# Patient Record
Sex: Male | Born: 1950 | Race: White | Hispanic: No | Marital: Single | Smoking: Current every day smoker
Health system: Southern US, Community
[De-identification: ages and names within clinical notes are randomized; demographics above are authoritative.]

## PROBLEM LIST (undated history)

## (undated) DIAGNOSIS — K766 Portal hypertension: Secondary | ICD-10-CM

## (undated) DIAGNOSIS — I779 Disorder of arteries and arterioles, unspecified: Secondary | ICD-10-CM

## (undated) DIAGNOSIS — Z5900 Homelessness unspecified: Secondary | ICD-10-CM

## (undated) DIAGNOSIS — G459 Transient cerebral ischemic attack, unspecified: Secondary | ICD-10-CM

## (undated) DIAGNOSIS — F4310 Post-traumatic stress disorder, unspecified: Secondary | ICD-10-CM

## (undated) DIAGNOSIS — I272 Pulmonary hypertension, unspecified: Secondary | ICD-10-CM

## (undated) DIAGNOSIS — I700 Atherosclerosis of aorta: Secondary | ICD-10-CM

## (undated) DIAGNOSIS — R001 Bradycardia, unspecified: Secondary | ICD-10-CM

## (undated) DIAGNOSIS — K7460 Unspecified cirrhosis of liver: Secondary | ICD-10-CM

## (undated) DIAGNOSIS — E119 Type 2 diabetes mellitus without complications: Secondary | ICD-10-CM

## (undated) DIAGNOSIS — E785 Hyperlipidemia, unspecified: Secondary | ICD-10-CM

## (undated) DIAGNOSIS — K439 Ventral hernia without obstruction or gangrene: Secondary | ICD-10-CM

## (undated) DIAGNOSIS — R601 Generalized edema: Secondary | ICD-10-CM

## (undated) DIAGNOSIS — G8929 Other chronic pain: Secondary | ICD-10-CM

## (undated) DIAGNOSIS — E669 Obesity, unspecified: Secondary | ICD-10-CM

## (undated) DIAGNOSIS — J45909 Unspecified asthma, uncomplicated: Secondary | ICD-10-CM

## (undated) DIAGNOSIS — R296 Repeated falls: Secondary | ICD-10-CM

## (undated) DIAGNOSIS — I5030 Unspecified diastolic (congestive) heart failure: Secondary | ICD-10-CM

## (undated) DIAGNOSIS — J449 Chronic obstructive pulmonary disease, unspecified: Secondary | ICD-10-CM

## (undated) DIAGNOSIS — M51369 Other intervertebral disc degeneration, lumbar region without mention of lumbar back pain or lower extremity pain: Secondary | ICD-10-CM

## (undated) DIAGNOSIS — R079 Chest pain, unspecified: Secondary | ICD-10-CM

## (undated) DIAGNOSIS — R45851 Suicidal ideations: Secondary | ICD-10-CM

## (undated) DIAGNOSIS — I2510 Atherosclerotic heart disease of native coronary artery without angina pectoris: Secondary | ICD-10-CM

## (undated) DIAGNOSIS — K219 Gastro-esophageal reflux disease without esophagitis: Secondary | ICD-10-CM

## (undated) DIAGNOSIS — B1920 Unspecified viral hepatitis C without hepatic coma: Secondary | ICD-10-CM

## (undated) DIAGNOSIS — G43909 Migraine, unspecified, not intractable, without status migrainosus: Secondary | ICD-10-CM

## (undated) DIAGNOSIS — I10 Essential (primary) hypertension: Secondary | ICD-10-CM

## (undated) DIAGNOSIS — D649 Anemia, unspecified: Secondary | ICD-10-CM

## (undated) DIAGNOSIS — M5412 Radiculopathy, cervical region: Secondary | ICD-10-CM

## (undated) DIAGNOSIS — F32A Depression, unspecified: Secondary | ICD-10-CM

## (undated) DIAGNOSIS — G4730 Sleep apnea, unspecified: Secondary | ICD-10-CM

## (undated) DIAGNOSIS — D696 Thrombocytopenia, unspecified: Secondary | ICD-10-CM

## (undated) DIAGNOSIS — B888 Other specified infestations: Secondary | ICD-10-CM

## (undated) DIAGNOSIS — M1990 Unspecified osteoarthritis, unspecified site: Secondary | ICD-10-CM

## (undated) DIAGNOSIS — Z765 Malingerer [conscious simulation]: Secondary | ICD-10-CM

## (undated) DIAGNOSIS — B1910 Unspecified viral hepatitis B without hepatic coma: Secondary | ICD-10-CM

## (undated) DIAGNOSIS — N200 Calculus of kidney: Secondary | ICD-10-CM

## (undated) DIAGNOSIS — G992 Myelopathy in diseases classified elsewhere: Secondary | ICD-10-CM

## (undated) DIAGNOSIS — I509 Heart failure, unspecified: Secondary | ICD-10-CM

## (undated) DIAGNOSIS — F419 Anxiety disorder, unspecified: Secondary | ICD-10-CM

## (undated) DIAGNOSIS — K838 Other specified diseases of biliary tract: Secondary | ICD-10-CM

## (undated) DIAGNOSIS — M4802 Spinal stenosis, cervical region: Secondary | ICD-10-CM

## (undated) HISTORY — PX: CHOLECYSTECTOMY: SHX55

## (undated) HISTORY — PX: ANTERIOR CERVICAL DISCECTOMY: SHX1160

## (undated) HISTORY — PX: CARDIAC CATHETERIZATION: SHX172

## (undated) HISTORY — DX: Unspecified viral hepatitis C without hepatic coma: B19.20

## (undated) HISTORY — DX: Post-traumatic stress disorder, unspecified: F43.10

## (undated) HISTORY — DX: Repeated falls: R29.6

## (undated) HISTORY — DX: Unspecified viral hepatitis B without hepatic coma: B19.10

## (undated) HISTORY — DX: Type 2 diabetes mellitus without complications: E11.9

## (undated) HISTORY — DX: Depression, unspecified: F32.A

## (undated) HISTORY — DX: Suicidal ideations: R45.851

## (undated) HISTORY — DX: Anxiety disorder, unspecified: F41.9

## (undated) HISTORY — DX: Unspecified cirrhosis of liver: K74.60

## (undated) HISTORY — DX: Other chronic pain: G89.29

## (undated) HISTORY — DX: Bradycardia, unspecified: R00.1

## (undated) HISTORY — DX: Radiculopathy, cervical region: M54.12

## (undated) HISTORY — DX: Unspecified diastolic (congestive) heart failure: I50.30

## (undated) HISTORY — PX: INNER EAR SURGERY: SHX679

## (undated) HISTORY — PX: EYE SURGERY: SHX253

## (undated) HISTORY — PX: NOSE SURGERY: SHX723

## (undated) HISTORY — DX: Chest pain, unspecified: R07.9

## (undated) HISTORY — PX: APPENDECTOMY: SHX54

---

## 2006-10-21 DIAGNOSIS — I2510 Atherosclerotic heart disease of native coronary artery without angina pectoris: Secondary | ICD-10-CM

## 2006-10-21 HISTORY — DX: Atherosclerotic heart disease of native coronary artery without angina pectoris: I25.10

## 2017-12-16 ENCOUNTER — Emergency Department
Admission: EM | Admit: 2017-12-16 | Discharge: 2017-12-16 | Disposition: A | Discharge status: Home / Self Care | Payer: Medicare Other | Attending: Emergency Medicine | Admitting: Emergency Medicine

## 2017-12-16 ENCOUNTER — Encounter: Payer: Self-pay | Admitting: Emergency Medicine

## 2017-12-16 ENCOUNTER — Other Ambulatory Visit: Payer: Self-pay

## 2017-12-16 ENCOUNTER — Emergency Department: Payer: Medicare Other

## 2017-12-16 DIAGNOSIS — R600 Localized edema: Secondary | ICD-10-CM | POA: Diagnosis not present

## 2017-12-16 DIAGNOSIS — I509 Heart failure, unspecified: Secondary | ICD-10-CM | POA: Insufficient documentation

## 2017-12-16 DIAGNOSIS — R609 Edema, unspecified: Secondary | ICD-10-CM

## 2017-12-16 DIAGNOSIS — Z5900 Homelessness unspecified: Secondary | ICD-10-CM

## 2017-12-16 DIAGNOSIS — F4323 Adjustment disorder with mixed anxiety and depressed mood: Secondary | ICD-10-CM

## 2017-12-16 DIAGNOSIS — Z5902 Unsheltered homelessness: Secondary | ICD-10-CM

## 2017-12-16 DIAGNOSIS — R2240 Localized swelling, mass and lump, unspecified lower limb: Secondary | ICD-10-CM | POA: Diagnosis present

## 2017-12-16 HISTORY — DX: Heart failure, unspecified: I50.9

## 2017-12-16 LAB — TROPONIN I: Troponin I: <0.03 ng/mL (ref <0.03)

## 2017-12-16 LAB — BASIC METABOLIC PANEL
Anion gap: 7 (ref 5–15)
BUN: 16 mg/dL (ref 6–20)
CHLORIDE: 105 mmol/L (ref 101–111)
CO2: 25 mmol/L (ref 22–32)
Calcium: 8.8 mg/dL — ABNORMAL LOW (ref 8.9–10.3)
Creatinine, Ser: 1.29 mg/dL — ABNORMAL HIGH (ref 0.61–1.24)
GFR, EST NON AFRICAN AMERICAN: 55 mL/min — AB (ref >60)
Glucose, Bld: 107 mg/dL — ABNORMAL HIGH (ref 65–99)
POTASSIUM: 3.2 mmol/L — AB (ref 3.5–5.1)
SODIUM: 137 mmol/L (ref 135–145)

## 2017-12-16 LAB — CBC
HEMATOCRIT: 33.1 % — AB (ref 40.0–52.0)
HEMOGLOBIN: 11.3 g/dL — AB (ref 13.0–18.0)
MCH: 30.9 pg (ref 26.0–34.0)
MCHC: 34.2 g/dL (ref 32.0–36.0)
MCV: 90.3 fL (ref 80.0–100.0)
Platelets: 125 10*3/uL — ABNORMAL LOW (ref 150–440)
RBC: 3.66 MIL/uL — AB (ref 4.40–5.90)
RDW: 14.5 % (ref 11.5–14.5)
WBC: 4.4 10*3/uL (ref 3.8–10.6)

## 2017-12-16 LAB — BRAIN NATRIURETIC PEPTIDE: B NATRIURETIC PEPTIDE 5: 202 pg/mL — AB (ref 0.0–100.0)

## 2017-12-16 MED ORDER — POTASSIUM CHLORIDE ER 20 MEQ PO TBCR: 10.0000 meq | EXTENDED_RELEASE_TABLET | Freq: Every day | ORAL | 0 refills | Status: DC

## 2017-12-16 MED ORDER — FUROSEMIDE 40 MG PO TABS: 40.0000 mg | ORAL_TABLET | Freq: Every day | ORAL | 0 refills | Status: DC

## 2017-12-16 MED ORDER — FUROSEMIDE 40 MG PO TABS
40.0000 mg | ORAL_TABLET | Freq: Once | ORAL | Status: AC
  Administered 2017-12-16: 40 mg via ORAL
  Filled 2017-12-16: qty 1

## 2017-12-16 NOTE — ED Notes (Signed)
RN walked with pt to lobby to show pt where the phone was located. Pt will call friend who brought him to the ED. Pt in NAD at this time. Pt refused wheelchair and reported, "If they are going to make me leave I will tough it out and walk"

## 2017-12-16 NOTE — Clinical Social Work Note (Signed)
CSW received consult for "ed." CSW staffed with EDP Dr. Burlene Arnt. This CSW and CSW Candace met with patient at bedside. Patient recently released from prison after serving 9-10 years and states he has been living on the streets since. Patient states his parents, his first wife, and most of his friends are deceased. Patient states he has a daughter who has gotten married and he cannot find. Patient states his parole officer is Officer Reed, [place] [phone]. [phone] / [phone]). Patient states he has no income, no insurance, and no PCP. Patient states he cannot stay at the shelter because he is a felon. Patient states officer Verdene Rio was assisting patient with getting into [facility] until he gets his SSI reinstated. Patient states he has a friend that he cannot stay with, but visits with sometimes. CSW discussed plan for patient to reinstate SSI by going to the [employer] on [address]. CSW also discussed patient going to Social Services to apply for food stamps. CSW provided an application for [employer]. CSW provided local resources including addresses and contact information for SSA and Social Services. CSW also provided patient with a elderly resources brochure, [place] transit map, [place] and [place] list, and free meals information in [place]. Patient began providing excuses of why he cannot go to [facility], or [facility] to include he has a curfew and has to be in by 6pm, he cannot leave the county, and he can hardly walk. CSW explained to patient that, per EDP, he would not be admitted to hospital and would be discharging today. Patient then stated he would cut his wrists because he needs to be back in the inpatient [facility]. CSW updated RN Larene Beach and EDP Dr. Burlene Arnt of this information, who put in psych consult. Patient has resources. CSW signing off as no further Social Work needs identified.   Oretha Ellis,  Latanya Presser, [place] Social Worker-ED [phone]

## 2017-12-16 NOTE — ED Provider Notes (Addendum)
[facility] Emergency Department Provider Note  ____________________________________________   I have reviewed the triage vital signs and the nursing notes. Where available I have reviewed prior notes and, if possible and indicated, outside hospital notes.    HISTORY  Chief Complaint Leg Swelling    HPI [name] is a 67 y.o. male history of CHF, states he is compliant with his medication takes 40 of Lasix a day, states he has had shortness of breath when laying flat chronically, also states he has had lower extremity swelling for 6 months.  Patient recently discharged from jail.  He has no vertigo.  He is not allowed apparently into the shelter because he is a felon.  He has been sleeping rough he states.  He wants to be admitted to the hospital.  He states that his leg swelling is not significantly different from baseline although may be somewhat increased.  He denies any chest pain or nausea or vomiting.  He denies exertional symptoms.    Past Medical History:  Diagnosis Date  . CHF (congestive heart failure) ([place])     There are no active problems to display for this patient.   History reviewed. No pertinent surgical history.  Prior to Admission medications   Not on File    Allergies Sulfa antibiotics  No family history on file.  Social History Social History   Tobacco Use  . Smoking status: Unknown If Ever Smoked  . Smokeless tobacco: Never Used  Substance Use Topics  . Alcohol use: Not Currently  . Drug use: Not Currently    Review of Systems Constitutional: No fever/chills Eyes: No visual changes. ENT: No sore throat. No stiff neck no neck pain Cardiovascular: Denies chest pain. Respiratory: Denies shortness of breath. Gastrointestinal:   no vomiting.  No diarrhea.  No constipation. Genitourinary: Negative for dysuria. Musculoskeletal: + lower extremity swelling Skin: Negative for rash. Neurological: Negative for severe  headaches, focal weakness or numbness.   ____________________________________________   PHYSICAL EXAM:  VITAL SIGNS: ED Triage Vitals  Enc Vitals Group     BP 12/16/17 1000 (!) 132/55     Pulse Rate 12/16/17 1000 61     Resp 12/16/17 1000 20     Temp 12/16/17 1000 98.4 F (36.9 C)     Temp Source 12/16/17 1000 Oral     SpO2 12/16/17 1000 99 %     Weight 12/16/17 1002 228 lb (103.4 kg)     Height 12/16/17 1002 5\' 6"  (1.676 m)     Head Circumference --      Peak Flow --      Pain Score --      Pain Loc --      Pain Edu? --      Excl. in [place]? --     Constitutional: Alert and oriented. Well appearing and in no acute distress. Eyes: Conjunctivae are normal Head: Atraumatic HEENT: No congestion/rhinnorhea. Mucous membranes are moist.  Oropharynx non-erythematous Neck:   Nontender with no meningismus, no masses, no stridor Cardiovascular: Normal rate, regular rhythm. Grossly normal heart sounds.  Good peripheral circulation. Respiratory: Normal respiratory effort.  No retractions. Lungs CTAB. Abdominal: Soft and nontender. No distention. No guarding no rebound Back:  There is no focal tenderness or step off.  there is no midline tenderness there are no lesions noted. there is no CVA tenderness Musculoskeletal: No lower extremity tenderness, no upper extremity tenderness. No joint effusions, no DVT signs strong distal pulses chronic appearing bilateral  lower extremity edema Neurologic:  Normal speech and language. No gross focal neurologic deficits are appreciated.  Skin:  Skin is warm, dry and intact. No rash noted. Psychiatric: Mood and affect are normal. Speech and behavior are normal.  ____________________________________________   LABS (all labs ordered are listed, but only abnormal results are displayed)  Labs Reviewed  BASIC METABOLIC PANEL - Abnormal; Notable for the following components:      Result Value   Potassium 3.2 (*)    Glucose, Bld 107 (*)    Creatinine,  Ser 1.29 (*)    Calcium 8.8 (*)    GFR calc non Af Amer 55 (*)    All other components within normal limits  CBC - Abnormal; Notable for the following components:   RBC 3.66 (*)    Hemoglobin 11.3 (*)    HCT 33.1 (*)    Platelets 125 (*)    All other components within normal limits  BRAIN NATRIURETIC PEPTIDE - Abnormal; Notable for the following components:   B Natriuretic Peptide 202.0 (*)    All other components within normal limits  TROPONIN I    Pertinent labs  results that were available during my care of the patient were reviewed by me and considered in my medical decision making (see chart for details). ____________________________________________  EKG  I personally interpreted any EKGs ordered by me or triage And his rhythm at 62 bpm no acute ischemic changes normal axis ____________________________________________  RADIOLOGY  Pertinent labs & imaging results that were available during my care of the patient were reviewed by me and considered in my medical decision making (see chart for details). If possible, patient and/or family made aware of any abnormal findings.  Dg Chest 2 View  Result Date: 12/16/2017 CLINICAL DATA:  Shortness of breath EXAM: CHEST - 2 VIEW COMPARISON:  None. FINDINGS: Lungs are clear. Heart size and pulmonary vascularity are normal. No adenopathy. There is mild degenerative change in the thoracic spine. IMPRESSION: No edema or consolidation. Electronically Signed   By: Lowella Grip III M.D.   On: 12/16/2017 09:47   ____________________________________________    PROCEDURES  Procedure(s) performed: None  Procedures  Critical Care performed: None  ____________________________________________   INITIAL IMPRESSION / ASSESSMENT AND PLAN / ED COURSE  Pertinent labs & imaging results that were available during my care of the patient were reviewed by me and considered in my medical decision making (see chart for details).  Patient here  for chronic leg swelling, does not appear to be acutely ill, seems to mostly be interested in finding a place to stay, we will give him Lasix for his chronic lower extremity edema but chest x-ray, blood work, EKG etc. do not denote any acute pathology today.  We will have social work work with him on finding somewhere to go he does have all of his medications.  ----------------------------------------- 3:22 PM on 12/16/2017 -----------------------------------------  Social work is working on referring patients to places he can stay, no indication at this time for acute admission to the hospital, we have advised him to use extra Lasix and will prescribe him with a prescription for said in case he runs out, outpatient follow-up and return precautions given and understood.  ----------------------------------------- 3:52 PM on 12/16/2017 -----------------------------------------  Being informed by social work that he cannot stay in the hospital without clear medical indication, patient told him apparently that he would harm himself if we did not let him stay here.  This is not likely to be  a real threat, patient seems to be manipulating ascended forcing himself into the hospital so he does not have to be discharged to shelter or with another outpatient facility he wishes to go to, we will see if psychiatry can evaluate him quickly prior to discharge to see if there is any legitimacy to this threat which does appear to be purely manipulative, he had no SI until he was informed that he was not going to be admitted to the hospital  ----------------------------------------- 4:50 PM on 12/16/2017 -----------------------------------------  Pt assessed by dr. Weber Cooks of psychiatry who feels he is safe for d.c.     ____________________________________________   FINAL CLINICAL IMPRESSION(S) / ED DIAGNOSES  Final diagnoses:  None      This chart was dictated using voice recognition software.   Despite best efforts to proofread,  errors can occur which can change meaning.      Schuyler Amor, MD 12/16/17 1402    Schuyler Amor, MD 12/16/17 1950    Schuyler Amor, MD 12/16/17 1553    Schuyler Amor, MD 12/16/17 1650

## 2017-12-16 NOTE — Discharge Instructions (Signed)
Please take an extra Lasix pill every other day until you reduce your lower extremity swelling.  If you have chest pain or shortness of breath or other new or worrisome symptoms return to the emergency department.  Will closely with primary care doctor, and cardiology

## 2017-12-16 NOTE — ED Notes (Signed)
Pt has all cloths and green book bag with all medications. Pt verbalized having all belongings at time of discharge. No belongings left in room upon assessment.

## 2017-12-16 NOTE — ED Triage Notes (Addendum)
Pt to ed with c/o increased swelling in bilat lower ext and up into abd x 6 months. Also reports sob worse with laying flat.

## 2017-12-16 NOTE — Consult Note (Signed)
[facility] Face-to-Face Psychiatry Consult   Reason for Consult: Consult for 67 year old man who came to the hospital for peripheral edema but later made statements implying suicidality. Referring Physician: Burlene Arnt Patient Identification: [name] MRN:  [id number] Principal Diagnosis: Adjustment disorder with mixed anxiety and depressed mood Diagnosis:   Patient Active Problem List   Diagnosis Date Noted  . Adjustment disorder with mixed anxiety and depressed mood [F43.23] 12/16/2017  . Homelessness [Z59.0] 12/16/2017    Total Time spent with patient: 1 hour  Subjective:   [name] is a 67 y.o. male patient admitted with "the system is rigged against me".  HPI: Patient interviewed chart reviewed.  Spoke with emergency room physician.  This 67 year old man who was recently released from prison came to the emergency room today complaining of peripheral edema.  This was assessed and treatment plans were made by the emergency room physician who did not feel that the patient needed inpatient hospitalization.  Reportedly the patient then declared that he was homeless and requested assistance from social work.  When social work provided what assistance they could and also determined that there was no need for the patient to be kept in the hospital the patient reportedly said that he would kill himself.  On interview with me the patient talks at great length about the hardships he has suffered since being released from prison.  He has no family and no place to stay.  He has been staying with a friend for a few days but also staying outdoors at times.  He has lacked any transportation to get to the [employer] office.  Lacks any income.  Patient indicates that his mood stays irritated most of the time.  Difficulty sleeping because of his stress.  Patient talks about at times having thoughts of cutting his wrists but clearly indicates that this would just be done to protest how angry he is at the  injustice of the system.  Does not indicate that he actually wants to die.  Does not indicate any suicidal ideas.  Social history: Patient just out of prison after what sounds like about 9 years.  He indicates that his wife and daughter died while he was in prison and he has no family.  Medical history: History of congestive heart failure.  Substance abuse history: Denies any drug use or alcohol use     Past Psychiatric History: Patient reports that prior to going to prison he had seen an outpatient psychiatrist and had had hospitalizations for what he terms "nervous breakdowns".  Cannot remember what medicines he took.  Denies any history of suicide attempts or violence.  Denies any history of psychosis.  States that he was not treated for any mental health problems while in prison and even though he says he has panic attacks and PTSD  Risk to Self: Is patient at risk for suicide?: No Risk to Others:   Prior Inpatient Therapy:   Prior Outpatient Therapy:    Past Medical History:  Past Medical History:  Diagnosis Date  . CHF (congestive heart failure) ([place])    History reviewed. No pertinent surgical history. Family History: No family history on file. Family Psychiatric  History: No known family history Social History:  Social History   Substance and Sexual Activity  Alcohol Use Not Currently     Social History   Substance and Sexual Activity  Drug Use Not Currently    Social History   Socioeconomic History  . Marital status: Single  Spouse name: Not on file  . Number of children: Not on file  . Years of education: Not on file  . Highest education level: Not on file  Occupational History  . Not on file  Social Needs  . Financial resource strain: Not on file  . Food insecurity:    Worry: Not on file    Inability: Not on file  . Transportation needs:    Medical: Not on file    Non-medical: Not on file  Tobacco Use  . Smoking status: Unknown If Ever Smoked  .  Smokeless tobacco: Never Used  Substance and Sexual Activity  . Alcohol use: Not Currently  . Drug use: Not Currently  . Sexual activity: Not on file  Lifestyle  . Physical activity:    Days per week: Not on file    Minutes per session: Not on file  . Stress: Not on file  Relationships  . Social connections:    Talks on phone: Not on file    Gets together: Not on file    Attends religious service: Not on file    Active member of club or organization: Not on file    Attends meetings of clubs or organizations: Not on file    Relationship status: Not on file  Other Topics Concern  . Not on file  Social History Narrative  . Not on file   Additional Social History:    Allergies:   Allergies  Allergen Reactions  . Sulfa Antibiotics     Labs:  Results for orders placed or performed during the hospital encounter of 12/16/17 (from the past 48 hour(s))  Basic metabolic panel     Status: Abnormal   Collection Time: 12/16/17 10:06 AM  Result Value Ref Range   Sodium 137 135 - 145 mmol/L   Potassium 3.2 (L) 3.5 - 5.1 mmol/L   Chloride 105 101 - 111 mmol/L   CO2 25 22 - 32 mmol/L   Glucose, Bld 107 (H) 65 - 99 mg/dL   BUN 16 6 - 20 mg/dL   Creatinine, Ser 1.29 (H) 0.61 - 1.24 mg/dL   Calcium 8.8 (L) 8.9 - 10.3 mg/dL   GFR calc non Af Amer 55 (L) >60 mL/min   GFR calc Af Amer >60 >60 mL/min    Comment: (NOTE) The eGFR has been calculated using the CKD EPI equation. This calculation has not been validated in all clinical situations. eGFR's persistently <60 mL/min signify possible Chronic Kidney Disease.    Anion gap 7 5 - 15    Comment: Performed at [facility], [address] [place], [place] [address]  CBC     Status: Abnormal   Collection Time: 12/16/17 10:06 AM  Result Value Ref Range   WBC 4.4 3.8 - 10.6 K/uL   RBC 3.66 (L) 4.40 - 5.90 MIL/uL   Hemoglobin 11.3 (L) 13.0 - 18.0 g/dL   HCT 33.1 (L) 40.0 - 52.0 %   MCV 90.3 80.0 - 100.0 fL   MCH 30.9 26.0 -  34.0 pg   MCHC 34.2 32.0 - 36.0 g/dL   RDW 14.5 11.5 - 14.5 %   Platelets 125 (L) 150 - 440 K/uL    Comment: Performed at [facility], [address] [place], [place] [address]  Troponin I     Status: None   Collection Time: 12/16/17 10:06 AM  Result Value Ref Range   Troponin I <0.03 <0.03 ng/mL    Comment: Performed at [facility], [place], [place] [address]  Brain natriuretic peptide     Status: Abnormal   Collection Time: 12/16/17 10:06 AM  Result Value Ref Range   B Natriuretic Peptide 202.0 (H) 0.0 - 100.0 pg/mL    Comment: Performed at [facility], [address] [place], [place] [address]    No current facility-administered medications for this encounter.    Current Outpatient Medications  Medication Sig Dispense Refill  . furosemide (LASIX) 40 MG tablet Take 1 tablet (40 mg total) by mouth daily. 30 tablet 0  . potassium chloride 20 MEQ TBCR Take 10 mEq by mouth daily. 10 tablet 0    Musculoskeletal: Strength & Muscle Tone: within normal limits Gait & Station: normal Patient leans: N/A  Psychiatric Specialty Exam: Physical Exam  Nursing note and vitals reviewed. Constitutional: He appears well-developed and well-nourished.  HENT:  Head: Normocephalic and atraumatic.  Eyes: Pupils are equal, round, and reactive to light. Conjunctivae are normal.  Neck: Normal range of motion.  Cardiovascular: Regular rhythm and normal heart sounds.  Respiratory: Effort normal. No respiratory distress.  GI: Soft.  Musculoskeletal: Normal range of motion.  Neurological: He is alert.  Skin: Skin is warm and dry.  Psychiatric: He has a normal mood and affect. His speech is normal and behavior is normal. Thought content normal. His affect is not blunt. Cognition and memory are normal. He expresses impulsivity.    Review of Systems  Constitutional: Negative.   HENT: Negative.   Eyes: Negative.   Respiratory: Negative.    Cardiovascular: Negative.   Gastrointestinal: Negative.   Musculoskeletal: Negative.   Skin: Negative.   Neurological: Negative.   Psychiatric/Behavioral: Positive for depression. Negative for hallucinations, memory loss, substance abuse and suicidal ideas. The patient is nervous/anxious. The patient does not have insomnia.     Blood pressure (!) 126/52, pulse 65, temperature 98.4 F (36.9 C), temperature source Oral, resp. rate 16, height '5\' 6"'  (1.676 m), weight 103.4 kg (228 lb), SpO2 98 %.Body mass index is 36.8 kg/m.  General Appearance: Casual  Eye Contact:  Good  Speech:  Clear and Coherent  Volume:  Normal  Mood:  Anxious and Dysphoric  Affect:  Full Range  Thought Process:  Goal Directed  Orientation:  Full (Time, Place, and Person)  Thought Content:  Logical  Suicidal Thoughts:  No  Homicidal Thoughts:  No  Memory:  Immediate;   Fair Recent;   Fair Remote;   Fair  Judgement:  Fair  Insight:  Fair  Psychomotor Activity:  Decreased  Concentration:  Concentration: Fair  Recall:  [employer] of Knowledge:  Fair  Language:  Fair  Akathisia:  No  Handed:  Right  AIMS (if indicated):     Assets:  Desire for Improvement  ADL's:  Intact  Cognition:  WNL  Sleep:        Treatment Plan Summary: Plan 67 year old man who reports a history of anxiety symptoms.  Today in the emergency room he only began talking about psychiatric symptoms after it had been made clear to him that he would not be admitted to the hospital.  Patient is clear in his history that he feels he needs to be in the hospital because he has no place to stay.  Although he does claim to have symptoms of anxiety and depression he does not indicate that he feels hopeless to the point of wanting to actually kill himself.  No evidence of psychosis.  Patient does not  appear to need inpatient psychiatric hospitalization.  He is aware of mental health resources in the community and will be referred to follow-up if  needed outside the hospital.  As far as the issue of requiring further stay here I do not think he needs inpatient psychiatric treatment.  Disposition: No evidence of imminent risk to self or others at present.   Patient does not meet criteria for psychiatric inpatient admission. Supportive therapy provided about ongoing stressors. Discussed crisis plan, support from social network, calling 911, coming to the Emergency Department, and calling Suicide Hotline.  Alethia Berthold, MD 12/16/2017 5:49 PM

## 2017-12-25 ENCOUNTER — Emergency Department: Payer: Medicare Other

## 2017-12-25 ENCOUNTER — Other Ambulatory Visit: Payer: Self-pay

## 2017-12-25 ENCOUNTER — Emergency Department
Admission: EM | Admit: 2017-12-25 | Discharge: 2017-12-26 | Disposition: A | Discharge status: Home / Self Care | Payer: Medicare Other | Attending: Emergency Medicine | Admitting: Emergency Medicine

## 2017-12-25 DIAGNOSIS — Z590 Homelessness: Secondary | ICD-10-CM | POA: Diagnosis not present

## 2017-12-25 DIAGNOSIS — Y999 Unspecified external cause status: Secondary | ICD-10-CM | POA: Insufficient documentation

## 2017-12-25 DIAGNOSIS — S8002XA Contusion of left knee, initial encounter: Secondary | ICD-10-CM | POA: Insufficient documentation

## 2017-12-25 DIAGNOSIS — Y92513 Shop (commercial) as the place of occurrence of the external cause: Secondary | ICD-10-CM | POA: Insufficient documentation

## 2017-12-25 DIAGNOSIS — Y9389 Activity, other specified: Secondary | ICD-10-CM | POA: Insufficient documentation

## 2017-12-25 DIAGNOSIS — S8992XA Unspecified injury of left lower leg, initial encounter: Secondary | ICD-10-CM | POA: Diagnosis present

## 2017-12-25 DIAGNOSIS — R55 Syncope and collapse: Secondary | ICD-10-CM | POA: Diagnosis not present

## 2017-12-25 DIAGNOSIS — R079 Chest pain, unspecified: Secondary | ICD-10-CM | POA: Insufficient documentation

## 2017-12-25 DIAGNOSIS — X58XXXA Exposure to other specified factors, initial encounter: Secondary | ICD-10-CM | POA: Diagnosis not present

## 2017-12-25 DIAGNOSIS — I509 Heart failure, unspecified: Secondary | ICD-10-CM | POA: Insufficient documentation

## 2017-12-25 LAB — BASIC METABOLIC PANEL
Anion gap: 5 (ref 5–15)
BUN: 23 mg/dL — ABNORMAL HIGH (ref 6–20)
CALCIUM: 9 mg/dL (ref 8.9–10.3)
CO2: 26 mmol/L (ref 22–32)
CREATININE: 1.13 mg/dL (ref 0.61–1.24)
Chloride: 111 mmol/L (ref 101–111)
GFR calc Af Amer: >60 mL/min (ref >60)
GFR calc non Af Amer: >60 mL/min (ref >60)
GLUCOSE: 130 mg/dL — AB (ref 65–99)
Potassium: 3.4 mmol/L — ABNORMAL LOW (ref 3.5–5.1)
Sodium: 142 mmol/L (ref 135–145)

## 2017-12-25 LAB — TROPONIN I: Troponin I: <0.03 ng/mL

## 2017-12-25 LAB — CBC WITH DIFFERENTIAL/PLATELET
Basophils Absolute: 0.1 10*3/uL (ref 0–0.1)
Basophils Relative: 2 %
Eosinophils Absolute: 0.1 10*3/uL (ref 0–0.7)
Eosinophils Relative: 3 %
HCT: 31.5 % — ABNORMAL LOW (ref 40.0–52.0)
Hemoglobin: 10.4 g/dL — ABNORMAL LOW (ref 13.0–18.0)
Lymphocytes Relative: 27 %
Lymphs Abs: 1 10*3/uL (ref 1.0–3.6)
MCH: 30 pg (ref 26.0–34.0)
MCHC: 32.9 g/dL (ref 32.0–36.0)
MCV: 91.2 fL (ref 80.0–100.0)
Monocytes Absolute: 0.6 10*3/uL (ref 0.2–1.0)
Monocytes Relative: 18 %
Neutro Abs: 1.8 10*3/uL (ref 1.4–6.5)
Neutrophils Relative %: 50 %
Platelets: 119 10*3/uL — ABNORMAL LOW (ref 150–440)
RBC: 3.45 MIL/uL — ABNORMAL LOW (ref 4.40–5.90)
RDW: 14.7 % — ABNORMAL HIGH (ref 11.5–14.5)
WBC: 3.6 10*3/uL — ABNORMAL LOW (ref 3.8–10.6)

## 2017-12-25 NOTE — Discharge Instructions (Addendum)
Take all medicines as directed by your doctor.  Return to the ER for worsening symptoms, persistent vomiting, difficulty breathing or other concerns.

## 2017-12-25 NOTE — ED Triage Notes (Signed)
Pt arrived via ems for c/o chest pain - pt is homeless and has been without food and shelter for the past few days - pt is c/o chest pain that feels like pressure and the pain radiates into left arm and left jaw - denies shortness of breath - pt took 2 ntg prior to ems arrival and ems gave 1 ntg and 324 mg of asa - no relief from any intervention - Dr Clearnce Hasten at bedside

## 2017-12-25 NOTE — ED Provider Notes (Signed)
[facility] Emergency Department Provider Note  ___________________________________________   First MD Initiated Contact with Patient 12/25/17 2155     (approximate)  I have reviewed the triage vital signs and the nursing notes.   HISTORY  Chief Complaint Chest Pain   HPI [name] is a 67 y.o. male with a history of CHF who is homeless was presenting to the emergency department today after a near syncopal episode.  He says that he was in a [employer] store when he began to feel weak and lightheaded.  This was associated with a pressure-like sensation in his chest radiating up to his neck.  He took 2 of his own nitro tabs and was given a third nitro by EMS without relief of the pressure sensation.  Also given 324 mg of aspirin.  Patient says that he was taken off his medication which include a blood pressure medication and Lasix up until yesterday when he lost his medications.  He says that he has had a history of cardiac catheterization but is unsure if he was stented.  He thinks that they may be have done a balloon angioplasty but is unable to give exact details.  Denying any shortness of breath at this time.  Says that he was having diarrhea yesterday but none today.  Says that last time it was lunchtime when he ate at home a shelter.  He currently says that he feels like he has pressure in his chest like there is a "sac of flower on my chest."  Patient said that he felt so weak that he fell onto his left knee and is now having anterior left knee pain.  Normally walks with a cane because of dizziness from vertigo and usually takes meclizine for the symptoms.  Past Medical History:  Diagnosis Date  . CHF (congestive heart failure) [facility])     Patient Active Problem List   Diagnosis Date Noted  . Adjustment disorder with mixed anxiety and depressed mood 12/16/2017  . Homelessness 12/16/2017    History reviewed. No pertinent surgical history.  Prior to  Admission medications   Medication Sig Start Date End Date Taking? Authorizing Provider  furosemide (LASIX) 40 MG tablet Take 1 tablet (40 mg total) by mouth daily. 12/16/17 12/16/18  Schuyler Amor, MD  potassium chloride 20 MEQ TBCR Take 10 mEq by mouth daily. 12/16/17   Schuyler Amor, MD    Allergies Sulfa antibiotics  No family history on file.  Social History Social History   Tobacco Use  . Smoking status: Unknown If Ever Smoked  . Smokeless tobacco: Never Used  Substance Use Topics  . Alcohol use: Not Currently  . Drug use: Not Currently    Review of Systems  Constitutional: No fever/chills Eyes: No visual changes. ENT: No sore throat. Cardiovascular: As above Respiratory: Denies shortness of breath. Gastrointestinal: No abdominal pain.  No nausea, no vomiting.  No diarrhea.  No constipation. Genitourinary: Negative for dysuria. Musculoskeletal: Negative for back pain. Skin: Negative for rash. Neurological: Negative for headaches, focal weakness or numbness.   ____________________________________________   PHYSICAL EXAM:  VITAL SIGNS: ED Triage Vitals  Enc Vitals Group     BP --      Pulse --      Resp --      Temp --      Temp src --      SpO2 12/25/17 2158 98 %     Weight 12/25/17 2201 234 lb (106.1  kg)     Height 12/25/17 2201 5\' 6"  (1.676 m)     Head Circumference --      Peak Flow --      Pain Score 12/25/17 2200 9     Pain Loc --      Pain Edu? --      Excl. in [place]? --     Constitutional: Alert and oriented. Well appearing and in no acute distress. Eyes: Conjunctivae are normal.  Head: Atraumatic. Nose: No congestion/rhinnorhea. Mouth/Throat: Mucous membranes are moist.  Neck: No stridor.   Cardiovascular: Normal rate, regular rhythm. Grossly normal heart sounds.  Good peripheral circulation with equal and bilateral dorsalis pedis pulses. Respiratory: Normal respiratory effort.  No retractions. Lungs CTAB. Gastrointestinal: Soft and  nontender. No distention. Musculoskeletal: Moderate bilateral lower extremity edema which the patient says is baseline. Neurologic:  Normal speech and language. No gross focal neurologic deficits are appreciated. Skin:  Skin is warm, dry and intact. No rash noted. Psychiatric: Mood and affect are normal. Speech and behavior are normal.  ____________________________________________   LABS (all labs ordered are listed, but only abnormal results are displayed)  Labs Reviewed  CBC WITH DIFFERENTIAL/PLATELET - Abnormal; Notable for the following components:      Result Value   WBC 3.6 (*)    RBC 3.45 (*)    Hemoglobin 10.4 (*)    HCT 31.5 (*)    RDW 14.7 (*)    Platelets 119 (*)    All other components within normal limits  BASIC METABOLIC PANEL - Abnormal; Notable for the following components:   Potassium 3.4 (*)    Glucose, Bld 130 (*)    BUN 23 (*)    All other components within normal limits  TROPONIN I  TROPONIN I   ____________________________________________  EKG  ED ECG REPORT I, Doran Stabler, the attending physician, personally viewed and interpreted this ECG.   Date: 12/25/2017  EKG Time: 2206  Rate: 50  Rhythm: Sinus bradycardia  Axis: Normal  Intervals:none  ST&T Change: No ST segment elevation or depression.  No abnormal T wave inversion.  ____________________________________________  RADIOLOGY  No acute process found on the chest x-ray nor the left knee x-ray. ____________________________________________   PROCEDURES  Procedure(s) performed:   Procedures  Critical Care performed:   ____________________________________________   INITIAL IMPRESSION / ASSESSMENT AND PLAN / ED COURSE  Pertinent labs & imaging results that were available during my care of the patient were reviewed by me and considered in my medical decision making (see chart for details).  Differential diagnosis includes, but is not limited to, ACS, aortic dissection,  pulmonary embolism, cardiac tamponade, pneumothorax, pneumonia, pericarditis, myocarditis, GI-related causes including esophagitis/gastritis, and musculoskeletal chest wall pain.   As part of my medical decision making, I reviewed the following data within the electronic MEDICAL RECORD NUMBER Notes from prior ED visits  ----------------------------------------- 11:13 PM on 12/25/2017 -----------------------------------------  Patient pending repeat troponin at this time.  So far the patient has had a reassuring chest x-ray as well as knee x-ray.  Reassuring initial lab work.  Patient appears to be baseline bradycardic or just over bradycardic in the 60s as per his last result.  Benign appearance at this time.  Patient to be reevaluated by Dr. Beather Arbour and receive a repeat troponin.  Possible discharge to home if work-up reassuring and patient without further complaint.  No acute finding on the knee x-ray.  Likely contusion. ____________________________________________   FINAL CLINICAL IMPRESSION(S) / ED DIAGNOSES  Near syncope.  Left knee pain.  Chest pain.    NEW MEDICATIONS STARTED DURING THIS VISIT:  New Prescriptions   No medications on file     Note:  This document was prepared using Dragon voice recognition software and may include unintentional dictation errors.     Orbie Pyo, MD 12/25/17 [phone]

## 2017-12-26 DIAGNOSIS — S8002XA Contusion of left knee, initial encounter: Secondary | ICD-10-CM | POA: Diagnosis not present

## 2017-12-26 LAB — TROPONIN I: Troponin I: <0.03 ng/mL (ref <0.03)

## 2017-12-26 NOTE — ED Notes (Signed)
NAD noted at time of D/C. Pt taken to the lobby via wheelchair by this RN. At time of D/C. Pt provided with information regarding free/reduced clinics, medication management, a bus pas, a bus route map, and D/C papers. Pt taken to lobby by this RN, courtesy shuttle notified to pick patient up and drop off at bus stop. Santiago Glad, First RN made aware. Pt is alert and oriented, NAD noted.

## 2017-12-26 NOTE — ED Provider Notes (Signed)
-----------------------------------------   1:58 AM on 12/26/2017 -----------------------------------------  Patient sleeping in no acute distress.  Updated him of repeat negative troponin.  Patient is homeless and states the homeless shelter will not take them because he was not present.  Charge nurse will keep him until 6 AM and give him a bus pass.  Strict return precautions given.  Patient verbalizes understanding and agrees with plan of care.   Paulette Blanch, MD 12/26/17 [phone]

## 2017-12-29 ENCOUNTER — Emergency Department: Payer: Medicare Other

## 2017-12-29 ENCOUNTER — Other Ambulatory Visit: Payer: Self-pay

## 2017-12-29 ENCOUNTER — Encounter: Payer: Self-pay | Admitting: *Deleted

## 2017-12-29 ENCOUNTER — Emergency Department
Admission: EM | Admit: 2017-12-29 | Discharge: 2017-12-30 | Disposition: A | Discharge status: Home / Self Care | Payer: Medicare Other | Attending: Emergency Medicine | Admitting: Emergency Medicine

## 2017-12-29 DIAGNOSIS — I509 Heart failure, unspecified: Secondary | ICD-10-CM | POA: Insufficient documentation

## 2017-12-29 DIAGNOSIS — M79604 Pain in right leg: Secondary | ICD-10-CM | POA: Diagnosis not present

## 2017-12-29 DIAGNOSIS — R609 Edema, unspecified: Secondary | ICD-10-CM

## 2017-12-29 DIAGNOSIS — M79605 Pain in left leg: Secondary | ICD-10-CM | POA: Insufficient documentation

## 2017-12-29 DIAGNOSIS — R600 Localized edema: Secondary | ICD-10-CM

## 2017-12-29 DIAGNOSIS — R2243 Localized swelling, mass and lump, lower limb, bilateral: Secondary | ICD-10-CM | POA: Diagnosis present

## 2017-12-29 LAB — COMPREHENSIVE METABOLIC PANEL
ALBUMIN: 3 g/dL — AB (ref 3.5–5.0)
ALT: 26 U/L (ref 17–63)
ANION GAP: 6 (ref 5–15)
AST: 41 U/L (ref 15–41)
Alkaline Phosphatase: 63 U/L (ref 38–126)
BILIRUBIN TOTAL: 0.9 mg/dL (ref 0.3–1.2)
BUN: 17 mg/dL (ref 6–20)
CO2: 25 mmol/L (ref 22–32)
Calcium: 8.5 mg/dL — ABNORMAL LOW (ref 8.9–10.3)
Chloride: 108 mmol/L (ref 101–111)
Creatinine, Ser: 0.89 mg/dL (ref 0.61–1.24)
GFR calc Af Amer: >60 mL/min (ref >60)
GFR calc non Af Amer: >60 mL/min (ref >60)
GLUCOSE: 151 mg/dL — AB (ref 65–99)
POTASSIUM: 3.3 mmol/L — AB (ref 3.5–5.1)
SODIUM: 139 mmol/L (ref 135–145)
TOTAL PROTEIN: 6.4 g/dL — AB (ref 6.5–8.1)

## 2017-12-29 LAB — URINALYSIS, COMPLETE (UACMP) WITH MICROSCOPIC
BILIRUBIN URINE: NEGATIVE
Bacteria, UA: NONE SEEN
GLUCOSE, UA: NEGATIVE mg/dL
HGB URINE DIPSTICK: NEGATIVE
Ketones, ur: NEGATIVE mg/dL
LEUKOCYTES UA: NEGATIVE
NITRITE: NEGATIVE
Protein, ur: 30 mg/dL — AB
SPECIFIC GRAVITY, URINE: 1.025 (ref 1.005–1.030)
pH: 6 (ref 5.0–8.0)

## 2017-12-29 LAB — CBC WITH DIFFERENTIAL/PLATELET
BASOS PCT: 1 %
Basophils Absolute: 0 10*3/uL (ref 0–0.1)
EOS ABS: 0.1 10*3/uL (ref 0–0.7)
Eosinophils Relative: 4 %
HCT: 31.6 % — ABNORMAL LOW (ref 40.0–52.0)
Hemoglobin: 10.7 g/dL — ABNORMAL LOW (ref 13.0–18.0)
Lymphocytes Relative: 23 %
Lymphs Abs: 0.8 10*3/uL — ABNORMAL LOW (ref 1.0–3.6)
MCH: 30.7 pg (ref 26.0–34.0)
MCHC: 33.8 g/dL (ref 32.0–36.0)
MCV: 90.7 fL (ref 80.0–100.0)
MONO ABS: 0.6 10*3/uL (ref 0.2–1.0)
Monocytes Relative: 18 %
Neutro Abs: 1.8 10*3/uL (ref 1.4–6.5)
Neutrophils Relative %: 54 %
Platelets: 115 10*3/uL — ABNORMAL LOW (ref 150–440)
RBC: 3.49 MIL/uL — ABNORMAL LOW (ref 4.40–5.90)
RDW: 15.3 % — AB (ref 11.5–14.5)
WBC: 3.4 10*3/uL — ABNORMAL LOW (ref 3.8–10.6)

## 2017-12-29 LAB — TROPONIN I: Troponin I: <0.03 ng/mL (ref <0.03)

## 2017-12-29 NOTE — ED Notes (Signed)
Pt attempting to urinate at this time for urine sample

## 2017-12-29 NOTE — ED Provider Notes (Signed)
[facility] Emergency Department Provider Note       Time seen: ----------------------------------------- 9:25 PM on 12/29/2017 -----------------------------------------   I have reviewed the triage vital signs and the nursing notes.  HISTORY   Chief Complaint No chief complaint on file.    HPI [name] is a 67 y.o. male with a history of CHF, homelessness, depression who presents to the ED for diffuse right-sided pain as well as pain from the knees down bilaterally and leg edema.  Patient states she was seen recently diagnosed with heart failure.  He states he is currently homeless and sleeps on the sidewalk.  He denies any recent illness or other complaints at this time.  Past Medical History:  Diagnosis Date  . CHF (congestive heart failure) [facility])     Patient Active Problem List   Diagnosis Date Noted  . Adjustment disorder with mixed anxiety and depressed mood 12/16/2017  . Homelessness 12/16/2017    No past surgical history on file.  Allergies Sulfa antibiotics  Social History Social History   Tobacco Use  . Smoking status: Unknown If Ever Smoked  . Smokeless tobacco: Never Used  Substance Use Topics  . Alcohol use: Not Currently  . Drug use: Not Currently   Review of Systems Constitutional: Negative for fever. Cardiovascular: Negative for chest pain. Respiratory: Negative for shortness of breath. Gastrointestinal: Negative for abdominal pain, vomiting and diarrhea. Musculoskeletal: Positive for right arm and right leg pain, peripheral edema Skin: Negative for rash. Neurological: Negative for headaches, focal weakness or numbness.  All systems negative/normal/unremarkable except as stated in the HPI  ____________________________________________   PHYSICAL EXAM:  VITAL SIGNS: ED Triage Vitals  Enc Vitals Group     BP      Pulse      Resp      Temp      Temp src      SpO2      Weight      Height      Head  Circumference      Peak Flow      Pain Score      Pain Loc      Pain Edu?      Excl. in [place]?    Constitutional: Alert and oriented. Well appearing and in no distress. Eyes: Conjunctivae are normal. Normal extraocular movements. Cardiovascular: Normal rate, regular rhythm. No murmurs, rubs, or gallops. Respiratory: Normal respiratory effort without tachypnea nor retractions. Breath sounds are clear and equal bilaterally. No wheezes/rales/rhonchi. Gastrointestinal: Soft and nontender. Normal bowel sounds Musculoskeletal: Nontender with normal range of motion in extremities.  Bilateral pitting edema is noted, extremity tenderness out of proportion to examination Neurologic:  Normal speech and language. No gross focal neurologic deficits are appreciated.  Skin:  Skin is warm, dry and intact. No rash noted. Psychiatric: Depressed mood and affect ____________________________________________  EKG: Interpreted by me.  Sinus rhythm with a rate of 61 bpm, normal PR interval, normal QRS, normal QT.  ____________________________________________  ED COURSE:  As part of my medical decision making, I reviewed the following data within the [place] History obtained from family if available, nursing notes, [name] chart and ekg, as well as notes from prior ED visits. Patient presented for extremity pain and homelessness, we will assess with labs and imaging as indicated at this time.   Procedures ____________________________________________   LABS (pertinent positives/negatives)  Labs Reviewed  CBC WITH DIFFERENTIAL/PLATELET - Abnormal; Notable for the following components:  Result Value   WBC 3.4 (*)    RBC 3.49 (*)    Hemoglobin 10.7 (*)    HCT 31.6 (*)    RDW 15.3 (*)    Platelets 115 (*)    Lymphs Abs 0.8 (*)    All other components within normal limits  COMPREHENSIVE METABOLIC PANEL - Abnormal; Notable for the following components:   Potassium 3.3 (*)    Glucose,  Bld 151 (*)    Calcium 8.5 (*)    Total Protein 6.4 (*)    Albumin 3.0 (*)    All other components within normal limits  TROPONIN I  URINALYSIS, COMPLETE (UACMP) WITH MICROSCOPIC  CBG MONITORING, ED   ____________________________________________  DIFFERENTIAL DIAGNOSIS   Musculoskeletal pain, peripheral edema, CHF, renal failure, electrolyte abnormality  FINAL ASSESSMENT AND PLAN  Musculoskeletal pain, peripheral edema   Plan: The patient had presented for right arm and right leg pain and homelessness. Patient's labs did not reveal any acute process, ultrasound was negative for DVTs.  He remains homeless and states he does not have any options right now.  He will be cleared to sleep in the lobby until plan can be established tomorrow.   Laurence Aly, MD   Note: This note was generated in part or whole with voice recognition software. Voice recognition is usually quite accurate but there are transcription errors that can and very often do occur. I apologize for any typographical errors that were not detected and corrected.     Earleen Newport, MD 12/29/17 2245

## 2017-12-29 NOTE — ED Notes (Signed)
Pt remains out of room in u/s at this time

## 2017-12-29 NOTE — ED Triage Notes (Signed)
Pt arrives via EMS with c/o right leg pain.

## 2018-03-19 ENCOUNTER — Other Ambulatory Visit: Payer: Self-pay

## 2018-03-19 ENCOUNTER — Observation Stay
Admission: EM | Admit: 2018-03-19 | Discharge: 2018-03-21 | Disposition: A | Discharge status: Home / Self Care | Payer: Medicare Other | Attending: Internal Medicine | Admitting: Internal Medicine

## 2018-03-19 ENCOUNTER — Encounter: Payer: Self-pay | Admitting: Emergency Medicine

## 2018-03-19 DIAGNOSIS — R748 Abnormal levels of other serum enzymes: Secondary | ICD-10-CM | POA: Diagnosis present

## 2018-03-19 DIAGNOSIS — Z882 Allergy status to sulfonamides status: Secondary | ICD-10-CM | POA: Insufficient documentation

## 2018-03-19 DIAGNOSIS — Z6841 Body Mass Index (BMI) 40.0 and over, adult: Secondary | ICD-10-CM | POA: Insufficient documentation

## 2018-03-19 DIAGNOSIS — R079 Chest pain, unspecified: Secondary | ICD-10-CM | POA: Diagnosis present

## 2018-03-19 DIAGNOSIS — I5033 Acute on chronic diastolic (congestive) heart failure: Secondary | ICD-10-CM | POA: Diagnosis not present

## 2018-03-19 DIAGNOSIS — E669 Obesity, unspecified: Secondary | ICD-10-CM | POA: Insufficient documentation

## 2018-03-19 DIAGNOSIS — R0789 Other chest pain: Principal | ICD-10-CM | POA: Insufficient documentation

## 2018-03-19 DIAGNOSIS — R0600 Dyspnea, unspecified: Secondary | ICD-10-CM | POA: Insufficient documentation

## 2018-03-19 DIAGNOSIS — E119 Type 2 diabetes mellitus without complications: Secondary | ICD-10-CM | POA: Insufficient documentation

## 2018-03-19 DIAGNOSIS — J449 Chronic obstructive pulmonary disease, unspecified: Secondary | ICD-10-CM | POA: Insufficient documentation

## 2018-03-19 DIAGNOSIS — Z79899 Other long term (current) drug therapy: Secondary | ICD-10-CM | POA: Diagnosis not present

## 2018-03-19 DIAGNOSIS — Z7984 Long term (current) use of oral hypoglycemic drugs: Secondary | ICD-10-CM | POA: Insufficient documentation

## 2018-03-19 DIAGNOSIS — I509 Heart failure, unspecified: Secondary | ICD-10-CM

## 2018-03-19 DIAGNOSIS — R0609 Other forms of dyspnea: Secondary | ICD-10-CM

## 2018-03-19 DIAGNOSIS — G4730 Sleep apnea, unspecified: Secondary | ICD-10-CM | POA: Diagnosis not present

## 2018-03-19 DIAGNOSIS — Z7982 Long term (current) use of aspirin: Secondary | ICD-10-CM | POA: Diagnosis not present

## 2018-03-19 DIAGNOSIS — I110 Hypertensive heart disease with heart failure: Secondary | ICD-10-CM | POA: Diagnosis not present

## 2018-03-19 DIAGNOSIS — R7989 Other specified abnormal findings of blood chemistry: Secondary | ICD-10-CM

## 2018-03-19 DIAGNOSIS — Z590 Homelessness: Secondary | ICD-10-CM | POA: Insufficient documentation

## 2018-03-19 DIAGNOSIS — R778 Other specified abnormalities of plasma proteins: Secondary | ICD-10-CM

## 2018-03-19 HISTORY — DX: Homelessness unspecified: Z59.00

## 2018-03-19 HISTORY — DX: Migraine, unspecified, not intractable, without status migrainosus: G43.909

## 2018-03-19 HISTORY — DX: Sleep apnea, unspecified: G47.30

## 2018-03-19 HISTORY — DX: Chronic obstructive pulmonary disease, unspecified: J44.9

## 2018-03-19 HISTORY — DX: Essential (primary) hypertension: I10

## 2018-03-19 HISTORY — DX: Obesity, unspecified: E66.9

## 2018-03-19 HISTORY — DX: Type 2 diabetes mellitus without complications: E11.9

## 2018-03-19 HISTORY — DX: Homelessness: Z59.0

## 2018-03-19 NOTE — ED Triage Notes (Signed)
Pt presents to ED from magistrates office by EMS after he was dropped off with severe sudden onset of chest pain. Pt states pain increases when taking a deep breath. Bilateral leg swelling noted. Pt states they are only slightly larger than his baseline. EMS BP145/66 HR 61 98% on RA. Pt did receive 324mg  aspirin and 1 SL nitro spray by EMS

## 2018-03-20 ENCOUNTER — Emergency Department: Payer: Medicare Other

## 2018-03-20 ENCOUNTER — Other Ambulatory Visit: Payer: Self-pay

## 2018-03-20 ENCOUNTER — Encounter: Payer: Self-pay | Admitting: Physician Assistant

## 2018-03-20 ENCOUNTER — Observation Stay (HOSPITAL_BASED_OUTPATIENT_CLINIC_OR_DEPARTMENT_OTHER)
Admit: 2018-03-20 | Discharge: 2018-03-20 | Disposition: A | Discharge status: Home / Self Care | Payer: Medicare Other | Attending: Physician Assistant | Admitting: Physician Assistant

## 2018-03-20 DIAGNOSIS — E8809 Other disorders of plasma-protein metabolism, not elsewhere classified: Secondary | ICD-10-CM

## 2018-03-20 DIAGNOSIS — R079 Chest pain, unspecified: Secondary | ICD-10-CM | POA: Diagnosis not present

## 2018-03-20 DIAGNOSIS — D649 Anemia, unspecified: Secondary | ICD-10-CM | POA: Diagnosis not present

## 2018-03-20 DIAGNOSIS — I340 Nonrheumatic mitral (valve) insufficiency: Secondary | ICD-10-CM

## 2018-03-20 DIAGNOSIS — R0609 Other forms of dyspnea: Secondary | ICD-10-CM | POA: Diagnosis not present

## 2018-03-20 DIAGNOSIS — R0789 Other chest pain: Secondary | ICD-10-CM | POA: Diagnosis not present

## 2018-03-20 LAB — HEMOGLOBIN A1C
Hgb A1c MFr Bld: 5.5 % (ref 4.8–5.6)
Mean Plasma Glucose: 111.15 mg/dL

## 2018-03-20 LAB — GLUCOSE, CAPILLARY
Glucose-Capillary: 84 mg/dL (ref 70–99)
Glucose-Capillary: 88 mg/dL (ref 70–99)
Glucose-Capillary: 78 mg/dL (ref 70–99)
Glucose-Capillary: 87 mg/dL (ref 70–99)

## 2018-03-20 LAB — BASIC METABOLIC PANEL
ANION GAP: 6 (ref 5–15)
BUN: 12 mg/dL (ref 8–23)
CHLORIDE: 113 mmol/L — AB (ref 98–111)
CO2: 24 mmol/L (ref 22–32)
Calcium: 8.6 mg/dL — ABNORMAL LOW (ref 8.9–10.3)
Creatinine, Ser: 0.97 mg/dL (ref 0.61–1.24)
GFR calc Af Amer: >60 mL/min (ref >60)
Glucose, Bld: 91 mg/dL (ref 70–99)
POTASSIUM: 3.4 mmol/L — AB (ref 3.5–5.1)
SODIUM: 143 mmol/L (ref 135–145)

## 2018-03-20 LAB — ECHOCARDIOGRAM COMPLETE
Height: 66 in
WEIGHTICAEL: 4041.6 [oz_av]

## 2018-03-20 LAB — HEPATIC FUNCTION PANEL
ALBUMIN: 2.8 g/dL — AB (ref 3.5–5.0)
ALT: 18 U/L (ref 0–44)
AST: 35 U/L (ref 15–41)
Alkaline Phosphatase: 59 U/L (ref 38–126)
BILIRUBIN DIRECT: 0.3 mg/dL — AB (ref 0.0–0.2)
Indirect Bilirubin: 1 mg/dL — ABNORMAL HIGH (ref 0.3–0.9)
TOTAL PROTEIN: 6.3 g/dL — AB (ref 6.5–8.1)
Total Bilirubin: 1.3 mg/dL — ABNORMAL HIGH (ref 0.3–1.2)

## 2018-03-20 LAB — CBC
HEMATOCRIT: 30.1 % — AB (ref 40.0–52.0)
HEMOGLOBIN: 10.2 g/dL — AB (ref 13.0–18.0)
MCH: 28.4 pg (ref 26.0–34.0)
MCHC: 33.8 g/dL (ref 32.0–36.0)
MCV: 83.9 fL (ref 80.0–100.0)
Platelets: 107 10*3/uL — ABNORMAL LOW (ref 150–440)
RBC: 3.59 MIL/uL — AB (ref 4.40–5.90)
RDW: 16.9 % — AB (ref 11.5–14.5)
WBC: 3.8 10*3/uL (ref 3.8–10.6)

## 2018-03-20 LAB — LIPASE, BLOOD: LIPASE: 35 U/L (ref 11–51)

## 2018-03-20 LAB — TROPONIN I
Troponin I: 0.03 ng/mL (ref <0.03)
Troponin I: 0.03 ng/mL (ref <0.03)
Troponin I: <0.03 ng/mL (ref <0.03)
Troponin I: <0.03 ng/mL (ref <0.03)

## 2018-03-20 LAB — TSH: TSH: 1.522 u[IU]/mL (ref 0.350–4.500)

## 2018-03-20 LAB — BRAIN NATRIURETIC PEPTIDE: B NATRIURETIC PEPTIDE 5: 302 pg/mL — AB (ref 0.0–100.0)

## 2018-03-20 MED ORDER — ENOXAPARIN SODIUM 40 MG/0.4ML ~~LOC~~ SOLN
40.0000 mg | Freq: Two times a day (BID) | SUBCUTANEOUS | Status: DC
  Administered 2018-03-20 – 2018-03-21 (×3): 40 mg via SUBCUTANEOUS
  Filled 2018-03-20 (×3): qty 0.4

## 2018-03-20 MED ORDER — ALUM & MAG HYDROXIDE-SIMETH 200-200-20 MG/5ML PO SUSP: 30.0000 mL | Freq: Four times a day (QID) | ORAL | Status: DC | PRN

## 2018-03-20 MED ORDER — ENOXAPARIN SODIUM 40 MG/0.4ML ~~LOC~~ SOLN: 40.0000 mg | SUBCUTANEOUS | Status: DC

## 2018-03-20 MED ORDER — METOCLOPRAMIDE HCL 5 MG/ML IJ SOLN
10.0000 mg | INTRAMUSCULAR | Status: AC
  Administered 2018-03-20: 10 mg via INTRAVENOUS
  Filled 2018-03-20: qty 2

## 2018-03-20 MED ORDER — ACETAMINOPHEN 325 MG PO TABS
650.0000 mg | ORAL_TABLET | Freq: Four times a day (QID) | ORAL | Status: DC | PRN
  Administered 2018-03-20 – 2018-03-21 (×2): 650 mg via ORAL
  Filled 2018-03-20 (×3): qty 2

## 2018-03-20 MED ORDER — FUROSEMIDE 10 MG/ML IJ SOLN
20.0000 mg | Freq: Once | INTRAMUSCULAR | Status: AC
  Administered 2018-03-20: 20 mg via INTRAVENOUS
  Filled 2018-03-20: qty 4

## 2018-03-20 MED ORDER — POTASSIUM CHLORIDE CRYS ER 10 MEQ PO TBCR
10.0000 meq | EXTENDED_RELEASE_TABLET | Freq: Every day | ORAL | Status: DC
Start: 2018-03-20 — End: 2018-03-22
  Administered 2018-03-20 – 2018-03-21 (×2): 10 meq via ORAL
  Filled 2018-03-20 (×2): qty 1

## 2018-03-20 MED ORDER — INSULIN ASPART 100 UNIT/ML ~~LOC~~ SOLN: 0.0000 [IU] | Freq: Three times a day (TID) | SUBCUTANEOUS | Status: DC

## 2018-03-20 MED ORDER — ACETAMINOPHEN 650 MG RE SUPP
650.0000 mg | Freq: Four times a day (QID) | RECTAL | Status: DC | PRN
Start: 2018-03-20 — End: 2018-03-22

## 2018-03-20 MED ORDER — ONDANSETRON HCL 4 MG/2ML IJ SOLN: 4.0000 mg | Freq: Four times a day (QID) | INTRAMUSCULAR | Status: DC | PRN

## 2018-03-20 MED ORDER — LOPERAMIDE HCL 2 MG PO CAPS: 2.0000 mg | ORAL_CAPSULE | ORAL | Status: DC | PRN

## 2018-03-20 MED ORDER — PANTOPRAZOLE SODIUM 40 MG PO TBEC
40.0000 mg | DELAYED_RELEASE_TABLET | Freq: Every day | ORAL | Status: DC
  Administered 2018-03-20 – 2018-03-21 (×2): 40 mg via ORAL
  Filled 2018-03-20 (×2): qty 1

## 2018-03-20 MED ORDER — DOCUSATE SODIUM 100 MG PO CAPS
100.0000 mg | ORAL_CAPSULE | Freq: Two times a day (BID) | ORAL | Status: DC
  Filled 2018-03-20 (×3): qty 1

## 2018-03-20 MED ORDER — FUROSEMIDE 40 MG PO TABS
40.0000 mg | ORAL_TABLET | Freq: Every day | ORAL | Status: DC
  Administered 2018-03-20 – 2018-03-21 (×2): 40 mg via ORAL
  Filled 2018-03-20 (×2): qty 1

## 2018-03-20 MED ORDER — POTASSIUM CHLORIDE CRYS ER 20 MEQ PO TBCR
40.0000 meq | EXTENDED_RELEASE_TABLET | ORAL | Status: AC
  Administered 2018-03-20: 40 meq via ORAL
  Filled 2018-03-20: qty 2

## 2018-03-20 MED ORDER — ALUMINUM & MAGNESIUM HYDROXIDE 200-200 MG/5ML PO SUSP: 30.0000 mL | Freq: Four times a day (QID) | ORAL | Status: DC | PRN

## 2018-03-20 MED ORDER — ONDANSETRON HCL 4 MG PO TABS: 4.0000 mg | ORAL_TABLET | Freq: Four times a day (QID) | ORAL | Status: DC | PRN

## 2018-03-20 MED ORDER — KETOROLAC TROMETHAMINE 30 MG/ML IJ SOLN
15.0000 mg | Freq: Once | INTRAMUSCULAR | Status: AC
  Administered 2018-03-20: 15 mg via INTRAVENOUS
  Filled 2018-03-20: qty 1

## 2018-03-20 MED ORDER — INSULIN ASPART 100 UNIT/ML ~~LOC~~ SOLN: 0.0000 [IU] | Freq: Every day | SUBCUTANEOUS | Status: DC

## 2018-03-20 MED ORDER — SODIUM CHLORIDE 0.9% FLUSH
3.0000 mL | Freq: Two times a day (BID) | INTRAVENOUS | Status: DC
  Administered 2018-03-20: 3 mL via INTRAVENOUS

## 2018-03-20 NOTE — Care Management Obs Status (Signed)
[place] NOTIFICATION   Patient Details  Name: [name] MRN: [id number] Date of Birth: [birth year]   Medicare Observation Status Notification Given:  Yes    Shelbie Ammons, RN 03/20/2018, 1:50 PM

## 2018-03-20 NOTE — ED Provider Notes (Signed)
[facility] Emergency Department Provider Note  ____________________________________________   First MD Initiated Contact with Patient 03/20/18 0012     (approximate)  I have reviewed the triage vital signs and the nursing notes.   HISTORY  Chief Complaint Chest Pain    HPI [name] is a 67 y.o. male with extensive past medical history as listed below but he was never had an evaluation for coronary artery disease (although he says he may have had a catheterization more than 20 years ago).  He presents by EMS for evaluation of acute onset chest pain and about 4 days of difficulty breathing.  He states that he ran out of his Lasix about 4 days ago and has not been taking any.  He has been getting increasingly short of breath and any amount of exertion makes him unable to breathe.  Tonight he had acute onset of chest pressure and some deep aching pain.  His legs been swelling up more than usual.  He has not been wheezing.  He denies fever/chills, nausea, vomiting, and abdominal pain.  He received a full dose aspirin by EMS and one nitro spray which did not help his chest pain but he gave him a severe headache similar to prior migraines and reports he Artie had a severe migraine.  Nothing particular makes his symptoms better and exertion makes them worse.  Of note he is homeless at this time.  In spite of his COPD history he has no history of tobacco use.  Past Medical History:  Diagnosis Date  . CHF (congestive heart failure) ([place])   . COPD (chronic obstructive pulmonary disease) ([place])   . Diabetes mellitus without complication ([place])   . Hypertension   . Migraine     Patient Active Problem List   Diagnosis Date Noted  . Chest pain 03/20/2018  . Adjustment disorder with mixed anxiety and depressed mood 12/16/2017  . Homelessness 12/16/2017    Past Surgical History:  Procedure Laterality Date  . CARDIAC CATHETERIZATION    . CHOLECYSTECTOMY    . EYE  SURGERY    . INNER EAR SURGERY    . NOSE SURGERY      Prior to Admission medications   Medication Sig Start Date End Date Taking? Authorizing Provider  acetaminophen (TYLENOL) 650 MG CR tablet Take 650 mg by mouth 2 (two) times daily.   Yes [provider]  acetaminophen-codeine (TYLENOL #3) 300-30 MG tablet Take 1 tablet by mouth every 4 (four) hours as needed for moderate pain.   Yes [provider]  albuterol (PROVENTIL HFA;VENTOLIN HFA) 108 (90 Base) MCG/ACT inhaler Inhale 1-2 puffs into the lungs every 6 (six) hours as needed for wheezing or shortness of breath.   Yes [provider]  aluminum-magnesium hydroxide 200-200 MG/5ML suspension Take 30 mLs by mouth every 6 (six) hours as needed for indigestion.   Yes [provider]  furosemide (LASIX) 40 MG tablet Take 1 tablet (40 mg total) by mouth daily. 12/16/17 12/16/18 Yes McShane, Gerda Diss, MD  ibuprofen (ADVIL,MOTRIN) 400 MG tablet Take 400 mg by mouth 2 (two) times daily.   Yes [provider]  loperamide (IMODIUM) 2 MG capsule Take 2 mg by mouth as needed for diarrhea or loose stools.   Yes [provider]  metFORMIN (GLUCOPHAGE-XR) 500 MG 24 hr tablet Take 1,000 mg by mouth 2 (two) times daily.   Yes [provider]  omeprazole (PRILOSEC) 40 MG capsule Take 40 mg by  mouth daily.   Yes [provider]  potassium chloride 20 MEQ TBCR Take 10 mEq by mouth daily. 12/16/17  Yes Schuyler Amor, MD    Allergies Eggs or egg-derived products and Sulfa antibiotics  History reviewed. No pertinent family history.  Social History Social History   Tobacco Use  . Smoking status: Never Smoker  . Smokeless tobacco: Never Used  Substance Use Topics  . Alcohol use: Not Currently  . Drug use: Not Currently    Review of Systems Constitutional: No fever/chills Eyes: No visual changes. ENT: No sore throat. Cardiovascular: Chest pain as described above Respiratory:  Shortness of breath as described above Gastrointestinal: No abdominal pain.  No nausea, no vomiting.  No diarrhea.  No constipation. Genitourinary: Negative for dysuria. Musculoskeletal: Swelling in his legs, gradually worsening over time as described above.  Negative for neck pain.  Negative for back pain. Integumentary: Negative for rash. Neurological: Negative for headaches, focal weakness or numbness.   ____________________________________________   PHYSICAL EXAM:  VITAL SIGNS: ED Triage Vitals  Enc Vitals Group     BP --      Pulse Rate 03/19/18 2356 (!) 59     Resp 03/19/18 2356 20     Temp --      Temp Source 03/19/18 2356 Oral     SpO2 03/19/18 2356 98 %     Weight 03/19/18 2357 108.9 kg (240 lb)     Height 03/19/18 2357 1.676 m (5\' 6" )     Head Circumference --      Peak Flow --      Pain Score 03/19/18 2356 9     Pain Loc --      Pain Edu? --      Excl. in [place]? --     Constitutional: Alert and oriented.  Generally well-appearing but slight increased work of breathing. Eyes: Conjunctivae are normal.  Head: Atraumatic. Nose: No congestion/rhinnorhea. Mouth/Throat: Mucous membranes are moist. Neck: No stridor.  No meningeal signs.   Cardiovascular: Normal rate, regular rhythm. Good peripheral circulation. Grossly normal heart sounds. Respiratory: Moderately increased work of breathing with some intercostal retractions but clear lung sounds throughout, no wheezing and no rales.   Gastrointestinal: Soft and nontender. No distention.  Musculoskeletal: 1+ pitting edema bilaterally. No gross deformities of extremities. Neurologic:  Normal speech and language. No gross focal neurologic deficits are appreciated.  Skin:  Skin is warm, dry and intact. No rash noted. Psychiatric: Mood and affect are normal. Speech and behavior are normal.  ____________________________________________   LABS (all labs ordered are listed, but only abnormal results are displayed)  Labs  Reviewed  BASIC METABOLIC PANEL - Abnormal; Notable for the following components:      Result Value   Potassium 3.4 (*)    Chloride 113 (*)    Calcium 8.6 (*)    All other components within normal limits  CBC - Abnormal; Notable for the following components:   RBC 3.59 (*)    Hemoglobin 10.2 (*)    HCT 30.1 (*)    RDW 16.9 (*)    Platelets 107 (*)    All other components within normal limits  TROPONIN I - Abnormal; Notable for the following components:   Troponin I 0.03 (*)    All other components within normal limits  BRAIN NATRIURETIC PEPTIDE - Abnormal; Notable for the following components:   B Natriuretic Peptide 302.0 (*)    All other components within normal limits  HEPATIC FUNCTION PANEL -  Abnormal; Notable for the following components:   Total Protein 6.3 (*)    Albumin 2.8 (*)    Total Bilirubin 1.3 (*)    Bilirubin, Direct 0.3 (*)    Indirect Bilirubin 1.0 (*)    All other components within normal limits  LIPASE, BLOOD   ____________________________________________  EKG  ED ECG REPORT I, Hinda Kehr, the attending physician, personally viewed and interpreted this ECG.  Date: 03/19/2018 EKG Time: 23: 58 Rate: 60 Rhythm: normal sinus rhythm QRS Axis: normal Intervals: normal ST/T Wave abnormalities: Non-specific ST segment / T-wave changes, but no evidence of acute ischemia. Narrative Interpretation: no evidence of acute ischemia   ____________________________________________  RADIOLOGY I, Hinda Kehr, personally viewed and evaluated these images (plain radiographs) as part of my medical decision making, as well as reviewing the written report by the radiologist.  ED MD interpretation: No evidence of acute intrathoracic abnormality  Official radiology report(s): Dg Chest Portable 1 View  Result Date: 03/20/2018 CLINICAL DATA:  Sudden onset severe chest pain. Pain increases with deep breath. Bilateral leg swelling. History of COPD, congestive heart  failure, diabetes, hypertension, nonsmoker EXAM: PORTABLE CHEST 1 VIEW COMPARISON:  12/25/2017 FINDINGS: Cardiac enlargement. Normal pulmonary vascularity. Lungs are clear and expanded. No blunting of costophrenic angles. No pneumothorax. Mediastinal contours appear intact. Calcified and tortuous aorta. IMPRESSION: Cardiac enlargement. No evidence of active pulmonary disease. Aortic atherosclerosis. Electronically Signed   By: Lucienne Capers M.D.   On: 03/20/2018 00:25    ____________________________________________   PROCEDURES  Critical Care performed: No   Procedure(s) performed:   Procedures   ____________________________________________   INITIAL IMPRESSION / ASSESSMENT AND PLAN / ED COURSE  As part of my medical decision making, I reviewed the following data within the [place] notes reviewed and incorporated, Labs reviewed , EKG interpreted , Old chart reviewed, Radiograph reviewed , Discussed with admitting physician  and Notes from prior ED visits    Differential diagnosis includes, but is not limited to, CHF exacerbation, COPD exacerbation, ACS, pneumothorax, pneumonia.  The patient's EKG is generally reassuring with no evidence of acute ischemia but he has had no cardiac evaluation and he has multiple risk factors.  He has ongoing chest discomfort.  His troponin is slightly elevated over "negative" at 0.03.  While ACS could be the cause, I think it is more likely due to CHF in the setting of nonadherence to his Lasix over the last 4 days.  His BNP is elevated at 302 and he has obvious cardiac enlargement on chest x-ray.  Metabolic panel and CBC are generally reassuring.  Lungs are clear to auscultation.  I am giving him Lasix 20 mg IV as well as treatment for his headache of metoclopramide 10 mg IV and Toradol 50 mg IV.  No indication for head CT.  Discussed with Dr. Jannifer Franklin with the hospitalist service who will admit.      ____________________________________________  FINAL CLINICAL IMPRESSION(S) / ED DIAGNOSES  Final diagnoses:  Chest pain, unspecified type  Acute on chronic congestive heart failure, unspecified heart failure type ([place])  Dyspnea on exertion  Elevated troponin I level     MEDICATIONS GIVEN DURING THIS VISIT:  Medications  potassium chloride SA (K-DUR,KLOR-CON) CR tablet 40 mEq (has no administration in time range)  metoCLOPramide (REGLAN) injection 10 mg (10 mg Intravenous Given 03/20/18 0143)  ketorolac (TORADOL) 30 MG/ML injection 15 mg (15 mg Intravenous Given 03/20/18 0144)  furosemide (LASIX) injection 20 mg (20 mg Intravenous Given  03/20/18 0144)     ED Discharge Orders    None       Note:  This document was prepared using Dragon voice recognition software and may include unintentional dictation errors.    Hinda Kehr, MD 03/20/18 [phone]

## 2018-03-20 NOTE — Consult Note (Signed)
Cardiology Consultation:   Patient ID: [name]; [id number]; 1951-03-13   Admit date: 03/19/2018 Date of Consult: 03/20/2018  Primary Care Provider: Letta Median, MD Primary Cardiologist: New to [facility] - consult by [place]   Patient Profile:   [name] is a 67 y.o. male with a hx of reported CHF without any known details, COPD, DM2, HTN, migraine disorder, obesity, OSA not compliant with CPAP, homelessness, and convicted felon for child pornography who was incarcerated for 12 years and released 11/2017 who is being seen today for the evaluation of SOB and chest pain at the request of Dr. Marcille Blanco.  History of Present Illness:   [name] reports a prior cardiac cath in the 1980s, though does not know the details of this and there are no records for review. He has been incarcerated for the past 12 years as above and was released from prison in 11/2017. He indicates he did not receive health care while incarcerated. He has been seen in the ED 3 times over the months of 4-12/2017 for lower extremity edema and chest pain. His workup each time has been non-acute and he has been discharged. He indicates he is homeless and cannot regularly find a shelter due to his felon status. He eats whatever food he can find. He denies any alcohol, tobacco, or illegal drugs. He reports he ran out of his medications 4-5 days prior. Of note, his reported weight at his first ED visit in 11/2017 was 228 pounds with a current reported weight of 252 pounds. He has continued to note SOB and chest tightness with ambulation. He also notes lower extremity swelling that is stable. He denies any orthopnea, early satiety, PND, or cough. He states "my whole body hurts."  Vitals stable. Labs showed a BNP of 302, troponin peaked at 0.03, albumin 2.8, TSH normal, lipase normal, WBC 3.8, HGB 10.2, PLT 107, CXR with cardiac enlargement with aortic atherosclerosis and no acute cardiopulmonary process. EKG not acute as  below.   Past Medical History:  Diagnosis Date  . COPD (chronic obstructive pulmonary disease) ([place])   . Diabetes mellitus without complication ([place])   . Homelessness   . Hypertension   . Migraine   . Obesity   . Sleep apnea     Past Surgical History:  Procedure Laterality Date  . CARDIAC CATHETERIZATION    . CHOLECYSTECTOMY    . EYE SURGERY    . INNER EAR SURGERY    . NOSE SURGERY       Home Meds: Prior to Admission medications   Medication Sig Start Date End Date Taking? Authorizing Provider  acetaminophen (TYLENOL) 650 MG CR tablet Take 650 mg by mouth 2 (two) times daily.   Yes [provider]  acetaminophen-codeine (TYLENOL #3) 300-30 MG tablet Take 1 tablet by mouth every 4 (four) hours as needed for moderate pain.   Yes [provider]  albuterol (PROVENTIL HFA;VENTOLIN HFA) 108 (90 Base) MCG/ACT inhaler Inhale 1-2 puffs into the lungs every 6 (six) hours as needed for wheezing or shortness of breath.   Yes [provider]  aluminum-magnesium hydroxide 200-200 MG/5ML suspension Take 30 mLs by mouth every 6 (six) hours as needed for indigestion.   Yes [provider]  furosemide (LASIX) 40 MG tablet Take 1 tablet (40 mg total) by mouth daily. 12/16/17 12/16/18 Yes McShane, Gerda Diss, MD  ibuprofen (ADVIL,MOTRIN) 400 MG tablet Take 400 mg by mouth 2 (two) times daily.  Yes [provider]  loperamide (IMODIUM) 2 MG capsule Take 2 mg by mouth as needed for diarrhea or loose stools.   Yes [provider]  metFORMIN (GLUCOPHAGE-XR) 500 MG 24 hr tablet Take 1,000 mg by mouth 2 (two) times daily.   Yes [provider]  omeprazole (PRILOSEC) 40 MG capsule Take 40 mg by mouth daily.   Yes [provider]  potassium chloride 20 MEQ TBCR Take 10 mEq by mouth daily. 12/16/17  Yes Schuyler Amor, MD    Inpatient Medications: Scheduled Meds: . docusate sodium  100 mg Oral BID  . enoxaparin (LOVENOX) injection  40  mg Subcutaneous Q12H  . furosemide  40 mg Oral Daily  . insulin aspart  0-5 Units Subcutaneous QHS  . insulin aspart  0-9 Units Subcutaneous TID WC  . pantoprazole  40 mg Oral Daily  . potassium chloride  10 mEq Oral Daily   Continuous Infusions:  PRN Meds: acetaminophen **OR** acetaminophen, alum & mag hydroxide-simeth, loperamide, ondansetron **OR** ondansetron (ZOFRAN) IV  Allergies:   Allergies  Allergen Reactions  . Eggs Or Egg-Derived Products Nausea And Vomiting  . Sulfa Antibiotics     Social History:   Social History   Socioeconomic History  . Marital status: Single    Spouse name: Not on file  . Number of children: Not on file  . Years of education: Not on file  . Highest education level: Not on file  Occupational History  . Not on file  Social Needs  . Financial resource strain: Not on file  . Food insecurity:    Worry: Not on file    Inability: Not on file  . Transportation needs:    Medical: Not on file    Non-medical: Not on file  Tobacco Use  . Smoking status: Never Smoker  . Smokeless tobacco: Never Used  Substance and Sexual Activity  . Alcohol use: Not Currently  . Drug use: Not Currently  . Sexual activity: Not on file  Lifestyle  . Physical activity:    Days per week: Not on file    Minutes per session: Not on file  . Stress: Not on file  Relationships  . Social connections:    Talks on phone: Not on file    Gets together: Not on file    Attends religious service: Not on file    Active member of club or organization: Not on file    Attends meetings of clubs or organizations: Not on file    Relationship status: Not on file  . Intimate partner violence:    Fear of current or ex partner: Not on file    Emotionally abused: Not on file    Physically abused: Not on file    Forced sexual activity: Not on file  Other Topics Concern  . Not on file  Social History Narrative  . Not on file     Family History:  Family History  Problem  Relation Age of Onset  . Hypertension Mother   . CAD Mother   . CAD Father     ROS:  Review of Systems  Constitutional: Positive for malaise/fatigue. Negative for chills, diaphoresis, fever and weight loss.  HENT: Negative for congestion.   Eyes: Negative for discharge and redness.  Respiratory: Positive for shortness of breath. Negative for cough, hemoptysis, sputum production and wheezing.   Cardiovascular: Positive for chest pain and leg swelling. Negative for palpitations, orthopnea, claudication and PND.  Gastrointestinal: Negative for abdominal  pain, blood in stool, heartburn, melena, nausea and vomiting.  Genitourinary: Negative for hematuria.  Musculoskeletal: Positive for back pain, joint pain, myalgias and neck pain. Negative for falls.  Skin: Negative for rash.  Neurological: Positive for weakness. Negative for dizziness, tingling, tremors, sensory change, speech change, focal weakness and loss of consciousness.  Endo/Heme/Allergies: Does not bruise/bleed easily.  Psychiatric/Behavioral: Negative for substance abuse. The patient is not nervous/anxious.   All other systems reviewed and are negative.     Physical Exam/Data:   Vitals:   03/20/18 0208 03/20/18 0230 03/20/18 0303 03/20/18 0845  BP: (!) 145/57 (!) 143/61 (!) 144/62 139/68  Pulse: 71 60 (!) 55 (!) 57  Resp: 19 20 18 16   Temp: 98 F (36.7 C)  98.6 F (37 C) 98.1 F (36.7 C)  TempSrc: Oral  Oral Oral  SpO2: 97% 99% 98% 99%  Weight:   252 lb 9.6 oz (114.6 kg)   Height:   5\' 6"  (1.676 m)     Intake/Output Summary (Last 24 hours) at 03/20/2018 1201 Last data filed at 03/20/2018 0730 Gross per 24 hour  Intake -  Output 1025 ml  Net -1025 ml   Filed Weights   03/19/18 2357 03/20/18 0303  Weight: 240 lb (108.9 kg) 252 lb 9.6 oz (114.6 kg)   Body mass index is 40.77 kg/m.   Physical Exam: General: Well developed, well nourished, in no acute distress. Head: Normocephalic, atraumatic, sclera  non-icteric, no xanthomas, nares without discharge.  Neck: Negative for carotid bruits. JVD difficult to assess secondary to body habitus. Lungs: Diminished breath sounds bilaterally. Breathing is unlabored. Heart: RRR with S1 S2. II/VI systolic murmur RUSB, no rubs, or gallops appreciated. Abdomen: Soft, non-tender, non-distended with normoactive bowel sounds. No hepatomegaly. No rebound/guarding. No obvious abdominal masses. Msk:  Strength and tone appear normal for age. Extremities: No clubbing or cyanosis. 2+ bilateral pitting edema. Distal pedal pulses are 2+ and equal bilaterally. Neuro: Alert and oriented X 3. No facial asymmetry. No focal deficit. Moves all extremities spontaneously. Psych:  Responds to questions appropriately with a normal affect.   EKG:  The EKG was personally reviewed and demonstrates: NSR, 60 bpm, baseline artifact, no acute st/t changes Telemetry:  Telemetry was personally reviewed and demonstrates: sinus bradycardia, 50s bpm  Weights: Filed Weights   03/19/18 2357 03/20/18 0303  Weight: 240 lb (108.9 kg) 252 lb 9.6 oz (114.6 kg)    Relevant CV Studies: None available for review in Epic  Laboratory Data:  Chemistry Recent Labs  Lab 03/20/18 0006  NA 143  K 3.4*  CL 113*  CO2 24  GLUCOSE 91  BUN 12  CREATININE 0.97  CALCIUM 8.6*  GFRNONAA >60  GFRAA >60  ANIONGAP 6    Recent Labs  Lab 03/20/18 0006  PROT 6.3*  ALBUMIN 2.8*  AST 35  ALT 18  ALKPHOS 59  BILITOT 1.3*   Hematology Recent Labs  Lab 03/20/18 0006  WBC 3.8  RBC 3.59*  HGB 10.2*  HCT 30.1*  MCV 83.9  MCH 28.4  MCHC 33.8  RDW 16.9*  PLT 107*   Cardiac Enzymes Recent Labs  Lab 03/20/18 0006 03/20/18 0315 03/20/18 0851  TROPONINI 0.03* 0.03* <0.03   No results for input(s): TROPIPOC in the last 168 hours.  BNP Recent Labs  Lab 03/20/18 0006  BNP 302.0*    DDimer No results for input(s): DDIMER in the last 168 hours.  Radiology/Studies:  Dg Chest  Portable 1 View  Result Date:  03/20/2018 IMPRESSION: Cardiac enlargement. No evidence of active pulmonary disease. Aortic atherosclerosis. Electronically Signed   By: Lucienne Capers M.D.   On: 03/20/2018 00:25    Assessment and Plan:   1. Chest pain with moderate risk for cardiac etiology: -Currently chest pain free -Has ruled out -Check echo -Schedule Lexiscan Myoview for 7/26 -Patient reports prior cath in the 1980s, details are not clear -Check urine drug screen  2. Dyspnea/lower extremity swelling: -His lower extremity swelling is likely multifactorial including third spacing from his hypoalbuminemia, anemia, morbid obesity, and venous insufficiency  -Cannot rule out CHF, though his BNP is only minimally elevated and his CXR is relatively clear -However, upon reviewing his ED weights, his weight has trended up from a reported weight of 228 pounds in 11/2017 to 252 pounds this admission -Check echo as above -Strict Is and Os -Daily weights -Suspect this is less CHF and more so his comorbid conditions as above -Remains on PO Lasix pending workup  3. Hypokalemia: -Replete to goal > 4.0, ordered by IM  4. Anemia: -Work up per IM  5. Hypoalbuminemia: -As above  6. Morbid obesity/sleep apnea: -Weight loss advised -Cannot afford CPAP, consider case manager consult  7. Homelessness: -Recommend case manager/social work consult   8. Chronic pain: -Per IM   For questions or updates, please contact [place] Please consult [email or web address] for contact info under Cardiology/STEMI.   Signed, Christell Faith, PA-C [place] Pager: [phone] 03/20/2018, 12:01 PM

## 2018-03-20 NOTE — Progress Notes (Signed)
The patient complains of mild chest tightness and shortness breath. His vital signs are stable. Continue current treatment.  Stress test tomorrow.  I discussed with the patient and RN.  Time spent about 20 minutes.

## 2018-03-20 NOTE — H&P (Signed)
[name] is an 67 y.o. male.   Chief Complaint: Chest pain HPI: The patient with past medical history of hypertension, CHF, COPD and diabetes presents to the emergency department complaining of chest pain.  The patient states that his pain is intermittent and is associated with shortness of breath.  He reports some swelling in his legs and admits to not taking his Lasix for a few days.  Due to ongoing albeit intermittent chest pressure the emergency department staff called the hospitalist service for admission.  Past Medical History:  Diagnosis Date  . CHF (congestive heart failure) ([place])   . COPD (chronic obstructive pulmonary disease) ([place])   . Diabetes mellitus without complication ([place])   . Hypertension   . Migraine     Past Surgical History:  Procedure Laterality Date  . CARDIAC CATHETERIZATION    . CHOLECYSTECTOMY    . EYE SURGERY    . INNER EAR SURGERY    . NOSE SURGERY      History reviewed. No pertinent family history. Social History:  reports that he has never smoked. He has never used smokeless tobacco. He reports that he drank alcohol. He reports that he has current or past drug history.  Allergies:  Allergies  Allergen Reactions  . Eggs Or Egg-Derived Products Nausea And Vomiting  . Sulfa Antibiotics     Medications Prior to Admission  Medication Sig Dispense Refill  . acetaminophen (TYLENOL) 650 MG CR tablet Take 650 mg by mouth 2 (two) times daily.    Marland Kitchen acetaminophen-codeine (TYLENOL #3) 300-30 MG tablet Take 1 tablet by mouth every 4 (four) hours as needed for moderate pain.    Marland Kitchen albuterol (PROVENTIL HFA;VENTOLIN HFA) 108 (90 Base) MCG/ACT inhaler Inhale 1-2 puffs into the lungs every 6 (six) hours as needed for wheezing or shortness of breath.    Marland Kitchen aluminum-magnesium hydroxide 200-200 MG/5ML suspension Take 30 mLs by mouth every 6 (six) hours as needed for indigestion.    . furosemide (LASIX) 40 MG tablet Take 1 tablet (40 mg total) by mouth daily. 30  tablet 0  . ibuprofen (ADVIL,MOTRIN) 400 MG tablet Take 400 mg by mouth 2 (two) times daily.    Marland Kitchen loperamide (IMODIUM) 2 MG capsule Take 2 mg by mouth as needed for diarrhea or loose stools.    . metFORMIN (GLUCOPHAGE-XR) 500 MG 24 hr tablet Take 1,000 mg by mouth 2 (two) times daily.    Marland Kitchen omeprazole (PRILOSEC) 40 MG capsule Take 40 mg by mouth daily.    . potassium chloride 20 MEQ TBCR Take 10 mEq by mouth daily. 10 tablet 0    Results for orders placed or performed during the hospital encounter of 03/19/18 (from the past 48 hour(s))  Basic metabolic panel     Status: Abnormal   Collection Time: 03/20/18 12:06 AM  Result Value Ref Range   Sodium 143 135 - 145 mmol/L   Potassium 3.4 (L) 3.5 - 5.1 mmol/L   Chloride 113 (H) 98 - 111 mmol/L   CO2 24 22 - 32 mmol/L   Glucose, Bld 91 70 - 99 mg/dL   BUN 12 8 - 23 mg/dL   Creatinine, Ser 0.97 0.61 - 1.24 mg/dL   Calcium 8.6 (L) 8.9 - 10.3 mg/dL   GFR calc non Af Amer >60 >60 mL/min   GFR calc Af Amer >60 >60 mL/min    Comment: (NOTE) The eGFR has been calculated using the CKD EPI equation. This calculation has not been validated in  all clinical situations. eGFR's persistently <60 mL/min signify possible Chronic Kidney Disease.    Anion gap 6 5 - 15    Comment: Performed at [facility], [address] [place], [place] [address]  CBC     Status: Abnormal   Collection Time: 03/20/18 12:06 AM  Result Value Ref Range   WBC 3.8 3.8 - 10.6 K/uL   RBC 3.59 (L) 4.40 - 5.90 MIL/uL   Hemoglobin 10.2 (L) 13.0 - 18.0 g/dL   HCT 30.1 (L) 40.0 - 52.0 %   MCV 83.9 80.0 - 100.0 fL   MCH 28.4 26.0 - 34.0 pg   MCHC 33.8 32.0 - 36.0 g/dL   RDW 16.9 (H) 11.5 - 14.5 %   Platelets 107 (L) 150 - 440 K/uL    Comment: Performed at [facility], [address] [place], [place] [address]  Troponin I     Status: Abnormal   Collection Time: 03/20/18 12:06 AM  Result Value Ref Range   Troponin I 0.03 (HH) <0.03 ng/mL    Comment:  CRITICAL RESULT CALLED TO, READ BACK BY AND VERIFIED WITH SHERRY ALLISON '@0045'  03/20/18 [facility] Performed at [facility], [address], [place], [place] [address]   Brain natriuretic peptide     Status: Abnormal   Collection Time: 03/20/18 12:06 AM  Result Value Ref Range   B Natriuretic Peptide 302.0 (H) 0.0 - 100.0 pg/mL    Comment: Performed at [facility], [address] [place], [place] [address]  Lipase, blood     Status: None   Collection Time: 03/20/18 12:06 AM  Result Value Ref Range   Lipase 35 11 - 51 U/L    Comment: Performed at [facility], [address] [place], [place] [address]  Hepatic function panel     Status: Abnormal   Collection Time: 03/20/18 12:06 AM  Result Value Ref Range   Total Protein 6.3 (L) 6.5 - 8.1 g/dL   Albumin 2.8 (L) 3.5 - 5.0 g/dL   AST 35 15 - 41 U/L   ALT 18 0 - 44 U/L   Alkaline Phosphatase 59 38 - 126 U/L   Total Bilirubin 1.3 (H) 0.3 - 1.2 mg/dL   Bilirubin, Direct 0.3 (H) 0.0 - 0.2 mg/dL   Indirect Bilirubin 1.0 (H) 0.3 - 0.9 mg/dL    Comment: Performed at [facility], [place], [place] [address]  Troponin I (q 6hr x 3)     Status: Abnormal   Collection Time: 03/20/18  3:15 AM  Result Value Ref Range   Troponin I 0.03 (HH) <0.03 ng/mL    Comment: CRITICAL VALUE NOTED. VALUE IS CONSISTENT WITH PREVIOUSLY REPORTED/CALLED VALUE...[facility] Performed at [facility], [address] [place], [place] [address]   TSH     Status: None   Collection Time: 03/20/18  3:15 AM  Result Value Ref Range   TSH 1.522 0.350 - 4.500 uIU/mL    Comment: Performed by a 3rd Generation assay with a functional sensitivity of <=0.01 uIU/mL. Performed at [facility], [address] [place], [place] [address]    Dg Chest Portable 1 View  Result Date: 03/20/2018 CLINICAL DATA:  Sudden onset severe chest pain. Pain increases with deep breath. Bilateral leg swelling. History of COPD,  congestive heart failure, diabetes, hypertension, nonsmoker EXAM: PORTABLE CHEST 1 VIEW COMPARISON:  12/25/2017 FINDINGS: Cardiac enlargement. Normal pulmonary vascularity. Lungs are clear and expanded. No blunting of costophrenic angles. No  pneumothorax. Mediastinal contours appear intact. Calcified and tortuous aorta. IMPRESSION: Cardiac enlargement. No evidence of active pulmonary disease. Aortic atherosclerosis. Electronically Signed   By: Lucienne Capers M.D.   On: 03/20/2018 00:25    Review of Systems  Constitutional: Negative for chills and fever.  HENT: Negative for sore throat and tinnitus.   Eyes: Negative for blurred vision and redness.  Respiratory: Positive for shortness of breath. Negative for cough.   Cardiovascular: Positive for chest pain. Negative for palpitations, orthopnea and PND.  Gastrointestinal: Negative for abdominal pain, diarrhea, nausea and vomiting.  Genitourinary: Negative for dysuria, frequency and urgency.  Musculoskeletal: Negative for joint pain and myalgias.  Skin: Negative for rash.       No lesions  Neurological: Negative for speech change, focal weakness and weakness.  Endo/Heme/Allergies: Does not bruise/bleed easily.       No temperature intolerance  Psychiatric/Behavioral: Negative for depression and suicidal ideas.    Blood pressure (!) 144/62, pulse (!) 55, temperature 98.6 F (37 C), temperature source Oral, resp. rate 18, height '5\' 6"'  (1.676 m), weight 114.6 kg (252 lb 9.6 oz), SpO2 98 %. Physical Exam  Vitals reviewed. Constitutional: He is oriented to person, place, and time. He appears well-developed and well-nourished. No distress.  HENT:  Head: Normocephalic and atraumatic.  Mouth/Throat: Oropharynx is clear and moist.  Eyes: Pupils are equal, round, and reactive to light. Conjunctivae and EOM are normal.  Neck: Normal range of motion. Neck supple. No JVD present. No tracheal deviation present. No thyromegaly present.  Cardiovascular:  Normal rate, regular rhythm and normal heart sounds. Exam reveals no gallop and no friction rub.  No murmur heard. Respiratory: Effort normal and breath sounds normal. No respiratory distress.  GI: Soft. Bowel sounds are normal. He exhibits no distension. There is no tenderness.  Genitourinary:  Genitourinary Comments: Deferred  Musculoskeletal: Normal range of motion. He exhibits no edema.  Lymphadenopathy:    He has no cervical adenopathy.  Neurological: He is alert and oriented to person, place, and time. No cranial nerve deficit.  Skin: Skin is warm and dry. No rash noted. No erythema.  Psychiatric: He has a normal mood and affect. His behavior is normal. Judgment and thought content normal.     Assessment/Plan This is a 67 year old male admitted for chest pain. 1.  Chest pain: Follow cardiac enzymes.  Monitor telemetry.  Consult cardiology.  Continue Lasix for CHF (uncharacterized in medical record) 2.  Hypertension: Uncontrolled; labetalol as needed 3.  Diabetes mellitus type 2: Sliding scale insulin while hospitalized.  Hold metformin 4.  COPD: Albuterol as needed 5.  DVT prophylaxis: Lovenox 6.  GI prophylaxis: Pantoprazole per home regimen The patient is a full code.  Time spent on admission orders and patient care approximately 45 minutes  Harrie Foreman, MD 03/20/2018, 6:37 AM

## 2018-03-20 NOTE — ED Notes (Signed)
Patient is resting comfortably. 

## 2018-03-20 NOTE — Progress Notes (Signed)
lovenox adjusted to 40mg  BID due to a BMI >40 and crcl >38ml/hr  Ramond Dial, Pharm.D, BCPS Clinical Pharmacist

## 2018-03-20 NOTE — ED Notes (Signed)
Pt states he has been out of his meds for 4 days and is having difficulty getting his medications.

## 2018-03-20 NOTE — Progress Notes (Signed)
*  PRELIMINARY RESULTS* Echocardiogram 2D Echocardiogram has been performed.  Sherrie Sport 03/20/2018, 2:21 PM

## 2018-03-21 ENCOUNTER — Observation Stay (HOSPITAL_BASED_OUTPATIENT_CLINIC_OR_DEPARTMENT_OTHER): Payer: Medicare Other

## 2018-03-21 ENCOUNTER — Telehealth: Payer: Self-pay | Admitting: Cardiovascular Disease

## 2018-03-21 DIAGNOSIS — R0609 Other forms of dyspnea: Secondary | ICD-10-CM | POA: Diagnosis not present

## 2018-03-21 DIAGNOSIS — R0789 Other chest pain: Secondary | ICD-10-CM | POA: Diagnosis not present

## 2018-03-21 LAB — NM MYOCAR MULTI W/SPECT W/WALL MOTION / EF
CHL CUP NUCLEAR SDS: 3
LVDIAVOL: 164 mL (ref 62–150)
LVSYSVOL: 46 mL
NUC STRESS TID: 0.98
Peak HR: 62 {beats}/min
Percent HR: 40 %
Rest HR: 55 {beats}/min
SRS: 4
SSS: 6

## 2018-03-21 LAB — GLUCOSE, CAPILLARY
Glucose-Capillary: 72 mg/dL (ref 70–99)
Glucose-Capillary: 117 mg/dL — ABNORMAL HIGH (ref 70–99)

## 2018-03-21 MED ORDER — SIMVASTATIN 20 MG PO TABS
40.0000 mg | ORAL_TABLET | Freq: Every day | ORAL | Status: DC
Start: 2018-03-21 — End: 2018-03-22
  Administered 2018-03-21: 40 mg via ORAL
  Filled 2018-03-21: qty 2

## 2018-03-21 MED ORDER — FUROSEMIDE 40 MG PO TABS: 40.0000 mg | ORAL_TABLET | Freq: Every day | ORAL | 1 refills | Status: DC

## 2018-03-21 MED ORDER — METFORMIN HCL ER 500 MG PO TB24: 1000.0000 mg | ORAL_TABLET | Freq: Two times a day (BID) | ORAL | 1 refills | Status: DC

## 2018-03-21 MED ORDER — ASPIRIN 81 MG PO CHEW
81.0000 mg | CHEWABLE_TABLET | Freq: Every day | ORAL | Status: DC
Start: 2018-03-21 — End: 2018-03-22
  Administered 2018-03-21: 81 mg via ORAL
  Filled 2018-03-21: qty 1

## 2018-03-21 MED ORDER — ASPIRIN 81 MG PO CHEW: 81.0000 mg | CHEWABLE_TABLET | Freq: Every day | ORAL | 1 refills | Status: DC

## 2018-03-21 MED ORDER — LISINOPRIL 10 MG PO TABS
10.0000 mg | ORAL_TABLET | Freq: Every day | ORAL | Status: DC
  Administered 2018-03-21: 10 mg via ORAL
  Filled 2018-03-21: qty 1

## 2018-03-21 MED ORDER — ACETAMINOPHEN-CODEINE #3 300-30 MG PO TABS
1.0000 | ORAL_TABLET | ORAL | Status: DC | PRN
  Filled 2018-03-21: qty 1

## 2018-03-21 MED ORDER — REGADENOSON 0.4 MG/5ML IV SOLN
0.4000 mg | Freq: Once | INTRAVENOUS | Status: AC
  Administered 2018-03-21: 0.4 mg via INTRAVENOUS

## 2018-03-21 MED ORDER — SIMVASTATIN 40 MG PO TABS: 40.0000 mg | ORAL_TABLET | Freq: Every day | ORAL | 0 refills | Status: DC

## 2018-03-21 MED ORDER — TECHNETIUM TC 99M TETROFOSMIN IV KIT
12.8500 | PACK | Freq: Once | INTRAVENOUS | Status: AC | PRN
  Administered 2018-03-21: 12.85 via INTRAVENOUS

## 2018-03-21 MED ORDER — TECHNETIUM TC 99M TETROFOSMIN IV KIT
32.3700 | PACK | Freq: Once | INTRAVENOUS | Status: AC | PRN
  Administered 2018-03-21: 32.37 via INTRAVENOUS

## 2018-03-21 MED ORDER — POTASSIUM CHLORIDE CRYS ER 10 MEQ PO TBCR: 10.0000 meq | EXTENDED_RELEASE_TABLET | Freq: Every day | ORAL | 0 refills | Status: DC

## 2018-03-21 MED ORDER — LISINOPRIL 10 MG PO TABS: 10.0000 mg | ORAL_TABLET | Freq: Every day | ORAL | 0 refills | Status: DC

## 2018-03-21 NOTE — Discharge Summary (Signed)
[place] [facility] [place] NAME: [name]    MR#:  [id number]  DATE OF BIRTH:  04/30/1951  DATE OF ADMISSION:  03/19/2018   ADMITTING PHYSICIAN: Harrie Foreman, MD  DATE OF DISCHARGE: 03/21/2018  PRIMARY CARE PHYSICIAN: Letta Median, MD   ADMISSION DIAGNOSIS:  Dyspnea on exertion [R06.09] Elevated troponin I level [R74.8] Chest pain, unspecified type [R07.9] Acute on chronic congestive heart failure, unspecified heart failure type ([place]) [I50.9] DISCHARGE DIAGNOSIS:  Active Problems:   Chest pain  SECONDARY DIAGNOSIS:   Past Medical History:  Diagnosis Date  . COPD (chronic obstructive pulmonary disease) ([place])   . Diabetes mellitus without complication ([place])   . Homelessness   . Hypertension   . Migraine   . Obesity   . Sleep apnea    HOSPITAL COURSE:  This is a 67 year old male admitted for chest pain. 1.  Chest pain: normal cardiac enzymes and stress test.    Start aspirin and Zocor. 2. Acute mild diastolic CHF, continue po Lasix. 3.  Hypertension: Continue Lasix and added lisinopril. 4.  Diabetes mellitus type 2: Sliding scale insulin while hospitalized. resume metformin after discharge. 5.  COPD: Albuterol as needed DISCHARGE CONDITIONS:  Stable, discharge today. CONSULTS OBTAINED:  Treatment Team:  Wellington [place], MD Demetrios Loll, MD DRUG ALLERGIES:   Allergies  Allergen Reactions  . Eggs Or Egg-Derived Products Nausea And Vomiting  . Sulfa Antibiotics    DISCHARGE MEDICATIONS:   Allergies as of 03/21/2018      Reactions   Eggs Or Egg-derived Products Nausea And Vomiting   Sulfa Antibiotics       Medication List    STOP taking these medications   ibuprofen 400 MG tablet Commonly known as:  ADVIL,MOTRIN   Potassium Chloride ER 20 MEQ Tbcr Replaced by:  potassium chloride 10 MEQ tablet     TAKE these medications   acetaminophen 650 MG CR tablet Commonly known as:  TYLENOL Take 650 mg by  mouth 2 (two) times daily.   acetaminophen-codeine 300-30 MG tablet Commonly known as:  TYLENOL #3 Take 1 tablet by mouth every 4 (four) hours as needed for moderate pain.   albuterol 108 (90 Base) MCG/ACT inhaler Commonly known as:  PROVENTIL HFA;VENTOLIN HFA Inhale 1-2 puffs into the lungs every 6 (six) hours as needed for wheezing or shortness of breath.   aluminum-magnesium hydroxide 200-200 MG/5ML suspension Take 30 mLs by mouth every 6 (six) hours as needed for indigestion.   aspirin 81 MG chewable tablet Chew 1 tablet (81 mg total) by mouth daily.   furosemide 40 MG tablet Commonly known as:  LASIX Take 1 tablet (40 mg total) by mouth daily.   lisinopril 10 MG tablet Commonly known as:  PRINIVIL,ZESTRIL Take 1 tablet (10 mg total) by mouth daily.   loperamide 2 MG capsule Commonly known as:  IMODIUM Take 2 mg by mouth as needed for diarrhea or loose stools.   metFORMIN 500 MG 24 hr tablet Commonly known as:  GLUCOPHAGE-XR Take 1,000 mg by mouth 2 (two) times daily.   omeprazole 40 MG capsule Commonly known as:  PRILOSEC Take 40 mg by mouth daily.   potassium chloride 10 MEQ tablet Commonly known as:  K-DUR,KLOR-CON Take 1 tablet (10 mEq total) by mouth daily. Start taking on:  03/22/2018 Replaces:  Potassium Chloride ER 20 MEQ Tbcr   simvastatin 40 MG tablet Commonly known as:  ZOCOR Take 1 tablet (40 mg total)  by mouth daily at 6 PM.        DISCHARGE INSTRUCTIONS:  See AVS. If you experience worsening of your admission symptoms, develop shortness of breath, life threatening emergency, suicidal or homicidal thoughts you must seek medical attention immediately by calling 911 or calling your MD immediately  if symptoms less severe.  You Must read complete instructions/literature along with all the possible adverse reactions/side effects for all the Medicines you take and that have been prescribed to you. Take any new Medicines after you have completely  understood and accpet all the possible adverse reactions/side effects.   Please note  You were cared for by a hospitalist during your hospital stay. If you have any questions about your discharge medications or the care you received while you were in the hospital after you are discharged, you can call the unit and asked to speak with the hospitalist on call if the hospitalist that took care of you is not available. Once you are discharged, your primary care physician will handle any further medical issues. Please note that NO REFILLS for any discharge medications will be authorized once you are discharged, as it is imperative that you return to your primary care physician (or establish a relationship with a primary care physician if you do not have one) for your aftercare needs so that they can reassess your need for medications and monitor your lab values.    On the day of Discharge:  VITAL SIGNS:  Blood pressure (!) 147/64, pulse (!) 52, temperature 98.2 F (36.8 C), temperature source Oral, resp. rate 18, height 5\' 6"  (1.676 m), weight 248 lb 3.2 oz (112.6 kg), SpO2 99 %. PHYSICAL EXAMINATION:  GENERAL:  67 y.o.-year-old patient lying in the bed with no acute distress.  EYES: Pupils equal, round, reactive to light and accommodation. No scleral icterus. Extraocular muscles intact.  HEENT: Head atraumatic, normocephalic. Oropharynx and nasopharynx clear.  NECK:  Supple, no jugular venous distention. No thyroid enlargement, no tenderness.  LUNGS: Normal breath sounds bilaterally, no wheezing, rales,rhonchi or crepitation. No use of accessory muscles of respiration.  CARDIOVASCULAR: S1, S2 normal. No murmurs, rubs, or gallops.  ABDOMEN: Soft, non-tender, non-distended. Bowel sounds present. No organomegaly or mass.  EXTREMITIES: No pedal edema, cyanosis, or clubbing.  NEUROLOGIC: Cranial nerves II through XII are intact. Muscle strength 5/5 in all extremities. Sensation intact. Gait not checked.    PSYCHIATRIC: The patient is alert and oriented x 3.  SKIN: No obvious rash, lesion, or ulcer.  DATA REVIEW:   CBC Recent Labs  Lab 03/20/18 0006  WBC 3.8  HGB 10.2*  HCT 30.1*  PLT 107*    Chemistries  Recent Labs  Lab 03/20/18 0006  NA 143  K 3.4*  CL 113*  CO2 24  GLUCOSE 91  BUN 12  CREATININE 0.97  CALCIUM 8.6*  AST 35  ALT 18  ALKPHOS 59  BILITOT 1.3*     Microbiology Results  No results found for this or any previous visit.  RADIOLOGY:  Nm Myocar Multi W/spect W/wall Motion / Ef  Result Date: 03/21/2018  There was no ST segment deviation noted during stress.  No T wave inversion was noted during stress.  The study is normal.  This is a low risk study.  The left ventricular ejection fraction is normal (55-65%).      Management plans discussed with the patient, family and they are in agreement.  CODE STATUS: Full Code   TOTAL TIME TAKING CARE OF THIS  PATIENT: 32 minutes.    Demetrios Loll M.D on 03/21/2018 at 4:00 PM  Between 7am to 6pm - Pager - [phone]  After 6pm go to [email or web address] - Proofreader  [facility]  Office  [phone]  CC: Primary care physician; Letta Median, MD   Note: This dictation was prepared with Dragon dictation along with smaller phrase technology. Any transcriptional errors that result from this process are unintentional.

## 2018-03-21 NOTE — Telephone Encounter (Signed)
TCM....  Patient is being discharged Later today   They saw Dr Louisa Second in hospital   They are scheduled to see Dr [name] on 04/07/18   They were seen for Dyspnea on Exertion   They need to be seen within 1 week but needs sooner appointment   Pt is on the wait list   Please call

## 2018-03-21 NOTE — Clinical Social Work Note (Signed)
CSW received consult that patient is homeless.  CSW met with patient to discuss his situation, patient states he was released from prison in April, and has been living with a friend, but then something happened between them, and he is living on the streets now until he gets his apartment in a week.  Patient states he is working with someone he knows to stay in an apartment, and he is looking forward to going to his apartment once everything is completed.  Patient was given a voucher to get to downtown [place] where he knows a couple of people that may be able to help him with some temporary housing.  Patient was also given a couple of bus passes and a list of resources in [place] along with some housing and homeless resources in [place], and [place].  Patient was appreciative of resources given.  CSW to sign off, please reconsult if social work needs arise.  Jones Broom. [place], MSW, [place]  03/21/2018 5:45 PM

## 2018-03-21 NOTE — Progress Notes (Signed)
IV and tele removed from patient. Discharge instructions given to patient along with hard copy prescriptions and prescription for lisinopril. Verbalized understanding. No acute distress. Patient provided taxi voucher for ride. [employer] [employer] taxi called for transportation and patient is currently awaiting.

## 2018-03-21 NOTE — Care Management (Signed)
Patient for discharge today.  Verbally confirms he has medication coverage but no money until first of the month.  Says he has [employer] insurance for his meds and not just any drug store will take the insurance.  CM provided patient with pharmacies that accept his plan.  He does not have transportation to get to the drug store and has no place to go at discharge.  CSW is aware and assessing.  Patient can not go to the shelter because he is on parole for 10 years for a crime he did not commit.  Patient does not have any of his insurance cards with shim.  CM called patient's lisinopril to [facility] and they will deliver it to the nursing unit.  CM found financial resources to pay for the medication. He has follow up appointment with pcp and has been given [employer] for transportation

## 2018-03-21 NOTE — Progress Notes (Signed)
    Patient presented for [facility] this morning. Stress test completed without acute event. Patient tolerated well. Await final report later today.

## 2018-03-24 NOTE — Telephone Encounter (Signed)
Patient contacted regarding discharge from [facility] on Friday 03/21/18.  Patient understands to follow up with provider Dr. [name] on 04/07/18 at 2:20 pm at the [facility] office. Patient understands discharge instructions? Yes Patient understands medications and regiment? Yes, but he does not currently have any of his meds except for lisinopril Patient understands to bring all medications to this visit? Yes  Per the patient, he has RX's for his medications but has not gotten them filled due to transportation & finances.  He was given lisinopril at discharge.  He states that he has [employer], but his insurance is making him pay a co-pay that he cannot afford at the local pharmacy or he has to wait for mail-order. I reviewed with the patient that [facility] offers furosemide, lisinopril, & simvastatin on the $4 plan. He states he does not have the money to pay for his RX's. I have offered Medication Management to him. I offered to have him come pick up the paper work and he states he does not have a ride. I offered to give him the # to call to initiate speaking with Medication Management and he asked for me to text it to him. I advised I could not do that. He would not write it down and "avoids using the internet," therefore no MyChart.  I advised him I would mail all the Medication Management paperwork to him with Dr. Donivan Scull card and a reminder of his appointment.  He has been advised multiple times that he needs to get his medications and I stated to him that some of this will require effort on his part.  He stated "no matter how much effort I put in I still don't have the money."  He is aware to please contact medication Management to see if they can help and avoid readmission to the hospital. He advised that any money he got would be going to an apartment as he has recently been incarcerated.   Application for Medication Management mailed to the patient.  Addressed confirmed.

## 2018-03-28 ENCOUNTER — Encounter: Payer: Self-pay | Admitting: Emergency Medicine

## 2018-03-28 DIAGNOSIS — B182 Chronic viral hepatitis C: Secondary | ICD-10-CM | POA: Diagnosis present

## 2018-03-28 DIAGNOSIS — E785 Hyperlipidemia, unspecified: Secondary | ICD-10-CM | POA: Diagnosis present

## 2018-03-28 DIAGNOSIS — Z9111 Patient's noncompliance with dietary regimen: Secondary | ICD-10-CM

## 2018-03-28 DIAGNOSIS — Z8249 Family history of ischemic heart disease and other diseases of the circulatory system: Secondary | ICD-10-CM | POA: Diagnosis not present

## 2018-03-28 DIAGNOSIS — E1165 Type 2 diabetes mellitus with hyperglycemia: Secondary | ICD-10-CM | POA: Diagnosis not present

## 2018-03-28 DIAGNOSIS — N179 Acute kidney failure, unspecified: Secondary | ICD-10-CM | POA: Diagnosis present

## 2018-03-28 DIAGNOSIS — Z590 Homelessness: Secondary | ICD-10-CM | POA: Diagnosis not present

## 2018-03-28 DIAGNOSIS — J449 Chronic obstructive pulmonary disease, unspecified: Secondary | ICD-10-CM | POA: Diagnosis not present

## 2018-03-28 DIAGNOSIS — I110 Hypertensive heart disease with heart failure: Principal | ICD-10-CM | POA: Diagnosis present

## 2018-03-28 DIAGNOSIS — E8809 Other disorders of plasma-protein metabolism, not elsewhere classified: Secondary | ICD-10-CM | POA: Diagnosis present

## 2018-03-28 DIAGNOSIS — G4733 Obstructive sleep apnea (adult) (pediatric): Secondary | ICD-10-CM | POA: Diagnosis not present

## 2018-03-28 DIAGNOSIS — D61818 Other pancytopenia: Secondary | ICD-10-CM | POA: Diagnosis not present

## 2018-03-28 DIAGNOSIS — I272 Pulmonary hypertension, unspecified: Secondary | ICD-10-CM | POA: Diagnosis not present

## 2018-03-28 DIAGNOSIS — Z6841 Body Mass Index (BMI) 40.0 and over, adult: Secondary | ICD-10-CM

## 2018-03-28 DIAGNOSIS — I5033 Acute on chronic diastolic (congestive) heart failure: Secondary | ICD-10-CM | POA: Diagnosis present

## 2018-03-28 DIAGNOSIS — Z9119 Patient's noncompliance with other medical treatment and regimen: Secondary | ICD-10-CM | POA: Diagnosis not present

## 2018-03-28 DIAGNOSIS — K219 Gastro-esophageal reflux disease without esophagitis: Secondary | ICD-10-CM | POA: Diagnosis not present

## 2018-03-28 DIAGNOSIS — R809 Proteinuria, unspecified: Secondary | ICD-10-CM | POA: Diagnosis present

## 2018-03-28 LAB — CBC WITH DIFFERENTIAL/PLATELET
Basophils Absolute: 0.1 10*3/uL (ref 0–0.1)
Basophils Relative: 2 %
EOS ABS: 0.1 10*3/uL (ref 0–0.7)
Eosinophils Relative: 2 %
HCT: 32.7 % — ABNORMAL LOW (ref 40.0–52.0)
HEMOGLOBIN: 11.1 g/dL — AB (ref 13.0–18.0)
Lymphocytes Relative: 24 %
Lymphs Abs: 0.9 10*3/uL — ABNORMAL LOW (ref 1.0–3.6)
MCH: 28.9 pg (ref 26.0–34.0)
MCHC: 33.9 g/dL (ref 32.0–36.0)
MCV: 85.1 fL (ref 80.0–100.0)
MONOS PCT: 13 %
Monocytes Absolute: 0.5 10*3/uL (ref 0.2–1.0)
NEUTROS PCT: 59 %
Neutro Abs: 2.1 10*3/uL (ref 1.4–6.5)
PLATELETS: 132 10*3/uL — AB (ref 150–440)
RBC: 3.84 MIL/uL — ABNORMAL LOW (ref 4.40–5.90)
RDW: 17.6 % — AB (ref 11.5–14.5)
WBC: 3.6 10*3/uL — AB (ref 3.8–10.6)

## 2018-03-28 LAB — COMPREHENSIVE METABOLIC PANEL
ALK PHOS: 74 U/L (ref 38–126)
ALT: 21 U/L (ref 0–44)
ANION GAP: 6 (ref 5–15)
AST: 43 U/L — ABNORMAL HIGH (ref 15–41)
Albumin: 3.2 g/dL — ABNORMAL LOW (ref 3.5–5.0)
BUN: 15 mg/dL (ref 8–23)
CALCIUM: 8.9 mg/dL (ref 8.9–10.3)
CO2: 25 mmol/L (ref 22–32)
Chloride: 111 mmol/L (ref 98–111)
Creatinine, Ser: 1.13 mg/dL (ref 0.61–1.24)
GFR calc non Af Amer: >60 mL/min (ref >60)
Glucose, Bld: 185 mg/dL — ABNORMAL HIGH (ref 70–99)
POTASSIUM: 3.8 mmol/L (ref 3.5–5.1)
SODIUM: 142 mmol/L (ref 135–145)
Total Bilirubin: 1.2 mg/dL (ref 0.3–1.2)
Total Protein: 6.9 g/dL (ref 6.5–8.1)

## 2018-03-28 NOTE — ED Triage Notes (Signed)
Patient was brought in by ems. Patient with complaint of bilateral leg cramping times 3 days to a week. Patient states that his legs started cramping a few days after starting lasix. Patient states that he has not been taking lasix since he was discharged because he is homeless and unable to fill his prescriptions.

## 2018-03-29 ENCOUNTER — Inpatient Hospital Stay: Payer: Medicare Other

## 2018-03-29 ENCOUNTER — Encounter: Payer: Self-pay | Admitting: Internal Medicine

## 2018-03-29 ENCOUNTER — Other Ambulatory Visit: Payer: Self-pay

## 2018-03-29 ENCOUNTER — Emergency Department: Payer: Medicare Other

## 2018-03-29 ENCOUNTER — Inpatient Hospital Stay
Admission: EM | Admit: 2018-03-29 | Discharge: 2018-03-31 | DRG: 292 | Disposition: A | Discharge status: Home / Self Care | Payer: Medicare Other | Attending: Internal Medicine | Admitting: Internal Medicine

## 2018-03-29 DIAGNOSIS — G4733 Obstructive sleep apnea (adult) (pediatric): Secondary | ICD-10-CM | POA: Diagnosis not present

## 2018-03-29 DIAGNOSIS — I272 Pulmonary hypertension, unspecified: Secondary | ICD-10-CM | POA: Diagnosis not present

## 2018-03-29 DIAGNOSIS — R0602 Shortness of breath: Secondary | ICD-10-CM | POA: Diagnosis not present

## 2018-03-29 DIAGNOSIS — R809 Proteinuria, unspecified: Secondary | ICD-10-CM | POA: Diagnosis present

## 2018-03-29 DIAGNOSIS — M79605 Pain in left leg: Secondary | ICD-10-CM | POA: Diagnosis not present

## 2018-03-29 DIAGNOSIS — N179 Acute kidney failure, unspecified: Secondary | ICD-10-CM | POA: Diagnosis not present

## 2018-03-29 DIAGNOSIS — E1165 Type 2 diabetes mellitus with hyperglycemia: Secondary | ICD-10-CM | POA: Diagnosis not present

## 2018-03-29 DIAGNOSIS — Z9119 Patient's noncompliance with other medical treatment and regimen: Secondary | ICD-10-CM | POA: Diagnosis not present

## 2018-03-29 DIAGNOSIS — E785 Hyperlipidemia, unspecified: Secondary | ICD-10-CM | POA: Diagnosis not present

## 2018-03-29 DIAGNOSIS — I110 Hypertensive heart disease with heart failure: Secondary | ICD-10-CM | POA: Diagnosis not present

## 2018-03-29 DIAGNOSIS — I5032 Chronic diastolic (congestive) heart failure: Secondary | ICD-10-CM

## 2018-03-29 DIAGNOSIS — B182 Chronic viral hepatitis C: Secondary | ICD-10-CM | POA: Diagnosis not present

## 2018-03-29 DIAGNOSIS — I5033 Acute on chronic diastolic (congestive) heart failure: Secondary | ICD-10-CM | POA: Diagnosis not present

## 2018-03-29 DIAGNOSIS — Z590 Homelessness: Secondary | ICD-10-CM | POA: Diagnosis not present

## 2018-03-29 DIAGNOSIS — M79604 Pain in right leg: Secondary | ICD-10-CM | POA: Diagnosis not present

## 2018-03-29 DIAGNOSIS — K219 Gastro-esophageal reflux disease without esophagitis: Secondary | ICD-10-CM | POA: Diagnosis not present

## 2018-03-29 DIAGNOSIS — Z6841 Body Mass Index (BMI) 40.0 and over, adult: Secondary | ICD-10-CM | POA: Diagnosis not present

## 2018-03-29 DIAGNOSIS — Z9111 Patient's noncompliance with dietary regimen: Secondary | ICD-10-CM | POA: Diagnosis not present

## 2018-03-29 DIAGNOSIS — D61818 Other pancytopenia: Secondary | ICD-10-CM | POA: Diagnosis not present

## 2018-03-29 DIAGNOSIS — J810 Acute pulmonary edema: Secondary | ICD-10-CM

## 2018-03-29 DIAGNOSIS — E8809 Other disorders of plasma-protein metabolism, not elsewhere classified: Secondary | ICD-10-CM | POA: Diagnosis not present

## 2018-03-29 DIAGNOSIS — Z8249 Family history of ischemic heart disease and other diseases of the circulatory system: Secondary | ICD-10-CM | POA: Diagnosis not present

## 2018-03-29 DIAGNOSIS — J449 Chronic obstructive pulmonary disease, unspecified: Secondary | ICD-10-CM | POA: Diagnosis not present

## 2018-03-29 DIAGNOSIS — R600 Localized edema: Secondary | ICD-10-CM

## 2018-03-29 HISTORY — DX: Unspecified asthma, uncomplicated: J45.909

## 2018-03-29 LAB — URINALYSIS, COMPLETE (UACMP) WITH MICROSCOPIC
BACTERIA UA: NONE SEEN
Bilirubin Urine: NEGATIVE
Glucose, UA: NEGATIVE mg/dL
Hgb urine dipstick: NEGATIVE
KETONES UR: NEGATIVE mg/dL
LEUKOCYTES UA: NEGATIVE
NITRITE: NEGATIVE
PROTEIN: NEGATIVE mg/dL
Specific Gravity, Urine: 1.008 (ref 1.005–1.030)
pH: 6 (ref 5.0–8.0)

## 2018-03-29 LAB — BRAIN NATRIURETIC PEPTIDE: B Natriuretic Peptide: 132 pg/mL — ABNORMAL HIGH (ref 0.0–100.0)

## 2018-03-29 LAB — MAGNESIUM: MAGNESIUM: 2 mg/dL (ref 1.7–2.4)

## 2018-03-29 LAB — TROPONIN I
Troponin I: <0.03 ng/mL (ref <0.03)
Troponin I: <0.03 ng/mL (ref <0.03)
Troponin I: <0.03 ng/mL (ref <0.03)

## 2018-03-29 LAB — GLUCOSE, CAPILLARY
GLUCOSE-CAPILLARY: 73 mg/dL (ref 70–99)
GLUCOSE-CAPILLARY: 84 mg/dL (ref 70–99)
Glucose-Capillary: 117 mg/dL — ABNORMAL HIGH (ref 70–99)
Glucose-Capillary: 149 mg/dL — ABNORMAL HIGH (ref 70–99)

## 2018-03-29 LAB — PROTEIN, URINE, RANDOM: Total Protein, Urine: <6 mg/dL

## 2018-03-29 LAB — CK: CK TOTAL: 112 U/L (ref 49–397)

## 2018-03-29 LAB — CREATININE, URINE, RANDOM: Creatinine, Urine: 50 mg/dL

## 2018-03-29 LAB — PHOSPHORUS: PHOSPHORUS: 2.5 mg/dL (ref 2.5–4.6)

## 2018-03-29 MED ORDER — ONDANSETRON HCL 4 MG/2ML IJ SOLN
4.0000 mg | Freq: Once | INTRAMUSCULAR | Status: AC
  Administered 2018-03-29: 4 mg via INTRAVENOUS
  Filled 2018-03-29: qty 2

## 2018-03-29 MED ORDER — FUROSEMIDE 10 MG/ML IJ SOLN
20.0000 mg | Freq: Once | INTRAMUSCULAR | Status: AC
  Administered 2018-03-29: 20 mg via INTRAVENOUS
  Filled 2018-03-29: qty 4

## 2018-03-29 MED ORDER — ACETAMINOPHEN 325 MG PO TABS: 650.0000 mg | ORAL_TABLET | ORAL | Status: DC | PRN

## 2018-03-29 MED ORDER — SODIUM CHLORIDE 0.9 % IV SOLN: 250.0000 mL | INTRAVENOUS | Status: DC | PRN

## 2018-03-29 MED ORDER — FUROSEMIDE 40 MG PO TABS
40.0000 mg | ORAL_TABLET | Freq: Every day | ORAL | Status: DC
  Administered 2018-03-29: 40 mg via ORAL
  Filled 2018-03-29: qty 1

## 2018-03-29 MED ORDER — ONDANSETRON HCL 4 MG/2ML IJ SOLN
4.0000 mg | Freq: Four times a day (QID) | INTRAMUSCULAR | Status: DC | PRN
  Filled 2018-03-29: qty 2

## 2018-03-29 MED ORDER — ASPIRIN 81 MG PO CHEW
81.0000 mg | CHEWABLE_TABLET | Freq: Every day | ORAL | Status: DC
  Administered 2018-03-29 – 2018-03-31 (×3): 81 mg via ORAL
  Filled 2018-03-29 (×3): qty 1

## 2018-03-29 MED ORDER — INSULIN ASPART 100 UNIT/ML ~~LOC~~ SOLN: 0.0000 [IU] | Freq: Every day | SUBCUTANEOUS | Status: DC

## 2018-03-29 MED ORDER — ASPIRIN 81 MG PO CHEW
162.0000 mg | CHEWABLE_TABLET | Freq: Once | ORAL | Status: DC
  Filled 2018-03-29: qty 2

## 2018-03-29 MED ORDER — ACETAMINOPHEN-CODEINE #3 300-30 MG PO TABS: 1.0000 | ORAL_TABLET | ORAL | Status: DC | PRN

## 2018-03-29 MED ORDER — SODIUM CHLORIDE 0.9% FLUSH: 3.0000 mL | INTRAVENOUS | Status: DC | PRN

## 2018-03-29 MED ORDER — POTASSIUM CHLORIDE CRYS ER 20 MEQ PO TBCR
40.0000 meq | EXTENDED_RELEASE_TABLET | Freq: Once | ORAL | Status: AC
  Administered 2018-03-29: 40 meq via ORAL
  Filled 2018-03-29: qty 2

## 2018-03-29 MED ORDER — ASPIRIN 81 MG PO CHEW: 324.0000 mg | CHEWABLE_TABLET | Freq: Once | ORAL | Status: DC

## 2018-03-29 MED ORDER — POTASSIUM CHLORIDE CRYS ER 20 MEQ PO TBCR
20.0000 meq | EXTENDED_RELEASE_TABLET | Freq: Every day | ORAL | Status: DC
  Administered 2018-03-29 – 2018-03-31 (×3): 20 meq via ORAL
  Filled 2018-03-29 (×3): qty 1

## 2018-03-29 MED ORDER — PANTOPRAZOLE SODIUM 40 MG PO TBEC
40.0000 mg | DELAYED_RELEASE_TABLET | Freq: Every day | ORAL | Status: DC
Start: 2018-03-29 — End: 2018-03-31
  Administered 2018-03-29 – 2018-03-31 (×3): 40 mg via ORAL
  Filled 2018-03-29 (×3): qty 1

## 2018-03-29 MED ORDER — FUROSEMIDE 10 MG/ML IJ SOLN
20.0000 mg | Freq: Two times a day (BID) | INTRAMUSCULAR | Status: DC
  Administered 2018-03-29 – 2018-03-31 (×4): 20 mg via INTRAVENOUS
  Filled 2018-03-29 (×4): qty 2

## 2018-03-29 MED ORDER — ALBUTEROL SULFATE (2.5 MG/3ML) 0.083% IN NEBU: 2.5000 mg | INHALATION_SOLUTION | Freq: Four times a day (QID) | RESPIRATORY_TRACT | Status: DC | PRN

## 2018-03-29 MED ORDER — SIMVASTATIN 20 MG PO TABS
40.0000 mg | ORAL_TABLET | Freq: Every day | ORAL | Status: DC
  Administered 2018-03-29 – 2018-03-30 (×2): 40 mg via ORAL
  Filled 2018-03-29 (×2): qty 2

## 2018-03-29 MED ORDER — POTASSIUM CHLORIDE CRYS ER 20 MEQ PO TBCR: 40.0000 meq | EXTENDED_RELEASE_TABLET | Freq: Every day | ORAL | Status: DC

## 2018-03-29 MED ORDER — SODIUM CHLORIDE 0.9% FLUSH
3.0000 mL | Freq: Two times a day (BID) | INTRAVENOUS | Status: DC
  Administered 2018-03-29 – 2018-03-30 (×4): 3 mL via INTRAVENOUS

## 2018-03-29 MED ORDER — ALUM & MAG HYDROXIDE-SIMETH 200-200-20 MG/5ML PO SUSP: 30.0000 mL | Freq: Four times a day (QID) | ORAL | Status: DC | PRN

## 2018-03-29 MED ORDER — INSULIN ASPART 100 UNIT/ML ~~LOC~~ SOLN
0.0000 [IU] | Freq: Three times a day (TID) | SUBCUTANEOUS | Status: DC
  Administered 2018-03-29: 2 [IU] via SUBCUTANEOUS

## 2018-03-29 MED ORDER — HYDROCODONE-ACETAMINOPHEN 5-325 MG PO TABS
1.0000 | ORAL_TABLET | Freq: Four times a day (QID) | ORAL | Status: DC | PRN
  Administered 2018-03-29 (×2): 2 via ORAL
  Administered 2018-03-29: 1 via ORAL
  Administered 2018-03-30 – 2018-03-31 (×3): 2 via ORAL
  Filled 2018-03-29 (×5): qty 2
  Filled 2018-03-29: qty 1

## 2018-03-29 MED ORDER — ALUMINUM & MAGNESIUM HYDROXIDE 200-200 MG/5ML PO SUSP: 30.0000 mL | Freq: Four times a day (QID) | ORAL | Status: DC | PRN

## 2018-03-29 MED ORDER — ENOXAPARIN SODIUM 40 MG/0.4ML ~~LOC~~ SOLN
40.0000 mg | SUBCUTANEOUS | Status: DC
  Administered 2018-03-29 – 2018-03-30 (×2): 40 mg via SUBCUTANEOUS
  Filled 2018-03-29 (×2): qty 0.4

## 2018-03-29 MED ORDER — MAGNESIUM GLUCONATE 500 MG PO TABS
500.0000 mg | ORAL_TABLET | Freq: Once | ORAL | Status: AC
  Administered 2018-03-29: 500 mg via ORAL
  Filled 2018-03-29 (×2): qty 1

## 2018-03-29 MED ORDER — LISINOPRIL 10 MG PO TABS
10.0000 mg | ORAL_TABLET | Freq: Every day | ORAL | Status: DC
  Administered 2018-03-29 – 2018-03-31 (×3): 10 mg via ORAL
  Filled 2018-03-29 (×3): qty 1

## 2018-03-29 NOTE — ED Provider Notes (Signed)
[facility] Emergency Department Provider Note  ____________________________________________   First MD Initiated Contact with Patient 03/29/18 0041     (approximate)  I have reviewed the triage vital signs and the nursing notes.   HISTORY  Chief Complaint Leg Pain   HPI [name] is a 67 y.o. male comes to the emergency department via EMS with bilateral leg cramping for the past 3 or 4 days.  Associated with some leg swelling.  He was recently admitted to the hospital for acute pulmonary edema and CHF exacerbation however today he became homeless and he has been unable to afford his medications.  He is not taking his Lasix and he feels like his symptoms have been worsening ever since he has been out of his medications.  His symptoms have been insidious onset slowly progressive are now moderate severity.  They are worse when lying flat improved when sitting up.  He denies chest pain.    Past Medical History:  Diagnosis Date  . Asthma   . COPD (chronic obstructive pulmonary disease) ([place])   . Diabetes mellitus without complication ([place])   . Homelessness   . Hypertension   . Migraine   . Obesity   . Sleep apnea     Patient Active Problem List   Diagnosis Date Noted  . Bilateral leg pain 03/29/2018  . Chest pain 03/20/2018  . Adjustment disorder with mixed anxiety and depressed mood 12/16/2017  . Homelessness 12/16/2017    Past Surgical History:  Procedure Laterality Date  . CARDIAC CATHETERIZATION    . CHOLECYSTECTOMY    . EYE SURGERY    . INNER EAR SURGERY    . NOSE SURGERY      Prior to Admission medications   Medication Sig Start Date End Date Taking? Authorizing Provider  acetaminophen (TYLENOL) 650 MG CR tablet Take 1 tablet (650 mg total) by mouth 2 (two) times daily as needed for pain. 03/31/18   Gouru, Illene Silver, MD  albuterol (PROVENTIL HFA;VENTOLIN HFA) 108 (90 Base) MCG/ACT inhaler Inhale 1-2 puffs into the lungs every 6 (six)  hours as needed for wheezing or shortness of breath. 03/31/18   Nicholes Mango, MD  aluminum-magnesium hydroxide 200-200 MG/5ML suspension Take 30 mLs by mouth every 6 (six) hours as needed for indigestion.    [provider]  aspirin 81 MG chewable tablet Chew 1 tablet (81 mg total) by mouth daily. Patient not taking: Reported on 03/29/2018 03/21/18   Demetrios Loll, MD  furosemide (LASIX) 20 MG tablet Take 1 tablet (20 mg total) by mouth 2 (two) times daily. 03/31/18   Nicholes Mango, MD  HYDROcodone-acetaminophen (NORCO/VICODIN) 5-325 MG tablet Take 1-2 tablets by mouth 3 (three) times daily as needed for up to 10 doses for moderate pain or severe pain. 03/31/18   Gouru, Illene Silver, MD  lisinopril (PRINIVIL,ZESTRIL) 10 MG tablet Take 1 tablet (10 mg total) by mouth daily. 03/31/18   Gouru, Illene Silver, MD  metFORMIN (GLUCOPHAGE-XR) 500 MG 24 hr tablet Take 2 tablets (1,000 mg total) by mouth 2 (two) times daily. 03/31/18   Nicholes Mango, MD  omeprazole (PRILOSEC) 40 MG capsule Take 1 capsule (40 mg total) by mouth AC breakfast. 03/31/18   Gouru, Illene Silver, MD  potassium chloride (K-DUR,KLOR-CON) 10 MEQ tablet Take 1 tablet (10 mEq total) by mouth daily. 03/31/18   Nicholes Mango, MD  simvastatin (ZOCOR) 40 MG tablet Take 1 tablet (40 mg total) by mouth daily at 6 PM. 03/31/18   Nicholes Mango, MD  Allergies Eggs or egg-derived products; Novocain [procaine]; and Sulfa antibiotics  Family History  Problem Relation Age of Onset  . Hypertension Mother   . CAD Mother   . CAD Father     Social History Social History   Tobacco Use  . Smoking status: Never Smoker  . Smokeless tobacco: Never Used  Substance Use Topics  . Alcohol use: Not Currently  . Drug use: Not Currently    Review of Systems Constitutional: No fever/chills Eyes: No visual changes. ENT: No sore throat. Cardiovascular: Denies chest pain. Respiratory: Positive for shortness of breath. Gastrointestinal: No abdominal pain.  No nausea, no vomiting.  No  diarrhea.  No constipation. Genitourinary: Negative for dysuria. Musculoskeletal: As of her leg swelling Skin: Negative for rash. Neurological: Negative for headaches, focal weakness or numbness.   ____________________________________________   PHYSICAL EXAM:  VITAL SIGNS: ED Triage Vitals  Enc Vitals Group     BP 03/28/18 2056 (!) 144/61     Pulse Rate 03/28/18 2056 60     Resp 03/28/18 2056 18     Temp 03/28/18 2056 98.3 F (36.8 C)     Temp Source 03/28/18 2056 Oral     SpO2 03/28/18 2056 98 %     Weight 03/28/18 2058 248 lb (112.5 kg)     Height 03/28/18 2058 5\' 6"  (1.676 m)     Head Circumference --      Peak Flow --      Pain Score 03/28/18 2058 10     Pain Loc --      Pain Edu? --      Excl. in [place]? --     Constitutional: Alert and oriented x4 appears somewhat uncomfortable Eyes: PERRL EOMI. Head: Atraumatic. Nose: No congestion/rhinnorhea. Mouth/Throat: No trismus Neck: No stridor.  Unable to lie quite flat with large JVD Cardiovascular: Normal rate, regular rhythm. Grossly normal heart sounds.  Good peripheral circulation. Respiratory: Normal respiratory effort.  No retractions. Lungs CTAB and moving good air Gastrointestinal: Obese soft nontender Musculoskeletal: 3+ pitting edema to mid thigh bilaterally legs are weeping Neurologic:  Normal speech and language. No gross focal neurologic deficits are appreciated. Skin:  Skin is warm, dry and intact. No rash noted. Psychiatric: Mood and affect are normal. Speech and behavior are normal.    ____________________________________________   DIFFERENTIAL includes but not limited to  Acute pulmonary edema, CHF exacerbation, DVT, pulmonary embolism ____________________________________________   LABS (all labs ordered are listed, but only abnormal results are displayed)  Labs Reviewed  CBC WITH DIFFERENTIAL/PLATELET - Abnormal; Notable for the following components:      Result Value   WBC 3.6 (*)    RBC  3.84 (*)    Hemoglobin 11.1 (*)    HCT 32.7 (*)    RDW 17.6 (*)    Platelets 132 (*)    Lymphs Abs 0.9 (*)    All other components within normal limits  COMPREHENSIVE METABOLIC PANEL - Abnormal; Notable for the following components:   Glucose, Bld 185 (*)    Albumin 3.2 (*)    AST 43 (*)    All other components within normal limits  BRAIN NATRIURETIC PEPTIDE - Abnormal; Notable for the following components:   B Natriuretic Peptide 132.0 (*)    All other components within normal limits  URINALYSIS, COMPLETE (UACMP) WITH MICROSCOPIC - Abnormal; Notable for the following components:   Color, Urine YELLOW (*)    APPearance CLEAR (*)    All other components within normal limits  GLUCOSE, CAPILLARY - Abnormal; Notable for the following components:   Glucose-Capillary 117 (*)    All other components within normal limits  GLUCOSE, CAPILLARY - Abnormal; Notable for the following components:   Glucose-Capillary 149 (*)    All other components within normal limits  BASIC METABOLIC PANEL - Abnormal; Notable for the following components:   Glucose, Bld 135 (*)    Creatinine, Ser 1.30 (*)    Calcium 8.7 (*)    GFR calc non Af Amer 55 (*)    All other components within normal limits  GLUCOSE, CAPILLARY - Abnormal; Notable for the following components:   Glucose-Capillary 110 (*)    All other components within normal limits  TROPONIN I  CK  MAGNESIUM  PHOSPHORUS  PROTEIN, URINE, RANDOM  CREATININE, URINE, RANDOM  HIV ANTIBODY (ROUTINE TESTING)  HCV RNA QUANT RFLX ULTRA OR GENOTYP  TROPONIN I  TROPONIN I  GLUCOSE, CAPILLARY  GLUCOSE, CAPILLARY  GLUCOSE, CAPILLARY  GLUCOSE, CAPILLARY  BASIC METABOLIC PANEL  GLUCOSE, CAPILLARY  GLUCOSE, CAPILLARY  HEPATITIS C GENOTYPE    Lab work reviewed by me consistent with fluid overload __________________________________________  EKG    ____________________________________________  RADIOLOGY  Chest x-ray reviewed by me consistent  with acute pulmonary edema ____________________________________________   PROCEDURES  Procedure(s) performed: no  Procedures  Critical Care performed: no  ____________________________________________   INITIAL IMPRESSION / ASSESSMENT AND PLAN / ED COURSE  Pertinent labs & imaging results that were available during my care of the patient were reviewed by me and considered in my medical decision making (see chart for details).   As part of my medical decision making, I reviewed the following data within the [place] History obtained from family if available, nursing notes, old chart and ekg, as well as notes from prior ED visits.  The patient arrives symptomatic with worsening pulmonary edema and peripheral edema in the setting of heart failure.  He has been noncompliant with his medications secondary to cost.  At this point given the severity of his symptoms he requires inpatient admission for IV diuresis.  I have initiated IV Lasix here and have begun adequately diuresing him.  I discussed with the hospitalist who is graciously agreed to admit the patient to his service.      ____________________________________________   FINAL CLINICAL IMPRESSION(S) / ED DIAGNOSES  Final diagnoses:  Acute pulmonary edema ([place])      NEW MEDICATIONS STARTED DURING THIS VISIT:  Discharge Medication List as of 03/31/2018 12:35 PM    START taking these medications   Details  HYDROcodone-acetaminophen (NORCO/VICODIN) 5-325 MG tablet Take 1-2 tablets by mouth 3 (three) times daily as needed for up to 10 doses for moderate pain or severe pain., Starting Mon 03/31/2018, Print         Note:  This document was prepared using Dragon voice recognition software and may include unintentional dictation errors.     Darel Hong, MD 04/04/18 2221

## 2018-03-29 NOTE — Progress Notes (Signed)
Chaplain responded to an AD for an OR. Pt is alert and talkative. Pt talked about his history of family and life. Chaplain educated Pt on [place] and [place].  Pt discussed not wanting to be a financial   drain to his 2 daughters. He discussed wanting to to donate his organs to [facility] system. Chaplain maintained a pastoral presence and  active listening. Pt "is not concerned about death but the life in between".  Chaplain offered prayer and Pt accepted. Cplain prayed for his healing and care. Pt will have nurse page if AD is needed    03/29/18 0900  Clinical Encounter Type  Visited With Patient  Visit Type Initial  Referral From Nurse  Spiritual Encounters  Spiritual Needs Brochure;Prayer

## 2018-03-29 NOTE — Progress Notes (Signed)
[place] [facility] [place] NAME: [name]    MR#:  [id number]  DATE OF BIRTH:  16-Aug-1951  SUBJECTIVE:   Patient complains of generalized aches and pains everywhere, denies any shortness of breath, chest pain.  Patient was admitted to the hospital due to lower extremity edema and possible right-sided congestive heart failure.  Patient was discharged recently but is homeless and could not afford his medications.  REVIEW OF SYSTEMS:    Review of Systems  Constitutional: Negative for chills and fever.  HENT: Negative for congestion and tinnitus.   Eyes: Negative for blurred vision and double vision.  Respiratory: Positive for shortness of breath. Negative for cough and wheezing.   Cardiovascular: Positive for leg swelling. Negative for chest pain, orthopnea and PND.  Gastrointestinal: Negative for abdominal pain, diarrhea, nausea and vomiting.  Genitourinary: Negative for dysuria and hematuria.  Neurological: Positive for weakness (Generalized). Negative for dizziness, sensory change and focal weakness.  All other systems reviewed and are negative.   Nutrition: Heart healthy/Carb control Tolerating Diet: Yes Tolerating PT: Await Eval.   DRUG ALLERGIES:   Allergies  Allergen Reactions  . Eggs Or Egg-Derived Products Nausea And Vomiting  . Novocain [Procaine]   . Sulfa Antibiotics     VITALS:  Blood pressure (!) 148/65, pulse (!) 55, temperature 98.4 F (36.9 C), temperature source Oral, resp. rate 18, height 5\' 6"  (1.676 m), weight 113.8 kg (250 lb 12.8 oz), SpO2 98 %.  PHYSICAL EXAMINATION:   Physical Exam  GENERAL:  67 y.o.-year-old patient lying in bed in no acute distress.  EYES: Pupils equal, round, reactive to light and accommodation. No scleral icterus. Extraocular muscles intact.  HEENT: Head atraumatic, normocephalic. Oropharynx and nasopharynx clear.  NECK:  Supple, no jugular venous distention. No thyroid enlargement, no  tenderness.  LUNGS: Normal breath sounds bilaterally, no wheezing, rales, rhonchi. No use of accessory muscles of respiration.  CARDIOVASCULAR: S1, S2 normal. No murmurs, rubs, or gallops.  ABDOMEN: Soft, nontender, nondistended. Bowel sounds present. No organomegaly or mass.  EXTREMITIES: No cyanosis, clubbing, + 2 edema b/l NEUROLOGIC: Cranial nerves II through XII are intact. No focal Motor or sensory deficits b/l. Globally weak.   PSYCHIATRIC: The patient is alert and oriented x 3.  SKIN: No obvious rash, lesion, or ulcer.    LABORATORY PANEL:   CBC Recent Labs  Lab 03/28/18 2108  WBC 3.6*  HGB 11.1*  HCT 32.7*  PLT 132*   ------------------------------------------------------------------------------------------------------------------  Chemistries  Recent Labs  Lab 03/28/18 2108  NA 142  K 3.8  CL 111  CO2 25  GLUCOSE 185*  BUN 15  CREATININE 1.13  CALCIUM 8.9  MG 2.0  AST 43*  ALT 21  ALKPHOS 74  BILITOT 1.2   ------------------------------------------------------------------------------------------------------------------  Cardiac Enzymes Recent Labs  Lab 03/29/18 0550  TROPONINI <0.03   ------------------------------------------------------------------------------------------------------------------  RADIOLOGY:  US Renal  Result Date: 03/29/2018 CLINICAL DATA:  Proteinuria EXAM: RENAL / URINARY TRACT ULTRASOUND COMPLETE COMPARISON:  None. FINDINGS: Right Kidney: Length: 12 cm. Echogenicity within normal limits. No mass or hydronephrosis visualized. 7 mm calcification/stone within the lower pole of the RIGHT kidney. Left Kidney: Length: 10 cm. Echogenicity within normal limits. Lobular configuration of the cortex but no discrete mass identified. No hydronephrosis visualized. Bladder: Appears normal for degree of bladder distention. IMPRESSION: 1. No acute findings.  No hydronephrosis. 2. 7 mm calcification/stone within the lower pole of the RIGHT kidney. 3.  Lobular configuration of the LEFT  renal cortex, but without discrete mass. Consider confirmation with nonemergent renal protocol CT or MRI if/when renal function allows. Electronically Signed   By: Franki Cabot M.D.   On: 03/29/2018 08:30   Dg Chest Port 1 View  Result Date: 03/29/2018 CLINICAL DATA:  Shortness of breath.  Untreated heart failure. EXAM: PORTABLE CHEST 1 VIEW COMPARISON:  Radiograph 03/19/2018 FINDINGS: Unchanged cardiomegaly with aortic tortuosity. Mild vascular congestion without overt pulmonary edema. Minimal left lung base scarring. No focal airspace disease, pleural fluid or pneumothorax. Stable osseous structures. IMPRESSION: Mild vascular congestion.  Unchanged cardiomegaly. Electronically Signed   By: Jeb Levering M.D.   On: 03/29/2018 02:18     ASSESSMENT AND PLAN:   67 year old male with past medical history of COPD, diabetes, hypertension, chronic diastolic CHF obstructive sleep apnea who presents to the hospital due to generalized aches and pains and also shortness of breath and noted to be in mild CHF.  1.  CHF-acute on chronic diastolic dysfunction. -Patient says he could not afford his medications when she got discharged from the hospital even though social work was able to get him help with that. -We will start him on some IV Lasix, follow I's and O's and daily weights. -Continue lisinopril, await cardiology input.  2.  Diabetes type 2 without complication-continue sliding scale insulin.  Follow blood sugars.  3.  Hyperlipidemia-continue simvastatin.  4. GERD - cont. Protonix.   Dispo -patient is homeless and could not afford his medications.  This notified care manager who will look into this further.  Patient was arranged for med management and given his prescriptions but apparently says could not get them.  Will need to see if disposition upon discharge this time.    All the records are reviewed and case discussed with Care Management/Social  Worker. Management plans discussed with the patient, family and they are in agreement.  CODE STATUS: Full code  DVT Prophylaxis: Lovenox  TOTAL TIME TAKING CARE OF THIS PATIENT: 30 minutes.   POSSIBLE D/C IN 1-2 DAYS, DEPENDING ON CLINICAL CONDITION.   Henreitta Leber M.D on 03/29/2018 at 12:43 PM  Between 7am to 6pm - Pager - [phone]  After 6pm go to [email or web address] - Proofreader  [facility]  Office  [phone]  CC: Primary care physician; Letta Median, MD

## 2018-03-29 NOTE — Care Management (Signed)
RNCM received consult for complex discharge planning. Per patient he is currently homeless and has been staying on the street. Due to an extensive criminal history he is not allowed in any local homeless shelters and has had many barriers attempting to find any place to live. He states that he has looked into boarding homes as well as subsidized housing but that they "cost too much and want to take my money". Patient adamantly refuses to talk about the potential of skilled nursing care homes because "they killed his mother". Patient confirms he does have around $900 a month come in via social security. He has been set up with medication management but never followed through and previous case management/social work documentation supports that he has been given numerous resources for both medications, transportation and housing. Patient has insurance coverage and a PCP. Patient admits to non compliance with medications and does not often take them or get them filled. He has no scales to monitor his weight and goes by "how much fluid he has on him" top detrmine if he should come to the hospital. CSW is also assessing patient and we will collaborate for discharge planning. Ines Bloomer RN BSN RNCM [phone]

## 2018-03-29 NOTE — Consult Note (Addendum)
Cardiology Consultation:   Patient ID: [name]; [id number]; 07/14/1951   Admit date: 03/29/2018 Date of Consult: 03/29/2018  Primary Care Provider: Letta Median, MD Primary Cardiologist  Dr [name]   Patient Profile:   [name] is a 67 y.o. male with a hx of reported CHF, COPD, DM2, HTN, migraine disorder, obesity, OSA not compliant with CPAP, homelessness, and recent incarceration who is admitted with ongoing edema, leg pain, and SOB.  Cardiology consultation is requested by Dr Verdell Carmine for evaluation of CHF.  History of Present Illness:   [name] we just discharged from [facility] (notes reviewed) after hospitalization for SOB and edema.  He was evaluated by Dr [name] and diuresed.  He had myoview which was uneventful.  He was discharged and now returns to the ED primarily complaining with lower abdominal and leg pains.  Of note, he has not taken any medicines since his recent discharge.  He denies CP, palpitations, dizziness, presyncope or syncope.  He primarily complains over abdominal and leg pain/ cramping and states "where is my pain pill."  Past Medical History:  Diagnosis Date  . Asthma   . COPD (chronic obstructive pulmonary disease) ([place])   . Diabetes mellitus without complication ([place])   . Homelessness   . Hypertension   . Migraine   . Obesity   . Sleep apnea     Past Surgical History:  Procedure Laterality Date  . CARDIAC CATHETERIZATION    . CHOLECYSTECTOMY    . EYE SURGERY    . INNER EAR SURGERY    . NOSE SURGERY       Home Medications:  Prior to Admission medications   Medication Sig Start Date End Date Taking? Authorizing Provider  acetaminophen (TYLENOL) 650 MG CR tablet Take 650 mg by mouth 2 (two) times daily.    [provider]  acetaminophen-codeine (TYLENOL #3) 300-30 MG tablet Take 1 tablet by mouth every 4 (four) hours as needed for moderate pain.    [provider]  albuterol (PROVENTIL HFA;VENTOLIN HFA) 108 (90  Base) MCG/ACT inhaler Inhale 1-2 puffs into the lungs every 6 (six) hours as needed for wheezing or shortness of breath.    [provider]  aluminum-magnesium hydroxide 200-200 MG/5ML suspension Take 30 mLs by mouth every 6 (six) hours as needed for indigestion.    [provider]  aspirin 81 MG chewable tablet Chew 1 tablet (81 mg total) by mouth daily. Patient not taking: Reported on 03/29/2018 03/21/18   Demetrios Loll, MD  furosemide (LASIX) 40 MG tablet Take 1 tablet (40 mg total) by mouth daily. Patient not taking: Reported on 03/29/2018 03/21/18 03/21/19  Demetrios Loll, MD  lisinopril (PRINIVIL,ZESTRIL) 10 MG tablet Take 1 tablet (10 mg total) by mouth daily. Patient not taking: Reported on 03/29/2018 03/21/18   Demetrios Loll, MD  loperamide (IMODIUM) 2 MG capsule Take 2 mg by mouth as needed for diarrhea or loose stools.    [provider]  metFORMIN (GLUCOPHAGE-XR) 500 MG 24 hr tablet Take 2 tablets (1,000 mg total) by mouth 2 (two) times daily. Patient not taking: Reported on 03/29/2018 03/21/18   Demetrios Loll, MD  omeprazole (PRILOSEC) 40 MG capsule Take 40 mg by mouth daily.    [provider]  potassium chloride (K-DUR,KLOR-CON) 10 MEQ tablet Take 1 tablet (10 mEq total) by mouth daily. Patient not taking: Reported on 03/29/2018 03/22/18   Demetrios Loll, MD  simvastatin (ZOCOR) 40 MG tablet Take 1 tablet (40 mg total)  by mouth daily at 6 PM. Patient not taking: Reported on 03/29/2018 03/21/18   Demetrios Loll, MD    Inpatient Medications: Scheduled Meds: . aspirin  162 mg Oral Once  . aspirin  81 mg Oral Daily  . enoxaparin (LOVENOX) injection  40 mg Subcutaneous Q24H  . furosemide  20 mg Intravenous Q12H  . insulin aspart  0-15 Units Subcutaneous TID WC  . insulin aspart  0-5 Units Subcutaneous QHS  . lisinopril  10 mg Oral Daily  . pantoprazole  40 mg Oral Daily  . potassium chloride  20 mEq Oral Daily  . simvastatin  40 mg Oral q1800  . sodium chloride flush  3 mL  Intravenous Q12H   Continuous Infusions: . sodium chloride     PRN Meds: sodium chloride, acetaminophen, albuterol, alum & mag hydroxide-simeth, HYDROcodone-acetaminophen, ondansetron (ZOFRAN) IV, sodium chloride flush  Allergies:    Allergies  Allergen Reactions  . Eggs Or Egg-Derived Products Nausea And Vomiting  . Novocain [Procaine]   . Sulfa Antibiotics     Social History:   Social History   Socioeconomic History  . Marital status: Single    Spouse name: Not on file  . Number of children: Not on file  . Years of education: Not on file  . Highest education level: Not on file  Occupational History  . Not on file  Social Needs  . Financial resource strain: Not on file  . Food insecurity:    Worry: Not on file    Inability: Not on file  . Transportation needs:    Medical: Not on file    Non-medical: Not on file  Tobacco Use  . Smoking status: Never Smoker  . Smokeless tobacco: Never Used  Substance and Sexual Activity  . Alcohol use: Not Currently  . Drug use: Not Currently  . Sexual activity: Not on file  Lifestyle  . Physical activity:    Days per week: Not on file    Minutes per session: Not on file  . Stress: Not on file  Relationships  . Social connections:    Talks on phone: Not on file    Gets together: Not on file    Attends religious service: Not on file    Active member of club or organization: Not on file    Attends meetings of clubs or organizations: Not on file    Relationship status: Not on file  . Intimate partner violence:    Fear of current or ex partner: Not on file    Emotionally abused: Not on file    Physically abused: Not on file    Forced sexual activity: Not on file  Other Topics Concern  . Not on file  Social History Narrative  . Not on file    Family History:    Family History  Problem Relation Age of Onset  . Hypertension Mother   . CAD Mother   . CAD Father      ROS:  Please see the history of present illness.    All other ROS reviewed and negative.     Physical Exam/Data:   Vitals:   03/28/18 2056 03/28/18 2058 03/29/18 0445 03/29/18 0822  BP: (!) 144/61  (!) 156/68 (!) 148/65  Pulse: 60  (!) 52 (!) 55  Resp: 18   18  Temp: 98.3 F (36.8 C)  98.2 F (36.8 C) 98.4 F (36.9 C)  TempSrc: Oral  Oral Oral  SpO2: 98%  99% 98%  Weight:  248 lb (112.5 kg) 250 lb 12.8 oz (113.8 kg)   Height:  5\' 6"  (1.676 m) 5\' 6"  (1.676 m)     Intake/Output Summary (Last 24 hours) at 2018-04-12 1308 Last data filed at April 12, 2018 1025 Gross per 24 hour  Intake 480 ml  Output 400 ml  Net 80 ml   Filed Weights   03/28/18 2058 April 12, 2018 0445  Weight: 248 lb (112.5 kg) 250 lb 12.8 oz (113.8 kg)   Body mass index is 40.48 kg/m.  General:    Disheveled and chronically ill, in no acute distress HEENT: normal Lymph: no adenopathy Neck: +JVD Cardiac:  normal S1, S2; RRR; no murmur  Lungs:  Decreased BS at bases, no wheezing, rhonchi or rales  Abd: distended, mildly tender,  Ext: + dependant edema Musculoskeletal:  No deformities, BUE and BLE strength normal and equal Skin: warm and dry  Neuro:  no focal abnormalities noted Psych:  Normal affect   EKG:  The EKG was personally reviewed and demonstrates:  Sinus rhythm 60 bpm, non ischemic changes Telemetry:  Telemetry was personally reviewed and demonstrates:  Sinus rhythm 50s  Relevant CV Studies: Recent echo, myoview, and cardiology consult notes are reviewed  Laboratory Data:  Chemistry Recent Labs  Lab 03/28/18 2108  NA 142  K 3.8  CL 111  CO2 25  GLUCOSE 185*  BUN 15  CREATININE 1.13  CALCIUM 8.9  GFRNONAA >60  GFRAA >60  ANIONGAP 6    Recent Labs  Lab 03/28/18 2108  PROT 6.9  ALBUMIN 3.2*  AST 43*  ALT 21  ALKPHOS 74  BILITOT 1.2   Hematology Recent Labs  Lab 03/28/18 2108  WBC 3.6*  RBC 3.84*  HGB 11.1*  HCT 32.7*  MCV 85.1  MCH 28.9  MCHC 33.9  RDW 17.6*  PLT 132*   Cardiac Enzymes Recent Labs  Lab  03/28/18 2108 April 12, 2018 0550  TROPONINI <0.03 <0.03   No results for input(s): TROPIPOC in the last 168 hours.  BNP Recent Labs  Lab 03/28/18 2108  BNP 132.0*    DDimer No results for input(s): DDIMER in the last 168 hours.  Radiology/Studies:  US Renal  Result Date: April 12, 2018 CLINICAL DATA:  Proteinuria EXAM: RENAL / URINARY TRACT ULTRASOUND COMPLETE COMPARISON:  None. FINDINGS: Right Kidney: Length: 12 cm. Echogenicity within normal limits. No mass or hydronephrosis visualized. 7 mm calcification/stone within the lower pole of the RIGHT kidney. Left Kidney: Length: 10 cm. Echogenicity within normal limits. Lobular configuration of the cortex but no discrete mass identified. No hydronephrosis visualized. Bladder: Appears normal for degree of bladder distention. IMPRESSION: 1. No acute findings.  No hydronephrosis. 2. 7 mm calcification/stone within the lower pole of the RIGHT kidney. 3. Lobular configuration of the LEFT renal cortex, but without discrete mass. Consider confirmation with nonemergent renal protocol CT or MRI if/when renal function allows. Electronically Signed   By: Franki Cabot M.D.   On: 04-12-2018 08:30   Dg Chest Port 1 View  Result Date: 04-12-18 CLINICAL DATA:  Shortness of breath.  Untreated heart failure. EXAM: PORTABLE CHEST 1 VIEW COMPARISON:  Radiograph 03/19/2018 FINDINGS: Unchanged cardiomegaly with aortic tortuosity. Mild vascular congestion without overt pulmonary edema. Minimal left lung base scarring. No focal airspace disease, pleural fluid or pneumothorax. Stable osseous structures. IMPRESSION: Mild vascular congestion.  Unchanged cardiomegaly. Electronically Signed   By: Jeb Levering M.D.   On: 04/12/2018 02:18    Assessment and Plan:   1. SOB, edema Etiology is unclear He  has normal EF and perhaps mild diastolic dysfunction by recent echo.  Medical and dietary noncompliance are likely contributing.  He likely has very high sodium dietary intake.   Sodium restriction is advised as well as compliance with outpatient medical therapy.  For now I would advise IV diuresis.  I am not convinced that [place] would be beneficial and patient would like to avoid at this time. He does have HCV and has not had liver imaging noted in Epic.  His albumen is reduced.  It may be that live dysfunction is contributing to her current presentation.  Could consider liver US to further evaluate, including doppler to evaluate portal flow given his pain.  Ultimately, he will require significant outpatient support systems in order to keep him out of the hospital long term.  Cardiology to see again on Monday.  For questions or updates, please contact [place] Please consult [email or web address] for contact info under Cardiology/STEMI.   Signed, Thompson Grayer, MD  03/29/2018 1:08 PM

## 2018-03-29 NOTE — H&P (Signed)
[facility] [place] NAME: [name]    MR#:  [id number]  DATE OF BIRTH:  1951/06/28  DATE OF ADMISSION:  03/29/2018  PRIMARY CARE PHYSICIAN: Letta Median, MD   REQUESTING/REFERRING PHYSICIAN: Darel Hong, MD  CHIEF COMPLAINT:   Chief Complaint  Patient presents with  . Leg Pain    HISTORY OF PRESENT ILLNESS:  [name]  is a 67 y.o. male with a known history of T2NIDDM, HTN, HLD, COPD p/w bilateral lower extremity edema/pain and generalized weakness. Pt tells me he is homeless. He was admitted to the hospital on 07/25 with similar symptoms, and was evaluated for heart failure; he was diuresed, and an Echocardiogram performed was normal. Pt was subsequently discharged on 07/26. He states he left and did not have a means by which to obtain his medications, so he did not. He states that he has had worsening bilateral lower extremity edema, bilateral lower extremity pain/tenderness, as well as fatigue/malaise/generalized weakness and decreased exercise tolerance since discharge. He also endorses diffuse cramps, myalgias and joint pains. He denies CP, cough/SOB, orthopnea and PND. He is morbidly obese, and has a general constitutional chronically-ill appearance, but does not appear septic/toxic, and is not in acute distress.  Mr. Spelman says he was in prison until earlier this year, and I believe he was released in April 2019. He states he did not receive adequate medical treatment while he was there. He states he has chronic HCV, and was evaluated for treatment, but was told he was not a candidate. He states he was also told he had kidney, heart and lung problems. 03/21/2018 nuclear stress test (-). 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 CXR on present admission demonstrates pulmonary vascular congestion  w/o edema/effusion or active airspace disease. BNP 132.  PAST MEDICAL HISTORY:   Past Medical History:  Diagnosis Date  . Asthma   . COPD (chronic obstructive pulmonary disease) ([place])   . Diabetes mellitus without complication ([place])   . Homelessness   . Hypertension   . Migraine   . Obesity   . Sleep apnea     PAST SURGICAL HISTORY:   Past Surgical History:  Procedure Laterality Date  . CARDIAC CATHETERIZATION    . CHOLECYSTECTOMY    . EYE SURGERY    . INNER EAR SURGERY    . NOSE SURGERY      SOCIAL HISTORY:   Social History   Tobacco Use  . Smoking status: Never Smoker  . Smokeless tobacco: Never Used  Substance Use Topics  . Alcohol use: Not Currently    FAMILY HISTORY:   Family History  Problem Relation Age of Onset  . Hypertension Mother   . CAD Mother   . CAD Father     DRUG ALLERGIES:   Allergies  Allergen Reactions  . Eggs Or Egg-Derived Products Nausea And Vomiting  . Novocain [Procaine]   . Sulfa Antibiotics     REVIEW OF SYSTEMS:   Review of Systems  Constitutional: Positive for malaise/fatigue. Negative for chills, diaphoresis, fever and weight loss.  Eyes: Negative for blurred vision, double vision and photophobia.  Respiratory: Negative for cough, hemoptysis, sputum production, shortness of breath and wheezing.   Cardiovascular: Positive for leg swelling. Negative for chest pain, palpitations, orthopnea, claudication and PND.  Gastrointestinal: Negative for abdominal pain, blood in  stool, constipation, diarrhea, heartburn, melena, nausea and vomiting.  Genitourinary: Negative for dysuria, frequency, hematuria and urgency.  Musculoskeletal: Positive for joint pain and myalgias. Negative for back pain, falls and neck pain.  Skin: Negative for itching and rash.  Neurological: Positive for weakness. Negative for dizziness, tingling, tremors, sensory change, speech change, focal weakness, seizures, loss of consciousness and headaches.    Psychiatric/Behavioral: Negative for memory loss. The patient does not have insomnia.    MEDICATIONS AT HOME:   Prior to Admission medications   Medication Sig Start Date End Date Taking? Authorizing Provider  acetaminophen (TYLENOL) 650 MG CR tablet Take 650 mg by mouth 2 (two) times daily.    [provider]  acetaminophen-codeine (TYLENOL #3) 300-30 MG tablet Take 1 tablet by mouth every 4 (four) hours as needed for moderate pain.    [provider]  albuterol (PROVENTIL HFA;VENTOLIN HFA) 108 (90 Base) MCG/ACT inhaler Inhale 1-2 puffs into the lungs every 6 (six) hours as needed for wheezing or shortness of breath.    [provider]  aluminum-magnesium hydroxide 200-200 MG/5ML suspension Take 30 mLs by mouth every 6 (six) hours as needed for indigestion.    [provider]  aspirin 81 MG chewable tablet Chew 1 tablet (81 mg total) by mouth daily. Patient not taking: Reported on 03/29/2018 03/21/18   Demetrios Loll, MD  furosemide (LASIX) 40 MG tablet Take 1 tablet (40 mg total) by mouth daily. Patient not taking: Reported on 03/29/2018 03/21/18 03/21/19  Demetrios Loll, MD  lisinopril (PRINIVIL,ZESTRIL) 10 MG tablet Take 1 tablet (10 mg total) by mouth daily. Patient not taking: Reported on 03/29/2018 03/21/18   Demetrios Loll, MD  loperamide (IMODIUM) 2 MG capsule Take 2 mg by mouth as needed for diarrhea or loose stools.    [provider]  metFORMIN (GLUCOPHAGE-XR) 500 MG 24 hr tablet Take 2 tablets (1,000 mg total) by mouth 2 (two) times daily. Patient not taking: Reported on 03/29/2018 03/21/18   Demetrios Loll, MD  omeprazole (PRILOSEC) 40 MG capsule Take 40 mg by mouth daily.    [provider]  potassium chloride (K-DUR,KLOR-CON) 10 MEQ tablet Take 1 tablet (10 mEq total) by mouth daily. Patient not taking: Reported on 03/29/2018 03/22/18   Demetrios Loll, MD  simvastatin (ZOCOR) 40 MG tablet Take 1 tablet (40 mg total) by mouth daily at 6 PM. Patient not  taking: Reported on 03/29/2018 03/21/18   Demetrios Loll, MD      VITAL SIGNS:  Blood pressure (!) 156/68, pulse (!) 52, temperature 98.2 F (36.8 C), temperature source Oral, resp. rate 18, height 5\' 6"  (1.676 m), weight 113.8 kg (250 lb 12.8 oz), SpO2 99 %.  PHYSICAL EXAMINATION:  Physical Exam  Constitutional: He is oriented to person, place, and time. He appears well-developed and well-nourished. He is active and cooperative.  Non-toxic appearance. He does not have a sickly appearance. He appears ill. No distress. He is not intubated.  HENT:  Head: Normocephalic and atraumatic.  Mouth/Throat: Oropharynx is clear and moist. No oropharyngeal exudate.  Eyes: Conjunctivae, EOM and lids are normal. No scleral icterus.  Neck: Neck supple. JVD present. No thyromegaly present.  Cardiovascular: Regular rhythm, S1 normal, S2 normal and normal heart sounds.  No extrasystoles are present. Bradycardia present. Exam reveals no gallop, no S3, no S4, no distant heart sounds and no friction rub.  No murmur heard. Pulmonary/Chest: Effort normal. No accessory muscle usage or stridor. No apnea, no tachypnea and no bradypnea. He  is not intubated. No respiratory distress. He has no decreased breath sounds. He has no wheezes. He has no rhonchi. He has rales in the right lower field and the left lower field.  Abdominal: Soft. Bowel sounds are normal. He exhibits no distension. There is no tenderness. There is no rebound and no guarding.  Musculoskeletal: Normal range of motion. He exhibits edema and tenderness.  Lymphadenopathy:    He has no cervical adenopathy.  Neurological: He is alert and oriented to person, place, and time. He is not disoriented.  Skin: Skin is warm and dry. He is not diaphoretic. No erythema.  Psychiatric: He has a normal mood and affect. His speech is normal and behavior is normal. Judgment and thought content normal. Cognition and memory are normal.   2+ B/L LE pitting edema, (+) mild TTP.  (+) JVD. (+) bibasilar fine crackles. LABORATORY PANEL:   CBC Recent Labs  Lab 03/28/18 2108  WBC 3.6*  HGB 11.1*  HCT 32.7*  PLT 132*   ------------------------------------------------------------------------------------------------------------------  Chemistries  Recent Labs  Lab 03/28/18 2108  NA 142  K 3.8  CL 111  CO2 25  GLUCOSE 185*  BUN 15  CREATININE 1.13  CALCIUM 8.9  MG 2.0  AST 43*  ALT 21  ALKPHOS 74  BILITOT 1.2   ------------------------------------------------------------------------------------------------------------------  Cardiac Enzymes Recent Labs  Lab 03/28/18 2108  TROPONINI <0.03   ------------------------------------------------------------------------------------------------------------------  RADIOLOGY:  Dg Chest Port 1 View  Result Date: 03/29/2018 CLINICAL DATA:  Shortness of breath.  Untreated heart failure. EXAM: PORTABLE CHEST 1 VIEW COMPARISON:  Radiograph 03/19/2018 FINDINGS: Unchanged cardiomegaly with aortic tortuosity. Mild vascular congestion without overt pulmonary edema. Minimal left lung base scarring. No focal airspace disease, pleural fluid or pneumothorax. Stable osseous structures. IMPRESSION: Mild vascular congestion.  Unchanged cardiomegaly. Electronically Signed   By: Jeb Levering M.D.   On: 03/29/2018 02:18   IMPRESSION AND PLAN:   A/P: 64M p/w B/L LE edema/pain/TTP, fatigue/malaise/generalized weakness, decreased exercise tolerance, cramps, myalgias, joint aches. Exam (+) bibasilar fine crackles, JVD, 2+ B/L LE pitting edema. BNP 132. CXR (+) cardiomegaly + pulmonary vascular congestion w/o edema/effusion. Recent Echo (07/25) w/ EF 54-65%, (-) diastolic dysfxn, (+) PASP 42. Hyperglycemia (w/ T2NIDDM), hypoalbuminemia, pancytopenia (leukopenia w/o neutropenia, normocytic anemia, thrombocytopenia). -Presentation suggestive of acute R-sided heart failure, despite absence of failure on recent Echo -I suspect pt may  benefit from R heart cath, though pt himself does not like the idea -Presentation may also be caused/perpetuated by liver and kidney pathology -Albumin 3.2 -HCV viral load and genotype pending; will benefit from outpt Hepatology -Urine protein/creatinine and Renal U/S pending -Admit Tele, continuous cardiac monitoring -Continuous pulse ox -s/p Lasix 20mg  IV x1 in ED, c/w prescribed home dose 40mg  PO qD -Trop-I (-) x1, 2x rpt pending -Cardiology consult -KCL27 -c/w prescribed Statin, ACE-I -Not on beta blocker (2/2 bradycardia) -Supplemental O2 -I&O -Daily weight -Sodium and free-water restricted diet -SSI, hold PO antihyperglycemics -Pancytopenia likely 2/2 chronic HCV, liver disease; normocytic anemia likely anemia of chronic disease; no evidence of acute blood loss at present time -K+, Mag, Phos WNL -c/w prescribed home medications/formulary subs -Cardiac diabetic free water restricted diet -Lovenox -Full code -Admission, > 2 midnights; social and logistical considerations are guaranteed to present multiple significant hurdles/barriers to discharge   All the records are reviewed and case discussed with ED provider. Management plans discussed with the patient, family and they are in agreement.  CODE STATUS: Full code  TOTAL TIME TAKING CARE OF THIS  PATIENT: 90 minutes.    Arta Silence M.D on 03/29/2018 at 5:45 AM  Between 7am to 6pm - Pager - [phone]  After 6pm go to [email or web address] - Proofreader  [facility]  Office  [phone]  CC: Primary care physician; Letta Median, MD   Note: This dictation was prepared with Dragon dictation along with smaller phrase technology. Any transcriptional errors that result from this process are unintentional.

## 2018-03-29 NOTE — Clinical Social Work Note (Signed)
Clinical Social Work Assessment  Patient Details  Name: [name] MRN: [id number] Date of Birth: 1951/07/04  Date of referral:  03/29/18               Reason for consult:  Housing Concerns/Homelessness, Legal Concerns                Permission sought to share information with:    Permission granted to share information::  Yes, Verbal Permission Granted  Name::     [place]::     Relationship::     Contact Information:     Housing/Transportation Living arrangements for the past 2 months:  No permanent address Source of Information:  Patient Patient Interpreter Needed:  None Criminal Activity/Legal Involvement Pertinent to Current Situation/Hospitalization:  Yes(Parole officer is [employer] Next apt on Monday Aug 5th at 2:30) Significant Relationships:  None Lives with:  Self Do you feel safe going back to the place where you live?  Yes Need for family participation in patient care:  Yes (Comment)  Care giving concerns:  TBD verbal consent to speak to son in law   Social Worker assessment / plan: LCSW introduced myself to patient. He gave verbal consent to complete assessment. Patient explained he was brought to hospital because of health issues. Patient disclosed he is convicted felony offender for Sexual exploitation of a minor- he has served 9 out of 12 years . Pt is on the community SO Watch  List  10 years on probation in total, he is required to see his probation officer  [name] on Monday Aug 5th at 2:30. Patient has refused or denied access to several resources and is willing to go  [facility] on Sunday. He is agreeable for this LCSW to refer him to [place], [facility], bus tickets, DSS. Patient is not willing to go to SNF or ALF, [place] . LCSW did call  Patient son and Law and they did not pick up. Patient receives SSDI  991.00  Patient does have an appoitmentt with Dr Rebeca Alert. Patient is agreeable to  follow up and gave verbal consent to use "[employer] [place]" application to make referrals. Patient has no phone at this time.  Meeting concluded and answered all patient questions and LCSW listened patient will go to court on Aug 22nd, at 10 am- to have some probation conditions . LCSW will  Provide patient with several resources, and 2 bus tickets.  No further needs patient to DC on Sunday Aug 4th/2019.  Employment status:  Disabled (Comment on whether or not currently receiving Disability)(SSDI 991-.00 per month) Insurance information:  Medicaid In [employer] Care/Medicaid) PT Recommendations:  Not assessed at this time Information / Referral to community resources:   [facility], [employer], Medical laboratory scientific officer and link transit bus tickets,   Patient/Family's Response to care: No family- daughter and son in Sports coach  Patient/Family's Understanding of and Emotional Response to Diagnosis, Current Treatment, and Prognosis:  Good understanding  Emotional Assessment Appearance:  Appears stated age Attitude/Demeanor/Rapport:  Avoidant, Complaining, Guarded Affect (typically observed):  Calm, Frustrated, Guarded Orientation:  Oriented to Self, Oriented to Place, Oriented to  Time, Oriented to Situation Alcohol / Substance use:  Not Applicable Psych involvement (Current and /or in the community):  Yes (Comment)([facility])  Discharge Needs  Concerns to be addressed:  Basic Needs, Homelessness Readmission within the last 30 days:  No Current discharge risk:  Homeless Barriers to Discharge:  (  S) Other(Convicted Felon-SE on a minor)   Jake Fuhrmann M, LCSW 03/29/2018, 3:04 PM

## 2018-03-29 NOTE — Clinical Social Work Note (Signed)
CSW received consult that the patient wants to have independent living placement without "giving up my whole check". CSW will visit the patient when able to advise that no independent living options are available with that stipulation.   Santiago Bumpers, MSW, Latanya Presser [phone]

## 2018-03-30 DIAGNOSIS — I110 Hypertensive heart disease with heart failure: Secondary | ICD-10-CM | POA: Diagnosis not present

## 2018-03-30 DIAGNOSIS — R809 Proteinuria, unspecified: Secondary | ICD-10-CM | POA: Diagnosis not present

## 2018-03-30 LAB — GLUCOSE, CAPILLARY
GLUCOSE-CAPILLARY: 86 mg/dL (ref 70–99)
GLUCOSE-CAPILLARY: 110 mg/dL — AB (ref 70–99)
Glucose-Capillary: 80 mg/dL (ref 70–99)
Glucose-Capillary: 98 mg/dL (ref 70–99)

## 2018-03-30 LAB — BASIC METABOLIC PANEL
ANION GAP: 5 (ref 5–15)
BUN: 17 mg/dL (ref 8–23)
CO2: 29 mmol/L (ref 22–32)
Calcium: 8.7 mg/dL — ABNORMAL LOW (ref 8.9–10.3)
Chloride: 107 mmol/L (ref 98–111)
Creatinine, Ser: 1.3 mg/dL — ABNORMAL HIGH (ref 0.61–1.24)
GFR calc Af Amer: >60 mL/min (ref >60)
GFR calc non Af Amer: 55 mL/min — ABNORMAL LOW (ref >60)
GLUCOSE: 135 mg/dL — AB (ref 70–99)
POTASSIUM: 3.6 mmol/L (ref 3.5–5.1)
Sodium: 141 mmol/L (ref 135–145)

## 2018-03-30 LAB — HIV ANTIBODY (ROUTINE TESTING W REFLEX): HIV Screen 4th Generation wRfx: NONREACTIVE

## 2018-03-30 NOTE — Plan of Care (Signed)

## 2018-03-30 NOTE — Progress Notes (Signed)
[place] [facility] [place] NAME: [name]    MR#:  [id number]  DATE OF BIRTH:  01/22/1951  SUBJECTIVE:   Patient still complains of generalized aches and pains, does not appear to be in respiratory distress.  No other acute events overnight.  REVIEW OF SYSTEMS:    Review of Systems  Constitutional: Negative for chills and fever.  HENT: Negative for congestion and tinnitus.   Eyes: Negative for blurred vision and double vision.  Respiratory: Negative for cough, shortness of breath and wheezing.   Cardiovascular: Positive for leg swelling. Negative for chest pain, orthopnea and PND.  Gastrointestinal: Negative for abdominal pain, diarrhea, nausea and vomiting.  Genitourinary: Negative for dysuria and hematuria.  Neurological: Positive for weakness (Generalized). Negative for dizziness, sensory change and focal weakness.  All other systems reviewed and are negative.   Nutrition: Heart healthy/Carb control Tolerating Diet: Yes Tolerating PT: Ambulatory  DRUG ALLERGIES:   Allergies  Allergen Reactions  . Eggs Or Egg-Derived Products Nausea And Vomiting  . Novocain [Procaine]   . Sulfa Antibiotics     VITALS:  Blood pressure 140/73, pulse (!) 55, temperature 97.8 F (36.6 C), temperature source Oral, resp. rate 18, height 5\' 6"  (1.676 m), weight 114.3 kg (251 lb 14.4 oz), SpO2 98 %.  PHYSICAL EXAMINATION:   Physical Exam  GENERAL:  67 y.o.-year-old umkempt patient lying in bed in no acute distress.  EYES: Pupils equal, round, reactive to light and accommodation. No scleral icterus. Extraocular muscles intact.  HEENT: Head atraumatic, normocephalic. Oropharynx and nasopharynx clear.  NECK:  Supple, no jugular venous distention. No thyroid enlargement, no tenderness.  LUNGS: Normal breath sounds bilaterally, no wheezing, rales, rhonchi. No use of accessory muscles of respiration.  CARDIOVASCULAR: S1, S2 normal. No murmurs, rubs, or  gallops.  ABDOMEN: Soft, nontender, nondistended. Bowel sounds present. No organomegaly or mass.  EXTREMITIES: No cyanosis, clubbing, + 2 edema b/l NEUROLOGIC: Cranial nerves II through XII are intact. No focal Motor or sensory deficits b/l. Globally weak.   PSYCHIATRIC: The patient is alert and oriented x 3.  SKIN: No obvious rash, lesion, or ulcer.    LABORATORY PANEL:   CBC Recent Labs  Lab 03/28/18 2108  WBC 3.6*  HGB 11.1*  HCT 32.7*  PLT 132*   ------------------------------------------------------------------------------------------------------------------  Chemistries  Recent Labs  Lab 03/28/18 2108 03/30/18 0430  NA 142 141  K 3.8 3.6  CL 111 107  CO2 25 29  GLUCOSE 185* 135*  BUN 15 17  CREATININE 1.13 1.30*  CALCIUM 8.9 8.7*  MG 2.0  --   AST 43*  --   ALT 21  --   ALKPHOS 74  --   BILITOT 1.2  --    ------------------------------------------------------------------------------------------------------------------  Cardiac Enzymes Recent Labs  Lab 03/29/18 1358  TROPONINI <0.03   ------------------------------------------------------------------------------------------------------------------  RADIOLOGY:  US Renal  Result Date: 03/29/2018 CLINICAL DATA:  Proteinuria EXAM: RENAL / URINARY TRACT ULTRASOUND COMPLETE COMPARISON:  None. FINDINGS: Right Kidney: Length: 12 cm. Echogenicity within normal limits. No mass or hydronephrosis visualized. 7 mm calcification/stone within the lower pole of the RIGHT kidney. Left Kidney: Length: 10 cm. Echogenicity within normal limits. Lobular configuration of the cortex but no discrete mass identified. No hydronephrosis visualized. Bladder: Appears normal for degree of bladder distention. IMPRESSION: 1. No acute findings.  No hydronephrosis. 2. 7 mm calcification/stone within the lower pole of the RIGHT kidney. 3. Lobular configuration of the LEFT renal cortex, but without discrete  mass. Consider confirmation with  nonemergent renal protocol CT or MRI if/when renal function allows. Electronically Signed   By: Franki Cabot M.D.   On: 03/29/2018 08:30   Dg Chest Port 1 View  Result Date: 03/29/2018 CLINICAL DATA:  Shortness of breath.  Untreated heart failure. EXAM: PORTABLE CHEST 1 VIEW COMPARISON:  Radiograph 03/19/2018 FINDINGS: Unchanged cardiomegaly with aortic tortuosity. Mild vascular congestion without overt pulmonary edema. Minimal left lung base scarring. No focal airspace disease, pleural fluid or pneumothorax. Stable osseous structures. IMPRESSION: Mild vascular congestion.  Unchanged cardiomegaly. Electronically Signed   By: Jeb Levering M.D.   On: 03/29/2018 02:18     ASSESSMENT AND PLAN:   67 year old male with past medical history of COPD, diabetes, hypertension, chronic diastolic CHF obstructive sleep apnea who presents to the hospital due to generalized aches and pains and also shortness of breath and noted to be in mild CHF.  1.  CHF-acute on chronic diastolic dysfunction. -Patient says he could not afford his medications when he got discharged from the hospital even though social work was able to get him help with that. Continue IV Lasix, about 2 L negative since admission.  Appreciate cardiology input and continue current care.  No plans for heart catheterization.  Continue lisinopril.  2.  Diabetes type 2 without complication-continue sliding scale insulin.  Follow blood sugars. -We will resume metformin upon discharge.  3.  Hyperlipidemia-continue simvastatin.  4. GERD - cont. Protonix.   Case discussed with case manager and patient cannot afford his medications.  Patient does not have a safe disposition presently.  We will try and see if we can get the patient his prescriptions in hand prior to him leaving the hospital.  It is unclear where he will be discharged to a he is currently homeless.  Further plan of disposition as per care manager/social work.   All the records are  reviewed and case discussed with Care Management/Social Worker. Management plans discussed with the patient, family and they are in agreement.  CODE STATUS: Full code  DVT Prophylaxis: Lovenox  TOTAL TIME TAKING CARE OF THIS PATIENT: 30 minutes.   POSSIBLE D/C IN 1-2 DAYS, DEPENDING ON CLINICAL CONDITION.  > 50% of time in coordinating care about pt's disposition.   Henreitta Leber M.D on 03/30/2018 at 1:47 PM  Between 7am to 6pm - Pager - [phone]  After 6pm go to [email or web address] - Proofreader  [facility]  Office  [phone]  CC: Primary care physician; Letta Median, MD

## 2018-03-31 DIAGNOSIS — I5033 Acute on chronic diastolic (congestive) heart failure: Secondary | ICD-10-CM | POA: Diagnosis not present

## 2018-03-31 DIAGNOSIS — R809 Proteinuria, unspecified: Secondary | ICD-10-CM | POA: Diagnosis not present

## 2018-03-31 DIAGNOSIS — I110 Hypertensive heart disease with heart failure: Secondary | ICD-10-CM | POA: Diagnosis not present

## 2018-03-31 LAB — BASIC METABOLIC PANEL
Anion gap: 9 (ref 5–15)
BUN: 18 mg/dL (ref 8–23)
CALCIUM: 9 mg/dL (ref 8.9–10.3)
CHLORIDE: 108 mmol/L (ref 98–111)
CO2: 24 mmol/L (ref 22–32)
CREATININE: 1.08 mg/dL (ref 0.61–1.24)
GFR calc Af Amer: >60 mL/min (ref >60)
GFR calc non Af Amer: >60 mL/min (ref >60)
GLUCOSE: 84 mg/dL (ref 70–99)
Potassium: 3.9 mmol/L (ref 3.5–5.1)
Sodium: 141 mmol/L (ref 135–145)

## 2018-03-31 LAB — GLUCOSE, CAPILLARY: Glucose-Capillary: 76 mg/dL (ref 70–99)

## 2018-03-31 MED ORDER — FUROSEMIDE 20 MG PO TABS: 20.0000 mg | ORAL_TABLET | Freq: Two times a day (BID) | ORAL | Status: DC

## 2018-03-31 MED ORDER — OMEPRAZOLE 40 MG PO CPDR: 40.0000 mg | DELAYED_RELEASE_CAPSULE | Freq: Every morning | ORAL | 0 refills | Status: DC

## 2018-03-31 MED ORDER — HYDROCODONE-ACETAMINOPHEN 5-325 MG PO TABS: 1.0000 | ORAL_TABLET | Freq: Three times a day (TID) | ORAL | 0 refills | Status: DC | PRN

## 2018-03-31 MED ORDER — LISINOPRIL 10 MG PO TABS: 10.0000 mg | ORAL_TABLET | Freq: Every day | ORAL | 0 refills | Status: DC

## 2018-03-31 MED ORDER — ALBUTEROL SULFATE HFA 108 (90 BASE) MCG/ACT IN AERS: 1.0000 | INHALATION_SPRAY | Freq: Four times a day (QID) | RESPIRATORY_TRACT | 0 refills | Status: DC | PRN

## 2018-03-31 MED ORDER — SIMVASTATIN 40 MG PO TABS: 40.0000 mg | ORAL_TABLET | Freq: Every day | ORAL | 0 refills | Status: DC

## 2018-03-31 MED ORDER — FUROSEMIDE 20 MG PO TABS: 20.0000 mg | ORAL_TABLET | Freq: Two times a day (BID) | ORAL | 0 refills | Status: DC

## 2018-03-31 MED ORDER — ACETAMINOPHEN ER 650 MG PO TBCR: 650.0000 mg | EXTENDED_RELEASE_TABLET | Freq: Two times a day (BID) | ORAL | Status: DC | PRN

## 2018-03-31 MED ORDER — METFORMIN HCL ER 500 MG PO TB24: 1000.0000 mg | ORAL_TABLET | Freq: Two times a day (BID) | ORAL | 1 refills | Status: DC

## 2018-03-31 MED ORDER — POTASSIUM CHLORIDE CRYS ER 10 MEQ PO TBCR: 10.0000 meq | EXTENDED_RELEASE_TABLET | Freq: Every day | ORAL | 0 refills | Status: DC

## 2018-03-31 NOTE — Discharge Summary (Signed)
[facility] NAME: [name]    MR#:  [id number]  DATE OF BIRTH:  1950/09/06  DATE OF ADMISSION:  03/29/2018 ADMITTING PHYSICIAN: Arta Silence, MD  DATE OF DISCHARGE: 03/31/18  PRIMARY CARE PHYSICIAN: Letta Median, MD    ADMISSION DIAGNOSIS:  Proteinuria [R80.9] Acute pulmonary edema (HCC) [J81.0] Bilateral lower extremity edema [R60.0]  DISCHARGE DIAGNOSIS:  Active Problems:   Bilateral leg pain   SECONDARY DIAGNOSIS:   Past Medical History:  Diagnosis Date  . Asthma   . COPD (chronic obstructive pulmonary disease) ([facility])   . Diabetes mellitus without complication ([facility])   . Homelessness   . Hypertension   . Migraine   . Obesity   . Sleep apnea     HOSPITAL COURSE:   HPI [name]  is a 67 y.o. male with a known history of T2NIDDM, HTN, HLD, COPD p/w bilateral lower extremity edema/pain and generalized weakness. Pt tells me he is homeless. He was admitted to the hospital on 07/25 with similar symptoms, and was evaluated for heart failure; he was diuresed, and an Echocardiogram performed was normal. Pt was subsequently discharged on 07/26. He states he left and did not have a means by which to obtain his medications, so he did not. He states that he has had worsening bilateral lower extremity edema, bilateral lower extremity pain/tenderness, as well as fatigue/malaise/generalized weakness and decreased exercise tolerance since discharge. He also endorses diffuse cramps, myalgias and joint pains. He denies CP, cough/SOB, orthopnea and PND. He is morbidly obese, and has a general constitutional chronically-ill appearance, but does not appear septic/toxic, and is not in acute distress.  [name] says he was in prison until earlier this year, and I believe he was released in April 2019. He states he did not receive adequate medical treatment while he was there. 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 He states he was also told he had kidney, heart and lung problems. 03/21/2018 nuclear stress test (-). 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 CXR on present admission demonstrates pulmonary vascular congestion w/o edema/effusion or active airspace disease. BNP 132.  1.  CHF-acute on chronic diastolic dysfunction. -Clinically doing fine.  Okay to discharge patient from cardiology standpoint -Daily weight monitoring intake and output -Outpatient CHF clinic follow-up and cardiology follow-up -Discharge him with p.o. Lasix 20 mg twice daily -Patient says he could not afford his medications when he got discharged from the hospital even though social work was able to get him help with that. High risk for readmission, Net negative output 3.3 L since admission.   Appreciate cardiology input and continue current care.  No plans for heart catheterization.  Continue lisinopril. Lower extremity edema keep legs elevated -If symptoms persist/worsen consider RHC to better assess volume status as an outpatient     2.  Diabetes type 2 without complication-continue sliding scale insulin.  Follow blood sugars. -We will resume metformin upon discharge.  3.  Pulmonary hypertension Outpatient follow-up with pulmonology continue Lasix Pulmonary rehab  4.  History of a hepatitis C virus Outpatient follow-up with gastroenterology   5.Hyperlipidemia-continue simvastatin.  6. GERD - cont. Protonix.   7.Homelessness/recent incarceration: -Case Freight forwarder and social worker on board -Patient indicates, "I got to leave today.  I have a parole hearing at 2 today"   Given the noncompliance and social issues he is at high risk for readmission   Case discussed with case manager and social worker patient says he cannot  afford his medications though he has insurance. Further plan of disposition as per care manager/social work.  And [place].  He has a parole appointment at 3 PM today Taxi voucher will be provided for the transportation.  Transportation is an ongoing issue I am not sure whether patient can keep up with the appointments and outpatient follow-up but I have reinforced the importance of being compliant with his medications and follow-up appointments.  He verbalized understanding Okay to discharge patient from cardiology, case management and social worker standpoint  DISCHARGE CONDITIONS:   fair  CONSULTS OBTAINED:  Treatment Team:  Arta Silence, MD Thompson Grayer, MD   PROCEDURES  None   DRUG ALLERGIES:   Allergies  Allergen Reactions  . Eggs Or Egg-Derived Products Nausea And Vomiting  . Novocain [Procaine]   . Sulfa Antibiotics     DISCHARGE MEDICATIONS:   Allergies as of 03/31/2018      Reactions   Eggs Or Egg-derived Products Nausea And Vomiting   Novocain [procaine]    Sulfa Antibiotics       Medication List    STOP taking these medications   acetaminophen-codeine 300-30 MG tablet Commonly known as:  TYLENOL #3   loperamide 2 MG capsule Commonly known as:  IMODIUM     TAKE these medications   acetaminophen 650 MG CR tablet Commonly known as:  TYLENOL Take 1 tablet (650 mg total) by mouth 2 (two) times daily as needed for pain. What changed:    when to take this  reasons to take this   albuterol 108 (90 Base) MCG/ACT inhaler Commonly known as:  PROVENTIL HFA;VENTOLIN HFA Inhale 1-2 puffs into the lungs every 6 (six) hours as needed for wheezing or shortness of breath.   aluminum-magnesium hydroxide 200-200 MG/5ML suspension Take 30 mLs by mouth every 6 (six) hours as needed for indigestion.   aspirin 81 MG chewable tablet Chew 1 tablet (81 mg total) by mouth daily.    furosemide 20 MG tablet Commonly known as:  LASIX Take 1 tablet (20 mg total) by mouth 2 (two) times daily. What changed:    medication strength  how much to take  when to take this   HYDROcodone-acetaminophen 5-325 MG tablet Commonly known as:  NORCO/VICODIN Take 1-2 tablets by mouth 3 (three) times daily as needed for up to 10 doses for moderate pain or severe pain.   lisinopril 10 MG tablet Commonly known as:  PRINIVIL,ZESTRIL Take 1 tablet (10 mg total) by mouth daily.   metFORMIN 500 MG 24 hr tablet Commonly known as:  GLUCOPHAGE-XR Take 2 tablets (1,000 mg total) by mouth 2 (two) times daily.   omeprazole 40 MG capsule Commonly known as:  PRILOSEC Take 1 capsule (40 mg total) by mouth AC breakfast. What changed:  when to take this   potassium chloride 10 MEQ tablet Commonly known as:  K-DUR,KLOR-CON Take 1 tablet (10 mEq total) by mouth daily.   simvastatin 40 MG tablet Commonly known as:  ZOCOR Take 1 tablet (40 mg total) by mouth daily at 6 PM.        DISCHARGE INSTRUCTIONS:   Follow-up with primary care physician in 2 to 3 days Follow-up with cardiology  Dr. [name] as scheduled Daily weight monitoring, intake and output monitoring Follow-up with CHF clinic as recommended Follow-up with cardiopulmonary rehab in 2 to 3 days Follow-up with pulmonology Dr. Ashby Dawes in 2 weeks  follow-up with gastroenterology Dr. Vicente Males in 1 to 2 weeks   DIET:  Cardiac diet and Diabetic diet  DISCHARGE CONDITION:  Fair  ACTIVITY:  Activity as tolerated  OXYGEN:  Home Oxygen: No.   Oxygen Delivery: room air  DISCHARGE LOCATION:  [place] and [place]  If you experience worsening of your admission symptoms, develop shortness of breath, life threatening emergency, suicidal or homicidal thoughts you must seek medical attention immediately by calling 911 or calling your MD immediately  if symptoms less severe.  You Must read complete  instructions/literature along with all the possible adverse reactions/side effects for all the Medicines you take and that have been prescribed to you. Take any new Medicines after you have completely understood and accpet all the possible adverse reactions/side effects.   Please note  You were cared for by a hospitalist during your hospital stay. If you have any questions about your discharge medications or the care you received while you were in the hospital after you are discharged, you can call the unit and asked to speak with the hospitalist on call if the hospitalist that took care of you is not available. Once you are discharged, your primary care physician will handle any further medical issues. Please note that NO REFILLS for any discharge medications will be authorized once you are discharged, as it is imperative that you return to your primary care physician (or establish a relationship with a primary care physician if you do not have one) for your aftercare needs so that they can reassess your need for medications and monitor your lab values.     Today  Chief Complaint  Patient presents with  . Leg Pain   Patient denies any chest pain or shortness of breath.  Wants to leave as soon as possible as he has parole appointment at 2:30 PM today Okay to discharge patient from cardiology standpoint.  Case management social worker involved  ROS:  CONSTITUTIONAL: Denies fevers, chills. Denies any fatigue, weakness.  EYES: Denies blurry vision, double vision, eye pain. EARS, NOSE, THROAT: Denies tinnitus, ear pain, hearing loss. RESPIRATORY: Denies cough, wheeze, shortness of breath.  CARDIOVASCULAR: Denies chest pain, palpitations,  Has ch edema.  GASTROINTESTINAL: Denies nausea, vomiting, diarrhea, abdominal pain. Denies bright red blood per rectum. GENITOURINARY: Denies dysuria, hematuria. ENDOCRINE: Denies nocturia or thyroid problems. HEMATOLOGIC AND LYMPHATIC: Denies easy bruising or  bleeding. SKIN: Denies rash or lesion. MUSCULOSKELETAL: Denies pain in neck, back, shoulder, knees, hips or arthritic symptoms.  NEUROLOGIC: Denies paralysis, paresthesias.  PSYCHIATRIC: Denies anxiety or depressive symptoms.   VITAL SIGNS:  Blood pressure 131/67, pulse (!) 50, temperature 98 F (36.7 C), temperature source Oral, resp. rate 18, height 5\' 6"  (1.676 m), weight 113.7 kg (250 lb 11.2 oz), SpO2 96 %.  I/O:    Intake/Output Summary (Last 24 hours) at 03/31/2018 1231 Last data filed at 03/31/2018 1006 Gross per 24 hour  Intake 960 ml  Output 1700 ml  Net -740 ml    PHYSICAL EXAMINATION:  GENERAL:  67 y.o.-year-old patient lying in the bed with no acute distress.  EYES: Pupils equal, round, reactive to light and accommodation. No scleral icterus. Extraocular muscles intact.  HEENT: Head atraumatic, normocephalic. Oropharynx and nasopharynx clear.  NECK:  Supple, no  jugular venous distention. No thyroid enlargement, no tenderness.  LUNGS: Normal breath sounds bilaterally, no wheezing, rales,rhonchi or crepitation. No use of accessory muscles of respiration.  CARDIOVASCULAR: S1, S2 normal. No murmurs, rubs, or gallops.  ABDOMEN: Soft, non-tender, non-distended. Bowel sounds present. No organomegaly or mass.  EXTREMITIES: trace  pedal edema, no cyanosis, or clubbing.  NEUROLOGIC: Cranial nerves II through XII are intact.  Sensation intact. Gait not checked.  PSYCHIATRIC: The patient is alert and oriented x 3.  SKIN: No obvious rash, lesion, or ulcer.   DATA REVIEW:   CBC Recent Labs  Lab 03/28/18 2108  WBC 3.6*  HGB 11.1*  HCT 32.7*  PLT 132*    Chemistries  Recent Labs  Lab 03/28/18 2108  03/31/18 0509  NA 142   < > 141  K 3.8   < > 3.9  CL 111   < > 108  CO2 25   < > 24  GLUCOSE 185*   < > 84  BUN 15   < > 18  CREATININE 1.13   < > 1.08  CALCIUM 8.9   < > 9.0  MG 2.0  --   --   AST 43*  --   --   ALT 21  --   --   ALKPHOS 74  --   --   BILITOT 1.2   --   --    < > = values in this interval not displayed.    Cardiac Enzymes Recent Labs  Lab 03/29/18 1358  TROPONINI <0.03    Microbiology Results  No results found for this or any previous visit.  RADIOLOGY:  US Renal  Result Date: 03/29/2018 CLINICAL DATA:  Proteinuria EXAM: RENAL / URINARY TRACT ULTRASOUND COMPLETE COMPARISON:  None. FINDINGS: Right Kidney: Length: 12 cm. Echogenicity within normal limits. No mass or hydronephrosis visualized. 7 mm calcification/stone within the lower pole of the RIGHT kidney. Left Kidney: Length: 10 cm. Echogenicity within normal limits. Lobular configuration of the cortex but no discrete mass identified. No hydronephrosis visualized. Bladder: Appears normal for degree of bladder distention. IMPRESSION: 1. No acute findings.  No hydronephrosis. 2. 7 mm calcification/stone within the lower pole of the RIGHT kidney. 3. Lobular configuration of the LEFT renal cortex, but without discrete mass. Consider confirmation with nonemergent renal protocol CT or MRI if/when renal function allows. Electronically Signed   By: Franki Cabot M.D.   On: 03/29/2018 08:30   Dg Chest Port 1 View  Result Date: 03/29/2018 CLINICAL DATA:  Shortness of breath.  Untreated heart failure. EXAM: PORTABLE CHEST 1 VIEW COMPARISON:  Radiograph 03/19/2018 FINDINGS: Unchanged cardiomegaly with aortic tortuosity. Mild vascular congestion without overt pulmonary edema. Minimal left lung base scarring. No focal airspace disease, pleural fluid or pneumothorax. Stable osseous structures. IMPRESSION: Mild vascular congestion.  Unchanged cardiomegaly. Electronically Signed   By: Jeb Levering M.D.   On: 03/29/2018 02:18    EKG:   Orders placed or performed during the hospital encounter of 03/29/18  . ED EKG  . ED EKG      Management plans discussed with the patient, family and they are in agreement.  CODE STATUS:     Code Status Orders  (From admission, onward)        Start      Ordered   03/29/18 0426  Full code  Continuous     03/29/18 0425    Code Status History    Date Active Date Inactive Code Status Order ID Comments  User Context   03/20/2018 0300 03/22/2018 0018 Full Code [id number]  Harrie Foreman, MD Inpatient      TOTAL TIME TAKING CARE OF THIS PATIENT: 45 minutes.   Note: This dictation was prepared with Dragon dictation along with smaller phrase technology. Any transcriptional errors that result from this process are unintentional.   @[facility] @  on 03/31/2018 at 12:31 PM  Between 7am to 6pm - Pager - [phone]  After 6pm go to [email or web address] - password [email or web address] [facility]  Office  [phone]  CC: Primary care physician; Letta Median, MD

## 2018-03-31 NOTE — Progress Notes (Signed)
Patient alert and oriented, vss no complaints of pain.  Taxi voucher given.  D/c telemetry and piv.  Escorted out of hospital via wheelchair by nursing staff.

## 2018-03-31 NOTE — Clinical Social Work Note (Addendum)
CSW met with patient to discuss his discharge plan.  Patient has a parole office appointment at 3pm in [place], [place] informed him that [name] can provided a cab vouch to the office, as long as he does not leave AMA.  Patient expressed concerns about not being able to get his medications, CSW notified case manager.  Weekend CSW provided resources for patient for boarding housing and legal advice.  This CSW to sign off, please reconsult if social work needs arise.  4:30pm Patient requested a cab voucher to [facility] to get his medications instead of the parole office due to a change in the date he is supposed to go.  CSW gave bedside nurse an updated taxi voucher along with a bus pass to give to patient.  CSW signing off  [name], MSW, [place]  03/31/2018 12:14 PM

## 2018-03-31 NOTE — Discharge Instructions (Signed)
Follow-up with primary care physician in 2 to 3 days Follow-up with cardiology Dr. [name] as scheduled Daily weight monitoring, intake and output monitoring Follow-up with CHF clinic as recommended Follow-up with cardiopulmonary rehab in 2 to 3 days Follow-up with pulmonology Dr. Ashby Dawes in 2 weeks  follow-up with gastroenterology Dr. Vicente Males in 1 to 2 weeks

## 2018-03-31 NOTE — Progress Notes (Signed)
Progress Note  Patient Name: [name] Date of Encounter: 03/31/2018  Primary Cardiologist: [name]  Subjective   SOB improved. Patient reports, "I got to go today. I have a parole hearing at 2." Documented UOP of 1.1 L for the past 24 hours with a net - 3.3 L for the admission. Renal function improved. Weight 251-->250 pounds.   Inpatient Medications    Scheduled Meds: . aspirin  162 mg Oral Once  . aspirin  81 mg Oral Daily  . enoxaparin (LOVENOX) injection  40 mg Subcutaneous Q24H  . furosemide  20 mg Intravenous Q12H  . insulin aspart  0-15 Units Subcutaneous TID WC  . insulin aspart  0-5 Units Subcutaneous QHS  . lisinopril  10 mg Oral Daily  . pantoprazole  40 mg Oral Daily  . potassium chloride  20 mEq Oral Daily  . simvastatin  40 mg Oral q1800  . sodium chloride flush  3 mL Intravenous Q12H   Continuous Infusions: . sodium chloride     PRN Meds: sodium chloride, acetaminophen, albuterol, alum & mag hydroxide-simeth, HYDROcodone-acetaminophen, ondansetron (ZOFRAN) IV, sodium chloride flush   Vital Signs    Vitals:   03/30/18 1412 03/30/18 1648 03/30/18 1942 03/31/18 0446  BP: (!) 151/77 (!) 116/56 (!) 164/69 131/67  Pulse: (!) 48 (!) 49 (!) 52 (!) 50  Resp:  18    Temp:  98.1 F (36.7 C) 97.7 F (36.5 C) 98 F (36.7 C)  TempSrc:  Oral Oral Oral  SpO2:  96% 99% 96%  Weight:    250 lb 11.2 oz (113.7 kg)  Height:        Intake/Output Summary (Last 24 hours) at 03/31/2018 0721 Last data filed at 03/31/2018 0451 Gross per 24 hour  Intake 480 ml  Output 1650 ml  Net -1170 ml   Filed Weights   03/29/18 0445 03/30/18 0414 03/31/18 0446  Weight: 250 lb 12.8 oz (113.8 kg) 251 lb 14.4 oz (114.3 kg) 250 lb 11.2 oz (113.7 kg)    Telemetry    Sinus bradycardia, upper 40s to 50s bpm - Personally Reviewed  ECG    n/a - Personally Reviewed  Physical Exam   GEN: No acute distress.   Neck: No JVD. Cardiac: Bradycardic, II/VI systolic murmur at the  apex, no rubs, or gallops.  Respiratory: Clear to auscultation bilaterally.  GI: Soft, nontender, non-distended.   MS: No edema; No deformity. Neuro:  Alert and oriented x 3; Nonfocal.  Psych: Normal affect.  Labs    Chemistry Recent Labs  Lab 03/28/18 2108 03/30/18 0430 03/31/18 0509  NA 142 141 141  K 3.8 3.6 3.9  CL 111 107 108  CO2 25 29 24   GLUCOSE 185* 135* 84  BUN 15 17 18   CREATININE 1.13 1.30* 1.08  CALCIUM 8.9 8.7* 9.0  PROT 6.9  --   --   ALBUMIN 3.2*  --   --   AST 43*  --   --   ALT 21  --   --   ALKPHOS 74  --   --   BILITOT 1.2  --   --   GFRNONAA >60 55* >60  GFRAA >60 >60 >60  ANIONGAP 6 5 9      Hematology Recent Labs  Lab 03/28/18 2108  WBC 3.6*  RBC 3.84*  HGB 11.1*  HCT 32.7*  MCV 85.1  MCH 28.9  MCHC 33.9  RDW 17.6*  PLT 132*    Cardiac Enzymes Recent Labs  Lab 03/28/18 2108 03/29/18 0550 03/29/18 1358  TROPONINI <0.03 <0.03 <0.03   No results for input(s): TROPIPOC in the last 168 hours.   BNP Recent Labs  Lab 03/28/18 2108  BNP 132.0*     DDimer No results for input(s): DDIMER in the last 168 hours.   Radiology    US Renal  Result Date: 03/29/2018 IMPRESSION: 1. No acute findings.  No hydronephrosis. 2. 7 mm calcification/stone within the lower pole of the RIGHT kidney. 3. Lobular configuration of the LEFT renal cortex, but without discrete mass. Consider confirmation with nonemergent renal protocol CT or MRI if/when renal function allows. Electronically Signed   By: Franki Cabot M.D.   On: 03/29/2018 08:30    Cardiac Studies   Echo 03/20/2018: Study Conclusions  - Left ventricle: The cavity size was mildly dilated. Systolic   function was normal. The estimated ejection fraction was in the   range of 60% to 65%. Wall motion was normal; there were no   regional wall motion abnormalities. Left ventricular diastolic   function parameters were normal. - Mitral valve: There was mild to moderate regurgitation. -  Left atrium: The atrium was mildly dilated. - Right ventricle: Systolic function was normal. - Pulmonary arteries: Systolic pressure was mildly elevated PA peak   pressure: 42 mm Hg (S).  Patient Profile     67 y.o. male with history of chronic diastolic CHF, pulmonary hypertension, COPD, DM2, HTN, migraine disorder, obesity, OSA not compliant with CPAP, homelessness, and recent incarceration who was admitted just the past week returned to [facility] for continued leg swelling, leg pain, and SOB.  Assessment & Plan    1. Pulmonary hypertension with possible acute on chronic diastolic CHF: -Likely in the setting of his underlying COPD and untreated OSA -SOB and leg edema improved -Remains in IV Lasix 20 mg bid, will transition to PO Lasix 20 mg bid -If symptoms persist/worsen consider RHC to better assess volume status as an outpatient  -Outpatient follow up with pulmonology    2. Leg edema: -Cannot exclude small component of CHF, though this is less likely  -More likely in the setting of his hypoalbuminemia and possible liver dysfunction -Workup per IM  3. HCV: -Imaging per IM  4. Homelessness/recent incarceration: -Case manager on board -Without a definitive outpatient plan, he will continue to bounce back frequently  -Patient indicates, "I got to leave today. I have a parole hearing at 2 today"  5. AKI: -Improved   6. HTN: -Improved  -Continue current medications   7. HLD: -Simvastatin   For questions or updates, please contact [facility] Please consult [email or web address] for contact info under Cardiology/STEMI.    Signed, Christell Faith, PA-C [place] Pager: [phone] 03/31/2018, 7:21 AM

## 2018-03-31 NOTE — Care Management (Signed)
CM confirmed that patient has an active medicare united health care and confirmed with [place] [facility] [employer] that patient has medicaid. Has insurance and coverage for medications through [facility].  He is not eligible for [facility].  Discussed whether patient has had to register as sexual offender and if there are restrictions as to where he can go with csw.

## 2018-03-31 NOTE — Progress Notes (Signed)
Patient threatening to leave AMA if he doesn't get discharge orders by 10am.  Paged Dr. Margaretmary Eddy

## 2018-03-31 NOTE — Care Management (Signed)
CM discussed with patient that he has insurance to cover meds and most are on the [facility] four dollar list.  He says the lisinopril that CM provided last week "got thrown out when I left the house."  CM discussed with patient that he informed CM last discharge he did not have a room anywhere. He says he is going to his sister in laws home at discharge.  Says he will go to [facility] to get his meds filled.

## 2018-04-02 LAB — HEPATITIS C GENOTYPE

## 2018-04-02 LAB — HCV RNA QUANT RFLX ULTRA OR GENOTYP
HCV RNA QNT(LOG COPY/ML): 6.528 {Log_IU}/mL
HepC Qn: 3370000 IU/mL

## 2018-04-06 NOTE — Progress Notes (Signed)
Cardiology Office Note  Date:  04/07/2018   ID:  [name], [name] 1951/08/17, MRN [id number]  PCP:  Letta Median, MD   Chief Complaint  Patient presents with  . Other    Hospital follow up. seen [name] in hospital for Dyspnea on Exertion and Chest pain. Patient c/o still having chest pain when walking, SOB and swelling when walking. Meds reviewed verbally with patient.     HPI:  [name] a 67 y.o.malewith  CHF,  COPD,  DM2,  HTN,  migraine disorder,  obesity,  OSA not compliant with CPAP,  homelessness, and recent incarceration who is admitted with ongoing edema, leg pain, and SOB.  He presents for follow-up of his chronic diastolic CHF  Poor diet as an outpatient Homeless Reports he is unable to get into various places given his previous criminal record Gets most of his food from shelters, high salt intake He does report high fluid intake, mouth is always dry Taking extra Lasix for leg swelling Weight continues to trend higher over the past several months  Lab work reviewed showing mildly elevated BNP 302 Albumin 2.8 Chest x-ray all atherosclerosis, otherwise no acute process  Echocardiogram reviewed previously by myself showing normal LV function  Mildly elevated right heart pressures 40 mmHg Mild to moderate mitral valve regurgitation Results discussed with him Medical management recommended  EKG personally reviewed by myself on todays visit Shows sinus bradycardia rate 52 bpm no significant ST or T-wave changes   PMH:   has a past medical history of Asthma, COPD (chronic obstructive pulmonary disease) ([place]), Diabetes mellitus without complication ([place]), Homelessness, Hypertension, Migraine, Obesity, and Sleep apnea.  PSH:    Past Surgical History:  Procedure Laterality Date  . CARDIAC CATHETERIZATION    . CHOLECYSTECTOMY    . EYE SURGERY    . INNER EAR SURGERY    . NOSE SURGERY      Current Outpatient Medications  Medication Sig  Dispense Refill  . acetaminophen (TYLENOL) 650 MG CR tablet Take 1 tablet (650 mg total) by mouth 2 (two) times daily as needed for pain.    Marland Kitchen albuterol (PROVENTIL HFA;VENTOLIN HFA) 108 (90 Base) MCG/ACT inhaler Inhale 1-2 puffs into the lungs every 6 (six) hours as needed for wheezing or shortness of breath. 1 Inhaler 0  . aluminum-magnesium hydroxide 200-200 MG/5ML suspension Take 30 mLs by mouth every 6 (six) hours as needed for indigestion.    Marland Kitchen aspirin 81 MG chewable tablet Chew 1 tablet (81 mg total) by mouth daily. 30 tablet 1  . Dextrose, Diabetic Use, (DEXTROSE PO) Take by mouth.    . furosemide (LASIX) 40 MG tablet Take 1 tablet (40 mg total) by mouth 2 (two) times daily. 60 tablet 11  . HYDROcodone-acetaminophen (NORCO/VICODIN) 5-325 MG tablet Take 1-2 tablets by mouth 3 (three) times daily as needed for up to 10 doses for moderate pain or severe pain. 10 tablet 0  . lisinopril (PRINIVIL,ZESTRIL) 10 MG tablet Take 1 tablet (10 mg total) by mouth daily. 30 tablet 0  . loperamide (LOPERAMIDE A-D) 2 MG tablet Take 4 mg by mouth daily.    . meclizine (ANTIVERT) 25 MG tablet Take 25 mg by mouth daily.    . metFORMIN (GLUCOPHAGE-XR) 500 MG 24 hr tablet Take 2 tablets (1,000 mg total) by mouth 2 (two) times daily. 60 tablet 1  . omeprazole (PRILOSEC) 40 MG capsule Take 1 capsule (40 mg total) by mouth AC breakfast. 30 capsule 0  .  potassium chloride (K-DUR,KLOR-CON) 20 MEQ tablet Take 1 tablet (20 mEq total) by mouth daily. 30 tablet 11  . promethazine (PHENERGAN) 25 MG tablet Take 25 mg by mouth every 6 (six) hours as needed for nausea or vomiting.    . simvastatin (ZOCOR) 40 MG tablet Take 1 tablet (40 mg total) by mouth daily at 6 PM. 30 tablet 11  . tamsulosin (FLOMAX) 0.4 MG CAPS capsule Take 0.4 mg by mouth daily.     No current facility-administered medications for this visit.      Allergies:   Eggs or egg-derived products; Novocain [procaine]; and Sulfa antibiotics   Social  History:  The patient  reports that he has never smoked. He has never used smokeless tobacco. He reports that he drank alcohol. He reports that he has current or past drug history.   Family History:   family history includes CAD in his father and mother; Hypertension in his mother.    Review of Systems: Review of Systems  Constitutional: Negative.   Respiratory: Negative.   Cardiovascular: Negative.   Gastrointestinal: Negative.   Musculoskeletal: Negative.   Neurological: Negative.   Psychiatric/Behavioral: Negative.   All other systems reviewed and are negative.    PHYSICAL EXAM: VS:  BP (!) 146/76   Pulse (!) 52   Ht 5\' 6"  (1.676 m)   Wt 254 lb (115.2 kg)   BMI 41.00 kg/m  , BMI Body mass index is 41 kg/m. GEN: Well nourished, well developed, in no acute distress  HEENT: normal  Neck: no JVD, carotid bruits, or masses Cardiac: RRR; no murmurs, rubs, or gallops,no edema  Respiratory:  clear to auscultation bilaterally, normal work of breathing GI: soft, nontender, nondistended, + BS MS: no deformity or atrophy  Skin: warm and dry, no rash Neuro:  Strength and sensation are intact Psych: euthymic mood, full affect    Recent Labs: 03/20/2018: TSH 1.522 03/28/2018: ALT 21; B Natriuretic Peptide 132.0; Hemoglobin 11.1; Magnesium 2.0; Platelets 132 03/31/2018: BUN 18; Creatinine, Ser 1.08; Potassium 3.9; Sodium 141    Lipid Panel No results found for: CHOL, HDL, LDLCALC, TRIG    Wt Readings from Last 3 Encounters:  04/07/18 254 lb (115.2 kg)  03/31/18 250 lb 11.2 oz (113.7 kg)  03/21/18 248 lb 3.2 oz (112.6 kg)       ASSESSMENT AND PLAN:  Chronic diastolic CHF (congestive heart failure) (HCC)  Shortness of breath  Anemia, unspecified type  Homelessness   1) chest pain Atypical in nature ardiac enzymes negative, EKG benign Aortic atherosclerosis seen on chest x-ray Stress test with no ischemia 03/21/2018  2) shortness of breath Suspect secondary to  underlying obesity deconditioning Normal ejection fraction  mild diastolic CHF,  Continued CHF education, needs to modify his salt and fluid intake Lasix 40 daily Extra Lasix as needed for swelling and weight gain Exacerbated by high salt and fluid intake  3) anemia Exacerbated by poor diet  4) Homelessness Seen by case manager in the hospital Limited in options given previous felony convictions  Disposition:   F/U  6 months   Total encounter time more than 45 minutes  Greater than 50% was spent in counseling and coordination of care with the patient   No orders of the defined types were placed in this encounter.    Signed, Esmond Plants, M.D., Ph.D. 04/07/2018  [place], [place]

## 2018-04-07 ENCOUNTER — Ambulatory Visit (INDEPENDENT_AMBULATORY_CARE_PROVIDER_SITE_OTHER): Payer: Medicare Other | Admitting: Cardiovascular Disease

## 2018-04-07 ENCOUNTER — Encounter

## 2018-04-07 ENCOUNTER — Encounter: Payer: Self-pay | Admitting: Cardiovascular Disease

## 2018-04-07 VITALS — BP 146/76 | HR 52 | Ht 66.0 in | Wt 254.0 lb

## 2018-04-07 DIAGNOSIS — Z5900 Homelessness unspecified: Secondary | ICD-10-CM

## 2018-04-07 DIAGNOSIS — D649 Anemia, unspecified: Secondary | ICD-10-CM | POA: Insufficient documentation

## 2018-04-07 DIAGNOSIS — I5032 Chronic diastolic (congestive) heart failure: Secondary | ICD-10-CM | POA: Diagnosis not present

## 2018-04-07 DIAGNOSIS — R0602 Shortness of breath: Secondary | ICD-10-CM | POA: Insufficient documentation

## 2018-04-07 MED ORDER — SIMVASTATIN 40 MG PO TABS: 40.0000 mg | ORAL_TABLET | Freq: Every day | ORAL | 11 refills | Status: DC

## 2018-04-07 MED ORDER — POTASSIUM CHLORIDE CRYS ER 20 MEQ PO TBCR: 20.0000 meq | EXTENDED_RELEASE_TABLET | Freq: Every day | ORAL | 11 refills | Status: DC

## 2018-04-07 MED ORDER — FUROSEMIDE 40 MG PO TABS: 40.0000 mg | ORAL_TABLET | Freq: Two times a day (BID) | ORAL | 11 refills | Status: DC

## 2018-04-07 NOTE — Patient Instructions (Addendum)
Medication Instructions:   Take lasix 40 mg twice a day  Labwork:  No new labs needed  Testing/Procedures:  No further testing at this time   Follow-Up: It was a pleasure seeing you in the office today. Please call us if you have new issues that need to be addressed before your next appt.  [phone]  Your physician wants you to follow-up in: 6 months.  You will receive a reminder letter in the mail two months in advance. If you don't receive a letter, please call our office to schedule the follow-up appointment.  If you need a refill on your cardiac medications before your next appointment, please call your pharmacy.  For educational health videos Log in to : [email or web address] Or : [email or web address], password : [email or web address]

## 2018-04-16 ENCOUNTER — Other Ambulatory Visit (HOSPITAL_COMMUNITY): Payer: Self-pay

## 2018-04-16 ENCOUNTER — Ambulatory Visit (HOSPITAL_COMMUNITY): Payer: Self-pay

## 2018-04-16 ENCOUNTER — Encounter: Payer: Self-pay | Admitting: Family

## 2018-04-16 ENCOUNTER — Ambulatory Visit: Payer: Medicare Other | Attending: Family | Admitting: Family

## 2018-04-16 VITALS — BP 136/58 | HR 51 | Resp 18 | Ht 66.0 in | Wt 245.4 lb

## 2018-04-16 DIAGNOSIS — Z7984 Long term (current) use of oral hypoglycemic drugs: Secondary | ICD-10-CM | POA: Diagnosis not present

## 2018-04-16 DIAGNOSIS — G4733 Obstructive sleep apnea (adult) (pediatric): Secondary | ICD-10-CM | POA: Insufficient documentation

## 2018-04-16 DIAGNOSIS — G43909 Migraine, unspecified, not intractable, without status migrainosus: Secondary | ICD-10-CM | POA: Diagnosis not present

## 2018-04-16 DIAGNOSIS — E119 Type 2 diabetes mellitus without complications: Secondary | ICD-10-CM | POA: Insufficient documentation

## 2018-04-16 DIAGNOSIS — I890 Lymphedema, not elsewhere classified: Secondary | ICD-10-CM | POA: Diagnosis not present

## 2018-04-16 DIAGNOSIS — Z882 Allergy status to sulfonamides status: Secondary | ICD-10-CM | POA: Insufficient documentation

## 2018-04-16 DIAGNOSIS — Z8249 Family history of ischemic heart disease and other diseases of the circulatory system: Secondary | ICD-10-CM | POA: Insufficient documentation

## 2018-04-16 DIAGNOSIS — I10 Essential (primary) hypertension: Secondary | ICD-10-CM

## 2018-04-16 DIAGNOSIS — J449 Chronic obstructive pulmonary disease, unspecified: Secondary | ICD-10-CM | POA: Diagnosis not present

## 2018-04-16 DIAGNOSIS — I110 Hypertensive heart disease with heart failure: Secondary | ICD-10-CM | POA: Insufficient documentation

## 2018-04-16 DIAGNOSIS — Z7982 Long term (current) use of aspirin: Secondary | ICD-10-CM | POA: Insufficient documentation

## 2018-04-16 DIAGNOSIS — I509 Heart failure, unspecified: Secondary | ICD-10-CM | POA: Insufficient documentation

## 2018-04-16 DIAGNOSIS — Z79899 Other long term (current) drug therapy: Secondary | ICD-10-CM | POA: Diagnosis not present

## 2018-04-16 DIAGNOSIS — Z9049 Acquired absence of other specified parts of digestive tract: Secondary | ICD-10-CM | POA: Diagnosis not present

## 2018-04-16 DIAGNOSIS — I5032 Chronic diastolic (congestive) heart failure: Secondary | ICD-10-CM

## 2018-04-16 DIAGNOSIS — R0602 Shortness of breath: Secondary | ICD-10-CM | POA: Diagnosis present

## 2018-04-16 LAB — GLUCOSE, CAPILLARY: GLUCOSE-CAPILLARY: 85 mg/dL (ref 70–99)

## 2018-04-16 MED ORDER — FUROSEMIDE 40 MG PO TABS: 80.0000 mg | ORAL_TABLET | Freq: Every day | ORAL | 5 refills | Status: DC

## 2018-04-16 MED ORDER — POTASSIUM CHLORIDE CRYS ER 20 MEQ PO TBCR: 20.0000 meq | EXTENDED_RELEASE_TABLET | Freq: Every day | ORAL | 5 refills | Status: DC

## 2018-04-16 MED ORDER — SIMVASTATIN 40 MG PO TABS: 40.0000 mg | ORAL_TABLET | Freq: Every day | ORAL | 5 refills | Status: DC

## 2018-04-16 MED ORDER — LISINOPRIL 10 MG PO TABS: 10.0000 mg | ORAL_TABLET | Freq: Every day | ORAL | 5 refills | Status: DC

## 2018-04-16 NOTE — Progress Notes (Signed)
Patient ID: [name], male    DOB: 07-10-1951, 67 y.o.   MRN: [id number]  HPI  [name] is a 67 y/o male with a history of DM, HTN, COPD, migraines, obstructive sleep apnea and chronic heart failure.  Echo report from 03/20/18 reviewed and showed an EF of 60-65% along with mild/moderate Drew and a mildly elevated PA pressure of 42 mm Hg.   Admitted 03/29/18 due to acute pulmonary edema. Cardiology consult obtained. Initially needed IV lasix and then transitioned to oral diuretics. Net negative output of 3.3L. Discharged after 2 days. Admitted 03/19/18 due to chest pain. Cardiology consult obtained. Cardiac enzymes and stress test were negative. Medications adjusted and he was discharged after 2 days.   He presents today for his initial visit with a chief complaint of moderate shortness of breath upon minimal exertion. He says this has been present for several months. He has associated fatigue, head congestion, cough, intermittent chest pain, pedal edema, abdominal distention, light-headedness, headaches and difficulty sleeping along with this. He denies any palpitations or noticeable weight gain. He currently is living with a friend but does not have scales. Due to his criminal record, there aren't many places that he is allowed to stay in (ex: homeless shelter)  Past Medical History:  Diagnosis Date  . Asthma   . CHF (congestive heart failure) ([place])   . COPD (chronic obstructive pulmonary disease) ([place])   . Diabetes mellitus without complication ([place])   . Homelessness   . Hypertension   . Migraine   . Obesity   . Sleep apnea    Past Surgical History:  Procedure Laterality Date  . CARDIAC CATHETERIZATION    . CHOLECYSTECTOMY    . EYE SURGERY    . INNER EAR SURGERY    . NOSE SURGERY     Family History  Problem Relation Age of Onset  . Hypertension Mother   . Heart disease Mother   . Heart failure Maternal Grandmother    Social History   Tobacco Use  . Smoking status: Never  Smoker  . Smokeless tobacco: Never Used  Substance Use Topics  . Alcohol use: Never    Frequency: Never   Allergies  Allergen Reactions  . Eggs Or Egg-Derived Products Nausea And Vomiting  . Novocain [Procaine]   . Sulfa Antibiotics    Prior to Admission medications   Medication Sig Start Date End Date Taking? Authorizing Provider  acetaminophen (TYLENOL) 650 MG CR tablet Take 1 tablet (650 mg total) by mouth 2 (two) times daily as needed for pain. Patient taking differently: Take 1,300 mg by mouth daily.  03/31/18  Yes Gouru, Illene Silver, MD  albuterol (PROVENTIL HFA;VENTOLIN HFA) 108 (90 Base) MCG/ACT inhaler Inhale 1-2 puffs into the lungs every 6 (six) hours as needed for wheezing or shortness of breath. 03/31/18  Yes Gouru, Illene Silver, MD  aspirin 81 MG chewable tablet Chew 1 tablet (81 mg total) by mouth daily. 03/21/18  Yes Demetrios Loll, MD  Dextrose, Diabetic Use, (DEXTROSE PO) Take by mouth.   Yes [provider]  fluticasone (FLONASE) 50 MCG/ACT nasal spray Place 2 sprays into both nostrils 2 (two) times daily.   Yes [provider]  furosemide (LASIX) 40 MG tablet Take 1 tablet (40 mg total) by mouth 2 (two) times daily. Patient taking differently: Take 40 mg by mouth 2 (two) times daily. Taking 80mg  alternating with 40mg  every other day 04/07/18  Yes Gollan, Kathlene November, MD  HYDROcodone-acetaminophen (NORCO/VICODIN) 5-325 MG tablet  Take 1-2 tablets by mouth 3 (three) times daily as needed for up to 10 doses for moderate pain or severe pain. 03/31/18  Yes Gouru, Illene Silver, MD  lisinopril (PRINIVIL,ZESTRIL) 10 MG tablet Take 1 tablet (10 mg total) by mouth daily. 03/31/18  Yes Gouru, Illene Silver, MD  loperamide (LOPERAMIDE A-D) 2 MG tablet Take 4 mg by mouth daily.   Yes [provider]  meclizine (ANTIVERT) 25 MG tablet Take 25 mg by mouth daily.   Yes [provider]  metFORMIN (GLUCOPHAGE-XR) 500 MG 24 hr tablet Take 2 tablets (1,000 mg total) by mouth 2 (two) times daily.  03/31/18  Yes Gouru, Illene Silver, MD  omeprazole (PRILOSEC) 40 MG capsule Take 1 capsule (40 mg total) by mouth AC breakfast. 03/31/18  Yes Gouru, Aruna, MD  potassium chloride (K-DUR,KLOR-CON) 20 MEQ tablet Take 1 tablet (20 mEq total) by mouth daily. 04/07/18  Yes Gollan, Kathlene November, MD  promethazine (PHENERGAN) 25 MG tablet Take 25 mg by mouth every 6 (six) hours as needed for nausea or vomiting.   Yes [provider]  simvastatin (ZOCOR) 40 MG tablet Take 1 tablet (40 mg total) by mouth daily at 6 PM. 04/07/18  Yes Gollan, Kathlene November, MD  tamsulosin (FLOMAX) 0.4 MG CAPS capsule Take 0.4 mg by mouth daily.   Yes [provider]     Review of Systems  Constitutional: Positive for fatigue. Negative for appetite change.  HENT: Positive for congestion. Negative for postnasal drip and sore throat.   Eyes: Negative.   Respiratory: Positive for cough and shortness of breath (with minimal exertion).   Cardiovascular: Positive for chest pain (with exertion; resolves with rest) and leg swelling. Negative for palpitations.  Gastrointestinal: Positive for abdominal distention. Negative for abdominal pain.  Endocrine: Negative.   Genitourinary: Negative.   Musculoskeletal: Positive for back pain and neck pain.  Skin: Negative.   Allergic/Immunologic: Negative.   Neurological: Positive for light-headedness and headaches (migraines couple days of week). Negative for dizziness.  Hematological: Negative for adenopathy. Does not bruise/bleed easily.  Psychiatric/Behavioral: Positive for sleep disturbance (not sleeping well). Negative for dysphoric mood. The patient is not nervous/anxious.     Vitals:   04/16/18 1028  BP: (!) 136/58  Pulse: (!) 51  Resp: 18  SpO2: 99%  Weight: 245 lb 6 oz (111.3 kg)  Height: 5\' 6"  (1.676 m)   Wt Readings from Last 3 Encounters:  04/16/18 245 lb 6 oz (111.3 kg)  04/07/18 254 lb (115.2 kg)  03/31/18 250 lb 11.2 oz (113.7 kg)   Lab Results  Component Value  Date   CREATININE 1.08 03/31/2018   CREATININE 1.30 (H) 03/30/2018   CREATININE 1.13 03/28/2018   Physical Exam  Constitutional: He is oriented to person, place, and time. He appears well-developed and well-nourished.  HENT:  Head: Normocephalic and atraumatic.  Neck: Normal range of motion. Neck supple. No JVD present.  Cardiovascular: Regular rhythm. Bradycardia present.  Pulmonary/Chest: Effort normal. No respiratory distress. He has no wheezes. He has no rales.  Abdominal: Soft. He exhibits no distension.  Musculoskeletal: He exhibits edema (2+ pitting edema bilateral lower legs). He exhibits no tenderness.  Neurological: He is alert and oriented to person, place, and time.  Skin: Skin is warm and dry.  Psychiatric: He has a normal mood and affect. His behavior is normal.  Nursing note and vitals reviewed.   Assessment & Plan:  1: Chronic heart failure with preserved ejection fraction- - NYHA class III - mildly fluid  overloaded today - not weighing daily as he doesn't have scales. Set of scales given to patient and he was instructed to weigh every morning after using the bathroom, write the weight down and call for an overnight weight gain of >2 pounds or a weekly weight gain of >5 pounds - not adding salt to his food and is currently living with a friend so is cooking his food without salt. When he doesn't have a place to live, he tends to eat saltier foods - discussed the importance of closely following a 2000mg  sodium diet and written information was given to him about this - does walk ~ 3 miles daily - advised patient to increase his furosemide to 80mg  daily and potassium to 55meq daily - will check a BMP at his next visit - saw cardiology [name]) 04/07/18 - BNP 03/28/18 was 132.0 - PharmD reconciled medications with the patient - paramedic was present and talked with patient who agree to have paramedic come visit him next week  2: HTN- - BP looks good today - has been to  [facility] in the past but her insurance card says Rutherford Guys, MD ([place]). [facility] was called and appointment was made for patient - BMP 03/31/18 reviewed and showed sodium 141, potassium 3.9 and GFR >60  3: DM- - nonfasting glucose in clinic was 85 - A1c 03/20/18 was 5.5%  4: Lymphedema- - stage 2 - does walk for transportation/ uses [employer] for longer trips - encouraged him to wear compression socks daily with removal at bedtime - also encouraged him to elevate his legs  - could consider lymphapress compression pumps if his housing situation stabilizes  Medication bottles were reviewed.  Return in 2 weeks or sooner for any questions/problems before then.

## 2018-04-16 NOTE — Patient Instructions (Addendum)
Begin weighing daily and call for an overnight weight gain of > 2 pounds or a weekly weight gain of >5 pounds.  Increase furosemide (lasix) to 2 tablets daily  Go to [facility] this Saturday, August 24th at 8:40am.

## 2018-04-16 NOTE — Progress Notes (Deleted)
This encounter was entered by mistake.

## 2018-04-17 ENCOUNTER — Encounter: Payer: Self-pay | Admitting: Family

## 2018-04-17 DIAGNOSIS — I890 Lymphedema, not elsewhere classified: Secondary | ICD-10-CM | POA: Insufficient documentation

## 2018-04-17 DIAGNOSIS — I10 Essential (primary) hypertension: Secondary | ICD-10-CM | POA: Insufficient documentation

## 2018-04-17 DIAGNOSIS — E1165 Type 2 diabetes mellitus with hyperglycemia: Secondary | ICD-10-CM | POA: Insufficient documentation

## 2018-04-17 DIAGNOSIS — E119 Type 2 diabetes mellitus without complications: Secondary | ICD-10-CM | POA: Insufficient documentation

## 2018-04-17 NOTE — Progress Notes (Signed)
Met with [name] at [facility] appt and discussed the program.  He agreed to the program and we discussed his concerns.  He wants a med box to organize his meds.  [name] gave him a scale and advised to start weighing and document every morning.  We discussed where to meet and made appt for next week to see him at his home, he states his girlfriend will be home during the day.  Discussed diet, he states his diet is bad due to affordability of food, they have been depending on food pantries.  Will get him more resources that the county offers. He states he rides public transportation to and from appts. He does not want info on medicaid transportation.  He states he has meds for remaining of month.  He will need refills, [name] has made appt for him to be followed at [facility].  Will visit him weekly at this time.   [name] [phone]

## 2018-04-18 ENCOUNTER — Encounter: Payer: Self-pay | Admitting: Emergency Medicine

## 2018-04-18 ENCOUNTER — Other Ambulatory Visit: Payer: Self-pay

## 2018-04-18 ENCOUNTER — Emergency Department
Admission: EM | Admit: 2018-04-18 | Discharge: 2018-04-18 | Disposition: A | Discharge status: Home / Self Care | Payer: Medicare Other | Attending: Emergency Medicine | Admitting: Emergency Medicine

## 2018-04-18 ENCOUNTER — Emergency Department: Payer: Medicare Other

## 2018-04-18 DIAGNOSIS — Z7984 Long term (current) use of oral hypoglycemic drugs: Secondary | ICD-10-CM | POA: Insufficient documentation

## 2018-04-18 DIAGNOSIS — M545 Low back pain, unspecified: Secondary | ICD-10-CM

## 2018-04-18 DIAGNOSIS — I110 Hypertensive heart disease with heart failure: Secondary | ICD-10-CM | POA: Insufficient documentation

## 2018-04-18 DIAGNOSIS — Z7982 Long term (current) use of aspirin: Secondary | ICD-10-CM | POA: Insufficient documentation

## 2018-04-18 DIAGNOSIS — W010XXA Fall on same level from slipping, tripping and stumbling without subsequent striking against object, initial encounter: Secondary | ICD-10-CM | POA: Diagnosis not present

## 2018-04-18 DIAGNOSIS — Y92480 Sidewalk as the place of occurrence of the external cause: Secondary | ICD-10-CM | POA: Diagnosis not present

## 2018-04-18 DIAGNOSIS — Z79899 Other long term (current) drug therapy: Secondary | ICD-10-CM | POA: Diagnosis not present

## 2018-04-18 DIAGNOSIS — I5032 Chronic diastolic (congestive) heart failure: Secondary | ICD-10-CM | POA: Insufficient documentation

## 2018-04-18 DIAGNOSIS — J449 Chronic obstructive pulmonary disease, unspecified: Secondary | ICD-10-CM | POA: Diagnosis not present

## 2018-04-18 DIAGNOSIS — Y9301 Activity, walking, marching and hiking: Secondary | ICD-10-CM | POA: Diagnosis not present

## 2018-04-18 DIAGNOSIS — Y998 Other external cause status: Secondary | ICD-10-CM | POA: Diagnosis not present

## 2018-04-18 DIAGNOSIS — S90822A Blister (nonthermal), left foot, initial encounter: Secondary | ICD-10-CM | POA: Diagnosis not present

## 2018-04-18 DIAGNOSIS — W19XXXA Unspecified fall, initial encounter: Secondary | ICD-10-CM

## 2018-04-18 DIAGNOSIS — E119 Type 2 diabetes mellitus without complications: Secondary | ICD-10-CM | POA: Diagnosis not present

## 2018-04-18 MED ORDER — CYCLOBENZAPRINE HCL 10 MG PO TABS
10.0000 mg | ORAL_TABLET | Freq: Once | ORAL | Status: AC
  Administered 2018-04-18: 10 mg via ORAL
  Filled 2018-04-18: qty 1

## 2018-04-18 MED ORDER — CEPHALEXIN 500 MG PO CAPS: 500.0000 mg | ORAL_CAPSULE | Freq: Three times a day (TID) | ORAL | 0 refills | Status: DC

## 2018-04-18 MED ORDER — CYCLOBENZAPRINE HCL 5 MG PO TABS: 5.0000 mg | ORAL_TABLET | Freq: Three times a day (TID) | ORAL | 0 refills | Status: DC | PRN

## 2018-04-18 MED ORDER — CEPHALEXIN 500 MG PO CAPS
500.0000 mg | ORAL_CAPSULE | Freq: Once | ORAL | Status: AC
  Administered 2018-04-18: 500 mg via ORAL
  Filled 2018-04-18: qty 1

## 2018-04-18 MED ORDER — ACETAMINOPHEN 500 MG PO TABS
ORAL_TABLET | ORAL | Status: AC
  Filled 2018-04-18: qty 2

## 2018-04-18 MED ORDER — ACETAMINOPHEN 500 MG PO TABS
1000.0000 mg | ORAL_TABLET | Freq: Once | ORAL | Status: AC
  Administered 2018-04-18: 1000 mg via ORAL

## 2018-04-18 NOTE — ED Notes (Signed)
Pt verbalizes understanding of discharge instructions.

## 2018-04-18 NOTE — Discharge Instructions (Signed)
Your exam and x-rays do not reveal any acute fractures or dislocations related to your fall. Take your home meds, along with the muscle relaxant, as needed for pain. Follow-up with your provider for ongoing symptoms.

## 2018-04-18 NOTE — ED Triage Notes (Signed)
Brought in via ems s/p fall  States he tripped and slid off curb    Having pain to right mid back and lower back   States he was able to get up and walk several steps   Then states right leg went numb

## 2018-04-19 NOTE — ED Provider Notes (Signed)
[facility] Emergency Department Provider Note ____________________________________________  Time seen: 2045  I have reviewed the triage vital signs and the nursing notes.  HISTORY  Chief Complaint  Fall  HPI [name] is a 67 y.o. male presents to the ED vial EMSfor evaluation of injury following a mechanical fall. The patient describes falling as he stepped up on a curb, his foot slipped and he fell landing on his right side.  He complains of pain to the right mid and lower back.  He was able to get up and walk for several steps after the fall, but then notes that his right leg began to feel numb.  He also notes a blister to the bottom of his left foot.  He wonders if that is what caused him to fall, as the blisters been increasingly tender and painful.  He denies any head injury, loss of consciousness, shortness of breath, or weakness.  He also denies any bladder or bowel incontinence.  Past Medical History:  Diagnosis Date  . Asthma   . CHF (congestive heart failure) ([place])   . COPD (chronic obstructive pulmonary disease) ([place])   . Diabetes mellitus without complication ([place])   . Homelessness   . Hypertension   . Migraine   . Obesity   . Sleep apnea     Patient Active Problem List   Diagnosis Date Noted  . HTN (hypertension) 04/17/2018  . DM (diabetes mellitus) ([place]) 04/17/2018  . Lymphedema 04/17/2018  . Chronic diastolic CHF (congestive heart failure) ([place]) 04/07/2018  . Anemia 04/07/2018  . Bilateral leg pain 03/29/2018  . Chest pain 03/20/2018  . Adjustment disorder with mixed anxiety and depressed mood 12/16/2017  . Homelessness 12/16/2017    Past Surgical History:  Procedure Laterality Date  . CARDIAC CATHETERIZATION    . CHOLECYSTECTOMY    . EYE SURGERY    . INNER EAR SURGERY    . NOSE SURGERY      Prior to Admission medications   Medication Sig Start Date End Date Taking? Authorizing Provider  acetaminophen (TYLENOL) 650 MG CR  tablet Take 1 tablet (650 mg total) by mouth 2 (two) times daily as needed for pain. Patient taking differently: Take 1,300 mg by mouth daily.  03/31/18   Gouru, Illene Silver, MD  albuterol (PROVENTIL HFA;VENTOLIN HFA) 108 (90 Base) MCG/ACT inhaler Inhale 1-2 puffs into the lungs every 6 (six) hours as needed for wheezing or shortness of breath. 03/31/18   Nicholes Mango, MD  aspirin 81 MG chewable tablet Chew 1 tablet (81 mg total) by mouth daily. 03/21/18   Demetrios Loll, MD  cephALEXin (KEFLEX) 500 MG capsule Take 1 capsule (500 mg total) by mouth 3 (three) times daily. 04/18/18   Rexton Greulich, Dannielle Karvonen, PA-C  cyclobenzaprine (FLEXERIL) 5 MG tablet Take 1 tablet (5 mg total) by mouth 3 (three) times daily as needed for muscle spasms. 04/18/18   Sylver Vantassell, Dannielle Karvonen, PA-C  Dextrose, Diabetic Use, (DEXTROSE PO) Take by mouth.    [provider]  fluticasone (FLONASE) 50 MCG/ACT nasal spray Place 2 sprays into both nostrils 2 (two) times daily.    [provider]  furosemide (LASIX) 40 MG tablet Take 2 tablets (80 mg total) by mouth daily. 04/16/18   Alisa Graff, FNP  HYDROcodone-acetaminophen (NORCO/VICODIN) 5-325 MG tablet Take 1-2 tablets by mouth 3 (three) times daily as needed for up to 10 doses for moderate pain or severe pain. 03/31/18   Nicholes Mango, MD  lisinopril (PRINIVIL,ZESTRIL) 10 MG tablet Take 1 tablet (10 mg total) by mouth daily. 04/16/18   Alisa Graff, FNP  loperamide (LOPERAMIDE A-D) 2 MG tablet Take 4 mg by mouth daily.    [provider]  meclizine (ANTIVERT) 25 MG tablet Take 25 mg by mouth daily.    [provider]  metFORMIN (GLUCOPHAGE-XR) 500 MG 24 hr tablet Take 2 tablets (1,000 mg total) by mouth 2 (two) times daily. 03/31/18   Nicholes Mango, MD  omeprazole (PRILOSEC) 40 MG capsule Take 1 capsule (40 mg total) by mouth AC breakfast. 03/31/18   Gouru, Illene Silver, MD  potassium chloride SA (K-DUR,KLOR-CON) 20 MEQ tablet Take 1 tablet (20 mEq total) by mouth  daily. 04/16/18   Alisa Graff, FNP  promethazine (PHENERGAN) 25 MG tablet Take 25 mg by mouth every 6 (six) hours as needed for nausea or vomiting.    [provider]  simvastatin (ZOCOR) 40 MG tablet Take 1 tablet (40 mg total) by mouth daily at 6 PM. 04/16/18   Alisa Graff, FNP  tamsulosin (FLOMAX) 0.4 MG CAPS capsule Take 0.4 mg by mouth daily.    [provider]    Allergies Eggs or egg-derived products; Novocain [procaine]; and Sulfa antibiotics  Family History  Problem Relation Age of Onset  . Hypertension Mother   . Heart disease Mother   . Heart failure Maternal Grandmother     Social History Social History   Tobacco Use  . Smoking status: Never Smoker  . Smokeless tobacco: Never Used  Substance Use Topics  . Alcohol use: Never    Frequency: Never  . Drug use: Never    Review of Systems  Constitutional: Negative for fever. Eyes: Negative for visual changes. ENT: Negative for sore throat. Cardiovascular: Negative for chest pain. Respiratory: Negative for shortness of breath. Gastrointestinal: Negative for abdominal pain, vomiting and diarrhea. Genitourinary: Negative for dysuria. Musculoskeletal: Positive for neck, upper & lower back pain. Skin: Negative for rash. Left foot blister Neurological: Negative for headaches, focal weakness or numbness. ____________________________________________  PHYSICAL EXAM:  VITAL SIGNS: ED Triage Vitals  Enc Vitals Group     BP 04/18/18 1825 (!) 127/51     Pulse Rate 04/18/18 1825 (!) 53     Resp 04/18/18 1825 18     Temp 04/18/18 1825 98.6 F (37 C)     Temp Source 04/18/18 1825 Oral     SpO2 04/18/18 1825 97 %     Weight 04/18/18 1825 250 lb (113.4 kg)     Height 04/18/18 1825 5\' 6"  (1.676 m)     Head Circumference --      Peak Flow --      Pain Score 04/18/18 1829 10     Pain Loc --      Pain Edu? --      Excl. in [place]? --     Constitutional: Alert and oriented. Well appearing and in no  distress. Head: Normocephalic and atraumatic. Eyes: Conjunctivae are normal. Normal extraocular movements Mouth/Throat: Mucous membranes are moist. Neck: Supple. Normal ROM without crepitus or midline tenderness Cardiovascular: Normal rate, regular rhythm. Normal distal pulses. Respiratory: Normal respiratory effort. No wheezes/rales/rhonchi. Gastrointestinal: Soft and nontender. No distention. Musculoskeletal: Normal spinal alignment without midline tenderness, spasm,or deformity. Mild tenderness to palp over the right dorsolateral thorax. Nontender with normal range of motion in all extremities.  Neurologic:  CN II-XII grossly intact. Normal LE DTRs bilaterally. Negative supine SLR bilaterally. Normal gross sensation. Normal speech  and language. No gross focal neurologic deficits are appreciated. Skin:  Skin is warm, dry and intact. No rash noted. Left plantar foot with a tender, small, intact blister with serous fluid noted over the 4th MTP.  Psychiatric: Mood and affect are normal. Patient exhibits appropriate insight and judgment. ____________________________________________   RADIOLOGY  Cervical Spine  IMPRESSION: 1. No cervical spine fracture. 2. Moderate multilevel degenerative changes in the cervical spine. 3. Minimal 2 mm retrolisthesis at C3-4.  Thoracic Spine  IMPRESSION: No thoracic spine fracture or subluxation. Mild degenerative disc disease in the mid to lower thoracic spine.  Lumbar Spine  IMPRESSION: 1. No acute abnormality. 2. Degenerative changes are most severe at L5-S1 and L4-5.  ____________________________________________  PROCEDURES  Tylenol 1000 mg PO Flexeril 10 mg PO Keflex 500 mg PO  .Marland KitchenIncision and Drainage Date/Time: 04/21/2018 7:17 PM Performed by: Melvenia Needles, PA-C Authorized by: Melvenia Needles, PA-C   Consent:    Consent obtained:  Verbal   Consent given by:  Patient   Risks discussed:  Infection   Alternatives  discussed:  No treatment Location:    Indications for incision and drainage: blister.   Size:  2   Location:  Lower extremity   Lower extremity location:  Foot   Foot location:  L foot Pre-procedure details:    Skin preparation:  Chloraprep Anesthesia (see MAR for exact dosages):    Anesthesia method:  None Procedure type:    Complexity:  Simple Procedure details:    Needle aspiration: no     Incision types:  Single straight   Incision depth:  Dermal   Scalpel blade:  11   Drainage:  Serous   Drainage amount:  Scant   Wound treatment:  Wound left open   Packing materials:  None Post-procedure details:    Patient tolerance of procedure:  Tolerated well, no immediate complications  ____________________________________________  INITIAL IMPRESSION / ASSESSMENT AND PLAN / ED COURSE  Patient with ED evaluation of injury sustained following mechanical fall.  Patient's exam is consistent with right-sided low back pain and sciatica.  His exam is without any acute neuromuscular deficit.  Patient transitions and ambulates without difficulty.  His symptoms are improved at the time of discharge.  He is discharged with a prescription for cyclobenzaprine for muscle pain and spasm relief.  Is also given Keflex to prophylax against any superficial injury to the foot blister.  He will follow-up with his primary provider for ongoing symptom management 2.  Return precautions have been reviewed. ____________________________________________  FINAL CLINICAL IMPRESSION(S) / ED DIAGNOSES  Final diagnoses:  Fall, initial encounter  Acute right-sided low back pain without sciatica  Blister (nonthermal), left foot, initial encounter      Melvenia Needles, PA-C 04/21/18 1920    Hinda Kehr, MD 04/23/18 1344

## 2018-04-22 ENCOUNTER — Other Ambulatory Visit (HOSPITAL_COMMUNITY): Payer: Self-pay

## 2018-04-23 NOTE — Progress Notes (Signed)
Bp: 108/70, pulse: 87, resp: 18, spo2: 97, 240 lbs  First visit with him today at residence.  He states has no furniture, sitting on floor watching tv.  We met on porch and did exam.  Lungs clear. Denies difficulty breathing. Swelling in lower extremities plus 2 edema, he states is better than normal.  He does not watch his diet due to cost.  He states does not have much food, will contact a church for some food delivery.  He states he has no transportation, he rides a bus and has to walk rest of way.  He is out of his meds, he has 7 meds to pick up.  Went and picked up his meds for him at [facility].  Explained to him that he will have to next month. He has all meds now and he states he will be able to afford them next month.  He is looking for a more permanent resident, he is just staying here temporarily.  He did give me new cell phone number: [phone].  He is wanting me to come visit and explained the program to him.  Advised him if he moves to let me know.  Will visit with him weekly at this time.   Opp [phone]

## 2018-04-29 NOTE — Progress Notes (Signed)
Patient ID: [name], male    DOB: 1951-03-12, 67 y.o.   MRN: [id number]  HPI  Mr Bord is a 67 y/o male with a history of DM, HTN, COPD, migraines, obstructive sleep apnea and chronic heart failure.  Echo report from 03/20/18 reviewed and showed an EF of 60-65% along with mild/moderate MR and a mildly elevated PA pressure of 42 mm Hg.   Was in the ED 04/18/18 due to a mechanical fall. Neck and back xrays were negative. Antibiotic was given for foot blister and he was released. Admitted 03/29/18 due to acute pulmonary edema. Cardiology consult obtained. Initially needed IV lasix and then transitioned to oral diuretics. Net negative output of 3.3L. Discharged after 2 days. Admitted 03/19/18 due to chest pain. Cardiology consult obtained. Cardiac enzymes and stress test were negative. Medications adjusted and he was discharged after 2 days.   He presents today for a follow-up visit with a chief complaint of moderate fatigue upon minimal exertion. He says that this has been chronic in nature having been present for many years. He has associated cough, shortness of breath (although improving), pedal edema, light-headedness, headaches and difficulty sleeping along with this. He denies any abdominal distention, palpitations, chest pain or weight gain. He currently is living with a friend but they may be getting evicted soon. Due to his criminal record, there aren't many places that he is allowed to stay in (ex: homeless shelter)  Past Medical History:  Diagnosis Date  . Asthma   . CHF (congestive heart failure) ([place])   . COPD (chronic obstructive pulmonary disease) ([place])   . Diabetes mellitus without complication ([place])   . Homelessness   . Hypertension   . Migraine   . Obesity   . Sleep apnea    Past Surgical History:  Procedure Laterality Date  . CARDIAC CATHETERIZATION    . CHOLECYSTECTOMY    . EYE SURGERY    . INNER EAR SURGERY    . NOSE SURGERY     Family History  Problem Relation  Age of Onset  . Hypertension Mother   . Heart disease Mother   . Heart failure Maternal Grandmother    Social History   Tobacco Use  . Smoking status: Never Smoker  . Smokeless tobacco: Never Used  Substance Use Topics  . Alcohol use: Never    Frequency: Never   Allergies  Allergen Reactions  . Eggs Or Egg-Derived Products Nausea And Vomiting  . Novocain [Procaine]   . Sulfa Antibiotics    Prior to Admission medications   Medication Sig Start Date End Date Taking? Authorizing Provider  acetaminophen (TYLENOL) 650 MG CR tablet Take 1 tablet (650 mg total) by mouth 2 (two) times daily as needed for pain. Patient taking differently: Take 1,300 mg by mouth daily.  03/31/18  Yes Gouru, Illene Silver, MD  albuterol (PROVENTIL HFA;VENTOLIN HFA) 108 (90 Base) MCG/ACT inhaler Inhale 1-2 puffs into the lungs every 6 (six) hours as needed for wheezing or shortness of breath. 03/31/18  Yes Gouru, Illene Silver, MD  aspirin EC 81 MG tablet Take 81 mg by mouth daily.   Yes [provider]  cephALEXin (KEFLEX) 500 MG capsule Take 1 capsule (500 mg total) by mouth 3 (three) times daily. 04/18/18  Yes Menshew, Dannielle Karvonen, PA-C  cyclobenzaprine (FLEXERIL) 5 MG tablet Take 1 tablet (5 mg total) by mouth 3 (three) times daily as needed for muscle spasms. 04/18/18  Yes Menshew, Dannielle Karvonen, PA-C  Dextromethorphan-guaiFENesin [facility]  COUGH/CHEST DM MAX) 10-200 MG/5ML LIQD Take 10 mLs by mouth every 4 (four) hours as needed.   Yes [provider]  Dextrose, Diabetic Use, (DEXTROSE PO) Take by mouth.   Yes [provider]  diphenhydrAMINE (BENADRYL) 25 MG tablet Take 50 mg by mouth daily.   Yes [provider]  fluticasone (FLONASE) 50 MCG/ACT nasal spray Place 2 sprays into both nostrils 2 (two) times daily.   Yes [provider]  furosemide (LASIX) 40 MG tablet Take 2 tablets (80 mg total) by mouth daily. Patient taking differently: Take 40 mg by mouth 2 (two) times  daily.  04/16/18  Yes [place] Rosamond, [name] A, FNP  HYDROcodone-acetaminophen (NORCO/VICODIN) 5-325 MG tablet Take 1-2 tablets by mouth 3 (three) times daily as needed for up to 10 doses for moderate pain or severe pain. 03/31/18  Yes Gouru, Illene Silver, MD  lisinopril (PRINIVIL,ZESTRIL) 10 MG tablet Take 1 tablet (10 mg total) by mouth daily. 04/16/18  Yes Sian Joles, [name] A, FNP  loperamide (LOPERAMIDE A-D) 2 MG tablet Take 4 mg by mouth as needed.    Yes [provider]  meclizine (ANTIVERT) 25 MG tablet Take 25 mg by mouth every 6 (six) hours as needed for dizziness or nausea.    Yes [provider]  metFORMIN (GLUCOPHAGE-XR) 500 MG 24 hr tablet Take 2 tablets (1,000 mg total) by mouth 2 (two) times daily. 03/31/18  Yes Gouru, Illene Silver, MD  Multiple Vitamin (MULTIVITAMIN WITH MINERALS) TABS tablet Take 1 tablet by mouth daily.   Yes [provider]  omeprazole (PRILOSEC) 40 MG capsule Take 1 capsule (40 mg total) by mouth AC breakfast. 03/31/18  Yes Gouru, Aruna, MD  potassium chloride SA (K-DUR,KLOR-CON) 20 MEQ tablet Take 1 tablet (20 mEq total) by mouth daily. 04/16/18  Yes Sammy Cassar, [name] A, FNP  promethazine (PHENERGAN) 25 MG tablet Take 25 mg by mouth every 6 (six) hours as needed for nausea or vomiting.   Yes [provider]  simvastatin (ZOCOR) 40 MG tablet Take 1 tablet (40 mg total) by mouth daily at 6 PM. 04/16/18  Yes Darylene Price A, FNP  tamsulosin (FLOMAX) 0.4 MG CAPS capsule Take 0.4 mg by mouth daily.   Yes [provider]   Review of Systems  Constitutional: Positive for fatigue. Negative for appetite change.  HENT: Positive for congestion. Negative for postnasal drip and sore throat.   Eyes: Negative.   Respiratory: Positive for cough and shortness of breath (improving).   Cardiovascular: Positive for leg swelling. Negative for chest pain and palpitations.  Gastrointestinal: Positive for abdominal distention. Negative for abdominal pain.  Endocrine: Negative.    Genitourinary: Negative.   Musculoskeletal: Positive for back pain and neck pain.  Skin: Negative.   Allergic/Immunologic: Negative.   Neurological: Positive for light-headedness (with quick position changes) and headaches (migraines couple days of week). Negative for dizziness.  Hematological: Negative for adenopathy. Does not bruise/bleed easily.  Psychiatric/Behavioral: Positive for sleep disturbance (not sleeping well). Negative for dysphoric mood. The patient is not nervous/anxious.    Vitals:   04/30/18 1026  BP: (!) 141/56  Pulse: (!) 50  Resp: 18  SpO2: 99%  Weight: 237 lb 4 oz (107.6 kg)  Height: 5\' 6"  (1.676 m)   Wt Readings from Last 3 Encounters:  04/30/18 237 lb 4 oz (107.6 kg)  04/22/18 240 lb (108.9 kg)  04/18/18 250 lb (113.4 kg)   Lab Results  Component Value Date   CREATININE 1.08 03/31/2018   CREATININE 1.30 (  H) 03/30/2018   CREATININE 1.13 03/28/2018    Physical Exam  Constitutional: He is oriented to person, place, and time. He appears well-developed and well-nourished.  HENT:  Head: Normocephalic and atraumatic.  Neck: Normal range of motion. Neck supple. No JVD present.  Cardiovascular: An irregular rhythm present. Bradycardia present.  Pulmonary/Chest: Effort normal. No respiratory distress. He has no wheezes. He has no rales.  Abdominal: Soft. He exhibits no distension.  Musculoskeletal: He exhibits edema (2+ pitting edema bilateral lower legs). He exhibits no tenderness.  Neurological: He is alert and oriented to person, place, and time.  Skin: Skin is warm and dry.  Psychiatric: He has a normal mood and affect. His behavior is normal.  Nursing note and vitals reviewed.   Assessment & Plan:  1: Chronic heart failure with preserved ejection fraction- - NYHA class III - mildly fluid overloaded today but better from last visit - weighing daily; reminded to weigh every morning after using the bathroom, write the weight down and call for an  overnight weight gain of >2 pounds or a weekly weight gain of >5 pounds - weight down 8 pounds since he was last here 2 weeks ago - not adding salt to his food and is currently living with a friend so is cooking his food without salt. When he doesn't have a place to live, he tends to eat saltier foods - discussed the importance of closely following a 2000mg  sodium diet as much as he can - does walk ~ 3 miles daily - BMP checked 04/19/18 so will not repeat it today - saw cardiology [name]) 04/07/18 - BNP 03/28/18 was 132.0 - PharmD reconciled medications with the patient - participating in paramedicine program  2: HTN- - BP mildly elevated today - saw PCP at [facility] 04/19/18 & returns 05/06/18 - BMP 04/19/18 reviewed and showed sodium 143, potassium 3.5, creatinine 1.2 and GFR 62  3: DM- - glucose on 04/19/18 was 87 - A1c 03/20/18 was 5.5%  4: Lymphedema- - stage 2 - does walk for transportation/ uses [employer] for longer trips - encouraged him to wear compression socks daily with removal at bedtime - also encouraged him to elevate his legs  - could consider lymphapress compression pumps if his housing situation stabilizes  Medication bottles were reviewed.  Return in 2 months or sooner for any questions/problems before then.

## 2018-04-30 ENCOUNTER — Encounter: Payer: Self-pay | Admitting: Family

## 2018-04-30 ENCOUNTER — Ambulatory Visit: Payer: Medicare Other | Attending: Family | Admitting: Family

## 2018-04-30 VITALS — BP 141/56 | HR 50 | Resp 18 | Ht 66.0 in | Wt 237.2 lb

## 2018-04-30 DIAGNOSIS — I890 Lymphedema, not elsewhere classified: Secondary | ICD-10-CM | POA: Diagnosis not present

## 2018-04-30 DIAGNOSIS — Z7982 Long term (current) use of aspirin: Secondary | ICD-10-CM | POA: Insufficient documentation

## 2018-04-30 DIAGNOSIS — E119 Type 2 diabetes mellitus without complications: Secondary | ICD-10-CM | POA: Insufficient documentation

## 2018-04-30 DIAGNOSIS — J449 Chronic obstructive pulmonary disease, unspecified: Secondary | ICD-10-CM | POA: Insufficient documentation

## 2018-04-30 DIAGNOSIS — Z9889 Other specified postprocedural states: Secondary | ICD-10-CM | POA: Diagnosis not present

## 2018-04-30 DIAGNOSIS — I5032 Chronic diastolic (congestive) heart failure: Secondary | ICD-10-CM | POA: Insufficient documentation

## 2018-04-30 DIAGNOSIS — R5383 Other fatigue: Secondary | ICD-10-CM | POA: Diagnosis present

## 2018-04-30 DIAGNOSIS — Z7984 Long term (current) use of oral hypoglycemic drugs: Secondary | ICD-10-CM | POA: Insufficient documentation

## 2018-04-30 DIAGNOSIS — I10 Essential (primary) hypertension: Secondary | ICD-10-CM

## 2018-04-30 DIAGNOSIS — Z79899 Other long term (current) drug therapy: Secondary | ICD-10-CM | POA: Diagnosis not present

## 2018-04-30 DIAGNOSIS — Z882 Allergy status to sulfonamides status: Secondary | ICD-10-CM | POA: Insufficient documentation

## 2018-04-30 DIAGNOSIS — Z9049 Acquired absence of other specified parts of digestive tract: Secondary | ICD-10-CM | POA: Insufficient documentation

## 2018-04-30 DIAGNOSIS — I110 Hypertensive heart disease with heart failure: Secondary | ICD-10-CM | POA: Insufficient documentation

## 2018-04-30 NOTE — Patient Instructions (Addendum)
Continue weighing daily and call for an overnight weight gain of > 2 pounds or a weekly weight gain of >5 pounds.  Follow up with Princella Ion on Tuesday September 10th at 1 pm

## 2018-05-08 ENCOUNTER — Ambulatory Visit (INDEPENDENT_AMBULATORY_CARE_PROVIDER_SITE_OTHER): Payer: Medicare Other | Admitting: Gastroenterology

## 2018-05-08 ENCOUNTER — Encounter: Payer: Self-pay | Admitting: Gastroenterology

## 2018-05-08 ENCOUNTER — Telehealth (HOSPITAL_COMMUNITY): Payer: Self-pay

## 2018-05-08 ENCOUNTER — Encounter

## 2018-05-08 VITALS — BP 153/74 | HR 56 | Ht 66.0 in | Wt 234.8 lb

## 2018-05-08 DIAGNOSIS — B1920 Unspecified viral hepatitis C without hepatic coma: Secondary | ICD-10-CM

## 2018-05-08 NOTE — Telephone Encounter (Signed)
[name] contacted me and advised he has missed the bus to meet me and to go to his appt at hospital, but he has caught a later bus and asked if I would contact his doctors appt at GI at [facility] and advise them he may be late.  Advised GI doctors office at [facility] and they advised his appt is at 2:15, and she states they will see him.   [place] [phone]

## 2018-05-08 NOTE — Patient Instructions (Signed)
Hepatitis C Hepatitis C is a viral infection of the liver. It can lead to scarring of the liver (cirrhosis), liver failure, or liver cancer. Hepatitis C may go undetected for months or years because people with the infection may not have symptoms, or they may have only mild symptoms. What are the causes? This condition is caused by the hepatitis C virus (HCV). The virus can spread from person to person (is contagious) through:  Blood.  Childbirth. A woman who has hepatitis C can pass it to her baby during birth.  Bodily fluids, such as breast milk, tears, semen, vaginal fluids, and saliva.  Blood transfusions or organ transplants done in the [place] before 1992.  What increases the risk? The following factors may make you more likely to develop this condition:  Having contact with unclean (contaminated) needles or syringes. This may result from: ? Acupuncture. ? Tattoing. ? Body piercing. ? Injecting drugs.  Having unprotected sex with someone who is infected.  Needing treatment to filter your blood (kidney dialysis).  Having HIV (human immunodeficiency virus) or AIDS (acquired immunodeficiency syndrome).  Working in a job that involves contact with blood or bodily fluids, such as health care.  What are the signs or symptoms? Symptoms of this condition include:  Fatigue.  Loss of appetite.  Nausea.  Vomiting.  Abdominal pain.  Dark yellow urine.  Yellowish skin and eyes (jaundice).  Itchy skin.  Clay-colored bowel movements.  Joint pain.  Bleeding and bruising easily.  Fluid building up in your stomach (ascites).  In some cases, you may not have any symptoms. How is this diagnosed? This condition is diagnosed with:  Blood tests.  Other tests to check how well your liver is functioning. They may include: ? Magnetic resonance elastography (MRE). This imaging test uses MRIs and sound waves to measure liver stiffness. ? Transient elastography. This  imaging test uses ultrasounds to measure liver stiffness. ? Liver biopsy. This test requires taking a small tissue sample from your liver to examine it under a microscope.  How is this treated? Your health care provider may perform noninvasive tests or a liver biopsy to help decide the best course of treatment. Treatment may include:  Antiviral medicines and other medicines.  Follow-up treatments every 6-12 months for infections or other liver conditions.  Receiving a donated liver (liver transplant).  Follow these instructions at home: Medicines  Take over-the-counter and prescription medicines only as told by your health care provider.  Take your antiviral medicine as told by your health care provider. Do not stop taking the antiviral even if you start to feel better.  Do not take any medicines unless approved by your health care provider, including over-the-counter medicines and birth control pills. Activity  Rest as needed.  Do not have sex unless approved by your health care provider.  Ask your health care provider when you may return to school or work. Eating and drinking  Eat a balanced diet with plenty of fruits and vegetables, whole grains, and lowfat (lean) meats or non-meat proteins (such as beans or tofu).  Drink enough fluids to keep your urine clear or pale yellow.  Do not drink alcohol. General instructions  Do not share toothbrushes, nail clippers, or razors.  Wash your hands frequently with soap and water. If soap and water are not available, use hand sanitizer.  Cover any cuts or open sores on your skin to prevent spreading the virus.  Keep all follow-up visits as told by your health care   provider. This is important. You may need follow-up visits every 6-12 months. How is this prevented? There is no vaccine for hepatitis C. The only way to prevent the disease is to reduce the risk of exposure to the virus. Make sure you:  Wash your hands frequently with  soap and water. If soap and water are not available, use hand sanitizer.  Do not share needles or syringes.  Practice safe sex and use condoms.  Avoid handling blood or bodily fluids without gloves or other protection.  Avoid getting tattoos or piercings in shops or other locations that are not clean.  Contact a health care provider if:  You have a fever.  You develop abdominal pain.  You pass dark urine.  You pass clay-colored stools.  You develop joint pain. Get help right away if:  You have increasing fatigue or weakness.  You lose your appetite.  You cannot eat or drink without vomiting.  You develop jaundice or your jaundice gets worse.  You bruise or bleed easily. Summary  Hepatitis C is a viral infection of the liver. It can lead to scarring of the liver (cirrhosis), liver failure, or liver cancer.  The hepatitis C virus (HCV) causes this condition. The virus can pass from person to person (is contagious).  You should not take any medicines unless approved by your health care provider. This includes over-the-counter medicines and birth control pills. This information is not intended to replace advice given to you by your health care provider. Make sure you discuss any questions you have with your health care provider. Document Released: 08/10/2000 Document Revised: 09/18/2016 Document Reviewed: 09/18/2016 Elsevier Interactive Patient Education  2018 Elsevier Inc.  

## 2018-05-08 NOTE — Progress Notes (Signed)
Jonathon Bellows MD, MRCP(U.K) [address]  [place]  [place], [place] [address]  Main: [phone]  Fax: [phone]   Gastroenterology Consultation  Referring Provider:     Letta Median, MD Primary Care Physician:  Letta Median, MD Primary Gastroenterologist:  Dr. Jonathon Bellows  Reason for Consultation:     Hepatitis C         HPI:   BAYDEN GIL is a 67 y.o. y/o male referred for consultation & management  by Dr. Rebeca Alert, Durene Cal, MD.     He has been referred to see me today for hepatitis C.  He was admitted on 03/29/2018 with lower extremity pain edema and weakness.  A similar admission in July when he was treated for heart failure.  He is homeless.  He has not obtained his medications and it appears again he was admitted for possible heart failure.  He had been in presented earlier this year and was released in April 2019.  During his admission in 04/15/2018 he was treated for heart failure.  There was concern for noncompliance.  Presented to the emergency room again on 04/18/2018 with a mechanical fall.   Lab results 03/29/2018 hepatitis C genotype 1a, viral load 3.3 million.  03/31/2018 creatinine 1.08.  HIV negative.  Hemoglobin 11.1 with a platelet count of 132.  No recent liver imaging.  He has had it since 1985 , never been treated. Acquired it by a blood transfusion after a gall bladder surgery , no illegal drugs, does not drink any alcohol. One tattoos professional. Was in the [employer] .    Past Medical History:  Diagnosis Date  . Asthma   . CHF (congestive heart failure) ([place])   . COPD (chronic obstructive pulmonary disease) ([place])   . Diabetes mellitus without complication ([place])   . Homelessness   . Hypertension   . Migraine   . Obesity   . Sleep apnea     Past Surgical History:  Procedure Laterality Date  . CARDIAC CATHETERIZATION    . CHOLECYSTECTOMY    . EYE SURGERY    . INNER EAR SURGERY    . NOSE SURGERY      Prior to Admission  medications   Medication Sig Start Date End Date Taking? Authorizing Provider  acetaminophen (TYLENOL) 650 MG CR tablet Take 1 tablet (650 mg total) by mouth 2 (two) times daily as needed for pain. Patient taking differently: Take 1,300 mg by mouth daily.  03/31/18   Gouru, Illene Silver, MD  albuterol (PROVENTIL HFA;VENTOLIN HFA) 108 (90 Base) MCG/ACT inhaler Inhale 1-2 puffs into the lungs every 6 (six) hours as needed for wheezing or shortness of breath. 03/31/18   Nicholes Mango, MD  aspirin EC 81 MG tablet Take 81 mg by mouth daily.    [provider]  cephALEXin (KEFLEX) 500 MG capsule Take 1 capsule (500 mg total) by mouth 3 (three) times daily. 04/18/18   Menshew, Dannielle Karvonen, PA-C  cyclobenzaprine (FLEXERIL) 5 MG tablet Take 1 tablet (5 mg total) by mouth 3 (three) times daily as needed for muscle spasms. 04/18/18   Menshew, Dannielle Karvonen, PA-C  Dextromethorphan-guaiFENesin (WAL-TUSSIN COUGH/CHEST DM MAX) 10-200 MG/5ML LIQD Take 10 mLs by mouth every 4 (four) hours as needed.    [provider]  Dextrose, Diabetic Use, (DEXTROSE PO) Take by mouth.    [provider]  diphenhydrAMINE (BENADRYL) 25 MG tablet Take 50 mg by mouth daily.    [provider]  fluticasone (FLONASE) 50 MCG/ACT nasal spray Place 2 sprays into both nostrils 2 (two) times daily.    [provider]  furosemide (LASIX) 40 MG tablet Take 2 tablets (80 mg total) by mouth daily. Patient taking differently: Take 40 mg by mouth 2 (two) times daily.  04/16/18   Alisa Graff, FNP  HYDROcodone-acetaminophen (NORCO/VICODIN) 5-325 MG tablet Take 1-2 tablets by mouth 3 (three) times daily as needed for up to 10 doses for moderate pain or severe pain. 03/31/18   Gouru, Illene Silver, MD  lisinopril (PRINIVIL,ZESTRIL) 10 MG tablet Take 1 tablet (10 mg total) by mouth daily. 04/16/18   Alisa Graff, FNP  loperamide (LOPERAMIDE A-D) 2 MG tablet Take 4 mg by mouth as needed.     [provider]    meclizine (ANTIVERT) 25 MG tablet Take 25 mg by mouth every 6 (six) hours as needed for dizziness or nausea.     [provider]  metFORMIN (GLUCOPHAGE-XR) 500 MG 24 hr tablet Take 2 tablets (1,000 mg total) by mouth 2 (two) times daily. 03/31/18   Nicholes Mango, MD  Multiple Vitamin (MULTIVITAMIN WITH MINERALS) TABS tablet Take 1 tablet by mouth daily.    [provider]  omeprazole (PRILOSEC) 40 MG capsule Take 1 capsule (40 mg total) by mouth AC breakfast. 03/31/18   Gouru, Aruna, MD  potassium chloride SA (K-DUR,KLOR-CON) 20 MEQ tablet Take 1 tablet (20 mEq total) by mouth daily. 04/16/18   Alisa Graff, FNP  promethazine (PHENERGAN) 25 MG tablet Take 25 mg by mouth every 6 (six) hours as needed for nausea or vomiting.    [provider]  simvastatin (ZOCOR) 40 MG tablet Take 1 tablet (40 mg total) by mouth daily at 6 PM. 04/16/18   Alisa Graff, FNP  tamsulosin (FLOMAX) 0.4 MG CAPS capsule Take 0.4 mg by mouth daily.    [provider]    Family History  Problem Relation Age of Onset  . Hypertension Mother   . Heart disease Mother   . Heart failure Maternal Grandmother      Social History   Tobacco Use  . Smoking status: Never Smoker  . Smokeless tobacco: Never Used  Substance Use Topics  . Alcohol use: Never    Frequency: Never  . Drug use: Never    Allergies as of 05/08/2018 - Review Complete 05/08/2018  Allergen Reaction Noted  . Eggs or egg-derived products Nausea And Vomiting 03/20/2018  . Novocain [procaine]  03/28/2018  . Sulfa antibiotics  12/16/2017    Review of Systems:    All systems reviewed and negative except where noted in HPI.   Physical Exam:  BP (!) 153/74   Pulse (!) 56   Ht 5\' 6"  (1.676 m)   Wt 234 lb 12.8 oz (106.5 kg)   BMI 37.90 kg/m  No LMP for male patient. Psych:  Alert and cooperative. Normal mood and affect. General:   Alert,  Well-developed, well-nourished, pleasant and cooperative in NAD Head:   Normocephalic and atraumatic. Eyes:  Sclera clear, no icterus.   Conjunctiva pink. Ears:  Normal auditory acuity. Nose:  No deformity, discharge, or lesions. Mouth:  No deformity or lesions,oropharynx pink & moist. Neck:  Supple; no masses or thyromegaly. Lungs:  Respirations even and unlabored.  Clear throughout to auscultation.   No wheezes, crackles, or rhonchi. No acute distress. Heart:  Regular rate and rhythm; no murmurs, clicks, rubs, or gallops. Abdomen:  Normal bowel sounds.  No bruits.  Soft, non-tender and non-distended without masses, hepatosplenomegaly or hernias noted.  No guarding or rebound tenderness.    Neurologic:  Alert and oriented x3;  grossly normal neurologically. Skin:  Intact without significant lesions or rashes. No jaundice. Lymph Nodes:  No significant cervical adenopathy. Psych:  Alert and cooperative. Normal mood and affect.  Imaging Studies: Dg Cervical Spine Complete  Result Date: 04/18/2018 CLINICAL DATA:  Fall.  Right neck pain. EXAM: CERVICAL SPINE - COMPLETE 4+ VIEW COMPARISON:  None. FINDINGS: On the lateral view the cervical spine is visualized to the level of C6-7, with limited visualization of C7-T1. There is a normal cervical lordosis. Pre-vertebral soft tissues are within normal limits. No fracture is detected in the cervical spine. Dens is well positioned between the lateral masses of C1. Moderate multilevel cervical degenerative disc disease, most prominent at C6-7. Mild 2 mm retrolisthesis at C3-4. No evidence of facet subluxation. Mild bilateral facet arthropathy. Probable mild degenerative foraminal stenosis on the left at C4-5. No aggressive-appearing focal osseous lesions. IMPRESSION: 1. No cervical spine fracture. 2. Moderate multilevel degenerative changes in the cervical spine. 3. Minimal 2 mm retrolisthesis at C3-4. Electronically Signed   By: Ilona Sorrel M.D.   On: 04/18/2018 20:24   Dg Thoracic Spine 2 View  Result Date:  04/18/2018 CLINICAL DATA:  Fall.  Right mid back pain. EXAM: THORACIC SPINE 2 VIEWS COMPARISON:  None. FINDINGS: Thoracic vertebral body heights appear preserved, with no fracture or subluxation. Mild multilevel degenerative disc disease in the mid to lower thoracic spine. No suspicious focal osseous lesions. Surgical clips are seen in the right upper quadrant of the abdomen. Possible 4 mm right renal stone. IMPRESSION: No thoracic spine fracture or subluxation. Mild degenerative disc disease in the mid to lower thoracic spine. Electronically Signed   By: Ilona Sorrel M.D.   On: 04/18/2018 20:26   Dg Lumbar Spine Complete  Result Date: 04/18/2018 CLINICAL DATA:  Fall off curb.  Pain.  Initial encounter. EXAM: LUMBAR SPINE - COMPLETE 4+ VIEW COMPARISON:  None. FINDINGS: Five non rib-bearing lumbar type vertebral bodies are present. Vertebral body heights and alignment are normal. There is chronic loss of disc height at L5-S1 with advanced facet hypertrophy contributing to the foraminal narrowing bilaterally. Foraminal narrowing is suspected at L4-5. No acute fracture or traumatic subluxation is present. IMPRESSION: 1. No acute abnormality. 2. Degenerative changes are most severe at L5-S1 and L4-5. Electronically Signed   By: San Morelle M.D.   On: 04/18/2018 20:31    Assessment and Plan:   [name] is a 67 y.o. y/o male has been referred for  hepatitis C, GT1a, treatment naive, concern for compliance, was incarcerated till 11/2017. Here to see me for hepatitis C   Plan 1.  Check hepatitis A and B serologies.  Rule out hepatitis B coinfection. 2.  Ultrasound elastography to determine if he has liver cirrhosis. 3.  Urine drug screen. 4.  Based on the above results and depending on compliance with discuss other options of treatment for hepatitis C at his next visit.  Follow up in 4 weeks.    Dr Jonathon Bellows MD,MRCP(U.K)

## 2018-05-09 LAB — HEPATITIS A ANTIBODY, TOTAL: Hep A Total Ab: POSITIVE — AB

## 2018-05-09 LAB — HEPATITIS B SURFACE ANTIBODY,QUALITATIVE: Hep B Surface Ab, Qual: REACTIVE

## 2018-05-09 LAB — PROTIME-INR
INR: 1.2 (ref 0.8–1.2)
Prothrombin Time: 12.6 s — ABNORMAL HIGH (ref 9.1–12.0)

## 2018-05-09 LAB — HEPATITIS B CORE ANTIBODY, TOTAL: Hep B Core Total Ab: POSITIVE — AB

## 2018-05-09 LAB — HEPATITIS B SURFACE ANTIGEN: HEP B S AG: NEGATIVE

## 2018-05-12 ENCOUNTER — Telehealth: Payer: Self-pay

## 2018-05-12 NOTE — Telephone Encounter (Signed)
Spoke with pt and informed him of lab results. 

## 2018-05-12 NOTE — Telephone Encounter (Signed)
-----   Message from Jonathon Bellows, MD sent at 05/11/2018  2:22 PM EDT ----- Inform he is immune to hepatitis A.  Appears he has had prior hepatitis B , his body seems to have cleared the virus by itself and he is presently immune

## 2018-05-13 ENCOUNTER — Encounter (HOSPITAL_COMMUNITY): Payer: Self-pay

## 2018-05-13 ENCOUNTER — Other Ambulatory Visit (HOSPITAL_COMMUNITY): Payer: Self-pay

## 2018-05-13 NOTE — Progress Notes (Signed)
Bp: 98/60, pulse: 61, resp: 18, spo2: 97,  Last weight: 240 lbs todays weight: 230 lbs   Today visited with Miller, he states been doing good, staying active walking a lot. Swelling in down, still hs some.  He has lost 10 lbs in last couple of weeks. He states watching what he eats and watches his fluid intake. He is not able to watch everything with sodium due to bidget but has cut back.  Lungs are clear, denies chest pain, headaches, dizziness or shortness of breath.  Abdomen not tight feeling. He asked about activity, advised him as long as he is not pushing his self where he starts having chest pressure or pain, he should be good.  I advised start off slow then increase.  Meds were verified.  He takes them out of the bottles and states he has been taking them.  He states he not sure if he will be able to afford refills on some of his meds that will run out next week, He is taking Furosemide 80 mg twice daily.  Will continue to visit and make sure is compliance on his meds.  Continue to educate on heart failure and diet.   [place] [phone]

## 2018-05-14 ENCOUNTER — Ambulatory Visit: Admission: RE | Admit: 2018-05-14 | Payer: Medicare Other | Source: Ambulatory Visit

## 2018-05-20 ENCOUNTER — Other Ambulatory Visit (HOSPITAL_COMMUNITY): Payer: Self-pay

## 2018-05-20 NOTE — Progress Notes (Signed)
Bp: 100/62, pulse: 72, resp: 18, spo2: 97 last weight: 230 lbsTodays: 228 lbs  Todays visit he states he has been good. He stays active walking several blocks a day.  He states he watches what he eats as much as he can on fixed income.  He states he has been drinking 158 ounces water a day, I advised he needs to cut back on water to around 64 ounces.  He has plus 1 edema in lower extremities, better than his normal.  His weight is dropping but he states more active now than was before. Lungs are clear, denies shortness of breath. Verified meds, he is out of Lisinopril and flomax.  Contacted PCP to refill, may be ready tomorrow, took last ones today.  He denies headaches or dizziness.  He missed appt for a scan due to transportation, he advised he was going to reschedule it.  Asked if I could put meds in med box, he refused, he state easier in the bottles.  Will contact his pharmacy to try to get meds ordered after 3rd of month when he gets paid and will keep up with his meds, he has several with no refills that he will run out of in next couple of weeks. Will continue to visit and educate on heart failure.   [place] [phone]

## 2018-05-21 ENCOUNTER — Telehealth: Payer: Self-pay | Admitting: Gastroenterology

## 2018-05-21 NOTE — Telephone Encounter (Signed)
Pt left vm he missed an apt for  UPP[facility] GI and needs to reschedule please call pt

## 2018-05-22 NOTE — Telephone Encounter (Signed)
Returned pt call regarding rescheduling ultra sound. Pt states he does not have definite transportation. He uses the local public bus for transport. I have given pt the contact number to central scheduling to determine the best date and time to schedule appointment with radiology.

## 2018-05-26 ENCOUNTER — Ambulatory Visit: Payer: Medicare Other | Admitting: Gastroenterology

## 2018-05-27 ENCOUNTER — Ambulatory Visit
Admission: RE | Admit: 2018-05-27 | Discharge: 2018-05-27 | Disposition: A | Discharge status: Home / Self Care | Payer: Medicare Other | Source: Ambulatory Visit | Attending: Gastroenterology | Admitting: Gastroenterology

## 2018-05-27 DIAGNOSIS — B1920 Unspecified viral hepatitis C without hepatic coma: Secondary | ICD-10-CM | POA: Insufficient documentation

## 2018-05-27 DIAGNOSIS — K838 Other specified diseases of biliary tract: Secondary | ICD-10-CM | POA: Diagnosis not present

## 2018-05-29 ENCOUNTER — Emergency Department: Payer: Medicare Other

## 2018-05-29 ENCOUNTER — Emergency Department
Admission: EM | Admit: 2018-05-29 | Discharge: 2018-05-29 | Disposition: A | Discharge status: Home / Self Care | Payer: Medicare Other | Attending: Emergency Medicine | Admitting: Emergency Medicine

## 2018-05-29 ENCOUNTER — Encounter: Payer: Self-pay | Admitting: Emergency Medicine

## 2018-05-29 DIAGNOSIS — I110 Hypertensive heart disease with heart failure: Secondary | ICD-10-CM | POA: Insufficient documentation

## 2018-05-29 DIAGNOSIS — I5032 Chronic diastolic (congestive) heart failure: Secondary | ICD-10-CM | POA: Diagnosis not present

## 2018-05-29 DIAGNOSIS — R05 Cough: Secondary | ICD-10-CM | POA: Diagnosis not present

## 2018-05-29 DIAGNOSIS — J449 Chronic obstructive pulmonary disease, unspecified: Secondary | ICD-10-CM | POA: Diagnosis not present

## 2018-05-29 DIAGNOSIS — R059 Cough, unspecified: Secondary | ICD-10-CM

## 2018-05-29 LAB — BASIC METABOLIC PANEL
ANION GAP: 8 (ref 5–15)
BUN: 16 mg/dL (ref 8–23)
CHLORIDE: 105 mmol/L (ref 98–111)
CO2: 27 mmol/L (ref 22–32)
Calcium: 9 mg/dL (ref 8.9–10.3)
Creatinine, Ser: 1.11 mg/dL (ref 0.61–1.24)
GFR calc non Af Amer: >60 mL/min (ref >60)
Glucose, Bld: 132 mg/dL — ABNORMAL HIGH (ref 70–99)
Potassium: 3.4 mmol/L — ABNORMAL LOW (ref 3.5–5.1)
Sodium: 140 mmol/L (ref 135–145)

## 2018-05-29 LAB — CBC
HEMATOCRIT: 37.9 % — AB (ref 40.0–52.0)
HEMOGLOBIN: 12.8 g/dL — AB (ref 13.0–18.0)
MCH: 28.7 pg (ref 26.0–34.0)
MCHC: 33.7 g/dL (ref 32.0–36.0)
MCV: 85 fL (ref 80.0–100.0)
Platelets: 96 10*3/uL — ABNORMAL LOW (ref 150–440)
RBC: 4.46 MIL/uL (ref 4.40–5.90)
RDW: 18.2 % — ABNORMAL HIGH (ref 11.5–14.5)
WBC: 2.9 10*3/uL — ABNORMAL LOW (ref 3.8–10.6)

## 2018-05-29 LAB — TROPONIN I: Troponin I: <0.03 ng/mL (ref <0.03)

## 2018-05-29 MED ORDER — PREDNISONE 20 MG PO TABS
60.0000 mg | ORAL_TABLET | Freq: Once | ORAL | Status: AC
  Administered 2018-05-29: 60 mg via ORAL
  Filled 2018-05-29: qty 3

## 2018-05-29 MED ORDER — AZITHROMYCIN 250 MG PO TABS: ORAL_TABLET | ORAL | 0 refills | Status: DC

## 2018-05-29 MED ORDER — ONDANSETRON 4 MG PO TBDP
4.0000 mg | ORAL_TABLET | Freq: Once | ORAL | Status: AC
  Administered 2018-05-29: 4 mg via ORAL
  Filled 2018-05-29: qty 1

## 2018-05-29 MED ORDER — PREDNISONE 10 MG (21) PO TBPK: ORAL_TABLET | ORAL | 0 refills | Status: DC

## 2018-05-29 MED ORDER — ONDANSETRON 4 MG PO TBDP: 4.0000 mg | ORAL_TABLET | Freq: Three times a day (TID) | ORAL | 0 refills | Status: DC | PRN

## 2018-05-29 MED ORDER — AZITHROMYCIN 500 MG PO TABS
500.0000 mg | ORAL_TABLET | Freq: Once | ORAL | Status: AC
  Administered 2018-05-29: 500 mg via ORAL
  Filled 2018-05-29: qty 1

## 2018-05-29 NOTE — ED Provider Notes (Signed)
[facility] Emergency Department Provider Note       Time seen: ----------------------------------------- 12:26 PM on 05/29/2018 -----------------------------------------   I have reviewed the triage vital signs and the nursing notes.  HISTORY   Chief Complaint Cough; Chest Pain; and Shortness of Breath    HPI [name] is a 67 y.o. male with a history of asthma, CHF, COPD, diabetes, hypertension who presents to the ED for cough and difficulty breathing.  Patient felt like something was sitting on his chest.  Patient states that started when he started coughing and felt like he needed to vomit at the same time.  Currently he denies any complaints.  Past Medical History:  Diagnosis Date  . Asthma   . CHF (congestive heart failure) ([place])   . COPD (chronic obstructive pulmonary disease) ([place])   . Diabetes mellitus without complication ([place])   . Homelessness   . Hypertension   . Migraine   . Obesity   . Sleep apnea     Patient Active Problem List   Diagnosis Date Noted  . HTN (hypertension) 04/17/2018  . DM (diabetes mellitus) ([place]) 04/17/2018  . Lymphedema 04/17/2018  . Chronic diastolic CHF (congestive heart failure) ([place]) 04/07/2018  . Anemia 04/07/2018  . Bilateral leg pain 03/29/2018  . Chest pain 03/20/2018  . Adjustment disorder with mixed anxiety and depressed mood 12/16/2017  . Homelessness 12/16/2017    Past Surgical History:  Procedure Laterality Date  . CARDIAC CATHETERIZATION    . CHOLECYSTECTOMY    . EYE SURGERY    . INNER EAR SURGERY    . NOSE SURGERY      Allergies Eggs or egg-derived products; Novocain [procaine]; and Sulfa antibiotics  Social History Social History   Tobacco Use  . Smoking status: Never Smoker  . Smokeless tobacco: Never Used  Substance Use Topics  . Alcohol use: Never    Frequency: Never  . Drug use: Never   Review of Systems Constitutional: Negative for fever. Cardiovascular: Negative  for chest pain.  Positive for chest tightness Respiratory: Positive for shortness of breath and cough Gastrointestinal: Negative for abdominal pain, positive for vomiting Musculoskeletal: Negative for back pain. Skin: Negative for rash. Neurological: Negative for headaches, focal weakness or numbness.  All systems negative/normal/unremarkable except as stated in the HPI  ____________________________________________   PHYSICAL EXAM:  VITAL SIGNS: ED Triage Vitals  Enc Vitals Group     BP 05/29/18 1132 (!) 148/75     Pulse Rate 05/29/18 1132 (!) 48     Resp --      Temp --      Temp src --      SpO2 05/29/18 1132 100 %     Weight 05/29/18 0933 217 lb (98.4 kg)     Height 05/29/18 0933 5\' 6"  (1.676 m)     Head Circumference --      Peak Flow --      Pain Score 05/29/18 0933 5     Pain Loc --      Pain Edu? --      Excl. in [place]? --    Constitutional: Alert and oriented. Well appearing and in no distress. Eyes: Conjunctivae are normal. Normal extraocular movements. Cardiovascular: Normal rate, regular rhythm. No murmurs, rubs, or gallops. Respiratory: Normal respiratory effort without tachypnea nor retractions. Breath sounds are clear and equal bilaterally. No wheezes/rales/rhonchi. Gastrointestinal: Soft and nontender. Normal bowel sounds Musculoskeletal: Nontender with normal range of motion in extremities. No lower extremity tenderness  nor edema. Neurologic:  Normal speech and language. No gross focal neurologic deficits are appreciated.  Skin:  Skin is warm, dry and intact. No rash noted. Psychiatric: Mood and affect are normal. Speech and behavior are normal.  ____________________________________________  EKG: Interpreted by me.  Sinus bradycardia with a rate of 53 bpm, borderline long PR interval, normal QRS, normal QT  ____________________________________________  ED COURSE:  As part of my medical decision making, I reviewed the following data within the Rule History obtained from family if available, nursing notes, old chart and ekg, as well as notes from prior ED visits. Patient presented for cough and shortness of breath, we will assess with labs and imaging as indicated at this time.   Procedures ____________________________________________   LABS (pertinent positives/negatives)  Labs Reviewed  BASIC METABOLIC PANEL - Abnormal; Notable for the following components:      Result Value   Potassium 3.4 (*)    Glucose, Bld 132 (*)    All other components within normal limits  CBC - Abnormal; Notable for the following components:   WBC 2.9 (*)    Hemoglobin 12.8 (*)    HCT 37.9 (*)    RDW 18.2 (*)    Platelets 96 (*)    All other components within normal limits  TROPONIN I    RADIOLOGY Chest x-ray  IMPRESSION: Minimal chronic bronchitic changes without infiltrate. ____________________________________________  DIFFERENTIAL DIAGNOSIS   Pneumothorax, bronchitis, pneumonia, viral syndrome  FINAL ASSESSMENT AND PLAN  Cough   Plan: The patient had presented for cough and shortness of breath. Patient's labs did reveal mild pancytopenia not significantly changed from prior although his platelet counts have been dropping.  I did discuss this with the patient advised needs to be rechecked. Patient's imaging does not reveal pneumonia.  Will prescribe steroids, antibiotics and anti-medics for him.  His labs and work-up here are negative.  He is cleared for outpatient follow-up.   Laurence Aly, MD   Note: This note was generated in part or whole with voice recognition software. Voice recognition is usually quite accurate but there are transcription errors that can and very often do occur. I apologize for any typographical errors that were not detected and corrected.     Earleen Newport, MD 05/29/18 1228

## 2018-05-29 NOTE — ED Triage Notes (Signed)
Pt reports was walking about an hour ago and all the sudden coughed and felt like he could not breathe and feels like something is sitting on his chest.

## 2018-05-29 NOTE — ED Notes (Signed)
First Nurse Note: Report received from [facility]: patient found walking from [place] to [place] and stopped at [place] office complaining of chest pain, and vomiting.  BP 136/69, Ox sat 98%, P 56, R-16.  EKG - Normal sinus.  CBG - 188 prior to arrival, lung sounds clear per EMS.  Given 324 mg of ASA by EMS.

## 2018-05-30 ENCOUNTER — Other Ambulatory Visit: Payer: Self-pay

## 2018-05-30 ENCOUNTER — Telehealth: Payer: Self-pay

## 2018-05-30 DIAGNOSIS — K831 Obstruction of bile duct: Secondary | ICD-10-CM

## 2018-05-30 DIAGNOSIS — B1920 Unspecified viral hepatitis C without hepatic coma: Secondary | ICD-10-CM

## 2018-05-30 NOTE — Telephone Encounter (Signed)
-----   Message from Jonathon Bellows, MD sent at 05/30/2018  9:32 AM EDT ----- Sherald Hess the ultrasound is abnormal. The common bile duct is 18 mm which is very dilated.   Suggest   1. Check LFt's  2. MRCP 3. Follow up in office after results

## 2018-05-30 NOTE — Telephone Encounter (Signed)
Spoke with pt and informed him of ultrasound result and Dr. Georgeann Oppenheim instructions for pt to have a MRCP performed and lab collection to check LFTs. Pt states he does not have reliable transportation so he will call central scheduling to set his radiology appointment. Pt is coming to the office for labs next week.

## 2018-06-04 ENCOUNTER — Other Ambulatory Visit (HOSPITAL_COMMUNITY): Payer: Self-pay

## 2018-06-04 ENCOUNTER — Encounter (HOSPITAL_COMMUNITY): Payer: Self-pay

## 2018-06-04 NOTE — Progress Notes (Signed)
Visit today with [name] he was at a friends house.  He states been feeling good.  Lungs clear, Denies headaches, diziness and chest pain.  He states stays active.  He has gained weight, but he states not fluid, legs has no more edema than normal. He states been eating good. He tries to eat healthier and watches his fluid intake.  He was drinking tea and drank coke today, advised him to be careful with soft drinks.  Taked more about diet and fluid intake. He states has most of his meds, he is out of some of his pain meds.  He states he needs to see his PCP.  He prefers to take his meds out of the bottles.  He did not have them with him.  Meds verfied.  Will continue to visit weekly.   [name] [phone]

## 2018-06-05 ENCOUNTER — Other Ambulatory Visit: Payer: Self-pay

## 2018-06-05 ENCOUNTER — Encounter: Payer: Self-pay | Admitting: Gastroenterology

## 2018-06-05 ENCOUNTER — Ambulatory Visit (INDEPENDENT_AMBULATORY_CARE_PROVIDER_SITE_OTHER): Payer: Medicare Other | Admitting: Gastroenterology

## 2018-06-05 VITALS — BP 128/71 | HR 71 | Ht 66.0 in | Wt 234.6 lb

## 2018-06-05 DIAGNOSIS — K7460 Unspecified cirrhosis of liver: Secondary | ICD-10-CM

## 2018-06-05 DIAGNOSIS — Z1211 Encounter for screening for malignant neoplasm of colon: Secondary | ICD-10-CM

## 2018-06-05 DIAGNOSIS — K831 Obstruction of bile duct: Secondary | ICD-10-CM | POA: Diagnosis not present

## 2018-06-05 DIAGNOSIS — B1920 Unspecified viral hepatitis C without hepatic coma: Secondary | ICD-10-CM | POA: Diagnosis not present

## 2018-06-05 NOTE — Progress Notes (Signed)
Jonathon Bellows MD, MRCP(U.K) [address]  [place]  [place], [place] [address]  Main: [phone]  Fax: [phone]   Primary Care Physician: Letta Median, MD  Primary Gastroenterologist:  Dr. Jonathon Bellows   Chief Complaint  Patient presents with  . Follow-up    Hep C    HPI: [name] is a 67 y.o. male  Summary of history : He is here today to see me to follow-up for  hepatitis C genotype 1a, viral load 3.3 million.Marland Kitchen  He was admitted on 03/29/2018 with lower extremity pain edema and weakness.  A similar admission in July when he was treated for heart failure.  He is homeless. There has been a concern for noncompliance.  Presented to the emergency room again on 04/18/2018 with a mechanical fall.  Lab results 03/29/2018 hepatitis C genotype 1a, viral load 3.3 million.  03/31/2018 creatinine 1.08.  HIV negative.  Hemoglobin 11.1 with a platelet count of 132.  No recent liver imaging.  He has otitis he since 1985 , never been treated. Acquired it by a blood transfusion after a gall bladder surgery , no illegal drugs, does not drink any alcohol. One tattoos professional. Was in the [employer] .    Interval history 05/08/2018 to 06/05/2018 05/08/2018: Immune to hepatitis A, hepatitis B core antibody positive suggesting of prior infection.  Hepatitis B surface antigen negative.  Appetite is B surface antibody positive, INR 1.2, did not obtain urine drug screen.  Myoglobin 12.8 g with an MCV of 85 and a platelet count of 96.  Creatinine 1.1.  05/27/2018: F3 F4 fibrosis.  Markedly dilated common bile duct up to 18 mm MRCP required.   Never had a colonoscopy - no family history of colon cancer  Current Outpatient Medications  Medication Sig Dispense Refill  . albuterol (PROVENTIL HFA;VENTOLIN HFA) 108 (90 Base) MCG/ACT inhaler Inhale 1-2 puffs into the lungs every 6 (six) hours as needed for wheezing or shortness of breath. 1 Inhaler 0  . aspirin EC 81 MG tablet Take 81 mg by mouth  daily.    . cephALEXin (KEFLEX) 500 MG capsule Take 1 capsule (500 mg total) by mouth 3 (three) times daily. 21 capsule 0  . cyclobenzaprine (FLEXERIL) 5 MG tablet Take 1 tablet (5 mg total) by mouth 3 (three) times daily as needed for muscle spasms. 15 tablet 0  . Dextrose, Diabetic Use, (DEXTROSE PO) Take by mouth.    . diphenhydrAMINE (BENADRYL) 25 MG tablet Take 50 mg by mouth daily.    . fluticasone (FLONASE) 50 MCG/ACT nasal spray Place 2 sprays into both nostrils 2 (two) times daily.    . furosemide (LASIX) 40 MG tablet Take 2 tablets (80 mg total) by mouth daily. (Patient taking differently: Take 40 mg by mouth 2 (two) times daily. ) 60 tablet 5  . lisinopril (PRINIVIL,ZESTRIL) 10 MG tablet Take 1 tablet (10 mg total) by mouth daily. 30 tablet 5  . loperamide (LOPERAMIDE A-D) 2 MG tablet Take 4 mg by mouth as needed.     . meclizine (ANTIVERT) 25 MG tablet Take 25 mg by mouth every 6 (six) hours as needed for dizziness or nausea.     . metFORMIN (GLUCOPHAGE-XR) 500 MG 24 hr tablet Take 2 tablets (1,000 mg total) by mouth 2 (two) times daily. 60 tablet 1  . Multiple Vitamin (MULTIVITAMIN WITH MINERALS) TABS tablet Take 1 tablet by mouth daily.    Marland Kitchen omeprazole (PRILOSEC) 40 MG capsule Take 1  capsule (40 mg total) by mouth AC breakfast. 30 capsule 0  . potassium chloride SA (K-DUR,KLOR-CON) 20 MEQ tablet Take 1 tablet (20 mEq total) by mouth daily. 30 tablet 5  . promethazine (PHENERGAN) 25 MG tablet Take 25 mg by mouth every 6 (six) hours as needed for nausea or vomiting.    . simvastatin (ZOCOR) 40 MG tablet Take 1 tablet (40 mg total) by mouth daily at 6 PM. 30 tablet 5  . tamsulosin (FLOMAX) 0.4 MG CAPS capsule Take 0.4 mg by mouth daily.    Marland Kitchen acetaminophen (TYLENOL) 650 MG CR tablet Take 1 tablet (650 mg total) by mouth 2 (two) times daily as needed for pain. (Patient not taking: Reported on 06/04/2018)    . azithromycin (ZITHROMAX Z-PAK) 250 MG tablet Take 1 tablet by mouth daily starting  tomorrow (Patient not taking: Reported on 06/04/2018) 4 each 0  . Dextromethorphan-guaiFENesin (WAL-TUSSIN COUGH/CHEST DM MAX) 10-200 MG/5ML LIQD Take 10 mLs by mouth every 4 (four) hours as needed.    Marland Kitchen HYDROcodone-acetaminophen (NORCO/VICODIN) 5-325 MG tablet Take 1-2 tablets by mouth 3 (three) times daily as needed for up to 10 doses for moderate pain or severe pain. (Patient not taking: Reported on 05/20/2018) 10 tablet 0  . ondansetron (ZOFRAN ODT) 4 MG disintegrating tablet Take 1 tablet (4 mg total) by mouth every 8 (eight) hours as needed for nausea or vomiting. (Patient not taking: Reported on 06/04/2018) 20 tablet 0  . predniSONE (STERAPRED UNI-PAK 21 TAB) 10 MG (21) TBPK tablet Dispense taper pack as directed (Patient not taking: Reported on 06/05/2018) 21 tablet 0   No current facility-administered medications for this visit.     Allergies as of 06/05/2018 - Review Complete 06/05/2018  Allergen Reaction Noted  . Eggs or egg-derived products Nausea And Vomiting 03/20/2018  . Novocain [procaine]  03/28/2018  . Sulfa antibiotics  12/16/2017    ROS:  General: Negative for anorexia, weight loss, fever, chills, fatigue, weakness. ENT: Negative for hoarseness, difficulty swallowing , nasal congestion. CV: Negative for chest pain, angina, palpitations, dyspnea on exertion, peripheral edema.  Respiratory: Negative for dyspnea at rest, dyspnea on exertion, cough, sputum, wheezing.  GI: See history of present illness. GU:  Negative for dysuria, hematuria, urinary incontinence, urinary frequency, nocturnal urination.  Endo: Negative for unusual weight change.    Physical Examination:   BP 128/71   Pulse 71   Ht 5\' 6"  (1.676 m)   Wt 234 lb 9.6 oz (106.4 kg)   BMI 37.87 kg/m   General: Well-nourished, well-developed in no acute distress.  Eyes: No icterus. Conjunctivae pink. Mouth: Oropharyngeal mucosa moist and pink , no lesions erythema or exudate. Lungs: Clear to auscultation  bilaterally. Non-labored. Heart: Regular rate and rhythm, no murmurs rubs or gallops.  Abdomen: Bowel sounds are normal, nontender, nondistended, no hepatosplenomegaly or masses, no abdominal bruits or hernia , no rebound or guarding.   Extremities: No lower extremity edema. No clubbing or deformities. Neuro: Alert and oriented x 3.  Grossly intact. Skin: Warm and dry, no jaundice.   Psych: Alert and cooperative, normal mood and affect.   Imaging Studies: Dg Chest 2 View  Result Date: 05/29/2018 CLINICAL DATA:  Cough, shortness of breath, chest pain and asthma attack this morning, history hypertension, diabetes mellitus, COPD EXAM: CHEST - 2 VIEW COMPARISON:  03/29/2018 FINDINGS: Normal heart size, mediastinal contours, and pulmonary vascularity. Minimal chronic central peribronchial thickening. Lungs otherwise clear. No infiltrate, pleural effusion or pneumothorax. Bones demineralized. IMPRESSION: Minimal chronic  bronchitic changes without infiltrate. Electronically Signed   By: Lavonia Dana M.D.   On: 05/29/2018 10:58   US Abdomen Complete W/elastography  Result Date: 05/27/2018 CLINICAL DATA:  Hepatitis-C EXAM: ULTRASOUND ABDOMEN ULTRASOUND HEPATIC ELASTOGRAPHY TECHNIQUE: Sonography of the upper abdomen was performed. In addition, ultrasound elastography evaluation of the liver was performed. A region of interest was placed within the right lobe of the liver. Following application of a compressive sonographic pulse, shear waves were detected in the adjacent hepatic tissue and the shear wave velocity was calculated. Multiple assessments were performed at the selected site. Median shear wave velocity is correlated to a Metavir fibrosis score. COMPARISON:  Renal ultrasound 03/29/2018 FINDINGS: ULTRASOUND ABDOMEN Gallbladder: Prior cholecystectomy Common bile duct: Diameter: Markedly dilated, 18 mm. No visible ductal stone. Liver: Coarsened echotexture and nodular contours compatible with cirrhosis. No  focal hepatic abnormality. Portal vein is patent on color Doppler imaging with normal direction of blood flow towards the liver. IVC: No abnormality visualized. Pancreas: No visible obstructing mass in the pancreatic head. Spleen: Size and appearance within normal limits. Right Kidney: Length: 10.1 cm. 6 mm shadowing stone in the lower pole, nonobstructing. Echogenicity within normal limits. No mass or hydronephrosis visualized. Left Kidney: Length: 9.1 cm. Echogenicity within normal limits. No mass or hydronephrosis visualized. Abdominal aorta: No aneurysm visualized. Other findings: Probable recanalization of the umbilical vein. ULTRASOUND HEPATIC ELASTOGRAPHY Device: Siemens Helix VTQ Patient position: Supine Transducer 4V1 Number of measurements: 10 Hepatic segment:  8 Median velocity:   2.24 m/sec IQR: 0.16 IQR/Median velocity ratio: 0.07 Corresponding Metavir fibrosis score:  Some F3 + F4 Risk of fibrosis: High Limitations of exam: None Please note that abnormal shear wave velocities may also be identified in clinical settings other than with hepatic fibrosis, such as: acute hepatitis, elevated right heart and central venous pressures including use of beta blockers, veno-occlusive disease (Budd-Chiari), infiltrative processes such as mastocytosis/amyloidosis/infiltrative tumor, extrahepatic cholestasis, in the post-prandial state, and liver transplantation. Correlation with patient history, laboratory data, and clinical condition recommended. IMPRESSION: ULTRASOUND ABDOMEN: Coarsened echotexture throughout the liver with nodular contours compatible with cirrhosis. Probable recanalization of the umbilical vein. Markedly dilated common bile duct, measuring up to 18 mm. This could be further evaluated with CT with contrast or MRCP. ULTRASOUND HEPATIC ELASTOGRAPHY: Median hepatic shear wave velocity is calculated at 2.24 m/sec. Corresponding Metavir fibrosis score is Some F3 + F4. Risk of fibrosis is High.  Follow-up: Follow up advised Electronically Signed   By: Rolm Baptise M.D.   On: 05/27/2018 10:21    Assessment and Plan:   [name] is a 67 y.o. y/o male here to follow up   hepatitis C, GT1a, treatment naive, concern for compliance, was incarcerated till 11/2017. Happears he is spontaneously cleared hepatitis B.  Hepatitis B surface antibody positive.  HIV negative.  F3 F4 fibrosis seen on elastography.  Biochemically he has a low platelet count suggestive of portal hypertension.  Incidentally his common bile duct was dilated to 18 mm and requires further evaluation.  Plan 1.  EGD to screen for esophageal varices, screening colonoscopy  2.  Obtain MRCP to evaluate common bile duct which has been dilated. 3.  Urine drug screen.LFT 4.  Treatment of t hepatitis C will be discussed after we obtain the results of his MRCP and evaluate why he does have a dilated common bile duct.  I have discussed alternative options, risks & benefits,  which include, but are not limited to, bleeding, infection, perforation,respiratory complication &  drug reaction.  The patient agrees with this plan & written consent will be obtained.    Dr Jonathon Bellows  MD,MRCP [facility]) Follow up in 4 weeks

## 2018-06-06 ENCOUNTER — Telehealth: Payer: Self-pay | Admitting: Cardiovascular Disease

## 2018-06-06 LAB — HEPATIC FUNCTION PANEL
ALK PHOS: 57 IU/L (ref 39–117)
ALT: 27 IU/L (ref 0–44)
AST: 31 IU/L (ref 0–40)
Albumin: 3.3 g/dL — ABNORMAL LOW (ref 3.6–4.8)
BILIRUBIN TOTAL: 1.6 mg/dL — AB (ref 0.0–1.2)
BILIRUBIN, DIRECT: 0.59 mg/dL — AB (ref 0.00–0.40)
TOTAL PROTEIN: 6.5 g/dL (ref 6.0–8.5)

## 2018-06-06 LAB — URINE DRUG PANEL 7
AMPHETAMINES, URINE: NEGATIVE ng/mL
Barbiturate Quant, Ur: NEGATIVE ng/mL
Benzodiazepine Quant, Ur: NEGATIVE ng/mL
CANNABINOID QUANT UR: NEGATIVE ng/mL
COCAINE (METAB.): NEGATIVE ng/mL
Opiate Quant, Ur: NEGATIVE ng/mL
PCP QUANT UR: NEGATIVE ng/mL

## 2018-06-06 NOTE — Telephone Encounter (Signed)
° °  [facility] Pre-operative Risk Assessment    Request for surgical clearance:  1. What type of surgery is being performed?  EGD Colonoscopy  2. When is this surgery scheduled? Pending   3. What type of clearance is required (medical clearance vs. Pharmacy clearance to hold med vs. Both)? Medical   4. Are there any medications that need to be held prior to surgery and how long? Not noted   5. Practice name and name of physician performing surgery?  Dr. [facility]   6. What is your office phone number [phone]   7.   What is your office fax number [phone]   8.   Anesthesia type (None, local, MAC, general) ? Not noted    Clarisse Gouge 06/06/2018, 2:02 PM  _________________________________________________________________   (provider comments below)

## 2018-06-06 NOTE — Telephone Encounter (Signed)
Last myoview 02/2018. Last echo 02/2018.

## 2018-06-07 ENCOUNTER — Other Ambulatory Visit: Payer: Self-pay

## 2018-06-07 ENCOUNTER — Emergency Department
Admission: EM | Admit: 2018-06-07 | Discharge: 2018-06-07 | Disposition: A | Discharge status: Home / Self Care | Payer: Medicare Other | Attending: Emergency Medicine | Admitting: Emergency Medicine

## 2018-06-07 ENCOUNTER — Encounter: Payer: Self-pay | Admitting: Emergency Medicine

## 2018-06-07 ENCOUNTER — Emergency Department: Payer: Medicare Other

## 2018-06-07 DIAGNOSIS — J320 Chronic maxillary sinusitis: Secondary | ICD-10-CM | POA: Diagnosis not present

## 2018-06-07 DIAGNOSIS — Z7982 Long term (current) use of aspirin: Secondary | ICD-10-CM | POA: Insufficient documentation

## 2018-06-07 DIAGNOSIS — I110 Hypertensive heart disease with heart failure: Secondary | ICD-10-CM | POA: Diagnosis not present

## 2018-06-07 DIAGNOSIS — J449 Chronic obstructive pulmonary disease, unspecified: Secondary | ICD-10-CM | POA: Insufficient documentation

## 2018-06-07 DIAGNOSIS — M791 Myalgia, unspecified site: Secondary | ICD-10-CM | POA: Diagnosis not present

## 2018-06-07 DIAGNOSIS — I5032 Chronic diastolic (congestive) heart failure: Secondary | ICD-10-CM | POA: Insufficient documentation

## 2018-06-07 DIAGNOSIS — Z79899 Other long term (current) drug therapy: Secondary | ICD-10-CM | POA: Diagnosis not present

## 2018-06-07 DIAGNOSIS — E119 Type 2 diabetes mellitus without complications: Secondary | ICD-10-CM | POA: Insufficient documentation

## 2018-06-07 DIAGNOSIS — R0602 Shortness of breath: Secondary | ICD-10-CM | POA: Insufficient documentation

## 2018-06-07 DIAGNOSIS — Z7984 Long term (current) use of oral hypoglycemic drugs: Secondary | ICD-10-CM | POA: Diagnosis not present

## 2018-06-07 LAB — CBC WITH DIFFERENTIAL/PLATELET
Abs Immature Granulocytes: 0.02 10*3/uL (ref 0.00–0.07)
Basophils Absolute: 0 10*3/uL (ref 0.0–0.1)
Basophils Relative: 1 %
Eosinophils Absolute: 0.1 10*3/uL (ref 0.0–0.5)
Eosinophils Relative: 3 %
HCT: 36 % — ABNORMAL LOW (ref 39.0–52.0)
HEMOGLOBIN: 11.3 g/dL — AB (ref 13.0–17.0)
IMMATURE GRANULOCYTES: 0 %
Lymphocytes Relative: 9 %
Lymphs Abs: 0.5 10*3/uL — ABNORMAL LOW (ref 0.7–4.0)
MCH: 27.5 pg (ref 26.0–34.0)
MCHC: 31.4 g/dL (ref 30.0–36.0)
MCV: 87.6 fL (ref 80.0–100.0)
MONO ABS: 0.6 10*3/uL (ref 0.1–1.0)
MONOS PCT: 10 %
NEUTROS ABS: 4.3 10*3/uL (ref 1.7–7.7)
NEUTROS PCT: 77 %
Platelets: 121 10*3/uL — ABNORMAL LOW (ref 150–400)
RBC: 4.11 MIL/uL — AB (ref 4.22–5.81)
RDW: 17 % — ABNORMAL HIGH (ref 11.5–15.5)
WBC: 5.6 10*3/uL (ref 4.0–10.5)
nRBC: 0 % (ref 0.0–0.2)

## 2018-06-07 LAB — BASIC METABOLIC PANEL
Anion gap: 6 (ref 5–15)
BUN: 11 mg/dL (ref 8–23)
CALCIUM: 8.7 mg/dL — AB (ref 8.9–10.3)
CO2: 25 mmol/L (ref 22–32)
CREATININE: 1.02 mg/dL (ref 0.61–1.24)
Chloride: 110 mmol/L (ref 98–111)
GFR calc Af Amer: >60 mL/min (ref >60)
GFR calc non Af Amer: >60 mL/min (ref >60)
GLUCOSE: 185 mg/dL — AB (ref 70–99)
Potassium: 3.2 mmol/L — ABNORMAL LOW (ref 3.5–5.1)
Sodium: 141 mmol/L (ref 135–145)

## 2018-06-07 LAB — TROPONIN I: Troponin I: <0.03 ng/mL (ref <0.03)

## 2018-06-07 LAB — BRAIN NATRIURETIC PEPTIDE: B Natriuretic Peptide: 305 pg/mL — ABNORMAL HIGH (ref 0.0–100.0)

## 2018-06-07 MED ORDER — IPRATROPIUM-ALBUTEROL 0.5-2.5 (3) MG/3ML IN SOLN
9.0000 mL | Freq: Once | RESPIRATORY_TRACT | Status: AC
  Administered 2018-06-07: 9 mL via RESPIRATORY_TRACT
  Filled 2018-06-07: qty 9

## 2018-06-07 MED ORDER — DOXYCYCLINE HYCLATE 100 MG PO TABS
100.0000 mg | ORAL_TABLET | Freq: Once | ORAL | Status: AC
  Administered 2018-06-07: 100 mg via ORAL
  Filled 2018-06-07: qty 1

## 2018-06-07 MED ORDER — DOXYCYCLINE HYCLATE 100 MG PO CAPS: 100.0000 mg | ORAL_CAPSULE | Freq: Two times a day (BID) | ORAL | 0 refills | Status: DC

## 2018-06-07 MED ORDER — METHYLPREDNISOLONE SODIUM SUCC 125 MG IJ SOLR
125.0000 mg | Freq: Once | INTRAMUSCULAR | Status: AC
  Administered 2018-06-07: 125 mg via INTRAVENOUS
  Filled 2018-06-07: qty 2

## 2018-06-07 MED ORDER — PREDNISONE 20 MG PO TABS: 40.0000 mg | ORAL_TABLET | Freq: Every day | ORAL | 0 refills | Status: DC

## 2018-06-07 NOTE — ED Triage Notes (Signed)
Pt presents to ED via [facility] with c/o cough and SOB since last night. Pt presents with productive cough, reports last night he had productive cough. VSS en route. Pt able to speak in complete sentences without difficulty.

## 2018-06-07 NOTE — ED Provider Notes (Signed)
[facility] Emergency Department Provider Note  ____________________________________________   First MD Initiated Contact with Patient 06/07/18 1322     (approximate)  I have reviewed the triage vital signs and the nursing notes.   HISTORY  Chief Complaint Shortness of Breath   HPI [name] is a 67 y.o. male with a history of CHF as well as COPD who is presenting to the emergency department today with 2 days of worsening shortness of breath as well as chest pain.  He says that the symptoms started yesterday and he now has aching across the front of his chest and denies any radiation to his bilateral arms, back or neck.  Says that he also has a nonproductive cough.  Is taking albuterol puffs at home off of his inhaler without relief.  Brought in by EMS without any hypoxia.  Has not been given any nebs or steroids in route.   Past Medical History:  Diagnosis Date  . Asthma   . CHF (congestive heart failure) ([place])   . COPD (chronic obstructive pulmonary disease) ([place])   . Diabetes mellitus without complication ([place])   . Homelessness   . Hypertension   . Migraine   . Obesity   . Sleep apnea     Patient Active Problem List   Diagnosis Date Noted  . HTN (hypertension) 04/17/2018  . DM (diabetes mellitus) ([place]) 04/17/2018  . Lymphedema 04/17/2018  . Chronic diastolic CHF (congestive heart failure) ([place]) 04/07/2018  . Anemia 04/07/2018  . Bilateral leg pain 03/29/2018  . Chest pain 03/20/2018  . Adjustment disorder with mixed anxiety and depressed mood 12/16/2017  . Homelessness 12/16/2017    Past Surgical History:  Procedure Laterality Date  . CARDIAC CATHETERIZATION    . CHOLECYSTECTOMY    . EYE SURGERY    . INNER EAR SURGERY    . NOSE SURGERY      Prior to Admission medications   Medication Sig Start Date End Date Taking? Authorizing Provider  acetaminophen (TYLENOL) 650 MG CR tablet Take 1 tablet (650 mg total) by mouth 2 (two)  times daily as needed for pain. Patient not taking: Reported on 06/04/2018 03/31/18   Nicholes Mango, MD  albuterol (PROVENTIL HFA;VENTOLIN HFA) 108 (90 Base) MCG/ACT inhaler Inhale 1-2 puffs into the lungs every 6 (six) hours as needed for wheezing or shortness of breath. 03/31/18   Nicholes Mango, MD  aspirin EC 81 MG tablet Take 81 mg by mouth daily.    [provider]  azithromycin (ZITHROMAX Z-PAK) 250 MG tablet Take 1 tablet by mouth daily starting tomorrow Patient not taking: Reported on 06/04/2018 05/29/18   Earleen Newport, MD  cephALEXin (KEFLEX) 500 MG capsule Take 1 capsule (500 mg total) by mouth 3 (three) times daily. 04/18/18   Menshew, Dannielle Karvonen, PA-C  cyclobenzaprine (FLEXERIL) 5 MG tablet Take 1 tablet (5 mg total) by mouth 3 (three) times daily as needed for muscle spasms. 04/18/18   Menshew, Dannielle Karvonen, PA-C  Dextromethorphan-guaiFENesin (WAL-TUSSIN COUGH/CHEST DM MAX) 10-200 MG/5ML LIQD Take 10 mLs by mouth every 4 (four) hours as needed.    [provider]  Dextrose, Diabetic Use, (DEXTROSE PO) Take by mouth.    [provider]  diphenhydrAMINE (BENADRYL) 25 MG tablet Take 50 mg by mouth daily.    [provider]  fluticasone (FLONASE) 50 MCG/ACT nasal spray Place 2 sprays into both nostrils 2 (two) times daily.    [provider]  furosemide (  LASIX) 40 MG tablet Take 2 tablets (80 mg total) by mouth daily. Patient taking differently: Take 40 mg by mouth 2 (two) times daily.  04/16/18   Alisa Graff, FNP  HYDROcodone-acetaminophen (NORCO/VICODIN) 5-325 MG tablet Take 1-2 tablets by mouth 3 (three) times daily as needed for up to 10 doses for moderate pain or severe pain. Patient not taking: Reported on 05/20/2018 03/31/18   Nicholes Mango, MD  lisinopril (PRINIVIL,ZESTRIL) 10 MG tablet Take 1 tablet (10 mg total) by mouth daily. 04/16/18   Alisa Graff, FNP  loperamide (LOPERAMIDE A-D) 2 MG tablet Take 4 mg by mouth as needed.      [provider]  meclizine (ANTIVERT) 25 MG tablet Take 25 mg by mouth every 6 (six) hours as needed for dizziness or nausea.     [provider]  metFORMIN (GLUCOPHAGE-XR) 500 MG 24 hr tablet Take 2 tablets (1,000 mg total) by mouth 2 (two) times daily. 03/31/18   Nicholes Mango, MD  Multiple Vitamin (MULTIVITAMIN WITH MINERALS) TABS tablet Take 1 tablet by mouth daily.    [provider]  omeprazole (PRILOSEC) 40 MG capsule Take 1 capsule (40 mg total) by mouth AC breakfast. 03/31/18   Gouru, Illene Silver, MD  ondansetron (ZOFRAN ODT) 4 MG disintegrating tablet Take 1 tablet (4 mg total) by mouth every 8 (eight) hours as needed for nausea or vomiting. Patient not taking: Reported on 06/04/2018 05/29/18   Earleen Newport, MD  potassium chloride SA (K-DUR,KLOR-CON) 20 MEQ tablet Take 1 tablet (20 mEq total) by mouth daily. 04/16/18   Alisa Graff, FNP  predniSONE (STERAPRED UNI-PAK 21 TAB) 10 MG (21) TBPK tablet Dispense taper pack as directed Patient not taking: Reported on 06/05/2018 05/29/18   Earleen Newport, MD  promethazine (PHENERGAN) 25 MG tablet Take 25 mg by mouth every 6 (six) hours as needed for nausea or vomiting.    [provider]  simvastatin (ZOCOR) 40 MG tablet Take 1 tablet (40 mg total) by mouth daily at 6 PM. 04/16/18   Alisa Graff, FNP  tamsulosin (FLOMAX) 0.4 MG CAPS capsule Take 0.4 mg by mouth daily.    [provider]    Allergies Eggs or egg-derived products; Novocain [procaine]; and Sulfa antibiotics  Family History  Problem Relation Age of Onset  . Hypertension Mother   . Heart disease Mother   . Heart failure Maternal Grandmother     Social History Social History   Tobacco Use  . Smoking status: Never Smoker  . Smokeless tobacco: Never Used  Substance Use Topics  . Alcohol use: Never    Frequency: Never  . Drug use: Never    Review of Systems  Constitutional: No fever/chills Eyes: No visual  changes. ENT: No sore throat. Cardiovascular: As above Respiratory: As above Gastrointestinal: No abdominal pain.  No nausea, no vomiting.  No diarrhea.  No constipation. Genitourinary: Negative for dysuria. Musculoskeletal: Negative for back pain. Skin: Negative for rash. Neurological: Negative for headaches, focal weakness or numbness.   ____________________________________________   PHYSICAL EXAM:  VITAL SIGNS: ED Triage Vitals  Enc Vitals Group     BP 06/07/18 1325 (!) 156/69     Pulse Rate 06/07/18 1325 62     Resp 06/07/18 1325 17     Temp 06/07/18 1325 99.5 F (37.5 C)     Temp Source 06/07/18 1325 Oral     SpO2 06/07/18 1321 100 %     Weight 06/07/18 1322 234 lb  9.6 oz (106.4 kg)     Height 06/07/18 1322 5\' 6"  (1.676 m)     Head Circumference --      Peak Flow --      Pain Score 06/07/18 1322 9     Pain Loc --      Pain Edu? --      Excl. in [place]? --     Constitutional: Alert and oriented. Well appearing and in no acute distress. Eyes: Conjunctivae are normal.  Head: Atraumatic. Nose: No congestion/rhinnorhea. Mouth/Throat: Mucous membranes are moist.  Neck: No stridor.    Cardiovascular: Normal rate, regular rhythm. Grossly normal heart sounds.   Respiratory: Normal respiratory effort.  No retractions.  Expiratory cough.  Lungs are clear throughout. Gastrointestinal: Soft and nontender. No distention. No CVA tenderness. Musculoskeletal: No lower extremity tenderness nor edema.  No joint effusions. Neurologic:  Normal speech and language. No gross focal neurologic deficits are appreciated. Skin:  Skin is warm, dry and intact. No rash noted. Psychiatric: Mood and affect are normal. Speech and behavior are normal.  ____________________________________________   LABS (all labs ordered are listed, but only abnormal results are displayed)  Labs Reviewed  CBC WITH DIFFERENTIAL/PLATELET  BASIC METABOLIC PANEL  TROPONIN I  BRAIN NATRIURETIC PEPTIDE    ____________________________________________  EKG  ED ECG REPORT I, Doran Stabler, the attending physician, personally viewed and interpreted this ECG.   Date: 06/07/2018  EKG Time: 1323  Rate: 62  Rhythm: normal sinus rhythm  Axis: Normal  Intervals:none  ST&T Change: No ST segment elevation or depression.  No abnormal T wave inversion.  ____________________________________________  RADIOLOGY  No acute cardiopulmonary disease. ____________________________________________   PROCEDURES  Procedure(s) performed:   Procedures  Critical Care performed:   ____________________________________________   INITIAL IMPRESSION / ASSESSMENT AND PLAN / ED COURSE  Pertinent labs & imaging results that were available during my care of the patient were reviewed by me and considered in my medical decision making (see chart for details).  Differential includes, but is not limited to, viral syndrome, bronchitis including COPD exacerbation, pneumonia, reactive airway disease including asthma, CHF including exacerbation with or without pulmonary/interstitial edema, pneumothorax, ACS, thoracic trauma, and pulmonary embolism. As part of my medical decision making, I reviewed the following data within the electronic MEDICAL RECORD NUMBER Notes from prior ED visits  ----------------------------------------- 3:32 PM on 06/07/2018 -----------------------------------------  Patient at this time with lungs that are clear no longer with an expiratory cough.  Says that he thinks that this may be as a result of his chronic sinusitis.  Now complaining of right pain to his face over his maxillary sinus.  Patient is tender over the right maxillary sinus.  Patient says that the medication the last time he was here worked and that the symptoms returned soon after he stopped his medications.  I will start the patient on a home prednisone dose as well as doxycycline.  We will give the first dose of  dicyclomine in the emergency department he will also be referred to ear nose and throat for follow-up.  He is understanding the diagnosis as well as treatment and willing to comply.  Patient with what appears to be chronic shortness of breath associated with body aches and chest pain.  Reassuring EKG as well as troponin.  Similar symptoms earlier this month.  Visit reviewed.  No fever.  Unlikely to be flu. ____________________________________________   FINAL CLINICAL IMPRESSION(S) / ED DIAGNOSES  Sinusitis.  Shortness of breath.  Myalgia.  NEW MEDICATIONS STARTED DURING THIS VISIT:  New Prescriptions   No medications on file     Note:  This document was prepared using Dragon voice recognition software and may include unintentional dictation errors.     Orbie Pyo, MD 06/07/18 1536

## 2018-06-07 NOTE — ED Notes (Signed)
Pt states he has no way to get home. He lives in a brick house with intermittent power and his room mates are out working a job until Monday. He also has no way to fill his scripts. Pt provided with cab voucher and given information for [facility] with instructions to call them upon discharge.

## 2018-06-07 NOTE — ED Notes (Signed)
Pt states to this RN "I ain't going no where, I don't have anyone to come get me, you might want to go ahead and call my doctor and see if he wants to admit me". Dr. Clearnce Hasten made aware.

## 2018-06-10 ENCOUNTER — Telehealth: Payer: Self-pay | Admitting: Gastroenterology

## 2018-06-10 NOTE — Telephone Encounter (Signed)
Acceptable risk for procedure No meds to hold

## 2018-06-10 NOTE — Telephone Encounter (Signed)
Patient can't get through to central scheduling and needs help.

## 2018-06-11 NOTE — Telephone Encounter (Signed)
Routed to number provided via EPIC fax.  

## 2018-06-12 ENCOUNTER — Other Ambulatory Visit (HOSPITAL_COMMUNITY): Payer: Self-pay

## 2018-06-12 ENCOUNTER — Encounter (HOSPITAL_COMMUNITY): Payer: Self-pay

## 2018-06-12 NOTE — Progress Notes (Signed)
Bp: 160/78, pulse: 58, resp;18, spo2: 97  He states he has not been weighing, in process of moving and has his scales in storage.  He will have them back out and start weighing in another week.  He states been to ER past weekend for cold and cough.  He is on medications for same.  He states he is doing ok, just cant get over it.  He states been active and had a good appetite.  He states swelling has been down, he denies abdominal tightness.  He denies chest pain, headaches or dizziness.  He watches his diet as much as he can on his income.  He watches his fluid intake.  Meds verified and he has all his meds at the time. He takes meds out of bottles, he does not want them place din med box. Lungs clear. Pt has a cough, with phlegm green in color.  Will continue to visit weekly to educate on heart failure.   [place] [phone]

## 2018-06-13 ENCOUNTER — Other Ambulatory Visit: Payer: Self-pay

## 2018-06-13 DIAGNOSIS — Z1211 Encounter for screening for malignant neoplasm of colon: Secondary | ICD-10-CM

## 2018-06-13 DIAGNOSIS — K7460 Unspecified cirrhosis of liver: Secondary | ICD-10-CM

## 2018-06-13 DIAGNOSIS — Z13810 Encounter for screening for upper gastrointestinal disorder: Secondary | ICD-10-CM

## 2018-06-13 NOTE — Telephone Encounter (Signed)
Spoke with pt regarding request for help. Pt states he tried contacting the scheduling dept to schedule his MRCP but was unsuccessful. Pt also states he is ready to schedule his endoscopy procedure since  he now has a friend available to accompany him during and after the procedure. I have scheduled pt MRCP appointment, as well as the endoscopy procedure. Pt is aware of all appointment information, locations, and prior preps.

## 2018-06-14 ENCOUNTER — Ambulatory Visit: Payer: Medicare Other

## 2018-06-18 ENCOUNTER — Telehealth (HOSPITAL_COMMUNITY): Payer: Self-pay

## 2018-06-18 NOTE — Telephone Encounter (Signed)
[name] today is out at the church doing activities, missed appt today.  He advised doing well trying to keep his busy not to think about procedure on Friday.  He states has all his meds.  Will visit with him next week.   [place] [phone]

## 2018-06-20 ENCOUNTER — Ambulatory Visit: Admission: RE | Admit: 2018-06-20 | Payer: Medicare Other | Source: Ambulatory Visit | Admitting: Gastroenterology

## 2018-06-20 ENCOUNTER — Encounter: Admission: RE | Payer: Self-pay | Source: Ambulatory Visit

## 2018-06-20 ENCOUNTER — Telehealth: Payer: Self-pay

## 2018-06-20 SURGERY — COLONOSCOPY WITH PROPOFOL
Anesthesia: General

## 2018-06-20 NOTE — Telephone Encounter (Signed)
Patient contacted Trish to let her know he is not coming in for his procedure. He said he could not get anyone to bring him in to his procedure on such a short notice and he did not receive a call regarding the time of his procdure. He also said he tried to drink his bowel prep and got sick to his stomach.   I explained to [name] that his instructions clearly states that he should have called the Endoscopy Dept yesterday between the hours of 1pm-3pm to get his arrival time. And his instructions does state there is a cancellation fee if procedures are not canceled 48 business hours prior to procedure date.  I informed him also that there were no voice messages left indicating that he needed to cancel/ reschedule and there is a cancellation fee for procedures not cancelled within 48 hours of procedure date.  He does not agree with the $100 cancellation fee.  He does want to reschedule.  Please advise on rescheduling patients procedures and cancellation fee.  Thanks [employer]

## 2018-06-20 NOTE — Telephone Encounter (Signed)
Please inform - will let it pass this time but if canceled again or no show once more then will not be seen at the office as we are blocking a spot for other patients which could be used.

## 2018-06-23 ENCOUNTER — Emergency Department: Payer: Medicare Other

## 2018-06-23 ENCOUNTER — Other Ambulatory Visit: Payer: Self-pay

## 2018-06-23 ENCOUNTER — Telehealth: Payer: Self-pay

## 2018-06-23 ENCOUNTER — Emergency Department
Admission: EM | Admit: 2018-06-23 | Discharge: 2018-06-24 | Disposition: A | Discharge status: Home / Self Care | Payer: Medicare Other | Attending: Emergency Medicine | Admitting: Emergency Medicine

## 2018-06-23 ENCOUNTER — Encounter: Payer: Self-pay | Admitting: Emergency Medicine

## 2018-06-23 DIAGNOSIS — Y829 Unspecified medical devices associated with adverse incidents: Secondary | ICD-10-CM | POA: Insufficient documentation

## 2018-06-23 DIAGNOSIS — T502X5A Adverse effect of carbonic-anhydrase inhibitors, benzothiadiazides and other diuretics, initial encounter: Secondary | ICD-10-CM | POA: Insufficient documentation

## 2018-06-23 DIAGNOSIS — J157 Pneumonia due to Mycoplasma pneumoniae: Secondary | ICD-10-CM | POA: Diagnosis not present

## 2018-06-23 DIAGNOSIS — I5032 Chronic diastolic (congestive) heart failure: Secondary | ICD-10-CM | POA: Diagnosis not present

## 2018-06-23 DIAGNOSIS — J449 Chronic obstructive pulmonary disease, unspecified: Secondary | ICD-10-CM | POA: Insufficient documentation

## 2018-06-23 DIAGNOSIS — R197 Diarrhea, unspecified: Secondary | ICD-10-CM

## 2018-06-23 DIAGNOSIS — Z7984 Long term (current) use of oral hypoglycemic drugs: Secondary | ICD-10-CM | POA: Diagnosis not present

## 2018-06-23 DIAGNOSIS — T887XXA Unspecified adverse effect of drug or medicament, initial encounter: Secondary | ICD-10-CM | POA: Insufficient documentation

## 2018-06-23 DIAGNOSIS — E119 Type 2 diabetes mellitus without complications: Secondary | ICD-10-CM | POA: Insufficient documentation

## 2018-06-23 DIAGNOSIS — R0602 Shortness of breath: Secondary | ICD-10-CM | POA: Diagnosis present

## 2018-06-23 DIAGNOSIS — E876 Hypokalemia: Secondary | ICD-10-CM | POA: Diagnosis not present

## 2018-06-23 DIAGNOSIS — J324 Chronic pansinusitis: Secondary | ICD-10-CM

## 2018-06-23 DIAGNOSIS — Z79899 Other long term (current) drug therapy: Secondary | ICD-10-CM | POA: Insufficient documentation

## 2018-06-23 DIAGNOSIS — R1110 Vomiting, unspecified: Secondary | ICD-10-CM

## 2018-06-23 DIAGNOSIS — I110 Hypertensive heart disease with heart failure: Secondary | ICD-10-CM | POA: Insufficient documentation

## 2018-06-23 DIAGNOSIS — Z7982 Long term (current) use of aspirin: Secondary | ICD-10-CM | POA: Diagnosis not present

## 2018-06-23 LAB — COMPREHENSIVE METABOLIC PANEL
ALBUMIN: 3.1 g/dL — AB (ref 3.5–5.0)
ALT: 25 U/L (ref 0–44)
ANION GAP: 8 (ref 5–15)
AST: 40 U/L (ref 15–41)
Alkaline Phosphatase: 60 U/L (ref 38–126)
BUN: 16 mg/dL (ref 8–23)
CO2: 28 mmol/L (ref 22–32)
Calcium: 9 mg/dL (ref 8.9–10.3)
Chloride: 105 mmol/L (ref 98–111)
Creatinine, Ser: 1.16 mg/dL (ref 0.61–1.24)
GFR calc non Af Amer: >60 mL/min (ref >60)
GLUCOSE: 71 mg/dL (ref 70–99)
Potassium: 3.2 mmol/L — ABNORMAL LOW (ref 3.5–5.1)
SODIUM: 141 mmol/L (ref 135–145)
Total Bilirubin: 1.9 mg/dL — ABNORMAL HIGH (ref 0.3–1.2)
Total Protein: 6.7 g/dL (ref 6.5–8.1)

## 2018-06-23 LAB — BRAIN NATRIURETIC PEPTIDE: B Natriuretic Peptide: 62 pg/mL (ref 0.0–100.0)

## 2018-06-23 LAB — CBC WITH DIFFERENTIAL/PLATELET
Abs Immature Granulocytes: 0.01 10*3/uL (ref 0.00–0.07)
BASOS ABS: 0.1 10*3/uL (ref 0.0–0.1)
Basophils Relative: 1 %
Eosinophils Absolute: 0.1 10*3/uL (ref 0.0–0.5)
Eosinophils Relative: 3 %
HEMATOCRIT: 38.2 % — AB (ref 39.0–52.0)
HEMOGLOBIN: 12.3 g/dL — AB (ref 13.0–17.0)
IMMATURE GRANULOCYTES: 0 %
LYMPHS ABS: 1.4 10*3/uL (ref 0.7–4.0)
Lymphocytes Relative: 32 %
MCH: 27.9 pg (ref 26.0–34.0)
MCHC: 32.2 g/dL (ref 30.0–36.0)
MCV: 86.6 fL (ref 80.0–100.0)
MONOS PCT: 16 %
Monocytes Absolute: 0.7 10*3/uL (ref 0.1–1.0)
NEUTROS ABS: 2.1 10*3/uL (ref 1.7–7.7)
NEUTROS PCT: 48 %
NRBC: 0 % (ref 0.0–0.2)
Platelets: 110 10*3/uL — ABNORMAL LOW (ref 150–400)
RBC: 4.41 MIL/uL (ref 4.22–5.81)
RDW: 18.4 % — AB (ref 11.5–15.5)
WBC: 4.3 10*3/uL (ref 4.0–10.5)

## 2018-06-23 LAB — TROPONIN I: Troponin I: <0.03 ng/mL (ref <0.03)

## 2018-06-23 MED ORDER — SODIUM CHLORIDE 0.9 % IV BOLUS
1000.0000 mL | Freq: Once | INTRAVENOUS | Status: AC
  Administered 2018-06-23: 1000 mL via INTRAVENOUS

## 2018-06-23 NOTE — ED Triage Notes (Signed)
Pt in via EMS from the street with c/o sinus issues for a long time and has never had it fixed.

## 2018-06-23 NOTE — ED Provider Notes (Signed)
[facility] Emergency Department Provider Note  ____________________________________________  Time seen: Approximately 10:13 PM  I have reviewed the triage vital signs and the nursing notes.   HISTORY  Chief Complaint No chief complaint on file.    HPI [name] is a 67 y.o. male who presents the emergency department with multiple complaints.  Patient reports that he is having cough, shortness of breath, chest pain, weakness, nasal congestion, emesis and diarrhea.  Patient reports that he has chronic sinusitis which gives him difficulty on a regular basis.  Patient reports that symptoms have not changed in regards to his nasal congestion.  He reports a cough that is been going on for approximately a month.  He states that he has been seen twice for same, been placed on antibiotic with no relief.  Patient reports that cough is now productive, states that he will cough until "I almost black out."  Patient has not had a syncopal episode with coughing.  Patient reports diffuse chest pain that has been going on for "at least a week."  This chest pain does not radiate.  Patient reports it is a tight sensation.  No medications for this complaint in the last week.  Patient is also endorsing nausea, vomiting, diarrhea.  Patient reports that he was scheduled for an endoscopy and colonoscopy and after drinking the prep he has had considerable vomiting, diarrhea.  Colonoscopy was canceled due to emesis.  Patient reports that he has had no medication to correct emesis or diarrhea.  Patient reports that he is feeling weak given the amount of diarrhea and vomiting.  He states that he feels like he is dehydrated given his symptoms.  Patient denies any headache, visual changes, abdominal pain.    Patient does have a history of ongoing chest pain, as well as CHF, COPD, diabetes, sleep apnea.   Patient has multiple complaints incorporating chronic medical problems.  Patient reports that  all of his current complaints are worse than his baseline.   Past Medical History:  Diagnosis Date  . Asthma   . CHF (congestive heart failure) ([place])   . COPD (chronic obstructive pulmonary disease) ([place])   . Diabetes mellitus without complication ([place])   . Homelessness   . Hypertension   . Migraine   . Obesity   . Sleep apnea     Patient Active Problem List   Diagnosis Date Noted  . HTN (hypertension) 04/17/2018  . DM (diabetes mellitus) ([place]) 04/17/2018  . Lymphedema 04/17/2018  . Chronic diastolic CHF (congestive heart failure) ([place]) 04/07/2018  . Anemia 04/07/2018  . Bilateral leg pain 03/29/2018  . Chest pain 03/20/2018  . Adjustment disorder with mixed anxiety and depressed mood 12/16/2017  . Homelessness 12/16/2017    Past Surgical History:  Procedure Laterality Date  . CARDIAC CATHETERIZATION    . CHOLECYSTECTOMY    . EYE SURGERY    . INNER EAR SURGERY    . NOSE SURGERY      Prior to Admission medications   Medication Sig Start Date End Date Taking? Authorizing Provider  acetaminophen (TYLENOL) 650 MG CR tablet Take 1 tablet (650 mg total) by mouth 2 (two) times daily as needed for pain. 03/31/18   Gouru, Illene Silver, MD  albuterol (PROVENTIL HFA;VENTOLIN HFA) 108 (90 Base) MCG/ACT inhaler Inhale 1-2 puffs into the lungs every 6 (six) hours as needed for wheezing or shortness of breath. 03/31/18   Nicholes Mango, MD  aspirin EC 81 MG tablet Take 81 mg by  mouth daily.    [provider]  azithromycin (ZITHROMAX Z-PAK) 250 MG tablet Take 2 tablets (500 mg) on  Day 1,  followed by 1 tablet (250 mg) once daily on Days 2 through 5. 06/24/18   Cuthriell, Charline Bills, PA-C  beclomethasone (BECONASE-AQ) 42 MCG/SPRAY nasal spray Place 1 spray into both nostrils 2 (two) times daily. Dose is for each nostril.    [provider]  cephALEXin (KEFLEX) 500 MG capsule Take 1 capsule (500 mg total) by mouth 3 (three) times daily. 04/18/18   Menshew, Dannielle Karvonen, PA-C   cyclobenzaprine (FLEXERIL) 5 MG tablet Take 1 tablet (5 mg total) by mouth 3 (three) times daily as needed for muscle spasms. 04/18/18   Menshew, Dannielle Karvonen, PA-C  Dextromethorphan-guaiFENesin (WAL-TUSSIN COUGH/CHEST DM MAX) 10-200 MG/5ML LIQD Take 10 mLs by mouth every 4 (four) hours as needed.    [provider]  Dextrose, Diabetic Use, (DEXTROSE PO) Take by mouth.    [provider]  diphenhydrAMINE (BENADRYL) 25 MG tablet Take 50 mg by mouth every evening.     [provider]  doxycycline (VIBRAMYCIN) 100 MG capsule Take 1 capsule (100 mg total) by mouth 2 (two) times daily. 06/07/18   Orbie Pyo, MD  fluticasone (FLONASE) 50 MCG/ACT nasal spray Place 2 sprays into both nostrils 2 (two) times daily.    [provider]  furosemide (LASIX) 40 MG tablet Take 2 tablets (80 mg total) by mouth daily. 04/16/18   Alisa Graff, FNP  HYDROcodone-acetaminophen (NORCO/VICODIN) 5-325 MG tablet Take 1-2 tablets by mouth 3 (three) times daily as needed for up to 10 doses for moderate pain or severe pain. Patient not taking: Reported on 06/12/2018 03/31/18   Nicholes Mango, MD  lisinopril (PRINIVIL,ZESTRIL) 10 MG tablet Take 1 tablet (10 mg total) by mouth daily. 04/16/18   Alisa Graff, FNP  loperamide (LOPERAMIDE A-D) 2 MG tablet Take 4 mg by mouth as needed.     [provider]  meclizine (ANTIVERT) 25 MG tablet Take 25 mg by mouth 2 (two) times daily.     [provider]  metFORMIN (GLUCOPHAGE-XR) 500 MG 24 hr tablet Take 2 tablets (1,000 mg total) by mouth 2 (two) times daily. 03/31/18   Nicholes Mango, MD  Multiple Vitamin (MULTIVITAMIN WITH MINERALS) TABS tablet Take 1 tablet by mouth daily.    [provider]  omeprazole (PRILOSEC) 40 MG capsule Take 1 capsule (40 mg total) by mouth AC breakfast. 03/31/18   Gouru, Illene Silver, MD  ondansetron (ZOFRAN ODT) 4 MG disintegrating tablet Take 1 tablet (4 mg total) by mouth every 8 (eight)  hours as needed for nausea or vomiting. 05/29/18   Earleen Newport, MD  Potassium Chloride ER 20 MEQ TBCR Take 10 mEq by mouth daily. 06/24/18   Cuthriell, Charline Bills, PA-C  potassium chloride SA (K-DUR,KLOR-CON) 20 MEQ tablet Take 1 tablet (20 mEq total) by mouth daily. 04/16/18   Alisa Graff, FNP  predniSONE (DELTASONE) 20 MG tablet Take 2 tablets (40 mg total) by mouth daily. 06/07/18 06/07/19  Orbie Pyo, MD  promethazine (PHENERGAN) 25 MG tablet Take 25 mg by mouth every 6 (six) hours as needed for nausea or vomiting.    [provider]  simvastatin (ZOCOR) 40 MG tablet Take 1 tablet (40 mg total) by mouth daily at 6 PM. 04/16/18   Alisa Graff, FNP  tamsulosin (FLOMAX) 0.4 MG CAPS capsule Take 0.4 mg by mouth daily.  [provider]    Allergies Eggs or egg-derived products; Novocain [procaine]; and Sulfa antibiotics  Family History  Problem Relation Age of Onset  . Hypertension Mother   . Heart disease Mother   . Heart failure Maternal Grandmother     Social History Social History   Tobacco Use  . Smoking status: Never Smoker  . Smokeless tobacco: Never Used  Substance Use Topics  . Alcohol use: Never    Frequency: Never  . Drug use: Never     Review of Systems  Constitutional: No fever/chills Eyes: No visual changes. No discharge ENT: Positive for ongoing sinusitis with congestion, postnasal drip. Cardiovascular: Positive for diffuse chest pain.  No radiation of chest pain.  No palpitations. Respiratory: Positive for a month history of cough.  Positive SOB. Gastrointestinal: No abdominal pain.  Positive for nausea, vomiting, diarrhea..  No constipation. Genitourinary: Negative for dysuria. No hematuria Musculoskeletal: Negative for musculoskeletal pain. Skin: Negative for rash, abrasions, lacerations, ecchymosis. Neurological: Negative for headaches, focal weakness or numbness. 10-point ROS otherwise  negative.  ____________________________________________   PHYSICAL EXAM:  VITAL SIGNS: ED Triage Vitals  Enc Vitals Group     BP 06/23/18 2025 (!) 140/100     Pulse Rate 06/23/18 2025 61     Resp 06/23/18 2025 18     Temp 06/23/18 2025 98.6 F (37 C)     Temp Source 06/23/18 2025 Oral     SpO2 06/23/18 2025 95 %     Weight 06/23/18 2026 230 lb (104.3 kg)     Height 06/23/18 2026 5\' 6"  (1.676 m)     Head Circumference --      Peak Flow --      Pain Score 06/23/18 2026 0     Pain Loc --      Pain Edu? --      Excl. in [place]? --      Constitutional: Alert and oriented. Well appearing and in no acute distress. Eyes: Conjunctivae are normal. PERRL. EOMI. Head: Atraumatic. ENT:      Ears: EACs and TMs unremarkable bilaterally.      Nose: Mild clear congestion/rhinnorhea.  Patient reports diffuse, pansinus tenderness to percussion.      Mouth/Throat: Mucous membranes are moist.  Oropharynx is nonerythematous and nonedematous.  Uvula is midline. Neck: No stridor.  Neck is supple full range of motion Hematological/Lymphatic/Immunilogical: No cervical lymphadenopathy. Cardiovascular: Normal rate, regular rhythm. Normal S1 and S2.  No murmurs, rubs, gallops.  No apical heave.  Good peripheral circulation. Respiratory: Normal respiratory effort without tachypnea or retractions. Lungs bibasilar crackles.  No rales or rhonchi.  No wheezing.Kermit Balo air entry to the bases with no decreased or absent breath sounds. Gastrointestinal: Bowel sounds borderline hyperactive 4 quadrants. Soft and nontender to palpation all 4 quadrants. No guarding or rigidity. No palpable masses. No distention. No CVA tenderness. Musculoskeletal: Full range of motion to all extremities. No gross deformities appreciated. Neurologic:  Normal speech and language. No gross focal neurologic deficits are appreciated.  Skin:  Skin is warm, dry and intact. No rash noted. Psychiatric: Mood and affect are normal. Speech and  behavior are normal. Patient exhibits appropriate insight and judgement.   ____________________________________________   LABS (all labs ordered are listed, but only abnormal results are displayed)  Labs Reviewed  COMPREHENSIVE METABOLIC PANEL - Abnormal; Notable for the following components:      Result Value   Potassium 3.2 (*)    Albumin 3.1 (*)    Total  Bilirubin 1.9 (*)    All other components within normal limits  CBC WITH DIFFERENTIAL/PLATELET - Abnormal; Notable for the following components:   Hemoglobin 12.3 (*)    HCT 38.2 (*)    RDW 18.4 (*)    Platelets 110 (*)    All other components within normal limits  BRAIN NATRIURETIC PEPTIDE  TROPONIN I   ____________________________________________  EKG  ED ECG REPORT I, Charline Bills Cuthriell,  personally viewed and interpreted this ECG.   Date: 06/23/2018  EKG Time: 2249 hrs.  Rate: 51 bpm.  Rhythm: sinus bradycardia, patient with bradycardia at this rate present on previous EKGs  Axis: Normal axis  Intervals:none  ST&T Change: No ST elevations or depressions noted  Sinus bradycardia.  No STEMI.  PR, QRS, QT intervals within normal limits.  ____________________________________________  FBPZWCHEN I personally viewed and evaluated these images as part of my medical decision making, as well as reviewing the written report by the radiologist.  Dg Chest 2 View  Result Date: 06/23/2018 CLINICAL DATA:  Sinus drainage and cough EXAM: CHEST - 2 VIEW COMPARISON:  06/07/2018 FINDINGS: The heart size and mediastinal contours are within normal limits. Slight uncoiling of the thoracic aorta without aneurysm. No pulmonary consolidation or CHF. Both lungs are clear. Stable mild degenerative change about the [facility] and glenohumeral joints. Mild degenerative change of the thoracic spine. Surgical clips project over the right upper quadrant. No acute nor suspicious osseous abnormality. IMPRESSION: No active cardiopulmonary disease.  Electronically Signed   By: Ashley Royalty M.D.   On: 06/23/2018 23:34    ____________________________________________    PROCEDURES  Procedure(s) performed:    Procedures    Medications  potassium chloride SA (K-DUR,KLOR-CON) CR tablet 20 mEq (has no administration in time range)  azithromycin (ZITHROMAX) tablet 500 mg (has no administration in time range)  sodium chloride 0.9 % bolus 1,000 mL (1,000 mLs Intravenous New Bag/Given 06/23/18 2302)     ____________________________________________   INITIAL IMPRESSION / ASSESSMENT AND PLAN / ED COURSE  Pertinent labs & imaging results that were available during my care of the patient were reviewed by me and considered in my medical decision making (see chart for details).  Review of the [place] CSRS was performed in accordance of the [place] prior to dispensing any controlled drugs.  Clinical Course as of Jun 24 8  Mon Jun 23, 2018  2233 Patient presents the emergency department with multiple complaints.  Patient endorses nasal congestion, coughing, shortness of breath, chest pain, nausea, vomiting, diarrhea.  The symptoms have all been persistent, patient does have chronic medical conditions with similar symptoms.  Patient reports that all symptoms are worse than baseline.  Patient has been coughing intermittent evaluated x2 over the past month for similar symptoms.  Patient reports he is not improved.  Patient has chronic pansinusitis, does not have an ENT provider.  He has not taken any medications for this.  Patient is endorsing diffuse chest pain with no radiation.  Patient does have a history of CHF but no history of MI.  Patient is also endorsing cough, shortness of breath which is been progressive over the last month.  Nausea, vomiting, diarrhea after taking bowel prep for colonoscopy and endoscopy that was canceled.  Given patient's medical history, current complaints, he will be evaluated with EKG, chest x-ray, basic labs.   Differential includes worsening of chronic medical problems, sinusitis, bronchitis, pneumonia, MI, pericarditis, gastroenteritis, side effect of medication.   [JC]    Clinical Course User  Index [JC] Cuthriell, Charline Bills, PA-C     Patient's diagnosis is consistent with mycoplasma pneumonia, diuretic induced hypokalemia, nausea vomiting and diarrhea.  Patient presented to the emergency department with multiple complaints.  Patient has chronic sinusitis but was endorsing nasal congestion, he had a cough that has been persisting for 1 month, weakness, diffuse chest pain, nausea vomiting and diarrhea.  On exam, no acute findings.  Given patient's history, symptoms, patient was given IV fluids while awaiting labs and imaging.  Chest x-ray reveals no acute consolidation concerning for lobar pneumonia.  Given 1 month history of cough, patient will be treated empirically for mycoplasma pneumonia.  Patient has been complaining of nausea, vomiting, diarrhea since taking a bowel prep for colonoscopy.  This is ongoing.  No indication of diverticulitis, irritable bowel syndrome, Crohn's, viral gastroenteritis.  This is likely secondary to medication.  Patient is encouraged to drink plenty of fluids, discussed bland food items to help calm stomach.  Patient will also take a probiotic.  Patient does have mild hypokalemia, likely secondary to nausea vomiting diarrhea as well as chronic diuretic use.  As such, patient is given oral potassium in the emergency department and will be discharged with potassium supplements.  Patient is also aware of symptoms from hypokalemia.  He verbalizes that he can also drink Gatorade or eat a banana at home.  EKG, meeting labs are reassuring.  No indication for further work-up at this time.  Patient will be given first dose of antibiotic and first dose of potassium in the emergency department.  Discharged with Z-Pak and potassium for 5 days.  Follow-up with primary care. Patient is given ED  precautions to return to the ED for any worsening or new symptoms.     ____________________________________________  FINAL CLINICAL IMPRESSION(S) / ED DIAGNOSES  Final diagnoses:  Pneumonia due to Mycoplasma pneumoniae, unspecified laterality, unspecified part of lung  Diuretic-induced hypokalemia  Vomiting and diarrhea  Chronic pansinusitis      NEW MEDICATIONS STARTED DURING THIS VISIT:  ED Discharge Orders         Ordered    azithromycin (ZITHROMAX Z-PAK) 250 MG tablet     06/24/18 0005    potassium chloride 20 MEQ TBCR  Daily,   Status:  Discontinued     06/24/18 0005    Potassium Chloride ER 20 MEQ TBCR  Daily     06/24/18 0006              This chart was dictated using voice recognition software/Dragon. Despite best efforts to proofread, errors can occur which can change the meaning. Any change was purely unintentional.    Darletta Moll, PA-C 06/24/18 [place], [place], MD 06/27/18 [phone]

## 2018-06-23 NOTE — Telephone Encounter (Signed)
Contacted patient to inform him that Dr. Vicente Males said that we can waive the cancellation fee and reschedule him for his colonoscopy.  He said he has so many appts coming up he will call us back next Monday to schedule.  Thanks [employer]

## 2018-06-23 NOTE — ED Triage Notes (Signed)
Patient ambulatory to triage with steady gait, without difficulty or distress noted; pt reports sinus congestion "for a long time"

## 2018-06-24 ENCOUNTER — Telehealth (HOSPITAL_COMMUNITY): Payer: Self-pay

## 2018-06-24 DIAGNOSIS — J157 Pneumonia due to Mycoplasma pneumoniae: Secondary | ICD-10-CM | POA: Diagnosis not present

## 2018-06-24 MED ORDER — POTASSIUM CHLORIDE CRYS ER 20 MEQ PO TBCR
20.0000 meq | EXTENDED_RELEASE_TABLET | Freq: Once | ORAL | Status: AC
  Administered 2018-06-24: 20 meq via ORAL
  Filled 2018-06-24: qty 1

## 2018-06-24 MED ORDER — AZITHROMYCIN 250 MG PO TABS: ORAL_TABLET | ORAL | 0 refills | Status: DC

## 2018-06-24 MED ORDER — POTASSIUM CHLORIDE ER 20 MEQ PO TBCR: 10.0000 meq | EXTENDED_RELEASE_TABLET | Freq: Every day | ORAL | 0 refills | Status: DC

## 2018-06-24 MED ORDER — AZITHROMYCIN 500 MG PO TABS
500.0000 mg | ORAL_TABLET | Freq: Once | ORAL | Status: AC
  Administered 2018-06-24: 500 mg via ORAL
  Filled 2018-06-24: qty 1

## 2018-06-24 NOTE — Telephone Encounter (Signed)
Attempted to call and visit but cell phone not turned on.  Will continue to try to contact.   [place] [phone]

## 2018-06-25 ENCOUNTER — Telehealth (HOSPITAL_COMMUNITY): Payer: Self-pay

## 2018-06-25 NOTE — Telephone Encounter (Signed)
Contacted [place] to remind him of appt on Monday with Darylene Price, he was ranting about he may go to jail and he is deathly sick and judge does not care.  Something about a class and he is being evicted tomorrow and moving.  He asked if I will contact him early Monday morning to remind him.   [place] [phone]

## 2018-06-27 NOTE — Progress Notes (Signed)
Patient ID: [name], male    DOB: 12/07/1950, 67 y.o.   MRN: [id number]  HPI  [name] is a 67 y/o male with a history of DM, HTN, COPD, migraines, obstructive sleep apnea and chronic heart failure.  Echo report from 03/20/18 reviewed and showed an EF of 60-65% along with mild/moderate MR and a mildly elevated PA pressure of 42 mm Hg.   Was in the ED 06/23/18 due to pneumonia and hypokalemia. Antibiotics and potassium supplement was given and he was released. Was in the ED 06/07/18 due to shortness of breath and chest pain. Antibiotics, prednisone and ENT referral was place and he was released. Was in the ED 05/29/18 due to cough and shortness of breath. Steroids and antibiotics were given and he was released. Was in the ED 04/18/18 due to a mechanical fall. Neck and back xrays were negative. Antibiotic was given for foot blister and he was released. Admitted 03/29/18 due to acute pulmonary edema. Cardiology consult obtained. Initially needed IV lasix and then transitioned to oral diuretics. Net negative output of 3.3L. Discharged after 2 days. Admitted 03/19/18 due to chest pain. Cardiology consult obtained. Cardiac enzymes and stress test were negative. Medications adjusted and he was discharged after 2 days.   He presents today for a follow-up visit with a chief complaint of minimal shortness of breath upon moderate exertion. He describes this as chronic in nature having been present for several years. He does feel like his breathing has improved since his recent treatment of pneumonia. He has associated fatigue, cough, pedal edema, difficulty sleeping and chronic pain along with this. He denies any abdominal distention, palpitations, chest pain or dizziness. He's unable to weigh himself right now as he's currently homeless. Can not go to the homeless shelter due to criminal background.   Past Medical History:  Diagnosis Date  . Asthma   . CHF (congestive heart failure) ([place])   . COPD (chronic  obstructive pulmonary disease) ([place])   . Diabetes mellitus without complication ([place])   . Homelessness   . Hypertension   . Migraine   . Obesity   . Sleep apnea    Past Surgical History:  Procedure Laterality Date  . CARDIAC CATHETERIZATION    . CHOLECYSTECTOMY    . EYE SURGERY    . INNER EAR SURGERY    . NOSE SURGERY     Family History  Problem Relation Age of Onset  . Hypertension Mother   . Heart disease Mother   . Heart failure Maternal Grandmother    Social History   Tobacco Use  . Smoking status: Never Smoker  . Smokeless tobacco: Never Used  Substance Use Topics  . Alcohol use: Never    Frequency: Never   Allergies  Allergen Reactions  . Eggs Or Egg-Derived Products Nausea And Vomiting  . Novocain [Procaine]   . Sulfa Antibiotics    Prior to Admission medications   Medication Sig Start Date End Date Taking? Authorizing Provider  acetaminophen (TYLENOL) 650 MG CR tablet Take 1 tablet (650 mg total) by mouth 2 (two) times daily as needed for pain. 03/31/18  Yes Gouru, Illene Silver, MD  albuterol (PROVENTIL HFA;VENTOLIN HFA) 108 (90 Base) MCG/ACT inhaler Inhale 1-2 puffs into the lungs every 6 (six) hours as needed for wheezing or shortness of breath. 03/31/18  Yes Gouru, Illene Silver, MD  aspirin EC 81 MG tablet Take 81 mg by mouth daily.   Yes [provider]  beclomethasone (BECONASE-AQ) 42 MCG/SPRAY nasal  spray Place 1 spray into both nostrils 2 (two) times daily. Dose is for each nostril.   Yes [provider]  Dextromethorphan-guaiFENesin (WAL-TUSSIN COUGH/CHEST DM MAX) 10-200 MG/5ML LIQD Take 10 mLs by mouth every 4 (four) hours as needed.   Yes [provider]  Dextrose, Diabetic Use, (DEXTROSE PO) Take by mouth.   Yes [provider]  diphenhydrAMINE (BENADRYL) 25 MG tablet Take 50 mg by mouth every evening.    Yes [provider]  EPINEPHrine 0.3 MG/0.3ML SOSY Inject 0.3 mLs (0.3 mg total) into the muscle as needed. 06/30/18  Yes  Hackney, [name] A, FNP  furosemide (LASIX) 40 MG tablet Take 2 tablets (80 mg total) by mouth daily. 04/16/18  Yes Hackney, [name] A, FNP  lisinopril (PRINIVIL,ZESTRIL) 10 MG tablet Take 1 tablet (10 mg total) by mouth daily. 04/16/18  Yes Hackney, [name] A, FNP  loperamide (LOPERAMIDE A-D) 2 MG tablet Take 4 mg by mouth as needed.    Yes [provider]  meclizine (ANTIVERT) 25 MG tablet Take 25 mg by mouth 2 (two) times daily.    Yes [provider]  metFORMIN (GLUCOPHAGE-XR) 500 MG 24 hr tablet Take 2 tablets (1,000 mg total) by mouth 2 (two) times daily. 03/31/18  Yes Gouru, Illene Silver, MD  Multiple Vitamin (MULTIVITAMIN WITH MINERALS) TABS tablet Take 1 tablet by mouth daily.   Yes [provider]  omeprazole (PRILOSEC) 40 MG capsule Take 1 capsule (40 mg total) by mouth AC breakfast. 03/31/18  Yes Gouru, Aruna, MD  ondansetron (ZOFRAN ODT) 4 MG disintegrating tablet Take 1 tablet (4 mg total) by mouth every 8 (eight) hours as needed for nausea or vomiting. 05/29/18  Yes Earleen Newport, MD  potassium chloride SA (K-DUR,KLOR-CON) 20 MEQ tablet Take 1 tablet (20 mEq total) by mouth daily. 04/16/18  Yes Hackney, Aura Fey, FNP  promethazine (PHENERGAN) 25 MG tablet Take 25 mg by mouth every 6 (six) hours as needed for nausea or vomiting (before transportation).    Yes [provider]  simvastatin (ZOCOR) 40 MG tablet Take 1 tablet (40 mg total) by mouth daily at 6 PM. 04/16/18  Yes Darylene Price A, FNP  tamsulosin (FLOMAX) 0.4 MG CAPS capsule Take 0.4 mg by mouth daily.   Yes [provider]  Potassium Chloride ER 20 MEQ TBCR Take 10 mEq by mouth daily. Patient not taking: Reported on 06/30/2018 06/24/18   Cuthriell, Charline Bills, PA-C    Review of Systems  Constitutional: Positive for fatigue. Negative for appetite change.  HENT: Negative for congestion, postnasal drip and sore throat.   Eyes: Negative.   Respiratory: Positive for cough and shortness of breath  (improving).   Cardiovascular: Positive for leg swelling (improving). Negative for chest pain and palpitations.  Gastrointestinal: Negative for abdominal distention and abdominal pain.  Endocrine: Negative.   Genitourinary: Negative.   Musculoskeletal: Positive for back pain and neck pain.  Skin: Negative.   Allergic/Immunologic: Negative.   Neurological: Positive for headaches (migraines couple days of week). Negative for dizziness and light-headedness.  Hematological: Negative for adenopathy. Does not bruise/bleed easily.  Psychiatric/Behavioral: Positive for sleep disturbance (not sleeping well). Negative for dysphoric mood. The patient is not nervous/anxious.    Vitals:   06/30/18 0901  BP: (!) 146/69  Pulse: (!) 53  Resp: 16  Temp: 98 F (36.7 C)  TempSrc: Oral  SpO2: 99%  Weight: 238 lb 3.2 oz (108 kg)  Height: 5\' 6"  (1.676 m)   Wt Readings from  Last 3 Encounters:  06/30/18 238 lb 3.2 oz (108 kg)  06/23/18 230 lb (104.3 kg)  06/07/18 234 lb 9.6 oz (106.4 kg)   Lab Results  Component Value Date   CREATININE 1.16 06/23/2018   CREATININE 1.02 06/07/2018   CREATININE 1.11 05/29/2018    Physical Exam  Constitutional: He is oriented to person, place, and time. He appears well-developed and well-nourished.  HENT:  Head: Normocephalic and atraumatic.  Neck: Normal range of motion. Neck supple. No JVD present.  Cardiovascular: An irregular rhythm present. Bradycardia present.  Pulmonary/Chest: Effort normal. No respiratory distress. He has no wheezes. He has no rales.  Abdominal: Soft. He exhibits no distension.  Musculoskeletal: He exhibits edema (1+ pitting edema bilateral lower legs). He exhibits no tenderness.  Neurological: He is alert and oriented to person, place, and time.  Skin: Skin is warm and dry.  Psychiatric: He has a normal mood and affect. His behavior is normal.  Nursing note and vitals reviewed.   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 and he says that he will weigh himself on that but not consistently - weight unchanged from last visit 2 months ago - not adding salt to his food ; When he doesn't have a place to live, he tends to eat saltier foods - discussed the importance of closely following a 2000mg  sodium diet as much as he can - does walk ~ 3 miles daily - saw cardiology [name]) 04/07/18 - BNP 06/23/18 was 62.0 - participating in paramedicine program - PharmD reconciled medications with him - has not received his flu vaccine yet  2: HTN- - BP looks good today - saw PCP at [facility] 04/19/18; returns 07/21/18 - BMP 06/23/18 reviewed and showed sodium 141, potassium 3.2, creatinine 1.16 and GFR >60  3: DM- - nonfasting glucose in clinic today was 161 - A1c 03/20/18 was 5.5%  4: Lymphedema- - stage 2 - does walk for transportation/ uses [employer] for longer trips - encouraged him to wear compression socks daily with removal at bedtime - also encouraged him to elevate his legs  - could consider lymphapress compression pumps if his housing situation stabilizes  Medication bottles were reviewed.  Return in 6 weeks or sooner for any questions/problems before then.

## 2018-06-30 ENCOUNTER — Other Ambulatory Visit: Payer: Self-pay

## 2018-06-30 ENCOUNTER — Telehealth (HOSPITAL_COMMUNITY): Payer: Self-pay

## 2018-06-30 ENCOUNTER — Encounter: Payer: Self-pay | Admitting: Family

## 2018-06-30 ENCOUNTER — Ambulatory Visit: Payer: Medicare Other | Attending: Family | Admitting: Family

## 2018-06-30 VITALS — BP 146/69 | HR 53 | Temp 98.0°F | Resp 16 | Ht 66.0 in | Wt 238.2 lb

## 2018-06-30 DIAGNOSIS — G8929 Other chronic pain: Secondary | ICD-10-CM | POA: Insufficient documentation

## 2018-06-30 DIAGNOSIS — I110 Hypertensive heart disease with heart failure: Secondary | ICD-10-CM | POA: Diagnosis not present

## 2018-06-30 DIAGNOSIS — I5032 Chronic diastolic (congestive) heart failure: Secondary | ICD-10-CM | POA: Diagnosis not present

## 2018-06-30 DIAGNOSIS — G4733 Obstructive sleep apnea (adult) (pediatric): Secondary | ICD-10-CM | POA: Diagnosis not present

## 2018-06-30 DIAGNOSIS — Z6838 Body mass index (BMI) 38.0-38.9, adult: Secondary | ICD-10-CM | POA: Insufficient documentation

## 2018-06-30 DIAGNOSIS — Z7984 Long term (current) use of oral hypoglycemic drugs: Secondary | ICD-10-CM | POA: Insufficient documentation

## 2018-06-30 DIAGNOSIS — G43909 Migraine, unspecified, not intractable, without status migrainosus: Secondary | ICD-10-CM | POA: Diagnosis not present

## 2018-06-30 DIAGNOSIS — E876 Hypokalemia: Secondary | ICD-10-CM | POA: Insufficient documentation

## 2018-06-30 DIAGNOSIS — Z79899 Other long term (current) drug therapy: Secondary | ICD-10-CM | POA: Insufficient documentation

## 2018-06-30 DIAGNOSIS — J449 Chronic obstructive pulmonary disease, unspecified: Secondary | ICD-10-CM | POA: Diagnosis not present

## 2018-06-30 DIAGNOSIS — E669 Obesity, unspecified: Secondary | ICD-10-CM | POA: Insufficient documentation

## 2018-06-30 DIAGNOSIS — Z590 Homelessness: Secondary | ICD-10-CM | POA: Insufficient documentation

## 2018-06-30 DIAGNOSIS — E119 Type 2 diabetes mellitus without complications: Secondary | ICD-10-CM | POA: Insufficient documentation

## 2018-06-30 DIAGNOSIS — Z7982 Long term (current) use of aspirin: Secondary | ICD-10-CM | POA: Diagnosis not present

## 2018-06-30 DIAGNOSIS — Z882 Allergy status to sulfonamides status: Secondary | ICD-10-CM | POA: Diagnosis not present

## 2018-06-30 DIAGNOSIS — I890 Lymphedema, not elsewhere classified: Secondary | ICD-10-CM | POA: Diagnosis not present

## 2018-06-30 DIAGNOSIS — I10 Essential (primary) hypertension: Secondary | ICD-10-CM

## 2018-06-30 LAB — GLUCOSE, CAPILLARY: Glucose-Capillary: 161 mg/dL — ABNORMAL HIGH (ref 70–99)

## 2018-06-30 MED ORDER — EPINEPHRINE 0.3 MG/0.3ML IJ SOSY: 0.3000 mg | PREFILLED_SYRINGE | INTRAMUSCULAR | 5 refills | Status: DC | PRN

## 2018-06-30 NOTE — Patient Instructions (Signed)
Continue weighing daily and call for an overnight weight gain of > 2 pounds or a weekly weight gain of >5 pounds. 

## 2018-06-30 NOTE — Telephone Encounter (Signed)
Trashaun advised showed up 3 hours early for appt with [facility] and has already been seen.  He also wanted me to reschedule his scan for this Wednesday, advised him it was rescheduled and gave new date of Nov 21 to arrive at [place].  He repeated it back to me and appeared to understand.  Will continue to visit to educate on heart failure.   [place] [phone]

## 2018-07-02 ENCOUNTER — Ambulatory Visit: Payer: Medicare Other

## 2018-07-03 ENCOUNTER — Other Ambulatory Visit (HOSPITAL_COMMUNITY): Payer: Self-pay

## 2018-07-03 ENCOUNTER — Emergency Department: Payer: Medicare Other

## 2018-07-03 ENCOUNTER — Encounter (HOSPITAL_COMMUNITY): Payer: Self-pay

## 2018-07-03 ENCOUNTER — Emergency Department
Admission: EM | Admit: 2018-07-03 | Discharge: 2018-07-04 | Disposition: A | Discharge status: Home / Self Care | Payer: Medicare Other | Attending: Emergency Medicine | Admitting: Emergency Medicine

## 2018-07-03 ENCOUNTER — Other Ambulatory Visit: Payer: Self-pay

## 2018-07-03 DIAGNOSIS — J45909 Unspecified asthma, uncomplicated: Secondary | ICD-10-CM | POA: Insufficient documentation

## 2018-07-03 DIAGNOSIS — Z79899 Other long term (current) drug therapy: Secondary | ICD-10-CM | POA: Diagnosis not present

## 2018-07-03 DIAGNOSIS — J449 Chronic obstructive pulmonary disease, unspecified: Secondary | ICD-10-CM | POA: Diagnosis not present

## 2018-07-03 DIAGNOSIS — Z7984 Long term (current) use of oral hypoglycemic drugs: Secondary | ICD-10-CM | POA: Insufficient documentation

## 2018-07-03 DIAGNOSIS — I509 Heart failure, unspecified: Secondary | ICD-10-CM | POA: Insufficient documentation

## 2018-07-03 DIAGNOSIS — F4323 Adjustment disorder with mixed anxiety and depressed mood: Secondary | ICD-10-CM | POA: Insufficient documentation

## 2018-07-03 DIAGNOSIS — R079 Chest pain, unspecified: Secondary | ICD-10-CM | POA: Diagnosis present

## 2018-07-03 DIAGNOSIS — Z590 Homelessness: Secondary | ICD-10-CM | POA: Diagnosis not present

## 2018-07-03 DIAGNOSIS — E119 Type 2 diabetes mellitus without complications: Secondary | ICD-10-CM | POA: Insufficient documentation

## 2018-07-03 DIAGNOSIS — Z7982 Long term (current) use of aspirin: Secondary | ICD-10-CM | POA: Diagnosis not present

## 2018-07-03 DIAGNOSIS — Z9049 Acquired absence of other specified parts of digestive tract: Secondary | ICD-10-CM | POA: Diagnosis not present

## 2018-07-03 DIAGNOSIS — I110 Hypertensive heart disease with heart failure: Secondary | ICD-10-CM | POA: Diagnosis not present

## 2018-07-03 LAB — CBC WITH DIFFERENTIAL/PLATELET
ABS IMMATURE GRANULOCYTES: 0.01 10*3/uL (ref 0.00–0.07)
BASOS ABS: 0 10*3/uL (ref 0.0–0.1)
BASOS PCT: 1 %
Eosinophils Absolute: 0.2 10*3/uL (ref 0.0–0.5)
Eosinophils Relative: 5 %
HCT: 33.5 % — ABNORMAL LOW (ref 39.0–52.0)
Hemoglobin: 10.5 g/dL — ABNORMAL LOW (ref 13.0–17.0)
Immature Granulocytes: 0 %
Lymphocytes Relative: 32 %
Lymphs Abs: 1 10*3/uL (ref 0.7–4.0)
MCH: 27.9 pg (ref 26.0–34.0)
MCHC: 31.3 g/dL (ref 30.0–36.0)
MCV: 89.1 fL (ref 80.0–100.0)
Monocytes Absolute: 0.3 10*3/uL (ref 0.1–1.0)
Monocytes Relative: 10 %
NEUTROS ABS: 1.5 10*3/uL — AB (ref 1.7–7.7)
Neutrophils Relative %: 52 %
PLATELETS: 130 10*3/uL — AB (ref 150–400)
RBC: 3.76 MIL/uL — AB (ref 4.22–5.81)
RDW: 18.7 % — ABNORMAL HIGH (ref 11.5–15.5)
WBC: 3 10*3/uL — ABNORMAL LOW (ref 4.0–10.5)
nRBC: 0 % (ref 0.0–0.2)

## 2018-07-03 LAB — TROPONIN I: Troponin I: <0.03 ng/mL (ref <0.03)

## 2018-07-03 LAB — COMPREHENSIVE METABOLIC PANEL
ALT: 30 U/L (ref 0–44)
AST: 43 U/L — AB (ref 15–41)
Albumin: 2.8 g/dL — ABNORMAL LOW (ref 3.5–5.0)
Alkaline Phosphatase: 73 U/L (ref 38–126)
Anion gap: 4 — ABNORMAL LOW (ref 5–15)
BUN: 12 mg/dL (ref 8–23)
CHLORIDE: 111 mmol/L (ref 98–111)
CO2: 26 mmol/L (ref 22–32)
Calcium: 8.8 mg/dL — ABNORMAL LOW (ref 8.9–10.3)
Creatinine, Ser: 0.94 mg/dL (ref 0.61–1.24)
Glucose, Bld: 177 mg/dL — ABNORMAL HIGH (ref 70–99)
POTASSIUM: 3.6 mmol/L (ref 3.5–5.1)
Sodium: 141 mmol/L (ref 135–145)
Total Bilirubin: 1.1 mg/dL (ref 0.3–1.2)
Total Protein: 6.1 g/dL — ABNORMAL LOW (ref 6.5–8.1)

## 2018-07-03 LAB — ETHANOL

## 2018-07-03 MED ORDER — FUROSEMIDE 10 MG/ML IJ SOLN
40.0000 mg | Freq: Once | INTRAMUSCULAR | Status: AC
  Administered 2018-07-03: 40 mg via INTRAVENOUS
  Filled 2018-07-03: qty 4

## 2018-07-03 MED ORDER — MORPHINE SULFATE (PF) 4 MG/ML IV SOLN
4.0000 mg | Freq: Once | INTRAVENOUS | Status: AC
  Administered 2018-07-03: 4 mg via INTRAVENOUS
  Filled 2018-07-03: qty 1

## 2018-07-03 MED ORDER — ASPIRIN 81 MG PO CHEW
324.0000 mg | CHEWABLE_TABLET | Freq: Once | ORAL | Status: AC
  Administered 2018-07-03: 324 mg via ORAL
  Filled 2018-07-03: qty 4

## 2018-07-03 MED ORDER — ONDANSETRON HCL 4 MG/2ML IJ SOLN
4.0000 mg | Freq: Once | INTRAMUSCULAR | Status: AC
  Administered 2018-07-03: 4 mg via INTRAVENOUS
  Filled 2018-07-03: qty 2

## 2018-07-03 NOTE — ED Notes (Signed)
Lab called to confirm all necessary tubes were sent.

## 2018-07-03 NOTE — ED Triage Notes (Signed)
Pt brought in via EMS for SOB. Pt is homeless. EMS states O2 sat 98% RA and RR 22. BP 136/72, NSR, HR 86 per EMS. Pt now states CP and back pain.

## 2018-07-03 NOTE — ED Notes (Signed)
EKG completed and given to EDP Siadecki in person.

## 2018-07-03 NOTE — ED Provider Notes (Addendum)
[facility] Emergency Department Provider Note   ____________________________________________   First MD Initiated Contact with Patient 07/03/18 2302     (approximate)  I have reviewed the triage vital signs and the nursing notes.   HISTORY  Chief Complaint Chest Pain   HPI [name] is a 67 y.o. male brought to the ED via EMS with a chief complaint of chest pain and shortness of breath.  Patient has a history of hypertension, diabetes, COPD, CHF.  Reports chest pain since noon, exacerbated with exertion.  States he is homeless and the police evicted him from where he was staying.  Denies associated nausea/vomiting, palpitations or dizziness.  Reports dry cough persistent from pneumonia several weeks ago.   Past Medical History:  Diagnosis Date  . Asthma   . CHF (congestive heart failure) ([place])   . COPD (chronic obstructive pulmonary disease) ([place])   . Diabetes mellitus without complication ([place])   . Homelessness   . Hypertension   . Migraine   . Obesity   . Sleep apnea     Patient Active Problem List   Diagnosis Date Noted  . HTN (hypertension) 04/17/2018  . DM (diabetes mellitus) ([place]) 04/17/2018  . Lymphedema 04/17/2018  . Chronic diastolic CHF (congestive heart failure) ([place]) 04/07/2018  . Anemia 04/07/2018  . Bilateral leg pain 03/29/2018  . Chest pain 03/20/2018  . Adjustment disorder with mixed anxiety and depressed mood 12/16/2017  . Homelessness 12/16/2017    Past Surgical History:  Procedure Laterality Date  . CARDIAC CATHETERIZATION    . CHOLECYSTECTOMY    . EYE SURGERY    . INNER EAR SURGERY    . NOSE SURGERY      Prior to Admission medications   Medication Sig Start Date End Date Taking? Authorizing Provider  acetaminophen (TYLENOL) 650 MG CR tablet Take 1 tablet (650 mg total) by mouth 2 (two) times daily as needed for pain. 03/31/18   Gouru, Illene Silver, MD  albuterol (PROVENTIL HFA;VENTOLIN HFA) 108 (90 Base) MCG/ACT  inhaler Inhale 1-2 puffs into the lungs every 6 (six) hours as needed for wheezing or shortness of breath. 03/31/18   Nicholes Mango, MD  aspirin EC 81 MG tablet Take 81 mg by mouth daily.    [provider]  beclomethasone (BECONASE-AQ) 42 MCG/SPRAY nasal spray Place 1 spray into both nostrils 2 (two) times daily. Dose is for each nostril.    [provider]  Dextromethorphan-guaiFENesin (WAL-TUSSIN COUGH/CHEST DM MAX) 10-200 MG/5ML LIQD Take 10 mLs by mouth every 4 (four) hours as needed.    [provider]  Dextrose, Diabetic Use, (DEXTROSE PO) Take by mouth.    [provider]  diphenhydrAMINE (BENADRYL) 25 MG tablet Take 50 mg by mouth every evening.     [provider]  EPINEPHrine 0.3 MG/0.3ML SOSY Inject 0.3 mLs (0.3 mg total) into the muscle as needed. 06/30/18   Alisa Graff, FNP  furosemide (LASIX) 40 MG tablet Take 2 tablets (80 mg total) by mouth daily. 04/16/18   Alisa Graff, FNP  lisinopril (PRINIVIL,ZESTRIL) 10 MG tablet Take 1 tablet (10 mg total) by mouth daily. 04/16/18   Alisa Graff, FNP  loperamide (LOPERAMIDE A-D) 2 MG tablet Take 4 mg by mouth as needed.     [provider]  meclizine (ANTIVERT) 25 MG tablet Take 25 mg by mouth 2 (two) times daily.     [provider]  metFORMIN (GLUCOPHAGE-XR) 500 MG 24 hr tablet Take  2 tablets (1,000 mg total) by mouth 2 (two) times daily. 03/31/18   Nicholes Mango, MD  Multiple Vitamin (MULTIVITAMIN WITH MINERALS) TABS tablet Take 1 tablet by mouth daily.    [provider]  omeprazole (PRILOSEC) 40 MG capsule Take 1 capsule (40 mg total) by mouth AC breakfast. 03/31/18   Gouru, Illene Silver, MD  ondansetron (ZOFRAN ODT) 4 MG disintegrating tablet Take 1 tablet (4 mg total) by mouth every 8 (eight) hours as needed for nausea or vomiting. 05/29/18   Earleen Newport, MD  Potassium Chloride ER 20 MEQ TBCR Take 10 mEq by mouth daily. Patient not taking: Reported on 07/03/2018  06/24/18   Cuthriell, Charline Bills, PA-C  potassium chloride SA (K-DUR,KLOR-CON) 20 MEQ tablet Take 1 tablet (20 mEq total) by mouth daily. 04/16/18   Alisa Graff, FNP  promethazine (PHENERGAN) 25 MG tablet Take 25 mg by mouth every 6 (six) hours as needed for nausea or vomiting (before transportation).     [provider]  simvastatin (ZOCOR) 40 MG tablet Take 1 tablet (40 mg total) by mouth daily at 6 PM. 04/16/18   Alisa Graff, FNP  tamsulosin (FLOMAX) 0.4 MG CAPS capsule Take 0.4 mg by mouth daily.    [provider]    Allergies Eggs or egg-derived products; Novocain [procaine]; and Sulfa antibiotics  Family History  Problem Relation Age of Onset  . Hypertension Mother   . Heart disease Mother   . Heart failure Maternal Grandmother     Social History Social History   Tobacco Use  . Smoking status: Never Smoker  . Smokeless tobacco: Never Used  Substance Use Topics  . Alcohol use: Never    Frequency: Never  . Drug use: Never    Review of Systems  Constitutional: No fever/chills Eyes: No visual changes. ENT: No sore throat. Cardiovascular: Positive for chest pain. Respiratory: Positive for shortness of breath. Gastrointestinal: No abdominal pain.  No nausea, no vomiting.  No diarrhea.  No constipation. Genitourinary: Negative for dysuria. Musculoskeletal: Negative for back pain. Skin: Negative for rash. Neurological: Negative for headaches, focal weakness or numbness.   ____________________________________________   PHYSICAL EXAM:  VITAL SIGNS: ED Triage Vitals  Enc Vitals Group     BP      Pulse      Resp      Temp      Temp src      SpO2      Weight      Height      Head Circumference      Peak Flow      Pain Score      Pain Loc      Pain Edu?      Excl. in [place]?     Constitutional: Alert and oriented.  Disheveled appearing and in no acute distress. Eyes: Conjunctivae are normal. PERRL. EOMI. Head: Atraumatic. Nose: No  congestion/rhinnorhea. Mouth/Throat: Mucous membranes are moist.  Oropharynx non-erythematous. Neck: No stridor.   Cardiovascular: Normal rate, regular rhythm. Grossly normal heart sounds.  Good peripheral circulation. Respiratory: Normal respiratory effort.  No retractions. Lungs with faint bibasilar rales. Gastrointestinal: Soft and nontender. No distention. No abdominal bruits. No CVA tenderness. Musculoskeletal: No lower extremity tenderness.  1+ BLE pitting edema.  No joint effusions. Neurologic:  Normal speech and language. No gross focal neurologic deficits are appreciated. No gait instability. Skin:  Skin is warm, dry and intact. No rash noted. Psychiatric: Mood and affect are normal. Speech and behavior  are normal.  ____________________________________________   LABS (all labs ordered are listed, but only abnormal results are displayed)  Labs Reviewed  CBC WITH DIFFERENTIAL/PLATELET - Abnormal; Notable for the following components:      Result Value   WBC 3.0 (*)    RBC 3.76 (*)    Hemoglobin 10.5 (*)    HCT 33.5 (*)    RDW 18.7 (*)    Platelets 130 (*)    Neutro Abs 1.5 (*)    All other components within normal limits  COMPREHENSIVE METABOLIC PANEL - Abnormal; Notable for the following components:   Glucose, Bld 177 (*)    Calcium 8.8 (*)    Total Protein 6.1 (*)    Albumin 2.8 (*)    AST 43 (*)    Anion gap 4 (*)    All other components within normal limits  BRAIN NATRIURETIC PEPTIDE - Abnormal; Notable for the following components:   B Natriuretic Peptide 242.0 (*)    All other components within normal limits  ETHANOL  TROPONIN I  TROPONIN I   ____________________________________________  EKG  ED ECG REPORT I, Keven Soucy J, the attending physician, personally viewed and interpreted this ECG.   Date: 07/03/2018  EKG Time: 2300  Rate: 63  Rhythm: normal EKG, normal sinus rhythm  Axis: Normal  Intervals:none  ST&T Change:  Nonspecific  ____________________________________________  RADIOLOGY  ED MD interpretation: No acute cardiopulmonary process  Official radiology report(s): Dg Chest 2 View  Result Date: 07/04/2018 CLINICAL DATA:  Shortness of breath EXAM: CHEST - 2 VIEW COMPARISON:  06/23/2018 FINDINGS: No focal opacity or pleural effusion. Borderline cardiomegaly. No pneumothorax. Mild degenerative change of the spine. IMPRESSION: No active cardiopulmonary disease.  Borderline cardiomegaly Electronically Signed   By: Donavan Foil M.D.   On: 07/04/2018 00:00    ____________________________________________   PROCEDURES  Procedure(s) performed: None  Procedures  Critical Care performed: No  ____________________________________________   INITIAL IMPRESSION / ASSESSMENT AND PLAN / ED COURSE  As part of my medical decision making, I reviewed the following data within the [facility] notes reviewed and incorporated, Labs reviewed, EKG interpreted, Old chart reviewed, Radiograph reviewed and Notes from prior ED visits   67 year old male with CHF, COPD, hypertension, diabetes who presents with chest pain. Differential diagnosis includes, but is not limited to, ACS, aortic dissection, pulmonary embolism, cardiac tamponade, pneumothorax, pneumonia, pericarditis, myocarditis, GI-related causes including esophagitis/gastritis, and musculoskeletal chest wall pain.    Will obtain screening cardiac panel, initiate aspirin therapy, 4 mg IV morphine, 40 mg IV Lasix for diuresis.  Will plan to repeat timed troponin and reassess.  Clinical Course as of Jul 06 555  Fri Jul 04, 2018  0035 Pain-free.  Resting in no acute distress.   [JS]  0237 Repeat troponin remains unremarkable.  Patient is resting in no acute distress.  Since he is homeless, will board patient in the ED until morning when he can take the bus.  Strict return precautions given.  Patient verbalizes understanding and  agrees with plan of care.   [JS]  [id number] Patient to go on the bus in 30 minutes.  Complains of typical migraine headache.  Will administer Fioricet.   [JS]    Clinical Course User Index [JS] Paulette Blanch, MD     ____________________________________________   FINAL CLINICAL IMPRESSION(S) / ED DIAGNOSES  Final diagnoses:  Chest pain, unspecified type  Acute on chronic congestive heart failure, unspecified heart failure type ([place])  ED Discharge Orders    None       Note:  This document was prepared using Dragon voice recognition software and may include unintentional dictation errors.    Paulette Blanch, MD 07/04/18 1219    Paulette Blanch, MD 07/05/18 [phone]

## 2018-07-03 NOTE — Progress Notes (Signed)
232 lbs today bp: 160/80, pulse: 60, resp: 18, spo2: 98%  Using a different scale but will start weighing everyday.  Appetite good, eating well.  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  He states feeling better, getting over his pneumonia.  Legs have plus 1 edema in them.  He is up on his feet a lot, does a lot of walking getting around.  Tries to watch  Sodium intake, hard on low income.  Stays very active.  Has no complaints today as far as feeling bad.  States cough is much better.  Denies headaches, shortness of breath, dizziness or chest pain.  He did state had chest pain yesterday for about 30 seconds, went away, took no nitro relieved on its own.  Lungs clear.  Will continue to visit to make sure has meds and educate on heart failure.   [place] [phone]

## 2018-07-03 NOTE — ED Notes (Signed)
Pt leaving for x-ray.  ?

## 2018-07-04 DIAGNOSIS — I509 Heart failure, unspecified: Secondary | ICD-10-CM | POA: Diagnosis not present

## 2018-07-04 LAB — BRAIN NATRIURETIC PEPTIDE: B NATRIURETIC PEPTIDE 5: 242 pg/mL — AB (ref 0.0–100.0)

## 2018-07-04 LAB — TROPONIN I: Troponin I: <0.03 ng/mL (ref <0.03)

## 2018-07-04 MED ORDER — BUTALBITAL-APAP-CAFFEINE 50-325-40 MG PO TABS
1.0000 | ORAL_TABLET | Freq: Once | ORAL | Status: AC
  Administered 2018-07-04: 1 via ORAL
  Filled 2018-07-04: qty 1

## 2018-07-04 NOTE — Discharge Instructions (Addendum)
Continue your medicines daily as directed by your doctor.  Return to the ER for worsening symptoms, persistent vomiting, difficult to breathing or other concerns.

## 2018-07-04 NOTE — ED Notes (Signed)
Pt resting quietly in bed. No complaints or concerns at this time. Will continue to assess.

## 2018-07-04 NOTE — ED Notes (Signed)
Pt resting. Denies discomfort.

## 2018-07-04 NOTE — ED Notes (Signed)
Pt given diet drink and snack.

## 2018-07-04 NOTE — ED Notes (Signed)
Pt verbalizes understanding of d/c instructions and follow up. 

## 2018-07-09 ENCOUNTER — Emergency Department: Payer: Medicare Other

## 2018-07-09 ENCOUNTER — Emergency Department
Admission: EM | Admit: 2018-07-09 | Discharge: 2018-07-09 | Disposition: A | Discharge status: Home / Self Care | Payer: Medicare Other | Attending: Emergency Medicine | Admitting: Emergency Medicine

## 2018-07-09 ENCOUNTER — Encounter: Payer: Self-pay | Admitting: Emergency Medicine

## 2018-07-09 DIAGNOSIS — Z79899 Other long term (current) drug therapy: Secondary | ICD-10-CM | POA: Insufficient documentation

## 2018-07-09 DIAGNOSIS — Z9049 Acquired absence of other specified parts of digestive tract: Secondary | ICD-10-CM | POA: Diagnosis not present

## 2018-07-09 DIAGNOSIS — J40 Bronchitis, not specified as acute or chronic: Secondary | ICD-10-CM | POA: Diagnosis not present

## 2018-07-09 DIAGNOSIS — Z590 Homelessness: Secondary | ICD-10-CM | POA: Diagnosis not present

## 2018-07-09 DIAGNOSIS — M47816 Spondylosis without myelopathy or radiculopathy, lumbar region: Secondary | ICD-10-CM

## 2018-07-09 DIAGNOSIS — M479 Spondylosis, unspecified: Secondary | ICD-10-CM | POA: Insufficient documentation

## 2018-07-09 DIAGNOSIS — J449 Chronic obstructive pulmonary disease, unspecified: Secondary | ICD-10-CM | POA: Diagnosis not present

## 2018-07-09 DIAGNOSIS — I110 Hypertensive heart disease with heart failure: Secondary | ICD-10-CM | POA: Diagnosis not present

## 2018-07-09 DIAGNOSIS — Z7984 Long term (current) use of oral hypoglycemic drugs: Secondary | ICD-10-CM | POA: Insufficient documentation

## 2018-07-09 DIAGNOSIS — Z7982 Long term (current) use of aspirin: Secondary | ICD-10-CM | POA: Insufficient documentation

## 2018-07-09 DIAGNOSIS — E119 Type 2 diabetes mellitus without complications: Secondary | ICD-10-CM | POA: Insufficient documentation

## 2018-07-09 DIAGNOSIS — R0602 Shortness of breath: Secondary | ICD-10-CM | POA: Diagnosis present

## 2018-07-09 DIAGNOSIS — I5032 Chronic diastolic (congestive) heart failure: Secondary | ICD-10-CM | POA: Insufficient documentation

## 2018-07-09 LAB — URINALYSIS, COMPLETE (UACMP) WITH MICROSCOPIC
BILIRUBIN URINE: NEGATIVE
Bacteria, UA: NONE SEEN
GLUCOSE, UA: NEGATIVE mg/dL
Hgb urine dipstick: NEGATIVE
KETONES UR: 5 mg/dL — AB
Leukocytes, UA: NEGATIVE
NITRITE: NEGATIVE
PH: 6 (ref 5.0–8.0)
Protein, ur: 30 mg/dL — AB
Specific Gravity, Urine: 1.02 (ref 1.005–1.030)
Squamous Epithelial / LPF: NONE SEEN (ref 0–5)

## 2018-07-09 LAB — CBC
HCT: 33.7 % — ABNORMAL LOW (ref 39.0–52.0)
HEMOGLOBIN: 10.7 g/dL — AB (ref 13.0–17.0)
MCH: 28.4 pg (ref 26.0–34.0)
MCHC: 31.8 g/dL (ref 30.0–36.0)
MCV: 89.4 fL (ref 80.0–100.0)
Platelets: 130 10*3/uL — ABNORMAL LOW (ref 150–400)
RBC: 3.77 MIL/uL — ABNORMAL LOW (ref 4.22–5.81)
RDW: 18.7 % — ABNORMAL HIGH (ref 11.5–15.5)
WBC: 3.1 10*3/uL — AB (ref 4.0–10.5)
nRBC: 0 % (ref 0.0–0.2)

## 2018-07-09 LAB — BRAIN NATRIURETIC PEPTIDE: B Natriuretic Peptide: 323 pg/mL — ABNORMAL HIGH (ref 0.0–100.0)

## 2018-07-09 LAB — BASIC METABOLIC PANEL
Anion gap: 6 (ref 5–15)
BUN: 13 mg/dL (ref 8–23)
CHLORIDE: 108 mmol/L (ref 98–111)
CO2: 27 mmol/L (ref 22–32)
CREATININE: 1.02 mg/dL (ref 0.61–1.24)
Calcium: 8.9 mg/dL (ref 8.9–10.3)
GFR calc Af Amer: >60 mL/min (ref >60)
GFR calc non Af Amer: >60 mL/min (ref >60)
Glucose, Bld: 116 mg/dL — ABNORMAL HIGH (ref 70–99)
Potassium: 3.8 mmol/L (ref 3.5–5.1)
SODIUM: 141 mmol/L (ref 135–145)

## 2018-07-09 LAB — INFLUENZA PANEL BY PCR (TYPE A & B)
Influenza A By PCR: NEGATIVE
Influenza B By PCR: NEGATIVE

## 2018-07-09 LAB — TROPONIN I: Troponin I: <0.03 ng/mL (ref <0.03)

## 2018-07-09 MED ORDER — ACETAMINOPHEN 500 MG PO TABS
1000.0000 mg | ORAL_TABLET | Freq: Once | ORAL | Status: AC
  Administered 2018-07-09: 1000 mg via ORAL
  Filled 2018-07-09: qty 2

## 2018-07-09 MED ORDER — PREDNISONE 20 MG PO TABS
60.0000 mg | ORAL_TABLET | Freq: Once | ORAL | Status: AC
  Administered 2018-07-09: 60 mg via ORAL
  Filled 2018-07-09: qty 3

## 2018-07-09 MED ORDER — PREDNISONE 20 MG PO TABS: 60.0000 mg | ORAL_TABLET | Freq: Every day | ORAL | 0 refills | Status: AC

## 2018-07-09 MED ORDER — SODIUM CHLORIDE 0.9 % IV BOLUS
500.0000 mL | Freq: Once | INTRAVENOUS | Status: AC
  Administered 2018-07-09: 500 mL via INTRAVENOUS

## 2018-07-09 MED ORDER — ONDANSETRON HCL 4 MG/2ML IJ SOLN
4.0000 mg | Freq: Once | INTRAMUSCULAR | Status: AC
  Administered 2018-07-09: 4 mg via INTRAVENOUS
  Filled 2018-07-09: qty 2

## 2018-07-09 MED ORDER — ALBUTEROL SULFATE HFA 108 (90 BASE) MCG/ACT IN AERS: 2.0000 | INHALATION_SPRAY | Freq: Four times a day (QID) | RESPIRATORY_TRACT | 2 refills | Status: DC | PRN

## 2018-07-09 MED ORDER — IPRATROPIUM-ALBUTEROL 0.5-2.5 (3) MG/3ML IN SOLN
3.0000 mL | Freq: Once | RESPIRATORY_TRACT | Status: AC
  Administered 2018-07-09: 3 mL via RESPIRATORY_TRACT
  Filled 2018-07-09: qty 3

## 2018-07-09 MED ORDER — DOXYCYCLINE HYCLATE 100 MG PO CAPS: 100.0000 mg | ORAL_CAPSULE | Freq: Two times a day (BID) | ORAL | 0 refills | Status: AC

## 2018-07-09 NOTE — ED Triage Notes (Signed)
Pt to ED with c/o of chest pain that started yesterday. Pt states N/V. Pt describes it as pressure across both sides of chest.

## 2018-07-09 NOTE — ED Notes (Signed)
Patient transported to X-ray at this time 

## 2018-07-09 NOTE — ED Provider Notes (Signed)
[facility] Emergency Department Provider Note  ____________________________________________  Time seen: Approximately 6:03 PM  I have reviewed the triage vital signs and the nursing notes.   HISTORY  Chief Complaint Chest Pain   HPI [name] is a 67 y.o. male with a history of CHF with preserved EF, COPD, diabetes, hypertension, sleep apnea, homelessness, CAD who presents for evaluation of shortness of breath.patient was recently treated for bronchitis 3 weeks ago with prednisone and Z-Pak. He reports that he improved however yesterday started feeling worse again. Now has had several daily episodes of nonbloody nonbilious emesis, 5 episodes of watery diarrhea. His cough is persistent and he is bringing up green and yellow sputum. He is not sure if he has had a fever but he has had chills. No abdominal pain. He describes chest tightness constant since yesterday but no pain. Has used inhaler twice today with some relief. He does endorse shortness of breath that is present with exertion but resolves at rest. No prior history of PE or DVT, no leg swelling, no hemoptysis.  patient is also complaining of 2 days of low back pain that he describes as pulling, diffuse in his lumbar region, constant and nonradiating. Worse with movement. No saddle anesthesia, no bowel or bladder loss or retention, no weakness or numbness of his lower extremities. No trauma. No unintentional weight loss, no night sweats.  Past Medical History:  Diagnosis Date  . Asthma   . CHF (congestive heart failure) ([place])   . COPD (chronic obstructive pulmonary disease) ([place])   . Diabetes mellitus without complication ([place])   . Homelessness   . Hypertension   . Migraine   . Obesity   . Sleep apnea     Patient Active Problem List   Diagnosis Date Noted  . HTN (hypertension) 04/17/2018  . DM (diabetes mellitus) ([place]) 04/17/2018  . Lymphedema 04/17/2018  . Chronic diastolic CHF  (congestive heart failure) ([place]) 04/07/2018  . Anemia 04/07/2018  . Bilateral leg pain 03/29/2018  . Chest pain 03/20/2018  . Adjustment disorder with mixed anxiety and depressed mood 12/16/2017  . Homelessness 12/16/2017    Past Surgical History:  Procedure Laterality Date  . CARDIAC CATHETERIZATION    . CHOLECYSTECTOMY    . EYE SURGERY    . INNER EAR SURGERY    . NOSE SURGERY      Prior to Admission medications   Medication Sig Start Date End Date Taking? Authorizing Provider  acetaminophen (TYLENOL) 650 MG CR tablet Take 1 tablet (650 mg total) by mouth 2 (two) times daily as needed for pain. 03/31/18   Gouru, Illene Silver, MD  albuterol (PROVENTIL HFA;VENTOLIN HFA) 108 (90 Base) MCG/ACT inhaler Inhale 2 puffs into the lungs every 6 (six) hours as needed for wheezing or shortness of breath. 07/09/18   Rudene Re, MD  aspirin EC 81 MG tablet Take 81 mg by mouth daily.    [provider]  beclomethasone (BECONASE-AQ) 42 MCG/SPRAY nasal spray Place 1 spray into both nostrils 2 (two) times daily. Dose is for each nostril.    [provider]  Dextromethorphan-guaiFENesin (WAL-TUSSIN COUGH/CHEST DM MAX) 10-200 MG/5ML LIQD Take 10 mLs by mouth every 4 (four) hours as needed.    [provider]  Dextrose, Diabetic Use, (DEXTROSE PO) Take by mouth.    [provider]  diphenhydrAMINE (BENADRYL) 25 MG tablet Take 50 mg by mouth every evening.     [provider]  doxycycline (VIBRAMYCIN) 100 MG capsule  Take 1 capsule (100 mg total) by mouth 2 (two) times daily for 7 days. 07/09/18 07/16/18  Rudene Re, MD  EPINEPHrine 0.3 MG/0.3ML SOSY Inject 0.3 mLs (0.3 mg total) into the muscle as needed. 06/30/18   Alisa Graff, FNP  furosemide (LASIX) 40 MG tablet Take 2 tablets (80 mg total) by mouth daily. 04/16/18   Alisa Graff, FNP  lisinopril (PRINIVIL,ZESTRIL) 10 MG tablet Take 1 tablet (10 mg total) by mouth daily. 04/16/18   Alisa Graff,  FNP  loperamide (LOPERAMIDE A-D) 2 MG tablet Take 4 mg by mouth as needed.     [provider]  meclizine (ANTIVERT) 25 MG tablet Take 25 mg by mouth 2 (two) times daily.     [provider]  metFORMIN (GLUCOPHAGE-XR) 500 MG 24 hr tablet Take 2 tablets (1,000 mg total) by mouth 2 (two) times daily. 03/31/18   Nicholes Mango, MD  Multiple Vitamin (MULTIVITAMIN WITH MINERALS) TABS tablet Take 1 tablet by mouth daily.    [provider]  omeprazole (PRILOSEC) 40 MG capsule Take 1 capsule (40 mg total) by mouth AC breakfast. 03/31/18   Gouru, Illene Silver, MD  ondansetron (ZOFRAN ODT) 4 MG disintegrating tablet Take 1 tablet (4 mg total) by mouth every 8 (eight) hours as needed for nausea or vomiting. 05/29/18   Earleen Newport, MD  Potassium Chloride ER 20 MEQ TBCR Take 10 mEq by mouth daily. Patient not taking: Reported on 07/03/2018 06/24/18   Cuthriell, Charline Bills, PA-C  potassium chloride SA (K-DUR,KLOR-CON) 20 MEQ tablet Take 1 tablet (20 mEq total) by mouth daily. 04/16/18   Alisa Graff, FNP  predniSONE (DELTASONE) 20 MG tablet Take 3 tablets (60 mg total) by mouth daily for 4 days. 07/09/18 07/13/18  Rudene Re, MD  promethazine (PHENERGAN) 25 MG tablet Take 25 mg by mouth every 6 (six) hours as needed for nausea or vomiting (before transportation).     [provider]  simvastatin (ZOCOR) 40 MG tablet Take 1 tablet (40 mg total) by mouth daily at 6 PM. 04/16/18   Alisa Graff, FNP  tamsulosin (FLOMAX) 0.4 MG CAPS capsule Take 0.4 mg by mouth daily.    [provider]    Allergies Eggs or egg-derived products; Novocain [procaine]; and Sulfa antibiotics  Family History  Problem Relation Age of Onset  . Hypertension Mother   . Heart disease Mother   . Heart failure Maternal Grandmother     Social History Social History   Tobacco Use  . Smoking status: Never Smoker  . Smokeless tobacco: Never Used  Substance Use Topics  . Alcohol use:  Never    Frequency: Never  . Drug use: Never    Review of Systems  Constitutional: Negative for fever. + chills Eyes: Negative for visual changes. ENT: Negative for sore throat. Neck: No neck pain  Cardiovascular: + chest pain. Respiratory: + shortness of breath, cough Gastrointestinal: Negative for abdominal pain. + vomiting and diarrhea. Genitourinary: Negative for dysuria. Musculoskeletal: + back pain. Skin: Negative for rash. Neurological: Negative for headaches, weakness or numbness. Psych: No SI or HI  ____________________________________________   PHYSICAL EXAM:  VITAL SIGNS: ED Triage Vitals  Enc Vitals Group     BP 07/09/18 1509 (!) 173/46     Pulse Rate 07/09/18 1509 (!) 56     Resp 07/09/18 1509 16     Temp 07/09/18 1509 98.5 F (36.9 C)     Temp Source 07/09/18 1509 Oral  SpO2 07/09/18 1509 96 %     Weight 07/09/18 1506 116 lb 8 oz (52.8 kg)     Height 07/09/18 1506 5\' 6"  (1.676 m)     Head Circumference --      Peak Flow --      Pain Score 07/09/18 1505 7     Pain Loc --      Pain Edu? --      Excl. in [place]? --     Constitutional: Alert and oriented. Well appearing and in no apparent distress. HEENT:      Head: Normocephalic and atraumatic.         Eyes: Conjunctivae are normal. Sclera is non-icteric.       Mouth/Throat: Mucous membranes are moist.       Neck: Supple with no signs of meningismus. Cardiovascular: Regular rate and rhythm. No murmurs, gallops, or rubs. 2+ symmetrical distal pulses are present in all extremities. No JVD. Respiratory: Normal respiratory effort. Decreased air movement bilaterally with faint expiratory wheezes. Gastrointestinal: Soft, non tender, and non distended with positive bowel sounds. No rebound or guarding. Musculoskeletal: 1+ pitting edema bilaterally. NO midline c/t/l spine tenderness Neurologic: Normal speech and language. Face is symmetric. Moving all extremities. No gross focal neurologic deficits are  appreciated. Skin: Skin is warm, dry and intact. No rash noted. Psychiatric: Mood and affect are normal. Speech and behavior are normal.  ____________________________________________   LABS (all labs ordered are listed, but only abnormal results are displayed)  Labs Reviewed  BASIC METABOLIC PANEL - Abnormal; Notable for the following components:      Result Value   Glucose, Bld 116 (*)    All other components within normal limits  CBC - Abnormal; Notable for the following components:   WBC 3.1 (*)    RBC 3.77 (*)    Hemoglobin 10.7 (*)    HCT 33.7 (*)    RDW 18.7 (*)    Platelets 130 (*)    All other components within normal limits  BRAIN NATRIURETIC PEPTIDE - Abnormal; Notable for the following components:   B Natriuretic Peptide 323.0 (*)    All other components within normal limits  TROPONIN I  INFLUENZA PANEL BY PCR (TYPE A & B)  TROPONIN I  URINALYSIS, COMPLETE (UACMP) WITH MICROSCOPIC   ____________________________________________  EKG  ED ECG REPORT I, Rudene Re, the attending physician, personally viewed and interpreted this ECG.  Sinus bradycardia, rate of 57, normal intervals, normal axis, no ST elevations or depressions. Unchanged from prior.  ____________________________________________  RADIOLOGY  I have personally reviewed the images performed during this visit and I agree with the Radiologist's read.   Interpretation by Radiologist:  Dg Chest 2 View  Result Date: 07/09/2018 CLINICAL DATA:  67 year old male with chest pain EXAM: CHEST - 2 VIEW COMPARISON:  Prior chest x-ray 07/03/2018 FINDINGS: The lungs are clear and negative for focal airspace consolidation, pulmonary edema or suspicious pulmonary nodule. No pleural effusion or pneumothorax. Cardiac and mediastinal contours are within normal limits. No acute fracture or lytic or blastic osseous lesions. The visualized upper abdominal bowel gas pattern is unremarkable. Surgical clips in the  right upper quadrant consistent with prior cholecystectomy. IMPRESSION: No active cardiopulmonary disease. Electronically Signed   By: Jacqulynn Cadet M.D.   On: 07/09/2018 15:33   Dg Lumbar Spine Complete  Result Date: 07/09/2018 CLINICAL DATA:  Lower back pain without known injury. EXAM: LUMBAR SPINE - COMPLETE 4+ VIEW COMPARISON:  04/18/2018 FINDINGS: Maintained lumbar lordosis. No  acute lumbar spine fracture or suspicious osseous lesions. Lumbar facet arthrosis L3 through S1, most marked at L5-S1. No pars defects or listhesis. The included SI joints are maintained without abnormal fusion or erosion. IMPRESSION: Lumbar facet arthrosis L3 through S1. No acute osseous abnormality. Electronically Signed   By: Ashley Royalty M.D.   On: 07/09/2018 19:56     ____________________________________________   PROCEDURES  Procedure(s) performed: None Procedures Critical Care performed:  None ____________________________________________   INITIAL IMPRESSION / ASSESSMENT AND PLAN / ED COURSE  67 y.o. male with a history of CHF with preserved EF, COPD, diabetes, hypertension, sleep apnea, homelessness, CAD who presents for evaluation of shortness of breath, active cough, chest tightness, chills, vomiting and diarrhea. Patient is well-appearing with normal work of breathing, no hypoxia, has severely diminished air movement bilaterally with faint expiratory wheezes, b/l 1+ pitting edema. EKG showed no evidence of ischemia or dysrhythmias.Troponin negative. chest x-ray showed no evidence of pulmonary edema, pneumothorax, pneumonia.labs show a stable hemoglobin, stable leukopenia and thrombocytopenia, normal electrolytes. Ddx flu vs viral illness causing COPD exacerbation. patient received Solu-Medrol per EMS. Will give duonebs, doxycycline for bronchitis/ COPD. Will give zofran and genlte IV hydration   # back pain: x 2 days, diffuse in the lower back. XR concerning for lumbar arthritis. No signs of cauda  equina. No abdominal pain or pulsatile mass. No urinary symptoms.    _________________________ 8:06 PM on 07/09/2018 -----------------------------------------  flu negative. Patient remains well appearing with normal work of breathing and no oxygen requirement. We'll discharge home on prednisone, albuterol and doxycycline for bronchitis.   As part of my medical decision making, I reviewed the following data within the [place] notes reviewed and incorporated, Labs reviewed , EKG interpreted , Old EKG reviewed, Old chart reviewed, Radiograph reviewed , Notes from prior ED visits and [place] Controlled Substance Database    Pertinent labs & imaging results that were available during my care of the patient were reviewed by me and considered in my medical decision making (see chart for details).    ____________________________________________   FINAL CLINICAL IMPRESSION(S) / ED DIAGNOSES  Final diagnoses:  Bronchitis  Arthritis of lumbar spine      NEW MEDICATIONS STARTED DURING THIS VISIT:  ED Discharge Orders         Ordered    predniSONE (DELTASONE) 20 MG tablet  Daily     07/09/18 2004    doxycycline (VIBRAMYCIN) 100 MG capsule  2 times daily     07/09/18 2004    albuterol (PROVENTIL HFA;VENTOLIN HFA) 108 (90 Base) MCG/ACT inhaler  Every 6 hours PRN     07/09/18 2004           Note:  This document was prepared using Dragon voice recognition software and may include unintentional dictation errors.    Rudene Re, MD 07/09/18 2007

## 2018-07-09 NOTE — ED Notes (Signed)
Called no answer

## 2018-07-09 NOTE — ED Notes (Addendum)
Pt ambulatory to toilet and provided a drink

## 2018-07-15 ENCOUNTER — Encounter (HOSPITAL_COMMUNITY): Payer: Self-pay

## 2018-07-15 ENCOUNTER — Other Ambulatory Visit (HOSPITAL_COMMUNITY): Payer: Self-pay
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
## 2018-07-17 ENCOUNTER — Ambulatory Visit: Payer: Medicare Other

## 2018-07-28 ENCOUNTER — Other Ambulatory Visit (HOSPITAL_COMMUNITY): Payer: Self-pay

## 2018-07-28 ENCOUNTER — Encounter (HOSPITAL_COMMUNITY): Payer: Self-pay

## 2018-07-28 ENCOUNTER — Other Ambulatory Visit: Payer: Self-pay

## 2018-07-28 ENCOUNTER — Encounter: Payer: Self-pay | Admitting: Emergency Medicine

## 2018-07-28 DIAGNOSIS — I110 Hypertensive heart disease with heart failure: Secondary | ICD-10-CM | POA: Insufficient documentation

## 2018-07-28 DIAGNOSIS — Z7984 Long term (current) use of oral hypoglycemic drugs: Secondary | ICD-10-CM | POA: Diagnosis not present

## 2018-07-28 DIAGNOSIS — E119 Type 2 diabetes mellitus without complications: Secondary | ICD-10-CM | POA: Diagnosis not present

## 2018-07-28 DIAGNOSIS — J449 Chronic obstructive pulmonary disease, unspecified: Secondary | ICD-10-CM | POA: Diagnosis not present

## 2018-07-28 DIAGNOSIS — I5032 Chronic diastolic (congestive) heart failure: Secondary | ICD-10-CM | POA: Diagnosis not present

## 2018-07-28 DIAGNOSIS — Z79899 Other long term (current) drug therapy: Secondary | ICD-10-CM | POA: Diagnosis not present

## 2018-07-28 DIAGNOSIS — R51 Headache: Secondary | ICD-10-CM | POA: Diagnosis present

## 2018-07-28 DIAGNOSIS — G43809 Other migraine, not intractable, without status migrainosus: Secondary | ICD-10-CM | POA: Diagnosis not present

## 2018-07-28 DIAGNOSIS — Z7982 Long term (current) use of aspirin: Secondary | ICD-10-CM | POA: Insufficient documentation

## 2018-07-28 MED ORDER — ONDANSETRON 4 MG PO TBDP
4.0000 mg | ORAL_TABLET | Freq: Once | ORAL | Status: AC | PRN
  Administered 2018-07-28: 4 mg via ORAL
  Filled 2018-07-28: qty 1

## 2018-07-28 NOTE — Progress Notes (Signed)
Bp: 170/80, pulse: 68, resp: 18, spo2: 31  [name] states still homeless staying at a church.  He has someone with a service trying to help him find a place to live.  He eats at homeless shelter. He is staying out of the weather.  He states he feels much better than he has in past couple of months.  He just picked up all his meds. Meds verified.  He is aware of appts he has coming up.  He is not weighing, scale in storage. He denies chest pain, shortness of breath, dizziness or headaches.  Lungs are clear. Swelling pus 1 edema, he states his normal.  Will continue to visit weekly to check on him and med compliance.   [place] [phone]

## 2018-07-28 NOTE — ED Triage Notes (Addendum)
Here for migraine starting about 1 hr ago. + vomiting X 1, photophobia. Reports sound makes worse and even air flow makes headache worse.  Patient states "only narcotics work, they have tried all other medicines and nothing helps".  VSS. Ambulatory. Feels like normal migraine.

## 2018-07-29 ENCOUNTER — Emergency Department
Admission: EM | Admit: 2018-07-29 | Discharge: 2018-07-29 | Disposition: A | Discharge status: Home / Self Care | Payer: Medicare Other | Attending: Emergency Medicine | Admitting: Emergency Medicine

## 2018-07-29 DIAGNOSIS — G43809 Other migraine, not intractable, without status migrainosus: Secondary | ICD-10-CM

## 2018-07-29 MED ORDER — ONDANSETRON 4 MG PO TBDP: 4.0000 mg | ORAL_TABLET | Freq: Three times a day (TID) | ORAL | 0 refills | Status: DC | PRN

## 2018-07-29 MED ORDER — ONDANSETRON 4 MG PO TBDP
4.0000 mg | ORAL_TABLET | Freq: Once | ORAL | Status: AC
  Administered 2018-07-29: 4 mg via ORAL
  Filled 2018-07-29: qty 1

## 2018-07-29 MED ORDER — HYDROCODONE-ACETAMINOPHEN 5-325 MG PO TABS
1.0000 | ORAL_TABLET | Freq: Once | ORAL | Status: AC
  Administered 2018-07-29: 1 via ORAL
  Filled 2018-07-29: qty 1

## 2018-07-29 MED ORDER — HYDROCODONE-ACETAMINOPHEN 5-325 MG PO TABS: 1.0000 | ORAL_TABLET | Freq: Four times a day (QID) | ORAL | 0 refills | Status: DC | PRN

## 2018-07-29 NOTE — Discharge Instructions (Addendum)
1.  You may take pain and nausea medicines as needed (Norco/Zofran). 2.  Return to the ER for worsening symptoms, persistent vomiting, lethargy or other concerns.
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
## 2018-08-01 ENCOUNTER — Emergency Department
Admission: EM | Admit: 2018-08-01 | Discharge: 2018-08-02 | Disposition: A | Discharge status: Home / Self Care | Payer: Medicare Other | Attending: Emergency Medicine | Admitting: Emergency Medicine

## 2018-08-01 ENCOUNTER — Emergency Department: Payer: Medicare Other

## 2018-08-01 ENCOUNTER — Other Ambulatory Visit: Payer: Self-pay

## 2018-08-01 DIAGNOSIS — Z5900 Homelessness unspecified: Secondary | ICD-10-CM

## 2018-08-01 DIAGNOSIS — I5032 Chronic diastolic (congestive) heart failure: Secondary | ICD-10-CM | POA: Insufficient documentation

## 2018-08-01 DIAGNOSIS — I110 Hypertensive heart disease with heart failure: Secondary | ICD-10-CM | POA: Diagnosis not present

## 2018-08-01 DIAGNOSIS — E119 Type 2 diabetes mellitus without complications: Secondary | ICD-10-CM | POA: Insufficient documentation

## 2018-08-01 DIAGNOSIS — M545 Low back pain, unspecified: Secondary | ICD-10-CM

## 2018-08-01 DIAGNOSIS — J449 Chronic obstructive pulmonary disease, unspecified: Secondary | ICD-10-CM | POA: Insufficient documentation

## 2018-08-01 DIAGNOSIS — Z79899 Other long term (current) drug therapy: Secondary | ICD-10-CM | POA: Insufficient documentation

## 2018-08-01 DIAGNOSIS — G8929 Other chronic pain: Secondary | ICD-10-CM | POA: Diagnosis not present

## 2018-08-01 DIAGNOSIS — Z7984 Long term (current) use of oral hypoglycemic drugs: Secondary | ICD-10-CM | POA: Insufficient documentation

## 2018-08-01 DIAGNOSIS — Z7982 Long term (current) use of aspirin: Secondary | ICD-10-CM | POA: Insufficient documentation

## 2018-08-01 LAB — CBC WITH DIFFERENTIAL/PLATELET
Abs Immature Granulocytes: 0 10*3/uL (ref 0.00–0.07)
BASOS ABS: 0 10*3/uL (ref 0.0–0.1)
BASOS PCT: 1 %
EOS ABS: 0.1 10*3/uL (ref 0.0–0.5)
Eosinophils Relative: 4 %
HCT: 32.8 % — ABNORMAL LOW (ref 39.0–52.0)
Hemoglobin: 10.4 g/dL — ABNORMAL LOW (ref 13.0–17.0)
IMMATURE GRANULOCYTES: 0 %
Lymphocytes Relative: 37 %
Lymphs Abs: 1 10*3/uL (ref 0.7–4.0)
MCH: 28.1 pg (ref 26.0–34.0)
MCHC: 31.7 g/dL (ref 30.0–36.0)
MCV: 88.6 fL (ref 80.0–100.0)
MONOS PCT: 20 %
Monocytes Absolute: 0.6 10*3/uL (ref 0.1–1.0)
NEUTROS PCT: 38 %
NRBC: 0 % (ref 0.0–0.2)
Neutro Abs: 1.1 10*3/uL — ABNORMAL LOW (ref 1.7–7.7)
PLATELETS: 109 10*3/uL — AB (ref 150–400)
RBC: 3.7 MIL/uL — ABNORMAL LOW (ref 4.22–5.81)
RDW: 17.5 % — AB (ref 11.5–15.5)
WBC: 2.8 10*3/uL — AB (ref 4.0–10.5)

## 2018-08-01 LAB — BASIC METABOLIC PANEL
ANION GAP: 6 (ref 5–15)
BUN: 13 mg/dL (ref 8–23)
CO2: 26 mmol/L (ref 22–32)
Calcium: 9.1 mg/dL (ref 8.9–10.3)
Chloride: 109 mmol/L (ref 98–111)
Creatinine, Ser: 0.95 mg/dL (ref 0.61–1.24)
GFR calc Af Amer: >60 mL/min (ref >60)
GFR calc non Af Amer: >60 mL/min (ref >60)
GLUCOSE: 91 mg/dL (ref 70–99)
POTASSIUM: 3.8 mmol/L (ref 3.5–5.1)
Sodium: 141 mmol/L (ref 135–145)

## 2018-08-01 LAB — URINALYSIS, COMPLETE (UACMP) WITH MICROSCOPIC
BILIRUBIN URINE: NEGATIVE
Bacteria, UA: NONE SEEN
GLUCOSE, UA: NEGATIVE mg/dL
HGB URINE DIPSTICK: NEGATIVE
Ketones, ur: NEGATIVE mg/dL
LEUKOCYTES UA: NEGATIVE
NITRITE: NEGATIVE
Protein, ur: 30 mg/dL — AB
SPECIFIC GRAVITY, URINE: 1.023 (ref 1.005–1.030)
SQUAMOUS EPITHELIAL / LPF: NONE SEEN (ref 0–5)
pH: 5 (ref 5.0–8.0)

## 2018-08-01 NOTE — ED Notes (Signed)
Spoke with md who will see pt and place orders.

## 2018-08-01 NOTE — ED Notes (Signed)
Pt previously provided with urinal for urine sample.

## 2018-08-01 NOTE — ED Notes (Signed)
Patient transported to CT 

## 2018-08-01 NOTE — ED Provider Notes (Addendum)
[facility] Emergency Department Provider Note  ____________________________________________   I have reviewed the triage vital signs and the nursing notes. Where available I have reviewed prior notes and, if possible and indicated, outside hospital notes.    HISTORY  Chief Complaint Leg Pain; Back Pain; and Shortness of Breath    HPI [name] is a 67 y.o. male  Who presents today complaining of back pain which is chronic and been there for years.  He states that it is the same pain he is always had.  There is no numbness or weakness no radiation of symptoms, he has been seen here several times for this.  Patient is homeless.  He states he has been staying in a hotel but he ran out of money today and when he started to go out of the hotel he suddenly had worsening pain.  While he was in the hotel and had somewhere to stay however the pain was not bad.  There is been no fall no abdominal pain no trauma, no incontinence of bowel or bladder.  No chest pain and no shortness of breath.  He is also concerned about possibly trying to find somewhere else to stay he states.  [facility] [place] [facility].   Past Medical History:  Diagnosis Date  . Asthma   . CHF (congestive heart failure) ([place])   . COPD (chronic obstructive pulmonary disease) ([place])   . Diabetes mellitus without complication ([place])   . Homelessness   . Hypertension   . Migraine   . Obesity   . Sleep apnea     Patient Active Problem List   Diagnosis Date Noted  . HTN (hypertension) 04/17/2018  . DM (diabetes mellitus) ([place]) 04/17/2018  . Lymphedema 04/17/2018  . Chronic diastolic CHF (congestive heart failure) ([place]) 04/07/2018  . Anemia 04/07/2018  . Bilateral leg pain 03/29/2018  . Chest pain 03/20/2018  . Adjustment disorder with mixed anxiety and depressed mood 12/16/2017  . Homelessness 12/16/2017    Past Surgical History:  Procedure Laterality Date  . CARDIAC CATHETERIZATION     . CHOLECYSTECTOMY    . EYE SURGERY    . INNER EAR SURGERY    . NOSE SURGERY      Prior to Admission medications   Medication Sig Start Date End Date Taking? Authorizing Provider  acetaminophen (TYLENOL) 650 MG CR tablet Take 1 tablet (650 mg total) by mouth 2 (two) times daily as needed for pain. 03/31/18   Gouru, Illene Silver, MD  albuterol (PROVENTIL HFA;VENTOLIN HFA) 108 (90 Base) MCG/ACT inhaler Inhale 2 puffs into the lungs every 6 (six) hours as needed for wheezing or shortness of breath. 07/09/18   Rudene Re, MD  aspirin EC 81 MG tablet Take 81 mg by mouth daily.    [provider]  beclomethasone (BECONASE-AQ) 42 MCG/SPRAY nasal spray Place 1 spray into both nostrils 2 (two) times daily. Dose is for each nostril.    [provider]  Dextromethorphan-guaiFENesin (WAL-TUSSIN COUGH/CHEST DM MAX) 10-200 MG/5ML LIQD Take 10 mLs by mouth every 4 (four) hours as needed.    [provider]  Dextrose, Diabetic Use, (DEXTROSE PO) Take by mouth.    [provider]  diphenhydrAMINE (BENADRYL) 25 MG tablet Take 50 mg by mouth every evening.     [provider]  EPINEPHrine 0.3 MG/0.3ML SOSY Inject 0.3 mLs (0.3 mg total) into the muscle as needed. 06/30/18   Alisa Graff, FNP  furosemide (LASIX) 40  MG tablet Take 2 tablets (80 mg total) by mouth daily. 04/16/18   Alisa Graff, FNP  HYDROcodone-acetaminophen (NORCO) 5-325 MG tablet Take 1 tablet by mouth every 6 (six) hours as needed for moderate pain. 07/29/18   Paulette Blanch, MD  lisinopril (PRINIVIL,ZESTRIL) 10 MG tablet Take 1 tablet (10 mg total) by mouth daily. 04/16/18   Alisa Graff, FNP  loperamide (LOPERAMIDE A-D) 2 MG tablet Take 4 mg by mouth as needed.     [provider]  meclizine (ANTIVERT) 25 MG tablet Take 25 mg by mouth 2 (two) times daily.     [provider]  metFORMIN (GLUCOPHAGE-XR) 500 MG 24 hr tablet Take 2 tablets (1,000 mg total) by mouth 2 (two) times  daily. 03/31/18   Nicholes Mango, MD  Multiple Vitamin (MULTIVITAMIN WITH MINERALS) TABS tablet Take 1 tablet by mouth daily.    [provider]  omeprazole (PRILOSEC) 40 MG capsule Take 1 capsule (40 mg total) by mouth AC breakfast. 03/31/18   Gouru, Illene Silver, MD  ondansetron (ZOFRAN ODT) 4 MG disintegrating tablet Take 1 tablet (4 mg total) by mouth every 8 (eight) hours as needed for nausea or vomiting. 07/29/18   Paulette Blanch, MD  Potassium Chloride ER 20 MEQ TBCR Take 10 mEq by mouth daily. Patient not taking: Reported on 07/28/2018 06/24/18   Cuthriell, Charline Bills, PA-C  potassium chloride SA (K-DUR,KLOR-CON) 20 MEQ tablet Take 1 tablet (20 mEq total) by mouth daily. 04/16/18   Alisa Graff, FNP  promethazine (PHENERGAN) 25 MG tablet Take 25 mg by mouth every 6 (six) hours as needed for nausea or vomiting (before transportation).     [provider]  simvastatin (ZOCOR) 40 MG tablet Take 1 tablet (40 mg total) by mouth daily at 6 PM. 04/16/18   Alisa Graff, FNP  tamsulosin (FLOMAX) 0.4 MG CAPS capsule Take 0.4 mg by mouth daily.    [provider]    Allergies Eggs or egg-derived products; Novocain [procaine]; and Sulfa antibiotics  Family History  Problem Relation Age of Onset  . Hypertension Mother   . Heart disease Mother   . Heart failure Maternal Grandmother     Social History Social History   Tobacco Use  . Smoking status: Never Smoker  . Smokeless tobacco: Never Used  Substance Use Topics  . Alcohol use: Never    Frequency: Never  . Drug use: Never    Review of Systems Constitutional: No fever/chills Eyes: No visual changes. ENT: No sore throat. No stiff neck no neck pain Cardiovascular: Denies chest pain. Respiratory: Denies shortness of breath. Gastrointestinal:   no vomiting.  No diarrhea.  No constipation. Genitourinary: Negative for dysuria. Musculoskeletal: Negative lower extremity swelling Skin: Negative for rash. Neurological:  Negative for severe headaches, focal weakness or numbness.   ____________________________________________   PHYSICAL EXAM:  VITAL SIGNS: ED Triage Vitals [08/01/18 2003]  Enc Vitals Group     BP (!) 143/66     Pulse Rate (!) 59     Resp 16     Temp 98.5 F (36.9 C)     Temp Source Oral     SpO2 98 %     Weight 220 lb 7.4 oz (100 kg)     Height 5\' 6"  (1.676 m)     Head Circumference      Peak Flow      Pain Score 8     Pain Loc      Pain  Edu?      Excl. in [place]?     Constitutional: Alert and oriented. Well appearing and in no acute distress. Eyes: Conjunctivae are normal Head: Atraumatic HEENT: No congestion/rhinnorhea. Mucous membranes are moist.  Oropharynx non-erythematous Neck:   Nontender with no meningismus, no masses, no stridor Cardiovascular: Normal rate, regular rhythm. Grossly normal heart sounds.  Good peripheral circulation. Respiratory: Normal respiratory effort.  No retractions. Lungs CTAB. Abdominal: Soft and nontender. No distention. No guarding no rebound Back:   no midline tenderness there are no lesions noted. there is no CVA tenderness there is distractible lumbar discomfort. Musculoskeletal: No lower extremity tenderness, no upper extremity tenderness. No joint effusions, no DVT signs strong distal pulses no edema Neurologic:  Normal speech and language. No gross focal neurologic deficits are appreciated.  No saddle anesthesia, declines rectal exam Skin:  Skin is warm, dry and intact. No rash noted. Psychiatric: Mood and affect are normal. Speech and behavior are normal.  ____________________________________________   LABS (all labs ordered are listed, but only abnormal results are displayed)  Labs Reviewed  CBC WITH DIFFERENTIAL/PLATELET - Abnormal; Notable for the following components:      Result Value   WBC 2.8 (*)    RBC 3.70 (*)    Hemoglobin 10.4 (*)    HCT 32.8 (*)    RDW 17.5 (*)    Platelets 109 (*)    Neutro Abs 1.1 (*)    All  other components within normal limits  URINALYSIS, COMPLETE (UACMP) WITH MICROSCOPIC - Abnormal; Notable for the following components:   Color, Urine AMBER (*)    APPearance CLEAR (*)    Protein, ur 30 (*)    All other components within normal limits  BASIC METABOLIC PANEL    Pertinent labs  results that were available during my care of the patient were reviewed by me and considered in my medical decision making (see chart for details). ____________________________________________  EKG  I personally interpreted any EKGs ordered by me or triage  ____________________________________________  RADIOLOGY  Pertinent labs & imaging results that were available during my care of the patient were reviewed by me and considered in my medical decision making (see chart for details). If possible, patient and/or family made aware of any abnormal findings.  Dg Lumbar Spine Complete  Result Date: 08/01/2018 CLINICAL DATA:  67 y/o M; lumbar pain radiating to both lower extremities. EXAM: LUMBAR SPINE - COMPLETE 4+ VIEW COMPARISON:  03/29/2018 renal ultrasound. FINDINGS: 6 mm calcification projecting over lower pole of right kidney compatible with nephrolithiasis. Five lumbar type non-rib-bearing vertebral bodies. Normal lumbar lordosis without listhesis. Vertebral body and disc space heights are maintained. Multilevel small endplate marginal osteophytes. No acute fracture or dislocation. IMPRESSION: 1. No acute fracture or dislocation. 2. Mild lumbar spondylosis. 3. Right nephrolithiasis. Electronically Signed   By: Kristine Garbe M.D.   On: 08/01/2018 21:16   Ct Renal Stone Study  Result Date: 08/01/2018 CLINICAL DATA:  67 y/o M; shortness of breath, lower back pain, bilateral leg pain. Pain radiates down the posterior right leg. EXAM: CT ABDOMEN AND PELVIS WITHOUT CONTRAST TECHNIQUE: Multidetector CT imaging of the abdomen and pelvis was performed following the standard protocol without IV  contrast. COMPARISON:  None. FINDINGS: Lower chest: No acute abnormality. Hepatobiliary: Enlarged left lobe of liver with lobulated contour, and recanalized umbilical vein compatible with cirrhosis. Cholecystectomy. Enlarged common bile duct measuring up to 15 mm. Pancreas: Unremarkable. No pancreatic ductal dilatation or surrounding inflammatory changes. Spleen: Spleen  measures 11.8 x 5.1 x 11.9 cm (volume = 370 cm^3). Adrenals/Urinary Tract: Normal adrenal glands. Right kidney lower pole 7 mm and punctate nonobstructing stones. No ureter stone or hydronephrosis. Normal bladder. Stomach/Bowel: Stomach is within normal limits. Appendix appears normal. No evidence of bowel wall thickening, distention, or inflammatory changes. Vascular/Lymphatic: No significant vascular findings are present. No enlarged abdominal or pelvic lymph nodes. Reproductive: Prostatic calcifications. Other: No abdominal wall hernia or abnormality. No abdominopelvic ascites. Musculoskeletal: No fracture is seen. Mild lumbar spondylosis with multilevel small disc bulges and lower lumbar facet arthrosis. IMPRESSION: 1. No acute process identified. 2. Cirrhotic configuration of the liver. 3. Right kidney lower pole nonobstructing stones. 4. Mild spondylosis of the lumbar spine. Electronically Signed   By: Kristine Garbe M.D.   On: 08/01/2018 22:21   ____________________________________________    PROCEDURES  Procedure(s) performed: None  Procedures  Critical Care performed: None  ____________________________________________   INITIAL IMPRESSION / ASSESSMENT AND PLAN / ED COURSE  Pertinent labs & imaging results that were available during my care of the patient were reviewed by me and considered in my medical decision making (see chart for details).  Patient here with chronic back pain that seemed to hit as soon as he did not have a place to stay.  Is cold outside.  I do not see any acute pathology nothing to suggest  cauda equina syndrome we did a CT scan given his age no red flags to suggest AAA etc.  He is very comfortable here in the bed.  I think he is probably malingering, he may have chronic back pain but it seems as if his he is here every time he runs out of a place to stay, he is expressed interest in being seen by social work for possible placement he does have insurance,, I do not think the patient is acute admission the hospital.  His white count somewhat low but that is not unusual for him.  CT scan is unremarkable blood work is otherwise unremarkable, no evidence of pyelonephritis or stone disease.  Patient is here with some frequency.  I talked to the charge nurse, he has no SI or HI but I am little bit concerned about sending a 67 year old male out to a December night with nowhere to stay, who is expressing interest in placement.  We will consult social work.  Signed out at the end of my shift to dr. Mable Paris   ____________________________________________   FINAL CLINICAL IMPRESSION(S) / ED DIAGNOSES  Final diagnoses:  None      This chart was dictated using voice recognition software.  Despite best efforts to proofread,  errors can occur which can change meaning.      Schuyler Amor, MD 08/01/18 [id number]    Schuyler Amor, MD 08/01/18 2325

## 2018-08-01 NOTE — ED Triage Notes (Signed)
Pt states over 3 months of shob, low back pain and bilateral leg pain. Pt with unlabored resps. Pt appears in no acute distress. Pt states pain radiates down posterior right leg from back.

## 2018-08-02 NOTE — ED Notes (Signed)
Pt up to restroom.

## 2018-08-02 NOTE — ED Notes (Signed)
Pt states he would like to talk to someone about getting into a long term care facility. md aware.

## 2018-08-02 NOTE — ED Notes (Signed)
Social work at bedside.  

## 2018-08-02 NOTE — ED Provider Notes (Signed)
Patient pending evaluation by social work.  Medically cleared at this time.  Physical Exam  BP (!) 157/75 (BP Location: Left Arm)   Pulse (!) 51   Temp 98.5 F (36.9 C) (Oral)   Resp 16   Ht 5\' 6"  (1.676 m)   Wt 100 kg   SpO2 99%   BMI 35.58 kg/m  ----------------------------------------- 9:32 AM on 08/02/2018 -----------------------------------------   Physical Exam Patient resting in his hallway bed without acute distress.  Sits up without assistance.  Does not show any objective signs of pain. ED Course/Procedures     Procedures  MDM  Evaluated by social worker, Claudine, who says that she will be giving the patient several referrals including [facility].  Patient also be given food as well as transportation vouchers.  Because of past criminal history social work states that it will be very hard to place the patient immediately in a shelter.  Patient understands the plan and is willing to comply.      Drew Pyo, MD 08/02/18 [phone]

## 2018-08-02 NOTE — ED Notes (Signed)
Pt provided with ice and popsicle.

## 2018-08-02 NOTE — Clinical Social Work Note (Signed)
Clinical Social Work Assessment  Patient Details  Name: [name] MRN: [id number] Date of Birth: 1950/12/15  Date of referral:  08/02/18               Reason for consult:  Housing Concerns/Homelessness, Other (Comment Required)(Serious criminal record)                Permission sought to share information with:    Permission granted to share information::  No  Name::        Agency::     Relationship::     Contact Information:     Housing/Transportation Living arrangements for the past 2 months:  Hotel/Motel Source of Information:  Patient Patient Interpreter Needed:  None Criminal Activity/Legal Involvement Pertinent to Current Situation/Hospitalization:  No - Comment as needed(Has probation 5 years 9 months-Mike Hallworth [phone]) Significant Relationships:  [employer] Lives with:  Self Do you feel safe going back to the place where you live?  Yes Need for family participation in patient care:  No (Coment)  Care giving concerns:  None   Facilities manager / plan:  LCSW introduced myself to patient. He has been discharged and needed resources for additional housing and meal plans that offer free food. LCSW conducted full assessment and due to patients criminal past ( 9 years incarcerated) out as of April 2019. Patient is not allowed to stay in any area shelter however is connected  [place] and [employer]. Patient refuses group home and [facility]- and based on his probation he is not allowed within 1000 feet of children of vulnerable sector in the community. He agrees he is hard to place and received SSDI 991.00 month and has medications from 9 doctors. He has medicare, medicaid  And united [employer]. His monthly expenses are cell phone and private insurance policies. He was provided [facility], Museum/gallery exhibitions officer, [facility] in [place] substance use issues and denied being homicidal or suicidal he reports he has panic disorder,  Chronic depression.    Employment status:  Disabled (Comment on whether or not currently receiving Disability) Insurance information:  Self Pay (Medicaid Pending), Medicare, Other (Comment Required)([employer]) PT Recommendations:  No Follow Up Information / Referral to community resources:  Outpatient Psychiatric Care (Comment Required)([facility])  Patient/Family's Response to care:  Agreeable to this plan  Patient/Family's Understanding of and Emotional Response to Diagnosis, Current Treatment, and Prognosis:  No contact with   Emotional Assessment Appearance:  Appears stated age Attitude/Demeanor/Rapport:  Engaged Affect (typically observed):  Calm Orientation:  Oriented to Self, Oriented to Situation, Oriented to Place, Oriented to  Time Alcohol / Substance use:  Not Applicable Psych involvement (Current and /or in the community):     Discharge Needs  Concerns to be addressed:  Homelessness Readmission within the last 30 days:  No Current discharge risk:  Homeless Barriers to Discharge:  Barriers Resolved   Joana Reamer, LCSW 08/02/2018, 9:58 AM

## 2018-08-02 NOTE — ED Notes (Signed)
Pt sleeping. 

## 2018-08-02 NOTE — ED Notes (Signed)
Report to angela, rn. 

## 2018-08-02 NOTE — ED Notes (Signed)
Pt in hallway bed, voices no complaints.

## 2018-08-02 NOTE — ED Notes (Signed)
Pt resting in hallway. Informed pt will attempt to move to room when available for sleep. Pt verbalizes understanding.

## 2018-08-02 NOTE — ED Notes (Signed)
Pt ambulated to restroom without difficulty

## 2018-08-02 NOTE — ED Notes (Signed)
Meal tray and po fluids provided 

## 2018-08-07 ENCOUNTER — Other Ambulatory Visit (HOSPITAL_COMMUNITY): Payer: Self-pay

## 2018-08-07 ENCOUNTER — Encounter (HOSPITAL_COMMUNITY): Payer: Self-pay

## 2018-08-07 NOTE — Progress Notes (Signed)
Met with [name] today outside.  He has found a place to stay with someone he knows temporarily.  Met outside due to people he staying with does not want no one inside that has lights on their unit.  He states been feeling good, seen psychiatry this past week and again next week.  He has a service looking for him a place to stay.  He states has food.  Appetite been good.  He has all his meds and has been taking them. Meds verified.  Not weighing, swelling down in legs, plus 1 edema which is good for him.  He states been resting better and not on his feet as much.  Made him aware of appts coming up next week, he states he will take transportation to them.  Due to the cold outside, hard to evaluate him, due to clothing unable to get BP, pulse strong and regular.  Breathing is been good he states, no congestions noted.  He was not coughing in presence of me, he states still has a cough from the pneumonia at times.  He states still taking cough syrup.  Denies chest pain, shortness of breath, headaches or dizziness.  Will continue to visit weekly.  [place] [phone]

## 2018-08-12 ENCOUNTER — Ambulatory Visit
Admission: RE | Admit: 2018-08-12 | Discharge: 2018-08-12 | Disposition: A | Discharge status: Home / Self Care | Payer: Medicare Other | Source: Ambulatory Visit | Attending: Gastroenterology | Admitting: Gastroenterology

## 2018-08-12 ENCOUNTER — Other Ambulatory Visit: Payer: Self-pay | Admitting: Gastroenterology

## 2018-08-12 DIAGNOSIS — K766 Portal hypertension: Secondary | ICD-10-CM | POA: Insufficient documentation

## 2018-08-12 DIAGNOSIS — K7460 Unspecified cirrhosis of liver: Secondary | ICD-10-CM | POA: Insufficient documentation

## 2018-08-12 DIAGNOSIS — K831 Obstruction of bile duct: Secondary | ICD-10-CM

## 2018-08-12 DIAGNOSIS — Z9049 Acquired absence of other specified parts of digestive tract: Secondary | ICD-10-CM | POA: Insufficient documentation

## 2018-08-12 MED ORDER — GADOBUTROL 1 MMOL/ML IV SOLN
10.0000 mL | Freq: Once | INTRAVENOUS | Status: AC | PRN
  Administered 2018-08-12: 10 mL via INTRAVENOUS

## 2018-08-14 ENCOUNTER — Encounter (HOSPITAL_COMMUNITY): Payer: Self-pay

## 2018-08-14 ENCOUNTER — Other Ambulatory Visit (HOSPITAL_COMMUNITY): Payer: Self-pay

## 2018-08-14 NOTE — Progress Notes (Signed)
Visit with Samier today, he states been doing well.  He is staying with a friend at the moment.  He is out of the cold.  He states has plenty of food.  Stays active.  Tries to watch high sodium foods.  Swelling down in extremities, plus 1 edema.  Denies shortness of breath, chest pain, dizziness or headaches today.  Too many clothes on to take bp, we meet outside due to circumstances.  His meds were verified, he has all meds except cough syrup.  His cough appears to be better, no coughing witnessed.  Will continue to visit weekly to check on him and med compliant.   [place] [phone]

## 2018-08-15 ENCOUNTER — Encounter: Payer: Self-pay | Admitting: Family

## 2018-08-15 ENCOUNTER — Ambulatory Visit: Payer: Medicare Other | Attending: Family | Admitting: Family

## 2018-08-15 VITALS — BP 153/69 | HR 52 | Resp 18 | Ht 66.0 in | Wt 232.2 lb

## 2018-08-15 DIAGNOSIS — E119 Type 2 diabetes mellitus without complications: Secondary | ICD-10-CM | POA: Diagnosis not present

## 2018-08-15 DIAGNOSIS — G4733 Obstructive sleep apnea (adult) (pediatric): Secondary | ICD-10-CM | POA: Insufficient documentation

## 2018-08-15 DIAGNOSIS — Z79899 Other long term (current) drug therapy: Secondary | ICD-10-CM | POA: Diagnosis not present

## 2018-08-15 DIAGNOSIS — Z7982 Long term (current) use of aspirin: Secondary | ICD-10-CM | POA: Insufficient documentation

## 2018-08-15 DIAGNOSIS — E669 Obesity, unspecified: Secondary | ICD-10-CM | POA: Diagnosis not present

## 2018-08-15 DIAGNOSIS — Z7984 Long term (current) use of oral hypoglycemic drugs: Secondary | ICD-10-CM | POA: Insufficient documentation

## 2018-08-15 DIAGNOSIS — I5032 Chronic diastolic (congestive) heart failure: Secondary | ICD-10-CM

## 2018-08-15 DIAGNOSIS — J449 Chronic obstructive pulmonary disease, unspecified: Secondary | ICD-10-CM | POA: Diagnosis not present

## 2018-08-15 DIAGNOSIS — Z7951 Long term (current) use of inhaled steroids: Secondary | ICD-10-CM | POA: Diagnosis not present

## 2018-08-15 DIAGNOSIS — E876 Hypokalemia: Secondary | ICD-10-CM | POA: Diagnosis not present

## 2018-08-15 DIAGNOSIS — I509 Heart failure, unspecified: Secondary | ICD-10-CM | POA: Diagnosis not present

## 2018-08-15 DIAGNOSIS — I110 Hypertensive heart disease with heart failure: Secondary | ICD-10-CM | POA: Diagnosis not present

## 2018-08-15 DIAGNOSIS — Z8249 Family history of ischemic heart disease and other diseases of the circulatory system: Secondary | ICD-10-CM | POA: Diagnosis not present

## 2018-08-15 DIAGNOSIS — Z590 Homelessness: Secondary | ICD-10-CM | POA: Diagnosis not present

## 2018-08-15 DIAGNOSIS — G43909 Migraine, unspecified, not intractable, without status migrainosus: Secondary | ICD-10-CM | POA: Insufficient documentation

## 2018-08-15 DIAGNOSIS — I10 Essential (primary) hypertension: Secondary | ICD-10-CM

## 2018-08-15 DIAGNOSIS — I890 Lymphedema, not elsewhere classified: Secondary | ICD-10-CM | POA: Insufficient documentation

## 2018-08-15 NOTE — Progress Notes (Signed)
Patient ID: [name], male    DOB: 1950/09/15, 67 y.o.   MRN: [id number]  HPI  [name] is a 67 y/o male with a history of DM, HTN, COPD, migraines, obstructive sleep apnea and chronic heart failure.  Echo report from 03/20/18 reviewed and showed an EF of 60-65% along with mild/moderate Drew and a mildly elevated PA pressure of 42 mm Hg.   Since last visit here on 06/30/18, he has been to the ED 4 times. Was in the ED 06/23/18 due to pneumonia and hypokalemia. Antibiotics and potassium supplement was given and he was released. Was in the ED 06/07/18 due to shortness of breath and chest pain. Antibiotics, prednisone and ENT referral was place and he was released. Was in the ED 05/29/18 due to cough and shortness of breath. Steroids and antibiotics were given and he was released. Was in the ED 04/18/18 due to a mechanical fall. Neck and back xrays were negative. Antibiotic was given for foot blister and he was released. Admitted 03/29/18 due to acute pulmonary edema. Cardiology consult obtained. Initially needed IV lasix and then transitioned to oral diuretics. Net negative output of 3.3L. Discharged after 2 days. Admitted 03/19/18 due to chest pain. Cardiology consult obtained. Cardiac enzymes and stress test were negative. Medications adjusted and he was discharged after 2 days.   He presents today for a follow-up visit with a chief complaint of minimal fatigue upon moderate exertion. He describes this as chronic in nature having been present for several years. He has associated cough, shortness of breath, pedal edema, headaches and difficulty sleeping along with this. He denies any dizziness, abdominal distention, palpitations, chest pain or weight gain. Currently living with a friend and says that is working out well for him right now. Can not go to the homeless shelter due to criminal background.   Past Medical History:  Diagnosis Date  . Asthma   . CHF (congestive heart failure) ([place])   . COPD  (chronic obstructive pulmonary disease) ([place])   . Diabetes mellitus without complication ([place])   . Homelessness   . Hypertension   . Migraine   . Obesity   . Sleep apnea    Past Surgical History:  Procedure Laterality Date  . CARDIAC CATHETERIZATION    . CHOLECYSTECTOMY    . EYE SURGERY    . INNER EAR SURGERY    . NOSE SURGERY     Family History  Problem Relation Age of Onset  . Hypertension Mother   . Heart disease Mother   . Heart failure Maternal Grandmother    Social History   Tobacco Use  . Smoking status: Never Smoker  . Smokeless tobacco: Never Used  Substance Use Topics  . Alcohol use: Never    Frequency: Never   Allergies  Allergen Reactions  . Eggs Or Egg-Derived Products Nausea And Vomiting  . Novocain [Procaine]   . Sulfa Antibiotics    Prior to Admission medications   Medication Sig Start Date End Date Taking? Authorizing Provider  acetaminophen (TYLENOL) 650 MG CR tablet Take 1 tablet (650 mg total) by mouth 2 (two) times daily as needed for pain. 03/31/18  Yes Gouru, Illene Silver, MD  albuterol (PROVENTIL HFA;VENTOLIN HFA) 108 (90 Base) MCG/ACT inhaler Inhale 2 puffs into the lungs every 6 (six) hours as needed for wheezing or shortness of breath. 07/09/18  Yes Alfred Levins, [place], MD  aspirin EC 81 MG tablet Take 81 mg by mouth daily.   Yes [provider]  beclomethasone (BECONASE-AQ) 42 MCG/SPRAY nasal spray Place 1 spray into both nostrils 2 (two) times daily. Dose is for each nostril.   Yes [provider]  cetirizine (ZYRTEC) 10 MG tablet TAKE 1 2 (ONE HALF) TABLET BY MOUTH ONCE DAILY 07/21/18  Yes [provider]  Dextromethorphan-guaiFENesin (WAL-TUSSIN COUGH/CHEST DM MAX) 10-200 MG/5ML LIQD Take 10 mLs by mouth every 4 (four) hours as needed.   Yes [provider]  Dextrose, Diabetic Use, (DEXTROSE PO) Take by mouth.   Yes [provider]  diphenhydrAMINE (BENADRYL) 25 MG tablet Take 50 mg by mouth every evening.     Yes [provider]  EPINEPHrine 0.3 MG/0.3ML SOSY Inject 0.3 mLs (0.3 mg total) into the muscle as needed. 06/30/18  Yes Hackney, [name] A, FNP  furosemide (LASIX) 40 MG tablet Take 2 tablets (80 mg total) by mouth daily. 04/16/18  Yes Hackney, [name] A, FNP  HYDROcodone-acetaminophen (NORCO) 5-325 MG tablet Take 1 tablet by mouth every 6 (six) hours as needed for moderate pain. 07/29/18  Yes Paulette Blanch, MD  lisinopril (PRINIVIL,ZESTRIL) 10 MG tablet Take 1 tablet (10 mg total) by mouth daily. 04/16/18  Yes Hackney, [name] A, FNP  loperamide (LOPERAMIDE A-D) 2 MG tablet Take 4 mg by mouth as needed.    Yes [provider]  meclizine (ANTIVERT) 25 MG tablet Take 25 mg by mouth 2 (two) times daily.    Yes [provider]  metFORMIN (GLUCOPHAGE-XR) 500 MG 24 hr tablet Take 2 tablets (1,000 mg total) by mouth 2 (two) times daily. 03/31/18  Yes Gouru, Illene Silver, MD  Multiple Vitamin (MULTIVITAMIN WITH MINERALS) TABS tablet Take 1 tablet by mouth daily.   Yes [provider]  omeprazole (PRILOSEC) 40 MG capsule Take 1 capsule (40 mg total) by mouth AC breakfast. 03/31/18  Yes Gouru, Aruna, MD  ondansetron (ZOFRAN ODT) 4 MG disintegrating tablet Take 1 tablet (4 mg total) by mouth every 8 (eight) hours as needed for nausea or vomiting. 07/29/18  Yes Paulette Blanch, MD  PARoxetine (PAXIL) 10 MG tablet TAKE 1 TABLET BY MOUTH ONCE DAILY FOR MOOD 07/21/18  Yes [provider]  potassium chloride (K-DUR) 10 MEQ tablet Take 1 tablet by mouth daily. 07/29/18  Yes [provider]  potassium chloride SA (K-DUR,KLOR-CON) 20 MEQ tablet Take 1 tablet (20 mEq total) by mouth daily. 04/16/18  Yes Hackney, Aura Fey, FNP  promethazine (PHENERGAN) 25 MG tablet Take 25 mg by mouth every 6 (six) hours as needed for nausea or vomiting (before transportation).    Yes [provider]  simvastatin (ZOCOR) 40 MG tablet Take 1 tablet (40 mg total) by mouth daily at 6 PM. 04/16/18  Yes  Darylene Price A, FNP  tamsulosin (FLOMAX) 0.4 MG CAPS capsule Take 0.4 mg by mouth daily.   Yes [provider]    Review of Systems  Constitutional: Positive for fatigue. Negative for appetite change.  HENT: Negative for congestion, postnasal drip and sore throat.   Eyes: Negative.   Respiratory: Positive for cough (dry cough at time) and shortness of breath.   Cardiovascular: Positive for leg swelling (improving). Negative for chest pain and palpitations.  Gastrointestinal: Negative for abdominal distention and abdominal pain.  Endocrine: Negative.   Genitourinary: Negative.   Musculoskeletal: Positive for back pain and neck pain.  Skin: Negative.   Allergic/Immunologic: Negative.   Neurological: Positive for headaches (migraines couple days of week). Negative for dizziness and light-headedness.  Hematological: Negative for adenopathy. Does  not bruise/bleed easily.  Psychiatric/Behavioral: Positive for sleep disturbance (not sleeping well). Negative for dysphoric mood. The patient is not nervous/anxious.    Vitals:   08/15/18 1032  BP: (!) 153/69  Pulse: (!) 52  Resp: 18  SpO2: 100%  Weight: 232 lb 4 oz (105.3 kg)  Height: 5\' 6"  (1.676 m)   Wt Readings from Last 3 Encounters:  08/15/18 232 lb 4 oz (105.3 kg)  08/14/18 248 lb (112.5 kg)  08/01/18 220 lb 7.4 oz (100 kg)   Lab Results  Component Value Date   CREATININE 0.95 08/01/2018   CREATININE 1.02 07/09/2018   CREATININE 0.94 07/03/2018    Physical Exam Vitals signs and nursing note reviewed.  Constitutional:      Appearance: He is well-developed.  HENT:     Head: Normocephalic and atraumatic.  Neck:     Musculoskeletal: Normal range of motion and neck supple.     Vascular: No JVD.  Cardiovascular:     Rate and Rhythm: Bradycardia present. Rhythm irregular.  Pulmonary:     Effort: Pulmonary effort is normal. No respiratory distress.     Breath sounds: No wheezing or rales.  Abdominal:     General:  There is no distension.     Palpations: Abdomen is soft.  Musculoskeletal:        General: No tenderness.     Right lower leg: He exhibits no tenderness. Edema (1+ pitting) present.     Left lower leg: He exhibits no tenderness. Edema (1+ pitting) present.  Skin:    General: Skin is warm and dry.  Neurological:     Mental Status: He is alert and oriented to person, place, and time.  Psychiatric:        Behavior: Behavior normal.    Assessment & Plan:  1: Chronic heart failure with preserved ejection fraction- - NYHA class II - euvolemic today - weighing daily and currently has scales on carpet; advised to move it to a flat floor - weight down 6 pounds since he was last here 6 weeks ago - not adding salt to his food ; When he doesn't have a place to live, he tends to eat saltier foods; currently living with a friend and says that he has plenty to eat - discussed the importance of closely following a 2000mg  sodium diet as much as he can - does walk ~ 3 miles daily - saw cardiology [name]) 04/07/18 - BNP 06/23/18 was 62.0 - participating in paramedicine program - has not received his flu vaccine yet  2: HTN- - BP mildly elevated today but patient is coughing quite a bit  - saw PCP at [facility] 04/19/18 & needs to schedule a follow-up appointment - BMP 08/01/18 reviewed and showed sodium 141, potassium 3.8, creatinine 0.95 and GFR >60  3: DM- - A1c 03/20/18 was 5.5% - most recent glucose was 120  4: Lymphedema- - stage 2 - does walk for transportation/ uses [employer] for longer trips - wearing compression socks with improvement in his edema - also encouraged him to elevate his legs  - could consider lymphapress compression pumps if his housing situation stabilizes  Medication bottles were reviewed.  Return in 3 months or sooner for any questions/problems before then.

## 2018-08-15 NOTE — Patient Instructions (Signed)
Continue weighing daily and call for an overnight weight gain of > 2 pounds or a weekly weight gain of >5 pounds. 

## 2018-08-17 ENCOUNTER — Encounter: Payer: Self-pay | Admitting: Gastroenterology

## 2018-08-18 ENCOUNTER — Encounter: Payer: Self-pay | Admitting: Family

## 2018-08-22 ENCOUNTER — Telehealth: Payer: Self-pay

## 2018-08-22 NOTE — Telephone Encounter (Signed)
Spoke with pt and informed him of MRCP results and Dr. Georgeann Oppenheim instructions to follow up with him to discuss treatment.

## 2018-08-22 NOTE — Telephone Encounter (Signed)
-----   Message from Jonathon Bellows, MD sent at 08/17/2018  4:32 PM EST ----- Sherald Hess inform MRCP shows onlyu cirrhosis of the liver. Suggest office visit to discuss treatment as he has cirrhosis.   C/c Ginger

## 2018-08-28 ENCOUNTER — Encounter (HOSPITAL_COMMUNITY): Payer: Self-pay

## 2018-08-28 ENCOUNTER — Other Ambulatory Visit (HOSPITAL_COMMUNITY): Payer: Self-pay

## 2018-08-28 DIAGNOSIS — I5032 Chronic diastolic (congestive) heart failure: Secondary | ICD-10-CM

## 2018-08-28 NOTE — Progress Notes (Signed)
Bp: 160/100, pulse: 58, resp: 18, spo2: 96, todays weight: 235 lbs  [name] today states been feeling pretty good.  Been walking today, no complaints of shortness of breath.  He denies chest pain, headaches or dizziness.  He states been weighing and staying within 3 lbs.  Lungs are clear.  Abdomen does not feel tight.  Swelling down in extremities, has some plus 1 edema.  Appetite been good, tries to watch sodium enriched foods and watches fluids. He has a place to stay right now.  Appears to be in good spirits today.  He states has transportation now from a guy to his appts instead of the bus.   He is making his appts at his doctors.  Will continue to visit weekly for heart failure.   [place] [phone]

## 2018-09-10 ENCOUNTER — Other Ambulatory Visit (HOSPITAL_COMMUNITY): Payer: Self-pay

## 2018-09-10 ENCOUNTER — Encounter (HOSPITAL_COMMUNITY): Payer: Self-pay

## 2018-09-10 NOTE — Progress Notes (Signed)
Bp: 162/78, pulse: 64, resp: 18, spo2: 97, last weight: 235 todays: 238 lbs  He states doing well.  Denies chest pain, shortness of breath, headaches occasionally, or dizziness.  Denies feeling tight in abdomen, swelling down in extremities.  He states appetite has been good, does not add salt to diet.  He states been weighing everyday.  He has all his all meds and meds were verified.  He has a house he is renting with 3 other people.  Lungs clear.  Will continue to visit weekly to educate on heart failure.   [place] [phone]

## 2018-09-25 ENCOUNTER — Ambulatory Visit: Payer: Medicare Other | Admitting: Gastroenterology

## 2018-09-25 NOTE — Progress Notes (Deleted)
Summary of history : He is here today to see me to follow-up for  hepatitis C genotype 1a, viral load 3.3 million.[name] He was admitted on 03/29/2018 with lower extremity pain edema and weakness. A similar admission in July when he was treated for heart failure. He is homeless.There has been a concern for noncompliance. Presented to the emergency room again on 04/18/2018 with a mechanical fall.  03/29/2018 hepatitis C genotype 1a, viral load 3.3 million. 03/31/2018 creatinine 1.08. HIV negative. Hemoglobin 11.1 with a platelet count of 132. No recent liver imaging.  He has had hepatitis C since 1985 , never been treated. Acquired it by a blood transfusion after a gall bladder surgery , no illegal drugs, does not drink any alcohol. One tattoos professional. Was in the [employer] .   05/08/2018: Immune to hepatitis A, hepatitis B core antibody positive suggesting of prior infection.  Hepatitis B surface antigen negative.  A hepatitis  B surface antibody positive, INR 1.2, did not obtain urine drug screen.  Hb 12.8 g with an MCV of 85 and a platelet count of 96.  Creatinine 1.1.  05/27/2018: F3 F4 fibrosis.  Markedly dilated common bile duct up to 18 mm MRCP required.  Interval history 06/05/2018 to 09/25/2018  08/12/2018: MRCP: Hepatic cirrhosis and findings of portal venous hypertension, prior cholecystectomy and diffuse biliary ductal dilation no evidence of choledocholithiasis or obstructing etiology.  06/05/2018: Urine drug screen negative  Never had a colonoscopy -canceled EGD and colonoscopy which were scheduled at last visit.    [name] is a 68 y.o. y/o male here to follow up  hepatitis C, GT1a, treatment naive, cirrhosis, concern for compliance, was incarcerated till 11/2017. He appears he is spontaneously cleared hepatitis B.  Hepatitis B surface antibody positive.  HIV negative.  F3 F4 fibrosis seen on elastography.   Plan 1. Treatment of t hepatitis C will be discussed after we  obtain EGD and colonoscopy. 2.   Right upper quadrant ultrasound to screen for [place] in 02/14/2019 3. Urine drug screen.LFT 4.

## 2018-09-26 ENCOUNTER — Other Ambulatory Visit (HOSPITAL_COMMUNITY): Payer: Self-pay

## 2018-09-26 ENCOUNTER — Encounter (HOSPITAL_COMMUNITY): Payer: Self-pay

## 2018-09-26 NOTE — Progress Notes (Signed)
Bp: 180/90, pulse: 86, resp: 18, spo2: 96  [name] states been doing well.  He states he stays active.  He has not been weighing.  He states has all his medications except for oxycodone.  He is trying to get it refilled.  He has a place to stay for another month.  His appetite is good.  He has food.  His medication are due when he gets paid now, which works well for him.  He denies chest pain, shortness of breath.  Swelling about his normal.  His bp is running a little high for him, he states has not took his meds today yet, he will take them.  Denies abdominal tightness.  Meds verified, he is aware of what to take, why and how.  Will continue to visit for heart failure.   [place] [phone]

## 2018-10-06 ENCOUNTER — Encounter (HOSPITAL_COMMUNITY): Payer: Self-pay

## 2018-10-06 ENCOUNTER — Other Ambulatory Visit (HOSPITAL_COMMUNITY): Payer: Self-pay

## 2018-10-06 NOTE — Progress Notes (Signed)
Bp: 142/70, pulse: 80, resp: 18, spo2: 98% scales need batteries-he is going to get some  Today Trip states doing well.  He missed his GI appt, gave him number to reschedule.  He states appetite been good, tries to watch his sodium.  He still renting a room where he is happy at the time.  He has new phone number [phone].  He has all his meds at the time except his hydrocodone and he states been doing ok without them.  He will run out of multi vit in a few days til the first. He still working with a service that provides transportation to appts and looking for him a better place to live.  He stays active, asked me about riding a bike, advised him to start off slow then build up to longer rides.  If pressure in chest starts slow down.  He walks a lot to get around town. He sleeps fine he states.  Meds verified.  Lungs sounds clear.  He knows his meds and takes meds out of bottle, he is familiar how to take them.  Will keep visiting for heart failure.   [place] [phone]

## 2018-10-17 ENCOUNTER — Other Ambulatory Visit: Payer: Self-pay

## 2018-10-17 ENCOUNTER — Emergency Department
Admission: EM | Admit: 2018-10-17 | Discharge: 2018-10-18 | Disposition: A | Discharge status: Home / Self Care | Payer: Medicare Other | Attending: Emergency Medicine | Admitting: Emergency Medicine

## 2018-10-17 DIAGNOSIS — J449 Chronic obstructive pulmonary disease, unspecified: Secondary | ICD-10-CM | POA: Diagnosis not present

## 2018-10-17 DIAGNOSIS — I5032 Chronic diastolic (congestive) heart failure: Secondary | ICD-10-CM | POA: Insufficient documentation

## 2018-10-17 DIAGNOSIS — Z79899 Other long term (current) drug therapy: Secondary | ICD-10-CM | POA: Diagnosis not present

## 2018-10-17 DIAGNOSIS — E119 Type 2 diabetes mellitus without complications: Secondary | ICD-10-CM | POA: Insufficient documentation

## 2018-10-17 DIAGNOSIS — G43909 Migraine, unspecified, not intractable, without status migrainosus: Secondary | ICD-10-CM | POA: Diagnosis not present

## 2018-10-17 DIAGNOSIS — Z7984 Long term (current) use of oral hypoglycemic drugs: Secondary | ICD-10-CM | POA: Insufficient documentation

## 2018-10-17 DIAGNOSIS — I110 Hypertensive heart disease with heart failure: Secondary | ICD-10-CM | POA: Insufficient documentation

## 2018-10-17 DIAGNOSIS — Z7982 Long term (current) use of aspirin: Secondary | ICD-10-CM | POA: Insufficient documentation

## 2018-10-17 DIAGNOSIS — R51 Headache: Secondary | ICD-10-CM | POA: Diagnosis present

## 2018-10-17 MED ORDER — DIPHENHYDRAMINE HCL 50 MG/ML IJ SOLN: 12.5000 mg | Freq: Once | INTRAMUSCULAR | Status: DC

## 2018-10-17 MED ORDER — DEXAMETHASONE SODIUM PHOSPHATE 4 MG/ML IJ SOLN
8.0000 mg | Freq: Once | INTRAMUSCULAR | Status: AC
  Administered 2018-10-17: 8 mg via INTRAVENOUS
  Filled 2018-10-17: qty 2

## 2018-10-17 MED ORDER — SODIUM CHLORIDE 0.9 % IV BOLUS
1000.0000 mL | Freq: Once | INTRAVENOUS | Status: AC
  Administered 2018-10-17: 1000 mL via INTRAVENOUS

## 2018-10-17 MED ORDER — PROCHLORPERAZINE EDISYLATE 10 MG/2ML IJ SOLN
10.0000 mg | Freq: Once | INTRAMUSCULAR | Status: AC
  Administered 2018-10-17: 10 mg via INTRAVENOUS
  Filled 2018-10-17: qty 2

## 2018-10-17 MED ORDER — DIPHENHYDRAMINE HCL 50 MG/ML IJ SOLN
6.2500 mg | Freq: Once | INTRAMUSCULAR | Status: AC
  Administered 2018-10-17: 6.5 mg via INTRAVENOUS
  Filled 2018-10-17: qty 1

## 2018-10-17 MED ORDER — HYDROCODONE-ACETAMINOPHEN 5-325 MG PO TABS: 1.0000 | ORAL_TABLET | ORAL | 0 refills | Status: DC | PRN

## 2018-10-17 NOTE — ED Provider Notes (Signed)
[facility] Emergency Department Provider Note   ____________________________________________   First MD Initiated Contact with Patient 10/17/18 2227     (approximate)  I have reviewed the triage vital signs and the nursing notes.   HISTORY  Chief Complaint Migraine   HPI [name] is a 68 y.o. male patient reports he has his usual bed migrainosus very severe 1 but he has had many like this.  Started with a pounding headache on the left side of the head and spread all over the place.  Bright light bothers his eyes.  He does not have any weakness or numbness anywhere.  Past Medical History:  Diagnosis Date  . Asthma   . CHF (congestive heart failure) ([place])   . COPD (chronic obstructive pulmonary disease) ([place])   . Diabetes mellitus without complication ([place])   . Homelessness   . Hypertension   . Migraine   . Obesity   . Sleep apnea     Patient Active Problem List   Diagnosis Date Noted  . HTN (hypertension) 04/17/2018  . DM (diabetes mellitus) ([place]) 04/17/2018  . Lymphedema 04/17/2018  . Chronic diastolic CHF (congestive heart failure) ([place]) 04/07/2018  . Anemia 04/07/2018  . Bilateral leg pain 03/29/2018  . Chest pain 03/20/2018  . Adjustment disorder with mixed anxiety and depressed mood 12/16/2017  . Homelessness 12/16/2017    Past Surgical History:  Procedure Laterality Date  . CARDIAC CATHETERIZATION    . CHOLECYSTECTOMY    . EYE SURGERY    . INNER EAR SURGERY    . NOSE SURGERY      Prior to Admission medications   Medication Sig Start Date End Date Taking? Authorizing Provider  acetaminophen (TYLENOL) 650 MG CR tablet Take 1 tablet (650 mg total) by mouth 2 (two) times daily as needed for pain. 03/31/18   Gouru, Illene Silver, MD  albuterol (PROVENTIL HFA;VENTOLIN HFA) 108 (90 Base) MCG/ACT inhaler Inhale 2 puffs into the lungs every 6 (six) hours as needed for wheezing or shortness of breath. 07/09/18   Rudene Re, MD    aspirin EC 81 MG tablet Take 81 mg by mouth daily.    [provider]  beclomethasone (BECONASE-AQ) 42 MCG/SPRAY nasal spray Place 1 spray into both nostrils 2 (two) times daily. Dose is for each nostril.    [provider]  cetirizine (ZYRTEC) 10 MG tablet TAKE 1 2 (ONE HALF) TABLET BY MOUTH ONCE DAILY 07/21/18   [provider]  Dextromethorphan-guaiFENesin (WAL-TUSSIN COUGH/CHEST DM MAX) 10-200 MG/5ML LIQD Take 10 mLs by mouth every 4 (four) hours as needed.    [provider]  Dextrose, Diabetic Use, (DEXTROSE PO) Take by mouth.    [provider]  diphenhydrAMINE (BENADRYL) 25 MG tablet Take 50 mg by mouth every evening.     [provider]  EPINEPHrine 0.3 MG/0.3ML SOSY Inject 0.3 mLs (0.3 mg total) into the muscle as needed. 06/30/18   Alisa Graff, FNP  furosemide (LASIX) 40 MG tablet Take 2 tablets (80 mg total) by mouth daily. 04/16/18   Alisa Graff, FNP  HYDROcodone-acetaminophen (NORCO) 5-325 MG tablet Take 1 tablet by mouth every 6 (six) hours as needed for moderate pain. Patient not taking: Reported on 09/26/2018 07/29/18   Paulette Blanch, MD  HYDROcodone-acetaminophen [facility]) 5-325 MG tablet Take 1 tablet by mouth every 4 (four) hours as needed for moderate pain. 10/17/18   Nena Polio, MD  lisinopril (PRINIVIL,ZESTRIL) 10 MG tablet Take 1  tablet (10 mg total) by mouth daily. 04/16/18   Alisa Graff, FNP  loperamide (LOPERAMIDE A-D) 2 MG tablet Take 4 mg by mouth as needed.     [provider]  meclizine (ANTIVERT) 25 MG tablet Take 25 mg by mouth 2 (two) times daily.     [provider]  metFORMIN (GLUCOPHAGE-XR) 500 MG 24 hr tablet Take 2 tablets (1,000 mg total) by mouth 2 (two) times daily. 03/31/18   Nicholes Mango, MD  Multiple Vitamin (MULTIVITAMIN WITH MINERALS) TABS tablet Take 1 tablet by mouth daily.    [provider]  omeprazole (PRILOSEC) 40 MG capsule Take 1 capsule (40 mg total) by  mouth AC breakfast. 03/31/18   Gouru, Illene Silver, MD  ondansetron (ZOFRAN ODT) 4 MG disintegrating tablet Take 1 tablet (4 mg total) by mouth every 8 (eight) hours as needed for nausea or vomiting. 07/29/18   Paulette Blanch, MD  PARoxetine (PAXIL) 10 MG tablet TAKE 1 TABLET BY MOUTH ONCE DAILY FOR MOOD 07/21/18   [provider]  potassium chloride (K-DUR) 10 MEQ tablet Take 1 tablet by mouth daily. 07/29/18   [provider]  potassium chloride SA (K-DUR,KLOR-CON) 20 MEQ tablet Take 1 tablet (20 mEq total) by mouth daily. Patient not taking: Reported on 09/10/2018 04/16/18   Alisa Graff, FNP  promethazine (PHENERGAN) 25 MG tablet Take 25 mg by mouth every 6 (six) hours as needed for nausea or vomiting (before transportation).     [provider]  simvastatin (ZOCOR) 40 MG tablet Take 1 tablet (40 mg total) by mouth daily at 6 PM. 04/16/18   Alisa Graff, FNP  tamsulosin (FLOMAX) 0.4 MG CAPS capsule Take 0.4 mg by mouth daily.    [provider]    Allergies Eggs or egg-derived products; Novocain [procaine]; and Sulfa antibiotics  Family History  Problem Relation Age of Onset  . Hypertension Mother   . Heart disease Mother   . Heart failure Maternal Grandmother     Social History Social History   Tobacco Use  . Smoking status: Never Smoker  . Smokeless tobacco: Never Used  Substance Use Topics  . Alcohol use: Never    Frequency: Never  . Drug use: Never    Review of Systems  Constitutional: No fever/chills Eyes: No visual changes. ENT: No sore throat. Cardiovascular: Denies chest pain. Respiratory: Denies shortness of breath. Gastrointestinal: No abdominal pain.   nausea, no vomiting.  No diarrhea.  No constipation. Genitourinary: Negative for dysuria. Musculoskeletal: Negative for back pain. Skin: Negative for rash. Neurological: Negative for focal weakness   ____________________________________________   PHYSICAL EXAM:  VITAL  SIGNS: ED Triage Vitals [10/17/18 1919]  Enc Vitals Group     BP (!) 161/55     Pulse Rate 61     Resp 16     Temp 98.4 F (36.9 C)     Temp Source Oral     SpO2 98 %     Weight 220 lb (99.8 kg)     Height 5\' 6"  (1.676 m)     Head Circumference      Peak Flow      Pain Score 10     Pain Loc      Pain Edu?      Excl. in [place]?     Constitutional: Alert and oriented. Well appearing and in no acute distress dark room with sunglasses. Head: Atraumatic. Nose: No congestion/rhinnorhea. Mouth/Throat: Mucous membranes are moist.  Oropharynx non-erythematous.  Neck: No stridor.  Cardiovascular: Normal rate, regular rhythm. Grossly normal heart sounds.  Good peripheral circulation. Respiratory: Normal respiratory effort.  No retractions. Lungs CTAB. Gastrointestinal: Soft and nontender. No distention. No abdominal bruits. No CVA tenderness. Musculoskeletal: No lower extremity tenderness nor edema.  Neurologic:  Normal speech and language. No gross focal neurologic deficits are appreciated.  Skin:  Skin is warm, dry and intact. No rash noted. Psychiatric: Mood and affect are normal. Speech and behavior are normal.  ____________________________________________   LABS (all labs ordered are listed, but only abnormal results are displayed)  Labs Reviewed - No data to display ____________________________________________  EKG   ____________________________________________  RADIOLOGY  ED MD interpretation:    Official radiology report(s): No results found.  ____________________________________________   PROCEDURES  Procedure(s) performed:  Procedures  Critical Care performed:   ____________________________________________   INITIAL IMPRESSION / ASSESSMENT AND PLAN / ED COURSE  Patient reports his migraine is gone and wants to go home.          ____________________________________________   FINAL CLINICAL IMPRESSION(S) / ED DIAGNOSES  Final diagnoses:   Migraine without status migrainosus, not intractable, unspecified migraine type     ED Discharge Orders         Ordered    HYDROcodone-acetaminophen (NORCO) 5-325 MG tablet  Every 4 hours PRN     10/17/18 2346           Note:  This document was prepared using Dragon voice recognition software and may include unintentional dictation errors.    Nena Polio, MD 10/18/18 [phone]

## 2018-10-17 NOTE — ED Triage Notes (Signed)
Pt states "I have a migraine". Pt states history of migraines "my whole life" and this migraine is "typical" for him. Pt states tylenol is not helping pain. Pt complains of photophobia, sensitivity to sound and nausea. perrl 31mm and brisk, no nuchal rigidity noted.

## 2018-10-17 NOTE — Discharge Instructions (Signed)
Please return for any further problems.  The hydrocodone can make you sleepy and constipated.  Do not drive on it.

## 2018-10-17 NOTE — ED Notes (Signed)
ED Provider at bedside.  Pt reports migraine HA, left to right pain, "worst one in a while", "I ran out of my headache medicine",   Pt without neurological deficits, NAD, A&O x4,

## 2018-10-28 ENCOUNTER — Other Ambulatory Visit (HOSPITAL_COMMUNITY): Payer: Self-pay

## 2018-10-28 ENCOUNTER — Encounter (HOSPITAL_COMMUNITY): Payer: Self-pay

## 2018-10-28 NOTE — Progress Notes (Signed)
Bp: 162/80, pulse: 68, resp: 18, spo2: 97 weight today: 229.  [name] states been doing well.  Deneis cough, shortness of breath, headaches, dizziness or chest pain.  He states remains active.  He walks to bus stops and to stores.  Appetite good, he watches high sodium foods most of time. He has been losing weight. Lungs are clear, normal swelling in lower extremitites, abdomen does not feel tight.  Meds verified and he states has all meds.  Went over up coming appts, and he is aware of HF clinic appt coming up.  Will continue to visit for heart failure.   [place] [phone]

## 2018-11-12 ENCOUNTER — Other Ambulatory Visit (HOSPITAL_COMMUNITY): Payer: Self-pay

## 2018-11-12 ENCOUNTER — Encounter (HOSPITAL_COMMUNITY): Payer: Self-pay

## 2018-11-12 NOTE — Progress Notes (Signed)
Bp: 160/80, pulse: 60, resp: 18, spo2: 97  Has not been weighing  Today brought water to [place], he advised me that he has no power, water or sewage.  He states people he is living with had it cut off due to amount.  He states they have a drop cord from neighbor and able to heat food.  He states he has food from [employer] but had no water.  He states the other place is just about ready to move in to.  He states been doing well.  He has no extremity swelling, shortness of breath, chest pain, headaches or dizziness.  His lungs are clear.  He states stays active by walking.  Verified meds, he states he is running out of over counter meds but has all his prescription meds. He is aware of appt with HF clinic this week on Friday and time.  Will continue to visit for heart failure.  Reminded him to start weighing again.   [place] [phone]

## 2018-11-13 NOTE — Progress Notes (Signed)
Patient ID: [name], male    DOB: March 19, 1951, 68 y.o.   MRN: [id number]  HPI  [name] is a 68 y/o male with a history of DM, HTN, COPD, migraines, obstructive sleep apnea and chronic heart failure.  Echo report from 03/20/18 reviewed and showed an EF of 60-65% along with mild/moderate Drew and a mildly elevated PA pressure of 42 mm Hg.   Was in the ED 10/17/2018 due to migraine headache where he was treated and released.   He presents today for a follow-up visit with a chief complaint of minimal fatigue upon moderate exertion. He describes this as chronic in nature having been present for several years. He has associated cough, migraines and difficulty sleeping along with this. He denies any dizziness, abdominal distention, palpitations, pedal edema, chest pain, shortness of breath or weight gain. Says that he walked 2 miles already this morning and feels "great".   Past Medical History:  Diagnosis Date  . Asthma   . CHF (congestive heart failure) ([place])   . COPD (chronic obstructive pulmonary disease) ([place])   . Diabetes mellitus without complication ([place])   . Homelessness   . Hypertension   . Migraine   . Obesity   . Sleep apnea    Past Surgical History:  Procedure Laterality Date  . CARDIAC CATHETERIZATION    . CHOLECYSTECTOMY    . EYE SURGERY    . INNER EAR SURGERY    . NOSE SURGERY     Family History  Problem Relation Age of Onset  . Hypertension Mother   . Heart disease Mother   . Heart failure Maternal Grandmother    Social History   Tobacco Use  . Smoking status: Never Smoker  . Smokeless tobacco: Never Used  Substance Use Topics  . Alcohol use: Never    Frequency: Never   Allergies  Allergen Reactions  . Eggs Or Egg-Derived Products Nausea And Vomiting  . Novocain [Procaine]   . Sulfa Antibiotics    Prior to Admission medications   Medication Sig Start Date End Date Taking? Authorizing Provider  acetaminophen (TYLENOL) 650 MG CR tablet Take 1  tablet (650 mg total) by mouth 2 (two) times daily as needed for pain. 03/31/18  Yes Gouru, Illene Silver, MD  albuterol (PROVENTIL HFA;VENTOLIN HFA) 108 (90 Base) MCG/ACT inhaler Inhale 2 puffs into the lungs every 6 (six) hours as needed for wheezing or shortness of breath. 07/09/18  Yes Alfred Levins, [place], MD  aspirin EC 81 MG tablet Take 81 mg by mouth daily.   Yes [provider]  beclomethasone (BECONASE-AQ) 42 MCG/SPRAY nasal spray Place 1 spray into both nostrils 2 (two) times daily. Dose is for each nostril.   Yes [provider]  cetirizine (ZYRTEC) 10 MG tablet TAKE 1 2 (ONE HALF) TABLET BY MOUTH ONCE DAILY 07/21/18  Yes [provider]  Dextromethorphan-guaiFENesin (WAL-TUSSIN COUGH/CHEST DM MAX) 10-200 MG/5ML LIQD Take 10 mLs by mouth every 4 (four) hours as needed.   Yes [provider]  Dextrose, Diabetic Use, (DEXTROSE PO) Take by mouth.   Yes [provider]  diphenhydrAMINE (BENADRYL) 25 MG tablet Take 50 mg by mouth every evening.    Yes [provider]  EPINEPHrine 0.3 MG/0.3ML SOSY Inject 0.3 mLs (0.3 mg total) into the muscle as needed. 06/30/18  Yes Kynzlie Hilleary, [name] A, FNP  lisinopril (PRINIVIL,ZESTRIL) 10 MG tablet Take 1 tablet (10 mg total) by mouth daily. 04/16/18  Yes Alisa Graff, FNP  loperamide (LOPERAMIDE  A-D) 2 MG tablet Take 4 mg by mouth as needed.    Yes [provider]  meclizine (ANTIVERT) 25 MG tablet Take 25 mg by mouth 2 (two) times daily.    Yes [provider]  metFORMIN (GLUCOPHAGE-XR) 500 MG 24 hr tablet Take 2 tablets (1,000 mg total) by mouth 2 (two) times daily. 03/31/18  Yes Gouru, Illene Silver, MD  Multiple Vitamin (MULTIVITAMIN WITH MINERALS) TABS tablet Take 1 tablet by mouth daily.   Yes [provider]  omeprazole (PRILOSEC) 40 MG capsule Take 1 capsule (40 mg total) by mouth AC breakfast. 03/31/18  Yes Gouru, [place], MD  ondansetron (ZOFRAN ODT) 4 MG disintegrating tablet Take 1 tablet (4 mg  total) by mouth every 8 (eight) hours as needed for nausea or vomiting. 07/29/18  Yes Paulette Blanch, MD  PARoxetine (PAXIL) 10 MG tablet TAKE 1 TABLET BY MOUTH ONCE DAILY FOR MOOD 07/21/18  Yes [provider]  potassium chloride (K-DUR) 10 MEQ tablet Take 1 tablet by mouth daily. 07/29/18  Yes [provider]  promethazine (PHENERGAN) 25 MG tablet Take 25 mg by mouth every 6 (six) hours as needed for nausea or vomiting (before transportation).    Yes [provider]  simvastatin (ZOCOR) 40 MG tablet Take 1 tablet (40 mg total) by mouth daily at 6 PM. 04/16/18  Yes Darylene Price A, FNP  tamsulosin (FLOMAX) 0.4 MG CAPS capsule Take 0.4 mg by mouth daily.   Yes [provider]  furosemide (LASIX) 40 MG tablet Take 2 tablets (80 mg total) by mouth daily. 04/16/18   Alisa Graff, FNP    Review of Systems  Constitutional: Positive for fatigue. Negative for appetite change.  HENT: Negative for congestion, postnasal drip and sore throat.   Eyes: Negative.   Respiratory: Positive for cough (dry cough at time). Negative for shortness of breath.   Cardiovascular: Negative for chest pain, palpitations and leg swelling.  Gastrointestinal: Negative for abdominal distention and abdominal pain.  Endocrine: Negative.   Genitourinary: Negative.   Musculoskeletal: Positive for back pain and neck pain.  Skin: Negative.   Allergic/Immunologic: Negative.   Neurological: Positive for headaches (migraines couple days of week). Negative for dizziness and light-headedness.  Hematological: Negative for adenopathy. Does not bruise/bleed easily.  Psychiatric/Behavioral: Positive for sleep disturbance (not sleeping well). Negative for dysphoric mood. The patient is not nervous/anxious.    Vitals:   11/14/18 0830  BP: 135/67  Pulse: (!) 52  Resp: 18  Temp: 98.7 F (37.1 C)  SpO2: 99%  Weight: 227 lb 6 oz (103.1 kg)  Height: 5\' 6"  (1.676 m)   Wt Readings from Last 3 Encounters:   11/14/18 227 lb 6 oz (103.1 kg)  10/28/18 220 lb (99.8 kg)  10/17/18 220 lb (99.8 kg)   Lab Results  Component Value Date   CREATININE 0.95 08/01/2018   CREATININE 1.02 07/09/2018   CREATININE 0.94 07/03/2018    Physical Exam Vitals signs and nursing note reviewed.  Constitutional:      Appearance: He is well-developed.  HENT:     Head: Normocephalic and atraumatic.  Neck:     Musculoskeletal: Normal range of motion and neck supple.     Vascular: No JVD.  Cardiovascular:     Rate and Rhythm: Regular rhythm. Bradycardia present.  Pulmonary:     Effort: Pulmonary effort is normal. No respiratory distress.     Breath sounds: No wheezing or rales.  Abdominal:     General: There  is no distension.     Palpations: Abdomen is soft.  Musculoskeletal:        General: No tenderness.     Right lower leg: He exhibits no tenderness. No edema.     Left lower leg: He exhibits no tenderness. No edema.  Skin:    General: Skin is warm and dry.  Neurological:     Mental Status: He is alert and oriented to person, place, and time.  Psychiatric:        Behavior: Behavior normal.    Assessment & Plan:  1: Chronic heart failure with preserved ejection fraction- - NYHA class II - euvolemic today - weighing daily; reminded to call for an overnight weight gain of >2 pounds or a weekly weight gain of >5 pounds - weight down 5 pounds from last visit here 3 months ago - not adding salt to his food ; When he doesn't have a place to live, he tends to eat saltier foods; currently living with a friend  - discussed the importance of closely following a 2000mg  sodium diet as much as he can - does walk ~ 3 miles daily - saw cardiology [name]) 04/07/18 - BNP 06/23/18 was 62.0 - participating in paramedicine program - has not received his flu vaccine yet - PharmD reconciled medications with patient  2: HTN- - BP looks good today  - saw PCP at [facility] 04/19/18  - BMP 08/01/18  reviewed and showed sodium 141, potassium 3.8, creatinine 0.95 and GFR >60  3: DM- - A1c 03/20/18 was 5.5%  4: Lymphedema- - stage 2 - resolved - does walk for transportation/ uses [employer] for longer trips   Patient did not bring his medications nor a list. Each medication was verbally reviewed with the patient and he was encouraged to bring the bottles to every visit to confirm accuracy of list.  Return in 4 months or sooner for any questions/problems before then.

## 2018-11-14 ENCOUNTER — Ambulatory Visit: Payer: Medicare Other | Attending: Family | Admitting: Family

## 2018-11-14 ENCOUNTER — Encounter: Payer: Self-pay | Admitting: Family

## 2018-11-14 ENCOUNTER — Other Ambulatory Visit: Payer: Self-pay

## 2018-11-14 ENCOUNTER — Encounter: Payer: Self-pay | Admitting: Pharmacist

## 2018-11-14 VITALS — BP 135/67 | HR 52 | Temp 98.7°F | Resp 18 | Ht 66.0 in | Wt 227.4 lb

## 2018-11-14 DIAGNOSIS — J449 Chronic obstructive pulmonary disease, unspecified: Secondary | ICD-10-CM | POA: Insufficient documentation

## 2018-11-14 DIAGNOSIS — I10 Essential (primary) hypertension: Secondary | ICD-10-CM

## 2018-11-14 DIAGNOSIS — Z79899 Other long term (current) drug therapy: Secondary | ICD-10-CM | POA: Insufficient documentation

## 2018-11-14 DIAGNOSIS — E119 Type 2 diabetes mellitus without complications: Secondary | ICD-10-CM | POA: Diagnosis not present

## 2018-11-14 DIAGNOSIS — Z6836 Body mass index (BMI) 36.0-36.9, adult: Secondary | ICD-10-CM | POA: Diagnosis not present

## 2018-11-14 DIAGNOSIS — I890 Lymphedema, not elsewhere classified: Secondary | ICD-10-CM

## 2018-11-14 DIAGNOSIS — Z8249 Family history of ischemic heart disease and other diseases of the circulatory system: Secondary | ICD-10-CM | POA: Insufficient documentation

## 2018-11-14 DIAGNOSIS — I110 Hypertensive heart disease with heart failure: Secondary | ICD-10-CM | POA: Diagnosis not present

## 2018-11-14 DIAGNOSIS — Z7984 Long term (current) use of oral hypoglycemic drugs: Secondary | ICD-10-CM | POA: Diagnosis not present

## 2018-11-14 DIAGNOSIS — Z882 Allergy status to sulfonamides status: Secondary | ICD-10-CM | POA: Insufficient documentation

## 2018-11-14 DIAGNOSIS — E669 Obesity, unspecified: Secondary | ICD-10-CM | POA: Insufficient documentation

## 2018-11-14 DIAGNOSIS — Z7982 Long term (current) use of aspirin: Secondary | ICD-10-CM | POA: Diagnosis not present

## 2018-11-14 DIAGNOSIS — Z91012 Allergy to eggs: Secondary | ICD-10-CM | POA: Insufficient documentation

## 2018-11-14 DIAGNOSIS — G4733 Obstructive sleep apnea (adult) (pediatric): Secondary | ICD-10-CM | POA: Diagnosis not present

## 2018-11-14 DIAGNOSIS — Z9049 Acquired absence of other specified parts of digestive tract: Secondary | ICD-10-CM | POA: Insufficient documentation

## 2018-11-14 DIAGNOSIS — I5032 Chronic diastolic (congestive) heart failure: Secondary | ICD-10-CM | POA: Diagnosis present

## 2018-11-14 DIAGNOSIS — Z590 Homelessness: Secondary | ICD-10-CM | POA: Insufficient documentation

## 2018-11-14 DIAGNOSIS — Z884 Allergy status to anesthetic agent status: Secondary | ICD-10-CM | POA: Insufficient documentation

## 2018-11-14 NOTE — Patient Instructions (Signed)
Continue weighing daily and call for an overnight weight gain of > 2 pounds or a weekly weight gain of >5 pounds. 

## 2018-11-14 NOTE — Progress Notes (Signed)
[facility] - PHARMACIST COUNSELING NOTE  ADHERENCE ASSESSMENT  Adherence strategy: has a routine. Can not find a pill box big enough to fit all of his medications.   Do you ever forget to take your medication? [] Yes (1) [x] No (0)  Do you ever skip doses due to side effects? [] Yes (1) [x] No (0)  Do you have trouble affording your medicines? [x] Yes (1) [] No (0)  Are you ever unable to pick up your medication due to transportation difficulties? [] Yes (1) [x] No (0)  Do you ever stop taking your medications because you don't believe they are helping? [] Yes (1) [x] No (0)  Total score _0______    Recommendations given to patient about increasing adherence: None. Patient reports good adherence with current strategy. Does report cost issues some months but that he has 3 insurances that make all of his medications less than $20.  Guideline-Directed Medical Therapy/Evidence Based Medicine    ACE/ARB/ARNI: lisinopril 10 mg daily   Beta Blocker: none   Aldosterone Antagonist: none Diuretic: furosemide 80 mg daily    SUBJECTIVE   HPI: Here for follow up visit. Denies any concerns about his medications.  Past Medical History:  Diagnosis Date  . Asthma   . CHF (congestive heart failure) ([place])   . COPD (chronic obstructive pulmonary disease) ([place])   . Diabetes mellitus without complication ([place])   . Homelessness   . Hypertension   . Migraine   . Obesity   . Sleep apnea         OBJECTIVE    Vital signs: HR 52, BP 135/67, weight (pounds) 227.6  ECHO: Date 03/20/18, EF 60-65%    BMP Latest Ref Rng & Units 08/01/2018 07/09/2018 07/03/2018  Glucose 70 - 99 mg/dL 91 116(H) 177(H)  BUN 8 - 23 mg/dL 13 13 12   Creatinine 0.61 - 1.24 mg/dL 0.95 1.02 0.94  Sodium 135 - 145 mmol/L 141 141 141  Potassium 3.5 - 5.1 mmol/L 3.8 3.8 3.6  Chloride 98 - 111 mmol/L 109 108 111  CO2 22 - 32 mmol/L 26 27 26   Calcium 8.9 - 10.3 mg/dL 9.1 8.9 8.8(L)     ASSESSMENT 68 year old male with HFpEF. Says that his medications are working well for him. He does not have any medication concerns today. BP today WNL.   PLAN Continue current medications. Patient reports good compliance with regimen.    Time spent: 10 minutes  [place], Pharm.D. 11/14/2018 8:54 AM    Current Outpatient Medications:  .  acetaminophen (TYLENOL) 650 MG CR tablet, Take 1 tablet (650 mg total) by mouth 2 (two) times daily as needed for pain., Disp: , Rfl:  .  albuterol (PROVENTIL HFA;VENTOLIN HFA) 108 (90 Base) MCG/ACT inhaler, Inhale 2 puffs into the lungs every 6 (six) hours as needed for wheezing or shortness of breath., Disp: 1 Inhaler, Rfl: 2 .  aspirin EC 81 MG tablet, Take 81 mg by mouth daily., Disp: , Rfl:  .  beclomethasone (BECONASE-AQ) 42 MCG/SPRAY nasal spray, Place 1 spray into both nostrils 2 (two) times daily. Dose is for each nostril., Disp: , Rfl:  .  cetirizine (ZYRTEC) 10 MG tablet, TAKE 1 2 (ONE HALF) TABLET BY MOUTH ONCE DAILY, Disp: , Rfl: 5 .  Dextromethorphan-guaiFENesin (WAL-TUSSIN COUGH/CHEST DM MAX) 10-200 MG/5ML LIQD, Take 10 mLs by mouth every 4 (four) hours as needed., Disp: , Rfl:  .  Dextrose, Diabetic Use, (DEXTROSE PO), Take by mouth., Disp: , Rfl:  .  diphenhydrAMINE (BENADRYL) 25 MG tablet, Take 50 mg by mouth every evening. , Disp: , Rfl:  .  EPINEPHrine 0.3 MG/0.3ML SOSY, Inject 0.3 mLs (0.3 mg total) into the muscle as needed., Disp: 1 each, Rfl: 5 .  furosemide (LASIX) 40 MG tablet, Take 2 tablets (80 mg total) by mouth daily., Disp: 60 tablet, Rfl: 5 .  lisinopril (PRINIVIL,ZESTRIL) 10 MG tablet, Take 1 tablet (10 mg total) by mouth daily., Disp: 30 tablet, Rfl: 5 .  loperamide (LOPERAMIDE A-D) 2 MG tablet, Take 4 mg by mouth as needed. , Disp: , Rfl:  .  meclizine (ANTIVERT) 25 MG tablet, Take 25 mg by mouth 2 (two) times daily. , Disp: , Rfl:  .  metFORMIN (GLUCOPHAGE-XR) 500 MG 24 hr tablet, Take 2 tablets (1,000  mg total) by mouth 2 (two) times daily., Disp: 60 tablet, Rfl: 1 .  Multiple Vitamin (MULTIVITAMIN WITH MINERALS) TABS tablet, Take 1 tablet by mouth daily., Disp: , Rfl:  .  omeprazole (PRILOSEC) 40 MG capsule, Take 1 capsule (40 mg total) by mouth AC breakfast., Disp: 30 capsule, Rfl: 0 .  ondansetron (ZOFRAN ODT) 4 MG disintegrating tablet, Take 1 tablet (4 mg total) by mouth every 8 (eight) hours as needed for nausea or vomiting., Disp: 20 tablet, Rfl: 0 .  PARoxetine (PAXIL) 10 MG tablet, TAKE 1 TABLET BY MOUTH ONCE DAILY FOR MOOD, Disp: , Rfl: 1 .  potassium chloride (K-DUR) 10 MEQ tablet, Take 1 tablet by mouth daily., Disp: , Rfl:  .  promethazine (PHENERGAN) 25 MG tablet, Take 25 mg by mouth every 6 (six) hours as needed for nausea or vomiting (before transportation). , Disp: , Rfl:  .  simvastatin (ZOCOR) 40 MG tablet, Take 1 tablet (40 mg total) by mouth daily at 6 PM., Disp: 30 tablet, Rfl: 5 .  tamsulosin (FLOMAX) 0.4 MG CAPS capsule, Take 0.4 mg by mouth daily., Disp: , Rfl:    COUNSELING POINTS/CLINICAL PEARLS  Lisinopril (Goal: 20 to 40 mg once daily)  This drug may cause nausea, vomiting, dizziness, headache, or angioedema of face, lips, throat, or intestines.  Instruct patient to report signs/symptoms of hypotension, or a persistent cough.  Advise patient against sudden discontinuation of drug. Furosemide  Drug causes sun-sensitivity. Advise patient to use sunscreen and avoid tanning beds. Patient should avoid activities requiring coordination until drug effects are realized, as drug may cause dizziness, vertigo, or blurred vision. This drug may cause hyperglycemia, hyperuricemia, constipation, diarrhea, loss of appetite, nausea, vomiting, purpuric disorder, cramps, spasticity, asthenia, headache, paresthesia, or scaling eczema. Instruct patient to report unusual bleeding/bruising or signs/symptoms of hypotension, infection, pancreatitis, or ototoxicity (tinnitus, hearing  impairment). Advise patient to report signs/symptoms of a severe skin reactions (flu-like symptoms, spreading red rash, or skin/mucous membrane blistering) or erythema multiforme. Instruct patient to eat high-potassium foods during drug therapy, as directed by healthcare professional.  Patient should not drink alcohol while taking this drug.   DRUGS TO AVOID IN HEART FAILURE  Drug or Class Mechanism  Analgesics . NSAIDs . COX-2 inhibitors . Glucocorticoids  Sodium and water retention, increased systemic vascular resistance, decreased response to diuretics   Diabetes Medications . Metformin . Thiazolidinediones o Rosiglitazone (Avandia) o Pioglitazone (Actos) . DPP4 Inhibitors o Saxagliptin (Onglyza) o Sitagliptin (Januvia)   Lactic acidosis Possible calcium channel blockade   Unknown  Antiarrhythmics . Class I  o Flecainide o Disopyramide . Class III o Sotalol . Other o Dronedarone  Negative inotrope, proarrhythmic   Proarrhythmic, beta blockade  Negative inotrope  Antihypertensives . Alpha Blockers o Doxazosin . Calcium Channel Blockers o Diltiazem o Verapamil o Nifedipine . Central Alpha Adrenergics o Moxonidine . Peripheral Vasodilators o Minoxidil  Increases renin and aldosterone  Negative inotrope    Possible sympathetic withdrawal  Unknown  Anti-infective . Itraconazole . Amphotericin B  Negative inotrope Unknown  Hematologic . Anagrelide . Cilostazol   Possible inhibition of PD IV Inhibition of PD III causing arrhythmias  Neurologic/Psychiatric . Stimulants . Anti-Seizure Drugs o Carbamazepine o Pregabalin . Antidepressants o Tricyclics o Citalopram . Parkinsons o Bromocriptine o Pergolide o Pramipexole . Antipsychotics o Clozapine . Antimigraine o Ergotamine o Methysergide . Appetite suppressants . Bipolar o Lithium  Peripheral alpha and beta agonist activity  Negative inotrope and chronotrope Calcium channel  blockade  Negative inotrope, proarrhythmic Dose-dependent QT prolongation  Excessive serotonin activity/valvular damage Excessive serotonin activity/valvular damage Unknown  IgE mediated hypersensitivy, calcium channel blockade  Excessive serotonin activity/valvular damage Excessive serotonin activity/valvular damage Valvular damage  Direct myofibrillar degeneration, adrenergic stimulation  Antimalarials . Chloroquine . Hydroxychloroquine Intracellular inhibition of lysosomal enzymes  Urologic Agents . Alpha Blockers o Doxazosin o Prazosin o Tamsulosin o Terazosin  Increased renin and aldosterone  Adapted from Page RL, et al. "Drugs That May Cause or Exacerbate Heart Failure: A Scientific Statement from the [place]." Circulation 2016; 226:J33-L45. DOI: 10.1161/CIR.0000000000000426   MEDICATION ADHERENCES TIPS AND STRATEGIES 1. Taking medication as prescribed improves patient outcomes in heart failure (reduces hospitalizations, improves symptoms, increases survival) 2. Side effects of medications can be managed by decreasing doses, switching agents, stopping drugs, or adding additional therapy. Please let someone in the [facility] know if you have having bothersome side effects so we can modify your regimen. Do not alter your medication regimen without talking to [place].  3. Medication reminders can help patients remember to take drugs on time. If you are missing or forgetting doses you can try linking behaviors, using pill boxes, or an electronic reminder like an alarm on your phone or an app. Some people can also get automated phone calls as medication reminders.

## 2018-11-19 ENCOUNTER — Other Ambulatory Visit (HOSPITAL_COMMUNITY): Payer: Self-pay

## 2018-11-19 ENCOUNTER — Encounter (HOSPITAL_COMMUNITY): Payer: Self-pay

## 2018-11-19 ENCOUNTER — Telehealth (HOSPITAL_COMMUNITY): Payer: Self-pay

## 2018-11-19 NOTE — Telephone Encounter (Signed)
[name] contacted me this am and advised he is sick and dehydrated. He states needs water, he has none since his power has been cut.  Took him water last week and advised him to refill the bottles at neighbor or some where, he states he knows of no where to fill them.  Contacted Social worker Kennyth Lose and asked for advise, she states get him some water and keep advising him to refill the bottles.  He states he will have to go to er for fluids if can not get water.  Will take him some.  [place] [phone]

## 2018-11-19 NOTE — Progress Notes (Signed)
Today was a visit to bring him some water.  The power where he rents a room has been shut off for a few weeks.  He states has an extension cord running to neighbors to use microwave.  He states has all his prescription meds and has food.  He is out of water.  Took water to him last week.  He states had diarrhea and vomited last night.  He is worried about dehydration.  Took him 3 3 liter bottles.  Took his temp it is 101.2.  Advised him to hydrate and take tylenol for fever.  If feels worst to go to ER.  He denies chest pain, shortness of breath, headaches or dizziness.  He states urinating ok. He states just feels bad.  Denies sore throat.  Kept my distance due to coronavirus.  He appears well nourished, skin color pink.  He has a jacket on and we meet outside, cool today, unable to obtain a bp.  Will contact him by phone later to check on him.   [place] [phone]

## 2018-11-28 ENCOUNTER — Telehealth: Payer: Self-pay | Admitting: Cardiovascular Disease

## 2018-11-28 NOTE — Telephone Encounter (Signed)
Virtual Visit Pre-Appointment Phone Call  Steps For Call:  1. Confirm consent - "In the setting of the current Covid19 crisis, you are scheduled for a (phone or video) visit with your provider on (date) at (time).  Just as we do with many in-office visits, in order for you to participate in this visit, we must obtain consent.  If you'd like, I can send this to your mychart (if signed up) or email for you to review.  Otherwise, I can obtain your verbal consent now.  All virtual visits are billed to your insurance company just like a normal visit would be.  By agreeing to a virtual visit, we'd like you to understand that the technology does not allow for your provider to perform an examination, and thus may limit your provider's ability to fully assess your condition.  Finally, though the technology is pretty good, we cannot assure that it will always work on either your or our end, and in the setting of a video visit, we may have to convert it to a phone-only visit.  In either situation, we cannot ensure that we have a secure connection.  Are you willing to proceed?"  2. Give patient instructions for WebEx download to smartphone as below if video visit  3. Advise patient to be prepared with any vital sign or heart rhythm information, their current medicines, and a piece of paper and pen handy for any instructions they may receive the day of their visit  4. Inform patient they will receive a phone call 15 minutes prior to their appointment time (may be from unknown caller ID) so they should be prepared to answer  5. Confirm that appointment type is correct in Epic appointment notes (video vs telephone)    TELEPHONE CALL NOTE  [name] has been deemed a candidate for a follow-up tele-health visit to limit community exposure during the Covid-19 pandemic. I spoke with the patient via phone to ensure availability of phone/video source, confirm preferred email & phone number, and discuss  instructions and expectations.  I reminded [name] to be prepared with any vital sign and/or heart rhythm information that could potentially be obtained via home monitoring, at the time of his visit. I reminded [name] to expect a phone call at the time of his visit if his visit.  Did the patient verbally acknowledge consent to treatment? YES  Clarisse Gouge 11/28/2018 4:20 PM   DOWNLOADING THE WEBEX SOFTWARE TO SMARTPHONE  - If Apple, go to [employer] and type in WebEx in the search bar. [place] [employer], the blue/green circle. The app is free but as with any other app downloads, their phone may require them to verify saved payment information or Apple password. The patient does NOT have to create an account.  - If Android, ask patient to go to [employer] and type in WebEx in the search bar. [place] [employer], the blue/green circle. The app is free but as with any other app downloads, their phone may require them to verify saved payment information or Android password. The patient does NOT have to create an account.   CONSENT FOR TELE-HEALTH VISIT - PLEASE REVIEW  I hereby voluntarily request, consent and authorize [facility] and its employed or contracted physicians, physician assistants, nurse practitioners or other licensed health care professionals (the Practitioner), to provide me with telemedicine health care services (the Services") as deemed necessary by the treating Practitioner. I  acknowledge and consent to receive the Services by the Practitioner via telemedicine. I understand that the telemedicine visit will involve communicating with the Practitioner through live audiovisual communication technology and the disclosure of certain medical information by electronic transmission. I acknowledge that I have been given the opportunity to request an in-person assessment or other available alternative prior to the telemedicine visit and am  voluntarily participating in the telemedicine visit.  I understand that I have the right to withhold or withdraw my consent to the use of telemedicine in the course of my care at any time, without affecting my right to future care or treatment, and that the Practitioner or I may terminate the telemedicine visit at any time. I understand that I have the right to inspect all information obtained and/or recorded in the course of the telemedicine visit and may receive copies of available information for a reasonable fee.  I understand that some of the potential risks of receiving the Services via telemedicine include:   Delay or interruption in medical evaluation due to technological equipment failure or disruption;  Information transmitted may not be sufficient (e.g. poor resolution of images) to allow for appropriate medical decision making by the Practitioner; and/or   In rare instances, security protocols could fail, causing a breach of personal health information.  Furthermore, I acknowledge that it is my responsibility to provide information about my medical history, conditions and care that is complete and accurate to the best of my ability. I acknowledge that Practitioner's advice, recommendations, and/or decision may be based on factors not within their control, such as incomplete or inaccurate data provided by me or distortions of diagnostic images or specimens that may result from electronic transmissions. I understand that the practice of medicine is not an exact science and that Practitioner makes no warranties or guarantees regarding treatment outcomes. I acknowledge that I will receive a copy of this consent concurrently upon execution via email to the email address I last provided but may also request a printed copy by calling the office of [facility].    I understand that my insurance will be billed for this visit.   I have read or had this consent read to me.  I understand the  contents of this consent, which adequately explains the benefits and risks of the Services being provided via telemedicine.   I have been provided ample opportunity to ask questions regarding this consent and the Services and have had my questions answered to my satisfaction.  I give my informed consent for the services to be provided through the use of telemedicine in my medical care  By participating in this telemedicine visit I agree to the above.

## 2018-12-01 NOTE — Progress Notes (Signed)
Telephone Visit     Evaluation Performed:  Follow-up visit  This visit type was conducted due to national recommendations for restrictions regarding the COVID-19 Pandemic (e.g. social distancing).  This format is felt to be most appropriate for this patient at this time.  All issues noted in this document were discussed and addressed.  No physical exam was performed (except for noted visual exam findings with Telehealth visits).  See MyChart message from today for the patient's consent to telehealth for [facility].  Date:  12/01/2018   ID:  [name], DOB 04-18-1951, MRN [id number]  Patient Location:  [place] [address]   Provider location:   [facility] [place] office  PCP:  Letta Median, MD  Cardiologist:  Patsy Baltimore  Chief Complaint:   Edema/lymphedema   History of Present Illness:    [name] is a 68 y.o. male who presents via audio/video conferencing for a telehealth visit today.   The patient does not symptoms concerning for COVID-19 infection (fever, chills, cough, or new SHORTNESS OF BREATH).   Patient has a past medical history of CHF, COPD,  DM2,  HTN,  migraine disorder,  obesity,  OSA not compliant with CPAP,  homelessness, and recent incarcerationwho is admitted with ongoing edema, leg pain, andSOB.  He presents for follow-up of his chronic diastolic CHF  Has a visting EMT, paramedicine probgram  Weight down Walks a lot, 4-5 miles daily Watching diet  Still with "1 in of edema"  Weight was 254 pounds 03/2018 Weight 223.1 today  No SOB, no chest pain Lasix 80 mg at night  Dr. Rebeca Alert, [facility] Did labs one months ago  BP 140/60  Previously Homeless, now has a place Previously unable to get into various places given his previous criminal record Previously Got most of his food from shelters, high salt intake He does report high fluid intake, mouth is always dry  Previous  labs Albumin 2.8 Chest x-ray all atherosclerosis, otherwise no acute process   Prior CV studies:   The following studies were reviewed today:  Echocardiogram reviewed previously by myself showing normal LV function  Mildly elevated right heart pressures 40 mmHg Mild to moderate mitral valve regurgitation Medical management recommended   Past Medical History:  Diagnosis Date  . Asthma   . CHF (congestive heart failure) ([place])   . COPD (chronic obstructive pulmonary disease) ([place])   . Diabetes mellitus without complication ([place])   . Homelessness   . Hypertension   . Migraine   . Obesity   . Sleep apnea    Past Surgical History:  Procedure Laterality Date  . CARDIAC CATHETERIZATION    . CHOLECYSTECTOMY    . EYE SURGERY    . INNER EAR SURGERY    . NOSE SURGERY       No outpatient medications have been marked as taking for the 12/02/18 encounter (Appointment) with Minna Merritts, MD.     Allergies:   Eggs or egg-derived products; Novocain [procaine]; and Sulfa antibiotics   Social History   Tobacco Use  . Smoking status: Never Smoker  . Smokeless tobacco: Never Used  Substance Use Topics  . Alcohol use: Never    Frequency: Never  . Drug use: Never     Current Outpatient Medications on File Prior to Visit  Medication Sig Dispense Refill  . acetaminophen (TYLENOL) 650 MG CR tablet Take 1 tablet (650 mg total) by mouth 2 (two)  times daily as needed for pain.    Marland Kitchen albuterol (PROVENTIL HFA;VENTOLIN HFA) 108 (90 Base) MCG/ACT inhaler Inhale 2 puffs into the lungs every 6 (six) hours as needed for wheezing or shortness of breath. 1 Inhaler 2  . aspirin EC 81 MG tablet Take 81 mg by mouth daily.    . beclomethasone (BECONASE-AQ) 42 MCG/SPRAY nasal spray Place 1 spray into both nostrils 2 (two) times daily. Dose is for each nostril.    . cetirizine (ZYRTEC) 10 MG tablet TAKE 1 2 (ONE HALF) TABLET BY MOUTH ONCE DAILY  5  . Dextromethorphan-guaiFENesin (WAL-TUSSIN  COUGH/CHEST DM MAX) 10-200 MG/5ML LIQD Take 10 mLs by mouth every 4 (four) hours as needed.    Marland Kitchen Dextrose, Diabetic Use, (DEXTROSE PO) Take by mouth.    . diphenhydrAMINE (BENADRYL) 25 MG tablet Take 50 mg by mouth every evening.     Marland Kitchen EPINEPHrine 0.3 MG/0.3ML SOSY Inject 0.3 mLs (0.3 mg total) into the muscle as needed. 1 each 5  . furosemide (LASIX) 40 MG tablet Take 2 tablets (80 mg total) by mouth daily. 60 tablet 5  . lisinopril (PRINIVIL,ZESTRIL) 10 MG tablet Take 1 tablet (10 mg total) by mouth daily. 30 tablet 5  . loperamide (LOPERAMIDE A-D) 2 MG tablet Take 4 mg by mouth as needed.     . meclizine (ANTIVERT) 25 MG tablet Take 25 mg by mouth 2 (two) times daily.     . metFORMIN (GLUCOPHAGE-XR) 500 MG 24 hr tablet Take 2 tablets (1,000 mg total) by mouth 2 (two) times daily. 60 tablet 1  . Multiple Vitamin (MULTIVITAMIN WITH MINERALS) TABS tablet Take 1 tablet by mouth daily.    Marland Kitchen omeprazole (PRILOSEC) 40 MG capsule Take 1 capsule (40 mg total) by mouth AC breakfast. 30 capsule 0  . ondansetron (ZOFRAN ODT) 4 MG disintegrating tablet Take 1 tablet (4 mg total) by mouth every 8 (eight) hours as needed for nausea or vomiting. 20 tablet 0  . PARoxetine (PAXIL) 10 MG tablet TAKE 1 TABLET BY MOUTH ONCE DAILY FOR MOOD  1  . potassium chloride (K-DUR) 10 MEQ tablet Take 1 tablet by mouth daily.    . promethazine (PHENERGAN) 25 MG tablet Take 25 mg by mouth every 6 (six) hours as needed for nausea or vomiting (before transportation).     . simvastatin (ZOCOR) 40 MG tablet Take 1 tablet (40 mg total) by mouth daily at 6 PM. 30 tablet 5  . tamsulosin (FLOMAX) 0.4 MG CAPS capsule Take 0.4 mg by mouth daily.     No current facility-administered medications on file prior to visit.      Family Hx: The patient's family history includes Heart disease in his mother; Heart failure in his maternal grandmother; Hypertension in his mother.  ROS:   Please see the history of present illness.    Review of  Systems  Constitutional: Negative.   Respiratory: Negative.   Cardiovascular: Negative.   Gastrointestinal: Negative.   Musculoskeletal: Negative.   Neurological: Negative.   Psychiatric/Behavioral: Negative.   All other systems reviewed and are negative.    Labs/Other Tests and Data Reviewed:    Recent Labs: 03/20/2018: TSH 1.522 03/28/2018: Magnesium 2.0 07/03/2018: ALT 30 07/09/2018: B Natriuretic Peptide 323.0 08/01/2018: BUN 13; Creatinine, Ser 0.95; Hemoglobin 10.4; Platelets 109; Potassium 3.8; Sodium 141   Recent Lipid Panel No results found for: CHOL, TRIG, HDL, CHOLHDL, LDLCALC, LDLDIRECT  Wt Readings from Last 3 Encounters:  11/14/18 227 lb 6 oz (103.1 kg)  10/28/18 220 lb (99.8 kg)  10/17/18 220 lb (99.8 kg)     Exam:    Vital Signs: Vital signs as detailed above in HPI  Well nourished, well developed male in no acute distress. Constitutional:  oriented to person, place, and time. No distress.    ASSESSMENT & PLAN:    1) chest pain No symptoms Aortic atherosclerosis seen on chest x-ray Stress test with no ischemia 03/21/2018 No further testing  2) chronic diastolic CHF No significant shortness of breath Sx better Still with edema but stable  3) anemia Exacerbated by poor diet Likely contributing to edema  4)lymphedema: Does not have compression hose or lymphedema compression hose Previous script did work   COVID-19 Education: The signs and symptoms of COVID-19 were discussed with the patient and how to seek care for testing (follow up with PCP or arrange E-visit).  The importance of social distancing was discussed today.  Patient Risk:   After full review of this patients clinical status, I feel that they are at least moderate risk at this time.  Time:   Today, I have spent 25 minutes with the patient with telehealth technology discussing CHF education, edema .     Medication Adjustments/Labs and Tests Ordered: Current medicines are  reviewed at length with the patient today.  Concerns regarding medicines are outlined above.   Tests Ordered: No tests ordered   Medication Changes: No changes made   Disposition: Follow-up in 6 months   Signed, Ida Rogue, MD  12/01/2018 5:35 PM    [facility] [address] [place] [address], [place], [place] [address]

## 2018-12-02 ENCOUNTER — Other Ambulatory Visit: Payer: Self-pay | Admitting: Cardiovascular Disease

## 2018-12-02 ENCOUNTER — Other Ambulatory Visit: Payer: Self-pay

## 2018-12-02 ENCOUNTER — Telehealth (INDEPENDENT_AMBULATORY_CARE_PROVIDER_SITE_OTHER): Payer: Medicare Other | Admitting: Cardiovascular Disease

## 2018-12-02 DIAGNOSIS — I5032 Chronic diastolic (congestive) heart failure: Secondary | ICD-10-CM

## 2018-12-02 DIAGNOSIS — R0602 Shortness of breath: Secondary | ICD-10-CM | POA: Diagnosis not present

## 2018-12-02 DIAGNOSIS — E119 Type 2 diabetes mellitus without complications: Secondary | ICD-10-CM

## 2018-12-02 DIAGNOSIS — I10 Essential (primary) hypertension: Secondary | ICD-10-CM

## 2018-12-02 DIAGNOSIS — I890 Lymphedema, not elsewhere classified: Secondary | ICD-10-CM | POA: Diagnosis not present

## 2018-12-02 MED ORDER — EPINEPHRINE 0.3 MG/0.3ML IJ SOAJ: 0.3000 mg | INTRAMUSCULAR | 1 refills | Status: DC | PRN

## 2018-12-02 NOTE — Patient Instructions (Signed)

## 2018-12-09 ENCOUNTER — Telehealth (HOSPITAL_COMMUNITY): Payer: Self-pay

## 2018-12-09 NOTE — Telephone Encounter (Signed)
Had a telephone visit with [name] today.  He has moved into a better place, he has electricity and water.  He is renting a room but at a better place.  He is closer to places that he will need to get to.  He states been feeling good.  He walks a lot.  He has food.  He watches his sodium when he can, due to getting food from food pantries hard to watch sodium.  Did ask if he had a full size refrigerator but he does not, considered him for Moms meals, but he has no way of storing 14 meals.  He denies shortness of breath, headaches, dizziness or chest pain.  He has all his medications and meds were verified.  He is being cautious about staying out of the public right now.  He states swelling is down, his weight is staying the same he says.  Advised him to contact me if any problems.  Will continue to do phone visits at this time.   [place] [phone]

## 2018-12-24 ENCOUNTER — Telehealth (HOSPITAL_COMMUNITY): Payer: Self-pay

## 2018-12-24 NOTE — Telephone Encounter (Signed)
Weight: 235 lbs, He states been doing well.  Likes new place he is living.  He has all the necessities he needs.  He has plenty of food and water now.  He has power, heat, air conditioner and a refrigerator.  He states has all his medications, meds were verified.  He walks everyday.  He states has some swelling in lower extremities but goes down at night.  Will continue to visit by phone at this time. He is aware to contact me if he has a problem and I can visit at home.   [place] [phone]

## 2018-12-27 IMAGING — CR DG LUMBAR SPINE COMPLETE 4+V
5 series · 5 of 5 positions shown · non-contrast
Comparison: None.

CLINICAL DATA: Fall off curb.  Pain.  Initial encounter.

EXAM:
LUMBAR SPINE - COMPLETE 4+ VIEW

[l-spine ap]
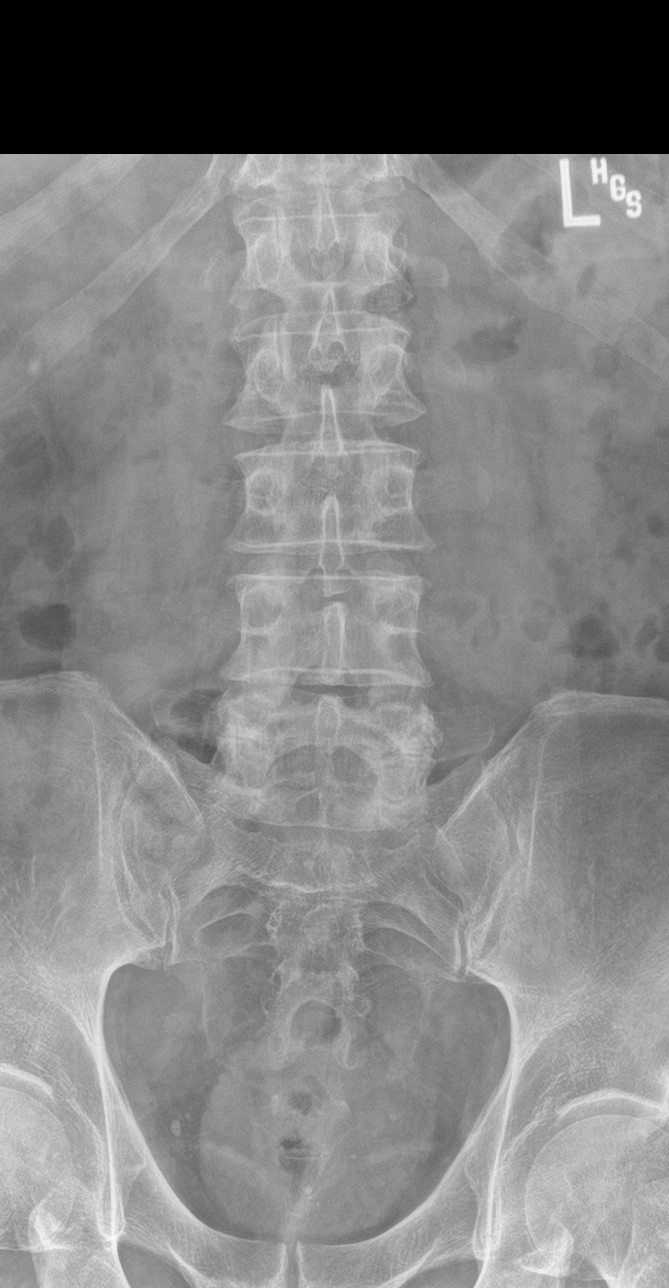

[l-spine obl (1 of 2)]
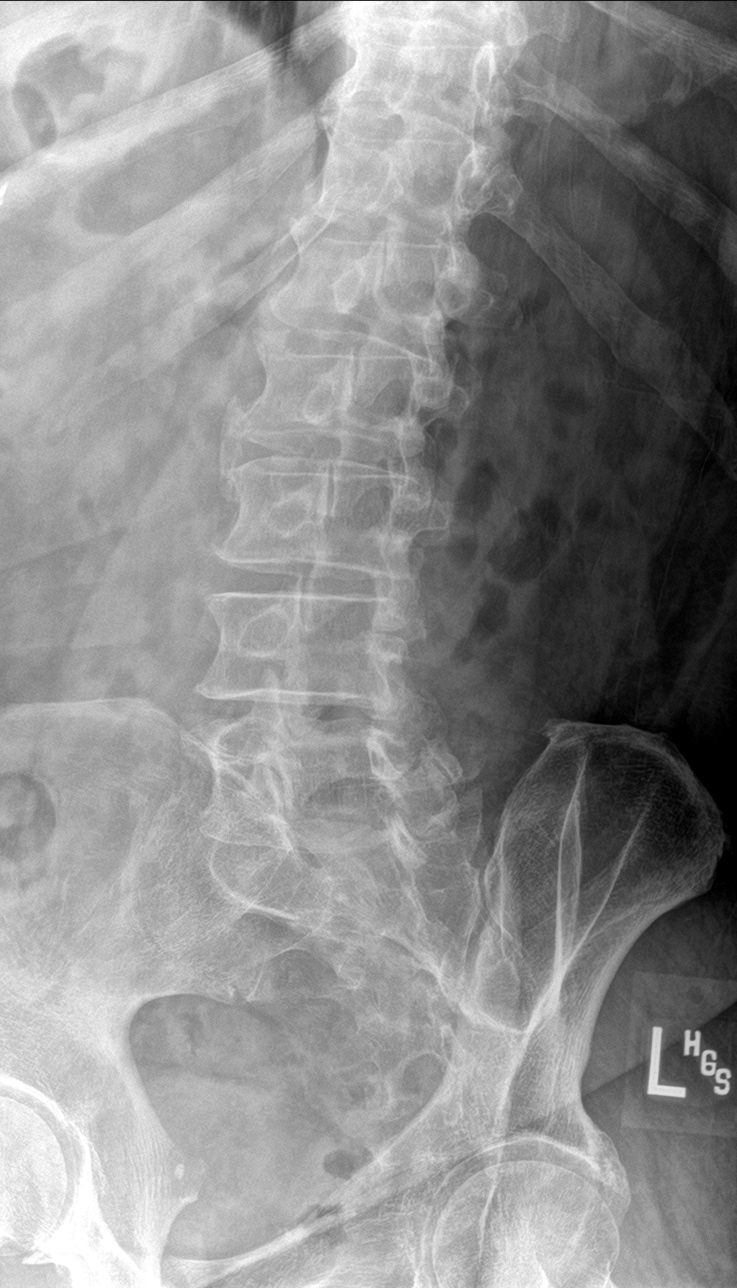

[l-spine obl (2 of 2)]
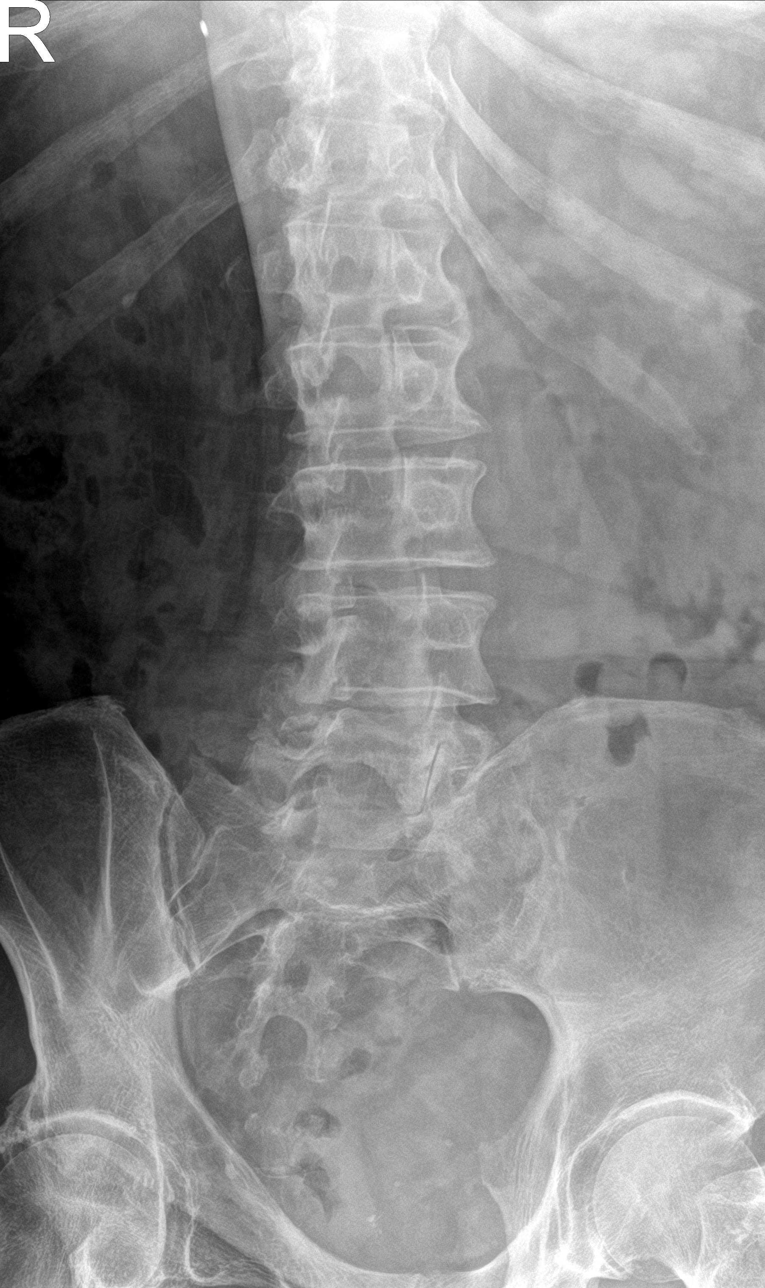

[l-spine lat]
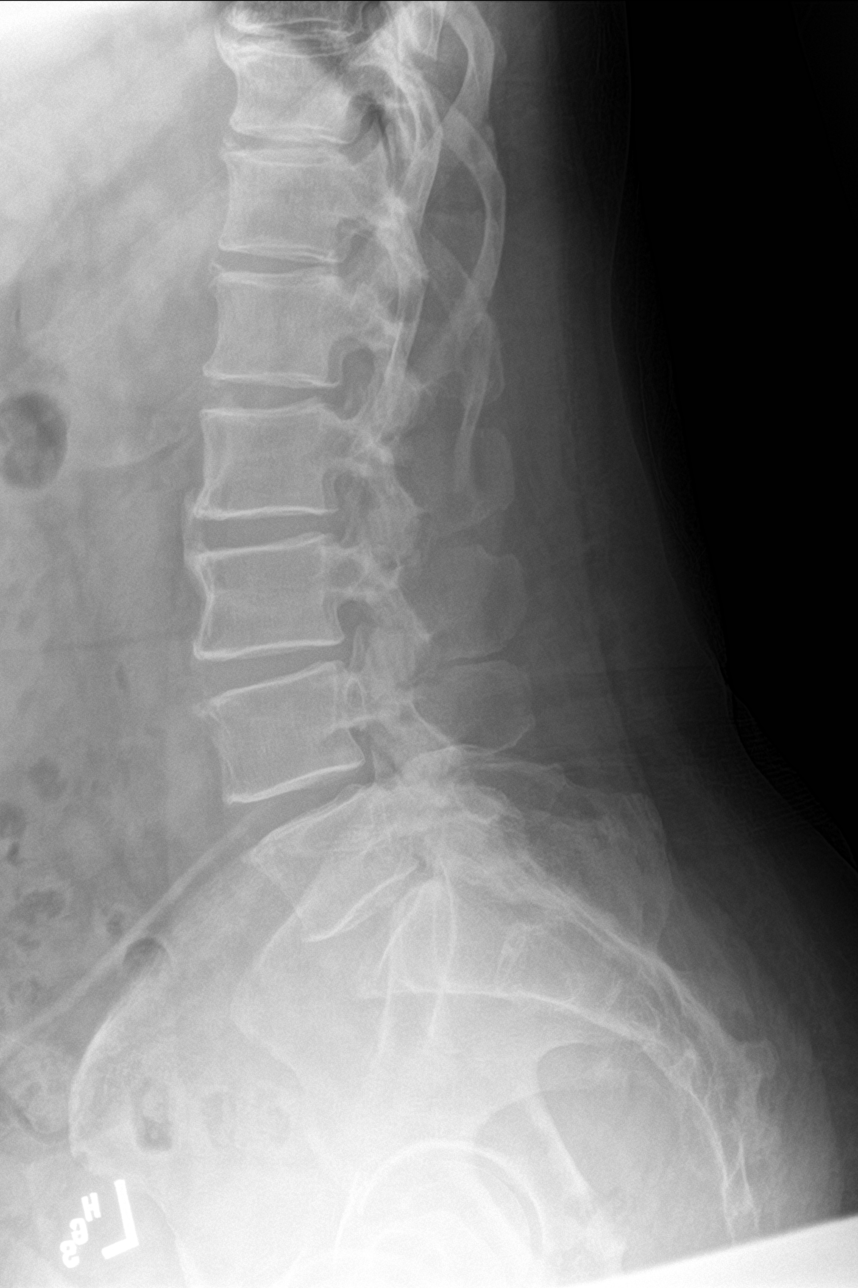

[l-spine spot]
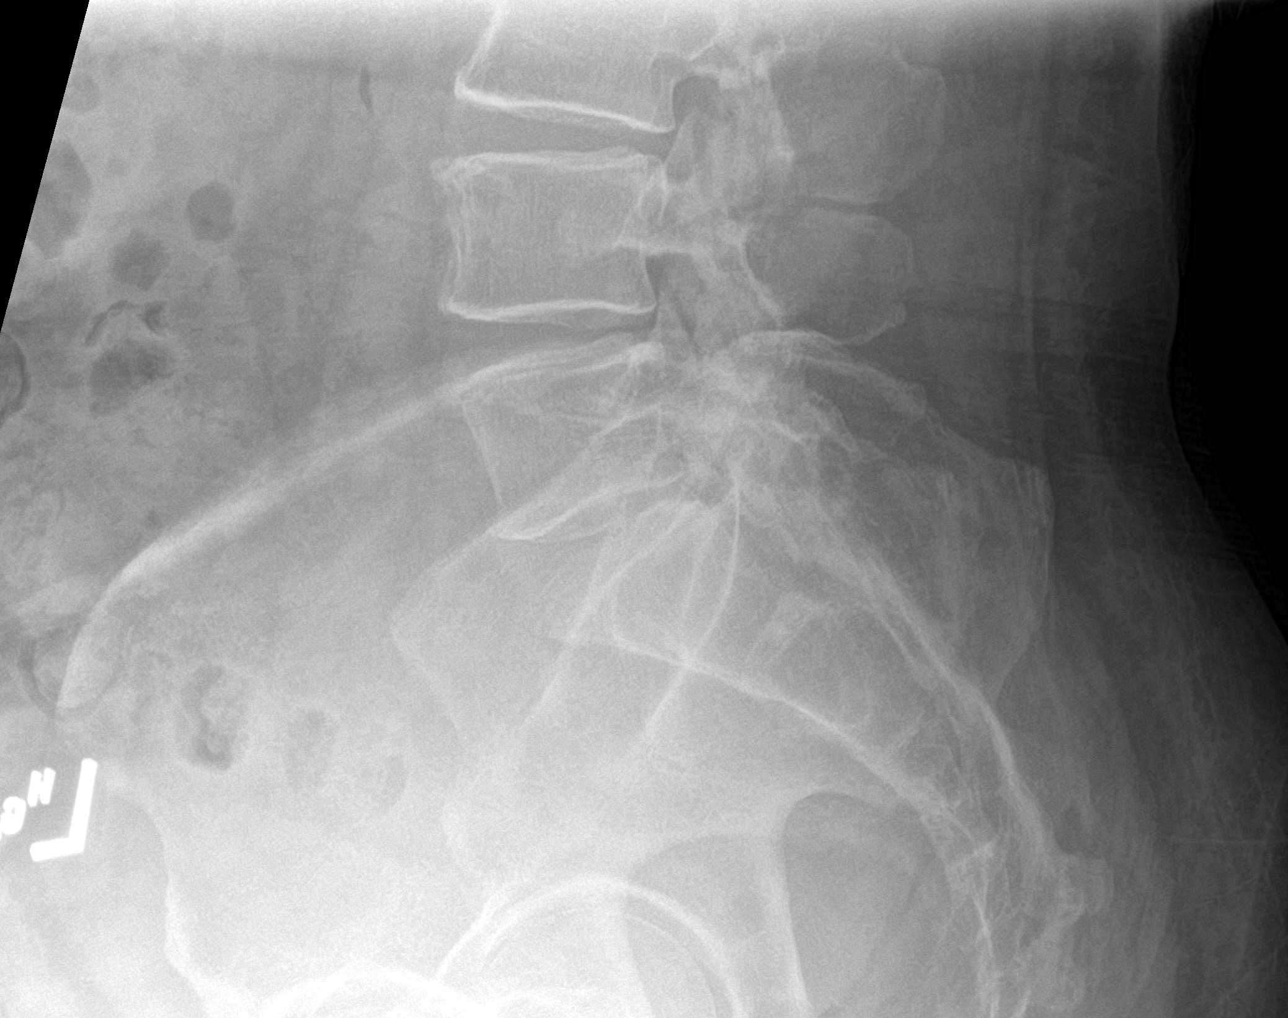

[5 of 5 positions shown; findings below may reference images not displayed]

FINDINGS: Five non rib-bearing lumbar type vertebral bodies are present.
Vertebral body heights and alignment are normal. There is chronic
loss of disc height at L5-S1 with advanced facet hypertrophy
contributing to the foraminal narrowing bilaterally. Foraminal
narrowing is suspected at L4-5. No acute fracture or traumatic
subluxation is present.
IMPRESSION: 1. No acute abnormality.
2. Degenerative changes are most severe at L5-S1 and L4-5.

## 2019-01-05 ENCOUNTER — Telehealth (HOSPITAL_COMMUNITY): Payer: Self-pay

## 2019-01-05 NOTE — Telephone Encounter (Signed)
Today was a telephone visit with [name].  He states walks daily.  Doing well.  He has food and necessities he needs.  He states has all his medications except for Aspirin, he ran out.  He states will get some next time at store.  He still living at same place, he likes it there.  Denies chest pain, shortness of breath, headaches or dizziness.  States swelling is down in extremities.  He is not weighing, he states will start. Denies abdominal tightness.  Will continue by telephone at this time unless there is a problem.  He is aware I will make a home visit if there is a need.   [place] [phone]

## 2019-02-04 ENCOUNTER — Telehealth (HOSPITAL_COMMUNITY): Payer: Self-pay

## 2019-02-04 NOTE — Telephone Encounter (Signed)
Today had a telephone visit with [name].  He states doing well, has 90 days refills on prescription and has them all right now.  He states a little swelling but not bad.  He states walks when he can, hot this week.  Tries to watch what he eats and drinks when affordable.  He denies chest pain, shortness of breath, headaches or dizziness.  He is aware of appts.  Advised him if has any problems to call me and I can do a home visit.  He using precautions when out in public with this corona virus.  Will continue to visit for heart failure and medication compliance.   [place] [phone]

## 2019-02-17 ENCOUNTER — Telehealth (HOSPITAL_COMMUNITY): Payer: Self-pay

## 2019-02-17 NOTE — Telephone Encounter (Signed)
[name] contacted me to asked if I could change his address, he is going to cancel his PO box since he has a permanent address now.  He states doing well, he has all his medications.  He states had a migraine last week but was able to rest and it went away without going to ED.  He is aware of appts coming up.  Medications verified.  He denies swelling, he states gaining weight from inactivity and eating.  He denies chest pain, shortness of breath and dizziness.  He states has everything he needs.  Will continue to visit for heart failure.   [place] [phone]

## 2019-02-17 NOTE — Telephone Encounter (Signed)
251 lbs

## 2019-03-09 ENCOUNTER — Other Ambulatory Visit (HOSPITAL_COMMUNITY): Payer: Self-pay

## 2019-03-09 ENCOUNTER — Encounter (HOSPITAL_COMMUNITY): Payer: Self-pay

## 2019-03-09 NOTE — Progress Notes (Signed)
Today was a telephone visit with [place].  He states been doing ok, he tries to stay active, he walks where ever needs to go.  He has all his needs, his medications.  Meds verified.  Eating well, tries to watch high sodium foods.  Watches amount of fluids.  States weight is maintaining.  He is aware of appts coming up.  Will continue to visit for heart failure.   [place] [phone]

## 2019-03-18 NOTE — Progress Notes (Signed)
Patient ID: [name], male    DOB: May 10, 1951, 68 y.o.   MRN: [id number]  HPI  [name] is a 68 y/o male with a history of DM, HTN, COPD, migraines, obstructive sleep apnea and chronic heart failure.  Echo report from 03/20/18 reviewed and showed an EF of 60-65% along with mild/moderate Drew and a mildly elevated PA pressure of 42 mm Hg.   Was in the ED 10/17/2018 due to migraine headache where he was treated and released.   He presents today for a follow-up visit with a chief complaint of minimal shortness of breath upon moderate exertion. He says that this has worsened since his weight has risen due to inactivity and increased eating. He has associated fatigue, cough, palpitations, intermittent chest pain, pedal edema, light-headedness, difficulty sleeping and gradual weight gain along with this. He denies any abdominal swelling.   Past Medical History:  Diagnosis Date  . Asthma   . CHF (congestive heart failure) ([place])   . COPD (chronic obstructive pulmonary disease) ([place])   . Diabetes mellitus without complication ([place])   . Homelessness   . Hypertension   . Migraine   . Obesity   . Sleep apnea    Past Surgical History:  Procedure Laterality Date  . CARDIAC CATHETERIZATION    . CHOLECYSTECTOMY    . EYE SURGERY    . INNER EAR SURGERY    . NOSE SURGERY     Family History  Problem Relation Age of Onset  . Hypertension Mother   . Heart disease Mother   . Heart failure Maternal Grandmother    Social History   Tobacco Use  . Smoking status: Never Smoker  . Smokeless tobacco: Never Used  Substance Use Topics  . Alcohol use: Never    Frequency: Never   Allergies  Allergen Reactions  . Eggs Or Egg-Derived Products Nausea And Vomiting  . Novocain [Procaine]   . Sulfa Antibiotics    Prior to Admission medications   Medication Sig Start Date End Date Taking? Authorizing Provider  acetaminophen (TYLENOL) 650 MG CR tablet Take 1 tablet (650 mg total) by mouth 2 (two)  times daily as needed for pain. 03/31/18  Yes Gouru, Illene Silver, MD  albuterol (PROVENTIL HFA;VENTOLIN HFA) 108 (90 Base) MCG/ACT inhaler Inhale 2 puffs into the lungs every 6 (six) hours as needed for wheezing or shortness of breath. 07/09/18  Yes Alfred Levins, [place], MD  aspirin EC 81 MG tablet Take 81 mg by mouth daily.   Yes [provider]  beclomethasone (BECONASE-AQ) 42 MCG/SPRAY nasal spray Place 1 spray into both nostrils 2 (two) times daily. Dose is for each nostril.   Yes [provider]  cetirizine (ZYRTEC) 10 MG tablet 10 mg daily.  07/21/18  Yes [provider]  Dextromethorphan-guaiFENesin (WAL-TUSSIN COUGH/CHEST DM MAX) 10-200 MG/5ML LIQD Take 10 mLs by mouth every 4 (four) hours as needed.   Yes [provider]  Dextrose, Diabetic Use, (DEXTROSE PO) Take by mouth.   Yes [provider]  EPINEPHrine 0.3 mg/0.3 mL IJ SOAJ injection Inject 0.3 mLs (0.3 mg total) into the muscle as needed. 12/02/18  Yes Gollan, Kathlene November, MD  furosemide (LASIX) 40 MG tablet Take 2 tablets (80 mg total) by mouth daily. 04/16/18  Yes , [name] A, FNP  lisinopril (PRINIVIL,ZESTRIL) 10 MG tablet Take 1 tablet (10 mg total) by mouth daily. 04/16/18  Yes , [name] A, FNP  loperamide (LOPERAMIDE A-D) 2 MG tablet Take 4 mg by mouth  as needed.    Yes [provider]  meclizine (ANTIVERT) 25 MG tablet Take 25 mg by mouth 2 (two) times daily.    Yes [provider]  metFORMIN (GLUCOPHAGE-XR) 500 MG 24 hr tablet Take 2 tablets (1,000 mg total) by mouth 2 (two) times daily. 03/31/18  Yes Gouru, Illene Silver, MD  Multiple Vitamin (MULTIVITAMIN WITH MINERALS) TABS tablet Take 1 tablet by mouth daily.   Yes [provider]  omeprazole (PRILOSEC) 40 MG capsule Take 1 capsule (40 mg total) by mouth AC breakfast. 03/31/18  Yes Gouru, [place]una, MD  ondansetron (ZOFRAN ODT) 4 MG disintegrating tablet Take 1 tablet (4 mg total) by mouth every 8 (eight) hours as needed for  nausea or vomiting. 07/29/18  Yes Paulette Blanch, MD  PARoxetine (PAXIL) 10 MG tablet TAKE 1 TABLET BY MOUTH ONCE DAILY FOR MOOD 07/21/18  Yes [provider]  potassium chloride (K-DUR) 10 MEQ tablet Take 1 tablet by mouth daily. 07/29/18  Yes [provider]  promethazine (PHENERGAN) 25 MG tablet Take 25 mg by mouth every 6 (six) hours as needed for nausea or vomiting (before transportation).    Yes [provider]  simvastatin (ZOCOR) 40 MG tablet Take 1 tablet (40 mg total) by mouth daily at 6 PM. 04/16/18  Yes Darylene Price A, FNP  tamsulosin (FLOMAX) 0.4 MG CAPS capsule Take 0.4 mg by mouth daily.   Yes [provider]     Review of Systems  Constitutional: Positive for fatigue. Negative for appetite change.  HENT: Negative for congestion, postnasal drip and sore throat.   Eyes: Negative.   Respiratory: Positive for cough (dry cough at time) and shortness of breath.   Cardiovascular: Positive for chest pain (infrequent), palpitations (on occasion) and leg swelling (left foot).  Gastrointestinal: Negative for abdominal distention and abdominal pain.  Endocrine: Negative.   Genitourinary: Negative.   Musculoskeletal: Positive for arthralgias (right leg pain), back pain and neck pain.  Skin: Negative.   Allergic/Immunologic: Negative.   Neurological: Positive for light-headedness (sometimes) and headaches (migraines couple days of week). Negative for dizziness.  Hematological: Negative for adenopathy. Does not bruise/bleed easily.  Psychiatric/Behavioral: Positive for sleep disturbance (not sleeping well). Negative for dysphoric mood. The patient is not nervous/anxious.    Vitals:   03/20/19 1007  BP: 136/78  Pulse: (!) 53  Resp: 18  SpO2: 98%  Weight: 252 lb 2 oz (114.4 kg)  Height: 5\' 6"  (1.676 m)   Wt Readings from Last 3 Encounters:  03/20/19 252 lb 2 oz (114.4 kg)  03/09/19 243 lb (110.2 kg)  11/14/18 227 lb 6 oz (103.1 kg)   Lab Results   Component Value Date   CREATININE 0.95 08/01/2018   CREATININE 1.02 07/09/2018   CREATININE 0.94 07/03/2018    Physical Exam Vitals signs and nursing note reviewed.  Constitutional:      Appearance: He is well-developed.  HENT:     Head: Normocephalic and atraumatic.  Neck:     Musculoskeletal: Normal range of motion and neck supple.     Vascular: No JVD.  Cardiovascular:     Rate and Rhythm: Regular rhythm. Bradycardia present.  Pulmonary:     Effort: Pulmonary effort is normal. No respiratory distress.     Breath sounds: No wheezing or rales.  Abdominal:     General: There is no distension.     Palpations: Abdomen is soft.  Musculoskeletal:        General: No tenderness.  Right lower leg: He exhibits no tenderness. Edema (trace pitting) present.     Left lower leg: He exhibits no tenderness. Edema (trace pitting) present.  Skin:    General: Skin is warm and dry.  Neurological:     Mental Status: He is alert and oriented to person, place, and time.  Psychiatric:        Behavior: Behavior normal.    Assessment & Plan:  1: Chronic heart failure with preserved ejection fraction- - NYHA class II - euvolemic today - weighing daily; reminded to call for an overnight weight gain of >2 pounds or a weekly weight gain of >5 pounds - weight up 25 pounds from last visit here 4 months ago - admits to not being very active and eating more over these last few months (COVID); encouraged him to increase his activity at home by walking in place on commercials - he has noticed that as his weight has risen, his shortness of breath has worsened - not adding salt to his food  - discussed the importance of closely following a 2000mg  sodium diet as much as he can - hasn't been walking as much lately as he now has transportation that picks him up at his door so he doesn't have to walk to the bus stop - had telemedicine visit with cardiology [name]) 12/02/2018 - BNP 06/23/18 was 62.0 -  participating in paramedicine program  2: HTN- - BP looks good today - saw PCP at [facility] 04/19/18  - BMP 08/01/18 reviewed and showed sodium 141, potassium 3.8, creatinine 0.95 and GFR >60  3: DM- - A1c 03/20/18 was 5.5% - glucose at home today was 91   Patient did not bring his medications nor a list. Each medication was verbally reviewed with the patient and he was encouraged to bring the bottles to every visit to confirm accuracy of list.  Return in 4 months or sooner for any questions/problems before then.

## 2019-03-20 ENCOUNTER — Other Ambulatory Visit: Payer: Self-pay

## 2019-03-20 ENCOUNTER — Encounter: Payer: Self-pay | Admitting: Family

## 2019-03-20 ENCOUNTER — Ambulatory Visit: Payer: Medicare Other | Attending: Family | Admitting: Family

## 2019-03-20 VITALS — BP 136/78 | HR 53 | Resp 18 | Ht 66.0 in | Wt 252.1 lb

## 2019-03-20 DIAGNOSIS — G4733 Obstructive sleep apnea (adult) (pediatric): Secondary | ICD-10-CM | POA: Insufficient documentation

## 2019-03-20 DIAGNOSIS — Z79899 Other long term (current) drug therapy: Secondary | ICD-10-CM | POA: Insufficient documentation

## 2019-03-20 DIAGNOSIS — E669 Obesity, unspecified: Secondary | ICD-10-CM | POA: Insufficient documentation

## 2019-03-20 DIAGNOSIS — Z7982 Long term (current) use of aspirin: Secondary | ICD-10-CM | POA: Diagnosis not present

## 2019-03-20 DIAGNOSIS — Z7984 Long term (current) use of oral hypoglycemic drugs: Secondary | ICD-10-CM | POA: Insufficient documentation

## 2019-03-20 DIAGNOSIS — J449 Chronic obstructive pulmonary disease, unspecified: Secondary | ICD-10-CM | POA: Diagnosis not present

## 2019-03-20 DIAGNOSIS — Z882 Allergy status to sulfonamides status: Secondary | ICD-10-CM | POA: Insufficient documentation

## 2019-03-20 DIAGNOSIS — I5032 Chronic diastolic (congestive) heart failure: Secondary | ICD-10-CM

## 2019-03-20 DIAGNOSIS — Z8249 Family history of ischemic heart disease and other diseases of the circulatory system: Secondary | ICD-10-CM | POA: Diagnosis not present

## 2019-03-20 DIAGNOSIS — E119 Type 2 diabetes mellitus without complications: Secondary | ICD-10-CM | POA: Insufficient documentation

## 2019-03-20 DIAGNOSIS — I110 Hypertensive heart disease with heart failure: Secondary | ICD-10-CM | POA: Diagnosis not present

## 2019-03-20 DIAGNOSIS — Z7951 Long term (current) use of inhaled steroids: Secondary | ICD-10-CM | POA: Diagnosis not present

## 2019-03-20 DIAGNOSIS — I10 Essential (primary) hypertension: Secondary | ICD-10-CM

## 2019-03-20 NOTE — Patient Instructions (Signed)
Continue weighing daily and call for an overnight weight gain of > 2 pounds or a weekly weight gain of >5 pounds. 

## 2019-04-07 ENCOUNTER — Telehealth (HOSPITAL_COMMUNITY): Payer: Self-pay

## 2019-04-08 NOTE — Telephone Encounter (Signed)
Today was a telephone visit with [name].  He states doing well, he has several appts coming up for eye doctors and dentist.  He states has all his medications, verified them.  He states weight is same at 243 lbs.  He denies swelling in extremities.  He denies chest pain, shortness of breath, headaches or dizziness.  He stays active walking to where he needs to go.  He states has food and tries to watch high sodium foods but hard to on a budget.  Watched his fluid intake.  Will continue to visit for heart failure.   [place] [phone]

## 2019-05-19 ENCOUNTER — Telehealth (HOSPITAL_COMMUNITY): Payer: Self-pay

## 2019-05-19 NOTE — Telephone Encounter (Signed)
Today had a telephone visit with Lux.  He states doing well.  He has all his medications and aware of how to take them.  He has been doing good lately, no flair up and has a stable place to live now.  He has the resources that he needs for daily living.  Explained to him I will be discharging him from the program since he has been doing well.  Explained to him to keep my number and he has any problems feel free to call me, I can always add him back.  He appears to understand.  Darylene Price at [facility] clinic is aware and agree with discharge.    [place] [phone]

## 2019-07-15 NOTE — Progress Notes (Deleted)
Patient ID: [name], male    DOB: [birth year], 68 y.o.   MRN: [id number]  HPI  [name] is a 68 y/o male with a history of DM, HTN, COPD, migraines, obstructive sleep apnea and chronic heart failure.  Echo report from 03/20/18 reviewed and showed an EF of 60-65% along with mild/moderate Drew and a mildly elevated PA pressure of 42 mm Hg.   He has not been admitted or been to the ED in the last 6 months.    He presents today for a follow-up visit with a chief complaint of   Past Medical History:  Diagnosis Date  . Asthma   . CHF (congestive heart failure) ([place])   . COPD (chronic obstructive pulmonary disease) ([place])   . Diabetes mellitus without complication ([place])   . Homelessness   . Hypertension   . Migraine   . Obesity   . Sleep apnea    Past Surgical History:  Procedure Laterality Date  . CARDIAC CATHETERIZATION    . CHOLECYSTECTOMY    . EYE SURGERY    . INNER EAR SURGERY    . NOSE SURGERY     Family History  Problem Relation Age of Onset  . Hypertension Mother   . Heart disease Mother   . Heart failure Maternal Grandmother    Social History   Tobacco Use  . Smoking status: Never Smoker  . Smokeless tobacco: Never Used  Substance Use Topics  . Alcohol use: Never    Frequency: Never   Allergies  Allergen Reactions  . Eggs Or Egg-Derived Products Nausea And Vomiting  . Novocain [Procaine]   . Sulfa Antibiotics       Review of Systems  Constitutional: Positive for fatigue. Negative for appetite change.  HENT: Negative for congestion, postnasal drip and sore throat.   Eyes: Negative.   Respiratory: Positive for cough (dry cough at time) and shortness of breath.   Cardiovascular: Positive for chest pain (infrequent), palpitations (on occasion) and leg swelling (left foot).  Gastrointestinal: Negative for abdominal distention and abdominal pain.  Endocrine: Negative.   Genitourinary: Negative.   Musculoskeletal: Positive for arthralgias (right leg  pain), back pain and neck pain.  Skin: Negative.   Allergic/Immunologic: Negative.   Neurological: Positive for light-headedness (sometimes) and headaches (migraines couple days of week). Negative for dizziness.  Hematological: Negative for adenopathy. Does not bruise/bleed easily.  Psychiatric/Behavioral: Positive for sleep disturbance (not sleeping well). Negative for dysphoric mood. The patient is not nervous/anxious.      Physical Exam Vitals signs and nursing note reviewed.  Constitutional:      Appearance: He is well-developed.  HENT:     Head: Normocephalic and atraumatic.  Neck:     Musculoskeletal: Normal range of motion and neck supple.     Vascular: No JVD.  Cardiovascular:     Rate and Rhythm: Regular rhythm. Bradycardia present.  Pulmonary:     Effort: Pulmonary effort is normal. No respiratory distress.     Breath sounds: No wheezing or rales.  Abdominal:     General: There is no distension.     Palpations: Abdomen is soft.  Musculoskeletal:        General: No tenderness.     Right lower leg: He exhibits no tenderness. Edema (trace pitting) present.     Left lower leg: He exhibits no tenderness. Edema (trace pitting) present.  Skin:    General: Skin is warm and dry.  Neurological:     Mental Status:  He is alert and oriented to person, place, and time.  Psychiatric:        Behavior: Behavior normal.    Assessment & Plan:  1: Chronic heart failure with preserved ejection fraction- - NYHA class II - euvolemic today - weighing daily; reminded to call for an overnight weight gain of >2 pounds or a weekly weight gain of >5 pounds - weight 252.2 pounds from last visit here 4 months ago - admits to not being very active and eating more over these last few months (COVID); encouraged him to increase his activity at home by walking in place on commercials - he has noticed that as his weight has risen, his shortness of breath has worsened - not adding salt to his food   - discussed the importance of closely following a 2000mg  sodium diet as much as he can - hasn't been walking as much lately as he now has transportation that picks him up at his door so he doesn't have to walk to the bus stop - had telemedicine visit with cardiology [name]) 12/02/2018 - BNP 06/23/18 was 62.0   2: HTN- - BP  - saw PCP at [facility] 04/19/18  - BMP 08/01/18 reviewed and showed sodium 141, potassium 3.8, creatinine 0.95 and GFR >60  3: DM- - A1c 03/20/18 was 5.5% - glucose at home today was    Patient did not bring his medications nor a list. Each medication was verbally reviewed with the patient and he was encouraged to bring the bottles to every visit to confirm accuracy of list.

## 2019-07-16 ENCOUNTER — Ambulatory Visit: Payer: Medicare Other | Admitting: Family

## 2019-07-16 ENCOUNTER — Telehealth: Payer: Self-pay | Admitting: Family

## 2019-07-16 NOTE — Telephone Encounter (Signed)
Patient did not show for his [facility] Failure [facility] appointment on 07/16/2019. Will attempt to reschedule.

## 2019-12-30 ENCOUNTER — Ambulatory Visit: Payer: Medicare HMO | Attending: Internal Medicine

## 2019-12-30 DIAGNOSIS — Z23 Encounter for immunization: Secondary | ICD-10-CM

## 2019-12-30 NOTE — Progress Notes (Signed)
   Covid-19 Vaccination Clinic  Name:  [name]    MRN: [id number] DOB: 1951/01/24  12/30/2019  [name] was observed post Covid-19 immunization for 15 minutes without incident. He was provided with Vaccine Information Sheet and instruction to access the V-Safe system.   [name] was instructed to call 911 with any severe reactions post vaccine: Marland Kitchen Difficulty breathing  . Swelling of face and throat  . A fast heartbeat  . A bad rash all over body  . Dizziness and weakness   Immunizations Administered    Name Date Dose VIS Date Route   Pfizer COVID-19 Vaccine 12/30/2019  8:56 AM 0.3 mL 10/21/2018 Intramuscular   Manufacturer: [place]   Lot: [id number]   [place]: [id number]

## 2020-02-17 ENCOUNTER — Other Ambulatory Visit: Payer: Self-pay

## 2020-02-17 ENCOUNTER — Encounter: Payer: Self-pay | Admitting: Emergency Medicine

## 2020-02-17 ENCOUNTER — Emergency Department: Payer: Medicare HMO

## 2020-02-17 ENCOUNTER — Emergency Department
Admission: EM | Admit: 2020-02-17 | Discharge: 2020-02-17 | Disposition: A | Discharge status: Home / Self Care | Payer: Medicare HMO | Attending: Emergency Medicine | Admitting: Emergency Medicine

## 2020-02-17 DIAGNOSIS — Z7984 Long term (current) use of oral hypoglycemic drugs: Secondary | ICD-10-CM | POA: Diagnosis not present

## 2020-02-17 DIAGNOSIS — N179 Acute kidney failure, unspecified: Secondary | ICD-10-CM | POA: Diagnosis not present

## 2020-02-17 DIAGNOSIS — J449 Chronic obstructive pulmonary disease, unspecified: Secondary | ICD-10-CM | POA: Insufficient documentation

## 2020-02-17 DIAGNOSIS — Z7982 Long term (current) use of aspirin: Secondary | ICD-10-CM | POA: Insufficient documentation

## 2020-02-17 DIAGNOSIS — Z79899 Other long term (current) drug therapy: Secondary | ICD-10-CM | POA: Diagnosis not present

## 2020-02-17 DIAGNOSIS — Z882 Allergy status to sulfonamides status: Secondary | ICD-10-CM | POA: Diagnosis not present

## 2020-02-17 DIAGNOSIS — Z884 Allergy status to anesthetic agent status: Secondary | ICD-10-CM | POA: Diagnosis not present

## 2020-02-17 DIAGNOSIS — I110 Hypertensive heart disease with heart failure: Secondary | ICD-10-CM | POA: Diagnosis not present

## 2020-02-17 DIAGNOSIS — E119 Type 2 diabetes mellitus without complications: Secondary | ICD-10-CM | POA: Diagnosis not present

## 2020-02-17 DIAGNOSIS — R531 Weakness: Secondary | ICD-10-CM | POA: Diagnosis present

## 2020-02-17 DIAGNOSIS — I509 Heart failure, unspecified: Secondary | ICD-10-CM | POA: Insufficient documentation

## 2020-02-17 DIAGNOSIS — E860 Dehydration: Secondary | ICD-10-CM | POA: Diagnosis not present

## 2020-02-17 LAB — COMPREHENSIVE METABOLIC PANEL
ALT: 27 U/L (ref 0–44)
AST: 49 U/L — ABNORMAL HIGH (ref 15–41)
Albumin: 2.7 g/dL — ABNORMAL LOW (ref 3.5–5.0)
Alkaline Phosphatase: 83 U/L (ref 38–126)
Anion gap: 11 (ref 5–15)
BUN: 18 mg/dL (ref 8–23)
CO2: 23 mmol/L (ref 22–32)
Calcium: 8.5 mg/dL — ABNORMAL LOW (ref 8.9–10.3)
Chloride: 107 mmol/L (ref 98–111)
Creatinine, Ser: 1.6 mg/dL — ABNORMAL HIGH (ref 0.61–1.24)
GFR calc Af Amer: 50 mL/min — ABNORMAL LOW (ref >60)
GFR calc non Af Amer: 43 mL/min — ABNORMAL LOW (ref >60)
Glucose, Bld: 136 mg/dL — ABNORMAL HIGH (ref 70–99)
Potassium: 3.4 mmol/L — ABNORMAL LOW (ref 3.5–5.1)
Sodium: 141 mmol/L (ref 135–145)
Total Bilirubin: 1.2 mg/dL (ref 0.3–1.2)
Total Protein: 6.4 g/dL — ABNORMAL LOW (ref 6.5–8.1)

## 2020-02-17 LAB — CBC WITH DIFFERENTIAL/PLATELET
Abs Immature Granulocytes: 0.01 10*3/uL (ref 0.00–0.07)
Basophils Absolute: 0 10*3/uL (ref 0.0–0.1)
Basophils Relative: 1 %
Eosinophils Absolute: 0.1 10*3/uL (ref 0.0–0.5)
Eosinophils Relative: 2 %
HCT: 33.9 % — ABNORMAL LOW (ref 39.0–52.0)
Hemoglobin: 11.2 g/dL — ABNORMAL LOW (ref 13.0–17.0)
Immature Granulocytes: 0 %
Lymphocytes Relative: 25 %
Lymphs Abs: 0.8 10*3/uL (ref 0.7–4.0)
MCH: 29.1 pg (ref 26.0–34.0)
MCHC: 33 g/dL (ref 30.0–36.0)
MCV: 88.1 fL (ref 80.0–100.0)
Monocytes Absolute: 0.4 10*3/uL (ref 0.1–1.0)
Monocytes Relative: 13 %
Neutro Abs: 1.9 10*3/uL (ref 1.7–7.7)
Neutrophils Relative %: 59 %
Platelets: 104 10*3/uL — ABNORMAL LOW (ref 150–400)
RBC: 3.85 MIL/uL — ABNORMAL LOW (ref 4.22–5.81)
RDW: 14.7 % (ref 11.5–15.5)
WBC: 3.2 10*3/uL — ABNORMAL LOW (ref 4.0–10.5)
nRBC: 0 % (ref 0.0–0.2)

## 2020-02-17 LAB — TROPONIN I (HIGH SENSITIVITY)
Troponin I (High Sensitivity): 22 ng/L — ABNORMAL HIGH (ref <18)
Troponin I (High Sensitivity): 20 ng/L — ABNORMAL HIGH (ref <18)

## 2020-02-17 LAB — MAGNESIUM: Magnesium: 1.4 mg/dL — ABNORMAL LOW (ref 1.7–2.4)

## 2020-02-17 LAB — BRAIN NATRIURETIC PEPTIDE: B Natriuretic Peptide: 118.6 pg/mL — ABNORMAL HIGH (ref 0.0–100.0)

## 2020-02-17 MED ORDER — ONDANSETRON HCL 4 MG/2ML IJ SOLN
4.0000 mg | Freq: Once | INTRAMUSCULAR | Status: AC
  Administered 2020-02-17: 4 mg via INTRAVENOUS
  Filled 2020-02-17: qty 2

## 2020-02-17 MED ORDER — POTASSIUM CHLORIDE CRYS ER 20 MEQ PO TBCR
40.0000 meq | EXTENDED_RELEASE_TABLET | Freq: Once | ORAL | Status: AC
  Administered 2020-02-17: 40 meq via ORAL
  Filled 2020-02-17: qty 2

## 2020-02-17 MED ORDER — IOHEXOL 350 MG/ML SOLN
75.0000 mL | Freq: Once | INTRAVENOUS | Status: AC | PRN
  Administered 2020-02-17: 75 mL via INTRAVENOUS

## 2020-02-17 MED ORDER — HYDROMORPHONE HCL 1 MG/ML IJ SOLN
0.5000 mg | Freq: Once | INTRAMUSCULAR | Status: AC
  Administered 2020-02-17: 0.5 mg via INTRAVENOUS
  Filled 2020-02-17: qty 1

## 2020-02-17 MED ORDER — MAGNESIUM SULFATE 2 GM/50ML IV SOLN
2.0000 g | Freq: Once | INTRAVENOUS | Status: AC
  Administered 2020-02-17: 2 g via INTRAVENOUS
  Filled 2020-02-17: qty 50

## 2020-02-17 NOTE — ED Notes (Signed)
Pt transported to CT at this time.

## 2020-02-17 NOTE — ED Notes (Signed)
NAD noted at time of D/C. Pt take to lobby via wheelchair by this RN. EDP aware of BP at time of D/C, states okay for discharge. Pt given cab voucher for discharge. Pt denies comments/concerns regarding D/C instructions. Verbal consent for D/C obtained by this RN.

## 2020-02-17 NOTE — ED Triage Notes (Signed)
Pt presents to ED via [facility], per EMS pt was walking around [place] when he felt like his legs were starting to give out and he was going to pass out so he sat down on a bench. Pt reports sudden onset migraine with weakness symptoms. Pt states sat on bench approx 30 minutes prior to calling EMS.   Per EMS pt with initial BP 76/30, received approx 600cc's NS en route with BP 82/36 just PTA. Pt with 20G to R AC. Per EMS pt takes 80mg  Lasix daily. Pt presents A&O x4. Pt states "feels like my head is going to split", states pain radiates down to his back.

## 2020-02-17 NOTE — ED Notes (Signed)
EDP at bedside at this time to review results with patient.

## 2020-02-17 NOTE — Discharge Instructions (Addendum)
I suspect that you might be getting a little over diuresed due to your low blood pressures and your slightly elevated kidney function.  We have given you some fluids and your symptoms seem to gotten better.  I recommend that you take you hold your Lasix to dose tonight and reevaluate blood pressures tomorrow.  If they are elevated you can continue taking your dose tomorrow.  However you need to follow-up with your cardiology team this week for repeat kidney function.  If you develop worsening symptoms you can return to the ER.

## 2020-02-17 NOTE — ED Notes (Signed)
This RN to bedside, repeat troponin collected by this RN. Pt tolerated well. Pt states pain 5/10. Pt denies further needs at this time. VSS.

## 2020-02-17 NOTE — ED Notes (Signed)
Pt visualized in NAD at this time, resting in bed at this time eating ice chips per his request.

## 2020-02-17 NOTE — ED Notes (Signed)
Pt ambulatory up and down hall with this RN with noted stable gait at this time. Pt c/o bilateral posterior thigh pain at this time. Pt given ice chips per his request. VSS after ambulating.

## 2020-02-17 NOTE — ED Provider Notes (Signed)
[facility] Emergency Department Provider Note  ____________________________________________   First MD Initiated Contact with Patient 02/17/20 1202     (approximate)  I have reviewed the triage vital signs and the nursing notes.   HISTORY  Chief Complaint Weakness and Hypotension    HPI [name] is a 69 y.o. male with CHF, COPD, diabetes who comes in for weakness.  Patient states that he was walking around outside when he felt like his legs were going to give out and his vision went really bright and he developed a migraine that radiated into his back..  He sat down on the bench.  Did not have LOC.  He does report having a history of migraines in the past and they have been this severe in the past but it seemed new for the pain to be in his upper back as well.  He said the bed for 30 minutes prior to calling EMS.  When EMS got there his blood pressure was 76/30 patient received 600 cc of fluid and blood pressure was 82/36.  Patient does take 80 mg of Lasix daily.  He reports some shortness of breath but that sounds at baseline.  No changes to that.  He does report a lot of chronic pain with pain in all his joints as well.   He is not on any oxygen.          Past Medical History:  Diagnosis Date  . Asthma   . CHF (congestive heart failure) ([place])   . COPD (chronic obstructive pulmonary disease) ([place])   . Diabetes mellitus without complication ([place])   . Homelessness   . Hypertension   . Migraine   . Obesity   . Sleep apnea     Patient Active Problem List   Diagnosis Date Noted  . HTN (hypertension) 04/17/2018  . DM (diabetes mellitus) ([place]) 04/17/2018  . Lymphedema 04/17/2018  . Chronic diastolic CHF (congestive heart failure) ([place]) 04/07/2018  . Anemia 04/07/2018  . Bilateral leg pain 03/29/2018  . Chest pain 03/20/2018  . Adjustment disorder with mixed anxiety and depressed mood 12/16/2017  . Homelessness 12/16/2017    Past Surgical  History:  Procedure Laterality Date  . CARDIAC CATHETERIZATION    . CHOLECYSTECTOMY    . EYE SURGERY    . INNER EAR SURGERY    . NOSE SURGERY      Prior to Admission medications   Medication Sig Start Date End Date Taking? Authorizing Provider  acetaminophen (TYLENOL) 650 MG CR tablet Take 1 tablet (650 mg total) by mouth 2 (two) times daily as needed for pain. 03/31/18   Gouru, Illene Silver, MD  albuterol (PROVENTIL HFA;VENTOLIN HFA) 108 (90 Base) MCG/ACT inhaler Inhale 2 puffs into the lungs every 6 (six) hours as needed for wheezing or shortness of breath. 07/09/18   Rudene Re, MD  aspirin EC 81 MG tablet Take 81 mg by mouth daily.    [provider]  beclomethasone (BECONASE-AQ) 42 MCG/SPRAY nasal spray Place 1 spray into both nostrils 2 (two) times daily. Dose is for each nostril.    [provider]  cetirizine (ZYRTEC) 10 MG tablet 10 mg daily.  07/21/18   [provider]  Dextromethorphan-guaiFENesin (WAL-TUSSIN COUGH/CHEST DM MAX) 10-200 MG/5ML LIQD Take 10 mLs by mouth every 4 (four) hours as needed.    [provider]  Dextrose, Diabetic Use, (DEXTROSE PO) Take by mouth.    [provider]  EPINEPHrine 0.3 mg/0.3 mL IJ  SOAJ injection Inject 0.3 mLs (0.3 mg total) into the muscle as needed. 12/02/18   Minna Merritts, MD  furosemide (LASIX) 40 MG tablet Take 2 tablets (80 mg total) by mouth daily. 04/16/18   Alisa Graff, FNP  lisinopril (PRINIVIL,ZESTRIL) 10 MG tablet Take 1 tablet (10 mg total) by mouth daily. 04/16/18   Alisa Graff, FNP  loperamide (LOPERAMIDE A-D) 2 MG tablet Take 4 mg by mouth as needed.     [provider]  meclizine (ANTIVERT) 25 MG tablet Take 25 mg by mouth 2 (two) times daily.     [provider]  metFORMIN (GLUCOPHAGE-XR) 500 MG 24 hr tablet Take 2 tablets (1,000 mg total) by mouth 2 (two) times daily. 03/31/18   Nicholes Mango, MD  Multiple Vitamin (MULTIVITAMIN WITH MINERALS) TABS tablet  Take 1 tablet by mouth daily.    [provider]  omeprazole (PRILOSEC) 40 MG capsule Take 1 capsule (40 mg total) by mouth AC breakfast. 03/31/18   Gouru, Illene Silver, MD  ondansetron (ZOFRAN ODT) 4 MG disintegrating tablet Take 1 tablet (4 mg total) by mouth every 8 (eight) hours as needed for nausea or vomiting. 07/29/18   Paulette Blanch, MD  PARoxetine (PAXIL) 10 MG tablet TAKE 1 TABLET BY MOUTH ONCE DAILY FOR MOOD 07/21/18   [provider]  potassium chloride (K-DUR) 10 MEQ tablet Take 1 tablet by mouth daily. 07/29/18   [provider]  promethazine (PHENERGAN) 25 MG tablet Take 25 mg by mouth every 6 (six) hours as needed for nausea or vomiting (before transportation).     [provider]  simvastatin (ZOCOR) 40 MG tablet Take 1 tablet (40 mg total) by mouth daily at 6 PM. 04/16/18   Alisa Graff, FNP  tamsulosin (FLOMAX) 0.4 MG CAPS capsule Take 0.4 mg by mouth daily.    [provider]    Allergies Eggs or egg-derived products, Novocain [procaine], and Sulfa antibiotics  Family History  Problem Relation Age of Onset  . Hypertension Mother   . Heart disease Mother   . Heart failure Maternal Grandmother     Social History Social History   Tobacco Use  . Smoking status: Former Research scientist (life sciences)  . Smokeless tobacco: Never Used  Vaping Use  . Vaping Use: Never assessed  Substance Use Topics  . Alcohol use: Never  . Drug use: Never      Review of Systems Constitutional: No fever/chills, near syncope Eyes: No visual changes. ENT: No sore throat. Cardiovascular: Denies chest pain. Respiratory: Denies shortness of breath. Gastrointestinal: No abdominal pain.  No nausea, no vomiting.  No diarrhea.  No constipation. Genitourinary: Negative for dysuria. Musculoskeletal: Positive back pain Skin: Negative for rash. Neurological: Positive headache, focal weakness or numbness. All other ROS  negative ____________________________________________   PHYSICAL EXAM:  VITAL SIGNS: ED Triage Vitals [02/17/20 1157]  Enc Vitals Group     BP (!) 108/56     Pulse Rate (!) 57     Resp 14     Temp 98.2 F (36.8 C)     Temp Source Oral     SpO2 98 %     Weight 155 lb (70.3 kg)     Height 5\' 6"  (1.676 m)     Head Circumference      Peak Flow      Pain Score 10     Pain Loc      Pain Edu?      Excl. in [place]?  Constitutional: Alert and oriented. Well appearing and in no acute distress. Eyes: Conjunctivae are normal. EOMI. Head: Atraumatic. Nose: No congestion/rhinnorhea. Mouth/Throat: Mucous membranes are moist.   Neck: No stridor. Trachea Midline. FROM Cardiovascular: Normal rate, regular rhythm. Grossly normal heart sounds.  Good peripheral circulation. Respiratory: Normal respiratory effort.  No retractions. Lungs CTAB. Gastrointestinal: Soft and nontender. No distention. No abdominal bruits.  Musculoskeletal: 1+ edema bilaterally no joint effusions. Neurologic:  Normal speech and language. No gross focal neurologic deficits are appreciated.  Skin:  Skin is warm, dry and intact. No rash noted. Psychiatric: Mood and affect are normal. Speech and behavior are normal. GU: Deferred  Back: Patient reporting tenderness in the middle of his back ____________________________________________   LABS (all labs ordered are listed, but only abnormal results are displayed)  Labs Reviewed  CBC WITH DIFFERENTIAL/PLATELET - Abnormal; Notable for the following components:      Result Value   WBC 3.2 (*)    RBC 3.85 (*)    Hemoglobin 11.2 (*)    HCT 33.9 (*)    Platelets 104 (*)    All other components within normal limits  COMPREHENSIVE METABOLIC PANEL - Abnormal; Notable for the following components:   Potassium 3.4 (*)    Glucose, Bld 136 (*)    Creatinine, Ser 1.60 (*)    Calcium 8.5 (*)    Total Protein 6.4 (*)    Albumin 2.7 (*)    AST 49 (*)    GFR calc non Af Amer  43 (*)    GFR calc Af Amer 50 (*)    All other components within normal limits  BRAIN NATRIURETIC PEPTIDE - Abnormal; Notable for the following components:   B Natriuretic Peptide 118.6 (*)    All other components within normal limits  MAGNESIUM - Abnormal; Notable for the following components:   Magnesium 1.4 (*)    All other components within normal limits  TROPONIN I (HIGH SENSITIVITY) - Abnormal; Notable for the following components:   Troponin I (High Sensitivity) 22 (*)    All other components within normal limits  TROPONIN I (HIGH SENSITIVITY) - Abnormal; Notable for the following components:   Troponin I (High Sensitivity) 20 (*)    All other components within normal limits   ____________________________________________   ED ECG REPORT I, Vanessa Deshler, the attending physician, personally viewed and interpreted this ECG.  Sinus bradycardia rate of 56, no ST elevation, no T wave inversions, normal intervals ____________________________________________  RADIOLOGY   Official radiology report(s): No results found. IMPRESSION: 1. No acute findings. 2. Right nephrolithiasis without hydronephrosis. 3. Nodular hepatic contour suggesting cirrhosis. 4. Dilated central pulmonary arteries suggesting pulmonary arterial hypertension. 5. Coronary and Aortic Atherosclerosis (ICD10-I70.0).  IMPRESSION: No acute intracranial findings. ____________________________________________   PROCEDURES  Procedure(s) performed (including Critical Care):  Procedures   ____________________________________________   INITIAL IMPRESSION / ASSESSMENT AND PLAN / ED COURSE  [name] was evaluated in Emergency Department on 02/17/2020 for the symptoms described in the history of present illness. He was evaluated in the context of the global COVID-19 pandemic, which necessitated consideration that the patient might be at risk for infection with the SARS-CoV-2 virus that causes COVID-19.  Institutional protocols and algorithms that pertain to the evaluation of patients at risk for COVID-19 are in a state of rapid change based on information released by regulatory bodies including the CDC and federal and state organizations. These policies and algorithms were followed during the patient's care in the ED.  Patient is a 69 year old who comes in with weakness, headache, back pain and low blood pressures.  Patient states that he has severe migraines in the past and this migraine seem to be similar to his prior but given the near syncopal component with it they wish to get a CT head just make sure no evidence of intracranial hemorrhage.  Given within 6 hours of symptom onset this would be very good at detecting intracranial hemorrhage.  I have low enough suspicion that if it was negative I think it could rule out intracranial hemorrhage.    Given the back pain is difficult to tell what is new versus chronic pain but I think would be best to do a CT dissection to make sure no evidence of AAA rupture or dissection.  Patient is also on a significant of Lasix and this episode could be secondary to some dehydration.  Patient already received 600 cc of fluid do not want to fluid overload him and so we will get orthostatics to see if he needs additional fluid.  We will give patient some IV pain medication.  Seems less likely to be related to cardiac arrhythmia but will get EKG and cardiac markers as well.  CT imaging was negative  Cardiac markers went from 22-20 which are stable.  They are slightly elevated but I suspect it is from his AKI  His creatinine was 1.6 and his CT scan did not show evidence of pulmonary edema he does have some trace edema in his legs but he states that that is much improved from where he normally is.  His BNP is also 3 times less than his baseline.  His magnesium was slightly low at given 2 g of mag.  3:26 PM evaluated patient.  Blood pressure was 150s over 90s.   Patient is ambulating well denies any recurrent symptoms.  Patient had an echocardiogram 1 year ago that showed no reduced EF and at that time he had no diastolic dysfunction as well.  Patient's states that his Lasix is supposed to be 80 mg daily.  His repeat orthostatics are negative.  At this time I suspect that he is most likely being over diuresed and that is what led him to have some lower blood pressures.  We discussed the different options but patient states that he is feeling better is able to ambulate without any issues has not had any recurrent symptoms.  His headache pain is better controlled.  At this time he feels comfortable going home and will hold his Lasix dose tonight and follow-up with his doctor this week for recheck of his kidney function and further discussion of his Lasix dosing.  He understands that if at home he starts to have low blood pressures he could hold an additional dose of Lasix if needed.  He states he does have a blood pressure cuff at home to be able to keep track of this  I discussed the provisional nature of ED diagnosis, the treatment so far, the ongoing plan of care, follow up appointments and return precautions with the patient and any family or support people present. They expressed understanding and agreed with the plan, discharged home.         ____________________________________________   FINAL CLINICAL IMPRESSION(S) / ED DIAGNOSES   Final diagnoses:  Dehydration  AKI (acute kidney injury) ([place])      MEDICATIONS GIVEN DURING THIS VISIT:  Medications  HYDROmorphone (DILAUDID) injection 0.5 mg (0.5 mg Intravenous Given 02/17/20 1235)  ondansetron (  ZOFRAN) injection 4 mg (4 mg Intravenous Given 02/17/20 1235)  iohexol (OMNIPAQUE) 350 MG/ML injection 75 mL (75 mLs Intravenous Contrast Given 02/17/20 1304)  magnesium sulfate IVPB 2 g 50 mL (0 g Intravenous Stopped 02/17/20 1615)  potassium chloride SA (KLOR-CON) CR tablet 40 mEq (40 mEq Oral Given  02/17/20 1515)     ED Discharge Orders         Ordered    AMB referral to CHF clinic     Discontinue  Reprint     02/17/20 1528           Note:  This document was prepared using Dragon voice recognition software and may include unintentional dictation errors.   Vanessa [place], MD 02/18/20 [phone]

## 2020-03-27 DIAGNOSIS — U071 COVID-19: Secondary | ICD-10-CM

## 2020-03-27 HISTORY — DX: COVID-19: U07.1

## 2020-04-16 ENCOUNTER — Emergency Department: Payer: Medicare HMO

## 2020-04-16 ENCOUNTER — Other Ambulatory Visit: Payer: Self-pay

## 2020-04-16 ENCOUNTER — Inpatient Hospital Stay
Admission: EM | Admit: 2020-04-16 | Discharge: 2020-04-19 | DRG: 177 | Disposition: A | Discharge status: Home / Self Care | Payer: Medicare HMO | Attending: Family Medicine | Admitting: Family Medicine

## 2020-04-16 DIAGNOSIS — E872 Acidosis, unspecified: Secondary | ICD-10-CM

## 2020-04-16 DIAGNOSIS — Z79899 Other long term (current) drug therapy: Secondary | ICD-10-CM

## 2020-04-16 DIAGNOSIS — Z6841 Body Mass Index (BMI) 40.0 and over, adult: Secondary | ICD-10-CM

## 2020-04-16 DIAGNOSIS — J1282 Pneumonia due to coronavirus disease 2019: Secondary | ICD-10-CM | POA: Diagnosis present

## 2020-04-16 DIAGNOSIS — Z590 Homelessness: Secondary | ICD-10-CM

## 2020-04-16 DIAGNOSIS — I5032 Chronic diastolic (congestive) heart failure: Secondary | ICD-10-CM | POA: Diagnosis present

## 2020-04-16 DIAGNOSIS — K7460 Unspecified cirrhosis of liver: Secondary | ICD-10-CM | POA: Diagnosis present

## 2020-04-16 DIAGNOSIS — Z8249 Family history of ischemic heart disease and other diseases of the circulatory system: Secondary | ICD-10-CM

## 2020-04-16 DIAGNOSIS — Z888 Allergy status to other drugs, medicaments and biological substances status: Secondary | ICD-10-CM

## 2020-04-16 DIAGNOSIS — Z8616 Personal history of COVID-19: Secondary | ICD-10-CM

## 2020-04-16 DIAGNOSIS — E1165 Type 2 diabetes mellitus with hyperglycemia: Secondary | ICD-10-CM

## 2020-04-16 DIAGNOSIS — Z7982 Long term (current) use of aspirin: Secondary | ICD-10-CM

## 2020-04-16 DIAGNOSIS — A419 Sepsis, unspecified organism: Secondary | ICD-10-CM

## 2020-04-16 DIAGNOSIS — E66813 Obesity, class 3: Secondary | ICD-10-CM

## 2020-04-16 DIAGNOSIS — U071 COVID-19: Principal | ICD-10-CM | POA: Diagnosis present

## 2020-04-16 DIAGNOSIS — D72819 Decreased white blood cell count, unspecified: Secondary | ICD-10-CM

## 2020-04-16 DIAGNOSIS — J440 Chronic obstructive pulmonary disease with acute lower respiratory infection: Secondary | ICD-10-CM | POA: Diagnosis present

## 2020-04-16 DIAGNOSIS — Z87891 Personal history of nicotine dependence: Secondary | ICD-10-CM

## 2020-04-16 DIAGNOSIS — B1920 Unspecified viral hepatitis C without hepatic coma: Secondary | ICD-10-CM | POA: Diagnosis present

## 2020-04-16 DIAGNOSIS — D696 Thrombocytopenia, unspecified: Secondary | ICD-10-CM | POA: Diagnosis present

## 2020-04-16 DIAGNOSIS — Z91012 Allergy to eggs: Secondary | ICD-10-CM

## 2020-04-16 DIAGNOSIS — R7401 Elevation of levels of liver transaminase levels: Secondary | ICD-10-CM

## 2020-04-16 DIAGNOSIS — J189 Pneumonia, unspecified organism: Secondary | ICD-10-CM | POA: Diagnosis not present

## 2020-04-16 DIAGNOSIS — Z9049 Acquired absence of other specified parts of digestive tract: Secondary | ICD-10-CM

## 2020-04-16 DIAGNOSIS — E876 Hypokalemia: Secondary | ICD-10-CM | POA: Diagnosis present

## 2020-04-16 DIAGNOSIS — Z882 Allergy status to sulfonamides status: Secondary | ICD-10-CM

## 2020-04-16 DIAGNOSIS — E119 Type 2 diabetes mellitus without complications: Secondary | ICD-10-CM | POA: Diagnosis present

## 2020-04-16 DIAGNOSIS — Z7984 Long term (current) use of oral hypoglycemic drugs: Secondary | ICD-10-CM

## 2020-04-16 DIAGNOSIS — I110 Hypertensive heart disease with heart failure: Secondary | ICD-10-CM | POA: Diagnosis present

## 2020-04-16 DIAGNOSIS — G4730 Sleep apnea, unspecified: Secondary | ICD-10-CM | POA: Diagnosis present

## 2020-04-16 DIAGNOSIS — A4189 Other specified sepsis: Secondary | ICD-10-CM | POA: Diagnosis present

## 2020-04-16 HISTORY — DX: Personal history of COVID-19: Z86.16

## 2020-04-16 LAB — COMPREHENSIVE METABOLIC PANEL
ALT: 34 U/L (ref 0–44)
AST: 84 U/L — ABNORMAL HIGH (ref 15–41)
Albumin: 3 g/dL — ABNORMAL LOW (ref 3.5–5.0)
Alkaline Phosphatase: 66 U/L (ref 38–126)
Anion gap: 11 (ref 5–15)
BUN: 13 mg/dL (ref 8–23)
CO2: 25 mmol/L (ref 22–32)
Calcium: 9.1 mg/dL (ref 8.9–10.3)
Chloride: 100 mmol/L (ref 98–111)
Creatinine, Ser: 1.08 mg/dL (ref 0.61–1.24)
GFR calc Af Amer: >60 mL/min (ref >60)
GFR calc non Af Amer: >60 mL/min (ref >60)
Glucose, Bld: 102 mg/dL — ABNORMAL HIGH (ref 70–99)
Potassium: 3.5 mmol/L (ref 3.5–5.1)
Sodium: 136 mmol/L (ref 135–145)
Total Bilirubin: 1.4 mg/dL — ABNORMAL HIGH (ref 0.3–1.2)
Total Protein: 7.4 g/dL (ref 6.5–8.1)

## 2020-04-16 LAB — CBC WITH DIFFERENTIAL/PLATELET
Abs Immature Granulocytes: 0.01 10*3/uL (ref 0.00–0.07)
Basophils Absolute: 0 10*3/uL (ref 0.0–0.1)
Basophils Relative: 1 %
Eosinophils Absolute: 0 10*3/uL (ref 0.0–0.5)
Eosinophils Relative: 1 %
HCT: 37.4 % — ABNORMAL LOW (ref 39.0–52.0)
Hemoglobin: 12.8 g/dL — ABNORMAL LOW (ref 13.0–17.0)
Immature Granulocytes: 0 %
Lymphocytes Relative: 19 %
Lymphs Abs: 0.6 10*3/uL — ABNORMAL LOW (ref 0.7–4.0)
MCH: 29.2 pg (ref 26.0–34.0)
MCHC: 34.2 g/dL (ref 30.0–36.0)
MCV: 85.2 fL (ref 80.0–100.0)
Monocytes Absolute: 0.7 10*3/uL (ref 0.1–1.0)
Monocytes Relative: 21 %
Neutro Abs: 1.9 10*3/uL (ref 1.7–7.7)
Neutrophils Relative %: 58 %
Platelets: 86 10*3/uL — ABNORMAL LOW (ref 150–400)
RBC: 4.39 MIL/uL (ref 4.22–5.81)
RDW: 15 % (ref 11.5–15.5)
WBC: 3.3 10*3/uL — ABNORMAL LOW (ref 4.0–10.5)
nRBC: 0 % (ref 0.0–0.2)

## 2020-04-16 LAB — TROPONIN I (HIGH SENSITIVITY)
Troponin I (High Sensitivity): 27 ng/L — ABNORMAL HIGH (ref <18)
Troponin I (High Sensitivity): 28 ng/L — ABNORMAL HIGH (ref <18)

## 2020-04-16 LAB — PROTIME-INR
INR: 1 (ref 0.8–1.2)
Prothrombin Time: 12.4 seconds (ref 11.4–15.2)

## 2020-04-16 LAB — LACTIC ACID, PLASMA
Lactic Acid, Venous: 2.1 mmol/L (ref 0.5–1.9)
Lactic Acid, Venous: 2.5 mmol/L (ref 0.5–1.9)

## 2020-04-16 LAB — SARS CORONAVIRUS 2 BY RT PCR (HOSPITAL ORDER, PERFORMED IN ~~LOC~~ HOSPITAL LAB): SARS Coronavirus 2: POSITIVE — AB

## 2020-04-16 LAB — APTT: aPTT: 34 seconds (ref 24–36)

## 2020-04-16 LAB — PROCALCITONIN: Procalcitonin: <0.1 ng/mL

## 2020-04-16 LAB — BRAIN NATRIURETIC PEPTIDE: B Natriuretic Peptide: 136.1 pg/mL — ABNORMAL HIGH (ref 0.0–100.0)

## 2020-04-16 LAB — ABO/RH: ABO/RH(D): AB NEG

## 2020-04-16 MED ORDER — LISINOPRIL 10 MG PO TABS
10.0000 mg | ORAL_TABLET | Freq: Every day | ORAL | Status: DC
  Administered 2020-04-17 – 2020-04-19 (×3): 10 mg via ORAL
  Filled 2020-04-16 (×3): qty 1

## 2020-04-16 MED ORDER — TAMSULOSIN HCL 0.4 MG PO CAPS
0.4000 mg | ORAL_CAPSULE | Freq: Every day | ORAL | Status: DC
  Administered 2020-04-17 – 2020-04-19 (×3): 0.4 mg via ORAL
  Filled 2020-04-16 (×3): qty 1

## 2020-04-16 MED ORDER — ENOXAPARIN SODIUM 40 MG/0.4ML ~~LOC~~ SOLN
40.0000 mg | Freq: Two times a day (BID) | SUBCUTANEOUS | Status: DC
  Administered 2020-04-16 – 2020-04-19 (×6): 40 mg via SUBCUTANEOUS
  Filled 2020-04-16 (×5): qty 0.4

## 2020-04-16 MED ORDER — POLYETHYLENE GLYCOL 3350 17 G PO PACK: 17.0000 g | PACK | Freq: Two times a day (BID) | ORAL | Status: DC | PRN

## 2020-04-16 MED ORDER — ASPIRIN EC 81 MG PO TBEC
81.0000 mg | DELAYED_RELEASE_TABLET | Freq: Every day | ORAL | Status: DC
  Administered 2020-04-17 – 2020-04-19 (×3): 81 mg via ORAL
  Filled 2020-04-16 (×3): qty 1

## 2020-04-16 MED ORDER — ALUM & MAG HYDROXIDE-SIMETH 200-200-20 MG/5ML PO SUSP: 15.0000 mL | Freq: Four times a day (QID) | ORAL | Status: DC | PRN

## 2020-04-16 MED ORDER — ACETAMINOPHEN 325 MG PO TABS
650.0000 mg | ORAL_TABLET | Freq: Once | ORAL | Status: AC
  Administered 2020-04-16: 650 mg via ORAL
  Filled 2020-04-16: qty 2

## 2020-04-16 MED ORDER — CALCIUM CARBONATE ANTACID 500 MG PO CHEW: 1.0000 | CHEWABLE_TABLET | Freq: Three times a day (TID) | ORAL | Status: DC | PRN

## 2020-04-16 MED ORDER — FUROSEMIDE 40 MG PO TABS
80.0000 mg | ORAL_TABLET | Freq: Every day | ORAL | Status: DC
  Administered 2020-04-17 – 2020-04-19 (×3): 80 mg via ORAL
  Filled 2020-04-16 (×3): qty 2

## 2020-04-16 MED ORDER — ACETAMINOPHEN 500 MG PO TABS
1000.0000 mg | ORAL_TABLET | Freq: Three times a day (TID) | ORAL | Status: DC | PRN
  Administered 2020-04-17 – 2020-04-19 (×4): 1000 mg via ORAL
  Filled 2020-04-16 (×4): qty 2

## 2020-04-16 MED ORDER — SODIUM CHLORIDE 0.9 % IV SOLN: 500.0000 mg | Freq: Once | INTRAVENOUS | Status: DC

## 2020-04-16 MED ORDER — PAROXETINE HCL 10 MG PO TABS
10.0000 mg | ORAL_TABLET | Freq: Every day | ORAL | Status: DC
  Administered 2020-04-17 – 2020-04-19 (×3): 10 mg via ORAL
  Filled 2020-04-16 (×4): qty 1

## 2020-04-16 MED ORDER — SIMVASTATIN 20 MG PO TABS
40.0000 mg | ORAL_TABLET | Freq: Every day | ORAL | Status: DC
  Administered 2020-04-17 – 2020-04-18 (×2): 40 mg via ORAL
  Filled 2020-04-16 (×2): qty 2

## 2020-04-16 MED ORDER — ALBUTEROL SULFATE HFA 108 (90 BASE) MCG/ACT IN AERS
2.0000 | INHALATION_SPRAY | RESPIRATORY_TRACT | Status: DC
  Administered 2020-04-16 – 2020-04-19 (×13): 2 via RESPIRATORY_TRACT
  Filled 2020-04-16 (×2): qty 6.7

## 2020-04-16 MED ORDER — ONDANSETRON HCL 4 MG/2ML IJ SOLN: 4.0000 mg | Freq: Four times a day (QID) | INTRAMUSCULAR | Status: DC | PRN

## 2020-04-16 MED ORDER — GUAIFENESIN-DM 100-10 MG/5ML PO SYRP
10.0000 mL | ORAL_SOLUTION | Freq: Four times a day (QID) | ORAL | Status: DC | PRN
  Administered 2020-04-16 – 2020-04-19 (×5): 10 mL via ORAL
  Filled 2020-04-16 (×6): qty 10

## 2020-04-16 MED ORDER — ONDANSETRON 4 MG PO TBDP
4.0000 mg | ORAL_TABLET | Freq: Three times a day (TID) | ORAL | Status: DC | PRN
  Filled 2020-04-16: qty 1

## 2020-04-16 MED ORDER — DOCUSATE SODIUM 100 MG PO CAPS: 100.0000 mg | ORAL_CAPSULE | Freq: Two times a day (BID) | ORAL | Status: DC | PRN

## 2020-04-16 MED ORDER — MECLIZINE HCL 25 MG PO TABS
25.0000 mg | ORAL_TABLET | Freq: Two times a day (BID) | ORAL | Status: DC | PRN
  Filled 2020-04-16: qty 1

## 2020-04-16 MED ORDER — SODIUM CHLORIDE 0.9 % IV SOLN: 1.0000 g | Freq: Once | INTRAVENOUS | Status: DC

## 2020-04-16 MED ORDER — PANTOPRAZOLE SODIUM 40 MG PO TBEC
40.0000 mg | DELAYED_RELEASE_TABLET | Freq: Every day | ORAL | Status: DC
  Administered 2020-04-17 – 2020-04-19 (×3): 40 mg via ORAL
  Filled 2020-04-16 (×3): qty 1

## 2020-04-16 NOTE — H&P (Signed)
History and Physical    [name] MVE:[id number] DOB: 1950/09/01 DOA: 04/16/2020  PCP: Letta Median, MD  Patient coming from: boarding house  I have personally briefly reviewed patient's old medical records in [place]  Chief Complaint: cough  HPI: [name] is a 69 y.o. Caucasian male with medical history significant of HTN, asthma, DM2 not on insulin who presented with cough and weakness.   Pt started having dry cough, fever, sore throat and weakness 2 days ago.  Has dyspnea mostly when he coughs.  No N/V/D.  Decreased appetite.  No hx of smoking or COPD, but does have asthma for which he uses only albuterol rescue inhaler.  Pt said he had received his 2-shots COVID-19 vaccine back in June 2021.  Pt lives in a boarding house where many people are sick.   ED Course: initial vitals: temp 102.2, pulse 65, BP 121/88, sating 97% on room air.  Labs notable for lactic acid 2.1, trop 27 and 28, CXR showed "Mild RIGHT basilar atelectasis versus airspace disease/pneumonia."  COVID-19 returned positive.  ED provider requested pt be admitted, which will be observation status for now, and pt is agreeable and notified of his observation status.  Assessment/Plan Active Problems:   COVID-19 virus infection  # COVID-19 infection --Presented with fever, cough, however, no hypoxia.  CXR per my review did not show convincing evidence of PNA.  Pt currently does not warrant treatment with Remdesivir or steroid.  ED provider requested hospitalist admission partly due to pt's living situation.   PLAN: --Place in observation status. --Cough meds, scheduled albuterol inhaler q4h --Hold abx for now since no convincing evidence of bacterial PNA --Procal and CRP  # Lactic acidosis --lactic acid 2.1 on presentation and trended up to 2.5 PLAN: --gentle hydration with NS@100  for 10 hours --recheck lactic acid in the morning  # DM2 not on insulin --Hold home metformin --No need for  BG checks  # HTN --resme home Lasix and Lisinopril tomorrow    DVT prophylaxis: Lovenox SQ Code Status: Full code  Family Communication:   Disposition Plan: boarding house  Consults called: none Admission status: Observation   Review of Systems: As per HPI otherwise 10 point review of systems negative.   Past Medical History:  Diagnosis Date  . Asthma   . CHF (congestive heart failure) ([place])   . COPD (chronic obstructive pulmonary disease) ([place])   . Diabetes mellitus without complication ([place])   . Homelessness   . Hypertension   . Migraine   . Obesity   . Sleep apnea     Past Surgical History:  Procedure Laterality Date  . CARDIAC CATHETERIZATION    . CHOLECYSTECTOMY    . EYE SURGERY    . INNER EAR SURGERY    . NOSE SURGERY       reports that he has quit smoking. He has never used smokeless tobacco. He reports that he does not drink alcohol and does not use drugs.  Allergies  Allergen Reactions  . Eggs Or Egg-Derived Products Nausea And Vomiting  . Novocain [Procaine]   . Sulfa Antibiotics     Family History  Problem Relation Age of Onset  . Hypertension Mother   . Heart disease Mother   . Heart failure Maternal Grandmother      Prior to Admission medications   Medication Sig Start Date End Date Taking? Authorizing Provider  acetaminophen (TYLENOL) 650 MG CR tablet Take 1 tablet (650 mg total) by mouth  2 (two) times daily as needed for pain. 03/31/18   Gouru, Illene Silver, MD  albuterol (PROVENTIL HFA;VENTOLIN HFA) 108 (90 Base) MCG/ACT inhaler Inhale 2 puffs into the lungs every 6 (six) hours as needed for wheezing or shortness of breath. 07/09/18   Rudene Re, MD  aspirin EC 81 MG tablet Take 81 mg by mouth daily.    [provider]  beclomethasone (BECONASE-AQ) 42 MCG/SPRAY nasal spray Place 1 spray into both nostrils 2 (two) times daily. Dose is for each nostril.    [provider]  cetirizine (ZYRTEC) 10 MG tablet 10 mg daily.   07/21/18   [provider]  Dextromethorphan-guaiFENesin (WAL-TUSSIN COUGH/CHEST DM MAX) 10-200 MG/5ML LIQD Take 10 mLs by mouth every 4 (four) hours as needed.    [provider]  Dextrose, Diabetic Use, (DEXTROSE PO) Take by mouth.    [provider]  EPINEPHrine 0.3 mg/0.3 mL IJ SOAJ injection Inject 0.3 mLs (0.3 mg total) into the muscle as needed. 12/02/18   Minna Merritts, MD  furosemide (LASIX) 40 MG tablet Take 2 tablets (80 mg total) by mouth daily. 04/16/18   Alisa Graff, FNP  lisinopril (PRINIVIL,ZESTRIL) 10 MG tablet Take 1 tablet (10 mg total) by mouth daily. 04/16/18   Alisa Graff, FNP  loperamide (LOPERAMIDE A-D) 2 MG tablet Take 4 mg by mouth as needed.     [provider]  meclizine (ANTIVERT) 25 MG tablet Take 25 mg by mouth 2 (two) times daily.     [provider]  metFORMIN (GLUCOPHAGE-XR) 500 MG 24 hr tablet Take 2 tablets (1,000 mg total) by mouth 2 (two) times daily. 03/31/18   Nicholes Mango, MD  Multiple Vitamin (MULTIVITAMIN WITH MINERALS) TABS tablet Take 1 tablet by mouth daily.    [provider]  omeprazole (PRILOSEC) 40 MG capsule Take 1 capsule (40 mg total) by mouth AC breakfast. 03/31/18   Gouru, Illene Silver, MD  ondansetron (ZOFRAN ODT) 4 MG disintegrating tablet Take 1 tablet (4 mg total) by mouth every 8 (eight) hours as needed for nausea or vomiting. 07/29/18   Paulette Blanch, MD  PARoxetine (PAXIL) 10 MG tablet TAKE 1 TABLET BY MOUTH ONCE DAILY FOR MOOD 07/21/18   [provider]  potassium chloride (K-DUR) 10 MEQ tablet Take 1 tablet by mouth daily. 07/29/18   [provider]  promethazine (PHENERGAN) 25 MG tablet Take 25 mg by mouth every 6 (six) hours as needed for nausea or vomiting (before transportation).     [provider]  simvastatin (ZOCOR) 40 MG tablet Take 1 tablet (40 mg total) by mouth daily at 6 PM. 04/16/18   Alisa Graff, FNP  tamsulosin (FLOMAX) 0.4 MG CAPS capsule Take  0.4 mg by mouth daily.    [provider]    Physical Exam: Vitals:   04/16/20 1715 04/16/20 1730 04/16/20 1743 04/16/20 1811  BP:  (!) 142/60 (!) 142/60 (!) 148/72  Pulse: 69 69 70 73  Resp:   20 17  Temp:   98.5 F (36.9 C)   TempSrc:   Oral   SpO2: 97% 96% 97% 96%  Weight:      Height:        Constitutional: NAD, AAOx3 HEENT: conjunctivae and lids normal, EOMI CV: RRR no M,R,G. Distal pulses +2.  No cyanosis.   RESP: CTA B/L, frequent dry cough, normal respiratory effort, on RA GI: +BS, NTND Extremities: No effusions, edema, or tenderness in BLE SKIN: warm, dry  and intact Neuro: II - XII grossly intact.  Sensation intact   Labs on Admission: I have personally reviewed following labs and imaging studies  CBC: Recent Labs  Lab 04/16/20 1546  WBC 3.3*  NEUTROABS 1.9  HGB 12.8*  HCT 37.4*  MCV 85.2  PLT 86*   Basic Metabolic Panel: Recent Labs  Lab 04/16/20 1546  NA 136  K 3.5  CL 100  CO2 25  GLUCOSE 102*  BUN 13  CREATININE 1.08  CALCIUM 9.1   GFR: Estimated Creatinine Clearance: 82.6 mL/min (by C-G formula based on SCr of 1.08 mg/dL). Liver Function Tests: Recent Labs  Lab 04/16/20 1546  AST 84*  ALT 34  ALKPHOS 66  BILITOT 1.4*  PROT 7.4  ALBUMIN 3.0*   No results for input(s): LIPASE, AMYLASE in the last 168 hours. No results for input(s): AMMONIA in the last 168 hours. Coagulation Profile: Recent Labs  Lab 04/16/20 1636  INR 1.0   Cardiac Enzymes: No results for input(s): CKTOTAL, CKMB, CKMBINDEX, TROPONINI in the last 168 hours. BNP (last 3 results) No results for input(s): PROBNP in the last 8760 hours. HbA1C: No results for input(s): HGBA1C in the last 72 hours. CBG: No results for input(s): GLUCAP in the last 168 hours. Lipid Profile: No results for input(s): CHOL, HDL, LDLCALC, TRIG, CHOLHDL, LDLDIRECT in the last 72 hours. Thyroid Function Tests: No results for input(s): TSH, T4TOTAL, FREET4, T3FREE, THYROIDAB in  the last 72 hours. Anemia Panel: No results for input(s): VITAMINB12, FOLATE, FERRITIN, TIBC, IRON, RETICCTPCT in the last 72 hours. Urine analysis:    Component Value Date/Time   COLORURINE AMBER (A) 08/01/2018 2046   APPEARANCEUR CLEAR (A) 08/01/2018 2046   LABSPEC 1.023 08/01/2018 2046   PHURINE 5.0 08/01/2018 2046   GLUCOSEU NEGATIVE 08/01/2018 2046   HGBUR NEGATIVE 08/01/2018 2046   [place] NEGATIVE 08/01/2018 2046   [place] NEGATIVE 08/01/2018 2046   PROTEINUR 30 (A) 08/01/2018 2046   NITRITE NEGATIVE 08/01/2018 2046   LEUKOCYTESUR NEGATIVE 08/01/2018 2046    Radiological Exams on Admission: DG Chest Portable 1 View  Result Date: 04/16/2020 CLINICAL DATA:  Cough and fever. EXAM: PORTABLE CHEST 1 VIEW COMPARISON:  07/09/2018 radiograph and 02/17/2020 CT FINDINGS: The cardiomediastinal silhouette is unremarkable. Mild RIGHT basilar opacity, question atelectasis versus airspace disease. There is no evidence of focal airspace disease, pulmonary edema, suspicious pulmonary nodule/mass, pleural effusion, or pneumothorax. No acute bony abnormalities are identified. IMPRESSION: Mild RIGHT basilar atelectasis versus airspace disease/pneumonia. Electronically Signed   By: Margarette Canada M.D.   On: 04/16/2020 16:09      Enzo Bi MD [facility]  If 7PM-7AM, please contact night-coverage 04/16/2020, 6:19 PM

## 2020-04-16 NOTE — ED Notes (Signed)
Ambulated pt within the room to assess oxygen sats.    Pt  On room air at 97 to 98% while in the stretcher.  Ambulating pt in the  Room on room air sats stayed at 97 to 98% pt speaking in complete sentences with no increase of work of breathing

## 2020-04-16 NOTE — ED Triage Notes (Signed)
Pt states that he has a sinus infection but recently started coughing- pt states he has had his lungs looked at and they are clear- pt states he feels like his throat is swollen

## 2020-04-16 NOTE — ED Notes (Addendum)
Report received, care assumed. Pt is aware of need for urine specimen.

## 2020-04-16 NOTE — Progress Notes (Signed)
PHARMACIST - PHYSICIAN COMMUNICATION  CONCERNING:  Enoxaparin (Lovenox) for DVT Prophylaxis    RECOMMENDATION: Patient was prescribed enoxaprin 40mg  q24 hours for VTE prophylaxis.   Filed Weights   04/16/20 1417  Weight: 133.8 kg (295 lb)    Body mass index is 47.61 kg/m.  Estimated Creatinine Clearance: 82.6 mL/min (by C-G formula based on SCr of 1.08 mg/dL).   Based on [place] patient is candidate for enoxaparin 40mg  every 12 hour dosing due to BMI being >40.   DESCRIPTION: Pharmacy has adjusted enoxaparin dose per [facility] policy.  Patient is now receiving enoxaparin 40mg  every 12 hours.     Rowland Lathe, PharmD Clinical Pharmacist  04/16/2020 7:19 PM

## 2020-04-16 NOTE — ED Notes (Signed)
Pt remains resting in NAD.  

## 2020-04-16 NOTE — ED Notes (Signed)
Introduced self to pt. Pt requesting meal tray. Given meal tray. Denies further needs.

## 2020-04-16 NOTE — ED Notes (Signed)
ED Provider at bedside. 

## 2020-04-16 NOTE — ED Provider Notes (Signed)
[facility] Emergency Department Provider Note  ____________________________________________   First MD Initiated Contact with Patient 04/16/20 1526     (approximate)  I have reviewed the triage vital signs and the nursing notes.   HISTORY  Chief Complaint Cough    HPI [name] is a 69 y.o. male presents emergency department complaining of a severe cough, sore throat, and fever.  States he feels like his throat might be swelling up.  He states he did get [employer] vaccine and a Pneumovax vaccine this year.  He states everyone he lives with does not have the Covid vaccine and everyone is coughing.  He denies chest pain but does feel little short of breath.  States swelling in his lower extremities is worse than normal.  Does have a history of CHF.  Denies any vomiting or diarrhea.  No urinary symptoms.    Past Medical History:  Diagnosis Date  . Asthma   . CHF (congestive heart failure) ([place])   . COPD (chronic obstructive pulmonary disease) ([place])   . Diabetes mellitus without complication ([place])   . Homelessness   . Hypertension   . Migraine   . Obesity   . Sleep apnea     Patient Active Problem List   Diagnosis Date Noted  . COVID-19 virus infection 04/16/2020  . HTN (hypertension) 04/17/2018  . DM (diabetes mellitus) ([place]) 04/17/2018  . Lymphedema 04/17/2018  . Chronic diastolic CHF (congestive heart failure) ([place]) 04/07/2018  . Anemia 04/07/2018  . Bilateral leg pain 03/29/2018  . Chest pain 03/20/2018  . Adjustment disorder with mixed anxiety and depressed mood 12/16/2017  . Homelessness 12/16/2017    Past Surgical History:  Procedure Laterality Date  . CARDIAC CATHETERIZATION    . CHOLECYSTECTOMY    . EYE SURGERY    . INNER EAR SURGERY    . NOSE SURGERY      Prior to Admission medications   Medication Sig Start Date End Date Taking? Authorizing Provider  acetaminophen (TYLENOL) 650 MG CR tablet Take 1 tablet (650 mg total)  by mouth 2 (two) times daily as needed for pain. 03/31/18   Gouru, Illene Silver, MD  albuterol (PROVENTIL HFA;VENTOLIN HFA) 108 (90 Base) MCG/ACT inhaler Inhale 2 puffs into the lungs every 6 (six) hours as needed for wheezing or shortness of breath. 07/09/18   Rudene Re, MD  aspirin EC 81 MG tablet Take 81 mg by mouth daily.    [provider]  beclomethasone (BECONASE-AQ) 42 MCG/SPRAY nasal spray Place 1 spray into both nostrils 2 (two) times daily. Dose is for each nostril.    [provider]  cetirizine (ZYRTEC) 10 MG tablet 10 mg daily.  07/21/18   [provider]  Dextromethorphan-guaiFENesin (WAL-TUSSIN COUGH/CHEST DM MAX) 10-200 MG/5ML LIQD Take 10 mLs by mouth every 4 (four) hours as needed.    [provider]  Dextrose, Diabetic Use, (DEXTROSE PO) Take by mouth.    [provider]  EPINEPHrine 0.3 mg/0.3 mL IJ SOAJ injection Inject 0.3 mLs (0.3 mg total) into the muscle as needed. 12/02/18   Minna Merritts, MD  furosemide (LASIX) 40 MG tablet Take 2 tablets (80 mg total) by mouth daily. 04/16/18   Alisa Graff, FNP  lisinopril (PRINIVIL,ZESTRIL) 10 MG tablet Take 1 tablet (10 mg total) by mouth daily. 04/16/18   Alisa Graff, FNP  loperamide (LOPERAMIDE A-D) 2 MG tablet Take 4 mg by mouth as needed.     [provider]  meclizine (ANTIVERT) 25 MG tablet Take 25 mg by mouth 2 (two) times daily.     [provider]  metFORMIN (GLUCOPHAGE-XR) 500 MG 24 hr tablet Take 2 tablets (1,000 mg total) by mouth 2 (two) times daily. 03/31/18   Nicholes Mango, MD  Multiple Vitamin (MULTIVITAMIN WITH MINERALS) TABS tablet Take 1 tablet by mouth daily.    [provider]  omeprazole (PRILOSEC) 40 MG capsule Take 1 capsule (40 mg total) by mouth AC breakfast. 03/31/18   Gouru, Illene Silver, MD  ondansetron (ZOFRAN ODT) 4 MG disintegrating tablet Take 1 tablet (4 mg total) by mouth every 8 (eight) hours as needed for nausea or vomiting. 07/29/18    Paulette Blanch, MD  PARoxetine (PAXIL) 10 MG tablet TAKE 1 TABLET BY MOUTH ONCE DAILY FOR MOOD 07/21/18   [provider]  potassium chloride (K-DUR) 10 MEQ tablet Take 1 tablet by mouth daily. 07/29/18   [provider]  promethazine (PHENERGAN) 25 MG tablet Take 25 mg by mouth every 6 (six) hours as needed for nausea or vomiting (before transportation).     [provider]  simvastatin (ZOCOR) 40 MG tablet Take 1 tablet (40 mg total) by mouth daily at 6 PM. 04/16/18   Alisa Graff, FNP  tamsulosin (FLOMAX) 0.4 MG CAPS capsule Take 0.4 mg by mouth daily.    [provider]    Allergies Eggs or egg-derived products, Novocain [procaine], and Sulfa antibiotics  Family History  Problem Relation Age of Onset  . Hypertension Mother   . Heart disease Mother   . Heart failure Maternal Grandmother     Social History Social History   Tobacco Use  . Smoking status: Former Research scientist (life sciences)  . Smokeless tobacco: Never Used  Vaping Use  . Vaping Use: Never assessed  Substance Use Topics  . Alcohol use: Never  . Drug use: Never    Review of Systems  Constitutional: Positive fever/chills Eyes: No visual changes. ENT: Positive sore throat. Respiratory: Positive cough Cardiovascular: Denies chest pain Gastrointestinal: Denies abdominal pain Genitourinary: Negative for dysuria. Musculoskeletal: Negative for back pain. Skin: Negative for rash. Psychiatric: no mood changes,     ____________________________________________   PHYSICAL EXAM:  VITAL SIGNS: ED Triage Vitals  Enc Vitals Group     BP 04/16/20 1419 121/88     Pulse Rate 04/16/20 1419 65     Resp 04/16/20 1419 (!) 22     Temp 04/16/20 1419 (!) 102.2 F (39 C)     Temp Source 04/16/20 1419 Oral     SpO2 04/16/20 1419 97 %     Weight 04/16/20 1417 295 lb (133.8 kg)     Height 04/16/20 1417 5\' 6"  (1.676 m)     Head Circumference --      Peak Flow --      Pain Score 04/16/20 1416 8     Pain  Loc --      Pain Edu? --      Excl. in [place]? --     Constitutional: Alert and oriented. Well appearing and in no acute distress. Eyes: Conjunctivae are normal.  Head: Atraumatic. Nose: No congestion/rhinnorhea. Mouth/Throat: Mucous membranes are moist.  Throat has been elongated uvula. Neck:  supple no lymphadenopathy noted Cardiovascular: Normal rate, regular rhythm. Heart sounds are normal, lower extremities have 1+ pitting edema Respiratory: Normal respiratory effort.  No retractions, lungs c t a, increased respirations, patient is coughing Abd: soft nontender bs normal all 4 quad GU:  deferred Musculoskeletal: FROM all extremities, warm and well perfused Neurologic:  Normal speech and language.  Skin:  Skin is warm, dry and intact. No rash noted. Psychiatric: Mood and affect are normal. Speech and behavior are normal.  ____________________________________________   LABS (all labs ordered are listed, but only abnormal results are displayed)  Labs Reviewed  SARS CORONAVIRUS 2 BY RT PCR (HOSPITAL ORDER, [facility]) - Abnormal; Notable for the following components:      Result Value   SARS Coronavirus 2 POSITIVE (*)    All other components within normal limits  COMPREHENSIVE METABOLIC PANEL - Abnormal; Notable for the following components:   Glucose, Bld 102 (*)    Albumin 3.0 (*)    AST 84 (*)    Total Bilirubin 1.4 (*)    All other components within normal limits  CBC WITH DIFFERENTIAL/PLATELET - Abnormal; Notable for the following components:   WBC 3.3 (*)    Hemoglobin 12.8 (*)    HCT 37.4 (*)    Platelets 86 (*)    Lymphs Abs 0.6 (*)    All other components within normal limits  BRAIN NATRIURETIC PEPTIDE - Abnormal; Notable for the following components:   B Natriuretic Peptide 136.1 (*)    All other components within normal limits  LACTIC ACID, PLASMA - Abnormal; Notable for the following components:   Lactic Acid, Venous 2.1 (*)    All  other components within normal limits  TROPONIN I (HIGH SENSITIVITY) - Abnormal; Notable for the following components:   Troponin I (High Sensitivity) 27 (*)    All other components within normal limits  CULTURE, BLOOD (ROUTINE X 2)  CULTURE, BLOOD (ROUTINE X 2)  URINE CULTURE  PROTIME-INR  APTT  LACTIC ACID, PLASMA  URINALYSIS, COMPLETE (UACMP) WITH MICROSCOPIC  BASIC METABOLIC PANEL  CBC  MAGNESIUM  PROCALCITONIN  C-REACTIVE PROTEIN  TROPONIN I (HIGH SENSITIVITY)   ____________________________________________   ____________________________________________  RADIOLOGY  Chest x-ray shows a mild pneumonia  ____________________________________________   PROCEDURES  Procedure(s) performed: No  Procedures    ____________________________________________   INITIAL IMPRESSION / ASSESSMENT AND PLAN / ED COURSE  Pertinent labs & imaging results that were available during my care of the patient were reviewed by me and considered in my medical decision making (see chart for details).   The patient is a 69 year old male presents emergency department Covid-like symptoms.  See HPI.  Also concerns for sepsis due to fever and elevated respirations  Physical exam shows patient to appear stable.  Temperature is at 102.4, pulse is 66, blood pressure is elevated 175/76, respirations are elevated at 22, oxygen saturations at 98% on room air.  Patient is constantly coughing, throat has elongated uvula, lungs do sound clear to all station.  Code sepsis undifferentiated ordered. CBC, lactate, metabolic panel, troponin, BNP, blood cultures, urinalysis, PT/PTT, and Covid test ordered    ----------------------------------------- 4:47 PM on 04/16/2020 -----------------------------------------  Due to the chest x-ray indicating pneumonia we will start Rocephin and azithromycin.  Remainder of his labs including lactic acid are still pending.  However I do feel that the patient will be  admitted.  ----------------------------------------- 5:06 PM on 04/16/2020 -----------------------------------------  Sepsis is confirmed by elevated lactic acid.  Rocephin and Zithromax ordered.  Will wait on fluids as the patient's heart rate in blood pressure are stable.  Due to his CHF do not want to overload him on fluids.  We will discuss this with the hospitalist.  Also discussed whether  the patient could have some Tussionex for his cough.   ----------------------------------------- 5:41 PM on 04/16/2020 -----------------------------------------  Did call the hospitalist.  She is very argumentative about treatment.  She wants to hold all medications.  She does not want to give her Rocephin, Zithromax, or redosed earlier.  States that he has Covid and is not hypoxic so she does not feel he needs to be admitted.  However I told her with his past medical history I do not think he will do well.  I encouraged her to come at least see the patient and admitted for observation.  She is still refusing to give him any medications at this time.  [name] was evaluated in Emergency Department on 04/16/2020 for the symptoms described in the history of present illness. He was evaluated in the context of the global COVID-19 pandemic, which necessitated consideration that the patient might be at risk for infection with the SARS-CoV-2 virus that causes COVID-19. Institutional protocols and algorithms that pertain to the evaluation of patients at risk for COVID-19 are in a state of rapid change based on information released by regulatory bodies including the CDC and federal and state organizations. These policies and algorithms were followed during the patient's care in the ED.    As part of my medical decision making, I reviewed the following data within the [facility] notes reviewed and incorporated, Labs reviewed , EKG interpreted see physician read, Old chart reviewed,  Radiograph reviewed , Discussed with admitting physician , Evaluated by EM attending , Notes from prior ED visits and [place] Controlled Substance Database  ____________________________________________   FINAL CLINICAL IMPRESSION(S) / ED DIAGNOSES  Final diagnoses:  Sepsis due to pneumonia ([place])  COVID-19      NEW MEDICATIONS STARTED DURING THIS VISIT:  New Prescriptions   No medications on file     Note:  This document was prepared using Dragon voice recognition software and may include unintentional dictation errors.    Versie Starks, PA-C 04/16/20 1750    Carrie Mew, MD 04/16/20 [phone]

## 2020-04-17 DIAGNOSIS — K7460 Unspecified cirrhosis of liver: Secondary | ICD-10-CM | POA: Diagnosis present

## 2020-04-17 DIAGNOSIS — Z888 Allergy status to other drugs, medicaments and biological substances status: Secondary | ICD-10-CM | POA: Diagnosis not present

## 2020-04-17 DIAGNOSIS — Z590 Homelessness: Secondary | ICD-10-CM | POA: Diagnosis not present

## 2020-04-17 DIAGNOSIS — Z9049 Acquired absence of other specified parts of digestive tract: Secondary | ICD-10-CM | POA: Diagnosis not present

## 2020-04-17 DIAGNOSIS — D72819 Decreased white blood cell count, unspecified: Secondary | ICD-10-CM

## 2020-04-17 DIAGNOSIS — E876 Hypokalemia: Secondary | ICD-10-CM | POA: Diagnosis present

## 2020-04-17 DIAGNOSIS — Z7984 Long term (current) use of oral hypoglycemic drugs: Secondary | ICD-10-CM | POA: Diagnosis not present

## 2020-04-17 DIAGNOSIS — E872 Acidosis, unspecified: Secondary | ICD-10-CM

## 2020-04-17 DIAGNOSIS — R7401 Elevation of levels of liver transaminase levels: Secondary | ICD-10-CM | POA: Diagnosis not present

## 2020-04-17 DIAGNOSIS — Z6841 Body Mass Index (BMI) 40.0 and over, adult: Secondary | ICD-10-CM | POA: Diagnosis not present

## 2020-04-17 DIAGNOSIS — Z79899 Other long term (current) drug therapy: Secondary | ICD-10-CM | POA: Diagnosis not present

## 2020-04-17 DIAGNOSIS — J1282 Pneumonia due to coronavirus disease 2019: Secondary | ICD-10-CM | POA: Diagnosis present

## 2020-04-17 DIAGNOSIS — U071 COVID-19: Secondary | ICD-10-CM | POA: Diagnosis present

## 2020-04-17 DIAGNOSIS — I110 Hypertensive heart disease with heart failure: Secondary | ICD-10-CM | POA: Diagnosis present

## 2020-04-17 DIAGNOSIS — D696 Thrombocytopenia, unspecified: Secondary | ICD-10-CM | POA: Diagnosis present

## 2020-04-17 DIAGNOSIS — E119 Type 2 diabetes mellitus without complications: Secondary | ICD-10-CM

## 2020-04-17 DIAGNOSIS — A4189 Other specified sepsis: Secondary | ICD-10-CM | POA: Diagnosis present

## 2020-04-17 DIAGNOSIS — I5032 Chronic diastolic (congestive) heart failure: Secondary | ICD-10-CM | POA: Diagnosis present

## 2020-04-17 DIAGNOSIS — J440 Chronic obstructive pulmonary disease with acute lower respiratory infection: Secondary | ICD-10-CM | POA: Diagnosis present

## 2020-04-17 DIAGNOSIS — G4730 Sleep apnea, unspecified: Secondary | ICD-10-CM | POA: Diagnosis present

## 2020-04-17 DIAGNOSIS — Z91012 Allergy to eggs: Secondary | ICD-10-CM | POA: Diagnosis not present

## 2020-04-17 DIAGNOSIS — B1920 Unspecified viral hepatitis C without hepatic coma: Secondary | ICD-10-CM | POA: Diagnosis present

## 2020-04-17 DIAGNOSIS — Z882 Allergy status to sulfonamides status: Secondary | ICD-10-CM | POA: Diagnosis not present

## 2020-04-17 DIAGNOSIS — J189 Pneumonia, unspecified organism: Secondary | ICD-10-CM | POA: Diagnosis present

## 2020-04-17 DIAGNOSIS — Z7982 Long term (current) use of aspirin: Secondary | ICD-10-CM | POA: Diagnosis not present

## 2020-04-17 LAB — CBC
HCT: 35.9 % — ABNORMAL LOW (ref 39.0–52.0)
Hemoglobin: 12 g/dL — ABNORMAL LOW (ref 13.0–17.0)
MCH: 29.1 pg (ref 26.0–34.0)
MCHC: 33.4 g/dL (ref 30.0–36.0)
MCV: 86.9 fL (ref 80.0–100.0)
Platelets: 76 10*3/uL — ABNORMAL LOW (ref 150–400)
RBC: 4.13 MIL/uL — ABNORMAL LOW (ref 4.22–5.81)
RDW: 15.1 % (ref 11.5–15.5)
WBC: 3.2 10*3/uL — ABNORMAL LOW (ref 4.0–10.5)
nRBC: 0 % (ref 0.0–0.2)

## 2020-04-17 LAB — LACTIC ACID, PLASMA: Lactic Acid, Venous: 1.9 mmol/L (ref 0.5–1.9)

## 2020-04-17 LAB — BASIC METABOLIC PANEL
Anion gap: 9 (ref 5–15)
BUN: 14 mg/dL (ref 8–23)
CO2: 25 mmol/L (ref 22–32)
Calcium: 8.7 mg/dL — ABNORMAL LOW (ref 8.9–10.3)
Chloride: 101 mmol/L (ref 98–111)
Creatinine, Ser: 1.15 mg/dL (ref 0.61–1.24)
GFR calc Af Amer: >60 mL/min (ref >60)
GFR calc non Af Amer: >60 mL/min (ref >60)
Glucose, Bld: 89 mg/dL (ref 70–99)
Potassium: 3.2 mmol/L — ABNORMAL LOW (ref 3.5–5.1)
Sodium: 135 mmol/L (ref 135–145)

## 2020-04-17 LAB — MRSA PCR SCREENING: MRSA by PCR: NEGATIVE

## 2020-04-17 LAB — MAGNESIUM: Magnesium: 1.6 mg/dL — ABNORMAL LOW (ref 1.7–2.4)

## 2020-04-17 LAB — HIV ANTIBODY (ROUTINE TESTING W REFLEX): HIV Screen 4th Generation wRfx: NONREACTIVE

## 2020-04-17 LAB — C-REACTIVE PROTEIN: CRP: 1.1 mg/dL — ABNORMAL HIGH (ref <1.0)

## 2020-04-17 MED ORDER — LOPERAMIDE HCL 2 MG PO CAPS
4.0000 mg | ORAL_CAPSULE | ORAL | Status: DC | PRN
  Administered 2020-04-17 – 2020-04-18 (×2): 4 mg via ORAL
  Filled 2020-04-17 (×2): qty 2

## 2020-04-17 MED ORDER — POTASSIUM CHLORIDE CRYS ER 20 MEQ PO TBCR
40.0000 meq | EXTENDED_RELEASE_TABLET | Freq: Once | ORAL | Status: AC
  Administered 2020-04-17: 40 meq via ORAL
  Filled 2020-04-17: qty 2

## 2020-04-17 MED ORDER — MAGNESIUM SULFATE 2 GM/50ML IV SOLN
2.0000 g | Freq: Once | INTRAVENOUS | Status: AC
  Administered 2020-04-17: 2 g via INTRAVENOUS
  Filled 2020-04-17: qty 50

## 2020-04-17 MED ORDER — SODIUM CHLORIDE 0.9 % IV SOLN
100.0000 mg | Freq: Every day | INTRAVENOUS | Status: DC
  Administered 2020-04-18 – 2020-04-19 (×2): 100 mg via INTRAVENOUS
  Filled 2020-04-17 (×3): qty 20

## 2020-04-17 MED ORDER — SODIUM CHLORIDE 0.9 % IV SOLN
200.0000 mg | Freq: Once | INTRAVENOUS | Status: AC
  Administered 2020-04-17: 200 mg via INTRAVENOUS
  Filled 2020-04-17: qty 200

## 2020-04-17 NOTE — Assessment & Plan Note (Addendum)
--  remained stable; resume metformin on discharge

## 2020-04-17 NOTE — Hospital Course (Addendum)
69 year old man PMH asthma, diabetes mellitus type 2 not on insulin, presented with cough and weakness.  Fully vaccinated June 2021.  Admitted for COVID-19 pneumonia without hypoxia.

## 2020-04-17 NOTE — Assessment & Plan Note (Addendum)
--  chronic, stable, f/u as outpt

## 2020-04-17 NOTE — ED Notes (Signed)
Pt ambulatory to bathroom without assistance, does report some dyspnea with exertion. Pt currently on RA, inhaler given to use, tylenol given for temp and cough syrup also given. Pt able to speak in complete sentences without running out of breath once in seat position. Denies any further needs at this time, pt sheets changed due to coming off stretcher and pt given pillow. Declined blanket at this time.  Pt has personal keys on lanyard around his neck.

## 2020-04-17 NOTE — ED Notes (Signed)
Breakfast tray given. °

## 2020-04-17 NOTE — Assessment & Plan Note (Signed)
--  resolved, sepsis ruled out

## 2020-04-17 NOTE — Progress Notes (Signed)
PROGRESS NOTE  [name] GNF:[id number] DOB: 1951-02-01 DOA: 04/16/2020 PCP: Letta Median, MD  Brief History   69 year old man PMH asthma, diabetes mellitus type 2 not on insulin, presented with cough and weakness.  Fully vaccinated June 2021.  Admitted for COVID-19 pneumonia without hypoxia.   A & P  COVID-19 virus infection Pneumonia secondary to COVID-19 infection --Presented with fever, cough but no hypoxia.    --CXR Mild RIGHT basilar atelectasis versus airspace disease/pneumonia. --oxygen RA --inflammatory markers not checked --Tx . Remdesiver 8/22 >  . Steroids not indicated . Actemra/baricitinim not indicated  DM (diabetes mellitus) ([place]) --stable; Hold home Metformin  Lactic acidosis --resolved, sepsis ruled out  Thrombocytopenia (HCC) --chronic, f/u as outpt  Leukopenia --chronic, followup as outpt  Hypokalemia --replete  Hypomagnesemia -replete  Disposition Plan:  Discussion: coughing, SOB, start remdesivir  Status is: Inpatient  Remains inpatient appropriate because:IV treatments appropriate due to intensity of illness or inability to take PO   Dispo: The patient is from: Home              Anticipated d/c is to: Home              Anticipated d/c date is: 1 day              Patient currently is not medically stable to d/c.  DVT prophylaxis: enoxaparin (LOVENOX) injection 40 mg Start: 04/16/20 2200 Code Status: Full Family Communication: none  Murray Hodgkins, MD  [facility] Direct contact: see [email or web address] (further directions at bottom of note if needed) 7PM-7AM contact night coverage as at bottom of note 04/17/2020, 2:50 PM  LOS: 0 days   Significant Hospital Events   .    Consults:  .    Procedures:  .   Significant Diagnostic Tests:  Marland Kitchen    Micro Data:  .    Antimicrobials:  .   Interval History/Subjective  Coughing and SOB at times. No pain  Objective   Vitals:  Vitals:   04/17/20 1011 04/17/20 1400    BP:  (!) 137/57  Pulse:  66  Resp:  (!) 22  Temp: 100 F (37.8 C)   SpO2:  91%    Exam:  Constitutional:   . Appears calm and comfortable ENMT:  . grossly normal hearing  Respiratory:  . CTA bilaterally, no w/r/r.  . Respiratory effort appears stable Cardiovascular:  . RRR, no m/r/g . No significant LE extremity edema   Psychiatric:  . Mental status o Mood, affect appropriate  I have personally reviewed the following:   Today's Data  . K+ 3.2 . Lactic acid norml . WBC 3.2 stable, chronic . Plts 76, stable, chronic  Scheduled Meds: . albuterol  2 puff Inhalation Q4H  . aspirin EC  81 mg Oral Daily  . enoxaparin (LOVENOX) injection  40 mg Subcutaneous Q12H  . furosemide  80 mg Oral Daily  . lisinopril  10 mg Oral Daily  . pantoprazole  40 mg Oral Daily  . PARoxetine  10 mg Oral Daily  . simvastatin  40 mg Oral q1800  . tamsulosin  0.4 mg Oral Daily   Continuous Infusions: . remdesivir 200 mg in sodium chloride 0.9% 250 mL IVPB 200 mg (04/17/20 1425)   Followed by  . [START ON 04/18/2020] remdesivir 100 mg in NS 100 mL      Active Problems:   Thrombocytopenia ([place])   DM (diabetes mellitus) ([place])   COVID-19 virus infection   Lactic acidosis  Leukopenia   LOS: 0 days   How to contact the [facility] Attending or Consulting provider [place] or covering provider during after hours [place], for this patient?  1. Check the care team in [facility] and look for a) attending/consulting TRH provider listed and b) the [facility] team listed 2. Log into [email or web address] and use [place]'s universal password to access. If you do not have the password, please contact the hospital operator. 3. Locate the [facility] provider you are looking for under [facility] and page to a number that you can be directly reached. 4. If you still have difficulty reaching the provider, please page the [facility] (Director on Call) for the Hospitalists listed on amion for assistance.

## 2020-04-17 NOTE — Assessment & Plan Note (Signed)
--  chronic, followup as outpt

## 2020-04-17 NOTE — ED Notes (Signed)
Lunch tray given. 

## 2020-04-17 NOTE — ED Notes (Signed)
Pt resting quietly at this time, respirations equal and unlabored, pt will occasionally have coughing spells, pt has been taking cough syrup PRN and using inhaler.

## 2020-04-17 NOTE — Assessment & Plan Note (Addendum)
Pneumonia secondary to COVID-19 infection --Overall improved.  No hypoxia.  No further low-grade temperatures. --Presented with fever, cough but no hypoxia.    --Admission CXR Mild RIGHT basilar atelectasis versus airspace disease/pneumonia. --oxygen RA on discharge --inflammatory markers   Ferritin 58  CRP 1.1 > 1.4 > 1.2  Fibrin derivatives not checked --Tx . Remdesiver 8/22 > 8/26.  Will complete last 2 doses of remdesivir at the outpatient infusion clinic at [facility].  This has been arranged . Steroids not indicated . Actemra/baricitinim not indicated

## 2020-04-17 NOTE — Plan of Care (Signed)
Pt admitted from the ED.  VSS.  O2 sats in the high 90's on RA.  Encouraged to use the incentive spirometer.  Ambulated to the bathroom with assist.  Pt c/o diarrhea. Dr. Sarajane Jews notified and placed and order for Imodium.

## 2020-04-17 NOTE — Consult Note (Signed)
Remdesivir - Pharmacy Brief Note   O:  ALT: 34 CXR: "mild RIGHT basilar atelectasis versus airspace disease/pneumonia" SpO2: 90% on RA   A/P:  04/16/20 SARS-CoV-2 PCR (+)  Remdesivir 200 mg IVPB once followed by 100 mg IVPB daily x 4 days.   [name] 04/17/2020 12:07 PM

## 2020-04-18 DIAGNOSIS — E66813 Obesity, class 3: Secondary | ICD-10-CM

## 2020-04-18 DIAGNOSIS — R7401 Elevation of levels of liver transaminase levels: Secondary | ICD-10-CM

## 2020-04-18 LAB — FERRITIN: Ferritin: 58 ng/mL (ref 24–336)

## 2020-04-18 LAB — BASIC METABOLIC PANEL
Anion gap: 9 (ref 5–15)
BUN: 18 mg/dL (ref 8–23)
CO2: 25 mmol/L (ref 22–32)
Calcium: 8.6 mg/dL — ABNORMAL LOW (ref 8.9–10.3)
Chloride: 102 mmol/L (ref 98–111)
Creatinine, Ser: 1.25 mg/dL — ABNORMAL HIGH (ref 0.61–1.24)
GFR calc Af Amer: >60 mL/min (ref >60)
GFR calc non Af Amer: 58 mL/min — ABNORMAL LOW (ref >60)
Glucose, Bld: 87 mg/dL (ref 70–99)
Potassium: 3.7 mmol/L (ref 3.5–5.1)
Sodium: 136 mmol/L (ref 135–145)

## 2020-04-18 LAB — HEPATIC FUNCTION PANEL
ALT: 36 U/L (ref 0–44)
AST: 94 U/L — ABNORMAL HIGH (ref 15–41)
Albumin: 2.6 g/dL — ABNORMAL LOW (ref 3.5–5.0)
Alkaline Phosphatase: 51 U/L (ref 38–126)
Bilirubin, Direct: 0.3 mg/dL — ABNORMAL HIGH (ref 0.0–0.2)
Indirect Bilirubin: 0.8 mg/dL (ref 0.3–0.9)
Total Bilirubin: 1.1 mg/dL (ref 0.3–1.2)
Total Protein: 6.2 g/dL — ABNORMAL LOW (ref 6.5–8.1)

## 2020-04-18 LAB — CBC
HCT: 34.1 % — ABNORMAL LOW (ref 39.0–52.0)
Hemoglobin: 11.2 g/dL — ABNORMAL LOW (ref 13.0–17.0)
MCH: 28.7 pg (ref 26.0–34.0)
MCHC: 32.8 g/dL (ref 30.0–36.0)
MCV: 87.4 fL (ref 80.0–100.0)
Platelets: 76 10*3/uL — ABNORMAL LOW (ref 150–400)
RBC: 3.9 MIL/uL — ABNORMAL LOW (ref 4.22–5.81)
RDW: 15.5 % (ref 11.5–15.5)
WBC: 2.1 10*3/uL — ABNORMAL LOW (ref 4.0–10.5)
nRBC: 0 % (ref 0.0–0.2)

## 2020-04-18 LAB — URINE CULTURE: Culture: 10000 — AB

## 2020-04-18 LAB — MAGNESIUM: Magnesium: 1.9 mg/dL (ref 1.7–2.4)

## 2020-04-18 LAB — C-REACTIVE PROTEIN: CRP: 1.4 mg/dL — ABNORMAL HIGH (ref <1.0)

## 2020-04-18 MED ORDER — ASCORBIC ACID 500 MG PO TABS
1000.0000 mg | ORAL_TABLET | Freq: Every day | ORAL | Status: DC
  Administered 2020-04-18 – 2020-04-19 (×2): 1000 mg via ORAL
  Filled 2020-04-18 (×2): qty 2

## 2020-04-18 MED ORDER — ZINC SULFATE 220 (50 ZN) MG PO CAPS
220.0000 mg | ORAL_CAPSULE | Freq: Every day | ORAL | Status: DC
  Administered 2020-04-18 – 2020-04-19 (×2): 220 mg via ORAL
  Filled 2020-04-18 (×2): qty 1

## 2020-04-18 NOTE — Progress Notes (Addendum)
PROGRESS NOTE  [name] GNF:[id number] DOB: 1950/08/29 DOA: 04/16/2020 PCP: Letta Median, MD  Brief History   69 year old man PMH asthma, diabetes mellitus type 2 not on insulin, presented with cough and weakness.  Fully vaccinated June 2021.  Admitted for COVID-19 pneumonia without hypoxia.   A & P  COVID-19 virus infection Pneumonia secondary to COVID-19 infection --better today, but still coughing and SOB when lying down. Low grade temp today. --Presented with fever, cough but no hypoxia.    --CXR Mild RIGHT basilar atelectasis versus airspace disease/pneumonia. --oxygen RA --inflammatory markers   Ferritin 58  CRP 1.1 >  pending   Fibrin derivatives not checked --Tx . Remdesiver 8/22 >  . Steroids not indicated . Actemra/baricitinim not indicated  DM (diabetes mellitus) ([place]) --remains stable on SSI; Hold home Metformin while inpt  Lactic acidosis --resolved, sepsis ruled out  Thrombocytopenia (HCC) --chronic, stable, f/u as outpt  Leukopenia --chronic, followup as outpt  Elevated transaminase level --ALT, chronic intermittent, higher now --CMP in AM --has h/o hepatitis C, cirrhosis > follow-up as outpt  Obesity, Class III, BMI 40-49.9 (morbid obesity) ([place]) --dietician consult  Hypokalemia --replete  Hypomagnesemia -replete  Disposition Plan:  Discussion: Low grade temp today; SOB, will continue current Rx, if stable w/o hypoxia and afebrile, likely discharge 8/24 w/ setup for outpatient remdesivir.  Status is: Inpatient  Remains inpatient appropriate because:IV treatments appropriate due to intensity of illness or inability to take PO   Dispo: The patient is from: Home              Anticipated d/c is to: Home              Anticipated d/c date is: 1 day              Patient currently is not medically stable to d/c.  DVT prophylaxis: enoxaparin (LOVENOX) injection 40 mg Start: 04/16/20 2200 Code Status: Full Family Communication:  none  Murray Hodgkins, MD  [facility] Direct contact: see [email or web address] (further directions at bottom of note if needed) 7PM-7AM contact night coverage as at bottom of note 04/18/2020, 4:06 PM  LOS: 1 day   Significant Hospital Events   .    Consults:  .    Procedures:  .   Significant Diagnostic Tests:  Marland Kitchen    Micro Data:  .    Antimicrobials:  .   Interval History/Subjective  SOB when lying down; coughing  Objective   Vitals:  Vitals:   04/18/20 0842 04/18/20 1231  BP: (!) 149/70 117/63  Pulse: 64 61  Resp: 20 17  Temp: (!) 100.7 F (38.2 C) 99.8 F (37.7 C)  SpO2: 99% 97%    Exam: Constitutional:   . Appears calm and comfortable ENMT:  . grossly normal hearing  Respiratory:  . Fair air movement, no w/r/r . Respiratory effort mildly increased; coughs Cardiovascular:  . RRR, no m/r/g Psychiatric:  . Mental status o Mood, affect appropriate  I have personally reviewed the following:   Today's Data  . Cr 1.25, stable . Plts stable, 76  Scheduled Meds: . albuterol  2 puff Inhalation Q4H  . vitamin C  1,000 mg Oral Daily  . aspirin EC  81 mg Oral Daily  . enoxaparin (LOVENOX) injection  40 mg Subcutaneous Q12H  . furosemide  80 mg Oral Daily  . lisinopril  10 mg Oral Daily  . pantoprazole  40 mg Oral Daily  . PARoxetine  10 mg Oral Daily  .  simvastatin  40 mg Oral q1800  . tamsulosin  0.4 mg Oral Daily  . zinc sulfate  220 mg Oral Daily   Continuous Infusions: . remdesivir 100 mg in NS 100 mL 100 mg (04/18/20 0922)    Principal Problem:   COVID-19 virus infection Active Problems:   DM (diabetes mellitus) (HCC)   Thrombocytopenia (HCC)   Elevated transaminase level   Lactic acidosis   Leukopenia   Obesity, Class III, BMI 40-49.9 (morbid obesity) ([place])   LOS: 1 day   How to contact the [facility] Attending or Consulting provider [place] or covering provider during after hours [place], for this patient?  1. Check the care team in [facility] and  look for a) attending/consulting TRH provider listed and b) the [facility] team listed 2. Log into [email or web address] and use [place]'s universal password to access. If you do not have the password, please contact the hospital operator. 3. Locate the [facility] provider you are looking for under [facility] and page to a number that you can be directly reached. 4. If you still have difficulty reaching the provider, please page the [facility] (Director on Call) for the Hospitalists listed on amion for assistance.

## 2020-04-18 NOTE — TOC Progression Note (Signed)
Transition of Care [facility]) - Progression Note    Patient Details  Name: [name] MRN: [id number] Date of Birth: 1951-02-16  Transition of [facility]) CM/SW Contact  Shelbie Hutching, RN Phone Number: 04/18/2020, 2:16 PM  Clinical Narrative:    Patient is scheduled for [facility] for tomorrow at 1230.     Expected Discharge Plan: Home/Self Care Barriers to Discharge: Continued Medical Work up  Expected Discharge Plan and Services Expected Discharge Plan: Home/Self Care   Discharge Planning Services: CM Consult   Living arrangements for the past 2 months: Single Family Home                                       Social Determinants of Health (SDOH) Interventions    Readmission Risk Interventions No flowsheet data found.

## 2020-04-18 NOTE — Assessment & Plan Note (Addendum)
--  AST, chronic intermittent, stable --has h/o hepatitis C, cirrhosis > follow-up as outpt

## 2020-04-18 NOTE — Assessment & Plan Note (Signed)
--  dietician consult 

## 2020-04-18 NOTE — TOC Initial Note (Signed)
Transition of Care [facility]) - Initial/Assessment Note    Patient Details  Name: [name] MRN: [id number] Date of Birth: 1951/03/17  Transition of Care [facility]) CM/SW Contact:    Shelbie Hutching, RN Phone Number: 04/18/2020, 12:15 PM  Clinical Narrative:                 Patient admitted to the hospital with COVID not currently requiring supplemental oxygen.  Patient is from home where he lives alone.  Patient reports that he is very independent and sometimes uses a cane.  Patient is able to walk to [facility] and to [facility] where he gets primary care services and prescriptions.  Patient does not drive, he does not have a car and his license expired.  Patient has 2 daughters but they live in other states, he has no family that lives close by.  Expected patient discharge tomorrow.  Patient reports that he will need a ride.  RNCM will set up transportation with [facility].  Consult received for medication assistance and patient reports that he does have a hard time with his copays but he has Medicare and Medicaid and the copays are less than $1 most of the time, TOC team is unable to assist with medications.   No other needs identified at this time.    Expected Discharge Plan: Home/Self Care Barriers to Discharge: Continued Medical Work up   Patient Goals and CMS Choice Patient states their goals for this hospitalization and ongoing recovery are:: Patient wants meals from [place] set up once he is discharged and he needs a ride home      Expected Discharge Plan and Services Expected Discharge Plan: Home/Self Care   Discharge Planning Services: CM Consult   Living arrangements for the past 2 months: Single Family Home                                      Prior Living Arrangements/Services Living arrangements for the past 2 months: Single Family Home Lives with:: Self Patient language and need for interpreter reviewed:: Yes Do you feel safe going back to the  place where you live?: Yes      Need for Family Participation in Patient Care: No (Comment) Care giver support system in place?: No (comment) Current home services: DME (cane) Criminal Activity/Legal Involvement Pertinent to Current Situation/Hospitalization: No - Comment as needed  Activities of Daily Living Home Assistive Devices/Equipment: Eyeglasses, Dentures (specify type) ADL Screening (condition at time of admission) Patient's cognitive ability adequate to safely complete daily activities?: Yes Is the patient deaf or have difficulty hearing?: No Does the patient have difficulty seeing, even when wearing glasses/contacts?: No Does the patient have difficulty concentrating, remembering, or making decisions?: No Patient able to express need for assistance with ADLs?: Yes Does the patient have difficulty dressing or bathing?: No Independently performs ADLs?: Yes (appropriate for developmental age) Does the patient have difficulty walking or climbing stairs?: No Weakness of Legs: None Weakness of Arms/Hands: None  Permission Sought/Granted                  Emotional Assessment   Attitude/Demeanor/Rapport: Engaged Affect (typically observed): Accepting, Pleasant Orientation: : Oriented to Self, Oriented to Place, Oriented to  Time, Oriented to Situation Alcohol / Substance Use: Not Applicable Psych Involvement: No (comment)  Admission diagnosis:  Sepsis due to pneumonia ([place]) [J18.9, A41.9] COVID-19 virus infection [U07.1]  COVID-19 [U07.1] Patient Active Problem List   Diagnosis Date Noted  . Lactic acidosis 04/17/2020  . Thrombocytopenia ([place]) 04/17/2020  . Leukopenia 04/17/2020  . COVID-19 virus infection 04/16/2020  . HTN (hypertension) 04/17/2018  . DM (diabetes mellitus) ([place]) 04/17/2018  . Lymphedema 04/17/2018  . Chronic diastolic CHF (congestive heart failure) ([place]) 04/07/2018  . Anemia 04/07/2018  . Bilateral leg pain 03/29/2018  . Chest pain 03/20/2018   . Adjustment disorder with mixed anxiety and depressed mood 12/16/2017  . Homelessness 12/16/2017   PCP:  Letta Median, MD Pharmacy:   [place], [place] [address] [place] [address] Phone: [phone] Fax: [phone]     Social Determinants of Health (SDOH) Interventions    Readmission Risk Interventions No flowsheet data found.

## 2020-04-18 NOTE — Progress Notes (Addendum)
[facility] Set up for Patient.   Patient scheduled for outpatient Remdesivir infusions at 3pm on Wednesday 8/25 and Thursday 8/26 at [facility]. Please inform the patient to park at [place], as staff will be escorting the patient through the [place] entrance of the hospital.    There is a wave flag banner located near the entrance on [address]. Turn into this entrance and immediately turn left and park in 1 of the 5 designated Covid Infusion Parking spots. There is a phone number on the sign, please call and let the staff know what spot you are in and we will come out and get you. For questions call [phone].  Thanks.

## 2020-04-19 ENCOUNTER — Ambulatory Visit (HOSPITAL_COMMUNITY): Payer: Medicare HMO

## 2020-04-19 DIAGNOSIS — U071 COVID-19: Secondary | ICD-10-CM

## 2020-04-19 DIAGNOSIS — J1282 Pneumonia due to coronavirus disease 2019: Secondary | ICD-10-CM

## 2020-04-19 LAB — COMPREHENSIVE METABOLIC PANEL
ALT: 36 U/L (ref 0–44)
AST: 92 U/L — ABNORMAL HIGH (ref 15–41)
Albumin: 2.6 g/dL — ABNORMAL LOW (ref 3.5–5.0)
Alkaline Phosphatase: 51 U/L (ref 38–126)
Anion gap: 9 (ref 5–15)
BUN: 21 mg/dL (ref 8–23)
CO2: 27 mmol/L (ref 22–32)
Calcium: 8.7 mg/dL — ABNORMAL LOW (ref 8.9–10.3)
Chloride: 102 mmol/L (ref 98–111)
Creatinine, Ser: 1.41 mg/dL — ABNORMAL HIGH (ref 0.61–1.24)
GFR calc Af Amer: 58 mL/min — ABNORMAL LOW (ref >60)
GFR calc non Af Amer: 50 mL/min — ABNORMAL LOW (ref >60)
Glucose, Bld: 80 mg/dL (ref 70–99)
Potassium: 3.7 mmol/L (ref 3.5–5.1)
Sodium: 138 mmol/L (ref 135–145)
Total Bilirubin: 1 mg/dL (ref 0.3–1.2)
Total Protein: 6.2 g/dL — ABNORMAL LOW (ref 6.5–8.1)

## 2020-04-19 LAB — CBC
HCT: 33.4 % — ABNORMAL LOW (ref 39.0–52.0)
Hemoglobin: 11 g/dL — ABNORMAL LOW (ref 13.0–17.0)
MCH: 29.6 pg (ref 26.0–34.0)
MCHC: 32.9 g/dL (ref 30.0–36.0)
MCV: 89.8 fL (ref 80.0–100.0)
Platelets: 75 10*3/uL — ABNORMAL LOW (ref 150–400)
RBC: 3.72 MIL/uL — ABNORMAL LOW (ref 4.22–5.81)
RDW: 15.4 % (ref 11.5–15.5)
WBC: 2.2 10*3/uL — ABNORMAL LOW (ref 4.0–10.5)
nRBC: 0 % (ref 0.0–0.2)

## 2020-04-19 LAB — C-REACTIVE PROTEIN: CRP: 1.2 mg/dL — ABNORMAL HIGH (ref <1.0)

## 2020-04-19 MED ORDER — ZINC SULFATE 220 (50 ZN) MG PO CAPS
220.0000 mg | ORAL_CAPSULE | Freq: Every day | ORAL | Status: DC
Start: 2020-04-20 — End: 2021-03-09

## 2020-04-19 MED ORDER — FUROSEMIDE 40 MG PO TABS: 40.0000 mg | ORAL_TABLET | Freq: Every day | ORAL | Status: DC

## 2020-04-19 MED ORDER — ASCORBIC ACID 500 MG PO TABS
500.0000 mg | ORAL_TABLET | Freq: Every day | ORAL | Status: DC
Start: 2020-04-20 — End: 2023-06-21

## 2020-04-19 NOTE — Discharge Summary (Signed)
Physician Discharge Summary  [name] UXL:[id number] DOB: 11-Jun-1951 DOA: 04/16/2020  PCP: Letta Median, MD  Admit date: 04/16/2020 Discharge date: 04/19/2020  Recommendations for Outpatient Follow-up:  1. Complete outpatient remdesivir infusion 2. Reported history of hepatitis C, elevated transaminases, thrombocytopenia, cirrhosis   Follow-up Information    Bender, Durene Cal, MD. Schedule an appointment as soon as possible for a visit in 1 week(s).   Specialty: Family Medicine Contact information: [place] [address] [phone]                Discharge Diagnoses: Principal diagnosis is #1 Principal Problem:   COVID-19 virus infection Active Problems:   DM (diabetes mellitus) ([place])   Thrombocytopenia (HCC)   Elevated transaminase level   Lactic acidosis   Leukopenia   Obesity, Class III, BMI 40-49.9 (morbid obesity) ([place])   Pneumonia due to COVID-19 virus   Discharge Condition: improved Disposition: home  Diet recommendation: heart healthy  Filed Weights   04/16/20 1417  Weight: 133.8 kg    History of present illness:  69 year old man PMH asthma, diabetes mellitus type 2 not on insulin, presented with cough and weakness.  Fully vaccinated June 2021.  Admitted for COVID-19 pneumonia without hypoxia.  Hospital Course:  Was treated for COVID-19 pneumonia with gradual clinical improvement.  Will complete remdesivir as an outpatient.  Individual issues as below.  COVID-19 virus infection Pneumonia secondary to COVID-19 infection --Overall improved.  No hypoxia.  No further low-grade temperatures. --Presented with fever, cough but no hypoxia.    --Admission CXR Mild RIGHT basilar atelectasis versus airspace disease/pneumonia. --oxygen RA on discharge --inflammatory markers   Ferritin 58  CRP 1.1 > 1.4 > 1.2  Fibrin derivatives not checked --Tx . Remdesiver 8/22 > 8/26.  Will complete last 2 doses of  remdesivir at the outpatient infusion clinic at [facility].  This has been arranged . Steroids not indicated . Actemra/baricitinim not indicated  DM (diabetes mellitus) ([place]) --remained stable; resume metformin on discharge  Lactic acidosis --resolved, sepsis ruled out  Thrombocytopenia (HCC) --chronic, stable, f/u as outpt  Leukopenia --chronic, followup as outpt  Elevated transaminase level --AST, chronic intermittent, stable --has h/o hepatitis C, cirrhosis > follow-up as outpt  Obesity, Class III, BMI 40-49.9 (morbid obesity) ([place]) --dietician consult  Today's assessment: S: coughing, but doing wok O: Vitals:  Vitals:   04/19/20 0818 04/19/20 1152  BP: (!) 148/69 (!) 134/117  Pulse: (!) 57 (!) 51  Resp: 18 18  Temp: 98 F (36.7 C) 98.6 F (37 C)  SpO2: 99% 95%    Constitutional:  . Appears calm and comfortable Respiratory:  . CTA bilaterally, no w/r/r.  . Respiratory effort normal. Cardiovascular:  . RRR, no m/r/g Psychiatric:  . Mental status o Mood, affect appropriate  Creatinine slightly up at 1.41 AST stable at 92 Plts stable 75  Discharge Instructions  Discharge Instructions    Diet - low sodium heart healthy   Complete by: As directed    Discharge instructions   Complete by: As directed    Call your physician or seek immediate medical attention for fever, shortness of breath or worsening of condition. Self isolate until September 10. Be sure to complete remdesivir at [facility].   Increase activity slowly   Complete by: As directed      Allergies as of 04/19/2020      Reactions   Eggs Or Egg-derived Products Nausea And Vomiting   Novocain [procaine]  Sulfa Antibiotics       Medication List    TAKE these medications   albuterol 108 (90 Base) MCG/ACT inhaler Commonly known as: VENTOLIN HFA Inhale 2 puffs into the lungs every 6 (six) hours as needed for wheezing or shortness of breath.   ascorbic acid 500 MG  tablet Commonly known as: VITAMIN C Take 1 tablet (500 mg total) by mouth daily. Start taking on: April 20, 2020   cetirizine 10 MG tablet Commonly known as: ZYRTEC Take 5 mg by mouth daily.   cyclobenzaprine 5 MG tablet Commonly known as: FLEXERIL Take 5 mg by mouth at bedtime as needed for muscle spasms.   furosemide 40 MG tablet Commonly known as: LASIX Take 1 tablet (40 mg total) by mouth daily. What changed:   medication strength  how much to take   hydrOXYzine 10 MG tablet Commonly known as: ATARAX/VISTARIL Take 10 mg by mouth 3 (three) times daily as needed for itching or anxiety.   lisinopril 10 MG tablet Commonly known as: ZESTRIL Take 1 tablet (10 mg total) by mouth daily.   metFORMIN 500 MG 24 hr tablet Commonly known as: GLUCOPHAGE-XR Take 500 mg by mouth 2 (two) times daily.   omeprazole 40 MG capsule Commonly known as: PRILOSEC Take 40 mg by mouth daily.   PARoxetine 10 MG tablet Commonly known as: PAXIL Take 10 mg by mouth daily.   potassium chloride 10 MEQ tablet Commonly known as: KLOR-CON Take 10 mEq by mouth daily.   simvastatin 40 MG tablet Commonly known as: ZOCOR Take 40 mg by mouth daily.   tamsulosin 0.4 MG Caps capsule Commonly known as: FLOMAX Take 0.4 mg by mouth daily.   zinc sulfate 220 (50 Zn) MG capsule Take 1 capsule (220 mg total) by mouth daily. Start taking on: April 20, 2020      Allergies  Allergen Reactions  . Eggs Or Egg-Derived Products Nausea And Vomiting  . Novocain [Procaine]   . Sulfa Antibiotics     The results of significant diagnostics from this hospitalization (including imaging, microbiology, ancillary and laboratory) are listed below for reference.    Significant Diagnostic Studies: DG Chest Portable 1 View  Result Date: 04/16/2020 CLINICAL DATA:  Cough and fever. EXAM: PORTABLE CHEST 1 VIEW COMPARISON:  07/09/2018 radiograph and 02/17/2020 CT FINDINGS: The cardiomediastinal silhouette is  unremarkable. Mild RIGHT basilar opacity, question atelectasis versus airspace disease. There is no evidence of focal airspace disease, pulmonary edema, suspicious pulmonary nodule/mass, pleural effusion, or pneumothorax. No acute bony abnormalities are identified. IMPRESSION: Mild RIGHT basilar atelectasis versus airspace disease/pneumonia. Electronically Signed   By: Margarette Canada M.D.   On: 04/16/2020 16:09    Microbiology: Recent Results (from the past 240 hour(s))  SARS Coronavirus 2 by RT PCR (hospital order, performed in [facility] hospital lab) Nasopharyngeal Nasopharyngeal Swab     Status: Abnormal   Collection Time: 04/16/20  3:46 PM   Specimen: Nasopharyngeal Swab  Result Value Ref Range Status   SARS Coronavirus 2 POSITIVE (A) NEGATIVE Final    Comment: RESULT CALLED TO, READ BACK BY AND VERIFIED WITH: GREG MOYER 04/16/20 AT 68 BY AR (NOTE) SARS-CoV-2 target nucleic acids are DETECTED  SARS-CoV-2 RNA is generally detectable in upper respiratory specimens  during the acute phase of infection.  Positive results are indicative  of the presence of the identified virus, but do not rule out bacterial infection or co-infection with other pathogens not detected by the test.  Clinical correlation with patient  history and  other diagnostic information is necessary to determine patient infection status.  The expected result is negative.  Fact Sheet for Patients:   StrictlyIdeas.no   Fact Sheet for Healthcare Providers:   BankingDealers.co.za    This test is not yet approved or cleared by the [place] FDA and  has been authorized for detection and/or diagnosis of SARS-CoV-2 by FDA under an Emergency Use Authorization (EUA).  This EUA will remain in effect (meaning this test  can be used) for the duration of  the COVID-19 declaration under Section 564(b)(1) of the Act, 21 U.S.C. section 360-bbb-3(b)(1), unless the authorization  is terminated or revoked sooner.  Performed at [facility], [address] [place], [place] [address]   Blood culture (routine x 2)     Status: None (Preliminary result)   Collection Time: 04/16/20  4:00 PM   Specimen: BLOOD  Result Value Ref Range Status   Specimen Description BLOOD BLOOD LEFT FOREARM  Final   Special Requests   Final    BOTTLES DRAWN AEROBIC AND ANAEROBIC Blood Culture adequate volume   Culture   Final    NO GROWTH 3 DAYS Performed at [facility], [address] [place], [place] [address]    Report Status PENDING  Incomplete  Blood culture (routine x 2)     Status: None (Preliminary result)   Collection Time: 04/16/20  4:36 PM   Specimen: BLOOD  Result Value Ref Range Status   Specimen Description BLOOD LEFT ANTECUBITAL  Final   Special Requests   Final    BOTTLES DRAWN AEROBIC AND ANAEROBIC Blood Culture adequate volume   Culture   Final    NO GROWTH 3 DAYS Performed at [facility], [address], [place], [place] [address]    Report Status PENDING  Incomplete  Urine culture     Status: Abnormal   Collection Time: 04/16/20  7:43 PM   Specimen: Urine, Clean Catch  Result Value Ref Range Status   Specimen Description   Final    URINE, CLEAN CATCH Performed at [facility], [address] [place], [place] [address]    Special Requests   Final    NONE Performed at [facility], [place], [place] [address]    Culture 10,000 COLONIES/mL VIRIDANS STREPTOCOCCUS (A)  Final   Report Status 04/18/2020 FINAL  Final  MRSA PCR Screening     Status: None   Collection Time: 04/17/20  6:21 PM   Specimen: Nasal Mucosa; Nasopharyngeal  Result Value Ref Range Status   MRSA by PCR NEGATIVE NEGATIVE Final    Comment:        The GeneXpert MRSA Assay (FDA approved for NASAL specimens only), is one component of a comprehensive MRSA colonization surveillance program. It is not intended to diagnose  MRSA infection nor to guide or monitor treatment for MRSA infections. Performed at [facility], [address] [place], [place] [address]      Labs: Basic Metabolic Panel: Recent Labs  Lab 04/16/20 1546 04/17/20 0507 04/18/20 0643 04/19/20 0536  NA 136 135 136 138  K 3.5 3.2* 3.7 3.7  CL 100 101 102 102  CO2 25 25 25 27   GLUCOSE 102* 89 87 80  BUN 13 14 18 21   CREATININE 1.08 1.15 1.25* 1.41*  CALCIUM 9.1 8.7* 8.6* 8.7*  MG  --  1.6* 1.9  --    Liver Function Tests: Recent Labs  Lab 04/16/20 1546  04/18/20 0643 04/19/20 0536  AST 84* 94* 92*  ALT 34 36 36  ALKPHOS 66 51 51  BILITOT 1.4* 1.1 1.0  PROT 7.4 6.2* 6.2*  ALBUMIN 3.0* 2.6* 2.6*   CBC: Recent Labs  Lab 04/16/20 1546 04/17/20 0507 04/18/20 0643 04/19/20 0536  WBC 3.3* 3.2* 2.1* 2.2*  NEUTROABS 1.9  --   --   --   HGB 12.8* 12.0* 11.2* 11.0*  HCT 37.4* 35.9* 34.1* 33.4*  MCV 85.2 86.9 87.4 89.8  PLT 86* 76* 76* 75*    Recent Labs    02/17/20 1149 04/16/20 1546  BNP 118.6* 136.1*     Principal Problem:   COVID-19 virus infection Active Problems:   DM (diabetes mellitus) (HCC)   Thrombocytopenia (HCC)   Elevated transaminase level   Lactic acidosis   Leukopenia   Obesity, Class III, BMI 40-49.9 (morbid obesity) ([place])   Pneumonia due to COVID-19 virus   Time coordinating discharge: 35 minutes  Signed:  Murray Hodgkins, MD  [facility]  04/19/2020, 5:22 PM

## 2020-04-19 NOTE — TOC Transition Note (Signed)
Transition of Care [facility]) - CM/SW Discharge Note   Patient Details  Name: [name] MRN: [id number] Date of Birth: June 17, 1951  Transition of Care [facility]) CM/SW Contact:  Shelbie Hutching, RN Phone Number: 04/19/2020, 11:20 AM   Clinical Narrative:    Patient is medically ready for discharge home.  RNCM has arranged for transport with [facility] for after 4pm this afternoon.  Patient is set up for OP remdesivir infusion at [facility] for tomorrow and Thursday, clinic is arranging transportation for these appointments.     Final next level of care: Home/Self Care Barriers to Discharge: Barriers Resolved   Patient Goals and CMS Choice Patient states their goals for this hospitalization and ongoing recovery are:: Patient wants meals from [facility] set up once he is discharged and he needs a ride home      Discharge Placement                       Discharge Plan and Services   Discharge Planning Services: CM Consult                                 Social Determinants of Health (SDOH) Interventions     Readmission Risk Interventions No flowsheet data found.

## 2020-04-19 NOTE — Progress Notes (Signed)
SATURATION QUALIFICATIONS: (This note is used to comply with regulatory documentation for home oxygen)  Patient Saturations on Room Air at Rest = 99 %  Patient Saturations on Room Air while Ambulating = 98 %   

## 2020-04-19 NOTE — Discharge Instructions (Signed)
Patient scheduled for outpatient Remdesivir infusions at 3pm on Wednesday 8/25 and Thursday 8/26 at [facility]. Please inform the patient to park at [place], as staff will be escorting the patient through the [place] entrance of the hospital.    There is a wave flag banner located near the entrance on [address]. Turn into this entrance and immediately turn left and park in 1 of the 5 designated Covid Infusion Parking spots. There is a phone number on the sign, please call and let the staff know what spot you are in and we will come out and get you. For questions call [phone].  Thanks.

## 2020-04-20 ENCOUNTER — Ambulatory Visit (HOSPITAL_COMMUNITY)
Admit: 2020-04-20 | Discharge: 2020-04-20 | Disposition: A | Discharge status: Home / Self Care | Payer: Medicare HMO | Attending: Pulmonary Disease | Admitting: Pulmonary Disease

## 2020-04-20 DIAGNOSIS — U071 COVID-19: Secondary | ICD-10-CM | POA: Diagnosis present

## 2020-04-20 MED ORDER — DIPHENHYDRAMINE HCL 50 MG/ML IJ SOLN: 50.0000 mg | Freq: Once | INTRAMUSCULAR | Status: DC | PRN

## 2020-04-20 MED ORDER — METHYLPREDNISOLONE SODIUM SUCC 125 MG IJ SOLR: 125.0000 mg | Freq: Once | INTRAMUSCULAR | Status: DC | PRN

## 2020-04-20 MED ORDER — FAMOTIDINE IN NACL 20-0.9 MG/50ML-% IV SOLN: 20.0000 mg | Freq: Once | INTRAVENOUS | Status: DC | PRN

## 2020-04-20 MED ORDER — SODIUM CHLORIDE 0.9 % IV SOLN
100.0000 mg | Freq: Once | INTRAVENOUS | Status: AC
  Administered 2020-04-20: 100 mg via INTRAVENOUS
  Filled 2020-04-20: qty 20

## 2020-04-20 MED ORDER — EPINEPHRINE 0.3 MG/0.3ML IJ SOAJ: 0.3000 mg | Freq: Once | INTRAMUSCULAR | Status: DC | PRN

## 2020-04-20 MED ORDER — SODIUM CHLORIDE 0.9 % IV SOLN: INTRAVENOUS | Status: DC | PRN

## 2020-04-20 MED ORDER — ALBUTEROL SULFATE HFA 108 (90 BASE) MCG/ACT IN AERS: 2.0000 | INHALATION_SPRAY | Freq: Once | RESPIRATORY_TRACT | Status: DC | PRN

## 2020-04-20 NOTE — Progress Notes (Signed)
  Diagnosis: COVID-19  Physician:Dr Asencion Noble    Procedure: Covid Infusion Clinic Med: remdesivir infusion - Provided patient with remdesivir fact sheet for patients, parents and caregivers prior to infusion.  Complications: No immediate complications noted.  Discharge: Discharged home   [name] 04/20/2020

## 2020-04-20 NOTE — Discharge Instructions (Signed)

## 2020-04-21 ENCOUNTER — Ambulatory Visit (HOSPITAL_COMMUNITY)
Admit: 2020-04-21 | Discharge: 2020-04-21 | Disposition: A | Discharge status: Home / Self Care | Payer: Medicare HMO | Attending: Pulmonary Disease | Admitting: Pulmonary Disease

## 2020-04-21 DIAGNOSIS — U071 COVID-19: Secondary | ICD-10-CM | POA: Insufficient documentation

## 2020-04-21 DIAGNOSIS — J1282 Pneumonia due to coronavirus disease 2019: Secondary | ICD-10-CM | POA: Diagnosis not present

## 2020-04-21 LAB — CULTURE, BLOOD (ROUTINE X 2)
Culture: NO GROWTH
Culture: NO GROWTH
Special Requests: ADEQUATE
Special Requests: ADEQUATE

## 2020-04-21 MED ORDER — ALBUTEROL SULFATE HFA 108 (90 BASE) MCG/ACT IN AERS: 2.0000 | INHALATION_SPRAY | Freq: Once | RESPIRATORY_TRACT | Status: DC | PRN

## 2020-04-21 MED ORDER — SODIUM CHLORIDE 0.9 % IV SOLN
100.0000 mg | Freq: Once | INTRAVENOUS | Status: AC
  Administered 2020-04-21: 100 mg via INTRAVENOUS
  Filled 2020-04-21: qty 20

## 2020-04-21 MED ORDER — EPINEPHRINE 0.3 MG/0.3ML IJ SOAJ: 0.3000 mg | Freq: Once | INTRAMUSCULAR | Status: DC | PRN

## 2020-04-21 MED ORDER — DIPHENHYDRAMINE HCL 50 MG/ML IJ SOLN: 50.0000 mg | Freq: Once | INTRAMUSCULAR | Status: DC | PRN

## 2020-04-21 MED ORDER — FAMOTIDINE IN NACL 20-0.9 MG/50ML-% IV SOLN: 20.0000 mg | Freq: Once | INTRAVENOUS | Status: DC | PRN

## 2020-04-21 MED ORDER — SODIUM CHLORIDE 0.9 % IV SOLN: INTRAVENOUS | Status: DC | PRN

## 2020-04-21 MED ORDER — METHYLPREDNISOLONE SODIUM SUCC 125 MG IJ SOLR: 125.0000 mg | Freq: Once | INTRAMUSCULAR | Status: DC | PRN

## 2020-04-21 NOTE — Discharge Instructions (Signed)
10 Things You Can Do to Manage Your COVID-19 Symptoms at Home If you have possible or confirmed COVID-19: 1. Stay home from work and school. And stay away from other public places. If you must go out, avoid using any kind of public transportation, ridesharing, or taxis. 2. Monitor your symptoms carefully. If your symptoms get worse, call your healthcare provider immediately. 3. Get rest and stay hydrated. 4. If you have a medical appointment, call the healthcare provider ahead of time and tell them that you have or may have COVID-19. 5. For medical emergencies, call 911 and notify the dispatch personnel that you have or may have COVID-19. 6. Cover your cough and sneezes with a tissue or use the inside of your elbow. 7. Wash your hands often with soap and water for at least 20 seconds or clean your hands with an alcohol-based hand sanitizer that contains at least 60% alcohol. 8. As much as possible, stay in a specific room and away from other people in your home. Also, you should use a separate bathroom, if available. If you need to be around other people in or outside of the home, wear a mask. 9. Avoid sharing personal items with other people in your household, like dishes, towels, and bedding. 10. Clean all surfaces that are touched often, like counters, tabletops, and doorknobs. Use household cleaning sprays or wipes according to the label instructions. cdc.gov/coronavirus 02/25/2019 This information is not intended to replace advice given to you by your health care provider. Make sure you discuss any questions you have with your health care provider. Document Revised: 07/30/2019 Document Reviewed: 07/30/2019 Elsevier Patient Education  2020 Elsevier Inc.  

## 2020-04-21 NOTE — Progress Notes (Signed)
°  Diagnosis: COVID-19 ° °Physician:Dr Wright  ° °Procedure: Covid Infusion Clinic Med: remdesivir infusion - Provided patient with remdesivir fact sheet for patients, parents and caregivers prior to infusion. ° °Complications: No immediate complications noted. ° °Discharge: Discharged home  ° °Daily Crate W °04/21/2020 ° °

## 2020-07-04 ENCOUNTER — Other Ambulatory Visit: Payer: Self-pay

## 2020-07-04 ENCOUNTER — Encounter: Payer: Self-pay | Admitting: Emergency Medicine

## 2020-07-04 ENCOUNTER — Emergency Department: Payer: Medicare HMO

## 2020-07-04 ENCOUNTER — Emergency Department
Admission: EM | Admit: 2020-07-04 | Discharge: 2020-07-04 | Disposition: A | Discharge status: Home / Self Care | Payer: Medicare HMO | Attending: Emergency Medicine | Admitting: Emergency Medicine

## 2020-07-04 DIAGNOSIS — Z87891 Personal history of nicotine dependence: Secondary | ICD-10-CM | POA: Diagnosis not present

## 2020-07-04 DIAGNOSIS — R0789 Other chest pain: Secondary | ICD-10-CM | POA: Diagnosis not present

## 2020-07-04 DIAGNOSIS — Z7984 Long term (current) use of oral hypoglycemic drugs: Secondary | ICD-10-CM | POA: Diagnosis not present

## 2020-07-04 DIAGNOSIS — Z8616 Personal history of COVID-19: Secondary | ICD-10-CM | POA: Insufficient documentation

## 2020-07-04 DIAGNOSIS — B349 Viral infection, unspecified: Secondary | ICD-10-CM

## 2020-07-04 DIAGNOSIS — J45909 Unspecified asthma, uncomplicated: Secondary | ICD-10-CM | POA: Diagnosis not present

## 2020-07-04 DIAGNOSIS — Z79899 Other long term (current) drug therapy: Secondary | ICD-10-CM | POA: Diagnosis not present

## 2020-07-04 DIAGNOSIS — E119 Type 2 diabetes mellitus without complications: Secondary | ICD-10-CM | POA: Diagnosis not present

## 2020-07-04 DIAGNOSIS — R1110 Vomiting, unspecified: Secondary | ICD-10-CM | POA: Diagnosis present

## 2020-07-04 DIAGNOSIS — I110 Hypertensive heart disease with heart failure: Secondary | ICD-10-CM | POA: Diagnosis not present

## 2020-07-04 DIAGNOSIS — I5032 Chronic diastolic (congestive) heart failure: Secondary | ICD-10-CM | POA: Diagnosis not present

## 2020-07-04 LAB — HEPATIC FUNCTION PANEL
ALT: 37 U/L (ref 0–44)
AST: 58 U/L — ABNORMAL HIGH (ref 15–41)
Albumin: 3.4 g/dL — ABNORMAL LOW (ref 3.5–5.0)
Alkaline Phosphatase: 90 U/L (ref 38–126)
Bilirubin, Direct: 0.3 mg/dL — ABNORMAL HIGH (ref 0.0–0.2)
Indirect Bilirubin: 1.1 mg/dL — ABNORMAL HIGH (ref 0.3–0.9)
Total Bilirubin: 1.4 mg/dL — ABNORMAL HIGH (ref 0.3–1.2)
Total Protein: 8 g/dL (ref 6.5–8.1)

## 2020-07-04 LAB — TROPONIN I (HIGH SENSITIVITY)
Troponin I (High Sensitivity): 19 ng/L — ABNORMAL HIGH (ref <18)
Troponin I (High Sensitivity): 20 ng/L — ABNORMAL HIGH (ref <18)

## 2020-07-04 LAB — CBC
HCT: 41.8 % (ref 39.0–52.0)
Hemoglobin: 13.9 g/dL (ref 13.0–17.0)
MCH: 29.3 pg (ref 26.0–34.0)
MCHC: 33.3 g/dL (ref 30.0–36.0)
MCV: 88 fL (ref 80.0–100.0)
Platelets: 147 10*3/uL — ABNORMAL LOW (ref 150–400)
RBC: 4.75 MIL/uL (ref 4.22–5.81)
RDW: 14.9 % (ref 11.5–15.5)
WBC: 3.8 10*3/uL — ABNORMAL LOW (ref 4.0–10.5)
nRBC: 0 % (ref 0.0–0.2)

## 2020-07-04 LAB — LIPASE, BLOOD: Lipase: 27 U/L (ref 11–51)

## 2020-07-04 LAB — BASIC METABOLIC PANEL
Anion gap: 9 (ref 5–15)
BUN: 14 mg/dL (ref 8–23)
CO2: 31 mmol/L (ref 22–32)
Calcium: 9.2 mg/dL (ref 8.9–10.3)
Chloride: 101 mmol/L (ref 98–111)
Creatinine, Ser: 1.36 mg/dL — ABNORMAL HIGH (ref 0.61–1.24)
GFR, Estimated: 56 mL/min — ABNORMAL LOW (ref >60)
Glucose, Bld: 130 mg/dL — ABNORMAL HIGH (ref 70–99)
Potassium: 3.3 mmol/L — ABNORMAL LOW (ref 3.5–5.1)
Sodium: 141 mmol/L (ref 135–145)

## 2020-07-04 MED ORDER — ALUM & MAG HYDROXIDE-SIMETH 200-200-20 MG/5ML PO SUSP
15.0000 mL | Freq: Once | ORAL | Status: AC
  Administered 2020-07-04: 15 mL via ORAL
  Filled 2020-07-04: qty 30

## 2020-07-04 MED ORDER — LACTATED RINGERS IV BOLUS
500.0000 mL | Freq: Once | INTRAVENOUS | Status: AC
  Administered 2020-07-04: 500 mL via INTRAVENOUS

## 2020-07-04 MED ORDER — CETIRIZINE HCL 10 MG PO TABS: 10.0000 mg | ORAL_TABLET | Freq: Every day | ORAL | 0 refills | Status: DC

## 2020-07-04 MED ORDER — LACTATED RINGERS IV BOLUS: 1000.0000 mL | Freq: Once | INTRAVENOUS | Status: DC

## 2020-07-04 MED ORDER — LIDOCAINE VISCOUS HCL 2 % MT SOLN
15.0000 mL | Freq: Once | OROMUCOSAL | Status: AC
  Administered 2020-07-04: 15 mL via ORAL
  Filled 2020-07-04: qty 15

## 2020-07-04 MED ORDER — ONDANSETRON HCL 4 MG/2ML IJ SOLN
4.0000 mg | Freq: Once | INTRAMUSCULAR | Status: AC
  Administered 2020-07-04: 4 mg via INTRAVENOUS
  Filled 2020-07-04: qty 2

## 2020-07-04 MED ORDER — GUAIFENESIN ER 600 MG PO TB12: 600.0000 mg | ORAL_TABLET | Freq: Two times a day (BID) | ORAL | 0 refills | Status: AC

## 2020-07-04 MED ORDER — POTASSIUM CHLORIDE CRYS ER 20 MEQ PO TBCR
20.0000 meq | EXTENDED_RELEASE_TABLET | Freq: Once | ORAL | Status: AC
  Administered 2020-07-04: 20 meq via ORAL
  Filled 2020-07-04: qty 1

## 2020-07-04 NOTE — ED Provider Notes (Signed)
[facility] Emergency Department Provider Note   ____________________________________________   First MD Initiated Contact with Patient 07/04/20 1651     (approximate)  I have reviewed the triage vital signs and the nursing notes.   HISTORY  Chief Complaint Chest Pain, Cough, and Diarrhea    HPI [name] is a 69 y.o. male with past medical history of hypertension, diabetes, COPD, and CHF who presents to the ED complaining of chest pain and vomiting.  Patient states that he first started having vomiting and diarrhea last week, has been vomiting a couple of times per day since then.  He reports feeling generally weak and has started to develop stabbing pain in the center of his chest along with cough and mild shortness of breath.  Pain in his chest can radiate to his left arm, is present intermittently but not exacerbated by anything in particular.  He has not noticed any blood in his vomit or stool.  He was previously diagnosed with COVID-19 in late August, has been doing well following his admission and discharge at that time.  He denies any recent fevers or sick contacts.        Past Medical History:  Diagnosis Date  . Asthma   . CHF (congestive heart failure) ([place])   . COPD (chronic obstructive pulmonary disease) ([place])   . Diabetes mellitus without complication ([place])   . Homelessness   . Hypertension   . Migraine   . Obesity   . Sleep apnea     Patient Active Problem List   Diagnosis Date Noted  . Pneumonia due to COVID-19 virus 04/19/2020  . Elevated transaminase level 04/18/2020  . Obesity, Class III, BMI 40-49.9 (morbid obesity) ([place]) 04/18/2020  . Lactic acidosis 04/17/2020  . Thrombocytopenia ([place]) 04/17/2020  . Leukopenia 04/17/2020  . COVID-19 virus infection 04/16/2020  . HTN (hypertension) 04/17/2018  . DM (diabetes mellitus) ([place]) 04/17/2018  . Lymphedema 04/17/2018  . Chronic diastolic CHF (congestive heart failure) ([place])  04/07/2018  . Anemia 04/07/2018  . Bilateral leg pain 03/29/2018  . Chest pain 03/20/2018  . Adjustment disorder with mixed anxiety and depressed mood 12/16/2017  . Homelessness 12/16/2017    Past Surgical History:  Procedure Laterality Date  . CARDIAC CATHETERIZATION    . CHOLECYSTECTOMY    . EYE SURGERY    . INNER EAR SURGERY    . NOSE SURGERY      Prior to Admission medications   Medication Sig Start Date End Date Taking? Authorizing Provider  albuterol (PROVENTIL HFA;VENTOLIN HFA) 108 (90 Base) MCG/ACT inhaler Inhale 2 puffs into the lungs every 6 (six) hours as needed for wheezing or shortness of breath. 07/09/18   Rudene Re, MD  ascorbic acid (VITAMIN C) 500 MG tablet Take 1 tablet (500 mg total) by mouth daily. 04/20/20   Samuella Cota, MD  cetirizine (ZYRTEC ALLERGY) 10 MG tablet Take 1 tablet (10 mg total) by mouth daily. 07/04/20 08/03/20  Blake Divine, MD  cyclobenzaprine (FLEXERIL) 5 MG tablet Take 5 mg by mouth at bedtime as needed for muscle spasms.     [provider]  furosemide (LASIX) 40 MG tablet Take 1 tablet (40 mg total) by mouth daily. 04/19/20   Samuella Cota, MD  guaiFENesin (MUCINEX) 600 MG 12 hr tablet Take 1 tablet (600 mg total) by mouth 2 (two) times daily. 07/04/20 08/03/20  Blake Divine, MD  hydrOXYzine (ATARAX/VISTARIL) 10 MG tablet Take 10 mg by mouth 3 (three) times  daily as needed for itching or anxiety.    [provider]  lisinopril (PRINIVIL,ZESTRIL) 10 MG tablet Take 1 tablet (10 mg total) by mouth daily. 04/16/18   Alisa Graff, FNP  metFORMIN (GLUCOPHAGE-XR) 500 MG 24 hr tablet Take 500 mg by mouth 2 (two) times daily.     [provider]  omeprazole (PRILOSEC) 40 MG capsule Take 40 mg by mouth daily.    [provider]  PARoxetine (PAXIL) 10 MG tablet Take 10 mg by mouth daily.     [provider]  potassium chloride (K-DUR) 10 MEQ tablet Take 10 mEq by mouth daily.     [provider]  simvastatin (ZOCOR) 40 MG tablet Take 40 mg by mouth daily.    [provider]  tamsulosin (FLOMAX) 0.4 MG CAPS capsule Take 0.4 mg by mouth daily.    [provider]  zinc sulfate 220 (50 Zn) MG capsule Take 1 capsule (220 mg total) by mouth daily. 04/20/20   Samuella Cota, MD    Allergies Eggs or egg-derived products, Novocain [procaine], and Sulfa antibiotics  Family History  Problem Relation Age of Onset  . Hypertension Mother   . Heart disease Mother   . Heart failure Maternal Grandmother     Social History Social History   Tobacco Use  . Smoking status: Former Research scientist (life sciences)  . Smokeless tobacco: Never Used  Vaping Use  . Vaping Use: Never assessed  Substance Use Topics  . Alcohol use: Never  . Drug use: Never    Review of Systems  Constitutional: No fever/chills.  Positive for generalized weakness. Eyes: No visual changes. ENT: No sore throat. Cardiovascular: Positive for chest pain. Respiratory: Positive for cough and shortness of breath. Gastrointestinal: No abdominal pain.  Positive for nausea, vomiting, and diarrhea.  No constipation. Genitourinary: Negative for dysuria. Musculoskeletal: Negative for back pain. Skin: Negative for rash. Neurological: Negative for headaches, focal weakness or numbness.  ____________________________________________   PHYSICAL EXAM:  VITAL SIGNS: ED Triage Vitals  Enc Vitals Group     BP 07/04/20 1217 (!) 156/85     Pulse Rate 07/04/20 1217 (!) 55     Resp 07/04/20 1217 18     Temp 07/04/20 1217 98.6 F (37 C)     Temp Source 07/04/20 1217 Oral     SpO2 07/04/20 1217 97 %     Weight 07/04/20 1218 250 lb (113.4 kg)     Height --      Head Circumference --      Peak Flow --      Pain Score 07/04/20 1217 4     Pain Loc --      Pain Edu? --      Excl. in [place]? --     Constitutional: Alert and oriented. Eyes: Conjunctivae are normal. Head: Atraumatic. Nose: No  congestion/rhinnorhea. Mouth/Throat: Mucous membranes are moist. Neck: Normal ROM Cardiovascular: Normal rate, regular rhythm. Grossly normal heart sounds.  2+ radial pulses bilaterally. Respiratory: Normal respiratory effort.  No retractions. Lungs CTAB. Gastrointestinal: Soft and nontender. No distention. Genitourinary: deferred Musculoskeletal: Trace pitting edema to mid shins bilaterally, no tenderness noted. Neurologic:  Normal speech and language. No gross focal neurologic deficits are appreciated. Skin:  Skin is warm, dry and intact. No rash noted. Psychiatric: Mood and affect are normal. Speech and behavior are normal.  ____________________________________________   LABS (all labs ordered are listed, but only abnormal results are displayed)  Labs Reviewed  BASIC METABOLIC PANEL -  Abnormal; Notable for the following components:      Result Value   Potassium 3.3 (*)    Glucose, Bld 130 (*)    Creatinine, Ser 1.36 (*)    GFR, Estimated 56 (*)    All other components within normal limits  CBC - Abnormal; Notable for the following components:   WBC 3.8 (*)    Platelets 147 (*)    All other components within normal limits  HEPATIC FUNCTION PANEL - Abnormal; Notable for the following components:   Albumin 3.4 (*)    AST 58 (*)    Total Bilirubin 1.4 (*)    Bilirubin, Direct 0.3 (*)    Indirect Bilirubin 1.1 (*)    All other components within normal limits  TROPONIN I (HIGH SENSITIVITY) - Abnormal; Notable for the following components:   Troponin I (High Sensitivity) 19 (*)    All other components within normal limits  TROPONIN I (HIGH SENSITIVITY) - Abnormal; Notable for the following components:   Troponin I (High Sensitivity) 20 (*)    All other components within normal limits  LIPASE, BLOOD   ____________________________________________  EKG  ED ECG REPORT I, Blake Divine, the attending physician, personally viewed and interpreted this ECG.   Date:  07/04/2020  EKG Time: 12:20  Rate: 56  Rhythm: normal sinus rhythm  Axis: Normal  Intervals:none  ST&T Change: None   PROCEDURES  Procedure(s) performed (including Critical Care):  Procedures   ____________________________________________   INITIAL IMPRESSION / ASSESSMENT AND PLAN / ED COURSE       69 year old male with past medical history of hypertension, diabetes, COPD, and CHF who presents to the ED complaining of a few days of nausea and vomiting followed by 2 days of intermittent sharp pain in the center of his chest along with cough and mild difficulty breathing.  He is not in any respiratory distress on my assessment, lungs clear to auscultation bilaterally.  EKG shows no evidence of arrhythmia or ischemia, 2 sets of troponin are mildly elevated but stable.  Symptoms are atypical for ACS and more likely due to some esophagitis with his recent vomiting.  I have a low suspicion for ACS at this time, also doubt PE or dissection.  Chest x-ray reviewed by me and shows no infiltrate, edema, or effusion.  We will hydrate with IV fluids, treat with IV Zofran and GI cocktail.  I also have a low suspicion for COVID-19 given he has a positive test within the past 90 days.  Patient likely with other viral illness causing gastroenteritis.  Patient reports improvement in symptoms following Zofran and GI cocktail.  Patient appropriate for discharge home with PCP follow-up, he is primarily complaining of upper airway congestion at this time and we will prescribe Mucinex and cetirizine.  He was counseled to return to the ED for new or worsening symptoms, patient agrees with plan.      ____________________________________________   FINAL CLINICAL IMPRESSION(S) / ED DIAGNOSES  Final diagnoses:  Atypical chest pain  Viral syndrome     ED Discharge Orders         Ordered    guaiFENesin (MUCINEX) 600 MG 12 hr tablet  2 times daily        07/04/20 2056    cetirizine (ZYRTEC ALLERGY) 10  MG tablet  Daily        07/04/20 2056           Note:  This document was prepared using Dragon voice recognition software and  may include unintentional dictation errors.   Blake Divine, MD 07/04/20 [phone]

## 2020-07-04 NOTE — ED Triage Notes (Signed)
Pt in w/multiple complaints. Reports diarrhea x 1 wk, feels generalized weakness. States he has been having intermittent cp x 2 days, L sided and radiates to L arm. Reports fever and cough x few days, states temp 101 PTA. Had Covid in late August, sats 98% on RA in triage.

## 2020-07-04 NOTE — ED Notes (Signed)
Charna Archer, MD at bedside.

## 2020-09-19 ENCOUNTER — Emergency Department
Admission: EM | Admit: 2020-09-19 | Discharge: 2020-09-19 | Disposition: A | Discharge status: Home / Self Care | Payer: Medicare HMO | Attending: Emergency Medicine | Admitting: Emergency Medicine

## 2020-09-19 ENCOUNTER — Emergency Department: Payer: Medicare HMO

## 2020-09-19 ENCOUNTER — Encounter: Payer: Self-pay | Admitting: Emergency Medicine

## 2020-09-19 DIAGNOSIS — R079 Chest pain, unspecified: Secondary | ICD-10-CM | POA: Diagnosis not present

## 2020-09-19 DIAGNOSIS — Z7984 Long term (current) use of oral hypoglycemic drugs: Secondary | ICD-10-CM | POA: Diagnosis not present

## 2020-09-19 DIAGNOSIS — R0602 Shortness of breath: Secondary | ICD-10-CM | POA: Diagnosis not present

## 2020-09-19 DIAGNOSIS — E119 Type 2 diabetes mellitus without complications: Secondary | ICD-10-CM | POA: Diagnosis not present

## 2020-09-19 DIAGNOSIS — I5032 Chronic diastolic (congestive) heart failure: Secondary | ICD-10-CM | POA: Insufficient documentation

## 2020-09-19 DIAGNOSIS — Z8616 Personal history of COVID-19: Secondary | ICD-10-CM | POA: Diagnosis not present

## 2020-09-19 DIAGNOSIS — I110 Hypertensive heart disease with heart failure: Secondary | ICD-10-CM | POA: Diagnosis not present

## 2020-09-19 DIAGNOSIS — J45909 Unspecified asthma, uncomplicated: Secondary | ICD-10-CM | POA: Insufficient documentation

## 2020-09-19 DIAGNOSIS — R059 Cough, unspecified: Secondary | ICD-10-CM | POA: Diagnosis not present

## 2020-09-19 DIAGNOSIS — Z79899 Other long term (current) drug therapy: Secondary | ICD-10-CM | POA: Insufficient documentation

## 2020-09-19 DIAGNOSIS — J449 Chronic obstructive pulmonary disease, unspecified: Secondary | ICD-10-CM | POA: Diagnosis not present

## 2020-09-19 DIAGNOSIS — Z87891 Personal history of nicotine dependence: Secondary | ICD-10-CM | POA: Insufficient documentation

## 2020-09-19 DIAGNOSIS — R112 Nausea with vomiting, unspecified: Secondary | ICD-10-CM | POA: Insufficient documentation

## 2020-09-19 DIAGNOSIS — R197 Diarrhea, unspecified: Secondary | ICD-10-CM | POA: Diagnosis not present

## 2020-09-19 LAB — CBC
HCT: 38.4 % — ABNORMAL LOW (ref 39.0–52.0)
Hemoglobin: 12.6 g/dL — ABNORMAL LOW (ref 13.0–17.0)
MCH: 29.6 pg (ref 26.0–34.0)
MCHC: 32.8 g/dL (ref 30.0–36.0)
MCV: 90.4 fL (ref 80.0–100.0)
Platelets: 116 10*3/uL — ABNORMAL LOW (ref 150–400)
RBC: 4.25 MIL/uL (ref 4.22–5.81)
RDW: 15.2 % (ref 11.5–15.5)
WBC: 3.9 10*3/uL — ABNORMAL LOW (ref 4.0–10.5)
nRBC: 0 % (ref 0.0–0.2)

## 2020-09-19 LAB — BASIC METABOLIC PANEL
Anion gap: 11 (ref 5–15)
BUN: 15 mg/dL (ref 8–23)
CO2: 27 mmol/L (ref 22–32)
Calcium: 9.7 mg/dL (ref 8.9–10.3)
Chloride: 107 mmol/L (ref 98–111)
Creatinine, Ser: 1.1 mg/dL (ref 0.61–1.24)
GFR, Estimated: >60 mL/min (ref >60)
Glucose, Bld: 144 mg/dL — ABNORMAL HIGH (ref 70–99)
Potassium: 3.5 mmol/L (ref 3.5–5.1)
Sodium: 145 mmol/L (ref 135–145)

## 2020-09-19 LAB — TROPONIN I (HIGH SENSITIVITY)
Troponin I (High Sensitivity): 26 ng/L — ABNORMAL HIGH (ref <18)
Troponin I (High Sensitivity): 26 ng/L — ABNORMAL HIGH (ref <18)

## 2020-09-19 MED ORDER — ONDANSETRON HCL 4 MG PO TABS: 4.0000 mg | ORAL_TABLET | Freq: Every day | ORAL | 1 refills | Status: DC | PRN

## 2020-09-19 MED ORDER — ONDANSETRON 4 MG PO TBDP
4.0000 mg | ORAL_TABLET | Freq: Once | ORAL | Status: AC
  Administered 2020-09-19: 4 mg via ORAL
  Filled 2020-09-19: qty 1

## 2020-09-19 MED ORDER — LOPERAMIDE HCL 2 MG PO CAPS
4.0000 mg | ORAL_CAPSULE | Freq: Once | ORAL | Status: AC
  Administered 2020-09-19: 4 mg via ORAL
  Filled 2020-09-19: qty 2

## 2020-09-19 NOTE — ED Provider Notes (Signed)
[facility] Emergency Department Provider Note  ____________________________________________   Event Date/Time   First MD Initiated Contact with Patient 09/19/20 [phone]     (approximate)  I have reviewed the triage vital signs and the nursing notes.   HISTORY  Chief Complaint Shortness of Breath    HPI [name] is a 70 y.o. male with medical history as listed below who presents for evaluation of 1 month of shortness of breath, cough, intermittent chest pain, nausea, vomiting, and diarrhea.  He is frustrated about all of the symptoms but the diarrhea seems to be the thing bothering him the most.  He says he goes to the bathroom multiple times a day and it is hard for him to sleep because he has to have bowel movements.  He denies abdominal pain.  He has not had any chest pain or now.  He said he feels kind of like he did when he had COVID-19 last year.  He has had no recent fever, sore throat, or changes in smell or taste.  He describes chest pain is intermittent and sharp and mild.  He is not having trouble breathing.  Nothing in particular makes the symptoms better or worse.         Past Medical History:  Diagnosis Date  . Asthma   . CHF (congestive heart failure) ([place])   . COPD (chronic obstructive pulmonary disease) ([place])   . COVID-19 03/2020   diagnosed in August 2021  . Diabetes mellitus without complication ([place])   . Homelessness   . Hypertension   . Migraine   . Obesity   . Sleep apnea     Patient Active Problem List   Diagnosis Date Noted  . Pneumonia due to COVID-19 virus 04/19/2020  . Elevated transaminase level 04/18/2020  . Obesity, Class III, BMI 40-49.9 (morbid obesity) ([place]) 04/18/2020  . Lactic acidosis 04/17/2020  . Thrombocytopenia ([place]) 04/17/2020  . Leukopenia 04/17/2020  . COVID-19 virus infection 04/16/2020  . HTN (hypertension) 04/17/2018  . DM (diabetes mellitus) ([place]) 04/17/2018  . Lymphedema 04/17/2018  .  Chronic diastolic CHF (congestive heart failure) ([place]) 04/07/2018  . Anemia 04/07/2018  . Bilateral leg pain 03/29/2018  . Chest pain 03/20/2018  . Adjustment disorder with mixed anxiety and depressed mood 12/16/2017  . Homelessness 12/16/2017    Past Surgical History:  Procedure Laterality Date  . CARDIAC CATHETERIZATION    . CHOLECYSTECTOMY    . EYE SURGERY    . INNER EAR SURGERY    . NOSE SURGERY      Prior to Admission medications   Medication Sig Start Date End Date Taking? Authorizing Provider  ondansetron (ZOFRAN) 4 MG tablet Take 1 tablet (4 mg total) by mouth daily as needed for nausea or vomiting. 09/19/20 09/19/21 Yes Hinda Kehr, MD  albuterol (PROVENTIL HFA;VENTOLIN HFA) 108 (90 Base) MCG/ACT inhaler Inhale 2 puffs into the lungs every 6 (six) hours as needed for wheezing or shortness of breath. 07/09/18   Rudene Re, MD  ascorbic acid (VITAMIN C) 500 MG tablet Take 1 tablet (500 mg total) by mouth daily. 04/20/20   Samuella Cota, MD  cetirizine (ZYRTEC ALLERGY) 10 MG tablet Take 1 tablet (10 mg total) by mouth daily. 07/04/20 08/03/20  Blake Divine, MD  cyclobenzaprine (FLEXERIL) 5 MG tablet Take 5 mg by mouth at bedtime as needed for muscle spasms.     [provider]  furosemide (LASIX) 40 MG tablet Take 1 tablet (40 mg total)  by mouth daily. 04/19/20   Samuella Cota, MD  hydrOXYzine (ATARAX/VISTARIL) 10 MG tablet Take 10 mg by mouth 3 (three) times daily as needed for itching or anxiety.    [provider]  lisinopril (PRINIVIL,ZESTRIL) 10 MG tablet Take 1 tablet (10 mg total) by mouth daily. 04/16/18   Alisa Graff, FNP  metFORMIN (GLUCOPHAGE-XR) 500 MG 24 hr tablet Take 500 mg by mouth 2 (two) times daily.     [provider]  omeprazole (PRILOSEC) 40 MG capsule Take 40 mg by mouth daily.    [provider]  PARoxetine (PAXIL) 10 MG tablet Take 10 mg by mouth daily.     [provider]  potassium chloride  (K-DUR) 10 MEQ tablet Take 10 mEq by mouth daily.     [provider]  simvastatin (ZOCOR) 40 MG tablet Take 40 mg by mouth daily.    [provider]  tamsulosin (FLOMAX) 0.4 MG CAPS capsule Take 0.4 mg by mouth daily.    [provider]  zinc sulfate 220 (50 Zn) MG capsule Take 1 capsule (220 mg total) by mouth daily. 04/20/20   Samuella Cota, MD    Allergies Eggs or egg-derived products, Novocain [procaine], and Sulfa antibiotics  Family History  Problem Relation Age of Onset  . Hypertension Mother   . Heart disease Mother   . Heart failure Maternal Grandmother     Social History Social History   Tobacco Use  . Smoking status: Former Research scientist (life sciences)  . Smokeless tobacco: Never Used  Substance Use Topics  . Alcohol use: Never  . Drug use: Never    Review of Systems Constitutional: No fever/chills Eyes: No visual changes. ENT: No sore throat. Cardiovascular: Intermittent chest pain for at least a month Respiratory: Shortness of breath for least a month Gastrointestinal: No abdominal pain.  Positive for nausea, vomiting, and diarrhea for least a month. Genitourinary: Negative for dysuria. Musculoskeletal: Negative for neck pain.  Negative for back pain. Integumentary: Negative for rash. Neurological: Negative for headaches, focal weakness or numbness.   ____________________________________________   PHYSICAL EXAM:  VITAL SIGNS: ED Triage Vitals  Enc Vitals Group     BP 09/19/20 0123 (!) 159/69     Pulse Rate 09/19/20 0123 60     Resp 09/19/20 0123 20     Temp 09/19/20 0123 99.5 F (37.5 C)     Temp Source 09/19/20 0123 Oral     SpO2 09/19/20 0116 96 %     Weight --      Height --      Head Circumference --      Peak Flow --      Pain Score --      Pain Loc --      Pain Edu? --      Excl. in [place]? --     Constitutional: Alert and oriented.  Eyes: Conjunctivae are normal.  Head: Atraumatic. Nose: No  congestion/rhinnorhea. Mouth/Throat: Patient is wearing a mask. Neck: No stridor.  No meningeal signs.   Cardiovascular: Normal rate and occasionally mildly bradycardic, regular rhythm. Good peripheral circulation. Respiratory: Normal respiratory effort.  No retractions.  Lungs are clear to auscultation bilaterally.  No cough observed during my assessment. Gastrointestinal: Morbid obesity.  Soft and nontender. No distention.  Musculoskeletal: No lower extremity tenderness nor edema. No gross deformities of extremities. Neurologic:  Normal speech and language. No gross focal neurologic deficits are appreciated.  Skin:  Skin is warm, dry and intact.  Psychiatric: Mood and affect are normal. Speech and behavior are normal.  ____________________________________________   LABS (all labs ordered are listed, but only abnormal results are displayed)  Labs Reviewed  BASIC METABOLIC PANEL - Abnormal; Notable for the following components:      Result Value   Glucose, Bld 144 (*)    All other components within normal limits  CBC - Abnormal; Notable for the following components:   WBC 3.9 (*)    Hemoglobin 12.6 (*)    HCT 38.4 (*)    Platelets 116 (*)    All other components within normal limits  TROPONIN I (HIGH SENSITIVITY) - Abnormal; Notable for the following components:   Troponin I (High Sensitivity) 26 (*)    All other components within normal limits  TROPONIN I (HIGH SENSITIVITY) - Abnormal; Notable for the following components:   Troponin I (High Sensitivity) 26 (*)    All other components within normal limits   ____________________________________________  EKG  ED ECG REPORT I, Hinda Kehr, the attending physician, personally viewed and interpreted this ECG.  Date: 09/19/2020 EKG Time: 1:20 AM Rate: 58 Rhythm: Borderline sinus bradycardia QRS Axis: normal Intervals: normal ST/T Wave abnormalities: normal Narrative Interpretation: no evidence of acute  ischemia  ____________________________________________  RADIOLOGY I, Hinda Kehr, personally viewed and evaluated these images (plain radiographs) as part of my medical decision making, as well as reviewing the written report by the radiologist.  ED MD interpretation: No acute abnormality identified on chest x-ray  Official radiology report(s): DG Chest 2 View  Result Date: 09/19/2020 CLINICAL DATA:  Shortness of breath. EXAM: CHEST - 2 VIEW COMPARISON:  July 04, 2020 FINDINGS: The heart size and mediastinal contours are within normal limits. Both lungs are clear. The visualized skeletal structures are unremarkable. IMPRESSION: No active cardiopulmonary disease. Electronically Signed   By: Constance Holster M.D.   On: 09/19/2020 01:51    ____________________________________________   PROCEDURES   Procedure(s) performed (including Critical Care):  Procedures   ____________________________________________   INITIAL IMPRESSION / MDM / [place] / ED COURSE  As part of my medical decision making, I reviewed the following data within the [place] notes reviewed and incorporated, Labs reviewed , EKG interpreted , Old chart reviewed, Radiograph reviewed  and Notes from prior ED visits   Differential diagnosis includes, but is not limited to, chronic bronchitis, viral syndrome, community-acquired pneumonia, COVID-19 reinfection, nonspecific viral gastroenteritis, acute intra-abdominal infection, anxiety, homelessness.  Despite the patient's chronic medical conditions and complaints, he is generally well-appearing.  Vital signs are normal, afebrile, no tachycardia, no tachypnea, no hypoxemia.  I personally reviewed the patient's imaging and agree with the radiologist's interpretation that there is no evidence of acute abnormality on chest x-ray.  His lungs are clear to auscultation.  He has no tenderness to palpation of the abdomen.  He is  tolerating oral intake.  I explained to them that his lab results are very reassuring as well.  His basic metabolic panel is within normal limits and his CBC is normal other than a very mild leukopenia at 3.9.  His high-sensitivity troponin is 26 but this is consistent with very mild elevation he has had in the past.  Given that he is not having chest pain at this time, has no ischemia on EKG, and has had elevated troponins in the past, I do not suspect that this represents ACS.  A second troponin is pending to make sure that it is not trending  up, but I explained to the patient that I anticipate discharge and outpatient follow-up.  He requested something for his nausea and diarrhea and I ordered Zofran and loperamide as listed below.  I will provide follow-up information for him but at this point there is no evidence of an acute medical condition requiring further treatment or admission.  I considered obtaining another swab for COVID-19 but COVID is of little utility given his well appearance, normal vitals, and clear chest x-ray tonight and the fact that he already had it last year.  Although reinfection is possible, his symptoms appear mild and I do not think he would benefit from additional testing at this time.       Clinical Course as of 09/19/20 0550  Mon Sep 19, 2020  3419 Troponin I (High Sensitivity)(!): 26 Stable and unchanged troponin.  We will plan for discharge as previously described. [CF]    Clinical Course User Index [CF] Hinda Kehr, MD     ____________________________________________  FINAL CLINICAL IMPRESSION(S) / ED DIAGNOSES  Final diagnoses:  Chronic shortness of breath  Diarrhea, unspecified type     MEDICATIONS GIVEN DURING THIS VISIT:  Medications  loperamide (IMODIUM) capsule 4 mg (has no administration in time range)  ondansetron (ZOFRAN-ODT) disintegrating tablet 4 mg (has no administration in time range)     ED Discharge Orders         Ordered     ondansetron (ZOFRAN) 4 MG tablet  Daily PRN        09/19/20 0544          *Please note:  [name] was evaluated in Emergency Department on 09/19/2020 for the symptoms described in the history of present illness. He was evaluated in the context of the global COVID-19 pandemic, which necessitated consideration that the patient might be at risk for infection with the SARS-CoV-2 virus that causes COVID-19. Institutional protocols and algorithms that pertain to the evaluation of patients at risk for COVID-19 are in a state of rapid change based on information released by regulatory bodies including the CDC and federal and state organizations. These policies and algorithms were followed during the patient's care in the ED.  Some ED evaluations and interventions may be delayed as a result of limited staffing during and after the pandemic.*  Note:  This document was prepared using Dragon voice recognition software and may include unintentional dictation errors.   Hinda Kehr, MD 09/19/20 [phone]

## 2020-09-19 NOTE — ED Triage Notes (Signed)
Pt arrived via EMS from home with c/o cough, fever, central chest pain with inhale x1 month, worse tonight with SOB.   CBG 148 O2 96% RA

## 2020-09-19 NOTE — ED Notes (Signed)
Pt given a diet cola with ice.

## 2020-09-19 NOTE — Discharge Instructions (Signed)
Your workup in the Emergency Department today was reassuring.  We did not find any specific abnormalities.  We recommend you drink plenty of fluids, take your regular medications and/or any new ones prescribed today, and follow up with the doctor(s) listed in these documents as recommended.  Return to the Emergency Department if you develop new or worsening symptoms that concern you.  

## 2020-10-06 ENCOUNTER — Telehealth: Payer: Self-pay | Admitting: Cardiovascular Disease

## 2020-10-06 NOTE — Telephone Encounter (Signed)
3 attempts to schedule fu appt from recall list.   Deleting recall.   

## 2020-11-16 ENCOUNTER — Encounter (HOSPITAL_COMMUNITY): Payer: Self-pay

## 2020-12-15 ENCOUNTER — Ambulatory Visit
Admission: RE | Admit: 2020-12-15 | Discharge: 2020-12-15 | Disposition: A | Discharge status: Home / Self Care | Payer: 59 | Source: Ambulatory Visit | Attending: Family Medicine | Admitting: Family Medicine

## 2020-12-15 ENCOUNTER — Other Ambulatory Visit: Payer: Self-pay | Admitting: Family Medicine

## 2020-12-15 ENCOUNTER — Ambulatory Visit
Admission: RE | Admit: 2020-12-15 | Discharge: 2020-12-15 | Disposition: A | Discharge status: Home / Self Care | Payer: 59 | Attending: Family Medicine | Admitting: Family Medicine

## 2020-12-15 DIAGNOSIS — M25572 Pain in left ankle and joints of left foot: Secondary | ICD-10-CM

## 2020-12-15 DIAGNOSIS — M25571 Pain in right ankle and joints of right foot: Secondary | ICD-10-CM

## 2020-12-15 DIAGNOSIS — M25561 Pain in right knee: Secondary | ICD-10-CM

## 2020-12-15 DIAGNOSIS — M25562 Pain in left knee: Secondary | ICD-10-CM | POA: Diagnosis present

## 2020-12-27 ENCOUNTER — Other Ambulatory Visit: Payer: Self-pay

## 2020-12-27 ENCOUNTER — Encounter: Payer: Self-pay | Admitting: Emergency Medicine

## 2020-12-27 DIAGNOSIS — Z7984 Long term (current) use of oral hypoglycemic drugs: Secondary | ICD-10-CM | POA: Insufficient documentation

## 2020-12-27 DIAGNOSIS — E119 Type 2 diabetes mellitus without complications: Secondary | ICD-10-CM | POA: Diagnosis not present

## 2020-12-27 DIAGNOSIS — Z87891 Personal history of nicotine dependence: Secondary | ICD-10-CM | POA: Diagnosis not present

## 2020-12-27 DIAGNOSIS — S0240DA Maxillary fracture, left side, initial encounter for closed fracture: Secondary | ICD-10-CM | POA: Insufficient documentation

## 2020-12-27 DIAGNOSIS — S0232XA Fracture of orbital floor, left side, initial encounter for closed fracture: Secondary | ICD-10-CM | POA: Insufficient documentation

## 2020-12-27 DIAGNOSIS — I110 Hypertensive heart disease with heart failure: Secondary | ICD-10-CM | POA: Diagnosis not present

## 2020-12-27 DIAGNOSIS — Z79899 Other long term (current) drug therapy: Secondary | ICD-10-CM | POA: Diagnosis not present

## 2020-12-27 DIAGNOSIS — J449 Chronic obstructive pulmonary disease, unspecified: Secondary | ICD-10-CM | POA: Diagnosis not present

## 2020-12-27 DIAGNOSIS — S0990XA Unspecified injury of head, initial encounter: Secondary | ICD-10-CM | POA: Diagnosis present

## 2020-12-27 DIAGNOSIS — J45909 Unspecified asthma, uncomplicated: Secondary | ICD-10-CM | POA: Insufficient documentation

## 2020-12-27 DIAGNOSIS — I5032 Chronic diastolic (congestive) heart failure: Secondary | ICD-10-CM | POA: Diagnosis not present

## 2020-12-27 DIAGNOSIS — Z8616 Personal history of COVID-19: Secondary | ICD-10-CM | POA: Diagnosis not present
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
## 2020-12-28 ENCOUNTER — Emergency Department
Admission: EM | Admit: 2020-12-28 | Discharge: 2020-12-28 | Disposition: A | Discharge status: Home / Self Care | Payer: 59 | Attending: Emergency Medicine | Admitting: Emergency Medicine

## 2020-12-28 ENCOUNTER — Emergency Department: Payer: 59

## 2020-12-28 DIAGNOSIS — S0990XA Unspecified injury of head, initial encounter: Secondary | ICD-10-CM

## 2020-12-28 DIAGNOSIS — S0240DA Maxillary fracture, left side, initial encounter for closed fracture: Secondary | ICD-10-CM | POA: Diagnosis not present

## 2020-12-28 DIAGNOSIS — S0232XA Fracture of orbital floor, left side, initial encounter for closed fracture: Secondary | ICD-10-CM

## 2020-12-28 MED ORDER — OXYCODONE-ACETAMINOPHEN 5-325 MG PO TABS: 1.0000 | ORAL_TABLET | Freq: Four times a day (QID) | ORAL | 0 refills | Status: DC | PRN

## 2020-12-28 MED ORDER — ONDANSETRON 4 MG PO TBDP: 4.0000 mg | ORAL_TABLET | Freq: Four times a day (QID) | ORAL | 0 refills | Status: DC | PRN

## 2020-12-28 MED ORDER — ACETAMINOPHEN 500 MG PO TABS
1000.0000 mg | ORAL_TABLET | Freq: Once | ORAL | Status: AC
  Administered 2020-12-28: 1000 mg via ORAL
  Filled 2020-12-28: qty 2

## 2020-12-28 MED ORDER — OXYCODONE HCL 5 MG PO TABS
5.0000 mg | ORAL_TABLET | Freq: Once | ORAL | Status: AC
  Administered 2020-12-28: 5 mg via ORAL
  Filled 2020-12-28: qty 1

## 2020-12-28 MED ORDER — ONDANSETRON 4 MG PO TBDP
4.0000 mg | ORAL_TABLET | Freq: Once | ORAL | Status: AC
  Administered 2020-12-28: 4 mg via ORAL
  Filled 2020-12-28: qty 1
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
## 2020-12-28 NOTE — Discharge Instructions (Signed)
You are being provided a prescription for opiates (also known as narcotics) for pain control.  Opiates can be addictive and should only be used when absolutely necessary for pain control when other alternatives do not work.  We recommend you only use them for the recommended amount of time and only as prescribed.  Please do not take with other sedative medications or alcohol.  Please do not drive, operate machinery, make important decisions while taking opiates.  Please note that these medications can be addictive and have high abuse potential.  Patients can become addicted to narcotics after only taking them for a few days.  Please keep these medications locked away from children, teenagers or any family members with history of substance abuse.  Narcotic pain medicine may also make you constipated.  You may use over-the-counter medications such as MiraLAX, Colace to prevent constipation.  If you become constipated you may use over-the-counter enemas as needed.  Itching and nausea are common side effects of narcotic pain medication.  If you develop uncontrolled vomiting or a rash, please stop these medications.   Please call ENT, Dr. Tami Ribas, to schedule outpatient follow-up for your left-sided facial fractures.  You may apply ice to the side of your face to help with pain, swelling, bruising.

## 2020-12-30 ENCOUNTER — Other Ambulatory Visit: Payer: Self-pay | Admitting: Internal Medicine

## 2020-12-30 ENCOUNTER — Encounter: Payer: Self-pay | Admitting: Family

## 2020-12-30 ENCOUNTER — Ambulatory Visit: Payer: 59 | Attending: Family | Admitting: Family

## 2020-12-30 ENCOUNTER — Other Ambulatory Visit: Payer: Self-pay

## 2020-12-30 VITALS — BP 175/86 | HR 60 | Resp 18 | Ht 66.0 in | Wt 304.0 lb

## 2020-12-30 DIAGNOSIS — R519 Headache, unspecified: Secondary | ICD-10-CM | POA: Diagnosis present

## 2020-12-30 DIAGNOSIS — Z8249 Family history of ischemic heart disease and other diseases of the circulatory system: Secondary | ICD-10-CM | POA: Insufficient documentation

## 2020-12-30 DIAGNOSIS — Z7984 Long term (current) use of oral hypoglycemic drugs: Secondary | ICD-10-CM | POA: Insufficient documentation

## 2020-12-30 DIAGNOSIS — Z87891 Personal history of nicotine dependence: Secondary | ICD-10-CM | POA: Insufficient documentation

## 2020-12-30 DIAGNOSIS — Y929 Unspecified place or not applicable: Secondary | ICD-10-CM | POA: Diagnosis not present

## 2020-12-30 DIAGNOSIS — Z8616 Personal history of COVID-19: Secondary | ICD-10-CM | POA: Insufficient documentation

## 2020-12-30 DIAGNOSIS — R42 Dizziness and giddiness: Secondary | ICD-10-CM | POA: Diagnosis not present

## 2020-12-30 DIAGNOSIS — Z79899 Other long term (current) drug therapy: Secondary | ICD-10-CM | POA: Diagnosis not present

## 2020-12-30 DIAGNOSIS — S0232XD Fracture of orbital floor, left side, subsequent encounter for fracture with routine healing: Secondary | ICD-10-CM | POA: Insufficient documentation

## 2020-12-30 DIAGNOSIS — G8929 Other chronic pain: Secondary | ICD-10-CM | POA: Insufficient documentation

## 2020-12-30 DIAGNOSIS — I110 Hypertensive heart disease with heart failure: Secondary | ICD-10-CM | POA: Insufficient documentation

## 2020-12-30 DIAGNOSIS — R0789 Other chest pain: Secondary | ICD-10-CM | POA: Diagnosis not present

## 2020-12-30 DIAGNOSIS — I5032 Chronic diastolic (congestive) heart failure: Secondary | ICD-10-CM | POA: Diagnosis not present

## 2020-12-30 DIAGNOSIS — Z7902 Long term (current) use of antithrombotics/antiplatelets: Secondary | ICD-10-CM | POA: Insufficient documentation

## 2020-12-30 DIAGNOSIS — J449 Chronic obstructive pulmonary disease, unspecified: Secondary | ICD-10-CM | POA: Diagnosis not present

## 2020-12-30 DIAGNOSIS — Z791 Long term (current) use of non-steroidal anti-inflammatories (NSAID): Secondary | ICD-10-CM | POA: Diagnosis not present

## 2020-12-30 DIAGNOSIS — Y939 Activity, unspecified: Secondary | ICD-10-CM | POA: Insufficient documentation

## 2020-12-30 DIAGNOSIS — R059 Cough, unspecified: Secondary | ICD-10-CM | POA: Diagnosis not present

## 2020-12-30 DIAGNOSIS — I10 Essential (primary) hypertension: Secondary | ICD-10-CM

## 2020-12-30 DIAGNOSIS — H538 Other visual disturbances: Secondary | ICD-10-CM | POA: Diagnosis not present

## 2020-12-30 DIAGNOSIS — E119 Type 2 diabetes mellitus without complications: Secondary | ICD-10-CM | POA: Diagnosis not present

## 2020-12-30 DIAGNOSIS — Z7901 Long term (current) use of anticoagulants: Secondary | ICD-10-CM | POA: Insufficient documentation
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
## 2020-12-30 NOTE — Patient Instructions (Addendum)
Resume weighing daily and call for an overnight weight gain of > 2 pounds or a weekly weight gain of >5 pounds.   [facility] Dr. Tami Ribas  01/05/21 at 130pm; arrive at 1pm  [address]  [place], [place] [address] [phone]
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
## 2021-01-05 ENCOUNTER — Ambulatory Visit: Payer: 59 | Admitting: Gastroenterology

## 2021-01-12 ENCOUNTER — Encounter: Payer: Self-pay | Admitting: *Deleted

## 2021-01-12 ENCOUNTER — Ambulatory Visit: Payer: 59 | Admitting: Gastroenterology
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
## 2021-01-25 ENCOUNTER — Emergency Department: Payer: 59

## 2021-01-25 ENCOUNTER — Other Ambulatory Visit: Payer: Self-pay

## 2021-01-25 DIAGNOSIS — I110 Hypertensive heart disease with heart failure: Secondary | ICD-10-CM | POA: Insufficient documentation

## 2021-01-25 DIAGNOSIS — W1842XA Slipping, tripping and stumbling without falling due to stepping into hole or opening, initial encounter: Secondary | ICD-10-CM | POA: Diagnosis not present

## 2021-01-25 DIAGNOSIS — Y92512 Supermarket, store or market as the place of occurrence of the external cause: Secondary | ICD-10-CM | POA: Insufficient documentation

## 2021-01-25 DIAGNOSIS — J45909 Unspecified asthma, uncomplicated: Secondary | ICD-10-CM | POA: Insufficient documentation

## 2021-01-25 DIAGNOSIS — I5032 Chronic diastolic (congestive) heart failure: Secondary | ICD-10-CM | POA: Diagnosis not present

## 2021-01-25 DIAGNOSIS — E119 Type 2 diabetes mellitus without complications: Secondary | ICD-10-CM | POA: Diagnosis not present

## 2021-01-25 DIAGNOSIS — S50312A Abrasion of left elbow, initial encounter: Secondary | ICD-10-CM | POA: Diagnosis not present

## 2021-01-25 DIAGNOSIS — S8992XA Unspecified injury of left lower leg, initial encounter: Secondary | ICD-10-CM | POA: Diagnosis present

## 2021-01-25 DIAGNOSIS — S60511A Abrasion of right hand, initial encounter: Secondary | ICD-10-CM | POA: Insufficient documentation

## 2021-01-25 DIAGNOSIS — S80212A Abrasion, left knee, initial encounter: Secondary | ICD-10-CM | POA: Diagnosis not present

## 2021-01-25 DIAGNOSIS — S60512A Abrasion of left hand, initial encounter: Secondary | ICD-10-CM | POA: Insufficient documentation

## 2021-01-25 DIAGNOSIS — Z8616 Personal history of COVID-19: Secondary | ICD-10-CM | POA: Insufficient documentation

## 2021-01-25 DIAGNOSIS — M25572 Pain in left ankle and joints of left foot: Secondary | ICD-10-CM | POA: Insufficient documentation

## 2021-01-25 DIAGNOSIS — Z87891 Personal history of nicotine dependence: Secondary | ICD-10-CM | POA: Diagnosis not present

## 2021-01-25 DIAGNOSIS — M79672 Pain in left foot: Secondary | ICD-10-CM | POA: Diagnosis not present

## 2021-01-25 DIAGNOSIS — Z79899 Other long term (current) drug therapy: Secondary | ICD-10-CM | POA: Insufficient documentation

## 2021-01-25 DIAGNOSIS — J449 Chronic obstructive pulmonary disease, unspecified: Secondary | ICD-10-CM | POA: Insufficient documentation

## 2021-01-25 DIAGNOSIS — Z7984 Long term (current) use of oral hypoglycemic drugs: Secondary | ICD-10-CM | POA: Insufficient documentation
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
## 2021-01-26 ENCOUNTER — Emergency Department: Payer: 59

## 2021-01-26 ENCOUNTER — Emergency Department
Admission: EM | Admit: 2021-01-26 | Discharge: 2021-01-26 | Disposition: A | Discharge status: Home / Self Care | Payer: 59 | Attending: Emergency Medicine | Admitting: Emergency Medicine

## 2021-01-26 DIAGNOSIS — W19XXXA Unspecified fall, initial encounter: Secondary | ICD-10-CM

## 2021-01-26 DIAGNOSIS — S80212A Abrasion, left knee, initial encounter: Secondary | ICD-10-CM | POA: Diagnosis not present

## 2021-01-26 DIAGNOSIS — T07XXXA Unspecified multiple injuries, initial encounter: Secondary | ICD-10-CM

## 2021-01-26 LAB — BASIC METABOLIC PANEL
Anion gap: 10 (ref 5–15)
BUN: 16 mg/dL (ref 8–23)
CO2: 24 mmol/L (ref 22–32)
Calcium: 9.6 mg/dL (ref 8.9–10.3)
Chloride: 106 mmol/L (ref 98–111)
Creatinine, Ser: 1.24 mg/dL (ref 0.61–1.24)
GFR, Estimated: >60 mL/min (ref >60)
Glucose, Bld: 130 mg/dL — ABNORMAL HIGH (ref 70–99)
Potassium: 3.5 mmol/L (ref 3.5–5.1)
Sodium: 140 mmol/L (ref 135–145)

## 2021-01-26 LAB — CBC WITH DIFFERENTIAL/PLATELET
Abs Immature Granulocytes: 0.03 10*3/uL (ref 0.00–0.07)
Basophils Absolute: 0.1 10*3/uL (ref 0.0–0.1)
Basophils Relative: 1 %
Eosinophils Absolute: 0.2 10*3/uL (ref 0.0–0.5)
Eosinophils Relative: 3 %
HCT: 45.8 % (ref 39.0–52.0)
Hemoglobin: 14.9 g/dL (ref 13.0–17.0)
Immature Granulocytes: 0 %
Lymphocytes Relative: 32 %
Lymphs Abs: 2.3 10*3/uL (ref 0.7–4.0)
MCH: 30.9 pg (ref 26.0–34.0)
MCHC: 32.5 g/dL (ref 30.0–36.0)
MCV: 95 fL (ref 80.0–100.0)
Monocytes Absolute: 1 10*3/uL (ref 0.1–1.0)
Monocytes Relative: 14 %
Neutro Abs: 3.7 10*3/uL (ref 1.7–7.7)
Neutrophils Relative %: 50 %
Platelets: 122 10*3/uL — ABNORMAL LOW (ref 150–400)
RBC: 4.82 MIL/uL (ref 4.22–5.81)
RDW: 14.7 % (ref 11.5–15.5)
WBC: 7.4 10*3/uL (ref 4.0–10.5)
nRBC: 0 % (ref 0.0–0.2)

## 2021-01-26 LAB — MAGNESIUM: Magnesium: 1.9 mg/dL (ref 1.7–2.4)

## 2021-01-26 MED ORDER — ONDANSETRON 4 MG PO TBDP
4.0000 mg | ORAL_TABLET | Freq: Once | ORAL | Status: AC
  Administered 2021-01-26: 4 mg via ORAL
  Filled 2021-01-26: qty 1

## 2021-01-26 MED ORDER — HYDROCODONE-ACETAMINOPHEN 5-325 MG PO TABS: 1.0000 | ORAL_TABLET | Freq: Four times a day (QID) | ORAL | 0 refills | Status: DC | PRN

## 2021-01-26 MED ORDER — OXYCODONE-ACETAMINOPHEN 5-325 MG PO TABS: 1.0000 | ORAL_TABLET | Freq: Once | ORAL | Status: DC

## 2021-01-26 MED ORDER — OXYCODONE-ACETAMINOPHEN 5-325 MG PO TABS
1.0000 | ORAL_TABLET | Freq: Once | ORAL | Status: AC
  Administered 2021-01-26: 1 via ORAL
  Filled 2021-01-26: qty 1

## 2021-01-26 NOTE — ED Provider Notes (Signed)
[facility] Emergency Department Provider Note ____________________________________________   Event Date/Time   First MD Initiated Contact with Patient 01/26/21 0117     (approximate)  I have reviewed the triage vital signs and the nursing notes.   HISTORY  Chief Complaint Fall    HPI [name] is a 70 y.o. male with history of COPD, CHF, hypertension, diabetes who presents to the emergency department after he fell just prior to arrival.  States he was leaving [employer] and stepped off a curb into a hole.  Reports that he fell onto his left leg and caught himself with both arms.  Has multiple abrasions.  States his tetanus vaccination was actually updated yesterday.  He denies hitting his head or losing consciousness.  Complaining of left elbow pain, left knee pain all the way down, lower back pain.         Past Medical History:  Diagnosis Date  . Asthma   . CHF (congestive heart failure) ([place])   . COPD (chronic obstructive pulmonary disease) ([place])   . COVID-19 03/2020   diagnosed in August 2021  . Diabetes mellitus without complication ([place])   . Homelessness   . Hypertension   . Migraine   . Obesity   . Sleep apnea     Patient Active Problem List   Diagnosis Date Noted  . Pneumonia due to COVID-19 virus 04/19/2020  . Elevated transaminase level 04/18/2020  . Obesity, Class III, BMI 40-49.9 (morbid obesity) ([place]) 04/18/2020  . Lactic acidosis 04/17/2020  . Thrombocytopenia ([place]) 04/17/2020  . Leukopenia 04/17/2020  . COVID-19 virus infection 04/16/2020  . HTN (hypertension) 04/17/2018  . DM (diabetes mellitus) ([place]) 04/17/2018  . Lymphedema 04/17/2018  . Chronic diastolic CHF (congestive heart failure) ([place]) 04/07/2018  . Anemia 04/07/2018  . Bilateral leg pain 03/29/2018  . Chest pain 03/20/2018  . Adjustment disorder with mixed anxiety and depressed mood 12/16/2017  . Homelessness 12/16/2017    Past Surgical History:   Procedure Laterality Date  . CARDIAC CATHETERIZATION    . CHOLECYSTECTOMY    . EYE SURGERY    . INNER EAR SURGERY    . NOSE SURGERY      Prior to Admission medications   Medication Sig Start Date End Date Taking? Authorizing Provider  HYDROcodone-acetaminophen (NORCO/VICODIN) 5-325 MG tablet Take 1 tablet by mouth every 6 (six) hours as needed. 01/26/21  Yes Kesha Hurrell, Delice Bison, DO  acetaminophen (TYLENOL) 325 MG tablet Take 650 mg by mouth every 6 (six) hours as needed.    [provider]  albuterol (ACCUNEB) 0.63 MG/3ML nebulizer solution Take 1 ampule by nebulization every 6 (six) hours as needed for wheezing.    [provider]  albuterol (PROVENTIL HFA;VENTOLIN HFA) 108 (90 Base) MCG/ACT inhaler Inhale 2 puffs into the lungs every 6 (six) hours as needed for wheezing or shortness of breath. 07/09/18   Rudene Re, MD  ascorbic acid (VITAMIN C) 500 MG tablet Take 1 tablet (500 mg total) by mouth daily. 04/20/20   Samuella Cota, MD  cetirizine (ZYRTEC ALLERGY) 10 MG tablet Take 1 tablet (10 mg total) by mouth daily. 07/04/20 08/03/20  Blake Divine, MD  cyclobenzaprine (FLEXERIL) 5 MG tablet Take 5 mg by mouth at bedtime as needed for muscle spasms.     [provider]  ferrous sulfate 325 (65 FE) MG tablet Take 325 mg by mouth daily with breakfast.    [provider]  furosemide (LASIX) 40 MG tablet Take  1 tablet (40 mg total) by mouth daily. 04/19/20   Samuella Cota, MD  glucosamine-chondroitin 500-400 MG tablet Take 1 tablet by mouth 3 (three) times daily.    [provider]  hydrOXYzine (ATARAX/VISTARIL) 10 MG tablet Take 10 mg by mouth 3 (three) times daily as needed for itching or anxiety.    [provider]  lisinopril (PRINIVIL,ZESTRIL) 10 MG tablet Take 1 tablet (10 mg total) by mouth daily. 04/16/18   Alisa Graff, FNP  meloxicam (MOBIC) 15 MG tablet Take 15 mg by mouth daily.    [provider]  metFORMIN  (GLUCOPHAGE-XR) 500 MG 24 hr tablet Take 500 mg by mouth daily with breakfast.    [provider]  Multiple Vitamin (MULTIVITAMIN WITH MINERALS) TABS tablet Take 1 tablet by mouth daily.    [provider]  omeprazole (PRILOSEC) 40 MG capsule Take 40 mg by mouth daily.    [provider]  ondansetron (ZOFRAN ODT) 4 MG disintegrating tablet Take 1 tablet (4 mg total) by mouth every 6 (six) hours as needed for nausea or vomiting. 12/28/20   Karey Stucki, Delice Bison, DO  ondansetron (ZOFRAN) 4 MG tablet Take 1 tablet (4 mg total) by mouth daily as needed for nausea or vomiting. Patient not taking: Reported on 12/30/2020 09/19/20 09/19/21  Hinda Kehr, MD  oxyCODONE-acetaminophen (PERCOCET) 5-325 MG tablet Take 1 tablet by mouth every 6 (six) hours as needed for severe pain. 12/28/20   Blair Lundeen, Delice Bison, DO  PARoxetine (PAXIL) 10 MG tablet Take 10 mg by mouth daily.     [provider]  potassium chloride (K-DUR) 10 MEQ tablet Take 10 mEq by mouth daily.     [provider]  simvastatin (ZOCOR) 40 MG tablet Take 40 mg by mouth daily.    [provider]  tamsulosin (FLOMAX) 0.4 MG CAPS capsule Take 0.4 mg by mouth daily.    [provider]  zinc sulfate 220 (50 Zn) MG capsule Take 1 capsule (220 mg total) by mouth daily. Patient not taking: Reported on 12/30/2020 04/20/20   Samuella Cota, MD    Allergies Novocain [procaine]  Family History  Problem Relation Age of Onset  . Hypertension Mother   . Heart disease Mother   . Heart failure Maternal Grandmother     Social History Social History   Tobacco Use  . Smoking status: Former Research scientist (life sciences)  . Smokeless tobacco: Never Used  Substance Use Topics  . Alcohol use: Never  . Drug use: Never    Review of Systems Constitutional: No fever. Eyes: No visual changes. ENT: No sore throat. Cardiovascular: Denies chest pain. Respiratory: Denies shortness of breath. Gastrointestinal: No nausea, vomiting,  diarrhea. Genitourinary: Negative for dysuria. Musculoskeletal: Negative for back pain. Skin: Negative for rash. Neurological: Negative for focal weakness or numbness.   ____________________________________________   PHYSICAL EXAM:  VITAL SIGNS: ED Triage Vitals  Enc Vitals Group     BP 01/25/21 2253 132/77     Pulse Rate 01/25/21 2253 72     Resp 01/25/21 2253 20     Temp 01/25/21 2251 98.7 F (37.1 C)     Temp Source 01/25/21 2251 Oral     SpO2 01/25/21 2253 99 %     Weight 01/25/21 2252 (!) 304 lb (137.9 kg)     Height 01/25/21 2252 5\' 6"  (1.676 m)     Head Circumference --      Peak Flow --      Pain Score  01/25/21 2251 9     Pain Loc --      Pain Edu? --      Excl. in [place]? --    CONSTITUTIONAL: Alert and oriented and responds appropriately to questions.  Disheveled, elderly, obese HEAD: Normocephalic; atraumatic EYES: Conjunctivae clear, PERRL, EOMI ENT: normal nose; no rhinorrhea; moist mucous membranes; pharynx without lesions noted; no dental injury; no septal hematoma NECK: Supple, no meningismus, no LAD; no midline spinal tenderness, step-off or deformity; trachea midline CARD: RRR; S1 and S2 appreciated; no murmurs, no clicks, no rubs, no gallops RESP: Normal chest excursion without splinting or tachypnea; breath sounds clear and equal bilaterally; no wheezes, no rhonchi, no rales; no hypoxia or respiratory distress CHEST:  chest wall stable, no crepitus or ecchymosis or deformity, nontender to palpation; no flail chest ABD/GI: Normal bowel sounds; non-distended; soft, non-tender, no rebound, no guarding; no ecchymosis or other lesions noted PELVIS:  stable, nontender to palpation BACK:  The back appears normal and is non-tender to palpation, there is no CVA tenderness; tender to palpation over the lower lumbar spine without step-off or deformity EXT: Tender to palpation of the left elbow, left knee, left ankle and left dorsal foot.  2+ radial and DP pulses  bilaterally.  Associated scattered abrasions noted to the left elbow and knee.  Compartments soft.  No calf tenderness or calf swelling.  Normal sensation diffusely.  Normal capillary refill.  Small abrasions noted to the palms of both hands but no tenderness over the hands, wrists. SKIN: Normal color for age and race; warm NEURO: Moves all extremities equally PSYCH: The patient's mood and manner are appropriate. Grooming and personal hygiene are appropriate.  ____________________________________________   LABS (all labs ordered are listed, but only abnormal results are displayed)  Labs Reviewed  CBC WITH DIFFERENTIAL/PLATELET - Abnormal; Notable for the following components:      Result Value   Platelets 122 (*)    All other components within normal limits  BASIC METABOLIC PANEL - Abnormal; Notable for the following components:   Glucose, Bld 130 (*)    All other components within normal limits  MAGNESIUM   ____________________________________________  EKG   ____________________________________________  RADIOLOGY I, Takia Runyon, personally viewed and evaluated these images (plain radiographs) as part of my medical decision making, as well as reviewing the written report by the radiologist.  ED MD interpretation: X-ray showed no acute abnormality.  Official radiology report(s): DG Lumbar Spine Complete  Result Date: 01/26/2021 CLINICAL DATA:  Fall with low back pain EXAM: LUMBAR SPINE - COMPLETE 4+ VIEW COMPARISON:  08/01/2018 FINDINGS: Five non rib-bearing lumbar type vertebra. Sagittal alignment within normal limits. Vertebral body heights are maintained. Mild disc space narrowing at L3-L4. Minimal degenerative osteophytes. Facet degenerative changes of the lower lumbar spine. Small right kidney stone IMPRESSION: 1. No acute osseous abnormality. 2. Small right kidney stone Electronically Signed   By: Donavan Foil M.D.   On: 01/26/2021 02:20   DG Elbow Complete Left  Result  Date: 01/26/2021 CLINICAL DATA:  Fall with elbow pain EXAM: LEFT ELBOW - COMPLETE 3+ VIEW COMPARISON:  None. FINDINGS: There is no evidence of fracture, dislocation, or joint effusion. There is no evidence of arthropathy or other focal bone abnormality. Soft tissues are unremarkable. IMPRESSION: Negative. Electronically Signed   By: Donavan Foil M.D.   On: 01/26/2021 02:21   DG Ankle Complete Left  Result Date: 01/25/2021 CLINICAL DATA:  Stepped in hole.  Pain from left knee to left  foot. EXAM: LEFT ANKLE COMPLETE - 3+ VIEW COMPARISON:  None. FINDINGS: No acute bony abnormality. Specifically, no fracture, subluxation, or dislocation. Soft tissues intact. Joint spaces maintained. IMPRESSION: No acute bony abnormality. Electronically Signed   By: Rolm Baptise M.D.   On: 01/25/2021 23:19   DG Knee Complete 4 Views Left  Result Date: 01/25/2021 CLINICAL DATA:  Stepped in hole, knee pain EXAM: LEFT KNEE - COMPLETE 4+ VIEW COMPARISON:  None. FINDINGS: Joint space narrowing and spurring in the medial compartment. No acute bony abnormality. Specifically, no fracture, subluxation, or dislocation. No joint effusion. IMPRESSION: No acute bony abnormality. Electronically Signed   By: Rolm Baptise M.D.   On: 01/25/2021 23:19   DG Foot Complete Left  Result Date: 01/25/2021 CLINICAL DATA:  Stepped in hole.  Pain from left knee to left foot. EXAM: LEFT FOOT - COMPLETE 3+ VIEW COMPARISON:  None. FINDINGS: Posterior and plantar calcaneal spurs. No acute bony abnormality. Specifically, no fracture, subluxation, or dislocation. Soft tissues are intact. IMPRESSION: No acute bony abnormality. Electronically Signed   By: Rolm Baptise M.D.   On: 01/25/2021 23:19    ____________________________________________   PROCEDURES  Procedure(s) performed (including Critical Care):  Procedures   ____________________________________________   INITIAL IMPRESSION / ASSESSMENT AND PLAN / ED COURSE  As part of my medical  decision making, I reviewed the following data within the [place] notes reviewed and incorporated, Labs reviewed , Old chart reviewed, Radiograph reviewed  Notes from prior ED visits and [place] Controlled Substance Database         Patient here with mechanical fall.  X-rays of the left lower extremity initially obtained which were unremarkable.  We will add on x-rays of the left elbow and lumbar spine.  Will give pain medication here.  He reports his tetanus vaccination is up-to-date.  Denies any head injury.  ED PROGRESS  Patient's x-ray showed no acute abnormality.  He reports no improvement with oxycodone and states he is not able to bear weight.  We will repeat pain medication.  He declines any assistance with a cane or walker stating that these cause him to become "tripped up" and that he will fall using assistive devices.  He also reports he is now having cramping in all of his extremities and is on Lasix 80 mg daily and is worried about his electrolytes.  We will check basic labs today.  He denies any chest pain, shortness of breath.   3:48 AM  Pt reports significant improvement in pain after Ace bandage placed and he is able to ambulate without difficulty.  This was prior to even receiving a second dose of pain medication.  Will hold on second dose of pain medicine at this time given he appears to be feeling better.   4:09 AM  Pt's labs appear to be at his baseline.  No significant electrolyte derangement.  Will discharge home with brief course of pain medication.  He has outpatient PCP follow-up as needed.  At this time, I do not feel there is any life-threatening condition present. I have reviewed, interpreted and discussed all results (EKG, imaging, lab, urine as appropriate) and exam findings with patient/family. I have reviewed nursing notes and appropriate previous records.  I feel the patient is safe to be discharged home without further emergent workup and  can continue workup as an outpatient as needed. Discussed usual and customary return precautions. Patient/family verbalize understanding and are comfortable with this plan.  Outpatient follow-up  has been provided as needed. All questions have been answered.  ____________________________________________   FINAL CLINICAL IMPRESSION(S) / ED DIAGNOSES  Final diagnoses:  Fall, initial encounter  Multiple abrasions     ED Discharge Orders         Ordered    HYDROcodone-acetaminophen (NORCO/VICODIN) 5-325 MG tablet  Every 6 hours PRN        01/26/21 0411          *Please note:  [name] was evaluated in Emergency Department on 01/26/2021 for the symptoms described in the history of present illness. He was evaluated in the context of the global COVID-19 pandemic, which necessitated consideration that the patient might be at risk for infection with the SARS-CoV-2 virus that causes COVID-19. Institutional protocols and algorithms that pertain to the evaluation of patients at risk for COVID-19 are in a state of rapid change based on information released by regulatory bodies including the CDC and federal and state organizations. These policies and algorithms were followed during the patient's care in the ED.  Some ED evaluations and interventions may be delayed as a result of limited staffing during and the pandemic.*   Note:  This document was prepared using Dragon voice recognition software and may include unintentional dictation errors.   Siddarth Hsiung, Delice Bison, DO 01/26/21 [phone]

## 2021-01-26 NOTE — ED Notes (Signed)
Pt up to ambulate and is reporting to writer and MD at bedside that he cant and he will just crawl. Pt refusing walker because he sts that he will get his feet tied up into the walker and fall. Pt requesting to have "something" stiff to support his leg in a straight formation. Pt educated by MD on the risk that would cause for DVT and that that would not be a viable option. Pt voicing concern for DVT post fall due to hard place felt behind leg as well as issues with muscle cramping.

## 2021-01-26 NOTE — Discharge Instructions (Addendum)
You are being provided a prescription for opiates (also known as narcotics) for pain control.  Opiates can be addictive and should only be used when absolutely necessary for pain control when other alternatives do not work.  We recommend you only use them for the recommended amount of time and only as prescribed.  Please do not take with other sedative medications or alcohol.  Please do not drive, operate machinery, make important decisions while taking opiates.  Please note that these medications can be addictive and have high abuse potential.  Patients can become addicted to narcotics after only taking them for a few days.  Please keep these medications locked away from children, teenagers or any family members with history of substance abuse.  Narcotic pain medicine may also make you constipated.  You may use over-the-counter medications such as MiraLAX, Colace to prevent constipation.  If you become constipated you may use over-the-counter enemas as needed.  Itching and nausea are common side effects of narcotic pain medication.  If you develop uncontrolled vomiting or a rash, please stop these medications.  

## 2021-01-26 NOTE — ED Notes (Signed)
Pt discharged home with instructions reviewed.

## 2021-02-01 ENCOUNTER — Ambulatory Visit: Payer: 59 | Admitting: Family

## 2021-02-01 ENCOUNTER — Telehealth: Payer: Self-pay | Admitting: Family

## 2021-02-01 ENCOUNTER — Ambulatory Visit: Admission: RE | Admit: 2021-02-01 | Payer: 59 | Source: Ambulatory Visit
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
## 2021-02-01 NOTE — Telephone Encounter (Signed)
Patient did not show for his [facility] appointment on 02/01/21. Will attempt to reschedule.

## 2021-03-02 ENCOUNTER — Emergency Department
Admission: EM | Admit: 2021-03-02 | Discharge: 2021-03-02 | Disposition: A | Discharge status: Home / Self Care | Payer: 59 | Attending: Emergency Medicine | Admitting: Emergency Medicine

## 2021-03-02 ENCOUNTER — Emergency Department: Payer: 59

## 2021-03-02 DIAGNOSIS — Z8616 Personal history of COVID-19: Secondary | ICD-10-CM | POA: Diagnosis not present

## 2021-03-02 DIAGNOSIS — E119 Type 2 diabetes mellitus without complications: Secondary | ICD-10-CM | POA: Insufficient documentation

## 2021-03-02 DIAGNOSIS — R059 Cough, unspecified: Secondary | ICD-10-CM | POA: Diagnosis not present

## 2021-03-02 DIAGNOSIS — R1110 Vomiting, unspecified: Secondary | ICD-10-CM | POA: Insufficient documentation

## 2021-03-02 DIAGNOSIS — Z20822 Contact with and (suspected) exposure to covid-19: Secondary | ICD-10-CM | POA: Diagnosis not present

## 2021-03-02 DIAGNOSIS — R0789 Other chest pain: Secondary | ICD-10-CM

## 2021-03-02 DIAGNOSIS — Z87891 Personal history of nicotine dependence: Secondary | ICD-10-CM | POA: Insufficient documentation

## 2021-03-02 DIAGNOSIS — Z79899 Other long term (current) drug therapy: Secondary | ICD-10-CM | POA: Insufficient documentation

## 2021-03-02 DIAGNOSIS — I5032 Chronic diastolic (congestive) heart failure: Secondary | ICD-10-CM | POA: Diagnosis not present

## 2021-03-02 DIAGNOSIS — R0602 Shortness of breath: Secondary | ICD-10-CM | POA: Insufficient documentation

## 2021-03-02 DIAGNOSIS — J45909 Unspecified asthma, uncomplicated: Secondary | ICD-10-CM | POA: Insufficient documentation

## 2021-03-02 DIAGNOSIS — J449 Chronic obstructive pulmonary disease, unspecified: Secondary | ICD-10-CM | POA: Diagnosis not present

## 2021-03-02 DIAGNOSIS — I110 Hypertensive heart disease with heart failure: Secondary | ICD-10-CM | POA: Insufficient documentation

## 2021-03-02 DIAGNOSIS — Z7984 Long term (current) use of oral hypoglycemic drugs: Secondary | ICD-10-CM | POA: Diagnosis not present

## 2021-03-02 DIAGNOSIS — R072 Precordial pain: Secondary | ICD-10-CM | POA: Insufficient documentation

## 2021-03-02 LAB — CBC
HCT: 42.7 % (ref 39.0–52.0)
Hemoglobin: 14.6 g/dL (ref 13.0–17.0)
MCH: 30.9 pg (ref 26.0–34.0)
MCHC: 34.2 g/dL (ref 30.0–36.0)
MCV: 90.3 fL (ref 80.0–100.0)
Platelets: 112 10*3/uL — ABNORMAL LOW (ref 150–400)
RBC: 4.73 MIL/uL (ref 4.22–5.81)
RDW: 14 % (ref 11.5–15.5)
WBC: 4.2 10*3/uL (ref 4.0–10.5)
nRBC: 0 % (ref 0.0–0.2)

## 2021-03-02 LAB — RESP PANEL BY RT-PCR (FLU A&B, COVID) ARPGX2
Influenza A by PCR: NEGATIVE
Influenza B by PCR: NEGATIVE
SARS Coronavirus 2 by RT PCR: NEGATIVE

## 2021-03-02 LAB — BASIC METABOLIC PANEL
Anion gap: 7 (ref 5–15)
BUN: 13 mg/dL (ref 8–23)
CO2: 27 mmol/L (ref 22–32)
Calcium: 9.3 mg/dL (ref 8.9–10.3)
Chloride: 105 mmol/L (ref 98–111)
Creatinine, Ser: 1.14 mg/dL (ref 0.61–1.24)
GFR, Estimated: >60 mL/min (ref >60)
Glucose, Bld: 143 mg/dL — ABNORMAL HIGH (ref 70–99)
Potassium: 2.9 mmol/L — ABNORMAL LOW (ref 3.5–5.1)
Sodium: 139 mmol/L (ref 135–145)

## 2021-03-02 LAB — BRAIN NATRIURETIC PEPTIDE: B Natriuretic Peptide: 112.1 pg/mL — ABNORMAL HIGH (ref 0.0–100.0)

## 2021-03-02 LAB — TROPONIN I (HIGH SENSITIVITY)
Troponin I (High Sensitivity): 31 ng/L — ABNORMAL HIGH (ref <18)
Troponin I (High Sensitivity): 32 ng/L — ABNORMAL HIGH (ref <18)

## 2021-03-02 LAB — LIPASE, BLOOD: Lipase: 29 U/L (ref 11–51)

## 2021-03-02 MED ORDER — POTASSIUM CHLORIDE CRYS ER 20 MEQ PO TBCR
40.0000 meq | EXTENDED_RELEASE_TABLET | Freq: Once | ORAL | Status: AC
  Administered 2021-03-02: 40 meq via ORAL
  Filled 2021-03-02: qty 2

## 2021-03-02 MED ORDER — ACETAMINOPHEN 500 MG PO TABS
1000.0000 mg | ORAL_TABLET | Freq: Once | ORAL | Status: AC
  Administered 2021-03-02: 1000 mg via ORAL
  Filled 2021-03-02: qty 2

## 2021-03-02 MED ORDER — ASPIRIN 81 MG PO CHEW: 324.0000 mg | CHEWABLE_TABLET | Freq: Once | ORAL | Status: DC

## 2021-03-02 MED ORDER — ALUM & MAG HYDROXIDE-SIMETH 200-200-20 MG/5ML PO SUSP
30.0000 mL | Freq: Once | ORAL | Status: AC
  Administered 2021-03-02: 30 mL via ORAL
  Filled 2021-03-02: qty 30

## 2021-03-02 MED ORDER — LIDOCAINE VISCOUS HCL 2 % MT SOLN
15.0000 mL | Freq: Once | OROMUCOSAL | Status: AC
  Administered 2021-03-02: 15 mL via ORAL
  Filled 2021-03-02: qty 15

## 2021-03-02 MED ORDER — FUROSEMIDE 10 MG/ML IJ SOLN
40.0000 mg | Freq: Once | INTRAMUSCULAR | Status: AC
  Administered 2021-03-02: 40 mg via INTRAVENOUS
  Filled 2021-03-02: qty 4

## 2021-03-02 NOTE — ED Provider Notes (Signed)
[facility] Emergency Department Provider Note ____________________________________________   Event Date/Time   First MD Initiated Contact with Patient 03/02/21 0301     (approximate)  I have reviewed the triage vital signs and the nursing notes.  HISTORY  Chief Complaint Chest Pain   HPI [name] is a 70 y.o. malewho presents to the ED for evaluation of chest pain.  Chart review indicates history of obesity, OSA, HTN, DM and CHF.  Patient presents to the ED via EMS from home for evaluation of of shortness of breath, cough, orthopnea and emesis.  He reports feeling fine and "normal" throughout the day yesterday (7/6) but awoke at about 1 AM this morning with the symptoms.  He reports waking up choking, coughing and subsequently having emesis of nonbloody nonbilious emesis.  Reports tightness and pain to his lower substernal chest since that time.  Denies any syncopal episodes, abdominal pain, fevers, recent illnesses.  Reports compliance with his Lasix.   Past Medical History:  Diagnosis Date   Asthma    CHF (congestive heart failure) ([place])    COPD (chronic obstructive pulmonary disease) ([place])    COVID-19 03/2020   diagnosed in August 2021   Diabetes mellitus without complication ([place])    Homelessness    Hypertension    Migraine    Obesity    Sleep apnea     Patient Active Problem List   Diagnosis Date Noted   Pneumonia due to COVID-19 virus 04/19/2020   Elevated transaminase level 04/18/2020   Obesity, Class III, BMI 40-49.9 (morbid obesity) ([place]) 04/18/2020   Lactic acidosis 04/17/2020   Thrombocytopenia ([place]) 04/17/2020   Leukopenia 04/17/2020   COVID-19 virus infection 04/16/2020   HTN (hypertension) 04/17/2018   DM (diabetes mellitus) ([place]) 04/17/2018   Lymphedema 04/17/2018   Chronic diastolic CHF (congestive heart failure) ([place]) 04/07/2018   Anemia 04/07/2018   Bilateral leg pain 03/29/2018   Chest pain 03/20/2018    Adjustment disorder with mixed anxiety and depressed mood 12/16/2017   Homelessness 12/16/2017    Past Surgical History:  Procedure Laterality Date   CARDIAC CATHETERIZATION     CHOLECYSTECTOMY     EYE SURGERY     INNER EAR SURGERY     NOSE SURGERY      Prior to Admission medications   Medication Sig Start Date End Date Taking? Authorizing Provider  acetaminophen (TYLENOL) 325 MG tablet Take 650 mg by mouth every 6 (six) hours as needed.    [provider]  albuterol (ACCUNEB) 0.63 MG/3ML nebulizer solution Take 1 ampule by nebulization every 6 (six) hours as needed for wheezing.    [provider]  albuterol (PROVENTIL HFA;VENTOLIN HFA) 108 (90 Base) MCG/ACT inhaler Inhale 2 puffs into the lungs every 6 (six) hours as needed for wheezing or shortness of breath. 07/09/18   Rudene Re, MD  ascorbic acid (VITAMIN C) 500 MG tablet Take 1 tablet (500 mg total) by mouth daily. 04/20/20   Samuella Cota, MD  cetirizine (ZYRTEC ALLERGY) 10 MG tablet Take 1 tablet (10 mg total) by mouth daily. 07/04/20 08/03/20  Blake Divine, MD  cyclobenzaprine (FLEXERIL) 5 MG tablet Take 5 mg by mouth at bedtime as needed for muscle spasms.     [provider]  ferrous sulfate 325 (65 FE) MG tablet Take 325 mg by mouth daily with breakfast.    [provider]  furosemide (LASIX) 40 MG tablet Take 1 tablet (40 mg total) by mouth daily. 04/19/20  Samuella Cota, MD  glucosamine-chondroitin 500-400 MG tablet Take 1 tablet by mouth 3 (three) times daily.    [provider]  HYDROcodone-acetaminophen (NORCO/VICODIN) 5-325 MG tablet Take 1 tablet by mouth every 6 (six) hours as needed. 01/26/21   Ward, Delice Bison, DO  hydrOXYzine (ATARAX/VISTARIL) 10 MG tablet Take 10 mg by mouth 3 (three) times daily as needed for itching or anxiety.    [provider]  lisinopril (PRINIVIL,ZESTRIL) 10 MG tablet Take 1 tablet (10 mg total) by mouth daily. 04/16/18    Alisa Graff, FNP  meloxicam (MOBIC) 15 MG tablet Take 15 mg by mouth daily.    [provider]  metFORMIN (GLUCOPHAGE-XR) 500 MG 24 hr tablet Take 500 mg by mouth daily with breakfast.    [provider]  Multiple Vitamin (MULTIVITAMIN WITH MINERALS) TABS tablet Take 1 tablet by mouth daily.    [provider]  omeprazole (PRILOSEC) 40 MG capsule Take 40 mg by mouth daily.    [provider]  ondansetron (ZOFRAN ODT) 4 MG disintegrating tablet Take 1 tablet (4 mg total) by mouth every 6 (six) hours as needed for nausea or vomiting. 12/28/20   Ward, Delice Bison, DO  ondansetron (ZOFRAN) 4 MG tablet Take 1 tablet (4 mg total) by mouth daily as needed for nausea or vomiting. Patient not taking: Reported on 12/30/2020 09/19/20 09/19/21  Hinda Kehr, MD  oxyCODONE-acetaminophen (PERCOCET) 5-325 MG tablet Take 1 tablet by mouth every 6 (six) hours as needed for severe pain. 12/28/20   Ward, Delice Bison, DO  PARoxetine (PAXIL) 10 MG tablet Take 10 mg by mouth daily.     [provider]  potassium chloride (K-DUR) 10 MEQ tablet Take 10 mEq by mouth daily.     [provider]  simvastatin (ZOCOR) 40 MG tablet Take 40 mg by mouth daily.    [provider]  tamsulosin (FLOMAX) 0.4 MG CAPS capsule Take 0.4 mg by mouth daily.    [provider]  zinc sulfate 220 (50 Zn) MG capsule Take 1 capsule (220 mg total) by mouth daily. Patient not taking: Reported on 12/30/2020 04/20/20   Samuella Cota, MD    Allergies Novocain [procaine]  Family History  Problem Relation Age of Onset   Hypertension Mother    Heart disease Mother    Heart failure Maternal Grandmother     Social History Social History   Tobacco Use   Smoking status: Former    Pack years: 0.00   Smokeless tobacco: Never  Substance Use Topics   Alcohol use: Never   Drug use: Never    Review of Systems  Constitutional: No fever/chills Eyes: No visual changes. ENT: No  sore throat. Cardiovascular: Denies chest pain. Respiratory: Positive shortness of breath and cough. Gastrointestinal: No abdominal pain.  No diarrhea.  No constipation. Positive for emesis Genitourinary: Negative for dysuria. Musculoskeletal: Negative for back pain. Skin: Negative for rash. Neurological: Negative for headaches, focal weakness or numbness. ____________________________________________  PHYSICAL EXAM:  VITAL SIGNS: Vitals:   03/02/21 0500 03/02/21 0602  BP: (!) 154/81 (!) 146/89  Pulse: (!) 56 61  Resp: 18 20  Temp:    SpO2: 94% 94%     Constitutional: Alert and oriented.  in no acute distress.  Obese. Eyes: Conjunctivae are normal. PERRL. EOMI. Head: Atraumatic. Nose: No congestion/rhinnorhea. Mouth/Throat: Mucous membranes are moist.  Oropharynx non-erythematous. Neck: No stridor. No cervical spine tenderness to palpation. Cardiovascular: Normal rate, regular rhythm. Grossly normal  heart sounds.  Good peripheral circulation. Respiratory: Minimal tachypnea to the low 20s, no further evidence of distress.  No wheezing. Gastrointestinal: Soft , nondistended, nontender to palpation. No CVA tenderness. Musculoskeletal: No lower extremity tenderness .  No joint effusions. No signs of acute trauma. Trace pitting edema to bilateral lower extremities symmetrically. Neurologic:  Normal speech and language. No gross focal neurologic deficits are appreciated. No gait instability noted. Skin:  Skin is warm, dry and intact. No rash noted. Psychiatric: Mood and affect are normal. Speech and behavior are normal. ____________________________________________   LABS (all labs ordered are listed, but only abnormal results are displayed)  Labs Reviewed  BASIC METABOLIC PANEL - Abnormal; Notable for the following components:      Result Value   Potassium 2.9 (*)    Glucose, Bld 143 (*)    All other components within normal limits  CBC - Abnormal; Notable for the following  components:   Platelets 112 (*)    All other components within normal limits  BRAIN NATRIURETIC PEPTIDE - Abnormal; Notable for the following components:   B Natriuretic Peptide 112.1 (*)    All other components within normal limits  TROPONIN I (HIGH SENSITIVITY) - Abnormal; Notable for the following components:   Troponin I (High Sensitivity) 31 (*)    All other components within normal limits  TROPONIN I (HIGH SENSITIVITY) - Abnormal; Notable for the following components:   Troponin I (High Sensitivity) 32 (*)    All other components within normal limits  RESP PANEL BY RT-PCR (FLU A&B, COVID) ARPGX2  LIPASE, BLOOD  CBG MONITORING, ED   ____________________________________________  12 Lead EKG  Sinus rhythm, rate of 63 bpm.  Normal axis and intervals.  No evidence of acute ischemia. ____________________________________________  RADIOLOGY  ED MD interpretation: 1 view CXR reviewed by me without evidence of acute cardiopulmonary pathology.  Official radiology report(s): DG Chest Portable 1 View  Result Date: 03/02/2021 CLINICAL DATA:  Chest pain and shortness of breath EXAM: PORTABLE CHEST 1 VIEW COMPARISON:  09/19/2020 FINDINGS: Heart size and pulmonary vascularity are normal. Lungs are clear. No pleural effusions. No pneumothorax. Mediastinal contours appear intact. Calcification of the aorta. IMPRESSION: No active disease. Electronically Signed   By: Lucienne Capers M.D.   On: 03/02/2021 03:45    ____________________________________________   PROCEDURES and INTERVENTIONS  Procedure(s) performed (including Critical Care):  .1-3 Lead EKG Interpretation  Date/Time: 03/02/2021 3:54 AM Performed by: Vladimir Crofts, MD Authorized by: Vladimir Crofts, MD     Interpretation: normal     ECG rate:  62   ECG rate assessment: normal     Rhythm: sinus rhythm     Ectopy: none     Conduction: normal    Medications  aspirin chewable tablet 324 mg (324 mg Oral Not Given 03/02/21 0316)   acetaminophen (TYLENOL) tablet 1,000 mg (1,000 mg Oral Given 03/02/21 0407)  potassium chloride SA (KLOR-CON) CR tablet 40 mEq (40 mEq Oral Given 03/02/21 0440)  furosemide (LASIX) injection 40 mg (40 mg Intravenous Given 03/02/21 0535)  potassium chloride SA (KLOR-CON) CR tablet 40 mEq (40 mEq Oral Given 03/02/21 0532)  alum & mag hydroxide-simeth (MAALOX/MYLANTA) 200-200-20 MG/5ML suspension 30 mL (30 mLs Oral Given 03/02/21 0533)    And  lidocaine (XYLOCAINE) 2 % viscous mouth solution 15 mL (15 mLs Oral Given 03/02/21 0534)    ____________________________________________   MDM / ED COURSE   70 year old male presents to the ED with chest discomfort, possibly due to very mild  CHF exacerbation, and ultimately amenable to outpatient management.  Minimal hypertension, otherwise normal vitals on room air.  Exam with trace pitting edema, but no evidence of gross volume overload.  No distress, and no wheezing.  His work-up is quite benign without evidence of ACS, CAP.  Has resolution of chest pain after GI cocktail.  Blood work with hypokalemia that was repleted orally as well as elevated BNP typical for him relative to previous readings without remarkable worsening.  Provided single dose of IV Lasix with good urinary output.  With his respiratory symptoms as well as his chest pain, I suspect a degree of CHF exacerbation, but see no indications for admission for this as it is quite mild.  Second troponin is flat and his chest pain has resolved.  I see no barriers to outpatient management this time.  Will discharge with return precautions and referral to [facility], at his request, due to anxiety.  Clinical Course as of 03/02/21 0648  Thu Mar 02, 2021  0646 Reassessed.  Patient reports resolution of chest pain and feeling better now.  Reports he is just "peeing like a race horse."  We discussed adherence to his medication regimen at home and we discussed following up with PCP for potassium recheck.  We  discussed return precautions for the ED. [DS]    Clinical Course User Index [DS] Vladimir Crofts, MD    ____________________________________________   FINAL CLINICAL IMPRESSION(S) / ED DIAGNOSES  Final diagnoses:  Other chest pain     ED Discharge Orders     None        Aislin Onofre Tamala Julian   Note:  This document was prepared using Dragon voice recognition software and may include unintentional dictation errors.    Vladimir Crofts, MD 03/02/21 [phone]
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
## 2021-03-02 NOTE — ED Notes (Signed)
ED Provider at bedside. 

## 2021-03-02 NOTE — ED Notes (Signed)
XR at bedside

## 2021-03-02 NOTE — ED Notes (Signed)
Charge nurse at bedside at this time.
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
## 2021-03-09 ENCOUNTER — Encounter: Payer: Self-pay | Admitting: Family

## 2021-03-09 ENCOUNTER — Other Ambulatory Visit: Payer: Self-pay

## 2021-03-09 ENCOUNTER — Ambulatory Visit (HOSPITAL_BASED_OUTPATIENT_CLINIC_OR_DEPARTMENT_OTHER): Payer: 59 | Admitting: Family

## 2021-03-09 ENCOUNTER — Other Ambulatory Visit
Admission: RE | Admit: 2021-03-09 | Discharge: 2021-03-09 | Disposition: A | Discharge status: Home / Self Care | Payer: 59 | Source: Ambulatory Visit | Attending: Family | Admitting: Family

## 2021-03-09 ENCOUNTER — Inpatient Hospital Stay: Admit: 2021-03-09 | Payer: 59

## 2021-03-09 VITALS — BP 157/69 | HR 50 | Resp 18 | Ht 66.0 in | Wt 298.2 lb

## 2021-03-09 DIAGNOSIS — E119 Type 2 diabetes mellitus without complications: Secondary | ICD-10-CM | POA: Insufficient documentation

## 2021-03-09 DIAGNOSIS — E876 Hypokalemia: Secondary | ICD-10-CM | POA: Insufficient documentation

## 2021-03-09 DIAGNOSIS — I5032 Chronic diastolic (congestive) heart failure: Secondary | ICD-10-CM

## 2021-03-09 DIAGNOSIS — G4733 Obstructive sleep apnea (adult) (pediatric): Secondary | ICD-10-CM | POA: Insufficient documentation

## 2021-03-09 DIAGNOSIS — I110 Hypertensive heart disease with heart failure: Secondary | ICD-10-CM | POA: Insufficient documentation

## 2021-03-09 DIAGNOSIS — Z7901 Long term (current) use of anticoagulants: Secondary | ICD-10-CM | POA: Insufficient documentation

## 2021-03-09 DIAGNOSIS — Z09 Encounter for follow-up examination after completed treatment for conditions other than malignant neoplasm: Secondary | ICD-10-CM | POA: Insufficient documentation

## 2021-03-09 DIAGNOSIS — I10 Essential (primary) hypertension: Secondary | ICD-10-CM

## 2021-03-09 DIAGNOSIS — R0602 Shortness of breath: Secondary | ICD-10-CM | POA: Insufficient documentation

## 2021-03-09 DIAGNOSIS — J449 Chronic obstructive pulmonary disease, unspecified: Secondary | ICD-10-CM | POA: Insufficient documentation

## 2021-03-09 DIAGNOSIS — Z79899 Other long term (current) drug therapy: Secondary | ICD-10-CM | POA: Insufficient documentation

## 2021-03-09 DIAGNOSIS — Z7984 Long term (current) use of oral hypoglycemic drugs: Secondary | ICD-10-CM | POA: Insufficient documentation

## 2021-03-09 DIAGNOSIS — R519 Headache, unspecified: Secondary | ICD-10-CM | POA: Insufficient documentation

## 2021-03-09 DIAGNOSIS — Z791 Long term (current) use of non-steroidal anti-inflammatories (NSAID): Secondary | ICD-10-CM | POA: Insufficient documentation

## 2021-03-09 DIAGNOSIS — Z87891 Personal history of nicotine dependence: Secondary | ICD-10-CM | POA: Insufficient documentation

## 2021-03-09 DIAGNOSIS — Z8249 Family history of ischemic heart disease and other diseases of the circulatory system: Secondary | ICD-10-CM | POA: Insufficient documentation

## 2021-03-09 DIAGNOSIS — Z8616 Personal history of COVID-19: Secondary | ICD-10-CM | POA: Insufficient documentation

## 2021-03-09 LAB — BASIC METABOLIC PANEL
Anion gap: 8 (ref 5–15)
BUN: 14 mg/dL (ref 8–23)
CO2: 26 mmol/L (ref 22–32)
Calcium: 10.2 mg/dL (ref 8.9–10.3)
Chloride: 101 mmol/L (ref 98–111)
Creatinine, Ser: 1.13 mg/dL (ref 0.61–1.24)
GFR, Estimated: >60 mL/min (ref >60)
Glucose, Bld: 241 mg/dL — ABNORMAL HIGH (ref 70–99)
Potassium: 3.5 mmol/L (ref 3.5–5.1)
Sodium: 135 mmol/L (ref 135–145)

## 2021-03-09 NOTE — Patient Instructions (Signed)
Continue weighing daily and call for an overnight weight gain of > 2 pounds or a weekly weight gain of >5 pounds. 

## 2021-03-31 ENCOUNTER — Ambulatory Visit
Admission: RE | Admit: 2021-03-31 | Discharge: 2021-03-31 | Disposition: A | Discharge status: Home / Self Care | Payer: 59 | Source: Ambulatory Visit | Attending: Family | Admitting: Family

## 2021-03-31 ENCOUNTER — Encounter: Payer: Self-pay | Admitting: Family

## 2021-03-31 ENCOUNTER — Other Ambulatory Visit: Payer: Self-pay

## 2021-03-31 ENCOUNTER — Ambulatory Visit (HOSPITAL_BASED_OUTPATIENT_CLINIC_OR_DEPARTMENT_OTHER): Payer: 59 | Admitting: Family

## 2021-03-31 VITALS — BP 186/87 | HR 67 | Resp 18 | Ht 66.0 in | Wt 298.1 lb

## 2021-03-31 DIAGNOSIS — G4733 Obstructive sleep apnea (adult) (pediatric): Secondary | ICD-10-CM | POA: Diagnosis not present

## 2021-03-31 DIAGNOSIS — I340 Nonrheumatic mitral (valve) insufficiency: Secondary | ICD-10-CM | POA: Diagnosis not present

## 2021-03-31 DIAGNOSIS — E119 Type 2 diabetes mellitus without complications: Secondary | ICD-10-CM | POA: Insufficient documentation

## 2021-03-31 DIAGNOSIS — Z87891 Personal history of nicotine dependence: Secondary | ICD-10-CM | POA: Diagnosis not present

## 2021-03-31 DIAGNOSIS — Z79899 Other long term (current) drug therapy: Secondary | ICD-10-CM | POA: Insufficient documentation

## 2021-03-31 DIAGNOSIS — Z7984 Long term (current) use of oral hypoglycemic drugs: Secondary | ICD-10-CM | POA: Diagnosis not present

## 2021-03-31 DIAGNOSIS — Z791 Long term (current) use of non-steroidal anti-inflammatories (NSAID): Secondary | ICD-10-CM | POA: Diagnosis not present

## 2021-03-31 DIAGNOSIS — Z8616 Personal history of COVID-19: Secondary | ICD-10-CM | POA: Insufficient documentation

## 2021-03-31 DIAGNOSIS — I110 Hypertensive heart disease with heart failure: Secondary | ICD-10-CM | POA: Insufficient documentation

## 2021-03-31 DIAGNOSIS — I5032 Chronic diastolic (congestive) heart failure: Secondary | ICD-10-CM

## 2021-03-31 DIAGNOSIS — J449 Chronic obstructive pulmonary disease, unspecified: Secondary | ICD-10-CM | POA: Diagnosis not present

## 2021-03-31 DIAGNOSIS — I10 Essential (primary) hypertension: Secondary | ICD-10-CM

## 2021-03-31 LAB — ECHOCARDIOGRAM COMPLETE
AR max vel: 4.4 cm2
AV Area VTI: 4.04 cm2
AV Area mean vel: 4.01 cm2
AV Mean grad: 9 mmHg
AV Peak grad: 16.6 mmHg
Ao pk vel: 2.04 m/s
Area-P 1/2: 2.67 cm2
Calc EF: 67.4 %
MV VTI: 4.45 cm2
S' Lateral: 3.9 cm
Single Plane A2C EF: 65.1 %
Single Plane A4C EF: 72.4 %

## 2021-03-31 MED ORDER — PERFLUTREN LIPID MICROSPHERE
1.0000 mL | INTRAVENOUS | Status: AC | PRN
  Administered 2021-03-31: 3 mL via INTRAVENOUS
  Filled 2021-03-31: qty 10
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
## 2021-03-31 NOTE — Patient Instructions (Signed)
Continue weighing daily and call for an overnight weight gain of > 2 pounds or a weekly weight gain of >5 pounds. 

## 2021-03-31 NOTE — Progress Notes (Signed)
*  PRELIMINARY RESULTS* Echocardiogram 2D Echocardiogram has been performed.  [name] 03/31/2021, 11:54 AM

## 2021-05-31 ENCOUNTER — Telehealth: Payer: Self-pay | Admitting: Family

## 2021-05-31 ENCOUNTER — Ambulatory Visit: Payer: 59 | Admitting: Family

## 2021-05-31 NOTE — Telephone Encounter (Signed)
Patient did not show for his [facility] appointment on 05/31/21. Will attempt to reschedule.

## 2021-06-05 ENCOUNTER — Encounter: Payer: Self-pay | Admitting: Radiology

## 2021-06-05 ENCOUNTER — Emergency Department: Payer: 59

## 2021-06-05 ENCOUNTER — Other Ambulatory Visit: Payer: Self-pay

## 2021-06-05 DIAGNOSIS — E1165 Type 2 diabetes mellitus with hyperglycemia: Secondary | ICD-10-CM | POA: Diagnosis not present

## 2021-06-05 DIAGNOSIS — Z20822 Contact with and (suspected) exposure to covid-19: Secondary | ICD-10-CM | POA: Diagnosis not present

## 2021-06-05 DIAGNOSIS — I110 Hypertensive heart disease with heart failure: Secondary | ICD-10-CM | POA: Diagnosis not present

## 2021-06-05 DIAGNOSIS — Z8616 Personal history of COVID-19: Secondary | ICD-10-CM | POA: Insufficient documentation

## 2021-06-05 DIAGNOSIS — J45909 Unspecified asthma, uncomplicated: Secondary | ICD-10-CM | POA: Insufficient documentation

## 2021-06-05 DIAGNOSIS — Z79899 Other long term (current) drug therapy: Secondary | ICD-10-CM | POA: Diagnosis not present

## 2021-06-05 DIAGNOSIS — R0602 Shortness of breath: Secondary | ICD-10-CM | POA: Diagnosis present

## 2021-06-05 DIAGNOSIS — Z87891 Personal history of nicotine dependence: Secondary | ICD-10-CM | POA: Diagnosis not present

## 2021-06-05 DIAGNOSIS — Z7984 Long term (current) use of oral hypoglycemic drugs: Secondary | ICD-10-CM | POA: Insufficient documentation

## 2021-06-05 DIAGNOSIS — I5032 Chronic diastolic (congestive) heart failure: Secondary | ICD-10-CM | POA: Diagnosis not present

## 2021-06-05 DIAGNOSIS — J449 Chronic obstructive pulmonary disease, unspecified: Secondary | ICD-10-CM | POA: Insufficient documentation

## 2021-06-05 LAB — CBC
HCT: 45.4 % (ref 39.0–52.0)
Hemoglobin: 15.5 g/dL (ref 13.0–17.0)
MCH: 31.1 pg (ref 26.0–34.0)
MCHC: 34.1 g/dL (ref 30.0–36.0)
MCV: 91.2 fL (ref 80.0–100.0)
Platelets: 119 10*3/uL — ABNORMAL LOW (ref 150–400)
RBC: 4.98 MIL/uL (ref 4.22–5.81)
RDW: 13.2 % (ref 11.5–15.5)
WBC: 4.4 10*3/uL (ref 4.0–10.5)
nRBC: 0 % (ref 0.0–0.2)

## 2021-06-05 LAB — TROPONIN I (HIGH SENSITIVITY): Troponin I (High Sensitivity): 17 ng/L (ref <18)

## 2021-06-05 LAB — COMPREHENSIVE METABOLIC PANEL
ALT: 28 U/L (ref 0–44)
AST: 51 U/L — ABNORMAL HIGH (ref 15–41)
Albumin: 3 g/dL — ABNORMAL LOW (ref 3.5–5.0)
Alkaline Phosphatase: 91 U/L (ref 38–126)
Anion gap: 11 (ref 5–15)
BUN: 14 mg/dL (ref 8–23)
CO2: 28 mmol/L (ref 22–32)
Calcium: 9.6 mg/dL (ref 8.9–10.3)
Chloride: 91 mmol/L — ABNORMAL LOW (ref 98–111)
Creatinine, Ser: 1.38 mg/dL — ABNORMAL HIGH (ref 0.61–1.24)
GFR, Estimated: 55 mL/min — ABNORMAL LOW (ref >60)
Glucose, Bld: 528 mg/dL (ref 70–99)
Potassium: 3.5 mmol/L (ref 3.5–5.1)
Sodium: 130 mmol/L — ABNORMAL LOW (ref 135–145)
Total Bilirubin: 2.1 mg/dL — ABNORMAL HIGH (ref 0.3–1.2)
Total Protein: 7.8 g/dL (ref 6.5–8.1)

## 2021-06-05 LAB — LIPASE, BLOOD: Lipase: 38 U/L (ref 11–51)

## 2021-06-05 NOTE — ED Triage Notes (Signed)
Pt in with co nausea, chest pain, shob that started today. States started after eating meals on wheels and relates symptoms to food.

## 2021-06-06 ENCOUNTER — Emergency Department
Admission: EM | Admit: 2021-06-06 | Discharge: 2021-06-06 | Disposition: A | Discharge status: Home / Self Care | Payer: 59 | Attending: Emergency Medicine | Admitting: Emergency Medicine

## 2021-06-06 ENCOUNTER — Emergency Department: Payer: 59

## 2021-06-06 DIAGNOSIS — R739 Hyperglycemia, unspecified: Secondary | ICD-10-CM

## 2021-06-06 DIAGNOSIS — E1165 Type 2 diabetes mellitus with hyperglycemia: Secondary | ICD-10-CM | POA: Diagnosis not present

## 2021-06-06 DIAGNOSIS — R0602 Shortness of breath: Secondary | ICD-10-CM

## 2021-06-06 LAB — URINALYSIS, COMPLETE (UACMP) WITH MICROSCOPIC
Bacteria, UA: NONE SEEN
Bilirubin Urine: NEGATIVE
Glucose, UA: >=500 mg/dL — AB
Hgb urine dipstick: NEGATIVE
Ketones, ur: NEGATIVE mg/dL
Leukocytes,Ua: NEGATIVE
Nitrite: NEGATIVE
Protein, ur: 30 mg/dL — AB
Specific Gravity, Urine: 1.029 (ref 1.005–1.030)
pH: 5 (ref 5.0–8.0)

## 2021-06-06 LAB — RESP PANEL BY RT-PCR (FLU A&B, COVID) ARPGX2
Influenza A by PCR: NEGATIVE
Influenza B by PCR: NEGATIVE
SARS Coronavirus 2 by RT PCR: NEGATIVE

## 2021-06-06 LAB — TROPONIN I (HIGH SENSITIVITY): Troponin I (High Sensitivity): 16 ng/L (ref <18)

## 2021-06-06 LAB — CBG MONITORING, ED: Glucose-Capillary: 303 mg/dL — ABNORMAL HIGH (ref 70–99)

## 2021-06-06 MED ORDER — INSULIN ASPART 100 UNIT/ML IJ SOLN
10.0000 [IU] | Freq: Once | INTRAMUSCULAR | Status: AC
  Administered 2021-06-06: 10 [IU] via SUBCUTANEOUS
  Filled 2021-06-06: qty 1

## 2021-06-06 MED ORDER — DIPHENHYDRAMINE HCL 25 MG PO CAPS
50.0000 mg | ORAL_CAPSULE | Freq: Once | ORAL | Status: AC
  Administered 2021-06-06: 50 mg via ORAL
  Filled 2021-06-06: qty 2

## 2021-06-06 MED ORDER — SODIUM CHLORIDE 0.9 % IV BOLUS
1000.0000 mL | Freq: Once | INTRAVENOUS | Status: AC
  Administered 2021-06-06: 1000 mL via INTRAVENOUS

## 2021-06-06 NOTE — ED Provider Notes (Addendum)
[facility] Emergency Department Provider Note   ____________________________________________   Event Date/Time   First MD Initiated Contact with Patient 06/06/21 0225     (approximate)  I have reviewed the triage vital signs and the nursing notes.   HISTORY  Chief Complaint Shortness of breath    HPI [name] is a 70 y.o. male who presents to the ED from home with a chief complaint of shortness of breath which started after eating a hamburger from [employer].  Patient with a history of non-insulin-dependent diabetes, COPD, CHF, hypertension who also reports associated nausea and lower chest/upper abdominal pain, generalized weakness and body aches.  History of cholecystectomy.  Denies fever, cough, vomiting, diarrhea.     Past Medical History:  Diagnosis Date   Asthma    CHF (congestive heart failure) ([place])    COPD (chronic obstructive pulmonary disease) ([place])    COVID-19 03/2020   diagnosed in August 2021   Diabetes mellitus without complication ([place])    Homelessness    Hypertension    Migraine    Obesity    Sleep apnea     Patient Active Problem List   Diagnosis Date Noted   Pneumonia due to COVID-19 virus 04/19/2020   Elevated transaminase level 04/18/2020   Obesity, Class III, BMI 40-49.9 (morbid obesity) ([place]) 04/18/2020   Lactic acidosis 04/17/2020   Thrombocytopenia ([place]) 04/17/2020   Leukopenia 04/17/2020   COVID-19 virus infection 04/16/2020   HTN (hypertension) 04/17/2018   DM (diabetes mellitus) ([place]) 04/17/2018   Lymphedema 04/17/2018   Chronic diastolic CHF (congestive heart failure) ([place]) 04/07/2018   Anemia 04/07/2018   Bilateral leg pain 03/29/2018   Chest pain 03/20/2018   Adjustment disorder with mixed anxiety and depressed mood 12/16/2017   Homelessness 12/16/2017    Past Surgical History:  Procedure Laterality Date   CARDIAC CATHETERIZATION     CHOLECYSTECTOMY     EYE SURGERY     INNER EAR SURGERY      NOSE SURGERY      Prior to Admission medications   Medication Sig Start Date End Date Taking? Authorizing Provider  acetaminophen (TYLENOL) 325 MG tablet Take 650 mg by mouth every 6 (six) hours as needed.   Yes [provider]  albuterol (PROVENTIL HFA;VENTOLIN HFA) 108 (90 Base) MCG/ACT inhaler Inhale 2 puffs into the lungs every 6 (six) hours as needed for wheezing or shortness of breath. 07/09/18  Yes Alfred Levins, [place], MD  albuterol (PROVENTIL) (2.5 MG/3ML) 0.083% nebulizer solution Take 3 mLs by nebulization every 4 (four) hours as needed. 05/06/21  Yes [provider]  ascorbic acid (VITAMIN C) 500 MG tablet Take 1 tablet (500 mg total) by mouth daily. 04/20/20  Yes Samuella Cota, MD  cetirizine (ZYRTEC ALLERGY) 10 MG tablet Take 1 tablet (10 mg total) by mouth daily. 07/04/20  Yes Blake Divine, MD  ferrous sulfate 325 (65 FE) MG tablet Take 325 mg by mouth daily with breakfast.   Yes [provider]  fluticasone (FLONASE) 50 MCG/ACT nasal spray Place 1 spray into both nostrils daily. 05/06/21  Yes [provider]  furosemide (LASIX) 80 MG tablet Take 80 mg by mouth daily. 05/28/21  Yes [provider]  glucosamine-chondroitin 500-400 MG tablet Take 1 tablet by mouth 3 (three) times daily.   Yes [provider]  hydrOXYzine (ATARAX/VISTARIL) 10 MG tablet Take 10 mg by mouth 3 (three) times daily as needed for itching or anxiety.   Yes [provider]  JARDIANCE 10 MG TABS tablet Take 10 mg by mouth daily. 05/06/21  Yes [provider]  lisinopril (ZESTRIL) 20 MG tablet Take 20 mg by mouth daily. 05/27/21  Yes [provider]  meloxicam (MOBIC) 15 MG tablet Take 15 mg by mouth daily.   Yes [provider]  metFORMIN (GLUCOPHAGE-XR) 500 MG 24 hr tablet Take 500 mg by mouth daily with breakfast.   Yes [provider]  Multiple Vitamin (MULTIVITAMIN WITH MINERALS) TABS tablet Take 1 tablet  by mouth daily.   Yes [provider]  omeprazole (PRILOSEC) 40 MG capsule Take 40 mg by mouth daily.   Yes [provider]  potassium chloride (K-DUR) 10 MEQ tablet Take 10 mEq by mouth daily.    Yes [provider]  simvastatin (ZOCOR) 40 MG tablet Take 40 mg by mouth daily.   Yes [provider]  tamsulosin (FLOMAX) 0.4 MG CAPS capsule Take 0.4 mg by mouth daily.   Yes [provider]  EPINEPHrine 0.3 mg/0.3 mL IJ SOAJ injection Inject into the muscle. 02/25/21   [provider]    Allergies Novocain [procaine] and Sulfa antibiotics  Family History  Problem Relation Age of Onset   Hypertension Mother    Heart disease Mother    Heart failure Maternal Grandmother     Social History Social History   Tobacco Use   Smoking status: Former   Smokeless tobacco: Never  Substance Use Topics   Alcohol use: Never   Drug use: Never    Review of Systems  Constitutional: No fever/chills Eyes: No visual changes. ENT: No sore throat. Cardiovascular: Positive for chest pain. Respiratory: Positive for shortness of breath. Gastrointestinal: Positive for upper abdominal pain and nausea, no vomiting.  No diarrhea.  No constipation. Genitourinary: Negative for dysuria. Musculoskeletal: Negative for back pain. Skin: Negative for rash. Neurological: Negative for headaches, focal weakness or numbness.   ____________________________________________   PHYSICAL EXAM:  VITAL SIGNS: ED Triage Vitals  Enc Vitals Group     BP 06/05/21 2145 119/80     Pulse Rate 06/05/21 2145 85     Resp 06/05/21 2145 20     Temp 06/05/21 2145 98.8 F (37.1 C)     Temp Source 06/05/21 2145 Oral     SpO2 06/05/21 2145 100 %     Weight 06/05/21 2147 (!) 308 lb (139.7 kg)     Height 06/05/21 2147 5\' 6"  (1.676 m)     Head Circumference --      Peak Flow --      Pain Score 06/05/21 2147 10     Pain Loc --      Pain Edu? --      Excl. in [place]? --      Constitutional: Alert and oriented.  Disheveled appearing and in no acute distress. Eyes: Conjunctivae are normal. PERRL. EOMI. Head: Atraumatic. Nose: No congestion/rhinnorhea. Mouth/Throat: Mucous membranes are moist.   Neck: No stridor.   Cardiovascular: Normal rate, regular rhythm. Grossly normal heart sounds.  Good peripheral circulation. Respiratory: Normal respiratory effort.  No retractions. Lungs CTAB. Gastrointestinal: Soft and mildly tender to palpation right upper quadrant without rebound or guard. No distention. No abdominal bruits. No CVA tenderness. Musculoskeletal: No lower extremity tenderness nor edema.  No joint effusions. Neurologic:  Normal speech and language. No gross focal neurologic deficits are appreciated. No gait instability. Skin:  Skin is warm, dry and intact. No rash noted. Psychiatric: Mood and affect are normal.  Speech and behavior are normal.  ____________________________________________   LABS (all labs ordered are listed, but only abnormal results are displayed)  Labs Reviewed  CBC - Abnormal; Notable for the following components:      Result Value   Platelets 119 (*)    All other components within normal limits  COMPREHENSIVE METABOLIC PANEL - Abnormal; Notable for the following components:   Sodium 130 (*)    Chloride 91 (*)    Glucose, Bld 528 (*)    Creatinine, Ser 1.38 (*)    Albumin 3.0 (*)    AST 51 (*)    Total Bilirubin 2.1 (*)    GFR, Estimated 55 (*)    All other components within normal limits  URINALYSIS, COMPLETE (UACMP) WITH MICROSCOPIC - Abnormal; Notable for the following components:   Color, Urine YELLOW (*)    APPearance CLEAR (*)    Glucose, UA >=500 (*)    Protein, ur 30 (*)    All other components within normal limits  CBG MONITORING, ED - Abnormal; Notable for the following components:   Glucose-Capillary 303 (*)    All other components within normal limits  RESP PANEL BY RT-PCR (FLU A&B, COVID) [id number]   LIPASE, BLOOD  TROPONIN I (HIGH SENSITIVITY)  TROPONIN I (HIGH SENSITIVITY)   ____________________________________________  EKG  ED ECG REPORT I, Dionysios Massman J, the attending physician, personally viewed and interpreted this ECG.   Date: 06/06/2021  EKG Time: 2152  Rate: 89  Rhythm: normal sinus rhythm  Axis: Normal  Intervals:none  ST&T Change: Nonspecific  ____________________________________________  RADIOLOGY I, Tobey Lippard J, personally viewed and evaluated these images (plain radiographs) as part of my medical decision making, as well as reviewing the written report by the radiologist.  ED MD interpretation: No acute cardiopulmonary process; postoperative dilated CBD likely normal  Official radiology report(s): DG Chest 2 View  Result Date: 06/05/2021 CLINICAL DATA:  Chest pain, short of breath, nausea EXAM: CHEST - 2 VIEW COMPARISON:  03/02/2021 FINDINGS: The heart size and mediastinal contours are within normal limits. Both lungs are clear. The visualized skeletal structures are unremarkable. IMPRESSION: No active cardiopulmonary disease. Electronically Signed   By: Randa Ngo M.D.   On: 06/05/2021 22:26   US ABDOMEN LIMITED RUQ (LIVER/GB)  Result Date: 06/06/2021 CLINICAL DATA:  Right upper quadrant pain. Previous cholecystectomy. EXAM: ULTRASOUND ABDOMEN LIMITED RIGHT UPPER QUADRANT COMPARISON:  None. FINDINGS: Gallbladder: The gallbladder is surgically absent. Common bile duct: Diameter: 16 mm. Bile duct is dilated. This is likely normal for postoperative physiology. Liver: Diffusely increased heterogeneous and nodular parenchymal echotexture with nodular contours. Changes are consistent with hepatic cirrhosis. No focal lesions identified. Portal vein is patent on color Doppler imaging with normal direction of blood flow towards the liver. 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 Other: None. IMPRESSION: 1. Surgical absence of the gallbladder. Dilated bile duct is  likely normal for postoperative physiology. 2. 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 Electronically Signed   By: Lucienne Capers M.D.   On: 06/06/2021 03:45    ____________________________________________   PROCEDURES  Procedure(s) performed (including Critical Care):  .1-3 Lead EKG Interpretation Performed by: Paulette Blanch, MD Authorized by: Paulette Blanch, MD     Interpretation: normal     ECG rate:  80   ECG rate assessment: normal     Rhythm: sinus rhythm     Ectopy: none     Conduction: normal   Comments:     Patient placed on cardiac  monitor to evaluate for arrhythmias   ____________________________________________   INITIAL IMPRESSION / ASSESSMENT AND PLAN / ED COURSE  As part of my medical decision making, I reviewed the following data within the [place] notes reviewed and incorporated, Labs reviewed, EKG interpreted, Old chart reviewed, Radiograph reviewed, and Notes from prior ED visits     70 year old male presenting with shortness of breath, nausea, chest/upper abdominal pain after eating [employer] hamburger. Differential includes, but is not limited to, viral syndrome, bronchitis including COPD exacerbation, pneumonia, reactive airway disease including asthma, CHF including exacerbation with or without pulmonary/interstitial edema, pneumothorax, ACS, thoracic trauma, and pulmonary embolism.   Laboratory results remarkable for mild hyponatremia, hyperglycemia without elevation of anion gap, mild AKI.  Mildly elevated T bili without prior for comparison.  Will obtain RUQ ultrasound, COVID swab, administer IV fluids, insulin and reassess.  Clinical Course as of 06/06/21 0648  Tue Jun 06, 2021  0357 Patient complaining of itching all over.  He has only received IV fluids and insulin.  No discernible rash.  No angioedema or airway distress.  Will administer oral Benadryl [JS]  9485 Blood sugar improved.  Awaiting UA  results. [JS]  [id number] Updated patient on all test results.  Strict return precautions given.  Patient verbalizes understanding agrees with plan of care. [JS]    Clinical Course User Index [JS] Paulette Blanch, MD     ____________________________________________   FINAL CLINICAL IMPRESSION(S) / ED DIAGNOSES  Final diagnoses:  Hyperglycemia  SOB (shortness of breath)     ED Discharge Orders     None        Note:  This document was prepared using Dragon voice recognition software and may include unintentional dictation errors.    Paulette Blanch, MD 06/06/21 4627    Paulette Blanch, MD 06/06/21 [phone]

## 2021-06-06 NOTE — Discharge Instructions (Addendum)
Take your medicines as directed by your doctor.  Return to the ER for worsening symptoms, persistent vomiting, difficulty breathing or other concerns. 

## 2021-07-30 ENCOUNTER — Encounter: Payer: Self-pay | Admitting: Radiology

## 2021-07-30 ENCOUNTER — Emergency Department: Payer: Medicare Other

## 2021-07-30 ENCOUNTER — Other Ambulatory Visit: Payer: Self-pay

## 2021-07-30 DIAGNOSIS — Z79899 Other long term (current) drug therapy: Secondary | ICD-10-CM | POA: Insufficient documentation

## 2021-07-30 DIAGNOSIS — Z7952 Long term (current) use of systemic steroids: Secondary | ICD-10-CM | POA: Diagnosis not present

## 2021-07-30 DIAGNOSIS — R0602 Shortness of breath: Secondary | ICD-10-CM | POA: Diagnosis not present

## 2021-07-30 DIAGNOSIS — E119 Type 2 diabetes mellitus without complications: Secondary | ICD-10-CM | POA: Insufficient documentation

## 2021-07-30 DIAGNOSIS — Z7984 Long term (current) use of oral hypoglycemic drugs: Secondary | ICD-10-CM | POA: Insufficient documentation

## 2021-07-30 DIAGNOSIS — J449 Chronic obstructive pulmonary disease, unspecified: Secondary | ICD-10-CM | POA: Diagnosis not present

## 2021-07-30 DIAGNOSIS — R0789 Other chest pain: Secondary | ICD-10-CM | POA: Insufficient documentation

## 2021-07-30 DIAGNOSIS — I110 Hypertensive heart disease with heart failure: Secondary | ICD-10-CM | POA: Diagnosis not present

## 2021-07-30 DIAGNOSIS — R209 Unspecified disturbances of skin sensation: Secondary | ICD-10-CM | POA: Insufficient documentation

## 2021-07-30 DIAGNOSIS — I5032 Chronic diastolic (congestive) heart failure: Secondary | ICD-10-CM | POA: Diagnosis not present

## 2021-07-30 DIAGNOSIS — Z87891 Personal history of nicotine dependence: Secondary | ICD-10-CM | POA: Insufficient documentation

## 2021-07-30 DIAGNOSIS — Z20822 Contact with and (suspected) exposure to covid-19: Secondary | ICD-10-CM | POA: Diagnosis not present

## 2021-07-30 DIAGNOSIS — Z8616 Personal history of COVID-19: Secondary | ICD-10-CM | POA: Insufficient documentation

## 2021-07-30 LAB — COMPREHENSIVE METABOLIC PANEL
ALT: 28 U/L (ref 0–44)
AST: 51 U/L — ABNORMAL HIGH (ref 15–41)
Albumin: 2.7 g/dL — ABNORMAL LOW (ref 3.5–5.0)
Alkaline Phosphatase: 104 U/L (ref 38–126)
Anion gap: 6 (ref 5–15)
BUN: 7 mg/dL — ABNORMAL LOW (ref 8–23)
CO2: 27 mmol/L (ref 22–32)
Calcium: 9.1 mg/dL (ref 8.9–10.3)
Chloride: 104 mmol/L (ref 98–111)
Creatinine, Ser: 0.88 mg/dL (ref 0.61–1.24)
GFR, Estimated: >60 mL/min (ref >60)
Glucose, Bld: 320 mg/dL — ABNORMAL HIGH (ref 70–99)
Potassium: 3.5 mmol/L (ref 3.5–5.1)
Sodium: 137 mmol/L (ref 135–145)
Total Bilirubin: 1.4 mg/dL — ABNORMAL HIGH (ref 0.3–1.2)
Total Protein: 6.6 g/dL (ref 6.5–8.1)

## 2021-07-30 LAB — CBC
HCT: 39.1 % (ref 39.0–52.0)
Hemoglobin: 12.9 g/dL — ABNORMAL LOW (ref 13.0–17.0)
MCH: 31.1 pg (ref 26.0–34.0)
MCHC: 33 g/dL (ref 30.0–36.0)
MCV: 94.2 fL (ref 80.0–100.0)
Platelets: 99 10*3/uL — ABNORMAL LOW (ref 150–400)
RBC: 4.15 MIL/uL — ABNORMAL LOW (ref 4.22–5.81)
RDW: 13.9 % (ref 11.5–15.5)
WBC: 3.3 10*3/uL — ABNORMAL LOW (ref 4.0–10.5)
nRBC: 0 % (ref 0.0–0.2)

## 2021-07-30 LAB — TROPONIN I (HIGH SENSITIVITY): Troponin I (High Sensitivity): 19 ng/L — ABNORMAL HIGH (ref <18)

## 2021-07-30 NOTE — ED Triage Notes (Signed)
Pt states was at [facility] and hour ago and became shob, had chest pain and diarrhea. Per ems pt with unremarkable 12 lead ekg. Pt able to speak in full sentences. Pt states has had a cough.

## 2021-07-31 ENCOUNTER — Emergency Department
Admission: EM | Admit: 2021-07-31 | Discharge: 2021-07-31 | Disposition: A | Discharge status: Home / Self Care | Payer: Medicare Other | Attending: Emergency Medicine | Admitting: Emergency Medicine

## 2021-07-31 ENCOUNTER — Emergency Department: Payer: Medicare Other

## 2021-07-31 ENCOUNTER — Telehealth: Payer: Self-pay | Admitting: Family

## 2021-07-31 DIAGNOSIS — R0609 Other forms of dyspnea: Secondary | ICD-10-CM

## 2021-07-31 DIAGNOSIS — R079 Chest pain, unspecified: Secondary | ICD-10-CM

## 2021-07-31 DIAGNOSIS — R0789 Other chest pain: Secondary | ICD-10-CM | POA: Diagnosis not present

## 2021-07-31 LAB — RESP PANEL BY RT-PCR (FLU A&B, COVID) ARPGX2
Influenza A by PCR: NEGATIVE
Influenza B by PCR: NEGATIVE
SARS Coronavirus 2 by RT PCR: NEGATIVE

## 2021-07-31 LAB — TROPONIN I (HIGH SENSITIVITY)
Troponin I (High Sensitivity): 21 ng/L — ABNORMAL HIGH (ref <18)
Troponin I (High Sensitivity): 22 ng/L — ABNORMAL HIGH (ref <18)

## 2021-07-31 MED ORDER — MIDAZOLAM HCL 2 MG/2ML IJ SOLN
1.0000 mg | Freq: Once | INTRAMUSCULAR | Status: AC
  Administered 2021-07-31: 1 mg via INTRAVENOUS
  Filled 2021-07-31: qty 2

## 2021-07-31 MED ORDER — NITROGLYCERIN 2 % TD OINT
1.0000 [in_us] | TOPICAL_OINTMENT | Freq: Once | TRANSDERMAL | Status: AC
  Administered 2021-07-31: 1 [in_us] via TOPICAL
  Filled 2021-07-31: qty 1

## 2021-07-31 MED ORDER — ACETAMINOPHEN 325 MG PO TABS
650.0000 mg | ORAL_TABLET | Freq: Once | ORAL | Status: AC | PRN
  Administered 2021-07-31: 650 mg via ORAL
  Filled 2021-07-31: qty 2

## 2021-07-31 NOTE — ED Provider Notes (Signed)
[facility] Emergency Department Provider Note  ____________________________________________   Event Date/Time   First MD Initiated Contact with Patient 07/31/21 0258     (approximate)  I have reviewed the triage vital signs and the nursing notes.   HISTORY  Chief Complaint Chest Pain    HPI [name] is a 70 y.o. male  with medical history as listed below who presents for evaluation of acute onset severe chest pressure with associated severe shortness of breath.  This occurred while he was headed to [employer].  He claims that he does not usually have chest pain and shortness of breath.  The onset was acute and severe, and he said that it has continued at least moderate in severity.  Shortness of breath and chest pressure are worse with exertion.  He denies sharp pain.  He denies pain in his abdomen.  No recent illnesses such as fever or chills.  He said he has been told in the past that he has COPD but he has never smoked.  He is also been told that he has CHF and asthma.  Nothing in particular makes his symptoms better.  Of note, he also reports that at the same time his chest/respiratory symptoms started, he felt numb all throughout the right side of his body from his right face down through and including his right arm and right leg.  He said he is not weak, but it feels like he is wearing a glove on his right hand, for example, and he has not used to this weakness either.  The symptoms started approximately 9 hours ago and have not gotten any better either.     Past Medical History:  Diagnosis Date   Asthma    CHF (congestive heart failure) ([place])    COPD (chronic obstructive pulmonary disease) ([place])    COVID-19 03/2020   diagnosed in August 2021   Diabetes mellitus without complication ([place])    Homelessness    Hypertension    Migraine    Obesity    Sleep apnea     Patient Active Problem List   Diagnosis Date Noted   Pneumonia due to COVID-19  virus 04/19/2020   Elevated transaminase level 04/18/2020   Obesity, Class III, BMI 40-49.9 (morbid obesity) ([place]) 04/18/2020   Lactic acidosis 04/17/2020   Thrombocytopenia ([place]) 04/17/2020   Leukopenia 04/17/2020   COVID-19 virus infection 04/16/2020   HTN (hypertension) 04/17/2018   DM (diabetes mellitus) ([place]) 04/17/2018   Lymphedema 04/17/2018   Chronic diastolic CHF (congestive heart failure) ([place]) 04/07/2018   Anemia 04/07/2018   Bilateral leg pain 03/29/2018   Chest pain 03/20/2018   Adjustment disorder with mixed anxiety and depressed mood 12/16/2017   Homelessness 12/16/2017    Past Surgical History:  Procedure Laterality Date   CARDIAC CATHETERIZATION     CHOLECYSTECTOMY     EYE SURGERY     INNER EAR SURGERY     NOSE SURGERY      Prior to Admission medications   Medication Sig Start Date End Date Taking? Authorizing Provider  acetaminophen (TYLENOL) 325 MG tablet Take 650 mg by mouth every 6 (six) hours as needed.    [provider]  albuterol (PROVENTIL HFA;VENTOLIN HFA) 108 (90 Base) MCG/ACT inhaler Inhale 2 puffs into the lungs every 6 (six) hours as needed for wheezing or shortness of breath. 07/09/18   Rudene Re, MD  albuterol (PROVENTIL) (2.5 MG/3ML) 0.083% nebulizer solution Take 3 mLs by nebulization every 4 (four)  hours as needed. 05/06/21   [provider]  ascorbic acid (VITAMIN C) 500 MG tablet Take 1 tablet (500 mg total) by mouth daily. 04/20/20   Samuella Cota, MD  cetirizine (ZYRTEC ALLERGY) 10 MG tablet Take 1 tablet (10 mg total) by mouth daily. 07/04/20   Blake Divine, MD  EPINEPHrine 0.3 mg/0.3 mL IJ SOAJ injection Inject into the muscle. 02/25/21   [provider]  ferrous sulfate 325 (65 FE) MG tablet Take 325 mg by mouth daily with breakfast.    [provider]  fluticasone (FLONASE) 50 MCG/ACT nasal spray Place 1 spray into both nostrils daily. 05/06/21   [provider]  furosemide (LASIX)  80 MG tablet Take 80 mg by mouth daily. 05/28/21   [provider]  glucosamine-chondroitin 500-400 MG tablet Take 1 tablet by mouth 3 (three) times daily.    [provider]  hydrOXYzine (ATARAX/VISTARIL) 10 MG tablet Take 10 mg by mouth 3 (three) times daily as needed for itching or anxiety.    [provider]  JARDIANCE 10 MG TABS tablet Take 10 mg by mouth daily. 05/06/21   [provider]  lisinopril (ZESTRIL) 20 MG tablet Take 20 mg by mouth daily. 05/27/21   [provider]  meloxicam (MOBIC) 15 MG tablet Take 15 mg by mouth daily.    [provider]  metFORMIN (GLUCOPHAGE-XR) 500 MG 24 hr tablet Take 500 mg by mouth daily with breakfast.    [provider]  Multiple Vitamin (MULTIVITAMIN WITH MINERALS) TABS tablet Take 1 tablet by mouth daily.    [provider]  omeprazole (PRILOSEC) 40 MG capsule Take 40 mg by mouth daily.    [provider]  potassium chloride (K-DUR) 10 MEQ tablet Take 10 mEq by mouth daily.     [provider]  simvastatin (ZOCOR) 40 MG tablet Take 40 mg by mouth daily.    [provider]  tamsulosin (FLOMAX) 0.4 MG CAPS capsule Take 0.4 mg by mouth daily.    [provider]    Allergies Novocain [procaine] and Sulfa antibiotics  Family History  Problem Relation Age of Onset   Hypertension Mother    Heart disease Mother    Heart failure Maternal Grandmother     Social History Social History   Tobacco Use   Smoking status: Former   Smokeless tobacco: Never  Substance Use Topics   Alcohol use: Never   Drug use: Never    Review of Systems Constitutional: No fever/chills Eyes: No visual changes. ENT: No sore throat. Cardiovascular: Positive for chest pressure. Respiratory: Positive for shortness of breath, worse with exertion. Gastrointestinal: No abdominal pain.  No nausea, no vomiting.  No diarrhea.  No constipation. Genitourinary: Negative for  dysuria. Musculoskeletal: Negative for neck pain.  Negative for back pain. Integumentary: Negative for rash. Neurological: Positive for numbness throughout the right side of his body including his head/face, arm, and leg.  Denies focal weakness.   ____________________________________________   PHYSICAL EXAM:  VITAL SIGNS: ED Triage Vitals  Enc Vitals Group     BP 07/30/21 2042 (!) 152/80     Pulse Rate 07/30/21 2042 (!) 59     Resp 07/30/21 2042 20     Temp 07/30/21 2042 99 F (37.2 C)     Temp Source 07/30/21 2042 Oral     SpO2 07/30/21 2042 97 %     Weight 07/30/21 2035 132 kg (291 lb)     Height 07/30/21 2035  1.524 m (5')     Head Circumference --      Peak Flow --      Pain Score 07/30/21 2035 8     Pain Loc --      Pain Edu? --      Excl. in [place]? --     Constitutional: Alert and oriented.  Eyes: Conjunctivae are normal.  Head: Atraumatic. Nose: No congestion/rhinnorhea. Mouth/Throat: Patient is wearing a mask. Neck: No stridor.  No meningeal signs.   Cardiovascular: Normal rate, regular rhythm. Good peripheral circulation. Respiratory: Normal respiratory effort.  No retractions.  Lungs are clear to auscultation bilaterally but the patient does have a least a little bit of tachypnea but no accessory muscle usage. Gastrointestinal: Obese.  Soft and nontender. No distention.  Musculoskeletal: No lower extremity tenderness nor edema. No gross deformities of extremities. Neurologic:  Normal speech and language. No gross focal neurologic deficits are appreciated but he does report subjective decrease sensation on his right extremities in particular.  No obvious cranial nerve deficits. Skin:  Skin is warm, dry and intact. Psychiatric: Mood and affect are normal. Speech and behavior are normal.  ____________________________________________   LABS (all labs ordered are listed, but only abnormal results are displayed)  Labs Reviewed  CBC - Abnormal; Notable for the  following components:      Result Value   WBC 3.3 (*)    RBC 4.15 (*)    Hemoglobin 12.9 (*)    Platelets 99 (*)    All other components within normal limits  COMPREHENSIVE METABOLIC PANEL - Abnormal; Notable for the following components:   Glucose, Bld 320 (*)    BUN 7 (*)    Albumin 2.7 (*)    AST 51 (*)    Total Bilirubin 1.4 (*)    All other components within normal limits  TROPONIN I (HIGH SENSITIVITY) - Abnormal; Notable for the following components:   Troponin I (High Sensitivity) 19 (*)    All other components within normal limits  TROPONIN I (HIGH SENSITIVITY) - Abnormal; Notable for the following components:   Troponin I (High Sensitivity) 22 (*)    All other components within normal limits  TROPONIN I (HIGH SENSITIVITY) - Abnormal; Notable for the following components:   Troponin I (High Sensitivity) 21 (*)    All other components within normal limits  RESP PANEL BY RT-PCR (FLU A&B, COVID) [id number]   ____________________________________________  EKG  ED ECG REPORT I, Hinda Kehr, the attending physician, personally viewed and interpreted this ECG.  Date: 07/30/2021 EKG Time: 20: 36 Rate: 60 Rhythm: normal sinus rhythm (computer says junctional rhythm but I see small P waves QRS Axis: normal Intervals: normal ST/T Wave abnormalities: Non-specific ST segment / T-wave changes, but no clear evidence of acute ischemia. Narrative Interpretation: no definitive evidence of acute ischemia; does not meet STEMI criteria.  ____________________________________________  RADIOLOGY I, Hinda Kehr, personally viewed and evaluated these images (plain radiographs) as part of my medical decision making, as well as reviewing the written report by the radiologist.  ED MD interpretation: No acute abnormality on chest x-ray.  No acute abnormality on brain MRI.  Official radiology report(s): DG Chest 2 View  Result Date: 07/30/2021 CLINICAL DATA:  cp EXAM: CHEST - 2 VIEW  COMPARISON:  June 05, 2021 FINDINGS: The cardiomediastinal silhouette is unchanged in contour. No pleural effusion. No pneumothorax. No acute pleuroparenchymal abnormality. Surgical clips project over the upper abdomen. Multilevel degenerative changes of the thoracic spine. IMPRESSION: No acute  cardiopulmonary abnormality. Electronically Signed   By: Valentino Saxon M.D.   On: 07/30/2021 20:58   MR BRAIN WO CONTRAST  Result Date: 07/31/2021 CLINICAL DATA:  Neuro deficit, acute, stroke suspected right sided numbness x 9 hours EXAM: MRI HEAD WITHOUT CONTRAST TECHNIQUE: Multiplanar, multiecho pulse sequences of the brain and surrounding structures were obtained without intravenous contrast. COMPARISON:  CT head Dec 28, 2020. FINDINGS: Brain: No acute infarction, hemorrhage, hydrocephalus, extra-axial collection or mass lesion. T1 hyperintensity in bilateral globus palladi. Vascular: Major arterial flow voids are maintained at the skull base. Skull and upper cervical spine: Normal marrow signal. Sinuses/Orbits: No acute findings. Other: No mastoid effusions. IMPRESSION: 1. No evidence of acute intracranial abnormality.  No acute infarct. 2. T1 hyperintensity in bilateral globus palladi, probably related to chronic hepatic encephalopathy given changes of cirrhosis on prior abdominal imaging. Electronically Signed   By: Margaretha Sheffield M.D.   On: 07/31/2021 05:54    ____________________________________________   PROCEDURES   Procedure(s) performed (including Critical Care):  .1-3 Lead EKG Interpretation Performed by: Hinda Kehr, MD Authorized by: Hinda Kehr, MD     Interpretation: normal     ECG rate:  60   ECG rate assessment: normal     Rhythm: sinus rhythm     Ectopy: none     Conduction: normal     ____________________________________________   INITIAL IMPRESSION / MDM / ASSESSMENT AND PLAN / ED COURSE  As part of my medical decision making, I reviewed the following data  within the [place] notes reviewed and incorporated, Labs reviewed , EKG interpreted , Old chart reviewed, Radiograph reviewed , and Notes from prior ED visits   Differential diagnosis includes, but is not limited to, angina, unstable angina or other form of ACS, PE, AAS, CVA.  The patient is on the cardiac monitor to evaluate for evidence of arrhythmia and/or significant heart rate changes.  Patient's vital signs are generally reassuring other than hypertension.  High-sensitivity troponin is slightly elevated at 19, 22, and 21, so no significant change over time is slightly elevated over baseline, and it has been elevated in the past..  Comprehensive metabolic panel is essentially normal other than very minor discrepancies including hyperglycemia which he anticipated given his history of diabetes.  Respiratory viral panel is negative.  CBC is essentially normal other than some mild thrombocytopenia and mild leukopenia.  The symptoms he is describing could represent unstable angina, but he also appears very comfortable, watching TV in no distress,.  His right-sided numbness is a complicating factor; his NIH stroke scale is 1 and his symptoms have been present for 9 hours.  I doubt that his symptoms are representative of CVA or AAS.  I am placing nitroglycerin 1 inch on his chest to see if this helps with the chest pain and I am obtaining an MR brain without contrast to evaluate for possible CVA which could also explain a very slight troponin leak.  After the MRI I will reassess his comfort and plan.  Options include admission to the hospital for further chest pain evaluation, treatment with heparin for unstable angina, or discharge with close follow-up.  I reviewed the medical record and see that he has been referred to [facility] in the CHF clinic in the past, and he identified Ms. Hackney by name as someone that he is seen, although he did not attend the last clinic  appointment.  I will reassess after imaging.     Clinical Course  as of 07/31/21 0724  Mon Jul 31, 2021  0601 MR BRAIN WO CONTRAST No evidence of acute infarction on MRI [CF]  0657 The patient says he feels better and is no longer having chest pain.  I provided the reassuring results of no abnormality on the MRI.  Of note, the patient was checking the bus schedule when I came into the room.  He said he is fine with discharge and would like to follow-up with Darylene Price in the CHF clinic.  I updated [facility] with his new phone number.  He certainly has some risk factors for ACS, but he is currently pain-free and has 3 stable troponins. [CF]    Clinical Course User Index [CF] Hinda Kehr, MD     ____________________________________________  FINAL CLINICAL IMPRESSION(S) / ED DIAGNOSES  Final diagnoses:  Chest pain, unspecified type  Exertional dyspnea     MEDICATIONS GIVEN DURING THIS VISIT:  Medications  acetaminophen (TYLENOL) tablet 650 mg (650 mg Oral Given 07/31/21 0308)  nitroGLYCERIN (NITROGLYN) 2 % ointment 1 inch (1 inch Topical Given 07/31/21 0423)  midazolam (VERSED) injection 1 mg (1 mg Intravenous Given 07/31/21 0455)     ED Discharge Orders          Ordered    AMB referral to CHF clinic       Comments: Patient wants to come back to clinic, has been having chest pain and SOB.  Phone number has been updated in [place].   07/31/21 0645             Note:  This document was prepared using Dragon voice recognition software and may include unintentional dictation errors.   Hinda Kehr, MD 07/31/21 [phone]

## 2021-07-31 NOTE — Discharge Instructions (Signed)

## 2021-07-31 NOTE — Telephone Encounter (Signed)
LVM in attempt to schedule a follow up appointment after patient was recently seen in the ED.   [name], NT

## 2021-08-01 NOTE — Progress Notes (Deleted)
Patient ID: [name], male    DOB: 1951-07-26, 70 y.o.   MRN: [id number]  HPI  [name] is a 70 y/o male with a history of DM, HTN, COPD, migraines, obstructive sleep apnea and chronic heart failure.  Echo report from 03/31/21 reviewed and showed an EF of 55-60% along with mild LVH. Echo report from 03/20/18 reviewed and showed an EF of 60-65% along with mild/moderate Drew and a mildly elevated PA pressure of 42 mm Hg.   Was in the ED 07/31/21 due to chest pressure and shortness of breath. Also had right sided numbness. Brain MRI without acute findings. Stable labs and [name] was released. Was in the ED 06/06/21 due to shortness of breath. Evaluated and released. Was in the ED 03/02/21 due to chest pain. Pain improved after GI cocktail. Given a dose of IV lasix. Symptoms improved and [name] was released.   [name] presents today for a follow-up visit with a chief complaint of     Past Medical History:  Diagnosis Date   Asthma    CHF (congestive heart failure) ([place])    COPD (chronic obstructive pulmonary disease) ([place])    COVID-19 03/2020   diagnosed in August 2021   Diabetes mellitus without complication ([place])    Homelessness    Hypertension    Migraine    Obesity    Sleep apnea    Past Surgical History:  Procedure Laterality Date   CARDIAC CATHETERIZATION     CHOLECYSTECTOMY     EYE SURGERY     INNER EAR SURGERY     NOSE SURGERY     Family History  Problem Relation Age of Onset   Hypertension Mother    Heart disease Mother    Heart failure Maternal Grandmother    Social History   Tobacco Use   Smoking status: Former   Smokeless tobacco: Never  Substance Use Topics   Alcohol use: Never   Allergies  Allergen Reactions   Novocain [Procaine]    Sulfa Antibiotics Other (See Comments)     Review of Systems  Constitutional:  Positive for fatigue. Negative for appetite change.  HENT:  Negative for congestion, postnasal drip and sinus pressure.   Eyes:  Positive for visual  disturbance (left eye).  Respiratory:  Positive for cough (dry) and shortness of breath (minimal although worse with humidity). Negative for chest tightness.   Cardiovascular:  Positive for chest pain (at times). Negative for palpitations and leg swelling.  Gastrointestinal:  Positive for abdominal distention (at times). Negative for abdominal pain.  Endocrine: Negative.   Genitourinary: Negative.   Musculoskeletal:  Negative for back pain and neck pain.  Skin: Negative.   Allergic/Immunologic: Negative.   Neurological:  Positive for headaches. Negative for dizziness and light-headedness.  Hematological:  Negative for adenopathy. Does not bruise/bleed easily.  Psychiatric/Behavioral:  Positive for sleep disturbance (trouble sleeping; sleeping on 2 pillows). Negative for dysphoric mood. The patient is not nervous/anxious.      Physical Exam Vitals and nursing note reviewed.  Constitutional:      Appearance: Normal appearance.  HENT:     Head: Normocephalic and atraumatic.  Cardiovascular:     Rate and Rhythm: Normal rate and regular rhythm.  Pulmonary:     Effort: Pulmonary effort is normal. No respiratory distress.     Breath sounds: No wheezing or rales.  Abdominal:     General: There is no distension.     Palpations: Abdomen is soft.  Tenderness: There is no abdominal tenderness.  Musculoskeletal:        General: No tenderness.     Cervical back: Normal range of motion and neck supple.     Right lower leg: No edema.     Left lower leg: No edema.  Skin:    General: Skin is warm and dry.  Neurological:     General: No focal deficit present.     Mental Status: [name] is alert and oriented to person, place, and time.  Psychiatric:        Mood and Affect: Mood normal.        Behavior: Behavior normal.        Thought Content: Thought content normal.   Assessment & Plan:  1: Chronic heart failure with preserved ejection fraction with structural changes (LVH)- - NYHA class  II - euvolemic today - not weighing daily as [name] says his weight has been stable; reminded to call for an overnight weight gain of >2 pounds or a weekly weight gain of >5 pounds - weight 298.2 from last visit here 4 months ago - not adding salt to his food - had telemedicine visit with cardiology [name]) 12/02/2018 - BNP 03/02/21 was 112.1  2: HTN- - BP  - saw PCP at [place] 07/30/21 reviewed and showed sodium 137, potassium 3.5, creatinine 0.88 and GFR >60  3: DM- - A1c 03/20/18 was 5.5% - says that his glucose was    Patient did not bring his medications nor a list. Each medication was verbally reviewed with the patient and [name] was encouraged to bring the bottles to every visit to confirm accuracy of list.

## 2021-08-02 ENCOUNTER — Ambulatory Visit: Payer: Medicaid Other | Admitting: Family

## 2021-08-02 ENCOUNTER — Telehealth: Payer: Self-pay | Admitting: Family

## 2021-08-02 NOTE — Telephone Encounter (Signed)
Patient did not show for his Heart Failure Clinic appointment on 08/02/21 Will attempt to reschedule.

## 2021-08-27 DIAGNOSIS — B961 Klebsiella pneumoniae [K. pneumoniae] as the cause of diseases classified elsewhere: Secondary | ICD-10-CM

## 2021-08-27 DIAGNOSIS — R7881 Bacteremia: Secondary | ICD-10-CM

## 2021-08-27 HISTORY — DX: Klebsiella pneumoniae (k. pneumoniae) as the cause of diseases classified elsewhere: B96.1

## 2021-08-27 HISTORY — DX: Bacteremia: R78.81

## 2021-09-04 NOTE — Progress Notes (Deleted)
Patient ID: [name], male    DOB: 01-Aug-1951, 71 y.o.   MRN: [id number]  HPI  [name] is a 71 y/o male with a history of DM, HTN, COPD, migraines, obstructive sleep apnea and chronic heart failure.  Echo report from 03/31/21 reviewed and showed an EF of 55-60% along with mild LVH/LAE. Echo report from 03/20/18 reviewed and showed an EF of 60-65% along with mild/moderate Drew and a mildly elevated PA pressure of 42 mm Hg.   Was in the ED 07/31/21 due to chest pain and shortness of breath and numbness down the right side of his body. Brain MRI without acute infarct. Symptoms improved and he was released. Was in the ED 06/06/21 due to shortness of breath after eating a hamburger. Evaluated and released.   He presents today for a follow-up visit with a chief complaint of   Past Medical History:  Diagnosis Date   Asthma    CHF (congestive heart failure) ([place])    COPD (chronic obstructive pulmonary disease) ([place])    COVID-19 03/2020   diagnosed in August 2021   Diabetes mellitus without complication ([place])    Homelessness    Hypertension    Migraine    Obesity    Sleep apnea    Past Surgical History:  Procedure Laterality Date   CARDIAC CATHETERIZATION     CHOLECYSTECTOMY     EYE SURGERY     INNER EAR SURGERY     NOSE SURGERY     Family History  Problem Relation Age of Onset   Hypertension Mother    Heart disease Mother    Heart failure Maternal Grandmother    Social History   Tobacco Use   Smoking status: Former   Smokeless tobacco: Never  Substance Use Topics   Alcohol use: Never   Allergies  Allergen Reactions   Novocain [Procaine]    Sulfa Antibiotics Other (See Comments)     Review of Systems  Constitutional:  Positive for fatigue. Negative for appetite change.  HENT:  Negative for congestion, postnasal drip and sinus pressure.   Eyes:  Positive for visual disturbance (left eye).  Respiratory:  Positive for cough (dry) and shortness of breath (minimal  although worse with humidity). Negative for chest tightness.   Cardiovascular:  Positive for chest pain (at times). Negative for palpitations and leg swelling.  Gastrointestinal:  Positive for abdominal distention (at times). Negative for abdominal pain.  Endocrine: Negative.   Genitourinary: Negative.   Musculoskeletal:  Negative for back pain and neck pain.  Skin: Negative.   Allergic/Immunologic: Negative.   Neurological:  Positive for headaches. Negative for dizziness and light-headedness.  Hematological:  Negative for adenopathy. Does not bruise/bleed easily.  Psychiatric/Behavioral:  Positive for sleep disturbance (trouble sleeping; sleeping on 2 pillows). Negative for dysphoric mood. The patient is not nervous/anxious.      Physical Exam Vitals and nursing note reviewed.  Constitutional:      Appearance: Normal appearance.  HENT:     Head: Normocephalic and atraumatic.  Cardiovascular:     Rate and Rhythm: Normal rate and regular rhythm.  Pulmonary:     Effort: Pulmonary effort is normal. No respiratory distress.     Breath sounds: No wheezing or rales.  Abdominal:     General: There is no distension.     Palpations: Abdomen is soft.     Tenderness: There is no abdominal tenderness.  Musculoskeletal:        General: No tenderness.  Cervical back: Normal range of motion and neck supple.     Right lower leg: No edema.     Left lower leg: No edema.  Skin:    General: Skin is warm and dry.  Neurological:     General: No focal deficit present.     Mental Status: He is alert and oriented to person, place, and time.  Psychiatric:        Mood and Affect: Mood normal.        Behavior: Behavior normal.        Thought Content: Thought content normal.   Assessment & Plan:  1: Chronic heart failure with preserved ejection fraction with structural changes- - NYHA class II - euvolemic today - not weighing daily as he says his weight has been stable; reminded to call for an  overnight weight gain of >2 pounds or a weekly weight gain of >5 pounds - weight 298.2 from last visit here 5 months ago - not adding salt to his food - ? entresto - had telemedicine visit with cardiology [name]) 12/02/2018 - BNP 03/02/21 was 112.1  2: HTN- - BP  - saw PCP at [facility] 07/30/21 reviewed and showed sodium 137, potassium 3.5, creatinine 0.88 and GFR >60  3: DM- - A1c 03/20/18 was 5.5% - says that his glucose was    Patient did not bring his medications nor a list. Each medication was verbally reviewed with the patient and he was encouraged to bring the bottles to every visit to confirm accuracy of list.

## 2021-09-05 ENCOUNTER — Ambulatory Visit: Payer: Medicaid Other | Admitting: Family

## 2021-10-03 ENCOUNTER — Encounter: Payer: Self-pay | Admitting: *Deleted

## 2021-10-03 ENCOUNTER — Other Ambulatory Visit: Payer: Self-pay

## 2021-10-03 ENCOUNTER — Observation Stay
Admission: EM | Admit: 2021-10-03 | Discharge: 2021-10-05 | Disposition: A | Discharge status: Home / Self Care | Payer: Medicare Other | Attending: Internal Medicine | Admitting: Internal Medicine

## 2021-10-03 ENCOUNTER — Emergency Department: Payer: Medicare Other

## 2021-10-03 DIAGNOSIS — Z20822 Contact with and (suspected) exposure to covid-19: Secondary | ICD-10-CM | POA: Diagnosis not present

## 2021-10-03 DIAGNOSIS — Z79899 Other long term (current) drug therapy: Secondary | ICD-10-CM | POA: Insufficient documentation

## 2021-10-03 DIAGNOSIS — R778 Other specified abnormalities of plasma proteins: Secondary | ICD-10-CM | POA: Diagnosis not present

## 2021-10-03 DIAGNOSIS — I110 Hypertensive heart disease with heart failure: Secondary | ICD-10-CM | POA: Diagnosis not present

## 2021-10-03 DIAGNOSIS — R079 Chest pain, unspecified: Secondary | ICD-10-CM

## 2021-10-03 DIAGNOSIS — R072 Precordial pain: Principal | ICD-10-CM | POA: Insufficient documentation

## 2021-10-03 DIAGNOSIS — R7989 Other specified abnormal findings of blood chemistry: Secondary | ICD-10-CM

## 2021-10-03 DIAGNOSIS — Z87891 Personal history of nicotine dependence: Secondary | ICD-10-CM | POA: Diagnosis not present

## 2021-10-03 DIAGNOSIS — Z7984 Long term (current) use of oral hypoglycemic drugs: Secondary | ICD-10-CM | POA: Insufficient documentation

## 2021-10-03 DIAGNOSIS — J45909 Unspecified asthma, uncomplicated: Secondary | ICD-10-CM | POA: Insufficient documentation

## 2021-10-03 DIAGNOSIS — Z8619 Personal history of other infectious and parasitic diseases: Secondary | ICD-10-CM

## 2021-10-03 DIAGNOSIS — J449 Chronic obstructive pulmonary disease, unspecified: Secondary | ICD-10-CM | POA: Diagnosis not present

## 2021-10-03 DIAGNOSIS — N179 Acute kidney failure, unspecified: Secondary | ICD-10-CM | POA: Diagnosis not present

## 2021-10-03 DIAGNOSIS — Z8616 Personal history of COVID-19: Secondary | ICD-10-CM | POA: Diagnosis not present

## 2021-10-03 DIAGNOSIS — E1165 Type 2 diabetes mellitus with hyperglycemia: Secondary | ICD-10-CM

## 2021-10-03 DIAGNOSIS — R739 Hyperglycemia, unspecified: Secondary | ICD-10-CM

## 2021-10-03 DIAGNOSIS — E66813 Obesity, class 3: Secondary | ICD-10-CM | POA: Diagnosis present

## 2021-10-03 DIAGNOSIS — Z23 Encounter for immunization: Secondary | ICD-10-CM | POA: Diagnosis not present

## 2021-10-03 DIAGNOSIS — I509 Heart failure, unspecified: Secondary | ICD-10-CM | POA: Insufficient documentation

## 2021-10-03 DIAGNOSIS — S60511A Abrasion of right hand, initial encounter: Secondary | ICD-10-CM

## 2021-10-03 DIAGNOSIS — R0789 Other chest pain: Secondary | ICD-10-CM

## 2021-10-03 LAB — CBC
HCT: 41.6 % (ref 39.0–52.0)
Hemoglobin: 14.1 g/dL (ref 13.0–17.0)
MCH: 31.6 pg (ref 26.0–34.0)
MCHC: 33.9 g/dL (ref 30.0–36.0)
MCV: 93.3 fL (ref 80.0–100.0)
Platelets: 115 10*3/uL — ABNORMAL LOW (ref 150–400)
RBC: 4.46 MIL/uL (ref 4.22–5.81)
RDW: 13.9 % (ref 11.5–15.5)
WBC: 4.4 10*3/uL (ref 4.0–10.5)
nRBC: 0 % (ref 0.0–0.2)

## 2021-10-03 LAB — BASIC METABOLIC PANEL
Anion gap: 9 (ref 5–15)
BUN: 10 mg/dL (ref 8–23)
CO2: 25 mmol/L (ref 22–32)
Calcium: 8.7 mg/dL — ABNORMAL LOW (ref 8.9–10.3)
Chloride: 98 mmol/L (ref 98–111)
Creatinine, Ser: 1.46 mg/dL — ABNORMAL HIGH (ref 0.61–1.24)
GFR, Estimated: 51 mL/min — ABNORMAL LOW (ref >60)
Glucose, Bld: 635 mg/dL (ref 70–99)
Potassium: 3.5 mmol/L (ref 3.5–5.1)
Sodium: 132 mmol/L — ABNORMAL LOW (ref 135–145)

## 2021-10-03 LAB — TROPONIN I (HIGH SENSITIVITY): Troponin I (High Sensitivity): 20 ng/L — ABNORMAL HIGH (ref <18)

## 2021-10-03 MED ORDER — LIDOCAINE VISCOUS HCL 2 % MT SOLN
15.0000 mL | Freq: Once | OROMUCOSAL | Status: AC
  Administered 2021-10-04: 15 mL via ORAL
  Filled 2021-10-03: qty 15

## 2021-10-03 MED ORDER — PANTOPRAZOLE SODIUM 40 MG PO TBEC
40.0000 mg | DELAYED_RELEASE_TABLET | Freq: Once | ORAL | Status: AC
  Administered 2021-10-04: 40 mg via ORAL
  Filled 2021-10-03: qty 1

## 2021-10-03 MED ORDER — TETANUS-DIPHTH-ACELL PERTUSSIS 5-2.5-18.5 LF-MCG/0.5 IM SUSY
0.5000 mL | PREFILLED_SYRINGE | Freq: Once | INTRAMUSCULAR | Status: AC
  Administered 2021-10-04: 0.5 mL via INTRAMUSCULAR
  Filled 2021-10-03: qty 0.5

## 2021-10-03 MED ORDER — ALUM & MAG HYDROXIDE-SIMETH 200-200-20 MG/5ML PO SUSP
30.0000 mL | Freq: Once | ORAL | Status: AC
  Administered 2021-10-04: 30 mL via ORAL
  Filled 2021-10-03: qty 30

## 2021-10-03 MED ORDER — FUROSEMIDE 40 MG PO TABS: 80.0000 mg | ORAL_TABLET | Freq: Once | ORAL | Status: DC

## 2021-10-03 MED ORDER — METFORMIN HCL ER 500 MG PO TB24
500.0000 mg | ORAL_TABLET | Freq: Once | ORAL | Status: AC
  Administered 2021-10-04: 500 mg via ORAL
  Filled 2021-10-03 (×2): qty 1

## 2021-10-03 MED ORDER — LACTATED RINGERS IV BOLUS
1000.0000 mL | Freq: Once | INTRAVENOUS | Status: AC
  Administered 2021-10-04: 1000 mL via INTRAVENOUS

## 2021-10-03 MED ORDER — BACITRACIN ZINC 500 UNIT/GM EX OINT
TOPICAL_OINTMENT | Freq: Once | CUTANEOUS | Status: AC
  Filled 2021-10-03: qty 0.9
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
## 2021-10-03 NOTE — ED Notes (Signed)
Critical Result by lab: Glucose 635 First nurse and MD Ward made aware.
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
## 2021-10-04 ENCOUNTER — Observation Stay (HOSPITAL_BASED_OUTPATIENT_CLINIC_OR_DEPARTMENT_OTHER): Payer: Medicare Other

## 2021-10-04 ENCOUNTER — Other Ambulatory Visit: Payer: Self-pay

## 2021-10-04 DIAGNOSIS — R7989 Other specified abnormal findings of blood chemistry: Secondary | ICD-10-CM

## 2021-10-04 DIAGNOSIS — R072 Precordial pain: Secondary | ICD-10-CM

## 2021-10-04 DIAGNOSIS — R079 Chest pain, unspecified: Secondary | ICD-10-CM | POA: Diagnosis not present

## 2021-10-04 DIAGNOSIS — E7800 Pure hypercholesterolemia, unspecified: Secondary | ICD-10-CM | POA: Insufficient documentation

## 2021-10-04 DIAGNOSIS — J449 Chronic obstructive pulmonary disease, unspecified: Secondary | ICD-10-CM

## 2021-10-04 DIAGNOSIS — R778 Other specified abnormalities of plasma proteins: Secondary | ICD-10-CM

## 2021-10-04 DIAGNOSIS — I10 Essential (primary) hypertension: Secondary | ICD-10-CM

## 2021-10-04 DIAGNOSIS — Z8619 Personal history of other infectious and parasitic diseases: Secondary | ICD-10-CM

## 2021-10-04 DIAGNOSIS — N179 Acute kidney failure, unspecified: Secondary | ICD-10-CM

## 2021-10-04 LAB — CBG MONITORING, ED
Glucose-Capillary: 124 mg/dL — ABNORMAL HIGH (ref 70–99)
Glucose-Capillary: 137 mg/dL — ABNORMAL HIGH (ref 70–99)
Glucose-Capillary: 210 mg/dL — ABNORMAL HIGH (ref 70–99)
Glucose-Capillary: 312 mg/dL — ABNORMAL HIGH (ref 70–99)
Glucose-Capillary: 351 mg/dL — ABNORMAL HIGH (ref 70–99)
Glucose-Capillary: 481 mg/dL — ABNORMAL HIGH (ref 70–99)

## 2021-10-04 LAB — RESP PANEL BY RT-PCR (FLU A&B, COVID) ARPGX2
Influenza A by PCR: NEGATIVE
Influenza B by PCR: NEGATIVE
SARS Coronavirus 2 by RT PCR: NEGATIVE

## 2021-10-04 LAB — CBC
HCT: 39 % (ref 39.0–52.0)
Hemoglobin: 13 g/dL (ref 13.0–17.0)
MCH: 31.6 pg (ref 26.0–34.0)
MCHC: 33.3 g/dL (ref 30.0–36.0)
MCV: 94.7 fL (ref 80.0–100.0)
Platelets: 109 10*3/uL — ABNORMAL LOW (ref 150–400)
RBC: 4.12 MIL/uL — ABNORMAL LOW (ref 4.22–5.81)
RDW: 13.9 % (ref 11.5–15.5)
WBC: 5.3 10*3/uL (ref 4.0–10.5)
nRBC: 0 % (ref 0.0–0.2)

## 2021-10-04 LAB — CREATININE, SERUM
Creatinine, Ser: 1.31 mg/dL — ABNORMAL HIGH (ref 0.61–1.24)
GFR, Estimated: 58 mL/min — ABNORMAL LOW (ref >60)

## 2021-10-04 LAB — HEPATIC FUNCTION PANEL
ALT: 36 U/L (ref 0–44)
AST: 60 U/L — ABNORMAL HIGH (ref 15–41)
Albumin: 2.6 g/dL — ABNORMAL LOW (ref 3.5–5.0)
Alkaline Phosphatase: 131 U/L — ABNORMAL HIGH (ref 38–126)
Bilirubin, Direct: 0.6 mg/dL — ABNORMAL HIGH (ref 0.0–0.2)
Indirect Bilirubin: 1.1 mg/dL — ABNORMAL HIGH (ref 0.3–0.9)
Total Bilirubin: 1.7 mg/dL — ABNORMAL HIGH (ref 0.3–1.2)
Total Protein: 6.9 g/dL (ref 6.5–8.1)

## 2021-10-04 LAB — BRAIN NATRIURETIC PEPTIDE: B Natriuretic Peptide: 115.4 pg/mL — ABNORMAL HIGH (ref 0.0–100.0)

## 2021-10-04 LAB — GLUCOSE, CAPILLARY
Glucose-Capillary: 192 mg/dL — ABNORMAL HIGH (ref 70–99)
Glucose-Capillary: 194 mg/dL — ABNORMAL HIGH (ref 70–99)

## 2021-10-04 LAB — LIPID PANEL
Cholesterol: 164 mg/dL (ref 0–200)
HDL: 50 mg/dL (ref >40)
LDL Cholesterol: 100 mg/dL — ABNORMAL HIGH (ref 0–99)
Total CHOL/HDL Ratio: 3.3 RATIO
Triglycerides: 71 mg/dL (ref <150)
VLDL: 14 mg/dL (ref 0–40)

## 2021-10-04 LAB — TROPONIN I (HIGH SENSITIVITY)
Troponin I (High Sensitivity): 26 ng/L — ABNORMAL HIGH (ref <18)
Troponin I (High Sensitivity): 31 ng/L — ABNORMAL HIGH (ref <18)

## 2021-10-04 LAB — HEMOGLOBIN A1C
Hgb A1c MFr Bld: 7.7 % — ABNORMAL HIGH (ref 4.8–5.6)
Mean Plasma Glucose: 174.29 mg/dL

## 2021-10-04 LAB — LIPASE, BLOOD: Lipase: 64 U/L — ABNORMAL HIGH (ref 11–51)

## 2021-10-04 MED ORDER — REGADENOSON 0.4 MG/5ML IV SOLN
0.4000 mg | Freq: Once | INTRAVENOUS | Status: AC
  Administered 2021-10-04: 0.4 mg via INTRAVENOUS
  Filled 2021-10-04: qty 5

## 2021-10-04 MED ORDER — NITROGLYCERIN 0.4 MG SL SUBL: 0.4000 mg | SUBLINGUAL_TABLET | SUBLINGUAL | Status: DC | PRN

## 2021-10-04 MED ORDER — MORPHINE SULFATE (PF) 4 MG/ML IV SOLN
4.0000 mg | Freq: Once | INTRAVENOUS | Status: AC
Start: 2021-10-04 — End: 2021-10-04
  Administered 2021-10-04: 4 mg via INTRAVENOUS
  Filled 2021-10-04: qty 1

## 2021-10-04 MED ORDER — ASPIRIN EC 81 MG PO TBEC
81.0000 mg | DELAYED_RELEASE_TABLET | Freq: Every day | ORAL | Status: DC
  Administered 2021-10-05: 81 mg via ORAL
  Filled 2021-10-04: qty 1

## 2021-10-04 MED ORDER — NITROGLYCERIN 0.4 MG SL SUBL
0.4000 mg | SUBLINGUAL_TABLET | SUBLINGUAL | Status: AC | PRN
  Administered 2021-10-04 (×3): 0.4 mg via SUBLINGUAL
  Filled 2021-10-04 (×2): qty 1

## 2021-10-04 MED ORDER — ONDANSETRON HCL 4 MG/2ML IJ SOLN
4.0000 mg | Freq: Once | INTRAMUSCULAR | Status: AC
  Administered 2021-10-04: 4 mg via INTRAVENOUS
  Filled 2021-10-04: qty 2

## 2021-10-04 MED ORDER — INSULIN ASPART 100 UNIT/ML IJ SOLN
8.0000 [IU] | Freq: Once | INTRAMUSCULAR | Status: AC
  Administered 2021-10-04: 8 [IU] via SUBCUTANEOUS
  Filled 2021-10-04: qty 1

## 2021-10-04 MED ORDER — ASPIRIN 81 MG PO CHEW
324.0000 mg | CHEWABLE_TABLET | Freq: Once | ORAL | Status: AC
  Administered 2021-10-04: 324 mg via ORAL
  Filled 2021-10-04: qty 4

## 2021-10-04 MED ORDER — ACETAMINOPHEN 325 MG PO TABS
650.0000 mg | ORAL_TABLET | ORAL | Status: DC | PRN
  Administered 2021-10-04: 650 mg via ORAL
  Filled 2021-10-04: qty 2

## 2021-10-04 MED ORDER — ENOXAPARIN SODIUM 80 MG/0.8ML IJ SOSY
0.5000 mg/kg | PREFILLED_SYRINGE | INTRAMUSCULAR | Status: DC
  Administered 2021-10-04 – 2021-10-05 (×2): 67.5 mg via SUBCUTANEOUS
  Filled 2021-10-04: qty 0.68
  Filled 2021-10-04: qty 0.8

## 2021-10-04 MED ORDER — ONDANSETRON HCL 4 MG/2ML IJ SOLN
4.0000 mg | Freq: Four times a day (QID) | INTRAMUSCULAR | Status: DC | PRN
  Administered 2021-10-04: 4 mg via INTRAVENOUS
  Filled 2021-10-04: qty 2

## 2021-10-04 MED ORDER — INSULIN ASPART 100 UNIT/ML IJ SOLN
0.0000 [IU] | Freq: Three times a day (TID) | INTRAMUSCULAR | Status: DC
  Administered 2021-10-04: 2 [IU] via SUBCUTANEOUS
  Administered 2021-10-04: 3 [IU] via SUBCUTANEOUS
  Administered 2021-10-04 – 2021-10-05 (×2): 5 [IU] via SUBCUTANEOUS
  Filled 2021-10-04 (×4): qty 1

## 2021-10-04 MED ORDER — TECHNETIUM TC 99M TETROFOSMIN IV KIT
30.5000 | PACK | Freq: Once | INTRAVENOUS | Status: AC | PRN
  Administered 2021-10-04: 30.5 via INTRAVENOUS

## 2021-10-04 MED ORDER — ISOSORBIDE MONONITRATE ER 30 MG PO TB24
30.0000 mg | ORAL_TABLET | Freq: Every day | ORAL | Status: DC
Start: 2021-10-04 — End: 2021-10-05
  Administered 2021-10-04 – 2021-10-05 (×2): 30 mg via ORAL
  Filled 2021-10-04 (×2): qty 1

## 2021-10-04 MED ORDER — INSULIN ASPART 100 UNIT/ML IJ SOLN
5.0000 [IU] | Freq: Once | INTRAMUSCULAR | Status: AC
  Administered 2021-10-04: 5 [IU] via INTRAVENOUS
  Filled 2021-10-04: qty 1

## 2021-10-04 MED ORDER — INSULIN ASPART 100 UNIT/ML IJ SOLN: 0.0000 [IU] | Freq: Every day | INTRAMUSCULAR | Status: DC

## 2021-10-04 MED ORDER — INSULIN ASPART 100 UNIT/ML IJ SOLN: 5.0000 [IU] | Freq: Once | INTRAMUSCULAR | Status: DC

## 2021-10-04 MED ORDER — PANTOPRAZOLE SODIUM 40 MG IV SOLR
40.0000 mg | INTRAVENOUS | Status: DC
  Administered 2021-10-04 – 2021-10-05 (×2): 40 mg via INTRAVENOUS
  Filled 2021-10-04 (×2): qty 10

## 2021-10-04 MED ORDER — TECHNETIUM TC 99M TETROFOSMIN IV KIT
10.0000 | PACK | Freq: Once | INTRAVENOUS | Status: AC
  Administered 2021-10-04: 10.2 via INTRAVENOUS

## 2021-10-04 MED ORDER — MAGNESIUM SULFATE 2 GM/50ML IV SOLN
2.0000 g | Freq: Once | INTRAVENOUS | Status: AC
  Administered 2021-10-04: 2 g via INTRAVENOUS
  Filled 2021-10-04: qty 50

## 2021-10-04 NOTE — Assessment & Plan Note (Signed)
Patient received IV insulin in ED with improvement in blood sugar Continue sliding scale insulin

## 2021-10-04 NOTE — Assessment & Plan Note (Signed)
Consider further work-up inpatient versus outpatient

## 2021-10-04 NOTE — Assessment & Plan Note (Addendum)
Possibly cardiac, possibly GI Troponin mostly flat with just gentle upward trend Aspirin, nitroglycerin to assess response, morphine as needed Cardiology consult to decide on restratification Cath was being planned due to recent outpatient echo on 2/3 with EF 30%

## 2021-10-04 NOTE — ED Notes (Signed)
To nuclear medicine, alert, NAD, calm, interactive, no changes.

## 2021-10-04 NOTE — Progress Notes (Signed)
PHARMACIST - PHYSICIAN COMMUNICATION  CONCERNING:  Enoxaparin (Lovenox) for DVT Prophylaxis    RECOMMENDATION: Patient was prescribed enoxaprin 40mg  q24 hours for VTE prophylaxis.   Filed Weights   10/03/21 2217  Weight: 134.7 kg (297 lb)    Body mass index is 47.94 kg/m.  Estimated Creatinine Clearance: 59.6 mL/min (A) (by C-G formula based on SCr of 1.46 mg/dL (H)).   Based on [place] patient is candidate for enoxaparin 0.5mg /kg TBW SQ every 24 hours based on BMI being >30.  DESCRIPTION: Pharmacy has adjusted enoxaparin dose per [facility] policy.  Patient is now receiving enoxaparin 0.5 mg/kg every 24 hours   Renda Rolls, PharmD, [facility] 10/04/2021 4:10 AM

## 2021-10-04 NOTE — ED Notes (Signed)
Cardiology at [facility]
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
## 2021-10-04 NOTE — Progress Notes (Signed)
Patient admitted earlier this morning for chest pain.  Patient seen and examined at bedside and plan of care discussed with him.  I have reviewed patient's medical records including this morning's H&P, current vitals, labs, medications myself.  Awaiting cardiology evaluation.

## 2021-10-04 NOTE — ED Notes (Signed)
Standing at [facility] to use urinal, NPO except for clear liquids and meds, insulin given for BS, scheduled for nuc stress test at 1400.
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
## 2021-10-04 NOTE — Assessment & Plan Note (Signed)
DuoNebs as needed 

## 2021-10-04 NOTE — ED Notes (Addendum)
Pt remains in nuclear studies. Will be there for another hour. Pt to go from nuclear medicine to the floor 237A per receiving RN Danton Sewer, RN. Nuclear medicine notified.

## 2021-10-04 NOTE — Assessment & Plan Note (Signed)
IV hydration and monitor 

## 2021-10-04 NOTE — ED Notes (Signed)
No changes. Alert, NAD, calm, interactive. Watching TV.

## 2021-10-04 NOTE — Assessment & Plan Note (Signed)
Complicating factor to overall prognosis and care 

## 2021-10-04 NOTE — Assessment & Plan Note (Addendum)
History of hepatitis C Can consider GI work-up and further evaluation with RUQ ultrasound
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
## 2021-10-04 NOTE — Plan of Care (Signed)
  Problem: Education: Goal: Knowledge of General Education information will improve Description Including pain rating scale, medication(s)/side effects and non-pharmacologic comfort measures Outcome: Progressing   

## 2021-10-04 NOTE — Assessment & Plan Note (Signed)
Management as above Chest pain is atypical, troponin with slight upward trend, EKG nonacute Cardiology consult as above

## 2021-10-05 DIAGNOSIS — R7989 Other specified abnormal findings of blood chemistry: Secondary | ICD-10-CM

## 2021-10-05 DIAGNOSIS — R079 Chest pain, unspecified: Secondary | ICD-10-CM | POA: Diagnosis not present

## 2021-10-05 DIAGNOSIS — J432 Centrilobular emphysema: Secondary | ICD-10-CM | POA: Diagnosis not present

## 2021-10-05 DIAGNOSIS — N179 Acute kidney failure, unspecified: Secondary | ICD-10-CM

## 2021-10-05 DIAGNOSIS — E1165 Type 2 diabetes mellitus with hyperglycemia: Secondary | ICD-10-CM

## 2021-10-05 DIAGNOSIS — R778 Other specified abnormalities of plasma proteins: Secondary | ICD-10-CM | POA: Diagnosis not present

## 2021-10-05 DIAGNOSIS — R072 Precordial pain: Secondary | ICD-10-CM | POA: Diagnosis not present

## 2021-10-05 DIAGNOSIS — R739 Hyperglycemia, unspecified: Secondary | ICD-10-CM

## 2021-10-05 LAB — NM MYOCAR MULTI W/SPECT W/WALL MOTION / EF
LV dias vol: 65 mL (ref 62–150)
LV sys vol: 11 mL
MPHR: 148 {beats}/min
Nuc Stress EF: 83 %
Peak HR: 75 {beats}/min
Percent HR: 50 %
Rest HR: 65 {beats}/min
Rest Nuclear Isotope Dose: 10.2 mCi
SDS: 2
SRS: 1
SSS: 0
ST Depression (mm): 0 mm
Stress Nuclear Isotope Dose: 30.5 mCi
TID: 1.21

## 2021-10-05 LAB — COMPREHENSIVE METABOLIC PANEL
ALT: 32 U/L (ref 0–44)
AST: 54 U/L — ABNORMAL HIGH (ref 15–41)
Albumin: 2.4 g/dL — ABNORMAL LOW (ref 3.5–5.0)
Alkaline Phosphatase: 89 U/L (ref 38–126)
Anion gap: 5 (ref 5–15)
BUN: 15 mg/dL (ref 8–23)
CO2: 29 mmol/L (ref 22–32)
Calcium: 8.5 mg/dL — ABNORMAL LOW (ref 8.9–10.3)
Chloride: 101 mmol/L (ref 98–111)
Creatinine, Ser: 1.36 mg/dL — ABNORMAL HIGH (ref 0.61–1.24)
GFR, Estimated: 55 mL/min — ABNORMAL LOW (ref >60)
Glucose, Bld: 197 mg/dL — ABNORMAL HIGH (ref 70–99)
Potassium: 3.8 mmol/L (ref 3.5–5.1)
Sodium: 135 mmol/L (ref 135–145)
Total Bilirubin: 2 mg/dL — ABNORMAL HIGH (ref 0.3–1.2)
Total Protein: 6.3 g/dL — ABNORMAL LOW (ref 6.5–8.1)

## 2021-10-05 LAB — CBC WITH DIFFERENTIAL/PLATELET
Abs Immature Granulocytes: 0.02 10*3/uL (ref 0.00–0.07)
Basophils Absolute: 0.1 10*3/uL (ref 0.0–0.1)
Basophils Relative: 1 %
Eosinophils Absolute: 0.2 10*3/uL (ref 0.0–0.5)
Eosinophils Relative: 3 %
HCT: 38.8 % — ABNORMAL LOW (ref 39.0–52.0)
Hemoglobin: 13 g/dL (ref 13.0–17.0)
Immature Granulocytes: 0 %
Lymphocytes Relative: 18 %
Lymphs Abs: 0.9 10*3/uL (ref 0.7–4.0)
MCH: 31.4 pg (ref 26.0–34.0)
MCHC: 33.5 g/dL (ref 30.0–36.0)
MCV: 93.7 fL (ref 80.0–100.0)
Monocytes Absolute: 0.7 10*3/uL (ref 0.1–1.0)
Monocytes Relative: 13 %
Neutro Abs: 3.4 10*3/uL (ref 1.7–7.7)
Neutrophils Relative %: 65 %
Platelets: 113 10*3/uL — ABNORMAL LOW (ref 150–400)
RBC: 4.14 MIL/uL — ABNORMAL LOW (ref 4.22–5.81)
RDW: 14.1 % (ref 11.5–15.5)
WBC: 5.1 10*3/uL (ref 4.0–10.5)
nRBC: 0 % (ref 0.0–0.2)

## 2021-10-05 LAB — GLUCOSE, CAPILLARY
Glucose-Capillary: 213 mg/dL — ABNORMAL HIGH (ref 70–99)
Glucose-Capillary: 304 mg/dL — ABNORMAL HIGH (ref 70–99)

## 2021-10-05 MED ORDER — ATORVASTATIN CALCIUM 20 MG PO TABS
40.0000 mg | ORAL_TABLET | Freq: Every day | ORAL | Status: DC
Start: 2021-10-05 — End: 2021-10-05

## 2021-10-05 MED ORDER — HYDROXYZINE HCL 10 MG PO TABS
10.0000 mg | ORAL_TABLET | Freq: Three times a day (TID) | ORAL | Status: DC | PRN
  Filled 2021-10-05 (×2): qty 1

## 2021-10-05 MED ORDER — KETOROLAC TROMETHAMINE 15 MG/ML IJ SOLN
15.0000 mg | Freq: Four times a day (QID) | INTRAMUSCULAR | Status: DC | PRN
  Administered 2021-10-05: 15 mg via INTRAVENOUS
  Filled 2021-10-05: qty 1

## 2021-10-05 MED ORDER — ASPIRIN 81 MG PO TBEC: 81.0000 mg | DELAYED_RELEASE_TABLET | Freq: Every day | ORAL | 0 refills | Status: DC

## 2021-10-05 MED ORDER — ASPIRIN-ACETAMINOPHEN-CAFFEINE 250-250-65 MG PO TABS: 1.0000 | ORAL_TABLET | Freq: Four times a day (QID) | ORAL | 0 refills | Status: DC | PRN

## 2021-10-05 MED ORDER — INSULIN ASPART 100 UNIT/ML IJ SOLN: 4.0000 [IU] | Freq: Three times a day (TID) | INTRAMUSCULAR | Status: DC

## 2021-10-05 MED ORDER — INSULIN GLARGINE-YFGN 100 UNIT/ML ~~LOC~~ SOLN
7.0000 [IU] | Freq: Every day | SUBCUTANEOUS | Status: DC
  Filled 2021-10-05 (×2): qty 0.07

## 2021-10-05 MED ORDER — ASPIRIN-ACETAMINOPHEN-CAFFEINE 250-250-65 MG PO TABS
1.0000 | ORAL_TABLET | Freq: Four times a day (QID) | ORAL | Status: DC | PRN
  Filled 2021-10-05: qty 2

## 2021-10-05 MED ORDER — NITROGLYCERIN 0.4 MG SL SUBL: 0.4000 mg | SUBLINGUAL_TABLET | SUBLINGUAL | 0 refills | Status: DC | PRN

## 2021-10-05 MED ORDER — SUMATRIPTAN SUCCINATE 50 MG PO TABS
100.0000 mg | ORAL_TABLET | Freq: Once | ORAL | Status: DC
  Filled 2021-10-05: qty 2

## 2021-10-05 MED ORDER — LISINOPRIL 20 MG PO TABS: 20.0000 mg | ORAL_TABLET | Freq: Every day | ORAL | Status: DC

## 2021-10-05 MED ORDER — ATORVASTATIN CALCIUM 40 MG PO TABS: 40.0000 mg | ORAL_TABLET | Freq: Every day | ORAL | 0 refills | Status: DC
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
## 2021-10-05 NOTE — Progress Notes (Signed)
Inpatient Diabetes Program Recommendations  AACE/ADA: New Consensus Statement on Inpatient Glycemic Control (2015)  Target Ranges:  Prepandial:   less than 140 mg/dL      Peak postprandial:   less than 180 mg/dL (1-2 hours)      Critically ill patients:  140 - 180 mg/dL    Latest Reference Range & Units 10/05/21 07:54 10/05/21 11:27  Glucose-Capillary 70 - 99 mg/dL 213 (H)  5 units Novolog  304 (H)    Home DM Meds: Jardiance 10 mg daily        Metformin 500 mg BID  Current Orders: Novolog Moderate Correction Scale/ SSI (0-15 units) TID AC + HS     MD- Note CBGs >200 today.  Home oral DM meds are currently on hold.  Please consider:  1. Start low dose basal insulin: Semglee 7 units QHS (0.05 units/kg)  2. Start Novolog Meal Coverage: Novolog 4 units TID with meals HOLD if pt eats <50% meals    --Will follow patient during hospitalization--  Wyn Quaker RN, MSN, CDE Diabetes Coordinator Inpatient Glycemic Control Team Team Pager: [phone] (8a-5p)
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
## 2021-10-05 NOTE — Progress Notes (Signed)
Progress Note  Patient Name: [name] Date of Encounter: 10/05/2021  Primary Cardiologist: [name]  Subjective   Lexiscan MPI yesterday showed no significant ischemia with a preserved LV systolic function.  Glucose is improved from the 600s at admission. Vitals stable. No chest pain. Has a migraine, typical for him.   Inpatient Medications    Scheduled Meds:  aspirin EC  81 mg Oral Daily   enoxaparin (LOVENOX) injection  0.5 mg/kg Subcutaneous Q24H   insulin aspart  0-15 Units Subcutaneous TID WC   insulin aspart  0-5 Units Subcutaneous QHS   isosorbide mononitrate  30 mg Oral Daily   pantoprazole (PROTONIX) IV  40 mg Intravenous Q24H   Continuous Infusions:  PRN Meds: acetaminophen, nitroGLYCERIN, ondansetron (ZOFRAN) IV   Vital Signs    Vitals:   10/04/21 2316 10/05/21 0357 10/05/21 0500 10/05/21 0753  BP: (!) 137/59 (!) 132/58  (!) 145/76  Pulse: (!) 59 (!) 59  61  Resp: 20 20    Temp: 98.5 F (36.9 C) 98.4 F (36.9 C)    TempSrc: Oral Oral    SpO2: 98% 96%  98%  Weight:   134.6 kg   Height:        Intake/Output Summary (Last 24 hours) at 10/05/2021 0849 Last data filed at 10/04/2021 1952 Gross per 24 hour  Intake 648 ml  Output --  Net 648 ml   Filed Weights   10/03/21 2217 10/05/21 0500  Weight: 134.7 kg 134.6 kg    Telemetry    SR, 60s bpm - Personally Reviewed  ECG    No new tracings - Personally Reviewed  Physical Exam   GEN: No acute distress.   Neck: No JVD. Cardiac: RRR, no murmurs, rubs, or gallops.  Respiratory: Clear to auscultation bilaterally.  GI: Soft, nontender, non-distended.   MS: No edema; No deformity. Neuro:  Alert and oriented x 3; Nonfocal.  Psych: Normal affect.  Labs    Chemistry Recent Labs  Lab 10/03/21 2219 10/04/21 0021 10/04/21 0425 10/05/21 0621  NA 132*  --   --  135  K 3.5  --   --  3.8  CL 98  --   --  101  CO2 25  --   --  29  GLUCOSE 635*  --   --  197*  BUN 10  --   --  15  CREATININE  1.46*  --  1.31* 1.36*  CALCIUM 8.7*  --   --  8.5*  PROT  --  6.9  --  6.3*  ALBUMIN  --  2.6*  --  2.4*  AST  --  60*  --  54*  ALT  --  36  --  32  ALKPHOS  --  131*  --  89  BILITOT  --  1.7*  --  2.0*  GFRNONAA 51*  --  58* 55*  ANIONGAP 9  --   --  5     Hematology Recent Labs  Lab 10/03/21 2219 10/04/21 0425 10/05/21 0621  WBC 4.4 5.3 5.1  RBC 4.46 4.12* 4.14*  HGB 14.1 13.0 13.0  HCT 41.6 39.0 38.8*  MCV 93.3 94.7 93.7  MCH 31.6 31.6 31.4  MCHC 33.9 33.3 33.5  RDW 13.9 13.9 14.1  PLT 115* 109* 113*    Cardiac EnzymesNo results for input(s): TROPONINI in the last 168 hours. No results for input(s): TROPIPOC in the last 168 hours.   BNP Recent Labs  Lab 10/04/21  7106  BNP 115.4*     DDimer No results for input(s): DDIMER in the last 168 hours.   Radiology    DG Chest 2 View  Result Date: 10/03/2021 IMPRESSION: No active cardiopulmonary disease. Electronically Signed   By: Donavan Foil M.D.   On: 10/03/2021 22:36   Cardiac Studies   Lexiscan MPI 10/04/2021:   There is no evidence for ischemia. The study is low risk.   No ST deviation was noted.   LV perfusion is normal.   Left ventricular function is normal. calculated LVEF is 58%.   LAD calcifications noted __________  Patient Profile     71 y.o. male with history of coronary artery calcification, COPD, migraine disorder, poorly controlled DM, lymphedema, obesity, and OSA not compliant with CPAP who we are seeing for chest pain and mildly elevated troponin.   Assessment & Plan    1. Coronary artery calcification with chest pain: -Currently, chest pain free -High sensitivity troponin elevation not consistent with ACS -No indication for heparin gtt -Lexiscan MPI low risk -Recent echo with normal LVSF -Transition from simvastatin to Lipitor -Continue ASA  -Stop Imdur with migraine -No plans for further cardiac testing, follow up as outpatient  2. HLD: -Escalate statin therapy as  above -Outpatient follow up  3. Migraine: -Typical presentation for him -Stop newly started Imdur -Pain management per IM  4. DM2: -Poorly controlled -Glucose > 600 at admission  -Per IM      For questions or updates, please contact [place] Please consult [email or web address] for contact info under Cardiology/STEMI.    Signed, Christell Faith, PA-C [place] Pager: [phone] 10/05/2021, 8:49 AM

## 2021-10-05 NOTE — Progress Notes (Signed)
Patient up for discharge, pt stated that he is not ready for d/c dt his BG, temp, and 10/10 migraine. MD informed will keep another night.
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
## 2021-11-06 ENCOUNTER — Ambulatory Visit: Payer: Medicaid Other | Admitting: Family

## 2021-11-14 NOTE — Progress Notes (Deleted)
Entry by mistake, please delete

## 2021-11-17 ENCOUNTER — Ambulatory Visit: Payer: Medicaid Other | Admitting: Cardiovascular Disease
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
## 2021-11-20 ENCOUNTER — Encounter: Payer: Self-pay | Admitting: Cardiovascular Disease

## 2021-12-01 ENCOUNTER — Emergency Department
Admission: EM | Admit: 2021-12-01 | Discharge: 2021-12-02 | Disposition: A | Discharge status: Home / Self Care | Payer: Medicare Other | Attending: Student in an Organized Health Care Education/Training Program | Admitting: Student in an Organized Health Care Education/Training Program

## 2021-12-01 ENCOUNTER — Emergency Department: Payer: Medicare Other

## 2021-12-01 DIAGNOSIS — R0600 Dyspnea, unspecified: Secondary | ICD-10-CM | POA: Insufficient documentation

## 2021-12-01 DIAGNOSIS — R0602 Shortness of breath: Secondary | ICD-10-CM | POA: Diagnosis present

## 2021-12-01 LAB — CBG MONITORING, ED
Glucose-Capillary: 479 mg/dL — ABNORMAL HIGH (ref 70–99)
Glucose-Capillary: 410 mg/dL — ABNORMAL HIGH (ref 70–99)

## 2021-12-01 LAB — CBC
HCT: 40.1 % (ref 39.0–52.0)
Hemoglobin: 13.5 g/dL (ref 13.0–17.0)
MCH: 31 pg (ref 26.0–34.0)
MCHC: 33.7 g/dL (ref 30.0–36.0)
MCV: 92 fL (ref 80.0–100.0)
Platelets: 99 10*3/uL — ABNORMAL LOW (ref 150–400)
RBC: 4.36 MIL/uL (ref 4.22–5.81)
RDW: 13.2 % (ref 11.5–15.5)
WBC: 4.1 10*3/uL (ref 4.0–10.5)
nRBC: 0 % (ref 0.0–0.2)

## 2021-12-01 LAB — COMPREHENSIVE METABOLIC PANEL
ALT: 25 U/L (ref 0–44)
AST: 40 U/L (ref 15–41)
Albumin: 2.8 g/dL — ABNORMAL LOW (ref 3.5–5.0)
Alkaline Phosphatase: 101 U/L (ref 38–126)
Anion gap: 10 (ref 5–15)
BUN: 21 mg/dL (ref 8–23)
CO2: 30 mmol/L (ref 22–32)
Calcium: 10.5 mg/dL — ABNORMAL HIGH (ref 8.9–10.3)
Chloride: 93 mmol/L — ABNORMAL LOW (ref 98–111)
Creatinine, Ser: 1.33 mg/dL — ABNORMAL HIGH (ref 0.61–1.24)
GFR, Estimated: 57 mL/min — ABNORMAL LOW (ref >60)
Glucose, Bld: 571 mg/dL (ref 70–99)
Potassium: 3.7 mmol/L (ref 3.5–5.1)
Sodium: 133 mmol/L — ABNORMAL LOW (ref 135–145)
Total Bilirubin: 1.2 mg/dL (ref 0.3–1.2)
Total Protein: 7.2 g/dL (ref 6.5–8.1)

## 2021-12-01 LAB — TROPONIN I (HIGH SENSITIVITY): Troponin I (High Sensitivity): 23 ng/L — ABNORMAL HIGH (ref <18)

## 2021-12-01 MED ORDER — INSULIN ASPART 100 UNIT/ML IJ SOLN
10.0000 [IU] | Freq: Once | INTRAMUSCULAR | Status: AC
  Administered 2021-12-01: 10 [IU] via SUBCUTANEOUS
  Filled 2021-12-01: qty 1

## 2021-12-01 MED ORDER — ALBUTEROL SULFATE HFA 108 (90 BASE) MCG/ACT IN AERS: 2.0000 | INHALATION_SPRAY | Freq: Four times a day (QID) | RESPIRATORY_TRACT | 2 refills | Status: DC | PRN

## 2021-12-01 MED ORDER — JARDIANCE 10 MG PO TABS: 10.0000 mg | ORAL_TABLET | Freq: Every day | ORAL | 1 refills | Status: DC

## 2021-12-01 MED ORDER — METFORMIN HCL ER 500 MG PO TB24
500.0000 mg | ORAL_TABLET | Freq: Two times a day (BID) | ORAL | 1 refills | Status: DC
Start: 2021-12-01 — End: 2022-03-10

## 2021-12-01 MED ORDER — PANTOPRAZOLE SODIUM 40 MG PO TBEC
40.0000 mg | DELAYED_RELEASE_TABLET | Freq: Once | ORAL | Status: AC
  Administered 2021-12-01: 40 mg via ORAL
  Filled 2021-12-01: qty 1

## 2021-12-01 MED ORDER — PREDNISONE 20 MG PO TABS: 40.0000 mg | ORAL_TABLET | Freq: Every day | ORAL | 0 refills | Status: AC

## 2021-12-01 MED ORDER — OMEPRAZOLE 40 MG PO CPDR: 40.0000 mg | DELAYED_RELEASE_CAPSULE | Freq: Every day | ORAL | 1 refills | Status: DC

## 2021-12-01 MED ORDER — INSULIN ASPART 100 UNIT/ML IJ SOLN
8.0000 [IU] | Freq: Once | INTRAMUSCULAR | Status: AC
  Administered 2021-12-01: 8 [IU] via SUBCUTANEOUS
  Filled 2021-12-01: qty 1

## 2021-12-01 MED ORDER — SUCRALFATE 1 GM/10ML PO SUSP: 1.0000 g | Freq: Four times a day (QID) | ORAL | 1 refills | Status: DC

## 2021-12-01 MED ORDER — LISINOPRIL 10 MG PO TABS: 30.0000 mg | ORAL_TABLET | Freq: Every day | ORAL | 1 refills | Status: DC

## 2021-12-01 MED ORDER — IPRATROPIUM-ALBUTEROL 0.5-2.5 (3) MG/3ML IN SOLN
3.0000 mL | Freq: Once | RESPIRATORY_TRACT | Status: AC
  Administered 2021-12-01: 3 mL via RESPIRATORY_TRACT
  Filled 2021-12-01: qty 3

## 2021-12-01 MED ORDER — SODIUM CHLORIDE 0.9 % IV BOLUS
500.0000 mL | Freq: Once | INTRAVENOUS | Status: AC
Start: 2021-12-01 — End: 2021-12-01
  Administered 2021-12-01: 500 mL via INTRAVENOUS
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
## 2021-12-02 LAB — TROPONIN I (HIGH SENSITIVITY): Troponin I (High Sensitivity): 21 ng/L — ABNORMAL HIGH (ref <18)
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
## 2022-01-14 NOTE — Progress Notes (Deleted)
Patient ID: HINES KLOSS, male    DOB: 1951-01-18, 71 y.o.   MRN: [id number]  HPI  Mr Altice is a 71 y/o male with a history of DM, HTN, COPD, migraines, obstructive sleep apnea and chronic heart failure.  Echo report from 04/01/21 reviewed and showed an EF of 55-60% along with mild LVH/ LAE. Echo report from 03/20/18 reviewed and showed an EF of 60-65% along with mild/moderate MR and a mildly elevated PA pressure of 42 mm Hg.   Was in the ED 12/01/21 due to shortness of breath and loose stools that started after the patient was exposed to a "bug bomb". Glucose elevated and he was given IVF and insulin. He was released.   He presents today for a follow-up visit with a chief complaint of   Past Medical History:  Diagnosis Date   Asthma    CHF (congestive heart failure) ([place])    COPD (chronic obstructive pulmonary disease) ([place])    COVID-19 03/2020   diagnosed in August 2021   Diabetes mellitus without complication ([facility])    Homelessness    Hypertension    Migraine    Obesity    Sleep apnea    Past Surgical History:  Procedure Laterality Date   CARDIAC CATHETERIZATION     CHOLECYSTECTOMY     EYE SURGERY     INNER EAR SURGERY     NOSE SURGERY     Family History  Problem Relation Age of Onset   Hypertension Mother    Heart disease Mother    Heart failure Maternal Grandmother    Social History   Tobacco Use   Smoking status: Former   Smokeless tobacco: Never  Substance Use Topics   Alcohol use: Never   Allergies  Allergen Reactions   Novocain [Procaine]    Sulfa Antibiotics Other (See Comments)     Review of Systems  Constitutional:  Positive for fatigue. Negative for appetite change.  HENT:  Negative for congestion, postnasal drip and sinus pressure.   Eyes:  Positive for visual disturbance (left eye).  Respiratory:  Positive for cough (dry) and shortness of breath (minimal although worse with humidity). Negative for chest tightness.   Cardiovascular:  Positive  for chest pain (at times). Negative for palpitations and leg swelling.  Gastrointestinal:  Positive for abdominal distention (at times). Negative for abdominal pain.  Endocrine: Negative.   Genitourinary: Negative.   Musculoskeletal:  Negative for back pain and neck pain.  Skin: Negative.   Allergic/Immunologic: Negative.   Neurological:  Positive for headaches. Negative for dizziness and light-headedness.  Hematological:  Negative for adenopathy. Does not bruise/bleed easily.  Psychiatric/Behavioral:  Positive for sleep disturbance (trouble sleeping; sleeping on 2 pillows). Negative for dysphoric mood. The patient is not nervous/anxious.      Physical Exam Vitals and nursing note reviewed.  Constitutional:      Appearance: Normal appearance.  HENT:     Head: Normocephalic and atraumatic.  Cardiovascular:     Rate and Rhythm: Normal rate and regular rhythm.  Pulmonary:     Effort: Pulmonary effort is normal. No respiratory distress.     Breath sounds: No wheezing or rales.  Abdominal:     General: There is no distension.     Palpations: Abdomen is soft.     Tenderness: There is no abdominal tenderness.  Musculoskeletal:        General: No tenderness.     Cervical back: Normal range of motion and neck supple.  Right lower leg: No edema.     Left lower leg: No edema.  Skin:    General: Skin is warm and dry.  Neurological:     General: No focal deficit present.     Mental Status: He is alert and oriented to person, place, and time.  Psychiatric:        Mood and Affect: Mood normal.        Behavior: Behavior normal.        Thought Content: Thought content normal.   Assessment & Plan:  1: Chronic heart failure with preserved ejection fraction with structural changes (LVH/LAE)- - NYHA class II - euvolemic today - not weighing daily as he says his weight has been stable; reminded to call for an overnight weight gain of >2 pounds or a weekly weight gain of >5 pounds -  weight 298.2 from last visit here 9 months ago - not adding salt to his food - had telemedicine visit with cardiology [name]) 12/02/2018 - BNP 10/04/21 was 115.4  2: HTN- - BP  - saw PCP at [facility] 12/01/21 reviewed and showed sodium 133, potassium 3.7, creatinine 1.33 and GFR 57  3: DM- - A1c 10/04/21 was 7.7% - glucose on 12/01/21 was 571 during ED visit - says that his glucose was    Patient did not bring his medications nor a list. Each medication was verbally reviewed with the patient and he was encouraged to bring the bottles to every visit to confirm accuracy of list.

## 2022-01-15 ENCOUNTER — Ambulatory Visit: Payer: Medicare Other | Admitting: Family

## 2022-01-15 ENCOUNTER — Telehealth: Payer: Self-pay | Admitting: Family

## 2022-01-15 NOTE — Telephone Encounter (Signed)
Patient did not show for his [facility] appointment on 01/15/22. Will attempt to reschedule.

## 2022-01-30 ENCOUNTER — Ambulatory Visit: Payer: Medicare Other | Attending: Family | Admitting: Family

## 2022-01-30 ENCOUNTER — Encounter: Payer: Self-pay | Admitting: Family

## 2022-01-30 VITALS — BP 130/73 | HR 62 | Resp 16 | Ht 66.0 in | Wt 277.5 lb

## 2022-01-30 DIAGNOSIS — J449 Chronic obstructive pulmonary disease, unspecified: Secondary | ICD-10-CM | POA: Insufficient documentation

## 2022-01-30 DIAGNOSIS — I110 Hypertensive heart disease with heart failure: Secondary | ICD-10-CM | POA: Insufficient documentation

## 2022-01-30 DIAGNOSIS — I10 Essential (primary) hypertension: Secondary | ICD-10-CM | POA: Diagnosis not present

## 2022-01-30 DIAGNOSIS — E119 Type 2 diabetes mellitus without complications: Secondary | ICD-10-CM | POA: Diagnosis not present

## 2022-01-30 DIAGNOSIS — I5032 Chronic diastolic (congestive) heart failure: Secondary | ICD-10-CM

## 2022-01-30 DIAGNOSIS — G4733 Obstructive sleep apnea (adult) (pediatric): Secondary | ICD-10-CM | POA: Insufficient documentation

## 2022-01-30 NOTE — Patient Instructions (Signed)
Continue weighing daily and call for an overnight weight gain of 3 pounds or more or a weekly weight gain of more than 5 pounds  If you have voicemail, please make sure your mailbox is cleaned out so that we may leave a message and please make sure to listen to any voicemails.    If you receive a satisfaction survey regarding the Heart Failure Clinic, please take the time to fill it out. This way we can continue to provide excellent care and make any changes that need to be made.     

## 2022-01-30 NOTE — Progress Notes (Signed)
[facility] - PHARMACIST COUNSELING NOTE  Guideline-Directed Medical Therapy/Evidence Based Medicine  ACE/ARB/ARNI: Lisinopril 30 mg daily Beta Blocker: None Aldosterone Antagonist: None Diuretic: Furosemide 80 mg daily SGLT2i: Empagliflozin 10 mg daily  Adherence Assessment  Do you ever forget to take your medication? '[]'$ Yes '[]'$ No  Do you ever skip doses due to side effects? '[]'$ Yes '[]'$ No  Do you have trouble affording your medicines? '[]'$ Yes '[]'$ No  Are you ever unable to pick up your medication due to transportation difficulties? '[]'$ Yes '[]'$ No  Do you ever stop taking your medications because you don't believe they are helping? '[]'$ Yes '[]'$ No  Do you check your weight daily? '[]'$ Yes '[]'$ No   Adherence strategy: memory  Barriers to obtaining medications: mobility. Patient is in the process of applying for wheelchair and home health services through Medicare.  Vital signs: HR 62, BP 130/73, weight (pounds) 277 lb ECHO: Date 03/31/2021, EF 55-60%, notes mild left ventricular hypertrophy     Latest Ref Rng & Units 12/01/2021    7:33 PM 10/05/2021    6:21 AM 10/04/2021    4:25 AM  BMP  Glucose 70 - 99 mg/dL 571   197     BUN 8 - 23 mg/dL 21   15     Creatinine 0.61 - 1.24 mg/dL 1.33   1.36   1.31    Sodium 135 - 145 mmol/L 133   135     Potassium 3.5 - 5.1 mmol/L 3.7   3.8     Chloride 98 - 111 mmol/L 93   101     CO2 22 - 32 mmol/L 30   29     Calcium 8.9 - 10.3 mg/dL 10.5   8.5       Past Medical History:  Diagnosis Date   Asthma    CHF (congestive heart failure) ([facility])    COPD (chronic obstructive pulmonary disease) ([place])    COVID-19 03/2020   diagnosed in August 2021   Diabetes mellitus without complication ([place])    Homelessness    Hypertension    Migraine    Obesity    Sleep apnea     ASSESSMENT 71 year old male who presents to the HF clinic for follow up. PMH relevant to T2DM, HTN, COPD, migraines, OSA, and HFpEF. On GDMT of  lisinopril and empagliflozin. Taking iron infrequently due to gastrointestinal intolerance. Not taking atorvastatin and omeprazole due to concurrent treatment of hepatitis with Epclusa. Has trouble remembering afternoon doses of medications. Blood pressure at goal during clinic visit today 6/6.  Recent ED Visit (past 6 months): Date - 12/01/2021, [facility] - dyspnea  PLAN Encouraged patient to complete application for home health assistance.  Advised patient to use a pill box to assist in afternoon doses as well as organization of medications. Suggest obtaining updated BMP  Time spent: 15 minutes  Wynelle Cleveland, PharmD Pharmacy Resident  01/30/2022 2:29 PM  Current Outpatient Medications:    albuterol (PROVENTIL) (2.5 MG/3ML) 0.083% nebulizer solution, Take 3 mLs by nebulization every 4 (four) hours as needed., Disp: , Rfl:    albuterol (VENTOLIN HFA) 108 (90 Base) MCG/ACT inhaler, Inhale 2 puffs into the lungs every 6 (six) hours as needed for wheezing or shortness of breath., Disp: 1 each, Rfl: 2   ascorbic acid (VITAMIN C) 500 MG tablet, Take 1 tablet (500 mg total) by mouth daily., Disp: , Rfl:    aspirin EC 81 MG EC tablet, Take 1 tablet (81 mg total)  by mouth daily. Swallow whole. (Patient not taking: Reported on 01/30/2022), Disp: 30 tablet, Rfl: 0   atorvastatin (LIPITOR) 40 MG tablet, Take 1 tablet (40 mg total) by mouth daily. (Patient not taking: Reported on 01/30/2022), Disp: 30 tablet, Rfl: 0   buprenorphine-naloxone (SUBOXONE) 2-0.5 mg SUBL SL tablet, Place 1 tablet under the tongue in the morning and at bedtime. [name], Disp: , Rfl:    cetirizine (ZYRTEC ALLERGY) 10 MG tablet, Take 1 tablet (10 mg total) by mouth daily., Disp: 30 tablet, Rfl: 0   cyclobenzaprine (FLEXERIL) 5 MG tablet, Take 5 mg by mouth at bedtime as needed., Disp: , Rfl:    EPINEPHrine 0.3 mg/0.3 mL IJ SOAJ injection, Inject into the muscle., Disp: , Rfl:    fluticasone (FLONASE) 50 MCG/ACT nasal spray, Place 1  spray into both nostrils daily., Disp: , Rfl:    furosemide (LASIX) 80 MG tablet, Take 80 mg by mouth daily., Disp: , Rfl:    glucosamine-chondroitin 500-400 MG tablet, Take 1 tablet by mouth 3 (three) times daily., Disp: , Rfl:    hydrOXYzine (ATARAX/VISTARIL) 10 MG tablet, Take 10 mg by mouth 3 (three) times daily as needed for itching or anxiety., Disp: , Rfl:    JARDIANCE 10 MG TABS tablet, Take 1 tablet (10 mg total) by mouth daily., Disp: 30 tablet, Rfl: 1   lisinopril (ZESTRIL) 10 MG tablet, Take 3 tablets (30 mg total) by mouth daily., Disp: 30 tablet, Rfl: 1   metFORMIN (GLUCOPHAGE-XR) 500 MG 24 hr tablet, Take 1 tablet (500 mg total) by mouth 2 (two) times daily., Disp: 60 tablet, Rfl: 1   Multiple Vitamin (MULTIVITAMIN WITH MINERALS) TABS tablet, Take 1 tablet by mouth daily., Disp: , Rfl:    nitroGLYCERIN (NITROSTAT) 0.4 MG SL tablet, Place 1 tablet (0.4 mg total) under the tongue every 5 (five) minutes x 3 doses as needed for chest pain., Disp: 30 tablet, Rfl: 0   omeprazole (PRILOSEC) 40 MG capsule, Take 1 capsule (40 mg total) by mouth daily. (Patient not taking: Reported on 01/30/2022), Disp: 60 capsule, Rfl: 1   ondansetron (ZOFRAN-ODT) 4 MG disintegrating tablet, Take 4 mg by mouth every 8 (eight) hours as needed for nausea or vomiting., Disp: , Rfl:    PARoxetine HCl (PAXIL PO), Take 10 mg by mouth daily., Disp: , Rfl:    Sofosbuvir-Velpatasvir 400-100 MG TABS, Take 1 tablet by mouth daily., Disp: , Rfl:    sucralfate (CARAFATE) 1 GM/10ML suspension, Take 10 mLs (1 g total) by mouth 4 (four) times daily., Disp: 420 mL, Rfl: 1   tamsulosin (FLOMAX) 0.4 MG CAPS capsule, Take 0.4 mg by mouth daily., Disp: , Rfl:    COUNSELING POINTS/CLINICAL PEARLS    DRUGS TO CAUTION IN HEART FAILURE  Drug or Class Mechanism  Analgesics NSAIDs COX-2 inhibitors Glucocorticoids  Sodium and water retention, increased systemic vascular resistance, decreased response to diuretics   Diabetes  Medications Metformin Thiazolidinediones Rosiglitazone (Avandia) Pioglitazone (Actos) DPP4 Inhibitors Saxagliptin (Onglyza) Sitagliptin (Januvia)   Lactic acidosis Possible calcium channel blockade   Unknown  Antiarrhythmics Class I  Flecainide Disopyramide Class III Sotalol Other Dronedarone  Negative inotrope, proarrhythmic   Proarrhythmic, beta blockade  Negative inotrope  Antihypertensives Alpha Blockers Doxazosin Calcium Channel Blockers Diltiazem Verapamil Nifedipine Central Alpha Adrenergics Moxonidine Peripheral Vasodilators Minoxidil  Increases renin and aldosterone  Negative inotrope    Possible sympathetic withdrawal  Unknown  Anti-infective Itraconazole Amphotericin B  Negative inotrope Unknown  Hematologic Anagrelide Cilostazol   Possible  inhibition of PD IV Inhibition of PD III causing arrhythmias  Neurologic/Psychiatric Stimulants Anti-Seizure Drugs Carbamazepine Pregabalin Antidepressants Tricyclics Citalopram Parkinsons Bromocriptine Pergolide Pramipexole Antipsychotics Clozapine Antimigraine Ergotamine Methysergide Appetite suppressants Bipolar Lithium  Peripheral alpha and beta agonist activity  Negative inotrope and chronotrope Calcium channel blockade  Negative inotrope, proarrhythmic Dose-dependent QT prolongation  Excessive serotonin activity/valvular damage Excessive serotonin activity/valvular damage Unknown  IgE mediated hypersensitivy, calcium channel blockade  Excessive serotonin activity/valvular damage Excessive serotonin activity/valvular damage Valvular damage  Direct myofibrillar degeneration, adrenergic stimulation  Antimalarials Chloroquine Hydroxychloroquine Intracellular inhibition of lysosomal enzymes  Urologic Agents Alpha Blockers Doxazosin Prazosin Tamsulosin Terazosin  Increased renin and aldosterone  Adapted from Page Carleene Overlie, et al. "Drugs That May Cause or Exacerbate  Heart Failure: A Scientific Statement from the American Heart  Association." Circulation 2016; 134:e32-e69. DOI: 10.1161/CIR.0000000000000426   MEDICATION ADHERENCES TIPS AND STRATEGIES Taking medication as prescribed improves patient outcomes in heart failure (reduces hospitalizations, improves symptoms, increases survival) Side effects of medications can be managed by decreasing doses, switching agents, stopping drugs, or adding additional therapy. Please let someone in the [facility] know if you have having bothersome side effects so we can modify your regimen. Do not alter your medication regimen without talking to [place].  Medication reminders can help patients remember to take drugs on time. If you are missing or forgetting doses you can try linking behaviors, using pill boxes, or an electronic reminder like an alarm on your phone or an app. Some people can also get automated phone calls as medication reminders.

## 2022-01-30 NOTE — Progress Notes (Signed)
Patient ID: Drew CORPUS, male    DOB: 1951-04-11, 71 y.o.   MRN: [id number]  HPI  Mr Forcier is a 71 y/o male with a history of DM, HTN, COPD, migraines, obstructive sleep apnea and chronic heart failure.  Echo report from 04/01/21 reviewed and showed an EF of 55-60% along with mild LVH/ LAE. Echo report from 03/20/18 reviewed and showed an EF of 60-65% along with mild/moderate MR and a mildly elevated PA pressure of 42 mm Hg.   Was in the ED 12/01/21 due to shortness of breath and loose stools that started after the patient was exposed to a "bug bomb". Glucose elevated and he was given IVF and insulin. He was released.   He presents today for a follow-up visit with a chief complaint of minimal fatigue upon moderate exertion. Describes this as chronic in nature. Has associated cough, shortness of breath, difficulty sleeping, headaches and chronic pain along with this. Denies any dizziness, abdominal distention, palpitations, pedal edema, chest pain or weight gain.   Says that he's trying to get home health nursing through [facility] insurance.   Past Medical History:  Diagnosis Date   Asthma    CHF (congestive heart failure) ([facility])    COPD (chronic obstructive pulmonary disease) ([place])    COVID-19 03/2020   diagnosed in August 2021   Diabetes mellitus without complication ([place])    Homelessness    Hypertension    Migraine    Obesity    Sleep apnea    Past Surgical History:  Procedure Laterality Date   CARDIAC CATHETERIZATION     CHOLECYSTECTOMY     EYE SURGERY     INNER EAR SURGERY     NOSE SURGERY     Family History  Problem Relation Age of Onset   Hypertension Mother    Heart disease Mother    Heart failure Maternal Grandmother    Social History   Tobacco Use   Smoking status: Former   Smokeless tobacco: Never  Substance Use Topics   Alcohol use: Never   Allergies  Allergen Reactions   Novocain [Procaine]    Sulfa Antibiotics Other (See Comments)   Prior to Admission  medications   Medication Sig Start Date End Date Taking? Authorizing Provider  albuterol (PROVENTIL) (2.5 MG/3ML) 0.083% nebulizer solution Take 3 mLs by nebulization every 4 (four) hours as needed. 05/06/21  Yes [provider]  albuterol (VENTOLIN HFA) 108 (90 Base) MCG/ACT inhaler Inhale 2 puffs into the lungs every 6 (six) hours as needed for wheezing or shortness of breath. 12/01/21  Yes Merlyn Lot, MD  ascorbic acid (VITAMIN C) 500 MG tablet Take 1 tablet (500 mg total) by mouth daily. 04/20/20  Yes Samuella Cota, MD  buprenorphine-naloxone (SUBOXONE) 2-0.5 mg SUBL SL tablet Place 1 tablet under the tongue in the morning and at bedtime. [name]   Yes [provider]  cyclobenzaprine (FLEXERIL) 5 MG tablet Take 5 mg by mouth at bedtime as needed. 07/31/21  Yes [provider]  EPINEPHrine 0.3 mg/0.3 mL IJ SOAJ injection Inject into the muscle. 02/25/21  Yes [provider]  fluticasone (FLONASE) 50 MCG/ACT nasal spray Place 1 spray into both nostrils daily. 05/06/21  Yes [provider]  furosemide (LASIX) 80 MG tablet Take 80 mg by mouth daily.   Yes [provider]  glucosamine-chondroitin 500-400 MG tablet Take 1 tablet by mouth 3 (three) times daily.   Yes [provider]  hydrOXYzine (ATARAX/VISTARIL) 10 MG  tablet Take 10 mg by mouth 3 (three) times daily as needed for itching or anxiety.   Yes [provider]  JARDIANCE 10 MG TABS tablet Take 1 tablet (10 mg total) by mouth daily. 12/01/21  Yes Merlyn Lot, MD  lisinopril (ZESTRIL) 10 MG tablet Take 3 tablets (30 mg total) by mouth daily. 12/01/21  Yes Merlyn Lot, MD  metFORMIN (GLUCOPHAGE-XR) 500 MG 24 hr tablet Take 1 tablet (500 mg total) by mouth 2 (two) times daily. 12/01/21  Yes Merlyn Lot, MD  Multiple Vitamin (MULTIVITAMIN WITH MINERALS) TABS tablet Take 1 tablet by mouth daily.   Yes [provider]  nitroGLYCERIN (NITROSTAT)  0.4 MG SL tablet Place 1 tablet (0.4 mg total) under the tongue every 5 (five) minutes x 3 doses as needed for chest pain. 10/05/21  Yes Aline August, MD  ondansetron (ZOFRAN-ODT) 4 MG disintegrating tablet Take 4 mg by mouth every 8 (eight) hours as needed for nausea or vomiting.   Yes [provider]  PARoxetine HCl (PAXIL PO) Take 10 mg by mouth daily.   Yes [provider]  Sofosbuvir-Velpatasvir 400-100 MG TABS Take 1 tablet by mouth daily. 11/13/21  Yes [provider]  sucralfate (CARAFATE) 1 GM/10ML suspension Take 10 mLs (1 g total) by mouth 4 (four) times daily. 12/01/21  Yes Merlyn Lot, MD  tamsulosin (FLOMAX) 0.4 MG CAPS capsule Take 0.4 mg by mouth daily.   Yes [provider]  aspirin EC 81 MG EC tablet Take 1 tablet (81 mg total) by mouth daily. Swallow whole. Patient not taking: Reported on 01/30/2022 10/06/21   Aline August, MD  atorvastatin (LIPITOR) 40 MG tablet Take 1 tablet (40 mg total) by mouth daily. Patient not taking: Reported on 01/30/2022 10/05/21   Aline August, MD  cetirizine (ZYRTEC ALLERGY) 10 MG tablet Take 1 tablet (10 mg total) by mouth daily. 07/04/20   Blake Divine, MD  omeprazole (PRILOSEC) 40 MG capsule Take 1 capsule (40 mg total) by mouth daily. Patient not taking: Reported on 01/30/2022 12/01/21   Merlyn Lot, MD   Review of Systems  Constitutional:  Positive for fatigue. Negative for appetite change.  HENT:  Negative for congestion, postnasal drip and sinus pressure.   Eyes:  Positive for visual disturbance (left eye).  Respiratory:  Positive for cough (dry) and shortness of breath (minimal). Negative for chest tightness.   Cardiovascular:  Negative for chest pain, palpitations and leg swelling.  Gastrointestinal:  Negative for abdominal distention and abdominal pain.  Endocrine: Negative.   Genitourinary: Negative.   Musculoskeletal:  Positive for arthralgias ("joints") and back pain. Negative for neck pain.   Skin: Negative.   Allergic/Immunologic: Negative.   Neurological:  Positive for headaches. Negative for dizziness and light-headedness.  Hematological:  Negative for adenopathy. Does not bruise/bleed easily.  Psychiatric/Behavioral:  Positive for sleep disturbance (trouble sleeping; sleeping on 2 pillows). Negative for dysphoric mood. The patient is not nervous/anxious.    Vitals:   01/30/22 1256  BP: 130/73  Pulse: 62  Resp: 16  SpO2: 99%  Weight: 277 lb 8 oz (125.9 kg)  Height: '5\' 6"'$  (1.676 m)   Wt Readings from Last 3 Encounters:  01/30/22 277 lb 8 oz (125.9 kg)  12/01/21 295 lb (133.8 kg)  10/05/21 296 lb 11.8 oz (134.6 kg)   Lab Results  Component Value Date   CREATININE 1.33 (H) 12/01/2021   CREATININE 1.36 (H) 10/05/2021   CREATININE 1.31 (H) 10/04/2021   Physical Exam Vitals and  nursing note reviewed.  Constitutional:      Appearance: Normal appearance.  HENT:     Head: Normocephalic and atraumatic.  Cardiovascular:     Rate and Rhythm: Normal rate and regular rhythm.  Pulmonary:     Effort: Pulmonary effort is normal. No respiratory distress.     Breath sounds: No wheezing or rales.  Abdominal:     General: There is no distension.     Palpations: Abdomen is soft.     Tenderness: There is no abdominal tenderness.  Musculoskeletal:        General: No tenderness.     Cervical back: Normal range of motion and neck supple.     Right lower leg: No edema.     Left lower leg: No edema.  Skin:    General: Skin is warm and dry.  Neurological:     General: No focal deficit present.     Mental Status: He is alert and oriented to person, place, and time.  Psychiatric:        Mood and Affect: Mood normal.        Behavior: Behavior normal.        Thought Content: Thought content normal.   Assessment & Plan:  1: Chronic heart failure with preserved ejection fraction with structural changes (LVH/LAE)- - NYHA class II - euvolemic today - weighing daily; reminded  to call for an overnight weight gain of >2 pounds or a weekly weight gain of >5 pounds - weight down 21 pounds from last visit here 9 months ago; says that he's eating well but has been trying to eat healthier - not adding salt to his food - had telemedicine visit with cardiology [name]) 12/02/2018 - had previously participated in paramedicine program; has had numerous missed and rescheduled HF Clinic appointments; he is agreeable to resumption of this service so paramedic was informed - BNP 10/04/21 was 115.4 - PharmD reconciled medications with the patient   2: HTN- - BP looks good today (130/73) - sees PCP at [facility] 12/01/21 reviewed and showed sodium 133, potassium 3.7, creatinine 1.33 and GFR 57  3: DM- - A1c 10/04/21 was 7.7% - glucose on 12/01/21 was 571 during ED visit - says that his glucose today at home was 123   Patient did not bring his medications nor a list. Each medication was verbally reviewed with the patient and he was encouraged to bring the bottles to every visit to confirm accuracy of list.  Return in 3 months, sooner if needed.

## 2022-02-20 ENCOUNTER — Other Ambulatory Visit (HOSPITAL_COMMUNITY): Payer: Self-pay

## 2022-02-20 ENCOUNTER — Encounter (HOSPITAL_COMMUNITY): Payer: Self-pay

## 2022-02-21 ENCOUNTER — Encounter (HOSPITAL_COMMUNITY): Payer: Self-pay

## 2022-02-21 NOTE — Progress Notes (Signed)
Today had a coworker do a home visit with [name].  [name] was on the program a few years ago and recently [place] with HF clinic asked if I would check on him.  He stats being evicted on Friday and has no place to go.  He has appt with social worker on Thursday.  He has all his medications at this time.  He denies any problems such as chest pain, headaches or dizziness.  He is weighing daily.  Lungs are clear.  He states his edema is not bad today.  He is aware of up coming appts.  He has everything for daily living right now.  He walks where he needs to go, working on getting a scooter to grocery shop.  He uses the county transportation to get to his appts.  Will continue to visit for heart failure, diet and medication management.   [place] [phone]

## 2022-02-24 DIAGNOSIS — B888 Other specified infestations: Secondary | ICD-10-CM

## 2022-02-24 HISTORY — DX: Other specified infestations: B88.8

## 2022-03-05 ENCOUNTER — Other Ambulatory Visit: Payer: Self-pay

## 2022-03-05 ENCOUNTER — Emergency Department
Admission: EM | Admit: 2022-03-05 | Discharge: 2022-03-06 | Disposition: A | Discharge status: Home / Self Care | Payer: Medicare Other | Attending: Student in an Organized Health Care Education/Training Program | Admitting: Student in an Organized Health Care Education/Training Program

## 2022-03-05 ENCOUNTER — Emergency Department: Payer: Medicare Other

## 2022-03-05 DIAGNOSIS — Z5902 Unsheltered homelessness: Secondary | ICD-10-CM

## 2022-03-05 DIAGNOSIS — Z043 Encounter for examination and observation following other accident: Secondary | ICD-10-CM | POA: Diagnosis present

## 2022-03-05 DIAGNOSIS — F4323 Adjustment disorder with mixed anxiety and depressed mood: Secondary | ICD-10-CM | POA: Diagnosis not present

## 2022-03-05 DIAGNOSIS — J449 Chronic obstructive pulmonary disease, unspecified: Secondary | ICD-10-CM | POA: Diagnosis present

## 2022-03-05 DIAGNOSIS — Z5900 Homelessness unspecified: Secondary | ICD-10-CM | POA: Diagnosis not present

## 2022-03-05 DIAGNOSIS — Z20822 Contact with and (suspected) exposure to covid-19: Secondary | ICD-10-CM | POA: Diagnosis not present

## 2022-03-05 DIAGNOSIS — Z8619 Personal history of other infectious and parasitic diseases: Secondary | ICD-10-CM | POA: Diagnosis present

## 2022-03-05 DIAGNOSIS — M79604 Pain in right leg: Secondary | ICD-10-CM | POA: Diagnosis present

## 2022-03-05 DIAGNOSIS — E1165 Type 2 diabetes mellitus with hyperglycemia: Secondary | ICD-10-CM | POA: Diagnosis present

## 2022-03-05 DIAGNOSIS — G8929 Other chronic pain: Secondary | ICD-10-CM | POA: Diagnosis not present

## 2022-03-05 DIAGNOSIS — M25562 Pain in left knee: Secondary | ICD-10-CM | POA: Insufficient documentation

## 2022-03-05 LAB — CBC WITH DIFFERENTIAL/PLATELET
Abs Immature Granulocytes: 0.02 10*3/uL (ref 0.00–0.07)
Basophils Absolute: 0.1 10*3/uL (ref 0.0–0.1)
Basophils Relative: 1 %
Eosinophils Absolute: 0.2 10*3/uL (ref 0.0–0.5)
Eosinophils Relative: 5 %
HCT: 35.6 % — ABNORMAL LOW (ref 39.0–52.0)
Hemoglobin: 11.8 g/dL — ABNORMAL LOW (ref 13.0–17.0)
Immature Granulocytes: 1 %
Lymphocytes Relative: 25 %
Lymphs Abs: 1.1 10*3/uL (ref 0.7–4.0)
MCH: 31.1 pg (ref 26.0–34.0)
MCHC: 33.1 g/dL (ref 30.0–36.0)
MCV: 93.9 fL (ref 80.0–100.0)
Monocytes Absolute: 0.6 10*3/uL (ref 0.1–1.0)
Monocytes Relative: 14 %
Neutro Abs: 2.3 10*3/uL (ref 1.7–7.7)
Neutrophils Relative %: 54 %
Platelets: 106 10*3/uL — ABNORMAL LOW (ref 150–400)
RBC: 3.79 MIL/uL — ABNORMAL LOW (ref 4.22–5.81)
RDW: 14.5 % (ref 11.5–15.5)
WBC: 4.3 10*3/uL (ref 4.0–10.5)
nRBC: 0 % (ref 0.0–0.2)

## 2022-03-05 LAB — URINALYSIS, ROUTINE W REFLEX MICROSCOPIC
Bilirubin Urine: NEGATIVE
Glucose, UA: NEGATIVE mg/dL
Hgb urine dipstick: NEGATIVE
Ketones, ur: 5 mg/dL — AB
Leukocytes,Ua: NEGATIVE
Nitrite: NEGATIVE
Protein, ur: 100 mg/dL — AB
Specific Gravity, Urine: 1.024 (ref 1.005–1.030)
pH: 5 (ref 5.0–8.0)

## 2022-03-05 LAB — URINE DRUG SCREEN, QUALITATIVE (ARMC ONLY)
Amphetamines, Ur Screen: NOT DETECTED
Barbiturates, Ur Screen: NOT DETECTED
Benzodiazepine, Ur Scrn: NOT DETECTED
Cannabinoid 50 Ng, Ur ~~LOC~~: NOT DETECTED
Cocaine Metabolite,Ur ~~LOC~~: NOT DETECTED
MDMA (Ecstasy)Ur Screen: NOT DETECTED
Methadone Scn, Ur: NOT DETECTED
Opiate, Ur Screen: NOT DETECTED
Phencyclidine (PCP) Ur S: NOT DETECTED
Tricyclic, Ur Screen: POSITIVE — AB

## 2022-03-05 LAB — COMPREHENSIVE METABOLIC PANEL
ALT: 27 U/L (ref 0–44)
AST: 45 U/L — ABNORMAL HIGH (ref 15–41)
Albumin: 2.8 g/dL — ABNORMAL LOW (ref 3.5–5.0)
Alkaline Phosphatase: 65 U/L (ref 38–126)
Anion gap: 6 (ref 5–15)
BUN: 11 mg/dL (ref 8–23)
CO2: 24 mmol/L (ref 22–32)
Calcium: 8.9 mg/dL (ref 8.9–10.3)
Chloride: 110 mmol/L (ref 98–111)
Creatinine, Ser: 1.04 mg/dL (ref 0.61–1.24)
GFR, Estimated: >60 mL/min (ref >60)
Glucose, Bld: 88 mg/dL (ref 70–99)
Potassium: 3.5 mmol/L (ref 3.5–5.1)
Sodium: 140 mmol/L (ref 135–145)
Total Bilirubin: 2.3 mg/dL — ABNORMAL HIGH (ref 0.3–1.2)
Total Protein: 6.4 g/dL — ABNORMAL LOW (ref 6.5–8.1)

## 2022-03-05 LAB — RESP PANEL BY RT-PCR (FLU A&B, COVID) ARPGX2
Influenza A by PCR: NEGATIVE
Influenza B by PCR: NEGATIVE
SARS Coronavirus 2 by RT PCR: NEGATIVE

## 2022-03-05 LAB — TROPONIN I (HIGH SENSITIVITY): Troponin I (High Sensitivity): 14 ng/L (ref <18)
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
## 2022-03-05 NOTE — ED Triage Notes (Addendum)
Pt here for left knee pain after tripping and falling. Pt states 10/10 pain. Pt states he also wishes to speak with social workers regarding PTSD.  Per EMS pt has bedbugs and several were seen on pt.   This RN has not seen any at this time.

## 2022-03-05 NOTE — ED Provider Triage Note (Signed)
Emergency Medicine Provider Triage Evaluation Note  [name] , a 71 y.o. male  was evaluated in triage.  Pt complains of left knee pain after a trip and fall. [place] to bear weight. No meds in a couple of days. Homeless. +bed bugs per EMS. Can't bear weight    Review of Systems  Positive: Knee pain Negative: paresthesias  Physical Exam  There were no vitals taken for this visit. Gen:   Awake, no distress   Resp:  Normal effort  MSK:   Left knee pain, extensor mechanism intact Other:    Medical Decision Making  Medically screening exam initiated at 3:59 PM.  Appropriate orders placed.  [name] was informed that the remainder of the evaluation will be completed by another provider, this initial triage assessment does not replace that evaluation, and the importance of remaining in the ED until their evaluation is complete.     Marquette Old, PA-C 03/05/22 1617
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
## 2022-03-05 NOTE — ED Notes (Signed)
Multiple bed bugs noted on patient. Patient changed in to paper scrubs at this time. Patient's clothes double bagged and belongings secured.

## 2022-03-06 DIAGNOSIS — F4323 Adjustment disorder with mixed anxiety and depressed mood: Secondary | ICD-10-CM | POA: Diagnosis not present

## 2022-03-06 LAB — TROPONIN I (HIGH SENSITIVITY): Troponin I (High Sensitivity): 15 ng/L (ref <18)

## 2022-03-06 MED ORDER — OXYCODONE HCL 5 MG PO TABS
5.0000 mg | ORAL_TABLET | Freq: Once | ORAL | Status: AC
  Administered 2022-03-06: 5 mg via ORAL
  Filled 2022-03-06: qty 1

## 2022-03-06 MED ORDER — ACETAMINOPHEN 500 MG PO TABS
1000.0000 mg | ORAL_TABLET | Freq: Once | ORAL | Status: AC
  Administered 2022-03-06: 1000 mg via ORAL
  Filled 2022-03-06: qty 2

## 2022-03-06 NOTE — Discharge Instructions (Signed)
Use Tylenol for pain and fevers.  Up to 1000 mg per dose, up to 4 times per day.  Do not take more than 4000 mg of Tylenol/acetaminophen within 24 hours..  

## 2022-03-10 ENCOUNTER — Other Ambulatory Visit: Payer: Self-pay

## 2022-03-10 ENCOUNTER — Emergency Department
Admission: EM | Admit: 2022-03-10 | Discharge: 2022-03-10 | Disposition: A | Discharge status: Home / Self Care | Payer: Medicare Other | Attending: Emergency Medicine | Admitting: Emergency Medicine

## 2022-03-10 ENCOUNTER — Emergency Department: Payer: Medicare Other

## 2022-03-10 DIAGNOSIS — J449 Chronic obstructive pulmonary disease, unspecified: Secondary | ICD-10-CM | POA: Diagnosis not present

## 2022-03-10 DIAGNOSIS — D72829 Elevated white blood cell count, unspecified: Secondary | ICD-10-CM | POA: Diagnosis not present

## 2022-03-10 DIAGNOSIS — I110 Hypertensive heart disease with heart failure: Secondary | ICD-10-CM | POA: Insufficient documentation

## 2022-03-10 DIAGNOSIS — I509 Heart failure, unspecified: Secondary | ICD-10-CM | POA: Diagnosis not present

## 2022-03-10 DIAGNOSIS — E119 Type 2 diabetes mellitus without complications: Secondary | ICD-10-CM | POA: Insufficient documentation

## 2022-03-10 DIAGNOSIS — R0789 Other chest pain: Secondary | ICD-10-CM | POA: Diagnosis present

## 2022-03-10 DIAGNOSIS — D649 Anemia, unspecified: Secondary | ICD-10-CM | POA: Diagnosis not present

## 2022-03-10 DIAGNOSIS — R519 Headache, unspecified: Secondary | ICD-10-CM | POA: Diagnosis not present

## 2022-03-10 LAB — URINALYSIS, ROUTINE W REFLEX MICROSCOPIC
Bilirubin Urine: NEGATIVE
Glucose, UA: NEGATIVE mg/dL
Hgb urine dipstick: NEGATIVE
Ketones, ur: 5 mg/dL — AB
Leukocytes,Ua: NEGATIVE
Nitrite: NEGATIVE
Protein, ur: 100 mg/dL — AB
Specific Gravity, Urine: 1.026 (ref 1.005–1.030)
pH: 5 (ref 5.0–8.0)

## 2022-03-10 LAB — CBC WITH DIFFERENTIAL/PLATELET
Abs Immature Granulocytes: 0.01 10*3/uL (ref 0.00–0.07)
Basophils Absolute: 0.1 10*3/uL (ref 0.0–0.1)
Basophils Relative: 2 %
Eosinophils Absolute: 0.2 10*3/uL (ref 0.0–0.5)
Eosinophils Relative: 4 %
HCT: 32.9 % — ABNORMAL LOW (ref 39.0–52.0)
Hemoglobin: 10.8 g/dL — ABNORMAL LOW (ref 13.0–17.0)
Immature Granulocytes: 0 %
Lymphocytes Relative: 25 %
Lymphs Abs: 0.9 10*3/uL (ref 0.7–4.0)
MCH: 30.5 pg (ref 26.0–34.0)
MCHC: 32.8 g/dL (ref 30.0–36.0)
MCV: 92.9 fL (ref 80.0–100.0)
Monocytes Absolute: 0.6 10*3/uL (ref 0.1–1.0)
Monocytes Relative: 17 %
Neutro Abs: 1.9 10*3/uL (ref 1.7–7.7)
Neutrophils Relative %: 52 %
Platelets: 108 10*3/uL — ABNORMAL LOW (ref 150–400)
RBC: 3.54 MIL/uL — ABNORMAL LOW (ref 4.22–5.81)
RDW: 14.7 % (ref 11.5–15.5)
WBC: 3.6 10*3/uL — ABNORMAL LOW (ref 4.0–10.5)
nRBC: 0 % (ref 0.0–0.2)

## 2022-03-10 LAB — LIPASE, BLOOD: Lipase: 35 U/L (ref 11–51)

## 2022-03-10 LAB — COMPREHENSIVE METABOLIC PANEL
ALT: 36 U/L (ref 0–44)
AST: 69 U/L — ABNORMAL HIGH (ref 15–41)
Albumin: 2.8 g/dL — ABNORMAL LOW (ref 3.5–5.0)
Alkaline Phosphatase: 75 U/L (ref 38–126)
Anion gap: 4 — ABNORMAL LOW (ref 5–15)
BUN: 11 mg/dL (ref 8–23)
CO2: 23 mmol/L (ref 22–32)
Calcium: 9.3 mg/dL (ref 8.9–10.3)
Chloride: 110 mmol/L (ref 98–111)
Creatinine, Ser: 1.22 mg/dL (ref 0.61–1.24)
GFR, Estimated: >60 mL/min (ref >60)
Glucose, Bld: 103 mg/dL — ABNORMAL HIGH (ref 70–99)
Potassium: 3.5 mmol/L (ref 3.5–5.1)
Sodium: 137 mmol/L (ref 135–145)
Total Bilirubin: 1.8 mg/dL — ABNORMAL HIGH (ref 0.3–1.2)
Total Protein: 6.5 g/dL (ref 6.5–8.1)

## 2022-03-10 LAB — TROPONIN I (HIGH SENSITIVITY)
Troponin I (High Sensitivity): 14 ng/L (ref <18)
Troponin I (High Sensitivity): 14 ng/L (ref <18)

## 2022-03-10 LAB — BRAIN NATRIURETIC PEPTIDE: B Natriuretic Peptide: 126.1 pg/mL — ABNORMAL HIGH (ref 0.0–100.0)

## 2022-03-10 MED ORDER — MORPHINE SULFATE (PF) 4 MG/ML IV SOLN
4.0000 mg | Freq: Once | INTRAVENOUS | Status: AC
  Administered 2022-03-10: 4 mg via INTRAVENOUS
  Filled 2022-03-10: qty 1

## 2022-03-10 MED ORDER — ACETAMINOPHEN 500 MG PO TABS
1000.0000 mg | ORAL_TABLET | Freq: Once | ORAL | Status: AC
  Administered 2022-03-10: 1000 mg via ORAL
  Filled 2022-03-10: qty 2

## 2022-03-10 MED ORDER — FUROSEMIDE 80 MG PO TABS: 80.0000 mg | ORAL_TABLET | Freq: Every day | ORAL | 0 refills | Status: DC

## 2022-03-10 MED ORDER — ALBUTEROL SULFATE (2.5 MG/3ML) 0.083% IN NEBU: 3.0000 mL | INHALATION_SOLUTION | RESPIRATORY_TRACT | 0 refills | Status: DC | PRN

## 2022-03-10 MED ORDER — ALBUTEROL SULFATE HFA 108 (90 BASE) MCG/ACT IN AERS: 2.0000 | INHALATION_SPRAY | Freq: Four times a day (QID) | RESPIRATORY_TRACT | 2 refills | Status: DC | PRN

## 2022-03-10 MED ORDER — JARDIANCE 10 MG PO TABS: 10.0000 mg | ORAL_TABLET | Freq: Every day | ORAL | 0 refills | Status: DC

## 2022-03-10 MED ORDER — METFORMIN HCL ER 500 MG PO TB24: 500.0000 mg | ORAL_TABLET | Freq: Every day | ORAL | 0 refills | Status: DC

## 2022-03-10 MED ORDER — LISINOPRIL 10 MG PO TABS: 30.0000 mg | ORAL_TABLET | Freq: Every day | ORAL | 0 refills | Status: DC

## 2022-03-10 MED ORDER — OMEPRAZOLE 40 MG PO CPDR: 40.0000 mg | DELAYED_RELEASE_CAPSULE | Freq: Every day | ORAL | 0 refills | Status: DC

## 2022-03-10 MED ORDER — ONDANSETRON HCL 4 MG/2ML IJ SOLN
4.0000 mg | Freq: Once | INTRAMUSCULAR | Status: AC
  Administered 2022-03-10: 4 mg via INTRAVENOUS
  Filled 2022-03-10: qty 2

## 2022-03-10 MED ORDER — LIDOCAINE 5 % EX PTCH
1.0000 | MEDICATED_PATCH | Freq: Once | CUTANEOUS | Status: DC
  Administered 2022-03-10: 1 via TRANSDERMAL
  Filled 2022-03-10: qty 1

## 2022-03-10 MED ORDER — PAROXETINE HCL 10 MG PO TABS: 10.0000 mg | ORAL_TABLET | Freq: Every day | ORAL | 0 refills | Status: DC

## 2022-03-10 MED ORDER — NITROGLYCERIN 0.4 MG SL SUBL
0.4000 mg | SUBLINGUAL_TABLET | SUBLINGUAL | 0 refills | Status: DC | PRN
Start: 2022-03-10 — End: 2022-04-26

## 2022-03-10 NOTE — Discharge Instructions (Addendum)
We have restarted you on your medications and you can return to the ER if you develop worsening chest pain, shortness of breath or any other concerns.  There is no evidence of heart attack today and you can follow-up with the heart failure clinic as needed.
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
## 2022-03-12 ENCOUNTER — Other Ambulatory Visit: Payer: Self-pay

## 2022-03-12 ENCOUNTER — Other Ambulatory Visit (HOSPITAL_COMMUNITY): Payer: Self-pay

## 2022-03-12 ENCOUNTER — Encounter: Payer: Self-pay | Admitting: *Deleted

## 2022-03-12 DIAGNOSIS — M6282 Rhabdomyolysis: Secondary | ICD-10-CM | POA: Insufficient documentation

## 2022-03-12 DIAGNOSIS — D72819 Decreased white blood cell count, unspecified: Secondary | ICD-10-CM | POA: Insufficient documentation

## 2022-03-12 DIAGNOSIS — M791 Myalgia, unspecified site: Secondary | ICD-10-CM | POA: Diagnosis present

## 2022-03-12 LAB — CBC WITH DIFFERENTIAL/PLATELET
Abs Immature Granulocytes: 0.01 10*3/uL (ref 0.00–0.07)
Basophils Absolute: 0.1 10*3/uL (ref 0.0–0.1)
Basophils Relative: 2 %
Eosinophils Absolute: 0.2 10*3/uL (ref 0.0–0.5)
Eosinophils Relative: 5 %
HCT: 35.2 % — ABNORMAL LOW (ref 39.0–52.0)
Hemoglobin: 11.3 g/dL — ABNORMAL LOW (ref 13.0–17.0)
Immature Granulocytes: 0 %
Lymphocytes Relative: 25 %
Lymphs Abs: 0.9 10*3/uL (ref 0.7–4.0)
MCH: 30.3 pg (ref 26.0–34.0)
MCHC: 32.1 g/dL (ref 30.0–36.0)
MCV: 94.4 fL (ref 80.0–100.0)
Monocytes Absolute: 0.6 10*3/uL (ref 0.1–1.0)
Monocytes Relative: 17 %
Neutro Abs: 1.8 10*3/uL (ref 1.7–7.7)
Neutrophils Relative %: 51 %
Platelets: 117 10*3/uL — ABNORMAL LOW (ref 150–400)
RBC: 3.73 MIL/uL — ABNORMAL LOW (ref 4.22–5.81)
RDW: 15 % (ref 11.5–15.5)
WBC: 3.6 10*3/uL — ABNORMAL LOW (ref 4.0–10.5)
nRBC: 0 % (ref 0.0–0.2)

## 2022-03-12 LAB — CK: Total CK: 564 U/L — ABNORMAL HIGH (ref 49–397)

## 2022-03-12 LAB — COMPREHENSIVE METABOLIC PANEL
ALT: 36 U/L (ref 0–44)
AST: 65 U/L — ABNORMAL HIGH (ref 15–41)
Albumin: 3 g/dL — ABNORMAL LOW (ref 3.5–5.0)
Alkaline Phosphatase: 75 U/L (ref 38–126)
Anion gap: 7 (ref 5–15)
BUN: 12 mg/dL (ref 8–23)
CO2: 24 mmol/L (ref 22–32)
Calcium: 9.5 mg/dL (ref 8.9–10.3)
Chloride: 110 mmol/L (ref 98–111)
Creatinine, Ser: 1.26 mg/dL — ABNORMAL HIGH (ref 0.61–1.24)
GFR, Estimated: >60 mL/min (ref >60)
Glucose, Bld: 131 mg/dL — ABNORMAL HIGH (ref 70–99)
Potassium: 3.8 mmol/L (ref 3.5–5.1)
Sodium: 141 mmol/L (ref 135–145)
Total Bilirubin: 1.6 mg/dL — ABNORMAL HIGH (ref 0.3–1.2)
Total Protein: 6.9 g/dL (ref 6.5–8.1)

## 2022-03-12 LAB — TROPONIN I (HIGH SENSITIVITY): Troponin I (High Sensitivity): 13 ng/L (ref <18)
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
## 2022-03-12 NOTE — ED Triage Notes (Addendum)
Pt has leg cramps and hand cramps.  Sx for 3 days.  No chest pain or sob.  Pt alert  speech clear.

## 2022-03-12 NOTE — ED Provider Triage Note (Signed)
  Emergency Medicine Provider Triage Evaluation Note  [name] , a 71 y.o.male,  was evaluated in triage.  Pt complains of leg cramps and hand cramps x3 days.  Patient states that he often gets this way after taking Lasix due to reported electrolyte deficiencies.  Denies any recent illnesses.   Review of Systems  Positive: Leg cramps, and cramps Negative: Denies fever, chest pain, vomiting  Physical Exam   Vitals:   03/12/22 2258  BP: 98/63  Pulse: (!) 58  Resp: 18  Temp: 98 F (36.7 C)  SpO2: 97%   Gen:   Awake, no distress   Resp:  Normal effort  MSK:   Moves extremities without difficulty  Other:    Medical Decision Making  Given the patient's initial medical screening exam, the following diagnostic evaluation has been ordered. The patient will be placed in the appropriate treatment space, once one is available, to complete the evaluation and treatment. I have discussed the plan of care with the patient and I have advised the patient that an ED physician or mid-level practitioner will reevaluate their condition after the test results have been received, as the results may give them additional insight into the type of treatment they may need.    Diagnostics: Labs  Treatments: none immediately   Teodoro Spray, [place] 03/12/22 2304

## 2022-03-13 ENCOUNTER — Emergency Department
Admission: EM | Admit: 2022-03-13 | Discharge: 2022-03-13 | Disposition: A | Discharge status: Home / Self Care | Payer: Medicare Other | Attending: Emergency Medicine | Admitting: Emergency Medicine

## 2022-03-13 DIAGNOSIS — M6282 Rhabdomyolysis: Secondary | ICD-10-CM

## 2022-03-13 DIAGNOSIS — M791 Myalgia, unspecified site: Secondary | ICD-10-CM

## 2022-03-13 LAB — MAGNESIUM: Magnesium: 1.9 mg/dL (ref 1.7–2.4)

## 2022-03-13 LAB — TROPONIN I (HIGH SENSITIVITY): Troponin I (High Sensitivity): 12 ng/L (ref <18)

## 2022-03-13 MED ORDER — OXYCODONE HCL 5 MG PO TABS
5.0000 mg | ORAL_TABLET | Freq: Once | ORAL | Status: AC
  Administered 2022-03-13: 5 mg via ORAL
  Filled 2022-03-13: qty 1

## 2022-03-13 MED ORDER — LACTATED RINGERS IV BOLUS
1000.0000 mL | Freq: Once | INTRAVENOUS | Status: AC
  Administered 2022-03-13: 1000 mL via INTRAVENOUS

## 2022-03-13 MED ORDER — ACETAMINOPHEN 500 MG PO TABS
1000.0000 mg | ORAL_TABLET | Freq: Once | ORAL | Status: AC
  Administered 2022-03-13: 1000 mg via ORAL
  Filled 2022-03-13: qty 2

## 2022-03-13 NOTE — ED Provider Notes (Signed)
[facility] Provider Note    Event Date/Time   First MD Initiated Contact with Patient 03/13/22 [phone]     (approximate)   History   leg cramps   HPI  [name] is a 71 y.o. male who presents to the ED for evaluation of leg cramps   Patient presents to the ED for evaluation of myalgias for 3 to 4 days.  Denies any falls or injuries, but reports has been walking around outside in the heat all day and that he is homeless.  Denies any falls, injuries or trauma.  Denies any fevers, emesis, diarrhea, chest pain or shortness of breath.  Reports atraumatic cramping throughout much of his body.   Physical Exam   Triage Vital Signs: ED Triage Vitals  Enc Vitals Group     BP 03/12/22 2258 98/63     Pulse Rate 03/12/22 2258 (!) 58     Resp 03/12/22 2258 18     Temp 03/12/22 2258 98 F (36.7 C)     Temp Source 03/12/22 2258 Oral     SpO2 03/12/22 2258 97 %     Weight 03/12/22 2300 276 lb (125.2 kg)     Height 03/12/22 2300 '5\' 6"'$  (1.676 m)     Head Circumference --      Peak Flow --      Pain Score 03/12/22 2259 10     Pain Loc --      Pain Edu? --      Excl. in [place]? --     Most recent vital signs: Vitals:   03/13/22 0144 03/13/22 0414  BP: 120/63 (!) 170/80  Pulse: (!) 51 61  Resp:  19  Temp: 98.7 F (37.1 C)   SpO2: 99% 99%    General: Awake, no distress.  Ambulatory. CV:  Good peripheral perfusion.  Resp:  Normal effort.  Abd:  No distention.  Soft and benign MSK:  No deformity noted.  No signs of trauma, deformity or skin changes. Neuro:  No focal deficits appreciated. Other:     ED Results / Procedures / Treatments   Labs (all labs ordered are listed, but only abnormal results are displayed) Labs Reviewed  COMPREHENSIVE METABOLIC PANEL - Abnormal; Notable for the following components:      Result Value   Glucose, Bld 131 (*)    Creatinine, Ser 1.26 (*)    Albumin 3.0 (*)    AST 65 (*)    Total Bilirubin 1.6 (*)    All  other components within normal limits  CBC WITH DIFFERENTIAL/PLATELET - Abnormal; Notable for the following components:   WBC 3.6 (*)    RBC 3.73 (*)    Hemoglobin 11.3 (*)    HCT 35.2 (*)    Platelets 117 (*)    All other components within normal limits  CK - Abnormal; Notable for the following components:   Total CK 564 (*)    All other components within normal limits  MAGNESIUM  TROPONIN I (HIGH SENSITIVITY)  TROPONIN I (HIGH SENSITIVITY)    EKG   RADIOLOGY   Official radiology report(s): No results found.  PROCEDURES and INTERVENTIONS:  Procedures  Medications  lactated ringers bolus 1,000 mL (1,000 mLs Intravenous Bolus 03/13/22 0414)  acetaminophen (TYLENOL) tablet 1,000 mg (1,000 mg Oral Given 03/13/22 0413)  oxyCODONE (Oxy IR/ROXICODONE) immediate release tablet 5 mg (5 mg Oral Given 03/13/22 0413)     IMPRESSION / MDM / ASSESSMENT AND PLAN / ED  COURSE  I reviewed the triage vital signs and the nursing notes.  Differential diagnosis includes, but is not limited to, rhabdomyolysis, AKI, hypokalemia, trauma  71 year old male presents to the ED with myalgias and evidence of mild rhabdo myelitis is suitable for outpatient management after IV fluid resuscitation.  Look systemically well.  CK is slightly elevated above reference range for which she receives IV fluids.  Blood work otherwise reassuring.  Minimal leukopenia is noted, no electrolyte derangements.  Troponin is negative.  Resolved symptoms after IV fluids.  We discussed return precautions and safe care as an outpatient and summer heat.       FINAL CLINICAL IMPRESSION(S) / ED DIAGNOSES   Final diagnoses:  Myalgia  Non-traumatic rhabdomyolysis     Rx / DC Orders   ED Discharge Orders     None        Note:  This document was prepared using Dragon voice recognition software and may include unintentional dictation errors.   Vladimir Crofts, MD 03/13/22 [phone]

## 2022-03-15 ENCOUNTER — Emergency Department: Payer: Medicare Other

## 2022-03-15 ENCOUNTER — Encounter: Payer: Self-pay | Admitting: Emergency Medicine

## 2022-03-15 ENCOUNTER — Emergency Department
Admission: EM | Admit: 2022-03-15 | Discharge: 2022-03-15 | Disposition: A | Discharge status: Home / Self Care | Payer: Medicare Other | Attending: Emergency Medicine | Admitting: Emergency Medicine

## 2022-03-15 ENCOUNTER — Other Ambulatory Visit: Payer: Self-pay

## 2022-03-15 DIAGNOSIS — D696 Thrombocytopenia, unspecified: Secondary | ICD-10-CM | POA: Diagnosis not present

## 2022-03-15 DIAGNOSIS — J45909 Unspecified asthma, uncomplicated: Secondary | ICD-10-CM | POA: Diagnosis not present

## 2022-03-15 DIAGNOSIS — I509 Heart failure, unspecified: Secondary | ICD-10-CM | POA: Insufficient documentation

## 2022-03-15 DIAGNOSIS — Z7982 Long term (current) use of aspirin: Secondary | ICD-10-CM | POA: Diagnosis not present

## 2022-03-15 DIAGNOSIS — F22 Delusional disorders: Secondary | ICD-10-CM | POA: Insufficient documentation

## 2022-03-15 DIAGNOSIS — Z7984 Long term (current) use of oral hypoglycemic drugs: Secondary | ICD-10-CM | POA: Diagnosis not present

## 2022-03-15 DIAGNOSIS — Z20822 Contact with and (suspected) exposure to covid-19: Secondary | ICD-10-CM | POA: Diagnosis not present

## 2022-03-15 DIAGNOSIS — R4182 Altered mental status, unspecified: Secondary | ICD-10-CM | POA: Insufficient documentation

## 2022-03-15 DIAGNOSIS — I110 Hypertensive heart disease with heart failure: Secondary | ICD-10-CM | POA: Diagnosis not present

## 2022-03-15 DIAGNOSIS — J449 Chronic obstructive pulmonary disease, unspecified: Secondary | ICD-10-CM | POA: Diagnosis not present

## 2022-03-15 DIAGNOSIS — Z79899 Other long term (current) drug therapy: Secondary | ICD-10-CM | POA: Insufficient documentation

## 2022-03-15 DIAGNOSIS — E119 Type 2 diabetes mellitus without complications: Secondary | ICD-10-CM | POA: Insufficient documentation

## 2022-03-15 DIAGNOSIS — Z8616 Personal history of COVID-19: Secondary | ICD-10-CM | POA: Diagnosis not present

## 2022-03-15 DIAGNOSIS — Z5900 Homelessness unspecified: Secondary | ICD-10-CM | POA: Diagnosis not present

## 2022-03-15 DIAGNOSIS — R7989 Other specified abnormal findings of blood chemistry: Secondary | ICD-10-CM | POA: Insufficient documentation

## 2022-03-15 DIAGNOSIS — N179 Acute kidney failure, unspecified: Secondary | ICD-10-CM | POA: Diagnosis not present

## 2022-03-15 DIAGNOSIS — R748 Abnormal levels of other serum enzymes: Secondary | ICD-10-CM

## 2022-03-15 DIAGNOSIS — Z5902 Unsheltered homelessness: Secondary | ICD-10-CM

## 2022-03-15 HISTORY — DX: Other specified infestations: B88.8

## 2022-03-15 LAB — COMPREHENSIVE METABOLIC PANEL
ALT: 41 U/L (ref 0–44)
AST: 81 U/L — ABNORMAL HIGH (ref 15–41)
Albumin: 3.1 g/dL — ABNORMAL LOW (ref 3.5–5.0)
Alkaline Phosphatase: 71 U/L (ref 38–126)
Anion gap: 5 (ref 5–15)
BUN: 12 mg/dL (ref 8–23)
CO2: 22 mmol/L (ref 22–32)
Calcium: 9.9 mg/dL (ref 8.9–10.3)
Chloride: 115 mmol/L — ABNORMAL HIGH (ref 98–111)
Creatinine, Ser: 1.38 mg/dL — ABNORMAL HIGH (ref 0.61–1.24)
GFR, Estimated: 54 mL/min — ABNORMAL LOW (ref >60)
Glucose, Bld: 110 mg/dL — ABNORMAL HIGH (ref 70–99)
Potassium: 3.7 mmol/L (ref 3.5–5.1)
Sodium: 142 mmol/L (ref 135–145)
Total Bilirubin: 2 mg/dL — ABNORMAL HIGH (ref 0.3–1.2)
Total Protein: 7.1 g/dL (ref 6.5–8.1)

## 2022-03-15 LAB — SALICYLATE LEVEL: Salicylate Lvl: <7 mg/dL — ABNORMAL LOW (ref 7.0–30.0)

## 2022-03-15 LAB — CBC WITH DIFFERENTIAL/PLATELET
Abs Immature Granulocytes: 0.02 10*3/uL (ref 0.00–0.07)
Basophils Absolute: 0.1 10*3/uL (ref 0.0–0.1)
Basophils Relative: 1 %
Eosinophils Absolute: 0 10*3/uL (ref 0.0–0.5)
Eosinophils Relative: 1 %
HCT: 36.7 % — ABNORMAL LOW (ref 39.0–52.0)
Hemoglobin: 11.6 g/dL — ABNORMAL LOW (ref 13.0–17.0)
Immature Granulocytes: 0 %
Lymphocytes Relative: 11 %
Lymphs Abs: 0.5 10*3/uL — ABNORMAL LOW (ref 0.7–4.0)
MCH: 30.5 pg (ref 26.0–34.0)
MCHC: 31.6 g/dL (ref 30.0–36.0)
MCV: 96.6 fL (ref 80.0–100.0)
Monocytes Absolute: 0.6 10*3/uL (ref 0.1–1.0)
Monocytes Relative: 12 %
Neutro Abs: 3.7 10*3/uL (ref 1.7–7.7)
Neutrophils Relative %: 75 %
Platelets: 101 10*3/uL — ABNORMAL LOW (ref 150–400)
RBC: 3.8 MIL/uL — ABNORMAL LOW (ref 4.22–5.81)
RDW: 14.8 % (ref 11.5–15.5)
WBC: 4.9 10*3/uL (ref 4.0–10.5)
nRBC: 0 % (ref 0.0–0.2)

## 2022-03-15 LAB — URINALYSIS, ROUTINE W REFLEX MICROSCOPIC
Bilirubin Urine: NEGATIVE
Glucose, UA: NEGATIVE mg/dL
Hgb urine dipstick: NEGATIVE
Ketones, ur: 5 mg/dL — AB
Leukocytes,Ua: NEGATIVE
Nitrite: NEGATIVE
Protein, ur: 100 mg/dL — AB
Specific Gravity, Urine: 1.025 (ref 1.005–1.030)
pH: 5 (ref 5.0–8.0)

## 2022-03-15 LAB — URINE DRUG SCREEN, QUALITATIVE (ARMC ONLY)
Amphetamines, Ur Screen: NOT DETECTED
Barbiturates, Ur Screen: NOT DETECTED
Benzodiazepine, Ur Scrn: NOT DETECTED
Cannabinoid 50 Ng, Ur ~~LOC~~: NOT DETECTED
Cocaine Metabolite,Ur ~~LOC~~: NOT DETECTED
MDMA (Ecstasy)Ur Screen: NOT DETECTED
Methadone Scn, Ur: NOT DETECTED
Opiate, Ur Screen: NOT DETECTED
Phencyclidine (PCP) Ur S: NOT DETECTED
Tricyclic, Ur Screen: POSITIVE — AB

## 2022-03-15 LAB — RESP PANEL BY RT-PCR (FLU A&B, COVID) ARPGX2
Influenza A by PCR: NEGATIVE
Influenza B by PCR: NEGATIVE
SARS Coronavirus 2 by RT PCR: NEGATIVE

## 2022-03-15 LAB — MAGNESIUM: Magnesium: 1.7 mg/dL (ref 1.7–2.4)

## 2022-03-15 LAB — ETHANOL: Alcohol, Ethyl (B): <10 mg/dL (ref <10)

## 2022-03-15 LAB — ACETAMINOPHEN LEVEL: Acetaminophen (Tylenol), Serum: <10 ug/mL — ABNORMAL LOW (ref 10–30)

## 2022-03-15 LAB — CK: Total CK: 899 U/L — ABNORMAL HIGH (ref 49–397)

## 2022-03-15 MED ORDER — SODIUM CHLORIDE 0.9 % IV SOLN: INTRAVENOUS | Status: DC

## 2022-03-15 MED ORDER — SODIUM CHLORIDE 0.9 % IV BOLUS (SEPSIS)
1000.0000 mL | Freq: Once | INTRAVENOUS | Status: AC
  Administered 2022-03-15: 1000 mL via INTRAVENOUS

## 2022-03-15 NOTE — ED Provider Notes (Addendum)
[facility] Provider Note    Event Date/Time   First MD Initiated Contact with Patient 03/15/22 [phone]     (approximate)   History   Hallucinations   HPI  [name] is a 71 y.o. male with history of CHF, COPD, hypertension, diabetes, homelessness who presents to the emergency department with police for concerns for paranoia.  Patient states that he was walking tonight with a girl that he describes as a friend when he states 3 people were following them and "snatched her up".  Police brought him here for psychiatric evaluation.  He states he does not know why the people were following him.  He denies any SI, HI or auditory hallucinations.  Denies drug or alcohol use.  Told nursing staff on arrival that he was seeing "orbs".  Complains of chronic cramps in his feet and legs.  No injury.  No chest pain or shortness of breath.    History provided by patient.    Past Medical History:  Diagnosis Date   Asthma    CHF (congestive heart failure) (HCC)    COPD (chronic obstructive pulmonary disease) ([place])    COVID-19 03/2020   diagnosed in August 2021   Diabetes mellitus without complication ([place])    Homelessness    Hypertension    Infestation by bed bug    Migraine    Obesity    Sleep apnea     Past Surgical History:  Procedure Laterality Date   CARDIAC CATHETERIZATION     CHOLECYSTECTOMY     EYE SURGERY     INNER EAR SURGERY     NOSE SURGERY      MEDICATIONS:  Prior to Admission medications   Medication Sig Start Date End Date Taking? Authorizing Provider  albuterol (PROVENTIL) (2.5 MG/3ML) 0.083% nebulizer solution Take 3 mLs by nebulization every 4 (four) hours as needed for wheezing or shortness of breath. 03/10/22 04/09/22  Vanessa [place], MD  albuterol (VENTOLIN HFA) 108 (90 Base) MCG/ACT inhaler Inhale 2 puffs into the lungs every 6 (six) hours as needed for wheezing or shortness of breath. 03/10/22 04/09/22  Vanessa [place], MD  ascorbic  acid (VITAMIN C) 500 MG tablet Take 1 tablet (500 mg total) by mouth daily. Patient not taking: Reported on 03/10/2022 04/20/20   Samuella Cota, MD  aspirin EC 81 MG EC tablet Take 1 tablet (81 mg total) by mouth daily. Swallow whole. Patient not taking: Reported on 01/30/2022 10/06/21   Aline August, MD  atorvastatin (LIPITOR) 40 MG tablet Take 1 tablet (40 mg total) by mouth daily. Patient not taking: Reported on 01/30/2022 10/05/21   Aline August, MD  buprenorphine-naloxone (SUBOXONE) 2-0.5 mg SUBL SL tablet Place 1 tablet under the tongue in the morning and at bedtime. [name]    [provider]  cetirizine (ZYRTEC ALLERGY) 10 MG tablet Take 1 tablet (10 mg total) by mouth daily. 07/04/20   Blake Divine, MD  cyclobenzaprine (FLEXERIL) 5 MG tablet Take 5 mg by mouth at bedtime as needed for muscle spasms.    [provider]  EPINEPHrine 0.3 mg/0.3 mL IJ SOAJ injection Inject 0.3 mg into the muscle as needed for anaphylaxis.    [provider]  fluticasone (FLONASE) 50 MCG/ACT nasal spray Place 1 spray into both nostrils daily.    [provider]  furosemide (LASIX) 80 MG tablet Take 1 tablet (80 mg total) by mouth daily for 14 days. 03/10/22 03/24/22  Marjean Donna  E, MD  glucosamine-chondroitin 500-400 MG tablet Take 1 tablet by mouth 3 (three) times daily.    [provider]  hydrOXYzine (ATARAX/VISTARIL) 10 MG tablet Take 10 mg by mouth 3 (three) times daily as needed for itching or anxiety.    [provider]  JARDIANCE 10 MG TABS tablet Take 1 tablet (10 mg total) by mouth daily for 14 days. 03/10/22 03/24/22  Vanessa [place], MD  lisinopril (ZESTRIL) 10 MG tablet Take 3 tablets (30 mg total) by mouth daily for 14 days. 03/10/22 03/24/22  Vanessa [place], MD  metFORMIN (GLUCOPHAGE-XR) 500 MG 24 hr tablet Take 1 tablet (500 mg total) by mouth daily for 14 days. 03/10/22 03/24/22  Vanessa [place], MD  Multiple Vitamin (MULTIVITAMIN WITH MINERALS)  TABS tablet Take 1 tablet by mouth daily.    [provider]  nitroGLYCERIN (NITROSTAT) 0.4 MG SL tablet Place 1 tablet (0.4 mg total) under the tongue every 5 (five) minutes x 3 doses as needed for up to 14 days for chest pain. 03/10/22 03/24/22  Vanessa [place], MD  omeprazole (PRILOSEC) 40 MG capsule Take 1 capsule (40 mg total) by mouth daily for 14 days. 03/10/22 03/24/22  Vanessa [place], MD  PARoxetine (PAXIL) 10 MG tablet Take 1 tablet (10 mg total) by mouth daily for 14 days. 03/10/22 03/24/22  Vanessa , MD  Sofosbuvir-Velpatasvir 400-100 MG TABS Take 1 tablet by mouth daily.    [provider]  sucralfate (CARAFATE) 1 GM/10ML suspension Take 10 mLs (1 g total) by mouth 4 (four) times daily. Patient not taking: Reported on 03/10/2022 12/01/21   Merlyn Lot, MD  tamsulosin (FLOMAX) 0.4 MG CAPS capsule Take 0.4 mg by mouth daily.    [provider]    Physical Exam   Triage Vital Signs: ED Triage Vitals [03/15/22 0230]  Enc Vitals Group     BP (!) 163/78     Pulse Rate 91     Resp (!) 22     Temp 97.9 F (36.6 C)     Temp Source Oral     SpO2 97 %     Weight      Height      Head Circumference      Peak Flow      Pain Score      Pain Loc      Pain Edu?      Excl. in [place]?     Most recent vital signs: Vitals:   03/15/22 0300 03/15/22 0454  BP: (!) 146/84 118/72  Pulse: 88 69  Resp: 20 18  Temp:    SpO2: 97% 97%    CONSTITUTIONAL: Alert and oriented and responds appropriately to questions.  Elderly, obese, disheveled HEAD: Normocephalic, atraumatic EYES: Conjunctivae clear, pupils appear equal, sclera nonicteric ENT: normal nose; moist mucous membranes NECK: Supple, normal ROM CARD: RRR; S1 and S2 appreciated; no murmurs, no clicks, no rubs, no gallops RESP: Normal chest excursion without splinting or tachypnea; breath sounds clear and equal bilaterally; no wheezes, no rhonchi, no rales, no hypoxia or respiratory distress, speaking full  sentences ABD/GI: Normal bowel sounds; non-distended; soft, non-tender, no rebound, no guarding, no peritoneal signs BACK: The back appears normal EXT: Normal ROM in all joints; no deformity noted, no edema; no cyanosis SKIN: Normal color for age and race; warm; no rash on exposed skin NEURO: Moves all extremities equally, normal speech, no facial asymmetry, ambulates with normal gait PSYCH: Reports being followed by  3 unknown people tonight but does not appear significantly paranoid currently.  Not responding to internal stimuli.  Denies SI or HI.   ED Results / Procedures / Treatments   LABS: (all labs ordered are listed, but only abnormal results are displayed) Labs Reviewed  COMPREHENSIVE METABOLIC PANEL - Abnormal; Notable for the following components:      Result Value   Chloride 115 (*)    Glucose, Bld 110 (*)    Creatinine, Ser 1.38 (*)    Albumin 3.1 (*)    AST 81 (*)    Total Bilirubin 2.0 (*)    GFR, Estimated 54 (*)    All other components within normal limits  CBC WITH DIFFERENTIAL/PLATELET - Abnormal; Notable for the following components:   RBC 3.80 (*)    Hemoglobin 11.6 (*)    HCT 36.7 (*)    Platelets 101 (*)    Lymphs Abs 0.5 (*)    All other components within normal limits  ACETAMINOPHEN LEVEL - Abnormal; Notable for the following components:   Acetaminophen (Tylenol), Serum <10 (*)    All other components within normal limits  SALICYLATE LEVEL - Abnormal; Notable for the following components:   Salicylate Lvl <1.6 (*)    All other components within normal limits  CK - Abnormal; Notable for the following components:   Total CK 899 (*)    All other components within normal limits  RESP PANEL BY RT-PCR (FLU A&B, COVID) ARPGX2  ETHANOL  MAGNESIUM  URINE DRUG SCREEN, QUALITATIVE ([facility] ONLY)  URINALYSIS, ROUTINE W REFLEX MICROSCOPIC     EKG:  Date: 03/15/2022  Rate: 88  Rhythm: normal sinus rhythm  QRS Axis: normal  Intervals: normal  ST/T Wave  abnormalities: normal  Conduction Disutrbances: none  Narrative Interpretation: unremarkable     RADIOLOGY: My personal review and interpretation of imaging: CT head shows no acute abnormality.  I have personally reviewed all radiology reports.   CT HEAD WO CONTRAST (5MM)  Result Date: 03/15/2022 CLINICAL DATA:  Altered mental status EXAM: CT HEAD WITHOUT CONTRAST TECHNIQUE: Contiguous axial images were obtained from the base of the skull through the vertex without intravenous contrast. RADIATION DOSE REDUCTION: This exam was performed according to the departmental dose-optimization program which includes automated exposure control, adjustment of the mA and/or kV according to patient size and/or use of iterative reconstruction technique. COMPARISON:  03/10/2022 FINDINGS: Brain: There is no mass, hemorrhage or extra-axial collection. The size and configuration of the ventricles and extra-axial CSF spaces are normal. The brain parenchyma is normal, without acute or chronic infarction. Vascular: No abnormal hyperdensity of the major intracranial arteries or dural venous sinuses. No intracranial atherosclerosis. Skull: The visualized skull base, calvarium and extracranial soft tissues are normal. Sinuses/Orbits: No fluid levels or advanced mucosal thickening of the visualized paranasal sinuses. No mastoid or middle ear effusion. The orbits are normal. IMPRESSION: Normal head CT. Electronically Signed   By: Ulyses Jarred M.D.   On: 03/15/2022 03:55     PROCEDURES:  Critical Care performed: No     Procedures    IMPRESSION / MDM / ASSESSMENT AND PLAN / ED COURSE  I reviewed the triage vital signs and the nursing notes.    Patient here with paranoia and possible hallucinations.  Unable to confirm with police that he was actually being followed.  Picked up by police from the bus stop.     DIFFERENTIAL DIAGNOSIS (includes but not limited to):   Paranoia from psychosis, substance abuse,  UTI, brain mass or intracranial hemorrhage, malingering  Muscle cramps seem chronic.  He is on a statin.  This could be contributing.  Also homeless and has been quite hot outside.  Concern for heat cramps, heat exhaustion, electrolyte derangement.   Patient's presentation is most consistent with acute presentation with potential threat to life or bodily function.   PLAN: We will obtain CBC, CMP, ethanol level, Tylenol and salicylate level, urine drug screen, urinalysis, magnesium level, CK level.  Will obtain CT head, EKG.  We will consult psychiatry and TTS.   MEDICATIONS GIVEN IN ED: Medications  sodium chloride 0.9 % bolus 1,000 mL (has no administration in time range)  0.9 %  sodium chloride infusion (has no administration in time range)     ED COURSE: Patient's labs show stable hemoglobin, no leukocytosis.  Stable thrombocytopenia.  Patient has mild AKI with creatinine at 1.38 up from 1.26 three days ago.  CK level is also rising with CK of 899 up from previous of 564.  Normal electrolytes.  Will give IV fluids.  I do not feel he needs to be admitted to the hospital for this given only minimal elevation of his creatinine and CK.  CT head reviewed/interpreted by myself radiologist and shows no acute abnormality.   CONSULTS: Psychiatry, TTS, case management and social work consulted for further management.  TTS has evaluated patient and feels that this is likely malingering and I agree.  Psychiatry to see patient in the morning.  Given patient is homeless, case management and social work have also been consulted.   OUTSIDE RECORDS REVIEWED: Reviewed patient's last admission in February 2023 for chest pain, AKI.       FINAL CLINICAL IMPRESSION(S) / ED DIAGNOSES   Final diagnoses:  Paranoia ([place])  AKI (acute kidney injury) ([place])  Elevated CK  Homelessness     Rx / DC Orders   ED Discharge Orders     None        Note:  This document was prepared using Dragon voice  recognition software and may include unintentional dictation errors.   Patrick Sohm, Delice Bison, DO 03/15/22 [place], Delice Bison, DO 03/15/22 [phone]

## 2022-03-15 NOTE — ED Notes (Signed)
Pt to CT

## 2022-03-15 NOTE — ED Provider Notes (Addendum)
UA negative.  Patient medically cleared pending dispo from psych  Cleared by psych for discharge  Vanessa Berwick, MD 03/15/22 0122    Vanessa [place], MD 03/15/22 2411    Vanessa [place], MD 03/15/22 [phone]

## 2022-03-15 NOTE — Consult Note (Addendum)
[facility] Consult   Reason for Consult: Possible hallucinations Referring Physician: Jari Pigg Patient Identification: [name] MRN:  [id number] Principal Diagnosis: Homelessness Diagnosis:  Principal Problem:   Homelessness   Total Time spent with patient: 30 minutes  Subjective:  "I would like a place to rest for a few days."   [name] is a 71 y.o. male patient admitted with complaints of somebody following him   HPI:  On evaluation, patient is alert and cooperative, pleasant.  He is clear, coherent, able to express himself in linear sentences.  He states that he was staying in a house with some other people and he is not sure where he is going to go now.  He is unclear with his expression of exactly what happened, but states that he thinks somebody was following him and was worried about that.   Patient denies any suicidal or homicidal ideations.  Denies auditory or visual hallucinations at this time.  Patient may be expressing complaints for secondary gain.  He also states "I would like a place to rest for a few days."   Patient does not meet criteria for inpatient psychiatric treatment.  Referred to [facility] for outpatient resources for mental health if he believes that he is truly paranoid, or has other mental health or substance use issues.  Patient was referred to social work to provide outpatient resources for housing.  Past Psychiatric History: Denies  Risk to Self:   Risk to Others:   Prior Inpatient Therapy:   Prior Outpatient Therapy:    Past Medical History:  Past Medical History:  Diagnosis Date   Asthma    CHF (congestive heart failure) (HCC)    COPD (chronic obstructive pulmonary disease) ([place])    COVID-19 03/2020   diagnosed in August 2021   Diabetes mellitus without complication ([place])    Homelessness    Hypertension    Infestation by bed bug    Migraine    Obesity    Sleep apnea     Past Surgical History:  Procedure Laterality  Date   CARDIAC CATHETERIZATION     CHOLECYSTECTOMY     EYE SURGERY     INNER EAR SURGERY     NOSE SURGERY     Family History:  Family History  Problem Relation Age of Onset   Hypertension Mother    Heart disease Mother    Heart failure Maternal Grandmother    Family Psychiatric  History: None reported Social History:  Social History   Substance and Sexual Activity  Alcohol Use Never     Social History   Substance and Sexual Activity  Drug Use Never    Social History   Socioeconomic History   Marital status: Single    Spouse name: Not on file   Number of children: 2   Years of education: 12   Highest education level: 12th grade  Occupational History   Occupation: retired  Tobacco Use   Smoking status: Former   Smokeless tobacco: Never  Scientific laboratory technician Use: Not on file  Substance and Sexual Activity   Alcohol use: Never   Drug use: Never   Sexual activity: Never    Birth control/protection: None  Other Topics Concern   Not on file  Social History Narrative   Not on file   Social Determinants of Health   Financial Resource Strain: High Risk (04/16/2018)   Overall Financial Resource Strain (CARDIA)    Difficulty of Paying Living Expenses:  Very hard  Food Insecurity: Food Insecurity Present (04/16/2018)   Hunger Vital Sign    Worried About Running Out of Food in the Last Year: Often true    Ran Out of Food in the Last Year: Often true  Transportation Needs: Unmet Transportation Needs (04/16/2018)   PRAPARE - Hydrologist (Medical): Yes    Lack of Transportation (Non-Medical): Yes  Physical Activity: Sufficiently Active (04/16/2018)   Exercise Vital Sign    Days of Exercise per Week: 7 days    Minutes of Exercise per Session: 30 min  Stress: Stress Concern Present (04/16/2018)   [place]    Feeling of Stress : Very much  Social Connections: Moderately  Isolated (04/30/2018)   Social Connection and Isolation Panel [NHANES]    Frequency of Communication with Friends and Family: Twice a week    Frequency of Social Gatherings with Friends and Family: Never    Attends Religious Services: More than 4 times per year    Active Member of [employer] or Organizations: No    Attends Archivist Meetings: Never    Marital Status: Widowed   Additional Social History:    Allergies:   Allergies  Allergen Reactions   Novocain [Procaine]    Sulfa Antibiotics Other (See Comments)    Labs:  Results for orders placed or performed during the hospital encounter of 03/15/22 (from the past 48 hour(s))  Resp Panel by RT-PCR (Flu A&B, Covid) Anterior Nasal Swab     Status: None   Collection Time: 03/15/22  3:32 AM   Specimen: Anterior Nasal Swab  Result Value Ref Range   SARS Coronavirus 2 by RT PCR NEGATIVE NEGATIVE    Comment: (NOTE) SARS-CoV-2 target nucleic acids are NOT DETECTED.  The SARS-CoV-2 RNA is generally detectable in upper respiratory specimens during the acute phase of infection. The lowest concentration of SARS-CoV-2 viral copies this assay can detect is 138 copies/mL. A negative result does not preclude SARS-Cov-2 infection and should not be used as the sole basis for treatment or other patient management decisions. A negative result may occur with  improper specimen collection/handling, submission of specimen other than nasopharyngeal swab, presence of viral mutation(s) within the areas targeted by this assay, and inadequate number of viral copies(<138 copies/mL). A negative result must be combined with clinical observations, patient history, and epidemiological information. The expected result is Negative.  Fact Sheet for Patients:  EntrepreneurPulse.com.au  Fact Sheet for Healthcare Providers:  IncredibleEmployment.be  This test is no t yet approved or cleared by the [place] FDA  and  has been authorized for detection and/or diagnosis of SARS-CoV-2 by FDA under an Emergency Use Authorization (EUA). This EUA will remain  in effect (meaning this test can be used) for the duration of the COVID-19 declaration under Section 564(b)(1) of the Act, 21 U.S.C.section 360bbb-3(b)(1), unless the authorization is terminated  or revoked sooner.       Influenza A by PCR NEGATIVE NEGATIVE   Influenza B by PCR NEGATIVE NEGATIVE    Comment: (NOTE) The Xpert Xpress SARS-CoV-2/FLU/RSV plus assay is intended as an aid in the diagnosis of influenza from Nasopharyngeal swab specimens and should not be used as a sole basis for treatment. Nasal washings and aspirates are unacceptable for Xpert Xpress SARS-CoV-2/FLU/RSV testing.  Fact Sheet for Patients: EntrepreneurPulse.com.au  Fact Sheet for Healthcare Providers: IncredibleEmployment.be  This test is not yet approved or cleared by the [place]  States FDA and has been authorized for detection and/or diagnosis of SARS-CoV-2 by FDA under an Emergency Use Authorization (EUA). This EUA will remain in effect (meaning this test can be used) for the duration of the COVID-19 declaration under Section 564(b)(1) of the Act, 21 U.S.C. section 360bbb-3(b)(1), unless the authorization is terminated or revoked.  Performed at [facility], [address] [place], [place] [address]   Comprehensive metabolic panel     Status: Abnormal   Collection Time: 03/15/22  3:32 AM  Result Value Ref Range   Sodium 142 135 - 145 mmol/L   Potassium 3.7 3.5 - 5.1 mmol/L   Chloride 115 (H) 98 - 111 mmol/L   CO2 22 22 - 32 mmol/L   Glucose, Bld 110 (H) 70 - 99 mg/dL    Comment: Glucose reference range applies only to samples taken after fasting for at least 8 hours.   BUN 12 8 - 23 mg/dL   Creatinine, Ser 1.38 (H) 0.61 - 1.24 mg/dL   Calcium 9.9 8.9 - 10.3 mg/dL   Total Protein 7.1 6.5 - 8.1 g/dL   Albumin  3.1 (L) 3.5 - 5.0 g/dL   AST 81 (H) 15 - 41 U/L   ALT 41 0 - 44 U/L   Alkaline Phosphatase 71 38 - 126 U/L   Total Bilirubin 2.0 (H) 0.3 - 1.2 mg/dL   GFR, Estimated 54 (L) >60 mL/min    Comment: (NOTE) Calculated using the CKD-EPI Creatinine Equation (2021)    Anion gap 5 5 - 15    Comment: Performed at [facility], [address] [place], [place] [address]  Ethanol     Status: None   Collection Time: 03/15/22  3:32 AM  Result Value Ref Range   Alcohol, Ethyl (B) <10 <10 mg/dL    Comment: (NOTE) Lowest detectable limit for serum alcohol is 10 mg/dL.  For medical purposes only. Performed at [facility], [address] [place], [place] [address]   Urine Drug Screen, Qualitative     Status: Abnormal   Collection Time: 03/15/22  3:32 AM  Result Value Ref Range   Tricyclic, Ur Screen POSITIVE (A) NONE DETECTED   Amphetamines, Ur Screen NONE DETECTED NONE DETECTED   MDMA (Ecstasy)Ur Screen NONE DETECTED NONE DETECTED   Cocaine Metabolite,Ur [place] NONE DETECTED NONE DETECTED   Opiate, Ur Screen NONE DETECTED NONE DETECTED   Phencyclidine (PCP) Ur S NONE DETECTED NONE DETECTED   Cannabinoid 50 Ng, Ur [place] NONE DETECTED NONE DETECTED   Barbiturates, Ur Screen NONE DETECTED NONE DETECTED   Benzodiazepine, Ur Scrn NONE DETECTED NONE DETECTED   Methadone Scn, Ur NONE DETECTED NONE DETECTED    Comment: (NOTE) Tricyclics + metabolites, urine    Cutoff 1000 ng/mL Amphetamines + metabolites, urine  Cutoff 1000 ng/mL MDMA (Ecstasy), urine              Cutoff 500 ng/mL Cocaine Metabolite, urine          Cutoff 300 ng/mL Opiate + metabolites, urine        Cutoff 300 ng/mL Phencyclidine (PCP), urine         Cutoff 25 ng/mL Cannabinoid, urine                 Cutoff 50 ng/mL Barbiturates + metabolites, urine  Cutoff 200 ng/mL Benzodiazepine, urine              Cutoff 200 ng/mL Methadone, urine  Cutoff 300 ng/mL  The urine drug screen provides only a  preliminary, unconfirmed analytical test result and should not be used for non-medical purposes. Clinical consideration and professional judgment should be applied to any positive drug screen result due to possible interfering substances. A more specific alternate chemical method must be used in order to obtain a confirmed analytical result. Gas chromatography / mass spectrometry (GC/MS) is the preferred confirm atory method. Performed at [facility], [address] [place], [place] [address]   CBC with Diff     Status: Abnormal   Collection Time: 03/15/22  3:32 AM  Result Value Ref Range   WBC 4.9 4.0 - 10.5 K/uL   RBC 3.80 (L) 4.22 - 5.81 MIL/uL   Hemoglobin 11.6 (L) 13.0 - 17.0 g/dL   HCT 36.7 (L) 39.0 - 52.0 %   MCV 96.6 80.0 - 100.0 fL   MCH 30.5 26.0 - 34.0 pg   MCHC 31.6 30.0 - 36.0 g/dL   RDW 14.8 11.5 - 15.5 %   Platelets 101 (L) 150 - 400 K/uL   nRBC 0.0 0.0 - 0.2 %   Neutrophils Relative % 75 %   Neutro Abs 3.7 1.7 - 7.7 K/uL   Lymphocytes Relative 11 %   Lymphs Abs 0.5 (L) 0.7 - 4.0 K/uL   Monocytes Relative 12 %   Monocytes Absolute 0.6 0.1 - 1.0 K/uL   Eosinophils Relative 1 %   Eosinophils Absolute 0.0 0.0 - 0.5 K/uL   Basophils Relative 1 %   Basophils Absolute 0.1 0.0 - 0.1 K/uL   Immature Granulocytes 0 %   Abs Immature Granulocytes 0.02 0.00 - 0.07 K/uL    Comment: Performed at [facility], [address] [place], [place] [address]  Acetaminophen level     Status: Abnormal   Collection Time: 03/15/22  3:32 AM  Result Value Ref Range   Acetaminophen (Tylenol), Serum <10 (L) 10 - 30 ug/mL    Comment: (NOTE) Therapeutic concentrations vary significantly. A range of 10-30 ug/mL  may be an effective concentration for many patients. However, some  are best treated at concentrations outside of this range. Acetaminophen concentrations >150 ug/mL at 4 hours after ingestion  and >50 ug/mL at 12 hours after ingestion are often associated  with  toxic reactions.  Performed at [facility], [address] [place], [place] [address]   Salicylate level     Status: Abnormal   Collection Time: 03/15/22  3:32 AM  Result Value Ref Range   Salicylate Lvl <6.4 (L) 7.0 - 30.0 mg/dL    Comment: Performed at [facility], [address] [place], [place] [address]  Urinalysis, Routine w reflex microscopic     Status: Abnormal   Collection Time: 03/15/22  3:32 AM  Result Value Ref Range   Color, Urine AMBER (A) YELLOW    Comment: BIOCHEMICALS MAY BE AFFECTED BY COLOR   APPearance HAZY (A) CLEAR   Specific Gravity, Urine 1.025 1.005 - 1.030   pH 5.0 5.0 - 8.0   Glucose, UA NEGATIVE NEGATIVE mg/dL   Hgb urine dipstick NEGATIVE NEGATIVE   Bilirubin Urine NEGATIVE NEGATIVE   Ketones, ur 5 (A) NEGATIVE mg/dL   Protein, ur 100 (A) NEGATIVE mg/dL   Nitrite NEGATIVE NEGATIVE   Leukocytes,Ua NEGATIVE NEGATIVE   RBC / HPF 0-5 0 - 5 RBC/hpf   WBC, UA 0-5 0 - 5 WBC/hpf   Bacteria, UA RARE (A) NONE SEEN   Squamous Epithelial /  LPF 0-5 0 - 5   Mucus PRESENT    Hyaline Casts, UA PRESENT     Comment: Performed at [facility], [address] [place], [place] [address]  Magnesium     Status: None   Collection Time: 03/15/22  3:32 AM  Result Value Ref Range   Magnesium 1.7 1.7 - 2.4 mg/dL    Comment: Performed at [facility], [address] [place], [place] [address]  CK     Status: Abnormal   Collection Time: 03/15/22  3:32 AM  Result Value Ref Range   Total CK 899 (H) 49 - 397 U/L    Comment: Performed at [facility], [address] [place], [place] [address]    Current Facility-Administered Medications  Medication Dose Route Frequency Provider Last Rate Last Admin   0.9 %  sodium chloride infusion   Intravenous Continuous Ward, Kristen N, DO   Stopped at 03/15/22 1102   Current Outpatient Medications  Medication Sig Dispense Refill   buprenorphine-naloxone (SUBOXONE) 2-0.5  mg SUBL SL tablet Place 1 tablet under the tongue in the morning and at bedtime. [name]     furosemide (LASIX) 80 MG tablet Take 1 tablet (80 mg total) by mouth daily for 14 days. 14 tablet 0   JARDIANCE 10 MG TABS tablet Take 1 tablet (10 mg total) by mouth daily for 14 days. 14 tablet 0   lisinopril (ZESTRIL) 10 MG tablet Take 3 tablets (30 mg total) by mouth daily for 14 days. 42 tablet 0   metFORMIN (GLUCOPHAGE-XR) 500 MG 24 hr tablet Take 1 tablet (500 mg total) by mouth daily for 14 days. 14 tablet 0   omeprazole (PRILOSEC) 40 MG capsule Take 1 capsule (40 mg total) by mouth daily for 14 days. 14 capsule 0   PARoxetine (PAXIL) 10 MG tablet Take 1 tablet (10 mg total) by mouth daily for 14 days. 14 tablet 0   albuterol (PROVENTIL) (2.5 MG/3ML) 0.083% nebulizer solution Take 3 mLs by nebulization every 4 (four) hours as needed for wheezing or shortness of breath. 75 mL 0   albuterol (VENTOLIN HFA) 108 (90 Base) MCG/ACT inhaler Inhale 2 puffs into the lungs every 6 (six) hours as needed for wheezing or shortness of breath. 1 each 2   ascorbic acid (VITAMIN C) 500 MG tablet Take 1 tablet (500 mg total) by mouth daily. (Patient not taking: Reported on 03/10/2022)     aspirin EC 81 MG EC tablet Take 1 tablet (81 mg total) by mouth daily. Swallow whole. (Patient not taking: Reported on 01/30/2022) 30 tablet 0   atorvastatin (LIPITOR) 40 MG tablet Take 1 tablet (40 mg total) by mouth daily. (Patient not taking: Reported on 01/30/2022) 30 tablet 0   cetirizine (ZYRTEC ALLERGY) 10 MG tablet Take 1 tablet (10 mg total) by mouth daily. (Patient not taking: Reported on 03/15/2022) 30 tablet 0   cyclobenzaprine (FLEXERIL) 5 MG tablet Take 5 mg by mouth at bedtime as needed for muscle spasms. (Patient not taking: Reported on 03/15/2022)     EPINEPHrine 0.3 mg/0.3 mL IJ SOAJ injection Inject 0.3 mg into the muscle as needed for anaphylaxis.     fluticasone (FLONASE) 50 MCG/ACT nasal spray Place 1 spray into both  nostrils daily. (Patient not taking: Reported on 03/15/2022)     glucosamine-chondroitin 500-400 MG tablet Take 1 tablet by mouth 3 (three) times daily. (Patient not taking: Reported on 03/15/2022)     hydrOXYzine (ATARAX/VISTARIL) 10 MG tablet  Take 10 mg by mouth 3 (three) times daily as needed for itching or anxiety. (Patient not taking: Reported on 03/15/2022)     Multiple Vitamin (MULTIVITAMIN WITH MINERALS) TABS tablet Take 1 tablet by mouth daily. (Patient not taking: Reported on 03/15/2022)     nitroGLYCERIN (NITROSTAT) 0.4 MG SL tablet Place 1 tablet (0.4 mg total) under the tongue every 5 (five) minutes x 3 doses as needed for up to 14 days for chest pain. 30 tablet 0   Sofosbuvir-Velpatasvir 400-100 MG TABS Take 1 tablet by mouth daily. (Patient not taking: Reported on 03/15/2022)     sucralfate (CARAFATE) 1 GM/10ML suspension Take 10 mLs (1 g total) by mouth 4 (four) times daily. (Patient not taking: Reported on 03/10/2022) 420 mL 1   tamsulosin (FLOMAX) 0.4 MG CAPS capsule Take 0.4 mg by mouth daily. (Patient not taking: Reported on 03/15/2022)      Musculoskeletal: Strength & Muscle Tone:  ddi not formally assess, appears wnl Gait & Station:  did not observe Patient leans: N/A  Psychiatric Specialty Exam:  Presentation  General Appearance: Casual  Eye Contact:Fair  Speech:Clear and Coherent  Speech Volume:Normal  Handedness:Right   Mood and Affect  Mood:Euthymic  Affect:Congruent   Thought Process  Thought Processes:Coherent  Descriptions of Associations:Intact  Orientation:Full (Time, Place and Person)  Thought Content:WDL  History of Schizophrenia/Schizoaffective disorder:No  Duration of Psychotic Symptoms:Less than six months  Hallucinations:No data recorded Ideas of Reference:None  Suicidal Thoughts:Suicidal Thoughts: No  Homicidal Thoughts:Homicidal Thoughts: No   Sensorium  Memory:Immediate Good  Judgment:Fair  Insight:Fair   Executive  Functions  Concentration:Fair  Attention Span:Fair  [place]   Psychomotor Activity  Psychomotor Activity:Psychomotor Activity: Normal  Assets  Assets:Desire for Improvement; Resilience   Sleep  Sleep:No data recorded  Physical Exam: Physical Exam Vitals and nursing note reviewed.  HENT:     Head: Normocephalic.     Nose: No congestion or rhinorrhea.  Eyes:     General:        Right eye: No discharge.        Left eye: No discharge.  Cardiovascular:     Rate and Rhythm: Normal rate.  Pulmonary:     Effort: Pulmonary effort is normal.  Musculoskeletal:        General: Normal range of motion.     Cervical back: Normal range of motion.  Skin:    General: Skin is dry.  Neurological:     Mental Status: He is oriented to person, place, and time.  Psychiatric:        Attention and Perception: Attention normal.        Mood and Affect: Mood normal.        Speech: Speech normal.        Behavior: Behavior normal.        Thought Content: Thought content normal. Thought content is not paranoid or delusional. Thought content does not include homicidal or suicidal ideation.        Cognition and Memory: Cognition normal.    Review of Systems  Psychiatric/Behavioral:  Negative for depression, hallucinations, substance abuse and suicidal ideas. The patient is not nervous/anxious.    Blood pressure 132/72, pulse (!) 55, temperature 98.2 F (36.8 C), temperature source Oral, resp. rate 17, SpO2 98 %. There is no height or weight on file to calculate BMI.  Treatment Plan Summary: Plan Patient does not endorse suicidal or homicidal ideations. He does not appear to be an  imminent risk of danger to himself or others.   Disposition: No evidence of imminent risk to self or others at present.   Patient does not meet criteria for psychiatric inpatient admission. Discussed crisis plan, support from social network, calling 911, coming to the  Emergency Department, and calling Suicide Hotline.  Sherlon Handing, NP 03/15/2022 5:43 PM

## 2022-03-15 NOTE — TOC Initial Note (Signed)
Transition of Care [facility]) - Initial/Assessment Note    Patient Details  Name: [name] MRN: [id number] Date of Birth: 1951-06-03  Transition of Care [facility]) CM/SW Contact:    Shelbie Hutching, RN Phone Number: 03/15/2022, 4:38 PM  Clinical Narrative:                 Patient was brought to the emergency room via [facility] PD for paranoia.  Patient has been evaluated by psychiatry and cleared.  Patient has also been medically cleared for discharge.  TOC consult received for homeless resources.  RNCM presented patient with [employer] for [place] [facility] in [place].    Patient reports that he was kicked out of his boarding house a little over a week ago, that is was an illegal boarding house and they took his rent and then kicked him and 2 other guys out.   Patient reports that he went to the [employer] but they told him he could not be on the property.  Patient is on the sex offender registry - he reports that it was a mistake and that someone dropped a computer off with him to fix and get rid of a computer virus, that person never came back, he finally got to the computer, it did have a virus but it also had some very disturbing, illegal pictures and material.  The police looked for the computer owner but he was dead and patient was left with the charges.  Patient understands that being on the registry limits him on housing options.  He reports that he is currently staying in the woods in a tent at an undisclosed location in [place].  He uses public transportation.  He reports he has a PCP visit at [facility] on 7/24 and that they are working on helping him find housing or a care home.  He also reports that they are working on getting him a scooter because he cant walk long distances.     Patient will discharge.  TOC signed off.      Barriers to Discharge: No Barriers Identified   Patient  Goals and CMS Choice        Expected Discharge Plan and Services                                                Prior Living Arrangements/Services                       Activities of Daily Living      Permission Sought/Granted                  Emotional Assessment              Admission diagnosis:  VC Patient Active Problem List   Diagnosis Date Noted   COPD (chronic obstructive pulmonary disease) ([place])    Elevated troponin    Abnormal LFTs    History of hepatitis C    AKI (acute kidney injury) ([place])    Pure hypercholesterolemia    Pneumonia due to COVID-19 virus 04/19/2020   Elevated transaminase level 04/18/2020   Obesity, Class III, BMI 40-49.9 (morbid obesity) ([place]) 04/18/2020   Lactic acidosis 04/17/2020   Thrombocytopenia ([place]) 04/17/2020   Leukopenia 04/17/2020  COVID-19 virus infection 04/16/2020   HTN (hypertension) 04/17/2018   Uncontrolled type 2 diabetes mellitus with hyperglycemia, without long-term current use of insulin ([place]) 04/17/2018   Lymphedema 04/17/2018   Anemia 04/07/2018   Bilateral leg pain 03/29/2018   Chest pain 03/20/2018   Adjustment disorder with mixed anxiety and depressed mood 12/16/2017   Homelessness 12/16/2017   PCP:  Denton Lank, MD Pharmacy:   [facility] [address]), [place]) [place] [address] Phone: [phone] Fax: [phone]     Social Determinants of Health (SDOH) Interventions    Readmission Risk Interventions     No data to display

## 2022-03-15 NOTE — ED Notes (Signed)
Patient provided with meal tray.

## 2022-03-15 NOTE — ED Notes (Signed)
Pt provided a urinal.  

## 2022-03-15 NOTE — ED Triage Notes (Addendum)
Pt arrived via Production manager after pt called 911 stating that people were following him dressed in police outfits. Pt sts he was walking with a "small girl" but sts "they" must have snatched her first, either the government or federal official. Pt also sts he is awaiting his social worker to place him in a facility. Pt is homeless and has been living in conditions that have infested himself and belonging with bed bugs. Pt c/o bilateral cramping in lower and upper extremities. During triage pt sts, "I guess I'm just going to be a ward of this hospital the rest of my life." Pt reports hallucinations and is showing paranoia by reporting that people heard outside of room are laughing at him and talking about him. Pt concerned that he is in a place with "a bunch of other people." Pt is calm and cooperative with disorganized thoughts and answers.   When pt first arrived with officer, officer reported pt wanted to be brought in from bus station due to hallucinations. Pt stated he was seeing "Orbs."

## 2022-03-15 NOTE — ED Notes (Signed)
Pt brought in to [facility] room to de-cloth, shower and change into clean hospital scrubs.

## 2022-03-15 NOTE — Discharge Instructions (Signed)
Your work-up was reassuring.  He had some slight dehydration so make sure you drink plenty of fluid including Gatorade without sugar, Pedialyte you should call your primary care doctor for follow-up and return to the ER if you develop worsening symptoms or any other concern

## 2022-03-15 NOTE — BH Assessment (Signed)
Comprehensive Clinical Assessment (CCA) Screening, Triage and Referral Note  03/15/2022 [name] [id number]  Chief Complaint:  Chief Complaint  Patient presents with   Hallucinations   Visit Diagnosis: Hallucinations  [name] is a 71 year old male who presents to the ER due to fear someone was falling him.  Upon arrival to the ER, it was said he was incoherent and unorganized. However, during the interview the patient was coherent,     During the interview the patient was calm, cooperative, pleasant, coherent, intact and steady flow of thought and clear. He denies SI/HI and AV/H.   Patient Reported Information How did you hear about [place]? Self  What Is the Reason for Your Visit/Call Today? Brought to the ER because he thought people were falling him.  How Long Has This Been Causing You Problems? <Week  What Do You Feel Would Help You the Most Today? Treatment for Depression or other mood problem   Have You Recently Had Any Thoughts About Hurting Yourself? No  Are You Planning to Commit Suicide/Harm Yourself At This time? No   Have you Recently Had Thoughts About [place]? No  Are You Planning to Harm Someone at This Time? No  Explanation: No data recorded  Have You Used Any Alcohol or Drugs in the Past 24 Hours? No  How Long Ago Did You Use Drugs or Alcohol? No data recorded What Did You Use and How Much? No data recorded  Do You Currently Have a Therapist/Psychiatrist? No  Name of Therapist/Psychiatrist: No data recorded  Have You Been Recently Discharged From Any Office Practice or Programs? No  Explanation of Discharge From Practice/Program: No data recorded   CCA Screening Triage Referral Assessment Type of Contact: Face-to-Face  Telemedicine Service Delivery:   Is this Initial or Reassessment? No data recorded Date Telepsych consult ordered in [facility]:  No data recorded Time Telepsych consult ordered in [facility]:  No data recorded Location  of Assessment: [facility] ED  Provider Location: [facility] ED   Collateral Involvement: None provided   Does Patient Have a [place]? No data recorded Name and Contact of Legal Guardian: No data recorded If Minor and Not Living with Parent(s), Who has Custody? n/a  Is CPS involved or ever been involved? Never  Is APS involved or ever been involved? Never   Patient Determined To Be At Risk for Harm To Self or Others Based on Review of Patient Reported Information or Presenting Complaint? No  Method: No data recorded Availability of Means: No data recorded Intent: No data recorded Notification Required: No data recorded Additional Information for Danger to Others Potential: No data recorded Additional Comments for Danger to Others Potential: No data recorded Are There Guns or Other Weapons in Your Home? No data recorded Types of Guns/Weapons: No data recorded Are These Weapons Safely Secured?                            No data recorded Who Could Verify You Are Able To Have These Secured: No data recorded Do You Have any Outstanding Charges, Pending Court Dates, Parole/Probation? No data recorded Contacted To Inform of Risk of Harm To Self or Others: Family/Significant Other:   Does Patient Present under Involuntary Commitment? No  IVC Papers Initial File Date: No data recorded  [place] of Residence: [place]   Patient Currently Receiving the Following Services: Not Receiving Services   Determination of Need: Emergent (  2 hours)   Options For Referral: ED Visit   Discharge Disposition:    Gunnar Fusi MS, LCAS, [facility], [place] Therapeutic Triage Specialist 03/15/2022 10:19 AM
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
## 2022-03-21 ENCOUNTER — Emergency Department
Admission: EM | Admit: 2022-03-21 | Discharge: 2022-03-21 | Disposition: A | Discharge status: Home / Self Care | Payer: Medicare Other | Attending: Emergency Medicine | Admitting: Emergency Medicine

## 2022-03-21 ENCOUNTER — Emergency Department: Payer: Medicare Other

## 2022-03-21 ENCOUNTER — Other Ambulatory Visit: Payer: Self-pay

## 2022-03-21 DIAGNOSIS — I509 Heart failure, unspecified: Secondary | ICD-10-CM | POA: Diagnosis not present

## 2022-03-21 DIAGNOSIS — E119 Type 2 diabetes mellitus without complications: Secondary | ICD-10-CM | POA: Insufficient documentation

## 2022-03-21 DIAGNOSIS — R600 Localized edema: Secondary | ICD-10-CM | POA: Insufficient documentation

## 2022-03-21 DIAGNOSIS — Z8616 Personal history of COVID-19: Secondary | ICD-10-CM | POA: Insufficient documentation

## 2022-03-21 DIAGNOSIS — R55 Syncope and collapse: Secondary | ICD-10-CM | POA: Diagnosis present

## 2022-03-21 DIAGNOSIS — Z5900 Homelessness unspecified: Secondary | ICD-10-CM | POA: Diagnosis not present

## 2022-03-21 DIAGNOSIS — I110 Hypertensive heart disease with heart failure: Secondary | ICD-10-CM | POA: Diagnosis not present

## 2022-03-21 DIAGNOSIS — J449 Chronic obstructive pulmonary disease, unspecified: Secondary | ICD-10-CM | POA: Insufficient documentation

## 2022-03-21 LAB — COMPREHENSIVE METABOLIC PANEL
ALT: 47 U/L — ABNORMAL HIGH (ref 0–44)
AST: 71 U/L — ABNORMAL HIGH (ref 15–41)
Albumin: 2.7 g/dL — ABNORMAL LOW (ref 3.5–5.0)
Alkaline Phosphatase: 62 U/L (ref 38–126)
Anion gap: 7 (ref 5–15)
BUN: 8 mg/dL (ref 8–23)
CO2: 25 mmol/L (ref 22–32)
Calcium: 9.3 mg/dL (ref 8.9–10.3)
Chloride: 104 mmol/L (ref 98–111)
Creatinine, Ser: 1.15 mg/dL (ref 0.61–1.24)
GFR, Estimated: >60 mL/min (ref >60)
Glucose, Bld: 130 mg/dL — ABNORMAL HIGH (ref 70–99)
Potassium: 3.7 mmol/L (ref 3.5–5.1)
Sodium: 136 mmol/L (ref 135–145)
Total Bilirubin: 2.4 mg/dL — ABNORMAL HIGH (ref 0.3–1.2)
Total Protein: 6.3 g/dL — ABNORMAL LOW (ref 6.5–8.1)

## 2022-03-21 LAB — CBC
HCT: 33.2 % — ABNORMAL LOW (ref 39.0–52.0)
Hemoglobin: 10.7 g/dL — ABNORMAL LOW (ref 13.0–17.0)
MCH: 30.3 pg (ref 26.0–34.0)
MCHC: 32.2 g/dL (ref 30.0–36.0)
MCV: 94.1 fL (ref 80.0–100.0)
Platelets: 87 10*3/uL — ABNORMAL LOW (ref 150–400)
RBC: 3.53 MIL/uL — ABNORMAL LOW (ref 4.22–5.81)
RDW: 15.2 % (ref 11.5–15.5)
WBC: 3 10*3/uL — ABNORMAL LOW (ref 4.0–10.5)
nRBC: 0 % (ref 0.0–0.2)

## 2022-03-21 LAB — TROPONIN I (HIGH SENSITIVITY): Troponin I (High Sensitivity): 16 ng/L (ref <18)

## 2022-03-21 LAB — BRAIN NATRIURETIC PEPTIDE: B Natriuretic Peptide: 361.4 pg/mL — ABNORMAL HIGH (ref 0.0–100.0)

## 2022-03-21 NOTE — ED Triage Notes (Signed)
Pt here with SOB since yesterday evening. Pt state he also had a period of nausea. Pt also having CP that radiates to his back.

## 2022-03-21 NOTE — Discharge Instructions (Addendum)
Please follow-up with cardiology for evaluation of your syncope.  If you develop any new symptoms such as worsening chest pain or shortness of breath please return to the emergency department.
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
## 2022-03-21 NOTE — ED Provider Triage Note (Signed)
Emergency Medicine Provider Triage Evaluation Note  [name], a 71 y.o. male  was evaluated in triage.  Pt complains of shortness of breath, intermittent nausea as well as some mild chest pain radiating to the back.  Patient is on home, and spends his days at the [employer] to avoid the heat and humidity.  Review of Systems  Positive: SOB, CP  Negative: FCS  Physical Exam  BP 126/64   Pulse 65   Temp 98.5 F (36.9 C) (Oral)   Resp 20   Ht '5\' 6"'$  (1.676 m)   Wt 125.2 kg   SpO2 100%   BMI 44.55 kg/m  Gen:   Awake, no distress   Resp:  Normal effort  MSK:   Moves extremities without difficulty  Other:    Medical Decision Making  Medically screening exam initiated at 3:52 PM.  Appropriate orders placed.  [name] was informed that the remainder of the evaluation will be completed by another provider, this initial triage assessment does not replace that evaluation, and the importance of remaining in the ED until their evaluation is complete.  Male to the ED for evaluation of shortness of breath with some intermittent nausea and chest pain since yesterday.   Melvenia Needles, PA-C 03/21/22 1554

## 2022-03-21 NOTE — ED Notes (Signed)
Pt A&O, pt given discharge instructions, pt ambulating steady with walking device.

## 2022-03-24 ENCOUNTER — Inpatient Hospital Stay
Admission: EM | Admit: 2022-03-24 | Discharge: 2022-03-29 | DRG: 871 | Disposition: A | Discharge status: Home / Self Care | Payer: Medicare Other | Attending: Internal Medicine | Admitting: Internal Medicine

## 2022-03-24 ENCOUNTER — Emergency Department: Payer: Medicare Other

## 2022-03-24 ENCOUNTER — Other Ambulatory Visit: Payer: Self-pay

## 2022-03-24 DIAGNOSIS — I110 Hypertensive heart disease with heart failure: Secondary | ICD-10-CM | POA: Diagnosis present

## 2022-03-24 DIAGNOSIS — D649 Anemia, unspecified: Secondary | ICD-10-CM | POA: Diagnosis not present

## 2022-03-24 DIAGNOSIS — Z20822 Contact with and (suspected) exposure to covid-19: Secondary | ICD-10-CM | POA: Diagnosis present

## 2022-03-24 DIAGNOSIS — Z5900 Homelessness unspecified: Secondary | ICD-10-CM

## 2022-03-24 DIAGNOSIS — Z87891 Personal history of nicotine dependence: Secondary | ICD-10-CM

## 2022-03-24 DIAGNOSIS — R7881 Bacteremia: Secondary | ICD-10-CM | POA: Diagnosis present

## 2022-03-24 DIAGNOSIS — I890 Lymphedema, not elsewhere classified: Secondary | ICD-10-CM | POA: Diagnosis present

## 2022-03-24 DIAGNOSIS — G4730 Sleep apnea, unspecified: Secondary | ICD-10-CM | POA: Diagnosis present

## 2022-03-24 DIAGNOSIS — E11649 Type 2 diabetes mellitus with hypoglycemia without coma: Secondary | ICD-10-CM | POA: Diagnosis not present

## 2022-03-24 DIAGNOSIS — E162 Hypoglycemia, unspecified: Secondary | ICD-10-CM | POA: Diagnosis not present

## 2022-03-24 DIAGNOSIS — J440 Chronic obstructive pulmonary disease with acute lower respiratory infection: Secondary | ICD-10-CM | POA: Diagnosis present

## 2022-03-24 DIAGNOSIS — F1110 Opioid abuse, uncomplicated: Secondary | ICD-10-CM | POA: Diagnosis present

## 2022-03-24 DIAGNOSIS — R55 Syncope and collapse: Secondary | ICD-10-CM | POA: Diagnosis not present

## 2022-03-24 DIAGNOSIS — Z8249 Family history of ischemic heart disease and other diseases of the circulatory system: Secondary | ICD-10-CM

## 2022-03-24 DIAGNOSIS — Z882 Allergy status to sulfonamides status: Secondary | ICD-10-CM

## 2022-03-24 DIAGNOSIS — F4323 Adjustment disorder with mixed anxiety and depressed mood: Secondary | ICD-10-CM | POA: Diagnosis not present

## 2022-03-24 DIAGNOSIS — Z7984 Long term (current) use of oral hypoglycemic drugs: Secondary | ICD-10-CM

## 2022-03-24 DIAGNOSIS — M7989 Other specified soft tissue disorders: Secondary | ICD-10-CM | POA: Diagnosis present

## 2022-03-24 DIAGNOSIS — T502X6A Underdosing of carbonic-anhydrase inhibitors, benzothiadiazides and other diuretics, initial encounter: Secondary | ICD-10-CM | POA: Diagnosis present

## 2022-03-24 DIAGNOSIS — I5032 Chronic diastolic (congestive) heart failure: Secondary | ICD-10-CM | POA: Diagnosis present

## 2022-03-24 DIAGNOSIS — E7800 Pure hypercholesterolemia, unspecified: Secondary | ICD-10-CM | POA: Diagnosis present

## 2022-03-24 DIAGNOSIS — Z6841 Body Mass Index (BMI) 40.0 and over, adult: Secondary | ICD-10-CM

## 2022-03-24 DIAGNOSIS — B1920 Unspecified viral hepatitis C without hepatic coma: Secondary | ICD-10-CM | POA: Diagnosis present

## 2022-03-24 DIAGNOSIS — J449 Chronic obstructive pulmonary disease, unspecified: Secondary | ICD-10-CM | POA: Diagnosis present

## 2022-03-24 DIAGNOSIS — Z8616 Personal history of COVID-19: Secondary | ICD-10-CM

## 2022-03-24 DIAGNOSIS — R509 Fever, unspecified: Secondary | ICD-10-CM

## 2022-03-24 DIAGNOSIS — Z7982 Long term (current) use of aspirin: Secondary | ICD-10-CM

## 2022-03-24 DIAGNOSIS — E1165 Type 2 diabetes mellitus with hyperglycemia: Secondary | ICD-10-CM

## 2022-03-24 DIAGNOSIS — J432 Centrilobular emphysema: Secondary | ICD-10-CM | POA: Diagnosis not present

## 2022-03-24 DIAGNOSIS — R6520 Severe sepsis without septic shock: Secondary | ICD-10-CM | POA: Diagnosis present

## 2022-03-24 DIAGNOSIS — J189 Pneumonia, unspecified organism: Secondary | ICD-10-CM

## 2022-03-24 DIAGNOSIS — B961 Klebsiella pneumoniae [K. pneumoniae] as the cause of diseases classified elsewhere: Secondary | ICD-10-CM | POA: Diagnosis present

## 2022-03-24 DIAGNOSIS — A419 Sepsis, unspecified organism: Secondary | ICD-10-CM | POA: Diagnosis present

## 2022-03-24 DIAGNOSIS — A4159 Other Gram-negative sepsis: Principal | ICD-10-CM | POA: Diagnosis present

## 2022-03-24 DIAGNOSIS — D696 Thrombocytopenia, unspecified: Secondary | ICD-10-CM | POA: Diagnosis present

## 2022-03-24 DIAGNOSIS — G43909 Migraine, unspecified, not intractable, without status migrainosus: Secondary | ICD-10-CM | POA: Diagnosis present

## 2022-03-24 DIAGNOSIS — E876 Hypokalemia: Secondary | ICD-10-CM

## 2022-03-24 DIAGNOSIS — Z888 Allergy status to other drugs, medicaments and biological substances status: Secondary | ICD-10-CM

## 2022-03-24 DIAGNOSIS — Z79899 Other long term (current) drug therapy: Secondary | ICD-10-CM

## 2022-03-24 DIAGNOSIS — J150 Pneumonia due to Klebsiella pneumoniae: Secondary | ICD-10-CM | POA: Diagnosis present

## 2022-03-24 DIAGNOSIS — I10 Essential (primary) hypertension: Secondary | ICD-10-CM | POA: Diagnosis present

## 2022-03-24 DIAGNOSIS — E66813 Obesity, class 3: Secondary | ICD-10-CM | POA: Diagnosis present

## 2022-03-24 LAB — BASIC METABOLIC PANEL
Anion gap: 7 (ref 5–15)
BUN: 11 mg/dL (ref 8–23)
CO2: 24 mmol/L (ref 22–32)
Calcium: 9.2 mg/dL (ref 8.9–10.3)
Chloride: 105 mmol/L (ref 98–111)
Creatinine, Ser: 1.22 mg/dL (ref 0.61–1.24)
GFR, Estimated: >60 mL/min (ref >60)
Glucose, Bld: 125 mg/dL — ABNORMAL HIGH (ref 70–99)
Potassium: 3.4 mmol/L — ABNORMAL LOW (ref 3.5–5.1)
Sodium: 136 mmol/L (ref 135–145)

## 2022-03-24 LAB — CBC
HCT: 33.4 % — ABNORMAL LOW (ref 39.0–52.0)
Hemoglobin: 10.9 g/dL — ABNORMAL LOW (ref 13.0–17.0)
MCH: 30.1 pg (ref 26.0–34.0)
MCHC: 32.6 g/dL (ref 30.0–36.0)
MCV: 92.3 fL (ref 80.0–100.0)
Platelets: 117 10*3/uL — ABNORMAL LOW (ref 150–400)
RBC: 3.62 MIL/uL — ABNORMAL LOW (ref 4.22–5.81)
RDW: 14.9 % (ref 11.5–15.5)
WBC: 4.9 10*3/uL (ref 4.0–10.5)
nRBC: 0 % (ref 0.0–0.2)

## 2022-03-24 LAB — MAGNESIUM: Magnesium: 1.6 mg/dL — ABNORMAL LOW (ref 1.7–2.4)

## 2022-03-24 LAB — CK: Total CK: 614 U/L — ABNORMAL HIGH (ref 49–397)

## 2022-03-24 LAB — LACTIC ACID, PLASMA
Lactic Acid, Venous: 2.5 mmol/L (ref 0.5–1.9)
Lactic Acid, Venous: 1.8 mmol/L (ref 0.5–1.9)

## 2022-03-24 LAB — PROCALCITONIN: Procalcitonin: 0.11 ng/mL

## 2022-03-24 MED ORDER — ENOXAPARIN SODIUM 40 MG/0.4ML IJ SOSY
40.0000 mg | PREFILLED_SYRINGE | INTRAMUSCULAR | Status: DC
Start: 2022-03-24 — End: 2022-03-26
  Administered 2022-03-25 (×2): 40 mg via SUBCUTANEOUS
  Filled 2022-03-24 (×2): qty 0.4

## 2022-03-24 MED ORDER — SODIUM CHLORIDE 0.9% FLUSH
3.0000 mL | Freq: Two times a day (BID) | INTRAVENOUS | Status: DC
  Administered 2022-03-25 – 2022-03-29 (×10): 3 mL via INTRAVENOUS

## 2022-03-24 MED ORDER — PAROXETINE HCL 10 MG PO TABS
10.0000 mg | ORAL_TABLET | Freq: Every day | ORAL | Status: DC
  Administered 2022-03-25 – 2022-03-29 (×5): 10 mg via ORAL
  Filled 2022-03-24 (×5): qty 1

## 2022-03-24 MED ORDER — ALBUTEROL SULFATE (2.5 MG/3ML) 0.083% IN NEBU
3.0000 mL | INHALATION_SOLUTION | Freq: Four times a day (QID) | RESPIRATORY_TRACT | Status: DC | PRN
Start: 2022-03-24 — End: 2022-03-29

## 2022-03-24 MED ORDER — METRONIDAZOLE 500 MG/100ML IV SOLN
500.0000 mg | Freq: Once | INTRAVENOUS | Status: AC
  Administered 2022-03-24: 500 mg via INTRAVENOUS
  Filled 2022-03-24: qty 100

## 2022-03-24 MED ORDER — VANCOMYCIN HCL IN DEXTROSE 1-5 GM/200ML-% IV SOLN
1000.0000 mg | Freq: Once | INTRAVENOUS | Status: AC
  Administered 2022-03-24: 1000 mg via INTRAVENOUS
  Filled 2022-03-24: qty 200

## 2022-03-24 MED ORDER — ACETAMINOPHEN 325 MG PO TABS
650.0000 mg | ORAL_TABLET | Freq: Four times a day (QID) | ORAL | Status: DC | PRN
  Administered 2022-03-28: 650 mg via ORAL
  Filled 2022-03-24: qty 2

## 2022-03-24 MED ORDER — POTASSIUM CHLORIDE CRYS ER 20 MEQ PO TBCR
40.0000 meq | EXTENDED_RELEASE_TABLET | Freq: Once | ORAL | Status: AC
Start: 2022-03-24 — End: 2022-03-25
  Administered 2022-03-25: 40 meq via ORAL
  Filled 2022-03-24: qty 2

## 2022-03-24 MED ORDER — POLYETHYLENE GLYCOL 3350 17 G PO PACK
17.0000 g | PACK | Freq: Every day | ORAL | Status: DC | PRN
Start: 2022-03-24 — End: 2022-03-29

## 2022-03-24 MED ORDER — ACETAMINOPHEN 500 MG PO TABS
1000.0000 mg | ORAL_TABLET | Freq: Once | ORAL | Status: AC
  Administered 2022-03-24: 1000 mg via ORAL
  Filled 2022-03-24: qty 2

## 2022-03-24 MED ORDER — LISINOPRIL 20 MG PO TABS
30.0000 mg | ORAL_TABLET | Freq: Every day | ORAL | Status: DC
  Administered 2022-03-26 – 2022-03-29 (×4): 30 mg via ORAL
  Filled 2022-03-24 (×4): qty 1

## 2022-03-24 MED ORDER — SODIUM CHLORIDE 0.9 % IV SOLN
2.0000 g | Freq: Once | INTRAVENOUS | Status: AC
  Administered 2022-03-24: 2 g via INTRAVENOUS
  Filled 2022-03-24: qty 12.5

## 2022-03-24 MED ORDER — PANTOPRAZOLE SODIUM 40 MG PO TBEC
40.0000 mg | DELAYED_RELEASE_TABLET | Freq: Every day | ORAL | Status: DC
  Administered 2022-03-25 – 2022-03-29 (×5): 40 mg via ORAL
  Filled 2022-03-24 (×5): qty 1

## 2022-03-24 MED ORDER — ACETAMINOPHEN 650 MG RE SUPP: 650.0000 mg | Freq: Four times a day (QID) | RECTAL | Status: DC | PRN

## 2022-03-24 MED ORDER — LACTATED RINGERS IV BOLUS
1000.0000 mL | Freq: Once | INTRAVENOUS | Status: AC
  Administered 2022-03-24: 1000 mL via INTRAVENOUS

## 2022-03-24 MED ORDER — EPINEPHRINE 0.3 MG/0.3ML IJ SOAJ
0.3000 mg | INTRAMUSCULAR | Status: DC | PRN
Start: 2022-03-24 — End: 2022-03-24

## 2022-03-24 NOTE — H&P (Addendum)
History and Physical   LOREN SAWAYA MWU:[id number] DOB: April 01, 1951 DOA: 03/24/2022  PCP: Denton Lank, MD   Patient coming from: Home  Chief Complaint: Syncope, fever, loss of consciousness  HPI: [name] is a 71 y.o. male with medical history significant of hypertension, hyperlipidemia, thrombocytopenia, transaminitis, hepatitis C, anxiety, depression, diabetes, anemia, lymphedema, obesity, COPD presenting with episode of loss of consciousness/syncope and fever.  Patient reportedly found down by a bystander.  He has been found standing in the sun he reports he passed out earlier today.  He stated felt well this morning and went for a walk later in the day and tripped over a curb as best he could remember.  Of note, he was in the ED for syncope 3 days ago, reporting several episodes without prodrome.  He also reports some mild increase shortness of breath last few days and possibly increase in his chronic edema.  He denies fevers, chills, chest pain, shortness of breath, abdominal pain, constipation, diarrhea, nausea, vomiting.  ED Course: Vital signs in the ED significant for fever to 103.1, blood pressure in the 725D to 664 systolic.  Respiratory rate in the teens to 20s.  On 2 L.  Lab work-up included BMP with potassium 3.4, glucose 125.  CBC with hemoglobin stable at 10.9, platelets 117.  Lactic acid initially mildly elevated 2.5 with repeat pending.  CK mildly elevated greater than 600.  Urinalysis and blood cultures pending.  Right hand x-ray showed no fracture and no foreign body.  Chest x-ray show new diffuse opacities representing edema versus atypical infection.  Patient received vancomycin, cefepime, Flagyl in the ED as well as Tylenol and a liter of fluid.  Review of Systems: As per HPI otherwise all other systems reviewed and are negative.  Past Medical History:  Diagnosis Date   Asthma    CHF (congestive heart failure) (HCC)    COPD (chronic obstructive pulmonary  disease) ([place])    COVID-19 03/2020   diagnosed in August 2021   Diabetes mellitus without complication ([place])    Homelessness    Hypertension    Infestation by bed bug    Migraine    Obesity    Sleep apnea     Past Surgical History:  Procedure Laterality Date   CARDIAC CATHETERIZATION     CHOLECYSTECTOMY     EYE SURGERY     INNER EAR SURGERY     NOSE SURGERY      Social History  reports that he has quit smoking. He has never used smokeless tobacco. He reports that he does not drink alcohol and does not use drugs.  Allergies  Allergen Reactions   Novocain [Procaine]    Sulfa Antibiotics Other (See Comments)    Family History  Problem Relation Age of Onset   Hypertension Mother    Heart disease Mother    Heart failure Maternal Grandmother   Reviewed on admission  Prior to Admission medications   Medication Sig Start Date End Date Taking? Authorizing Provider  albuterol (PROVENTIL) (2.5 MG/3ML) 0.083% nebulizer solution Take 3 mLs by nebulization every 4 (four) hours as needed for wheezing or shortness of breath. 03/10/22 04/09/22  Vanessa Powhatan, MD  albuterol (VENTOLIN HFA) 108 (90 Base) MCG/ACT inhaler Inhale 2 puffs into the lungs every 6 (six) hours as needed for wheezing or shortness of breath. 03/10/22 04/09/22  Vanessa Starbuck, MD  ascorbic acid (VITAMIN C) 500 MG tablet Take 1 tablet (500 mg total) by mouth daily. Patient  not taking: Reported on 03/10/2022 04/20/20   Samuella Cota, MD  aspirin EC 81 MG EC tablet Take 1 tablet (81 mg total) by mouth daily. Swallow whole. Patient not taking: Reported on 01/30/2022 10/06/21   Aline August, MD  atorvastatin (LIPITOR) 40 MG tablet Take 1 tablet (40 mg total) by mouth daily. Patient not taking: Reported on 01/30/2022 10/05/21   Aline August, MD  buprenorphine-naloxone (SUBOXONE) 2-0.5 mg SUBL SL tablet Place 1 tablet under the tongue in the morning and at bedtime. [name]    [provider]  cetirizine (ZYRTEC  ALLERGY) 10 MG tablet Take 1 tablet (10 mg total) by mouth daily. Patient not taking: Reported on 03/15/2022 07/04/20   Blake Divine, MD  cyclobenzaprine (FLEXERIL) 5 MG tablet Take 5 mg by mouth at bedtime as needed for muscle spasms. Patient not taking: Reported on 03/15/2022    [provider]  EPINEPHrine 0.3 mg/0.3 mL IJ SOAJ injection Inject 0.3 mg into the muscle as needed for anaphylaxis.    [provider]  fluticasone (FLONASE) 50 MCG/ACT nasal spray Place 1 spray into both nostrils daily. Patient not taking: Reported on 03/15/2022    [provider]  furosemide (LASIX) 80 MG tablet Take 1 tablet (80 mg total) by mouth daily for 14 days. 03/10/22 03/24/22  Vanessa [place], MD  glucosamine-chondroitin 500-400 MG tablet Take 1 tablet by mouth 3 (three) times daily. Patient not taking: Reported on 03/15/2022    [provider]  hydrOXYzine (ATARAX/VISTARIL) 10 MG tablet Take 10 mg by mouth 3 (three) times daily as needed for itching or anxiety. Patient not taking: Reported on 03/15/2022    [provider]  JARDIANCE 10 MG TABS tablet Take 1 tablet (10 mg total) by mouth daily for 14 days. 03/10/22 03/24/22  Vanessa [place], MD  lisinopril (ZESTRIL) 10 MG tablet Take 3 tablets (30 mg total) by mouth daily for 14 days. 03/10/22 03/24/22  Vanessa [place], MD  metFORMIN (GLUCOPHAGE-XR) 500 MG 24 hr tablet Take 1 tablet (500 mg total) by mouth daily for 14 days. 03/10/22 03/24/22  Vanessa Bryant, MD  Multiple Vitamin (MULTIVITAMIN WITH MINERALS) TABS tablet Take 1 tablet by mouth daily. Patient not taking: Reported on 03/15/2022    [provider]  nitroGLYCERIN (NITROSTAT) 0.4 MG SL tablet Place 1 tablet (0.4 mg total) under the tongue every 5 (five) minutes x 3 doses as needed for up to 14 days for chest pain. 03/10/22 03/24/22  Vanessa [place], MD  omeprazole (PRILOSEC) 40 MG capsule Take 1 capsule (40 mg total) by mouth daily for 14 days. 03/10/22 03/24/22  Vanessa Hardin, MD  PARoxetine (PAXIL) 10 MG tablet Take 1 tablet (10 mg total) by mouth daily for 14 days. 03/10/22 03/24/22  Vanessa , MD  Sofosbuvir-Velpatasvir 400-100 MG TABS Take 1 tablet by mouth daily. Patient not taking: Reported on 03/15/2022    [provider]  sucralfate (CARAFATE) 1 GM/10ML suspension Take 10 mLs (1 g total) by mouth 4 (four) times daily. Patient not taking: Reported on 03/10/2022 12/01/21   Merlyn Lot, MD  tamsulosin (FLOMAX) 0.4 MG CAPS capsule Take 0.4 mg by mouth daily. Patient not taking: Reported on 03/15/2022    [provider]    Physical Exam: Vitals:   03/24/22 2100 03/24/22 2140 03/24/22 2200 03/24/22 2229  BP:  133/75 110/75   Pulse: 88 82 79   Resp: 18 (!) 22 (!) 23   Temp:    Marland Kitchen)  100.5 F (38.1 C)  TempSrc:    Oral  SpO2: 94% 97% 99%   Weight:      Height:        Physical Exam Constitutional:      General: He is not in acute distress.    Appearance: He is obese.     Comments: Disheveled and tired appearing, drowsy but easily arousable and appropriate.  HENT:     Head: Normocephalic and atraumatic.     Mouth/Throat:     Mouth: Mucous membranes are moist.     Pharynx: Oropharynx is clear.  Eyes:     Extraocular Movements: Extraocular movements intact.     Pupils: Pupils are equal, round, and reactive to light.  Cardiovascular:     Rate and Rhythm: Normal rate and regular rhythm.     Pulses: Normal pulses.     Heart sounds: Normal heart sounds.  Pulmonary:     Effort: Pulmonary effort is normal. No respiratory distress.     Breath sounds: Normal breath sounds.  Abdominal:     General: Bowel sounds are normal. There is no distension.     Palpations: Abdomen is soft.     Tenderness: There is no abdominal tenderness.  Musculoskeletal:        General: No swelling or deformity.     Right lower leg: Edema present.     Left lower leg: Edema present.  Skin:    General: Skin is warm and dry.     Findings: Erythema  present.     Comments: Chronic skin changes of bilateral lower extremities. Abrasion on right hand and elbow.  Neurological:     General: No focal deficit present.     Mental Status: Mental status is at baseline.    Labs on Admission: I have personally reviewed following labs and imaging studies  CBC: Recent Labs  Lab 03/21/22 1218 03/24/22 1927  WBC 3.0* 4.9  HGB 10.7* 10.9*  HCT 33.2* 33.4*  MCV 94.1 92.3  PLT 87* 117*    Basic Metabolic Panel: Recent Labs  Lab 03/21/22 1218 03/24/22 1927 03/24/22 2030  NA 136 136  --   K 3.7 3.4*  --   CL 104 105  --   CO2 25 24  --   GLUCOSE 130* 125*  --   BUN 8 11  --   CREATININE 1.15 1.22  --   CALCIUM 9.3 9.2  --   MG  --   --  1.6*    GFR: Estimated Creatinine Clearance: 68.3 mL/min (by C-G formula based on SCr of 1.22 mg/dL).  Liver Function Tests: Recent Labs  Lab 03/21/22 1218  AST 71*  ALT 47*  ALKPHOS 62  BILITOT 2.4*  PROT 6.3*  ALBUMIN 2.7*    Urine analysis:    Component Value Date/Time   COLORURINE AMBER (A) 03/15/2022 0332   APPEARANCEUR HAZY (A) 03/15/2022 0332   LABSPEC 1.025 03/15/2022 0332   PHURINE 5.0 03/15/2022 0332   GLUCOSEU NEGATIVE 03/15/2022 0332   HGBUR NEGATIVE 03/15/2022 0332   BILIRUBINUR NEGATIVE 03/15/2022 0332   KETONESUR 5 (A) 03/15/2022 0332   PROTEINUR 100 (A) 03/15/2022 0332   NITRITE NEGATIVE 03/15/2022 0332   LEUKOCYTESUR NEGATIVE 03/15/2022 0332    Radiological Exams on Admission: DG Chest Portable 1 View  Result Date: 03/24/2022 CLINICAL DATA:  Fever. EXAM: PORTABLE CHEST 1 VIEW COMPARISON:  Chest radiograph 03/21/2022; CT angio chest 03/18/2020. FINDINGS: Stable cardiomediastinal silhouette. Mild tortuosity of the thoracic aorta with aortic  calcifications. New diffuse interstitial opacities. No focal consolidation, large volume pleural effusion, or pneumothorax. Visualized osseous structures are unremarkable. IMPRESSION: New diffuse interstitial opacities may  reflect mild pulmonary edema or possibly atypical infection in the appropriate clinical setting. Electronically Signed   By: Ileana Roup M.D.   On: 03/24/2022 21:16   DG Hand Complete Right  Result Date: 03/24/2022 CLINICAL DATA:  Syncope.  Abrasion to left pinky finger. EXAM: RIGHT HAND - COMPLETE 3+ VIEW COMPARISON:  None Available. FINDINGS: No fractures or dislocations. The fifth finger is flexed at the distal interphalangeal joint. No fractures. IMPRESSION: No fractures or foreign bodies. The fifth finger is flexed at the distal interphalangeal joint. This is an age-indeterminate finding. Recommend clinical correlation. Electronically Signed   By: Dorise Bullion III M.D.   On: 03/24/2022 20:02    EKG: Independently reviewed.  Sinus rhythm at 84 bpm.  Early transition of R wave progression.  QRS 106 ms, appears to be early left bundle branch block.  Similar to previous.  Assessment/Plan Principal Problem:   Syncope and collapse Active Problems:   Adjustment disorder with mixed anxiety and depressed mood   Anemia   HTN (hypertension)   Uncontrolled type 2 diabetes mellitus with hyperglycemia, without long-term current use of insulin (HCC)   Obesity, Class III, BMI 40-49.9 (morbid obesity) (HCC)   COPD (chronic obstructive pulmonary disease) (HCC)   Pure hypercholesterolemia   Hypokalemia   Fever   Syncope > Patient presenting after being found standing in the sun reportedly had passed out today.  Also had presented earlier in the week for syncope. > Remembers feeling okay earlier today and going for a walk and tripping over a curve.  He reports he passed out.  No prodrome reported. > During previous encounter he had reported sudden loss of consciousness without prodrome leading to "falling out ".  Reportedly episode started after he hit his head. > Concerning for possible cardiogenic syncope. - Monitor on telemetry - Echocardiogram - Check orthostatic vital signs - Trend  electrolytes  Fever Pneumonia ?Early cellulitis > Patient presenting with fever however he had been out in the sun but fever has persisted as high as 103.1 in the ED.  No leukocytosis.  Does have evidence of possible new pneumonia versus edema on chest x-ray. (Given he was out in the sun today and does not appear to be volume overloaded do not suspect this is edema) > Low suspicion for bacterial etiology as there is no leukocytosis.  Prior to labs come back did receive vancomycin, cefepime, Flagyl in the ED. > Could be viral etiology we will check for COVID as well as full respiratory viral panel. > Patient does also have chronic lymphedema with some skin changes hard to tell to what extent some of these may be chronic versus new however this time presumably chronic.  If no other etiology found may need to consider cellulitis. - Monitor on telemetry - Check COVID screen and full respiratory viral panel for etiology for atypical pneumonia - Trend fever curve and WBC - Contact and airborne precautions  ?CHF > Patient is reporting history of CHF however this is not in his chart and echo in 2022 showed normal EF and no diastolic dysfunction.  Is scribed diuretic however. > If BNP elevated this could be a condition for chest x-ray findings as opposed to atypical pneumonia. - Check BNP - Ins and outs, daily weights - Rechecking echocardiogram for syncope as above  Hypokalemia > Mild hypokalemia at  3.4 in the ED. - 40 mill colons p.o. potassium - Check magnesium - Repeat potassium tomorrow  Hypertension - Continue home lisinopril - Holding home Lasix for now  Depression Anxiety - Continue home Paxil  Diabetes - SSI  COPD - Continue home as needed albuterol   Anemia > Hemoglobin stable at 10.9 - Trend CBC  Hyperlipidemia History of hepatitis C Obesity - Noted, not currently on any cholesterol medication   Opioid use disorder > Has Suboxone on his chart but no diagnosis of  opioid use. > Somewhat somnolent when seen, will hold off on Suboxone for now  DVT prophylaxis: Lovenox Code Status:   Full Family Communication:  None on admission Disposition Plan:   Patient is from:  Home  Anticipated DC to:  Home  Anticipated DC date:  1 to 3 days  Anticipated DC barriers: None/homeless  Consults called:  None Admission status:  Observation, telemetry  Severity of Illness: The appropriate patient status for this patient is OBSERVATION. Observation status is judged to be reasonable and necessary in order to provide the required intensity of service to ensure the patient's safety. The patient's presenting symptoms, physical exam findings, and initial radiographic and laboratory data in the context of their medical condition is felt to place them at decreased risk for further clinical deterioration. Furthermore, it is anticipated that the patient will be medically stable for discharge from the hospital within 2 midnights of admission.    Marcelyn Bruins MD [facility]  How to contact the [facility] Attending or Consulting provider [place] or covering provider during after hours [place], for this patient?   Check the care team in [facility] and look for a) attending/consulting [facility] provider listed and b) the [facility] team listed Log into [email or web address] and use [place] universal password to access. If you do not have the password, please contact the hospital operator. Locate the [facility] provider you are looking for under [facility] and page to a number that you can be directly reached. If you still have difficulty reaching the provider, please page the [facility] (Director on Call) for the Hospitalists listed on amion for assistance.  03/24/2022, 11:00 PM

## 2022-03-24 NOTE — ED Triage Notes (Signed)
Bystander found pt standing out in sun, syncopal episode. Abrasion to left pinky. Oral temp 104.8

## 2022-03-24 NOTE — ED Notes (Signed)
ED Provider at bedside. 

## 2022-03-24 NOTE — ED Provider Notes (Signed)
[facility] Provider Note    Event Date/Time   First MD Initiated Contact with Patient 03/24/22 2010     (approximate)   History   Loss of Consciousness   HPI  [name] is a 71 y.o. male who presents to the emergency department today after being found standing out in the sun.  Apparently the patient passed out.  Patient states that he remembers going out for a walk and then he thinks he tripped on a curb.  He did state that he felt fine earlier today.  The patient does not any endorse any recent illness although was seen in the emergency department 3 days ago for syncope.   Physical Exam   Triage Vital Signs: ED Triage Vitals  Enc Vitals Group     BP 03/24/22 1928 (!) 136/42     Pulse Rate 03/24/22 1924 84     Resp 03/24/22 1924 18     Temp 03/24/22 1925 (!) 103.1 F (39.5 C)     Temp Source 03/24/22 1925 Oral     SpO2 03/24/22 1924 92 %     Weight 03/24/22 1925 275 lb (124.7 kg)     Height 03/24/22 1925 '5\' 6"'$  (1.676 m)     Head Circumference --      Peak Flow --      Pain Score 03/24/22 1924 7     Pain Loc --      Pain Edu? --      Excl. in [place]? --     Most recent vital signs: Vitals:   03/24/22 1938 03/24/22 2000  BP:  130/64  Pulse: 82 79  Resp: (!) 23 (!) 22  Temp:    SpO2: 94% 93%    General: Awake, alert, oriented. CV:  Good peripheral perfusion. Regular rate and rhythm. Resp:  Normal effort.  Abd:  No distention.  Other:  Erythema and swelling to bilateral lower legs.    ED Results / Procedures / Treatments   Labs (all labs ordered are listed, but only abnormal results are displayed) Labs Reviewed  BASIC METABOLIC PANEL - Abnormal; Notable for the following components:      Result Value   Potassium 3.4 (*)    Glucose, Bld 125 (*)    All other components within normal limits  CULTURE, BLOOD (ROUTINE X 2)  CULTURE, BLOOD (ROUTINE X 2)  CBC  URINALYSIS, ROUTINE W REFLEX MICROSCOPIC  LACTIC ACID, PLASMA  LACTIC  ACID, PLASMA     EKG  I, Nance Pear, attending physician, personally viewed and interpreted this EKG  EKG Time: 1929 Rate: 84 Rhythm: sinus rhythm Axis: normal Intervals: qtc 390 QRS: narrow ST changes: no st elevation Impression: normal ekg  RADIOLOGY I independently interpreted and visualized the Right hand. My interpretation: No acute osseous abnormality Radiology interpretation:  IMPRESSION:  No fractures or foreign bodies. The fifth finger is flexed at the  distal interphalangeal joint. This is an age-indeterminate finding.  Recommend clinical correlation.     PROCEDURES:  Critical Care performed: Yes, see critical care procedure note(s)  Procedures  CRITICAL CARE Performed by: Nance Pear   Total critical care time: 35 minutes  Critical care time was exclusive of separately billable procedures and treating other patients.  Critical care was necessary to treat or prevent imminent or life-threatening deterioration.  Critical care was time spent personally by me on the following activities: development of treatment plan with patient and/or surrogate as well as nursing, discussions  with consultants, evaluation of patient's response to treatment, examination of patient, obtaining history from patient or surrogate, ordering and performing treatments and interventions, ordering and review of laboratory studies, ordering and review of radiographic studies, pulse oximetry and re-evaluation of patient's condition.   MEDICATIONS ORDERED IN ED: Medications - No data to display   IMPRESSION / MDM / [place] / ED COURSE  I reviewed the triage vital signs and the nursing notes.                              Differential diagnosis includes, but is not limited to, anemia, electrolyte abnormality, arrhythmia, infection.  Patient's presentation is most consistent with acute presentation with potential threat to life or bodily function.  Patient  presented to the emergency department today after being found by a passerby apparently after passing out.  Initial vital signs here the patient was very febrile.  Did have some erythema to his lower legs.  Patient did have episode of desaturation on room air so was placed on nasal cannula.  Given fever did have concern for infection and work-up was concerning for possible pneumonia.  He was started on broad-spectrum antibiotics.  Discussed with Dr. Trilby Drummer with the hospitalist service who will plan on admission.   FINAL CLINICAL IMPRESSION(S) / ED DIAGNOSES   Final diagnoses:  Pneumonia due to infectious organism, unspecified laterality, unspecified part of lung      Note:  This document was prepared using Dragon voice recognition software and may include unintentional dictation errors.    Nance Pear, MD 03/24/22 [phone]

## 2022-03-25 ENCOUNTER — Observation Stay
Admit: 2022-03-25 | Discharge: 2022-03-25 | Disposition: A | Discharge status: Home / Self Care | Payer: Medicare Other | Attending: Internal Medicine | Admitting: Internal Medicine

## 2022-03-25 ENCOUNTER — Encounter: Payer: Self-pay | Admitting: Internal Medicine

## 2022-03-25 ENCOUNTER — Observation Stay: Payer: Medicare Other

## 2022-03-25 DIAGNOSIS — B961 Klebsiella pneumoniae [K. pneumoniae] as the cause of diseases classified elsewhere: Secondary | ICD-10-CM | POA: Diagnosis present

## 2022-03-25 DIAGNOSIS — J189 Pneumonia, unspecified organism: Secondary | ICD-10-CM | POA: Diagnosis present

## 2022-03-25 DIAGNOSIS — R6520 Severe sepsis without septic shock: Secondary | ICD-10-CM | POA: Diagnosis present

## 2022-03-25 DIAGNOSIS — E11649 Type 2 diabetes mellitus with hypoglycemia without coma: Secondary | ICD-10-CM | POA: Diagnosis not present

## 2022-03-25 DIAGNOSIS — E162 Hypoglycemia, unspecified: Secondary | ICD-10-CM | POA: Diagnosis not present

## 2022-03-25 DIAGNOSIS — D696 Thrombocytopenia, unspecified: Secondary | ICD-10-CM | POA: Diagnosis present

## 2022-03-25 DIAGNOSIS — B1920 Unspecified viral hepatitis C without hepatic coma: Secondary | ICD-10-CM | POA: Diagnosis present

## 2022-03-25 DIAGNOSIS — Z8616 Personal history of COVID-19: Secondary | ICD-10-CM | POA: Diagnosis not present

## 2022-03-25 DIAGNOSIS — Z5900 Homelessness unspecified: Secondary | ICD-10-CM | POA: Diagnosis not present

## 2022-03-25 DIAGNOSIS — Z6841 Body Mass Index (BMI) 40.0 and over, adult: Secondary | ICD-10-CM | POA: Diagnosis not present

## 2022-03-25 DIAGNOSIS — F4323 Adjustment disorder with mixed anxiety and depressed mood: Secondary | ICD-10-CM | POA: Diagnosis present

## 2022-03-25 DIAGNOSIS — E1165 Type 2 diabetes mellitus with hyperglycemia: Secondary | ICD-10-CM | POA: Diagnosis present

## 2022-03-25 DIAGNOSIS — I110 Hypertensive heart disease with heart failure: Secondary | ICD-10-CM | POA: Diagnosis present

## 2022-03-25 DIAGNOSIS — E7800 Pure hypercholesterolemia, unspecified: Secondary | ICD-10-CM | POA: Diagnosis present

## 2022-03-25 DIAGNOSIS — Z20822 Contact with and (suspected) exposure to covid-19: Secondary | ICD-10-CM | POA: Diagnosis present

## 2022-03-25 DIAGNOSIS — E876 Hypokalemia: Secondary | ICD-10-CM | POA: Diagnosis present

## 2022-03-25 DIAGNOSIS — R55 Syncope and collapse: Secondary | ICD-10-CM | POA: Diagnosis present

## 2022-03-25 DIAGNOSIS — I890 Lymphedema, not elsewhere classified: Secondary | ICD-10-CM | POA: Diagnosis present

## 2022-03-25 DIAGNOSIS — A419 Sepsis, unspecified organism: Secondary | ICD-10-CM | POA: Diagnosis not present

## 2022-03-25 DIAGNOSIS — D649 Anemia, unspecified: Secondary | ICD-10-CM | POA: Diagnosis present

## 2022-03-25 DIAGNOSIS — A4159 Other Gram-negative sepsis: Secondary | ICD-10-CM | POA: Diagnosis present

## 2022-03-25 DIAGNOSIS — R7881 Bacteremia: Secondary | ICD-10-CM | POA: Diagnosis not present

## 2022-03-25 DIAGNOSIS — J440 Chronic obstructive pulmonary disease with acute lower respiratory infection: Secondary | ICD-10-CM | POA: Diagnosis present

## 2022-03-25 DIAGNOSIS — I5032 Chronic diastolic (congestive) heart failure: Secondary | ICD-10-CM | POA: Diagnosis present

## 2022-03-25 DIAGNOSIS — Z87891 Personal history of nicotine dependence: Secondary | ICD-10-CM | POA: Diagnosis not present

## 2022-03-25 DIAGNOSIS — F1110 Opioid abuse, uncomplicated: Secondary | ICD-10-CM | POA: Diagnosis present

## 2022-03-25 LAB — BLOOD CULTURE ID PANEL (REFLEXED) - BCID2

## 2022-03-25 LAB — COMPREHENSIVE METABOLIC PANEL
ALT: 42 U/L (ref 0–44)
AST: 62 U/L — ABNORMAL HIGH (ref 15–41)
Albumin: 2.4 g/dL — ABNORMAL LOW (ref 3.5–5.0)
Alkaline Phosphatase: 54 U/L (ref 38–126)
Anion gap: 7 (ref 5–15)
BUN: 12 mg/dL (ref 8–23)
CO2: 22 mmol/L (ref 22–32)
Calcium: 9 mg/dL (ref 8.9–10.3)
Chloride: 108 mmol/L (ref 98–111)
Creatinine, Ser: 1.15 mg/dL (ref 0.61–1.24)
GFR, Estimated: >60 mL/min (ref >60)
Glucose, Bld: 109 mg/dL — ABNORMAL HIGH (ref 70–99)
Potassium: 4 mmol/L (ref 3.5–5.1)
Sodium: 137 mmol/L (ref 135–145)
Total Bilirubin: 2.5 mg/dL — ABNORMAL HIGH (ref 0.3–1.2)
Total Protein: 5.7 g/dL — ABNORMAL LOW (ref 6.5–8.1)

## 2022-03-25 LAB — ECHOCARDIOGRAM COMPLETE
AR max vel: 2.13 cm2
AV Peak grad: 13.5 mmHg
Ao pk vel: 1.84 m/s
Area-P 1/2: 3.03 cm2
Calc EF: 64.9 %
Height: 66 in
Single Plane A2C EF: 68.5 %
Single Plane A4C EF: 60.2 %
Weight: 4500.91 oz

## 2022-03-25 LAB — BRAIN NATRIURETIC PEPTIDE: B Natriuretic Peptide: 460.1 pg/mL — ABNORMAL HIGH (ref 0.0–100.0)

## 2022-03-25 LAB — CBC
HCT: 30.5 % — ABNORMAL LOW (ref 39.0–52.0)
Hemoglobin: 10.1 g/dL — ABNORMAL LOW (ref 13.0–17.0)
MCH: 30.3 pg (ref 26.0–34.0)
MCHC: 33.1 g/dL (ref 30.0–36.0)
MCV: 91.6 fL (ref 80.0–100.0)
Platelets: 89 10*3/uL — ABNORMAL LOW (ref 150–400)
RBC: 3.33 MIL/uL — ABNORMAL LOW (ref 4.22–5.81)
RDW: 14.9 % (ref 11.5–15.5)
WBC: 7.1 10*3/uL (ref 4.0–10.5)
nRBC: 0 % (ref 0.0–0.2)

## 2022-03-25 LAB — RESPIRATORY PANEL BY PCR

## 2022-03-25 LAB — PROCALCITONIN: Procalcitonin: 1.52 ng/mL

## 2022-03-25 LAB — SARS CORONAVIRUS 2 BY RT PCR: SARS Coronavirus 2 by RT PCR: NEGATIVE

## 2022-03-25 MED ORDER — SODIUM CHLORIDE 0.9 % IV SOLN
2.0000 g | Freq: Three times a day (TID) | INTRAVENOUS | Status: DC
  Administered 2022-03-25 – 2022-03-26 (×3): 2 g via INTRAVENOUS
  Filled 2022-03-25: qty 2
  Filled 2022-03-25 (×3): qty 12.5

## 2022-03-25 MED ORDER — LACTATED RINGERS IV SOLN: INTRAVENOUS | Status: DC

## 2022-03-25 MED ORDER — METHOCARBAMOL 500 MG PO TABS
500.0000 mg | ORAL_TABLET | Freq: Three times a day (TID) | ORAL | Status: DC
  Administered 2022-03-25 (×3): 500 mg via ORAL
  Filled 2022-03-25 (×3): qty 1

## 2022-03-25 MED ORDER — IOHEXOL 300 MG/ML  SOLN
100.0000 mL | Freq: Once | INTRAMUSCULAR | Status: AC | PRN
  Administered 2022-03-25: 100 mL via INTRAVENOUS

## 2022-03-25 MED ORDER — ALUM & MAG HYDROXIDE-SIMETH 200-200-20 MG/5ML PO SUSP
30.0000 mL | Freq: Four times a day (QID) | ORAL | Status: DC | PRN
Start: 2022-03-25 — End: 2022-03-29
  Administered 2022-03-25 (×2): 30 mL via ORAL
  Filled 2022-03-25 (×2): qty 30

## 2022-03-25 MED ORDER — BUPRENORPHINE HCL-NALOXONE HCL 2-0.5 MG SL SUBL
1.0000 | SUBLINGUAL_TABLET | Freq: Every day | SUBLINGUAL | Status: DC
  Administered 2022-03-25 – 2022-03-29 (×5): 1 via SUBLINGUAL
  Filled 2022-03-25 (×5): qty 1

## 2022-03-25 MED ORDER — PERFLUTREN LIPID MICROSPHERE
1.0000 mL | INTRAVENOUS | Status: AC | PRN
  Administered 2022-03-25: 3 mL via INTRAVENOUS

## 2022-03-25 NOTE — Plan of Care (Signed)
?  Problem: Elimination: ?Goal: Will not experience complications related to bowel motility ?Outcome: Progressing ?  ?Problem: Pain Managment: ?Goal: General experience of comfort will improve ?Outcome: Progressing ?  ?Problem: Safety: ?Goal: Ability to remain free from injury will improve ?Outcome: Progressing ?  ?

## 2022-03-25 NOTE — Consult Note (Signed)
Pharmacy Antibiotic Note  [name] is a 71 y.o. male admitted on 03/24/2022 with bacteremia.  Blood cultures positive for Klebsiella pneumoniae and enterobacterales. Pharmacy has been consulted for Cefepime dosing.  Plan: Cefepime 2g IV Q 8 hours  Height: '5\' 6"'$  (167.6 cm) Weight: 127.6 kg (281 lb 4.9 oz) IBW/kg (Calculated) : 63.8  Temp (24hrs), Avg:100.4 F (38 C), Min:97.8 F (36.6 C), Max:103.1 F (39.5 C)  Recent Labs  Lab 03/21/22 1218 03/24/22 1927 03/24/22 1929 03/24/22 2225 03/25/22 0521  WBC 3.0* 4.9  --   --  7.1  CREATININE 1.15 1.22  --   --  1.15  LATICACIDVEN  --   --  2.5* 1.8  --     Estimated Creatinine Clearance: 73.3 mL/min (by C-G formula based on SCr of 1.15 mg/dL).    Allergies  Allergen Reactions   Novocain [Procaine]    Sulfa Antibiotics Other (See Comments)    Antimicrobials this admission: Cefepime 7/29 >>  Vanc/Flagyl 7/29 x1  Dose adjustments this admission: na  Microbiology results: 7/29 BCx: GNR(enterobacterales, klebsiella pneumoniae) 7/30 Resp cx: negative    Thank you for allowing pharmacy to be a part of this patient's care.  [name] A [name] 03/25/2022 1:34 PM

## 2022-03-25 NOTE — Progress Notes (Signed)
PROGRESS NOTE    [name]  IFO:[id number] DOB: 1951/05/28 DOA: 03/24/2022 PCP: Denton Lank, MD    Brief Narrative:   71 y.o. male with medical history significant of hypertension, hyperlipidemia, thrombocytopenia, transaminitis, hepatitis C, anxiety, depression, diabetes, anemia, lymphedema, obesity, COPD presenting with episode of loss of consciousness/syncope and fever.   Patient reportedly found down by a bystander.  He has been found standing in the sun he reports he passed out earlier today.  He stated felt well this morning and went for a walk later in the day and tripped over a curb as best he could remember.  Of note, he was in the ED for syncope 3 days ago, reporting several episodes without prodrome.   He also reports some mild increase shortness of breath last few days and possibly increase in his chronic edema.  Blood cultures positive for gram-negative rods.  On appropriate antibiotics empirically   Assessment & Plan:   Principal Problem:   Syncope and collapse Active Problems:   Adjustment disorder with mixed anxiety and depressed mood   Anemia   HTN (hypertension)   Uncontrolled type 2 diabetes mellitus with hyperglycemia, without long-term current use of insulin (HCC)   Obesity, Class III, BMI 40-49.9 (morbid obesity) (HCC)   COPD (chronic obstructive pulmonary disease) (HCC)   Pure hypercholesterolemia   Hypokalemia   Fever  Syncope Patient presented after being found standing in the sun.  Reportedly passed out today.  Was in the ER 4 days ago for similar event.  Likely multifactorial in origin. Plan: Telemetry Echocardiogram Orthostatic vitals Monitor and optimize electrolytes  Fever Gram-negative bacteremia Sepsis secondary to above Presents with fever up to 103.1.  Blood culture positive for Klebsiella.  No leukocytosis.  Possible pneumonia versus edema on chest x-ray.  Patient reports "kidney infection".  CT abdomen pelvis negative for acute  pyelonephritis.  Urinalysis equivocal for infection.  Respiratory viral panel negative.  COVID pending.  Chronic lymphedema lower extremities difficult to ascertain whether patient truly has a cellulitis.  Procalcitonin elevated however. Plan: Continue cefepime, pharmacy dosing assistance appreciated Check COVID Monitor vitals and fever curve Follow cultures for pathogen identification  Possible CHF Patient does report a history of "congestive heart failure" however echo in 2022 with normal EF and no diastolic dysfunction He is chronically on diuretic however BNP is elevated Plan: Follow-up TTE Defer diuretics for now in setting of sepsis Patient will likely need to diuretics as sepsis physiology improved  Hypokalemia Mild, 3.4 Monitor and replace as necessary  Essential hypertension PTA lisinopril Home Lasix on hold  Depression Anxiety PTA Paxil  Diabetes SSI Carb modified diet  COPD No evidence of exacerbation PTA albuterol as needed  Anemia Unclear etiology, suspect anemia of chronic disease No indication for transfusion Trend CBC  Hyperlipidemia Not currently on any statin  Morbid obesity BMI 45  History hepatitis C No active issues  Opioid use disorder Confirms that he does take Suboxone We will restart   DVT prophylaxis: SQ Lovenox Code Status: Full Family Communication: None Disposition Plan: Status is: Observation The patient will require care spanning > 2 midnights and should be moved to inpatient because: Gram-negative bacteremia, sepsis   Level of care: Telemetry Medical  Consultants:  None  Procedures:  None  Antimicrobials: Cefepime   Subjective: Seen and examined.  Resting comfortably in bed.  No visible distress.  Does endorse diffuse arthritic pain  Objective: Vitals:   03/24/22 2259 03/25/22 0452 03/25/22 0500 03/25/22 0750  BP: 130/63  134/66  110/62  Pulse: 77 81  (!) 58  Resp: '18 18  20  '$ Temp: 100.3 F (37.9 C)  100.2 F (37.9 C)  97.8 F (36.6 C)  TempSrc:      SpO2: 99%   100%  Weight:   127.6 kg   Height:        Intake/Output Summary (Last 24 hours) at 03/25/2022 1132 Last data filed at 03/25/2022 0845 Gross per 24 hour  Intake 1395.39 ml  Output 100 ml  Net 1295.39 ml   Filed Weights   03/24/22 1925 03/25/22 0500  Weight: 124.7 kg 127.6 kg    Examination:  General exam: NAD Respiratory system: Scattered crackles bilaterally.  Normal work of breathing.  Room air Cardiovascular system: S1-S2, RRR, no murmurs, 2+ pitting edema BLE Gastrointestinal system: Obese, soft, NT/ND, normal bowel sounds Central nervous system: Alert and oriented. No focal neurological deficits. Extremities: Symmetric 5 x 5 power. Skin: No rashes, lesions or ulcers Psychiatry: Judgement and insight appear normal. Mood & affect appropriate.     Data Reviewed: I have personally reviewed following labs and imaging studies  CBC: Recent Labs  Lab 03/21/22 1218 03/24/22 1927 03/25/22 0521  WBC 3.0* 4.9 7.1  HGB 10.7* 10.9* 10.1*  HCT 33.2* 33.4* 30.5*  MCV 94.1 92.3 91.6  PLT 87* 117* 89*   Basic Metabolic Panel: Recent Labs  Lab 03/21/22 1218 03/24/22 1927 03/24/22 2030 03/25/22 0521  NA 136 136  --  137  K 3.7 3.4*  --  4.0  CL 104 105  --  108  CO2 25 24  --  22  GLUCOSE 130* 125*  --  109*  BUN 8 11  --  12  CREATININE 1.15 1.22  --  1.15  CALCIUM 9.3 9.2  --  9.0  MG  --   --  1.6*  --    GFR: Estimated Creatinine Clearance: 73.3 mL/min (by C-G formula based on SCr of 1.15 mg/dL). Liver Function Tests: Recent Labs  Lab 03/21/22 1218 03/25/22 0521  AST 71* 62*  ALT 47* 42  ALKPHOS 62 54  BILITOT 2.4* 2.5*  PROT 6.3* 5.7*  ALBUMIN 2.7* 2.4*   No results for input(s): "LIPASE", "AMYLASE" in the last 168 hours. No results for input(s): "AMMONIA" in the last 168 hours. Coagulation Profile: No results for input(s): "INR", "PROTIME" in the last 168 hours. Cardiac  Enzymes: Recent Labs  Lab 03/24/22 1957  CKTOTAL 614*   BNP (last 3 results) No results for input(s): "PROBNP" in the last 8760 hours. HbA1C: No results for input(s): "HGBA1C" in the last 72 hours. CBG: No results for input(s): "GLUCAP" in the last 168 hours. Lipid Profile: No results for input(s): "CHOL", "HDL", "LDLCALC", "TRIG", "CHOLHDL", "LDLDIRECT" in the last 72 hours. Thyroid Function Tests: No results for input(s): "TSH", "T4TOTAL", "FREET4", "T3FREE", "THYROIDAB" in the last 72 hours. Anemia Panel: No results for input(s): "VITAMINB12", "FOLATE", "FERRITIN", "TIBC", "IRON", "RETICCTPCT" in the last 72 hours. Sepsis Labs: Recent Labs  Lab 03/24/22 1929 03/24/22 2030 03/24/22 2225 03/25/22 0521  PROCALCITON  --  0.11  --  1.52  LATICACIDVEN 2.5*  --  1.8  --     Recent Results (from the past 240 hour(s))  Blood culture (routine x 2)     Status: None (Preliminary result)   Collection Time: 03/24/22  7:34 PM   Specimen: BLOOD  Result Value Ref Range Status   Specimen Description BLOOD RIGHT ANTECUBITAL  Final   Special Requests  Final    BOTTLES DRAWN AEROBIC AND ANAEROBIC Blood Culture results may not be optimal due to an excessive volume of blood received in culture bottles   Culture  Setup Time   Final    Organism ID to follow GRAM NEGATIVE RODS IN BOTH AEROBIC AND ANAEROBIC BOTTLES CRITICAL RESULT CALLED TO, READ BACK BY AND VERIFIED WITH: WILL ANDERSON '@0835'$  03/25/22 MJU Performed at [facility], [address] [place], [place] [address]    Culture GRAM NEGATIVE RODS  Final   Report Status PENDING  Incomplete  Blood Culture ID Panel (Reflexed)     Status: Abnormal   Collection Time: 03/24/22  7:34 PM  Result Value Ref Range Status   Enterococcus faecalis NOT DETECTED NOT DETECTED Final   Enterococcus Faecium NOT DETECTED NOT DETECTED Final   Listeria monocytogenes NOT DETECTED NOT DETECTED Final   Staphylococcus species NOT DETECTED NOT  DETECTED Final   Staphylococcus aureus (BCID) NOT DETECTED NOT DETECTED Final   Staphylococcus epidermidis NOT DETECTED NOT DETECTED Final   Staphylococcus lugdunensis NOT DETECTED NOT DETECTED Final   Streptococcus species NOT DETECTED NOT DETECTED Final   Streptococcus agalactiae NOT DETECTED NOT DETECTED Final   Streptococcus pneumoniae NOT DETECTED NOT DETECTED Final   Streptococcus pyogenes NOT DETECTED NOT DETECTED Final   A.calcoaceticus-baumannii NOT DETECTED NOT DETECTED Final   Bacteroides fragilis NOT DETECTED NOT DETECTED Final   Enterobacterales DETECTED (A) NOT DETECTED Final    Comment: Enterobacterales represent a large order of gram negative bacteria, not a single organism. CRITICAL RESULT CALLED TO, READ BACK BY AND VERIFIED WITH: WILL ANDERSON '@0835'$  03/25/22 MJU    Enterobacter cloacae complex NOT DETECTED NOT DETECTED Final   Escherichia coli NOT DETECTED NOT DETECTED Final   Klebsiella aerogenes NOT DETECTED NOT DETECTED Final   Klebsiella oxytoca NOT DETECTED NOT DETECTED Final   Klebsiella pneumoniae DETECTED (A) NOT DETECTED Final    Comment: CRITICAL RESULT CALLED TO, READ BACK BY AND VERIFIED WITH: WILL ANDERSON '@0835'$  03/25/22 MJU    Proteus species NOT DETECTED NOT DETECTED Final   Salmonella species NOT DETECTED NOT DETECTED Final   Serratia marcescens NOT DETECTED NOT DETECTED Final   Haemophilus influenzae NOT DETECTED NOT DETECTED Final   Neisseria meningitidis NOT DETECTED NOT DETECTED Final   Pseudomonas aeruginosa NOT DETECTED NOT DETECTED Final   Stenotrophomonas maltophilia NOT DETECTED NOT DETECTED Final   Candida albicans NOT DETECTED NOT DETECTED Final   Candida auris NOT DETECTED NOT DETECTED Final   Candida glabrata NOT DETECTED NOT DETECTED Final   Candida krusei NOT DETECTED NOT DETECTED Final   Candida parapsilosis NOT DETECTED NOT DETECTED Final   Candida tropicalis NOT DETECTED NOT DETECTED Final   Cryptococcus neoformans/gattii NOT  DETECTED NOT DETECTED Final   CTX-M ESBL NOT DETECTED NOT DETECTED Final   Carbapenem resistance IMP NOT DETECTED NOT DETECTED Final   Carbapenem resistance KPC NOT DETECTED NOT DETECTED Final   Carbapenem resistance NDM NOT DETECTED NOT DETECTED Final   Carbapenem resist OXA 48 LIKE NOT DETECTED NOT DETECTED Final   Carbapenem resistance VIM NOT DETECTED NOT DETECTED Final    Comment: Performed at [facility], [address] [place], [place] [address]  Blood culture (routine x 2)     Status: None (Preliminary result)   Collection Time: 03/24/22  7:39 PM   Specimen: BLOOD  Result Value Ref Range Status   Specimen Description BLOOD LEFT ANTECUBITAL  Final   Special Requests   Final  BOTTLES DRAWN AEROBIC AND ANAEROBIC Blood Culture adequate volume   Culture  Setup Time   Final    AEROBIC BOTTLE ONLY GRAM NEGATIVE RODS CRITICAL RESULT CALLED TO, READ BACK BY AND VERIFIED WITH: WILL ANDERSON '@0835'$  03/25/22 MJU Performed at [facility], [address] [place], [place] [address]    Culture GRAM NEGATIVE RODS  Final   Report Status PENDING  Incomplete  Respiratory (~20 pathogens) panel by PCR     Status: None   Collection Time: 03/24/22 10:18 PM   Specimen: Nasopharyngeal Swab; Respiratory  Result Value Ref Range Status   Adenovirus NOT DETECTED NOT DETECTED Final   Coronavirus 229E NOT DETECTED NOT DETECTED Final    Comment: (NOTE) The Coronavirus on the Respiratory Panel, DOES NOT test for the novel  Coronavirus (2019 nCoV)    Coronavirus HKU1 NOT DETECTED NOT DETECTED Final   Coronavirus NL63 NOT DETECTED NOT DETECTED Final   Coronavirus OC43 NOT DETECTED NOT DETECTED Final   Metapneumovirus NOT DETECTED NOT DETECTED Final   Rhinovirus / Enterovirus NOT DETECTED NOT DETECTED Final   Influenza A NOT DETECTED NOT DETECTED Final   Influenza B NOT DETECTED NOT DETECTED Final   Parainfluenza Virus 1 NOT DETECTED NOT DETECTED Final   Parainfluenza Virus 2 NOT  DETECTED NOT DETECTED Final   Parainfluenza Virus 3 NOT DETECTED NOT DETECTED Final   Parainfluenza Virus 4 NOT DETECTED NOT DETECTED Final   Respiratory Syncytial Virus NOT DETECTED NOT DETECTED Final   Bordetella pertussis NOT DETECTED NOT DETECTED Final   Bordetella Parapertussis NOT DETECTED NOT DETECTED Final   Chlamydophila pneumoniae NOT DETECTED NOT DETECTED Final   Mycoplasma pneumoniae NOT DETECTED NOT DETECTED Final    Comment: Performed at [facility], [place]. [address], [place],  [address]         Radiology Studies: CT ABDOMEN PELVIS W CONTRAST  Result Date: 03/25/2022 CLINICAL DATA:  Fever. Question sepsis. EXAM: CT ABDOMEN AND PELVIS WITH CONTRAST TECHNIQUE: Multidetector CT imaging of the abdomen and pelvis was performed using the standard protocol following bolus administration of intravenous contrast. RADIATION DOSE REDUCTION: This exam was performed according to the departmental dose-optimization program which includes automated exposure control, adjustment of the mA and/or kV according to patient size and/or use of iterative reconstruction technique. CONTRAST:  176m OMNIPAQUE IOHEXOL 300 MG/ML  SOLN COMPARISON:  CT of the abdomen and pelvis 02/17/2020 FINDINGS: Lower chest: Mild dependent atelectasis is present. A 4 mm pleural based nodule is noted in the left lower lobe on image 26 of series 4. Lungs are otherwise clear. Heart size is normal. Hepatobiliary: A somewhat nodular contour of the liver is again noted. No discrete lesions are present. The common bile duct is progressively enlarged, now measuring 27 mm. No obstructing lesion is present. Cholecystectomy noted. Pancreas: Unremarkable. No pancreatic ductal dilatation or surrounding inflammatory changes. Spleen: Normal in size without focal abnormality. Splenules are stable. Adrenals/Urinary Tract: The adrenal glands are within normal limits bilaterally. Three nonobstructing stones are present at the lower pole  of the right kidney. The largest measures 7 mm. Additional punctate nonobstructing stone is present in the midportion of the right kidney. No significant left-sided nephrolithiasis. No obstruction is present. Ureters are within normal limits. Urinary bladder is normal. Stomach/Bowel: The stomach and duodenum are within normal limits. Small bowel is unremarkable. Terminal ileum is normal. The appendix is visualized and within normal limits. The ascending and transverse colon are normal. The descending and sigmoid colon are normal.  Vascular/Lymphatic: R subcentimeter para-aortic nodes are present. A single enlarged node at the level of the IMA takeoff has slightly increased in size, now measuring 12.5 mm. It was 11 mm previously. No other significant adenopathy is present. Reproductive: Prostate is unremarkable. Other: No abdominal wall hernia or abnormality. No abdominopelvic ascites. Musculoskeletal: No acute or significant osseous findings. Subcutaneous edema is present bilaterally. No discrete fluid collections are present. IMPRESSION: 1. No acute or focal lesion to explain the patient's fever. 2. Progressive dilation of the common bile duct, now measuring 27 mm. No obstructing lesion is present. This could be related to prior cholecystectomy. Recommend correlation with liver function tests. 3. Nonobstructing stones at the lower pole of the right kidney. 4. Slight increase in size of a single enlarged node at the level of the IMA takeoff, now measuring 12.5 mm. It was 11 mm previously. No other significant adenopathy is present. 5. 4 mm pleural based nodule in the left lower lobe. Electronically Signed   By: San Morelle M.D.   On: 03/25/2022 11:12   DG Chest Portable 1 View  Result Date: 03/24/2022 CLINICAL DATA:  Fever. EXAM: PORTABLE CHEST 1 VIEW COMPARISON:  Chest radiograph 03/21/2022; CT angio chest 03/18/2020. FINDINGS: Stable cardiomediastinal silhouette. Mild tortuosity of the thoracic aorta  with aortic calcifications. New diffuse interstitial opacities. No focal consolidation, large volume pleural effusion, or pneumothorax. Visualized osseous structures are unremarkable. IMPRESSION: New diffuse interstitial opacities may reflect mild pulmonary edema or possibly atypical infection in the appropriate clinical setting. Electronically Signed   By: Ileana Roup M.D.   On: 03/24/2022 21:16   DG Hand Complete Right  Result Date: 03/24/2022 CLINICAL DATA:  Syncope.  Abrasion to left pinky finger. EXAM: RIGHT HAND - COMPLETE 3+ VIEW COMPARISON:  None Available. FINDINGS: No fractures or dislocations. The fifth finger is flexed at the distal interphalangeal joint. No fractures. IMPRESSION: No fractures or foreign bodies. The fifth finger is flexed at the distal interphalangeal joint. This is an age-indeterminate finding. Recommend clinical correlation. Electronically Signed   By: Dorise Bullion III M.D.   On: 03/24/2022 20:02        Scheduled Meds:  buprenorphine-naloxone  1 tablet Sublingual Daily   enoxaparin (LOVENOX) injection  40 mg Subcutaneous Q24H   lisinopril  30 mg Oral Daily   methocarbamol  500 mg Oral TID   pantoprazole  40 mg Oral Daily   PARoxetine  10 mg Oral Daily   sodium chloride flush  3 mL Intravenous Q12H   Continuous Infusions:  ceFEPime (MAXIPIME) IV       LOS: 0 days    Sidney Ace, MD [facility]   If 7PM-7AM, please contact night-coverage  03/25/2022, 11:32 AM

## 2022-03-25 NOTE — Progress Notes (Signed)
PHARMACY - PHYSICIAN COMMUNICATION CRITICAL VALUE ALERT - BLOOD CULTURE IDENTIFICATION (BCID)  [name] is an 71 y.o. male who presented to [facility] on 03/24/2022 with a chief complaint of syncope, fever, loss of consciousness  Assessment:  3 of 4 (2 aerobic, 1 anaerobic) showing GNRs - Kleb pneumo and Enterobacterales  Name of physician (or Provider) Contacted: Ralene Muskrat  Current antibiotics: Cefepime  Changes to prescribed antibiotics recommended:  Patient is on recommended antibiotics - No changes needed  Results for orders placed or performed during the hospital encounter of 03/24/22  Blood Culture ID Panel (Reflexed) (Collected: 03/24/2022  7:34 PM)  Result Value Ref Range   Enterococcus faecalis NOT DETECTED NOT DETECTED   Enterococcus Faecium NOT DETECTED NOT DETECTED   Listeria monocytogenes NOT DETECTED NOT DETECTED   Staphylococcus species NOT DETECTED NOT DETECTED   Staphylococcus aureus (BCID) NOT DETECTED NOT DETECTED   Staphylococcus epidermidis NOT DETECTED NOT DETECTED   Staphylococcus lugdunensis NOT DETECTED NOT DETECTED   Streptococcus species NOT DETECTED NOT DETECTED   Streptococcus agalactiae NOT DETECTED NOT DETECTED   Streptococcus pneumoniae NOT DETECTED NOT DETECTED   Streptococcus pyogenes NOT DETECTED NOT DETECTED   A.calcoaceticus-baumannii NOT DETECTED NOT DETECTED   Bacteroides fragilis NOT DETECTED NOT DETECTED   Enterobacterales DETECTED (A) NOT DETECTED   Enterobacter cloacae complex NOT DETECTED NOT DETECTED   Escherichia coli NOT DETECTED NOT DETECTED   Klebsiella aerogenes NOT DETECTED NOT DETECTED   Klebsiella oxytoca NOT DETECTED NOT DETECTED   Klebsiella pneumoniae DETECTED (A) NOT DETECTED   Proteus species NOT DETECTED NOT DETECTED   Salmonella species NOT DETECTED NOT DETECTED   Serratia marcescens NOT DETECTED NOT DETECTED   Haemophilus influenzae NOT DETECTED NOT DETECTED   Neisseria meningitidis NOT DETECTED NOT  DETECTED   Pseudomonas aeruginosa NOT DETECTED NOT DETECTED   Stenotrophomonas maltophilia NOT DETECTED NOT DETECTED   Candida albicans NOT DETECTED NOT DETECTED   Candida auris NOT DETECTED NOT DETECTED   Candida glabrata NOT DETECTED NOT DETECTED   Candida krusei NOT DETECTED NOT DETECTED   Candida parapsilosis NOT DETECTED NOT DETECTED   Candida tropicalis NOT DETECTED NOT DETECTED   Cryptococcus neoformans/gattii NOT DETECTED NOT DETECTED   CTX-M ESBL NOT DETECTED NOT DETECTED   Carbapenem resistance IMP NOT DETECTED NOT DETECTED   Carbapenem resistance KPC NOT DETECTED NOT DETECTED   Carbapenem resistance NDM NOT DETECTED NOT DETECTED   Carbapenem resist OXA 48 LIKE NOT DETECTED NOT DETECTED   Carbapenem resistance VIM NOT DETECTED NOT DETECTED    [name] 03/25/2022  9:51 AM

## 2022-03-26 ENCOUNTER — Inpatient Hospital Stay: Payer: Medicare Other

## 2022-03-26 DIAGNOSIS — R55 Syncope and collapse: Secondary | ICD-10-CM | POA: Diagnosis not present

## 2022-03-26 LAB — GLUCOSE, CAPILLARY
Glucose-Capillary: 65 mg/dL — ABNORMAL LOW (ref 70–99)
Glucose-Capillary: 81 mg/dL (ref 70–99)
Glucose-Capillary: 105 mg/dL — ABNORMAL HIGH (ref 70–99)
Glucose-Capillary: 92 mg/dL (ref 70–99)
Glucose-Capillary: 85 mg/dL (ref 70–99)

## 2022-03-26 LAB — HEMOGLOBIN A1C
Hgb A1c MFr Bld: 5.4 % (ref 4.8–5.6)
Mean Plasma Glucose: 108.28 mg/dL

## 2022-03-26 LAB — PROCALCITONIN: Procalcitonin: 1.06 ng/mL

## 2022-03-26 MED ORDER — DEXTROSE 50 % IV SOLN
12.5000 g | Freq: Once | INTRAVENOUS | Status: DC | PRN
Start: 2022-03-26 — End: 2022-03-29

## 2022-03-26 MED ORDER — METHOCARBAMOL 500 MG PO TABS
750.0000 mg | ORAL_TABLET | Freq: Three times a day (TID) | ORAL | Status: DC
  Administered 2022-03-26 – 2022-03-27 (×4): 750 mg via ORAL
  Filled 2022-03-26 (×4): qty 2

## 2022-03-26 MED ORDER — SODIUM CHLORIDE 0.9 % IV SOLN
2.0000 g | INTRAVENOUS | Status: DC
  Administered 2022-03-26 – 2022-03-29 (×4): 2 g via INTRAVENOUS
  Filled 2022-03-26 (×2): qty 2
  Filled 2022-03-26 (×2): qty 20

## 2022-03-26 MED ORDER — INSULIN ASPART 100 UNIT/ML IJ SOLN: 0.0000 [IU] | Freq: Every day | INTRAMUSCULAR | Status: DC

## 2022-03-26 MED ORDER — ENOXAPARIN SODIUM 80 MG/0.8ML IJ SOSY
0.5000 mg/kg | PREFILLED_SYRINGE | INTRAMUSCULAR | Status: DC
  Administered 2022-03-26 – 2022-03-28 (×3): 65 mg via SUBCUTANEOUS
  Filled 2022-03-26 (×4): qty 0.65

## 2022-03-26 MED ORDER — FUROSEMIDE 10 MG/ML IJ SOLN
40.0000 mg | Freq: Once | INTRAMUSCULAR | Status: AC
  Administered 2022-03-26: 40 mg via INTRAVENOUS
  Filled 2022-03-26: qty 4

## 2022-03-26 MED ORDER — INSULIN ASPART 100 UNIT/ML IJ SOLN
0.0000 [IU] | Freq: Three times a day (TID) | INTRAMUSCULAR | Status: DC
  Administered 2022-03-27 – 2022-03-28 (×2): 2 [IU] via SUBCUTANEOUS
  Filled 2022-03-26 (×2): qty 1

## 2022-03-26 NOTE — Plan of Care (Signed)

## 2022-03-26 NOTE — Progress Notes (Signed)
PHARMACIST - PHYSICIAN COMMUNICATION  CONCERNING:  Enoxaparin (Lovenox) for DVT Prophylaxis    RECOMMENDATION: Patient was prescribed enoxaprin '40mg'$  q24 hours for VTE prophylaxis.   Filed Weights   03/24/22 1925 03/25/22 0500 03/26/22 0500  Weight: 124.7 kg (275 lb) 127.6 kg (281 lb 4.9 oz) 128.9 kg (284 lb 2.8 oz)    Body mass index is 45.87 kg/m.  Estimated Creatinine Clearance: 73.7 mL/min (by C-G formula based on SCr of 1.15 mg/dL).   Based on [place] patient is candidate for enoxaparin 0.'5mg'$ /kg TBW SQ every 24 hours based on BMI being >30.  DESCRIPTION: Pharmacy has adjusted enoxaparin dose per [facility] policy.  Patient is now receiving enoxaparin 65 mg (0.5 mg/kg) every 24 hours   Glean Salvo, PharmD Clinical Pharmacist  03/26/2022 9:44 AM

## 2022-03-26 NOTE — TOC Progression Note (Signed)
Transition of Care [facility]) - Progression Note    Patient Details  Name: [name] MRN: [id number] Date of Birth: Feb 14, 1951  [facility] CM/SW [place], RN Phone Number: 03/26/2022, 3:57 PM  Clinical Narrative:     Met with the patient in the room, he tells me that he is not able to go back to where he was living due to there are three people living there and the landlord not allowing to return He wants to go to ALF and stay in [facility]       Expected Discharge Plan and Services                                                 Social Determinants of Health (SDOH) Interventions    Readmission Risk Interventions     No data to display

## 2022-03-26 NOTE — Evaluation (Signed)
Occupational Therapy Evaluation Patient Details Name: [name] MRN: [id number] DOB: 12/09/1950 Today's Date: 03/26/2022   History of Present Illness Pt is a 71 y/o M admitted on 03/24/22 after presenting with c/o episode of LOC/syncope & fever. Of note, pt was in the ED 3 days prior for syncope. Pt also found to have sepsis 2/2 klebsiella bacteremia. PMH: HTN, HLD, thrombocytopenia, transaminitis, Hep C, anxiety, depression, DM, anemia, lymphadema, obesity, COPD   Clinical Impression   Pt seen for brief OT evaluation, limited by neck and low back pain. Pt able to reposition himself in the bed with some increased pain but did not need direct assist. Pt endorses being up walking with PT earlier and generally at baseline able to walk and complete ADL tasks independently. However, he notes some fluid in his legs that makes it difficult to bend over/reach his feet for LB ADL at this time. Pt educated in AE to improve independence while minimizing back pain and discomfort. Pt verbalized understanding. Pt also educated in positioning in the bed for low back pain relief. Pt will benefit from skilled OT Services to maximize return to PLOF and minimize risk of falls and functional decline. Recommend HHOT services. Of note, pt endorses having nowhere currently to live and that [facility] is working on this.        Recommendations for follow up therapy are one component of a multi-disciplinary discharge planning process, led by the attending physician.  Recommendations may be updated based on patient status, additional functional criteria and insurance authorization.   Follow Up Recommendations  Home health OT    Assistance Recommended at Discharge Intermittent Supervision/Assistance  Patient can return home with the following A little help with walking and/or transfers;A little help with bathing/dressing/bathroom;Assist for transportation;Help with stairs or ramp for entrance    Functional Status  Assessment  Patient has had a recent decline in their functional status and demonstrates the ability to make significant improvements in function in a reasonable and predictable amount of time.  Equipment Recommendations  Other (comment) Management consultant)    Recommendations for Other Services       Precautions / Restrictions Precautions Precautions: Fall Restrictions Weight Bearing Restrictions: No      Mobility Bed Mobility               General bed mobility comments: pt able to reposition himself to improve comfort    Transfers                   General transfer comment: declined      Balance                                           ADL either performed or assessed with clinical judgement   ADL                                         General ADL Comments: Pt endorses he is typically able to dress himself but does have some swelling right now that makes bending over more difficult. Pt instructed in AE for LB ADL tasks.     Vision         Perception     Praxis      Pertinent Vitals/Pain Pain Assessment Pain Assessment: 0-10 Pain  Score: 6  Pain Location: neck and low pain in back since ~1 week ago Pain Descriptors / Indicators: Aching, Grimacing, Guarding Pain Intervention(s): Limited activity within patient's tolerance, Monitored during session, Repositioned     Hand Dominance     Extremity/Trunk Assessment Upper Extremity Assessment Upper Extremity Assessment: Overall WFL for tasks assessed   Lower Extremity Assessment Lower Extremity Assessment: Generalized weakness       Communication Communication Communication: No difficulties   Cognition Arousal/Alertness: Awake/alert Behavior During Therapy: WFL for tasks assessed/performed Overall Cognitive Status: Within Functional Limits for tasks assessed                                       General Comments       Exercises      Shoulder Instructions      Home Living Family/patient expects to be discharged to:: Shelter/Homeless                                 Additional Comments: Pt reports he recently lost his home ~1 week ago 2/2 landlord not renewing lease, so pt has been homeless. Pt reports he has no family/friends/neighbors that can assist at d/c but states his doctor (pt cannot recall his name)      Prior Functioning/Environment               Mobility Comments: Pt reports he was independent without AD until 2-3 months ago when he started using a cane 2/2 BLE edema & weakness. Pt also reports limiting back pain since ~1 week ago 2/2 injury in ambulance during last admission (but reports negative injury/imaging). Pt reports 6-8 falls in the past 2 weeks, noting he'll frequently trip over things/uneven ground & reports he "can't do stairs anymore".          OT Problem List: Decreased strength;Impaired balance (sitting and/or standing);Pain;Decreased knowledge of use of DME or AE      OT Treatment/Interventions: Self-care/ADL training;Therapeutic exercise;Therapeutic activities;DME and/or AE instruction;Patient/family education;Balance training    OT Goals(Current goals can be found in the care plan section) Acute Rehab OT Goals Patient Stated Goal: have less pain and be independent OT Goal Formulation: With patient Time For Goal Achievement: 04/09/22 Potential to Achieve Goals: Good ADL Goals Pt Will Perform Lower Body Dressing: with modified independence;with adaptive equipment;sit to/from stand Pt Will Transfer to Toilet: with modified independence;ambulating (LRAD) Pt Will Perform Toileting - Clothing Manipulation and hygiene: with modified independence Additional ADL Goal #1: Pt will verbalize plan to implement at least 1 learned compensatory strategy for ADL/IADL to minimize back pain and falls risk.  OT Frequency: Min 2X/week    Co-evaluation              AM-PAC OT "6  Clicks" Daily Activity     Outcome Measure Help from another person eating meals?: None Help from another person taking care of personal grooming?: None Help from another person toileting, which includes using toliet, bedpan, or urinal?: A Little Help from another person bathing (including washing, rinsing, drying)?: A Little Help from another person to put on and taking off regular upper body clothing?: None Help from another person to put on and taking off regular lower body clothing?: A Little 6 Click Score: 21   End of Session    Activity Tolerance: Patient limited by pain Patient  left: in bed;with call bell/phone within reach;with bed alarm set  OT Visit Diagnosis: Other abnormalities of gait and mobility (R26.89);Muscle weakness (generalized) (M62.81)                Time: 1650-1701 OT Time Calculation (min): 11 min Charges:  OT General Charges $OT Visit: 1 Visit OT Evaluation $OT Eval Low Complexity: 1 Low  Ardeth Perfect., MPH, MS, OTR/L ascom [phone] 03/26/22, 5:17 PM

## 2022-03-26 NOTE — Progress Notes (Signed)
\ PROGRESS NOTE    ORTON CAPELL  MEQ:[id number] DOB: 1950-12-19 DOA: 03/24/2022 PCP: Denton Lank, MD    Brief Narrative:   71 y.o. male with medical history significant of hypertension, hyperlipidemia, thrombocytopenia, transaminitis, hepatitis C, anxiety, depression, diabetes, anemia, lymphedema, obesity, COPD presenting with episode of loss of consciousness/syncope and fever.   Patient reportedly found down by a bystander.  He has been found standing in the sun he reports he passed out earlier today.  He stated felt well this morning and went for a walk later in the day and tripped over a curb as best he could remember.  Of note, he was in the ED for syncope 3 days ago, reporting several episodes without prodrome.   He also reports some mild increase shortness of breath last few days and possibly increase in his chronic edema.  Blood cultures positive for gram-negative rods.  Speciation with Klebsiella pneumonia.  Sensitivities pending as of 7/31.  Echocardiogram without evidence of systolic or diastolic dysfunction.  Assessment & Plan:   Principal Problem:   Syncope and collapse Active Problems:   Adjustment disorder with mixed anxiety and depressed mood   Anemia   HTN (hypertension)   Uncontrolled type 2 diabetes mellitus with hyperglycemia, without long-term current use of insulin (HCC)   Obesity, Class III, BMI 40-49.9 (morbid obesity) (HCC)   COPD (chronic obstructive pulmonary disease) (HCC)   Pure hypercholesterolemia   Hypokalemia   Fever  Syncope Patient presented after being found standing in the sun.  Reportedly passed out today.  Was in the ER 4 days ago for similar event.  Likely multifactorial in origin. Echocardiogram reassuring No arrhythmias noted on telemetry Likely secondary to sepsis/bacteremia Plan: DC telemetry Vitals.  Protocol Therapy evaluations   Fever Klebsiella bacteremia Sepsis secondary to above Presents with fever up to 103.1.  Blood  culture positive for Klebsiella.  No leukocytosis.  Possible pneumonia versus edema on chest x-ray.  Patient reports "kidney infection".  CT abdomen pelvis negative for acute pyelonephritis.  Urinalysis equivocal for infection.  Respiratory viral panel negative.  COVID pending.  Chronic lymphedema lower extremities difficult to ascertain whether patient truly has a cellulitis.  Procalcitonin elevated however.  COVID-negative this Plan: Discontinue cefepime Start Rocephin 2 g daily Monitor vitals and fever curve Follow sensitivities  Possible CHF, ruled out Patient does report a history of "congestive heart failure" however echo in 2022 with normal EF and no diastolic dysfunction He is chronically on diuretic however BNP is elevated TTE with normal EF, no diastolic dysfunction Plan: 40 mg IV Lasix x1  Hypokalemia Monitor and replace as necessary  Essential hypertension PTA lisinopril IV Lasix today.  Home Lasix on hold  Depression Anxiety PTA Paxil  Diabetes SSI Heart healthy diet Check hemoglobin A1c  COPD No evidence of exacerbation PTA albuterol as needed  Anemia Unclear etiology, suspect anemia of chronic disease No indication for transfusion Trend CBC  Hyperlipidemia Not currently on any statin  Morbid obesity BMI 45  History hepatitis C No active issues  Opioid use disorder Confirms that he does take Suboxone We will restart   DVT prophylaxis: SQ Lovenox Code Status: Full Family Communication: None Disposition Plan: Status is: Inpatient Remains inpatient appropriate because: Klebsiella bacteremia and associated sepsis on IV antibiotics     Level of care: Telemetry Medical  Consultants:  None  Procedures:  None  Antimicrobials: Rocephin   Subjective: Seen and examined.  Resting comfortably in bed.  No visible distress.  Does endorse  diffuse arthritic pain  Objective: Vitals:   03/25/22 1929 03/26/22 0340 03/26/22 0500 03/26/22 0805   BP: (!) 118/57 132/73  122/73  Pulse: (!) 58 (!) 53  (!) 57  Resp: '17 17  16  '$ Temp: 98.7 F (37.1 C) 98 F (36.7 C)  98.3 F (36.8 C)  TempSrc:      SpO2: 100% 100%  99%  Weight:   128.9 kg   Height:        Intake/Output Summary (Last 24 hours) at 03/26/2022 1141 Last data filed at 03/26/2022 1111 Gross per 24 hour  Intake 420 ml  Output 1100 ml  Net -680 ml   Filed Weights   03/24/22 1925 03/25/22 0500 03/26/22 0500  Weight: 124.7 kg 127.6 kg 128.9 kg    Examination:  General exam: No acute distress.  Sitting up in chair Respiratory system: Bibasilar crackles.  No work of breathing.  Room air Cardiovascular system: S1-S2, RRR, no murmurs, 2+ pitting edema BLE Gastrointestinal system: Obese, soft, NT/ND, normal bowel sounds Central nervous system: Alert and oriented. No focal neurological deficits. Extremities: Symmetric 5 x 5 power. Skin: No rashes, lesions or ulcers Psychiatry: Judgement and insight appear normal. Mood & affect appropriate.     Data Reviewed: I have personally reviewed following labs and imaging studies  CBC: Recent Labs  Lab 03/21/22 1218 03/24/22 1927 03/25/22 0521  WBC 3.0* 4.9 7.1  HGB 10.7* 10.9* 10.1*  HCT 33.2* 33.4* 30.5*  MCV 94.1 92.3 91.6  PLT 87* 117* 89*   Basic Metabolic Panel: Recent Labs  Lab 03/21/22 1218 03/24/22 1927 03/24/22 2030 03/25/22 0521  NA 136 136  --  137  K 3.7 3.4*  --  4.0  CL 104 105  --  108  CO2 25 24  --  22  GLUCOSE 130* 125*  --  109*  BUN 8 11  --  12  CREATININE 1.15 1.22  --  1.15  CALCIUM 9.3 9.2  --  9.0  MG  --   --  1.6*  --    GFR: Estimated Creatinine Clearance: 73.7 mL/min (by C-G formula based on SCr of 1.15 mg/dL). Liver Function Tests: Recent Labs  Lab 03/21/22 1218 03/25/22 0521  AST 71* 62*  ALT 47* 42  ALKPHOS 62 54  BILITOT 2.4* 2.5*  PROT 6.3* 5.7*  ALBUMIN 2.7* 2.4*   No results for input(s): "LIPASE", "AMYLASE" in the last 168 hours. No results for input(s):  "AMMONIA" in the last 168 hours. Coagulation Profile: No results for input(s): "INR", "PROTIME" in the last 168 hours. Cardiac Enzymes: Recent Labs  Lab 03/24/22 1957  CKTOTAL 614*   BNP (last 3 results) No results for input(s): "PROBNP" in the last 8760 hours. HbA1C: No results for input(s): "HGBA1C" in the last 72 hours. CBG: Recent Labs  Lab 03/26/22 0634 03/26/22 0701  GLUCAP 65* 81   Lipid Profile: No results for input(s): "CHOL", "HDL", "LDLCALC", "TRIG", "CHOLHDL", "LDLDIRECT" in the last 72 hours. Thyroid Function Tests: No results for input(s): "TSH", "T4TOTAL", "FREET4", "T3FREE", "THYROIDAB" in the last 72 hours. Anemia Panel: No results for input(s): "VITAMINB12", "FOLATE", "FERRITIN", "TIBC", "IRON", "RETICCTPCT" in the last 72 hours. Sepsis Labs: Recent Labs  Lab 03/24/22 1929 03/24/22 2030 03/24/22 2225 03/25/22 0521 03/26/22 0445  PROCALCITON  --  0.11  --  1.52 1.06  LATICACIDVEN 2.5*  --  1.8  --   --     Recent Results (from the past 240 hour(s))  Blood culture (  routine x 2)     Status: Abnormal (Preliminary result)   Collection Time: 03/24/22  7:34 PM   Specimen: BLOOD  Result Value Ref Range Status   Specimen Description   Final    BLOOD RIGHT ANTECUBITAL Performed at [facility], [address], [place], [place] [address]    Special Requests   Final    BOTTLES DRAWN AEROBIC AND ANAEROBIC Blood Culture results may not be optimal due to an excessive volume of blood received in culture bottles Performed at [facility], [address] [place], [place] [address]    Culture  Setup Time   Final    Organism ID to follow [place] AEROBIC AND ANAEROBIC BOTTLES CRITICAL RESULT CALLED TO, READ BACK BY AND VERIFIED WITH: WILL ANDERSON '@0835'$  03/25/22 MJU Performed at [facility], [address], [place], [place] [address]    Culture (A)  Final    KLEBSIELLA PNEUMONIAE SUSCEPTIBILITIES TO  FOLLOW Performed at [facility], [place] [address] [place], [place] [address]    Report Status PENDING  Incomplete  Blood Culture ID Panel (Reflexed)     Status: Abnormal   Collection Time: 03/24/22  7:34 PM  Result Value Ref Range Status   Enterococcus faecalis NOT DETECTED NOT DETECTED Final   Enterococcus Faecium NOT DETECTED NOT DETECTED Final   Listeria monocytogenes NOT DETECTED NOT DETECTED Final   Staphylococcus species NOT DETECTED NOT DETECTED Final   Staphylococcus aureus (BCID) NOT DETECTED NOT DETECTED Final   Staphylococcus epidermidis NOT DETECTED NOT DETECTED Final   Staphylococcus lugdunensis NOT DETECTED NOT DETECTED Final   Streptococcus species NOT DETECTED NOT DETECTED Final   Streptococcus agalactiae NOT DETECTED NOT DETECTED Final   Streptococcus pneumoniae NOT DETECTED NOT DETECTED Final   Streptococcus pyogenes NOT DETECTED NOT DETECTED Final   A.calcoaceticus-baumannii NOT DETECTED NOT DETECTED Final   Bacteroides fragilis NOT DETECTED NOT DETECTED Final   Enterobacterales DETECTED (A) NOT DETECTED Final    Comment: Enterobacterales represent a large order of gram negative bacteria, not a single organism. CRITICAL RESULT CALLED TO, READ BACK BY AND VERIFIED WITH: WILL ANDERSON '@0835'$  03/25/22 MJU    Enterobacter cloacae complex NOT DETECTED NOT DETECTED Final   Escherichia coli NOT DETECTED NOT DETECTED Final   Klebsiella aerogenes NOT DETECTED NOT DETECTED Final   Klebsiella oxytoca NOT DETECTED NOT DETECTED Final   Klebsiella pneumoniae DETECTED (A) NOT DETECTED Final    Comment: CRITICAL RESULT CALLED TO, READ BACK BY AND VERIFIED WITH: WILL ANDERSON '@0835'$  03/25/22 MJU    Proteus species NOT DETECTED NOT DETECTED Final   Salmonella species NOT DETECTED NOT DETECTED Final   Serratia marcescens NOT DETECTED NOT DETECTED Final   Haemophilus influenzae NOT DETECTED NOT DETECTED Final   Neisseria meningitidis NOT DETECTED NOT DETECTED Final    Pseudomonas aeruginosa NOT DETECTED NOT DETECTED Final   Stenotrophomonas maltophilia NOT DETECTED NOT DETECTED Final   Candida albicans NOT DETECTED NOT DETECTED Final   Candida auris NOT DETECTED NOT DETECTED Final   Candida glabrata NOT DETECTED NOT DETECTED Final   Candida krusei NOT DETECTED NOT DETECTED Final   Candida parapsilosis NOT DETECTED NOT DETECTED Final   Candida tropicalis NOT DETECTED NOT DETECTED Final   Cryptococcus neoformans/gattii NOT DETECTED NOT DETECTED Final   CTX-M ESBL NOT DETECTED NOT DETECTED Final   Carbapenem resistance IMP NOT DETECTED NOT DETECTED Final   Carbapenem resistance KPC NOT DETECTED NOT DETECTED Final   Carbapenem resistance  NDM NOT DETECTED NOT DETECTED Final   Carbapenem resist OXA 48 LIKE NOT DETECTED NOT DETECTED Final   Carbapenem resistance VIM NOT DETECTED NOT DETECTED Final    Comment: Performed at [facility], [address] [employer], [place] [address]  Blood culture (routine x 2)     Status: None (Preliminary result)   Collection Time: 03/24/22  7:39 PM   Specimen: BLOOD  Result Value Ref Range Status   Specimen Description BLOOD LEFT ANTECUBITAL  Final   Special Requests   Final    BOTTLES DRAWN AEROBIC AND ANAEROBIC Blood Culture adequate volume   Culture  Setup Time   Final    AEROBIC BOTTLE ONLY GRAM NEGATIVE RODS CRITICAL RESULT CALLED TO, READ BACK BY AND VERIFIED WITH: WILL ANDERSON '@0835'$  03/25/22 MJU Performed at [facility], [address] [place], [place] [address]    Culture GRAM NEGATIVE RODS  Final   Report Status PENDING  Incomplete  Respiratory (~20 pathogens) panel by PCR     Status: None   Collection Time: 03/24/22 10:18 PM   Specimen: Nasopharyngeal Swab; Respiratory  Result Value Ref Range Status   Adenovirus NOT DETECTED NOT DETECTED Final   Coronavirus 229E NOT DETECTED NOT DETECTED Final    Comment: (NOTE) The Coronavirus on the Respiratory Panel, DOES NOT test for the novel   Coronavirus (2019 nCoV)    Coronavirus HKU1 NOT DETECTED NOT DETECTED Final   Coronavirus NL63 NOT DETECTED NOT DETECTED Final   Coronavirus OC43 NOT DETECTED NOT DETECTED Final   Metapneumovirus NOT DETECTED NOT DETECTED Final   Rhinovirus / Enterovirus NOT DETECTED NOT DETECTED Final   Influenza A NOT DETECTED NOT DETECTED Final   Influenza B NOT DETECTED NOT DETECTED Final   Parainfluenza Virus 1 NOT DETECTED NOT DETECTED Final   Parainfluenza Virus 2 NOT DETECTED NOT DETECTED Final   Parainfluenza Virus 3 NOT DETECTED NOT DETECTED Final   Parainfluenza Virus 4 NOT DETECTED NOT DETECTED Final   Respiratory Syncytial Virus NOT DETECTED NOT DETECTED Final   Bordetella pertussis NOT DETECTED NOT DETECTED Final   Bordetella Parapertussis NOT DETECTED NOT DETECTED Final   Chlamydophila pneumoniae NOT DETECTED NOT DETECTED Final   Mycoplasma pneumoniae NOT DETECTED NOT DETECTED Final    Comment: Performed at [facility], [place] [address] [place], [place] [address]  SARS Coronavirus 2 by RT PCR (hospital order, performed in [facility] hospital lab) *cepheid single result test* Anterior Nasal Swab     Status: None   Collection Time: 03/25/22 10:15 AM   Specimen: Anterior Nasal Swab  Result Value Ref Range Status   SARS Coronavirus 2 by RT PCR NEGATIVE NEGATIVE Final    Comment: (NOTE) SARS-CoV-2 target nucleic acids are NOT DETECTED.  The SARS-CoV-2 RNA is generally detectable in upper and lower respiratory specimens during the acute phase of infection. The lowest concentration of SARS-CoV-2 viral copies this assay can detect is 250 copies / mL. A negative result does not preclude SARS-CoV-2 infection and should not be used as the sole basis for treatment or other patient management decisions.  A negative result may occur with improper specimen collection / handling, submission of specimen other than nasopharyngeal swab, presence of viral mutation(s) within the areas targeted  by this assay, and inadequate number of viral copies (<250 copies / mL). A negative result must be combined with clinical observations, patient history, and epidemiological information.  Fact Sheet for Patients:   https://www.patel.info/  Fact Sheet for Healthcare  Providers: https://hall.com/  This test is not yet approved or  cleared by the [place] and has been authorized for detection and/or diagnosis of SARS-CoV-2 by FDA under an Emergency Use Authorization (EUA).  This EUA will remain in effect (meaning this test can be used) for the duration of the COVID-19 declaration under Section 564(b)(1) of the Act, 21 U.S.C. section 360bbb-3(b)(1), unless the authorization is terminated or revoked sooner.  Performed at [facility], [address] [place], [place] [address]          Radiology Studies: US Venous Img Lower Bilateral (DVT)  Result Date: 03/26/2022 CLINICAL DATA:  71 year old male with bilateral leg swelling and pain. EXAM: BILATERAL LOWER EXTREMITY VENOUS DOPPLER ULTRASOUND TECHNIQUE: Gray-scale sonography with graded compression, as well as color Doppler and duplex ultrasound were performed to evaluate the lower extremity deep venous systems from the level of the common femoral vein and including the common femoral, femoral, profunda femoral, popliteal and calf veins including the posterior tibial, peroneal and gastrocnemius veins when visible. The superficial great saphenous vein was also interrogated. Spectral Doppler was utilized to evaluate flow at rest and with distal augmentation maneuvers in the common femoral, femoral and popliteal veins. COMPARISON:  None Available. FINDINGS: RIGHT LOWER EXTREMITY Common Femoral Vein: No evidence of thrombus. Normal compressibility, respiratory phasicity and response to augmentation. Saphenofemoral Junction: No evidence of thrombus. Normal compressibility and flow on color  Doppler imaging. Profunda Femoral Vein: No evidence of thrombus. Normal compressibility and flow on color Doppler imaging. Femoral Vein: No evidence of thrombus. Normal compressibility, respiratory phasicity and response to augmentation. Popliteal Vein: No evidence of thrombus. Normal compressibility, respiratory phasicity and response to augmentation. Calf Veins: The peroneal vein is not visualized. No evidence of thrombus. Normal compressibility and flow on color Doppler imaging. Other Findings:  None. LEFT LOWER EXTREMITY Common Femoral Vein: No evidence of thrombus. Normal compressibility, respiratory phasicity and response to augmentation. Saphenofemoral Junction: No evidence of thrombus. Normal compressibility and flow on color Doppler imaging. Profunda Femoral Vein: No evidence of thrombus. Normal compressibility and flow on color Doppler imaging. Femoral Vein: No evidence of thrombus. Normal compressibility, respiratory phasicity and response to augmentation. Popliteal Vein: The peroneal vein is not visualized. No evidence of thrombus. Normal compressibility, respiratory phasicity and response to augmentation. Calf Veins: No evidence of thrombus. Normal compressibility and flow on color Doppler imaging. Other Findings:  None. IMPRESSION: No evidence of bilateral lower extremity deep venous thrombosis. Ruthann Cancer, MD [facility] Radiology Electronically Signed   By: Ruthann Cancer M.D.   On: 03/26/2022 07:57   ECHOCARDIOGRAM COMPLETE  Result Date: 03/25/2022    ECHOCARDIOGRAM REPORT   Patient Name:   NASHAWN HILLOCK Date of Exam: 03/25/2022 Medical Rec #:  [id number]      Height:       66.0 in Accession #:    [id number]     Weight:       281.3 lb Date of Birth:  Nov 12, 1950     BSA:          2.312 m Patient Age:    75 years       BP:           134/66 mmHg Patient Gender: M              HR:           56 bpm. Exam Location:  [facility] Procedure: 2D Echo and  Intracardiac Opacification Agent Indications:  Syncope  History:         Patient has prior history of Echocardiogram examinations. COPD;                  Risk Factors:Hypertension, Diabetes, Dyslipidemia and 66/281lbs                  Morbid Obesity.  Sonographer:     L. Thornton-Maynard Referring Phys:  [id number] [place] Diagnosing Phys: Neoma Laming  Sonographer Comments: Image acquisition challenging due to patient body habitus. IMPRESSIONS  1. Left ventricular ejection fraction, by estimation, is 60 to 65%. The left ventricle has normal function. The left ventricle has no regional wall motion abnormalities. Left ventricular diastolic parameters were normal.  2. Right ventricular systolic function is normal. The right ventricular size is normal.  3. The mitral valve is normal in structure. No evidence of mitral valve regurgitation. No evidence of mitral stenosis.  4. The aortic valve is normal in structure. Aortic valve regurgitation is not visualized. Aortic valve sclerosis is present, with no evidence of aortic valve stenosis.  5. The inferior vena cava is normal in size with greater than 50% respiratory variability, suggesting right atrial pressure of 3 mmHg. FINDINGS  Left Ventricle: Left ventricular ejection fraction, by estimation, is 60 to 65%. The left ventricle has normal function. The left ventricle has no regional wall motion abnormalities. Definity contrast agent was given IV to delineate the left ventricular  endocardial borders. The left ventricular internal cavity size was normal in size. There is no left ventricular hypertrophy. Left ventricular diastolic parameters were normal. Right Ventricle: The right ventricular size is normal. No increase in right ventricular wall thickness. Right ventricular systolic function is normal. Left Atrium: Left atrial size was normal in size. Right Atrium: Right atrial size was normal in size. Pericardium: There is no evidence of pericardial effusion.  Mitral Valve: The mitral valve is normal in structure. No evidence of mitral valve regurgitation. No evidence of mitral valve stenosis. Tricuspid Valve: The tricuspid valve is normal in structure. Tricuspid valve regurgitation is not demonstrated. No evidence of tricuspid stenosis. Aortic Valve: The aortic valve is normal in structure. Aortic valve regurgitation is not visualized. Aortic valve sclerosis is present, with no evidence of aortic valve stenosis. Aortic valve peak gradient measures 13.5 mmHg. Pulmonic Valve: The pulmonic valve was normal in structure. Pulmonic valve regurgitation is not visualized. No evidence of pulmonic stenosis. Aorta: The aortic root is normal in size and structure. Venous: The inferior vena cava is normal in size with greater than 50% respiratory variability, suggesting right atrial pressure of 3 mmHg. IAS/Shunts: No atrial level shunt detected by color flow Doppler.  LEFT VENTRICLE PLAX 2D LVOT diam:     2.40 cm      Diastology LV SV:         99           LV e' medial:    7.51 cm/s LV SV Index:   43           LV E/e' medial:  14.6 LVOT Area:     4.52 cm     LV e' lateral:   9.29 cm/s                             LV E/e' lateral: 11.8  LV Volumes (MOD) LV vol d, MOD A2C: 104.0 ml LV vol d, MOD A4C: 110.0 ml LV vol s,  MOD A2C: 32.8 ml LV vol s, MOD A4C: 43.8 ml LV SV MOD A2C:     71.2 ml LV SV MOD A4C:     110.0 ml LV SV MOD BP:      70.2 ml RIGHT VENTRICLE RV S prime:     12.40 cm/s TAPSE (M-mode): 2.9 cm LEFT ATRIUM              Index        RIGHT ATRIUM           Index LA Vol (A2C):   78.2 ml  33.83 ml/m  RA Area:     19.30 cm LA Vol (A4C):   104.0 ml 44.99 ml/m  RA Volume:   50.90 ml  22.02 ml/m LA Biplane Vol: 92.1 ml  39.84 ml/m  AORTIC VALVE                 PULMONIC VALVE AV Area (Vmax): 2.13 cm     PV Vmax:       1.19 m/s AV Vmax:        184.00 cm/s  PV Peak grad:  5.7 mmHg AV Peak Grad:   13.5 mmHg LVOT Vmax:      86.80 cm/s LVOT Vmean:     63.400 cm/s LVOT VTI:        0.219 m  AORTA Ao Root diam: 3.00 cm Ao Asc diam:  3.70 cm MITRAL VALVE MV Area (PHT): 3.03 cm     SHUNTS MV Decel Time: 250 msec     Systemic VTI:  0.22 m MV E velocity: 110.00 cm/s  Systemic Diam: 2.40 cm MV A velocity: 64.30 cm/s MV E/A ratio:  1.71 Shaukat Khan Electronically signed by Neoma Laming Signature Date/Time: 03/25/2022/11:45:39 AM    Final    CT ABDOMEN PELVIS W CONTRAST  Result Date: 03/25/2022 CLINICAL DATA:  Fever. Question sepsis. EXAM: CT ABDOMEN AND PELVIS WITH CONTRAST TECHNIQUE: Multidetector CT imaging of the abdomen and pelvis was performed using the standard protocol following bolus administration of intravenous contrast. RADIATION DOSE REDUCTION: This exam was performed according to the departmental dose-optimization program which includes automated exposure control, adjustment of the mA and/or kV according to patient size and/or use of iterative reconstruction technique. CONTRAST:  183m OMNIPAQUE IOHEXOL 300 MG/ML  SOLN COMPARISON:  CT of the abdomen and pelvis 02/17/2020 FINDINGS: Lower chest: Mild dependent atelectasis is present. A 4 mm pleural based nodule is noted in the left lower lobe on image 26 of series 4. Lungs are otherwise clear. Heart size is normal. Hepatobiliary: A somewhat nodular contour of the liver is again noted. No discrete lesions are present. The common bile duct is progressively enlarged, now measuring 27 mm. No obstructing lesion is present. Cholecystectomy noted. Pancreas: Unremarkable. No pancreatic ductal dilatation or surrounding inflammatory changes. Spleen: Normal in size without focal abnormality. Splenules are stable. Adrenals/Urinary Tract: The adrenal glands are within normal limits bilaterally. Three nonobstructing stones are present at the lower pole of the right kidney. The largest measures 7 mm. Additional punctate nonobstructing stone is present in the midportion of the right kidney. No significant left-sided nephrolithiasis. No obstruction is  present. Ureters are within normal limits. Urinary bladder is normal. Stomach/Bowel: The stomach and duodenum are within normal limits. Small bowel is unremarkable. Terminal ileum is normal. The appendix is visualized and within normal limits. The ascending and transverse colon are normal. The descending and sigmoid colon are normal. Vascular/Lymphatic: R subcentimeter para-aortic nodes are present. A single  enlarged node at the level of the IMA takeoff has slightly increased in size, now measuring 12.5 mm. It was 11 mm previously. No other significant adenopathy is present. Reproductive: Prostate is unremarkable. Other: No abdominal wall hernia or abnormality. No abdominopelvic ascites. Musculoskeletal: No acute or significant osseous findings. Subcutaneous edema is present bilaterally. No discrete fluid collections are present. IMPRESSION: 1. No acute or focal lesion to explain the patient's fever. 2. Progressive dilation of the common bile duct, now measuring 27 mm. No obstructing lesion is present. This could be related to prior cholecystectomy. Recommend correlation with liver function tests. 3. Nonobstructing stones at the lower pole of the right kidney. 4. Slight increase in size of a single enlarged node at the level of the IMA takeoff, now measuring 12.5 mm. It was 11 mm previously. No other significant adenopathy is present. 5. 4 mm pleural based nodule in the left lower lobe. Electronically Signed   By: San Morelle M.D.   On: 03/25/2022 11:12   DG Chest Portable 1 View  Result Date: 03/24/2022 CLINICAL DATA:  Fever. EXAM: PORTABLE CHEST 1 VIEW COMPARISON:  Chest radiograph 03/21/2022; CT angio chest 03/18/2020. FINDINGS: Stable cardiomediastinal silhouette. Mild tortuosity of the thoracic aorta with aortic calcifications. New diffuse interstitial opacities. No focal consolidation, large volume pleural effusion, or pneumothorax. Visualized osseous structures are unremarkable. IMPRESSION: New  diffuse interstitial opacities may reflect mild pulmonary edema or possibly atypical infection in the appropriate clinical setting. Electronically Signed   By: Ileana Roup M.D.   On: 03/24/2022 21:16   DG Hand Complete Right  Result Date: 03/24/2022 CLINICAL DATA:  Syncope.  Abrasion to left pinky finger. EXAM: RIGHT HAND - COMPLETE 3+ VIEW COMPARISON:  None Available. FINDINGS: No fractures or dislocations. The fifth finger is flexed at the distal interphalangeal joint. No fractures. IMPRESSION: No fractures or foreign bodies. The fifth finger is flexed at the distal interphalangeal joint. This is an age-indeterminate finding. Recommend clinical correlation. Electronically Signed   By: Dorise Bullion III M.D.   On: 03/24/2022 20:02        Scheduled Meds:  buprenorphine-naloxone  1 tablet Sublingual Daily   enoxaparin (LOVENOX) injection  0.5 mg/kg Subcutaneous Q24H   lisinopril  30 mg Oral Daily   methocarbamol  750 mg Oral TID   pantoprazole  40 mg Oral Daily   PARoxetine  10 mg Oral Daily   sodium chloride flush  3 mL Intravenous Q12H   Continuous Infusions:  cefTRIAXone (ROCEPHIN)  IV       LOS: 1 day    Sidney Ace, MD [facility]   If 7PM-7AM, please contact night-coverage  03/26/2022, 11:41 AM

## 2022-03-26 NOTE — Progress Notes (Signed)
       CROSS COVER NOTE  NAME: [name] MRN: [id number] DOB : 1950/12/06    Date of Service   03/26/2022  HPI/Events of Note   Notified by nursing of a hypoglycemic event. CBG 65 at 0634. Recheck CBG 81 after juice.  Interventions   Plan: Treated per Standing Hypoglycemia protocol q4H CBG monitoring for 12hrs 12.5g D50 Once PRN ordered placed.      This document was prepared using Dragon voice recognition software and may include unintentional dictation errors.  Neomia Glass DNP, MHA, FNP-BC Nurse Practitioner [facility] Pager [phone]

## 2022-03-26 NOTE — NC FL2 (Signed)
[place] LEVEL OF CARE SCREENING TOOL     IDENTIFICATION  Patient Name: [name] Birthdate: [birth year] Sex: male Admission Date (Current Location): 03/24/2022  [facility] and [place] Number:  Engineering geologist and Address:  [facility], [address], [place], [place] [address]      Provider Number: [id number]  Attending Physician Name and Address:  Sidney Ace, MD  Relative Name and Phone Number:       Current Level of Care: Hospital Recommended Level of Care: [place] Prior Approval Number:    Date Approved/Denied:   PASRR Number: [id number]  Discharge Plan: Other (Comment)    Current Diagnoses: Patient Active Problem List   Diagnosis Date Noted   Syncope and collapse 03/24/2022   Hypokalemia 03/24/2022   Fever 03/24/2022   COPD (chronic obstructive pulmonary disease) (HCC)    Elevated troponin    Abnormal LFTs    History of hepatitis C    AKI (acute kidney injury) ([place])    Pure hypercholesterolemia    Pneumonia due to COVID-19 virus 04/19/2020   Elevated transaminase level 04/18/2020   Obesity, Class III, BMI 40-49.9 (morbid obesity) ([place]) 04/18/2020   Lactic acidosis 04/17/2020   Thrombocytopenia ([place]) 04/17/2020   Leukopenia 04/17/2020   COVID-19 virus infection 04/16/2020   HTN (hypertension) 04/17/2018   Uncontrolled type 2 diabetes mellitus with hyperglycemia, without long-term current use of insulin ([place]) 04/17/2018   Lymphedema 04/17/2018   Anemia 04/07/2018   Bilateral leg pain 03/29/2018   Chest pain 03/20/2018   Adjustment disorder with mixed anxiety and depressed mood 12/16/2017   Homelessness 12/16/2017    Orientation RESPIRATION BLADDER Height & Weight     Self, Time, Situation, Place  Normal Continent Weight: 128.9 kg Height:  '5\' 6"'$  (167.6 cm)  BEHAVIORAL SYMPTOMS/MOOD NEUROLOGICAL BOWEL NUTRITION STATUS      Continent Diet (see dc summary)  AMBULATORY STATUS  COMMUNICATION OF NEEDS Skin   Limited Assist   Normal                       Personal Care Assistance Level of Assistance  Bathing, Feeding, Dressing Bathing Assistance: Limited assistance Feeding assistance: Independent Dressing Assistance: Limited assistance     Functional Limitations Info             SPECIAL CARE FACTORS FREQUENCY                       Contractures Contractures Info: Not present    Additional Factors Info                  Current Medications (03/26/2022):  This is the current hospital active medication list Current Facility-Administered Medications  Medication Dose Route Frequency Provider Last Rate Last Admin   acetaminophen (TYLENOL) tablet 650 mg  650 mg Oral Q6H PRN Marcelyn Bruins, MD       Or   acetaminophen (TYLENOL) suppository 650 mg  650 mg Rectal Q6H PRN Marcelyn Bruins, MD       albuterol (PROVENTIL) (2.5 MG/3ML) 0.083% nebulizer solution 3 mL  3 mL Inhalation Q6H PRN Marcelyn Bruins, MD       alum & mag hydroxide-simeth (MAALOX/MYLANTA) 200-200-20 MG/5ML suspension 30 mL  30 mL Oral Q6H PRN Sharion Settler, NP   30 mL at 03/25/22 2018   buprenorphine-naloxone (SUBOXONE) 2-0.5 mg per SL tablet 1 tablet  1 tablet Sublingual Daily Priscella Mann, Sudheer  B, MD   1 tablet at 03/26/22 1005   cefTRIAXone (ROCEPHIN) 2 g in sodium chloride 0.9 % 100 mL IVPB  2 g Intravenous Q24H Wynelle Cleveland, RPH   Stopped at 03/26/22 1322   dextrose 50 % solution 12.5 g  12.5 g Intravenous Once PRN Foust, Katy L, NP       enoxaparin (LOVENOX) injection 65 mg  0.5 mg/kg Subcutaneous Q24H Mickeal Skinner A, RPH       insulin aspart (novoLOG) injection 0-15 Units  0-15 Units Subcutaneous TID WC Sreenath, Sudheer B, MD       insulin aspart (novoLOG) injection 0-5 Units  0-5 Units Subcutaneous QHS Sreenath, Sudheer B, MD       lisinopril (ZESTRIL) tablet 30 mg  30 mg Oral Daily Marcelyn Bruins, MD   30 mg at 03/26/22 1006   methocarbamol  (ROBAXIN) tablet 750 mg  750 mg Oral TID Ralene Muskrat B, MD   750 mg at 03/26/22 1006   pantoprazole (PROTONIX) EC tablet 40 mg  40 mg Oral Daily Marcelyn Bruins, MD   40 mg at 03/26/22 1006   PARoxetine (PAXIL) tablet 10 mg  10 mg Oral Daily Marcelyn Bruins, MD   10 mg at 03/26/22 1006   polyethylene glycol (MIRALAX / GLYCOLAX) packet 17 g  17 g Oral Daily PRN Marcelyn Bruins, MD       sodium chloride flush (NS) 0.9 % injection 3 mL  3 mL Intravenous Q12H Marcelyn Bruins, MD   3 mL at 03/26/22 1007     Discharge Medications: Please see discharge summary for a list of discharge medications.  Relevant Imaging Results:  Relevant Lab Results:   Additional Information [id number]  Conception Oms, RN

## 2022-03-26 NOTE — Evaluation (Signed)
Physical Therapy Evaluation Patient Details Name: [name] MRN: [id number] DOB: Jan 06, 1951 Today's Date: 03/26/2022  History of Present Illness  Pt is a 71 y/o M admitted on 03/24/22 after presenting with c/o episode of LOC/syncope & fever. Of note, pt was in the ED 3 days prior for syncope. Pt also found to have sepsis 2/2 klebsiella bacteremia. PMH: HTN, HLD, thrombocytopenia, transaminitis, Hep C, anxiety, depression, DM, anemia, lymphadema, obesity, COPD  Clinical Impression  Pt seen for PT evaluation with pt reporting he has been homeless ~1 week. Pt reports he was independent without AD 2-3 months ago but has started using a cane 2/2 BLE pain & weakness. Pt notes 6-8 falls in the past 2 weeks & decreased ability to negotiate stairs. On this date, pt is able to transfer STS with supervision & tolerate standing with supervision. Pt is able to ambulate to door & back without AD with steady gait but reports increasing back pain & listing R but this wasn't observed. Pt was negative for orthostatic hypotension (see below) but does endorse feeling "dizzy" at all times, even prior to admission. Will continue to follow pt acutely to address balance, endurance, and gait & stairs with LRAD. Notified pt's c/o increasing back pain since a week ago with MD reporting he will order imaging.  BP checked in LUE: Sitting: 124/67 mmHg MAP 85, HR 59 bpm Standing at 2 minutes: 119/60 mmHg MAP 79, HR 59 bpm Sitting in recliner at end of session: 127/60 mmHg MAP 80, HR 60 bpm  Of note, pt reports he has decreased vision in R eye.     Recommendations for follow up therapy are one component of a multi-disciplinary discharge planning process, led by the attending physician.  Recommendations may be updated based on patient status, additional functional criteria and insurance authorization.  Follow Up Recommendations Home health PT      Assistance Recommended at Discharge Intermittent Supervision/Assistance   Patient can return home with the following  A little help with walking and/or transfers;A little help with bathing/dressing/bathroom;Assistance with cooking/housework;Assist for transportation;Help with stairs or ramp for entrance    Equipment Recommendations Rolling walker (2 wheels)  Recommendations for Other Services       Functional Status Assessment Patient has had a recent decline in their functional status and demonstrates the ability to make significant improvements in function in a reasonable and predictable amount of time.     Precautions / Restrictions Precautions Precautions: Fall Restrictions Weight Bearing Restrictions: No      Mobility  Bed Mobility               General bed mobility comments: not observed, pt received & left in recliner    Transfers Overall transfer level: Needs assistance Equipment used: None Transfers: Sit to/from Stand Sit to Stand: Supervision           General transfer comment: STS from recliner with supervision    Ambulation/Gait Ambulation/Gait assistance: Supervision, Min guard Gait Distance (Feet): 50 Feet Assistive device: None Gait Pattern/deviations: Decreased step length - right, Decreased step length - left, Decreased stride length Gait velocity: decreased     General Gait Details: Pt ambulates recliner<>door & back without AD with supervision but increasing to CGA as pt reports increasing back pain & listing to R but this was not observed by PT.  Stairs            Wheelchair Mobility    Modified Rankin (Stroke Patients Only)  Balance Overall balance assessment: Needs assistance, History of Falls Sitting-balance support: Feet supported Sitting balance-Leahy Scale: Good     Standing balance support: During functional activity, No upper extremity supported Standing balance-Leahy Scale: Fair                               Pertinent Vitals/Pain Pain Assessment Pain Assessment:  Faces Faces Pain Scale: Hurts little more Pain Location: chronic back & BLE pain & pain in back since ~1 week ago Pain Descriptors / Indicators: Discomfort Pain Intervention(s): Limited activity within patient's tolerance, Monitored during session, Repositioned    Home Living Family/patient expects to be discharged to:: Shelter/Homeless                   Additional Comments: Pt reports he recently lost his home ~1 week ago 2/2 landlord not renewing lease, so pt has been homeless. Pt reports he has no family/friends/neighbors that can assist at d/c but states his doctor (pt cannot recall his name)    Prior Function               Mobility Comments: Pt reports he was independent without AD until 2-3 months ago when he started using a cane 2/2 BLE edema & weakness. Pt also reports limiting back pain since ~1 week ago 2/2 injury in ambulance during last admission (but reports negative injury/imaging). Pt reports 6-8 falls in the past 2 weeks, noting he'll frequently trip over things/uneven ground & reports he "can't do stairs anymore".       Hand Dominance        Extremity/Trunk Assessment   Upper Extremity Assessment Upper Extremity Assessment: Overall WFL for tasks assessed    Lower Extremity Assessment Lower Extremity Assessment: Generalized weakness (BLE edema & redness noted)       Communication   Communication: No difficulties  Cognition Arousal/Alertness: Awake/alert Behavior During Therapy: WFL for tasks assessed/performed Overall Cognitive Status: Within Functional Limits for tasks assessed                                 General Comments: pt endorses decreased short term memory at baseline        General Comments      Exercises     Assessment/Plan    PT Assessment Patient needs continued PT services  PT Problem List Decreased strength;Pain;Decreased activity tolerance;Decreased balance;Decreased mobility;Decreased knowledge of use of  DME       PT Treatment Interventions DME instruction;Therapeutic exercise;Gait training;Balance training;Stair training;Neuromuscular re-education;Functional mobility training;Therapeutic activities;Patient/family education;Modalities;Manual techniques    PT Goals (Current goals can be found in the Care Plan section)  Acute Rehab PT Goals Patient Stated Goal: get better PT Goal Formulation: With patient Time For Goal Achievement: 04/09/22 Potential to Achieve Goals: Good    Frequency Min 2X/week     Co-evaluation               AM-PAC PT "6 Clicks" Mobility  Outcome Measure Help needed turning from your back to your side while in a flat bed without using bedrails?: None Help needed moving from lying on your back to sitting on the side of a flat bed without using bedrails?: None Help needed moving to and from a bed to a chair (including a wheelchair)?: A Little Help needed standing up from a chair using your arms (e.g., wheelchair or bedside chair)?: None  Help needed to walk in hospital room?: A Little Help needed climbing 3-5 steps with a railing? : A Little 6 Click Score: 21    End of Session   Activity Tolerance: Patient limited by pain Patient left: in chair;with call bell/phone within reach Nurse Communication: Mobility status PT Visit Diagnosis: Unsteadiness on feet (R26.81);History of falling (Z91.81);Muscle weakness (generalized) (M62.81);Pain Pain - Right/Left:  (mid) Pain - part of body:  (back)    Time: 0076-2263 PT Time Calculation (min) (ACUTE ONLY): 16 min   Charges:   PT Evaluation $PT Eval Low Complexity: Rouse, PT, DPT 03/26/22, 2:34 PM   [name] 03/26/2022, 2:31 PM

## 2022-03-26 NOTE — Plan of Care (Signed)
  Problem: Health Behavior/Discharge Planning: Goal: Ability to manage health-related needs will improve Outcome: Progressing   Problem: Clinical Measurements: Goal: Diagnostic test results will improve Outcome: Progressing Goal: Cardiovascular complication will be avoided Outcome: Progressing   Problem: Nutrition: Goal: Adequate nutrition will be maintained Outcome: Progressing   Problem: Pain Managment: Goal: General experience of comfort will improve Outcome: Progressing   Problem: Safety: Goal: Ability to remain free from injury will improve Outcome: Progressing

## 2022-03-27 DIAGNOSIS — R55 Syncope and collapse: Secondary | ICD-10-CM | POA: Diagnosis not present

## 2022-03-27 LAB — CULTURE, BLOOD (ROUTINE X 2): Special Requests: ADEQUATE

## 2022-03-27 LAB — GLUCOSE, CAPILLARY
Glucose-Capillary: 135 mg/dL — ABNORMAL HIGH (ref 70–99)
Glucose-Capillary: 125 mg/dL — ABNORMAL HIGH (ref 70–99)
Glucose-Capillary: 107 mg/dL — ABNORMAL HIGH (ref 70–99)

## 2022-03-27 MED ORDER — DIPHENHYDRAMINE HCL 25 MG PO CAPS
25.0000 mg | ORAL_CAPSULE | Freq: Once | ORAL | Status: AC
  Administered 2022-03-27: 25 mg via ORAL
  Filled 2022-03-27: qty 1

## 2022-03-27 MED ORDER — METHOCARBAMOL 500 MG PO TABS
750.0000 mg | ORAL_TABLET | Freq: Four times a day (QID) | ORAL | Status: DC
  Administered 2022-03-27 – 2022-03-29 (×7): 750 mg via ORAL
  Filled 2022-03-27 (×7): qty 2

## 2022-03-27 NOTE — Progress Notes (Signed)
       CROSS COVER NOTE  NAME: [name] MRN: [id number] DOB : 05-22-1951    Date of Service   03/27/2022  HPI/Events of Note   Medication request received from nursing for bilateral lower extremity pruritus.  Interventions   Plan: PO Benadryl x1      This document was prepared using Dragon voice recognition software and may include unintentional dictation errors.  Neomia Glass DNP, MHA, FNP-BC Nurse Practitioner [facility] Pager [phone]

## 2022-03-27 NOTE — Progress Notes (Signed)
\ PROGRESS NOTE    ABASS MISENER  BJS:[id number] DOB: 05-31-1951 DOA: 03/24/2022 PCP: Denton Lank, MD    Brief Narrative:   71 y.o. male with medical history significant of hypertension, hyperlipidemia, thrombocytopenia, transaminitis, hepatitis C, anxiety, depression, diabetes, anemia, lymphedema, obesity, COPD presenting with episode of loss of consciousness/syncope and fever.   Patient reportedly found down by a bystander.  He has been found standing in the sun he reports he passed out earlier today.  He stated felt well this morning and went for a walk later in the day and tripped over a curb as best he could remember.  Of note, he was in the ED for syncope 3 days ago, reporting several episodes without prodrome.   He also reports some mild increase shortness of breath last few days and possibly increase in his chronic edema.  Blood cultures positive for gram-negative rods.  Speciation with Klebsiella pneumonia. Sensitive to third-generation cephalosporins.  Switched to ceftriaxone.  Echocardiogram without evidence of heart failure.  Assessment & Plan:   Principal Problem:   Syncope and collapse Active Problems:   Adjustment disorder with mixed anxiety and depressed mood   Anemia   HTN (hypertension)   Uncontrolled type 2 diabetes mellitus with hyperglycemia, without long-term current use of insulin (HCC)   Obesity, Class III, BMI 40-49.9 (morbid obesity) (HCC)   COPD (chronic obstructive pulmonary disease) (HCC)   Pure hypercholesterolemia   Hypokalemia   Fever  Syncope Patient presented after being found standing in the sun.  Reportedly passed out today.  Was in the ER 4 days ago for similar event.  Likely multifactorial in origin. Echocardiogram reassuring No arrhythmias noted on telemetry Likely secondary to sepsis/bacteremia Plan: Vitals per unit protocol Therapy evaluations Fall precautions   Fever Klebsiella bacteremia Sepsis secondary to above Presents with  fever up to 103.1.  Blood culture positive for Klebsiella.  No leukocytosis.  Possible pneumonia versus edema on chest x-ray.  Patient reports "kidney infection".  CT abdomen pelvis negative for acute pyelonephritis.  Urinalysis equivocal for infection.  Respiratory viral panel negative.  COVID pending.  Chronic lymphedema lower extremities difficult to ascertain whether patient truly has a cellulitis.  Procalcitonin elevated however.  COVID-negative Plan: Continue Rocephin 2 g daily Monitor vitals and fever curve  Possible CHF, ruled out Patient does report a history of "congestive heart failure" however echo in 2022 with normal EF and no diastolic dysfunction He is chronically on diuretic however BNP is elevated TTE with normal EF, no diastolic dysfunction Plan: Monitor volume status  Hypokalemia Monitor and replace as necessary  Essential hypertension PTA lisinopril No chronic diuretic therapy  Depression Anxiety PTA Paxil  History of diabetes Hemoglobin A1c 5.4 Heart healthy diet   COPD No evidence of exacerbation PTA albuterol as needed  Anemia Unclear etiology, suspect anemia of chronic disease No indication for transfusion Trend CBC  Hyperlipidemia Not currently on any statin  Morbid obesity BMI 45  History hepatitis C No active issues  Opioid use disorder Confirms that he does take Suboxone We will restart   DVT prophylaxis: SQ Lovenox Code Status: Full Family Communication: None Disposition Plan: Status is: Inpatient Remains inpatient appropriate because: Klebsiella bacteremia and associated sepsis on IV antibiotics.  Attempting to secure placement in assisted living facility.  Medically ready for discharge within 1 to 2 days.     Level of care: Med-Surg  Consultants:  None  Procedures:  None  Antimicrobials: Rocephin   Subjective: Seen and examined.  Resting  in bed.  No visible distress.  Arthritic pain though improved from  prior.  Objective: Vitals:   03/26/22 2023 03/27/22 0422 03/27/22 0737 03/27/22 1138  BP: 118/69 124/64 115/63 (!) 102/56  Pulse: (!) 59 (!) 59 (!) 57 (!) 52  Resp: '16 17 16 15  '$ Temp: 98.7 F (37.1 C) 98.1 F (36.7 C) 98.1 F (36.7 C) 98.1 F (36.7 C)  TempSrc: Oral Oral    SpO2: 100% 100% 100% 100%  Weight:      Height:        Intake/Output Summary (Last 24 hours) at 03/27/2022 1402 Last data filed at 03/27/2022 0424 Gross per 24 hour  Intake 100 ml  Output 1100 ml  Net -1000 ml   Filed Weights   03/24/22 1925 03/25/22 0500 03/26/22 0500  Weight: 124.7 kg 127.6 kg 128.9 kg    Examination:  General exam: NAD.  Lying comfortably in bed Respiratory system: Bibasilar crackles.  No work of breathing.  Room air Cardiovascular system: S1-S2, RRR, no murmurs, 1+ pitting edema BLE Gastrointestinal system: Obese, soft, NT/ND, normal bowel sounds Central nervous system: Alert and oriented. No focal neurological deficits. Extremities: Symmetric 5 x 5 power. Skin: No rashes, lesions or ulcers Psychiatry: Judgement and insight appear normal. Mood & affect appropriate.     Data Reviewed: I have personally reviewed following labs and imaging studies  CBC: Recent Labs  Lab 03/21/22 1218 03/24/22 1927 03/25/22 0521  WBC 3.0* 4.9 7.1  HGB 10.7* 10.9* 10.1*  HCT 33.2* 33.4* 30.5*  MCV 94.1 92.3 91.6  PLT 87* 117* 89*   Basic Metabolic Panel: Recent Labs  Lab 03/21/22 1218 03/24/22 1927 03/24/22 2030 03/25/22 0521  NA 136 136  --  137  K 3.7 3.4*  --  4.0  CL 104 105  --  108  CO2 25 24  --  22  GLUCOSE 130* 125*  --  109*  BUN 8 11  --  12  CREATININE 1.15 1.22  --  1.15  CALCIUM 9.3 9.2  --  9.0  MG  --   --  1.6*  --    GFR: Estimated Creatinine Clearance: 73.7 mL/min (by C-G formula based on SCr of 1.15 mg/dL). Liver Function Tests: Recent Labs  Lab 03/21/22 1218 03/25/22 0521  AST 71* 62*  ALT 47* 42  ALKPHOS 62 54  BILITOT 2.4* 2.5*  PROT 6.3* 5.7*   ALBUMIN 2.7* 2.4*   No results for input(s): "LIPASE", "AMYLASE" in the last 168 hours. No results for input(s): "AMMONIA" in the last 168 hours. Coagulation Profile: No results for input(s): "INR", "PROTIME" in the last 168 hours. Cardiac Enzymes: Recent Labs  Lab 03/24/22 1957  CKTOTAL 614*   BNP (last 3 results) No results for input(s): "PROBNP" in the last 8760 hours. HbA1C: Recent Labs    03/26/22 0445  HGBA1C 5.4   CBG: Recent Labs  Lab 03/26/22 0701 03/26/22 1203 03/26/22 1750 03/26/22 2120 03/27/22 1136  GLUCAP 81 105* 92 85 135*   Lipid Profile: No results for input(s): "CHOL", "HDL", "LDLCALC", "TRIG", "CHOLHDL", "LDLDIRECT" in the last 72 hours. Thyroid Function Tests: No results for input(s): "TSH", "T4TOTAL", "FREET4", "T3FREE", "THYROIDAB" in the last 72 hours. Anemia Panel: No results for input(s): "VITAMINB12", "FOLATE", "FERRITIN", "TIBC", "IRON", "RETICCTPCT" in the last 72 hours. Sepsis Labs: Recent Labs  Lab 03/24/22 1929 03/24/22 2030 03/24/22 2225 03/25/22 0521 03/26/22 0445  PROCALCITON  --  0.11  --  1.52 1.06  LATICACIDVEN 2.5*  --  1.8  --   --     Recent Results (from the past 240 hour(s))  Blood culture (routine x 2)     Status: Abnormal   Collection Time: 03/24/22  7:34 PM   Specimen: BLOOD  Result Value Ref Range Status   Specimen Description   Final    BLOOD RIGHT ANTECUBITAL Performed at [facility], [address], [place], [place] [address]    Special Requests   Final    BOTTLES DRAWN AEROBIC AND ANAEROBIC Blood Culture results may not be optimal due to an excessive volume of blood received in culture bottles Performed at [facility], [address] [place], [place] [address]    Culture  Setup Time   Final    Organism ID to follow Hoffman AND ANAEROBIC BOTTLES CRITICAL RESULT CALLED TO, READ BACK BY AND VERIFIED WITH: WILL ANDERSON '@0835'$  03/25/22 MJU Performed at  [facility], [address] [place], [place] [address]    Culture KLEBSIELLA PNEUMONIAE (A)  Final   Report Status 03/27/2022 FINAL  Final   Organism ID, Bacteria KLEBSIELLA PNEUMONIAE  Final      Susceptibility   Klebsiella pneumoniae - MIC*    AMPICILLIN RESISTANT Resistant     CEFAZOLIN <=4 SENSITIVE Sensitive     CEFEPIME <=0.12 SENSITIVE Sensitive     CEFTAZIDIME <=1 SENSITIVE Sensitive     CEFTRIAXONE <=0.25 SENSITIVE Sensitive     CIPROFLOXACIN <=0.25 SENSITIVE Sensitive     GENTAMICIN <=1 SENSITIVE Sensitive     IMIPENEM <=0.25 SENSITIVE Sensitive     TRIMETH/SULFA <=20 SENSITIVE Sensitive     AMPICILLIN/SULBACTAM <=2 SENSITIVE Sensitive     PIP/TAZO <=4 SENSITIVE Sensitive     * KLEBSIELLA PNEUMONIAE  Blood Culture ID Panel (Reflexed)     Status: Abnormal   Collection Time: 03/24/22  7:34 PM  Result Value Ref Range Status   Enterococcus faecalis NOT DETECTED NOT DETECTED Final   Enterococcus Faecium NOT DETECTED NOT DETECTED Final   Listeria monocytogenes NOT DETECTED NOT DETECTED Final   Staphylococcus species NOT DETECTED NOT DETECTED Final   Staphylococcus aureus (BCID) NOT DETECTED NOT DETECTED Final   Staphylococcus epidermidis NOT DETECTED NOT DETECTED Final   Staphylococcus lugdunensis NOT DETECTED NOT DETECTED Final   Streptococcus species NOT DETECTED NOT DETECTED Final   Streptococcus agalactiae NOT DETECTED NOT DETECTED Final   Streptococcus pneumoniae NOT DETECTED NOT DETECTED Final   Streptococcus pyogenes NOT DETECTED NOT DETECTED Final   A.calcoaceticus-baumannii NOT DETECTED NOT DETECTED Final   Bacteroides fragilis NOT DETECTED NOT DETECTED Final   Enterobacterales DETECTED (A) NOT DETECTED Final    Comment: Enterobacterales represent a large order of gram negative bacteria, not a single organism. CRITICAL RESULT CALLED TO, READ BACK BY AND VERIFIED WITH: WILL ANDERSON '@0835'$  03/25/22 MJU    Enterobacter cloacae complex NOT DETECTED NOT  DETECTED Final   Escherichia coli NOT DETECTED NOT DETECTED Final   Klebsiella aerogenes NOT DETECTED NOT DETECTED Final   Klebsiella oxytoca NOT DETECTED NOT DETECTED Final   Klebsiella pneumoniae DETECTED (A) NOT DETECTED Final    Comment: CRITICAL RESULT CALLED TO, READ BACK BY AND VERIFIED WITH: WILL ANDERSON '@0835'$  03/25/22 MJU    Proteus species NOT DETECTED NOT DETECTED Final   Salmonella species NOT DETECTED NOT DETECTED Final   Serratia marcescens NOT DETECTED NOT DETECTED Final   Haemophilus influenzae NOT DETECTED NOT DETECTED Final   Neisseria meningitidis NOT DETECTED NOT DETECTED Final  Pseudomonas aeruginosa NOT DETECTED NOT DETECTED Final   Stenotrophomonas maltophilia NOT DETECTED NOT DETECTED Final   Candida albicans NOT DETECTED NOT DETECTED Final   Candida auris NOT DETECTED NOT DETECTED Final   Candida glabrata NOT DETECTED NOT DETECTED Final   Candida krusei NOT DETECTED NOT DETECTED Final   Candida parapsilosis NOT DETECTED NOT DETECTED Final   Candida tropicalis NOT DETECTED NOT DETECTED Final   Cryptococcus neoformans/gattii NOT DETECTED NOT DETECTED Final   CTX-M ESBL NOT DETECTED NOT DETECTED Final   Carbapenem resistance IMP NOT DETECTED NOT DETECTED Final   Carbapenem resistance KPC NOT DETECTED NOT DETECTED Final   Carbapenem resistance NDM NOT DETECTED NOT DETECTED Final   Carbapenem resist OXA 48 LIKE NOT DETECTED NOT DETECTED Final   Carbapenem resistance VIM NOT DETECTED NOT DETECTED Final    Comment: Performed at [facility], [address] [place], [place] [address]  Blood culture (routine x 2)     Status: Abnormal   Collection Time: 03/24/22  7:39 PM   Specimen: BLOOD  Result Value Ref Range Status   Specimen Description   Final    BLOOD LEFT ANTECUBITAL Performed at [facility], [address] [place], [place] [address]    Special Requests   Final    BOTTLES DRAWN AEROBIC AND ANAEROBIC Blood Culture adequate  volume Performed at [facility], [address] [place], [place] [address]    Culture  Setup Time   Final    AEROBIC BOTTLE ONLY GRAM NEGATIVE RODS CRITICAL RESULT CALLED TO, READ BACK BY AND VERIFIED WITH: WILL ANDERSON '@0835'$  03/25/22 MJU Performed at [facility], [address] [place], [place] [address]    Culture (A)  Final    KLEBSIELLA PNEUMONIAE SUSCEPTIBILITIES PERFORMED ON PREVIOUS CULTURE WITHIN THE LAST 5 DAYS. Performed at [facility], [place] [address], [place], [place] [address]    Report Status 03/27/2022 FINAL  Final  Respiratory (~20 pathogens) panel by PCR     Status: None   Collection Time: 03/24/22 10:18 PM   Specimen: Nasopharyngeal Swab; Respiratory  Result Value Ref Range Status   Adenovirus NOT DETECTED NOT DETECTED Final   Coronavirus 229E NOT DETECTED NOT DETECTED Final    Comment: (NOTE) The Coronavirus on the Respiratory Panel, DOES NOT test for the novel  Coronavirus (2019 nCoV)    Coronavirus HKU1 NOT DETECTED NOT DETECTED Final   Coronavirus NL63 NOT DETECTED NOT DETECTED Final   Coronavirus OC43 NOT DETECTED NOT DETECTED Final   Metapneumovirus NOT DETECTED NOT DETECTED Final   Rhinovirus / Enterovirus NOT DETECTED NOT DETECTED Final   Influenza A NOT DETECTED NOT DETECTED Final   Influenza B NOT DETECTED NOT DETECTED Final   Parainfluenza Virus 1 NOT DETECTED NOT DETECTED Final   Parainfluenza Virus 2 NOT DETECTED NOT DETECTED Final   Parainfluenza Virus 3 NOT DETECTED NOT DETECTED Final   Parainfluenza Virus 4 NOT DETECTED NOT DETECTED Final   Respiratory Syncytial Virus NOT DETECTED NOT DETECTED Final   Bordetella pertussis NOT DETECTED NOT DETECTED Final   Bordetella Parapertussis NOT DETECTED NOT DETECTED Final   Chlamydophila pneumoniae NOT DETECTED NOT DETECTED Final   Mycoplasma pneumoniae NOT DETECTED NOT DETECTED Final    Comment: Performed at [facility], [place] [place], [place] [address]   SARS Coronavirus 2 by RT PCR (hospital order, performed in [facility] hospital lab) *cepheid single result test* Anterior Nasal Swab     Status: None  Collection Time: 03/25/22 10:15 AM   Specimen: Anterior Nasal Swab  Result Value Ref Range Status   SARS Coronavirus 2 by RT PCR NEGATIVE NEGATIVE Final    Comment: (NOTE) SARS-CoV-2 target nucleic acids are NOT DETECTED.  The SARS-CoV-2 RNA is generally detectable in upper and lower respiratory specimens during the acute phase of infection. The lowest concentration of SARS-CoV-2 viral copies this assay can detect is 250 copies / mL. A negative result does not preclude SARS-CoV-2 infection and should not be used as the sole basis for treatment or other patient management decisions.  A negative result may occur with improper specimen collection / handling, submission of specimen other than nasopharyngeal swab, presence of viral mutation(s) within the areas targeted by this assay, and inadequate number of viral copies (<250 copies / mL). A negative result must be combined with clinical observations, patient history, and epidemiological information.  Fact Sheet for Patients:   https://www.patel.info/  Fact Sheet for Healthcare Providers: https://hall.com/  This test is not yet approved or  cleared by the [place] FDA and has been authorized for detection and/or diagnosis of SARS-CoV-2 by FDA under an Emergency Use Authorization (EUA).  This EUA will remain in effect (meaning this test can be used) for the duration of the COVID-19 declaration under Section 564(b)(1) of the Act, 21 U.S.C. section 360bbb-3(b)(1), unless the authorization is terminated or revoked sooner.  Performed at [facility], [address], [place], [place] [address]          Radiology Studies: DG Lumbar Spine Complete  Result Date: 03/26/2022 CLINICAL DATA:  Mid to low back pain EXAM: LUMBAR  SPINE - COMPLETE 4+ VIEW COMPARISON:  01/26/2021 FINDINGS: No evidence of regional fracture or focal bone lesion. Mild lower lumbar degenerative disc disease and degenerative facet disease which could be symptomatic. No apparent change. Sacroiliac joints appear normal. IMPRESSION: Lower lumbar degenerative disc disease and degenerative facet disease, similar to radiography of June 2022, which could possibly be symptomatic. Electronically Signed   By: Nelson Chimes M.D.   On: 03/26/2022 15:39   DG Thoracic Spine 2 View  Result Date: 03/26/2022 CLINICAL DATA:  Thoracic region back pain EXAM: THORACIC SPINE 2 VIEWS COMPARISON:  04/18/2018 FINDINGS: No significant malalignment. No evidence of regional fracture. Ordinary mild thoracic degenerative changes. Posteromedial ribs appear negative. IMPRESSION: Negative radiographs. Electronically Signed   By: Nelson Chimes M.D.   On: 03/26/2022 15:38        Scheduled Meds:  buprenorphine-naloxone  1 tablet Sublingual Daily   enoxaparin (LOVENOX) injection  0.5 mg/kg Subcutaneous Q24H   insulin aspart  0-15 Units Subcutaneous TID WC   insulin aspart  0-5 Units Subcutaneous QHS   lisinopril  30 mg Oral Daily   methocarbamol  750 mg Oral QID   pantoprazole  40 mg Oral Daily   PARoxetine  10 mg Oral Daily   sodium chloride flush  3 mL Intravenous Q12H   Continuous Infusions:  cefTRIAXone (ROCEPHIN)  IV 2 g (03/27/22 1144)     LOS: 2 days    Sidney Ace, MD [facility]   If 7PM-7AM, please contact night-coverage  03/27/2022, 2:02 PM

## 2022-03-27 NOTE — Plan of Care (Signed)

## 2022-03-28 ENCOUNTER — Encounter: Payer: Self-pay | Admitting: Internal Medicine

## 2022-03-28 DIAGNOSIS — E162 Hypoglycemia, unspecified: Secondary | ICD-10-CM | POA: Diagnosis not present

## 2022-03-28 DIAGNOSIS — A419 Sepsis, unspecified organism: Secondary | ICD-10-CM | POA: Diagnosis not present

## 2022-03-28 DIAGNOSIS — B961 Klebsiella pneumoniae [K. pneumoniae] as the cause of diseases classified elsewhere: Secondary | ICD-10-CM | POA: Diagnosis present

## 2022-03-28 DIAGNOSIS — R55 Syncope and collapse: Secondary | ICD-10-CM | POA: Diagnosis not present

## 2022-03-28 DIAGNOSIS — R7881 Bacteremia: Secondary | ICD-10-CM | POA: Diagnosis not present

## 2022-03-28 LAB — GLUCOSE, CAPILLARY
Glucose-Capillary: 68 mg/dL — ABNORMAL LOW (ref 70–99)
Glucose-Capillary: 89 mg/dL (ref 70–99)
Glucose-Capillary: 81 mg/dL (ref 70–99)
Glucose-Capillary: 134 mg/dL — ABNORMAL HIGH (ref 70–99)
Glucose-Capillary: 108 mg/dL — ABNORMAL HIGH (ref 70–99)
Glucose-Capillary: 115 mg/dL — ABNORMAL HIGH (ref 70–99)

## 2022-03-28 MED ORDER — FUROSEMIDE 40 MG PO TABS
80.0000 mg | ORAL_TABLET | Freq: Every day | ORAL | Status: DC
  Administered 2022-03-28 – 2022-03-29 (×2): 80 mg via ORAL
  Filled 2022-03-28 (×2): qty 2

## 2022-03-28 NOTE — Progress Notes (Unsigned)
Entered wrong. °

## 2022-03-28 NOTE — Progress Notes (Signed)
Occupational Therapy Treatment Patient Details Name: [name] MRN: [id number] DOB: 1951-01-06 Today's Date: 03/28/2022   History of present illness Pt is a 71 y/o M admitted on 03/24/22 after presenting with c/o episode of LOC/syncope & fever. Of note, pt was in the ED 3 days prior for syncope. Pt also found to have sepsis 2/2 klebsiella bacteremia. PMH: HTN, HLD, thrombocytopenia, transaminitis, Hep C, anxiety, depression, DM, anemia, lymphadema, obesity, COPD   OT comments  Chart reviewed, pt greeted in chair agreeable to OT tx session. Tx session targeted improving bed mobility for decreased pain, LB dressing. Improvements noted in LB dressing. Pt amb to bathroom with RW (endorses fear of falling and LBP) with supervision, vcs for use. Standing grooming tasks completed with supervision. LB dressing with SET UP. Pt is left as received, NAD, all needs met. Discharge recommendation has been updated. OT will follow acutely.    Recommendations for follow up therapy are one component of a multi-disciplinary discharge planning process, led by the attending physician.  Recommendations may be updated based on patient status, additional functional criteria and insurance authorization.    Follow Up Recommendations  No OT follow up    Assistance Recommended at Discharge Intermittent Supervision/Assistance  Patient can return home with the following  A little help with walking and/or transfers;A little help with bathing/dressing/bathroom;Assist for transportation;Help with stairs or ramp for entrance   Equipment Recommendations  Other (comment) Management consultant)    Recommendations for Other Services      Precautions / Restrictions Precautions Precautions: Fall Restrictions Weight Bearing Restrictions: No       Mobility Bed Mobility Overal bed mobility: Needs Assistance Bed Mobility: Sidelying to Sit, Sit to Sidelying   Sidelying to sit: Supervision, HOB elevated     Sit to sidelying:  Supervision, HOB elevated General bed mobility comments: verbal cues for log roll technique to for decreased pain, pt with good carry over after 2 attempts    Transfers Overall transfer level: Needs assistance Equipment used: Rolling walker (2 wheels) Transfers: Sit to/from Stand Sit to Stand: Supervision                 Balance                                           ADL either performed or assessed with clinical judgement   ADL Overall ADL's : Needs assistance/impaired                                       General ADL Comments: LB dressing with SET UP, grooming at sink level with supervision; amb to bathroom with RW with supervision    Extremity/Trunk Assessment              Vision       Perception     Praxis      Cognition Arousal/Alertness: Awake/alert Behavior During Therapy: WFL for tasks assessed/performed Overall Cognitive Status: Within Functional Limits for tasks assessed                                          Exercises      Shoulder Instructions       General  Comments      Pertinent Vitals/ Pain       Pain Assessment Pain Assessment: Faces Faces Pain Scale: Hurts little more Pain Location: neck and lower back Pain Descriptors / Indicators: Aching, Grimacing, Guarding Pain Intervention(s): Limited activity within patient's tolerance, Monitored during session, Repositioned  Home Living                                          Prior Functioning/Environment              Frequency  Min 2X/week        Progress Toward Goals  OT Goals(current goals can now be found in the care plan section)  Progress towards OT goals: Progressing toward goals     Plan Discharge plan needs to be updated    Co-evaluation                 AM-PAC OT "6 Clicks" Daily Activity     Outcome Measure   Help from another person eating meals?: None Help from another  person taking care of personal grooming?: None Help from another person toileting, which includes using toliet, bedpan, or urinal?: A Little Help from another person bathing (including washing, rinsing, drying)?: A Little Help from another person to put on and taking off regular upper body clothing?: None Help from another person to put on and taking off regular lower body clothing?: A Little 6 Click Score: 21    End of Session Equipment Utilized During Treatment: Rolling walker (2 wheels)  OT Visit Diagnosis: Other abnormalities of gait and mobility (R26.89);Muscle weakness (generalized) (M62.81)   Activity Tolerance Patient limited by pain   Patient Left in bed;with call bell/phone within reach;with bed alarm set   Nurse Communication Mobility status        Time: 1053-1106 OT Time Calculation (min): 13 min  Charges: OT General Charges $OT Visit: 1 Visit OT Treatments $Self Care/Home Management : 8-22 mins  Shanon Payor, OTD OTR/L  03/28/22, 12:41 PM

## 2022-03-28 NOTE — Plan of Care (Signed)
  Problem: Health Behavior/Discharge Planning: Goal: Ability to manage health-related needs will improve Outcome: Progressing   Problem: Clinical Measurements: Goal: Ability to maintain clinical measurements within normal limits will improve Outcome: Progressing Goal: Will remain free from infection Outcome: Progressing Goal: Diagnostic test results will improve Outcome: Progressing Goal: Respiratory complications will improve Outcome: Progressing Goal: Cardiovascular complication will be avoided Outcome: Progressing   Problem: Activity: Goal: Risk for activity intolerance will decrease Outcome: Progressing   

## 2022-03-28 NOTE — Progress Notes (Signed)
Physical Therapy Treatment Patient Details Name: [name] MRN: [id number] DOB: 04/22/1951 Today's Date: 03/28/2022   History of Present Illness Pt is a 71 y/o M admitted on 03/24/22 after presenting with c/o episode of LOC/syncope & fever. Of note, pt was in the ED 3 days prior for syncope. Pt also found to have sepsis 2/2 klebsiella bacteremia. PMH: HTN, HLD, thrombocytopenia, transaminitis, Hep C, anxiety, depression, DM, anemia, lymphadema, obesity, COPD    PT Comments    Pt pleasant but not initially interested in doing a lot, however with minimal cuing he was willing to try prolonged bout of ambulation (with cane, refused RW despite reports of back pain, unsteadiness and foot wound).  Pt on 2L O2 on arrival with SpO2 at 98%, sats remained in the 90s t/o circuambulation of the nurses' station on room air.  No overt LOBs though he did have occasional stagger steps.   Recommendations for follow up therapy are one component of a multi-disciplinary discharge planning process, led by the attending physician.  Recommendations may be updated based on patient status, additional functional criteria and insurance authorization.  Follow Up Recommendations  Home health PT     Assistance Recommended at Discharge Intermittent Supervision/Assistance  Patient can return home with the following A little help with walking and/or transfers;A little help with bathing/dressing/bathroom;Assistance with cooking/housework;Assist for transportation;Help with stairs or ramp for entrance   Equipment Recommendations  Rolling walker (2 wheels)    Recommendations for Other Services       Precautions / Restrictions Precautions Precautions: Fall Restrictions Weight Bearing Restrictions: No     Mobility  Bed Mobility Overal bed mobility: Modified Independent Bed Mobility: Supine to Sit   Sidelying to sit: Supervision       General bed mobility comments: With rails and extra time pt was able to get  to sitting EOB w/o assist    Transfers Overall transfer level: Needs assistance Equipment used: Rolling walker (2 wheels) Transfers: Sit to/from Stand Sit to Stand: Supervision                Ambulation/Gait Ambulation/Gait assistance: Supervision, Min guard Gait Distance (Feet): 200 Feet Assistive device: Straight cane         General Gait Details: Pt with minimal unsteadiness t/o the effort, but no overt LOBs or overt safety issues.  Pt on room air t/o the effort with SpO2 remaining in the 90s t/o the effort.   Stairs             Wheelchair Mobility    Modified Rankin (Stroke Patients Only)       Balance Overall balance assessment: Needs assistance, History of Falls Sitting-balance support: Feet supported Sitting balance-Leahy Scale: Good     Standing balance support: During functional activity, Single extremity supported Standing balance-Leahy Scale: Fair                              Cognition Arousal/Alertness: Awake/alert Behavior During Therapy: WFL for tasks assessed/performed Overall Cognitive Status: Within Functional Limits for tasks assessed                                          Exercises      General Comments        Pertinent Vitals/Pain Pain Assessment Pain Assessment: Faces Faces Pain Scale: Hurts little more Pain  Location: low back    Home Living                          Prior Function            PT Goals (current goals can now be found in the care plan section) Progress towards PT goals: Progressing toward goals    Frequency    Min 2X/week      PT Plan Current plan remains appropriate    Co-evaluation              AM-PAC PT "6 Clicks" Mobility   Outcome Measure  Help needed turning from your back to your side while in a flat bed without using bedrails?: None Help needed moving from lying on your back to sitting on the side of a flat bed without using  bedrails?: None Help needed moving to and from a bed to a chair (including a wheelchair)?: A Little Help needed standing up from a chair using your arms (e.g., wheelchair or bedside chair)?: None Help needed to walk in hospital room?: A Little Help needed climbing 3-5 steps with a railing? : A Little 6 Click Score: 21    End of Session Equipment Utilized During Treatment: Gait belt Activity Tolerance: Patient limited by pain Patient left: in chair;with call bell/phone within reach Nurse Communication: Mobility status PT Visit Diagnosis: Unsteadiness on feet (R26.81);History of falling (Z91.81);Muscle weakness (generalized) (M62.81);Pain Pain - part of body:  (lumbago)     Time: 3007-6226 PT Time Calculation (min) (ACUTE ONLY): 25 min  Charges:  $Gait Training: 8-22 mins $Therapeutic Activity: 8-22 mins                     Kreg Shropshire, DPT 03/28/2022, 1:28 PM

## 2022-03-28 NOTE — Care Management Important Message (Signed)
Important Message  Patient Details  Name: [name] MRN: [id number] Date of Birth: 02-Jan-1951   Medicare Important Message Given:  N/A - LOS <3 / Initial given by admissions     Juliann Pulse A Arletta Lumadue 03/28/2022, 10:00 AM

## 2022-03-28 NOTE — Inpatient Diabetes Management (Signed)
Inpatient Diabetes Program Recommendations  AACE/ADA: New Consensus Statement on Inpatient Glycemic Control  Target Ranges:  Prepandial:   less than 140 mg/dL      Peak postprandial:   less than 180 mg/dL (1-2 hours)      Critically ill patients:  140 - 180 mg/dL     Latest Reference Range & Units 03/28/22 05:04 03/28/22 06:02 03/28/22 07:44  Glucose-Capillary 70 - 99 mg/dL 68 (L) 89 81    Latest Reference Range & Units 03/27/22 11:36 03/27/22 16:13 03/27/22 20:54  Glucose-Capillary 70 - 99 mg/dL 135 (H) 125 (H) 107 (H)   Review of Glycemic Control  Diabetes history: DM2 Outpatient Diabetes medications: Metformin 500 mg daily, Jardiance 10 mg daily Current orders for Inpatient glycemic control: Novolog 0-15 units TID with meals, Novolog 0-5 units QHS  Inpatient Diabetes Program Recommendations:    Insulin: Please consider decreasing Novolog correction to 0-9 units TID with meals.  Thanks, Barnie Alderman, RN, MSN, [place] [facility] Coordinator Inpatient Diabetes Program [phone] (Team Pager from 8am to [place])

## 2022-03-28 NOTE — Plan of Care (Signed)
  Problem: Coping: Goal: Level of anxiety will decrease Outcome: Progressing   Problem: Elimination: Goal: Will not experience complications related to bowel motility Outcome: Progressing   Problem: Pain Managment: Goal: General experience of comfort will improve Outcome: Progressing   

## 2022-03-28 NOTE — TOC Progression Note (Signed)
Transition of Care [facility]) - Progression Note    Patient Details  Name: [name] MRN: [id number] Date of Birth: 09-21-1950  Transition of Care [facility]) CM/SW [place], RN Phone Number: 03/28/2022, 11:45 AM  Clinical Narrative:    I met with the patient and explained to him I do not have any ALF facility making an offer, I explained he will need to return home,  He stated that he has no home, I explained that the land lord is not legally able to just say he can not return home without eviction, he stated that they did.  I explained that I would give him homeless shelter resources, He stated he is unable to go to a homeless shelter due to previous charges he had that he was found guilty of, When asked what those charges were he said they were not his.  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

## 2022-03-28 NOTE — Progress Notes (Addendum)
The    Progress Note    [name]  YQI:[id number] DOB: 05/04/1951  DOA: 03/24/2022 PCP: Denton Lank, MD      Brief Narrative:    [name] and are as summarized below:  [name] is a 71 y.o. male with Drew history significant of hypertension, hyperlipidemia, thrombocytopenia, transaminitis, hepatitis C, anxiety, depression, diabetes, anemia, lymphedema, obesity, COPD, who presented to the hospital for syncope/loss of consciousness and fever.  Reportedly, he was found by a bystander.  He was found in the morning prior to the fall.  He had gone for a walk later in the day and remembers tripping over a curb.  He has a complaint of shortness of breath and increasing lower extremity swelling since he ran out of his diuretic.  He had been to the emergency room about 3 days prior to admission for syncope.  He was admitted to the hospital for syncope and fever.  Work-up revealed sepsis secondary to Klebsiella bacteremia.     Assessment/Plan:   Principal Problem:   Syncope and collapse Active Problems:   Adjustment disorder with mixed anxiety and depressed mood   Anemia   HTN (hypertension)   Uncontrolled type 2 diabetes mellitus with hyperglycemia, without long-term current use of insulin (HCC)   Obesity, Class III, BMI 40-49.9 (morbid obesity) (HCC)   COPD (chronic obstructive pulmonary disease) (HCC)   Pure hypercholesterolemia   Hypokalemia   Fever   Sepsis ([place])   Bacteremia due to Klebsiella pneumoniae   Hypoglycemia    Body mass index is 46.15 kg/m.  (Morbid obesity)   Sepsis secondary to Klebsiella bacteremia, fever on admission: Continue IV ceftriaxone.  Plan to discharge him tomorrow on oral antibiotics.  S/p syncope: No arrhythmias on telemetry.  2D echo is unremarkable.  Hypoglycemia: Glucose dropped to 68 early hours of the morning.  Glucose dropped to 65 yesterday morning as well.  Discontinue NovoLog sliding scale.  Chronic  diastolic CHF ruled in, peripheral edema: BNP was 460.  Restart home Lasix.  2D echo on 03/25/2022 showed EF estimated at 60 to 65%, normal LV diastolic parameters.  Hypokalemia: Improved  Other comorbidities include hypertension, type II DM, depression, anxiety, COPD, opioid use disorder, anemia of chronic disease, history of hepatitis C, homelessness  Disposition: Unfortunately, case manager was unable to find ALF for the patient.  Patient has been notified that he cannot go to any facility at this time and so he will be discharged tomorrow.  He was appreciative of the fact that he has been given a day's notice so that he can plan ahead.    Diet Order             Diet Heart Room service appropriate? Yes; Fluid consistency: Thin  Diet effective now                            Consultants: None  Procedures: None    Medications:    buprenorphine-naloxone  1 tablet Sublingual Daily   enoxaparin (LOVENOX) injection  0.5 mg/kg Subcutaneous Q24H   furosemide  80 mg Oral Daily   lisinopril  30 mg Oral Daily   methocarbamol  750 mg Oral QID   pantoprazole  40 mg Oral Daily   PARoxetine  10 mg Oral Daily   sodium chloride flush  3 mL Intravenous Q12H   Continuous Infusions:  cefTRIAXone (ROCEPHIN)  IV Stopped (03/28/22 1030)     Anti-infectives (From  admission, onward)    Start     Dose/Rate Route Frequency Ordered Stop   03/26/22 1200  cefTRIAXone (ROCEPHIN) 2 g in sodium chloride 0.9 % 100 mL IVPB        2 g 200 mL/hr over 30 Minutes Intravenous Every 24 hours 03/26/22 0931     03/25/22 0900  ceFEPIme (MAXIPIME) 2 g in sodium chloride 0.9 % 100 mL IVPB  Status:  Discontinued        2 g 200 mL/hr over 30 Minutes Intravenous Every 8 hours 03/25/22 0809 03/26/22 0931   03/24/22 2100  vancomycin (VANCOCIN) IVPB 1000 mg/200 mL premix        1,000 mg 200 mL/hr over 60 Minutes Intravenous  Once 03/24/22 2050 03/25/22 0045   03/24/22 2100  ceFEPIme (MAXIPIME) 2 g  in sodium chloride 0.9 % 100 mL IVPB        2 g 200 mL/hr over 30 Minutes Intravenous  Once 03/24/22 2050 03/24/22 2127   03/24/22 2100  metroNIDAZOLE (FLAGYL) IVPB 500 mg        500 mg 100 mL/hr over 60 Minutes Intravenous  Once 03/24/22 2050 03/24/22 2201              Family Communication/Anticipated D/C date and plan/Code Status   DVT prophylaxis:      Code Status: Full Code  Family Communication: None Disposition Plan: Plan to discharge home tomorrow   Status is: Inpatient Remains inpatient appropriate because: Klebsiella bacteremia on IV antibiotics       Subjective:   Interval events noted.  He complains of increasing leg swelling since stopping his Lasix prior to admission.  No shortness of breath or chest pain.  Objective:    Vitals:   03/28/22 0743 03/28/22 0939 03/28/22 1129 03/28/22 1504  BP: 137/71  (!) 120/54 123/63  Pulse: (!) 49 (!) 57 (!) 51 (!) 52  Resp: '17  16 16  '$ Temp: 98 F (36.7 C)  98.1 F (36.7 C) 98.1 F (36.7 C)  TempSrc:      SpO2: 98%  98% 98%  Weight:      Height:       No data found.   Intake/Output Summary (Last 24 hours) at 03/28/2022 1720 Last data filed at 03/28/2022 1419 Gross per 24 hour  Intake 440 ml  Output 1100 ml  Net -660 ml   Filed Weights   03/25/22 0500 03/26/22 0500 03/28/22 0500  Weight: 127.6 kg 128.9 kg 129.7 kg    Exam:  GEN: NAD SKIN: Warm and dry.  Multiple chronic wounds and scabs on bilateral legs EYES: No pallor or icterus ENT: MMM CV: RRR PULM: CTA B ABD: soft, obese, NT, +BS CNS: AAO x 3, non focal EXT: Significant bilateral leg edema, no erythema or tenderness        Data [name]:   I have personally [name] following labs and imaging studies:  Labs: Labs show the following:   Basic Metabolic Panel: Recent Labs  Lab 03/24/22 1927 03/24/22 2030 03/25/22 0521  NA 136  --  137  K 3.4*  --  4.0  CL 105  --  108  CO2 24  --  22  GLUCOSE 125*  --  109*  BUN 11   --  12  CREATININE 1.22  --  1.15  CALCIUM 9.2  --  9.0  MG  --  1.6*  --    GFR Estimated Creatinine Clearance: 74.1 mL/min (by C-G formula based on SCr  of 1.15 mg/dL). Liver Function Tests: Recent Labs  Lab 03/25/22 0521  AST 62*  ALT 42  ALKPHOS 54  BILITOT 2.5*  PROT 5.7*  ALBUMIN 2.4*   No results for input(s): "LIPASE", "AMYLASE" in the last 168 hours. No results for input(s): "AMMONIA" in the last 168 hours. Coagulation profile No results for input(s): "INR", "PROTIME" in the last 168 hours.  CBC: Recent Labs  Lab 03/24/22 1927 03/25/22 0521  WBC 4.9 7.1  HGB 10.9* 10.1*  HCT 33.4* 30.5*  MCV 92.3 91.6  PLT 117* 89*   Cardiac Enzymes: Recent Labs  Lab 03/24/22 1957  CKTOTAL 614*   BNP (last 3 results) No results for input(s): "PROBNP" in the last 8760 hours. CBG: Recent Labs  Lab 03/28/22 0504 03/28/22 0602 03/28/22 0744 03/28/22 1130 03/28/22 1632  GLUCAP 68* 89 81 134* 108*   D-Dimer: No results for input(s): "DDIMER" in the last 72 hours. Hgb A1c: Recent Labs    03/26/22 0445  HGBA1C 5.4   Lipid Profile: No results for input(s): "CHOL", "HDL", "LDLCALC", "TRIG", "CHOLHDL", "LDLDIRECT" in the last 72 hours. Thyroid function studies: No results for input(s): "TSH", "T4TOTAL", "T3FREE", "THYROIDAB" in the last 72 hours.  Invalid input(s): "FREET3" Anemia work up: No results for input(s): "VITAMINB12", "FOLATE", "FERRITIN", "TIBC", "IRON", "RETICCTPCT" in the last 72 hours. Sepsis Labs: Recent Labs  Lab 03/24/22 1927 03/24/22 1929 03/24/22 2030 03/24/22 2225 03/25/22 0521 03/26/22 0445  PROCALCITON  --   --  0.11  --  1.52 1.06  WBC 4.9  --   --   --  7.1  --   LATICACIDVEN  --  2.5*  --  1.8  --   --     Microbiology Recent Results (from the past 240 hour(s))  Blood culture (routine x 2)     Status: Abnormal   Collection Time: 03/24/22  7:34 PM   Specimen: BLOOD  Result Value Ref Range Status   Specimen Description   Final     BLOOD RIGHT ANTECUBITAL Performed at [facility], [address], [place], [place] [address]    Special Requests   Final    BOTTLES DRAWN AEROBIC AND ANAEROBIC Blood Culture results may not be optimal due to an excessive volume of blood received in culture bottles Performed at [facility], [address] [place], [place] [address]    Culture  Setup Time   Final    Organism ID to follow Victor AND ANAEROBIC BOTTLES CRITICAL RESULT CALLED TO, READ BACK BY AND VERIFIED WITH: WILL ANDERSON '@0835'$  03/25/22 MJU Performed at [facility], [address] [place], [place] [address]    Culture KLEBSIELLA PNEUMONIAE (A)  Final   Report Status 03/27/2022 FINAL  Final   Organism ID, Bacteria KLEBSIELLA PNEUMONIAE  Final      Susceptibility   Klebsiella pneumoniae - MIC*    AMPICILLIN RESISTANT Resistant     CEFAZOLIN <=4 SENSITIVE Sensitive     CEFEPIME <=0.12 SENSITIVE Sensitive     CEFTAZIDIME <=1 SENSITIVE Sensitive     CEFTRIAXONE <=0.25 SENSITIVE Sensitive     CIPROFLOXACIN <=0.25 SENSITIVE Sensitive     GENTAMICIN <=1 SENSITIVE Sensitive     IMIPENEM <=0.25 SENSITIVE Sensitive     TRIMETH/SULFA <=20 SENSITIVE Sensitive     AMPICILLIN/SULBACTAM <=2 SENSITIVE Sensitive     PIP/TAZO <=4 SENSITIVE Sensitive     * KLEBSIELLA PNEUMONIAE  Blood Culture ID Panel (Reflexed)  Status: Abnormal   Collection Time: 03/24/22  7:34 PM  Result Value Ref Range Status   Enterococcus faecalis NOT DETECTED NOT DETECTED Final   Enterococcus Faecium NOT DETECTED NOT DETECTED Final   Listeria monocytogenes NOT DETECTED NOT DETECTED Final   Staphylococcus species NOT DETECTED NOT DETECTED Final   Staphylococcus aureus (BCID) NOT DETECTED NOT DETECTED Final   Staphylococcus epidermidis NOT DETECTED NOT DETECTED Final   Staphylococcus lugdunensis NOT DETECTED NOT DETECTED Final   Streptococcus species NOT DETECTED NOT DETECTED Final    Streptococcus agalactiae NOT DETECTED NOT DETECTED Final   Streptococcus pneumoniae NOT DETECTED NOT DETECTED Final   Streptococcus pyogenes NOT DETECTED NOT DETECTED Final   A.calcoaceticus-baumannii NOT DETECTED NOT DETECTED Final   Bacteroides fragilis NOT DETECTED NOT DETECTED Final   Enterobacterales DETECTED (A) NOT DETECTED Final    Comment: Enterobacterales represent a large order of gram negative bacteria, not a single organism. CRITICAL RESULT CALLED TO, READ BACK BY AND VERIFIED WITH: WILL ANDERSON '@0835'$  03/25/22 MJU    Enterobacter cloacae complex NOT DETECTED NOT DETECTED Final   Escherichia coli NOT DETECTED NOT DETECTED Final   Klebsiella aerogenes NOT DETECTED NOT DETECTED Final   Klebsiella oxytoca NOT DETECTED NOT DETECTED Final   Klebsiella pneumoniae DETECTED (A) NOT DETECTED Final    Comment: CRITICAL RESULT CALLED TO, READ BACK BY AND VERIFIED WITH: WILL ANDERSON '@0835'$  03/25/22 MJU    Proteus species NOT DETECTED NOT DETECTED Final   Salmonella species NOT DETECTED NOT DETECTED Final   Serratia marcescens NOT DETECTED NOT DETECTED Final   Haemophilus influenzae NOT DETECTED NOT DETECTED Final   Neisseria meningitidis NOT DETECTED NOT DETECTED Final   Pseudomonas aeruginosa NOT DETECTED NOT DETECTED Final   Stenotrophomonas maltophilia NOT DETECTED NOT DETECTED Final   Candida albicans NOT DETECTED NOT DETECTED Final   Candida auris NOT DETECTED NOT DETECTED Final   Candida glabrata NOT DETECTED NOT DETECTED Final   Candida krusei NOT DETECTED NOT DETECTED Final   Candida parapsilosis NOT DETECTED NOT DETECTED Final   Candida tropicalis NOT DETECTED NOT DETECTED Final   Cryptococcus neoformans/gattii NOT DETECTED NOT DETECTED Final   CTX-M ESBL NOT DETECTED NOT DETECTED Final   Carbapenem resistance IMP NOT DETECTED NOT DETECTED Final   Carbapenem resistance KPC NOT DETECTED NOT DETECTED Final   Carbapenem resistance NDM NOT DETECTED NOT DETECTED Final    Carbapenem resist OXA 48 LIKE NOT DETECTED NOT DETECTED Final   Carbapenem resistance VIM NOT DETECTED NOT DETECTED Final    Comment: Performed at [facility], [address] [place], [place] [address]  Blood culture (routine x 2)     Status: Abnormal   Collection Time: 03/24/22  7:39 PM   Specimen: BLOOD  Result Value Ref Range Status   Specimen Description   Final    BLOOD LEFT ANTECUBITAL Performed at [facility], [address], [place], [place] [address]    Special Requests   Final    BOTTLES DRAWN AEROBIC AND ANAEROBIC Blood Culture adequate volume Performed at [facility], [address] [place], [place] [address]    Culture  Setup Time   Final    AEROBIC BOTTLE ONLY GRAM NEGATIVE RODS CRITICAL RESULT CALLED TO, READ BACK BY AND VERIFIED WITH: WILL ANDERSON '@0835'$  03/25/22 MJU Performed at [facility], [address] [place] [place], [place] [address]    Culture (A)  Final    KLEBSIELLA PNEUMONIAE SUSCEPTIBILITIES PERFORMED ON PREVIOUS CULTURE WITHIN THE LAST 5 DAYS.  Performed at [facility], [place] [address], [place], [place] [address]    Report Status 03/27/2022 FINAL  Final  Respiratory (~20 pathogens) panel by PCR     Status: None   Collection Time: 03/24/22 10:18 PM   Specimen: Nasopharyngeal Swab; Respiratory  Result Value Ref Range Status   Adenovirus NOT DETECTED NOT DETECTED Final   Coronavirus 229E NOT DETECTED NOT DETECTED Final    Comment: (NOTE) The Coronavirus on the Respiratory Panel, DOES NOT test for the novel  Coronavirus (2019 nCoV)    Coronavirus HKU1 NOT DETECTED NOT DETECTED Final   Coronavirus NL63 NOT DETECTED NOT DETECTED Final   Coronavirus OC43 NOT DETECTED NOT DETECTED Final   Metapneumovirus NOT DETECTED NOT DETECTED Final   Rhinovirus / Enterovirus NOT DETECTED NOT DETECTED Final   Influenza A NOT DETECTED NOT DETECTED Final   Influenza B NOT DETECTED NOT DETECTED Final   Parainfluenza Virus 1  NOT DETECTED NOT DETECTED Final   Parainfluenza Virus 2 NOT DETECTED NOT DETECTED Final   Parainfluenza Virus 3 NOT DETECTED NOT DETECTED Final   Parainfluenza Virus 4 NOT DETECTED NOT DETECTED Final   Respiratory Syncytial Virus NOT DETECTED NOT DETECTED Final   Bordetella pertussis NOT DETECTED NOT DETECTED Final   Bordetella Parapertussis NOT DETECTED NOT DETECTED Final   Chlamydophila pneumoniae NOT DETECTED NOT DETECTED Final   Mycoplasma pneumoniae NOT DETECTED NOT DETECTED Final    Comment: Performed at [facility], [place]. [address] [place], [place] [address]  SARS Coronavirus 2 by RT PCR (hospital order, performed in [facility] hospital lab) *cepheid single result test* Anterior Nasal Swab     Status: None   Collection Time: 03/25/22 10:15 AM   Specimen: Anterior Nasal Swab  Result Value Ref Range Status   SARS Coronavirus 2 by RT PCR NEGATIVE NEGATIVE Final    Comment: (NOTE) SARS-CoV-2 target nucleic acids are NOT DETECTED.  The SARS-CoV-2 RNA is generally detectable in upper and lower respiratory specimens during the acute phase of infection. The lowest concentration of SARS-CoV-2 viral copies this assay can detect is 250 copies / mL. A negative result does not preclude SARS-CoV-2 infection and should not be used as the sole basis for treatment or other patient management decisions.  A negative result may occur with improper specimen collection / handling, submission of specimen other than nasopharyngeal swab, presence of viral mutation(s) within the areas targeted by this assay, and inadequate number of viral copies (<250 copies / mL). A negative result must be combined with clinical observations, patient history, and epidemiological information.  Fact Sheet for Patients:   https://www.patel.info/  Fact Sheet for Healthcare Providers: https://hall.com/  This test is not yet approved or  cleared by the [place] FDA  and has been authorized for detection and/or diagnosis of SARS-CoV-2 by FDA under an Emergency Use Authorization (EUA).  This EUA will remain in effect (meaning this test can be used) for the duration of the COVID-19 declaration under Section 564(b)(1) of the Act, 21 U.S.C. section 360bbb-3(b)(1), unless the authorization is terminated or revoked sooner.  Performed at [facility], [address] [place],  [address]     Procedures and diagnostic studies:  No results found.             LOS: 3 days   Kirsten Mckone  [facility]   Pager on [email or web address]. If 7PM-7AM, please contact night-coverage at [email or web address]     03/28/2022, 5:20 PM

## 2022-03-29 DIAGNOSIS — B961 Klebsiella pneumoniae [K. pneumoniae] as the cause of diseases classified elsewhere: Secondary | ICD-10-CM | POA: Diagnosis not present

## 2022-03-29 DIAGNOSIS — A419 Sepsis, unspecified organism: Secondary | ICD-10-CM | POA: Diagnosis not present

## 2022-03-29 DIAGNOSIS — R55 Syncope and collapse: Secondary | ICD-10-CM | POA: Diagnosis not present

## 2022-03-29 DIAGNOSIS — R7881 Bacteremia: Secondary | ICD-10-CM | POA: Diagnosis not present

## 2022-03-29 LAB — URINALYSIS, ROUTINE W REFLEX MICROSCOPIC
Bilirubin Urine: NEGATIVE
Glucose, UA: NEGATIVE mg/dL
Hgb urine dipstick: NEGATIVE
Ketones, ur: NEGATIVE mg/dL
Leukocytes,Ua: NEGATIVE
Nitrite: NEGATIVE
Protein, ur: NEGATIVE mg/dL
Specific Gravity, Urine: 1.004 — ABNORMAL LOW (ref 1.005–1.030)
pH: 5 (ref 5.0–8.0)

## 2022-03-29 LAB — GLUCOSE, CAPILLARY
Glucose-Capillary: 90 mg/dL (ref 70–99)
Glucose-Capillary: 79 mg/dL (ref 70–99)

## 2022-03-29 MED ORDER — LEVOFLOXACIN 750 MG PO TABS: 750.0000 mg | ORAL_TABLET | Freq: Every day | ORAL | 0 refills | Status: AC

## 2022-03-29 MED ORDER — JARDIANCE 10 MG PO TABS: 10.0000 mg | ORAL_TABLET | Freq: Every day | ORAL | 0 refills | Status: DC

## 2022-03-29 MED ORDER — LEVOFLOXACIN 500 MG PO TABS
750.0000 mg | ORAL_TABLET | Freq: Once | ORAL | Status: DC
Start: 2022-03-29 — End: 2022-03-29

## 2022-03-29 NOTE — Discharge Summary (Signed)
Physician Discharge Summary   Patient: [name] MRN: [id number] DOB: [birth year]  Admit date:     03/24/2022  Discharge date: 03/29/2022  Discharge Physician: Jennye Boroughs   PCP: Denton Lank, MD   Recommendations at discharge:   Follow-up with PCP in 1 week  Discharge Diagnoses: Principal Problem:   Syncope and collapse Active Problems:   Adjustment disorder with mixed anxiety and depressed mood   Anemia   HTN (hypertension)   Uncontrolled type 2 diabetes mellitus with hyperglycemia, without long-term current use of insulin (HCC)   Obesity, Class III, BMI 40-49.9 (morbid obesity) (HCC)   COPD (chronic obstructive pulmonary disease) (HCC)   Pure hypercholesterolemia   Hypokalemia   Fever   Sepsis ([place])   Bacteremia due to Klebsiella pneumoniae   Hypoglycemia  Resolved Problems:   * No resolved hospital problems. [place] [facility] Course:  [name] is a 71 y.o. male with medical history significant of hypertension, hyperlipidemia, thrombocytopenia, transaminitis, hepatitis C, anxiety, depression, diabetes, anemia, lymphedema, obesity, COPD, who presented to the hospital for syncope/loss of consciousness and fever.  Reportedly, he was found by a bystander.  He felt normal in the morning prior to the fall.  He had gone for a walk later in the day and remembers tripping over a curb.  He has a complaint of shortness of breath and increasing lower extremity swelling since he ran out of his diuretic.  He had been to the emergency room about 3 days prior to admission for syncope.   He was admitted to the hospital for syncope and fever.  Work-up revealed sepsis secondary to Klebsiella bacteremia.  He was treated with IV fluids and empiric IV antibiotics (initially treated with vancomycin, cefepime and Flagyl and de-escalated to ceftriaxone).  Blood culture showed Klebsiella pneumonia.  Patient is homeless and requested discharge to an assisted living facility.  Unfortunately,  patient could not get any offer from an assisted living facility.  He could not go to a homeless shelter because he said he had previous charges that had not been settled.  He said he was going to figure things out at discharge.  He is deemed medically stable for discharge.  He will be discharged on oral Levaquin to complete 8-day course of treatment.       Consultants: None Procedures performed: None Disposition: Home Diet recommendation:  Discharge Diet Orders (From admission, onward)     Start     Ordered   03/29/22 0000  Diet - low sodium heart healthy        03/29/22 0958   03/29/22 0000  Diet Carb Modified        03/29/22 0958           Cardiac diet DISCHARGE MEDICATION: Allergies as of 03/29/2022       Reactions   Novocain [procaine]    Sulfa Antibiotics Other (See Comments)        Medication List     STOP taking these medications    hydrOXYzine 10 MG tablet Commonly known as: ATARAX   Sofosbuvir-Velpatasvir 400-100 MG Tabs   tamsulosin 0.4 MG Caps capsule Commonly known as: FLOMAX       TAKE these medications    albuterol (2.5 MG/3ML) 0.083% nebulizer solution Commonly known as: PROVENTIL Take 3 mLs by nebulization every 4 (four) hours as needed for wheezing or shortness of breath.   albuterol 108 (90 Base) MCG/ACT inhaler Commonly known as: VENTOLIN HFA Inhale 2 puffs into the lungs  every 6 (six) hours as needed for wheezing or shortness of breath.   ascorbic acid 500 MG tablet Commonly known as: VITAMIN C Take 1 tablet (500 mg total) by mouth daily.   aspirin EC 81 MG tablet Take 1 tablet (81 mg total) by mouth daily. Swallow whole.   atorvastatin 40 MG tablet Commonly known as: LIPITOR Take 1 tablet (40 mg total) by mouth daily.   buprenorphine-naloxone 2-0.5 mg Subl SL tablet Commonly known as: SUBOXONE Place 1 tablet under the tongue in the morning and at bedtime. [name] drew   cetirizine 10 MG tablet Commonly known as: ZyrTEC  Allergy Take 1 tablet (10 mg total) by mouth daily.   cyclobenzaprine 5 MG tablet Commonly known as: FLEXERIL Take 5 mg by mouth at bedtime as needed for muscle spasms.   EPINEPHrine 0.3 mg/0.3 mL Soaj injection Commonly known as: EPI-PEN Inject 0.3 mg into the muscle as needed for anaphylaxis.   fluticasone 50 MCG/ACT nasal spray Commonly known as: FLONASE Place 1 spray into both nostrils daily.   furosemide 80 MG tablet Commonly known as: LASIX Take 1 tablet (80 mg total) by mouth daily for 14 days.   glucosamine-chondroitin 500-400 MG tablet Take 1 tablet by mouth 3 (three) times daily.   Jardiance 10 MG Tabs tablet Generic drug: empagliflozin Take 1 tablet (10 mg total) by mouth daily.   levofloxacin 750 MG tablet Commonly known as: Levaquin Take 1 tablet (750 mg total) by mouth daily for 2 days. Start taking on: March 30, 2022   lisinopril 10 MG tablet Commonly known as: ZESTRIL Take 3 tablets (30 mg total) by mouth daily for 14 days.   metFORMIN 500 MG 24 hr tablet Commonly known as: GLUCOPHAGE-XR Take 1 tablet (500 mg total) by mouth daily for 14 days.   multivitamin with minerals Tabs tablet Take 1 tablet by mouth daily.   nitroGLYCERIN 0.4 MG SL tablet Commonly known as: NITROSTAT Place 1 tablet (0.4 mg total) under the tongue every 5 (five) minutes x 3 doses as needed for up to 14 days for chest pain.   omeprazole 40 MG capsule Commonly known as: PRILOSEC Take 1 capsule (40 mg total) by mouth daily for 14 days.   PARoxetine 10 MG tablet Commonly known as: PAXIL Take 1 tablet (10 mg total) by mouth daily for 14 days.   sucralfate 1 GM/10ML suspension Commonly known as: CARAFATE Take 10 mLs (1 g total) by mouth 4 (four) times daily.        Discharge Exam: Filed Weights   03/26/22 0500 03/28/22 0500 03/29/22 0500  Weight: 128.9 kg 129.7 kg 125.3 kg   GEN: NAD SKIN: Multiple scabs on bilateral legs EYES: No pallor or icterus ENT: MMM CV:  RRR PULM: CTA B ABD: soft, obese, NT, +BS CNS: AAO x 3, non focal EXT: Bilateral leg edema, no tenderness   Condition at discharge: good  The results of significant diagnostics from this hospitalization (including imaging, microbiology, ancillary and laboratory) are listed below for reference.   Imaging Studies: DG Lumbar Spine Complete  Result Date: 03/26/2022 CLINICAL DATA:  Mid to low back pain EXAM: LUMBAR SPINE - COMPLETE 4+ VIEW COMPARISON:  01/26/2021 FINDINGS: No evidence of regional fracture or focal bone lesion. Mild lower lumbar degenerative disc disease and degenerative facet disease which could be symptomatic. No apparent change. Sacroiliac joints appear normal. IMPRESSION: Lower lumbar degenerative disc disease and degenerative facet disease, similar to radiography of June 2022, which could possibly be symptomatic.  Electronically Signed   By: Nelson Chimes M.D.   On: 03/26/2022 15:39   DG Thoracic Spine 2 View  Result Date: 03/26/2022 CLINICAL DATA:  Thoracic region back pain EXAM: THORACIC SPINE 2 VIEWS COMPARISON:  04/18/2018 FINDINGS: No significant malalignment. No evidence of regional fracture. Ordinary mild thoracic degenerative changes. Posteromedial ribs appear negative. IMPRESSION: Negative radiographs. Electronically Signed   By: Nelson Chimes M.D.   On: 03/26/2022 15:38   US Venous Img Lower Bilateral (DVT)  Result Date: 03/26/2022 CLINICAL DATA:  71 year old male with bilateral leg swelling and pain. EXAM: BILATERAL LOWER EXTREMITY VENOUS DOPPLER ULTRASOUND TECHNIQUE: Gray-scale sonography with graded compression, as well as color Doppler and duplex ultrasound were performed to evaluate the lower extremity deep venous systems from the level of the common femoral vein and including the common femoral, femoral, profunda femoral, popliteal and calf veins including the posterior tibial, peroneal and gastrocnemius veins when visible. The superficial great saphenous vein was  also interrogated. Spectral Doppler was utilized to evaluate flow at rest and with distal augmentation maneuvers in the common femoral, femoral and popliteal veins. COMPARISON:  None Available. FINDINGS: RIGHT LOWER EXTREMITY Common Femoral Vein: No evidence of thrombus. Normal compressibility, respiratory phasicity and response to augmentation. Saphenofemoral Junction: No evidence of thrombus. Normal compressibility and flow on color Doppler imaging. Profunda Femoral Vein: No evidence of thrombus. Normal compressibility and flow on color Doppler imaging. Femoral Vein: No evidence of thrombus. Normal compressibility, respiratory phasicity and response to augmentation. Popliteal Vein: No evidence of thrombus. Normal compressibility, respiratory phasicity and response to augmentation. Calf Veins: The peroneal vein is not visualized. No evidence of thrombus. Normal compressibility and flow on color Doppler imaging. Other Findings:  None. LEFT LOWER EXTREMITY Common Femoral Vein: No evidence of thrombus. Normal compressibility, respiratory phasicity and response to augmentation. Saphenofemoral Junction: No evidence of thrombus. Normal compressibility and flow on color Doppler imaging. Profunda Femoral Vein: No evidence of thrombus. Normal compressibility and flow on color Doppler imaging. Femoral Vein: No evidence of thrombus. Normal compressibility, respiratory phasicity and response to augmentation. Popliteal Vein: The peroneal vein is not visualized. No evidence of thrombus. Normal compressibility, respiratory phasicity and response to augmentation. Calf Veins: No evidence of thrombus. Normal compressibility and flow on color Doppler imaging. Other Findings:  None. IMPRESSION: No evidence of bilateral lower extremity deep venous thrombosis. Ruthann Cancer, MD [facility] Electronically Signed   By: Ruthann Cancer M.D.   On: 03/26/2022 07:57   ECHOCARDIOGRAM  COMPLETE  Result Date: 03/25/2022    ECHOCARDIOGRAM REPORT   Patient Name:   THEOTIS GERDEMAN Date of Exam: 03/25/2022 Medical Rec #:  [id number]      Height:       66.0 in Accession #:    [id number]     Weight:       281.3 lb Date of Birth:  07/08/1951     BSA:          2.312 m Patient Age:    72 years       BP:           134/66 mmHg Patient Gender: M              HR:           56 bpm. Exam Location:  [facility] Procedure: 2D Echo and Intracardiac Opacification Agent Indications:     Syncope  History:         Patient has prior history of  Echocardiogram examinations. COPD;                  Risk Factors:Hypertension, Diabetes, Dyslipidemia and 66/281lbs                  Morbid Obesity.  Sonographer:     L. Thornton-Maynard Referring Phys:  [id number] [place] Diagnosing Phys: Neoma Laming  Sonographer Comments: Image acquisition challenging due to patient body habitus. IMPRESSIONS  1. Left ventricular ejection fraction, by estimation, is 60 to 65%. The left ventricle has normal function. The left ventricle has no regional wall motion abnormalities. Left ventricular diastolic parameters were normal.  2. Right ventricular systolic function is normal. The right ventricular size is normal.  3. The mitral valve is normal in structure. No evidence of mitral valve regurgitation. No evidence of mitral stenosis.  4. The aortic valve is normal in structure. Aortic valve regurgitation is not visualized. Aortic valve sclerosis is present, with no evidence of aortic valve stenosis.  5. The inferior vena cava is normal in size with greater than 50% respiratory variability, suggesting right atrial pressure of 3 mmHg. FINDINGS  Left Ventricle: Left ventricular ejection fraction, by estimation, is 60 to 65%. The left ventricle has normal function. The left ventricle has no regional wall motion abnormalities. Definity contrast agent was given IV to delineate the left ventricular  endocardial borders. The left ventricular internal  cavity size was normal in size. There is no left ventricular hypertrophy. Left ventricular diastolic parameters were normal. Right Ventricle: The right ventricular size is normal. No increase in right ventricular wall thickness. Right ventricular systolic function is normal. Left Atrium: Left atrial size was normal in size. Right Atrium: Right atrial size was normal in size. Pericardium: There is no evidence of pericardial effusion. Mitral Valve: The mitral valve is normal in structure. No evidence of mitral valve regurgitation. No evidence of mitral valve stenosis. Tricuspid Valve: The tricuspid valve is normal in structure. Tricuspid valve regurgitation is not demonstrated. No evidence of tricuspid stenosis. Aortic Valve: The aortic valve is normal in structure. Aortic valve regurgitation is not visualized. Aortic valve sclerosis is present, with no evidence of aortic valve stenosis. Aortic valve peak gradient measures 13.5 mmHg. Pulmonic Valve: The pulmonic valve was normal in structure. Pulmonic valve regurgitation is not visualized. No evidence of pulmonic stenosis. Aorta: The aortic root is normal in size and structure. Venous: The inferior vena cava is normal in size with greater than 50% respiratory variability, suggesting right atrial pressure of 3 mmHg. IAS/Shunts: No atrial level shunt detected by color flow Doppler.  LEFT VENTRICLE PLAX 2D LVOT diam:     2.40 cm      Diastology LV SV:         99           LV e' medial:    7.51 cm/s LV SV Index:   43           LV E/e' medial:  14.6 LVOT Area:     4.52 cm     LV e' lateral:   9.29 cm/s                             LV E/e' lateral: 11.8  LV Volumes (MOD) LV vol d, MOD A2C: 104.0 ml LV vol d, MOD A4C: 110.0 ml LV vol s, MOD A2C: 32.8 ml LV vol s, MOD A4C: 43.8 ml LV SV MOD A2C:  71.2 ml LV SV MOD A4C:     110.0 ml LV SV MOD BP:      70.2 ml RIGHT VENTRICLE RV S prime:     12.40 cm/s TAPSE (M-mode): 2.9 cm LEFT ATRIUM              Index        RIGHT ATRIUM            Index LA Vol (A2C):   78.2 ml  33.83 ml/m  RA Area:     19.30 cm LA Vol (A4C):   104.0 ml 44.99 ml/m  RA Volume:   50.90 ml  22.02 ml/m LA Biplane Vol: 92.1 ml  39.84 ml/m  AORTIC VALVE                 PULMONIC VALVE AV Area (Vmax): 2.13 cm     PV Vmax:       1.19 m/s AV Vmax:        184.00 cm/s  PV Peak grad:  5.7 mmHg AV Peak Grad:   13.5 mmHg LVOT Vmax:      86.80 cm/s LVOT Vmean:     63.400 cm/s LVOT VTI:       0.219 m  AORTA Ao Root diam: 3.00 cm Ao Asc diam:  3.70 cm MITRAL VALVE MV Area (PHT): 3.03 cm     SHUNTS MV Decel Time: 250 msec     Systemic VTI:  0.22 m MV E velocity: 110.00 cm/s  Systemic Diam: 2.40 cm MV A velocity: 64.30 cm/s MV E/A ratio:  1.71 Shaukat Khan Electronically signed by Neoma Laming Signature Date/Time: 03/25/2022/11:45:39 AM    Final    CT ABDOMEN PELVIS W CONTRAST  Result Date: 03/25/2022 CLINICAL DATA:  Fever. Question sepsis. EXAM: CT ABDOMEN AND PELVIS WITH CONTRAST TECHNIQUE: Multidetector CT imaging of the abdomen and pelvis was performed using the standard protocol following bolus administration of intravenous contrast. RADIATION DOSE REDUCTION: This exam was performed according to the departmental dose-optimization program which includes automated exposure control, adjustment of the mA and/or kV according to patient size and/or use of iterative reconstruction technique. CONTRAST:  178m OMNIPAQUE IOHEXOL 300 MG/ML  SOLN COMPARISON:  CT of the abdomen and pelvis 02/17/2020 FINDINGS: Lower chest: Mild dependent atelectasis is present. A 4 mm pleural based nodule is noted in the left lower lobe on image 26 of series 4. Lungs are otherwise clear. Heart size is normal. Hepatobiliary: A somewhat nodular contour of the liver is again noted. No discrete lesions are present. The common bile duct is progressively enlarged, now measuring 27 mm. No obstructing lesion is present. Cholecystectomy noted. Pancreas: Unremarkable. No pancreatic ductal dilatation or  surrounding inflammatory changes. Spleen: Normal in size without focal abnormality. Splenules are stable. Adrenals/Urinary Tract: The adrenal glands are within normal limits bilaterally. Three nonobstructing stones are present at the lower pole of the right kidney. The largest measures 7 mm. Additional punctate nonobstructing stone is present in the midportion of the right kidney. No significant left-sided nephrolithiasis. No obstruction is present. Ureters are within normal limits. Urinary bladder is normal. Stomach/Bowel: The stomach and duodenum are within normal limits. Small bowel is unremarkable. Terminal ileum is normal. The appendix is visualized and within normal limits. The ascending and transverse colon are normal. The descending and sigmoid colon are normal. Vascular/Lymphatic: R subcentimeter para-aortic nodes are present. A single enlarged node at the level of the IMA takeoff has slightly increased in size, now measuring 12.5 mm. It  was 11 mm previously. No other significant adenopathy is present. Reproductive: Prostate is unremarkable. Other: No abdominal wall hernia or abnormality. No abdominopelvic ascites. Musculoskeletal: No acute or significant osseous findings. Subcutaneous edema is present bilaterally. No discrete fluid collections are present. IMPRESSION: 1. No acute or focal lesion to explain the patient's fever. 2. Progressive dilation of the common bile duct, now measuring 27 mm. No obstructing lesion is present. This could be related to prior cholecystectomy. Recommend correlation with liver function tests. 3. Nonobstructing stones at the lower pole of the right kidney. 4. Slight increase in size of a single enlarged node at the level of the IMA takeoff, now measuring 12.5 mm. It was 11 mm previously. No other significant adenopathy is present. 5. 4 mm pleural based nodule in the left lower lobe. Electronically Signed   By: San Morelle M.D.   On: 03/25/2022 11:12   DG Chest  Portable 1 View  Result Date: 03/24/2022 CLINICAL DATA:  Fever. EXAM: PORTABLE CHEST 1 VIEW COMPARISON:  Chest radiograph 03/21/2022; CT angio chest 03/18/2020. FINDINGS: Stable cardiomediastinal silhouette. Mild tortuosity of the thoracic aorta with aortic calcifications. New diffuse interstitial opacities. No focal consolidation, large volume pleural effusion, or pneumothorax. Visualized osseous structures are unremarkable. IMPRESSION: New diffuse interstitial opacities may reflect mild pulmonary edema or possibly atypical infection in the appropriate clinical setting. Electronically Signed   By: Ileana Roup M.D.   On: 03/24/2022 21:16   DG Hand Complete Right  Result Date: 03/24/2022 CLINICAL DATA:  Syncope.  Abrasion to left pinky finger. EXAM: RIGHT HAND - COMPLETE 3+ VIEW COMPARISON:  None Available. FINDINGS: No fractures or dislocations. The fifth finger is flexed at the distal interphalangeal joint. No fractures. IMPRESSION: No fractures or foreign bodies. The fifth finger is flexed at the distal interphalangeal joint. This is an age-indeterminate finding. Recommend clinical correlation. Electronically Signed   By: Dorise Bullion III M.D.   On: 03/24/2022 20:02   CT HEAD WO CONTRAST (5MM)  Result Date: 03/21/2022 CLINICAL DATA:  Mental status change EXAM: CT HEAD WITHOUT CONTRAST TECHNIQUE: Contiguous axial images were obtained from the base of the skull through the vertex without intravenous contrast. RADIATION DOSE REDUCTION: This exam was performed according to the departmental dose-optimization program which includes automated exposure control, adjustment of the mA and/or kV according to patient size and/or use of iterative reconstruction technique. COMPARISON:  CT 03/15/2022, MRI 07/31/2021 FINDINGS: Brain: No acute territorial infarction, hemorrhage or intracranial mass. Mild atrophy. Nonenlarged ventricles. Vascular: No hyperdense vessels.  No unexpected calcification Skull: Normal.  Negative for fracture or focal lesion. Sinuses/Orbits: No acute finding. Other: None IMPRESSION: No CT evidence for acute intracranial abnormality.  Mild atrophy Electronically Signed   By: Donavan Foil M.D.   On: 03/21/2022 17:24   DG Chest 2 View  Result Date: 03/21/2022 CLINICAL DATA:  Shortness of breath EXAM: CHEST - 2 VIEW COMPARISON:  03/10/2022 FINDINGS: Cardiac size is within normal limits. There are no signs of pulmonary edema or focal pulmonary consolidation. Small linear densities in the left lower lung fields suggest scarring or subsegmental atelectasis. There is no pleural effusion or pneumothorax. IMPRESSION: Small linear densities in left lower lung field suggesting scarring or subsegmental atelectasis. There are no signs of pulmonary edema or focal pulmonary consolidation. Electronically Signed   By: Elmer Picker M.D.   On: 03/21/2022 12:52   CT HEAD WO CONTRAST (5MM)  Result Date: 03/15/2022 CLINICAL DATA:  Altered mental status EXAM: CT HEAD WITHOUT CONTRAST TECHNIQUE:  Contiguous axial images were obtained from the base of the skull through the vertex without intravenous contrast. RADIATION DOSE REDUCTION: This exam was performed according to the departmental dose-optimization program which includes automated exposure control, adjustment of the mA and/or kV according to patient size and/or use of iterative reconstruction technique. COMPARISON:  03/10/2022 FINDINGS: Brain: There is no mass, hemorrhage or extra-axial collection. The size and configuration of the ventricles and extra-axial CSF spaces are normal. The brain parenchyma is normal, without acute or chronic infarction. Vascular: No abnormal hyperdensity of the major intracranial arteries or dural venous sinuses. No intracranial atherosclerosis. Skull: The visualized skull base, calvarium and extracranial soft tissues are normal. Sinuses/Orbits: No fluid levels or advanced mucosal thickening of the visualized paranasal  sinuses. No mastoid or middle ear effusion. The orbits are normal. IMPRESSION: Normal head CT. Electronically Signed   By: Ulyses Jarred M.D.   On: 03/15/2022 03:55   CT HEAD WO CONTRAST (5MM)  Result Date: 03/10/2022 CLINICAL DATA:  Fall off stretcher.  Blunt head and neck trauma. EXAM: CT HEAD WITHOUT CONTRAST CT CERVICAL SPINE WITHOUT CONTRAST TECHNIQUE: Multidetector CT imaging of the head and cervical spine was performed following the standard protocol without intravenous contrast. Multiplanar CT image reconstructions of the cervical spine were also generated. RADIATION DOSE REDUCTION: This exam was performed according to the departmental dose-optimization program which includes automated exposure control, adjustment of the mA and/or kV according to patient size and/or use of iterative reconstruction technique. COMPARISON:  None Available. FINDINGS: CT HEAD FINDINGS Brain: No evidence of intracranial hemorrhage, acute infarction, hydrocephalus, extra-axial collection, or mass lesion/mass effect. Vascular:  No hyperdense vessel or other acute findings. Skull: No evidence of fracture or other significant bone abnormality. Sinuses/Orbits:  No acute findings. Other: None. CT CERVICAL SPINE FINDINGS Alignment: Normal. Skull base and vertebrae: No acute fracture. No primary bone lesion or focal pathologic process. Soft tissues and spinal canal: No prevertebral fluid or swelling. No visible canal hematoma. Disc levels: Moderate degenerative disc disease is seen from levels of C3 to C7. Atlantoaxial degenerative changes are also seen. Upper chest: No acute findings. Other: None. IMPRESSION: Negative noncontrast head CT. No evidence of fracture or subluxation of the cervical spine. Degenerative spondylosis, as described above. Electronically Signed   By: Marlaine Hind M.D.   On: 03/10/2022 14:39   CT Cervical Spine Wo Contrast  Result Date: 03/10/2022 CLINICAL DATA:  Fall off stretcher.  Blunt head and neck  trauma. EXAM: CT HEAD WITHOUT CONTRAST CT CERVICAL SPINE WITHOUT CONTRAST TECHNIQUE: Multidetector CT imaging of the head and cervical spine was performed following the standard protocol without intravenous contrast. Multiplanar CT image reconstructions of the cervical spine were also generated. RADIATION DOSE REDUCTION: This exam was performed according to the departmental dose-optimization program which includes automated exposure control, adjustment of the mA and/or kV according to patient size and/or use of iterative reconstruction technique. COMPARISON:  None Available. FINDINGS: CT HEAD FINDINGS Brain: No evidence of intracranial hemorrhage, acute infarction, hydrocephalus, extra-axial collection, or mass lesion/mass effect. Vascular:  No hyperdense vessel or other acute findings. Skull: No evidence of fracture or other significant bone abnormality. Sinuses/Orbits:  No acute findings. Other: None. CT CERVICAL SPINE FINDINGS Alignment: Normal. Skull base and vertebrae: No acute fracture. No primary bone lesion or focal pathologic process. Soft tissues and spinal canal: No prevertebral fluid or swelling. No visible canal hematoma. Disc levels: Moderate degenerative disc disease is seen from levels of C3 to C7. Atlantoaxial degenerative changes are also  seen. Upper chest: No acute findings. Other: None. IMPRESSION: Negative noncontrast head CT. No evidence of fracture or subluxation of the cervical spine. Degenerative spondylosis, as described above. Electronically Signed   By: Marlaine Hind M.D.   On: 03/10/2022 14:39   DG Chest 2 View  Result Date: 03/10/2022 CLINICAL DATA:  Chest pain radiating to left arm. Lower extremity swelling. EXAM: CHEST - 2 VIEW COMPARISON:  12/01/2021 FINDINGS: The heart size and mediastinal contours are within normal limits. Aortic atherosclerotic calcification incidentally noted. Both lungs are clear. The visualized skeletal structures are unremarkable. IMPRESSION: No active  cardiopulmonary disease. Electronically Signed   By: Marlaine Hind M.D.   On: 03/10/2022 10:02   DG Knee Complete 4 Views Left  Result Date: 03/05/2022 CLINICAL DATA:  Left knee pain after trip and fall. Unable to bear weight. EXAM: LEFT KNEE - COMPLETE 4+ VIEW COMPARISON:  Left knee radiographs 01/25/2021 FINDINGS: Severe medial compartment joint space narrowing. Mild peripheral degenerative osteophytosis. Tiny joint effusion. Mild superior and inferior patellar degenerative osteophytes. Minimal chronic enthesopathic changes at the quadriceps and patellar tendon insertions on the patella, similar to prior. No acute fracture is seen. No dislocation. 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 Tiny joint effusion. Electronically Signed   By: Yvonne Kendall M.D.   On: 03/05/2022 17:06    Microbiology: Results for orders placed or performed during the hospital encounter of 03/24/22  Blood culture (routine x 2)     Status: Abnormal   Collection Time: 03/24/22  7:34 PM   Specimen: BLOOD  Result Value Ref Range Status   Specimen Description   Final    BLOOD RIGHT ANTECUBITAL Performed at [facility], [address], [place], [place] [address]    Special Requests   Final    BOTTLES DRAWN AEROBIC AND ANAEROBIC Blood Culture results may not be optimal due to an excessive volume of blood received in culture bottles Performed at [facility], [address] [place], [place] [address]    Culture  Setup Time   Final    Organism ID to follow [place] AND ANAEROBIC BOTTLES CRITICAL RESULT CALLED TO, READ BACK BY AND VERIFIED WITH: WILL ANDERSON '@0835'$  03/25/22 MJU Performed at [facility], [address] [place], [place] [address]    Culture KLEBSIELLA PNEUMONIAE (A)  Final   Report Status 03/27/2022 FINAL  Final   Organism ID, Bacteria KLEBSIELLA PNEUMONIAE  Final      Susceptibility   Klebsiella pneumoniae -  MIC*    AMPICILLIN RESISTANT Resistant     CEFAZOLIN <=4 SENSITIVE Sensitive     CEFEPIME <=0.12 SENSITIVE Sensitive     CEFTAZIDIME <=1 SENSITIVE Sensitive     CEFTRIAXONE <=0.25 SENSITIVE Sensitive     CIPROFLOXACIN <=0.25 SENSITIVE Sensitive     GENTAMICIN <=1 SENSITIVE Sensitive     IMIPENEM <=0.25 SENSITIVE Sensitive     TRIMETH/SULFA <=20 SENSITIVE Sensitive     AMPICILLIN/SULBACTAM <=2 SENSITIVE Sensitive     PIP/TAZO <=4 SENSITIVE Sensitive     * KLEBSIELLA PNEUMONIAE  Blood Culture ID Panel (Reflexed)     Status: Abnormal   Collection Time: 03/24/22  7:34 PM  Result Value Ref Range Status   Enterococcus faecalis NOT DETECTED NOT DETECTED Final   Enterococcus Faecium NOT DETECTED NOT DETECTED Final   Listeria monocytogenes NOT DETECTED NOT DETECTED Final   Staphylococcus species NOT DETECTED NOT DETECTED Final   Staphylococcus aureus (BCID) NOT DETECTED NOT DETECTED  Final   Staphylococcus epidermidis NOT DETECTED NOT DETECTED Final   Staphylococcus lugdunensis NOT DETECTED NOT DETECTED Final   Streptococcus species NOT DETECTED NOT DETECTED Final   Streptococcus agalactiae NOT DETECTED NOT DETECTED Final   Streptococcus pneumoniae NOT DETECTED NOT DETECTED Final   Streptococcus pyogenes NOT DETECTED NOT DETECTED Final   A.calcoaceticus-baumannii NOT DETECTED NOT DETECTED Final   Bacteroides fragilis NOT DETECTED NOT DETECTED Final   Enterobacterales DETECTED (A) NOT DETECTED Final    Comment: Enterobacterales represent a large order of gram negative bacteria, not a single organism. CRITICAL RESULT CALLED TO, READ BACK BY AND VERIFIED WITH: WILL ANDERSON '@0835'$  03/25/22 MJU    Enterobacter cloacae complex NOT DETECTED NOT DETECTED Final   Escherichia coli NOT DETECTED NOT DETECTED Final   Klebsiella aerogenes NOT DETECTED NOT DETECTED Final   Klebsiella oxytoca NOT DETECTED NOT DETECTED Final   Klebsiella pneumoniae DETECTED (A) NOT DETECTED Final    Comment: CRITICAL  RESULT CALLED TO, READ BACK BY AND VERIFIED WITH: WILL ANDERSON '@0835'$  03/25/22 MJU    Proteus species NOT DETECTED NOT DETECTED Final   Salmonella species NOT DETECTED NOT DETECTED Final   Serratia marcescens NOT DETECTED NOT DETECTED Final   Haemophilus influenzae NOT DETECTED NOT DETECTED Final   Neisseria meningitidis NOT DETECTED NOT DETECTED Final   Pseudomonas aeruginosa NOT DETECTED NOT DETECTED Final   Stenotrophomonas maltophilia NOT DETECTED NOT DETECTED Final   Candida albicans NOT DETECTED NOT DETECTED Final   Candida auris NOT DETECTED NOT DETECTED Final   Candida glabrata NOT DETECTED NOT DETECTED Final   Candida krusei NOT DETECTED NOT DETECTED Final   Candida parapsilosis NOT DETECTED NOT DETECTED Final   Candida tropicalis NOT DETECTED NOT DETECTED Final   Cryptococcus neoformans/gattii NOT DETECTED NOT DETECTED Final   CTX-M ESBL NOT DETECTED NOT DETECTED Final   Carbapenem resistance IMP NOT DETECTED NOT DETECTED Final   Carbapenem resistance KPC NOT DETECTED NOT DETECTED Final   Carbapenem resistance NDM NOT DETECTED NOT DETECTED Final   Carbapenem resist OXA 48 LIKE NOT DETECTED NOT DETECTED Final   Carbapenem resistance VIM NOT DETECTED NOT DETECTED Final    Comment: Performed at [facility], [address] [place], [place] [address]  Blood culture (routine x 2)     Status: Abnormal   Collection Time: 03/24/22  7:39 PM   Specimen: BLOOD  Result Value Ref Range Status   Specimen Description   Final    BLOOD LEFT ANTECUBITAL Performed at [facility], [address], [place], [place] [address]    Special Requests   Final    BOTTLES DRAWN AEROBIC AND ANAEROBIC Blood Culture adequate volume Performed at [facility], [address] [place], [place] [address]    Culture  Setup Time   Final    AEROBIC BOTTLE ONLY GRAM NEGATIVE RODS CRITICAL RESULT CALLED TO, READ BACK BY AND VERIFIED WITH: WILL ANDERSON '@0835'$  03/25/22  MJU Performed at [facility], [address] [place], [place] [address]    Culture (A)  Final    KLEBSIELLA PNEUMONIAE SUSCEPTIBILITIES PERFORMED ON PREVIOUS CULTURE WITHIN THE LAST 5 DAYS. Performed at [facility], [place] [address], [place],  [address]    Report Status 03/27/2022 FINAL  Final  Respiratory (~20 pathogens) panel by PCR     Status: None   Collection Time: 03/24/22 10:18 PM   Specimen: Nasopharyngeal Swab; Respiratory  Result Value Ref Range Status   Adenovirus NOT DETECTED NOT DETECTED  Final   Coronavirus 229E NOT DETECTED NOT DETECTED Final    Comment: (NOTE) The Coronavirus on the Respiratory Panel, DOES NOT test for the novel  Coronavirus (2019 nCoV)    Coronavirus HKU1 NOT DETECTED NOT DETECTED Final   Coronavirus NL63 NOT DETECTED NOT DETECTED Final   Coronavirus OC43 NOT DETECTED NOT DETECTED Final   Metapneumovirus NOT DETECTED NOT DETECTED Final   Rhinovirus / Enterovirus NOT DETECTED NOT DETECTED Final   Influenza A NOT DETECTED NOT DETECTED Final   Influenza B NOT DETECTED NOT DETECTED Final   Parainfluenza Virus 1 NOT DETECTED NOT DETECTED Final   Parainfluenza Virus 2 NOT DETECTED NOT DETECTED Final   Parainfluenza Virus 3 NOT DETECTED NOT DETECTED Final   Parainfluenza Virus 4 NOT DETECTED NOT DETECTED Final   Respiratory Syncytial Virus NOT DETECTED NOT DETECTED Final   Bordetella pertussis NOT DETECTED NOT DETECTED Final   Bordetella Parapertussis NOT DETECTED NOT DETECTED Final   Chlamydophila pneumoniae NOT DETECTED NOT DETECTED Final   Mycoplasma pneumoniae NOT DETECTED NOT DETECTED Final    Comment: Performed at [facility], [place] [address] [place], [place] [address]  SARS Coronavirus 2 by RT PCR (hospital order, performed in [facility] hospital lab) *cepheid single result test* Anterior Nasal Swab     Status: None   Collection Time: 03/25/22 10:15 AM   Specimen: Anterior Nasal Swab  Result Value Ref Range Status    SARS Coronavirus 2 by RT PCR NEGATIVE NEGATIVE Final    Comment: (NOTE) SARS-CoV-2 target nucleic acids are NOT DETECTED.  The SARS-CoV-2 RNA is generally detectable in upper and lower respiratory specimens during the acute phase of infection. The lowest concentration of SARS-CoV-2 viral copies this assay can detect is 250 copies / mL. A negative result does not preclude SARS-CoV-2 infection and should not be used as the sole basis for treatment or other patient management decisions.  A negative result may occur with improper specimen collection / handling, submission of specimen other than nasopharyngeal swab, presence of viral mutation(s) within the areas targeted by this assay, and inadequate number of viral copies (<250 copies / mL). A negative result must be combined with clinical observations, patient history, and epidemiological information.  Fact Sheet for Patients:   https://www.patel.info/  Fact Sheet for Healthcare Providers: https://hall.com/  This test is not yet approved or  cleared by the [place] FDA and has been authorized for detection and/or diagnosis of SARS-CoV-2 by FDA under an Emergency Use Authorization (EUA).  This EUA will remain in effect (meaning this test can be used) for the duration of the COVID-19 declaration under Section 564(b)(1) of the Act, 21 U.S.C. section 360bbb-3(b)(1), unless the authorization is terminated or revoked sooner.  Performed at [facility], [address] [place], [place] [address]     Labs: CBC: Recent Labs  Lab 03/24/22 1927 03/25/22 0521  WBC 4.9 7.1  HGB 10.9* 10.1*  HCT 33.4* 30.5*  MCV 92.3 91.6  PLT 117* 89*   Basic Metabolic Panel: Recent Labs  Lab 03/24/22 1927 03/24/22 2030 03/25/22 0521  NA 136  --  137  K 3.4*  --  4.0  CL 105  --  108  CO2 24  --  22  GLUCOSE 125*  --  109*  BUN 11  --  12  CREATININE 1.22  --  1.15  CALCIUM 9.2   --  9.0  MG  --  1.6*  --    Liver Function  Tests: Recent Labs  Lab 03/25/22 0521  AST 62*  ALT 42  ALKPHOS 54  BILITOT 2.5*  PROT 5.7*  ALBUMIN 2.4*   CBG: Recent Labs  Lab 03/28/22 1130 03/28/22 1632 03/28/22 2102 03/29/22 0506 03/29/22 0733  GLUCAP 134* 108* 115* 90 79    Discharge time spent: greater than 30 minutes.  Signed: Jennye Boroughs, MD [facility] 03/29/2022

## 2022-03-29 NOTE — Plan of Care (Signed)
  Problem: Health Behavior/Discharge Planning: Goal: Ability to manage health-related needs will improve Outcome: Progressing   Problem: Clinical Measurements: Goal: Ability to maintain clinical measurements within normal limits will improve Outcome: Progressing Goal: Diagnostic test results will improve Outcome: Progressing Goal: Cardiovascular complication will be avoided Outcome: Progressing   Problem: Activity: Goal: Risk for activity intolerance will decrease Outcome: Progressing   Problem: Nutrition: Goal: Adequate nutrition will be maintained Outcome: Progressing

## 2022-03-29 NOTE — Progress Notes (Signed)
Occupational Therapy Treatment Patient Details Name: [name] MRN: [id number] DOB: Jan 19, 1951 Today's Date: 03/29/2022   History of present illness Pt is a 71 y/o M admitted on 03/24/22 after presenting with c/o episode of LOC/syncope & fever. Of note, pt was in the ED 3 days prior for syncope. Pt also found to have sepsis 2/2 klebsiella bacteremia. PMH: HTN, HLD, thrombocytopenia, transaminitis, Hep C, anxiety, depression, DM, anemia, lymphadema, obesity, COPD   OT comments  Upon entering the room, pt supine in bed and agreeable to OT intervention. Pt is very pleasant and cooperative. He currently has discharge order placed. OT provided pt with bags of his personal items while he is seated on EOB. Pt getting all needed items out and use of figure four position to don underwear and pants with increased time and effort but no physical assistance. Pt standing to pull clothing over B hips independently. RN arrives to disconnect IV for pt to don pull over shirt. Pt is mod  I with all self care tasks and functional mobility. He does have chronic back pain but no c/o pain during this session.    Recommendations for follow up therapy are one component of a multi-disciplinary discharge planning process, led by the attending physician.  Recommendations may be updated based on patient status, additional functional criteria and insurance authorization.    Follow Up Recommendations  No OT follow up    Assistance Recommended at Discharge Intermittent Supervision/Assistance  Patient can return home with the following  A little help with walking and/or transfers;A little help with bathing/dressing/bathroom;Assist for transportation;Help with stairs or ramp for entrance   Equipment Recommendations  None recommended by OT       Precautions / Restrictions Precautions Precautions: Fall       Mobility Bed Mobility Overal bed mobility: Modified Independent Bed Mobility: Supine to Sit, Sit to Supine                 Transfers Overall transfer level: Modified independent Equipment used: None Transfers: Sit to/from Stand Sit to Stand: Modified independent (Device/Increase time)                 Balance Overall balance assessment: Needs assistance, History of Falls Sitting-balance support: Feet supported Sitting balance-Leahy Scale: Good     Standing balance support: During functional activity, No upper extremity supported Standing balance-Leahy Scale: Fair                             ADL either performed or assessed with clinical judgement   ADL Overall ADL's : Modified independent                     Lower Body Dressing: Modified independent;Sitting/lateral leans;Sit to/from stand Lower Body Dressing Details (indicate cue type and reason): use of figure four position while seated on EOB                    Extremity/Trunk Assessment Upper Extremity Assessment Upper Extremity Assessment: Overall WFL for tasks assessed   Lower Extremity Assessment Lower Extremity Assessment: Generalized weakness        Vision Patient Visual Report: No change from baseline            Cognition Arousal/Alertness: Awake/alert Behavior During Therapy: WFL for tasks assessed/performed Overall Cognitive Status: Within Functional Limits for tasks assessed  Pertinent Vitals/ Pain       Pain Assessment Pain Assessment: No/denies pain         Frequency  Min 2X/week        Progress Toward Goals  OT Goals(current goals can now be found in the care plan section)  Progress towards OT goals: Progressing toward goals  Acute Rehab OT Goals Patient Stated Goal: to return to PLOF OT Goal Formulation: With patient Time For Goal Achievement: 04/09/22 Potential to Achieve Goals: Good  Plan Discharge plan remains appropriate;Frequency remains appropriate       AM-PAC OT "6  Clicks" Daily Activity     Outcome Measure   Help from another person eating meals?: None Help from another person taking care of personal grooming?: None Help from another person toileting, which includes using toliet, bedpan, or urinal?: None Help from another person bathing (including washing, rinsing, drying)?: None Help from another person to put on and taking off regular upper body clothing?: None Help from another person to put on and taking off regular lower body clothing?: None 6 Click Score: 24    End of Session    OT Visit Diagnosis: Other abnormalities of gait and mobility (R26.89);Muscle weakness (generalized) (M62.81)   Activity Tolerance Patient tolerated treatment well   Patient Left in bed;with nursing/sitter in room;with call bell/phone within reach   Nurse Communication Mobility status        Time: 2993-7169 OT Time Calculation (min): 14 min  Charges: OT General Charges $OT Visit: 1 Visit OT Treatments $Self Care/Home Management : 8-22 mins  Darleen Crocker, MS, OTR/L , CBIS ascom [phone]  03/29/22, 11:23 AM

## 2022-04-06 ENCOUNTER — Emergency Department: Payer: Medicare Other

## 2022-04-06 ENCOUNTER — Emergency Department
Admission: EM | Admit: 2022-04-06 | Discharge: 2022-04-07 | Disposition: A | Discharge status: Home / Self Care | Payer: Medicare Other | Attending: Emergency Medicine | Admitting: Emergency Medicine

## 2022-04-06 ENCOUNTER — Encounter: Payer: Self-pay | Admitting: Medical Oncology

## 2022-04-06 DIAGNOSIS — M7989 Other specified soft tissue disorders: Secondary | ICD-10-CM | POA: Insufficient documentation

## 2022-04-06 DIAGNOSIS — R059 Cough, unspecified: Secondary | ICD-10-CM | POA: Insufficient documentation

## 2022-04-06 DIAGNOSIS — Z760 Encounter for issue of repeat prescription: Secondary | ICD-10-CM | POA: Insufficient documentation

## 2022-04-06 LAB — CBC
HCT: 33.5 % — ABNORMAL LOW (ref 39.0–52.0)
Hemoglobin: 10.8 g/dL — ABNORMAL LOW (ref 13.0–17.0)
MCH: 30.1 pg (ref 26.0–34.0)
MCHC: 32.2 g/dL (ref 30.0–36.0)
MCV: 93.3 fL (ref 80.0–100.0)
Platelets: 109 10*3/uL — ABNORMAL LOW (ref 150–400)
RBC: 3.59 MIL/uL — ABNORMAL LOW (ref 4.22–5.81)
RDW: 15.1 % (ref 11.5–15.5)
WBC: 4.3 10*3/uL (ref 4.0–10.5)
nRBC: 0 % (ref 0.0–0.2)

## 2022-04-06 LAB — BASIC METABOLIC PANEL
Anion gap: 8 (ref 5–15)
BUN: 13 mg/dL (ref 8–23)
CO2: 28 mmol/L (ref 22–32)
Calcium: 10.1 mg/dL (ref 8.9–10.3)
Chloride: 103 mmol/L (ref 98–111)
Creatinine, Ser: 1.19 mg/dL (ref 0.61–1.24)
GFR, Estimated: >60 mL/min (ref >60)
Glucose, Bld: 101 mg/dL — ABNORMAL HIGH (ref 70–99)
Potassium: 3.7 mmol/L (ref 3.5–5.1)
Sodium: 139 mmol/L (ref 135–145)

## 2022-04-06 NOTE — ED Notes (Signed)
Pt states he has sleep apnea and was placed on 2L [place] while sleeping.

## 2022-04-06 NOTE — ED Provider Triage Note (Signed)
Emergency Medicine Provider Triage Evaluation Note  [name] , a 71 y.o. male  was evaluated in triage.  Pt complains of edema to lower extremities. He was here last week for the same. Once discharged, he was unable to afford to fill Rx.  Physical Exam  BP (!) 147/71 (BP Location: Left Arm)   Pulse 70   Temp 99.4 F (37.4 C) (Oral)   Resp 18   Ht '5\' 6"'$  (1.676 m)   Wt 125 kg   SpO2 96%   BMI 44.48 kg/m  Gen:   Awake, no distress   Resp:  Normal effort  MSK:   Moves extremities without difficulty  Other:    Medical Decision Making  Medically screening exam initiated at 5:20 PM.  Appropriate orders placed.  [name] was informed that the remainder of the evaluation will be completed by another provider, this initial triage assessment does not replace that evaluation, and the importance of remaining in the ED until their evaluation is complete.    [name], FNP 04/06/22 1857

## 2022-04-06 NOTE — ED Triage Notes (Signed)
Pt reports that he was here last week and seen for swelling to BLE, was given a few lasix pills but has ran out. Pt has CHF and states that he can not afford meds. Pt is homeless.

## 2022-04-07 MED ORDER — FUROSEMIDE 80 MG PO TABS: 80.0000 mg | ORAL_TABLET | Freq: Every day | ORAL | 0 refills | Status: DC

## 2022-04-07 NOTE — ED Notes (Signed)
E-signature pad unavailable - Pt verbalized understanding of D/C information - no additional concerns at this time.  

## 2022-04-07 NOTE — Discharge Instructions (Signed)
Please fill your prescription either at a pharmacy of your choice, or go to the medication assistance pharmacy (also the employee pharmacy) affiliated with [facility] in the [facility] [place] building (on the [facility]).  Please follow-up with Darylene Price at the heart failure clinic at the next available opportunity.  You should also follow-up with your primary care doctor at the next available opportunity.

## 2022-04-07 NOTE — ED Provider Notes (Signed)
[facility] Provider Note    Event Date/Time   First MD Initiated Contact with Patient 04/06/22 2312     (approximate)   History   Leg Swelling   HPI  [name] is a 71 y.o. male with a history that includes CHF, COPD, chronic lower extremity edema, and homelessness as a complicating social factor.  He presents tonight because he is out of his furosemide.  He just ran out.  He originally stated that he was given a few Lasix pills last week and he has run out, but he further clarified that he had some left from a prior prescription and has just run out.  He originally told the triage nurse that he cannot afford his meds, but he told me that he goes to [facility] nearby and he just does not have a prescription.  His breathing is at baseline.  His legs are always swollen.  He has no chest pain or abdominal pain.  He is sleeping comfortably when I assessed him.  He has not had a recent fever.  He always has a bit of a cough.  He said he took all of his medications for hepatitis C and that issue "is doing better".     Physical Exam   Triage Vital Signs: ED Triage Vitals  Enc Vitals Group     BP 04/06/22 1707 (!) 147/71     Pulse Rate 04/06/22 1707 70     Resp 04/06/22 1707 18     Temp 04/06/22 1707 99.4 F (37.4 C)     Temp Source 04/06/22 1707 Oral     SpO2 04/06/22 1707 96 %     Weight 04/06/22 1708 125 kg (275 lb 9.2 oz)     Height 04/06/22 1708 1.676 m ('5\' 6"'$ )     Head Circumference --      Peak Flow --      Pain Score 04/06/22 1708 8     Pain Loc --      Pain Edu? --      Excl. in [place]? --     Most recent vital signs: Vitals:   04/07/22 0000 04/07/22 0130  BP: 131/60 112/67  Pulse: 60 60  Resp: 18 18  Temp:    SpO2: 100% 100%     General: Sleeping initially, now awake and alert.  Appears chronically ill but in no distress. CV:  Good peripheral perfusion.  Normal heart sounds. Resp:  Normal effort.  Lungs are clear to auscultation.   Speaking in full sentences, no difficulty with respirations.  No accessory muscle usage. Abd:  No distention.  No tenderness to palpation. Other:  Bilateral 1+ pitting edema with some erythema that appears to be chronic skin changes.  No evidence of active infection.   ED Results / Procedures / Treatments   Labs (all labs ordered are listed, but only abnormal results are displayed) Labs Reviewed  BASIC METABOLIC PANEL - Abnormal; Notable for the following components:      Result Value   Glucose, Bld 101 (*)    All other components within normal limits  CBC - Abnormal; Notable for the following components:   RBC 3.59 (*)    Hemoglobin 10.8 (*)    HCT 33.5 (*)    Platelets 109 (*)    All other components within normal limits     RADIOLOGY I viewed and interpreted the patient's two-view chest x-ray.  It actually looks improved from prior.  Radiology  report concurs that there is improved aeration from the previous chest x-ray.    PROCEDURES:  Critical Care performed: No  Procedures   MEDICATIONS ORDERED IN ED: Medications - No data to display   IMPRESSION / MDM / [place] / ED COURSE  I reviewed the triage vital signs and the nursing notes.                              Differential diagnosis includes, but is not limited to, medication refill, CHF exacerbation, DVT.  Patient's presentation is most consistent with exacerbation of chronic illness.  Patient has no specific complaints or concerns at this time except being out of his medication.  [place] it is certainly possible that he could have an acute, life-threatening illness or exacerbation of chronic illness, and he is comfortable at this time.  Labs/studies ordered include two-view chest x-ray, CBC, and basic metabolic panel.  All of the results are generally reassuring and shows some improvement from prior.  He is in no respiratory distress with improved aeration on the chest x-ray as previously  described.  Vital signs are all stable and within normal limits.  He is not having chest pain or abdominal pain.  I looked in the medication reconciliation section and looked under the dispensing information.  He last had a 90-day supply of furosemide dispensed on May 10, which was exactly 3 months ago.  I clarified with him and he said that the only medication he needs is the furosemide.  I will refill it and give him a new paper prescription.  I told him about the medication assistance pharmacy in the grand [place] building, and told him he can go there or to his usual pharmacy.  He states that he understands and agrees with the plan.  I also gave him a referral back to [facility] at the heart failure clinic.  I gave my usual return precautions.       FINAL CLINICAL IMPRESSION(S) / ED DIAGNOSES   Final diagnoses:  Medication refill     Rx / DC Orders   ED Discharge Orders          Ordered    furosemide (LASIX) 80 MG tablet  Daily        04/07/22 0148    AMB referral to CHF clinic        04/07/22 0148             Note:  This document was prepared using Dragon voice recognition software and may include unintentional dictation errors.   Hinda Kehr, MD 04/07/22 [phone]

## 2022-04-08 DIAGNOSIS — Z5900 Homelessness unspecified: Secondary | ICD-10-CM | POA: Insufficient documentation

## 2022-04-08 DIAGNOSIS — I110 Hypertensive heart disease with heart failure: Secondary | ICD-10-CM | POA: Diagnosis not present

## 2022-04-08 DIAGNOSIS — R600 Localized edema: Secondary | ICD-10-CM | POA: Diagnosis not present

## 2022-04-08 DIAGNOSIS — J449 Chronic obstructive pulmonary disease, unspecified: Secondary | ICD-10-CM | POA: Insufficient documentation

## 2022-04-08 DIAGNOSIS — I509 Heart failure, unspecified: Secondary | ICD-10-CM | POA: Insufficient documentation

## 2022-04-08 DIAGNOSIS — M7989 Other specified soft tissue disorders: Secondary | ICD-10-CM | POA: Diagnosis present
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
## 2022-04-09 ENCOUNTER — Emergency Department
Admission: EM | Admit: 2022-04-09 | Discharge: 2022-04-09 | Disposition: A | Discharge status: Home / Self Care | Payer: Medicare Other | Attending: Emergency Medicine | Admitting: Emergency Medicine

## 2022-04-09 ENCOUNTER — Other Ambulatory Visit: Payer: Self-pay

## 2022-04-09 DIAGNOSIS — R600 Localized edema: Secondary | ICD-10-CM | POA: Diagnosis not present

## 2022-04-09 DIAGNOSIS — R609 Edema, unspecified: Secondary | ICD-10-CM

## 2022-04-09 LAB — CBC WITH DIFFERENTIAL/PLATELET
Abs Immature Granulocytes: 0 10*3/uL (ref 0.00–0.07)
Basophils Absolute: 0.1 10*3/uL (ref 0.0–0.1)
Basophils Relative: 2 %
Eosinophils Absolute: 0.1 10*3/uL (ref 0.0–0.5)
Eosinophils Relative: 3 %
HCT: 33 % — ABNORMAL LOW (ref 39.0–52.0)
Hemoglobin: 10.5 g/dL — ABNORMAL LOW (ref 13.0–17.0)
Immature Granulocytes: 0 %
Lymphocytes Relative: 15 %
Lymphs Abs: 0.5 10*3/uL — ABNORMAL LOW (ref 0.7–4.0)
MCH: 29.7 pg (ref 26.0–34.0)
MCHC: 31.8 g/dL (ref 30.0–36.0)
MCV: 93.2 fL (ref 80.0–100.0)
Monocytes Absolute: 0.5 10*3/uL (ref 0.1–1.0)
Monocytes Relative: 14 %
Neutro Abs: 2.2 10*3/uL (ref 1.7–7.7)
Neutrophils Relative %: 66 %
Platelets: 119 10*3/uL — ABNORMAL LOW (ref 150–400)
RBC: 3.54 MIL/uL — ABNORMAL LOW (ref 4.22–5.81)
RDW: 15.4 % (ref 11.5–15.5)
WBC: 3.3 10*3/uL — ABNORMAL LOW (ref 4.0–10.5)
nRBC: 0 % (ref 0.0–0.2)

## 2022-04-09 LAB — COMPREHENSIVE METABOLIC PANEL
ALT: 36 U/L (ref 0–44)
AST: 80 U/L — ABNORMAL HIGH (ref 15–41)
Albumin: 2.7 g/dL — ABNORMAL LOW (ref 3.5–5.0)
Alkaline Phosphatase: 80 U/L (ref 38–126)
Anion gap: 6 (ref 5–15)
BUN: 10 mg/dL (ref 8–23)
CO2: 29 mmol/L (ref 22–32)
Calcium: 10.6 mg/dL — ABNORMAL HIGH (ref 8.9–10.3)
Chloride: 105 mmol/L (ref 98–111)
Creatinine, Ser: 1.17 mg/dL (ref 0.61–1.24)
GFR, Estimated: >60 mL/min (ref >60)
Glucose, Bld: 128 mg/dL — ABNORMAL HIGH (ref 70–99)
Potassium: 3.5 mmol/L (ref 3.5–5.1)
Sodium: 140 mmol/L (ref 135–145)
Total Bilirubin: 1.9 mg/dL — ABNORMAL HIGH (ref 0.3–1.2)
Total Protein: 6.8 g/dL (ref 6.5–8.1)

## 2022-04-09 MED ORDER — FUROSEMIDE 80 MG PO TABS: 80.0000 mg | ORAL_TABLET | Freq: Every day | ORAL | 0 refills | Status: DC
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
## 2022-04-09 NOTE — Discharge Instructions (Addendum)
Please fill and begin taking your prescribed Lasix/furosemide today 04/09/2022.  Return to the emergency department for any trouble breathing chest pain or any other symptom personally concerning to yourself.

## 2022-04-10 ENCOUNTER — Telehealth: Payer: Self-pay | Admitting: Family

## 2022-04-10 NOTE — Telephone Encounter (Signed)
LVM with patient in attempt to schedule him for a follow up after his recent ED visit.   Merian Wroe, NT

## 2022-04-19 ENCOUNTER — Emergency Department
Admission: EM | Admit: 2022-04-19 | Discharge: 2022-04-20 | Disposition: A | Discharge status: Home / Self Care | Payer: Medicare Other | Attending: Emergency Medicine | Admitting: Emergency Medicine

## 2022-04-19 ENCOUNTER — Emergency Department: Payer: Medicare Other

## 2022-04-19 ENCOUNTER — Other Ambulatory Visit: Payer: Self-pay

## 2022-04-19 DIAGNOSIS — W19XXXA Unspecified fall, initial encounter: Secondary | ICD-10-CM

## 2022-04-19 DIAGNOSIS — Z5900 Homelessness unspecified: Secondary | ICD-10-CM | POA: Diagnosis not present

## 2022-04-19 DIAGNOSIS — R609 Edema, unspecified: Secondary | ICD-10-CM

## 2022-04-19 DIAGNOSIS — W1839XA Other fall on same level, initial encounter: Secondary | ICD-10-CM | POA: Insufficient documentation

## 2022-04-19 DIAGNOSIS — S8992XA Unspecified injury of left lower leg, initial encounter: Secondary | ICD-10-CM | POA: Diagnosis present

## 2022-04-19 DIAGNOSIS — T07XXXA Unspecified multiple injuries, initial encounter: Secondary | ICD-10-CM

## 2022-04-19 DIAGNOSIS — S8002XA Contusion of left knee, initial encounter: Secondary | ICD-10-CM | POA: Insufficient documentation

## 2022-04-19 DIAGNOSIS — Y9301 Activity, walking, marching and hiking: Secondary | ICD-10-CM | POA: Diagnosis not present

## 2022-04-19 DIAGNOSIS — M25511 Pain in right shoulder: Secondary | ICD-10-CM | POA: Insufficient documentation

## 2022-04-19 DIAGNOSIS — R600 Localized edema: Secondary | ICD-10-CM | POA: Diagnosis not present

## 2022-04-19 LAB — CBC WITH DIFFERENTIAL/PLATELET
Abs Immature Granulocytes: 0.02 10*3/uL (ref 0.00–0.07)
Basophils Absolute: 0 10*3/uL (ref 0.0–0.1)
Basophils Relative: 1 %
Eosinophils Absolute: 0.1 10*3/uL (ref 0.0–0.5)
Eosinophils Relative: 4 %
HCT: 33.6 % — ABNORMAL LOW (ref 39.0–52.0)
Hemoglobin: 10.7 g/dL — ABNORMAL LOW (ref 13.0–17.0)
Immature Granulocytes: 1 %
Lymphocytes Relative: 26 %
Lymphs Abs: 1 10*3/uL (ref 0.7–4.0)
MCH: 29.2 pg (ref 26.0–34.0)
MCHC: 31.8 g/dL (ref 30.0–36.0)
MCV: 91.8 fL (ref 80.0–100.0)
Monocytes Absolute: 0.8 10*3/uL (ref 0.1–1.0)
Monocytes Relative: 22 %
Neutro Abs: 1.8 10*3/uL (ref 1.7–7.7)
Neutrophils Relative %: 46 %
Platelets: 127 10*3/uL — ABNORMAL LOW (ref 150–400)
RBC: 3.66 MIL/uL — ABNORMAL LOW (ref 4.22–5.81)
RDW: 16.3 % — ABNORMAL HIGH (ref 11.5–15.5)
WBC: 3.8 10*3/uL — ABNORMAL LOW (ref 4.0–10.5)
nRBC: 0 % (ref 0.0–0.2)

## 2022-04-19 LAB — URINALYSIS, ROUTINE W REFLEX MICROSCOPIC
Bacteria, UA: NONE SEEN
Glucose, UA: NEGATIVE mg/dL
Hgb urine dipstick: NEGATIVE
Ketones, ur: NEGATIVE mg/dL
Leukocytes,Ua: NEGATIVE
Nitrite: NEGATIVE
Protein, ur: 100 mg/dL — AB
Specific Gravity, Urine: 1.026 (ref 1.005–1.030)
pH: 6 (ref 5.0–8.0)

## 2022-04-19 LAB — COMPREHENSIVE METABOLIC PANEL
ALT: 34 U/L (ref 0–44)
AST: 57 U/L — ABNORMAL HIGH (ref 15–41)
Albumin: 2.5 g/dL — ABNORMAL LOW (ref 3.5–5.0)
Alkaline Phosphatase: 68 U/L (ref 38–126)
Anion gap: 7 (ref 5–15)
BUN: 12 mg/dL (ref 8–23)
CO2: 30 mmol/L (ref 22–32)
Calcium: 9.8 mg/dL (ref 8.9–10.3)
Chloride: 104 mmol/L (ref 98–111)
Creatinine, Ser: 1.07 mg/dL (ref 0.61–1.24)
GFR, Estimated: >60 mL/min (ref >60)
Glucose, Bld: 100 mg/dL — ABNORMAL HIGH (ref 70–99)
Potassium: 3.5 mmol/L (ref 3.5–5.1)
Sodium: 141 mmol/L (ref 135–145)
Total Bilirubin: 2.6 mg/dL — ABNORMAL HIGH (ref 0.3–1.2)
Total Protein: 7 g/dL (ref 6.5–8.1)

## 2022-04-19 NOTE — ED Triage Notes (Addendum)
Pt presents to ER via ems with c/o mechanical fall while walking and stepping down and uneven surface.  Pt states his left leg gave out and he face planted.  Pt states he has pain to left knee, right shoulder and left side of face.  Pt denies using blood thinners, denies LOC.  Pt does endorse some increased urinary frequency, lower back pain, and burning with urination.  Pt is A&O x4 at this time in NAD in triage.

## 2022-04-20 NOTE — ED Notes (Signed)
E-signature pad unavailable - Pt verbalized understanding of D/C information - no additional concerns at this time.  

## 2022-04-20 NOTE — ED Provider Notes (Signed)
[facility] Provider Note    Event Date/Time   First MD Initiated Contact with Patient 04/19/22 2358     (approximate)   History   Fall   HPI  [name] is a 71 y.o. male  who is currently homeless. He arrives by EMS for evaluation after a fall.  Reports his left leg just gave out and he collapsed, scraping his face, and resulting in pain in his left knee and right shoulder.  No headache nor neck pain.  No chest pain or shortness of breath.  Chronic leg swelling and redness.  Denies drug and alcohol use.  Denies loss of consciousness.      Physical Exam   Triage Vital Signs: ED Triage Vitals [04/19/22 2124]  Enc Vitals Group     BP (!) 143/66     Pulse Rate 68     Resp 18     Temp 98.6 F (37 C)     Temp Source Oral     SpO2 95 %     Weight      Height      Head Circumference      Peak Flow      Pain Score 6     Pain Loc      Pain Edu?      Excl. in [place]?     Most recent vital signs: Vitals:   04/19/22 2124 04/20/22 0043  BP: (!) 143/66 138/70  Pulse: 68 70  Resp: 18 18  Temp: 98.6 F (37 C) 98.3 F (36.8 C)  SpO2: 95% 96%     General: Awake, no distress.  Disheveled and unkempt but polite and cooperative, mood and affect normal under the circumstances. CV:  Good peripheral perfusion.  Resp:  Normal effort.  Abd:  No distention.  Other:  Patient has chronic 1+ pitting edema and chronic skin thickening and erythema in bilateral lower extremities but no evidence of acute cellulitis or findings suggestive of DVT.  He has some tenderness to manipulation of the left knee but essentially normal range of motion and is able to bear weight without difficulty.  He has no pain or tenderness associated with his shoulders and normal range of motion both active and passive of both shoulders.  No tenderness to palpation of the cervical spine.   ED Results / Procedures / Treatments   Labs (all labs ordered are listed, but only abnormal  results are displayed) Labs Reviewed  COMPREHENSIVE METABOLIC PANEL - Abnormal; Notable for the following components:      Result Value   Glucose, Bld 100 (*)    Albumin 2.5 (*)    AST 57 (*)    Total Bilirubin 2.6 (*)    All other components within normal limits  URINALYSIS, ROUTINE W REFLEX MICROSCOPIC - Abnormal; Notable for the following components:   Color, Urine AMBER (*)    APPearance CLEAR (*)    Bilirubin Urine SMALL (*)    Protein, ur 100 (*)    All other components within normal limits  CBC WITH DIFFERENTIAL/PLATELET - Abnormal; Notable for the following components:   WBC 3.8 (*)    RBC 3.66 (*)    Hemoglobin 10.7 (*)    HCT 33.6 (*)    RDW 16.3 (*)    Platelets 127 (*)    All other components within normal limits     RADIOLOGY No acute abnormalities identified on x-rays of shoulder nor knees.    PROCEDURES:  Critical Care performed: No  Procedures   MEDICATIONS ORDERED IN ED: Medications - No data to display   IMPRESSION / MDM / [place] / ED COURSE  I reviewed the triage vital signs and the nursing notes.                              Differential diagnosis includes, but is not limited to, mechanical fall, syncope and collapse, cardiac arrhythmia, fracture/dislocation, head or neck injury.  Patient's presentation is most consistent with acute complicated illness / injury requiring diagnostic workup.  Although the patient is homeless which is a complicating social determinant of health, his vital signs are stable and he is in no distress.  He has been observed in the emergency department for 4 hours and has had no decompensation.  His mental status is normal.  Labs/studies ordered include CMP, CBC with differential, urinalysis, right shoulder x-ray, left knee x-rays.  Labs are essentially normal; he has a few abnormalities on the CMP such as a slightly elevated T. bili but these are chronic.  No evidence of acute infectious process.  No  fractures or dislocations on x-rays and a reassuring physical exam.  Patient has no sign of an acute or emergent medical condition.  No indication for hospitalization or additional work-up.  He is comfortable with the plan for discharge and outpatient follow-up.       FINAL CLINICAL IMPRESSION(S) / ED DIAGNOSES   Final diagnoses:  Fall, initial encounter  Multiple contusions  Peripheral edema     Rx / DC Orders   ED Discharge Orders     None        Note:  This document was prepared using Dragon voice recognition software and may include unintentional dictation errors.   Hinda Kehr, MD 04/20/22 [phone]

## 2022-04-20 NOTE — Discharge Instructions (Signed)

## 2022-04-25 ENCOUNTER — Encounter (HOSPITAL_COMMUNITY): Payer: Self-pay

## 2022-04-25 NOTE — Progress Notes (Signed)
Have been working with him to help him find a place to live.  Gave numbers to a few places that may take him in.  One place will let him stay during the day only but will provide what he needs such as food, etc.  He is aware of the place in downtown [place].  His resources are limited due to charges against him and he is aware.  He is checking with boarding homes around town.  He states has money to pay rent just needs to find a place that will accept him.  He states has everything he needs right now, he has money for food.  He states just getting where he cant get around as well, he is using a walker.  Did advised him that [facility] may have more resources to help him but he advised not able to leave the county, he has meetings that he has to be at due to charges. Will keep trying to help him.    [place] [phone]

## 2022-04-26 ENCOUNTER — Encounter: Payer: Self-pay | Admitting: Family

## 2022-04-26 ENCOUNTER — Encounter: Payer: Self-pay | Admitting: Emergency Medicine

## 2022-04-26 ENCOUNTER — Other Ambulatory Visit: Payer: Self-pay

## 2022-04-26 ENCOUNTER — Ambulatory Visit (HOSPITAL_BASED_OUTPATIENT_CLINIC_OR_DEPARTMENT_OTHER): Payer: Medicare Other | Admitting: Family

## 2022-04-26 ENCOUNTER — Emergency Department
Admission: EM | Admit: 2022-04-26 | Discharge: 2022-04-26 | Disposition: A | Discharge status: Home / Self Care | Payer: Medicare Other | Attending: Emergency Medicine | Admitting: Emergency Medicine

## 2022-04-26 ENCOUNTER — Emergency Department: Payer: Medicare Other

## 2022-04-26 VITALS — BP 118/100 | HR 58 | Resp 16 | Ht 66.0 in | Wt 270.0 lb

## 2022-04-26 DIAGNOSIS — I5032 Chronic diastolic (congestive) heart failure: Secondary | ICD-10-CM

## 2022-04-26 DIAGNOSIS — Z20822 Contact with and (suspected) exposure to covid-19: Secondary | ICD-10-CM | POA: Diagnosis not present

## 2022-04-26 DIAGNOSIS — R0602 Shortness of breath: Secondary | ICD-10-CM | POA: Insufficient documentation

## 2022-04-26 DIAGNOSIS — R0789 Other chest pain: Secondary | ICD-10-CM | POA: Insufficient documentation

## 2022-04-26 DIAGNOSIS — Z5900 Homelessness unspecified: Secondary | ICD-10-CM | POA: Diagnosis not present

## 2022-04-26 DIAGNOSIS — R079 Chest pain, unspecified: Secondary | ICD-10-CM

## 2022-04-26 DIAGNOSIS — I10 Essential (primary) hypertension: Secondary | ICD-10-CM | POA: Diagnosis not present

## 2022-04-26 DIAGNOSIS — D72829 Elevated white blood cell count, unspecified: Secondary | ICD-10-CM | POA: Insufficient documentation

## 2022-04-26 DIAGNOSIS — E119 Type 2 diabetes mellitus without complications: Secondary | ICD-10-CM | POA: Diagnosis not present

## 2022-04-26 LAB — BASIC METABOLIC PANEL
Anion gap: 5 (ref 5–15)
BUN: 13 mg/dL (ref 8–23)
CO2: 25 mmol/L (ref 22–32)
Calcium: 9.1 mg/dL (ref 8.9–10.3)
Chloride: 109 mmol/L (ref 98–111)
Creatinine, Ser: 0.92 mg/dL (ref 0.61–1.24)
GFR, Estimated: >60 mL/min (ref >60)
Glucose, Bld: 105 mg/dL — ABNORMAL HIGH (ref 70–99)
Potassium: 4.4 mmol/L (ref 3.5–5.1)
Sodium: 139 mmol/L (ref 135–145)

## 2022-04-26 LAB — CBC
HCT: 33.4 % — ABNORMAL LOW (ref 39.0–52.0)
Hemoglobin: 10.8 g/dL — ABNORMAL LOW (ref 13.0–17.0)
MCH: 29.7 pg (ref 26.0–34.0)
MCHC: 32.3 g/dL (ref 30.0–36.0)
MCV: 91.8 fL (ref 80.0–100.0)
Platelets: 112 10*3/uL — ABNORMAL LOW (ref 150–400)
RBC: 3.64 MIL/uL — ABNORMAL LOW (ref 4.22–5.81)
RDW: 16.7 % — ABNORMAL HIGH (ref 11.5–15.5)
WBC: 3.9 10*3/uL — ABNORMAL LOW (ref 4.0–10.5)
nRBC: 0 % (ref 0.0–0.2)

## 2022-04-26 LAB — TROPONIN I (HIGH SENSITIVITY)
Troponin I (High Sensitivity): 16 ng/L (ref <18)
Troponin I (High Sensitivity): 15 ng/L (ref <18)

## 2022-04-26 LAB — RESP PANEL BY RT-PCR (FLU A&B, COVID) ARPGX2
Influenza A by PCR: NEGATIVE
Influenza B by PCR: NEGATIVE
SARS Coronavirus 2 by RT PCR: NEGATIVE

## 2022-04-26 LAB — BRAIN NATRIURETIC PEPTIDE: B Natriuretic Peptide: 446.2 pg/mL — ABNORMAL HIGH (ref 0.0–100.0)

## 2022-04-26 MED ORDER — JARDIANCE 10 MG PO TABS: 10.0000 mg | ORAL_TABLET | Freq: Every day | ORAL | 5 refills | Status: DC

## 2022-04-26 MED ORDER — ASPIRIN 81 MG PO TBEC
81.0000 mg | DELAYED_RELEASE_TABLET | Freq: Every day | ORAL | 5 refills | Status: DC
Start: 2022-04-26 — End: 2022-10-04

## 2022-04-26 MED ORDER — PAROXETINE HCL 10 MG PO TABS: 10.0000 mg | ORAL_TABLET | Freq: Every day | ORAL | 5 refills | Status: DC

## 2022-04-26 MED ORDER — LISINOPRIL 40 MG PO TABS
40.0000 mg | ORAL_TABLET | Freq: Every day | ORAL | 5 refills | Status: DC
Start: 2022-04-26 — End: 2022-10-04

## 2022-04-26 MED ORDER — METFORMIN HCL ER 500 MG PO TB24: 500.0000 mg | ORAL_TABLET | Freq: Every day | ORAL | 5 refills | Status: DC

## 2022-04-26 MED ORDER — ATORVASTATIN CALCIUM 40 MG PO TABS: 40.0000 mg | ORAL_TABLET | Freq: Every day | ORAL | 5 refills | Status: DC

## 2022-04-26 MED ORDER — FUROSEMIDE 80 MG PO TABS: 80.0000 mg | ORAL_TABLET | Freq: Every day | ORAL | 5 refills | Status: DC

## 2022-04-26 MED ORDER — ALBUTEROL SULFATE HFA 108 (90 BASE) MCG/ACT IN AERS
2.0000 | INHALATION_SPRAY | Freq: Four times a day (QID) | RESPIRATORY_TRACT | 5 refills | Status: DC | PRN
Start: 2022-04-26 — End: 2022-10-04

## 2022-04-26 MED ORDER — NITROGLYCERIN 0.4 MG SL SUBL: 0.4000 mg | SUBLINGUAL_TABLET | SUBLINGUAL | 5 refills | Status: DC | PRN
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
## 2022-04-26 NOTE — ED Provider Notes (Signed)
[facility] Provider Note    Event Date/Time   First MD Initiated Contact with Patient 04/26/22 [phone]     (approximate)   History   Chest Pain   HPI  [name] is a 71 y.o. male  who presents to the emergency department today because of concerns for some wheezing, cough, body aches.  Symptoms started last night.  The patient denies any known sick contacts but does ride the bus.  The patient has not had any measured fevers fevers but has felt hot and cold.      Physical Exam   Triage Vital Signs: ED Triage Vitals  Enc Vitals Group     BP 04/26/22 0259 (!) 144/74     Pulse Rate 04/26/22 0259 64     Resp 04/26/22 0259 18     Temp 04/26/22 0259 98 F (36.7 C)     Temp Source 04/26/22 0259 Oral     SpO2 04/26/22 0259 100 %     Weight 04/26/22 0300 260 lb (117.9 kg)     Height 04/26/22 0300 '5\' 6"'$  (1.676 m)     Head Circumference --      Peak Flow --      Pain Score 04/26/22 0259 6     Pain Loc --      Pain Edu? --      Excl. in [place]? --     Most recent vital signs: Vitals:   04/26/22 0629 04/26/22 0800  BP: 133/81 (!) 155/73  Pulse: 61 66  Resp: 20 (!) 23  Temp: 97.9 F (36.6 C) 97.9 F (36.6 C)  SpO2: 100% 100%   General: Awake, alert, oriented. CV:  Good peripheral perfusion. Regular rate and rhythm. Resp:  Normal effort. Lungs clear. Abd:  No distention.   ED Results / Procedures / Treatments   Labs (all labs ordered are listed, but only abnormal results are displayed) Labs Reviewed  CBC - Abnormal; Notable for the following components:      Result Value   WBC 3.9 (*)    RBC 3.64 (*)    Hemoglobin 10.8 (*)    HCT 33.4 (*)    RDW 16.7 (*)    Platelets 112 (*)    All other components within normal limits  BASIC METABOLIC PANEL - Abnormal; Notable for the following components:   Glucose, Bld 105 (*)    All other components within normal limits  BRAIN NATRIURETIC PEPTIDE - Abnormal; Notable for the following components:    B Natriuretic Peptide 446.2 (*)    All other components within normal limits  RESP PANEL BY RT-PCR (FLU A&B, COVID) ARPGX2  TROPONIN I (HIGH SENSITIVITY)  TROPONIN I (HIGH SENSITIVITY)     EKG  I, Nance Pear, attending physician, personally viewed and interpreted this EKG  EKG Time: 0804 Rate: 59 Rhythm: sinus rhythm with PVC Axis: normal Intervals: qtc 412 QRS: narrow ST changes: no st elevation Impression: abnormal ekg    RADIOLOGY I independently interpreted and visualized the CXR. My interpretation: No pneumonia. No pneumothorax.  Radiology interpretation: IMPRESSION: No active cardiopulmonary disease.   PROCEDURES:  Critical Care performed: No  Procedures   MEDICATIONS ORDERED IN ED: Medications - No data to display   IMPRESSION / MDM / [place] / ED COURSE  I reviewed the triage vital signs and the nursing notes.  Differential diagnosis includes, but is not limited to, covid, influenza, pneumonia, acs.   Patient's presentation is most consistent with acute presentation with potential threat to life or bodily function.  Patient presented to the emergency department today because of concerns for wheezing, cough, body aches.  Blood work here without concerning elevation of troponin or leukocytosis.  COVID and flu were negative.  Chest x-ray without any pneumonia or pneumothorax.  BNP was slightly elevated.  I discussed this with the patient.  Did recommend increasing his Lasix for the next couple of days.  Patient has already established care with the heart failure clinic.  I did recommend follow-up with that clinic.  FINAL CLINICAL IMPRESSION(S) / ED DIAGNOSES   Final diagnoses:  Nonspecific chest pain     Note:  This document was prepared using Dragon voice recognition software and may include unintentional dictation errors.    Nance Pear, MD 04/26/22 1021

## 2022-04-26 NOTE — Patient Instructions (Addendum)
Continue weighing daily and call for an overnight weight gain of 3 pounds or more or a weekly weight gain of more than 5 pounds.   I sent in 9 prescriptions for you to [facility] on [address].

## 2022-04-26 NOTE — Discharge Instructions (Signed)
Please seek medical attention for any high fevers, chest pain, shortness of breath, change in behavior, persistent vomiting, bloody stool or any other new or concerning symptoms.  

## 2022-04-27 ENCOUNTER — Other Ambulatory Visit: Payer: Self-pay

## 2022-04-27 ENCOUNTER — Telehealth (HOSPITAL_COMMUNITY): Payer: Self-pay | Admitting: Licensed Clinical Social Worker

## 2022-04-27 ENCOUNTER — Emergency Department: Payer: Medicare Other

## 2022-04-27 DIAGNOSIS — Z7982 Long term (current) use of aspirin: Secondary | ICD-10-CM | POA: Insufficient documentation

## 2022-04-27 DIAGNOSIS — D696 Thrombocytopenia, unspecified: Secondary | ICD-10-CM | POA: Insufficient documentation

## 2022-04-27 DIAGNOSIS — Z79899 Other long term (current) drug therapy: Secondary | ICD-10-CM | POA: Diagnosis not present

## 2022-04-27 DIAGNOSIS — G459 Transient cerebral ischemic attack, unspecified: Secondary | ICD-10-CM | POA: Diagnosis not present

## 2022-04-27 DIAGNOSIS — I5032 Chronic diastolic (congestive) heart failure: Secondary | ICD-10-CM | POA: Diagnosis not present

## 2022-04-27 DIAGNOSIS — R531 Weakness: Secondary | ICD-10-CM | POA: Diagnosis present

## 2022-04-27 DIAGNOSIS — Z7984 Long term (current) use of oral hypoglycemic drugs: Secondary | ICD-10-CM | POA: Insufficient documentation

## 2022-04-27 DIAGNOSIS — Z8616 Personal history of COVID-19: Secondary | ICD-10-CM | POA: Insufficient documentation

## 2022-04-27 DIAGNOSIS — J449 Chronic obstructive pulmonary disease, unspecified: Secondary | ICD-10-CM | POA: Insufficient documentation

## 2022-04-27 DIAGNOSIS — E119 Type 2 diabetes mellitus without complications: Secondary | ICD-10-CM | POA: Insufficient documentation

## 2022-04-27 DIAGNOSIS — Z87891 Personal history of nicotine dependence: Secondary | ICD-10-CM | POA: Diagnosis not present

## 2022-04-27 DIAGNOSIS — I110 Hypertensive heart disease with heart failure: Secondary | ICD-10-CM | POA: Diagnosis not present

## 2022-04-27 DIAGNOSIS — J45909 Unspecified asthma, uncomplicated: Secondary | ICD-10-CM | POA: Insufficient documentation

## 2022-04-27 LAB — CBC
HCT: 32.4 % — ABNORMAL LOW (ref 39.0–52.0)
Hemoglobin: 10.5 g/dL — ABNORMAL LOW (ref 13.0–17.0)
MCH: 29.5 pg (ref 26.0–34.0)
MCHC: 32.4 g/dL (ref 30.0–36.0)
MCV: 91 fL (ref 80.0–100.0)
Platelets: 95 10*3/uL — ABNORMAL LOW (ref 150–400)
RBC: 3.56 MIL/uL — ABNORMAL LOW (ref 4.22–5.81)
RDW: 16.9 % — ABNORMAL HIGH (ref 11.5–15.5)
WBC: 3.5 10*3/uL — ABNORMAL LOW (ref 4.0–10.5)
nRBC: 0 % (ref 0.0–0.2)

## 2022-04-27 LAB — TROPONIN I (HIGH SENSITIVITY): Troponin I (High Sensitivity): 15 ng/L (ref <18)

## 2022-04-27 LAB — BASIC METABOLIC PANEL
Anion gap: 5 (ref 5–15)
BUN: 12 mg/dL (ref 8–23)
CO2: 26 mmol/L (ref 22–32)
Calcium: 9.1 mg/dL (ref 8.9–10.3)
Chloride: 108 mmol/L (ref 98–111)
Creatinine, Ser: 0.92 mg/dL (ref 0.61–1.24)
GFR, Estimated: >60 mL/min (ref >60)
Glucose, Bld: 115 mg/dL — ABNORMAL HIGH (ref 70–99)
Potassium: 3.7 mmol/L (ref 3.5–5.1)
Sodium: 139 mmol/L (ref 135–145)

## 2022-04-27 LAB — BRAIN NATRIURETIC PEPTIDE: B Natriuretic Peptide: 376.8 pg/mL — ABNORMAL HIGH (ref 0.0–100.0)

## 2022-04-27 NOTE — ED Triage Notes (Signed)
To triage via [facility] with c/o extremity pain, all extremities. BLE edema noted with weeping per EMS.  Hx of CHF

## 2022-04-27 NOTE — Progress Notes (Signed)
Heart and Vascular Care Navigation  04/27/2022  [name] 1951-08-15 [id number]  Reason for Referral: housing and basic need concerns.   Engaged with patient by telephone for initial visit for Heart and Vascular Care Coordination.                                                                                                   Assessment:  Pt reports losing housing about 1-2 months ago.  Was living in a house with other people and renting from someone who was apparently not the owner of the house- they were all kicked out and the person they were renting from disappeared.  Pt has been living at a bus stop since that time.  Reports he manages to get his basic needs met but is struggling to find housing due to sex offender record which he states he is not guilty of.  Looking for housing currently where he can rent a room or get into subsidized housing again- he is working on this on his own doesn't feel like he needs assistance as he is aware of options.  Pt gets enough food from donations.  Struggles with getting to appts but uses his insurance benefit to do so just not always reliable.  Reports the main thing he wants help with is getting winter clothing- will discuss how to get him what he needs with supervisor as he doesn't have mailing address available.                                      HRT/VAS Care Coordination     Patients Home Cardiology Office [place] [facility]   Living arrangements for the past 2 months Homeless   Lives with: Self   Patient Current Insurance Coverage Managed Medicare; Medicaid   Patient Has Concern With Paying Medical Bills No   Does Patient Have Prescription Coverage? Yes   Home Assistive Devices/Equipment Cane (specify quad or straight)   Current home services DME  cane       Social History:                                                                             [place]: Food Insecurity Present (04/27/2022)  Housing: High  Risk (04/26/2022)  Transportation Needs: Unmet Transportation Needs (04/27/2022)  Alcohol Screen: Low Risk  (04/30/2018)  Depression (PHQ2-9): High Risk (04/26/2022)  Financial Resource Strain: High Risk (04/27/2022)  Physical Activity: Sufficiently Active (04/16/2018)  Social Connections: Moderately Isolated (04/30/2018)  Stress: Stress Concern Present (04/16/2018)  Tobacco Use: Medium Risk (04/26/2022)    SDOH Interventions: Financial Resources:    No interventions available- gets retirement income reports about $500/month but not willing to share  exact amount  Food Insecurity:  Reports local churches gives him food regularly- gets food stamps but only about $25/month  Housing Insecurity:  Due to being on sex offender registry unable to go to shelter or most housing options that complete criminal record checks  Transportation:   Transportation Interventions: Intervention Not Indicated   Follow-up plan:    CSW will follow up to help get basic needs met- unable to assist further with housing at this time  Jorge Ny, [facility] Desk#: [phone] Cell#: [phone]

## 2022-04-28 ENCOUNTER — Encounter: Payer: Self-pay | Admitting: Internal Medicine

## 2022-04-28 ENCOUNTER — Observation Stay: Payer: Medicare Other

## 2022-04-28 ENCOUNTER — Observation Stay
Admission: EM | Admit: 2022-04-28 | Discharge: 2022-04-30 | Disposition: A | Discharge status: Home / Self Care | Payer: Medicare Other | Attending: Internal Medicine | Admitting: Internal Medicine

## 2022-04-28 ENCOUNTER — Emergency Department: Payer: Medicare Other

## 2022-04-28 DIAGNOSIS — Z5900 Homelessness unspecified: Secondary | ICD-10-CM

## 2022-04-28 DIAGNOSIS — I10 Essential (primary) hypertension: Secondary | ICD-10-CM | POA: Diagnosis present

## 2022-04-28 DIAGNOSIS — R55 Syncope and collapse: Secondary | ICD-10-CM | POA: Diagnosis not present

## 2022-04-28 DIAGNOSIS — Z8673 Personal history of transient ischemic attack (TIA), and cerebral infarction without residual deficits: Secondary | ICD-10-CM | POA: Diagnosis present

## 2022-04-28 DIAGNOSIS — F32A Depression, unspecified: Secondary | ICD-10-CM | POA: Diagnosis present

## 2022-04-28 DIAGNOSIS — G459 Transient cerebral ischemic attack, unspecified: Secondary | ICD-10-CM | POA: Diagnosis not present

## 2022-04-28 DIAGNOSIS — Z5902 Unsheltered homelessness: Secondary | ICD-10-CM

## 2022-04-28 DIAGNOSIS — E669 Obesity, unspecified: Secondary | ICD-10-CM | POA: Diagnosis present

## 2022-04-28 DIAGNOSIS — D696 Thrombocytopenia, unspecified: Secondary | ICD-10-CM | POA: Diagnosis present

## 2022-04-28 DIAGNOSIS — R531 Weakness: Secondary | ICD-10-CM | POA: Diagnosis not present

## 2022-04-28 DIAGNOSIS — I5032 Chronic diastolic (congestive) heart failure: Secondary | ICD-10-CM | POA: Diagnosis present

## 2022-04-28 LAB — GLUCOSE, CAPILLARY
Glucose-Capillary: 90 mg/dL (ref 70–99)
Glucose-Capillary: 128 mg/dL — ABNORMAL HIGH (ref 70–99)

## 2022-04-28 LAB — TROPONIN I (HIGH SENSITIVITY): Troponin I (High Sensitivity): 11 ng/L (ref <18)

## 2022-04-28 MED ORDER — ALBUTEROL SULFATE (2.5 MG/3ML) 0.083% IN NEBU
3.0000 mL | INHALATION_SOLUTION | RESPIRATORY_TRACT | Status: DC | PRN
Start: 2022-04-28 — End: 2022-04-30

## 2022-04-28 MED ORDER — PAROXETINE HCL 10 MG PO TABS
10.0000 mg | ORAL_TABLET | Freq: Every day | ORAL | Status: DC
  Administered 2022-04-29 – 2022-04-30 (×2): 10 mg via ORAL
  Filled 2022-04-28 (×2): qty 1

## 2022-04-28 MED ORDER — FUROSEMIDE 40 MG PO TABS
80.0000 mg | ORAL_TABLET | Freq: Every day | ORAL | Status: DC
  Administered 2022-04-29 – 2022-04-30 (×2): 80 mg via ORAL
  Filled 2022-04-28 (×2): qty 2

## 2022-04-28 MED ORDER — ACETAMINOPHEN 650 MG RE SUPP: 650.0000 mg | RECTAL | Status: DC | PRN

## 2022-04-28 MED ORDER — ASPIRIN 81 MG PO TBEC
81.0000 mg | DELAYED_RELEASE_TABLET | Freq: Every day | ORAL | Status: DC
  Administered 2022-04-29 – 2022-04-30 (×2): 81 mg via ORAL
  Filled 2022-04-28 (×2): qty 1

## 2022-04-28 MED ORDER — ALUM & MAG HYDROXIDE-SIMETH 200-200-20 MG/5ML PO SUSP
30.0000 mL | ORAL | Status: DC | PRN
Start: 2022-04-28 — End: 2022-04-30
  Administered 2022-04-28: 30 mL via ORAL

## 2022-04-28 MED ORDER — ATORVASTATIN CALCIUM 20 MG PO TABS
40.0000 mg | ORAL_TABLET | Freq: Every day | ORAL | Status: DC
  Administered 2022-04-29 – 2022-04-30 (×2): 40 mg via ORAL
  Filled 2022-04-28 (×2): qty 2

## 2022-04-28 MED ORDER — ACETAMINOPHEN 325 MG PO TABS
650.0000 mg | ORAL_TABLET | Freq: Once | ORAL | Status: AC
  Administered 2022-04-28: 650 mg via ORAL
  Filled 2022-04-28: qty 2

## 2022-04-28 MED ORDER — ACETAMINOPHEN 160 MG/5ML PO SOLN: 650.0000 mg | ORAL | Status: DC | PRN

## 2022-04-28 MED ORDER — ACETAMINOPHEN 325 MG PO TABS
650.0000 mg | ORAL_TABLET | ORAL | Status: DC | PRN
  Administered 2022-04-28 – 2022-04-30 (×4): 650 mg via ORAL
  Filled 2022-04-28 (×4): qty 2

## 2022-04-28 MED ORDER — LISINOPRIL 20 MG PO TABS
40.0000 mg | ORAL_TABLET | Freq: Every day | ORAL | Status: DC
  Administered 2022-04-29 – 2022-04-30 (×2): 40 mg via ORAL
  Filled 2022-04-28 (×2): qty 2

## 2022-04-28 MED ORDER — STROKE: EARLY STAGES OF RECOVERY BOOK: Freq: Once | Status: AC

## 2022-04-28 MED ORDER — PNEUMOCOCCAL 20-VAL CONJ VACC 0.5 ML IM SUSY
0.5000 mL | PREFILLED_SYRINGE | INTRAMUSCULAR | Status: DC
  Filled 2022-04-28 (×2): qty 0.5

## 2022-04-28 MED ORDER — NITROGLYCERIN 0.4 MG SL SUBL: 0.4000 mg | SUBLINGUAL_TABLET | SUBLINGUAL | Status: DC | PRN

## 2022-04-28 NOTE — Progress Notes (Addendum)
Occupational Therapy Evaluation Patient Details Name: [name] MRN: [id number] DOB: 11-Sep-1950 Today's Date: 04/28/2022   History of Present Illness Pt is a 71 y/o M who presented with c/o extremity pain & R sided weakness. MRI is negative for acute intracranial abnormality. PMH: CHF, COPD, HTN   Clinical Impression   Pt. presents with RUE weakness, impaired sensation, impaired [place], and 2/10 all over pain, and limited functional mobility which hinder his ability to complete ADL, and IADL tasks. Pt. Is currently experiencing homelessness. Pt. Reports that he sleeps on rocks. Pt. Reports being hopeful that he and his girlfriend can be reunited, and live together again in October, or November. Pt. Was independent with ADLs, and IADL functioning. Pt. Is independent within his room with ADL functioning.  Pt. Performed right hand strengthening with green theraputty following a HEP. Pt. could benefit from OT services for ADL training, there. Ex., neuromuscular re-ed, and pt. education about home modification, and DME. Pt. Could benefit from Outpatient OT services.      Recommendations for follow up therapy are one component of a multi-disciplinary discharge planning process, led by the attending physician.  Recommendations may be updated based on patient status, additional functional criteria and insurance authorization.   Follow Up Recommendations  Outpatient OT    Assistance Recommended at Discharge    Patient can return home with the following      Functional Status Assessment  Patient has had a recent decline in their functional status and demonstrates the ability to make significant improvements in function in a reasonable and predictable amount of time.  Equipment Recommendations       Recommendations for Other Services       Precautions / Restrictions Precautions Precautions: None Restrictions Weight Bearing Restrictions: No      Mobility Bed Mobility Overal bed mobility:  Modified Independent             General bed mobility comments: Pt. up in recliner chair upon arrival.    Transfers Overall transfer level: Independent                        Balance                                           ADL either performed or assessed with clinical judgement   ADL Overall ADL's : Needs assistance/impaired Eating/Feeding: Modified independent   Grooming: Set up;Independent           Upper Body Dressing : Set up;Independent;Standing   Lower Body Dressing: Set up;Independent   Toilet Transfer: Set up;Modified Independent                   Vision Baseline Vision/History: 0 No visual deficits Patient Visual Report: No change from baseline       Perception     Praxis      Pertinent Vitals/Pain Pain Assessment Pain Assessment: 0-10 Pain Score: 2  Faces Pain Scale: Hurts a little bit Pain Location: All over pain Pain Descriptors / Indicators: Discomfort     Hand Dominance Right (Ambidextrous)   Extremity/Trunk Assessment Upper Extremity Assessment Upper Extremity Assessment: Generalized weakness (Weakness, and Tingling in the right hand.)           Communication Communication Communication: No difficulties   Cognition Arousal/Alertness: Awake/alert   Overall Cognitive Status: Within Functional Limits  for tasks assessed                                       General Comments       Exercises Other Exercises Other Exercises: Right hand strengthening exercises with green theraputty, following a visual handout.   Shoulder Instructions      Home Living Family/patient expects to be discharged to:: Shelter/Homeless (Pt. reports that he literally sleeps on rocks.)                                 Additional Comments: Pt reports he's still homeless, sleeping on the street.      Prior Functioning/Environment Prior Level of Function : Independent/Modified  Independent             Mobility Comments: Pt reports hx of falls but mod I with SPC.          OT Problem List: Decreased strength;Impaired sensation;Decreased coordination      OT Treatment/Interventions: Self-care/ADL training;DME and/or AE instruction;Therapeutic exercise;Therapeutic activities    OT Goals(Current goals can be found in the care plan section) Acute Rehab OT Goals Patient Stated Goal: Pt. receive therapy when leaving the hospital OT Goal Formulation: With patient Time For Goal Achievement: 05/05/22 Potential to Achieve Goals: Good  OT Frequency: Min 2X/week    Co-evaluation              AM-PAC OT "6 Clicks" Daily Activity     Outcome Measure Help from another person eating meals?: None Help from another person taking care of personal grooming?: None Help from another person toileting, which includes using toliet, bedpan, or urinal?: None Help from another person bathing (including washing, rinsing, drying)?: None Help from another person to put on and taking off regular upper body clothing?: None Help from another person to put on and taking off regular lower body clothing?: None 6 Click Score: 24   End of Session    Activity Tolerance: Patient tolerated treatment well Patient left: in bed  OT Visit Diagnosis: Unsteadiness on feet (R26.81);Muscle weakness (generalized) (M62.81)                Time: 1440-1520 OT Time Calculation (min): 40 min Charges:  OT General Charges $OT Visit: 1 Visit OT Evaluation $OT Eval Low Complexity: 1 Low OT Treatments $Self Care/Home Management : 8-22 mins  Harrel Carina, MS, OTR/L   Harrel Carina 04/28/2022, 4:55 PM

## 2022-04-28 NOTE — Assessment & Plan Note (Signed)
Patient presents for evaluation of right-sided numbness concerning for possible TIA versus acute CVA. Obtain MRI of the brain without contrast to rule out an acute infarct We will request PT/OT/ST consult Will not repeat a 2D echocardiogram since he had one done in 07/23 Allow for permissive hypertension until an acute stroke is ruled out Continue aspirin and high intensity statins

## 2022-04-28 NOTE — ED Provider Notes (Signed)
[facility] Provider Note    Event Date/Time   First MD Initiated Contact with Patient 04/28/22 [phone]     (approximate)   History   Extremity Pain   HPI  [name] is a 71 y.o. male with a history of CHF, COPD, hypertension, and homelessness who presents with extremity pain and right-sided weakness.  The patient apparently initially presented reporting pain to all 4 extremities and persistent swelling.  However, he now states that he fell yesterday evening around 6 PM and has had weakness in the right side of his body since that time.  He states that his leg gave out and he suddenly fell over to the right.  He reports numbness in his right arm and leg and states that the skin feels like it is not his skin.  He reports decreased grip strength and weakness in the right leg as well.  He denies headache or speech or vision changes.  He denies any prior history of stroke or TIA.  He states that the swelling in his legs is actually improved over the last day after receiving Lasix.     Physical Exam   Triage Vital Signs: ED Triage Vitals  Enc Vitals Group     BP 04/28/22 0200 131/61     Pulse Rate 04/28/22 0200 67     Resp 04/28/22 0200 18     Temp 04/28/22 0200 98.2 F (36.8 C)     Temp Source 04/28/22 0200 Oral     SpO2 04/28/22 0200 99 %     Weight 04/27/22 2026 270 lb (122.5 kg)     Height 04/27/22 2026 '5\' 6"'$  (1.676 m)     Head Circumference --      Peak Flow --      Pain Score 04/27/22 2026 5     Pain Loc --      Pain Edu? --      Excl. in [place]? --     Most recent vital signs: Vitals:   04/28/22 0845 04/28/22 0936  BP:  (!) 150/86  Pulse: (!) 58 66  Resp: 16 17  Temp:  98.7 F (37.1 C)  SpO2: 100% 100%     General: Alert and oriented, no distress. CV:  Good peripheral perfusion.  Resp:  Normal effort.  Abd:  No distention.  Other:  EOMI.  PERRLA.  No facial droop.  Slight decreased strength and subjective numbness to right upper and  right lower extremity.  No ataxia.  2+ bilateral lower extremity edema.   ED Results / Procedures / Treatments   Labs (all labs ordered are listed, but only abnormal results are displayed) Labs Reviewed  BASIC METABOLIC PANEL - Abnormal; Notable for the following components:      Result Value   Glucose, Bld 115 (*)    All other components within normal limits  CBC - Abnormal; Notable for the following components:   WBC 3.5 (*)    RBC 3.56 (*)    Hemoglobin 10.5 (*)    HCT 32.4 (*)    RDW 16.9 (*)    Platelets 95 (*)    All other components within normal limits  BRAIN NATRIURETIC PEPTIDE - Abnormal; Notable for the following components:   B Natriuretic Peptide 376.8 (*)    All other components within normal limits  TROPONIN I (HIGH SENSITIVITY)  TROPONIN I (HIGH SENSITIVITY)     EKG  ED ECG REPORT I, Arta Silence, the attending physician, personally  viewed and interpreted this ECG.  Date: 04/28/2022 EKG Time: 2041 Rate: 59 Rhythm: normal sinus rhythm (incorrectly read by machine as junctional rhythm) QRS Axis: normal Intervals: normal ST/T Wave abnormalities: nonspecific T wave abnormalities  Narrative Interpretation: no evidence of acute ischemia    RADIOLOGY  Chest x-ray: I independently viewed and interpreted the images; there is no focal consolidation or significant edema  CT head: No ICH or other acute abnormality  PROCEDURES:  Critical Care performed: No  Procedures   MEDICATIONS ORDERED IN ED: Medications  acetaminophen (TYLENOL) tablet 650 mg (650 mg Oral Given 04/28/22 0944)     IMPRESSION / MDM / ASSESSMENT AND PLAN / ED COURSE  I reviewed the triage vital signs and the nursing notes.  71 year old male with PMH as noted above presents initially reporting extremity pain and edema, but now states to me that he in fact fell yesterday evening and has had right-sided weakness and numbness since that time.  I reviewed the past medical  records.  The patient was seen in the ED here for a fall on 825 and for cough and body aches on 8/31.  Subsequently followed up on 8/31 at the CHF clinic.  He had lost weight at that time.  Per the clinic note, he has been off of his medications recently because they have been stolen.  He was given gift cards to be able to get a new supply of all of his medications.  On exam currently, the patient has normal vital signs and is alert and oriented.  He does demonstrate mild right upper and right lower extremity weakness but no facial droop.  Differential diagnosis includes, but is not limited to, CVA, TIA, CHF exacerbation, fluid overload, metabolic disturbance.  Patient's presentation is most consistent with acute presentation with potential threat to life or bodily function.  Of note, the patient waited approximately 12 hours from presentation to be put in exam room and seen.  Lab work-up during this time is overall reassuring.  BNP is mildly elevated.  Troponin is negative.  Electrolytes are unremarkable.  Chest x-ray shows no significant edema.  We will obtain CT for stroke evaluation.  I anticipate likely admission.  The patient is on the cardiac monitor to evaluate for evidence of arrhythmia and/or significant heart rate changes.  ----------------------------------------- 10:17 AM on 04/28/2022 -----------------------------------------  CT head is negative.  I consulted Dr. Quinn Axe from neurology.  Given that the patient continues to have weakness he will need admission for further work-up.  I consulted Dr. Francine Graven from the hospitalist service; based on her discussion she agrees to admit the patient.   FINAL CLINICAL IMPRESSION(S) / ED DIAGNOSES   Final diagnoses:  Right sided weakness     Rx / DC Orders   ED Discharge Orders     None        Note:  This document was prepared using Dragon voice recognition software and may include unintentional dictation errors.    Arta Silence, MD 04/28/22 1018

## 2022-04-28 NOTE — Progress Notes (Signed)
Called [facility], unable to retreive ordered medication from pyxis. Per pharmacist will address following there completion of med reconciliation.

## 2022-04-28 NOTE — Assessment & Plan Note (Signed)
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

## 2022-04-28 NOTE — Assessment & Plan Note (Signed)
Continue Paxil.

## 2022-04-28 NOTE — Evaluation (Signed)
Physical Therapy Evaluation Patient Details Name: [name] MRN: [id number] DOB: 1950-09-13 Today's Date: 04/28/2022  History of Present Illness  Pt is a 71 y/o M who presented with c/o extremity pain & R sided weakness. MRI is negative for acute intracranial abnormality. PMH: CHF, COPD, HTN  Clinical Impression  Pt seen for PT evaluation with pt reporting he is homeless but mod I with SPC & endorses falls in the past. On this date, pt demonstrates decreased sensation to light touch & requires UE assistance to position RLE fully in figure 4 position to don socks sitting EOB but no focal deficits appreciated. Pt attempts gait without UE support but with impaired gait pattern & balance. Provided pt with [facility] & pt able to ambulate around nurses station with mod I. At this time, pt is at his baseline level of function & acute PT services are not needed at this time. Please re-consult if new needs arise.      Recommendations for follow up therapy are one component of a multi-disciplinary discharge planning process, led by the attending physician.  Recommendations may be updated based on patient status, additional functional criteria and insurance authorization.  Follow Up Recommendations No PT follow up      Assistance Recommended at Discharge PRN  Patient can return home with the following       Equipment Recommendations  (pt reports he had a SPC but unsure if it's stolen since it was left where he was picked up, reporte he can purchase one from [employer] himself)  Recommendations for Other Services       Functional Status Assessment Patient has not had a recent decline in their functional status     Precautions / Restrictions Precautions Precautions: None Restrictions Weight Bearing Restrictions: No      Mobility  Bed Mobility Overal bed mobility: Modified Independent             General bed mobility comments: supine>sit mod I, HOB elevated, bed rails PRN     Transfers Overall transfer level: Independent Equipment used: None               General transfer comment: STS without AD, with SPC    Ambulation/Gait Ambulation/Gait assistance: Modified independent (Device/Increase time) Gait Distance (Feet):  (7 ft + 170 ft) Assistive device: None, Straight cane   Gait velocity: decreased     General Gait Details: Pt attempts gait with 1UE support on furniture but when cued not to hold to anything pt with decreased weight shifting, decreased step length BLE, decreased stride length, decreased balance requiring supervision. Provided pt with [facility] & pt ambulates around nurses station with decreased gait speed but proper gait pattern & mod I.  Stairs            Wheelchair Mobility    Modified Rankin (Stroke Patients Only)       Balance Overall balance assessment: Modified Independent                                           Pertinent Vitals/Pain Pain Assessment Pain Assessment: Faces Faces Pain Scale: Hurts a little bit Pain Location: chronic neck pain Pain Descriptors / Indicators: Discomfort Pain Intervention(s): Monitored during session    Home Living Family/patient expects to be discharged to:: Shelter/Homeless  Additional Comments: Pt reports he's still homeless, sleeping on the street.    Prior Function               Mobility Comments: Pt reports hx of falls but mod I with SPC.       Hand Dominance        Extremity/Trunk Assessment   Upper Extremity Assessment Upper Extremity Assessment: Overall WFL for tasks assessed    Lower Extremity Assessment Lower Extremity Assessment: Overall WFL for tasks assessed;RLE deficits/detail RLE Deficits / Details: Pt endorses decreased sensation to light touch RLE       Communication   Communication: No difficulties  Cognition Arousal/Alertness: Awake/alert Behavior During Therapy: WFL for tasks  assessed/performed Overall Cognitive Status: Within Functional Limits for tasks assessed                                          General Comments      Exercises     Assessment/Plan    PT Assessment Patient does not need any further PT services  PT Problem List         PT Treatment Interventions      PT Goals (Current goals can be found in the Care Plan section)  Acute Rehab PT Goals Patient Stated Goal: none stated PT Goal Formulation: With patient Time For Goal Achievement: 05/12/22 Potential to Achieve Goals: Good    Frequency       Co-evaluation               AM-PAC PT "6 Clicks" Mobility  Outcome Measure Help needed turning from your back to your side while in a flat bed without using bedrails?: None Help needed moving from lying on your back to sitting on the side of a flat bed without using bedrails?: None Help needed moving to and from a bed to a chair (including a wheelchair)?: None Help needed standing up from a chair using your arms (e.g., wheelchair or bedside chair)?: None Help needed to walk in hospital room?: None Help needed climbing 3-5 steps with a railing? : None 6 Click Score: 24    End of Session   Activity Tolerance: Patient tolerated treatment well Patient left: in chair;with call bell/phone within reach Nurse Communication: Mobility status      Time: 4163-8453 PT Time Calculation (min) (ACUTE ONLY): 16 min   Charges:   PT Evaluation $PT Eval Low Complexity: 1 Low          [name], PT, DPT 04/28/22, 12:19 PM   [name] 04/28/2022, 12:17 PM

## 2022-04-28 NOTE — ED Notes (Signed)
Patient given phone for MRI tech screening.

## 2022-04-28 NOTE — Assessment & Plan Note (Signed)
TOC consult

## 2022-04-28 NOTE — H&P (Addendum)
History and Physical    Patient: [name] ONG:[id number] DOB: 14-May-1951 DOA: 04/28/2022 DOS: the patient was seen and examined on 04/28/2022 PCP: Denton Lank, MD  Patient coming from: Home  Chief Complaint:  Chief Complaint  Patient presents with   Extremity Pain   HPI: EVELIO RUEDA is a 71 y.o. male with medical history significant for COPD, hypertension, diabetes mellitus, morbid obesity (BMI 44), chronic diastolic dysfunction CHF who presented to the ER for evaluation of right-sided numbness and a fall at about 5 PM the day prior to his admission. Patient noted numbness and some weakness involving his right side which she stated happened at about 5 PM the day prior to his admission.  He states that he fell because his right leg gave out on him and required assistance to get up.  At baseline he denies the use of an assist device for ambulation.  He states that he has decreased sensation on his right side and feels his right leg is weaker than his left leg.  He denies having any headache, no slurred speech, no double vision, no blurred vision, no difficulty swallowing.  He is able to move all his extremities. He denies having any chest pain, no shortness of breath, no nausea, no abdominal pain, no changes in his bowel habits, no urinary symptoms, no dizziness, no lightheadedness, no blurred vision no focal deficit. Initial CT scan of the head is negative for an acute stroke Not a candidate for tPA due to him being outside the window Patient will be referred to observation status for further evaluation   Review of Systems: As mentioned in the history of present illness. All other systems reviewed and are negative. Past Medical History:  Diagnosis Date   Asthma    CHF (congestive heart failure) (HCC)    COPD (chronic obstructive pulmonary disease) ([place])    COVID-19 03/2020   diagnosed in August 2021   Diabetes mellitus without complication ([place])    Homelessness    Hypertension     Infestation by bed bug    Migraine    Obesity    Sleep apnea    Past Surgical History:  Procedure Laterality Date   CARDIAC CATHETERIZATION     CHOLECYSTECTOMY     EYE SURGERY     INNER EAR SURGERY     NOSE SURGERY     Social History:  reports that he has quit smoking. He has never used smokeless tobacco. He reports that he does not drink alcohol and does not use drugs.  Allergies  Allergen Reactions   Novocain [Procaine]    Sulfa Antibiotics Other (See Comments)    Family History  Problem Relation Age of Onset   Hypertension Mother    Heart disease Mother    Heart failure Maternal Grandmother     Prior to Admission medications   Medication Sig Start Date End Date Taking? Authorizing Provider  albuterol (PROVENTIL) (2.5 MG/3ML) 0.083% nebulizer solution Take 3 mLs by nebulization every 4 (four) hours as needed for wheezing or shortness of breath. Patient not taking: Reported on 04/26/2022 03/10/22 04/09/22  Vanessa Garnett, MD  albuterol (VENTOLIN HFA) 108 (90 Base) MCG/ACT inhaler Inhale 2 puffs into the lungs every 6 (six) hours as needed for wheezing or shortness of breath. 04/26/22 05/26/22  Alisa Graff, FNP  ascorbic acid (VITAMIN C) 500 MG tablet Take 1 tablet (500 mg total) by mouth daily. Patient not taking: Reported on 04/26/2022 04/20/20   Murray Hodgkins  P, MD  aspirin EC 81 MG tablet Take 1 tablet (81 mg total) by mouth daily. Swallow whole. 04/26/22   Alisa Graff, FNP  atorvastatin (LIPITOR) 40 MG tablet Take 1 tablet (40 mg total) by mouth daily. 04/26/22   Alisa Graff, FNP  buprenorphine-naloxone (SUBOXONE) 2-0.5 mg SUBL SL tablet Place 1 tablet under the tongue in the morning and at bedtime. Juanda Crumble drew Patient not taking: Reported on 04/26/2022    [provider]  cetirizine (ZYRTEC ALLERGY) 10 MG tablet Take 1 tablet (10 mg total) by mouth daily. Patient not taking: Reported on 04/26/2022 07/04/20   Blake Divine, MD  cyclobenzaprine (FLEXERIL)  5 MG tablet Take 5 mg by mouth at bedtime as needed for muscle spasms. Patient not taking: Reported on 04/26/2022    [provider]  EPINEPHrine 0.3 mg/0.3 mL IJ SOAJ injection Inject 0.3 mg into the muscle as needed for anaphylaxis. Patient not taking: Reported on 04/26/2022    [provider]  fluticasone (FLONASE) 50 MCG/ACT nasal spray Place 1 spray into both nostrils daily. Patient not taking: Reported on 04/26/2022    [provider]  furosemide (LASIX) 80 MG tablet Take 1 tablet (80 mg total) by mouth daily. 04/26/22   Alisa Graff, FNP  glucosamine-chondroitin 500-400 MG tablet Take 1 tablet by mouth 3 (three) times daily. Patient not taking: Reported on 04/26/2022    [provider]  JARDIANCE 10 MG TABS tablet Take 1 tablet (10 mg total) by mouth daily. 04/26/22   Alisa Graff, FNP  lisinopril (ZESTRIL) 40 MG tablet Take 1 tablet (40 mg total) by mouth daily. 04/26/22   Alisa Graff, FNP  metFORMIN (GLUCOPHAGE-XR) 500 MG 24 hr tablet Take 1 tablet (500 mg total) by mouth daily. 04/26/22   Alisa Graff, FNP  Multiple Vitamin (MULTIVITAMIN WITH MINERALS) TABS tablet Take 1 tablet by mouth daily. Patient not taking: Reported on 04/26/2022    [provider]  nitroGLYCERIN (NITROSTAT) 0.4 MG SL tablet Place 1 tablet (0.4 mg total) under the tongue every 5 (five) minutes x 3 doses as needed for chest pain. 04/26/22   Alisa Graff, FNP  omeprazole (PRILOSEC) 40 MG capsule Take 1 capsule (40 mg total) by mouth daily for 14 days. Patient not taking: Reported on 04/26/2022 03/10/22 03/26/22  Vanessa Remer, MD  PARoxetine (PAXIL) 10 MG tablet Take 1 tablet (10 mg total) by mouth daily. 04/26/22   Alisa Graff, FNP  sucralfate (CARAFATE) 1 GM/10ML suspension Take 10 mLs (1 g total) by mouth 4 (four) times daily. Patient not taking: Reported on 04/26/2022 12/01/21   Merlyn Lot, MD    Physical Exam: Vitals:   04/28/22 0830 04/28/22 0845  04/28/22 0936 04/28/22 1141  BP:   (!) 150/86 130/63  Pulse: 60 (!) 58 66 (!) 58  Resp: '16 16 17 16  '$ Temp:   98.7 F (37.1 C) 98 F (36.7 C)  TempSrc:   Oral Oral  SpO2: 100% 100% 100% 100%  Weight:      Height:       Physical Exam Vitals and nursing note reviewed.  Constitutional:      Comments: Appears disheveled  HENT:     Head: Normocephalic and atraumatic.     Nose: Nose normal.     Mouth/Throat:     Mouth: Mucous membranes are moist.  Eyes:     Conjunctiva/sclera: Conjunctivae normal.  Cardiovascular:     Rate and Rhythm: Bradycardia  present.  Pulmonary:     Effort: Pulmonary effort is normal.     Breath sounds: Normal breath sounds.  Abdominal:     General: Abdomen is flat. Bowel sounds are normal.  Musculoskeletal:        General: Normal range of motion.     Cervical back: Normal range of motion and neck supple.     Right lower leg: Edema present.     Left lower leg: Edema present.  Skin:    General: Skin is warm and dry.  Neurological:     Mental Status: He is alert.     Comments: Able to move all extremities     Data Reviewed: Relevant notes from primary care and specialist visits, past discharge summaries as available in EHR, including Care Everywhere. Prior diagnostic testing as pertinent to current admission diagnoses Updated medications and problem lists for reconciliation ED course, including vitals, labs, imaging, treatment and response to treatment Triage notes, nursing and pharmacy notes and ED provider's notes Notable results as noted in HPI Labs reviewed.  Sodium 139, potassium 3.7, chloride 108, bicarb 26, glucose 115, BUN 12, creatinine 0.92, calcium 9.1, troponin 15, BNP 376, white count 3.5, hemoglobin 10.5, hematocrit 32.4, platelet count 95 Chest x-ray reviewed by me shows no evidence of acute cardiopulmonary disease CT scan of the head without contrast shows Stable.  No acute intracranial abnormality. MRI of the brain without contrast No  acute intracranial abnormality. Stable noncontrast MRI appearance of the brain since last year, remarkable for cirrhosis related basal ganglia signal changes. MRI of the cervical spine shows bulky chronic disc and endplate degeneration, also demonstrated by CT in July. Subsequent cervical spinal stenosis from C3-C4 through C5-C6 could be MODERATE, along with spinal cord mass effect. But spinal cord signal is poorly evaluated on this exam. 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 Twelve-lead EKG reviewed by me shows junctional rhythm with nonspecific T wave changes There are no new results to review at this time.  Assessment and Plan: * TIA (transient ischemic attack) Patient presents for evaluation of right-sided numbness concerning for possible TIA versus acute CVA. 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 Monitor closely for bleeding  Depression Continue Paxil  Chronic diastolic CHF (congestive heart failure) ([place]) Stable and not acutely exacerbated Patient has an LVEF of 60 to 65% from a 2D echocardiogram which was done 07/23 Continue furosemide and lisinopril  Obesity BMI 29.5 Complicates overall prognosis and care Lifestyle modification and exercise has been discussed with patient in detail  Obesity, Class III, BMI 40-49.9 (morbid obesity) (HCC) BMI 62.1 Complicates overall prognosis and care Lifestyle modification and exercise discussed with patient in detail  HTN (hypertension) Blood pressure is stable Continue lisinopril  Homelessness TOC consult      Advance Care Planning:   Code Status: Full Code   Consults:  PT/OT/ST, TOC  Family Communication: Greater than 50% of time was spent discussing patient's condition and plan of care with him at the bedside.  All questions and concerns have been addressed.  He verbalizes understanding and agrees with the plan.  Severity of Illness: The appropriate patient status for this patient is OBSERVATION. Observation status is  judged to be reasonable and necessary in order to provide the required intensity of service to ensure the patient's safety. The patient's presenting symptoms, physical exam findings, and initial radiographic and laboratory data in the context of their medical condition is felt to place them at decreased risk for further clinical deterioration. Furthermore, it is anticipated that the patient will be medically stable for discharge from the hospital within 2 midnights of admission.   Author: Collier Bullock, MD 04/28/2022 12:33 PM  For on call review [email or web address].

## 2022-04-28 NOTE — Assessment & Plan Note (Signed)
BMI 00.7 Complicates overall prognosis and care Lifestyle modification and exercise discussed with patient in detail

## 2022-04-28 NOTE — ED Notes (Signed)
Patient transported to CT 

## 2022-04-28 NOTE — Assessment & Plan Note (Signed)
BMI 32.0 Complicates overall prognosis and care Lifestyle modification and exercise has been discussed with patient in detail

## 2022-04-28 NOTE — Assessment & Plan Note (Signed)
Stable and not acutely exacerbated Patient has an LVEF of 60 to 65% from a 2D echocardiogram which was done 07/23 Continue furosemide and lisinopril

## 2022-04-28 NOTE — Progress Notes (Signed)
SLP Cancellation Note  Patient Details Name: [name] MRN: [id number] DOB: 08/17/1951   Cancelled treatment:       Reason Eval/Treat Not Completed: Patient at procedure or test/unavailable   Avleen Bordwell 04/28/2022, 11:19 AM

## 2022-04-28 NOTE — Assessment & Plan Note (Signed)
-   Blood pressure is stable °-Continue lisinopril °

## 2022-04-29 DIAGNOSIS — F32A Depression, unspecified: Secondary | ICD-10-CM

## 2022-04-29 DIAGNOSIS — G459 Transient cerebral ischemic attack, unspecified: Secondary | ICD-10-CM | POA: Diagnosis not present

## 2022-04-29 DIAGNOSIS — R531 Weakness: Secondary | ICD-10-CM

## 2022-04-29 DIAGNOSIS — Z5900 Homelessness unspecified: Secondary | ICD-10-CM

## 2022-04-29 DIAGNOSIS — R55 Syncope and collapse: Secondary | ICD-10-CM

## 2022-04-29 LAB — LIPID PANEL
Cholesterol: 92 mg/dL (ref 0–200)
HDL: 25 mg/dL — ABNORMAL LOW (ref >40)
LDL Cholesterol: 57 mg/dL (ref 0–99)
Total CHOL/HDL Ratio: 3.7 RATIO
Triglycerides: 51 mg/dL (ref <150)
VLDL: 10 mg/dL (ref 0–40)

## 2022-04-29 LAB — GLUCOSE, CAPILLARY
Glucose-Capillary: 87 mg/dL (ref 70–99)
Glucose-Capillary: 88 mg/dL (ref 70–99)
Glucose-Capillary: 123 mg/dL — ABNORMAL HIGH (ref 70–99)
Glucose-Capillary: 131 mg/dL — ABNORMAL HIGH (ref 70–99)

## 2022-04-29 LAB — AMMONIA: Ammonia: 70 umol/L — ABNORMAL HIGH (ref 9–35)

## 2022-04-29 MED ORDER — LACTULOSE 10 GM/15ML PO SOLN
20.0000 g | Freq: Two times a day (BID) | ORAL | Status: DC
Start: 2022-04-29 — End: 2022-04-29
  Administered 2022-04-29: 20 g via ORAL
  Filled 2022-04-29: qty 30

## 2022-04-29 NOTE — Consult Note (Signed)
NEUROLOGY CONSULTATION NOTE   Date of service: April 29, 2022 Patient Name: [name] MRN:  [id number] DOB:  [birth year] Reason for consult: ?R sided weakness of unknown chronicity Requesting physician: Dr. Francine Graven _ _ _   _ __   _ __ _ _  __ __   _ __   __ _  History of Present Illness   This is a 71 yo man currently homeless with hx CHF, DM2, HTN, OSA who presented to ED after an episode of syncope (3rd this month). Admitting physician noted generalized weakness, slightly worse on R and consulted neurology 2/2 c/f possible ischemic event.  MRI brain and c spine show nothing acute (personal review). PT says he is at baseline with no new deficits. On examination patient has significant functional contribution / poor effort / giveway weakness but does seem to be slightly weaker on the R. He states this has been present for a long time, at least months, and confirms he has no new neurologic deficits.  His chief complaint to me was recurrent syncope, no prodrome, falls w/ LOC lasting seconds then wakes up on the floor, no post-event confusion/lethargy/incontinence. He has presented 3x for same in past mo to ED. TTE 03/25/22 was WNL and showed no e/o AS. Inpatient telemetry thus far has been unremarkable. He has not had ambulatory cardiac monitoring.   ROS   Per HPI: all other systems reviewed and are negative  Past History   I have reviewed the following:  Past Medical History:  Diagnosis Date   Asthma    CHF (congestive heart failure) (HCC)    COPD (chronic obstructive pulmonary disease) ([place])    COVID-19 03/2020   diagnosed in August 2021   Diabetes mellitus without complication ([place])    Homelessness    Hypertension    Infestation by bed bug    Migraine    Obesity    Sleep apnea    Past Surgical History:  Procedure Laterality Date   CARDIAC CATHETERIZATION     CHOLECYSTECTOMY     EYE SURGERY     INNER EAR SURGERY     NOSE SURGERY     Family History  Problem  Relation Age of Onset   Hypertension Mother    Heart disease Mother    Heart failure Maternal Grandmother    Social History   Socioeconomic History   Marital status: Single    Spouse name: Not on file   Number of children: 2   Years of education: 12   Highest education level: 12th grade  Occupational History   Occupation: retired  Tobacco Use   Smoking status: Former   Smokeless tobacco: Never  Scientific laboratory technician Use: Not on file  Substance and Sexual Activity   Alcohol use: Never   Drug use: Never   Sexual activity: Never    Birth control/protection: None  Other Topics Concern   Not on file  Social History Narrative   Not on file   Social Determinants of Health   Financial Resource Strain: High Risk (04/27/2022)   Overall Financial Resource Strain (CARDIA)    Difficulty of Paying Living Expenses: Very hard  Food Insecurity: Food Insecurity Present (04/27/2022)   Hunger Vital Sign    Worried About Running Out of Food in the Last Year: Often true    Ran Out of Food in the Last Year: Often true  Transportation Needs: Unmet Transportation Needs (04/27/2022)   PRAPARE - Transportation    Lack  of Transportation (Medical): Yes    Lack of Transportation (Non-Medical): Yes  Physical Activity: Sufficiently Active (04/16/2018)   Exercise Vital Sign    Days of Exercise per Week: 7 days    Minutes of Exercise per Session: 30 min  Stress: Stress Concern Present (04/16/2018)   [place]    Feeling of Stress : Very much  Social Connections: Moderately Isolated (04/30/2018)   Social Connection and Isolation Panel [NHANES]    Frequency of Communication with Friends and Family: Twice a week    Frequency of Social Gatherings with Friends and Family: Never    Attends Religious Services: More than 4 times per year    Active Member of [employer] or Organizations: No    Attends Archivist Meetings: Never    Marital  Status: Widowed   Allergies  Allergen Reactions   Codeine Itching and Swelling    Rash, Swelling of face/tongue/legs, itching   Novocain [Procaine]    Sulfa Antibiotics Other (See Comments)    Medications   Medications Prior to Admission  Medication Sig Dispense Refill Last Dose   albuterol (PROVENTIL) (2.5 MG/3ML) 0.083% nebulizer solution Take 3 mLs by nebulization every 4 (four) hours as needed for wheezing or shortness of breath. (Patient not taking: Reported on 04/26/2022) 75 mL 0    albuterol (VENTOLIN HFA) 108 (90 Base) MCG/ACT inhaler Inhale 2 puffs into the lungs every 6 (six) hours as needed for wheezing or shortness of breath. 1 each 5    ascorbic acid (VITAMIN C) 500 MG tablet Take 1 tablet (500 mg total) by mouth daily. (Patient not taking: Reported on 04/26/2022)      aspirin EC 81 MG tablet Take 1 tablet (81 mg total) by mouth daily. Swallow whole. 30 tablet 5    atorvastatin (LIPITOR) 40 MG tablet Take 1 tablet (40 mg total) by mouth daily. 30 tablet 5    buprenorphine-naloxone (SUBOXONE) 2-0.5 mg SUBL SL tablet Place 1 tablet under the tongue in the morning and at bedtime. Juanda Crumble drew (Patient not taking: Reported on 04/26/2022)      cetirizine (ZYRTEC ALLERGY) 10 MG tablet Take 1 tablet (10 mg total) by mouth daily. (Patient not taking: Reported on 04/26/2022) 30 tablet 0    cyclobenzaprine (FLEXERIL) 5 MG tablet Take 5 mg by mouth at bedtime as needed for muscle spasms. (Patient not taking: Reported on 04/26/2022)      EPINEPHrine 0.3 mg/0.3 mL IJ SOAJ injection Inject 0.3 mg into the muscle as needed for anaphylaxis. (Patient not taking: Reported on 04/26/2022)      fluticasone (FLONASE) 50 MCG/ACT nasal spray Place 1 spray into both nostrils daily. (Patient not taking: Reported on 04/26/2022)      furosemide (LASIX) 80 MG tablet Take 1 tablet (80 mg total) by mouth daily. 30 tablet 5    glucosamine-chondroitin 500-400 MG tablet Take 1 tablet by mouth 3 (three) times daily.  (Patient not taking: Reported on 04/26/2022)      JARDIANCE 10 MG TABS tablet Take 1 tablet (10 mg total) by mouth daily. 30 tablet 5    lisinopril (ZESTRIL) 40 MG tablet Take 1 tablet (40 mg total) by mouth daily. 30 tablet 5    metFORMIN (GLUCOPHAGE-XR) 500 MG 24 hr tablet Take 1 tablet (500 mg total) by mouth daily. 30 tablet 5    Multiple Vitamin (MULTIVITAMIN WITH MINERALS) TABS tablet Take 1 tablet by mouth daily. (Patient not taking: Reported on 04/26/2022)  nitroGLYCERIN (NITROSTAT) 0.4 MG SL tablet Place 1 tablet (0.4 mg total) under the tongue every 5 (five) minutes x 3 doses as needed for chest pain. 30 tablet 5    omeprazole (PRILOSEC) 40 MG capsule Take 1 capsule (40 mg total) by mouth daily for 14 days. (Patient not taking: Reported on 04/26/2022) 14 capsule 0    PARoxetine (PAXIL) 10 MG tablet Take 1 tablet (10 mg total) by mouth daily. 30 tablet 5    sucralfate (CARAFATE) 1 GM/10ML suspension Take 10 mLs (1 g total) by mouth 4 (four) times daily. (Patient not taking: Reported on 04/26/2022) 420 mL 1    tamsulosin (FLOMAX) 0.4 MG CAPS capsule Take 0.4 mg by mouth daily.         Current Facility-Administered Medications:    acetaminophen (TYLENOL) tablet 650 mg, 650 mg, Oral, Q4H PRN, 650 mg at 04/29/22 1158 **OR** acetaminophen (TYLENOL) 160 MG/5ML solution 650 mg, 650 mg, Per Tube, Q4H PRN **OR** acetaminophen (TYLENOL) suppository 650 mg, 650 mg, Rectal, Q4H PRN, Agbata, Tochukwu, MD   albuterol (PROVENTIL) (2.5 MG/3ML) 0.083% nebulizer solution 3 mL, 3 mL, Nebulization, Q4H PRN, Agbata, Tochukwu, MD   alum & mag hydroxide-simeth (MAALOX/MYLANTA) 200-200-20 MG/5ML suspension 30 mL, 30 mL, Oral, Q4H PRN, Agbata, Tochukwu, MD, 30 mL at 04/28/22 2240   aspirin EC tablet 81 mg, 81 mg, Oral, Daily, Agbata, Tochukwu, MD, 81 mg at 04/29/22 0915   atorvastatin (LIPITOR) tablet 40 mg, 40 mg, Oral, Daily, Agbata, Tochukwu, MD, 40 mg at 04/29/22 0915   furosemide (LASIX) tablet 80 mg, 80 mg,  Oral, Daily, Agbata, Tochukwu, MD, 80 mg at 04/29/22 0915   lisinopril (ZESTRIL) tablet 40 mg, 40 mg, Oral, Daily, Agbata, Tochukwu, MD, 40 mg at 04/29/22 0915   nitroGLYCERIN (NITROSTAT) SL tablet 0.4 mg, 0.4 mg, Sublingual, Q5 Min x 3 PRN, Agbata, Tochukwu, MD   PARoxetine (PAXIL) tablet 10 mg, 10 mg, Oral, Daily, Agbata, Tochukwu, MD, 10 mg at 04/29/22 0915   pneumococcal 20-valent conjugate vaccine (PREVNAR 20) injection 0.5 mL, 0.5 mL, Intramuscular, Tomorrow-1000, Agbata, Tochukwu, MD  Vitals   Vitals:   04/29/22 0827 04/29/22 0911 04/29/22 1200 04/29/22 1654  BP: 112/84 121/65 124/65 132/67  Pulse: (!) 57  (!) 57 60  Resp: '18  18 18  '$ Temp: 98.5 F (36.9 C)  98.6 F (37 C) 98.6 F (37 C)  TempSrc:   Oral   SpO2: 100%  99% 98%  Weight:      Height:         Body mass index is 43.58 kg/m.  Physical Exam   Physical Exam Gen: A&O x4, NAD HEENT: Atraumatic, normocephalic;mucous membranes moist; oropharynx clear, tongue without atrophy or fasciculations. Neck: Supple, trachea midline. Resp: CTAB, no w/r/r CV: RRR, no m/g/r; nml S1 and S2. 2+ symmetric peripheral pulses. Abd: soft/NT/ND; nabs x 4 quad Extrem: Nml bulk; no cyanosis, clubbing, or edema.  Neuro: *MS: A&O x4. Follows multi-step commands.  *Speech: fluid, mild dysarthria, able to name and repeat *CN:    I: Deferred   II,III: PERRLA, VFF by confrontation, optic discs unable to be visualized 2/2 pupillary constriction   III,IV,VI: EOMI w/o nystagmus, no ptosis   V: Sensation intact from V1 to V3 to LT   VII: Eyelid closure was full.  Smile symmetric.   VIII: Hearing intact to voice   IX,X: Voice normal, palate elevates symmetrically    XI: SCM/trap 5/5 bilat   XII: Tongue protrudes midline, no atrophy or fasciculations   *  Motor:   Normal bulk.  No tremor, rigidity or bradykinesia. Significant giveway weakness in all extremities, poor effort, minimal improvement with coaching. 3/5 RUE and RLE, 4-/5 LUE and  LLE.  *Sensory: Mild subjective paresthesias R side with no objective sensory deficit on exam. No double-simultaneous extinction.  *Coordination:  FNF does not give participatory effort *Reflexes:  1+ and symmetric throughout without clonus; toes down-going bilat *Gait: deferred  Labs   CBC:  Recent Labs  Lab 04/26/22 0307 04/27/22 2035  WBC 3.9* 3.5*  HGB 10.8* 10.5*  HCT 33.4* 32.4*  MCV 91.8 91.0  PLT 112* 95*    Basic Metabolic Panel:  Lab Results  Component Value Date   NA 139 04/27/2022   K 3.7 04/27/2022   CO2 26 04/27/2022   GLUCOSE 115 (H) 04/27/2022   BUN 12 04/27/2022   CREATININE 0.92 04/27/2022   CALCIUM 9.1 04/27/2022   GFRNONAA >60 04/27/2022   GFRAA 58 (L) 04/19/2020   Lipid Panel:  Lab Results  Component Value Date   LDLCALC 57 04/29/2022   HgbA1c:  Lab Results  Component Value Date   HGBA1C 5.4 03/26/2022   Urine Drug Screen:     Component Value Date/Time   LABOPIA NONE DETECTED 03/15/2022 0332   COCAINSCRNUR NONE DETECTED 03/15/2022 0332   LABBENZ NONE DETECTED 03/15/2022 0332   AMPHETMU NONE DETECTED 03/15/2022 0332   THCU NONE DETECTED 03/15/2022 0332   LABBARB NONE DETECTED 03/15/2022 0332    Alcohol Level     Component Value Date/Time   [facility] <10 03/15/2022 0332     Impression   This is a 71 yo man currently homeless with hx CHF, DM2, HTN, OSA who presented to ED after an episode of syncope (3rd this month). Admitting physician noted generalized weakness, slightly worse on R and consulted neurology 2/2 c/f possible ischemic event.  I was consulted for neurologic sx (R weakness and numbness) which upon further hx and exam are not new so require no further specific neuro workup. MRI brain and c spine showed no acute process. His chief complaint to me was recurrent syncope (now 1x/wk, has presented to ED after syncope 3x in the past mo). He had echo a month ago during admission for syncope that didn't show AS and my strong suspicion is  for cardiac etiology given semiology of spells. They are not concerning for seizures based on description (drop attacks without prodrome, LOC lasting seconds, no post-event confusion or lethargy). I would recommend touching base with cardiology and making sure he is discharged with ambulatory cardiac monitoring and cards f/u. That will likely be difficult to arrange given that he is homeless and presumably uninsured, but would make best efforts to arrange. Social work consult may be helpful with this.  Recommendations   - No indication for further inpatient neurologic workup - OK to continue home ASA '81mg'$  daily at discharge, no neuro indication to start plavix - I would recommend touching base with cardiology and making sure he is discharged with ambulatory cardiac monitoring and cards f/u. That will likely be difficult to arrange given that he is homeless and presumably uninsured, but would make best efforts to arrange. Social work consult may be helpful with this. - No f/u with neurology needed unless patient develops new neurologic problems in the future  Neurology to sign off, please re-engage if additional neurologic concerns arise. ______________________________________________________________________   Thank you for the opportunity to take part in the care of this patient. If you have any  further questions, please contact the neurology consultation attending.  Signed,  Su Monks, MD [facility] [phone]  If 7pm- 7am, please page neurology on call as listed in [place].

## 2022-04-29 NOTE — Progress Notes (Signed)
SLP Cancellation Note  Patient Details Name: KATHLEEN LIKINS MRN: [id number] DOB: 1951/07/21   Cancelled treatment:       Reason Eval/Treat Not Completed: SLP screened, no needs identified, will sign off  Earl Zellmer B. Rutherford Nail, M.S., CCC-SLP, Mining engineer Certified Brain Injury [place]  [place] Office [phone] Ascom [phone] Fax [phone]

## 2022-04-29 NOTE — Progress Notes (Signed)
Progress Note   Patient: [name]:[id number] DOB: 1950/12/04 DOA: 04/28/2022     0 DOS: the patient was seen and examined on 04/29/2022   Brief hospital course: 71 year old homeless male with PMH of HTN, DM2, COPD, Diastolic HF, Hepatic Cirrhosis, and Morbid Obesity (BMI 43 kg/m2) who presented to the ED on 9/2 with right sided numbness and weakness which are chronic neurological complaints.  MRI of the brain showed no acute abnormality.  Patient does endorse passing out three times this past month. The description is not consistent with seizures.  03/25/22 Echo showed normal EF 60-65% and normal aortic valve.  Cardiology will be consulted for a Holter monitor if possible.  Assessment and Plan: Acute Right Sided Numbness: This is likely a cervical radiculopathy. - MRI Brain showed no acute abnormality. - MRI of the cervical spine showed mod-severe C4-C6 foraminal stenosis.  Recurrent Syncope: - 03/25/22 Echo showed no acute abnormality. - Consult Cardiology for Holter monitor if possible, appreciate.  Liver Cirrhosis: MRI of brian reported basal ganglia changes. - Hold Lactulose as patient has chronic diarrhea and his mental status is at baseline.  Depression Paxil  Chronic diastolic CHF (congestive heart failure) (HCC) - Continue ACE and Lasix as needed.  Obesity BMI 43.5 - Counseled on weight loss through diet and lifestyle modifications.  Essential Hypertension: BP is stable. - Continue ACE.  Homelessness TOC consult  Family Communication: None present.  I spoke with patient at bedside.  Disposition: Status is: Observation   Planned Discharge Destination: Patient is homeless.  His girlfriend lives in a male only group home.  We will discuss with Social Worker in the morning.        Subjective: Mr. Interrante is lying in bed comfortably.  There are no focal deficits on exam.  Mental status is at baseline.  Telemetry has been stable x 24 hours.  Patient reports  episode of diarrhea this morning - I discontinued his Lactulose.   Physical Exam: Vitals:   04/29/22 0355 04/29/22 0827 04/29/22 0911 04/29/22 1200  BP: (!) 146/76 112/84 121/65 124/65  Pulse: 69 (!) 57  (!) 57  Resp: '18 18  18  '$ Temp: 99.1 F (37.3 C) 98.5 F (36.9 C)  98.6 F (37 C)  TempSrc:    Oral  SpO2: 99% 100%  99%  Weight:      Height:       Physical Exam Constitutional:      Appearance: Normal appearance.  HENT:     Head: Normocephalic and atraumatic.     Mouth/Throat:     Pharynx: Oropharynx is clear.  Eyes:     Extraocular Movements: Extraocular movements intact.     Pupils: Pupils are equal, round, and reactive to light.  Cardiovascular:     Rate and Rhythm: Normal rate and regular rhythm.     Pulses: Normal pulses.     Heart sounds: Normal heart sounds.  Pulmonary:     Effort: Pulmonary effort is normal.     Breath sounds: Normal breath sounds.  Abdominal:     General: Abdomen is flat.     Palpations: Abdomen is soft.  Musculoskeletal:     Cervical back: Neck supple.  Skin:    General: Skin is warm and dry.  Neurological:     General: No focal deficit present.     Mental Status: He is alert.  Psychiatric:        Mood and Affect: Mood normal.     Data  Reviewed:  MR CERVICAL SPINE WO CONTRAST CLINICAL DATA:  71 year old male with neurologic deficit. Right side weakness. Extremity pain. Known Cirrhosis.  EXAM: MRI CERVICAL SPINE WITHOUT CONTRAST  TECHNIQUE: Multiplanar, multisequence MR imaging of the cervical spine was performed. No intravenous contrast was administered.  COMPARISON:  Brain MRI today reported separately. Cervical spine CT 03/10/2022.  FINDINGS: Alignment: Stable mild straightening of cervical lordosis.  Vertebrae: Chronic C6-C7 ankylosis. No marrow edema or evidence of acute osseous abnormality. T3 benign appearing vertebral body hemangioma.  Cord: Limited cervical spinal cord detail despite repeated sagittal and  axial imaging attempts. No convincing cord edema, but difficult to exclude myelomalacia. See spinal stenosis details below.  Posterior Fossa, vertebral arteries, paraspinal tissues: Cervicomedullary junction is within normal limits. Brain is reported separately today. Neck soft tissues degraded by motion.  Disc levels:  C2-C3: Mild disc and endplate degeneration. No significant stenosis.  C3-C4: Chronic disc space loss. Circumferential disc osteophyte complex with posterior element hypertrophy. At least mild spinal stenosis. Moderate to severe C4 foraminal stenosis, greater on the right on the CT in July.  C4-C5: Disc space loss. Broad-based posterior disc osteophyte complex with some posterior element hypertrophy. Possible moderate spinal stenosis here and associated cord mass effect. Moderate to severe bilateral C5 foraminal stenosis better demonstrated in July.  C5-C6: Similar bulky disc osteophyte complex and spinal stenosis which could be up to moderate. Suspected cord mass effect. Moderate to severe bilateral C6 foraminal stenosis better demonstrated in July.  C6-C7:  Chronic interbody ankylosis.  C7-T1:  Mild to moderate facet hypertrophy.  No definite stenosis.  IMPRESSION: 1. Motion degraded exam with no evidence of acute osseous abnormality. Chronic C6-C7 ankylosis.  2. Bulky chronic disc and endplate degeneration, also demonstrated by CT in July. Subsequent cervical spinal stenosis from C3-C4 through C5-C6 could be MODERATE, along with spinal cord mass effect. But spinal cord signal is poorly evaluated on this exam.  3. 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  Electronically Signed   By: Genevie Ann M.D.   On: 04/28/2022 11:03 MR BRAIN WO CONTRAST CLINICAL DATA:  71 year old male with neurologic deficit. Right side weakness. Extremity pain. Known Cirrhosis.  EXAM: MRI HEAD WITHOUT CONTRAST  TECHNIQUE: Multiplanar, multiecho  pulse sequences of the brain and surrounding structures were obtained without intravenous contrast.  COMPARISON:  Head CT 0905 hours today.  Brain MRI 07/31/2021.  FINDINGS: Brain: No restricted diffusion to suggest acute infarction. No midline shift, mass effect, evidence of mass lesion, ventriculomegaly, extra-axial collection or acute intracranial hemorrhage. Cervicomedullary junction and pituitary are within normal limits.  Stable gray and white matter signal throughout the brain since last year. No cortical encephalomalacia, chronic cerebral blood products, or white matter disease. Intrinsic globus pallidus T1 hyperintensity re-demonstrated.  Vascular: Major intracranial vascular flow voids are stable since last year.  Skull and upper cervical spine: Cervical spine is detailed separately today. Visualized bone marrow signal is within normal limits.  Sinuses/Orbits: Stable and negative.  Other: Mastoids remain clear.  IMPRESSION: No acute intracranial abnormality.  Stable noncontrast MRI appearance of the brain since last year, remarkable for cirrhosis related basal ganglia signal changes. Correlation with serum ammonia level may be valuable.  Electronically Signed   By: Genevie Ann M.D.   On: 04/28/2022 10:57 CT Head Wo Contrast CLINICAL DATA:  Neuro deficit.  Stroke suspected.  EXAM: CT HEAD WITHOUT CONTRAST  TECHNIQUE: Contiguous axial images were obtained from the base of the skull through the vertex without  intravenous contrast.  RADIATION DOSE REDUCTION: This exam was performed according to the departmental dose-optimization program which includes automated exposure control, adjustment of the mA and/or kV according to patient size and/or use of iterative reconstruction technique.  COMPARISON:  03/21/2022  FINDINGS: Brain: There is no evidence for acute hemorrhage, hydrocephalus, mass lesion, or abnormal extra-axial fluid collection. No definite CT  evidence for acute infarction.  Vascular: No hyperdense vessel or unexpected calcification.  Skull: No evidence for fracture. No worrisome lytic or sclerotic lesion.  Sinuses/Orbits: The visualized paranasal sinuses and mastoid air cells are clear. Visualized portions of the globes and intraorbital fat are unremarkable.  Other: None.  IMPRESSION: Stable.  No acute intracranial abnormality.  Electronically Signed   By: Misty Stanley M.D.   On: 04/28/2022 09:12  Lab Results  Component Value Date   WBC 3.5 (L) 04/27/2022   HGB 10.5 (L) 04/27/2022   HCT 32.4 (L) 04/27/2022   MCV 91.0 04/27/2022   PLT 95 (L) 37/11/8887   Last metabolic panel Lab Results  Component Value Date   GLUCOSE 115 (H) 04/27/2022   NA 139 04/27/2022   K 3.7 04/27/2022   CL 108 04/27/2022   CO2 26 04/27/2022   BUN 12 04/27/2022   CREATININE 0.92 04/27/2022   GFRNONAA >60 04/27/2022   CALCIUM 9.1 04/27/2022   PHOS 2.5 03/28/2018   PROT 7.0 04/19/2022   ALBUMIN 2.5 (L) 04/19/2022   BILITOT 2.6 (H) 04/19/2022   ALKPHOS 68 04/19/2022   AST 57 (H) 04/19/2022   ALT 34 04/19/2022   ANIONGAP 5 04/27/2022         Time spent: >30 minutes  Author: George Hugh, MD 04/29/2022 2:04 PM  For on call review [email or web address].

## 2022-04-30 DIAGNOSIS — R55 Syncope and collapse: Secondary | ICD-10-CM | POA: Diagnosis not present

## 2022-04-30 DIAGNOSIS — K7460 Unspecified cirrhosis of liver: Secondary | ICD-10-CM

## 2022-04-30 DIAGNOSIS — R296 Repeated falls: Secondary | ICD-10-CM

## 2022-04-30 DIAGNOSIS — R531 Weakness: Secondary | ICD-10-CM | POA: Diagnosis not present

## 2022-04-30 DIAGNOSIS — R7881 Bacteremia: Secondary | ICD-10-CM

## 2022-04-30 DIAGNOSIS — G459 Transient cerebral ischemic attack, unspecified: Secondary | ICD-10-CM | POA: Diagnosis not present

## 2022-04-30 LAB — BASIC METABOLIC PANEL
Anion gap: 8 (ref 5–15)
BUN: 11 mg/dL (ref 8–23)
CO2: 23 mmol/L (ref 22–32)
Calcium: 8.5 mg/dL — ABNORMAL LOW (ref 8.9–10.3)
Chloride: 107 mmol/L (ref 98–111)
Creatinine, Ser: 0.95 mg/dL (ref 0.61–1.24)
GFR, Estimated: >60 mL/min (ref >60)
Glucose, Bld: 87 mg/dL (ref 70–99)
Potassium: 3.6 mmol/L (ref 3.5–5.1)
Sodium: 138 mmol/L (ref 135–145)

## 2022-04-30 LAB — CBC
HCT: 31.5 % — ABNORMAL LOW (ref 39.0–52.0)
Hemoglobin: 10.2 g/dL — ABNORMAL LOW (ref 13.0–17.0)
MCH: 29.4 pg (ref 26.0–34.0)
MCHC: 32.4 g/dL (ref 30.0–36.0)
MCV: 90.8 fL (ref 80.0–100.0)
Platelets: 82 10*3/uL — ABNORMAL LOW (ref 150–400)
RBC: 3.47 MIL/uL — ABNORMAL LOW (ref 4.22–5.81)
RDW: 17.1 % — ABNORMAL HIGH (ref 11.5–15.5)
WBC: 2.2 10*3/uL — ABNORMAL LOW (ref 4.0–10.5)
nRBC: 0 % (ref 0.0–0.2)

## 2022-04-30 LAB — GLUCOSE, CAPILLARY
Glucose-Capillary: 140 mg/dL — ABNORMAL HIGH (ref 70–99)
Glucose-Capillary: 112 mg/dL — ABNORMAL HIGH (ref 70–99)

## 2022-04-30 NOTE — Consult Note (Signed)
Cardiology Consultation   Patient ID: ESPN ZEMAN MRN: [id number]; DOB: 05/14/1951  Admit date: 04/28/2022 Date of Consult: 04/30/2022  PCP:  Denton Lank, MD   [place] [facility] Cardiologist:new to [facility] Physician requesting consult: Dr. Coralee North Reason for consult: Syncope 80, request for monitor  Patient Profile:   [name] is a 71 y.o. male with a hx of homelessness, chronic diastolic CHF, hypertension, COPD, obesity, weakness presents to the hospital after a fall September 1  History of Present Illness:   [name] presented to the hospital September 1 in the p.m. with extremity pain.  On further discussion with the emergency room physician, he reported having a fall August 31 6 PM with weakness right side of his body at that time.  Reports weakness in his arms, legs.  Typically walks with a cane when he has one.  History of homelessness, notes indicating often goes without his medications.  Currently does not have place to live.  Has a girlfriend who is trying to get settled in a new place, then he will move in with her.  She is 20 years younger than he.   He reports having episodes of loss of consciousness/syncope, 5 episodes in the past month or so of unclear etiology.  Reports these episodes of syncope will lead to a fall.   Review of hospital records indicates frequent evaluation in the emergency room Evaluated for syncope with hospitalization end of July into beginning of August Work-up revealed Klebsiella bacteremia, treated with IV antibiotics and fluids.  Discharged on 8-day course of Levaquin  In the ER August 12 for leg swelling, out of his Lasix In the ER August 14 for leg swelling, reports there was no prescription for Lasix In the ER August 25 after a fall In the ER April 26, 2022 chest pain, wheezing, cough, body aches In the ER September 2 weakness, fall In the ER March 21, 2022 shortness of breath In the ER July 20  hallucinations Past Medical History:  Diagnosis Date   Asthma    CHF (congestive heart failure) ([place])    COPD (chronic obstructive pulmonary disease) ([place])    COVID-19 03/2020   diagnosed in August 2021   Diabetes mellitus without complication ([place])    Homelessness    Hypertension    Infestation by bed bug    Migraine    Obesity    Sleep apnea     Past Surgical History:  Procedure Laterality Date   CARDIAC CATHETERIZATION     CHOLECYSTECTOMY     EYE SURGERY     INNER EAR SURGERY     NOSE SURGERY       Home Medications:  Prior to Admission medications   Medication Sig Start Date End Date Taking? Authorizing Provider  albuterol (PROVENTIL) (2.5 MG/3ML) 0.083% nebulizer solution Take 3 mLs by nebulization every 4 (four) hours as needed for wheezing or shortness of breath. Patient not taking: Reported on 04/26/2022 03/10/22 04/09/22  Vanessa Millheim, MD  albuterol (VENTOLIN HFA) 108 (90 Base) MCG/ACT inhaler Inhale 2 puffs into the lungs every 6 (six) hours as needed for wheezing or shortness of breath. 04/26/22 05/26/22  Alisa Graff, FNP  ascorbic acid (VITAMIN C) 500 MG tablet Take 1 tablet (500 mg total) by mouth daily. Patient not taking: Reported on 04/26/2022 04/20/20   Samuella Cota, MD  aspirin EC 81 MG tablet Take 1 tablet (81 mg total) by mouth daily. Swallow whole. 04/26/22  Darylene Price A, FNP  atorvastatin (LIPITOR) 40 MG tablet Take 1 tablet (40 mg total) by mouth daily. 04/26/22   Alisa Graff, FNP  buprenorphine-naloxone (SUBOXONE) 2-0.5 mg SUBL SL tablet Place 1 tablet under the tongue in the morning and at bedtime. Juanda Crumble drew Patient not taking: Reported on 04/26/2022    [provider]  cetirizine (ZYRTEC ALLERGY) 10 MG tablet Take 1 tablet (10 mg total) by mouth daily. Patient not taking: Reported on 04/26/2022 07/04/20   Blake Divine, MD  cyclobenzaprine (FLEXERIL) 5 MG tablet Take 5 mg by mouth at bedtime as needed for muscle spasms. Patient  not taking: Reported on 04/26/2022    [provider]  EPINEPHrine 0.3 mg/0.3 mL IJ SOAJ injection Inject 0.3 mg into the muscle as needed for anaphylaxis. Patient not taking: Reported on 04/26/2022    [provider]  fluticasone (FLONASE) 50 MCG/ACT nasal spray Place 1 spray into both nostrils daily. Patient not taking: Reported on 04/26/2022    [provider]  furosemide (LASIX) 80 MG tablet Take 1 tablet (80 mg total) by mouth daily. 04/26/22   Alisa Graff, FNP  glucosamine-chondroitin 500-400 MG tablet Take 1 tablet by mouth 3 (three) times daily. Patient not taking: Reported on 04/26/2022    [provider]  JARDIANCE 10 MG TABS tablet Take 1 tablet (10 mg total) by mouth daily. 04/26/22   Alisa Graff, FNP  lisinopril (ZESTRIL) 40 MG tablet Take 1 tablet (40 mg total) by mouth daily. 04/26/22   Alisa Graff, FNP  metFORMIN (GLUCOPHAGE-XR) 500 MG 24 hr tablet Take 1 tablet (500 mg total) by mouth daily. 04/26/22   Alisa Graff, FNP  Multiple Vitamin (MULTIVITAMIN WITH MINERALS) TABS tablet Take 1 tablet by mouth daily. Patient not taking: Reported on 04/26/2022    [provider]  nitroGLYCERIN (NITROSTAT) 0.4 MG SL tablet Place 1 tablet (0.4 mg total) under the tongue every 5 (five) minutes x 3 doses as needed for chest pain. 04/26/22   Alisa Graff, FNP  omeprazole (PRILOSEC) 40 MG capsule Take 1 capsule (40 mg total) by mouth daily for 14 days. Patient not taking: Reported on 04/26/2022 03/10/22 03/26/22  Vanessa [place], MD  PARoxetine (PAXIL) 10 MG tablet Take 1 tablet (10 mg total) by mouth daily. 04/26/22   Alisa Graff, FNP  sucralfate (CARAFATE) 1 GM/10ML suspension Take 10 mLs (1 g total) by mouth 4 (four) times daily. Patient not taking: Reported on 04/26/2022 12/01/21   Merlyn Lot, MD  tamsulosin (FLOMAX) 0.4 MG CAPS capsule Take 0.4 mg by mouth daily.    [provider]    Inpatient Medications: Scheduled Meds:   aspirin EC  81 mg Oral Daily   atorvastatin  40 mg Oral Daily   furosemide  80 mg Oral Daily   lisinopril  40 mg Oral Daily   PARoxetine  10 mg Oral Daily   pneumococcal 20-valent conjugate vaccine  0.5 mL Intramuscular Tomorrow-1000   Continuous Infusions:  PRN Meds: acetaminophen **OR** acetaminophen (TYLENOL) oral liquid 160 mg/5 mL **OR** acetaminophen, albuterol, alum & mag hydroxide-simeth, nitroGLYCERIN  Allergies:    Allergies  Allergen Reactions   Codeine Itching and Swelling    Rash, Swelling of face/tongue/legs, itching   Novocain [Procaine]    Sulfa Antibiotics Other (See Comments)    Social History:   Social History   Socioeconomic History   Marital status: Single    Spouse name: Not on file  Number of children: 2   Years of education: 12   Highest education level: 12th grade  Occupational History   Occupation: retired  Tobacco Use   Smoking status: Former   Smokeless tobacco: Never  Scientific laboratory technician Use: Not on file  Substance and Sexual Activity   Alcohol use: Never   Drug use: Never   Sexual activity: Never    Birth control/protection: None  Other Topics Concern   Not on file  Social History Narrative   Not on file   Social Determinants of Health   Financial Resource Strain: High Risk (04/27/2022)   Overall Financial Resource Strain (CARDIA)    Difficulty of Paying Living Expenses: Very hard  Food Insecurity: Food Insecurity Present (04/27/2022)   Hunger Vital Sign    Worried About [place] in the Last Year: Often true    [place] in the Last Year: Often true  Transportation Needs: Unmet Transportation Needs (04/27/2022)   PRAPARE - Transportation    Lack of Transportation (Medical): Yes    Lack of Transportation (Non-Medical): Yes  Physical Activity: Sufficiently Active (04/16/2018)   Exercise Vital Sign    Days of Exercise per Week: 7 days    Minutes of Exercise per Session: 30 min  Stress: Stress Concern Present  (04/16/2018)   [place]    Feeling of Stress : Very much  Social Connections: Moderately Isolated (04/30/2018)   Social Connection and Isolation Panel [NHANES]    Frequency of Communication with Friends and Family: Twice a week    Frequency of Social Gatherings with Friends and Family: Never    Attends Religious Services: More than 4 times per year    Active Member of [employer] or Organizations: No    Attends Archivist Meetings: Never    Marital Status: Widowed  Intimate Partner Violence: Unknown (04/16/2018)   [name], Afraid, [name], and Kick questionnaire    Fear of Current or Ex-Partner: Patient refused    Emotionally Abused: Patient refused    Physically Abused: Patient refused    Sexually Abused: Patient refused    Family History:    Family History  Problem Relation Age of Onset   Hypertension Mother    Heart disease Mother    Heart failure Maternal Grandmother      ROS:  Please see the history of present illness.  Review of Systems  Constitutional: Negative.   HENT: Negative.    Respiratory: Negative.    Cardiovascular: Negative.   Gastrointestinal: Negative.   Musculoskeletal:  Positive for falls.  Neurological:  Positive for loss of consciousness.  Psychiatric/Behavioral: Negative.    All other systems reviewed and are negative.   Physical Exam/Data:   Vitals:   04/29/22 2052 04/30/22 0049 04/30/22 0526 04/30/22 0842  BP: 122/67 (!) 171/70 (!) 147/81 133/68  Pulse: (!) 59 (!) 58 (!) 58 (!) 53  Resp: '17 18 16 16  '$ Temp: 99.7 F (37.6 C) 98.3 F (36.8 C) 98.8 F (37.1 C) 98.5 F (36.9 C)  TempSrc: Oral     SpO2: 99% 100% 98% 99%  Weight:      Height:       No intake or output data in the 24 hours ending 04/30/22 1031    04/27/2022    8:26 PM 04/26/2022   10:52 AM 04/26/2022    3:00 AM  Last 3 Weights  Weight (lbs) 270 lb 270 lb 260 lb  Weight (kg) 122.471 kg 122.471 kg 117.935  kg     Body mass index is 43.58 kg/m.  General:  Well nourished, well developed, in no acute distress HEENT: normal Neck: no JVD Vascular: No carotid bruits; Distal pulses 2+ bilaterally Cardiac:  normal S1, S2; RRR; no murmur  Lungs:  clear to auscultation bilaterally, no wheezing, rhonchi or rales  Abd: soft, nontender, no hepatomegaly  Ext: no edema Musculoskeletal:  No deformities, BUE and BLE strength normal and equal Skin: warm and dry  Neuro:  CNs 2-12 intact, no focal abnormalities noted Psych:  Normal affect   EKG:  The EKG was personally reviewed and demonstrates:   Normal sinus rhythm rate 59 bpm no significant ST-T wave changes Telemetry:  Telemetry was personally reviewed and demonstrates:   Normal sinus rhythm, 1 alert, 11 beat run narrow complex tachycardia consistent with SVT  Relevant CV Studies: Echo March 25, 2022  1. Left ventricular ejection fraction, by estimation, is 60 to 65%. The  left ventricle has normal function. The left ventricle has no regional  wall motion abnormalities. Left ventricular diastolic parameters were  normal.   2. Right ventricular systolic function is normal. The right ventricular  size is normal.   3. The mitral valve is normal in structure. No evidence of mitral valve  regurgitation. No evidence of mitral stenosis.   4. The aortic valve is normal in structure. Aortic valve regurgitation is  not visualized. Aortic valve sclerosis is present, with no evidence of  aortic valve stenosis.   5. The inferior vena cava is normal in size with greater than 50%  respiratory variability, suggesting right atrial pressure of 3 mmHg.   Laboratory Data:  High Sensitivity Troponin:   Recent Labs  Lab 04/26/22 0307 04/26/22 0518 04/27/22 2035 04/28/22 1127  TROPONINIHS '16 15 15 11     '$ Chemistry Recent Labs  Lab 04/26/22 0307 04/27/22 2035 04/30/22 0511  NA 139 139 138  K 4.4 3.7 3.6  CL 109 108 107  CO2 '25 26 23  '$ GLUCOSE 105*  115* 87  BUN '13 12 11  '$ CREATININE 0.92 0.92 0.95  CALCIUM 9.1 9.1 8.5*  GFRNONAA >60 >60 >60  ANIONGAP '5 5 8    '$ No results for input(s): "PROT", "ALBUMIN", "AST", "ALT", "ALKPHOS", "BILITOT" in the last 168 hours. Lipids  Recent Labs  Lab 04/29/22 0401  CHOL 92  TRIG 51  HDL 25*  LDLCALC 57  CHOLHDL 3.7    Hematology Recent Labs  Lab 04/26/22 0307 04/27/22 2035 04/30/22 0511  WBC 3.9* 3.5* 2.2*  RBC 3.64* 3.56* 3.47*  HGB 10.8* 10.5* 10.2*  HCT 33.4* 32.4* 31.5*  MCV 91.8 91.0 90.8  MCH 29.7 29.5 29.4  MCHC 32.3 32.4 32.4  RDW 16.7* 16.9* 17.1*  PLT 112* 95* 82*   Thyroid No results for input(s): "TSH", "FREET4" in the last 168 hours.  BNP Recent Labs  Lab 04/26/22 0307 04/27/22 2035  BNP 446.2* 376.8*    DDimer No results for input(s): "DDIMER" in the last 168 hours.   Radiology/Studies:  MR CERVICAL SPINE WO CONTRAST  Result Date: 04/28/2022 CLINICAL DATA:  71 year old male with neurologic deficit. Right side weakness. Extremity pain. Known Cirrhosis. EXAM: MRI CERVICAL SPINE WITHOUT CONTRAST TECHNIQUE: Multiplanar, multisequence MR imaging of the cervical spine was performed. No intravenous contrast was administered. COMPARISON:  Brain MRI today reported separately. Cervical spine CT 03/10/2022. FINDINGS: Alignment: Stable mild straightening of cervical lordosis. Vertebrae: Chronic C6-C7 ankylosis. No marrow edema or  evidence of acute osseous abnormality. T3 benign appearing vertebral body hemangioma. Cord: Limited cervical spinal cord detail despite repeated sagittal and axial imaging attempts. No convincing cord edema, but difficult to exclude myelomalacia. See spinal stenosis details below. Posterior Fossa, vertebral arteries, paraspinal tissues: Cervicomedullary junction is within normal limits. Brain is reported separately today. Neck soft tissues degraded by motion. Disc levels: C2-C3: Mild disc and endplate degeneration. No significant stenosis. C3-C4: Chronic  disc space loss. Circumferential disc osteophyte complex with posterior element hypertrophy. At least mild spinal stenosis. Moderate to severe C4 foraminal stenosis, greater on the right on the CT in July. C4-C5: Disc space loss. Broad-based posterior disc osteophyte complex with some posterior element hypertrophy. Possible moderate spinal stenosis here and associated cord mass effect. Moderate to severe bilateral C5 foraminal stenosis better demonstrated in July. C5-C6: Similar bulky disc osteophyte complex and spinal stenosis which could be up to moderate. Suspected cord mass effect. Moderate to severe bilateral C6 foraminal stenosis better demonstrated in July. C6-C7:  Chronic interbody ankylosis. C7-T1:  Mild to moderate facet hypertrophy.  No definite stenosis. IMPRESSION: 1. Motion degraded exam with no evidence of acute osseous abnormality. Chronic C6-C7 ankylosis. 2. Bulky chronic disc and endplate degeneration, also demonstrated by CT in July. Subsequent cervical spinal stenosis from C3-C4 through C5-C6 could be MODERATE, along with spinal cord mass effect. But spinal cord signal is poorly evaluated on this exam. 3. 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 Electronically Signed   By: Genevie Ann M.D.   On: 04/28/2022 11:03   MR BRAIN WO CONTRAST  Result Date: 04/28/2022 CLINICAL DATA:  71 year old male with neurologic deficit. Right side weakness. Extremity pain. Known Cirrhosis. EXAM: MRI HEAD WITHOUT CONTRAST TECHNIQUE: Multiplanar, multiecho pulse sequences of the brain and surrounding structures were obtained without intravenous contrast. COMPARISON:  Head CT 0905 hours today.  Brain MRI 07/31/2021. FINDINGS: Brain: No restricted diffusion to suggest acute infarction. No midline shift, mass effect, evidence of mass lesion, ventriculomegaly, extra-axial collection or acute intracranial hemorrhage. Cervicomedullary junction and pituitary are within normal limits. Stable  gray and white matter signal throughout the brain since last year. No cortical encephalomalacia, chronic cerebral blood products, or white matter disease. Intrinsic globus pallidus T1 hyperintensity re-demonstrated. Vascular: Major intracranial vascular flow voids are stable since last year. Skull and upper cervical spine: Cervical spine is detailed separately today. Visualized bone marrow signal is within normal limits. Sinuses/Orbits: Stable and negative. Other: Mastoids remain clear. IMPRESSION: No acute intracranial abnormality. Stable noncontrast MRI appearance of the brain since last year, remarkable for cirrhosis related basal ganglia signal changes. Correlation with serum ammonia level may be valuable. Electronically Signed   By: Genevie Ann M.D.   On: 04/28/2022 10:57   CT Head Wo Contrast  Result Date: 04/28/2022 CLINICAL DATA:  Neuro deficit.  Stroke suspected. EXAM: CT HEAD WITHOUT CONTRAST TECHNIQUE: Contiguous axial images were obtained from the base of the skull through the vertex without intravenous contrast. RADIATION DOSE REDUCTION: This exam was performed according to the departmental dose-optimization program which includes automated exposure control, adjustment of the mA and/or kV according to patient size and/or use of iterative reconstruction technique. COMPARISON:  03/21/2022 FINDINGS: Brain: There is no evidence for acute hemorrhage, hydrocephalus, mass lesion, or abnormal extra-axial fluid collection. No definite CT evidence for acute infarction. Vascular: No hyperdense vessel or unexpected calcification. Skull: No evidence for fracture. No worrisome lytic or sclerotic lesion. Sinuses/Orbits: The visualized paranasal sinuses and mastoid air cells are  clear. Visualized portions of the globes and intraorbital fat are unremarkable. Other: None. IMPRESSION: Stable.  No acute intracranial abnormality. Electronically Signed   By: Misty Stanley M.D.   On: 04/28/2022 09:12   DG Chest 2  View  Result Date: 04/27/2022 CLINICAL DATA:  Chest pain EXAM: CHEST - 2 VIEW COMPARISON:  Radiograph 04/26/2022 FINDINGS: The cardiomediastinal silhouette is within normal limits. There is no focal airspace consolidation. There is no pleural effusion. No pneumothorax. No acute osseous abnormality. Thoracic spondylosis. IMPRESSION: No evidence of acute cardiopulmonary disease. Electronically Signed   By: Maurine Simmering M.D.   On: 04/27/2022 21:15     Assessment and Plan:   Falls, weakness Arrival to the emergency room reported weakness to right side of his body, leg gave out, numbness right arm and leg Neurology following, unable to exclude TIA No acute findings on MRI Elevated ammonia of 70, liver cirrhosis, treated with lactulose- now on hold in the setting of diarrhea  2.  Syncope Details somewhat vague Normal ejection fraction on echocardiogram end of July 2023 Recent bacteremia with Klebsiella last admission treated with Levaquin at discharge.  -Consider repeat blood cultures.  Unclear if he finished outpatient Levaquin prescription.  Long history of medication noncompliance No significant arrhythmia on telemetry overnight -[employer] monitor will bill the patient if this is placed.  May need to contact [employer] rep to see if charity arrangements can be made  Liver cirrhosis Mental status at his baseline Has been treated with lactulose, now on hold given diarrhea  Bacteremia, Klebsiella Treated on recent hospitalization, unclear if he filled his outpatient Levaquin prescription Recent emergency room notes dated August 31 reporting feeling hot and cold -Consider repeat blood cultures  Pancytopenia Slow trend down in all numbers Etiology unclear    Total encounter time more than 80 minutes  Greater than 50% was spent in counseling and coordination of care with the patient   For questions or updates, please contact [place] Please consult [email or web address] for contact info under     Signed, Ida Rogue, MD  04/30/2022 10:31 AM

## 2022-04-30 NOTE — TOC Progression Note (Signed)
Transition of Care [facility]) - Progression Note    Patient Details  Name: [name] MRN: [id number] Date of Birth: Jun 10, 1951  Transition of [facility]) CM/SW Contact  Laurena Slimmer, RN Phone Number: 04/30/2022, 12:59 PM  Clinical Narrative:    12:30pm Entered patient room to discuss discharge. Patient in bathroom had c/o dry heaves. Nurse notified.   12:38 Spoke with patient at bedside. Patient provided with shelter information. He stated he had been put on a unacceptable list and is ineligible to admit to shelters due to "Using someone else's computer." Patient requested a cab to the bus station [place]. Nurse notified. TOC signing off.           Expected Discharge Plan and Services           Expected Discharge Date: 04/30/22                                     Social Determinants of Health (SDOH) Interventions    Readmission Risk Interventions     No data to display

## 2022-04-30 NOTE — Progress Notes (Signed)
Pt informed of discharge. Pt stated good luck with that, they said they would get me a taxi and it's a holiday. Reinforced Pt plan was to d/c. Shortly after pt started dry-heaving no vomitus. MD made aware. Instructed to proceed with d/c. Pt sated he would like to appeal. MD and caseworker made aware. Per Case worker, d/t observation process appeal process does not apply. Pt prepared for d/c and taken to medical mall to await taxi with NT.

## 2022-04-30 NOTE — Discharge Summary (Signed)
Physician Discharge Summary  [name] MGQ:[id number] DOB: 1950/11/03 DOA: 04/28/2022  PCP: Denton Lank, MD  Admit date: 04/28/2022 Discharge date: 04/30/2022  Admitted From: Home Disposition:  Home  Discharge Condition:Stable CODE STATUS:FULL Diet recommendation: Heart Healthy   Brief/Interim Summary: 71 year old homeless male with PMH of HTN, DM2, COPD, Diastolic HF, Hepatic Cirrhosis, and Morbid Obesity (BMI 43 kg/m2) who presented to the ED on 9/2 with right sided numbness and weakness which are chronic neurological complaints.  MRI of the brain showed no acute abnormality.  Patient does endorse passing out three times this past month. The description is not consistent with seizures.  03/25/22 Echo showed normal EF 60-65% and normal aortic valve.  Cardiology also consulted here, recommended outpatient Holter monitor placement.  Following problems were addressed during his hospitalization:  Acute Right Sided Numbness:  -Cud be from cervical radiculopathy. - MRI Brain showed no acute abnormality. - MRI of the cervical spine showed mod-severe C4-C6 foraminal stenosis.   Recurrent Syncope: - 03/25/22 Echo showed no acute abnormality. -  Cardiology recommended outpatient    Liver Cirrhosis: -Stable.  Follow-up with hepatology as an outpatient - Hold Lactulose as patient has chronic diarrhea and his mental status is at baseline.   Depression Paxil   Chronic diastolic CHF (congestive heart failure) (HCC) - Continue ACE and Lasix    Obesity BMI 43.5 - Counseled on weight loss through diet and lifestyle modifications.   Essential Hypertension: BP is stable. - Continue ACE.   Homelessness TOC consult.Referral to shelter    Discharge Diagnoses:  Principal Problem:   TIA (transient ischemic attack) Active Problems:   Thrombocytopenia (HCC)   Homelessness   HTN (hypertension)   Syncope   Obesity   Chronic diastolic CHF (congestive heart failure) ([facility])   Depression    Right sided weakness    Discharge Instructions  Discharge Instructions     Diet - low sodium heart healthy   Complete by: As directed    Discharge instructions   Complete by: As directed    1)Please follow-up with cardiology tomorrow for Holter monitor placement 2)Follow up with your PCP as an outpatient.   Increase activity slowly   Complete by: As directed       Allergies as of 04/30/2022       Reactions   Codeine Itching, Swelling   Rash, Swelling of face/tongue/legs, itching   Novocain [procaine]    Sulfa Antibiotics Other (See Comments)        Medication List     STOP taking these medications    cetirizine 10 MG tablet Commonly known as: ZyrTEC Allergy   cyclobenzaprine 5 MG tablet Commonly known as: FLEXERIL   omeprazole 40 MG capsule Commonly known as: PRILOSEC   sucralfate 1 GM/10ML suspension Commonly known as: CARAFATE       TAKE these medications    albuterol (2.5 MG/3ML) 0.083% nebulizer solution Commonly known as: PROVENTIL Take 3 mLs by nebulization every 4 (four) hours as needed for wheezing or shortness of breath.   albuterol 108 (90 Base) MCG/ACT inhaler Commonly known as: VENTOLIN HFA Inhale 2 puffs into the lungs every 6 (six) hours as needed for wheezing or shortness of breath.   ascorbic acid 500 MG tablet Commonly known as: VITAMIN C Take 1 tablet (500 mg total) by mouth daily.   aspirin EC 81 MG tablet Take 1 tablet (81 mg total) by mouth daily. Swallow whole.   atorvastatin 40 MG tablet Commonly known as: LIPITOR Take  1 tablet (40 mg total) by mouth daily.   buprenorphine-naloxone 2-0.5 mg Subl SL tablet Commonly known as: SUBOXONE Place 1 tablet under the tongue in the morning and at bedtime. [name]   EPINEPHrine 0.3 mg/0.3 mL Soaj injection Commonly known as: EPI-PEN Inject 0.3 mg into the muscle as needed for anaphylaxis.   fluticasone 50 MCG/ACT nasal spray Commonly known as: FLONASE Place 1 spray into  both nostrils daily.   furosemide 80 MG tablet Commonly known as: LASIX Take 1 tablet (80 mg total) by mouth daily.   glucosamine-chondroitin 500-400 MG tablet Take 1 tablet by mouth 3 (three) times daily.   Jardiance 10 MG Tabs tablet Generic drug: empagliflozin Take 1 tablet (10 mg total) by mouth daily.   lisinopril 40 MG tablet Commonly known as: ZESTRIL Take 1 tablet (40 mg total) by mouth daily.   metFORMIN 500 MG 24 hr tablet Commonly known as: GLUCOPHAGE-XR Take 1 tablet (500 mg total) by mouth daily.   multivitamin with minerals Tabs tablet Take 1 tablet by mouth daily.   nitroGLYCERIN 0.4 MG SL tablet Commonly known as: NITROSTAT Place 1 tablet (0.4 mg total) under the tongue every 5 (five) minutes x 3 doses as needed for chest pain.   PARoxetine 10 MG tablet Commonly known as: PAXIL Take 1 tablet (10 mg total) by mouth daily.   tamsulosin 0.4 MG Caps capsule Commonly known as: FLOMAX Take 0.4 mg by mouth daily.        Follow-up Information     Denton Lank, MD. Schedule an appointment as soon as possible for a visit in 1 week(s).   Specialty: Family Medicine Contact information: [address] [address] [phone]         Lujean Amel D, MD. Schedule an appointment as soon as possible for a visit in 1 day(s).   Specialties: Cardiology, Internal Medicine Why: Call tomorrow for arrangement of Holter monitoring Contact information: [place] [address] [phone]                Allergies  Allergen Reactions   Codeine Itching and Swelling    Rash, Swelling of face/tongue/legs, itching   Novocain [Procaine]    Sulfa Antibiotics Other (See Comments)    Consultations: Cardiology   Procedures/Studies: MR CERVICAL SPINE WO CONTRAST  Result Date: 04/28/2022 CLINICAL DATA:  71 year old male with neurologic deficit. Right side weakness. Extremity pain. Known Cirrhosis. EXAM: MRI  CERVICAL SPINE WITHOUT CONTRAST TECHNIQUE: Multiplanar, multisequence MR imaging of the cervical spine was performed. No intravenous contrast was administered. COMPARISON:  Brain MRI today reported separately. Cervical spine CT 03/10/2022. FINDINGS: Alignment: Stable mild straightening of cervical lordosis. Vertebrae: Chronic C6-C7 ankylosis. No marrow edema or evidence of acute osseous abnormality. T3 benign appearing vertebral body hemangioma. Cord: Limited cervical spinal cord detail despite repeated sagittal and axial imaging attempts. No convincing cord edema, but difficult to exclude myelomalacia. See spinal stenosis details below. Posterior Fossa, vertebral arteries, paraspinal tissues: Cervicomedullary junction is within normal limits. Brain is reported separately today. Neck soft tissues degraded by motion. Disc levels: C2-C3: Mild disc and endplate degeneration. No significant stenosis. C3-C4: Chronic disc space loss. Circumferential disc osteophyte complex with posterior element hypertrophy. At least mild spinal stenosis. Moderate to severe C4 foraminal stenosis, greater on the right on the CT in July. C4-C5: Disc space loss. Broad-based posterior disc osteophyte complex with some posterior element hypertrophy. Possible moderate spinal stenosis here and associated cord mass effect. Moderate  to severe bilateral C5 foraminal stenosis better demonstrated in July. C5-C6: Similar bulky disc osteophyte complex and spinal stenosis which could be up to moderate. Suspected cord mass effect. Moderate to severe bilateral C6 foraminal stenosis better demonstrated in July. C6-C7:  Chronic interbody ankylosis. C7-T1:  Mild to moderate facet hypertrophy.  No definite stenosis. IMPRESSION: 1. Motion degraded exam with no evidence of acute osseous abnormality. Chronic C6-C7 ankylosis. 2. Bulky chronic disc and endplate degeneration, also demonstrated by CT in July. Subsequent cervical spinal stenosis from C3-C4 through  C5-C6 could be MODERATE, along with spinal cord mass effect. But spinal cord signal is poorly evaluated on this exam. 3. 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 Electronically Signed   By: Genevie Ann M.D.   On: 04/28/2022 11:03   MR BRAIN WO CONTRAST  Result Date: 04/28/2022 CLINICAL DATA:  71 year old male with neurologic deficit. Right side weakness. Extremity pain. Known Cirrhosis. EXAM: MRI HEAD WITHOUT CONTRAST TECHNIQUE: Multiplanar, multiecho pulse sequences of the brain and surrounding structures were obtained without intravenous contrast. COMPARISON:  Head CT 0905 hours today.  Brain MRI 07/31/2021. FINDINGS: Brain: No restricted diffusion to suggest acute infarction. No midline shift, mass effect, evidence of mass lesion, ventriculomegaly, extra-axial collection or acute intracranial hemorrhage. Cervicomedullary junction and pituitary are within normal limits. Stable gray and white matter signal throughout the brain since last year. No cortical encephalomalacia, chronic cerebral blood products, or white matter disease. Intrinsic globus pallidus T1 hyperintensity re-demonstrated. Vascular: Major intracranial vascular flow voids are stable since last year. Skull and upper cervical spine: Cervical spine is detailed separately today. Visualized bone marrow signal is within normal limits. Sinuses/Orbits: Stable and negative. Other: Mastoids remain clear. IMPRESSION: No acute intracranial abnormality. Stable noncontrast MRI appearance of the brain since last year, remarkable for cirrhosis related basal ganglia signal changes. Correlation with serum ammonia level may be valuable. Electronically Signed   By: Genevie Ann M.D.   On: 04/28/2022 10:57   CT Head Wo Contrast  Result Date: 04/28/2022 CLINICAL DATA:  Neuro deficit.  Stroke suspected. EXAM: CT HEAD WITHOUT CONTRAST TECHNIQUE: Contiguous axial images were obtained from the base of the skull through the vertex without  intravenous contrast. RADIATION DOSE REDUCTION: This exam was performed according to the departmental dose-optimization program which includes automated exposure control, adjustment of the mA and/or kV according to patient size and/or use of iterative reconstruction technique. COMPARISON:  03/21/2022 FINDINGS: Brain: There is no evidence for acute hemorrhage, hydrocephalus, mass lesion, or abnormal extra-axial fluid collection. No definite CT evidence for acute infarction. Vascular: No hyperdense vessel or unexpected calcification. Skull: No evidence for fracture. No worrisome lytic or sclerotic lesion. Sinuses/Orbits: The visualized paranasal sinuses and mastoid air cells are clear. Visualized portions of the globes and intraorbital fat are unremarkable. Other: None. IMPRESSION: Stable.  No acute intracranial abnormality. Electronically Signed   By: Misty Stanley M.D.   On: 04/28/2022 09:12   DG Chest 2 View  Result Date: 04/27/2022 CLINICAL DATA:  Chest pain EXAM: CHEST - 2 VIEW COMPARISON:  Radiograph 04/26/2022 FINDINGS: The cardiomediastinal silhouette is within normal limits. There is no focal airspace consolidation. There is no pleural effusion. No pneumothorax. No acute osseous abnormality. Thoracic spondylosis. IMPRESSION: No evidence of acute cardiopulmonary disease. Electronically Signed   By: Maurine Simmering M.D.   On: 04/27/2022 21:15   DG Chest 2 View  Result Date: 04/26/2022 CLINICAL DATA:  Chest pain, shortness of breath EXAM: CHEST - 2 VIEW COMPARISON:  04/06/2022 FINDINGS: The heart size and mediastinal contours are within normal limits. Both lungs are clear. The visualized skeletal structures are unremarkable. IMPRESSION: No active cardiopulmonary disease. Electronically Signed   By: Inez Catalina M.D.   On: 04/26/2022 03:41   DG Shoulder Right  Result Date: 04/20/2022 CLINICAL DATA:  Fall, right shoulder pain EXAM: RIGHT SHOULDER - 2+ VIEW COMPARISON:  None Available. FINDINGS: There is no  evidence of fracture or dislocation. There is no evidence of arthropathy or other focal bone abnormality. Soft tissues are unremarkable. IMPRESSION: Negative. Electronically Signed   By: Rolm Baptise M.D.   On: 04/20/2022 01:27   DG Knee Complete 4 Views Left  Result Date: 04/20/2022 CLINICAL DATA:  Fall, left knee pain EXAM: LEFT KNEE - COMPLETE 4+ VIEW COMPARISON:  None Available. FINDINGS: Mild degenerative changes in the medial and patellofemoral compartments. No acute bony abnormality. Specifically, no fracture, subluxation, or dislocation. No joint effusion. IMPRESSION: No acute bony abnormality. Electronically Signed   By: Rolm Baptise M.D.   On: 04/20/2022 01:27   DG Chest 2 View  Result Date: 04/06/2022 CLINICAL DATA:  Congestive heart failure and swelling EXAM: CHEST - 2 VIEW COMPARISON:  Radiographs 03/24/2022 FINDINGS: No focal consolidation, pleural effusion, or pneumothorax. Decreased interstitial opacities from 03/24/2022. Normal cardiomediastinal silhouette. No acute osseous abnormality. Aortic calcification. IMPRESSION: Decreased interstitial opacities from 03/24/2022 favored to represent improving edema. Electronically Signed   By: Placido Sou M.D.   On: 04/06/2022 17:32      Subjective: Patient seen and examined at the bedside this morning.  Hemodynamically stable for discharge today.  No new complaints  Discharge Exam: Vitals:   04/30/22 0526 04/30/22 0842  BP: (!) 147/81 133/68  Pulse: (!) 58 (!) 53  Resp: 16 16  Temp: 98.8 F (37.1 C) 98.5 F (36.9 C)  SpO2: 98% 99%   Vitals:   04/29/22 2052 04/30/22 0049 04/30/22 0526 04/30/22 0842  BP: 122/67 (!) 171/70 (!) 147/81 133/68  Pulse: (!) 59 (!) 58 (!) 58 (!) 53  Resp: '17 18 16 16  '$ Temp: 99.7 F (37.6 C) 98.3 F (36.8 C) 98.8 F (37.1 C) 98.5 F (36.9 C)  TempSrc: Oral     SpO2: 99% 100% 98% 99%  Weight:      Height:        General: Pt is alert, awake, not in acute distress, morbidly  obese Cardiovascular: RRR, S1/S2 +, no rubs, no gallops Respiratory: CTA bilaterally, no wheezing, no rhonchi Abdominal: Soft, NT, ND, bowel sounds + Extremities: Trace bilateral lower extremity edema, no cyanosis    The results of significant diagnostics from this hospitalization (including imaging, microbiology, ancillary and laboratory) are listed below for reference.     Microbiology: Recent Results (from the past 240 hour(s))  Resp Panel by RT-PCR (Flu A&B, Covid) Anterior Nasal Swab     Status: None   Collection Time: 04/26/22  8:02 AM   Specimen: Anterior Nasal Swab  Result Value Ref Range Status   SARS Coronavirus 2 by RT PCR NEGATIVE NEGATIVE Final    Comment: (NOTE) SARS-CoV-2 target nucleic acids are NOT DETECTED.  The SARS-CoV-2 RNA is generally detectable in upper respiratory specimens during the acute phase of infection. The lowest concentration of SARS-CoV-2 viral copies this assay can detect is 138 copies/mL. A negative result does not preclude SARS-Cov-2 infection and should not be used as the sole basis for treatment or other patient management decisions. A negative result may occur with  improper specimen  collection/handling, submission of specimen other than nasopharyngeal swab, presence of viral mutation(s) within the areas targeted by this assay, and inadequate number of viral copies(<138 copies/mL). A negative result must be combined with clinical observations, patient history, and epidemiological information. The expected result is Negative.  Fact Sheet for Patients:  EntrepreneurPulse.com.au  Fact Sheet for Healthcare Providers:  IncredibleEmployment.be  This test is no t yet approved or cleared by the [place] FDA and  has been authorized for detection and/or diagnosis of SARS-CoV-2 by FDA under an Emergency Use Authorization (EUA). This EUA will remain  in effect (meaning this test can be used) for the  duration of the COVID-19 declaration under Section 564(b)(1) of the Act, 21 U.S.C.section 360bbb-3(b)(1), unless the authorization is terminated  or revoked sooner.       Influenza A by PCR NEGATIVE NEGATIVE Final   Influenza B by PCR NEGATIVE NEGATIVE Final    Comment: (NOTE) The Xpert Xpress SARS-CoV-2/FLU/RSV plus assay is intended as an aid in the diagnosis of influenza from Nasopharyngeal swab specimens and should not be used as a sole basis for treatment. Nasal washings and aspirates are unacceptable for Xpert Xpress SARS-CoV-2/FLU/RSV testing.  Fact Sheet for Patients: EntrepreneurPulse.com.au  Fact Sheet for Healthcare Providers: IncredibleEmployment.be  This test is not yet approved or cleared by the [place] FDA and has been authorized for detection and/or diagnosis of SARS-CoV-2 by FDA under an Emergency Use Authorization (EUA). This EUA will remain in effect (meaning this test can be used) for the duration of the COVID-19 declaration under Section 564(b)(1) of the Act, 21 U.S.C. section 360bbb-3(b)(1), unless the authorization is terminated or revoked.  Performed at [facility], [address] [place], [place] [address]      Labs: BNP (last 3 results) Recent Labs    03/24/22 2338 04/26/22 0307 04/27/22 2035  BNP 460.1* 446.2* 510.2*   Basic Metabolic Panel: Recent Labs  Lab 04/26/22 0307 04/27/22 2035 04/30/22 0511  NA 139 139 138  K 4.4 3.7 3.6  CL 109 108 107  CO2 '25 26 23  '$ GLUCOSE 105* 115* 87  BUN '13 12 11  '$ CREATININE 0.92 0.92 0.95  CALCIUM 9.1 9.1 8.5*   Liver Function Tests: No results for input(s): "AST", "ALT", "ALKPHOS", "BILITOT", "PROT", "ALBUMIN" in the last 168 hours. No results for input(s): "LIPASE", "AMYLASE" in the last 168 hours. Recent Labs  Lab 04/29/22 0930  AMMONIA 70*   CBC: Recent Labs  Lab 04/26/22 0307 04/27/22 2035 04/30/22 0511  WBC 3.9* 3.5* 2.2*  HGB  10.8* 10.5* 10.2*  HCT 33.4* 32.4* 31.5*  MCV 91.8 91.0 90.8  PLT 112* 95* 82*   Cardiac Enzymes: No results for input(s): "CKTOTAL", "CKMB", "CKMBINDEX", "TROPONINI" in the last 168 hours. BNP: Invalid input(s): "POCBNP" CBG: Recent Labs  Lab 04/29/22 0629 04/29/22 0824 04/29/22 1156 04/29/22 1625 04/30/22 0840  GLUCAP 87 88 123* 131* 140*   D-Dimer No results for input(s): "DDIMER" in the last 72 hours. Hgb A1c No results for input(s): "HGBA1C" in the last 72 hours. Lipid Profile Recent Labs    04/29/22 0401  CHOL 92  HDL 25*  LDLCALC 57  TRIG 51  CHOLHDL 3.7   Thyroid function studies No results for input(s): "TSH", "T4TOTAL", "T3FREE", "THYROIDAB" in the last 72 hours.  Invalid input(s): "FREET3" Anemia work up No results for input(s): "VITAMINB12", "FOLATE", "FERRITIN", "TIBC", "IRON", "RETICCTPCT" in the last 72 hours. Urinalysis    Component Value Date/Time   COLORURINE AMBER (A)  04/19/2022 2129   APPEARANCEUR CLEAR (A) 04/19/2022 2129   LABSPEC 1.026 04/19/2022 2129   PHURINE 6.0 04/19/2022 2129   GLUCOSEU NEGATIVE 04/19/2022 2129   HGBUR NEGATIVE 04/19/2022 2129   BILIRUBINUR SMALL (A) 04/19/2022 2129   KETONESUR NEGATIVE 04/19/2022 2129   PROTEINUR 100 (A) 04/19/2022 2129   NITRITE NEGATIVE 04/19/2022 2129   LEUKOCYTESUR NEGATIVE 04/19/2022 2129   Sepsis Labs Recent Labs  Lab 04/26/22 0307 04/27/22 2035 04/30/22 0511  WBC 3.9* 3.5* 2.2*   Microbiology Recent Results (from the past 240 hour(s))  Resp Panel by RT-PCR (Flu A&B, Covid) Anterior Nasal Swab     Status: None   Collection Time: 04/26/22  8:02 AM   Specimen: Anterior Nasal Swab  Result Value Ref Range Status   SARS Coronavirus 2 by RT PCR NEGATIVE NEGATIVE Final    Comment: (NOTE) SARS-CoV-2 target nucleic acids are NOT DETECTED.  The SARS-CoV-2 RNA is generally detectable in upper respiratory specimens during the acute phase of infection. The lowest concentration of  SARS-CoV-2 viral copies this assay can detect is 138 copies/mL. A negative result does not preclude SARS-Cov-2 infection and should not be used as the sole basis for treatment or other patient management decisions. A negative result may occur with  improper specimen collection/handling, submission of specimen other than nasopharyngeal swab, presence of viral mutation(s) within the areas targeted by this assay, and inadequate number of viral copies(<138 copies/mL). A negative result must be combined with clinical observations, patient history, and epidemiological information. The expected result is Negative.  Fact Sheet for Patients:  EntrepreneurPulse.com.au  Fact Sheet for Healthcare Providers:  IncredibleEmployment.be  This test is no t yet approved or cleared by the [place] FDA and  has been authorized for detection and/or diagnosis of SARS-CoV-2 by FDA under an Emergency Use Authorization (EUA). This EUA will remain  in effect (meaning this test can be used) for the duration of the COVID-19 declaration under Section 564(b)(1) of the Act, 21 U.S.C.section 360bbb-3(b)(1), unless the authorization is terminated  or revoked sooner.       Influenza A by PCR NEGATIVE NEGATIVE Final   Influenza B by PCR NEGATIVE NEGATIVE Final    Comment: (NOTE) The Xpert Xpress SARS-CoV-2/FLU/RSV plus assay is intended as an aid in the diagnosis of influenza from Nasopharyngeal swab specimens and should not be used as a sole basis for treatment. Nasal washings and aspirates are unacceptable for Xpert Xpress SARS-CoV-2/FLU/RSV testing.  Fact Sheet for Patients: EntrepreneurPulse.com.au  Fact Sheet for Healthcare Providers: IncredibleEmployment.be  This test is not yet approved or cleared by the [place] FDA and has been authorized for detection and/or diagnosis of SARS-CoV-2 by FDA under an Emergency Use  Authorization (EUA). This EUA will remain in effect (meaning this test can be used) for the duration of the COVID-19 declaration under Section 564(b)(1) of the Act, 21 U.S.C. section 360bbb-3(b)(1), unless the authorization is terminated or revoked.  Performed at [facility], [address], [place], [place] [address]     Please note: You were cared for by a hospitalist during your hospital stay. Once you are discharged, your primary care physician will handle any further medical issues. Please note that NO REFILLS for any discharge medications will be authorized once you are discharged, as it is imperative that you return to your primary care physician (or establish a relationship with a primary care physician if you do not have one) for your post hospital discharge needs so that they can reassess  your need for medications and monitor your lab values.    Time coordinating discharge: 40 minutes  SIGNED:   Shelly Coss, MD  [facility] 04/30/2022, 11:53 AM Pager [phone]  If 7PM-7AM, please contact night-coverage [email or web address] Password [email or web address]

## 2022-04-30 NOTE — CV Procedure (Signed)
Request for Holter monitor was made Fortunately the hospital is unable to provide someone to place the Holter Because it is holiday there is no one in the office to provide 1 as well Advised team to arrange to have him come to the office tomorrow morning and we can place it at that time At [facility] cardi[facility]  Thanks,  Lujean Amel MD Cardiology

## 2022-05-01 ENCOUNTER — Encounter: Payer: Self-pay | Admitting: Emergency Medicine

## 2022-05-01 ENCOUNTER — Other Ambulatory Visit: Payer: Self-pay

## 2022-05-01 DIAGNOSIS — R519 Headache, unspecified: Secondary | ICD-10-CM | POA: Insufficient documentation

## 2022-05-01 DIAGNOSIS — R209 Unspecified disturbances of skin sensation: Secondary | ICD-10-CM | POA: Diagnosis not present

## 2022-05-01 MED ORDER — ONDANSETRON 4 MG PO TBDP
4.0000 mg | ORAL_TABLET | Freq: Once | ORAL | Status: AC
  Administered 2022-05-01: 4 mg via ORAL
  Filled 2022-05-01: qty 1

## 2022-05-01 MED ORDER — ACETAMINOPHEN 500 MG PO TABS
1000.0000 mg | ORAL_TABLET | Freq: Once | ORAL | Status: AC
  Administered 2022-05-01: 1000 mg via ORAL
  Filled 2022-05-01: qty 2

## 2022-05-01 MED ORDER — KETOROLAC TROMETHAMINE 30 MG/ML IJ SOLN
30.0000 mg | Freq: Once | INTRAMUSCULAR | Status: AC
  Administered 2022-05-02: 30 mg via INTRAMUSCULAR
  Filled 2022-05-01: qty 1

## 2022-05-01 NOTE — ED Triage Notes (Signed)
Pt arrived via [facility], pt states [place] was asleep [place] and woke up with HA, pt ambulatory in triage, no distress noted.

## 2022-05-01 NOTE — ED Provider Notes (Signed)
[facility] Provider Note    Event Date/Time   First MD Initiated Contact with Patient 05/01/22 2350     (approximate)   History   Headache   HPI  [name] is a 71 y.o. male who presents to the ED for evaluation of Headache   I reviewed medical DC summary from yesterday where patient was evaluated for right-sided numbness, had MRIs without acute intracranial pathology.  Benign work-up and discharged.  Patient awoke this evening from sleep with a bad headache.  He reports a history of migraines since he was a child and this feels similar.  He has not taken any medications.  He called 911 and he ambulates into triage with no acute distress.  Denies any novel features.  Does report photophobia, which is typical.  No syncope, illnesses, trauma or injuries.  Physical Exam   Triage Vital Signs: ED Triage Vitals  Enc Vitals Group     BP      Pulse      Resp      Temp      Temp src      SpO2      Weight      Height      Head Circumference      Peak Flow      Pain Score      Pain Loc      Pain Edu?      Excl. in [place]?     Most recent vital signs: Vitals:   05/01/22 2353  BP: (!) 140/61  Pulse: (!) 57  Resp: 18  Temp: 98.2 F (36.8 C)  SpO2: 94%    General: Awake, no distress.  Ambulatory independently. CV:  Good peripheral perfusion.  Resp:  Normal effort.  Abd:  No distention.  MSK:  No deformity noted.  Neuro:  No focal deficits appreciated. Cranial nerves II through XII intact 5/5 strength and sensation in all 4 extremities Other:     ED Results / Procedures / Treatments   Labs (all labs ordered are listed, but only abnormal results are displayed) Labs Reviewed - No data to display  EKG   RADIOLOGY   Official radiology report(s): No results found.  PROCEDURES and INTERVENTIONS:  Procedures  Medications  acetaminophen (TYLENOL) tablet 1,000 mg (1,000 mg Oral Given 05/01/22 2359)  ketorolac (TORADOL) 30 MG/ML  injection 30 mg (30 mg Intramuscular Given 05/02/22 0000)  ondansetron (ZOFRAN-ODT) disintegrating tablet 4 mg (4 mg Oral Given 05/01/22 2359)     IMPRESSION / MDM / ASSESSMENT AND PLAN / ED COURSE  I reviewed the triage vital signs and the nursing notes.  Differential diagnosis includes, but is not limited to, stroke, seizure, ICH, migraine, tension headache, meningitis  {Patient presents with symptoms of an acute illness or injury that is potentially life-threatening.  71 year old male with recent extensive intracranial work-up presents to the ED with a headache without signs of acute neurologic or vascular deficits.  Systemically well.  Considering his recent and extensive work-up I see no indications for further diagnostics or CNS imaging considering the typical nature of his symptoms.  We will provide nonnarcotic multimodal analgesia and discharged with return precautions.      FINAL CLINICAL IMPRESSION(S) / ED DIAGNOSES   Final diagnoses:  Bad headache     Rx / DC Orders   ED Discharge Orders     None        Note:  This document was prepared using Dragon  voice recognition software and may include unintentional dictation errors.   Vladimir Crofts, MD 05/02/22 [phone]

## 2022-05-02 ENCOUNTER — Emergency Department
Admission: EM | Admit: 2022-05-02 | Discharge: 2022-05-02 | Disposition: A | Discharge status: Home / Self Care | Payer: Medicare Other | Attending: Emergency Medicine | Admitting: Emergency Medicine

## 2022-05-02 DIAGNOSIS — R519 Headache, unspecified: Secondary | ICD-10-CM | POA: Diagnosis not present

## 2022-05-02 NOTE — Discharge Instructions (Signed)
Use Tylenol for pain and fevers.  Up to 1000 mg per dose, up to 4 times per day.  Do not take more than 4000 mg of Tylenol/acetaminophen within 24 hours..  Use naproxen/Aleve for anti-inflammatory pain relief. Use up to 500mg every 12 hours. Do not take more frequently than this. Do not use other NSAIDs (ibuprofen, Advil) while taking this medication. It is safe to take Tylenol with this.   

## 2022-05-03 ENCOUNTER — Ambulatory Visit: Payer: Medicare Other | Admitting: Family

## 2022-05-10 ENCOUNTER — Encounter: Payer: Self-pay | Admitting: *Deleted

## 2022-05-10 ENCOUNTER — Emergency Department: Payer: Medicare Other

## 2022-05-10 ENCOUNTER — Emergency Department
Admission: EM | Admit: 2022-05-10 | Discharge: 2022-05-11 | Disposition: A | Discharge status: Home / Self Care | Payer: Medicare Other | Attending: Emergency Medicine | Admitting: Emergency Medicine

## 2022-05-10 ENCOUNTER — Other Ambulatory Visit: Payer: Self-pay

## 2022-05-10 DIAGNOSIS — J449 Chronic obstructive pulmonary disease, unspecified: Secondary | ICD-10-CM | POA: Insufficient documentation

## 2022-05-10 DIAGNOSIS — Z7982 Long term (current) use of aspirin: Secondary | ICD-10-CM | POA: Diagnosis not present

## 2022-05-10 DIAGNOSIS — R197 Diarrhea, unspecified: Secondary | ICD-10-CM | POA: Diagnosis not present

## 2022-05-10 DIAGNOSIS — E119 Type 2 diabetes mellitus without complications: Secondary | ICD-10-CM | POA: Diagnosis not present

## 2022-05-10 DIAGNOSIS — R1084 Generalized abdominal pain: Secondary | ICD-10-CM | POA: Insufficient documentation

## 2022-05-10 DIAGNOSIS — I509 Heart failure, unspecified: Secondary | ICD-10-CM | POA: Insufficient documentation

## 2022-05-10 DIAGNOSIS — I110 Hypertensive heart disease with heart failure: Secondary | ICD-10-CM | POA: Diagnosis not present

## 2022-05-10 DIAGNOSIS — Z79899 Other long term (current) drug therapy: Secondary | ICD-10-CM | POA: Diagnosis not present

## 2022-05-10 DIAGNOSIS — R0602 Shortness of breath: Secondary | ICD-10-CM | POA: Insufficient documentation

## 2022-05-10 DIAGNOSIS — R112 Nausea with vomiting, unspecified: Secondary | ICD-10-CM | POA: Insufficient documentation

## 2022-05-10 DIAGNOSIS — Z7984 Long term (current) use of oral hypoglycemic drugs: Secondary | ICD-10-CM | POA: Diagnosis not present

## 2022-05-10 DIAGNOSIS — Z8616 Personal history of COVID-19: Secondary | ICD-10-CM | POA: Insufficient documentation

## 2022-05-10 DIAGNOSIS — J45909 Unspecified asthma, uncomplicated: Secondary | ICD-10-CM | POA: Diagnosis not present

## 2022-05-10 DIAGNOSIS — R0789 Other chest pain: Secondary | ICD-10-CM

## 2022-05-10 LAB — COMPREHENSIVE METABOLIC PANEL
ALT: 27 U/L (ref 0–44)
AST: 50 U/L — ABNORMAL HIGH (ref 15–41)
Albumin: 2.8 g/dL — ABNORMAL LOW (ref 3.5–5.0)
Alkaline Phosphatase: 93 U/L (ref 38–126)
Anion gap: 8 (ref 5–15)
BUN: 16 mg/dL (ref 8–23)
CO2: 24 mmol/L (ref 22–32)
Calcium: 9.3 mg/dL (ref 8.9–10.3)
Chloride: 110 mmol/L (ref 98–111)
Creatinine, Ser: 1.08 mg/dL (ref 0.61–1.24)
GFR, Estimated: >60 mL/min (ref >60)
Glucose, Bld: 123 mg/dL — ABNORMAL HIGH (ref 70–99)
Potassium: 3.6 mmol/L (ref 3.5–5.1)
Sodium: 142 mmol/L (ref 135–145)
Total Bilirubin: 1.6 mg/dL — ABNORMAL HIGH (ref 0.3–1.2)
Total Protein: 7.5 g/dL (ref 6.5–8.1)

## 2022-05-10 LAB — URINALYSIS, ROUTINE W REFLEX MICROSCOPIC
Bacteria, UA: NONE SEEN
Bilirubin Urine: NEGATIVE
Glucose, UA: >=500 mg/dL — AB
Hgb urine dipstick: NEGATIVE
Ketones, ur: NEGATIVE mg/dL
Leukocytes,Ua: NEGATIVE
Nitrite: NEGATIVE
Protein, ur: 100 mg/dL — AB
Specific Gravity, Urine: 1.025 (ref 1.005–1.030)
Squamous Epithelial / HPF: NONE SEEN (ref 0–5)
pH: 6 (ref 5.0–8.0)

## 2022-05-10 LAB — CBC
HCT: 35.5 % — ABNORMAL LOW (ref 39.0–52.0)
Hemoglobin: 11.4 g/dL — ABNORMAL LOW (ref 13.0–17.0)
MCH: 29.2 pg (ref 26.0–34.0)
MCHC: 32.1 g/dL (ref 30.0–36.0)
MCV: 91 fL (ref 80.0–100.0)
Platelets: 145 10*3/uL — ABNORMAL LOW (ref 150–400)
RBC: 3.9 MIL/uL — ABNORMAL LOW (ref 4.22–5.81)
RDW: 17.3 % — ABNORMAL HIGH (ref 11.5–15.5)
WBC: 4.2 10*3/uL (ref 4.0–10.5)
nRBC: 0 % (ref 0.0–0.2)

## 2022-05-10 LAB — LIPASE, BLOOD: Lipase: 56 U/L — ABNORMAL HIGH (ref 11–51)

## 2022-05-10 MED ORDER — ONDANSETRON HCL 4 MG/2ML IJ SOLN
4.0000 mg | Freq: Once | INTRAMUSCULAR | Status: AC
  Administered 2022-05-11: 4 mg via INTRAVENOUS
  Filled 2022-05-10: qty 2

## 2022-05-10 MED ORDER — SODIUM CHLORIDE 0.9 % IV BOLUS (SEPSIS)
1000.0000 mL | Freq: Once | INTRAVENOUS | Status: AC
  Administered 2022-05-11: 1000 mL via INTRAVENOUS

## 2022-05-10 MED ORDER — KETOROLAC TROMETHAMINE 30 MG/ML IJ SOLN
15.0000 mg | Freq: Once | INTRAMUSCULAR | Status: AC
  Administered 2022-05-11: 15 mg via INTRAVENOUS
  Filled 2022-05-10: qty 1

## 2022-05-10 NOTE — ED Triage Notes (Signed)
Pt ate a sandwich 1 hour ago and then began having vomiting and diarrhea.  .  Pt has abd pain   pt alert  speech clear.

## 2022-05-10 NOTE — ED Provider Notes (Signed)
[facility] Provider Note    Event Date/Time   First MD Initiated Contact with Patient 05/10/22 2303     (approximate)   History   Emesis   HPI  [name] is a 71 y.o. male with history of COPD, CHF, hypertension, diabetes, homelessness who presents to the emergency department with complaints of generalized abdominal pain, nausea, vomiting and diarrhea that started today.  Also complains of chest discomfort which seems to be chronic for him and shortness of breath.  No fevers.  States he also reports he is having hard time with urinating but he is able to urinate is just hard to start a stream.  No pain with urination or hematuria.  He has had previous cholecystectomy.   History provided by patient.    Past Medical History:  Diagnosis Date   Asthma    CHF (congestive heart failure) (HCC)    COPD (chronic obstructive pulmonary disease) ([place])    COVID-19 03/2020   diagnosed in August 2021   Diabetes mellitus without complication ([place])    Homelessness    Hypertension    Infestation by bed bug    Migraine    Obesity    Sleep apnea     Past Surgical History:  Procedure Laterality Date   CARDIAC CATHETERIZATION     CHOLECYSTECTOMY     EYE SURGERY     INNER EAR SURGERY     NOSE SURGERY      MEDICATIONS:  Prior to Admission medications   Medication Sig Start Date End Date Taking? Authorizing Provider  albuterol (PROVENTIL) (2.5 MG/3ML) 0.083% nebulizer solution Take 3 mLs by nebulization every 4 (four) hours as needed for wheezing or shortness of breath. Patient not taking: Reported on 04/26/2022 03/10/22 04/09/22  Vanessa [place], MD  albuterol (VENTOLIN HFA) 108 (90 Base) MCG/ACT inhaler Inhale 2 puffs into the lungs every 6 (six) hours as needed for wheezing or shortness of breath. 04/26/22 05/26/22  Alisa Graff, FNP  ascorbic acid (VITAMIN C) 500 MG tablet Take 1 tablet (500 mg total) by mouth daily. Patient not taking: Reported on  04/26/2022 04/20/20   Samuella Cota, MD  aspirin EC 81 MG tablet Take 1 tablet (81 mg total) by mouth daily. Swallow whole. 04/26/22   Alisa Graff, FNP  atorvastatin (LIPITOR) 40 MG tablet Take 1 tablet (40 mg total) by mouth daily. 04/26/22   Alisa Graff, FNP  buprenorphine-naloxone (SUBOXONE) 2-0.5 mg SUBL SL tablet Place 1 tablet under the tongue in the morning and at bedtime. Juanda Crumble drew Patient not taking: Reported on 04/26/2022    [provider]  EPINEPHrine 0.3 mg/0.3 mL IJ SOAJ injection Inject 0.3 mg into the muscle as needed for anaphylaxis. Patient not taking: Reported on 04/26/2022    [provider]  fluticasone (FLONASE) 50 MCG/ACT nasal spray Place 1 spray into both nostrils daily. Patient not taking: Reported on 04/26/2022    [provider]  furosemide (LASIX) 80 MG tablet Take 1 tablet (80 mg total) by mouth daily. 04/26/22   Alisa Graff, FNP  glucosamine-chondroitin 500-400 MG tablet Take 1 tablet by mouth 3 (three) times daily. Patient not taking: Reported on 04/26/2022    [provider]  JARDIANCE 10 MG TABS tablet Take 1 tablet (10 mg total) by mouth daily. 04/26/22   Alisa Graff, FNP  lisinopril (ZESTRIL) 40 MG tablet Take 1 tablet (40 mg total) by mouth daily. 04/26/22   Hackney,  Aura Fey, FNP  metFORMIN (GLUCOPHAGE-XR) 500 MG 24 hr tablet Take 1 tablet (500 mg total) by mouth daily. 04/26/22   Alisa Graff, FNP  Multiple Vitamin (MULTIVITAMIN WITH MINERALS) TABS tablet Take 1 tablet by mouth daily. Patient not taking: Reported on 04/26/2022    [provider]  nitroGLYCERIN (NITROSTAT) 0.4 MG SL tablet Place 1 tablet (0.4 mg total) under the tongue every 5 (five) minutes x 3 doses as needed for chest pain. 04/26/22   Alisa Graff, FNP  PARoxetine (PAXIL) 10 MG tablet Take 1 tablet (10 mg total) by mouth daily. 04/26/22   Alisa Graff, FNP  tamsulosin (FLOMAX) 0.4 MG CAPS capsule Take 0.4 mg by mouth daily.     [provider]    Physical Exam   Triage Vital Signs: ED Triage Vitals  Enc Vitals Group     BP 05/10/22 2102 (!) 168/81     Pulse Rate 05/10/22 2102 80     Resp 05/10/22 2102 20     Temp 05/10/22 2102 98.5 F (36.9 C)     Temp Source 05/10/22 2102 Oral     SpO2 05/10/22 2102 96 %     Weight 05/10/22 2102 266 lb (120.7 kg)     Height 05/10/22 2102 '5\' 6"'$  (1.676 m)     Head Circumference --      Peak Flow --      Pain Score 05/10/22 2112 9     Pain Loc --      Pain Edu? --      Excl. in [place]? --     Most recent vital signs: Vitals:   05/10/22 2102 05/11/22 0146  BP: (!) 168/81 (!) 150/61  Pulse: 80 68  Resp: 20 20  Temp: 98.5 F (36.9 C) 98.2 F (36.8 C)  SpO2: 96% 94%    CONSTITUTIONAL: Alert and oriented and responds appropriately to questions.  Chronically ill-appearing HEAD: Normocephalic, atraumatic EYES: Conjunctivae clear, pupils appear equal, sclera nonicteric ENT: normal nose; dry mucous membranes NECK: Supple, normal ROM CARD: RRR; S1 and S2 appreciated; no murmurs, no clicks, no rubs, no gallops RESP: Normal chest excursion without splinting or tachypnea; breath sounds clear and equal bilaterally; no wheezes, no rhonchi, no rales, no hypoxia or respiratory distress, speaking full sentences ABD/GI: Normal bowel sounds; non-distended; soft, diffusely tender to palpation without guarding or rebound, no peritoneal signs BACK: The back appears normal EXT: Normal ROM in all joints; no deformity noted, no edema; no cyanosis SKIN: Normal color for age and race; warm; no rash on exposed skin NEURO: Moves all extremities equally, normal speech PSYCH: The patient's mood and manner are appropriate.   ED Results / Procedures / Treatments   LABS: (all labs ordered are listed, but only abnormal results are displayed) Labs Reviewed  LIPASE, BLOOD - Abnormal; Notable for the following components:      Result Value   Lipase 56 (*)    All other components  within normal limits  COMPREHENSIVE METABOLIC PANEL - Abnormal; Notable for the following components:   Glucose, Bld 123 (*)    Albumin 2.8 (*)    AST 50 (*)    Total Bilirubin 1.6 (*)    All other components within normal limits  CBC - Abnormal; Notable for the following components:   RBC 3.90 (*)    Hemoglobin 11.4 (*)    HCT 35.5 (*)    RDW 17.3 (*)    Platelets 145 (*)    All  other components within normal limits  URINALYSIS, ROUTINE W REFLEX MICROSCOPIC - Abnormal; Notable for the following components:   Color, Urine YELLOW (*)    APPearance CLEAR (*)    Glucose, UA >=500 (*)    Protein, ur 100 (*)    All other components within normal limits  BRAIN NATRIURETIC PEPTIDE - Abnormal; Notable for the following components:   B Natriuretic Peptide 376.7 (*)    All other components within normal limits  TROPONIN I (HIGH SENSITIVITY)  TROPONIN I (HIGH SENSITIVITY)     EKG:  EKG Interpretation  Date/Time:  Thursday May 10 2022 23:46:26 EDT Ventricular Rate:  59 PR Interval:    QRS Duration: 106 QT Interval:  430 QTC Calculation: 425 R Axis:   8 Text Interpretation: Junctional rhythm Abnormal ECG No significant change since last tracing Confirmed by Pryor Curia [phone]) on 05/10/2022 11:55:05 PM         RADIOLOGY: My personal review and interpretation of imaging: Chest x-ray shows no edema, pneumonia.  CT of the abdomen pelvis shows hepatic cirrhosis with small amount of ascites and anasarca.  I have personally reviewed all radiology reports.   CT ABDOMEN PELVIS W CONTRAST  Result Date: 05/11/2022 CLINICAL DATA:  Acute nonlocalized abdominal pain with vomiting and diarrhea EXAM: CT ABDOMEN AND PELVIS WITH CONTRAST TECHNIQUE: Multidetector CT imaging of the abdomen and pelvis was performed using the standard protocol following bolus administration of intravenous contrast. RADIATION DOSE REDUCTION: This exam was performed according to the departmental  dose-optimization program which includes automated exposure control, adjustment of the mA and/or kV according to patient size and/or use of iterative reconstruction technique. CONTRAST:  178m OMNIPAQUE IOHEXOL 300 MG/ML  SOLN COMPARISON:  CT 03/25/2022 FINDINGS: Lower chest: No acute abnormality. Hepatobiliary: Cirrhotic morphology of the liver. No suspicious hepatic lesion. Cholecystectomy. Diffuse biliary ductal dilation is redemonstrated with the common bile duct measuring 21 mm in diameter. Smooth distal tapering is seen. Pancreas: No mass or inflammatory changes. No evidence of ductal dilation. Spleen: Normal in size without focal abnormality. Adrenals/Urinary Tract: Nonobstructing right renal calculi. No hydronephrosis. Unremarkable adrenal glands and bladder. Stomach/Bowel: Unremarkable stomach. Small bowel wall thickening compatible with congestive enteropathy. Normal caliber large and small bowel. Vascular/Lymphatic: Recanalized periumbilical veins. The portal vein is patent. Multiple borderline enlarged lymph nodes throughout the retroperitoneum and mesentery are not significantly changed. Reproductive: Unremarkable. Other: No free intraperitoneal air.  Trace perihepatic ascites. Musculoskeletal: No acute abnormality.  Body wall anasarca. 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 Trace perihepatic ascites. Body wall anasarca. Electronically Signed   By: TPlacido SouM.D.   On: 05/11/2022 01:03   DG Chest Portable 1 View  Result Date: 05/11/2022 CLINICAL DATA:  Chest pain. EXAM: PORTABLE CHEST 1 VIEW COMPARISON:  April 27, 2022 FINDINGS: The heart size and mediastinal contours are within normal limits. There is mild calcification of the aortic arch. Both lungs are clear. The visualized skeletal structures are unremarkable. IMPRESSION: No active disease. Electronically Signed   By: TVirgina NorfolkM.D.   On: 05/11/2022 00:11     PROCEDURES:  Critical Care  performed: No     Procedures    IMPRESSION / MDM / ASSESSMENT AND PLAN / ED COURSE  I reviewed the triage vital signs and the nursing notes.    Patient here with complaints of abdominal pain, nausea, vomiting and diarrhea.  Also complains of chest pain which is chronic for him and shortness of breath.     DIFFERENTIAL  DIAGNOSIS (includes but not limited to):   Viral gastroenteritis, colitis, diverticulitis, appendicitis, bowel obstruction, UTI, ACS, pneumonia, COPD exacerbation, CHF exacerbation   Patient's presentation is most consistent with acute presentation with potential threat to life or bodily function.   PLAN: CBC, CMP, lipase and urine obtained from triage without significant abnormality.  No leukocytosis.  AST mildly elevated which is chronic as well as total bilirubin.  Lipase minimally elevated at 56.  We will add on troponin x2, BNP.  Will obtain chest x-ray and CT of the abdomen pelvis.  EKG shows no ischemic change compared to previous.  Will give medications for symptomatic relief.   MEDICATIONS GIVEN IN ED: Medications  ondansetron (ZOFRAN) injection 4 mg (has no administration in time range)  sodium chloride 0.9 % bolus 1,000 mL (0 mLs Intravenous Stopped 05/11/22 0108)  ondansetron (ZOFRAN) injection 4 mg (4 mg Intravenous Given 05/11/22 0016)  ketorolac (TORADOL) 30 MG/ML injection 15 mg (15 mg Intravenous Given 05/11/22 0017)  iohexol (OMNIPAQUE) 300 MG/ML solution 100 mL (100 mLs Intravenous Contrast Given 05/11/22 0031)     ED COURSE: Pending x2 negative.  Chest x-ray reviewed and interpreted by myself and the radiologist and shows no acute abnormality.  EKG shows no new ischemic change compared to previous.  No vomiting or diarrhea witnessed here in the emergency department.  Will p.o. challenge.  CT of the abdomen pelvis reviewed and interpreted by myself and the radiologist and shows hepatic cirrhosis with findings of portal venous hypertension and trace  perihepatic ascites.  He does have body wall anasarca.  We have discontinued his IV fluids and he received less than 500 mL.   At this time, I do not feel there is any life-threatening condition present. I reviewed all nursing notes, vitals, pertinent previous records.  All lab and urine results, EKGs, imaging ordered have been independently reviewed and interpreted by myself.  I reviewed all available radiology reports from any imaging ordered this visit.  Based on my assessment, I feel the patient is safe to be discharged home without further emergent workup and can continue workup as an outpatient as needed. Discussed all findings, treatment plan as well as usual and customary return precautions.  They verbalize understanding and are comfortable with this plan.  Outpatient follow-up has been provided as needed.  All questions have been answered.    CONSULTS:  none   OUTSIDE RECORDS REVIEWED: Reviewed patient's last admission on 04/28/2022 to 04/30/2022.       FINAL CLINICAL IMPRESSION(S) / ED DIAGNOSES   Final diagnoses:  Nausea vomiting and diarrhea  Atypical chest pain     Rx / DC Orders   ED Discharge Orders          Ordered    ondansetron (ZOFRAN-ODT) 4 MG disintegrating tablet  Every 6 hours PRN        05/11/22 0138             Note:  This document was prepared using Dragon voice recognition software and may include unintentional dictation errors.   Meghan Warshawsky, Delice Bison, DO 05/11/22 0151

## 2022-05-10 NOTE — ED Notes (Signed)
Patient transported to X-ray 

## 2022-05-11 ENCOUNTER — Emergency Department: Payer: Medicare Other

## 2022-05-11 DIAGNOSIS — R112 Nausea with vomiting, unspecified: Secondary | ICD-10-CM | POA: Diagnosis not present

## 2022-05-11 LAB — TROPONIN I (HIGH SENSITIVITY)
Troponin I (High Sensitivity): 15 ng/L (ref <18)
Troponin I (High Sensitivity): 16 ng/L (ref <18)

## 2022-05-11 LAB — BRAIN NATRIURETIC PEPTIDE: B Natriuretic Peptide: 376.7 pg/mL — ABNORMAL HIGH (ref 0.0–100.0)

## 2022-05-11 MED ORDER — IOHEXOL 300 MG/ML  SOLN
100.0000 mL | Freq: Once | INTRAMUSCULAR | Status: AC | PRN
  Administered 2022-05-11: 100 mL via INTRAVENOUS

## 2022-05-11 MED ORDER — ONDANSETRON HCL 4 MG/2ML IJ SOLN
4.0000 mg | Freq: Once | INTRAMUSCULAR | Status: AC
  Administered 2022-05-11: 4 mg via INTRAVENOUS
  Filled 2022-05-11: qty 2

## 2022-05-11 MED ORDER — ONDANSETRON 4 MG PO TBDP: 4.0000 mg | ORAL_TABLET | Freq: Four times a day (QID) | ORAL | 0 refills | Status: DC | PRN

## 2022-05-11 NOTE — ED Notes (Signed)
Pt c/o dry heaving after drinking ginger ale.

## 2022-05-11 NOTE — ED Notes (Signed)
Pt verbalized understanding of discharge instructions, prescription, and follow-up care instructions. Pt advised if symptoms worsen to return to ED. E-signature not available due to e-signature not working.

## 2022-05-14 ENCOUNTER — Telehealth (HOSPITAL_COMMUNITY): Payer: Self-pay

## 2022-05-14 NOTE — Telephone Encounter (Signed)
Contacted [name] to advise him of a home for rent that appeared to be a boarding home.  He wanted me to text the number to him and he will check out tomorrow when in [place].  He states doing ok today, has no needs.  He is in [place] [employer].  [place] [phone]

## 2022-05-16 ENCOUNTER — Emergency Department: Payer: Medicare Other

## 2022-05-16 ENCOUNTER — Emergency Department
Admission: EM | Admit: 2022-05-16 | Discharge: 2022-05-16 | Disposition: A | Discharge status: Home / Self Care | Payer: Medicare Other | Attending: Emergency Medicine | Admitting: Emergency Medicine

## 2022-05-16 ENCOUNTER — Encounter: Payer: Self-pay | Admitting: Emergency Medicine

## 2022-05-16 ENCOUNTER — Other Ambulatory Visit: Payer: Self-pay

## 2022-05-16 DIAGNOSIS — I10 Essential (primary) hypertension: Secondary | ICD-10-CM | POA: Insufficient documentation

## 2022-05-16 DIAGNOSIS — M25512 Pain in left shoulder: Secondary | ICD-10-CM | POA: Diagnosis not present

## 2022-05-16 DIAGNOSIS — W19XXXA Unspecified fall, initial encounter: Secondary | ICD-10-CM

## 2022-05-16 DIAGNOSIS — M545 Low back pain, unspecified: Secondary | ICD-10-CM | POA: Insufficient documentation

## 2022-05-16 DIAGNOSIS — W01198A Fall on same level from slipping, tripping and stumbling with subsequent striking against other object, initial encounter: Secondary | ICD-10-CM | POA: Diagnosis not present

## 2022-05-16 DIAGNOSIS — M25511 Pain in right shoulder: Secondary | ICD-10-CM | POA: Insufficient documentation

## 2022-05-16 DIAGNOSIS — E119 Type 2 diabetes mellitus without complications: Secondary | ICD-10-CM | POA: Insufficient documentation

## 2022-05-16 MED ORDER — ACETAMINOPHEN 500 MG PO TABS
1000.0000 mg | ORAL_TABLET | Freq: Once | ORAL | Status: AC
  Administered 2022-05-16: 1000 mg via ORAL
  Filled 2022-05-16: qty 2

## 2022-05-16 NOTE — Congregational Nurse Program (Signed)
  Dept: [phone]   Congregational Nurse Program Note  Date of Encounter: 05/16/2022 New client at [facility] clinic. [place] He also reported he had a "lead" on a boarding room for he and his girlfriend. He was just released from  the [facility] emergency room this morning, reporting a fall and some back pain. He had several bottles of medication with him, all current prescriptions. Unable to report his last Md visit at [facility]. RN offered to assist with an appointment, he declined. RN encouraged client to return to clinic tomorrow for continued monitoring of vital signs, setting up an Md appointment and assistance with medication refills. Client voiced understanding. Past Medical History: Past Medical History:  Diagnosis Date   Asthma    CHF (congestive heart failure) ([place])    COPD (chronic obstructive pulmonary disease) ([place])    COVID-19 03/2020   diagnosed in August 2021   Diabetes mellitus without complication ([place])    Homelessness    Hypertension    Infestation by bed bug    Migraine    Obesity    Sleep apnea     Encounter Details:  CNP Questionnaire - 05/16/22 1503       Questionnaire   Do you give verbal consent to treat you today? Yes    Location Patient [place] or Organization    Patient Status Homeless    Insurance [place];Medicare    Insurance Referral N/A    Medication N/A   client had several bottles of medications with him. reviewed.   Medical Provider Yes   reports he is a client at [facility] Referral N/A    Medical Appointment Made N/A   discussed contacting [facility] for this client, he declined at time of visit   [place] bus system   Housing/Utilities No permanent housing    Interpersonal Safety N/A    Intervention Blood pressure;Support;Educate     ED Visit Averted N/A    Life-Saving Intervention Made N/A

## 2022-05-16 NOTE — Discharge Instructions (Signed)
Use Tylenol for pain and fevers.  Up to 1000 mg per dose, up to 4 times per day.  Do not take more than 4000 mg of Tylenol/acetaminophen within 24 hours..  

## 2022-05-16 NOTE — ED Triage Notes (Signed)
Pt st "I was sitting there leaning against the wall talking to the deputy and my leg gave out and I fell on my butt"; c/o lower back and shoulder pain

## 2022-05-16 NOTE — ED Provider Notes (Signed)
[facility] Provider Note    Event Date/Time   First MD Initiated Contact with Patient 05/16/22 0330     (approximate)   History   Fall   HPI  [name] is a 71 y.o. male who presents to the ED for evaluation of Fall   I reviewed DC summary from medical admission from 9/4.  Obese cirrhotic patient with a history of DM and HTN.  No anticoagulation.  Patient presents to the ED for evaluation of lumbar pain after a fall this evening.  Reports he was standing when his leg gave out and fell backwards and struck his lower back.  Pain to his lower back and bilateral posterior shoulders.  No head trauma or syncope.   Physical Exam   Triage Vital Signs: ED Triage Vitals  Enc Vitals Group     BP 05/16/22 0041 (!) 162/75     Pulse Rate 05/16/22 0041 (!) 58     Resp 05/16/22 0041 16     Temp 05/16/22 0041 98.8 F (37.1 C)     Temp Source 05/16/22 0041 Oral     SpO2 05/16/22 0041 99 %     Weight 05/16/22 0058 265 lb (120.2 kg)     Height 05/16/22 0058 '5\' 6"'$  (1.676 m)     Head Circumference --      Peak Flow --      Pain Score 05/16/22 0058 7     Pain Loc --      Pain Edu? --      Excl. in [place]? --     Most recent vital signs: Vitals:   05/16/22 0041 05/16/22 0337  BP: (!) 162/75 (!) 149/70  Pulse: (!) 58 61  Resp: 16 16  Temp: 98.8 F (37.1 C) 98.4 F (36.9 C)  SpO2: 99% 98%    General: Awake, no distress.  CV:  Good peripheral perfusion.  Resp:  Normal effort.  Abd:  No distention.  MSK:  No deformity noted.  No signs of trauma to the back and no midline tenderness or spinal step-offs.  Poorly localizing paraspinal bilateral lumbar tenderness is present. Neuro:  No focal deficits appreciated. Cranial nerves II through XII intact 5/5 strength and sensation in all 4 extremities Other:     ED Results / Procedures / Treatments   Labs (all labs ordered are listed, but only abnormal results are displayed) Labs Reviewed - No data to  display  EKG   RADIOLOGY CXR interpreted by me without evidence of acute cardiopulmonary pathology. Plain film of the left shoulder interpreted by me without evidence of fracture or dislocation. Plain film of the right shoulder interpreted by me without evidence of fracture or dislocation. Plain film lumbar spine interpreted by me without evidence of fracture or dislocation.  Official radiology report(s): DG Chest Portable 1 View  Result Date: 05/16/2022 CLINICAL DATA:  Status post fall. EXAM: PORTABLE CHEST 1 VIEW COMPARISON:  May 10, 2022 FINDINGS: The heart size and mediastinal contours are within normal limits. There is mild calcification of the aortic arch. Both lungs are clear. The visualized skeletal structures are unremarkable. IMPRESSION: No active disease. Electronically Signed   By: Virgina Norfolk M.D.   On: 05/16/2022 02:25   DG Shoulder Right  Result Date: 05/16/2022 CLINICAL DATA:  Status post fall. EXAM: RIGHT SHOULDER - 2+ VIEW COMPARISON:  None Available. FINDINGS: There is no evidence of an acute fracture or dislocation. Mild degenerative changes are seen involving the right  acromioclavicular joint and right glenohumeral articulation. Soft tissues are unremarkable. IMPRESSION: 1. No acute osseous abnormality. 2. Mild degenerative changes. Electronically Signed   By: Virgina Norfolk M.D.   On: 05/16/2022 02:24   DG Shoulder Left  Result Date: 05/16/2022 CLINICAL DATA:  Status post fall. EXAM: LEFT SHOULDER - 2+ VIEW COMPARISON:  None Available. FINDINGS: There is no evidence of an acute fracture or dislocation. A chronic appearing deformity is seen along the medial aspect of the left humeral head. There is no evidence of arthropathy or other focal bone abnormality. Soft tissues are unremarkable. IMPRESSION: No acute osseous abnormality. Electronically Signed   By: Virgina Norfolk M.D.   On: 05/16/2022 02:23   DG Lumbar Spine Complete  Result Date:  05/16/2022 CLINICAL DATA:  Status post fall. EXAM: LUMBAR SPINE - COMPLETE 4+ VIEW COMPARISON:  None Available. FINDINGS: There is no evidence of an acute lumbar spine fracture. Alignment is normal. Mild multilevel endplate sclerosis and multilevel moderate severity lateral osteophyte formation are noted. Bilateral facet joint hypertrophy is seen at the levels of L4-L5 and L5-S1. Mild intervertebral disc space narrowing is noted at the level of L3-L4. IMPRESSION: 1. No acute osseous abnormality. 2. Multilevel degenerative changes, most prominent the levels of L4-L5 and L5-S1. Electronically Signed   By: Virgina Norfolk M.D.   On: 05/16/2022 02:22    PROCEDURES and INTERVENTIONS:  Procedures  Medications  acetaminophen (TYLENOL) tablet 1,000 mg (1,000 mg Oral Given 05/16/22 0344)     IMPRESSION / MDM / ASSESSMENT AND PLAN / ED COURSE  I reviewed the triage vital signs and the nursing notes.  Differential diagnosis includes, but is not limited to, lumbar fracture, cauda equina, stroke, shoulder dislocation  {Patient presents with symptoms of an acute illness or injury that is potentially life-threatening.  71 year old male presents to the ED after fall without evidence of significant acute pathology and suitable for outpatient management.  Looks systemically well.  No overt signs of trauma or neurologic or vascular deficits.  Imaging is benign, as above.  I see no indications for serum diagnostics at this point.  We will discharge with return precautions.      FINAL CLINICAL IMPRESSION(S) / ED DIAGNOSES   Final diagnoses:  Fall, initial encounter  Lumbar pain     Rx / DC Orders   ED Discharge Orders     None        Note:  This document was prepared using Dragon voice recognition software and may include unintentional dictation errors.   Vladimir Crofts, MD 05/16/22 [phone]

## 2022-05-19 ENCOUNTER — Encounter: Payer: Self-pay | Admitting: Intensive Care

## 2022-05-19 ENCOUNTER — Emergency Department
Admission: EM | Admit: 2022-05-19 | Discharge: 2022-05-19 | Disposition: A | Discharge status: Home / Self Care | Payer: Medicare Other | Attending: Emergency Medicine | Admitting: Emergency Medicine

## 2022-05-19 ENCOUNTER — Emergency Department: Payer: Medicare Other

## 2022-05-19 ENCOUNTER — Other Ambulatory Visit: Payer: Self-pay

## 2022-05-19 DIAGNOSIS — R197 Diarrhea, unspecified: Secondary | ICD-10-CM | POA: Diagnosis not present

## 2022-05-19 DIAGNOSIS — R109 Unspecified abdominal pain: Secondary | ICD-10-CM | POA: Insufficient documentation

## 2022-05-19 DIAGNOSIS — Z5900 Homelessness unspecified: Secondary | ICD-10-CM | POA: Insufficient documentation

## 2022-05-19 DIAGNOSIS — R112 Nausea with vomiting, unspecified: Secondary | ICD-10-CM | POA: Diagnosis present

## 2022-05-19 LAB — URINALYSIS, ROUTINE W REFLEX MICROSCOPIC
Bilirubin Urine: NEGATIVE
Glucose, UA: NEGATIVE mg/dL
Hgb urine dipstick: NEGATIVE
Ketones, ur: NEGATIVE mg/dL
Leukocytes,Ua: NEGATIVE
Nitrite: NEGATIVE
Protein, ur: 30 mg/dL — AB
Specific Gravity, Urine: 1.015 (ref 1.005–1.030)
pH: 6 (ref 5.0–8.0)

## 2022-05-19 LAB — COMPREHENSIVE METABOLIC PANEL
ALT: 29 U/L (ref 0–44)
AST: 49 U/L — ABNORMAL HIGH (ref 15–41)
Albumin: 2.4 g/dL — ABNORMAL LOW (ref 3.5–5.0)
Alkaline Phosphatase: 80 U/L (ref 38–126)
Anion gap: 6 (ref 5–15)
BUN: 10 mg/dL (ref 8–23)
CO2: 24 mmol/L (ref 22–32)
Calcium: 9.1 mg/dL (ref 8.9–10.3)
Chloride: 109 mmol/L (ref 98–111)
Creatinine, Ser: 0.9 mg/dL (ref 0.61–1.24)
GFR, Estimated: >60 mL/min (ref >60)
Glucose, Bld: 155 mg/dL — ABNORMAL HIGH (ref 70–99)
Potassium: 3.2 mmol/L — ABNORMAL LOW (ref 3.5–5.1)
Sodium: 139 mmol/L (ref 135–145)
Total Bilirubin: 1.7 mg/dL — ABNORMAL HIGH (ref 0.3–1.2)
Total Protein: 6.8 g/dL (ref 6.5–8.1)

## 2022-05-19 LAB — CBC
HCT: 33.5 % — ABNORMAL LOW (ref 39.0–52.0)
Hemoglobin: 10.6 g/dL — ABNORMAL LOW (ref 13.0–17.0)
MCH: 29.4 pg (ref 26.0–34.0)
MCHC: 31.6 g/dL (ref 30.0–36.0)
MCV: 93.1 fL (ref 80.0–100.0)
Platelets: 105 10*3/uL — ABNORMAL LOW (ref 150–400)
RBC: 3.6 MIL/uL — ABNORMAL LOW (ref 4.22–5.81)
RDW: 17.8 % — ABNORMAL HIGH (ref 11.5–15.5)
WBC: 4.4 10*3/uL (ref 4.0–10.5)
nRBC: 0 % (ref 0.0–0.2)

## 2022-05-19 LAB — LIPASE, BLOOD: Lipase: 40 U/L (ref 11–51)

## 2022-05-19 MED ORDER — ONDANSETRON 4 MG PO TBDP: 4.0000 mg | ORAL_TABLET | Freq: Three times a day (TID) | ORAL | 0 refills | Status: DC | PRN

## 2022-05-19 MED ORDER — LACTATED RINGERS IV BOLUS
1000.0000 mL | Freq: Once | INTRAVENOUS | Status: AC
  Administered 2022-05-19: 1000 mL via INTRAVENOUS

## 2022-05-19 MED ORDER — MORPHINE SULFATE (PF) 2 MG/ML IV SOLN
2.0000 mg | Freq: Once | INTRAVENOUS | Status: AC
  Administered 2022-05-19: 2 mg via INTRAVENOUS
  Filled 2022-05-19: qty 1

## 2022-05-19 MED ORDER — IOHEXOL 300 MG/ML  SOLN
100.0000 mL | Freq: Once | INTRAMUSCULAR | Status: AC | PRN
  Administered 2022-05-19: 100 mL via INTRAVENOUS

## 2022-05-19 MED ORDER — SODIUM CHLORIDE 0.9 % IV BOLUS
1000.0000 mL | Freq: Once | INTRAVENOUS | Status: AC
  Administered 2022-05-19: 1000 mL via INTRAVENOUS

## 2022-05-19 MED ORDER — ONDANSETRON HCL 4 MG/2ML IJ SOLN
4.0000 mg | Freq: Once | INTRAMUSCULAR | Status: AC
  Administered 2022-05-19: 4 mg via INTRAVENOUS
  Filled 2022-05-19: qty 2

## 2022-05-19 NOTE — ED Notes (Signed)
Pt agreeable to wait in lobby until ride gets here

## 2022-05-19 NOTE — Discharge Instructions (Addendum)
Please use the Zofran melt on your tongue wafers 1 3 times a day as needed for nausea or vomiting.  Use Pepto-Bismol if needed for the diarrhea.  Drink clear liquids and small amounts frequently like a teaspoon at a time tonight tomorrow try that brat diet bananas, rice, applesauce and toast in the morning if she can keep that down you can eat what ever you want after that.  Please do not hesitate to return for further nausea vomiting and diarrhea that will not stop or feeling lightheaded or running a fever having bad belly pain or just feeling sicker.

## 2022-05-19 NOTE — ED Triage Notes (Signed)
Patient c/o diarrhea and emesis since this AM.

## 2022-05-19 NOTE — ED Notes (Signed)
Pt transported to CT via stretcher at this time.  

## 2022-05-20 NOTE — ED Provider Notes (Signed)
[facility] Provider Note    Event Date/Time   First MD Initiated Contact with Patient 05/19/22 1826     (approximate)   History   Emesis and Diarrhea   HPI  ELIEL DUDDING is a 71 y.o. male reports nausea vomiting and diarrhea since this morning.  He said he went over to the [place] station reported that minister that gets the food for the homeless that makes him sick gets his food and got a corn dog.  He said he ate a corn dog and it tasted awful and he started having nausea and vomiting and diarrhea within several hours.  After his arrival here in the nausea vomiting diarrhea.  Did go away.  He has not had any since he has been here.      Physical Exam   Triage Vital Signs: ED Triage Vitals  Enc Vitals Group     BP 05/19/22 1733 (!) 185/78     Pulse Rate 05/19/22 1733 65     Resp 05/19/22 1733 18     Temp 05/19/22 1733 98.2 F (36.8 C)     Temp Source 05/19/22 1733 Oral     SpO2 05/19/22 1733 100 %     Weight 05/19/22 1734 254 lb 1.6 oz (115.3 kg)     Height 05/19/22 1734 '5\' 6"'$  (1.676 m)     Head Circumference --      Peak Flow --      Pain Score 05/19/22 1734 7     Pain Loc --      Pain Edu? --      Excl. in [place]? --     Most recent vital signs: Vitals:   05/19/22 2300 05/19/22 2336  BP: (!) 150/73   Pulse: (!) 56   Resp: (!) 21   Temp:  98.8 F (37.1 C)  SpO2: 100%     General: Awake, no distress.  CV:  Good peripheral perfusion.  Heart regular rate and rhythm no audible murmurs Resp:  Normal effort.  Lungs are clear Abd:  Somewhat distended soft patient complains of some pain with palpation    ED Results / Procedures / Treatments   Labs (all labs ordered are listed, but only abnormal results are displayed) Labs Reviewed  COMPREHENSIVE METABOLIC PANEL - Abnormal; Notable for the following components:      Result Value   Potassium 3.2 (*)    Glucose, Bld 155 (*)    Albumin 2.4 (*)    AST 49 (*)    Total Bilirubin 1.7 (*)     All other components within normal limits  CBC - Abnormal; Notable for the following components:   RBC 3.60 (*)    Hemoglobin 10.6 (*)    HCT 33.5 (*)    RDW 17.8 (*)    Platelets 105 (*)    All other components within normal limits  URINALYSIS, ROUTINE W REFLEX MICROSCOPIC - Abnormal; Notable for the following components:   Protein, ur 30 (*)    Bacteria, UA RARE (*)    All other components within normal limits  GASTROINTESTINAL PANEL BY PCR, STOOL (REPLACES STOOL CULTURE)  C DIFFICILE QUICK SCREEN W PCR REFLEX    LIPASE, BLOOD     EKG     RADIOLOGY  CT read by radiology reviewed and interpreted by me shows no new changes PROCEDURES:  Critical Care performed:   Procedures   MEDICATIONS ORDERED IN ED: Medications  sodium chloride 0.9 % bolus 1,000  mL (0 mLs Intravenous Stopped 05/19/22 2016)  iohexol (OMNIPAQUE) 300 MG/ML solution 100 mL (100 mLs Intravenous Contrast Given 05/19/22 1847)  morphine (PF) 2 MG/ML injection 2 mg (2 mg Intravenous Given 05/19/22 1927)  ondansetron (ZOFRAN) injection 4 mg (4 mg Intravenous Given 05/19/22 1928)  lactated ringers bolus 1,000 mL (0 mLs Intravenous Stopped 05/19/22 2130)     IMPRESSION / MDM / ASSESSMENT AND PLAN / ED COURSE  I reviewed the triage vital signs and the nursing notes. Patient doing well during his stay here with no further nausea vomiting or diarrhea we will discharge him with some Zofran just in case and recommendation for some Pepto-Bismol if he needs it.  Differential diagnosis includes, but is not limited to, malingering or viral infection or bacterial gastroenteritis or what is more likely toxic gastroenteritis from staff toxin.  Patient's presentation is most consistent with acute complicated illness / injury requiring diagnostic workup.  The patient is on the cardiac monitor to evaluate for evidence of arrhythmia and/or significant heart rate changes.  None were seen here.   FINAL CLINICAL IMPRESSION(S)  / ED DIAGNOSES   Final diagnoses:  Nausea and vomiting, unspecified vomiting type  Diarrhea of presumed infectious origin     Rx / DC Orders   ED Discharge Orders          Ordered    ondansetron (ZOFRAN-ODT) 4 MG disintegrating tablet  Every 8 hours PRN        05/19/22 2233             Note:  This document was prepared using Dragon voice recognition software and may include unintentional dictation errors.   Nena Polio, MD 05/20/22 [phone]

## 2022-05-21 ENCOUNTER — Other Ambulatory Visit: Payer: Self-pay

## 2022-05-21 DIAGNOSIS — R300 Dysuria: Secondary | ICD-10-CM | POA: Insufficient documentation

## 2022-05-21 DIAGNOSIS — J449 Chronic obstructive pulmonary disease, unspecified: Secondary | ICD-10-CM | POA: Insufficient documentation

## 2022-05-21 DIAGNOSIS — I509 Heart failure, unspecified: Secondary | ICD-10-CM | POA: Insufficient documentation

## 2022-05-21 DIAGNOSIS — R1013 Epigastric pain: Secondary | ICD-10-CM | POA: Insufficient documentation

## 2022-05-21 DIAGNOSIS — E119 Type 2 diabetes mellitus without complications: Secondary | ICD-10-CM | POA: Diagnosis not present

## 2022-05-21 LAB — CBC WITH DIFFERENTIAL/PLATELET
Abs Immature Granulocytes: 0.01 10*3/uL (ref 0.00–0.07)
Basophils Absolute: 0.1 10*3/uL (ref 0.0–0.1)
Basophils Relative: 2 %
Eosinophils Absolute: 0.1 10*3/uL (ref 0.0–0.5)
Eosinophils Relative: 4 %
HCT: 31.5 % — ABNORMAL LOW (ref 39.0–52.0)
Hemoglobin: 10.1 g/dL — ABNORMAL LOW (ref 13.0–17.0)
Immature Granulocytes: 0 %
Lymphocytes Relative: 29 %
Lymphs Abs: 0.7 10*3/uL (ref 0.7–4.0)
MCH: 29 pg (ref 26.0–34.0)
MCHC: 32.1 g/dL (ref 30.0–36.0)
MCV: 90.5 fL (ref 80.0–100.0)
Monocytes Absolute: 0.4 10*3/uL (ref 0.1–1.0)
Monocytes Relative: 17 %
Neutro Abs: 1.2 10*3/uL — ABNORMAL LOW (ref 1.7–7.7)
Neutrophils Relative %: 48 %
Platelets: 82 10*3/uL — ABNORMAL LOW (ref 150–400)
RBC: 3.48 MIL/uL — ABNORMAL LOW (ref 4.22–5.81)
RDW: 17.9 % — ABNORMAL HIGH (ref 11.5–15.5)
WBC: 2.5 10*3/uL — ABNORMAL LOW (ref 4.0–10.5)
nRBC: 0 % (ref 0.0–0.2)

## 2022-05-21 LAB — COMPREHENSIVE METABOLIC PANEL
ALT: 29 U/L (ref 0–44)
AST: 47 U/L — ABNORMAL HIGH (ref 15–41)
Albumin: 2.5 g/dL — ABNORMAL LOW (ref 3.5–5.0)
Alkaline Phosphatase: 73 U/L (ref 38–126)
Anion gap: 7 (ref 5–15)
BUN: 8 mg/dL (ref 8–23)
CO2: 25 mmol/L (ref 22–32)
Calcium: 8.9 mg/dL (ref 8.9–10.3)
Chloride: 107 mmol/L (ref 98–111)
Creatinine, Ser: 0.93 mg/dL (ref 0.61–1.24)
GFR, Estimated: >60 mL/min (ref >60)
Glucose, Bld: 91 mg/dL (ref 70–99)
Potassium: 3.4 mmol/L — ABNORMAL LOW (ref 3.5–5.1)
Sodium: 139 mmol/L (ref 135–145)
Total Bilirubin: 2 mg/dL — ABNORMAL HIGH (ref 0.3–1.2)
Total Protein: 6.7 g/dL (ref 6.5–8.1)

## 2022-05-21 LAB — LIPASE, BLOOD: Lipase: 36 U/L (ref 11–51)

## 2022-05-21 NOTE — ED Provider Triage Note (Signed)
Emergency Medicine Provider Triage Evaluation Note  [name] , a 71 y.o. male  was evaluated in triage.  Pt complains of nausea and diarrhea.  Patient has had symptoms x2 days.  Was seen on day 1 after he believes he ate some bad food.  Patient has had Zofran which states has improved the vomiting but is still having diarrhea.  Patient has burning in his epigastric region.  No blood in the diarrhea..  Review of Systems  Positive: Epigastric pain, nausea, diarrhea Negative: Blood in stools, cp, sob  Physical Exam  BP (!) 141/69 (BP Location: Left Arm)   Pulse (!) 59   Temp 98.8 F (37.1 C) (Oral)   Resp 18   Ht '5\' 6"'$  (1.676 m)   Wt 115.7 kg   SpO2 100%   BMI 41.16 kg/m  Gen:   Awake, no distress   Resp:  Normal effort  MSK:   Moves extremities without difficulty  Other:    Medical Decision Making  Medically screening exam initiated at 8:26 PM.  Appropriate orders placed.  [name] was informed that the remainder of the evaluation will be completed by another provider, this initial triage assessment does not replace that evaluation, and the importance of remaining in the ED until their evaluation is complete.  Basic labs. Reviewed labs and CT from two days ago. Basic labs today   Brynda Peon 05/21/22 2026

## 2022-05-21 NOTE — ED Triage Notes (Signed)
Pt arrives from home via [facility].  C/O N/V/D x 2 days and upper mid epigastric pain x 1 day worse with drinking and eating. Pain level is 1-2 and ups to 10 with swallowing.  Denies throat pain. NAD at this time.

## 2022-05-22 ENCOUNTER — Emergency Department
Admission: EM | Admit: 2022-05-22 | Discharge: 2022-05-22 | Disposition: A | Discharge status: Home / Self Care | Payer: Medicare Other | Attending: Emergency Medicine | Admitting: Emergency Medicine

## 2022-05-22 DIAGNOSIS — R1013 Epigastric pain: Secondary | ICD-10-CM | POA: Diagnosis not present

## 2022-05-22 LAB — URINALYSIS, ROUTINE W REFLEX MICROSCOPIC
Bilirubin Urine: NEGATIVE
Glucose, UA: NEGATIVE mg/dL
Hgb urine dipstick: NEGATIVE
Ketones, ur: NEGATIVE mg/dL
Leukocytes,Ua: NEGATIVE
Nitrite: NEGATIVE
Protein, ur: 100 mg/dL — AB
Specific Gravity, Urine: 1.031 — ABNORMAL HIGH (ref 1.005–1.030)
pH: 5 (ref 5.0–8.0)

## 2022-05-22 MED ORDER — FAMOTIDINE 20 MG PO TABS
20.0000 mg | ORAL_TABLET | Freq: Once | ORAL | Status: AC
  Administered 2022-05-22: 20 mg via ORAL
  Filled 2022-05-22: qty 1

## 2022-05-22 MED ORDER — ALUM & MAG HYDROXIDE-SIMETH 200-200-20 MG/5ML PO SUSP
30.0000 mL | Freq: Once | ORAL | Status: AC
  Administered 2022-05-22: 30 mL via ORAL
  Filled 2022-05-22: qty 30

## 2022-05-22 NOTE — ED Provider Notes (Addendum)
[facility] Provider Note    Event Date/Time   First MD Initiated Contact with Patient 05/22/22 [phone]     (approximate)   History   Abdominal Pain (Epigastric w/N/V/D)   HPI  [name] is a 71 y.o. male   Past medical history of CHF, COPD, diabetes, homelessness, asthma who presents with nausea, loose stools, epigastric pain which is worse with eating for the past 3 days.  He was seen in the emergency department 2 days ago with a CT scan of the abdomen pelvis which was negative and was discharged.  His symptoms are unchanged since then, and unchanged in location and quality and severity.  He states that he had been on Prilosec for an extended period of time and recently taken off, seems that these episodes of epigastric pain have worsened since he has been off of Prilosec.  He has taken antacids with some transient relief of pain.  No blood in the stools.  He does states some mild dysuria.  He denies testicular pain.  No fever or chills.  No blood in the emesis.  Denies chest pain or shortness of breath.  Feels that the epigastric pain will radiate up through the chest and produce a sour taste in his mouth at times.  History was obtained via the patient and a review of external medical notes      Physical Exam   Triage Vital Signs: ED Triage Vitals  Enc Vitals Group     BP 05/21/22 2020 (!) 141/69     Pulse Rate 05/21/22 2020 (!) 59     Resp 05/21/22 2020 18     Temp 05/21/22 2020 98.8 F (37.1 C)     Temp Source 05/21/22 2020 Oral     SpO2 05/21/22 2020 100 %     Weight 05/21/22 2019 255 lb (115.7 kg)     Height 05/21/22 2019 '5\' 6"'$  (1.676 m)     Head Circumference --      Peak Flow --      Pain Score 05/21/22 2029 2     Pain Loc --      Pain Edu? --      Excl. in [place]? --     Most recent vital signs: Vitals:   05/22/22 0656 05/22/22 0659  BP: (!) 157/69   Pulse: 66   Resp: 18   Temp:  98.1 F (36.7 C)  SpO2: 97%      General: Awake, no distress.  CV:  Good peripheral perfusion.  Resp:  Normal effort.  No focality or wheezing Abd:  No distention.  No rigidity or guarding.  Mild epigastric tenderness to palpation.  No right upper quadrant tenderness or lower abdominal tenderness to palpation. Other:  Comfortable and nontoxic-appearing.   ED Results / Procedures / Treatments   Labs (all labs ordered are listed, but only abnormal results are displayed) Labs Reviewed  COMPREHENSIVE METABOLIC PANEL - Abnormal; Notable for the following components:      Result Value   Potassium 3.4 (*)    Albumin 2.5 (*)    AST 47 (*)    Total Bilirubin 2.0 (*)    All other components within normal limits  CBC WITH DIFFERENTIAL/PLATELET - Abnormal; Notable for the following components:   WBC 2.5 (*)    RBC 3.48 (*)    Hemoglobin 10.1 (*)    HCT 31.5 (*)    RDW 17.9 (*)    Platelets 82 (*)  Neutro Abs 1.2 (*)    All other components within normal limits  LIPASE, BLOOD  URINALYSIS, ROUTINE W REFLEX MICROSCOPIC     I reviewed labs and they are notable for total bilirubin 2.0 which is consistent with prior  EKG  ED ECG REPORT I, Lucillie Garfinkel, the attending physician, personally viewed and interpreted this ECG.   Date: 05/22/2022  EKG Time: 2024   Rate: 56  Rhythm: junctional rhythm, unchanged from previous tracings, occasional PVC noted, unifocal  Axis: normal  Intervals: no ischemic changes  PROCEDURES:  Critical Care performed: No  Procedures   MEDICATIONS ORDERED IN ED: Medications  famotidine (PEPCID) tablet 20 mg (20 mg Oral Given 05/22/22 0657)  alum & mag hydroxide-simeth (MAALOX/MYLANTA) 200-200-20 MG/5ML suspension 30 mL (30 mLs Oral Given 05/22/22 0657)    IMPRESSION / MDM / ASSESSMENT AND PLAN / ED COURSE  I reviewed the triage vital signs and the nursing notes.                              Differential diagnosis includes, but is not limited to, GERD, gastritis, h pylori,  ulcer, consider retained stone, intraabdominal infection, obstruction, ACS.   MDM: Patient with ongoing abdominal symptoms consistent with gastritis/GERD that has been responsive to antacids and is otherwise well-appearing with no changes in his symptoms and negative CT scan in the last 3 days.  Given antacids in the emergency department and advised to follow-up with primary doctor regarding PPIs, H. pylori testing, referral to GI for endoscopy as needed.  I did consider more emergent pathologies like intra-abdominal infection, obstruction, and offered the patient a repeat CT scan to assess for these potential ailments but the patient declines, opting for antacid treatments and close self monitoring for worsening in which case he will return to the emergency department to obtain further evaluation.  Urinalysis to check for urinary tract infection given mild dysuria   Considered but less likely cardiac etiology given no chest pain and symptoms and exam much more consistent with gastritis/GERD.  His EKG was nonischemic appearing, and I did offer this patient further testing with troponins given age and risk factors, but he opted not to prolong his emergency department stay and defer troponins in order to make his primary doctor visit later this morning.   Patient's presentation is most consistent with acute illness / injury with system symptoms.       FINAL CLINICAL IMPRESSION(S) / ED DIAGNOSES   Final diagnoses:  Abdominal pain, epigastric     Rx / DC Orders   ED Discharge Orders     None        Note:  This document was prepared using Dragon voice recognition software and may include unintentional dictation errors.    Lucillie Garfinkel, MD 05/22/22 [place], [place], MD 05/22/22 [phone]

## 2022-05-22 NOTE — Discharge Instructions (Signed)
Take pepcid as prescribed.   See your doctor today for your appointment as scheduled.  Talk to your doctor about whether restarting a proton pump inhibitor like Prilosec would be appropriate at this time.  Also talk to them about whether H. pylori testing and a referral to gastroenterology for an endoscopy would be appropriate as well.  Thank you for choosing [place] for your health care today!  Please see your primary doctor this week for a follow up appointment.   If you do not have a primary doctor call the following clinics to establish care:  If you have insurance:  [facility] [phone] [place] [address]   [name]  [phone] [address] [place] [address]   If you do not have insurance:  [facility]  [phone] [address] [place] [address]  Sometimes, in the early stages of certain disease courses it is difficult to detect in the emergency department evaluation -- so, it is important that you continue to monitor your symptoms and call your doctor right away or return to the emergency department if you develop any new or worsening symptoms.  It was my pleasure to care for you today.   Hoover Brunette Jacelyn Grip, MD

## 2022-05-24 ENCOUNTER — Emergency Department
Admission: EM | Admit: 2022-05-24 | Discharge: 2022-05-24 | Disposition: A | Discharge status: Home / Self Care | Payer: Medicare Other | Attending: Emergency Medicine | Admitting: Emergency Medicine

## 2022-05-24 ENCOUNTER — Emergency Department: Payer: Medicare Other

## 2022-05-24 DIAGNOSIS — J45909 Unspecified asthma, uncomplicated: Secondary | ICD-10-CM | POA: Diagnosis not present

## 2022-05-24 DIAGNOSIS — F4323 Adjustment disorder with mixed anxiety and depressed mood: Secondary | ICD-10-CM

## 2022-05-24 DIAGNOSIS — I5032 Chronic diastolic (congestive) heart failure: Secondary | ICD-10-CM | POA: Insufficient documentation

## 2022-05-24 DIAGNOSIS — J449 Chronic obstructive pulmonary disease, unspecified: Secondary | ICD-10-CM | POA: Insufficient documentation

## 2022-05-24 DIAGNOSIS — R1013 Epigastric pain: Secondary | ICD-10-CM | POA: Diagnosis not present

## 2022-05-24 DIAGNOSIS — E119 Type 2 diabetes mellitus without complications: Secondary | ICD-10-CM | POA: Insufficient documentation

## 2022-05-24 DIAGNOSIS — Z20822 Contact with and (suspected) exposure to covid-19: Secondary | ICD-10-CM | POA: Diagnosis not present

## 2022-05-24 DIAGNOSIS — R109 Unspecified abdominal pain: Secondary | ICD-10-CM | POA: Diagnosis present

## 2022-05-24 DIAGNOSIS — Z5902 Unsheltered homelessness: Secondary | ICD-10-CM

## 2022-05-24 DIAGNOSIS — Z8616 Personal history of COVID-19: Secondary | ICD-10-CM | POA: Diagnosis not present

## 2022-05-24 DIAGNOSIS — I110 Hypertensive heart disease with heart failure: Secondary | ICD-10-CM | POA: Diagnosis not present

## 2022-05-24 LAB — COMPREHENSIVE METABOLIC PANEL
ALT: 30 U/L (ref 0–44)
AST: 51 U/L — ABNORMAL HIGH (ref 15–41)
Albumin: 2.6 g/dL — ABNORMAL LOW (ref 3.5–5.0)
Alkaline Phosphatase: 77 U/L (ref 38–126)
Anion gap: 9 (ref 5–15)
BUN: 11 mg/dL (ref 8–23)
CO2: 25 mmol/L (ref 22–32)
Calcium: 9.2 mg/dL (ref 8.9–10.3)
Chloride: 106 mmol/L (ref 98–111)
Creatinine, Ser: 1.02 mg/dL (ref 0.61–1.24)
GFR, Estimated: >60 mL/min (ref >60)
Glucose, Bld: 159 mg/dL — ABNORMAL HIGH (ref 70–99)
Potassium: 3.6 mmol/L (ref 3.5–5.1)
Sodium: 140 mmol/L (ref 135–145)
Total Bilirubin: 2 mg/dL — ABNORMAL HIGH (ref 0.3–1.2)
Total Protein: 7 g/dL (ref 6.5–8.1)

## 2022-05-24 LAB — URINALYSIS, ROUTINE W REFLEX MICROSCOPIC
Bacteria, UA: NONE SEEN
Bilirubin Urine: NEGATIVE
Glucose, UA: NEGATIVE mg/dL
Hgb urine dipstick: NEGATIVE
Ketones, ur: 5 mg/dL — AB
Leukocytes,Ua: NEGATIVE
Nitrite: NEGATIVE
Protein, ur: 100 mg/dL — AB
Specific Gravity, Urine: 1.021 (ref 1.005–1.030)
pH: 6 (ref 5.0–8.0)

## 2022-05-24 LAB — ETHANOL: Alcohol, Ethyl (B): <10 mg/dL (ref <10)

## 2022-05-24 LAB — CBC
HCT: 33.6 % — ABNORMAL LOW (ref 39.0–52.0)
Hemoglobin: 10.7 g/dL — ABNORMAL LOW (ref 13.0–17.0)
MCH: 30 pg (ref 26.0–34.0)
MCHC: 31.8 g/dL (ref 30.0–36.0)
MCV: 94.1 fL (ref 80.0–100.0)
Platelets: 92 10*3/uL — ABNORMAL LOW (ref 150–400)
RBC: 3.57 MIL/uL — ABNORMAL LOW (ref 4.22–5.81)
RDW: 18.2 % — ABNORMAL HIGH (ref 11.5–15.5)
WBC: 2.9 10*3/uL — ABNORMAL LOW (ref 4.0–10.5)
nRBC: 0 % (ref 0.0–0.2)

## 2022-05-24 LAB — URINE DRUG SCREEN, QUALITATIVE (ARMC ONLY)
Amphetamines, Ur Screen: NOT DETECTED
Barbiturates, Ur Screen: NOT DETECTED
Benzodiazepine, Ur Scrn: NOT DETECTED
Cannabinoid 50 Ng, Ur ~~LOC~~: NOT DETECTED
Cocaine Metabolite,Ur ~~LOC~~: NOT DETECTED
MDMA (Ecstasy)Ur Screen: NOT DETECTED
Methadone Scn, Ur: NOT DETECTED
Opiate, Ur Screen: NOT DETECTED
Phencyclidine (PCP) Ur S: NOT DETECTED
Tricyclic, Ur Screen: POSITIVE — AB

## 2022-05-24 LAB — ACETAMINOPHEN LEVEL: Acetaminophen (Tylenol), Serum: <10 ug/mL — ABNORMAL LOW (ref 10–30)

## 2022-05-24 LAB — SARS CORONAVIRUS 2 BY RT PCR: SARS Coronavirus 2 by RT PCR: NEGATIVE

## 2022-05-24 LAB — SALICYLATE LEVEL: Salicylate Lvl: <7 mg/dL — ABNORMAL LOW (ref 7.0–30.0)

## 2022-05-24 MED ORDER — OXYCODONE-ACETAMINOPHEN 5-325 MG PO TABS
1.0000 | ORAL_TABLET | Freq: Once | ORAL | Status: AC
  Administered 2022-05-24: 1 via ORAL
  Filled 2022-05-24: qty 1

## 2022-05-24 NOTE — ED Notes (Signed)
Pt given a [place] sandwich tray and a cup of ginger ale at this time.

## 2022-05-24 NOTE — ED Provider Triage Note (Signed)
Emergency Medicine Provider Triage Evaluation Note  [name] , a 71 y.o. male  was evaluated in triage.  Patient has a history of hypertension, diabetes, COPD, homelessness, asthma and CHF.  He is presenting to the emergency department for multiple medical complaints.  He states that he has sharp, diffuse abdominal pain.  Patient is also expressing suicidal ideation in triage stating that he wants somebody to give him a shot and and things permanently for him.  He denies fever, chills, vomiting or diarrhea.  Review of Systems  Positive: Patient has abdominal pain and suicidal ideation.  Negative: No chest pain or chest tightness.   Physical Exam  BP 124/76 (BP Location: Right Arm)   Pulse 85   Temp 98.5 F (36.9 C) (Oral)   Resp 17   Ht '5\' 9"'$  (1.753 m)   Wt 110 kg   SpO2 98%   BMI 35.81 kg/m  Gen:   Awake, no distress   Resp:  Normal effort  MSK:   Moves extremities without difficulty  Other:    Medical Decision Making  Medically screening exam initiated at 3:45 PM.  Appropriate orders placed.  [name] was informed that the remainder of the evaluation will be completed by another provider, this initial triage assessment does not replace that evaluation, and the importance of remaining in the ED until their evaluation is complete.     Vallarie Mare [place], [place] 05/24/22 1546

## 2022-05-24 NOTE — Discharge Instructions (Signed)
Blood work x-ray and EKG were all reassuring today.  Please follow-up with your primary doctor and at the [facility].  You were seen by psychiatry and cleared.

## 2022-05-24 NOTE — Consult Note (Signed)
[facility] Consult   Reason for Consult:  feeling like "giving up" due to homelessness Referring Physician:  Starleen Blue Patient Identification: [name] MRN:  [id number] Principal Diagnosis: Unsheltered homelessness Diagnosis:  Principal Problem:   Unsheltered homelessness   Total Time spent with patient: 30 minutes  Subjective: "I would like a place to rest for a few days"   [name] is a 71 y.o. male patient admitted with abdominal pain.Marland Kitchen  HPI: Patient has presented to the ED many times over the last month with various complaints.  Today he presented with his stomach pain and states that if he cannot get the pain to stop that he feels like "cutting his wrists."  On evaluation, patient is calm, interactive with the interview.  He does not appear to be in any pain at this time.  He states that he is suicidal because of his pain "all over my body" and homelessness and sometimes he just "feels like giving up." He denies previous suicide attempts.  He denies auditory or visual hallucinations.  Does not appear to be responding to internal stimuli.  Patient does not have a plan for suicide.  He states that he has PTSD and agoraphobia.  He states that he has "been on every kind of medicine there is" for his mental health, but he cannot recall the names.  Patient denies any thoughts of harming anyone else.  Discussed with patient that he does not meet criteria for psychiatric admission.  Patient was getting outpatient resources for homelessness and mental health services.  Patient does report that he is on probation and he cannot leave the county.  He states that he hopes he can stay here a little while because he needs some rest.   Past Psychiatric History: Self-reported PTSD and agoraphobia  Risk to Self:   Risk to Others:   Prior Inpatient Therapy:   Prior Outpatient Therapy:    Past Medical History:  Past Medical History:  Diagnosis Date   Asthma    CHF  (congestive heart failure) (HCC)    COPD (chronic obstructive pulmonary disease) ([place])    COVID-19 03/2020   diagnosed in August 2021   Diabetes mellitus without complication ([place])    Homelessness    Hypertension    Infestation by bed bug    Migraine    Obesity    Sleep apnea     Past Surgical History:  Procedure Laterality Date   CARDIAC CATHETERIZATION     CHOLECYSTECTOMY     EYE SURGERY     INNER EAR SURGERY     NOSE SURGERY     Family History:  Family History  Problem Relation Age of Onset   Hypertension Mother    Heart disease Mother    Heart failure Maternal Grandmother    Family Psychiatric  History: None reported Social History:  Social History   Substance and Sexual Activity  Alcohol Use Never     Social History   Substance and Sexual Activity  Drug Use Never    Social History   Socioeconomic History   Marital status: Single    Spouse name: Not on file   Number of children: 2   Years of education: 12   Highest education level: 12th grade  Occupational History   Occupation: retired  Tobacco Use   Smoking status: Former   Smokeless tobacco: Never  Scientific laboratory technician Use: Not on file  Substance and Sexual Activity   Alcohol use: Never  Drug use: Never   Sexual activity: Never    Birth control/protection: None  Other Topics Concern   Not on file  Social History Narrative   Not on file   Social Determinants of Health   Financial Resource Strain: High Risk (04/27/2022)   Overall Financial Resource Strain (CARDIA)    Difficulty of Paying Living Expenses: Very hard  Food Insecurity: Food Insecurity Present (04/27/2022)   Hunger Vital Sign    Worried About Running Out of Food in the Last Year: Often true    Ran Out of Food in the Last Year: Often true  Transportation Needs: Unmet Transportation Needs (04/27/2022)   PRAPARE - Transportation    Lack of Transportation (Medical): Yes    Lack of Transportation (Non-Medical): Yes  Physical Activity:  Sufficiently Active (04/16/2018)   Exercise Vital Sign    Days of Exercise per Week: 7 days    Minutes of Exercise per Session: 30 min  Stress: Stress Concern Present (04/16/2018)   [place]    Feeling of Stress : Very much  Social Connections: Moderately Isolated (04/30/2018)   Social Connection and Isolation Panel [NHANES]    Frequency of Communication with Friends and Family: Twice a week    Frequency of Social Gatherings with Friends and Family: Never    Attends Religious Services: More than 4 times per year    Active Member of [employer] or Organizations: No    Attends Archivist Meetings: Never    Marital Status: Widowed   Additional Social History:    Allergies:   Allergies  Allergen Reactions   Codeine Itching and Swelling    Rash, Swelling of face/tongue/legs, itching   Novocain [Procaine]    Sulfa Antibiotics Other (See Comments)    Labs:  Results for orders placed or performed during the hospital encounter of 05/24/22 (from the past 48 hour(s))  Comprehensive metabolic panel     Status: Abnormal   Collection Time: 05/24/22  3:50 PM  Result Value Ref Range   Sodium 140 135 - 145 mmol/L   Potassium 3.6 3.5 - 5.1 mmol/L   Chloride 106 98 - 111 mmol/L   CO2 25 22 - 32 mmol/L   Glucose, Bld 159 (H) 70 - 99 mg/dL    Comment: Glucose reference range applies only to samples taken after fasting for at least 8 hours.   BUN 11 8 - 23 mg/dL   Creatinine, Ser 1.02 0.61 - 1.24 mg/dL   Calcium 9.2 8.9 - 10.3 mg/dL   Total Protein 7.0 6.5 - 8.1 g/dL   Albumin 2.6 (L) 3.5 - 5.0 g/dL   AST 51 (H) 15 - 41 U/L   ALT 30 0 - 44 U/L   Alkaline Phosphatase 77 38 - 126 U/L   Total Bilirubin 2.0 (H) 0.3 - 1.2 mg/dL   GFR, Estimated >60 >60 mL/min    Comment: (NOTE) Calculated using the CKD-EPI Creatinine Equation (2021)    Anion gap 9 5 - 15    Comment: Performed at [facility], [address]  [place], [place] [address]  Ethanol     Status: None   Collection Time: 05/24/22  3:50 PM  Result Value Ref Range   Alcohol, Ethyl (B) <10 <10 mg/dL    Comment: (NOTE) Lowest detectable limit for serum alcohol is 10 mg/dL.  For medical purposes only. Performed at [facility], [address] [place], [place] [address]   Salicylate level  Status: Abnormal   Collection Time: 05/24/22  3:50 PM  Result Value Ref Range   Salicylate Lvl <8.9 (L) 7.0 - 30.0 mg/dL    Comment: Performed at [facility], [address] [place], [place] [address]  Acetaminophen level     Status: Abnormal   Collection Time: 05/24/22  3:50 PM  Result Value Ref Range   Acetaminophen (Tylenol), Serum <10 (L) 10 - 30 ug/mL    Comment: (NOTE) Therapeutic concentrations vary significantly. A range of 10-30 ug/mL  may be an effective concentration for many patients. However, some  are best treated at concentrations outside of this range. Acetaminophen concentrations >150 ug/mL at 4 hours after ingestion  and >50 ug/mL at 12 hours after ingestion are often associated with  toxic reactions.  Performed at [facility], [address] [place], [place] [address]   cbc     Status: Abnormal   Collection Time: 05/24/22  3:50 PM  Result Value Ref Range   WBC 2.9 (L) 4.0 - 10.5 K/uL   RBC 3.57 (L) 4.22 - 5.81 MIL/uL   Hemoglobin 10.7 (L) 13.0 - 17.0 g/dL   HCT 33.6 (L) 39.0 - 52.0 %   MCV 94.1 80.0 - 100.0 fL   MCH 30.0 26.0 - 34.0 pg   MCHC 31.8 30.0 - 36.0 g/dL   RDW 18.2 (H) 11.5 - 15.5 %   Platelets 92 (L) 150 - 400 K/uL    Comment: Immature Platelet Fraction may be clinically indicated, consider ordering this additional test [id number]    nRBC 0.0 0.0 - 0.2 %    Comment: Performed at [facility], [address], [place], [place] [address]  Urine Drug Screen, Qualitative     Status: Abnormal   Collection Time: 05/24/22  5:24 PM  Result Value Ref Range   Tricyclic,  Ur Screen POSITIVE (A) NONE DETECTED   Amphetamines, Ur Screen NONE DETECTED NONE DETECTED   MDMA (Ecstasy)Ur Screen NONE DETECTED NONE DETECTED   Cocaine Metabolite,Ur [place] NONE DETECTED NONE DETECTED   Opiate, Ur Screen NONE DETECTED NONE DETECTED   Phencyclidine (PCP) Ur S NONE DETECTED NONE DETECTED   Cannabinoid 50 Ng, Ur [place] NONE DETECTED NONE DETECTED   Barbiturates, Ur Screen NONE DETECTED NONE DETECTED   Benzodiazepine, Ur Scrn NONE DETECTED NONE DETECTED   Methadone Scn, Ur NONE DETECTED NONE DETECTED    Comment: (NOTE) Tricyclics + metabolites, urine    Cutoff 1000 ng/mL Amphetamines + metabolites, urine  Cutoff 1000 ng/mL MDMA (Ecstasy), urine              Cutoff 500 ng/mL Cocaine Metabolite, urine          Cutoff 300 ng/mL Opiate + metabolites, urine        Cutoff 300 ng/mL Phencyclidine (PCP), urine         Cutoff 25 ng/mL Cannabinoid, urine                 Cutoff 50 ng/mL Barbiturates + metabolites, urine  Cutoff 200 ng/mL Benzodiazepine, urine              Cutoff 200 ng/mL Methadone, urine                   Cutoff 300 ng/mL  The urine drug screen provides only a preliminary, unconfirmed analytical test result and should not be used for non-medical purposes. Clinical consideration and professional judgment should be applied to any positive drug screen result due to possible  interfering substances. A more specific alternate chemical method must be used in order to obtain a confirmed analytical result. Gas chromatography / mass spectrometry (GC/MS) is the preferred confirm atory method. Performed at [facility], [address] [place], [place] [address]   Urinalysis, Routine w reflex microscopic     Status: Abnormal   Collection Time: 05/24/22  5:24 PM  Result Value Ref Range   Color, Urine AMBER (A) YELLOW    Comment: BIOCHEMICALS MAY BE AFFECTED BY COLOR   APPearance CLEAR (A) CLEAR   Specific Gravity, Urine 1.021 1.005 - 1.030   pH 6.0 5.0 - 8.0    Glucose, UA NEGATIVE NEGATIVE mg/dL   Hgb urine dipstick NEGATIVE NEGATIVE   Bilirubin Urine NEGATIVE NEGATIVE   Ketones, ur 5 (A) NEGATIVE mg/dL   Protein, ur 100 (A) NEGATIVE mg/dL   Nitrite NEGATIVE NEGATIVE   Leukocytes,Ua NEGATIVE NEGATIVE   RBC / HPF 0-5 0 - 5 RBC/hpf   WBC, UA 0-5 0 - 5 WBC/hpf   Bacteria, UA NONE SEEN NONE SEEN   Squamous Epithelial / LPF 0-5 0 - 5   Mucus PRESENT     Comment: Performed at [facility], [address] [place], [place] [address]    No current facility-administered medications for this encounter.   Current Outpatient Medications  Medication Sig Dispense Refill   albuterol (PROVENTIL) (2.5 MG/3ML) 0.083% nebulizer solution Take 3 mLs by nebulization every 4 (four) hours as needed for wheezing or shortness of breath. (Patient not taking: Reported on 04/26/2022) 75 mL 0   albuterol (VENTOLIN HFA) 108 (90 Base) MCG/ACT inhaler Inhale 2 puffs into the lungs every 6 (six) hours as needed for wheezing or shortness of breath. 1 each 5   ascorbic acid (VITAMIN C) 500 MG tablet Take 1 tablet (500 mg total) by mouth daily. (Patient not taking: Reported on 04/26/2022)     aspirin EC 81 MG tablet Take 1 tablet (81 mg total) by mouth daily. Swallow whole. 30 tablet 5   atorvastatin (LIPITOR) 40 MG tablet Take 1 tablet (40 mg total) by mouth daily. 30 tablet 5   buprenorphine-naloxone (SUBOXONE) 2-0.5 mg SUBL SL tablet Place 1 tablet under the tongue in the morning and at bedtime. Juanda Crumble drew (Patient not taking: Reported on 04/26/2022)     EPINEPHrine 0.3 mg/0.3 mL IJ SOAJ injection Inject 0.3 mg into the muscle as needed for anaphylaxis. (Patient not taking: Reported on 04/26/2022)     fluticasone (FLONASE) 50 MCG/ACT nasal spray Place 1 spray into both nostrils daily. (Patient not taking: Reported on 04/26/2022)     furosemide (LASIX) 80 MG tablet Take 1 tablet (80 mg total) by mouth daily. 30 tablet 5   glucosamine-chondroitin 500-400 MG tablet Take 1  tablet by mouth 3 (three) times daily. (Patient not taking: Reported on 04/26/2022)     JARDIANCE 10 MG TABS tablet Take 1 tablet (10 mg total) by mouth daily. 30 tablet 5   lisinopril (ZESTRIL) 40 MG tablet Take 1 tablet (40 mg total) by mouth daily. 30 tablet 5   metFORMIN (GLUCOPHAGE-XR) 500 MG 24 hr tablet Take 1 tablet (500 mg total) by mouth daily. 30 tablet 5   Multiple Vitamin (MULTIVITAMIN WITH MINERALS) TABS tablet Take 1 tablet by mouth daily. (Patient not taking: Reported on 04/26/2022)     nitroGLYCERIN (NITROSTAT) 0.4 MG SL tablet Place 1 tablet (0.4 mg total) under the tongue every 5 (five) minutes x 3 doses as needed for chest pain.  30 tablet 5   ondansetron (ZOFRAN-ODT) 4 MG disintegrating tablet Take 1 tablet (4 mg total) by mouth every 6 (six) hours as needed for nausea or vomiting. 20 tablet 0   ondansetron (ZOFRAN-ODT) 4 MG disintegrating tablet Take 1 tablet (4 mg total) by mouth every 8 (eight) hours as needed. 4 tablet 0   PARoxetine (PAXIL) 10 MG tablet Take 1 tablet (10 mg total) by mouth daily. 30 tablet 5   tamsulosin (FLOMAX) 0.4 MG CAPS capsule Take 0.4 mg by mouth daily.      Musculoskeletal: Strength & Muscle Tone: within normal limits Gait & Station: normal Patient leans: N/A   Psychiatric Specialty Exam:  Presentation  General Appearance: Disheveled  Eye Contact:Good  Speech:Clear and Coherent  Speech Volume:Normal  Handedness:Right   Mood and Affect  Mood:Euphoric  Affect:Congruent   Thought Process  Thought Processes:Coherent  Descriptions of Associations:Intact  Orientation:Full (Time, Place and Person)  Thought Content:WDL  History of Schizophrenia/Schizoaffective disorder:No  Duration of Psychotic Symptoms:Less than six months  Hallucinations:Hallucinations: None  Ideas of Reference:None  Suicidal Thoughts:Suicidal Thoughts: No  Homicidal Thoughts:Homicidal Thoughts: No   Sensorium  Memory:Immediate Good; Recent  Good  Judgment:Fair  Insight:Poor   Executive Functions  Concentration:Good  Attention Span:Good  Recall:Good  Fund of Knowledge:Poor  Language:Fair   Psychomotor Activity  Psychomotor Activity:Psychomotor Activity: Normal   Assets  Assets:Financial Resources/Insurance; Resilience   Sleep  Sleep:Sleep: Fair   Physical Exam: Physical Exam Vitals and nursing note reviewed.  HENT:     Head: Normocephalic.     Nose: No congestion or rhinorrhea.  Eyes:     General:        Right eye: No discharge.        Left eye: No discharge.  Cardiovascular:     Rate and Rhythm: Normal rate.  Pulmonary:     Effort: Pulmonary effort is normal.  Musculoskeletal:        General: Normal range of motion.     Cervical back: Normal range of motion.  Skin:    General: Skin is dry.  Neurological:     Mental Status: He is alert and oriented to person, place, and time.  Psychiatric:        Attention and Perception: Attention normal.        Mood and Affect: Mood normal.        Speech: Speech normal.        Behavior: Behavior normal.        Thought Content: Thought content is not paranoid or delusional. Thought content does not include homicidal or suicidal ideation.        Cognition and Memory: Cognition normal.        Judgment: Judgment normal.    Review of Systems  Respiratory: Negative.    Cardiovascular: Negative.   Musculoskeletal:        Says "body hurts all over"  Skin:        Weathered, tan  Psychiatric/Behavioral:  Positive for suicidal ideas (passive d/t homelessness). Negative for hallucinations, memory loss and substance abuse. The patient is not nervous/anxious and does not have insomnia.    Blood pressure 124/76, pulse 85, temperature 98.5 F (36.9 C), temperature source Oral, resp. rate 17, height '5\' 9"'$  (1.753 m), weight 110 kg, SpO2 98 %. Body mass index is 35.81 kg/m.  Treatment Plan Summary: Plan resources for homelessness and outpatient mental health  services provided to patient.  Patient states he is familiar with [facility].  Disposition: No evidence  of imminent risk to self or others at present.   Patient does not meet criteria for psychiatric inpatient admission. Discussed crisis plan, support from social network, calling 911, coming to the Emergency Department, and calling Suicide Hotline.  Sherlon Handing, NP 05/24/2022 5:58 PM

## 2022-05-24 NOTE — ED Notes (Signed)
Patient BP Elevated Provider notified. No new orders. Patient educated and discharged to lobby stated he will catch the bus in the morning.   Support and encouragement provided.

## 2022-05-24 NOTE — ED Notes (Signed)
Consult completed by NP   does not meet inpatient

## 2022-05-24 NOTE — ED Triage Notes (Signed)
Abd pain , no bm in 3 days, pt states pain so bad he wants to cut his wrists with razor

## 2022-05-24 NOTE — ED Notes (Signed)
This tech dressed out pt into [facility] provided scrubs. Pt belongings  Snacks  2 Black sweatshirts Zip lock bag of toothpaste, polygrip, keys, charger, glasses. [place]risun drink pack Cell phone Underwear Plaid pants Pair of black shoes One black t-shirt Black Folded papers

## 2022-05-24 NOTE — ED Provider Notes (Signed)
[facility] Provider Note    Event Date/Time   First MD Initiated Contact with Patient 05/24/22 1638     (approximate)   History   Abdominal Pain (Abd pain , no bm in 7 days, pt states pain so bad he wants to cut his wrists with razor )   HPI  [name] is a 71 y.o. male past medical history of CHF, COPD, asthma, diabetes who presents with multiple complaints.  Patient tells me he has diffuse pain over his entire body involving his abdomen extremities back.  This been going on for several days.  He also endorses abdominal pain that has been going on for about a week.  He feels constipated and is having difficulty urinating.  Tells me he dribbles and is difficult to get out is also painful does not feel that he completely empties his bladder.  This has been going on for some time.  He also endorses shortness of breath.  Denies chest pain.  Patient is homeless and has been for about 6 weeks.  Tells me that him and his girlfriend each pay $20,000 for home and found out that it was a fake realtor and they were kicked out and they now have no money.  He is so frustrated with both his physical complaints and his social situation that he now wants to end his life.  Has thought about using a knife to cut his wrists and neck.  He takes Suboxone for chronic pain.   Patient seen on 9/23 had CT that showed no acute findings there was changes consistent with cirrhosis and portal venous hypertension with small amount of ascites.  Seen again on 9/26 and was treated supportively.    Past Medical History:  Diagnosis Date   Asthma    CHF (congestive heart failure) ([place])    COPD (chronic obstructive pulmonary disease) ([place])    COVID-19 03/2020   diagnosed in August 2021   Diabetes mellitus without complication ([place])    Homelessness    Hypertension    Infestation by bed bug    Migraine    Obesity    Sleep apnea     Patient Active Problem List   Diagnosis Date Noted    Right sided weakness    TIA (transient ischemic attack) 04/28/2022   Obesity    Chronic diastolic CHF (congestive heart failure) ([place])    Depression    Sepsis ([place]) 03/28/2022   Bacteremia due to Klebsiella pneumoniae 03/28/2022   Hypoglycemia 03/28/2022   Syncope 03/24/2022   Hypokalemia 03/24/2022   Fever 03/24/2022   COPD (chronic obstructive pulmonary disease) (HCC)    Elevated troponin    Abnormal LFTs    History of hepatitis C    AKI (acute kidney injury) ([place])    Pure hypercholesterolemia    Pneumonia due to COVID-19 virus 04/19/2020   Elevated transaminase level 04/18/2020   Obesity, Class III, BMI 40-49.9 (morbid obesity) ([place]) 04/18/2020   Lactic acidosis 04/17/2020   Thrombocytopenia ([place]) 04/17/2020   Leukopenia 04/17/2020   COVID-19 virus infection 04/16/2020   HTN (hypertension) 04/17/2018   Uncontrolled type 2 diabetes mellitus with hyperglycemia, without long-term current use of insulin ([place]) 04/17/2018   Lymphedema 04/17/2018   Anemia 04/07/2018   Bilateral leg pain 03/29/2018   Chest pain 03/20/2018   Unsheltered homelessness 12/16/2017     Physical Exam  Triage Vital Signs: ED Triage Vitals  Enc Vitals Group     BP 05/24/22 1543 124/76  Pulse Rate 05/24/22 1543 85     Resp 05/24/22 1543 17     Temp 05/24/22 1543 98.5 F (36.9 C)     Temp Source 05/24/22 1543 Oral     SpO2 05/24/22 1543 98 %     Weight 05/24/22 1544 242 lb 8.1 oz (110 kg)     Height 05/24/22 1544 '5\' 9"'$  (1.753 m)     Head Circumference --      Peak Flow --      Pain Score 05/24/22 1544 8     Pain Loc --      Pain Edu? --      Excl. in [place]? --     Most recent vital signs: Vitals:   05/24/22 1543  BP: 124/76  Pulse: 85  Resp: 17  Temp: 98.5 F (36.9 C)  SpO2: 98%     General: Awake, no distress.  CV:  Good peripheral perfusion.  Venous stasis changes and pitting edema bilaterally Resp:  Normal effort.  No increased work of breathing Abd:  No distention.  Abdomen  is soft minimal tenderness in epigastric region Neuro:             Awake, Alert, Oriented x 3  Other:     ED Results / Procedures / Treatments  Labs (all labs ordered are listed, but only abnormal results are displayed) Labs Reviewed  COMPREHENSIVE METABOLIC PANEL - Abnormal; Notable for the following components:      Result Value   Glucose, Bld 159 (*)    Albumin 2.6 (*)    AST 51 (*)    Total Bilirubin 2.0 (*)    All other components within normal limits  SALICYLATE LEVEL - Abnormal; Notable for the following components:   Salicylate Lvl <6.8 (*)    All other components within normal limits  ACETAMINOPHEN LEVEL - Abnormal; Notable for the following components:   Acetaminophen (Tylenol), Serum <10 (*)    All other components within normal limits  CBC - Abnormal; Notable for the following components:   WBC 2.9 (*)    RBC 3.57 (*)    Hemoglobin 10.7 (*)    HCT 33.6 (*)    RDW 18.2 (*)    Platelets 92 (*)    All other components within normal limits  URINE DRUG SCREEN, QUALITATIVE ([facility] ONLY) - Abnormal; Notable for the following components:   Tricyclic, Ur Screen POSITIVE (*)    All other components within normal limits  URINALYSIS, ROUTINE W REFLEX MICROSCOPIC - Abnormal; Notable for the following components:   Color, Urine AMBER (*)    APPearance CLEAR (*)    Ketones, ur 5 (*)    Protein, ur 100 (*)    All other components within normal limits  SARS CORONAVIRUS 2 BY RT PCR  ETHANOL     EKG  EKG interpretation performed by myself: NSR, nml axis, nml intervals, no acute ischemic changes    RADIOLOGY I reviewed and interpreted the CXR which does not show any acute cardiopulmonary process    PROCEDURES:  Critical Care performed: No  Procedures {   MEDICATIONS ORDERED IN ED: Medications  oxyCODONE-acetaminophen (PERCOCET/ROXICET) 5-325 MG per tablet 1 tablet (1 tablet Oral Given 05/24/22 1747)     IMPRESSION / MDM / ASSESSMENT AND PLAN / ED COURSE  I  reviewed the triage vital signs and the nursing notes.  Patient's presentation is most consistent with acute presentation with potential threat to life or bodily function. Differential diagnosis includes, but is not limited to, chronic pain, malingering, peptic ulcer, viral illness including COVID-19 less likely SBP, HF exacerbation, acute coronary syndrome  Patient is a 71 year old male who does have multiple comorbidities who presents with multiple complaints.  He endorses diffuse body pain abdominal pain constipation difficulty urinating.  He has been seen twice in the ED for abdominal pain with negative CT.  He is now endorsing suicidality because of his frustration and lack of place to stay.  Tells me he takes Suboxone for chronic pain.  Patient is also complaining of shortness of breath.  Vital signs are reassuring overall exam he looks well abdomen exam is benign does have some pitting edema but is not in any respiratory distress.  Labs show stable thrombocytopenia anemia and leukopenia.  Bili mildly elevated at 2.  Ethanol negative Tylenol and salicylate level negative.  Will get EKG chest x-ray and pain with present.  Will involve psychiatry.  We will send COVID 19 test as this could be a viral type illness causing his diffuse body pain.  Ultimately seems like most of his complaints today are chronic and I have low suspicion for acute life-threatening process.  Suspect that this is more related to his social situation.  Patient's COVID test is negative.  Chest x-ray is clear.  EKG nonischemic.  Patient seen by psychiatry and was cleared.  He is requesting to sleep here for several days because he has nowhere to go.  Unfortunately were not able to provide this to him.  Again my suspicion for acute life-threatening process is low so he will be discharged.     FINAL CLINICAL IMPRESSION(S) / ED DIAGNOSES   Final diagnoses:  Abdominal pain, unspecified abdominal  location     Rx / DC Orders   ED Discharge Orders     None        Note:  This document was prepared using Dragon voice recognition software and may include unintentional dictation errors.   Rada Hay, MD 05/24/22 2005

## 2022-05-24 NOTE — ED Notes (Signed)
This tech followed RN's instructions to dress out the pt into hospital provided burgandy scrubs. Pt told this tech he appreciates the "warm, comfy scrubs." Also stated he does "not like to be cold." Pt belongings:  Snacks Two black sweatshirts Ziplock bag of chargers, toothpaste, polygrip cream, glasses Cell phone Caprisun drink pouch Cell phone in tan case Plaid pants Pair of black shoes Black handbag  Pt had underwear but instructed this tech to throw them away.

## 2022-05-24 NOTE — ED Notes (Signed)
Bladder scan post void 137m

## 2022-05-24 NOTE — ED Notes (Signed)
Pt taken for xray

## 2022-05-24 NOTE — BH Assessment (Signed)
Patient was offered resources for homeless shelters. He stated he has tried the local options and they would not allow him to stay because of previous charges (legal system). He also stated, he can not leave the county and live in other shelters because it will violate the conditions of his  probation/parole.

## 2022-05-24 NOTE — ED Notes (Signed)
Belongings returned to Patient. Patient alert and oriented x 3 ambulating with steady gait.

## 2022-05-24 NOTE — ED Notes (Signed)
Pt belongings (additional):  Black watch

## 2022-05-24 NOTE — BH Assessment (Signed)
Comprehensive Clinical Assessment (CCA) Screening, Triage and Referral Note  05/24/2022 [name] [id number]  Chief Complaint:  Chief Complaint  Patient presents with   Abdominal Pain    Abd pain , no bm in 7 days, pt states pain so bad he wants to cut his wrists with razor    Visit Diagnosis: Adjustment Disorder  [name] is a 71 year old male who presents to the ER for not having had a bowel moment in several days. While in the ER he stated, he was having thoughts of ending his life because of the physical pain he is experiencing. When asked about plan for SI, he stated he didn't want share it because "the [place] taught me." Patient further reported, all the hospitals have told him that can't help him because they can't solve his homelessness. He then shared, "I just need somewhere to sleep for a couple of days. I  haven't slept since I got homeless..."  Patient Reported Information How did you hear about [place]? Self  What Is the Reason for Your Visit/Call Today? Patient states he wants to end his life and further reports he need someone stay to sleep. "I haven't slept in days."  How Long Has This Been Causing You Problems? 1 wk - 1 month  What Do You Feel Would Help You the Most Today? Treatment for Depression or other mood problem   Have You Recently Had Any Thoughts About Hurting Yourself? No  Are You Planning to Commit Suicide/Harm Yourself At This time? No   Have you Recently Had Thoughts About [place]? No  Are You Planning to Harm Someone at This Time? No  Explanation: No data recorded  Have You Used Any Alcohol or Drugs in the Past 24 Hours? No  How Long Ago Did You Use Drugs or Alcohol? No data recorded What Did You Use and How Much? No data recorded  Do You Currently Have a Therapist/Psychiatrist? No  Name of Therapist/Psychiatrist: No data recorded  Have You Been Recently Discharged From Any Office Practice or Programs? No  Explanation  of Discharge From Practice/Program: No data recorded   CCA Screening Triage Referral Assessment Type of Contact: Face-to-Face  Telemedicine Service Delivery:   Is this Initial or Reassessment? No data recorded Date Telepsych consult ordered in [facility]:  No data recorded Time Telepsych consult ordered in [facility]:  No data recorded Location of Assessment: [facility] ED  Provider Location: [facility] ED   Collateral Involvement: None provided   Does Patient Have a [place]? No data recorded Name and Contact of Legal Guardian: No data recorded If Minor and Not Living with Parent(s), Who has Custody? n/a  Is CPS involved or ever been involved? Never  Is APS involved or ever been involved? Never   Patient Determined To Be At Risk for Harm To Self or Others Based on Review of Patient Reported Information or Presenting Complaint? No  Method: No data recorded Availability of Means: No data recorded Intent: No data recorded Notification Required: No data recorded Additional Information for Danger to Others Potential: No data recorded Additional Comments for Danger to Others Potential: No data recorded Are There Guns or Other Weapons in Your Home? No data recorded Types of Guns/Weapons: No data recorded Are These Weapons Safely Secured?                            No data recorded Who Could Verify  You Are Able To Have These Secured: No data recorded Do You Have any Outstanding Charges, Pending Court Dates, Parole/Probation? No data recorded Contacted To Inform of Risk of Harm To Self or Others: Family/Significant Other:   Does Patient Present under Involuntary Commitment? No  IVC Papers Initial File Date: No data recorded  [place] of Residence: [place]   Patient Currently Receiving the Following Services: Not Receiving Services   Determination of Need: Emergent (2 hours)   Options For Referral: ED Visit   Discharge Disposition:    Gunnar Fusi MS, LCAS, [facility],  [facility] Therapeutic Triage Specialist 05/24/2022 5:54 PM

## 2022-05-25 ENCOUNTER — Encounter: Payer: Self-pay | Admitting: Family

## 2022-05-25 ENCOUNTER — Ambulatory Visit: Payer: Medicare Other | Attending: Family | Admitting: Family

## 2022-05-25 ENCOUNTER — Telehealth: Payer: Self-pay | Admitting: Family

## 2022-05-25 VITALS — BP 165/75 | HR 59 | Resp 18 | Ht 66.0 in | Wt 254.0 lb

## 2022-05-25 DIAGNOSIS — R5383 Other fatigue: Secondary | ICD-10-CM | POA: Insufficient documentation

## 2022-05-25 DIAGNOSIS — R002 Palpitations: Secondary | ICD-10-CM | POA: Insufficient documentation

## 2022-05-25 DIAGNOSIS — G8929 Other chronic pain: Secondary | ICD-10-CM | POA: Insufficient documentation

## 2022-05-25 DIAGNOSIS — R42 Dizziness and giddiness: Secondary | ICD-10-CM | POA: Diagnosis not present

## 2022-05-25 DIAGNOSIS — F32A Depression, unspecified: Secondary | ICD-10-CM | POA: Insufficient documentation

## 2022-05-25 DIAGNOSIS — G4733 Obstructive sleep apnea (adult) (pediatric): Secondary | ICD-10-CM | POA: Insufficient documentation

## 2022-05-25 DIAGNOSIS — R059 Cough, unspecified: Secondary | ICD-10-CM | POA: Insufficient documentation

## 2022-05-25 DIAGNOSIS — I10 Essential (primary) hypertension: Secondary | ICD-10-CM

## 2022-05-25 DIAGNOSIS — J449 Chronic obstructive pulmonary disease, unspecified: Secondary | ICD-10-CM | POA: Insufficient documentation

## 2022-05-25 DIAGNOSIS — E119 Type 2 diabetes mellitus without complications: Secondary | ICD-10-CM | POA: Diagnosis not present

## 2022-05-25 DIAGNOSIS — R519 Headache, unspecified: Secondary | ICD-10-CM | POA: Diagnosis not present

## 2022-05-25 DIAGNOSIS — R079 Chest pain, unspecified: Secondary | ICD-10-CM | POA: Insufficient documentation

## 2022-05-25 DIAGNOSIS — Z5900 Homelessness unspecified: Secondary | ICD-10-CM | POA: Diagnosis not present

## 2022-05-25 DIAGNOSIS — H538 Other visual disturbances: Secondary | ICD-10-CM | POA: Diagnosis not present

## 2022-05-25 DIAGNOSIS — I110 Hypertensive heart disease with heart failure: Secondary | ICD-10-CM | POA: Insufficient documentation

## 2022-05-25 DIAGNOSIS — R109 Unspecified abdominal pain: Secondary | ICD-10-CM | POA: Diagnosis not present

## 2022-05-25 DIAGNOSIS — I5032 Chronic diastolic (congestive) heart failure: Secondary | ICD-10-CM | POA: Diagnosis present

## 2022-05-25 NOTE — Patient Instructions (Signed)
Continue weighing daily and call for an overnight weight gain of 3 pounds or more or a weekly weight gain of more than 5 pounds.   If you have voicemail, please make sure your mailbox is cleaned out so that we may leave a message and please make sure to listen to any voicemails.     

## 2022-05-25 NOTE — Telephone Encounter (Signed)
Gave patient another 25$ gift card to help him with food.   Lashya Passe, NT

## 2022-05-25 NOTE — Progress Notes (Signed)
Patient ID: Drew Drew, male    DOB: 1950/12/03, 71 y.o.   MRN: [id number]  HPI  Drew Drew is Drew 71 y/o male with Drew history of DM, HTN, COPD, migraines, obstructive sleep apnea and chronic heart failure.  Echo report from 04/01/21 reviewed and showed an EF of 55-60% along with mild LVH/ LAE. Echo report from 03/20/18 reviewed and showed an EF of 60-65% along with mild/moderate Drew and Drew mildly elevated PA pressure of 42 mm Hg  Has been in the ED 7 times this month (September) due to various complaints. ED 04/26/22 non specific chest pain. Was in the ED 3 other times in August. Had 1 admission and 5 ED visits in July.  He presents today for Drew follow-up visit with Drew chief complaint of minimal shortness of breath upon moderate exertion. Describes this as chronic in nature. He has associated fatigue, blurry vision, cough, chest pain, pedal edema, palpitations, abdominal pain, dizziness, headaches, depression, chronic pain and difficulty sleeping along with this. He denies any abdominal distention.   Currently still homeless so unable to weigh. Taking diuretic PRN because he doesn't have access to Drew bathroom and feels like he could get arrested if he gets caught urinating in the woods. Was supposed to be securing housing but his wallet and saved money is in Drew friend's storage building. He currently has no access to his belongings because the friend owes the [place] let him get his belongings out.   Has had numerous ED visits this month and has reports some suicidal thoughts because of his current situation. Psychiatry evaluated him yesterday in the ED.   Past Medical History:  Diagnosis Date   Asthma    CHF (congestive heart failure) (HCC)    COPD (chronic obstructive pulmonary disease) ([place])    COVID-19 03/2020   diagnosed in August 2021   Diabetes mellitus without complication ([place])    Homelessness    Hypertension    Infestation by bed bug    Migraine     Obesity    Sleep apnea    Past Surgical History:  Procedure Laterality Date   CARDIAC CATHETERIZATION     CHOLECYSTECTOMY     EYE SURGERY     INNER EAR SURGERY     NOSE SURGERY     Family History  Problem Relation Age of Onset   Hypertension Mother    Heart disease Mother    Heart failure Maternal Grandmother    Social History   Tobacco Use   Smoking status: Former   Smokeless tobacco: Never  Substance Use Topics   Alcohol use: Never   Allergies  Allergen Reactions   Codeine Itching and Swelling    Rash, Swelling of face/tongue/legs, itching   Novocain [Procaine]    Sulfa Antibiotics Other (See Comments)   Prior to Admission medications   Medication Sig Start Date End Date Taking? Authorizing Provider  albuterol (PROVENTIL) (2.5 MG/3ML) 0.083% nebulizer solution Take 3 mLs by nebulization every 4 (four) hours as needed for wheezing or shortness of breath. 03/10/22  Yes Drew Petersburg, MD  albuterol (VENTOLIN HFA) 108 (90 Base) MCG/ACT inhaler Inhale 2 puffs into the lungs every 6 (six) hours as needed for wheezing or shortness of breath. 04/26/22 05/26/22 Yes Drew Drew, Drew Kluver Drew, Drew Drew  ascorbic acid (VITAMIN C) 500 MG tablet Take 1 tablet (500 mg total) by mouth daily. 04/20/20  Yes Drew Cota, MD  aspirin EC 81 MG  tablet Take 1 tablet (81 mg total) by mouth daily. Swallow whole. 04/26/22  Yes Drew Price Drew, Drew Drew  atorvastatin (LIPITOR) 40 MG tablet Take 1 tablet (40 mg total) by mouth daily. 04/26/22  Yes Drew Drew, Drew Kluver Drew, Drew Drew  buprenorphine-naloxone (SUBOXONE) 2-0.5 mg SUBL SL tablet Place 1 tablet under the tongue in the morning and at bedtime. Drew Drew   Yes [provider]  EPINEPHrine 0.3 mg/0.3 mL IJ SOAJ injection Inject 0.3 mg into the muscle as needed for anaphylaxis.   Yes [provider]  fluticasone (FLONASE) 50 MCG/ACT nasal spray Place 1 spray into both nostrils daily.   Yes [provider]  furosemide (LASIX) 80 MG tablet Take 1  tablet (80 mg total) by mouth daily. Patient taking differently: Take 80 mg by mouth as needed. 04/26/22  Yes Drew Price Drew, Drew Drew  glucosamine-chondroitin 500-400 MG tablet Take 1 tablet by mouth 3 (three) times daily.   Yes [provider]  JARDIANCE 10 MG TABS tablet Take 1 tablet (10 mg total) by mouth daily. 04/26/22  Yes Drew Mittal Drew, Drew Drew  lisinopril (ZESTRIL) 40 MG tablet Take 1 tablet (40 mg total) by mouth daily. 04/26/22  Yes Drew Drew, Drew Kluver Drew, Drew Drew  metFORMIN (GLUCOPHAGE-XR) 500 MG 24 hr tablet Take 1 tablet (500 mg total) by mouth daily. 04/26/22  Yes Drew Graff, Drew Drew  Multiple Vitamin (MULTIVITAMIN WITH MINERALS) TABS tablet Take 1 tablet by mouth daily.   Yes [provider]  nitroGLYCERIN (NITROSTAT) 0.4 MG SL tablet Place 1 tablet (0.4 mg total) under the tongue every 5 (five) minutes x 3 doses as needed for chest pain. 04/26/22  Yes Drew Price Drew, Drew Drew  ondansetron (ZOFRAN-ODT) 4 MG disintegrating tablet Take 1 tablet (4 mg total) by mouth every 6 (six) hours as needed for nausea or vomiting. 05/11/22  Yes Ward, Cyril Mourning N, DO  ondansetron (ZOFRAN-ODT) 4 MG disintegrating tablet Take 1 tablet (4 mg total) by mouth every 8 (eight) hours as needed. 05/19/22  Yes Nena Polio, MD  PARoxetine (PAXIL) 10 MG tablet Take 1 tablet (10 mg total) by mouth daily. 04/26/22  Yes Drew Price Drew, Drew Drew  tamsulosin (FLOMAX) 0.4 MG CAPS capsule Take 0.4 mg by mouth daily.   Yes [provider]    Review of Systems  Constitutional:  Positive for fatigue. Negative for appetite change.  HENT:  Negative for congestion, postnasal drip and sinus pressure.   Eyes:  Positive for visual disturbance (left eye).  Respiratory:  Positive for cough (dry) and shortness of breath (minimal). Negative for chest tightness.   Cardiovascular:  Positive for chest pain, palpitations and leg swelling.  Gastrointestinal:  Positive for abdominal pain. Negative for abdominal distention.   Endocrine: Negative.   Genitourinary: Negative.   Musculoskeletal:  Positive for arthralgias ("joints") and back pain. Negative for neck pain.  Skin: Negative.   Allergic/Immunologic: Negative.   Neurological:  Positive for dizziness and headaches. Negative for light-headedness.  Hematological:  Negative for adenopathy. Does not bruise/bleed easily.  Psychiatric/Behavioral:  Positive for dysphoric mood and sleep disturbance (trouble sleeping; sleeping on 2 pillows). The patient is not nervous/anxious.    Vitals:   05/25/22 1033  BP: (!) 165/75  Pulse: (!) 59  Resp: 18  SpO2: 100%  Weight: 254 lb (115.2 kg)  Height: '5\' 6"'$  (1.676 m)   Wt Readings from Last 3 Encounters:  05/25/22 254 lb (115.2 kg)  05/24/22 242 lb 8.1 oz (110 kg)  05/21/22 255 lb (115.7  kg)   Lab Results  Component Value Date   CREATININE 1.02 05/24/2022   CREATININE 0.93 05/21/2022   CREATININE 0.90 05/19/2022   Physical Exam Vitals and nursing note reviewed.  Constitutional:      Appearance: Normal appearance.     Comments: disheveled  HENT:     Head: Normocephalic and atraumatic.  Cardiovascular:     Rate and Rhythm: Normal rate and regular rhythm.  Pulmonary:     Effort: Pulmonary effort is normal. No respiratory distress.     Breath sounds: Normal breath sounds. No wheezing or rales.  Abdominal:     General: There is no distension.     Palpations: Abdomen is soft.     Tenderness: There is no abdominal tenderness.  Musculoskeletal:        General: No tenderness.     Cervical back: Normal range of motion and neck supple.     Right lower leg: No tenderness. Edema (1+ pitting) present.     Left lower leg: No tenderness. Edema (1+ pitting) present.  Skin:    General: Skin is warm and dry.  Neurological:     General: No focal deficit present.     Mental Status: He is alert and oriented to person, place, and time.  Psychiatric:        Mood and Affect: Mood normal.        Behavior: Behavior  normal.        Thought Content: Thought content normal.    Assessment & Plan:  1: Chronic heart failure with preserved ejection fraction with structural changes (LVH/LAE)- - NYHA class II - euvolemic today - weight down 16 pounds from last visit here 1 month ago  - not adding salt to his food but admits finances are tight & he can't always eat low sodium foods; can't always have any food to eat in general - working with paramedicine program - on GDMT of jardiance and lisinopril  - had telemedicine visit with cardiology [name]) 10/06/20 - BNP 05/10/22 was 376.7  2: HTN- - BP elevated (165/75) - taking diuretic PRN due to no access to Drew bathroom - sees PCP at [facility] 05/24/22 reviewed and showed sodium 140, potassium 3.6, creatinine 1.02 and GFR >60  3: DM- - A1c 03/26/22 was 5.4% - not monitoring blood sugars - glucose in the ED last night was 170  4: Homelessness- - 1 gift card provided today so that he can get some food - currently can't get his wallet, ID card etc because they are in Drew friend's storage bldg and this friend owes money on the bldg - does get around using public bus transportation - says that his girlfriend is supposed to secure housing on 11/1 and then he can go live with her - has charges pending so can not go to homeless shelter nor leave the county because of probation   Return in 3 months, sooner if needed.

## 2022-06-13 ENCOUNTER — Ambulatory Visit: Payer: Medicare Other | Admitting: Urology

## 2022-06-13 ENCOUNTER — Encounter: Payer: Self-pay | Admitting: Urology

## 2022-06-13 NOTE — Congregational Nurse Program (Signed)
  Dept: [phone]   Congregational Nurse Program Note  Date of Encounter: 06/13/2022 Client to [facility] clinic to follow up on Prevent Blindness application. Certificate was received and given to client he will use the bus to go to [employer] at [facility]. No other needs at this time. Past Medical History: Past Medical History:  Diagnosis Date   Asthma    CHF (congestive heart failure) ([place])    COPD (chronic obstructive pulmonary disease) ([place])    COVID-19 03/2020   diagnosed in August 2021   Diabetes mellitus without complication ([place])    Homelessness    Hypertension    Infestation by bed bug    Migraine    Obesity    Sleep apnea     Encounter Details:  CNP Questionnaire - 06/13/22 1030       Questionnaire   Ask client: Do you give verbal consent for me to treat you today? Yes    Student Assistance N/A    Location Patient [place]    Visit Setting with Client Organization    Patient Status Unhoused    Insurance [place];Medicare    Insurance/Financial Assistance Referral N/A    Medication N/A    Medical Provider Yes   reports he is a client at [place] Appointment Made N/A   discussed contacting Princella Ion for this client, he declined at time of visit   Recently w/o PCP, now 1st time PCP visit completed due to CNs referral or appointment made [place] Insecurities    Transportation N/A   currenlty using the [employer] bus system   Housing/Utilities No permanent housing    Interpersonal Safety N/A    Interventions Case Management    Abnormal to Normal Screening Since Last CN Visit N/A    Screenings CN Performed N/A    Sent Client to Lab for: N/A    Did client attend any of the following based off CNs referral or appointments made? N/A    ED Visit Averted N/A    Life-Saving Intervention Made N/A

## 2022-06-26 ENCOUNTER — Other Ambulatory Visit: Payer: Self-pay

## 2022-06-27 ENCOUNTER — Telehealth (HOSPITAL_COMMUNITY): Payer: Self-pay

## 2022-06-27 NOTE — Telephone Encounter (Signed)
Attempted several times but phone number is not working.  Will continue to try, maybe out of minutes since end of month.  Unsure where he is living or still homeless.   [place] [phone]

## 2022-07-01 ENCOUNTER — Other Ambulatory Visit: Payer: Self-pay

## 2022-07-01 ENCOUNTER — Emergency Department
Admission: EM | Admit: 2022-07-01 | Discharge: 2022-07-01 | Disposition: A | Discharge status: Home / Self Care | Payer: Medicare Other | Attending: Emergency Medicine | Admitting: Emergency Medicine

## 2022-07-01 DIAGNOSIS — E119 Type 2 diabetes mellitus without complications: Secondary | ICD-10-CM | POA: Insufficient documentation

## 2022-07-01 DIAGNOSIS — T63441A Toxic effect of venom of bees, accidental (unintentional), initial encounter: Secondary | ICD-10-CM | POA: Insufficient documentation

## 2022-07-01 DIAGNOSIS — Z79899 Other long term (current) drug therapy: Secondary | ICD-10-CM | POA: Insufficient documentation

## 2022-07-01 DIAGNOSIS — Z8616 Personal history of COVID-19: Secondary | ICD-10-CM | POA: Insufficient documentation

## 2022-07-01 DIAGNOSIS — R1084 Generalized abdominal pain: Secondary | ICD-10-CM | POA: Diagnosis not present

## 2022-07-01 DIAGNOSIS — Z7984 Long term (current) use of oral hypoglycemic drugs: Secondary | ICD-10-CM | POA: Insufficient documentation

## 2022-07-01 DIAGNOSIS — I5032 Chronic diastolic (congestive) heart failure: Secondary | ICD-10-CM | POA: Diagnosis not present

## 2022-07-01 DIAGNOSIS — R197 Diarrhea, unspecified: Secondary | ICD-10-CM | POA: Insufficient documentation

## 2022-07-01 DIAGNOSIS — J449 Chronic obstructive pulmonary disease, unspecified: Secondary | ICD-10-CM | POA: Diagnosis not present

## 2022-07-01 DIAGNOSIS — Z7982 Long term (current) use of aspirin: Secondary | ICD-10-CM | POA: Diagnosis not present

## 2022-07-01 DIAGNOSIS — R112 Nausea with vomiting, unspecified: Secondary | ICD-10-CM | POA: Insufficient documentation

## 2022-07-01 DIAGNOSIS — I110 Hypertensive heart disease with heart failure: Secondary | ICD-10-CM | POA: Diagnosis not present

## 2022-07-01 LAB — CBC WITH DIFFERENTIAL/PLATELET
Abs Immature Granulocytes: 0.01 10*3/uL (ref 0.00–0.07)
Basophils Absolute: 0 10*3/uL (ref 0.0–0.1)
Basophils Relative: 1 %
Eosinophils Absolute: 0.1 10*3/uL (ref 0.0–0.5)
Eosinophils Relative: 3 %
HCT: 32.5 % — ABNORMAL LOW (ref 39.0–52.0)
Hemoglobin: 10.6 g/dL — ABNORMAL LOW (ref 13.0–17.0)
Immature Granulocytes: 0 %
Lymphocytes Relative: 25 %
Lymphs Abs: 0.9 10*3/uL (ref 0.7–4.0)
MCH: 29.9 pg (ref 26.0–34.0)
MCHC: 32.6 g/dL (ref 30.0–36.0)
MCV: 91.5 fL (ref 80.0–100.0)
Monocytes Absolute: 0.6 10*3/uL (ref 0.1–1.0)
Monocytes Relative: 17 %
Neutro Abs: 1.8 10*3/uL (ref 1.7–7.7)
Neutrophils Relative %: 54 %
Platelets: 76 10*3/uL — ABNORMAL LOW (ref 150–400)
RBC: 3.55 MIL/uL — ABNORMAL LOW (ref 4.22–5.81)
RDW: 18.4 % — ABNORMAL HIGH (ref 11.5–15.5)
WBC: 3.4 10*3/uL — ABNORMAL LOW (ref 4.0–10.5)
nRBC: 0 % (ref 0.0–0.2)

## 2022-07-01 LAB — COMPREHENSIVE METABOLIC PANEL
ALT: 22 U/L (ref 0–44)
AST: 43 U/L — ABNORMAL HIGH (ref 15–41)
Albumin: 2.4 g/dL — ABNORMAL LOW (ref 3.5–5.0)
Alkaline Phosphatase: 90 U/L (ref 38–126)
Anion gap: 5 (ref 5–15)
BUN: 11 mg/dL (ref 8–23)
CO2: 24 mmol/L (ref 22–32)
Calcium: 9.1 mg/dL (ref 8.9–10.3)
Chloride: 112 mmol/L — ABNORMAL HIGH (ref 98–111)
Creatinine, Ser: 1.02 mg/dL (ref 0.61–1.24)
GFR, Estimated: >60 mL/min (ref >60)
Glucose, Bld: 122 mg/dL — ABNORMAL HIGH (ref 70–99)
Potassium: 3.6 mmol/L (ref 3.5–5.1)
Sodium: 141 mmol/L (ref 135–145)
Total Bilirubin: 1.3 mg/dL — ABNORMAL HIGH (ref 0.3–1.2)
Total Protein: 6.4 g/dL — ABNORMAL LOW (ref 6.5–8.1)

## 2022-07-01 LAB — LIPASE, BLOOD: Lipase: 38 U/L (ref 11–51)

## 2022-07-01 MED ORDER — ONDANSETRON 4 MG PO TBDP
4.0000 mg | ORAL_TABLET | Freq: Once | ORAL | Status: AC
  Administered 2022-07-01: 4 mg via ORAL
  Filled 2022-07-01: qty 1

## 2022-07-01 MED ORDER — EPINEPHRINE 0.3 MG/0.3ML IJ SOAJ: 0.3000 mg | Freq: Once | INTRAMUSCULAR | 0 refills | Status: AC

## 2022-07-01 NOTE — Discharge Instructions (Signed)
Use EpiPen in case of acute, allergic reaction which makes it hard for you to breathe.  Return to the ER for worsening symptoms, persistent vomiting, difficulty breathing or other concerns.

## 2022-07-01 NOTE — ED Provider Notes (Signed)
[facility] Provider Note    Event Date/Time   First MD Initiated Contact with Patient 07/01/22 0220     (approximate)   History   Abdominal Pain (Pt arrived to the ED and has been placed in room 12, with a c/o abd pain s/p bee sting and subsequent epi pen usage. Per EMS report the pt stated being stung approximately one hour prior to his arrival to the ED. The pt endorsed using an epi pen s/p the bee sting. He denies any SOB, swelling to the face or neck area, airway is open and patent. )   HPI  [name] is a 71 y.o. male brought to the ED from home with a chief complaint of bee sting, abdominal pain, nausea/vomiting/diarrhea.  Patient with a history of CHF, homelessness who is well-known to the emergency department.  States he does not have a residence, was drinking a soda on the porch prior to arrival when he got bit on the lower lip by a bee.  Of note, the weather outside is mid 67 F.  Claims to have given himself an EpiPen.  Complains of abdominal pain, nausea/vomiting/diarrhea.  Denies chest pain, shortness of breath, hives.  EMS reports no power or heat to the house.  Patient confirms no power to the house but states he is capable of having heat with wood-burning stove.     Past Medical History   Past Medical History:  Diagnosis Date   Asthma    CHF (congestive heart failure) ([place])    COPD (chronic obstructive pulmonary disease) ([place])    COVID-19 03/2020   diagnosed in August 2021   Diabetes mellitus without complication ([place])    Homelessness    Hypertension    Infestation by bed bug    Migraine    Obesity    Sleep apnea      Active Problem List   Patient Active Problem List   Diagnosis Date Noted   Right sided weakness    TIA (transient ischemic attack) 04/28/2022   Obesity    Chronic diastolic CHF (congestive heart failure) ([place])    Depression    Sepsis ([place]) 03/28/2022   Bacteremia due to Klebsiella pneumoniae 03/28/2022    Hypoglycemia 03/28/2022   Syncope 03/24/2022   Hypokalemia 03/24/2022   Fever 03/24/2022   COPD (chronic obstructive pulmonary disease) (HCC)    Elevated troponin    Abnormal LFTs    History of hepatitis C    AKI (acute kidney injury) ([place])    Pure hypercholesterolemia    Pneumonia due to COVID-19 virus 04/19/2020   Elevated transaminase level 04/18/2020   Obesity, Class III, BMI 40-49.9 (morbid obesity) ([place]) 04/18/2020   Lactic acidosis 04/17/2020   Thrombocytopenia ([place]) 04/17/2020   Leukopenia 04/17/2020   COVID-19 virus infection 04/16/2020   HTN (hypertension) 04/17/2018   Uncontrolled type 2 diabetes mellitus with hyperglycemia, without long-term current use of insulin ([place]) 04/17/2018   Lymphedema 04/17/2018   Anemia 04/07/2018   Bilateral leg pain 03/29/2018   Chest pain 03/20/2018   Unsheltered homelessness 12/16/2017     Past Surgical History   Past Surgical History:  Procedure Laterality Date   CARDIAC CATHETERIZATION     CHOLECYSTECTOMY     EYE SURGERY     INNER EAR SURGERY     NOSE SURGERY       Home Medications   Prior to Admission medications   Medication Sig Start Date End Date Taking? Authorizing Provider  EPINEPHrine  0.3 mg/0.3 mL IJ SOAJ injection Inject 0.3 mg into the muscle once for 1 dose. 07/01/22 07/01/22 Yes Paulette Blanch, MD  albuterol (PROVENTIL) (2.5 MG/3ML) 0.083% nebulizer solution Take 3 mLs by nebulization every 4 (four) hours as needed for wheezing or shortness of breath. 03/10/22   Vanessa [place], MD  albuterol (VENTOLIN HFA) 108 (90 Base) MCG/ACT inhaler Inhale 2 puffs into the lungs every 6 (six) hours as needed for wheezing or shortness of breath. 04/26/22 05/26/22  Alisa Graff, FNP  ascorbic acid (VITAMIN C) 500 MG tablet Take 1 tablet (500 mg total) by mouth daily. 04/20/20   Samuella Cota, MD  aspirin EC 81 MG tablet Take 1 tablet (81 mg total) by mouth daily. Swallow whole. 04/26/22   Alisa Graff, FNP  atorvastatin  (LIPITOR) 40 MG tablet Take 1 tablet (40 mg total) by mouth daily. 04/26/22   Alisa Graff, FNP  buprenorphine-naloxone (SUBOXONE) 2-0.5 mg SUBL SL tablet Place 1 tablet under the tongue in the morning and at bedtime. [name]    [provider]  fluticasone (FLONASE) 50 MCG/ACT nasal spray Place 1 spray into both nostrils daily.    [provider]  furosemide (LASIX) 80 MG tablet Take 1 tablet (80 mg total) by mouth daily. Patient taking differently: Take 80 mg by mouth as needed. 04/26/22   Alisa Graff, FNP  glucosamine-chondroitin 500-400 MG tablet Take 1 tablet by mouth 3 (three) times daily.    [provider]  JARDIANCE 10 MG TABS tablet Take 1 tablet (10 mg total) by mouth daily. 04/26/22   Alisa Graff, FNP  lisinopril (ZESTRIL) 40 MG tablet Take 1 tablet (40 mg total) by mouth daily. 04/26/22   Alisa Graff, FNP  metFORMIN (GLUCOPHAGE-XR) 500 MG 24 hr tablet Take 1 tablet (500 mg total) by mouth daily. 04/26/22   Alisa Graff, FNP  Multiple Vitamin (MULTIVITAMIN WITH MINERALS) TABS tablet Take 1 tablet by mouth daily.    [provider]  nitroGLYCERIN (NITROSTAT) 0.4 MG SL tablet Place 1 tablet (0.4 mg total) under the tongue every 5 (five) minutes x 3 doses as needed for chest pain. 04/26/22   Alisa Graff, FNP  ondansetron (ZOFRAN-ODT) 4 MG disintegrating tablet Take 1 tablet (4 mg total) by mouth every 6 (six) hours as needed for nausea or vomiting. 05/11/22   Ward, Delice Bison, DO  PARoxetine (PAXIL) 10 MG tablet Take 1 tablet (10 mg total) by mouth daily. 04/26/22   Alisa Graff, FNP  tamsulosin (FLOMAX) 0.4 MG CAPS capsule Take 0.4 mg by mouth daily.    [provider]     Allergies  Bee venom, Codeine, Novocain [procaine], and Sulfa antibiotics   Family History   Family History  Problem Relation Age of Onset   Hypertension Mother    Heart disease Mother    Heart failure Maternal Grandmother      Physical Exam   Triage Vital Signs: ED Triage Vitals [07/01/22 0218]  Enc Vitals Group     BP (!) 153/67     Pulse Rate 61     Resp 16     Temp 98.3 F (36.8 C)     Temp Source Oral     SpO2 94 %     Weight      Height      Head Circumference      Peak Flow      Pain Score  Pain Loc      Pain Edu?      Excl. in [place]?     Updated Vital Signs: BP (!) 153/67 (BP Location: Left Arm)   Pulse 61   Temp 98.3 F (36.8 C) (Oral)   Resp 16   Ht '5\' 6"'$  (1.676 m)   Wt 107 kg   SpO2 94%   BMI 38.07 kg/m    General: Awake, no distress.  CV:  RRR.  Good peripheral perfusion.  Resp:  Normal effort.  CTA B. Abd:  Nontender to light or deep palpation.  No distention.  Other:  No appreciable lower lip edema.  Posterior oropharynx clear.  Phonation normal.  There is no hoarse or muffled voice.  There is no drooling.  Tolerating secretions well.  No submental swelling.  Unable to visualize site of EpiPen insertion; patient states it was anterior right thigh but there is no puncture mark.   ED Results / Procedures / Treatments  Labs (all labs ordered are listed, but only abnormal results are displayed) Labs Reviewed  CBC WITH DIFFERENTIAL/PLATELET - Abnormal; Notable for the following components:      Result Value   WBC 3.4 (*)    RBC 3.55 (*)    Hemoglobin 10.6 (*)    HCT 32.5 (*)    RDW 18.4 (*)    Platelets 76 (*)    All other components within normal limits  COMPREHENSIVE METABOLIC PANEL - Abnormal; Notable for the following components:   Chloride 112 (*)    Glucose, Bld 122 (*)    Total Protein 6.4 (*)    Albumin 2.4 (*)    AST 43 (*)    Total Bilirubin 1.3 (*)    All other components within normal limits  LIPASE, BLOOD     EKG  None   RADIOLOGY None   Official radiology report(s): No results found.   PROCEDURES:  Critical Care performed: No  Procedures   MEDICATIONS ORDERED IN ED: Medications  ondansetron (ZOFRAN-ODT) disintegrating tablet 4 mg (4 mg Oral  Given 07/01/22 0233)     IMPRESSION / MDM / ASSESSMENT AND PLAN / ED COURSE  I reviewed the triage vital signs and the nursing notes.                             71 year old male presenting with abdominal pain, N/V/D, states lower lip edema secondary to bee sting status post EpiPen. Differential diagnosis includes, but is not limited to, acute appendicitis, renal colic, testicular torsion, urinary tract infection/pyelonephritis, prostatitis,  epididymitis, diverticulitis, small bowel obstruction or ileus, colitis, abdominal aortic aneurysm, gastroenteritis, hernia, etc. I have personally reviewed patient's records and note a CHF office visit on 05/25/2022.  There are numerous ED visits for abdominal pain, nausea, vomiting and diarrhea, notably 05/24/2022, 05/22/2022, 05/19/2022, 05/10/2022.  Patient's presentation is most consistent with acute presentation with potential threat to life or bodily function.  Patient without appreciable lower lip swelling or evidence that he injected EpiPen.  Given his age and comorbidities, will obtain basic lab work and observe in the emergency department.  Would not administer IV allergic reaction cocktail at this time.  Would also not obtain imaging at this time.  Clinical Course as of 07/01/22 9935  Sun Jul 01, 2022  0448 No swelling of the lower lip.  Room air saturation 95%.  No hives.  We will continue to monitor and care for patient.  Laboratory results demonstrate  stable thrombocytopenia. [JS]  0609 No lower lip swelling.  Room air saturation 96%.  Abdominal cramping has resolved.  Posterior oropharynx remains clear.  Phonation intact.  Tolerating secretions well.  Patient is safe for discharge home.  Will refill EpiPen.  Strict return precautions given.  Patient verbalizes understanding and agrees with plan of care. [JS]    Clinical Course User Index [JS] Paulette Blanch, MD     FINAL CLINICAL IMPRESSION(S) / ED DIAGNOSES   Final diagnoses:  Generalized  abdominal pain  Nausea vomiting and diarrhea  Bee sting reaction, accidental or unintentional, initial encounter     Rx / DC Orders   ED Discharge Orders          Ordered    EPINEPHrine 0.3 mg/0.3 mL IJ SOAJ injection   Once        07/01/22 7026             Note:  This document was prepared using Dragon voice recognition software and may include unintentional dictation errors.   Paulette Blanch, MD 07/01/22 [phone]

## 2022-07-01 NOTE — ED Notes (Signed)
Pt able to tolerate po intake of ginger ale

## 2022-07-10 ENCOUNTER — Telehealth (HOSPITAL_COMMUNITY): Payer: Self-pay

## 2022-07-10 NOTE — Telephone Encounter (Signed)
Have attempted several times, no answer.  Will continue to try to reach.   [place] [phone]

## 2022-07-14 ENCOUNTER — Other Ambulatory Visit: Payer: Self-pay

## 2022-07-14 ENCOUNTER — Encounter: Payer: Self-pay | Admitting: Intensive Care

## 2022-07-14 ENCOUNTER — Emergency Department: Payer: Medicare Other

## 2022-07-14 ENCOUNTER — Emergency Department
Admission: EM | Admit: 2022-07-14 | Discharge: 2022-07-14 | Disposition: A | Discharge status: Home / Self Care | Payer: Medicare Other | Attending: Emergency Medicine | Admitting: Emergency Medicine

## 2022-07-14 DIAGNOSIS — R109 Unspecified abdominal pain: Secondary | ICD-10-CM | POA: Insufficient documentation

## 2022-07-14 DIAGNOSIS — E119 Type 2 diabetes mellitus without complications: Secondary | ICD-10-CM | POA: Diagnosis not present

## 2022-07-14 DIAGNOSIS — I110 Hypertensive heart disease with heart failure: Secondary | ICD-10-CM | POA: Diagnosis not present

## 2022-07-14 DIAGNOSIS — R911 Solitary pulmonary nodule: Secondary | ICD-10-CM | POA: Insufficient documentation

## 2022-07-14 DIAGNOSIS — S2222XA Fracture of body of sternum, initial encounter for closed fracture: Secondary | ICD-10-CM | POA: Diagnosis not present

## 2022-07-14 DIAGNOSIS — Y92410 Unspecified street and highway as the place of occurrence of the external cause: Secondary | ICD-10-CM | POA: Insufficient documentation

## 2022-07-14 DIAGNOSIS — J449 Chronic obstructive pulmonary disease, unspecified: Secondary | ICD-10-CM | POA: Diagnosis not present

## 2022-07-14 DIAGNOSIS — I509 Heart failure, unspecified: Secondary | ICD-10-CM | POA: Diagnosis not present

## 2022-07-14 DIAGNOSIS — W208XXA Other cause of strike by thrown, projected or falling object, initial encounter: Secondary | ICD-10-CM | POA: Insufficient documentation

## 2022-07-14 DIAGNOSIS — R778 Other specified abnormalities of plasma proteins: Secondary | ICD-10-CM | POA: Diagnosis not present

## 2022-07-14 DIAGNOSIS — Z5900 Homelessness unspecified: Secondary | ICD-10-CM | POA: Diagnosis not present

## 2022-07-14 DIAGNOSIS — S299XXA Unspecified injury of thorax, initial encounter: Secondary | ICD-10-CM | POA: Diagnosis present

## 2022-07-14 DIAGNOSIS — W19XXXA Unspecified fall, initial encounter: Secondary | ICD-10-CM

## 2022-07-14 LAB — CBC WITH DIFFERENTIAL/PLATELET
Abs Immature Granulocytes: 0.01 10*3/uL (ref 0.00–0.07)
Basophils Absolute: 0 10*3/uL (ref 0.0–0.1)
Basophils Relative: 1 %
Eosinophils Absolute: 0 10*3/uL (ref 0.0–0.5)
Eosinophils Relative: 1 %
HCT: 32.9 % — ABNORMAL LOW (ref 39.0–52.0)
Hemoglobin: 10.7 g/dL — ABNORMAL LOW (ref 13.0–17.0)
Immature Granulocytes: 0 %
Lymphocytes Relative: 24 %
Lymphs Abs: 0.8 10*3/uL (ref 0.7–4.0)
MCH: 29.7 pg (ref 26.0–34.0)
MCHC: 32.5 g/dL (ref 30.0–36.0)
MCV: 91.4 fL (ref 80.0–100.0)
Monocytes Absolute: 0.5 10*3/uL (ref 0.1–1.0)
Monocytes Relative: 14 %
Neutro Abs: 1.9 10*3/uL (ref 1.7–7.7)
Neutrophils Relative %: 60 %
Platelets: 93 10*3/uL — ABNORMAL LOW (ref 150–400)
RBC: 3.6 MIL/uL — ABNORMAL LOW (ref 4.22–5.81)
RDW: 18.3 % — ABNORMAL HIGH (ref 11.5–15.5)
WBC: 3.2 10*3/uL — ABNORMAL LOW (ref 4.0–10.5)
nRBC: 0 % (ref 0.0–0.2)

## 2022-07-14 LAB — COMPREHENSIVE METABOLIC PANEL
ALT: 36 U/L (ref 0–44)
AST: 103 U/L — ABNORMAL HIGH (ref 15–41)
Albumin: 2.7 g/dL — ABNORMAL LOW (ref 3.5–5.0)
Alkaline Phosphatase: 80 U/L (ref 38–126)
Anion gap: 6 (ref 5–15)
BUN: 17 mg/dL (ref 8–23)
CO2: 27 mmol/L (ref 22–32)
Calcium: 9.6 mg/dL (ref 8.9–10.3)
Chloride: 107 mmol/L (ref 98–111)
Creatinine, Ser: 0.93 mg/dL (ref 0.61–1.24)
GFR, Estimated: >60 mL/min (ref >60)
Glucose, Bld: 108 mg/dL — ABNORMAL HIGH (ref 70–99)
Potassium: 3.7 mmol/L (ref 3.5–5.1)
Sodium: 140 mmol/L (ref 135–145)
Total Bilirubin: 2.8 mg/dL — ABNORMAL HIGH (ref 0.3–1.2)
Total Protein: 6.7 g/dL (ref 6.5–8.1)

## 2022-07-14 LAB — TROPONIN I (HIGH SENSITIVITY)
Troponin I (High Sensitivity): 26 ng/L — ABNORMAL HIGH
Troponin I (High Sensitivity): 30 ng/L — ABNORMAL HIGH

## 2022-07-14 MED ORDER — ONDANSETRON HCL 4 MG/2ML IJ SOLN
4.0000 mg | Freq: Once | INTRAMUSCULAR | Status: AC
  Administered 2022-07-14: 4 mg via INTRAVENOUS
  Filled 2022-07-14: qty 2

## 2022-07-14 MED ORDER — IOHEXOL 300 MG/ML  SOLN
100.0000 mL | Freq: Once | INTRAMUSCULAR | Status: AC | PRN
  Administered 2022-07-14: 100 mL via INTRAVENOUS

## 2022-07-14 MED ORDER — OXYCODONE-ACETAMINOPHEN 5-325 MG PO TABS: 1.0000 | ORAL_TABLET | ORAL | 0 refills | Status: DC | PRN

## 2022-07-14 MED ORDER — MORPHINE SULFATE (PF) 4 MG/ML IV SOLN
4.0000 mg | Freq: Once | INTRAVENOUS | Status: AC
  Administered 2022-07-14: 4 mg via INTRAVENOUS
  Filled 2022-07-14: qty 1

## 2022-07-14 NOTE — Discharge Instructions (Signed)
Follow-up with your regular doctor if not improving 3 days.  Return emergency department worsening.  Take the pain medication as needed.  May also take ibuprofen.  Apply ice to the area. Continue all of your regular daily medications

## 2022-07-14 NOTE — ED Provider Notes (Signed)
[facility] Provider Note    Event Date/Time   First MD Initiated Contact with Patient 07/14/22 1258     (approximate)   History   Fall   HPI  [name] is a 71 y.o. male with history of hypertension, COPD, homelessness, CHF, diabetes presents emergency department stating that he fell directly on a rock 3 days ago.  Has large bruise concern was chestnuts had some pain and specially pain with breathing.  Is also some abdominal pain.  No fever or chills.      Physical Exam   Triage Vital Signs: ED Triage Vitals  Enc Vitals Group     BP 07/14/22 1222 (!) 144/66     Pulse Rate 07/14/22 1222 (!) 59     Resp 07/14/22 1222 15     Temp 07/14/22 1222 98.3 F (36.8 C)     Temp Source 07/14/22 1222 Oral     SpO2 07/14/22 1222 96 %     Weight 07/14/22 1222 235 lb (106.6 kg)     Height 07/14/22 1222 '5\' 6"'$  (1.676 m)     Head Circumference --      Peak Flow --      Pain Score 07/14/22 1231 10     Pain Loc --      Pain Edu? --      Excl. in [place]? --     Most recent vital signs: Vitals:   07/14/22 1720 07/14/22 1805  BP: (!) 172/82 (!) 170/80  Pulse: 74 75  Resp: 18 16  Temp:  98 F (36.7 C)  SpO2: 98% 98%     General: Awake, no distress.   CV:  Good peripheral perfusion. regular rate and  rhythm Resp:  Normal effort. Lungs CTA Abd:  No distention.   Other:  Bruise noted across the sternum, area is very tender to palpation   ED Results / Procedures / Treatments   Labs (all labs ordered are listed, but only abnormal results are displayed) Labs Reviewed  COMPREHENSIVE METABOLIC PANEL - Abnormal; Notable for the following components:      Result Value   Glucose, Bld 108 (*)    Albumin 2.7 (*)    AST 103 (*)    Total Bilirubin 2.8 (*)    All other components within normal limits  CBC WITH DIFFERENTIAL/PLATELET - Abnormal; Notable for the following components:   WBC 3.2 (*)    RBC 3.60 (*)    Hemoglobin 10.7 (*)    HCT 32.9 (*)     RDW 18.3 (*)    Platelets 93 (*)    All other components within normal limits  TROPONIN I (HIGH SENSITIVITY) - Abnormal; Notable for the following components:   Troponin I (High Sensitivity) 26 (*)    All other components within normal limits  TROPONIN I (HIGH SENSITIVITY) - Abnormal; Notable for the following components:   Troponin I (High Sensitivity) 30 (*)    All other components within normal limits     EKG  EKG   RADIOLOGY CT of the chest, CT abdomen and pelvis with IV contrast    PROCEDURES:   Procedures   MEDICATIONS ORDERED IN ED: Medications  iohexol (OMNIPAQUE) 300 MG/ML solution 100 mL (100 mLs Intravenous Contrast Given 07/14/22 1622)  ondansetron (ZOFRAN) injection 4 mg (4 mg Intravenous Given 07/14/22 1749)  morphine (PF) 4 MG/ML injection 4 mg (4 mg Intravenous Given 07/14/22 1749)     IMPRESSION / MDM / ASSESSMENT AND  PLAN / ED COURSE  I reviewed the triage vital signs and the nursing notes.                              Differential diagnosis includes, but is not limited to, contusion, fracture, MI  Patient's presentation is most consistent with acute complicated illness / injury requiring diagnostic workup.   CT of the chest shows a sternal fracture that is not displaced, this was independently reviewed and interpreted by me  Due to the abdominal pain did a CT abdomen and pelvis with IV contrast.  EKG shows bradycardia, no STEMI  Although the patient's troponin is elevated the first troponin was 26 and second was 30 so this is hemodynamically stable, CBC is in alignment with his normal CBC levels.  When I reviewed his past medical charts the numbers are very similar.  Comprehensive metabolic panel appears to be normal.  CT of the abdomen and pelvis with IV contrast not show any acute abnormalities.  Does show pulmonary nodule which I may do ambulatory referral for the patient follow-up with the pulmonary nodule clinic.  He was given a  prescription for pain medication.  He had pain control with 4 of morphine IV along with Zofran.  He was discharged in stable condition.  I did offer admission for pain control but the patient states he does not think he needs to be admitted.      FINAL CLINICAL IMPRESSION(S) / ED DIAGNOSES   Final diagnoses:  Fracture of body of sternum, initial encounter for closed fracture  Pulmonary nodule  Fall, initial encounter     Rx / DC Orders   ED Discharge Orders          Ordered    oxyCODONE-acetaminophen (PERCOCET) 5-325 MG tablet  Every 4 hours PRN        07/14/22 1809    AMB  Referral to Pulmonary Nodule Clinic        Pending             Note:  This document was prepared using Dragon voice recognition software and may include unintentional dictation errors.    Versie Starks, PA-C 07/14/22 Rosezetta Schlatter, MD 07/14/22 [phone]

## 2022-07-14 NOTE — ED Triage Notes (Signed)
[place] the side of [place] road with c/o fall 3 days ago. Pt fell on a rock with his chest, pt has bruising and reports painful to take a deep breath. 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 Pt also reports vomiting for last 3 days. 170/72, HR 75, 98% RA, CBG 129

## 2022-07-14 NOTE — ED Provider Triage Note (Signed)
Emergency Medicine Provider Triage Evaluation Note  [name] , a 71 y.o. male  was evaluated in triage.  Pt complains of chest pain from a fall, states has bruise on his chest.  States area is very tender.  Denies shortness of breath.  Review of Systems  Positive:  Negative:   Physical Exam  BP (!) 144/66 (BP Location: Left Arm)   Pulse (!) 59   Temp 98.3 F (36.8 C) (Oral)   Resp 15   Ht '5\' 6"'$  (1.676 m)   Wt 106.6 kg   SpO2 96%   BMI 37.93 kg/m  Gen:   Awake, no distress   Resp:  Normal effort  MSK:   Moves extremities without difficulty  Other:    Medical Decision Making  Medically screening exam initiated at 12:35 PM.  Appropriate orders placed.  [name] was informed that the remainder of the evaluation will be completed by another provider, this initial triage assessment does not replace that evaluation, and the importance of remaining in the ED until their evaluation is complete.     Versie Starks, PA-C 07/14/22 1236

## 2022-07-14 NOTE — ED Notes (Signed)
Pt in CT.

## 2022-08-02 NOTE — Congregational Nurse Program (Signed)
  Dept: [phone]   Congregational Nurse Program Note  Date of Encounter: 08/02/2022 Client to [facility] day center with report that he had been hospitalized when he was injured by a falling tree. Chart notes reviewed, client was seen in the [facility] Ed on 11/18 and did suffer a fractured sternum. He reports he still has difficulty getting comfortable when sleeping. No shortness of breath or cough. RN discussed the importance of deep breaths. He reports having been seen by his PCP Veda Canning at [facility]. Client requested to have his temperature taken. T 98.1 temporal. No other needs at this time. Past Medical History: Past Medical History:  Diagnosis Date   Asthma    CHF (congestive heart failure) ([place])    COPD (chronic obstructive pulmonary disease) ([place])    COVID-19 03/2020   diagnosed in August 2021   Diabetes mellitus without complication ([place])    Homelessness    Hypertension    Infestation by bed bug    Migraine    Obesity    Sleep apnea     Encounter Details:  CNP Questionnaire - 08/02/22 1030       Questionnaire   Ask client: Do you give verbal consent for me to treat you today? Yes    Student Assistance N/A    Location Patient [name]    Visit Setting with Client Organization    Patient Status Unhoused   client [place] Medicaid;Medicare    Insurance/Financial Assistance Referral N/A    Medication N/A    Medical Provider Yes   Princella Ion   Screening Referrals Made N/A    Medical Referrals Made N/A    Medical Appointment Made N/A   discussed contacting Princella Ion for this client, he declined at time of visit   Recently w/o PCP, now 1st time PCP visit completed due to CNs referral or appointment made N/A    Food Have Food Insecurities    Transportation N/A   currenlty using the [place] bus system   Housing/Utilities No permanent housing    Interpersonal Safety N/A     Interventions Advocate/Support   client reported being in the ED, was seen on 11/18   Abnormal to Normal Screening Since Last CN Visit N/A    Screenings CN Performed N/A    Sent Client to Lab for: N/A    Did client attend any of the following based off CNs referral or appointments made? N/A    ED Visit Averted N/A    Life-Saving Intervention Made N/A

## 2022-08-09 ENCOUNTER — Other Ambulatory Visit: Payer: Self-pay

## 2022-08-09 ENCOUNTER — Emergency Department: Payer: Medicare Other

## 2022-08-09 ENCOUNTER — Emergency Department
Admission: EM | Admit: 2022-08-09 | Discharge: 2022-08-09 | Disposition: A | Discharge status: Home / Self Care | Payer: Medicare Other | Attending: Emergency Medicine | Admitting: Emergency Medicine

## 2022-08-09 DIAGNOSIS — W19XXXA Unspecified fall, initial encounter: Secondary | ICD-10-CM | POA: Diagnosis not present

## 2022-08-09 DIAGNOSIS — R0600 Dyspnea, unspecified: Secondary | ICD-10-CM | POA: Insufficient documentation

## 2022-08-09 DIAGNOSIS — R0789 Other chest pain: Secondary | ICD-10-CM | POA: Diagnosis not present

## 2022-08-09 DIAGNOSIS — I5032 Chronic diastolic (congestive) heart failure: Secondary | ICD-10-CM | POA: Insufficient documentation

## 2022-08-09 DIAGNOSIS — K529 Noninfective gastroenteritis and colitis, unspecified: Secondary | ICD-10-CM

## 2022-08-09 DIAGNOSIS — R197 Diarrhea, unspecified: Secondary | ICD-10-CM | POA: Diagnosis not present

## 2022-08-09 DIAGNOSIS — J45909 Unspecified asthma, uncomplicated: Secondary | ICD-10-CM | POA: Insufficient documentation

## 2022-08-09 DIAGNOSIS — R109 Unspecified abdominal pain: Secondary | ICD-10-CM | POA: Diagnosis not present

## 2022-08-09 DIAGNOSIS — R112 Nausea with vomiting, unspecified: Secondary | ICD-10-CM | POA: Insufficient documentation

## 2022-08-09 DIAGNOSIS — Z8616 Personal history of COVID-19: Secondary | ICD-10-CM | POA: Insufficient documentation

## 2022-08-09 DIAGNOSIS — S0990XA Unspecified injury of head, initial encounter: Secondary | ICD-10-CM | POA: Diagnosis present

## 2022-08-09 DIAGNOSIS — J449 Chronic obstructive pulmonary disease, unspecified: Secondary | ICD-10-CM | POA: Diagnosis not present

## 2022-08-09 DIAGNOSIS — I110 Hypertensive heart disease with heart failure: Secondary | ICD-10-CM | POA: Insufficient documentation

## 2022-08-09 DIAGNOSIS — R059 Cough, unspecified: Secondary | ICD-10-CM | POA: Insufficient documentation

## 2022-08-09 DIAGNOSIS — Y92009 Unspecified place in unspecified non-institutional (private) residence as the place of occurrence of the external cause: Secondary | ICD-10-CM | POA: Diagnosis not present

## 2022-08-09 DIAGNOSIS — E119 Type 2 diabetes mellitus without complications: Secondary | ICD-10-CM | POA: Insufficient documentation

## 2022-08-09 LAB — CBC
HCT: 37.4 % — ABNORMAL LOW (ref 39.0–52.0)
Hemoglobin: 12.3 g/dL — ABNORMAL LOW (ref 13.0–17.0)
MCH: 30.9 pg (ref 26.0–34.0)
MCHC: 32.9 g/dL (ref 30.0–36.0)
MCV: 94 fL (ref 80.0–100.0)
Platelets: 116 10*3/uL — ABNORMAL LOW (ref 150–400)
RBC: 3.98 MIL/uL — ABNORMAL LOW (ref 4.22–5.81)
RDW: 17.9 % — ABNORMAL HIGH (ref 11.5–15.5)
WBC: 3.7 10*3/uL — ABNORMAL LOW (ref 4.0–10.5)
nRBC: 0 % (ref 0.0–0.2)

## 2022-08-09 LAB — COMPREHENSIVE METABOLIC PANEL
ALT: 23 U/L (ref 0–44)
AST: 50 U/L — ABNORMAL HIGH (ref 15–41)
Albumin: 2.8 g/dL — ABNORMAL LOW (ref 3.5–5.0)
Alkaline Phosphatase: 97 U/L (ref 38–126)
Anion gap: 6 (ref 5–15)
BUN: 10 mg/dL (ref 8–23)
CO2: 22 mmol/L (ref 22–32)
Calcium: 8.7 mg/dL — ABNORMAL LOW (ref 8.9–10.3)
Chloride: 111 mmol/L (ref 98–111)
Creatinine, Ser: 0.83 mg/dL (ref 0.61–1.24)
GFR, Estimated: >60 mL/min (ref >60)
Glucose, Bld: 81 mg/dL (ref 70–99)
Potassium: 3.7 mmol/L (ref 3.5–5.1)
Sodium: 139 mmol/L (ref 135–145)
Total Bilirubin: 2.7 mg/dL — ABNORMAL HIGH (ref 0.3–1.2)
Total Protein: 7.3 g/dL (ref 6.5–8.1)

## 2022-08-09 LAB — LIPASE, BLOOD: Lipase: 30 U/L (ref 11–51)

## 2022-08-09 MED ORDER — LACTATED RINGERS IV BOLUS
1000.0000 mL | Freq: Once | INTRAVENOUS | Status: AC
  Administered 2022-08-09: 1000 mL via INTRAVENOUS

## 2022-08-09 MED ORDER — MORPHINE SULFATE (PF) 4 MG/ML IV SOLN
4.0000 mg | Freq: Once | INTRAVENOUS | Status: AC
  Administered 2022-08-09: 4 mg via INTRAVENOUS
  Filled 2022-08-09: qty 1

## 2022-08-09 MED ORDER — ONDANSETRON HCL 4 MG/2ML IJ SOLN
4.0000 mg | Freq: Once | INTRAMUSCULAR | Status: AC
  Administered 2022-08-09: 4 mg via INTRAVENOUS
  Filled 2022-08-09: qty 2

## 2022-08-09 MED ORDER — IOHEXOL 300 MG/ML  SOLN
75.0000 mL | Freq: Once | INTRAMUSCULAR | Status: AC | PRN
  Administered 2022-08-09: 75 mL via INTRAVENOUS

## 2022-08-09 MED ORDER — ONDANSETRON 4 MG PO TBDP: 4.0000 mg | ORAL_TABLET | Freq: Three times a day (TID) | ORAL | 0 refills | Status: DC | PRN

## 2022-08-09 NOTE — ED Provider Notes (Signed)
[facility] Provider Note    Event Date/Time   First MD Initiated Contact with Patient 08/09/22 1145     (approximate)   History   Nausea   HPI  [name] is a 71 y.o. male  with pmh asthma, COPD, CHF with last EF normal, hypertension homelessness who presents because of nausea vomiting and diarrhea.  Patient tells me that for several days he has not been able to keep anything down episodes of emesis multiple loose stools.  Diarrhea is nonbloody.  He has no associate abdominal pain.  Does have pain over left chest wall.  He had a fall on 11/18 and had nondisplaced sternal fracture he was seen in the ED for this.  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  Does feel mildly dyspneic and has cough productive of yellow sputum.  Denies urinary symptoms still making urine.     Past Medical History:  Diagnosis Date   Asthma    CHF (congestive heart failure) ([place])    COPD (chronic obstructive pulmonary disease) ([place])    COVID-19 03/2020   diagnosed in August 2021   Diabetes mellitus without complication ([place])    Homelessness    Hypertension    Infestation by bed bug    Migraine    Obesity    Sleep apnea     Patient Active Problem List   Diagnosis Date Noted   Right sided weakness    TIA (transient ischemic attack) 04/28/2022   Obesity    Chronic diastolic CHF (congestive heart failure) ([place])    Depression    Sepsis ([place]) 03/28/2022   Bacteremia due to Klebsiella pneumoniae 03/28/2022   Hypoglycemia 03/28/2022   Syncope 03/24/2022   Hypokalemia 03/24/2022   Fever 03/24/2022   COPD (chronic obstructive pulmonary disease) (HCC)    Elevated troponin    Abnormal LFTs    History of hepatitis C    AKI (acute kidney injury) ([place])    Pure hypercholesterolemia    Pneumonia due to COVID-19 virus 04/19/2020   Elevated transaminase level 04/18/2020   Obesity, Class III,  BMI 40-49.9 (morbid obesity) ([place]) 04/18/2020   Lactic acidosis 04/17/2020   Thrombocytopenia ([place]) 04/17/2020   Leukopenia 04/17/2020   COVID-19 virus infection 04/16/2020   HTN (hypertension) 04/17/2018   Uncontrolled type 2 diabetes mellitus with hyperglycemia, without long-term current use of insulin ([place]) 04/17/2018   Lymphedema 04/17/2018   Anemia 04/07/2018   Bilateral leg pain 03/29/2018   Chest pain 03/20/2018   Unsheltered homelessness 12/16/2017     Physical Exam  Triage Vital Signs: ED Triage Vitals  Enc Vitals Group     BP 08/09/22 1105 (!) 146/70     Pulse Rate 08/09/22 1105 (!) 56     Resp 08/09/22 1105 18     Temp 08/09/22 1105 98.3 F (36.8 C)     Temp Source 08/09/22 1105 Oral     SpO2 08/09/22 1105 98 %     Weight 08/09/22 1106 250 lb (113.4 kg)     Height --      Head Circumference --      Peak Flow --      Pain Score 08/09/22 1106 9     Pain Loc --      Pain Edu? --      Excl. in [place]? --     Most recent vital signs:  Vitals:   08/09/22 1105  BP: (!) 146/70  Pulse: (!) 56  Resp: 18  Temp: 98.3 F (36.8 C)  SpO2: 98%     General: Awake, no distress.  CV:  Good peripheral perfusion.  Resp:  Normal effort.  Lungs are clear Abd:  No distention.  Abdomen is soft and nontender throughout Neuro:             Awake, Alert, Oriented x 3  Other:  Mild tenderness over the anterior chest wall without crepitus   ED Results / Procedures / Treatments  Labs (all labs ordered are listed, but only abnormal results are displayed) Labs Reviewed  COMPREHENSIVE METABOLIC PANEL - Abnormal; Notable for the following components:      Result Value   Calcium 8.7 (*)    Albumin 2.8 (*)    AST 50 (*)    Total Bilirubin 2.7 (*)    All other components within normal limits  CBC - Abnormal; Notable for the following components:   WBC 3.7 (*)    RBC 3.98 (*)    Hemoglobin 12.3 (*)    HCT 37.4 (*)    RDW 17.9 (*)    Platelets 116 (*)    All other components  within normal limits  LIPASE, BLOOD  URINALYSIS, ROUTINE W REFLEX MICROSCOPIC     EKG  EKG interpretation performed by myself: NSR, nml axis, nml intervals, no acute ischemic changes    RADIOLOGY I reviewed and interpreted the CXR which does not show any acute cardiopulmonary process    PROCEDURES:  Critical Care performed: No  .1-3 Lead EKG Interpretation  Performed by: Rada Hay, MD Authorized by: Rada Hay, MD     Interpretation: normal     ECG rate assessment: normal     Rhythm: sinus rhythm     Ectopy: none     Conduction: normal     The patient is on the cardiac monitor to evaluate for evidence of arrhythmia and/or significant heart rate changes.   MEDICATIONS ORDERED IN ED: Medications  lactated ringers bolus 1,000 mL (1,000 mLs Intravenous New Bag/Given 08/09/22 1238)  ondansetron (ZOFRAN) injection 4 mg (4 mg Intravenous Given 08/09/22 1238)  morphine (PF) 4 MG/ML injection 4 mg (4 mg Intravenous Given 08/09/22 1238)  iohexol (OMNIPAQUE) 300 MG/ML solution 75 mL (75 mLs Intravenous Contrast Given 08/09/22 1244)     IMPRESSION / MDM / ASSESSMENT AND PLAN / ED COURSE  I reviewed the triage vital signs and the nursing notes.                              Patient's presentation is most consistent with acute complicated illness / injury requiring diagnostic workup.  Differential diagnosis includes, but is not limited to, gastroenteritis, covid, flu, hypovolemia, electrolyte nonverbally, AKI, COPD exacerbation, pneumonia, rib fracture, pneumothorax  The patient is a 71 year old male who does have multiple medical comorbidities and is homeless presents because of nausea vomiting diarrhea inability tolerate p.o. and rib pain.  He had a recent sternal fracture from a fall fell again several days ago and reinjured this when he was raking and has had ongoing pain there as well as cough productive of yellow sputum.  He is also had multiple episodes  of vomiting diarrhea inability tolerate p.o. but no abdominal pain.  Patient's vital signs are overall reassuring.  His abdomen is soft nontender.  Does have diffuse chest wall tenderness but  no crepitus no bruising and his lungs are clear.  Plan to give liter fluid Zofran and pain medication.  Chest x-ray was obtained does not show any obvious pneumothorax rib fracture but given significant pain will obtain CT chest with contrast, as well as CT head.  CT of the chest does not show any new injury, sternal fracture is healing.  Patient's labs overall are reassuring there is no AKI, no leukocytosis cytosis does have mild leukopenia but this has been chronic.  On further questioning patient tells me he has been having diarrhea for 6 months.  Think it is less likely to be acutely infectious.  He will need to see GI.  Patient is tolerating PO here do not feel that he requires admission.      FINAL CLINICAL IMPRESSION(S) / ED DIAGNOSES   Final diagnoses:  Chronic diarrhea     Rx / DC Orders   ED Discharge Orders          Ordered    ondansetron (ZOFRAN-ODT) 4 MG disintegrating tablet  Every 8 hours PRN        08/09/22 1405             Note:  This document was prepared using Dragon voice recognition software and may include unintentional dictation errors.   Rada Hay, MD 08/09/22 [phone]

## 2022-08-09 NOTE — ED Triage Notes (Signed)
Pt sts that he has been having N/V/D for the last two days. Pt sts that he has been falling at home and already has cracked ribs but thinks there is more as he is in more pain since his last fall.

## 2022-08-12 ENCOUNTER — Emergency Department: Payer: Medicare Other

## 2022-08-12 ENCOUNTER — Other Ambulatory Visit: Payer: Self-pay

## 2022-08-12 ENCOUNTER — Emergency Department
Admission: EM | Admit: 2022-08-12 | Discharge: 2022-08-29 | Disposition: A | Discharge status: Home / Self Care | Payer: Medicare Other | Attending: Emergency Medicine | Admitting: Emergency Medicine

## 2022-08-12 DIAGNOSIS — Z79899 Other long term (current) drug therapy: Secondary | ICD-10-CM | POA: Insufficient documentation

## 2022-08-12 DIAGNOSIS — Z5900 Homelessness unspecified: Secondary | ICD-10-CM

## 2022-08-12 DIAGNOSIS — Z1152 Encounter for screening for COVID-19: Secondary | ICD-10-CM | POA: Diagnosis not present

## 2022-08-12 DIAGNOSIS — W01198A Fall on same level from slipping, tripping and stumbling with subsequent striking against other object, initial encounter: Secondary | ICD-10-CM | POA: Diagnosis not present

## 2022-08-12 DIAGNOSIS — S0990XA Unspecified injury of head, initial encounter: Secondary | ICD-10-CM | POA: Insufficient documentation

## 2022-08-12 DIAGNOSIS — S299XXA Unspecified injury of thorax, initial encounter: Secondary | ICD-10-CM | POA: Diagnosis not present

## 2022-08-12 DIAGNOSIS — J449 Chronic obstructive pulmonary disease, unspecified: Secondary | ICD-10-CM | POA: Diagnosis not present

## 2022-08-12 DIAGNOSIS — S3991XA Unspecified injury of abdomen, initial encounter: Secondary | ICD-10-CM | POA: Insufficient documentation

## 2022-08-12 DIAGNOSIS — G8929 Other chronic pain: Secondary | ICD-10-CM

## 2022-08-12 DIAGNOSIS — W19XXXA Unspecified fall, initial encounter: Secondary | ICD-10-CM

## 2022-08-12 LAB — COMPREHENSIVE METABOLIC PANEL
ALT: 23 U/L (ref 0–44)
AST: 52 U/L — ABNORMAL HIGH (ref 15–41)
Albumin: 2.8 g/dL — ABNORMAL LOW (ref 3.5–5.0)
Alkaline Phosphatase: 98 U/L (ref 38–126)
Anion gap: 3 — ABNORMAL LOW (ref 5–15)
BUN: 11 mg/dL (ref 8–23)
CO2: 26 mmol/L (ref 22–32)
Calcium: 9.1 mg/dL (ref 8.9–10.3)
Chloride: 109 mmol/L (ref 98–111)
Creatinine, Ser: 0.96 mg/dL (ref 0.61–1.24)
GFR, Estimated: >60 mL/min (ref >60)
Glucose, Bld: 91 mg/dL (ref 70–99)
Potassium: 3.6 mmol/L (ref 3.5–5.1)
Sodium: 138 mmol/L (ref 135–145)
Total Bilirubin: 3 mg/dL — ABNORMAL HIGH (ref 0.3–1.2)
Total Protein: 7.1 g/dL (ref 6.5–8.1)

## 2022-08-12 LAB — URINE DRUG SCREEN, QUALITATIVE (ARMC ONLY)
Amphetamines, Ur Screen: NOT DETECTED
Barbiturates, Ur Screen: NOT DETECTED
Benzodiazepine, Ur Scrn: NOT DETECTED
Cannabinoid 50 Ng, Ur ~~LOC~~: NOT DETECTED
Cocaine Metabolite,Ur ~~LOC~~: NOT DETECTED
MDMA (Ecstasy)Ur Screen: NOT DETECTED
Methadone Scn, Ur: NOT DETECTED
Opiate, Ur Screen: NOT DETECTED
Phencyclidine (PCP) Ur S: NOT DETECTED
Tricyclic, Ur Screen: NOT DETECTED

## 2022-08-12 LAB — CBC WITH DIFFERENTIAL/PLATELET
Abs Immature Granulocytes: 0.01 10*3/uL (ref 0.00–0.07)
Basophils Absolute: 0 10*3/uL (ref 0.0–0.1)
Basophils Relative: 1 %
Eosinophils Absolute: 0.1 10*3/uL (ref 0.0–0.5)
Eosinophils Relative: 2 %
HCT: 37.8 % — ABNORMAL LOW (ref 39.0–52.0)
Hemoglobin: 12.2 g/dL — ABNORMAL LOW (ref 13.0–17.0)
Immature Granulocytes: 0 %
Lymphocytes Relative: 32 %
Lymphs Abs: 1 10*3/uL (ref 0.7–4.0)
MCH: 31 pg (ref 26.0–34.0)
MCHC: 32.3 g/dL (ref 30.0–36.0)
MCV: 96.2 fL (ref 80.0–100.0)
Monocytes Absolute: 0.4 10*3/uL (ref 0.1–1.0)
Monocytes Relative: 14 %
Neutro Abs: 1.6 10*3/uL — ABNORMAL LOW (ref 1.7–7.7)
Neutrophils Relative %: 51 %
Platelets: 103 10*3/uL — ABNORMAL LOW (ref 150–400)
RBC: 3.93 MIL/uL — ABNORMAL LOW (ref 4.22–5.81)
RDW: 18.1 % — ABNORMAL HIGH (ref 11.5–15.5)
WBC: 3.2 10*3/uL — ABNORMAL LOW (ref 4.0–10.5)
nRBC: 0 % (ref 0.0–0.2)

## 2022-08-12 LAB — AMMONIA: Ammonia: 11 umol/L (ref 9–35)

## 2022-08-12 LAB — TROPONIN I (HIGH SENSITIVITY)
Troponin I (High Sensitivity): 13 ng/L (ref <18)
Troponin I (High Sensitivity): 14 ng/L (ref <18)

## 2022-08-12 LAB — ETHANOL: Alcohol, Ethyl (B): <10 mg/dL (ref <10)

## 2022-08-12 MED ORDER — FENTANYL CITRATE PF 50 MCG/ML IJ SOSY
75.0000 ug | PREFILLED_SYRINGE | Freq: Once | INTRAMUSCULAR | Status: AC
  Administered 2022-08-12: 75 ug via INTRAVENOUS
  Filled 2022-08-12: qty 2

## 2022-08-12 MED ORDER — SODIUM CHLORIDE 0.9 % IV BOLUS
1000.0000 mL | Freq: Once | INTRAVENOUS | Status: AC
  Administered 2022-08-12: 1000 mL via INTRAVENOUS

## 2022-08-12 MED ORDER — IOHEXOL 300 MG/ML  SOLN
100.0000 mL | Freq: Once | INTRAMUSCULAR | Status: AC | PRN
  Administered 2022-08-12: 100 mL via INTRAVENOUS

## 2022-08-12 MED ORDER — ONDANSETRON HCL 4 MG/2ML IJ SOLN
4.0000 mg | Freq: Once | INTRAMUSCULAR | Status: AC
  Administered 2022-08-12: 4 mg via INTRAVENOUS
  Filled 2022-08-12: qty 2
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
## 2022-08-12 NOTE — ED Notes (Signed)
Pt had black pocket knife used for opening pills, given to security at front.

## 2022-08-13 DIAGNOSIS — S0990XA Unspecified injury of head, initial encounter: Secondary | ICD-10-CM | POA: Diagnosis not present

## 2022-08-13 LAB — URINALYSIS, ROUTINE W REFLEX MICROSCOPIC
Bacteria, UA: NONE SEEN
Bilirubin Urine: NEGATIVE
Glucose, UA: NEGATIVE mg/dL
Hgb urine dipstick: NEGATIVE
Ketones, ur: NEGATIVE mg/dL
Leukocytes,Ua: NEGATIVE
Nitrite: NEGATIVE
Protein, ur: 100 mg/dL — AB
Specific Gravity, Urine: 1.027 (ref 1.005–1.030)
Squamous Epithelial / HPF: NONE SEEN (ref 0–5)
pH: 5 (ref 5.0–8.0)

## 2022-08-13 LAB — CBG MONITORING, ED
Glucose-Capillary: 82 mg/dL (ref 70–99)
Glucose-Capillary: 113 mg/dL — ABNORMAL HIGH (ref 70–99)
Glucose-Capillary: 124 mg/dL — ABNORMAL HIGH (ref 70–99)
Glucose-Capillary: 112 mg/dL — ABNORMAL HIGH (ref 70–99)

## 2022-08-13 MED ORDER — PAROXETINE HCL 10 MG PO TABS
10.0000 mg | ORAL_TABLET | Freq: Every day | ORAL | Status: DC
  Administered 2022-08-13 – 2022-08-28 (×15): 10 mg via ORAL
  Filled 2022-08-13 (×18): qty 1

## 2022-08-13 MED ORDER — ASPIRIN 81 MG PO TBEC
81.0000 mg | DELAYED_RELEASE_TABLET | Freq: Every day | ORAL | Status: DC
  Administered 2022-08-13 – 2022-08-28 (×16): 81 mg via ORAL
  Filled 2022-08-13 (×16): qty 1

## 2022-08-13 MED ORDER — LISINOPRIL 10 MG PO TABS
40.0000 mg | ORAL_TABLET | Freq: Every day | ORAL | Status: DC
  Administered 2022-08-13 – 2022-08-28 (×16): 40 mg via ORAL
  Filled 2022-08-13 (×16): qty 4

## 2022-08-13 MED ORDER — ALBUTEROL SULFATE HFA 108 (90 BASE) MCG/ACT IN AERS: 2.0000 | INHALATION_SPRAY | Freq: Four times a day (QID) | RESPIRATORY_TRACT | Status: DC | PRN

## 2022-08-13 MED ORDER — INSULIN ASPART 100 UNIT/ML IJ SOLN
0.0000 [IU] | Freq: Three times a day (TID) | INTRAMUSCULAR | Status: DC
  Administered 2022-08-13: 2 [IU] via SUBCUTANEOUS
  Administered 2022-08-14: 5 [IU] via SUBCUTANEOUS
  Administered 2022-08-18: 3 [IU] via SUBCUTANEOUS
  Administered 2022-08-18: 2 [IU] via SUBCUTANEOUS
  Administered 2022-08-19: 3 [IU] via SUBCUTANEOUS
  Administered 2022-08-21 – 2022-08-24 (×3): 2 [IU] via SUBCUTANEOUS
  Filled 2022-08-13 (×10): qty 1

## 2022-08-13 MED ORDER — METFORMIN HCL ER 500 MG PO TB24
500.0000 mg | ORAL_TABLET | Freq: Every day | ORAL | Status: DC
  Administered 2022-08-15 – 2022-08-28 (×14): 500 mg via ORAL
  Filled 2022-08-13 (×16): qty 1

## 2022-08-13 MED ORDER — TAMSULOSIN HCL 0.4 MG PO CAPS
0.4000 mg | ORAL_CAPSULE | Freq: Every day | ORAL | Status: DC
  Administered 2022-08-13 – 2022-08-28 (×16): 0.4 mg via ORAL
  Filled 2022-08-13 (×16): qty 1

## 2022-08-13 MED ORDER — ACETAMINOPHEN 325 MG PO TABS
650.0000 mg | ORAL_TABLET | Freq: Once | ORAL | Status: AC
  Administered 2022-08-13: 650 mg via ORAL
  Filled 2022-08-13: qty 2

## 2022-08-13 MED ORDER — ATORVASTATIN CALCIUM 20 MG PO TABS
40.0000 mg | ORAL_TABLET | Freq: Every day | ORAL | Status: DC
  Administered 2022-08-13 – 2022-08-28 (×16): 40 mg via ORAL
  Filled 2022-08-13 (×16): qty 2

## 2022-08-13 NOTE — ED Notes (Signed)
Pt given dinner tray and beverage  

## 2022-08-13 NOTE — ED Provider Notes (Signed)
-----------------------------------------   5:17 AM on 08/13/2022 -----------------------------------------   Blood pressure 139/80, pulse (!) 57, temperature 97.9 F (36.6 C), temperature source Oral, resp. rate (!) 25, height '5\' 6"'$  (1.676 m), weight 113 kg, SpO2 97 %.  The patient is calm and cooperative at this time.  There have been no acute events since the last update.  Awaiting disposition plan from Social Work team.   Paulette Blanch, MD 08/13/22 [phone]

## 2022-08-13 NOTE — ED Notes (Signed)
Pt ambulatory to restroom with steady gait.

## 2022-08-13 NOTE — ED Notes (Addendum)
Fsbs 124   meds given, dinner meal given to pt   iv in place  pt calm and cooperative. t

## 2022-08-13 NOTE — ED Notes (Signed)
Hospital meal provided.  100% consumed, pt tolerated w/o complaints.  Waste discarded appropriately.   
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
## 2022-08-13 NOTE — ED Notes (Signed)
Snack provided with beverage

## 2022-08-14 DIAGNOSIS — S0990XA Unspecified injury of head, initial encounter: Secondary | ICD-10-CM | POA: Diagnosis not present

## 2022-08-14 LAB — CBG MONITORING, ED
Glucose-Capillary: 80 mg/dL (ref 70–99)
Glucose-Capillary: 88 mg/dL (ref 70–99)
Glucose-Capillary: 204 mg/dL — ABNORMAL HIGH (ref 70–99)

## 2022-08-14 MED ORDER — ACETAMINOPHEN 325 MG PO TABS
650.0000 mg | ORAL_TABLET | Freq: Once | ORAL | Status: AC
  Administered 2022-08-14: 650 mg via ORAL
  Filled 2022-08-14: qty 2

## 2022-08-14 MED ORDER — MELATONIN 5 MG PO TABS
5.0000 mg | ORAL_TABLET | Freq: Once | ORAL | Status: AC
  Administered 2022-08-14: 5 mg via ORAL
  Filled 2022-08-14: qty 1

## 2022-08-14 NOTE — ED Notes (Addendum)
Pt ambulatory to restroom with steady gait; was able to get onto stretcher without assistance.

## 2022-08-14 NOTE — ED Provider Notes (Signed)
Emergency Medicine Observation Re-evaluation Note  [name] is a 71 y.o. male, seen on rounds today.  Pt initially presented to the ED for complaints of Fall   Physical Exam  BP 139/80   Pulse (!) 57   Temp 97.9 F (36.6 C) (Oral)   Resp (!) 25   Ht '5\' 6"'$  (1.676 m)   Wt 113 kg   SpO2 97%   BMI 40.21 kg/m   The patient is calm and cooperative at this time.  There have been no acute events since the last update.  Awaiting disposition plan from Social Work team.     Vanessa [place], MD 08/14/22 0200

## 2022-08-14 NOTE — ED Notes (Signed)
Breakfast given.  

## 2022-08-14 NOTE — ED Notes (Signed)
Pt up to restroom with steady gait. No distress noted at this time.
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
## 2022-08-14 NOTE — ED Notes (Signed)
Pt given lunch tray and drink

## 2022-08-14 NOTE — ED Notes (Signed)
Pt given a [place] sandwich tray and a cup of diet sprite.

## 2022-08-14 NOTE — NC FL2 (Signed)
[place] LEVEL OF CARE FORM     IDENTIFICATION  Patient Name: [name] Birthdate: [birth year] Sex: male Admission Date (Current Location): 08/12/2022  [place] and [place] Number:  Engineering geologist and Address:  [facility], [address] [place], [place] [address]      Provider Number: [id number]  Attending Physician Name and Address:  No att. providers found  Relative Name and Phone Number:       Current Level of Care: Other (Comment) (ED boarder) Recommended Level of Care: [place], Ray[place] Prior Approval Number:    Date Approved/Denied:   PASRR Number:    Discharge Plan: Other (Comment) (FCH/ALF)    Current Diagnoses: Patient Active Problem List   Diagnosis Date Noted   Right sided weakness    TIA (transient ischemic attack) 04/28/2022   Obesity    Chronic diastolic CHF (congestive heart failure) ([place])    Depression    Sepsis ([place]) 03/28/2022   Bacteremia due to Klebsiella pneumoniae 03/28/2022   Hypoglycemia 03/28/2022   Syncope 03/24/2022   Hypokalemia 03/24/2022   Fever 03/24/2022   COPD (chronic obstructive pulmonary disease) (HCC)    Elevated troponin    Abnormal LFTs    History of hepatitis C    AKI (acute kidney injury) ([place])    Pure hypercholesterolemia    Pneumonia due to COVID-19 virus 04/19/2020   Elevated transaminase level 04/18/2020   Obesity, Class III, BMI 40-49.9 (morbid obesity) ([place]) 04/18/2020   Lactic acidosis 04/17/2020   Thrombocytopenia ([place]) 04/17/2020   Leukopenia 04/17/2020   COVID-19 virus infection 04/16/2020   HTN (hypertension) 04/17/2018   Uncontrolled type 2 diabetes mellitus with hyperglycemia, without long-term current use of insulin ([place]) 04/17/2018   Lymphedema 04/17/2018   Anemia 04/07/2018   Bilateral leg pain 03/29/2018   Chest pain 03/20/2018   Unsheltered homelessness 12/16/2017    Orientation RESPIRATION BLADDER Height & Weight      Self, Time, Situation, Place  Normal Continent Weight: 113 kg Height:  '5\' 6"'$  (167.6 cm)  BEHAVIORAL SYMPTOMS/MOOD NEUROLOGICAL BOWEL NUTRITION STATUS      Continent Diet (Regular)  AMBULATORY STATUS COMMUNICATION OF NEEDS Skin   Independent Verbally Normal                       Personal Care Assistance Level of Assistance  Bathing, Feeding, Dressing Bathing Assistance: Independent Feeding assistance: Independent Dressing Assistance: Independent     Functional Limitations Info  Sight, Hearing, Speech Sight Info: Adequate Hearing Info: Adequate Speech Info: Adequate    SPECIAL CARE FACTORS FREQUENCY                       Contractures Contractures Info: Not present    Additional Factors Info  Code Status, Allergies Code Status Info: Full Allergies Info: Bee Venom, Codeine, Novocain, Sulfa           Current Medications (08/14/2022):  This is the current hospital active medication list Current Facility-Administered Medications  Medication Dose Route Frequency Provider Last Rate Last Admin   albuterol (VENTOLIN HFA) 108 (90 Base) MCG/ACT inhaler 2 puff  2 puff Inhalation Q6H PRN Ward, Kristen N, DO       aspirin EC tablet 81 mg  81 mg Oral Daily Ward, Kristen N, DO   81 mg at 08/14/22 0952   atorvastatin (LIPITOR) tablet 40 mg  40 mg Oral Daily Ward, Delice Bison, DO  40 mg at 08/14/22 0952   insulin aspart (novoLOG) injection 0-15 Units  0-15 Units Subcutaneous TID WC Ward, Delice Bison, DO   2 Units at 08/13/22 1610   lisinopril (ZESTRIL) tablet 40 mg  40 mg Oral Daily Ward, Kristen N, DO   40 mg at 08/14/22 0951   [START ON 08/15/2022] metFORMIN (GLUCOPHAGE-XR) 24 hr tablet 500 mg  500 mg Oral Daily Ward, Kristen N, DO       PARoxetine (PAXIL) tablet 10 mg  10 mg Oral Daily Ward, Kristen N, DO   10 mg at 08/14/22 3825   tamsulosin (FLOMAX) capsule 0.4 mg  0.4 mg Oral Daily Ward, Kristen N, DO   0.4 mg at 08/14/22 0539   Current Outpatient Medications   Medication Sig Dispense Refill   albuterol (PROVENTIL) (2.5 MG/3ML) 0.083% nebulizer solution Take 3 mLs by nebulization every 4 (four) hours as needed for wheezing or shortness of breath. 75 mL 0   ascorbic acid (VITAMIN C) 500 MG tablet Take 1 tablet (500 mg total) by mouth daily.     aspirin EC 81 MG tablet Take 1 tablet (81 mg total) by mouth daily. Swallow whole. 30 tablet 5   atorvastatin (LIPITOR) 40 MG tablet Take 1 tablet (40 mg total) by mouth daily. 30 tablet 5   buprenorphine-naloxone (SUBOXONE) 2-0.5 mg SUBL SL tablet Place 1 tablet under the tongue in the morning and at bedtime. [name]     buprenorphine-naloxone (SUBOXONE) 8-2 mg SUBL SL tablet Place 1 tablet under the tongue in the morning and at bedtime.     cyclobenzaprine (FLEXERIL) 5 MG tablet Take 5 mg by mouth at bedtime as needed.     EPINEPHrine 0.3 mg/0.3 mL IJ SOAJ injection Inject 0.3 mg into the muscle as needed.     furosemide (LASIX) 20 MG tablet Take 20 mg by mouth every morning.     furosemide (LASIX) 80 MG tablet Take 1 tablet (80 mg total) by mouth daily. (Patient taking differently: Take 80 mg by mouth as needed.) 30 tablet 5   glucosamine-chondroitin 500-400 MG tablet Take 1 tablet by mouth 3 (three) times daily.     hydrOXYzine (ATARAX) 10 MG tablet Take 10 mg by mouth 3 (three) times daily as needed.     JARDIANCE 10 MG TABS tablet Take 1 tablet (10 mg total) by mouth daily. 30 tablet 5   lansoprazole (PREVACID) 30 MG capsule Take 30 mg by mouth daily.     lisinopril (ZESTRIL) 40 MG tablet Take 1 tablet (40 mg total) by mouth daily. 30 tablet 5   metFORMIN (GLUCOPHAGE-XR) 500 MG 24 hr tablet Take 1 tablet (500 mg total) by mouth daily. 30 tablet 5   Multiple Vitamin (MULTIVITAMIN WITH MINERALS) TABS tablet Take 1 tablet by mouth daily.     ondansetron (ZOFRAN-ODT) 4 MG disintegrating tablet Take 1 tablet (4 mg total) by mouth every 8 (eight) hours as needed. 20 tablet 0   albuterol (VENTOLIN HFA) 108  (90 Base) MCG/ACT inhaler Inhale 2 puffs into the lungs every 6 (six) hours as needed for wheezing or shortness of breath. 1 each 5   CAPSFENAC PAK 1.5 & 0.025 % THPK Apply 5 g topically 2 (two) times daily as needed. (Patient not taking: Reported on 08/13/2022)     fluticasone (FLONASE) 50 MCG/ACT nasal spray Place 1 spray into both nostrils daily. (Patient not taking: Reported on 08/13/2022)     nitroGLYCERIN (NITROSTAT) 0.4 MG SL tablet Place 1 tablet (  0.4 mg total) under the tongue every 5 (five) minutes x 3 doses as needed for chest pain. 30 tablet 5   ondansetron (ZOFRAN-ODT) 4 MG disintegrating tablet Take 1 tablet (4 mg total) by mouth every 6 (six) hours as needed for nausea or vomiting. (Patient not taking: Reported on 08/13/2022) 20 tablet 0   oxyCODONE-acetaminophen (PERCOCET) 5-325 MG tablet Take 1 tablet by mouth every 4 (four) hours as needed for severe pain. (Patient not taking: Reported on 08/13/2022) 20 tablet 0   PARoxetine (PAXIL) 10 MG tablet Take 1 tablet (10 mg total) by mouth daily. (Patient not taking: Reported on 08/13/2022) 30 tablet 5   tamsulosin (FLOMAX) 0.4 MG CAPS capsule Take 0.4 mg by mouth daily. (Patient not taking: Reported on 08/13/2022)       Discharge Medications: Please see discharge summary for a list of discharge medications.  Relevant Imaging Results:  Relevant Lab Results:   Additional Information S# [id number]  Shelbie Hutching, RN

## 2022-08-14 NOTE — ED Notes (Signed)
Pt calls out to this nurse when he gets back to the stretcher and reports he feels like he is having a panic attack. Pt states he was in the [employer] and has PTSD and is having a panic attack and is going to need something to help him sleep. MD aware.

## 2022-08-14 NOTE — ED Notes (Signed)
Pt ate approx 50% of dinner tray. Waste discarded appropriately.

## 2022-08-14 NOTE — ED Notes (Signed)
Pt ambulatory to restroom with a steady gait. Pt appears alert and calm at this time with no distress noted.

## 2022-08-15 DIAGNOSIS — S0990XA Unspecified injury of head, initial encounter: Secondary | ICD-10-CM | POA: Diagnosis not present

## 2022-08-15 LAB — CBG MONITORING, ED
Glucose-Capillary: 82 mg/dL (ref 70–99)
Glucose-Capillary: 116 mg/dL — ABNORMAL HIGH (ref 70–99)
Glucose-Capillary: 102 mg/dL — ABNORMAL HIGH (ref 70–99)

## 2022-08-15 MED ORDER — GUAIFENESIN ER 600 MG PO TB12
600.0000 mg | ORAL_TABLET | Freq: Two times a day (BID) | ORAL | Status: DC
  Administered 2022-08-15 – 2022-08-28 (×28): 600 mg via ORAL
  Filled 2022-08-15 (×28): qty 1

## 2022-08-15 MED ORDER — ACETAMINOPHEN 325 MG PO TABS
650.0000 mg | ORAL_TABLET | Freq: Four times a day (QID) | ORAL | Status: DC | PRN
  Administered 2022-08-15 – 2022-08-19 (×6): 650 mg via ORAL
  Filled 2022-08-15 (×6): qty 2

## 2022-08-15 NOTE — ED Notes (Signed)
Pt awake, ambulating to the restroom

## 2022-08-15 NOTE — ED Notes (Signed)
Patient was given breakfast meal tray. Patient was alert and orient.

## 2022-08-15 NOTE — ED Provider Notes (Signed)
-----------------------------------------   9:16 AM on 08/15/2022 -----------------------------------------   Blood pressure (!) 141/53, pulse (!) 54, temperature 98.6 F (37 C), temperature source Oral, resp. rate 19, height '5\' 6"'$  (1.676 m), weight 113 kg, SpO2 96 %.  The patient is calm and cooperative at this time.  There have been no acute events since the last update.  Awaiting disposition plan from case management/social work.  Ordered Tylenol and Mucinex    Nathaniel Man, MD 08/15/22 [phone]

## 2022-08-15 NOTE — ED Notes (Signed)
Pt given dinner tray.

## 2022-08-15 NOTE — ED Notes (Signed)
Report to [place], [place]

## 2022-08-15 NOTE — ED Notes (Signed)
Patient was very cooperative while obtaining vital signs.

## 2022-08-15 NOTE — ED Notes (Signed)
Pt sleeping. 

## 2022-08-16 DIAGNOSIS — S0990XA Unspecified injury of head, initial encounter: Secondary | ICD-10-CM | POA: Diagnosis not present

## 2022-08-16 LAB — RESP PANEL BY RT-PCR (RSV, FLU A&B, COVID)  RVPGX2
Influenza A by PCR: NEGATIVE
Influenza B by PCR: NEGATIVE
Resp Syncytial Virus by PCR: NEGATIVE
SARS Coronavirus 2 by RT PCR: NEGATIVE

## 2022-08-16 LAB — CBG MONITORING, ED
Glucose-Capillary: 100 mg/dL — ABNORMAL HIGH (ref 70–99)
Glucose-Capillary: 104 mg/dL — ABNORMAL HIGH (ref 70–99)
Glucose-Capillary: 78 mg/dL (ref 70–99)

## 2022-08-16 MED ORDER — ACETAMINOPHEN 500 MG PO TABS
1000.0000 mg | ORAL_TABLET | Freq: Once | ORAL | Status: AC
  Administered 2022-08-16: 1000 mg via ORAL
  Filled 2022-08-16: qty 2

## 2022-08-16 NOTE — ED Notes (Signed)
Hospital meal provided, pt tolerated w/o complaints.  Waste discarded appropriately.  

## 2022-08-16 NOTE — ED Notes (Signed)
Pts CBG obained-100. Pt given lunch tray and drink at this time.

## 2022-08-16 NOTE — ED Notes (Signed)
Patient went to restroom unassisted.

## 2022-08-17 ENCOUNTER — Other Ambulatory Visit: Payer: Self-pay

## 2022-08-17 DIAGNOSIS — S0990XA Unspecified injury of head, initial encounter: Secondary | ICD-10-CM | POA: Diagnosis not present

## 2022-08-17 LAB — CBG MONITORING, ED
Glucose-Capillary: 73 mg/dL (ref 70–99)
Glucose-Capillary: 91 mg/dL (ref 70–99)
Glucose-Capillary: 138 mg/dL — ABNORMAL HIGH (ref 70–99)
Glucose-Capillary: 92 mg/dL (ref 70–99)
Glucose-Capillary: 93 mg/dL (ref 70–99)

## 2022-08-17 MED ORDER — IBUPROFEN 600 MG PO TABS
600.0000 mg | ORAL_TABLET | Freq: Once | ORAL | Status: AC
  Administered 2022-08-17: 600 mg via ORAL
  Filled 2022-08-17: qty 1

## 2022-08-17 MED ORDER — LIDOCAINE 5 % EX PTCH
1.0000 | MEDICATED_PATCH | CUTANEOUS | Status: DC
  Administered 2022-08-17 – 2022-08-28 (×11): 1 via TRANSDERMAL
  Filled 2022-08-17 (×11): qty 1

## 2022-08-17 MED ORDER — HYDROXYZINE HCL 25 MG PO TABS
25.0000 mg | ORAL_TABLET | Freq: Once | ORAL | Status: AC
  Administered 2022-08-17: 25 mg via ORAL
  Filled 2022-08-17: qty 1

## 2022-08-17 NOTE — ED Notes (Signed)
Pt visualized resting in bed, NAD noted with eye closed. Ibuprofen given per patient request as ordered by EDP due to patient requesting Ibuprofen instead of Tylenol.

## 2022-08-17 NOTE — ED Notes (Signed)
Pt given dinner tray and drink at this time.

## 2022-08-17 NOTE — ED Notes (Signed)
NAD noted at this time. Pt resting in bed playing on personal cell phone. Pt c/o continued pain to chest muscles where lidocaine patch has been previously applied.

## 2022-08-17 NOTE — ED Notes (Signed)
Pt still complaining of pain in his chest. MD made aware. EKG ordered and lidocaine patch.
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
## 2022-08-17 NOTE — ED Notes (Signed)
Pt given breakfast tray and drink after obtaining CBG- 73.

## 2022-08-17 NOTE — ED Notes (Signed)
Pt given nighttime snack. 

## 2022-08-18 DIAGNOSIS — S0990XA Unspecified injury of head, initial encounter: Secondary | ICD-10-CM | POA: Diagnosis not present

## 2022-08-18 LAB — CBG MONITORING, ED
Glucose-Capillary: 146 mg/dL — ABNORMAL HIGH (ref 70–99)
Glucose-Capillary: 77 mg/dL (ref 70–99)
Glucose-Capillary: 185 mg/dL — ABNORMAL HIGH (ref 70–99)
Glucose-Capillary: 112 mg/dL — ABNORMAL HIGH (ref 70–99)

## 2022-08-18 MED ORDER — EMPAGLIFLOZIN 10 MG PO TABS
10.0000 mg | ORAL_TABLET | Freq: Every day | ORAL | Status: DC
  Administered 2022-08-19 – 2022-08-28 (×10): 10 mg via ORAL
  Filled 2022-08-18 (×13): qty 1

## 2022-08-18 NOTE — ED Notes (Signed)
Pt visualized in NAD at this time. Pt resting in bed with eyes closed. Respirations even and unlabored.

## 2022-08-18 NOTE — ED Provider Notes (Signed)
-----------------------------------------   7:58 AM on 08/18/2022 -----------------------------------------   Blood pressure (!) 142/54, pulse (!) 52, temperature 98.1 F (36.7 C), temperature source Oral, resp. rate 17, height 1.676 m ('5\' 6"'$ ), weight 113 kg, SpO2 99 %.  The patient is calm and cooperative at this time.  There have been no acute events since the last update.  Awaiting disposition plan from [facility] team.   Hinda Kehr, MD 08/18/22 [phone]

## 2022-08-18 NOTE — ED Notes (Signed)
Pt received breakfast tray 

## 2022-08-19 DIAGNOSIS — S0990XA Unspecified injury of head, initial encounter: Secondary | ICD-10-CM | POA: Diagnosis not present

## 2022-08-19 LAB — CBG MONITORING, ED
Glucose-Capillary: 78 mg/dL (ref 70–99)
Glucose-Capillary: 99 mg/dL (ref 70–99)
Glucose-Capillary: 159 mg/dL — ABNORMAL HIGH (ref 70–99)
Glucose-Capillary: 62 mg/dL — ABNORMAL LOW (ref 70–99)
Glucose-Capillary: 108 mg/dL — ABNORMAL HIGH (ref 70–99)

## 2022-08-19 NOTE — ED Provider Notes (Signed)
-----------------------------------------   9:23 AM on 08/19/2022 -----------------------------------------   Blood pressure 131/71, pulse (!) 54, temperature 98.5 F (36.9 C), resp. rate 18, height 1.676 m ('5\' 6"'$ ), weight 113 kg, SpO2 97 %.  The patient is calm and cooperative at this time.  There have been no acute events since the last update.  Awaiting disposition plan from [facility] team.   Hinda Kehr, MD 08/19/22 [phone]

## 2022-08-19 NOTE — ED Notes (Signed)
Glucose checked 108

## 2022-08-19 NOTE — ED Notes (Signed)
Pt given pm snack and drink. Glucose checked 62

## 2022-08-19 NOTE — ED Notes (Signed)
Patient ambulatory to restroom. Breakfast tray delivered.

## 2022-08-20 DIAGNOSIS — S0990XA Unspecified injury of head, initial encounter: Secondary | ICD-10-CM | POA: Diagnosis not present

## 2022-08-20 LAB — CBG MONITORING, ED
Glucose-Capillary: 72 mg/dL (ref 70–99)
Glucose-Capillary: 113 mg/dL — ABNORMAL HIGH (ref 70–99)
Glucose-Capillary: 88 mg/dL (ref 70–99)
Glucose-Capillary: 177 mg/dL — ABNORMAL HIGH (ref 70–99)

## 2022-08-20 MED ORDER — METHOCARBAMOL 500 MG PO TABS
500.0000 mg | ORAL_TABLET | Freq: Three times a day (TID) | ORAL | Status: DC | PRN
  Administered 2022-08-20 – 2022-08-25 (×4): 500 mg via ORAL
  Filled 2022-08-20 (×7): qty 1

## 2022-08-20 MED ORDER — ACETAMINOPHEN 500 MG PO TABS
1000.0000 mg | ORAL_TABLET | Freq: Four times a day (QID) | ORAL | Status: DC | PRN
  Administered 2022-08-20 – 2022-08-25 (×5): 1000 mg via ORAL
  Filled 2022-08-20 (×5): qty 2

## 2022-08-20 MED ORDER — MELATONIN 5 MG PO TABS
5.0000 mg | ORAL_TABLET | Freq: Every day | ORAL | Status: DC
  Administered 2022-08-21 – 2022-08-28 (×8): 5 mg via ORAL
  Filled 2022-08-20 (×8): qty 1

## 2022-08-20 MED ORDER — LORAZEPAM 2 MG PO TABS
2.0000 mg | ORAL_TABLET | Freq: Once | ORAL | Status: AC
  Administered 2022-08-20: 2 mg via ORAL
  Filled 2022-08-20: qty 1

## 2022-08-20 MED ORDER — LIDOCAINE 5 % EX PTCH: 1.0000 | MEDICATED_PATCH | CUTANEOUS | Status: DC

## 2022-08-20 NOTE — ED Provider Notes (Signed)
Emergency Medicine Observation Re-evaluation Note  Physical Exam   BP (!) 153/72   Pulse (!) 50   Temp 98.5 F (36.9 C)   Resp 17   Ht '5\' 6"'$  (1.676 m)   Wt 113 kg   SpO2 96%   BMI 40.21 kg/m   Pt is calm and resting, respirations unlabored, and appears comfortable.   ED Course / MDM   No reported events during my shift at the time of this note.   Pt is awaiting dispo from SW   Lucillie Garfinkel MD    Lucillie Garfinkel, MD 08/20/22 [phone]

## 2022-08-20 NOTE — ED Notes (Signed)
Pt up to ambulate to the bathroom

## 2022-08-20 NOTE — ED Notes (Signed)
Per Dr. Jacelyn Grip, CBG ok to wait til morning to recheck (no need for insulin right this moment).

## 2022-08-20 NOTE — ED Notes (Signed)
Report given to [facility], RN

## 2022-08-21 DIAGNOSIS — S0990XA Unspecified injury of head, initial encounter: Secondary | ICD-10-CM | POA: Diagnosis not present

## 2022-08-21 LAB — CBG MONITORING, ED
Glucose-Capillary: 88 mg/dL (ref 70–99)
Glucose-Capillary: 128 mg/dL — ABNORMAL HIGH (ref 70–99)
Glucose-Capillary: 86 mg/dL (ref 70–99)
Glucose-Capillary: 91 mg/dL (ref 70–99)

## 2022-08-21 MED ORDER — FLUTICASONE PROPIONATE 50 MCG/ACT NA SUSP
1.0000 | Freq: Every day | NASAL | Status: DC
  Administered 2022-08-22 – 2022-08-23 (×2): 1 via NASAL
  Filled 2022-08-21: qty 16

## 2022-08-21 MED ORDER — HYDROXYZINE HCL 25 MG PO TABS
25.0000 mg | ORAL_TABLET | Freq: Once | ORAL | Status: AC
  Administered 2022-08-21: 25 mg via ORAL
  Filled 2022-08-21: qty 1

## 2022-08-21 MED ORDER — LORAZEPAM 1 MG PO TABS
1.0000 mg | ORAL_TABLET | Freq: Once | ORAL | Status: AC
  Administered 2022-08-21: 1 mg via ORAL
  Filled 2022-08-21: qty 1

## 2022-08-21 NOTE — ED Provider Notes (Signed)
-----------------------------------------   5:50 AM on 08/21/2022 -----------------------------------------   Blood pressure (!) 132/59, pulse (!) 56, temperature 98.9 F (37.2 C), temperature source Oral, resp. rate 16, height '5\' 6"'$  (1.676 m), weight 113 kg, SpO2 97 %.  The patient is calm and cooperative at this time.  There have been no acute events since the last update.  Awaiting disposition plan from Social Work team.   Paulette Blanch, MD 08/21/22 [phone]

## 2022-08-21 NOTE — ED Notes (Signed)
Dietary called to send dinner tray

## 2022-08-21 NOTE — ED Notes (Addendum)
Pt ambulatory to bathroom

## 2022-08-21 NOTE — ED Notes (Signed)
Pt given breakfast tray

## 2022-08-21 NOTE — TOC Progression Note (Signed)
Transition of Care [facility]) - Progression Note    Patient Details  Name: [name] MRN: [id number] Date of Birth: [birth year]  Transition of Care [name]) CM/SW [place], LCSW Phone Number: 08/21/2022, 2:04 PM  Clinical Narrative:   [facility] coworker left voicemail for Dyanne Iha in [place] on 12/19 to see if he would come assess patient for potential placement. This CSW left him another voicemail.  Expected Discharge Plan: Home/Self Care Barriers to Discharge: Continued Medical Work up  Expected Discharge Plan and Services   Discharge Planning Services: CM Consult   Living arrangements for the past 2 months: Homeless                 DME Arranged: N/A DME Agency: NA       HH Arranged: NA           Social Determinants of Health (SDOH) Interventions SDOH Screenings   Food Insecurity: Food Insecurity Present (04/27/2022)  Housing: High Risk (04/26/2022)  Transportation Needs: Unmet Transportation Needs (04/27/2022)  Alcohol Screen: Low Risk  (04/30/2018)  Depression (PHQ2-9): High Risk (04/26/2022)  Financial Resource Strain: High Risk (04/27/2022)  Physical Activity: Sufficiently Active (04/16/2018)  Social Connections: Moderately Isolated (04/30/2018)  Stress: Stress Concern Present (04/16/2018)  Tobacco Use: Medium Risk (08/12/2022)    Readmission Risk Interventions     No data to display

## 2022-08-22 ENCOUNTER — Ambulatory Visit: Payer: Medicare Other | Admitting: Family

## 2022-08-22 DIAGNOSIS — S0990XA Unspecified injury of head, initial encounter: Secondary | ICD-10-CM | POA: Diagnosis not present

## 2022-08-22 LAB — CBG MONITORING, ED
Glucose-Capillary: 78 mg/dL (ref 70–99)
Glucose-Capillary: 84 mg/dL (ref 70–99)
Glucose-Capillary: 101 mg/dL — ABNORMAL HIGH (ref 70–99)
Glucose-Capillary: 81 mg/dL (ref 70–99)

## 2022-08-22 NOTE — ED Notes (Signed)
Lunch meal tray given at this time.  

## 2022-08-22 NOTE — ED Provider Notes (Signed)
-----------------------------------------   5:45 AM on 08/22/2022 -----------------------------------------   Blood pressure (!) 159/81, pulse (!) 52, temperature 98.6 F (37 C), temperature source Oral, resp. rate 17, height '5\' 6"'$  (1.676 m), weight 113 kg, SpO2 96 %.  The patient is calm and cooperative at this time.  There have been no acute events since the last update.  Awaiting disposition plan from case management/social work.    Costa Jha, Delice Bison, DO 08/22/22 [phone]

## 2022-08-22 NOTE — ED Notes (Signed)
Pt given a diet soda and sandwich tray

## 2022-08-23 DIAGNOSIS — S0990XA Unspecified injury of head, initial encounter: Secondary | ICD-10-CM | POA: Diagnosis not present

## 2022-08-23 LAB — CBG MONITORING, ED
Glucose-Capillary: 70 mg/dL (ref 70–99)
Glucose-Capillary: 137 mg/dL — ABNORMAL HIGH (ref 70–99)
Glucose-Capillary: 74 mg/dL (ref 70–99)

## 2022-08-23 MED ORDER — LOPERAMIDE HCL 2 MG PO CAPS
4.0000 mg | ORAL_CAPSULE | Freq: Once | ORAL | Status: AC
  Administered 2022-08-23: 4 mg via ORAL
  Filled 2022-08-23: qty 2

## 2022-08-23 NOTE — ED Notes (Signed)
Pt in bed with eyes closed, resps even and unlabored.  

## 2022-08-23 NOTE — ED Provider Notes (Signed)
-----------------------------------------   6:39 AM on 08/23/2022 -----------------------------------------   Blood pressure (!) 164/75, pulse (!) 54, temperature 98.5 F (36.9 C), temperature source Oral, resp. rate 16, height '5\' 6"'$  (1.676 m), weight 113 kg, SpO2 95 %.  The patient is calm and cooperative at this time.  There have been no acute events since the last update.  Awaiting disposition plan from Social Work team.   Paulette Blanch, MD 08/23/22 [phone]

## 2022-08-23 NOTE — ED Notes (Signed)
Pt provided a sandwich tray and water.

## 2022-08-23 NOTE — ED Notes (Signed)
Pt in bed with eyes closed, arouses easily, pt ambulatory to bathroom

## 2022-08-23 NOTE — ED Notes (Signed)
Pt ambulatory to and from bathroom with steady gait

## 2022-08-23 NOTE — ED Notes (Signed)
Pt in bed, pt states that he is having some diarrhea, pt requests some medication for diarrhea, md notified, meds given.

## 2022-08-24 DIAGNOSIS — S0990XA Unspecified injury of head, initial encounter: Secondary | ICD-10-CM | POA: Diagnosis not present

## 2022-08-24 LAB — CBG MONITORING, ED
Glucose-Capillary: 81 mg/dL (ref 70–99)
Glucose-Capillary: 130 mg/dL — ABNORMAL HIGH (ref 70–99)
Glucose-Capillary: 97 mg/dL (ref 70–99)
Glucose-Capillary: 110 mg/dL — ABNORMAL HIGH (ref 70–99)

## 2022-08-24 MED ORDER — LOPERAMIDE HCL 2 MG PO CAPS
2.0000 mg | ORAL_CAPSULE | Freq: Once | ORAL | Status: AC
  Administered 2022-08-24: 2 mg via ORAL
  Filled 2022-08-24: qty 1

## 2022-08-24 NOTE — ED Notes (Signed)
Pt showered and safely walked back to bed.

## 2022-08-24 NOTE — ED Notes (Signed)
Pt given lunch tray and ice water at this time.

## 2022-08-24 NOTE — TOC Progression Note (Signed)
Transition of Care [facility]) - Progression Note    Patient Details  Name: [name] MRN: [id number] Date of Birth: 01/02/1951  Transition [facility]) CM/SW Contact  Shelbie Hutching, RN Phone Number: 08/24/2022, 5:26 PM  Clinical Narrative:    Met with patient at the bedside today to discuss discharge planning.  RNCM has been unable to find placement for patient.  Discussed this with patient and he verbalizes understanding.  He reports that right now he does not have anywhere to go, he reports that on 08/28/22 he will be able to stay with a family member that will be coming down from [place] with a camper.  TOC will follow up with patient on Tuesday.     Expected Discharge Plan: Home/Self Care Barriers to Discharge: Continued Medical Work up  Expected Discharge Plan and Services   Discharge Planning Services: CM Consult   Living arrangements for the past 2 months: Homeless                 DME Arranged: N/A DME Agency: NA       HH Arranged: NA           Social Determinants of Health (SDOH) Interventions SDOH Screenings   Food Insecurity: Food Insecurity Present (04/27/2022)  Housing: High Risk (04/26/2022)  Transportation Needs: Unmet Transportation Needs (04/27/2022)  Alcohol Screen: Low Risk  (04/30/2018)  Depression (PHQ2-9): High Risk (04/26/2022)  Financial Resource Strain: High Risk (04/27/2022)  Physical Activity: Sufficiently Active (04/16/2018)  Social Connections: Moderately Isolated (04/30/2018)  Stress: Stress Concern Present (04/16/2018)  Tobacco Use: Medium Risk (08/12/2022)    Readmission Risk Interventions     No data to display

## 2022-08-24 NOTE — ED Provider Notes (Signed)
-----------------------------------------   9:08 AM on 08/24/2022 -----------------------------------------   Blood pressure (!) 155/70, pulse 62, temperature 98.1 F (36.7 C), temperature source Oral, resp. rate 20, height 1.676 m ('5\' 6"'$ ), weight 113 kg, SpO2 99 %.  The patient is calm and cooperative at this time.  There have been no acute events since the last update.  Awaiting disposition plan from [facility] team.   Hinda Kehr, MD 08/24/22 [phone]

## 2022-08-24 NOTE — ED Notes (Signed)
  CBG 103  

## 2022-08-25 DIAGNOSIS — S0990XA Unspecified injury of head, initial encounter: Secondary | ICD-10-CM | POA: Diagnosis not present

## 2022-08-25 LAB — CBG MONITORING, ED
Glucose-Capillary: 78 mg/dL (ref 70–99)
Glucose-Capillary: 109 mg/dL — ABNORMAL HIGH (ref 70–99)
Glucose-Capillary: 101 mg/dL — ABNORMAL HIGH (ref 70–99)

## 2022-08-25 NOTE — ED Provider Notes (Signed)
-----------------------------------------   8:42 AM on 08/25/2022 -----------------------------------------   Blood pressure (!) 145/70, pulse 68, temperature 98 F (36.7 C), temperature source Oral, resp. rate 16, height 1.676 m ('5\' 6"'$ ), weight 113 kg, SpO2 100 %.  The patient is calm and cooperative at this time.  There have been no acute events since the last update.  Awaiting disposition plan from [facility] team.   Hinda Kehr, MD 08/25/22 [phone]

## 2022-08-26 DIAGNOSIS — S0990XA Unspecified injury of head, initial encounter: Secondary | ICD-10-CM | POA: Diagnosis not present

## 2022-08-26 LAB — CBG MONITORING, ED: Glucose-Capillary: 130 mg/dL — ABNORMAL HIGH (ref 70–99)

## 2022-08-26 NOTE — ED Notes (Signed)
Pt ambulatory throughout unit. No needs.

## 2022-08-27 DIAGNOSIS — S0990XA Unspecified injury of head, initial encounter: Secondary | ICD-10-CM | POA: Diagnosis not present

## 2022-08-27 LAB — CBG MONITORING, ED
Glucose-Capillary: 96 mg/dL (ref 70–99)
Glucose-Capillary: 106 mg/dL — ABNORMAL HIGH (ref 70–99)
Glucose-Capillary: 102 mg/dL — ABNORMAL HIGH (ref 70–99)
Glucose-Capillary: 120 mg/dL — ABNORMAL HIGH (ref 70–99)

## 2022-08-27 NOTE — ED Notes (Signed)
Pt provided with food box per request

## 2022-08-27 NOTE — ED Notes (Signed)
Pt noted to be ambulatory to restroom with steady gait. No needs voiced

## 2022-08-27 NOTE — ED Notes (Signed)
Pt eating breakfast 

## 2022-08-27 NOTE — ED Provider Notes (Signed)
Emergency Medicine Observation Re-evaluation Note  [name] is a 72 y.o. male, seen on rounds today.   Physical Exam  BP (!) 120/57   Pulse (!) 53   Temp 98 F (36.7 C) (Oral)   Resp 17   Ht '5\' 6"'$  (1.676 m)   Wt 113 kg   SpO2 98%   BMI 40.21 kg/m  Physical Exam General: Patient resting comfortably in bed Lungs: Patient not in respiratory distress Psych: Patient not combative  ED Course / MDM  EKG  Plan  Current plan is for evaluation by TOC and placement    Nena Polio, MD 08/27/22 [phone]

## 2022-08-27 NOTE — ED Notes (Signed)
Pt noted to be ambulatory to restroom with steady gait. No additional needs or c/o voiced at this time

## 2022-08-28 DIAGNOSIS — S0990XA Unspecified injury of head, initial encounter: Secondary | ICD-10-CM | POA: Diagnosis not present

## 2022-08-28 LAB — CBG MONITORING, ED
Glucose-Capillary: 94 mg/dL (ref 70–99)
Glucose-Capillary: 108 mg/dL — ABNORMAL HIGH (ref 70–99)
Glucose-Capillary: 120 mg/dL — ABNORMAL HIGH (ref 70–99)

## 2022-08-28 MED ORDER — HYDROXYZINE HCL 25 MG PO TABS
50.0000 mg | ORAL_TABLET | Freq: Once | ORAL | Status: AC
  Administered 2022-08-28: 50 mg via ORAL
  Filled 2022-08-28: qty 2
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
## 2022-08-28 NOTE — ED Provider Notes (Signed)
-----------------------------------------   5:39 AM on 08/28/2022 -----------------------------------------   Blood pressure (!) 160/73, pulse (!) 52, temperature 98.3 F (36.8 C), resp. rate 18, height '5\' 6"'$  (1.676 m), weight 113 kg, SpO2 99 %.  The patient is calm and cooperative at this time.  There have been no acute events since the last update.  Awaiting disposition plan from Social Work team.   Paulette Blanch, MD 08/28/22 [phone]

## 2022-08-29 NOTE — ED Provider Notes (Signed)
-----------------------------------------   5:10 AM on 08/29/2022 -----------------------------------------   Blood pressure 138/74, pulse 60, temperature 98.1 F (36.7 C), temperature source Oral, resp. rate 17, height '5\' 6"'$  (1.676 m), weight 113 kg, SpO2 99 %.  The patient is calm and cooperative at this time.  There have been no acute events since the last update.  Awaiting disposition plan from Social Work team - anticipate discharge to home this morning.   Paulette Blanch, MD 08/29/22 [phone]

## 2022-08-29 NOTE — Discharge Instructions (Signed)
Take your medicines as directed by your doctor.  Return to the ER for worsening symptoms, persistent vomiting, difficulty breathing or other concerns.

## 2022-08-30 ENCOUNTER — Emergency Department: Payer: Medicare Other

## 2022-08-30 ENCOUNTER — Other Ambulatory Visit: Payer: Self-pay

## 2022-08-30 ENCOUNTER — Observation Stay
Admission: EM | Admit: 2022-08-30 | Discharge: 2022-09-01 | Disposition: A | Discharge status: Home / Self Care | Payer: Medicare Other | Attending: Student | Admitting: Student

## 2022-08-30 DIAGNOSIS — Z8616 Personal history of COVID-19: Secondary | ICD-10-CM | POA: Diagnosis not present

## 2022-08-30 DIAGNOSIS — Z1152 Encounter for screening for COVID-19: Secondary | ICD-10-CM | POA: Diagnosis not present

## 2022-08-30 DIAGNOSIS — R945 Abnormal results of liver function studies: Secondary | ICD-10-CM | POA: Insufficient documentation

## 2022-08-30 DIAGNOSIS — I110 Hypertensive heart disease with heart failure: Secondary | ICD-10-CM | POA: Insufficient documentation

## 2022-08-30 DIAGNOSIS — I10 Essential (primary) hypertension: Secondary | ICD-10-CM | POA: Diagnosis present

## 2022-08-30 DIAGNOSIS — I959 Hypotension, unspecified: Secondary | ICD-10-CM | POA: Diagnosis not present

## 2022-08-30 DIAGNOSIS — R001 Bradycardia, unspecified: Secondary | ICD-10-CM | POA: Diagnosis not present

## 2022-08-30 DIAGNOSIS — I509 Heart failure, unspecified: Secondary | ICD-10-CM | POA: Diagnosis not present

## 2022-08-30 DIAGNOSIS — Z7982 Long term (current) use of aspirin: Secondary | ICD-10-CM | POA: Diagnosis not present

## 2022-08-30 DIAGNOSIS — Z79899 Other long term (current) drug therapy: Secondary | ICD-10-CM | POA: Diagnosis not present

## 2022-08-30 DIAGNOSIS — Z87891 Personal history of nicotine dependence: Secondary | ICD-10-CM | POA: Insufficient documentation

## 2022-08-30 DIAGNOSIS — E43 Unspecified severe protein-calorie malnutrition: Secondary | ICD-10-CM | POA: Diagnosis present

## 2022-08-30 DIAGNOSIS — D649 Anemia, unspecified: Secondary | ICD-10-CM | POA: Diagnosis not present

## 2022-08-30 DIAGNOSIS — R7989 Other specified abnormal findings of blood chemistry: Secondary | ICD-10-CM | POA: Diagnosis present

## 2022-08-30 DIAGNOSIS — J449 Chronic obstructive pulmonary disease, unspecified: Secondary | ICD-10-CM | POA: Diagnosis not present

## 2022-08-30 DIAGNOSIS — N390 Urinary tract infection, site not specified: Secondary | ICD-10-CM | POA: Insufficient documentation

## 2022-08-30 DIAGNOSIS — D696 Thrombocytopenia, unspecified: Secondary | ICD-10-CM | POA: Diagnosis present

## 2022-08-30 DIAGNOSIS — E1165 Type 2 diabetes mellitus with hyperglycemia: Secondary | ICD-10-CM | POA: Diagnosis present

## 2022-08-30 DIAGNOSIS — R531 Weakness: Secondary | ICD-10-CM

## 2022-08-30 LAB — CBC
HCT: 35.2 % — ABNORMAL LOW (ref 39.0–52.0)
Hemoglobin: 11.5 g/dL — ABNORMAL LOW (ref 13.0–17.0)
MCH: 30.8 pg (ref 26.0–34.0)
MCHC: 32.7 g/dL (ref 30.0–36.0)
MCV: 94.4 fL (ref 80.0–100.0)
Platelets: 93 10*3/uL — ABNORMAL LOW (ref 150–400)
RBC: 3.73 MIL/uL — ABNORMAL LOW (ref 4.22–5.81)
RDW: 17.5 % — ABNORMAL HIGH (ref 11.5–15.5)
WBC: 5.2 10*3/uL (ref 4.0–10.5)
nRBC: 0 % (ref 0.0–0.2)

## 2022-08-30 LAB — BASIC METABOLIC PANEL
Anion gap: 8 (ref 5–15)
BUN: 19 mg/dL (ref 8–23)
CO2: 23 mmol/L (ref 22–32)
Calcium: 8.9 mg/dL (ref 8.9–10.3)
Chloride: 107 mmol/L (ref 98–111)
Creatinine, Ser: 1.21 mg/dL (ref 0.61–1.24)
GFR, Estimated: >60 mL/min (ref >60)
Glucose, Bld: 115 mg/dL — ABNORMAL HIGH (ref 70–99)
Potassium: 3.5 mmol/L (ref 3.5–5.1)
Sodium: 138 mmol/L (ref 135–145)

## 2022-08-30 LAB — HEPATIC FUNCTION PANEL
ALT: 29 U/L (ref 0–44)
AST: 46 U/L — ABNORMAL HIGH (ref 15–41)
Albumin: 2.6 g/dL — ABNORMAL LOW (ref 3.5–5.0)
Alkaline Phosphatase: 89 U/L (ref 38–126)
Bilirubin, Direct: 0.5 mg/dL — ABNORMAL HIGH (ref 0.0–0.2)
Indirect Bilirubin: 1.2 mg/dL — ABNORMAL HIGH (ref 0.3–0.9)
Total Bilirubin: 1.7 mg/dL — ABNORMAL HIGH (ref 0.3–1.2)
Total Protein: 6.2 g/dL — ABNORMAL LOW (ref 6.5–8.1)

## 2022-08-30 LAB — LACTIC ACID, PLASMA: Lactic Acid, Venous: 1.3 mmol/L (ref 0.5–1.9)

## 2022-08-30 LAB — TROPONIN I (HIGH SENSITIVITY): Troponin I (High Sensitivity): 14 ng/L (ref <18)

## 2022-08-30 LAB — CBG MONITORING, ED: Glucose-Capillary: 174 mg/dL — ABNORMAL HIGH (ref 70–99)

## 2022-08-30 LAB — RESP PANEL BY RT-PCR (RSV, FLU A&B, COVID)  RVPGX2
Influenza A by PCR: NEGATIVE
Influenza B by PCR: NEGATIVE
Resp Syncytial Virus by PCR: NEGATIVE
SARS Coronavirus 2 by RT PCR: NEGATIVE

## 2022-08-30 MED ORDER — SODIUM CHLORIDE 0.9 % IV BOLUS
500.0000 mL | Freq: Once | INTRAVENOUS | Status: AC
  Administered 2022-08-30: 500 mL via INTRAVENOUS

## 2022-08-30 MED ORDER — IOHEXOL 350 MG/ML SOLN
100.0000 mL | Freq: Once | INTRAVENOUS | Status: AC | PRN
  Administered 2022-08-30: 100 mL via INTRAVENOUS

## 2022-08-30 MED ORDER — SODIUM CHLORIDE 0.9 % IV BOLUS
1000.0000 mL | Freq: Once | INTRAVENOUS | Status: AC
  Administered 2022-08-30: 1000 mL via INTRAVENOUS

## 2022-08-30 NOTE — ED Notes (Signed)
254 mL with bladder scanner

## 2022-08-30 NOTE — ED Provider Notes (Addendum)
[facility] Provider Note    Event Date/Time   First MD Initiated Contact with Patient 08/30/22 1827     (approximate)   History   Weakness   HPI  [name] is a 72 y.o. male past medical history of COPD, cirrhosis of the hep C presents because of weakness.  Patient tells me he suddenly became very weak diffusely about 4 hours ago.  Occurred when he was in town going to eat with someone.  Feels weak all over nonfocally.  Feels like his vision is somewhat blurry as well.  Denies chest pain shortness of breath, fevers chills sore throat.  Has chronic diarrhea this is not new.  Denies belly pain or vomiting.  Denies urinary symptoms.  Patient was boarding in the ED for almost 2 weeks was discharged from the ED just yesterday.  He says he went with a friend and is staying with a friend currently.     Past Medical History:  Diagnosis Date   Asthma    CHF (congestive heart failure) ([place])    COPD (chronic obstructive pulmonary disease) ([place])    COVID-19 03/2020   diagnosed in August 2021   Diabetes mellitus without complication ([place])    Homelessness    Hypertension    Infestation by bed bug    Migraine    Obesity    Sleep apnea     Patient Active Problem List   Diagnosis Date Noted   Right sided weakness    TIA (transient ischemic attack) 04/28/2022   Obesity    Chronic diastolic CHF (congestive heart failure) ([place])    Depression    Sepsis ([place]) 03/28/2022   Bacteremia due to Klebsiella pneumoniae 03/28/2022   Hypoglycemia 03/28/2022   Syncope 03/24/2022   Hypokalemia 03/24/2022   Fever 03/24/2022   COPD (chronic obstructive pulmonary disease) (HCC)    Elevated troponin    Abnormal LFTs    History of hepatitis C    AKI (acute kidney injury) ( [place])    Pure hypercholesterolemia    Pneumonia due to COVID-19 virus 04/19/2020   Elevated transaminase level 04/18/2020   Obesity, Class III, BMI 40-49.9 (morbid obesity) ([place]) 04/18/2020   Lactic  acidosis 04/17/2020   Thrombocytopenia ([place]) 04/17/2020   Leukopenia 04/17/2020   COVID-19 virus infection 04/16/2020   HTN (hypertension) 04/17/2018   Uncontrolled type 2 diabetes mellitus with hyperglycemia, without long-term current use of insulin ([place]) 04/17/2018   Lymphedema 04/17/2018   Anemia 04/07/2018   Bilateral leg pain 03/29/2018   Chest pain 03/20/2018   Unsheltered homelessness 12/16/2017     Physical Exam  Triage Vital Signs: ED Triage Vitals  Enc Vitals Group     BP 08/30/22 1704 (!) 86/42     Pulse Rate 08/30/22 1704 62     Resp 08/30/22 1704 18     Temp 08/30/22 1704 97.9 F (36.6 C)     Temp Source 08/30/22 1704 Oral     SpO2 08/30/22 1704 92 %     Weight --      Height --      Head Circumference --      Peak Flow --      Pain Score 08/30/22 1656 0     Pain Loc --      Pain Edu? --      Excl. in [place]? --     Most recent vital signs: Vitals:   08/30/22 2148 08/30/22 2328  BP: 114/68 (!) 97/53  Pulse: Marland Kitchen)  57   Resp: 18 18  Temp: 97.7 F (36.5 C)   SpO2: 100%      General: Awake, no distress. Dry MM CV:  Good peripheral perfusion. No edema Resp:  Normal effort. Lungs are claer Abd:  No distention. Soft nontender Neuro:             Awake, Alert, Oriented x 3  Other:     ED Results / Procedures / Treatments  Labs (all labs ordered are listed, but only abnormal results are displayed) Labs Reviewed  BASIC METABOLIC PANEL - Abnormal; Notable for the following components:      Result Value   Glucose, Bld 115 (*)    All other components within normal limits  CBC - Abnormal; Notable for the following components:   RBC 3.73 (*)    Hemoglobin 11.5 (*)    HCT 35.2 (*)    RDW 17.5 (*)    Platelets 93 (*)    All other components within normal limits  URINALYSIS, ROUTINE W REFLEX MICROSCOPIC - Abnormal; Notable for the following components:   Color, Urine AMBER (*)    APPearance HAZY (*)    Glucose, UA >=500 (*)    Hgb urine dipstick MODERATE  (*)    Protein, ur 30 (*)    Bacteria, UA RARE (*)    All other components within normal limits  HEPATIC FUNCTION PANEL - Abnormal; Notable for the following components:   Total Protein 6.2 (*)    Albumin 2.6 (*)    AST 46 (*)    Total Bilirubin 1.7 (*)    Bilirubin, Direct 0.5 (*)    Indirect Bilirubin 1.2 (*)    All other components within normal limits  CBG MONITORING, ED - Abnormal; Notable for the following components:   Glucose-Capillary 174 (*)    All other components within normal limits  RESP PANEL BY RT-PCR (RSV, FLU A&B, COVID)  [id number]  LACTIC ACID, PLASMA  LACTIC ACID, PLASMA  TROPONIN I (HIGH SENSITIVITY)     EKG  EKG reviewed interpreted myself shows sinus rhythm normal axis normal intervals LVH T wave inversions in lateral leads I and aVL   RADIOLOGY I reviewed and interpreted the CXR which does not show any acute cardiopulmonary process    PROCEDURES:  Critical Care performed: No  Procedures  The patient is on the cardiac monitor to evaluate for evidence of arrhythmia and/or significant heart rate changes.   MEDICATIONS ORDERED IN ED: Medications  sodium chloride 0.9 % bolus 500 mL (has no administration in time range)  sodium chloride 0.9 % bolus 1,000 mL (0 mLs Intravenous Stopped 08/30/22 1933)  sodium chloride 0.9 % bolus 1,000 mL (0 mLs Intravenous Stopped 08/30/22 1954)  sodium chloride 0.9 % bolus 500 mL (0 mLs Intravenous Stopped 08/30/22 2148)  iohexol (OMNIPAQUE) 350 MG/ML injection 100 mL (100 mLs Intravenous Contrast Given 08/30/22 2022)     IMPRESSION / MDM / ASSESSMENT AND PLAN / ED COURSE  I reviewed the triage vital signs and the nursing notes.                              Patient's presentation is most consistent with acute presentation with potential threat to life or bodily function.  Differential diagnosis includes, but is not limited to, dehydration, sepsis, anemia, pulmonary embolism, cardiogenic shock  Patient is a 73 year old  male presents because of generalized weakness.  Patient spent multiple days in  the ED recently because he is homeless and waiting TOC placement.  He left yesterday and is staying with a friend.  Became acutely generally weak around 5:00 today.  He really has no focal symptoms denies cough fevers chills chest pain dyspnea abdominal pain vomiting diarrhea.  Does feel somewhat lightheaded and like his vision is blurred tells me has been eating and drinking.  Denies other new medications.  Patient overall looks well, abdominal exam is benign lungs are clear does have somewhat dry mucous membranes.  No pedal edema.  Patient's blood pressure is low it is 90s over 50s.  Reviewed his recent ED stay and he was not this low in the past.  Plan to get labs chest x-ray EKG and give fluid bolus.  Patient did not really respond to first liter bolus so given another 1.5 L.  I did perform a bedside ultrasound of the heart and abdomen.  EF looks good IVC collapsible so I do think he can tolerate the fluids.  There is no pericardial effusion no dilated RV.  Patient's labs overall reassuring no leukocytosis negative lactate mildly increased from 1.8-1.2.  The bone is near baseline.  Total bili mildly elevated at 1.7.  COVID and flu negative.  Chest x-ray clear.  EKG without obvious ischemic change.  Given no obvious cause for hypotension did obtain CTA of the chest as well as CT abdomen pelvis.  There is really no acute findings here.  There is possible HCC but would not expect this to cause hypotension.  On repeat patient's blood pressure 114/68.  He is still complaining of generalized weakness.  Currently is pending urinalysis.  Told nursing that he felt too dry to give urine sample but he does have urine in bladder on CT.  Will bladder scan.  Anticipate that if patient's blood pressure remains okay he can be discharged.  Patient now again borderline hypotensive with MAP 65.  He remains quite subjectively weak.  Attempted to  ambulate him he felt too unsteady and felt like his legs were not working.  I did examine him again he has 5 out of 5 strength with plantarflexion dorsiflexion and hip flexion while at rest but was not able to ambulate.  He is objectively hypotensive for him so I do think that he should be observed.  I have ordered another 500 cc bolus.  Unclear what the cause of his hypotension is at this point.  Does not appear to be septic.   Clinical Course as of 08/31/22 0019  Thu Aug 30, 2022  2138 SARS Coronavirus 2 by RT PCR: NEGATIVE [KM]    Clinical Course User Index [KM] Rada Hay, MD     FINAL CLINICAL IMPRESSION(S) / ED DIAGNOSES   Final diagnoses:  Weakness  Hypotension, unspecified hypotension type     Rx / DC Orders   ED Discharge Orders     None        Note:  This document was prepared using Dragon voice recognition software and may include unintentional dictation errors.   Rada Hay, MD 08/30/22 2243    Rada Hay, MD 08/31/22 [phone]

## 2022-08-30 NOTE — ED Notes (Signed)
Pt bp in the 80's/40's and o2 dropped to 88% with good pleth. Provider aware and came to bedside. Pt on 2 L [place]. Additional bolus ordered by MD

## 2022-08-30 NOTE — ED Triage Notes (Signed)
Pt comes via EMs from home with c/o weakness. Pt states he just wants to get seen for weakness. EMS reports VSS. Pt denies any pain.  [id number]  Pt [place] the woods in a [place]. Pt did have some roaches on him per EMs.

## 2022-08-31 ENCOUNTER — Encounter: Payer: Self-pay | Admitting: Internal Medicine

## 2022-08-31 ENCOUNTER — Emergency Department: Payer: Medicare Other

## 2022-08-31 ENCOUNTER — Observation Stay: Payer: Medicare Other

## 2022-08-31 DIAGNOSIS — D649 Anemia, unspecified: Secondary | ICD-10-CM

## 2022-08-31 DIAGNOSIS — E43 Unspecified severe protein-calorie malnutrition: Secondary | ICD-10-CM | POA: Diagnosis present

## 2022-08-31 DIAGNOSIS — D696 Thrombocytopenia, unspecified: Secondary | ICD-10-CM | POA: Diagnosis not present

## 2022-08-31 DIAGNOSIS — I10 Essential (primary) hypertension: Secondary | ICD-10-CM | POA: Diagnosis not present

## 2022-08-31 DIAGNOSIS — E861 Hypovolemia: Secondary | ICD-10-CM

## 2022-08-31 DIAGNOSIS — I959 Hypotension, unspecified: Secondary | ICD-10-CM | POA: Diagnosis not present

## 2022-08-31 DIAGNOSIS — I9589 Other hypotension: Secondary | ICD-10-CM | POA: Diagnosis not present

## 2022-08-31 LAB — URINALYSIS, ROUTINE W REFLEX MICROSCOPIC
Bilirubin Urine: NEGATIVE
Glucose, UA: >=500 mg/dL — AB
Ketones, ur: NEGATIVE mg/dL
Leukocytes,Ua: NEGATIVE
Nitrite: NEGATIVE
Protein, ur: 30 mg/dL — AB
Specific Gravity, Urine: 1.029 (ref 1.005–1.030)
pH: 5 (ref 5.0–8.0)

## 2022-08-31 LAB — IRON AND TIBC
Iron: 45 ug/dL (ref 45–182)
Saturation Ratios: 17 % — ABNORMAL LOW (ref 17.9–39.5)
TIBC: 266 ug/dL (ref 250–450)
UIBC: 221 ug/dL

## 2022-08-31 LAB — COMPREHENSIVE METABOLIC PANEL
ALT: 26 U/L (ref 0–44)
AST: 39 U/L (ref 15–41)
Albumin: 2.4 g/dL — ABNORMAL LOW (ref 3.5–5.0)
Alkaline Phosphatase: 99 U/L (ref 38–126)
Anion gap: 2 — ABNORMAL LOW (ref 5–15)
BUN: 24 mg/dL — ABNORMAL HIGH (ref 8–23)
CO2: 25 mmol/L (ref 22–32)
Calcium: 8.7 mg/dL — ABNORMAL LOW (ref 8.9–10.3)
Chloride: 110 mmol/L (ref 98–111)
Creatinine, Ser: 1.07 mg/dL (ref 0.61–1.24)
GFR, Estimated: >60 mL/min (ref >60)
Glucose, Bld: 83 mg/dL (ref 70–99)
Potassium: 3.8 mmol/L (ref 3.5–5.1)
Sodium: 137 mmol/L (ref 135–145)
Total Bilirubin: 1.4 mg/dL — ABNORMAL HIGH (ref 0.3–1.2)
Total Protein: 6 g/dL — ABNORMAL LOW (ref 6.5–8.1)

## 2022-08-31 LAB — CBG MONITORING, ED
Glucose-Capillary: 89 mg/dL (ref 70–99)
Glucose-Capillary: 79 mg/dL (ref 70–99)
Glucose-Capillary: 72 mg/dL (ref 70–99)
Glucose-Capillary: 92 mg/dL (ref 70–99)
Glucose-Capillary: 120 mg/dL — ABNORMAL HIGH (ref 70–99)
Glucose-Capillary: 85 mg/dL (ref 70–99)
Glucose-Capillary: 94 mg/dL (ref 70–99)

## 2022-08-31 LAB — CBC
HCT: 32.1 % — ABNORMAL LOW (ref 39.0–52.0)
Hemoglobin: 10.4 g/dL — ABNORMAL LOW (ref 13.0–17.0)
MCH: 31 pg (ref 26.0–34.0)
MCHC: 32.4 g/dL (ref 30.0–36.0)
MCV: 95.8 fL (ref 80.0–100.0)
Platelets: 64 10*3/uL — ABNORMAL LOW (ref 150–400)
RBC: 3.35 MIL/uL — ABNORMAL LOW (ref 4.22–5.81)
RDW: 17.9 % — ABNORMAL HIGH (ref 11.5–15.5)
WBC: 3.1 10*3/uL — ABNORMAL LOW (ref 4.0–10.5)
nRBC: 0 % (ref 0.0–0.2)

## 2022-08-31 LAB — FOLATE: Folate: 10.8 ng/mL (ref >5.9)

## 2022-08-31 LAB — MAGNESIUM: Magnesium: 2.1 mg/dL (ref 1.7–2.4)

## 2022-08-31 LAB — LACTIC ACID, PLASMA: Lactic Acid, Venous: 0.9 mmol/L (ref 0.5–1.9)

## 2022-08-31 LAB — TROPONIN I (HIGH SENSITIVITY): Troponin I (High Sensitivity): 11 ng/L (ref <18)

## 2022-08-31 LAB — CORTISOL: Cortisol, Plasma: 6.4 ug/dL

## 2022-08-31 LAB — PHOSPHORUS: Phosphorus: 3.1 mg/dL (ref 2.5–4.6)

## 2022-08-31 MED ORDER — ADULT MULTIVITAMIN W/MINERALS CH
1.0000 | ORAL_TABLET | Freq: Every day | ORAL | Status: DC
  Administered 2022-08-31 – 2022-09-01 (×2): 1 via ORAL
  Filled 2022-08-31 (×2): qty 1

## 2022-08-31 MED ORDER — SODIUM CHLORIDE 0.9 % IV BOLUS
500.0000 mL | Freq: Once | INTRAVENOUS | Status: AC
  Administered 2022-08-31: 500 mL via INTRAVENOUS

## 2022-08-31 MED ORDER — CYCLOBENZAPRINE HCL 10 MG PO TABS: 5.0000 mg | ORAL_TABLET | Freq: Every evening | ORAL | Status: DC | PRN

## 2022-08-31 MED ORDER — ACETAMINOPHEN 650 MG RE SUPP: 650.0000 mg | Freq: Four times a day (QID) | RECTAL | Status: DC | PRN

## 2022-08-31 MED ORDER — HYDRALAZINE HCL 50 MG PO TABS: 50.0000 mg | ORAL_TABLET | Freq: Four times a day (QID) | ORAL | Status: DC | PRN

## 2022-08-31 MED ORDER — OXYCODONE-ACETAMINOPHEN 5-325 MG PO TABS
1.0000 | ORAL_TABLET | ORAL | Status: DC | PRN
  Administered 2022-08-31: 1 via ORAL
  Filled 2022-08-31: qty 1

## 2022-08-31 MED ORDER — GADOBUTROL 1 MMOL/ML IV SOLN
9.0000 mL | Freq: Once | INTRAVENOUS | Status: AC | PRN
  Administered 2022-08-31: 9 mL via INTRAVENOUS

## 2022-08-31 MED ORDER — ALBUTEROL SULFATE (2.5 MG/3ML) 0.083% IN NEBU: 3.0000 mL | INHALATION_SOLUTION | RESPIRATORY_TRACT | Status: DC | PRN

## 2022-08-31 MED ORDER — DIAZEPAM 5 MG/ML IJ SOLN
2.0000 mg | Freq: Once | INTRAMUSCULAR | Status: AC
  Administered 2022-08-31: 2 mg via INTRAVENOUS
  Filled 2022-08-31: qty 2

## 2022-08-31 MED ORDER — PANTOPRAZOLE SODIUM 40 MG PO TBEC
40.0000 mg | DELAYED_RELEASE_TABLET | Freq: Every day | ORAL | Status: DC
  Administered 2022-08-31 – 2022-09-01 (×2): 40 mg via ORAL
  Filled 2022-08-31 (×2): qty 1

## 2022-08-31 MED ORDER — ENOXAPARIN SODIUM 40 MG/0.4ML IJ SOSY: 40.0000 mg | PREFILLED_SYRINGE | INTRAMUSCULAR | Status: DC

## 2022-08-31 MED ORDER — TAMSULOSIN HCL 0.4 MG PO CAPS
0.4000 mg | ORAL_CAPSULE | Freq: Every evening | ORAL | Status: DC
  Administered 2022-08-31: 0.4 mg via ORAL
  Filled 2022-08-31: qty 1

## 2022-08-31 MED ORDER — ENOXAPARIN SODIUM 60 MG/0.6ML IJ SOSY
0.5000 mg/kg | PREFILLED_SYRINGE | INTRAMUSCULAR | Status: DC
  Administered 2022-08-31: 55 mg via SUBCUTANEOUS
  Filled 2022-08-31: qty 0.6

## 2022-08-31 MED ORDER — INSULIN ASPART 100 UNIT/ML IJ SOLN: 0.0000 [IU] | Freq: Three times a day (TID) | INTRAMUSCULAR | Status: DC

## 2022-08-31 MED ORDER — INSULIN ASPART 100 UNIT/ML IJ SOLN
0.0000 [IU] | Freq: Every day | INTRAMUSCULAR | Status: DC
  Administered 2022-08-31: 0 [IU] via SUBCUTANEOUS

## 2022-08-31 MED ORDER — OXYCODONE-ACETAMINOPHEN 5-325 MG PO TABS
1.0000 | ORAL_TABLET | Freq: Four times a day (QID) | ORAL | Status: DC | PRN
  Administered 2022-09-01: 1 via ORAL
  Filled 2022-08-31: qty 1

## 2022-08-31 MED ORDER — DIAZEPAM 5 MG/ML IJ SOLN: 2.5000 mg | Freq: Once | INTRAMUSCULAR | Status: DC

## 2022-08-31 MED ORDER — ENSURE ENLIVE PO LIQD
237.0000 mL | Freq: Three times a day (TID) | ORAL | Status: DC
  Administered 2022-08-31 – 2022-09-01 (×3): 237 mL via ORAL

## 2022-08-31 MED ORDER — FUROSEMIDE 10 MG/ML IJ SOLN
40.0000 mg | Freq: Two times a day (BID) | INTRAMUSCULAR | Status: DC
  Administered 2022-08-31 – 2022-09-01 (×2): 40 mg via INTRAVENOUS
  Filled 2022-08-31 (×2): qty 4

## 2022-08-31 MED ORDER — ACETAMINOPHEN 325 MG PO TABS: 650.0000 mg | ORAL_TABLET | Freq: Four times a day (QID) | ORAL | Status: DC | PRN

## 2022-08-31 MED ORDER — MIDODRINE HCL 5 MG PO TABS: 5.0000 mg | ORAL_TABLET | Freq: Three times a day (TID) | ORAL | Status: DC | PRN

## 2022-08-31 MED ORDER — SODIUM CHLORIDE 0.9 % IV SOLN
1.0000 g | INTRAVENOUS | Status: DC
  Administered 2022-08-31 – 2022-09-01 (×2): 1 g via INTRAVENOUS
  Filled 2022-08-31: qty 1
  Filled 2022-08-31: qty 10

## 2022-08-31 MED ORDER — SODIUM CHLORIDE 0.9 % IV SOLN: INTRAVENOUS | Status: DC

## 2022-08-31 MED ORDER — PAROXETINE HCL 10 MG PO TABS
10.0000 mg | ORAL_TABLET | Freq: Every day | ORAL | Status: DC
  Administered 2022-08-31 – 2022-09-01 (×2): 10 mg via ORAL
  Filled 2022-08-31 (×2): qty 1

## 2022-08-31 MED ORDER — HYDRALAZINE HCL 20 MG/ML IJ SOLN: 10.0000 mg | Freq: Four times a day (QID) | INTRAMUSCULAR | Status: DC | PRN

## 2022-08-31 MED ORDER — MIDODRINE HCL 5 MG PO TABS
5.0000 mg | ORAL_TABLET | Freq: Once | ORAL | Status: AC
  Administered 2022-08-31: 5 mg via ORAL
  Filled 2022-08-31: qty 1

## 2022-08-31 MED ORDER — ATORVASTATIN CALCIUM 20 MG PO TABS
40.0000 mg | ORAL_TABLET | Freq: Every day | ORAL | Status: DC
  Administered 2022-08-31 – 2022-09-01 (×2): 40 mg via ORAL
  Filled 2022-08-31 (×2): qty 2

## 2022-08-31 NOTE — ED Notes (Signed)
Sharion Settler, NP informed of pts BP of 94/55 and heart rate of 55

## 2022-08-31 NOTE — ED Notes (Signed)
Request made for transport to the floor ?

## 2022-08-31 NOTE — TOC Initial Note (Signed)
Transition of Care [facility]) - Initial/Assessment Note    Patient Details  Name: [name] MRN: [id number] Date of Birth: [birth year]  [facility] CM/SW Contact:    Shelbie Hutching, RN Phone Number: 08/31/2022, 4:09 PM  Clinical Narrative:                 Met with patient at the beside and reviewed MOON.  Patient placed under observation for weakness.  Patient has been staying in the woods with friends in a tent.  When patient discharges he will go back to the woods.    Expected Discharge Plan: Homeless Shelter Barriers to Discharge: Continued Medical Work up   Patient Goals and CMS Choice Patient states their goals for this hospitalization and ongoing recovery are:: to feel better          Expected Discharge Plan and Services       Living arrangements for the past 2 months: Homeless                           HH Arranged: NA [place] Agency: NA        Prior Living Arrangements/Services Living arrangements for the past 2 months: Homeless Lives with:: Friends Patient language and need for interpreter reviewed:: Yes Do you feel safe going back to the place where you live?: Yes      Need for Family Participation in Patient Care: Yes (Comment) Care giver support system in place?: No (comment)   Criminal Activity/Legal Involvement Pertinent to Current Situation/Hospitalization: No - Comment as needed  Activities of Daily Living      Permission Sought/Granted                  Emotional Assessment Appearance:: Appears stated age Attitude/Demeanor/Rapport: Engaged Affect (typically observed): Accepting Orientation: : Oriented to Self, Oriented to Place, Oriented to  Time, Oriented to Situation Alcohol / Substance Use: Not Applicable Psych Involvement: No (comment)  Admission diagnosis:  Hypotension [I95.9] Patient Active Problem List   Diagnosis Date Noted   Protein-calorie malnutrition, severe ([place]) 08/31/2022   Hypotension 08/31/2022   Right  sided weakness    TIA (transient ischemic attack) 04/28/2022   Obesity    Chronic diastolic CHF (congestive heart failure) ([place])    Depression    Sepsis ([place]) 03/28/2022   Bacteremia due to Klebsiella pneumoniae 03/28/2022   Hypoglycemia 03/28/2022   Syncope 03/24/2022   Hypokalemia 03/24/2022   Fever 03/24/2022   COPD (chronic obstructive pulmonary disease) (HCC)    Elevated troponin    Abnormal LFTs    History of hepatitis C    AKI (acute kidney injury) ([place])    Pure hypercholesterolemia    Pneumonia due to COVID-19 virus 04/19/2020   Elevated transaminase level 04/18/2020   Obesity, Class III, BMI 40-49.9 (morbid obesity) ([place]) 04/18/2020   Lactic acidosis 04/17/2020   Thrombocytopenia ([place]) 04/17/2020   Leukopenia 04/17/2020   COVID-19 virus infection 04/16/2020   HTN (hypertension) 04/17/2018   Uncontrolled type 2 diabetes mellitus with hyperglycemia, without long-term current use of insulin ([place]) 04/17/2018   Lymphedema 04/17/2018   Anemia 04/07/2018   Bilateral leg pain 03/29/2018   Chest pain 03/20/2018   Unsheltered homelessness 12/16/2017   PCP:  Denton Lank, MD Pharmacy:   [facility] [address] ([address]), [place] [address] [place]) [place] [address] Phone: [phone] Fax: [phone]     Social Determinants of Health (  SDOH) Social History: SDOH Screenings   Food Insecurity: Food Insecurity Present (04/27/2022)  Housing: High Risk (04/26/2022)  Transportation Needs: Unmet Transportation Needs (04/27/2022)  Alcohol Screen: Low Risk  (04/30/2018)  Depression (PHQ2-9): High Risk (04/26/2022)  Financial Resource Strain: High Risk (04/27/2022)  Physical Activity: Sufficiently Active (04/16/2018)  Social Connections: Moderately Isolated (04/30/2018)  Stress: Stress Concern Present (04/16/2018)  Tobacco Use: Medium Risk (08/31/2022)   SDOH Interventions:     Readmission Risk Interventions     No data to display

## 2022-08-31 NOTE — ED Notes (Signed)
Report given to Henry, RN

## 2022-08-31 NOTE — ED Notes (Signed)
Report given to Rachel, RN.

## 2022-08-31 NOTE — Care Management Obs Status (Signed)
[facility] NOTIFICATION   Patient Details  Name: [name] MRN: [id number] Date of Birth: 1951/02/08   Medicare Observation Status Notification Given:  Yes    Shelbie Hutching, RN 08/31/2022, 3:16 PM

## 2022-08-31 NOTE — Progress Notes (Signed)
PHARMACIST - PHYSICIAN COMMUNICATION  CONCERNING:  Enoxaparin (Lovenox) for DVT Prophylaxis    RECOMMENDATION: Patient was prescribed enoxaprin '40mg'$  q24 hours for VTE prophylaxis.   Filed Weights   08/31/22 0630  Weight: 112 kg (246 lb 14.6 oz)    Body mass index is 39.85 kg/m.  Estimated Creatinine Clearance: 72.3 mL/min (by C-G formula based on SCr of 1.07 mg/dL).   Based on [place] patient is candidate for enoxaparin 0.'5mg'$ /kg TBW SQ every 24 hours based on BMI being >30.   DESCRIPTION: Pharmacy has adjusted enoxaparin dose per [facility] policy.  Patient is now receiving enoxaparin 55 mg every 24 hours    Pernell Dupre, PharmD, BCPS Clinical Pharmacist 08/31/2022 8:45 AM

## 2022-08-31 NOTE — Progress Notes (Addendum)
[facility] Progress Note  Patient: [name]    HBZ:[id number]  DOA: 08/30/2022     Date of Service: the patient was seen and examined on 08/31/2022  Chief Complaint  Patient presents with   Weakness   Brief hospital course: LUNDY COZART is a 72 y.o. male with medical history significant of hypertension, Dm2, CHF (diastolic), OSA , Asthma/ Copd cirrhosis apparently felt like he was having difficulty with balance and moving his extremities.  Patient was found to have hypotension and bradycardic.  Patient was given IV fluid bolus, BP improved. In ED, T 97.9 P 62 Bp 86/42 pox 92% on RA Wbc 5.2, Hgb 11.5, Plt 93 Ast 46, Alt 29, Alk phos 89, T. Bili 1.7 Trop 14 Lactic acid 1.3 Urinalysis wbc 6-10, rbc 21.50 CT brain: No acute intracranial abnormality. CTA chest: No evidence of pulmonary embolism. Mild to moderate severity bilateral lower lobe atelectasis. Stable nondisplaced fracture deformity involving the body of the sternum. Cardiomegaly with marked severity coronary artery calcification. Dilated main pulmonary artery which may represent pulmonary hypertension. Cirrhotic liver. Evidence of prior cholecystectomy.  Aortic atherosclerosis. CT abd/ pelvis: Cirrhosis with no definite evidence of portal hypertension. Poorly visualized hypodense 2.2 cm right peripheral posterior hepatic lobe mass. Query other finding along the hepatic dome that is poorly visualized. Findings suspicious for a hepatocellular carcinoma. Please note that liver protocol enhanced MR and CT are the most sensitive tests for the screening detection of hepatocellular carcinoma in the high risk setting of cirrhosis. When the patient is clinically stable and able to follow directions and hold their breath (preferably as an outpatient) further evaluation with dedicated outpatient MRI liver protocol should be considered.  2. A 6 mm right nephrolithiasis. 3. Enlarged common bile duct which can be seen in the  post cholecystectomy setting.   Pt was given ns iv hydration in the ED, and bp improved some.  Pt appears dry clinically.  Pt will be admitted for hypotension    Assessment and Plan:  # Hypotension resolved s/p IVF trop negative x 2 cortisol 6.4 at lower end Check cardiac echo Started midodrine 5 mg p.o. 3 times daily as needed   Bradycardia Continue to monitor on telemetry Check ambulatory heart rate Discussed with cardiology, recommended no intervention, no need of pacemaker at this time Follow-up as an outpatient History of irregular rhythm, patient stated that he received a shock and ablation in the past   Low Back pain  MRI L spine: 1. No spinal canal stenosis. 2. Mild neural foraminal narrowing bilaterally at L3-L4 and L4-L5 and on the left at L5-S1. 3. Narrowing of the lateral recesses at L3-L4 could affect the descending L4 nerve roots. 4. Annular fissures at L3-L4 and L4-L5, which can be acutely painful. Continue current medication for pain control   Anemia, Thrombocytopenia most likely due to underlying liver cirrhosis Check cbc in am   Dm2 HOld Metformin Hold Jardiance Fsbs ac and qhs, ISS   Hypertension Patient presented with hypotension Held lisinopril   CHF p EF Started Lasix 40 mg IV twice daily    Liver cirrhosis and peripheral edema Started Lasix 40 mg IV twice daily   Liver lesion, incidental finding, need to rule out hepatocellular carcinoma. Abnormal liver function MRI: 2.5 cm posterior right hepatic lobe lesion, iaging features are compatible with a LI-RADS 4 lesion, probably hepatocellular carcinoma. Consider repeat MRI after resolution of acute symptoms when patient is better able to participate in positioning and reproducible breath holding.  Liver cirrhosis, portal hypertension, gastric wall thickening possibly gastritis, diffuse body wall edema. Check alpha feta protein Patient was advised to follow with PCP to repeat MRI liver as an  outpatient and may need biopsy as an outpatient.   Acute lower UTI Urine culture pending Rocephin 1gm iv qday    Body mass index is 39.85 kg/m.  Interventions:       Diet: Heart healthy/carb modified DVT Prophylaxis: SCD, pharmacological prophylaxis contraindicated due to thrombocytopenia    Advance goals of care discussion: Full code  Family Communication: family was NOT present at bedside, at the time of interview.  The pt provided permission to discuss medical plan with the family. Opportunity was given to ask question and all questions were answered satisfactorily.   Disposition:  Pt is Homeless, admitted with Hypotension and bradycardia, still has Bradycardia and at risk of fall, which precludes a safe discharge. Discharge to shelter TBD, when medically stable, may need 1 to 2 days, to improve. Follow PT and OT eval   Subjective: No significant overnight events, patient was admitted due to hypotension, noticed bradycardia on the monitor.  Patient is feeling improvement in his symptoms, patient says that he gets iron infusion weekly and he could not get it yesterday because of feeling weak and imbalance. Denies any chest pain or palpitation, no shortness of breath, no any other active issues.  Physical Exam: General: NAD, lying comfortably Appear in no distress, affect appropriate Eyes: PERRLA ENT: Oral Mucosa Clear, moist  Neck: no JVD,  Cardiovascular: S1 and S2 Present, no Murmur, bradycardia, Respiratory: good respiratory effort, Bilateral Air entry equal and Decreased, no Crackles, no wheezes Abdomen: Bowel Sound present, Soft and no tenderness,  Skin: no rashes Extremities: 2-3+ Pedal edema, no calf tenderness Neurologic: without any new focal findings Gait not checked due to patient safety concerns  Vitals:   08/31/22 0630 08/31/22 0645 08/31/22 0911 08/31/22 1337  BP:  (!) 142/72 130/68 133/64  Pulse:   (!) 54 (!) 56  Resp:   16 18  Temp:   98 F (36.7 C)  97.7 F (36.5 C)  TempSrc:    Oral  SpO2:   98% 100%  Weight: 112 kg     Height: '5\' 6"'$  (1.676 m)       Intake/Output Summary (Last 24 hours) at 08/31/2022 1506 Last data filed at 08/31/2022 0522 Gross per 24 hour  Intake 3100 ml  Output --  Net 3100 ml   Filed Weights   08/31/22 0630  Weight: 112 kg    Data Reviewed: I have personally reviewed and interpreted daily labs, tele strips, imagings as discussed above. I reviewed all nursing notes, pharmacy notes, vitals, pertinent old records I have discussed plan of care as described above with RN and patient/family.  CBC: Recent Labs  Lab 08/30/22 1708 08/31/22 0528  WBC 5.2 3.1*  HGB 11.5* 10.4*  HCT 35.2* 32.1*  MCV 94.4 95.8  PLT 93* 64*   Basic Metabolic Panel: Recent Labs  Lab 08/30/22 1708 08/31/22 0528  NA 138 137  K 3.5 3.8  CL 107 110  CO2 23 25  GLUCOSE 115* 83  BUN 19 24*  CREATININE 1.21 1.07  CALCIUM 8.9 8.7*  MG  --  2.1  PHOS  --  3.1    Studies: MR ABDOMEN W WO CONTRAST  Result Date: 08/31/2022 CLINICAL DATA:  Cirrhotic liver with liver lesions seen on recent CT scan. EXAM: MRI ABDOMEN WITHOUT AND WITH CONTRAST TECHNIQUE: Multiplanar multisequence  MR imaging of the abdomen was performed both before and after the administration of intravenous contrast. CONTRAST:  15m GADAVIST GADOBUTROL 1 MMOL/ML IV SOLN COMPARISON:  CT scan 08/30/2022 FINDINGS: Lower chest: Subsegmental atelectasis noted in the lung bases. Hepatobiliary: Markedly motion degraded imaging of the liver. Small or subtle lesions could be obscured. Nodular liver parenchyma consistent with cirrhosis. 2.5 cm posterior right hepatic lobe lesion seen on yesterday's CT imaging is subtle by MRI but visible on pre contrast T1 image 35 of series 12. The marked motion artifact hinders qualitative assessment and accurate measurement. This lesion appears isointense to liver parenchyma on T2 imaging and shows intrinsic T1 shortening on precontrast T1  imaging. There may be some minimal enhancement on arterial phase imaging the becomes isointense to liver parenchyma on more delayed postcontrast imaging. No clear washout and no enhancing rim. Lesion does demonstrate restricted diffusion (see image 113/series 7). Gallbladder is surgically absent. Common duct measures up to 15 mm diameter. No evidence for choledocholithiasis. Pancreas: No focal mass lesion. No dilatation of the main duct. No intraparenchymal cyst. No peripancreatic edema. Although assessment is limited by motion. Spleen:  No gross abnormality within the spleen. Adrenals/Urinary Tract: No adrenal nodule or mass. Kidneys unremarkable. Stomach/Bowel: Stomach is nondistended. There appears to be diffuse gastric wall thickening which may be related to underdistention although gastritis not excluded. No small bowel or colonic dilatation within the visualized abdomen. Vascular/Lymphatic: No abdominal aortic aneurysm. No abdominal lymphadenopathy. Portal vein, superior mesenteric vein, and splenic vein are patent. Marked enlargement/recanalization of the paraumbilical vein evident. Other:  No substantial ascites. Musculoskeletal: Diffuse body wall edema evident. No focal suspicious marrow enhancement within the visualized bony anatomy. IMPRESSION: 1. Markedly motion degraded imaging of the liver. 2.5 cm posterior right hepatic lobe lesion seen on yesterday's CT imaging is partially obscured by motion. There may be some minimal enhancement on arterial phase imaging that becomes isointense to liver parenchyma on more delayed postcontrast imaging. Although there is no washout or enhancing rim, the lesion does demonstrate restricted diffusion. Imaging features are compatible with a LI-RADS 4 lesion, probably hepatocellular carcinoma. Consider repeat MRI after resolution of acute symptoms when patient is better able to participate in positioning and reproducible breath holding. 2. Nodular liver parenchyma  consistent with cirrhosis. 3. Marked enlargement/recanalization of the paraumbilical vein consistent with portal venous hypertension. 4. Diffuse gastric wall thickening may be related to underdistention although other etiology such as gastritis not excluded. 5. Diffuse body wall edema. Electronically Signed   By: EMisty StanleyM.D.   On: 08/31/2022 05:26   MR LUMBAR SPINE WO CONTRAST  Result Date: 08/31/2022 CLINICAL DATA:  Weakness EXAM: MRI LUMBAR SPINE WITHOUT CONTRAST TECHNIQUE: Multiplanar, multisequence MR imaging of the lumbar spine was performed. No intravenous contrast was administered. COMPARISON:  No prior MRI of the lumbar spine available FINDINGS: Segmentation:  5 lumbar type vertebral bodies. Alignment: No listhesis. Mild dextrocurvature. Preservation of the normal lumbar lordosis. Vertebrae:  No fracture, evidence of discitis, or bone lesion. Conus medullaris and cauda equina: Conus extends to the L2 level. Conus and cauda equina appear normal. Paraspinal and other soft tissues: Negative. Disc levels: T12-L1: No significant disc bulge. No spinal canal stenosis or neural foraminal narrowing. L1-L2: No significant disc bulge. No spinal canal stenosis or neural foraminal narrowing. L2-L3: No significant disc bulge. No spinal canal stenosis or neural foraminal narrowing. L3-L4: Mild disc bulge with small central protrusion with annular fissure. Mild facet arthropathy. Narrowing of the lateral recesses. No  spinal canal stenosis. Mild bilateral neural foraminal narrowing. L4-L5: Minimal disc bulge with central annular fissure. Mild-to-moderate facet arthropathy. No spinal canal stenosis. Mild bilateral neural foraminal narrowing. L5-S1: No significant disc bulge. Mild facet arthropathy. No spinal canal stenosis. Mild left neural foraminal narrowing. IMPRESSION: 1. No spinal canal stenosis. 2. Mild neural foraminal narrowing bilaterally at L3-L4 and L4-L5 and on the left at L5-S1. 3. Narrowing of the  lateral recesses at L3-L4 could affect the descending L4 nerve roots. 4. Annular fissures at L3-L4 and L4-L5, which can be acutely painful. Electronically Signed   By: Merilyn Baba M.D.   On: 08/31/2022 03:41   CT HEAD WO CONTRAST (5MM)  Result Date: 08/31/2022 CLINICAL DATA:  Mental status change, unknown cause EXAM: CT HEAD WITHOUT CONTRAST TECHNIQUE: Contiguous axial images were obtained from the base of the skull through the vertex without intravenous contrast. RADIATION DOSE REDUCTION: This exam was performed according to the departmental dose-optimization program which includes automated exposure control, adjustment of the mA and/or kV according to patient size and/or use of iterative reconstruction technique. COMPARISON:  CT head 08/12/2022 FINDINGS: Brain: No evidence of large-territorial acute infarction. No parenchymal hemorrhage. No mass lesion. No extra-axial collection. No mass effect or midline shift. No hydrocephalus. Basilar cisterns are patent. Vascular: No hyperdense vessel. Skull: No acute fracture or focal lesion. Sinuses/Orbits: Paranasal sinuses and mastoid air cells are clear. The orbits are unremarkable. Other: None. IMPRESSION: No acute intracranial abnormality. Electronically Signed   By: Iven Finn M.D.   On: 08/31/2022 00:45   CT ABDOMEN PELVIS W CONTRAST  Result Date: 08/30/2022 CLINICAL DATA:  Abdominal pain, acute, nonlocalized EXAM: CT ABDOMEN AND PELVIS WITH CONTRAST TECHNIQUE: Multidetector CT imaging of the abdomen and pelvis was performed using the standard protocol following bolus administration of intravenous contrast. RADIATION DOSE REDUCTION: This exam was performed according to the departmental dose-optimization program which includes automated exposure control, adjustment of the mA and/or kV according to patient size and/or use of iterative reconstruction technique. CONTRAST:  128m OMNIPAQUE IOHEXOL 350 MG/ML SOLN COMPARISON:  CT chest 08/12/2022 FINDINGS: Lower  chest: No acute abnormality. At least two vessel coronary calcification. Hepatobiliary: Nodular hepatic contour. Redemonstration of a poorly visualized hypodense 2.2 cm right peripheral posterior hepatic lobe mass (4:22). Query other finding along the hepatic dome that is poorly visualized (4:17). Status post cholecystectomy. No intrahepatic biliary dilatation. Common bile duct enlarged measuring up to 1.7 cm which can be seen in the setting of post cholecystectomy setting. Pancreas: No focal lesion. Normal pancreatic contour. No surrounding inflammatory changes. No main pancreatic ductal dilatation. Spleen: Normal in size without focal abnormality. Splenules are noted. Adrenals/Urinary Tract: No adrenal nodule bilaterally. Bilateral kidneys enhance symmetrically. Right kidney nephrolithiasis measuring up to 6 mm. No left nephrolithiasis. No hydronephrosis. No hydroureter. The urinary bladder is unremarkable. Stomach/Bowel: Stomach is within normal limits. No evidence of bowel wall thickening or dilatation. Appendix appears normal. Vascular/Lymphatic: The main portal, splenic, superior mesenteric veins are patent. No abdominal aorta or iliac aneurysm. Mild atherosclerotic plaque of the aorta and its branches. No abdominal, pelvic, or inguinal lymphadenopathy. Reproductive: Prostate is unremarkable. Other: No intraperitoneal free fluid. No intraperitoneal free gas. No organized fluid collection. Musculoskeletal: No abdominal wall hernia or abnormality. No suspicious lytic or blastic osseous lesions. No acute displaced fracture. Multilevel degenerative changes of the spine. IMPRESSION: 1. Cirrhosis with no definite evidence of portal hypertension. Poorly visualized hypodense 2.2 cm right peripheral posterior hepatic lobe mass. Query other finding along the hepatic  dome that is poorly visualized. Findings suspicious for a hepatocellular carcinoma. Please note that liver protocol enhanced MR and CT are the most  sensitive tests for the screening detection of hepatocellular carcinoma in the high risk setting of cirrhosis. When the patient is clinically stable and able to follow directions and hold their breath (preferably as an outpatient) further evaluation with dedicated outpatient MRI liver protocol should be considered. 2. A 6 mm right nephrolithiasis. 3. Enlarged common bile duct which can be seen in the post cholecystectomy setting. Electronically Signed   By: Iven Finn M.D.   On: 08/30/2022 20:50   CT Angio Chest PE W and/or Wo Contrast  Result Date: 08/30/2022 CLINICAL DATA:  Weakness. EXAM: CT ANGIOGRAPHY CHEST WITH CONTRAST TECHNIQUE: Multidetector CT imaging of the chest was performed using the standard protocol during bolus administration of intravenous contrast. Multiplanar CT image reconstructions and MIPs were obtained to evaluate the vascular anatomy. RADIATION DOSE REDUCTION: This exam was performed according to the departmental dose-optimization program which includes automated exposure control, adjustment of the mA and/or kV according to patient size and/or use of iterative reconstruction technique. CONTRAST:  140m OMNIPAQUE IOHEXOL 350 MG/ML SOLN COMPARISON:  August 12, 2022 FINDINGS: Cardiovascular: There is mild calcification of the aortic arch, without evidence of aortic aneurysm. Satisfactory opacification of the pulmonary arteries to the segmental level. No evidence of pulmonary embolism. The main pulmonary artery is dilated and measures 4.4 cm. The cardiac silhouette is mildly enlarged with marked severity coronary artery calcification. No pericardial effusion. Mediastinum/Nodes: No enlarged mediastinal, hilar, or axillary lymph nodes. Thyroid gland, trachea, and esophagus demonstrate no significant findings. Lungs/Pleura: Mild to moderate severity atelectasis is seen along the posterior aspect of the bilateral lower lobes. There is no evidence of a pleural effusion or pneumothorax.  Upper Abdomen: The liver is cirrhotic in appearance. Radiopaque surgical clips are seen within the gallbladder fossa. Musculoskeletal: A stable nondisplaced fracture deformity is seen involving the body of the sternum. Multilevel degenerative changes are seen throughout the thoracic spine. Review of the MIP images confirms the above findings. IMPRESSION: 1. No evidence of pulmonary embolism. 2. Mild to moderate severity bilateral lower lobe atelectasis. 3. Stable nondisplaced fracture deformity involving the body of the sternum. 4. Cardiomegaly with marked severity coronary artery calcification. 5. Dilated main pulmonary artery which may represent pulmonary hypertension. 6. Cirrhotic liver. 7. Evidence of prior cholecystectomy. 8. Aortic atherosclerosis. Aortic Atherosclerosis (ICD10-I70.0). Electronically Signed   By: TVirgina NorfolkM.D.   On: 08/30/2022 20:46   DG Chest 1 View  Result Date: 08/30/2022 CLINICAL DATA:  Shortness of breath, weakness EXAM: CHEST  1 VIEW COMPARISON:  08/12/2022 FINDINGS: Cardiomegaly. Both lungs are clear. The visualized skeletal structures are unremarkable. IMPRESSION: Cardiomegaly without acute abnormality of the lungs in AP portable projection. Electronically Signed   By: ADelanna AhmadiM.D.   On: 08/30/2022 19:17    Scheduled Meds:  atorvastatin  40 mg Oral Daily   diazepam  2.5 mg Intravenous Once   enoxaparin (LOVENOX) injection  0.5 mg/kg Subcutaneous Q24H   feeding supplement  237 mL Oral TID BM   insulin aspart  0-5 Units Subcutaneous QHS   insulin aspart  0-6 Units Subcutaneous TID WC   multivitamin with minerals  1 tablet Oral Daily   pantoprazole  40 mg Oral Daily   PARoxetine  10 mg Oral Daily   tamsulosin  0.4 mg Oral QPM   Continuous Infusions:  cefTRIAXone (ROCEPHIN)  IV Stopped (08/31/22 0522)  PRN Meds: acetaminophen **OR** acetaminophen, albuterol, cyclobenzaprine, hydrALAZINE, hydrALAZINE, oxyCODONE-acetaminophen  Time spent: 35  minutes  Author: Val Riles. MD [facility] 08/31/2022 3:06 PM  To reach On-call, see care teams to locate the attending and reach out to them via [email or web address]. If 7PM-7AM, please contact night-coverage If you still have difficulty reaching the attending provider, please page the [facility] (Director on Call) for [facility] on amion for assistance.

## 2022-08-31 NOTE — H&P (Addendum)
History and Physical    Patient: [name] VZC:[id number] DOB: [birth year] DOA: 08/30/2022 DOS: the patient was seen and examined on 08/31/2022 PCP: Denton Lank, MD  Patient coming from: Homeless  Chief Complaint:  Chief Complaint  Patient presents with   Weakness   HPI: [name] is a 72 y.o. male with medical history significant of hypertension, Dm2, CHF (diastolic), OSA , Asthma/ Copd cirrhosis apparently felt like he was having difficulty with balance and moving his extremities.  Pt denies headache, cp, palp, sob, n/v, abd pain, diarrhea, brbpr, black stool, dysuria hematuria.    In ED, T 97.9 P 62 Bp 86/42 pox 92% on RA  Wbc 5.2, Hgb 11.5, Plt 93 Ast 46, Alt 29, Alk phos 89, T. Bili 1.7 Trop 14 Lactic acid 1.3  Urinalysis wbc 6-10, rbc 21.50  CT brain  IMPRESSION: No acute intracranial abnormality.  CTA chest  IMPRESSION: 1. No evidence of pulmonary embolism. 2. Mild to moderate severity bilateral lower lobe atelectasis. 3. Stable nondisplaced fracture deformity involving the body of the sternum. 4. Cardiomegaly with marked severity coronary artery calcification. 5. Dilated main pulmonary artery which may represent pulmonary hypertension. 6. Cirrhotic liver. 7. Evidence of prior cholecystectomy. 8. Aortic atherosclerosis.   CT abd/ pelvis  IMPRESSION: 1. Cirrhosis with no definite evidence of portal hypertension. Poorly visualized hypodense 2.2 cm right peripheral posterior hepatic lobe mass. Query other finding along the hepatic dome that is poorly visualized. Findings suspicious for a hepatocellular carcinoma. Please note that liver protocol enhanced MR and CT are the most sensitive tests for the screening detection of hepatocellular carcinoma in the high risk setting of cirrhosis. When the patient is clinically stable and able to follow directions and hold their breath (preferably as an outpatient) further evaluation with dedicated outpatient MRI  liver protocol should be considered. 2. A 6 mm right nephrolithiasis. 3. Enlarged common bile duct which can be seen in the post cholecystectomy setting.  Pt was given ns iv hydration in the ED, and bp improved some.  Pt appears dry clinically.  Pt will be admitted for hypotension.   Review of Systems: negative for all 10 organ systems except for + above  Past Medical History:  Diagnosis Date   Asthma    CHF (congestive heart failure) (HCC)    COPD (chronic obstructive pulmonary disease) ([place])    COVID-19 03/2020   diagnosed in August 2021   Diabetes mellitus without complication ([place])    Homelessness    Hypertension    Infestation by bed bug    Migraine    Obesity    Sleep apnea    Past Surgical History:  Procedure Laterality Date   CARDIAC CATHETERIZATION     CHOLECYSTECTOMY     EYE SURGERY     INNER EAR SURGERY     NOSE SURGERY     Social History:  reports that he has quit smoking. He has never used smokeless tobacco. He reports that he does not drink alcohol and does not use drugs.  Allergies  Allergen Reactions   Bee Venom Anaphylaxis   Codeine Itching    Only itching. Recently allergy tested at [facility]   Eggs Or Egg-Derived Products Nausea And Vomiting   Novocain [Procaine]    Sulfa Antibiotics Other (See Comments)    Family History  Problem Relation Age of Onset   Hypertension Mother    Heart disease Mother    Heart failure Maternal Grandmother     Prior to Admission medications  Medication Sig Start Date End Date Taking? Authorizing Provider  albuterol (PROVENTIL) (2.5 MG/3ML) 0.083% nebulizer solution Take 3 mLs by nebulization every 4 (four) hours as needed for wheezing or shortness of breath. 03/10/22   Vanessa San Castle, MD  albuterol (VENTOLIN HFA) 108 (90 Base) MCG/ACT inhaler Inhale 2 puffs into the lungs every 6 (six) hours as needed for wheezing or shortness of breath. 04/26/22 05/26/22  Alisa Graff, FNP  ascorbic acid (VITAMIN C) 500 MG tablet Take 1  tablet (500 mg total) by mouth daily. 04/20/20   Samuella Cota, MD  aspirin EC 81 MG tablet Take 1 tablet (81 mg total) by mouth daily. Swallow whole. 04/26/22   Alisa Graff, FNP  atorvastatin (LIPITOR) 40 MG tablet Take 1 tablet (40 mg total) by mouth daily. 04/26/22   Alisa Graff, FNP  buprenorphine-naloxone (SUBOXONE) 2-0.5 mg SUBL SL tablet Place 1 tablet under the tongue in the morning and at bedtime. [name]    [provider]  buprenorphine-naloxone (SUBOXONE) 8-2 mg SUBL SL tablet Place 1 tablet under the tongue in the morning and at bedtime. 05/07/22   [provider]  CAPSFENAC PAK 1.5 & 0.025 % THPK Apply 5 g topically 2 (two) times daily as needed. Patient not taking: Reported on 08/13/2022 07/27/22   [provider]  cyclobenzaprine (FLEXERIL) 5 MG tablet Take 5 mg by mouth at bedtime as needed. 05/11/22   [provider]  EPINEPHrine 0.3 mg/0.3 mL IJ SOAJ injection Inject 0.3 mg into the muscle as needed. 07/01/22   [provider]  fluticasone (FLONASE) 50 MCG/ACT nasal spray Place 1 spray into both nostrils daily. Patient not taking: Reported on 08/13/2022    [provider]  furosemide (LASIX) 20 MG tablet Take 20 mg by mouth every morning. 07/27/22   [provider]  furosemide (LASIX) 80 MG tablet Take 1 tablet (80 mg total) by mouth daily. Patient taking differently: Take 80 mg by mouth as needed. 04/26/22   Alisa Graff, FNP  glucosamine-chondroitin 500-400 MG tablet Take 1 tablet by mouth 3 (three) times daily.    [provider]  hydrOXYzine (ATARAX) 10 MG tablet Take 10 mg by mouth 3 (three) times daily as needed. 07/27/22   [provider]  JARDIANCE 10 MG TABS tablet Take 1 tablet (10 mg total) by mouth daily. 04/26/22   Alisa Graff, FNP  lansoprazole (PREVACID) 30 MG capsule Take 30 mg by mouth daily. 05/22/22   [provider]  lisinopril (ZESTRIL) 40 MG tablet Take 1  tablet (40 mg total) by mouth daily. 04/26/22   Alisa Graff, FNP  metFORMIN (GLUCOPHAGE-XR) 500 MG 24 hr tablet Take 1 tablet (500 mg total) by mouth daily. 04/26/22   Alisa Graff, FNP  Multiple Vitamin (MULTIVITAMIN WITH MINERALS) TABS tablet Take 1 tablet by mouth daily.    [provider]  nitroGLYCERIN (NITROSTAT) 0.4 MG SL tablet Place 1 tablet (0.4 mg total) under the tongue every 5 (five) minutes x 3 doses as needed for chest pain. 04/26/22   Alisa Graff, FNP  ondansetron (ZOFRAN-ODT) 4 MG disintegrating tablet Take 1 tablet (4 mg total) by mouth every 6 (six) hours as needed for nausea or vomiting. Patient not taking: Reported on 08/13/2022 05/11/22   Ward, Delice Bison, DO  ondansetron (ZOFRAN-ODT) 4 MG disintegrating tablet Take 1 tablet (4 mg total) by mouth every 8 (eight) hours as needed. 08/09/22   Rada Hay,  MD  oxyCODONE-acetaminophen (PERCOCET) 5-325 MG tablet Take 1 tablet by mouth every 4 (four) hours as needed for severe pain. Patient not taking: Reported on 08/13/2022 07/14/22 07/14/23  Versie Starks, PA-C  PARoxetine (PAXIL) 10 MG tablet Take 1 tablet (10 mg total) by mouth daily. Patient not taking: Reported on 08/13/2022 04/26/22   Alisa Graff, FNP  tamsulosin (FLOMAX) 0.4 MG CAPS capsule Take 0.4 mg by mouth daily. Patient not taking: Reported on 08/13/2022    [provider]    Physical Exam: Vitals:   08/30/22 2043 08/30/22 2148 08/30/22 2328 08/31/22 0111  BP: (!) 104/57 114/68 (!) 97/53 (!) 106/52  Pulse:  (!) 57  (!) 58  Resp:  '18 18 18  '$ Temp:  97.7 F (36.5 C)  97.6 F (36.4 C)  TempSrc:  Oral  Oral  SpO2:  100%  100%   Heent: anicteric, tongue midline, tongue bone dry Neck: no jvd Heart: rrr s1, s2  Lung: CTAB Abd: soft, obese, nt, nd, +bs Ext: no c/c/e Skin : no rash, no palmar erythema, no asterixis Neuro: nonfocal, able to move all 4 ext, reflexes 2+ symmetric, diffuse with no clonus  Data  Reviewed:   Assessment and Plan: Hypotension Check trop Check cortisol Pt received iv ns for hydration Check cardiac echo  Low Back pain  Check MRI L spine  Anemia, Thrombocytopenia Check cbc in am  Dm2 HOld Metformin Hold Jardiance Fsbs ac and qhs, ISS  Hypertension Cont current medication, unclear what this is due to patient is poor historian CHF p EF Cont Lasix  Abnormal liver function Check MRI abdomen r/o hepatocellular carcinoma Check alpha feta protein  Acute lower UTI Urine culture pending Rocephin 1gm iv qday  DVT prophylaxis: lovenox FULL CODE Dispo: pt homeless  Pt will be admitted observation for hypotension of unclear etiology     Advance Care Planning:   Code Status: Prior FULL CODE  Consults: none  Family Communication: w patient  Severity of Illness: The appropriate patient status for this patient is OBSERVATION. Observation status is judged to be reasonable and necessary in order to provide the required intensity of service to ensure the patient's safety. The patient's presenting symptoms, physical exam findings, and initial radiographic and laboratory data in the context of their medical condition is felt to place them at decreased risk for further clinical deterioration. Furthermore, it is anticipated that the patient will be medically stable for discharge from the hospital within 2 midnights of admission.   Author: Jani Gravel, MD 08/31/2022 1:32 AM  For on call review [email or web address].

## 2022-09-01 ENCOUNTER — Observation Stay: Admit: 2022-09-01 | Payer: Medicare Other

## 2022-09-01 DIAGNOSIS — I959 Hypotension, unspecified: Secondary | ICD-10-CM | POA: Diagnosis not present

## 2022-09-01 DIAGNOSIS — I9589 Other hypotension: Secondary | ICD-10-CM | POA: Diagnosis not present

## 2022-09-01 LAB — CBC
HCT: 31 % — ABNORMAL LOW (ref 39.0–52.0)
Hemoglobin: 10.3 g/dL — ABNORMAL LOW (ref 13.0–17.0)
MCH: 31.2 pg (ref 26.0–34.0)
MCHC: 33.2 g/dL (ref 30.0–36.0)
MCV: 93.9 fL (ref 80.0–100.0)
Platelets: 63 10*3/uL — ABNORMAL LOW (ref 150–400)
RBC: 3.3 MIL/uL — ABNORMAL LOW (ref 4.22–5.81)
RDW: 17.6 % — ABNORMAL HIGH (ref 11.5–15.5)
WBC: 2 10*3/uL — ABNORMAL LOW (ref 4.0–10.5)
nRBC: 0 % (ref 0.0–0.2)

## 2022-09-01 LAB — BASIC METABOLIC PANEL
Anion gap: 5 (ref 5–15)
BUN: 22 mg/dL (ref 8–23)
CO2: 26 mmol/L (ref 22–32)
Calcium: 9 mg/dL (ref 8.9–10.3)
Chloride: 107 mmol/L (ref 98–111)
Creatinine, Ser: 0.91 mg/dL (ref 0.61–1.24)
GFR, Estimated: >60 mL/min (ref >60)
Glucose, Bld: 78 mg/dL (ref 70–99)
Potassium: 3.8 mmol/L (ref 3.5–5.1)
Sodium: 138 mmol/L (ref 135–145)

## 2022-09-01 LAB — GLUCOSE, CAPILLARY
Glucose-Capillary: 66 mg/dL — ABNORMAL LOW (ref 70–99)
Glucose-Capillary: 134 mg/dL — ABNORMAL HIGH (ref 70–99)
Glucose-Capillary: 151 mg/dL — ABNORMAL HIGH (ref 70–99)

## 2022-09-01 LAB — PHOSPHORUS: Phosphorus: 3.2 mg/dL (ref 2.5–4.6)

## 2022-09-01 LAB — AFP TUMOR MARKER: AFP, Serum, Tumor Marker: <1.8 ng/mL (ref 0.0–8.4)

## 2022-09-01 LAB — VITAMIN B12: Vitamin B-12: 522 pg/mL (ref 180–914)

## 2022-09-01 LAB — MAGNESIUM: Magnesium: 1.8 mg/dL (ref 1.7–2.4)

## 2022-09-01 LAB — AMMONIA: Ammonia: 33 umol/L (ref 9–35)

## 2022-09-01 LAB — VITAMIN D 25 HYDROXY (VIT D DEFICIENCY, FRACTURES): Vit D, 25-Hydroxy: 12.92 ng/mL — ABNORMAL LOW (ref 30–100)

## 2022-09-01 NOTE — Evaluation (Signed)
Occupational Therapy Evaluation Patient Details Name: [name] MRN: [id number] DOB: 07-22-1951 Today's Date: 09/01/2022   History of Present Illness Pt is a 72 year old male admitted with hypotension, bradycardia, UTI; PMH significant for hypertension, Dm2, CHF (diastolic), OSA , Asthma/ Copd cirrhosis   Clinical Impression   Chart reviewed, nurse cleared pt for participation in OT evaluation. Pt is known to therapy from previous admissions. Pt is alert and oriented x4,. Pt reports recent falls, is performing ADL/IADL at a MOD I level, reports he lives near a friends house in a tent, goes to the grocery store for groceries and performs bathing tasks [place]. Pt appears to be performing ADL at/close to baseline level. No OT needs identified at this time. Please re consult if there is a change in functional status.    Recommendations for follow up therapy are one component of a multi-disciplinary discharge planning process, led by the attending physician.  Recommendations may be updated based on patient status, additional functional criteria and insurance authorization.   Follow Up Recommendations  No OT follow up     Assistance Recommended at Discharge Intermittent Supervision/Assistance  Patient can return home with the following Direct supervision/assist for medications management    Functional Status Assessment  Patient has had a recent decline in their functional status and demonstrates the ability to make significant improvements in function in a reasonable and predictable amount of time.  Equipment Recommendations  None recommended by OT    Recommendations for Other Services       Precautions / Restrictions Precautions Precautions: Fall      Mobility Bed Mobility               General bed mobility comments: NT in recliner and seated at edge of bed pre/post session    Transfers Overall transfer level: Needs  assistance Equipment used: None Transfers: Sit to/from Stand Sit to Stand: Modified independent (Device/Increase time)                  Balance Overall balance assessment: Needs assistance Sitting-balance support: Feet supported       Standing balance support: No upper extremity supported Standing balance-Leahy Scale: Good                             ADL either performed or assessed with clinical judgement   ADL Overall ADL's : Needs assistance/impaired Eating/Feeding: Set up;Sitting   Grooming: Wash/dry hands;Modified independent;Standing           Upper Body Dressing : Modified independent Upper Body Dressing Details (indicate cue type and reason): anticipated Lower Body Dressing: Modified independent;Sit to/from stand Lower Body Dressing Details (indicate cue type and reason): doff/donn underwear in standing Toilet Transfer: Modified Independent;Ambulation   Toileting- Clothing Manipulation and Hygiene: Modified independent;Sit to/from stand       Functional mobility during ADLs: Supervision/safety (approx 100'in hallway)       Vision Patient Visual Report: No change from baseline       Perception     Praxis      Pertinent Vitals/Pain Pain Assessment Pain Assessment: Faces Faces Pain Scale: Hurts a little bit Pain Location: lower back- chronic Pain Descriptors / Indicators: Discomfort Pain Intervention(s): Monitored during session     Hand Dominance Right   Extremity/Trunk Assessment Upper Extremity Assessment Upper Extremity Assessment: Overall WFL for tasks assessed   Lower Extremity Assessment Lower  Extremity Assessment: Defer to PT evaluation   Cervical / Trunk Assessment Cervical / Trunk Assessment: Normal   Communication Communication Communication: No difficulties   Cognition Arousal/Alertness: Awake/alert Behavior During Therapy: WFL for tasks assessed/performed Overall Cognitive Status: Within Functional Limits  for tasks assessed Area of Impairment: Awareness, Problem solving                           Awareness: Emergent Problem Solving: Requires verbal cues, Requires tactile cues       General Comments  HR up to 67 with amb in hallway    Exercises Other Exercises Other Exercises: edu re; role of OT, role of rehab, discharge recommendations, safe ADL completion   Shoulder Instructions      Home Living Family/patient expects to be discharged to:: Shelter/Homeless                                 Additional Comments: pt reports he lives in a tent near his friends house, his step daughter and husband live in a tent near him      Prior Functioning/Environment Prior Level of Function : Independent/Modified Independent;History of Falls (last six months)             Mobility Comments: fall history ADLs Comments: pt reports MOD I with ADL/IADL, increased time/rest breaks due to LBP and dizziness        OT Problem List:        OT Treatment/Interventions:      OT Goals(Current goals can be found in the care plan section) Acute Rehab OT Goals Patient Stated Goal: return to previous living environment OT Goal Formulation: With patient Time For Goal Achievement: 09/15/22 Potential to Achieve Goals: Good  OT Frequency:      Co-evaluation              AM-PAC OT "6 Clicks" Daily Activity     Outcome Measure Help from another person eating meals?: None Help from another person taking care of personal grooming?: None Help from another person toileting, which includes using toliet, bedpan, or urinal?: None Help from another person bathing (including washing, rinsing, drying)?: None Help from another person to put on and taking off regular upper body clothing?: None Help from another person to put on and taking off regular lower body clothing?: None 6 Click Score: 24   End of Session Nurse Communication: Mobility status  Activity Tolerance: Patient  tolerated treatment well Patient left: Other (comment) (at edge of bed, all needs in reach; RN aware of pt status)                   Time: 8416-6063 OT Time Calculation (min): 12 min Charges:  OT General Charges $OT Visit: 1 Visit OT Evaluation $OT Eval Low Complexity: 1 Low  Shanon Payor, OTD OTR/L  09/01/22, 9:11 AM

## 2022-09-01 NOTE — Discharge Summary (Signed)
[facility] Discharge Summary   Patient: [name] OEH:[id number]  PCP: Denton Lank, MD  Date of admission: 08/30/2022   Date of discharge: 09/01/2022 09/02/2022     Discharge Diagnoses:   Principal Problem:   Hypotension Active Problems:   Thrombocytopenia ([place])   Anemia   HTN (hypertension)   Uncontrolled type 2 diabetes mellitus with hyperglycemia, without long-term current use of insulin (HCC)   COPD (chronic obstructive pulmonary disease) (HCC)   Abnormal LFTs   Protein-calorie malnutrition, severe ([place])   Admitted From: Homeless Disposition: Back to his tent, transportation was arranged by [facility]  Recommendations for Outpatient Follow-up:  PCP: in 1 week, monitor BP and skip the dose of antihypertensive medications as SBP less than 120 mmHg. repeat CT/MRI liver to rule out hepatocellular carcinoma and may need biopsy Follow-up with GI/hepatologist as an outpatient for management of liver cirrhosis, incidental finding of liver lesion, needs to be worked up as an outpatient, need to repeat CT or MRI and may need biopsy to rule out hepatocellular carcinoma. Follow-up with cardiology for bradycardia, hypertension and diastolic CHF. Follow up LABS/TEST:  as above   Diet recommendation: Cardiac diet and carb modified  Activity: The patient is advised to gradually reintroduce usual activities, as tolerated  Discharge Condition: stable  Code Status: Full code   History of present illness: As per the H and P dictated on admission Hospital Course:  [name] is a 72 y.o. male with medical history significant of hypertension, Dm2, CHF (diastolic), OSA , Asthma/ Copd cirrhosis apparently felt like he was having difficulty with balance and moving his extremities.  Patient was found to have hypotension and bradycardic.  Patient was given IV fluid bolus, BP improved.  In ED, T 97.9 P 62 Bp 86/42 pox 92% on RA. Wbc 5.2, Hgb 11.5, Plt 93 Ast 46, Alt 29, Alk phos 89, T. Bili  1.7, Trop 14, Lactic acid 1.3. Urinalysis wbc 6-10, rbc 21.50 CT brain: No acute intracranial abnormality. CTA chest: No evidence of pulmonary embolism. Mild to moderate severity bilateral lower lobe atelectasis. Stable nondisplaced fracture deformity involving the body of the sternum. Cardiomegaly with marked severity coronary artery calcification. Dilated main pulmonary artery which may represent pulmonary hypertension. Cirrhotic liver. Evidence of prior cholecystectomy.  Aortic atherosclerosis. CT abd/ pelvis: Cirrhosis with no definite evidence of portal hypertension. Poorly visualized hypodense 2.2 cm right peripheral posterior hepatic lobe mass. Query other finding along the hepatic dome that is poorly visualized. Findings suspicious for a hepatocellular carcinoma. Please note that liver protocol enhanced MR and CT are the most sensitive tests for the screening detection of hepatocellular carcinoma in the high risk setting of cirrhosis. When the patient is clinically stable and able to follow directions and hold their breath (preferably as an outpatient) further evaluation with dedicated outpatient MRI liver protocol should be considered.  2. A 6 mm right nephrolithiasis. 3. Enlarged common bile duct which can be seen in the post cholecystectomy setting. Pt was given ns iv hydration in the ED, and bp improved some.  Pt appears dry clinically.  Pt will be admitted for hypotension    Assessment and Plan:   # Hypotension resolved s/p IVF, trop negative x 2, cortisol 6.4 at lower end S/p midodrine 5 mg p.o. 3 times daily as needed, but patient's blood pressure remained stable did not need midodrine. # Bradycardia, monitored on telemetry, no significant events.  Ambulatory heart rate was above 60, and patient remained asymptomatic. Discussed with cardiology, recommended  no intervention, no need of pacemaker at this time. Follow-up as an outpatient. History of irregular rhythm, patient stated that he  received a shock and ablation in the past.  Patient was advised to follow-up with cardiology as an outpatient. # Low Back pain  MRI L spine: 1. No spinal canal stenosis. 2. Mild neural foraminal narrowing bilaterally at L3-L4 and L4-L5 and on the left at L5-S1. Narrowing of the lateral recesses at L3-L4 could affect the descending L4 nerve roots. Annular fissures at L3-L4 and L4-L5, which can be acutely painful.  Resumed home medications.  Patient was advised to follow with PCP. # Dm2, resumed home medications, continue diabetic diet and follow with PCP # Hypertension, Patient presented with hypotension, so lisinopril was held during hospital stay but blood pressure improved so patient was advised to continue lisinopril and monitor BP at home.  Follow with PCP. # CHF p EF, patient had bilateral lower extremity edema so started on IV Lasix 40 mg twice daily during hospital stay, resumed Lasix 20 mg home dose. # Liver cirrhosis and peripheral edema, s/p Lasix 40 mg IV twice daily, resumed home dose Lasix 20 mg on discharge. # Liver lesion, incidental finding, need to rule out hepatocellular carcinoma. Abnormal liver function MRI: 2.5 cm posterior right hepatic lobe lesion, iaging features are compatible with a LI-RADS 4 lesion, probably hepatocellular carcinoma. Consider repeat MRI after resolution of acute symptoms when patient is better able to participate in positioning and reproducible breath holding. Liver cirrhosis, portal hypertension, gastric wall thickening possibly gastritis, diffuse body wall edema. Check alpha feta protein Patient was advised to follow with PCP to repeat MRI liver as an outpatient and may need biopsy as an outpatient. # Acute lower UTI, s/p Rocephin 1gm iv qday given during hospital stay.  Patient remained asymptomatic, no more need of antibiotics. Body mass index is 35.33 kg/m.  Nutrition Interventions:      - Patient was instructed, not to drive, operate heavy  machinery, perform activities at heights, swimming or participation in water activities or provide baby sitting services while on Pain, Sleep and Anxiety Medications; until his outpatient Physician has advised to do so again.  - Also recommended to not to take more than prescribed Pain, Sleep and Anxiety Medications.  Patient was seen by physical therapy, who recommended no therapy needed on discharge On the day of the discharge the patient's vitals were stable, and no other acute medical condition were reported by patient. the patient was felt safe to be discharge at Home.  Consultants: None Procedures: None  Discharge Exam: General: Appear in no distress, no Rash; Oral Mucosa Clear, moist. Cardiovascular: S1 and S2 Present, no Murmur, Respiratory: normal respiratory effort, Bilateral Air entry present and no Crackles, no wheezes Abdomen: Bowel Sound present, Soft and no tenderness, no hernia Extremities: mild  Pedal edema, no calf tenderness Neurology: alert and oriented to time, place, and person affect appropriate.  Filed Weights   08/31/22 0630 09/01/22 0500  Weight: 112 kg 99.3 kg   Vitals:   09/01/22 0859 09/01/22 1202  BP: 114/65 (!) 108/46  Pulse: (!) 53 (!) 57  Resp:  16  Temp: 97.9 F (36.6 C)   SpO2: 100% 100%    DISCHARGE MEDICATION: Allergies as of 09/01/2022       Reactions   Bee Venom Anaphylaxis   Codeine Itching   Only itching. Recently allergy tested at [facility]   Eggs Or Egg-derived Products Nausea And Vomiting   Novocain [procaine]  Sulfa Antibiotics Other (See Comments)        Medication List     STOP taking these medications    CapsFenac Pak 1.5 & 0.025 % Thpk Generic drug: Diclofenac Sodium & Capsaicin       TAKE these medications    albuterol (2.5 MG/3ML) 0.083% nebulizer solution Commonly known as: PROVENTIL Take 3 mLs by nebulization every 4 (four) hours as needed for wheezing or shortness of breath.   albuterol 108 (90 Base)  MCG/ACT inhaler Commonly known as: VENTOLIN HFA Inhale 2 puffs into the lungs every 6 (six) hours as needed for wheezing or shortness of breath.   ascorbic acid 500 MG tablet Commonly known as: VITAMIN C Take 1 tablet (500 mg total) by mouth daily.   aspirin EC 81 MG tablet Take 1 tablet (81 mg total) by mouth daily. Swallow whole.   atorvastatin 40 MG tablet Commonly known as: LIPITOR Take 1 tablet (40 mg total) by mouth daily.   buprenorphine-naloxone 2-0.5 mg Subl SL tablet Commonly known as: SUBOXONE Place 1 tablet under the tongue in the morning and at bedtime. [name]   buprenorphine-naloxone 8-2 mg Subl SL tablet Commonly known as: SUBOXONE Place 1 tablet under the tongue in the morning and at bedtime.   cyclobenzaprine 5 MG tablet Commonly known as: FLEXERIL Take 5 mg by mouth at bedtime as needed.   EPINEPHrine 0.3 mg/0.3 mL Soaj injection Commonly known as: EPI-PEN Inject 0.3 mg into the muscle as needed.   fluticasone 50 MCG/ACT nasal spray Commonly known as: FLONASE Place 1 spray into both nostrils daily.   furosemide 20 MG tablet Commonly known as: LASIX Take 20 mg by mouth every morning. What changed: Another medication with the same name was removed. Continue taking this medication, and follow the directions you see here.   glucosamine-chondroitin 500-400 MG tablet Take 1 tablet by mouth 3 (three) times daily.   hydrOXYzine 10 MG tablet Commonly known as: ATARAX Take 10 mg by mouth 3 (three) times daily as needed.   Jardiance 10 MG Tabs tablet Generic drug: empagliflozin Take 1 tablet (10 mg total) by mouth daily.   lansoprazole 30 MG capsule Commonly known as: PREVACID Take 30 mg by mouth daily.   lisinopril 40 MG tablet Commonly known as: ZESTRIL Take 1 tablet (40 mg total) by mouth daily.   metFORMIN 500 MG 24 hr tablet Commonly known as: GLUCOPHAGE-XR Take 1 tablet (500 mg total) by mouth daily.   multivitamin with minerals Tabs  tablet Take 1 tablet by mouth daily.   nitroGLYCERIN 0.4 MG SL tablet Commonly known as: NITROSTAT Place 1 tablet (0.4 mg total) under the tongue every 5 (five) minutes x 3 doses as needed for chest pain.   ondansetron 4 MG disintegrating tablet Commonly known as: ZOFRAN-ODT Take 1 tablet (4 mg total) by mouth every 6 (six) hours as needed for nausea or vomiting.   ondansetron 4 MG disintegrating tablet Commonly known as: ZOFRAN-ODT Take 1 tablet (4 mg total) by mouth every 8 (eight) hours as needed.   oxyCODONE-acetaminophen 5-325 MG tablet Commonly known as: Percocet Take 1 tablet by mouth every 4 (four) hours as needed for severe pain.   PARoxetine 10 MG tablet Commonly known as: PAXIL Take 1 tablet (10 mg total) by mouth daily.   tamsulosin 0.4 MG Caps capsule Commonly known as: FLOMAX Take 0.4 mg by mouth daily.       Allergies  Allergen Reactions   Bee Venom Anaphylaxis  Codeine Itching    Only itching. Recently allergy tested at [facility]   Eggs Or Egg-Derived Products Nausea And Vomiting   Novocain [Procaine]    Sulfa Antibiotics Other (See Comments)   Discharge Instructions     Call MD for:  difficulty breathing, headache or visual disturbances   Complete by: As directed    Call MD for:  extreme fatigue   Complete by: As directed    Call MD for:  persistant dizziness or light-headedness   Complete by: As directed    Call MD for:  severe uncontrolled pain   Complete by: As directed    Call MD for:  temperature >100.4   Complete by: As directed    Diet - low sodium heart healthy   Complete by: As directed    Discharge instructions   Complete by: As directed    Follow-up with PCP in 1 week, monitor BP and skip the dose of antihypertensive medications as SBP less than 120 mmHg. repeat CT/MRI liver to rule out hepatocellular carcinoma and may need biopsy Follow-up with GI/hepatologist as an outpatient for management of liver cirrhosis, incidental finding of  liver lesion, needs to be worked up as an outpatient, need to repeat CT or MRI and may need biopsy to rule out hepatocellular carcinoma. Follow-up with cardiology for bradycardia, hypertension and diastolic CHF.   Increase activity slowly   Complete by: As directed        The results of significant diagnostics from this hospitalization (including imaging, microbiology, ancillary and laboratory) are listed below for reference.    Significant Diagnostic Studies: MR ABDOMEN W WO CONTRAST  Result Date: 08/31/2022 CLINICAL DATA:  Cirrhotic liver with liver lesions seen on recent CT scan. EXAM: MRI ABDOMEN WITHOUT AND WITH CONTRAST TECHNIQUE: Multiplanar multisequence MR imaging of the abdomen was performed both before and after the administration of intravenous contrast. CONTRAST:  57m GADAVIST GADOBUTROL 1 MMOL/ML IV SOLN COMPARISON:  CT scan 08/30/2022 FINDINGS: Lower chest: Subsegmental atelectasis noted in the lung bases. Hepatobiliary: Markedly motion degraded imaging of the liver. Small or subtle lesions could be obscured. Nodular liver parenchyma consistent with cirrhosis. 2.5 cm posterior right hepatic lobe lesion seen on yesterday's CT imaging is subtle by MRI but visible on pre contrast T1 image 35 of series 12. The marked motion artifact hinders qualitative assessment and accurate measurement. This lesion appears isointense to liver parenchyma on T2 imaging and shows intrinsic T1 shortening on precontrast T1 imaging. There may be some minimal enhancement on arterial phase imaging the becomes isointense to liver parenchyma on more delayed postcontrast imaging. No clear washout and no enhancing rim. Lesion does demonstrate restricted diffusion (see image 113/series 7). Gallbladder is surgically absent. Common duct measures up to 15 mm diameter. No evidence for choledocholithiasis. Pancreas: No focal mass lesion. No dilatation of the main duct. No intraparenchymal cyst. No peripancreatic edema.  Although assessment is limited by motion. Spleen:  No gross abnormality within the spleen. Adrenals/Urinary Tract: No adrenal nodule or mass. Kidneys unremarkable. Stomach/Bowel: Stomach is nondistended. There appears to be diffuse gastric wall thickening which may be related to underdistention although gastritis not excluded. No small bowel or colonic dilatation within the visualized abdomen. Vascular/Lymphatic: No abdominal aortic aneurysm. No abdominal lymphadenopathy. Portal vein, superior mesenteric vein, and splenic vein are patent. Marked enlargement/recanalization of the paraumbilical vein evident. Other:  No substantial ascites. Musculoskeletal: Diffuse body wall edema evident. No focal suspicious marrow enhancement within the visualized bony anatomy. IMPRESSION: 1. Markedly motion degraded  imaging of the liver. 2.5 cm posterior right hepatic lobe lesion seen on yesterday's CT imaging is partially obscured by motion. There may be some minimal enhancement on arterial phase imaging that becomes isointense to liver parenchyma on more delayed postcontrast imaging. Although there is no washout or enhancing rim, the lesion does demonstrate restricted diffusion. Imaging features are compatible with a LI-RADS 4 lesion, probably hepatocellular carcinoma. Consider repeat MRI after resolution of acute symptoms when patient is better able to participate in positioning and reproducible breath holding. 2. Nodular liver parenchyma consistent with cirrhosis. 3. Marked enlargement/recanalization of the paraumbilical vein consistent with portal venous hypertension. 4. Diffuse gastric wall thickening may be related to underdistention although other etiology such as gastritis not excluded. 5. Diffuse body wall edema. Electronically Signed   By: Misty Stanley M.D.   On: 08/31/2022 05:26   MR LUMBAR SPINE WO CONTRAST  Result Date: 08/31/2022 CLINICAL DATA:  Weakness EXAM: MRI LUMBAR SPINE WITHOUT CONTRAST TECHNIQUE:  Multiplanar, multisequence MR imaging of the lumbar spine was performed. No intravenous contrast was administered. COMPARISON:  No prior MRI of the lumbar spine available FINDINGS: Segmentation:  5 lumbar type vertebral bodies. Alignment: No listhesis. Mild dextrocurvature. Preservation of the normal lumbar lordosis. Vertebrae:  No fracture, evidence of discitis, or bone lesion. Conus medullaris and cauda equina: Conus extends to the L2 level. Conus and cauda equina appear normal. Paraspinal and other soft tissues: Negative. Disc levels: T12-L1: No significant disc bulge. No spinal canal stenosis or neural foraminal narrowing. L1-L2: No significant disc bulge. No spinal canal stenosis or neural foraminal narrowing. L2-L3: No significant disc bulge. No spinal canal stenosis or neural foraminal narrowing. L3-L4: Mild disc bulge with small central protrusion with annular fissure. Mild facet arthropathy. Narrowing of the lateral recesses. No spinal canal stenosis. Mild bilateral neural foraminal narrowing. L4-L5: Minimal disc bulge with central annular fissure. Mild-to-moderate facet arthropathy. No spinal canal stenosis. Mild bilateral neural foraminal narrowing. L5-S1: No significant disc bulge. Mild facet arthropathy. No spinal canal stenosis. Mild left neural foraminal narrowing. IMPRESSION: 1. No spinal canal stenosis. 2. Mild neural foraminal narrowing bilaterally at L3-L4 and L4-L5 and on the left at L5-S1. 3. Narrowing of the lateral recesses at L3-L4 could affect the descending L4 nerve roots. 4. Annular fissures at L3-L4 and L4-L5, which can be acutely painful. Electronically Signed   By: Merilyn Baba M.D.   On: 08/31/2022 03:41   CT HEAD WO CONTRAST (5MM)  Result Date: 08/31/2022 CLINICAL DATA:  Mental status change, unknown cause EXAM: CT HEAD WITHOUT CONTRAST TECHNIQUE: Contiguous axial images were obtained from the base of the skull through the vertex without intravenous contrast. RADIATION DOSE  REDUCTION: This exam was performed according to the departmental dose-optimization program which includes automated exposure control, adjustment of the mA and/or kV according to patient size and/or use of iterative reconstruction technique. COMPARISON:  CT head 08/12/2022 FINDINGS: Brain: No evidence of large-territorial acute infarction. No parenchymal hemorrhage. No mass lesion. No extra-axial collection. No mass effect or midline shift. No hydrocephalus. Basilar cisterns are patent. Vascular: No hyperdense vessel. Skull: No acute fracture or focal lesion. Sinuses/Orbits: Paranasal sinuses and mastoid air cells are clear. The orbits are unremarkable. Other: None. IMPRESSION: No acute intracranial abnormality. Electronically Signed   By: Iven Finn M.D.   On: 08/31/2022 00:45   CT ABDOMEN PELVIS W CONTRAST  Result Date: 08/30/2022 CLINICAL DATA:  Abdominal pain, acute, nonlocalized EXAM: CT ABDOMEN AND PELVIS WITH CONTRAST TECHNIQUE: Multidetector CT imaging of  the abdomen and pelvis was performed using the standard protocol following bolus administration of intravenous contrast. RADIATION DOSE REDUCTION: This exam was performed according to the departmental dose-optimization program which includes automated exposure control, adjustment of the mA and/or kV according to patient size and/or use of iterative reconstruction technique. CONTRAST:  134m OMNIPAQUE IOHEXOL 350 MG/ML SOLN COMPARISON:  CT chest 08/12/2022 FINDINGS: Lower chest: No acute abnormality. At least two vessel coronary calcification. Hepatobiliary: Nodular hepatic contour. Redemonstration of a poorly visualized hypodense 2.2 cm right peripheral posterior hepatic lobe mass (4:22). Query other finding along the hepatic dome that is poorly visualized (4:17). Status post cholecystectomy. No intrahepatic biliary dilatation. Common bile duct enlarged measuring up to 1.7 cm which can be seen in the setting of post cholecystectomy setting. Pancreas:  No focal lesion. Normal pancreatic contour. No surrounding inflammatory changes. No main pancreatic ductal dilatation. Spleen: Normal in size without focal abnormality. Splenules are noted. Adrenals/Urinary Tract: No adrenal nodule bilaterally. Bilateral kidneys enhance symmetrically. Right kidney nephrolithiasis measuring up to 6 mm. No left nephrolithiasis. No hydronephrosis. No hydroureter. The urinary bladder is unremarkable. Stomach/Bowel: Stomach is within normal limits. No evidence of bowel wall thickening or dilatation. Appendix appears normal. Vascular/Lymphatic: The main portal, splenic, superior mesenteric veins are patent. No abdominal aorta or iliac aneurysm. Mild atherosclerotic plaque of the aorta and its branches. No abdominal, pelvic, or inguinal lymphadenopathy. Reproductive: Prostate is unremarkable. Other: No intraperitoneal free fluid. No intraperitoneal free gas. No organized fluid collection. Musculoskeletal: No abdominal wall hernia or abnormality. No suspicious lytic or blastic osseous lesions. No acute displaced fracture. Multilevel degenerative changes of the spine. IMPRESSION: 1. Cirrhosis with no definite evidence of portal hypertension. Poorly visualized hypodense 2.2 cm right peripheral posterior hepatic lobe mass. Query other finding along the hepatic dome that is poorly visualized. Findings suspicious for a hepatocellular carcinoma. Please note that liver protocol enhanced MR and CT are the most sensitive tests for the screening detection of hepatocellular carcinoma in the high risk setting of cirrhosis. When the patient is clinically stable and able to follow directions and hold their breath (preferably as an outpatient) further evaluation with dedicated outpatient MRI liver protocol should be considered. 2. A 6 mm right nephrolithiasis. 3. Enlarged common bile duct which can be seen in the post cholecystectomy setting. Electronically Signed   By: MIven FinnM.D.   On:  08/30/2022 20:50   CT Angio Chest PE W and/or Wo Contrast  Result Date: 08/30/2022 CLINICAL DATA:  Weakness. EXAM: CT ANGIOGRAPHY CHEST WITH CONTRAST TECHNIQUE: Multidetector CT imaging of the chest was performed using the standard protocol during bolus administration of intravenous contrast. Multiplanar CT image reconstructions and MIPs were obtained to evaluate the vascular anatomy. RADIATION DOSE REDUCTION: This exam was performed according to the departmental dose-optimization program which includes automated exposure control, adjustment of the mA and/or kV according to patient size and/or use of iterative reconstruction technique. CONTRAST:  1022mOMNIPAQUE IOHEXOL 350 MG/ML SOLN COMPARISON:  August 12, 2022 FINDINGS: Cardiovascular: There is mild calcification of the aortic arch, without evidence of aortic aneurysm. Satisfactory opacification of the pulmonary arteries to the segmental level. No evidence of pulmonary embolism. The main pulmonary artery is dilated and measures 4.4 cm. The cardiac silhouette is mildly enlarged with marked severity coronary artery calcification. No pericardial effusion. Mediastinum/Nodes: No enlarged mediastinal, hilar, or axillary lymph nodes. Thyroid gland, trachea, and esophagus demonstrate no significant findings. Lungs/Pleura: Mild to moderate severity atelectasis is seen along the posterior aspect of the  bilateral lower lobes. There is no evidence of a pleural effusion or pneumothorax. Upper Abdomen: The liver is cirrhotic in appearance. Radiopaque surgical clips are seen within the gallbladder fossa. Musculoskeletal: A stable nondisplaced fracture deformity is seen involving the body of the sternum. Multilevel degenerative changes are seen throughout the thoracic spine. Review of the MIP images confirms the above findings. IMPRESSION: 1. No evidence of pulmonary embolism. 2. Mild to moderate severity bilateral lower lobe atelectasis. 3. Stable nondisplaced fracture  deformity involving the body of the sternum. 4. Cardiomegaly with marked severity coronary artery calcification. 5. Dilated main pulmonary artery which may represent pulmonary hypertension. 6. Cirrhotic liver. 7. Evidence of prior cholecystectomy. 8. Aortic atherosclerosis. Aortic Atherosclerosis (ICD10-I70.0). Electronically Signed   By: Virgina Norfolk M.D.   On: 08/30/2022 20:46   DG Chest 1 View  Result Date: 08/30/2022 CLINICAL DATA:  Shortness of breath, weakness EXAM: CHEST  1 VIEW COMPARISON:  08/12/2022 FINDINGS: Cardiomegaly. Both lungs are clear. The visualized skeletal structures are unremarkable. IMPRESSION: Cardiomegaly without acute abnormality of the lungs in AP portable projection. Electronically Signed   By: Delanna Ahmadi M.D.   On: 08/30/2022 19:17   CT L-SPINE NO CHARGE  Result Date: 08/13/2022 CLINICAL DATA:  Fall EXAM: CT Thoracic and Lumbar spine with contrast TECHNIQUE: Multiplanar CT images of the thoracic and lumbar spine were reconstructed from contemporary CT of the Chest, Abdomen, and Pelvis. RADIATION DOSE REDUCTION: This exam was performed according to the departmental dose-optimization program which includes automated exposure control, adjustment of the mA and/or kV according to patient size and/or use of iterative reconstruction technique. CONTRAST:  No additional COMPARISON:  Concurrent CT chest abdomen pelvis FINDINGS: CT THORACIC SPINE FINDINGS Alignment: Normal thoracic kyphosis. Vertebrae: No acute fracture or focal pathologic process. Paraspinal and other soft tissues: Evaluated on dedicated CT chest. Disc levels: Degenerative changes of the mid/lower thoracic spine. Spinal canal is patent. CT LUMBAR SPINE FINDINGS Segmentation: 5 lumbar type vertebral bodies. Alignment: Normal lumbar lordosis. Vertebrae: No acute fracture or focal pathologic process. Paraspinal and other soft tissues: Evaluated on dedicated CT abdomen/pelvis. Disc levels: Mild degenerative changes of  the mid lumbar spine. IMPRESSION: No traumatic injury to the thoracolumbar spine. Mild multilevel degenerative changes. Electronically Signed   By: Julian Hy M.D.   On: 08/13/2022 00:02   CT T-SPINE NO CHARGE  Result Date: 08/13/2022 CLINICAL DATA:  Fall EXAM: CT Thoracic and Lumbar spine with contrast TECHNIQUE: Multiplanar CT images of the thoracic and lumbar spine were reconstructed from contemporary CT of the Chest, Abdomen, and Pelvis. RADIATION DOSE REDUCTION: This exam was performed according to the departmental dose-optimization program which includes automated exposure control, adjustment of the mA and/or kV according to patient size and/or use of iterative reconstruction technique. CONTRAST:  No additional COMPARISON:  Concurrent CT chest abdomen pelvis FINDINGS: CT THORACIC SPINE FINDINGS Alignment: Normal thoracic kyphosis. Vertebrae: No acute fracture or focal pathologic process. Paraspinal and other soft tissues: Evaluated on dedicated CT chest. Disc levels: Degenerative changes of the mid/lower thoracic spine. Spinal canal is patent. CT LUMBAR SPINE FINDINGS Segmentation: 5 lumbar type vertebral bodies. Alignment: Normal lumbar lordosis. Vertebrae: No acute fracture or focal pathologic process. Paraspinal and other soft tissues: Evaluated on dedicated CT abdomen/pelvis. Disc levels: Mild degenerative changes of the mid lumbar spine. IMPRESSION: No traumatic injury to the thoracolumbar spine. Mild multilevel degenerative changes. Electronically Signed   By: Julian Hy M.D.   On: 08/13/2022 00:02   CT CHEST ABDOMEN PELVIS W CONTRAST  Result Date: 08/13/2022 CLINICAL DATA:  Fall, cirrhosis, hepatitis C, history of sternal fracture EXAM: CT CHEST, ABDOMEN, AND PELVIS WITH CONTRAST TECHNIQUE: Multidetector CT imaging of the chest, abdomen and pelvis was performed following the standard protocol during bolus administration of intravenous contrast. RADIATION DOSE REDUCTION: This exam  was performed according to the departmental dose-optimization program which includes automated exposure control, adjustment of the mA and/or kV according to patient size and/or use of iterative reconstruction technique. CONTRAST:  165m OMNIPAQUE IOHEXOL 300 MG/ML  SOLN COMPARISON:  CT chest dated 08/09/2022. CT chest abdomen pelvis dated 07/14/2022. FINDINGS: CT CHEST FINDINGS Cardiovascular: No evidence of traumatic aortic injury. Mild atherosclerotic calcifications of the aortic arch. The heart is normal in size.  No pericardial effusion. Enlargement the main pulmonary artery, suggesting pulmonary arterial hypertension. Coronary atherosclerosis of the LAD. Mediastinum/Nodes: No evidence of anterior mediastinal hematoma. No suspicious mediastinal lymphadenopathy. Visualized thyroid is unremarkable. Lungs/Pleura: Trace bilateral pleural effusions, right greater than left. This is improved on the left from recent CT, progressive on the right. Mild bibasilar atelectasis. No focal consolidation or aspiration. No suspicious pulmonary nodules. No pneumothorax. Musculoskeletal: Subacute/healing sternal fracture (sagittal image 72), unchanged from recent CT. No acute fracture is seen. Dedicated thoracic spine evaluation has been performed and will be reported separately. CT ABDOMEN PELVIS FINDINGS Hepatobiliary: Cirrhosis. Subtle enhancing lesion with associated capsular irregularity along the posterior right hepatic dome (segment 7), measuring up to 2.7 cm (series 2/image 49), suspicious for HCC. Status post cholecystectomy. No intrahepatic ductal dilatation. Dilated common duct, measuring up to 2.5 cm, tapering towards the ampulla, likely postsurgical. Pancreas: Within normal limits. Spleen: Within normal limits. Adrenals/Urinary Tract: Adrenal glands are within normal limits. Bilateral renal cortical scarring. Two nonobstructing right lower pole renal calculi measuring up to 6 mm (series 2/image 70). No hydronephrosis.  Bladder is within normal limits. Stomach/Bowel: Stomach is within normal limits. No evidence of bowel obstruction. Appendix is not discretely visualized. No colonic wall thickening or inflammatory changes. Vascular/Lymphatic: No evidence of abdominal aortic aneurysm. Recanalized periumbilical vein with varices beneath the right lower anterior abdominal wall. Portal vein is patent. No suspicious abdominopelvic lymphadenopathy Reproductive: Prostate is unremarkable. Other: Trace perihepatic ascites. Musculoskeletal: No fracture is seen. Dedicated lumbar spine evaluation has been performed and will be reported separately. IMPRESSION: No evidence of acute traumatic injury to the chest, abdomen, or pelvis. Subacute/healing sternal fracture, unchanged from recent CT. Cirrhosis. Subtle 2.7 cm enhancing lesion along the posterior aspect of segment 7, suspicious for HCC. Additional ancillary findings as above. Dedicated thoracolumbar spine evaluation has been performed and will be reported separately. Electronically Signed   By: SJulian HyM.D.   On: 08/13/2022 00:00   CT Head Wo Contrast  Result Date: 08/12/2022 CLINICAL DATA:  Trauma EXAM: CT HEAD WITHOUT CONTRAST CT CERVICAL SPINE WITHOUT CONTRAST TECHNIQUE: Multidetector CT imaging of the head and cervical spine was performed following the standard protocol without intravenous contrast. Multiplanar CT image reconstructions of the cervical spine were also generated. RADIATION DOSE REDUCTION: This exam was performed according to the departmental dose-optimization program which includes automated exposure control, adjustment of the mA and/or kV according to patient size and/or use of iterative reconstruction technique. COMPARISON:  Head CT 08/09/2022.  MRI brain 04/28/2022. FINDINGS: CT HEAD FINDINGS Brain: No evidence of acute infarction, hemorrhage, hydrocephalus, extra-axial collection or mass lesion/mass effect. Vascular: No hyperdense vessel or unexpected  calcification. Skull: Normal. Negative for fracture or focal lesion. Sinuses/Orbits: No acute finding. Other: None. CT CERVICAL SPINE FINDINGS  Alignment: Normal. Skull base and vertebrae: No acute fracture. No primary bone lesion or focal pathologic process. Soft tissues and spinal canal: No prevertebral fluid or swelling. No visible canal hematoma. Disc levels: There is moderate disc space narrowing and endplate osteophyte formation throughout the cervical spine compatible with degenerative change. There is moderate bilateral neural foraminal stenosis at C3-C4, C4-C5 and C5-C6 secondary to uncovertebral spurring. There is moderate central canal stenosis at C4-C5, C5-C6 and C6-C7 secondary to disc osteophyte complexes. Upper chest: Negative. Other: None. IMPRESSION: 1. No acute intracranial process. 2. No acute fracture or traumatic subluxation of the cervical spine. 3. Moderate degenerative changes of the cervical spine. Electronically Signed   By: Ronney Asters M.D.   On: 08/12/2022 18:10   CT Cervical Spine Wo Contrast  Result Date: 08/12/2022 CLINICAL DATA:  Trauma EXAM: CT HEAD WITHOUT CONTRAST CT CERVICAL SPINE WITHOUT CONTRAST TECHNIQUE: Multidetector CT imaging of the head and cervical spine was performed following the standard protocol without intravenous contrast. Multiplanar CT image reconstructions of the cervical spine were also generated. RADIATION DOSE REDUCTION: This exam was performed according to the departmental dose-optimization program which includes automated exposure control, adjustment of the mA and/or kV according to patient size and/or use of iterative reconstruction technique. COMPARISON:  Head CT 08/09/2022.  MRI brain 04/28/2022. FINDINGS: CT HEAD FINDINGS Brain: No evidence of acute infarction, hemorrhage, hydrocephalus, extra-axial collection or mass lesion/mass effect. Vascular: No hyperdense vessel or unexpected calcification. Skull: Normal. Negative for fracture or focal  lesion. Sinuses/Orbits: No acute finding. Other: None. CT CERVICAL SPINE FINDINGS Alignment: Normal. Skull base and vertebrae: No acute fracture. No primary bone lesion or focal pathologic process. Soft tissues and spinal canal: No prevertebral fluid or swelling. No visible canal hematoma. Disc levels: There is moderate disc space narrowing and endplate osteophyte formation throughout the cervical spine compatible with degenerative change. There is moderate bilateral neural foraminal stenosis at C3-C4, C4-C5 and C5-C6 secondary to uncovertebral spurring. There is moderate central canal stenosis at C4-C5, C5-C6 and C6-C7 secondary to disc osteophyte complexes. Upper chest: Negative. Other: None. IMPRESSION: 1. No acute intracranial process. 2. No acute fracture or traumatic subluxation of the cervical spine. 3. Moderate degenerative changes of the cervical spine. Electronically Signed   By: Ronney Asters M.D.   On: 08/12/2022 18:10   DG Chest 2 View  Result Date: 08/12/2022 CLINICAL DATA:  Multiple falls EXAM: CHEST - 2 VIEW COMPARISON:  Chest CT August 09, 2022 chest x-ray August 09, 2022 FINDINGS: Unchanged cardiomegaly and mediastinal contours. No focal pulmonary opacity. No pleural effusion or pneumothorax. Surgical clips visualized in the right upper quadrant. No acute osseous abnormality. IMPRESSION: No acute cardiopulmonary abnormality. Electronically Signed   By: Beryle Flock M.D.   On: 08/12/2022 17:41   CT Chest W Contrast  Result Date: 08/09/2022 CLINICAL DATA:  Chest trauma, blunt EXAM: CT CHEST WITH CONTRAST TECHNIQUE: Multidetector CT imaging of the chest was performed during intravenous contrast administration. RADIATION DOSE REDUCTION: This exam was performed according to the departmental dose-optimization program which includes automated exposure control, adjustment of the mA and/or kV according to patient size and/or use of iterative reconstruction technique. CONTRAST:  54m  OMNIPAQUE IOHEXOL 300 MG/ML  SOLN COMPARISON:  Chest CT 07/14/2022 FINDINGS: Cardiovascular: Normal cardiac size.No pericardial disease.Enlarged main pulmonary artery.No evidence of central PE. Mild atherosclerosis of the thoracic aorta. Mediastinum/Nodes: No lymphadenopathy.The thyroid is unremarkable. The trachea is unremarkable. Lungs/Pleura: No focal airspace consolidation.No suspicious pulmonary nodules.No pneumothorax. Left basilar subsegmental atelectasis.  Trace left pleural effusion. Upper Abdomen: Cirrhotic liver morphology with widened fissures and nodular contours, incompletely evaluated on single phase exam. Recanalized umbilical vein. Trace perihepatic ascites. Right kidney nephrolithiasis in lower pole. Prior cholecystectomy. Marked dilation of the common bile duct, unchanged from prior exams. Musculoskeletal: There is a subacute nondisplaced sternal fracture, with mild interval healing and unchanged alignment. There is no evidence of acute fracture.No suspicious lytic or blastic lesions. IMPRESSION: Subacute nondisplaced sternal fracture with mild interval healing and unchanged alignment in comparison to prior. No evidence of new acute trauma in the chest. Cirrhotic liver morphology with sequela of portal hypertension. Trace perihepatic ascites. Trace left pleural effusion.  Left basilar subsegmental atelectasis. Electronically Signed   By: Maurine Simmering M.D.   On: 08/09/2022 13:22   CT HEAD WO CONTRAST (5MM)  Result Date: 08/09/2022 CLINICAL DATA:  Head trauma, minor (Age >= 65y) EXAM: CT HEAD WITHOUT CONTRAST TECHNIQUE: Contiguous axial images were obtained from the base of the skull through the vertex without intravenous contrast. RADIATION DOSE REDUCTION: This exam was performed according to the departmental dose-optimization program which includes automated exposure control, adjustment of the mA and/or kV according to patient size and/or use of iterative reconstruction technique. COMPARISON:   04/28/2022 FINDINGS: Brain: No evidence of acute infarction, hemorrhage, hydrocephalus, extra-axial collection or mass lesion/mass effect. Vascular: No hyperdense vessel or unexpected calcification. Skull: Normal. Negative for fracture or focal lesion. Sinuses/Orbits: No acute finding. Other: None. IMPRESSION: No acute intracranial abnormality. Electronically Signed   By: Davina Poke D.O.   On: 08/09/2022 13:10   DG Chest 2 View  Result Date: 08/09/2022 CLINICAL DATA:  Possible broken ribs.  Falls at home EXAM: CHEST - 2 VIEW COMPARISON:  07/14/2022 chest CT and chest radiograph FINDINGS: Normal heart size. Stable aortic tortuosity. There is no edema, consolidation, effusion, or pneumothorax. No visible fracture. IMPRESSION: Stable exam.  No visible chest injury. Electronically Signed   By: Jorje Guild M.D.   On: 08/09/2022 11:28    Microbiology: Recent Results (from the past 240 hour(s))  Resp panel by RT-PCR (RSV, Flu A&B, Covid) Anterior Nasal Swab     Status: None   Collection Time: 08/30/22  7:00 PM   Specimen: Anterior Nasal Swab  Result Value Ref Range Status   SARS Coronavirus 2 by RT PCR NEGATIVE NEGATIVE Final    Comment: (NOTE) SARS-CoV-2 target nucleic acids are NOT DETECTED.  The SARS-CoV-2 RNA is generally detectable in upper respiratory specimens during the acute phase of infection. The lowest concentration of SARS-CoV-2 viral copies this assay can detect is 138 copies/mL. A negative result does not preclude SARS-Cov-2 infection and should not be used as the sole basis for treatment or other patient management decisions. A negative result may occur with  improper specimen collection/handling, submission of specimen other than nasopharyngeal swab, presence of viral mutation(s) within the areas targeted by this assay, and inadequate number of viral copies(<138 copies/mL). A negative result must be combined with clinical observations, patient history, and  epidemiological information. The expected result is Negative.  Fact Sheet for Patients:  EntrepreneurPulse.com.au  Fact Sheet for Healthcare Providers:  IncredibleEmployment.be  This test is no t yet approved or cleared by the [place] FDA and  has been authorized for detection and/or diagnosis of SARS-CoV-2 by FDA under an Emergency Use Authorization (EUA). This EUA will remain  in effect (meaning this test can be used) for the duration of the COVID-19 declaration under Section 564(b)(1) of the Act, 21 U.S.C.section  360bbb-3(b)(1), unless the authorization is terminated  or revoked sooner.       Influenza A by PCR NEGATIVE NEGATIVE Final   Influenza B by PCR NEGATIVE NEGATIVE Final    Comment: (NOTE) The Xpert Xpress SARS-CoV-2/FLU/RSV plus assay is intended as an aid in the diagnosis of influenza from Nasopharyngeal swab specimens and should not be used as a sole basis for treatment. Nasal washings and aspirates are unacceptable for Xpert Xpress SARS-CoV-2/FLU/RSV testing.  Fact Sheet for Patients: EntrepreneurPulse.com.au  Fact Sheet for Healthcare Providers: IncredibleEmployment.be  This test is not yet approved or cleared by the [place] FDA and has been authorized for detection and/or diagnosis of SARS-CoV-2 by FDA under an Emergency Use Authorization (EUA). This EUA will remain in effect (meaning this test can be used) for the duration of the COVID-19 declaration under Section 564(b)(1) of the Act, 21 U.S.C. section 360bbb-3(b)(1), unless the authorization is terminated or revoked.     Resp Syncytial Virus by PCR NEGATIVE NEGATIVE Final    Comment: (NOTE) Fact Sheet for Patients: EntrepreneurPulse.com.au  Fact Sheet for Healthcare Providers: IncredibleEmployment.be  This test is not yet approved or cleared by the [place] FDA and has been  authorized for detection and/or diagnosis of SARS-CoV-2 by FDA under an Emergency Use Authorization (EUA). This EUA will remain in effect (meaning this test can be used) for the duration of the COVID-19 declaration under Section 564(b)(1) of the Act, 21 U.S.C. section 360bbb-3(b)(1), unless the authorization is terminated or revoked.  Performed at [facility], [address] [place], [place] [id number]      Labs: CBC: Recent Labs  Lab 08/30/22 1708 08/31/22 0528 09/01/22 0609  WBC 5.2 3.1* 2.0*  HGB 11.5* 10.4* 10.3*  HCT 35.2* 32.1* 31.0*  MCV 94.4 95.8 93.9  PLT 93* 64* 63*   Basic Metabolic Panel: Recent Labs  Lab 08/30/22 1708 08/31/22 0528 09/01/22 0609  NA 138 137 138  K 3.5 3.8 3.8  CL 107 110 107  CO2 '23 25 26  '$ GLUCOSE 115* 83 78  BUN 19 24* 22  CREATININE 1.21 1.07 0.91  CALCIUM 8.9 8.7* 9.0  MG  --  2.1 1.8  PHOS  --  3.1 3.2   Liver Function Tests: Recent Labs  Lab 08/30/22 1708 08/31/22 0528  AST 46* 39  ALT 29 26  ALKPHOS 89 99  BILITOT 1.7* 1.4*  PROT 6.2* 6.0*  ALBUMIN 2.6* 2.4*   No results for input(s): "LIPASE", "AMYLASE" in the last 168 hours. Recent Labs  Lab 09/01/22 0609  AMMONIA 33   Cardiac Enzymes: No results for input(s): "CKTOTAL", "CKMB", "CKMBINDEX", "TROPONINI" in the last 168 hours. BNP (last 3 results) Recent Labs    04/26/22 0307 04/27/22 2035 05/10/22 2108  BNP 446.2* 376.8* 376.7*   CBG: Recent Labs  Lab 08/31/22 1725 08/31/22 2131 09/01/22 0902 09/01/22 1004 09/01/22 1203  GLUCAP 85 94 66* 134* 151*    Time spent: 35 minutes  Signed:  Val Riles  [facility] 09/01/2022  4:38 PM

## 2022-09-01 NOTE — Progress Notes (Signed)
Mobility Specialist - Progress Note   09/01/22 0837  Mobility  Activity Ambulated with assistance in hallway  Level of Assistance Standby assist, set-up cues, supervision of patient - no hands on  Assistive Device None  Distance Ambulated (ft) 120 ft  Activity Response Tolerated well  Mobility Referral Yes  $Mobility charge 1 Mobility   Pt OOB in bathroom on RA upon arrival. PT ambulates in hallway SBA with no LOB noted. Pt left in recliner with needs in reach and chair alarm set.   Gretchen Short  Mobility Specialist  09/01/22 8:39 AM

## 2022-09-01 NOTE — Progress Notes (Signed)
Hypoglycemic Event  CBG: 66  Treatment: 8 oz juice/soda  Symptoms: None  Follow-up CBG: Time:1000 CBG Result:134  Possible Reasons for Event: Inadequate meal intake  Comments/MD notified: Patient had not eaten breakfast. Patient has now consumed 100% of breakfast meal plus 8 oz orange juice. Patient has not exhibited any symptoms of hypoglycemia. Will continue to monitor.     Jerolyn Shin

## 2022-09-01 NOTE — Plan of Care (Signed)
Patient has remained alert, oriented, and able to voice needs during shift. Patient has been given PRN pain medication due to c/o chronic back pain and had an effective response to medication. Assistance with ADLs has been provided as needed. Call bell has remained within reach. Bed has remained in lowest position. Telemetry has been discontinued. Will continue to monitor.   Problem: Education: Goal: Ability to describe self-care measures that may prevent or decrease complications (Diabetes Survival Skills Education) will improve Outcome: Progressing Goal: Individualized Educational Video(s) Outcome: Progressing   Problem: Coping: Goal: Ability to adjust to condition or change in health will improve Outcome: Progressing   Problem: Fluid Volume: Goal: Ability to maintain a balanced intake and output will improve Outcome: Progressing   Problem: Health Behavior/Discharge Planning: Goal: Ability to identify and utilize available resources and services will improve Outcome: Progressing Goal: Ability to manage health-related needs will improve Outcome: Progressing   Problem: Metabolic: Goal: Ability to maintain appropriate glucose levels will improve Outcome: Progressing   Problem: Nutritional: Goal: Maintenance of adequate nutrition will improve Outcome: Progressing Goal: Progress toward achieving an optimal weight will improve Outcome: Progressing   Problem: Skin Integrity: Goal: Risk for impaired skin integrity will decrease Outcome: Progressing   Problem: Tissue Perfusion: Goal: Adequacy of tissue perfusion will improve Outcome: Progressing   Problem: Education: Goal: Knowledge of General Education information will improve Description: Including pain rating scale, medication(s)/side effects and non-pharmacologic comfort measures Outcome: Progressing   Problem: Health Behavior/Discharge Planning: Goal: Ability to manage health-related needs will improve Outcome:  Progressing   Problem: Clinical Measurements: Goal: Ability to maintain clinical measurements within normal limits will improve Outcome: Progressing Goal: Will remain free from infection Outcome: Progressing Goal: Diagnostic test results will improve Outcome: Progressing Goal: Respiratory complications will improve Outcome: Progressing Goal: Cardiovascular complication will be avoided Outcome: Progressing   Problem: Activity: Goal: Risk for activity intolerance will decrease Outcome: Progressing   Problem: Nutrition: Goal: Adequate nutrition will be maintained Outcome: Progressing   Problem: Coping: Goal: Level of anxiety will decrease Outcome: Progressing   Problem: Elimination: Goal: Will not experience complications related to bowel motility Outcome: Progressing Goal: Will not experience complications related to urinary retention Outcome: Progressing   Problem: Pain Managment: Goal: General experience of comfort will improve Outcome: Progressing   Problem: Safety: Goal: Ability to remain free from injury will improve Outcome: Progressing   Problem: Skin Integrity: Goal: Risk for impaired skin integrity will decrease Outcome: Progressing

## 2022-09-01 NOTE — Evaluation (Signed)
Physical Therapy Evaluation Patient Details Name: [name] MRN: [id number] DOB: 06-23-1951 Today's Date: 09/01/2022  History of Present Illness  Pt is a 72 year old male admitted with hypotension, bradycardia, UTI; PMH significant for hypertension, Dm2, CHF (diastolic), OSA , Asthma/ Copd cirrhosis. Chief complaint is weakness.  Clinical Impression  Patient  presents in room standing up upon arrival in no acute distress gathering clothes. He presents today with good overall strength and no LOB with mobility- just some unsteadiness with 180 deg turns. He presents at his baseline and comfortable returning home to his family and friends living in his tent. He presents with only mild unsteadiness and will benefit from acute PT services to improve his overall mobility to decrease his fall risk.      Recommendations for follow up therapy are one component of a multi-disciplinary discharge planning process, led by the attending physician.  Recommendations may be updated based on patient status, additional functional criteria and insurance authorization.  Follow Up Recommendations No PT follow up      Assistance Recommended at Discharge None  Patient can return home with the following  Assistance with cooking/housework;Assist for transportation;Help with stairs or ramp for entrance    Equipment Recommendations None recommended by PT  Recommendations for Other Services       Functional Status Assessment Patient has had a recent decline in their functional status and demonstrates the ability to make significant improvements in function in a reasonable and predictable amount of time.     Precautions / Restrictions Precautions Precautions: Fall      Mobility  Bed Mobility               General bed mobility comments: NT- Patient was standing up upon arrival and gather his clothes Patient Response: Cooperative  Transfers Overall transfer level: Needs assistance Equipment used:  None Transfers: Sit to/from Stand Sit to Stand: Modified independent (Device/Increase time)           General transfer comment: slow yet no equipment use of armrest    Ambulation/Gait Ambulation/Gait assistance: Modified independent (Device/Increase time) Gait Distance (Feet): 175 Feet Assistive device: None Gait Pattern/deviations: Decreased step length - left, Decreased step length - right   Gait velocity interpretation: <1.8 ft/sec, indicate of risk for recurrent falls   General Gait Details: Short reciprocal steps - mild unsteadiness with turn 180 deg yet no LOB  Stairs            Wheelchair Mobility    Modified Rankin (Stroke Patients Only)       Balance Overall balance assessment: Needs assistance Sitting-balance support: Feet supported Sitting balance-Leahy Scale: Good     Standing balance support: No upper extremity supported Standing balance-Leahy Scale: Good                               Pertinent Vitals/Pain Pain Assessment Pain Assessment: 0-10 Pain Score: 8  Pain Location: mid thoracic back pain- Pain Descriptors / Indicators: Aching Pain Intervention(s): Monitored during session, Repositioned    Home Living Family/patient expects to be discharged to:: Shelter/Homeless (Patient report living in a tent next door to home of a friend where he goes for bathing needs.) Living Arrangements: Non-relatives/Friends                 Additional Comments: pt reports he lives in a tent next door to his friends house, his step daughter and husband live in a  tent near him    Prior Function Prior Level of Function : Independent/Modified Independent;History of Falls (last six months)             Mobility Comments: fall history ADLs Comments: pt reports MOD I with ADL/IADL, increased time/rest breaks due to LBP and dizziness     Hand Dominance   Dominant Hand: Right    Extremity/Trunk Assessment   Upper Extremity  Assessment Upper Extremity Assessment: Defer to OT evaluation    Lower Extremity Assessment Lower Extremity Assessment: Overall WFL for tasks assessed    Cervical / Trunk Assessment Cervical / Trunk Assessment: Normal  Communication   Communication: No difficulties  Cognition Arousal/Alertness: Awake/alert Behavior During Therapy: WFL for tasks assessed/performed Overall Cognitive Status: Within Functional Limits for tasks assessed Area of Impairment: Awareness                           Awareness: Emergent Problem Solving: Requires verbal cues, Requires tactile cues          General Comments General comments (skin integrity, edema, etc.): HR at rest = 55 bpm; up to 65 bpm during and immediately after ambulation    Exercises     Assessment/Plan    PT Assessment Patient needs continued PT services  PT Problem List Decreased strength;Decreased mobility;Decreased coordination       PT Treatment Interventions Gait training;Stair training;Therapeutic exercise;Balance training;Therapeutic activities    PT Goals (Current goals can be found in the Care Plan section)  Acute Rehab PT Goals Patient Stated Goal: To go home PT Goal Formulation: With patient Time For Goal Achievement: 09/15/22 Potential to Achieve Goals: Good    Frequency Min 2X/week     Co-evaluation               AM-PAC PT "6 Clicks" Mobility  Outcome Measure Help needed turning from your back to your side while in a flat bed without using bedrails?: None Help needed moving from lying on your back to sitting on the side of a flat bed without using bedrails?: None Help needed moving to and from a bed to a chair (including a wheelchair)?: None Help needed standing up from a chair using your arms (e.g., wheelchair or bedside chair)?: A Little Help needed to walk in hospital room?: A Little Help needed climbing 3-5 steps with a railing? : A Little 6 Click Score: 21    End of Session  Equipment Utilized During Treatment: Gait belt Activity Tolerance: Patient tolerated treatment well;No increased pain Patient left: in chair Nurse Communication: Mobility status PT Visit Diagnosis: Difficulty in walking, not elsewhere classified (R26.2);Unsteadiness on feet (R26.81);Muscle weakness (generalized) (M62.81)    Time: 0100-7121 PT Time Calculation (min) (ACUTE ONLY): 18 min   Charges:   PT Evaluation $PT Eval Low Complexity: 1 Low          Ollen Bowl, PT  Lewis Moccasin 09/01/2022, 12:11 PM

## 2022-09-01 NOTE — TOC Transition Note (Addendum)
Transition of Care [facility]) - CM/SW Discharge Note   Patient Details  Name: [name] MRN: [id number] Date of Birth: 1951-02-05  [facility]) CM/SW Contact:  Elliot Gurney Reedy, [place] Phone Number: 09/01/2022, 12:46 PM   Clinical Narrative:    This Education officer, museum met with patient at bedside to discuss transportation needs for discharge. Per patient, he lives in a [place] in the woods with friends. Alternatives discussed, including the cold weather shelter through [employer]. Patient consistent with plan and chooses to return back to the tent. Patient confirms having a case Freight forwarder through the Department of Social Services who is currenlty working on identifying housing for patient, however no options have been been found to date. [employer] provided to patient as well for additional community resources related to housing, food and medical care.  Patient contact list reviewed with patient for transportation assistance. Per patient  Rosalita Chessman does not drive, DeDe Dixon did not answer. Patient provided with a taxi voucher home. Patient signed rider waiver and release of liability. Transport voucher faxed to [employer].  1:09pm Gilford Rile arranged to arrive at 2pm today for transport home.  Duran Ohern, LCSW Transition of Care      Barriers to Discharge: Continued Medical Work up   Patient Goals and CMS Choice      Discharge Placement                         Discharge Plan and Services Additional resources added to the After Visit Summary for                            [facility] Arranged: NA HH Agency: NA        Social Determinants of Health (SDOH) Interventions SDOH Screenings   Food Insecurity: No Food Insecurity (09/01/2022)  Housing: Low Risk  (09/01/2022)  Transportation Needs: No Transportation Needs (09/01/2022)  Utilities: Not At Risk (09/01/2022)  Alcohol Screen: Low Risk  (04/30/2018)  Depression (PHQ2-9): High Risk  (04/26/2022)  Financial Resource Strain: High Risk (04/27/2022)  Physical Activity: Sufficiently Active (04/16/2018)  Social Connections: Moderately Isolated (04/30/2018)  Stress: Stress Concern Present (04/16/2018)  Tobacco Use: Medium Risk (08/31/2022)     Readmission Risk Interventions     No data to display

## 2022-09-07 ENCOUNTER — Telehealth: Payer: Self-pay | Admitting: Family

## 2022-09-07 ENCOUNTER — Encounter: Payer: Medicare Other | Admitting: Family

## 2022-09-07 NOTE — Progress Notes (Deleted)
Patient ID: [name], male    DOB: 02/03/1951, 72 y.o.   MRN: [id number]  HPI  [name] is a 72 y/o male with a history of DM, HTN, COPD, migraines, obstructive sleep apnea and chronic heart failure.  Echo done 03/25/22 showed an EF of 60-65%. Echo report from 04/01/21 reviewed and showed an EF of 55-60% along with mild LVH/ LAE. Echo report from 03/20/18 reviewed and showed an EF of 60-65% along with mild/moderate MR and a mildly elevated PA pressure of 42 mm Hg  Admitted 08/30/22 due to hypotension and bradycardia. Given IVF. 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 2 ED visits the month of December.   He presents today for a follow-up visit with a chief complaint of   Past Medical History:  Diagnosis Date   Asthma    CHF (congestive heart failure) ([place])    COPD (chronic obstructive pulmonary disease) ([place])    COVID-19 03/2020   diagnosed in August 2021   Diabetes mellitus without complication ([place])    Homelessness    Hypertension    Infestation by bed bug    Migraine    Obesity    Sleep apnea    Past Surgical History:  Procedure Laterality Date   CARDIAC CATHETERIZATION     CHOLECYSTECTOMY     EYE SURGERY     INNER EAR SURGERY     NOSE SURGERY     Family History  Problem Relation Age of Onset   Hypertension Mother    Heart disease Mother    Heart failure Maternal Grandmother    Social History   Tobacco Use   Smoking status: Former   Smokeless tobacco: Never  Substance Use Topics   Alcohol use: Never   Allergies  Allergen Reactions   Bee Venom Anaphylaxis   Codeine Itching    Only itching. Recently allergy tested at [facility]   Eggs Or Egg-Derived Products Nausea And Vomiting   Novocain [Procaine]    Sulfa Antibiotics Other (See Comments)     Review of Systems  Constitutional:  Positive for fatigue. Negative for appetite change.  HENT:  Negative for congestion, postnasal drip and sinus pressure.    Eyes:  Positive for visual disturbance (left eye).  Respiratory:  Positive for cough (dry) and shortness of breath (minimal). Negative for chest tightness.   Cardiovascular:  Positive for chest pain, palpitations and leg swelling.  Gastrointestinal:  Positive for abdominal pain. Negative for abdominal distention.  Endocrine: Negative.   Genitourinary: Negative.   Musculoskeletal:  Positive for arthralgias ("joints") and back pain. Negative for neck pain.  Skin: Negative.   Allergic/Immunologic: Negative.   Neurological:  Positive for dizziness and headaches. Negative for light-headedness.  Hematological:  Negative for adenopathy. Does not bruise/bleed easily.  Psychiatric/Behavioral:  Positive for dysphoric mood and sleep disturbance (trouble sleeping; sleeping on 2 pillows). The patient is not nervous/anxious.      Physical Exam Vitals and nursing note reviewed.  Constitutional:      Appearance: Normal appearance.     Comments: disheveled  HENT:     Head: Normocephalic and atraumatic.  Cardiovascular:     Rate and Rhythm: Normal rate and regular rhythm.  Pulmonary:     Effort: Pulmonary effort is normal. No respiratory distress.     Breath sounds: Normal breath sounds. No wheezing or rales.  Abdominal:     General: There is no distension.     Palpations: Abdomen is  soft.     Tenderness: There is no abdominal tenderness.  Musculoskeletal:        General: No tenderness.     Cervical back: Normal range of motion and neck supple.     Right lower leg: No tenderness. Edema (1+ pitting) present.     Left lower leg: No tenderness. Edema (1+ pitting) present.  Skin:    General: Skin is warm and dry.  Neurological:     General: No focal deficit present.     Mental Status: He is alert and oriented to person, place, and time.  Psychiatric:        Mood and Affect: Mood normal.        Behavior: Behavior normal.        Thought Content: Thought content normal.    Assessment &  Plan:  1: Chronic heart failure with preserved ejection fraction- - NYHA class II - euvolemic today - weight 254 pounds from last visit here 3 months ago  - not adding salt to his food but admits finances are tight & he can't always eat low sodium foods; can't always have any food to eat in general - working with paramedicine program - on GDMT of jardiance and lisinopril  - had telemedicine visit with cardiology [name]) 10/06/20 - BNP 05/10/22 was 376.7  2: HTN- - BP  - taking diuretic PRN due to no access to a bathroom - sees PCP at [facility] 09/01/22 reviewed and showed sodium 138, potassium 3.8, creatinine 0.91 and GFR >60  3: DM- - A1c 03/26/22 was 5.4% - not monitoring blood sugars  4: Homelessness- - currently can't get his wallet, ID card etc because they are in a friend's storage bldg and this friend owes money on the bldg - does get around using public bus transportation - has charges pending so can not go to homeless shelter nor leave the county because of probation

## 2022-09-07 NOTE — Telephone Encounter (Signed)
Patient did not show for his Heart Failure Clinic appointment on 09/07/22. Will attempt to reschedule.

## 2022-09-12 NOTE — Congregational Nurse Program (Signed)
  Dept: [phone]   Congregational Nurse Program Note  Date of Encounter: 09/12/2022 Client to [facility] with request for assistance with making medical appointments. Appointment made at [facility] for 1/24 at 1:00 pm. Appointment also made with the Heart failure clinic, Darylene Price NP for hospital follow up. Apt made for 1/19. Client wrote apts down. He has Medicare transportation. RN to assist with this as needed.  Past Medical History: Past Medical History:  Diagnosis Date   Asthma    CHF (congestive heart failure) ([place])    COPD (chronic obstructive pulmonary disease) ([place])    COVID-19 03/2020   diagnosed in August 2021   Diabetes mellitus without complication ([place])    Homelessness    Hypertension    Infestation by bed bug    Migraine    Obesity    Sleep apnea     Encounter Details:  CNP Questionnaire - 09/12/22 0845       Questionnaire   Ask client: Do you give verbal consent for me to treat you today? Yes    Student Assistance N/A    Location Patient [place]    Visit Setting with Client Organization    Patient Status Unhoused   client [place] Medicaid;Medicare    Insurance/Financial Assistance Referral N/A    Medication N/A    Medical Provider Yes   [facility]   Screening Referrals Made N/A    Medical Referrals Made N/A    Medical Appointment Made Non-[facility] PCP/clinic   apt made at [facility] for 1/24 at 1:00 pm   Recently w/o PCP, now 1st time PCP visit completed due to CNs referral or appointment made [place] [employer]    Transportation N/A   currenlty using the [place] bus system   Housing/Utilities No permanent housing    Interpersonal Safety N/A    Interventions Advocate/Support;Navigate Healthcare System   client reported being in the ED, was seen on 11/18   Abnormal to Normal Screening Since Last CN Visit N/A    Screenings CN Performed  N/A    Sent Client to Lab for: N/A    Did client attend any of the following based off CNs referral or appointments made? N/A    ED Visit Averted N/A    Life-Saving Intervention Made N/A

## 2022-09-13 ENCOUNTER — Emergency Department: Payer: 59

## 2022-09-13 DIAGNOSIS — R059 Cough, unspecified: Secondary | ICD-10-CM | POA: Diagnosis present

## 2022-09-13 DIAGNOSIS — R0602 Shortness of breath: Secondary | ICD-10-CM | POA: Insufficient documentation

## 2022-09-13 DIAGNOSIS — R001 Bradycardia, unspecified: Secondary | ICD-10-CM | POA: Diagnosis not present

## 2022-09-13 DIAGNOSIS — J069 Acute upper respiratory infection, unspecified: Secondary | ICD-10-CM | POA: Diagnosis not present

## 2022-09-13 DIAGNOSIS — B9789 Other viral agents as the cause of diseases classified elsewhere: Secondary | ICD-10-CM | POA: Insufficient documentation

## 2022-09-13 DIAGNOSIS — Z20822 Contact with and (suspected) exposure to covid-19: Secondary | ICD-10-CM | POA: Insufficient documentation

## 2022-09-13 LAB — CBC
HCT: 32.2 % — ABNORMAL LOW (ref 39.0–52.0)
Hemoglobin: 10.4 g/dL — ABNORMAL LOW (ref 13.0–17.0)
MCH: 31.2 pg (ref 26.0–34.0)
MCHC: 32.3 g/dL (ref 30.0–36.0)
MCV: 96.7 fL (ref 80.0–100.0)
Platelets: 105 10*3/uL — ABNORMAL LOW (ref 150–400)
RBC: 3.33 MIL/uL — ABNORMAL LOW (ref 4.22–5.81)
RDW: 18.1 % — ABNORMAL HIGH (ref 11.5–15.5)
WBC: 2.9 10*3/uL — ABNORMAL LOW (ref 4.0–10.5)
nRBC: 0 % (ref 0.0–0.2)

## 2022-09-13 LAB — RESP PANEL BY RT-PCR (RSV, FLU A&B, COVID)  RVPGX2
Influenza A by PCR: NEGATIVE
Influenza B by PCR: NEGATIVE
Resp Syncytial Virus by PCR: NEGATIVE
SARS Coronavirus 2 by RT PCR: NEGATIVE

## 2022-09-13 LAB — BASIC METABOLIC PANEL
Anion gap: 4 — ABNORMAL LOW (ref 5–15)
BUN: 14 mg/dL (ref 8–23)
CO2: 25 mmol/L (ref 22–32)
Calcium: 8.6 mg/dL — ABNORMAL LOW (ref 8.9–10.3)
Chloride: 112 mmol/L — ABNORMAL HIGH (ref 98–111)
Creatinine, Ser: 0.95 mg/dL (ref 0.61–1.24)
GFR, Estimated: >60 mL/min (ref >60)
Glucose, Bld: 108 mg/dL — ABNORMAL HIGH (ref 70–99)
Potassium: 3.8 mmol/L (ref 3.5–5.1)
Sodium: 141 mmol/L (ref 135–145)

## 2022-09-13 LAB — BRAIN NATRIURETIC PEPTIDE: B Natriuretic Peptide: 508.4 pg/mL — ABNORMAL HIGH (ref 0.0–100.0)

## 2022-09-13 LAB — TROPONIN I (HIGH SENSITIVITY): Troponin I (High Sensitivity): 16 ng/L (ref <18)

## 2022-09-13 NOTE — ED Triage Notes (Signed)
Pt presents via [facility] with complaints of SOB and cough for the last several days. He states he hasn't had any of his medications because they "haven't  been ready." Hx of CHF. Pt has a dry cough in triage and endorses residual CP from a previous fx rib. Denies fevers, chills, N/V/D.

## 2022-09-14 ENCOUNTER — Encounter: Payer: Medicare Other | Admitting: Family

## 2022-09-14 ENCOUNTER — Emergency Department
Admission: EM | Admit: 2022-09-14 | Discharge: 2022-09-14 | Disposition: A | Discharge status: Home / Self Care | Payer: 59 | Attending: Emergency Medicine | Admitting: Emergency Medicine

## 2022-09-14 ENCOUNTER — Telehealth: Payer: Self-pay | Admitting: Family

## 2022-09-14 DIAGNOSIS — J069 Acute upper respiratory infection, unspecified: Secondary | ICD-10-CM | POA: Diagnosis not present

## 2022-09-14 MED ORDER — BENZONATATE 100 MG PO CAPS: 100.0000 mg | ORAL_CAPSULE | Freq: Three times a day (TID) | ORAL | 0 refills | Status: DC | PRN

## 2022-09-14 MED ORDER — HYDROCOD POLI-CHLORPHE POLI ER 10-8 MG/5ML PO SUER
5.0000 mL | Freq: Once | ORAL | Status: AC
  Administered 2022-09-14: 5 mL via ORAL
  Filled 2022-09-14: qty 5

## 2022-09-14 NOTE — ED Notes (Signed)
Signing pad did not work.

## 2022-09-14 NOTE — ED Notes (Signed)
Pt wanted a bus pass, one was obtained and provided to the patient with his DC instructions.

## 2022-09-14 NOTE — ED Notes (Signed)
ED Provider at bedside. 

## 2022-09-14 NOTE — Telephone Encounter (Signed)
Patient did not show for his Heart Failure Clinic appointment on 09/14/22. This is the 7th appt that he has missed.

## 2022-09-14 NOTE — ED Notes (Signed)
Pt brought to ED rm 13 at this time, this RN now assuming care.

## 2022-09-14 NOTE — Progress Notes (Deleted)
Patient ID: [name], male    DOB: 31-Jul-1951, 72 y.o.   MRN: [id number]  HPI  [name] is a 72 y/o male with a history of DM, HTN, COPD, migraines, obstructive sleep apnea and chronic heart failure.  Echo done 03/25/22 showed an EF of 60-65%. Echo report from 04/01/21 reviewed and showed an EF of 55-60% along with mild LVH/ LAE. Echo report from 03/20/18 reviewed and showed an EF of 60-65% along with mild/moderate Drew and a mildly elevated PA pressure of 42 mm Hg  Was in the ED 09/13/22 due to cough due to viral URI. Admitted 08/30/22 due to hypotension and bradycardia. Given IVF. 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 2 ED visits the month of December.   He presents today for a follow-up visit with a chief complaint of   Past Medical History:  Diagnosis Date   Asthma    CHF (congestive heart failure) ([place])    COPD (chronic obstructive pulmonary disease) ([place])    COVID-19 03/2020   diagnosed in August 2021   Diabetes mellitus without complication ([place])    Homelessness    Hypertension    Infestation by bed bug    Migraine    Obesity    Sleep apnea    Past Surgical History:  Procedure Laterality Date   CARDIAC CATHETERIZATION     CHOLECYSTECTOMY     EYE SURGERY     INNER EAR SURGERY     NOSE SURGERY     Family History  Problem Relation Age of Onset   Hypertension Mother    Heart disease Mother    Heart failure Maternal Grandmother    Social History   Tobacco Use   Smoking status: Former   Smokeless tobacco: Never  Substance Use Topics   Alcohol use: Never   Allergies  Allergen Reactions   Bee Venom Anaphylaxis   Codeine Itching    Only itching. Recently allergy tested at [facility]   Eggs Or Egg-Derived Products Nausea And Vomiting   Novocain [Procaine]    Sulfa Antibiotics Other (See Comments)     Review of Systems  Constitutional:  Positive for fatigue. Negative for appetite change.  HENT:  Negative for  congestion, postnasal drip and sinus pressure.   Eyes:  Positive for visual disturbance (left eye).  Respiratory:  Positive for cough (dry) and shortness of breath (minimal). Negative for chest tightness.   Cardiovascular:  Positive for chest pain, palpitations and leg swelling.  Gastrointestinal:  Positive for abdominal pain. Negative for abdominal distention.  Endocrine: Negative.   Genitourinary: Negative.   Musculoskeletal:  Positive for arthralgias ("joints") and back pain. Negative for neck pain.  Skin: Negative.   Allergic/Immunologic: Negative.   Neurological:  Positive for dizziness and headaches. Negative for light-headedness.  Hematological:  Negative for adenopathy. Does not bruise/bleed easily.  Psychiatric/Behavioral:  Positive for dysphoric mood and sleep disturbance (trouble sleeping; sleeping on 2 pillows). The patient is not nervous/anxious.      Physical Exam Vitals and nursing note reviewed.  Constitutional:      Appearance: Normal appearance.     Comments: disheveled  HENT:     Head: Normocephalic and atraumatic.  Cardiovascular:     Rate and Rhythm: Normal rate and regular rhythm.  Pulmonary:     Effort: Pulmonary effort is normal. No respiratory distress.     Breath sounds: Normal breath sounds. No wheezing or rales.  Abdominal:  General: There is no distension.     Palpations: Abdomen is soft.     Tenderness: There is no abdominal tenderness.  Musculoskeletal:        General: No tenderness.     Cervical back: Normal range of motion and neck supple.     Right lower leg: No tenderness. Edema (1+ pitting) present.     Left lower leg: No tenderness. Edema (1+ pitting) present.  Skin:    General: Skin is warm and dry.  Neurological:     General: No focal deficit present.     Mental Status: He is alert and oriented to person, place, and time.  Psychiatric:        Mood and Affect: Mood normal.        Behavior: Behavior normal.        Thought Content:  Thought content normal.    Assessment & Plan:  1: Chronic heart failure with preserved ejection fraction- - NYHA class II - euvolemic today - weight 254 pounds from last visit here 4 months ago  - not adding salt to his food but admits finances are tight & he can't always eat low sodium foods; can't always have any food to eat in general - working with paramedicine program - on GDMT of jardiance and lisinopril  - had telemedicine visit with cardiology [name]) 10/06/20 - BNP 09/13/22 was 508.4  2: HTN- - BP  - taking diuretic PRN due to no access to a bathroom - sees PCP at [facility] 09/13/22 reviewed and showed sodium 141, potassium 3.8, creatinine 0.95 and GFR >60  3: DM- - A1c 03/26/22 was 5.4% - not monitoring blood sugars  4: Homelessness- - currently can't get his wallet, ID card etc because they are in a friend's storage bldg and this friend owes money on the bldg - does get around using public bus transportation - has charges pending so can not go to homeless shelter nor leave the county because of probation

## 2022-09-14 NOTE — ED Provider Notes (Signed)
[facility] Provider Note    Event Date/Time   First MD Initiated Contact with Patient 09/14/22 0135     (approximate)   History   Shortness of Breath   HPI  [name] is a 72 y.o. male who is well-known to the emergency department with frequent visits for a variety of complaints.  He presents tonight for persistent cough.  He said it has been going on since he left the hospital a couple of weeks ago.  He said he does not feel worse than usual, he would just like the cough to go away.  Occasional shortness of breath, no nausea or vomiting.  No abdominal pain.  No recent fever.     Physical Exam   Triage Vital Signs: ED Triage Vitals  Enc Vitals Group     BP 09/13/22 2306 134/69     Pulse Rate 09/13/22 2306 (!) 58     Resp 09/13/22 2306 18     Temp 09/13/22 2306 98.4 F (36.9 C)     Temp Source 09/13/22 2306 Oral     SpO2 09/13/22 2306 99 %     Weight 09/13/22 2307 99.5 kg (219 lb 5.7 oz)     Height 09/13/22 2307 1.676 m ('5\' 6"'$ )     Head Circumference --      Peak Flow --      Pain Score 09/13/22 2307 4     Pain Loc --      Pain Edu? --      Excl. in [place]? --     Most recent vital signs: Vitals:   09/14/22 0230 09/14/22 0300  BP: (!) 169/71 (!) 148/63  Pulse: (!) 57 (!) 54  Resp: 18 20  Temp:    SpO2: 97% 99%     General: Awake, no distress.  Generally well-appearing. CV:  Good peripheral perfusion.  Mild bradycardia.  Normal heart sounds. Resp:  Normal effort.  No wheezing, no accessory muscle usage, no intercostal retractions. Abd:  No distention.  Other:  Mood and affect are normal.   ED Results / Procedures / Treatments   Labs (all labs ordered are listed, but only abnormal results are displayed) Labs Reviewed  BASIC METABOLIC PANEL - Abnormal; Notable for the following components:      Result Value   Chloride 112 (*)    Glucose, Bld 108 (*)    Calcium 8.6 (*)    Anion gap 4 (*)    All other components within normal  limits  CBC - Abnormal; Notable for the following components:   WBC 2.9 (*)    RBC 3.33 (*)    Hemoglobin 10.4 (*)    HCT 32.2 (*)    RDW 18.1 (*)    Platelets 105 (*)    All other components within normal limits  BRAIN NATRIURETIC PEPTIDE - Abnormal; Notable for the following components:   B Natriuretic Peptide 508.4 (*)    All other components within normal limits  RESP PANEL BY RT-PCR (RSV, FLU A&B, COVID)  RVPGX2  TROPONIN I (HIGH SENSITIVITY)     EKG  ED ECG REPORT I, Hinda Kehr, the attending physician, personally viewed and interpreted this ECG.  Date: 09/13/2022 EKG Time: 23: 05 Rate: 52 Rhythm: Sinus bradycardia QRS Axis: normal Intervals: normal ST/T Wave abnormalities: Non-specific ST segment / T-wave changes, but no clear evidence of acute ischemia. Narrative Interpretation: no definitive evidence of acute ischemia; does not meet STEMI criteria.  RADIOLOGY I viewed and interpreted the patient's two-view chest x-ray and I see no evidence of pneumonia or CHF (pulmonary edema).  I also read the radiologist's report, which confirmed no acute findings.    PROCEDURES:  Critical Care performed: No  Procedures   MEDICATIONS ORDERED IN ED: Medications  chlorpheniramine-HYDROcodone (TUSSIONEX) 10-8 MG/5ML suspension 5 mL (has no administration in time range)     IMPRESSION / MDM / ASSESSMENT AND PLAN / ED COURSE  I reviewed the triage vital signs and the nursing notes.                              Differential diagnosis includes, but is not limited to, viral URI with cough, CHF, COPD, community-acquired pneumonia.  Patient's presentation is most consistent with acute presentation with potential threat to life or bodily function.  Lab/studies ordered: Two-view chest x-ray, EKG, BMP, BNP, high-sensitivity troponin, respiratory viral panel, CBC.  Workup is reassuring.  Mild elevation of BNP but clinically the patient is well-appearing and sounds good.   He has persistent thrombocytopenia and leukopenia but this is chronic for him and actually is better than it has been in the past.  Metabolic panel is reassuring.  High sensitive troponin is normal.  As document above, no acute abnormalities noted on chest x-ray.  He is mostly concerned about his cough.  I ordered a dose of Tussionex in the emergency department as documented above.  I explained that there are no particularly effective options to make his cough go away any faster.  I wrote him a prescription for Tessalon.  I gave my usual customary follow-up recommendations and return precautions.  He understands and agrees with the plan.  I considered hospitalization given that he was just hospitalized a few days ago, but he appears much better at this time and is in no distress with no indication he needs additional workup or treatment.        FINAL CLINICAL IMPRESSION(S) / ED DIAGNOSES   Final diagnoses:  Viral URI with cough     Rx / DC Orders   ED Discharge Orders          Ordered    benzonatate (TESSALON PERLES) 100 MG capsule  3 times daily PRN        09/14/22 0311             Note:  This document was prepared using Dragon voice recognition software and may include unintentional dictation errors.   Hinda Kehr, MD 09/14/22 [phone]

## 2022-09-15 ENCOUNTER — Emergency Department (EMERGENCY_DEPARTMENT_HOSPITAL)
Admission: EM | Admit: 2022-09-15 | Discharge: 2022-09-17 | Disposition: A | Discharge status: Other Acute Inpatient Hospital | Payer: 59 | Source: Home / Self Care | Attending: Emergency Medicine | Admitting: Emergency Medicine

## 2022-09-15 ENCOUNTER — Encounter: Payer: Self-pay | Admitting: Emergency Medicine

## 2022-09-15 ENCOUNTER — Other Ambulatory Visit: Payer: Self-pay

## 2022-09-15 DIAGNOSIS — R45851 Suicidal ideations: Secondary | ICD-10-CM | POA: Insufficient documentation

## 2022-09-15 DIAGNOSIS — Z1152 Encounter for screening for COVID-19: Secondary | ICD-10-CM | POA: Insufficient documentation

## 2022-09-15 DIAGNOSIS — E039 Hypothyroidism, unspecified: Secondary | ICD-10-CM | POA: Insufficient documentation

## 2022-09-15 DIAGNOSIS — F332 Major depressive disorder, 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 psychotic features: Secondary | ICD-10-CM | POA: Diagnosis not present

## 2022-09-15 DIAGNOSIS — Z1339 Encounter for screening examination for other mental health and behavioral disorders: Secondary | ICD-10-CM | POA: Insufficient documentation

## 2022-09-15 LAB — URINE DRUG SCREEN, QUALITATIVE (ARMC ONLY)
Amphetamines, Ur Screen: NOT DETECTED
Barbiturates, Ur Screen: NOT DETECTED
Benzodiazepine, Ur Scrn: NOT DETECTED
Cannabinoid 50 Ng, Ur ~~LOC~~: POSITIVE — AB
Cocaine Metabolite,Ur ~~LOC~~: NOT DETECTED
MDMA (Ecstasy)Ur Screen: NOT DETECTED
Methadone Scn, Ur: NOT DETECTED
Opiate, Ur Screen: POSITIVE — AB
Phencyclidine (PCP) Ur S: NOT DETECTED
Tricyclic, Ur Screen: NOT DETECTED

## 2022-09-15 LAB — COMPREHENSIVE METABOLIC PANEL
ALT: 41 U/L (ref 0–44)
AST: 56 U/L — ABNORMAL HIGH (ref 15–41)
Albumin: 2.7 g/dL — ABNORMAL LOW (ref 3.5–5.0)
Alkaline Phosphatase: 174 U/L — ABNORMAL HIGH (ref 38–126)
Anion gap: 9 (ref 5–15)
BUN: 11 mg/dL (ref 8–23)
CO2: 23 mmol/L (ref 22–32)
Calcium: 8.9 mg/dL (ref 8.9–10.3)
Chloride: 107 mmol/L (ref 98–111)
Creatinine, Ser: 0.83 mg/dL (ref 0.61–1.24)
GFR, Estimated: >60 mL/min (ref >60)
Glucose, Bld: 85 mg/dL (ref 70–99)
Potassium: 3.5 mmol/L (ref 3.5–5.1)
Sodium: 139 mmol/L (ref 135–145)
Total Bilirubin: 3.5 mg/dL — ABNORMAL HIGH (ref 0.3–1.2)
Total Protein: 7 g/dL (ref 6.5–8.1)

## 2022-09-15 LAB — ACETAMINOPHEN LEVEL: Acetaminophen (Tylenol), Serum: <10 ug/mL — ABNORMAL LOW (ref 10–30)

## 2022-09-15 LAB — CBC
HCT: 36.4 % — ABNORMAL LOW (ref 39.0–52.0)
Hemoglobin: 11.9 g/dL — ABNORMAL LOW (ref 13.0–17.0)
MCH: 31.6 pg (ref 26.0–34.0)
MCHC: 32.7 g/dL (ref 30.0–36.0)
MCV: 96.6 fL (ref 80.0–100.0)
Platelets: 138 10*3/uL — ABNORMAL LOW (ref 150–400)
RBC: 3.77 MIL/uL — ABNORMAL LOW (ref 4.22–5.81)
RDW: 18.3 % — ABNORMAL HIGH (ref 11.5–15.5)
WBC: 4.1 10*3/uL (ref 4.0–10.5)
nRBC: 0 % (ref 0.0–0.2)

## 2022-09-15 LAB — CBG MONITORING, ED: Glucose-Capillary: 129 mg/dL — ABNORMAL HIGH (ref 70–99)

## 2022-09-15 LAB — ETHANOL: Alcohol, Ethyl (B): <10 mg/dL (ref <10)

## 2022-09-15 LAB — SALICYLATE LEVEL: Salicylate Lvl: <7 mg/dL — ABNORMAL LOW (ref 7.0–30.0)

## 2022-09-15 MED ORDER — EMPAGLIFLOZIN 10 MG PO TABS
10.0000 mg | ORAL_TABLET | Freq: Every day | ORAL | Status: DC
  Administered 2022-09-16 – 2022-09-17 (×2): 10 mg via ORAL
  Filled 2022-09-15 (×4): qty 1

## 2022-09-15 MED ORDER — INSULIN ASPART 100 UNIT/ML IJ SOLN
4.0000 [IU] | Freq: Three times a day (TID) | INTRAMUSCULAR | Status: DC
  Administered 2022-09-17 (×2): 4 [IU] via SUBCUTANEOUS
  Filled 2022-09-15 (×2): qty 1

## 2022-09-15 MED ORDER — LISINOPRIL 20 MG PO TABS
40.0000 mg | ORAL_TABLET | Freq: Every day | ORAL | Status: DC
  Administered 2022-09-16 – 2022-09-17 (×2): 40 mg via ORAL
  Filled 2022-09-15 (×2): qty 2

## 2022-09-15 MED ORDER — HYDROXYZINE HCL 10 MG PO TABS: 10.0000 mg | ORAL_TABLET | Freq: Three times a day (TID) | ORAL | Status: DC | PRN

## 2022-09-15 MED ORDER — VITAMIN C 500 MG PO TABS
500.0000 mg | ORAL_TABLET | Freq: Every day | ORAL | Status: DC
  Administered 2022-09-16 – 2022-09-17 (×2): 500 mg via ORAL
  Filled 2022-09-15 (×4): qty 1

## 2022-09-15 MED ORDER — ATORVASTATIN CALCIUM 20 MG PO TABS
40.0000 mg | ORAL_TABLET | Freq: Every day | ORAL | Status: DC
  Administered 2022-09-16 – 2022-09-17 (×2): 40 mg via ORAL
  Filled 2022-09-15 (×2): qty 2

## 2022-09-15 MED ORDER — CITALOPRAM HYDROBROMIDE 20 MG PO TABS
10.0000 mg | ORAL_TABLET | Freq: Every day | ORAL | Status: DC
  Administered 2022-09-16: 10 mg via ORAL
  Filled 2022-09-15: qty 1

## 2022-09-15 MED ORDER — ASPIRIN 81 MG PO TBEC
81.0000 mg | DELAYED_RELEASE_TABLET | Freq: Every day | ORAL | Status: DC
  Administered 2022-09-16 – 2022-09-17 (×2): 81 mg via ORAL
  Filled 2022-09-15 (×2): qty 1

## 2022-09-15 MED ORDER — INSULIN ASPART 100 UNIT/ML IJ SOLN
0.0000 [IU] | Freq: Three times a day (TID) | INTRAMUSCULAR | Status: DC
  Administered 2022-09-17: 2 [IU] via SUBCUTANEOUS
  Filled 2022-09-15: qty 1

## 2022-09-15 NOTE — ED Notes (Addendum)
Patients belongings: Fish farm manager, Hydrographic surveyor, red flannel, grey long sleeve shirt, black pants, black shoes, black socks, flannel pants, white pants, black/white boxers, black bag. 2 cell phones with charger placed in black bag.

## 2022-09-15 NOTE — ED Provider Notes (Signed)
[facility] Provider Note   Event Date/Time   First MD Initiated Contact with Patient 09/15/22 [phone]     (approximate) History  Psychiatric Evaluation  HPI [name] is a 72 y.o. male with a past medical history of major depressive disorder who [place] via law enforcement [place].  Patient is voluntary stating that he is homeless but has any hallucinations and will wake up believing someone is stabbing him when there is no one there.  Patient states that he is also having suicidal ideation with a plan to slit his wrists.  Patient currently denies any homicidal ideation, alcohol, or drug abuse.  Patient was recently seen and discharged within the last 24 hours for a URI ROS: Patient currently denies any vision changes, tinnitus, difficulty speaking, facial droop, sore throat, chest pain, shortness of breath, abdominal pain, nausea/vomiting/diarrhea, dysuria, or weakness/numbness/paresthesias in any extremity   Physical Exam  Triage Vital Signs: ED Triage Vitals  Enc Vitals Group     BP 09/15/22 0713 (!) 143/117     Pulse Rate 09/15/22 0713 62     Resp 09/15/22 0713 20     Temp 09/15/22 0713 98.6 F (37 C)     Temp Source 09/15/22 0713 Oral     SpO2 09/15/22 0713 97 %     Weight 09/15/22 0708 245 lb (111.1 kg)     Height 09/15/22 0708 '5\' 6"'$  (1.676 m)     Head Circumference --      Peak Flow --      Pain Score 09/15/22 0708 10     Pain Loc --      Pain Edu? --      Excl. in [place]? --    Most recent vital signs: Vitals:   09/15/22 0713  BP: (!) 143/117  Pulse: 62  Resp: 20  Temp: 98.6 F (37 C)  SpO2: 97%   General: Awake, oriented x4. CV:  Good peripheral perfusion.  Resp:  Normal effort.  Abd:  No distention.  Other:  Elderly disheveled Caucasian male laying in bed in no acute distress ED Results / Procedures / Treatments  Labs (all labs ordered are listed, but only abnormal results are displayed) Labs Reviewed   COMPREHENSIVE METABOLIC PANEL - Abnormal; Notable for the following components:      Result Value   Albumin 2.7 (*)    AST 56 (*)    Alkaline Phosphatase 174 (*)    Total Bilirubin 3.5 (*)    All other components within normal limits  SALICYLATE LEVEL - Abnormal; Notable for the following components:   Salicylate Lvl <5.2 (*)    All other components within normal limits  ACETAMINOPHEN LEVEL - Abnormal; Notable for the following components:   Acetaminophen (Tylenol), Serum <10 (*)    All other components within normal limits  CBC - Abnormal; Notable for the following components:   RBC 3.77 (*)    Hemoglobin 11.9 (*)    HCT 36.4 (*)    RDW 18.3 (*)    Platelets 138 (*)    All other components within normal limits  URINE DRUG SCREEN, QUALITATIVE ([facility] ONLY) - Abnormal; Notable for the following components:   Opiate, Ur Screen POSITIVE (*)    Cannabinoid 50 Ng, Ur [place] POSITIVE (*)    All other components within normal limits  ETHANOL  PROCEDURES: Critical Care performed: No Procedures MEDICATIONS ORDERED IN ED: Medications - No data to display IMPRESSION / MDM /  ASSESSMENT AND PLAN / ED COURSE  I reviewed the triage vital signs and the nursing notes.                             Patient's presentation is most consistent with acute presentation with potential threat to life or bodily function. Thoughts are linear and organized, and patient has no HI. Prior Psychiatric Hospitalizations: None  Clinically patient displays no overt toxidrome; they are well appearing, with low suspicion for toxic ingestion given history and exam. Thoughts unlikely 2/2 anemia, hypothyroidism, infection, or ICH.  Consult: Psychiatry to evaluate patient for potential hold for danger to self. Disposition: Plan admit to psychiatry for further management of symptoms.    FINAL CLINICAL IMPRESSION(S) / ED DIAGNOSES   Final diagnoses:  Suicidal ideation   Rx / DC Orders   ED Discharge Orders      None      Note:  This document was prepared using Dragon voice recognition software and may include unintentional dictation errors.   Naaman Plummer, MD 09/18/22 [phone]

## 2022-09-15 NOTE — ED Triage Notes (Addendum)
Pt via [employer] [place] the [place] tracks in [place], pt is voluntary. Pt is homeless. Pt here stating he is having hallucinations he will wake up thinking someone is stabbing him and no one is there. Pt also having SI with plans to slit his wrist vertically. Denies any HI. Denies ETOH. Denies substance abuse. Pt is A&OX4 and NAD  Pt was just seen and discharged yesterday morning for URI type symptoms.

## 2022-09-15 NOTE — ED Notes (Signed)
VOL, pend re-eval 1.21.24

## 2022-09-15 NOTE — ED Notes (Signed)
Pt given dinner tray and beverage  

## 2022-09-15 NOTE — ED Notes (Signed)
Patient is vol pending re-evaluation in a.m 09-16-22

## 2022-09-15 NOTE — ED Notes (Signed)
Pt endorsing SI with plan to slit his forearm "pt states that way I can get into the vein". Pt states he needs help and if he doesn't get help "I will just get out of here". When asked what he meant he said "I'll off myself" then discussed cutting his forearm from wrist to elbow while demonstrating it. Pt says he hears voices of people who died long ago in the [place] talking to him and sees some of them. Denies HI on assessment.

## 2022-09-15 NOTE — BH Assessment (Signed)
Comprehensive Clinical Assessment (CCA) Note  09/15/2022 WANE MOLLETT [id number]  Chief Complaint:  Chief Complaint  Patient presents with   Psychiatric Evaluation   Mr. See arrived to the ED by way of [facility] Department.  He reports, "A fella found me rolled up on me balled up in the streets screaming that someone was trying to stab me.  It was as real as me in this room is". He reports that he hallucinates when he is stressed.   He reports that he has been traumatized in the [employer] and in prison.   He identified that he wants to cut himself on his arm vertically, "that's how they taught [place] in the [place] to always go this way, that other way is bullshit".  Patient demonstrated with his finger the way he would cut himself. He reports that he is way past hopeless.  He has been stressed about being homeless.  He reports crying all evening, and feeling worthless.  He stated that "I should have been dead a long time ago. I have lived too long".  He states that he cannot take it anymore. He states that he has a lot of bad memories. He shared that he has panic attacks constantly and it disrupts his breathing. He stated that he is constantly in a state of anxiety.  He reports that he had a hallucination today, but denied current hallucinations.  He denied homicidal ideation or intent. He reports that he is stressed by a lack of stability, lack of food, lack of a support system.  He denied the use of alcohol or drugs.   Medical concerns reported by Mr. Goyer are that he has a failing liver, failing kidneys, Asthma, COPD, memory concerns, High Blood Pressure, and Diabetes.  Visit Diagnosis: Major Depression, Suicidal Intent, Panic Disorder   CCA Screening, Triage and Referral (STR)  Patient Reported Information How did you hear about [place]? Legal System  Referral name: No data recorded Referral phone number: No data recorded  Whom do you see for routine medical problems? No data  recorded Practice/Facility Name: No data recorded Practice/Facility Phone Number: No data recorded Name of Contact: No data recorded Contact Number: No data recorded Contact Fax Number: No data recorded Prescriber Name: No data recorded Prescriber Address (if known): No data recorded  What Is the Reason for Your Visit/Call Today? Psychiatric Evaluation  How Long Has This Been Causing You Problems? 1 wk - 1 month  What Do You Feel Would Help You the Most Today? Treatment for Depression or other mood problem; Food Assistance; Housing Assistance; Stress Management; Social Support   Have You Recently Been in Any Inpatient Treatment (Hospital/Detox/Crisis Center/28-Day Program)? No data recorded Name/Location of Program/Hospital:No data recorded How Long Were You There? No data recorded When Were You Discharged? No data recorded  Have You Ever Received Services From [facility] Before? No data recorded Who Do You See at [facility]? No data recorded  Have You Recently Had Any Thoughts About Hurting Yourself? Yes  Are You Planning to Commit Suicide/Harm Yourself At This time? Yes   Have you Recently Had Thoughts About Hurting Someone Guadalupe Dawn? No  Explanation: Reports wanting to cut himself vertically on his arms and bleed out.   Have You Used Any Alcohol or Drugs in the Past 24 Hours? No  How Long Ago Did You Use Drugs or Alcohol? No data recorded What Did You Use and How Much? No data recorded  Do You Currently Have a Therapist/Psychiatrist?  No  Name of Therapist/Psychiatrist: No data recorded  Have You Been Recently Discharged From Any Office Practice or Programs? No  Explanation of Discharge From Practice/Program: No data recorded    CCA Screening Triage Referral Assessment Type of Contact: Face-to-Face  Is this Initial or Reassessment? No data recorded Date Telepsych consult ordered in [facility]:  No data recorded Time Telepsych consult ordered in [facility]:  No data  recorded  Patient Reported Information Reviewed? No data recorded Patient Left Without Being Seen? No data recorded Reason for Not Completing Assessment: No data recorded  Collateral Involvement: None provided   Does Patient Have a [place]? No data recorded Name and Contact of Legal Guardian: No data recorded If Minor and Not Living with Parent(s), Who has Custody? n/a  Is CPS involved or ever been involved? Never  Is APS involved or ever been involved? Never   Patient Determined To Be At Risk for Harm To Self or Others Based on Review of Patient Reported Information or Presenting Complaint? Yes, for Self-Harm  Method: Plan with intent and identified person  Availability of Means: No data recorded Intent: Clearly intends on inflicting harm that could cause death (towards himself)  Notification Required: No data recorded Additional Information for Danger to Others Potential: No data recorded Additional Comments for Danger to Others Potential: No data recorded Are There Guns or Other Weapons in Your Home? No  Types of Guns/Weapons: No data recorded Are These Weapons Safely Secured?                            No data recorded Who Could Verify You Are Able To Have These Secured: No data recorded Do You Have any Outstanding Charges, Pending Court Dates, Parole/Probation? No data recorded Contacted To Inform of Risk of Harm To Self or Others: Family/Significant Other:   [facility] of Assessment: [facility] ED   Does Patient Present under Involuntary Commitment? No  IVC Papers Initial File Date: No data recorded  [place] of Residence: [place]   Patient Currently Receiving the Following Services: Not Receiving Services   Determination of Need: Emergent (2 hours)   Options For Referral: Inpatient Hospitalization     CCA Biopsychosocial Intake/Chief Complaint:  No data recorded Current Symptoms/Problems: No data recorded  Patient Reported  Schizophrenia/Schizoaffective Diagnosis in Past: No   Strengths: Pt is able to ask for help; pt has some insight  Preferences: No data recorded Abilities: No data recorded  Type of Services Patient Feels are Needed: No data recorded  Initial Clinical Notes/Concerns: No data recorded  Mental Health Symptoms Depression:   Hopelessness; Tearfulness; Worthlessness   Duration of Depressive symptoms:  Greater than two weeks   Mania:   None   Anxiety:    Worrying; Tension   Psychosis:   Hallucinations   Duration of Psychotic symptoms:  Less than six months   Trauma:   Avoids reminders of event; Hypervigilance; Irritability/anger; Re-experience of traumatic event   Obsessions:   None   Compulsions:   None   Inattention:   None   Hyperactivity/Impulsivity:   None   Oppositional/Defiant Behaviors:   None   Emotional Irregularity:   N/A   Other Mood/Personality Symptoms:   n/a    Mental Status Exam Appearance and self-care  Stature:   Average   Weight:   Obese   Clothing:   Disheveled   Grooming:   Neglected   Cosmetic use:   None  Posture/gait:   Normal   Motor activity:   Not Remarkable   Sensorium  Attention:   Normal   Concentration:   Normal   Orientation:   X5   Recall/memory:   Normal   Affect and Mood  Affect:   Appropriate   Mood:   Anxious   Relating  Eye contact:   Normal   Facial expression:   Responsive   Attitude toward examiner:   Cooperative   Thought and Language  Speech flow:  Clear and Coherent   Thought content:   Appropriate to Mood and Circumstances   Preoccupation:   None   Hallucinations:   None (None at this time)   Organization:  No data recorded  [employer] of Knowledge:   Average   Intelligence:   Average   Abstraction:   Normal   Judgement:   Normal   Reality Testing:   Distorted   Insight:   Present   Decision Making:   Normal   Social  Functioning  Social Maturity:   Isolates   Social Judgement:   "Street Smart"   Stress  Stressors:   Housing; Transitions; Other (Comment)   Coping Ability:   Deficient supports   Skill Deficits:   Responsibility   Supports:   Support needed     Religion: Religion/Spirituality Are You A Religious Person?:  (UTA) How Might This Affect Treatment?: Not assessed  Leisure/Recreation: Leisure / Recreation Do You Have Hobbies?: No  Exercise/Diet: Exercise/Diet Do You Exercise?: No Have You Gained or Lost A Significant Amount of Weight in the Past Six Months?: No Do You Follow a Special Diet?: No Do You Have Any Trouble Sleeping?: No   CCA Employment/Education Employment/Work Situation: Employment / Work Technical sales engineer: On disability Why is Patient on Disability: Panic Disorder How Long has Patient Been on Disability: Since 1984 Patient's Job has Been Impacted by Current Illness: No Has Patient ever Been in the [employer]?: Yes (Describe in comment) Did You Receive Any Psychiatric Treatment/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  Education: Education Is Patient Currently Attending School?: No Last Grade Completed: 12 Did You Attend College?: No Did You Have An Individualized Education Program (IIEP): No Did You Have Any Difficulty At School?: No   CCA Family/Childhood History Family and Relationship History: Family history Does patient have children?: No  Childhood History:  Childhood History By whom was/is the patient raised?: Mother (Father died when he was young) Did patient suffer any verbal/emotional/physical/sexual abuse as a child?: No (Not assessed) Did patient suffer from severe childhood neglect?: No Has patient ever been sexually abused/assaulted/raped as an adolescent or adult?:  (Not assessed) Was the patient ever a victim of a crime or a disaster?: Yes Patient description of being a victim of 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,  [name] incidents Witnessed domestic violence?: No (Not assessed) Has patient been affected by domestic violence as an adult?: No (Not assessed)  Child/Adolescent Assessment:     CCA Substance Use Alcohol/Drug Use: Alcohol / Drug Use Pain Medications: See MAR Prescriptions: See MAR Over the Counter: See MAR History of alcohol / drug use?: No history of alcohol / drug abuse                         ASAM's:  Six Dimensions of Multidimensional Assessment  Dimension 1:  Acute Intoxication and/or Withdrawal Potential:      Dimension 2:  Biomedical Conditions and Complications:  Dimension 3:  Emotional, Behavioral, or Cognitive Conditions and Complications:     Dimension 4:  Readiness to Change:     Dimension 5:  Relapse, Continued use, or Continued Problem Potential:     Dimension 6:  Recovery/Living Environment:     ASAM Severity Score:    ASAM Recommended Level of Treatment:     Substance use Disorder (SUD)    Recommendations for Services/Supports/Treatments:    DSM5 Diagnoses: Patient Active Problem List   Diagnosis Date Noted   Protein-calorie malnutrition, severe ([place]) 08/31/2022   Hypotension 08/31/2022   Right sided weakness    TIA (transient ischemic attack) 04/28/2022   Obesity    Chronic diastolic CHF (congestive heart failure) ([place])    Depression    Sepsis ([place]) 03/28/2022   Bacteremia due to Klebsiella pneumoniae 03/28/2022   Hypoglycemia 03/28/2022   Syncope 03/24/2022   Hypokalemia 03/24/2022   Fever 03/24/2022   COPD (chronic obstructive pulmonary disease) (HCC)    Elevated troponin    Abnormal LFTs    History of hepatitis C    AKI (acute kidney injury) ([place])    Pure hypercholesterolemia    Pneumonia due to COVID-19 virus 04/19/2020   Elevated transaminase level 04/18/2020   Obesity, Class III, BMI 40-49.9 (morbid obesity) ([place]) 04/18/2020   Lactic acidosis 04/17/2020   Thrombocytopenia ([place]) 04/17/2020   Leukopenia 04/17/2020    COVID-19 virus infection 04/16/2020   HTN (hypertension) 04/17/2018   Uncontrolled type 2 diabetes mellitus with hyperglycemia, without long-term current use of insulin ([place]) 04/17/2018   Lymphedema 04/17/2018   Anemia 04/07/2018   Bilateral leg pain 03/29/2018   Chest pain 03/20/2018   Unsheltered homelessness 12/16/2017     '@[email or web address]'$ @  Elmer Bales, Counselor

## 2022-09-15 NOTE — ED Notes (Signed)
Pt given nighttime snack. 

## 2022-09-15 NOTE — ED Notes (Signed)
Pharmacy called and med verification requested.

## 2022-09-15 NOTE — Consult Note (Signed)
[facility] Consult   Reason for Consult: Suicidal Ideations Referring Physician:  Valora Piccolo MD Patient Identification: [name] MRN:  [id number] Principal Diagnosis: Major depressive disorder, 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 psychotic features ([place]) Diagnosis:  Principal Problem:   Major depressive disorder, 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 psychotic features ([place])   Total Time spent with patient: 20 minutes  Subjective:   [name] is a 72 y.o. male patient admitted with suicidal ideations, with plans to slit wrists. [place] via [employer] [place].  HPI:  Upon evaluation, pt is seen laying in bed, covered with blanket and eyes closed. Pt opened eyes as he heard providers' approach. Pt stated that he was found by [employer] "huddled in a ball screaming." He stated, "[place] thinking someone was trying to attack me." Becoming tearful, he reported being homeless and had been both kicked out and denied by numerous housing facilities. Under these conditions, he endorsed SI with plan to cut wrists, reporting that the [place] taught him the "right way" to do so. When asked if he would he still endorse SI if he had a place to stay, he denied he would attempt suicide. He denied HI, stating no one has harmed him. He reported being unsure of visual hallucinations, but denied auditory hallucinations. He endorsed periods of anxiety, and significant worrying and hopelessness about housing.  Past Psychiatric History: Depression  Risk to Self: moderate  Risk to Others: none Prior Inpatient Therapy: UNK Prior Outpatient Therapy: UNK  Past Medical History:  Past Medical History:  Diagnosis Date   Asthma    CHF (congestive heart failure) (HCC)    COPD (chronic obstructive pulmonary disease) ([place])    COVID-19 03/2020   diagnosed in August 2021   Diabetes mellitus without complication ([place])    Homelessness    Hypertension     Infestation by bed bug    Migraine    Obesity    Sleep apnea     Past Surgical History:  Procedure Laterality Date   CARDIAC CATHETERIZATION     CHOLECYSTECTOMY     EYE SURGERY     INNER EAR SURGERY     NOSE SURGERY     Family History:  Family History  Problem Relation Age of Onset   Hypertension Mother    Heart disease Mother    Heart failure Maternal Grandmother    Family Psychiatric  History: UNK Social History:  Social History   Substance and Sexual Activity  Alcohol Use Never     Social History   Substance and Sexual Activity  Drug Use Never    Social History   Socioeconomic History   Marital status: Single    Spouse name: Not on file   Number of children: 2   Years of education: 12   Highest education level: 12th grade  Occupational History   Occupation: retired  Tobacco Use   Smoking status: Former   Smokeless tobacco: Never  Scientific laboratory technician Use: Not on file  Substance and Sexual Activity   Alcohol use: Never   Drug use: Never   Sexual activity: Never    Birth control/protection: None  Other Topics Concern   Not on file  Social History Narrative   Not on file   Social Determinants of Health   Financial Resource Strain: High Risk (04/27/2022)   Overall Financial Resource Strain (CARDIA)    Difficulty of Paying Living Expenses: Very hard  Food Insecurity: No Food Insecurity (09/01/2022)   Hunger Vital Sign    Worried About Running Out of Food in the Last Year: Never true    Ran Out of Food in the Last Year: Never true  Transportation Needs: No Transportation Needs (09/01/2022)   PRAPARE - Hydrologist (Medical): No    Lack of Transportation (Non-Medical): No  Physical Activity: Sufficiently Active (04/16/2018)   Exercise Vital Sign    Days of Exercise per Week: 7 days    Minutes of Exercise per Session: 30 min  Stress: Stress Concern Present (04/16/2018)   [place]    Feeling of Stress : Very much  Social Connections: Moderately Isolated (04/30/2018)   Social Connection and Isolation Panel [NHANES]    Frequency of Communication with Friends and Family: Twice a week    Frequency of Social Gatherings with Friends and Family: Never    Attends Religious Services: More than 4 times per year    Active Member of [employer] or Organizations: No    Attends Archivist Meetings: Never    Marital Status: Widowed   Additional Social History:    Allergies:   Allergies  Allergen Reactions   Bee Venom Anaphylaxis   Codeine Itching    Only itching. [facility] allergy [facility]   Eggs Or Egg-Derived Products Nausea And Vomiting   Novocain [Procaine]    Sulfa Antibiotics Other (See Comments)    Labs:  Results for orders placed or performed during the hospital encounter of 09/15/22 (from the past 48 hour(s))  Comprehensive metabolic panel     Status: Abnormal   Collection Time: 09/15/22  7:31 AM  Result Value Ref Range   Sodium 139 135 - 145 mmol/L   Potassium 3.5 3.5 - 5.1 mmol/L   Chloride 107 98 - 111 mmol/L   CO2 23 22 - 32 mmol/L   Glucose, Bld 85 70 - 99 mg/dL    Comment: Glucose reference range applies only to samples taken after fasting for at least 8 hours.   BUN 11 8 - 23 mg/dL   Creatinine, Ser 0.83 0.61 - 1.24 mg/dL   Calcium 8.9 8.9 - 10.3 mg/dL   Total Protein 7.0 6.5 - 8.1 g/dL   Albumin 2.7 (L) 3.5 - 5.0 g/dL   AST 56 (H) 15 - 41 U/L   ALT 41 0 - 44 U/L   Alkaline Phosphatase 174 (H) 38 - 126 U/L   Total Bilirubin 3.5 (H) 0.3 - 1.2 mg/dL   GFR, Estimated >60 >60 mL/min    Comment: (NOTE) Calculated using the CKD-EPI Creatinine Equation (2021)    Anion gap 9 5 - 15    Comment: Performed at [facility], [address] [place], [place] [address]  Ethanol     Status: None   Collection Time: 09/15/22  7:31 AM  Result Value Ref Range   Alcohol, Ethyl (B) <10 <10 mg/dL    Comment: (NOTE) Lowest  detectable limit for serum alcohol is 10 mg/dL.  For medical purposes only. Performed at [facility], [address] [place], [place] [address]   Salicylate level     Status: Abnormal   Collection Time: 09/15/22  7:31 AM  Result Value Ref Range   Salicylate Lvl <7.4 (L) 7.0 - 30.0 mg/dL    Comment: Performed at [facility], [address] [place], [place] [address]  Acetaminophen level  Status: Abnormal   Collection Time: 09/15/22  7:31 AM  Result Value Ref Range   Acetaminophen (Tylenol), Serum <10 (L) 10 - 30 ug/mL    Comment: (NOTE) Therapeutic concentrations vary significantly. A range of 10-30 ug/mL  may be an effective concentration for many patients. However, some  are best treated at concentrations outside of this range. Acetaminophen concentrations >150 ug/mL at 4 hours after ingestion  and >50 ug/mL at 12 hours after ingestion are often associated with  toxic reactions.  Performed at [facility], [address] [place], [place] [address]   cbc     Status: Abnormal   Collection Time: 09/15/22  7:31 AM  Result Value Ref Range   WBC 4.1 4.0 - 10.5 K/uL   RBC 3.77 (L) 4.22 - 5.81 MIL/uL   Hemoglobin 11.9 (L) 13.0 - 17.0 g/dL   HCT 36.4 (L) 39.0 - 52.0 %   MCV 96.6 80.0 - 100.0 fL   MCH 31.6 26.0 - 34.0 pg   MCHC 32.7 30.0 - 36.0 g/dL   RDW 18.3 (H) 11.5 - 15.5 %   Platelets 138 (L) 150 - 400 K/uL   nRBC 0.0 0.0 - 0.2 %    Comment: Performed at [facility], [address], [place], [place] [address]  Urine Drug Screen, Qualitative     Status: Abnormal   Collection Time: 09/15/22  7:32 AM  Result Value Ref Range   Tricyclic, Ur Screen NONE DETECTED NONE DETECTED   Amphetamines, Ur Screen NONE DETECTED NONE DETECTED   MDMA (Ecstasy)Ur Screen NONE DETECTED NONE DETECTED   Cocaine Metabolite,Ur [place] NONE DETECTED NONE DETECTED   Opiate, Ur Screen POSITIVE (A) NONE DETECTED   Phencyclidine (PCP) Ur S NONE DETECTED NONE  DETECTED   Cannabinoid 50 Ng, Ur [place] POSITIVE (A) NONE DETECTED   Barbiturates, Ur Screen NONE DETECTED NONE DETECTED   Benzodiazepine, Ur Scrn NONE DETECTED NONE DETECTED   Methadone Scn, Ur NONE DETECTED NONE DETECTED    Comment: (NOTE) Tricyclics + metabolites, urine    Cutoff 1000 ng/mL Amphetamines + metabolites, urine  Cutoff 1000 ng/mL MDMA (Ecstasy), urine              Cutoff 500 ng/mL Cocaine Metabolite, urine          Cutoff 300 ng/mL Opiate + metabolites, urine        Cutoff 300 ng/mL Phencyclidine (PCP), urine         Cutoff 25 ng/mL Cannabinoid, urine                 Cutoff 50 ng/mL Barbiturates + metabolites, urine  Cutoff 200 ng/mL Benzodiazepine, urine              Cutoff 200 ng/mL Methadone, urine                   Cutoff 300 ng/mL  The urine drug screen provides only a preliminary, unconfirmed analytical test result and should not be used for non-medical purposes. Clinical consideration and professional judgment should be applied to any positive drug screen result due to possible interfering substances. A more specific alternate chemical method must be used in order to obtain a confirmed analytical result. Gas chromatography / mass spectrometry (GC/MS) is the preferred confirm atory method. Performed at [facility], [address] [place], [place] [address]     No current facility-administered medications for this encounter.   Current Outpatient Medications  Medication Sig Dispense Refill   albuterol (  PROVENTIL) (2.5 MG/3ML) 0.083% nebulizer solution Take 3 mLs by nebulization every 4 (four) hours as needed for wheezing or shortness of breath. 75 mL 0   albuterol (VENTOLIN HFA) 108 (90 Base) MCG/ACT inhaler Inhale 2 puffs into the lungs every 6 (six) hours as needed for wheezing or shortness of breath. 1 each 5   ascorbic acid (VITAMIN C) 500 MG tablet Take 1 tablet (500 mg total) by mouth daily.     aspirin EC 81 MG tablet Take 1 tablet (81 mg total)  by mouth daily. Swallow whole. 30 tablet 5   atorvastatin (LIPITOR) 40 MG tablet Take 1 tablet (40 mg total) by mouth daily. 30 tablet 5   benzonatate (TESSALON PERLES) 100 MG capsule Take 1 capsule (100 mg total) by mouth 3 (three) times daily as needed for cough. 30 capsule 0   buprenorphine-naloxone (SUBOXONE) 2-0.5 mg SUBL SL tablet Place 1 tablet under the tongue in the morning and at bedtime. [name]     buprenorphine-naloxone (SUBOXONE) 8-2 mg SUBL SL tablet Place 1 tablet under the tongue in the morning and at bedtime.     cyclobenzaprine (FLEXERIL) 5 MG tablet Take 5 mg by mouth at bedtime as needed.     EPINEPHrine 0.3 mg/0.3 mL IJ SOAJ injection Inject 0.3 mg into the muscle as needed.     fluticasone (FLONASE) 50 MCG/ACT nasal spray Place 1 spray into both nostrils daily.     furosemide (LASIX) 20 MG tablet Take 20 mg by mouth every morning.     glucosamine-chondroitin 500-400 MG tablet Take 1 tablet by mouth 3 (three) times daily.     hydrOXYzine (ATARAX) 10 MG tablet Take 10 mg by mouth 3 (three) times daily as needed.     JARDIANCE 10 MG TABS tablet Take 1 tablet (10 mg total) by mouth daily. 30 tablet 5   lansoprazole (PREVACID) 30 MG capsule Take 30 mg by mouth daily.     lisinopril (ZESTRIL) 40 MG tablet Take 1 tablet (40 mg total) by mouth daily. 30 tablet 5   metFORMIN (GLUCOPHAGE-XR) 500 MG 24 hr tablet Take 1 tablet (500 mg total) by mouth daily. 30 tablet 5   Multiple Vitamin (MULTIVITAMIN WITH MINERALS) TABS tablet Take 1 tablet by mouth daily.     nitroGLYCERIN (NITROSTAT) 0.4 MG SL tablet Place 1 tablet (0.4 mg total) under the tongue every 5 (five) minutes x 3 doses as needed for chest pain. 30 tablet 5   ondansetron (ZOFRAN-ODT) 4 MG disintegrating tablet Take 1 tablet (4 mg total) by mouth every 6 (six) hours as needed for nausea or vomiting. 20 tablet 0   ondansetron (ZOFRAN-ODT) 4 MG disintegrating tablet Take 1 tablet (4 mg total) by mouth every 8 (eight) hours  as needed. 20 tablet 0   oxyCODONE-acetaminophen (PERCOCET) 5-325 MG tablet Take 1 tablet by mouth every 4 (four) hours as needed for severe pain. 20 tablet 0   PARoxetine (PAXIL) 10 MG tablet Take 1 tablet (10 mg total) by mouth daily. 30 tablet 5   tamsulosin (FLOMAX) 0.4 MG CAPS capsule Take 0.4 mg by mouth daily.      Musculoskeletal: Strength & Muscle Tone: WDL Gait & Station:  WDL Patient leans: N/A  Psychiatric Specialty Exam: Physical Exam Vitals and nursing note reviewed.  Constitutional:      Appearance: Normal appearance.  HENT:     Head: Normocephalic.     Nose: Nose normal.  Pulmonary:     Effort: Pulmonary effort is  normal.  Musculoskeletal:        General: Normal range of motion.     Cervical back: Normal range of motion.  Neurological:     General: No focal deficit present.     Mental Status: He is alert and oriented to person, place, and time.  Psychiatric:        Attention and Perception: Attention and perception normal.        Mood and Affect: Mood is anxious and depressed.        Speech: Speech normal.        Behavior: Behavior normal. Behavior is cooperative.        Thought Content: Thought content includes suicidal ideation. Thought content includes suicidal plan.        Cognition and Memory: Cognition and memory normal.        Judgment: Judgment normal.     Review of Systems  Psychiatric/Behavioral:  Positive for depression. The patient is nervous/anxious.   All other systems reviewed and are negative.   Blood pressure (!) 143/117, pulse 62, temperature 98.6 F (37 C), temperature source Oral, resp. rate 20, height '5\' 6"'$  (1.676 m), weight 111.1 kg, SpO2 97 %.Body mass index is 39.54 kg/m.  General Appearance: Disheveled  Eye Contact:  Good  Speech:  Clear and Coherent  Volume:  Normal  Mood:  Depressed and Hopeless  Affect:  Congruent, Depressed, and Restricted  Thought Process:  Coherent, Goal Directed, and Linear  Orientation:  Other:   Place, person and situation  Thought Content:  WDL  Suicidal Thoughts:  Yes.  with intent/plan to slit wrists  Homicidal Thoughts:  No  Memory:  Immediate;   Good  Judgement:  Fair  Insight:  Fair  Psychomotor Activity:  Negative  Concentration:  Concentration: Good and Attention Span: Good  Recall:  [place] of Knowledge:  Good  Language:  Good  Akathisia:  Negative  Handed:  Right  AIMS (if indicated):     Assets:  Communication Skills Desire for Improvement Financial Resources/Insurance Resilience  ADL's:  Intact  Cognition:  WNL  Sleep:        Physical Exam: Physical Exam Vitals and nursing note reviewed.  Constitutional:      Appearance: Normal appearance.  HENT:     Head: Normocephalic.     Nose: Nose normal.  Pulmonary:     Effort: Pulmonary effort is normal.  Musculoskeletal:        General: Normal range of motion.     Cervical back: Normal range of motion.  Neurological:     General: No focal deficit present.     Mental Status: He is alert and oriented to person, place, and time.  Psychiatric:        Attention and Perception: Attention and perception normal.        Mood and Affect: Mood is anxious and depressed.        Speech: Speech normal.        Behavior: Behavior normal. Behavior is cooperative.        Thought Content: Thought content includes suicidal ideation. Thought content includes suicidal plan.        Cognition and Memory: Cognition and memory normal.        Judgment: Judgment normal.    Review of Systems  Psychiatric/Behavioral:  Positive for depression. The patient is nervous/anxious.   All other systems reviewed and are negative.  Blood pressure (!) 143/117, pulse 62, temperature 98.6 F (37 C), temperature source  Oral, resp. rate 20, height '5\' 6"'$  (1.676 m), weight 111.1 kg, SpO2 97 %. Body mass index is 39.54 kg/m.  Treatment Plan Summary: Major depressive disorder, recurrent, severe without psychosis: Admit to adult gero  inpatient Started Celexa 10 mg daily  Disposition: Admit to geropsych  Waylan Boga, NP 09/15/2022 10:15 AM

## 2022-09-16 LAB — CBG MONITORING, ED
Glucose-Capillary: 72 mg/dL (ref 70–99)
Glucose-Capillary: 89 mg/dL (ref 70–99)
Glucose-Capillary: 84 mg/dL (ref 70–99)

## 2022-09-16 LAB — HEMOGLOBIN A1C
Hgb A1c MFr Bld: 4.9 % (ref 4.8–5.6)
Mean Plasma Glucose: 93.93 mg/dL

## 2022-09-16 MED ORDER — BUTALBITAL-APAP-CAFFEINE 50-325-40 MG PO TABS
2.0000 | ORAL_TABLET | Freq: Once | ORAL | Status: AC
  Administered 2022-09-16: 2 via ORAL
  Filled 2022-09-16: qty 2

## 2022-09-16 MED ORDER — ACETAMINOPHEN 500 MG PO TABS
1000.0000 mg | ORAL_TABLET | Freq: Once | ORAL | Status: AC
  Administered 2022-09-16: 1000 mg via ORAL
  Filled 2022-09-16: qty 2

## 2022-09-16 MED ORDER — CITALOPRAM HYDROBROMIDE 20 MG PO TABS
20.0000 mg | ORAL_TABLET | Freq: Every day | ORAL | Status: DC
  Administered 2022-09-17: 20 mg via ORAL
  Filled 2022-09-16: qty 1

## 2022-09-16 NOTE — ED Notes (Signed)
Pt denies active SI stating he will be fine if people just left him alone and didn't bother him. Rambling about the sheriff and police being out to get him and using the system against him. Denies HI and AVH at time of assessment. Informed of plan for inpatient admission and he verbalized understanding of this. Advised awaiting bed placement.

## 2022-09-16 NOTE — ED Notes (Signed)
This tech obtained vitals on pt.

## 2022-09-16 NOTE — Consult Note (Signed)
[facility] Consult   Reason for Consult: Suicidal Ideations Referring Physician:  Valora Piccolo MD Patient Identification: [name] MRN:  [id number] Principal Diagnosis: Major depressive disorder, 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 psychotic features ([place]) Diagnosis:  Principal Problem:   Major depressive disorder, 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 psychotic features ([place])   Total Time spent with patient: 20 minutes  Subjective:   [name] is a 72 y.o. male patient admitted with suicidal ideations, with plans to slit wrists. Pt [place] voluntary via [employer] [place] in [place].  Client continues to voice suicidal ideations with the stress of being homeless and the weather outside.  He continues to have a plan to cut his arms to die.  Pleasant on assessment, sleep was poor, appetite is good.  Gero placement being sought.  HPI on admission:  Upon evaluation, pt is seen laying in bed, covered with blanket and eyes closed. Pt opened eyes as he heard providers' approach. Pt stated that he was found by [employer] "huddled in a ball screaming." He stated, "I [place] another [place] thinking someone was trying to attack me." Becoming tearful, he reported being homeless and had been both kicked out and denied by numerous housing facilities. Under these conditions, he endorsed SI with plan to cut wrists, reporting that the [place] taught him the "right way" to do so. When asked if he would he still endorse SI if he had a place to stay, he denied he would attempt suicide. He denied HI, stating no one has harmed him. He reported being unsure of visual hallucinations, but denied auditory hallucinations. He endorsed periods of anxiety, and significant worrying and hopelessness about housing.  Past Psychiatric History: Depression  Risk to Self: moderate  Risk to Others: none Prior Inpatient Therapy: UNK Prior Outpatient Therapy: UNK  Past Medical History:  Past Medical History:   Diagnosis Date   Asthma    CHF (congestive heart failure) (HCC)    COPD (chronic obstructive pulmonary disease) ([place])    COVID-19 03/2020   diagnosed in August 2021   Diabetes mellitus without complication ([place])    Homelessness    Hypertension    Infestation by bed bug    Migraine    Obesity    Sleep apnea     Past Surgical History:  Procedure Laterality Date   CARDIAC CATHETERIZATION     CHOLECYSTECTOMY     EYE SURGERY     INNER EAR SURGERY     NOSE SURGERY     Family History:  Family History  Problem Relation Age of Onset   Hypertension Mother    Heart disease Mother    Heart failure Maternal Grandmother    Family Psychiatric  History: UNK Social History:  Social History   Substance and Sexual Activity  Alcohol Use Never     Social History   Substance and Sexual Activity  Drug Use Never    Social History   Socioeconomic History   Marital status: Single    Spouse name: Not on file   Number of children: 2   Years of education: 12   Highest education level: 12th grade  Occupational History   Occupation: retired  Tobacco Use   Smoking status: Former   Smokeless tobacco: Never  Scientific laboratory technician Use: Not on file  Substance and Sexual Activity   Alcohol use: Never   Drug use: Never   Sexual activity: Never    Birth control/protection: None  Other Topics  Concern   Not on file  Social History Narrative   Not on file   Social Determinants of Health   Financial Resource Strain: High Risk (04/27/2022)   Overall Financial Resource Strain (CARDIA)    Difficulty of Paying Living Expenses: Very hard  Food Insecurity: No Food Insecurity (09/01/2022)   Hunger Vital Sign    Worried About Running Out of Food in the Last Year: Never true    Ran Out of Food in the Last Year: Never true  Transportation Needs: No Transportation Needs (09/01/2022)   PRAPARE - Hydrologist (Medical): No    Lack of Transportation (Non-Medical): No   Physical Activity: Sufficiently Active (04/16/2018)   Exercise Vital Sign    Days of Exercise per Week: 7 days    Minutes of Exercise per Session: 30 min  Stress: Stress Concern Present (04/16/2018)   [place]    Feeling of Stress : Very much  Social Connections: Moderately Isolated (04/30/2018)   Social Connection and Isolation Panel [NHANES]    Frequency of Communication with Friends and Family: Twice a week    Frequency of Social Gatherings with Friends and Family: Never    Attends Religious Services: More than 4 times per year    Active Member of [employer] or Organizations: No    Attends Archivist Meetings: Never    Marital Status: Widowed   Additional Social History:    Allergies:   Allergies  Allergen Reactions   Bee Venom Anaphylaxis   Codeine Itching    Only itching. Recently allergy tested at [facility]   Eggs Or Egg-Derived Products Nausea And Vomiting   Novocain [Procaine]    Sulfa Antibiotics Other (See Comments)    Labs:  Results for orders placed or performed during the hospital encounter of 09/15/22 (from the past 48 hour(s))  Comprehensive metabolic panel     Status: Abnormal   Collection Time: 09/15/22  7:31 AM  Result Value Ref Range   Sodium 139 135 - 145 mmol/L   Potassium 3.5 3.5 - 5.1 mmol/L   Chloride 107 98 - 111 mmol/L   CO2 23 22 - 32 mmol/L   Glucose, Bld 85 70 - 99 mg/dL    Comment: Glucose reference range applies only to samples taken after fasting for at least 8 hours.   BUN 11 8 - 23 mg/dL   Creatinine, Ser 0.83 0.61 - 1.24 mg/dL   Calcium 8.9 8.9 - 10.3 mg/dL   Total Protein 7.0 6.5 - 8.1 g/dL   Albumin 2.7 (L) 3.5 - 5.0 g/dL   AST 56 (H) 15 - 41 U/L   ALT 41 0 - 44 U/L   Alkaline Phosphatase 174 (H) 38 - 126 U/L   Total Bilirubin 3.5 (H) 0.3 - 1.2 mg/dL   GFR, Estimated >60 >60 mL/min    Comment: (NOTE) Calculated using the CKD-EPI Creatinine Equation (2021)     Anion gap 9 5 - 15    Comment: Performed at [facility], [address] [place], [place] [address]  Ethanol     Status: None   Collection Time: 09/15/22  7:31 AM  Result Value Ref Range   Alcohol, Ethyl (B) <10 <10 mg/dL    Comment: (NOTE) Lowest detectable limit for serum alcohol is 10 mg/dL.  For medical purposes only. Performed at [facility], [address] [place], [place] [address]   Salicylate level  Status: Abnormal   Collection Time: 09/15/22  7:31 AM  Result Value Ref Range   Salicylate Lvl <0.1 (L) 7.0 - 30.0 mg/dL    Comment: Performed at [facility], [address] [place], [place] [address]  Acetaminophen level     Status: Abnormal   Collection Time: 09/15/22  7:31 AM  Result Value Ref Range   Acetaminophen (Tylenol), Serum <10 (L) 10 - 30 ug/mL    Comment: (NOTE) Therapeutic concentrations vary significantly. A range of 10-30 ug/mL  may be an effective concentration for many patients. However, some  are best treated at concentrations outside of this range. Acetaminophen concentrations >150 ug/mL at 4 hours after ingestion  and >50 ug/mL at 12 hours after ingestion are often associated with  toxic reactions.  Performed at [facility], [address] [place], [place] [address]   cbc     Status: Abnormal   Collection Time: 09/15/22  7:31 AM  Result Value Ref Range   WBC 4.1 4.0 - 10.5 K/uL   RBC 3.77 (L) 4.22 - 5.81 MIL/uL   Hemoglobin 11.9 (L) 13.0 - 17.0 g/dL   HCT 36.4 (L) 39.0 - 52.0 %   MCV 96.6 80.0 - 100.0 fL   MCH 31.6 26.0 - 34.0 pg   MCHC 32.7 30.0 - 36.0 g/dL   RDW 18.3 (H) 11.5 - 15.5 %   Platelets 138 (L) 150 - 400 K/uL   nRBC 0.0 0.0 - 0.2 %    Comment: Performed at [facility], [address] [place], [place] [address]  Hemoglobin A1c     Status: None   Collection Time: 09/15/22  7:31 AM  Result Value Ref Range   Hgb A1c MFr Bld 4.9 4.8 - 5.6 %    Comment: (NOTE) Pre diabetes:           5.7%-6.4%  Diabetes:              >6.4%  Glycemic control for   <7.0% adults with diabetes    Mean Plasma Glucose 93.93 mg/dL    Comment: Performed at [facility], [place] [address] [place], [place] [address]  Urine Drug Screen, Qualitative     Status: Abnormal   Collection Time: 09/15/22  7:32 AM  Result Value Ref Range   Tricyclic, Ur Screen NONE DETECTED NONE DETECTED   Amphetamines, Ur Screen NONE DETECTED NONE DETECTED   MDMA (Ecstasy)Ur Screen NONE DETECTED NONE DETECTED   Cocaine Metabolite,Ur [place] NONE DETECTED NONE DETECTED   Opiate, Ur Screen POSITIVE (A) NONE DETECTED   Phencyclidine (PCP) Ur S NONE DETECTED NONE DETECTED   Cannabinoid 50 Ng, Ur  POSITIVE (A) NONE DETECTED   Barbiturates, Ur Screen NONE DETECTED NONE DETECTED   Benzodiazepine, Ur Scrn NONE DETECTED NONE DETECTED   Methadone Scn, Ur NONE DETECTED NONE DETECTED    Comment: (NOTE) Tricyclics + metabolites, urine    Cutoff 1000 ng/mL Amphetamines + metabolites, urine  Cutoff 1000 ng/mL MDMA (Ecstasy), urine              Cutoff 500 ng/mL Cocaine Metabolite, urine          Cutoff 300 ng/mL Opiate + metabolites, urine        Cutoff 300 ng/mL Phencyclidine (PCP), urine         Cutoff 25 ng/mL Cannabinoid, urine                 Cutoff 50 ng/mL Barbiturates + metabolites, urine  Cutoff 200 ng/mL Benzodiazepine, urine              Cutoff 200 ng/mL Methadone, urine                   Cutoff 300 ng/mL  The urine drug screen provides only a preliminary, unconfirmed analytical test result and should not be used for non-medical purposes. Clinical consideration and professional judgment should be applied to any positive drug screen result due to possible interfering substances. A more specific alternate chemical method must be used in order to obtain a confirmed analytical result. Gas chromatography / mass spectrometry (GC/MS) is the preferred confirm atory method. Performed at [facility],  [address] [place], [place] [address]   CBG monitoring, ED     Status: Abnormal   Collection Time: 09/15/22  9:32 PM  Result Value Ref Range   Glucose-Capillary 129 (H) 70 - 99 mg/dL    Comment: Glucose reference range applies only to samples taken after fasting for at least 8 hours.  CBG monitoring, ED     Status: None   Collection Time: 09/16/22  8:01 AM  Result Value Ref Range   Glucose-Capillary 72 70 - 99 mg/dL    Comment: Glucose reference range applies only to samples taken after fasting for at least 8 hours.  CBG monitoring, ED     Status: None   Collection Time: 09/16/22 12:22 PM  Result Value Ref Range   Glucose-Capillary 89 70 - 99 mg/dL    Comment: Glucose reference range applies only to samples taken after fasting for at least 8 hours.   Comment 1 Notify RN    Comment 2 Document in Chart     Current Facility-Administered Medications  Medication Dose Route Frequency Provider Last Rate Last Admin   ascorbic acid (VITAMIN C) tablet 500 mg  500 mg Oral Daily Patrecia Pour, NP   500 mg at 09/16/22 6301   aspirin EC tablet 81 mg  81 mg Oral Daily Patrecia Pour, NP   81 mg at 09/16/22 0920   atorvastatin (LIPITOR) tablet 40 mg  40 mg Oral Daily Patrecia Pour, NP   40 mg at 09/16/22 0920   [START ON 09/17/2022] citalopram (CELEXA) tablet 20 mg  20 mg Oral Daily Patrecia Pour, NP       empagliflozin (JARDIANCE) tablet 10 mg  10 mg Oral Daily Patrecia Pour, NP   10 mg at 09/16/22 6010   hydrOXYzine (ATARAX) tablet 10 mg  10 mg Oral TID PRN Patrecia Pour, NP       insulin aspart (novoLOG) injection 0-15 Units  0-15 Units Subcutaneous TID WC Patrecia Pour, NP       insulin aspart (novoLOG) injection 4 Units  4 Units Subcutaneous TID WC Patrecia Pour, NP       lisinopril (ZESTRIL) tablet 40 mg  40 mg Oral Daily Patrecia Pour, NP   40 mg at 09/16/22 9323   Current Outpatient Medications  Medication Sig Dispense Refill   albuterol (VENTOLIN HFA) 108 (90 Base)  MCG/ACT inhaler Inhale 2 puffs into the lungs every 6 (six) hours as needed for wheezing or shortness of breath. 1 each 5   EPINEPHrine 0.3 mg/0.3 mL IJ SOAJ injection Inject 0.3 mg into the muscle as needed.     furosemide (LASIX) 20 MG tablet Take 20 mg by mouth every morning.     hydrOXYzine (ATARAX) 10 MG tablet Take 10 mg  by mouth 3 (three) times daily as needed.     lansoprazole (PREVACID) 30 MG capsule Take 30 mg by mouth daily.     albuterol (PROVENTIL) (2.5 MG/3ML) 0.083% nebulizer solution Take 3 mLs by nebulization every 4 (four) hours as needed for wheezing or shortness of breath. (Patient not taking: Reported on 09/15/2022) 75 mL 0   ascorbic acid (VITAMIN C) 500 MG tablet Take 1 tablet (500 mg total) by mouth daily.     aspirin EC 81 MG tablet Take 1 tablet (81 mg total) by mouth daily. Swallow whole. 30 tablet 5   atorvastatin (LIPITOR) 40 MG tablet Take 1 tablet (40 mg total) by mouth daily. (Patient not taking: Reported on 09/15/2022) 30 tablet 5   glucosamine-chondroitin 500-400 MG tablet Take 1 tablet by mouth 3 (three) times daily.     JARDIANCE 10 MG TABS tablet Take 1 tablet (10 mg total) by mouth daily. (Patient not taking: Reported on 09/15/2022) 30 tablet 5   lisinopril (ZESTRIL) 40 MG tablet Take 1 tablet (40 mg total) by mouth daily. 30 tablet 5   metFORMIN (GLUCOPHAGE-XR) 500 MG 24 hr tablet Take 1 tablet (500 mg total) by mouth daily. 30 tablet 5   Multiple Vitamin (MULTIVITAMIN WITH MINERALS) TABS tablet Take 1 tablet by mouth daily.     nitroGLYCERIN (NITROSTAT) 0.4 MG SL tablet Place 1 tablet (0.4 mg total) under the tongue every 5 (five) minutes x 3 doses as needed for chest pain. 30 tablet 5    Musculoskeletal: Strength & Muscle Tone: WDL Gait & Station:  WDL Patient leans: N/A  Psychiatric Specialty Exam: Physical Exam Vitals and nursing note reviewed.  Constitutional:      Appearance: Normal appearance.  HENT:     Head: Normocephalic.     Nose: Nose  normal.  Pulmonary:     Effort: Pulmonary effort is normal.  Musculoskeletal:        General: Normal range of motion.     Cervical back: Normal range of motion.  Neurological:     General: No focal deficit present.     Mental Status: He is alert and oriented to person, place, and time.  Psychiatric:        Attention and Perception: Attention and perception normal.        Mood and Affect: Mood is anxious and depressed.        Speech: Speech normal.        Behavior: Behavior normal. Behavior is cooperative.        Thought Content: Thought content includes suicidal ideation. Thought content includes suicidal plan.        Cognition and Memory: Cognition and memory normal.        Judgment: Judgment normal.     Review of Systems  Psychiatric/Behavioral:  Positive for depression. The patient is nervous/anxious.   All other systems reviewed and are negative.   Blood pressure 119/74, pulse 65, temperature 98.2 F (36.8 C), temperature source Oral, resp. rate 18, height '5\' 6"'$  (1.676 m), weight 111.1 kg, SpO2 100 %.Body mass index is 39.54 kg/m.  General Appearance: Disheveled  Eye Contact:  Good  Speech:  Clear and Coherent  Volume:  Normal  Mood:  Depressed and Hopeless  Affect:  Congruent, Depressed, and Restricted  Thought Process:  Coherent, Goal Directed, and Linear  Orientation:  Other:  Place, person and situation  Thought Content:  WDL  Suicidal Thoughts:  Yes.  with intent/plan to slit wrists  Homicidal Thoughts:  No  Memory:  Immediate;   Good  Judgement:  Fair  Insight:  Fair  Psychomotor Activity:  Negative  Concentration:  Concentration: Good and Attention Span: Good  Recall:  [place] of Knowledge:  Good  Language:  Good  Akathisia:  Negative  Handed:  Right  AIMS (if indicated):     Assets:  Communication Skills Desire for Improvement Financial Resources/Insurance Resilience  ADL's:  Intact  Cognition:  WNL  Sleep:        Physical Exam: Physical  Exam Vitals and nursing note reviewed.  Constitutional:      Appearance: Normal appearance.  HENT:     Head: Normocephalic.     Nose: Nose normal.  Pulmonary:     Effort: Pulmonary effort is normal.  Musculoskeletal:        General: Normal range of motion.     Cervical back: Normal range of motion.  Neurological:     General: No focal deficit present.     Mental Status: He is alert and oriented to person, place, and time.  Psychiatric:        Attention and Perception: Attention and perception normal.        Mood and Affect: Mood is anxious and depressed.        Speech: Speech normal.        Behavior: Behavior normal. Behavior is cooperative.        Thought Content: Thought content includes suicidal ideation. Thought content includes suicidal plan.        Cognition and Memory: Cognition and memory normal.        Judgment: Judgment normal.    Review of Systems  Psychiatric/Behavioral:  Positive for depression. The patient is nervous/anxious.   All other systems reviewed and are negative.  Blood pressure 119/74, pulse 65, temperature 98.2 F (36.8 C), temperature source Oral, resp. rate 18, height '5\' 6"'$  (1.676 m), weight 111.1 kg, SpO2 100 %. Body mass index is 39.54 kg/m.  Treatment Plan Summary: Major depressive disorder, recurrent, severe without psychosis: Admit to adult gero inpatient Increased Celexa 10 mg daily to 20 mg daily  Disposition: Admit to geropsych  Waylan Boga, NP 09/16/2022 12:26 PM

## 2022-09-16 NOTE — ED Notes (Signed)
Patient is vol pending geropsych admit

## 2022-09-16 NOTE — ED Notes (Signed)
Pt given snack and drink 

## 2022-09-16 NOTE — ED Notes (Signed)
Patient complaining of a caffeine withdraw headache 8/10 pain.

## 2022-09-17 ENCOUNTER — Encounter: Payer: Self-pay | Admitting: Psychiatry

## 2022-09-17 ENCOUNTER — Other Ambulatory Visit: Payer: Self-pay

## 2022-09-17 ENCOUNTER — Inpatient Hospital Stay
Admission: AD | Admit: 2022-09-17 | Discharge: 2022-09-22 | DRG: 885 | Disposition: A | Discharge status: Other Acute Inpatient Hospital | Payer: 59 | Source: Intra-hospital | Attending: Psychiatry | Admitting: Psychiatry

## 2022-09-17 DIAGNOSIS — R440 Auditory hallucinations: Secondary | ICD-10-CM | POA: Diagnosis present

## 2022-09-17 DIAGNOSIS — I110 Hypertensive heart disease with heart failure: Secondary | ICD-10-CM | POA: Diagnosis present

## 2022-09-17 DIAGNOSIS — G43909 Migraine, unspecified, not intractable, without status migrainosus: Secondary | ICD-10-CM | POA: Diagnosis present

## 2022-09-17 DIAGNOSIS — Z91011 Allergy to milk products: Secondary | ICD-10-CM | POA: Diagnosis not present

## 2022-09-17 DIAGNOSIS — C220 Liver cell carcinoma: Secondary | ICD-10-CM | POA: Diagnosis present

## 2022-09-17 DIAGNOSIS — Z888 Allergy status to other drugs, medicaments and biological substances status: Secondary | ICD-10-CM

## 2022-09-17 DIAGNOSIS — F419 Anxiety disorder, unspecified: Secondary | ICD-10-CM | POA: Diagnosis present

## 2022-09-17 DIAGNOSIS — K766 Portal hypertension: Secondary | ICD-10-CM | POA: Diagnosis present

## 2022-09-17 DIAGNOSIS — F332 Major depressive disorder, 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 psychotic features: Secondary | ICD-10-CM | POA: Diagnosis not present

## 2022-09-17 DIAGNOSIS — E669 Obesity, unspecified: Secondary | ICD-10-CM | POA: Diagnosis present

## 2022-09-17 DIAGNOSIS — D696 Thrombocytopenia, unspecified: Secondary | ICD-10-CM | POA: Diagnosis present

## 2022-09-17 DIAGNOSIS — Z5902 Unsheltered homelessness: Secondary | ICD-10-CM

## 2022-09-17 DIAGNOSIS — G4730 Sleep apnea, unspecified: Secondary | ICD-10-CM | POA: Diagnosis present

## 2022-09-17 DIAGNOSIS — I509 Heart failure, unspecified: Secondary | ICD-10-CM | POA: Diagnosis present

## 2022-09-17 DIAGNOSIS — Z87891 Personal history of nicotine dependence: Secondary | ICD-10-CM | POA: Diagnosis not present

## 2022-09-17 DIAGNOSIS — Z7984 Long term (current) use of oral hypoglycemic drugs: Secondary | ICD-10-CM | POA: Diagnosis not present

## 2022-09-17 DIAGNOSIS — Z79899 Other long term (current) drug therapy: Secondary | ICD-10-CM | POA: Diagnosis not present

## 2022-09-17 DIAGNOSIS — G4700 Insomnia, unspecified: Secondary | ICD-10-CM | POA: Diagnosis present

## 2022-09-17 DIAGNOSIS — Z7982 Long term (current) use of aspirin: Secondary | ICD-10-CM | POA: Diagnosis not present

## 2022-09-17 DIAGNOSIS — Z8673 Personal history of transient ischemic attack (TIA), and cerebral infarction without residual deficits: Secondary | ICD-10-CM | POA: Diagnosis not present

## 2022-09-17 DIAGNOSIS — J439 Emphysema, unspecified: Secondary | ICD-10-CM | POA: Diagnosis not present

## 2022-09-17 DIAGNOSIS — J4489 Other specified chronic obstructive pulmonary disease: Secondary | ICD-10-CM | POA: Diagnosis present

## 2022-09-17 DIAGNOSIS — Z1152 Encounter for screening for COVID-19: Secondary | ICD-10-CM

## 2022-09-17 DIAGNOSIS — Z91030 Bee allergy status: Secondary | ICD-10-CM

## 2022-09-17 DIAGNOSIS — Z882 Allergy status to sulfonamides status: Secondary | ICD-10-CM | POA: Diagnosis not present

## 2022-09-17 DIAGNOSIS — R4182 Altered mental status, unspecified: Secondary | ICD-10-CM | POA: Diagnosis not present

## 2022-09-17 DIAGNOSIS — Z9049 Acquired absence of other specified parts of digestive tract: Secondary | ICD-10-CM

## 2022-09-17 DIAGNOSIS — I6389 Other cerebral infarction: Secondary | ICD-10-CM | POA: Diagnosis not present

## 2022-09-17 DIAGNOSIS — F329 Major depressive disorder, single episode, unspecified: Secondary | ICD-10-CM | POA: Diagnosis present

## 2022-09-17 DIAGNOSIS — F333 Major depressive disorder, recurrent, severe with psychotic symptoms: Secondary | ICD-10-CM | POA: Diagnosis not present

## 2022-09-17 DIAGNOSIS — H547 Unspecified visual loss: Secondary | ICD-10-CM | POA: Diagnosis present

## 2022-09-17 DIAGNOSIS — F4310 Post-traumatic stress disorder, unspecified: Secondary | ICD-10-CM | POA: Diagnosis present

## 2022-09-17 DIAGNOSIS — Z8616 Personal history of COVID-19: Secondary | ICD-10-CM | POA: Diagnosis not present

## 2022-09-17 DIAGNOSIS — K769 Liver disease, unspecified: Secondary | ICD-10-CM | POA: Diagnosis not present

## 2022-09-17 DIAGNOSIS — Z8249 Family history of ischemic heart disease and other diseases of the circulatory system: Secondary | ICD-10-CM

## 2022-09-17 DIAGNOSIS — E119 Type 2 diabetes mellitus without complications: Secondary | ICD-10-CM | POA: Diagnosis present

## 2022-09-17 DIAGNOSIS — Z8619 Personal history of other infectious and parasitic diseases: Secondary | ICD-10-CM | POA: Diagnosis not present

## 2022-09-17 DIAGNOSIS — K7460 Unspecified cirrhosis of liver: Secondary | ICD-10-CM | POA: Diagnosis present

## 2022-09-17 DIAGNOSIS — I5032 Chronic diastolic (congestive) heart failure: Secondary | ICD-10-CM | POA: Diagnosis present

## 2022-09-17 DIAGNOSIS — K7682 Hepatic encephalopathy: Secondary | ICD-10-CM | POA: Diagnosis present

## 2022-09-17 DIAGNOSIS — Z6839 Body mass index (BMI) 39.0-39.9, adult: Secondary | ICD-10-CM | POA: Diagnosis not present

## 2022-09-17 DIAGNOSIS — E785 Hyperlipidemia, unspecified: Secondary | ICD-10-CM | POA: Diagnosis present

## 2022-09-17 DIAGNOSIS — R45851 Suicidal ideations: Secondary | ICD-10-CM | POA: Diagnosis present

## 2022-09-17 DIAGNOSIS — E860 Dehydration: Secondary | ICD-10-CM | POA: Diagnosis present

## 2022-09-17 DIAGNOSIS — H9190 Unspecified hearing loss, unspecified ear: Secondary | ICD-10-CM | POA: Diagnosis present

## 2022-09-17 DIAGNOSIS — G9341 Metabolic encephalopathy: Secondary | ICD-10-CM | POA: Diagnosis not present

## 2022-09-17 LAB — RESP PANEL BY RT-PCR (RSV, FLU A&B, COVID)  RVPGX2
Influenza A by PCR: NEGATIVE
Influenza B by PCR: NEGATIVE
Resp Syncytial Virus by PCR: NEGATIVE
SARS Coronavirus 2 by RT PCR: NEGATIVE

## 2022-09-17 LAB — CBG MONITORING, ED
Glucose-Capillary: 112 mg/dL — ABNORMAL HIGH (ref 70–99)
Glucose-Capillary: 66 mg/dL — ABNORMAL LOW (ref 70–99)
Glucose-Capillary: 126 mg/dL — ABNORMAL HIGH (ref 70–99)
Glucose-Capillary: 104 mg/dL — ABNORMAL HIGH (ref 70–99)

## 2022-09-17 LAB — GLUCOSE, CAPILLARY: Glucose-Capillary: 66 mg/dL — ABNORMAL LOW (ref 70–99)

## 2022-09-17 MED ORDER — EMPAGLIFLOZIN 10 MG PO TABS
10.0000 mg | ORAL_TABLET | Freq: Every day | ORAL | Status: DC
  Filled 2022-09-17: qty 1

## 2022-09-17 MED ORDER — INSULIN ASPART 100 UNIT/ML IJ SOLN: 4.0000 [IU] | Freq: Three times a day (TID) | INTRAMUSCULAR | Status: DC

## 2022-09-17 MED ORDER — CITALOPRAM HYDROBROMIDE 20 MG PO TABS
20.0000 mg | ORAL_TABLET | Freq: Every day | ORAL | Status: DC
  Administered 2022-09-18 – 2022-09-21 (×4): 20 mg via ORAL
  Filled 2022-09-17 (×4): qty 1

## 2022-09-17 MED ORDER — ALUM & MAG HYDROXIDE-SIMETH 200-200-20 MG/5ML PO SUSP: 30.0000 mL | ORAL | Status: DC | PRN

## 2022-09-17 MED ORDER — VITAMIN C 500 MG PO TABS
500.0000 mg | ORAL_TABLET | Freq: Every day | ORAL | Status: DC
  Administered 2022-09-18 – 2022-09-21 (×4): 500 mg via ORAL
  Filled 2022-09-17 (×4): qty 1

## 2022-09-17 MED ORDER — HYDROXYZINE HCL 10 MG PO TABS: 10.0000 mg | ORAL_TABLET | Freq: Three times a day (TID) | ORAL | Status: DC | PRN

## 2022-09-17 MED ORDER — ASPIRIN 81 MG PO TBEC
81.0000 mg | DELAYED_RELEASE_TABLET | Freq: Every day | ORAL | Status: DC
  Administered 2022-09-18 – 2022-09-21 (×4): 81 mg via ORAL
  Filled 2022-09-17 (×4): qty 1

## 2022-09-17 MED ORDER — ACETAMINOPHEN 325 MG PO TABS
650.0000 mg | ORAL_TABLET | Freq: Four times a day (QID) | ORAL | Status: DC | PRN
  Administered 2022-09-18 – 2022-09-19 (×2): 650 mg via ORAL
  Filled 2022-09-17 (×2): qty 2

## 2022-09-17 MED ORDER — LISINOPRIL 20 MG PO TABS
40.0000 mg | ORAL_TABLET | Freq: Every day | ORAL | Status: DC
  Administered 2022-09-18 – 2022-09-21 (×4): 40 mg via ORAL
  Filled 2022-09-17 (×4): qty 2

## 2022-09-17 MED ORDER — MAGNESIUM HYDROXIDE 400 MG/5ML PO SUSP: 30.0000 mL | Freq: Every day | ORAL | Status: DC | PRN

## 2022-09-17 MED ORDER — INSULIN ASPART 100 UNIT/ML IJ SOLN: 0.0000 [IU] | Freq: Three times a day (TID) | INTRAMUSCULAR | Status: DC

## 2022-09-17 MED ORDER — ATORVASTATIN CALCIUM 10 MG PO TABS
40.0000 mg | ORAL_TABLET | Freq: Every day | ORAL | Status: DC
  Administered 2022-09-18 – 2022-09-21 (×4): 40 mg via ORAL
  Filled 2022-09-17 (×4): qty 4

## 2022-09-17 NOTE — ED Notes (Signed)
Per [place] NP, repeat covid and flu testing needed for pt to go to gero.

## 2022-09-17 NOTE — BH Assessment (Signed)
Patient is to be admitted to [facility] on today 09/17/22 by Dr.  Louis Meckel .  Attending Physician will be Dr.  Louis Meckel .   Patient has been assigned to room l-29, by [place], Arlyss Repress.     ER staff is aware of the admission: Apolonio Schneiders, ER Secretary   Dr. Quentin Cornwall, ER MD  Earlie Server, Patient's Nurse

## 2022-09-17 NOTE — ED Notes (Signed)
VOL  GOING  TO  BEH  MED

## 2022-09-17 NOTE — ED Provider Notes (Signed)
Emergency Medicine Observation Re-evaluation Note  ORON WESTRUP is a 72 y.o. male, seen on rounds today.  Pt initially presented to the ED for complaints of Psychiatric Evaluation  Currently, the patient is is no acute distress. Denies any concerns at this time.  Physical Exam  Blood pressure (!) 138/56, pulse (!) 51, temperature 98.2 F (36.8 C), temperature source Oral, resp. rate 18, height '5\' 6"'$  (1.676 m), weight 111.1 kg, SpO2 98 %.  Physical Exam: General: No apparent distress Pulm: Normal WOB Neuro: Moving all extremities Psych: Resting comfortably     ED Course / MDM     I have reviewed the labs performed to date as well as medications administered while in observation.  Recent changes in the last 24 hours include: No acute events overnight.  Plan   Current plan: Patient awaiting psychiatric disposition. Patient is not under full IVC at this time.    Nathaniel Man, MD 09/17/22 [phone]

## 2022-09-17 NOTE — ED Notes (Signed)
vol/pending geropysch admit.Marland Kitchen

## 2022-09-17 NOTE — Progress Notes (Signed)
   09/17/22 1230  Spiritual Encounters  Type of Visit Initial  Care provided to: Patient  Referral source Chaplain assessment  Reason for visit Routine spiritual support  OnCall Visit No  Spiritual Framework  Presenting Themes Impactful experiences and emotions  Patient Stress Factors Loss of control  Family Stress Factors Family relationships  Interventions  Spiritual Care Interventions Made Narrative/life review;Explored ethical dilemma  Intervention Outcomes  Outcomes Awareness of support;Reduced fear  Spiritual Care Plan  Spiritual Care Issues Still Outstanding No further spiritual care needs at this time (see row info)   [name] took time to narrate and review his life in details. [name] was present and actively listened. Patient was also very emotional and grieving loss of control. Chap assured Pt off spiritual and emotional care 24 hrs. Pt requested a prayer and expressed his gratitude to the Chaplain.

## 2022-09-17 NOTE — Tx Team (Signed)
Initial Treatment Plan 09/17/2022 10:13 PM [name] ZOX:[id number]    PATIENT STRESSORS: Financial difficulties   Medication change or noncompliance     PATIENT STRENGTHS: Active sense of humor  Average or above average intelligence  Capable of independent living  Communication skills  Motivation for treatment/growth    PATIENT IDENTIFIED PROBLEMS: Depression  Homelessness                   DISCHARGE CRITERIA:  Ability to meet basic life and health needs Adequate post-discharge living arrangements Improved stabilization in mood, thinking, and/or behavior Verbal commitment to aftercare and medication compliance  PRELIMINARY DISCHARGE PLAN: Placement in alternative living arrangements  PATIENT/FAMILY INVOLVEMENT: This treatment plan has been presented to and reviewed with the patient, [name].  The patient has been given the opportunity to ask questions and make suggestions.  [name], RN 09/17/2022, 10:13 PM

## 2022-09-18 DIAGNOSIS — F333 Major depressive disorder, recurrent, severe with psychotic symptoms: Secondary | ICD-10-CM | POA: Diagnosis not present

## 2022-09-18 LAB — GLUCOSE, CAPILLARY
Glucose-Capillary: 64 mg/dL — ABNORMAL LOW (ref 70–99)
Glucose-Capillary: 152 mg/dL — ABNORMAL HIGH (ref 70–99)
Glucose-Capillary: 130 mg/dL — ABNORMAL HIGH (ref 70–99)
Glucose-Capillary: 94 mg/dL (ref 70–99)
Glucose-Capillary: 85 mg/dL (ref 70–99)

## 2022-09-18 MED ORDER — PANTOPRAZOLE SODIUM 40 MG PO TBEC
40.0000 mg | DELAYED_RELEASE_TABLET | Freq: Every day | ORAL | Status: DC
  Administered 2022-09-18 – 2022-09-21 (×4): 40 mg via ORAL
  Filled 2022-09-18 (×4): qty 1

## 2022-09-18 MED ORDER — FUROSEMIDE 20 MG PO TABS
20.0000 mg | ORAL_TABLET | Freq: Every day | ORAL | Status: DC
  Administered 2022-09-18 – 2022-09-21 (×4): 20 mg via ORAL
  Filled 2022-09-18 (×4): qty 1

## 2022-09-18 MED ORDER — INSULIN ASPART 100 UNIT/ML IJ SOLN: 0.0000 [IU] | Freq: Three times a day (TID) | INTRAMUSCULAR | Status: DC

## 2022-09-18 MED ORDER — EMPAGLIFLOZIN 10 MG PO TABS
10.0000 mg | ORAL_TABLET | Freq: Every day | ORAL | Status: DC
  Administered 2022-09-18 – 2022-09-21 (×4): 10 mg via ORAL
  Filled 2022-09-18 (×5): qty 1

## 2022-09-18 MED ORDER — BENZONATATE 100 MG PO CAPS
200.0000 mg | ORAL_CAPSULE | ORAL | Status: DC | PRN
  Administered 2022-09-19 – 2022-09-20 (×3): 200 mg via ORAL
  Filled 2022-09-18 (×4): qty 2

## 2022-09-18 MED ORDER — ALBUTEROL SULFATE (2.5 MG/3ML) 0.083% IN NEBU: 2.5000 mg | INHALATION_SOLUTION | RESPIRATORY_TRACT | Status: DC | PRN

## 2022-09-18 MED ORDER — OLANZAPINE 5 MG PO TABS: 10.0000 mg | ORAL_TABLET | Freq: Four times a day (QID) | ORAL | Status: DC | PRN

## 2022-09-18 MED ORDER — QUETIAPINE FUMARATE 25 MG PO TABS
50.0000 mg | ORAL_TABLET | Freq: Every day | ORAL | Status: DC
  Administered 2022-09-18 – 2022-09-21 (×4): 50 mg via ORAL
  Filled 2022-09-18 (×4): qty 2

## 2022-09-18 NOTE — [facility] Suicide Risk Assessment (Signed)
[facility] Admission Suicide Risk Assessment   Nursing information obtained from:  Patient Demographic factors:  Male, Age 72 or older, Caucasian Current Mental Status:  Self-harm thoughts Loss Factors:  Financial problems / change in socioeconomic status Historical Factors:  NA Risk Reduction Factors:  Positive coping skills or problem solving skills  Total Time spent with patient: 1 hour Principal Problem: MDD (major depressive disorder) Diagnosis:  Principal Problem:   MDD (major depressive disorder)  Subjective Data:  [name] is a 72 year old white male who was voluntarily admitted to inpatient psychiatry for suicidal ideation.  He is homeless and lives in a tent in [place].  He tells me that he has PTSD from his brief training in the [place] when he was 72 years old.  Apparently, someone was killed in front of him.  He is currently on disability.  He does have a Psychiatrist who lives in [place] but is also homeless.  He last had housing in 2018 but was evicted after the landlord was a fraud.  He sees Dr. Posey Pronto at the [facility] in [place].  He has a history of blindness, deaf, CHF, hypertension, liver and kidney disease, and type 2 diabetes.  He endorses anhedonia, difficulty sleeping, depressed mood, anxiety and hallucinations when he has flashbacks.  He was last psychiatrically hospitalized in the 1990s and does not have any past suicide attempts.  He currently does not have a psychiatrist.  He was seen in the emergency room recently for chest pain and a URI and given a prescription for Tessalon.  More recently seen by psychiatric nurse practitioner because he stated that he was going to cut his wrists.  He informs me that he does not do well on tricyclic antidepressants.   Continued Clinical Symptoms:    The "Alcohol Use Disorders Identification Test", Guidelines for Use in Primary Care, Second Edition.  World Pharmacologist [facility]). Score between 0-7:  no or low risk  or alcohol related problems. Score between 8-15:  moderate risk of alcohol related problems. Score between 16-19:  high risk of alcohol related problems. Score 20 or above:  warrants further diagnostic evaluation for alcohol dependence and treatment.   CLINICAL FACTORS:   Depression:   Anhedonia   Musculoskeletal: Strength & Muscle Tone: within normal limits Gait & Station: normal Patient leans: N/A  Psychiatric Specialty Exam:  Presentation  General Appearance:  Disheveled  Eye Contact: Good  Speech: Clear and Coherent  Speech Volume: Normal  Handedness: Right   Mood and Affect  Mood: Euphoric  Affect: Congruent   Thought Process  Thought Processes: Coherent  Descriptions of Associations:Intact  Orientation:Full (Time, Place and Person)  Thought Content:WDL  History of Schizophrenia/Schizoaffective disorder:No  Duration of Psychotic Symptoms:Less than six months  Hallucinations:No data recorded Ideas of Reference:None  Suicidal Thoughts:No data recorded Homicidal Thoughts:No data recorded  Sensorium  Memory: Immediate Good; Recent Good  Judgment: Fair  Insight: Poor   Executive Functions  Concentration: Good  Attention Span: Good  Recall: Good  Fund of Knowledge: Poor  Language: Fair   Psychomotor Activity  Psychomotor Activity:No data recorded  Assets  Assets: Financial Resources/Insurance; Resilience   Sleep  Sleep:No data recorded    Blood pressure 137/63, pulse (!) 52, temperature 98.3 F (36.8 C), resp. rate 17, height '5\' 6"'$  (1.676 m), weight 111 kg, SpO2 100 %. Body mass index is 39.5 kg/m.   COGNITIVE FEATURES THAT CONTRIBUTE TO RISK:  None    SUICIDE RISK:   Minimal: No identifiable  suicidal ideation.  Patients presenting with no risk factors but with morbid ruminations; may be classified as minimal risk based on the severity of the depressive symptoms  PLAN OF CARE: See orders  I certify that  inpatient services furnished can reasonably be expected to improve the patient's condition.   Parks Ranger, DO 09/18/2022, 11:49 AM

## 2022-09-18 NOTE — Plan of Care (Signed)
Pt denies depression however endorses anxiety at this time. Pt denies SI/HI/AVH however endorses chronic back pain at this time. Prn medications given. Pt is calm and cooperative. Pt is medication compliant. Pt provided with support and encouragement. Pt monitored q15 minutes for safety per unit policy. Plan of care ongoing.   Problem: Education: Goal: Knowledge of General Education information will improve Description: Including pain rating scale, medication(s)/side effects and non-pharmacologic comfort measures Outcome: Progressing   Problem: Nutrition: Goal: Adequate nutrition will be maintained Outcome: Progressing

## 2022-09-18 NOTE — Progress Notes (Signed)
Patient is a 72 y.o. caucasian male who came to ED via [place] with complain of hallucinations. Patient repots that, "A fella found me rolled up on me balled up in the streets screaming that someone was trying to stab me. It was as real as me in this room is". He reports that he hallucinates when he is stressed. He reports that he has been traumatized in the [employer] and in prison. He reports that he wanted to cut himself on his arm vertically, "that's how they taught [place] in the [place] to always go this way, that other way is bullshit". He has been stressed about being homeless. He reports crying all evening, and feeling worthless. He stated that "I should have been dead a long time ago. I have lived too long". He states that he cannot take it anymore. He states that he has a lot of bad memories. He shared that he has panic attacks constantly and it disrupts his breathing. He stated that he is constantly in a state of anxiety. He denied SI, HI and AVH at the moment. He reports that he is stressed by a lack of stability, lack of food, lack of a support system. Patient searched in exam room with two staff members, and skin assessed. Patient alert, and oriented x4, denies pain and discomfort. Patient oriented to the unit, now in bed appears asleep with unlabored respirations.

## 2022-09-18 NOTE — Inpatient Diabetes Management (Signed)
Inpatient Diabetes Program Recommendations  AACE/ADA: New Consensus Statement on Inpatient Glycemic Control   Target Ranges:  Prepandial:   less than 140 mg/dL      Peak postprandial:   less than 180 mg/dL (1-2 hours)      Critically ill patients:  140 - 180 mg/dL    Latest Reference Range & Units 09/17/22 08:30 09/17/22 09:22 09/17/22 11:46 09/17/22 16:28 09/17/22 21:24 09/18/22 08:03  Glucose-Capillary 70 - 99 mg/dL 66 (L) 112 (H) 126 (H) 104 (H) 66 (L) 64 (L)    Latest Reference Range & Units 03/26/22 04:45 09/15/22 07:31  Hemoglobin A1C 4.8 - 5.6 % 5.4 4.9   Review of Glycemic Control  Diabetes history: DM2 Outpatient Diabetes medications: Metformin XR 500 mg daily, Jardiance 10 mg daily Current orders for Inpatient glycemic control: Novolog 0-15 units TID with meals, Novolog 4 units TID with meals, Jardiance 10 mg daily  Inpatient Diabetes Program Recommendations:    Oral DM meds: Please discontinue Jardiance due to noted hypoglycemia.  Insulin: Please decrease Novolog correction scale to 0-9 units TID with meals.  HbgA1C:  A1C 4.9% on 09/15/22 indicating an average glucose of 94 mg/dl over the past 2-3 months. May need to re-evaluate DM medications at discharge given low A1C and noted hypoglycemia while inpatient.  Thanks, Barnie Alderman, RN, MSN, [place] Diabetes Coordinator Inpatient Diabetes Program [phone] (Team Pager from 8am to [place])

## 2022-09-18 NOTE — Group Note (Signed)
LCSW Group Therapy Note   Group Date: 09/18/2022 Start Time: 1300 End Time: 1330   Type of Therapy and Topic:  Group Therapy: Boundaries  Participation Level:  Active  Description of Group: This group will address the use of boundaries in their personal lives. Patients will explore why boundaries are important, the difference between healthy and unhealthy boundaries, and negative and postive outcomes of different boundaries and will look at how boundaries can be crossed.  Patients will be encouraged to identify current boundaries in their own lives and identify what kind of boundary is being set. Facilitators will guide patients in utilizing problem-solving interventions to address and correct types boundaries being used and to address when no boundary is being used. Understanding and applying boundaries will be explored and addressed for obtaining and maintaining a balanced life. Patients will be encouraged to explore ways to assertively make their boundaries and needs known to significant others in their lives, using other group members and facilitator for role play, support, and feedback.  Therapeutic Goals:  1.  Patient will identify areas in their life where setting clear boundaries could be  used to improve their life.  2.  Patient will identify signs/triggers that a boundary is not being respected. 3.  Patient will identify two ways to set boundaries in order to achieve balance in  their lives: 4.  Patient will demonstrate ability to communicate their needs and set boundaries  through discussion and/or role plays  Summary of Patient Progress:  He was moderately present/active throughout the session and proved open to feedback from [place] and peers. Patient had low to normal mood. Patient demonstrated fair insight into the subject matter, was respectful of peers, and was not present throughout the entire session. Patient left group early for nursing needs.   Therapeutic Modalities:    Cognitive Behavioral Therapy Solution-Focused Therapy  Bertrice Leder A [place], LCSWA 09/18/2022  1:37 PM

## 2022-09-18 NOTE — H&P (Signed)
Psychiatric Admission Assessment Adult  Patient Identification: [name] MRN:  [id number] Date of Evaluation:  09/18/2022 Chief Complaint:  MDD (major depressive disorder) [F32.9] Principal Diagnosis: MDD (major depressive disorder) Diagnosis:  Principal Problem:   MDD (major depressive disorder)  History of Present Illness: [name] is a 72 year old white male who was voluntarily admitted to inpatient psychiatry for suicidal ideation.  He is homeless and lives in a tent in [place].  He tells me that he has PTSD from his brief training in the [place] when he was 72 years old.  Apparently, someone was killed in front of him.  He is currently on disability.  He does have a Psychiatrist who lives in [place] but is also homeless.  He last had housing in 2018 but was evicted after the landlord was a fraud.  He sees Dr. Posey Pronto at the [facility] in [place].  He has a history of blindness, deaf, CHF, hypertension, liver and kidney disease, and type 2 diabetes.  He endorses anhedonia, difficulty sleeping, depressed mood, anxiety and hallucinations when he has flashbacks.  He was last psychiatrically hospitalized in the 1990s and does not have any past suicide attempts.  He currently does not have a psychiatrist.  He was seen in the emergency room recently for chest pain and a URI and given a prescription for Tessalon.  More recently seen by psychiatric nurse practitioner because he stated that he was going to cut his wrists.  He informs me that he does not do well on tricyclic antidepressants.  Associated Signs/Symptoms: Depression Symptoms:  depressed mood, anhedonia, insomnia, suicidal thoughts with specific plan, (Hypo) Manic Symptoms:   None Anxiety Symptoms:  Excessive Worry, Psychotic Symptoms:  Paranoia, PTSD Symptoms: Re-experiencing:  Flashbacks Total Time spent with patient: 1 hour  Past Psychiatric History: As above  Is the patient at risk to self? Yes.    Has  the patient been a risk to self in the past 6 months? Yes.    Has the patient been a risk to self within the distant past? Yes.    Is the patient a risk to others? No.  Has the patient been a risk to others in the past 6 months? No.  Has the patient been a risk to others within the distant past? No.   [place] Scale:  [name] Admission (Current) from 09/17/2022 in [place] ED from 09/15/2022 in [facility] Emergency Department at [facility] ED from 09/14/2022 in [facility] Emergency Department at [place] No Risk No Risk No Risk        Prior Inpatient Therapy: Yes.   If yes, describe 1990s Prior Outpatient Therapy: Yes.   If yes, describe [facility]  Alcohol Screening:   Substance Abuse History in the last 12 months: Marijuana Consequences of Substance Abuse: NA Previous Psychotropic Medications: Yes  Psychological Evaluations: Yes  Past Medical History:  Past Medical History:  Diagnosis Date   Asthma    CHF (congestive heart failure) (HCC)    COPD (chronic obstructive pulmonary disease) ([place])    COVID-19 03/2020   diagnosed in August 2021   Diabetes mellitus without complication ([place])    Homelessness    Hypertension    Infestation by bed bug    Migraine    Obesity    Sleep apnea     Past Surgical History:  Procedure Laterality Date   CARDIAC CATHETERIZATION     CHOLECYSTECTOMY     EYE SURGERY  INNER EAR SURGERY     NOSE SURGERY     Family History:  Family History  Problem Relation Age of Onset   Hypertension Mother    Heart disease Mother    Heart failure Maternal Grandmother    Family Psychiatric  History: Unremarkable Tobacco Screening:  Social History   Tobacco Use  Smoking Status Former  Smokeless Tobacco Never    BH Tobacco Counseling     Are you interested in Tobacco Cessation Medications?  No value filed. Counseled patient on smoking cessation:  No value filed. Reason Tobacco  Screening Not Completed: No value filed.       Social History:  Social History   Substance and Sexual Activity  Alcohol Use Never     Social History   Substance and Sexual Activity  Drug Use Never    Additional Social History:                           Allergies:   Allergies  Allergen Reactions   Bee Venom Anaphylaxis   Codeine Itching    Only itching. Recently allergy tested at [facility]   Novocain [Procaine]    Sulfa Antibiotics Other (See Comments)   Lab Results:  Results for orders placed or performed during the hospital encounter of 09/17/22 (from the past 48 hour(s))  Glucose, capillary     Status: Abnormal   Collection Time: 09/17/22  9:24 PM  Result Value Ref Range   Glucose-Capillary 66 (L) 70 - 99 mg/dL    Comment: Glucose reference range applies only to samples taken after fasting for at least 8 hours.  Glucose, capillary     Status: Abnormal   Collection Time: 09/18/22  8:03 AM  Result Value Ref Range   Glucose-Capillary 64 (L) 70 - 99 mg/dL    Comment: Glucose reference range applies only to samples taken after fasting for at least 8 hours.  Glucose, capillary     Status: Abnormal   Collection Time: 09/18/22  9:16 AM  Result Value Ref Range   Glucose-Capillary 152 (H) 70 - 99 mg/dL    Comment: Glucose reference range applies only to samples taken after fasting for at least 8 hours.    Blood Alcohol level:  Lab Results  Component Value Date   ETH <10 09/15/2022   ETH <10 24/40/1027    Metabolic Disorder Labs:  Lab Results  Component Value Date   HGBA1C 4.9 09/15/2022   MPG 93.93 09/15/2022   MPG 108.28 03/26/2022   No results found for: "PROLACTIN" Lab Results  Component Value Date   CHOL 92 04/29/2022   TRIG 51 04/29/2022   HDL 25 (L) 04/29/2022   CHOLHDL 3.7 04/29/2022   VLDL 10 04/29/2022   LDLCALC 57 04/29/2022   LDLCALC 100 (H) 10/04/2021    Current Medications: Current Facility-Administered Medications  Medication Dose  Route Frequency Provider Last Rate Last Admin   acetaminophen (TYLENOL) tablet 650 mg  650 mg Oral Q6H PRN Bennett, Christal H, NP   650 mg at 09/18/22 0942   albuterol (VENTOLIN HFA) 108 (90 Base) MCG/ACT inhaler 2 puff  2 puff Inhalation Q4H PRN Parks Ranger, DO       alum & mag hydroxide-simeth (MAALOX/MYLANTA) 200-200-20 MG/5ML suspension 30 mL  30 mL Oral Q4H PRN Bennett, Christal H, NP       ascorbic acid (VITAMIN C) tablet 500 mg  500 mg Oral Daily Bennett, Christal H, NP  500 mg at 09/18/22 5366   aspirin EC tablet 81 mg  81 mg Oral Daily Bennett, Christal H, NP   81 mg at 09/18/22 0929   atorvastatin (LIPITOR) tablet 40 mg  40 mg Oral Daily Bennett, Christal H, NP   40 mg at 09/18/22 4403   benzonatate (TESSALON) capsule 200 mg  200 mg Oral Q4H PRN Parks Ranger, DO       citalopram (CELEXA) tablet 20 mg  20 mg Oral Daily Bennett, Christal H, NP   20 mg at 09/18/22 4742   empagliflozin (JARDIANCE) tablet 10 mg  10 mg Oral Daily Parks Ranger, DO       furosemide (LASIX) tablet 20 mg  20 mg Oral Daily Parks Ranger, DO       hydrOXYzine (ATARAX) tablet 10 mg  10 mg Oral TID PRN Bennett, Christal H, NP       insulin aspart (novoLOG) injection 0-6 Units  0-6 Units Subcutaneous TID WC Parks Ranger, DO       lisinopril (ZESTRIL) tablet 40 mg  40 mg Oral Daily Bennett, Christal H, NP   40 mg at 09/18/22 0937   magnesium hydroxide (MILK OF MAGNESIA) suspension 30 mL  30 mL Oral Daily PRN Bennett, Christal H, NP       OLANZapine (ZYPREXA) tablet 10 mg  10 mg Oral Q6H PRN Parks Ranger, DO       pantoprazole (PROTONIX) EC tablet 40 mg  40 mg Oral Daily Ayleah Hofmeister Edward, DO       QUEtiapine (SEROQUEL) tablet 50 mg  50 mg Oral QHS Parks Ranger, DO       PTA Medications: Medications Prior to Admission  Medication Sig Dispense Refill Last Dose   albuterol (PROVENTIL) (2.5 MG/3ML) 0.083% nebulizer solution Take 3 mLs by  nebulization every 4 (four) hours as needed for wheezing or shortness of breath. 75 mL 0    albuterol (VENTOLIN HFA) 108 (90 Base) MCG/ACT inhaler Inhale 2 puffs into the lungs every 6 (six) hours as needed for wheezing or shortness of breath. (Patient not taking: Reported on 09/17/2022) 1 each 5    ascorbic acid (VITAMIN C) 500 MG tablet Take 1 tablet (500 mg total) by mouth daily.      aspirin EC 81 MG tablet Take 1 tablet (81 mg total) by mouth daily. Swallow whole. 30 tablet 5    atorvastatin (LIPITOR) 40 MG tablet Take 1 tablet (40 mg total) by mouth daily. (Patient not taking: Reported on 09/15/2022) 30 tablet 5    EPINEPHrine 0.3 mg/0.3 mL IJ SOAJ injection Inject 0.3 mg into the muscle as needed.      furosemide (LASIX) 20 MG tablet Take 20 mg by mouth every morning.      glucosamine-chondroitin 500-400 MG tablet Take 1 tablet by mouth 3 (three) times daily.      hydrOXYzine (ATARAX) 10 MG tablet Take 10 mg by mouth 3 (three) times daily as needed.      JARDIANCE 10 MG TABS tablet Take 1 tablet (10 mg total) by mouth daily. 30 tablet 5    lansoprazole (PREVACID) 30 MG capsule Take 30 mg by mouth daily.      lisinopril (ZESTRIL) 40 MG tablet Take 1 tablet (40 mg total) by mouth daily. 30 tablet 5    metFORMIN (GLUCOPHAGE-XR) 500 MG 24 hr tablet Take 1 tablet (500 mg total) by mouth daily. 30 tablet 5    Multiple Vitamin (MULTIVITAMIN WITH  MINERALS) TABS tablet Take 1 tablet by mouth daily.      nitroGLYCERIN (NITROSTAT) 0.4 MG SL tablet Place 1 tablet (0.4 mg total) under the tongue every 5 (five) minutes x 3 doses as needed for chest pain. 30 tablet 5     Musculoskeletal: Strength & Muscle Tone: within normal limits Gait & Station: normal Patient leans: N/A            Psychiatric Specialty Exam:  Presentation  General Appearance:  Disheveled  Eye Contact: Good  Speech: Clear and Coherent  Speech Volume: Normal  Handedness: Right   Mood and Affect   Mood: Euphoric  Affect: Congruent   Thought Process  Thought Processes: Coherent  Duration of Psychotic Symptoms: 1 month Past Diagnosis of Schizophrenia or Psychoactive disorder: No  Descriptions of Associations:Intact  Orientation:Full (Time, Place and Person)  Thought Content:WDL  Hallucinations:No data recorded Ideas of Reference:None  Suicidal Thoughts:No data recorded Homicidal Thoughts:No data recorded  Sensorium  Memory: Immediate Good; Recent Good  Judgment: Fair  Insight: Poor   Executive Functions  Concentration: Good  Attention Span: Good  Recall: Good  Fund of Knowledge: Poor  Language: Fair   Psychomotor Activity  Psychomotor Activity:No data recorded  Assets  Assets: Financial Resources/Insurance; Resilience   Sleep  Sleep:No data recorded   Physical Exam: Physical Exam Vitals and nursing note reviewed.  Constitutional:      Appearance: Normal appearance. He is normal weight.  HENT:     Head: Normocephalic and atraumatic.     Nose: Nose normal.     Mouth/Throat:     Pharynx: Oropharynx is clear.  Eyes:     Extraocular Movements: Extraocular movements intact.     Pupils: Pupils are equal, round, and reactive to light.  Cardiovascular:     Rate and Rhythm: Normal rate and regular rhythm.     Pulses: Normal pulses.     Heart sounds: Normal heart sounds.  Pulmonary:     Effort: Pulmonary effort is normal.     Breath sounds: Normal breath sounds.  Abdominal:     General: Abdomen is flat. Bowel sounds are normal.     Palpations: Abdomen is soft.  Genitourinary:    Penis: Normal.      Testes: Normal.     Rectum: Normal.  Musculoskeletal:        General: Normal range of motion.     Cervical back: Normal range of motion and neck supple.  Skin:    General: Skin is warm and dry.  Neurological:     General: No focal deficit present.     Mental Status: He is alert and oriented to person, place, and time.   Psychiatric:        Attention and Perception: Attention normal. He perceives auditory hallucinations.        Mood and Affect: Mood is depressed. Affect is flat.        Speech: Speech normal.        Behavior: Behavior normal. Behavior is cooperative.        Thought Content: Thought content normal.        Cognition and Memory: Cognition and memory normal.        Judgment: Judgment is impulsive and inappropriate.    Review of Systems  Constitutional: Negative.   HENT: Negative.    Eyes: Negative.   Respiratory: Negative.    Cardiovascular: Negative.   Gastrointestinal: Negative.   Genitourinary: Negative.   Musculoskeletal: Negative.   Skin: Negative.  Neurological: Negative.   Endo/Heme/Allergies: Negative.   Psychiatric/Behavioral:  Positive for depression and suicidal ideas.    Blood pressure 137/63, pulse (!) 52, temperature 98.3 F (36.8 C), resp. rate 17, height '5\' 6"'$  (1.676 m), weight 111 kg, SpO2 100 %. Body mass index is 39.5 kg/m.  Treatment Plan Summary: Daily contact with patient to assess and evaluate symptoms and progress in treatment, Medication management, and Plan see orders  Observation Level/Precautions:  15 minute checks  Laboratory:  CBC Chemistry Profile  Psychotherapy:    Medications:    Consultations:    Discharge Concerns:    Estimated LOS:  Other:     Physician Treatment Plan for Primary Diagnosis: MDD (major depressive disorder) Long Term Goal(s): Improvement in symptoms so as ready for discharge  Short Term Goals: Ability to identify changes in lifestyle to reduce recurrence of condition will improve, Ability to verbalize feelings will improve, Ability to disclose and discuss suicidal ideas, Ability to demonstrate self-control will improve, Ability to identify and develop effective coping behaviors will improve, Ability to maintain clinical measurements within normal limits will improve, Compliance with prescribed medications will improve, and  Ability to identify triggers associated with substance abuse/mental health issues will improve  Physician Treatment Plan for Secondary Diagnosis: Principal Problem:   MDD (major depressive disorder)    I certify that inpatient services furnished can reasonably be expected to improve the patient's condition.    Parks Ranger, DO 1/23/202411:38 AM

## 2022-09-19 DIAGNOSIS — F333 Major depressive disorder, recurrent, severe with psychotic symptoms: Secondary | ICD-10-CM | POA: Diagnosis not present

## 2022-09-19 LAB — GLUCOSE, CAPILLARY
Glucose-Capillary: 64 mg/dL — ABNORMAL LOW (ref 70–99)
Glucose-Capillary: 112 mg/dL — ABNORMAL HIGH (ref 70–99)
Glucose-Capillary: 103 mg/dL — ABNORMAL HIGH (ref 70–99)
Glucose-Capillary: 82 mg/dL (ref 70–99)

## 2022-09-19 NOTE — Progress Notes (Signed)
   09/19/22 0200  Psych Admission Type (Psych Patients Only)  Admission Status Voluntary  Psychosocial Assessment  Patient Complaints Anxiety  Eye Contact Fair  Facial Expression Other (Comment) (WDL)  Affect Appropriate to circumstance  Speech Logical/coherent  Interaction Assertive  Motor Activity Slow  Appearance/Hygiene Unremarkable;In scrubs  Behavior Characteristics Cooperative;Appropriate to situation  Mood Pleasant;Depressed  Thought Process  Coherency WDL  Content Blaming others  Delusions None reported or observed  Perception WDL  Hallucination None reported or observed  Judgment WDL  Confusion WDL  Danger to Self  Current suicidal ideation? Denies  Danger to Others  Danger to Others None reported or observed

## 2022-09-19 NOTE — B[facility] Group Notes (Signed)
[place] Group Notes:  (Nursing/MHT/Case Management/Adjunct)  Date:  09/19/2022  Time:  12:35 PM  Type of Therapy:  Movement Therapy  Participation Level:  Minimal  Participation Quality:  Appropriate  Affect:  Depressed  Cognitive:  Appropriate  Insight:  Appropriate  Engagement in Group:  Limited  Modes of Intervention:  Activity  Summary of Progress/Problems:  [name] 09/19/2022, 12:35 PM

## 2022-09-19 NOTE — Plan of Care (Signed)
  Problem: Education: Goal: Knowledge of General Education information will improve Description: Including pain rating scale, medication(s)/side effects and non-pharmacologic comfort measures Outcome: Progressing   Problem: Health Behavior/Discharge Planning: Goal: Ability to manage health-related needs will improve Outcome: Progressing   Problem: Clinical Measurements: Goal: Ability to maintain clinical measurements within normal limits will improve Outcome: Progressing Goal: Will remain free from infection Outcome: Progressing Goal: Diagnostic test results will improve Outcome: Progressing Goal: Respiratory complications will improve Outcome: Progressing Goal: Cardiovascular complication will be avoided Outcome: Progressing   Problem: Activity: Goal: Risk for activity intolerance will decrease Outcome: Progressing   Problem: Nutrition: Goal: Adequate nutrition will be maintained Outcome: Progressing   Problem: Coping: Goal: Level of anxiety will decrease Outcome: Progressing   Problem: Elimination: Goal: Will not experience complications related to bowel motility Outcome: Progressing Goal: Will not experience complications related to urinary retention Outcome: Progressing   Problem: Pain Managment: Goal: General experience of comfort will improve Outcome: Progressing   Problem: Safety: Goal: Ability to remain free from injury will improve Outcome: Progressing   Problem: Skin Integrity: Goal: Risk for impaired skin integrity will decrease Outcome: Progressing   Problem: Health Behavior/Discharge Planning: Goal: Ability to identify and utilize available resources and services will improve Outcome: Progressing Goal: Ability to manage health-related needs will improve Outcome: Progressing   Problem: Self-Concept: Goal: Ability to disclose and discuss suicidal ideas will improve Outcome: Progressing Goal: Will verbalize positive feelings about self Outcome:  Progressing   Problem: Medication: Goal: Compliance with prescribed medication regimen will improve Outcome: Progressing   Problem: Health Behavior/Discharge Planning: Goal: Identification of resources available to assist in meeting health care needs will improve Outcome: Progressing

## 2022-09-19 NOTE — Group Note (Signed)
[facility] LCSW Group Therapy Note   Group Date: 09/19/2022 Start Time: 6578 End Time: 1245  Type of Therapy/Topic:  Group Therapy:  Feelings about Diagnosis  Participation Level:  Did Not Attend   Mood:    Description of Group:    This group will allow patients to explore their thoughts and feelings about diagnoses they have received. Patients will be guided to explore their level of understanding and acceptance of these diagnoses. Facilitator will encourage patients to process their thoughts and feelings about the reactions of others to their diagnosis, and will guide patients in identifying ways to discuss their diagnosis with significant others in their lives. This group will be process-oriented, with patients participating in exploration of their own experiences as well as giving and receiving support and challenge from other group members.   Therapeutic Goals: 1. Patient will demonstrate understanding of diagnosis as evidence by identifying two or more symptoms of the disorder:  2. Patient will be able to express two feelings regarding the diagnosis 3. Patient will demonstrate ability to communicate their needs through discussion and/or role plays  Summary of Patient Progress:   X    Therapeutic Modalities:   Cognitive Behavioral Therapy Brief Therapy Feelings Identification    [place], LCSWA

## 2022-09-19 NOTE — Inpatient Diabetes Management (Signed)
Inpatient Diabetes Program Recommendations  AACE/ADA: New Consensus Statement on Inpatient Glycemic Control (2015)  Target Ranges:  Prepandial:   less than 140 mg/dL      Peak postprandial:   less than 180 mg/dL (1-2 hours)      Critically ill patients:  140 - 180 mg/dL    Latest Reference Range & Units 09/18/22 08:03 09/18/22 09:16 09/18/22 13:27 09/18/22 16:08 09/18/22 21:24  Glucose-Capillary 70 - 99 mg/dL 64 (L) 152 (H) 130 (H) 94 85  (L): Data is abnormally low (H): Data is abnormally high  Latest Reference Range & Units 09/19/22 08:07  Glucose-Capillary 70 - 99 mg/dL 64 (L)  (L): Data is abnormally low     Home DM Meds: Metformin XR 500 mg daily        Jardiance 10 mg daily  Current Orders: Novolog 0-6 units TID     Jardiance 10 mg daily   MD- Please discontinue Jardiance due to noted hypoglycemia     --Will follow patient during hospitalization--  Wyn Quaker RN, MSN, [place] Diabetes Coordinator Inpatient Glycemic Control Team Team Pager: [phone] (8a-5p)
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
## 2022-09-19 NOTE — Progress Notes (Signed)
   09/19/22 0726  Psych Admission Type (Psych Patients Only)  Admission Status Voluntary  Psychosocial Assessment  Patient Complaints Anxiety  Eye Contact Fair  Facial Expression Blank  Affect Appropriate to circumstance  Speech Logical/coherent  Interaction Assertive  Motor Activity Slow  Appearance/Hygiene Bizarre;Disheveled  Behavior Characteristics Cooperative  Mood Pleasant  Thought Process  Coherency WDL  Content WDL  Delusions None reported or observed  Perception WDL  Hallucination None reported or observed  Judgment WDL  Confusion None  Danger to Self  Current suicidal ideation? Denies  Danger to Others  Danger to Others None reported or observed
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
## 2022-09-19 NOTE — Progress Notes (Signed)
Patient is alert and oriented times 4. Mood and affect appropriate. Patient denies pain. He denies SI, HI, and AVH. Also denies feelings of anxiety and depression at this time. States he slept good last night. Morning meds given whole by mouth W/O difficulty. Ate breakfast in day room- appetite good. Patient remains on unit with Q15 minute checks in place.

## 2022-09-19 NOTE — BH IP Treatment Plan (Signed)
Interdisciplinary Treatment and Diagnostic Plan Update  09/19/2022 Time of Session: 9:00AM [name] MRN: [id number]  Principal Diagnosis: MDD (major depressive disorder)  Secondary Diagnoses: Principal Problem:   MDD (major depressive disorder)   Current Medications:  Current Facility-Administered Medications  Medication Dose Route Frequency Provider Last Rate Last Admin   acetaminophen (TYLENOL) tablet 650 mg  650 mg Oral Q6H PRN Bennett, Christal H, NP   650 mg at 09/18/22 0942   albuterol (PROVENTIL) (2.5 MG/3ML) 0.083% nebulizer solution 2.5 mg  2.5 mg Inhalation Q4H PRN Parks Ranger, DO       alum & mag hydroxide-simeth (MAALOX/MYLANTA) 200-200-20 MG/5ML suspension 30 mL  30 mL Oral Q4H PRN Bennett, Christal H, NP       ascorbic acid (VITAMIN C) tablet 500 mg  500 mg Oral Daily Bennett, Christal H, NP   500 mg at 09/19/22 0906   aspirin EC tablet 81 mg  81 mg Oral Daily Bennett, Christal H, NP   81 mg at 09/19/22 0907   atorvastatin (LIPITOR) tablet 40 mg  40 mg Oral Daily Bennett, Christal H, NP   40 mg at 09/19/22 3149   benzonatate (TESSALON) capsule 200 mg  200 mg Oral Q4H PRN Parks Ranger, DO       citalopram (CELEXA) tablet 20 mg  20 mg Oral Daily Bennett, Christal H, NP   20 mg at 09/19/22 7026   empagliflozin (JARDIANCE) tablet 10 mg  10 mg Oral Daily Parks Ranger, DO   10 mg at 09/19/22 3785   furosemide (LASIX) tablet 20 mg  20 mg Oral Daily Parks Ranger, DO   20 mg at 09/19/22 8850   hydrOXYzine (ATARAX) tablet 10 mg  10 mg Oral TID PRN Richardson Landry, Christal H, NP       insulin aspart (novoLOG) injection 0-6 Units  0-6 Units Subcutaneous TID WC Parks Ranger, DO       lisinopril (ZESTRIL) tablet 40 mg  40 mg Oral Daily Bennett, Christal H, NP   40 mg at 09/19/22 0906   magnesium hydroxide (MILK OF MAGNESIA) suspension 30 mL  30 mL Oral Daily PRN Bennett, Christal H, NP       OLANZapine (ZYPREXA) tablet 10 mg  10 mg  Oral Q6H PRN Parks Ranger, DO       pantoprazole (PROTONIX) EC tablet 40 mg  40 mg Oral Daily Parks Ranger, DO   40 mg at 09/19/22 2774   QUEtiapine (SEROQUEL) tablet 50 mg  50 mg Oral QHS Parks Ranger, DO   50 mg at 09/18/22 2120   PTA Medications: Medications Prior to Admission  Medication Sig Dispense Refill Last Dose   albuterol (PROVENTIL) (2.5 MG/3ML) 0.083% nebulizer solution Take 3 mLs by nebulization every 4 (four) hours as needed for wheezing or shortness of breath. 75 mL 0    albuterol (VENTOLIN HFA) 108 (90 Base) MCG/ACT inhaler Inhale 2 puffs into the lungs every 6 (six) hours as needed for wheezing or shortness of breath. (Patient not taking: Reported on 09/17/2022) 1 each 5    ascorbic acid (VITAMIN C) 500 MG tablet Take 1 tablet (500 mg total) by mouth daily.      aspirin EC 81 MG tablet Take 1 tablet (81 mg total) by mouth daily. Swallow whole. 30 tablet 5    atorvastatin (LIPITOR) 40 MG tablet Take 1 tablet (40 mg total) by mouth daily. (Patient not taking: Reported on 09/15/2022) 30 tablet 5  EPINEPHrine 0.3 mg/0.3 mL IJ SOAJ injection Inject 0.3 mg into the muscle as needed.      furosemide (LASIX) 20 MG tablet Take 20 mg by mouth every morning.      glucosamine-chondroitin 500-400 MG tablet Take 1 tablet by mouth 3 (three) times daily.      hydrOXYzine (ATARAX) 10 MG tablet Take 10 mg by mouth 3 (three) times daily as needed.      JARDIANCE 10 MG TABS tablet Take 1 tablet (10 mg total) by mouth daily. 30 tablet 5    lansoprazole (PREVACID) 30 MG capsule Take 30 mg by mouth daily.      lisinopril (ZESTRIL) 40 MG tablet Take 1 tablet (40 mg total) by mouth daily. 30 tablet 5    metFORMIN (GLUCOPHAGE-XR) 500 MG 24 hr tablet Take 1 tablet (500 mg total) by mouth daily. 30 tablet 5    Multiple Vitamin (MULTIVITAMIN WITH MINERALS) TABS tablet Take 1 tablet by mouth daily.      nitroGLYCERIN (NITROSTAT) 0.4 MG SL tablet Place 1 tablet (0.4 mg total)  under the tongue every 5 (five) minutes x 3 doses as needed for chest pain. 30 tablet 5     Patient Stressors: Financial difficulties   Medication change or noncompliance    Patient Strengths: Active sense of humor  Average or above average intelligence  Capable of independent living  Communication skills  Motivation for treatment/growth   Treatment Modalities: Medication Management, Group therapy, Case management,  1 to 1 session with clinician, Psychoeducation, Recreational therapy.   Physician Treatment Plan for Primary Diagnosis: MDD (major depressive disorder) Long Term Goal(s): Improvement in symptoms so as ready for discharge   Short Term Goals: Ability to identify changes in lifestyle to reduce recurrence of condition will improve Ability to verbalize feelings will improve Ability to disclose and discuss suicidal ideas Ability to demonstrate self-control will improve Ability to identify and develop effective coping behaviors will improve Ability to maintain clinical measurements within normal limits will improve Compliance with prescribed medications will improve Ability to identify triggers associated with substance abuse/mental health issues will improve  Medication Management: Evaluate patient's response, side effects, and tolerance of medication regimen.  Therapeutic Interventions: 1 to 1 sessions, Unit Group sessions and Medication administration.  Evaluation of Outcomes: Not Met  Physician Treatment Plan for Secondary Diagnosis: Principal Problem:   MDD (major depressive disorder)  Long Term Goal(s): Improvement in symptoms so as ready for discharge   Short Term Goals: Ability to identify changes in lifestyle to reduce recurrence of condition will improve Ability to verbalize feelings will improve Ability to disclose and discuss suicidal ideas Ability to demonstrate self-control will improve Ability to identify and develop effective coping behaviors will  improve Ability to maintain clinical measurements within normal limits will improve Compliance with prescribed medications will improve Ability to identify triggers associated with substance abuse/mental health issues will improve     Medication Management: Evaluate patient's response, side effects, and tolerance of medication regimen.  Therapeutic Interventions: 1 to 1 sessions, Unit Group sessions and Medication administration.  Evaluation of Outcomes: Not Met   RN Treatment Plan for Primary Diagnosis: MDD (major depressive disorder) Long Term Goal(s): Knowledge of disease and therapeutic regimen to maintain health will improve  Short Term Goals: Ability to remain free from injury will improve, Ability to verbalize frustration and anger appropriately will improve, Ability to demonstrate self-control, Ability to participate in decision making will improve, Ability to verbalize feelings will improve, Ability to disclose  and discuss suicidal ideas, Ability to identify and develop effective coping behaviors will improve, and Compliance with prescribed medications will improve  Medication Management: RN will administer medications as ordered by provider, will assess and evaluate patient's response and provide education to patient for prescribed medication. RN will report any adverse and/or side effects to prescribing provider.  Therapeutic Interventions: 1 on 1 counseling sessions, Psychoeducation, Medication administration, Evaluate responses to treatment, Monitor vital signs and CBGs as ordered, Perform/monitor CIWA, COWS, AIMS and Fall Risk screenings as ordered, Perform wound care treatments as ordered.  Evaluation of Outcomes: Not Met   LCSW Treatment Plan for Primary Diagnosis: MDD (major depressive disorder) Long Term Goal(s): Safe transition to appropriate next level of care at discharge, Engage patient in therapeutic group addressing interpersonal concerns.  Short Term Goals: Engage  patient in aftercare planning with referrals and resources, Increase social support, Increase ability to appropriately verbalize feelings, Increase emotional regulation, Facilitate acceptance of mental health diagnosis and concerns, and Increase skills for wellness and recovery  Therapeutic Interventions: Assess for all discharge needs, 1 to 1 time with Social worker, Explore available resources and support systems, Assess for adequacy in community support network, Educate family and significant other(s) on suicide prevention, Complete Psychosocial Assessment, Interpersonal group therapy.  Evaluation of Outcomes: Not Met   Progress in Treatment: Attending groups: Yes. Participating in groups: Yes. Taking medication as prescribed: Yes. Toleration medication: Yes. Family/Significant other contact made: No, will contact:  pt declined collateral contact. SPE completed with pt Patient understands diagnosis: Yes. Discussing patient identified problems/goals with staff: Yes. Medical problems stabilized or resolved: Yes. Denies suicidal/homicidal ideation: Yes. Issues/concerns per patient self-inventory: No. Other: None  New problem(s) identified: No, Describe:  None  New Short Term/Long Term Goal(s): Patient to work towards , medication management for mood stabilization; elimination of SI thoughts; development of comprehensive mental wellness plan.   Patient Goals:  "feel better, find housing"  Discharge Plan or Barriers: CSW will assist pt with development of appropriate discharge/aftercare plan.   Reason for Continuation of Hospitalization: Depression Medication stabilization  Estimated Length of Stay: 1-7 days  Last 3 [place] Suicide Severity Risk Score: Flowsheet Row Admission (Current) from 09/17/2022 in [place] ED from 09/15/2022 in [facility] Department [facility] from 09/14/2022 in [facility] Emergency Department at [place] No Risk No Risk No Risk       Last PHQ 2/9 Scores:    04/26/2022   11:03 AM 04/26/2022   11:02 AM 01/30/2022   12:58 PM  Depression screen PHQ 2/9  Decreased Interest 3 0 0  Down, Depressed, Hopeless 3 1 0  PHQ - 2 Score 6 1 0  Altered sleeping 3    Tired, decreased energy 3    Change in appetite 3    Feeling bad or failure about yourself  1    Trouble concentrating 2    Moving slowly or fidgety/restless 0    Suicidal thoughts 0    PHQ-9 Score 18    Difficult doing work/chores Somewhat difficult      Scribe for Treatment Team: Kaedynce Tapp A [place], [place] 09/19/2022 2:20 PM

## 2022-09-19 NOTE — Progress Notes (Signed)
Patient denies SI, HI, and AVH. Patient pleasant and very talkative. He says he has a cough that hurts his ribs. He also endorses 9/10 generalized pain from arthritis. PRN medications for cough and pain given. He says he has not been sleeping well. Appetite has been good.   Patient's pulse was 49. When checked manually, it was 50. Patient says he feels fine and denies any physical symptoms. Patient remains safe on the unit at this time.

## 2022-09-20 DIAGNOSIS — F333 Major depressive disorder, recurrent, severe with psychotic symptoms: Secondary | ICD-10-CM | POA: Diagnosis not present

## 2022-09-20 LAB — GLUCOSE, CAPILLARY
Glucose-Capillary: 72 mg/dL (ref 70–99)
Glucose-Capillary: 138 mg/dL — ABNORMAL HIGH (ref 70–99)
Glucose-Capillary: 101 mg/dL — ABNORMAL HIGH (ref 70–99)
Glucose-Capillary: 101 mg/dL — ABNORMAL HIGH (ref 70–99)

## 2022-09-20 NOTE — Progress Notes (Signed)
   09/20/22 1700  Psych Admission Type (Psych Patients Only)  Admission Status Voluntary  Psychosocial Assessment  Patient Complaints None  Eye Contact Fair  Facial Expression Blank  Affect Appropriate to circumstance  Speech Logical/coherent  Interaction Assertive  Motor Activity Slow  Appearance/Hygiene Bizarre;Disheveled  Thought Process  Coherency WDL  Content WDL  Delusions None reported or observed  Perception WDL  Hallucination None reported or observed  Judgment WDL  Confusion None  Danger to Self  Current suicidal ideation? Denies  Danger to Others  Danger to Others None reported or observed

## 2022-09-20 NOTE — Progress Notes (Signed)
Patient is alert and oriented times 4. Mood and affect appropriate. Patient denies pain. He denies SI, HI, and AVH. Also denies feelings of anxiety and depression at this time. States he slept good last night. Morning meds given whole by mouth W/O difficulty. Ate breakfast in day room- appetite good. Patient remains on unit with Q15 minute checks in place.

## 2022-09-20 NOTE — Group Note (Signed)
[facility] LCSW Group Therapy Note   Group Date: 09/20/2022 Start Time: 4158 End Time: 1345   Type of Therapy/Topic:  Group Therapy:  Balance in Life  Participation Level:  Did Not Attend   Description of Group:    This group will address the concept of balance and how it feels and looks when one is unbalanced. Patients will be encouraged to process areas in their lives that are out of balance, and identify reasons for remaining unbalanced. Facilitators will guide patients utilizing problem- solving interventions to address and correct the stressor making their life unbalanced. Understanding and applying boundaries will be explored and addressed for obtaining  and maintaining a balanced life. Patients will be encouraged to explore ways to assertively make their unbalanced needs known to significant others in their lives, using other group members and facilitator for support and feedback.  Therapeutic Goals: Patient will identify two or more emotions or situations they have that consume much of in their lives. Patient will identify signs/triggers that life has become out of balance:  Patient will identify two ways to set boundaries in order to achieve balance in their lives:  Patient will demonstrate ability to communicate their needs through discussion and/or role plays  Summary of Patient Progress:  X    Therapeutic Modalities:   Cognitive Behavioral Therapy Solution-Focused Therapy Assertiveness Training   Lillian [place], LCSWA

## 2022-09-20 NOTE — [facility] Suicide Risk Assessment (Signed)
[facility] INPATIENT:  Family/Significant Other Suicide Prevention Education  Suicide Prevention Education:  Patient Refusal for Family/Significant Other Suicide Prevention Education: The patient [name] has refused to provide written consent for family/significant other to be provided Family/Significant Other Suicide Prevention Education during admission and/or prior to discharge.  Physician notified.  SPE completed with pt, as pt refused to consent to family contact. SPI pamphlet provided to pt and pt was encouraged to share information with support network, ask questions, and talk about any concerns relating to SPE. Pt denies access to guns/firearms and verbalized understanding of information provided. Mobile Crisis information also provided to pt.   Aadin Gaut A [place] 09/20/2022, 2:49 PM

## 2022-09-20 NOTE — Plan of Care (Signed)
  Problem: Education: Goal: Knowledge of General Education information will improve Description: Including pain rating scale, medication(s)/side effects and non-pharmacologic comfort measures Outcome: Progressing   Problem: Health Behavior/Discharge Planning: Goal: Ability to manage health-related needs will improve Outcome: Progressing   Problem: Clinical Measurements: Goal: Ability to maintain clinical measurements within normal limits will improve Outcome: Progressing Goal: Will remain free from infection Outcome: Progressing Goal: Diagnostic test results will improve Outcome: Progressing Goal: Respiratory complications will improve Outcome: Progressing Goal: Cardiovascular complication will be avoided Outcome: Progressing   Problem: Activity: Goal: Risk for activity intolerance will decrease Outcome: Progressing   Problem: Nutrition: Goal: Adequate nutrition will be maintained Outcome: Progressing   Problem: Coping: Goal: Level of anxiety will decrease Outcome: Progressing   Problem: Nutritional: Goal: Maintenance of adequate nutrition will improve Outcome: Progressing Goal: Progress toward achieving an optimal weight will improve Outcome: Progressing   Problem: Metabolic: Goal: Ability to maintain appropriate glucose levels will improve Outcome: Progressing   Problem: Self-Concept: Goal: Ability to disclose and discuss suicidal ideas will improve Outcome: Progressing Goal: Will verbalize positive feelings about self Outcome: Progressing   Problem: Medication: Goal: Compliance with prescribed medication regimen will improve Outcome: Progressing

## 2022-09-20 NOTE — Progress Notes (Signed)
[facility] MD Progress Note  09/20/2022 10:46 AM [name]  MRN:  [id number] Subjective: [name] was seen on rounds.  He feels like the medications have been helping.  He is afraid he is going to hurt himself if he is discharged too early.  He is able contract for safety in the hospital.  Social work gave him a Building surveyor of shelters and Psychologist, prison and probation services.  Principal Problem: MDD (major depressive disorder) Diagnosis: Principal Problem:   MDD (major depressive disorder)  Total Time spent with patient: 15 minutes  Past Psychiatric History: Not too extensive.  Past Medical History:  Past Medical History:  Diagnosis Date   Asthma    CHF (congestive heart failure) (HCC)    COPD (chronic obstructive pulmonary disease) ([place])    COVID-19 03/2020   diagnosed in August 2021   Diabetes mellitus without complication ([place])    Homelessness    Hypertension    Infestation by bed bug    Migraine    Obesity    Sleep apnea     Past Surgical History:  Procedure Laterality Date   CARDIAC CATHETERIZATION     CHOLECYSTECTOMY     EYE SURGERY     INNER EAR SURGERY     NOSE SURGERY     Family History:  Family History  Problem Relation Age of Onset   Hypertension Mother    Heart disease Mother    Heart failure Maternal Grandmother     Social History:  Social History   Substance and Sexual Activity  Alcohol Use Never     Social History   Substance and Sexual Activity  Drug Use Never    Social History   Socioeconomic History   Marital status: Single    Spouse name: Not on file   Number of children: 2   Years of education: 12   Highest education level: 12th grade  Occupational History   Occupation: retired  Tobacco Use   Smoking status: Former   Smokeless tobacco: Never  Scientific laboratory technician Use: Not on file  Substance and Sexual Activity   Alcohol use: Never   Drug use: Never   Sexual activity: Never    Birth control/protection: None  Other Topics Concern   Not on file   Social History Narrative   Not on file   Social Determinants of Health   Financial Resource Strain: High Risk (04/27/2022)   Overall Financial Resource Strain (CARDIA)    Difficulty of Paying Living Expenses: Very hard  Food Insecurity: No Food Insecurity (09/17/2022)   Hunger Vital Sign    Worried About Running Out of Food in the Last Year: Never true    Ran Out of Food in the Last Year: Never true  Transportation Needs: No Transportation Needs (09/17/2022)   PRAPARE - Hydrologist (Medical): No    Lack of Transportation (Non-Medical): No  Physical Activity: Sufficiently Active (04/16/2018)   Exercise Vital Sign    Days of Exercise per Week: 7 days    Minutes of Exercise per Session: 30 min  Stress: Stress Concern Present (04/16/2018)   [place]    Feeling of Stress : Very much  Social Connections: Moderately Isolated (04/30/2018)   Social Connection and Isolation Panel [NHANES]    Frequency of Communication with Friends and Family: Twice a week    Frequency of Social Gatherings with Friends and Family: Never    Attends Religious Services:  More than 4 times per year    Active Member of Clubs or Organizations: No    Attends Archivist Meetings: Never    Marital Status: Widowed   Additional Social History:                         Sleep: Good  Appetite:  Good  Current Medications: Current Facility-Administered Medications  Medication Dose Route Frequency Provider Last Rate Last Admin   acetaminophen (TYLENOL) tablet 650 mg  650 mg Oral Q6H PRN Bennett, Christal H, NP   650 mg at 09/19/22 2211   albuterol (PROVENTIL) (2.5 MG/3ML) 0.083% nebulizer solution 2.5 mg  2.5 mg Inhalation Q4H PRN Parks Ranger, DO       alum & mag hydroxide-simeth (MAALOX/MYLANTA) 200-200-20 MG/5ML suspension 30 mL  30 mL Oral Q4H PRN Bennett, Christal H, NP       ascorbic acid  (VITAMIN C) tablet 500 mg  500 mg Oral Daily Bennett, Christal H, NP   500 mg at 09/20/22 0954   aspirin EC tablet 81 mg  81 mg Oral Daily Bennett, Christal H, NP   81 mg at 09/20/22 0952   atorvastatin (LIPITOR) tablet 40 mg  40 mg Oral Daily Bennett, Christal H, NP   40 mg at 09/20/22 9470   benzonatate (TESSALON) capsule 200 mg  200 mg Oral Q4H PRN Parks Ranger, DO   200 mg at 09/20/22 0954   citalopram (CELEXA) tablet 20 mg  20 mg Oral Daily Bennett, Christal H, NP   20 mg at 09/20/22 9628   empagliflozin (JARDIANCE) tablet 10 mg  10 mg Oral Daily Parks Ranger, DO   10 mg at 09/20/22 3662   furosemide (LASIX) tablet 20 mg  20 mg Oral Daily Parks Ranger, DO   20 mg at 09/20/22 9476   hydrOXYzine (ATARAX) tablet 10 mg  10 mg Oral TID PRN Richardson Landry, Christal H, NP       insulin aspart (novoLOG) injection 0-6 Units  0-6 Units Subcutaneous TID WC Parks Ranger, DO       lisinopril (ZESTRIL) tablet 40 mg  40 mg Oral Daily Bennett, Christal H, NP   40 mg at 09/20/22 0953   magnesium hydroxide (MILK OF MAGNESIA) suspension 30 mL  30 mL Oral Daily PRN Bennett, Christal H, NP       OLANZapine (ZYPREXA) tablet 10 mg  10 mg Oral Q6H PRN Parks Ranger, DO       pantoprazole (PROTONIX) EC tablet 40 mg  40 mg Oral Daily Parks Ranger, DO   40 mg at 09/20/22 5465   QUEtiapine (SEROQUEL) tablet 50 mg  50 mg Oral QHS Parks Ranger, DO   50 mg at 09/19/22 2113    Lab Results:  Results for orders placed or performed during the hospital encounter of 09/17/22 (from the past 48 hour(s))  Glucose, capillary     Status: Abnormal   Collection Time: 09/18/22  1:27 PM  Result Value Ref Range   Glucose-Capillary 130 (H) 70 - 99 mg/dL    Comment: Glucose reference range applies only to samples taken after fasting for at least 8 hours.  Glucose, capillary     Status: None   Collection Time: 09/18/22  4:08 PM  Result Value Ref Range    Glucose-Capillary 94 70 - 99 mg/dL    Comment: Glucose reference range applies only to samples taken after fasting for at  least 8 hours.   Comment 1 Notify RN   Glucose, capillary     Status: None   Collection Time: 09/18/22  9:24 PM  Result Value Ref Range   Glucose-Capillary 85 70 - 99 mg/dL    Comment: Glucose reference range applies only to samples taken after fasting for at least 8 hours.  Glucose, capillary     Status: Abnormal   Collection Time: 09/19/22  8:07 AM  Result Value Ref Range   Glucose-Capillary 64 (L) 70 - 99 mg/dL    Comment: Glucose reference range applies only to samples taken after fasting for at least 8 hours.  Glucose, capillary     Status: Abnormal   Collection Time: 09/19/22 11:51 AM  Result Value Ref Range   Glucose-Capillary 112 (H) 70 - 99 mg/dL    Comment: Glucose reference range applies only to samples taken after fasting for at least 8 hours.  Glucose, capillary     Status: Abnormal   Collection Time: 09/19/22  4:36 PM  Result Value Ref Range   Glucose-Capillary 103 (H) 70 - 99 mg/dL    Comment: Glucose reference range applies only to samples taken after fasting for at least 8 hours.  Glucose, capillary     Status: None   Collection Time: 09/19/22  7:49 PM  Result Value Ref Range   Glucose-Capillary 82 70 - 99 mg/dL    Comment: Glucose reference range applies only to samples taken after fasting for at least 8 hours.  Glucose, capillary     Status: None   Collection Time: 09/20/22  8:00 AM  Result Value Ref Range   Glucose-Capillary 72 70 - 99 mg/dL    Comment: Glucose reference range applies only to samples taken after fasting for at least 8 hours.    Blood Alcohol level:  Lab Results  Component Value Date   ETH <10 09/15/2022   ETH <10 93/57/0177    Metabolic Disorder Labs: Lab Results  Component Value Date   HGBA1C 4.9 09/15/2022   MPG 93.93 09/15/2022   MPG 108.28 03/26/2022   No results found for: "PROLACTIN" Lab Results   Component Value Date   CHOL 92 04/29/2022   TRIG 51 04/29/2022   HDL 25 (L) 04/29/2022   CHOLHDL 3.7 04/29/2022   VLDL 10 04/29/2022   LDLCALC 57 04/29/2022   LDLCALC 100 (H) 10/04/2021    Physical Findings: AIMS:  , ,  ,  ,    CIWA:    COWS:     Musculoskeletal: Strength & Muscle Tone: within normal limits Gait & Station: normal Patient leans: N/A  Psychiatric Specialty Exam:  Presentation  General Appearance:  Disheveled  Eye Contact: Good  Speech: Clear and Coherent  Speech Volume: Normal  Handedness: Right   Mood and Affect  Mood: Euphoric  Affect: Congruent   Thought Process  Thought Processes: Coherent  Descriptions of Associations:Intact  Orientation:Full (Time, Place and Person)  Thought Content:WDL  History of Schizophrenia/Schizoaffective disorder:No  Duration of Psychotic Symptoms:Less than six months  Hallucinations:No data recorded Ideas of Reference:None  Suicidal Thoughts:No data recorded Homicidal Thoughts:No data recorded  Sensorium  Memory: Immediate Good; Recent Good  Judgment: Fair  Insight: Poor   Executive Functions  Concentration: Good  Attention Span: Good  Recall: Good  Fund of Knowledge: Poor  Language: Fair   Psychomotor Activity  Psychomotor Activity:No data recorded  Assets  Assets: Financial Resources/Insurance; Resilience   Sleep  Sleep:No data recorded   Physical Exam: Physical Exam  Vitals and nursing note reviewed.  Constitutional:      Appearance: Normal appearance. He is normal weight.  Neurological:     General: No focal deficit present.     Mental Status: He is alert and oriented to person, place, and time.  Psychiatric:        Attention and Perception: Attention and perception normal.        Mood and Affect: Mood is depressed. Affect is flat.        Speech: Speech normal.        Behavior: Behavior normal. Behavior is cooperative.        Thought Content:  Thought content includes suicidal ideation.        Cognition and Memory: Cognition and memory normal.        Judgment: Judgment is impulsive and inappropriate.    Review of Systems  Constitutional: Negative.   HENT: Negative.    Eyes: Negative.   Respiratory: Negative.    Cardiovascular: Negative.   Gastrointestinal: Negative.   Genitourinary: Negative.   Musculoskeletal: Negative.   Skin: Negative.   Neurological: Negative.   Endo/Heme/Allergies: Negative.   Psychiatric/Behavioral:  Positive for depression.    Blood pressure 125/62, pulse (!) 51, temperature 98.2 F (36.8 C), temperature source Oral, resp. rate 16, height '5\' 6"'$  (1.676 m), weight 111 kg, SpO2 99 %. Body mass index is 39.5 kg/m.   Treatment Plan Summary: Daily contact with patient to assess and evaluate symptoms and progress in treatment, Medication management, and Plan continue current medications.  Parks Ranger, DO 09/20/2022, 10:46 AM

## 2022-09-20 NOTE — Progress Notes (Signed)
Patient denies SI, HI, and AVH. He is pleasant and engages in conversation with staff. No behavioral issues observed. He endorses chronic pain from arthritis, but says that Tylenol does not help. Patient also says he has had a bad cough but that the cough medicine also does not help. Patient offered PRN medication for pain and cough, but refused. He is compliant with scheduled medications. Support and encouragement provided. Patient remains safe on the unit at this time.

## 2022-09-20 NOTE — [facility] Group Notes (Signed)
[facility] Group Notes:  (Nursing/MHT/Case Management/Adjunct)  Date:  09/20/2022  Time:  6:23 PM  Type of Therapy:   Breathing Techniques  Participation Level:  Did Not Attend  Participation Quality:   Did Not Attend   Affect:  Depressed  Cognitive:  Alert  Insight:  Lacking  Engagement in Group:  None  Modes of Intervention:  Activity, Discussion, and Education  Summary of Progress/Problems:  Nolon Bussing 09/20/2022, 6:23 PM

## 2022-09-21 DIAGNOSIS — F333 Major depressive disorder, recurrent, severe with psychotic symptoms: Secondary | ICD-10-CM | POA: Diagnosis not present

## 2022-09-21 LAB — GLUCOSE, CAPILLARY
Glucose-Capillary: 78 mg/dL (ref 70–99)
Glucose-Capillary: 123 mg/dL — ABNORMAL HIGH (ref 70–99)
Glucose-Capillary: 88 mg/dL (ref 70–99)
Glucose-Capillary: 99 mg/dL (ref 70–99)

## 2022-09-21 NOTE — Progress Notes (Signed)
Patient is alert and oriented times 4. Mood and affect appropriate. Patient denies pain. He denies SI, HI, and AVH. Also denies feelings of anxiety and depression at this time. States he slept good last night. Morning meds given whole by mouth W/O difficulty. Ate breakfast in day room- appetite good. Patient remains on unit with Q15 minute checks in place.

## 2022-09-21 NOTE — Plan of Care (Signed)
  Problem: Education: Goal: Knowledge of General Education information will improve Description: Including pain rating scale, medication(s)/side effects and non-pharmacologic comfort measures Outcome: Progressing   Problem: Health Behavior/Discharge Planning: Goal: Ability to manage health-related needs will improve Outcome: Progressing   Problem: Clinical Measurements: Goal: Ability to maintain clinical measurements within normal limits will improve Outcome: Progressing Goal: Will remain free from infection Outcome: Progressing Goal: Diagnostic test results will improve Outcome: Progressing Goal: Respiratory complications will improve Outcome: Progressing Goal: Cardiovascular complication will be avoided Outcome: Progressing   Problem: Activity: Goal: Risk for activity intolerance will decrease Outcome: Progressing   Problem: Nutrition: Goal: Adequate nutrition will be maintained Outcome: Progressing   Problem: Coping: Goal: Level of anxiety will decrease Outcome: Progressing   Problem: Elimination: Goal: Will not experience complications related to bowel motility Outcome: Progressing Goal: Will not experience complications related to urinary retention Outcome: Progressing   Problem: Pain Managment: Goal: General experience of comfort will improve Outcome: Progressing   Problem: Safety: Goal: Ability to remain free from injury will improve Outcome: Progressing   Problem: Skin Integrity: Goal: Risk for impaired skin integrity will decrease Outcome: Progressing   Problem: Education: Goal: Ability to describe self-care measures that may prevent or decrease complications (Diabetes Survival Skills Education) will improve Outcome: Progressing Goal: Individualized Educational Video(s) Outcome: Progressing   Problem: Coping: Goal: Ability to adjust to condition or change in health will improve Outcome: Progressing   Problem: Fluid Volume: Goal: Ability to  maintain a balanced intake and output will improve Outcome: Progressing   Problem: Health Behavior/Discharge Planning: Goal: Ability to identify and utilize available resources and services will improve Outcome: Progressing Goal: Ability to manage health-related needs will improve Outcome: Progressing   Problem: Self-Concept: Goal: Ability to disclose and discuss suicidal ideas will improve Outcome: Progressing Goal: Will verbalize positive feelings about self Outcome: Progressing   Problem: Medication: Goal: Compliance with prescribed medication regimen will improve Outcome: Progressing   Problem: Health Behavior/Discharge Planning: Goal: Identification of resources available to assist in meeting health care needs will improve Outcome: Progressing   Problem: Coping: Goal: Coping ability will improve Outcome: Progressing

## 2022-09-21 NOTE — Progress Notes (Signed)
   09/21/22 0750  Psych Admission Type (Psych Patients Only)  Admission Status Voluntary  Psychosocial Assessment  Patient Complaints None  Eye Contact Fair  Facial Expression Blank  Affect Appropriate to circumstance  Speech Logical/coherent  Interaction Assertive  Motor Activity Slow  Appearance/Hygiene Bizarre;Disheveled  Thought Process  Coherency WDL  Content WDL  Delusions None reported or observed  Perception WDL  Hallucination None reported or observed  Judgment WDL  Confusion None  Danger to Self  Current suicidal ideation? Denies  Danger to Others  Danger to Others None reported or observed

## 2022-09-21 NOTE — Progress Notes (Signed)
[facility] MD Progress Note  09/21/2022 12:49 PM [name]  MRN:  [id number] Subjective: [name] is seen on rounds.  He feels like the medications have been helpful.  His sleep and appetite have improved.  He is able to contract for safety in the hospital but does not feel like he would be safe for discharge.  He says he might do something stupid like walk out in traffic.  Principal Problem: MDD (major depressive disorder) Diagnosis: Principal Problem:   MDD (major depressive disorder)  Total Time spent with patient: 15 minutes  Past Psychiatric History: Not too extensive.  Past Medical History:  Past Medical History:  Diagnosis Date   Asthma    CHF (congestive heart failure) (HCC)    COPD (chronic obstructive pulmonary disease) ([place])    COVID-19 03/2020   diagnosed in August 2021   Diabetes mellitus without complication ([place])    Homelessness    Hypertension    Infestation by bed bug    Migraine    Obesity    Sleep apnea     Past Surgical History:  Procedure Laterality Date   CARDIAC CATHETERIZATION     CHOLECYSTECTOMY     EYE SURGERY     INNER EAR SURGERY     NOSE SURGERY     Family History:  Family History  Problem Relation Age of Onset   Hypertension Mother    Heart disease Mother    Heart failure Maternal Grandmother     Social History:  Social History   Substance and Sexual Activity  Alcohol Use Never     Social History   Substance and Sexual Activity  Drug Use Never    Social History   Socioeconomic History   Marital status: Single    Spouse name: Not on file   Number of children: 2   Years of education: 12   Highest education level: 12th grade  Occupational History   Occupation: retired  Tobacco Use   Smoking status: Former   Smokeless tobacco: Never  Scientific laboratory technician Use: Not on file  Substance and Sexual Activity   Alcohol use: Never   Drug use: Never   Sexual activity: Never    Birth control/protection: None  Other Topics Concern    Not on file  Social History Narrative   Not on file   Social Determinants of Health   Financial Resource Strain: High Risk (04/27/2022)   Overall Financial Resource Strain (CARDIA)    Difficulty of Paying Living Expenses: Very hard  Food Insecurity: No Food Insecurity (09/17/2022)   Hunger Vital Sign    Worried About Running Out of Food in the Last Year: Never true    Ran Out of Food in the Last Year: Never true  Transportation Needs: No Transportation Needs (09/17/2022)   PRAPARE - Hydrologist (Medical): No    Lack of Transportation (Non-Medical): No  Physical Activity: Sufficiently Active (04/16/2018)   Exercise Vital Sign    Days of Exercise per Week: 7 days    Minutes of Exercise per Session: 30 min  Stress: Stress Concern Present (04/16/2018)   [place]    Feeling of Stress : Very much  Social Connections: Moderately Isolated (04/30/2018)   Social Connection and Isolation Panel [NHANES]    Frequency of Communication with Friends and Family: Twice a week    Frequency of Social Gatherings with Friends and Family: Never  Attends Religious Services: More than 4 times per year    Active Member of Clubs or Organizations: No    Attends Archivist Meetings: Never    Marital Status: Widowed   Additional Social History:                         Sleep: Good  Appetite:  Good  Current Medications: Current Facility-Administered Medications  Medication Dose Route Frequency Provider Last Rate Last Admin   acetaminophen (TYLENOL) tablet 650 mg  650 mg Oral Q6H PRN Bennett, Christal H, NP   650 mg at 09/19/22 2211   albuterol (PROVENTIL) (2.5 MG/3ML) 0.083% nebulizer solution 2.5 mg  2.5 mg Inhalation Q4H PRN Parks Ranger, DO       alum & mag hydroxide-simeth (MAALOX/MYLANTA) 200-200-20 MG/5ML suspension 30 mL  30 mL Oral Q4H PRN Bennett, Christal H, NP        ascorbic acid (VITAMIN C) tablet 500 mg  500 mg Oral Daily Bennett, Christal H, NP   500 mg at 09/21/22 0944   aspirin EC tablet 81 mg  81 mg Oral Daily Bennett, Christal H, NP   81 mg at 09/21/22 0943   atorvastatin (LIPITOR) tablet 40 mg  40 mg Oral Daily Bennett, Christal H, NP   40 mg at 09/21/22 0944   benzonatate (TESSALON) capsule 200 mg  200 mg Oral Q4H PRN Parks Ranger, DO   200 mg at 09/20/22 1656   citalopram (CELEXA) tablet 20 mg  20 mg Oral Daily Bennett, Christal H, NP   20 mg at 09/21/22 0944   empagliflozin (JARDIANCE) tablet 10 mg  10 mg Oral Daily Parks Ranger, DO   10 mg at 09/21/22 6553   furosemide (LASIX) tablet 20 mg  20 mg Oral Daily Parks Ranger, DO   20 mg at 09/21/22 7482   hydrOXYzine (ATARAX) tablet 10 mg  10 mg Oral TID PRN Richardson Landry, Christal H, NP       insulin aspart (novoLOG) injection 0-6 Units  0-6 Units Subcutaneous TID WC Parks Ranger, DO       lisinopril (ZESTRIL) tablet 40 mg  40 mg Oral Daily Bennett, Christal H, NP   40 mg at 09/21/22 0944   magnesium hydroxide (MILK OF MAGNESIA) suspension 30 mL  30 mL Oral Daily PRN Bennett, Christal H, NP       OLANZapine (ZYPREXA) tablet 10 mg  10 mg Oral Q6H PRN Parks Ranger, DO       pantoprazole (PROTONIX) EC tablet 40 mg  40 mg Oral Daily Parks Ranger, DO   40 mg at 09/21/22 0944   QUEtiapine (SEROQUEL) tablet 50 mg  50 mg Oral QHS Parks Ranger, DO   50 mg at 09/20/22 2111    Lab Results:  Results for orders placed or performed during the hospital encounter of 09/17/22 (from the past 48 hour(s))  Glucose, capillary     Status: Abnormal   Collection Time: 09/19/22  4:36 PM  Result Value Ref Range   Glucose-Capillary 103 (H) 70 - 99 mg/dL    Comment: Glucose reference range applies only to samples taken after fasting for at least 8 hours.  Glucose, capillary     Status: None   Collection Time: 09/19/22  7:49 PM  Result Value Ref Range    Glucose-Capillary 82 70 - 99 mg/dL    Comment: Glucose reference range applies only to samples taken after  fasting for at least 8 hours.  Glucose, capillary     Status: None   Collection Time: 09/20/22  8:00 AM  Result Value Ref Range   Glucose-Capillary 72 70 - 99 mg/dL    Comment: Glucose reference range applies only to samples taken after fasting for at least 8 hours.  Glucose, capillary     Status: Abnormal   Collection Time: 09/20/22 11:52 AM  Result Value Ref Range   Glucose-Capillary 138 (H) 70 - 99 mg/dL    Comment: Glucose reference range applies only to samples taken after fasting for at least 8 hours.  Glucose, capillary     Status: Abnormal   Collection Time: 09/20/22  4:29 PM  Result Value Ref Range   Glucose-Capillary 101 (H) 70 - 99 mg/dL    Comment: Glucose reference range applies only to samples taken after fasting for at least 8 hours.  Glucose, capillary     Status: Abnormal   Collection Time: 09/20/22  8:04 PM  Result Value Ref Range   Glucose-Capillary 101 (H) 70 - 99 mg/dL    Comment: Glucose reference range applies only to samples taken after fasting for at least 8 hours.  Glucose, capillary     Status: None   Collection Time: 09/21/22  7:53 AM  Result Value Ref Range   Glucose-Capillary 78 70 - 99 mg/dL    Comment: Glucose reference range applies only to samples taken after fasting for at least 8 hours.  Glucose, capillary     Status: Abnormal   Collection Time: 09/21/22 11:48 AM  Result Value Ref Range   Glucose-Capillary 123 (H) 70 - 99 mg/dL    Comment: Glucose reference range applies only to samples taken after fasting for at least 8 hours.    Blood Alcohol level:  Lab Results  Component Value Date   ETH <10 09/15/2022   ETH <10 60/63/0160    Metabolic Disorder Labs: Lab Results  Component Value Date   HGBA1C 4.9 09/15/2022   MPG 93.93 09/15/2022   MPG 108.28 03/26/2022   No results found for: "PROLACTIN" Lab Results  Component Value Date    CHOL 92 04/29/2022   TRIG 51 04/29/2022   HDL 25 (L) 04/29/2022   CHOLHDL 3.7 04/29/2022   VLDL 10 04/29/2022   LDLCALC 57 04/29/2022   LDLCALC 100 (H) 10/04/2021    Physical Findings: AIMS:  , ,  ,  ,    CIWA:    COWS:     Musculoskeletal: Strength & Muscle Tone: within normal limits Gait & Station: normal Patient leans: N/A  Psychiatric Specialty Exam:  Presentation  General Appearance:  Disheveled  Eye Contact: Good  Speech: Clear and Coherent  Speech Volume: Normal  Handedness: Right   Mood and Affect  Mood: Euphoric  Affect: Congruent   Thought Process  Thought Processes: Coherent  Descriptions of Associations:Intact  Orientation:Full (Time, Place and Person)  Thought Content:WDL  History of Schizophrenia/Schizoaffective disorder:No  Duration of Psychotic Symptoms:Less than six months  Hallucinations:No data recorded Ideas of Reference:None  Suicidal Thoughts:No data recorded Homicidal Thoughts:No data recorded  Sensorium  Memory: Immediate Good; Recent Good  Judgment: Fair  Insight: Poor   Executive Functions  Concentration: Good  Attention Span: Good  Recall: Good  Fund of Knowledge: Poor  Language: Fair   Psychomotor Activity  Psychomotor Activity:No data recorded  Assets  Assets: Financial Resources/Insurance; Resilience   Sleep  Sleep:No data recorded   Physical Exam: Physical Exam Vitals and nursing note  reviewed.  Constitutional:      Appearance: Normal appearance. He is normal weight.  Neurological:     General: No focal deficit present.     Mental Status: He is alert and oriented to person, place, and time.  Psychiatric:        Attention and Perception: Attention and perception normal.        Mood and Affect: Mood is depressed. Affect is flat.        Speech: Speech normal.        Behavior: Behavior normal. Behavior is cooperative.        Thought Content: Thought content is paranoid.         Cognition and Memory: Cognition and memory normal.        Judgment: Judgment is impulsive.    Review of Systems  Constitutional: Negative.   HENT: Negative.    Eyes: Negative.   Respiratory: Negative.    Cardiovascular: Negative.   Gastrointestinal: Negative.   Genitourinary: Negative.   Musculoskeletal: Negative.   Skin: Negative.   Neurological: Negative.   Endo/Heme/Allergies: Negative.   Psychiatric/Behavioral:  Positive for depression and suicidal ideas.    Blood pressure (!) 162/70, pulse (!) 48, temperature 98.5 F (36.9 C), temperature source Oral, resp. rate 20, height '5\' 6"'$  (1.676 m), weight 111 kg, SpO2 100 %. Body mass index is 39.5 kg/m.   Treatment Plan Summary: Daily contact with patient to assess and evaluate symptoms and progress in treatment, Medication management, and Plan continue current medications.  Parks Ranger, DO 09/21/2022, 12:49 PM

## 2022-09-22 ENCOUNTER — Inpatient Hospital Stay: Payer: 59

## 2022-09-22 ENCOUNTER — Inpatient Hospital Stay
Admission: RE | Admit: 2022-09-22 | Discharge: 2022-09-24 | DRG: 442 | Disposition: A | Discharge status: Other Acute Inpatient Hospital | Payer: 59 | Source: Intra-hospital | Attending: Internal Medicine | Admitting: Internal Medicine

## 2022-09-22 ENCOUNTER — Encounter (HOSPITAL_COMMUNITY): Payer: Self-pay

## 2022-09-22 DIAGNOSIS — G43909 Migraine, unspecified, not intractable, without status migrainosus: Secondary | ICD-10-CM | POA: Diagnosis present

## 2022-09-22 DIAGNOSIS — Z885 Allergy status to narcotic agent status: Secondary | ICD-10-CM

## 2022-09-22 DIAGNOSIS — Z6839 Body mass index (BMI) 39.0-39.9, adult: Secondary | ICD-10-CM | POA: Diagnosis not present

## 2022-09-22 DIAGNOSIS — E860 Dehydration: Secondary | ICD-10-CM | POA: Diagnosis present

## 2022-09-22 DIAGNOSIS — C220 Liver cell carcinoma: Secondary | ICD-10-CM | POA: Diagnosis present

## 2022-09-22 DIAGNOSIS — E669 Obesity, unspecified: Secondary | ICD-10-CM | POA: Diagnosis present

## 2022-09-22 DIAGNOSIS — R45851 Suicidal ideations: Secondary | ICD-10-CM | POA: Diagnosis present

## 2022-09-22 DIAGNOSIS — K7460 Unspecified cirrhosis of liver: Secondary | ICD-10-CM | POA: Diagnosis present

## 2022-09-22 DIAGNOSIS — F419 Anxiety disorder, unspecified: Secondary | ICD-10-CM | POA: Diagnosis present

## 2022-09-22 DIAGNOSIS — K7682 Hepatic encephalopathy: Secondary | ICD-10-CM | POA: Diagnosis not present

## 2022-09-22 DIAGNOSIS — Z8249 Family history of ischemic heart disease and other diseases of the circulatory system: Secondary | ICD-10-CM

## 2022-09-22 DIAGNOSIS — G9341 Metabolic encephalopathy: Secondary | ICD-10-CM | POA: Diagnosis not present

## 2022-09-22 DIAGNOSIS — D696 Thrombocytopenia, unspecified: Secondary | ICD-10-CM | POA: Diagnosis present

## 2022-09-22 DIAGNOSIS — Z8673 Personal history of transient ischemic attack (TIA), and cerebral infarction without residual deficits: Secondary | ICD-10-CM

## 2022-09-22 DIAGNOSIS — J4489 Other specified chronic obstructive pulmonary disease: Secondary | ICD-10-CM | POA: Diagnosis present

## 2022-09-22 DIAGNOSIS — F329 Major depressive disorder, single episode, unspecified: Secondary | ICD-10-CM | POA: Diagnosis present

## 2022-09-22 DIAGNOSIS — K766 Portal hypertension: Secondary | ICD-10-CM | POA: Diagnosis present

## 2022-09-22 DIAGNOSIS — Z9049 Acquired absence of other specified parts of digestive tract: Secondary | ICD-10-CM

## 2022-09-22 DIAGNOSIS — E119 Type 2 diabetes mellitus without complications: Secondary | ICD-10-CM | POA: Diagnosis present

## 2022-09-22 DIAGNOSIS — Z8616 Personal history of COVID-19: Secondary | ICD-10-CM | POA: Diagnosis not present

## 2022-09-22 DIAGNOSIS — K769 Liver disease, unspecified: Secondary | ICD-10-CM | POA: Diagnosis present

## 2022-09-22 DIAGNOSIS — I5032 Chronic diastolic (congestive) heart failure: Secondary | ICD-10-CM | POA: Diagnosis not present

## 2022-09-22 DIAGNOSIS — Z87891 Personal history of nicotine dependence: Secondary | ICD-10-CM | POA: Diagnosis not present

## 2022-09-22 DIAGNOSIS — Z5902 Unsheltered homelessness: Secondary | ICD-10-CM | POA: Diagnosis not present

## 2022-09-22 DIAGNOSIS — J439 Emphysema, unspecified: Secondary | ICD-10-CM | POA: Diagnosis not present

## 2022-09-22 DIAGNOSIS — G4730 Sleep apnea, unspecified: Secondary | ICD-10-CM | POA: Diagnosis present

## 2022-09-22 DIAGNOSIS — Z91030 Bee allergy status: Secondary | ICD-10-CM

## 2022-09-22 DIAGNOSIS — Z79899 Other long term (current) drug therapy: Secondary | ICD-10-CM

## 2022-09-22 DIAGNOSIS — J449 Chronic obstructive pulmonary disease, unspecified: Secondary | ICD-10-CM | POA: Diagnosis present

## 2022-09-22 DIAGNOSIS — Z8619 Personal history of other infectious and parasitic diseases: Secondary | ICD-10-CM

## 2022-09-22 DIAGNOSIS — Z884 Allergy status to anesthetic agent status: Secondary | ICD-10-CM

## 2022-09-22 DIAGNOSIS — F332 Major depressive disorder, 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 psychotic features: Secondary | ICD-10-CM | POA: Diagnosis present

## 2022-09-22 DIAGNOSIS — E1169 Type 2 diabetes mellitus with other specified complication: Secondary | ICD-10-CM

## 2022-09-22 DIAGNOSIS — I110 Hypertensive heart disease with heart failure: Secondary | ICD-10-CM | POA: Diagnosis present

## 2022-09-22 DIAGNOSIS — Z882 Allergy status to sulfonamides status: Secondary | ICD-10-CM

## 2022-09-22 DIAGNOSIS — R4182 Altered mental status, unspecified: Secondary | ICD-10-CM

## 2022-09-22 DIAGNOSIS — E785 Hyperlipidemia, unspecified: Secondary | ICD-10-CM | POA: Diagnosis present

## 2022-09-22 DIAGNOSIS — F333 Major depressive disorder, recurrent, severe with psychotic symptoms: Secondary | ICD-10-CM | POA: Diagnosis not present

## 2022-09-22 DIAGNOSIS — I10 Essential (primary) hypertension: Secondary | ICD-10-CM | POA: Diagnosis present

## 2022-09-22 DIAGNOSIS — Z7982 Long term (current) use of aspirin: Secondary | ICD-10-CM

## 2022-09-22 DIAGNOSIS — Z7984 Long term (current) use of oral hypoglycemic drugs: Secondary | ICD-10-CM

## 2022-09-22 LAB — CBC WITH DIFFERENTIAL/PLATELET
Abs Immature Granulocytes: 0 10*3/uL (ref 0.00–0.07)
Basophils Absolute: 0 10*3/uL (ref 0.0–0.1)
Basophils Relative: 1 %
Eosinophils Absolute: 0 10*3/uL (ref 0.0–0.5)
Eosinophils Relative: 1 %
HCT: 33.6 % — ABNORMAL LOW (ref 39.0–52.0)
Hemoglobin: 11.4 g/dL — ABNORMAL LOW (ref 13.0–17.0)
Immature Granulocytes: 0 %
Lymphocytes Relative: 34 %
Lymphs Abs: 0.8 10*3/uL (ref 0.7–4.0)
MCH: 31.8 pg (ref 26.0–34.0)
MCHC: 33.9 g/dL (ref 30.0–36.0)
MCV: 93.6 fL (ref 80.0–100.0)
Monocytes Absolute: 0.4 10*3/uL (ref 0.1–1.0)
Monocytes Relative: 16 %
Neutro Abs: 1.2 10*3/uL — ABNORMAL LOW (ref 1.7–7.7)
Neutrophils Relative %: 48 %
Platelets: 80 10*3/uL — ABNORMAL LOW (ref 150–400)
RBC: 3.59 MIL/uL — ABNORMAL LOW (ref 4.22–5.81)
RDW: 18.3 % — ABNORMAL HIGH (ref 11.5–15.5)
WBC: 2.5 10*3/uL — ABNORMAL LOW (ref 4.0–10.5)
nRBC: 0 % (ref 0.0–0.2)

## 2022-09-22 LAB — COMPREHENSIVE METABOLIC PANEL
ALT: 25 U/L (ref 0–44)
AST: 41 U/L (ref 15–41)
Albumin: 2.5 g/dL — ABNORMAL LOW (ref 3.5–5.0)
Alkaline Phosphatase: 142 U/L — ABNORMAL HIGH (ref 38–126)
Anion gap: 9 (ref 5–15)
BUN: 19 mg/dL (ref 8–23)
CO2: 21 mmol/L — ABNORMAL LOW (ref 22–32)
Calcium: 9.2 mg/dL (ref 8.9–10.3)
Chloride: 110 mmol/L (ref 98–111)
Creatinine, Ser: 0.87 mg/dL (ref 0.61–1.24)
GFR, Estimated: >60 mL/min (ref >60)
Glucose, Bld: 83 mg/dL (ref 70–99)
Potassium: 3.6 mmol/L (ref 3.5–5.1)
Sodium: 140 mmol/L (ref 135–145)
Total Bilirubin: 1.7 mg/dL — ABNORMAL HIGH (ref 0.3–1.2)
Total Protein: 6.5 g/dL (ref 6.5–8.1)

## 2022-09-22 LAB — GLUCOSE, CAPILLARY
Glucose-Capillary: 98 mg/dL (ref 70–99)
Glucose-Capillary: 90 mg/dL (ref 70–99)
Glucose-Capillary: 89 mg/dL (ref 70–99)
Glucose-Capillary: 116 mg/dL — ABNORMAL HIGH (ref 70–99)

## 2022-09-22 LAB — CK: Total CK: 40 U/L — ABNORMAL LOW (ref 49–397)

## 2022-09-22 LAB — BRAIN NATRIURETIC PEPTIDE: B Natriuretic Peptide: 473.7 pg/mL — ABNORMAL HIGH (ref 0.0–100.0)

## 2022-09-22 LAB — AMMONIA: Ammonia: 67 umol/L — ABNORMAL HIGH (ref 9–35)

## 2022-09-22 LAB — APTT: aPTT: 33 seconds (ref 24–36)

## 2022-09-22 LAB — HIV ANTIBODY (ROUTINE TESTING W REFLEX): HIV Screen 4th Generation wRfx: NONREACTIVE

## 2022-09-22 LAB — TSH: TSH: 0.111 u[IU]/mL — ABNORMAL LOW (ref 0.350–4.500)

## 2022-09-22 LAB — PROTIME-INR
INR: 1.3 — ABNORMAL HIGH (ref 0.8–1.2)
Prothrombin Time: 16.4 seconds — ABNORMAL HIGH (ref 11.4–15.2)

## 2022-09-22 MED ORDER — ACETAMINOPHEN 325 MG PO TABS
650.0000 mg | ORAL_TABLET | ORAL | Status: DC | PRN
  Administered 2022-09-23: 650 mg via ORAL
  Filled 2022-09-22: qty 2

## 2022-09-22 MED ORDER — ENOXAPARIN SODIUM 60 MG/0.6ML IJ SOSY
0.5000 mg/kg | PREFILLED_SYRINGE | INTRAMUSCULAR | Status: DC
  Administered 2022-09-22 – 2022-09-23 (×2): 55 mg via SUBCUTANEOUS
  Filled 2022-09-22 (×2): qty 0.6

## 2022-09-22 MED ORDER — HYDRALAZINE HCL 20 MG/ML IJ SOLN: 5.0000 mg | INTRAMUSCULAR | Status: DC | PRN

## 2022-09-22 MED ORDER — LACTULOSE 10 GM/15ML PO SOLN: 20.0000 g | Freq: Two times a day (BID) | ORAL | Status: DC | PRN

## 2022-09-22 MED ORDER — NITROGLYCERIN 0.4 MG SL SUBL: 0.4000 mg | SUBLINGUAL_TABLET | SUBLINGUAL | Status: DC | PRN

## 2022-09-22 MED ORDER — ASPIRIN 325 MG PO TABS
325.0000 mg | ORAL_TABLET | Freq: Every day | ORAL | Status: DC
  Administered 2022-09-22 – 2022-09-24 (×3): 325 mg via ORAL
  Filled 2022-09-22 (×3): qty 1

## 2022-09-22 MED ORDER — QUETIAPINE FUMARATE 50 MG PO TABS: 50.0000 mg | ORAL_TABLET | Freq: Every day | ORAL | 3 refills | Status: DC

## 2022-09-22 MED ORDER — ACETAMINOPHEN 650 MG RE SUPP: 650.0000 mg | RECTAL | Status: DC | PRN

## 2022-09-22 MED ORDER — CITALOPRAM HYDROBROMIDE 20 MG PO TABS: 20.0000 mg | ORAL_TABLET | Freq: Every day | ORAL | 3 refills | Status: DC

## 2022-09-22 MED ORDER — ALBUTEROL SULFATE (2.5 MG/3ML) 0.083% IN NEBU: 2.5000 mg | INHALATION_SOLUTION | RESPIRATORY_TRACT | Status: DC | PRN

## 2022-09-22 MED ORDER — HYDROXYZINE HCL 10 MG PO TABS: 10.0000 mg | ORAL_TABLET | Freq: Three times a day (TID) | ORAL | Status: DC | PRN

## 2022-09-22 MED ORDER — PANTOPRAZOLE SODIUM 20 MG PO TBEC
20.0000 mg | DELAYED_RELEASE_TABLET | Freq: Every day | ORAL | Status: DC
  Administered 2022-09-22 – 2022-09-24 (×3): 20 mg via ORAL
  Filled 2022-09-22 (×3): qty 1

## 2022-09-22 MED ORDER — DM-GUAIFENESIN ER 30-600 MG PO TB12: 1.0000 | ORAL_TABLET | Freq: Two times a day (BID) | ORAL | Status: DC | PRN

## 2022-09-22 MED ORDER — ONDANSETRON HCL 4 MG/2ML IJ SOLN
4.0000 mg | Freq: Four times a day (QID) | INTRAMUSCULAR | Status: DC | PRN
  Administered 2022-09-22: 4 mg via INTRAVENOUS
  Filled 2022-09-22: qty 2

## 2022-09-22 MED ORDER — ATORVASTATIN CALCIUM 20 MG PO TABS
40.0000 mg | ORAL_TABLET | Freq: Every day | ORAL | Status: DC
  Administered 2022-09-22 – 2022-09-24 (×3): 40 mg via ORAL
  Filled 2022-09-22 (×3): qty 2

## 2022-09-22 MED ORDER — IOHEXOL 350 MG/ML SOLN
75.0000 mL | Freq: Once | INTRAVENOUS | Status: AC | PRN
  Administered 2022-09-22: 100 mL via INTRAVENOUS

## 2022-09-22 MED ORDER — ASPIRIN 300 MG RE SUPP
300.0000 mg | Freq: Every day | RECTAL | Status: DC
  Filled 2022-09-22 (×3): qty 1

## 2022-09-22 MED ORDER — SENNOSIDES-DOCUSATE SODIUM 8.6-50 MG PO TABS: 1.0000 | ORAL_TABLET | Freq: Every evening | ORAL | Status: DC | PRN

## 2022-09-22 MED ORDER — ENOXAPARIN SODIUM 40 MG/0.4ML IJ SOSY: 40.0000 mg | PREFILLED_SYRINGE | INTRAMUSCULAR | Status: DC

## 2022-09-22 MED ORDER — ACETAMINOPHEN 160 MG/5ML PO SOLN: 650.0000 mg | ORAL | Status: DC | PRN

## 2022-09-22 MED ORDER — ONDANSETRON HCL 4 MG/2ML IJ SOLN: 4.0000 mg | Freq: Three times a day (TID) | INTRAMUSCULAR | Status: DC | PRN

## 2022-09-22 MED ORDER — LACTULOSE 10 GM/15ML PO SOLN
30.0000 g | Freq: Two times a day (BID) | ORAL | Status: DC
  Administered 2022-09-22 – 2022-09-24 (×4): 30 g via ORAL
  Filled 2022-09-22 (×4): qty 60

## 2022-09-22 MED ORDER — QUETIAPINE FUMARATE 25 MG PO TABS: 50.0000 mg | ORAL_TABLET | Freq: Every day | ORAL | Status: DC

## 2022-09-22 MED ORDER — INSULIN ASPART 100 UNIT/ML IJ SOLN: 0.0000 [IU] | Freq: Three times a day (TID) | INTRAMUSCULAR | Status: DC

## 2022-09-22 MED ORDER — SODIUM CHLORIDE 0.9 % IV SOLN: INTRAVENOUS | Status: DC

## 2022-09-22 MED ORDER — INSULIN ASPART 100 UNIT/ML IJ SOLN: 0.0000 [IU] | Freq: Every day | INTRAMUSCULAR | Status: DC

## 2022-09-22 MED ORDER — CITALOPRAM HYDROBROMIDE 20 MG PO TABS
20.0000 mg | ORAL_TABLET | Freq: Every day | ORAL | Status: DC
  Administered 2022-09-22: 20 mg via ORAL
  Filled 2022-09-22: qty 1

## 2022-09-22 MED ORDER — STROKE: EARLY STAGES OF RECOVERY BOOK
Freq: Once | Status: AC
  Administered 2022-09-23: 1

## 2022-09-22 NOTE — Consult Note (Signed)
CODE STROKE- PHARMACY COMMUNICATION   Time CODE STROKE called/page received:[id number]  Time response to CODE STROKE was made (in person or via phone): in person @ 0901 (CT1)  Time Stroke Kit retrieved from [place] (only if needed):n/a  Name of Provider/Nurse contacted:Dr Leonel Ramsay  Past Medical History:  Diagnosis Date   Asthma    CHF (congestive heart failure) ([place])    COPD (chronic obstructive pulmonary disease) ([place])    COVID-19 03/2020   diagnosed in August 2021   Diabetes mellitus without complication ([place])    Homelessness    Hypertension    Infestation by bed bug    Migraine    Obesity    Sleep apnea    Prior to Admission medications   Medication Sig Start Date End Date Taking? Authorizing Provider  albuterol (PROVENTIL) (2.5 MG/3ML) 0.083% nebulizer solution Take 3 mLs by nebulization every 4 (four) hours as needed for wheezing or shortness of breath. 03/10/22   Vanessa [place], MD  albuterol (VENTOLIN HFA) 108 (90 Base) MCG/ACT inhaler Inhale 2 puffs into the lungs every 6 (six) hours as needed for wheezing or shortness of breath. Patient not taking: Reported on 09/17/2022 04/26/22 09/15/22  Alisa Graff, FNP  ascorbic acid (VITAMIN C) 500 MG tablet Take 1 tablet (500 mg total) by mouth daily. 04/20/20   Samuella Cota, MD  aspirin EC 81 MG tablet Take 1 tablet (81 mg total) by mouth daily. Swallow whole. 04/26/22   Alisa Graff, FNP  atorvastatin (LIPITOR) 40 MG tablet Take 1 tablet (40 mg total) by mouth daily. Patient not taking: Reported on 09/15/2022 04/26/22   Darylene Price A, FNP  EPINEPHrine 0.3 mg/0.3 mL IJ SOAJ injection Inject 0.3 mg into the muscle as needed. 07/01/22   [provider]  furosemide (LASIX) 20 MG tablet Take 20 mg by mouth every morning. 07/27/22   [provider]  glucosamine-chondroitin 500-400 MG tablet Take 1 tablet by mouth 3 (three) times daily.    [provider]  hydrOXYzine (ATARAX) 10 MG tablet Take 10 mg by mouth  3 (three) times daily as needed. 07/27/22   [provider]  JARDIANCE 10 MG TABS tablet Take 1 tablet (10 mg total) by mouth daily. 04/26/22   Alisa Graff, FNP  lansoprazole (PREVACID) 30 MG capsule Take 30 mg by mouth daily. 05/22/22   [provider]  lisinopril (ZESTRIL) 40 MG tablet Take 1 tablet (40 mg total) by mouth daily. 04/26/22   Alisa Graff, FNP  metFORMIN (GLUCOPHAGE-XR) 500 MG 24 hr tablet Take 1 tablet (500 mg total) by mouth daily. 04/26/22   Alisa Graff, FNP  Multiple Vitamin (MULTIVITAMIN WITH MINERALS) TABS tablet Take 1 tablet by mouth daily.    [provider]  nitroGLYCERIN (NITROSTAT) 0.4 MG SL tablet Place 1 tablet (0.4 mg total) under the tongue every 5 (five) minutes x 3 doses as needed for chest pain. 04/26/22   Alisa Graff, FNP    Kalen Ratajczak Rodriguez-Guzman PharmD, BCPS 09/22/2022 9:13 AM

## 2022-09-22 NOTE — Significant Event (Signed)
Rapid Response Event Note   Reason for Call : called RR team for new AMS, left side weakness, aphasia.   Initial Focused Assessment: laying in bed, able to follow some directions, left hand obviously weaker than right, can move all other extremities. Mildly aphasic, last know well, by night shift.      Interventions: Code Stroke activated, accompanied patient and bedside RN to CT along with AC. Hospitalist, Dr Blaine Hamper over to CT to assess patient. Dr Leonel Ramsay from Neuro also to CT to assess patient.    Plan of Care: does not meet TNK criteria, Hospitalist to admit for AMS workup per Neurology. Plan for patient to go to 1C when bed available.    Event Summary: as above  MD Notified: Niu/Kirkpatrick 0820 Call Time:0813 Arrival [id number] End CQFJ:0122  [name] A, RN

## 2022-09-22 NOTE — Plan of Care (Signed)
Pt is found A/O to self this morning. Pt is experiencing weakness in extremities and incontinence. Pt was independent, continent, walking and talking as of yesterday according to staff that was present with patient on day shift. Confuse began last night according to night shift. MD notified, rapid called, orders placed and executed. Pt is to be discharged to a medical floor.   Problem: Education: Goal: Knowledge of General Education information will improve Description: Including pain rating scale, medication(s)/side effects and non-pharmacologic comfort measures Outcome: Not Progressing   Problem: Education: Goal: Ability to make informed decisions regarding treatment will improve Outcome: Not Progressing

## 2022-09-22 NOTE — Progress Notes (Signed)
Rapid Response called for altered mental status.  Patient was unable to sit unassisted and not answering questions from nursing staff.  Patient observed not to be at baseline. Dr. Louis Meckel made aware.  Verbal orders given for Stat head CT.

## 2022-09-22 NOTE — Progress Notes (Signed)
Patient is alert and oriented times 4. Mood and affect appropriate. Patient rates pain as 2/10 and states it is a chronic back pain "that is always there." He denies SI, HI, and AVH. Patient also denies any feelings of anxiety and depression at this time. Patient states he slept well last night. Evening medicines administered whole by mouth without difficulty. Patient ate snack in day room; appetite was fair. Patient remains on unit with Q15 minute checks in place.

## 2022-09-22 NOTE — H&P (Signed)
History and Physical    [name] CWC:[id number] DOB: November 13, 1950 DOA: 09/22/2022  Referring MD/NP/PA:   PCP: Denton Lank, MD   Patient coming from:  The patient is coming from [facility] unit        Chief Complaint: AMS  HPI: [name] is a 72 y.o. male with medical history significant of homeless, depression, anxiety, hypertension, hyperlipidemia, diabetes mellitus, COPD, diastolic CHF, TIA, migraine headache, hepatitis C, liver cirrhosis, liver lesion (suspecting cancer), who presents with altered mental status.  Patient was initially admitted to behavioral health on 1/23 due to depression with suicidal ideations.  Patient has been treated with some improvement per Dr. Carlton Adam progressive note, but pt said "he might do something stupid like walk out in traffic". Per nurse report, patient was oriented x 3 yesterday, but was noted to be confused since last night. This morning at 8:10 he was assessed and noted to be very confused which is different from previous days. When I saw pt in the CT department, he is confused, he knows his own name.  He is not oriented to time and place.  He moves his right arm.  He has weakness in left arm and both legs.  He cannot provide detailed information.  Patient does not have active respiratory distress, cough, shortness breath, nausea, vomiting, diarrhea.  Does not seem to have chest pain or abdominal pain.  Not sure if patient has symptoms of UTI.    Data reviewed independently: pt had negative COVID PCR 09/17/2022.  GFR> 60, AST 41, ALT 25, total bilirubin 1.7, INR 1.3, PTT 33. Pending CBC.Temperature normal, blood pressure 172/73, heart rate 48-59, RR 16, oxygen saturation 98% on room air.  CT of head is negative for acute intra cranial abnormalities.  CTA of head and neck negative for LVO.  Patient is admitted to telemetry bed as inpatient. Code is called.  Dr. Leonel Ramsay of neurology saw patient.   EKG:  Not done in ED, will get one.     Review of  Systems: Could not be reviewed due to altered mental status.  Allergy:  Allergies  Allergen Reactions   Bee Venom Anaphylaxis   Codeine Itching    Only itching. Recently allergy tested at [facility]   Novocain [Procaine]    Sulfa Antibiotics Other (See Comments)    Past Medical History:  Diagnosis Date   Asthma    CHF (congestive heart failure) ([place])    COPD (chronic obstructive pulmonary disease) ([place])    COVID-19 03/2020   diagnosed in August 2021   Diabetes mellitus without complication ([place])    Homelessness    Hypertension    Infestation by bed bug    Migraine    Obesity    Sleep apnea     Past Surgical History:  Procedure Laterality Date   CARDIAC CATHETERIZATION     CHOLECYSTECTOMY     EYE SURGERY     INNER EAR SURGERY     NOSE SURGERY      Social History:  reports that he has quit smoking. He has never used smokeless tobacco. He reports that he does not drink alcohol and does not use drugs.  Family History:  Family History  Problem Relation Age of Onset   Hypertension Mother    Heart disease Mother    Heart failure Maternal Grandmother      Prior to Admission medications   Medication Sig Start Date End Date Taking? Authorizing Provider  albuterol (PROVENTIL) (2.5 MG/3ML) 0.083% nebulizer solution  Take 3 mLs by nebulization every 4 (four) hours as needed for wheezing or shortness of breath. 03/10/22   Vanessa Emery, MD  albuterol (VENTOLIN HFA) 108 (90 Base) MCG/ACT inhaler Inhale 2 puffs into the lungs every 6 (six) hours as needed for wheezing or shortness of breath. Patient not taking: Reported on 09/17/2022 04/26/22 09/15/22  Alisa Graff, FNP  ascorbic acid (VITAMIN C) 500 MG tablet Take 1 tablet (500 mg total) by mouth daily. 04/20/20   Samuella Cota, MD  aspirin EC 81 MG tablet Take 1 tablet (81 mg total) by mouth daily. Swallow whole. 04/26/22   Alisa Graff, FNP  atorvastatin (LIPITOR) 40 MG tablet Take 1 tablet (40 mg total) by mouth daily. Patient  not taking: Reported on 09/15/2022 04/26/22   Alisa Graff, FNP  citalopram (CELEXA) 20 MG tablet Take 1 tablet (20 mg total) by mouth daily. 09/22/22   Parks Ranger, DO  furosemide (LASIX) 20 MG tablet Take 20 mg by mouth every morning. 07/27/22   [provider]  glucosamine-chondroitin 500-400 MG tablet Take 1 tablet by mouth 3 (three) times daily.    [provider]  hydrOXYzine (ATARAX) 10 MG tablet Take 10 mg by mouth 3 (three) times daily as needed. 07/27/22   [provider]  JARDIANCE 10 MG TABS tablet Take 1 tablet (10 mg total) by mouth daily. 04/26/22   Alisa Graff, FNP  lansoprazole (PREVACID) 30 MG capsule Take 30 mg by mouth daily. 05/22/22   [provider]  lisinopril (ZESTRIL) 40 MG tablet Take 1 tablet (40 mg total) by mouth daily. 04/26/22   Alisa Graff, FNP  metFORMIN (GLUCOPHAGE-XR) 500 MG 24 hr tablet Take 1 tablet (500 mg total) by mouth daily. 04/26/22   Alisa Graff, FNP  Multiple Vitamin (MULTIVITAMIN WITH MINERALS) TABS tablet Take 1 tablet by mouth daily.    [provider]  nitroGLYCERIN (NITROSTAT) 0.4 MG SL tablet Place 1 tablet (0.4 mg total) under the tongue every 5 (five) minutes x 3 doses as needed for chest pain. 04/26/22   Alisa Graff, FNP  QUEtiapine (SEROQUEL) 50 MG tablet Take 1 tablet (50 mg total) by mouth at bedtime. 09/22/22   Parks Ranger, DO    Physical Exam: There were no vitals filed for this visit. General: Not in acute distress HEENT:       Eyes: PERRL, EOMI, no scleral icterus.       ENT: No discharge from the ears and nose       Neck: No JVD, no bruit, no mass felt. Heme: No neck lymph node enlargement. Cardiac: S1/S2, RRR, No murmurs, No gallops or rubs. Respiratory: No rales, wheezing, rhonchi or rubs. GI: Soft, nondistended, nontender, no organomegaly, BS present. GU: No hematuria Ext: No pitting leg edema bilaterally. 1+DP/PT pulse bilaterally. Musculoskeletal:  No joint deformities, No joint redness or warmth, no limitation of ROM in spin. Skin: No rashes.  Neuro: has AMS, knows his own name, not oriented to time and place, not following command, cranial nerves II-XII grossly intact, patient moves right arm normally, has weakness in both legs and left arm.  Psych: pt has depression, not sure if pt has suicidal homicidal ideations currently  Labs on Admission: I have personally reviewed following labs and imaging studies  CBC: No results for input(s): "WBC", "NEUTROABS", "HGB", "HCT", "MCV", "PLT" in the last 168 hours. Basic Metabolic Panel: Recent Labs  Lab 09/22/22 1420  NA 140  K 3.6  CL 110  CO2 21*  GLUCOSE 83  BUN 19  CREATININE 0.87  CALCIUM 9.2   GFR: Estimated Creatinine Clearance: 88.5 mL/min (by C-G formula based on SCr of 0.87 mg/dL). Liver Function Tests: Recent Labs  Lab 09/22/22 1420  AST 41  ALT 25  ALKPHOS 142*  BILITOT 1.7*  PROT 6.5  ALBUMIN 2.5*   No results for input(s): "LIPASE", "AMYLASE" in the last 168 hours. Recent Labs  Lab 09/22/22 1420  AMMONIA 67*   Coagulation Profile: Recent Labs  Lab 09/22/22 1420  INR 1.3*   Cardiac Enzymes: Recent Labs  Lab 09/22/22 1420  CKTOTAL 40*   BNP (last 3 results) No results for input(s): "PROBNP" in the last 8760 hours. HbA1C: No results for input(s): "HGBA1C" in the last 72 hours. CBG: Recent Labs  Lab 09/21/22 1148 09/21/22 1637 09/21/22 1959 09/22/22 0805 09/22/22 1207  GLUCAP 123* 88 99 98 90   Lipid Profile: No results for input(s): "CHOL", "HDL", "LDLCALC", "TRIG", "CHOLHDL", "LDLDIRECT" in the last 72 hours. Thyroid Function Tests: No results for input(s): "TSH", "T4TOTAL", "FREET4", "T3FREE", "THYROIDAB" in the last 72 hours. Anemia Panel: No results for input(s): "VITAMINB12", "FOLATE", "FERRITIN", "TIBC", "IRON", "RETICCTPCT" in the last 72 hours. Urine analysis:    Component Value Date/Time   COLORURINE AMBER (A) 08/30/2022  2330   APPEARANCEUR HAZY (A) 08/30/2022 2330   LABSPEC 1.029 08/30/2022 2330   PHURINE 5.0 08/30/2022 2330   GLUCOSEU >=500 (A) 08/30/2022 2330   HGBUR MODERATE (A) 08/30/2022 2330   BILIRUBINUR NEGATIVE 08/30/2022 2330   KETONESUR NEGATIVE 08/30/2022 2330   PROTEINUR 30 (A) 08/30/2022 2330   NITRITE NEGATIVE 08/30/2022 2330   LEUKOCYTESUR NEGATIVE 08/30/2022 2330   Sepsis Labs: '@LABRCNTIP'$ (procalcitonin:4,lacticidven:4) ) Recent Results (from the past 240 hour(s))  Resp panel by RT-PCR (RSV, Flu A&B, Covid) Anterior Nasal Swab     Status: None   Collection Time: 09/13/22 11:06 PM   Specimen: Anterior Nasal Swab  Result Value Ref Range Status   SARS Coronavirus 2 by RT PCR NEGATIVE NEGATIVE Final    Comment: (NOTE) SARS-CoV-2 target nucleic acids are NOT DETECTED.  The SARS-CoV-2 RNA is generally detectable in upper respiratory specimens during the acute phase of infection. The lowest concentration of SARS-CoV-2 viral copies this assay can detect is 138 copies/mL. A negative result does not preclude SARS-Cov-2 infection and should not be used as the sole basis for treatment or other patient management decisions. A negative result may occur with  improper specimen collection/handling, submission of specimen other than nasopharyngeal swab, presence of viral mutation(s) within the areas targeted by this assay, and inadequate number of viral copies(<138 copies/mL). A negative result must be combined with clinical observations, patient history, and epidemiological information. The expected result is Negative.  Fact Sheet for Patients:  EntrepreneurPulse.com.au  Fact Sheet for Healthcare Providers:  IncredibleEmployment.be  This test is no t yet approved or cleared by the [place] FDA and  has been authorized for detection and/or diagnosis of SARS-CoV-2 by FDA under an Emergency Use Authorization (EUA). This EUA will remain  in effect  (meaning this test can be used) for the duration of the COVID-19 declaration under Section 564(b)(1) of the Act, 21 U.S.C.section 360bbb-3(b)(1), unless the authorization is terminated  or revoked sooner.       Influenza A by PCR NEGATIVE NEGATIVE Final   Influenza B by PCR NEGATIVE NEGATIVE Final    Comment: (NOTE) The Xpert Xpress SARS-CoV-2/FLU/RSV plus assay is intended as  an aid in the diagnosis of influenza from Nasopharyngeal swab specimens and should not be used as a sole basis for treatment. Nasal washings and aspirates are unacceptable for Xpert Xpress SARS-CoV-2/FLU/RSV testing.  Fact Sheet for Patients: EntrepreneurPulse.com.au  Fact Sheet for Healthcare Providers: IncredibleEmployment.be  This test is not yet approved or cleared by the [place] FDA and has been authorized for detection and/or diagnosis of SARS-CoV-2 by FDA under an Emergency Use Authorization (EUA). This EUA will remain in effect (meaning this test can be used) for the duration of the COVID-19 declaration under Section 564(b)(1) of the Act, 21 U.S.C. section 360bbb-3(b)(1), unless the authorization is terminated or revoked.     Resp Syncytial Virus by PCR NEGATIVE NEGATIVE Final    Comment: (NOTE) Fact Sheet for Patients: EntrepreneurPulse.com.au  Fact Sheet for Healthcare Providers: IncredibleEmployment.be  This test is not yet approved or cleared by the [place] FDA and has been authorized for detection and/or diagnosis of SARS-CoV-2 by FDA under an Emergency Use Authorization (EUA). This EUA will remain in effect (meaning this test can be used) for the duration of the COVID-19 declaration under Section 564(b)(1) of the Act, 21 U.S.C. section 360bbb-3(b)(1), unless the authorization is terminated or revoked.  Performed at [facility], [address] [place], [place] [address]   Resp panel by  RT-PCR (RSV, Flu A&B, Covid) Anterior Nasal Swab     Status: None   Collection Time: 09/17/22  5:25 PM   Specimen: Anterior Nasal Swab  Result Value Ref Range Status   SARS Coronavirus 2 by RT PCR NEGATIVE NEGATIVE Final    Comment: (NOTE) SARS-CoV-2 target nucleic acids are NOT DETECTED.  The SARS-CoV-2 RNA is generally detectable in upper respiratory specimens during the acute phase of infection. The lowest concentration of SARS-CoV-2 viral copies this assay can detect is 138 copies/mL. A negative result does not preclude SARS-Cov-2 infection and should not be used as the sole basis for treatment or other patient management decisions. A negative result may occur with  improper specimen collection/handling, submission of specimen other than nasopharyngeal swab, presence of viral mutation(s) within the areas targeted by this assay, and inadequate number of viral copies(<138 copies/mL). A negative result must be combined with clinical observations, patient history, and epidemiological information. The expected result is Negative.  Fact Sheet for Patients:  EntrepreneurPulse.com.au  Fact Sheet for Healthcare Providers:  IncredibleEmployment.be  This test is no t yet approved or cleared by the [place] FDA and  has been authorized for detection and/or diagnosis of SARS-CoV-2 by FDA under an Emergency Use Authorization (EUA). This EUA will remain  in effect (meaning this test can be used) for the duration of the COVID-19 declaration under Section 564(b)(1) of the Act, 21 U.S.C.section 360bbb-3(b)(1), unless the authorization is terminated  or revoked sooner.       Influenza A by PCR NEGATIVE NEGATIVE Final   Influenza B by PCR NEGATIVE NEGATIVE Final    Comment: (NOTE) The Xpert Xpress SARS-CoV-2/FLU/RSV plus assay is intended as an aid in the diagnosis of influenza from Nasopharyngeal swab specimens and should not be used as a sole basis  for treatment. Nasal washings and aspirates are unacceptable for Xpert Xpress SARS-CoV-2/FLU/RSV testing.  Fact Sheet for Patients: EntrepreneurPulse.com.au  Fact Sheet for Healthcare Providers: IncredibleEmployment.be  This test is not yet approved or cleared by the [place] FDA and has been authorized for detection and/or diagnosis of SARS-CoV-2 by FDA under an Emergency Use Authorization (EUA). This EUA  will remain in effect (meaning this test can be used) for the duration of the COVID-19 declaration under Section 564(b)(1) of the Act, 21 U.S.C. section 360bbb-3(b)(1), unless the authorization is terminated or revoked.     Resp Syncytial Virus by PCR NEGATIVE NEGATIVE Final    Comment: (NOTE) Fact Sheet for Patients: EntrepreneurPulse.com.au  Fact Sheet for Healthcare Providers: IncredibleEmployment.be  This test is not yet approved or cleared by the [place] FDA and has been authorized for detection and/or diagnosis of SARS-CoV-2 by FDA under an Emergency Use Authorization (EUA). This EUA will remain in effect (meaning this test can be used) for the duration of the COVID-19 declaration under Section 564(b)(1) of the Act, 21 U.S.C. section 360bbb-3(b)(1), unless the authorization is terminated or revoked.  Performed at [facility], [address] [place], [place] [address]      Radiological Exams on Admission: CT ANGIO HEAD NECK W WO CM W PERF (CODE STROKE)  Result Date: 09/22/2022 CLINICAL DATA:  72 year old male code stroke presentation this morning. EXAM: CT ANGIOGRAPHY HEAD AND NECK CT PERFUSION BRAIN TECHNIQUE: Multidetector CT imaging of the head and neck was performed using the standard protocol during bolus administration of intravenous contrast. Multiplanar CT image reconstructions and MIPs were obtained to evaluate the vascular anatomy. Carotid stenosis measurements  (when applicable) are obtained utilizing NASCET criteria, using the distal internal carotid diameter as the denominator. Multiphase CT imaging of the brain was performed following IV bolus contrast injection. Subsequent parametric perfusion maps were calculated using RAPID software. RADIATION DOSE REDUCTION: This exam was performed according to the departmental dose-optimization program which includes automated exposure control, adjustment of the mA and/or kV according to patient size and/or use of iterative reconstruction technique. CONTRAST:  174m OMNIPAQUE IOHEXOL 350 MG/ML SOLN COMPARISON:  Plain head CT 0845 hours today. Brain MRI 04/28/2022. FINDINGS: CT Brain Perfusion Findings: ASPECTS: 10 CBF (<30%) Volume: 0 mL Perfusion (Tmax>6.0s) volume: 149m although mostly in a quasi- symmetric pattern of the inferior frontal gyri which is suspicious for artifact. Mismatch Volume: Likely zero Infarction Location:No infarct core detected. CTA NECK Skeleton: Absent dentition. Cervical spine disc and endplate degeneration. Some levels of acquired degenerative interbody ankylosis including in the upper thoracic spine. No acute osseous abnormality identified. Upper chest: Central pulmonary artery enlargement on series 4, image 170, visible pulmonary arteries appear to be normally enhancing. Negative upper lungs. No superior mediastinal lymphadenopathy. Other neck: Negative. Aortic arch: Mild Calcified aortic atherosclerosis. 3 vessel arch configuration. Right carotid system: Mildly tortuous brachiocephalic artery and tortuous proximal right CCA with no plaque or stenosis. Negative right carotid bifurcation. Tortuous right ICA with no stenosis. Left carotid system: Similar tortuosity with no significant plaque or stenosis. Vertebral arteries: Mildly tortuous proximal right subclavian artery with no plaque or stenosis. Normal right vertebral artery origin. Patent right vertebral artery to the skull base with mild  tortuosity, no plaque or stenosis. Normal proximal left subclavian artery and left vertebral artery origin. Codominant left vertebral artery with mild tortuosity is patent to the skull base. No significant plaque or stenosis. CTA HEAD Early intracranial contrast timing. Posterior circulation: Distal vertebral arteries, vertebrobasilar junction, and both PICA origins appear patent without stenosis. Patent basilar artery without stenosis. Patent SCA and PCA origins, fetal type bilateral PCAs. Bilateral PCA branches are within normal limits. Anterior circulation: Both ICA siphons are patent. Mild left siphon cavernous and anterior genu calcified plaque without stenosis. Similar mild right siphon calcified plaque without significant stenosis. Both posterior communicating artery origins  appear normal. Patent carotid termini, MCA and ACA origins. Anterior communicating artery and bilateral ACA branches are within normal limits. Left MCA M1 segment and bifurcation are patent without stenosis. Right MCA M1 segment and bifurcation are patent without stenosis. Visible bilateral MCA branches appear relatively symmetric and within normal limits. Venous sinuses: Early contrast timing, not evaluated. Anatomic variants: Fetal type bilateral PCA origins. Review of the MIP images confirms the above findings IMPRESSION: 1. CTA is Negative for large vessel occlusion, with suboptimal intracranial contrast timing. 2. No infarct core detected by CT Perfusion, and likely artifactual small volume of T-max parameter abnormality (mostly at the anterior inferior frontal lobes). 3. Mild for age atherosclerosis in the head and neck. No significant arterial stenosis identified. 4. 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 Electronically Signed   By: Genevie Ann M.D.   On: 09/22/2022 09:58   CT HEAD CODE STROKE WO CONTRAST`  Result Date: 09/22/2022 CLINICAL DATA:  Code stroke.  72 year old male EXAM: CT  HEAD WITHOUT CONTRAST TECHNIQUE: Contiguous axial images were obtained from the base of the skull through the vertex without intravenous contrast. RADIATION DOSE REDUCTION: This exam was performed according to the departmental dose-optimization program which includes automated exposure control, adjustment of the mA and/or kV according to patient size and/or use of iterative reconstruction technique. COMPARISON:  Head CT 08/31/2022, brain MRI 04/28/2022. FINDINGS: Brain: Cerebral volume remains normal for age. No midline shift, ventriculomegaly, mass effect, evidence of mass lesion, intracranial hemorrhage or evidence of cortically based acute infarction. Gray-white differentiation appears stable and within normal limits for age throughout. Vascular: Mild Calcified atherosclerosis at the skull base. Stable. No suspicious intracranial vascular hyperdensity. Skull: No acute osseous abnormality identified. Sinuses/Orbits: Visualized paranasal sinuses and mastoids are stable and well aerated. Other: Mild rightward gaze.  Stable scalp soft tissues. ASPECTS [facility] Stroke Program Early CT Score) Total score (0-10 with 10 being normal): 10 IMPRESSION: 1. Stable and normal for age noncontrast CT appearance of the brain. ASPECTS 10. 2. These results were communicated to Dr. Leonel Ramsay at 9:07 am on 09/22/2022 by text page via the [facility] messaging system. Electronically Signed   By: Genevie Ann M.D.   On: 09/22/2022 09:07      Assessment/Plan Principal Problem:   Acute metabolic encephalopathy Active Problems:   Acute hepatic encephalopathy (HCC)   HTN (hypertension)   Chronic diastolic CHF (congestive heart failure) (HCC)   COPD (chronic obstructive pulmonary disease) (HCC)   MDD (major depressive disorder)   HLD (hyperlipidemia)   Diabetes mellitus without complication (HCC)   Thrombocytopenia (HCC)   Liver lesion   Liver cirrhosis (HCC)   Unsheltered homelessness   Obesity (BMI 30-39.9)   Assessment and  Plan:  Acute metabolic encephalopathy: Etiology is not clear.  Potential differential diagnoses include stroke and hepatic encephalopathy.  CT head negative.  CT angiogram of head and neck negative for LVO.  Consulted Dr. Leonel Ramsay of neurology.   -Admit to tele med bed as inpatient - Obtain MRI-brain  - will hold oral Bp meds to allow permissive HTN  - ASA and lipitor - fasting lipid panel and HbA1c  - swallowing screen. If fails, will get SLP - PT/OT consult -check ammonia level, CBC, urinalysis  Addendum: Possible hepatic encephalopathy: Ammonia level 67 -Start lactulose 30 g twice daily   HTN (hypertension): -IV hydralazine for SBP>220 or dBP>110 -hold lisinopril and Lasix  Chronic diastolic CHF (congestive heart failure) ([place]): 2D echo on 03/25/2022 showed EF of 60 to  65%.  No leg edema or JVD.  No oxygen desaturation.  CHF seem to be compensated. -Hold Lasix in the setting of acute stroke -Check BNP  COPD (chronic obstructive pulmonary disease) ([place]): -As needed bronchodilators  MDD (major depressive disorder): Admitted due to depression and suicidal ideation.  Patient is confused, not sure if patient has suicidal or homicidal ideations currently -Continue hydroxyzine -discontinue Seroquel and Lexapro as recommended by Dr. Leonel Ramsay  HLD (hyperlipidemia) -Lipitor  Diabetes mellitus without complication [facility]): Recent A1c 4.9, well-controlled.  Patient taking Jardiance and metformin -Sliding scale insulin  Thrombocytopenia ([place]): This is chronic issue.  Likely due to liver cirrhosis -Follow with CBC  Liver lesion: Per recent discharge summary by Dr. Dwyane Dee on 09/02/22,  "MRI: 2.5 cm posterior right hepatic lobe lesion, iaging features are compatible with a LI-RADS 4 lesion, probably hepatocellular carcinoma. Consider repeat MRI after resolution of acute symptoms when patient is better able to participate in positioning and reproducible breath holding. Liver cirrhosis,  portal hypertension, gastric wall thickening possibly gastritis, diffuse body wall edema. Patient was advised to follow with PCP to repeat MRI liver as an outpatient and may need biopsy as an outpatient". Pt had AFP < 1.8.  Liver cirrhosis ([place]):  -check INR/PTT -check ammonia level  Unsheltered homelessness -consult TOC  Obesity (BMI 30-39.9): Body weight 111 kg, BMI 39.50 -Patient needs to be counseled about importance of healthy diet and exercise, needs to encourage patient losing weight when his mental status improved.      DVT ppx:  SQ Lovenox  Code Status: Full code  Family Communication: not done, no family member is at bed side.    Disposition Plan:  Anticipate discharge back to previous environment  Consults called:  Dr. Leonel Ramsay of neuro  Admission status and Level of care: Telemetry Medical:  as inpt      Dispo: The patient is from:  Homeless              Anticipated d/c is to:  to be determined              Anticipated d/c date is: 2 days              Patient currently is not medically stable to d/c.    Severity of Illness:  The appropriate patient status for this patient is INPATIENT. Inpatient status is judged to be reasonable and necessary in order to provide the required intensity of service to ensure the patient's safety. The patient's presenting symptoms, physical exam findings, and initial radiographic and laboratory data in the context of their chronic comorbidities is felt to place them at high risk for further clinical deterioration. Furthermore, it is not anticipated that the patient will be medically stable for discharge from the hospital within 2 midnights of admission.   * I certify that at the point of admission it is my clinical judgment that the patient will require inpatient hospital care spanning beyond 2 midnights from the point of admission due to high intensity of service, high risk for further deterioration and high frequency of  surveillance required.*       Date of Service 09/22/2022    Ivor Costa [facility]   If 7PM-7AM, please contact night-coverage [email or web address] 09/22/2022, 4:33 PM

## 2022-09-22 NOTE — Progress Notes (Signed)
PHARMACIST - PHYSICIAN COMMUNICATION  CONCERNING:  Enoxaparin (Lovenox) for DVT Prophylaxis    RECOMMENDATION: Patient was prescribed enoxaprin '40mg'$  q24 hours for VTE prophylaxis.   There were no vitals filed for this visit.  There is no height or weight on file to calculate BMI.  Estimated Creatinine Clearance: 92.7 mL/min (by C-G formula based on SCr of 0.83 mg/dL).   Based on [place] patient is candidate for enoxaparin 0.'5mg'$ /kg TBW SQ every 24 hours based on BMI being >30.   DESCRIPTION: Pharmacy has adjusted enoxaparin dose per [facility] policy.  Patient is now receiving enoxaparin 55 mg every 24 hours   Pernell Dupre, PharmD, BCPS Clinical Pharmacist 09/22/2022 1:36 PM

## 2022-09-22 NOTE — Progress Notes (Signed)
   09/22/22 0800  Spiritual Encounters  Type of Visit Initial  Care provided to: Patient  Reason for visit Code  OnCall Visit Yes  Spiritual Framework  Presenting Themes Significant life change  Patient Stress Factors None identified  Interventions  Spiritual Care Interventions Made Compassionate presence  Spiritual Care Plan  Spiritual Care Issues Still Outstanding Referring to oncoming chaplain for further support

## 2022-09-22 NOTE — Discharge Summary (Signed)
Physician Discharge Summary Note  Patient:  [name] is an 72 y.o., male MRN:  [id number] DOB:  [birth year] Patient phone:  [phone] (home)  Patient address:   [address] [place] [address],  Total Time spent with patient: 1 hour  Date of Admission:  09/17/2022 Date of Discharge: 09/22/2022  Reason for Admission:  [name] is a 72 year old white male who was voluntarily admitted to inpatient psychiatry for suicidal ideation.  He is homeless and lives in a tent in [place].  He tells me that he has PTSD from his brief training in the [place] when he was 72 years old.  Apparently, someone was killed in front of him.  He is currently on disability.  He does have a Psychiatrist who lives in [place] but is also homeless.  He last had housing in 2018 but was evicted after the landlord was a fraud.  He sees Dr. Posey Pronto at the [facility] in [place].  He has a history of blindness, deaf, CHF, hypertension, liver and kidney disease, and type 2 diabetes.  He endorses anhedonia, difficulty sleeping, depressed mood, anxiety and hallucinations when he has flashbacks.  He was last psychiatrically hospitalized in the 1990s and does not have any past suicide attempts.  He currently does not have a psychiatrist.  He was seen in the emergency room recently for chest pain and a URI and given a prescription for Tessalon.  More recently seen by psychiatric nurse practitioner because he stated that he was going to cut his wrists.  He informs me that he does not do well on tricyclic antidepressants.   Principal Problem: MDD (major depressive disorder) Discharge Diagnoses: Principal Problem:   MDD (major depressive disorder)   Past Psychiatric History: As above  Past Medical History:  Past Medical History:  Diagnosis Date   Asthma    CHF (congestive heart failure) (HCC)    COPD (chronic obstructive pulmonary disease) ([place])    COVID-19 03/2020   diagnosed in August 2021   Diabetes  mellitus without complication ([place])    Homelessness    Hypertension    Infestation by bed bug    Migraine    Obesity    Sleep apnea     Past Surgical History:  Procedure Laterality Date   CARDIAC CATHETERIZATION     CHOLECYSTECTOMY     EYE SURGERY     INNER EAR SURGERY     NOSE SURGERY     Family History:  Family History  Problem Relation Age of Onset   Hypertension Mother    Heart disease Mother    Heart failure Maternal Grandmother    Family Psychiatric  History: Unremarkable Social History:  Social History   Substance and Sexual Activity  Alcohol Use Never     Social History   Substance and Sexual Activity  Drug Use Never    Social History   Socioeconomic History   Marital status: Single    Spouse name: Not on file   Number of children: 2   Years of education: 12   Highest education level: 12th grade  Occupational History   Occupation: retired  Tobacco Use   Smoking status: Former   Smokeless tobacco: Never  Scientific laboratory technician Use: Not on file  Substance and Sexual Activity   Alcohol use: Never   Drug use: Never   Sexual activity: Never    Birth control/protection: None  Other Topics Concern   Not on file  Social History  Narrative   Not on file   Social Determinants of Health   Financial Resource Strain: High Risk (04/27/2022)   Overall Financial Resource Strain (CARDIA)    Difficulty of Paying Living Expenses: Very hard  Food Insecurity: No Food Insecurity (09/17/2022)   Hunger Vital Sign    Worried About Running Out of Food in the Last Year: Never true    Ran Out of Food in the Last Year: Never true  Transportation Needs: No Transportation Needs (09/17/2022)   PRAPARE - Hydrologist (Medical): No    Lack of Transportation (Non-Medical): No  Physical Activity: Sufficiently Active (04/16/2018)   Exercise Vital Sign    Days of Exercise per Week: 7 days    Minutes of Exercise per Session: 30 min  Stress: Stress  Concern Present (04/16/2018)   [place]    Feeling of Stress : Very much  Social Connections: Moderately Isolated (04/30/2018)   Social Connection and Isolation Panel [NHANES]    Frequency of Communication with Friends and Family: Twice a week    Frequency of Social Gatherings with Friends and Family: Never    Attends Religious Services: More than 4 times per year    Active Member of [employer] or Organizations: No    Attends Archivist Meetings: Never    Marital Status: Widowed    Hospital Course: [name] is a 72 year old white male who was voluntarily admitted to inpatient psychiatry for worsening depression and suicidal ideation.  [name] has been homeless but receives disability.  He is unable to obtain housing because he is on the sex offenders list.  He has not followed up with psychiatry in many years.  While on the inpatient unit he was very pleasant and cooperative and he was initiated on Celexa 20 mg/day and 50 mg of Seroquel at night.  He did very well with the medications.  He was able contract for safety in the hospital.  His mood improved.  Unfortunately, on the day of discharge nurses noted that he was unresponsive but his vital signs were normal.  CT of the head showed possible stroke and he was transferred neurology.  Physical Findings: AIMS:  , ,  ,  ,    CIWA:    COWS:     Musculoskeletal: Strength & Muscle Tone: within normal limits Gait & Station:  Unknown Patient leans: N/A   Psychiatric Specialty Exam:  Presentation  General Appearance:  Disheveled  Eye Contact: Good  Speech: Clear and Coherent  Speech Volume: Normal  Handedness: Right   Mood and Affect  Mood: Euphoric  Affect: Congruent   Thought Process  Thought Processes: Coherent  Descriptions of Associations:Intact  Orientation:Full (Time, Place and Person)  Thought Content:WDL  History of  Schizophrenia/Schizoaffective disorder:No  Duration of Psychotic Symptoms:Less than six months  Hallucinations:No data recorded Ideas of Reference:None  Suicidal Thoughts:No data recorded Homicidal Thoughts:No data recorded  Sensorium  Memory: Immediate Good; Recent Good  Judgment: Fair  Insight: Poor   Executive Functions  Concentration: Good  Attention Span: Good  Recall: Good  Fund of Knowledge: Poor  Language: Fair   Psychomotor Activity  Psychomotor Activity:No data recorded  Assets  Assets: Financial Resources/Insurance; Resilience   Sleep  Sleep:No data recorded   Physical Exam: Physical Exam Vitals and nursing note reviewed.  Constitutional:      Appearance: Normal appearance. He is normal weight.  Neurological:     Mental Status: He  is alert. He is disoriented.  Psychiatric:        Attention and Perception: He is inattentive.        Mood and Affect: Mood normal. Affect is flat.        Speech: He is noncommunicative.        Behavior: Behavior normal. Behavior is cooperative.        Cognition and Memory: Cognition is impaired.        Judgment: Judgment is inappropriate.    Review of Systems  Constitutional: Negative.   HENT: Negative.    Eyes: Negative.   Respiratory: Negative.    Cardiovascular: Negative.   Gastrointestinal: Negative.   Genitourinary: Negative.   Musculoskeletal: Negative.   Skin: Negative.   Neurological: Negative.   Endo/Heme/Allergies: Negative.   Psychiatric/Behavioral: Negative.     Blood pressure (!) 162/69, pulse (!) 54, temperature (!) 97.5 F (36.4 C), temperature source Oral, resp. rate 16, height '5\' 6"'$  (1.676 m), weight 111 kg, SpO2 100 %. Body mass index is 39.5 kg/m.   Social History   Tobacco Use  Smoking Status Former  Smokeless Tobacco Never   Tobacco Cessation:  Prescription not provided because: He was transferred to neurology.   Blood Alcohol level:  Lab Results  Component Value  Date   ETH <10 09/15/2022   ETH <10 17/49/4496    Metabolic Disorder Labs:  Lab Results  Component Value Date   HGBA1C 4.9 09/15/2022   MPG 93.93 09/15/2022   MPG 108.28 03/26/2022   No results found for: "PROLACTIN" Lab Results  Component Value Date   CHOL 92 04/29/2022   TRIG 51 04/29/2022   HDL 25 (L) 04/29/2022   CHOLHDL 3.7 04/29/2022   VLDL 10 04/29/2022   LDLCALC 57 04/29/2022   LDLCALC 100 (H) 10/04/2021    See Psychiatric Specialty Exam and Suicide Risk Assessment completed by Attending Physician prior to discharge.  Discharge destination:  Other:  Hospital services with neurology.  Is patient on multiple antipsychotic therapies at discharge:  No   Has Patient had three or more failed trials of antipsychotic monotherapy by history:  No  Recommended Plan for Multiple Antipsychotic Therapies: NA   Allergies as of 09/22/2022       Reactions   Bee Venom Anaphylaxis   Codeine Itching   Only itching. Recently allergy tested at [facility]   Novocain [procaine]    Sulfa Antibiotics Other (See Comments)        Medication List     STOP taking these medications    EPINEPHrine 0.3 mg/0.3 mL Soaj injection Commonly known as: EPI-PEN       TAKE these medications      Indication  albuterol (2.5 MG/3ML) 0.083% nebulizer solution Commonly known as: PROVENTIL Take 3 mLs by nebulization every 4 (four) hours as needed for wheezing or shortness of breath.    albuterol 108 (90 Base) MCG/ACT inhaler Commonly known as: VENTOLIN HFA Inhale 2 puffs into the lungs every 6 (six) hours as needed for wheezing or shortness of breath.    ascorbic acid 500 MG tablet Commonly known as: VITAMIN C Take 1 tablet (500 mg total) by mouth daily.    aspirin EC 81 MG tablet Take 1 tablet (81 mg total) by mouth daily. Swallow whole.    atorvastatin 40 MG tablet Commonly known as: LIPITOR Take 1 tablet (40 mg total) by mouth daily.    citalopram 20 MG tablet Commonly known as:  CELEXA Take 1 tablet (20 mg total)  by mouth daily.  Indication: Major Depressive Disorder   furosemide 20 MG tablet Commonly known as: LASIX Take 20 mg by mouth every morning.    glucosamine-chondroitin 500-400 MG tablet Take 1 tablet by mouth 3 (three) times daily.    hydrOXYzine 10 MG tablet Commonly known as: ATARAX Take 10 mg by mouth 3 (three) times daily as needed.    Jardiance 10 MG Tabs tablet Generic drug: empagliflozin Take 1 tablet (10 mg total) by mouth daily.    lansoprazole 30 MG capsule Commonly known as: PREVACID Take 30 mg by mouth daily.    lisinopril 40 MG tablet Commonly known as: ZESTRIL Take 1 tablet (40 mg total) by mouth daily.    metFORMIN 500 MG 24 hr tablet Commonly known as: GLUCOPHAGE-XR Take 1 tablet (500 mg total) by mouth daily.    multivitamin with minerals Tabs tablet Take 1 tablet by mouth daily.    nitroGLYCERIN 0.4 MG SL tablet Commonly known as: NITROSTAT Place 1 tablet (0.4 mg total) under the tongue every 5 (five) minutes x 3 doses as needed for chest pain.    QUEtiapine 50 MG tablet Commonly known as: SEROQUEL Take 1 tablet (50 mg total) by mouth at bedtime.  Indication: Major Depressive Disorder         Follow-up recommendations:  [facility] upon discharge    Signed: Parks Ranger, DO 09/22/2022, 9:39 AM

## 2022-09-22 NOTE — Progress Notes (Signed)
Report called and given to Hurman Horn RN, no further question for this writer at this time.

## 2022-09-22 NOTE — Consult Note (Signed)
Neurology Consultation Reason for Consult: AMS Referring Physician: Code Stroke  CC: AMS  History is obtained from:Chart Review  HPI: [name] is a 72 y.o. male with a history of HTN, migraine who was last his normal sel fyesterday. He apparently had some confusion overnight which was presumably attributed to sundowning, but this morning at 8:10 he was assessed and note to be very confused which is different from previous days.    A code stroke was activated and he was evaluated in the CT scanner.  LKW: yesterday evening, 7pm  tnk given?: no, out of window  Past Medical History:  Diagnosis Date   Asthma    CHF (congestive heart failure) (HCC)    COPD (chronic obstructive pulmonary disease) ([place])    COVID-19 03/2020   diagnosed in August 2021   Diabetes mellitus without complication ([place])    Homelessness    Hypertension    Infestation by bed bug    Migraine    Obesity    Sleep apnea      Family History  Problem Relation Age of Onset   Hypertension Mother    Heart disease Mother    Heart failure Maternal Grandmother      Social History:  reports that he has quit smoking. He has never used smokeless tobacco. He reports that he does not drink alcohol and does not use drugs.   Exam: Current vital signs: BP (!) 162/69   Pulse (!) 54   Temp (!) 97.5 F (36.4 C) (Oral)   Resp 16   Ht '5\' 6"'$  (1.676 m)   Wt 111 kg   SpO2 100%   BMI 39.50 kg/m  Vital signs in last 24 hours: Temp:  [97.5 F (36.4 C)-98.9 F (37.2 C)] 97.5 F (36.4 C) (01/27 0809) Pulse Rate:  [51-54] 54 (01/27 0809) Resp:  [16-19] 16 (01/27 0809) BP: (134-162)/(62-69) 162/69 (01/27 0809) SpO2:  [100 %] 100 % (01/27 0809)   Physical Exam  Appears well-developed and well-nourished.   Neuro: Mental Status: Patient is awake, he is mumbling.  He is able to tell me his name, and answer simple questions.  He fixates on the fact that he has to urinate. Cranial Nerves: II: He does respond to  visual stimuli, but he does not clearly blink to threat from either direction, he does not cooperate for formal field testing.. Pupils are equal, round, and reactive to light.   III,IV, VI: Eyes are midline and is difficult to get him to look in either direction, I do see and look to the right a single time, but mostly he stares straight ahead.Marland Kitchen  VII: No definite facial droop Motor: He moves all extremities,  but does appear to move his right side more than his left, though I do not feel this is a definite focal weakness.  S ensory: He responds to noxious stimulation in all four extremities.   Deep Tendon Reflexes: No clonus, he does not cooperate well with formal reflex testing but no obvious hyperreflexia.   Cerebellar: He would not perform.     I have reviewed labs in epic and the results pertinent to this consultation are: Results for orders placed or performed during the hospital encounter of 09/17/22 (from the past 24 hour(s))  Glucose, capillary     Status: Abnormal   Collection Time: 09/21/22 11:48 AM  Result Value Ref Range   Glucose-Capillary 123 (H) 70 - 99 mg/dL  Glucose, capillary     Status: None  Collection Time: 09/21/22  4:37 PM  Result Value Ref Range   Glucose-Capillary 88 70 - 99 mg/dL  Glucose, capillary     Status: None   Collection Time: 09/21/22  7:59 PM  Result Value Ref Range   Glucose-Capillary 99 70 - 99 mg/dL  Glucose, capillary     Status: None   Collection Time: 09/22/22  8:05 AM  Result Value Ref Range   Glucose-Capillary 98 70 - 99 mg/dL     I have reviewed the images obtained:CT/CTA - negative  Impression: 72 yo M with AMS of unclear etiology in the setting of recent psychiatric disease.  Certainly in the setting of prolonged hospitalization, delirium is a possibility.  He will need to be evaluated for UTI, other occult infection.  Medication effect I feel is less likely, he was started on Seroquel and so NMS may be a consideration but he is  not particularly rigid.  Serotonin syndrome is less likely given the lack of hyperreflexia.  Recommendations: 1) discontinue Seroquel and Lexapro 2) MRI brain 3) UA, ammonia, TSH, cbc with diff 4) Will follow   Roland Rack, MD [facility] [phone]  If 7pm- 7am, please page neurology on call as listed in [place].

## 2022-09-22 NOTE — BHH Suicide Risk Assessment (Signed)
[facility] Discharge Suicide Risk Assessment   Principal Problem: MDD (major depressive disorder) Discharge Diagnoses: Principal Problem:   MDD (major depressive disorder)   Total Time spent with patient: 1 hour  Musculoskeletal: Strength & Muscle Tone: decreased Gait & Station: unable to stand Patient leans: N/A  Psychiatric Specialty Exam  Presentation  General Appearance:  Disheveled  Eye Contact: Good  Speech: Clear and Coherent  Speech Volume: Normal  Handedness: Right   Mood and Affect  Mood: Euphoric  Duration of Depression Symptoms: Greater than two weeks  Affect: Congruent   Thought Process  Thought Processes: Coherent  Descriptions of Associations:Intact  Orientation:Full (Time, Place and Person)  Thought Content:WDL  History of Schizophrenia/Schizoaffective disorder:No  Duration of Psychotic Symptoms:Less than six months  Hallucinations:No data recorded Ideas of Reference:None  Suicidal Thoughts:No data recorded Homicidal Thoughts:No data recorded  Sensorium  Memory: Immediate Good; Recent Good  Judgment: Fair  Insight: Poor   Executive Functions  Concentration: Good  Attention Span: Good  Recall: Good  Fund of Knowledge: Poor  Language: Fair   Psychomotor Activity  Psychomotor Activity:No data recorded  Assets  Assets: Financial Resources/Insurance; Resilience   Sleep  Sleep:No data recorded   Blood pressure (!) 162/69, pulse (!) 54, temperature (!) 97.5 F (36.4 C), temperature source Oral, resp. rate 16, height '5\' 6"'$  (1.676 m), weight 111 kg, SpO2 100 %. Body mass index is 39.5 kg/m.  Mental Status Per Nursing Assessment::   On Admission:  Self-harm thoughts  Demographic Factors:  Male, Age 72 or older, Caucasian, and Unemployed  Loss Factors: NA  Historical Factors: NA  Risk Reduction Factors:   NA  Continued Clinical Symptoms:  Depression:   Anhedonia  Cognitive Features That  Contribute To Risk:  None    Suicide Risk:  Minimal: No identifiable suicidal ideation.  Patients presenting with no risk factors but with morbid ruminations; may be classified as minimal risk based on the severity of the depressive symptoms    Plan Of Care/Follow-up recommendations: [place], DO 09/22/2022, 9:32 AM

## 2022-09-22 NOTE — Progress Notes (Signed)
Pt cleaned and changed prior to transferring to new unit. Pt belongings and sitter accompanying pt to new unit.

## 2022-09-23 DIAGNOSIS — G9341 Metabolic encephalopathy: Secondary | ICD-10-CM | POA: Diagnosis not present

## 2022-09-23 LAB — LIPID PANEL
Cholesterol: 140 mg/dL (ref 0–200)
HDL: 40 mg/dL — ABNORMAL LOW (ref >40)
LDL Cholesterol: 92 mg/dL (ref 0–99)
Total CHOL/HDL Ratio: 3.5 RATIO
Triglycerides: 41 mg/dL (ref <150)
VLDL: 8 mg/dL (ref 0–40)

## 2022-09-23 LAB — GLUCOSE, CAPILLARY
Glucose-Capillary: 78 mg/dL (ref 70–99)
Glucose-Capillary: 104 mg/dL — ABNORMAL HIGH (ref 70–99)
Glucose-Capillary: 105 mg/dL — ABNORMAL HIGH (ref 70–99)
Glucose-Capillary: 99 mg/dL (ref 70–99)

## 2022-09-23 LAB — RPR: RPR Ser Ql: NONREACTIVE

## 2022-09-23 LAB — AMMONIA: Ammonia: 47 umol/L — ABNORMAL HIGH (ref 9–35)

## 2022-09-23 LAB — HEMOGLOBIN A1C
Hgb A1c MFr Bld: 4.8 % (ref 4.8–5.6)
Mean Plasma Glucose: 91.06 mg/dL

## 2022-09-23 MED ORDER — CITALOPRAM HYDROBROMIDE 20 MG PO TABS
20.0000 mg | ORAL_TABLET | Freq: Every day | ORAL | Status: DC
  Administered 2022-09-23 – 2022-09-24 (×2): 20 mg via ORAL
  Filled 2022-09-23 (×2): qty 1

## 2022-09-23 MED ORDER — SALINE SPRAY 0.65 % NA SOLN
1.0000 | NASAL | Status: DC | PRN
  Filled 2022-09-23: qty 44

## 2022-09-23 MED ORDER — LISINOPRIL 20 MG PO TABS
20.0000 mg | ORAL_TABLET | Freq: Every day | ORAL | Status: DC
  Administered 2022-09-23 – 2022-09-24 (×2): 20 mg via ORAL
  Filled 2022-09-23 (×2): qty 1

## 2022-09-23 MED ORDER — QUETIAPINE FUMARATE 25 MG PO TABS
50.0000 mg | ORAL_TABLET | Freq: Every day | ORAL | Status: DC
  Administered 2022-09-23: 50 mg via ORAL
  Filled 2022-09-23: qty 2

## 2022-09-23 NOTE — Discharge Instructions (Signed)
Food Resources  Agency Name: [facility] Agency Address: [address], [place], [place] [address] Phone: [phone] Website: www.alamanceservices.org  Service(s) Offered: Housing services, self-sufficiency, congregate meal  program, weatherization program, Administrator, sports program, emergency food assistance,  housing counseling, home ownership program, wheels -towork program. Meals free for 60 and older at various  locations from 9am-1pm, Monday-Friday:  [employer], [place], [place], [address], [place]   [facility], [place] [phone] The [address], [address]   [place], [place]  Agency Name: [facility] Address: [place] [address] [place], [place], [place] [address] Phone: [phone] Website: www.alamancemow.org Service(s) Offered: Home delivered hot, frozen, and emergency  meals. Grocery assistance program which matches  volunteers one-on-one with seniors unable to grocery shop  for themselves. Must be 60 years and older; less than 20  hours of in-home aide service, limited or no driving ability;  live alone or with someone with a disability; live in  [place].  Agency Name: Software engineer ([place]) Address: [address] [place], [place] [address] Phone: [phone] Service(s) Offered: Food is served to [place], homeless, elderly, and low  income people in the community every Saturday (11:30  am-12:30 pm) and Sunday (12:30 pm-1:30pm). Volunteers  also offer help and encouragement in seeking employment,  and spiritual guidance. December 20, 2016 8  Agency Name: Department of Social Services Address: [address] [place], [place] [address] Phone: [phone] Service(s) Offered: Child support services; child welfare services; food stamps;  Medicaid;  work first family assistance; and aid with fuel,  rent, food and medicine.  Agency Name: Chemical engineer Address: [address], [place], [place] Phone: [phone] Website: www.dreamalign.com Services Offered: Monday 10:00am-12:00, 8:00pm-9:00pm, and Friday  10:00am-12:00. Agency Name: [employer] [facility] [place] Address: [address] [place], [place] [address] Phone: [phone] Website: www.[email or web address].org Service(s) Offered: Serves weekday meals, open from 11:30 am- 1:00 pm., and  6:30-7:30pm, Monday-Wednesday-Friday distributes food  3:30-6pm, Monday-Wednesday-Friday.  Agency Name: [employer] Address: [address] [place], [place] Phone: [phone] Website: [email or web address] Services Offered: Distributes food the 4th Saturday of the month, starting at  8:00 am Agency Name: [facility] Address: [id number] [place], [place] [address] Phone: [phone] Website: [email or web address]://[email or web address] Service(s) Offered: Bread of life, weekly food pantry. Open Wednesdays from  10:00am-noon.  Agency Name: [facility] Address: [place], [place], [place] Phone: [phone] Services Offered: Distributes food 9am-1pm, Monday-Thursday. Call for details.   Agency Name: [place] Address: [address], [place], [place] [address] Phone: [phone] Website: firstbaptistburlington.com Service(s) Offered: Youth worker. Call for assistance. Agency Name: [facility] Address: [address] [place], [place] [address] Phone: [phone] Service Offered: Emergency Food Pantry. Call for appointment.  Agency Name: [place] Address: [address], [place], [place] [address] Phone: [phone] Website: [email or web address] Services Offered: Youth worker. Call for details Agency Name: [facility] Address: [place], [place] Phone: [phone] Website: [email or web address] Service(s) Offered: Emergency Food Pantry. Call for details.  Agency Name: Solicitor Address: [place]. [address], [place],  [address] Phone: [phone] or [phone] Website: [email or web address]://[email or web address] Service(s) Offered: Distribute food 9am-11:30 am, Tuesday-Friday, and 1- 3:30pm, Monday-Friday. Food pantry Monday-Friday  1pm-3pm, fresh items, Mon.-Wed.-Fri.  Agency Name: [facility] [facility] Address: [phone]  [place], [place] [address] Phone: [phone] Website: www.safealamance.org Services Offered: Distribute food Tues and Sats from 9:00am-noon. Closed  1st Saturday of each month. Call for details     Rent/Utility/Housing Resources Agency Name: [facility] Agency Address: [address] [place], [place] [address] Phone: [phone] Email: [email or web address] Website: www.[email or web address].org Service(s) Offered: Housing services, self-sufficiency, congregate meal  program, weatherization program, Administrator, sports program, emergency food assistance,  housing counseling, home ownership program, wheels -towork program.  Agency Name: [place] Address: [address], [place], [place] [address] Phone: [phone] (8p- 10p) Email: [email or web address] Website: www.[email or web address].org Service(s) Offered: A program for homeless and/or needy men that includes one-on-one counseling, life skills training and job rehabilitation.  Agency Name: [employer] [facility] [place] Address: [address] [place], [place] [address] Phone: [phone] Website: www.alliedchurches.org Service(s) Offered: Assistance to needy in emergency with utility bills, heating  fuel, and prescriptions. Shelter for homeless 7pm-7am. December 20, 2016 15  Agency  Name: [facility] [place] (Developmentally Disabled) Address: [address], [place], [place] [address] Phone: [phone] Contact Person: Susanne Greenhouse Email: [email or web address] Website: [email or web address] Service(s) Offered: Helps individuals with developmental disabilities move  from housing that is more restrictive to homes where they  can achieve greater independence and have more  opportunities.  Agency Name: [employer] Address: [address], [place], [place] [address] Phone: [phone] Email: [email or web address] Website: www.burlingtonhousingauthority.org Service(s) Offered: Provides affordable housing for low-income families,  elderly, and disabled individuals. Offer a wide range of  programs and services, from financial planning to afterschool and summer programs.  Agency Name: [place] Address: [address] [place], [place] [address] Phone: [phone] Service(s) Offered: Child support services; child welfare services; food stamps;  Medicaid; work first family assistance; and aid with fuel,  rent, food and medicine.  Agency Name: [facility] [place]. Address: [facility] [address] [place], [place]  [address] Phone: [phone] Website: www.familyabuseservices.org Service(s) Offered: 24 hour Crisis Line: [phone]; 24 hour Emergency Shelter;  Transitional Housing; Support Groups; Lexicographer;  [employer]; Hispanic Outreach: [phone];  [place]: [phone]. December 20, 2016 16  Agency Name: [place]. Address: [address], [place], [place] [address] Phone: [phone] Service(s) Offered: CAP Services; Home and [employer]; Individual  or Group Supports; Respite Care Non-Institutional Nursing;  Residential Supports; Respite Care and [place]; Transportation; Family and Friends Night;  Recreational Activities;  Three Nutritious Meals/Snacks;  Consultation with Registered Dietician; Twenty-four hour  Registered Nurse Access; Daily and [employer]; [place]; [place] for the [employer] (During Summer Months) Bingo Night (Every  Wednesday Night); Special Populations Dance Night  (Every Tuesday Night); Professional Hair Care Services.  Agency Name: [facility] Address: [address], [place], [place] [address] Phone: [phone] Contact Person: Flonnie Hailstone Website: [email or web address].Pharmacist, hospital) Offered: Residential treatment facility for women; food and  clothing, educational & employment development and  transportation to work; Psychologist, counselling of financial skills;  parenting and family reunification; emotional and spiritual  support; transitional housing for program graduates.  Agency Name: [employer] Address: [address], [place], [place] [address] Phone: [phone] Email: [email or web address] Website: http://www.[email or web address]/ Service(s) Offered: Public housing units for elderly, disabled, and low income  people; housing choice vouchers for income eligible  applicants; shelter plus care vouchers; and [employer] program. December 20, 2016 17  Agency Name:  [facility] Address: [place] [address] [place], [place] [address] Phone: [phone] Email: [email or web address] Website: www.habitatalamance.org Service(s) Offered: Build houses for families in need of decent housing. Each  adult in the family must invest 200 hours of labor on  someone else's house, work with volunteers to build their  own house, attend classes on budgeting, home maintenance, yard care, and attend homeowner association  meetings.  Agency Name: [facility] Address: [place] [address], [place], [place] [address] Phone: [phone] Website: www.rsli.org Service(s) Offered: Intermediate  care facilities for mentally retarded,  Supervised Living in group homes for adults with  developmental disabilities, Supervised Living for people  who have dual diagnoses (MRMI), Independent Living,  Supported Living, respite and a variety of CAP services,  pre-vocational services, day supports, and [employer].  Agency Name: [facility] Phone: [phone] Website: www.NCForeclosurePrevention.gov Service(s) Offered: Zero-interest, deferred loans to homeowners struggling to  pay their mortgage. Call for more information      Transportation Resources Agency Name: [facility] Agency Address: [address] [place], [place] [address] Phone: [phone] Email: [email or web address] Website: www.alamanceservices.org Service(s) Offered: Universal Health, self-sufficiency, congregate meal  program, weatherization program, Administrator, sports program, emergency food assistance,  housing counseling, home ownership program, wheels-towork program. December 20, 2016 22  Agency Name: [place] [phone]) Address: [employer], [place], [place] [address] Phone: [phone] Email:  Website: www.acta-[place].com Service(s) Offered: Transportation for general public, subscription and demand  response; Dial-a-Ride for citizens 55 years of age or older. Agency Name: Department of Social Services Address: [address] [place], [place] [address] Phone: [phone] Service(s) Offered: Child support services; child welfare services; food stamps;  Medicaid; work first family assistance; and aid with fuel,  rent, food and medicine, transportation assistance.  Agency Name: Disabled [employer] ([place]) Transportation  Network Address: Phone: [phone] Service(s) Offered: Transports veterans to the [facility] medical center. Call  forty-eight hours in advance and leave the name,  telephone  number, date, and time of appointment. Veteran will be  contacted by the driver the day before the appointment to  arrange a pick up point

## 2022-09-23 NOTE — Progress Notes (Signed)
Patient is awake, alert, interactive and appropriate.  He states that he has had problems with his ammonia in the past, and has responded well to lactulose then as well.  Given his history of liver disease, response to lactulose, I do not feel any further evaluation is needed at this time.  Please call if neurology be of any further assistance.  Roland Rack, MD [facility] [phone]  If 7pm- 7am, please page neurology on call as listed in [place].

## 2022-09-23 NOTE — TOC Initial Note (Signed)
Transition of Care [facility]) - Initial/Assessment Note    Patient Details  Name: [name] MRN: [id number] Date of Birth: [birth year]  [facility]) CM/SW Contact:    Magnus [name], LCSW Phone Number: 09/23/2022, 11:30 AM  Clinical Narrative:                 CSW spoke to patient regarding DC planning/PT OT recs.  Patient lives in a tent. He states he would like housing resources so he can look into long term housing options. Patient also agreed to food and transportation resources being added to his AVS. Resources added by CSW.  Patient states he does feel safe returning to his tent when he is DC from this hospital stay.  Patient states he has a cell phone but is not sure of the number and does not have his cell phone right now.  Patient confirmed his PCP is Dr. Posey Pronto. Pharmacy is Paediatric nurse on [facility]. Patient uses the bus for transportation.  Patient states he is agreeable to OPPT and would like to use [facility]. Referral sent in Epic.   Expected Discharge Plan: Homeless Shelter (tent) Barriers to Discharge: Continued Medical Work up   Patient Goals and CMS Choice   CMS Medicare.gov Compare Post Acute Care list provided to:: Patient Choice offered to / list presented to : Patient      Expected Discharge Plan and Services       Living arrangements for the past 2 months: Homeless                                      Prior Living Arrangements/Services Living arrangements for the past 2 months: Homeless   Patient language and need for interpreter reviewed:: Yes Do you feel safe going back to the place where you live?: Yes      Need for Family Participation in Patient Care: Yes (Comment) Care giver support system in place?: No (comment)   Criminal Activity/Legal Involvement Pertinent to Current Situation/Hospitalization: No - Comment as needed  Activities of Daily Living      Permission Sought/Granted Permission sought to  share information with : Facility Art therapist granted to share information with : Yes, Verbal Permission Granted     Permission granted to share info w AGENCY: [place]        Emotional Assessment       Orientation: : Oriented to Self, Oriented to Situation, Oriented to Place, Oriented to  Time Alcohol / Substance Use: Not Applicable Psych Involvement: Yes (comment)  Admission diagnosis:  Acute metabolic encephalopathy [W65.68] Patient Active Problem List   Diagnosis Date Noted   Acute metabolic encephalopathy 12/75/1700   Liver lesion 09/22/2022   Liver cirrhosis ([place]) 09/22/2022   HLD (hyperlipidemia) 09/22/2022   Diabetes mellitus without complication ([place]) 17/49/4496   Obesity (BMI 30-39.9) 09/22/2022   Acute hepatic encephalopathy ([place]) 09/22/2022   MDD (major depressive disorder) 09/17/2022   Major depressive disorder, 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   Protein-calorie malnutrition, severe ([place]) 08/31/2022   Hypotension 08/31/2022   Right sided weakness    Hx of transient ischemic attack (TIA) 04/28/2022   Obesity    Chronic diastolic CHF (congestive heart failure) ([place])    Depression    Sepsis ([place]) 03/28/2022   Bacteremia due to Klebsiella pneumoniae 03/28/2022   Hypoglycemia 03/28/2022   Syncope 03/24/2022  Hypokalemia 03/24/2022   Fever 03/24/2022   COPD (chronic obstructive pulmonary disease) (HCC)    Elevated troponin    Abnormal LFTs    History of hepatitis C    AKI (acute kidney injury) ([place])    Pure hypercholesterolemia    Obesity, Class III, BMI 40-49.9 (morbid obesity) ([place]) 04/18/2020   Thrombocytopenia (Home) 04/17/2020   Leukopenia 04/17/2020   HTN (hypertension) 04/17/2018   Uncontrolled type 2 diabetes mellitus with hyperglycemia, without long-term current use of insulin ([place]) 04/17/2018   Lymphedema 04/17/2018   Anemia 04/07/2018   Chest pain 03/20/2018   Unsheltered  homelessness 12/16/2017   PCP:  Denton Lank, MD Pharmacy:   [facility] [address] ([address]), [place]) [place] [address] Phone: [phone] Fax: [phone]     Social Determinants of Health (SDOH) Social History: SDOH Screenings   Food Insecurity: No Food Insecurity (09/17/2022)  Housing: Medium Risk (09/17/2022)  Transportation Needs: No Transportation Needs (09/17/2022)  Utilities: Not At Risk (09/17/2022)  Alcohol Screen: Low Risk  (04/30/2018)  Depression (PHQ2-9): High Risk (04/26/2022)  Financial Resource Strain: High Risk (04/27/2022)  Physical Activity: Sufficiently Active (04/16/2018)  Social Connections: Moderately Isolated (04/30/2018)  Stress: Stress Concern Present (04/16/2018)  Tobacco Use: Medium Risk (09/17/2022)   SDOH Interventions:     Readmission Risk Interventions     No data to display

## 2022-09-23 NOTE — Evaluation (Signed)
Speech Language Pathology Evaluation Patient Details Name: [name] MRN: [id number] DOB: 07-Jan-1951 Today's Date: 09/23/2022 Time: 4268-3419 SLP Time Calculation (min) (ACUTE ONLY): 15 min  Problem List:  Patient Active Problem List   Diagnosis Date Noted   Acute metabolic encephalopathy 62/22/9798   Liver lesion 09/22/2022   Liver cirrhosis ([place]) 09/22/2022   HLD (hyperlipidemia) 09/22/2022   Diabetes mellitus without complication ([place]) 92/06/9416   Obesity (BMI 30-39.9) 09/22/2022   Acute hepatic encephalopathy ([place]) 09/22/2022   MDD (major depressive disorder) 09/17/2022   Major depressive disorder, 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 psychotic features ([place]) 09/15/2022   Protein-calorie malnutrition, severe ([place]) 08/31/2022   Hypotension 08/31/2022   Right sided weakness    Hx of transient ischemic attack (TIA) 04/28/2022   Obesity    Chronic diastolic CHF (congestive heart failure) ([place])    Depression    Sepsis ([place]) 03/28/2022   Bacteremia due to Klebsiella pneumoniae 03/28/2022   Hypoglycemia 03/28/2022   Syncope 03/24/2022   Hypokalemia 03/24/2022   Fever 03/24/2022   COPD (chronic obstructive pulmonary disease) ([facility])    Elevated troponin    Abnormal LFTs    History of hepatitis C    AKI (acute kidney injury) ([place])    Pure hypercholesterolemia    Obesity, Class III, BMI 40-49.9 (morbid obesity) ([place]) 04/18/2020   Thrombocytopenia ([place]) 04/17/2020   Leukopenia 04/17/2020   HTN (hypertension) 04/17/2018   Uncontrolled type 2 diabetes mellitus with hyperglycemia, without long-term current use of insulin ([place]) 04/17/2018   Lymphedema 04/17/2018   Anemia 04/07/2018   Chest pain 03/20/2018   Unsheltered homelessness 12/16/2017   Past Medical History:  Past Medical History:  Diagnosis Date   Asthma    CHF (congestive heart failure) ([place])    COPD (chronic obstructive pulmonary disease) ([place])    COVID-19 03/2020   diagnosed in August 2021   Diabetes mellitus without  complication ([place])    Homelessness    Hypertension    Infestation by bed bug    Migraine    Obesity    Sleep apnea    Past Surgical History:  Past Surgical History:  Procedure Laterality Date   CARDIAC CATHETERIZATION     CHOLECYSTECTOMY     EYE SURGERY     INNER EAR SURGERY     NOSE SURGERY     HPI:  Per H&P "FINNEAS MATHE is a 72 y.o. male with medical history significant of homeless, depression, anxiety, hypertension, hyperlipidemia, diabetes mellitus, COPD, diastolic CHF, TIA, migraine headache, hepatitis C, liver cirrhosis, liver lesion (suspecting cancer), who presents with altered mental status.     Patient was initially admitted to behavioral health on 1/23 due to depression with suicidal ideations.  Patient has been treated with some improvement per Dr. Carlton Adam progressive note, but pt said "he might do something stupid like walk out in traffic". Per nurse report, patient was oriented x 3 yesterday, but was noted to be confused since last night. This morning at 8:10 he was assessed and noted to be very confused which is different from previous days. When I saw pt in the CT department, he is confused, he knows his own name.  He is not oriented to time and place.  He moves his right arm.  He has weakness in left arm and both legs.  He cannot provide detailed information.  Patient does not have active respiratory distress, cough, shortness breath, nausea, vomiting, diarrhea.  Does not seem to have chest pain or abdominal pain.  Not sure  if patient has symptoms of UTI." MRI brain "1. Technically limited exam due to the patient's inability to  tolerate the full length of the study and motion.  2. No acute intracranial infarct. No other definite intracranial  abnormality".   Assessment / Plan / Recommendation Clinical Impression  Pt seen for cognitive-linguistic evaluation. Pt alert and cooperative. Confabulation noted, "that window isn't real, right?" NT/Sitter at bedside. No family  present to establish baseline level of functioning. Per chart review, no documented hx of cognitive deficits. Noted pt is homeless.   Pt assessed via informal means and portions of Cognistat. Pt A&O to self and place, loosely to time (January 2024), and not to situation. Pt demonstrated intact basic auditory comprehension and verbal expression. Pt with cognitive-linguistic deficits affecting orientation and memory (immediate, short term, working). Pt took x5 trials to code x4 unrelated words. Pt scored 6/12 on Memory subtest of Cognistat. Pt scored 1/4 on Calculations subtest of Cognsitat which is mental calculations (working memory) task.   Pt endorsed cognitive-linguistic changes from baseline.   SLP to f/u for cognitive-linguistic tx, as appropriate. Based on today's assessment, anticipate need for frequent/constant supervision/assistance at d/c.   Pt and RN made aware of results, recommendations, and SLP POC. Pt verbalized understanding/agreement.    SLP Assessment  SLP Recommendation/Assessment: Patient needs continued Speech [facility] SLP Visit Diagnosis: Cognitive communication deficit (R41.841)    Recommendations for follow up therapy are one component of a multi-disciplinary discharge planning process, led by the attending physician.  Recommendations may be updated based on patient status, additional functional criteria and insurance authorization.    Follow Up Recommendations  Follow physician's recommendations for discharge plan and follow up therapies    Assistance Recommended at Discharge  Frequent or constant Supervision/Assistance  Functional Status Assessment Patient has had a recent decline in their functional status and demonstrates the ability to make significant improvements in function in a reasonable and predictable amount of time.  Frequency and Duration min 2x/week  2 weeks      SLP Evaluation Cognition  Overall Cognitive Status: No  family/caregiver present to determine baseline cognitive functioning Arousal/Alertness: Awake/alert Orientation Level: Oriented to person;Oriented to place;Disoriented to time;Disoriented to situation Year: 2024 Month: January Day of Week: Incorrect Memory: Impaired Memory Impairment: Storage deficit;Retrieval deficit;Decreased recall of new information Awareness: Impaired Problem Solving:  (verbal WFL) Executive Function: Reasoning Reasoning:  (verbal WFL) Behaviors: Confabulation Safety/Judgment:  (verbal WFL)       Comprehension  Auditory Comprehension Overall Auditory Comprehension: Appears within functional limits for tasks assessed Visual Recognition/Discrimination Discrimination: Not tested Reading Comprehension Reading Status: Not tested    Expression Expression Primary Mode of Expression: Verbal Verbal Expression Overall Verbal Expression: Appears within functional limits for tasks assessed Initiation: No impairment Repetition: No impairment Naming: No impairment Written Expression Dominant Hand: Right   Oral / Motor  Oral Motor/Sensory Function Overall Oral Motor/Sensory Function: Within functional limits Motor Speech Overall Motor Speech: Appears within functional limits for tasks assessed Respiration: Within functional limits Phonation: Normal Resonance: Within functional limits Articulation: Within functional limitis Intelligibility: Intelligible Motor Planning: Witnin functional limits Motor Speech Errors: Not applicable            Quintella Baton 09/23/2022, 9:46 AM

## 2022-09-23 NOTE — Consult Note (Signed)
Client is more upbeat on assessment than on initial presentation in the ED.  His anxiety is a 2/10 with 10 being the highest.  He reported minimal depression with no suicidal ideations.  When he came to the ED, the temperature was in the teens and living in a tent.  In the ED, he stated if he had shelter he would not be suicidal.  He does receive a check monthly and he, his son-in-law, and daughter share funds and live in hotels or tents together.  Reported feeling better with a plan to go to a hotel after discharge.  Based on his current state, he would not warrant re-admission to the psych unit.  This could change and he should be re-evaluated tomorrow when he is medically cleared to see if outpatient or inpatient is recommended.  Waylan Boga, PMHNP

## 2022-09-23 NOTE — Progress Notes (Cosign Needed)
Occupational Therapy * Physical Therapy * Speech Therapy  DATE 09/23/22 PATIENT NAME [name] PATIENT MRN [id number] DIAGNOSIS/DIAGNOSIS CODE G93.41 DATE OF DISCHARGE TBD  PRIMARY CARE PHYSICIAN Dr. Posey Pronto PCP PHONE/FAX [phone]    Dear Provider (Name: [place] Fax: [phone]):   I certify that I have examined this patient and that occupational/physical/speech therapy is necessary on an outpatient basis.    The patient has expressed interest in completing their recommended course of therapy at your location.  Once a formal order from the patient's primary care physician has been obtained, please contact him/her to schedule an appointment for evaluation at your earliest convenience.  [ x ]  Physical Therapy Evaluate and Treat  The patient's primary care physician (listed above) must furnish and be responsible for a formal order such that the recommended services may be furnished while under the primary physician's care, and that the plan of care will be established and reviewed every 30 days (or more often if condition necessitates).

## 2022-09-23 NOTE — Evaluation (Signed)
Physical Therapy Evaluation Patient Details Name: [name] MRN: [id number] DOB: 24-Mar-1951 Today's Date: 09/23/2022  History of Present Illness  Pt is a 72 y.o. male presenting to hospital 09/15/22 with suicidal ideations (plans to slit wrists); admitted to gero-psych 1/23.  Pt noted with AMS 1/27 and admitted to hospital with acute metabolic encephalopathy, possible hepatic encephalopathy, htn, chronic diastolic CHF, COPD, and MDD.  PMH includes asthma, CHF, COPD, DM, htn, sleep apnea, cardiac cath, eye sx, inner ear sx, nose sx, hepatitis C, liver lesion (suspected CA).  Clinical Impression  Prior to hospital admission, pt was modified independent ambulating with cane; lives in a tent (pt declined to state where though); no recent falls per pt report.  Currently oriented to person, place, month/year/day of week, and situation.  B UE and LE strength WFL and appearing symmetrical.  Currently pt is modified independent semi-supine to sitting edge of bed; CGA with transfers; and CGA (occasional min assist for balance d/t unsteadiness without UE support) ambulating 120 feet (20 feet with IV pole and 100 feet no UE support).  Pt declined to use walker but anticipate pt's balance would improve with use of cane.  Pt would benefit from skilled PT to address noted impairments and functional limitations (see below for any additional details).  Upon hospital discharge, pt would benefit from OP PT.    Recommendations for follow up therapy are one component of a multi-disciplinary discharge planning process, led by the attending physician.  Recommendations may be updated based on patient status, additional functional criteria and insurance authorization.  Follow Up Recommendations Outpatient PT      Assistance Recommended at Discharge PRN  Patient can return home with the following  A little help with walking and/or transfers;A little help with bathing/dressing/bathroom;Assistance with  cooking/housework;Assist for transportation    Equipment Recommendations Kasandra Knudsen (pt already owns [facility])  Recommendations for Other Services  OT consult    Functional Status Assessment Patient has had a recent decline in their functional status and demonstrates the ability to make significant improvements in function in a reasonable and predictable amount of time.     Precautions / Restrictions Precautions Precautions: Fall Precaution Comments: Seizure precautions Restrictions Weight Bearing Restrictions: No      Mobility  Bed Mobility Overal bed mobility: Modified Independent             General bed mobility comments: mild increased effort to perform on own semi-supine to sitting edge of bed with HOB elevated    Transfers Overall transfer level: Needs assistance Equipment used: None Transfers: Sit to/from Stand, Bed to chair/wheelchair/BSC Sit to Stand: Min guard   Step pivot transfers: Min guard (stand step turn bed to recliner)       General transfer comment: mild increased effort to stand but steady (x1 trial from bed and x1 trial from recliner)    Ambulation/Gait Ambulation/Gait assistance: Min guard, Min assist Gait Distance (Feet): 120 Feet Assistive device: IV Pole, None (x20 feet with single UE support on IV pole and x100 feet no UE support) Gait Pattern/deviations: Decreased step length - left, Decreased step length - right Gait velocity: decreased     General Gait Details: occasional mild unsteadiness requiring min assist for balance but mostly CGA; pt declined use of walker  Stairs            Wheelchair Mobility    Modified Rankin (Stroke Patients Only)       Balance Overall balance assessment: Needs assistance Sitting-balance support: No upper  extremity supported, Feet supported Sitting balance-Leahy Scale: Normal Sitting balance - Comments: steady sitting reaching outside BOS   Standing balance support: No upper extremity supported,  During functional activity Standing balance-Leahy Scale: Poor Standing balance comment: occasional min assist for balance during ambulation                             Pertinent Vitals/Pain Pain Assessment Pain Assessment: 0-10 Pain Score: 6  Pain Location: headache Pain Descriptors / Indicators: Headache Pain Intervention(s): Limited activity within patient's tolerance, Monitored during session, Other (comment) (nurse present and aware)    Home Living Family/patient expects to be discharged to:: Shelter/Homeless Living Arrangements: Alone                 Additional Comments: Pt reports living in a tent (but won't say where because he doesn't want anyone to know).    Prior Function Prior Level of Function : Independent/Modified Independent             Mobility Comments: Pt reports no recent falls.  Retired.  Uses SPC for ambulation typically (but has not been using since admitted to gero-psych).       Hand Dominance        Extremity/Trunk Assessment   Upper Extremity Assessment Upper Extremity Assessment: Overall WFL for tasks assessed;Defer to OT evaluation (at least 4/5 B elbow flexion/extension and shoulder flexion; good B hand grip strength)    Lower Extremity Assessment Lower Extremity Assessment: Generalized weakness (4+/5 B hip flexion, knee flexion/extension, and DF)    Cervical / Trunk Assessment Cervical / Trunk Assessment: Normal  Communication   Communication: No difficulties  Cognition Arousal/Alertness: Awake/alert Behavior During Therapy: WFL for tasks assessed/performed Overall Cognitive Status: Within Functional Limits for tasks assessed                                 General Comments: Oriented to person, place, situation, and month/year/day of week.        General Comments  Nursing cleared pt for participation in physical therapy.  Pt agreeable to PT session.    Exercises     Assessment/Plan    PT  Assessment Patient needs continued PT services  PT Problem List Decreased strength;Decreased activity tolerance;Decreased balance;Decreased mobility;Pain       PT Treatment Interventions DME instruction;Gait training;Functional mobility training;Therapeutic activities;Therapeutic exercise;Balance training;Patient/family education    PT Goals (Current goals can be found in the Care Plan section)  Acute Rehab PT Goals Patient Stated Goal: to go home PT Goal Formulation: With patient Time For Goal Achievement: 10/07/22 Potential to Achieve Goals: Good    Frequency Min 2X/week     Co-evaluation               AM-PAC PT "6 Clicks" Mobility  Outcome Measure Help needed turning from your back to your side while in a flat bed without using bedrails?: None Help needed moving from lying on your back to sitting on the side of a flat bed without using bedrails?: None Help needed moving to and from a bed to a chair (including a wheelchair)?: A Little Help needed standing up from a chair using your arms (e.g., wheelchair or bedside chair)?: A Little Help needed to walk in hospital room?: A Little Help needed climbing 3-5 steps with a railing? : A Little 6 Click Score: 20    End of  Session Equipment Utilized During Treatment: Gait belt Activity Tolerance: Patient tolerated treatment well Patient left:  (with OT for OT evaluation) Nurse Communication: Mobility status;Precautions PT Visit Diagnosis: Unsteadiness on feet (R26.81);Muscle weakness (generalized) (M62.81)    Time: 2257-5051 PT Time Calculation (min) (ACUTE ONLY): 19 min   Charges:   PT Evaluation $PT Eval Low Complexity: 1 Low         Paulette [place]ford, PT 09/23/22, 9:40 AM

## 2022-09-23 NOTE — Progress Notes (Signed)
PROGRESS NOTE    [name]  QPR:[id number]  DOB: 1950-12-15  DOA: 09/22/2022 PCP: Denton Lank, MD Outpatient Specialists:   Hospital course:  72 year old man was transferred from [facility] where he had been admitted for depression due to altered mental status.  Initially concern was for a stroke however MRI was negative, thought to be likely hepatic encephalopathy given initial ammonia of 67.  Patient started on lactulose.   Subjective:  Patient is awake and alert today.  He is not confused, knows where he is, is in good spirits.   Objective: Vitals:   09/23/22 0007 09/23/22 0330 09/23/22 1110  BP: (!) 140/59 (!) 169/68 (!) 158/74  Pulse: (!) 57 (!) 55   Resp: 19 19   Temp: 98.6 F (37 C) 98.6 F (37 C) 97.9 F (36.6 C)  TempSrc:   Oral  SpO2: 98% 98% 97%    Intake/Output Summary (Last 24 hours) at 09/23/2022 1220 Last data filed at 09/23/2022 9935 Gross per 24 hour  Intake 23.13 ml  Output 1502 ml  Net -1478.87 ml   There were no vitals filed for this visit.   Exam:  General: Relatively well-appearing patient sitting up in bed chatting with nurses and physical therapist. Eyes: sclera anicteric, conjuctiva mild injection bilaterally CVS: S1-S2, regular  Respiratory:  decreased air entry bilaterally secondary to decreased inspiratory effort, rales at bases  GI: NABS, soft, NT  LE: Warm and well-perfused Neuro: A/O x 3,  grossly nonfocal, able to move arms and legs without difficulty. Psych: patient is logical and coherent, judgement and insight appear normal, mood and affect appropriate to situation.  Data Reviewed:  Basic Metabolic Panel: Recent Labs  Lab 09/22/22 1420  NA 140  K 3.6  CL 110  CO2 21*  GLUCOSE 83  BUN 19  CREATININE 0.87  CALCIUM 9.2    CBC: Recent Labs  Lab 09/22/22 1616  WBC 2.5*  NEUTROABS 1.2*  HGB 11.4*  HCT 33.6*  MCV 93.6  PLT 80*     Scheduled Meds:  aspirin  300 mg Rectal Daily   Or   aspirin  325 mg  Oral Daily   atorvastatin  40 mg Oral Daily   enoxaparin (LOVENOX) injection  0.5 mg/kg Subcutaneous Q24H   insulin aspart  0-5 Units Subcutaneous QHS   insulin aspart  0-9 Units Subcutaneous TID WC   lactulose  30 g Oral BID   pantoprazole  20 mg Oral Daily   Continuous Infusions:  sodium chloride 75 mL/hr at 09/22/22 1440     Assessment & Plan:   Acute metabolic encephalopathy secondary to hepatic encephalopathy Patient is back to baseline after initiation of lactulose MRI is negative for acute CVA Patient can be discharged or be transferred back to [facility] per psychiatric evaluation  MDD  with suicidal ideation Inpatient consult to psychiatry and secure chat to Gust Rung entered for their plans for psychiatric disposition. Will restart Seroquel and Lexapro for [facility] regimen since his stroke has been ruled out  HTN Will restart lisinopril Continue to hold Lasix as dehydration may have been contributory to the hepatic encephalopathy  Decreased WBC Subacute, dates back to June 2023 Unclear if any of his meds may be contributing to this May need outpatient hematology workup   DM 2 Blood sugars under reasonable control on as needed insulin Patient takes Jardiance and metformin at home, recent A1c is 4.9  COPD No evidence for acute flare PRN  Inhaled bronchodilators    DVT  prophylaxis: Lovenox Code Status: Full Family Communication: None today Disposition Plan:   Patient is from: [facility]  Anticipated Discharge Location: Per [facility], consult pending   Studies: MR BRAIN WO CONTRAST  Result Date: 09/23/2022 CLINICAL DATA:  Follow-up examination for stroke. EXAM: MRI HEAD WITHOUT CONTRAST TECHNIQUE: Multiplanar, multiecho pulse sequences of the brain and surrounding structures were obtained without intravenous contrast. COMPARISON:  Prior CT from earlier the same day. FINDINGS: Brain: Examination technically limited as the patient was unable to tolerate the full length of the exam.  Additionally, provided images are degraded by motion. Diffusion-weighted sequences demonstrate no evidence for acute or subacute ischemia. Underlying cerebral volume grossly within normal limits. No visible areas of chronic cortical infarction. No mass lesion, mass effect, or midline shift. Ventricles grossly within normal limits for size without hydrocephalus. No extra-axial fluid collection. Vascular: Major intracranial vascular flow voids are grossly maintained at the skull base. Skull and upper cervical spine: Bone marrow signal intensity grossly normal. No scalp soft tissue abnormality. Sinuses/Orbits: Globes orbital soft tissues grossly within normal limits. Paranasal sinuses are grossly clear. No mastoid effusion. Other: None. IMPRESSION: 1. Technically limited exam due to the patient's inability to tolerate the full length of the study and motion. 2. No acute intracranial infarct. No other definite intracranial abnormality. Electronically Signed   By: Jeannine Boga M.D.   On: 09/23/2022 03:19   CT ANGIO HEAD NECK W WO CM W PERF (CODE STROKE)  Result Date: 09/22/2022 CLINICAL DATA:  72 year old male code stroke presentation this morning. EXAM: CT ANGIOGRAPHY HEAD AND NECK CT PERFUSION BRAIN TECHNIQUE: Multidetector CT imaging of the head and neck was performed using the standard protocol during bolus administration of intravenous contrast. Multiplanar CT image reconstructions and MIPs were obtained to evaluate the vascular anatomy. Carotid stenosis measurements (when applicable) are obtained utilizing NASCET criteria, using the distal internal carotid diameter as the denominator. Multiphase CT imaging of the brain was performed following IV bolus contrast injection. Subsequent parametric perfusion maps were calculated using RAPID software. RADIATION DOSE REDUCTION: This exam was performed according to the departmental dose-optimization program which includes automated exposure control, adjustment  of the mA and/or kV according to patient size and/or use of iterative reconstruction technique. CONTRAST:  133m OMNIPAQUE IOHEXOL 350 MG/ML SOLN COMPARISON:  Plain head CT 0845 hours today. Brain MRI 04/28/2022. FINDINGS: CT Brain Perfusion Findings: ASPECTS: 10 CBF (<30%) Volume: 0 mL Perfusion (Tmax>6.0s) volume: 178m although mostly in a quasi- symmetric pattern of the inferior frontal gyri which is suspicious for artifact. Mismatch Volume: Likely zero Infarction Location:No infarct core detected. CTA NECK Skeleton: Absent dentition. Cervical spine disc and endplate degeneration. Some levels of acquired degenerative interbody ankylosis including in the upper thoracic spine. No acute osseous abnormality identified. Upper chest: Central pulmonary artery enlargement on series 4, image 170, visible pulmonary arteries appear to be normally enhancing. Negative upper lungs. No superior mediastinal lymphadenopathy. Other neck: Negative. Aortic arch: Mild Calcified aortic atherosclerosis. 3 vessel arch configuration. Right carotid system: Mildly tortuous brachiocephalic artery and tortuous proximal right CCA with no plaque or stenosis. Negative right carotid bifurcation. Tortuous right ICA with no stenosis. Left carotid system: Similar tortuosity with no significant plaque or stenosis. Vertebral arteries: Mildly tortuous proximal right subclavian artery with no plaque or stenosis. Normal right vertebral artery origin. Patent right vertebral artery to the skull base with mild tortuosity, no plaque or stenosis. Normal proximal left subclavian artery and left vertebral artery origin. Codominant left vertebral artery with mild tortuosity  is patent to the skull base. No significant plaque or stenosis. CTA HEAD Early intracranial contrast timing. Posterior circulation: Distal vertebral arteries, vertebrobasilar junction, and both PICA origins appear patent without stenosis. Patent basilar artery without stenosis. Patent SCA  and PCA origins, fetal type bilateral PCAs. Bilateral PCA branches are within normal limits. Anterior circulation: Both ICA siphons are patent. Mild left siphon cavernous and anterior genu calcified plaque without stenosis. Similar mild right siphon calcified plaque without significant stenosis. Both posterior communicating artery origins appear normal. Patent carotid termini, MCA and ACA origins. Anterior communicating artery and bilateral ACA branches are within normal limits. Left MCA M1 segment and bifurcation are patent without stenosis. Right MCA M1 segment and bifurcation are patent without stenosis. Visible bilateral MCA branches appear relatively symmetric and within normal limits. Venous sinuses: Early contrast timing, not evaluated. Anatomic variants: Fetal type bilateral PCA origins. Review of the MIP images confirms the above findings IMPRESSION: 1. CTA is Negative for large vessel occlusion, with suboptimal intracranial contrast timing. 2. No infarct core detected by CT Perfusion, and likely artifactual small volume of T-max parameter abnormality (mostly at the anterior inferior frontal lobes). 3. Mild for age atherosclerosis in the head and neck. No significant arterial stenosis identified. 4. 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 Electronically Signed   By: Genevie Ann M.D.   On: 09/22/2022 09:58   CT HEAD CODE STROKE WO CONTRAST`  Result Date: 09/22/2022 CLINICAL DATA:  Code stroke.  72 year old male EXAM: CT HEAD WITHOUT CONTRAST TECHNIQUE: Contiguous axial images were obtained from the base of the skull through the vertex without intravenous contrast. RADIATION DOSE REDUCTION: This exam was performed according to the departmental dose-optimization program which includes automated exposure control, adjustment of the mA and/or kV according to patient size and/or use of iterative reconstruction technique. COMPARISON:  Head CT 08/31/2022, brain MRI  04/28/2022. FINDINGS: Brain: Cerebral volume remains normal for age. No midline shift, ventriculomegaly, mass effect, evidence of mass lesion, intracranial hemorrhage or evidence of cortically based acute infarction. Gray-white differentiation appears stable and within normal limits for age throughout. Vascular: Mild Calcified atherosclerosis at the skull base. Stable. No suspicious intracranial vascular hyperdensity. Skull: No acute osseous abnormality identified. Sinuses/Orbits: Visualized paranasal sinuses and mastoids are stable and well aerated. Other: Mild rightward gaze.  Stable scalp soft tissues. ASPECTS [name] Stroke Program Early CT Score) Total score (0-10 with 10 being normal): 10 IMPRESSION: 1. Stable and normal for age noncontrast CT appearance of the brain. ASPECTS 10. 2. These results were communicated to Dr. Leonel Ramsay at 9:07 am on 09/22/2022 by text page via the [facility] messaging system. Electronically Signed   By: Genevie Ann M.D.   On: 09/22/2022 09:07    Principal Problem:   Acute metabolic encephalopathy Active Problems:   Acute hepatic encephalopathy (HCC)   HTN (hypertension)   Chronic diastolic CHF (congestive heart failure) (HCC)   COPD (chronic obstructive pulmonary disease) (HCC)   MDD (major depressive disorder)   HLD (hyperlipidemia)   Diabetes mellitus without complication (HCC)   Thrombocytopenia (HCC)   Liver lesion   Liver cirrhosis (HCC)   Unsheltered homelessness   Obesity (BMI 30-39.9)     [name] Derek Jack, [facility]  If 7PM-7AM, please contact night-coverage [email or web address]   LOS: 1 day

## 2022-09-23 NOTE — Evaluation (Signed)
Occupational Therapy Evaluation Patient Details Name: [name] MRN: [id number] DOB: [birth year] Today's Date: 09/23/2022   History of Present Illness Pt is a 72 year old male initially admitted to behavioral health on 1/23 due to depression with suicidal ideations, then transferred to medical service due to possible hepatic encephalopathy; PMH significant for HTN, CHF, MDD, HLD, DM, throbocytopenia, liver cirrhosis   Clinical Impression   Chart reviewed, pt greeted in room finishing with PT evaluation. Pt is alert, oriented to self, place, grossly to situation; not oriented to date. Increased time required for processing with impaired STM, multi step direction following, safety awareness noted. Will continue to assess. PTA pt reports MOD I-I in ADL/IADL, still living in a tent with step daughter/step daughter boyfriend close by. Pt presents with deficits in strength, endurance, activity tolerance, cognition affecting safe and optimal ADL completion. Functional mobility completed with CGA, toilet transfer with CGA, grooming standing at sink with supervision. LB bathing completed with supervision. At this time recommend HHOT to address functional deficits, OT will continue to follow acutely.      Recommendations for follow up therapy are one component of a multi-disciplinary discharge planning process, led by the attending physician.  Recommendations may be updated based on patient status, additional functional criteria and insurance authorization.   Follow Up Recommendations  Home health OT     Assistance Recommended at Discharge Frequent or constant Supervision/Assistance  Patient can return home with the following Direct supervision/assist for medications management;Assistance with cooking/housework    Functional Status Assessment  Patient has had a recent decline in their functional status and demonstrates the ability to make significant improvements in function in a reasonable and  predictable amount of time.  Equipment Recommendations  None recommended by OT    Recommendations for Other Services       Precautions / Restrictions Precautions Precautions: Fall Precaution Comments: Seizure precautions Restrictions Weight Bearing Restrictions: No      Mobility Bed Mobility               General bed mobility comments: Nt with PT prior to session, in chair with sitter present after session    Transfers Overall transfer level: Needs assistance Equipment used: None Transfers: Sit to/from Stand Sit to Stand: Supervision, Min guard           General transfer comment: multiple attempts      Balance Overall balance assessment: Needs assistance Sitting-balance support: No upper extremity supported, Feet supported Sitting balance-Leahy Scale: Normal     Standing balance support: No upper extremity supported, During functional activity Standing balance-Leahy Scale: Fair                             ADL either performed or assessed with clinical judgement   ADL Overall ADL's : Needs assistance/impaired Eating/Feeding: Set up;Sitting   Grooming: Wash/dry hands;Standing;Supervision/safety Grooming Details (indicate cue type and reason): sink level     Lower Body Bathing: Supervison/ safety;Sit to/from stand       Lower Body Dressing: Supervision/safety   Toilet Transfer: Min guard;Ambulation Toilet Transfer Details (indicate cue type and reason): holding onto IV pole for support Toileting- Clothing Manipulation and Hygiene: Supervision/safety;Min guard;Sit to/from stand       Functional mobility during ADLs: Supervision/safety;Min guard (household distances in room)       Vision Baseline Vision/History: 1 Wears glasses Patient Visual Report: No change from baseline       Perception  Praxis      Pertinent Vitals/Pain Pain Assessment Pain Assessment: 0-10 Pain Score: 6  Pain Location: headache Pain Descriptors /  Indicators: Headache Pain Intervention(s): Limited activity within patient's tolerance, Monitored during session     Hand Dominance Right   Extremity/Trunk Assessment Upper Extremity Assessment Upper Extremity Assessment: Overall WFL for tasks assessed   Lower Extremity Assessment Lower Extremity Assessment: Generalized weakness   Cervical / Trunk Assessment Cervical / Trunk Assessment: Normal   Communication Communication Communication: No difficulties   Cognition Arousal/Alertness: Awake/alert Behavior During Therapy: WFL for tasks assessed/performed Overall Cognitive Status: No family/caregiver present to determine baseline cognitive functioning Area of Impairment: Orientation, Attention, Memory, Following commands, Safety/judgement, Awareness, Problem solving                 Orientation Level: Disoriented to, Time (situation- "I was having post traumatic stress") Current Attention Level: Sustained Memory: Decreased short-term memory Following Commands: Follows one step commands with increased time, Follows multi-step commands inconsistently Safety/Judgement: Decreased awareness of deficits Awareness: Emergent Problem Solving: Requires verbal cues, Requires tactile cues, Slow processing, Decreased initiation       General Comments  vital signs monitored, appear stable throughout    Exercises Other Exercises Other Exercises: edu pt re: role of OT, role of rehab, discharge recommendations, safe ADL completion   Shoulder Instructions      Home Living Family/patient expects to be discharged to:: Shelter/Homeless Living Arrangements: Alone   Type of Home: Homeless                           Additional Comments: Pt lives in a tent with step daughter/ her boyfriend nearby who he says can help but also reports he gets paid on the 2nd and plans to move into a room/somewhere warm however pt report he dosent have a plan and "will figure it out"      Prior  Functioning/Environment Prior Level of Function : Independent/Modified Independent             Mobility Comments: Amb with spc ADLs Comments: MOD I with ADL/IADL        OT Problem List: Decreased strength;Decreased activity tolerance;Impaired balance (sitting and/or standing);Decreased cognition;Decreased safety awareness      OT Treatment/Interventions: Self-care/ADL training;Patient/family education;Therapeutic exercise;Balance training;DME and/or AE instruction;Therapeutic activities;Energy conservation;Cognitive remediation/compensation    OT Goals(Current goals can be found in the care plan section) Acute Rehab OT Goals Patient Stated Goal: stay at hospital until he can rent a room OT Goal Formulation: With patient Time For Goal Achievement: 10/07/22 Potential to Achieve Goals: Fair ADL Goals Pt Will Perform Grooming: with modified independence;standing;sitting Pt Will Perform Lower Body Dressing: with modified independence Pt Will Transfer to Toilet: with modified independence;ambulating Pt Will Perform Toileting - Clothing Manipulation and hygiene: with modified independence;sit to/from stand  OT Frequency: Min 2X/week    Co-evaluation              AM-PAC OT "6 Clicks" Daily Activity     Outcome Measure Help from another person eating meals?: None Help from another person taking care of personal grooming?: None Help from another person toileting, which includes using toliet, bedpan, or urinal?: None Help from another person bathing (including washing, rinsing, drying)?: A Little Help from another person to put on and taking off regular upper body clothing?: None Help from another person to put on and taking off regular lower body clothing?: None 6 Click Score: 23  End of Session Nurse Communication: Mobility status  Activity Tolerance: Patient tolerated treatment well Patient left: in chair;with call bell/phone within reach (1:1 present)  OT Visit  Diagnosis: Unsteadiness on feet (R26.81);Muscle weakness (generalized) (M62.81)                Time: 0158-6825 OT Time Calculation (min): 9 min Charges:  OT General Charges $OT Visit: 1 Visit OT Evaluation $OT Eval Low Complexity: 1 Low  Shanon Payor, OTD OTR/L  09/23/22, 10:19 AM

## 2022-09-24 ENCOUNTER — Encounter: Payer: Self-pay | Admitting: Internal Medicine

## 2022-09-24 ENCOUNTER — Other Ambulatory Visit: Payer: Self-pay

## 2022-09-24 ENCOUNTER — Inpatient Hospital Stay
Admission: AD | Admit: 2022-09-24 | Discharge: 2022-10-04 | DRG: 885 | Disposition: A | Discharge status: Other Acute Inpatient Hospital | Payer: 59 | Source: Intra-hospital | Attending: Psychiatry | Admitting: Psychiatry

## 2022-09-24 ENCOUNTER — Encounter: Payer: Self-pay | Admitting: Psychiatry

## 2022-09-24 DIAGNOSIS — Z79899 Other long term (current) drug therapy: Secondary | ICD-10-CM

## 2022-09-24 DIAGNOSIS — K219 Gastro-esophageal reflux disease without esophagitis: Secondary | ICD-10-CM | POA: Diagnosis present

## 2022-09-24 DIAGNOSIS — I110 Hypertensive heart disease with heart failure: Secondary | ICD-10-CM | POA: Diagnosis present

## 2022-09-24 DIAGNOSIS — Z87891 Personal history of nicotine dependence: Secondary | ICD-10-CM | POA: Diagnosis not present

## 2022-09-24 DIAGNOSIS — F329 Major depressive disorder, single episode, unspecified: Principal | ICD-10-CM | POA: Diagnosis present

## 2022-09-24 DIAGNOSIS — G9341 Metabolic encephalopathy: Secondary | ICD-10-CM | POA: Diagnosis not present

## 2022-09-24 DIAGNOSIS — K7682 Hepatic encephalopathy: Secondary | ICD-10-CM | POA: Diagnosis not present

## 2022-09-24 DIAGNOSIS — F4310 Post-traumatic stress disorder, unspecified: Secondary | ICD-10-CM | POA: Diagnosis present

## 2022-09-24 DIAGNOSIS — Z8249 Family history of ischemic heart disease and other diseases of the circulatory system: Secondary | ICD-10-CM

## 2022-09-24 DIAGNOSIS — R001 Bradycardia, unspecified: Secondary | ICD-10-CM | POA: Diagnosis not present

## 2022-09-24 DIAGNOSIS — Z5902 Unsheltered homelessness: Secondary | ICD-10-CM

## 2022-09-24 DIAGNOSIS — E119 Type 2 diabetes mellitus without complications: Secondary | ICD-10-CM | POA: Diagnosis present

## 2022-09-24 DIAGNOSIS — J4489 Other specified chronic obstructive pulmonary disease: Secondary | ICD-10-CM | POA: Diagnosis present

## 2022-09-24 DIAGNOSIS — H547 Unspecified visual loss: Secondary | ICD-10-CM | POA: Diagnosis present

## 2022-09-24 DIAGNOSIS — R45851 Suicidal ideations: Secondary | ICD-10-CM | POA: Diagnosis present

## 2022-09-24 DIAGNOSIS — Z8616 Personal history of COVID-19: Secondary | ICD-10-CM

## 2022-09-24 DIAGNOSIS — H9190 Unspecified hearing loss, unspecified ear: Secondary | ICD-10-CM | POA: Diagnosis present

## 2022-09-24 DIAGNOSIS — Z7984 Long term (current) use of oral hypoglycemic drugs: Secondary | ICD-10-CM | POA: Diagnosis not present

## 2022-09-24 DIAGNOSIS — G4700 Insomnia, unspecified: Secondary | ICD-10-CM | POA: Diagnosis present

## 2022-09-24 DIAGNOSIS — F332 Major depressive disorder, 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 psychotic features: Secondary | ICD-10-CM | POA: Diagnosis present

## 2022-09-24 DIAGNOSIS — K7460 Unspecified cirrhosis of liver: Secondary | ICD-10-CM | POA: Diagnosis not present

## 2022-09-24 LAB — GLUCOSE, CAPILLARY
Glucose-Capillary: 116 mg/dL — ABNORMAL HIGH (ref 70–99)
Glucose-Capillary: 64 mg/dL — ABNORMAL LOW (ref 70–99)
Glucose-Capillary: 100 mg/dL — ABNORMAL HIGH (ref 70–99)
Glucose-Capillary: 89 mg/dL (ref 70–99)
Glucose-Capillary: 147 mg/dL — ABNORMAL HIGH (ref 70–99)

## 2022-09-24 MED ORDER — SENNOSIDES-DOCUSATE SODIUM 8.6-50 MG PO TABS: 1.0000 | ORAL_TABLET | Freq: Every evening | ORAL | Status: DC | PRN

## 2022-09-24 MED ORDER — ONDANSETRON HCL 4 MG/2ML IJ SOLN: 4.0000 mg | Freq: Three times a day (TID) | INTRAMUSCULAR | Status: DC | PRN

## 2022-09-24 MED ORDER — HYDROXYZINE HCL 10 MG PO TABS
10.0000 mg | ORAL_TABLET | Freq: Three times a day (TID) | ORAL | Status: DC | PRN
  Administered 2022-09-25 – 2022-10-02 (×5): 10 mg via ORAL
  Filled 2022-09-24 (×5): qty 1

## 2022-09-24 MED ORDER — LACTULOSE 10 GM/15ML PO SOLN
30.0000 g | Freq: Two times a day (BID) | ORAL | Status: DC
  Administered 2022-09-24 – 2022-10-01 (×14): 30 g via ORAL
  Filled 2022-09-24 (×16): qty 60

## 2022-09-24 MED ORDER — QUETIAPINE FUMARATE 25 MG PO TABS
50.0000 mg | ORAL_TABLET | Freq: Every day | ORAL | Status: DC
  Administered 2022-09-24 – 2022-10-03 (×10): 50 mg via ORAL
  Filled 2022-09-24 (×10): qty 2

## 2022-09-24 MED ORDER — ACETAMINOPHEN 160 MG/5ML PO SOLN: 650.0000 mg | ORAL | Status: DC | PRN

## 2022-09-24 MED ORDER — ASPIRIN 325 MG PO TABS
325.0000 mg | ORAL_TABLET | Freq: Every day | ORAL | Status: DC
  Administered 2022-09-25 – 2022-10-04 (×10): 325 mg via ORAL
  Filled 2022-09-24 (×10): qty 1

## 2022-09-24 MED ORDER — PANTOPRAZOLE SODIUM 20 MG PO TBEC
20.0000 mg | DELAYED_RELEASE_TABLET | Freq: Every day | ORAL | Status: DC
  Administered 2022-09-25 – 2022-10-03 (×9): 20 mg via ORAL
  Filled 2022-09-24 (×10): qty 1

## 2022-09-24 MED ORDER — ALBUTEROL SULFATE (2.5 MG/3ML) 0.083% IN NEBU: 2.5000 mg | INHALATION_SOLUTION | RESPIRATORY_TRACT | Status: DC | PRN

## 2022-09-24 MED ORDER — ENOXAPARIN SODIUM 60 MG/0.6ML IJ SOSY
0.5000 mg/kg | PREFILLED_SYRINGE | INTRAMUSCULAR | Status: DC
  Administered 2022-09-24 – 2022-10-03 (×10): 55 mg via SUBCUTANEOUS
  Filled 2022-09-24 (×12): qty 0.6

## 2022-09-24 MED ORDER — CITALOPRAM HYDROBROMIDE 20 MG PO TABS
20.0000 mg | ORAL_TABLET | Freq: Every day | ORAL | Status: DC
  Administered 2022-09-25 – 2022-10-04 (×10): 20 mg via ORAL
  Filled 2022-09-24 (×10): qty 1

## 2022-09-24 MED ORDER — SALINE SPRAY 0.65 % NA SOLN
1.0000 | NASAL | Status: DC | PRN
  Administered 2022-09-29 – 2022-09-30 (×2): 1 via NASAL
  Filled 2022-09-24: qty 44

## 2022-09-24 MED ORDER — ACETAMINOPHEN 650 MG RE SUPP: 650.0000 mg | RECTAL | Status: DC | PRN

## 2022-09-24 MED ORDER — ASPIRIN 300 MG RE SUPP
300.0000 mg | Freq: Every day | RECTAL | Status: DC
  Filled 2022-09-24 (×10): qty 1

## 2022-09-24 MED ORDER — NITROGLYCERIN 0.4 MG SL SUBL: 0.4000 mg | SUBLINGUAL_TABLET | SUBLINGUAL | Status: DC | PRN

## 2022-09-24 MED ORDER — HYDRALAZINE HCL 20 MG/ML IJ SOLN: 5.0000 mg | INTRAMUSCULAR | Status: DC | PRN

## 2022-09-24 MED ORDER — LISINOPRIL 20 MG PO TABS
20.0000 mg | ORAL_TABLET | Freq: Every day | ORAL | Status: DC
  Administered 2022-09-25: 20 mg via ORAL
  Filled 2022-09-24: qty 1

## 2022-09-24 MED ORDER — DM-GUAIFENESIN ER 30-600 MG PO TB12
1.0000 | ORAL_TABLET | Freq: Two times a day (BID) | ORAL | Status: DC | PRN
  Administered 2022-09-28 – 2022-09-30 (×2): 1 via ORAL
  Filled 2022-09-24 (×2): qty 1

## 2022-09-24 MED ORDER — ACETAMINOPHEN 325 MG PO TABS
650.0000 mg | ORAL_TABLET | ORAL | Status: DC | PRN
  Administered 2022-09-28 – 2022-10-03 (×2): 650 mg via ORAL
  Filled 2022-09-24 (×2): qty 2

## 2022-09-24 MED ORDER — LACTULOSE 10 GM/15ML PO SOLN: 30.0000 g | Freq: Two times a day (BID) | ORAL | 0 refills | Status: DC

## 2022-09-24 MED ORDER — ATORVASTATIN CALCIUM 10 MG PO TABS
40.0000 mg | ORAL_TABLET | Freq: Every day | ORAL | Status: DC
  Administered 2022-09-25 – 2022-10-04 (×10): 40 mg via ORAL
  Filled 2022-09-24 (×10): qty 4

## 2022-09-24 NOTE — Progress Notes (Signed)
Jaimere's daughter's number is [phone]

## 2022-09-24 NOTE — Progress Notes (Addendum)
Speech Language Pathology Treatment:    Patient Details Name: [name] MRN: [id number] DOB: 03-07-1951 Today's Date: 09/24/2022 Time: 7408-1448 SLP Time Calculation (min) (ACUTE ONLY): 15 min  Assessment / Plan / Recommendation Clinical Impression  Pt seen for speech/language tx. Pt received seated upright at EOB consuming lunch.   Pt with marked improvement in cognitive-linguistic ability. Pt A&Ox4 with indep use of environmental aides. Short term memory targeted. Pt coded and recalled 4/4 unrelated words after 10 minutes with min cueing. Reviewed functional recall strategies with pt for use at d/c (e.g. use of cell phone, written notes, reminders). Pt identified uses for functional recall strategies at d/c independently.   Suspect pt is at or near cognitive-linguistic baseline. SLP to sign off as pt has no acute SLP needs.  Pt made aware of results, recommendations, and SLP POC. Pt verbalized understanding/agreement.   HPI HPI: Per H&P "[name] is a 72 y.o. male with medical history significant of homeless, depression, anxiety, hypertension, hyperlipidemia, diabetes mellitus, COPD, diastolic CHF, TIA, migraine headache, hepatitis C, liver cirrhosis, liver lesion (suspecting cancer), who presents with altered mental status.     Patient was initially admitted to behavioral health on 1/23 due to depression with suicidal ideations.  Patient has been treated with some improvement per Dr. Carlton Adam progressive note, but pt said "he might do something stupid like walk out in traffic". Per nurse report, patient was oriented x 3 yesterday, but was noted to be confused since last night. This morning at 8:10 he was assessed and noted to be very confused which is different from previous days. When I saw pt in the CT department, he is confused, he knows his own name.  He is not oriented to time and place.  He moves his right arm.  He has weakness in left arm and both legs.  He cannot provide  detailed information.  Patient does not have active respiratory distress, cough, shortness breath, nausea, vomiting, diarrhea.  Does not seem to have chest pain or abdominal pain.  Not sure if patient has symptoms of UTI." MRI brain "1. Technically limited exam due to the patient's inability to  tolerate the full length of the study and motion.  2. No acute intracranial infarct. No other definite intracranial  abnormality".      SLP Plan  All goals met      Recommendations for follow up therapy are one component of a multi-disciplinary discharge planning process, led by the attending physician.  Recommendations may be updated based on patient status, additional functional criteria and insurance authorization.    Recommendations                   Follow Up Recommendations: Follow physician's recommendations for discharge plan and follow up therapies Assistance recommended at discharge: Frequent or constant Supervision/Assistance SLP Visit Diagnosis: Cognitive communication deficit (J85.631) Plan: All goals met          Cherrie Gauze, M.S., [facility] [phone] [place])  Quintella Baton  09/24/2022, 1:43 PM

## 2022-09-24 NOTE — Progress Notes (Signed)
Occupational Therapy Treatment Patient Details Name: [name] MRN: [id number] DOB: September 14, 1950 Today's Date: 09/24/2022   History of present illness Pt is a 72 year old male initially admitted to behavioral health on 1/23 due to depression with suicidal ideations, then transferred to medical service due to possible hepatic encephalopathy; PMH significant for HTN, CHF, MDD, HLD, DM, throbocytopenia, liver cirrhosis   OT comments  [name] demonstrated improvement from earlier rehab sessions. He was able to perform bed mobility and transfers [facility], grooming in standing, toileting, and ambulation in room and hallways without any LOB, no AD, all with Mod I-SUPV. He reports he is no longer feeling suicidal and anticipates he will be safe returning to his tent now that it is not so cold out. He intends to purchase a new sleeping bag when he receives his next social security check and also to stay in a hotel for several days at least. He is A&O X 4, pleasant and talkative. Will continue to keep on OT schedule while he is hospitalized, but do not anticipate that he will need OT services post-DC. Discharge recommendations updated accordingly. If he does need OT following discharge, would have to be on an outpatient basis rather that [place].   Recommendations for follow up therapy are one component of a multi-disciplinary discharge planning process, led by the attending physician.  Recommendations may be updated based on patient status, additional functional criteria and insurance authorization.    Follow Up Recommendations  No OT follow up     Assistance Recommended at Discharge PRN  Patient can return home with the following      Equipment Recommendations  None recommended by OT    Recommendations for Other Services      Precautions / Restrictions Precautions Precautions: Fall Restrictions Weight Bearing Restrictions: No       Mobility Bed Mobility Overal bed mobility: Modified  Independent                  Transfers Overall transfer level: Modified independent Equipment used: None Transfers: Sit to/from Stand Sit to Stand: Modified independent (Device/Increase time)           General transfer comment: able to move easily and smoothly sit<>stand     Balance Overall balance assessment: Needs assistance   Sitting balance-Leahy Scale: Normal     Standing balance support: No upper extremity supported, During functional activity Standing balance-Leahy Scale: Good Standing balance comment: able to ambulate easily in room and around nurses' station, no AD, no LOB                           ADL either performed or assessed with clinical judgement   ADL Overall ADL's : Needs assistance/impaired Eating/Feeding: Modified independent   Grooming: Wash/dry hands;Wash/dry face;Standing;Supervision/safety           Upper Body Dressing : Modified independent Upper Body Dressing Details (indicate cue type and reason): donning gown                 Functional mobility during ADLs: Supervision/safety      Extremity/Trunk Assessment Upper Extremity Assessment Upper Extremity Assessment: Overall WFL for tasks assessed   Lower Extremity Assessment Lower Extremity Assessment: Overall WFL for tasks assessed   Cervical / Trunk Assessment Cervical / Trunk Assessment: Normal    Vision       Perception     Praxis      Cognition Arousal/Alertness: Awake/alert Behavior During  Therapy: WFL for tasks assessed/performed Overall Cognitive Status: Within Functional Limits for tasks assessed                                          Exercises Other Exercises Other Exercises: educ re: medication, DM mgmt while homeless; mental health concerns; safety post DC    Shoulder Instructions       General Comments      Pertinent Vitals/ Pain       Pain Assessment Pain Assessment: No/denies pain  Home Living                                           Prior Functioning/Environment              Frequency  Min 2X/week        Progress Toward Goals  OT Goals(current goals can now be found in the care plan section)  Progress towards OT goals: Progressing toward goals  Acute Rehab OT Goals OT Goal Formulation: With patient Time For Goal Achievement: 10/07/22 Potential to Achieve Goals: Good  Plan Discharge plan needs to be updated;Frequency remains appropriate    Co-evaluation                 AM-PAC OT "6 Clicks" Daily Activity     Outcome Measure   Help from another person eating meals?: None Help from another person taking care of personal grooming?: None Help from another person toileting, which includes using toliet, bedpan, or urinal?: None Help from another person bathing (including washing, rinsing, drying)?: A Little Help from another person to put on and taking off regular upper body clothing?: None Help from another person to put on and taking off regular lower body clothing?: None 6 Click Score: 23    End of Session        Activity Tolerance Patient tolerated treatment well   Patient Left in bed;with call bell/phone within reach;with bed alarm set   Nurse Communication          Time: 7793-9030 OT Time Calculation (min): 32 min  Charges: OT General Charges $OT Visit: 1 Visit OT Treatments $Self Care/Home Management : 23-37 mins Drew Lobo, PhD, MS, OTR/L 09/24/22, 1:35 PM

## 2022-09-24 NOTE — Discharge Summary (Signed)
Physician Discharge Summary   Patient: [name] MRN: [id number] DOB: [birth year]  Admit date:     09/22/2022  Discharge date: 09/24/22  Discharge Physician: Jennye Boroughs   PCP: Denton Lank, MD   Recommendations at discharge:  {Tip this will not be part of the note when signed- Example include specific recommendations for outpatient follow-up, pending tests to follow-up on. (Optional):26781} Follow-up with PCP in 1 to 2 weeks  Discharge Diagnoses: Principal Problem:   Acute metabolic encephalopathy Active Problems:   Acute hepatic encephalopathy (HCC)   HTN (hypertension)   Chronic diastolic CHF (congestive heart failure) (HCC)   COPD (chronic obstructive pulmonary disease) (HCC)   MDD (major depressive disorder)   HLD (hyperlipidemia)   Diabetes mellitus without complication (HCC)   Thrombocytopenia (HCC)   Liver lesion   Liver cirrhosis (HCC)   Unsheltered homelessness   Obesity (BMI 30-39.9)  Resolved Problems:   * No resolved hospital problems. [facility] Course: No notes on file  Assessment and Plan: No notes have been filed under this hospital service. Service: Hospitalist     {Tip this will not be part of the note when signed There is no height or weight on file to calculate BMI. , ,  (Optional):26781}   Consultants: Psychiatrist Procedures performed: None Disposition:  [facility] / behavioral health unit Diet recommendation:  Discharge Diet Orders (From admission, onward)     Start     Ordered   09/24/22 0000  Diet - low sodium heart healthy        09/24/22 1410           Cardiac diet DISCHARGE MEDICATION: Allergies as of 09/24/2022       Reactions   Bee Venom Anaphylaxis   Codeine Itching   Only itching. Recently allergy tested at [facility]   Novocain [procaine]    Sulfa Antibiotics Other (See Comments)        Medication List     TAKE these medications    albuterol (2.5 MG/3ML) 0.083% nebulizer solution Commonly known as:  PROVENTIL Take 3 mLs by nebulization every 4 (four) hours as needed for wheezing or shortness of breath.   albuterol 108 (90 Base) MCG/ACT inhaler Commonly known as: VENTOLIN HFA Inhale 2 puffs into the lungs every 6 (six) hours as needed for wheezing or shortness of breath.   ascorbic acid 500 MG tablet Commonly known as: VITAMIN C Take 1 tablet (500 mg total) by mouth daily.   aspirin EC 81 MG tablet Take 1 tablet (81 mg total) by mouth daily. Swallow whole.   atorvastatin 40 MG tablet Commonly known as: LIPITOR Take 1 tablet (40 mg total) by mouth daily.   citalopram 20 MG tablet Commonly known as: CELEXA Take 1 tablet (20 mg total) by mouth daily.   furosemide 20 MG tablet Commonly known as: LASIX Take 20 mg by mouth every morning.   glucosamine-chondroitin 500-400 MG tablet Take 1 tablet by mouth 3 (three) times daily.   hydrOXYzine 10 MG tablet Commonly known as: ATARAX Take 10 mg by mouth 3 (three) times daily as needed.   Jardiance 10 MG Tabs tablet Generic drug: empagliflozin Take 1 tablet (10 mg total) by mouth daily.   lactulose 10 GM/15ML solution Commonly known as: CHRONULAC Take 45 mLs (30 g total) by mouth 2 (two) times daily.   lansoprazole 30 MG capsule Commonly known as: PREVACID Take 30 mg by mouth daily.   lisinopril 40 MG tablet Commonly known as: ZESTRIL  Take 1 tablet (40 mg total) by mouth daily.   metFORMIN 500 MG 24 hr tablet Commonly known as: GLUCOPHAGE-XR Take 1 tablet (500 mg total) by mouth daily.   multivitamin with minerals Tabs tablet Take 1 tablet by mouth daily.   nitroGLYCERIN 0.4 MG SL tablet Commonly known as: NITROSTAT Place 1 tablet (0.4 mg total) under the tongue every 5 (five) minutes x 3 doses as needed for chest pain.   QUEtiapine 50 MG tablet Commonly known as: SEROQUEL Take 1 tablet (50 mg total) by mouth at bedtime.        Discharge Exam: There were no vitals filed for this visit. ***  Condition at  discharge: good  The results of significant diagnostics from this hospitalization (including imaging, microbiology, ancillary and laboratory) are listed below for reference.   Imaging Studies: MR BRAIN WO CONTRAST  Result Date: 09/23/2022 CLINICAL DATA:  Follow-up examination for stroke. EXAM: MRI HEAD WITHOUT CONTRAST TECHNIQUE: Multiplanar, multiecho pulse sequences of the brain and surrounding structures were obtained without intravenous contrast. COMPARISON:  Prior CT from earlier the same day. FINDINGS: Brain: Examination technically limited as the patient was unable to tolerate the full length of the exam. Additionally, provided images are degraded by motion. Diffusion-weighted sequences demonstrate no evidence for acute or subacute ischemia. Underlying cerebral volume grossly within normal limits. No visible areas of chronic cortical infarction. No mass lesion, mass effect, or midline shift. Ventricles grossly within normal limits for size without hydrocephalus. No extra-axial fluid collection. Vascular: Major intracranial vascular flow voids are grossly maintained at the skull base. Skull and upper cervical spine: Bone marrow signal intensity grossly normal. No scalp soft tissue abnormality. Sinuses/Orbits: Globes orbital soft tissues grossly within normal limits. Paranasal sinuses are grossly clear. No mastoid effusion. Other: None. IMPRESSION: 1. Technically limited exam due to the patient's inability to tolerate the full length of the study and motion. 2. No acute intracranial infarct. No other definite intracranial abnormality. Electronically Signed   By: Jeannine Boga M.D.   On: 09/23/2022 03:19   CT ANGIO HEAD NECK W WO CM W PERF (CODE STROKE)  Result Date: 09/22/2022 CLINICAL DATA:  72 year old male code stroke presentation this morning. EXAM: CT ANGIOGRAPHY HEAD AND NECK CT PERFUSION BRAIN TECHNIQUE: Multidetector CT imaging of the head and neck was performed using the standard  protocol during bolus administration of intravenous contrast. Multiplanar CT image reconstructions and MIPs were obtained to evaluate the vascular anatomy. Carotid stenosis measurements (when applicable) are obtained utilizing NASCET criteria, using the distal internal carotid diameter as the denominator. Multiphase CT imaging of the brain was performed following IV bolus contrast injection. Subsequent parametric perfusion maps were calculated using RAPID software. RADIATION DOSE REDUCTION: This exam was performed according to the departmental dose-optimization program which includes automated exposure control, adjustment of the mA and/or kV according to patient size and/or use of iterative reconstruction technique. CONTRAST:  185m OMNIPAQUE IOHEXOL 350 MG/ML SOLN COMPARISON:  Plain head CT 0845 hours today. Brain MRI 04/28/2022. FINDINGS: CT Brain Perfusion Findings: ASPECTS: 10 CBF (<30%) Volume: 0 mL Perfusion (Tmax>6.0s) volume: 125m although mostly in a quasi- symmetric pattern of the inferior frontal gyri which is suspicious for artifact. Mismatch Volume: Likely zero Infarction Location:No infarct core detected. CTA NECK Skeleton: Absent dentition. Cervical spine disc and endplate degeneration. Some levels of acquired degenerative interbody ankylosis including in the upper thoracic spine. No acute osseous abnormality identified. Upper chest: Central pulmonary artery enlargement on series 4, image 170, visible pulmonary arteries  appear to be normally enhancing. Negative upper lungs. No superior mediastinal lymphadenopathy. Other neck: Negative. Aortic arch: Mild Calcified aortic atherosclerosis. 3 vessel arch configuration. Right carotid system: Mildly tortuous brachiocephalic artery and tortuous proximal right CCA with no plaque or stenosis. Negative right carotid bifurcation. Tortuous right ICA with no stenosis. Left carotid system: Similar tortuosity with no significant plaque or stenosis. Vertebral  arteries: Mildly tortuous proximal right subclavian artery with no plaque or stenosis. Normal right vertebral artery origin. Patent right vertebral artery to the skull base with mild tortuosity, no plaque or stenosis. Normal proximal left subclavian artery and left vertebral artery origin. Codominant left vertebral artery with mild tortuosity is patent to the skull base. No significant plaque or stenosis. CTA HEAD Early intracranial contrast timing. Posterior circulation: Distal vertebral arteries, vertebrobasilar junction, and both PICA origins appear patent without stenosis. Patent basilar artery without stenosis. Patent SCA and PCA origins, fetal type bilateral PCAs. Bilateral PCA branches are within normal limits. Anterior circulation: Both ICA siphons are patent. Mild left siphon cavernous and anterior genu calcified plaque without stenosis. Similar mild right siphon calcified plaque without significant stenosis. Both posterior communicating artery origins appear normal. Patent carotid termini, MCA and ACA origins. Anterior communicating artery and bilateral ACA branches are within normal limits. Left MCA M1 segment and bifurcation are patent without stenosis. Right MCA M1 segment and bifurcation are patent without stenosis. Visible bilateral MCA branches appear relatively symmetric and within normal limits. Venous sinuses: Early contrast timing, not evaluated. Anatomic variants: Fetal type bilateral PCA origins. Review of the MIP images confirms the above findings IMPRESSION: 1. CTA is Negative for large vessel occlusion, with suboptimal intracranial contrast timing. 2. No infarct core detected by CT Perfusion, and likely artifactual small volume of T-max parameter abnormality (mostly at the anterior inferior frontal lobes). 3. Mild for age atherosclerosis in the head and neck. No significant arterial stenosis identified. 4. 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  Electronically Signed   By: Genevie Ann M.D.   On: 09/22/2022 09:58   CT HEAD CODE STROKE WO CONTRAST`  Result Date: 09/22/2022 CLINICAL DATA:  Code stroke.  72 year old male EXAM: CT HEAD WITHOUT CONTRAST TECHNIQUE: Contiguous axial images were obtained from the base of the skull through the vertex without intravenous contrast. RADIATION DOSE REDUCTION: This exam was performed according to the departmental dose-optimization program which includes automated exposure control, adjustment of the mA and/or kV according to patient size and/or use of iterative reconstruction technique. COMPARISON:  Head CT 08/31/2022, brain MRI 04/28/2022. FINDINGS: Brain: Cerebral volume remains normal for age. No midline shift, ventriculomegaly, mass effect, evidence of mass lesion, intracranial hemorrhage or evidence of cortically based acute infarction. Gray-white differentiation appears stable and within normal limits for age throughout. Vascular: Mild Calcified atherosclerosis at the skull base. Stable. No suspicious intracranial vascular hyperdensity. Skull: No acute osseous abnormality identified. Sinuses/Orbits: Visualized paranasal sinuses and mastoids are stable and well aerated. Other: Mild rightward gaze.  Stable scalp soft tissues. ASPECTS [facility] Stroke Program Early CT Score) Total score (0-10 with 10 being normal): 10 IMPRESSION: 1. Stable and normal for age noncontrast CT appearance of the brain. ASPECTS 10. 2. These results were communicated to Dr. Leonel Ramsay at 9:07 am on 09/22/2022 by text page via the [facility] messaging system. Electronically Signed   By: Genevie Ann M.D.   On: 09/22/2022 09:07   DG Chest 2 View  Result Date: 09/13/2022 CLINICAL DATA:  Cough and shortness of breath EXAM: CHEST -  2 VIEW COMPARISON:  08/30/2022 FINDINGS: The heart size and mediastinal contours are within normal limits. Both lungs are clear. The visualized skeletal structures are unremarkable. IMPRESSION: No active cardiopulmonary  disease. Electronically Signed   By: Inez Catalina M.D.   On: 09/13/2022 23:27   MR ABDOMEN W WO CONTRAST  Result Date: 08/31/2022 CLINICAL DATA:  Cirrhotic liver with liver lesions seen on recent CT scan. EXAM: MRI ABDOMEN WITHOUT AND WITH CONTRAST TECHNIQUE: Multiplanar multisequence MR imaging of the abdomen was performed both before and after the administration of intravenous contrast. CONTRAST:  58m GADAVIST GADOBUTROL 1 MMOL/ML IV SOLN COMPARISON:  CT scan 08/30/2022 FINDINGS: Lower chest: Subsegmental atelectasis noted in the lung bases. Hepatobiliary: Markedly motion degraded imaging of the liver. Small or subtle lesions could be obscured. Nodular liver parenchyma consistent with cirrhosis. 2.5 cm posterior right hepatic lobe lesion seen on yesterday's CT imaging is subtle by MRI but visible on pre contrast T1 image 35 of series 12. The marked motion artifact hinders qualitative assessment and accurate measurement. This lesion appears isointense to liver parenchyma on T2 imaging and shows intrinsic T1 shortening on precontrast T1 imaging. There may be some minimal enhancement on arterial phase imaging the becomes isointense to liver parenchyma on more delayed postcontrast imaging. No clear washout and no enhancing rim. Lesion does demonstrate restricted diffusion (see image 113/series 7). Gallbladder is surgically absent. Common duct measures up to 15 mm diameter. No evidence for choledocholithiasis. Pancreas: No focal mass lesion. No dilatation of the main duct. No intraparenchymal cyst. No peripancreatic edema. Although assessment is limited by motion. Spleen:  No gross abnormality within the spleen. Adrenals/Urinary Tract: No adrenal nodule or mass. Kidneys unremarkable. Stomach/Bowel: Stomach is nondistended. There appears to be diffuse gastric wall thickening which may be related to underdistention although gastritis not excluded. No small bowel or colonic dilatation within the visualized abdomen.  Vascular/Lymphatic: No abdominal aortic aneurysm. No abdominal lymphadenopathy. Portal vein, superior mesenteric vein, and splenic vein are patent. Marked enlargement/recanalization of the paraumbilical vein evident. Other:  No substantial ascites. Musculoskeletal: Diffuse body wall edema evident. No focal suspicious marrow enhancement within the visualized bony anatomy. IMPRESSION: 1. Markedly motion degraded imaging of the liver. 2.5 cm posterior right hepatic lobe lesion seen on yesterday's CT imaging is partially obscured by motion. There may be some minimal enhancement on arterial phase imaging that becomes isointense to liver parenchyma on more delayed postcontrast imaging. Although there is no washout or enhancing rim, the lesion does demonstrate restricted diffusion. Imaging features are compatible with a LI-RADS 4 lesion, probably hepatocellular carcinoma. Consider repeat MRI after resolution of acute symptoms when patient is better able to participate in positioning and reproducible breath holding. 2. Nodular liver parenchyma consistent with cirrhosis. 3. Marked enlargement/recanalization of the paraumbilical vein consistent with portal venous hypertension. 4. Diffuse gastric wall thickening may be related to underdistention although other etiology such as gastritis not excluded. 5. Diffuse body wall edema. Electronically Signed   By: EMisty StanleyM.D.   On: 08/31/2022 05:26   MR LUMBAR SPINE WO CONTRAST  Result Date: 08/31/2022 CLINICAL DATA:  Weakness EXAM: MRI LUMBAR SPINE WITHOUT CONTRAST TECHNIQUE: Multiplanar, multisequence MR imaging of the lumbar spine was performed. No intravenous contrast was administered. COMPARISON:  No prior MRI of the lumbar spine available FINDINGS: Segmentation:  5 lumbar type vertebral bodies. Alignment: No listhesis. Mild dextrocurvature. Preservation of the normal lumbar lordosis. Vertebrae:  No fracture, evidence of discitis, or bone lesion. Conus medullaris and  cauda  equina: Conus extends to the L2 level. Conus and cauda equina appear normal. Paraspinal and other soft tissues: Negative. Disc levels: T12-L1: No significant disc bulge. No spinal canal stenosis or neural foraminal narrowing. L1-L2: No significant disc bulge. No spinal canal stenosis or neural foraminal narrowing. L2-L3: No significant disc bulge. No spinal canal stenosis or neural foraminal narrowing. L3-L4: Mild disc bulge with small central protrusion with annular fissure. Mild facet arthropathy. Narrowing of the lateral recesses. No spinal canal stenosis. Mild bilateral neural foraminal narrowing. L4-L5: Minimal disc bulge with central annular fissure. Mild-to-moderate facet arthropathy. No spinal canal stenosis. Mild bilateral neural foraminal narrowing. L5-S1: No significant disc bulge. Mild facet arthropathy. No spinal canal stenosis. Mild left neural foraminal narrowing. IMPRESSION: 1. No spinal canal stenosis. 2. Mild neural foraminal narrowing bilaterally at L3-L4 and L4-L5 and on the left at L5-S1. 3. Narrowing of the lateral recesses at L3-L4 could affect the descending L4 nerve roots. 4. Annular fissures at L3-L4 and L4-L5, which can be acutely painful. Electronically Signed   By: Merilyn Baba M.D.   On: 08/31/2022 03:41   CT HEAD WO CONTRAST (5MM)  Result Date: 08/31/2022 CLINICAL DATA:  Mental status change, unknown cause EXAM: CT HEAD WITHOUT CONTRAST TECHNIQUE: Contiguous axial images were obtained from the base of the skull through the vertex without intravenous contrast. RADIATION DOSE REDUCTION: This exam was performed according to the departmental dose-optimization program which includes automated exposure control, adjustment of the mA and/or kV according to patient size and/or use of iterative reconstruction technique. COMPARISON:  CT head 08/12/2022 FINDINGS: Brain: No evidence of large-territorial acute infarction. No parenchymal hemorrhage. No mass lesion. No extra-axial  collection. No mass effect or midline shift. No hydrocephalus. Basilar cisterns are patent. Vascular: No hyperdense vessel. Skull: No acute fracture or focal lesion. Sinuses/Orbits: Paranasal sinuses and mastoid air cells are clear. The orbits are unremarkable. Other: None. IMPRESSION: No acute intracranial abnormality. Electronically Signed   By: Iven Finn M.D.   On: 08/31/2022 00:45   CT ABDOMEN PELVIS W CONTRAST  Result Date: 08/30/2022 CLINICAL DATA:  Abdominal pain, acute, nonlocalized EXAM: CT ABDOMEN AND PELVIS WITH CONTRAST TECHNIQUE: Multidetector CT imaging of the abdomen and pelvis was performed using the standard protocol following bolus administration of intravenous contrast. RADIATION DOSE REDUCTION: This exam was performed according to the departmental dose-optimization program which includes automated exposure control, adjustment of the mA and/or kV according to patient size and/or use of iterative reconstruction technique. CONTRAST:  118m OMNIPAQUE IOHEXOL 350 MG/ML SOLN COMPARISON:  CT chest 08/12/2022 FINDINGS: Lower chest: No acute abnormality. At least two vessel coronary calcification. Hepatobiliary: Nodular hepatic contour. Redemonstration of a poorly visualized hypodense 2.2 cm right peripheral posterior hepatic lobe mass (4:22). Query other finding along the hepatic dome that is poorly visualized (4:17). Status post cholecystectomy. No intrahepatic biliary dilatation. Common bile duct enlarged measuring up to 1.7 cm which can be seen in the setting of post cholecystectomy setting. Pancreas: No focal lesion. Normal pancreatic contour. No surrounding inflammatory changes. No main pancreatic ductal dilatation. Spleen: Normal in size without focal abnormality. Splenules are noted. Adrenals/Urinary Tract: No adrenal nodule bilaterally. Bilateral kidneys enhance symmetrically. Right kidney nephrolithiasis measuring up to 6 mm. No left nephrolithiasis. No hydronephrosis. No hydroureter.  The urinary bladder is unremarkable. Stomach/Bowel: Stomach is within normal limits. No evidence of bowel wall thickening or dilatation. Appendix appears normal. Vascular/Lymphatic: The main portal, splenic, superior mesenteric veins are patent. No abdominal aorta or iliac aneurysm. Mild atherosclerotic plaque  of the aorta and its branches. No abdominal, pelvic, or inguinal lymphadenopathy. Reproductive: Prostate is unremarkable. Other: No intraperitoneal free fluid. No intraperitoneal free gas. No organized fluid collection. Musculoskeletal: No abdominal wall hernia or abnormality. No suspicious lytic or blastic osseous lesions. No acute displaced fracture. Multilevel degenerative changes of the spine. IMPRESSION: 1. Cirrhosis with no definite evidence of portal hypertension. Poorly visualized hypodense 2.2 cm right peripheral posterior hepatic lobe mass. Query other finding along the hepatic dome that is poorly visualized. Findings suspicious for a hepatocellular carcinoma. Please note that liver protocol enhanced MR and CT are the most sensitive tests for the screening detection of hepatocellular carcinoma in the high risk setting of cirrhosis. When the patient is clinically stable and able to follow directions and hold their breath (preferably as an outpatient) further evaluation with dedicated outpatient MRI liver protocol should be considered. 2. A 6 mm right nephrolithiasis. 3. Enlarged common bile duct which can be seen in the post cholecystectomy setting. Electronically Signed   By: Iven Finn M.D.   On: 08/30/2022 20:50   CT Angio Chest PE W and/or Wo Contrast  Result Date: 08/30/2022 CLINICAL DATA:  Weakness. EXAM: CT ANGIOGRAPHY CHEST WITH CONTRAST TECHNIQUE: Multidetector CT imaging of the chest was performed using the standard protocol during bolus administration of intravenous contrast. Multiplanar CT image reconstructions and MIPs were obtained to evaluate the vascular anatomy. RADIATION DOSE  REDUCTION: This exam was performed according to the departmental dose-optimization program which includes automated exposure control, adjustment of the mA and/or kV according to patient size and/or use of iterative reconstruction technique. CONTRAST:  158m OMNIPAQUE IOHEXOL 350 MG/ML SOLN COMPARISON:  August 12, 2022 FINDINGS: Cardiovascular: There is mild calcification of the aortic arch, without evidence of aortic aneurysm. Satisfactory opacification of the pulmonary arteries to the segmental level. No evidence of pulmonary embolism. The main pulmonary artery is dilated and measures 4.4 cm. The cardiac silhouette is mildly enlarged with marked severity coronary artery calcification. No pericardial effusion. Mediastinum/Nodes: No enlarged mediastinal, hilar, or axillary lymph nodes. Thyroid gland, trachea, and esophagus demonstrate no significant findings. Lungs/Pleura: Mild to moderate severity atelectasis is seen along the posterior aspect of the bilateral lower lobes. There is no evidence of a pleural effusion or pneumothorax. Upper Abdomen: The liver is cirrhotic in appearance. Radiopaque surgical clips are seen within the gallbladder fossa. Musculoskeletal: A stable nondisplaced fracture deformity is seen involving the body of the sternum. Multilevel degenerative changes are seen throughout the thoracic spine. Review of the MIP images confirms the above findings. IMPRESSION: 1. No evidence of pulmonary embolism. 2. Mild to moderate severity bilateral lower lobe atelectasis. 3. Stable nondisplaced fracture deformity involving the body of the sternum. 4. Cardiomegaly with marked severity coronary artery calcification. 5. Dilated main pulmonary artery which may represent pulmonary hypertension. 6. Cirrhotic liver. 7. Evidence of prior cholecystectomy. 8. Aortic atherosclerosis. Aortic Atherosclerosis (ICD10-I70.0). Electronically Signed   By: TVirgina NorfolkM.D.   On: 08/30/2022 20:46   DG Chest 1  View  Result Date: 08/30/2022 CLINICAL DATA:  Shortness of breath, weakness EXAM: CHEST  1 VIEW COMPARISON:  08/12/2022 FINDINGS: Cardiomegaly. Both lungs are clear. The visualized skeletal structures are unremarkable. IMPRESSION: Cardiomegaly without acute abnormality of the lungs in AP portable projection. Electronically Signed   By: ADelanna AhmadiM.D.   On: 08/30/2022 19:17    Microbiology: Results for orders placed or performed during the hospital encounter of 09/15/22  Resp panel by RT-PCR (RSV, Flu A&B, Covid) Anterior Nasal  Swab     Status: None   Collection Time: 09/17/22  5:25 PM   Specimen: Anterior Nasal Swab  Result Value Ref Range Status   SARS Coronavirus 2 by RT PCR NEGATIVE NEGATIVE Final    Comment: (NOTE) SARS-CoV-2 target nucleic acids are NOT DETECTED.  The SARS-CoV-2 RNA is generally detectable in upper respiratory specimens during the acute phase of infection. The lowest concentration of SARS-CoV-2 viral copies this assay can detect is 138 copies/mL. A negative result does not preclude SARS-Cov-2 infection and should not be used as the sole basis for treatment or other patient management decisions. A negative result may occur with  improper specimen collection/handling, submission of specimen other than nasopharyngeal swab, presence of viral mutation(s) within the areas targeted by this assay, and inadequate number of viral copies(<138 copies/mL). A negative result must be combined with clinical observations, patient history, and epidemiological information. The expected result is Negative.  Fact Sheet for Patients:  EntrepreneurPulse.com.au  Fact Sheet for Healthcare Providers:  IncredibleEmployment.be  This test is no t yet approved or cleared by the [place] FDA and  has been authorized for detection and/or diagnosis of SARS-CoV-2 by FDA under an Emergency Use Authorization (EUA). This EUA will remain  in effect  (meaning this test can be used) for the duration of the COVID-19 declaration under Section 564(b)(1) of the Act, 21 U.S.C.section 360bbb-3(b)(1), unless the authorization is terminated  or revoked sooner.       Influenza A by PCR NEGATIVE NEGATIVE Final   Influenza B by PCR NEGATIVE NEGATIVE Final    Comment: (NOTE) The Xpert Xpress SARS-CoV-2/FLU/RSV plus assay is intended as an aid in the diagnosis of influenza from Nasopharyngeal swab specimens and should not be used as a sole basis for treatment. Nasal washings and aspirates are unacceptable for Xpert Xpress SARS-CoV-2/FLU/RSV testing.  Fact Sheet for Patients: EntrepreneurPulse.com.au  Fact Sheet for Healthcare Providers: IncredibleEmployment.be  This test is not yet approved or cleared by the [place] FDA and has been authorized for detection and/or diagnosis of SARS-CoV-2 by FDA under an Emergency Use Authorization (EUA). This EUA will remain in effect (meaning this test can be used) for the duration of the COVID-19 declaration under Section 564(b)(1) of the Act, 21 U.S.C. section 360bbb-3(b)(1), unless the authorization is terminated or revoked.     Resp Syncytial Virus by PCR NEGATIVE NEGATIVE Final    Comment: (NOTE) Fact Sheet for Patients: EntrepreneurPulse.com.au  Fact Sheet for Healthcare Providers: IncredibleEmployment.be  This test is not yet approved or cleared by the [place] FDA and has been authorized for detection and/or diagnosis of SARS-CoV-2 by FDA under an Emergency Use Authorization (EUA). This EUA will remain in effect (meaning this test can be used) for the duration of the COVID-19 declaration under Section 564(b)(1) of the Act, 21 U.S.C. section 360bbb-3(b)(1), unless the authorization is terminated or revoked.  Performed at [facility], [address] [place], [place] [address]      Labs: CBC: Recent Labs  Lab 09/22/22 1616  WBC 2.5*  NEUTROABS 1.2*  HGB 11.4*  HCT 33.6*  MCV 93.6  PLT 80*   Basic Metabolic Panel: Recent Labs  Lab 09/22/22 1420  NA 140  K 3.6  CL 110  CO2 21*  GLUCOSE 83  BUN 19  CREATININE 0.87  CALCIUM 9.2   Liver Function Tests: Recent Labs  Lab 09/22/22 1420  AST 41  ALT 25  ALKPHOS 142*  BILITOT 1.7*  PROT 6.5  ALBUMIN 2.5*   CBG: Recent Labs  Lab 09/23/22 1645 09/23/22 2053 09/24/22 0754 09/24/22 0823 09/24/22 1148  GLUCAP 105* 99 64* 116* 100*    Discharge time spent: greater than 30 minutes.  Signed: Jennye Boroughs, MD [facility] 09/24/2022

## 2022-09-24 NOTE — Tx Team (Signed)
Initial Treatment Plan 09/24/2022 6:17 PM KHANI PAINO EZV:[id number]    PATIENT STRESSORS: Financial difficulties   Health problems   Medication change or noncompliance   Other: homelessness     PATIENT STRENGTHS: Ability for insight  Active sense of humor  Communication skills  Motivation for treatment/growth    PATIENT IDENTIFIED PROBLEMS: Depression  Homelessness                   DISCHARGE CRITERIA:  Improved stabilization in mood, thinking, and/or behavior Motivation to continue treatment in a less acute level of care  PRELIMINARY DISCHARGE PLAN: Outpatient therapy Placement in alternative living arrangements  PATIENT/FAMILY INVOLVEMENT: This treatment plan has been presented to and reviewed with the patient, [name], and/or family member.  The patient and family have been given the opportunity to ask questions and make suggestions.  Aleen Sells, RN 09/24/2022, 6:17 PM

## 2022-09-24 NOTE — Plan of Care (Signed)
Pt endorses anxiety however denies depression at this time. Pt denies SI/HI/AVH or pain at this time. Pt is calm and cooperative. Pt is medication compliant. Pt provided with support and encouragement. Pt monitored q15 minutes for safety per unit policy. Plan of care ongoing.   Pt appears as stable and independent. Pt was admitted to [facility] after being medically cleared from [facility]. Pt's skin assessment was preformed and documented upon admission. Pt has old healed scars on abdomin, lower back, and bilat arms, Pt also has bruising for arms, legs, and right side area.     Problem: Education: Goal: Knowledge of General Education information will improve Description: Including pain rating scale, medication(s)/side effects and non-pharmacologic comfort measures Outcome: Progressing   Problem: Coping: Goal: Level of anxiety will decrease Outcome: Not Progressing

## 2022-09-24 NOTE — Consult Note (Signed)
The patient was observed sitting up in the bed watching TV. He was pleasant and cooperative engaging easily in conversation. He spoke in clear and coherent sentences. He denied current feelings of depression or suicidal ideation. His anxiety is reported a 1/10 with 10 being the highest. The trigger is reported as being in the hospital. The patient reported that he will receive his monthly check on Friday 2/2, and at that time he will have the funds to find a place to stay. He reported once he receives his check on Friday, his plan is to go to a Motel until he is able to find a boarding house.   Based on this information, once medically cleared, it is NOT recommended that the patient be discharged before Friday, 2/2 to ensure a safe discharge disposition with follow-up medical and psychiatric care and resources due to a potential relapse of suicidal thoughts and behavior. Additionally, he has significant comorbidities that place him at a higher risk for exacerbation of depression symptoms with suicidal thoughts, as the patient is currently homeless and living in a tent.

## 2022-09-25 DIAGNOSIS — F332 Major depressive disorder, 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 psychotic features: Secondary | ICD-10-CM | POA: Diagnosis not present

## 2022-09-25 LAB — URINALYSIS, COMPLETE (UACMP) WITH MICROSCOPIC
Bacteria, UA: NONE SEEN
Bilirubin Urine: NEGATIVE
Glucose, UA: NEGATIVE mg/dL
Hgb urine dipstick: NEGATIVE
Ketones, ur: NEGATIVE mg/dL
Leukocytes,Ua: NEGATIVE
Nitrite: NEGATIVE
Protein, ur: 30 mg/dL — AB
Specific Gravity, Urine: 1.023 (ref 1.005–1.030)
pH: 5 (ref 5.0–8.0)

## 2022-09-25 LAB — GLUCOSE, CAPILLARY
Glucose-Capillary: 75 mg/dL (ref 70–99)
Glucose-Capillary: 88 mg/dL (ref 70–99)

## 2022-09-25 NOTE — Progress Notes (Signed)
OT Cancellation Note  Patient Details Name: KEVANTE LUNT MRN: [id number] DOB: 1950/09/28   Cancelled Treatment:    Reason Eval/Treat Not Completed: OT screened, no needs identified, will sign off. Per chart review and discussing with PT pt is at baseline level of functioning for self care and mobility. OT to complete order.   Darleen Crocker, MS, OTR/L , CBIS ascom [phone]  09/25/22, 1:42 PM

## 2022-09-25 NOTE — Progress Notes (Signed)
Patient denies SI, HI, and AVH. He denies any physical problems at time of assessment. Patient isolates to his room for the shift and did not want to come to the dayroom for snack. He is compliant with scheduled medications. Support and encouragement provided. Patient remains safe on the unit at this time.

## 2022-09-25 NOTE — Progress Notes (Signed)
Patient denies SI, HI, and AVH. He says he has been having some diarrhea from the lactulose. Patient also says he has only been able to sleep 3-4 hours a night and that he is tossing and turning in bed for the rest of the night. Appetite is good. Patient is compliant with scheduled medications. Patient interacts well with staff and other patients. Patient remains safe on the unit at this time.
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
## 2022-09-25 NOTE — BHH Suicide Risk Assessment (Signed)
[facility] INPATIENT:  Family/Significant Other Suicide Prevention Education  Suicide Prevention Education:  Education Completed; [name], [place], [phone] has been identified by the patient as the family member/significant other with whom the patient will be residing, and identified as the person(s) who will aid the patient in the event of a mental health crisis (suicidal ideations/suicide attempt).  With written consent from the patient, the family member/significant other has been provided the following suicide prevention education, prior to the and/or following the discharge of the patient.  The suicide prevention education provided includes the following: Suicide risk factors Suicide prevention and interventions National Suicide Hotline telephone number [facility] assessment telephone number [facility] Emergency Assistance [place] and/or Residential Mobile Crisis Unit telephone number  Request made of family/significant other to: Remove weapons (e.g., guns, rifles, knives), all items previously/currently identified as safety concern.   Remove drugs/medications (over-the-counter, prescriptions, illicit drugs), all items previously/currently identified as a safety concern.  The family member/significant other verbalizes understanding of the suicide prevention education information provided.  The family member/significant other agrees to remove the items of safety concern listed above.  Daughter reports "he's been depressed because he's homeless, he's been living with [place] in a tent".  She reports "he was having a dream about being in the war because he'd been in the [employer]".  She reports that her boyfriend "helped get him to the road so we could get him some help".  She reports that for several days prior to admission patient has been making comments about wanting to die or be dead.  She reports that the patient did have access to knives, however, she and her  boyfriend have removed them.   She reports that patient does have somewhere to go at discharge.    Rozann Lesches 09/25/2022, 12:43 PM

## 2022-09-25 NOTE — H&P (Signed)
Psychiatric Admission Assessment Adult  Patient Identification: [name] MRN:  [id number] Date of Evaluation:  09/25/2022 Chief Complaint:  MDD (major depressive disorder) [F32.9] Principal Diagnosis: MDD (major depressive disorder) Diagnosis:  Principal Problem:   MDD (major depressive disorder)  History of Present Illness:  [name] is a 72 year old white male who was voluntarily admitted to inpatient psychiatry for suicidal ideation.  He is homeless and lives in a tent in [place].  He tells me that he has PTSD from his brief training in the [employer] when he was 72 years old.  Apparently, someone was killed in front of him.  He is currently on disability.  He does have a Psychiatrist who lives in [place] but is also homeless.  He last had housing in 2018 but was evicted after the landlord was a fraud.  He sees Dr. Posey Pronto at the [facility] in [place].  He has a history of blindness, deaf, CHF, hypertension, liver and kidney disease, and type 2 diabetes.  He endorses anhedonia, difficulty sleeping, depressed mood, anxiety and hallucinations when he has flashbacks.  He was last psychiatrically hospitalized in the 1990s and does not have any past suicide attempts.  He currently does not have a psychiatrist.  He was seen in the emergency room recently for chest pain and a URI and given a prescription for Tessalon.  More recently seen by psychiatric nurse practitioner because he stated that he was going to cut his wrists.  He informs me that he does not do well on tricyclic antidepressants.   2 days ago the nurses found patient to be obtunded and unresponsive even though his vital signs were stable.  Internal medicine hospitalist transfer him to their floor for what sounded to be encephalopathy or stroke.  Neurology was consulted. Etiology is not clear.  Potential differential diagnoses include stroke and hepatic encephalopathy.  CT head negative.  CT angiogram of head and neck  negative for LVO.  Consulted Dr. Leonel Ramsay of neurology.  It was found that he has chronic liver disease and his ammonia level was high and he was treated with lactulose with a return to baseline and transfer back to psychiatry.  Associated Signs/Symptoms: Depression Symptoms:  depressed mood, insomnia, suicidal thoughts without plan, anxiety, (Hypo) Manic Symptoms: None Anxiety Symptoms:  Excessive Worry, Social Anxiety, Psychotic Symptoms:   None PTSD Symptoms: NA Total Time spent with patient: 1 hour  Past Psychiatric History: Not too much  Is the patient at risk to self? Yes.    Has the patient been a risk to self in the past 6 months? Yes.    Has the patient been a risk to self within the distant past? Yes.    Is the patient a risk to others? No.  Has the patient been a risk to others in the past 6 months? No.  Has the patient been a risk to others within the distant past? No.   [place] Scale:  [facility] Admission (Current) from 09/24/2022 in [place] Admission (Discharged) from 09/17/2022 in [place] ED from 09/15/2022 in [facility] at [place] No Risk No Risk No Risk        Prior Inpatient Therapy: No. If yes, describe  Prior Outpatient Therapy: Yes.   If yes, describe [facility]  Alcohol Screening: 1. How often do you have a drink containing alcohol?: Never 2. How many drinks containing alcohol do you have on a typical day  when you are drinking?: 1 or 2 3. How often do you have six or more drinks on one occasion?: Never AUDIT-C Score: 0 4. How often during the last year have you found that you were not able to stop drinking once you had started?: Never 5. How often during the last year have you failed to do what was normally expected from you because of drinking?: Never 6. How often during the last year have you needed a first drink in the morning to get yourself going  after a heavy drinking session?: Never 7. How often during the last year have you had a feeling of guilt of remorse after drinking?: Never 8. How often during the last year have you been unable to remember what happened the night before because you had been drinking?: Never 9. Have you or someone else been injured as a result of your drinking?: No 10. Has a relative or friend or a doctor or another health worker been concerned about your drinking or suggested you cut down?: No Alcohol Use Disorder Identification Test Final Score (AUDIT): 0 Alcohol Brief Interventions/Follow-up: Alcohol education/Brief advice Substance Abuse History in the last 12 months:  No. Consequences of Substance Abuse: NA Previous Psychotropic Medications: Yes  Psychological Evaluations: Yes  Past Medical History:  Past Medical History:  Diagnosis Date   Asthma    CHF (congestive heart failure) ([place])    COPD (chronic obstructive pulmonary disease) ([place])    COVID-19 03/2020   diagnosed in August 2021   Diabetes mellitus without complication ([place])    Homelessness    Hypertension    Infestation by bed bug    Migraine    Obesity    Sleep apnea     Past Surgical History:  Procedure Laterality Date   CARDIAC CATHETERIZATION     CHOLECYSTECTOMY     EYE SURGERY     INNER EAR SURGERY     NOSE SURGERY     Family History:  Family History  Problem Relation Age of Onset   Hypertension Mother    Heart disease Mother    Heart failure Maternal Grandmother    Family Psychiatric  History: Unremarkable Tobacco Screening:  Social History   Tobacco Use  Smoking Status Former  Smokeless Tobacco Never    [place] Tobacco Counseling     Are you interested in Tobacco Cessation Medications?  N/A, patient does not use tobacco products Counseled patient on smoking cessation:  N/A, patient does not use tobacco products Reason Tobacco Screening Not Completed: No value filed.       Social History:  Social History    Substance and Sexual Activity  Alcohol Use Never     Social History   Substance and Sexual Activity  Drug Use Never    Additional Social History:                           Allergies:   Allergies  Allergen Reactions   Bee Venom Anaphylaxis   Codeine Itching    Only itching. Recently allergy tested at [facility]   Novocain [Procaine]    Sulfa Antibiotics Other (See Comments)   Lab Results:  Results for orders placed or performed during the hospital encounter of 09/24/22 (from the past 48 hour(s))  Glucose, capillary     Status: None   Collection Time: 09/24/22  4:51 PM  Result Value Ref Range   Glucose-Capillary 89 70 - 99 mg/dL    Comment: Glucose  reference range applies only to samples taken after fasting for at least 8 hours.  Glucose, capillary     Status: Abnormal   Collection Time: 09/24/22  8:21 PM  Result Value Ref Range   Glucose-Capillary 147 (H) 70 - 99 mg/dL    Comment: Glucose reference range applies only to samples taken after fasting for at least 8 hours.  Glucose, capillary     Status: None   Collection Time: 09/25/22  7:43 AM  Result Value Ref Range   Glucose-Capillary 75 70 - 99 mg/dL    Comment: Glucose reference range applies only to samples taken after fasting for at least 8 hours.    Blood Alcohol level:  Lab Results  Component Value Date   ETH <10 09/15/2022   ETH <10 99/37/1696    Metabolic Disorder Labs:  Lab Results  Component Value Date   HGBA1C 4.8 09/23/2022   MPG 91.06 09/23/2022   MPG 93.93 09/15/2022   No results found for: "PROLACTIN" Lab Results  Component Value Date   CHOL 140 09/23/2022   TRIG 41 09/23/2022   HDL 40 (L) 09/23/2022   CHOLHDL 3.5 09/23/2022   VLDL 8 09/23/2022   LDLCALC 92 09/23/2022   LDLCALC 57 04/29/2022    Current Medications: Current Facility-Administered Medications  Medication Dose Route Frequency Provider Last Rate Last Admin   acetaminophen (TYLENOL) tablet 650 mg  650 mg Oral Q4H PRN  Bennett, Christal H, NP       Or   acetaminophen (TYLENOL) 160 MG/5ML solution 650 mg  650 mg Per Tube Q4H PRN Bennett, Christal H, NP       Or   acetaminophen (TYLENOL) suppository 650 mg  650 mg Rectal Q4H PRN Bennett, Christal H, NP       albuterol (PROVENTIL) (2.5 MG/3ML) 0.083% nebulizer solution 2.5 mg  2.5 mg Nebulization Q4H PRN Bennett, Christal H, NP       aspirin suppository 300 mg  300 mg Rectal Daily Bennett, Christal H, NP       Or   aspirin tablet 325 mg  325 mg Oral Daily Bennett, Christal H, NP   325 mg at 09/25/22 1017   atorvastatin (LIPITOR) tablet 40 mg  40 mg Oral Daily Bennett, Christal H, NP   40 mg at 09/25/22 1017   citalopram (CELEXA) tablet 20 mg  20 mg Oral Daily Bennett, Christal H, NP   20 mg at 09/25/22 1017   dextromethorphan-guaiFENesin (MUCINEX DM) 30-600 MG per 12 hr tablet 1 tablet  1 tablet Oral BID PRN Bennett, Christal H, NP       enoxaparin (LOVENOX) injection 55 mg  0.5 mg/kg Subcutaneous Q24H Bennett, Christal H, NP   55 mg at 09/24/22 2143   hydrALAZINE (APRESOLINE) injection 5 mg  5 mg Intravenous Q2H PRN Bennett, Christal H, NP       hydrOXYzine (ATARAX) tablet 10 mg  10 mg Oral TID PRN Bennett, Christal H, NP       lactulose (CHRONULAC) 10 GM/15ML solution 30 g  30 g Oral BID Bennett, Christal H, NP   30 g at 09/25/22 1016   lisinopril (ZESTRIL) tablet 20 mg  20 mg Oral Daily Bennett, Christal H, NP   20 mg at 09/25/22 1017   nitroGLYCERIN (NITROSTAT) SL tablet 0.4 mg  0.4 mg Sublingual Q5 Min x 3 PRN Bennett, Christal H, NP       ondansetron (ZOFRAN) injection 4 mg  4 mg Intravenous Q8H PRN Bennett, Christal H,  NP       pantoprazole (PROTONIX) EC tablet 20 mg  20 mg Oral Daily Bennett, Christal H, NP   20 mg at 09/25/22 1017   QUEtiapine (SEROQUEL) tablet 50 mg  50 mg Oral QHS Bennett, Christal H, NP   50 mg at 09/24/22 2142   senna-docusate (Senokot-S) tablet 1 tablet  1 tablet Oral QHS PRN Bennett, Christal H, NP       sodium chloride (OCEAN)  0.65 % nasal spray 1 spray  1 spray Each Nare PRN Bennett, Christal H, NP       PTA Medications: Medications Prior to Admission  Medication Sig Dispense Refill Last Dose   albuterol (PROVENTIL) (2.5 MG/3ML) 0.083% nebulizer solution Take 3 mLs by nebulization every 4 (four) hours as needed for wheezing or shortness of breath. (Patient not taking: Reported on 09/23/2022) 75 mL 0    albuterol (VENTOLIN HFA) 108 (90 Base) MCG/ACT inhaler Inhale 2 puffs into the lungs every 6 (six) hours as needed for wheezing or shortness of breath. (Patient not taking: Reported on 09/17/2022) 1 each 5    ascorbic acid (VITAMIN C) 500 MG tablet Take 1 tablet (500 mg total) by mouth daily.      aspirin EC 81 MG tablet Take 1 tablet (81 mg total) by mouth daily. Swallow whole. 30 tablet 5    atorvastatin (LIPITOR) 40 MG tablet Take 1 tablet (40 mg total) by mouth daily. (Patient not taking: Reported on 09/15/2022) 30 tablet 5    citalopram (CELEXA) 20 MG tablet Take 1 tablet (20 mg total) by mouth daily. 30 tablet 3    furosemide (LASIX) 20 MG tablet Take 20 mg by mouth every morning.      glucosamine-chondroitin 500-400 MG tablet Take 1 tablet by mouth 3 (three) times daily.      hydrOXYzine (ATARAX) 10 MG tablet Take 10 mg by mouth 3 (three) times daily as needed.      JARDIANCE 10 MG TABS tablet Take 1 tablet (10 mg total) by mouth daily. 30 tablet 5    lactulose (CHRONULAC) 10 GM/15ML solution Take 45 mLs (30 g total) by mouth 2 (two) times daily. 236 mL 0    lansoprazole (PREVACID) 30 MG capsule Take 30 mg by mouth daily.      lisinopril (ZESTRIL) 40 MG tablet Take 1 tablet (40 mg total) by mouth daily. 30 tablet 5    metFORMIN (GLUCOPHAGE-XR) 500 MG 24 hr tablet Take 1 tablet (500 mg total) by mouth daily. 30 tablet 5    Multiple Vitamin (MULTIVITAMIN WITH MINERALS) TABS tablet Take 1 tablet by mouth daily.      nitroGLYCERIN (NITROSTAT) 0.4 MG SL tablet Place 1 tablet (0.4 mg total) under the tongue every 5 (five)  minutes x 3 doses as needed for chest pain. 30 tablet 5    QUEtiapine (SEROQUEL) 50 MG tablet Take 1 tablet (50 mg total) by mouth at bedtime. 30 tablet 3     Musculoskeletal: Strength & Muscle Tone: within normal limits Gait & Station: normal Patient leans: N/A            Psychiatric Specialty Exam:  Presentation  General Appearance:  Disheveled  Eye Contact: Good  Speech: Clear and Coherent  Speech Volume: Normal  Handedness: Right   Mood and Affect  Mood: Euphoric  Affect: Congruent   Thought Process  Thought Processes: Coherent  Duration of Psychotic Symptoms:N/A Past Diagnosis of Schizophrenia or Psychoactive disorder: No  Descriptions of Associations:Intact  Orientation:Full (  Time, Place and Person)  Thought Content:WDL  Hallucinations:No data recorded Ideas of Reference:None  Suicidal Thoughts:No data recorded Homicidal Thoughts:No data recorded  Sensorium  Memory: Immediate Good; Recent Good  Judgment: Fair  Insight: Poor   Executive Functions  Concentration: Good  Attention Span: Good  Recall: Good  Fund of Knowledge: Poor  Language: Fair   Psychomotor Activity  Psychomotor Activity:No data recorded  Assets  Assets: Financial Resources/Insurance; Resilience   Sleep  Sleep:No data recorded   Physical Exam: Physical Exam Vitals and nursing note reviewed.  Constitutional:      Appearance: Normal appearance. He is normal weight.  HENT:     Head: Normocephalic and atraumatic.     Nose: Nose normal.     Mouth/Throat:     Pharynx: Oropharynx is clear.  Eyes:     Extraocular Movements: Extraocular movements intact.     Pupils: Pupils are equal, round, and reactive to light.  Cardiovascular:     Rate and Rhythm: Normal rate and regular rhythm.     Pulses: Normal pulses.     Heart sounds: Normal heart sounds.  Pulmonary:     Effort: Pulmonary effort is normal.     Breath sounds: Normal breath  sounds.  Abdominal:     General: Abdomen is flat. Bowel sounds are normal.     Palpations: Abdomen is soft.  Musculoskeletal:        General: Normal range of motion.     Cervical back: Normal range of motion and neck supple.  Skin:    General: Skin is warm and dry.  Neurological:     General: No focal deficit present.     Mental Status: He is alert and oriented to person, place, and time.  Psychiatric:        Attention and Perception: Attention and perception normal.        Mood and Affect: Mood is depressed. Affect is flat.        Speech: Speech normal.        Behavior: Behavior normal. Behavior is cooperative.        Thought Content: Thought content normal.        Cognition and Memory: Cognition and memory normal.        Judgment: Judgment is impulsive.    Review of Systems  Constitutional: Negative.   HENT: Negative.    Eyes: Negative.   Respiratory: Negative.    Cardiovascular: Negative.   Gastrointestinal: Negative.   Genitourinary: Negative.   Musculoskeletal: Negative.   Skin: Negative.   Neurological: Negative.   Endo/Heme/Allergies: Negative.   Psychiatric/Behavioral:  Positive for depression. The patient is nervous/anxious and has insomnia.    Blood pressure (!) 156/64, pulse (!) 51, temperature 98.6 F (37 C), temperature source Oral, resp. rate 18, height '5\' 6"'$  (1.676 m), weight 111 kg, SpO2 100 %. Body mass index is 39.5 kg/m.  Treatment Plan Summary: Daily contact with patient to assess and evaluate symptoms and progress in treatment, Medication management, and Plan restart psychiatric medicines  Observation Level/Precautions:  15 minute checks  Laboratory:  CBC Chemistry Profile  Psychotherapy:    Medications:    Consultations:    Discharge Concerns:    Estimated LOS:  Other:     Physician Treatment Plan for Primary Diagnosis: MDD (major depressive disorder) Long Term Goal(s): Improvement in symptoms so as ready for discharge  Short Term Goals:  Ability to identify changes in lifestyle to reduce recurrence of condition will improve, Ability to verbalize feelings  will improve, Ability to disclose and discuss suicidal ideas, Ability to demonstrate self-control will improve, Ability to identify and develop effective coping behaviors will improve, Ability to maintain clinical measurements within normal limits will improve, Compliance with prescribed medications will improve, and Ability to identify triggers associated with substance abuse/mental health issues will improve  Physician Treatment Plan for Secondary Diagnosis: Principal Problem:   MDD (major depressive disorder)  I certify that inpatient services furnished can reasonably be expected to improve the patient's condition.    Parks Ranger, DO 1/30/202410:23 AM

## 2022-09-25 NOTE — Evaluation (Signed)
Physical Therapy Evaluation Patient Details Name: [name] MRN: [id number] DOB: Nov 12, 1950 Today's Date: 09/25/2022  History of Present Illness  72 year old male initially admitted to behavioral health on 1/23 due to depression with suicidal ideations, then transferred to medical service due to possible hepatic encephalopathy, has since returned to [facility] psych unit. PMH significant for HTN, CHF, MDD, HLD, DM, throbocytopenia, liver cirrhosis  Clinical Impression  Pt pleasant and eager to work with PT.  He was easily able to rise and ambulate ~200 ft w/o AD and no need for assist, only superficial supervision.  Pt negotiated up/down flight of steps without assist, needing only expected single UE use on rails for support.  Pt with O2 remaining in the high 90s despite mild c/o fatigue.  Overall he appears close to his baseline and showed balance and strength appropriate for d/c, will not likely require further PT intervention her or at discharge.  Encouraged pt and staff to do more consistent bouts of ambulation in the unit per tolerance.  Pt reports he has a good plan in place to find a rental when he does leave.      Recommendations for follow up therapy are one component of a multi-disciplinary discharge planning process, led by the attending physician.  Recommendations may be updated based on patient status, additional functional criteria and insurance authorization.  Follow Up Recommendations Follow physician's recommendations for discharge plan and follow up therapies      Assistance Recommended at Discharge PRN  Patient can return home with the following  Assist for transportation    Equipment Recommendations None recommended by PT  Recommendations for Other Services       Functional Status Assessment Patient has had a recent decline in their functional status and demonstrates the ability to make significant improvements in function in a reasonable and predictable amount of time.      Precautions / Restrictions Precautions Precautions: Fall Precaution Comments: Seizure precautions Restrictions Weight Bearing Restrictions: No      Mobility  Bed Mobility Overal bed mobility: Modified Independent                  Transfers Overall transfer level: Modified independent Equipment used: None Transfers: Sit to/from Stand Sit to Stand: Modified independent (Device/Increase time)           General transfer comment: able to move safely and confidently with sit<>stand    Ambulation/Gait Ambulation/Gait assistance: Modified independent (Device/Increase time) Gait Distance (Feet): 200 Feet Assistive device: None         General Gait Details: Pt able to maintain consistent and confident cadence without AD, no safety concerns or assist needed.  Stairs Stairs: Yes Stairs assistance: Supervision Stair Management: One rail Left Number of Stairs: 12 General stair comments: Pt able to reciprocally ascend/descent steps with single UE use on rails, supervision only  Wheelchair Mobility    Modified Rankin (Stroke Patients Only)       Balance Overall balance assessment: Modified Independent   Sitting balance-Leahy Scale: Normal     Standing balance support: No upper extremity supported, During functional activity Standing balance-Leahy Scale: Good Standing balance comment: Pt able to maintain eyes closed balance w/ mod perturbations, 10 sec heel raises and >5 sec SLS.                             Pertinent Vitals/Pain Pain Assessment Pain Assessment: No/denies pain    Home Living Family/patient  expects to be discharged to:: Unsure     Type of Home: Homeless             Additional Comments: lost his home in the recent past (living in a tent since) but reports he has a list of rentals and is working on getting into one after a few days in a hotel at d/c.    Prior Function Prior Level of Function : Independent/Modified  Independent             Mobility Comments: will occasionally use a SPC, not reliant ADLs Comments: MOD I with ADL/IADL     Hand Dominance        Extremity/Trunk Assessment                Communication   Communication: No difficulties  Cognition Arousal/Alertness: Awake/alert Behavior During Therapy: WFL for tasks assessed/performed Overall Cognitive Status: Within Functional Limits for tasks assessed                   Orientation Level:  (knew it was late January, but not exact date, otherwise answered questions appropriately)                      General Comments General comments (skin integrity, edema, etc.): Pt did very well with mobility, gait, balance, etc and states that he feels close to his normal apart from being a little weaker just from being in the hospital.    Exercises     Assessment/Plan    PT Assessment Patient does not need any further PT services  PT Problem List Decreased strength;Decreased activity tolerance;Decreased balance;Decreased mobility;Pain       PT Treatment Interventions DME instruction;Gait training;Functional mobility training;Therapeutic activities;Therapeutic exercise;Balance training;Patient/family education    PT Goals (Current goals can be found in the Care Plan section)  Acute Rehab PT Goals Patient Stated Goal: get out by the weekend and find a place to rent PT Goal Formulation: All assessment and education complete, DC therapy    Frequency Min 2X/week     Co-evaluation               AM-PAC PT "6 Clicks" Mobility  Outcome Measure Help needed turning from your back to your side while in a flat bed without using bedrails?: None Help needed moving from lying on your back to sitting on the side of a flat bed without using bedrails?: None Help needed moving to and from a bed to a chair (including a wheelchair)?: None Help needed standing up from a chair using your arms (e.g., wheelchair or bedside  chair)?: None Help needed to walk in hospital room?: None Help needed climbing 3-5 steps with a railing? : None 6 Click Score: 24    End of Session   Activity Tolerance: Patient tolerated treatment well Patient left: in bed;with call bell/phone within reach Nurse Communication: Mobility status PT Visit Diagnosis: Unsteadiness on feet (R26.81);Muscle weakness (generalized) (M62.81)    Time: 9622-2979 PT Time Calculation (min) (ACUTE ONLY): 15 min   Charges:   PT Evaluation $PT Eval Low Complexity: 1 Low          Kreg Shropshire, DPT 09/25/2022, 11:34 AM

## 2022-09-25 NOTE — [facility] Group Notes (Signed)
Patient did not attend group discussion.

## 2022-09-25 NOTE — BHH Suicide Risk Assessment (Signed)
[facility] - Behavioral Health Services Admission Suicide Risk Assessment   Nursing information obtained from:  Patient Demographic factors:  Male, Age 72 or older, Caucasian, Unemployed, Living alone, Divorced or widowed, Low socioeconomic status Current Mental Status:  NA Loss Factors:  Financial problems / change in socioeconomic status Historical Factors:  NA Risk Reduction Factors:  Positive coping skills or problem solving skills  Total Time spent with patient: 1 hour Principal Problem: MDD (major depressive disorder) Diagnosis:  Principal Problem:   MDD (major depressive disorder)  Subjective Data:  [name] is a 72 year old white male who was voluntarily admitted to inpatient psychiatry for suicidal ideation.  He is homeless and lives in a tent in [place].  He tells me that he has PTSD from his brief training in the [employer] when he was 72 years old.  Apparently, someone was killed in front of him.  He is currently on disability.  He does have a Psychiatrist who lives in [place] but is also homeless.  He last had housing in 2018 but was evicted after the landlord was a fraud.  He sees Dr. Posey Pronto at the [facility] in [place].  He has a history of blindness, deaf, CHF, hypertension, liver and kidney disease, and type 2 diabetes.  He endorses anhedonia, difficulty sleeping, depressed mood, anxiety and hallucinations when he has flashbacks.  He was last psychiatrically hospitalized in the 1990s and does not have any past suicide attempts.  He currently does not have a psychiatrist.  He was seen in the emergency room recently for chest pain and a URI and given a prescription for Tessalon.  More recently seen by psychiatric nurse practitioner because he stated that he was going to cut his wrists.  He informs me that he does not do well on tricyclic antidepressants.   Continued Clinical Symptoms:  Alcohol Use Disorder Identification Test Final Score (AUDIT): 0 The "Alcohol Use Disorders Identification Test",  Guidelines for Use in Primary Care, Second Edition.  World Pharmacologist [facility]). Score between 0-7:  no or low risk or alcohol related problems. Score between 8-15:  moderate risk of alcohol related problems. Score between 16-19:  high risk of alcohol related problems. Score 20 or above:  warrants further diagnostic evaluation for alcohol dependence and treatment.   CLINICAL FACTORS:   Severe Anxiety and/or Agitation   Musculoskeletal: Strength & Muscle Tone: within normal limits Gait & Station: normal Patient leans: N/A  Psychiatric Specialty Exam:  Presentation  General Appearance:  Disheveled  Eye Contact: Good  Speech: Clear and Coherent  Speech Volume: Normal  Handedness: Right   Mood and Affect  Mood: Euphoric  Affect: Congruent   Thought Process  Thought Processes: Coherent  Descriptions of Associations:Intact  Orientation:Full (Time, Place and Person)  Thought Content:WDL  History of Schizophrenia/Schizoaffective disorder:No  Duration of Psychotic Symptoms:Less than six months  Hallucinations:No data recorded Ideas of Reference:None  Suicidal Thoughts:No data recorded Homicidal Thoughts:No data recorded  Sensorium  Memory: Immediate Good; Recent Good  Judgment: Fair  Insight: Poor   Executive Functions  Concentration: Good  Attention Span: Good  Recall: Good  Fund of Knowledge: Poor  Language: Fair   Psychomotor Activity  Psychomotor Activity:No data recorded  Assets  Assets: Financial Resources/Insurance; Resilience   Sleep  Sleep:No data recorded    Blood pressure (!) 156/64, pulse (!) 51, temperature 98.6 F (37 C), temperature source Oral, resp. rate 18, height '5\' 6"'$  (1.676 m), weight 111 kg, SpO2 100 %. Body mass index is 39.5 kg/m.  COGNITIVE FEATURES THAT CONTRIBUTE TO RISK:  None    SUICIDE RISK:   Minimal: No identifiable suicidal ideation.  Patients presenting with no risk factors  but with morbid ruminations; may be classified as minimal risk based on the severity of the depressive symptoms  PLAN OF CARE: See orders  I certify that inpatient services furnished can reasonably be expected to improve the patient's condition.   Parks Ranger, DO 09/25/2022, 10:21 AM

## 2022-09-26 MED ORDER — LOSARTAN POTASSIUM 25 MG PO TABS
50.0000 mg | ORAL_TABLET | Freq: Every day | ORAL | Status: DC
  Administered 2022-09-26 – 2022-10-04 (×8): 50 mg via ORAL
  Filled 2022-09-26 (×9): qty 2

## 2022-09-26 MED ORDER — DOXEPIN HCL 25 MG PO CAPS
25.0000 mg | ORAL_CAPSULE | Freq: Every day | ORAL | Status: DC
  Administered 2022-09-26 – 2022-09-27 (×2): 25 mg via ORAL
  Filled 2022-09-26 (×2): qty 1

## 2022-09-26 NOTE — Progress Notes (Signed)
   09/26/22 0847  Psych Admission Type (Psych Patients Only)  Admission Status Voluntary  Psychosocial Assessment  Patient Complaints None  Eye Contact Fair  Facial Expression Animated;Anxious  Affect Appropriate to circumstance  Speech Logical/coherent  Interaction Assertive  Motor Activity Slow  Appearance/Hygiene In scrubs  Behavior Characteristics Cooperative;Appropriate to situation  Mood Pleasant;Sullen  Thought Process  Coherency WDL  Content WDL  Delusions None reported or observed  Perception WDL  Hallucination None reported or observed  Judgment WDL  Confusion None  Danger to Self  Current suicidal ideation? Denies  Danger to Others  Danger to Others None reported or observed

## 2022-09-26 NOTE — Progress Notes (Signed)
[facility] MD Progress Note  09/26/2022 10:05 AM [name]  MRN:  [id number] Subjective: [name] is seen on rounds.  He has returned from medicine with the recent history of chronic liver disease and elevated ammonia.  He continues to take lactulose but complains of diarrhea.  He is alert and oriented x 3 and back to baseline but still depressed.  He is still complaining of having trouble sleeping.  He is pleasant and cooperative.  Social work is working on follow-up appointments.  He is currently homeless.  He is able to contract for safety in the hospital.  He does have a history of hepatitis.  Principal Problem: MDD (major depressive disorder) Diagnosis: Principal Problem:   MDD (major depressive disorder)  Total Time spent with patient: 15 minutes  Past Psychiatric History: History of homelessness.  He had been going to [name] in the past.  He is currently on the sex offenders list.  Past Medical History:  Past Medical History:  Diagnosis Date   Asthma    CHF (congestive heart failure) (H[facility])    COPD (chronic obstructive pulmonary disease) ([place])    COVID-19 03/2020   diagnosed in August 2021   Diabetes mellitus without complication ([place])    Homelessness    Hypertension    Infestation by bed bug    Migraine    Obesity    Sleep apnea     Past Surgical History:  Procedure Laterality Date   CARDIAC CATHETERIZATION     CHOLECYSTECTOMY     EYE SURGERY     INNER EAR SURGERY     NOSE SURGERY     Family History:  Family History  Problem Relation Age of Onset   Hypertension Mother    Heart disease Mother    Heart failure Maternal Grandmother    Family Psychiatric  History: Unremarkable Social History:  Social History   Substance and Sexual Activity  Alcohol Use Never     Social History   Substance and Sexual Activity  Drug Use Never    Social History   Socioeconomic History   Marital status: Single    Spouse name: Not on file   Number of children: 2   Years of  education: 12   Highest education level: 12th grade  Occupational History   Occupation: retired  Tobacco Use   Smoking status: Former   Smokeless tobacco: Never  Scientific laboratory technician Use: Not on file  Substance and Sexual Activity   Alcohol use: Never   Drug use: Never   Sexual activity: Never    Birth control/protection: None  Other Topics Concern   Not on file  Social History Narrative   Not on file   Social Determinants of Health   Financial Resource Strain: High Risk (04/27/2022)   Overall Financial Resource Strain (CARDIA)    Difficulty of Paying Living Expenses: Very hard  Food Insecurity: No Food Insecurity (09/24/2022)   Hunger Vital Sign    Worried About Running Out of Food in the Last Year: Never true    Ran Out of Food in the Last Year: Never true  Transportation Needs: No Transportation Needs (09/24/2022)   PRAPARE - Hydrologist (Medical): No    Lack of Transportation (Non-Medical): No  Physical Activity: Sufficiently Active (04/16/2018)   Exercise Vital Sign    Days of Exercise per Week: 7 days    Minutes of Exercise per Session: 30 min  Stress: Stress Concern Present (04/16/2018)  [employer] - Occupational Stress Questionnaire    Feeling of Stress : Very much  Social Connections: Moderately Isolated (04/30/2018)   Social Connection and Isolation Panel [NHANES]    Frequency of Communication with Friends and Family: Twice a week    Frequency of Social Gatherings with Friends and Family: Never    Attends Religious Services: More than 4 times per year    Active Member of [employer] or Organizations: No    Attends Archivist Meetings: Never    Marital Status: Widowed   Additional Social History:                         Sleep: Fair  Appetite:  Fair  Current Medications: Current Facility-Administered Medications  Medication Dose Route Frequency Provider Last Rate Last Admin    acetaminophen (TYLENOL) tablet 650 mg  650 mg Oral Q4H PRN Bennett, Christal H, NP       Or   acetaminophen (TYLENOL) 160 MG/5ML solution 650 mg  650 mg Per Tube Q4H PRN Bennett, Christal H, NP       Or   acetaminophen (TYLENOL) suppository 650 mg  650 mg Rectal Q4H PRN Bennett, Christal H, NP       albuterol (PROVENTIL) (2.5 MG/3ML) 0.083% nebulizer solution 2.5 mg  2.5 mg Nebulization Q4H PRN Bennett, Christal H, NP       aspirin suppository 300 mg  300 mg Rectal Daily Bennett, Christal H, NP       Or   aspirin tablet 325 mg  325 mg Oral Daily Bennett, Christal H, NP   325 mg at 09/25/22 1017   atorvastatin (LIPITOR) tablet 40 mg  40 mg Oral Daily Bennett, Christal H, NP   40 mg at 09/25/22 1017   citalopram (CELEXA) tablet 20 mg  20 mg Oral Daily Bennett, Christal H, NP   20 mg at 09/25/22 1017   dextromethorphan-guaiFENesin (MUCINEX DM) 30-600 MG per 12 hr tablet 1 tablet  1 tablet Oral BID PRN Bennett, Christal H, NP       enoxaparin (LOVENOX) injection 55 mg  0.5 mg/kg Subcutaneous Q24H Bennett, Christal H, NP   55 mg at 09/25/22 2114   hydrALAZINE (APRESOLINE) injection 5 mg  5 mg Intravenous Q2H PRN Bennett, Christal H, NP       hydrOXYzine (ATARAX) tablet 10 mg  10 mg Oral TID PRN Richardson Landry, Christal H, NP   10 mg at 09/25/22 2158   lactulose (CHRONULAC) 10 GM/15ML solution 30 g  30 g Oral BID Bennett, Christal H, NP   30 g at 09/25/22 2114   lisinopril (ZESTRIL) tablet 20 mg  20 mg Oral Daily Bennett, Christal H, NP   20 mg at 09/25/22 1017   nitroGLYCERIN (NITROSTAT) SL tablet 0.4 mg  0.4 mg Sublingual Q5 Min x 3 PRN Bennett, Christal H, NP       ondansetron (ZOFRAN) injection 4 mg  4 mg Intravenous Q8H PRN Bennett, Christal H, NP       pantoprazole (PROTONIX) EC tablet 20 mg  20 mg Oral Daily Bennett, Christal H, NP   20 mg at 09/25/22 1017   QUEtiapine (SEROQUEL) tablet 50 mg  50 mg Oral QHS Bennett, Christal H, NP   50 mg at 09/25/22 2113   senna-docusate (Senokot-S) tablet 1 tablet   1 tablet Oral QHS PRN Bennett, Christal H, NP       sodium chloride (OCEAN) 0.65 % nasal spray  1 spray  1 spray Each Nare PRN Dietrich Pates H, NP        Lab Results:  Results for orders placed or performed during the hospital encounter of 09/24/22 (from the past 48 hour(s))  Glucose, capillary     Status: None   Collection Time: 09/24/22  4:51 PM  Result Value Ref Range   Glucose-Capillary 89 70 - 99 mg/dL    Comment: Glucose reference range applies only to samples taken after fasting for at least 8 hours.  Glucose, capillary     Status: Abnormal   Collection Time: 09/24/22  8:21 PM  Result Value Ref Range   Glucose-Capillary 147 (H) 70 - 99 mg/dL    Comment: Glucose reference range applies only to samples taken after fasting for at least 8 hours.  Glucose, capillary     Status: None   Collection Time: 09/25/22  7:43 AM  Result Value Ref Range   Glucose-Capillary 75 70 - 99 mg/dL    Comment: Glucose reference range applies only to samples taken after fasting for at least 8 hours.  Glucose, capillary     Status: None   Collection Time: 09/25/22 11:55 AM  Result Value Ref Range   Glucose-Capillary 88 70 - 99 mg/dL    Comment: Glucose reference range applies only to samples taken after fasting for at least 8 hours.  Urinalysis, Complete w Microscopic -Urine, Clean Catch     Status: Abnormal   Collection Time: 09/25/22  8:32 PM  Result Value Ref Range   Color, Urine YELLOW (A) YELLOW   APPearance CLEAR (A) CLEAR   Specific Gravity, Urine 1.023 1.005 - 1.030   pH 5.0 5.0 - 8.0   Glucose, UA NEGATIVE NEGATIVE mg/dL   Hgb urine dipstick NEGATIVE NEGATIVE   Bilirubin Urine NEGATIVE NEGATIVE   Ketones, ur NEGATIVE NEGATIVE mg/dL   Protein, ur 30 (A) NEGATIVE mg/dL   Nitrite NEGATIVE NEGATIVE   Leukocytes,Ua NEGATIVE NEGATIVE   RBC / HPF 0-5 0 - 5 RBC/hpf   WBC, UA 0-5 0 - 5 WBC/hpf   Bacteria, UA NONE SEEN NONE SEEN   Squamous Epithelial / HPF 0-5 0 - 5 /HPF   Mucus PRESENT      Comment: Performed at [facility], [address] [place], [place] [address]    Blood Alcohol level:  Lab Results  Component Value Date   [facility] <10 09/15/2022   ETH <10 82/42/3536    Metabolic Disorder Labs: Lab Results  Component Value Date   HGBA1C 4.8 09/23/2022   MPG 91.06 09/23/2022   MPG 93.93 09/15/2022   No results found for: "PROLACTIN" Lab Results  Component Value Date   CHOL 140 09/23/2022   TRIG 41 09/23/2022   HDL 40 (L) 09/23/2022   CHOLHDL 3.5 09/23/2022   VLDL 8 09/23/2022   LDLCALC 92 09/23/2022   LDLCALC 57 04/29/2022    Physical Findings: AIMS:  , ,  ,  ,    CIWA:    COWS:     Musculoskeletal: Strength & Muscle Tone: within normal limits Gait & Station: normal Patient leans: N/A  Psychiatric Specialty Exam:  Presentation  General Appearance:  Disheveled  Eye Contact: Good  Speech: Clear and Coherent  Speech Volume: Normal  Handedness: Right   Mood and Affect  Mood: Euphoric  Affect: Congruent   Thought Process  Thought Processes: Coherent  Descriptions of Associations:Intact  Orientation:Full (Time, Place and Person)  Thought Content:WDL  History of Schizophrenia/Schizoaffective disorder:No  Duration of Psychotic Symptoms:Less than six months  Hallucinations:No data recorded Ideas of Reference:None  Suicidal Thoughts:No data recorded Homicidal Thoughts:No data recorded  Sensorium  Memory: Immediate Good; Recent Good  Judgment: Fair  Insight: Poor   Executive Functions  Concentration: Good  Attention Span: Good  Recall: Good  Fund of Knowledge: Poor  Language: Fair   Psychomotor Activity  Psychomotor Activity:No data recorded  Assets  Assets: Financial Resources/Insurance; Resilience   Sleep  Sleep:No data recorded   Physical Exam: Physical Exam Vitals and nursing note reviewed.  Constitutional:      Appearance: Normal appearance. He is normal weight.   Neurological:     General: No focal deficit present.     Mental Status: He is alert and oriented to person, place, and time.  Psychiatric:        Attention and Perception: Attention and perception normal.        Mood and Affect: Mood is depressed. Affect is flat.        Speech: Speech normal.        Behavior: Behavior normal. Behavior is cooperative.        Thought Content: Thought content normal.        Cognition and Memory: Cognition and memory normal.        Judgment: Judgment is impulsive and inappropriate.    Review of Systems  Constitutional: Negative.   HENT: Negative.    Eyes: Negative.   Respiratory: Negative.    Cardiovascular: Negative.   Gastrointestinal: Negative.   Genitourinary: Negative.   Musculoskeletal: Negative.   Skin: Negative.   Neurological: Negative.   Endo/Heme/Allergies: Negative.   Psychiatric/Behavioral: Negative.     Blood pressure (!) 148/67, pulse (!) 52, temperature 98.5 F (36.9 C), temperature source Oral, resp. rate 17, height '5\' 6"'$  (1.676 m), weight 111 kg, SpO2 100 %. Body mass index is 39.5 kg/m.   Treatment Plan Summary: Daily contact with patient to assess and evaluate symptoms and progress in treatment, Medication management, and Plan discontinue lisinopril and Senokot and start Cozaar.  [place], DO 09/26/2022, 10:05 AM

## 2022-09-26 NOTE — BH IP Treatment Plan (Signed)
Interdisciplinary Treatment and Diagnostic Plan Update  09/26/2022 Time of Session: 11:00AM [name] MRN: [id number]  Principal Diagnosis: MDD (major depressive disorder)  Secondary Diagnoses: Principal Problem:   MDD (major depressive disorder)   Current Medications:  Current Facility-Administered Medications  Medication Dose Route Frequency Provider Last Rate Last Admin   acetaminophen (TYLENOL) tablet 650 mg  650 mg Oral Q4H PRN Bennett, Christal H, NP       Or   acetaminophen (TYLENOL) 160 MG/5ML solution 650 mg  650 mg Per Tube Q4H PRN Bennett, Christal H, NP       Or   acetaminophen (TYLENOL) suppository 650 mg  650 mg Rectal Q4H PRN Bennett, Christal H, NP       albuterol (PROVENTIL) (2.5 MG/3ML) 0.083% nebulizer solution 2.5 mg  2.5 mg Nebulization Q4H PRN Bennett, Christal H, NP       aspirin suppository 300 mg  300 mg Rectal Daily Bennett, Christal H, NP       Or   aspirin tablet 325 mg  325 mg Oral Daily Bennett, Christal H, NP   325 mg at 09/26/22 1020   atorvastatin (LIPITOR) tablet 40 mg  40 mg Oral Daily Bennett, Christal H, NP   40 mg at 09/26/22 1020   citalopram (CELEXA) tablet 20 mg  20 mg Oral Daily Bennett, Christal H, NP   20 mg at 09/26/22 1020   dextromethorphan-guaiFENesin (MUCINEX DM) 30-600 MG per 12 hr tablet 1 tablet  1 tablet Oral BID PRN Bennett, Christal H, NP       doxepin (SINEQUAN) capsule 25 mg  25 mg Oral QHS Herrick, Richard Edward, DO       enoxaparin (LOVENOX) injection 55 mg  0.5 mg/kg Subcutaneous Q24H Bennett, Christal H, NP   55 mg at 09/25/22 2114   hydrALAZINE (APRESOLINE) injection 5 mg  5 mg Intravenous Q2H PRN Bennett, Christal H, NP       hydrOXYzine (ATARAX) tablet 10 mg  10 mg Oral TID PRN Richardson Landry, Christal H, NP   10 mg at 09/25/22 2158   lactulose (CHRONULAC) 10 GM/15ML solution 30 g  30 g Oral BID Bennett, Christal H, NP   30 g at 09/26/22 1020   losartan (COZAAR) tablet 50 mg  50 mg Oral QPC breakfast Parks Ranger, DO   50 mg at 09/26/22 1020   nitroGLYCERIN (NITROSTAT) SL tablet 0.4 mg  0.4 mg Sublingual Q5 Min x 3 PRN Bennett, Christal H, NP       ondansetron (ZOFRAN) injection 4 mg  4 mg Intravenous Q8H PRN Bennett, Christal H, NP       pantoprazole (PROTONIX) EC tablet 20 mg  20 mg Oral Daily Bennett, Christal H, NP   20 mg at 09/26/22 1020   QUEtiapine (SEROQUEL) tablet 50 mg  50 mg Oral QHS Bennett, Christal H, NP   50 mg at 09/25/22 2113   sodium chloride (OCEAN) 0.65 % nasal spray 1 spray  1 spray Each Nare PRN Bennett, Christal H, NP       PTA Medications: Medications Prior to Admission  Medication Sig Dispense Refill Last Dose   albuterol (PROVENTIL) (2.5 MG/3ML) 0.083% nebulizer solution Take 3 mLs by nebulization every 4 (four) hours as needed for wheezing or shortness of breath. (Patient not taking: Reported on 09/23/2022) 75 mL 0    albuterol (VENTOLIN HFA) 108 (90 Base) MCG/ACT inhaler Inhale 2 puffs into the lungs every 6 (six) hours as needed for wheezing or shortness  of breath. (Patient not taking: Reported on 09/17/2022) 1 each 5    ascorbic acid (VITAMIN C) 500 MG tablet Take 1 tablet (500 mg total) by mouth daily.      aspirin EC 81 MG tablet Take 1 tablet (81 mg total) by mouth daily. Swallow whole. 30 tablet 5    atorvastatin (LIPITOR) 40 MG tablet Take 1 tablet (40 mg total) by mouth daily. (Patient not taking: Reported on 09/15/2022) 30 tablet 5    citalopram (CELEXA) 20 MG tablet Take 1 tablet (20 mg total) by mouth daily. 30 tablet 3    furosemide (LASIX) 20 MG tablet Take 20 mg by mouth every morning.      glucosamine-chondroitin 500-400 MG tablet Take 1 tablet by mouth 3 (three) times daily.      hydrOXYzine (ATARAX) 10 MG tablet Take 10 mg by mouth 3 (three) times daily as needed.      JARDIANCE 10 MG TABS tablet Take 1 tablet (10 mg total) by mouth daily. 30 tablet 5    lactulose (CHRONULAC) 10 GM/15ML solution Take 45 mLs (30 g total) by mouth 2 (two) times daily. 236 mL  0    lansoprazole (PREVACID) 30 MG capsule Take 30 mg by mouth daily.      lisinopril (ZESTRIL) 40 MG tablet Take 1 tablet (40 mg total) by mouth daily. 30 tablet 5    metFORMIN (GLUCOPHAGE-XR) 500 MG 24 hr tablet Take 1 tablet (500 mg total) by mouth daily. 30 tablet 5    Multiple Vitamin (MULTIVITAMIN WITH MINERALS) TABS tablet Take 1 tablet by mouth daily.      nitroGLYCERIN (NITROSTAT) 0.4 MG SL tablet Place 1 tablet (0.4 mg total) under the tongue every 5 (five) minutes x 3 doses as needed for chest pain. 30 tablet 5    QUEtiapine (SEROQUEL) 50 MG tablet Take 1 tablet (50 mg total) by mouth at bedtime. 30 tablet 3     Patient Stressors: Financial difficulties   Health problems   Medication change or noncompliance   Other: homelessness    Patient Strengths: Ability for insight  Active sense of humor  Communication skills  Motivation for treatment/growth   Treatment Modalities: Medication Management, Group therapy, Case management,  1 to 1 session with clinician, Psychoeducation, Recreational therapy.   Physician Treatment Plan for Primary Diagnosis: MDD (major depressive disorder) Long Term Goal(s): Improvement in symptoms so as ready for discharge   Short Term Goals: Ability to identify changes in lifestyle to reduce recurrence of condition will improve Ability to verbalize feelings will improve Ability to disclose and discuss suicidal ideas Ability to demonstrate self-control will improve Ability to identify and develop effective coping behaviors will improve Ability to maintain clinical measurements within normal limits will improve Compliance with prescribed medications will improve Ability to identify triggers associated with substance abuse/mental health issues will improve  Medication Management: Evaluate patient's response, side effects, and tolerance of medication regimen.  Therapeutic Interventions: 1 to 1 sessions, Unit Group sessions and Medication  administration.  Evaluation of Outcomes: Progressing  Physician Treatment Plan for Secondary Diagnosis: Principal Problem:   MDD (major depressive disorder)  Long Term Goal(s): Improvement in symptoms so as ready for discharge   Short Term Goals: Ability to identify changes in lifestyle to reduce recurrence of condition will improve Ability to verbalize feelings will improve Ability to disclose and discuss suicidal ideas Ability to demonstrate self-control will improve Ability to identify and develop effective coping behaviors will improve Ability to maintain clinical  measurements within normal limits will improve Compliance with prescribed medications will improve Ability to identify triggers associated with substance abuse/mental health issues will improve     Medication Management: Evaluate patient's response, side effects, and tolerance of medication regimen.  Therapeutic Interventions: 1 to 1 sessions, Unit Group sessions and Medication administration.  Evaluation of Outcomes: Progressing   RN Treatment Plan for Primary Diagnosis: MDD (major depressive disorder) Long Term Goal(s): Knowledge of disease and therapeutic regimen to maintain health will improve  Short Term Goals: Ability to demonstrate self-control, Ability to participate in decision making will improve, Ability to verbalize feelings will improve, Ability to disclose and discuss suicidal ideas, Ability to identify and develop effective coping behaviors will improve, and Compliance with prescribed medications will improve  Medication Management: RN will administer medications as ordered by provider, will assess and evaluate patient's response and provide education to patient for prescribed medication. RN will report any adverse and/or side effects to prescribing provider.  Therapeutic Interventions: 1 on 1 counseling sessions, Psychoeducation, Medication administration, Evaluate responses to treatment, Monitor vital  signs and CBGs as ordered, Perform/monitor CIWA, COWS, AIMS and Fall Risk screenings as ordered, Perform wound care treatments as ordered.  Evaluation of Outcomes: Progressing   LCSW Treatment Plan for Primary Diagnosis: MDD (major depressive disorder) Long Term Goal(s): Safe transition to appropriate next level of care at discharge, Engage patient in therapeutic group addressing interpersonal concerns.  Short Term Goals: Engage patient in aftercare planning with referrals and resources, Increase social support, Increase ability to appropriately verbalize feelings, Increase emotional regulation, Facilitate acceptance of mental health diagnosis and concerns, and Increase skills for wellness and recovery  Therapeutic Interventions: Assess for all discharge needs, 1 to 1 time with Social worker, Explore available resources and support systems, Assess for adequacy in community support network, Educate family and significant other(s) on suicide prevention, Complete Psychosocial Assessment, Interpersonal group therapy.  Evaluation of Outcomes: Progressing   Progress in Treatment: Attending groups: No. Participating in groups: No. Taking medication as prescribed: Yes. Toleration medication: Yes. Family/Significant other contact made: Yes, individual(s) contacted:  SPE  completed with the patient's daughter Patient understands diagnosis: Yes. Discussing patient identified problems/goals with staff: Yes. Medical problems stabilized or resolved: Yes. Denies suicidal/homicidal ideation: Yes. Issues/concerns per patient self-inventory: No. Other: none  New problem(s) identified: No, Describe:  none  New Short Term/Long Term Goal(s): medication management for mood stabilization; elimination of SI thoughts; development of comprehensive mental wellness/sobriety plan.   Patient Goals:  "get my medicine adjusted and get stronger"  Discharge Plan or Barriers: Patient reports plans to return to his  tent or stay in a hotel at discharge.  He reports that he is looking forward to a referral for psychiatry and therapy at discharge.   Reason for Continuation of Hospitalization: Aggression Anxiety Depression Hallucinations Medication stabilization Suicidal ideation  Estimated Length of Stay:  1-7 days  Last 3 [place] Suicide Severity Risk Score: Flowsheet Row Admission (Current) from 09/24/2022 in [place] Admission (Discharged) from 09/17/2022 in [place] ED from 09/15/2022 in [facility] Department at [place] No Risk No Risk No Risk       Last PHQ 2/9 Scores:    04/26/2022   11:03 AM 04/26/2022   11:02 AM 01/30/2022   12:58 PM  Depression screen PHQ 2/9  Decreased Interest 3 0 0  Down, Depressed, Hopeless 3 1 0  PHQ - 2 Score 6 1 0  Altered  sleeping 3    Tired, decreased energy 3    Change in appetite 3    Feeling bad or failure about yourself  1    Trouble concentrating 2    Moving slowly or fidgety/restless 0    Suicidal thoughts 0    PHQ-9 Score 18    Difficult doing work/chores Somewhat difficult      Scribe for Treatment Team: Rozann Lesches, Marlinda Mike 09/26/2022 12:45 PM

## 2022-09-26 NOTE — Plan of Care (Signed)
  Problem: Education: Goal: Knowledge of General Education information will improve Description: Including pain rating scale, medication(s)/side effects and non-pharmacologic comfort measures Outcome: Progressing   Problem: Health Behavior/Discharge Planning: Goal: Ability to manage health-related needs will improve Outcome: Progressing   Problem: Clinical Measurements: Goal: Ability to maintain clinical measurements within normal limits will improve Outcome: Progressing Goal: Will remain free from infection Outcome: Progressing Goal: Diagnostic test results will improve Outcome: Progressing Goal: Respiratory complications will improve Outcome: Progressing Goal: Cardiovascular complication will be avoided Outcome: Progressing   Problem: Activity: Goal: Risk for activity intolerance will decrease Outcome: Progressing   Problem: Nutrition: Goal: Adequate nutrition will be maintained Outcome: Progressing   Problem: Coping: Goal: Level of anxiety will decrease Outcome: Progressing   Problem: Pain Managment: Goal: General experience of comfort will improve Outcome: Progressing   Problem: Elimination: Goal: Will not experience complications related to bowel motility Outcome: Progressing Goal: Will not experience complications related to urinary retention Outcome: Progressing   Problem: Safety: Goal: Ability to remain free from injury will improve Outcome: Progressing   Problem: Skin Integrity: Goal: Risk for impaired skin integrity will decrease Outcome: Progressing   Problem: Nutrition: Goal: Risk of aspiration will decrease Outcome: Progressing Goal: Dietary intake will improve Outcome: Progressing   Problem: Self-Care: Goal: Ability to participate in self-care as condition permits will improve Outcome: Progressing Goal: Verbalization of feelings and concerns over difficulty with self-care will improve Outcome: Progressing Goal: Ability to communicate needs  accurately will improve Outcome: Progressing   Problem: Health Behavior/Discharge Planning: Goal: Ability to manage health-related needs will improve Outcome: Progressing Goal: Goals will be collaboratively established with patient/family Outcome: Progressing

## 2022-09-26 NOTE — [facility] Group Notes (Signed)
Patient attended group and engaged in group discussion

## 2022-09-26 NOTE — Group Note (Signed)
[facility] LCSW Group Therapy Note   Group Date: 09/26/2022 Start Time: 1330 End Time: 1430   Type of Therapy/Topic:  Group Therapy:  Balance in Life  Participation Level:  Active   Description of Group:    This group will address the concept of balance and how it feels and looks when one is unbalanced. Patients will be encouraged to process areas in their lives that are out of balance, and identify reasons for remaining unbalanced. Facilitators will guide patients utilizing problem- solving interventions to address and correct the stressor making their life unbalanced. Understanding and applying boundaries will be explored and addressed for obtaining  and maintaining a balanced life. Patients will be encouraged to explore ways to assertively make their unbalanced needs known to significant others in their lives, using other group members and facilitator for support and feedback.  Therapeutic Goals: Patient will identify two or more emotions or situations they have that consume much of in their lives. Patient will identify signs/triggers that life has become out of balance:  Patient will identify two ways to set boundaries in order to achieve balance in their lives:  Patient will demonstrate ability to communicate their needs through discussion and/or role plays  Summary of Patient Progress: Patient was present in group.  Patient was appropriate in group and supportive of others.  Patient engaged in discussion on self-care topics and was able to identify things that he does.    Therapeutic Modalities:   Cognitive Behavioral Therapy Solution-Focused Therapy Assertiveness Training   Rozann Lesches, LCSW

## 2022-09-26 NOTE — Progress Notes (Signed)
Patient is alert and oriented times 4. Mood and affect sad/sullen. Patient denies pain. He denies SI, HI, and AVH. Also denies feelings of anxiety and depression at this time. States he slept good last night. Morning meds given whole by mouth W/O difficulty. Ate breakfast in day room- appetite good. Patient remains on unit with Q15 minute checks in place.

## 2022-09-26 NOTE — Progress Notes (Signed)
Recreation Therapy  09/26/2022         Time: 1300      Group Topic/Focus: Reminisce/Music Therapy   Participation Level: Did not attend    [name] E Everlene Cunning LRT/CTRS  09/26/2022 2:20 PM

## 2022-09-26 NOTE — Progress Notes (Signed)
[facility] MD Progress Note  09/26/2022 12:20 PM [name]  MRN:  [id number] Subjective: Patient is seen on rounds.  He is complaining of not sleeping at night.  I discussed starting doxepin with him.  He states he has been on it before and it did help.  He still feels depressed but is able to contract for safety in the hospital.  He is still on lactulose for liver problems. Principal Problem: MDD (major depressive disorder) Diagnosis: Principal Problem:   MDD (major depressive disorder)  Total Time spent with patient: 15 minutes  Past Psychiatric History: Past history at [facility]  Past Medical History:  Past Medical History:  Diagnosis Date   Asthma    CHF (congestive heart failure) (HCC)    COPD (chronic obstructive pulmonary disease) ([place])    COVID-19 03/2020   diagnosed in August 2021   Diabetes mellitus without complication ([place])    Homelessness    Hypertension    Infestation by bed bug    Migraine    Obesity    Sleep apnea     Past Surgical History:  Procedure Laterality Date   CARDIAC CATHETERIZATION     CHOLECYSTECTOMY     EYE SURGERY     INNER EAR SURGERY     NOSE SURGERY     Family History:  Family History  Problem Relation Age of Onset   Hypertension Mother    Heart disease Mother    Heart failure Maternal Grandmother    Family Psychiatric  History: Unremarkable Social History:  Social History   Substance and Sexual Activity  Alcohol Use Never     Social History   Substance and Sexual Activity  Drug Use Never    Social History   Socioeconomic History   Marital status: Single    Spouse name: Not on file   Number of children: 2   Years of education: 12   Highest education level: 12th grade  Occupational History   Occupation: retired  Tobacco Use   Smoking status: Former   Smokeless tobacco: Never  Scientific laboratory technician Use: Not on file  Substance and Sexual Activity   Alcohol use: Never   Drug use: Never   Sexual activity: Never    Birth  control/protection: None  Other Topics Concern   Not on file  Social History Narrative   Not on file   Social Determinants of Health   Financial Resource Strain: High Risk (04/27/2022)   Overall Financial Resource Strain (CARDIA)    Difficulty of Paying Living Expenses: Very hard  Food Insecurity: No Food Insecurity (09/24/2022)   Hunger Vital Sign    Worried About Running Out of Food in the Last Year: Never true    Ran Out of Food in the Last Year: Never true  Transportation Needs: No Transportation Needs (09/24/2022)   PRAPARE - Hydrologist (Medical): No    Lack of Transportation (Non-Medical): No  Physical Activity: Sufficiently Active (04/16/2018)   Exercise Vital Sign    Days of Exercise per Week: 7 days    Minutes of Exercise per Session: 30 min  Stress: Stress Concern Present (04/16/2018)   [place]    Feeling of Stress : Very much  Social Connections: Moderately Isolated (04/30/2018)   Social Connection and Isolation Panel [NHANES]    Frequency of Communication with Friends and Family: Twice a week    Frequency of Social Gatherings with Friends  and Family: Never    Attends Religious Services: More than 4 times per year    Active Member of Clubs or Organizations: No    Attends Archivist Meetings: Never    Marital Status: Widowed   Additional Social History:                         Sleep: Poor  Appetite:  Fair  Current Medications: Current Facility-Administered Medications  Medication Dose Route Frequency Provider Last Rate Last Admin   acetaminophen (TYLENOL) tablet 650 mg  650 mg Oral Q4H PRN Bennett, Christal H, NP       Or   acetaminophen (TYLENOL) 160 MG/5ML solution 650 mg  650 mg Per Tube Q4H PRN Bennett, Christal H, NP       Or   acetaminophen (TYLENOL) suppository 650 mg  650 mg Rectal Q4H PRN Bennett, Christal H, NP       albuterol (PROVENTIL)  (2.5 MG/3ML) 0.083% nebulizer solution 2.5 mg  2.5 mg Nebulization Q4H PRN Bennett, Christal H, NP       aspirin suppository 300 mg  300 mg Rectal Daily Bennett, Christal H, NP       Or   aspirin tablet 325 mg  325 mg Oral Daily Bennett, Christal H, NP   325 mg at 09/26/22 1020   atorvastatin (LIPITOR) tablet 40 mg  40 mg Oral Daily Bennett, Christal H, NP   40 mg at 09/26/22 1020   citalopram (CELEXA) tablet 20 mg  20 mg Oral Daily Bennett, Christal H, NP   20 mg at 09/26/22 1020   dextromethorphan-guaiFENesin (MUCINEX DM) 30-600 MG per 12 hr tablet 1 tablet  1 tablet Oral BID PRN Bennett, Christal H, NP       enoxaparin (LOVENOX) injection 55 mg  0.5 mg/kg Subcutaneous Q24H Bennett, Christal H, NP   55 mg at 09/25/22 2114   hydrALAZINE (APRESOLINE) injection 5 mg  5 mg Intravenous Q2H PRN Bennett, Christal H, NP       hydrOXYzine (ATARAX) tablet 10 mg  10 mg Oral TID PRN Richardson Landry, Christal H, NP   10 mg at 09/25/22 2158   lactulose (CHRONULAC) 10 GM/15ML solution 30 g  30 g Oral BID Bennett, Christal H, NP   30 g at 09/26/22 1020   losartan (COZAAR) tablet 50 mg  50 mg Oral QPC breakfast Parks Ranger, DO   50 mg at 09/26/22 1020   nitroGLYCERIN (NITROSTAT) SL tablet 0.4 mg  0.4 mg Sublingual Q5 Min x 3 PRN Bennett, Christal H, NP       ondansetron (ZOFRAN) injection 4 mg  4 mg Intravenous Q8H PRN Bennett, Christal H, NP       pantoprazole (PROTONIX) EC tablet 20 mg  20 mg Oral Daily Bennett, Christal H, NP   20 mg at 09/26/22 1020   QUEtiapine (SEROQUEL) tablet 50 mg  50 mg Oral QHS Bennett, Christal H, NP   50 mg at 09/25/22 2113   sodium chloride (OCEAN) 0.65 % nasal spray 1 spray  1 spray Each Nare PRN Dietrich Pates H, NP        Lab Results:  Results for orders placed or performed during the hospital encounter of 09/24/22 (from the past 48 hour(s))  Glucose, capillary     Status: None   Collection Time: 09/24/22  4:51 PM  Result Value Ref Range   Glucose-Capillary 89 70 -  99 mg/dL    Comment: Glucose  reference range applies only to samples taken after fasting for at least 8 hours.  Glucose, capillary     Status: Abnormal   Collection Time: 09/24/22  8:21 PM  Result Value Ref Range   Glucose-Capillary 147 (H) 70 - 99 mg/dL    Comment: Glucose reference range applies only to samples taken after fasting for at least 8 hours.  Glucose, capillary     Status: None   Collection Time: 09/25/22  7:43 AM  Result Value Ref Range   Glucose-Capillary 75 70 - 99 mg/dL    Comment: Glucose reference range applies only to samples taken after fasting for at least 8 hours.  Glucose, capillary     Status: None   Collection Time: 09/25/22 11:55 AM  Result Value Ref Range   Glucose-Capillary 88 70 - 99 mg/dL    Comment: Glucose reference range applies only to samples taken after fasting for at least 8 hours.  Urinalysis, Complete w Microscopic -Urine, Clean Catch     Status: Abnormal   Collection Time: 09/25/22  8:32 PM  Result Value Ref Range   Color, Urine YELLOW (A) YELLOW   APPearance CLEAR (A) CLEAR   Specific Gravity, Urine 1.023 1.005 - 1.030   pH 5.0 5.0 - 8.0   Glucose, UA NEGATIVE NEGATIVE mg/dL   Hgb urine dipstick NEGATIVE NEGATIVE   Bilirubin Urine NEGATIVE NEGATIVE   Ketones, ur NEGATIVE NEGATIVE mg/dL   Protein, ur 30 (A) NEGATIVE mg/dL   Nitrite NEGATIVE NEGATIVE   Leukocytes,Ua NEGATIVE NEGATIVE   RBC / HPF 0-5 0 - 5 RBC/hpf   WBC, UA 0-5 0 - 5 WBC/hpf   Bacteria, UA NONE SEEN NONE SEEN   Squamous Epithelial / HPF 0-5 0 - 5 /HPF   Mucus PRESENT     Comment: Performed at [facility], [address] [place], [place] [address]    Blood Alcohol level:  Lab Results  Component Value Date   [facility] <10 09/15/2022   ETH <10 75/91/6384    Metabolic Disorder Labs: Lab Results  Component Value Date   HGBA1C 4.8 09/23/2022   MPG 91.06 09/23/2022   MPG 93.93 09/15/2022   No results found for: "PROLACTIN" Lab Results  Component Value Date    CHOL 140 09/23/2022   TRIG 41 09/23/2022   HDL 40 (L) 09/23/2022   CHOLHDL 3.5 09/23/2022   VLDL 8 09/23/2022   LDLCALC 92 09/23/2022   LDLCALC 57 04/29/2022    Physical Findings: AIMS:  , ,  ,  ,    CIWA:    COWS:     Musculoskeletal: Strength & Muscle Tone: within normal limits Gait & Station: normal Patient leans: N/A  Psychiatric Specialty Exam:  Presentation  General Appearance:  Disheveled  Eye Contact: Good  Speech: Clear and Coherent  Speech Volume: Normal  Handedness: Right   Mood and Affect  Mood: Euphoric  Affect: Congruent   Thought Process  Thought Processes: Coherent  Descriptions of Associations:Intact  Orientation:Full (Time, Place and Person)  Thought Content:WDL  History of Schizophrenia/Schizoaffective disorder:No  Duration of Psychotic Symptoms:Less than six months  Hallucinations:No data recorded Ideas of Reference:None  Suicidal Thoughts:No data recorded Homicidal Thoughts:No data recorded  Sensorium  Memory: Immediate Good; Recent Good  Judgment: Fair  Insight: Poor   Executive Functions  Concentration: Good  Attention Span: Good  Recall: Good  Fund of Knowledge: Poor  Language: Fair   Psychomotor Activity  Psychomotor Activity:No data recorded  Assets  Assets: Financial Resources/Insurance;  Resilience   Sleep  Sleep:No data recorded   Physical Exam: Physical Exam Vitals and nursing note reviewed.  Constitutional:      Appearance: Normal appearance. He is normal weight.  Neurological:     General: No focal deficit present.     Mental Status: He is alert and oriented to person, place, and time.  Psychiatric:        Attention and Perception: Attention and perception normal.        Mood and Affect: Mood is depressed. Affect is flat.        Speech: Speech normal.        Behavior: Behavior normal. Behavior is cooperative.        Thought Content: Thought content normal.         Cognition and Memory: Cognition and memory normal.        Judgment: Judgment is impulsive and inappropriate.    Review of Systems  Constitutional: Negative.   HENT: Negative.    Eyes: Negative.   Respiratory: Negative.    Cardiovascular: Negative.   Gastrointestinal: Negative.   Genitourinary: Negative.   Musculoskeletal: Negative.   Skin: Negative.   Neurological: Negative.   Endo/Heme/Allergies: Negative.   Psychiatric/Behavioral:  Positive for depression. The patient has insomnia.    Blood pressure (!) 148/67, pulse (!) 52, temperature 98.5 F (36.9 C), temperature source Oral, resp. rate 17, height '5\' 6"'$  (1.676 m), weight 111 kg, SpO2 100 %. Body mass index is 39.5 kg/m.   Treatment Plan Summary: Daily contact with patient to assess and evaluate symptoms and progress in treatment, Medication management, and Plan start doxepin 25 mg at bedtime.  Parks Ranger, DO 09/26/2022, 12:20 PM

## 2022-09-27 LAB — GLUCOSE, CAPILLARY: Glucose-Capillary: 108 mg/dL — ABNORMAL HIGH (ref 70–99)

## 2022-09-27 NOTE — Progress Notes (Signed)
   09/27/22 0745  Psych Admission Type (Psych Patients Only)  Admission Status Voluntary  Psychosocial Assessment  Patient Complaints None  Eye Contact Fair  Facial Expression Animated;Anxious  Affect Appropriate to circumstance  Speech Logical/coherent  Interaction Assertive  Motor Activity Slow  Appearance/Hygiene In scrubs  Behavior Characteristics Cooperative  Mood Pleasant  Thought Process  Coherency WDL  Content WDL  Delusions None reported or observed  Perception WDL  Hallucination None reported or observed  Judgment WDL  Confusion None  Danger to Self  Current suicidal ideation? Denies  Danger to Others  Danger to Others None reported or observed

## 2022-09-27 NOTE — Progress Notes (Signed)
[facility] MD Progress Note  09/27/2022 11:55 AM [name]  MRN:  [id number] Subjective: [name] is seen on rounds.  Social work continues to look for outpatient follow-up.  [employer] has denied to him.  He has been compliant with medications.  No side effects.  He is still on lactulose for his liver.  His last ammonia level 128 was 47.  He is able contract for safety in the hospital. Principal Problem: MDD (major depressive disorder) Diagnosis: Principal Problem:   MDD (major depressive disorder)  Total Time spent with patient: 15 minutes  Past Psychiatric History: Not too extensive  Past Medical History:  Past Medical History:  Diagnosis Date   Asthma    CHF (congestive heart failure) (HCC)    COPD (chronic obstructive pulmonary disease) ([place])    COVID-19 03/2020   diagnosed in August 2021   Diabetes mellitus without complication ([place])    Homelessness    Hypertension    Infestation by bed bug    Migraine    Obesity    Sleep apnea     Past Surgical History:  Procedure Laterality Date   CARDIAC CATHETERIZATION     CHOLECYSTECTOMY     EYE SURGERY     INNER EAR SURGERY     NOSE SURGERY     Family History:  Family History  Problem Relation Age of Onset   Hypertension Mother    Heart disease Mother    Heart failure Maternal Grandmother    Family Psychiatric  History: Unremarkable Social History:  Social History   Substance and Sexual Activity  Alcohol Use Never     Social History   Substance and Sexual Activity  Drug Use Never    Social History   Socioeconomic History   Marital status: Single    Spouse name: Not on file   Number of children: 2   Years of education: 12   Highest education level: 12th grade  Occupational History   Occupation: retired  Tobacco Use   Smoking status: Former   Smokeless tobacco: Never  Scientific laboratory technician Use: Not on file  Substance and Sexual Activity   Alcohol use: Never   Drug use: Never   Sexual activity: Never    Birth  control/protection: None  Other Topics Concern   Not on file  Social History Narrative   Not on file   Social Determinants of Health   Financial Resource Strain: High Risk (04/27/2022)   Overall Financial Resource Strain (CARDIA)    Difficulty of Paying Living Expenses: Very hard  Food Insecurity: No Food Insecurity (09/24/2022)   Hunger Vital Sign    Worried About Running Out of Food in the Last Year: Never true    Ran Out of Food in the Last Year: Never true  Transportation Needs: No Transportation Needs (09/24/2022)   PRAPARE - Hydrologist (Medical): No    Lack of Transportation (Non-Medical): No  Physical Activity: Sufficiently Active (04/16/2018)   Exercise Vital Sign    Days of Exercise per Week: 7 days    Minutes of Exercise per Session: 30 min  Stress: Stress Concern Present (04/16/2018)   [place]    Feeling of Stress : Very much  Social Connections: Moderately Isolated (04/30/2018)   Social Connection and Isolation Panel [NHANES]    Frequency of Communication with Friends and Family: Twice a week    Frequency of Social Gatherings with Friends and  Family: Never    Attends Religious Services: More than 4 times per year    Active Member of Clubs or Organizations: No    Attends Archivist Meetings: Never    Marital Status: Widowed   Additional Social History:                         Sleep: Good  Appetite:  Good  Current Medications: Current Facility-Administered Medications  Medication Dose Route Frequency Provider Last Rate Last Admin   acetaminophen (TYLENOL) tablet 650 mg  650 mg Oral Q4H PRN Bennett, Christal H, NP       Or   acetaminophen (TYLENOL) 160 MG/5ML solution 650 mg  650 mg Per Tube Q4H PRN Bennett, Christal H, NP       Or   acetaminophen (TYLENOL) suppository 650 mg  650 mg Rectal Q4H PRN Bennett, Christal H, NP       albuterol (PROVENTIL)  (2.5 MG/3ML) 0.083% nebulizer solution 2.5 mg  2.5 mg Nebulization Q4H PRN Bennett, Christal H, NP       aspirin suppository 300 mg  300 mg Rectal Daily Bennett, Christal H, NP       Or   aspirin tablet 325 mg  325 mg Oral Daily Bennett, Christal H, NP   325 mg at 09/27/22 0908   atorvastatin (LIPITOR) tablet 40 mg  40 mg Oral Daily Bennett, Christal H, NP   40 mg at 09/27/22 0907   citalopram (CELEXA) tablet 20 mg  20 mg Oral Daily Bennett, Christal H, NP   20 mg at 09/27/22 0908   dextromethorphan-guaiFENesin ([place] DM) 30-600 MG per 12 hr tablet 1 tablet  1 tablet Oral BID PRN Richardson Landry, Christal H, NP       doxepin (SINEQUAN) capsule 25 mg  25 mg Oral QHS Parks Ranger, DO   25 mg at 09/26/22 2114   enoxaparin (LOVENOX) injection 55 mg  0.5 mg/kg Subcutaneous Q24H Bennett, Christal H, NP   55 mg at 09/26/22 2100   hydrALAZINE (APRESOLINE) injection 5 mg  5 mg Intravenous Q2H PRN Bennett, Christal H, NP       hydrOXYzine (ATARAX) tablet 10 mg  10 mg Oral TID PRN Richardson Landry, Christal H, NP   10 mg at 09/25/22 2158   lactulose (CHRONULAC) 10 GM/15ML solution 30 g  30 g Oral BID Bennett, Christal H, NP   30 g at 09/27/22 0908   losartan (COZAAR) tablet 50 mg  50 mg Oral QPC breakfast Parks Ranger, DO   50 mg at 09/27/22 0908   nitroGLYCERIN (NITROSTAT) SL tablet 0.4 mg  0.4 mg Sublingual Q5 Min x 3 PRN Bennett, Christal H, NP       ondansetron (ZOFRAN) injection 4 mg  4 mg Intravenous Q8H PRN Bennett, Christal H, NP       pantoprazole (PROTONIX) EC tablet 20 mg  20 mg Oral Daily Bennett, Christal H, NP   20 mg at 09/27/22 0916   QUEtiapine (SEROQUEL) tablet 50 mg  50 mg Oral QHS Bennett, Christal H, NP   50 mg at 09/26/22 2109   sodium chloride (OCEAN) 0.65 % nasal spray 1 spray  1 spray Each Nare PRN Dietrich Pates H, NP        Lab Results:  Results for orders placed or performed during the hospital encounter of 09/24/22 (from the past 48 hour(s))  Urinalysis, Complete w  Microscopic -Urine, Clean Catch     Status:  Abnormal   Collection Time: 09/25/22  8:32 PM  Result Value Ref Range   Color, Urine YELLOW (A) YELLOW   APPearance CLEAR (A) CLEAR   Specific Gravity, Urine 1.023 1.005 - 1.030   pH 5.0 5.0 - 8.0   Glucose, UA NEGATIVE NEGATIVE mg/dL   Hgb urine dipstick NEGATIVE NEGATIVE   Bilirubin Urine NEGATIVE NEGATIVE   Ketones, ur NEGATIVE NEGATIVE mg/dL   Protein, ur 30 (A) NEGATIVE mg/dL   Nitrite NEGATIVE NEGATIVE   Leukocytes,Ua NEGATIVE NEGATIVE   RBC / HPF 0-5 0 - 5 RBC/hpf   WBC, UA 0-5 0 - 5 WBC/hpf   Bacteria, UA NONE SEEN NONE SEEN   Squamous Epithelial / HPF 0-5 0 - 5 /HPF   Mucus PRESENT     Comment: Performed at [facility], [address] [place], [place] [address]  Glucose, capillary     Status: Abnormal   Collection Time: 09/27/22  7:45 AM  Result Value Ref Range   Glucose-Capillary 108 (H) 70 - 99 mg/dL    Comment: Glucose reference range applies only to samples taken after fasting for at least 8 hours.    Blood Alcohol level:  Lab Results  Component Value Date   ETH <10 09/15/2022   ETH <10 82/42/3536    Metabolic Disorder Labs: Lab Results  Component Value Date   HGBA1C 4.8 09/23/2022   MPG 91.06 09/23/2022   MPG 93.93 09/15/2022   No results found for: "PROLACTIN" Lab Results  Component Value Date   CHOL 140 09/23/2022   TRIG 41 09/23/2022   HDL 40 (L) 09/23/2022   CHOLHDL 3.5 09/23/2022   VLDL 8 09/23/2022   LDLCALC 92 09/23/2022   LDLCALC 57 04/29/2022    Physical Findings: AIMS:  , ,  ,  ,    CIWA:    COWS:     Musculoskeletal: Strength & Muscle Tone: within normal limits Gait & Station: normal Patient leans: N/A  Psychiatric Specialty Exam:  Presentation  General Appearance:  Disheveled  Eye Contact: Good  Speech: Clear and Coherent  Speech Volume: Normal  Handedness: Right   Mood and Affect  Mood: Euphoric  Affect: Congruent   Thought Process  Thought  Processes: Coherent  Descriptions of Associations:Intact  Orientation:Full (Time, Place and Person)  Thought Content:WDL  History of Schizophrenia/Schizoaffective disorder:No  Duration of Psychotic Symptoms:Less than six months  Hallucinations:No data recorded Ideas of Reference:None  Suicidal Thoughts:No data recorded Homicidal Thoughts:No data recorded  Sensorium  Memory: Immediate Good; Recent Good  Judgment: Fair  Insight: Poor   Executive Functions  Concentration: Good  Attention Span: Good  Recall: Good  Fund of Knowledge: Poor  Language: Fair   Psychomotor Activity  Psychomotor Activity:No data recorded  Assets  Assets: Financial Resources/Insurance; Resilience   Sleep  Sleep:No data recorded   Physical Exam: Physical Exam Vitals and nursing note reviewed.  Constitutional:      Appearance: Normal appearance. He is normal weight.  Neurological:     General: No focal deficit present.     Mental Status: He is alert and oriented to person, place, and time.  Psychiatric:        Attention and Perception: Attention and perception normal.        Mood and Affect: Mood is depressed. Affect is flat.        Speech: Speech normal.        Behavior: Behavior normal. Behavior is cooperative.        Thought Content:  Thought content normal.        Cognition and Memory: Cognition and memory normal.        Judgment: Judgment is impulsive.    Review of Systems  Constitutional: Negative.   HENT: Negative.    Eyes: Negative.   Respiratory: Negative.    Cardiovascular: Negative.   Gastrointestinal: Negative.   Genitourinary: Negative.   Musculoskeletal: Negative.   Skin: Negative.   Neurological: Negative.   Endo/Heme/Allergies: Negative.   Psychiatric/Behavioral:  Positive for depression.    Blood pressure (!) 149/79, pulse (!) 54, temperature (!) 97.5 F (36.4 C), resp. rate 18, height '5\' 6"'$  (1.676 m), weight 111 kg, SpO2 100 %. Body mass  index is 39.5 kg/m.   Treatment Plan Summary: Daily contact with patient to assess and evaluate symptoms and progress in treatment, Medication management, and Plan continue current medications.  Consider increasing doxepin.  Parks Ranger, DO 09/27/2022, 11:55 AM

## 2022-09-27 NOTE — [facility] Group Notes (Signed)
09/27/2022         Time: 1400      Group Topic/Focus: Expressive Arts- Painting a Peaceful Place   Participation Level: Did not attend   Additional Comments: Patient did not attend group, despite much encouragement from this Probation officer.    Vilma Prader LRT/CTRS  09/27/2022 2:54 PM

## 2022-09-27 NOTE — Plan of Care (Signed)
  Problem: Education: Goal: Knowledge of General Education information will improve Description: Including pain rating scale, medication(s)/side effects and non-pharmacologic comfort measures Outcome: Progressing   Problem: Health Behavior/Discharge Planning: Goal: Ability to manage health-related needs will improve Outcome: Progressing    Problem: Clinical Measurements: Goal: Ability to maintain clinical measurements within normal limits will improve Outcome: Progressing Goal: Will remain free from infection Outcome: Progressing Goal: Diagnostic test results will improve Outcome: Progressing Goal: Respiratory complications will improve Outcome: Progressing Goal: Cardiovascular complication will be avoided Outcome: Progressing   Problem: Activity: Goal: Risk for activity intolerance will decrease Outcome: Progressing   Problem: Nutrition: Goal: Adequate nutrition will be maintained Outcome: Progressing   Problem: Coping: Goal: Level of anxiety will decrease Outcome: Progressing   Problem: Elimination: Goal: Will not experience complications related to bowel motility Outcome: Progressing Goal: Will not experience complications related to urinary retention Outcome: Progressing   Problem: Pain Managment: Goal: General experience of comfort will improve Outcome: Progressing   Problem: Safety: Goal: Ability to remain free from injury will improve Outcome: Progressing   Problem: Skin Integrity: Goal: Risk for impaired skin integrity will decrease Outcome: Progressing   Problem: Nutrition: Goal: Risk of aspiration will decrease Outcome: Progressing Goal: Dietary intake will improve Outcome: Progressing   Problem: Self-Care: Goal: Ability to participate in self-care as condition permits will improve Outcome: Progressing Goal: Verbalization of feelings and concerns over difficulty with self-care will improve Outcome: Progressing Goal: Ability to communicate needs  accurately will improve Outcome: Progressing

## 2022-09-27 NOTE — Progress Notes (Signed)
Patient is alert and oriented times 4. Mood and affect sad/sullen. Patient denies pain. He denies SI, HI, and AVH. Also denies feelings of anxiety and depression at this time. States he slept good last night. Morning meds given whole by mouth W/O difficulty. Ate breakfast in day room- appetite good. Patient remains on unit with Q15 minute checks in place.

## 2022-09-28 LAB — GLUCOSE, CAPILLARY: Glucose-Capillary: 99 mg/dL (ref 70–99)

## 2022-09-28 MED ORDER — DOXEPIN HCL 50 MG PO CAPS
50.0000 mg | ORAL_CAPSULE | Freq: Every day | ORAL | Status: DC
  Administered 2022-09-28 – 2022-10-03 (×6): 50 mg via ORAL
  Filled 2022-09-28 (×6): qty 1

## 2022-09-28 NOTE — Plan of Care (Signed)
Patient was calm and cooperative throughout shift.  A&O x 4.  No complaints of physical or mental distress.  Denies SI HI AVH.  Taking medications as prescribed.  Continuing to monitor via q 15 minute checks.

## 2022-09-28 NOTE — Group Note (Signed)
LCSW Group Therapy Note  Group Date: 09/28/2022 Start Time: 1330 End Time: 1430   Type of Therapy and Topic:  Group Therapy - Healthy vs Unhealthy Coping Skills  Participation Level:  Did Not Attend   Description of Group The focus of this group was to determine what unhealthy coping techniques typically are used by group members and what healthy coping techniques would be helpful in coping with various problems. Patients were guided in becoming aware of the differences between healthy and unhealthy coping techniques. Patients were asked to identify 2-3 healthy coping skills they would like to learn to use more effectively.  Therapeutic Goals Patients learned that coping is what human beings do all day long to deal with various situations in their lives Patients defined and discussed healthy vs unhealthy coping techniques Patients identified their preferred coping techniques and identified whether these were healthy or unhealthy Patients determined 2-3 healthy coping skills they would like to become more familiar with and use more often. Patients provided support and ideas to each other   Summary of Patient Progress:   X  Therapeutic Modalities Cognitive Behavioral Therapy Motivational Interviewing  Rozann Lesches, [place] 09/28/2022  3:00 PM

## 2022-09-28 NOTE — Progress Notes (Signed)
[facility] MD Progress Note  09/28/2022 11:50 AM [name]  MRN:  [id number] Subjective: [name] is seen on rounds.  He is still depressed and flat.  We talked about going up on his doxepin.  He is able to contract for safety in the hospital.  He is currently homeless and social work is trying to find follow-up appointments for him. Principal Problem: MDD (major depressive disorder) Diagnosis: Principal Problem:   MDD (major depressive disorder)  Total Time spent with patient: 15 minutes  Past Psychiatric History: None  Past Medical History:  Past Medical History:  Diagnosis Date   Asthma    CHF (congestive heart failure) (HCC)    COPD (chronic obstructive pulmonary disease) ([place])    COVID-19 03/2020   diagnosed in August 2021   Diabetes mellitus without complication ([place])    Homelessness    Hypertension    Infestation by bed bug    Migraine    Obesity    Sleep apnea     Past Surgical History:  Procedure Laterality Date   CARDIAC CATHETERIZATION     CHOLECYSTECTOMY     EYE SURGERY     INNER EAR SURGERY     NOSE SURGERY     Family History:  Family History  Problem Relation Age of Onset   Hypertension Mother    Heart disease Mother    Heart failure Maternal Grandmother    Family Psychiatric  History: Unremarkable Social History:  Social History   Substance and Sexual Activity  Alcohol Use Never     Social History   Substance and Sexual Activity  Drug Use Never    Social History   Socioeconomic History   Marital status: Single    Spouse name: Not on file   Number of children: 2   Years of education: 12   Highest education level: 12th grade  Occupational History   Occupation: retired  Tobacco Use   Smoking status: Former   Smokeless tobacco: Never  Scientific laboratory technician Use: Not on file  Substance and Sexual Activity   Alcohol use: Never   Drug use: Never   Sexual activity: Never    Birth control/protection: None  Other Topics Concern   Not on file   Social History Narrative   Not on file   Social Determinants of Health   Financial Resource Strain: High Risk (04/27/2022)   Overall Financial Resource Strain (CARDIA)    Difficulty of Paying Living Expenses: Very hard  Food Insecurity: No Food Insecurity (09/24/2022)   Hunger Vital Sign    Worried About Running Out of Food in the Last Year: Never true    Ran Out of Food in the Last Year: Never true  Transportation Needs: No Transportation Needs (09/24/2022)   PRAPARE - Hydrologist (Medical): No    Lack of Transportation (Non-Medical): No  Physical Activity: Sufficiently Active (04/16/2018)   Exercise Vital Sign    Days of Exercise per Week: 7 days    Minutes of Exercise per Session: 30 min  Stress: Stress Concern Present (04/16/2018)   [place]    Feeling of Stress : Very much  Social Connections: Moderately Isolated (04/30/2018)   Social Connection and Isolation Panel [NHANES]    Frequency of Communication with Friends and Family: Twice a week    Frequency of Social Gatherings with Friends and Family: Never    Attends Religious Services: More than 4 times  per year    Active Member of Clubs or Organizations: No    Attends Archivist Meetings: Never    Marital Status: Widowed   Additional Social History:                         Sleep: Fair  Appetite:  Good  Current Medications: Current Facility-Administered Medications  Medication Dose Route Frequency Provider Last Rate Last Admin   acetaminophen (TYLENOL) tablet 650 mg  650 mg Oral Q4H PRN Bennett, Christal H, NP       Or   acetaminophen (TYLENOL) 160 MG/5ML solution 650 mg  650 mg Per Tube Q4H PRN Bennett, Christal H, NP       Or   acetaminophen (TYLENOL) suppository 650 mg  650 mg Rectal Q4H PRN Bennett, Christal H, NP       albuterol (PROVENTIL) (2.5 MG/3ML) 0.083% nebulizer solution 2.5 mg  2.5 mg  Nebulization Q4H PRN Bennett, Christal H, NP       aspirin suppository 300 mg  300 mg Rectal Daily Bennett, Christal H, NP       Or   aspirin tablet 325 mg  325 mg Oral Daily Bennett, Christal H, NP   325 mg at 09/28/22 0934   atorvastatin (LIPITOR) tablet 40 mg  40 mg Oral Daily Bennett, Christal H, NP   40 mg at 09/28/22 0934   citalopram (CELEXA) tablet 20 mg  20 mg Oral Daily Bennett, Christal H, NP   20 mg at 09/28/22 0934   dextromethorphan-guaiFENesin (MUCINEX DM) 30-600 MG per 12 hr tablet 1 tablet  1 tablet Oral BID PRN Richardson Landry, Christal H, NP       doxepin (SINEQUAN) capsule 25 mg  25 mg Oral QHS Parks Ranger, DO   25 mg at 09/27/22 2127   enoxaparin (LOVENOX) injection 55 mg  0.5 mg/kg Subcutaneous Q24H Bennett, Christal H, NP   55 mg at 09/27/22 2143   hydrALAZINE (APRESOLINE) injection 5 mg  5 mg Intravenous Q2H PRN Bennett, Christal H, NP       hydrOXYzine (ATARAX) tablet 10 mg  10 mg Oral TID PRN Richardson Landry, Christal H, NP   10 mg at 09/27/22 2152   lactulose (CHRONULAC) 10 GM/15ML solution 30 g  30 g Oral BID Bennett, Christal H, NP   30 g at 09/28/22 0935   losartan (COZAAR) tablet 50 mg  50 mg Oral QPC breakfast Parks Ranger, DO   50 mg at 09/28/22 0934   nitroGLYCERIN (NITROSTAT) SL tablet 0.4 mg  0.4 mg Sublingual Q5 Min x 3 PRN Bennett, Christal H, NP       ondansetron (ZOFRAN) injection 4 mg  4 mg Intravenous Q8H PRN Bennett, Christal H, NP       pantoprazole (PROTONIX) EC tablet 20 mg  20 mg Oral Daily Bennett, Christal H, NP   20 mg at 09/28/22 0934   QUEtiapine (SEROQUEL) tablet 50 mg  50 mg Oral QHS Bennett, Christal H, NP   50 mg at 09/27/22 2126   sodium chloride (OCEAN) 0.65 % nasal spray 1 spray  1 spray Each Nare PRN Dietrich Pates H, NP        Lab Results:  Results for orders placed or performed during the hospital encounter of 09/24/22 (from the past 48 hour(s))  Glucose, capillary     Status: Abnormal   Collection Time: 09/27/22  7:45 AM   Result Value Ref Range   Glucose-Capillary  108 (H) 70 - 99 mg/dL    Comment: Glucose reference range applies only to samples taken after fasting for at least 8 hours.  Glucose, capillary     Status: None   Collection Time: 09/28/22  7:45 AM  Result Value Ref Range   Glucose-Capillary 99 70 - 99 mg/dL    Comment: Glucose reference range applies only to samples taken after fasting for at least 8 hours.    Blood Alcohol level:  Lab Results  Component Value Date   ETH <10 09/15/2022   ETH <10 65/10/5463    Metabolic Disorder Labs: Lab Results  Component Value Date   HGBA1C 4.8 09/23/2022   MPG 91.06 09/23/2022   MPG 93.93 09/15/2022   No results found for: "PROLACTIN" Lab Results  Component Value Date   CHOL 140 09/23/2022   TRIG 41 09/23/2022   HDL 40 (L) 09/23/2022   CHOLHDL 3.5 09/23/2022   VLDL 8 09/23/2022   LDLCALC 92 09/23/2022   LDLCALC 57 04/29/2022    Physical Findings: AIMS:  , ,  ,  ,    CIWA:    COWS:     Musculoskeletal: Strength & Muscle Tone: within normal limits Gait & Station: normal Patient leans: N/A  Psychiatric Specialty Exam:  Presentation  General Appearance:  Disheveled  Eye Contact: Good  Speech: Clear and Coherent  Speech Volume: Normal  Handedness: Right   Mood and Affect  Mood: Euphoric  Affect: Congruent   Thought Process  Thought Processes: Coherent  Descriptions of Associations:Intact  Orientation:Full (Time, Place and Person)  Thought Content:WDL  History of Schizophrenia/Schizoaffective disorder:No  Duration of Psychotic Symptoms:Less than six months  Hallucinations:No data recorded Ideas of Reference:None  Suicidal Thoughts:No data recorded Homicidal Thoughts:No data recorded  Sensorium  Memory: Immediate Good; Recent Good  Judgment: Fair  Insight: Poor   Executive Functions  Concentration: Good  Attention Span: Good  Recall: Good  Fund of  Knowledge: Poor  Language: Fair   Psychomotor Activity  Psychomotor Activity:No data recorded  Assets  Assets: Financial Resources/Insurance; Resilience   Sleep  Sleep:No data recorded   Physical Exam: Physical Exam Vitals and nursing note reviewed.  Constitutional:      Appearance: Normal appearance. He is normal weight.  Neurological:     General: No focal deficit present.     Mental Status: He is alert and oriented to person, place, and time.  Psychiatric:        Attention and Perception: Attention and perception normal.        Mood and Affect: Mood is depressed. Affect is flat.        Speech: Speech normal.        Behavior: Behavior normal. Behavior is cooperative.        Thought Content: Thought content normal.        Cognition and Memory: Cognition and memory normal.        Judgment: Judgment normal.    Review of Systems  Constitutional: Negative.   HENT: Negative.    Eyes: Negative.   Respiratory: Negative.    Cardiovascular: Negative.   Gastrointestinal: Negative.   Genitourinary: Negative.   Musculoskeletal: Negative.   Skin: Negative.   Neurological: Negative.   Endo/Heme/Allergies: Negative.   Psychiatric/Behavioral:  Positive for depression.    Blood pressure (!) 144/73, pulse (!) 53, temperature 97.7 F (36.5 C), temperature source Oral, resp. rate 20, height '5\' 6"'$  (1.676 m), weight 111 kg, SpO2 100 %. Body mass index is 39.5 kg/m.  Treatment Plan Summary: Daily contact with patient to assess and evaluate symptoms and progress in treatment, Medication management, and Plan increase doxepin to 50 mg at bedtime.  Parks Ranger, DO 09/28/2022, 11:50 AM

## 2022-09-28 NOTE — Plan of Care (Signed)
Pt denies anxiety/depression at this time. Pt denies SI/HI/AVH or pain at this time. Pt is calm and cooperative. Pt is medication compliant. Pt provided with support and encouragement. Pt monitored q15 minutes for safety per unit policy. Plan of care ongoing.   Problem: Education: Goal: Knowledge of General Education information will improve Description: Including pain rating scale, medication(s)/side effects and non-pharmacologic comfort measures Outcome: Progressing   Problem: Nutrition: Goal: Adequate nutrition will be maintained Outcome: Progressing

## 2022-09-29 NOTE — BH Assessment (Incomplete)
1900 Received patient sitting in the dayroom visiting with daughter. He appear to be enjoying the visit.  1950 His visit ended and he immediately return to his room and went to bed.  2015 Patient is noted asleep in his bed. Will continue to monitor patient for safety.  2030 Patient is alert and oriented x 4, he denies pain. He is currently denying SI/HI, A/V hallucinations, depression. States his mood is good because he had a visited with his daughter.  2100 Patient is compliant with all medications. He requested and received Atarax for anxiety 5/10 and saline nose spray for nasal congestion. He came out of his room to have a snack and as soon as he completed his snack he returned to bed. Will continue to monitor patient for safety and worsening nasal congestion.  2200 Patient did voiced relief from the saline nasal spray he is not as nasally congested as he was prior to treatment. Will continue to monitor for comfort.  0100 Patient not to have been asleep since 2300 in response to sleep medication and no further complaints of itching nor anxiety.   0500 Patient has rested for the most of this time frame; he has awaken to use the restroom. Will continue  to monitor for safety.  060 Patient remains asleep and in no distress.

## 2022-09-29 NOTE — Progress Notes (Incomplete)
Patient was cooperative with treatment on shift. A&O x 4. No complaints of physical or mental distress. Denies SI HI AVH. Taking medications as prescribed. Continuing to monitor via q 15 minute checks.

## 2022-09-29 NOTE — Progress Notes (Signed)
Patient is A+O x 4. He denies pain. Denies SI/HI/AVH. Denies depression and anxiety. Affect sad. Mood appropriate/pleasant. Medication compliant. Good appetite. Patient states, "I slept very well and reports no complaints.  Q15 minute unit checks in place.

## 2022-09-29 NOTE — BHH Group Notes (Signed)
[place] Group Notes:  (Nursing/MHT/Case Management/Adjunct)  Date:  09/29/2022  Time:  5:18 PM  Type of Therapy:  Group Therapy  Participation Level:  Active  Participation Quality:  Appropriate  Affect:  Flat  Cognitive:  Alert  Insight:  Appropriate  Engagement in Group:  Engaged  Modes of Intervention:  Discussion  Summary of Progress/Problems: List 2 things that you have learned during your stay...  Ileene Musa 09/29/2022, 5:18 PM

## 2022-09-29 NOTE — Plan of Care (Signed)

## 2022-09-29 NOTE — Progress Notes (Signed)
[facility] MD Progress Note  09/29/2022 12:22 PM [name]  MRN:  [id number] Subjective: Patient seen and chart reviewed.  72 year old man with depression.  Patient had no specific new complaint today.  He identified his chief concern is getting his strength back in his arms and legs.  Denied feeling acutely depressed denied suicidal thought.  Nursing noticed his chronic bradycardia which seems to have been a standing issue for quite a while. Principal Problem: MDD (major depressive disorder) Diagnosis: Principal Problem:   MDD (major depressive disorder)  Total Time spent with patient: 30 minutes  Past Psychiatric History: Past history of depression and multiple medical problems homelessness  Past Medical History:  Past Medical History:  Diagnosis Date   Asthma    CHF (congestive heart failure) (HCC)    COPD (chronic obstructive pulmonary disease) ([place])    COVID-19 03/2020   diagnosed in August 2021   Diabetes mellitus without complication ([place])    Homelessness    Hypertension    Infestation by bed bug    Migraine    Obesity    Sleep apnea     Past Surgical History:  Procedure Laterality Date   CARDIAC CATHETERIZATION     CHOLECYSTECTOMY     EYE SURGERY     INNER EAR SURGERY     NOSE SURGERY     Family History:  Family History  Problem Relation Age of Onset   Hypertension Mother    Heart disease Mother    Heart failure Maternal Grandmother    Family Psychiatric  History: See previous Social History:  Social History   Substance and Sexual Activity  Alcohol Use Never     Social History   Substance and Sexual Activity  Drug Use Never    Social History   Socioeconomic History   Marital status: Single    Spouse name: Not on file   Number of children: 2   Years of education: 12   Highest education level: 12th grade  Occupational History   Occupation: retired  Tobacco Use   Smoking status: Former   Smokeless tobacco: Never  Scientific laboratory technician Use: Not on  file  Substance and Sexual Activity   Alcohol use: Never   Drug use: Never   Sexual activity: Never    Birth control/protection: None  Other Topics Concern   Not on file  Social History Narrative   Not on file   Social Determinants of Health   Financial Resource Strain: High Risk (04/27/2022)   Overall Financial Resource Strain (CARDIA)    Difficulty of Paying Living Expenses: Very hard  Food Insecurity: No Food Insecurity (09/24/2022)   Hunger Vital Sign    Worried About Running Out of Food in the Last Year: Never true    Ran Out of Food in the Last Year: Never true  Transportation Needs: No Transportation Needs (09/24/2022)   PRAPARE - Hydrologist (Medical): No    Lack of Transportation (Non-Medical): No  Physical Activity: Sufficiently Active (04/16/2018)   Exercise Vital Sign    Days of Exercise per Week: 7 days    Minutes of Exercise per Session: 30 min  Stress: Stress Concern Present (04/16/2018)   [place]    Feeling of Stress : Very much  Social Connections: Moderately Isolated (04/30/2018)   Social Connection and Isolation Panel [NHANES]    Frequency of Communication with Friends and Family: Twice a week  Frequency of Social Gatherings with Friends and Family: Never    Attends Religious Services: More than 4 times per year    Active Member of Clubs or Organizations: No    Attends Archivist Meetings: Never    Marital Status: Widowed   Additional Social History:                         Sleep: Fair  Appetite:  Fair  Current Medications: Current Facility-Administered Medications  Medication Dose Route Frequency Provider Last Rate Last Admin   acetaminophen (TYLENOL) tablet 650 mg  650 mg Oral Q4H PRN Bennett, Christal H, NP   650 mg at 09/28/22 1820   Or   acetaminophen (TYLENOL) 160 MG/5ML solution 650 mg  650 mg Per Tube Q4H PRN Bennett, Christal  H, NP       Or   acetaminophen (TYLENOL) suppository 650 mg  650 mg Rectal Q4H PRN Bennett, Christal H, NP       albuterol (PROVENTIL) (2.5 MG/3ML) 0.083% nebulizer solution 2.5 mg  2.5 mg Nebulization Q4H PRN Bennett, Christal H, NP       aspirin suppository 300 mg  300 mg Rectal Daily Bennett, Christal H, NP       Or   aspirin tablet 325 mg  325 mg Oral Daily Bennett, Christal H, NP   325 mg at 09/29/22 1143   atorvastatin (LIPITOR) tablet 40 mg  40 mg Oral Daily Bennett, Christal H, NP   40 mg at 09/29/22 1143   citalopram (CELEXA) tablet 20 mg  20 mg Oral Daily Bennett, Christal H, NP   20 mg at 09/29/22 1143   dextromethorphan-guaiFENesin (MUCINEX DM) 30-600 MG per 12 hr tablet 1 tablet  1 tablet Oral BID PRN Dietrich Pates H, NP   1 tablet at 09/28/22 2055   doxepin (SINEQUAN) capsule 50 mg  50 mg Oral QHS Parks Ranger, DO   50 mg at 09/28/22 2054   enoxaparin (LOVENOX) injection 55 mg  0.5 mg/kg Subcutaneous Q24H Bennett, Christal H, NP   55 mg at 09/28/22 2054   hydrALAZINE (APRESOLINE) injection 5 mg  5 mg Intravenous Q2H PRN Bennett, Christal H, NP       hydrOXYzine (ATARAX) tablet 10 mg  10 mg Oral TID PRN Richardson Landry, Christal H, NP   10 mg at 09/27/22 2152   lactulose (CHRONULAC) 10 GM/15ML solution 30 g  30 g Oral BID Bennett, Christal H, NP   30 g at 09/29/22 1143   losartan (COZAAR) tablet 50 mg  50 mg Oral QPC breakfast Parks Ranger, DO   50 mg at 09/28/22 0934   nitroGLYCERIN (NITROSTAT) SL tablet 0.4 mg  0.4 mg Sublingual Q5 Min x 3 PRN Bennett, Christal H, NP       ondansetron (ZOFRAN) injection 4 mg  4 mg Intravenous Q8H PRN Bennett, Christal H, NP       pantoprazole (PROTONIX) EC tablet 20 mg  20 mg Oral Daily Bennett, Christal H, NP   20 mg at 09/29/22 1143   QUEtiapine (SEROQUEL) tablet 50 mg  50 mg Oral QHS Bennett, Christal H, NP   50 mg at 09/28/22 2053   sodium chloride (OCEAN) 0.65 % nasal spray 1 spray  1 spray Each Nare PRN Dietrich Pates H,  NP        Lab Results:  Results for orders placed or performed during the hospital encounter of 09/24/22 (from the past 48 hour(s))  Glucose, capillary     Status: None   Collection Time: 09/28/22  7:45 AM  Result Value Ref Range   Glucose-Capillary 99 70 - 99 mg/dL    Comment: Glucose reference range applies only to samples taken after fasting for at least 8 hours.    Blood Alcohol level:  Lab Results  Component Value Date   ETH <10 09/15/2022   ETH <10 16/96/7893    Metabolic Disorder Labs: Lab Results  Component Value Date   HGBA1C 4.8 09/23/2022   MPG 91.06 09/23/2022   MPG 93.93 09/15/2022   No results found for: "PROLACTIN" Lab Results  Component Value Date   CHOL 140 09/23/2022   TRIG 41 09/23/2022   HDL 40 (L) 09/23/2022   CHOLHDL 3.5 09/23/2022   VLDL 8 09/23/2022   LDLCALC 92 09/23/2022   LDLCALC 57 04/29/2022    Physical Findings: AIMS:  , ,  ,  ,    CIWA:    COWS:     Musculoskeletal: Strength & Muscle Tone: within normal limits Gait & Station: normal Patient leans: N/A  Psychiatric Specialty Exam:  Presentation  General Appearance:  Disheveled  Eye Contact: Good  Speech: Clear and Coherent  Speech Volume: Normal  Handedness: Right   Mood and Affect  Mood: Euphoric  Affect: Congruent   Thought Process  Thought Processes: Coherent  Descriptions of Associations:Intact  Orientation:Full (Time, Place and Person)  Thought Content:WDL  History of Schizophrenia/Schizoaffective disorder:No  Duration of Psychotic Symptoms:Less than six months  Hallucinations:No data recorded Ideas of Reference:None  Suicidal Thoughts:No data recorded Homicidal Thoughts:No data recorded  Sensorium  Memory: Immediate Good; Recent Good  Judgment: Fair  Insight: Poor   Executive Functions  Concentration: Good  Attention Span: Good  Recall: Good  Fund of Knowledge: Poor  Language: Fair   Psychomotor Activity   Psychomotor Activity:No data recorded  Assets  Assets: Financial Resources/Insurance; Resilience   Sleep  Sleep:No data recorded   Physical Exam: Physical Exam Vitals and nursing note reviewed.  Constitutional:      Appearance: Normal appearance.  HENT:     Head: Normocephalic and atraumatic.     Mouth/Throat:     Pharynx: Oropharynx is clear.  Eyes:     Pupils: Pupils are equal, round, and reactive to light.  Cardiovascular:     Rate and Rhythm: Normal rate and regular rhythm.  Pulmonary:     Effort: Pulmonary effort is normal.     Breath sounds: Normal breath sounds.  Abdominal:     General: Abdomen is flat.     Palpations: Abdomen is soft.  Musculoskeletal:        General: Normal range of motion.  Skin:    General: Skin is warm and dry.  Neurological:     General: No focal deficit present.     Mental Status: He is alert. Mental status is at baseline.  Psychiatric:        Attention and Perception: Attention normal.        Mood and Affect: Mood normal.        Speech: Speech normal.        Behavior: Behavior is cooperative.        Thought Content: Thought content normal.        Cognition and Memory: Memory is impaired.    Review of Systems  Constitutional: Negative.   HENT: Negative.    Eyes: Negative.   Respiratory: Negative.    Cardiovascular: Negative.   Gastrointestinal: Negative.  Musculoskeletal: Negative.   Skin: Negative.   Neurological: Negative.   Psychiatric/Behavioral: Negative.     Blood pressure (!) 160/85, pulse (!) 50, temperature (!) 97.5 F (36.4 C), resp. rate 16, height '5\' 6"'$  (1.676 m), weight 111 kg, SpO2 100 %. Body mass index is 39.5 kg/m.   Treatment Plan Summary: Medication management and Plan chronic bradycardia.  Medicines that he is taking would not typically be associated with worsening that.  Blood pressure still runs high.  Plan to continue current blood pressure medicine.  Patient is being watched by nursing if there  is any sign of any dizziness.  So far no dizziness complaints.  No change to other medicine.  Alethia Berthold, MD 09/29/2022, 12:22 PM

## 2022-09-30 NOTE — Progress Notes (Addendum)
Patient is A+O x4. He denies SI/HI/AVH. Denies anxiety and depression. Pain 0/10. Independently showered today. Medication compliant. Good appetite. Patient's pulse is slightly better than yesterday although still bradycardic.   Prior to eating dinner, patient stated that he was feeling tremors in his hands but that he might just be cold. Vitals assessed minutes prior were WNL for patient. No additional complaints. Patient denies feeling lightheaded, dizzy, or in pain. Patient encouraged to report in further issues to staff.  Q15 minute checks in place.

## 2022-09-30 NOTE — Plan of Care (Signed)
  Problem: Nutrition: Goal: Adequate nutrition will be maintained Outcome: Progressing   Problem: Safety: Goal: Ability to remain free from injury will improve Outcome: Progressing   Problem: Coping: Goal: Will verbalize positive feelings about self Outcome: Progressing   Problem: Coping: Goal: Level of anxiety will decrease Outcome: Not Progressing

## 2022-09-30 NOTE — Group Note (Signed)
LCSW Group Therapy Note  Group Date: 09/29/2022 Start Time: 1000 End Time: 1100   Type of Therapy and Topic:  Group Therapy: Positive Affirmations  Participation Level:  None   Description of Group:   This group addressed positive affirmation towards self and others.  Patients went around the room and identified two positive things about themselves and two positive things about a peer in the room.  Patients reflected on how it felt to share something positive with others, to identify positive things about themselves, and to hear positive things from others/ Patients were encouraged to have a daily reflection of positive characteristics or circumstances.   Therapeutic Goals: Patients will verbalize two of their positive qualities Patients will demonstrate empathy for others by stating two positive qualities about a peer in the group Patients will verbalize their feelings when voicing positive self affirmations and when voicing positive affirmations of others Patients will discuss the potential positive impact on their wellness/recovery of focusing on positive traits of self and others.  Summary of Patient Progress:  was not appropriate  Therapeutic Modalities:   Cognitive [place], [place] 09/30/2022  3:43 PM

## 2022-09-30 NOTE — Progress Notes (Signed)
[facility] MD Progress Note  09/30/2022 2:06 PM [name]  MRN:  [id number] Subjective: Follow-up 72 year old man with major depression.  No new complaints.  No changes to behavior.  Eating okay.  Denies suicidal thoughts.  Blunted affect but no bizarre behavior.  Blood pressure today it was a higher than normal reading.  Pulse is still a little bradycardic. Principal Problem: MDD (major depressive disorder) Diagnosis: Principal Problem:   MDD (major depressive disorder)  Total Time spent with patient: 20 minutes  Past Psychiatric History: History of depression and multiple medical problems  Past Medical History:  Past Medical History:  Diagnosis Date   Asthma    CHF (congestive heart failure) (HCC)    COPD (chronic obstructive pulmonary disease) ([place])    COVID-19 03/2020   diagnosed in August 2021   Diabetes mellitus without complication ([place])    Homelessness    Hypertension    Infestation by bed bug    Migraine    Obesity    Sleep apnea     Past Surgical History:  Procedure Laterality Date   CARDIAC CATHETERIZATION     CHOLECYSTECTOMY     EYE SURGERY     INNER EAR SURGERY     NOSE SURGERY     Family History:  Family History  Problem Relation Age of Onset   Hypertension Mother    Heart disease Mother    Heart failure Maternal Grandmother    Family Psychiatric  History: See previous Social History:  Social History   Substance and Sexual Activity  Alcohol Use Never     Social History   Substance and Sexual Activity  Drug Use Never    Social History   Socioeconomic History   Marital status: Single    Spouse name: Not on file   Number of children: 2   Years of education: 12   Highest education level: 12th grade  Occupational History   Occupation: retired  Tobacco Use   Smoking status: Former   Smokeless tobacco: Never  Scientific laboratory technician Use: Not on file  Substance and Sexual Activity   Alcohol use: Never   Drug use: Never   Sexual activity: Never     Birth control/protection: None  Other Topics Concern   Not on file  Social History Narrative   Not on file   Social Determinants of Health   Financial Resource Strain: High Risk (04/27/2022)   Overall Financial Resource Strain (CARDIA)    Difficulty of Paying Living Expenses: Very hard  Food Insecurity: No Food Insecurity (09/24/2022)   Hunger Vital Sign    Worried About Running Out of Food in the Last Year: Never true    Ran Out of Food in the Last Year: Never true  Transportation Needs: No Transportation Needs (09/24/2022)   PRAPARE - Hydrologist (Medical): No    Lack of Transportation (Non-Medical): No  Physical Activity: Sufficiently Active (04/16/2018)   Exercise Vital Sign    Days of Exercise per Week: 7 days    Minutes of Exercise per Session: 30 min  Stress: Stress Concern Present (04/16/2018)   [place]    Feeling of Stress : Very much  Social Connections: Moderately Isolated (04/30/2018)   Social Connection and Isolation Panel [NHANES]    Frequency of Communication with Friends and Family: Twice a week    Frequency of Social Gatherings with Friends and Family: Never    Attends  Religious Services: More than 4 times per year    Active Member of Clubs or Organizations: No    Attends Archivist Meetings: Never    Marital Status: Widowed   Additional Social History:                         Sleep: Fair  Appetite:  Fair  Current Medications: Current Facility-Administered Medications  Medication Dose Route Frequency Provider Last Rate Last Admin   acetaminophen (TYLENOL) tablet 650 mg  650 mg Oral Q4H PRN Bennett, Christal H, NP   650 mg at 09/28/22 1820   Or   acetaminophen (TYLENOL) 160 MG/5ML solution 650 mg  650 mg Per Tube Q4H PRN Bennett, Christal H, NP       Or   acetaminophen (TYLENOL) suppository 650 mg  650 mg Rectal Q4H PRN Bennett, Christal H,  NP       albuterol (PROVENTIL) (2.5 MG/3ML) 0.083% nebulizer solution 2.5 mg  2.5 mg Nebulization Q4H PRN Bennett, Christal H, NP       aspirin suppository 300 mg  300 mg Rectal Daily Bennett, Christal H, NP       Or   aspirin tablet 325 mg  325 mg Oral Daily Bennett, Christal H, NP   325 mg at 09/30/22 0934   atorvastatin (LIPITOR) tablet 40 mg  40 mg Oral Daily Bennett, Christal H, NP   40 mg at 09/30/22 0934   citalopram (CELEXA) tablet 20 mg  20 mg Oral Daily Bennett, Christal H, NP   20 mg at 09/30/22 0934   dextromethorphan-guaiFENesin (MUCINEX DM) 30-600 MG per 12 hr tablet 1 tablet  1 tablet Oral BID PRN Dietrich Pates H, NP   1 tablet at 09/28/22 2055   doxepin (SINEQUAN) capsule 50 mg  50 mg Oral QHS Parks Ranger, DO   50 mg at 09/29/22 2045   enoxaparin (LOVENOX) injection 55 mg  0.5 mg/kg Subcutaneous Q24H Bennett, Christal H, NP   55 mg at 09/29/22 2046   hydrALAZINE (APRESOLINE) injection 5 mg  5 mg Intravenous Q2H PRN Bennett, Christal H, NP       hydrOXYzine (ATARAX) tablet 10 mg  10 mg Oral TID PRN Richardson Landry, Christal H, NP   10 mg at 09/29/22 2058   lactulose (CHRONULAC) 10 GM/15ML solution 30 g  30 g Oral BID Bennett, Christal H, NP   30 g at 09/30/22 0934   losartan (COZAAR) tablet 50 mg  50 mg Oral QPC breakfast Parks Ranger, DO   50 mg at 09/30/22 0934   nitroGLYCERIN (NITROSTAT) SL tablet 0.4 mg  0.4 mg Sublingual Q5 Min x 3 PRN Bennett, Christal H, NP       ondansetron (ZOFRAN) injection 4 mg  4 mg Intravenous Q8H PRN Bennett, Christal H, NP       pantoprazole (PROTONIX) EC tablet 20 mg  20 mg Oral Daily Bennett, Christal H, NP   20 mg at 09/30/22 0934   QUEtiapine (SEROQUEL) tablet 50 mg  50 mg Oral QHS Bennett, Christal H, NP   50 mg at 09/29/22 2045   sodium chloride (OCEAN) 0.65 % nasal spray 1 spray  1 spray Each Nare PRN Richardson Landry, Christal H, NP   1 spray at 09/29/22 2059    Lab Results: No results found for this or any previous visit (from the  past 48 hour(s)).  Blood Alcohol level:  Lab Results  Component Value Date   ETH <  10 09/15/2022   ETH <10 02/58/5277    Metabolic Disorder Labs: Lab Results  Component Value Date   HGBA1C 4.8 09/23/2022   MPG 91.06 09/23/2022   MPG 93.93 09/15/2022   No results found for: "PROLACTIN" Lab Results  Component Value Date   CHOL 140 09/23/2022   TRIG 41 09/23/2022   HDL 40 (L) 09/23/2022   CHOLHDL 3.5 09/23/2022   VLDL 8 09/23/2022   LDLCALC 92 09/23/2022   LDLCALC 57 04/29/2022    Physical Findings: AIMS:  , ,  ,  ,    CIWA:    COWS:     Musculoskeletal: Strength & Muscle Tone: within normal limits Gait & Station: normal Patient leans: N/A  Psychiatric Specialty Exam:  Presentation  General Appearance:  Disheveled  Eye Contact: Good  Speech: Clear and Coherent  Speech Volume: Normal  Handedness: Right   Mood and Affect  Mood: Euphoric  Affect: Congruent   Thought Process  Thought Processes: Coherent  Descriptions of Associations:Intact  Orientation:Full (Time, Place and Person)  Thought Content:WDL  History of Schizophrenia/Schizoaffective disorder:No  Duration of Psychotic Symptoms:Less than six months  Hallucinations:No data recorded Ideas of Reference:None  Suicidal Thoughts:No data recorded Homicidal Thoughts:No data recorded  Sensorium  Memory: Immediate Good; Recent Good  Judgment: Fair  Insight: Poor   Executive Functions  Concentration: Good  Attention Span: Good  Recall: Good  Fund of Knowledge: Poor  Language: Fair   Psychomotor Activity  Psychomotor Activity:No data recorded  Assets  Assets: Financial Resources/Insurance; Resilience   Sleep  Sleep:No data recorded   Physical Exam: Physical Exam Vitals and nursing note reviewed.  Constitutional:      Appearance: Normal appearance.  HENT:     Head: Normocephalic and atraumatic.     Mouth/Throat:     Pharynx: Oropharynx is clear.   Eyes:     Pupils: Pupils are equal, round, and reactive to light.  Cardiovascular:     Rate and Rhythm: Normal rate and regular rhythm.  Pulmonary:     Effort: Pulmonary effort is normal.     Breath sounds: Normal breath sounds.  Abdominal:     General: Abdomen is flat.     Palpations: Abdomen is soft.  Musculoskeletal:        General: Normal range of motion.  Skin:    General: Skin is warm and dry.  Neurological:     General: No focal deficit present.     Mental Status: He is alert. Mental status is at baseline.  Psychiatric:        Attention and Perception: Attention normal.        Mood and Affect: Mood normal. Affect is blunt.        Speech: Speech is delayed.        Behavior: Behavior is slowed.        Thought Content: Thought content normal.        Cognition and Memory: Cognition is impaired.    Review of Systems  Constitutional: Negative.   HENT: Negative.    Eyes: Negative.   Respiratory: Negative.    Cardiovascular: Negative.   Gastrointestinal: Negative.   Musculoskeletal: Negative.   Skin: Negative.   Neurological: Negative.   Psychiatric/Behavioral: Negative.     Blood pressure (!) 172/75, pulse 61, temperature 97.7 F (36.5 C), resp. rate 18, height '5\' 6"'$  (1.676 m), weight 111 kg, SpO2 100 %. Body mass index is 39.5 kg/m.   Treatment Plan Summary: Medication management and Plan no  change to medication.  Supportive encouragement.  Review of plan with nursing.  Despite higher blood pressure today I will not change his medicine for now.  It had been running at a reasonable rate recently and it would be better for it to be slightly high then for [place] to drop too low especially with the bradycardia.  Alethia Berthold, MD 09/30/2022, 2:06 PM

## 2022-09-30 NOTE — Group Note (Signed)
[facility] LCSW Group Therapy Note   Group Date: 09/30/2022 Start Time: 1100 End Time: 1200   Type of Therapy and Topic: Group Therapy: Avoiding Self-Sabotaging and Enabling Behaviors  Participation Level: None  Mood:  Description of Group:  In this group, patients will learn how to identify obstacles, self-sabotaging and enabling behaviors, as well as: what are they, why do we do them and what needs these behaviors meet. Discuss unhealthy relationships and how to have positive healthy boundaries with those that sabotage and enable. Explore aspects of self-sabotage and enabling in yourself and how to limit these self-destructive behaviors in everyday life.   Therapeutic Goals: 1. Patient will identify one obstacle that relates to self-sabotage and enabling behaviors 2. Patient will identify one personal self-sabotaging or enabling behavior they did prior to admission 3. Patient will state a plan to change the above identified behavior 4. Patient will demonstrate ability to communicate their needs through discussion and/or role play.    Summary of Patient Progress:   Patient was not appropriate for group   Therapeutic Modalities:  Cognitive Behavioral Therapy Person-Centered Therapy Motivational Interviewing    Lubertha South, LCSW

## 2022-10-01 LAB — COMPREHENSIVE METABOLIC PANEL
ALT: 27 U/L (ref 0–44)
AST: 43 U/L — ABNORMAL HIGH (ref 15–41)
Albumin: 2.3 g/dL — ABNORMAL LOW (ref 3.5–5.0)
Alkaline Phosphatase: 120 U/L (ref 38–126)
Anion gap: 6 (ref 5–15)
BUN: 20 mg/dL (ref 8–23)
CO2: 22 mmol/L (ref 22–32)
Calcium: 9.2 mg/dL (ref 8.9–10.3)
Chloride: 111 mmol/L (ref 98–111)
Creatinine, Ser: 0.86 mg/dL (ref 0.61–1.24)
GFR, Estimated: >60 mL/min (ref >60)
Glucose, Bld: 129 mg/dL — ABNORMAL HIGH (ref 70–99)
Potassium: 3.9 mmol/L (ref 3.5–5.1)
Sodium: 139 mmol/L (ref 135–145)
Total Bilirubin: 1.2 mg/dL (ref 0.3–1.2)
Total Protein: 6.1 g/dL — ABNORMAL LOW (ref 6.5–8.1)

## 2022-10-01 LAB — CBC WITH DIFFERENTIAL/PLATELET
Abs Immature Granulocytes: 0.01 10*3/uL (ref 0.00–0.07)
Basophils Absolute: 0 10*3/uL (ref 0.0–0.1)
Basophils Relative: 1 %
Eosinophils Absolute: 0.1 10*3/uL (ref 0.0–0.5)
Eosinophils Relative: 4 %
HCT: 36.5 % — ABNORMAL LOW (ref 39.0–52.0)
Hemoglobin: 11.9 g/dL — ABNORMAL LOW (ref 13.0–17.0)
Immature Granulocytes: 0 %
Lymphocytes Relative: 32 %
Lymphs Abs: 0.8 10*3/uL (ref 0.7–4.0)
MCH: 31.2 pg (ref 26.0–34.0)
MCHC: 32.6 g/dL (ref 30.0–36.0)
MCV: 95.8 fL (ref 80.0–100.0)
Monocytes Absolute: 0.4 10*3/uL (ref 0.1–1.0)
Monocytes Relative: 14 %
Neutro Abs: 1.2 10*3/uL — ABNORMAL LOW (ref 1.7–7.7)
Neutrophils Relative %: 49 %
Platelets: 78 10*3/uL — ABNORMAL LOW (ref 150–400)
RBC: 3.81 MIL/uL — ABNORMAL LOW (ref 4.22–5.81)
RDW: 17.5 % — ABNORMAL HIGH (ref 11.5–15.5)
WBC: 2.5 10*3/uL — ABNORMAL LOW (ref 4.0–10.5)
nRBC: 0 % (ref 0.0–0.2)

## 2022-10-01 LAB — AMMONIA: Ammonia: 79 umol/L — ABNORMAL HIGH (ref 9–35)

## 2022-10-01 MED ORDER — LACTULOSE 10 GM/15ML PO SOLN
30.0000 g | Freq: Three times a day (TID) | ORAL | Status: DC
  Administered 2022-10-01: 30 g via ORAL
  Filled 2022-10-01: qty 60

## 2022-10-01 MED ORDER — LACTULOSE 10 GM/15ML PO SOLN
30.0000 g | Freq: Four times a day (QID) | ORAL | Status: DC
  Administered 2022-10-02 – 2022-10-04 (×9): 30 g via ORAL
  Filled 2022-10-01 (×10): qty 60

## 2022-10-01 NOTE — Progress Notes (Signed)
[facility] MD Progress Note  10/01/2022 12:20 PM [name]  MRN:  [id number] Subjective: [name] is seen on rounds.  Nurses tell me that he is more confused.  He says that he does not feel right today.  He did not have any problems yesterday.  He has had trouble with his ammonia level.  So we will check it and it is higher at 79.  An increase his lactulose.  Check a hepatitis panel next time.  Surprised that they did not check it on medicine. Principal Problem: MDD (major depressive disorder) Diagnosis: Principal Problem:   MDD (major depressive disorder)  Total Time spent with patient: 15 minutes  Past Psychiatric History: Depression  Past Medical History:  Past Medical History:  Diagnosis Date   Asthma    CHF (congestive heart failure) (HCC)    COPD (chronic obstructive pulmonary disease) ([place])    COVID-19 03/2020   diagnosed in August 2021   Diabetes mellitus without complication ([place])    Homelessness    Hypertension    Infestation by bed bug    Migraine    Obesity    Sleep apnea     Past Surgical History:  Procedure Laterality Date   CARDIAC CATHETERIZATION     CHOLECYSTECTOMY     EYE SURGERY     INNER EAR SURGERY     NOSE SURGERY     Family History:  Family History  Problem Relation Age of Onset   Hypertension Mother    Heart disease Mother    Heart failure Maternal Grandmother    Family Psychiatric  History: Unremarkable Social History:  Social History   Substance and Sexual Activity  Alcohol Use Never     Social History   Substance and Sexual Activity  Drug Use Never    Social History   Socioeconomic History   Marital status: Single    Spouse name: Not on file   Number of children: 2   Years of education: 12   Highest education level: 12th grade  Occupational History   Occupation: retired  Tobacco Use   Smoking status: Former   Smokeless tobacco: Never  Scientific laboratory technician Use: Not on file  Substance and Sexual Activity   Alcohol use: Never    Drug use: Never   Sexual activity: Never    Birth control/protection: None  Other Topics Concern   Not on file  Social History Narrative   Not on file   Social Determinants of Health   Financial Resource Strain: High Risk (04/27/2022)   Overall Financial Resource Strain (CARDIA)    Difficulty of Paying Living Expenses: Very hard  Food Insecurity: No Food Insecurity (09/24/2022)   Hunger Vital Sign    Worried About Running Out of Food in the Last Year: Never true    Ran Out of Food in the Last Year: Never true  Transportation Needs: No Transportation Needs (09/24/2022)   PRAPARE - Hydrologist (Medical): No    Lack of Transportation (Non-Medical): No  Physical Activity: Sufficiently Active (04/16/2018)   Exercise Vital Sign    Days of Exercise per Week: 7 days    Minutes of Exercise per Session: 30 min  Stress: Stress Concern Present (04/16/2018)   [place]    Feeling of Stress : Very much  Social Connections: Moderately Isolated (04/30/2018)   Social Connection and Isolation Panel [NHANES]    Frequency of Communication with Friends  and Family: Twice a week    Frequency of Social Gatherings with Friends and Family: Never    Attends Religious Services: More than 4 times per year    Active Member of Clubs or Organizations: No    Attends Archivist Meetings: Never    Marital Status: Widowed   Additional Social History:                         Sleep: Good  Appetite:  Good  Current Medications: Current Facility-Administered Medications  Medication Dose Route Frequency Provider Last Rate Last Admin   acetaminophen (TYLENOL) tablet 650 mg  650 mg Oral Q4H PRN Bennett, Christal H, NP   650 mg at 09/28/22 1820   Or   acetaminophen (TYLENOL) 160 MG/5ML solution 650 mg  650 mg Per Tube Q4H PRN Bennett, Christal H, NP       Or   acetaminophen (TYLENOL) suppository 650 mg   650 mg Rectal Q4H PRN Bennett, Christal H, NP       albuterol (PROVENTIL) (2.5 MG/3ML) 0.083% nebulizer solution 2.5 mg  2.5 mg Nebulization Q4H PRN Bennett, Christal H, NP       aspirin suppository 300 mg  300 mg Rectal Daily Bennett, Christal H, NP       Or   aspirin tablet 325 mg  325 mg Oral Daily Bennett, Christal H, NP   325 mg at 10/01/22 0958   atorvastatin (LIPITOR) tablet 40 mg  40 mg Oral Daily Bennett, Christal H, NP   40 mg at 10/01/22 0958   citalopram (CELEXA) tablet 20 mg  20 mg Oral Daily Bennett, Christal H, NP   20 mg at 10/01/22 0958   dextromethorphan-guaiFENesin ([place] DM) 30-600 MG per 12 hr tablet 1 tablet  1 tablet Oral BID PRN Dietrich Pates H, NP   1 tablet at 09/30/22 1940   doxepin (SINEQUAN) capsule 50 mg  50 mg Oral QHS Parks Ranger, DO   50 mg at 09/30/22 2114   enoxaparin (LOVENOX) injection 55 mg  0.5 mg/kg Subcutaneous Q24H Bennett, Christal H, NP   55 mg at 09/30/22 2111   hydrALAZINE (APRESOLINE) injection 5 mg  5 mg Intravenous Q2H PRN Bennett, Christal H, NP       hydrOXYzine (ATARAX) tablet 10 mg  10 mg Oral TID PRN Richardson Landry, Christal H, NP   10 mg at 09/29/22 2058   lactulose (CHRONULAC) 10 GM/15ML solution 30 g  30 g Oral TID Parks Ranger, DO       losartan (COZAAR) tablet 50 mg  50 mg Oral QPC breakfast Parks Ranger, DO   50 mg at 10/01/22 4098   nitroGLYCERIN (NITROSTAT) SL tablet 0.4 mg  0.4 mg Sublingual Q5 Min x 3 PRN Bennett, Christal H, NP       ondansetron (ZOFRAN) injection 4 mg  4 mg Intravenous Q8H PRN Bennett, Christal H, NP       pantoprazole (PROTONIX) EC tablet 20 mg  20 mg Oral Daily Bennett, Christal H, NP   20 mg at 10/01/22 0958   QUEtiapine (SEROQUEL) tablet 50 mg  50 mg Oral QHS Bennett, Christal H, NP   50 mg at 09/30/22 2114   sodium chloride (OCEAN) 0.65 % nasal spray 1 spray  1 spray Each Nare PRN Dietrich Pates H, NP   1 spray at 09/30/22 1931    Lab Results:  Results for orders placed or  performed during the hospital  encounter of 09/24/22 (from the past 48 hour(s))  Ammonia     Status: Abnormal   Collection Time: 10/01/22 10:27 AM  Result Value Ref Range   Ammonia 79 (H) 9 - 35 umol/L    Comment: Performed at [facility], [address] [place], [place] [address]  CBC with Differential/Platelet     Status: Abnormal   Collection Time: 10/01/22 10:27 AM  Result Value Ref Range   WBC 2.5 (L) 4.0 - 10.5 K/uL   RBC 3.81 (L) 4.22 - 5.81 MIL/uL   Hemoglobin 11.9 (L) 13.0 - 17.0 g/dL   HCT 36.5 (L) 39.0 - 52.0 %   MCV 95.8 80.0 - 100.0 fL   MCH 31.2 26.0 - 34.0 pg   MCHC 32.6 30.0 - 36.0 g/dL   RDW 17.5 (H) 11.5 - 15.5 %   Platelets 78 (L) 150 - 400 K/uL    Comment: Immature Platelet Fraction may be clinically indicated, consider ordering this additional test [id number]    nRBC 0.0 0.0 - 0.2 %   Neutrophils Relative % 49 %   Neutro Abs 1.2 (L) 1.7 - 7.7 K/uL   Lymphocytes Relative 32 %   Lymphs Abs 0.8 0.7 - 4.0 K/uL   Monocytes Relative 14 %   Monocytes Absolute 0.4 0.1 - 1.0 K/uL   Eosinophils Relative 4 %   Eosinophils Absolute 0.1 0.0 - 0.5 K/uL   Basophils Relative 1 %   Basophils Absolute 0.0 0.0 - 0.1 K/uL   Immature Granulocytes 0 %   Abs Immature Granulocytes 0.01 0.00 - 0.07 K/uL    Comment: Performed at [facility] [address] [place], [place] [address]  Comprehensive metabolic panel     Status: Abnormal   Collection Time: 10/01/22 10:27 AM  Result Value Ref Range   Sodium 139 135 - 145 mmol/L   Potassium 3.9 3.5 - 5.1 mmol/L   Chloride 111 98 - 111 mmol/L   CO2 22 22 - 32 mmol/L   Glucose, Bld 129 (H) 70 - 99 mg/dL    Comment: Glucose reference range applies only to samples taken after fasting for at least 8 hours.   BUN 20 8 - 23 mg/dL   Creatinine, Ser 0.86 0.61 - 1.24 mg/dL   Calcium 9.2 8.9 - 10.3 mg/dL   Total Protein 6.1 (L) 6.5 - 8.1 g/dL   Albumin 2.3 (L) 3.5 - 5.0 g/dL   AST 43 (H) 15 - 41 U/L   ALT 27 0 - 44  U/L   Alkaline Phosphatase 120 38 - 126 U/L   Total Bilirubin 1.2 0.3 - 1.2 mg/dL   GFR, Estimated >60 >60 mL/min    Comment: (NOTE) Calculated using the CKD-EPI Creatinine Equation (2021)    Anion gap 6 5 - 15    Comment: Performed at [facility], [address] [place],  [address]    Blood Alcohol level:  Lab Results  Component Value Date   [facility] <10 09/15/2022   ETH <10 42/35/3614    Metabolic Disorder Labs: Lab Results  Component Value Date   HGBA1C 4.8 09/23/2022   MPG 91.06 09/23/2022   MPG 93.93 09/15/2022   No results found for: "PROLACTIN" Lab Results  Component Value Date   CHOL 140 09/23/2022   TRIG 41 09/23/2022   HDL 40 (L) 09/23/2022   CHOLHDL 3.5 09/23/2022   VLDL 8 09/23/2022   LDLCALC 92 09/23/2022   LDLCALC 57 04/29/2022    Physical Findings: AIMS:  , ,  ,  ,  CIWA:    COWS:     Musculoskeletal: Strength & Muscle Tone: within normal limits Gait & Station: normal Patient leans: N/A  Psychiatric Specialty Exam:  Presentation  General Appearance:  Disheveled  Eye Contact: Good  Speech: Clear and Coherent  Speech Volume: Normal  Handedness: Right   Mood and Affect  Mood: Euphoric  Affect: Congruent   Thought Process  Thought Processes: Coherent  Descriptions of Associations:Intact  Orientation:Full (Time, Place and Person)  Thought Content:WDL  History of Schizophrenia/Schizoaffective disorder:No  Duration of Psychotic Symptoms:Less than six months  Hallucinations:No data recorded Ideas of Reference:None  Suicidal Thoughts:No data recorded Homicidal Thoughts:No data recorded  Sensorium  Memory: Immediate Good; Recent Good  Judgment: Fair  Insight: Poor   Executive Functions  Concentration: Good  Attention Span: Good  Recall: Good  Fund of Knowledge: Poor  Language: Fair   Psychomotor Activity  Psychomotor Activity:No data recorded  Assets  Assets: Financial  Resources/Insurance; Resilience   Sleep  Sleep:No data recorded   Physical Exam: Physical Exam Vitals and nursing note reviewed.  Constitutional:      Appearance: Normal appearance. He is normal weight.  Neurological:     General: No focal deficit present.     Mental Status: He is alert and oriented to person, place, and time.  Psychiatric:        Attention and Perception: Attention and perception normal.        Mood and Affect: Mood is depressed. Affect is flat.        Speech: Speech is delayed.        Behavior: Behavior is slowed. Behavior is cooperative.        Thought Content: Thought content normal.        Cognition and Memory: Cognition is impaired.        Judgment: Judgment normal.    Review of Systems  Constitutional: Negative.   HENT: Negative.    Eyes: Negative.   Respiratory: Negative.    Cardiovascular: Negative.   Gastrointestinal: Negative.   Genitourinary: Negative.   Musculoskeletal: Negative.   Skin: Negative.   Neurological: Negative.   Endo/Heme/Allergies: Negative.   Psychiatric/Behavioral: Negative.     Blood pressure 116/80, pulse 60, temperature 98.2 F (36.8 C), resp. rate 18, height '5\' 6"'$  (1.676 m), weight 111 kg, SpO2 100 %. Body mass index is 39.5 kg/m.   Treatment Plan Summary: Daily contact with patient to assess and evaluate symptoms and progress in treatment, Medication management, and Plan increase lactulose to 3 times a day and next time check hepatitis panel.  Parks Ranger, DO 10/01/2022, 12:20 PM

## 2022-10-01 NOTE — Plan of Care (Signed)
  Problem: Nutrition: Goal: Adequate nutrition will be maintained Outcome: Progressing   Problem: Pain Managment: Goal: General experience of comfort will improve Outcome: Progressing   Problem: Elimination: Goal: Will not experience complications related to bowel motility Outcome: Not Progressing Goal: Will not experience complications related to urinary retention Outcome: Not Progressing

## 2022-10-01 NOTE — Progress Notes (Signed)
Nursing reached out to Dr. Louis Meckel and Dr. Weber Cooks regarding patient's medical status. Dr. Weber Cooks will review the chart and advise accordingly.

## 2022-10-01 NOTE — Group Note (Signed)
[facility] LCSW Group Therapy Note    Group Date: 10/01/2022 Start Time: 1300 End Time: 1400  Type of Therapy and Topic:  Group Therapy:  Overcoming Obstacles  Participation Level:  [facility] PARTICIPATION LEVEL: Did Not Attend  Mood:  Description of Group:   In this group patients will be encouraged to explore what they see as obstacles to their own wellness and recovery. They will be guided to discuss their thoughts, feelings, and behaviors related to these obstacles. The group will process together ways to cope with barriers, with attention given to specific choices patients can make. Each patient will be challenged to identify changes they are motivated to make in order to overcome their obstacles. This group will be process-oriented, with patients participating in exploration of their own experiences as well as giving and receiving support and challenge from other group members.  Therapeutic Goals: 1. Patient will identify personal and current obstacles as they relate to admission. 2. Patient will identify barriers that currently interfere with their wellness or overcoming obstacles.  3. Patient will identify feelings, thought process and behaviors related to these barriers. 4. Patient will identify two changes they are willing to make to overcome these obstacles:    Summary of Patient Progress Patient was asleep.  He did not attend group.    Therapeutic Modalities:   Cognitive Behavioral Therapy Solution Focused Therapy Motivational Interviewing Relapse Prevention Therapy   Rozann Lesches, LCSW

## 2022-10-01 NOTE — Progress Notes (Signed)
Patient presents with mild confusion, unsteady gate, and disheveled in nature. He seems to be 'out of it'. This was not characteristic of patient's presentation or ambulation the last 2 days. Patient needed assistance with consuming food. He also was unaware that he was soiled in urine and feces. Full linen change performed. Complete shower. Clothes removed and washed.  MD ordered lab blood work. Results show an elevated ammonia level. Lactulose med increased in an effort to flush more ammonia out.  He denies SI/HI/AVH. Denies anxiety and depression. Appetite fair today. Denies pain. Medication compliant.  Q15 minute unit checks in place.

## 2022-10-01 NOTE — Progress Notes (Signed)
1940 - D: Pt alert and oriented. Pt denies experiencing any anxiety/depression at this time. Pt denies experiencing any pain at this time. Pt denies experiencing any SI/HI, or AVH at this time.     A: Scheduled medications administered to pt as prescribed. Prn Mucinex and Nasal saline spray offered at 1930 for c/o nasal congestion. Support and encouragement provided. Routine safety checks conducted q15 minutes.   R: No adverse drug reactions noted. Moderate relief reported from offered prn for nasal congestion. Pt verbally contracts for safety at this time. Pt complaint with medications. Pt interacts minimally with others on the unit. Pt remains safe at this time, will continue plan of care.

## 2022-10-01 NOTE — [facility] Group Notes (Signed)
Patient did not attend group tonight

## 2022-10-01 NOTE — BH IP Treatment Plan (Signed)
Interdisciplinary Treatment and Diagnostic Plan Update  10/01/2022 Time of Session: 9:15AM [name] MRN: [id number]  Principal Diagnosis: MDD (major depressive disorder)  Secondary Diagnoses: Principal Problem:   MDD (major depressive disorder)   Current Medications:  Current Facility-Administered Medications  Medication Dose Route Frequency Provider Last Rate Last Admin   acetaminophen (TYLENOL) tablet 650 mg  650 mg Oral Q4H PRN Bennett, Christal H, NP   650 mg at 09/28/22 1820   Or   acetaminophen (TYLENOL) 160 MG/5ML solution 650 mg  650 mg Per Tube Q4H PRN Bennett, Christal H, NP       Or   acetaminophen (TYLENOL) suppository 650 mg  650 mg Rectal Q4H PRN Bennett, Christal H, NP       albuterol (PROVENTIL) (2.5 MG/3ML) 0.083% nebulizer solution 2.5 mg  2.5 mg Nebulization Q4H PRN Bennett, Christal H, NP       aspirin suppository 300 mg  300 mg Rectal Daily Bennett, Christal H, NP       Or   aspirin tablet 325 mg  325 mg Oral Daily Bennett, Christal H, NP   325 mg at 09/30/22 0934   atorvastatin (LIPITOR) tablet 40 mg  40 mg Oral Daily Bennett, Christal H, NP   40 mg at 09/30/22 0934   citalopram (CELEXA) tablet 20 mg  20 mg Oral Daily Bennett, Christal H, NP   20 mg at 09/30/22 0934   dextromethorphan-guaiFENesin (MUCINEX DM) 30-600 MG per 12 hr tablet 1 tablet  1 tablet Oral BID PRN Dietrich Pates H, NP   1 tablet at 09/30/22 1940   doxepin (SINEQUAN) capsule 50 mg  50 mg Oral QHS Parks Ranger, DO   50 mg at 09/30/22 2114   enoxaparin (LOVENOX) injection 55 mg  0.5 mg/kg Subcutaneous Q24H Bennett, Christal H, NP   55 mg at 09/30/22 2111   hydrALAZINE (APRESOLINE) injection 5 mg  5 mg Intravenous Q2H PRN Bennett, Christal H, NP       hydrOXYzine (ATARAX) tablet 10 mg  10 mg Oral TID PRN Richardson Landry, Christal H, NP   10 mg at 09/29/22 2058   lactulose (CHRONULAC) 10 GM/15ML solution 30 g  30 g Oral BID Bennett, Christal H, NP   30 g at 09/30/22 2114   losartan  (COZAAR) tablet 50 mg  50 mg Oral QPC breakfast Parks Ranger, DO   50 mg at 09/30/22 0934   nitroGLYCERIN (NITROSTAT) SL tablet 0.4 mg  0.4 mg Sublingual Q5 Min x 3 PRN Bennett, Christal H, NP       ondansetron (ZOFRAN) injection 4 mg  4 mg Intravenous Q8H PRN Bennett, Christal H, NP       pantoprazole (PROTONIX) EC tablet 20 mg  20 mg Oral Daily Bennett, Christal H, NP   20 mg at 09/30/22 0934   QUEtiapine (SEROQUEL) tablet 50 mg  50 mg Oral QHS Bennett, Christal H, NP   50 mg at 09/30/22 2114   sodium chloride (OCEAN) 0.65 % nasal spray 1 spray  1 spray Each Nare PRN Dietrich Pates H, NP   1 spray at 09/30/22 1931   PTA Medications: Medications Prior to Admission  Medication Sig Dispense Refill Last Dose   albuterol (PROVENTIL) (2.5 MG/3ML) 0.083% nebulizer solution Take 3 mLs by nebulization every 4 (four) hours as needed for wheezing or shortness of breath. (Patient not taking: Reported on 09/23/2022) 75 mL 0    albuterol (VENTOLIN HFA) 108 (90 Base) MCG/ACT inhaler Inhale 2 puffs into  the lungs every 6 (six) hours as needed for wheezing or shortness of breath. (Patient not taking: Reported on 09/17/2022) 1 each 5    ascorbic acid (VITAMIN C) 500 MG tablet Take 1 tablet (500 mg total) by mouth daily.      aspirin EC 81 MG tablet Take 1 tablet (81 mg total) by mouth daily. Swallow whole. 30 tablet 5    atorvastatin (LIPITOR) 40 MG tablet Take 1 tablet (40 mg total) by mouth daily. (Patient not taking: Reported on 09/15/2022) 30 tablet 5    citalopram (CELEXA) 20 MG tablet Take 1 tablet (20 mg total) by mouth daily. 30 tablet 3    furosemide (LASIX) 20 MG tablet Take 20 mg by mouth every morning.      glucosamine-chondroitin 500-400 MG tablet Take 1 tablet by mouth 3 (three) times daily.      hydrOXYzine (ATARAX) 10 MG tablet Take 10 mg by mouth 3 (three) times daily as needed.      JARDIANCE 10 MG TABS tablet Take 1 tablet (10 mg total) by mouth daily. 30 tablet 5    lactulose  (CHRONULAC) 10 GM/15ML solution Take 45 mLs (30 g total) by mouth 2 (two) times daily. 236 mL 0    lansoprazole (PREVACID) 30 MG capsule Take 30 mg by mouth daily.      lisinopril (ZESTRIL) 40 MG tablet Take 1 tablet (40 mg total) by mouth daily. 30 tablet 5    metFORMIN (GLUCOPHAGE-XR) 500 MG 24 hr tablet Take 1 tablet (500 mg total) by mouth daily. 30 tablet 5    Multiple Vitamin (MULTIVITAMIN WITH MINERALS) TABS tablet Take 1 tablet by mouth daily.      nitroGLYCERIN (NITROSTAT) 0.4 MG SL tablet Place 1 tablet (0.4 mg total) under the tongue every 5 (five) minutes x 3 doses as needed for chest pain. 30 tablet 5    QUEtiapine (SEROQUEL) 50 MG tablet Take 1 tablet (50 mg total) by mouth at bedtime. 30 tablet 3     Patient Stressors: Financial difficulties   Health problems   Medication change or noncompliance   Other: homelessness    Patient Strengths: Ability for insight  Active sense of humor  Communication skills  Motivation for treatment/growth   Treatment Modalities: Medication Management, Group therapy, Case management,  1 to 1 session with clinician, Psychoeducation, Recreational therapy.   Physician Treatment Plan for Primary Diagnosis: MDD (major depressive disorder) Long Term Goal(s): Improvement in symptoms so as ready for discharge   Short Term Goals: Ability to identify changes in lifestyle to reduce recurrence of condition will improve Ability to verbalize feelings will improve Ability to disclose and discuss suicidal ideas Ability to demonstrate self-control will improve Ability to identify and develop effective coping behaviors will improve Ability to maintain clinical measurements within normal limits will improve Compliance with prescribed medications will improve Ability to identify triggers associated with substance abuse/mental health issues will improve  Medication Management: Evaluate patient's response, side effects, and tolerance of medication  regimen.  Therapeutic Interventions: 1 to 1 sessions, Unit Group sessions and Medication administration.  Evaluation of Outcomes: Progressing  Physician Treatment Plan for Secondary Diagnosis: Principal Problem:   MDD (major depressive disorder)  Long Term Goal(s): Improvement in symptoms so as ready for discharge   Short Term Goals: Ability to identify changes in lifestyle to reduce recurrence of condition will improve Ability to verbalize feelings will improve Ability to disclose and discuss suicidal ideas Ability to demonstrate self-control will improve Ability to  identify and develop effective coping behaviors will improve Ability to maintain clinical measurements within normal limits will improve Compliance with prescribed medications will improve Ability to identify triggers associated with substance abuse/mental health issues will improve     Medication Management: Evaluate patient's response, side effects, and tolerance of medication regimen.  Therapeutic Interventions: 1 to 1 sessions, Unit Group sessions and Medication administration.  Evaluation of Outcomes: Progressing   RN Treatment Plan for Primary Diagnosis: MDD (major depressive disorder) Long Term Goal(s): Knowledge of disease and therapeutic regimen to maintain health will improve  Short Term Goals: Ability to remain free from injury will improve, Ability to demonstrate self-control, Ability to participate in decision making will improve, Ability to verbalize feelings will improve, Ability to disclose and discuss suicidal ideas, Ability to identify and develop effective coping behaviors will improve, and Compliance with prescribed medications will improve  Medication Management: RN will administer medications as ordered by provider, will assess and evaluate patient's response and provide education to patient for prescribed medication. RN will report any adverse and/or side effects to prescribing  provider.  Therapeutic Interventions: 1 on 1 counseling sessions, Psychoeducation, Medication administration, Evaluate responses to treatment, Monitor vital signs and CBGs as ordered, Perform/monitor CIWA, COWS, AIMS and Fall Risk screenings as ordered, Perform wound care treatments as ordered.  Evaluation of Outcomes: Progressing   LCSW Treatment Plan for Primary Diagnosis: MDD (major depressive disorder) Long Term Goal(s): Safe transition to appropriate next level of care at discharge, Engage patient in therapeutic group addressing interpersonal concerns.  Short Term Goals: Engage patient in aftercare planning with referrals and resources, Increase social support, Increase ability to appropriately verbalize feelings, Increase emotional regulation, Facilitate acceptance of mental health diagnosis and concerns, and Increase skills for wellness and recovery  Therapeutic Interventions: Assess for all discharge needs, 1 to 1 time with Social worker, Explore available resources and support systems, Assess for adequacy in community support network, Educate family and significant other(s) on suicide prevention, Complete Psychosocial Assessment, Interpersonal group therapy.  Evaluation of Outcomes: Progressing   Progress in Treatment: Attending groups: Yes. Participating in groups: Yes. Taking medication as prescribed: Yes. Toleration medication: Yes. Family/Significant other contact made: Yes, individual(s) contacted:  SPE completed with the patient's step-daughter.  Patient understands diagnosis: Yes. Discussing patient identified problems/goals with staff: Yes. Medical problems stabilized or resolved: Yes. Denies suicidal/homicidal ideation: Yes. Issues/concerns per patient self-inventory: Yes. Other: none  New problem(s) identified: No, Describe:  none  Update 10/01/2022:  No changes at this time.    New Short Term/Long Term Goal(s): medication management for mood stabilization;  elimination of SI thoughts; development of comprehensive mental wellness/sobriety plan.  Update 10/01/2022:  No changes at this time.    Patient Goals:  "get my medicine adjusted and get stronger"  Update 10/01/2022:  No changes at this time.    Discharge Plan or Barriers: Patient reports plans to return to his tent or stay in a hotel at discharge.  He reports that he is looking forward to a referral for psychiatry and therapy at discharge.  Update 10/01/2022:  Patient continues to progress on the unit.  At this time patient reports plans to return to his tent/homeless shelter.    Reason for Continuation of Hospitalization: Aggression Anxiety Depression Hallucinations Medication stabilization Suicidal ideation   Estimated Length of Stay:  1-7 days  Update 10/01/2022:  TBD   Last 3 [place] Suicide Severity Risk Score: Flowsheet Row Admission (Current) from 09/24/2022 in [place] Admission (Discharged)  from 09/17/2022 in [place] ED from 09/15/2022 in [facility] Emergency Department at [place] No Risk No Risk No Risk       Last PHQ 2/9 Scores:    04/26/2022   11:03 AM 04/26/2022   11:02 AM 01/30/2022   12:58 PM  Depression screen PHQ 2/9  Decreased Interest 3 0 0  Down, Depressed, Hopeless 3 1 0  PHQ - 2 Score 6 1 0  Altered sleeping 3    Tired, decreased energy 3    Change in appetite 3    Feeling bad or failure about yourself  1    Trouble concentrating 2    Moving slowly or fidgety/restless 0    Suicidal thoughts 0    PHQ-9 Score 18    Difficult doing work/chores Somewhat difficult      Scribe for Treatment Team: Rozann Lesches, LCSW 10/01/2022 9:14 AM

## 2022-10-02 LAB — COMPREHENSIVE METABOLIC PANEL
ALT: 27 U/L (ref 0–44)
AST: 44 U/L — ABNORMAL HIGH (ref 15–41)
Albumin: 2.4 g/dL — ABNORMAL LOW (ref 3.5–5.0)
Alkaline Phosphatase: 111 U/L (ref 38–126)
Anion gap: 6 (ref 5–15)
BUN: 20 mg/dL (ref 8–23)
CO2: 23 mmol/L (ref 22–32)
Calcium: 9.3 mg/dL (ref 8.9–10.3)
Chloride: 111 mmol/L (ref 98–111)
Creatinine, Ser: 0.92 mg/dL (ref 0.61–1.24)
GFR, Estimated: >60 mL/min (ref >60)
Glucose, Bld: 81 mg/dL (ref 70–99)
Potassium: 3.7 mmol/L (ref 3.5–5.1)
Sodium: 140 mmol/L (ref 135–145)
Total Bilirubin: 1.3 mg/dL — ABNORMAL HIGH (ref 0.3–1.2)
Total Protein: 6.3 g/dL — ABNORMAL LOW (ref 6.5–8.1)

## 2022-10-02 LAB — AMMONIA: Ammonia: 48 umol/L — ABNORMAL HIGH (ref 9–35)

## 2022-10-02 NOTE — Plan of Care (Signed)
  Problem: Clinical Measurements: Goal: Diagnostic test results will improve Outcome: Progressing   Problem: Nutrition: Goal: Adequate nutrition will be maintained Outcome: Progressing   Problem: Coping: Goal: Level of anxiety will decrease Outcome: Progressing

## 2022-10-02 NOTE — [facility] Group Notes (Signed)
PsychoEducational Group Note- Patients were given poem by Denice Paradise Camacho-psychotherapist to read, titled '' the owl [email or web address] the chimpanzee ''  Poem uses techniques of cognitive behavioral therapy to identify ways in which we react with fear and anxiety,versus staying calm and using judgement to help situations.  Patient attended and shared that '' I wasn't myself before coming in here, I was just flat lined out of my mind. But I feel better now, I'm just tired.''

## 2022-10-02 NOTE — Group Note (Signed)
LCSW Group Therapy Note  Group Date: 10/02/2022 Start Time: 1330 End Time: 1345   Type of Therapy and Topic:  Group Therapy - How To Cope with Nervousness about Discharge   Participation Level:  Did Not Attend   Description of Group This process group involved identification of patients' feelings about discharge. Some of them are scheduled to be discharged soon, while others are new admissions, but each of them was asked to share thoughts and feelings surrounding discharge from the hospital. One common theme was that they are excited at the prospect of going home, while another was that many of them are apprehensive about sharing why they were hospitalized. Patients were given the opportunity to discuss these feelings with their peers in preparation for discharge.  Therapeutic Goals  Patient will identify their overall feelings about pending discharge. Patient will think about how they might proactively address issues that they believe will once again arise once they get home (i.e. with parents). Patients will participate in discussion about having hope for change.   Summary of Patient Progress:   CSW attempted to invite patient to group.  Patient was asleep.  CSW did not wake patient.     Therapeutic Modalities Cognitive Behavioral Therapy   Rozann Lesches, LCSWA 10/02/2022  2:02 PM

## 2022-10-02 NOTE — Progress Notes (Signed)
Patient stable and in good condition. Pt appears to be confused intermittently requiring constant redirection. Patient had an episode of medium diarrhea after receiving his second dose of lactulose and went back to sleep. Bed alarm is in place and on. staff will continue to monitor patient for safety and assist patient with his ADLs

## 2022-10-02 NOTE — [facility] Group Notes (Signed)
Community Meeting/Goals Group Pt attended and shared '' I am doing better. I feel good, just still tired. ''  Pt also participated in stretching and exercises. Pt voiced no acute concerns for MD, stating '' I think he is doing a good job. '' Pt denies any SI/HI or goals for group today.

## 2022-10-02 NOTE — Progress Notes (Signed)
[facility] MD Progress Note  10/02/2022 10:06 AM Drew MATUSEK  MRN:  [id number] Subjective: [name] is seen on rounds.  He is more alert and cognitively better.  He has been receiving lactulose for his elevated ammonia which has come down to 49.  He does have a history of hepatitis C I noticed in his chart.  He does not have any psychiatric or medical follow-up so I discussed this with social work.  His mood is better and he is tolerating medications without any side effects.  Working on discharge planning. Principal Problem: MDD (major depressive disorder) Diagnosis: Principal Problem:   MDD (major depressive disorder)  Total Time spent with patient: 15 minutes  Past Psychiatric History: Depression  Past Medical History:  Past Medical History:  Diagnosis Date   Asthma    CHF (congestive heart failure) (HCC)    COPD (chronic obstructive pulmonary disease) ([place])    COVID-19 03/2020   diagnosed in August 2021   Diabetes mellitus without complication ([place])    Homelessness    Hypertension    Infestation by bed bug    Migraine    Obesity    Sleep apnea     Past Surgical History:  Procedure Laterality Date   CARDIAC CATHETERIZATION     CHOLECYSTECTOMY     EYE SURGERY     INNER EAR SURGERY     NOSE SURGERY     Family History:  Family History  Problem Relation Age of Onset   Hypertension Mother    Heart disease Mother    Heart failure Maternal Grandmother    Family Psychiatric  History: Unremarkable Social History:  Social History   Substance and Sexual Activity  Alcohol Use Never     Social History   Substance and Sexual Activity  Drug Use Never    Social History   Socioeconomic History   Marital status: Single    Spouse name: Not on file   Number of children: 2   Years of education: 12   Highest education level: 12th grade  Occupational History   Occupation: retired  Tobacco Use   Smoking status: Former   Smokeless tobacco: Never  Scientific laboratory technician Use: Not on  file  Substance and Sexual Activity   Alcohol use: Never   Drug use: Never   Sexual activity: Never    Birth control/protection: None  Other Topics Concern   Not on file  Social History Narrative   Not on file   Social Determinants of Health   Financial Resource Strain: High Risk (04/27/2022)   Overall Financial Resource Strain (CARDIA)    Difficulty of Paying Living Expenses: Very hard  Food Insecurity: No Food Insecurity (09/24/2022)   Hunger Vital Sign    Worried About Running Out of Food in the Last Year: Never true    Ran Out of Food in the Last Year: Never true  Transportation Needs: No Transportation Needs (09/24/2022)   PRAPARE - Hydrologist (Medical): No    Lack of Transportation (Non-Medical): No  Physical Activity: Sufficiently Active (04/16/2018)   Exercise Vital Sign    Days of Exercise per Week: 7 days    Minutes of Exercise per Session: 30 min  Stress: Stress Concern Present (04/16/2018)   [place]    Feeling of Stress : Very much  Social Connections: Moderately Isolated (04/30/2018)   Social Connection and Isolation Panel [NHANES]    Frequency  of Communication with Friends and Family: Twice a week    Frequency of Social Gatherings with Friends and Family: Never    Attends Religious Services: More than 4 times per year    Active Member of [employer] or Organizations: No    Attends Archivist Meetings: Never    Marital Status: Widowed   Additional Social History:                         Sleep: Good  Appetite:  Good  Current Medications: Current Facility-Administered Medications  Medication Dose Route Frequency Provider Last Rate Last Admin   acetaminophen (TYLENOL) tablet 650 mg  650 mg Oral Q4H PRN Bennett, Christal H, NP   650 mg at 09/28/22 1820   Or   acetaminophen (TYLENOL) 160 MG/5ML solution 650 mg  650 mg Per Tube Q4H PRN Bennett, Christal  H, NP       Or   acetaminophen (TYLENOL) suppository 650 mg  650 mg Rectal Q4H PRN Bennett, Christal H, NP       albuterol (PROVENTIL) (2.5 MG/3ML) 0.083% nebulizer solution 2.5 mg  2.5 mg Nebulization Q4H PRN Bennett, Christal H, NP       aspirin suppository 300 mg  300 mg Rectal Daily Bennett, Christal H, NP       Or   aspirin tablet 325 mg  325 mg Oral Daily Bennett, Christal H, NP   325 mg at 10/01/22 0958   atorvastatin (LIPITOR) tablet 40 mg  40 mg Oral Daily Bennett, Christal H, NP   40 mg at 10/01/22 0958   citalopram (CELEXA) tablet 20 mg  20 mg Oral Daily Bennett, Christal H, NP   20 mg at 10/01/22 0958   dextromethorphan-guaiFENesin ([place] DM) 30-600 MG per 12 hr tablet 1 tablet  1 tablet Oral BID PRN Dietrich Pates H, NP   1 tablet at 09/30/22 1940   doxepin (SINEQUAN) capsule 50 mg  50 mg Oral QHS Parks Ranger, DO   50 mg at 10/01/22 2104   enoxaparin (LOVENOX) injection 55 mg  0.5 mg/kg Subcutaneous Q24H Bennett, Christal H, NP   55 mg at 10/01/22 2031   hydrALAZINE (APRESOLINE) injection 5 mg  5 mg Intravenous Q2H PRN Bennett, Christal H, NP       hydrOXYzine (ATARAX) tablet 10 mg  10 mg Oral TID PRN Richardson Landry, Christal H, NP   10 mg at 10/02/22 0129   lactulose (CHRONULAC) 10 GM/15ML solution 30 g  30 g Oral QID Parks Ranger, DO   30 g at 10/02/22 0347   losartan (COZAAR) tablet 50 mg  50 mg Oral QPC breakfast Parks Ranger, DO   50 mg at 10/01/22 1610   nitroGLYCERIN (NITROSTAT) SL tablet 0.4 mg  0.4 mg Sublingual Q5 Min x 3 PRN Bennett, Christal H, NP       ondansetron (ZOFRAN) injection 4 mg  4 mg Intravenous Q8H PRN Bennett, Christal H, NP       pantoprazole (PROTONIX) EC tablet 20 mg  20 mg Oral Daily Bennett, Christal H, NP   20 mg at 10/01/22 0958   QUEtiapine (SEROQUEL) tablet 50 mg  50 mg Oral QHS Bennett, Christal H, NP   50 mg at 10/01/22 2104   sodium chloride (OCEAN) 0.65 % nasal spray 1 spray  1 spray Each Nare PRN Dietrich Pates  H, NP   1 spray at 09/30/22 1931    Lab Results:  Results for  orders placed or performed during the hospital encounter of 09/24/22 (from the past 48 hour(s))  Ammonia     Status: Abnormal   Collection Time: 10/01/22 10:27 AM  Result Value Ref Range   Ammonia 79 (H) 9 - 35 umol/L    Comment: Performed at [facility], [address] [place], [place] [address]  CBC with Differential/Platelet     Status: Abnormal   Collection Time: 10/01/22 10:27 AM  Result Value Ref Range   WBC 2.5 (L) 4.0 - 10.5 K/uL   RBC 3.81 (L) 4.22 - 5.81 MIL/uL   Hemoglobin 11.9 (L) 13.0 - 17.0 g/dL   HCT 36.5 (L) 39.0 - 52.0 %   MCV 95.8 80.0 - 100.0 fL   MCH 31.2 26.0 - 34.0 pg   MCHC 32.6 30.0 - 36.0 g/dL   RDW 17.5 (H) 11.5 - 15.5 %   Platelets 78 (L) 150 - 400 K/uL    Comment: Immature Platelet Fraction may be clinically indicated, consider ordering this additional test [id number]    nRBC 0.0 0.0 - 0.2 %   Neutrophils Relative % 49 %   Neutro Abs 1.2 (L) 1.7 - 7.7 K/uL   Lymphocytes Relative 32 %   Lymphs Abs 0.8 0.7 - 4.0 K/uL   Monocytes Relative 14 %   Monocytes Absolute 0.4 0.1 - 1.0 K/uL   Eosinophils Relative 4 %   Eosinophils Absolute 0.1 0.0 - 0.5 K/uL   Basophils Relative 1 %   Basophils Absolute 0.0 0.0 - 0.1 K/uL   Immature Granulocytes 0 %   Abs Immature Granulocytes 0.01 0.00 - 0.07 K/uL    Comment: Performed at [facility], [address] [place], [place] [address]  Comprehensive metabolic panel     Status: Abnormal   Collection Time: 10/01/22 10:27 AM  Result Value Ref Range   Sodium 139 135 - 145 mmol/L   Potassium 3.9 3.5 - 5.1 mmol/L   Chloride 111 98 - 111 mmol/L   CO2 22 22 - 32 mmol/L   Glucose, Bld 129 (H) 70 - 99 mg/dL    Comment: Glucose reference range applies only to samples taken after fasting for at least 8 hours.   BUN 20 8 - 23 mg/dL   Creatinine, Ser 0.86 0.61 - 1.24 mg/dL   Calcium 9.2 8.9 - 10.3 mg/dL   Total Protein 6.1 (L) 6.5 - 8.1  g/dL   Albumin 2.3 (L) 3.5 - 5.0 g/dL   AST 43 (H) 15 - 41 U/L   ALT 27 0 - 44 U/L   Alkaline Phosphatase 120 38 - 126 U/L   Total Bilirubin 1.2 0.3 - 1.2 mg/dL   GFR, Estimated >60 >60 mL/min    Comment: (NOTE) Calculated using the CKD-EPI Creatinine Equation (2021)    Anion gap 6 5 - 15    Comment: Performed at [facility], [name] [place],  [address]  Comprehensive metabolic panel     Status: Abnormal   Collection Time: 10/02/22  7:55 AM  Result Value Ref Range   Sodium 140 135 - 145 mmol/L   Potassium 3.7 3.5 - 5.1 mmol/L   Chloride 111 98 - 111 mmol/L   CO2 23 22 - 32 mmol/L   Glucose, Bld 81 70 - 99 mg/dL    Comment: Glucose reference range applies only to samples taken after fasting for at least 8 hours.   BUN 20 8 - 23 mg/dL   Creatinine, Ser 0.92 0.61 -  1.24 mg/dL   Calcium 9.3 8.9 - 10.3 mg/dL   Total Protein 6.3 (L) 6.5 - 8.1 g/dL   Albumin 2.4 (L) 3.5 - 5.0 g/dL   AST 44 (H) 15 - 41 U/L   ALT 27 0 - 44 U/L   Alkaline Phosphatase 111 38 - 126 U/L   Total Bilirubin 1.3 (H) 0.3 - 1.2 mg/dL   GFR, Estimated >60 >60 mL/min    Comment: (NOTE) Calculated using the CKD-EPI Creatinine Equation (2021)    Anion gap 6 5 - 15    Comment: Performed at [facility], [address] [place], [place] [address]  Ammonia     Status: Abnormal   Collection Time: 10/02/22  7:55 AM  Result Value Ref Range   Ammonia 48 (H) 9 - 35 umol/L    Comment: Performed at [facility], [address] [place], [place] [address]    Blood Alcohol level:  Lab Results  Component Value Date   [facility] <10 09/15/2022   ETH <10 13/24/4010    Metabolic Disorder Labs: Lab Results  Component Value Date   HGBA1C 4.8 09/23/2022   MPG 91.06 09/23/2022   MPG 93.93 09/15/2022   No results found for: "PROLACTIN" Lab Results  Component Value Date   CHOL 140 09/23/2022   TRIG 41 09/23/2022   HDL 40 (L) 09/23/2022   CHOLHDL 3.5 09/23/2022   VLDL 8  09/23/2022   LDLCALC 92 09/23/2022   LDLCALC 57 04/29/2022    Physical Findings: AIMS:  , ,  ,  ,    CIWA:    COWS:     Musculoskeletal: Strength & Muscle Tone: within normal limits Gait & Station: normal Patient leans: N/A  Psychiatric Specialty Exam:  Presentation  General Appearance:  Disheveled  Eye Contact: Good  Speech: Clear and Coherent  Speech Volume: Normal  Handedness: Right   Mood and Affect  Mood: Euphoric  Affect: Congruent   Thought Process  Thought Processes: Coherent  Descriptions of Associations:Intact  Orientation:Full (Time, Place and Person)  Thought Content:WDL  History of Schizophrenia/Schizoaffective disorder:No  Duration of Psychotic Symptoms:Less than six months  Hallucinations:No data recorded Ideas of Reference:None  Suicidal Thoughts:No data recorded Homicidal Thoughts:No data recorded  Sensorium  Memory: Immediate Good; Recent Good  Judgment: Fair  Insight: Poor   Executive Functions  Concentration: Good  Attention Span: Good  Recall: Good  Fund of Knowledge: Poor  Language: Fair   Psychomotor Activity  Psychomotor Activity:No data recorded  Assets  Assets: Financial Resources/Insurance; Resilience   Sleep  Sleep:No data recorded   Physical Exam: Physical Exam Vitals and nursing note reviewed.  Constitutional:      Appearance: Normal appearance. He is normal weight.  Neurological:     General: No focal deficit present.     Mental Status: He is alert and oriented to person, place, and time.  Psychiatric:        Attention and Perception: Attention normal.        Mood and Affect: Mood is anxious and depressed.        Speech: Speech normal.        Behavior: Behavior normal. Behavior is cooperative.        Thought Content: Thought content normal.        Cognition and Memory: Cognition and memory normal.        Judgment: Judgment normal.    Review of Systems   Constitutional: Negative.   HENT: Negative.    Eyes:  Negative.   Respiratory: Negative.    Cardiovascular: Negative.   Gastrointestinal: Negative.   Genitourinary: Negative.   Musculoskeletal: Negative.   Skin: Negative.   Neurological: Negative.   Endo/Heme/Allergies: Negative.   Psychiatric/Behavioral: Negative.     Blood pressure (!) 163/81, pulse (!) 53, temperature 98.6 F (37 C), temperature source Oral, resp. rate (!) 22, height '5\' 6"'$  (1.676 m), weight 111 kg, SpO2 100 %. Body mass index is 39.5 kg/m.   Treatment Plan Summary: Daily contact with patient to assess and evaluate symptoms and progress in treatment, Medication management, and Plan continue current medications.  [place], DO 10/02/2022, 10:06 AM

## 2022-10-02 NOTE — Progress Notes (Signed)
Patient is A+O x4. He denies SI/HI/AVH. He denies pain. He denies anxiety and depression. Patient is less confused and less unsteady than he was yesterday which is a good indication since his ammonia level has went from 79 to 48.   Patient is continuing to ambulate with a walker which he is compliant. Mood appropriate/pleasant. He takes his medications without any issues. He attended morning group and will appear for meals and snacks but prefers to isolate in his room. Lab work scheduled again in the morning.  Q15 minute checks in place.

## 2022-10-03 LAB — COMPREHENSIVE METABOLIC PANEL
ALT: 28 U/L (ref 0–44)
AST: 42 U/L — ABNORMAL HIGH (ref 15–41)
Albumin: 2.4 g/dL — ABNORMAL LOW (ref 3.5–5.0)
Alkaline Phosphatase: 105 U/L (ref 38–126)
Anion gap: 2 — ABNORMAL LOW (ref 5–15)
BUN: 20 mg/dL (ref 8–23)
CO2: 26 mmol/L (ref 22–32)
Calcium: 9.2 mg/dL (ref 8.9–10.3)
Chloride: 112 mmol/L — ABNORMAL HIGH (ref 98–111)
Creatinine, Ser: 0.99 mg/dL (ref 0.61–1.24)
GFR, Estimated: >60 mL/min (ref >60)
Glucose, Bld: 86 mg/dL (ref 70–99)
Potassium: 4.1 mmol/L (ref 3.5–5.1)
Sodium: 140 mmol/L (ref 135–145)
Total Bilirubin: 1.4 mg/dL — ABNORMAL HIGH (ref 0.3–1.2)
Total Protein: 6 g/dL — ABNORMAL LOW (ref 6.5–8.1)

## 2022-10-03 LAB — CBC WITH DIFFERENTIAL/PLATELET
Abs Immature Granulocytes: 0.01 10*3/uL (ref 0.00–0.07)
Basophils Absolute: 0 10*3/uL (ref 0.0–0.1)
Basophils Relative: 1 %
Eosinophils Absolute: 0.1 10*3/uL (ref 0.0–0.5)
Eosinophils Relative: 6 %
HCT: 33.2 % — ABNORMAL LOW (ref 39.0–52.0)
Hemoglobin: 11 g/dL — ABNORMAL LOW (ref 13.0–17.0)
Immature Granulocytes: 0 %
Lymphocytes Relative: 39 %
Lymphs Abs: 0.9 10*3/uL (ref 0.7–4.0)
MCH: 31.4 pg (ref 26.0–34.0)
MCHC: 33.1 g/dL (ref 30.0–36.0)
MCV: 94.9 fL (ref 80.0–100.0)
Monocytes Absolute: 0.3 10*3/uL (ref 0.1–1.0)
Monocytes Relative: 14 %
Neutro Abs: 1 10*3/uL — ABNORMAL LOW (ref 1.7–7.7)
Neutrophils Relative %: 40 %
Platelets: 78 10*3/uL — ABNORMAL LOW (ref 150–400)
RBC: 3.5 MIL/uL — ABNORMAL LOW (ref 4.22–5.81)
RDW: 17.7 % — ABNORMAL HIGH (ref 11.5–15.5)
WBC: 2.4 10*3/uL — ABNORMAL LOW (ref 4.0–10.5)
nRBC: 0 % (ref 0.0–0.2)

## 2022-10-03 LAB — HEPATITIS PANEL, ACUTE
HCV Ab: REACTIVE — AB
Hep A IgM: NONREACTIVE
Hep B C IgM: NONREACTIVE
Hepatitis B Surface Ag: REACTIVE — AB

## 2022-10-03 LAB — AMMONIA: Ammonia: 53 umol/L — ABNORMAL HIGH (ref 9–35)

## 2022-10-03 NOTE — Progress Notes (Signed)
Patient is alert and oriented x4. He reports a headache, which he rates as an 8/10. He denies SI, HI, and AVH. He appears anxious about discharge.   Patient is ambulating with a walker. He appears to be steady while walking in the hallway and bathroom. He is observed interacting appropriately with others. He is compliant with scheduled medications. Patient remains safe on the unit at this time.

## 2022-10-03 NOTE — [facility] Group Notes (Signed)
Patient attended and engaged in group discussion

## 2022-10-03 NOTE — Progress Notes (Signed)
[facility] MD Progress Note  10/03/2022 9:43 AM [name]  MRN:  [id number] Subjective: [name] is seen on rounds.  His ammonia level went up again a little bit today.  Cognitively he is still doing okay.  I spoke with the hospitalist and had admitted him last time who said that he needs to follow up outpatient unless tomorrow's level goes up again and I already increased his lactulose.  He is no longer suicidal.  He says cannot live with his daughter and his daughter's boyfriend.  He has a PCP, Dr. Posey [name] in [place].  He does not have a psychiatric provider due to his insurance and history of being on the sex offender list.  Principal Problem: MDD (major depressive disorder) Diagnosis: Principal Problem:   MDD (major depressive disorder)  Total Time spent with patient: 15 minutes  Past Psychiatric History: Depression  Past Medical History:  Past Medical History:  Diagnosis Date   Asthma    CHF (congestive heart failure) (HCC)    COPD (chronic obstructive pulmonary disease) ([place])    COVID-19 03/2020   diagnosed in August 2021   Diabetes mellitus without complication ([place])    Homelessness    Hypertension    Infestation by bed bug    Migraine    Obesity    Sleep apnea     Past Surgical History:  Procedure Laterality Date   CARDIAC CATHETERIZATION     CHOLECYSTECTOMY     EYE SURGERY     INNER EAR SURGERY     NOSE SURGERY     Family History:  Family History  Problem Relation Age of Onset   Hypertension Mother    Heart disease Mother    Heart failure Maternal Grandmother    Family Psychiatric  History: Daughter with mental illness Social History:  Social History   Substance and Sexual Activity  Alcohol Use Never     Social History   Substance and Sexual Activity  Drug Use Never    Social History   Socioeconomic History   Marital status: Single    Spouse name: Not on file   Number of children: 2   Years of education: 12   Highest education level: 12th grade   Occupational History   Occupation: retired  Tobacco Use   Smoking status: Former   Smokeless tobacco: Never  Scientific laboratory technician Use: Not on file  Substance and Sexual Activity   Alcohol use: Never   Drug use: Never   Sexual activity: Never    Birth control/protection: None  Other Topics Concern   Not on file  Social History Narrative   Not on file   Social Determinants of Health   Financial Resource Strain: High Risk (04/27/2022)   Overall Financial Resource Strain (CARDIA)    Difficulty of Paying Living Expenses: Very hard  Food Insecurity: No Food Insecurity (09/24/2022)   Hunger Vital Sign    Worried About Running Out of Food in the Last Year: Never true    Ran Out of Food in the Last Year: Never true  Transportation Needs: No Transportation Needs (09/24/2022)   PRAPARE - Hydrologist (Medical): No    Lack of Transportation (Non-Medical): No  Physical Activity: Sufficiently Active (04/16/2018)   Exercise Vital Sign    Days of Exercise per Week: 7 days    Minutes of Exercise per Session: 30 min  Stress: Stress Concern Present (04/16/2018)   [place]  Stress Questionnaire    Feeling of Stress : Very much  Social Connections: Moderately Isolated (04/30/2018)   Social Connection and Isolation Panel [NHANES]    Frequency of Communication with Friends and Family: Twice a week    Frequency of Social Gatherings with Friends and Family: Never    Attends Religious Services: More than 4 times per year    Active Member of [employer] or Organizations: No    Attends Archivist Meetings: Never    Marital Status: Widowed   Additional Social History:                         Sleep: Good  Appetite:  Good  Current Medications: Current Facility-Administered Medications  Medication Dose Route Frequency Provider Last Rate Last Admin   acetaminophen (TYLENOL) tablet 650 mg  650 mg Oral Q4H PRN  Bennett, Christal H, NP   650 mg at 09/28/22 1820   Or   acetaminophen (TYLENOL) 160 MG/5ML solution 650 mg  650 mg Per Tube Q4H PRN Bennett, Christal H, NP       Or   acetaminophen (TYLENOL) suppository 650 mg  650 mg Rectal Q4H PRN Bennett, Christal H, NP       albuterol (PROVENTIL) (2.5 MG/3ML) 0.083% nebulizer solution 2.5 mg  2.5 mg Nebulization Q4H PRN Bennett, Christal H, NP       aspirin suppository 300 mg  300 mg Rectal Daily Bennett, Christal H, NP       Or   aspirin tablet 325 mg  325 mg Oral Daily Bennett, Christal H, NP   325 mg at 10/03/22 0934   atorvastatin (LIPITOR) tablet 40 mg  40 mg Oral Daily Bennett, Christal H, NP   40 mg at 10/03/22 0934   citalopram (CELEXA) tablet 20 mg  20 mg Oral Daily Bennett, Christal H, NP   20 mg at 10/03/22 0934   dextromethorphan-guaiFENesin (MUCINEX DM) 30-600 MG per 12 hr tablet 1 tablet  1 tablet Oral BID PRN Dietrich Pates H, NP   1 tablet at 09/30/22 1940   doxepin (SINEQUAN) capsule 50 mg  50 mg Oral QHS Parks Ranger, DO   50 mg at 10/02/22 2138   enoxaparin (LOVENOX) injection 55 mg  0.5 mg/kg Subcutaneous Q24H Bennett, Christal H, NP   55 mg at 10/02/22 2051   hydrALAZINE (APRESOLINE) injection 5 mg  5 mg Intravenous Q2H PRN Bennett, Christal H, NP       hydrOXYzine (ATARAX) tablet 10 mg  10 mg Oral TID PRN Richardson Landry, Christal H, NP   10 mg at 10/02/22 2138   lactulose (CHRONULAC) 10 GM/15ML solution 30 g  30 g Oral QID Parks Ranger, DO   30 g at 10/03/22 0450   losartan (COZAAR) tablet 50 mg  50 mg Oral QPC breakfast Parks Ranger, DO   50 mg at 10/03/22 0935   nitroGLYCERIN (NITROSTAT) SL tablet 0.4 mg  0.4 mg Sublingual Q5 Min x 3 PRN Bennett, Christal H, NP       ondansetron (ZOFRAN) injection 4 mg  4 mg Intravenous Q8H PRN Bennett, Christal H, NP       pantoprazole (PROTONIX) EC tablet 20 mg  20 mg Oral Daily Bennett, Christal H, NP   20 mg at 10/03/22 0935   QUEtiapine (SEROQUEL) tablet 50 mg  50 mg  Oral QHS Bennett, Christal H, NP   50 mg at 10/02/22 2138   sodium chloride (OCEAN) 0.65 %  nasal spray 1 spray  1 spray Each Nare PRN Richardson Landry, Christal H, NP   1 spray at 09/30/22 1931    Lab Results:  Results for orders placed or performed during the hospital encounter of 09/24/22 (from the past 48 hour(s))  Ammonia     Status: Abnormal   Collection Time: 10/01/22 10:27 AM  Result Value Ref Range   Ammonia 79 (H) 9 - 35 umol/L    Comment: Performed at [facility], [address] [place], [place] [address]  CBC with Differential/Platelet     Status: Abnormal   Collection Time: 10/01/22 10:27 AM  Result Value Ref Range   WBC 2.5 (L) 4.0 - 10.5 K/uL   RBC 3.81 (L) 4.22 - 5.81 MIL/uL   Hemoglobin 11.9 (L) 13.0 - 17.0 g/dL   HCT 36.5 (L) 39.0 - 52.0 %   MCV 95.8 80.0 - 100.0 fL   MCH 31.2 26.0 - 34.0 pg   MCHC 32.6 30.0 - 36.0 g/dL   RDW 17.5 (H) 11.5 - 15.5 %   Platelets 78 (L) 150 - 400 K/uL    Comment: Immature Platelet Fraction may be clinically indicated, consider ordering this additional test [id number]    nRBC 0.0 0.0 - 0.2 %   Neutrophils Relative % 49 %   Neutro Abs 1.2 (L) 1.7 - 7.7 K/uL   Lymphocytes Relative 32 %   Lymphs Abs 0.8 0.7 - 4.0 K/uL   Monocytes Relative 14 %   Monocytes Absolute 0.4 0.1 - 1.0 K/uL   Eosinophils Relative 4 %   Eosinophils Absolute 0.1 0.0 - 0.5 K/uL   Basophils Relative 1 %   Basophils Absolute 0.0 0.0 - 0.1 K/uL   Immature Granulocytes 0 %   Abs Immature Granulocytes 0.01 0.00 - 0.07 K/uL    Comment: Performed at [facility], [address] [place], [place] [address]  Comprehensive metabolic panel     Status: Abnormal   Collection Time: 10/01/22 10:27 AM  Result Value Ref Range   Sodium 139 135 - 145 mmol/L   Potassium 3.9 3.5 - 5.1 mmol/L   Chloride 111 98 - 111 mmol/L   CO2 22 22 - 32 mmol/L   Glucose, Bld 129 (H) 70 - 99 mg/dL    Comment: Glucose reference range applies only to samples taken after fasting  for at least 8 hours.   BUN 20 8 - 23 mg/dL   Creatinine, Ser 0.86 0.61 - 1.24 mg/dL   Calcium 9.2 8.9 - 10.3 mg/dL   Total Protein 6.1 (L) 6.5 - 8.1 g/dL   Albumin 2.3 (L) 3.5 - 5.0 g/dL   AST 43 (H) 15 - 41 U/L   ALT 27 0 - 44 U/L   Alkaline Phosphatase 120 38 - 126 U/L   Total Bilirubin 1.2 0.3 - 1.2 mg/dL   GFR, Estimated >60 >60 mL/min    Comment: (NOTE) Calculated using the CKD-EPI Creatinine Equation (2021)    Anion gap 6 5 - 15    Comment: Performed at [facility], [address] [place], [place] [address]  Hepatitis panel, acute     Status: Abnormal   Collection Time: 10/02/22  7:55 AM  Result Value Ref Range   Hepatitis B Surface Ag Reactive (A) NON REACTIVE    Comment: Sample Positive for HBsAg. Result confirmed by neutralization.   HCV Ab Reactive (A) NON REACTIVE    Comment: (NOTE) The CDC recommends that a Reactive HCV antibody result be followed  up  with a HCV Nucleic Acid Amplification test.     Hep A IgM NON REACTIVE NON REACTIVE   Hep B C IgM NON REACTIVE NON REACTIVE    Comment: Performed at [facility], [address], [place], [place] [address]  Comprehensive metabolic panel     Status: Abnormal   Collection Time: 10/02/22  7:55 AM  Result Value Ref Range   Sodium 140 135 - 145 mmol/L   Potassium 3.7 3.5 - 5.1 mmol/L   Chloride 111 98 - 111 mmol/L   CO2 23 22 - 32 mmol/L   Glucose, Bld 81 70 - 99 mg/dL    Comment: Glucose reference range applies only to samples taken after fasting for at least 8 hours.   BUN 20 8 - 23 mg/dL   Creatinine, Ser 0.92 0.61 - 1.24 mg/dL   Calcium 9.3 8.9 - 10.3 mg/dL   Total Protein 6.3 (L) 6.5 - 8.1 g/dL   Albumin 2.4 (L) 3.5 - 5.0 g/dL   AST 44 (H) 15 - 41 U/L   ALT 27 0 - 44 U/L   Alkaline Phosphatase 111 38 - 126 U/L   Total Bilirubin 1.3 (H) 0.3 - 1.2 mg/dL   GFR, Estimated >60 >60 mL/min    Comment: (NOTE) Calculated using the CKD-EPI Creatinine Equation (2021)    Anion gap 6 5 - 15     Comment: Performed at [facility], [address] [place], [place] [address]  Ammonia     Status: Abnormal   Collection Time: 10/02/22  7:55 AM  Result Value Ref Range   Ammonia 48 (H) 9 - 35 umol/L    Comment: Performed at [facility], [address] [place], [place] [address]  CBC with Differential/Platelet     Status: Abnormal   Collection Time: 10/03/22  7:07 AM  Result Value Ref Range   WBC 2.4 (L) 4.0 - 10.5 K/uL   RBC 3.50 (L) 4.22 - 5.81 MIL/uL   Hemoglobin 11.0 (L) 13.0 - 17.0 g/dL   HCT 33.2 (L) 39.0 - 52.0 %   MCV 94.9 80.0 - 100.0 fL   MCH 31.4 26.0 - 34.0 pg   MCHC 33.1 30.0 - 36.0 g/dL   RDW 17.7 (H) 11.5 - 15.5 %   Platelets 78 (L) 150 - 400 K/uL    Comment: Immature Platelet Fraction may be clinically indicated, consider ordering this additional test [id number] REPEATED TO VERIFY    nRBC 0.0 0.0 - 0.2 %   Neutrophils Relative % 40 %   Neutro Abs 1.0 (L) 1.7 - 7.7 K/uL   Lymphocytes Relative 39 %   Lymphs Abs 0.9 0.7 - 4.0 K/uL   Monocytes Relative 14 %   Monocytes Absolute 0.3 0.1 - 1.0 K/uL   Eosinophils Relative 6 %   Eosinophils Absolute 0.1 0.0 - 0.5 K/uL   Basophils Relative 1 %   Basophils Absolute 0.0 0.0 - 0.1 K/uL   Immature Granulocytes 0 %   Abs Immature Granulocytes 0.01 0.00 - 0.07 K/uL    Comment: Performed at [facility], [address] [place], [place] [address]  Comprehensive metabolic panel     Status: Abnormal   Collection Time: 10/03/22  7:07 AM  Result Value Ref Range   Sodium 140 135 - 145 mmol/L    Comment: ELECTROLYTES REPEATED TO VERIFY MJU   Potassium 4.1 3.5 - 5.1 mmol/L   Chloride 112 (H) 98 - 111 mmol/L   CO2  26 22 - 32 mmol/L   Glucose, Bld 86 70 - 99 mg/dL    Comment: Glucose reference range applies only to samples taken after fasting for at least 8 hours.   BUN 20 8 - 23 mg/dL   Creatinine, Ser 0.99 0.61 - 1.24 mg/dL   Calcium 9.2 8.9 - 10.3 mg/dL   Total Protein 6.0 (L) 6.5 - 8.1 g/dL    Albumin 2.4 (L) 3.5 - 5.0 g/dL   AST 42 (H) 15 - 41 U/L   ALT 28 0 - 44 U/L   Alkaline Phosphatase 105 38 - 126 U/L   Total Bilirubin 1.4 (H) 0.3 - 1.2 mg/dL   GFR, Estimated >60 >60 mL/min    Comment: (NOTE) Calculated using the CKD-EPI Creatinine Equation (2021)    Anion gap 2 (L) 5 - 15    Comment: Performed at [facility], [address] [place], [place] [address]  Ammonia     Status: Abnormal   Collection Time: 10/03/22  7:07 AM  Result Value Ref Range   Ammonia 53 (H) 9 - 35 umol/L    Comment: Performed at [facility], [address] [place], [place] [address]    Blood Alcohol level:  Lab Results  Component Value Date   [facility] <10 09/15/2022   ETH <10 65/46/5035    Metabolic Disorder Labs: Lab Results  Component Value Date   HGBA1C 4.8 09/23/2022   MPG 91.06 09/23/2022   MPG 93.93 09/15/2022   No results found for: "PROLACTIN" Lab Results  Component Value Date   CHOL 140 09/23/2022   TRIG 41 09/23/2022   HDL 40 (L) 09/23/2022   CHOLHDL 3.5 09/23/2022   VLDL 8 09/23/2022   LDLCALC 92 09/23/2022   LDLCALC 57 04/29/2022    Physical Findings: AIMS:  , ,  ,  ,    CIWA:    COWS:     Musculoskeletal: Strength & Muscle Tone: within normal limits Gait & Station: normal Patient leans: N/A  Psychiatric Specialty Exam:  Presentation  General Appearance:  Disheveled  Eye Contact: Good  Speech: Clear and Coherent  Speech Volume: Normal  Handedness: Right   Mood and Affect  Mood: Euphoric  Affect: Congruent   Thought Process  Thought Processes: Coherent  Descriptions of Associations:Intact  Orientation:Full (Time, Place and Person)  Thought Content:WDL  History of Schizophrenia/Schizoaffective disorder:No  Duration of Psychotic Symptoms:Less than six months  Hallucinations:No data recorded Ideas of Reference:None  Suicidal Thoughts:No data recorded Homicidal Thoughts:No data recorded  Sensorium   Memory: Immediate Good; Recent Good  Judgment: Fair  Insight: Poor   Executive Functions  Concentration: Good  Attention Span: Good  Recall: Good  Fund of Knowledge: Poor  Language: Fair   Psychomotor Activity  Psychomotor Activity:No data recorded  Assets  Assets: Financial Resources/Insurance; Resilience   Sleep  Sleep:No data recorded   Physical Exam: Physical Exam Vitals and nursing note reviewed.  Constitutional:      Appearance: Normal appearance. He is normal weight.  Neurological:     General: No focal deficit present.     Mental Status: He is alert and oriented to person, place, and time.  Psychiatric:        Attention and Perception: Attention and perception normal.        Mood and Affect: Mood and affect normal.        Speech: Speech normal.        Behavior: Behavior normal. Behavior is cooperative.  Thought Content: Thought content normal.        Cognition and Memory: Cognition and memory normal.        Judgment: Judgment normal.    Review of Systems  Constitutional: Negative.   HENT: Negative.    Eyes: Negative.   Respiratory: Negative.    Cardiovascular: Negative.   Gastrointestinal: Negative.   Genitourinary: Negative.   Musculoskeletal: Negative.   Skin: Negative.   Neurological: Negative.   Endo/Heme/Allergies: Negative.   Psychiatric/Behavioral: Negative.     Blood pressure (!) 156/63, pulse (!) 56, temperature 98.6 F (37 C), temperature source Oral, resp. rate 16, height '5\' 6"'$  (1.676 m), weight 111 kg, SpO2 100 %. Body mass index is 39.5 kg/m.   Treatment Plan Summary: Daily contact with patient to assess and evaluate symptoms and progress in treatment, Medication management, and Plan recheck labs tomorrow morning.  Parks Ranger, DO 10/03/2022, 9:43 AM

## 2022-10-03 NOTE — Progress Notes (Signed)
This is a no charge note  I was asked by Dr. Louis Meckel for recommendations regarding elevated ammonia level. Pt has hx of hepatitis C, found to have elevated ammonia level 53.  Per Dr. Louis Meckel, patient does not have confusion.  No suicidal or homicidal ideations.  I recommended to treat the patient with lactulose, repeat ammonia level in the morning.  If ammonia level is normal in morning, patient can be discharged and follow-up with PCP closely.   Ivor Costa, MD  [facility]   If 7PM-7AM, please contact night-coverage [email or web address] 10/03/2022, 7:24 PM

## 2022-10-03 NOTE — Group Note (Signed)
LCSW Group Therapy Note  Group Date: 10/03/2022 Start Time: 1305 End Time: 1315   Type of Therapy and Topic:  Group Therapy - Healthy vs Unhealthy Coping Skills  Participation Level:  Did Not Attend   Description of Group The focus of this group was to determine what unhealthy coping techniques typically are used by group members and what healthy coping techniques would be helpful in coping with various problems. Patients were guided in becoming aware of the differences between healthy and unhealthy coping techniques. Patients were asked to identify 2-3 healthy coping skills they would like to learn to use more effectively.  Therapeutic Goals Patients learned that coping is what human beings do all day long to deal with various situations in their lives Patients defined and discussed healthy vs unhealthy coping techniques Patients identified their preferred coping techniques and identified whether these were healthy or unhealthy Patients determined 2-3 healthy coping skills they would like to become more familiar with and use more often. Patients provided support and ideas to each other   Summary of Patient Progress:   X  Therapeutic Modalities Cognitive Behavioral Therapy Motivational Interviewing  Abernathy A [place], [place] 10/03/2022  1:19 PM

## 2022-10-03 NOTE — Plan of Care (Signed)
Pt endorses anxiety however denies depression at this time. Pt denies SI/HI/AVH or pain at this time. Pt is calm and cooperative. Pt is medication compliant. Pt provided with support and encouragement. Pt monitored q15 minutes for safety per unit policy. Plan of care ongoing.   Pt interacts minimally with staff and mostly self isolates too room.   Problem: Education: Goal: Knowledge of General Education information will improve Description: Including pain rating scale, medication(s)/side effects and non-pharmacologic comfort measures Outcome: Progressing   Problem: Activity: Goal: Risk for activity intolerance will decrease Outcome: Progressing

## 2022-10-04 LAB — CBC WITH DIFFERENTIAL/PLATELET
Abs Immature Granulocytes: 0.01 10*3/uL (ref 0.00–0.07)
Basophils Absolute: 0 10*3/uL (ref 0.0–0.1)
Basophils Relative: 1 %
Eosinophils Absolute: 0.1 10*3/uL (ref 0.0–0.5)
Eosinophils Relative: 6 %
HCT: 30.6 % — ABNORMAL LOW (ref 39.0–52.0)
Hemoglobin: 10.2 g/dL — ABNORMAL LOW (ref 13.0–17.0)
Immature Granulocytes: 0 %
Lymphocytes Relative: 37 %
Lymphs Abs: 0.8 10*3/uL (ref 0.7–4.0)
MCH: 31.4 pg (ref 26.0–34.0)
MCHC: 33.3 g/dL (ref 30.0–36.0)
MCV: 94.2 fL (ref 80.0–100.0)
Monocytes Absolute: 0.4 10*3/uL (ref 0.1–1.0)
Monocytes Relative: 18 %
Neutro Abs: 0.9 10*3/uL — ABNORMAL LOW (ref 1.7–7.7)
Neutrophils Relative %: 38 %
Platelets: 76 10*3/uL — ABNORMAL LOW (ref 150–400)
RBC: 3.25 MIL/uL — ABNORMAL LOW (ref 4.22–5.81)
RDW: 17.5 % — ABNORMAL HIGH (ref 11.5–15.5)
WBC: 2.3 10*3/uL — ABNORMAL LOW (ref 4.0–10.5)
nRBC: 0 % (ref 0.0–0.2)

## 2022-10-04 LAB — COMPREHENSIVE METABOLIC PANEL
ALT: 29 U/L (ref 0–44)
AST: 43 U/L — ABNORMAL HIGH (ref 15–41)
Albumin: 2.1 g/dL — ABNORMAL LOW (ref 3.5–5.0)
Alkaline Phosphatase: 103 U/L (ref 38–126)
Anion gap: 3 — ABNORMAL LOW (ref 5–15)
BUN: 19 mg/dL (ref 8–23)
CO2: 24 mmol/L (ref 22–32)
Calcium: 9 mg/dL (ref 8.9–10.3)
Chloride: 111 mmol/L (ref 98–111)
Creatinine, Ser: 0.91 mg/dL (ref 0.61–1.24)
GFR, Estimated: >60 mL/min (ref >60)
Glucose, Bld: 77 mg/dL (ref 70–99)
Potassium: 3.7 mmol/L (ref 3.5–5.1)
Sodium: 138 mmol/L (ref 135–145)
Total Bilirubin: 1.1 mg/dL (ref 0.3–1.2)
Total Protein: 5.5 g/dL — ABNORMAL LOW (ref 6.5–8.1)

## 2022-10-04 LAB — AMMONIA: Ammonia: 45 umol/L — ABNORMAL HIGH (ref 9–35)

## 2022-10-04 MED ORDER — QUETIAPINE FUMARATE 50 MG PO TABS: 50.0000 mg | ORAL_TABLET | Freq: Every day | ORAL | 5 refills | Status: DC

## 2022-10-04 MED ORDER — LOSARTAN POTASSIUM 50 MG PO TABS: 50.0000 mg | ORAL_TABLET | Freq: Every day | ORAL | 5 refills | Status: DC

## 2022-10-04 MED ORDER — ASPIRIN 81 MG PO TBEC: 81.0000 mg | DELAYED_RELEASE_TABLET | Freq: Every day | ORAL | 5 refills | Status: DC

## 2022-10-04 MED ORDER — CITALOPRAM HYDROBROMIDE 20 MG PO TABS: 20.0000 mg | ORAL_TABLET | Freq: Every day | ORAL | 5 refills | Status: DC

## 2022-10-04 MED ORDER — NITROGLYCERIN 0.4 MG SL SUBL: 0.4000 mg | SUBLINGUAL_TABLET | SUBLINGUAL | 5 refills | Status: DC | PRN

## 2022-10-04 MED ORDER — ATORVASTATIN CALCIUM 40 MG PO TABS: 40.0000 mg | ORAL_TABLET | Freq: Every day | ORAL | 5 refills | Status: DC

## 2022-10-04 MED ORDER — LACTULOSE 10 GM/15ML PO SOLN: 30.0000 g | Freq: Four times a day (QID) | ORAL | 5 refills | Status: DC

## 2022-10-04 MED ORDER — DOXEPIN HCL 50 MG PO CAPS: 50.0000 mg | ORAL_CAPSULE | Freq: Every day | ORAL | 5 refills | Status: DC

## 2022-10-04 MED ORDER — HYDROXYZINE HCL 10 MG PO TABS: 10.0000 mg | ORAL_TABLET | Freq: Three times a day (TID) | ORAL | 5 refills | Status: DC | PRN

## 2022-10-04 MED ORDER — PANTOPRAZOLE SODIUM 20 MG PO TBEC: 20.0000 mg | DELAYED_RELEASE_TABLET | Freq: Every day | ORAL | 5 refills | Status: DC

## 2022-10-04 MED ORDER — ALBUTEROL SULFATE HFA 108 (90 BASE) MCG/ACT IN AERS: 2.0000 | INHALATION_SPRAY | Freq: Four times a day (QID) | RESPIRATORY_TRACT | 5 refills | Status: DC | PRN

## 2022-10-04 NOTE — BHH Suicide Risk Assessment (Signed)
[facility] Discharge Suicide Risk Assessment   Principal Problem: MDD (major depressive disorder) Discharge Diagnoses: Principal Problem:   MDD (major depressive disorder)   Total Time spent with patient: 1 hour  Musculoskeletal: Strength & Muscle Tone: within normal limits Gait & Station: normal Patient leans: N/A  Psychiatric Specialty Exam  Presentation  General Appearance:  Disheveled  Eye Contact: Good  Speech: Clear and Coherent  Speech Volume: Normal  Handedness: Right   Mood and Affect  Mood: Euphoric  Duration of Depression Symptoms: Greater than two weeks  Affect: Congruent   Thought Process  Thought Processes: Coherent  Descriptions of Associations:Intact  Orientation:Full (Time, Place and Person)  Thought Content:WDL  History of Schizophrenia/Schizoaffective disorder:No  Duration of Psychotic Symptoms:Less than six months  Hallucinations:No data recorded Ideas of Reference:None  Suicidal Thoughts:No data recorded Homicidal Thoughts:No data recorded  Sensorium  Memory: Immediate Good; Recent Good  Judgment: Fair  Insight: Poor   Executive Functions  Concentration: Good  Attention Span: Good  Recall: Good  Fund of Knowledge: Poor  Language: Fair   Psychomotor Activity  Psychomotor Activity:No data recorded  Assets  Assets: Financial Resources/Insurance; Resilience   Sleep  Sleep:No data recorded  Physical Exam: Physical Exam ROS Blood pressure (!) 169/73, pulse (!) 52, temperature 98.6 F (37 C), temperature source Oral, resp. rate 18, height '5\' 6"'$  (1.676 m), weight 111 kg, SpO2 100 %. Body mass index is 39.5 kg/m.  Mental Status Per Nursing Assessment::   On Admission:  NA  Demographic Factors:  Male, Age 72 or older, Caucasian, and Unemployed  Loss Factors: Decline in physical health  Historical Factors: NA  Risk Reduction Factors:   Sense of responsibility to family, Positive social  support, and Positive coping skills or problem solving skills  Continued Clinical Symptoms:  Depression:   Anhedonia  Cognitive Features That Contribute To Risk:  None    Suicide Risk:  Minimal: No identifiable suicidal ideation.  Patients presenting with no risk factors but with morbid ruminations; may be classified as minimal risk based on the severity of the depressive symptoms   Follow-up [place], [facility] Follow up on 10/10/2022.   Why: You have a follow up appointment with primary care provider, Dr. Elsworth Soho on Wednesday February 14th at 1pm. Thanks! Please contact their office if you need to reschedule. Contact information: [place] [address] [phone]         [facility] Follow up on 10/10/2022.   Why: You have an appointment with a behavioral health counselor for follow up on Wesnesday February 14th at 1:20pm. Thanks! Please contact their office if you need to reschedule. Contact information: [place] [address] [phone]                 Plan Of Care/Follow-up recommendations:  As above  Parks Ranger, DO 10/04/2022, 10:35 AM

## 2022-10-04 NOTE — Plan of Care (Signed)
  Problem: Education: Goal: Knowledge of General Education information will improve Description: Including pain rating scale, medication(s)/side effects and non-pharmacologic comfort measures 10/04/2022 1245 by Nolon Bussing, RN Outcome: Adequate for Discharge 10/04/2022 1038 by Nolon Bussing, RN Outcome: Progressing   Problem: Health Behavior/Discharge Planning: Goal: Ability to manage health-related needs will improve 10/04/2022 1245 by Nolon Bussing, RN Outcome: Adequate for Discharge 10/04/2022 1038 by Nolon Bussing, RN Outcome: Progressing   Problem: Clinical Measurements: Goal: Ability to maintain clinical measurements within normal limits will improve 10/04/2022 1245 by Nolon Bussing, RN Outcome: Adequate for Discharge 10/04/2022 1038 by Nolon Bussing, RN Outcome: Progressing Goal: Will remain free from infection 10/04/2022 1245 by Nolon Bussing, RN Outcome: Adequate for Discharge 10/04/2022 1038 by Nolon Bussing, RN Outcome: Progressing Goal: Diagnostic test results will improve 10/04/2022 1245 by Nolon Bussing, RN Outcome: Adequate for Discharge 10/04/2022 1038 by Nolon Bussing, RN Outcome: Progressing Goal: Respiratory complications will improve 10/04/2022 1245 by Nolon Bussing, RN Outcome: Adequate for Discharge 10/04/2022 1038 by Nolon Bussing, RN Outcome: Progressing Goal: Cardiovascular complication will be avoided 10/04/2022 1245 by Nolon Bussing, RN Outcome: Adequate for Discharge 10/04/2022 1038 by Nolon Bussing, RN Outcome: Progressing   Problem: Activity: Goal: Risk for activity intolerance will decrease 10/04/2022 1245 by Nolon Bussing, RN Outcome: Adequate for Discharge 10/04/2022 1038 by Nolon Bussing, RN Outcome: Progressing   Problem: Nutrition: Goal: Adequate nutrition will be maintained 10/04/2022 1245 by Nolon Bussing, RN Outcome: Adequate for Discharge 10/04/2022 1038 by Nolon Bussing, RN Outcome: Progressing   Problem: Nutrition: Goal: Risk of aspiration will decrease 10/04/2022 1245 by Nolon Bussing, RN Outcome: Adequate for Discharge 10/04/2022 1038 by Nolon Bussing, RN Outcome: Progressing Goal: Dietary intake will improve 10/04/2022 1245 by Nolon Bussing, RN Outcome: Adequate for Discharge 10/04/2022 1038 by Nolon Bussing, RN Outcome: Progressing   Problem: Self-Care: Goal: Ability to participate in self-care as condition permits will improve 10/04/2022 1245 by Nolon Bussing, RN Outcome: Adequate for Discharge 10/04/2022 1038 by Nolon Bussing, RN Outcome: Progressing Goal: Verbalization of feelings and concerns over difficulty with self-care will improve 10/04/2022 1245 by Nolon Bussing, RN Outcome: Adequate for Discharge 10/04/2022 1038 by Nolon Bussing, RN Outcome: Progressing Goal: Ability to communicate needs accurately will improve 10/04/2022 1245 by Nolon Bussing, RN Outcome: Adequate for Discharge 10/04/2022 1038 by Nolon Bussing, RN Outcome: Progressing

## 2022-10-04 NOTE — Discharge Summary (Signed)
Physician Discharge Summary Note  Patient:  [name] is an 72 y.o., male MRN:  [id number] DOB:  Jan 18, 1951 Patient phone:  [phone] (home)  Patient address:   [address] [place] [address],  Total Time spent with patient: 1 hour  Date of Admission:  09/24/2022 Date of Discharge: 10/04/2022  Reason for Admission:   [name] is a 72 year old white male who was voluntarily admitted to inpatient psychiatry for suicidal ideation.  He is homeless and lives in a tent in [place].  He tells me that he has PTSD from his brief training in the [employer] when he was 72 years old.  Apparently, someone was killed in front of him.  He is currently on disability.  He does have a Psychiatrist who lives in [place] but is also homeless.  He last had housing in 2018 but was evicted after the landlord was a fraud.  He sees Dr. Posey Pronto at the [facility] in [place].  He has a history of blindness, deaf, CHF, hypertension, liver and kidney disease, and type 2 diabetes.  He endorses anhedonia, difficulty sleeping, depressed mood, anxiety and hallucinations when he has flashbacks.  He was last psychiatrically hospitalized in the 1990s and does not have any past suicide attempts.  He currently does not have a psychiatrist.  He was seen in the emergency room recently for chest pain and a URI and given a prescription for Tessalon.  More recently seen by psychiatric nurse practitioner because he stated that he was going to cut his wrists.  He informs me that he does not do well on tricyclic antidepressants.     2 days ago the nurses found patient to be obtunded and unresponsive even though his vital signs were stable.  Internal medicine hospitalist transfer him to their floor for what sounded to be encephalopathy or stroke.  Neurology was consulted. Etiology is not clear.  Potential differential diagnoses include stroke and hepatic encephalopathy.  CT head negative.  CT angiogram of head and neck  negative for LVO.  Consulted Dr. Leonel Ramsay of neurology.  It was found that he has chronic liver disease and his ammonia level was high and he was treated with lactulose with a return to baseline and transfer back to psychiatry.  Principal Problem: MDD (major depressive disorder), recurrent severe, without psychosis ([place]) Discharge Diagnoses: Principal Problem:   MDD (major depressive disorder), recurrent severe, without psychosis ([place])   Past Psychiatric History: Depression  Past Medical History:  Past Medical History:  Diagnosis Date   Asthma    CHF (congestive heart failure) ([place])    COPD (chronic obstructive pulmonary disease) ([place])    COVID-19 03/2020   diagnosed in August 2021   Diabetes mellitus without complication ([place])    Homelessness    Hypertension    Infestation by bed bug    Migraine    Obesity    Sleep apnea     Past Surgical History:  Procedure Laterality Date   CARDIAC CATHETERIZATION     CHOLECYSTECTOMY     EYE SURGERY     INNER EAR SURGERY     NOSE SURGERY     Family History:  Family History  Problem Relation Age of Onset   Hypertension Mother    Heart disease Mother    Heart failure Maternal Grandmother    Family Psychiatric  History: Daughter with mental illness Social History:  Social History   Substance and Sexual Activity  Alcohol Use Never  Social History   Substance and Sexual Activity  Drug Use Never    Social History   Socioeconomic History   Marital status: Single    Spouse name: Not on file   Number of children: 2   Years of education: 12   Highest education level: 12th grade  Occupational History   Occupation: retired  Tobacco Use   Smoking status: Former   Smokeless tobacco: Never  Scientific laboratory technician Use: Not on file  Substance and Sexual Activity   Alcohol use: Never   Drug use: Never   Sexual activity: Never    Birth control/protection: None  Other Topics Concern   Not on file  Social History Narrative    Not on file   Social Determinants of Health   Financial Resource Strain: High Risk (04/27/2022)   Overall Financial Resource Strain (CARDIA)    Difficulty of Paying Living Expenses: Very hard  Food Insecurity: No Food Insecurity (09/24/2022)   Hunger Vital Sign    Worried About Running Out of Food in the Last Year: Never true    Ran Out of Food in the Last Year: Never true  Transportation Needs: No Transportation Needs (09/24/2022)   PRAPARE - Hydrologist (Medical): No    Lack of Transportation (Non-Medical): No  Physical Activity: Sufficiently Active (04/16/2018)   Exercise Vital Sign    Days of Exercise per Week: 7 days    Minutes of Exercise per Session: 30 min  Stress: Stress Concern Present (04/16/2018)   [place]    Feeling of Stress : Very much  Social Connections: Moderately Isolated (04/30/2018)   Social Connection and Isolation Panel [NHANES]    Frequency of Communication with Friends and Family: Twice a week    Frequency of Social Gatherings with Friends and Family: Never    Attends Religious Services: More than 4 times per year    Active Member of [employer] or Organizations: No    Attends Archivist Meetings: Never    Marital Status: Widowed    Hospital Course:  [name] is a 72 y/o white male who presented to the ER on a voluntary admission to geriatric psychiatry with depression and multiple medical problems. He is homeless due to being on the sex offenders list but does receive disability. He was started on Celexa and Doxepin, a long with Seroquel at night. His home medications were restarted. His mood improved. During his stay he was transferred to Medicine after developing metabolic encephalopathy due to a history of Hepatitis C. He was started on lactulose and his cognition improved and was transferred back to Psychiatry. He developed encephalopathy again but improved with  an increase in his lactulose. Over-all, his mood, affect, and cognition improved. It was felt that he maximized hospitalization and discharged to the care of his daughter and her boyfriend. On the day of discharge, he denied suicidal ideation, homicidal ideation, auditory or visual hallucinations. His judgement and insight were good.  Physical Findings: AIMS:  , ,  ,  ,    CIWA:    COWS:     Musculoskeletal: Strength & Muscle Tone: within normal limits Gait & Station: normal Patient leans: N/A   Psychiatric Specialty Exam:  Presentation  General Appearance:  Disheveled  Eye Contact: Good  Speech: Clear and Coherent  Speech Volume: Normal  Handedness: Right   Mood and Affect  Mood: Euphoric  Affect: Congruent   Thought  Process  Thought Processes: Coherent  Descriptions of Associations:Intact  Orientation:Full (Time, Place and Person)  Thought Content:WDL  History of Schizophrenia/Schizoaffective disorder:No  Duration of Psychotic Symptoms:Less than six months  Hallucinations:No data recorded Ideas of Reference:None  Suicidal Thoughts:No data recorded Homicidal Thoughts:No data recorded  Sensorium  Memory: Immediate Good; Recent Good  Judgment: Fair  Insight: Poor   Executive Functions  Concentration: Good  Attention Span: Good  Recall: Good  Fund of Knowledge: Poor  Language: Fair   Psychomotor Activity  Psychomotor Activity:No data recorded  Assets  Assets: Financial Resources/Insurance; Resilience   Sleep  Sleep:No data recorded   Physical Exam: Physical Exam Vitals and nursing note reviewed.  Constitutional:      Appearance: Normal appearance. He is normal weight.  Neurological:     General: No focal deficit present.     Mental Status: He is alert and oriented to person, place, and time.  Psychiatric:        Attention and Perception: Attention and perception normal.        Mood and Affect: Mood and affect  normal.        Speech: Speech normal.        Behavior: Behavior normal. Behavior is cooperative.        Thought Content: Thought content normal.        Cognition and Memory: Cognition and memory normal.        Judgment: Judgment normal.    Review of Systems  Constitutional: Negative.   HENT: Negative.    Eyes: Negative.   Respiratory: Negative.    Cardiovascular: Negative.   Gastrointestinal: Negative.   Genitourinary: Negative.   Musculoskeletal: Negative.   Skin: Negative.   Neurological: Negative.   Endo/Heme/Allergies: Negative.   Psychiatric/Behavioral: Negative.     Blood pressure (!) 169/73, pulse (!) 52, temperature 98.6 F (37 C), temperature source Oral, resp. rate 18, height '5\' 6"'$  (1.676 m), weight 111 kg, SpO2 100 %. Body mass index is 39.5 kg/m.   Social History   Tobacco Use  Smoking Status Former  Smokeless Tobacco Never   Tobacco Cessation:  N/A, patient does not currently use tobacco products   Blood Alcohol level:  Lab Results  Component Value Date   ETH <10 09/15/2022   ETH <10 93/71/6967    Metabolic Disorder Labs:  Lab Results  Component Value Date   HGBA1C 4.8 09/23/2022   MPG 91.06 09/23/2022   MPG 93.93 09/15/2022   No results found for: "PROLACTIN" Lab Results  Component Value Date   CHOL 140 09/23/2022   TRIG 41 09/23/2022   HDL 40 (L) 09/23/2022   CHOLHDL 3.5 09/23/2022   VLDL 8 09/23/2022   LDLCALC 92 09/23/2022   LDLCALC 57 04/29/2022    See Psychiatric Specialty Exam and Suicide Risk Assessment completed by Attending Physician prior to discharge.  Discharge destination:  Home  Is patient on multiple antipsychotic therapies at discharge:  No   Has Patient had three or more failed trials of antipsychotic monotherapy by history:  No  Recommended Plan for Multiple Antipsychotic Therapies: NA   Allergies as of 10/04/2022       Reactions   Bee Venom Anaphylaxis   Codeine Itching   Only itching. Recently allergy tested  at [facility]   Novocain [procaine]    Sulfa Antibiotics Other (See Comments)        Medication List     STOP taking these medications    furosemide 20 MG tablet Commonly known as:  LASIX   lansoprazole 30 MG capsule Commonly known as: PREVACID Replaced by: pantoprazole 20 MG tablet   lisinopril 40 MG tablet Commonly known as: ZESTRIL       TAKE these medications      Indication  albuterol 108 (90 Base) MCG/ACT inhaler Commonly known as: VENTOLIN HFA Inhale 2 puffs into the lungs every 6 (six) hours as needed for wheezing or shortness of breath. What changed: Another medication with the same name was removed. Continue taking this medication, and follow the directions you see here.  Indication: Chronic Obstructive Lung Disease   ascorbic acid 500 MG tablet Commonly known as: VITAMIN C Take 1 tablet (500 mg total) by mouth daily.    aspirin EC 81 MG tablet Take 1 tablet (81 mg total) by mouth daily. Swallow whole.  Indication: Disease involving Lipid Deposits in the Arteries   atorvastatin 40 MG tablet Commonly known as: LIPITOR Take 1 tablet (40 mg total) by mouth daily.  Indication: High Amount of Fats in the Blood   citalopram 20 MG tablet Commonly known as: CELEXA Take 1 tablet (20 mg total) by mouth daily.  Indication: Major Depressive Disorder   doxepin 50 MG capsule Commonly known as: SINEQUAN Take 1 capsule (50 mg total) by mouth at bedtime.  Indication: Depression   glucosamine-chondroitin 500-400 MG tablet Take 1 tablet by mouth 3 (three) times daily.    hydrOXYzine 10 MG tablet Commonly known as: ATARAX Take 1 tablet (10 mg total) by mouth 3 (three) times daily as needed for anxiety. What changed: reasons to take this  Indication: Feeling Anxious   Jardiance 10 MG Tabs tablet Generic drug: empagliflozin Take 1 tablet (10 mg total) by mouth daily.    lactulose 10 GM/15ML solution Commonly known as: CHRONULAC Take 45 mLs (30 g total) by mouth  QID. What changed: when to take this  Indication: Impaired Brain Function due to Liver Disease   losartan 50 MG tablet Commonly known as: COZAAR Take 1 tablet (50 mg total) by mouth daily after breakfast. Start taking on: October 05, 2022  Indication: High Blood Pressure Disorder   metFORMIN 500 MG 24 hr tablet Commonly known as: GLUCOPHAGE-XR Take 1 tablet (500 mg total) by mouth daily.    multivitamin with minerals Tabs tablet Take 1 tablet by mouth daily.    nitroGLYCERIN 0.4 MG SL tablet Commonly known as: NITROSTAT Place 1 tablet (0.4 mg total) under the tongue every 5 (five) minutes x 3 doses as needed for chest pain.  Indication: Acute Angina Pectoris   pantoprazole 20 MG tablet Commonly known as: PROTONIX Take 1 tablet (20 mg total) by mouth daily. Replaces: lansoprazole 30 MG capsule  Indication: Gastroesophageal Reflux Disease   QUEtiapine 50 MG tablet Commonly known as: SEROQUEL Take 1 tablet (50 mg total) by mouth at bedtime.  Indication: Major Depressive Disorder        Follow-up [place], [facility] Follow up on 10/10/2022.   Why: You have a follow up appointment with primary care provider, Dr. Elsworth Soho on Wednesday February 14th at 1pm. Thanks! Please contact their office if you need to reschedule. Contact information: [place] [address] [phone]         Center, [facility] Follow up on 10/10/2022.   Why: You have an appointment with a behavioral health counselor for follow up on Wesnesday February 14th at 1:20pm. Thanks! Please contact their office if you need to reschedule. Contact information:  [place] [address] [phone]                 Follow-up recommendations:  [facility]    Signed: Parks Ranger, DO 10/04/2022, 11:46 AM

## 2022-10-04 NOTE — Care Management Important Message (Signed)
Important Message  Patient Details  Name: [name] MRN: [id number] Date of Birth: 1951-07-20   Medicare Important Message Given:  Yes     Ferry Mat[place] A [place], [place] 10/04/2022, 11:38 AM

## 2022-10-04 NOTE — Progress Notes (Signed)
Patient is alert and oriented times 4. Mood and affect sad/sullen. Patient denies pain. He denies SI, HI, and AVH. Also denies feelings of anxiety and depression at this time. States he slept good last night. Morning meds given whole by mouth W/O difficulty. Ate breakfast in day room- appetite good. Patient remains on unit with Q15 minute checks in place.

## 2022-10-04 NOTE — Progress Notes (Signed)
   10/04/22 0757  Psych Admission Type (Psych Patients Only)  Admission Status Voluntary  Psychosocial Assessment  Patient Complaints Anxiety  Eye Contact Fair  Facial Expression Flat  Affect Appropriate to circumstance  Speech Logical/coherent  Interaction Assertive  Motor Activity Slow  Appearance/Hygiene Unremarkable  Behavior Characteristics Cooperative  Mood Pleasant  Thought Process  Coherency WDL  Content WDL  Delusions None reported or observed  Perception WDL  Hallucination None reported or observed  Judgment WDL  Confusion WDL  Danger to Others  Danger to Others None reported or observed

## 2022-10-04 NOTE — Progress Notes (Signed)
  [facility] Adult Case Management Discharge Plan :  Will you be returning to the same living situation after discharge:  No. At discharge, do you have transportation home?: Yes,  pt will be taking public transportation Do you have the ability to pay for your medications: Yes,  pt has [facility] Medicare  Release of information consent forms completed and in the chart;  Patient's signature needed at discharge.  Patient to Follow up at:  Follow-up [place], [facility] Follow up on 10/10/2022.   Why: You have Drew follow up appointment with primary care provider, Dr. Elsworth Soho on Wednesday February 14th at 1pm. Thanks! Please contact their office if you need to reschedule. Contact information: [place] [address] [phone]         Center, [facility] Follow up on 10/10/2022.   Why: You have an appointment with Drew behavioral health counselor for follow up on Wesnesday February 14th at 1:20pm. Thanks! Please contact their office if you need to reschedule. Contact information: [place] [address] [phone]                 Next level of care provider has access to [place] and Suicide Prevention discussed: Yes,  SPE completed with pt's daughter, [name]     Has patient been referred to the Quitline?: Patient refused referral  Patient has been referred for addiction treatment: N/Drew  [name] [name], [place] 10/04/2022, 11:39 AM

## 2022-10-04 NOTE — Progress Notes (Signed)
Patient alert and oriented x 4.  Affect is pleasant and calm. Denies anxiety, SI/HI or AVH.  Patient states they will try to keep themselves safe when they return home.  Reviewed discharge instructions with patient including follow up appointment with provider, medication and prescriptions.  Questions answered and understanding verbalized.  Discharge packet given.  All belongings returned to patient after verification completed by staff.    Patient escorted by staff off unit at this time stable without complaint. 

## 2022-10-17 ENCOUNTER — Other Ambulatory Visit: Payer: Self-pay

## 2022-10-17 ENCOUNTER — Encounter: Payer: Self-pay | Admitting: Emergency Medicine

## 2022-10-17 ENCOUNTER — Emergency Department: Payer: 59

## 2022-10-17 ENCOUNTER — Emergency Department
Admission: EM | Admit: 2022-10-17 | Discharge: 2022-10-17 | Disposition: A | Discharge status: Home / Self Care | Payer: 59 | Attending: Emergency Medicine | Admitting: Emergency Medicine

## 2022-10-17 DIAGNOSIS — R0789 Other chest pain: Secondary | ICD-10-CM | POA: Diagnosis present

## 2022-10-17 DIAGNOSIS — Y92480 Sidewalk as the place of occurrence of the external cause: Secondary | ICD-10-CM | POA: Insufficient documentation

## 2022-10-17 DIAGNOSIS — W19XXXA Unspecified fall, initial encounter: Secondary | ICD-10-CM

## 2022-10-17 DIAGNOSIS — S20212A Contusion of left front wall of thorax, initial encounter: Secondary | ICD-10-CM | POA: Insufficient documentation

## 2022-10-17 DIAGNOSIS — W101XXA Fall (on)(from) sidewalk curb, initial encounter: Secondary | ICD-10-CM | POA: Insufficient documentation

## 2022-10-17 MED ORDER — TRAMADOL HCL 50 MG PO TABS
50.0000 mg | ORAL_TABLET | Freq: Once | ORAL | Status: AC
  Administered 2022-10-17: 50 mg via ORAL
  Filled 2022-10-17: qty 1

## 2022-10-17 MED ORDER — LIDOCAINE 5 % EX PTCH
1.0000 | MEDICATED_PATCH | CUTANEOUS | Status: DC
  Administered 2022-10-17: 1 via TRANSDERMAL
  Filled 2022-10-17: qty 1

## 2022-10-17 MED ORDER — GABAPENTIN 300 MG PO CAPS
300.0000 mg | ORAL_CAPSULE | Freq: Once | ORAL | Status: AC
  Administered 2022-10-17: 300 mg via ORAL
  Filled 2022-10-17: qty 1

## 2022-10-17 NOTE — ED Notes (Addendum)
First Nurse Note: Patient to ED via [facility] from the [facility]. Patient fell off curb- denies LOC or hitting head. C/o pain the left ribs.  145/57 56 HR 96% RA 139 cbg

## 2022-10-17 NOTE — Discharge Instructions (Signed)
Please seek medical attention for any high fevers, chest pain, shortness of breath, change in behavior, persistent vomiting, bloody stool or any other new or concerning symptoms.  

## 2022-10-17 NOTE — ED Triage Notes (Signed)
Pt reports he tried to around fast and fell on curb at [employer]. Pt fell on left side and states he heard a crack at his ribs. Also reports feeling weak since Nov 18th.

## 2022-10-17 NOTE — ED Provider Notes (Signed)
[facility] Provider Note    Event Date/Time   First MD Initiated Contact with Patient 10/17/22 1941     (approximate)   History   Fall   HPI  [name] is a 72 y.o. male who presents to the emergency department today because of concerns for left chest wall pain.  Patient states that he tripped and fell onto a curb.  He denies any lightheadedness or syncopal type symptoms prior to the fall.  He is having pain to that left chest wall.  It is worse with deep breaths.  He denies any other injuries.  Denies any recent illness.  States that he hurt his left ribs and had a fracture a few months ago.     Physical Exam   Triage Vital Signs: ED Triage Vitals  Enc Vitals Group     BP 10/17/22 1804 122/62     Pulse Rate 10/17/22 1804 (!) 55     Resp 10/17/22 1804 18     Temp 10/17/22 1804 98.4 F (36.9 C)     Temp Source 10/17/22 1804 Oral     SpO2 10/17/22 1804 95 %     Weight 10/17/22 1806 244 lb 11.4 oz (111 kg)     Height 10/17/22 1806 5' 6"$  (1.676 m)     Head Circumference --      Peak Flow --      Pain Score 10/17/22 1806 10     Pain Loc --      Pain Edu? --      Excl. in [place]? --     Most recent vital signs: Vitals:   10/17/22 1804  BP: 122/62  Pulse: (!) 55  Resp: 18  Temp: 98.4 F (36.9 C)  SpO2: 95%   General: Awake, alert, oriented. CV:  Good peripheral perfusion. Regular rate and rhythm. Resp:  Normal effort. Lungs clear. Abd:  No distention.  Other:  Tender to palpation over the left chest wall. No crepitus.    ED Results / Procedures / Treatments   Labs (all labs ordered are listed, but only abnormal results are displayed) Labs Reviewed - No data to display   EKG  None   RADIOLOGY I independently interpreted and visualized the Left rib series. My interpretation: No fracture. No pneumothorax Radiology interpretation:  IMPRESSION:  Negative.      PROCEDURES:  Critical Care performed:  No  Procedures   MEDICATIONS ORDERED IN ED: Medications - No data to display   IMPRESSION / MDM / [place] / ED COURSE  I reviewed the triage vital signs and the nursing notes.                              Differential diagnosis includes, but is not limited to, rib fracture, PTX, contusion.  Patient's presentation is most consistent with acute presentation with potential threat to life or bodily function.   Patient presented to the emergency department today because of concerns for left chest wall pain after mechanical fall.  On exam patient did have some tenderness to the left chest wall without any crepitus.  X-ray without any concerning signs for pneumothorax or rib fracture.  I discussed this with the patient.  We attempted pain control here in the emergency department.  Patient states that he is very hard to control his pain.  Additionally patient was given and shown how to use incentive spirometer.  Did  discuss concern for possible pneumonia anytime there is significant chest wall trauma.  Patient denied any other injuries.   FINAL CLINICAL IMPRESSION(S) / ED DIAGNOSES   Final diagnoses:  Fall, initial encounter  Contusion of rib on left side, initial encounter    Note:  This document was prepared using Dragon voice recognition software and may include unintentional dictation errors.    Nance Pear, MD 10/17/22 [phone]

## 2022-10-21 ENCOUNTER — Other Ambulatory Visit: Payer: Self-pay

## 2022-10-21 ENCOUNTER — Encounter: Payer: Self-pay | Admitting: Emergency Medicine

## 2022-10-21 ENCOUNTER — Emergency Department
Admission: EM | Admit: 2022-10-21 | Discharge: 2022-10-21 | Disposition: A | Discharge status: Home / Self Care | Payer: 59 | Attending: Emergency Medicine | Admitting: Emergency Medicine

## 2022-10-21 DIAGNOSIS — I509 Heart failure, unspecified: Secondary | ICD-10-CM | POA: Insufficient documentation

## 2022-10-21 DIAGNOSIS — J449 Chronic obstructive pulmonary disease, unspecified: Secondary | ICD-10-CM | POA: Diagnosis not present

## 2022-10-21 DIAGNOSIS — E119 Type 2 diabetes mellitus without complications: Secondary | ICD-10-CM | POA: Insufficient documentation

## 2022-10-21 DIAGNOSIS — R519 Headache, unspecified: Secondary | ICD-10-CM | POA: Diagnosis present

## 2022-10-21 DIAGNOSIS — G43909 Migraine, unspecified, not intractable, without status migrainosus: Secondary | ICD-10-CM | POA: Diagnosis not present

## 2022-10-21 MED ORDER — METOCLOPRAMIDE HCL 10 MG PO TABS
10.0000 mg | ORAL_TABLET | Freq: Once | ORAL | Status: AC
  Administered 2022-10-21: 10 mg via ORAL
  Filled 2022-10-21: qty 1

## 2022-10-21 MED ORDER — KETOROLAC TROMETHAMINE 60 MG/2ML IM SOLN
60.0000 mg | Freq: Once | INTRAMUSCULAR | Status: AC
  Administered 2022-10-21: 60 mg via INTRAMUSCULAR
  Filled 2022-10-21: qty 2

## 2022-10-21 MED ORDER — DIPHENHYDRAMINE HCL 25 MG PO CAPS
50.0000 mg | ORAL_CAPSULE | Freq: Once | ORAL | Status: AC
  Administered 2022-10-21: 50 mg via ORAL
  Filled 2022-10-21: qty 2

## 2022-10-21 NOTE — ED Notes (Signed)
Pt taken to bathroom shortly to being placed in room 53. Pt positioned in bed comfortably and denied needing anytime else at this time. Pt made aware to press call bell in case he needed to get up and use the bathroom again.

## 2022-10-21 NOTE — ED Provider Notes (Signed)
   [facility] Provider Note    Event Date/Time   First MD Initiated Contact with Patient 10/21/22 2010     (approximate)  History   Chief Complaint: Headache  HPI  [name] is a 72 y.o. male with a past medical history of CHF, COPD, diabetes, presents to the emergency department for headache.  According to the patient approximate 3 hours ago he developed a migraine headache.  Patient states that history of migraine headaches.  States he normally requires medications when he comes to the emergency department for his headache.  Denies any known fever denies any nausea or vomiting.  Physical Exam   Triage Vital Signs: ED Triage Vitals  Enc Vitals Group     BP 10/21/22 1833 (!) 119/54     Pulse Rate 10/21/22 1833 64     Resp 10/21/22 1833 16     Temp 10/21/22 1833 98.9 F (37.2 C)     Temp Source 10/21/22 1833 Oral     SpO2 10/21/22 1833 100 %     Weight --      Height --      Head Circumference --      Peak Flow --      Pain Score 10/21/22 1828 8     Pain Loc --      Pain Edu? --      Excl. in [place]? --     Most recent vital signs: Vitals:   10/21/22 1833  BP: (!) 119/54  Pulse: 64  Resp: 16  Temp: 98.9 F (37.2 C)  SpO2: 100%    General: Awake, no distress.  CV:  Good peripheral perfusion.  Regular rate and rhythm  Resp:  Normal effort.  Equal breath sounds bilaterally.  Abd:  No distention.  Soft, nontender.  No rebound or guarding.  ED Results / Procedures / Treatments   MEDICATIONS ORDERED IN ED: Medications  ketorolac (TORADOL) injection 60 mg (has no administration in time range)  metoCLOPramide (REGLAN) tablet 10 mg (has no administration in time range)  diphenhydrAMINE (BENADRYL) capsule 50 mg (has no administration in time range)     IMPRESSION / MDM / ASSESSMENT AND PLAN / ED COURSE  I reviewed the triage vital signs and the nursing notes.  Patient's presentation is most consistent with acute presentation with  potential threat to life or bodily function.  Patient with a past medical history of migraine headaches presents to the emergency department for 3 hours of a headache which he states feels consistent with his migraines.  Currently patient is lying in bed, no distress, watching TV.  Patient states moderate headache.  Denies any weakness or numbness no fever.  Will treat with Toradol oral Reglan and Benadryl and reassess.  Patient agreeable to plan.  Patient is feeling much better after medications states he is ready to go.  Will discharge shortly.  FINAL CLINICAL IMPRESSION(S) / ED DIAGNOSES   Migraine headache  Note:  This document was prepared using Dragon voice recognition software and may include unintentional dictation errors.   Harvest Dark, MD 10/21/22 2205

## 2022-10-21 NOTE — ED Notes (Addendum)
Pt states "[name] and [name] have the same number but both should be called" in regards to this pts care. Pt states that either one would possibly answer the phone, but wanted someone to make a note in his chart that added [name] as a contact.  Mobile Number: [phone]

## 2022-10-21 NOTE — ED Notes (Signed)
Pt soiled his bed. Pt taken to the bathroom by wheelchair to be cleaned up. Pt insisted on keeping the same pants on despite them being visibly soiled. Tech removed pts old brief which had duct tape holding it together. Pts soiled brief placed in trash and soiled underwear placed in pt belongings bag.   Pt washed clean with warm wipes and new brief applied followed by clean underwear, clean scrub pants, and the pts old pants on top. Pt wheeled back to his room. Bed cleaned and new sheet and chux placed. Pt made aware to press call light if he needed to get up again later. Bathroom cleaned after pts bowel movement.

## 2022-10-21 NOTE — ED Notes (Addendum)
First Nurse Note: Pt to ED via [facility] from the [place] [facility] at [facility]. C/o 8/10 Migraine and neck pain x 2 hours. Pt took 2 650 mg Tylenol and 3 200 mg ibuprofen's. Pt is still having pain.   VSS. Pt is in NAD.

## 2022-10-21 NOTE — ED Triage Notes (Signed)
Pt via [facility] from [facility]. Pt c/o headache and neck pain for the past 2 hours. States that he has taken OTC medication without any relief. Pt is A&Ox4 and NAD

## 2022-10-21 NOTE — ED Notes (Signed)
Pt had urinated all over himself. Tech took him to restroom and got him cleaned and in clean clothes.

## 2022-11-01 ENCOUNTER — Emergency Department
Admission: EM | Admit: 2022-11-01 | Discharge: 2022-11-01 | Disposition: A | Discharge status: Home / Self Care | Payer: 59 | Attending: Emergency Medicine | Admitting: Emergency Medicine

## 2022-11-01 DIAGNOSIS — I5032 Chronic diastolic (congestive) heart failure: Secondary | ICD-10-CM | POA: Insufficient documentation

## 2022-11-01 DIAGNOSIS — J449 Chronic obstructive pulmonary disease, unspecified: Secondary | ICD-10-CM | POA: Diagnosis not present

## 2022-11-01 DIAGNOSIS — R600 Localized edema: Secondary | ICD-10-CM | POA: Insufficient documentation

## 2022-11-01 DIAGNOSIS — R109 Unspecified abdominal pain: Secondary | ICD-10-CM

## 2022-11-01 DIAGNOSIS — Z8673 Personal history of transient ischemic attack (TIA), and cerebral infarction without residual deficits: Secondary | ICD-10-CM | POA: Diagnosis not present

## 2022-11-01 DIAGNOSIS — Z7984 Long term (current) use of oral hypoglycemic drugs: Secondary | ICD-10-CM | POA: Insufficient documentation

## 2022-11-01 DIAGNOSIS — Z7982 Long term (current) use of aspirin: Secondary | ICD-10-CM | POA: Insufficient documentation

## 2022-11-01 DIAGNOSIS — R197 Diarrhea, unspecified: Secondary | ICD-10-CM

## 2022-11-01 DIAGNOSIS — I110 Hypertensive heart disease with heart failure: Secondary | ICD-10-CM | POA: Insufficient documentation

## 2022-11-01 DIAGNOSIS — Z8616 Personal history of COVID-19: Secondary | ICD-10-CM | POA: Diagnosis not present

## 2022-11-01 DIAGNOSIS — Z5900 Homelessness unspecified: Secondary | ICD-10-CM | POA: Diagnosis not present

## 2022-11-01 DIAGNOSIS — E119 Type 2 diabetes mellitus without complications: Secondary | ICD-10-CM | POA: Diagnosis not present

## 2022-11-01 DIAGNOSIS — Z79899 Other long term (current) drug therapy: Secondary | ICD-10-CM | POA: Diagnosis not present

## 2022-11-01 LAB — GASTROINTESTINAL PANEL BY PCR, STOOL (REPLACES STOOL CULTURE)

## 2022-11-01 LAB — COMPREHENSIVE METABOLIC PANEL
ALT: 33 U/L (ref 0–44)
AST: 62 U/L — ABNORMAL HIGH (ref 15–41)
Albumin: 2.5 g/dL — ABNORMAL LOW (ref 3.5–5.0)
Alkaline Phosphatase: 147 U/L — ABNORMAL HIGH (ref 38–126)
Anion gap: 9 (ref 5–15)
BUN: 13 mg/dL (ref 8–23)
CO2: 21 mmol/L — ABNORMAL LOW (ref 22–32)
Calcium: 8.5 mg/dL — ABNORMAL LOW (ref 8.9–10.3)
Chloride: 111 mmol/L (ref 98–111)
Creatinine, Ser: 0.81 mg/dL (ref 0.61–1.24)
GFR, Estimated: >60 mL/min (ref >60)
Glucose, Bld: 79 mg/dL (ref 70–99)
Potassium: 3.6 mmol/L (ref 3.5–5.1)
Sodium: 141 mmol/L (ref 135–145)
Total Bilirubin: 2 mg/dL — ABNORMAL HIGH (ref 0.3–1.2)
Total Protein: 6.5 g/dL (ref 6.5–8.1)

## 2022-11-01 LAB — CBC WITH DIFFERENTIAL/PLATELET
Abs Immature Granulocytes: 0.02 10*3/uL (ref 0.00–0.07)
Basophils Absolute: 0.1 10*3/uL (ref 0.0–0.1)
Basophils Relative: 2 %
Eosinophils Absolute: 0.1 10*3/uL (ref 0.0–0.5)
Eosinophils Relative: 3 %
HCT: 35 % — ABNORMAL LOW (ref 39.0–52.0)
Hemoglobin: 11.5 g/dL — ABNORMAL LOW (ref 13.0–17.0)
Immature Granulocytes: 1 %
Lymphocytes Relative: 26 %
Lymphs Abs: 1 10*3/uL (ref 0.7–4.0)
MCH: 32 pg (ref 26.0–34.0)
MCHC: 32.9 g/dL (ref 30.0–36.0)
MCV: 97.5 fL (ref 80.0–100.0)
Monocytes Absolute: 0.5 10*3/uL (ref 0.1–1.0)
Monocytes Relative: 13 %
Neutro Abs: 2.2 10*3/uL (ref 1.7–7.7)
Neutrophils Relative %: 55 %
Platelets: 99 10*3/uL — ABNORMAL LOW (ref 150–400)
RBC: 3.59 MIL/uL — ABNORMAL LOW (ref 4.22–5.81)
RDW: 16 % — ABNORMAL HIGH (ref 11.5–15.5)
Smear Review: NORMAL
WBC: 3.9 10*3/uL — ABNORMAL LOW (ref 4.0–10.5)
nRBC: 0 % (ref 0.0–0.2)

## 2022-11-01 LAB — URINALYSIS, ROUTINE W REFLEX MICROSCOPIC
Bacteria, UA: NONE SEEN
Bilirubin Urine: NEGATIVE
Glucose, UA: NEGATIVE mg/dL
Hgb urine dipstick: NEGATIVE
Ketones, ur: NEGATIVE mg/dL
Leukocytes,Ua: NEGATIVE
Nitrite: NEGATIVE
Protein, ur: 30 mg/dL — AB
Specific Gravity, Urine: 1.013 (ref 1.005–1.030)
pH: 6 (ref 5.0–8.0)

## 2022-11-01 LAB — C DIFFICILE QUICK SCREEN W PCR REFLEX
C Diff antigen: NEGATIVE
C Diff interpretation: NOT DETECTED
C Diff toxin: NEGATIVE

## 2022-11-01 LAB — AMMONIA: Ammonia: 43 umol/L — ABNORMAL HIGH (ref 9–35)

## 2022-11-01 LAB — MAGNESIUM: Magnesium: 1.5 mg/dL — ABNORMAL LOW (ref 1.7–2.4)

## 2022-11-01 LAB — LIPASE, BLOOD: Lipase: 38 U/L (ref 11–51)

## 2022-11-01 MED ORDER — MORPHINE SULFATE (PF) 4 MG/ML IV SOLN
4.0000 mg | Freq: Once | INTRAVENOUS | Status: AC
  Administered 2022-11-01: 4 mg via INTRAVENOUS
  Filled 2022-11-01: qty 1

## 2022-11-01 MED ORDER — DIAZEPAM 5 MG/ML IJ SOLN
2.0000 mg | Freq: Once | INTRAMUSCULAR | Status: AC
  Administered 2022-11-01: 2 mg via INTRAVENOUS
  Filled 2022-11-01: qty 2

## 2022-11-01 MED ORDER — DICYCLOMINE HCL 20 MG PO TABS: 20.0000 mg | ORAL_TABLET | Freq: Four times a day (QID) | ORAL | 0 refills | Status: DC

## 2022-11-01 MED ORDER — MAGNESIUM SULFATE 2 GM/50ML IV SOLN
2.0000 g | Freq: Once | INTRAVENOUS | Status: AC
  Administered 2022-11-01: 2 g via INTRAVENOUS
  Filled 2022-11-01: qty 50

## 2022-11-01 MED ORDER — DICYCLOMINE HCL 20 MG PO TABS
20.0000 mg | ORAL_TABLET | Freq: Once | ORAL | Status: AC
  Administered 2022-11-01: 20 mg via ORAL
  Filled 2022-11-01: qty 1

## 2022-11-01 MED ORDER — ONDANSETRON HCL 4 MG/2ML IJ SOLN
4.0000 mg | Freq: Once | INTRAMUSCULAR | Status: AC
  Administered 2022-11-01: 4 mg via INTRAVENOUS
  Filled 2022-11-01: qty 2

## 2022-11-01 MED ORDER — SODIUM CHLORIDE 0.9 % IV BOLUS
500.0000 mL | Freq: Once | INTRAVENOUS | Status: AC
  Administered 2022-11-01: 500 mL via INTRAVENOUS

## 2022-11-01 NOTE — ED Triage Notes (Signed)
BIB EMS/ ABD pain, diarrhea x2 months after taking Lactulose for elevated ammonia/  hx of liver failure/ pt is homeless and lives in a tent

## 2022-11-01 NOTE — ED Provider Notes (Signed)
[facility] Provider Note    Event Date/Time   First MD Initiated Contact with Patient 11/01/22 0350     (approximate)   History   Diarrhea   HPI  [name] is a 72 y.o. male brought to the ED via EMS from home with a chief complaint of abdominal cramping and diarrhea.  Symptoms ongoing for the past 2 months after starting lactulose for liver disease.  States he would rather follow until, then continue to take lactulose.  Feels dehydrated.  Denies fever/chills, chest pain, shortness of breath, nausea, vomiting or dizziness.     Past Medical History   Past Medical History:  Diagnosis Date   Asthma    CHF (congestive heart failure) ([place])    COPD (chronic obstructive pulmonary disease) ([place])    COVID-19 03/2020   diagnosed in August 2021   Diabetes mellitus without complication ([place])    Homelessness    Hypertension    Infestation by bed bug    Migraine    Obesity    Sleep apnea      Active Problem List   Patient Active Problem List   Diagnosis Date Noted   Acute metabolic encephalopathy [id number]   Liver lesion 09/22/2022   Liver cirrhosis ([place]) 09/22/2022   HLD (hyperlipidemia) 09/22/2022   Diabetes mellitus without complication ([place]) [id number]   Obesity (BMI 30-39.9) 09/22/2022   Acute hepatic encephalopathy ([place]) 09/22/2022   MDD (major depressive disorder), recurrent severe, without psychosis ([place]) 09/17/2022   Major depressive disorder, 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 psychotic features ([place]) 09/15/2022   Protein-calorie malnutrition, severe ([place]) 08/31/2022   Hypotension 08/31/2022   Right sided weakness    Hx of transient ischemic attack (TIA) 04/28/2022   Obesity    Chronic diastolic CHF (congestive heart failure) ([place])    Depression    Sepsis ([place]) 03/28/2022   Bacteremia due to Klebsiella pneumoniae 03/28/2022   Hypoglycemia 03/28/2022   Syncope 03/24/2022   Hypokalemia 03/24/2022   Fever 03/24/2022   COPD (chronic  obstructive pulmonary disease) ([facility])    Elevated troponin    Abnormal LFTs    History of hepatitis C    AKI (acute kidney injury) ([place])    Pure hypercholesterolemia    Obesity, Class III, BMI 40-49.9 (morbid obesity) ([place]) 04/18/2020   Thrombocytopenia ([place]) 04/17/2020   Leukopenia 04/17/2020   HTN (hypertension) 04/17/2018   Uncontrolled type 2 diabetes mellitus with hyperglycemia, without long-term current use of insulin ([place]) 04/17/2018   Lymphedema 04/17/2018   Anemia 04/07/2018   Chest pain 03/20/2018   Unsheltered homelessness 12/16/2017     Past Surgical History   Past Surgical History:  Procedure Laterality Date   CARDIAC CATHETERIZATION     CHOLECYSTECTOMY     EYE SURGERY     INNER EAR SURGERY     NOSE SURGERY       Home Medications   Prior to Admission medications   Medication Sig Start Date End Date Taking? Authorizing Provider  albuterol (VENTOLIN HFA) 108 (90 Base) MCG/ACT inhaler Inhale 2 puffs into the lungs every 6 (six) hours as needed for wheezing or shortness of breath. 10/04/22 11/03/22  Parks Ranger, DO  ascorbic acid (VITAMIN C) 500 MG tablet Take 1 tablet (500 mg total) by mouth daily. 04/20/20   Samuella Cota, MD  aspirin EC 81 MG tablet Take 1 tablet (81 mg total) by mouth daily. Swallow whole. 10/04/22   Parks Ranger, DO  atorvastatin (LIPITOR) 40 MG tablet  Take 1 tablet (40 mg total) by mouth daily. 10/04/22   Parks Ranger, DO  citalopram (CELEXA) 20 MG tablet Take 1 tablet (20 mg total) by mouth daily. 10/04/22   Parks Ranger, DO  doxepin (SINEQUAN) 50 MG capsule Take 1 capsule (50 mg total) by mouth at bedtime. 10/04/22   Parks Ranger, DO  glucosamine-chondroitin 500-400 MG tablet Take 1 tablet by mouth 3 (three) times daily.    [provider]  hydrOXYzine (ATARAX) 10 MG tablet Take 1 tablet (10 mg total) by mouth 3 (three) times daily as needed for anxiety. 10/04/22   Parks Ranger,  DO  JARDIANCE 10 MG TABS tablet Take 1 tablet (10 mg total) by mouth daily. 04/26/22   Alisa Graff, FNP  lactulose (CHRONULAC) 10 GM/15ML solution Take 45 mLs (30 g total) by mouth QID. 10/04/22   Parks Ranger, DO  losartan (COZAAR) 50 MG tablet Take 1 tablet (50 mg total) by mouth daily after breakfast. 10/05/22   Parks Ranger, DO  metFORMIN (GLUCOPHAGE-XR) 500 MG 24 hr tablet Take 1 tablet (500 mg total) by mouth daily. 04/26/22   Alisa Graff, FNP  Multiple Vitamin (MULTIVITAMIN WITH MINERALS) TABS tablet Take 1 tablet by mouth daily.    [provider]  nitroGLYCERIN (NITROSTAT) 0.4 MG SL tablet Place 1 tablet (0.4 mg total) under the tongue every 5 (five) minutes x 3 doses as needed for chest pain. 10/04/22   Parks Ranger, DO  pantoprazole (PROTONIX) 20 MG tablet Take 1 tablet (20 mg total) by mouth daily. 10/04/22   Parks Ranger, DO  QUEtiapine (SEROQUEL) 50 MG tablet Take 1 tablet (50 mg total) by mouth at bedtime. 10/04/22   Parks Ranger, DO     Allergies  Bee venom, Codeine, Novocain [procaine], and Sulfa antibiotics   Family History   Family History  Problem Relation Age of Onset   Hypertension Mother    Heart disease Mother    Heart failure Maternal Grandmother      Physical Exam  Triage Vital Signs: ED Triage Vitals  Enc Vitals Group     BP 11/01/22 0342 (!) 160/71     Pulse Rate 11/01/22 0344 63     Resp 11/01/22 0342 19     Temp 11/01/22 0342 98.4 F (36.9 C)     Temp Source 11/01/22 0342 Oral     SpO2 11/01/22 0337 99 %     Weight 11/01/22 0343 207 lb 14.3 oz (94.3 kg)     Height --      Head Circumference --      Peak Flow --      Pain Score 11/01/22 0342 10     Pain Loc --      Pain Edu? --      Excl. in [place]? --     Updated Vital Signs: BP (!) 111/57   Pulse (!) 57   Temp 98.4 F (36.9 C) (Oral)   Resp 19   Wt 94.3 kg   SpO2 98%   BMI 33.55 kg/m    General: Awake, no distress.   CV:  RRR.  Good peripheral perfusion.  Resp:  Normal effort.  CTAB. Abd:  Nontender to light or deep palpation.  No distention.  Other:  No truncal vesicles.  Chronic BLE 1+ nonpitting edema.   ED Results / Procedures / Treatments  Labs (all labs ordered are listed, but only abnormal results are displayed) Labs Reviewed  CBC WITH DIFFERENTIAL/PLATELET - Abnormal; Notable for the following components:      Result Value   WBC 3.9 (*)    RBC 3.59 (*)    Hemoglobin 11.5 (*)    HCT 35.0 (*)    RDW 16.0 (*)    Platelets 99 (*)    All other components within normal limits  COMPREHENSIVE METABOLIC PANEL - Abnormal; Notable for the following components:   CO2 21 (*)    Calcium 8.5 (*)    Albumin 2.5 (*)    AST 62 (*)    Alkaline Phosphatase 147 (*)    Total Bilirubin 2.0 (*)    All other components within normal limits  AMMONIA - Abnormal; Notable for the following components:   Ammonia 43 (*)    All other components within normal limits  MAGNESIUM - Abnormal; Notable for the following components:   Magnesium 1.5 (*)    All other components within normal limits  URINALYSIS, ROUTINE W REFLEX MICROSCOPIC - Abnormal; Notable for the following components:   Color, Urine YELLOW (*)    APPearance CLEAR (*)    Protein, ur 30 (*)    All other components within normal limits  C DIFFICILE QUICK SCREEN W PCR REFLEX    GASTROINTESTINAL PANEL BY PCR, STOOL (REPLACES STOOL CULTURE)  LIPASE, BLOOD     EKG  None   RADIOLOGY None   Official radiology report(s): No results found.   PROCEDURES:  Critical Care performed: No  Procedures   MEDICATIONS ORDERED IN ED: Medications  dicyclomine (BENTYL) tablet 20 mg (has no administration in time range)  sodium chloride 0.9 % bolus 500 mL (0 mLs Intravenous Stopped 11/01/22 0503)  ondansetron (ZOFRAN) injection 4 mg (4 mg Intravenous Given 11/01/22 0428)  diazepam (VALIUM) injection 2 mg (2 mg Intravenous Given 11/01/22 0429)   magnesium sulfate IVPB 2 g 50 mL (0 g Intravenous Stopped 11/01/22 0559)  morphine (PF) 4 MG/ML injection 4 mg (4 mg Intravenous Given 11/01/22 0528)     IMPRESSION / MDM / ASSESSMENT AND PLAN / ED COURSE  I reviewed the triage vital signs and the nursing notes.                             72 year old male who presents with a 26-monthhistory of abdominal cramps and diarrhea since starting lactulose. Differential diagnosis includes, but is not limited to, acute appendicitis, renal colic, testicular torsion, urinary tract infection/pyelonephritis, prostatitis,  epididymitis, diverticulitis, small bowel obstruction or ileus, colitis, abdominal aortic aneurysm, gastroenteritis, hernia, etc. I have personally reviewed patient's records and note behavioral medicine hospitalization from 1/22-10/04/2022 for depression with suicidal ideation.  During that hospitalization he was noted to have increasing lethargy and found to have hepatic encephalopathy.  He was on this hospital services from 1/27-1/29 for lactulose treatment.  Patient's presentation is most consistent with exacerbation of chronic illness.  Will obtain lab work, ammonia level.  Administer gentle fluid hydration keeping in mind that patient has a history of CHF, IV Valium for abdominal cramping.  Will reassess.  Clinical Course as of 11/01/22 0719  Thu Nov 01, 2022  0633 Laboratory results remarkable for mild hypomagnesemia, intermittently elevated ammonia which is stable from prior.  Patient awake and conversant; no need for lactulose.  Pending C. difficile results at this time. [JS]  [id number]. difficile negative.  Will administer Bentyl for cramping and provide prescription at discharge.  Strict return precautions given.  Patient verbalizes understanding and agrees with plan of care. [JS]  0718 BioFire is negative. [JS]    Clinical Course User Index [JS] Paulette Blanch, MD     FINAL CLINICAL IMPRESSION(S) / ED DIAGNOSES   Final diagnoses:   Diarrhea, unspecified type  Abdominal cramps  Hypomagnesemia     Rx / DC Orders   ED Discharge Orders     None        Note:  This document was prepared using Dragon voice recognition software and may include unintentional dictation errors.   Paulette Blanch, MD 11/01/22 [phone]

## 2022-11-01 NOTE — Discharge Instructions (Addendum)
I encourage you to continue taking Lactulose as directed by your doctor.  Eat a BRAT diet x 3 days, then slowly advance diet as tolerated.  You may take Bentyl as needed for abdominal cramping.  Return to the ER for worsening symptoms, persistent vomiting, difficulty breathing or other concerns.

## 2022-11-05 ENCOUNTER — Inpatient Hospital Stay: Payer: 59 | Admitting: Anesthesiology

## 2022-11-05 ENCOUNTER — Inpatient Hospital Stay
Admission: EM | Admit: 2022-11-05 | Discharge: 2022-11-08 | DRG: 398 | Disposition: A | Discharge status: Home / Self Care | Payer: 59 | Attending: Internal Medicine | Admitting: Internal Medicine

## 2022-11-05 ENCOUNTER — Encounter: Payer: Self-pay | Admitting: Radiology

## 2022-11-05 ENCOUNTER — Emergency Department: Payer: 59

## 2022-11-05 ENCOUNTER — Encounter
Admission: EM | Disposition: A | Discharge status: Home / Self Care | Payer: Self-pay | Source: Home / Self Care | Attending: Internal Medicine

## 2022-11-05 DIAGNOSIS — Z8673 Personal history of transient ischemic attack (TIA), and cerebral infarction without residual deficits: Secondary | ICD-10-CM

## 2022-11-05 DIAGNOSIS — K766 Portal hypertension: Secondary | ICD-10-CM | POA: Diagnosis present

## 2022-11-05 DIAGNOSIS — R188 Other ascites: Secondary | ICD-10-CM

## 2022-11-05 DIAGNOSIS — Z79899 Other long term (current) drug therapy: Secondary | ICD-10-CM

## 2022-11-05 DIAGNOSIS — Z5986 Financial insecurity: Secondary | ICD-10-CM

## 2022-11-05 DIAGNOSIS — B1920 Unspecified viral hepatitis C without hepatic coma: Secondary | ICD-10-CM | POA: Diagnosis present

## 2022-11-05 DIAGNOSIS — E119 Type 2 diabetes mellitus without complications: Secondary | ICD-10-CM | POA: Diagnosis present

## 2022-11-05 DIAGNOSIS — R001 Bradycardia, unspecified: Secondary | ICD-10-CM | POA: Insufficient documentation

## 2022-11-05 DIAGNOSIS — Z882 Allergy status to sulfonamides status: Secondary | ICD-10-CM | POA: Diagnosis not present

## 2022-11-05 DIAGNOSIS — K7682 Hepatic encephalopathy: Secondary | ICD-10-CM | POA: Diagnosis present

## 2022-11-05 DIAGNOSIS — Z6841 Body Mass Index (BMI) 40.0 and over, adult: Secondary | ICD-10-CM

## 2022-11-05 DIAGNOSIS — K3580 Unspecified acute appendicitis: Principal | ICD-10-CM

## 2022-11-05 DIAGNOSIS — I10 Essential (primary) hypertension: Secondary | ICD-10-CM

## 2022-11-05 DIAGNOSIS — K7210 Chronic hepatic failure without coma: Secondary | ICD-10-CM | POA: Diagnosis present

## 2022-11-05 DIAGNOSIS — J439 Emphysema, unspecified: Secondary | ICD-10-CM

## 2022-11-05 DIAGNOSIS — D61818 Other pancytopenia: Secondary | ICD-10-CM | POA: Diagnosis present

## 2022-11-05 DIAGNOSIS — Z1152 Encounter for screening for COVID-19: Secondary | ICD-10-CM

## 2022-11-05 DIAGNOSIS — K838 Other specified diseases of biliary tract: Secondary | ICD-10-CM | POA: Diagnosis present

## 2022-11-05 DIAGNOSIS — G43909 Migraine, unspecified, not intractable, without status migrainosus: Secondary | ICD-10-CM | POA: Diagnosis present

## 2022-11-05 DIAGNOSIS — Z91030 Bee allergy status: Secondary | ICD-10-CM | POA: Diagnosis not present

## 2022-11-05 DIAGNOSIS — J111 Influenza due to unidentified influenza virus with other respiratory manifestations: Principal | ICD-10-CM

## 2022-11-05 DIAGNOSIS — Z23 Encounter for immunization: Secondary | ICD-10-CM

## 2022-11-05 DIAGNOSIS — Z885 Allergy status to narcotic agent status: Secondary | ICD-10-CM

## 2022-11-05 DIAGNOSIS — Z8616 Personal history of COVID-19: Secondary | ICD-10-CM | POA: Diagnosis not present

## 2022-11-05 DIAGNOSIS — J449 Chronic obstructive pulmonary disease, unspecified: Secondary | ICD-10-CM | POA: Diagnosis present

## 2022-11-05 DIAGNOSIS — I110 Hypertensive heart disease with heart failure: Secondary | ICD-10-CM | POA: Diagnosis present

## 2022-11-05 DIAGNOSIS — Z7982 Long term (current) use of aspirin: Secondary | ICD-10-CM

## 2022-11-05 DIAGNOSIS — I5032 Chronic diastolic (congestive) heart failure: Secondary | ICD-10-CM | POA: Diagnosis present

## 2022-11-05 DIAGNOSIS — Z5902 Unsheltered homelessness: Secondary | ICD-10-CM

## 2022-11-05 DIAGNOSIS — E1169 Type 2 diabetes mellitus with other specified complication: Secondary | ICD-10-CM

## 2022-11-05 DIAGNOSIS — K7460 Unspecified cirrhosis of liver: Secondary | ICD-10-CM | POA: Diagnosis present

## 2022-11-05 DIAGNOSIS — Z8249 Family history of ischemic heart disease and other diseases of the circulatory system: Secondary | ICD-10-CM

## 2022-11-05 DIAGNOSIS — Z87891 Personal history of nicotine dependence: Secondary | ICD-10-CM

## 2022-11-05 DIAGNOSIS — Z7984 Long term (current) use of oral hypoglycemic drugs: Secondary | ICD-10-CM

## 2022-11-05 HISTORY — PX: XI ROBOTIC LAPAROSCOPIC ASSISTED APPENDECTOMY: SHX6877

## 2022-11-05 LAB — CBC
HCT: 32 % — ABNORMAL LOW (ref 39.0–52.0)
Hemoglobin: 10.4 g/dL — ABNORMAL LOW (ref 13.0–17.0)
MCH: 32.3 pg (ref 26.0–34.0)
MCHC: 32.5 g/dL (ref 30.0–36.0)
MCV: 99.4 fL (ref 80.0–100.0)
Platelets: 98 10*3/uL — ABNORMAL LOW (ref 150–400)
RBC: 3.22 MIL/uL — ABNORMAL LOW (ref 4.22–5.81)
RDW: 15.6 % — ABNORMAL HIGH (ref 11.5–15.5)
WBC: 2.7 10*3/uL — ABNORMAL LOW (ref 4.0–10.5)
nRBC: 0 % (ref 0.0–0.2)

## 2022-11-05 LAB — CBC WITH DIFFERENTIAL/PLATELET
Abs Immature Granulocytes: 0 10*3/uL (ref 0.00–0.07)
Basophils Absolute: 0 10*3/uL (ref 0.0–0.1)
Basophils Relative: 2 %
Eosinophils Absolute: 0.1 10*3/uL (ref 0.0–0.5)
Eosinophils Relative: 5 %
HCT: 31 % — ABNORMAL LOW (ref 39.0–52.0)
Hemoglobin: 10.3 g/dL — ABNORMAL LOW (ref 13.0–17.0)
Immature Granulocytes: 0 %
Lymphocytes Relative: 36 %
Lymphs Abs: 0.9 10*3/uL (ref 0.7–4.0)
MCH: 32.7 pg (ref 26.0–34.0)
MCHC: 33.2 g/dL (ref 30.0–36.0)
MCV: 98.4 fL (ref 80.0–100.0)
Monocytes Absolute: 0.4 10*3/uL (ref 0.1–1.0)
Monocytes Relative: 15 %
Neutro Abs: 1.1 10*3/uL — ABNORMAL LOW (ref 1.7–7.7)
Neutrophils Relative %: 42 %
Platelets: 100 10*3/uL — ABNORMAL LOW (ref 150–400)
RBC: 3.15 MIL/uL — ABNORMAL LOW (ref 4.22–5.81)
RDW: 15.8 % — ABNORMAL HIGH (ref 11.5–15.5)
WBC: 2.5 10*3/uL — ABNORMAL LOW (ref 4.0–10.5)
nRBC: 0 % (ref 0.0–0.2)

## 2022-11-05 LAB — COMPREHENSIVE METABOLIC PANEL
ALT: 23 U/L (ref 0–44)
AST: 38 U/L (ref 15–41)
Albumin: 2.2 g/dL — ABNORMAL LOW (ref 3.5–5.0)
Alkaline Phosphatase: 119 U/L (ref 38–126)
Anion gap: 5 (ref 5–15)
BUN: 14 mg/dL (ref 8–23)
CO2: 23 mmol/L (ref 22–32)
Calcium: 9 mg/dL (ref 8.9–10.3)
Chloride: 110 mmol/L (ref 98–111)
Creatinine, Ser: 0.88 mg/dL (ref 0.61–1.24)
GFR, Estimated: >60 mL/min (ref >60)
Glucose, Bld: 89 mg/dL (ref 70–99)
Potassium: 4 mmol/L (ref 3.5–5.1)
Sodium: 138 mmol/L (ref 135–145)
Total Bilirubin: 2 mg/dL — ABNORMAL HIGH (ref 0.3–1.2)
Total Protein: 5.8 g/dL — ABNORMAL LOW (ref 6.5–8.1)

## 2022-11-05 LAB — LIPASE, BLOOD: Lipase: 34 U/L (ref 11–51)

## 2022-11-05 LAB — RESP PANEL BY RT-PCR (RSV, FLU A&B, COVID)  RVPGX2
Influenza A by PCR: NEGATIVE
Influenza B by PCR: NEGATIVE
Resp Syncytial Virus by PCR: NEGATIVE
SARS Coronavirus 2 by RT PCR: NEGATIVE

## 2022-11-05 LAB — PROTIME-INR
INR: 1.3 — ABNORMAL HIGH (ref 0.8–1.2)
Prothrombin Time: 16.3 seconds — ABNORMAL HIGH (ref 11.4–15.2)

## 2022-11-05 LAB — GLUCOSE, CAPILLARY
Glucose-Capillary: 55 mg/dL — ABNORMAL LOW (ref 70–99)
Glucose-Capillary: 89 mg/dL (ref 70–99)

## 2022-11-05 LAB — MAGNESIUM: Magnesium: 1.6 mg/dL — ABNORMAL LOW (ref 1.7–2.4)

## 2022-11-05 SURGERY — APPENDECTOMY, ROBOT-ASSISTED, LAPAROSCOPIC
Anesthesia: General

## 2022-11-05 MED ORDER — METOCLOPRAMIDE HCL 5 MG/ML IJ SOLN
10.0000 mg | INTRAMUSCULAR | Status: AC
  Administered 2022-11-05: 10 mg via INTRAVENOUS
  Filled 2022-11-05: qty 2

## 2022-11-05 MED ORDER — DOXEPIN HCL 50 MG PO CAPS
50.0000 mg | ORAL_CAPSULE | Freq: Every day | ORAL | Status: DC
  Administered 2022-11-06 – 2022-11-07 (×2): 50 mg via ORAL
  Filled 2022-11-05 (×3): qty 1

## 2022-11-05 MED ORDER — SODIUM CHLORIDE 0.9 % IV SOLN: INTRAVENOUS | Status: DC | PRN

## 2022-11-05 MED ORDER — ONDANSETRON HCL 4 MG PO TABS: 4.0000 mg | ORAL_TABLET | Freq: Four times a day (QID) | ORAL | Status: DC | PRN

## 2022-11-05 MED ORDER — SODIUM CHLORIDE 0.9 % IV BOLUS
1000.0000 mL | Freq: Once | INTRAVENOUS | Status: AC
  Administered 2022-11-05: 1000 mL via INTRAVENOUS

## 2022-11-05 MED ORDER — KETOROLAC TROMETHAMINE 15 MG/ML IJ SOLN
7.5000 mg | Freq: Once | INTRAMUSCULAR | Status: AC
  Administered 2022-11-05: 7.5 mg via INTRAVENOUS
  Filled 2022-11-05: qty 1

## 2022-11-05 MED ORDER — SODIUM CHLORIDE 0.9% FLUSH
3.0000 mL | Freq: Two times a day (BID) | INTRAVENOUS | Status: DC
  Administered 2022-11-05 – 2022-11-08 (×7): 3 mL via INTRAVENOUS

## 2022-11-05 MED ORDER — GLYCOPYRROLATE 0.2 MG/ML IJ SOLN
INTRAMUSCULAR | Status: AC
  Filled 2022-11-05: qty 1

## 2022-11-05 MED ORDER — QUETIAPINE FUMARATE 25 MG PO TABS
50.0000 mg | ORAL_TABLET | Freq: Every day | ORAL | Status: DC
  Administered 2022-11-06 – 2022-11-07 (×2): 50 mg via ORAL
  Filled 2022-11-05 (×2): qty 2

## 2022-11-05 MED ORDER — CEFAZOLIN SODIUM 1 G IJ SOLR
INTRAMUSCULAR | Status: AC
  Filled 2022-11-05: qty 20

## 2022-11-05 MED ORDER — PROPOFOL 10 MG/ML IV BOLUS
INTRAVENOUS | Status: AC
  Filled 2022-11-05: qty 20

## 2022-11-05 MED ORDER — LOSARTAN POTASSIUM 50 MG PO TABS
50.0000 mg | ORAL_TABLET | Freq: Every day | ORAL | Status: DC
  Administered 2022-11-06 – 2022-11-08 (×3): 50 mg via ORAL
  Filled 2022-11-05 (×3): qty 1

## 2022-11-05 MED ORDER — IPRATROPIUM-ALBUTEROL 0.5-2.5 (3) MG/3ML IN SOLN: 3.0000 mL | Freq: Four times a day (QID) | RESPIRATORY_TRACT | Status: DC | PRN

## 2022-11-05 MED ORDER — DEXAMETHASONE SODIUM PHOSPHATE 10 MG/ML IJ SOLN
INTRAMUSCULAR | Status: AC
  Filled 2022-11-05: qty 1

## 2022-11-05 MED ORDER — PANTOPRAZOLE SODIUM 20 MG PO TBEC
20.0000 mg | DELAYED_RELEASE_TABLET | Freq: Every day | ORAL | Status: DC
  Administered 2022-11-06 – 2022-11-08 (×3): 20 mg via ORAL
  Filled 2022-11-05 (×3): qty 1

## 2022-11-05 MED ORDER — HYDROMORPHONE HCL 1 MG/ML IJ SOLN
0.5000 mg | INTRAMUSCULAR | Status: DC | PRN
  Administered 2022-11-05 – 2022-11-06 (×4): 1 mg via INTRAVENOUS
  Filled 2022-11-05 (×4): qty 1

## 2022-11-05 MED ORDER — DEXAMETHASONE SODIUM PHOSPHATE 10 MG/ML IJ SOLN
INTRAMUSCULAR | Status: DC | PRN
  Administered 2022-11-05: 10 mg via INTRAVENOUS

## 2022-11-05 MED ORDER — ACETAMINOPHEN 325 MG PO TABS: 650.0000 mg | ORAL_TABLET | Freq: Four times a day (QID) | ORAL | Status: DC | PRN

## 2022-11-05 MED ORDER — LACTULOSE 10 GM/15ML PO SOLN: 20.0000 g | Freq: Three times a day (TID) | ORAL | Status: DC

## 2022-11-05 MED ORDER — CITALOPRAM HYDROBROMIDE 20 MG PO TABS
20.0000 mg | ORAL_TABLET | Freq: Every day | ORAL | Status: DC
  Administered 2022-11-06 – 2022-11-08 (×3): 20 mg via ORAL
  Filled 2022-11-05 (×3): qty 1

## 2022-11-05 MED ORDER — FENTANYL CITRATE (PF) 100 MCG/2ML IJ SOLN
INTRAMUSCULAR | Status: AC
  Filled 2022-11-05: qty 2

## 2022-11-05 MED ORDER — ONDANSETRON HCL 4 MG/2ML IJ SOLN
INTRAMUSCULAR | Status: DC | PRN
  Administered 2022-11-05: 4 mg via INTRAVENOUS

## 2022-11-05 MED ORDER — ONDANSETRON HCL 4 MG/2ML IJ SOLN: 4.0000 mg | Freq: Four times a day (QID) | INTRAMUSCULAR | Status: DC | PRN

## 2022-11-05 MED ORDER — PIPERACILLIN-TAZOBACTAM 3.375 G IVPB 30 MIN
3.3750 g | Freq: Once | INTRAVENOUS | Status: AC
  Administered 2022-11-05: 3.375 g via INTRAVENOUS
  Filled 2022-11-05: qty 50

## 2022-11-05 MED ORDER — SODIUM CHLORIDE (PF) 0.9 % IJ SOLN
INTRAMUSCULAR | Status: AC
  Filled 2022-11-05: qty 20

## 2022-11-05 MED ORDER — PROPOFOL 10 MG/ML IV BOLUS
INTRAVENOUS | Status: DC | PRN
  Administered 2022-11-05: 100 mg via INTRAVENOUS

## 2022-11-05 MED ORDER — EPHEDRINE 5 MG/ML INJ
INTRAVENOUS | Status: AC
  Filled 2022-11-05: qty 5

## 2022-11-05 MED ORDER — DEXTROSE 50 % IV SOLN
12.5000 g | INTRAVENOUS | Status: AC
  Administered 2022-11-05: 12.5 g via INTRAVENOUS

## 2022-11-05 MED ORDER — EPHEDRINE SULFATE (PRESSORS) 50 MG/ML IJ SOLN
INTRAMUSCULAR | Status: DC | PRN
  Administered 2022-11-05 (×2): 5 mg via INTRAVENOUS

## 2022-11-05 MED ORDER — INSULIN ASPART 100 UNIT/ML IJ SOLN
0.0000 [IU] | Freq: Three times a day (TID) | INTRAMUSCULAR | Status: DC
  Administered 2022-11-06: 2 [IU] via SUBCUTANEOUS
  Administered 2022-11-06: 5 [IU] via SUBCUTANEOUS
  Administered 2022-11-06 – 2022-11-07 (×2): 1 [IU] via SUBCUTANEOUS
  Administered 2022-11-07: 2 [IU] via SUBCUTANEOUS
  Administered 2022-11-07: 1 [IU] via SUBCUTANEOUS
  Administered 2022-11-08: 2 [IU] via SUBCUTANEOUS
  Filled 2022-11-05 (×7): qty 1

## 2022-11-05 MED ORDER — CEFAZOLIN SODIUM-DEXTROSE 2-3 GM-%(50ML) IV SOLR
INTRAVENOUS | Status: DC | PRN
  Administered 2022-11-05: 2 g via INTRAVENOUS

## 2022-11-05 MED ORDER — ACETAMINOPHEN 650 MG RE SUPP: 650.0000 mg | Freq: Four times a day (QID) | RECTAL | Status: DC | PRN

## 2022-11-05 MED ORDER — FENTANYL CITRATE (PF) 100 MCG/2ML IJ SOLN
INTRAMUSCULAR | Status: DC | PRN
  Administered 2022-11-05: 100 ug via INTRAVENOUS

## 2022-11-05 MED ORDER — PANTOPRAZOLE SODIUM 40 MG IV SOLR
40.0000 mg | Freq: Once | INTRAVENOUS | Status: AC
  Administered 2022-11-05: 40 mg via INTRAVENOUS
  Filled 2022-11-05: qty 10

## 2022-11-05 MED ORDER — ROCURONIUM BROMIDE 100 MG/10ML IV SOLN
INTRAVENOUS | Status: DC | PRN
  Administered 2022-11-05: 60 mg via INTRAVENOUS
  Administered 2022-11-05: 10 mg via INTRAVENOUS

## 2022-11-05 MED ORDER — BUPIVACAINE-EPINEPHRINE (PF) 0.5% -1:200000 IJ SOLN
INTRAMUSCULAR | Status: DC | PRN
  Administered 2022-11-05: 30 mL via PERINEURAL

## 2022-11-05 MED ORDER — ROCURONIUM BROMIDE 10 MG/ML (PF) SYRINGE
PREFILLED_SYRINGE | INTRAVENOUS | Status: AC
  Filled 2022-11-05: qty 10

## 2022-11-05 MED ORDER — ONDANSETRON HCL 4 MG/2ML IJ SOLN
INTRAMUSCULAR | Status: AC
  Filled 2022-11-05: qty 2

## 2022-11-05 MED ORDER — LIDOCAINE HCL (CARDIAC) PF 100 MG/5ML IV SOSY
PREFILLED_SYRINGE | INTRAVENOUS | Status: DC | PRN
  Administered 2022-11-05: 80 mg via INTRAVENOUS

## 2022-11-05 MED ORDER — LIDOCAINE HCL (PF) 2 % IJ SOLN
INTRAMUSCULAR | Status: AC
  Filled 2022-11-05: qty 5

## 2022-11-05 MED ORDER — IOHEXOL 300 MG/ML  SOLN
100.0000 mL | Freq: Once | INTRAMUSCULAR | Status: AC | PRN
  Administered 2022-11-05: 100 mL via INTRAVENOUS

## 2022-11-05 MED ORDER — MAGNESIUM SULFATE 2 GM/50ML IV SOLN
2.0000 g | Freq: Once | INTRAVENOUS | Status: AC
  Administered 2022-11-05: 2 g via INTRAVENOUS
  Filled 2022-11-05: qty 50

## 2022-11-05 MED ORDER — DEXTROSE 50 % IV SOLN
INTRAVENOUS | Status: AC
  Filled 2022-11-05: qty 50

## 2022-11-05 SURGICAL SUPPLY — 71 items
ADH SKN CLS APL DERMABOND .7 (GAUZE/BANDAGES/DRESSINGS) ×1
BAG PRESSURE INF REUSE 1000 (BAG) IMPLANT
BLADE SURG SZ11 CARB STEEL (BLADE) ×1 IMPLANT
CANNULA REDUC XI 12-8 STAPL (CANNULA) ×1
CANNULA REDUCER 12-8 DVNC XI (CANNULA) ×1 IMPLANT
COVER TIP SHEARS 8 DVNC (MISCELLANEOUS) ×1 IMPLANT
COVER TIP SHEARS 8MM DA VINCI (MISCELLANEOUS) ×1
DERMABOND ADVANCED .7 DNX12 (GAUZE/BANDAGES/DRESSINGS) ×1 IMPLANT
DRAPE ARM DVNC X/XI (DISPOSABLE) ×4 IMPLANT
DRAPE COLUMN DVNC XI (DISPOSABLE) ×1 IMPLANT
DRAPE DA VINCI XI ARM (DISPOSABLE) ×4
DRAPE DA VINCI XI COLUMN (DISPOSABLE) ×1
ELECT REM PT RETURN 9FT ADLT (ELECTROSURGICAL) ×1
ELECTRODE REM PT RTRN 9FT ADLT (ELECTROSURGICAL) ×1 IMPLANT
GLOVE BIOGEL PI IND STRL 6.5 (GLOVE) ×2 IMPLANT
GLOVE SURG SYN 6.5 ES PF (GLOVE) ×3 IMPLANT
GLOVE SURG SYN 6.5 PF PI (GLOVE) ×2 IMPLANT
GOWN STRL REUS W/ TWL LRG LVL3 (GOWN DISPOSABLE) ×3 IMPLANT
GOWN STRL REUS W/TWL LRG LVL3 (GOWN DISPOSABLE) ×3
GRASPER SUT TROCAR 14GX15 (MISCELLANEOUS) IMPLANT
IRRIGATOR SUCT 8 DISP DVNC XI (IRRIGATION / IRRIGATOR) IMPLANT
IRRIGATOR SUCTION 8MM XI DISP (IRRIGATION / IRRIGATOR)
IV NS 1000ML (IV SOLUTION)
IV NS 1000ML BAXH (IV SOLUTION) IMPLANT
KIT PINK PAD W/HEAD ARE REST (MISCELLANEOUS) ×1 IMPLANT
KIT PINK PAD W/HEAD ARM REST (MISCELLANEOUS) ×1 IMPLANT
LABEL OR SOLS (LABEL) IMPLANT
MANIFOLD NEPTUNE II (INSTRUMENTS) ×1 IMPLANT
NDL FILTER BLUNT 18X1 1/2 (NEEDLE) IMPLANT
NDL INSUFFLATION 14GA 120MM (NEEDLE) ×1 IMPLANT
NEEDLE FILTER BLUNT 18X1 1/2 (NEEDLE) ×1 IMPLANT
NEEDLE HYPO 22GX1.5 SAFETY (NEEDLE) ×1 IMPLANT
NEEDLE INSUFFLATION 14GA 120MM (NEEDLE) ×1 IMPLANT
OBTURATOR OPTICAL STANDARD 8MM (TROCAR) ×1
OBTURATOR OPTICAL STND 8 DVNC (TROCAR) ×1
OBTURATOR OPTICALSTD 8 DVNC (TROCAR) ×1 IMPLANT
PACK LAP CHOLECYSTECTOMY (MISCELLANEOUS) ×1 IMPLANT
RELOAD STAPLE 45 2.5 WHT DVNC (STAPLE) IMPLANT
RELOAD STAPLE 45 3.5 BLU DVNC (STAPLE) IMPLANT
RELOAD STAPLER 2.5X45 WHT DVNC (STAPLE) IMPLANT
RELOAD STAPLER 3.5X45 BLU DVNC (STAPLE) IMPLANT
SEAL CANN UNIV 5-8 DVNC XI (MISCELLANEOUS) ×3 IMPLANT
SEAL XI 5MM-8MM UNIVERSAL (MISCELLANEOUS) ×3
SEALER VESSEL DA VINCI XI (MISCELLANEOUS)
SEALER VESSEL EXT DVNC XI (MISCELLANEOUS) IMPLANT
SET TUBE SMOKE EVAC HIGH FLOW (TUBING) ×1 IMPLANT
SLEEVE SCD COMPRESS KNEE LRG (STOCKING) IMPLANT
SOL ELECTROSURG ANTI STICK (MISCELLANEOUS) ×1
SOLUTION ELECTROSURG ANTI STCK (MISCELLANEOUS) ×1 IMPLANT
SPIKE FLUID TRANSFER (MISCELLANEOUS) IMPLANT
SPONGE T-LAP 4X18 ~~LOC~~+RFID (SPONGE) ×1 IMPLANT
STAPLER 45 DA VINCI SURE FORM (STAPLE)
STAPLER 45 SUREFORM DVNC (STAPLE) IMPLANT
STAPLER CANNULA SEAL DVNC XI (STAPLE) ×1 IMPLANT
STAPLER CANNULA SEAL XI (STAPLE) ×1
STAPLER RELOAD 2.5X45 WHITE (STAPLE)
STAPLER RELOAD 2.5X45 WHT DVNC (STAPLE)
STAPLER RELOAD 3.5X45 BLU DVNC (STAPLE)
STAPLER RELOAD 3.5X45 BLUE (STAPLE)
SUT MNCRL AB 4-0 PS2 18 (SUTURE) ×1 IMPLANT
SUT VIC AB 2-0 SH 27 (SUTURE) ×1
SUT VIC AB 2-0 SH 27XBRD (SUTURE) ×1 IMPLANT
SUT VICRYL 0 UR6 27IN ABS (SUTURE) ×1 IMPLANT
SUT VLOC 90 6 CV-15 VIOLET (SUTURE) ×1 IMPLANT
SYR 30ML LL (SYRINGE) ×1 IMPLANT
SYR TB 1ML 27GX1/2 LL (SYRINGE) IMPLANT
SYS BAG RETRIEVAL 10MM (BASKET) ×1
SYSTEM BAG RETRIEVAL 10MM (BASKET) ×1 IMPLANT
TRAP FLUID SMOKE EVACUATOR (MISCELLANEOUS) ×1 IMPLANT
TRAY FOLEY MTR SLVR 16FR STAT (SET/KITS/TRAYS/PACK) IMPLANT
WATER STERILE IRR 500ML POUR (IV SOLUTION) ×1 IMPLANT

## 2022-11-05 NOTE — ED Provider Notes (Signed)
[facility] Provider Note    Event Date/Time   First MD Initiated Contact with Patient 11/05/22 1138     (approximate)   History   Chief Complaint: Emesis   HPI  [name] is a 72 y.o. male with a history of hypertension, COPD, diabetes, CHF, homelessness who comes ED complaining of vomiting and diarrhea, generalized bodyaches and abdominal pain, and bilateral frontal headache.  He reports that it all started gradually this morning on waking up and has been worsening and persistent throughout the day.     Physical Exam   Triage Vital Signs: ED Triage Vitals  Enc Vitals Group     BP 11/05/22 1127 (!) 112/47     Pulse Rate 11/05/22 1127 (!) 47     Resp 11/05/22 1127 17     Temp 11/05/22 1127 98 F (36.7 C)     Temp Source 11/05/22 1127 Oral     SpO2 11/05/22 1127 95 %     Weight 11/05/22 1126 202 lb 13.2 oz (92 kg)     Height --      Head Circumference --      Peak Flow --      Pain Score 11/05/22 1126 0     Pain Loc --      Pain Edu? --      Excl. in [place]? --     Most recent vital signs: Vitals:   11/05/22 1305 11/05/22 1517  BP: (!) 116/54 (!) 114/50  Pulse: (!) 48 (!) 55  Resp: 18 17  Temp:  98.4 F (36.9 C)  SpO2: 99% 98%    General: Awake, no distress.  CV:  Good peripheral perfusion.  Regular rate and rhythm Resp:  Normal effort.  Clear to auscultation bilaterally Abd:  No distention.  Soft, mild upper abdominal tenderness Other:  Dry oral mucosa.  No lower extremity edema.   ED Results / Procedures / Treatments   Labs (all labs ordered are listed, but only abnormal results are displayed) Labs Reviewed  COMPREHENSIVE METABOLIC PANEL - Abnormal; Notable for the following components:      Result Value   Total Protein 5.8 (*)    Albumin 2.2 (*)    Total Bilirubin 2.0 (*)    All other components within normal limits  CBC - Abnormal; Notable for the following components:   WBC 2.7 (*)    RBC 3.22 (*)    Hemoglobin  10.4 (*)    HCT 32.0 (*)    RDW 15.6 (*)    Platelets 98 (*)    All other components within normal limits  RESP PANEL BY RT-PCR (RSV, FLU A&B, COVID)  [id number]  LIPASE, BLOOD  URINALYSIS, ROUTINE W REFLEX MICROSCOPIC     EKG    RADIOLOGY CT abdomen pelvis pending   PROCEDURES:  Procedures   MEDICATIONS ORDERED IN ED: Medications  metoCLOPramide (REGLAN) injection 10 mg (10 mg Intravenous Given 11/05/22 1254)  ketorolac (TORADOL) 15 MG/ML injection 7.5 mg (7.5 mg Intravenous Given 11/05/22 1254)  sodium chloride 0.9 % bolus 1,000 mL (0 mLs Intravenous Stopped 11/05/22 1430)  pantoprazole (PROTONIX) injection 40 mg (40 mg Intravenous Given 11/05/22 1254)     IMPRESSION / MDM / ASSESSMENT AND PLAN / ED COURSE  I reviewed the triage vital signs and the nursing notes.  DDx: COVID/flu, dehydration, AKI, electrolyte abnormality, anemia, pancreatitis, migraine, malingering  Patient's presentation is most consistent with acute presentation with potential threat to life or bodily  function.  Patient presents with generalized aching, fatigue.  Reports vomiting and diarrhea though during ED time he has not had any episodes.  He was given IV fluids for hydration, Toradol Reglan Protonix without improvement.  Will obtain CT abdomen pelvis, viral swab.       FINAL CLINICAL IMPRESSION(S) / ED DIAGNOSES   Final diagnoses:  Influenza-like illness     Rx / DC Orders   ED Discharge Orders     None        Note:  This document was prepared using Dragon voice recognition software and may include unintentional dictation errors.   Carrie Mew, MD 11/05/22 [phone]

## 2022-11-05 NOTE — Progress Notes (Signed)
Patient arrived to floor via w/c.  Ambulated to bed with cane and was oriented to room and call bell.  Patient alert and oriented and states is awaiting surgery.  No orders currently. Given gown and urinal for sample.  Patient states needs to rest before changing to gown.

## 2022-11-05 NOTE — Anesthesia Procedure Notes (Signed)
Procedure Name: Intubation Date/Time: 11/05/2022 10:55 PM  Performed by: Demetrius Charity, CRNAPre-anesthesia Checklist: Patient identified, Patient being monitored, Timeout performed, Emergency Drugs available and Suction available Patient Re-evaluated:Patient Re-evaluated prior to induction Oxygen Delivery Method: Circle system utilized Preoxygenation: Pre-oxygenation with 100% oxygen Induction Type: IV induction Ventilation: Mask ventilation without difficulty Laryngoscope Size: McGraph and 4 Grade View: Grade I Tube type: Oral Tube size: 7.5 mm Number of attempts: 1 Airway Equipment and Method: Stylet Placement Confirmation: ETT inserted through vocal cords under direct vision, positive ETCO2 and breath sounds checked- equal and bilateral Secured at: 25 cm Tube secured with: Tape Dental Injury: Teeth and Oropharynx as per pre-operative assessment

## 2022-11-05 NOTE — Assessment & Plan Note (Signed)
Patient appears essentially euvolemic at this time with only minimal lower extremity edema but no pulmonary edema.  - Monitor volume status while admitted

## 2022-11-05 NOTE — ED Triage Notes (Signed)
Pt arrives via EMS from [place]. Per EMS pt has been having vomiting with diarrhea since his medication change that pt sts from the ER. Pt is resting on stretcher at this time.

## 2022-11-05 NOTE — Consult Note (Signed)
PHARMACY -  BRIEF ANTIBIOTIC NOTE   Pharmacy has received consult(s) for Zosyn from an ED provider.  The patient's profile has been reviewed for ht/wt/allergies/indication/available labs.    One time order(s) placed for Zosyn 3.375g IV x 1 dose.  Further antibiotics/pharmacy consults should be ordered by admitting physician if indicated.                       Thank you, Pearla Dubonnet 11/05/2022  5:16 PM

## 2022-11-05 NOTE — Assessment & Plan Note (Signed)
Continue home antihypertensives 

## 2022-11-05 NOTE — Progress Notes (Signed)
Patient ID: [name], male   DOB: 1951-03-10, 72 y.o.   MRN: [id number]   SURGICAL CONSULTATION NOTE   HISTORY OF PRESENT ILLNESS (HPI):  72 y.o. male presented to [facility] ED for evaluation of vomiting. Patient reports he started having migraines 3 days ago.  Then eventually he started developing diarrhea and now started having abdominal pain.  Abdominal pain localized to the right lower quadrant.  No radiation.  Aggravating factors moving abdominal wall.  No alleviating factors.  He came to the ED today for worsening diarrhea and abdominal pain.  At the ED he was found with leukopenia which is chronic.  He was found with stable hemoglobin.  Platelets 98.  He was also found with bilirubin of 2.0 which is slightly higher than his usual.  He had a CT scan of the abdomen and pelvis that shows an enlarged appendix with adjacent fat stranding.  There is no free air.  There is slight amount of ascites which is also new compared to previous CT scan.  I personally evaluated the images.  Surgery is consulted by Dr. Ellender Hose in this context for evaluation and management of acute appendicitis.  PAST MEDICAL HISTORY (PMH):  Past Medical History:  Diagnosis Date   Asthma    CHF (congestive heart failure) (HCC)    COPD (chronic obstructive pulmonary disease) ([place])    COVID-19 03/2020   diagnosed in August 2021   Diabetes mellitus without complication ([place])    Homelessness    Hypertension    Infestation by bed bug    Migraine    Obesity    Sleep apnea      PAST SURGICAL HISTORY ([place]):  Past Surgical History:  Procedure Laterality Date   CARDIAC CATHETERIZATION     CHOLECYSTECTOMY     EYE SURGERY     INNER EAR SURGERY     NOSE SURGERY       MEDICATIONS:  Prior to Admission medications   Medication Sig Start Date End Date Taking? Authorizing Provider  albuterol (VENTOLIN HFA) 108 (90 Base) MCG/ACT inhaler Inhale 2 puffs into the lungs every 6 (six) hours as needed for wheezing or shortness of  breath. 10/04/22 11/03/22  Parks Ranger, DO  ascorbic acid (VITAMIN C) 500 MG tablet Take 1 tablet (500 mg total) by mouth daily. 04/20/20   Samuella Cota, MD  aspirin EC 81 MG tablet Take 1 tablet (81 mg total) by mouth daily. Swallow whole. 10/04/22   Parks Ranger, DO  atorvastatin (LIPITOR) 40 MG tablet Take 1 tablet (40 mg total) by mouth daily. 10/04/22   Parks Ranger, DO  citalopram (CELEXA) 20 MG tablet Take 1 tablet (20 mg total) by mouth daily. 10/04/22   Parks Ranger, DO  dicyclomine (BENTYL) 20 MG tablet Take 1 tablet (20 mg total) by mouth every 6 (six) hours. 11/01/22   Paulette Blanch, MD  doxepin (SINEQUAN) 50 MG capsule Take 1 capsule (50 mg total) by mouth at bedtime. 10/04/22   Parks Ranger, DO  glucosamine-chondroitin 500-400 MG tablet Take 1 tablet by mouth 3 (three) times daily.    [provider]  hydrOXYzine (ATARAX) 10 MG tablet Take 1 tablet (10 mg total) by mouth 3 (three) times daily as needed for anxiety. 10/04/22   Parks Ranger, DO  JARDIANCE 10 MG TABS tablet Take 1 tablet (10 mg total) by mouth daily. 04/26/22   Alisa Graff, FNP  lactulose (CHRONULAC) 10 GM/15ML solution Take 45  mLs (30 g total) by mouth QID. 10/04/22   Parks Ranger, DO  losartan (COZAAR) 50 MG tablet Take 1 tablet (50 mg total) by mouth daily after breakfast. 10/05/22   Parks Ranger, DO  metFORMIN (GLUCOPHAGE-XR) 500 MG 24 hr tablet Take 1 tablet (500 mg total) by mouth daily. 04/26/22   Alisa Graff, FNP  Multiple Vitamin (MULTIVITAMIN WITH MINERALS) TABS tablet Take 1 tablet by mouth daily.    [provider]  nitroGLYCERIN (NITROSTAT) 0.4 MG SL tablet Place 1 tablet (0.4 mg total) under the tongue every 5 (five) minutes x 3 doses as needed for chest pain. 10/04/22   Parks Ranger, DO  pantoprazole (PROTONIX) 20 MG tablet Take 1 tablet (20 mg total) by mouth daily. 10/04/22   Parks Ranger, DO   QUEtiapine (SEROQUEL) 50 MG tablet Take 1 tablet (50 mg total) by mouth at bedtime. 10/04/22   Parks Ranger, DO     ALLERGIES:  Allergies  Allergen Reactions   Bee Venom Anaphylaxis   Codeine Itching    Only itching. Recently allergy tested at [facility]   Novocain [Procaine]    Sulfa Antibiotics Other (See Comments)     SOCIAL HISTORY:  Social History   Socioeconomic History   Marital status: Single    Spouse name: Not on file   Number of children: 2   Years of education: 12   Highest education level: 12th grade  Occupational History   Occupation: retired  Tobacco Use   Smoking status: Former   Smokeless tobacco: Never  Scientific laboratory technician Use: Not on file  Substance and Sexual Activity   Alcohol use: Never   Drug use: Never   Sexual activity: Never    Birth control/protection: None  Other Topics Concern   Not on file  Social History Narrative   Not on file   Social Determinants of Health   Financial Resource Strain: High Risk (04/27/2022)   Overall Financial Resource Strain (CARDIA)    Difficulty of Paying Living Expenses: Very hard  Food Insecurity: No Food Insecurity (09/24/2022)   Hunger Vital Sign    Worried About Running Out of Food in the Last Year: Never true    Ran Out of Food in the Last Year: Never true  Transportation Needs: No Transportation Needs (09/24/2022)   PRAPARE - Hydrologist (Medical): No    Lack of Transportation (Non-Medical): No  Physical Activity: Sufficiently Active (04/16/2018)   Exercise Vital Sign    Days of Exercise per Week: 7 days    Minutes of Exercise per Session: 30 min  Stress: Stress Concern Present (04/16/2018)   [place]    Feeling of Stress : Very much  Social Connections: Moderately Isolated (04/30/2018)   Social Connection and Isolation Panel [NHANES]    Frequency of Communication with Friends and Family: Twice a week     Frequency of Social Gatherings with Friends and Family: Never    Attends Religious Services: More than 4 times per year    Active Member of [employer] or Organizations: No    Attends Archivist Meetings: Never    Marital Status: Widowed  Intimate Partner Violence: Not At Risk (09/24/2022)   [name], Afraid, [name], and Kick questionnaire    Fear of Current or Ex-Partner: No    Emotionally Abused: No    Physically Abused: No    Sexually Abused: No  FAMILY HISTORY:  Family History  Problem Relation Age of Onset   Hypertension Mother    Heart disease Mother    Heart failure Maternal Grandmother      REVIEW OF SYSTEMS:  Constitutional: denies weight loss, fever, chills, or sweats  Eyes: denies any other vision changes, history of eye injury  ENT: denies sore throat, hearing problems  Respiratory: denies shortness of breath, wheezing  Cardiovascular: denies chest pain, palpitations  Gastrointestinal: Positive abdominal pain, nausea and vomiting Genitourinary: denies burning with urination or urinary frequency Musculoskeletal: denies any other joint pains or cramps  Skin: denies any other rashes or skin discolorations  Neurological: Positive for headaches, dizziness, weakness  Psychiatric: denies any other depression, anxiety   All other review of systems were negative   VITAL SIGNS:  Temp:  [98 F (36.7 C)-98.4 F (36.9 C)] 98.4 F (36.9 C) (03/11 1517) Pulse Rate:  [47-55] 55 (03/11 1517) Resp:  [17-18] 17 (03/11 1517) BP: (112-116)/(47-54) 114/50 (03/11 1517) SpO2:  [95 %-99 %] 98 % (03/11 1517) Weight:  [92 kg] 92 kg (03/11 1126)       Weight: 92 kg BMI (Calculated): 32.75   INTAKE/OUTPUT:  This shift: No intake/output data recorded.  Last 2 shifts: '@IOLAST2SHIFTS'$ @   PHYSICAL EXAM:  Constitutional:  -- Normal body habitus  -- Awake, alert, and oriented x3  Eyes:  -- Pupils equally round and reactive to light  -- No scleral icterus  Ear, nose, and  throat:  -- No jugular venous distension  Pulmonary:  -- No crackles  -- Equal breath sounds bilaterally -- Breathing non-labored at rest Cardiovascular:  -- S1, S2 present  -- No pericardial rubs Gastrointestinal:  -- Abdomen soft, tender to palpation in the right lower quadrant with guarding and rebound tenderness -- No abdominal masses appreciated, pulsatile or otherwise  Musculoskeletal and Integumentary:  -- Wounds: None appreciated -- Extremities: B/L UE and LE FROM, hands and feet warm, no edema  Neurologic:  -- Motor function: intact and symmetric -- Sensation: intact and symmetric   Labs:     Latest Ref Rng & Units 11/05/2022   11:27 AM 11/01/2022    4:18 AM 10/04/2022    6:58 AM  CBC  WBC 4.0 - 10.5 K/uL 2.7  3.9  2.3   Hemoglobin 13.0 - 17.0 g/dL 10.4  11.5  10.2   Hematocrit 39.0 - 52.0 % 32.0  35.0  30.6   Platelets 150 - 400 K/uL 98  99  76       Latest Ref Rng & Units 11/05/2022   11:27 AM 11/01/2022    4:18 AM 10/04/2022    6:58 AM  CMP  Glucose 70 - 99 mg/dL 89  79  77   BUN 8 - 23 mg/dL '14  13  19   '$ Creatinine 0.61 - 1.24 mg/dL 0.88  0.81  0.91   Sodium 135 - 145 mmol/L 138  141  138   Potassium 3.5 - 5.1 mmol/L 4.0  3.6  3.7   Chloride 98 - 111 mmol/L 110  111  111   CO2 22 - 32 mmol/L '23  21  24   '$ Calcium 8.9 - 10.3 mg/dL 9.0  8.5  9.0   Total Protein 6.5 - 8.1 g/dL 5.8  6.5  5.5   Total Bilirubin 0.3 - 1.2 mg/dL 2.0  2.0  1.1   Alkaline Phos 38 - 126 U/L 119  147  103   AST 15 - 41 U/L 38  62  43   ALT 0 - 44 U/L 23  33  29     Imaging studies:  EXAM: CT ABDOMEN AND PELVIS WITH CONTRAST   TECHNIQUE: Multidetector CT imaging of the abdomen and pelvis was performed using the standard protocol following bolus administration of intravenous contrast.   RADIATION DOSE REDUCTION: This exam was performed according to the departmental dose-optimization program which includes automated exposure control, adjustment of the mA and/or kV according  to patient size and/or use of iterative reconstruction technique.   CONTRAST:  135m OMNIPAQUE IOHEXOL 300 MG/ML  SOLN   COMPARISON:  08/31/2022 MR, 08/30/2022 CT and prior studies   FINDINGS: Lower chest: A trace RIGHT pleural effusion is present.   Hepatobiliary: Cirrhosis is again identified. Unchanged 2.5 cm posterior RIGHT hepatic mass (image 12: Series 4) does not appear significantly changed. Patient is status post cholecystectomy. Increasing CBD dilatation to the ampulla noted without obstructing cause identified on this study. The CBD now measures up to 3 cm in greatest diameter.   Pancreas: Unremarkable   Spleen: UPPER limits normal spleen size again noted.   Adrenals/Urinary Tract: The kidneys, adrenal glands and bladder are unremarkable except for unchanged nonobstructing RIGHT renal calculi.   Stomach/Bowel: The appendix is now enlarged with possible adjacent inflammation, suspicious for acute appendicitis. No other bowel abnormalities are noted. There is no evidence of bowel obstruction.   Vascular/Lymphatic: Recanalized umbilical vein noted. There is no evidence of abdominal aortic aneurysm. No abnormal lymph nodes are identified.   Reproductive: Prostate is unremarkable.   Other: Edema within the subcutaneous tissues and mesentery/abdominal fat noted. A trace amount of ascites noted within the pelvis. There is no evidence of focal collection or pneumoperitoneum.   Musculoskeletal: No acute or suspicious bony abnormalities are noted.   IMPRESSION: 1. Enlarged appendix with possible adjacent inflammation, suspicious for acute appendicitis. No evidence of focal collection, adjacent fluid or pneumoperitoneum. 2. Increasing CBD dilatation to the ampulla without obstructing cause identified on this study. Consider MRCP/ERCP. 3. Cirrhosis and portal hypertension with recanalized umbilical vein and trace amount of ascites. Unchanged 2.5 cm posterior  RIGHT hepatic mass suspicious for hepatocellular carcinoma as noted on prior abdominal MRI. 4. Trace RIGHT pleural effusion. 5. Nonobstructing RIGHT renal calculi.     Electronically Signed   By: JMargarette CanadaM.D.   On: 11/05/2022 16:58  Assessment/Plan:  72y.o. male with acute appendicitis, complicated by pertinent comorbidities including cirrhosis with portal hypertension, COPD, CHF, diabetes.  Patient with physical exam and imaging consistent with acute appendicitis.  Patient was seen and had a bowel diagnosis.  Patient was oriented about recommendation of appendectomy.  With his history of cirrhosis I discussed with patient that he is high risk of medical complications.  Patient is at least Child Pugh B.  Due to the diagnosis of acute appendicitis I think that treating patient medically will put him at risk of acute decompensation which will be too late to eventually proceed with appendectomy if he does not respond to medical therapy.  Currently with stable vital signs but with acute abdomen.  If you compare the last 2 CT scan he has a new finding of the enlarged appendix which was not seen on the previous CT scan and also slight ascites in the right upper quadrant.  Again if he continues to decompensate he will be too late to do any intervention on this patient.  I think that this is the only safe window to proceed with appendectomy understanding  that there would benefit await the risk at this moment.  I discussed with patient he is high risk of surgery including acute liver failure.  He understand that his liver is "very bad" and he understand he is higher mortality from any surgical procedure.  I also discussed with patient risk of bleeding from his portal hypertension, injury to adjacent organs such as intestine, bladder, ureter, liver, spleen, among others.  Also discussed with patient risk of leak from appendectomy area and other intra-abdominal infections.  Patient reported he understood all  these risks and agreed to proceed with surgery.  Will need to proceed in on emergent basis.  Appreciate hospitalist admission for management of complicated medical comorbidities.  Arnold Long, MD

## 2022-11-05 NOTE — Assessment & Plan Note (Signed)
Chronic and stable at this time.  Will continue to monitor while admitted.  - Differential pending - Continue daily CBC with differential

## 2022-11-05 NOTE — Assessment & Plan Note (Addendum)
Patient presenting with several week history of nausea and diarrhea that has acutely worsened with CT imaging consistent with possible acute appendicitis.  General surgery has evaluated and will plan for the OR emergently as patient is clinically stable at this time and further delay of surgery may lead to hemodynamic decompensation.    Given child Pugh class B, patient has a 30% perioperative mortality.  - General surgery following; appreciate their recommendations - Plan for appendectomy today - N.p.o. - S/p Zosyn

## 2022-11-05 NOTE — Anesthesia Preprocedure Evaluation (Signed)
Anesthesia Evaluation  Patient identified by MRN, date of birth, ID band Patient awake    Reviewed: Allergy & Precautions, NPO status , Patient's Chart, lab work & pertinent test results  History of Anesthesia Complications Negative for: history of anesthetic complications  Airway Mallampati: III  TM Distance: >3 FB Neck ROM: full    Dental  (+) Poor Dentition   Pulmonary sleep apnea , COPD,  COPD inhaler, former smoker   Pulmonary exam normal        Cardiovascular hypertension, On Medications +CHF  Normal cardiovascular exam  ECHO (7/23) IMPRESSIONS     1. Left ventricular ejection fraction, by estimation, is 60 to 65%. The  left ventricle has normal function. The left ventricle has no regional  wall motion abnormalities. Left ventricular diastolic parameters were  normal.   2. Right ventricular systolic function is normal. The right ventricular  size is normal.   3. The mitral valve is normal in structure. No evidence of mitral valve  regurgitation. No evidence of mitral stenosis.   4. The aortic valve is normal in structure. Aortic valve regurgitation is  not visualized. Aortic valve sclerosis is present, with no evidence of  aortic valve stenosis.   5. The inferior vena cava is normal in size with greater than 50%  respiratory variability, suggesting right atrial pressure of 3 mmHg.     Neuro/Psych  Headaches PSYCHIATRIC DISORDERS  Depression       GI/Hepatic ,GERD  ,,(+) Cirrhosis  (portal HTN)  ascites      Endo/Other  negative endocrine ROSdiabetes    Renal/GU      Musculoskeletal   Abdominal   Peds  Hematology  (+) Blood dyscrasia, anemia Thrombocytopenia     Anesthesia Other Findings Past Medical History: No date: Asthma No date: CHF (congestive heart failure) (HCC) No date: COPD (chronic obstructive pulmonary disease) ([place]) 03/2020: COVID-19     Comment:  diagnosed in August 2021 No date:  Diabetes mellitus without complication ([place]) No date: Homelessness No date: Hypertension No date: Infestation by bed bug No date: Migraine No date: Obesity No date: Sleep apnea  Past Surgical History: No date: CARDIAC CATHETERIZATION No date: CHOLECYSTECTOMY No date: EYE SURGERY No date: INNER EAR SURGERY No date: NOSE SURGERY  BMI    Body Mass Index: 32.74 kg/m      Reproductive/Obstetrics negative OB ROS                             Anesthesia Physical Anesthesia Plan  ASA: 3  Anesthesia Plan: General ETT   Post-op Pain Management: Toradol IV (intra-op)* and Dilaudid IV   Induction: Intravenous  PONV Risk Score and Plan: 2 and Ondansetron, Dexamethasone, Midazolam and Treatment may vary due to age or medical condition  Airway Management Planned: Oral ETT  Additional Equipment:   Intra-op Plan:   Post-operative Plan: Extubation in OR  Informed Consent: I have reviewed the patients History and Physical, chart, labs and discussed the procedure including the risks, benefits and alternatives for the proposed anesthesia with the patient or authorized representative who has indicated his/her understanding and acceptance.     Dental Advisory Given  Plan Discussed with: Anesthesiologist, CRNA and Surgeon  Anesthesia Plan Comments: (Patient consented for risks of anesthesia including but not limited to:  - adverse reactions to medications - damage to eyes, teeth, lips or other oral mucosa - nerve damage due to positioning  - sore throat or hoarseness -  Damage to heart, brain, nerves, lungs, other parts of body or loss of life  Patient voiced understanding.)        Anesthesia Quick Evaluation

## 2022-11-05 NOTE — ED Provider Notes (Signed)
72 yo M here with ongoing, persistent abd pain. Labs overall reassuring. No focal tenderness but given persistent pain, CT obtained at time of sign out. Plan to f/u CT and sx control.  CT scan shows acute appendicitis. Discussed with Dr. Maia Plan who requested hospitalist admission for medical management given his comorbidities. Hospitalist to admit.    Shaune Pollack, MD 11/06/22 [phone]

## 2022-11-05 NOTE — Assessment & Plan Note (Signed)
Chronic. -Platelet count slowly trending down, monitor closely. -Patient denies any bleeding. 

## 2022-11-05 NOTE — Assessment & Plan Note (Signed)
No wheezing on examination at this time.  - DuoNebs as needed every 6 hours

## 2022-11-05 NOTE — H&P (Addendum)
History and Physical    Patient: [name] [id number] DOB: 1951/03/21 DOA: 11/05/2022 DOS: the patient was seen and examined on 11/05/2022 PCP: Denton Lank, MD  Patient coming from: Homeless  Chief Complaint:  Chief Complaint  Patient presents with   Emesis   HPI: [name] is a 72 y.o. 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  Mr. Bala states that he has been experiencing daily nausea, vomiting and diarrhea that has been persistent for at least 2 to 3 weeks now.  He was seen in the ED for this several days ago and he states his symptoms are unchanged.  He feels that it is likely secondary to lactulose use.  He denies any fever, chills, shortness of breath, chest pain.  He states that vomiting and diarrhea are nonmelanotic, nonbloody.  He endorses lower extremity swelling that appears improved.  He denies any abdominal distention.  ED course: On arrival to the ED, patient was normotensive at 112/47 with heart rate of 47.  He was saturating at 95% on room air.  He was afebrile at 98.4.  Initial workup notable for WBC of 2.7, hemoglobin of 10.4, platelets of 98, bicarb 23, potassium 4.0, creatinine 0.88, albumin 2.2, AST 38, ALT 23, total bilirubin of 2.0 and GFR above 60.  CT of the abdomen was obtained that demonstrated enlarged appendix with possible adjacent inflammation concerning for acute appendicitis, increasing CBD dilation currently at 3 cm, cirrhosis, portal hypertension, and unchanged 2.5 cm right hepatic mass suspicious for HCC.  General surgery consulted with plans for appendectomy.  TR[facility] contacted for admission.  Review of Systems: As mentioned in the history of present illness. All other systems reviewed and are negative.  Past Medical History:  Diagnosis Date   Asthma    CHF (congestive  heart failure) (HCC)    COPD (chronic obstructive pulmonary disease) ([place])    COVID-19 03/2020   diagnosed in August 2021   Diabetes mellitus without complication ([place])    Homelessness    Hypertension    Infestation by bed bug    Migraine    Obesity    Sleep apnea    Past Surgical History:  Procedure Laterality Date   CARDIAC CATHETERIZATION     CHOLECYSTECTOMY     EYE SURGERY     INNER EAR SURGERY     NOSE SURGERY     Social History:  reports that he has quit smoking. He has never used smokeless tobacco. He reports that he does not drink alcohol and does not use drugs.  Allergies  Allergen Reactions   Bee Venom Anaphylaxis   Codeine Itching    Only itching. Recently allergy tested at [facility]   Novocain [Procaine]    Sulfa Antibiotics Other (See Comments)    Family History  Problem Relation Age of Onset   Hypertension Mother    Heart disease Mother    Heart failure Maternal Grandmother     Prior to Admission medications   Medication Sig Start Date End Date Taking? Authorizing Provider  albuterol (VENTOLIN HFA) 108 (90 Base) MCG/ACT inhaler Inhale 2 puffs into the lungs every 6 (six) hours as needed for wheezing or shortness of breath. 10/04/22 11/03/22  Parks Ranger, DO  ascorbic acid (VITAMIN C) 500 MG tablet Take 1 tablet (500 mg total) by mouth daily. 04/20/20   Sarajane Jews,  Melene Plan, MD  aspirin EC 81 MG tablet Take 1 tablet (81 mg total) by mouth daily. Swallow whole. 10/04/22   Parks Ranger, DO  atorvastatin (LIPITOR) 40 MG tablet Take 1 tablet (40 mg total) by mouth daily. 10/04/22   Parks Ranger, DO  citalopram (CELEXA) 20 MG tablet Take 1 tablet (20 mg total) by mouth daily. 10/04/22   Parks Ranger, DO  dicyclomine (BENTYL) 20 MG tablet Take 1 tablet (20 mg total) by mouth every 6 (six) hours. 11/01/22   Paulette Blanch, MD  doxepin (SINEQUAN) 50 MG capsule Take 1 capsule (50 mg total) by mouth at bedtime. 10/04/22   Parks Ranger,  DO  glucosamine-chondroitin 500-400 MG tablet Take 1 tablet by mouth 3 (three) times daily.    [provider]  hydrOXYzine (ATARAX) 10 MG tablet Take 1 tablet (10 mg total) by mouth 3 (three) times daily as needed for anxiety. 10/04/22   Parks Ranger, DO  JARDIANCE 10 MG TABS tablet Take 1 tablet (10 mg total) by mouth daily. 04/26/22   Alisa Graff, FNP  lactulose (CHRONULAC) 10 GM/15ML solution Take 45 mLs (30 g total) by mouth QID. 10/04/22   Parks Ranger, DO  losartan (COZAAR) 50 MG tablet Take 1 tablet (50 mg total) by mouth daily after breakfast. 10/05/22   Parks Ranger, DO  metFORMIN (GLUCOPHAGE-XR) 500 MG 24 hr tablet Take 1 tablet (500 mg total) by mouth daily. 04/26/22   Alisa Graff, FNP  Multiple Vitamin (MULTIVITAMIN WITH MINERALS) TABS tablet Take 1 tablet by mouth daily.    [provider]  nitroGLYCERIN (NITROSTAT) 0.4 MG SL tablet Place 1 tablet (0.4 mg total) under the tongue every 5 (five) minutes x 3 doses as needed for chest pain. 10/04/22   Parks Ranger, DO  pantoprazole (PROTONIX) 20 MG tablet Take 1 tablet (20 mg total) by mouth daily. 10/04/22   Parks Ranger, DO  QUEtiapine (SEROQUEL) 50 MG tablet Take 1 tablet (50 mg total) by mouth at bedtime. 10/04/22   Parks Ranger, DO    Physical Exam: Vitals:   11/05/22 1305 11/05/22 1517 11/05/22 1810 11/05/22 1854  BP: (!) 116/54 (!) 114/50 (!) 112/48 (!) 126/51  Pulse: (!) 48 (!) 55 (!) 54 (!) 52  Resp: '18 17 18 17  '$ Temp:  98.4 F (36.9 C) 98.2 F (36.8 C) 98.4 F (36.9 C)  TempSrc:   Oral   SpO2: 99% 98% 99% 100%  Weight:       Physical Exam Vitals and nursing note reviewed.  Constitutional:      General: He is not in acute distress.    Appearance: He is normal weight. He is not toxic-appearing.  HENT:     Head: Normocephalic and atraumatic.     Mouth/Throat:     Mouth: Mucous membranes are moist.     Pharynx: Oropharynx is clear.   Eyes:     Conjunctiva/sclera: Conjunctivae normal.     Pupils: Pupils are equal, round, and reactive to light.  Cardiovascular:     Rate and Rhythm: Normal rate and regular rhythm.     Heart sounds: No murmur heard.    No gallop.  Pulmonary:     Effort: Pulmonary effort is normal. No respiratory distress.     Breath sounds: Normal breath sounds. No wheezing, rhonchi or rales.  Abdominal:     General: Bowel sounds are normal. There is no distension.  Palpations: Abdomen is soft.     Tenderness: There is abdominal tenderness (Periumbilical). There is no guarding.  Musculoskeletal:        General: No tenderness or signs of injury.     Comments: +1 pitting edema bilaterally  Skin:    General: Skin is warm and dry.  Neurological:     General: No focal deficit present.     Mental Status: He is alert and oriented to person, place, and time. Mental status is at baseline.  Psychiatric:        Mood and Affect: Mood normal.        Behavior: Behavior normal.    Data Reviewed: CBC with WBC of 2.7, hemoglobin of 10.4, platelets of 98.  Differential pending CMP with sodium of 138, potassium 4.0, bicarb 23, BUN 14, creatinine 0.88, alkaline phosphatase 119, albumin 2.2, AST 38, ALT 23, total protein 5.8, total bilirubin 2.0 and GFR above 60 Urinalysis with no hematuria, ketonuria, leukocytes or bacteria. COVID-19, influenza and RSV PCR negative  CT ABDOMEN PELVIS W CONTRAST  Result Date: 11/05/2022 CLINICAL DATA:  72 year old male with acute abdominal and pelvic pain. History of cirrhosis. EXAM: CT ABDOMEN AND PELVIS WITH CONTRAST TECHNIQUE: Multidetector CT imaging of the abdomen and pelvis was performed using the standard protocol following bolus administration of intravenous contrast. RADIATION DOSE REDUCTION: This exam was performed according to the departmental dose-optimization program which includes automated exposure control, adjustment of the mA and/or kV according to patient size  and/or use of iterative reconstruction technique. CONTRAST:  135m OMNIPAQUE IOHEXOL 300 MG/ML  SOLN COMPARISON:  08/31/2022 MR, 08/30/2022 CT and prior studies FINDINGS: Lower chest: A trace RIGHT pleural effusion is present. Hepatobiliary: Cirrhosis is again identified. Unchanged 2.5 cm posterior RIGHT hepatic mass (image 12: Series 4) does not appear significantly changed. Patient is status post cholecystectomy. Increasing CBD dilatation to the ampulla noted without obstructing cause identified on this study. The CBD now measures up to 3 cm in greatest diameter. Pancreas: Unremarkable Spleen: UPPER limits normal spleen size again noted. Adrenals/Urinary Tract: The kidneys, adrenal glands and bladder are unremarkable except for unchanged nonobstructing RIGHT renal calculi. Stomach/Bowel: The appendix is now enlarged with possible adjacent inflammation, suspicious for acute appendicitis. No other bowel abnormalities are noted. There is no evidence of bowel obstruction. Vascular/Lymphatic: Recanalized umbilical vein noted. There is no evidence of abdominal aortic aneurysm. No abnormal lymph nodes are identified. Reproductive: Prostate is unremarkable. Other: Edema within the subcutaneous tissues and mesentery/abdominal fat noted. A trace amount of ascites noted within the pelvis. There is no evidence of focal collection or pneumoperitoneum. Musculoskeletal: No acute or suspicious bony abnormalities are noted. IMPRESSION: 1. Enlarged appendix with possible adjacent inflammation, suspicious for acute appendicitis. No evidence of focal collection, adjacent fluid or pneumoperitoneum. 2. Increasing CBD dilatation to the ampulla without obstructing cause identified on this study. Consider MRCP/ERCP. 3. Cirrhosis and portal hypertension with recanalized umbilical vein and trace amount of ascites. Unchanged 2.5 cm posterior RIGHT hepatic mass suspicious for hepatocellular carcinoma as noted on prior abdominal MRI. 4. Trace  RIGHT pleural effusion. 5. Nonobstructing RIGHT renal calculi. Electronically Signed   By: JMargarette CanadaM.D.   On: 11/05/2022 16:58    Results are pending, will review when available.  Assessment and Plan:  * Acute appendicitis Patient presenting with several week history of nausea and diarrhea that has acutely worsened with CT imaging consistent with possible acute appendicitis.  General surgery has evaluated and will plan for the OR emergently  as patient is clinically stable at this time and further delay of surgery may lead to hemodynamic decompensation.    Given child Pugh class B, patient has a 30% perioperative mortality.  - General surgery following; appreciate their recommendations - Plan for appendectomy today - N.p.o. - S/p Zosyn  Liver cirrhosis ([place]) Patient has a history of cirrhosis, likely secondary to hepatitis C with positive viral load in 2019.  Currently child Pugh class B with score of 9 points.  Acute viral panel approximately 1 month ago also demonstrated positive for hepatitis B antigen.  He has not established with GI at this time.  He is on lactulose for hepatic encephalopathy, that is likely the reason for his diarrhea.  He is currently on 30 g 4 times daily.  Given patient's limited resources and on home status, will consult GI while hospitalized.  - Discussed with GI, no indication for inpatient consultation given no acute issues at this time - PT/INR pending - Decrease dose to 20 g 3 times daily with goal of 2-3 bowel movements per day - Repeat hepatitis B and C lab work  Pancytopenia ([place]) Chronic and stable at this time.  Will continue to monitor while admitted.  - Differential pending - Continue daily CBC with differential  COPD (chronic obstructive pulmonary disease) (HCC) No wheezing on examination at this time.  - DuoNebs as needed every 6 hours  HTN (hypertension) - Continue home antihypertensives  Chronic diastolic CHF (congestive heart  failure) ([place]) Patient appears essentially euvolemic at this time with only minimal lower extremity edema but no pulmonary edema.  - Monitor volume status while admitted  Diabetes mellitus without complication ([place]) - Hold home antiglycemic agents - SSI, moderate  Sinus bradycardia Chronic and stable at this time.  Unsheltered homelessness - TOC consultation placed  Advance Care Planning:   Code Status: Full Code verified by patient  Consults: General Surgery  Family Communication: No family at bedside  Severity of Illness: The appropriate patient status for this patient is INPATIENT. Inpatient status is judged to be reasonable and necessary in order to provide the required intensity of service to ensure the patient's safety. The patient's presenting symptoms, physical exam findings, and initial radiographic and laboratory data in the context of their chronic comorbidities is felt to place them at high risk for further clinical deterioration. Furthermore, it is not anticipated that the patient will be medically stable for discharge from the hospital within 2 midnights of admission.   * I certify that at the point of admission it is my clinical judgment that the patient will require inpatient hospital care spanning beyond 2 midnights from the point of admission due to high intensity of service, high risk for further deterioration and high frequency of surveillance required.*  Author: Jose Persia, MD 11/05/2022 7:50 PM  For on call review [email or web address].

## 2022-11-05 NOTE — Assessment & Plan Note (Addendum)
Patient has a history of cirrhosis, likely secondary to hepatitis C with positive viral load in 2019.  Currently child Pugh class B with score of 9 points.  Acute viral panel approximately 1 month ago also demonstrated positive for hepatitis B antigen.  He has not established with GI at this time.  He is on lactulose for hepatic encephalopathy, that is likely the reason for his diarrhea.  He is currently on 30 g 4 times daily.  Given patient's limited resources and on home status, will consult GI while hospitalized.  - Discussed with GI, no indication for inpatient consultation given no acute issues at this time - PT/INR pending - Decrease dose to 20 g 3 times daily with goal of 2-3 bowel movements per day - Repeat hepatitis B and C lab work

## 2022-11-05 NOTE — Assessment & Plan Note (Signed)
-   Hold home antiglycemic agents - SSI, moderate 

## 2022-11-05 NOTE — Assessment & Plan Note (Signed)
-   TOC consultation placed

## 2022-11-06 ENCOUNTER — Other Ambulatory Visit: Payer: Self-pay

## 2022-11-06 ENCOUNTER — Encounter: Payer: Self-pay | Admitting: Internal Medicine

## 2022-11-06 ENCOUNTER — Inpatient Hospital Stay: Payer: 59

## 2022-11-06 LAB — TYPE AND SCREEN
ABO/RH(D): AB NEG
Antibody Screen: NEGATIVE
Unit division: 0
Unit division: 0

## 2022-11-06 LAB — COMPREHENSIVE METABOLIC PANEL
ALT: 23 U/L (ref 0–44)
AST: 37 U/L (ref 15–41)
Albumin: 2.2 g/dL — ABNORMAL LOW (ref 3.5–5.0)
Alkaline Phosphatase: 124 U/L (ref 38–126)
Anion gap: 9 (ref 5–15)
BUN: 15 mg/dL (ref 8–23)
CO2: 21 mmol/L — ABNORMAL LOW (ref 22–32)
Calcium: 8.7 mg/dL — ABNORMAL LOW (ref 8.9–10.3)
Chloride: 106 mmol/L (ref 98–111)
Creatinine, Ser: 1.02 mg/dL (ref 0.61–1.24)
GFR, Estimated: >60 mL/min (ref >60)
Glucose, Bld: 163 mg/dL — ABNORMAL HIGH (ref 70–99)
Potassium: 4 mmol/L (ref 3.5–5.1)
Sodium: 136 mmol/L (ref 135–145)
Total Bilirubin: 1.8 mg/dL — ABNORMAL HIGH (ref 0.3–1.2)
Total Protein: 6 g/dL — ABNORMAL LOW (ref 6.5–8.1)

## 2022-11-06 LAB — CBC
HCT: 32.9 % — ABNORMAL LOW (ref 39.0–52.0)
Hemoglobin: 10.8 g/dL — ABNORMAL LOW (ref 13.0–17.0)
MCH: 32.6 pg (ref 26.0–34.0)
MCHC: 32.8 g/dL (ref 30.0–36.0)
MCV: 99.4 fL (ref 80.0–100.0)
Platelets: 82 10*3/uL — ABNORMAL LOW (ref 150–400)
RBC: 3.31 MIL/uL — ABNORMAL LOW (ref 4.22–5.81)
RDW: 15.7 % — ABNORMAL HIGH (ref 11.5–15.5)
WBC: 3 10*3/uL — ABNORMAL LOW (ref 4.0–10.5)
nRBC: 0 % (ref 0.0–0.2)

## 2022-11-06 LAB — PREPARE RBC (CROSSMATCH)

## 2022-11-06 LAB — BPAM RBC
Blood Product Expiration Date: 202403172359
Blood Product Expiration Date: 202403192359
Unit Type and Rh: 600
Unit Type and Rh: 600

## 2022-11-06 LAB — GLUCOSE, CAPILLARY
Glucose-Capillary: 140 mg/dL — ABNORMAL HIGH (ref 70–99)
Glucose-Capillary: 142 mg/dL — ABNORMAL HIGH (ref 70–99)
Glucose-Capillary: 200 mg/dL — ABNORMAL HIGH (ref 70–99)
Glucose-Capillary: 279 mg/dL — ABNORMAL HIGH (ref 70–99)
Glucose-Capillary: 91 mg/dL (ref 70–99)

## 2022-11-06 LAB — PROTIME-INR
INR: 1.3 — ABNORMAL HIGH (ref 0.8–1.2)
Prothrombin Time: 16.4 seconds — ABNORMAL HIGH (ref 11.4–15.2)

## 2022-11-06 LAB — HEPATITIS B CORE ANTIBODY, TOTAL: Hep B Core Total Ab: REACTIVE — AB

## 2022-11-06 MED ORDER — PIPERACILLIN-TAZOBACTAM 3.375 G IVPB 30 MIN
3.3750 g | Freq: Three times a day (TID) | INTRAVENOUS | Status: DC
  Administered 2022-11-06 – 2022-11-08 (×6): 3.375 g via INTRAVENOUS
  Filled 2022-11-06 (×14): qty 50

## 2022-11-06 MED ORDER — FENTANYL CITRATE (PF) 100 MCG/2ML IJ SOLN
INTRAMUSCULAR | Status: AC
  Filled 2022-11-06: qty 2

## 2022-11-06 MED ORDER — ENSURE MAX PROTEIN PO LIQD
11.0000 [oz_av] | Freq: Two times a day (BID) | ORAL | Status: DC
  Administered 2022-11-06 – 2022-11-08 (×4): 11 [oz_av] via ORAL

## 2022-11-06 MED ORDER — ADULT MULTIVITAMIN W/MINERALS CH
1.0000 | ORAL_TABLET | Freq: Every day | ORAL | Status: DC
  Administered 2022-11-07 – 2022-11-08 (×2): 1 via ORAL
  Filled 2022-11-06 (×2): qty 1

## 2022-11-06 MED ORDER — OXYCODONE HCL 5 MG PO TABS
5.0000 mg | ORAL_TABLET | ORAL | Status: DC | PRN
  Administered 2022-11-06 – 2022-11-08 (×6): 10 mg via ORAL
  Filled 2022-11-06 (×6): qty 2

## 2022-11-06 MED ORDER — OXYCODONE HCL 5 MG/5ML PO SOLN: 5.0000 mg | Freq: Once | ORAL | Status: AC | PRN

## 2022-11-06 MED ORDER — OXYCODONE HCL 5 MG PO TABS
ORAL_TABLET | ORAL | Status: AC
  Filled 2022-11-06: qty 1

## 2022-11-06 MED ORDER — INFLUENZA VAC A&B SA ADJ QUAD 0.5 ML IM PRSY
0.5000 mL | PREFILLED_SYRINGE | INTRAMUSCULAR | Status: AC
  Administered 2022-11-07: 0.5 mL via INTRAMUSCULAR
  Filled 2022-11-06: qty 0.5

## 2022-11-06 MED ORDER — FENTANYL CITRATE (PF) 100 MCG/2ML IJ SOLN
25.0000 ug | INTRAMUSCULAR | Status: DC | PRN
  Administered 2022-11-06 (×4): 25 ug via INTRAVENOUS

## 2022-11-06 MED ORDER — SUGAMMADEX SODIUM 200 MG/2ML IV SOLN
INTRAVENOUS | Status: DC | PRN
  Administered 2022-11-06: 184 mg via INTRAVENOUS

## 2022-11-06 MED ORDER — LACTULOSE 10 GM/15ML PO SOLN
20.0000 g | Freq: Two times a day (BID) | ORAL | Status: DC
  Administered 2022-11-06 – 2022-11-08 (×5): 20 g via ORAL
  Filled 2022-11-06 (×5): qty 30

## 2022-11-06 MED ORDER — OXYCODONE HCL 5 MG PO TABS
5.0000 mg | ORAL_TABLET | Freq: Once | ORAL | Status: AC | PRN
  Administered 2022-11-06: 5 mg via ORAL

## 2022-11-06 NOTE — Progress Notes (Signed)
Hypoglycemic Event  CBG: 55  Treatment: D50 25 mL (12.5 gm)  Symptoms: None  Follow-up CBG: Time: 2202 CBG Result:89  Possible Reasons for Event: Other: Pt NPO for upcoming procedure.  Comments: Hypoglycemic Protocol initiated, Charge RN made aware.    [name], [name]

## 2022-11-06 NOTE — Progress Notes (Signed)
Initial Nutrition Assessment  DOCUMENTATION CODES:   Obesity unspecified  INTERVENTION:   Ensure Max protein supplement BID, each supplement provides 150kcal and 30g of protein.  MVI po daily   Pt at high refeed risk; recommend monitor potassium, magnesium and phosphorus labs daily until stable  Daily weights   High protein/low sodium diet education  NUTRITION DIAGNOSIS:   Increased nutrient needs related to post-op healing as evidenced by estimated needs.  GOAL:   Patient will meet greater than or equal to 90% of their needs  MONITOR:   PO intake, Supplement acceptance, Labs, Weight trends, I & O's, Skin  REASON FOR ASSESSMENT:   Consult Assessment of nutrition requirement/status  ASSESSMENT:   72 y/o male with h/o HTN, COPD, CHF, hepatitis C, TIA, cirrhosis with portal hypertension, DM, MDD, pancytopenia and homelessness who is admitted with  acute appendicitis s/p robotic appendectomy 3/12.  Met with pt in room today. Pt reports good appetite and oral intake at baseline but reports decreased oral intake for 2-3 weeks pta r/t intermittent nausea. Per chart review, pt appears to have had numerous office visits and admissions for abdominal pain and nausea since September. Pt reports that he has had significant weight loss but is unsure of how much. It is difficult to determine in chart if patient has had any significant recent weight loss as pt's weights have varied from 206-276lbs over the past year. Pt is noted to have edema in his BLE. Pt reports that today, he is eating 100% of meals. RD discussed with pt the importance of adequate nutrition needed to preserve lean muscle and to support post op healing. RD will add supplements to help pt meet his estimated needs (prefers chocolate). Pt is at high refeed risk. RD provided patient with high protein, low sodium diet education today.     Medications reviewed and include: celexa, insulin, lactulose, MVI, protonix, zosyn    Labs reviewed: K 4.0 wnl Mg 1.6(L)- 3/11 Wbc- 3.0(L), Hgb 10.8(L), Hct 32.9(L) Cbgs- 200, 279, 91 x 24 hrs  AIC 4.8- 1/28  NUTRITION - FOCUSED PHYSICAL EXAM:  Flowsheet Row Most Recent Value  Orbital Region No depletion  Upper Arm Region Mild depletion  Thoracic and Lumbar Region No depletion  Buccal Region No depletion  Temple Region No depletion  Clavicle Bone Region Mild depletion  Clavicle and Acromion Bone Region Mild depletion  Scapular Bone Region No depletion  Dorsal Hand Mild depletion  Patellar Region No depletion  Anterior Thigh Region No depletion  Posterior Calf Region Unable to assess  Edema (RD Assessment) Mild  Hair Reviewed  Eyes Reviewed  Mouth Reviewed  Skin Reviewed  Nails Reviewed   Diet Order:   Diet Order             DIET SOFT Fluid consistency: Thin  Diet effective now                  EDUCATION NEEDS:   Education needs have been addressed  Skin:  Skin Assessment: Reviewed RN Assessment  Last BM:  3/12  Height:   Ht Readings from Last 1 Encounters:  10/17/22 '5\' 6"'$  (1.676 m)    Weight:   Wt Readings from Last 1 Encounters:  11/05/22 92 kg    Ideal Body Weight:  64.5 kg  BMI:  Body mass index is 32.74 kg/m.  Estimated Nutritional Needs:   Kcal:  1900-2200kcal/day  Protein:  95-110g/day  Fluid:  1.7-1.9L/day  Koleen Distance MS, RD, LDN Please refer to  [facility] for RD and/or RD on-call/weekend/after hours pager

## 2022-11-06 NOTE — Op Note (Signed)
Pre-op Diagnosis: Acute appendicitis   Post op Diagnosis: Acute appenditicis  Procedure: Robotic assisted laparoscopic appendectomy.  Anesthesia: GETA  Surgeon: Herbert Pun, MD, FACS  Wound Classification: clean contaminated  Specimen: Appendix  Complications: None  Estimated Blood Loss: 3 mL   Indications: Patient is a 72 y.o. male  presented with above right lower quadrant pain. CT scan shows acute appendicitis.     FIndings: 1.  Irritated appendix 2. No peri-appendiceal abscess or phlegmon 3.  Clear ascites throughout the abdomen 4.  Cirrhotic liver 5. Adequate hemostasis.   Description of procedure: The patient was placed on the operating table in the supine position. General anesthesia was induced. A time-out was completed verifying correct patient, procedure, site, positioning, and implant(s) and/or special equipment prior to beginning this procedure. The abdomen was prepped and draped in the usual sterile fashion.   Palmer's point located and Veress needle was inserted.  After confirming 2 clicks and a positive saline drop test, gas insufflation was initiated until the abdominal pressure was measured at 15 mmHg.  Afterwards, the Veress needle was removed and a 8 mm port was placed in left upper quadrant area using Optiview technique.  After local was infused, 3 additional incision on the left abdominal wall were made 5 cm apart.  An 12 mm port and two other 8 mm ports were placed under direct visualization.  No injuries from trocar placements were noted.  The table was placed in the Trendelenburg position with the right side elevated.  With the use of Tip up grasper, fenestrated bipolar and monopolar scissors, an inflamed appendix was identified and elevated.  Window created at base of appendix in the mesentery.    The mesoappendix was divided with combination of bipolar energy and monopolar scissors.  The base of the appendix was ligated with 3-0 V-Loc.  The appendix  was divided with monopolar scissors.  A second layer of the 3 oh V-Loc was done over the appendiceal stump.  The appendiceal stump was examined and hemostasis noted. No other pathology was identified within pelvis. The 12 mm trocar removed and port site closed with PMI using 0 vicryl under direct vision. Remaining trocars were removed under direct vision. No bleeding was noted.The abdomen was allowed to collapse.  All skin incisions then closed with subcuticular sutures Monocryl 4-0.  Wounds then dressed with dermabond.  The patient tolerated the procedure well, awakened from anesthesia and was taken to the postanesthesia care unit in satisfactory condition.  Sponge count and instrument count correct at the end of the procedure.

## 2022-11-06 NOTE — Progress Notes (Signed)
IV BLEW. NEW IV STARTED TO LAC #20. X 2 ATTEMPTS. PATIENT TOLERATED WELL.  [name] [name]

## 2022-11-06 NOTE — Progress Notes (Signed)
PROGRESS NOTE    [name]  [id number] DOB: [birth year] DOA: 11/05/2022 PCP: Denton Lank, MD    Brief Narrative:   72 y.o. 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   [name] states that he has been experiencing daily nausea, vomiting and diarrhea that has been persistent for at least 2 to 3 weeks now.  He was seen in the ED for this several days ago and he states his symptoms are unchanged.  He feels that it is likely secondary to lactulose use.  He denies any fever, chills, shortness of breath, chest pain.  He states that vomiting and diarrhea are nonmelanotic, nonbloody.  He endorses lower extremity swelling that appears improved.  He denies any abdominal distention.  General surgery consulted from admission.  Took patient emergently to operating room early a.m. on 3/12.  Patient tolerated procedure well.  Placed on soft diet.  Tolerating well.  Per surgery recommendations recommend additional 24 hours of inpatient monitoring prior to discharge planning.   Assessment & Plan:   Principal Problem:   Acute appendicitis Active Problems:   Liver cirrhosis (HCC)   Pancytopenia (HCC)   COPD (chronic obstructive pulmonary disease) (HCC)   HTN (hypertension)   Chronic diastolic CHF (congestive heart failure) (HCC)   Diabetes mellitus without complication ([place])   Unsheltered homelessness   Sinus bradycardia   Acute appendicitis Patient presenting with several week history of nausea and diarrhea that has acutely worsened with CT imaging consistent with possible acute appendicitis.  General surgery has evaluated and will plan for the OR emergently.  Patient now status post laparoscopic appendectomy Plan: Given medical complexity and high risk for decompensation will place back on intravenous Zosyn.  Can likely  discontinue this antibiotic within 24 hours.  Okay for soft diet.  Monitor closely.  Anticipate DC 3/13.  Dilated CBD Seen on CT abdomen pelvis.  No obvious signs of obstructive stones.  No clinical indication of cholangitis.  Patient is status post laparoscopic cholecystectomy years ago.  LFTs normal.  Low/no suspicion for ascending cholangitis.  Patient will be on empiric Zosyn for today.  Will check right upper quadrant ultrasound.  If ERCP is indicated patient may need to be transferred however clinical suspicion of obstructing CBD stone is low   Liver cirrhosis ([place]) Patient has a history of cirrhosis, likely secondary to hepatitis C with positive viral load in 2019.  Currently child Pugh class B with score of 9 points.  Acute viral panel approximately 1 month ago also demonstrated positive for hepatitis B antigen.  He has not established with GI at this time.  He is on lactulose for hepatic encephalopathy, that is likely the reason for his diarrhea.  He is currently on 30 g 4 times daily.  Plan: Decrease lactulose dose substantially as patient is been having 10+ episodes of diarrhea daily.  He has been taking Imodium in combination with his lactulose.  Will decrease dose of lactulose to 20 g twice daily.  Titrate dose in effort to achieve 3-4 soft bowel movements daily.  GI stated no indication for inpatient consultation.  Pancytopenia ([place]) Chronic and stable at this time.  Will continue to monitor while admitted.  Likely secondary to hepatic cirrhosis  COPD (chronic obstructive pulmonary disease) (HCC) No wheezing on examination at this time. As needed bronchodilator therapy   HTN (hypertension)  Continue home antihypertensives   Chronic diastolic CHF (congestive heart failure) ([place]) Patient appears essentially euvolemic at this time with only minimal lower extremity edema but no pulmonary edema.  Monitor volume status while admitted   Diabetes mellitus without complication ([place]) -  Hold home antiglycemic agents - SSI, moderate   Sinus bradycardia Chronic and stable at this time.   Unsheltered homelessness - TOC consultation placed   DVT prophylaxis: SCDs Code Status: Full Family Communication: None Disposition Plan: Status is: Inpatient Remains inpatient appropriate because: Acute appendicitis, medical complexity.  Anticipate discharge 3/13.   Level of care: Med-Surg  Consultants:  General surgery  Procedures:  Appendectomy 3/12  Antimicrobials: Zosyn   Subjective: Seen and examined.  Resting comfortably in bed.  No visible distress.  Pain well-controlled.  Objective: Vitals:   11/06/22 0145 11/06/22 0217 11/06/22 0320 11/06/22 0859  BP: (!) 109/56 128/62 118/64 (!) 112/49  Pulse: 62 (!) 56 66 66  Resp:  16    Temp: 97.8 F (36.6 C) 98.5 F (36.9 C) 98.1 F (36.7 C) 97.8 F (36.6 C)  TempSrc:  Oral  Oral  SpO2: 99% 97% 98% 98%  Weight:        Intake/Output Summary (Last 24 hours) at 11/06/2022 1134 Last data filed at 11/06/2022 1050 Gross per 24 hour  Intake 880 ml  Output 10 ml  Net 870 ml   Filed Weights   11/05/22 1126  Weight: 92 kg    Examination:  General exam: Appears calm and comfortable  Respiratory system: Clear to auscultation. Respiratory effort normal. Cardiovascular system: S1 & S2 heard, RRR. No JVD, murmurs, rubs, gallops or clicks. No pedal edema. Gastrointestinal system: Abdomen is nondistended, soft and nontender. No organomegaly or masses felt. Normal bowel sounds heard. Central nervous system: Alert and oriented. No focal neurological deficits. Extremities: Symmetric 5 x 5 power. Skin: No rashes, lesions or ulcers Psychiatry: Judgement and insight appear normal. Mood & affect appropriate.     Data Reviewed: I have personally reviewed following labs and imaging studies  CBC: Recent Labs  Lab 11/01/22 0418 11/05/22 1127 11/05/22 1911 11/06/22 0455  WBC 3.9* 2.7* 2.5* 3.0*  NEUTROABS 2.2  --   1.1*  --   HGB 11.5* 10.4* 10.3* 10.8*  HCT 35.0* 32.0* 31.0* 32.9*  MCV 97.5 99.4 98.4 99.4  PLT 99* 98* 100* 82*   Basic Metabolic Panel: Recent Labs  Lab 11/01/22 0418 11/05/22 1127 11/05/22 1915 11/06/22 0455  NA 141 138  --  136  K 3.6 4.0  --  4.0  CL 111 110  --  106  CO2 21* 23  --  21*  GLUCOSE 79 89  --  163*  BUN 13 14  --  15  CREATININE 0.81 0.88  --  1.02  CALCIUM 8.5* 9.0  --  8.7*  MG 1.5*  --  1.6*  --    GFR: Estimated Creatinine Clearance: 68.5 mL/min (by C-G formula based on SCr of 1.02 mg/dL). Liver Function Tests: Recent Labs  Lab 11/01/22 0418 11/05/22 1127 11/06/22 0455  AST 62* 38 37  ALT 33 23 23  ALKPHOS 147* 119 124  BILITOT 2.0* 2.0* 1.8*  PROT 6.5 5.8* 6.0*  ALBUMIN 2.5* 2.2* 2.2*   Recent Labs  Lab 11/01/22 0418 11/05/22 1127  LIPASE 38 34   Recent Labs  Lab 11/01/22 0418  AMMONIA 43*   Coagulation Profile: Recent Labs  Lab 11/05/22 1915 11/06/22 0455  INR 1.3* 1.3*   Cardiac  Enzymes: No results for input(s): "CKTOTAL", "CKMB", "CKMBINDEX", "TROPONINI" in the last 168 hours. BNP (last 3 results) No results for input(s): "PROBNP" in the last 8760 hours. HbA1C: No results for input(s): "HGBA1C" in the last 72 hours. CBG: Recent Labs  Lab 11/05/22 2127 11/05/22 2202 11/06/22 0024 11/06/22 0811  GLUCAP 55* 89 91 279*   Lipid Profile: No results for input(s): "CHOL", "HDL", "LDLCALC", "TRIG", "CHOLHDL", "LDLDIRECT" in the last 72 hours. Thyroid Function Tests: No results for input(s): "TSH", "T4TOTAL", "FREET4", "T3FREE", "THYROIDAB" in the last 72 hours. Anemia Panel: No results for input(s): "VITAMINB12", "FOLATE", "FERRITIN", "TIBC", "IRON", "RETICCTPCT" in the last 72 hours. Sepsis Labs: No results for input(s): "PROCALCITON", "LATICACIDVEN" in the last 168 hours.  Recent Results (from the past 240 hour(s))  C Difficile Quick Screen w PCR reflex     Status: None   Collection Time: 11/01/22  4:18 AM    Specimen: STOOL  Result Value Ref Range Status   C Diff antigen NEGATIVE NEGATIVE Final   C Diff toxin NEGATIVE NEGATIVE Final   C Diff interpretation No C. difficile detected.  Final    Comment: Performed at [facility], [address] [place], [place] [address]  Gastrointestinal Panel by PCR , Stool     Status: None   Collection Time: 11/01/22  4:18 AM   Specimen: STOOL  Result Value Ref Range Status   Campylobacter species NOT DETECTED NOT DETECTED Final   Plesimonas shigelloides NOT DETECTED NOT DETECTED Final   Salmonella species NOT DETECTED NOT DETECTED Final   Yersinia enterocolitica NOT DETECTED NOT DETECTED Final   Vibrio species NOT DETECTED NOT DETECTED Final   Vibrio cholerae NOT DETECTED NOT DETECTED Final   Enteroaggregative E coli (EAEC) NOT DETECTED NOT DETECTED Final   Enteropathogenic E coli (EPEC) NOT DETECTED NOT DETECTED Final   Enterotoxigenic E coli (ETEC) NOT DETECTED NOT DETECTED Final   Shiga like toxin producing E coli (STEC) NOT DETECTED NOT DETECTED Final   Shigella/Enteroinvasive E coli (EIEC) NOT DETECTED NOT DETECTED Final   Cryptosporidium NOT DETECTED NOT DETECTED Final   Cyclospora cayetanensis NOT DETECTED NOT DETECTED Final   Entamoeba histolytica NOT DETECTED NOT DETECTED Final   Giardia lamblia NOT DETECTED NOT DETECTED Final   Adenovirus F40/41 NOT DETECTED NOT DETECTED Final   Astrovirus NOT DETECTED NOT DETECTED Final   Norovirus GI/GII NOT DETECTED NOT DETECTED Final   Rotavirus A NOT DETECTED NOT DETECTED Final   Sapovirus (I, II, IV, and V) NOT DETECTED NOT DETECTED Final    Comment: Performed at [facility], [address] [place], [place] [address]  Resp panel by RT-PCR (RSV, Flu A&B, Covid) Anterior Nasal Swab     Status: None   Collection Time: 11/05/22  3:25 PM   Specimen: Anterior Nasal Swab  Result Value Ref Range Status   SARS Coronavirus 2 by RT PCR NEGATIVE NEGATIVE Final    Comment: (NOTE) SARS-CoV-2  target nucleic acids are NOT DETECTED.  The SARS-CoV-2 RNA is generally detectable in upper respiratory specimens during the acute phase of infection. The lowest concentration of SARS-CoV-2 viral copies this assay can detect is 138 copies/mL. A negative result does not preclude SARS-Cov-2 infection and should not be used as the sole basis for treatment or other patient management decisions. A negative result may occur with  improper specimen collection/handling, submission of specimen other than nasopharyngeal swab, presence of viral mutation(s) within the areas targeted by this assay, and inadequate number of viral  copies(<138 copies/mL). A negative result must be combined with clinical observations, patient history, and epidemiological information. The expected result is Negative.  Fact Sheet for Patients:  EntrepreneurPulse.com.au  Fact Sheet for Healthcare Providers:  IncredibleEmployment.be  This test is no t yet approved or cleared by the [place] FDA and  has been authorized for detection and/or diagnosis of SARS-CoV-2 by FDA under an Emergency Use Authorization (EUA). This EUA will remain  in effect (meaning this test can be used) for the duration of the COVID-19 declaration under Section 564(b)(1) of the Act, 21 U.S.C.section 360bbb-3(b)(1), unless the authorization is terminated  or revoked sooner.       Influenza A by PCR NEGATIVE NEGATIVE Final   Influenza B by PCR NEGATIVE NEGATIVE Final    Comment: (NOTE) The Xpert Xpress SARS-CoV-2/FLU/RSV plus assay is intended as an aid in the diagnosis of influenza from Nasopharyngeal swab specimens and should not be used as a sole basis for treatment. Nasal washings and aspirates are unacceptable for Xpert Xpress SARS-CoV-2/FLU/RSV testing.  Fact Sheet for Patients: EntrepreneurPulse.com.au  Fact Sheet for Healthcare  Providers: IncredibleEmployment.be  This test is not yet approved or cleared by the [place] FDA and has been authorized for detection and/or diagnosis of SARS-CoV-2 by FDA under an Emergency Use Authorization (EUA). This EUA will remain in effect (meaning this test can be used) for the duration of the COVID-19 declaration under Section 564(b)(1) of the Act, 21 U.S.C. section 360bbb-3(b)(1), unless the authorization is terminated or revoked.     Resp Syncytial Virus by PCR NEGATIVE NEGATIVE Final    Comment: (NOTE) Fact Sheet for Patients: EntrepreneurPulse.com.au  Fact Sheet for Healthcare Providers: IncredibleEmployment.be  This test is not yet approved or cleared by the [place] FDA and has been authorized for detection and/or diagnosis of SARS-CoV-2 by FDA under an Emergency Use Authorization (EUA). This EUA will remain in effect (meaning this test can be used) for the duration of the COVID-19 declaration under Section 564(b)(1) of the Act, 21 U.S.C. section 360bbb-3(b)(1), unless the authorization is terminated or revoked.  Performed at [facility], [address] [place], [place] [address]          Radiology Studies: CT ABDOMEN PELVIS W CONTRAST  Result Date: 11/05/2022 CLINICAL DATA:  72 year old male with acute abdominal and pelvic pain. History of cirrhosis. EXAM: CT ABDOMEN AND PELVIS WITH CONTRAST TECHNIQUE: Multidetector CT imaging of the abdomen and pelvis was performed using the standard protocol following bolus administration of intravenous contrast. RADIATION DOSE REDUCTION: This exam was performed according to the departmental dose-optimization program which includes automated exposure control, adjustment of the mA and/or kV according to patient size and/or use of iterative reconstruction technique. CONTRAST:  163m OMNIPAQUE IOHEXOL 300 MG/ML  SOLN COMPARISON:  08/31/2022 MR,  08/30/2022 CT and prior studies FINDINGS: Lower chest: A trace RIGHT pleural effusion is present. Hepatobiliary: Cirrhosis is again identified. Unchanged 2.5 cm posterior RIGHT hepatic mass (image 12: Series 4) does not appear significantly changed. Patient is status post cholecystectomy. Increasing CBD dilatation to the ampulla noted without obstructing cause identified on this study. The CBD now measures up to 3 cm in greatest diameter. Pancreas: Unremarkable Spleen: UPPER limits normal spleen size again noted. Adrenals/Urinary Tract: The kidneys, adrenal glands and bladder are unremarkable except for unchanged nonobstructing RIGHT renal calculi. Stomach/Bowel: The appendix is now enlarged with possible adjacent inflammation, suspicious for acute appendicitis. No other bowel abnormalities are noted. There is no evidence of bowel obstruction. Vascular/Lymphatic:  Recanalized umbilical vein noted. There is no evidence of abdominal aortic aneurysm. No abnormal lymph nodes are identified. Reproductive: Prostate is unremarkable. Other: Edema within the subcutaneous tissues and mesentery/abdominal fat noted. A trace amount of ascites noted within the pelvis. There is no evidence of focal collection or pneumoperitoneum. Musculoskeletal: No acute or suspicious bony abnormalities are noted. IMPRESSION: 1. Enlarged appendix with possible adjacent inflammation, suspicious for acute appendicitis. No evidence of focal collection, adjacent fluid or pneumoperitoneum. 2. Increasing CBD dilatation to the ampulla without obstructing cause identified on this study. Consider MRCP/ERCP. 3. Cirrhosis and portal hypertension with recanalized umbilical vein and trace amount of ascites. Unchanged 2.5 cm posterior RIGHT hepatic mass suspicious for hepatocellular carcinoma as noted on prior abdominal MRI. 4. Trace RIGHT pleural effusion. 5. Nonobstructing RIGHT renal calculi. Electronically Signed   By: Margarette Canada M.D.   On: 11/05/2022  16:58        Scheduled Meds:  citalopram  20 mg Oral Daily   doxepin  50 mg Oral QHS   [START ON 11/07/2022] influenza vaccine adjuvanted  0.5 mL Intramuscular Tomorrow-1000   insulin aspart  0-9 Units Subcutaneous TID WC   lactulose  20 g Oral BID   losartan  50 mg Oral QPC breakfast   pantoprazole  20 mg Oral Daily   QUEtiapine  50 mg Oral QHS   sodium chloride flush  3 mL Intravenous Q12H   Continuous Infusions:  piperacillin-tazobactam 3.375 g (11/06/22 1000)     LOS: 1 day   Sidney Ace, MD [facility]   If 7PM-7AM, please contact night-coverage  11/06/2022, 11:34 AM

## 2022-11-06 NOTE — Transfer of Care (Signed)
Immediate Anesthesia Transfer of Care Note  Patient: [name]  Procedure(s) Performed: XI ROBOTIC LAPAROSCOPIC ASSISTED APPENDECTOMY  Patient Location: PACU  Anesthesia Type:General  Level of Consciousness: awake, alert , and oriented  Airway & Oxygen Therapy: Patient Spontanous Breathing and Patient connected to face mask oxygen  Post-op Assessment: Report given to RN and Post -op Vital signs reviewed and stable  Post vital signs: Reviewed and stable  Last Vitals:  Vitals Value Taken Time  BP    Temp    Pulse 60 11/06/22 0019  Resp    SpO2 100 % 11/06/22 0019  Vitals shown include unvalidated device data.  Last Pain:  Vitals:   11/05/22 2220  TempSrc:   PainSc: 10-Worst pain ever         Complications: No notable events documented.

## 2022-11-06 NOTE — Anesthesia Postprocedure Evaluation (Signed)
Anesthesia Post Note  Patient: [name]  Procedure(s) Performed: XI ROBOTIC LAPAROSCOPIC ASSISTED APPENDECTOMY  Patient location during evaluation: PACU Anesthesia Type: General Level of consciousness: awake and alert Pain management: pain level controlled Vital Signs Assessment: post-procedure vital signs reviewed and stable Respiratory status: spontaneous breathing, nonlabored ventilation, respiratory function stable and patient connected to nasal cannula oxygen Cardiovascular status: blood pressure returned to baseline and stable Postop Assessment: no apparent nausea or vomiting Anesthetic complications: no   No notable events documented.   Last Vitals:  Vitals:   11/06/22 0217 11/06/22 0320  BP: 128/62 118/64  Pulse: (!) 56 66  Resp: 16   Temp: 36.9 C 36.7 C  SpO2: 97% 98%    Last Pain:  Vitals:   11/06/22 0217  TempSrc: Oral  PainSc:                  Ilene Qua

## 2022-11-06 NOTE — Progress Notes (Signed)
Patient ID: LORENSO ELLMAN, male   DOB: 24-Jan-1951, 72 y.o.   MRN: [id number]     [place] Hospital Day(s): 1.   Patient admitted with acute appendicitis.  He underwent robotic appendectomy.  Patient with history of cirrhosis with portal hypertension.  Liver disease classified at child pugh B, which has 30% mortality from surgical intervention.  Interval History: Patient seen and examined, no acute events or new complaints overnight. Patient reports significant pain in the lower abdomen.  Vital signs in last 24 hours: [min-max] current  Temp:  [97.7 F (36.5 C)-98.5 F (36.9 C)] 97.8 F (36.6 C) (03/12 0859) Pulse Rate:  [47-66] 66 (03/12 0859) Resp:  [10-18] 16 (03/12 0217) BP: (109-132)/(47-64) 112/49 (03/12 0859) SpO2:  [95 %-100 %] 98 % (03/12 0859) Weight:  [92 kg] 92 kg (03/11 1126)       Weight: 92 kg BMI (Calculated): 32.75   Physical Exam:  Constitutional: alert, cooperative and no distress  Respiratory: breathing non-labored at rest  Cardiovascular: regular rate and sinus rhythm  Gastrointestinal: soft, non-tender, and non-distended.  The wounds are dry and clean  Labs:     Latest Ref Rng & Units 11/06/2022    4:55 AM 11/05/2022    7:11 PM 11/05/2022   11:27 AM  CBC  WBC 4.0 - 10.5 K/uL 3.0  2.5  2.7   Hemoglobin 13.0 - 17.0 g/dL 10.8  10.3  10.4   Hematocrit 39.0 - 52.0 % 32.9  31.0  32.0   Platelets 150 - 400 K/uL 82  100  98       Latest Ref Rng & Units 11/06/2022    4:55 AM 11/05/2022   11:27 AM 11/01/2022    4:18 AM  CMP  Glucose 70 - 99 mg/dL 163  89  79   BUN 8 - 23 mg/dL '15  14  13   '$ Creatinine 0.61 - 1.24 mg/dL 1.02  0.88  0.81   Sodium 135 - 145 mmol/L 136  138  141   Potassium 3.5 - 5.1 mmol/L 4.0  4.0  3.6   Chloride 98 - 111 mmol/L 106  110  111   CO2 22 - 32 mmol/L '21  23  21   '$ Calcium 8.9 - 10.3 mg/dL 8.7  9.0  8.5   Total Protein 6.5 - 8.1 g/dL 6.0  5.8  6.5   Total Bilirubin 0.3 - 1.2 mg/dL 1.8  2.0  2.0   Alkaline Phos 38 - 126  U/L 124  119  147   AST 15 - 41 U/L 37  38  62   ALT 0 - 44 U/L 23  23  33     Imaging studies: No new pertinent imaging studies   Assessment/Plan:  72 y.o. male with appendicitis 1 Day Post-Op s/p robotic assisted laparoscopic appendectomy, complicated by pertinent comorbidities including end-stage liver disease.  -So far (8 hours from surgery) patient has been doing well and recovering slowly but adequately. -He is very high risk for complication from medical standpoint.  I think the patient should be observed for at least 24 more hours. -I will continue with soft diet -Encourage patient to ambulate -If no clinical consideration, I think that patient will be able to be discharged tomorrow.  Arnold Long, MD

## 2022-11-07 ENCOUNTER — Encounter: Payer: Self-pay | Admitting: Internal Medicine

## 2022-11-07 LAB — GLUCOSE, CAPILLARY
Glucose-Capillary: 132 mg/dL — ABNORMAL HIGH (ref 70–99)
Glucose-Capillary: 177 mg/dL — ABNORMAL HIGH (ref 70–99)
Glucose-Capillary: 143 mg/dL — ABNORMAL HIGH (ref 70–99)
Glucose-Capillary: 241 mg/dL — ABNORMAL HIGH (ref 70–99)

## 2022-11-07 LAB — BASIC METABOLIC PANEL
Anion gap: 5 (ref 5–15)
BUN: 26 mg/dL — ABNORMAL HIGH (ref 8–23)
CO2: 22 mmol/L (ref 22–32)
Calcium: 8.5 mg/dL — ABNORMAL LOW (ref 8.9–10.3)
Chloride: 106 mmol/L (ref 98–111)
Creatinine, Ser: 1.23 mg/dL (ref 0.61–1.24)
GFR, Estimated: >60 mL/min (ref >60)
Glucose, Bld: 140 mg/dL — ABNORMAL HIGH (ref 70–99)
Potassium: 4.7 mmol/L (ref 3.5–5.1)
Sodium: 133 mmol/L — ABNORMAL LOW (ref 135–145)

## 2022-11-07 LAB — SURGICAL PATHOLOGY

## 2022-11-07 LAB — HEPATITIS B SURFACE ANTIGEN: Hepatitis B Surface Ag: REACTIVE — AB

## 2022-11-07 LAB — HCV RNA QUANT: HCV Quantitative: NOT DETECTED IU/mL (ref >50)

## 2022-11-07 LAB — MAGNESIUM: Magnesium: 2.1 mg/dL (ref 1.7–2.4)

## 2022-11-07 LAB — PHOSPHORUS: Phosphorus: 3.3 mg/dL (ref 2.5–4.6)

## 2022-11-07 NOTE — Progress Notes (Signed)
Patient ID: [name], male   DOB: 02/03/1951, 72 y.o.   MRN: [id number]     [place] Hospital Day(s): 2.   Interval History: Patient seen and examined, no acute events or new complaints overnight. Patient was seen sleeping today.  No report for any complication.  Patient tolerating diet.  Vital signs in last 24 hours: [min-max] current  Temp:  [97.8 F (36.6 C)-98.3 F (36.8 C)] 98.1 F (36.7 C) (03/13 0549) Pulse Rate:  [55-66] 55 (03/13 0549) Resp:  [16-18] 18 (03/13 0549) BP: (103-112)/(49-62) 103/52 (03/13 0549) SpO2:  [97 %-98 %] 98 % (03/13 0549) Weight:  [118.4 kg-118.5 kg] 118.5 kg (03/13 0500)       Weight: 118.5 kg BMI (Calculated): 42.19   Physical Exam:  Constitutional: Sleepy, no distress Respiratory: breathing non-labored at rest  Cardiovascular: regular rate and sinus rhythm  Gastrointestinal: soft, non-tender, and non-distended  Labs:     Latest Ref Rng & Units 11/06/2022    4:55 AM 11/05/2022    7:11 PM 11/05/2022   11:27 AM  CBC  WBC 4.0 - 10.5 K/uL 3.0  2.5  2.7   Hemoglobin 13.0 - 17.0 g/dL 10.8  10.3  10.4   Hematocrit 39.0 - 52.0 % 32.9  31.0  32.0   Platelets 150 - 400 K/uL 82  100  98       Latest Ref Rng & Units 11/07/2022    5:26 AM 11/06/2022    4:55 AM 11/05/2022   11:27 AM  CMP  Glucose 70 - 99 mg/dL 140  163  89   BUN 8 - 23 mg/dL '26  15  14   '$ Creatinine 0.61 - 1.24 mg/dL 1.23  1.02  0.88   Sodium 135 - 145 mmol/L 133  136  138   Potassium 3.5 - 5.1 mmol/L 4.7  4.0  4.0   Chloride 98 - 111 mmol/L 106  106  110   CO2 22 - 32 mmol/L '22  21  23   '$ Calcium 8.9 - 10.3 mg/dL 8.5  8.7  9.0   Total Protein 6.5 - 8.1 g/dL  6.0  5.8   Total Bilirubin 0.3 - 1.2 mg/dL  1.8  2.0   Alkaline Phos 38 - 126 U/L  124  119   AST 15 - 41 U/L  37  38   ALT 0 - 44 U/L  23  23     Imaging studies: No new pertinent imaging studies   Assessment/Plan:  72 y.o. male with acute appendicitis 2 Days Post-Op s/p robotic assisted laparoscopic  appendectomy.  -No sign of clinical observation from liver standpoint. -No sign of complication from surgery -No contraindication to discharge patient home if medically stable. -I will follow-up patient in my office in 2 weeks for wound follow-up.  Arnold Long, MD

## 2022-11-07 NOTE — Discharge Instructions (Signed)
Rent/Utility/Housing  Agency Name: [facility] Agency Address: [address] [place], [place] [address] Phone: [phone] Email: [email or web address] Website: [email or web address].org Service(s) Offered: Housing services, self-sufficiency, congregate meal  program, weatherization program, Administrator, sports program, emergency food assistance,  housing counseling, home ownership program, wheels -towork program.  Agency Name: [facility] Address: [id number] [place], [place] [address] Phone: [phone] (8p- 10p) Email: [email or web address] Website: [email or web address].[email or web address].org Service(s) Offered: A program for homeless and/or needy men that includes one-on-one counseling, life skills training and job rehabilitation.  Agency Name: [facility] [place] Address: [address], [place], [place] [address] Phone: [phone] Website: www.alliedchurches.org Service(s) Offered: Assistance to needy in emergency with utility bills, heating  fuel, and prescriptions. Shelter for homeless 7pm-7am. December 20, 2016 15  Agency Name: [facility] [place] (Developmentally Disabled) Address: [address], [place], [place] [address] Phone: [phone] Contact Person: Susanne Greenhouse Email: [email or web address] Website: [email or web address] Service(s) Offered: Helps individuals with developmental disabilities move  from housing that is more restrictive to homes where they  can achieve greater independence and have more  opportunities.  Agency Name: [employer] Address: [address], [place], [place] [address] Phone: [phone] Email: [email or web address] Website: www.burlingtonhousingauthority.org Service(s) Offered: Provides affordable housing for low-income families,  elderly, and disabled individuals. Offer a wide range of  programs and services, from financial planning  to afterschool and summer programs.  Agency Name: [facility] Address: [address] [place], [place] [address] Phone: [phone] Service(s) Offered: Child support services; child welfare services; food stamps;  Medicaid; work first family assistance; and aid with fuel,  rent, food and medicine.  Agency Name: [facility] [place]. Address: [facility] [address], [place], [place]  [address] Phone: [phone] Website: www.familyabuseservices.org Service(s) Offered: 24 hour Crisis Line: [phone]; 24 hour Emergency Shelter;  Transitional Housing; Support Groups; Lexicographer;  [employer]; Hispanic Outreach: [phone];  [place]: [phone]. December 20, 2016 16  Agency Name: [facility]. Address: [address] [place], [place] [address] Phone: [phone] Service(s) Offered: CAP Services; Home and [employer]; Individual  or Group Supports; Respite Care Non-Institutional Nursing;  Residential Supports; Respite Care and [place]; Transportation; Family and Friends Night;  Recreational Activities; Three Nutritious Meals/Snacks;  Consultation with Registered Dietician; Twenty-four hour  Registered Nurse Access; Daily and [employer]; Camp [place]aves; [place] for the [employer] (During Summer Months) Bingo Night (Every  Wednesday Night); Special Populations Dance Night  (Every Tuesday Night); Professional Hair Care Services.  Agency Name: [facility] Address: [address], [place], [place] [address] Phone: [phone] Contact Person: Flonnie Hailstone Website: [email or web address].Pharmacist, hospital) Offered: Residential treatment facility for women; food and  clothing, educational & employment development and  transportation to work; Psychologist, counselling of financial skills;  parenting and family  reunification; emotional and spiritual  support; transitional housing for program graduates.  Agency Name: [employer] Address: [address], [place], [place] [address] Phone: [phone] Email: [email or web address] Website: http://www.[email or web address]/ Service(s) Offered: Public housing units for elderly, disabled, and low income  people; housing choice vouchers for income eligible  applicants; shelter plus care vouchers; and [employer] program. December 20, 2016 17  Agency Name: [facility] Address: [place] [address], [place], [place] [address] Phone: [phone] Email: [email or web address] Website: www.habitatalamance.org Service(s) Offered: Build houses for families in need of decent housing. Each  adult in the family must invest 200 hours of labor on  someone else's house, work with volunteers to build their  own house, attend classes on budgeting, home maintenance, yard care, and attend homeowner association  meetings.  Agency Name: [facility] Address: [place] [address], [place], [place] [address] Phone: [phone] Website: www.rsli.org Service(s) Offered: Intermediate care facilities for mentally retarded,  Supervised Living in group homes for adults with  developmental disabilities, Supervised Living for people  who have dual diagnoses (MRMI), Independent Living,  Supported Living, respite and a variety of CAP services,  pre-vocational services, day supports, and [employer].  Agency Name: [place] Phone: [phone] Website: www.NCForeclosurePrevention.gov Service(s) Offered: Zero-interest, deferred loans to homeowners struggling to  pay their mortgage. Call for more information   Transportation Agency Name: [facility] Agency Address: [address] [place], [place] [address] Phone: [phone] Email: [email or web address] Website:  www.alamanceservices.org Service(s) Offered: Universal Health, self-sufficiency, congregate meal  program, weatherization program, Administrator, sports program, emergency food assistance,  housing counseling, home ownership program, wheels-towork program. December 20, 2016 22  Agency Name: [facility] [phone]) Address: [place], [place], [place] [address] Phone: [phone] Email:  Website: www.acta-[email or web address].com Service(s) Offered: Transportation for general public, subscription and demand  response; Dial-a-Ride for citizens 31 years of age or older. Agency Name: Department of Social Services Address: [address] [place], [place] [address] Phone: [phone] Service(s) Offered: Child support services; child welfare services; food stamps;  Medicaid; work first family assistance; and aid with fuel,  rent, food and medicine, transportation assistance.  Agency Name: Disabled [employer] ([place]) Transportation  Network Address: Phone: [phone] Service(s) Offered: Transports veterans to the [facility] medical center. Call  forty-eight hours in advance and leave the name, telephone  number, date, and time of appointment. Veteran will be  contacted by the driver the day before the appointment to  arrange a pick up point    Rew Name: [facility] Agency Address: [address], [place], [place] [address] Phone: [phone] Website: www.alamanceservices.org  Service(s) Offered: Housing services, self-sufficiency, congregate meal  program, weatherization program, Administrator, sports program, emergency food assistance,  housing counseling, home ownership program, wheels -towork program. Meals free for 60 and older at various  locations from 9am-1pm, Monday-Friday:  [employer], [place], [place], [address],  [place]   [facility], [place] [phone], [address],   [place], [place]  Agency Name: [facility] on Wheels Address: [place] [address] [place], [place], [place] [address] Phone: [phone] Website: www.alamancemow.org Service(s) Offered: Home delivered hot, frozen, and emergency  meals. Grocery assistance program which matches  volunteers one-on-one with seniors unable to grocery shop  for themselves. Must be 60 years and older; less than 20  hours of in-home aide service, limited or no driving ability;  live alone or with someone with a disability; live in  [place].  Agency Name: Software [facility] ([place]) Address: [address], [place], [place] [address] Phone: [phone] Service(s) Offered: Food is served to [place], homeless, elderly, and low  income people in the community every Saturday (11:30  am-12:30 pm) and Sunday (12:30 pm-1:30pm). Volunteers  also offer help and encouragement in seeking employment,  and spiritual guidance. December 20, 2016 8  Agency Name: Department of Social Services Address: [address] [place], [place] [address] Phone: [phone] Service(s) Offered: Child support services; child welfare services; food stamps;  Medicaid; work first family assistance; and aid with fuel,  rent, food and medicine.  Agency Name: Chemical [facility] Address: [address], [place], [place] Phone: [phone] Website: www.dreamalign.com Services Offered: Monday 10:00am-12:00, 8:00pm-9:00pm, and Friday  10:00am-12:00. Agency Name: [facility] [place] Address: [address], [place], [place] [address] Phone: [phone] Website: www.alliedchurches.org Service(s) Offered: Serves weekday meals, open from 11:30 am- 1:00 pm., and  6:30-7:30pm, Monday-Wednesday-Friday distributes food  3:30-6pm,  Monday-Wednesday-Friday.  Agency Name: [facility] Address: [address] [place], [place] Phone: [phone] Website: [email or web address].[email or web address].org Services Offered: Distributes food the 4th Saturday of the month, starting at  8:00 am Agency Name: [facility] Address: [phone] [place], [place] [address] Phone: [phone] Website: http://hbc.Ada.net Service(s) Offered: Bread of life, weekly food pantry. Open Wednesdays from  10:00am-noon.  Agency Name: [facility] Address: [place], [place], [place] Phone: [phone] Services Offered: Distributes food 9am-1pm, Monday-Thursday. Call for details.   Agency Name: [place] Address: [address] [place], [place] [address] Phone: [phone] Website: firstbaptistburlington.com Service(s) Offered: Youth worker. Call for assistance. Agency Name: [facility] Address: [address], [place], [place] [address] Phone: [phone] Service Offered: Emergency Food Pantry. Call for appointment.  Agency Name: [facility] Address: [address], [place], [place] [address] Phone: [phone] Website: [email or web address].com Services Offered: Youth worker. Call for details Agency Name: [facility] Address: [place], [place] Phone: [phone] Website: [email or web address] Service(s) Offered: Emergency Food Pantry. Call for details.  Agency Name: [facility] Address: [address], [place], [place] [address] Phone: [phone] or [phone] Website: [email or web address].[email or web address] Service(s) Offered: Distribute food 9am-11:30 am, Tuesday-Friday, and 1- 3:30pm, Monday-Friday. Food pantry Monday-Friday  1pm-3pm, fresh items, Mon.-Wed.-Fri.  Agency Name: [facility] Address: [address] [place], [place] [address] Phone:  [phone] Website: www.safealamance.org Services Offered: Distribute food Tues and Sats from 9:00am-noon. Closed  1st Saturday of each month. Call for details

## 2022-11-07 NOTE — TOC Initial Note (Signed)
Transition of Care [facility]) - Initial/Assessment Note    Patient Details  Name: [name] MRN: [id number] Date of Birth: 08/08/1951  [facility]) CM/SW Contact:    Beverly Sessions, RN Phone Number: 11/07/2022, 1:46 PM  Clinical Narrative:                  Admitted for:2 Days Post-Op s/p robotic assisted laparoscopic appendectomy.  Admitted from: Patient states he lives in a tent behind [place] [address]  Patient states that he has other friends that live around him.  Patient states that he has a roomey tent, with an air mattress.  Patients states that he has all of his medications, a RW, and "everything I need there, I have a good set up".  Patient confirms he plans to return at discharge.  Patient states that he is not interested in homeless shelter in [facility], and is not able to go to another county due to being on [place]  PCP: Posey Pronto - states he uses the bus to get to appointments  Pharmacy: Denies issues obtaining medications Current home health/prior home health/DME: RW  Patient will need a cab voucher at discharge Housing, transportation, and food resources added to AVS       Patient Goals and CMS Choice            Expected Discharge Plan and Services                                              Prior Living Arrangements/Services                       Activities of Daily Living Home Assistive Devices/Equipment: Cane (specify quad or straight) ADL Screening (condition at time of admission) Patient's cognitive ability adequate to safely complete daily activities?: Yes Is the patient deaf or have difficulty hearing?: No Does the patient have difficulty seeing, even when wearing glasses/contacts?: No Does the patient have difficulty concentrating, remembering, or making decisions?: No Patient able to express need for assistance with ADLs?: Yes Does the patient have difficulty dressing or bathing?: Yes Independently  performs ADLs?: Yes (appropriate for developmental age) Does the patient have difficulty walking or climbing stairs?: Yes Weakness of Legs: None Weakness of Arms/Hands: None  Permission Sought/Granted                  Emotional Assessment              Admission diagnosis:  Influenza-like illness [J11.1] Acute appendicitis [K35.80] Patient Active Problem List   Diagnosis Date Noted   Acute appendicitis 11/05/2022   Pancytopenia ([place]) 11/05/2022   Sinus bradycardia 0000000   Acute metabolic encephalopathy [id number]   Liver lesion 09/22/2022   Liver cirrhosis ([place]) 09/22/2022   HLD (hyperlipidemia) 09/22/2022   Diabetes mellitus without complication ([place]) [id number]   Obesity (BMI 30-39.9) 09/22/2022   Acute hepatic encephalopathy ([place]) 09/22/2022   MDD (major depressive disorder), recurrent severe, without psychosis ([name]) 09/17/2022   Major depressive disorder, 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 psychotic features ([place]) 09/15/2022   Protein-calorie malnutrition, severe ([place]) 08/31/2022   Hypotension 08/31/2022   Right sided weakness    Hx of transient ischemic attack (TIA) 04/28/2022   Obesity    Chronic diastolic CHF (congestive heart failure) ([place])    Depression    Sepsis ([place]) 03/28/2022  Bacteremia due to Klebsiella pneumoniae 03/28/2022   Hypoglycemia 03/28/2022   Syncope 03/24/2022   Hypokalemia 03/24/2022   Fever 03/24/2022   COPD (chronic obstructive pulmonary disease) (HCC)    Elevated troponin    Abnormal LFTs    History of hepatitis C    AKI (acute kidney injury) ([place])    Pure hypercholesterolemia    Obesity, Class III, BMI 40-49.9 (morbid obesity) ([place]) 04/18/2020   Thrombocytopenia ([place]) 04/17/2020   Leukopenia 04/17/2020   HTN (hypertension) 04/17/2018   Uncontrolled type 2 diabetes mellitus with hyperglycemia, without long-term current use of insulin ([place]) 04/17/2018   Lymphedema 04/17/2018   Anemia 04/07/2018   Chest pain 03/20/2018    Unsheltered homelessness 12/16/2017   PCP:  Denton Lank, MD Pharmacy:   [facility] The [address]), [place]  [address] Phone: [phone] Fax: [place], [place] [address] Phone: [phone] Fax: [phone]     Social Determinants of Health (SDOH) Social History: SDOH Screenings   Food Insecurity: Food Insecurity Present (11/06/2022)  Housing: High Risk (11/06/2022)  Transportation Needs: Unmet Transportation Needs (11/06/2022)  Utilities: Not At Risk (11/06/2022)  Alcohol Screen: Low Risk  (09/24/2022)  Depression (PHQ2-9): High Risk (04/26/2022)  Financial Resource Strain: High Risk (04/27/2022)  Physical Activity: Sufficiently Active (04/16/2018)  Social Connections: Moderately Isolated (04/30/2018)  Stress: Stress Concern Present (04/16/2018)  Tobacco Use: Medium Risk (11/06/2022)   SDOH Interventions:     Readmission Risk Interventions    11/07/2022    1:45 PM  Readmission Risk Prevention Plan  Transportation Screening Complete  SW Recovery Care/Counseling Consult Complete  Palliative Care Screening Not Applicable  [place] Not Applicable

## 2022-11-07 NOTE — Progress Notes (Signed)
Progress Note   Patient: [name] [id number] DOB: August 04, 1951 DOA: 11/05/2022     2 DOS: the patient was seen and examined on 11/07/2022   Brief hospital course:  72 y.o. 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   [name] states that he has been experiencing daily nausea, vomiting and diarrhea that has been persistent for at least 2 to 3 weeks now.  He was seen in the ED for this several days ago and he states his symptoms are unchanged.  He feels that it is likely secondary to lactulose use.  He denies any fever, chills, shortness of breath, chest pain.  He states that vomiting and diarrhea are nonmelanotic, nonbloody.  He endorses lower extremity swelling that appears improved.  He denies any abdominal distention.   General surgery consulted from admission.  Took patient emergently to operating room early a.m. on 3/12.  Patient tolerated procedure well.  Placed on soft diet.  Tolerating well.  Per surgery recommendations recommend additional 24 hours of inpatient monitoring prior to discharge planning.  Assessment and Plan:  Acute appendicitis  Patient presented with several week history of nausea and diarrhea that has acutely worsened with CT imaging consistent with possible acute appendicitis.   General surgery evaluated patient and he was taken to the OR emergently.   Patient now status post laparoscopic appendectomy    Dilated CBD Seen on CT abdomen pelvis.  No obvious signs of obstructive stones.  No clinical indication of cholangitis.  Patient is status post laparoscopic cholecystectomy years ago.  LFTs normal.  Low/no suspicion for ascending cholangitis.  Patient will be on empiric Zosyn for today.  Will check right upper quadrant ultrasound.  If ERCP is indicated patient may need to be transferred  however clinical suspicion of obstructing CBD stone is low     Liver cirrhosis ([place]) Patient has a history of cirrhosis, likely secondary to hepatitis C with positive viral load in 2019.  Currently child Pugh class B with score of 9 points.   Acute viral panel approximately 1 month ago also demonstrated positive for hepatitis B antigen.   He has not established with GI at this time.  He is on lactulose for hepatic encephalopathy, that is likely the reason for his diarrhea.  Lactulose dose was decreased to 20 g twice a day due to increased frequency of diarrhea    Pancytopenia (HCC) Chronic and stable at this time.  Will continue to monitor while admitted.  Likely secondary to hepatic cirrhosis   COPD (chronic obstructive pulmonary disease) (HCC) No wheezing on examination at this time. As needed bronchodilator therapy   HTN (hypertension) Continue home antihypertensives   Chronic diastolic CHF (congestive heart failure) ([place]) Patient appears essentially euvolemic at this time with only minimal lower extremity edema but no pulmonary edema.  Monitor volume status while admitted   Diabetes mellitus without complication ([place]) - Hold home antiglycemic agents - SSI, moderate   Sinus bradycardia Chronic and stable at this time.   Unsheltered homelessness - TOC consultation placed           Subjective: Patient is seen and examined at bedside.  Complains of pain around his surgical incision which he rates a 5 x 10 in intensity at its worst.  According to patient unable to ambulate due to pain.  Physical Exam: Vitals:  11/06/22 1910 11/07/22 0500 11/07/22 0549 11/07/22 0745  BP: 112/62  (!) 103/52 (!) 100/55  Pulse: 64  (!) 55 (!) 54  Resp: '16  18 18  '$ Temp: 98.3 F (36.8 C)  98.1 F (36.7 C) 97.7 F (36.5 C)  TempSrc: Oral   Oral  SpO2: 97%  98% 98%  Weight:  118.5 kg     Physical Exam Vitals and nursing note reviewed.  Constitutional:      Appearance: He is obese.      Comments: Chronically ill-appearing  HENT:     Head: Normocephalic and atraumatic.     Nose: Nose normal.     Mouth/Throat:     Mouth: Mucous membranes are moist.  Eyes:     Conjunctiva/sclera: Conjunctivae normal.  Cardiovascular:     Rate and Rhythm: Bradycardia present.  Pulmonary:     Effort: Pulmonary effort is normal.     Breath sounds: Normal breath sounds.  Abdominal:     General: Abdomen is flat. Bowel sounds are normal.     Palpations: Abdomen is soft.     Tenderness: There is abdominal tenderness.     Comments: Tender around incision sites  Musculoskeletal:        General: Normal range of motion.     Cervical back: Normal range of motion and neck supple.  Skin:    General: Skin is warm and dry.  Neurological:     General: No focal deficit present.     Mental Status: He is alert.  Psychiatric:        Mood and Affect: Mood normal.        Behavior: Behavior normal.        Data Reviewed: Labs reviewed.  There are no new results to review at this time.  Family Communication:   Disposition: Status is: Inpatient Remains inpatient appropriate because: Pain control postop with plans for discharge in a.m.  Planned Discharge Destination: Home    Time spent: 30 minutes  Author: Collier Bullock, MD 11/07/2022 1:09 PM  For on call review [email or web address].

## 2022-11-08 ENCOUNTER — Ambulatory Visit: Payer: 59

## 2022-11-08 LAB — GLUCOSE, CAPILLARY
Glucose-Capillary: 105 mg/dL — ABNORMAL HIGH (ref 70–99)
Glucose-Capillary: 152 mg/dL — ABNORMAL HIGH (ref 70–99)

## 2022-11-08 LAB — HEPATITIS B SURFACE ANTIBODY, QUANTITATIVE: Hep B S AB Quant (Post): <3.1 m[IU]/mL — ABNORMAL LOW (ref >9.9)

## 2022-11-08 MED ORDER — OXYCODONE HCL 5 MG PO TABS: 5.0000 mg | ORAL_TABLET | Freq: Four times a day (QID) | ORAL | 0 refills | Status: AC | PRN

## 2022-11-08 MED ORDER — LACTULOSE 10 GM/15ML PO SOLN: 20.0000 g | Freq: Two times a day (BID) | ORAL | 0 refills | Status: DC

## 2022-11-08 NOTE — Plan of Care (Signed)
IV removed, discharge instructions reviewed, printed Rx given, and patient to be transported back to tent via [facility]

## 2022-11-08 NOTE — TOC Transition Note (Signed)
Transition of Care [facility]) - CM/SW Discharge Note   Patient Details  Name: [name] MRN: [id number] Date of Birth: 04/22/1951  Transition of Care [facility]) CM/SW Contact:  Beverly Sessions, RN Phone Number: 11/08/2022, 11:41 AM   Clinical Narrative:      Therapy recommending outpatient PT.  Patient in agreement and would like outpatient therapy at [facility] Referral form faxed Cab voucher provided for discharge.  Bedside RN to call at discharge        Patient Goals and CMS Choice      Discharge Placement                         Discharge Plan and Services Additional resources added to the After Visit Summary for                                       Social Determinants of Health (SDOH) Interventions SDOH Screenings   Food Insecurity: Food Insecurity Present (11/06/2022)  Housing: High Risk (11/06/2022)  Transportation Needs: Unmet Transportation Needs (11/06/2022)  Utilities: Not At Risk (11/06/2022)  Alcohol Screen: Low Risk  (09/24/2022)  Depression (PHQ2-9): High Risk (04/26/2022)  Financial Resource Strain: High Risk (04/27/2022)  Physical Activity: Sufficiently Active (04/16/2018)  Social Connections: Moderately Isolated (04/30/2018)  Stress: Stress Concern Present (04/16/2018)  Tobacco Use: Medium Risk (11/07/2022)     Readmission Risk Interventions    11/07/2022    1:45 PM  Readmission Risk Prevention Plan  Transportation Screening Complete  SW Recovery Care/Counseling Consult Complete  Palliative Care Screening Not Applicable  [place] Not Applicable

## 2022-11-08 NOTE — Progress Notes (Signed)
Mobility Specialist - Progress Note     11/08/22 1154  Mobility  Activity Ambulated with assistance in hallway  Level of Assistance Contact guard assist, steadying assist  Assistive Device Front wheel walker  Distance Ambulated (ft) 55 ft  Activity Response Tolerated well  Mobility Referral Yes  $Mobility charge 1 Mobility   Pt resting in bed on RA upon entry. Pt endorses 10/10 pain but agreeable to ambulation despite author attempt to only ambulate in room. Pt STS and walks to hallway with AD with stable gait but visually grimaces. Pt returned to bed and left with needs in reach (RN Notified of pain status).   Loma Sender Mobility Specialist 11/08/22, 11:57 AM

## 2022-11-08 NOTE — Evaluation (Signed)
Physical Therapy Evaluation Patient Details Name: [name] MRN: [id number] DOB: 14-Feb-1951 Today's Date: 11/08/2022  History of Present Illness  Patient is a 72 year old male with acute appendicitis now status post laparoscopic appendectomy. 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 He reports getting up several times to walk to the bathroom already. He reports living in a tent and ambulates with a rollator in the community and a cane around the tent.  Today, the patient has some pain around the incision site with mobility. Education provided on best technique for bed mobility and bracing for pain management. No physical assistance is required with mobility today. He walked a lap in the hallway with the rolling walker with occasional cues to bring rolling walker closer to base of support. Encourage patient to use his rollator at discharge for safety with mobility. He reports chronic leg weakness and is interested in outpatient PT here at the hospital at discharge. PT will follow up while patient is here to maximize independence and decrease caregiver burden.       Recommendations for follow up therapy are one component of a multi-disciplinary discharge planning process, led by the attending physician.  Recommendations may be updated based on patient status, additional functional criteria and insurance authorization.  Follow Up Recommendations Outpatient PT      Assistance Recommended at Discharge PRN  Patient can return home with the following  Help with stairs or ramp for entrance;Assist for transportation    Equipment Recommendations None recommended by PT  Recommendations for Other Services       Functional Status Assessment Patient has had a recent decline in their functional status and demonstrates the ability to make  significant improvements in function in a reasonable and predictable amount of time.     Precautions / Restrictions Precautions Precautions: Fall Restrictions Weight Bearing Restrictions: No      Mobility  Bed Mobility Overal bed mobility: Needs Assistance Bed Mobility: Supine to Sit, Sit to Supine     Supine to sit: Supervision Sit to supine: Supervision   General bed mobility comments: education provided for techniques to get in and out of bed, bracing incision site for pain management. no physical assistance required    Transfers Overall transfer level: Modified independent Equipment used: Rolling walker (2 wheels) Transfers: Sit to/from Stand Sit to Stand: Modified independent (Device/Increase time)                Ambulation/Gait Ambulation/Gait assistance: Supervision Gait Distance (Feet): 200 Feet Assistive device: Rolling walker (2 wheels) Gait Pattern/deviations: Step-through pattern Gait velocity: decreased     General Gait Details: cues to stand closer to rolling walker for support with ambulation. patient uses a rollator at baseline which is encouraged so that patient can take seated rest breaks as needed with community ambulation  Stairs            Wheelchair Mobility    Modified Rankin (Stroke Patients Only)       Balance Overall balance assessment: Modified Independent                                           Pertinent Vitals/Pain Pain Assessment Pain Assessment: Faces Faces Pain Scale: Hurts little more Pain Location: L flank, mostly with  bed mobility Pain Descriptors / Indicators: Grimacing, Discomfort Pain Intervention(s): Limited activity within patient's tolerance, Monitored during session, Repositioned    Home Living Family/patient expects to be discharged to:: Shelter/Homeless                 Home Equipment: Rollator (4 wheels);Cane - single point;BSC/3in1 Additional Comments: lives in a tent  behind [place]    Prior Function Prior Level of Function : Independent/Modified Independent             Mobility Comments: walks with a rollator in the community. takes the bus for transportation ADLs Comments: Mod I, uses a bed side commode in the tent for toileting     Hand Dominance        Extremity/Trunk Assessment   Upper Extremity Assessment Upper Extremity Assessment: Overall WFL for tasks assessed    Lower Extremity Assessment Lower Extremity Assessment: Generalized weakness       Communication   Communication: No difficulties  Cognition Arousal/Alertness: Awake/alert Behavior During Therapy: WFL for tasks assessed/performed Overall Cognitive Status: Within Functional Limits for tasks assessed                                          General Comments General comments (skin integrity, edema, etc.): patient is generally deconditioned and reports notable decrease in leg strength recently. he is requesting to go to outpatient PT    Exercises     Assessment/Plan    PT Assessment Patient needs continued PT services  PT Problem List Decreased strength;Decreased activity tolerance;Decreased balance;Decreased mobility;Decreased safety awareness;Pain       PT Treatment Interventions DME instruction;Gait training;Stair training;Functional mobility training;Therapeutic activities;Therapeutic exercise;Balance training;Neuromuscular re-education;Patient/family education    PT Goals (Current goals can be found in the Care Plan section)  Acute Rehab PT Goals Patient Stated Goal: to have better pain control and to go to outpatient PT at the hospital for chronic weakness PT Goal Formulation: With patient Time For Goal Achievement: 11/22/22 Potential to Achieve Goals: Good    Frequency Min 2X/week     Co-evaluation               AM-PAC PT "6 Clicks" Mobility  Outcome Measure Help needed turning from your back to your side while in a flat  bed without using bedrails?: None Help needed moving from lying on your back to sitting on the side of a flat bed without using bedrails?: A Little Help needed moving to and from a bed to a chair (including a wheelchair)?: None Help needed standing up from a chair using your arms (e.g., wheelchair or bedside chair)?: None Help needed to walk in hospital room?: A Little Help needed climbing 3-5 steps with a railing? : None 6 Click Score: 22    End of Session   Activity Tolerance: Patient tolerated treatment well Patient left: in bed;with call bell/phone within reach   PT Visit Diagnosis: Muscle weakness (generalized) (M62.81);Pain Pain - Right/Left: Left Pain - part of body:  (flank)    Time: [id number] PT Time Calculation (min) (ACUTE ONLY): 18 min   Charges:   PT Evaluation $PT Eval Low Complexity: 1 Low PT Treatments $Therapeutic Activity: 8-22 mins       Minna Merritts, PT, MPT   Percell Locus 11/08/2022, 10:09 AM

## 2022-11-08 NOTE — Care Management Important Message (Signed)
Important Message  Patient Details  Name: [name] MRN: [id number] Date of Birth: 05-01-1951   Medicare Important Message Given:  N/A - LOS <3 / Initial given by admissions     [name] 11/08/2022, 12:11 PM

## 2022-11-08 NOTE — Discharge Summary (Signed)
Physician Discharge Summary   Patient: [name] MRN: [id number] DOB: 1951/01/06  Admit date:     11/05/2022  Discharge date: 11/08/22  Discharge Physician: Aiyah Scarpelli   PCP: Denton Lank, MD   Recommendations at discharge:   Keep scheduled follow-up appointment with Dr. Peyton Najjar Take medications as recommended  Discharge Diagnoses: Principal Problem:   Acute appendicitis Active Problems:   Liver cirrhosis ([place])   Pancytopenia (HCC)   COPD (chronic obstructive pulmonary disease) (HCC)   HTN (hypertension)   Chronic diastolic CHF (congestive heart failure) ([place])   Diabetes mellitus without complication ([place])   Unsheltered homelessness   Sinus bradycardia  Resolved Problems:   * No resolved hospital problems. *  Hospital Course: [name] is a 72 y.o. 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   [name] states that he has been experiencing daily nausea, vomiting and diarrhea that has been persistent for at least 2 to 3 weeks now.  He was seen in the ED for this several days ago and he states his symptoms are unchanged.  He feels that it is likely secondary to lactulose use.  He denies any fever, chills, shortness of breath, chest pain.  He states that vomiting and diarrhea are nonmelanotic, nonbloody.  He endorses lower extremity swelling that appears improved.  He denies any abdominal distention.   ED course: On arrival to the ED, patient was normotensive at 112/47 with heart rate of 47.  He was saturating at 95% on room air.  He was afebrile at 98.4.  Initial workup notable for WBC of 2.7, hemoglobin of 10.4, platelets of 98, bicarb 23, potassium 4.0, creatinine 0.88, albumin 2.2, AST 38, ALT 23, total bilirubin of 2.0 and GFR above 60.  CT of the abdomen was obtained that demonstrated  enlarged appendix with possible adjacent inflammation concerning for acute appendicitis, increasing CBD dilation currently at 3 cm, cirrhosis, portal hypertension, and unchanged 2.5 cm right hepatic mass suspicious for HCC.  General surgery consulted with plans for appendectomy.  [facility] contacted for admission.     Assessment and Plan: Acute appendicitis  Patient presented with several week history of nausea and diarrhea that acutely worsened with CT imaging consistent with possible acute appendicitis.   General surgery evaluated patient and he was taken to the OR emergently.   Patient now status post laparoscopic appendectomy Patient will be discharged home to follow-up with general surgery as an outpatient     Dilated CBD Seen on CT abdomen pelvis.  No obvious signs of obstructive stones.  No clinical indication of cholangitis.  Patient is status post laparoscopic cholecystectomy years ago.  LFTs normal.  Low/no suspicion for ascending cholangitis.    Liver cirrhosis ([place]) Patient has a history of cirrhosis, likely secondary to hepatitis C with positive viral load in 2019.  Currently child Pugh class B with score of 9 points.   Acute viral panel approximately 1 month ago also demonstrated positive for hepatitis B antigen.   He has not established with GI at this time.  He is on lactulose for hepatic encephalopathy, that is likely the reason for his diarrhea.  Lactulose dose was decreased to 20 g twice a day due to increased frequency of diarrhea     Pancytopenia (HCC) Chronic and stable at this time.   Likely secondary to hepatic cirrhosis  COPD (chronic obstructive pulmonary disease) (HCC) No wheezing on examination at this time. As needed bronchodilator therapy   HTN (hypertension) Continue    Chronic diastolic CHF (congestive heart failure) ([place]) Patient appears essentially euvolemic at this time with only minimal lower extremity edema but no pulmonary edema.     Diabetes  mellitus without complication (HCC) Continue metformin Blood sugar checks daily   Sinus bradycardia Chronic and stable at this time.   Unsheltered homelessness          Consultants: Surgery Procedures performed: Laparoscopic appendectomy Disposition: Home Diet recommendation:  Discharge Diet Orders (From admission, onward)     Start     Ordered   11/08/22 0000  Diet - low sodium heart healthy        11/08/22 1124   11/08/22 0000  Diet Carb Modified        11/08/22 1124           Carb modified diet DISCHARGE MEDICATION: Allergies as of 11/08/2022       Reactions   Bee Venom Anaphylaxis   Codeine Itching   Only itching. Recently allergy tested at [facility]   Novocain [procaine]    Sulfa Antibiotics Other (See Comments)        Medication List     TAKE these medications    albuterol 108 (90 Base) MCG/ACT inhaler Commonly known as: VENTOLIN HFA Inhale 2 puffs into the lungs every 6 (six) hours as needed for wheezing or shortness of breath.   ascorbic acid 500 MG tablet Commonly known as: VITAMIN C Take 1 tablet (500 mg total) by mouth daily.   aspirin EC 81 MG tablet Take 1 tablet (81 mg total) by mouth daily. Swallow whole.   atorvastatin 40 MG tablet Commonly known as: LIPITOR Take 1 tablet (40 mg total) by mouth daily.   benzonatate 100 MG capsule Commonly known as: TESSALON Take 100 mg by mouth 3 (three) times daily as needed.   citalopram 20 MG tablet Commonly known as: CELEXA Take 1 tablet (20 mg total) by mouth daily.   dicyclomine 20 MG tablet Commonly known as: BENTYL Take 1 tablet (20 mg total) by mouth every 6 (six) hours.   doxepin 50 MG capsule Commonly known as: SINEQUAN Take 1 capsule (50 mg total) by mouth at bedtime.   glucosamine-chondroitin 500-400 MG tablet Take 1 tablet by mouth 3 (three) times daily.   hydrOXYzine 10 MG tablet Commonly known as: ATARAX Take 1 tablet (10 mg total) by mouth 3 (three) times daily as needed  for anxiety.   Jardiance 10 MG Tabs tablet Generic drug: empagliflozin Take 1 tablet (10 mg total) by mouth daily.   lactulose 10 GM/15ML solution Commonly known as: CHRONULAC Take 30 mLs (20 g total) by mouth 2 (two) times daily. What changed:  how much to take when to take this   losartan 50 MG tablet Commonly known as: COZAAR Take 1 tablet (50 mg total) by mouth daily after breakfast.   metFORMIN 500 MG tablet Commonly known as: GLUCOPHAGE Take 500 mg by mouth daily. What changed: Another medication with the same name was removed. Continue taking this medication, and follow the directions you see here.   multivitamin with minerals Tabs tablet Take 1 tablet by mouth daily.   nitroGLYCERIN 0.4 MG SL tablet Commonly known as: NITROSTAT Place 1 tablet (0.4 mg total) under the tongue every 5 (five) minutes x 3 doses as needed for chest pain.   oxyCODONE 5 MG immediate release  tablet Commonly known as: Roxicodone Take 1 tablet (5 mg total) by mouth every 6 (six) hours as needed for up to 5 days for severe pain.   pantoprazole 20 MG tablet Commonly known as: PROTONIX Take 1 tablet (20 mg total) by mouth daily.   QUEtiapine 50 MG tablet Commonly known as: SEROQUEL Take 1 tablet (50 mg total) by mouth at bedtime.        Discharge Exam: Filed Weights   11/06/22 1637 11/07/22 0500 11/08/22 0448  Weight: 118.4 kg 118.5 kg 120 kg    Vitals and nursing note reviewed.  Constitutional:      Appearance: He is obese.     Comments: Chronically ill-appearing  HENT:     Head: Normocephalic and atraumatic.     Nose: Nose normal.     Mouth/Throat:     Mouth: Mucous membranes are moist.  Eyes:     Conjunctiva/sclera: Conjunctivae normal.  Cardiovascular:     Rate and Rhythm: Bradycardia present.  Pulmonary:     Effort: Pulmonary effort is normal.     Breath sounds: Normal breath sounds.  Abdominal:     General: Abdomen is flat. Bowel sounds are normal.     Palpations:  Abdomen is soft.     Tenderness: There is abdominal tenderness.     Comments: Tender around incision sites  Musculoskeletal:        General: Normal range of motion.     Cervical back: Normal range of motion and neck supple.  Skin:    General: Skin is warm and dry.  Neurological:     General: No focal deficit present.     Mental Status: He is alert.  Psychiatric:        Mood and Affect: Mood normal.        Behavior: Behavior normal.    Condition at discharge: stable  The results of significant diagnostics from this hospitalization (including imaging, microbiology, ancillary and laboratory) are listed below for reference.   Imaging Studies: US Abdomen Limited RUQ (LIVER/GB)  Result Date: 11/06/2022 CLINICAL DATA:  Abdominal pain. EXAM: ULTRASOUND ABDOMEN LIMITED RIGHT UPPER QUADRANT COMPARISON:  CT abdomen pelvis from yesterday. FINDINGS: Gallbladder: Surgically absent. Common bile duct: Diameter: 2.2 cm Liver: 6 mm cyst in the posterior right liver. Nodular liver contour with coarsened heterogeneous parenchymal echogenicity. Portal vein is patent on color Doppler imaging with normal direction of blood flow towards the liver. Other: Trace perihepatic ascites. IMPRESSION: 1. Unchanged common bile duct dilatation. Correlate with LFTs and consider further evaluation with ERCP. 2. Cirrhosis. Electronically Signed   By: Titus Dubin M.D.   On: 11/06/2022 13:09   CT ABDOMEN PELVIS W CONTRAST  Result Date: 11/05/2022 CLINICAL DATA:  72 year old male with acute abdominal and pelvic pain. History of cirrhosis. EXAM: CT ABDOMEN AND PELVIS WITH CONTRAST TECHNIQUE: Multidetector CT imaging of the abdomen and pelvis was performed using the standard protocol following bolus administration of intravenous contrast. RADIATION DOSE REDUCTION: This exam was performed according to the departmental dose-optimization program which includes automated exposure control, adjustment of the mA and/or kV according to  patient size and/or use of iterative reconstruction technique. CONTRAST:  163m OMNIPAQUE IOHEXOL 300 MG/ML  SOLN COMPARISON:  08/31/2022 MR, 08/30/2022 CT and prior studies FINDINGS: Lower chest: A trace RIGHT pleural effusion is present. Hepatobiliary: Cirrhosis is again identified. Unchanged 2.5 cm posterior RIGHT hepatic mass (image 12: Series 4) does not appear significantly changed. Patient is status post cholecystectomy. Increasing CBD dilatation to the ampulla  noted without obstructing cause identified on this study. The CBD now measures up to 3 cm in greatest diameter. Pancreas: Unremarkable Spleen: UPPER limits normal spleen size again noted. Adrenals/Urinary Tract: The kidneys, adrenal glands and bladder are unremarkable except for unchanged nonobstructing RIGHT renal calculi. Stomach/Bowel: The appendix is now enlarged with possible adjacent inflammation, suspicious for acute appendicitis. No other bowel abnormalities are noted. There is no evidence of bowel obstruction. Vascular/Lymphatic: Recanalized umbilical vein noted. There is no evidence of abdominal aortic aneurysm. No abnormal lymph nodes are identified. Reproductive: Prostate is unremarkable. Other: Edema within the subcutaneous tissues and mesentery/abdominal fat noted. A trace amount of ascites noted within the pelvis. There is no evidence of focal collection or pneumoperitoneum. Musculoskeletal: No acute or suspicious bony abnormalities are noted. IMPRESSION: 1. Enlarged appendix with possible adjacent inflammation, suspicious for acute appendicitis. No evidence of focal collection, adjacent fluid or pneumoperitoneum. 2. Increasing CBD dilatation to the ampulla without obstructing cause identified on this study. Consider MRCP/ERCP. 3. Cirrhosis and portal hypertension with recanalized umbilical vein and trace amount of ascites. Unchanged 2.5 cm posterior RIGHT hepatic mass suspicious for hepatocellular carcinoma as noted on prior abdominal  MRI. 4. Trace RIGHT pleural effusion. 5. Nonobstructing RIGHT renal calculi. Electronically Signed   By: Margarette Canada M.D.   On: 11/05/2022 16:58   DG Ribs Unilateral W/Chest Left  Result Date: 10/17/2022 CLINICAL DATA:  fall, rib pain EXAM: LEFT RIBS AND CHEST - 3+ VIEW COMPARISON:  09/13/2022. FINDINGS: No fracture or other bone lesions are seen involving the ribs. There is no evidence of pneumothorax or pleural effusion. Both lungs are clear. Heart size and mediastinal contours are within normal limits. IMPRESSION: Negative. Electronically Signed   By: Margaretha Sheffield M.D.   On: 10/17/2022 18:33    Microbiology: Results for orders placed or performed during the hospital encounter of 11/05/22  Resp panel by RT-PCR (RSV, Flu A&B, Covid) Anterior Nasal Swab     Status: None   Collection Time: 11/05/22  3:25 PM   Specimen: Anterior Nasal Swab  Result Value Ref Range Status   SARS Coronavirus 2 by RT PCR NEGATIVE NEGATIVE Final    Comment: (NOTE) SARS-CoV-2 target nucleic acids are NOT DETECTED.  The SARS-CoV-2 RNA is generally detectable in upper respiratory specimens during the acute phase of infection. The lowest concentration of SARS-CoV-2 viral copies this assay can detect is 138 copies/mL. A negative result does not preclude SARS-Cov-2 infection and should not be used as the sole basis for treatment or other patient management decisions. A negative result may occur with  improper specimen collection/handling, submission of specimen other than nasopharyngeal swab, presence of viral mutation(s) within the areas targeted by this assay, and inadequate number of viral copies(<138 copies/mL). A negative result must be combined with clinical observations, patient history, and epidemiological information. The expected result is Negative.  Fact Sheet for Patients:  EntrepreneurPulse.com.au  Fact Sheet for Healthcare Providers:   IncredibleEmployment.be  This test is no t yet approved or cleared by the [place] FDA and  has been authorized for detection and/or diagnosis of SARS-CoV-2 by FDA under an Emergency Use Authorization (EUA). This EUA will remain  in effect (meaning this test can be used) for the duration of the COVID-19 declaration under Section 564(b)(1) of the Act, 21 U.S.C.section 360bbb-3(b)(1), unless the authorization is terminated  or revoked sooner.       Influenza A by PCR NEGATIVE NEGATIVE Final   Influenza B by PCR NEGATIVE NEGATIVE Final  Comment: (NOTE) The Xpert Xpress SARS-CoV-2/FLU/RSV plus assay is intended as an aid in the diagnosis of influenza from Nasopharyngeal swab specimens and should not be used as a sole basis for treatment. Nasal washings and aspirates are unacceptable for Xpert Xpress SARS-CoV-2/FLU/RSV testing.  Fact Sheet for Patients: EntrepreneurPulse.com.au  Fact Sheet for Healthcare Providers: IncredibleEmployment.be  This test is not yet approved or cleared by the [place] FDA and has been authorized for detection and/or diagnosis of SARS-CoV-2 by FDA under an Emergency Use Authorization (EUA). This EUA will remain in effect (meaning this test can be used) for the duration of the COVID-19 declaration under Section 564(b)(1) of the Act, 21 U.S.C. section 360bbb-3(b)(1), unless the authorization is terminated or revoked.     Resp Syncytial Virus by PCR NEGATIVE NEGATIVE Final    Comment: (NOTE) Fact Sheet for Patients: EntrepreneurPulse.com.au  Fact Sheet for Healthcare Providers: IncredibleEmployment.be  This test is not yet approved or cleared by the [place] FDA and has been authorized for detection and/or diagnosis of SARS-CoV-2 by FDA under an Emergency Use Authorization (EUA). This EUA will remain in effect (meaning this test can be used) for  the duration of the COVID-19 declaration under Section 564(b)(1) of the Act, 21 U.S.C. section 360bbb-3(b)(1), unless the authorization is terminated or revoked.  Performed at [facility], [address] [place], [place] [address]     Labs: CBC: Recent Labs  Lab 11/05/22 1127 11/05/22 1911 11/06/22 0455  WBC 2.7* 2.5* 3.0*  NEUTROABS  --  1.1*  --   HGB 10.4* 10.3* 10.8*  HCT 32.0* 31.0* 32.9*  MCV 99.4 98.4 99.4  PLT 98* 100* 82*   Basic Metabolic Panel: Recent Labs  Lab 11/05/22 1127 11/05/22 1915 11/06/22 0455 11/07/22 0526  NA 138  --  136 133*  K 4.0  --  4.0 4.7  CL 110  --  106 106  CO2 23  --  21* 22  GLUCOSE 89  --  163* 140*  BUN 14  --  15 26*  CREATININE 0.88  --  1.02 1.23  CALCIUM 9.0  --  8.7* 8.5*  MG  --  1.6*  --  2.1  PHOS  --   --   --  3.3   Liver Function Tests: Recent Labs  Lab 11/05/22 1127 11/06/22 0455  AST 38 37  ALT 23 23  ALKPHOS 119 124  BILITOT 2.0* 1.8*  PROT 5.8* 6.0*  ALBUMIN 2.2* 2.2*   CBG: Recent Labs  Lab 11/07/22 0747 11/07/22 1129 11/07/22 1628 11/07/22 2111 11/08/22 0750  GLUCAP 132* 177* 143* 241* 105*    Discharge time spent: greater than 30 minutes.  Signed: Collier Bullock, MD [facility] 11/08/2022

## 2022-11-08 NOTE — Progress Notes (Signed)
Patient ID: [name], male   DOB: Feb 21, 1951, 72 y.o.   MRN: [id number]     [facility] Day(s): 3.   Interval History: Patient seen and examined, no acute events or new complaints overnight. Patient reports continued having pain in surgical area.  He is tolerating diet.  No nausea or vomiting.  No fever.  Stable vital sign.  Vital signs in last 24 hours: [min-max] current  Temp:  [97.6 F (36.4 C)-98.3 F (36.8 C)] 98.3 F (36.8 C) (03/14 0750) Pulse Rate:  [52-56] 55 (03/14 0750) Resp:  [18-20] 18 (03/14 0750) BP: (93-115)/(52-64) 115/64 (03/14 0750) SpO2:  [95 %-99 %] 99 % (03/14 0750) Weight:  [120 kg] 120 kg (03/14 0448)       Weight: 120 kg BMI (Calculated): 42.72   Physical Exam:  Constitutional: alert, cooperative and no distress  Respiratory: breathing non-labored at rest  Cardiovascular: regular rate and sinus rhythm  Gastrointestinal: soft, non-tender, and non-distended  Labs:     Latest Ref Rng & Units 11/06/2022    4:55 AM 11/05/2022    7:11 PM 11/05/2022   11:27 AM  CBC  WBC 4.0 - 10.5 K/uL 3.0  2.5  2.7   Hemoglobin 13.0 - 17.0 g/dL 10.8  10.3  10.4   Hematocrit 39.0 - 52.0 % 32.9  31.0  32.0   Platelets 150 - 400 K/uL 82  100  98       Latest Ref Rng & Units 11/07/2022    5:26 AM 11/06/2022    4:55 AM 11/05/2022   11:27 AM  CMP  Glucose 70 - 99 mg/dL 140  163  89   BUN 8 - 23 mg/dL '26  15  14   '$ Creatinine 0.61 - 1.24 mg/dL 1.23  1.02  0.88   Sodium 135 - 145 mmol/L 133  136  138   Potassium 3.5 - 5.1 mmol/L 4.7  4.0  4.0   Chloride 98 - 111 mmol/L 106  106  110   CO2 22 - 32 mmol/L '22  21  23   '$ Calcium 8.9 - 10.3 mg/dL 8.5  8.7  9.0   Total Protein 6.5 - 8.1 g/dL  6.0  5.8   Total Bilirubin 0.3 - 1.2 mg/dL  1.8  2.0   Alkaline Phos 38 - 126 U/L  124  119   AST 15 - 41 U/L  37  38   ALT 0 - 44 U/L  23  23     Imaging studies: No new pertinent imaging studies   Assessment/Plan:  72 y.o. male with acute appendicitis 3 Days  Post-Op s/p robotic assisted laparoscopic appendectomy.   -Patient with stable vital signs, no fever -He is tolerating diet -No sign of complication from surgical standpoint.  No contraindication for discharge.  I will follow him in 2 weeks in my office.  Arnold Long, MD

## 2022-11-08 NOTE — Progress Notes (Signed)
     [facility] REFERRAL        Occupational Therapy * Physical Therapy * Speech Therapy                           DATE 11/08/22   PATIENT NAME [name] PATIENT MRN [id number]       DIAGNOSIS/DIAGNOSIS CODE appendicitis   DATE OF DISCHARGE: 11/08/22        PRIMARY CARE PHYSICIAN    Denton Lank    PCP PHONE/FAX [phone]      Dear Provider (Name: [facility] outpatient __  Fax: [phone]   I certify that I have examined this patient and that occupational/physical/speech therapy is necessary on an outpatient basis.    The patient has expressed interest in completing their recommended course of therapy at your  location.  Once a formal order from the patient's primary care physician has been obtained, please  contact him/her to schedule an appointment for evaluation at your earliest convenience.   [x ]  Physical Therapy Evaluate and Treat  [  ]  Occupational Therapy Evaluate and Treat  [  ]  Speech Therapy Evaluate and Treat         The patient's primary care physician (listed above) must furnish and be responsible for a formal order such that the recommended services may be furnished while under the primary physician's care, and that the plan of care will be established and reviewed every 30 days (or more often if condition necessitates).

## 2022-11-10 ENCOUNTER — Emergency Department: Payer: 59

## 2022-11-10 ENCOUNTER — Emergency Department
Admission: EM | Admit: 2022-11-10 | Discharge: 2022-11-10 | Disposition: A | Discharge status: Home / Self Care | Payer: 59 | Attending: Student in an Organized Health Care Education/Training Program | Admitting: Student in an Organized Health Care Education/Training Program

## 2022-11-10 ENCOUNTER — Other Ambulatory Visit: Payer: Self-pay

## 2022-11-10 DIAGNOSIS — R109 Unspecified abdominal pain: Secondary | ICD-10-CM | POA: Diagnosis present

## 2022-11-10 DIAGNOSIS — Z1152 Encounter for screening for COVID-19: Secondary | ICD-10-CM | POA: Insufficient documentation

## 2022-11-10 DIAGNOSIS — E860 Dehydration: Secondary | ICD-10-CM | POA: Insufficient documentation

## 2022-11-10 DIAGNOSIS — S301XXA Contusion of abdominal wall, initial encounter: Secondary | ICD-10-CM | POA: Insufficient documentation

## 2022-11-10 DIAGNOSIS — X58XXXA Exposure to other specified factors, initial encounter: Secondary | ICD-10-CM | POA: Insufficient documentation

## 2022-11-10 LAB — URINALYSIS, ROUTINE W REFLEX MICROSCOPIC
Bilirubin Urine: NEGATIVE
Glucose, UA: NEGATIVE mg/dL
Hgb urine dipstick: NEGATIVE
Ketones, ur: NEGATIVE mg/dL
Leukocytes,Ua: NEGATIVE
Nitrite: NEGATIVE
Protein, ur: NEGATIVE mg/dL
Specific Gravity, Urine: 1.029 (ref 1.005–1.030)
pH: 6 (ref 5.0–8.0)

## 2022-11-10 LAB — COMPREHENSIVE METABOLIC PANEL
ALT: 18 U/L (ref 0–44)
AST: 34 U/L (ref 15–41)
Albumin: 2 g/dL — ABNORMAL LOW (ref 3.5–5.0)
Alkaline Phosphatase: 93 U/L (ref 38–126)
Anion gap: 1 — ABNORMAL LOW (ref 5–15)
BUN: 28 mg/dL — ABNORMAL HIGH (ref 8–23)
CO2: 25 mmol/L (ref 22–32)
Calcium: 8.5 mg/dL — ABNORMAL LOW (ref 8.9–10.3)
Chloride: 107 mmol/L (ref 98–111)
Creatinine, Ser: 1.04 mg/dL (ref 0.61–1.24)
GFR, Estimated: >60 mL/min (ref >60)
Glucose, Bld: 170 mg/dL — ABNORMAL HIGH (ref 70–99)
Potassium: 4.1 mmol/L (ref 3.5–5.1)
Sodium: 133 mmol/L — ABNORMAL LOW (ref 135–145)
Total Bilirubin: 1.4 mg/dL — ABNORMAL HIGH (ref 0.3–1.2)
Total Protein: 5.6 g/dL — ABNORMAL LOW (ref 6.5–8.1)

## 2022-11-10 LAB — AMMONIA: Ammonia: 60 umol/L — ABNORMAL HIGH (ref 9–35)

## 2022-11-10 LAB — CBC WITH DIFFERENTIAL/PLATELET
Abs Immature Granulocytes: 0.01 10*3/uL (ref 0.00–0.07)
Basophils Absolute: 0 10*3/uL (ref 0.0–0.1)
Basophils Relative: 2 %
Eosinophils Absolute: 0.1 10*3/uL (ref 0.0–0.5)
Eosinophils Relative: 5 %
HCT: 33.2 % — ABNORMAL LOW (ref 39.0–52.0)
Hemoglobin: 10.9 g/dL — ABNORMAL LOW (ref 13.0–17.0)
Immature Granulocytes: 0 %
Lymphocytes Relative: 23 %
Lymphs Abs: 0.6 10*3/uL — ABNORMAL LOW (ref 0.7–4.0)
MCH: 32.7 pg (ref 26.0–34.0)
MCHC: 32.8 g/dL (ref 30.0–36.0)
MCV: 99.7 fL (ref 80.0–100.0)
Monocytes Absolute: 0.3 10*3/uL (ref 0.1–1.0)
Monocytes Relative: 12 %
Neutro Abs: 1.6 10*3/uL — ABNORMAL LOW (ref 1.7–7.7)
Neutrophils Relative %: 58 %
Platelets: 111 10*3/uL — ABNORMAL LOW (ref 150–400)
RBC: 3.33 MIL/uL — ABNORMAL LOW (ref 4.22–5.81)
RDW: 15 % (ref 11.5–15.5)
WBC: 2.6 10*3/uL — ABNORMAL LOW (ref 4.0–10.5)
nRBC: 0 % (ref 0.0–0.2)

## 2022-11-10 LAB — LACTIC ACID, PLASMA
Lactic Acid, Venous: 2.6 mmol/L (ref 0.5–1.9)
Lactic Acid, Venous: 2.1 mmol/L (ref 0.5–1.9)

## 2022-11-10 LAB — RESP PANEL BY RT-PCR (RSV, FLU A&B, COVID)  RVPGX2
Influenza A by PCR: NEGATIVE
Influenza B by PCR: NEGATIVE
Resp Syncytial Virus by PCR: NEGATIVE
SARS Coronavirus 2 by RT PCR: NEGATIVE

## 2022-11-10 MED ORDER — SODIUM CHLORIDE 0.9 % IV BOLUS
1000.0000 mL | Freq: Once | INTRAVENOUS | Status: AC
  Administered 2022-11-10: 1000 mL via INTRAVENOUS

## 2022-11-10 MED ORDER — MORPHINE SULFATE (PF) 4 MG/ML IV SOLN
4.0000 mg | INTRAVENOUS | Status: DC | PRN
  Administered 2022-11-10: 4 mg via INTRAVENOUS
  Filled 2022-11-10: qty 1

## 2022-11-10 MED ORDER — IOHEXOL 350 MG/ML SOLN
100.0000 mL | Freq: Once | INTRAVENOUS | Status: AC | PRN
  Administered 2022-11-10: 100 mL via INTRAVENOUS

## 2022-11-10 MED ORDER — OXYCODONE HCL 5 MG PO TABS: 5.0000 mg | ORAL_TABLET | Freq: Three times a day (TID) | ORAL | 0 refills | Status: DC | PRN

## 2022-11-10 MED ORDER — LACTULOSE 10 GM/15ML PO SOLN
30.0000 g | Freq: Once | ORAL | Status: AC
  Administered 2022-11-10: 30 g via ORAL
  Filled 2022-11-10: qty 60

## 2022-11-10 MED ORDER — ONDANSETRON HCL 4 MG/2ML IJ SOLN
4.0000 mg | Freq: Once | INTRAMUSCULAR | Status: AC
  Administered 2022-11-10: 4 mg via INTRAVENOUS
  Filled 2022-11-10: qty 2

## 2022-11-10 MED ORDER — OXYCODONE HCL 5 MG PO TABS: 5.0000 mg | ORAL_TABLET | ORAL | Status: DC | PRN

## 2022-11-10 NOTE — ED Notes (Signed)
Pt soiled with diarrhea. Pt cleaned by this RN. Bed linens changed. Pt placed in new gown and socks. Warm blankets provided. Call bell within reach.

## 2022-11-10 NOTE — ED Triage Notes (Signed)
Pt to ED via [facility] from [facility] parking lot. Pt seen here recently for emergent appencectomy. C/O N/V/D and abd pain rated at 10. Pt states he was discharged with oxycodone and took his last pill at 3 am this morning with no relief from the pain.   Temp 100.1 oral HR 60 (%% RA 122/159 Glucose 190

## 2022-11-10 NOTE — ED Provider Notes (Signed)
[facility] Provider Note    Event Date/Time   First MD Initiated Contact with Patient 11/10/22 1456     (approximate)   History   Abdominal Pain   HPI  [name] is a 72 y.o. male presents the ER for evaluation of worsening abdominal pain status post appendectomy few days ago not currently on any antibiotics.  States having profuse brown diarrhea having trouble staying hydrated.  He denies any chest pain or shortness of breath.  Feeling very weak from all the diarrhea and thinks that a lot of it could be related to the lactulose that he is being prescribed.  Denies any melena no hematochezia.  Having crampy abdominal pain.     Physical Exam   Triage Vital Signs: ED Triage Vitals  Enc Vitals Group     BP 11/10/22 1445 (!) 107/48     Pulse Rate 11/10/22 1445 (!) 52     Resp 11/10/22 1445 19     Temp 11/10/22 1437 99.2 F (37.3 C)     Temp Source 11/10/22 1437 Oral     SpO2 11/10/22 1445 94 %     Weight --      Height --      Head Circumference --      Peak Flow --      Pain Score 11/10/22 1435 10     Pain Loc --      Pain Edu? --      Excl. in [place]? --     Most recent vital signs: Vitals:   11/10/22 1941 11/10/22 2000  BP: (!) 112/55 117/70  Pulse: (!) 52 (!) 49  Resp: 17 17  Temp:    SpO2: 96% 100%     Constitutional: Alert, chronically ill appearing, no distress Eyes: Conjunctivae are normal.  Head: Atraumatic. Nose: No congestion/rhinnorhea. Mouth/Throat: Mucous membranes are moist.   Neck: Painless ROM.  Cardiovascular:   Good peripheral circulation. Respiratory: Normal respiratory effort.  No retractions.  Gastrointestinal: Soft, no fluid wave, some bruising to the left abdominal wall.  No overlying cellulitis or fluctuance.Musculoskeletal:  no deformity Neurologic:  MAE spontaneously. No gross focal neurologic deficits are appreciated.  Skin:  Skin is warm, dry and intact. No rash noted. Psychiatric: Mood and affect are  normal. Speech and behavior are normal.    ED Results / Procedures / Treatments   Labs (all labs ordered are listed, but only abnormal results are displayed) Labs Reviewed  CBC WITH DIFFERENTIAL/PLATELET - Abnormal; Notable for the following components:      Result Value   WBC 2.6 (*)    RBC 3.33 (*)    Hemoglobin 10.9 (*)    HCT 33.2 (*)    Platelets 111 (*)    Neutro Abs 1.6 (*)    Lymphs Abs 0.6 (*)    All other components within normal limits  COMPREHENSIVE METABOLIC PANEL - Abnormal; Notable for the following components:   Sodium 133 (*)    Glucose, Bld 170 (*)    BUN 28 (*)    Calcium 8.5 (*)    Total Protein 5.6 (*)    Albumin 2.0 (*)    Total Bilirubin 1.4 (*)    Anion gap 1 (*)    All other components within normal limits  LACTIC ACID, PLASMA - Abnormal; Notable for the following components:   Lactic Acid, Venous 2.6 (*)    All other components within normal limits  LACTIC ACID, PLASMA - Abnormal; Notable  for the following components:   Lactic Acid, Venous 2.1 (*)    All other components within normal limits  AMMONIA - Abnormal; Notable for the following components:   Ammonia 60 (*)    All other components within normal limits  URINALYSIS, ROUTINE W REFLEX MICROSCOPIC - Abnormal; Notable for the following components:   Color, Urine YELLOW (*)    APPearance CLEAR (*)    All other components within normal limits  RESP PANEL BY RT-PCR (RSV, FLU A&B, COVID)  RVPGX2  CULTURE, BLOOD (ROUTINE X 2)  CULTURE, BLOOD (ROUTINE X 2)     EKG  ED ECG REPORT I, Merlyn Lot, the attending physician, personally viewed and interpreted this ECG.   Date: 11/10/2022  EKG Time: 19:59  Rate: 50  Rhythm: sinus  Axis: normal  Intervals: normal  ST&T Change: no stemi, no depression    RADIOLOGY Please see ED Course for my review and interpretation.  I personally reviewed all radiographic images ordered to evaluate for the above acute complaints and reviewed  radiology reports and findings.  These findings were personally discussed with the patient.  Please see medical record for radiology report.    PROCEDURES:  Critical Care performed: No  Procedures   MEDICATIONS ORDERED IN ED: Medications  morphine (PF) 4 MG/ML injection 4 mg (4 mg Intravenous Given 11/10/22 1711)  sodium chloride 0.9 % bolus 1,000 mL (0 mLs Intravenous Stopped 11/10/22 1749)  ondansetron (ZOFRAN) injection 4 mg (4 mg Intravenous Given 11/10/22 1543)  iohexol (OMNIPAQUE) 350 MG/ML injection 100 mL (100 mLs Intravenous Contrast Given 11/10/22 1618)  lactulose (CHRONULAC) 10 GM/15ML solution 30 g (30 g Oral Given 11/10/22 1942)     IMPRESSION / MDM / ASSESSMENT AND PLAN / ED COURSE  I reviewed the triage vital signs and the nursing notes.                              Differential diagnosis includes, but is not limited to, dehydration, postop complication, abscess, sepsis, UTI, SBP, anasarca, colitis  Patient presenting to the ER for evaluation of symptoms as described above.  Based on symptoms, risk factors and considered above differential, this presenting complaint could reflect a potentially life-threatening illness therefore the patient will be placed on continuous pulse oximetry and telemetry for monitoring.  Laboratory evaluation will be sent to evaluate for the above complaints.      Clinical Course as of 11/10/22 2050  Sat Nov 10, 2022  1530 Patient's lactate is mildly elevated, is receiving IV fluids.  Will order CT abdomen pelvis. [PR]  [id number] CT abdomen on my review and interpretation without any evidence of ascites or obstruction.  No acute findings per radiology.  Chest x-ray on my review and interpretation without evidence of consolidation. [PR]  1846 Repeat lactate significantly improved.  No asterixis or findings of hepatic encephalopathy on exam.  Has not had any significant additional diarrhea since being here.  His abdominal exam is benign and appropriate  in the postop state. [PR]  2044 Patient reassessed he is clinically very well-appearing tolerating p.o.  At this point I do not believe that he requires admission to the hospital.  Does not meet sepsis criteria.  No sign of postop complication or infection.  Does appear appropriate for outpatient follow-up. [PR]    Clinical Course User Index [PR] Merlyn Lot, MD     FINAL CLINICAL IMPRESSION(S) / ED DIAGNOSES   Final diagnoses:  Abdominal pain, unspecified abdominal location  Dehydration     Rx / DC Orders   ED Discharge Orders          Ordered    oxyCODONE (ROXICODONE) 5 MG immediate release tablet  Every 8 hours PRN        11/10/22 2047             Note:  This document was prepared using Dragon voice recognition software and may include unintentional dictation errors.    Merlyn Lot, MD 11/10/22 2051

## 2022-11-11 ENCOUNTER — Telehealth: Payer: Self-pay | Admitting: Emergency Medicine

## 2022-11-11 LAB — BLOOD CULTURE ID PANEL (REFLEXED) - BCID2

## 2022-11-11 NOTE — Telephone Encounter (Signed)
This RN called pt in regards to positive blood culture result. Consulted Dr. Jacqualine Code, EDP who states that it is most likely skin contaminate. Recommended to call pt to see how he was doing. Spoke with pt via telephone, pt denies fever and states he is doing okay. Explained results and likely that it was skin contaminate. Explained that if he felt any worse he could come back to the ED to be evaluated. Pt verbalized understanding.

## 2022-11-13 LAB — CULTURE, BLOOD (ROUTINE X 2): Special Requests: ADEQUATE

## 2022-11-14 NOTE — Congregational Nurse Program (Signed)
  Dept: [phone]   Congregational Nurse Program Note  Date of Encounter: 11/14/2022 Client to [facility] day center with request for assistance in making a hospital follow up appointment at his PCP, [facility]. Client does not currently have a telephone. Apt made for 3/22 at 10 am. Client was recently discharged from [facility] following an appendectomy. He reports no issues and "feels much better". No other needs at this time. Ronn Melena BSN, RN Past Medical History: Past Medical History:  Diagnosis Date   Asthma    CHF (congestive heart failure) ([place])    COPD (chronic obstructive pulmonary disease) ([place])    COVID-19 03/2020   diagnosed in August 2021   Diabetes mellitus without complication ([place])    Homelessness    Hypertension    Infestation by bed bug    Migraine    Obesity    Sleep apnea     Encounter Details:  CNP Questionnaire - 11/14/22 1145       Questionnaire   Ask client: Do you give verbal consent for me to treat you today? Yes    Student Assistance N/A    Location Patient [name]    Visit Setting with Client Organization    Patient Status Unhoused   client [place] Medicaid;Medicare    Insurance/Financial Assistance Referral N/A    Medication N/A    Medical Provider Yes   [facility]   Screening Referrals Made N/A    Medical Referrals Made N/A    Medical Appointment Made Non-[facility] PCP/clinic   assisted client with making apt at [facility] for 3/22 at 10 am   Recently w/o PCP, now 1st time PCP visit completed due to CNs referral or appointment made [place] [facility]    Transportation N/A   client reports having Medicare transportation.   Housing/Utilities No permanent housing    Interpersonal Safety N/A    Interventions Advocate/Support;Navigate Healthcare System   client reported being in the ED, was seen on 11/18   Abnormal to  Normal Screening Since Last CN Visit N/A    Screenings CN Performed N/A    Sent Client to Lab for: N/A    Did client attend any of the following based off CNs referral or appointments made? N/A    ED Visit Averted N/A    Life-Saving Intervention Made N/A

## 2022-11-15 ENCOUNTER — Emergency Department: Payer: 59

## 2022-11-15 ENCOUNTER — Encounter: Payer: Self-pay | Admitting: Radiology

## 2022-11-15 ENCOUNTER — Emergency Department
Admission: EM | Admit: 2022-11-15 | Discharge: 2022-11-16 | Disposition: A | Discharge status: Home / Self Care | Payer: 59 | Attending: Emergency Medicine | Admitting: Emergency Medicine

## 2022-11-15 DIAGNOSIS — R109 Unspecified abdominal pain: Secondary | ICD-10-CM | POA: Diagnosis present

## 2022-11-15 DIAGNOSIS — R1084 Generalized abdominal pain: Secondary | ICD-10-CM | POA: Diagnosis not present

## 2022-11-15 LAB — COMPREHENSIVE METABOLIC PANEL
ALT: 41 U/L (ref 0–44)
AST: 85 U/L — ABNORMAL HIGH (ref 15–41)
Albumin: 2.5 g/dL — ABNORMAL LOW (ref 3.5–5.0)
Alkaline Phosphatase: 193 U/L — ABNORMAL HIGH (ref 38–126)
Anion gap: 2 — ABNORMAL LOW (ref 5–15)
BUN: 18 mg/dL (ref 8–23)
CO2: 26 mmol/L (ref 22–32)
Calcium: 8.8 mg/dL — ABNORMAL LOW (ref 8.9–10.3)
Chloride: 109 mmol/L (ref 98–111)
Creatinine, Ser: 1.06 mg/dL (ref 0.61–1.24)
GFR, Estimated: >60 mL/min (ref >60)
Glucose, Bld: 112 mg/dL — ABNORMAL HIGH (ref 70–99)
Potassium: 3.9 mmol/L (ref 3.5–5.1)
Sodium: 137 mmol/L (ref 135–145)
Total Bilirubin: 1.5 mg/dL — ABNORMAL HIGH (ref 0.3–1.2)
Total Protein: 6.2 g/dL — ABNORMAL LOW (ref 6.5–8.1)

## 2022-11-15 LAB — CBC
HCT: 32 % — ABNORMAL LOW (ref 39.0–52.0)
Hemoglobin: 10.5 g/dL — ABNORMAL LOW (ref 13.0–17.0)
MCH: 32.5 pg (ref 26.0–34.0)
MCHC: 32.8 g/dL (ref 30.0–36.0)
MCV: 99.1 fL (ref 80.0–100.0)
Platelets: 108 10*3/uL — ABNORMAL LOW (ref 150–400)
RBC: 3.23 MIL/uL — ABNORMAL LOW (ref 4.22–5.81)
RDW: 14.8 % (ref 11.5–15.5)
WBC: 3 10*3/uL — ABNORMAL LOW (ref 4.0–10.5)
nRBC: 0 % (ref 0.0–0.2)

## 2022-11-15 LAB — URINALYSIS, ROUTINE W REFLEX MICROSCOPIC
Bilirubin Urine: NEGATIVE
Glucose, UA: NEGATIVE mg/dL
Hgb urine dipstick: NEGATIVE
Ketones, ur: NEGATIVE mg/dL
Leukocytes,Ua: NEGATIVE
Nitrite: NEGATIVE
Protein, ur: NEGATIVE mg/dL
Specific Gravity, Urine: 1.01 (ref 1.005–1.030)
pH: 6 (ref 5.0–8.0)

## 2022-11-15 LAB — LIPASE, BLOOD: Lipase: 31 U/L (ref 11–51)

## 2022-11-15 LAB — CBG MONITORING, ED: Glucose-Capillary: 63 mg/dL — ABNORMAL LOW (ref 70–99)

## 2022-11-15 MED ORDER — FENTANYL CITRATE PF 50 MCG/ML IJ SOSY
50.0000 ug | PREFILLED_SYRINGE | Freq: Once | INTRAMUSCULAR | Status: DC
  Filled 2022-11-15: qty 1

## 2022-11-15 MED ORDER — SODIUM CHLORIDE 0.9 % IV BOLUS
500.0000 mL | Freq: Once | INTRAVENOUS | Status: AC
  Administered 2022-11-15: 500 mL via INTRAVENOUS

## 2022-11-15 MED ORDER — IOHEXOL 300 MG/ML  SOLN
100.0000 mL | Freq: Once | INTRAMUSCULAR | Status: AC | PRN
  Administered 2022-11-15: 100 mL via INTRAVENOUS

## 2022-11-15 MED ORDER — MORPHINE SULFATE (PF) 4 MG/ML IV SOLN
4.0000 mg | Freq: Once | INTRAVENOUS | Status: AC
  Administered 2022-11-15: 4 mg via INTRAVENOUS
  Filled 2022-11-15: qty 1

## 2022-11-15 NOTE — ED Triage Notes (Addendum)
Pt sts that he has been having abd pain. Pt was seen here back on the 03/11 of this month for a appendicitis which he sts he did get removed. Pt is homeless. Pt sts that this abd pain and  gross swelling started last night.

## 2022-11-15 NOTE — Discharge Instructions (Signed)
You are seen in the emergency department with abdominal pain.  You had a CT scan that did not show any acute findings.  Follow-up closely with your surgeon and with your primary care physician.  Return to the emergency department if your symptoms worsen.  Pain control: You can take Motrin 600 mg every 6 hours as needed for pain.

## 2022-11-15 NOTE — ED Notes (Signed)
BGL was 63, given lunch tray and gingerale. Provider notified

## 2022-11-15 NOTE — ED Provider Notes (Signed)
[facility] Provider Note    Event Date/Time   First MD Initiated Contact with Patient 11/15/22 1732     (approximate)   History   Abdominal Pain   HPI  [name] is a 72 y.o. male who presents to the emergency department with abdominal pain.  States that he has had worsening left-sided abdominal pain that is worse when compared to his prior.  States that he had a recent appendectomy and now is having pain that is worse between the stitches.  Denies any nausea or vomiting.  Denies any falls or trauma.  States that his pain is severe.     Physical Exam   Triage Vital Signs: ED Triage Vitals  Enc Vitals Group     BP 11/15/22 1716 118/60     Pulse Rate 11/15/22 1716 (!) 52     Resp 11/15/22 1716 18     Temp 11/15/22 1716 98.7 F (37.1 C)     Temp Source 11/15/22 1716 Oral     SpO2 11/15/22 1716 92 %     Weight 11/15/22 1717 205 lb (93 kg)     Height --      Head Circumference --      Peak Flow --      Pain Score 11/15/22 1717 10     Pain Loc --      Pain Edu? --      Excl. in [place]? --     Most recent vital signs: Vitals:   11/15/22 1858 11/16/22 0003  BP: (!) 175/74 (!) 149/72  Pulse: (!) 56 (!) 50  Resp: 14 18  Temp:    SpO2: 94% 96%    Physical Exam Constitutional:      Appearance: He is well-developed.  HENT:     Head: Atraumatic.  Eyes:     Conjunctiva/sclera: Conjunctivae normal.  Cardiovascular:     Rate and Rhythm: Regular rhythm.  Pulmonary:     Effort: No respiratory distress.  Abdominal:     Tenderness: There is generalized abdominal tenderness.     Comments: Well-healing abdominal scars that are clean and dry with no surrounding erythema warmth or induration.  Diffuse tenderness to palpation that is worse in the left lower quadrant.  Musculoskeletal:     Cervical back: Normal range of motion.  Skin:    General: Skin is warm.     Capillary Refill: Capillary refill takes less than 2 seconds.  Neurological:      General: No focal deficit present.     Mental Status: He is alert. Mental status is at baseline.  Psychiatric:        Mood and Affect: Mood normal.     IMPRESSION / MDM / [place] / ED COURSE  I reviewed the triage vital signs and the nursing notes.  On chart review patient had appendectomy recently.  Patient was seen a couple of days ago and had abdominal pain.  Had a repeat CT scan that showed no acute findings.  Ultimately was able to be discharged home.  Differential diagnosis including perforation, abscess, bowel obstruction, electrolyte abnormality, dehydration.  RADIOLOGY I independently reviewed imaging, my interpretation of imaging: CT abdomen and pelvis with no acute intra-abdominal pathology.  Read as no acute findings.  Multiple incidental findings.  Anasarca with body wall edema but no signs of ascites.  LABS (all labs ordered are listed, but only abnormal results are displayed) Labs interpreted as -    Labs Reviewed  COMPREHENSIVE METABOLIC PANEL - Abnormal; Notable for the following components:      Result Value   Glucose, Bld 112 (*)    Calcium 8.8 (*)    Total Protein 6.2 (*)    Albumin 2.5 (*)    AST 85 (*)    Alkaline Phosphatase 193 (*)    Total Bilirubin 1.5 (*)    Anion gap 2 (*)    All other components within normal limits  CBC - Abnormal; Notable for the following components:   WBC 3.0 (*)    RBC 3.23 (*)    Hemoglobin 10.5 (*)    HCT 32.0 (*)    Platelets 108 (*)    All other components within normal limits  CBG MONITORING, ED - Abnormal; Notable for the following components:   Glucose-Capillary 63 (*)    All other components within normal limits  LIPASE, BLOOD  URINALYSIS, ROUTINE W REFLEX MICROSCOPIC    TREATMENT  IV morphine, IV fluids  MDM   Clinical Course as of 11/16/22 0013  Thu Nov 15, 2022  1851 Significant extravasation into the left arm of contrast dye.  Patient neurovascularly intact.  Soft compartments.  Good  capillary refill. [SM]    Clinical Course User Index [SM] Nathaniel Man, MD   CT scan without acute findings.  Patient continues to complain of severe abdominal pain however patient able to ambulate in the emergency department.  Sleeping in the hallway.  No signs of abdominal wall cellulitis.  Lab work overall reassuring with no significant electrolyte abnormalities.  Patient's lab work appears to be at his baseline.  Concerned that he has ongoing pain from his prior surgeries.  Given return precautions and discussed close follow-up with primary care provider.  Repeat glucose was low, given p.o. and tolerated p.o. without any difficulties.  PROCEDURES:  Critical Care performed: No  Procedures  Patient's presentation is most consistent with acute presentation with potential threat to life or bodily function.   MEDICATIONS ORDERED IN ED: Medications  fentaNYL (SUBLIMAZE) injection 50 mcg (50 mcg Intravenous Not Given 11/15/22 1823)  iohexol (OMNIPAQUE) 300 MG/ML solution 100 mL (100 mLs Intravenous Contrast Given 11/15/22 1750)  sodium chloride 0.9 % bolus 500 mL (0 mLs Intravenous Stopped 11/15/22 1917)  morphine (PF) 4 MG/ML injection 4 mg (4 mg Intravenous Given 11/15/22 1823)    FINAL CLINICAL IMPRESSION(S) / ED DIAGNOSES   Final diagnoses:  Generalized abdominal pain     Rx / DC Orders   ED Discharge Orders     None        Note:  This document was prepared using Dragon voice recognition software and may include unintentional dictation errors.   Nathaniel Man, MD 11/16/22 [phone]

## 2022-11-16 LAB — CULTURE, BLOOD (ROUTINE X 2): Culture: NO GROWTH

## 2022-11-18 ENCOUNTER — Emergency Department
Admission: EM | Admit: 2022-11-18 | Discharge: 2022-11-18 | Disposition: A | Discharge status: Home / Self Care | Payer: 59 | Attending: Emergency Medicine | Admitting: Emergency Medicine

## 2022-11-18 ENCOUNTER — Other Ambulatory Visit: Payer: Self-pay

## 2022-11-18 ENCOUNTER — Emergency Department: Payer: 59

## 2022-11-18 ENCOUNTER — Encounter: Payer: Self-pay | Admitting: Emergency Medicine

## 2022-11-18 DIAGNOSIS — J449 Chronic obstructive pulmonary disease, unspecified: Secondary | ICD-10-CM | POA: Diagnosis not present

## 2022-11-18 DIAGNOSIS — I110 Hypertensive heart disease with heart failure: Secondary | ICD-10-CM | POA: Diagnosis not present

## 2022-11-18 DIAGNOSIS — R109 Unspecified abdominal pain: Secondary | ICD-10-CM | POA: Diagnosis present

## 2022-11-18 DIAGNOSIS — R42 Dizziness and giddiness: Secondary | ICD-10-CM | POA: Diagnosis not present

## 2022-11-18 DIAGNOSIS — J45909 Unspecified asthma, uncomplicated: Secondary | ICD-10-CM | POA: Diagnosis not present

## 2022-11-18 DIAGNOSIS — I509 Heart failure, unspecified: Secondary | ICD-10-CM | POA: Insufficient documentation

## 2022-11-18 DIAGNOSIS — Z5901 Sheltered homelessness: Secondary | ICD-10-CM | POA: Diagnosis not present

## 2022-11-18 DIAGNOSIS — E119 Type 2 diabetes mellitus without complications: Secondary | ICD-10-CM | POA: Insufficient documentation

## 2022-11-18 DIAGNOSIS — R1084 Generalized abdominal pain: Secondary | ICD-10-CM | POA: Insufficient documentation

## 2022-11-18 LAB — COMPREHENSIVE METABOLIC PANEL
ALT: 46 U/L — ABNORMAL HIGH (ref 0–44)
AST: 93 U/L — ABNORMAL HIGH (ref 15–41)
Albumin: 2.2 g/dL — ABNORMAL LOW (ref 3.5–5.0)
Alkaline Phosphatase: 218 U/L — ABNORMAL HIGH (ref 38–126)
Anion gap: 4 — ABNORMAL LOW (ref 5–15)
BUN: 14 mg/dL (ref 8–23)
CO2: 23 mmol/L (ref 22–32)
Calcium: 8.3 mg/dL — ABNORMAL LOW (ref 8.9–10.3)
Chloride: 108 mmol/L (ref 98–111)
Creatinine, Ser: 0.75 mg/dL (ref 0.61–1.24)
GFR, Estimated: >60 mL/min (ref >60)
Glucose, Bld: 90 mg/dL (ref 70–99)
Potassium: 3.3 mmol/L — ABNORMAL LOW (ref 3.5–5.1)
Sodium: 135 mmol/L (ref 135–145)
Total Bilirubin: 1.7 mg/dL — ABNORMAL HIGH (ref 0.3–1.2)
Total Protein: 6.1 g/dL — ABNORMAL LOW (ref 6.5–8.1)

## 2022-11-18 LAB — CBC
HCT: 32.1 % — ABNORMAL LOW (ref 39.0–52.0)
Hemoglobin: 10.6 g/dL — ABNORMAL LOW (ref 13.0–17.0)
MCH: 32.3 pg (ref 26.0–34.0)
MCHC: 33 g/dL (ref 30.0–36.0)
MCV: 97.9 fL (ref 80.0–100.0)
Platelets: 103 10*3/uL — ABNORMAL LOW (ref 150–400)
RBC: 3.28 MIL/uL — ABNORMAL LOW (ref 4.22–5.81)
RDW: 15 % (ref 11.5–15.5)
WBC: 4.5 10*3/uL (ref 4.0–10.5)
nRBC: 0 % (ref 0.0–0.2)

## 2022-11-18 LAB — LIPASE, BLOOD: Lipase: 32 U/L (ref 11–51)

## 2022-11-18 LAB — TROPONIN I (HIGH SENSITIVITY)
Troponin I (High Sensitivity): 12 ng/L (ref <18)
Troponin I (High Sensitivity): 13 ng/L (ref <18)

## 2022-11-18 MED ORDER — OXYCODONE HCL 5 MG PO TABS: 5.0000 mg | ORAL_TABLET | Freq: Three times a day (TID) | ORAL | 0 refills | Status: DC | PRN

## 2022-11-18 MED ORDER — MECLIZINE HCL 25 MG PO TABS: 12.5000 mg | ORAL_TABLET | Freq: Two times a day (BID) | ORAL | 0 refills | Status: DC | PRN

## 2022-11-18 MED ORDER — SODIUM CHLORIDE 0.9 % IV BOLUS
500.0000 mL | Freq: Once | INTRAVENOUS | Status: AC
  Administered 2022-11-18: 500 mL via INTRAVENOUS

## 2022-11-18 MED ORDER — MECLIZINE HCL 25 MG PO TABS
25.0000 mg | ORAL_TABLET | Freq: Once | ORAL | Status: AC
  Administered 2022-11-18: 25 mg via ORAL
  Filled 2022-11-18: qty 1

## 2022-11-18 MED ORDER — IOHEXOL 300 MG/ML  SOLN
100.0000 mL | Freq: Once | INTRAMUSCULAR | Status: AC | PRN
  Administered 2022-11-18: 100 mL via INTRAVENOUS

## 2022-11-18 MED ORDER — MORPHINE SULFATE (PF) 4 MG/ML IV SOLN
4.0000 mg | Freq: Once | INTRAVENOUS | Status: AC
  Administered 2022-11-18: 4 mg via INTRAVENOUS
  Filled 2022-11-18: qty 1

## 2022-11-18 NOTE — ED Provider Notes (Signed)
[facility] Provider Note    Event Date/Time   First MD Initiated Contact with Patient 11/18/22 2100     (approximate)   History   Abdominal Pain   HPI  [name] is a 72 y.o. male   Past medical history of CHF, COPD, asthma, diabetes, hypertension, recent appendectomy with several emergency department visits for ongoing abdominal pain, he is homeless and lives in a tent, who presents emergency department with abdominal pain unchanged since the surgery.  He also states that he has a mild headache and dizziness when he moves his head.  He denies fevers, chills, nausea, vomiting, GI bleeding.  No urinary symptoms.  He has no other acute medical complaints and is pain is unchanged in quality severity or location since his multiple prior emergency department visits.   External Medical Documents Reviewed: Emergency department visit dated November 15, 2022 with abdominal pain      Physical Exam   Triage Vital Signs: ED Triage Vitals  Enc Vitals Group     BP 11/18/22 2027 (!) 142/93     Pulse Rate 11/18/22 2027 (!) 53     Resp 11/18/22 2027 18     Temp 11/18/22 2027 98.4 F (36.9 C)     Temp Source 11/18/22 2027 Oral     SpO2 11/18/22 2027 98 %     Weight 11/18/22 2019 209 lb 7 oz (95 kg)     Height 11/18/22 2019 5\' 6"  (1.676 m)     Head Circumference --      Peak Flow --      Pain Score 11/18/22 2019 10     Pain Loc --      Pain Edu? --      Excl. in [place]? --     Most recent vital signs: Vitals:   11/18/22 2115 11/18/22 2300  BP: 131/65 134/63  Pulse: (!) 49   Resp: 11 17  Temp:    SpO2: 99%     General: Awake, no distress.  CV:  Good peripheral perfusion.  Resp:  Normal effort.  Abd:  No distention.  Other:  Surgical scars appear clean dry and intact, no overlying signs of infection he has some mild tenderness of the abdomen diffusely but no rigidity or guarding.  He is moving all extremities with full active range of motion  sensation intact no facial asymmetry no dysarthria   ED Results / Procedures / Treatments   Labs (all labs ordered are listed, but only abnormal results are displayed) Labs Reviewed  COMPREHENSIVE METABOLIC PANEL - Abnormal; Notable for the following components:      Result Value   Potassium 3.3 (*)    Calcium 8.3 (*)    Total Protein 6.1 (*)    Albumin 2.2 (*)    AST 93 (*)    ALT 46 (*)    Alkaline Phosphatase 218 (*)    Total Bilirubin 1.7 (*)    Anion gap 4 (*)    All other components within normal limits  CBC - Abnormal; Notable for the following components:   RBC 3.28 (*)    Hemoglobin 10.6 (*)    HCT 32.1 (*)    Platelets 103 (*)    All other components within normal limits  LIPASE, BLOOD  TROPONIN I (HIGH SENSITIVITY)  TROPONIN I (HIGH SENSITIVITY)     I ordered and reviewed the above labs they are notable for he has a mildly elevated liver enzymes at 90  and 40 of AST and ALT respectively, normal white blood cell count  EKG  ED ECG REPORT I, Lucillie Garfinkel, the attending physician, personally viewed and interpreted this ECG.   Date: 11/18/2022  EKG Time: 2025  Rate: 48  Rhythm: sinus bradycardia  Axis: nl  Intervals:none  ST&T Change: No acute ischemic changes    RADIOLOGY I independently reviewed and interpreted CT of the head see no obvious bleeding or midline shift   PROCEDURES:  Critical Care performed: No  Procedures   MEDICATIONS ORDERED IN ED: Medications  iohexol (OMNIPAQUE) 300 MG/ML solution 100 mL (100 mLs Intravenous Contrast Given 11/18/22 2130)  meclizine (ANTIVERT) tablet 25 mg (25 mg Oral Given 11/18/22 2156)  sodium chloride 0.9 % bolus 500 mL (0 mLs Intravenous Stopped 11/18/22 2224)  morphine (PF) 4 MG/ML injection 4 mg (4 mg Intravenous Given 11/18/22 2156)     IMPRESSION / MDM / ASSESSMENT AND PLAN / ED COURSE  I reviewed the triage vital signs and the nursing notes.                                Patient's presentation  is most consistent with acute presentation with potential threat to life or bodily function.  Differential diagnosis includes, but is not limited to, postsurgical infection, obstruction, postsurgery pain, intracranial bleeding, headache syndrome, peripheral vertigo, CVA   The patient is on the cardiac monitor to evaluate for evidence of arrhythmia and/or significant heart rate changes.  MDM:   Patient with ongoing abdominal pain since surgery.  Multiple CT scan showed no acute pathology and repeat today shows no emergent findings as well, postsurgical changes healing appropriately.  Patient's pain is well-controlled in the emergency department with some medications.  In terms of his dizziness and headache has resolved with some meclizine and fluids he has no other focal neurologic deficits to suggest CVA.  His CT scan was negative. \  He has no urinary symptoms and his urinalysis from several days ago was negative.  I will defer further testing today.  I considered hospitalization for admission or observation however given pain well-controlled and negative workup as above, I think outpatient follow-up and monitoring most appropriate at this time patient in agreement.  Plan for discharge with return precautions given.        FINAL CLINICAL IMPRESSION(S) / ED DIAGNOSES   Final diagnoses:  Generalized abdominal pain  Dizziness     Rx / DC Orders   ED Discharge Orders          Ordered    meclizine (ANTIVERT) 25 MG tablet  2 times daily PRN        11/18/22 2330    oxyCODONE (ROXICODONE) 5 MG immediate release tablet  Every 8 hours PRN        11/18/22 2330             Note:  This document was prepared using Dragon voice recognition software and may include unintentional dictation errors.    Lucillie Garfinkel, MD 11/18/22 2330

## 2022-11-18 NOTE — ED Triage Notes (Signed)
Pt also c/o dizziness with position changes. Pt states severe upper abd pain with position changes. Pt A&O x4. Pt in NAD at this time. Pt states has been seen for same.

## 2022-11-18 NOTE — ED Triage Notes (Signed)
First RN Note: pt to ED via [facility] from side of the ride with c/o abd pain. Pt had appendectomy on 11/05/2022. Per EMS pt c/o severe abd pain at this time. Per EMS pt also c/o some bleeding at the incision site. Pt seen recently on 3/21 for same.   158/76 84HR 98% RA

## 2022-11-18 NOTE — Discharge Instructions (Signed)
Thank you for choosing us for your health care today!  Please see your primary doctor this week for a follow up appointment.   Sometimes, in the early stages of certain disease courses it is difficult to detect in the emergency department evaluation -- so, it is important that you continue to monitor your symptoms and call your doctor right away or return to the emergency department if you develop any new or worsening symptoms.  Please go to the following website to schedule new (and existing) patient appointments:   [email or web address]  If you do not have a primary doctor try calling the following clinics to establish care:  If you have insurance:  [facility] [phone] [address] [place] [address]   [name]  [phone] [address] [place] [address] [place] [address]   If you do not have insurance:  [facility]  [phone] [address] [place] [address]   The following is another list of primary care offices in the area who are accepting new patients at this time.  Please reach out to one of them directly and let them know you would like to schedule an appointment to follow up on an Emergency Department visit, and/or to establish a new primary care provider (PCP).  There are likely other primary care clinics in the are who are accepting new patients, but this is an excellent place to start:  [facility] Lead physician: Dr Angela Bacigalupo [address] [place], [place] [address] [phone]  [facility] Lead Physician: Dr Krichna Sowles [address], , [place] [address] [phone]  [facility]  Lead Physician: Dr Megan Johnson [address], [place], [place] [address] [phone]  [facility] Lead Physician: Dr Alex Karamalegos [address], [place], [place] [address] [phone]  [facility] [place] Lead Physician: Dr Laura Berglund [address], [place], [place] [address] [phone]   It was my pleasure to care for you today.   Hanif Radin S. Derell Bruun, MD  

## 2022-12-13 NOTE — Congregational Nurse Program (Signed)
  Dept: [phone]   Congregational Nurse Program Note  Date of Encounter: 12/13/2022 Client to [facility] day center for support, conversation and to update this Rn regarding his hospital stay. Rn did advise him that the health dept RN needed to speak with him HE was agreeable to RN contacting the health dept RN during this visit. Call placed and HIPAA compliant message left on both numbers given by that RN. Client is aware that he can contact the health dept as well. He reports he knows he has hepatitis B and that he was told he has liver cancer so that he was "not going to seek treatment". RN strongly encouraged client to contact the health dept RN. He did agree for his current mobile number be given to the health dept RN if she calls back. Francesco Runner BSN, RN  Past Medical History: Past Medical History:  Diagnosis Date   Asthma    CHF (congestive heart failure) (HCC)    COPD (chronic obstructive pulmonary disease) (HCC)    COVID-19 03/2020   diagnosed in August 2021   Diabetes mellitus without complication (HCC)    Homelessness    Hypertension    Infestation by bed bug    Migraine    Obesity    Sleep apnea     Encounter Details:  CNP Questionnaire - 12/13/22 1023       Questionnaire   Ask client: Do you give verbal consent for me to treat you today? Yes    Student Assistance [employer] Nurse    Location Patient Served  [facility]    Visit Setting with Client Organization    Patient Status Unhoused   client [place]   [employer];Medicare    Insurance/Financial Assistance Referral N/A    Medication N/A   has all medications, med box given at today's visit   Medical Provider Yes   [facility]   Screening Referrals Made N/A    Medical Referrals Made Health Department   RN had received a call from the health dept looking for Mr. Goin, the nurse wanted to speak with his regarding some blood work he had  wihile at [facility] ED. Client notified   Medical Appointment Made N/A   assisted client with making apt at [facility] for 3/22 at 10 am   Recently w/o PCP, now 1st time PCP visit completed due to CNs referral or appointment made N/A    Food Have Food Insecurities    Transportation N/A   client reports having Medicare/Medicaid transportation.   Housing/Utilities No permanent housing    Interpersonal Safety N/A    Interventions Advocate/Support;Navigate Healthcare System   client reported being in the ED, was seen on 11/18   Abnormal to Normal Screening Since Last CN Visit N/A    Screenings CN Performed Blood Pressure;Pulse Ox    Sent Client to Lab for: N/A    Did client attend any of the following based off CNs referral or appointments made? Medical   apt made for [facility] at last visit, client reports he did attend  that apt   ED Visit Averted N/A    Life-Saving Intervention Made N/A

## 2022-12-20 NOTE — Congregational Nurse Program (Signed)
  Dept: [phone]   Congregational Nurse Program Note  Date of Encounter: 12/20/2022 Client to [facility] reports he has not heard from the health dept. Of note client uses a friend's phone and may not have gotten any message. Client did have a package delivery ticket, left yesterday at the center, ticket given to client. He will go to the post office to get the package. He believes it is the phone he had ordered. Client reports he has  PCP pat on 4/29 at [facility]. He plans to go to the health dept at that time to follow up with the infectious disease RN. Francesco Runner BSN, RN Past Medical History: Past Medical History:  Diagnosis Date   Asthma    CHF (congestive heart failure) (HCC)    COPD (chronic obstructive pulmonary disease) (HCC)    COVID-19 03/2020   diagnosed in August 2021   Diabetes mellitus without complication (HCC)    Homelessness    Hypertension    Infestation by bed bug    Migraine    Obesity    Sleep apnea     Encounter Details:  CNP Questionnaire - 12/20/22 1305       Questionnaire   Ask client: Do you give verbal consent for me to treat you today? Yes    Student Assistance N/A    Location Patient Served  [facility]    Visit Setting with Client Organization    Patient Status Unhoused   client reports he [place]   [employer];Medicare    Insurance/Financial Assistance Referral N/A    Medication N/A   has all medications, med box given at today's visit   Medical Provider Yes   [facility], Dr. Allena Katz, client reports he has an apt on 4/29   Screening Referrals Made N/A    Medical Referrals Made Health Department   RN contacted the infectious disease RN at the health dept as she wanted to speak with Chanetta Marshall, she was unavailable at the time of the call   Medical Appointment Made N/A   assisted client with making apt at [facility] for 3/22 at 10 am   Recently w/o PCP, now 1st  time PCP visit completed due to CNs referral or appointment made N/A    Food Have Food Insecurities    Transportation N/A   client reports having Medicare/Medicaid transportation.   Housing/Utilities No permanent housing    Interpersonal Safety N/A    Interventions Advocate/Support;Navigate Healthcare System   client reported being in the ED, was seen on 11/18   Abnormal to Normal Screening Since Last CN Visit N/A    Sent Client to Lab for: N/A    Did client attend any of the following based off CNs referral or appointments made? N/A    ED Visit Averted N/A    Life-Saving Intervention Made N/A

## 2022-12-23 ENCOUNTER — Other Ambulatory Visit: Payer: Self-pay

## 2022-12-23 ENCOUNTER — Emergency Department: Payer: 59

## 2022-12-23 ENCOUNTER — Inpatient Hospital Stay
Admission: EM | Admit: 2022-12-23 | Discharge: 2022-12-27 | DRG: 312 | Disposition: A | Discharge status: Home / Self Care | Payer: 59 | Attending: Internal Medicine | Admitting: Internal Medicine

## 2022-12-23 DIAGNOSIS — K219 Gastro-esophageal reflux disease without esophagitis: Secondary | ICD-10-CM | POA: Diagnosis present

## 2022-12-23 DIAGNOSIS — Z8249 Family history of ischemic heart disease and other diseases of the circulatory system: Secondary | ICD-10-CM

## 2022-12-23 DIAGNOSIS — J4489 Other specified chronic obstructive pulmonary disease: Secondary | ICD-10-CM | POA: Diagnosis present

## 2022-12-23 DIAGNOSIS — E8809 Other disorders of plasma-protein metabolism, not elsewhere classified: Secondary | ICD-10-CM | POA: Diagnosis present

## 2022-12-23 DIAGNOSIS — I5032 Chronic diastolic (congestive) heart failure: Secondary | ICD-10-CM | POA: Diagnosis present

## 2022-12-23 DIAGNOSIS — Z91030 Bee allergy status: Secondary | ICD-10-CM

## 2022-12-23 DIAGNOSIS — E785 Hyperlipidemia, unspecified: Secondary | ICD-10-CM | POA: Diagnosis present

## 2022-12-23 DIAGNOSIS — Z6833 Body mass index (BMI) 33.0-33.9, adult: Secondary | ICD-10-CM

## 2022-12-23 DIAGNOSIS — Z87891 Personal history of nicotine dependence: Secondary | ICD-10-CM

## 2022-12-23 DIAGNOSIS — R0781 Pleurodynia: Secondary | ICD-10-CM

## 2022-12-23 DIAGNOSIS — R079 Chest pain, unspecified: Principal | ICD-10-CM

## 2022-12-23 DIAGNOSIS — Z5902 Unsheltered homelessness: Secondary | ICD-10-CM

## 2022-12-23 DIAGNOSIS — B1910 Unspecified viral hepatitis B without hepatic coma: Secondary | ICD-10-CM | POA: Insufficient documentation

## 2022-12-23 DIAGNOSIS — Z9049 Acquired absence of other specified parts of digestive tract: Secondary | ICD-10-CM

## 2022-12-23 DIAGNOSIS — I110 Hypertensive heart disease with heart failure: Secondary | ICD-10-CM | POA: Diagnosis present

## 2022-12-23 DIAGNOSIS — R55 Syncope and collapse: Secondary | ICD-10-CM | POA: Diagnosis present

## 2022-12-23 DIAGNOSIS — Z8616 Personal history of COVID-19: Secondary | ICD-10-CM

## 2022-12-23 DIAGNOSIS — F32A Depression, unspecified: Secondary | ICD-10-CM | POA: Diagnosis present

## 2022-12-23 DIAGNOSIS — R001 Bradycardia, unspecified: Secondary | ICD-10-CM | POA: Diagnosis present

## 2022-12-23 DIAGNOSIS — Z882 Allergy status to sulfonamides status: Secondary | ICD-10-CM

## 2022-12-23 DIAGNOSIS — I2489 Other forms of acute ischemic heart disease: Secondary | ICD-10-CM | POA: Diagnosis present

## 2022-12-23 DIAGNOSIS — I498 Other specified cardiac arrhythmias: Secondary | ICD-10-CM

## 2022-12-23 DIAGNOSIS — K766 Portal hypertension: Secondary | ICD-10-CM | POA: Diagnosis present

## 2022-12-23 DIAGNOSIS — Z885 Allergy status to narcotic agent status: Secondary | ICD-10-CM

## 2022-12-23 DIAGNOSIS — F419 Anxiety disorder, unspecified: Secondary | ICD-10-CM | POA: Diagnosis present

## 2022-12-23 DIAGNOSIS — D696 Thrombocytopenia, unspecified: Secondary | ICD-10-CM | POA: Diagnosis present

## 2022-12-23 DIAGNOSIS — K7460 Unspecified cirrhosis of liver: Secondary | ICD-10-CM | POA: Diagnosis present

## 2022-12-23 DIAGNOSIS — E2740 Unspecified adrenocortical insufficiency: Secondary | ICD-10-CM

## 2022-12-23 DIAGNOSIS — Z884 Allergy status to anesthetic agent status: Secondary | ICD-10-CM

## 2022-12-23 DIAGNOSIS — I951 Orthostatic hypotension: Secondary | ICD-10-CM | POA: Diagnosis not present

## 2022-12-23 DIAGNOSIS — Z79899 Other long term (current) drug therapy: Secondary | ICD-10-CM

## 2022-12-23 DIAGNOSIS — Z7984 Long term (current) use of oral hypoglycemic drugs: Secondary | ICD-10-CM

## 2022-12-23 DIAGNOSIS — E119 Type 2 diabetes mellitus without complications: Secondary | ICD-10-CM | POA: Diagnosis present

## 2022-12-23 DIAGNOSIS — K7682 Hepatic encephalopathy: Secondary | ICD-10-CM | POA: Diagnosis present

## 2022-12-23 DIAGNOSIS — E669 Obesity, unspecified: Secondary | ICD-10-CM | POA: Diagnosis present

## 2022-12-23 DIAGNOSIS — G4730 Sleep apnea, unspecified: Secondary | ICD-10-CM | POA: Diagnosis present

## 2022-12-23 DIAGNOSIS — D649 Anemia, unspecified: Secondary | ICD-10-CM | POA: Diagnosis present

## 2022-12-23 DIAGNOSIS — Z7982 Long term (current) use of aspirin: Secondary | ICD-10-CM

## 2022-12-23 DIAGNOSIS — I259 Chronic ischemic heart disease, unspecified: Secondary | ICD-10-CM | POA: Insufficient documentation

## 2022-12-23 LAB — CBC
HCT: 35.3 % — ABNORMAL LOW (ref 39.0–52.0)
Hemoglobin: 11.6 g/dL — ABNORMAL LOW (ref 13.0–17.0)
MCH: 30.7 pg (ref 26.0–34.0)
MCHC: 32.9 g/dL (ref 30.0–36.0)
MCV: 93.4 fL (ref 80.0–100.0)
Platelets: 115 10*3/uL — ABNORMAL LOW (ref 150–400)
RBC: 3.78 MIL/uL — ABNORMAL LOW (ref 4.22–5.81)
RDW: 14.5 % (ref 11.5–15.5)
WBC: 5.4 10*3/uL (ref 4.0–10.5)
nRBC: 0 % (ref 0.0–0.2)

## 2022-12-23 LAB — TROPONIN I (HIGH SENSITIVITY)
Troponin I (High Sensitivity): 27 ng/L — ABNORMAL HIGH (ref <18)
Troponin I (High Sensitivity): 22 ng/L — ABNORMAL HIGH (ref <18)

## 2022-12-23 LAB — BASIC METABOLIC PANEL
Anion gap: 3 — ABNORMAL LOW (ref 5–15)
BUN: 30 mg/dL — ABNORMAL HIGH (ref 8–23)
CO2: 33 mmol/L — ABNORMAL HIGH (ref 22–32)
Calcium: 9.2 mg/dL (ref 8.9–10.3)
Chloride: 103 mmol/L (ref 98–111)
Creatinine, Ser: 1.22 mg/dL (ref 0.61–1.24)
GFR, Estimated: >60 mL/min (ref >60)
Glucose, Bld: 117 mg/dL — ABNORMAL HIGH (ref 70–99)
Potassium: 4.1 mmol/L (ref 3.5–5.1)
Sodium: 139 mmol/L (ref 135–145)

## 2022-12-23 LAB — BRAIN NATRIURETIC PEPTIDE: B Natriuretic Peptide: 293.3 pg/mL — ABNORMAL HIGH (ref 0.0–100.0)

## 2022-12-23 NOTE — ED Triage Notes (Signed)
First Nurse Note:  Pt via EMS from [employer]. Pt c/o CP for the past 4 days, pain is worse with exertion. Pt is A&Ox4 and NAD 12 lead EKG VSS per EMS EMS gave 324 ASA and 1 Nitro PTA.

## 2022-12-23 NOTE — ED Triage Notes (Signed)
Pt here with cp that started yesterday. Pt states pain is centered and radiates to his left arm. Pt states pain is constant and is sharp and stabbing. Pt states he has a list of current medical issues.

## 2022-12-23 NOTE — ED Provider Notes (Signed)
[facility] Provider Note    Event Date/Time   First MD Initiated Contact with Patient 12/23/22 2226     (approximate)   History   Chest Pain   HPI  [name] is a 72 y.o. male  with cirrhosis who comes in with chest pain.  Pt reports having sudden onset of chest pain radiating onto the right side of his chest down into his arm with an episode of syncope where he states that he blacked out and he woke up and he was kneeling with his head up against something and he thinks that he hit his head he reports some neck pain as well.  He denies any alcohol, drug use.  Does report history is with chronic issues with his liver.  He reports still having the pain in his chest at this time.  Denies ever having anything like this happen before.  Physical Exam   Triage Vital Signs: ED Triage Vitals [12/23/22 1812]  Enc Vitals Group     BP (!) 108/56     Pulse Rate 64     Resp 18     Temp 98.9 F (37.2 C)     Temp Source Oral     SpO2 100 %     Weight 209 lb 7 oz (95 kg)     Height 5\' 6"  (1.676 m)     Head Circumference      Peak Flow      Pain Score 10     Pain Loc      Pain Edu?      Excl. in [place]?     Most recent vital signs: Vitals:   12/23/22 2250 12/23/22 2300  BP: 139/61 129/62  Pulse: (!) 59 (!) 55  Resp: 12 13  Temp:    SpO2: 100% 100%     General: Awake, no distress.  CV:  Good peripheral perfusion.  Resp:  Normal effort.  Abd:  No distention.  Slight tenderness in his abdomen Other:  No swelling in legs.  No calf tenderness   ED Results / Procedures / Treatments   Labs (all labs ordered are listed, but only abnormal results are displayed) Labs Reviewed  BASIC METABOLIC PANEL - Abnormal; Notable for the following components:      Result Value   CO2 33 (*)    Glucose, Bld 117 (*)    BUN 30 (*)    Anion gap 3 (*)    All other components within normal limits  CBC - Abnormal; Notable for the following components:   RBC 3.78 (*)     Hemoglobin 11.6 (*)    HCT 35.3 (*)    Platelets 115 (*)    All other components within normal limits  BRAIN NATRIURETIC PEPTIDE - Abnormal; Notable for the following components:   B Natriuretic Peptide 293.3 (*)    All other components within normal limits  TROPONIN I (HIGH SENSITIVITY) - Abnormal; Notable for the following components:   Troponin I (High Sensitivity) 27 (*)    All other components within normal limits  HEPATIC FUNCTION PANEL  URINALYSIS, ROUTINE W REFLEX MICROSCOPIC  TROPONIN I (HIGH SENSITIVITY)     EKG  My interpretation of EKG:  Sinus rate of 64 without any ST elevation or T wave inversions except V2, normal intervals  RADIOLOGY I have reviewed the xray personally and interpreted and there is no evidence of any pneumonia  PROCEDURES:  Critical Care performed: No  Procedures  MEDICATIONS ORDERED IN ED: Medications - No data to display   IMPRESSION / MDM / ASSESSMENT AND PLAN / ED COURSE  I reviewed the triage vital signs and the nursing notes.   Patient's presentation is most consistent with acute presentation with potential threat to life or bodily function.   Patient comes in with syncopal episode with significant chest pain some going to his abdomen possible hitting his head will get CT imaging to evaluate further acute pathology.  Liver test are reassuring.  Troponin was elevated.  BMP reassuring hemoglobin reassuring.  Patient handed off to oncoming team pending CT imaging given the elevated troponin in setting of syncopal slightly will require admission for syncopal workup  The patient is on the cardiac monitor to evaluate for evidence of arrhythmia and/or significant heart rate changes.      FINAL CLINICAL IMPRESSION(S) / ED DIAGNOSES   Final diagnoses:  Chest pain, unspecified type  Syncope, unspecified syncope type     Rx / DC Orders   ED Discharge Orders     None        Note:  This document was prepared using  Dragon voice recognition software and may include unintentional dictation errors.   Concha Se, MD 12/24/22 [phone]

## 2022-12-24 ENCOUNTER — Observation Stay (HOSPITAL_COMMUNITY)
Admit: 2022-12-24 | Discharge: 2022-12-24 | Disposition: A | Discharge status: Home / Self Care | Payer: 59 | Attending: Internal Medicine | Admitting: Internal Medicine

## 2022-12-24 ENCOUNTER — Inpatient Hospital Stay: Payer: 59

## 2022-12-24 ENCOUNTER — Encounter: Payer: Self-pay | Admitting: Internal Medicine

## 2022-12-24 ENCOUNTER — Emergency Department: Payer: 59

## 2022-12-24 DIAGNOSIS — E669 Obesity, unspecified: Secondary | ICD-10-CM | POA: Diagnosis present

## 2022-12-24 DIAGNOSIS — F419 Anxiety disorder, unspecified: Secondary | ICD-10-CM | POA: Diagnosis present

## 2022-12-24 DIAGNOSIS — R0781 Pleurodynia: Secondary | ICD-10-CM

## 2022-12-24 DIAGNOSIS — R55 Syncope and collapse: Secondary | ICD-10-CM

## 2022-12-24 DIAGNOSIS — K7460 Unspecified cirrhosis of liver: Secondary | ICD-10-CM | POA: Diagnosis present

## 2022-12-24 DIAGNOSIS — R001 Bradycardia, unspecified: Secondary | ICD-10-CM | POA: Diagnosis present

## 2022-12-24 DIAGNOSIS — Z7984 Long term (current) use of oral hypoglycemic drugs: Secondary | ICD-10-CM | POA: Diagnosis not present

## 2022-12-24 DIAGNOSIS — E785 Hyperlipidemia, unspecified: Secondary | ICD-10-CM

## 2022-12-24 DIAGNOSIS — K7469 Other cirrhosis of liver: Secondary | ICD-10-CM

## 2022-12-24 DIAGNOSIS — D649 Anemia, unspecified: Secondary | ICD-10-CM | POA: Diagnosis present

## 2022-12-24 DIAGNOSIS — I498 Other specified cardiac arrhythmias: Secondary | ICD-10-CM | POA: Diagnosis not present

## 2022-12-24 DIAGNOSIS — K766 Portal hypertension: Secondary | ICD-10-CM | POA: Diagnosis present

## 2022-12-24 DIAGNOSIS — F32A Depression, unspecified: Secondary | ICD-10-CM | POA: Diagnosis present

## 2022-12-24 DIAGNOSIS — E8809 Other disorders of plasma-protein metabolism, not elsewhere classified: Secondary | ICD-10-CM | POA: Diagnosis present

## 2022-12-24 DIAGNOSIS — E2740 Unspecified adrenocortical insufficiency: Secondary | ICD-10-CM | POA: Diagnosis present

## 2022-12-24 DIAGNOSIS — D696 Thrombocytopenia, unspecified: Secondary | ICD-10-CM | POA: Diagnosis present

## 2022-12-24 DIAGNOSIS — I951 Orthostatic hypotension: Secondary | ICD-10-CM | POA: Diagnosis present

## 2022-12-24 DIAGNOSIS — E119 Type 2 diabetes mellitus without complications: Secondary | ICD-10-CM | POA: Diagnosis present

## 2022-12-24 DIAGNOSIS — I259 Chronic ischemic heart disease, unspecified: Secondary | ICD-10-CM | POA: Insufficient documentation

## 2022-12-24 DIAGNOSIS — I5032 Chronic diastolic (congestive) heart failure: Secondary | ICD-10-CM | POA: Diagnosis present

## 2022-12-24 DIAGNOSIS — B1910 Unspecified viral hepatitis B without hepatic coma: Secondary | ICD-10-CM | POA: Diagnosis present

## 2022-12-24 DIAGNOSIS — Z5902 Unsheltered homelessness: Secondary | ICD-10-CM | POA: Diagnosis not present

## 2022-12-24 DIAGNOSIS — I2489 Other forms of acute ischemic heart disease: Secondary | ICD-10-CM | POA: Diagnosis present

## 2022-12-24 DIAGNOSIS — K219 Gastro-esophageal reflux disease without esophagitis: Secondary | ICD-10-CM | POA: Diagnosis present

## 2022-12-24 DIAGNOSIS — I110 Hypertensive heart disease with heart failure: Secondary | ICD-10-CM | POA: Diagnosis present

## 2022-12-24 DIAGNOSIS — G4730 Sleep apnea, unspecified: Secondary | ICD-10-CM | POA: Diagnosis present

## 2022-12-24 DIAGNOSIS — J4489 Other specified chronic obstructive pulmonary disease: Secondary | ICD-10-CM | POA: Diagnosis present

## 2022-12-24 DIAGNOSIS — Z8616 Personal history of COVID-19: Secondary | ICD-10-CM | POA: Diagnosis not present

## 2022-12-24 DIAGNOSIS — K7682 Hepatic encephalopathy: Secondary | ICD-10-CM | POA: Diagnosis present

## 2022-12-24 LAB — URINALYSIS, ROUTINE W REFLEX MICROSCOPIC
Bilirubin Urine: NEGATIVE
Glucose, UA: NEGATIVE mg/dL
Hgb urine dipstick: NEGATIVE
Ketones, ur: NEGATIVE mg/dL
Leukocytes,Ua: NEGATIVE
Nitrite: NEGATIVE
Protein, ur: NEGATIVE mg/dL
Specific Gravity, Urine: 1.028 (ref 1.005–1.030)
pH: 6 (ref 5.0–8.0)

## 2022-12-24 LAB — CBC
HCT: 30.3 % — ABNORMAL LOW (ref 39.0–52.0)
Hemoglobin: 9.8 g/dL — ABNORMAL LOW (ref 13.0–17.0)
MCH: 30.1 pg (ref 26.0–34.0)
MCHC: 32.3 g/dL (ref 30.0–36.0)
MCV: 92.9 fL (ref 80.0–100.0)
Platelets: 76 10*3/uL — ABNORMAL LOW (ref 150–400)
RBC: 3.26 MIL/uL — ABNORMAL LOW (ref 4.22–5.81)
RDW: 14.7 % (ref 11.5–15.5)
WBC: 3.3 10*3/uL — ABNORMAL LOW (ref 4.0–10.5)
nRBC: 0 % (ref 0.0–0.2)

## 2022-12-24 LAB — COMPREHENSIVE METABOLIC PANEL
ALT: 16 U/L (ref 0–44)
AST: 32 U/L (ref 15–41)
Albumin: 2 g/dL — ABNORMAL LOW (ref 3.5–5.0)
Alkaline Phosphatase: 76 U/L (ref 38–126)
Anion gap: 5 (ref 5–15)
BUN: 29 mg/dL — ABNORMAL HIGH (ref 8–23)
CO2: 28 mmol/L (ref 22–32)
Calcium: 8.7 mg/dL — ABNORMAL LOW (ref 8.9–10.3)
Chloride: 105 mmol/L (ref 98–111)
Creatinine, Ser: 1.17 mg/dL (ref 0.61–1.24)
GFR, Estimated: >60 mL/min (ref >60)
Glucose, Bld: 119 mg/dL — ABNORMAL HIGH (ref 70–99)
Potassium: 3.7 mmol/L (ref 3.5–5.1)
Sodium: 138 mmol/L (ref 135–145)
Total Bilirubin: 1.4 mg/dL — ABNORMAL HIGH (ref 0.3–1.2)
Total Protein: 5 g/dL — ABNORMAL LOW (ref 6.5–8.1)

## 2022-12-24 LAB — CBG MONITORING, ED
Glucose-Capillary: 76 mg/dL (ref 70–99)
Glucose-Capillary: 106 mg/dL — ABNORMAL HIGH (ref 70–99)
Glucose-Capillary: 105 mg/dL — ABNORMAL HIGH (ref 70–99)
Glucose-Capillary: 98 mg/dL (ref 70–99)

## 2022-12-24 LAB — ECHOCARDIOGRAM COMPLETE
AR max vel: 2.57 cm2
AV Area VTI: 2.77 cm2
AV Area mean vel: 2.68 cm2
AV Mean grad: 6 mmHg
AV Peak grad: 11.7 mmHg
Ao pk vel: 1.71 m/s
Area-P 1/2: 2.56 cm2
Height: 66 in
MV VTI: 2.04 cm2
S' Lateral: 3.3 cm
Weight: 3350.99 oz

## 2022-12-24 LAB — HEPATIC FUNCTION PANEL
ALT: 19 U/L (ref 0–44)
AST: 36 U/L (ref 15–41)
Albumin: 2.4 g/dL — ABNORMAL LOW (ref 3.5–5.0)
Alkaline Phosphatase: 92 U/L (ref 38–126)
Bilirubin, Direct: 0.4 mg/dL — ABNORMAL HIGH (ref 0.0–0.2)
Indirect Bilirubin: 0.9 mg/dL (ref 0.3–0.9)
Total Bilirubin: 1.3 mg/dL — ABNORMAL HIGH (ref 0.3–1.2)
Total Protein: 5.8 g/dL — ABNORMAL LOW (ref 6.5–8.1)

## 2022-12-24 LAB — MAGNESIUM: Magnesium: 1.6 mg/dL — ABNORMAL LOW (ref 1.7–2.4)

## 2022-12-24 LAB — GLUCOSE, CAPILLARY
Glucose-Capillary: 62 mg/dL — ABNORMAL LOW (ref 70–99)
Glucose-Capillary: 128 mg/dL — ABNORMAL HIGH (ref 70–99)

## 2022-12-24 LAB — PHOSPHORUS: Phosphorus: 3.9 mg/dL (ref 2.5–4.6)

## 2022-12-24 LAB — HEMOGLOBIN A1C
Hgb A1c MFr Bld: 5.1 % (ref 4.8–5.6)
Mean Plasma Glucose: 99.67 mg/dL

## 2022-12-24 MED ORDER — FENTANYL CITRATE PF 50 MCG/ML IJ SOSY
50.0000 ug | PREFILLED_SYRINGE | Freq: Once | INTRAMUSCULAR | Status: AC
  Administered 2022-12-24: 50 ug via INTRAVENOUS
  Filled 2022-12-24: qty 1

## 2022-12-24 MED ORDER — QUETIAPINE FUMARATE 25 MG PO TABS
50.0000 mg | ORAL_TABLET | Freq: Every day | ORAL | Status: DC
  Administered 2022-12-24 – 2022-12-26 (×4): 50 mg via ORAL
  Filled 2022-12-24 (×5): qty 2

## 2022-12-24 MED ORDER — PANTOPRAZOLE SODIUM 20 MG PO TBEC
20.0000 mg | DELAYED_RELEASE_TABLET | Freq: Every day | ORAL | Status: DC
  Administered 2022-12-24 – 2022-12-27 (×4): 20 mg via ORAL
  Filled 2022-12-24 (×4): qty 1

## 2022-12-24 MED ORDER — INSULIN ASPART 100 UNIT/ML IJ SOLN: 0.0000 [IU] | Freq: Every day | INTRAMUSCULAR | Status: DC

## 2022-12-24 MED ORDER — ASPIRIN 81 MG PO TBEC
81.0000 mg | DELAYED_RELEASE_TABLET | Freq: Every day | ORAL | Status: DC
  Administered 2022-12-24 – 2022-12-27 (×4): 81 mg via ORAL
  Filled 2022-12-24 (×4): qty 1

## 2022-12-24 MED ORDER — ACETAMINOPHEN 325 MG PO TABS
650.0000 mg | ORAL_TABLET | Freq: Four times a day (QID) | ORAL | Status: DC | PRN
  Administered 2022-12-24 – 2022-12-25 (×2): 650 mg via ORAL
  Filled 2022-12-24 (×2): qty 2

## 2022-12-24 MED ORDER — ENOXAPARIN SODIUM 60 MG/0.6ML IJ SOSY
0.5000 mg/kg | PREFILLED_SYRINGE | INTRAMUSCULAR | Status: DC
  Administered 2022-12-24 – 2022-12-26 (×3): 47.5 mg via SUBCUTANEOUS
  Filled 2022-12-24 (×3): qty 0.6

## 2022-12-24 MED ORDER — ADULT MULTIVITAMIN W/MINERALS CH
1.0000 | ORAL_TABLET | Freq: Every day | ORAL | Status: DC
  Administered 2022-12-24 – 2022-12-27 (×4): 1 via ORAL
  Filled 2022-12-24 (×4): qty 1

## 2022-12-24 MED ORDER — ATORVASTATIN CALCIUM 20 MG PO TABS
40.0000 mg | ORAL_TABLET | Freq: Every day | ORAL | Status: DC
  Administered 2022-12-24 – 2022-12-27 (×4): 40 mg via ORAL
  Filled 2022-12-24 (×4): qty 2

## 2022-12-24 MED ORDER — DOXEPIN HCL 50 MG PO CAPS
50.0000 mg | ORAL_CAPSULE | Freq: Every day | ORAL | Status: DC
  Administered 2022-12-24 – 2022-12-26 (×4): 50 mg via ORAL
  Filled 2022-12-24 (×5): qty 1

## 2022-12-24 MED ORDER — ONDANSETRON HCL 4 MG/2ML IJ SOLN
4.0000 mg | Freq: Once | INTRAMUSCULAR | Status: AC
  Administered 2022-12-24: 4 mg via INTRAVENOUS
  Filled 2022-12-24: qty 2

## 2022-12-24 MED ORDER — INSULIN ASPART 100 UNIT/ML IJ SOLN: 0.0000 [IU] | Freq: Three times a day (TID) | INTRAMUSCULAR | Status: DC

## 2022-12-24 MED ORDER — OXYCODONE HCL 5 MG PO TABS
5.0000 mg | ORAL_TABLET | Freq: Four times a day (QID) | ORAL | Status: DC | PRN
  Administered 2022-12-24 – 2022-12-26 (×5): 5 mg via ORAL
  Filled 2022-12-24 (×5): qty 1

## 2022-12-24 MED ORDER — SODIUM CHLORIDE 0.9 % IV SOLN: INTRAVENOUS | Status: DC

## 2022-12-24 MED ORDER — IOHEXOL 350 MG/ML SOLN
100.0000 mL | Freq: Once | INTRAVENOUS | Status: AC | PRN
  Administered 2022-12-24: 100 mL via INTRAVENOUS

## 2022-12-24 MED ORDER — CITALOPRAM HYDROBROMIDE 20 MG PO TABS
20.0000 mg | ORAL_TABLET | Freq: Every day | ORAL | Status: DC
  Administered 2022-12-24 – 2022-12-27 (×4): 20 mg via ORAL
  Filled 2022-12-24 (×4): qty 1

## 2022-12-24 MED ORDER — SODIUM CHLORIDE 0.9 % IV BOLUS
500.0000 mL | Freq: Once | INTRAVENOUS | Status: AC
  Administered 2022-12-24: 500 mL via INTRAVENOUS

## 2022-12-24 NOTE — Progress Notes (Signed)
Progress Note   Patient: [name] ZOX:[id number] DOB: 1951-08-05 DOA: 12/23/2022     0 DOS: the patient was seen and examined on 12/24/2022   Brief hospital course: 72 y.o. male with medical history significant for essential hypertension, type 2 diabetes, chronic diastolic CHF, homelessness, who presented to [facility] ED with complaints of chest pain and syncope.  Endorses intermittent stabbing centrally located chest pain for the past 4 days, worse with exertion.  The patient was walking at [facility] when he felt chest pain and then passed out.  EMS was activated.  En route the patient received full dose aspirin 324 mg x 1 and 1 dose of nitroglycerin.  The patient is a poor historian.  At the time of this visit, the patient states the pain feels like pressure.  It is centrally located and does not radiate.   In the ED, EKG shows junctional rhythm.  Labs studies notable for mildly elevated troponin, peaked at 27 and trended down.  12/24/2022.  Patient orthostatic.  IV fluid bolus and gentle IV fluids given.  Patient does have a history of congestive heart failure.  Assessment and Plan: * Orthostatic hypotension IV fluid bolus and gentle IV fluids.  If no response may need to start midodrine.  TED hose.  Junctional rhythm Patient not on any rate controlling medications.  Continue to monitor on telemetry.  Liver cirrhosis (HCC) Seen on prior imaging, chronic thrombocytopenia  Rib pain on right side Rib x-ray does not show any fracture.  As needed oxycodone.  Hyperlipidemia, unspecified Continue Lipitor  Thrombocytopenia (HCC) Chronic in nature likely with liver cirrhosis and hepatitis B.  Obesity (BMI 30-39.9) BMI 33.80 with current height and weight in computer.  Myocardial ischemia Troponin only slightly elevated.  Hepatitis B infection Refer to gastroenterology as outpatient.  Normocytic anemia Add ferritin        Subjective: Patient has some right-sided posterior rib  pain.  CT scan of the chest negative for dissection or aneurysm.  Physical Exam: Vitals:   12/24/22 1000 12/24/22 1150 12/24/22 1230 12/24/22 1400  BP: (!) 107/45 133/60 113/60 (!) 105/49  Pulse: (!) 50 (!) 56 (!) 50 (!) 51  Resp: (!) 21 (!) 21 20   Temp:      TempSrc:      SpO2: 98% 99% 96% 97%  Weight:      Height:       Physical Exam HENT:     Head: Normocephalic.     Mouth/Throat:     Pharynx: No oropharyngeal exudate.  Eyes:     General: Lids are normal.     Conjunctiva/sclera: Conjunctivae normal.  Cardiovascular:     Rate and Rhythm: Normal rate and regular rhythm.     Heart sounds: Normal heart sounds, S1 normal and S2 normal.  Pulmonary:     Breath sounds: No decreased breath sounds, wheezing, rhonchi or rales.  Chest:     Comments: Right posterior rib pain Abdominal:     Palpations: Abdomen is soft.     Tenderness: There is no abdominal tenderness.  Musculoskeletal:     Right lower leg: Swelling present.     Left lower leg: Swelling present.  Skin:    General: Skin is warm.     Findings: No rash.  Neurological:     Mental Status: He is alert and oriented to person, place, and time.     Data Reviewed: CT scan of the chest negative for aneurysm or dissection, shows hepatic cirrhosis, cervical  spine shows multilevel degenerative changes with neural foraminal stenosis at C5-C6, CT head negative for intracranial abnormality. Creatinine 1.17 with a GFR greater than 60, troponin 27 and 22, platelets 76, hemoglobin 9.8, white blood cell count 3.3, hemoglobin A1c 5.1   Disposition: Status is: Inpatient Remains inpatient appropriate because: Gentle IV fluids for orthostatic hypotension, check a.m. cortisol  Planned Discharge Destination: Home    Time spent: 28 minutes  Author: Alford Highland, MD 12/24/2022 4:25 PM  For on call review [email or web address].

## 2022-12-24 NOTE — Assessment & Plan Note (Signed)
Patient not on any rate controlling medications.  Continue to monitor on telemetry.

## 2022-12-24 NOTE — Assessment & Plan Note (Addendum)
Seen on prior imaging, pancytopenia, acute hepatic encephalopathy, slight coagulopathy

## 2022-12-24 NOTE — Assessment & Plan Note (Signed)
BMI 33.80 with current height and weight in computer.

## 2022-12-24 NOTE — TOC Initial Note (Addendum)
Transition of Care [facility]) - Initial/Assessment Note    Patient Details  Name: [name] MRN: [id number] Date of Birth: 1951-02-07  Transition of Care [facility]) CM/SW Contact:    Margarito Liner, LCSW Phone Number: 12/24/2022, 2:23 PM  Clinical Narrative:   CSW met with patient. No supports at bedside. CSW introduced role and inquired about homelessness. Patient plans to return to his tent at discharge. Will need cab voucher to [place] on [address] when discharged. He stated he is unable to go to shelter due to previous charges. Food, housing, extended stay motel, and transportation resources added to AVS. No further concerns. CSW encouraged patient to contact CSW as needed. CSW will continue to follow patient for support and facilitate discharge once medically stable.               Expected Discharge Plan:  (Tent) Barriers to Discharge: Continued Medical Work up   Patient Goals and CMS Choice            Expected Discharge Plan and Services     Post Acute Care Choice: NA Living arrangements for the past 2 months: Homeless                                      Prior Living Arrangements/Services Living arrangements for the past 2 months: Homeless Lives with:: Self Patient language and need for interpreter reviewed:: Yes Do you feel safe going back to the place where you live?: Yes            Criminal Activity/Legal Involvement Pertinent to Current Situation/Hospitalization: No - Comment as needed  Activities of Daily Living Home Assistive Devices/Equipment: None ADL Screening (condition at time of admission) Patient's cognitive ability adequate to safely complete daily activities?: Yes Is the patient deaf or have difficulty hearing?: No Does the patient have difficulty seeing, even when wearing glasses/contacts?: No Does the patient have difficulty concentrating, remembering, or making decisions?: No Patient able to express need for assistance with ADLs?:  Yes Does the patient have difficulty dressing or bathing?: No Independently performs ADLs?: Yes (appropriate for developmental age) Does the patient have difficulty walking or climbing stairs?: No Weakness of Legs: None Weakness of Arms/Hands: None  Permission Sought/Granted                  Emotional Assessment Appearance:: Appears stated age Attitude/Demeanor/Rapport: Engaged, Gracious Affect (typically observed): Accepting, Appropriate, Calm, Pleasant Orientation: : Oriented to Self, Oriented to Place, Oriented to  Time, Oriented to Situation Alcohol / Substance Use: Not Applicable Psych Involvement: No (comment)  Admission diagnosis:  Syncope [R55] Orthostatic hypotension [I95.1] Patient Active Problem List   Diagnosis Date Noted   Orthostatic hypotension 12/24/2022   Acute appendicitis 11/05/2022   Pancytopenia (HCC) 11/05/2022   Sinus bradycardia 11/05/2022   Acute metabolic encephalopathy 09/22/2022   Liver lesion 09/22/2022   Liver cirrhosis (HCC) 09/22/2022   HLD (hyperlipidemia) 09/22/2022   Diabetes mellitus without complication (HCC) 09/22/2022   Obesity (BMI 30-39.9) 09/22/2022   Acute hepatic encephalopathy (HCC) 09/22/2022   MDD (major depressive disorder), recurrent severe, 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   Major depressive disorder, 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 psychotic features (HCC) 09/15/2022   Protein-calorie malnutrition, severe (HCC) 08/31/2022   Hypotension 08/31/2022   Right sided weakness    Hx of transient ischemic attack (TIA) 04/28/2022   Obesity    Chronic diastolic CHF (congestive heart failure) (HCC)  Depression    Sepsis (HCC) 03/28/2022   Bacteremia due to Klebsiella pneumoniae 03/28/2022   Hypoglycemia 03/28/2022   Syncope 03/24/2022   Hypokalemia 03/24/2022   Fever 03/24/2022   COPD (chronic obstructive pulmonary disease) (HCC)    Elevated troponin    Abnormal LFTs    History of hepatitis C    AKI (acute kidney injury)  (HCC)    Pure hypercholesterolemia    Obesity, Class III, BMI 40-49.9 (morbid obesity) (HCC) 04/18/2020   Thrombocytopenia (HCC) 04/17/2020   Leukopenia 04/17/2020   HTN (hypertension) 04/17/2018   Uncontrolled type 2 diabetes mellitus with hyperglycemia, without long-term current use of insulin (HCC) 04/17/2018   Lymphedema 04/17/2018   Anemia 04/07/2018   Chest pain 03/20/2018   Unsheltered homelessness 12/16/2017   PCP:  [name], MD Pharmacy:   [facility] [address] [place] - [address] [place] [address] Phone: [phone] Fax: [phone]  [facility] [address]), [place] - [address] [place] ([address]) [place] [address] Phone: [phone] Fax: [phone]     Social Determinants of Health (SDOH) Social History: SDOH Screenings   Food Insecurity: Food Insecurity Present (12/24/2022)  Housing: High Risk (12/24/2022)  Transportation Needs: Unmet Transportation Needs (12/24/2022)  Utilities: Not At Risk (12/24/2022)  Alcohol Screen: Low Risk  (09/24/2022)  Depression (PHQ2-9): High Risk (04/26/2022)  Financial Resource Strain: High Risk (04/27/2022)  Physical Activity: Sufficiently Active (04/16/2018)  Social Connections: Moderately Isolated (04/30/2018)  Stress: Stress Concern Present (04/16/2018)  Tobacco Use: Medium Risk (12/24/2022)   SDOH Interventions:     Readmission Risk Interventions    11/07/2022    1:45 PM  Readmission Risk Prevention Plan  Transportation Screening Complete  SW Recovery Care/Counseling Consult Complete  Palliative Care Screening Not Applicable  Skilled Nursing Facility Not Applicable

## 2022-12-24 NOTE — ED Notes (Signed)
US at bedside for echo.

## 2022-12-24 NOTE — Progress Notes (Signed)
Anticoagulation monitoring(Lovenox):  72 yo male ordered Lovenox 40 mg Q24h    Filed Weights   12/23/22 1812  Weight: 95 kg (209 lb 7 oz)   BMI 33.8    Lab Results  Component Value Date   CREATININE 1.22 12/23/2022   CREATININE 0.75 11/18/2022   CREATININE 1.06 11/15/2022   Estimated Creatinine Clearance: 58.2 mL/min (by C-G formula based on SCr of 1.22 mg/dL). Hemoglobin & Hematocrit     Component Value Date/Time   HGB 11.6 (L) 12/23/2022 1814   HCT 35.3 (L) 12/23/2022 1814     Per Protocol for Patient with estCrcl > 30 ml/min and BMI > 30, will transition to Lovenox 47.5 mg Q24h.

## 2022-12-24 NOTE — Assessment & Plan Note (Signed)
Add on ferritin for further evaluation of anemia.

## 2022-12-24 NOTE — Hospital Course (Addendum)
72 y.o. male with medical history significant for essential hypertension, type 2 diabetes, chronic diastolic CHF, homelessness, who presented to [facility] ED with complaints of chest pain and syncope.  Endorses intermittent stabbing centrally located chest pain for the past 4 days, worse with exertion.  The patient was walking at [facility] when he felt chest pain and then passed out.  EMS was activated.  En route the patient received full dose aspirin 324 mg x 1 and 1 dose of nitroglycerin.  The patient is a poor historian.  At the time of this visit, the patient states the pain feels like pressure.  It is centrally located and does not radiate.   In the ED, EKG shows junctional rhythm.  Labs studies notable for mildly elevated troponin, peaked at 27 and trended down.  12/24/2022.  Patient orthostatic.  IV fluid bolus and gentle IV fluids given.  Patient does have a history of congestive heart failure. 4/30.  Patient's ammonia level high at 289.  Patient states that he gets so much diarrhea with the lactulose only can tolerate small amount.  Xifaxan prior approval by pharmacist.  Patient's a.m. cortisol level low at 4.7 so we will need to get a cosyntropin test tomorrow morning.  Started on midodrine for orthostatic hypotension

## 2022-12-24 NOTE — Assessment & Plan Note (Signed)
-  Continue Lipitor °

## 2022-12-24 NOTE — Assessment & Plan Note (Addendum)
Rib x-ray does not show any fracture.  As needed oxycodone.

## 2022-12-24 NOTE — Assessment & Plan Note (Signed)
Troponin only slightly elevated.

## 2022-12-24 NOTE — Assessment & Plan Note (Signed)
IV fluid bolus and gentle IV fluids.  If no response may need to start midodrine.  TED hose.

## 2022-12-24 NOTE — ED Notes (Signed)
Pt to xray

## 2022-12-24 NOTE — Assessment & Plan Note (Signed)
Refer to gastroenterology as outpatient.

## 2022-12-24 NOTE — Progress Notes (Signed)
*  PRELIMINARY RESULTS* Echocardiogram 2D Echocardiogram has been performed.  Cristela Blue 12/24/2022, 9:57 AM

## 2022-12-24 NOTE — H&P (Signed)
History and Physical  [name] ZOX:[id number] DOB: [birth year] DOA: 12/23/2022  Referring physician: Dr. Levon Hedger, EDP  PCP: [name], MD  Outpatient Specialists: Cardiology Patient coming from: ?Homelessness  Chief Complaint: Chesty pain and passed out   HPI: [name] is a 72 y.o. male with medical history significant for essential hypertension, type 2 diabetes, chronic diastolic CHF, homelessness, who presented to [facility] ED with complaints of chest pain and syncope.  Endorses intermittent stabbing centrally located chest pain for the past 4 days, worse with exertion.  The patient was walking at [facility] when he felt chest pain and then passed out.  EMS was activated.  En route the patient received full dose aspirin 324 mg x 1 and 1 dose of nitroglycerin.  The patient is a poor historian.  At the time of this visit, the patient states the pain feels like pressure.  It is centrally located and does not radiate.  In the ED, 12 lead EKG is non acute, no evidence of acute ischemia.  Labs studies notable for mildly elevated troponin, peaked at 27 and trended down.  Due to concern for possible cardiogenic syncope, TRH, hospitalist service, was asked to admit.  Admitted to telemetry cardiac unit as observation status.  ED Course: Tmax 98.9.  BP 108/56, pulse 64, respiratory 18, oxygen saturation 100% on room air.  Lab studies remarkable for serum bicarb 33, glucose 117, BUN 30, creatinine 1.22, GFR greater than 60, BNP 293, troponin 27, repeat 22.  Hemoglobin 11.6, platelet count 115, WBC 5.4.  Review of Systems: Review of systems as noted in the HPI. All other systems reviewed and are negative.   Past Medical History:  Diagnosis Date   Asthma    CHF (congestive heart failure) (HCC)    COPD (chronic obstructive pulmonary disease) (HCC)    COVID-19 03/2020   diagnosed in August 2021   Diabetes mellitus without complication (HCC)    Homelessness    Hypertension     Infestation by bed bug    Migraine    Obesity    Sleep apnea    Past Surgical History:  Procedure Laterality Date   CARDIAC CATHETERIZATION     CHOLECYSTECTOMY     EYE SURGERY     INNER EAR SURGERY     NOSE SURGERY     XI ROBOTIC LAPAROSCOPIC ASSISTED APPENDECTOMY N/A 11/05/2022   Procedure: XI ROBOTIC LAPAROSCOPIC ASSISTED APPENDECTOMY;  Surgeon: Carolan Shiver, MD;  Location: [facility] ORS;  Service: General;  Laterality: N/A;    Social History:  reports that he has quit smoking. He has never used smokeless tobacco. He reports that he does not drink alcohol and does not use drugs.   Allergies  Allergen Reactions   Bee Venom Anaphylaxis   Codeine Itching    Only itching. Recently allergy tested at [facility]   Novocain [Procaine]    Sulfa Antibiotics Other (See Comments)    Family History  Problem Relation Age of Onset   Hypertension Mother    Heart disease Mother    Heart failure Maternal Grandmother       Prior to Admission medications   Medication Sig Start Date End Date Taking? Authorizing Provider  ascorbic acid (VITAMIN C) 500 MG tablet Take 1 tablet (500 mg total) by mouth daily. 04/20/20  Yes Standley Brooking, MD  aspirin EC 81 MG tablet Take 1 tablet (81 mg total) by mouth daily. Swallow whole. 10/04/22  Yes Sarina Ill, DO  atorvastatin (LIPITOR) 40  MG tablet Take 1 tablet (40 mg total) by mouth daily. 10/04/22  Yes Sarina Ill, DO  citalopram (CELEXA) 20 MG tablet Take 1 tablet (20 mg total) by mouth daily. 10/04/22  Yes Sarina Ill, DO  doxepin (SINEQUAN) 50 MG capsule Take 1 capsule (50 mg total) by mouth at bedtime. 10/04/22  Yes Sarina Ill, DO  glucosamine-chondroitin 500-400 MG tablet Take 1 tablet by mouth 3 (three) times daily.   Yes [provider]  JARDIANCE 10 MG TABS tablet Take 1 tablet (10 mg total) by mouth daily. 04/26/22  Yes Hackney, Tina A, FNP  lactulose (CHRONULAC) 10 GM/15ML solution Take 30 mLs  (20 g total) by mouth 2 (two) times daily. 11/08/22  Yes Agbata, Tochukwu, MD  losartan (COZAAR) 50 MG tablet Take 1 tablet (50 mg total) by mouth daily after breakfast. 10/05/22  Yes Sarina Ill, DO  meclizine (ANTIVERT) 25 MG tablet Take 0.5 tablets (12.5 mg total) by mouth 2 (two) times daily as needed for up to 12 doses for dizziness. 11/18/22  Yes Pilar Jarvis, MD  metFORMIN (GLUCOPHAGE) 500 MG tablet Take 500 mg by mouth daily. 10/10/22  Yes [provider]  Multiple Vitamin (MULTIVITAMIN WITH MINERALS) TABS tablet Take 1 tablet by mouth daily.   Yes [provider]  nitroGLYCERIN (NITROSTAT) 0.4 MG SL tablet Place 1 tablet (0.4 mg total) under the tongue every 5 (five) minutes x 3 doses as needed for chest pain. 10/04/22  Yes Sarina Ill, DO  oxyCODONE (ROXICODONE) 5 MG immediate release tablet Take 1 tablet (5 mg total) by mouth every 8 (eight) hours as needed for up to 6 doses. 11/18/22  Yes Pilar Jarvis, MD  pantoprazole (PROTONIX) 20 MG tablet Take 1 tablet (20 mg total) by mouth daily. 10/04/22  Yes Sarina Ill, DO  QUEtiapine (SEROQUEL) 50 MG tablet Take 1 tablet (50 mg total) by mouth at bedtime. 10/04/22  Yes Sarina Ill, DO  albuterol (VENTOLIN HFA) 108 (90 Base) MCG/ACT inhaler Inhale 2 puffs into the lungs every 6 (six) hours as needed for wheezing or shortness of breath. 10/04/22 11/03/22  Sarina Ill, DO  benzonatate (TESSALON) 100 MG capsule Take 100 mg by mouth 3 (three) times daily as needed. Patient not taking: Reported on 11/15/2022 10/04/22   [provider]  dicyclomine (BENTYL) 20 MG tablet Take 1 tablet (20 mg total) by mouth every 6 (six) hours. Patient not taking: Reported on 11/15/2022 11/01/22   Irean Hong, MD  hydrOXYzine (ATARAX) 10 MG tablet Take 1 tablet (10 mg total) by mouth 3 (three) times daily as needed for anxiety. Patient not taking: Reported on 11/15/2022 10/04/22   Sarina Ill, DO     Physical Exam: BP 129/62   Pulse (!) 55   Temp 98.9 F (37.2 C) (Oral)   Resp 13   Ht 5\' 6"  (1.676 m)   Wt 95 kg   SpO2 100%   BMI 33.80 kg/m   General: 72 y.o. year-old male well developed well nourished in no acute distress.  Somnolent but arouses easily and answers questions appropriately. Cardiovascular: Regular rate and rhythm with no rubs or gallops.  No thyromegaly or JVD noted.  No lower extremity edema. 2/4 pulses in all 4 extremities. Respiratory: Clear to auscultation with no wheezes or rales. Good inspiratory effort. Abdomen: Soft nontender nondistended with normal bowel sounds x4 quadrants. Muskuloskeletal: No cyanosis, clubbing or edema noted bilaterally Neuro: CN II-XII intact, strength, sensation,  reflexes Skin: No ulcerative lesions noted or rashes Psychiatry: Judgement and insight appear normal. Mood is appropriate for condition and setting          Labs on Admission:  Basic Metabolic Panel: Recent Labs  Lab 12/23/22 1814  NA 139  K 4.1  CL 103  CO2 33*  GLUCOSE 117*  BUN 30*  CREATININE 1.22  CALCIUM 9.2   Liver Function Tests: Recent Labs  Lab 12/23/22 2314  AST 36  ALT 19  ALKPHOS 92  BILITOT 1.3*  PROT 5.8*  ALBUMIN 2.4*   No results for input(s): "LIPASE", "AMYLASE" in the last 168 hours. No results for input(s): "AMMONIA" in the last 168 hours. CBC: Recent Labs  Lab 12/23/22 1814  WBC 5.4  HGB 11.6*  HCT 35.3*  MCV 93.4  PLT 115*   Cardiac Enzymes: No results for input(s): "CKTOTAL", "CKMB", "CKMBINDEX", "TROPONINI" in the last 168 hours.  BNP (last 3 results) Recent Labs    09/13/22 2306 09/22/22 1616 12/23/22 1814  BNP 508.4* 473.7* 293.3*    ProBNP (last 3 results) No results for input(s): "PROBNP" in the last 8760 hours.  CBG: No results for input(s): "GLUCAP" in the last 168 hours.  Radiological Exams on Admission: CT Angio Chest/Abd/Pel for Dissection W and/or Wo Contrast  Result Date:  12/24/2022 CLINICAL DATA:  Chest pain radiating to the left arm EXAM: CT ANGIOGRAPHY CHEST, ABDOMEN AND PELVIS TECHNIQUE: Non-contrast CT of the chest was initially obtained. Multidetector CT imaging through the chest, abdomen and pelvis was performed using the standard protocol during bolus administration of intravenous contrast. Multiplanar reconstructed images and MIPs were obtained and reviewed to evaluate the vascular anatomy. RADIATION DOSE REDUCTION: This exam was performed according to the departmental dose-optimization program which includes automated exposure control, adjustment of the mA and/or kV according to patient size and/or use of iterative reconstruction technique. CONTRAST:  OMNIPAQUE IOHEXOL 350 MG/ML SOLN COMPARISON:  Chest x-ray 12/23/2022, CT abdomen pelvis 11/18/2022, CT chest 08/30/2022 FINDINGS: CTA CHEST FINDINGS Cardiovascular: Non contrasted images of the chest demonstrate no acute intramural hematoma. Nonaneurysmal aorta. No dissection is seen. Dilated pulmonary trunk measuring up to 4.5 cm. Central pulmonary arteries are also dilated. Coronary vascular calcification. Normal cardiac size. Trace pericardial effusion Mediastinum/Nodes: Midline trachea. No thyroid mass. No suspicious lymph nodes. Esophagus within normal limits. Five Lungs/Pleura: Scarring in the left upper lobe. No acute airspace disease, pleural effusion, or pneumothorax Musculoskeletal: Ununited upper sternal fracture. Thoracic alignment within normal limits. Review of the MIP images confirms the above findings. CTA ABDOMEN AND PELVIS FINDINGS VASCULAR Aorta: Normal caliber aorta without aneurysm, dissection, vasculitis or significant stenosis. Celiac: Patent without evidence of aneurysm, dissection, vasculitis or significant stenosis. SMA: Patent without evidence of aneurysm, dissection, vasculitis or significant stenosis. Replaced right hepatic artery Renals: Both renal arteries are patent without evidence of  aneurysm, dissection, vasculitis, fibromuscular dysplasia or significant stenosis. IMA: Patent without evidence of aneurysm, dissection, vasculitis or significant stenosis. Inflow: Patent without evidence of aneurysm, dissection, vasculitis or significant stenosis. Veins: Recanalized umbilical vein with large anterior intra-abdominal collateral vessels to the right common femoral vein as before. Review of the MIP images confirms the above findings. NON-VASCULAR Hepatobiliary: Hepatic cirrhosis. Posteromedial right hepatic lobe lesion is more difficult to appreciate today, it measures approximately 3.2 cm compared with 2.7 cm previously. Status post cholecystectomy. Markedly enlarged extrahepatic common bile duct measuring up to 3 cm. Pancreas: No inflammatory changes. Spleen: Borderline to slightly enlarged at 13.4 cm Adrenals/Urinary Tract: Adrenal  glands are normal. Kidneys show no hydronephrosis. Areas of cortical scarring. Small nonobstructing lower pole stones on the right. The bladder is unremarkable. Stomach/Bowel: The stomach is nonenlarged. Slightly thick-walled appearance of the stomach but under distended. No dilated small bowel. No acute bowel wall thickening. Lymphatic: No suspicious lymph nodes Reproductive: Prostate is unremarkable. Other: Negative for pelvic effusion or free air. Subcutaneous edema consistent with anasarca Musculoskeletal: No acute or suspicious osseous abnormality. Review of the MIP images confirms the above findings. IMPRESSION: 1. Negative for acute aortic dissection or aneurysm. 2. Dilated pulmonary trunk and central pulmonary arteries consistent with pulmonary arterial hypertension. 3. Hepatic cirrhosis with evidence for portal hypertension as before. Posteromedial right hepatic lobe lesion is more difficult to appreciate today, it measures approximately 3.2 cm based on contour deformity compared with 2.7 cm previously. Reference MRI 08/31/2022 for additional recommendation. 4.  Status post cholecystectomy. Markedly enlarged extrahepatic common bile duct measuring up to 3 cm, unchanged compared with most recent previous exams. 5. Nonobstructing right kidney stones. Electronically Signed   By: Jasmine Pang M.D.   On: 12/24/2022 01:28   CT HEAD WO CONTRAST ( )  Result Date: 12/24/2022 CLINICAL DATA:  Headache, new onset (Age >= 51y); Neck trauma (Age >= 65y) Pt here with cp that started yesterday. Pt states pain is centered and radiates to his left arm. Pt states pain is constant and is sharp and stabbing. EXAM: CT HEAD WITHOUT CONTRAST CT CERVICAL SPINE WITHOUT CONTRAST TECHNIQUE: Multidetector CT imaging of the head and cervical spine was performed following the standard protocol without intravenous contrast. Multiplanar CT image reconstructions of the cervical spine were also generated. RADIATION DOSE REDUCTION: This exam was performed according to the departmental dose-optimization program which includes automated exposure control, adjustment of the mA and/or kV according to patient size and/or use of iterative reconstruction technique. COMPARISON:  CT head and C-spine 08/12/2022 FINDINGS: CT HEAD FINDINGS Brain: No evidence of large-territorial acute infarction. No parenchymal hemorrhage. No mass lesion. No extra-axial collection. No mass effect or midline shift. No hydrocephalus. Basilar cisterns are patent. Vascular: No hyperdense vessel. Skull: No acute fracture or focal lesion. Sinuses/Orbits: Paranasal sinuses and mastoid air cells are clear. The orbits are unremarkable. Other: Patient is edentulous. CT CERVICAL SPINE FINDINGS Alignment: Normal. Skull base and vertebrae: Multilevel moderate severe degenerative changes spine with associated multilevel moderate severe osseous neural foraminal stenosis. Posterior disc osteophyte complex formations at the C4-C5, C5-C6, C6-C7 levels. No severe osseous central canal stenosis. No acute fracture. No aggressive appearing focal osseous  lesion or focal pathologic process. Soft tissues and spinal canal: No prevertebral fluid or swelling. No visible canal hematoma. Upper chest: Unremarkable. Other: Atherosclerotic plaque of the aortic arch. Atherosclerotic plaque of the carotid arteries within the neck. IMPRESSION: 1. No acute intracranial abnormality. 2. No acute displaced fracture or traumatic listhesis of the cervical spine. 3. Multilevel moderate severe degenerative changes spine with associated multilevel moderate severe osseous neural foraminal stenosis-most severe at the left C5-C6 level. Electronically Signed   By: Tish Frederickson M.D.   On: 12/24/2022 01:20   CT Cervical Spine Wo Contrast  Result Date: 12/24/2022 CLINICAL DATA:  Headache, new onset (Age >= 51y); Neck trauma (Age >= 65y) Pt here with cp that started yesterday. Pt states pain is centered and radiates to his left arm. Pt states pain is constant and is sharp and stabbing. EXAM: CT HEAD WITHOUT CONTRAST CT CERVICAL SPINE WITHOUT CONTRAST TECHNIQUE: Multidetector CT imaging of the head and cervical spine was  performed following the standard protocol without intravenous contrast. Multiplanar CT image reconstructions of the cervical spine were also generated. RADIATION DOSE REDUCTION: This exam was performed according to the departmental dose-optimization program which includes automated exposure control, adjustment of the mA and/or kV according to patient size and/or use of iterative reconstruction technique. COMPARISON:  CT head and C-spine 08/12/2022 FINDINGS: CT HEAD FINDINGS Brain: No evidence of large-territorial acute infarction. No parenchymal hemorrhage. No mass lesion. No extra-axial collection. No mass effect or midline shift. No hydrocephalus. Basilar cisterns are patent. Vascular: No hyperdense vessel. Skull: No acute fracture or focal lesion. Sinuses/Orbits: Paranasal sinuses and mastoid air cells are clear. The orbits are unremarkable. Other: Patient is  edentulous. CT CERVICAL SPINE FINDINGS Alignment: Normal. Skull base and vertebrae: Multilevel moderate severe degenerative changes spine with associated multilevel moderate severe osseous neural foraminal stenosis. Posterior disc osteophyte complex formations at the C4-C5, C5-C6, C6-C7 levels. No severe osseous central canal stenosis. No acute fracture. No aggressive appearing focal osseous lesion or focal pathologic process. Soft tissues and spinal canal: No prevertebral fluid or swelling. No visible canal hematoma. Upper chest: Unremarkable. Other: Atherosclerotic plaque of the aortic arch. Atherosclerotic plaque of the carotid arteries within the neck. IMPRESSION: 1. No acute intracranial abnormality. 2. No acute displaced fracture or traumatic listhesis of the cervical spine. 3. Multilevel moderate severe degenerative changes spine with associated multilevel moderate severe osseous neural foraminal stenosis-most severe at the left C5-C6 level. Electronically Signed   By: Tish Frederickson M.D.   On: 12/24/2022 01:20   DG Chest 2 View  Result Date: 12/23/2022 CLINICAL DATA:  Chest pain EXAM: CHEST - 2 VIEW COMPARISON:  12/11/2022 FINDINGS: The heart size and mediastinal contours are within normal limits. Both lungs are clear. The visualized skeletal structures show degenerative change of the thoracic spine. Healing rib fractures are noted on the left anteriorly. IMPRESSION: No acute abnormality noted. Electronically Signed   By: Alcide Clever M.D.   On: 12/23/2022 19:26    EKG: I independently viewed the EKG done and my findings are as followed: Junctional rhythm rate of 64.  Nonspecific ST-T changes.  QTc 406.  Assessment/Plan Present on Admission:  Syncope  Principal Problem:   Syncope Syncope, rule out cardiogenic etiology Elevated troponin, suspect demand ischemia. Troponin peaked at 27 and down trended.   Twelve-lead EKG with no evidence of acute ischemia. Obtain orthostatic vital  signs Monitor on telemetry Follow 2D echo PT OT evaluation Fall precautions. Consider cardiology consult for possible ambulatory telemetry at discharge.  Hyperbilirubinemia, likely 2/2 to hepatic cirrhosis seen on CT scan CT evidence of portal hypertension Total bili 1.3 Repeat CMP in the morning Avoid hepatotoxic agents.  Hypoalbuminemia Albumin 2.4 Monitor for now  Chronic normocytic anemia Hemoglobin at baseline 11.6 No reported overt bleeding. Continue to monitor H&H  Hyperlipidemia Resume home Lipitor  GERD Resume home Protonix  Chronic anxiety/depression Resume home regimen.  History of type 2 diabetes Last hemoglobin A1c 4.8 on 09/23/2022, from 7.7 on 10/04/2021. Obtain hemoglobin A1c Start insulin sliding scale.   DVT prophylaxis: Subcu Lovenox daily  Code Status: Full code  Family Communication: None at bedside  Disposition Plan: Admitted to telemetry cardiac unit  Consults called: None.  Admission status: Observation status.   Status is: Observation    Darlin Drop MD [facility] Pager [phone]  If 7PM-7AM, please contact night-coverage [email or web address] Password [email or web address]  12/24/2022, 2:02 AM

## 2022-12-24 NOTE — Discharge Instructions (Addendum)
Food Resources  Agency Name: [facility] Agency Address: [address], [place], [place] [address] Phone: [phone] Website: www.[email or web address].org  Service(s) Offered: Housing services, self-sufficiency, congregate meal  program, weatherization program, Administrator, sports program, emergency food assistance,  housing counseling, home ownership program, wheels -towork program. Meals free for 60 and older at various  locations from 9am-1pm, Monday-Friday:  [employer], [place], [place], [address], [place]   [facility], [place] [phone] The [address], [address]   [place], [place]  Agency Name: [facility] Address: [place] [address] [place], [place], [place] [address] Phone: [phone] Website: www.[email or web address] Service(s) Offered: Home delivered hot, frozen, and emergency  meals. Grocery assistance program which matches  volunteers one-on-one with seniors unable to grocery shop  for themselves. Must be 60 years and older; less than 20  hours of in-home aide service, limited or no driving ability;  live alone or with someone with a disability; live in  [place].  Agency Name: Software engineer ([place]) Address: [address] [place], [place] [address] Phone: [phone] Service(s) Offered: Food is served to [place], homeless, elderly, and low  income people in the community every Saturday (11:30  am-12:30 pm) and Sunday (12:30 pm-1:30pm). Volunteers  also offer help and encouragement in seeking employment,  and spiritual guidance. December 20, 2016 8  Agency Name: Department of Social Services Address: [address] [place], [place] [address] Phone: [phone] Service(s) Offered: Child support services; child welfare services; food stamps;  Medicaid;  work first family assistance; and aid with fuel,  rent, food and medicine.  Agency Name: Chemical engineer Address: [address], [place], [place] Phone: [phone] Website: [email or web address].com Services Offered: Monday 10:00am-12:00, 8:00pm-9:00pm, and Friday  10:00am-12:00. Agency Name: [employer] [facility] [place] Address: [address] [place], [place] [address] Phone: [phone] Website: www.[email or web address].org Service(s) Offered: Serves weekday meals, open from 11:30 am- 1:00 pm., and  6:30-7:30pm, Monday-Wednesday-Friday distributes food  3:30-6pm, Monday-Wednesday-Friday.  Agency Name: [employer] Address: [address] [place], [place] Phone: [phone] Website: [email or web address] Services Offered: Distributes food the 4th Saturday of the month, starting at  8:00 am Agency Name: [facility] Address: [id number] [place], [place] [address] Phone: [phone] Website: [email or web address] Service(s) Offered: Bread of life, weekly food pantry. Open Wednesdays from  10:00am-noon.  Agency Name: [facility] Address: [place], [place], [place] Phone: [phone] Services Offered: Distributes food 9am-1pm, Monday-Thursday. Call for details.   Agency Name: [facility] Address: [address], [place], [place] [address] Phone: [phone] Website: [email or web address].[email or web address] Service(s) Offered: Youth worker. Call for assistance. Agency Name: [facility] Address: [address] [place], [place] [address] Phone: [phone] Service Offered: Emergency Food Pantry. Call for appointment.  Agency Name: [place] Address: [address], [place], [place] [address] Phone: [phone] Website: [email or web address] Services Offered: Youth worker. Call for details Agency Name: [facility] Address: [place], [place] Phone: [phone] Website: [email or web address] Service(s) Offered: Emergency Food Pantry. Call for details.  Agency Name: Solicitor Address: [place]. [address], [place],  [address] Phone: [phone] or [phone] Website: [email or web address] Service(s) Offered: Distribute food 9am-11:30 am, Tuesday-Friday, and 1- 3:30pm, Monday-Friday. Food pantry Monday-Friday  1pm-3pm, fresh items, Mon.-Wed.-Fri.  Agency Name: [facility] Address: [phone]  Kathie [address] [place], [place] [address] Phone: [phone] Website: www.safealamance.org Services Offered: Distribute food Tues and Sats from 9:00am-noon. Closed  1st Saturday of each month. Call for details      Rent/Utility/Housing  Agency Name: [facility] Agency Address: [address] [place], [place] [address] Phone: [phone] Email: [email or web address] Website: www.[email or web address] Service(s) Offered: Housing services, self-sufficiency, congregate meal  program, weatherization program, Field seismologist program, emergency food assistance,  housing counseling, home ownership program, wheels -towork program.  Agency Name: [facility] Address: [address] [place], [place] [address] Phone: [phone] (8a-4p) [phone] (8p- 10p) Email: [email or web address] Website: [email or web address] Service(s) Offered: A program for homeless and/or needy men that includes one-on-one counseling, life skills training and job rehabilitation.  Agency Name: [employer] [facility] [place] Address: [address] [place], [place] [address] Phone: [phone] Website: www.alliedchurches.org Service(s) Offered: Assistance to needy in emergency with utility bills, heating  fuel, and prescriptions. Shelter for homeless 7pm-7am. December 20, 2016 15  Agency Name:  [facility] [place] (Developmentally Disabled) Address: [address], [place], [place] [address] Phone: [phone] Contact Person: Cathleen Corti Email: [email or web address] Website: [email or web address] Service(s) Offered: Helps individuals with developmental disabilities move  from housing that is more restrictive to homes where they  can achieve greater independence and have more  opportunities.  Agency Name: [facility] Address: [address], [place], [place] [address] Phone: [phone] Email: [email or web address] Website: www.[email or web address].org Service(s) Offered: Provides affordable housing for low-income families,  elderly, and disabled individuals. Offer a wide range of  programs and services, from financial planning to afterschool and summer programs.  Agency Name: Department of Social Services Address: [address] [place], [place] [address] Phone: [phone] Service(s) Offered: Child support services; child welfare services; food stamps;  Medicaid; work first family assistance; and aid with fuel,  rent, food and medicine.  Agency Name: [facility] [place] [employer]. Address: [facility] [address] [place], [place]  [address] Phone: [phone] Website: www.familyabuseservices.org Service(s) Offered: 24 hour Crisis Line: [phone]; 24 hour Emergency Shelter;  Transitional Housing; Support Groups; Scientist, physiological;  [employer]; Hispanic Outreach: [phone];  Visitation Center: [phone]. December 20, 2016 16  Agency Name: [facility] [place]. Address: [address], [place], [place] [address] Phone: [phone] Service(s) Offered: CAP Services; Home and [employer]; Individual  or Group Supports; Respite Care Non-Institutional Nursing;  Residential Supports; Respite Care and Personal Care  Services; Transportation; Family and Friends Night;  Recreational Activities; Three  Nutritious Meals/Snacks;  Consultation with Registered Dietician; Twenty-four hour  Registered Nurse Access; Daily and [employer]; Camp [place]aves; [place] for the [employer] (During Summer Months) Bingo Night (Every  Wednesday Night); Special Populations Dance Night  (Every Tuesday Night); Professional Hair Care Services.  Agency Name: [facility] Address: [address], [place], [place] [address] Phone: [phone] Contact Person: Jabier Mutton Website: [email or web address].Physicist, medical) Offered: Residential treatment facility for women; food and  clothing, educational & employment development and  transportation to work; Counsellor of financial skills;  parenting and family reunification; emotional and spiritual  support; transitional housing for program graduates.  Agency Name: [facility] Address: [address], [place], [place] [address] Phone: [phone] Email: [email or web address] Website: TaskTown.es Service(s) Offered: Public housing units for elderly, disabled, and low income  people; housing choice vouchers for income eligible  applicants; shelter plus care vouchers; and [employer] program. December 20, 2016 17  Agency  Name: [facility] Address: [address], [place], [place] [address] Phone: [phone] Email: [email or web address]@[email or web address] Website: www.habitatalamance.org Service(s) Offered: Build houses for families in need of decent housing. Each  adult in the family must invest 200 hours of labor on  someone else's house, work with volunteers to build their  own house, attend classes on budgeting, home maintenance, yard care, and attend homeowner association  meetings.  Agency Name: [facility] Address: [address], [place], [place] [address] Phone: [phone] Website: www.rsli.org Service(s) Offered: Intermediate care  facilities for mentally retarded,  Supervised Living in group homes for adults with  developmental disabilities, Supervised Living for people  who have dual diagnoses (MRMI), Independent Living,  Supported Living, respite and a variety of CAP services,  pre-vocational services, day supports, and [employer].  Agency Name: [place] [employer] Phone: [phone] Website: www.NCForeclosurePrevention.gov Service(s) Offered: Zero-interest, deferred loans to homeowners struggling to  pay their mortgage. Call for more information      [facility] [address], [place], [place] [address] [phone] 3-day special: $140 (weekdays), $150 (weekends). Weekly special: [address], [place], [place] [address] [phone] Weekly: $337.96 for one person + $25 pet fee if applicable  [employer] [address], [place], [place] [address] [phone] Weekly rate for queen-sized bed: $368    Transportation Agency Name: [facility] Agency Address: [address] [place], [place] [address] Phone: [phone] Email: [email or web address] Website: www.alamanceservices.org Service(s) Offered: Family [employer], self-sufficiency, congregate meal  program, weatherization program, Field seismologist program, emergency food assistance,  housing counseling, home ownership program, wheels-towork program. December 20, 2016 22  Agency Name: [facility] [phone]) Address: [address], [place], [place] [address] Phone: [phone] Email:  Website: www.acta-[email or web address].com Service(s) Offered: Transportation for general public, subscription and demand  response; Dial-a-Ride for citizens 58 years of age or older. Agency Name: Department of Social Services Address: [address] [place], [place] [address] Phone: [phone] Service(s) Offered: Child support services;  child welfare services; food stamps;  Medicaid; work first family assistance; and aid with fuel,  rent, food and medicine, transportation assistance.  Agency Name: Disabled [employer] (DAV) Transportation  Network Address: Phone: [phone] Service(s) Offered: Transports veterans to the [facility] medical center. Call  forty-eight hours in advance and leave the name, telephone  number, date, and time of appointment. Veteran will be  contacted by the driver the day before the appointment to  arrange a pick up point

## 2022-12-24 NOTE — Assessment & Plan Note (Signed)
Chronic in nature likely with liver cirrhosis and hepatitis B.

## 2022-12-25 ENCOUNTER — Telehealth (HOSPITAL_COMMUNITY): Payer: Self-pay | Admitting: Pharmacy Technician

## 2022-12-25 ENCOUNTER — Other Ambulatory Visit (HOSPITAL_COMMUNITY): Payer: Self-pay

## 2022-12-25 DIAGNOSIS — R0781 Pleurodynia: Secondary | ICD-10-CM | POA: Diagnosis not present

## 2022-12-25 DIAGNOSIS — K7469 Other cirrhosis of liver: Secondary | ICD-10-CM | POA: Diagnosis not present

## 2022-12-25 DIAGNOSIS — I951 Orthostatic hypotension: Secondary | ICD-10-CM | POA: Diagnosis not present

## 2022-12-25 DIAGNOSIS — K7682 Hepatic encephalopathy: Secondary | ICD-10-CM | POA: Diagnosis not present

## 2022-12-25 LAB — CBC
HCT: 28.8 % — ABNORMAL LOW (ref 39.0–52.0)
Hemoglobin: 9.8 g/dL — ABNORMAL LOW (ref 13.0–17.0)
MCH: 31.4 pg (ref 26.0–34.0)
MCHC: 34 g/dL (ref 30.0–36.0)
MCV: 92.3 fL (ref 80.0–100.0)
Platelets: 76 10*3/uL — ABNORMAL LOW (ref 150–400)
RBC: 3.12 MIL/uL — ABNORMAL LOW (ref 4.22–5.81)
RDW: 14.6 % (ref 11.5–15.5)
WBC: 1.9 10*3/uL — ABNORMAL LOW (ref 4.0–10.5)
nRBC: 0 % (ref 0.0–0.2)

## 2022-12-25 LAB — BASIC METABOLIC PANEL
Anion gap: 4 — ABNORMAL LOW (ref 5–15)
BUN: 29 mg/dL — ABNORMAL HIGH (ref 8–23)
CO2: 27 mmol/L (ref 22–32)
Calcium: 8.2 mg/dL — ABNORMAL LOW (ref 8.9–10.3)
Chloride: 107 mmol/L (ref 98–111)
Creatinine, Ser: 1.13 mg/dL (ref 0.61–1.24)
GFR, Estimated: >60 mL/min (ref >60)
Glucose, Bld: 113 mg/dL — ABNORMAL HIGH (ref 70–99)
Potassium: 4 mmol/L (ref 3.5–5.1)
Sodium: 138 mmol/L (ref 135–145)

## 2022-12-25 LAB — MAGNESIUM: Magnesium: 1.6 mg/dL — ABNORMAL LOW (ref 1.7–2.4)

## 2022-12-25 LAB — AMMONIA: Ammonia: 289 umol/L — ABNORMAL HIGH (ref 9–35)

## 2022-12-25 LAB — FERRITIN: Ferritin: 28 ng/mL (ref 24–336)

## 2022-12-25 LAB — GLUCOSE, CAPILLARY
Glucose-Capillary: 101 mg/dL — ABNORMAL HIGH (ref 70–99)
Glucose-Capillary: 119 mg/dL — ABNORMAL HIGH (ref 70–99)

## 2022-12-25 LAB — CORTISOL-AM, BLOOD: Cortisol - AM: 4.7 ug/dL — ABNORMAL LOW (ref 6.7–22.6)

## 2022-12-25 MED ORDER — ONDANSETRON HCL 4 MG/2ML IJ SOLN
4.0000 mg | Freq: Four times a day (QID) | INTRAMUSCULAR | Status: DC | PRN
  Administered 2022-12-25: 4 mg via INTRAVENOUS
  Filled 2022-12-25: qty 2

## 2022-12-25 MED ORDER — COSYNTROPIN 0.25 MG IJ SOLR
0.2500 mg | Freq: Once | INTRAMUSCULAR | Status: AC
  Administered 2022-12-26: 0.25 mg via INTRAVENOUS
  Filled 2022-12-25: qty 0.25

## 2022-12-25 MED ORDER — RIFAXIMIN 550 MG PO TABS
550.0000 mg | ORAL_TABLET | Freq: Two times a day (BID) | ORAL | Status: DC
  Administered 2022-12-25 – 2022-12-27 (×5): 550 mg via ORAL
  Filled 2022-12-25 (×5): qty 1

## 2022-12-25 MED ORDER — MIDODRINE HCL 5 MG PO TABS
5.0000 mg | ORAL_TABLET | Freq: Three times a day (TID) | ORAL | Status: DC
  Administered 2022-12-25 – 2022-12-26 (×5): 5 mg via ORAL
  Filled 2022-12-25 (×5): qty 1

## 2022-12-25 MED ORDER — MAGNESIUM SULFATE 2 GM/50ML IV SOLN
2.0000 g | Freq: Once | INTRAVENOUS | Status: AC
  Administered 2022-12-25: 2 g via INTRAVENOUS
  Filled 2022-12-25: qty 50

## 2022-12-25 MED ORDER — LACTULOSE 10 GM/15ML PO SOLN
20.0000 g | Freq: Every day | ORAL | Status: DC
  Administered 2022-12-25 – 2022-12-26 (×2): 20 g via ORAL
  Filled 2022-12-25 (×3): qty 30

## 2022-12-25 MED ORDER — LACTULOSE 10 GM/15ML PO SOLN: 20.0000 g | Freq: Two times a day (BID) | ORAL | Status: DC

## 2022-12-25 NOTE — TOC Benefit Eligibility Note (Signed)
Patient Product/process development scientist completed.    The patient is currently admitted and upon discharge could be taking Xifaxan 550 mg.  Prior Authorization Required  The patient is insured through [employer] Part D   This test claim was processed through [facility]- copay amounts may vary at other pharmacies due to pharmacy/plan contracts, or as the patient moves through the different stages of their insurance plan.  Roland Earl, CPHT Pharmacy Patient Advocate Specialist [facility] Patient Advocate Team Direct Number: [phone]  Fax: [phone]

## 2022-12-25 NOTE — Telephone Encounter (Signed)
Pharmacy Patient Advocate Encounter  Insurance verification completed.    The patient is insured through [employer] Part D   The patient is currently admitted and ran test claims for the following: Xifaxan.  Copays and coinsurance results were relayed to Inpatient clinical team.

## 2022-12-25 NOTE — Evaluation (Addendum)
Physical Therapy Evaluation Patient Details Name: [name] MRN: [id number] DOB: 1950-12-29 Today's Date: 12/25/2022  History of Present Illness  [name] is a 71yoM who [facility] on 12/23/22 c 4d CP, worse with exertion. Pt also reports an episode of syncope in this time frame. PMH: HTN, DM2, dCHF, homelessness. Per CSW pt plans to return to his tent at [place].  Clinical Impression  Pt finishing meal on entry, agreeable to evaluation. Started with orthostatic vitals- abnormal findings, remains generally hypotensive and bradycardic with down trending gradual pressures. His orthostatic dizziness is at baseline today, which is to say, only 1-2 minutes from supine to sitting, none thereafter. Pt assisted to BR per request, cautioned against valsalva while on the commode. Pt is able to slowly AMB around the unit without any abnormal symptoms. HR gets into 60s with walking (67bpm), otherwise in 50s throughout my assessment. No CP or SOB today. His acute back pain is improved with analgesics. His chronic knee pain remains limiting as it typical. Pt left in bed at end of session, all needs met.  *of note: later review of hospital notes from past 12 months show trend for lower HR toward bradycardia, typically in 60s bpm, more recently in 50s bpm.    12/25/22 1010  Therapy Vitals  Patient Position (if appropriate) Orthostatic Vitals  Orthostatic Lying   BP- Lying 105/52  Pulse- Lying 51  Orthostatic Sitting  BP- Sitting 102/52  Pulse- Sitting 52  Orthostatic Standing at 0 minutes  BP- Standing at 0 minutes 91/52  Pulse- Standing at 0 minutes 54  Orthostatic Standing at 3 minutes  BP- Standing at 3 minutes (!) 89/51  Pulse- Standing at 3 minutes 56       Recommendations for follow up therapy are one component of a multi-disciplinary discharge planning process, led by the attending physician.  Recommendations may be updated based on patient status, additional functional criteria and  insurance authorization.  Follow Up Recommendations       Assistance Recommended at Discharge Set up Supervision/Assistance  Patient can return home with the following  Assist for transportation;Assistance with cooking/housework;Help with stairs or ramp for entrance    Equipment Recommendations None recommended by PT  Recommendations for Other Services       Functional Status Assessment Patient has had a recent decline in their functional status and demonstrates the ability to make significant improvements in function in a reasonable and predictable amount of time.     Precautions / Restrictions Precautions Precautions: Fall Restrictions Weight Bearing Restrictions: No      Mobility  Bed Mobility Overal bed mobility: Modified Independent                  Transfers Overall transfer level: Modified independent Equipment used: Straight cane               General transfer comment: normal postural sway for 2-3 minutes standing for BP assessment.    Ambulation/Gait Ambulation/Gait assistance: Min guard Gait Distance (Feet): 210 Feet Assistive device: Straight cane         General Gait Details: slow, cautious, no frank lob, feels close to baseline; already seen to be orthostatic prior to gait inititation. HR at 67 upon final 63ft of AMB.  Stairs            Wheelchair Mobility    Modified Rankin (Stroke Patients Only)       Balance  Pertinent Vitals/Pain Pain Assessment Pain Assessment: Faces Faces Pain Scale: Hurts little more Pain Location: Back pain from recent fall (syncope), better after analgesia, but still having chronic knee pain when up Pain Descriptors / Indicators: Constant, Sore Pain Intervention(s): Limited activity within patient's tolerance, Monitored during session, Repositioned    Home Living Family/patient expects to be discharged to:: Shelter/Homeless                    Additional Comments: lives in a tent behind [place] since 2016    Prior Function Prior Level of Function : Independent/Modified Independent             Mobility Comments: walks with a rollator or SPC in the community. takes the bus for transportation. Reports wearing a knee brace for stability. Only tolerates 20-54ft bouts AMB at baseline with resting breaks due to COPD,  Just got a rollator recently. ADLs Comments: Mod I, uses a bed side commode in the tent for toileting     Hand Dominance   Dominant Hand: Right    Extremity/Trunk Assessment   Upper Extremity Assessment Upper Extremity Assessment: Overall WFL for tasks assessed    Lower Extremity Assessment Lower Extremity Assessment: Generalized weakness;RLE deficits/detail RLE Deficits / Details: Pt wears R knee brace purchased from [employer]. He endorses wearing this to keep "my knee from going all over the place when I walk"       Communication   Communication: No difficulties  Cognition Arousal/Alertness: Awake/alert Behavior During Therapy: WFL for tasks assessed/performed Overall Cognitive Status: Within Functional Limits for tasks assessed                                          General Comments      Exercises     Assessment/Plan    PT Assessment Patient needs continued PT services  PT Problem List Decreased strength;Decreased range of motion;Decreased activity tolerance;Decreased balance;Decreased mobility;Decreased coordination;Decreased cognition;Decreased knowledge of use of DME;Decreased safety awareness;Decreased knowledge of precautions       PT Treatment Interventions DME instruction;Gait training;Stair training;Functional mobility training;Therapeutic activities;Therapeutic exercise;Balance training;Neuromuscular re-education;Patient/family education    PT Goals (Current goals can be found in the Care Plan section)  Acute Rehab PT Goals Patient Stated Goal:  hopes to avoid readmission PT Goal Formulation: With patient Time For Goal Achievement: 01/08/23 Potential to Achieve Goals: Fair    Frequency Min 3X/week     Co-evaluation               AM-PAC PT "6 Clicks" Mobility  Outcome Measure Help needed turning from your back to your side while in a flat bed without using bedrails?: None Help needed moving from lying on your back to sitting on the side of a flat bed without using bedrails?: None Help needed moving to and from a bed to a chair (including a wheelchair)?: None Help needed standing up from a chair using your arms (e.g., wheelchair or bedside chair)?: None Help needed to walk in hospital room?: A Little Help needed climbing 3-5 steps with a railing? : A Little 6 Click Score: 22    End of Session Equipment Utilized During Treatment: Gait belt Activity Tolerance: Patient tolerated treatment well;No increased pain;Patient limited by fatigue Patient left: in bed;with call bell/phone within reach;with family/visitor present Nurse Communication: Mobility status PT Visit Diagnosis: Unsteadiness on feet (R26.81);Difficulty in walking, not elsewhere classified (  R26.2);Muscle weakness (generalized) (M62.81)    Time: 9604-5409 PT Time Calculation (min) (ACUTE ONLY): 30 min   Charges:   PT Evaluation $PT Eval Moderate Complexity: 1 Mod PT Treatments $Therapeutic Activity: 8-22 mins       10:46 AM, 12/25/22 Rosamaria Lints, PT, DPT Physical Therapist - [facility]  [phone] (ASCOM)    Beya Tipps C 12/25/2022, 10:43 AM

## 2022-12-25 NOTE — Progress Notes (Signed)
Progress Note   Patient: [name]:[id number] DOB: [birth year] DOA: 12/23/2022     1 DOS: the patient was seen and examined on 12/25/2022   Brief hospital course: 72 y.o. male with medical history significant for essential hypertension, type 2 diabetes, chronic diastolic CHF, homelessness, who presented to [facility] ED with complaints of chest pain and syncope.  Endorses intermittent stabbing centrally located chest pain for the past 4 days, worse with exertion.  The patient was walking at [facility] when he felt chest pain and then passed out.  EMS was activated.  En route the patient received full dose aspirin 324 mg x 1 and 1 dose of nitroglycerin.  The patient is a poor historian.  At the time of this visit, the patient states the pain feels like pressure.  It is centrally located and does not radiate.   In the ED, EKG shows junctional rhythm.  Labs studies notable for mildly elevated troponin, peaked at 27 and trended down.  12/24/2022.  Patient orthostatic.  IV fluid bolus and gentle IV fluids given.  Patient does have a history of congestive heart failure. 4/30.  Patient's ammonia level high at 289.  Patient states that he gets so much diarrhea with the lactulose only can tolerate small amount.  Xifaxan prior approval by pharmacist.  Patient's a.m. cortisol level low at 4.7 so we will need to get a cosyntropin test tomorrow morning.  Started on midodrine for orthostatic hypotension  Assessment and Plan: * Orthostatic hypotension Continue gentle IV fluids.  Starting midodrine today.  Will get cosyntropin test for low cortisol level.  Acute hepatic encephalopathy (HCC) Ammonia level 289.  Pharmacist able to get right for mixing approved with insurance company.  Patient hesitant on dosing of lactulose.  Will dose once a day for now.  Liver cirrhosis (HCC) Seen on prior imaging, pancytopenia, acute hepatic encephalopathy, slight coagulopathy  Rib pain on right side Rib x-ray does not show  any fracture.  As needed oxycodone.  Junctional rhythm Junctional rhythm ruled out.  Patient sinus bradycardia on monitor.  Cardiology reviewed EKG and believes that sinus rhythm also.  Hyperlipidemia, unspecified Continue Lipitor  Thrombocytopenia (HCC) Chronic in nature likely with liver cirrhosis and hepatitis B.  Obesity (BMI 30-39.9) BMI 33.80 with current height and weight in computer.  Myocardial ischemia Troponin only slightly elevated.  Hepatitis B infection Refer to gastroenterology as outpatient.  Normocytic anemia Add on ferritin for further evaluation of anemia.        Subjective: Patient not feeling so good.  Patient orthostatic.  Added midodrine today.  Patient's ammonia level 289.  Physical Exam: Vitals:   12/25/22 0329 12/25/22 0340 12/25/22 0808 12/25/22 1345  BP: (!) 95/42 126/72 (!) 105/55 (!) 114/58  Pulse: (!) 52  (!) 51 (!) 51  Resp: 18  (!) 25 (!) 25  Temp: 97.9 F (36.6 C)  97.6 F (36.4 C) 97.8 F (36.6 C)  TempSrc: Oral  Oral   SpO2: 96%  96% 98%  Weight:      Height:       Physical Exam HENT:     Head: Normocephalic.     Mouth/Throat:     Pharynx: No oropharyngeal exudate.  Eyes:     General: Lids are normal.     Conjunctiva/sclera: Conjunctivae normal.  Cardiovascular:     Rate and Rhythm: Regular rhythm. Bradycardia present.     Heart sounds: Normal heart sounds, S1 normal and S2 normal.  Pulmonary:  Breath sounds: No decreased breath sounds, wheezing, rhonchi or rales.  Chest:     Comments: Right posterior rib pain Abdominal:     Palpations: Abdomen is soft.     Tenderness: There is no abdominal tenderness.  Musculoskeletal:     Right lower leg: Swelling present.     Left lower leg: Swelling present.  Skin:    General: Skin is warm.     Findings: No rash.  Neurological:     Mental Status: He is alert and oriented to person, place, and time.     Data Reviewed: Ammonia level 289, creatinine 1.13, white blood  cell count 1.9, hemoglobin 9.8, platelet count 76.  A.m. cortisol 4.7.   Disposition: Status is: Inpatient Remains inpatient appropriate because: Patient has a high ammonia level, his a.m. cortisol is low we will have to do a cosyntropin test tomorrow.  Patient orthostatic and started on midodrine.  Planned Discharge Destination: Home    Time spent: 29 minutes  Author: Alford Highland, MD 12/25/2022 3:46 PM  For on call review [email or web address].

## 2022-12-25 NOTE — Telephone Encounter (Signed)
Patient Advocate Encounter   Received notification that prior authorization for Xifaxan 550MG  tablets is required.   PA submitted on 12/25/2022 Key [id number] Insurance [employer] Medicare Part D Electronic Prior Authorization Form Status is pending       Roland Earl, CPhT Pharmacy Patient Advocate Specialist [facility] Pharmacy Patient Advocate Team Direct Number: [phone]  Fax: [phone]

## 2022-12-25 NOTE — TOC Progression Note (Signed)
Transition of Care [facility]) - Progression Note    Patient Details  Name: [name] MRN: [id number] Date of Birth: Oct 23, 1950  Transition of Care [facility] CM/SW Contact  Truddie Hidden, RN Phone Number: 12/25/2022, 11:33 AM  Clinical Narrative:    Case reviewed no changes in discharge disposition.    Expected Discharge Plan:  (Tent) Barriers to Discharge: Continued Medical Work up  Expected Discharge Plan and Services     Post Acute Care Choice: NA Living arrangements for the past 2 months: Homeless                                       Social Determinants of Health (SDOH) Interventions SDOH Screenings   Food Insecurity: Food Insecurity Present (12/24/2022)  Housing: High Risk (12/24/2022)  Transportation Needs: Unmet Transportation Needs (12/24/2022)  Utilities: Not At Risk (12/24/2022)  Alcohol Screen: Low Risk  (09/24/2022)  Depression (PHQ2-9): High Risk (04/26/2022)  Financial Resource Strain: High Risk (04/27/2022)  Physical Activity: Sufficiently Active (04/16/2018)  Social Connections: Moderately Isolated (04/30/2018)  Stress: Stress Concern Present (04/16/2018)  Tobacco Use: Medium Risk (12/24/2022)    Readmission Risk Interventions    11/07/2022    1:45 PM  Readmission Risk Prevention Plan  Transportation Screening Complete  SW Recovery Care/Counseling Consult Complete  Palliative Care Screening Not Applicable  Skilled Nursing Facility Not Applicable

## 2022-12-25 NOTE — Evaluation (Signed)
Occupational Therapy Evaluation Patient Details Name: [name] MRN: [id number] DOB: 04/10/1951 Today's Date: 12/25/2022   History of Present Illness 72 y.o. male with medical history significant for essential hypertension, type 2 diabetes, chronic diastolic CHF, homelessness, who presented to [facility] ED with complaints of chest pain and syncope.  Endorses intermittent stabbing centrally located chest pain for the past 4 days, worse with exertion.In the ED, 12 lead EKG is non acute, no evidence of acute ischemia.  Labs studies notable for mildly elevated troponin, peaked at 27 and trended down.  Due to concern for possible cardiogenic syncope, pt admitted to telemetry cardiac unit.   Clinical Impression   Mr. Uselman was seen for OT evaluation this date. Pt reports living in a tent, using a BSC for toileting needs and sponge bathing at baseline. He uses a 4WW for functional mobility in the community as well as a SPC for shorter distances. Pt presents to OT services at or near baseline level of functional independence. He is able to perform bed/functional mobility with MOD I. See ADL section below for additional detail. No skilled OT needs identified at this time. Will sign off. Please re-consult if additional OT needs arise during this hospital stay.        Recommendations for follow up therapy are one component of a multi-disciplinary discharge planning process, led by the attending physician.  Recommendations may be updated based on patient status, additional functional criteria and insurance authorization.   Assistance Recommended at Discharge PRN  Patient can return home with the following      Functional Status Assessment  Patient has not had a recent decline in their functional status  Equipment Recommendations  None recommended by OT    Recommendations for Other Services       Precautions / Restrictions Precautions Precautions: Fall Restrictions Weight Bearing Restrictions: No       Mobility Bed Mobility Overal bed mobility: Modified Independent             General bed mobility comments: Good use of log-roll technique for bed mobility.    Transfers Overall transfer level: Modified independent Equipment used: Straight cane                      Balance Overall balance assessment: Mild deficits observed, not formally tested (Uses rollator at baseline.)                                         ADL either performed or assessed with clinical judgement   ADL Overall ADL's : At baseline                                       General ADL Comments: Pt presents at or near functional baseline for ADL Management. He performs bed/functional mobility in room with MOD I. MOD I to adjust pants in standing with SPC. No acute strenth, sensory, or cognitive deficits appreciated. Vitals stable on RA spO2 97%, HR 50. Pt endorses mild dizziness when coming to sit, but this resolves quickly. Denies adverse s/s with standing ADL management.     Vision Baseline Vision/History: 1 Wears glasses Patient Visual Report: No change from baseline       Perception     Praxis      Pertinent Vitals/Pain  Pain Assessment Pain Assessment: 0-10 Pain Score: 10-Worst pain ever Pain Location: Back pain Pain Descriptors / Indicators: Constant, Sore Pain Intervention(s): Limited activity within patient's tolerance, Monitored during session, Repositioned     Hand Dominance Right   Extremity/Trunk Assessment Upper Extremity Assessment Upper Extremity Assessment: Overall WFL for tasks assessed   Lower Extremity Assessment Lower Extremity Assessment: Generalized weakness;RLE deficits/detail RLE Deficits / Details: Pt wears R knee brace purchased from [employer]. He endorses wearing this to keep "my knee from going all over the place when I walk"       Communication Communication Communication: No difficulties   Cognition  Arousal/Alertness: Awake/alert Behavior During Therapy: WFL for tasks assessed/performed Overall Cognitive Status: Within Functional Limits for tasks assessed                                 General Comments: Pt oriented to self, place, situation. Date not assessed. Pleasant, conversational t/o session.     General Comments       Exercises Other Exercises Other Exercises: Pt educated on role of OT in acute setting, safe use of AE/DME for ADL  management, falls prevention strategies. Pt verbalizes understanding with good recall/carryover of prior educatio on AE/DME use.   Shoulder Instructions      Home Living Family/patient expects to be discharged to:: Shelter/Homeless                                 Additional Comments: lives in a tent behind [place]      Prior Functioning/Environment Prior Level of Function : Independent/Modified Independent             Mobility Comments: walks with a rollator or SPC in the community. takes the bus for transportation. Reports wearing a knee brace for stability. ADLs Comments: Mod I, uses a bed side commode in the tent for toileting        OT Problem List: Cardiopulmonary status limiting activity;Decreased activity tolerance;Decreased safety awareness;Impaired balance (sitting and/or standing)      OT Treatment/Interventions:      OT Goals(Current goals can be found in the care plan section) Acute Rehab OT Goals Patient Stated Goal: To go home OT Goal Formulation: All assessment and education complete, DC therapy Time For Goal Achievement: 12/25/22 Potential to Achieve Goals: Good  OT Frequency:      Co-evaluation              AM-PAC OT "6 Clicks" Daily Activity     Outcome Measure Help from another person eating meals?: None Help from another person taking care of personal grooming?: None Help from another person toileting, which includes using toliet, bedpan, or urinal?: None Help from  another person bathing (including washing, rinsing, drying)?: None Help from another person to put on and taking off regular upper body clothing?: None Help from another person to put on and taking off regular lower body clothing?: None 6 Click Score: 24   End of Session Equipment Utilized During Treatment: Gait belt;Other (comment) [facility]) Nurse Communication: Mobility status  Activity Tolerance: Patient tolerated treatment well Patient left: in bed;with call bell/phone within reach;with bed alarm set  OT Visit Diagnosis: Other abnormalities of gait and mobility (R26.89)                Time: 1610-9604 OT Time Calculation (min): 22 min Charges:  OT General  Charges $OT Visit: 1 Visit OT Evaluation $OT Eval Low Complexity: 1 Low OT Treatments $Self Care/Home Management : 8-22 mins  Rockney Ghee, M.S., OTR/L 12/25/22, 9:22 AM

## 2022-12-25 NOTE — Assessment & Plan Note (Signed)
Ammonia level 289.  Pharmacist able to get right for mixing approved with insurance company.  Patient hesitant on dosing of lactulose.  Will dose once a day for now.

## 2022-12-25 NOTE — Telephone Encounter (Signed)
Patient Advocate Encounter  Prior Authorization for Xifaxan 550MG  tablets  has been approved.    PA# [id number] Insurance [employer] Medicare Part D Electronic Prior Authorization Form  Effective dates: 12/25/2022 through 08/27/2023  Patients co-pay is $0.00.     Roland Earl, CPhT Pharmacy Patient Advocate Specialist [facility] Health Pharmacy Patient Advocate Team Direct Number: [phone]  Fax: [phone]

## 2022-12-26 DIAGNOSIS — K7469 Other cirrhosis of liver: Secondary | ICD-10-CM | POA: Diagnosis not present

## 2022-12-26 DIAGNOSIS — E2740 Unspecified adrenocortical insufficiency: Secondary | ICD-10-CM

## 2022-12-26 DIAGNOSIS — I951 Orthostatic hypotension: Secondary | ICD-10-CM | POA: Diagnosis not present

## 2022-12-26 DIAGNOSIS — K7682 Hepatic encephalopathy: Secondary | ICD-10-CM | POA: Diagnosis not present

## 2022-12-26 LAB — BASIC METABOLIC PANEL
Anion gap: 2 — ABNORMAL LOW (ref 5–15)
BUN: 25 mg/dL — ABNORMAL HIGH (ref 8–23)
CO2: 26 mmol/L (ref 22–32)
Calcium: 8 mg/dL — ABNORMAL LOW (ref 8.9–10.3)
Chloride: 110 mmol/L (ref 98–111)
Creatinine, Ser: 1.04 mg/dL (ref 0.61–1.24)
GFR, Estimated: >60 mL/min (ref >60)
Glucose, Bld: 92 mg/dL (ref 70–99)
Potassium: 3.8 mmol/L (ref 3.5–5.1)
Sodium: 138 mmol/L (ref 135–145)

## 2022-12-26 LAB — ACTH STIMULATION, 3 TIME POINTS
Cortisol, 30 Min: 6.6 ug/dL
Cortisol, 60 Min: 7.7 ug/dL
Cortisol, Base: 1.1 ug/dL

## 2022-12-26 LAB — CBC
HCT: 29.9 % — ABNORMAL LOW (ref 39.0–52.0)
Hemoglobin: 9.7 g/dL — ABNORMAL LOW (ref 13.0–17.0)
MCH: 30.1 pg (ref 26.0–34.0)
MCHC: 32.4 g/dL (ref 30.0–36.0)
MCV: 92.9 fL (ref 80.0–100.0)
Platelets: 71 10*3/uL — ABNORMAL LOW (ref 150–400)
RBC: 3.22 MIL/uL — ABNORMAL LOW (ref 4.22–5.81)
RDW: 14.6 % (ref 11.5–15.5)
WBC: 2 10*3/uL — ABNORMAL LOW (ref 4.0–10.5)
nRBC: 0 % (ref 0.0–0.2)

## 2022-12-26 LAB — AMMONIA: Ammonia: 141 umol/L — ABNORMAL HIGH (ref 9–35)

## 2022-12-26 MED ORDER — ALUM & MAG HYDROXIDE-SIMETH 200-200-20 MG/5ML PO SUSP
30.0000 mL | ORAL | Status: DC | PRN
  Administered 2022-12-26: 30 mL via ORAL
  Filled 2022-12-26: qty 30

## 2022-12-26 MED ORDER — HYDROCORTISONE 5 MG PO TABS
5.0000 mg | ORAL_TABLET | Freq: Two times a day (BID) | ORAL | Status: DC
  Filled 2022-12-26 (×2): qty 1

## 2022-12-26 MED ORDER — HYDROCORTISONE 10 MG PO TABS: 10.0000 mg | ORAL_TABLET | Freq: Every day | ORAL | Status: DC

## 2022-12-26 MED ORDER — HYDROCORTISONE 10 MG PO TABS
10.0000 mg | ORAL_TABLET | Freq: Once | ORAL | Status: AC
  Administered 2022-12-26: 10 mg via ORAL
  Filled 2022-12-26: qty 1

## 2022-12-26 MED ORDER — HYDROCORTISONE 10 MG PO TABS
10.0000 mg | ORAL_TABLET | Freq: Every day | ORAL | Status: DC
  Administered 2022-12-27: 10 mg via ORAL
  Filled 2022-12-26: qty 1

## 2022-12-26 MED ORDER — HYDROCORTISONE 5 MG PO TABS
5.0000 mg | ORAL_TABLET | Freq: Two times a day (BID) | ORAL | Status: AC
  Administered 2022-12-26 (×2): 5 mg via ORAL
  Filled 2022-12-26 (×2): qty 1

## 2022-12-26 MED ORDER — ACETAMINOPHEN 325 MG PO TABS
650.0000 mg | ORAL_TABLET | Freq: Four times a day (QID) | ORAL | Status: DC | PRN
  Administered 2022-12-26: 650 mg via ORAL
  Filled 2022-12-26: qty 2

## 2022-12-26 NOTE — Progress Notes (Signed)
[facility]  - [place] at [facility]   PATIENT NAME: [name]    MR#:  [id number]  DATE OF BIRTH:  [birth year]  SUBJECTIVE:  no family at bedside. Patient overall feeling okay. Had some diarrheal stools with lactulose. Blood pressure stable    VITALS:  Blood pressure (!) 137/59, pulse (!) 59, temperature 98 F (36.7 C), resp. rate 20, height 5\' 6"  (1.676 m), weight 95 kg, SpO2 96 %.  PHYSICAL EXAMINATION:   GENERAL:  72 y.o.-year-old patient with no acute distress.  LUNGS: Normal breath sounds bilaterally, no wheezing CARDIOVASCULAR: S1, S2 normal. No murmur   ABDOMEN: Soft, nontender, nondistended. Bowel sounds present.  EXTREMITIES: No  edema b/l.    NEUROLOGIC: nonfocal  patient is alert and awake SKIN: No obvious rash, lesion, or ulcer.   LABORATORY PANEL:  CBC Recent Labs  Lab 12/26/22 0503  WBC 2.0*  HGB 9.7*  HCT 29.9*  PLT 71*    Chemistries  Recent Labs  Lab 12/24/22 0406 12/25/22 0748 12/26/22 0503  NA 138 138 138  K 3.7 4.0 3.8  CL 105 107 110  CO2 28 27 26   GLUCOSE 119* 113* 92  BUN 29* 29* 25*  CREATININE 1.17 1.13 1.04  CALCIUM 8.7* 8.2* 8.0*  MG 1.6* 1.6*  --   AST 32  --   --   ALT 16  --   --   ALKPHOS 76  --   --   BILITOT 1.4*  --   --    Assessment and Plan  72 y.o. male with medical history significant for essential hypertension, type 2 diabetes, chronic diastolic CHF, homelessness, who presented to [facility] ED with complaints of chest pain and syncope.  Endorses intermittent stabbing centrally located chest pain for the past 4 days, worse with exertion.  The patient was walking at [facility] when he felt chest pain and then passed out.   Orthostatic hypotension Adrenal insufficiency (abnormal ACTH simulation test) -- patient EM cortisol was 4.7. ACTH stimulation test showed peak cortisol reach at 7.7 which is remarkably low. Discussed results with patient and will start on hydrocortisone tablets. Patient is in  agreement --cont midodrine    Acute hepatic encephalopathy (HCC) Ammonia level 289.  Patient hesitant on dosing of lactulose.  Will dose once a day for now. 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  As needed oxycodone.   Junctional rhythm Junctional rhythm ruled out.  Patient sinus bradycardia on monitor.  Cardiology reviewed EKG and believes that sinus rhythm also.   Hyperlipidemia, unspecified Continue Lipitor   Thrombocytopenia (HCC) Chronic in nature likely with liver cirrhosis and hepatitis B.   Obesity (BMI 30-39.9) BMI 33.80 with current height and weight in computer.   Myocardial ischemia Troponin only slightly elevated.   Hepatitis B infection Refer to gastroenterology as outpatient.   Normocytic anemia Add on ferritin for further evaluation of anemia.   Patient ambulated by himself in the room. Will continue to monitor blood pressure for one more day send started on hydrocortisone. If remains stable will discharge tomorrow. Patient lives in tent behind [place] in [place]. He has been able to manage so far since last six months. He is going to return back to his tent at discharge.  Procedures: Family communication :none Consults :none CODE STATUS: full DVT Prophylaxis :  enoxaparin Level of care: Telemetry Cardiac Status is: Inpatient Remains inpatient appropriate because: diagnosed with adrenal insufficiency. Will monitor tolerance of hydrocortisone.    TOTAL TIME TAKING CARE OF THIS PATIENT: 35 minutes.  >50% time spent on counselling and coordination of care  Note: This dictation was prepared with Dragon dictation along with smaller phrase technology. Any transcriptional errors that result from this process are unintentional.  Enedina Finner M.D    [facility]    CC: Primary care physician; [name], MD

## 2022-12-26 NOTE — Progress Notes (Signed)
PT Cancellation Note  Patient Details Name: [name] MRN: [id number] DOB: 13-May-1951   Cancelled Treatment:    Reason Eval/Treat Not Completed: Other (comment) (Author stopped by to do PT session. Pt reports tummy troubles at present, does not think he can tolerate OOB. He reports 3 trips to [place] this morning and denies any abnormal symptoms. WIll check back later.)  12:53 PM, 12/26/22 Rosamaria Lints, PT, DPT Physical Therapist - [facility]  [phone] (ASCOM)    Lyndsey Demos C 12/26/2022, 12:53 PM

## 2022-12-27 DIAGNOSIS — I951 Orthostatic hypotension: Secondary | ICD-10-CM | POA: Diagnosis not present

## 2022-12-27 MED ORDER — METFORMIN HCL 500 MG PO TABS
500.0000 mg | ORAL_TABLET | Freq: Every day | ORAL | Status: DC
  Administered 2022-12-27: 500 mg via ORAL
  Filled 2022-12-27: qty 1

## 2022-12-27 MED ORDER — LOSARTAN POTASSIUM 25 MG PO TABS
25.0000 mg | ORAL_TABLET | Freq: Every day | ORAL | Status: DC
  Filled 2022-12-27: qty 1

## 2022-12-27 MED ORDER — RIFAXIMIN 550 MG PO TABS: 550.0000 mg | ORAL_TABLET | Freq: Two times a day (BID) | ORAL | 1 refills | Status: DC

## 2022-12-27 MED ORDER — EMPAGLIFLOZIN 10 MG PO TABS
10.0000 mg | ORAL_TABLET | Freq: Every day | ORAL | Status: DC
  Administered 2022-12-27: 10 mg via ORAL
  Filled 2022-12-27: qty 1

## 2022-12-27 MED ORDER — HYDROCORTISONE 5 MG PO TABS: 5.0000 mg | ORAL_TABLET | Freq: Two times a day (BID) | ORAL | 1 refills | Status: DC

## 2022-12-27 MED ORDER — LOSARTAN POTASSIUM 50 MG PO TABS: 50.0000 mg | ORAL_TABLET | Freq: Every day | ORAL | Status: DC

## 2022-12-27 MED ORDER — HYDROCORTISONE 10 MG PO TABS: 10.0000 mg | ORAL_TABLET | Freq: Every day | ORAL | 1 refills | Status: DC

## 2022-12-27 MED ORDER — LOSARTAN POTASSIUM 50 MG PO TABS
50.0000 mg | ORAL_TABLET | Freq: Every day | ORAL | Status: DC
  Administered 2022-12-27: 50 mg via ORAL

## 2022-12-27 NOTE — Progress Notes (Signed)
Discussed discharge information including medications and follow up appointments with patient.  AVS sent home.     Patient received a taxi voucher

## 2022-12-27 NOTE — Discharge Summary (Signed)
Physician Discharge Summary   Patient: [name] MRN: [id number] DOB: 13-Jan-1951  Admit date:     12/23/2022  Discharge date: 12/27/22  Discharge Physician: Enedina Finner   PCP: Drew Aldo, MD   Recommendations at discharge:    F/u Dr [name] in 1-2 weeks. Pt will need CBC,BMP checked. Pt will also need f/u for adrenal insufficiency  Discharge Diagnoses: Principal Problem:   Orthostatic hypotension Active Problems:   Acute hepatic encephalopathy (HCC)   Liver cirrhosis (HCC)   Rib pain on right side   Junctional rhythm   Hyperlipidemia, unspecified   Thrombocytopenia (HCC)   Obesity (BMI 30-39.9)   Normocytic anemia   Syncope   Hepatitis B infection   Myocardial ischemia   Adrenal insufficiency (HCC)   72 y.o. male with medical history significant for essential hypertension, type 2 diabetes, chronic diastolic CHF, homelessness, who presented to [facility] ED with complaints of chest pain and syncope.  Endorses intermittent stabbing centrally located chest pain for the past 4 days, worse with exertion.  The patient was walking at [facility] when he felt chest pain and then passed out.    Orthostatic hypotension Adrenal insufficiency--new (abnormal ACTH simulation test) -- patient EM cortisol was 4.7. ACTH stimulation test showed peak cortisol reach at 7.7 which is remarkably low. Discussed results with patient and will start on hydrocortisone tablets. Patient is in agreement ---BP up now. D/c midodrine --resumed losartan   Acute hepatic encephalopathy (HCC) Ammonia level 289.  Patient hesitant on dosing of lactulose.  Will dose once a day for now. Cont Rifaximin-- per pharmacy approved and ) $ co-pay   Liver cirrhosis (HCC) due to Hepatitis B (per pt) Seen on prior imaging, pancytopenia, acute hepatic encephalopathy, slight coagulopathy   Rib pain on right side Rib x-ray does not show any fracture.  As needed oxycodone.   Junctional rhythm Junctional rhythm ruled out.   Patient sinus bradycardia on monitor.  Cardiology reviewed EKG and believes that sinus rhythm also.   Hyperlipidemia, unspecified Continue Lipitor   Thrombocytopenia (HCC) Chronic in nature likely with liver cirrhosis and hepatitis B.   Obesity (BMI 30-39.9) BMI 33.80 with current height and weight in computer.   Myocardial ischemia Troponin only slightly elevated.   Hepatitis B infection Refer to gastroenterology as outpatient.   Normocytic anemia Hgb stable   Patient ambulated by himself in the room. Overall BP better Patient lives in tent behind [place] [address] in [place]. He has been able to manage so far since last six months. He is going to return back to his tent at discharge. D/c today. Pt agreeable    Pain control - [place] Controlled Substance Reporting System database was reviewed. and patient was instructed, not to drive, operate heavy machinery, perform activities at heights, swimming or participation in water activities or provide baby-sitting services while on Pain, Sleep and Anxiety Medications; until their outpatient Physician has advised to do so again. Also recommended to not to take more than prescribed Pain, Sleep and Anxiety Medications.  Consultants: none Disposition: Home Diet recommendation:  Discharge Diet Orders (From admission, onward)     Start     Ordered   12/27/22 0000  Diet - low sodium heart healthy        12/27/22 0844           Cardiac diet DISCHARGE MEDICATION: Allergies as of 12/27/2022       Reactions   Bee Venom Anaphylaxis   Codeine Itching   Only itching. Recently  allergy tested at [facility]   Novocain [procaine]    Sulfa Antibiotics Other (See Comments)        Medication List     TAKE these medications    albuterol 108 (90 Base) MCG/ACT inhaler Commonly known as: VENTOLIN HFA Inhale 2 puffs into the lungs every 6 (six) hours as needed for wheezing or shortness of breath.   ascorbic acid 500 MG tablet Commonly known  as: VITAMIN C Take 1 tablet (500 mg total) by mouth daily.   aspirin EC 81 MG tablet Take 1 tablet (81 mg total) by mouth daily. Swallow whole.   atorvastatin 40 MG tablet Commonly known as: LIPITOR Take 1 tablet (40 mg total) by mouth daily.   citalopram 20 MG tablet Commonly known as: CELEXA Take 1 tablet (20 mg total) by mouth daily.   doxepin 50 MG capsule Commonly known as: SINEQUAN Take 1 capsule (50 mg total) by mouth at bedtime.   glucosamine-chondroitin 500-400 MG tablet Take 1 tablet by mouth 3 (three) times daily.   hydrocortisone 5 MG tablet Commonly known as: CORTEF Take 1 tablet (5 mg total) by mouth 2 (two) times daily.   hydrocortisone 10 MG tablet Commonly known as: CORTEF Take 1 tablet (10 mg total) by mouth daily.   Jardiance 10 MG Tabs tablet Generic drug: empagliflozin Take 1 tablet (10 mg total) by mouth daily.   lactulose 10 GM/15ML solution Commonly known as: CHRONULAC Take 30 mLs (20 g total) by mouth 2 (two) times daily.   losartan 50 MG tablet Commonly known as: COZAAR Take 1 tablet (50 mg total) by mouth daily after breakfast.   meclizine 25 MG tablet Commonly known as: ANTIVERT Take 0.5 tablets (12.5 mg total) by mouth 2 (two) times daily as needed for up to 12 doses for dizziness.   metFORMIN 500 MG tablet Commonly known as: GLUCOPHAGE Take 500 mg by mouth daily.   multivitamin with minerals Tabs tablet Take 1 tablet by mouth daily.   nitroGLYCERIN 0.4 MG SL tablet Commonly known as: NITROSTAT Place 1 tablet (0.4 mg total) under the tongue every 5 (five) minutes x 3 doses as needed for chest pain.   oxyCODONE 5 MG immediate release tablet Commonly known as: Roxicodone Take 1 tablet (5 mg total) by mouth every 8 (eight) hours as needed for up to 6 doses.   pantoprazole 20 MG tablet Commonly known as: PROTONIX Take 1 tablet (20 mg total) by mouth daily.   QUEtiapine 50 MG tablet Commonly known as: SEROQUEL Take 1 tablet (50  mg total) by mouth at bedtime.   rifaximin 550 MG Tabs tablet Commonly known as: XIFAXAN Take 1 tablet (550 mg total) by mouth 2 (two) times daily.        Follow-up Information     Drew Aldo, MD. Schedule an appointment as soon as possible for a visit in 1 week(s).   Specialty: Family Medicine Why: hospital f/u Contact information: [address] [place] [address] [phone]                Discharge Exam: Ceasar Mons Weights   12/23/22 1812  Weight: 95 kg   GENERAL:  72 y.o.-year-old patient with no acute distress.  LUNGS: Normal breath sounds bilaterally, no wheezing CARDIOVASCULAR: S1, S2 normal. No murmur   ABDOMEN: Soft, nontender, nondistended. Bowel sounds present.  EXTREMITIES: No  edema b/l.    NEUROLOGIC: nonfocal  patient is alert and awake SKIN: No obvious rash, lesion, or ulcer.  Condition at discharge: fair  The results of significant diagnostics from this hospitalization (including imaging, microbiology, ancillary and laboratory) are listed below for reference.   Imaging Studies: ECHOCARDIOGRAM COMPLETE  Result Date: 12/24/2022    ECHOCARDIOGRAM REPORT   Patient Name:   JAVARIOUS ELSAYED Date of Exam: 12/24/2022 Medical Rec #:  [id number]      Height:       66.0 in Accession #:    [id number]     Weight:       209.4 lb Date of Birth:  1950-09-09     BSA:          2.039 m Patient Age:    71 years       BP:           109/55 mmHg Patient Gender: M              HR:           53 bpm. Exam Location:  [facility] Procedure: 2D Echo, Cardiac Doppler and Color Doppler Indications:     Syncope R55  History:         Patient has prior history of Echocardiogram examinations, most                  recent 03/25/2022. CHF, COPD; Risk Factors:Hypertension.  Sonographer:     Cristela Blue Referring Phys:  [id number] Oliver Pila HALL Diagnosing Phys: Lorine Bears MD IMPRESSIONS  1. Left ventricular ejection fraction, by estimation, is 60 to 65%. The left ventricle has normal  function. The left ventricle has no regional wall motion abnormalities. There is mild left ventricular hypertrophy. Left ventricular diastolic parameters were normal.  2. Right ventricular systolic function is normal. The right ventricular size is normal. Tricuspid regurgitation signal is inadequate for assessing PA pressure.  3. Left atrial size was mildly dilated.  4. The mitral valve is normal in structure. No evidence of mitral valve regurgitation. No evidence of mitral stenosis.  5. The aortic valve is normal in structure. Aortic valve regurgitation is not visualized. No aortic stenosis is present. FINDINGS  Left Ventricle: Left ventricular ejection fraction, by estimation, is 60 to 65%. The left ventricle has normal function. The left ventricle has no regional wall motion abnormalities. The left ventricular internal cavity size was normal in size. There is  mild left ventricular hypertrophy. Left ventricular diastolic parameters were normal. Right Ventricle: The right ventricular size is normal. No increase in right ventricular wall thickness. Right ventricular systolic function is normal. Tricuspid regurgitation signal is inadequate for assessing PA pressure. Left Atrium: Left atrial size was mildly dilated. Right Atrium: Right atrial size was normal in size. Pericardium: There is no evidence of pericardial effusion. Mitral Valve: The mitral valve is normal in structure. No evidence of mitral valve regurgitation. No evidence of mitral valve stenosis. MV peak gradient, 6.2 mmHg. The mean mitral valve gradient is 2.0 mmHg. Tricuspid Valve: The tricuspid valve is normal in structure. Tricuspid valve regurgitation is not demonstrated. No evidence of tricuspid stenosis. Aortic Valve: The aortic valve is normal in structure. Aortic valve regurgitation is not visualized. No aortic stenosis is present. Aortic valve mean gradient measures 6.0 mmHg. Aortic valve peak gradient measures 11.7 mmHg. Aortic valve area, by  VTI measures 2.77 cm. Pulmonic Valve: The pulmonic valve was normal in structure. Pulmonic valve regurgitation is not visualized. No evidence of pulmonic stenosis. Aorta: The aortic root is normal in size and structure. Venous: The inferior vena cava was not well  visualized. IAS/Shunts: No atrial level shunt detected by color flow Doppler.  LEFT VENTRICLE PLAX 2D LVIDd:         4.80 cm   Diastology LVIDs:         3.30 cm   LV e' medial:    9.03 cm/s LV PW:         1.20 cm   LV E/e' medial:  10.7 LV IVS:        1.30 cm   LV e' lateral:   9.36 cm/s LVOT diam:     2.00 cm   LV E/e' lateral: 10.3 LV SV:         97 LV SV Index:   48 LVOT Area:     3.14 cm  RIGHT VENTRICLE RV Basal diam:  4.30 cm RV Mid diam:    3.70 cm RV S prime:     17.60 cm/s TAPSE (M-mode): 2.6 cm LEFT ATRIUM             Index        RIGHT ATRIUM           Index LA diam:        4.80 cm 2.35 cm/m   RA Area:     19.60 cm LA Vol (A2C):   78.9 ml 38.69 ml/m  RA Volume:   55.90 ml  27.41 ml/m LA Vol (A4C):   69.0 ml 33.83 ml/m LA Biplane Vol: 74.1 ml 36.33 ml/m  AORTIC VALVE AV Area (Vmax):    2.57 cm AV Area (Vmean):   2.68 cm AV Area (VTI):     2.77 cm AV Vmax:           171.00 cm/s AV Vmean:          114.000 cm/s AV VTI:            0.351 m AV Peak Grad:      11.7 mmHg AV Mean Grad:      6.0 mmHg LVOT Vmax:         140.00 cm/s LVOT Vmean:        97.400 cm/s LVOT VTI:          0.309 m LVOT/AV VTI ratio: 0.88  AORTA Ao Root diam: 3.50 cm MITRAL VALVE               TRICUSPID VALVE MV Area (PHT): 2.56 cm    TR Peak grad:   23.2 mmHg MV Area VTI:   2.04 cm    TR Vmax:        241.00 cm/s MV Peak grad:  6.2 mmHg MV Mean grad:  2.0 mmHg    SHUNTS MV Vmax:       1.25 m/s    Systemic VTI:  0.31 m MV Vmean:      63.8 cm/s   Systemic Diam: 2.00 cm MV Decel Time: 296 msec MV E velocity: 96.40 cm/s MV A velocity: 81.60 cm/s MV E/A ratio:  1.18 Lorine Bears MD Electronically signed by Lorine Bears MD Signature Date/Time: 12/24/2022/6:07:28 PM    Final     DG Ribs Unilateral Right  Result Date: 12/24/2022 CLINICAL DATA:  Fall with right-sided rib pain EXAM: RIGHT RIBS - 2 VIEW COMPARISON:  None Available. FINDINGS: No evidence of displaced rib fracture. Cholecystectomy clips. Visualized soft tissues are unremarkable. IMPRESSION: No evidence of displaced rib fracture. Electronically Signed   By: Allegra Lai M.D.   On: 12/24/2022 13:13   CT Angio Chest/Abd/Pel for Dissection  W and/or Wo Contrast  Result Date: 12/24/2022 CLINICAL DATA:  Chest pain radiating to the left arm EXAM: CT ANGIOGRAPHY CHEST, ABDOMEN AND PELVIS TECHNIQUE: Non-contrast CT of the chest was initially obtained. Multidetector CT imaging through the chest, abdomen and pelvis was performed using the standard protocol during bolus administration of intravenous contrast. Multiplanar reconstructed images and MIPs were obtained and reviewed to evaluate the vascular anatomy. RADIATION DOSE REDUCTION: This exam was performed according to the departmental dose-optimization program which includes automated exposure control, adjustment of the mA and/or kV according to patient size and/or use of iterative reconstruction technique. CONTRAST:  OMNIPAQUE IOHEXOL 350 MG/ML SOLN COMPARISON:  Chest x-ray 12/23/2022, CT abdomen pelvis 11/18/2022, CT chest 08/30/2022 FINDINGS: CTA CHEST FINDINGS Cardiovascular: Non contrasted images of the chest demonstrate no acute intramural hematoma. Nonaneurysmal aorta. No dissection is seen. Dilated pulmonary trunk measuring up to 4.5 cm. Central pulmonary arteries are also dilated. Coronary vascular calcification. Normal cardiac size. Trace pericardial effusion Mediastinum/Nodes: Midline trachea. No thyroid mass. No suspicious lymph nodes. Esophagus within normal limits. Five Lungs/Pleura: Scarring in the left upper lobe. No acute airspace disease, pleural effusion, or pneumothorax Musculoskeletal: Ununited upper sternal fracture. Thoracic alignment within  normal limits. Review of the MIP images confirms the above findings. CTA ABDOMEN AND PELVIS FINDINGS VASCULAR Aorta: Normal caliber aorta without aneurysm, dissection, vasculitis or significant stenosis. Celiac: Patent without evidence of aneurysm, dissection, vasculitis or significant stenosis. SMA: Patent without evidence of aneurysm, dissection, vasculitis or significant stenosis. Replaced right hepatic artery Renals: Both renal arteries are patent without evidence of aneurysm, dissection, vasculitis, fibromuscular dysplasia or significant stenosis. IMA: Patent without evidence of aneurysm, dissection, vasculitis or significant stenosis. Inflow: Patent without evidence of aneurysm, dissection, vasculitis or significant stenosis. Veins: Recanalized umbilical vein with large anterior intra-abdominal collateral vessels to the right common femoral vein as before. Review of the MIP images confirms the above findings. NON-VASCULAR Hepatobiliary: Hepatic cirrhosis. Posteromedial right hepatic lobe lesion is more difficult to appreciate today, it measures approximately 3.2 cm compared with 2.7 cm previously. Status post cholecystectomy. Markedly enlarged extrahepatic common bile duct measuring up to 3 cm. Pancreas: No inflammatory changes. Spleen: Borderline to slightly enlarged at 13.4 cm Adrenals/Urinary Tract: Adrenal glands are normal. Kidneys show no hydronephrosis. Areas of cortical scarring. Small nonobstructing lower pole stones on the right. The bladder is unremarkable. Stomach/Bowel: The stomach is nonenlarged. Slightly thick-walled appearance of the stomach but under distended. No dilated small bowel. No acute bowel wall thickening. Lymphatic: No suspicious lymph nodes Reproductive: Prostate is unremarkable. Other: Negative for pelvic effusion or free air. Subcutaneous edema consistent with anasarca Musculoskeletal: No acute or suspicious osseous abnormality. Review of the MIP images confirms the above  findings. IMPRESSION: 1. Negative for acute aortic dissection or aneurysm. 2. Dilated pulmonary trunk and central pulmonary arteries consistent with pulmonary arterial hypertension. 3. Hepatic cirrhosis with evidence for portal hypertension as before. Posteromedial right hepatic lobe lesion is more difficult to appreciate today, it measures approximately 3.2 cm based on contour deformity compared with 2.7 cm previously. Reference MRI 08/31/2022 for additional recommendation. 4. Status post cholecystectomy. Markedly enlarged extrahepatic common bile duct measuring up to 3 cm, unchanged compared with most recent previous exams. 5. Nonobstructing right kidney stones. Electronically Signed   By: Jasmine Pang M.D.   On: 12/24/2022 01:28   CT HEAD WO CONTRAST ( )  Result Date: 12/24/2022 CLINICAL DATA:  Headache, new onset (Age >= 51y); Neck trauma (Age >= 65y) Pt here with cp that  started yesterday. Pt states pain is centered and radiates to his left arm. Pt states pain is constant and is sharp and stabbing. EXAM: CT HEAD WITHOUT CONTRAST CT CERVICAL SPINE WITHOUT CONTRAST TECHNIQUE: Multidetector CT imaging of the head and cervical spine was performed following the standard protocol without intravenous contrast. Multiplanar CT image reconstructions of the cervical spine were also generated. RADIATION DOSE REDUCTION: This exam was performed according to the departmental dose-optimization program which includes automated exposure control, adjustment of the mA and/or kV according to patient size and/or use of iterative reconstruction technique. COMPARISON:  CT head and C-spine 08/12/2022 FINDINGS: CT HEAD FINDINGS Brain: No evidence of large-territorial acute infarction. No parenchymal hemorrhage. No mass lesion. No extra-axial collection. No mass effect or midline shift. No hydrocephalus. Basilar cisterns are patent. Vascular: No hyperdense vessel. Skull: No acute fracture or focal lesion. Sinuses/Orbits: Paranasal  sinuses and mastoid air cells are clear. The orbits are unremarkable. Other: Patient is edentulous. CT CERVICAL SPINE FINDINGS Alignment: Normal. Skull base and vertebrae: Multilevel moderate severe degenerative changes spine with associated multilevel moderate severe osseous neural foraminal stenosis. Posterior disc osteophyte complex formations at the C4-C5, C5-C6, C6-C7 levels. No severe osseous central canal stenosis. No acute fracture. No aggressive appearing focal osseous lesion or focal pathologic process. Soft tissues and spinal canal: No prevertebral fluid or swelling. No visible canal hematoma. Upper chest: Unremarkable. Other: Atherosclerotic plaque of the aortic arch. Atherosclerotic plaque of the carotid arteries within the neck. IMPRESSION: 1. No acute intracranial abnormality. 2. No acute displaced fracture or traumatic listhesis of the cervical spine. 3. Multilevel moderate severe degenerative changes spine with associated multilevel moderate severe osseous neural foraminal stenosis-most severe at the left C5-C6 level. Electronically Signed   By: Tish Frederickson M.D.   On: 12/24/2022 01:20   CT Cervical Spine Wo Contrast  Result Date: 12/24/2022 CLINICAL DATA:  Headache, new onset (Age >= 51y); Neck trauma (Age >= 65y) Pt here with cp that started yesterday. Pt states pain is centered and radiates to his left arm. Pt states pain is constant and is sharp and stabbing. EXAM: CT HEAD WITHOUT CONTRAST CT CERVICAL SPINE WITHOUT CONTRAST TECHNIQUE: Multidetector CT imaging of the head and cervical spine was performed following the standard protocol without intravenous contrast. Multiplanar CT image reconstructions of the cervical spine were also generated. RADIATION DOSE REDUCTION: This exam was performed according to the departmental dose-optimization program which includes automated exposure control, adjustment of the mA and/or kV according to patient size and/or use of iterative reconstruction  technique. COMPARISON:  CT head and C-spine 08/12/2022 FINDINGS: CT HEAD FINDINGS Brain: No evidence of large-territorial acute infarction. No parenchymal hemorrhage. No mass lesion. No extra-axial collection. No mass effect or midline shift. No hydrocephalus. Basilar cisterns are patent. Vascular: No hyperdense vessel. Skull: No acute fracture or focal lesion. Sinuses/Orbits: Paranasal sinuses and mastoid air cells are clear. The orbits are unremarkable. Other: Patient is edentulous. CT CERVICAL SPINE FINDINGS Alignment: Normal. Skull base and vertebrae: Multilevel moderate severe degenerative changes spine with associated multilevel moderate severe osseous neural foraminal stenosis. Posterior disc osteophyte complex formations at the C4-C5, C5-C6, C6-C7 levels. No severe osseous central canal stenosis. No acute fracture. No aggressive appearing focal osseous lesion or focal pathologic process. Soft tissues and spinal canal: No prevertebral fluid or swelling. No visible canal hematoma. Upper chest: Unremarkable. Other: Atherosclerotic plaque of the aortic arch. Atherosclerotic plaque of the carotid arteries within the neck. IMPRESSION: 1. No acute intracranial abnormality. 2. No acute displaced  fracture or traumatic listhesis of the cervical spine. 3. Multilevel moderate severe degenerative changes spine with associated multilevel moderate severe osseous neural foraminal stenosis-most severe at the left C5-C6 level. Electronically Signed   By: Tish Frederickson M.D.   On: 12/24/2022 01:20   DG Chest 2 View  Result Date: 12/23/2022 CLINICAL DATA:  Chest pain EXAM: CHEST - 2 VIEW COMPARISON:  12/11/2022 FINDINGS: The heart size and mediastinal contours are within normal limits. Both lungs are clear. The visualized skeletal structures show degenerative change of the thoracic spine. Healing rib fractures are noted on the left anteriorly. IMPRESSION: No acute abnormality noted. Electronically Signed   By: Alcide Clever  M.D.   On: 12/23/2022 19:26    Microbiology: Results for orders placed or performed during the hospital encounter of 11/10/22  Blood culture (routine x 2)     Status: None   Collection Time: 11/10/22  2:38 PM   Specimen: BLOOD  Result Value Ref Range Status   Specimen Description BLOOD BLOOD LEFT FOREARM  Final   Special Requests   Final    BOTTLES DRAWN AEROBIC AND ANAEROBIC Blood Culture results may not be optimal due to an excessive volume of blood received in culture bottles   Culture   Final    NO GROWTH 6 DAYS Performed at [facility], [address] [place], [place] [address]    Report Status 11/16/2022 FINAL  Final  Blood culture (routine x 2)     Status: Abnormal   Collection Time: 11/10/22  3:46 PM   Specimen: BLOOD  Result Value Ref Range Status   Specimen Description   Final    BLOOD BLOOD LEFT FOREARM Performed at [facility], [address], [place], [place] [address]    Special Requests   Final    BOTTLES DRAWN AEROBIC AND ANAEROBIC Blood Culture adequate volume Performed at [facility], [address] [place], [place] [address]    Culture  Setup Time   Final    Organism ID to follow GRAM POSITIVE COCCI ANAEROBIC BOTTLE ONLY CRITICAL RESULT CALLED TO, READ BACK BY AND VERIFIED WITH: ASHLEY MURRAY ON 11/11/22 AT 9147 QSD Performed at [facility], [address] [place], [place] [address]    Culture (A)  Final    STAPHYLOCOCCUS EPIDERMIDIS THE SIGNIFICANCE OF ISOLATING THIS ORGANISM FROM A SINGLE SET OF BLOOD CULTURES WHEN MULTIPLE SETS ARE DRAWN IS UNCERTAIN. PLEASE NOTIFY THE MICROBIOLOGY DEPARTMENT WITHIN ONE WEEK IF SPECIATION AND SENSITIVITIES ARE REQUIRED. Performed at [facility], [address] [place], [place] [address]    Report Status 11/13/2022 FINAL  Final  Blood Culture ID Panel (Reflexed)     Status: Abnormal   Collection Time: 11/10/22  3:46 PM  Result Value Ref Range Status   Enterococcus  faecalis NOT DETECTED NOT DETECTED Final   Enterococcus Faecium NOT DETECTED NOT DETECTED Final   Listeria monocytogenes NOT DETECTED NOT DETECTED Final   Staphylococcus species DETECTED (A) NOT DETECTED Final    Comment: CRITICAL RESULT CALLED TO, READ BACK BY AND VERIFIED WITH: ASHLEY MURRAY ON 11/11/22 AT 0949 QSD    Staphylococcus aureus (BCID) NOT DETECTED NOT DETECTED Final   Staphylococcus epidermidis DETECTED (A) NOT DETECTED Final    Comment: Methicillin (oxacillin) resistant coagulase negative staphylococcus. Possible blood culture contaminant (unless isolated from more than one blood culture draw or clinical case suggests pathogenicity). No antibiotic treatment is indicated for blood  culture contaminants. CRITICAL RESULT CALLED TO, READ  BACK BY AND VERIFIED WITH: ASHLEY MURRAY ON 11/11/22 AT 0949 QSD    Staphylococcus lugdunensis NOT DETECTED NOT DETECTED Final   Streptococcus species NOT DETECTED NOT DETECTED Final   Streptococcus agalactiae NOT DETECTED NOT DETECTED Final   Streptococcus pneumoniae NOT DETECTED NOT DETECTED Final   Streptococcus pyogenes NOT DETECTED NOT DETECTED Final   A.calcoaceticus-baumannii NOT DETECTED NOT DETECTED Final   Bacteroides fragilis NOT DETECTED NOT DETECTED Final   Enterobacterales NOT DETECTED NOT DETECTED Final   Enterobacter cloacae complex NOT DETECTED NOT DETECTED Final   Escherichia coli NOT DETECTED NOT DETECTED Final   Klebsiella aerogenes NOT DETECTED NOT DETECTED Final   Klebsiella oxytoca NOT DETECTED NOT DETECTED Final   Klebsiella pneumoniae NOT DETECTED NOT DETECTED Final   Proteus species NOT DETECTED NOT DETECTED Final   Salmonella species NOT DETECTED NOT DETECTED Final   Serratia marcescens NOT DETECTED NOT DETECTED Final   Haemophilus influenzae NOT DETECTED NOT DETECTED Final   Neisseria meningitidis NOT DETECTED NOT DETECTED Final   Pseudomonas aeruginosa NOT DETECTED NOT DETECTED Final   Stenotrophomonas  maltophilia NOT DETECTED NOT DETECTED Final   Candida albicans NOT DETECTED NOT DETECTED Final   Candida auris NOT DETECTED NOT DETECTED Final   Candida glabrata NOT DETECTED NOT DETECTED Final   Candida krusei NOT DETECTED NOT DETECTED Final   Candida parapsilosis NOT DETECTED NOT DETECTED Final   Candida tropicalis NOT DETECTED NOT DETECTED Final   Cryptococcus neoformans/gattii NOT DETECTED NOT DETECTED Final   Methicillin resistance mecA/C DETECTED (A) NOT DETECTED Final    Comment: CRITICAL RESULT CALLED TO, READ BACK BY AND VERIFIED WITH: ASHLEY MURRAY ON 11/11/22 AT 8295 QSD Performed at [facility], [address] [place], [place] [address]   Resp panel by RT-PCR (RSV, Flu A&B, Covid) Anterior Nasal Swab     Status: None   Collection Time: 11/10/22  5:25 PM   Specimen: Anterior Nasal Swab  Result Value Ref Range Status   SARS Coronavirus 2 by RT PCR NEGATIVE NEGATIVE Final    Comment: (NOTE) SARS-CoV-2 target nucleic acids are NOT DETECTED.  The SARS-CoV-2 RNA is generally detectable in upper respiratory specimens during the acute phase of infection. The lowest concentration of SARS-CoV-2 viral copies this assay can detect is 138 copies/mL. A negative result does not preclude SARS-Cov-2 infection and should not be used as the sole basis for treatment or other patient management decisions. A negative result may occur with  improper specimen collection/handling, submission of specimen other than nasopharyngeal swab, presence of viral mutation(s) within the areas targeted by this assay, and inadequate number of viral copies(<138 copies/mL). A negative result must be combined with clinical observations, patient history, and epidemiological information. The expected result is Negative.  Fact Sheet for Patients:  BloggerCourse.com  Fact Sheet for Healthcare Providers:  SeriousBroker.it  This test is no t yet  approved or cleared by the [place] FDA and  has been authorized for detection and/or diagnosis of SARS-CoV-2 by FDA under an Emergency Use Authorization (EUA). This EUA will remain  in effect (meaning this test can be used) for the duration of the COVID-19 declaration under Section 564(b)(1) of the Act, 21 U.S.C.section 360bbb-3(b)(1), unless the authorization is terminated  or revoked sooner.       Influenza A by PCR NEGATIVE NEGATIVE Final   Influenza B by PCR NEGATIVE NEGATIVE Final    Comment: (NOTE) The Xpert Xpress SARS-CoV-2/FLU/RSV plus assay is intended as an aid in the diagnosis of  influenza from Nasopharyngeal swab specimens and should not be used as a sole basis for treatment. Nasal washings and aspirates are unacceptable for Xpert Xpress SARS-CoV-2/FLU/RSV testing.  Fact Sheet for Patients: BloggerCourse.com  Fact Sheet for Healthcare Providers: SeriousBroker.it  This test is not yet approved or cleared by the [place] FDA and has been authorized for detection and/or diagnosis of SARS-CoV-2 by FDA under an Emergency Use Authorization (EUA). This EUA will remain in effect (meaning this test can be used) for the duration of the COVID-19 declaration under Section 564(b)(1) of the Act, 21 U.S.C. section 360bbb-3(b)(1), unless the authorization is terminated or revoked.     Resp Syncytial Virus by PCR NEGATIVE NEGATIVE Final    Comment: (NOTE) Fact Sheet for Patients: BloggerCourse.com  Fact Sheet for Healthcare Providers: SeriousBroker.it  This test is not yet approved or cleared by the [place] FDA and has been authorized for detection and/or diagnosis of SARS-CoV-2 by FDA under an Emergency Use Authorization (EUA). This EUA will remain in effect (meaning this test can be used) for the duration of the COVID-19 declaration under Section 564(b)(1) of  the Act, 21 U.S.C. section 360bbb-3(b)(1), unless the authorization is terminated or revoked.  Performed at [facility], [address] [place], [place] [address]     Labs: CBC: Recent Labs  Lab 12/23/22 1814 12/24/22 0406 12/25/22 0748 12/26/22 0503  WBC 5.4 3.3* 1.9* 2.0*  HGB 11.6* 9.8* 9.8* 9.7*  HCT 35.3* 30.3* 28.8* 29.9*  MCV 93.4 92.9 92.3 92.9  PLT 115* 76* 76* 71*   Basic Metabolic Panel: Recent Labs  Lab 12/23/22 1814 12/24/22 0406 12/25/22 0748 12/26/22 0503  NA 139 138 138 138  K 4.1 3.7 4.0 3.8  CL 103 105 107 110  CO2 33* 28 27 26   GLUCOSE 117* 119* 113* 92  BUN 30* 29* 29* 25*  CREATININE 1.22 1.17 1.13 1.04  CALCIUM 9.2 8.7* 8.2* 8.0*  MG  --  1.6* 1.6*  --   PHOS  --  3.9  --   --    Liver Function Tests: Recent Labs  Lab 12/23/22 2314 12/24/22 0406  AST 36 32  ALT 19 16  ALKPHOS 92 76  BILITOT 1.3* 1.4*  PROT 5.8* 5.0*  ALBUMIN 2.4* 2.0*   CBG: Recent Labs  Lab 12/24/22 1635 12/24/22 2215 12/24/22 2306 12/25/22 0810 12/25/22 1342  GLUCAP 98 62* 128* 101* 119*    Discharge time spent: greater than 30 minutes.  Signed: Enedina Finner, MD [facility] 12/27/2022

## 2022-12-28 LAB — HEPATITIS B DNA, ULTRAQUANTITATIVE, PCR
HBV DNA SERPL PCR-ACNC: NOT DETECTED IU/mL
HBV DNA SERPL PCR-LOG IU: UNDETERMINED log10 IU/mL

## 2022-12-28 LAB — ACTH: C206 ACTH: 20.5 pg/mL (ref 7.2–63.3)

## 2023-01-08 ENCOUNTER — Other Ambulatory Visit: Payer: Self-pay

## 2023-01-08 ENCOUNTER — Emergency Department
Admission: EM | Admit: 2023-01-08 | Discharge: 2023-01-09 | Disposition: A | Discharge status: Home / Self Care | Payer: 59 | Attending: Emergency Medicine | Admitting: Emergency Medicine

## 2023-01-08 ENCOUNTER — Emergency Department: Payer: 59

## 2023-01-08 ENCOUNTER — Encounter: Payer: Self-pay | Admitting: *Deleted

## 2023-01-08 DIAGNOSIS — R42 Dizziness and giddiness: Secondary | ICD-10-CM | POA: Diagnosis not present

## 2023-01-08 DIAGNOSIS — E119 Type 2 diabetes mellitus without complications: Secondary | ICD-10-CM | POA: Diagnosis not present

## 2023-01-08 DIAGNOSIS — R109 Unspecified abdominal pain: Secondary | ICD-10-CM | POA: Insufficient documentation

## 2023-01-08 DIAGNOSIS — I509 Heart failure, unspecified: Secondary | ICD-10-CM | POA: Insufficient documentation

## 2023-01-08 DIAGNOSIS — R0602 Shortness of breath: Secondary | ICD-10-CM | POA: Diagnosis not present

## 2023-01-08 LAB — LIPASE, BLOOD: Lipase: 33 U/L (ref 11–51)

## 2023-01-08 LAB — COMPREHENSIVE METABOLIC PANEL
ALT: 28 U/L (ref 0–44)
AST: 48 U/L — ABNORMAL HIGH (ref 15–41)
Albumin: 2.5 g/dL — ABNORMAL LOW (ref 3.5–5.0)
Alkaline Phosphatase: 95 U/L (ref 38–126)
Anion gap: 7 (ref 5–15)
BUN: 14 mg/dL (ref 8–23)
CO2: 23 mmol/L (ref 22–32)
Calcium: 8.4 mg/dL — ABNORMAL LOW (ref 8.9–10.3)
Chloride: 110 mmol/L (ref 98–111)
Creatinine, Ser: 0.89 mg/dL (ref 0.61–1.24)
GFR, Estimated: >60 mL/min (ref >60)
Glucose, Bld: 80 mg/dL (ref 70–99)
Potassium: 3.7 mmol/L (ref 3.5–5.1)
Sodium: 140 mmol/L (ref 135–145)
Total Bilirubin: 1.6 mg/dL — ABNORMAL HIGH (ref 0.3–1.2)
Total Protein: 6.1 g/dL — ABNORMAL LOW (ref 6.5–8.1)

## 2023-01-08 LAB — CBC
HCT: 34.2 % — ABNORMAL LOW (ref 39.0–52.0)
Hemoglobin: 10.9 g/dL — ABNORMAL LOW (ref 13.0–17.0)
MCH: 30.4 pg (ref 26.0–34.0)
MCHC: 31.9 g/dL (ref 30.0–36.0)
MCV: 95.3 fL (ref 80.0–100.0)
Platelets: 96 10*3/uL — ABNORMAL LOW (ref 150–400)
RBC: 3.59 MIL/uL — ABNORMAL LOW (ref 4.22–5.81)
RDW: 15.5 % (ref 11.5–15.5)
WBC: 3.2 10*3/uL — ABNORMAL LOW (ref 4.0–10.5)
nRBC: 0 % (ref 0.0–0.2)

## 2023-01-08 LAB — TROPONIN I (HIGH SENSITIVITY): Troponin I (High Sensitivity): 17 ng/L (ref <18)

## 2023-01-08 LAB — PROTIME-INR
INR: 1.3 — ABNORMAL HIGH (ref 0.8–1.2)
Prothrombin Time: 16.2 seconds — ABNORMAL HIGH (ref 11.4–15.2)

## 2023-01-08 LAB — BRAIN NATRIURETIC PEPTIDE: B Natriuretic Peptide: 434 pg/mL — ABNORMAL HIGH (ref 0.0–100.0)

## 2023-01-08 MED ORDER — IOHEXOL 350 MG/ML SOLN
100.0000 mL | Freq: Once | INTRAVENOUS | Status: AC | PRN
  Administered 2023-01-08: 100 mL via INTRAVENOUS

## 2023-01-08 NOTE — ED Notes (Signed)
Pt requested to use bathroom and change clothes prior to RN starting IV/obtaining blood work.
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
## 2023-01-08 NOTE — ED Triage Notes (Signed)
Pt brought in via ems from a restaurant.  Pt has abd pain.  Pt reports sharp stabbing pain.  Pt alert.

## 2023-01-09 ENCOUNTER — Encounter: Payer: Self-pay | Admitting: Emergency Medicine

## 2023-01-09 ENCOUNTER — Other Ambulatory Visit: Payer: Self-pay

## 2023-01-09 ENCOUNTER — Emergency Department
Admission: EM | Admit: 2023-01-09 | Discharge: 2023-01-09 | Disposition: A | Discharge status: Home / Self Care | Payer: 59 | Source: Home / Self Care | Attending: Emergency Medicine | Admitting: Emergency Medicine

## 2023-01-09 DIAGNOSIS — R42 Dizziness and giddiness: Secondary | ICD-10-CM | POA: Insufficient documentation

## 2023-01-09 DIAGNOSIS — R109 Unspecified abdominal pain: Secondary | ICD-10-CM | POA: Diagnosis not present

## 2023-01-09 LAB — URINE DRUG SCREEN, QUALITATIVE (ARMC ONLY)
Amphetamines, Ur Screen: NOT DETECTED
Barbiturates, Ur Screen: NOT DETECTED
Benzodiazepine, Ur Scrn: NOT DETECTED
Cannabinoid 50 Ng, Ur ~~LOC~~: NOT DETECTED
Cocaine Metabolite,Ur ~~LOC~~: NOT DETECTED
MDMA (Ecstasy)Ur Screen: NOT DETECTED
Methadone Scn, Ur: NOT DETECTED
Opiate, Ur Screen: NOT DETECTED
Phencyclidine (PCP) Ur S: NOT DETECTED
Tricyclic, Ur Screen: NOT DETECTED

## 2023-01-09 LAB — URINALYSIS, ROUTINE W REFLEX MICROSCOPIC
Bacteria, UA: NONE SEEN
Bilirubin Urine: NEGATIVE
Glucose, UA: NEGATIVE mg/dL
Hgb urine dipstick: NEGATIVE
Ketones, ur: NEGATIVE mg/dL
Leukocytes,Ua: NEGATIVE
Nitrite: NEGATIVE
Protein, ur: 30 mg/dL — AB
Specific Gravity, Urine: 1.02 (ref 1.005–1.030)
pH: 7 (ref 5.0–8.0)

## 2023-01-09 LAB — ETHANOL: Alcohol, Ethyl (B): <10 mg/dL (ref <10)

## 2023-01-09 LAB — AMMONIA: Ammonia: 41 umol/L — ABNORMAL HIGH (ref 9–35)

## 2023-01-09 LAB — TROPONIN I (HIGH SENSITIVITY): Troponin I (High Sensitivity): 17 ng/L (ref <18)

## 2023-01-09 MED ORDER — LOPERAMIDE HCL 2 MG PO CAPS
4.0000 mg | ORAL_CAPSULE | Freq: Once | ORAL | Status: AC
  Administered 2023-01-09: 4 mg via ORAL
  Filled 2023-01-09: qty 2

## 2023-01-09 MED ORDER — MORPHINE SULFATE (PF) 4 MG/ML IV SOLN
6.0000 mg | Freq: Once | INTRAVENOUS | Status: AC
  Administered 2023-01-09: 6 mg via INTRAVENOUS
  Filled 2023-01-09: qty 2

## 2023-01-09 MED ORDER — LACTULOSE 20 GM/30ML PO SOLN: 30.0000 mL | Freq: Two times a day (BID) | ORAL | 2 refills | Status: DC

## 2023-01-09 MED ORDER — CITALOPRAM HYDROBROMIDE 20 MG PO TABS: 20.0000 mg | ORAL_TABLET | Freq: Every day | ORAL | 1 refills | Status: DC

## 2023-01-09 MED ORDER — ALUM & MAG HYDROXIDE-SIMETH 200-200-20 MG/5ML PO SUSP
30.0000 mL | Freq: Once | ORAL | Status: AC
  Administered 2023-01-09: 30 mL via ORAL
  Filled 2023-01-09: qty 30

## 2023-01-09 MED ORDER — QUETIAPINE FUMARATE 50 MG PO TABS: 50.0000 mg | ORAL_TABLET | Freq: Every day | ORAL | 1 refills | Status: DC

## 2023-01-09 MED ORDER — HYDROCORTISONE 10 MG PO TABS: 10.0000 mg | ORAL_TABLET | Freq: Every day | ORAL | 1 refills | Status: DC

## 2023-01-09 MED ORDER — FUROSEMIDE 10 MG/ML IJ SOLN
60.0000 mg | Freq: Once | INTRAMUSCULAR | Status: AC
  Administered 2023-01-09: 60 mg via INTRAVENOUS
  Filled 2023-01-09: qty 8

## 2023-01-09 MED ORDER — LOSARTAN POTASSIUM 50 MG PO TABS: 50.0000 mg | ORAL_TABLET | Freq: Every day | ORAL | 1 refills | Status: DC

## 2023-01-09 MED ORDER — PANTOPRAZOLE SODIUM 40 MG PO TBEC: 40.0000 mg | DELAYED_RELEASE_TABLET | Freq: Every day | ORAL | 1 refills | Status: DC

## 2023-01-09 MED ORDER — FUROSEMIDE 40 MG PO TABS: 40.0000 mg | ORAL_TABLET | Freq: Every day | ORAL | 1 refills | Status: DC

## 2023-01-09 MED ORDER — DOXEPIN HCL 50 MG PO CAPS: 50.0000 mg | ORAL_CAPSULE | Freq: Every day | ORAL | 1 refills | Status: DC

## 2023-01-09 MED ORDER — EMPAGLIFLOZIN 10 MG PO TABS: 10.0000 mg | ORAL_TABLET | Freq: Every day | ORAL | 1 refills | Status: DC

## 2023-01-09 MED ORDER — ATORVASTATIN CALCIUM 40 MG PO TABS: 40.0000 mg | ORAL_TABLET | Freq: Every day | ORAL | 1 refills | Status: DC

## 2023-01-09 NOTE — ED Provider Notes (Signed)
[facility] Provider Note    Event Date/Time   First MD Initiated Contact with Patient 01/09/23 [phone]     (approximate)   History   Dizziness   HPI [name] is a 72 y.o. male well-known to the emergency department with 16 ED visits within the last 6 months.  He has extensive chronic medical issues but is also homeless and presents frequently for evaluation of chronic issues and probable secondary gain.  He was discharged a few hours ago after a thorough evaluation.  He has been sitting in the waiting room for the bus line to start running this morning.  He was observed by the triage nurse walking around the emergency department, going to and from the bathroom without any distress, and exhibiting no concerning signs or symptoms.  He came up to the triage desk and asked them to check his blood pressure because he said that he felt dizzy after going to the bathroom and he thinks the Lasix he received during his recent visit because his blood pressure too drop too low too fast.  He was told that they could not check vital signs unless he checked in, so he checked himself back in.  He said that he thought he might "fall out".  However he acknowledges that he has been walking around for the last few hours without any difficulty.  He said that may be he just lets his anxiety get the better of him.  He acknowledged that not having at home is very difficult for him and that [place].  He is not having any other new or different symptoms, no pain, no shortness of breath.     Physical Exam   Triage Vital Signs: ED Triage Vitals [01/09/23 0611]  Enc Vitals Group     BP (!) 167/70     Pulse Rate (!) 58     Resp 18     Temp 97.8 F (36.6 C)     Temp Source Oral     SpO2 98 %     Weight 95 kg (209 lb 7 oz)     Height 1.676 m (5\' 6" )     Head Circumference      Peak Flow      Pain Score 0     Pain Loc       Pain Edu?      Excl. in GC?     Most recent vital signs: Vitals:   01/09/23 0611  BP: (!) 167/70  Pulse: (!) 58  Resp: 18  Temp: 97.8 F (36.6 C)  SpO2: 98%    General: Awake, no distress.  Disheveled. CV:  Good peripheral perfusion.  Regular rate and rhythm. Resp:  Normal effort. Speaking easily and comfortably, no accessory muscle usage nor intercostal retractions.   Abd:  No distention.  Other:  Ambulating without any apparent difficulty.  No focal neurological deficits.   ED Results / Procedures / Treatments   Labs (all labs ordered are listed, but only abnormal results are displayed) Labs Reviewed - No data to display   PROCEDURES:  Critical Care performed: No  Procedures    IMPRESSION / MDM / ASSESSMENT AND PLAN / ED COURSE  I reviewed the triage vital signs and the nursing notes.  Differential diagnosis includes, but is not limited to, secondary gain, anxiety, hypotension.  Patient's presentation is most consistent with exacerbation of chronic illness.   Vital signs stable.  Blood pressure remains consistent with his blood pressures while he was last here just a few hours ago.  No focal neurological deficits or acute symptoms.  I provided reassurance and encouraged him to follow-up as an outpatient.  No evidence that he requires additional workup at this time.         FINAL CLINICAL IMPRESSION(S) / ED DIAGNOSES   Final diagnoses:  Dizziness     Rx / DC Orders   ED Discharge Orders     None        Note:  This document was prepared using Dragon voice recognition software and may include unintentional dictation errors.   Loleta Rose, MD 01/09/23 [phone]

## 2023-01-09 NOTE — Discharge Instructions (Signed)
Your workup in the Emergency Department today was reassuring.  We did not find any specific abnormalities.  We recommend you drink plenty of fluids, take your regular medications and/or any new ones prescribed today, and follow up with the doctor(s) listed in these documents as recommended.  Return to the Emergency Department if you develop new or worsening symptoms that concern you.  

## 2023-01-09 NOTE — ED Triage Notes (Signed)
Pt arrived from the lobby where he has been sitting since he was discharged.  Pt has been up several times going to the bathroom, states he was given IV lasix and is now requesting to have his blood pressure checked, when advised pt cannot have his blood pressure checked unless he was a patient and was told that if he wanted to check back in if he was concerned then he could do so.   No distress noted at this time.

## 2023-01-09 NOTE — ED Notes (Signed)
Dr. York Cerise in triage 1 to see patient

## 2023-01-09 NOTE — ED Notes (Signed)
Patient has had 4 episodes of diarrhea. Pt hooks and unhooks self from monitor and ambulates to bedside toilet independently. Reports diarrhea has been ongoing and frequent. Pt currently in bed with bed low and locked and side rails raised. Call bell in reach. Breathing unlabored speaking in full sentences with noted symmetric chest rise and fall. Pt watching tv and denies further needs at this time.

## 2023-01-09 NOTE — ED Notes (Signed)
Dc instructions discussed with patient, informed pt that I would be pushing him outside at this time so he could catch the bus, pt states its too early for the bus advised the pt that the bus comes at 650 and it is 0628. Pt stated last time the bus didn't come until after 8am and states he just pulled up the bus schedule and says 7:14, informed pt that he can go out to the bus stop to wait for the bus to come at 0650.

## 2023-01-09 NOTE — ED Provider Notes (Signed)
Patient received in signout from Dr. Fuller Plan pending CT imaging for evaluation of chronic intermittent abdominal pain..  No evidence of acute pathology to explain patient's symptoms.  Anasarca is noted.  He reports compliance with his Lasix.  Will provide extra dose of IV furosemide.  He reports feeling better and is asking for refills of all of his prescriptions as [place]. I send these scripts, as requested. Suitable for outpatient management.    Delton Prairie, MD 01/09/23 [phone]

## 2023-01-12 ENCOUNTER — Emergency Department: Payer: 59

## 2023-01-12 ENCOUNTER — Other Ambulatory Visit: Payer: Self-pay

## 2023-01-12 ENCOUNTER — Emergency Department
Admission: EM | Admit: 2023-01-12 | Discharge: 2023-01-13 | Disposition: A | Discharge status: Home / Self Care | Payer: 59 | Attending: Emergency Medicine | Admitting: Emergency Medicine

## 2023-01-12 DIAGNOSIS — R45851 Suicidal ideations: Secondary | ICD-10-CM | POA: Insufficient documentation

## 2023-01-12 DIAGNOSIS — Z5900 Homelessness unspecified: Secondary | ICD-10-CM | POA: Insufficient documentation

## 2023-01-12 DIAGNOSIS — R079 Chest pain, unspecified: Secondary | ICD-10-CM | POA: Diagnosis present

## 2023-01-12 DIAGNOSIS — E119 Type 2 diabetes mellitus without complications: Secondary | ICD-10-CM | POA: Diagnosis not present

## 2023-01-12 DIAGNOSIS — I5030 Unspecified diastolic (congestive) heart failure: Secondary | ICD-10-CM | POA: Diagnosis not present

## 2023-01-12 DIAGNOSIS — I110 Hypertensive heart disease with heart failure: Secondary | ICD-10-CM | POA: Diagnosis not present

## 2023-01-12 DIAGNOSIS — R0602 Shortness of breath: Secondary | ICD-10-CM | POA: Insufficient documentation

## 2023-01-12 DIAGNOSIS — M549 Dorsalgia, unspecified: Secondary | ICD-10-CM | POA: Diagnosis not present

## 2023-01-12 DIAGNOSIS — Z8616 Personal history of COVID-19: Secondary | ICD-10-CM | POA: Insufficient documentation

## 2023-01-12 DIAGNOSIS — G8929 Other chronic pain: Secondary | ICD-10-CM | POA: Diagnosis not present

## 2023-01-12 DIAGNOSIS — Z87891 Personal history of nicotine dependence: Secondary | ICD-10-CM | POA: Diagnosis not present

## 2023-01-12 DIAGNOSIS — R059 Cough, unspecified: Secondary | ICD-10-CM | POA: Diagnosis not present

## 2023-01-12 DIAGNOSIS — M5441 Lumbago with sciatica, right side: Secondary | ICD-10-CM | POA: Diagnosis not present

## 2023-01-12 DIAGNOSIS — J4489 Other specified chronic obstructive pulmonary disease: Secondary | ICD-10-CM | POA: Insufficient documentation

## 2023-01-12 DIAGNOSIS — M5442 Lumbago with sciatica, left side: Secondary | ICD-10-CM | POA: Diagnosis not present

## 2023-01-12 DIAGNOSIS — F332 Major depressive disorder, 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 psychotic features: Secondary | ICD-10-CM | POA: Insufficient documentation

## 2023-01-12 LAB — BASIC METABOLIC PANEL
Anion gap: 5 (ref 5–15)
BUN: 17 mg/dL (ref 8–23)
CO2: 27 mmol/L (ref 22–32)
Calcium: 8.9 mg/dL (ref 8.9–10.3)
Chloride: 106 mmol/L (ref 98–111)
Creatinine, Ser: 1.08 mg/dL (ref 0.61–1.24)
GFR, Estimated: >60 mL/min (ref >60)
Glucose, Bld: 131 mg/dL — ABNORMAL HIGH (ref 70–99)
Potassium: 3.2 mmol/L — ABNORMAL LOW (ref 3.5–5.1)
Sodium: 138 mmol/L (ref 135–145)

## 2023-01-12 LAB — CBC
HCT: 30.7 % — ABNORMAL LOW (ref 39.0–52.0)
Hemoglobin: 10 g/dL — ABNORMAL LOW (ref 13.0–17.0)
MCH: 30.7 pg (ref 26.0–34.0)
MCHC: 32.6 g/dL (ref 30.0–36.0)
MCV: 94.2 fL (ref 80.0–100.0)
Platelets: 84 10*3/uL — ABNORMAL LOW (ref 150–400)
RBC: 3.26 MIL/uL — ABNORMAL LOW (ref 4.22–5.81)
RDW: 15.8 % — ABNORMAL HIGH (ref 11.5–15.5)
WBC: 2.9 10*3/uL — ABNORMAL LOW (ref 4.0–10.5)
nRBC: 0 % (ref 0.0–0.2)

## 2023-01-12 LAB — TROPONIN I (HIGH SENSITIVITY): Troponin I (High Sensitivity): 16 ng/L (ref <18)

## 2023-01-12 MED ORDER — LIDOCAINE 5 % EX PTCH
1.0000 | MEDICATED_PATCH | CUTANEOUS | Status: DC
  Administered 2023-01-12: 1 via TRANSDERMAL
  Filled 2023-01-12: qty 1

## 2023-01-12 NOTE — ED Triage Notes (Signed)
Pt to ED from [facility] via [facility] for CP.   EMS advised pt has SR on monitor. 63 97% on RA BGL 159 0.4 SL NTG 324 mg ASA 18 G RFA  Pt has multiple complaint at this time. Staff is familiar with patient.  Pt is CAOx4, in no acute distress.
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
## 2023-01-13 ENCOUNTER — Emergency Department (EMERGENCY_DEPARTMENT_HOSPITAL)
Admission: EM | Admit: 2023-01-13 | Discharge: 2023-01-14 | Disposition: A | Discharge status: Home / Self Care | Payer: 59 | Source: Home / Self Care | Attending: Emergency Medicine | Admitting: Emergency Medicine

## 2023-01-13 ENCOUNTER — Other Ambulatory Visit: Payer: Self-pay

## 2023-01-13 DIAGNOSIS — J449 Chronic obstructive pulmonary disease, unspecified: Secondary | ICD-10-CM | POA: Insufficient documentation

## 2023-01-13 DIAGNOSIS — F332 Major depressive disorder, 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 psychotic features: Secondary | ICD-10-CM | POA: Diagnosis present

## 2023-01-13 DIAGNOSIS — R45851 Suicidal ideations: Secondary | ICD-10-CM

## 2023-01-13 DIAGNOSIS — E119 Type 2 diabetes mellitus without complications: Secondary | ICD-10-CM | POA: Insufficient documentation

## 2023-01-13 DIAGNOSIS — Z8616 Personal history of COVID-19: Secondary | ICD-10-CM | POA: Insufficient documentation

## 2023-01-13 DIAGNOSIS — Z87891 Personal history of nicotine dependence: Secondary | ICD-10-CM | POA: Insufficient documentation

## 2023-01-13 DIAGNOSIS — M5441 Lumbago with sciatica, right side: Secondary | ICD-10-CM | POA: Insufficient documentation

## 2023-01-13 DIAGNOSIS — M5442 Lumbago with sciatica, left side: Secondary | ICD-10-CM | POA: Insufficient documentation

## 2023-01-13 DIAGNOSIS — I110 Hypertensive heart disease with heart failure: Secondary | ICD-10-CM | POA: Insufficient documentation

## 2023-01-13 DIAGNOSIS — I5030 Unspecified diastolic (congestive) heart failure: Secondary | ICD-10-CM | POA: Insufficient documentation

## 2023-01-13 DIAGNOSIS — G8929 Other chronic pain: Secondary | ICD-10-CM | POA: Insufficient documentation

## 2023-01-13 DIAGNOSIS — J45909 Unspecified asthma, uncomplicated: Secondary | ICD-10-CM | POA: Insufficient documentation

## 2023-01-13 DIAGNOSIS — R52 Pain, unspecified: Secondary | ICD-10-CM | POA: Insufficient documentation

## 2023-01-13 DIAGNOSIS — Z5900 Homelessness unspecified: Secondary | ICD-10-CM | POA: Insufficient documentation

## 2023-01-13 DIAGNOSIS — R079 Chest pain, unspecified: Secondary | ICD-10-CM | POA: Diagnosis not present

## 2023-01-13 LAB — TROPONIN I (HIGH SENSITIVITY): Troponin I (High Sensitivity): 16 ng/L (ref <18)

## 2023-01-13 NOTE — ED Notes (Signed)
PT brought to ed rm 13H at this time, this RN now assuming care.  

## 2023-01-13 NOTE — ED Triage Notes (Signed)
Pt to ED from [facility] for back pain. Pt IS caoX4 and in no acute distress, ambulatory in triage and placed in wheel chair.

## 2023-01-14 DIAGNOSIS — R079 Chest pain, unspecified: Secondary | ICD-10-CM | POA: Diagnosis not present

## 2023-01-14 DIAGNOSIS — F332 Major depressive disorder, 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 psychotic features: Secondary | ICD-10-CM

## 2023-01-14 DIAGNOSIS — R52 Pain, unspecified: Secondary | ICD-10-CM | POA: Insufficient documentation

## 2023-01-14 DIAGNOSIS — M549 Dorsalgia, unspecified: Secondary | ICD-10-CM

## 2023-01-14 DIAGNOSIS — R45851 Suicidal ideations: Secondary | ICD-10-CM

## 2023-01-14 MED ORDER — KETOROLAC TROMETHAMINE 30 MG/ML IJ SOLN
30.0000 mg | Freq: Once | INTRAMUSCULAR | Status: AC
  Administered 2023-01-14: 30 mg via INTRAMUSCULAR
  Filled 2023-01-14: qty 1

## 2023-01-14 MED ORDER — LIDOCAINE 5 % EX PTCH
1.0000 | MEDICATED_PATCH | CUTANEOUS | Status: DC
  Administered 2023-01-14: 1 via TRANSDERMAL
  Filled 2023-01-14: qty 1

## 2023-01-14 NOTE — Consult Note (Addendum)
[facility] Face-to-Face Psychiatry Consult   Reason for Consult:  SI Referring Physician:  Chesley Noon, MD Patient Identification: [name] MRN:  [id number] Principal Diagnosis: <principal problem not specified> Diagnosis:  Active Problems:   Major depressive disorder, 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 psychotic features (HCC)   Suicidal ideation   Acute pain   Total Time spent with patient: 30 minutes  Subjective:   [name] is a 72 y.o. male patient admitted with "Acute pain".  HPI:  Patient seen and chart reviewed. Case dicussed with Dr. Marlou Porch. [name], a 72 year old Caucasian male presented to the emergency department voluntarily due to back pain and suicidal ideation.  He expresses frustration and irritation, feeling that he is being held against his will.  He denies any current suicidal ideation, clarifying his previous statements is merely expressing his feelings without intent to act on them. The patient is well-known to the emergency department with a history of recent visits to the emergency department for various complaints including abdominal pain on May 14 and chest pain on May 18, both of which led to discharge without admission.  He denies use of illicit substances, alcohol, and denies any homicidal ideation or delusional thoughts.  The patient also denies experiencing any auditory or visual hallucinations. He expresses concern over his medications, which were lost in the [place], and mentions ongoing issues with his insurance company regarding this matter. He states that he is homeless and faces logistical challenges, such as a 2-hour bus ride, to obtain a doctor's appointment scheduled for this afternoon to get his medications.  He is alert and oriented during the evaluation, showing no signs of acute psychosis.   Past Psychiatric History:  MDD   Risk to Self:   Risk to Others:   Prior Inpatient Therapy:   Prior Outpatient Therapy:    Past Medical History:  Past  Medical History:  Diagnosis Date   Asthma    CHF (congestive heart failure) (HCC)    COPD (chronic obstructive pulmonary disease) (HCC)    COVID-19 03/2020   diagnosed in August 2021   Diabetes mellitus without complication (HCC)    Homelessness    Hypertension    Infestation by bed bug    Migraine    Obesity    Sleep apnea     Past Surgical History:  Procedure Laterality Date   CARDIAC CATHETERIZATION     CHOLECYSTECTOMY     EYE SURGERY     INNER EAR SURGERY     NOSE SURGERY     XI ROBOTIC LAPAROSCOPIC ASSISTED APPENDECTOMY N/A 11/05/2022   Procedure: XI ROBOTIC LAPAROSCOPIC ASSISTED APPENDECTOMY;  Surgeon: Carolan Shiver, MD;  Location: [facility] ORS;  Service: General;  Laterality: N/A;   Family History:  Family History  Problem Relation Age of Onset   Hypertension Mother    Heart disease Mother    Heart failure Maternal Grandmother    Family Psychiatric  History: None reported  Social History:  Social History   Substance and Sexual Activity  Alcohol Use Never     Social History   Substance and Sexual Activity  Drug Use Never    Social History   Socioeconomic History   Marital status: Single    Spouse name: Not on file   Number of children: 2   Years of education: 12   Highest education level: 12th grade  Occupational History   Occupation: retired  Tobacco Use   Smoking status: Former   Smokeless tobacco: Never  Advertising account planner  Vaping Use: Not on file  Substance and Sexual Activity   Alcohol use: Never   Drug use: Never   Sexual activity: Never    Birth control/protection: None  Other Topics Concern   Not on file  Social History Narrative   Not on file   Social Determinants of Health   Financial Resource Strain: High Risk (04/27/2022)   Overall Financial Resource Strain (CARDIA)    Difficulty of Paying Living Expenses: Very hard  Food Insecurity: Food Insecurity Present (12/24/2022)   Hunger Vital Sign    Worried About Running Out of Food in  the Last Year: Sometimes true    Ran Out of Food in the Last Year: Sometimes true  Transportation Needs: Unmet Transportation Needs (12/24/2022)   PRAPARE - Transportation    Lack of Transportation (Medical): Yes    Lack of Transportation (Non-Medical): Yes  Physical Activity: Sufficiently Active (04/16/2018)   Exercise Vital Sign    Days of Exercise per Week: 7 days    Minutes of Exercise per Session: 30 min  Stress: Stress Concern Present (04/16/2018)   [employer] of Occupational Health - Occupational Stress Questionnaire    Feeling of Stress : Very much  Social Connections: Moderately Isolated (04/30/2018)   Social Connection and Isolation Panel [NHANES]    Frequency of Communication with Friends and Family: Twice a week    Frequency of Social Gatherings with Friends and Family: Never    Attends Religious Services: More than 4 times per year    Active Member of [employer] or Organizations: No    Attends Banker Meetings: Never    Marital Status: Widowed   Additional Social History:    Allergies:   Allergies  Allergen Reactions   Bee Venom Anaphylaxis   Codeine Itching    Only itching. Recently allergy tested at [facility]   Novocain [Procaine]    Sulfa Antibiotics Other (See Comments)    Labs:  Results for orders placed or performed during the hospital encounter of 01/12/23 (from the past 48 hour(s))  Basic metabolic panel     Status: Abnormal   Collection Time: 01/12/23  7:57 PM  Result Value Ref Range   Sodium 138 135 - 145 mmol/L   Potassium 3.2 (L) 3.5 - 5.1 mmol/L   Chloride 106 98 - 111 mmol/L   CO2 27 22 - 32 mmol/L   Glucose, Bld 131 (H) 70 - 99 mg/dL    Comment: Glucose reference range applies only to samples taken after fasting for at least 8 hours.   BUN 17 8 - 23 mg/dL   Creatinine, Ser 0.27 0.61 - 1.24 mg/dL   Calcium 8.9 8.9 - 25.3 mg/dL   GFR, Estimated >66 >44 mL/min    Comment: (NOTE) Calculated using the CKD-EPI Creatinine Equation (2021)     Anion gap 5 5 - 15    Comment: Performed at [facility], [address] [place], [place] [address]  CBC     Status: Abnormal   Collection Time: 01/12/23  7:57 PM  Result Value Ref Range   WBC 2.9 (L) 4.0 - 10.5 K/uL   RBC 3.26 (L) 4.22 - 5.81 MIL/uL   Hemoglobin 10.0 (L) 13.0 - 17.0 g/dL   HCT 25.9 (L) 56.3 - 87.5 %   MCV 94.2 80.0 - 100.0 fL   MCH 30.7 26.0 - 34.0 pg   MCHC 32.6 30.0 - 36.0 g/dL   RDW 64.3 (H) 32.9 - 51.8 %   Platelets 84 (L)  150 - 400 K/uL    Comment: Immature Platelet Fraction may be clinically indicated, consider ordering this additional test [id number] CONSISTENT WITH PREVIOUS RESULT    nRBC 0.0 0.0 - 0.2 %    Comment: Performed at [facility], [address], [place], [place] [address]  Troponin I (High Sensitivity)     Status: None   Collection Time: 01/12/23  7:57 PM  Result Value Ref Range   Troponin I (High Sensitivity) 16 <18 ng/L    Comment: (NOTE) Elevated high sensitivity troponin I (hsTnI) values and significant  changes across serial measurements may suggest ACS but many other  chronic and acute conditions are known to elevate hsTnI results.  Refer to the "Links" section for chest pain algorithms and additional  guidance. Performed at [facility], [address] [place], [place] [address]   Troponin I (High Sensitivity)     Status: None   Collection Time: 01/12/23 11:49 PM  Result Value Ref Range   Troponin I (High Sensitivity) 16 <18 ng/L    Comment: (NOTE) Elevated high sensitivity troponin I (hsTnI) values and significant  changes across serial measurements may suggest ACS but many other  chronic and acute conditions are known to elevate hsTnI results.  Refer to the "Links" section for chest pain algorithms and additional  guidance. Performed at [facility], [address] [place], [place] [address]     Current Facility-Administered Medications  Medication Dose Route Frequency  Provider Last Rate Last Admin   lidocaine (LIDODERM) 5 % 1 patch  1 patch Transdermal Q24H Chesley Noon, MD   1 patch at 01/14/23 0030   Current Outpatient Medications  Medication Sig Dispense Refill   albuterol (VENTOLIN HFA) 108 (90 Base) MCG/ACT inhaler Inhale 2 puffs into the lungs every 6 (six) hours as needed for wheezing or shortness of breath. 1 each 5   ascorbic acid (VITAMIN C) 500 MG tablet Take 1 tablet (500 mg total) by mouth daily.     aspirin EC 81 MG tablet Take 1 tablet (81 mg total) by mouth daily. Swallow whole. 30 tablet 5   atorvastatin (LIPITOR) 40 MG tablet Take 1 tablet (40 mg total) by mouth daily. 30 tablet 5   atorvastatin (LIPITOR) 40 MG tablet Take 1 tablet (40 mg total) by mouth daily. 30 tablet 1   citalopram (CELEXA) 20 MG tablet Take 1 tablet (20 mg total) by mouth daily. 30 tablet 5   citalopram (CELEXA) 20 MG tablet Take 1 tablet (20 mg total) by mouth daily. 30 tablet 1   doxepin (SINEQUAN) 50 MG capsule Take 1 capsule (50 mg total) by mouth at bedtime. 30 capsule 5   doxepin (SINEQUAN) 50 MG capsule Take 1 capsule (50 mg total) by mouth at bedtime. 30 capsule 1   empagliflozin (JARDIANCE) 10 MG TABS tablet Take 1 tablet (10 mg total) by mouth daily before breakfast. 30 tablet 1   furosemide (LASIX) 40 MG tablet Take 1 tablet (40 mg total) by mouth daily. 30 tablet 1   glucosamine-chondroitin 500-400 MG tablet Take 1 tablet by mouth 3 (three) times daily.     hydrocortisone (CORTEF) 10 MG tablet Take 1 tablet (10 mg total) by mouth daily. 30 tablet 1   hydrocortisone (CORTEF) 10 MG tablet Take 1 tablet (10 mg total) by mouth daily. 30 tablet 1   hydrocortisone (CORTEF) 5 MG tablet Take 1 tablet (5 mg total) by mouth 2 (two) times daily. 60 tablet 1   JARDIANCE  10 MG TABS tablet Take 1 tablet (10 mg total) by mouth daily. 30 tablet 5   lactulose (CHRONULAC) 10 GM/15ML solution Take 30 mLs (20 g total) by mouth 2 (two) times daily. 236 mL 0   Lactulose 20  GM/30ML SOLN Take 30 mLs (20 g total) by mouth in the morning and at bedtime. 450 mL 2   losartan (COZAAR) 50 MG tablet Take 1 tablet (50 mg total) by mouth daily after breakfast. 30 tablet 5   losartan (COZAAR) 50 MG tablet Take 1 tablet (50 mg total) by mouth daily. 30 tablet 1   meclizine (ANTIVERT) 25 MG tablet Take 0.5 tablets (12.5 mg total) by mouth 2 (two) times daily as needed for up to 12 doses for dizziness. 6 tablet 0   metFORMIN (GLUCOPHAGE) 500 MG tablet Take 500 mg by mouth daily.     Multiple Vitamin (MULTIVITAMIN WITH MINERALS) TABS tablet Take 1 tablet by mouth daily.     nitroGLYCERIN (NITROSTAT) 0.4 MG SL tablet Place 1 tablet (0.4 mg total) under the tongue every 5 (five) minutes x 3 doses as needed for chest pain. 30 tablet 5   oxyCODONE (ROXICODONE) 5 MG immediate release tablet Take 1 tablet (5 mg total) by mouth every 8 (eight) hours as needed for up to 6 doses. 4 tablet 0   pantoprazole (PROTONIX) 20 MG tablet Take 1 tablet (20 mg total) by mouth daily. 30 tablet 5   pantoprazole (PROTONIX) 40 MG tablet Take 1 tablet (40 mg total) by mouth daily. 30 tablet 1   QUEtiapine (SEROQUEL) 50 MG tablet Take 1 tablet (50 mg total) by mouth at bedtime. 30 tablet 5   QUEtiapine (SEROQUEL) 50 MG tablet Take 1 tablet (50 mg total) by mouth at bedtime. 30 tablet 1   rifaximin (XIFAXAN) 550 MG TABS tablet Take 1 tablet (550 mg total) by mouth 2 (two) times daily. 60 tablet 1    Musculoskeletal: Strength & Muscle Tone: within normal limits Gait & Station: normal Patient leans: N/A            Psychiatric Specialty Exam:  Presentation  General Appearance:  Disheveled  Eye Contact: Good  Speech: Clear and Coherent  Speech Volume: Normal  Handedness: Right   Mood and Affect  Mood: Euphoric  Affect: Congruent   Thought Process  Thought Processes: Coherent  Descriptions of Associations:Intact  Orientation:Full (Time, Place and Person)  Thought  Content:WDL  History of Schizophrenia/Schizoaffective disorder:No  Duration of Psychotic Symptoms:Less than six months  Hallucinations:No data recorded Ideas of Reference:None  Suicidal Thoughts:No data recorded Homicidal Thoughts:No data recorded  Sensorium  Memory: Immediate Good; Recent Good  Judgment: Fair  Insight: Poor   Executive Functions  Concentration: Good  Attention Span: Good  Recall: Good  Fund of Knowledge: Poor  Language: Fair   Psychomotor Activity  Psychomotor Activity:No data recorded  Assets  Assets: Financial Resources/Insurance; Resilience   Sleep  Sleep:No data recorded  Physical Exam: Physical Exam Vitals and nursing note reviewed.  HENT:     Head: Normocephalic.     Nose: Nose normal.  Pulmonary:     Effort: Pulmonary effort is normal.  Musculoskeletal:        General: Normal range of motion.     Cervical back: Normal range of motion.  Neurological:     Mental Status: He is alert and oriented to person, place, and time.  Psychiatric:        Attention and Perception: Attention normal. He does not  perceive auditory or visual hallucinations.        Mood and Affect: Anxious: Irritable.        Speech: Speech normal.        Behavior: Behavior is cooperative.        Thought Content: Thought content is not paranoid or delusional. Thought content does not include homicidal or suicidal ideation.        Cognition and Memory: Cognition and memory normal.    ROS Blood pressure (!) 128/58, pulse 60, temperature 98 F (36.7 C), temperature source Oral, resp. rate 16, height 5\' 6"  (1.676 m), weight 95 kg, SpO2 99 %. Body mass index is 33.8 kg/m.  Treatment Plan Summary: Plan : 72 year old male presented to the emergency department voluntarily due to complaints of back pain and suicidal ideation.  He has a history of recent visits to the emergency department for abdominal pain and chest pain. There is no evidence of acute  psychosis, and he is not homicidal or suicidal.  He does not meet criteria for inpatient psychiatric hospitalization at this time.  IVC rescinded by Dr. Marlou Porch.  Plan reviewed with Dr. Derrill Kay, EDP.  Disposition:  No evidence of imminent risk to self or others at present.   Patient does not meet criteria for psychiatric inpatient admission. Supportive therapy provided about ongoing stressors. Discussed crisis plan, support from social network, calling 911, coming to the Emergency Department, and calling Suicide Hotline.  Norma Fredrickson, NP 01/14/2023 3:12 PM

## 2023-01-14 NOTE — Discharge Instructions (Signed)
Please seek medical attention and help for any thoughts about wanting to harm yourself, harm others, any concerning change in behavior, severe depression, inappropriate drug use or any other new or concerning symptoms. ° °

## 2023-01-14 NOTE — ED Notes (Addendum)
Pt states they are ready to leave. States they are going to see their family doctor this morning. MD made aware.

## 2023-01-14 NOTE — ED Notes (Signed)
IVC PENDING  CONSULT ?

## 2023-01-14 NOTE — ED Notes (Addendum)
Pt belongings obtained while pt was dressed out by 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

## 2023-01-14 NOTE — BH Assessment (Signed)
Comprehensive Clinical Assessment (CCA) Note  01/14/2023 [name] [id number]  Chief Complaint: Patient is a 72 year old male presenting to [facility] ED voluntarily. Per triage note Pt to ED from [facility] for back pain. Pt IS caoX4 and in no acute distress, ambulatory in triage and placed in wheel chair. During assessment patient appears alert and oriented x4, calm and cooperative. Patient reports "either they get rid of this pain or I'll get rid of me, I'm tired of being treated like a junkie." "I called the emergency nurse to my doctor's office and my doctor told me to call EMS." "The colder I get the worse the pain is." Patient reports continued SI with a plan "to split the vein the long way." Patient reports a diagnosis of PTSD "from seeing my dad die to being in jail." Patient reports that he lives with his daughter and her fiancee but it was reported in the past that the patient is homeless. Patient has presented to this ED on several occasions within the last few days with similar presentation. Patient reports continued SI, denies HI/AH/VH Chief Complaint  Patient presents with   Back Pain   Visit Diagnosis: Depression    CCA Screening, Triage and Referral (STR)  Patient Reported Information How did you hear about [place]? Self  Referral name: No data recorded Referral phone number: No data recorded  Whom do you see for routine medical problems? No data recorded Practice/Facility Name: No data recorded Practice/Facility Phone Number: No data recorded Name of Contact: No data recorded Contact Number: No data recorded Contact Fax Number: No data recorded Prescriber Name: No data recorded Prescriber Address (if known): No data recorded  What Is the Reason for Your Visit/Call Today? Pt to ED from [facility] for back pain. Pt IS caoX4 and in no acute distress, ambulatory in triage and placed in wheel chair.  How Long Has This Been Causing You Problems? > than 6 months  What Do You Feel  Would Help You the Most Today? Treatment for Depression or other mood problem; Food Assistance; Housing Assistance; Stress Management; Social Support   Have You Recently Been in Any Inpatient Treatment (Hospital/Detox/Crisis Center/28-Day Program)? No data recorded Name/Location of Program/Hospital:No data recorded How Long Were You There? No data recorded When Were You Discharged? No data recorded  Have You Ever Received Services From [facility] Before? No data recorded Who Do You See at [facility]? No data recorded  Have You Recently Had Any Thoughts About Hurting Yourself? Yes  Are You Planning to Commit Suicide/Harm Yourself At This time? Yes   Have you Recently Had Thoughts About Hurting Someone Karolee Ohs? No  Explanation: Reports wanting to cut himself vertically on his arms and bleed out.   Have You Used Any Alcohol or Drugs in the Past 24 Hours? No  How Long Ago Did You Use Drugs or Alcohol? No data recorded What Did You Use and How Much? No data recorded  Do You Currently Have a Therapist/Psychiatrist? No  Name of Therapist/Psychiatrist: No data recorded  Have You Been Recently Discharged From Any Office Practice or Programs? No  Explanation of Discharge From Practice/Program: No data recorded    CCA Screening Triage Referral Assessment Type of Contact: Face-to-Face  Is this Initial or Reassessment? No data recorded Date Telepsych consult ordered in [facility]:  No data recorded Time Telepsych consult ordered in [facility]:  No data recorded  Patient Reported Information Reviewed? No data recorded Patient Left Without Being Seen? No data recorded  Reason for Not Completing Assessment: No data recorded  Collateral Involvement: None provided   Does Patient Have a Court Appointed Legal Guardian? No data recorded Name and Contact of Legal Guardian: No data recorded If Minor and Not Living with Parent(s), Who has Custody? n/a  Is CPS involved or ever been involved? Never  Is  APS involved or ever been involved? Never   Patient Determined To Be At Risk for Harm To Self or Others Based on Review of Patient Reported Information or Presenting Complaint? Yes, for Self-Harm  Method: Plan with intent and identified person  Availability of Means: No data recorded Intent: Clearly intends on inflicting harm that could cause death (towards himself)  Notification Required: No data recorded Additional Information for Danger to Others Potential: No data recorded Additional Comments for Danger to Others Potential: No data recorded Are There Guns or Other Weapons in Your Home? No  Types of Guns/Weapons: No data recorded Are These Weapons Safely Secured?                            No data recorded Who Could Verify You Are Able To Have These Secured: No data recorded Do You Have any Outstanding Charges, Pending Court Dates, Parole/Probation? No data recorded Contacted To Inform of Risk of Harm To Self or Others: Family/Significant Other:   Location of Assessment: [facility] ED   Does Patient Present under Involuntary Commitment? No  IVC Papers Initial File Date: No data recorded  [place] of Residence: [place]   Patient Currently Receiving the Following Services: Not Receiving Services   Determination of Need: Emergent (2 hours)   Options For Referral: Inpatient Hospitalization     CCA Biopsychosocial Intake/Chief Complaint:  No data recorded Current Symptoms/Problems: No data recorded  Patient Reported Schizophrenia/Schizoaffective Diagnosis in Past: No   Strengths: Patient is able to communicate his needs  Preferences: No data recorded Abilities: No data recorded  Type of Services Patient Feels are Needed: No data recorded  Initial Clinical Notes/Concerns: No data recorded  Mental Health Symptoms Depression:   Fatigue; Hopelessness; Worthlessness   Duration of Depressive symptoms:  Greater than two weeks   Mania:   None   Anxiety:     Irritability; Restlessness   Psychosis:   None   Duration of Psychotic symptoms:  Less than six months   Trauma:   Avoids reminders of event   Obsessions:   None   Compulsions:   None   Inattention:   None   Hyperactivity/Impulsivity:   None   Oppositional/Defiant Behaviors:   None   Emotional Irregularity:   None   Other Mood/Personality Symptoms:   n/a    Mental Status Exam Appearance and self-care  Stature:   Average   Weight:   Overweight   Clothing:   Disheveled   Grooming:   Normal   Cosmetic use:   None   Posture/gait:   Normal   Motor activity:   Not Remarkable   Sensorium  Attention:   Normal   Concentration:   Normal   Orientation:   X5   Recall/memory:   Normal   Affect and Mood  Affect:   Appropriate   Mood:   Irritable   Relating  Eye contact:   Normal   Facial expression:   Responsive   Attitude toward examiner:   Cooperative   Thought and Language  Speech flow:  Clear and Coherent   Thought content:   Appropriate  to Mood and Circumstances   Preoccupation:   None   Hallucinations:   None   Organization:  No data recorded  [employer] of Knowledge:   Fair   Intelligence:   Average   Abstraction:   Normal   Judgement:   Good   Reality Testing:   Adequate   Insight:   Good   Decision Making:   Normal   Social Functioning  Social Maturity:   Responsible   Social Judgement:   Normal   Stress  Stressors:   Housing; Surveyor, quantity; Illness   Coping Ability:   Normal   Skill Deficits:   None   Supports:   Family     Religion: Religion/Spirituality Are You A Religious Person?: No  Leisure/Recreation: Leisure / Recreation Do You Have Hobbies?: No  Exercise/Diet: Exercise/Diet Do You Exercise?: No Have You Gained or Lost A Significant Amount of Weight in the Past Six Months?: No Do You Follow a Special Diet?: No Do You Have Any Trouble Sleeping?:  No   CCA Employment/Education Employment/Work Situation: Employment / Work Situation Employment Situation: On disability Why is Patient on Disability: Physical Health How Long has Patient Been on Disability: Unknown Patient's Job has Been Impacted by Current Illness: No 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) Did You Receive Any Psychiatric Treatment/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  Education: Education Is Patient Currently Attending School?: No Last Grade Completed: 12 Did You Attend College?: No Did You Have An Individualized Education Program (IIEP): No Did You Have Any Difficulty At School?: No Patient's Education Has Been Impacted by Current Illness: No   CCA Family/Childhood History Family and Relationship History: Family history Marital status: Single Does patient have children?: No  Childhood History:  Childhood History Did patient suffer any verbal/emotional/physical/sexual abuse as a child?: No Did patient suffer from severe childhood neglect?: No Has patient ever been sexually abused/assaulted/raped as an adolescent or adult?: No Was the patient ever a victim of a crime or a disaster?: No Witnessed domestic violence?: No Has patient been affected by domestic violence as an adult?: No  Child/Adolescent Assessment:     CCA Substance Use Alcohol/Drug Use: Alcohol / Drug Use Pain Medications: See MAR Prescriptions: See MAR Over the Counter: See MAR History of alcohol / drug use?: No history of alcohol / drug abuse                         ASAM's:  Six Dimensions of Multidimensional Assessment  Dimension 1:  Acute Intoxication and/or Withdrawal Potential:      Dimension 2:  Biomedical Conditions and Complications:      Dimension 3:  Emotional, Behavioral, or Cognitive Conditions and Complications:     Dimension 4:  Readiness to Change:     Dimension 5:  Relapse, Continued use, or Continued Problem  Potential:     Dimension 6:  Recovery/Living Environment:     ASAM Severity Score:    ASAM Recommended Level of Treatment:     Substance use Disorder (SUD)    Recommendations for Services/Supports/Treatments:    DSM5 Diagnoses: Patient Active Problem List   Diagnosis Date Noted   Adrenal insufficiency (HCC) 12/26/2022   Orthostatic hypotension 12/24/2022   Junctional rhythm 12/24/2022   Rib pain on right side 12/24/2022   Hepatitis B infection 12/24/2022   Myocardial ischemia 12/24/2022   Acute appendicitis 11/05/2022   Pancytopenia (HCC) 11/05/2022   Sinus bradycardia  11/05/2022   Acute metabolic encephalopathy 09/22/2022   Liver lesion 09/22/2022   Liver cirrhosis (HCC) 09/22/2022   Hyperlipidemia, unspecified 09/22/2022   Diabetes mellitus without complication (HCC) 09/22/2022   Obesity (BMI 30-39.9) 09/22/2022   Acute hepatic encephalopathy (HCC) 09/22/2022   MDD (major depressive disorder), recurrent severe, 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   Major depressive disorder, 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 psychotic features (HCC) 09/15/2022   Protein-calorie malnutrition, severe (HCC) 08/31/2022   Hypotension 08/31/2022   Right sided weakness    Hx of transient ischemic attack (TIA) 04/28/2022   Obesity    Chronic diastolic CHF (congestive heart failure) (HCC)    Depression    Sepsis (HCC) 03/28/2022   Bacteremia due to Klebsiella pneumoniae 03/28/2022   Hypoglycemia 03/28/2022   Syncope 03/24/2022   Hypokalemia 03/24/2022   Fever 03/24/2022   COPD (chronic obstructive pulmonary disease) (HCC)    Elevated troponin    Abnormal LFTs    History of hepatitis C    AKI (acute kidney injury) (HCC)    Pure hypercholesterolemia    Obesity, Class III, BMI 40-49.9 (morbid obesity) (HCC) 04/18/2020   Thrombocytopenia (HCC) 04/17/2020   Leukopenia 04/17/2020   HTN (hypertension) 04/17/2018   Uncontrolled type 2 diabetes mellitus with hyperglycemia, without long-term current use  of insulin (HCC) 04/17/2018   Lymphedema 04/17/2018   Normocytic anemia 04/07/2018   Chest pain 03/20/2018   Unsheltered homelessness 12/16/2017    Patient Centered Plan: Patient is on the following Treatment Plan(s):  Depression   Referrals to Alternative Service(s): Referred to Alternative Service(s):   Place:   Date:   Time:    Referred to Alternative Service(s):   Place:   Date:   Time:    Referred to Alternative Service(s):   Place:   Date:   Time:    Referred to Alternative Service(s):   Place:   Date:   Time:      @[facility] @  [employer], LCAS-A

## 2023-01-14 NOTE — ED Notes (Signed)
Pt's depends was wet, this RN brought pt to private area and depends was changed and linens changed. Pt given sandwhich tray.

## 2023-01-14 NOTE — ED Provider Notes (Signed)
Patient stated he wanted to go. However given concern for SI with plan as documented in previous notes I did not feel patient should leave prior to psychiatric evaluation. Thus he was placed under IVC.   Phineas Semen, MD 01/14/23 [phone]

## 2023-01-14 NOTE — ED Notes (Signed)
Pt given lunch tray and taken to the restroom.

## 2023-01-14 NOTE — ED Provider Notes (Signed)
[facility] Provider Note    Event Date/Time   First MD Initiated Contact with Patient 01/13/23 2345     (approximate)   History   Chief Complaint Back Pain   HPI  [name] is a 72 y.o. male with past medical history of hypertension, diabetes, diastolic CHF, adrenal insufficiency, and cirrhosis who presents to the ED complaining of back pain.  Patient reports that he has had 2 days of increasing pain in the middle of his lower back radiating down both of his legs.  He denies any fall or other injury to his back.  He has not had any numbness or weakness in his legs, does describe worsening pain with movement.  He has not had any numbness in his groin and denies any bowel or bladder incontinence.  He has not taken anything for his pain at home.  He states that he is having thoughts of suicide due to the severity of pain.  He reports a plan to cut himself due to this.     Physical Exam   Triage Vital Signs: ED Triage Vitals  Enc Vitals Group     BP 01/13/23 1944 (!) 155/77     Pulse Rate 01/13/23 1944 70     Resp 01/13/23 1944 16     Temp 01/13/23 1944 98 F (36.7 C)     Temp Source 01/13/23 1944 Oral     SpO2 01/13/23 1944 100 %     Weight 01/13/23 1945 209 lb 7 oz (95 kg)     Height 01/13/23 1945 5\' 6"  (1.676 m)     Head Circumference --      Peak Flow --      Pain Score 01/13/23 1945 10     Pain Loc --      Pain Edu? --      Excl. in GC? --     Most recent vital signs: Vitals:   01/13/23 1944 01/14/23 0014  BP: (!) 155/77 (!) 145/60  Pulse: 70 (!) 56  Resp: 16 16  Temp: 98 F (36.7 C)   SpO2: 100% 99%    Constitutional: Alert and oriented. Eyes: Conjunctivae are normal. Head: Atraumatic. Nose: No congestion/rhinnorhea. Mouth/Throat: Mucous membranes are moist.  Cardiovascular: Normal rate, regular rhythm. Grossly normal heart sounds.  2+ radial and DP pulses bilaterally. Respiratory: Normal respiratory effort.  No retractions.  Lungs CTAB. Gastrointestinal: Soft and nontender. No distention. Musculoskeletal: No lower extremity tenderness nor edema.  Midline lumbar spinal tenderness to palpation. Neurologic:  Normal speech and language. No gross focal neurologic deficits are appreciated.    ED Results / Procedures / Treatments   Labs (all labs ordered are listed, but only abnormal results are displayed) Labs Reviewed - No data to display   PROCEDURES:  Critical Care performed: No  Procedures   MEDICATIONS ORDERED IN ED: Medications  lidocaine (LIDODERM) 5 % 1 patch (1 patch Transdermal Patch Applied 01/14/23 0030)  ketorolac (TORADOL) 30 MG/ML injection 30 mg (30 mg Intramuscular Given 01/14/23 0030)     IMPRESSION / MDM / ASSESSMENT AND PLAN / ED COURSE  I reviewed the triage vital signs and the nursing notes.                              72 y.o. male with past medical history of hypertension, diabetes, diastolic CHF, adrenal insufficiency, and cirrhosis who presents to the ED complaining of 2  days of pain in his lower back radiating down both legs.  Patient's presentation is most consistent with acute presentation with potential threat to life or bodily function.  Differential diagnosis includes, but is not limited to, lumbar strain, lumbar radiculopathy, cauda equina, malingering.  Patient nontoxic-appearing and in no acute distress, vital signs are unremarkable.  He is neurovascular intact to his bilateral lower extremities with no signs or symptoms concerning for cauda equina.  With no recent trauma, do not feel imaging is indicated at this time.  This appears to be a chronic issue for the patient and we will treat with Toradol and Lidoderm patch.  Patient does report suicidal ideation with plan, desire psychiatric evaluation.  We will consult psychiatry and TTS, patient medically cleared at this time and had labs performed yesterday that were unremarkable.  The patient has been placed in  psychiatric observation due to the need to provide a safe environment for the patient while obtaining psychiatric consultation and evaluation, as well as ongoing medical and medication management to treat the patient's condition.  The patient has not been placed under full IVC at this time.      FINAL CLINICAL IMPRESSION(S) / ED DIAGNOSES   Final diagnoses:  Chronic midline low back pain with bilateral sciatica  Suicidal ideation     Rx / DC Orders   ED Discharge Orders     None        Note:  This document was prepared using Dragon voice recognition software and may include unintentional dictation errors.   Chesley Noon, MD 01/14/23 [phone]

## 2023-01-14 NOTE — ED Notes (Signed)
Pt resting comfortably with eyes closed at this time. Respirations even and unlabored.

## 2023-01-14 NOTE — ED Notes (Signed)
Pt roomed to 1H after being IVC'ed by EDP. Pt dressed in [facility] hospital provided scrubs. Pt calm and cooperative at this time. NAD noted. Pt denies any needs at this time. Pt had a meal tray prior to coming to 1H.

## 2023-01-16 ENCOUNTER — Other Ambulatory Visit: Payer: Self-pay | Admitting: Family

## 2023-01-16 ENCOUNTER — Ambulatory Visit (HOSPITAL_BASED_OUTPATIENT_CLINIC_OR_DEPARTMENT_OTHER): Payer: 59 | Admitting: Family

## 2023-01-16 ENCOUNTER — Emergency Department
Admission: EM | Admit: 2023-01-16 | Discharge: 2023-01-16 | Disposition: A | Discharge status: Home / Self Care | Payer: 59 | Attending: Emergency Medicine | Admitting: Emergency Medicine

## 2023-01-16 ENCOUNTER — Encounter: Payer: Self-pay | Admitting: Family

## 2023-01-16 ENCOUNTER — Ambulatory Visit
Admission: RE | Admit: 2023-01-16 | Discharge: 2023-01-16 | Disposition: A | Discharge status: Home / Self Care | Payer: 59 | Source: Ambulatory Visit | Attending: Family | Admitting: Family

## 2023-01-16 ENCOUNTER — Other Ambulatory Visit: Payer: Self-pay

## 2023-01-16 ENCOUNTER — Telehealth: Payer: Self-pay | Admitting: Licensed Clinical Social Worker

## 2023-01-16 VITALS — BP 148/68 | HR 52 | Wt 238.4 lb

## 2023-01-16 DIAGNOSIS — I110 Hypertensive heart disease with heart failure: Secondary | ICD-10-CM | POA: Insufficient documentation

## 2023-01-16 DIAGNOSIS — Z8619 Personal history of other infectious and parasitic diseases: Secondary | ICD-10-CM | POA: Insufficient documentation

## 2023-01-16 DIAGNOSIS — R001 Bradycardia, unspecified: Secondary | ICD-10-CM | POA: Insufficient documentation

## 2023-01-16 DIAGNOSIS — K7460 Unspecified cirrhosis of liver: Secondary | ICD-10-CM | POA: Insufficient documentation

## 2023-01-16 DIAGNOSIS — I428 Other cardiomyopathies: Secondary | ICD-10-CM | POA: Insufficient documentation

## 2023-01-16 DIAGNOSIS — E119 Type 2 diabetes mellitus without complications: Secondary | ICD-10-CM | POA: Insufficient documentation

## 2023-01-16 DIAGNOSIS — I10 Essential (primary) hypertension: Secondary | ICD-10-CM | POA: Diagnosis not present

## 2023-01-16 DIAGNOSIS — G8929 Other chronic pain: Secondary | ICD-10-CM | POA: Diagnosis not present

## 2023-01-16 DIAGNOSIS — J4489 Other specified chronic obstructive pulmonary disease: Secondary | ICD-10-CM | POA: Insufficient documentation

## 2023-01-16 DIAGNOSIS — Z5900 Homelessness unspecified: Secondary | ICD-10-CM | POA: Insufficient documentation

## 2023-01-16 DIAGNOSIS — I5032 Chronic diastolic (congestive) heart failure: Secondary | ICD-10-CM | POA: Insufficient documentation

## 2023-01-16 DIAGNOSIS — Z7984 Long term (current) use of oral hypoglycemic drugs: Secondary | ICD-10-CM | POA: Insufficient documentation

## 2023-01-16 DIAGNOSIS — Z79899 Other long term (current) drug therapy: Secondary | ICD-10-CM | POA: Insufficient documentation

## 2023-01-16 DIAGNOSIS — I509 Heart failure, unspecified: Secondary | ICD-10-CM | POA: Diagnosis not present

## 2023-01-16 DIAGNOSIS — R109 Unspecified abdominal pain: Secondary | ICD-10-CM | POA: Diagnosis present

## 2023-01-16 LAB — COMPREHENSIVE METABOLIC PANEL
ALT: 40 U/L (ref 0–44)
AST: 81 U/L — ABNORMAL HIGH (ref 15–41)
Albumin: 2.4 g/dL — ABNORMAL LOW (ref 3.5–5.0)
Alkaline Phosphatase: 149 U/L — ABNORMAL HIGH (ref 38–126)
Anion gap: 8 (ref 5–15)
BUN: 16 mg/dL (ref 8–23)
CO2: 23 mmol/L (ref 22–32)
Calcium: 8.2 mg/dL — ABNORMAL LOW (ref 8.9–10.3)
Chloride: 107 mmol/L (ref 98–111)
Creatinine, Ser: 1.12 mg/dL (ref 0.61–1.24)
GFR, Estimated: >60 mL/min (ref >60)
Glucose, Bld: 105 mg/dL — ABNORMAL HIGH (ref 70–99)
Potassium: 3.7 mmol/L (ref 3.5–5.1)
Sodium: 138 mmol/L (ref 135–145)
Total Bilirubin: 1.5 mg/dL — ABNORMAL HIGH (ref 0.3–1.2)
Total Protein: 5.7 g/dL — ABNORMAL LOW (ref 6.5–8.1)

## 2023-01-16 LAB — CBC WITH DIFFERENTIAL/PLATELET
Abs Immature Granulocytes: 0.01 10*3/uL (ref 0.00–0.07)
Basophils Absolute: 0 10*3/uL (ref 0.0–0.1)
Basophils Relative: 2 %
Eosinophils Absolute: 0.1 10*3/uL (ref 0.0–0.5)
Eosinophils Relative: 4 %
HCT: 29.4 % — ABNORMAL LOW (ref 39.0–52.0)
Hemoglobin: 9.8 g/dL — ABNORMAL LOW (ref 13.0–17.0)
Immature Granulocytes: 0 %
Lymphocytes Relative: 30 %
Lymphs Abs: 0.7 10*3/uL (ref 0.7–4.0)
MCH: 30.8 pg (ref 26.0–34.0)
MCHC: 33.3 g/dL (ref 30.0–36.0)
MCV: 92.5 fL (ref 80.0–100.0)
Monocytes Absolute: 0.4 10*3/uL (ref 0.1–1.0)
Monocytes Relative: 16 %
Neutro Abs: 1.1 10*3/uL — ABNORMAL LOW (ref 1.7–7.7)
Neutrophils Relative %: 48 %
Platelets: 90 10*3/uL — ABNORMAL LOW (ref 150–400)
RBC: 3.18 MIL/uL — ABNORMAL LOW (ref 4.22–5.81)
RDW: 15.8 % — ABNORMAL HIGH (ref 11.5–15.5)
WBC: 2.3 10*3/uL — ABNORMAL LOW (ref 4.0–10.5)
nRBC: 0 % (ref 0.0–0.2)

## 2023-01-16 LAB — BASIC METABOLIC PANEL
Anion gap: 5 (ref 5–15)
BUN: 16 mg/dL (ref 8–23)
CO2: 26 mmol/L (ref 22–32)
Calcium: 8.5 mg/dL — ABNORMAL LOW (ref 8.9–10.3)
Chloride: 106 mmol/L (ref 98–111)
Creatinine, Ser: 0.99 mg/dL (ref 0.61–1.24)
GFR, Estimated: >60 mL/min (ref >60)
Glucose, Bld: 100 mg/dL — ABNORMAL HIGH (ref 70–99)
Potassium: 4 mmol/L (ref 3.5–5.1)
Sodium: 137 mmol/L (ref 135–145)

## 2023-01-16 LAB — BRAIN NATRIURETIC PEPTIDE: B Natriuretic Peptide: 530.9 pg/mL — ABNORMAL HIGH (ref 0.0–100.0)

## 2023-01-16 LAB — LIPASE, BLOOD: Lipase: 43 U/L (ref 11–51)

## 2023-01-16 MED ORDER — POTASSIUM CHLORIDE CRYS ER 20 MEQ PO TBCR
EXTENDED_RELEASE_TABLET | ORAL | Status: AC
  Filled 2023-01-16: qty 2

## 2023-01-16 MED ORDER — PROMETHAZINE HCL 25 MG PO TABS
12.5000 mg | ORAL_TABLET | Freq: Once | ORAL | Status: AC
  Administered 2023-01-16: 12.5 mg via ORAL

## 2023-01-16 MED ORDER — PROMETHAZINE HCL 12.5 MG PO TABS: 25.0000 mg | ORAL_TABLET | Freq: Four times a day (QID) | ORAL | 0 refills | Status: DC | PRN

## 2023-01-16 MED ORDER — POTASSIUM CHLORIDE CRYS ER 20 MEQ PO TBCR
40.0000 meq | EXTENDED_RELEASE_TABLET | Freq: Once | ORAL | Status: AC
  Administered 2023-01-16: 40 meq via ORAL

## 2023-01-16 MED ORDER — POTASSIUM CHLORIDE CRYS ER 20 MEQ PO TBCR: 20.0000 meq | EXTENDED_RELEASE_TABLET | Freq: Every day | ORAL | 3 refills | Status: DC

## 2023-01-16 MED ORDER — PROMETHAZINE HCL 25 MG PO TABS
ORAL_TABLET | ORAL | Status: AC
  Filled 2023-01-16: qty 1

## 2023-01-16 MED ORDER — FUROSEMIDE 10 MG/ML IJ SOLN
80.0000 mg | Freq: Once | INTRAMUSCULAR | Status: AC
  Administered 2023-01-16: 80 mg via INTRAVENOUS

## 2023-01-16 MED ORDER — FUROSEMIDE 10 MG/ML IJ SOLN
INTRAMUSCULAR | Status: AC
  Filled 2023-01-16: qty 8

## 2023-01-16 MED ORDER — NITROGLYCERIN 0.4 MG/SPRAY TL SOLN: 1.0000 | 5 refills | Status: DC | PRN

## 2023-01-16 NOTE — Discharge Instructions (Addendum)
Please see [name] in the morning as scheduled.

## 2023-01-16 NOTE — Progress Notes (Signed)
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  Echo 12/24/22: EF 60-65% along with mild LVH, mild LAE. Echo 04/01/21: EF of 55-60% along with mild LVH/ LAE. Echo 03/20/18: EF of 60-65% along with mild/moderate Drew and a mildly elevated PA pressure of 42 mm Hg  Has been in the ED 4 times this month (May) due to various complaints. Most recent was 01/13/23 due to chronic LBP and suicidal thoughts.   He presents today for a HF follow-up visit with a chief complaint of moderate SOB with minimal exertion. Chronic in nature although he reports that he feels like his SOB is worsening. Has associated fatigue, intermittent chest pain, dizziness, frequent falls, severe lower back pain and worsening edema in legs/ abdomen.   Says that he continues to have issues with homelessness and food insecurity. Does use public transportation and reports being able to afford all his medications. Has occasionally lost his meds or had them stolen depending on his living situation.   Has had numerous ED visits this month and has reports some suicidal thoughts because of his current situation. Psychiatry evaluated him at his recent ED visit 3 days ago. Reports frustration that no one will help him with his back pain and but understands that any treatment of this will need to come from his PCP.   ROS: All systems negative except as listed in HPI, PMH and Problem List.  SH:  Social History   Socioeconomic History   Marital status: Single    Spouse name: Not on file   Number of children: 2   Years of education: 12   Highest education level: 12th grade  Occupational History   Occupation: retired  Tobacco Use   Smoking status: Former   Smokeless  tobacco: Never  Building services engineer Use: Not on file  Substance and Sexual Activity   Alcohol use: Never   Drug use: Never   Sexual activity: Never    Birth control/protection: None  Other Topics Concern   Not on file  Social History Narrative   Not on file   Social Determinants of Health   Financial Resource Strain: High Risk (04/27/2022)   Overall Financial Resource Strain (CARDIA)    Difficulty of Paying Living Expenses: Very hard  Food Insecurity: Food Insecurity Present (12/24/2022)   Hunger Vital Sign    Worried About Running Out of Food in the Last Year: Sometimes true    Ran Out of Food in the Last Year: Sometimes true  Transportation Needs: Unmet Transportation Needs (12/24/2022)   PRAPARE - Transportation    Lack of Transportation (Medical): Yes    Lack of Transportation (Non-Medical): Yes  Physical Activity: Sufficiently Active (04/16/2018)   Exercise Vital Sign    Days of Exercise per Week: 7 days    Minutes of Exercise per Session: 30 min  Stress: Stress Concern Present (04/16/2018)   [employer] of Occupational Health - Occupational Stress Questionnaire    Feeling of Stress : Very much  Social Connections: Moderately Isolated (04/30/2018)   Social Connection and Isolation Panel [NHANES]    Frequency of Communication with Friends and Family: Twice a week    Frequency of Social Gatherings with Friends and Family: Never  Attends Religious Services: More than 4 times per year    Active Member of Clubs or Organizations: No    Attends Banker Meetings: Never    Marital Status: Widowed  Intimate Partner Violence: Not At Risk (12/24/2022)   [name], Afraid, [name], and Kick questionnaire    Fear of Current or Ex-Partner: No    Emotionally Abused: No    Physically Abused: No    Sexually Abused: No    FH:  Family History  Problem Relation Age of Onset   Hypertension Mother    Heart disease Mother    Heart failure Maternal Grandmother     Past  Medical History:  Diagnosis Date   Asthma    CHF (congestive heart failure) (HCC)    COPD (chronic obstructive pulmonary disease) (HCC)    COVID-19 03/2020   diagnosed in August 2021   Diabetes mellitus without complication (HCC)    Homelessness    Hypertension    Infestation by bed bug    Migraine    Obesity    Sleep apnea     Current Outpatient Medications  Medication Sig Dispense Refill   albuterol (VENTOLIN HFA) 108 (90 Base) MCG/ACT inhaler Inhale 2 puffs into the lungs every 6 (six) hours as needed for wheezing or shortness of breath. 1 each 5   ascorbic acid (VITAMIN C) 500 MG tablet Take 1 tablet (500 mg total) by mouth daily.     aspirin EC 81 MG tablet Take 1 tablet (81 mg total) by mouth daily. Swallow whole. 30 tablet 5   atorvastatin (LIPITOR) 40 MG tablet Take 1 tablet (40 mg total) by mouth daily. 30 tablet 5   atorvastatin (LIPITOR) 40 MG tablet Take 1 tablet (40 mg total) by mouth daily. 30 tablet 1   citalopram (CELEXA) 20 MG tablet Take 1 tablet (20 mg total) by mouth daily. 30 tablet 5   citalopram (CELEXA) 20 MG tablet Take 1 tablet (20 mg total) by mouth daily. 30 tablet 1   doxepin (SINEQUAN) 50 MG capsule Take 1 capsule (50 mg total) by mouth at bedtime. 30 capsule 5   doxepin (SINEQUAN) 50 MG capsule Take 1 capsule (50 mg total) by mouth at bedtime. 30 capsule 1   empagliflozin (JARDIANCE) 10 MG TABS tablet Take 1 tablet (10 mg total) by mouth daily before breakfast. 30 tablet 1   furosemide (LASIX) 40 MG tablet Take 1 tablet (40 mg total) by mouth daily. 30 tablet 1   glucosamine-chondroitin 500-400 MG tablet Take 1 tablet by mouth 3 (three) times daily.     hydrocortisone (CORTEF) 10 MG tablet Take 1 tablet (10 mg total) by mouth daily. 30 tablet 1   hydrocortisone (CORTEF) 10 MG tablet Take 1 tablet (10 mg total) by mouth daily. 30 tablet 1   hydrocortisone (CORTEF) 5 MG tablet Take 1 tablet (5 mg total) by mouth 2 (two) times daily. 60 tablet 1    JARDIANCE 10 MG TABS tablet Take 1 tablet (10 mg total) by mouth daily. 30 tablet 5   lactulose (CHRONULAC) 10 GM/15ML solution Take 30 mLs (20 g total) by mouth 2 (two) times daily. 236 mL 0   Lactulose 20 GM/30ML SOLN Take 30 mLs (20 g total) by mouth in the morning and at bedtime. 450 mL 2   losartan (COZAAR) 50 MG tablet Take 1 tablet (50 mg total) by mouth daily after breakfast. 30 tablet 5   losartan (COZAAR) 50 MG tablet Take 1 tablet (50 mg  total) by mouth daily. 30 tablet 1   meclizine (ANTIVERT) 25 MG tablet Take 0.5 tablets (12.5 mg total) by mouth 2 (two) times daily as needed for up to 12 doses for dizziness. 6 tablet 0   metFORMIN (GLUCOPHAGE) 500 MG tablet Take 500 mg by mouth daily.     Multiple Vitamin (MULTIVITAMIN WITH MINERALS) TABS tablet Take 1 tablet by mouth daily.     nitroGLYCERIN (NITROSTAT) 0.4 MG SL tablet Place 1 tablet (0.4 mg total) under the tongue every 5 (five) minutes x 3 doses as needed for chest pain. 30 tablet 5   oxyCODONE (ROXICODONE) 5 MG immediate release tablet Take 1 tablet (5 mg total) by mouth every 8 (eight) hours as needed for up to 6 doses. 4 tablet 0   pantoprazole (PROTONIX) 20 MG tablet Take 1 tablet (20 mg total) by mouth daily. 30 tablet 5   pantoprazole (PROTONIX) 40 MG tablet Take 1 tablet (40 mg total) by mouth daily. 30 tablet 1   QUEtiapine (SEROQUEL) 50 MG tablet Take 1 tablet (50 mg total) by mouth at bedtime. 30 tablet 5   QUEtiapine (SEROQUEL) 50 MG tablet Take 1 tablet (50 mg total) by mouth at bedtime. 30 tablet 1   rifaximin (XIFAXAN) 550 MG TABS tablet Take 1 tablet (550 mg total) by mouth 2 (two) times daily. 60 tablet 1   No current facility-administered medications for this visit.    Vitals:   01/16/23 0849  BP: (!) 148/68  Pulse: (!) 52  SpO2: 99%  Weight: 238 lb 6.4 oz (108.1 kg)   Wt Readings from Last 3 Encounters:  01/16/23 238 lb 6.4 oz (108.1 kg)  01/13/23 209 lb 7 oz (95 kg)  01/12/23 209 lb 7 oz (95 kg)    Lab Results  Component Value Date   CREATININE 1.08 01/12/2023   CREATININE 0.89 01/08/2023   CREATININE 1.04 12/26/2022   PHYSICAL EXAM:  General:  Well appearing. No resp difficulty HEENT: normal Neck: supple. JVP flat. No lymphadenopathy or thryomegaly appreciated. Cor: PMI normal. Regular rhythm. Bradycardic. No rubs, gallops or murmurs. Lungs: clear Abdomen: soft, nontender, nondistended. No hepatosplenomegaly. No bruits or masses.  Extremities: no cyanosis, clubbing, rash. 2+ pitting edema bilateral lower legs Neuro: alert & oriented x3, cranial nerves grossly intact. Moves all 4 extremities w/o difficulty. Affect pleasant.  ReDs: 42%  ECG: today is sinus bradycardia   ASSESSMENT & PLAN:  1: NICM with preserved ejection fraction with structural changes (LVH/LAE)- - likely HTN - NYHA class III - fluid overloaded today with worsening symptoms and elevated ReDs reading - weight down 15.6 pounds from last visit here 8 months ago  - Echo 12/24/22: EF 60-65% along with mild LVH, mild LAE - Echo 03/25/22: EF 60-65% - Echo 04/01/21: EF of 55-60% along with mild LVH/ LAE.  - Echo 03/20/18: EF of 60-65% along with mild/moderate Drew and a mildly elevated PA pressure of 42 mm Hg - ReDs clip reading today was 42% - will send for 80mg  IV lasix/ PO potassium - BMP/ BNP today  - not adding salt to his food but admits finances are tight & he can't always eat low sodium foods; does report food insecurities and would like to speak w/ social worker regarding this; referral placed - continue jardiance 10mg  daily - continue losartan 50mg  daily; consider entresto although he says that he prefers daily dosing of meds - continue furosemide 40mg  daily; add potassium daily - consider adding spironolactone although consistent  f/u could be an issue - BNP 01/08/23 was 434.0 - PharmD reconciled meds w/ patient  2: HTN- - BP 148/68 - sees PCP at [facility] within the  next 2 weeks - BMP 01/12/23 reviewed and showed sodium 138, potassium 3.2, creatinine 1.08 and GFR >60  3: DM- - A1c 12/24/22 was 5.1% - continue metformin 500mg  daily - glucose in the ED last night was 131  4: Homelessness- - currently can't get his wallet, ID card etc because they are in a friend's storage bldg and this friend owes money on the bldg - does get around using public bus transportation - has charges pending so can not go to homeless shelter nor leave the county because of probation  5: Bradycardia- - EKG today shows sinus bradycardia - had telemedicine visit with cardiology Mariah Milling) 02/22  6: Liver cirrhosis- - likely secondary hepatitis C w/ positive viral load in 2019 - acute viral panel approximately 02/24 also demonstrated positive for hepatitis B antigen.  - needs to get established with GI  Return tomorrow for f/u from IV lasix.

## 2023-01-16 NOTE — Telephone Encounter (Signed)
H&V Care Navigation CSW Progress Note  Clinical Social Worker contacted patient by phone to f/u on referral from  HF team. Pt previously assessed by teammate Jenna, LCSW, in 2023. Pt still currently homeless, shared with HF team that he has challenges ongoing with food. LCSW has left a message for pt at [phone] requesting he return my call as able.  Patient is participating in a Managed Medicaid Plan:  No, [facility] Medicare  SDOH Screenings   Food Insecurity: Food Insecurity Present (12/24/2022)  Housing: High Risk (12/24/2022)  Transportation Needs: Unmet Transportation Needs (12/24/2022)  Utilities: Not At Risk (12/24/2022)  Alcohol Screen: Low Risk  (09/24/2022)  Depression (PHQ2-9): High Risk (04/26/2022)  Financial Resource Strain: High Risk (04/27/2022)  Physical Activity: Sufficiently Active (04/16/2018)  Social Connections: Moderately Isolated (04/30/2018)  Stress: Stress Concern Present (04/16/2018)  Tobacco Use: Medium Risk (01/16/2023)   Octavio Graves, MSW, LCSW Clinical Social Worker II [facility] Heart/Vascular Care Navigation  [phone]- work cell phone (preferred) [phone]- desk phone

## 2023-01-16 NOTE — ED Provider Notes (Signed)
[facility] Provider Note    Event Date/Time   First MD Initiated Contact with Patient 01/16/23 2155     (approximate)   History   Abdominal Pain   HPI [name] is a 72 y.o. male well-known to the emergency department with 18 visits in the last 6 months.  He also is followed by cardiology, specifically Clarisa Kindred with the CHF clinic.  Besides extensive chronic medical history he has complicated social determinants of health including homelessness that make his care difficult.  He presents tonight by private vehicle for evaluation of ongoing abdominal pain and "pain everywhere".  He was seen by Ms. Hackney in the CHF clinic earlier today and received a dose of Lasix.  He said that the left side of his abdomen feels distended and that it has been like that for the last 2 months since he had his appendectomy.  He said it is not getting any better and it is keeping him from being able to sleep.  He claims that he was lying around and someone saw him that felt bad for him so they brought him by private vehicle to the emergency department.  He is not having any acute shortness of breath nor chest pain, just that he has pain everywhere.  No recent trauma.     Physical Exam   Triage Vital Signs: ED Triage Vitals [01/16/23 2027]  Enc Vitals Group     BP (!) 123/59     Pulse Rate (!) 56     Resp 18     Temp 98.9 F (37.2 C)     Temp Source Oral     SpO2 97 %     Weight 108 kg (238 lb)     Height 1.676 m (5\' 6" )     Head Circumference      Peak Flow      Pain Score 8     Pain Loc      Pain Edu?      Excl. in GC?     Most recent vital signs: Vitals:   01/16/23 2027  BP: (!) 123/59  Pulse: (!) 56  Resp: 18  Temp: 98.9 F (37.2 C)  SpO2: 97%    General: Awake, no distress.  Disheveled but at baseline. CV:  Good peripheral perfusion.  Regular rate and rhythm, borderline bradycardia. Resp:  Normal effort. Speaking easily and comfortably, no  accessory muscle usage nor intercostal retractions.   Abd:  Some distention consistent with possible volume overload, mild anasarca.  Mild generalized tenderness to palpation throughout, no guarding, no rebound.  Patient does not react when distracted during the exam.   ED Results / Procedures / Treatments   Labs (all labs ordered are listed, but only abnormal results are displayed) Labs Reviewed  COMPREHENSIVE METABOLIC PANEL - Abnormal; Notable for the following components:      Result Value   Glucose, Bld 105 (*)    Calcium 8.2 (*)    Total Protein 5.7 (*)    Albumin 2.4 (*)    AST 81 (*)    Alkaline Phosphatase 149 (*)    Total Bilirubin 1.5 (*)    All other components within normal limits  CBC WITH DIFFERENTIAL/PLATELET - Abnormal; Notable for the following components:   WBC 2.3 (*)    RBC 3.18 (*)    Hemoglobin 9.8 (*)    HCT 29.4 (*)    RDW 15.8 (*)    Platelets 90 (*)  Neutro Abs 1.1 (*)    All other components within normal limits  LIPASE, BLOOD     EKG  I viewed and interpreted the EKG obtained earlier today at the CHF clinic.  It was notable for bradycardia but no acute ischemia or other concerning abnormalities, consistent with his prior.   PROCEDURES:  Critical Care performed: No  Procedures    IMPRESSION / MDM / ASSESSMENT AND PLAN / ED COURSE  I reviewed the triage vital signs and the nursing notes.                              Differential diagnosis includes, but is not limited to, malingering, chronic pain, CHF exacerbation, intra-abdominal hematoma or abscess, anasarca.  Patient's presentation is most consistent with exacerbation of chronic illness.  Labs/studies ordered: Lipase, CBC with differential, CMP  Interventions/Medications given:  Medications - No data to display  (Note:  hospital course my include additional interventions and/or labs/studies not listed above.)   The patient is well-known to me and to the emergency  department in general.  All of his labs are consistent with his prior values; he is chronically leukopenic, anemic, and thrombocytopenic, and his CBC with differential is consistent.  No substantial changes on his CMP.  I reviewed his medical record including his recent CT scans, including a CT of the abdomen and pelvis from about 1 week ago when he was here for the same concern of abdominal pain and swelling.  There is no evidence that he is suffering from any postoperative complications from his appendectomy 2 months ago.  I believe there is a strong component of secondary gain and malingering, since the patient often stays on campus and even in the waiting room overnight since he lacks any other place to stay.  It is likely he is feeling some discomfort from his chronic medical conditions including his mild volume overload.  He was seen in CHF clinic today and I reviewed the note from [facility], and I confirmed that the patient received extra Lasix today.  Additionally, he informed me that he has an appointment in the morning as a follow-up.  I provided reassurance and explained that the patient cannot stay indefinitely in the emergency department, including in the waiting room, even as result of his social situation.  He has been observed by multiple staff members walking without any apparent difficulty.  He is being discharged to leave the property and to return for his appointment in the morning.       FINAL CLINICAL IMPRESSION(S) / ED DIAGNOSES   Final diagnoses:  Other chronic pain  Chronic congestive heart failure, unspecified heart failure type (HCC)     Rx / DC Orders   ED Discharge Orders     None        Note:  This document was prepared using Dragon voice recognition software and may include unintentional dictation errors.   Loleta Rose, MD 01/16/23 2209

## 2023-01-16 NOTE — ED Notes (Signed)
Dr York Cerise to triage to evaluation pt & d/c; pt upset over d/c; pt refuses d/c instructions; pt yelling st "if ya'll touch me I'm suing!!"; security called to triage to escort pt out of lobby

## 2023-01-16 NOTE — Patient Instructions (Signed)
I"m sending in potassium and you will take 1 take a day.

## 2023-01-16 NOTE — ED Provider Triage Note (Signed)
Emergency Medicine Provider Triage Evaluation Note  [name] , a 72 y.o. male  was evaluated in triage.  Pt complains of abd pain, feels bloated. He feels there is an area of swelling to his left abdomen. No CP/SOB. He reports he has had pain since his emergency appendectomy in the 1980s  Review of Systems  Positive: Abd pain Negative: Cp/sob  Physical Exam  There were no vitals taken for this visit. Gen:   Awake, no distress   Resp:  Normal effort  MSK:   Moves extremities without difficulty  Other:    Medical Decision Making  Medically screening exam initiated at 8:25 PM.  Appropriate orders placed.  CONNOLLY TREZISE was informed that the remainder of the evaluation will be completed by another provider, this initial triage assessment does not replace that evaluation, and the importance of remaining in the ED until their evaluation is complete.     Jackelyn Hoehn, PA-C 01/16/23 2027

## 2023-01-16 NOTE — Progress Notes (Signed)
   01/16/23 0941  ReDS Vest / Clip  Station Marker D  Ruler Value 34  ReDS Value Range (!) > 40  ReDS Actual Value 42

## 2023-01-16 NOTE — Progress Notes (Signed)
[facility] - Pharmacist Note  [name] is a 72 y.o. male with HFpEF (EF >50%) presenting to the Heart Failure Clinic for follow up. Patient presents today with multiple concerns. He reports that he recently lost his home and all of his medications. He was re-prescribed his medications by EDP at recent visit. He reports that he is also unable to take his weight at home as he lost his scale when he lost his home. Patient does not report whether he has found a new living situation. He reports shortness of breath and leg swelling today. He reports drinking fluids "when he's thirsty" and estimates that he drinks one 16 oz bottle of water or soda daily. He expresses concerns about his liver and has many questions about future visits with various providers. Dates of future visits visible in Epic were provided to the patient.  Recent ED Visit (past 6 months):  Date: 01/14/2023, CC: back pain Date: 01/12/2023, CC: chest pain Date: 01/09/2023, CC: dizziness Date: 01/08/2023, CC: abdominal pain  Date: 12/23/2022, CC: chest pain  Date: 11/18/2022, CC: abdominal pain Date: 11/15/2022, CC: abdominal pain  Date: 11/10/2022, CC: abdominal pain Date: 11/05/2022, CC: emesis Date: 11/01/2022, CC: diarrhea Date: 10/21/2022, CC: headache Date: 10/17/2022, CC: fall Date: 09/15/2022, CC: psychiatric evaluation Date: 09/14/2022, CC: SOB Date: 08/30/2022, CC: weakness Date: 08/12/2022, CC: fall Date: 08/09/2022, CC: nausea   Guideline-Directed Medical Therapy/Evidence Based Medicine ACE/ARB/ARNI: Losartan 50 mg daily Beta Blocker:  none Aldosterone Antagonist:  none Diuretic: Furosemide 40 mg daily SGLT2i: Empagliflozin 10 mg daily  Adherence Assessment Do you ever forget to take your medication? [] Yes [x] No  Do you ever skip doses due to side effects? [] Yes [x] No  Do you have trouble affording your medicines? [] Yes [x] No  Are you ever unable to pick up your medication due to transportation  difficulties? [] Yes [x] No  Do you ever stop taking your medications because you don't believe they are helping? [] Yes [x] No  Do you check your weight daily? [] Yes [x] No  Adherence strategy: pillbox Barriers to obtaining medications: none repoted  Diagnostics ECHO: Date 12/24/2022, EF 60-65%, no RWMA, mild LVH  Vitals    01/14/2023    3:33 PM 01/14/2023    9:07 AM 01/14/2023   12:14 AM  Vitals with BMI  Systolic 154 128 562  Diastolic 64 58 60  Pulse 57 60 56     Recent Labs    Latest Ref Rng & Units 01/12/2023    7:57 PM 01/08/2023    8:44 PM 12/26/2022    5:03 AM  BMP  Glucose 70 - 99 mg/dL 130  80  92   BUN 8 - 23 mg/dL 17  14  25    Creatinine 0.61 - 1.24 mg/dL 8.65  7.84  6.96   Sodium 135 - 145 mmol/L 138  140  138   Potassium 3.5 - 5.1 mmol/L 3.2  3.7  3.8   Chloride 98 - 111 mmol/L 106  110  110   CO2 22 - 32 mmol/L 27  23  26    Calcium 8.9 - 10.3 mg/dL 8.9  8.4  8.0     Past Medical History Past Medical History:  Diagnosis Date   Asthma    CHF (congestive heart failure) (HCC)    COPD (chronic obstructive pulmonary disease) (HCC)    COVID-19 03/2020   diagnosed in August 2021   Diabetes mellitus without complication (HCC)    Homelessness    Hypertension    Infestation  by bed bug    Migraine    Obesity    Sleep apnea     Plan Continue regimen per NP ReDS today 42 Give lasix 80 mg IV x1 today with SDS Annual echo due 11/2023  Time spent: 20 minutes  Celene Squibb, PharmD PGY1 Pharmacy Resident 01/16/2023 8:53 AM

## 2023-01-16 NOTE — ED Triage Notes (Signed)
Pt comes from [facility] for abd pain. States that was seen earlier today and given 80 mg lasix and sent home. Reports acute onset of L sided abd pain and swelling. Pt has recurrent hx of same. Alert and oriented on arrival. Ambulatory. Breathing unlabored speaking in full sentences. Symmetric chest rise and fall.

## 2023-01-17 ENCOUNTER — Telehealth: Payer: Self-pay | Admitting: Licensed Clinical Social Worker

## 2023-01-17 ENCOUNTER — Other Ambulatory Visit: Payer: Self-pay

## 2023-01-17 ENCOUNTER — Ambulatory Visit (HOSPITAL_BASED_OUTPATIENT_CLINIC_OR_DEPARTMENT_OTHER): Payer: 59 | Admitting: Cardiology

## 2023-01-17 ENCOUNTER — Other Ambulatory Visit
Admission: RE | Admit: 2023-01-17 | Discharge: 2023-01-17 | Disposition: A | Discharge status: Home / Self Care | Payer: 59 | Source: Ambulatory Visit | Attending: Cardiology | Admitting: Cardiology

## 2023-01-17 ENCOUNTER — Encounter: Payer: Self-pay | Admitting: Cardiology

## 2023-01-17 VITALS — BP 137/58 | HR 51 | Wt 227.8 lb

## 2023-01-17 DIAGNOSIS — E119 Type 2 diabetes mellitus without complications: Secondary | ICD-10-CM | POA: Diagnosis not present

## 2023-01-17 DIAGNOSIS — I5032 Chronic diastolic (congestive) heart failure: Secondary | ICD-10-CM | POA: Diagnosis present

## 2023-01-17 DIAGNOSIS — I10 Essential (primary) hypertension: Secondary | ICD-10-CM | POA: Diagnosis not present

## 2023-01-17 DIAGNOSIS — Z7984 Long term (current) use of oral hypoglycemic drugs: Secondary | ICD-10-CM

## 2023-01-17 DIAGNOSIS — R0781 Pleurodynia: Secondary | ICD-10-CM

## 2023-01-17 DIAGNOSIS — Z5900 Homelessness unspecified: Secondary | ICD-10-CM | POA: Diagnosis not present

## 2023-01-17 LAB — COMPREHENSIVE METABOLIC PANEL
ALT: 38 U/L (ref 0–44)
AST: 65 U/L — ABNORMAL HIGH (ref 15–41)
Albumin: 2.4 g/dL — ABNORMAL LOW (ref 3.5–5.0)
Alkaline Phosphatase: 156 U/L — ABNORMAL HIGH (ref 38–126)
Anion gap: 6 (ref 5–15)
BUN: 16 mg/dL (ref 8–23)
CO2: 25 mmol/L (ref 22–32)
Calcium: 8.5 mg/dL — ABNORMAL LOW (ref 8.9–10.3)
Chloride: 109 mmol/L (ref 98–111)
Creatinine, Ser: 1.13 mg/dL (ref 0.61–1.24)
GFR, Estimated: >60 mL/min (ref >60)
Glucose, Bld: 66 mg/dL — ABNORMAL LOW (ref 70–99)
Potassium: 4 mmol/L (ref 3.5–5.1)
Sodium: 140 mmol/L (ref 135–145)
Total Bilirubin: 1.9 mg/dL — ABNORMAL HIGH (ref 0.3–1.2)
Total Protein: 6.2 g/dL — ABNORMAL LOW (ref 6.5–8.1)

## 2023-01-17 LAB — BRAIN NATRIURETIC PEPTIDE: B Natriuretic Peptide: 537.9 pg/mL — ABNORMAL HIGH (ref 0.0–100.0)

## 2023-01-17 MED ORDER — SPIRONOLACTONE 25 MG PO TABS: 25.0000 mg | ORAL_TABLET | Freq: Every day | ORAL | 3 refills | Status: DC

## 2023-01-17 MED ORDER — LOSARTAN POTASSIUM 50 MG PO TABS: 50.0000 mg | ORAL_TABLET | Freq: Every day | ORAL | 6 refills | Status: DC

## 2023-01-17 MED ORDER — FUROSEMIDE 40 MG PO TABS: ORAL_TABLET | ORAL | 3 refills | Status: DC

## 2023-01-17 NOTE — Telephone Encounter (Signed)
H&V Care Navigation CSW Progress Note  Clinical Social Worker contacted patient by phone to f/u on referral for SDOH needs. Was able to reach him this morning at [phone]. Unable to offer any resources/complete new assessment during the call as pt perseverates on how he "can't utilize any resources in the community" due to previous hx of criminal charges, how "he served in [employer] and they don't do anything for him", how he can't leave the county and [employer] doesn't have resources for homeless individuals- and how he is going to sue the hospital system for not providing adequate care.   During course of call I was able to remind pt of appt this morning, he states it is only for labs and he will be 2mins-1hour late. I shared there may be resources to get him to appt, but he states he has to get himself cleaned up and get a clean pair of pants and that "I am not even sure if they are going to let me go to appt, because I'm banned from going to hospital." I do not see any notes that pt not allowed to come to appt, encouraged him to come as he can. Pt then hung up on this Clinical research associate. Our team remains available to provide resources as able, unfortunately due to his complicated situation it prevents utilization of many resources that we are able to offer.   Patient is participating in a Managed Medicaid Plan:  No, UHC Medicare and Medicaid  SDOH Screenings   Food Insecurity: Food Insecurity Present (12/24/2022)  Housing: High Risk (12/24/2022)  Transportation Needs: Unmet Transportation Needs (12/24/2022)  Utilities: Not At Risk (12/24/2022)  Alcohol Screen: Low Risk  (09/24/2022)  Depression (PHQ2-9): High Risk (04/26/2022)  Financial Resource Strain: High Risk (04/27/2022)  Physical Activity: Sufficiently Active (04/16/2018)  Social Connections: Moderately Isolated (04/30/2018)  Stress: Stress Concern Present (04/16/2018)  Tobacco Use: Medium Risk (01/16/2023)   Octavio Graves, MSW, LCSW Clinical  Social Worker II [facility] Heart/Vascular Care Navigation  [phone]- work cell phone (preferred) [phone]- desk phone

## 2023-01-17 NOTE — Progress Notes (Signed)
   01/17/23 1036  ReDS Vest / Clip  BMI (Calculated) 36.79  Station Marker D  Ruler Value 34  ReDS Value Range 36 - 40  ReDS Actual Value 39

## 2023-01-17 NOTE — Patient Instructions (Signed)
Medication Changes:  Increase Lasix to 60 mg (1.5 tablet) 2 times a day for 5 days. Then  take 40 mg (1 tablet) daily.  Increase Losartan to 100 mg (2 tablets) daily.   Start Spironolactone 25 mg (1 tablet) daily.  Lab Work:  Labs done today, your results will be available in MyChart, we will contact you for abnormal readings.  Referrals:  You have been referred to pain management clinic. Their office will be giving you a call to schedule your appointment.   Special Instructions // Education:  Do the following things EVERYDAY: Weigh yourself in the morning before breakfast. Write it down and keep it in a log. Take your medicines as prescribed Eat low salt foods--Limit salt (sodium) to 2000 mg per day.  Stay as active as you can everyday Limit all fluids for the day to less than 2 liters   Follow-Up in: one week follow with Foster Simpson.    If you have any questions or concerns before your next appointment please send [place] a message through [place] or call our office at [phone] Monday-Friday 8 am-5 pm.   If you have an urgent need after hours on the weekend please call your Primary Cardiologist or the Advanced Heart Failure Clinic in [place] at [phone].

## 2023-01-17 NOTE — Progress Notes (Addendum)
ADVANCED HEART FAILURE CLINIC NOTE  Referring Physician: [name], MD  Primary Care: [name], MD   HPI: [name] is a 72 y.o. male with hypertension, diabetes, COPD, hepatitis C with underlying HCC, migraines, TIA, chronic low back pain, obstructive sleep apnea and heart failure with preserved ejection fraction presenting today as an acute visit after receiving IV Lasix yesterday for volume overload.  As per chart review he has been to the emergency department 4 times in the month of May and most recently on 01/13/2023 due to chronic low back pain and suicidal ideation.  Yesterday he saw [facility] for shortness of breath with minimal exertion.  At that time he was volume overloaded with a weight of 238.,  He received IV Lasix 80 mg and 40 mill equivalents of potassium with a 10 pound weight loss over the past 24 hours.  Today, his weight is 227lbs, however, he still has 2-3+ pitting edema to the knees and becomes quickly short of breath. His complaints today revolve around difficulties being homeless and severe/crippling low back pain.    Past Medical History:  Diagnosis Date   Asthma    CHF (congestive heart failure) (HCC)    COPD (chronic obstructive pulmonary disease) (HCC)    COVID-19 03/2020   diagnosed in August 2021   Diabetes mellitus without complication (HCC)    Homelessness    Hypertension    Infestation by bed bug    Migraine    Obesity    Sleep apnea     Current Outpatient Medications  Medication Sig Dispense Refill   ascorbic acid (VITAMIN C) 500 MG tablet Take 1 tablet (500 mg total) by mouth daily.     aspirin EC 81 MG tablet Take 1 tablet (81 mg total) by mouth daily. Swallow whole. 30 tablet 5   atorvastatin (LIPITOR) 40 MG tablet Take 1 tablet (40 mg total) by mouth daily. 30 tablet 1   citalopram (CELEXA) 20 MG tablet Take 1 tablet (20 mg total) by mouth daily. 30 tablet 1   doxepin (SINEQUAN) 50 MG capsule Take 1 capsule (50 mg total) by mouth  at bedtime. 30 capsule 1   empagliflozin (JARDIANCE) 10 MG TABS tablet Take 1 tablet (10 mg total) by mouth daily before breakfast. 30 tablet 1   furosemide (LASIX) 40 MG tablet Take 1 tablet (40 mg total) by mouth daily. 30 tablet 1   nitroGLYCERIN (NITROLINGUAL) 0.4 MG/SPRAY spray Place 1 spray under the tongue every 5 (five) minutes x 3 doses as needed for chest pain. 12 g 5   pantoprazole (PROTONIX) 40 MG tablet Take 1 tablet (40 mg total) by mouth daily. 30 tablet 1   potassium chloride SA (KLOR-CON M) 20 MEQ tablet Take 1 tablet (20 mEq total) by mouth daily. 90 tablet 3   promethazine (PHENERGAN) 12.5 MG tablet Take 2 tablets (25 mg total) by mouth every 6 (six) hours as needed for nausea or vomiting. 2 tablet 0   QUEtiapine (SEROQUEL) 50 MG tablet Take 1 tablet (50 mg total) by mouth at bedtime. 30 tablet 1   glucosamine-chondroitin 500-400 MG tablet Take 1 tablet by mouth 3 (three) times daily. (Patient not taking: Reported on 01/16/2023)     hydrocortisone (CORTEF) 10 MG tablet Take 1 tablet (10 mg total) by mouth daily. (Patient not taking: Reported on 01/16/2023) 30 tablet 1   losartan (COZAAR) 50 MG tablet Take 1 tablet (50 mg total) by mouth daily. 30 tablet 1  meclizine (ANTIVERT) 25 MG tablet Take 0.5 tablets (12.5 mg total) by mouth 2 (two) times daily as needed for up to 12 doses for dizziness. (Patient not taking: Reported on 01/16/2023) 6 tablet 0   metFORMIN (GLUCOPHAGE) 500 MG tablet Take 500 mg by mouth daily. (Patient not taking: Reported on 01/16/2023)     Multiple Vitamin (MULTIVITAMIN WITH MINERALS) TABS tablet Take 1 tablet by mouth daily. (Patient not taking: Reported on 01/16/2023)     nitroGLYCERIN (NITROSTAT) 0.4 MG SL tablet Place 1 tablet (0.4 mg total) under the tongue every 5 (five) minutes x 3 doses as needed for chest pain. (Patient not taking: Reported on 01/16/2023) 30 tablet 5   oxyCODONE (ROXICODONE) 5 MG immediate release tablet Take 1 tablet (5 mg total) by mouth  every 8 (eight) hours as needed for up to 6 doses. (Patient not taking: Reported on 01/16/2023) 4 tablet 0   rifaximin (XIFAXAN) 550 MG TABS tablet Take 1 tablet (550 mg total) by mouth 2 (two) times daily. (Patient not taking: Reported on 01/16/2023) 60 tablet 1   No current facility-administered medications for this visit.    Allergies  Allergen Reactions   Bee Venom Anaphylaxis   Codeine Itching    Only itching. Recently allergy tested at [facility]   Lactulose Diarrhea    Patient states causes diarrhea   Novocain [Procaine]    Sulfa Antibiotics Other (See Comments)      Social History   Socioeconomic History   Marital status: Single    Spouse name: Not on file   Number of children: 2   Years of education: 45   Highest education level: 12th grade  Occupational History   Occupation: retired  Tobacco Use   Smoking status: Former   Smokeless tobacco: Never  Building services engineer Use: Not on file  Substance and Sexual Activity   Alcohol use: Never   Drug use: Never   Sexual activity: Never    Birth control/protection: None  Other Topics Concern   Not on file  Social History Narrative   Not on file   Social Determinants of Health   Financial Resource Strain: High Risk (04/27/2022)   Overall Financial Resource Strain (CARDIA)    Difficulty of Paying Living Expenses: Very hard  Food Insecurity: Food Insecurity Present (12/24/2022)   Hunger Vital Sign    Worried About Running Out of Food in the Last Year: Sometimes true    Ran Out of Food in the Last Year: Sometimes true  Transportation Needs: Unmet Transportation Needs (12/24/2022)   PRAPARE - Transportation    Lack of Transportation (Medical): Yes    Lack of Transportation (Non-Medical): Yes  Physical Activity: Sufficiently Active (04/16/2018)   Exercise Vital Sign    Days of Exercise per Week: 7 days    Minutes of Exercise per Session: 30 min  Stress: Stress Concern Present (04/16/2018)   [employer] of Occupational  Health - Occupational Stress Questionnaire    Feeling of Stress : Very much  Social Connections: Moderately Isolated (04/30/2018)   Social Connection and Isolation Panel [NHANES]    Frequency of Communication with Friends and Family: Twice a week    Frequency of Social Gatherings with Friends and Family: Never    Attends Religious Services: More than 4 times per year    Active Member of [employer] or Organizations: No    Attends Banker Meetings: Never    Marital Status: Widowed  Intimate Partner Violence: Not At Risk (12/24/2022)  [name], Afraid, [name], and Kick questionnaire    Fear of Current or Ex-Partner: No    Emotionally Abused: No    Physically Abused: No    Sexually Abused: No      Family History  Problem Relation Age of Onset   Hypertension Mother    Heart disease Mother    Heart failure Maternal Grandmother     PHYSICAL EXAM: Vitals:   01/17/23 1036  BP: (!) 137/58  Pulse: (!) 51  SpO2: 98%   GENERAL: Chronically ill-appearing white male in wheelchair HEENT: Negative for arcus senilis or xanthelasma. There is no scleral icterus.  The mucous membranes are pink and moist.   NECK: Supple, No masses. Normal carotid upstrokes without bruits. No masses or thyromegaly.    CHEST: There are no chest wall deformities. There is no chest wall tenderness. Respirations are unlabored.  Lungs-decreased lung sounds bilaterally CARDIAC:  JVP: 10 cm H2O         Normal S1, S2  Normal rate with regular rhythm. No murmurs, rubs or gallops.  Pulses are 2+ and symmetrical in upper and lower extremities.  2-3+ edema to the knees.  ABDOMEN: Soft, non-tender, non-distended. There are no masses or hepatomegaly. There are normal bowel sounds.  EXTREMITIES: Warm with 2-3+ edema to the knees LYMPHATIC: No axillary or supraclavicular lymphadenopathy.  NEUROLOGIC: Patient is oriented x3 with no focal or lateralizing neurologic deficits.  PSYCH: Patients affect is appropriate, there is  no evidence of anxiety or depression.  SKIN: Warm and dry; no lesions or wounds.   DATA REVIEW  ECG: 01/17/23: sinus bradycardia  as per my personal interpretation  ECHO: 12/24/22: LVEF 60 to 65% with mild LVH and normal diastology.  Normal RV function. as per my personal interpretation 03/26/23: LVEF 60-65%, normal RV function. Normal diastology as per my interpretation.    ASSESSMENT & PLAN:  Heart failure with preserved ejection fraction -Interestingly echocardiograms do not demonstrate any diastolic dysfunction.  However he does have elevated BNP's and symptoms consistent with HFpEF.  Urinalysis without significant proteinuria. -Continue Jardiance 10 mg, increase losartan to 100mg  daily, start spironolactone 25 mg daily -Severely volume overloaded yesterday; received 80 mg IV Lasix.  Had a 10 pound weight loss with improvement in REDS (still elevated to 39%).  -Repeat CMP today, BNP. - Increase lasix to 60mg  BID for the next week; 3+ pitting edema to the knees.   2.  Hypertension -Increase losartan to 100 mg -Serum creatinine stable  3.  Type 2 diabetes -Continue metformin 500 mg daily -A1c in 12/24/2022 5.1  4.  Homelessness -Significant social hurdles to obtain adequate care.  Social work following to assist with medication needs and housing.  5.  Liver cirrhosis -Hepatitis C, positive viral load in 2019 -Follow-up with GI.  [facility] Advanced Heart Failure Mechanical Circulatory Support

## 2023-01-24 ENCOUNTER — Telehealth: Payer: Self-pay | Admitting: Family

## 2023-01-24 ENCOUNTER — Encounter: Payer: 59 | Admitting: Family

## 2023-01-24 NOTE — Telephone Encounter (Signed)
Patient did not show for his Heart Failure Clinic appointment on 01/24/23.  

## 2023-02-01 ENCOUNTER — Telehealth: Payer: Self-pay

## 2023-02-01 NOTE — Telephone Encounter (Signed)
Referrals sent last appointment with Dr. Gasper Lloyd 01/17/23:   You have been referred to pain management clinic. Their office will be giving you a call to schedule your appointment  Fax received from [facility] 02/01/23:  Patient stated he didn't want Intervention Treatments. And declined appointment with [facility]..    Will route to Dr. Gasper Lloyd for FYI.

## 2023-03-02 ENCOUNTER — Other Ambulatory Visit: Payer: Self-pay

## 2023-03-02 ENCOUNTER — Emergency Department
Admission: EM | Admit: 2023-03-02 | Discharge: 2023-03-02 | Disposition: A | Discharge status: Home / Self Care | Payer: 59 | Attending: Emergency Medicine | Admitting: Emergency Medicine

## 2023-03-02 DIAGNOSIS — E7220 Disorder of urea cycle metabolism, unspecified: Secondary | ICD-10-CM

## 2023-03-02 DIAGNOSIS — M546 Pain in thoracic spine: Secondary | ICD-10-CM | POA: Insufficient documentation

## 2023-03-02 DIAGNOSIS — R109 Unspecified abdominal pain: Secondary | ICD-10-CM | POA: Insufficient documentation

## 2023-03-02 LAB — BASIC METABOLIC PANEL
Anion gap: 8 (ref 5–15)
BUN: 14 mg/dL (ref 8–23)
CO2: 23 mmol/L (ref 22–32)
Calcium: 8.3 mg/dL — ABNORMAL LOW (ref 8.9–10.3)
Chloride: 108 mmol/L (ref 98–111)
Creatinine, Ser: 1.09 mg/dL (ref 0.61–1.24)
GFR, Estimated: >60 mL/min (ref >60)
Glucose, Bld: 83 mg/dL (ref 70–99)
Potassium: 3.3 mmol/L — ABNORMAL LOW (ref 3.5–5.1)
Sodium: 139 mmol/L (ref 135–145)

## 2023-03-02 LAB — URINALYSIS, ROUTINE W REFLEX MICROSCOPIC
Bacteria, UA: NONE SEEN
Bilirubin Urine: NEGATIVE
Glucose, UA: NEGATIVE mg/dL
Hgb urine dipstick: NEGATIVE
Ketones, ur: NEGATIVE mg/dL
Leukocytes,Ua: NEGATIVE
Nitrite: NEGATIVE
Protein, ur: 30 mg/dL — AB
Specific Gravity, Urine: 1.018 (ref 1.005–1.030)
pH: 6 (ref 5.0–8.0)

## 2023-03-02 LAB — CBC
HCT: 37.4 % — ABNORMAL LOW (ref 39.0–52.0)
Hemoglobin: 11 g/dL — ABNORMAL LOW (ref 13.0–17.0)
MCH: 29.5 pg (ref 26.0–34.0)
MCHC: 29.4 g/dL — ABNORMAL LOW (ref 30.0–36.0)
MCV: 100.3 fL — ABNORMAL HIGH (ref 80.0–100.0)
Platelets: 79 10*3/uL — ABNORMAL LOW (ref 150–400)
RBC: 3.73 MIL/uL — ABNORMAL LOW (ref 4.22–5.81)
RDW: 16.8 % — ABNORMAL HIGH (ref 11.5–15.5)
WBC: 2.9 10*3/uL — ABNORMAL LOW (ref 4.0–10.5)
nRBC: 0 % (ref 0.0–0.2)

## 2023-03-02 LAB — AMMONIA: Ammonia: 147 umol/L — ABNORMAL HIGH (ref 9–35)

## 2023-03-02 MED ORDER — IBUPROFEN 600 MG PO TABS: 600.0000 mg | ORAL_TABLET | Freq: Three times a day (TID) | ORAL | 0 refills | Status: DC | PRN

## 2023-03-02 MED ORDER — CYCLOBENZAPRINE HCL 10 MG PO TABS: 10.0000 mg | ORAL_TABLET | Freq: Three times a day (TID) | ORAL | 0 refills | Status: AC | PRN

## 2023-03-02 MED ORDER — HYDROCODONE-ACETAMINOPHEN 5-325 MG PO TABS
1.0000 | ORAL_TABLET | Freq: Once | ORAL | Status: AC
  Administered 2023-03-02: 1 via ORAL
  Filled 2023-03-02: qty 1

## 2023-03-02 NOTE — ED Notes (Signed)
First Nurse Note: Pt to ED via [facility] from his home for flank pain x 1 hour. Pt also reports he has not peed in an hour. Pt report that he took 1 nitroglycerin and 2 81 mg aspirin "just in case". HR 51, BP 136/63, 100% on room air, CBG 85.

## 2023-03-02 NOTE — ED Notes (Signed)
Patient provided urinal and encouraged to provide urine specimen.

## 2023-03-02 NOTE — ED Notes (Signed)
Multiple attempts on blood draw. Will call lab.

## 2023-03-02 NOTE — ED Provider Notes (Signed)
[facility] Provider Note   Event Date/Time   First MD Initiated Contact with Patient 03/02/23 1956     (approximate) History  Flank Pain (X 2 hours)  HPI [name] is a 72 y.o. male with stated past medical history of cirrhosis who presents for flank pain that began approximately 44 hours prior to arrival and is described 7/10, aching, bilateral lower thoracic back pain that does not radiate and is worsened with any movement. ROS: Patient currently denies any vision changes, tinnitus, difficulty speaking, facial droop, sore throat, chest pain, shortness of breath, abdominal pain, nausea/vomiting/diarrhea, dysuria, or weakness/numbness/paresthesias in any extremity   Physical Exam  Triage Vital Signs: ED Triage Vitals [03/02/23 1801]  Enc Vitals Group     BP (!) 144/61     Pulse Rate (!) 50     Resp 16     Temp 98.4 F (36.9 C)     Temp Source Oral     SpO2 100 %     Weight 231 lb 7.7 oz (105 kg)     Height 5\' 6"  (1.676 m)     Head Circumference      Peak Flow      Pain Score 7     Pain Loc      Pain Edu?      Excl. in GC?    Most recent vital signs: Vitals:   03/02/23 2215 03/02/23 2300  BP:  (!) 157/66  Pulse: (!) 51 (!) 53  Resp: (!) 22 (!) 22  Temp:  97.8 F (36.6 C)  SpO2: 100% 100%   General: Awake, oriented x4. CV:  Good peripheral perfusion.  Resp:  Normal effort.  Abd:  No distention.  Other:  Elderly overweight Caucasian male laying in bed in no acute distress.  Bilateral paraspinal lower thoracic muscular tenderness to palpation without any midline tenderness. ED Results / Procedures / Treatments  Labs (all labs ordered are listed, but only abnormal results are displayed) Labs Reviewed  URINALYSIS, ROUTINE W REFLEX MICROSCOPIC - Abnormal; Notable for the following components:      Result Value   Color, Urine YELLOW (*)    APPearance CLEAR (*)    Protein, ur 30 (*)    All other components within normal limits  BASIC  METABOLIC PANEL - Abnormal; Notable for the following components:   Potassium 3.3 (*)    Calcium 8.3 (*)    All other components within normal limits  AMMONIA - Abnormal; Notable for the following components:   Ammonia 147 (*)    All other components within normal limits  CBC - Abnormal; Notable for the following components:   WBC 2.9 (*)    RBC 3.73 (*)    Hemoglobin 11.0 (*)    HCT 37.4 (*)    MCV 100.3 (*)    MCHC 29.4 (*)    RDW 16.8 (*)    Platelets 79 (*)    All other components within normal limits  PROCEDURES: Critical Care performed: No .1-3 Lead EKG Interpretation  Performed by: Merwyn Katos, MD Authorized by: Merwyn Katos, MD     Interpretation: normal     ECG rate:  71   ECG rate assessment: normal     Rhythm: sinus rhythm     Ectopy: none     Conduction: normal    MEDICATIONS ORDERED IN ED: Medications  HYDROcodone-acetaminophen (NORCO/VICODIN) 5-325 MG per tablet 1 tablet (1 tablet Oral Given 03/02/23 2149)  IMPRESSION / MDM / ASSESSMENT AND PLAN / ED COURSE  I reviewed the triage vital signs and the nursing notes.                             The patient is on the cardiac monitor to evaluate for evidence of arrhythmia and/or significant heart rate changes. Patient's presentation is most consistent with acute presentation with potential threat to life or bodily function. Patient presents for low back pain. Given History and Exam the patient appears to be at low risk for Spinal Cord Compression Syndrome, Vertebral Malignancy/Mets, acute Spinal Fracture, Vertebral Osteomyelitis, Epidural Abscess, Infected or Obstructing Kidney Stone.  Their presentation appears most likely to be secondary to non-emergent musculoskeletal etiology vs non-emergent disc herniation.  ED Workup: Defer imaging and labwork for outpatient follow up at this time. Patient has incidentally found elevated ammonia to 147.  Patient states that he is taking his lactulose.  Patient is  alert and oriented x 4 and shows no evidence of hip hepatic encephalopathy at this time.  Patient was encouraged to continue his lactulose and Xifaxan on time and as prescribed. Disposition: Discharge. Strict return precautions discussed with patient with full understanding. Advised patient to follow up promptly with primary care provider   FINAL CLINICAL IMPRESSION(S) / ED DIAGNOSES   Final diagnoses:  Flank pain  Hyperammonemia (HCC)   Rx / DC Orders   ED Discharge Orders          Ordered    cyclobenzaprine (FLEXERIL) 10 MG tablet  3 times daily PRN        03/02/23 2248    ibuprofen (ADVIL) 600 MG tablet  Every 8 hours PRN        03/02/23 2248           Note:  This document was prepared using Dragon voice recognition software and may include unintentional dictation errors.   Merwyn Katos, MD 03/02/23 [phone]

## 2023-03-02 NOTE — ED Triage Notes (Signed)
[facility] for flank pain. Pt is caox4, in no acute distress in triage. Pt took some ASA and NTG with no relief.

## 2023-03-12 ENCOUNTER — Other Ambulatory Visit: Payer: Self-pay

## 2023-03-12 ENCOUNTER — Emergency Department
Admission: EM | Admit: 2023-03-12 | Discharge: 2023-03-13 | Disposition: A | Discharge status: Home / Self Care | Payer: 59 | Attending: Emergency Medicine | Admitting: Emergency Medicine

## 2023-03-12 DIAGNOSIS — R531 Weakness: Secondary | ICD-10-CM | POA: Insufficient documentation

## 2023-03-12 DIAGNOSIS — E119 Type 2 diabetes mellitus without complications: Secondary | ICD-10-CM | POA: Insufficient documentation

## 2023-03-12 DIAGNOSIS — E7220 Disorder of urea cycle metabolism, unspecified: Secondary | ICD-10-CM | POA: Insufficient documentation

## 2023-03-12 DIAGNOSIS — G894 Chronic pain syndrome: Secondary | ICD-10-CM | POA: Diagnosis not present

## 2023-03-12 DIAGNOSIS — I509 Heart failure, unspecified: Secondary | ICD-10-CM | POA: Diagnosis not present

## 2023-03-12 DIAGNOSIS — I110 Hypertensive heart disease with heart failure: Secondary | ICD-10-CM | POA: Diagnosis not present

## 2023-03-12 LAB — CBC WITH DIFFERENTIAL/PLATELET
Abs Immature Granulocytes: 0.01 10*3/uL (ref 0.00–0.07)
Basophils Absolute: 0 10*3/uL (ref 0.0–0.1)
Basophils Relative: 1 %
Eosinophils Absolute: 0.1 10*3/uL (ref 0.0–0.5)
Eosinophils Relative: 4 %
HCT: 30.4 % — ABNORMAL LOW (ref 39.0–52.0)
Hemoglobin: 10.3 g/dL — ABNORMAL LOW (ref 13.0–17.0)
Immature Granulocytes: 0 %
Lymphocytes Relative: 31 %
Lymphs Abs: 1 10*3/uL (ref 0.7–4.0)
MCH: 30.4 pg (ref 26.0–34.0)
MCHC: 33.9 g/dL (ref 30.0–36.0)
MCV: 89.7 fL (ref 80.0–100.0)
Monocytes Absolute: 0.4 10*3/uL (ref 0.1–1.0)
Monocytes Relative: 12 %
Neutro Abs: 1.7 10*3/uL (ref 1.7–7.7)
Neutrophils Relative %: 52 %
Platelets: 88 10*3/uL — ABNORMAL LOW (ref 150–400)
RBC: 3.39 MIL/uL — ABNORMAL LOW (ref 4.22–5.81)
RDW: 16.7 % — ABNORMAL HIGH (ref 11.5–15.5)
WBC: 3.2 10*3/uL — ABNORMAL LOW (ref 4.0–10.5)
nRBC: 0 % (ref 0.0–0.2)

## 2023-03-12 LAB — COMPREHENSIVE METABOLIC PANEL
ALT: 17 U/L (ref 0–44)
AST: 31 U/L (ref 15–41)
Albumin: 2.4 g/dL — ABNORMAL LOW (ref 3.5–5.0)
Alkaline Phosphatase: 87 U/L (ref 38–126)
Anion gap: 5 (ref 5–15)
BUN: 17 mg/dL (ref 8–23)
CO2: 22 mmol/L (ref 22–32)
Calcium: 8.7 mg/dL — ABNORMAL LOW (ref 8.9–10.3)
Chloride: 111 mmol/L (ref 98–111)
Creatinine, Ser: 1.07 mg/dL (ref 0.61–1.24)
GFR, Estimated: >60 mL/min (ref >60)
Glucose, Bld: 118 mg/dL — ABNORMAL HIGH (ref 70–99)
Potassium: 3.8 mmol/L (ref 3.5–5.1)
Sodium: 138 mmol/L (ref 135–145)
Total Bilirubin: 1.2 mg/dL (ref 0.3–1.2)
Total Protein: 6.2 g/dL — ABNORMAL LOW (ref 6.5–8.1)

## 2023-03-12 LAB — URINALYSIS, ROUTINE W REFLEX MICROSCOPIC
Bacteria, UA: NONE SEEN
Bilirubin Urine: NEGATIVE
Glucose, UA: NEGATIVE mg/dL
Hgb urine dipstick: NEGATIVE
Ketones, ur: NEGATIVE mg/dL
Leukocytes,Ua: NEGATIVE
Nitrite: NEGATIVE
Protein, ur: 30 mg/dL — AB
Specific Gravity, Urine: 1.018 (ref 1.005–1.030)
Squamous Epithelial / HPF: NONE SEEN /HPF (ref 0–5)
pH: 6 (ref 5.0–8.0)

## 2023-03-12 LAB — CK: Total CK: 67 U/L (ref 49–397)

## 2023-03-12 LAB — LIPASE, BLOOD: Lipase: 37 U/L (ref 11–51)

## 2023-03-12 LAB — AMMONIA: Ammonia: 141 umol/L — ABNORMAL HIGH (ref 9–35)

## 2023-03-12 MED ORDER — OXYCODONE HCL 5 MG PO TABS
5.0000 mg | ORAL_TABLET | Freq: Once | ORAL | Status: AC
  Administered 2023-03-13: 5 mg via ORAL
  Filled 2023-03-12: qty 1

## 2023-03-12 NOTE — ED Triage Notes (Addendum)
Pt presents to ER via [facility] from streets with c/o weakness, pain all over, and "not feeling like myself."  Pt states "every cell of my body is a 10/10" when asked where his pain was the worst and states pain started yesterday, but has become worse in last 24 hours.  Pt also states he feels like his ammonia is going up, and is feeling nauseous.  Was seen here recently for flank pain and high ammonia levels.  Pt is otherwise A&O x4 and in NAD in triage.

## 2023-03-12 NOTE — ED Provider Notes (Signed)
[facility] Provider Note    Event Date/Time   First MD Initiated Contact with Patient 03/12/23 2318     (approximate)   History   Weakness   HPI  [name] is a 72 y.o. male who presents to the ED for evaluation of Weakness   I review a heart failure clinic visit note from 2 months ago.  History of chronic pain, CHF, HTN, DM and hep C with underlying HCC.   Physical Exam   Triage Vital Signs: ED Triage Vitals  Encounter Vitals Group     BP 03/12/23 2006 (!) 147/71     Systolic BP Percentile --      Diastolic BP Percentile --      Pulse Rate 03/12/23 2006 (!) 55     Resp 03/12/23 2006 18     Temp 03/12/23 2006 98.8 F (37.1 C)     Temp Source 03/12/23 2006 Oral     SpO2 03/12/23 2006 97 %     Weight 03/12/23 2010 245 lb (111.1 kg)     Height 03/12/23 2010 5\' 6"  (1.676 m)     Head Circumference --      Peak Flow --      Pain Score 03/12/23 2009 10     Pain Loc --      Pain Education --      Exclude from Growth Chart --     Most recent vital signs: Vitals:   03/12/23 2006  BP: (!) 147/71  Pulse: (!) 55  Resp: 18  Temp: 98.8 F (37.1 C)  SpO2: 97%    General: Awake, no distress. *** CV:  Good peripheral perfusion.  Resp:  Normal effort.  Abd:  No distention.  MSK:  No deformity noted.  Neuro:  No focal deficits appreciated. Other:     ED Results / Procedures / Treatments   Labs (all labs ordered are listed, but only abnormal results are displayed) Labs Reviewed  CBC WITH DIFFERENTIAL/PLATELET - Abnormal; Notable for the following components:      Result Value   WBC 3.2 (*)    RBC 3.39 (*)    Hemoglobin 10.3 (*)    HCT 30.4 (*)    RDW 16.7 (*)    Platelets 88 (*)    All other components within normal limits  COMPREHENSIVE METABOLIC PANEL - Abnormal; Notable for the following components:   Glucose, Bld 118 (*)    Calcium 8.7 (*)    Total Protein 6.2 (*)    Albumin 2.4 (*)    All other components within normal  limits  URINALYSIS, ROUTINE W REFLEX MICROSCOPIC - Abnormal; Notable for the following components:   Color, Urine YELLOW (*)    APPearance CLEAR (*)    Protein, ur 30 (*)    All other components within normal limits  AMMONIA - Abnormal; Notable for the following components:   Ammonia 141 (*)    All other components within normal limits  CK  LIPASE, BLOOD    EKG Sinus rhythm with a rate of 55 bpm.  Normal axis and intervals.  No clear signs of acute ischemia.  RADIOLOGY ***  Official radiology report(s): No results found.  PROCEDURES and INTERVENTIONS:  Procedures  Medications - No data to display   IMPRESSION / MDM / ASSESSMENT AND PLAN / ED COURSE  I reviewed the triage vital signs and the nursing notes.  Differential diagnosis includes, but is not limited to, ***  {Patient presents  with symptoms of an acute illness or injury that is potentially life-threatening.}      FINAL CLINICAL IMPRESSION(S) / ED DIAGNOSES   Final diagnoses:  None     Rx / DC Orders   ED Discharge Orders     None        Note:  This document was prepared using Dragon voice recognition software and may include unintentional dictation errors.

## 2023-03-13 DIAGNOSIS — R531 Weakness: Secondary | ICD-10-CM | POA: Diagnosis not present

## 2023-03-13 NOTE — ED Notes (Signed)
Patient given snack box and soda

## 2023-03-23 ENCOUNTER — Emergency Department: Payer: 59

## 2023-03-23 ENCOUNTER — Inpatient Hospital Stay
Admission: EM | Admit: 2023-03-23 | Discharge: 2023-03-28 | DRG: 872 | Disposition: A | Discharge status: Home Health | Payer: 59 | Attending: Internal Medicine | Admitting: Internal Medicine

## 2023-03-23 ENCOUNTER — Other Ambulatory Visit: Payer: Self-pay

## 2023-03-23 DIAGNOSIS — E119 Type 2 diabetes mellitus without complications: Secondary | ICD-10-CM | POA: Diagnosis present

## 2023-03-23 DIAGNOSIS — E2740 Unspecified adrenocortical insufficiency: Secondary | ICD-10-CM | POA: Diagnosis present

## 2023-03-23 DIAGNOSIS — M79605 Pain in left leg: Secondary | ICD-10-CM | POA: Insufficient documentation

## 2023-03-23 DIAGNOSIS — F32A Depression, unspecified: Secondary | ICD-10-CM | POA: Diagnosis present

## 2023-03-23 DIAGNOSIS — Z8616 Personal history of COVID-19: Secondary | ICD-10-CM | POA: Diagnosis not present

## 2023-03-23 DIAGNOSIS — E876 Hypokalemia: Secondary | ICD-10-CM | POA: Diagnosis present

## 2023-03-23 DIAGNOSIS — K7460 Unspecified cirrhosis of liver: Secondary | ICD-10-CM | POA: Diagnosis present

## 2023-03-23 DIAGNOSIS — R17 Unspecified jaundice: Secondary | ICD-10-CM | POA: Insufficient documentation

## 2023-03-23 DIAGNOSIS — E1169 Type 2 diabetes mellitus with other specified complication: Secondary | ICD-10-CM

## 2023-03-23 DIAGNOSIS — Z79899 Other long term (current) drug therapy: Secondary | ICD-10-CM | POA: Diagnosis not present

## 2023-03-23 DIAGNOSIS — R6520 Severe sepsis without septic shock: Secondary | ICD-10-CM | POA: Diagnosis present

## 2023-03-23 DIAGNOSIS — Z8673 Personal history of transient ischemic attack (TIA), and cerebral infarction without residual deficits: Secondary | ICD-10-CM | POA: Diagnosis not present

## 2023-03-23 DIAGNOSIS — Z5900 Homelessness unspecified: Secondary | ICD-10-CM | POA: Diagnosis not present

## 2023-03-23 DIAGNOSIS — I110 Hypertensive heart disease with heart failure: Secondary | ICD-10-CM | POA: Diagnosis present

## 2023-03-23 DIAGNOSIS — Z8249 Family history of ischemic heart disease and other diseases of the circulatory system: Secondary | ICD-10-CM

## 2023-03-23 DIAGNOSIS — L03116 Cellulitis of left lower limb: Secondary | ICD-10-CM | POA: Diagnosis present

## 2023-03-23 DIAGNOSIS — Z8619 Personal history of other infectious and parasitic diseases: Secondary | ICD-10-CM | POA: Diagnosis present

## 2023-03-23 DIAGNOSIS — Z885 Allergy status to narcotic agent status: Secondary | ICD-10-CM | POA: Diagnosis not present

## 2023-03-23 DIAGNOSIS — G4730 Sleep apnea, unspecified: Secondary | ICD-10-CM | POA: Diagnosis present

## 2023-03-23 DIAGNOSIS — D72829 Elevated white blood cell count, unspecified: Secondary | ICD-10-CM | POA: Insufficient documentation

## 2023-03-23 DIAGNOSIS — E872 Acidosis, unspecified: Secondary | ICD-10-CM | POA: Diagnosis present

## 2023-03-23 DIAGNOSIS — R296 Repeated falls: Secondary | ICD-10-CM | POA: Diagnosis present

## 2023-03-23 DIAGNOSIS — J4489 Other specified chronic obstructive pulmonary disease: Secondary | ICD-10-CM | POA: Diagnosis present

## 2023-03-23 DIAGNOSIS — A419 Sepsis, unspecified organism: Principal | ICD-10-CM | POA: Diagnosis present

## 2023-03-23 DIAGNOSIS — Z87891 Personal history of nicotine dependence: Secondary | ICD-10-CM

## 2023-03-23 DIAGNOSIS — I5032 Chronic diastolic (congestive) heart failure: Secondary | ICD-10-CM | POA: Diagnosis present

## 2023-03-23 DIAGNOSIS — Z7984 Long term (current) use of oral hypoglycemic drugs: Secondary | ICD-10-CM | POA: Diagnosis not present

## 2023-03-23 DIAGNOSIS — E785 Hyperlipidemia, unspecified: Secondary | ICD-10-CM | POA: Diagnosis present

## 2023-03-23 DIAGNOSIS — Z91030 Bee allergy status: Secondary | ICD-10-CM

## 2023-03-23 DIAGNOSIS — Z7982 Long term (current) use of aspirin: Secondary | ICD-10-CM

## 2023-03-23 DIAGNOSIS — I10 Essential (primary) hypertension: Secondary | ICD-10-CM | POA: Diagnosis present

## 2023-03-23 DIAGNOSIS — R197 Diarrhea, unspecified: Principal | ICD-10-CM

## 2023-03-23 DIAGNOSIS — R7989 Other specified abnormal findings of blood chemistry: Secondary | ICD-10-CM | POA: Diagnosis present

## 2023-03-23 DIAGNOSIS — J449 Chronic obstructive pulmonary disease, unspecified: Secondary | ICD-10-CM | POA: Diagnosis present

## 2023-03-23 DIAGNOSIS — Z882 Allergy status to sulfonamides status: Secondary | ICD-10-CM

## 2023-03-23 LAB — BRAIN NATRIURETIC PEPTIDE: B Natriuretic Peptide: 1563.3 pg/mL — ABNORMAL HIGH (ref 0.0–100.0)

## 2023-03-23 LAB — CBC WITH DIFFERENTIAL/PLATELET
Abs Immature Granulocytes: 0.1 10*3/uL — ABNORMAL HIGH (ref 0.00–0.07)
Basophils Absolute: 0 10*3/uL (ref 0.0–0.1)
Basophils Relative: 0 %
Eosinophils Absolute: 0 10*3/uL (ref 0.0–0.5)
Eosinophils Relative: 0 %
HCT: 33.8 % — ABNORMAL LOW (ref 39.0–52.0)
Hemoglobin: 11.1 g/dL — ABNORMAL LOW (ref 13.0–17.0)
Immature Granulocytes: 1 %
Lymphocytes Relative: 5 %
Lymphs Abs: 0.5 10*3/uL — ABNORMAL LOW (ref 0.7–4.0)
MCH: 30.2 pg (ref 26.0–34.0)
MCHC: 32.8 g/dL (ref 30.0–36.0)
MCV: 92.1 fL (ref 80.0–100.0)
Monocytes Absolute: 0.6 10*3/uL (ref 0.1–1.0)
Monocytes Relative: 5 %
Neutro Abs: 10 10*3/uL — ABNORMAL HIGH (ref 1.7–7.7)
Neutrophils Relative %: 89 %
Platelets: 75 10*3/uL — ABNORMAL LOW (ref 150–400)
RBC: 3.67 MIL/uL — ABNORMAL LOW (ref 4.22–5.81)
RDW: 16.8 % — ABNORMAL HIGH (ref 11.5–15.5)
Smear Review: DECREASED
WBC: 11.2 10*3/uL — ABNORMAL HIGH (ref 4.0–10.5)
nRBC: 0 % (ref 0.0–0.2)

## 2023-03-23 LAB — PROTIME-INR
INR: 1.4 — ABNORMAL HIGH (ref 0.8–1.2)
Prothrombin Time: 17.6 seconds — ABNORMAL HIGH (ref 11.4–15.2)

## 2023-03-23 LAB — GLUCOSE, CAPILLARY
Glucose-Capillary: 76 mg/dL (ref 70–99)
Glucose-Capillary: 86 mg/dL (ref 70–99)

## 2023-03-23 LAB — LACTIC ACID, PLASMA
Lactic Acid, Venous: 4 mmol/L (ref 0.5–1.9)
Lactic Acid, Venous: 3 mmol/L (ref 0.5–1.9)

## 2023-03-23 LAB — COMPREHENSIVE METABOLIC PANEL
ALT: 21 U/L (ref 0–44)
AST: 41 U/L (ref 15–41)
Albumin: 2.6 g/dL — ABNORMAL LOW (ref 3.5–5.0)
Alkaline Phosphatase: 71 U/L (ref 38–126)
Anion gap: 11 (ref 5–15)
BUN: 14 mg/dL (ref 8–23)
CO2: 21 mmol/L — ABNORMAL LOW (ref 22–32)
Calcium: 8.4 mg/dL — ABNORMAL LOW (ref 8.9–10.3)
Chloride: 104 mmol/L (ref 98–111)
Creatinine, Ser: 1.15 mg/dL (ref 0.61–1.24)
GFR, Estimated: >60 mL/min (ref >60)
Glucose, Bld: 102 mg/dL — ABNORMAL HIGH (ref 70–99)
Potassium: 3.2 mmol/L — ABNORMAL LOW (ref 3.5–5.1)
Sodium: 136 mmol/L (ref 135–145)
Total Bilirubin: 3.3 mg/dL — ABNORMAL HIGH (ref 0.3–1.2)
Total Protein: 6.9 g/dL (ref 6.5–8.1)

## 2023-03-23 LAB — TROPONIN I (HIGH SENSITIVITY)
Troponin I (High Sensitivity): 133 ng/L (ref <18)
Troponin I (High Sensitivity): 111 ng/L (ref <18)

## 2023-03-23 LAB — D-DIMER, QUANTITATIVE: D-Dimer, Quant: 1.39 ug/mL-FEU — ABNORMAL HIGH (ref 0.00–0.50)

## 2023-03-23 LAB — APTT: aPTT: 36 seconds (ref 24–36)

## 2023-03-23 LAB — MAGNESIUM: Magnesium: 1.8 mg/dL (ref 1.7–2.4)

## 2023-03-23 LAB — SARS CORONAVIRUS 2 BY RT PCR: SARS Coronavirus 2 by RT PCR: NEGATIVE

## 2023-03-23 MED ORDER — SODIUM CHLORIDE 0.9 % IV SOLN: 2.0000 g | INTRAVENOUS | Status: DC

## 2023-03-23 MED ORDER — INSULIN ASPART 100 UNIT/ML IJ SOLN
0.0000 [IU] | Freq: Three times a day (TID) | INTRAMUSCULAR | Status: DC
  Administered 2023-03-24: 2 [IU] via SUBCUTANEOUS
  Administered 2023-03-26 – 2023-03-27 (×2): 3 [IU] via SUBCUTANEOUS
  Filled 2023-03-23 (×3): qty 1

## 2023-03-23 MED ORDER — OXYCODONE-ACETAMINOPHEN 5-325 MG PO TABS
1.0000 | ORAL_TABLET | Freq: Four times a day (QID) | ORAL | Status: AC | PRN
  Administered 2023-03-23: 1 via ORAL
  Filled 2023-03-23: qty 1

## 2023-03-23 MED ORDER — LACTATED RINGERS IV BOLUS (SEPSIS)
30.0000 mL/kg | Freq: Once | INTRAVENOUS | Status: AC
  Administered 2023-03-23: 1914 mL via INTRAVENOUS

## 2023-03-23 MED ORDER — NITROGLYCERIN 0.4 MG SL SUBL: 0.4000 mg | SUBLINGUAL_TABLET | SUBLINGUAL | Status: DC | PRN

## 2023-03-23 MED ORDER — ASPIRIN 81 MG PO TBEC
81.0000 mg | DELAYED_RELEASE_TABLET | Freq: Every day | ORAL | Status: DC
  Administered 2023-03-24 – 2023-03-28 (×5): 81 mg via ORAL
  Filled 2023-03-23 (×5): qty 1

## 2023-03-23 MED ORDER — FUROSEMIDE 40 MG PO TABS
40.0000 mg | ORAL_TABLET | Freq: Every day | ORAL | Status: DC
  Administered 2023-03-24 – 2023-03-28 (×5): 40 mg via ORAL
  Filled 2023-03-23 (×5): qty 1

## 2023-03-23 MED ORDER — ASPIRIN 81 MG PO CHEW
324.0000 mg | CHEWABLE_TABLET | Freq: Once | ORAL | Status: AC
  Administered 2023-03-23: 324 mg via ORAL
  Filled 2023-03-23: qty 4

## 2023-03-23 MED ORDER — ATORVASTATIN CALCIUM 20 MG PO TABS
40.0000 mg | ORAL_TABLET | Freq: Every day | ORAL | Status: DC
  Administered 2023-03-23 – 2023-03-27 (×5): 40 mg via ORAL
  Filled 2023-03-23 (×5): qty 2

## 2023-03-23 MED ORDER — ACETAMINOPHEN 650 MG RE SUPP: 650.0000 mg | Freq: Four times a day (QID) | RECTAL | Status: DC | PRN

## 2023-03-23 MED ORDER — ONDANSETRON HCL 4 MG PO TABS: 4.0000 mg | ORAL_TABLET | Freq: Four times a day (QID) | ORAL | Status: AC | PRN

## 2023-03-23 MED ORDER — ALBUTEROL SULFATE (2.5 MG/3ML) 0.083% IN NEBU: 2.5000 mg | INHALATION_SOLUTION | Freq: Four times a day (QID) | RESPIRATORY_TRACT | Status: AC | PRN

## 2023-03-23 MED ORDER — ONDANSETRON HCL 4 MG/2ML IJ SOLN
4.0000 mg | Freq: Four times a day (QID) | INTRAMUSCULAR | Status: AC | PRN
  Administered 2023-03-25: 4 mg via INTRAVENOUS
  Filled 2023-03-23: qty 2

## 2023-03-23 MED ORDER — LACTATED RINGERS IV BOLUS (SEPSIS)
500.0000 mL | Freq: Once | INTRAVENOUS | Status: AC
  Administered 2023-03-23: 500 mL via INTRAVENOUS

## 2023-03-23 MED ORDER — GUAIFENESIN 100 MG/5ML PO LIQD: 5.0000 mL | ORAL | Status: AC | PRN

## 2023-03-23 MED ORDER — ENOXAPARIN SODIUM 60 MG/0.6ML IJ SOSY
0.5000 mg/kg | PREFILLED_SYRINGE | INTRAMUSCULAR | Status: DC
  Administered 2023-03-23 – 2023-03-27 (×5): 55 mg via SUBCUTANEOUS
  Filled 2023-03-23 (×5): qty 0.6

## 2023-03-23 MED ORDER — ENSURE ENLIVE PO LIQD
237.0000 mL | Freq: Two times a day (BID) | ORAL | Status: DC
  Administered 2023-03-24 – 2023-03-28 (×10): 237 mL via ORAL

## 2023-03-23 MED ORDER — OXYCODONE HCL 5 MG PO TABS
5.0000 mg | ORAL_TABLET | Freq: Four times a day (QID) | ORAL | Status: DC | PRN
  Administered 2023-03-23 – 2023-03-27 (×10): 5 mg via ORAL
  Filled 2023-03-23 (×11): qty 1

## 2023-03-23 MED ORDER — IOHEXOL 350 MG/ML SOLN
75.0000 mL | Freq: Once | INTRAVENOUS | Status: AC | PRN
  Administered 2023-03-23: 75 mL via INTRAVENOUS

## 2023-03-23 MED ORDER — LOSARTAN POTASSIUM 50 MG PO TABS
50.0000 mg | ORAL_TABLET | Freq: Every day | ORAL | Status: DC
  Administered 2023-03-24: 50 mg via ORAL
  Filled 2023-03-23: qty 1

## 2023-03-23 MED ORDER — ACETAMINOPHEN 325 MG PO TABS: 650.0000 mg | ORAL_TABLET | Freq: Four times a day (QID) | ORAL | Status: DC | PRN

## 2023-03-23 MED ORDER — SODIUM CHLORIDE 0.9 % IV SOLN
2.0000 g | INTRAVENOUS | Status: DC
  Administered 2023-03-23: 2 g via INTRAVENOUS
  Filled 2023-03-23 (×2): qty 20

## 2023-03-23 MED ORDER — ONDANSETRON HCL 4 MG/2ML IJ SOLN
4.0000 mg | Freq: Once | INTRAMUSCULAR | Status: AC
  Administered 2023-03-23: 4 mg via INTRAVENOUS
  Filled 2023-03-23: qty 2

## 2023-03-23 MED ORDER — INSULIN ASPART 100 UNIT/ML IJ SOLN: 0.0000 [IU] | Freq: Every day | INTRAMUSCULAR | Status: DC

## 2023-03-23 MED ORDER — RIFAXIMIN 550 MG PO TABS
550.0000 mg | ORAL_TABLET | Freq: Two times a day (BID) | ORAL | Status: DC
  Administered 2023-03-23 – 2023-03-28 (×10): 550 mg via ORAL
  Filled 2023-03-23 (×10): qty 1

## 2023-03-23 MED ORDER — CITALOPRAM HYDROBROMIDE 10 MG PO TABS
20.0000 mg | ORAL_TABLET | Freq: Every day | ORAL | Status: DC
  Administered 2023-03-24 – 2023-03-28 (×5): 20 mg via ORAL
  Filled 2023-03-23 (×5): qty 2

## 2023-03-23 MED ORDER — SENNOSIDES-DOCUSATE SODIUM 8.6-50 MG PO TABS: 1.0000 | ORAL_TABLET | Freq: Every evening | ORAL | Status: DC | PRN

## 2023-03-23 MED ORDER — POTASSIUM CHLORIDE CRYS ER 20 MEQ PO TBCR
40.0000 meq | EXTENDED_RELEASE_TABLET | Freq: Once | ORAL | Status: AC
  Administered 2023-03-23: 40 meq via ORAL
  Filled 2023-03-23: qty 2

## 2023-03-23 MED ORDER — SPIRONOLACTONE 25 MG PO TABS
25.0000 mg | ORAL_TABLET | Freq: Every day | ORAL | Status: DC
  Administered 2023-03-23 – 2023-03-24 (×2): 25 mg via ORAL
  Filled 2023-03-23 (×2): qty 1

## 2023-03-23 MED ORDER — HYDRALAZINE HCL 10 MG PO TABS: 10.0000 mg | ORAL_TABLET | Freq: Four times a day (QID) | ORAL | Status: AC | PRN

## 2023-03-23 MED ORDER — VITAMIN C 500 MG PO TABS
500.0000 mg | ORAL_TABLET | Freq: Every day | ORAL | Status: DC
  Administered 2023-03-24 – 2023-03-28 (×5): 500 mg via ORAL
  Filled 2023-03-23 (×5): qty 1

## 2023-03-23 MED ORDER — HYDROCORTISONE 10 MG PO TABS
10.0000 mg | ORAL_TABLET | Freq: Every day | ORAL | Status: DC
  Administered 2023-03-24 – 2023-03-28 (×5): 10 mg via ORAL
  Filled 2023-03-23 (×6): qty 1

## 2023-03-23 MED ORDER — ADULT MULTIVITAMIN W/MINERALS CH
1.0000 | ORAL_TABLET | Freq: Every day | ORAL | Status: DC
  Administered 2023-03-24 – 2023-03-28 (×5): 1 via ORAL
  Filled 2023-03-23 (×5): qty 1

## 2023-03-23 MED ORDER — MELATONIN 5 MG PO TABS
5.0000 mg | ORAL_TABLET | Freq: Every evening | ORAL | Status: DC | PRN
  Administered 2023-03-23 – 2023-03-27 (×5): 5 mg via ORAL
  Filled 2023-03-23 (×5): qty 1

## 2023-03-23 MED ORDER — PANTOPRAZOLE SODIUM 40 MG PO TBEC
40.0000 mg | DELAYED_RELEASE_TABLET | Freq: Every day | ORAL | Status: DC
  Administered 2023-03-24 – 2023-03-28 (×5): 40 mg via ORAL
  Filled 2023-03-23 (×5): qty 1

## 2023-03-23 MED ORDER — QUETIAPINE FUMARATE 25 MG PO TABS
50.0000 mg | ORAL_TABLET | Freq: Every day | ORAL | Status: DC
  Administered 2023-03-23 – 2023-03-27 (×5): 50 mg via ORAL
  Filled 2023-03-23 (×5): qty 2

## 2023-03-23 NOTE — Assessment & Plan Note (Signed)
Suspect secondary to patient with history of heart failure preserved ejection fraction and has not been taking his home spironolactone, furosemide, ARB's as prescribed

## 2023-03-23 NOTE — Code Documentation (Signed)
CODE SEPSIS - PHARMACY COMMUNICATION  **Broad Spectrum Antibiotics should be administered within 1 hour of Sepsis diagnosis**  Time Code Sepsis Called/Page Received: 1156  Antibiotics Ordered: ceftriaxone  Time of 1st antibiotic administration: 1224  Additional action taken by pharmacy:   If necessary, Name of Provider/Nurse Contacted:     Sharen Hones ,PharmD Clinical Pharmacist  03/23/2023  12:53 PM

## 2023-03-23 NOTE — Assessment & Plan Note (Addendum)
Patient is not taking his home rifaximin, spironolactone, furosemide in the last few days due to not feeling well, these have been resumed on admission

## 2023-03-23 NOTE — Assessment & Plan Note (Addendum)
Mild, secondary to cellulitis, treat per above; check CBC in a.m.

## 2023-03-23 NOTE — Plan of Care (Signed)
  Problem: Nutritional: Goal: Maintenance of adequate nutrition will improve Outcome: Progressing   Problem: Education: Goal: Knowledge of General Education information will improve Description: Including pain rating scale, medication(s)/side effects and non-pharmacologic comfort measures Outcome: Progressing   Problem: Nutrition: Goal: Adequate nutrition will be maintained Outcome: Progressing   Problem: Coping: Goal: Level of anxiety will decrease Outcome: Progressing   Problem: Pain Managment: Goal: General experience of comfort will improve Outcome: Progressing   Problem: Safety: Goal: Ability to remain free from injury will improve Outcome: Progressing

## 2023-03-23 NOTE — Hospital Course (Addendum)
[name] is a 72 year old male with hyperlipidemia, depression, hypertension, non-insulin-dependent diabetes mellitus, COPD, heart failure preserved ejection fraction, history of hepatitis C, adrenal insufficiency, current unhoused status, who presents to the emergency department for chief concerns of diarrhea, left leg redness. 07/27: admitted for sepsis d/t cellulitis  07/28: continuing IV abx await BCx  07/29: leg still significant erythema, tenderness. BCx neg. Lasix IVx1, elevate legs.  07/30: still reports pain though erythema appears improved. (+)Fever to 102 this afternoon, repeat BCx and add clinda  Consultants:  none  Procedures: none      ASSESSMENT & PLAN:   Principal Problem:   Left leg cellulitis Active Problems:   Hx of transient ischemic attack (TIA)   Liver cirrhosis (HCC)   COPD (chronic obstructive pulmonary disease) (HCC)   HTN (hypertension)   Hyperlipidemia, unspecified   Diabetes mellitus without complication (HCC)   Elevated troponin   History of hepatitis C   Hypokalemia   Depression   Adrenal insufficiency (HCC)   Leukocytosis   Serum total bilirubin elevated   Left leg pain   Left leg cellulitis Severe Sepsis d/t cellulitis - sepsis resolved  Patient meeting sepsis criteria on admission with increased respiration rate, leukocytosis, elevated lactic acid of 4.0, possible organ involvement of cardiac given elevated BNP and high sensitive troponin Suspect source is cellulitis in the left lower extremity Ceftriaxone switched to Cefazolin, but not improving and now febrile, will add clindamycin and consider imaging.  Erythema appears improved which is reassuring but low threshold for imaging especially if not improving w/ clindamycin.  IV fluids d/c d/e edema, normal BP Elevate legs Lasix IV x1 in addition to his usual po dose  Pain control  CBC diff pending  Repeated BCx today d/t fever   Hypokalemia - resolved  Check magnesium level  on admission --> WNL Replace as needed  Follow BMP   Leukocytosis - resolved Mild, secondary to cellulitis,  treat underlying cause Follow CBC   Anemia Mild Likely hemodilutional component Follow CBC  Adrenal insufficiency (HCC) Abnormal ACTH stimulation test Patient was started on hydrocortisone tablets during May 2024 admission Follow outpatient   Liver cirrhosis [facility]) Patient is not taking his home meds in the last few days due to not feeling well home rifaximin, spironolactone, furosemide have been resumed on admission  Hyperlipidemia, unspecified Atorvastatin   HTN (hypertension) but BP on the low side  Home losartan 50 mg daily held, spironolactone 25 mg daily reduced to 12.5  Depression Citalopram 20 mg daily resumed, quetiapine 50 mg nightly resumed  COPD (chronic obstructive pulmonary disease) (HCC) Not in acute exacerbation Albuterol nebulizer every 6 hours as needed for wheezing and shortness of breath  Elevated troponin Suspect secondary to patient with history of heart failure preserved ejection fraction and has not been taking his home spironolactone, furosemide, ARB's as prescribed Recheck if chest pain   Diabetes mellitus without complication (HCC) Patient is not taking his home Jardiance and metformin, with nonfasting blood glucose 102 continue to monitor with insulin SSI and at bedtime coverage  Hx of transient ischemic attack (TIA) Patient has not been taken his home medication over the last week Resumed home aspirin 81 mg, atorvastatin 40 mg daily     DVT prophylaxis: lovenox  Pertinent IV fluids/nutrition: no continuous IV fluids  Central lines / invasive devices: none  Code Status: FULL CODE ACP documentation reviewed: 03/24/23 none on file   Current Admission Status: inpatient   TOC needs / Dispo plan: anticipate home  but pt is unhoused, appreciate possibly getting shleter resources  Barriers to discharge / significant pending items:  clinical improvement

## 2023-03-23 NOTE — Assessment & Plan Note (Signed)
Check magnesium level on admission Replace with potassium chloride 40 mill equivalent p.o. one-time dose Recheck BMP in a.m.

## 2023-03-23 NOTE — ED Provider Notes (Signed)
[facility] Provider Note    Event Date/Time   First MD Initiated Contact with Patient 03/23/23 1035     (approximate)   History   Diarrhea   HPI  [name] is a 72 y.o. male past medical history significant for COPD, diabetes, hypertension, CHF, hepatitis C with underlying HCC, who presents to the emergency department with generalized weakness and diarrhea.  EMS was called out behind a store for worsening diarrhea.  Patient endorses 6-8 episodes of diarrhea over the past 2 days.  Denies any blood in his stool.  Denies recent antibiotic use.  Complaining of abdominal cramping with nausea and vomiting.  Also complaining of swelling and pain to his left lower leg.  States that he has had frequent falls since yesterday when the diarrhea started.  Denies any fever but does endorse chills.  Endorses ongoing shortness of breath that has been chronic for him.  Denies any alcohol or drug use.  Denies any head injury.  Denies being on any anticoagulation.  No history of DVT or PE.     Physical Exam   Triage Vital Signs: ED Triage Vitals  Encounter Vitals Group     BP 03/23/23 1036 135/60     Systolic BP Percentile --      Diastolic BP Percentile --      Pulse Rate 03/23/23 1036 (!) 58     Resp 03/23/23 1036 17     Temp 03/23/23 1036 99.7 F (37.6 C)     Temp Source 03/23/23 1036 Oral     SpO2 03/23/23 1036 99 %     Weight 03/23/23 1037 244 lb 14.9 oz (111.1 kg)     Height 03/23/23 1037 5\' 6"  (1.676 m)     Head Circumference --      Peak Flow --      Pain Score 03/23/23 1037 9     Pain Loc --      Pain Education --      Exclude from Growth Chart --     Most recent vital signs: Vitals:   03/23/23 1330 03/23/23 1500  BP: (!) 134/57 (!) 126/45  Pulse: 64 (!) 57  Resp: 17 17  Temp:  99.2 F (37.3 C)  SpO2: 92% 100%    Physical Exam Constitutional:      Appearance: He is well-developed.  HENT:     Head: Atraumatic.  Eyes:      Conjunctiva/sclera: Conjunctivae normal.  Cardiovascular:     Rate and Rhythm: Regular rhythm.  Pulmonary:     Effort: No respiratory distress.     Breath sounds: No wheezing.  Abdominal:     Tenderness: There is no abdominal tenderness.  Musculoskeletal:     Cervical back: Normal range of motion.     Left lower leg: Edema present.     Comments: Left lower extremity erythema and warmth, increased in size when compared to right lower extremity.  No open wounds.  Skin:    General: Skin is warm.     Capillary Refill: Capillary refill takes less than 2 seconds.  Neurological:     Mental Status: He is alert. Mental status is at baseline.  Psychiatric:        Mood and Affect: Mood normal.     IMPRESSION / MDM / ASSESSMENT AND PLAN / ED COURSE  I reviewed the triage vital signs and the nursing notes.  Differential diagnosis including C. difficile, infectious diarrhea, dehydration, electrolyte abnormality, viral gastroenteritis,  ACS, PE/DVT, anemia, cellulitis  EKG  I, Corena Herter, the attending physician, personally viewed and interpreted this ECG.   Rate: Normal  Rhythm: Normal sinus  Axis: Normal  Intervals: Normal  ST&T Change: None  No tachycardic or bradycardic dysrhythmias while on cardiac telemetry.  RADIOLOGY CTA with no signs of PE   LABS (all labs ordered are listed, but only abnormal results are displayed) Labs interpreted as -    Labs Reviewed  LACTIC ACID, PLASMA - Abnormal; Notable for the following components:      Result Value   Lactic Acid, Venous 4.0 (*)    All other components within normal limits  LACTIC ACID, PLASMA - Abnormal; Notable for the following components:   Lactic Acid, Venous 3.0 (*)    All other components within normal limits  COMPREHENSIVE METABOLIC PANEL - Abnormal; Notable for the following components:   Potassium 3.2 (*)    CO2 21 (*)    Glucose, Bld 102 (*)    Calcium 8.4 (*)    Albumin 2.6 (*)    Total Bilirubin 3.3 (*)     All other components within normal limits  CBC WITH DIFFERENTIAL/PLATELET - Abnormal; Notable for the following components:   WBC 11.2 (*)    RBC 3.67 (*)    Hemoglobin 11.1 (*)    HCT 33.8 (*)    RDW 16.8 (*)    Platelets 75 (*)    Neutro Abs 10.0 (*)    Lymphs Abs 0.5 (*)    Abs Immature Granulocytes 0.10 (*)    All other components within normal limits  PROTIME-INR - Abnormal; Notable for the following components:   Prothrombin Time 17.6 (*)    INR 1.4 (*)    All other components within normal limits  BRAIN NATRIURETIC PEPTIDE - Abnormal; Notable for the following components:   B Natriuretic Peptide 1,563.3 (*)    All other components within normal limits  D-DIMER, QUANTITATIVE - Abnormal; Notable for the following components:   D-Dimer, Quant 1.39 (*)    All other components within normal limits  TROPONIN I (HIGH SENSITIVITY) - Abnormal; Notable for the following components:   Troponin I (High Sensitivity) 133 (*)    All other components within normal limits  TROPONIN I (HIGH SENSITIVITY) - Abnormal; Notable for the following components:   Troponin I (High Sensitivity) 111 (*)    All other components within normal limits  SARS CORONAVIRUS 2 BY RT PCR  CULTURE, BLOOD (ROUTINE X 2)  CULTURE, BLOOD (ROUTINE X 2)  C DIFFICILE QUICK SCREEN W PCR REFLEX    GASTROINTESTINAL PANEL BY PCR, STOOL (REPLACES STOOL CULTURE)  APTT  MAGNESIUM     MDM    Clinical picture concerning for possible cellulitis to the left lower leg.  C. difficile and GI pathogen panel ordered.  Lactic acid significantly elevated at 4.0.  Initially concern for heart failure and felt that 30 cc/kg of IV fluids may be detrimental to the patient, given 1 L bolus and will reevaluate.  On reevaluation no signs of pulmonary edema on CT scan ordered 30 cc/kg of IV fluids.  Started on IV antibiotics to cover for a nonpurulent cellulitis.  CTA without signs of PE and ultrasound of the left lower leg with no signs  of DVT.  Significantly elevated troponin, possible demand ischemia secondary to infectious process.   PROCEDURES:  Critical Care performed: yes  .Critical Care  Performed by: Corena Herter, MD Authorized by: Corena Herter, MD   Critical  care provider statement:    Critical care time (minutes):  45   Critical care time was exclusive of:  Separately billable procedures and treating other patients   Critical care was necessary to treat or prevent imminent or life-threatening deterioration of the following conditions:  Sepsis   Critical care was time spent personally by me on the following activities:  Development of treatment plan with patient or surrogate, discussions with consultants, evaluation of patient's response to treatment, examination of patient, ordering and review of laboratory studies, ordering and review of radiographic studies, ordering and performing treatments and interventions, pulse oximetry, re-evaluation of patient's condition and review of old charts   Patient's presentation is most consistent with acute presentation with potential threat to life or bodily function.   MEDICATIONS ORDERED IN ED: Medications  cefTRIAXone (ROCEPHIN) 2 g in sodium chloride 0.9 % 100 mL IVPB (0 g Intravenous Stopped 03/23/23 1306)  acetaminophen (TYLENOL) tablet 650 mg (has no administration in time range)    Or  acetaminophen (TYLENOL) suppository 650 mg (has no administration in time range)  ondansetron (ZOFRAN) tablet 4 mg (has no administration in time range)    Or  ondansetron (ZOFRAN) injection 4 mg (has no administration in time range)  enoxaparin (LOVENOX) injection 55 mg (has no administration in time range)  senna-docusate (Senokot-S) tablet 1 tablet (has no administration in time range)  hydrALAZINE (APRESOLINE) tablet 10 mg (has no administration in time range)  melatonin tablet 5 mg (has no administration in time range)  albuterol (PROVENTIL) (2.5 MG/3ML) 0.083%  nebulizer solution 2.5 mg (has no administration in time range)  guaiFENesin (ROBITUSSIN) 100 MG/5ML liquid 5 mL (has no administration in time range)  aspirin EC tablet 81 mg (has no administration in time range)  rifaximin (XIFAXAN) tablet 550 mg (has no administration in time range)  atorvastatin (LIPITOR) tablet 40 mg (has no administration in time range)  losartan (COZAAR) tablet 50 mg (has no administration in time range)  nitroGLYCERIN (NITROSTAT) SL tablet 0.4 mg (has no administration in time range)  spironolactone (ALDACTONE) tablet 25 mg (25 mg Oral Given 03/23/23 1512)  QUEtiapine (SEROQUEL) tablet 50 mg (has no administration in time range)  citalopram (CELEXA) tablet 20 mg (has no administration in time range)  hydrocortisone (CORTEF) tablet 10 mg (has no administration in time range)  pantoprazole (PROTONIX) EC tablet 40 mg (has no administration in time range)  ascorbic acid (VITAMIN C) tablet 500 mg (has no administration in time range)  multivitamin with minerals tablet 1 tablet (has no administration in time range)  furosemide (LASIX) tablet 40 mg (has no administration in time range)  insulin aspart (novoLOG) injection 0-15 Units (has no administration in time range)  insulin aspart (novoLOG) injection 0-5 Units (has no administration in time range)  oxyCODONE-acetaminophen (PERCOCET/ROXICET) 5-325 MG per tablet 1 tablet (has no administration in time range)    And  oxyCODONE (Oxy IR/ROXICODONE) immediate release tablet 5 mg (has no administration in time range)  lactated ringers bolus 500 mL (0 mLs Intravenous Stopped 03/23/23 1206)  ondansetron (ZOFRAN) injection 4 mg (4 mg Intravenous Given 03/23/23 1110)  lactated ringers bolus 1,914 mL (1,914 mLs Intravenous New Bag/Given 03/23/23 1221)  aspirin chewable tablet 324 mg (324 mg Oral Given 03/23/23 1220)  iohexol (OMNIPAQUE) 350 MG/ML injection 75 mL (75 mLs Intravenous Contrast Given 03/23/23 1201)  potassium chloride SA  (KLOR-CON M) CR tablet 40 mEq (40 mEq Oral Given 03/23/23 1512)    FINAL CLINICAL IMPRESSION(S) / ED  DIAGNOSES   Final diagnoses:  Diarrhea, unspecified type  Left leg cellulitis     Rx / DC Orders   ED Discharge Orders     None        Note:  This document was prepared using Dragon voice recognition software and may include unintentional dictation errors.   Corena Herter, MD 03/23/23 1558

## 2023-03-23 NOTE — Sepsis Progress Note (Signed)
Elink following code sepsis °

## 2023-03-23 NOTE — Assessment & Plan Note (Signed)
-   Atorvastatin 40 mg nightly resumed 

## 2023-03-23 NOTE — Plan of Care (Signed)

## 2023-03-23 NOTE — Assessment & Plan Note (Signed)
Citalopram 20 mg daily resumed, quetiapine 50 mg nightly resumed

## 2023-03-23 NOTE — Assessment & Plan Note (Addendum)
Patient is not taking his home Jardiance and metformin, and with nonfasting blood glucose 102, we will just continue to monitor with insulin SSI and at bedtime coverage

## 2023-03-23 NOTE — Assessment & Plan Note (Signed)
Patient has not been taken his home medication over the last week Resumed home aspirin 81 mg, atorvastatin 40 mg daily

## 2023-03-23 NOTE — H&P (Signed)
History and Physical   [name] IRJ:[id number] DOB: 1951/07/07 DOA: 03/23/2023  PCP: Drew Aldo, MD  Patient coming from: via EMS  I have personally briefly reviewed patient's old medical records in [facility] EMR.  Chief Concern: Diarrhea, left leg redness  HPI: [name] is a 72 year old male with hyperlipidemia, depression, hypertension, non-insulin-dependent diabetes mellitus, COPD, heart failure preserved ejection fraction, history of hepatitis C, adrenal insufficiency, current unhoused status, who presents to the emergency department for chief concerns of diarrhea, left leg redness.  Vitals in the ED showed temperature of 99.7, respiration rate of 15, heart rate of 58, blood pressure 130/75, SpO2 100% on room air.  Serum sodium is 136, potassium 3.2, chloride 104, bicarb 21, BUN 14, serum creatinine 1.15, EGFR greater than 60, nonfasting blood glucose 102, WBC 11.2, hemoglobin 11.1, platelets of 75.  BNP elevated 1563.3.  D-dimer was elevated 1.3.  Lactic acid elevated at 4.0.  COVID by PCR was negative.  GI panel and C. difficile screening by PCR has been ordered and pending collection.  Blood cultures x 2 are in process.  ED treatment: Aspirin 324 mg p.o. one-time dose, ondansetron 4 mg IV one-time dose, ceftriaxone 2 g IV, 7-day course ordered; LR at 1914 mL and 500 mL bolus were given by EDP. --------------------------- At bedside, patient was able to tell me his name, age, location, current calendar year.  He reports that yesterday he noticed that his left lower extremity started to have redness and pain.  He denies known fever, nausea, vomiting, chest pain, dysuria.  He endorses diarrhea and shortness of breath.  He states he missed the last few days of his home medications due to not feeling well.  He states that his health insurance does pay for his medication and he does not have difficulty obtaining them.  Social history: Patient is currently unhoused,  and has a close relationship with his girlfriend.  Denies tobacco, EtOH, recreational drug use.  ROS: Constitutional: no weight change, no fever ENT/Mouth: no sore throat, no rhinorrhea Eyes: no eye pain, no vision changes Cardiovascular: no chest pain, + dyspnea,  no edema, no palpitations Respiratory: no cough, no sputum, no wheezing Gastrointestinal: no nausea, no vomiting, no diarrhea, no constipation Genitourinary: no urinary incontinence, no dysuria, no hematuria Musculoskeletal: no arthralgias, no myalgias Skin: no skin lesions, no pruritus, + skin redness of left leg Neuro: + weakness, no loss of consciousness, no syncope Psych: no anxiety, no depression, + decrease appetite Heme/Lymph: no bruising, no bleeding  ED Course: Discussed with emergency medicine provider, patient requiring hospitalization for chief concerns of SIRS/sepsis/left lower extremity cellulitis.  Assessment/Plan  Principal Problem:   Left leg cellulitis Active Problems:   Hx of transient ischemic attack (TIA)   Liver cirrhosis (HCC)   COPD (chronic obstructive pulmonary disease) (HCC)   HTN (hypertension)   Hyperlipidemia, unspecified   Diabetes mellitus without complication (HCC)   Elevated troponin   History of hepatitis C   Hypokalemia   Depression   Adrenal insufficiency (HCC)   Leukocytosis   Serum total bilirubin elevated   Left leg pain   Assessment and Plan:  * Left leg cellulitis Patient meeting sepsis criteria with increased respiration rate, leukocytosis, elevated lactic acid of 4.0, possible organ involvement of cardiac given elevated BNP and high sensitive troponin Suspect source is cellulitis in the left lower extremity Continue with ceftriaxone 2 g IV daily Continue with sodium chloride 125 mL/h, 1 day ordered; maintain MAP greater than 65  Admit to telemetry medical, inpatient  Liver cirrhosis (HCC) Patient is not taking his home rifaximin, spironolactone, furosemide in the  last few days due to not feeling well, these have been resumed on admission  Hx of transient ischemic attack (TIA) Patient has not been taken his home medication over the last week Resumed home aspirin 81 mg, atorvastatin 40 mg daily  COPD (chronic obstructive pulmonary disease) (HCC) Not in acute exacerbation Albuterol nebulizer every 6 hours as needed for wheezing and shortness of breath  HTN (hypertension) Home losartan 50 mg daily, spironolactone 25 mg daily resumed  Diabetes mellitus without complication (HCC) Patient is not taking his home Jardiance and metformin, and with nonfasting blood glucose 102, we will just continue to monitor with insulin SSI and at bedtime coverage  Hyperlipidemia, unspecified Atorvastatin 40 mg nightly resumed  Left leg pain Secondary to cellulitis, oxycodone-acetaminophen , 5-325 mg, 1 tablet every 6 hours as needed for moderate pain, at oxycodone 5 mg immediate release every 6 hours as needed for severe pain and breakthrough pain, 1 day of coverage with  Leukocytosis Mild, secondary to cellulitis, treat per above; check CBC in a.m.  Adrenal insufficiency (HCC) Abnormal ACTH stimulation test Patient was started on hydrocortisone tablets during May 2024 admission  Depression Citalopram 20 mg daily resumed, quetiapine 50 mg nightly resumed  Hypokalemia Check magnesium level on admission Replace with potassium chloride 40 mill equivalent p.o. one-time dose Recheck BMP in a.m.  Elevated troponin Suspect secondary to patient with history of heart failure preserved ejection fraction and has not been taking his home spironolactone, furosemide, ARB's as prescribed  Chart reviewed.   DVT prophylaxis: Enoxaparin Code Status: full code Diet: Heart healthy Family Communication: A phone call was offered, patient declined stating that his girlfriend knows he is in the hospital. Disposition Plan: Pending clinical course Consults called: None at  time Admission status: Telemetry medical, inpatient  Past Medical History:  Diagnosis Date   Asthma    CHF (congestive heart failure) (HCC)    COPD (chronic obstructive pulmonary disease) (HCC)    COVID-19 03/2020   diagnosed in August 2021   Diabetes mellitus without complication (HCC)    Homelessness    Hypertension    Infestation by bed bug    Migraine    Obesity    Sleep apnea    Past Surgical History:  Procedure Laterality Date   CARDIAC CATHETERIZATION     CHOLECYSTECTOMY     EYE SURGERY     INNER EAR SURGERY     NOSE SURGERY     XI ROBOTIC LAPAROSCOPIC ASSISTED APPENDECTOMY N/A 11/05/2022   Procedure: XI ROBOTIC LAPAROSCOPIC ASSISTED APPENDECTOMY;  Surgeon: Carolan Shiver, MD;  Location: [facility] ORS;  Service: General;  Laterality: N/A;   Social History:  reports that he has quit smoking. He has never used smokeless tobacco. He reports that he does not drink alcohol and does not use drugs.  Allergies  Allergen Reactions   Bee Venom Anaphylaxis   Codeine Itching    Only itching. Recently allergy tested at [facility]   Lactulose Diarrhea    Patient states causes diarrhea   Novocain [Procaine]    Sulfa Antibiotics Other (See Comments)   Family History  Problem Relation Age of Onset   Hypertension Mother    Heart disease Mother    Heart failure Maternal Grandmother    Family history: Family history reviewed and not pertinent.  Prior to Admission medications   Medication Sig Start Date End Date Taking?  Authorizing Provider  ascorbic acid (VITAMIN C) 500 MG tablet Take 1 tablet (500 mg total) by mouth daily. 04/20/20   Standley Brooking, MD  aspirin EC 81 MG tablet Take 1 tablet (81 mg total) by mouth daily. Swallow whole. 10/04/22   Sarina Ill, DO  atorvastatin (LIPITOR) 40 MG tablet Take 1 tablet (40 mg total) by mouth daily. 01/09/23 01/09/24  Delton Prairie, MD  citalopram (CELEXA) 20 MG tablet Take 1 tablet (20 mg total) by mouth daily. 01/09/23 01/09/24   Delton Prairie, MD  doxepin (SINEQUAN) 50 MG capsule Take 1 capsule (50 mg total) by mouth at bedtime. 01/09/23   Delton Prairie, MD  empagliflozin (JARDIANCE) 10 MG TABS tablet Take 1 tablet (10 mg total) by mouth daily before breakfast. 01/09/23   Delton Prairie, MD  furosemide (LASIX) 40 MG tablet Take 1.5 tablets (60 mg total) by mouth 2 (two) times daily for 5 days, THEN 1 tablet (40 mg total) daily. 01/17/23 01/17/24  Sabharwal, Eliezer Lofts, DO  glucosamine-chondroitin 500-400 MG tablet Take 1 tablet by mouth 3 (three) times daily. Patient not taking: Reported on 01/16/2023    [provider]  hydrocortisone (CORTEF) 10 MG tablet Take 1 tablet (10 mg total) by mouth daily. Patient not taking: Reported on 01/16/2023 01/09/23   Delton Prairie, MD  ibuprofen (ADVIL) 600 MG tablet Take 1 tablet (600 mg total) by mouth every 8 (eight) hours as needed. 03/02/23   Merwyn Katos, MD  losartan (COZAAR) 50 MG tablet Take 1 tablet (50 mg total) by mouth daily. 01/17/23 01/17/24  Sabharwal, Eliezer Lofts, DO  meclizine (ANTIVERT) 25 MG tablet Take 0.5 tablets (12.5 mg total) by mouth 2 (two) times daily as needed for up to 12 doses for dizziness. Patient not taking: Reported on 01/16/2023 11/18/22   Pilar Jarvis, MD  metFORMIN (GLUCOPHAGE) 500 MG tablet Take 500 mg by mouth daily. Patient not taking: Reported on 01/16/2023 10/10/22   [provider]  Multiple Vitamin (MULTIVITAMIN WITH MINERALS) TABS tablet Take 1 tablet by mouth daily. Patient not taking: Reported on 01/16/2023    [provider]  nitroGLYCERIN (NITROLINGUAL) 0.4 MG/SPRAY spray Place 1 spray under the tongue every 5 (five) minutes x 3 doses as needed for chest pain. 01/16/23   Delma Freeze, FNP  nitroGLYCERIN (NITROSTAT) 0.4 MG SL tablet Place 1 tablet (0.4 mg total) under the tongue every 5 (five) minutes x 3 doses as needed for chest pain. Patient not taking: Reported on 01/16/2023 10/04/22   Sarina Ill, DO  oxyCODONE  (ROXICODONE) 5 MG immediate release tablet Take 1 tablet (5 mg total) by mouth every 8 (eight) hours as needed for up to 6 doses. Patient not taking: Reported on 01/16/2023 11/18/22   Pilar Jarvis, MD  pantoprazole (PROTONIX) 40 MG tablet Take 1 tablet (40 mg total) by mouth daily. 01/09/23 01/09/24  Delton Prairie, MD  potassium chloride SA (KLOR-CON M) 20 MEQ tablet Take 1 tablet (20 mEq total) by mouth daily. 01/16/23   Delma Freeze, FNP  QUEtiapine (SEROQUEL) 50 MG tablet Take 1 tablet (50 mg total) by mouth at bedtime. 01/09/23   Delton Prairie, MD  rifaximin (XIFAXAN) 550 MG TABS tablet Take 1 tablet (550 mg total) by mouth 2 (two) times daily. Patient not taking: Reported on 01/16/2023 12/27/22   Enedina Finner, MD  spironolactone (ALDACTONE) 25 MG tablet Take 1 tablet (25 mg total) by mouth daily. 01/17/23 04/17/23  Dorthula Nettles, DO   Physical Exam: Vitals:  03/23/23 1037 03/23/23 1100 03/23/23 1130 03/23/23 1330  BP:  130/75 (!) 143/66 (!) 134/57  Pulse:  (!) 58 (!) 58 64  Resp:  15 (!) 27 17  Temp:      TempSrc:      SpO2:  100% 100% 92%  Weight: 111.1 kg     Height: 5\' 6"  (1.676 m)      Constitutional: appears age-appropriate, frail, NAD, calm Eyes: PERRL, lids and conjunctivae normal ENMT: Mucous membranes are moist. Posterior pharynx clear of any exudate or lesions. Age-appropriate dentition. Hearing appropriate Neck: normal, supple, no masses, no thyromegaly Respiratory: clear to auscultation bilaterally, no wheezing, no crackles. Normal respiratory effort. No accessory muscle use.  Cardiovascular: Regular rate and rhythm, no murmurs / rubs / gallops. No extremity edema. 2+ pedal pulses. No carotid bruits.  Abdomen: Obese abdomen, no tenderness, no masses palpated, no hepatosplenomegaly. Bowel sounds positive.  Musculoskeletal: no clubbing / cyanosis. No joint deformity upper and lower extremities. Good ROM, no contractures, no atrophy. Normal muscle tone.  Skin: Left lower extremity  redness, swelling, tenderness   Neurologic: Sensation intact. Strength 5/5 in all 4.  Psychiatric: Normal judgment and insight. Alert and oriented x 3. Depressed mood.   EKG: independently reviewed, showing sinus bradycardia with rate of 58, QTc 430  Chest x-ray on Admission: I personally reviewed and I agree with radiologist reading as below.  CT Angio Chest PE W/Cm &/Or Wo Cm  Result Date: 03/23/2023 CLINICAL DATA:  Positive D-dimer. Left lower extremity swelling and pain. EXAM: CT ANGIOGRAPHY CHEST WITH CONTRAST TECHNIQUE: Multidetector CT imaging of the chest was performed using the standard protocol during bolus administration of intravenous contrast. Multiplanar CT image reconstructions and MIPs were obtained to evaluate the vascular anatomy. RADIATION DOSE REDUCTION: This exam was performed according to the departmental dose-optimization program which includes automated exposure control, adjustment of the mA and/or kV according to patient size and/or use of iterative reconstruction technique. CONTRAST:  75mL OMNIPAQUE IOHEXOL 350 MG/ML SOLN COMPARISON:  Jan 08, 2023. FINDINGS: Cardiovascular: Satisfactory opacification of the pulmonary arteries to the segmental level. No evidence of pulmonary embolism. Mild cardiomegaly. Enlarged pulmonary artery suggesting pulmonary hypertension. No pericardial effusion. Mediastinum/Nodes: No enlarged mediastinal, hilar, or axillary lymph nodes. Thyroid gland, trachea, and esophagus demonstrate no significant findings. Lungs/Pleura: Lungs are clear. No pleural effusion or pneumothorax. Upper Abdomen: Hepatic cirrhosis. Musculoskeletal: Stable old sternal fracture. No acute osseous abnormality is noted. Review of the MIP images confirms the above findings. IMPRESSION: No definite evidence of pulmonary embolus. Stable other chronic findings as described above. Electronically Signed   By: Lupita Raider M.D.   On: 03/23/2023 12:47   US Venous Img Lower Unilateral  Left  Result Date: 03/23/2023 CLINICAL DATA:  Left lower extremity edema EXAM: LEFT LOWER EXTREMITY VENOUS DOPPLER ULTRASOUND TECHNIQUE: Gray-scale sonography with compression, as well as color and duplex ultrasound, were performed to evaluate the deep venous system(s) from the level of the common femoral vein through the popliteal and proximal calf veins. COMPARISON:  None Available. FINDINGS: VENOUS Normal compressibility of the common femoral, superficial femoral, and popliteal veins, as well as the visualized calf veins. Visualized portions of profunda femoral vein and great saphenous vein unremarkable. No filling defects to suggest DVT on grayscale or color Doppler imaging. Doppler waveforms show normal direction of venous flow, normal respiratory plasticity and response to augmentation. Limited views of the contralateral common femoral vein are unremarkable. OTHER None. Limitations: none IMPRESSION: Negative. Electronically Signed   By:  Malachy Moan M.D.   On: 03/23/2023 11:55   DG Chest Port 1 View  Result Date: 03/23/2023 CLINICAL DATA:  Provided history: Questionable sepsis-evaluate for abnormality. EXAM: PORTABLE CHEST 1 VIEW COMPARISON:  Prior chest radiographs 01/12/2023 and earlier. Chest CT 01/08/2023 FINDINGS: Heart size at the upper limits of normal. Aortic atherosclerosis. Prominence of the main pulmonary artery contour. No appreciable airspace consolidation or pulmonary edema. No evidence of pleural effusion or pneumothorax. No acute osseous abnormality identified. IMPRESSION: 1. No evidence of an acute cardiopulmonary abnormality. 2. Redemonstrated prominence of the main pulmonary artery contour, which suggest pulmonary arterial hypertension. 3. Aortic Atherosclerosis (ICD10-I70.0). Electronically Signed   By: Jackey Loge D.O.   On: 03/23/2023 11:31    Labs on Admission: I have personally reviewed following labs  CBC: Recent Labs  Lab 03/23/23 1046  WBC 11.2*  NEUTROABS 10.0*   HGB 11.1*  HCT 33.8*  MCV 92.1  PLT 75*   Basic Metabolic Panel: Recent Labs  Lab 03/23/23 1046  NA 136  K 3.2*  CL 104  CO2 21*  GLUCOSE 102*  BUN 14  CREATININE 1.15  CALCIUM 8.4*   GFR: Estimated Creatinine Clearance: 66.9 mL/min (by C-G formula based on SCr of 1.15 mg/dL).  Liver Function Tests: Recent Labs  Lab 03/23/23 1046  AST 41  ALT 21  ALKPHOS 71  BILITOT 3.3*  PROT 6.9  ALBUMIN 2.6*   Coagulation Profile: Recent Labs  Lab 03/23/23 1046  INR 1.4*   Urine analysis:    Component Value Date/Time   COLORURINE YELLOW (A) 03/12/2023 2012   APPEARANCEUR CLEAR (A) 03/12/2023 2012   LABSPEC 1.018 03/12/2023 2012   PHURINE 6.0 03/12/2023 2012   GLUCOSEU NEGATIVE 03/12/2023 2012   HGBUR NEGATIVE 03/12/2023 2012   BILIRUBINUR NEGATIVE 03/12/2023 2012   KETONESUR NEGATIVE 03/12/2023 2012   PROTEINUR 30 (A) 03/12/2023 2012   NITRITE NEGATIVE 03/12/2023 2012   LEUKOCYTESUR NEGATIVE 03/12/2023 2012   This document was prepared using Dragon Voice Recognition software and may include unintentional dictation errors.  Dr. Sedalia Muta [facility]  If 7PM-7AM, please contact overnight-coverage provider If 7AM-7PM, please contact day attending provider [email or web address]  03/23/2023, 2:28 PM

## 2023-03-23 NOTE — Assessment & Plan Note (Signed)
Abnormal ACTH stimulation test Patient was started on hydrocortisone tablets during May 2024 admission

## 2023-03-23 NOTE — Assessment & Plan Note (Signed)
Secondary to cellulitis, oxycodone-acetaminophen , 5-325 mg, 1 tablet every 6 hours as needed for moderate pain, at oxycodone 5 mg immediate release every 6 hours as needed for severe pain and breakthrough pain, 1 day of coverage with

## 2023-03-23 NOTE — Assessment & Plan Note (Signed)
Home losartan 50 mg daily, spironolactone 25 mg daily resumed

## 2023-03-23 NOTE — ED Triage Notes (Signed)
Pt brought in by EMS for diarrhea x 2-3 days. Pt also complains of left leg pain, redness, and swelling.

## 2023-03-23 NOTE — Assessment & Plan Note (Signed)
Patient meeting sepsis criteria with increased respiration rate, leukocytosis, elevated lactic acid of 4.0, possible organ involvement of cardiac given elevated BNP and high sensitive troponin Suspect source is cellulitis in the left lower extremity Continue with ceftriaxone 2 g IV daily Continue with sodium chloride 125 mL/h, 1 day ordered; maintain MAP greater than 65 Admit to telemetry medical, inpatient

## 2023-03-23 NOTE — Assessment & Plan Note (Signed)
-   Not in acute exacerbation - Albuterol nebulizer every 6 hours as needed for wheezing and shortness of breath

## 2023-03-24 DIAGNOSIS — L03116 Cellulitis of left lower limb: Secondary | ICD-10-CM | POA: Diagnosis not present

## 2023-03-24 LAB — GLUCOSE, CAPILLARY
Glucose-Capillary: 136 mg/dL — ABNORMAL HIGH (ref 70–99)
Glucose-Capillary: 75 mg/dL (ref 70–99)
Glucose-Capillary: 116 mg/dL — ABNORMAL HIGH (ref 70–99)
Glucose-Capillary: 85 mg/dL (ref 70–99)

## 2023-03-24 LAB — BASIC METABOLIC PANEL WITH GFR
Anion gap: 5 (ref 5–15)
BUN: 18 mg/dL (ref 8–23)
CO2: 23 mmol/L (ref 22–32)
Calcium: 7.8 mg/dL — ABNORMAL LOW (ref 8.9–10.3)
Chloride: 108 mmol/L (ref 98–111)
Creatinine, Ser: 1.03 mg/dL (ref 0.61–1.24)
GFR, Estimated: >60 mL/min (ref >60)
Glucose, Bld: 81 mg/dL (ref 70–99)
Potassium: 3.5 mmol/L (ref 3.5–5.1)
Sodium: 136 mmol/L (ref 135–145)

## 2023-03-24 LAB — CBC
HCT: 29.6 % — ABNORMAL LOW (ref 39.0–52.0)
Hemoglobin: 9.5 g/dL — ABNORMAL LOW (ref 13.0–17.0)
MCH: 30 pg (ref 26.0–34.0)
MCHC: 32.1 g/dL (ref 30.0–36.0)
MCV: 93.4 fL (ref 80.0–100.0)
Platelets: 61 10*3/uL — ABNORMAL LOW (ref 150–400)
RBC: 3.17 MIL/uL — ABNORMAL LOW (ref 4.22–5.81)
RDW: 17 % — ABNORMAL HIGH (ref 11.5–15.5)
WBC: 5.2 10*3/uL (ref 4.0–10.5)
nRBC: 0 % (ref 0.0–0.2)

## 2023-03-24 MED ORDER — SPIRONOLACTONE 12.5 MG HALF TABLET
12.5000 mg | ORAL_TABLET | Freq: Every day | ORAL | Status: DC
  Administered 2023-03-25 – 2023-03-28 (×4): 12.5 mg via ORAL
  Filled 2023-03-24 (×4): qty 1

## 2023-03-24 MED ORDER — CEFAZOLIN SODIUM-DEXTROSE 2-4 GM/100ML-% IV SOLN
2.0000 g | Freq: Three times a day (TID) | INTRAVENOUS | Status: DC
  Administered 2023-03-24 – 2023-03-28 (×12): 2 g via INTRAVENOUS
  Filled 2023-03-24 (×12): qty 100

## 2023-03-24 MED ORDER — IBUPROFEN 400 MG PO TABS
600.0000 mg | ORAL_TABLET | Freq: Four times a day (QID) | ORAL | Status: DC | PRN
  Administered 2023-03-24 – 2023-03-26 (×5): 600 mg via ORAL
  Filled 2023-03-24 (×5): qty 2

## 2023-03-24 NOTE — Discharge Instructions (Signed)
Rent/Utility/Housing  Agency Name: [facility] Address: [address] [place], [place] [address] Phone: [phone] Email: [email or web address] Website: [email or web address] Service(s) Offered: Housing services, self-sufficiency, congregate meal program, weatherization program, Field seismologist program, emergency food assistance,  housing counseling, home ownership program, wheels -towork program.  Agency Name: [facility] Address: [address] [place], [place] [address] Phone: [phone] (8a-4p) [phone] (8p- 10p) Email: [email or web address] Website: [email or web address] Service(s) Offered: A program for homeless and/or needy men that includes one-on-one counseling, life skills training and job rehabilitation.  Agency Name: [facility] Address: [address], [place], [place] [address] Phone: [phone] Website: [email or web address] Service(s) Offered: Assistance to needy in emergency with utility bills, heating fuel, and prescriptions. Shelter for homeless 7pm-7am. December 20, 2016 15  Agency Name: [facility] [place] [facility] Address: [address], [place], [place] [address] Phone: [phone] Contact Person: Cathleen Corti Email: [email or web address] Website: [email or web address] Service(s) Offered: Helps individuals with developmental disabilities move from housing that is more restrictive to homes where they  can achieve greater independence and have more  opportunities.  Agency Name: [facility] Address: [address], [place], [place] [address] Phone: [phone] Email: [email or web address] Website: [email or web address].org Service(s) Offered: Provides affordable housing for low-income families, elderly, and disabled individuals. Offer a wide range of  programs and services, from financial planning to  afterschool and summer programs.  Agency Name: [facility] Address: [address] [place], [place] [address] Phone: [phone] Service(s) Offered: Child support services; child welfare services; food stamps; Medicaid; work first family assistance; and aid with fuel,  rent, food and medicine.  Agency Name: [facility] [place] [employer]. Address: [facility] [address], [place], [place]  [address] Phone: [phone] Website: www.familyabuseservices.org Service(s) Offered: 24 hour Crisis Line: [phone]; 24 hour Emergency Shelter; Transitional Housing; Support Groups; Scientist, physiological; [employer]; Hispanic Outreach: [phone];  Visitation Center: [phone].  Agency Name: [facility] [place]. Address: [address], [place], [place] [address] Phone: [phone] Service(s) Offered: CAP Services; Home and [employer]; Individual or Group Supports; Respite Care Non-Institutional Nursing;  Residential Supports; Respite Care and Personal Care Services; Transportation; Family and Friends Night; Recreational Activities; Three Nutritious Meals/Snacks; Consultation with Registered Dietician; Twenty-four hour Registered Nurse Access; Daily and [employer]; [employer]aves; [place] for the [employer] (During Summer Months) Bingo Night (Every  Wednesday Night); Special Populations Dance Night  (Every Tuesday Night); Professional Hair Care Services.  Agency Name: [facility] Address: [address], [place], [place] [address] Phone: [phone] Contact Person: Jabier Mutton Website: [email or web address].Physicist, medical) Offered: Residential treatment facility for women; food and  clothing, educational & employment development and  transportation to work; Counsellor of financial skills;  parenting and family reunification; emotional and spiritual  support;  transitional housing for program graduates.  Agency Name: [facility] Address: [address] [place], [place] [address] Phone: [phone] Email: [email or web address] Website: [email or web address].es Service(s) Offered: Public housing units for elderly, disabled, and low income people; housing choice vouchers for income eligible  applicants; shelter plus care vouchers; and Psychologist, clinical.  Agency Name: [facility] [employer] Address: [address], [place], [place] [address] Phone: [phone] Email: [email or web address] Website: www.habitatalamance.org Service(s) Offered: Build houses for families in need of decent housing. Each adult in the family must invest 200 hours of labor on  someone else's house, work with volunteers to build their own house, attend classes  on budgeting, home maintenance, yard care, and attend homeowner association meetings.  Agency Name: [facility] Address: [address], [place], [place] [address] Phone: [phone] Website: www.rsli.org Service(s) Offered: Intermediate care facilities for intellectually delayed, Supervised Living in group homes for adults with developmental disabilities, Supervised Living for people who have dual diagnoses (MRMI), Independent Living, Supported Living, respite and a variety of CAP services, pre-vocational services, day supports, and [employer].  Agency Name: [place] [employer] [facility] Phone: [phone] Website: www.NCForeclosurePrevention.gov Service(s) Offered: Zero-interest, deferred loans to homeowners struggling to pay their mortgage. Call for more information.    Shelters Resource List  [employer] RESCUE MISSION PROVIDED BY: [facility] [address] [place], [place], [place] Offers a faith-based shelter for homeless men, usually with substance use disorders. Residents receive counseling, life skills training, and help finding  a job.  HOMELESS SHELTER PROVIDED BY: [facility] [address] [place], [place], [place] Offers a shelter for men, women, and families experiencing homelessness. Food, clothing and other items are available for residents. Also offers support and services to help residents become self-sufficient. Offers temporary emergency housing for 30 days. Additional shelter may be available when temperatures drop below freezing but is not guaranteed.  [facility]AMILY ABUSE SERVICES OF [facility] COUNTY PROVIDED BY: FAMILY ABUSE SERVICES OF [facility] [address], [place], [place] Offers services for victims of domestic violence. Offers a 24-hour crisis line and emergency shelter. Offers information and referrals to other community resources. Also offers court advocacy and support groups.  HOUSING CHOICE VOUCHER PROGRAM PROVIDED BY: [facility] [place] [address], [place], [place] Offers vouchers for approved Section 8 properties. Vouchers offer financial help with rent    FRUIT TREE MINISTRIES PROVIDED BY: [facility], [place], [place] Offers emergency shelter for victims of domestic violence. Also offers a 24-hour crisis hotline for victims of domestic violence, safety planning, information and referrals, case management, and support groups for victims of domestic violence.   ACT TOGETHER EMERGENCY SHELTER PROVIDED BY: [facility] [address], [place], [place] Offers a 21-day emergency shelter for youth experiencing a family crisis, abuse, or homelessness. Case management, supportive services, healthcare services, and more are available for residents. SHELTER PROVIDED BY: [facility] [address], [place], [place] Offers a homeless shelter for people and families. Meals, showers, community referrals, case management, and more are available for residents. HEARTH TRANSITIONAL LIVING PROGRAM PROVIDED BY: [facility] [address], [place], [place] Offers an 45-month homeless shelter for younger adults experiencing homelessness. Case management, independent living skills education, and more are available for residents. PARTNERSHIP VILLAGE PROVIDED BY: [place] [facility] [address], [place], [place] Offers transitional housing for families and single people experiencing homelessness. Residents meet regularly with a case manager to work towards self-sufficiency TRANSITIONAL HOUSING PROVIDED BY: [facility] [address], [place], [place] Offers transitional housing for male veterans with disabilities. Residents receive meals, transportation, and clothing. Also offers support groups, nutrition classes, and peer support to residents.   WEAVER HOUSE PROVIDED BY: [facility] [address], [place], [place] Offers shelter to adult men and women. Guests receive hot meals and case management. Also offers overnight shelter when temperatures drop during cold winter months  EMERGENCY FAMILY SHELTER PROVIDED BY: [facility] [address], ,  Offers shelter and support services for families experiencing homelessness.

## 2023-03-24 NOTE — Progress Notes (Addendum)
PROGRESS NOTE    [name]   ZOX:[id number] DOB: 02-19-1951  DOA: 03/23/2023 Date of Service: 03/24/23 PCP: [name], MD     Brief Narrative / Hospital Course:  [name] is a 72 year old male with hyperlipidemia, depression, hypertension, non-insulin-dependent diabetes mellitus, COPD, heart failure preserved ejection fraction, history of hepatitis C, adrenal insufficiency, current unhoused status, who presents to the emergency department for chief concerns of diarrhea, left leg redness. 07/27: admitted for sepsis d/t cellulitis  07/28: continuing IV abx await BCx   Consultants:  none  Procedures: none      ASSESSMENT & PLAN:   Principal Problem:   Left leg cellulitis Active Problems:   Hx of transient ischemic attack (TIA)   Liver cirrhosis (HCC)   COPD (chronic obstructive pulmonary disease) (HCC)   HTN (hypertension)   Hyperlipidemia, unspecified   Diabetes mellitus without complication (HCC)   Elevated troponin   History of hepatitis C   Hypokalemia   Depression   Adrenal insufficiency (HCC)   Leukocytosis   Serum total bilirubin elevated   Left leg pain   Left leg cellulitis Severe Sepsis d/t cellulitis  Patient meeting sepsis criteria on admission with increased respiration rate, leukocytosis, elevated lactic acid of 4.0, possible organ involvement of cardiac given elevated BNP and high sensitive troponin Suspect source is cellulitis in the left lower extremity Cefazolin IV fluids can d/c once taking adequate po Await blood cultures   Hypokalemia - resolved  Check magnesium level on admission --> WNL Replace as needed  Follow BMP   Leukocytosis - resolved Mild, secondary to cellulitis,  treat underlying cause Follow CBC   Anemia Mild Likely hemodilutional component Follow CBC  Adrenal insufficiency (HCC) Abnormal ACTH stimulation test Patient was started on hydrocortisone tablets during May 2024 admission Follow  outpatient   Liver cirrhosis (HCC) Patient is not taking his home meds in the last few days due to not feeling well home rifaximin, spironolactone, furosemide have been resumed on admission  Hyperlipidemia, unspecified Atorvastatin   HTN (hypertension) but BP on the low side  Home losartan 50 mg daily held, spironolactone 25 mg daily reduced to 12.5  Depression Citalopram 20 mg daily resumed, quetiapine 50 mg nightly resumed  COPD (chronic obstructive pulmonary disease) (HCC) Not in acute exacerbation Albuterol nebulizer every 6 hours as needed for wheezing and shortness of breath  Elevated troponin Suspect secondary to patient with history of heart failure preserved ejection fraction and has not been taking his home spironolactone, furosemide, ARB's as prescribed Recheck if chest pain   Diabetes mellitus without complication (HCC) Patient is not taking his home Jardiance and metformin, with nonfasting blood glucose 102 continue to monitor with insulin SSI and at bedtime coverage  Hx of transient ischemic attack (TIA) Patient has not been taken his home medication over the last week Resumed home aspirin 81 mg, atorvastatin 40 mg daily  Left leg pain Secondary to cellulitis oxycodone-acetaminophen , 5-325 mg, 1 tablet every 6 hours as needed for moderate pain, at oxycodone 5 mg immediate release every 6 hours as needed for severe pain and breakthrough pain, 1 day of coverage     DVT prophylaxis: lovenox  Pertinent IV fluids/nutrition: no continuous IV fluids  Central lines / invasive devices: none  Code Status: FULL CODE ACP documentation reviewed: 03/24/23 none on file   Current Admission Status: inpatient   TOC needs / Dispo plan: anticipate home but pt is unhoused, appreciate possibly getting shleter resources  Barriers to discharge /  significant pending items: clinical improvement              Subjective / Brief ROS:  Patient reports doing okay today but  still significant pain  Denies CP/SOB.  Pain LLE Denies new weakness.  Tolerating diet.  Reports no concerns w/ urination/defecation.   Family Communication: none at this time     Objective Findings:  Vitals:   03/23/23 1500 03/23/23 1712 03/24/23 0107 03/24/23 0907  BP: (!) 126/45 (!) 142/62 (!) 103/54 (!) 120/47  Pulse: (!) 57 (!) 55 (!) 51 (!) 55  Resp: 17 16 18 16   Temp: 99.2 F (37.3 C) 99.3 F (37.4 C) 97.9 F (36.6 C) 98.9 F (37.2 C)  TempSrc: Oral     SpO2: 100% 100% 100% 100%  Weight:      Height:        Intake/Output Summary (Last 24 hours) at 03/24/2023 1237 Last data filed at 03/24/2023 1056 Gross per 24 hour  Intake 580 ml  Output 150 ml  Net 430 ml   Filed Weights   03/23/23 1037  Weight: 111.1 kg    Examination:  Physical Exam Constitutional:      General: [name] is not in acute distress. Cardiovascular:     Rate and Rhythm: Normal rate and regular rhythm.  Pulmonary:     Effort: Pulmonary effort is normal.     Breath sounds: Normal breath sounds.  Abdominal:     General: Abdomen is flat.     Palpations: Abdomen is soft.  Musculoskeletal:     Right lower leg: No edema.     Left lower leg: Edema (mild) present.  Skin:    General: Skin is warm and dry.     Findings: Erythema present.       Neurological:     General: No focal deficit present.     Mental Status: [name] is alert and oriented to person, place, and time. Mental status is at baseline.  Psychiatric:        Mood and Affect: Mood normal.        Behavior: Behavior normal.          Scheduled Medications:   ascorbic acid  500 mg Oral Daily   aspirin EC  81 mg Oral Daily   atorvastatin  40 mg Oral QHS   citalopram  20 mg Oral Daily   enoxaparin (LOVENOX) injection  0.5 mg/kg Subcutaneous Q24H   feeding supplement  237 mL Oral BID BM   furosemide  40 mg Oral Daily   hydrocortisone  10 mg Oral Daily   insulin aspart  0-15 Units Subcutaneous TID WC   insulin aspart  0-5 Units  Subcutaneous QHS   multivitamin with minerals  1 tablet Oral Daily   pantoprazole  40 mg Oral QAC breakfast   QUEtiapine  50 mg Oral QHS   rifaximin  550 mg Oral BID   [START ON 03/25/2023] spironolactone  12.5 mg Oral Daily    Continuous Infusions:   ceFAZolin (ANCEF) IV      PRN Medications:  acetaminophen **OR** acetaminophen, albuterol, guaiFENesin, hydrALAZINE, melatonin, nitroGLYCERIN, ondansetron **OR** ondansetron (ZOFRAN) IV, oxyCODONE-acetaminophen **AND** oxyCODONE, senna-docusate  Antimicrobials from admission:  Anti-infectives (From admission, onward)    Start     Dose/Rate Route Frequency Ordered Stop   03/24/23 1400  ceFAZolin (ANCEF) IVPB 2g/100 mL premix        2 g 200 mL/hr over 30 Minutes Intravenous Every 8 hours 03/24/23 0913  03/24/23 1000  cefTRIAXone (ROCEPHIN) 2 g in sodium chloride 0.9 % 100 mL IVPB  Status:  Discontinued        2 g 200 mL/hr over 30 Minutes Intravenous Every 24 hours 03/23/23 1336 03/23/23 1337   03/23/23 2200  rifaximin (XIFAXAN) tablet 550 mg        550 mg Oral 2 times daily 03/23/23 1407     03/23/23 1200  cefTRIAXone (ROCEPHIN) 2 g in sodium chloride 0.9 % 100 mL IVPB  Status:  Discontinued        2 g 200 mL/hr over 30 Minutes Intravenous Every 24 hours 03/23/23 1156 03/24/23 0913           Data Reviewed:  I have personally reviewed the following...  CBC: Recent Labs  Lab 03/23/23 1046 03/24/23 0458  WBC 11.2* 5.2  NEUTROABS 10.0*  --   HGB 11.1* 9.5*  HCT 33.8* 29.6*  MCV 92.1 93.4  PLT 75* 61*   Basic Metabolic Panel: Recent Labs  Lab 03/23/23 1046 03/23/23 1048 03/24/23 0458  NA 136  --  136  K 3.2*  --  3.5  CL 104  --  108  CO2 21*  --  23  GLUCOSE 102*  --  81  BUN 14  --  18  CREATININE 1.15  --  1.03  CALCIUM 8.4*  --  7.8*  MG  --  1.8  --    GFR: Estimated Creatinine Clearance: 74.7 mL/min (by C-G formula based on SCr of 1.03 mg/dL). Liver Function Tests: Recent Labs  Lab  03/23/23 1046  AST 41  ALT 21  ALKPHOS 71  BILITOT 3.3*  PROT 6.9  ALBUMIN 2.6*   No results for input(s): "LIPASE", "AMYLASE" in the last 168 hours. No results for input(s): "AMMONIA" in the last 168 hours. Coagulation Profile: Recent Labs  Lab 03/23/23 1046  INR 1.4*   Cardiac Enzymes: No results for input(s): "CKTOTAL", "CKMB", "CKMBINDEX", "TROPONINI" in the last 168 hours. BNP (last 3 results) No results for input(s): "PROBNP" in the last 8760 hours. HbA1C: No results for input(s): "HGBA1C" in the last 72 hours. CBG: Recent Labs  Lab 03/23/23 1727 03/23/23 2147 03/24/23 0910 03/24/23 1127  GLUCAP 76 86 136* 75   Lipid Profile: No results for input(s): "CHOL", "HDL", "LDLCALC", "TRIG", "CHOLHDL", "LDLDIRECT" in the last 72 hours. Thyroid Function Tests: No results for input(s): "TSH", "T4TOTAL", "FREET4", "T3FREE", "THYROIDAB" in the last 72 hours. Anemia Panel: No results for input(s): "VITAMINB12", "FOLATE", "FERRITIN", "TIBC", "IRON", "RETICCTPCT" in the last 72 hours. Most Recent Urinalysis On File:     Component Value Date/Time   COLORURINE YELLOW (A) 03/12/2023 2012   APPEARANCEUR CLEAR (A) 03/12/2023 2012   LABSPEC 1.018 03/12/2023 2012   PHURINE 6.0 03/12/2023 2012   GLUCOSEU NEGATIVE 03/12/2023 2012   HGBUR NEGATIVE 03/12/2023 2012   BILIRUBINUR NEGATIVE 03/12/2023 2012   KETONESUR NEGATIVE 03/12/2023 2012   PROTEINUR 30 (A) 03/12/2023 2012   NITRITE NEGATIVE 03/12/2023 2012   LEUKOCYTESUR NEGATIVE 03/12/2023 2012   Sepsis Labs: @LABRCNTIP (procalcitonin:4,lacticidven:4) Microbiology: Recent Results (from the past 240 hour(s))  Blood Culture (routine x 2)     Status: None (Preliminary result)   Collection Time: 03/23/23 10:46 AM   Specimen: BLOOD  Result Value Ref Range Status   Specimen Description BLOOD LEFT ANTECUBITAL  Final   Special Requests   Final    BOTTLES DRAWN AEROBIC AND ANAEROBIC Blood Culture results may not be optimal due to  an inadequate  volume of blood received in culture bottles   Culture   Final    NO GROWTH < 24 HOURS Performed at [facility], [address] [place], [place] [address]    Report Status PENDING  Incomplete  Blood Culture (routine x 2)     Status: None (Preliminary result)   Collection Time: 03/23/23 10:46 AM   Specimen: BLOOD  Result Value Ref Range Status   Specimen Description BLOOD  RAC  Final   Special Requests   Final    BOTTLES DRAWN AEROBIC AND ANAEROBIC Blood Culture results may not be optimal due to an inadequate volume of blood received in culture bottles   Culture   Final    NO GROWTH < 24 HOURS Performed at [facility], [address] [place], [place] [address]    Report Status PENDING  Incomplete  SARS Coronavirus 2 by RT PCR (hospital order, performed in [facility] hospital lab) *cepheid single result test* Anterior Nasal Swab     Status: None   Collection Time: 03/23/23 10:48 AM   Specimen: Anterior Nasal Swab  Result Value Ref Range Status   SARS Coronavirus 2 by RT PCR NEGATIVE NEGATIVE Final    Comment: (NOTE) SARS-CoV-2 target nucleic acids are NOT DETECTED.  The SARS-CoV-2 RNA is generally detectable in upper and lower respiratory specimens during the acute phase of infection. The lowest concentration of SARS-CoV-2 viral copies this assay can detect is 250 copies / mL. A negative result does not preclude SARS-CoV-2 infection and should not be used as the sole basis for treatment or other patient management decisions.  A negative result may occur with improper specimen collection / handling, submission of specimen other than nasopharyngeal swab, presence of viral mutation(s) within the areas targeted by this assay, and inadequate number of viral copies (<250 copies / mL). A negative result must be combined with clinical observations, patient history, and epidemiological information.  Fact Sheet for Patients:    RoadLapTop.co.za  Fact Sheet for Healthcare Providers: http://kim-miller.com/  This test is not yet approved or  cleared by the [place] FDA and has been authorized for detection and/or diagnosis of SARS-CoV-2 by FDA under an Emergency Use Authorization (EUA).  This EUA will remain in effect (meaning this test can be used) for the duration of the COVID-19 declaration under Section 564(b)(1) of the Act, 21 U.S.C. section 360bbb-3(b)(1), unless the authorization is terminated or revoked sooner.  Performed at [facility], [address], [place], [place] [address]       Radiology Studies last 3 days: CT Angio Chest PE W/Cm &/Or Wo Cm  Result Date: 03/23/2023 CLINICAL DATA:  Positive D-dimer. Left lower extremity swelling and pain. EXAM: CT ANGIOGRAPHY CHEST WITH CONTRAST TECHNIQUE: Multidetector CT imaging of the chest was performed using the standard protocol during bolus administration of intravenous contrast. Multiplanar CT image reconstructions and MIPs were obtained to evaluate the vascular anatomy. RADIATION DOSE REDUCTION: This exam was performed according to the departmental dose-optimization program which includes automated exposure control, adjustment of the mA and/or kV according to patient size and/or use of iterative reconstruction technique. CONTRAST:  75mL OMNIPAQUE IOHEXOL 350 MG/ML SOLN COMPARISON:  Jan 08, 2023. FINDINGS: Cardiovascular: Satisfactory opacification of the pulmonary arteries to the segmental level. No evidence of pulmonary embolism. Mild cardiomegaly. Enlarged pulmonary artery suggesting pulmonary hypertension. No pericardial effusion. Mediastinum/Nodes: No enlarged mediastinal, hilar, or axillary lymph nodes. Thyroid gland, trachea, and esophagus demonstrate no significant findings. Lungs/Pleura: Lungs are clear.  No pleural effusion or pneumothorax. Upper Abdomen: Hepatic cirrhosis. Musculoskeletal:  Stable old sternal fracture. No acute osseous abnormality is noted. Review of the MIP images confirms the above findings. IMPRESSION: No definite evidence of pulmonary embolus. Stable other chronic findings as described above. Electronically Signed   By: Lupita Raider M.D.   On: 03/23/2023 12:47   US Venous Img Lower Unilateral Left  Result Date: 03/23/2023 CLINICAL DATA:  Left lower extremity edema EXAM: LEFT LOWER EXTREMITY VENOUS DOPPLER ULTRASOUND TECHNIQUE: Gray-scale sonography with compression, as well as color and duplex ultrasound, were performed to evaluate the deep venous system(s) from the level of the common femoral vein through the popliteal and proximal calf veins. COMPARISON:  None Available. FINDINGS: VENOUS Normal compressibility of the common femoral, superficial femoral, and popliteal veins, as well as the visualized calf veins. Visualized portions of profunda femoral vein and great saphenous vein unremarkable. No filling defects to suggest DVT on grayscale or color Doppler imaging. Doppler waveforms show normal direction of venous flow, normal respiratory plasticity and response to augmentation. Limited views of the contralateral common femoral vein are unremarkable. OTHER None. Limitations: none IMPRESSION: Negative. Electronically Signed   By: Malachy Moan M.D.   On: 03/23/2023 11:55   DG Chest Port 1 View  Result Date: 03/23/2023 CLINICAL DATA:  Provided history: Questionable sepsis-evaluate for abnormality. EXAM: PORTABLE CHEST 1 VIEW COMPARISON:  Prior chest radiographs 01/12/2023 and earlier. Chest CT 01/08/2023 FINDINGS: Heart size at the upper limits of normal. Aortic atherosclerosis. Prominence of the main pulmonary artery contour. No appreciable airspace consolidation or pulmonary edema. No evidence of pleural effusion or pneumothorax. No acute osseous abnormality identified. IMPRESSION: 1. No evidence of an acute cardiopulmonary abnormality. 2. Redemonstrated prominence  of the main pulmonary artery contour, which suggest pulmonary arterial hypertension. 3. Aortic Atherosclerosis (ICD10-I70.0). Electronically Signed   By: Jackey Loge D.O.   On: 03/23/2023 11:31             LOS: 1 day    Sunnie Nielsen, DO [facility] 03/24/2023, 12:37 PM    Dictation software may have been used to generate the above note. Typos may occur and escape review in typed/dictated notes. Please contact Dr Lyn Hollingshead directly for clarity if needed.  Staff may message me via secure chat in Epic  but this may not receive an immediate response,  please page me for urgent matters!  If 7PM-7AM, please contact night coverage [email or web address]

## 2023-03-24 NOTE — TOC CM/SW Note (Signed)
Transition of Care [facility]) - Inpatient Brief Assessment   Patient Details  Name: [name] MRN: [id number] Date of Birth: 11-Jan-1951  Transition of [facility]) CM/SW Contact:    Kemper Durie, RN Phone Number: 03/24/2023, 3:28 PM   Clinical Narrative:  Patient is homeless, will need housing resources and help with placement in shelter.   Transition of Care Asessment: Insurance and Status: Insurance coverage has been reviewed Patient has primary care physician: Yes Home environment has been reviewed: Homeless Prior level of function:: Independent Prior/Current Home Services: No current home services Social Determinants of Health Reivew: SDOH reviewed needs interventions Readmission risk has been reviewed: Yes Transition of care needs: transition of care needs identified, TOC will continue to follow

## 2023-03-24 NOTE — Plan of Care (Signed)
  Problem: Clinical Measurements: Goal: Diagnostic test results will improve Outcome: Progressing   Problem: Clinical Measurements: Goal: Signs and symptoms of infection will decrease Outcome: Progressing   Problem: Respiratory: Goal: Ability to maintain adequate ventilation will improve Outcome: Progressing   Problem: Coping: Goal: Ability to adjust to condition or change in health will improve Outcome: Progressing

## 2023-03-25 DIAGNOSIS — L03116 Cellulitis of left lower limb: Secondary | ICD-10-CM | POA: Diagnosis not present

## 2023-03-25 LAB — GLUCOSE, CAPILLARY
Glucose-Capillary: 98 mg/dL (ref 70–99)
Glucose-Capillary: 117 mg/dL — ABNORMAL HIGH (ref 70–99)
Glucose-Capillary: 116 mg/dL — ABNORMAL HIGH (ref 70–99)
Glucose-Capillary: 95 mg/dL (ref 70–99)

## 2023-03-25 MED ORDER — FUROSEMIDE 10 MG/ML IJ SOLN
40.0000 mg | Freq: Once | INTRAMUSCULAR | Status: AC
  Administered 2023-03-25: 40 mg via INTRAVENOUS
  Filled 2023-03-25: qty 4

## 2023-03-25 NOTE — Plan of Care (Signed)
  Problem: Clinical Measurements: Goal: Diagnostic test results will improve Outcome: Progressing Goal: Signs and symptoms of infection will decrease Outcome: Progressing   Problem: Respiratory: Goal: Ability to maintain adequate ventilation will improve Outcome: Progressing   Problem: Coping: Goal: Ability to adjust to condition or change in health will improve Outcome: Progressing   Problem: Fluid Volume: Goal: Ability to maintain a balanced intake and output will improve Outcome: Progressing   Problem: Safety: Goal: Ability to remain free from injury will improve Outcome: Progressing   Problem: Skin Integrity: Goal: Risk for impaired skin integrity will decrease Outcome: Progressing

## 2023-03-25 NOTE — Progress Notes (Signed)
PROGRESS NOTE    [name]   JJO:[id number] DOB: May 26, 1951  DOA: 03/23/2023 Date of Service: 03/25/23 PCP: [name], MD     Brief Narrative / Hospital Course:  [name] is a 72 year old male with hyperlipidemia, depression, hypertension, non-insulin-dependent diabetes mellitus, COPD, heart failure preserved ejection fraction, history of hepatitis C, adrenal insufficiency, current unhoused status, who presents to the emergency department for chief concerns of diarrhea, left leg redness. 07/27: admitted for sepsis d/t cellulitis  07/28: continuing IV abx await BCx  07/29: leg still significant erythema, tenderness. BCx neg. Lasix IVx1, elevate legs.   Consultants:  none  Procedures: none      ASSESSMENT & PLAN:   Principal Problem:   Left leg cellulitis Active Problems:   Hx of transient ischemic attack (TIA)   Liver cirrhosis (HCC)   COPD (chronic obstructive pulmonary disease) (HCC)   HTN (hypertension)   Hyperlipidemia, unspecified   Diabetes mellitus without complication (HCC)   Elevated troponin   History of hepatitis C   Hypokalemia   Depression   Adrenal insufficiency (HCC)   Leukocytosis   Serum total bilirubin elevated   Left leg pain   Left leg cellulitis Severe Sepsis d/t cellulitis - sepsis resolved  Patient meeting sepsis criteria on admission with increased respiration rate, leukocytosis, elevated lactic acid of 4.0, possible organ involvement of cardiac given elevated BNP and high sensitive troponin Suspect source is cellulitis in the left lower extremity Cefazolin IV fluids d/c  Elevate legs Lasix IV x1 in addition to his usual po dose  Pain control   Hypokalemia - resolved  Check magnesium level on admission --> WNL Replace as needed  Follow BMP   Leukocytosis - resolved Mild, secondary to cellulitis,  treat underlying cause Follow CBC   Anemia Mild Likely hemodilutional component Follow CBC  Adrenal  insufficiency (HCC) Abnormal ACTH stimulation test Patient was started on hydrocortisone tablets during May 2024 admission Follow outpatient   Liver cirrhosis (HCC) Patient is not taking his home meds in the last few days due to not feeling well home rifaximin, spironolactone, furosemide have been resumed on admission  Hyperlipidemia, unspecified Atorvastatin   HTN (hypertension) but BP on the low side  Home losartan 50 mg daily held, spironolactone 25 mg daily reduced to 12.5  Depression Citalopram 20 mg daily resumed, quetiapine 50 mg nightly resumed  COPD (chronic obstructive pulmonary disease) (HCC) Not in acute exacerbation Albuterol nebulizer every 6 hours as needed for wheezing and shortness of breath  Elevated troponin Suspect secondary to patient with history of heart failure preserved ejection fraction and has not been taking his home spironolactone, furosemide, ARB's as prescribed Recheck if chest pain   Diabetes mellitus without complication (HCC) Patient is not taking his home Jardiance and metformin, with nonfasting blood glucose 102 continue to monitor with insulin SSI and at bedtime coverage  Hx of transient ischemic attack (TIA) Patient has not been taken his home medication over the last week Resumed home aspirin 81 mg, atorvastatin 40 mg daily     DVT prophylaxis: lovenox  Pertinent IV fluids/nutrition: no continuous IV fluids  Central lines / invasive devices: none  Code Status: FULL CODE ACP documentation reviewed: 03/24/23 none on file   Current Admission Status: inpatient   TOC needs / Dispo plan: anticipate home but pt is unhoused, appreciate possibly getting shleter resources  Barriers to discharge / significant pending items: clinical improvement  Subjective / Brief ROS:  Patient reports still significant pain and stable edema  Denies CP/SOB.  Pain LLE worst, also in RLE  Not ambulating much Denies new weakness.   Tolerating diet.  Reports no concerns w/ urination/defecation.   Family Communication: none at this time     Objective Findings:  Vitals:   03/24/23 0107 03/24/23 0907 03/24/23 1521 03/25/23 0743  BP: (!) 103/54 (!) 120/47 137/69 (!) 107/55  Pulse: (!) 51 (!) 55 (!) 59 (!) 50  Resp: 18 16 18 14   Temp: 97.9 F (36.6 C) 98.9 F (37.2 C)  97.8 F (36.6 C)  TempSrc:      SpO2: 100% 100% 99% 98%  Weight:      Height:        Intake/Output Summary (Last 24 hours) at 03/25/2023 1247 Last data filed at 03/25/2023 1610 Gross per 24 hour  Intake 960 ml  Output 1600 ml  Net -640 ml   Filed Weights   03/23/23 1037  Weight: 111.1 kg    Examination:  Physical Exam Constitutional:      General: He is not in acute distress. Cardiovascular:     Rate and Rhythm: Normal rate and regular rhythm.  Pulmonary:     Effort: Pulmonary effort is normal.     Breath sounds: Normal breath sounds.  Abdominal:     General: Abdomen is flat.     Palpations: Abdomen is soft.  Musculoskeletal:     Right lower leg: No edema.     Left lower leg: Edema (mild) present.  Skin:    General: Skin is warm and dry.     Findings: Erythema present.       Neurological:     General: No focal deficit present.     Mental Status: He is alert and oriented to person, place, and time. Mental status is at baseline.  Psychiatric:        Mood and Affect: Mood normal.        Behavior: Behavior normal.          Scheduled Medications:   ascorbic acid  500 mg Oral Daily   aspirin EC  81 mg Oral Daily   atorvastatin  40 mg Oral QHS   citalopram  20 mg Oral Daily   enoxaparin (LOVENOX) injection  0.5 mg/kg Subcutaneous Q24H   feeding supplement  237 mL Oral BID BM   furosemide  40 mg Intravenous Once   furosemide  40 mg Oral Daily   hydrocortisone  10 mg Oral Daily   insulin aspart  0-15 Units Subcutaneous TID WC   insulin aspart  0-5 Units Subcutaneous QHS   multivitamin with minerals  1 tablet Oral  Daily   pantoprazole  40 mg Oral QAC breakfast   QUEtiapine  50 mg Oral QHS   rifaximin  550 mg Oral BID   spironolactone  12.5 mg Oral Daily    Continuous Infusions:   ceFAZolin (ANCEF) IV 2 g (03/25/23 0519)    PRN Medications:  albuterol, guaiFENesin, hydrALAZINE, ibuprofen, melatonin, nitroGLYCERIN, ondansetron **OR** ondansetron (ZOFRAN) IV, [EXPIRED] oxyCODONE-acetaminophen **AND** oxyCODONE, senna-docusate  Antimicrobials from admission:  Anti-infectives (From admission, onward)    Start     Dose/Rate Route Frequency Ordered Stop   03/24/23 1400  ceFAZolin (ANCEF) IVPB 2g/100 mL premix        2 g 200 mL/hr over 30 Minutes Intravenous Every 8 hours 03/24/23 0913     03/24/23 1000  cefTRIAXone (ROCEPHIN) 2 g in sodium  chloride 0.9 % 100 mL IVPB  Status:  Discontinued        2 g 200 mL/hr over 30 Minutes Intravenous Every 24 hours 03/23/23 1336 03/23/23 1337   03/23/23 2200  rifaximin (XIFAXAN) tablet 550 mg        550 mg Oral 2 times daily 03/23/23 1407     03/23/23 1200  cefTRIAXone (ROCEPHIN) 2 g in sodium chloride 0.9 % 100 mL IVPB  Status:  Discontinued        2 g 200 mL/hr over 30 Minutes Intravenous Every 24 hours 03/23/23 1156 03/24/23 0913           Data Reviewed:  I have personally reviewed the following...  CBC: Recent Labs  Lab 03/23/23 1046 03/24/23 0458  WBC 11.2* 5.2  NEUTROABS 10.0*  --   HGB 11.1* 9.5*  HCT 33.8* 29.6*  MCV 92.1 93.4  PLT 75* 61*   Basic Metabolic Panel: Recent Labs  Lab 03/23/23 1046 03/23/23 1048 03/24/23 0458  NA 136  --  136  K 3.2*  --  3.5  CL 104  --  108  CO2 21*  --  23  GLUCOSE 102*  --  81  BUN 14  --  18  CREATININE 1.15  --  1.03  CALCIUM 8.4*  --  7.8*  MG  --  1.8  --    GFR: Estimated Creatinine Clearance: 74.7 mL/min (by C-G formula based on SCr of 1.03 mg/dL). Liver Function Tests: Recent Labs  Lab 03/23/23 1046  AST 41  ALT 21  ALKPHOS 71  BILITOT 3.3*  PROT 6.9  ALBUMIN 2.6*   No  results for input(s): "LIPASE", "AMYLASE" in the last 168 hours. No results for input(s): "AMMONIA" in the last 168 hours. Coagulation Profile: Recent Labs  Lab 03/23/23 1046  INR 1.4*   Cardiac Enzymes: No results for input(s): "CKTOTAL", "CKMB", "CKMBINDEX", "TROPONINI" in the last 168 hours. BNP (last 3 results) No results for input(s): "PROBNP" in the last 8760 hours. HbA1C: No results for input(s): "HGBA1C" in the last 72 hours. CBG: Recent Labs  Lab 03/24/23 1127 03/24/23 1726 03/24/23 2134 03/25/23 0758 03/25/23 1145  GLUCAP 75 116* 85 98 117*   Lipid Profile: No results for input(s): "CHOL", "HDL", "LDLCALC", "TRIG", "CHOLHDL", "LDLDIRECT" in the last 72 hours. Thyroid Function Tests: No results for input(s): "TSH", "T4TOTAL", "FREET4", "T3FREE", "THYROIDAB" in the last 72 hours. Anemia Panel: No results for input(s): "VITAMINB12", "FOLATE", "FERRITIN", "TIBC", "IRON", "RETICCTPCT" in the last 72 hours. Most Recent Urinalysis On File:     Component Value Date/Time   COLORURINE YELLOW (A) 03/12/2023 2012   APPEARANCEUR CLEAR (A) 03/12/2023 2012   LABSPEC 1.018 03/12/2023 2012   PHURINE 6.0 03/12/2023 2012   GLUCOSEU NEGATIVE 03/12/2023 2012   HGBUR NEGATIVE 03/12/2023 2012   BILIRUBINUR NEGATIVE 03/12/2023 2012   KETONESUR NEGATIVE 03/12/2023 2012   PROTEINUR 30 (A) 03/12/2023 2012   NITRITE NEGATIVE 03/12/2023 2012   LEUKOCYTESUR NEGATIVE 03/12/2023 2012   Sepsis Labs: @LABRCNTIP (procalcitonin:4,lacticidven:4) Microbiology: Recent Results (from the past 240 hour(s))  Blood Culture (routine x 2)     Status: None (Preliminary result)   Collection Time: 03/23/23 10:46 AM   Specimen: BLOOD  Result Value Ref Range Status   Specimen Description BLOOD LEFT ANTECUBITAL  Final   Special Requests   Final    BOTTLES DRAWN AEROBIC AND ANAEROBIC Blood Culture results may not be optimal due to an inadequate volume of blood received in culture bottles  Culture    Final    NO GROWTH 2 DAYS Performed at [facility], [address] [place], [place] [address]    Report Status PENDING  Incomplete  Blood Culture (routine x 2)     Status: None (Preliminary result)   Collection Time: 03/23/23 10:46 AM   Specimen: BLOOD  Result Value Ref Range Status   Specimen Description BLOOD  RAC  Final   Special Requests   Final    BOTTLES DRAWN AEROBIC AND ANAEROBIC Blood Culture results may not be optimal due to an inadequate volume of blood received in culture bottles   Culture   Final    NO GROWTH 2 DAYS Performed at [facility], [address] [place], [place] [address]    Report Status PENDING  Incomplete  SARS Coronavirus 2 by RT PCR (hospital order, performed in [facility] hospital lab) *cepheid single result test* Anterior Nasal Swab     Status: None   Collection Time: 03/23/23 10:48 AM   Specimen: Anterior Nasal Swab  Result Value Ref Range Status   SARS Coronavirus 2 by RT PCR NEGATIVE NEGATIVE Final    Comment: (NOTE) SARS-CoV-2 target nucleic acids are NOT DETECTED.  The SARS-CoV-2 RNA is generally detectable in upper and lower respiratory specimens during the acute phase of infection. The lowest concentration of SARS-CoV-2 viral copies this assay can detect is 250 copies / mL. A negative result does not preclude SARS-CoV-2 infection and should not be used as the sole basis for treatment or other patient management decisions.  A negative result may occur with improper specimen collection / handling, submission of specimen other than nasopharyngeal swab, presence of viral mutation(s) within the areas targeted by this assay, and inadequate number of viral copies (<250 copies / mL). A negative result must be combined with clinical observations, patient history, and epidemiological information.  Fact Sheet for Patients:   RoadLapTop.co.za  Fact Sheet for Healthcare  Providers: http://kim-miller.com/  This test is not yet approved or  cleared by the [place] FDA and has been authorized for detection and/or diagnosis of SARS-CoV-2 by FDA under an Emergency Use Authorization (EUA).  This EUA will remain in effect (meaning this test can be used) for the duration of the COVID-19 declaration under Section 564(b)(1) of the Act, 21 U.S.C. section 360bbb-3(b)(1), unless the authorization is terminated or revoked sooner.  Performed at [facility], [address] [place], [place] [address]       Radiology Studies last 3 days: CT Angio Chest PE W/Cm &/Or Wo Cm  Result Date: 03/23/2023 CLINICAL DATA:  Positive D-dimer. Left lower extremity swelling and pain. EXAM: CT ANGIOGRAPHY CHEST WITH CONTRAST TECHNIQUE: Multidetector CT imaging of the chest was performed using the standard protocol during bolus administration of intravenous contrast. Multiplanar CT image reconstructions and MIPs were obtained to evaluate the vascular anatomy. RADIATION DOSE REDUCTION: This exam was performed according to the departmental dose-optimization program which includes automated exposure control, adjustment of the mA and/or kV according to patient size and/or use of iterative reconstruction technique. CONTRAST:  75mL OMNIPAQUE IOHEXOL 350 MG/ML SOLN COMPARISON:  Jan 08, 2023. FINDINGS: Cardiovascular: Satisfactory opacification of the pulmonary arteries to the segmental level. No evidence of pulmonary embolism. Mild cardiomegaly. Enlarged pulmonary artery suggesting pulmonary hypertension. No pericardial effusion. Mediastinum/Nodes: No enlarged mediastinal, hilar, or axillary lymph nodes. Thyroid gland, trachea, and esophagus demonstrate no significant findings. Lungs/Pleura: Lungs are clear. No pleural effusion or pneumothorax. Upper Abdomen: Hepatic cirrhosis. Musculoskeletal:  Stable old sternal fracture. No acute osseous abnormality is noted. Review  of the MIP images confirms the above findings. IMPRESSION: No definite evidence of pulmonary embolus. Stable other chronic findings as described above. Electronically Signed   By: Lupita Raider M.D.   On: 03/23/2023 12:47   US Venous Img Lower Unilateral Left  Result Date: 03/23/2023 CLINICAL DATA:  Left lower extremity edema EXAM: LEFT LOWER EXTREMITY VENOUS DOPPLER ULTRASOUND TECHNIQUE: Gray-scale sonography with compression, as well as color and duplex ultrasound, were performed to evaluate the deep venous system(s) from the level of the common femoral vein through the popliteal and proximal calf veins. COMPARISON:  None Available. FINDINGS: VENOUS Normal compressibility of the common femoral, superficial femoral, and popliteal veins, as well as the visualized calf veins. Visualized portions of profunda femoral vein and great saphenous vein unremarkable. No filling defects to suggest DVT on grayscale or color Doppler imaging. Doppler waveforms show normal direction of venous flow, normal respiratory plasticity and response to augmentation. Limited views of the contralateral common femoral vein are unremarkable. OTHER None. Limitations: none IMPRESSION: Negative. Electronically Signed   By: Malachy Moan M.D.   On: 03/23/2023 11:55   DG Chest Port 1 View  Result Date: 03/23/2023 CLINICAL DATA:  Provided history: Questionable sepsis-evaluate for abnormality. EXAM: PORTABLE CHEST 1 VIEW COMPARISON:  Prior chest radiographs 01/12/2023 and earlier. Chest CT 01/08/2023 FINDINGS: Heart size at the upper limits of normal. Aortic atherosclerosis. Prominence of the main pulmonary artery contour. No appreciable airspace consolidation or pulmonary edema. No evidence of pleural effusion or pneumothorax. No acute osseous abnormality identified. IMPRESSION: 1. No evidence of an acute cardiopulmonary abnormality. 2. Redemonstrated prominence of the main pulmonary artery contour, which suggest pulmonary arterial  hypertension. 3. Aortic Atherosclerosis (ICD10-I70.0). Electronically Signed   By: Jackey Loge D.O.   On: 03/23/2023 11:31             LOS: 2 days    Sunnie Nielsen, DO [facility] 03/25/2023, 12:47 PM    Dictation software may have been used to generate the above note. Typos may occur and escape review in typed/dictated notes. Please contact Dr Lyn Hollingshead directly for clarity if needed.  Staff may message me via secure chat in Epic  but this may not receive an immediate response,  please page me for urgent matters!  If 7PM-7AM, please contact night coverage [email or web address]

## 2023-03-25 NOTE — Plan of Care (Signed)
  Problem: Fluid Volume: Goal: Hemodynamic stability will improve Outcome: Progressing   Problem: Clinical Measurements: Goal: Diagnostic test results will improve Outcome: Progressing Goal: Signs and symptoms of infection will decrease Outcome: Progressing   Problem: Respiratory: Goal: Ability to maintain adequate ventilation will improve Outcome: Progressing   Problem: Education: Goal: Ability to describe self-care measures that may prevent or decrease complications (Diabetes Survival Skills Education) will improve Outcome: Progressing Goal: Individualized Educational Video(s) Outcome: Progressing   Problem: Coping: Goal: Ability to adjust to condition or change in health will improve Outcome: Progressing   Problem: Fluid Volume: Goal: Ability to maintain a balanced intake and output will improve Outcome: Progressing   Problem: Health Behavior/Discharge Planning: Goal: Ability to identify and utilize available resources and services will improve Outcome: Progressing Goal: Ability to manage health-related needs will improve Outcome: Progressing   Problem: Metabolic: Goal: Ability to maintain appropriate glucose levels will improve Outcome: Progressing   Problem: Nutritional: Goal: Maintenance of adequate nutrition will improve Outcome: Progressing Goal: Progress toward achieving an optimal weight will improve Outcome: Progressing   Problem: Skin Integrity: Goal: Risk for impaired skin integrity will decrease Outcome: Progressing   Problem: Tissue Perfusion: Goal: Adequacy of tissue perfusion will improve Outcome: Progressing   Problem: Education: Goal: Knowledge of General Education information will improve Description: Including pain rating scale, medication(s)/side effects and non-pharmacologic comfort measures Outcome: Progressing   Problem: Health Behavior/Discharge Planning: Goal: Ability to manage health-related needs will improve Outcome:  Progressing   Problem: Clinical Measurements: Goal: Ability to maintain clinical measurements within normal limits will improve Outcome: Progressing Goal: Will remain free from infection Outcome: Progressing Goal: Diagnostic test results will improve Outcome: Progressing Goal: Respiratory complications will improve Outcome: Progressing Goal: Cardiovascular complication will be avoided Outcome: Progressing   Problem: Activity: Goal: Risk for activity intolerance will decrease Outcome: Progressing   Problem: Nutrition: Goal: Adequate nutrition will be maintained Outcome: Progressing   Problem: Coping: Goal: Level of anxiety will decrease Outcome: Progressing   Problem: Elimination: Goal: Will not experience complications related to bowel motility Outcome: Progressing Goal: Will not experience complications related to urinary retention Outcome: Progressing   Problem: Safety: Goal: Ability to remain free from injury will improve Outcome: Progressing   Problem: Skin Integrity: Goal: Risk for impaired skin integrity will decrease Outcome: Progressing

## 2023-03-25 NOTE — Progress Notes (Signed)
   03/25/23 1100  Spiritual Encounters  Type of Visit Initial  Care provided to: Patient  Referral source Patient request  Reason for visit Routine spiritual support  OnCall Visit No  Spiritual Framework  Presenting Themes Meaning/purpose/sources of inspiration  Patient Stress Factors Health changes  Interventions  Spiritual Care Interventions Made Established relationship of care and support;Compassionate presence;Reflective listening;Prayer;Encouragement  Intervention Outcomes  Outcomes Connection to spiritual care;Awareness of health;Awareness of support  Spiritual Care Plan  Spiritual Care Issues Still Outstanding Chaplain will continue to follow   Chaplain responded to prayer request and provided compassionate care.  Chaplain spiritual support services remain available as the need arises. Chaplain will follow up with a second visit and prayer on tomorrow, per patient request.

## 2023-03-25 NOTE — Progress Notes (Signed)
Initial Nutrition Assessment  DOCUMENTATION CODES:   Obesity unspecified  INTERVENTION:   -MVI with minerals daily -Continue Ensure Enlive po BID, each supplement provides 350 kcal and 20 grams of protein -Liberalize diet to 2 gram sodium for wider variety of meal selections  NUTRITION DIAGNOSIS:   Increased nutrient needs related to chronic illness as evidenced by estimated needs.  GOAL:   Patient will meet greater than or equal to 90% of their needs  MONITOR:   PO intake  REASON FOR ASSESSMENT:   Malnutrition Screening Tool    ASSESSMENT:   Pt with hyperlipidemia, depression, hypertension, non-insulin-dependent diabetes mellitus, COPD, heart failure preserved ejection fraction, history of hepatitis C, adrenal insufficiency, current unhoused status, who presents for chief concerns of diarrhea, left leg redness.  Pt admitted with lt leg cellulitis.  Reviewed I/O's: -510 ml x 24 hours  Pt in with chaplain at time of visit.    Pt currently on a heart healthy diet. No meal completion data available to assess at this time.   Pt is homeless, but able to obtain and afford medications through insurance.   Reviewed wt hx; no wt loss noted. Suspect some wt gain may be related to edema.   Per TOC notes, plan to discharge once pt finished IV antibiotics.   Medications reviewed and include lasix and aldactone.   Lab Results  Component Value Date   HGBA1C 5.1 12/24/2022   PTA DM medications are 500 mg metformin BID and 10 mg empaglflozin daily.   Labs reviewed: CBGS: 98-117 (inpatient orders for glycemic control are 0-15 units insulin aspart TID with meals and 0-5 units insulin aspart daily at bedtime).    NUTRITION - FOCUSED PHYSICAL EXAM:  Flowsheet Row Most Recent Value  Orbital Region No depletion  Upper Arm Region No depletion  Thoracic and Lumbar Region No depletion  Buccal Region No depletion  Temple Region No depletion  Clavicle Bone Region No depletion   Clavicle and Acromion Bone Region No depletion  Scapular Bone Region No depletion  Dorsal Hand No depletion  Patellar Region No depletion  Anterior Thigh Region No depletion  Posterior Calf Region No depletion  Edema (RD Assessment) Mild  Hair Reviewed  Eyes Reviewed  Mouth Reviewed  Skin Reviewed  Nails Reviewed       Diet Order:   Diet Order             Diet 2 gram sodium Fluid consistency: Thin  Diet effective now                   EDUCATION NEEDS:   Education needs have been addressed  Skin:  Skin Assessment: Reviewed RN Assessment  Last BM:  03/24/23  Height:   Ht Readings from Last 1 Encounters:  03/23/23 5\' 6"  (1.676 m)    Weight:   Wt Readings from Last 1 Encounters:  03/23/23 111.1 kg    Ideal Body Weight:  64.5 kg  BMI:  Body mass index is 39.53 kg/m.  Estimated Nutritional Needs:   Kcal:  1750-1950  Protein:  85-100 grams  Fluid:  > 1.7 L    Levada Schilling, RD, LDN, CDCES Registered Dietitian II Certified Diabetes Care and Education Specialist Please refer to [facility] for RD and/or RD on-call/weekend/after hours pager

## 2023-03-25 NOTE — TOC Progression Note (Addendum)
Transition of Care [facility]) - Progression Note    Patient Details  Name: [name] MRN: [id number] Date of Birth: 1951-03-09  Transition of Care [facility]) CM/SW Contact  Marlowe Sax, RN Phone Number: 03/25/2023, 12:26 PM  Clinical Narrative:      Will need cab voucher to [place] on [address] when discharged. He stated he is unable to go to shelter due to previous charges    He stated that he will need a pari of pants and shirt when he discharges [employer] pants and x large shirt Continues to be on IV ABX Will dc after they are finished    Expected Discharge Plan and Services                                               Social Determinants of Health (SDOH) Interventions SDOH Screenings   Food Insecurity: Food Insecurity Present (03/25/2023)  Housing: High Risk (03/25/2023)  Transportation Needs: Unmet Transportation Needs (03/25/2023)  Utilities: At Risk (03/25/2023)  Alcohol Screen: Low Risk  (09/24/2022)  Depression (PHQ2-9): High Risk (04/26/2022)  Financial Resource Strain: High Risk (04/27/2022)  Physical Activity: Sufficiently Active (04/16/2018)  Social Connections: Moderately Isolated (04/30/2018)  Stress: Stress Concern Present (04/16/2018)  Tobacco Use: Medium Risk (03/23/2023)    Readmission Risk Interventions    11/07/2022    1:45 PM  Readmission Risk Prevention Plan  Transportation Screening Complete  SW Recovery Care/Counseling Consult Complete  Palliative Care Screening Not Applicable  Skilled Nursing Facility Not Applicable

## 2023-03-26 DIAGNOSIS — L03116 Cellulitis of left lower limb: Secondary | ICD-10-CM | POA: Diagnosis not present

## 2023-03-26 LAB — CBC WITH DIFFERENTIAL/PLATELET
Abs Immature Granulocytes: 0.03 10*3/uL (ref 0.00–0.07)
Basophils Absolute: 0 10*3/uL (ref 0.0–0.1)
Basophils Relative: 0 %
Eosinophils Absolute: 0 10*3/uL (ref 0.0–0.5)
Eosinophils Relative: 0 %
HCT: 31 % — ABNORMAL LOW (ref 39.0–52.0)
Hemoglobin: 10.3 g/dL — ABNORMAL LOW (ref 13.0–17.0)
Immature Granulocytes: 0 %
Lymphocytes Relative: 3 %
Lymphs Abs: 0.3 10*3/uL — ABNORMAL LOW (ref 0.7–4.0)
MCH: 30.2 pg (ref 26.0–34.0)
MCHC: 33.2 g/dL (ref 30.0–36.0)
MCV: 90.9 fL (ref 80.0–100.0)
Monocytes Absolute: 0.5 10*3/uL (ref 0.1–1.0)
Monocytes Relative: 6 %
Neutro Abs: 7.7 10*3/uL (ref 1.7–7.7)
Neutrophils Relative %: 91 %
Platelets: 82 10*3/uL — ABNORMAL LOW (ref 150–400)
RBC: 3.41 MIL/uL — ABNORMAL LOW (ref 4.22–5.81)
RDW: 16.2 % — ABNORMAL HIGH (ref 11.5–15.5)
WBC: 8.5 10*3/uL (ref 4.0–10.5)
nRBC: 0 % (ref 0.0–0.2)

## 2023-03-26 LAB — BASIC METABOLIC PANEL WITH GFR
Anion gap: 8 (ref 5–15)
BUN: 27 mg/dL — ABNORMAL HIGH (ref 8–23)
CO2: 26 mmol/L (ref 22–32)
Calcium: 8.3 mg/dL — ABNORMAL LOW (ref 8.9–10.3)
Chloride: 102 mmol/L (ref 98–111)
Creatinine, Ser: 1.16 mg/dL (ref 0.61–1.24)
GFR, Estimated: >60 mL/min
Glucose, Bld: 54 mg/dL — ABNORMAL LOW (ref 70–99)
Potassium: 4 mmol/L (ref 3.5–5.1)
Sodium: 136 mmol/L (ref 135–145)

## 2023-03-26 LAB — GLUCOSE, CAPILLARY
Glucose-Capillary: 73 mg/dL (ref 70–99)
Glucose-Capillary: 111 mg/dL — ABNORMAL HIGH (ref 70–99)
Glucose-Capillary: 158 mg/dL — ABNORMAL HIGH (ref 70–99)
Glucose-Capillary: 95 mg/dL (ref 70–99)

## 2023-03-26 MED ORDER — CLINDAMYCIN PHOSPHATE 600 MG/50ML IV SOLN
600.0000 mg | Freq: Two times a day (BID) | INTRAVENOUS | Status: DC
  Administered 2023-03-26 – 2023-03-28 (×4): 600 mg via INTRAVENOUS
  Filled 2023-03-26 (×4): qty 50

## 2023-03-26 NOTE — Evaluation (Signed)
Physical Therapy Evaluation Patient Details Name: [name] MRN: [id number] DOB: Jan 05, 1951 Today's Date: 03/26/2023  History of Present Illness  72 y/o male presented to ED on 03/23/23 for diarrhea x 2-3 days as well as L leg pain, redness, and swelling. Admitted for L leg cellulitis. PMH: depression, HTN, diabetes, COPD, heart failure with preserved ejection fraction, hx of hep C, adrenal insufficiency, liver cirrhosis, hx of TIA  Clinical Impression  Patient admitted with the above. PTA, patient is homeless and reports using rollator for mobility with limited distances able to ambulate due to LE weakness and fatigue. Patient presents with weakness, impaired balance, decreased activity tolerance, and pain. Complaining of increased pain and cramping in LLE with weightbearing. Able to complete bed mobility modI and transfers with min guard and use of RW. Anticipate patient will progress quickly once pain is managed. Patient will benefit from skilled PT services during acute stay to address listed deficits. Patient will benefit from ongoing therapy at discharge to maximize functional independence and safety.       If plan is discharge home, recommend the following: Assist for transportation   Can travel by private vehicle        Equipment Recommendations None recommended by PT  Recommendations for Other Services       Functional Status Assessment Patient has had a recent decline in their functional status and demonstrates the ability to make significant improvements in function in a reasonable and predictable amount of time.     Precautions / Restrictions Precautions Precautions: Fall Restrictions Weight Bearing Restrictions: No      Mobility  Bed Mobility Overal bed mobility: Modified Independent             General bed mobility comments: HOB elevated with increased time to complete    Transfers Overall transfer level: Needs assistance Equipment used: Rolling [address]  (2 wheels) Transfers: Sit to/from Stand, Bed to chair/wheelchair/BSC Sit to Stand: Min guard   Step pivot transfers: Min guard       General transfer comment: min guard for safety to stand from low bed surface. Cues for hand placement. Able to complete step pivot transfer and reporting increased pain and cramping in LLE. Declined further ambulation    Ambulation/Gait               General Gait Details: declined due to increased pain with weightbearing  Stairs            Wheelchair Mobility     Tilt Bed    Modified Rankin (Stroke Patients Only)       Balance Overall balance assessment: Mild deficits observed, not formally tested                                           Pertinent Vitals/Pain Pain Assessment Pain Assessment: Faces Faces Pain Scale: Hurts even more Pain Location: LLE Pain Descriptors / Indicators: Cramping, Discomfort, Grimacing Pain Intervention(s): Monitored during session, Limited activity within patient's tolerance    Home Living Family/patient expects to be discharged to:: Shelter/Homeless                   Additional Comments: lives in a tent [place] [address] since 2016    Prior Function Prior Level of Function : Independent/Modified Independent             Mobility Comments: walks with a rollator or  SPC in the community. takes the bus for transportation. Reports wearing a knee brace for stability. Only tolerates 20-57ft bouts AMB at baseline with resting breaks due to COPD,  Just got a rollator recently. ADLs Comments: Mod I, uses a bed side commode in the tent for toileting     Hand Dominance        Extremity/Trunk Assessment   Upper Extremity Assessment Upper Extremity Assessment: Overall WFL for tasks assessed    Lower Extremity Assessment Lower Extremity Assessment: Generalized weakness    Cervical / Trunk Assessment Cervical / Trunk Assessment: Kyphotic  Communication   Communication:  No difficulties  Cognition Arousal/Alertness: Awake/alert Behavior During Therapy: WFL for tasks assessed/performed Overall Cognitive Status: Within Functional Limits for tasks assessed                                          General Comments      Exercises     Assessment/Plan    PT Assessment Patient needs continued PT services  PT Problem List Decreased strength;Decreased activity tolerance;Decreased balance;Decreased mobility;Decreased knowledge of use of DME;Cardiopulmonary status limiting activity;Pain       PT Treatment Interventions DME instruction;Gait training;Functional mobility training;Therapeutic activities;Therapeutic exercise;Balance training;Patient/family education    PT Goals (Current goals can be found in the Care Plan section)  Acute Rehab PT Goals Patient Stated Goal: to reduce pain PT Goal Formulation: With patient Time For Goal Achievement: 04/09/23 Potential to Achieve Goals: Good    Frequency Min 1X/week     Co-evaluation               AM-PAC PT "6 Clicks" Mobility  Outcome Measure Help needed turning from your back to your side while in a flat bed without using bedrails?: None Help needed moving from lying on your back to sitting on the side of a flat bed without using bedrails?: None Help needed moving to and from a bed to a chair (including a wheelchair)?: A Little Help needed standing up from a chair using your arms (e.g., wheelchair or bedside chair)?: A Little Help needed to walk in hospital room?: A Little Help needed climbing 3-5 steps with a railing? : A Little 6 Click Score: 20    End of Session   Activity Tolerance: Patient tolerated treatment well;Patient limited by pain Patient left: in chair;with call bell/phone within reach;with chair alarm set Nurse Communication: Mobility status PT Visit Diagnosis: Unsteadiness on feet (R26.81);Muscle weakness (generalized) (M62.81);Difficulty in walking, not elsewhere  classified (R26.2);Pain Pain - Right/Left: Left Pain - part of body: Leg    Time: 1610-9604 PT Time Calculation (min) (ACUTE ONLY): 24 min   Charges:   PT Evaluation $PT Eval Low Complexity: 1 Low PT Treatments $Therapeutic Activity: 8-22 mins PT General Charges $$ ACUTE PT VISIT: 1 Visit         Maylon Peppers, PT, DPT Physical Therapist - [facility]  [facility]   Aurelie Dicenzo A Rayshon Albaugh 03/26/2023, 9:57 AM

## 2023-03-26 NOTE — Plan of Care (Signed)
  Problem: Respiratory: Goal: Ability to maintain adequate ventilation will improve Outcome: Progressing   Problem: Coping: Goal: Ability to adjust to condition or change in health will improve Outcome: Progressing   Problem: Metabolic: Goal: Ability to maintain appropriate glucose levels will improve Outcome: Progressing   Problem: Activity: Goal: Risk for activity intolerance will decrease Outcome: Progressing   Problem: Nutrition: Goal: Adequate nutrition will be maintained Outcome: Progressing   Problem: Coping: Goal: Level of anxiety will decrease Outcome: Progressing

## 2023-03-26 NOTE — Care Management Important Message (Signed)
Important Message  Patient Details  Name: [name] MRN: [id number] Date of Birth: 1951/06/12   Medicare Important Message Given:  Yes  Patient is in an isolation room and I called his room again [phone]) and he answered. I reviewed the Important Message from Medicare with him and he stated he understood his rights. I wished him a speedy recovery and thanked him for his time.   Olegario Messier A Helvi Royals 03/26/2023, 2:30 PM

## 2023-03-26 NOTE — Progress Notes (Signed)
   03/26/23 1600  Spiritual Encounters  Type of Visit Follow up   Chaplain followed up to visit with patient per request, and he is currently being cared for by staff. This Chaplain will follow up again tomorrow.

## 2023-03-26 NOTE — Care Management Important Message (Signed)
Important Message  Patient Details  Name: [name] MRN: [id number] Date of Birth: 1950/12/01   Medicare Important Message Given:  Other (see comment)  Patient is in an isolation room so I called his room [phone]) to review the Important Message from Medicare but there was no answer. Will try again.   Olegario Messier A Jarett Dralle 03/26/2023, 12:35 PM

## 2023-03-26 NOTE — Progress Notes (Signed)
This RN runs the antibiotic scheduled when pt seems weak and feeling warm to touch. VS was taken and temperature was 102.79F, making the MEWS Yellow. MD was notified, Ibuprofen PRN was given and MD will order repeat blood culture. Extra blankets were removed, oral fluid was encouraged. Will implement Yellow MEWS protocol.  03/26/23 1417  Assess: MEWS Score  Temp (!) 102.1 F (38.9 C)  BP 135/66  MAP (mmHg) 85  Pulse Rate 68  Resp 17  SpO2 94 %  O2 Device Room Air  Assess: MEWS Score  MEWS Temp 2  MEWS Systolic 0  MEWS Pulse 0  MEWS RR 0  MEWS LOC 0  MEWS Score 2  MEWS Score Color Yellow  Assess: if the MEWS score is Yellow or Red  Were vital signs accurate and taken at a resting state? Yes  Does the patient meet 2 or more of the SIRS criteria? No  MEWS guidelines implemented  Yes, yellow  Treat  MEWS Interventions Considered administering scheduled or prn medications/treatments as ordered  Take Vital Signs  Increase Vital Sign Frequency  Yellow: Q2hr x1, continue Q4hrs until patient remains green for 12hrs  Escalate  MEWS: Escalate Yellow: Discuss with charge nurse and consider notifying provider and/or RRT  Provider Notification  Provider Name/Title Sunnie Nielsen  Date Provider Notified 03/26/23  Time Provider Notified 1420  Method of Notification Page (secured chat)  Notification Reason Other (Comment) (Yellow MEWS. fever.)  Provider response See new orders (rpt blood culture.)  Date of Provider Response 03/26/23  Time of Provider Response 1431  Assess: SIRS CRITERIA  SIRS Temperature  1  SIRS Pulse 0  SIRS Respirations  0  SIRS WBC 0  SIRS Score Sum  1

## 2023-03-26 NOTE — Progress Notes (Signed)
PROGRESS NOTE    [name]   UVO:[id number] DOB: 1950-10-14  DOA: 03/23/2023 Date of Service: 03/26/23 PCP: [name], MD     Brief Narrative / Hospital Course:  [name] is a 72 year old male with hyperlipidemia, depression, hypertension, non-insulin-dependent diabetes mellitus, COPD, heart failure preserved ejection fraction, history of hepatitis C, adrenal insufficiency, current unhoused status, who presents to the emergency department for chief concerns of diarrhea, left leg redness. 07/27: admitted for sepsis d/t cellulitis  07/28: continuing IV abx await BCx  07/29: leg still significant erythema, tenderness. BCx neg. Lasix IVx1, elevate legs.  07/30: still reports pain though erythema appears improved. (+)Fever to 102 this afternoon, repeat BCx and add clinda  Consultants:  none  Procedures: none      ASSESSMENT & PLAN:   Principal Problem:   Left leg cellulitis Active Problems:   Hx of transient ischemic attack (TIA)   Liver cirrhosis (HCC)   COPD (chronic obstructive pulmonary disease) (HCC)   HTN (hypertension)   Hyperlipidemia, unspecified   Diabetes mellitus without complication (HCC)   Elevated troponin   History of hepatitis C   Hypokalemia   Depression   Adrenal insufficiency (HCC)   Leukocytosis   Serum total bilirubin elevated   Left leg pain   Left leg cellulitis Severe Sepsis d/t cellulitis - sepsis resolved  Patient meeting sepsis criteria on admission with increased respiration rate, leukocytosis, elevated lactic acid of 4.0, possible organ involvement of cardiac given elevated BNP and high sensitive troponin Suspect source is cellulitis in the left lower extremity Ceftriaxone switched to Cefazolin, but not improving and now febrile, will add clindamycin and consider imaging.  Erythema appears improved which is reassuring but low threshold for imaging especially if not improving w/ clindamycin.  IV fluids d/c d/e edema,  normal BP Elevate legs Lasix IV x1 in addition to his usual po dose  Pain control  CBC diff pending  Repeated BCx today d/t fever   Hypokalemia - resolved  Check magnesium level on admission --> WNL Replace as needed  Follow BMP   Leukocytosis - resolved Mild, secondary to cellulitis,  treat underlying cause Follow CBC   Anemia Mild Likely hemodilutional component Follow CBC  Adrenal insufficiency (HCC) Abnormal ACTH stimulation test Patient was started on hydrocortisone tablets during May 2024 admission Follow outpatient   Liver cirrhosis [facility]) Patient is not taking his home meds in the last few days due to not feeling well home rifaximin, spironolactone, furosemide have been resumed on admission  Hyperlipidemia, unspecified Atorvastatin   HTN (hypertension) but BP on the low side  Home losartan 50 mg daily held, spironolactone 25 mg daily reduced to 12.5  Depression Citalopram 20 mg daily resumed, quetiapine 50 mg nightly resumed  COPD (chronic obstructive pulmonary disease) (HCC) Not in acute exacerbation Albuterol nebulizer every 6 hours as needed for wheezing and shortness of breath  Elevated troponin Suspect secondary to patient with history of heart failure preserved ejection fraction and has not been taking his home spironolactone, furosemide, ARB's as prescribed Recheck if chest pain   Diabetes mellitus without complication (HCC) Patient is not taking his home Jardiance and metformin, with nonfasting blood glucose 102 continue to monitor with insulin SSI and at bedtime coverage  Hx of transient ischemic attack (TIA) Patient has not been taken his home medication over the last week Resumed home aspirin 81 mg, atorvastatin 40 mg daily     DVT prophylaxis: lovenox  Pertinent IV fluids/nutrition: no continuous IV fluids  Central lines / invasive devices: none  Code Status: FULL CODE ACP documentation reviewed: 03/24/23 none on file   Current  Admission Status: inpatient   TOC needs / Dispo plan: anticipate home but pt is unhoused, appreciate possibly getting shleter resources  Barriers to discharge / significant pending items: clinical improvement              Subjective / Brief ROS:  Patient reports still significant pain and edema  Feels hot but wrapped in lots of blankets.  Denies CP/SOB.  Pain LLE described as 10/10, also in RLE but 8/10 Not ambulating d/t pain  Denies new weakness.  Tolerating diet.  Reports no concerns w/ urination/defecation.   Family Communication: none at this time     Objective Findings:  Vitals:   03/25/23 1823 03/26/23 0005 03/26/23 0757 03/26/23 1417  BP:  (!) 117/59 121/61 135/66  Pulse: 64 (!) 49 (!) 50 68  Resp:  18 14 17   Temp:  98.2 F (36.8 C) 98 F (36.7 C) (!) 102.1 F (38.9 C)  TempSrc:    Oral  SpO2:  98% 97% 94%  Weight:      Height:        Intake/Output Summary (Last 24 hours) at 03/26/2023 1518 Last data filed at 03/26/2023 0620 Gross per 24 hour  Intake 980 ml  Output 2300 ml  Net -1320 ml   Filed Weights   03/23/23 1037  Weight: 111.1 kg    Examination:  Physical Exam Constitutional:      General: He is not in acute distress. Cardiovascular:     Rate and Rhythm: Normal rate and regular rhythm.  Pulmonary:     Effort: Pulmonary effort is normal.     Breath sounds: Normal breath sounds.  Abdominal:     General: Abdomen is flat.     Palpations: Abdomen is soft.  Musculoskeletal:     Right lower leg: No edema.     Left lower leg: Edema (mild) present.  Skin:    General: Skin is warm and dry.     Findings: Erythema present.       Neurological:     General: No focal deficit present.     Mental Status: He is alert and oriented to person, place, and time. Mental status is at baseline.  Psychiatric:        Mood and Affect: Mood normal.        Behavior: Behavior normal.          Scheduled Medications:   ascorbic acid  500 mg Oral  Daily   aspirin EC  81 mg Oral Daily   atorvastatin  40 mg Oral QHS   citalopram  20 mg Oral Daily   enoxaparin (LOVENOX) injection  0.5 mg/kg Subcutaneous Q24H   feeding supplement  237 mL Oral BID BM   furosemide  40 mg Oral Daily   hydrocortisone  10 mg Oral Daily   insulin aspart  0-15 Units Subcutaneous TID WC   insulin aspart  0-5 Units Subcutaneous QHS   multivitamin with minerals  1 tablet Oral Daily   pantoprazole  40 mg Oral QAC breakfast   QUEtiapine  50 mg Oral QHS   rifaximin  550 mg Oral BID   spironolactone  12.5 mg Oral Daily    Continuous Infusions:   ceFAZolin (ANCEF) IV 2 g (03/26/23 1412)   clindamycin (CLEOCIN) IV      PRN Medications:  hydrALAZINE, ibuprofen, melatonin, nitroGLYCERIN, ondansetron **OR** ondansetron (ZOFRAN)  IV, [EXPIRED] oxyCODONE-acetaminophen **AND** oxyCODONE, senna-docusate  Antimicrobials from admission:  Anti-infectives (From admission, onward)    Start     Dose/Rate Route Frequency Ordered Stop   03/26/23 2200  clindamycin (CLEOCIN) IVPB 600 mg        600 mg 100 mL/hr over 30 Minutes Intravenous Every 12 hours 03/26/23 1518     03/24/23 1400  ceFAZolin (ANCEF) IVPB 2g/100 mL premix        2 g 200 mL/hr over 30 Minutes Intravenous Every 8 hours 03/24/23 0913     03/24/23 1000  cefTRIAXone (ROCEPHIN) 2 g in sodium chloride 0.9 % 100 mL IVPB  Status:  Discontinued        2 g 200 mL/hr over 30 Minutes Intravenous Every 24 hours 03/23/23 1336 03/23/23 1337   03/23/23 2200  rifaximin (XIFAXAN) tablet 550 mg        550 mg Oral 2 times daily 03/23/23 1407     03/23/23 1200  cefTRIAXone (ROCEPHIN) 2 g in sodium chloride 0.9 % 100 mL IVPB  Status:  Discontinued        2 g 200 mL/hr over 30 Minutes Intravenous Every 24 hours 03/23/23 1156 03/24/23 0913           Data Reviewed:  I have personally reviewed the following...  CBC: Recent Labs  Lab 03/23/23 1046 03/24/23 0458  WBC 11.2* 5.2  NEUTROABS 10.0*  --   HGB 11.1*  9.5*  HCT 33.8* 29.6*  MCV 92.1 93.4  PLT 75* 61*   Basic Metabolic Panel: Recent Labs  Lab 03/23/23 1046 03/23/23 1048 03/24/23 0458 03/26/23 0528  NA 136  --  136 136  K 3.2*  --  3.5 4.0  CL 104  --  108 102  CO2 21*  --  23 26  GLUCOSE 102*  --  81 54*  BUN 14  --  18 27*  CREATININE 1.15  --  1.03 1.16  CALCIUM 8.4*  --  7.8* 8.3*  MG  --  1.8  --   --    GFR: Estimated Creatinine Clearance: 66.3 mL/min (by C-G formula based on SCr of 1.16 mg/dL). Liver Function Tests: Recent Labs  Lab 03/23/23 1046  AST 41  ALT 21  ALKPHOS 71  BILITOT 3.3*  PROT 6.9  ALBUMIN 2.6*   No results for input(s): "LIPASE", "AMYLASE" in the last 168 hours. No results for input(s): "AMMONIA" in the last 168 hours. Coagulation Profile: Recent Labs  Lab 03/23/23 1046  INR 1.4*   Cardiac Enzymes: No results for input(s): "CKTOTAL", "CKMB", "CKMBINDEX", "TROPONINI" in the last 168 hours. BNP (last 3 results) No results for input(s): "PROBNP" in the last 8760 hours. HbA1C: No results for input(s): "HGBA1C" in the last 72 hours. CBG: Recent Labs  Lab 03/25/23 1145 03/25/23 1712 03/25/23 2134 03/26/23 0757 03/26/23 1131  GLUCAP 117* 116* 95 73 111*   Lipid Profile: No results for input(s): "CHOL", "HDL", "LDLCALC", "TRIG", "CHOLHDL", "LDLDIRECT" in the last 72 hours. Thyroid Function Tests: No results for input(s): "TSH", "T4TOTAL", "FREET4", "T3FREE", "THYROIDAB" in the last 72 hours. Anemia Panel: No results for input(s): "VITAMINB12", "FOLATE", "FERRITIN", "TIBC", "IRON", "RETICCTPCT" in the last 72 hours. Most Recent Urinalysis On File:     Component Value Date/Time   COLORURINE YELLOW (A) 03/12/2023 2012   APPEARANCEUR CLEAR (A) 03/12/2023 2012   LABSPEC 1.018 03/12/2023 2012   PHURINE 6.0 03/12/2023 2012   GLUCOSEU NEGATIVE 03/12/2023 2012   HGBUR NEGATIVE 03/12/2023 2012  BILIRUBINUR NEGATIVE 03/12/2023 2012   KETONESUR NEGATIVE 03/12/2023 2012   PROTEINUR 30  (A) 03/12/2023 2012   NITRITE NEGATIVE 03/12/2023 2012   LEUKOCYTESUR NEGATIVE 03/12/2023 2012   Sepsis Labs: @LABRCNTIP (procalcitonin:4,lacticidven:4) Microbiology: Recent Results (from the past 240 hour(s))  Blood Culture (routine x 2)     Status: None (Preliminary result)   Collection Time: 03/23/23 10:46 AM   Specimen: BLOOD  Result Value Ref Range Status   Specimen Description BLOOD LEFT ANTECUBITAL  Final   Special Requests   Final    BOTTLES DRAWN AEROBIC AND ANAEROBIC Blood Culture results may not be optimal due to an inadequate volume of blood received in culture bottles   Culture   Final    NO GROWTH 3 DAYS Performed at [facility], [address] [place], [place] [address]    Report Status PENDING  Incomplete  Blood Culture (routine x 2)     Status: None (Preliminary result)   Collection Time: 03/23/23 10:46 AM   Specimen: BLOOD  Result Value Ref Range Status   Specimen Description BLOOD  RAC  Final   Special Requests   Final    BOTTLES DRAWN AEROBIC AND ANAEROBIC Blood Culture results may not be optimal due to an inadequate volume of blood received in culture bottles   Culture   Final    NO GROWTH 3 DAYS Performed at [facility], [address] [place], [place] [address]    Report Status PENDING  Incomplete  SARS Coronavirus 2 by RT PCR (hospital order, performed in [facility] hospital lab) *cepheid single result test* Anterior Nasal Swab     Status: None   Collection Time: 03/23/23 10:48 AM   Specimen: Anterior Nasal Swab  Result Value Ref Range Status   SARS Coronavirus 2 by RT PCR NEGATIVE NEGATIVE Final    Comment: (NOTE) SARS-CoV-2 target nucleic acids are NOT DETECTED.  The SARS-CoV-2 RNA is generally detectable in upper and lower respiratory specimens during the acute phase of infection. The lowest concentration of SARS-CoV-2 viral copies this assay can detect is 250 copies / mL. A negative result does not preclude SARS-CoV-2  infection and should not be used as the sole basis for treatment or other patient management decisions.  A negative result may occur with improper specimen collection / handling, submission of specimen other than nasopharyngeal swab, presence of viral mutation(s) within the areas targeted by this assay, and inadequate number of viral copies (<250 copies / mL). A negative result must be combined with clinical observations, patient history, and epidemiological information.  Fact Sheet for Patients:   RoadLapTop.co.za  Fact Sheet for Healthcare Providers: http://kim-miller.com/  This test is not yet approved or  cleared by the [place] FDA and has been authorized for detection and/or diagnosis of SARS-CoV-2 by FDA under an Emergency Use Authorization (EUA).  This EUA will remain in effect (meaning this test can be used) for the duration of the COVID-19 declaration under Section 564(b)(1) of the Act, 21 U.S.C. section 360bbb-3(b)(1), unless the authorization is terminated or revoked sooner.  Performed at [facility], [address], [place], [place] [address]       Radiology Studies last 3 days: CT Angio Chest PE W/Cm &/Or Wo Cm  Result Date: 03/23/2023 CLINICAL DATA:  Positive D-dimer. Left lower extremity swelling and pain. EXAM: CT ANGIOGRAPHY CHEST WITH CONTRAST TECHNIQUE: Multidetector CT imaging of the chest was performed using the standard protocol during bolus administration of intravenous contrast. Multiplanar CT image  reconstructions and MIPs were obtained to evaluate the vascular anatomy. RADIATION DOSE REDUCTION: This exam was performed according to the departmental dose-optimization program which includes automated exposure control, adjustment of the mA and/or kV according to patient size and/or use of iterative reconstruction technique. CONTRAST:  75mL OMNIPAQUE IOHEXOL 350 MG/ML SOLN COMPARISON:  Jan 08, 2023.  FINDINGS: Cardiovascular: Satisfactory opacification of the pulmonary arteries to the segmental level. No evidence of pulmonary embolism. Mild cardiomegaly. Enlarged pulmonary artery suggesting pulmonary hypertension. No pericardial effusion. Mediastinum/Nodes: No enlarged mediastinal, hilar, or axillary lymph nodes. Thyroid gland, trachea, and esophagus demonstrate no significant findings. Lungs/Pleura: Lungs are clear. No pleural effusion or pneumothorax. Upper Abdomen: Hepatic cirrhosis. Musculoskeletal: Stable old sternal fracture. No acute osseous abnormality is noted. Review of the MIP images confirms the above findings. IMPRESSION: No definite evidence of pulmonary embolus. Stable other chronic findings as described above. Electronically Signed   By: Lupita Raider M.D.   On: 03/23/2023 12:47   US Venous Img Lower Unilateral Left  Result Date: 03/23/2023 CLINICAL DATA:  Left lower extremity edema EXAM: LEFT LOWER EXTREMITY VENOUS DOPPLER ULTRASOUND TECHNIQUE: Gray-scale sonography with compression, as well as color and duplex ultrasound, were performed to evaluate the deep venous system(s) from the level of the common femoral vein through the popliteal and proximal calf veins. COMPARISON:  None Available. FINDINGS: VENOUS Normal compressibility of the common femoral, superficial femoral, and popliteal veins, as well as the visualized calf veins. Visualized portions of profunda femoral vein and great saphenous vein unremarkable. No filling defects to suggest DVT on grayscale or color Doppler imaging. Doppler waveforms show normal direction of venous flow, normal respiratory plasticity and response to augmentation. Limited views of the contralateral common femoral vein are unremarkable. OTHER None. Limitations: none IMPRESSION: Negative. Electronically Signed   By: Malachy Moan M.D.   On: 03/23/2023 11:55   DG Chest Port 1 View  Result Date: 03/23/2023 CLINICAL DATA:  Provided history:  Questionable sepsis-evaluate for abnormality. EXAM: PORTABLE CHEST 1 VIEW COMPARISON:  Prior chest radiographs 01/12/2023 and earlier. Chest CT 01/08/2023 FINDINGS: Heart size at the upper limits of normal. Aortic atherosclerosis. Prominence of the main pulmonary artery contour. No appreciable airspace consolidation or pulmonary edema. No evidence of pleural effusion or pneumothorax. No acute osseous abnormality identified. IMPRESSION: 1. No evidence of an acute cardiopulmonary abnormality. 2. Redemonstrated prominence of the main pulmonary artery contour, which suggest pulmonary arterial hypertension. 3. Aortic Atherosclerosis (ICD10-I70.0). Electronically Signed   By: Jackey Loge D.O.   On: 03/23/2023 11:31             LOS: 3 days    Sunnie Nielsen, DO [facility] 03/26/2023, 3:18 PM    Dictation software may have been used to generate the above note. Typos may occur and escape review in typed/dictated notes. Please contact Dr Lyn Hollingshead directly for clarity if needed.  Staff may message me via secure chat in Epic  but this may not receive an immediate response,  please page me for urgent matters!  If 7PM-7AM, please contact night coverage [email or web address]

## 2023-03-27 DIAGNOSIS — L03116 Cellulitis of left lower limb: Secondary | ICD-10-CM | POA: Diagnosis not present

## 2023-03-27 LAB — GLUCOSE, CAPILLARY
Glucose-Capillary: 54 mg/dL — ABNORMAL LOW (ref 70–99)
Glucose-Capillary: 128 mg/dL — ABNORMAL HIGH (ref 70–99)
Glucose-Capillary: 157 mg/dL — ABNORMAL HIGH (ref 70–99)
Glucose-Capillary: 93 mg/dL (ref 70–99)
Glucose-Capillary: 159 mg/dL — ABNORMAL HIGH (ref 70–99)

## 2023-03-27 MED ORDER — IBUPROFEN 400 MG PO TABS: 600.0000 mg | ORAL_TABLET | Freq: Four times a day (QID) | ORAL | Status: DC | PRN

## 2023-03-27 MED ORDER — INSULIN ASPART 100 UNIT/ML IJ SOLN
0.0000 [IU] | Freq: Three times a day (TID) | INTRAMUSCULAR | Status: DC
  Administered 2023-03-28: 1 [IU] via SUBCUTANEOUS

## 2023-03-27 MED ORDER — INSULIN ASPART 100 UNIT/ML IJ SOLN: 0.0000 [IU] | Freq: Every day | INTRAMUSCULAR | Status: DC

## 2023-03-27 MED ORDER — ACETAMINOPHEN 500 MG PO TABS: 500.0000 mg | ORAL_TABLET | Freq: Four times a day (QID) | ORAL | Status: DC | PRN

## 2023-03-27 NOTE — Plan of Care (Signed)

## 2023-03-27 NOTE — Progress Notes (Signed)
2000 assessed pt and noticed left leg was more swollen then the rt leg in the upp thigh area it was red and hot and discoloration to the skin. Pt c/o 7/10 pain and 10/10 pain when weight bearing on the left leg . Also IV in left hand infiltrated new iv order was placed  2036 IV consult was ordered 0022 IV consult was ordered 0100 IV was placed by floor nurse and antibiotics was given  0230 pt resting  0630 pt resting

## 2023-03-27 NOTE — Progress Notes (Signed)
Progress Note   Patient: [name] EXB:[id number] DOB: 09-May-1951 DOA: 03/23/2023     4 DOS: the patient was seen and examined on 03/27/2023  Subjective / Brief ROS:  Patient seen and examined at bedside this morning Denies nausea vomiting chest pain abdominal pain Complaining of some redness to the left thigh area Patient has been fever free for at least 18 hours now   Brief Narrative / Hospital Course:  [name] is a 72 year old male with hyperlipidemia, depression, hypertension, non-insulin-dependent diabetes mellitus, COPD, heart failure preserved ejection fraction, history of hepatitis C, adrenal insufficiency, current unhoused status, who presents to the emergency department for chief concerns of diarrhea, left leg redness. 07/27: admitted for sepsis d/t cellulitis  07/28: continuing IV abx await BCx  07/29: leg still significant erythema, tenderness. BCx neg. Lasix IVx1, elevate legs.  07/30: still reports pain though erythema appears improved. (+)Fever to 102 this afternoon, repeat BCx and add clinda     ASSESSMENT & PLAN:   Principal Problem:   Left leg cellulitis Active Problems:   Hx of transient ischemic attack (TIA)   Liver cirrhosis (HCC)   COPD (chronic obstructive pulmonary disease) (HCC)   HTN (hypertension)   Hyperlipidemia, unspecified   Diabetes mellitus without complication (HCC)   Elevated troponin   History of hepatitis C   Hypokalemia   Depression   Adrenal insufficiency (HCC)   Leukocytosis   Serum total bilirubin elevated   Left leg pain     Left leg cellulitis Severe Sepsis d/t cellulitis - sepsis resolved  Patient meeting sepsis criteria on admission with increased respiration rate, leukocytosis, elevated lactic acid of 4.0, possible organ involvement of cardiac given elevated BNP and high sensitive troponin Suspect source is cellulitis in the left lower extremity Continue current antibiotics Continue lower extremity  elevation Continue as needed pain medication Monitor culture results closely   Hypokalemia - resolved  Continue repletion and monitoring   Leukocytosis - resolved Mild, secondary to cellulitis,  treat underlying cause Follow CBC    Anemia Mild No acute intervention at this time, patient does not need any blood transfusion   Adrenal insufficiency (HCC) Abnormal ACTH stimulation test Patient was started on hydrocortisone tablets during May 2024 admission Outpatient follow-up   Liver cirrhosis (HCC) Patient is not taking his home meds in the last few days due to not feeling well home rifaximin, spironolactone, furosemide have been resumed on admission   Hyperlipidemia, unspecified Continue atorvastatin    HTN (hypertension) but BP on the low side  Home losartan 50 mg daily held, spironolactone 25 mg daily reduced to 12.5   Depression Continue citalopram 20 mg daily resumed, quetiapine 50 mg nightly resumed   COPD (chronic obstructive pulmonary disease) (HCC) Not in acute exacerbation Albuterol nebulizer every 6 hours as needed for wheezing and shortness of breath   Elevated troponin Suspect secondary to patient with history of heart failure preserved ejection fraction and has not been taking his home spironolactone, furosemide, ARB's as prescribed Recheck if chest pain    Diabetes mellitus without complication (HCC) Patient is not taking his home Jardiance and metformin, with nonfasting blood glucose 102 continue to monitor with insulin SSI and at bedtime coverage   Hx of transient ischemic attack (TIA) Patient has not been taken his home medication over the last week Continue home aspirin 81 mg, atorvastatin 40 mg daily         DVT prophylaxis: lovenox    CODE STATUS full code  Family  Communication: none at this time        Physical Exam Constitutional:      General: He is not in acute distress. Cardiovascular:     Rate and Rhythm: Normal rate and  regular rhythm.  Pulmonary:     Effort: Pulmonary effort is normal.     Breath sounds: Normal breath sounds.  Abdominal:     General: Abdomen is flat.     Palpations: Abdomen is soft.  Musculoskeletal: Erythema noted to the left thigh       Neurological: Alert and oriented x 3      Behavior: Behavior normal.         Vitals:   03/27/23 0453 03/27/23 0737 03/27/23 0743 03/27/23 1539  BP: (!) 102/56 (!) 98/48 (!) 102/51 129/64  Pulse: (!) 44 (!) 43 (!) 43 (!) 48  Resp: 18 14 18 14   Temp: 98.2 F (36.8 C) 98.1 F (36.7 C) 97.8 F (36.6 C) 98.4 F (36.9 C)  TempSrc:   Oral   SpO2: 97% 98% 99% 96%  Weight:      Height:        Data Reviewed: I have reviewed patient's labs, nursing documentation, I discussed the case with diabetic coordinator, I have reviewed previous provider documentation and vitals  Author: Loyce Dys, MD 03/27/2023 5:08 PM  For on call review [email or web address].

## 2023-03-27 NOTE — Inpatient Diabetes Management (Signed)
Inpatient Diabetes Program Recommendations  AACE/ADA: New Consensus Statement on Inpatient Glycemic Control (2015)  Target Ranges:  Prepandial:   less than 140 mg/dL      Peak postprandial:   less than 180 mg/dL (1-2 hours)      Critically ill patients:  140 - 180 mg/dL   Lab Results  Component Value Date   GLUCAP 128 (H) 03/27/2023   HGBA1C 5.1 12/24/2022    Latest Reference Range & Units 03/26/23 07:57 03/26/23 11:31 03/26/23 16:09 03/26/23 21:33 03/27/23 07:59 03/27/23 09:00  Glucose-Capillary 70 - 99 mg/dL 73 811 (H) 914 (H) Novolog 3 units 95 54 (L) 128 (H)  (H): Data is abnormally high (L): Data is abnormally low  Review of Glycemic Control  Diabetes history: DM2 Outpatient Diabetes medications: Jardiance 10 every day, Metformin 500 every day (not taking)  Current orders for Inpatient glycemic control: Novolog 0-15 units tid correction, 0-5 units hs  Inpatient Diabetes Program Recommendations:   Patient had hypoglycemia of 54 post Novolog correction of 3 units. Please consider: -Decrease Novolog correction to 0-6 units tid, 0-5 units hs  Thank you, Darel Hong E. Kaylise Blakeley, RN, MSN, CDE  Diabetes Coordinator Inpatient Glycemic Control Team Team Pager [phone] (8am-5pm) 03/27/2023 11:25 AM

## 2023-03-27 NOTE — Progress Notes (Signed)
Physical Therapy Treatment Patient Details Name: [name] MRN: [id number] DOB: Dec 14, 1950 Today's Date: 03/27/2023   History of Present Illness 72 y/o male presented to ED on 03/23/23 for diarrhea x 2-3 days as well as L leg pain, redness, and swelling. Admitted for L leg cellulitis. PMH: depression, HTN, diabetes, COPD, heart failure with preserved ejection fraction, hx of hep C, adrenal insufficiency, liver cirrhosis, hx of TIA    PT Comments  Patient progressing towards physical therapy goals. Requesting to ambulate to bathroom but declined further ambulation 2/2 dizziness and pain in LLE. Patient ambulated with IV pole and SPC with min guard for safety. Declined use of RW for mobility due to bathroom being "too confined". Encouraged sitting up in chair, however patient declining and wanting to rest. Discharge plan remains appropriate.      If plan is discharge home, recommend the following: Assist for transportation   Can travel by private vehicle        Equipment Recommendations  None recommended by PT    Recommendations for Other Services       Precautions / Restrictions Precautions Precautions: Fall Restrictions Weight Bearing Restrictions: No     Mobility  Bed Mobility Overal bed mobility: Modified Independent             General bed mobility comments: HOB elevated with increased time to complete    Transfers Overall transfer level: Needs assistance Equipment used: Rolling [name] (2 wheels) Transfers: Sit to/from Stand Sit to Stand: Min guard                Ambulation/Gait Ambulation/Gait assistance: Min guard Gait Distance (Feet): 12 Feet (+12') Assistive device: IV Pole, Straight cane Gait Pattern/deviations: Step-through pattern, Decreased stride length Gait velocity: decreased     General Gait Details: declined use of RW due to bathroom being "too confined". Min guard for safety with ambulation in/out of bathroom. Patient complaining of  dizziness. Declined further mobility 2/2 dizziness and pain   Stairs             Wheelchair Mobility     Tilt Bed    Modified Rankin (Stroke Patients Only)       Balance Overall balance assessment: Mild deficits observed, not formally tested                                          Cognition Arousal/Alertness: Awake/alert Behavior During Therapy: WFL for tasks assessed/performed Overall Cognitive Status: Within Functional Limits for tasks assessed                                          Exercises      General Comments        Pertinent Vitals/Pain Pain Assessment Pain Assessment: Faces Faces Pain Scale: Hurts little more Pain Location: LLE Pain Descriptors / Indicators: Cramping, Discomfort, Grimacing Pain Intervention(s): Monitored during session, Repositioned, Limited activity within patient's tolerance    Home Living                          Prior Function            PT Goals (current goals can now be found in the care plan section) Acute Rehab PT Goals Patient Stated Goal: to  reduce pain PT Goal Formulation: With patient Time For Goal Achievement: 04/09/23 Potential to Achieve Goals: Good Progress towards PT goals: Progressing toward goals    Frequency    Min 1X/week      PT Plan Current plan remains appropriate    Co-evaluation              AM-PAC PT "6 Clicks" Mobility   Outcome Measure  Help needed turning from your back to your side while in a flat bed without using bedrails?: None Help needed moving from lying on your back to sitting on the side of a flat bed without using bedrails?: None Help needed moving to and from a bed to a chair (including a wheelchair)?: A Little Help needed standing up from a chair using your arms (e.g., wheelchair or bedside chair)?: A Little Help needed to walk in hospital room?: A Little Help needed climbing 3-5 steps with a railing? : A Little 6  Click Score: 20    End of Session   Activity Tolerance: Patient tolerated treatment well Patient left: in bed;with call bell/phone within reach;with bed alarm set Nurse Communication: Mobility status PT Visit Diagnosis: Unsteadiness on feet (R26.81);Muscle weakness (generalized) (M62.81);Difficulty in walking, not elsewhere classified (R26.2);Pain Pain - Right/Left: Left Pain - part of body: Leg     Time: 0956-1010 PT Time Calculation (min) (ACUTE ONLY): 14 min  Charges:    $Therapeutic Activity: 8-22 mins PT General Charges $$ ACUTE PT VISIT: 1 Visit                     [name], PT, DPT Physical Therapist - [name]  [name]    [name] 03/27/2023, 10:17 AM

## 2023-03-28 DIAGNOSIS — L03116 Cellulitis of left lower limb: Secondary | ICD-10-CM | POA: Diagnosis not present

## 2023-03-28 LAB — GLUCOSE, CAPILLARY
Glucose-Capillary: 80 mg/dL (ref 70–99)
Glucose-Capillary: 172 mg/dL — ABNORMAL HIGH (ref 70–99)

## 2023-03-28 LAB — CULTURE, BLOOD (ROUTINE X 2)
Culture: NO GROWTH
Culture: NO GROWTH

## 2023-03-28 MED ORDER — SENNOSIDES-DOCUSATE SODIUM 8.6-50 MG PO TABS: 1.0000 | ORAL_TABLET | Freq: Every evening | ORAL | 0 refills | Status: DC | PRN

## 2023-03-28 MED ORDER — ONDANSETRON HCL 4 MG PO TABS: 4.0000 mg | ORAL_TABLET | Freq: Four times a day (QID) | ORAL | 0 refills | Status: DC | PRN

## 2023-03-28 MED ORDER — SPIRONOLACTONE 25 MG PO TABS: 12.5000 mg | ORAL_TABLET | Freq: Every day | ORAL | 3 refills | Status: DC

## 2023-03-28 MED ORDER — CEPHALEXIN 500 MG PO CAPS: 500.0000 mg | ORAL_CAPSULE | Freq: Four times a day (QID) | ORAL | 0 refills | Status: AC

## 2023-03-28 NOTE — Progress Notes (Addendum)
Mobility Specialist - Progress Note   03/28/23 1123  Mobility  Activity Ambulated with assistance in room;Stood at bedside  Level of Assistance Standby assist, set-up cues, supervision of patient - no hands on  Assistive Device Cane  Distance Ambulated (ft) 30 ft  Activity Response Tolerated well  $Mobility charge 1 Mobility  Mobility Specialist Start Time (ACUTE ONLY) 1014  Mobility Specialist Stop Time (ACUTE ONLY) 1022  Mobility Specialist Time Calculation (min) (ACUTE ONLY) 8 min   Pt supine upon entry, utilizing RA. Pt expressed LLE pain, agreeable to OOB amb within the room this date. Pt completed bed mob indep and STS to cane SBA. Pt amb to the door in room and back to bed x3 with SBA--occasional furniture cruising. Pt returned to bed, left supine with alarm set and needs within reach. RN notified.   Zetta Bills Mobility Specialist 03/28/23 11:27 AM

## 2023-03-28 NOTE — TOC Progression Note (Signed)
Transition of Care [facility]) - Progression Note    Patient Details  Name: [name] MRN: [id number] Date of Birth: 1950/12/14  Transition of Care [facility] CM/SW Contact  Marlowe Sax, RN Phone Number: 03/28/2023, 2:11 PM  Clinical Narrative:     Provided the patient with a Taxi Voucher, Called [place], they will come in about 59 Min, Faxed Voucher to [place] Provided the patient with 1 pair of pants, 1 shirt, 1 pair of underwear and 1 pair of socks       Expected Discharge Plan and Services         Expected Discharge Date: 03/28/23                                     Social Determinants of Health (SDOH) Interventions SDOH Screenings   Food Insecurity: Food Insecurity Present (03/25/2023)  Housing: High Risk (03/25/2023)  Transportation Needs: Unmet Transportation Needs (03/25/2023)  Utilities: At Risk (03/25/2023)  Alcohol Screen: Low Risk  (09/24/2022)  Depression (PHQ2-9): High Risk (04/26/2022)  Financial Resource Strain: High Risk (04/27/2022)  Physical Activity: Sufficiently Active (04/16/2018)  Social Connections: Moderately Isolated (04/30/2018)  Stress: Stress Concern Present (04/16/2018)  Tobacco Use: Medium Risk (03/23/2023)    Readmission Risk Interventions    11/07/2022    1:45 PM  Readmission Risk Prevention Plan  Transportation Screening Complete  SW Recovery Care/Counseling Consult Complete  Palliative Care Screening Not Applicable  Skilled Nursing Facility Not Applicable

## 2023-03-28 NOTE — Plan of Care (Signed)

## 2023-03-28 NOTE — Discharge Summary (Signed)
Physician Discharge Summary   Patient: [name] MRN: [id number] DOB: 1951/02/28  Admit date:     03/23/2023  Discharge date: 03/28/23  Discharge Physician: Loyce Dys   PCP: [name], MD     Discharge Diagnoses: Principal Problem:   Left leg cellulitis Active Problems:   Hx of transient ischemic attack (TIA)   Liver cirrhosis (HCC)   COPD (chronic obstructive pulmonary disease) (HCC)   HTN (hypertension)   Hyperlipidemia, unspecified   Diabetes mellitus without complication (HCC)   Elevated troponin   History of hepatitis C   Hypokalemia   Depression   Adrenal insufficiency (HCC)   Leukocytosis   Serum total bilirubin elevated   Left leg pain  Hospital Course: [name] is a 72 year old male with hyperlipidemia, depression, hypertension, non-insulin-dependent diabetes mellitus, COPD, heart failure preserved ejection fraction, history of hepatitis C, adrenal insufficiency, current unhoused status, who presents to the emergency department for chief concerns of diarrhea, left leg redness.  Was admitted for sepsis secondary to left leg cellulitis that responded to IV antibiotics.  Patient is now cleared for discharge to complete oral antibiotics and to follow-up with primary care physician.  Consultants: None Procedures performed: None Disposition: Home health Diet recommendation:  Cardiac diet DISCHARGE MEDICATION: Allergies as of 03/28/2023       Reactions   Bee Venom Anaphylaxis   Codeine Itching   Only itching. Recently allergy tested at [facility]   Lactulose Diarrhea   Patient states causes diarrhea   Novocain [procaine]    Sulfa Antibiotics Other (See Comments)        Medication List     STOP taking these medications    glucosamine-chondroitin 500-400 MG tablet   hydrocortisone 10 MG tablet Commonly known as: Cortef   hydrOXYzine 10 MG tablet Commonly known as: ATARAX   ibuprofen 600 MG tablet Commonly known as: ADVIL   losartan 50 MG  tablet Commonly known as: Cozaar   meclizine 25 MG tablet Commonly known as: ANTIVERT   oxyCODONE 5 MG immediate release tablet Commonly known as: Roxicodone   potassium chloride SA 20 MEQ tablet Commonly known as: KLOR-CON M       TAKE these medications    ascorbic acid 500 MG tablet Commonly known as: VITAMIN C Take 1 tablet (500 mg total) by mouth daily.   aspirin EC 81 MG tablet Take 1 tablet (81 mg total) by mouth daily. Swallow whole.   atorvastatin 40 MG tablet Commonly known as: Lipitor Take 1 tablet (40 mg total) by mouth daily.   cephALEXin 500 MG capsule Commonly known as: KEFLEX Take 1 capsule (500 mg total) by mouth 4 (four) times daily for 10 days.   citalopram 20 MG tablet Commonly known as: CeleXA Take 1 tablet (20 mg total) by mouth daily.   doxepin 50 MG capsule Commonly known as: SINEQUAN Take 1 capsule (50 mg total) by mouth at bedtime.   empagliflozin 10 MG Tabs tablet Commonly known as: Jardiance Take 1 tablet (10 mg total) by mouth daily before breakfast.   furosemide 40 MG tablet Commonly known as: Lasix Take 1.5 tablets (60 mg total) by mouth 2 (two) times daily for 5 days, THEN 1 tablet (40 mg total) daily. Start taking on: Jan 17, 2023   metFORMIN 500 MG tablet Commonly known as: GLUCOPHAGE Take 500 mg by mouth daily.   multivitamin with minerals Tabs tablet Take 1 tablet by mouth daily.   nitroGLYCERIN 0.4 MG SL tablet Commonly known as:  NITROSTAT Place 1 tablet (0.4 mg total) under the tongue every 5 (five) minutes x 3 doses as needed for chest pain. What changed: Another medication with the same name was removed. Continue taking this medication, and follow the directions you see here.   ondansetron 4 MG tablet Commonly known as: ZOFRAN Take 1 tablet (4 mg total) by mouth every 6 (six) hours as needed for nausea.   pantoprazole 40 MG tablet Commonly known as: Protonix Take 1 tablet (40 mg total) by mouth daily.    QUEtiapine 50 MG tablet Commonly known as: SEROQUEL Take 1 tablet (50 mg total) by mouth at bedtime.   rifaximin 550 MG Tabs tablet Commonly known as: XIFAXAN Take 1 tablet (550 mg total) by mouth 2 (two) times daily.   senna-docusate 8.6-50 MG tablet Commonly known as: Senokot-S Take 1 tablet by mouth at bedtime as needed for mild constipation.   spironolactone 25 MG tablet Commonly known as: ALDACTONE Take 0.5 tablets (12.5 mg total) by mouth daily. What changed: how much to take        Discharge Exam: Filed Weights   03/23/23 1037  Weight: 111.1 kg   Constitutional:      General: He is not in acute distress. Cardiovascular:     Rate and Rhythm: Normal rate and regular rhythm.  Pulmonary:     Effort: Pulmonary effort is normal.     Breath sounds: Normal breath sounds.  Abdominal:     General: Abdomen is flat.     Palpations: Abdomen is soft.  Musculoskeletal: Edema of the left thigh is much better today Neurological: Alert and oriented x 3      Behavior: Behavior normal.   Condition at discharge: good     Discharge time spent:  35 minutes.  Signed: Loyce Dys, MD [facility] 03/28/2023

## 2023-03-28 NOTE — Progress Notes (Signed)
Pt has DC order. Case manager was informed about the pt's transportation and [facility] situation. Case manager gave taxi voucher. AVS was given to pt and all questions has been answered. Pending taxi to pick-up pt.

## 2023-04-02 ENCOUNTER — Emergency Department: Payer: 59

## 2023-04-02 ENCOUNTER — Emergency Department
Admission: EM | Admit: 2023-04-02 | Discharge: 2023-04-02 | Disposition: A | Discharge status: Home / Self Care | Payer: 59 | Attending: Emergency Medicine | Admitting: Emergency Medicine

## 2023-04-02 ENCOUNTER — Other Ambulatory Visit: Payer: Self-pay

## 2023-04-02 DIAGNOSIS — I509 Heart failure, unspecified: Secondary | ICD-10-CM | POA: Diagnosis not present

## 2023-04-02 DIAGNOSIS — J45909 Unspecified asthma, uncomplicated: Secondary | ICD-10-CM | POA: Diagnosis not present

## 2023-04-02 DIAGNOSIS — J449 Chronic obstructive pulmonary disease, unspecified: Secondary | ICD-10-CM | POA: Insufficient documentation

## 2023-04-02 DIAGNOSIS — I110 Hypertensive heart disease with heart failure: Secondary | ICD-10-CM | POA: Diagnosis not present

## 2023-04-02 DIAGNOSIS — R109 Unspecified abdominal pain: Secondary | ICD-10-CM

## 2023-04-02 DIAGNOSIS — R1033 Periumbilical pain: Secondary | ICD-10-CM | POA: Diagnosis not present

## 2023-04-02 LAB — COMPREHENSIVE METABOLIC PANEL
ALT: 26 U/L (ref 0–44)
AST: 74 U/L — ABNORMAL HIGH (ref 15–41)
Albumin: 2.4 g/dL — ABNORMAL LOW (ref 3.5–5.0)
Alkaline Phosphatase: 92 U/L (ref 38–126)
Anion gap: 6 (ref 5–15)
BUN: 16 mg/dL (ref 8–23)
CO2: 24 mmol/L (ref 22–32)
Calcium: 8.7 mg/dL — ABNORMAL LOW (ref 8.9–10.3)
Chloride: 106 mmol/L (ref 98–111)
Creatinine, Ser: 0.96 mg/dL (ref 0.61–1.24)
GFR, Estimated: >60 mL/min (ref >60)
Glucose, Bld: 82 mg/dL (ref 70–99)
Potassium: 3.8 mmol/L (ref 3.5–5.1)
Sodium: 136 mmol/L (ref 135–145)
Total Bilirubin: 1.2 mg/dL (ref 0.3–1.2)
Total Protein: 6.5 g/dL (ref 6.5–8.1)

## 2023-04-02 LAB — CBC
HCT: 32.5 % — ABNORMAL LOW (ref 39.0–52.0)
Hemoglobin: 10.3 g/dL — ABNORMAL LOW (ref 13.0–17.0)
MCH: 29.5 pg (ref 26.0–34.0)
MCHC: 31.7 g/dL (ref 30.0–36.0)
MCV: 93.1 fL (ref 80.0–100.0)
Platelets: 125 10*3/uL — ABNORMAL LOW (ref 150–400)
RBC: 3.49 MIL/uL — ABNORMAL LOW (ref 4.22–5.81)
RDW: 16 % — ABNORMAL HIGH (ref 11.5–15.5)
WBC: 3.8 10*3/uL — ABNORMAL LOW (ref 4.0–10.5)
nRBC: 0 % (ref 0.0–0.2)

## 2023-04-02 LAB — LIPASE, BLOOD: Lipase: 44 U/L (ref 11–51)

## 2023-04-02 MED ORDER — ACETAMINOPHEN 325 MG PO TABS
650.0000 mg | ORAL_TABLET | Freq: Once | ORAL | Status: AC
  Administered 2023-04-02: 650 mg via ORAL
  Filled 2023-04-02: qty 2

## 2023-04-02 NOTE — ED Triage Notes (Signed)
[facility], currently homeless. c/o ABD pain @hernia  site. Hernia to LLQ from previous appendectomy. Pt also reports back pain. 40-45 HR. 174/75. BGL: 89. GCS 15

## 2023-04-02 NOTE — ED Provider Notes (Signed)
   [facility] Provider Note    Event Date/Time   First MD Initiated Contact with Patient 04/02/23 2126     (approximate)  History   Chief Complaint: Abdominal pain  HPI  [name] is a 72 y.o. male with a past medical history of asthma, CHF, COPD, hypertension, presents to the emergency department for abdominal pain.  According to the patient he was pulling roots out of the ground today.  While pulling 1 he felt a pain or tear in the center of his abdomen.  Patient was concerned as he has a prior hernia that he could have disrupted or torn something.  Patient came to the emergency department for evaluation.  Denies any vomiting or diarrhea.  No fever.  Physical Exam   Triage Vital Signs: ED Triage Vitals [04/02/23 2129]  Encounter Vitals Group     BP      Systolic BP Percentile      Diastolic BP Percentile      Pulse      Resp      Temp      Temp src      SpO2      Weight 240 lb (108.9 kg)     Height 5\' 6"  (1.676 m)     Head Circumference      Peak Flow      Pain Score      Pain Loc      Pain Education      Exclude from Growth Chart     Most recent vital signs: There were no vitals filed for this visit.  General: Awake, no distress.  CV:  Good peripheral perfusion.  Regular rate and rhythm  Resp:  Normal effort.  Equal breath sounds bilaterally.  Abd:  No distention.  Soft, states tenderness to palpation in the center abdomen/periumbilical region.  No mass or hernia palpated.  ED Results / Procedures / Treatments    RADIOLOGY  I have reviewed interpret the CT images of the abdomen.  I do not see any obvious or significant abnormalities. Radiology has read the CT scan as no acute findings.  There is hepatic cirrhosis.   MEDICATIONS ORDERED IN ED: Medications - No data to display   IMPRESSION / MDM / ASSESSMENT AND PLAN / ED COURSE  I reviewed the triage vital signs and the nursing notes.  Patient's presentation is most  consistent with acute presentation with potential threat to life or bodily function.  Patient presents emergency department for abdominal pain that occurred while exerting himself pulling roots out of the ground.  Overall patient appears well no obvious hernia on exam although the patient does state mid abdominal tenderness to palpation.  We will check labs and obtain CT imaging.  Patient CT scans resulted essentially showing no acute finding.  I reviewed the images I do not see any obvious hernia or acute finding either.  Lab work is pending.  As long as labs are within normal limits anticipate likely discharge home.  CBC is normal/baseline, chemistry shows no concerning findings.  Lipase is normal.  Given the patient's reassuring workup suspect more muscular injury or abdominal wall injury.  Discussed over-the-counter medications such as Tylenol or ibuprofen.  FINAL CLINICAL IMPRESSION(S) / ED DIAGNOSES   Abdominal wall pain    Note:  This document was prepared using Dragon voice recognition software and may include unintentional dictation errors.   Minna Antis, MD 04/02/23 2234

## 2023-04-12 ENCOUNTER — Emergency Department: Payer: 59

## 2023-04-12 ENCOUNTER — Emergency Department
Admission: EM | Admit: 2023-04-12 | Discharge: 2023-04-12 | Disposition: A | Discharge status: Home / Self Care | Payer: 59 | Attending: Emergency Medicine | Admitting: Emergency Medicine

## 2023-04-12 ENCOUNTER — Other Ambulatory Visit: Payer: Self-pay

## 2023-04-12 DIAGNOSIS — J449 Chronic obstructive pulmonary disease, unspecified: Secondary | ICD-10-CM | POA: Diagnosis not present

## 2023-04-12 DIAGNOSIS — Z5900 Homelessness unspecified: Secondary | ICD-10-CM | POA: Insufficient documentation

## 2023-04-12 DIAGNOSIS — M25511 Pain in right shoulder: Secondary | ICD-10-CM | POA: Insufficient documentation

## 2023-04-12 DIAGNOSIS — S0990XA Unspecified injury of head, initial encounter: Secondary | ICD-10-CM | POA: Insufficient documentation

## 2023-04-12 DIAGNOSIS — W19XXXA Unspecified fall, initial encounter: Secondary | ICD-10-CM

## 2023-04-12 DIAGNOSIS — E119 Type 2 diabetes mellitus without complications: Secondary | ICD-10-CM | POA: Insufficient documentation

## 2023-04-12 DIAGNOSIS — I509 Heart failure, unspecified: Secondary | ICD-10-CM | POA: Insufficient documentation

## 2023-04-12 DIAGNOSIS — W010XXA Fall on same level from slipping, tripping and stumbling without subsequent striking against object, initial encounter: Secondary | ICD-10-CM | POA: Diagnosis not present

## 2023-04-12 DIAGNOSIS — M542 Cervicalgia: Secondary | ICD-10-CM | POA: Diagnosis not present

## 2023-04-12 DIAGNOSIS — M545 Low back pain, unspecified: Secondary | ICD-10-CM | POA: Diagnosis not present

## 2023-04-12 LAB — CBG MONITORING, ED: Glucose-Capillary: 132 mg/dL — ABNORMAL HIGH (ref 70–99)

## 2023-04-12 MED ORDER — KETOROLAC TROMETHAMINE 30 MG/ML IJ SOLN
30.0000 mg | Freq: Once | INTRAMUSCULAR | Status: AC
  Administered 2023-04-12: 30 mg via INTRAMUSCULAR
  Filled 2023-04-12: qty 1

## 2023-04-12 MED ORDER — LIDOCAINE 5 % EX PTCH
1.0000 | MEDICATED_PATCH | CUTANEOUS | Status: DC
  Administered 2023-04-12: 1 via TRANSDERMAL
  Filled 2023-04-12: qty 1

## 2023-04-12 MED ORDER — MORPHINE SULFATE (PF) 4 MG/ML IV SOLN
4.0000 mg | Freq: Once | INTRAVENOUS | Status: AC
  Administered 2023-04-12: 4 mg via INTRAMUSCULAR
  Filled 2023-04-12: qty 1

## 2023-04-12 MED ORDER — ACETAMINOPHEN 325 MG PO TABS
650.0000 mg | ORAL_TABLET | Freq: Once | ORAL | Status: DC
  Filled 2023-04-12: qty 2

## 2023-04-12 NOTE — Discharge Instructions (Addendum)
Your imaging was normal today.  Please take ibuprofen as needed for pain. You can return to the ED for any new or worsening symptoms.

## 2023-04-12 NOTE — ED Triage Notes (Signed)
BIB [facility] Patient fell over a guard rail while stepping into the woods to urinate.  Fell onto concrete.  C/O right neck, right shoulder, back.  Patient is homeless.

## 2023-04-12 NOTE — ED Provider Notes (Signed)
[facility] Provider Note    Event Date/Time   First MD Initiated Contact with Patient 04/12/23 1428     (approximate)   History   Fall   HPI  [name] is a 72 y.o. male PMH of asthma, COPD, CHF, diabetes, hepatitis C, liver cirrhosis who presents for evaluation after a fall this morning.  Patient tripped over a guardrail and fell onto concrete landing on his neck shoulder and back.  Patient reports pain to these areas.  Patient is also homeless.     Physical Exam   Triage Vital Signs: ED Triage Vitals  Encounter Vitals Group     BP 04/12/23 1308 (!) 120/57     Systolic BP Percentile --      Diastolic BP Percentile --      Pulse Rate 04/12/23 1306 (!) 54     Resp 04/12/23 1306 16     Temp 04/12/23 1306 98 F (36.7 C)     Temp Source 04/12/23 1306 Oral     SpO2 04/12/23 1306 97 %     Weight 04/12/23 1307 238 lb 1.6 oz (108 kg)     Height 04/12/23 1307 5\' 6"  (1.676 m)     Head Circumference --      Peak Flow --      Pain Score 04/12/23 1307 10     Pain Loc --      Pain Education --      Exclude from Growth Chart --     Most recent vital signs: Vitals:   04/12/23 1306 04/12/23 1308  BP:  (!) 120/57  Pulse: (!) 54   Resp: 16   Temp: 98 F (36.7 C)   SpO2: 97%     General: Awake, mild distress.  CV:  Good peripheral perfusion.  RRR. Resp:  Normal effort.  CTAB. Abd:  No distention.  Other:  Tenderness to palpation over the right lower and right side of his neck.  ROM of shoulder maintained but does elicit some pain.  Radial pulse 2+ and regular.  No gross neurodeficits.   ED Results / Procedures / Treatments   Labs (all labs ordered are listed, but only abnormal results are displayed) Labs Reviewed  CBG MONITORING, ED - Abnormal; Notable for the following components:      Result Value   Glucose-Capillary 132 (*)    All other components within normal limits    RADIOLOGY  CT head, CT neck, chest x-ray, right shoulder  x-ray obtained, I interpreted the images as well as reviewed the radiologist report.   PROCEDURES:  Critical Care performed: No  Procedures   MEDICATIONS ORDERED IN ED: Medications  lidocaine (LIDODERM) 5 % 1 patch (1 patch Transdermal Patch Applied 04/12/23 1431)  morphine (PF) 4 MG/ML injection 4 mg (has no administration in time range)  ketorolac (TORADOL) 30 MG/ML injection 30 mg (30 mg Intramuscular Given 04/12/23 1518)     IMPRESSION / MDM / ASSESSMENT AND PLAN / ED COURSE  I reviewed the triage vital signs and the nursing notes.                             72 year old male presents today after mechanical fall.  VSS in triage and patient in moderate pain on exam.  Differential diagnosis includes, but is not limited to, muscle strain, contusion, abrasion, fracture, intracranial bleed.  Patient's presentation is most consistent with acute complicated illness /  injury requiring diagnostic workup.  CT head, CT neck, chest x-ray and right shoulder x-ray obtained.  I interpreted the images as well as reviewed the radiologist report.  Imaging was negative.  Patient did not report an improvement in their symptoms after the lidocaine and toradol shot so he was given morphine before discharge.   Since patient's imaging is negative, I feel he is safe for discharge. I explained to the patient he will likely be sore for a few days. I advised him to take ibuprofen as needed.  Patient voiced understanding, all questions were answered and he was stable at discharge.     FINAL CLINICAL IMPRESSION(S) / ED DIAGNOSES   Final diagnoses:  Fall, initial encounter     Rx / DC Orders   ED Discharge Orders     None        Note:  This document was prepared using Dragon voice recognition software and may include unintentional dictation errors.   Cameron Ali, PA-C 04/12/23 1649    Chesley Noon, MD 04/12/23 [phone]

## 2023-04-13 ENCOUNTER — Other Ambulatory Visit: Payer: Self-pay

## 2023-04-13 ENCOUNTER — Emergency Department: Payer: 59

## 2023-04-13 ENCOUNTER — Emergency Department
Admission: EM | Admit: 2023-04-13 | Discharge: 2023-04-13 | Disposition: A | Discharge status: Home / Self Care | Payer: 59 | Attending: Emergency Medicine | Admitting: Emergency Medicine

## 2023-04-13 DIAGNOSIS — E119 Type 2 diabetes mellitus without complications: Secondary | ICD-10-CM | POA: Diagnosis not present

## 2023-04-13 DIAGNOSIS — J449 Chronic obstructive pulmonary disease, unspecified: Secondary | ICD-10-CM | POA: Diagnosis not present

## 2023-04-13 DIAGNOSIS — I110 Hypertensive heart disease with heart failure: Secondary | ICD-10-CM | POA: Diagnosis not present

## 2023-04-13 DIAGNOSIS — W19XXXA Unspecified fall, initial encounter: Secondary | ICD-10-CM | POA: Insufficient documentation

## 2023-04-13 DIAGNOSIS — S060X0A Concussion without loss of consciousness, initial encounter: Secondary | ICD-10-CM | POA: Diagnosis not present

## 2023-04-13 DIAGNOSIS — S0990XA Unspecified injury of head, initial encounter: Secondary | ICD-10-CM | POA: Diagnosis present

## 2023-04-13 DIAGNOSIS — I509 Heart failure, unspecified: Secondary | ICD-10-CM | POA: Diagnosis not present

## 2023-04-13 DIAGNOSIS — S060X0D Concussion without loss of consciousness, subsequent encounter: Secondary | ICD-10-CM

## 2023-04-13 LAB — CBC
HCT: 32.4 % — ABNORMAL LOW (ref 39.0–52.0)
Hemoglobin: 10.3 g/dL — ABNORMAL LOW (ref 13.0–17.0)
MCH: 29.9 pg (ref 26.0–34.0)
MCHC: 31.8 g/dL (ref 30.0–36.0)
MCV: 94.2 fL (ref 80.0–100.0)
Platelets: 86 10*3/uL — ABNORMAL LOW (ref 150–400)
RBC: 3.44 MIL/uL — ABNORMAL LOW (ref 4.22–5.81)
RDW: 17.6 % — ABNORMAL HIGH (ref 11.5–15.5)
WBC: 2.7 10*3/uL — ABNORMAL LOW (ref 4.0–10.5)
nRBC: 0 % (ref 0.0–0.2)

## 2023-04-13 LAB — BASIC METABOLIC PANEL
Anion gap: 8 (ref 5–15)
BUN: 15 mg/dL (ref 8–23)
CO2: 22 mmol/L (ref 22–32)
Calcium: 8.6 mg/dL — ABNORMAL LOW (ref 8.9–10.3)
Chloride: 112 mmol/L — ABNORMAL HIGH (ref 98–111)
Creatinine, Ser: 1.15 mg/dL (ref 0.61–1.24)
GFR, Estimated: >60 mL/min (ref >60)
Glucose, Bld: 124 mg/dL — ABNORMAL HIGH (ref 70–99)
Potassium: 3.5 mmol/L (ref 3.5–5.1)
Sodium: 142 mmol/L (ref 135–145)

## 2023-04-13 LAB — URINALYSIS, ROUTINE W REFLEX MICROSCOPIC
Bacteria, UA: NONE SEEN
Bilirubin Urine: NEGATIVE
Glucose, UA: NEGATIVE mg/dL
Hgb urine dipstick: NEGATIVE
Ketones, ur: NEGATIVE mg/dL
Leukocytes,Ua: NEGATIVE
Nitrite: NEGATIVE
Protein, ur: 100 mg/dL — AB
Specific Gravity, Urine: 1.028 (ref 1.005–1.030)
pH: 5 (ref 5.0–8.0)

## 2023-04-13 NOTE — ED Triage Notes (Signed)
Pt presents to ED with c/o of having a fall yesterday and was seen and discharged for this. Pt states he doesn't know who he is or why he is here. Pt is able to tell me about his visit yesterday. Pt denies blood thinners.

## 2023-04-13 NOTE — ED Triage Notes (Signed)
Arrived by [facility] with complaints of AMS. Patient reports falling yesterday. EMS picked patient up at [facility] Patient is homeless and EMS reports he was telling people around [employer] he was not sure who he was.
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
## 2023-05-28 DIAGNOSIS — R7881 Bacteremia: Secondary | ICD-10-CM

## 2023-05-28 DIAGNOSIS — B9620 Unspecified Escherichia coli [E. coli] as the cause of diseases classified elsewhere: Secondary | ICD-10-CM

## 2023-05-28 HISTORY — DX: Unspecified Escherichia coli (E. coli) as the cause of diseases classified elsewhere: B96.20

## 2023-05-28 HISTORY — DX: Bacteremia: R78.81

## 2023-06-04 ENCOUNTER — Other Ambulatory Visit: Payer: Self-pay

## 2023-06-04 ENCOUNTER — Emergency Department
Admission: EM | Admit: 2023-06-04 | Discharge: 2023-06-04 | Disposition: A | Discharge status: Home / Self Care | Payer: 59 | Attending: Emergency Medicine | Admitting: Emergency Medicine

## 2023-06-04 ENCOUNTER — Emergency Department: Payer: 59

## 2023-06-04 DIAGNOSIS — R1012 Left upper quadrant pain: Secondary | ICD-10-CM | POA: Insufficient documentation

## 2023-06-04 LAB — CBC WITH DIFFERENTIAL/PLATELET
Abs Immature Granulocytes: 0.01 10*3/uL (ref 0.00–0.07)
Basophils Absolute: 0 10*3/uL (ref 0.0–0.1)
Basophils Relative: 1 %
Eosinophils Absolute: 0.2 10*3/uL (ref 0.0–0.5)
Eosinophils Relative: 4 %
HCT: 36.5 % — ABNORMAL LOW (ref 39.0–52.0)
Hemoglobin: 12.1 g/dL — ABNORMAL LOW (ref 13.0–17.0)
Immature Granulocytes: 0 %
Lymphocytes Relative: 35 %
Lymphs Abs: 1.3 10*3/uL (ref 0.7–4.0)
MCH: 30 pg (ref 26.0–34.0)
MCHC: 33.2 g/dL (ref 30.0–36.0)
MCV: 90.3 fL (ref 80.0–100.0)
Monocytes Absolute: 0.6 10*3/uL (ref 0.1–1.0)
Monocytes Relative: 15 %
Neutro Abs: 1.7 10*3/uL (ref 1.7–7.7)
Neutrophils Relative %: 45 %
Platelets: 78 10*3/uL — ABNORMAL LOW (ref 150–400)
RBC: 4.04 MIL/uL — ABNORMAL LOW (ref 4.22–5.81)
RDW: 16.6 % — ABNORMAL HIGH (ref 11.5–15.5)
Smear Review: NORMAL
WBC: 3.8 10*3/uL — ABNORMAL LOW (ref 4.0–10.5)
nRBC: 0 % (ref 0.0–0.2)

## 2023-06-04 LAB — COMPREHENSIVE METABOLIC PANEL
ALT: 42 U/L (ref 0–44)
AST: 64 U/L — ABNORMAL HIGH (ref 15–41)
Albumin: 2.5 g/dL — ABNORMAL LOW (ref 3.5–5.0)
Alkaline Phosphatase: 88 U/L (ref 38–126)
Anion gap: 8 (ref 5–15)
BUN: 19 mg/dL (ref 8–23)
CO2: 25 mmol/L (ref 22–32)
Calcium: 8.6 mg/dL — ABNORMAL LOW (ref 8.9–10.3)
Chloride: 105 mmol/L (ref 98–111)
Creatinine, Ser: 1.03 mg/dL (ref 0.61–1.24)
GFR, Estimated: >60 mL/min (ref >60)
Glucose, Bld: 69 mg/dL — ABNORMAL LOW (ref 70–99)
Potassium: 3.7 mmol/L (ref 3.5–5.1)
Sodium: 138 mmol/L (ref 135–145)
Total Bilirubin: 2.1 mg/dL — ABNORMAL HIGH (ref 0.3–1.2)
Total Protein: 6.2 g/dL — ABNORMAL LOW (ref 6.5–8.1)

## 2023-06-04 LAB — LIPASE, BLOOD: Lipase: 35 U/L (ref 11–51)

## 2023-06-04 MED ORDER — LIDOCAINE 5 % EX PTCH: 1.0000 | MEDICATED_PATCH | Freq: Two times a day (BID) | CUTANEOUS | 0 refills | Status: AC

## 2023-06-04 MED ORDER — LIDOCAINE 5 % EX PTCH
1.0000 | MEDICATED_PATCH | CUTANEOUS | Status: DC
  Administered 2023-06-04: 1 via TRANSDERMAL
  Filled 2023-06-04: qty 1

## 2023-06-04 NOTE — ED Triage Notes (Signed)
Pt presents to ED and states he has a hernia after appendix surgery. NAD noted.

## 2023-06-04 NOTE — ED Notes (Signed)
See triage notes. Patient c/o a large hernia to the left upper quadrant. Patient stated he has had this issue since they removed his appendix some unknown months ago.

## 2023-06-04 NOTE — ED Provider Notes (Signed)
[facility] Provider Note   Event Date/Time   First MD Initiated Contact with Patient 06/04/23 1929     (approximate) History  Hernia  HPI [name] is a 72 y.o. male with a stated past medical history of laparoscopic appendectomy who presents complaining of pain underlying the left upper abdominal quadrant trocar site.  Patient states that he has pain with coughing, straining, or trying to sit up. ROS: Patient currently denies any vision changes, tinnitus, difficulty speaking, facial droop, sore throat, chest pain, shortness of breath, abdominal pain, nausea/vomiting/diarrhea, dysuria, or weakness/numbness/paresthesias in any extremity   Physical Exam  Triage Vital Signs: ED Triage Vitals  Encounter Vitals Group     BP 06/04/23 1731 (!) 150/83     Systolic BP Percentile --      Diastolic BP Percentile --      Pulse Rate 06/04/23 1731 (!) 50     Resp 06/04/23 1731 19     Temp 06/04/23 1731 99 F (37.2 C)     Temp Source 06/04/23 1731 Oral     SpO2 06/04/23 1731 98 %     Weight 06/04/23 1732 238 lb 1.6 oz (108 kg)     Height 06/04/23 1732 5\' 6"  (1.676 m)     Head Circumference --      Peak Flow --      Pain Score 06/04/23 1732 10     Pain Loc --      Pain Education --      Exclude from Growth Chart --    Most recent vital signs: Vitals:   06/04/23 1731  BP: (!) 150/83  Pulse: (!) 50  Resp: 19  Temp: 99 F (37.2 C)  SpO2: 98%   General: Awake, oriented x4. CV:  Good peripheral perfusion.  Resp:  Normal effort.  Abd:  No distention.  Tenderness to palpation in the left upper quadrant overlying the trocar site scar Other:  Elderly overweight Caucasian male resting comfortably in no acute distress ED Results / Procedures / Treatments  Labs (all labs ordered are listed, but only abnormal results are displayed) Labs Reviewed  COMPREHENSIVE METABOLIC PANEL - Abnormal; Notable for the following components:      Result Value   Glucose, Bld  69 (*)    Calcium 8.6 (*)    Total Protein 6.2 (*)    Albumin 2.5 (*)    AST 64 (*)    Total Bilirubin 2.1 (*)    All other components within normal limits  CBC WITH DIFFERENTIAL/PLATELET - Abnormal; Notable for the following components:   WBC 3.8 (*)    RBC 4.04 (*)    Hemoglobin 12.1 (*)    HCT 36.5 (*)    RDW 16.6 (*)    Platelets 78 (*)    All other components within normal limits  LIPASE, BLOOD   RADIOLOGY ED MD interpretation: CT of the abdomen pelvis without IV contrast shows no evidence of acute abnormalities -Agree with radiology assessment Official radiology report(s): CT ABDOMEN PELVIS WO CONTRAST  Result Date: 06/04/2023 CLINICAL DATA:  Left upper quadrant pain EXAM: CT ABDOMEN AND PELVIS WITHOUT CONTRAST TECHNIQUE: Multidetector CT imaging of the abdomen and pelvis was performed following the standard protocol without IV contrast. RADIATION DOSE REDUCTION: This exam was performed according to the departmental dose-optimization program which includes automated exposure control, adjustment of the mA and/or kV according to patient size and/or use of iterative reconstruction technique. COMPARISON:  CT 04/02/2023, 01/08/2023, 02/17/2020 FINDINGS:  Lower chest: Lung bases demonstrate no acute airspace disease. Tiny pericardial effusion. Hepatobiliary: Liver cirrhosis. Cholecystectomy. Chronically enlarged common bile duct, measures up to 2.8 cm today, waxing and waning diameter compared to multiple prior exams. Pancreas: Unremarkable. No pancreatic ductal dilatation or surrounding inflammatory changes. Spleen: Normal in size without focal abnormality. Adrenals/Urinary Tract: Adrenal glands are within normal limits. Lobulated renal contours. No hydronephrosis. Multiple right kidney stones measuring up to 7 mm. The bladder is unremarkable Stomach/Bowel: Stomach nonenlarged. No dilated small bowel. No acute bowel wall thickening. Nonvisualized appendix Vascular/Lymphatic: Mild aortic  atherosclerosis. No aneurysm. Recanalized umbilical vein with large anterior abdominal collateral vessels. No suspicious lymph nodes Reproductive: Negative for mass Other: Negative for pelvic effusion or free air. Generalized subcutaneous edema. Musculoskeletal: No acute or suspicious osseous abnormality IMPRESSION: 1. No CT evidence for acute intra-abdominal or pelvic abnormality. 2. Liver cirrhosis with portal hypertension. 3. Chronically enlarged common bile duct, measures up to 2.8 cm today, fluctuating diameter compared to multiple prior exams. 4. Nonobstructing right kidney stones. 5. Aortic atherosclerosis. Aortic Atherosclerosis (ICD10-I70.0). Electronically Signed   By: Jasmine Pang M.D.   On: 06/04/2023 21:10   PROCEDURES: Critical Care performed: No Procedures MEDICATIONS ORDERED IN ED: Medications  lidocaine (LIDODERM) 5 % 1 patch (1 patch Transdermal Patch Applied 06/04/23 2128)   IMPRESSION / MDM / ASSESSMENT AND PLAN / ED COURSE  I reviewed the triage vital signs and the nursing notes.                             The patient is on the cardiac monitor to evaluate for evidence of arrhythmia and/or significant heart rate changes. Patient's presentation is most consistent with acute presentation with potential threat to life or bodily function. Patient presents for abdominal pain.  Differential diagnosis includes appendicitis, abdominal aortic aneurysm, surgical biliary disease, pancreatitis, SBO, mesenteric ischemia, serious intra-abdominal bacterial illness, genital torsion. Doubt atypical ACS. Based on history, physical exam, radiologic/laboratory evaluation, there is no red flag results or symptomatology requiring emergent intervention or need for admission at this time Pt tolerating PO. Disposition: Patient will be discharged with strict return precautions and follow up with primary MD within 12-24 hours for further evaluation. Patient understands that this still may have an early  presentation of an emergent medical condition such as appendicitis that will require a recheck.   FINAL CLINICAL IMPRESSION(S) / ED DIAGNOSES   Final diagnoses:  Left upper quadrant abdominal pain   Rx / DC Orders   ED Discharge Orders          Ordered    lidocaine (LIDODERM) 5 %  Every 12 hours        06/04/23 2125           Note:  This document was prepared using Dragon voice recognition software and may include unintentional dictation errors.   Merwyn Katos, MD 06/04/23 [phone]

## 2023-06-04 NOTE — ED Triage Notes (Signed)
Arrives from bus stop at [facility] with left sided abd pain.  Via [facility].  Pain x 1 week.  Worse today over past 40 minutes.  Has been taking Ibuprofen and tylenol for pain with no relief.    Appendectomy several months ago.  VS wnl.

## 2023-06-06 ENCOUNTER — Emergency Department
Admission: EM | Admit: 2023-06-06 | Discharge: 2023-06-06 | Disposition: A | Discharge status: Home / Self Care | Payer: 59 | Attending: Emergency Medicine | Admitting: Emergency Medicine

## 2023-06-06 ENCOUNTER — Emergency Department: Payer: 59

## 2023-06-06 ENCOUNTER — Other Ambulatory Visit: Payer: Self-pay

## 2023-06-06 DIAGNOSIS — I10 Essential (primary) hypertension: Secondary | ICD-10-CM | POA: Insufficient documentation

## 2023-06-06 DIAGNOSIS — E119 Type 2 diabetes mellitus without complications: Secondary | ICD-10-CM | POA: Insufficient documentation

## 2023-06-06 DIAGNOSIS — R079 Chest pain, unspecified: Secondary | ICD-10-CM

## 2023-06-06 DIAGNOSIS — R0789 Other chest pain: Secondary | ICD-10-CM | POA: Diagnosis present

## 2023-06-06 DIAGNOSIS — J449 Chronic obstructive pulmonary disease, unspecified: Secondary | ICD-10-CM | POA: Insufficient documentation

## 2023-06-06 LAB — CBC
HCT: 35 % — ABNORMAL LOW (ref 39.0–52.0)
Hemoglobin: 11.5 g/dL — ABNORMAL LOW (ref 13.0–17.0)
MCH: 29.9 pg (ref 26.0–34.0)
MCHC: 32.9 g/dL (ref 30.0–36.0)
MCV: 90.9 fL (ref 80.0–100.0)
Platelets: 86 10*3/uL — ABNORMAL LOW (ref 150–400)
RBC: 3.85 MIL/uL — ABNORMAL LOW (ref 4.22–5.81)
RDW: 16.8 % — ABNORMAL HIGH (ref 11.5–15.5)
WBC: 2.6 10*3/uL — ABNORMAL LOW (ref 4.0–10.5)
nRBC: 0 % (ref 0.0–0.2)

## 2023-06-06 LAB — BASIC METABOLIC PANEL
Anion gap: 10 (ref 5–15)
BUN: 15 mg/dL (ref 8–23)
CO2: 22 mmol/L (ref 22–32)
Calcium: 8.8 mg/dL — ABNORMAL LOW (ref 8.9–10.3)
Chloride: 108 mmol/L (ref 98–111)
Creatinine, Ser: 1.02 mg/dL (ref 0.61–1.24)
GFR, Estimated: >60 mL/min (ref >60)
Glucose, Bld: 141 mg/dL — ABNORMAL HIGH (ref 70–99)
Potassium: 3.5 mmol/L (ref 3.5–5.1)
Sodium: 140 mmol/L (ref 135–145)

## 2023-06-06 LAB — TROPONIN I (HIGH SENSITIVITY)
Troponin I (High Sensitivity): 33 ng/L — ABNORMAL HIGH (ref <18)
Troponin I (High Sensitivity): 40 ng/L — ABNORMAL HIGH (ref <18)
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
## 2023-06-06 NOTE — ED Triage Notes (Addendum)
Pt arrived via EMS. Pt sts that he is having chest pain however pt sts that if the cops come and get him he is going to kill himself. EMS reports that pt is having a disagreement with family members. Pt denies any SI/HI at this time due to coming to the hospital.

## 2023-06-16 ENCOUNTER — Emergency Department
Admission: EM | Admit: 2023-06-16 | Discharge: 2023-06-16 | Disposition: A | Discharge status: Home / Self Care | Payer: 59 | Source: Home / Self Care | Attending: Emergency Medicine | Admitting: Emergency Medicine

## 2023-06-16 ENCOUNTER — Other Ambulatory Visit: Payer: Self-pay

## 2023-06-16 ENCOUNTER — Emergency Department: Payer: 59

## 2023-06-16 DIAGNOSIS — W19XXXA Unspecified fall, initial encounter: Secondary | ICD-10-CM

## 2023-06-16 DIAGNOSIS — R7881 Bacteremia: Secondary | ICD-10-CM | POA: Diagnosis not present

## 2023-06-16 DIAGNOSIS — G8929 Other chronic pain: Secondary | ICD-10-CM | POA: Insufficient documentation

## 2023-06-16 DIAGNOSIS — M25511 Pain in right shoulder: Secondary | ICD-10-CM | POA: Insufficient documentation

## 2023-06-16 DIAGNOSIS — Z765 Malingerer [conscious simulation]: Secondary | ICD-10-CM | POA: Insufficient documentation

## 2023-06-16 DIAGNOSIS — N390 Urinary tract infection, site not specified: Secondary | ICD-10-CM | POA: Diagnosis not present

## 2023-06-16 DIAGNOSIS — R0789 Other chest pain: Secondary | ICD-10-CM | POA: Insufficient documentation

## 2023-06-16 DIAGNOSIS — W010XXA Fall on same level from slipping, tripping and stumbling without subsequent striking against object, initial encounter: Secondary | ICD-10-CM | POA: Insufficient documentation

## 2023-06-16 DIAGNOSIS — R7309 Other abnormal glucose: Secondary | ICD-10-CM | POA: Insufficient documentation

## 2023-06-16 DIAGNOSIS — Z5900 Homelessness unspecified: Secondary | ICD-10-CM | POA: Insufficient documentation

## 2023-06-16 DIAGNOSIS — M549 Dorsalgia, unspecified: Secondary | ICD-10-CM

## 2023-06-16 LAB — URINALYSIS, ROUTINE W REFLEX MICROSCOPIC
Bilirubin Urine: NEGATIVE
Glucose, UA: NEGATIVE mg/dL
Hgb urine dipstick: NEGATIVE
Ketones, ur: NEGATIVE mg/dL
Leukocytes,Ua: NEGATIVE
Nitrite: NEGATIVE
Protein, ur: NEGATIVE mg/dL
Specific Gravity, Urine: 1.011 (ref 1.005–1.030)
pH: 6 (ref 5.0–8.0)

## 2023-06-16 LAB — CBC WITH DIFFERENTIAL/PLATELET
Abs Immature Granulocytes: 0.01 10*3/uL (ref 0.00–0.07)
Basophils Absolute: 0 10*3/uL (ref 0.0–0.1)
Basophils Relative: 1 %
Eosinophils Absolute: 0.1 10*3/uL (ref 0.0–0.5)
Eosinophils Relative: 1 %
HCT: 38.7 % — ABNORMAL LOW (ref 39.0–52.0)
Hemoglobin: 12.6 g/dL — ABNORMAL LOW (ref 13.0–17.0)
Immature Granulocytes: 0 %
Lymphocytes Relative: 16 %
Lymphs Abs: 0.6 10*3/uL — ABNORMAL LOW (ref 0.7–4.0)
MCH: 30.7 pg (ref 26.0–34.0)
MCHC: 32.6 g/dL (ref 30.0–36.0)
MCV: 94.2 fL (ref 80.0–100.0)
Monocytes Absolute: 0.2 10*3/uL (ref 0.1–1.0)
Monocytes Relative: 7 %
Neutro Abs: 2.6 10*3/uL (ref 1.7–7.7)
Neutrophils Relative %: 75 %
Platelets: 90 10*3/uL — ABNORMAL LOW (ref 150–400)
RBC: 4.11 MIL/uL — ABNORMAL LOW (ref 4.22–5.81)
RDW: 17.3 % — ABNORMAL HIGH (ref 11.5–15.5)
Smear Review: DECREASED
WBC: 3.5 10*3/uL — ABNORMAL LOW (ref 4.0–10.5)
nRBC: 0 % (ref 0.0–0.2)

## 2023-06-16 LAB — PROCALCITONIN: Procalcitonin: <0.1 ng/mL

## 2023-06-16 LAB — LACTIC ACID, PLASMA
Lactic Acid, Venous: 2.7 mmol/L (ref 0.5–1.9)
Lactic Acid, Venous: 2.5 mmol/L (ref 0.5–1.9)
Lactic Acid, Venous: 1.4 mmol/L (ref 0.5–1.9)

## 2023-06-16 LAB — BASIC METABOLIC PANEL
Anion gap: 8 (ref 5–15)
BUN: 16 mg/dL (ref 8–23)
CO2: 24 mmol/L (ref 22–32)
Calcium: 9.6 mg/dL (ref 8.9–10.3)
Chloride: 108 mmol/L (ref 98–111)
Creatinine, Ser: 0.91 mg/dL (ref 0.61–1.24)
GFR, Estimated: >60 mL/min (ref >60)
Glucose, Bld: 121 mg/dL — ABNORMAL HIGH (ref 70–99)
Potassium: 4 mmol/L (ref 3.5–5.1)
Sodium: 140 mmol/L (ref 135–145)

## 2023-06-16 LAB — TROPONIN I (HIGH SENSITIVITY)
Troponin I (High Sensitivity): 22 ng/L — ABNORMAL HIGH (ref <18)
Troponin I (High Sensitivity): 26 ng/L — ABNORMAL HIGH (ref <18)

## 2023-06-16 LAB — CBG MONITORING, ED: Glucose-Capillary: 113 mg/dL — ABNORMAL HIGH (ref 70–99)

## 2023-06-16 MED ORDER — ACETAMINOPHEN 500 MG PO TABS
1000.0000 mg | ORAL_TABLET | Freq: Once | ORAL | Status: AC
  Administered 2023-06-16: 1000 mg via ORAL
  Filled 2023-06-16: qty 2

## 2023-06-16 MED ORDER — TIZANIDINE HCL 2 MG PO CAPS: 2.0000 mg | ORAL_CAPSULE | Freq: Three times a day (TID) | ORAL | 0 refills | Status: DC | PRN

## 2023-06-16 MED ORDER — SODIUM CHLORIDE 0.9 % IV BOLUS
1000.0000 mL | Freq: Once | INTRAVENOUS | Status: AC
  Administered 2023-06-16: 1000 mL via INTRAVENOUS

## 2023-06-16 MED ORDER — CYCLOBENZAPRINE HCL 10 MG PO TABS
5.0000 mg | ORAL_TABLET | Freq: Once | ORAL | Status: AC
  Administered 2023-06-16: 5 mg via ORAL
  Filled 2023-06-16: qty 1

## 2023-06-16 MED ORDER — LACTATED RINGERS IV BOLUS
1000.0000 mL | Freq: Once | INTRAVENOUS | Status: AC
  Administered 2023-06-16: 1000 mL via INTRAVENOUS

## 2023-06-16 NOTE — ED Provider Notes (Signed)
Care of this patient assumed from prior physician at 1500 pending repeat lactate, reevaluation, and disposition. Please see prior physician note for further details.  Briefly this is a 72 year old male who presents after multiple falls.  No obvious injuries noted on initial exam.  Chest x-Carlinda Ohlson without acute findings.  Blood work was ordered from triage which was notable for a slightly elevated lactic acidosis.  It was repeated prior to fluid resuscitation and down trended slightly.  Procalcitonin was ordered and was negative.  No clinical history suggestive of infection, suspicion had been for likely dehydration.  Signed out to me pending IV fluid resuscitation and repeat lactate.  If lactate normalized, plan for discharge.  Repeat lactate did normalize to 1.4.  Remainder of lab work with mild troponin elevation, not significantly changed from prior, stable on repeat.  Stable anemia noted.  Urine without evidence of infection.  Patient was reevaluated.  He reported some tenderness over his right paraspinous musculature that was reproducible on exam. No midline pain.  Had limited improvement with Tylenol, so was ordered for dose of Flexeril.  Discussed results of his workup.  Do think he stable for discharge home.  Will DC with short course of low-dose muscle relaxer, instructed not to drive or operate machinery when taking this.  Strict return precautions provided.  Patient discharged in stable condition.   Trinna Post, MD 06/16/23 [phone]

## 2023-06-16 NOTE — ED Notes (Signed)
Pt up to use restroom, one person assist required, pt unstable on feet. Pt missed the toilet. Urine cleaned off floor, new socks placed on pt.

## 2023-06-16 NOTE — ED Triage Notes (Signed)
Patient states that the was walking on the side walk and had a syncopal episode (he reports pain from his RIGHT shoulder, diagnally across his body to his LEFT knee); He was not able to get himself up and a good samaritan called EMS; He smells strongly of urine in triage

## 2023-06-16 NOTE — Discharge Instructions (Addendum)
You were seen in the ER today for evaluation after fall.  Fortunately we did not find any serious injuries.  Please make sure you are staying hydrated and eating regular meals.  You can take Tylenol as needed to help with your pain.  If you have ongoing pain, I sent a short course of muscle relaxer to your pharmacy.  Do not drive or operate machinery when taking this.  Return to the ER for new or worsening symptoms.

## 2023-06-16 NOTE — ED Provider Notes (Signed)
[facility] Provider Note    Event Date/Time   First MD Initiated Contact with Patient 06/16/23 1349     (approximate)   History   Fall   HPI  [name] is a 72 y.o. male who presents to the emergency department today because of concerns for fall.  The patient states he was walking down the sidewalk when he tripped 3 times.  While he says he does have some difficulty with ambulation in the past this would be unusual form.  He is complaining of pain from his right shoulder down to his left chest.  He denies any recent illness although has felt weak recently.     Physical Exam   Triage Vital Signs: ED Triage Vitals [06/16/23 1224]  Encounter Vitals Group     BP      Systolic BP Percentile      Diastolic BP Percentile      Pulse      Resp      Temp      Temp src      SpO2      Weight      Height      Head Circumference      Peak Flow      Pain Score 10     Pain Loc      Pain Education      Exclude from Growth Chart     Most recent vital signs: There were no vitals filed for this visit.  General: Awake, alert, oriented. CV:  Good peripheral perfusion. Regular rate and rhythm. Resp:  Normal effort. Lungs clear. Abd:  No distention. Non tender.   ED Results / Procedures / Treatments   Labs (all labs ordered are listed, but only abnormal results are displayed) Labs Reviewed  BASIC METABOLIC PANEL - Abnormal; Notable for the following components:      Result Value   Glucose, Bld 121 (*)    All other components within normal limits  LACTIC ACID, PLASMA - Abnormal; Notable for the following components:   Lactic Acid, Venous 2.7 (*)    All other components within normal limits  CBC WITH DIFFERENTIAL/PLATELET - Abnormal; Notable for the following components:   WBC 3.5 (*)    RBC 4.11 (*)    Hemoglobin 12.6 (*)    HCT 38.7 (*)    RDW 17.3 (*)    Platelets 90 (*)    Lymphs Abs 0.6 (*)    All other components within normal limits   CBG MONITORING, ED - Abnormal; Notable for the following components:   Glucose-Capillary 113 (*)    All other components within normal limits  TROPONIN I (HIGH SENSITIVITY) - Abnormal; Notable for the following components:   Troponin I (High Sensitivity) 22 (*)    All other components within normal limits  CULTURE, BLOOD (ROUTINE X 2)  CULTURE, BLOOD (ROUTINE X 2)  LACTIC ACID, PLASMA  URINALYSIS, ROUTINE W REFLEX MICROSCOPIC     EKG  None   RADIOLOGY I independently interpreted and visualized the CXR. My interpretation: No pneumonia Radiology interpretation:  IMPRESSION:  No active cardiopulmonary disease.      PROCEDURES:  Critical Care performed: No  MEDICATIONS ORDERED IN ED: Medications - No data to display   IMPRESSION / MDM / ASSESSMENT AND PLAN / ED COURSE  I reviewed the triage vital signs and the nursing notes.  Differential diagnosis includes, but is not limited to, anemia, dehydration, infection  Patient's presentation is most consistent with acute presentation with potential threat to life or bodily function.   The patient is on the cardiac monitor to evaluate for evidence of arrhythmia and/or significant heart rate changes.  Patient presents to the emergency department today because of concerns for falls.  Blood work was ordered prior to my evaluation from triage which shows a slightly elevated lactic acidosis. Unclear at this time whether it is due to infection or dehydration. Will check procalcitonin and give IV fluids. If procalcitonin is elevated would have higher concern for infection although no clear source at this time. If negative and lactic acid improves with fluid would think more consistent with dehydration.      FINAL CLINICAL IMPRESSION(S) / ED DIAGNOSES   Final diagnoses:  Fall, initial encounter      Note:  This document was prepared using Dragon voice recognition software and may include  unintentional dictation errors.    Phineas Semen, MD 06/16/23 [phone]

## 2023-06-16 NOTE — ED Triage Notes (Signed)
Patient was just discharged from this ED, spun around, and checked back into the ED. He is homeless, he has no complaints, and he was sleeping comfortably with no signs of distress prior to being called for triage.

## 2023-06-16 NOTE — ED Notes (Signed)
Pt using urinal to void. Pt spilt urinal. Pt changed into new scrubs, new socks.
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
## 2023-06-17 ENCOUNTER — Other Ambulatory Visit: Payer: Self-pay

## 2023-06-17 ENCOUNTER — Emergency Department
Admission: EM | Admit: 2023-06-17 | Discharge: 2023-06-17 | Disposition: A | Discharge status: Home / Self Care | Payer: 59 | Source: Home / Self Care | Attending: Emergency Medicine | Admitting: Emergency Medicine

## 2023-06-17 ENCOUNTER — Inpatient Hospital Stay
Admission: EM | Admit: 2023-06-17 | Discharge: 2023-06-21 | DRG: 690 | Disposition: A | Discharge status: Home Health | Payer: 59 | Attending: Internal Medicine | Admitting: Internal Medicine

## 2023-06-17 ENCOUNTER — Emergency Department: Payer: 59

## 2023-06-17 ENCOUNTER — Telehealth: Payer: Self-pay | Admitting: Emergency Medicine

## 2023-06-17 DIAGNOSIS — E872 Acidosis, unspecified: Secondary | ICD-10-CM | POA: Diagnosis present

## 2023-06-17 DIAGNOSIS — Z885 Allergy status to narcotic agent status: Secondary | ICD-10-CM

## 2023-06-17 DIAGNOSIS — I5032 Chronic diastolic (congestive) heart failure: Secondary | ICD-10-CM | POA: Diagnosis present

## 2023-06-17 DIAGNOSIS — D61818 Other pancytopenia: Secondary | ICD-10-CM | POA: Diagnosis present

## 2023-06-17 DIAGNOSIS — Z5902 Unsheltered homelessness: Secondary | ICD-10-CM | POA: Diagnosis not present

## 2023-06-17 DIAGNOSIS — M5441 Lumbago with sciatica, right side: Secondary | ICD-10-CM | POA: Diagnosis present

## 2023-06-17 DIAGNOSIS — Z882 Allergy status to sulfonamides status: Secondary | ICD-10-CM

## 2023-06-17 DIAGNOSIS — W010XXA Fall on same level from slipping, tripping and stumbling without subsequent striking against object, initial encounter: Secondary | ICD-10-CM | POA: Diagnosis present

## 2023-06-17 DIAGNOSIS — K7460 Unspecified cirrhosis of liver: Secondary | ICD-10-CM | POA: Diagnosis present

## 2023-06-17 DIAGNOSIS — E669 Obesity, unspecified: Secondary | ICD-10-CM | POA: Diagnosis present

## 2023-06-17 DIAGNOSIS — N390 Urinary tract infection, site not specified: Secondary | ICD-10-CM | POA: Diagnosis present

## 2023-06-17 DIAGNOSIS — I110 Hypertensive heart disease with heart failure: Secondary | ICD-10-CM | POA: Diagnosis present

## 2023-06-17 DIAGNOSIS — Z91030 Bee allergy status: Secondary | ICD-10-CM

## 2023-06-17 DIAGNOSIS — Z23 Encounter for immunization: Secondary | ICD-10-CM | POA: Diagnosis present

## 2023-06-17 DIAGNOSIS — E785 Hyperlipidemia, unspecified: Secondary | ICD-10-CM | POA: Diagnosis present

## 2023-06-17 DIAGNOSIS — Z8616 Personal history of COVID-19: Secondary | ICD-10-CM

## 2023-06-17 DIAGNOSIS — Z87892 Personal history of anaphylaxis: Secondary | ICD-10-CM

## 2023-06-17 DIAGNOSIS — R188 Other ascites: Secondary | ICD-10-CM | POA: Diagnosis not present

## 2023-06-17 DIAGNOSIS — Z79899 Other long term (current) drug therapy: Secondary | ICD-10-CM

## 2023-06-17 DIAGNOSIS — K7682 Hepatic encephalopathy: Secondary | ICD-10-CM | POA: Diagnosis present

## 2023-06-17 DIAGNOSIS — I2489 Other forms of acute ischemic heart disease: Secondary | ICD-10-CM | POA: Diagnosis present

## 2023-06-17 DIAGNOSIS — Z6835 Body mass index (BMI) 35.0-35.9, adult: Secondary | ICD-10-CM | POA: Diagnosis not present

## 2023-06-17 DIAGNOSIS — K7469 Other cirrhosis of liver: Secondary | ICD-10-CM | POA: Diagnosis not present

## 2023-06-17 DIAGNOSIS — Z5900 Homelessness unspecified: Secondary | ICD-10-CM

## 2023-06-17 DIAGNOSIS — G4730 Sleep apnea, unspecified: Secondary | ICD-10-CM | POA: Diagnosis present

## 2023-06-17 DIAGNOSIS — G9529 Other cord compression: Secondary | ICD-10-CM | POA: Diagnosis present

## 2023-06-17 DIAGNOSIS — N400 Enlarged prostate without lower urinary tract symptoms: Secondary | ICD-10-CM | POA: Diagnosis present

## 2023-06-17 DIAGNOSIS — G992 Myelopathy in diseases classified elsewhere: Secondary | ICD-10-CM | POA: Diagnosis present

## 2023-06-17 DIAGNOSIS — N3000 Acute cystitis without hematuria: Secondary | ICD-10-CM

## 2023-06-17 DIAGNOSIS — R296 Repeated falls: Secondary | ICD-10-CM | POA: Diagnosis present

## 2023-06-17 DIAGNOSIS — Z7984 Long term (current) use of oral hypoglycemic drugs: Secondary | ICD-10-CM

## 2023-06-17 DIAGNOSIS — M5442 Lumbago with sciatica, left side: Secondary | ICD-10-CM | POA: Diagnosis present

## 2023-06-17 DIAGNOSIS — R7881 Bacteremia: Principal | ICD-10-CM | POA: Diagnosis present

## 2023-06-17 DIAGNOSIS — Z8249 Family history of ischemic heart disease and other diseases of the circulatory system: Secondary | ICD-10-CM

## 2023-06-17 DIAGNOSIS — Z6832 Body mass index (BMI) 32.0-32.9, adult: Secondary | ICD-10-CM

## 2023-06-17 DIAGNOSIS — E860 Dehydration: Secondary | ICD-10-CM | POA: Diagnosis present

## 2023-06-17 DIAGNOSIS — Z765 Malingerer [conscious simulation]: Secondary | ICD-10-CM

## 2023-06-17 DIAGNOSIS — B9620 Unspecified Escherichia coli [E. coli] as the cause of diseases classified elsewhere: Secondary | ICD-10-CM | POA: Diagnosis present

## 2023-06-17 DIAGNOSIS — Z87891 Personal history of nicotine dependence: Secondary | ICD-10-CM

## 2023-06-17 DIAGNOSIS — M4802 Spinal stenosis, cervical region: Secondary | ICD-10-CM | POA: Diagnosis present

## 2023-06-17 DIAGNOSIS — E1169 Type 2 diabetes mellitus with other specified complication: Secondary | ICD-10-CM

## 2023-06-17 DIAGNOSIS — I10 Essential (primary) hypertension: Secondary | ICD-10-CM | POA: Diagnosis not present

## 2023-06-17 DIAGNOSIS — E119 Type 2 diabetes mellitus without complications: Secondary | ICD-10-CM | POA: Diagnosis present

## 2023-06-17 DIAGNOSIS — F332 Major depressive disorder, 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 psychotic features: Secondary | ICD-10-CM | POA: Diagnosis present

## 2023-06-17 DIAGNOSIS — R2689 Other abnormalities of gait and mobility: Secondary | ICD-10-CM | POA: Diagnosis present

## 2023-06-17 DIAGNOSIS — Z7982 Long term (current) use of aspirin: Secondary | ICD-10-CM

## 2023-06-17 DIAGNOSIS — Z9049 Acquired absence of other specified parts of digestive tract: Secondary | ICD-10-CM

## 2023-06-17 DIAGNOSIS — R531 Weakness: Secondary | ICD-10-CM | POA: Diagnosis not present

## 2023-06-17 DIAGNOSIS — M5412 Radiculopathy, cervical region: Secondary | ICD-10-CM | POA: Diagnosis present

## 2023-06-17 DIAGNOSIS — G959 Disease of spinal cord, unspecified: Secondary | ICD-10-CM | POA: Diagnosis not present

## 2023-06-17 DIAGNOSIS — G8929 Other chronic pain: Secondary | ICD-10-CM

## 2023-06-17 DIAGNOSIS — J4489 Other specified chronic obstructive pulmonary disease: Secondary | ICD-10-CM | POA: Diagnosis present

## 2023-06-17 LAB — URINALYSIS, W/ REFLEX TO CULTURE (INFECTION SUSPECTED)
Bacteria, UA: NONE SEEN
Bilirubin Urine: NEGATIVE
Glucose, UA: NEGATIVE mg/dL
Hgb urine dipstick: NEGATIVE
Ketones, ur: 5 mg/dL — AB
Leukocytes,Ua: NEGATIVE
Nitrite: NEGATIVE
Protein, ur: NEGATIVE mg/dL
Specific Gravity, Urine: 1.012 (ref 1.005–1.030)
pH: 7 (ref 5.0–8.0)

## 2023-06-17 LAB — COMPREHENSIVE METABOLIC PANEL
ALT: 41 U/L (ref 0–44)
AST: 63 U/L — ABNORMAL HIGH (ref 15–41)
Albumin: 3 g/dL — ABNORMAL LOW (ref 3.5–5.0)
Alkaline Phosphatase: 80 U/L (ref 38–126)
Anion gap: 8 (ref 5–15)
BUN: 20 mg/dL (ref 8–23)
CO2: 24 mmol/L (ref 22–32)
Calcium: 9.6 mg/dL (ref 8.9–10.3)
Chloride: 109 mmol/L (ref 98–111)
Creatinine, Ser: 0.97 mg/dL (ref 0.61–1.24)
GFR, Estimated: >60 mL/min (ref >60)
Glucose, Bld: 64 mg/dL — ABNORMAL LOW (ref 70–99)
Potassium: 4 mmol/L (ref 3.5–5.1)
Sodium: 141 mmol/L (ref 135–145)
Total Bilirubin: 2.3 mg/dL — ABNORMAL HIGH (ref 0.3–1.2)
Total Protein: 7.2 g/dL (ref 6.5–8.1)

## 2023-06-17 LAB — CBC WITH DIFFERENTIAL/PLATELET
Abs Immature Granulocytes: 0.01 10*3/uL (ref 0.00–0.07)
Basophils Absolute: 0 10*3/uL (ref 0.0–0.1)
Basophils Relative: 1 %
Eosinophils Absolute: 0.1 10*3/uL (ref 0.0–0.5)
Eosinophils Relative: 2 %
HCT: 36.8 % — ABNORMAL LOW (ref 39.0–52.0)
Hemoglobin: 12.2 g/dL — ABNORMAL LOW (ref 13.0–17.0)
Immature Granulocytes: 0 %
Lymphocytes Relative: 26 %
Lymphs Abs: 0.9 10*3/uL (ref 0.7–4.0)
MCH: 30.7 pg (ref 26.0–34.0)
MCHC: 33.2 g/dL (ref 30.0–36.0)
MCV: 92.5 fL (ref 80.0–100.0)
Monocytes Absolute: 0.4 10*3/uL (ref 0.1–1.0)
Monocytes Relative: 13 %
Neutro Abs: 2 10*3/uL (ref 1.7–7.7)
Neutrophils Relative %: 58 %
Platelets: 101 10*3/uL — ABNORMAL LOW (ref 150–400)
RBC: 3.98 MIL/uL — ABNORMAL LOW (ref 4.22–5.81)
RDW: 17.8 % — ABNORMAL HIGH (ref 11.5–15.5)
WBC: 3.4 10*3/uL — ABNORMAL LOW (ref 4.0–10.5)
nRBC: 0 % (ref 0.0–0.2)

## 2023-06-17 LAB — BLOOD CULTURE ID PANEL (REFLEXED) - BCID2

## 2023-06-17 LAB — LACTIC ACID, PLASMA: Lactic Acid, Venous: 1.7 mmol/L (ref 0.5–1.9)

## 2023-06-17 MED ORDER — ATORVASTATIN CALCIUM 20 MG PO TABS
40.0000 mg | ORAL_TABLET | Freq: Every day | ORAL | Status: DC
  Administered 2023-06-18 – 2023-06-21 (×4): 40 mg via ORAL
  Filled 2023-06-17 (×4): qty 2

## 2023-06-17 MED ORDER — SODIUM CHLORIDE 0.9 % IV BOLUS
1000.0000 mL | Freq: Once | INTRAVENOUS | Status: AC
  Administered 2023-06-17: 1000 mL via INTRAVENOUS

## 2023-06-17 MED ORDER — KETOROLAC TROMETHAMINE 15 MG/ML IJ SOLN
15.0000 mg | Freq: Once | INTRAMUSCULAR | Status: AC
  Administered 2023-06-17: 15 mg via INTRAVENOUS
  Filled 2023-06-17: qty 1

## 2023-06-17 MED ORDER — PANTOPRAZOLE SODIUM 40 MG PO TBEC
40.0000 mg | DELAYED_RELEASE_TABLET | Freq: Every day | ORAL | Status: DC
  Administered 2023-06-18 – 2023-06-21 (×4): 40 mg via ORAL
  Filled 2023-06-17 (×4): qty 1

## 2023-06-17 MED ORDER — SENNOSIDES-DOCUSATE SODIUM 8.6-50 MG PO TABS: 1.0000 | ORAL_TABLET | Freq: Every evening | ORAL | Status: DC | PRN

## 2023-06-17 MED ORDER — PIPERACILLIN-TAZOBACTAM 3.375 G IVPB 30 MIN: 3.3750 g | Freq: Once | INTRAVENOUS | Status: DC

## 2023-06-17 MED ORDER — ENOXAPARIN SODIUM 60 MG/0.6ML IJ SOSY
50.0000 mg | PREFILLED_SYRINGE | INTRAMUSCULAR | Status: DC
Start: 2023-06-17 — End: 2023-06-21
  Administered 2023-06-18 – 2023-06-21 (×4): 50 mg via SUBCUTANEOUS
  Filled 2023-06-17 (×4): qty 0.6

## 2023-06-17 MED ORDER — LACTATED RINGERS IV SOLN: INTRAVENOUS | Status: DC

## 2023-06-17 MED ORDER — INSULIN ASPART 100 UNIT/ML IJ SOLN
0.0000 [IU] | Freq: Three times a day (TID) | INTRAMUSCULAR | Status: DC
  Administered 2023-06-18: 1 [IU] via SUBCUTANEOUS
  Filled 2023-06-17: qty 1

## 2023-06-17 MED ORDER — ONDANSETRON HCL 4 MG PO TABS: 4.0000 mg | ORAL_TABLET | Freq: Four times a day (QID) | ORAL | Status: DC | PRN

## 2023-06-17 MED ORDER — CITALOPRAM HYDROBROMIDE 20 MG PO TABS
20.0000 mg | ORAL_TABLET | Freq: Every day | ORAL | Status: DC
  Administered 2023-06-18 – 2023-06-21 (×4): 20 mg via ORAL
  Filled 2023-06-17 (×4): qty 1

## 2023-06-17 MED ORDER — ASPIRIN 81 MG PO TBEC
81.0000 mg | DELAYED_RELEASE_TABLET | Freq: Every day | ORAL | Status: DC
  Administered 2023-06-18 – 2023-06-21 (×4): 81 mg via ORAL
  Filled 2023-06-17 (×4): qty 1

## 2023-06-17 MED ORDER — QUETIAPINE FUMARATE 25 MG PO TABS
50.0000 mg | ORAL_TABLET | Freq: Every day | ORAL | Status: DC
  Administered 2023-06-18 – 2023-06-20 (×4): 50 mg via ORAL
  Filled 2023-06-17 (×4): qty 2

## 2023-06-17 MED ORDER — SODIUM CHLORIDE 0.9 % IV SOLN
2.0000 g | INTRAVENOUS | Status: DC
  Administered 2023-06-17 – 2023-06-20 (×4): 2 g via INTRAVENOUS
  Filled 2023-06-17 (×4): qty 20

## 2023-06-17 NOTE — ED Notes (Signed)
Pt given meal tray and drink at this time 

## 2023-06-17 NOTE — Progress Notes (Signed)
PHARMACIST - PHYSICIAN COMMUNICATION  CONCERNING:  Enoxaparin (Lovenox) for DVT Prophylaxis    RECOMMENDATION: Patient was prescribed enoxaprin 40mg  q24 hours for VTE prophylaxis.   There were no vitals filed for this visit.  There is no height or weight on file to calculate BMI.  Estimated Creatinine Clearance: 80.1 mL/min (by C-G formula based on SCr of 0.91 mg/dL).   Based on [facility] policy patient is candidate for enoxaparin 0.5mg /kg TBW SQ every 24 hours based on BMI being >30.   DESCRIPTION: Pharmacy has adjusted enoxaparin dose per [facility] policy.  Patient is now receiving enoxaparin 50 mg every 24 hours    Jaynie Bream, PharmD Clinical Pharmacist  06/17/2023 5:39 PM

## 2023-06-17 NOTE — ED Triage Notes (Addendum)
Pt comes via EMs from side of road. Pt was falling and found by bystanders on side of road. Pt wasn't able to get up. Pt was seen here in ED for same yesterday. Pt states back pain and it is hurting really bad. Pt states he can't wait here all day.

## 2023-06-17 NOTE — ED Notes (Signed)
Five unsuccessful attempts to gain IV access by two ED staff. IV team consult placed.

## 2023-06-17 NOTE — ED Provider Notes (Signed)
[facility] Provider Note    Event Date/Time   First MD Initiated Contact with Patient 06/17/23 0036     (approximate)   History   Homeless   HPI [name] is a 72 y.o. male who is well-known to the emergency department with frequent visits for his chronic medical issues but also due to his homelessness and malingering.  He presents stating that he cannot walk and that he needs a place to sleep.  He was just discharged within the last couple of hours from the emergency department after being medically cleared for similar issues.  According to the triage nurse, he went out to the lobby and turned back around, check back in, and sat down in the chairs and went back to sleep.  The patient claims that he cannot walk.  When I ask him if he is here because he does not have a place to stay, he said "that too".  He has no other complaints.     Physical Exam   Triage Vital Signs: ED Triage Vitals  Encounter Vitals Group     BP 06/16/23 2243 134/70     Systolic BP Percentile --      Diastolic BP Percentile --      Pulse Rate 06/16/23 2243 (!) 55     Resp 06/16/23 2243 (!) 21     Temp 06/16/23 2243 98.2 F (36.8 C)     Temp Source 06/16/23 2243 Oral     SpO2 06/16/23 2243 100 %     Weight 06/16/23 2242 100 kg (220 lb 7.4 oz)     Height 06/16/23 2242 1.676 m (5\' 6" )     Head Circumference --      Peak Flow --      Pain Score 06/16/23 2242 Asleep     Pain Loc --      Pain Education --      Exclude from Growth Chart --     Most recent vital signs: Vitals:   06/16/23 2243  BP: 134/70  Pulse: (!) 55  Resp: (!) 21  Temp: 98.2 F (36.8 C)  SpO2: 100%    General: Awake, no distress.  Disheveled but at his baseline based on prior visits. CV:  Good peripheral perfusion.  Resp:  Normal effort. Speaking easily and comfortably, no accessory muscle usage nor intercostal retractions.   Abd:  No distention.  Other:  Patient stood up out of his wheelchair  and walked without requiring assistance when told he needed to leave.   ED Results / Procedures / Treatments   Labs (all labs ordered are listed, but only abnormal results are displayed) Labs Reviewed - No data to display   PROCEDURES:  Critical Care performed: No  Procedures    IMPRESSION / MDM / ASSESSMENT AND PLAN / ED COURSE  I reviewed the triage vital signs and the nursing notes.                              Differential diagnosis includes, but is not limited to, malingering, homelessness, arthritis.  Patient's presentation is most consistent with acute, uncomplicated illness.  Labs/studies ordered: None  Interventions/Medications given:  Medications - No data to display None (Note:  hospital course my include additional interventions and/or labs/studies not listed above.)   I reviewed the patient's last visit and see that he left just a few hours ago.  I explained to him  that he cannot stay in the emergency department despite not having a place to go and that he knows this based on prior conversations.  When I explained this to him, he stood up out of his wheelchair and walked across the department and out the door without requiring assistance and without any apparent difficulty.  He is medically cleared and we have asked security to escort him from the property due to his malingering.  I provided paperwork that includes a list of homeless shelters.         FINAL CLINICAL IMPRESSION(S) / ED DIAGNOSES   Final diagnoses:  Other chronic pain  Malingering  Homelessness     Rx / DC Orders   ED Discharge Orders     None        Note:  This document was prepared using Dragon voice recognition software and may include unintentional dictation errors.   Loleta Rose, MD 06/17/23 [phone]

## 2023-06-17 NOTE — Discharge Instructions (Addendum)
Please follow up with your regular provider.

## 2023-06-17 NOTE — H&P (Addendum)
History and Physical    Patient: [name] DOB: 1951-03-21 DOA: 06/17/2023 DOS: the patient was seen and examined on 06/17/2023 PCP: [name], MD  Patient coming from:  Homeless  Chief Complaint: Dysuria.  HPI: [name] is a 72 y.o. male with medical history significant of chronic diastolic congestive heart failure, COPD, type 2 diabetes, essential hypertension, obesity, sleep apnea not on CPAP, homelessness who lives on the street who came to the hospital with complaints of dysuria, was found to have E. coli bacteremia.  Patient currently lives on the [address], he came to the emergency room yesterday, a blood culture was drawn, came back positive for E. coli, patient was called back to the hospital for admission. Patient has a complaining of significant dysuria, frequent urination every 1 hour.  He also had urinary incontinence. Additionally, patient has been feeling dizzy and lightheaded, he has been falling.  He does not have any focal weakness.  Upon arriving the hospital, patient received 1 L of IV fluids.  Started on antibiotics.  Review of Systems: As mentioned in the history of present illness. All other systems reviewed and are negative. Past Medical History:  Diagnosis Date   Asthma    CHF (congestive heart failure) (HCC)    COPD (chronic obstructive pulmonary disease) (HCC)    COVID-19 03/2020   diagnosed in August 2021   Diabetes mellitus without complication (HCC)    Homelessness    Hypertension    Infestation by bed bug    Migraine    Obesity    Sleep apnea    Past Surgical History:  Procedure Laterality Date   CARDIAC CATHETERIZATION     CHOLECYSTECTOMY     EYE SURGERY     INNER EAR SURGERY     NOSE SURGERY     XI ROBOTIC LAPAROSCOPIC ASSISTED APPENDECTOMY N/A 11/05/2022   Procedure: XI ROBOTIC LAPAROSCOPIC ASSISTED APPENDECTOMY;  Surgeon: Carolan Shiver, MD;  Location: [facility] ORS;  Service: General;  Laterality: N/A;    Social History:  reports that he has quit smoking. He has never used smokeless tobacco. He reports that he does not drink alcohol and does not use drugs.  Allergies  Allergen Reactions   Bee Venom Anaphylaxis   Codeine Itching    Only itching. Recently allergy tested at [facility]   Lactulose Diarrhea    Patient states causes diarrhea   Novocain [Procaine]    Sulfa Antibiotics Other (See Comments)    Family History  Problem Relation Age of Onset   Hypertension Mother    Heart disease Mother    Heart failure Maternal Grandmother     Prior to Admission medications   Medication Sig Start Date End Date Taking? Authorizing Provider  ascorbic acid (VITAMIN C) 500 MG tablet Take 1 tablet (500 mg total) by mouth daily. Patient not taking: Reported on 03/23/2023 04/20/20   Standley Brooking, MD  aspirin EC 81 MG tablet Take 1 tablet (81 mg total) by mouth daily. Swallow whole. 10/04/22   Sarina Ill, DO  atorvastatin (LIPITOR) 40 MG tablet Take 1 tablet (40 mg total) by mouth daily. 01/09/23 01/09/24  Delton Prairie, MD  citalopram (CELEXA) 20 MG tablet Take 1 tablet (20 mg total) by mouth daily. 01/09/23 01/09/24  Delton Prairie, MD  doxepin (SINEQUAN) 50 MG capsule Take 1 capsule (50 mg total) by mouth at bedtime. 01/09/23   Delton Prairie, MD  empagliflozin (JARDIANCE) 10 MG TABS tablet Take 1 tablet (10 mg total) by  mouth daily before breakfast. 01/09/23   Delton Prairie, MD  furosemide (LASIX) 40 MG tablet Take 1.5 tablets (60 mg total) by mouth 2 (two) times daily for 5 days, THEN 1 tablet (40 mg total) daily. 01/17/23 01/17/24  Sabharwal, Aditya, DO  metFORMIN (GLUCOPHAGE) 500 MG tablet Take 500 mg by mouth daily. Patient not taking: Reported on 01/16/2023 10/10/22   [provider]  Multiple Vitamin (MULTIVITAMIN WITH MINERALS) TABS tablet Take 1 tablet by mouth daily. Patient not taking: Reported on 01/16/2023    [provider]  nitroGLYCERIN (NITROSTAT) 0.4 MG SL tablet Place  1 tablet (0.4 mg total) under the tongue every 5 (five) minutes x 3 doses as needed for chest pain. Patient not taking: Reported on 01/16/2023 10/04/22   Sarina Ill, DO  ondansetron (ZOFRAN) 4 MG tablet Take 1 tablet (4 mg total) by mouth every 6 (six) hours as needed for nausea. 03/28/23   Loyce Dys, MD  pantoprazole (PROTONIX) 40 MG tablet Take 1 tablet (40 mg total) by mouth daily. 01/09/23 01/09/24  Delton Prairie, MD  QUEtiapine (SEROQUEL) 50 MG tablet Take 1 tablet (50 mg total) by mouth at bedtime. 01/09/23   Delton Prairie, MD  rifaximin (XIFAXAN) 550 MG TABS tablet Take 1 tablet (550 mg total) by mouth 2 (two) times daily. Patient not taking: Reported on 01/16/2023 12/27/22   Enedina Finner, MD  senna-docusate (SENOKOT-S) 8.6-50 MG tablet Take 1 tablet by mouth at bedtime as needed for mild constipation. 03/28/23   Loyce Dys, MD  spironolactone (ALDACTONE) 25 MG tablet Take 0.5 tablets (12.5 mg total) by mouth daily. 03/28/23 06/26/23  Loyce Dys, MD  tizanidine (ZANAFLEX) 2 MG capsule Take 1 capsule (2 mg total) by mouth 3 (three) times daily as needed for up to 3 days for muscle spasms. 06/16/23 06/19/23  Trinna Post, MD    Physical Exam: Vitals:   06/17/23 1258  BP: (!) 101/55  Pulse: 61  Resp: 15  Temp: 99.1 F (37.3 C)  TempSrc: Oral  SpO2: 95%   Physical Exam Constitutional:      General: He is not in acute distress.    Appearance: He is obese. He is not toxic-appearing.  HENT:     Head: Normocephalic and atraumatic.     Nose: Nose normal. No congestion.  Eyes:     Extraocular Movements: Extraocular movements intact.     Conjunctiva/sclera: Conjunctivae normal.     Pupils: Pupils are equal, round, and reactive to light.  Cardiovascular:     Rate and Rhythm: Normal rate and regular rhythm.     Heart sounds: No murmur heard.    No gallop.  Pulmonary:     Effort: Pulmonary effort is normal. No respiratory distress.     Breath sounds: Normal breath sounds. No  wheezing or rales.  Abdominal:     General: Abdomen is flat. Bowel sounds are normal.     Palpations: Abdomen is soft.     Tenderness: There is no abdominal tenderness.  Musculoskeletal:        General: No swelling or tenderness. Normal range of motion.     Cervical back: Normal range of motion and neck supple. No rigidity.  Lymphadenopathy:     Cervical: No cervical adenopathy.  Skin:    General: Skin is warm and dry.     Coloration: Skin is not jaundiced.  Neurological:     General: No focal deficit present.     Mental Status: He is  alert and oriented to person, place, and time.     Cranial Nerves: No cranial nerve deficit.     Sensory: No sensory deficit.  Psychiatric:        Mood and Affect: Mood normal.        Thought Content: Thought content normal.     Data Reviewed:  CT head did not show any acute changes, showed small vessel disease.  CT cervical spine showed diffuse degenerative changes without cervical stenosis. Elevated lactic acid 2.7, dropped down to 1.4.  Troponin 40, dropped down to 22, white cell 3.5, platelets 90.  Assessment and Plan: E. coli bacteremia. Urinary tract infection. Benign prostatic hypertrophy. Patient did not meet sepsis criteria, blood culture performed yesterday come back with E. coli in 1 bottle.  Will send urine culture before antibiotics.  Repeat blood culture was performed before antibiotics. Will start Rocephin 2 g every 24 hours.  Dizziness with frequent falls. Lactic acidosis secondary to dehydration. Elevated troponin secondary to demand ischemia. Patient appeared to be dehydrated due to homelessness with limited fluid intake.  Obtain orthostatic vital signs. Continue some fluids with LR.  Liver cirrhosis. Pancytopenia. Patient had a history of liver cirrhosis, does not appear to have ascites.  Patient also has leukopenia and thrombocytopenia.  Continue to follow.  Chronic diastolic congestive heart failure No  exacerbation.  COPD. No bronchospasm.  Essential hypertension, Blood pressure not significant elevated  Obesity with BMI 35.58. Eat healthy and exercise.  Type 2 diabetes  Sliding scale insulin.  Homelessness  TOC consulted.     Advance Care Planning:   Code Status: Full Code patient has not made a living will, as a result, he is a full code.  Consults: None  Family Communication: None  Severity of Illness: The appropriate patient status for this patient is INPATIENT. Inpatient status is judged to be reasonable and necessary in order to provide the required intensity of service to ensure the patient's safety. The patient's presenting symptoms, physical exam findings, and initial radiographic and laboratory data in the context of their chronic comorbidities is felt to place them at high risk for further clinical deterioration. Furthermore, it is not anticipated that the patient will be medically stable for discharge from the hospital within 2 midnights of admission.   * I certify that at the point of admission it is my clinical judgment that the patient will require inpatient hospital care spanning beyond 2 midnights from the point of admission due to high intensity of service, high risk for further deterioration and high frequency of surveillance required.*  Author: Marrion Coy, MD 06/17/2023 5:36 PM  For on call review [email or web address].

## 2023-06-17 NOTE — ED Provider Notes (Signed)
[facility] Provider Note   Event Date/Time   First MD Initiated Contact with Patient 06/17/23 1639     (approximate) History  No chief complaint on file.  HPI [name] is a 72 y.o. male with a past medical history of hypertension, major depressive disorder, cirrhosis, type 2 diabetes, and COPD who presents after EMS found patient with ambulatory dysfunction outside of a gas station earlier today.  Patient was reportedly falling and family balance centers on the side of the road who attempted to get him up but he could does not stand on his own.  Patient states that it is due to chronic back pain that is worse since yesterday.  Of note, patient had blood cultures drawn recently that have just become positive for E. coli. ROS: Patient currently denies any vision changes, tinnitus, difficulty speaking, facial droop, sore throat, chest pain, shortness of breath, abdominal pain, nausea/vomiting/diarrhea, dysuria, or weakness/numbness/paresthesias in any extremity   Physical Exam  Triage Vital Signs: ED Triage Vitals  Encounter Vitals Group     BP 06/17/23 1258 (!) 101/55     Systolic BP Percentile --      Diastolic BP Percentile --      Pulse Rate 06/17/23 1258 61     Resp 06/17/23 1258 15     Temp 06/17/23 1258 99.1 F (37.3 C)     Temp Source 06/17/23 1258 Oral     SpO2 06/17/23 1258 95 %     Weight --      Height --      Head Circumference --      Peak Flow --      Pain Score 06/17/23 1248 10     Pain Loc --      Pain Education --      Exclude from Growth Chart --    Most recent vital signs: Vitals:   06/17/23 1258  BP: (!) 101/55  Pulse: 61  Resp: 15  Temp: 99.1 F (37.3 C)  SpO2: 95%   General: Awake, oriented x4. CV:  Good peripheral perfusion.  Resp:  Normal effort.  Abd:  No distention.  Other:  Elderly overweight Caucasian male resting comfortably in no acute distress ED Results / Procedures / Treatments  Labs (all labs ordered  are listed, but only abnormal results are displayed) Labs Reviewed  URINALYSIS, W/ REFLEX TO CULTURE (INFECTION SUSPECTED) - Abnormal; Notable for the following components:      Result Value   Color, Urine YELLOW (*)    APPearance CLEAR (*)    Ketones, ur 5 (*)    All other components within normal limits  CULTURE, BLOOD (ROUTINE X 2)  CULTURE, BLOOD (ROUTINE X 2)  COMPREHENSIVE METABOLIC PANEL  CBC WITH DIFFERENTIAL/PLATELET  LACTIC ACID, PLASMA  BASIC METABOLIC PANEL  CBC  MAGNESIUM  PHOSPHORUS   RADIOLOGY ED MD interpretation: CT of the cervical spine interpreted by me does not show any evidence of acute abnormalities including no acute fracture, malalignment, height loss, or dislocation  CT of the head without contrast interpreted by me shows no evidence of acute abnormalities including no intracerebral hemorrhage, obvious masses, or significant edema -Agree with radiology assessment Official radiology report(s): CT Cervical Spine Wo Contrast  Result Date: 06/17/2023 CLINICAL DATA:  fall EXAM: CT CERVICAL SPINE WITHOUT CONTRAST TECHNIQUE: Multidetector CT imaging of the cervical spine was performed without intravenous contrast. Multiplanar CT image reconstructions were also generated. RADIATION DOSE REDUCTION: This exam was performed according to  the departmental dose-optimization program which includes automated exposure control, adjustment of the mA and/or kV according to patient size and/or use of iterative reconstruction technique. COMPARISON:  Cervical spine CT 04/12/2023 FINDINGS: Alignment: Normal. Skull base and vertebrae: No acute fracture. Vertebral body heights are maintained. The dens and skull base are intact. Soft tissues and spinal canal: No prevertebral fluid or swelling. No visible canal hematoma. Disc levels: Diffuse degenerative disc disease, without significant interval change. Upper chest: Nonacute. Other: None. IMPRESSION: 1. No acute fracture or subluxation of  the cervical spine. 2. Diffuse degenerative disc disease, without significant interval change. Electronically Signed   By: Narda Rutherford M.D.   On: 06/17/2023 16:15   CT HEAD WO CONTRAST ( )  Result Date: 06/17/2023 CLINICAL DATA:  fall EXAM: CT HEAD WITHOUT CONTRAST TECHNIQUE: Contiguous axial images were obtained from the base of the skull through the vertex without intravenous contrast. RADIATION DOSE REDUCTION: This exam was performed according to the departmental dose-optimization program which includes automated exposure control, adjustment of the mA and/or kV according to patient size and/or use of iterative reconstruction technique. COMPARISON:  Head CT 04/13/2023 FINDINGS: Brain: No intracranial hemorrhage, mass effect, or midline shift. Stable degree of atrophy and chronic small vessel ischemia. No hydrocephalus. The basilar cisterns are patent. No evidence of territorial infarct or acute ischemia. No extra-axial or intracranial fluid collection. Vascular: Atherosclerosis of skullbase vasculature without hyperdense vessel or abnormal calcification. Skull: No fracture or focal lesion. Sinuses/Orbits: No acute finding. Other: None. IMPRESSION: 1. No acute intracranial abnormality. No skull fracture. 2. Stable atrophy and chronic small vessel ischemia. Electronically Signed   By: Narda Rutherford M.D.   On: 06/17/2023 16:12   PROCEDURES: Critical Care performed: No Procedures MEDICATIONS ORDERED IN ED: Medications  sodium chloride 0.9 % bolus 1,000 mL (has no administration in time range)  ketorolac (TORADOL) 15 MG/ML injection 15 mg (has no administration in time range)  aspirin EC tablet 81 mg (has no administration in time range)  atorvastatin (LIPITOR) tablet 40 mg (has no administration in time range)  citalopram (CELEXA) tablet 20 mg (has no administration in time range)  QUEtiapine (SEROQUEL) tablet 50 mg (has no administration in time range)  ondansetron (ZOFRAN) tablet 4 mg (has  no administration in time range)  pantoprazole (PROTONIX) EC tablet 40 mg (has no administration in time range)  senna-docusate (Senokot-S) tablet 1 tablet (has no administration in time range)  enoxaparin (LOVENOX) injection 50 mg (has no administration in time range)  cefTRIAXone (ROCEPHIN) 2 g in sodium chloride 0.9 % 100 mL IVPB (has no administration in time range)  lactated ringers infusion (has no administration in time range)  insulin aspart (novoLOG) injection 0-9 Units (has no administration in time range)   IMPRESSION / MDM / ASSESSMENT AND PLAN / ED COURSE  I reviewed the triage vital signs and the nursing notes.                             The patient is on the cardiac monitor to evaluate for evidence of arrhythmia and/or significant heart rate changes. Patient's presentation is most consistent with acute presentation with potential threat to life or bodily function. Patient presents with positive blood cultures growing E. coli Patient is not septic appearing on exam and clinical presentation is not consistent with serious bacterial illness. Patient is tolerating PO without difficulty, is ambulatory and maintaining appropriate O2 saturations. ED Workup:  repeat basic infectious workup  including repeat blood cultures ED Interventions: antibiotic coverage(Zosyn) Disposition: Admit for IV Abx, monitoring, blood culture growth, and potential echocardiogram.   FINAL CLINICAL IMPRESSION(S) / ED DIAGNOSES   Final diagnoses:  Positive blood culture  Chronic bilateral low back pain with bilateral sciatica   Rx / DC Orders   ED Discharge Orders     None      Note:  This document was prepared using Dragon voice recognition software and may include unintentional dictation errors.   Merwyn Katos, MD 06/17/23 [phone]

## 2023-06-17 NOTE — ED Notes (Addendum)
CRITICAL VALUE STICKER  CRITICAL VALUE: Blood culture bottle 1/2 aerobic positive for Enterobacterales and Escherichia coli   RECEIVER (on-site recipient of call): Pamala Duffel, RN    DATE & TIME NOTIFIED: 06/17/2023 1478  MESSENGER (representative from lab):  MD NOTIFIED: Dr. York Cerise   TIME OF NOTIFICATION:0315  RESPONSE:  No new orders per MD.

## 2023-06-17 NOTE — ED Notes (Signed)
Pt clothes soiled with urine and feces. All clothes removed and placed in belongings bag. Pt given head to toe bed bath with wipes, placed in clean brief and hospital gown. Linens and chux pad replaced. Pt given fresh blanket and resting comfortably.

## 2023-06-17 NOTE — Telephone Encounter (Signed)
Informed by charge nurse Pamala Duffel that he was contacted by the lab and told that 1 of 2 aerobic bottles (blood cultures) tested positive for E. coli and enterobacterales.  [name] will attempt to reach out to the patient by phone and tell him to return to the emergency department for additional evaluation and treatment for possible bacteremia.

## 2023-06-18 ENCOUNTER — Encounter: Payer: Self-pay | Admitting: Internal Medicine

## 2023-06-18 DIAGNOSIS — R7881 Bacteremia: Secondary | ICD-10-CM | POA: Diagnosis not present

## 2023-06-18 DIAGNOSIS — R188 Other ascites: Secondary | ICD-10-CM

## 2023-06-18 DIAGNOSIS — K7460 Unspecified cirrhosis of liver: Secondary | ICD-10-CM | POA: Diagnosis not present

## 2023-06-18 DIAGNOSIS — N3000 Acute cystitis without hematuria: Secondary | ICD-10-CM | POA: Diagnosis not present

## 2023-06-18 DIAGNOSIS — B9620 Unspecified Escherichia coli [E. coli] as the cause of diseases classified elsewhere: Secondary | ICD-10-CM | POA: Diagnosis not present

## 2023-06-18 LAB — BASIC METABOLIC PANEL
Anion gap: 5 (ref 5–15)
BUN: 20 mg/dL (ref 8–23)
CO2: 24 mmol/L (ref 22–32)
Calcium: 8.9 mg/dL (ref 8.9–10.3)
Chloride: 111 mmol/L (ref 98–111)
Creatinine, Ser: 0.94 mg/dL (ref 0.61–1.24)
GFR, Estimated: >60 mL/min (ref >60)
Glucose, Bld: 82 mg/dL (ref 70–99)
Potassium: 3.9 mmol/L (ref 3.5–5.1)
Sodium: 140 mmol/L (ref 135–145)

## 2023-06-18 LAB — CBG MONITORING, ED
Glucose-Capillary: 56 mg/dL — ABNORMAL LOW (ref 70–99)
Glucose-Capillary: 98 mg/dL (ref 70–99)
Glucose-Capillary: 65 mg/dL — ABNORMAL LOW (ref 70–99)
Glucose-Capillary: 70 mg/dL (ref 70–99)

## 2023-06-18 LAB — CBC
HCT: 28.5 % — ABNORMAL LOW (ref 39.0–52.0)
Hemoglobin: 9.8 g/dL — ABNORMAL LOW (ref 13.0–17.0)
MCH: 31.2 pg (ref 26.0–34.0)
MCHC: 34.4 g/dL (ref 30.0–36.0)
MCV: 90.8 fL (ref 80.0–100.0)
Platelets: 73 10*3/uL — ABNORMAL LOW (ref 150–400)
RBC: 3.14 MIL/uL — ABNORMAL LOW (ref 4.22–5.81)
RDW: 18.1 % — ABNORMAL HIGH (ref 11.5–15.5)
WBC: 2.5 10*3/uL — ABNORMAL LOW (ref 4.0–10.5)
nRBC: 0 % (ref 0.0–0.2)

## 2023-06-18 LAB — IRON AND TIBC
Iron: 71 ug/dL (ref 45–182)
Saturation Ratios: 36 % (ref 17.9–39.5)
TIBC: 196 ug/dL — ABNORMAL LOW (ref 250–450)
UIBC: 125 ug/dL

## 2023-06-18 LAB — CORTISOL-AM, BLOOD: Cortisol - AM: 6.2 ug/dL — ABNORMAL LOW (ref 6.7–22.6)

## 2023-06-18 LAB — GLUCOSE, CAPILLARY
Glucose-Capillary: 143 mg/dL — ABNORMAL HIGH (ref 70–99)
Glucose-Capillary: 70 mg/dL (ref 70–99)

## 2023-06-18 LAB — MAGNESIUM: Magnesium: 1.8 mg/dL (ref 1.7–2.4)

## 2023-06-18 LAB — PHOSPHORUS: Phosphorus: 3.2 mg/dL (ref 2.5–4.6)

## 2023-06-18 LAB — VITAMIN B12: Vitamin B-12: 851 pg/mL (ref 180–914)

## 2023-06-18 MED ORDER — INFLUENZA VAC A&B SURF ANT ADJ 0.5 ML IM SUSY
0.5000 mL | PREFILLED_SYRINGE | INTRAMUSCULAR | Status: AC
  Administered 2023-06-21: 0.5 mL via INTRAMUSCULAR
  Filled 2023-06-18: qty 0.5

## 2023-06-18 MED ORDER — OXYCODONE-ACETAMINOPHEN 5-325 MG PO TABS
1.0000 | ORAL_TABLET | Freq: Four times a day (QID) | ORAL | Status: DC | PRN
  Administered 2023-06-18 – 2023-06-20 (×5): 1 via ORAL
  Filled 2023-06-18 (×5): qty 1

## 2023-06-18 MED ORDER — ACETAMINOPHEN 325 MG PO TABS: 650.0000 mg | ORAL_TABLET | Freq: Four times a day (QID) | ORAL | Status: DC | PRN

## 2023-06-18 NOTE — ED Notes (Signed)
Assumed care of pt at this time. Pt is asleep, RR and WOB WNL, NAD. No needs identified. Call light within reach, side rails up x2, bed locked and in the lowest position.

## 2023-06-18 NOTE — Progress Notes (Addendum)
Progress Note   Patient: [name]:[id number] DOB: [birth year] DOA: 06/17/2023     1 DOS: the patient was seen and examined on 06/18/2023   Brief hospital course: [name] is a 72 y.o. male with medical history significant of chronic diastolic congestive heart failure, COPD, type 2 diabetes, essential hypertension, obesity, sleep apnea not on CPAP, homelessness who lives on the street who came to the hospital with complaints of dysuria, was found to have E. coli bacteremia.    Active Problems:   Liver cirrhosis (HCC)   HTN (hypertension)   Diabetes mellitus without complication (HCC)   Obesity (BMI 30-39.9)   Major depressive disorder, 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 psychotic features (HCC)   E coli bacteremia   UTI (urinary tract infection)   Assessment and Plan: E. coli bacteremia. Urinary tract infection. Benign prostatic hypertrophy. Patient did not meet sepsis criteria, blood culture performed yesterday come back with E. coli in 1 bottle.  Susceptibilities still pending.  Patient was started on Rocephin 2 g IV every 24 hours.  Urine study negative.  Repeat blood culture before antibiotics pending. Patient received fluids, no longer has significant dysuria.   Dizziness with frequent falls. Lactic acidosis secondary to dehydration. Elevated troponin secondary to demand ischemia. Patient appeared to be dehydrated due to homelessness with limited fluid intake.  Patient received IV fluids, dizziness much improved today.   Liver cirrhosis. Pancytopenia. Patient had a history of liver cirrhosis, does not appear to have ascites.  Patient also has leukopenia and thrombocytopenia, he also developed some anemia.  Recheck a CBC, check iron B12 level. Patient did not have any black stool or rectal bleeding.   Chronic diastolic congestive heart failure No exacerbation.   COPD. No bronchospasm.   Essential hypertension, Blood pressure not significant elevated   Obesity  with BMI 35.58. Eat healthy and exercise.   Type 2 diabetes  Sliding scale insulin.   Homelessness  TOC consulted.       Subjective:  Patient feels better today, no nausea vomiting.  No longer have any dysuria.  Physical Exam: Vitals:   06/17/23 1258 06/18/23 0147 06/18/23 0630 06/18/23 1024  BP: (!) 101/55 (!) 132/109 125/66 (!) 121/58  Pulse: 61 (!) 52 (!) 55 (!) 52  Resp: 15 17 16 18   Temp: 99.1 F (37.3 C) 98.4 F (36.9 C) 98.5 F (36.9 C) 98 F (36.7 C)  TempSrc: Oral Oral Oral Oral  SpO2: 95% 100% 95% 96%   General exam: Appears calm and comfortable  Respiratory system: Clear to auscultation. Respiratory effort normal. Cardiovascular system: S1 & S2 heard, RRR. P2>A2. No JVD, murmurs, rubs, gallops or clicks. No pedal edema. Gastrointestinal system: Abdomen is nondistended, soft and nontender. No organomegaly or masses felt. Normal bowel sounds heard. Central nervous system: Alert and oriented. No focal neurological deficits. Extremities: Symmetric 5 x 5 power. Skin: No rashes, lesions or ulcers Psychiatry: Judgement and insight appear normal. Mood & affect appropriate.    Data Reviewed:  Lab results reviewed.  Family Communication: None available  Disposition: Status is: Inpatient Remains inpatient appropriate because: Supportive disease, IV treatment.     Time spent: 35 minutes  Author: Marrion Coy, MD 06/18/2023 12:36 PM  For on call review [email or web address].

## 2023-06-18 NOTE — TOC Initial Note (Signed)
Transition of Care [facility]) - Initial/Assessment Note    Patient Details  Name: [name] MRN: [id number] Date of Birth: 1951-01-25  [facility] of [facility]) CM/SW Contact:    Marquita Palms, LCSW Phone Number: 06/18/2023, 12:38 PM  Clinical Narrative:                  CSW met with patient bedside. He reported that he is homeless and he had a cane but no has it. He reports that he lives on the street because of a previous charge and he can not find an apartment that will "take him." He reports he has $1100 for SSI. He reports that he needs help with finding a place. CSW will reach out to DSS of [place]. Awaiting response from DSS.      Patient Goals and CMS Choice            Expected Discharge Plan and Services                                              Prior Living Arrangements/Services                       Activities of Daily Living      Permission Sought/Granted                  Emotional Assessment              Admission diagnosis:  E coli bacteremia [R78.81, B96.20] Patient Active Problem List   Diagnosis Date Noted   E coli bacteremia 06/17/2023   UTI (urinary tract infection) 06/17/2023   Left leg cellulitis 03/23/2023   Leukocytosis 03/23/2023   Serum total bilirubin elevated 03/23/2023   Left leg pain 03/23/2023   Suicidal ideation 01/14/2023   Acute pain 01/14/2023   Adrenal insufficiency (HCC) 12/26/2022   Orthostatic hypotension 12/24/2022   Junctional rhythm 12/24/2022   Rib pain on right side 12/24/2022   Hepatitis B infection 12/24/2022   Myocardial ischemia 12/24/2022   Acute appendicitis 11/05/2022   Pancytopenia (HCC) 11/05/2022   Sinus bradycardia 11/05/2022   Acute metabolic encephalopathy 09/22/2022   Liver lesion 09/22/2022   Liver cirrhosis (HCC) 09/22/2022   Hyperlipidemia, unspecified 09/22/2022   Diabetes mellitus without complication (HCC) 09/22/2022   Obesity (BMI 30-39.9)  09/22/2022   Acute hepatic encephalopathy (HCC) 09/22/2022   MDD (major depressive disorder), recurrent severe, 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   Major depressive disorder, 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 psychotic features (HCC) 09/15/2022   Protein-calorie malnutrition, severe (HCC) 08/31/2022   Hypotension 08/31/2022   Right sided weakness    Hx of transient ischemic attack (TIA) 04/28/2022   Obesity    Chronic diastolic CHF (congestive heart failure) (HCC)    Depression    Sepsis (HCC) 03/28/2022   Bacteremia due to Klebsiella pneumoniae 03/28/2022   Hypoglycemia 03/28/2022   Syncope 03/24/2022   Hypokalemia 03/24/2022   Fever 03/24/2022   COPD (chronic obstructive pulmonary disease) (HCC)    Elevated troponin    Abnormal LFTs    History of hepatitis C    AKI (acute kidney injury) (HCC)    Pure hypercholesterolemia    Obesity, Class III, BMI 40-49.9 (morbid obesity) (HCC) 04/18/2020   Thrombocytopenia (HCC) 04/17/2020   Leukopenia 04/17/2020   HTN (hypertension) 04/17/2018   Uncontrolled type 2 diabetes mellitus with  hyperglycemia, without long-term current use of insulin (HCC) 04/17/2018   Lymphedema 04/17/2018   Normocytic anemia 04/07/2018   Chest pain 03/20/2018   Unsheltered homelessness 12/16/2017   PCP:  [name], MD Pharmacy:   [facility] [address], [place] - [address] [place] [address] Phone: [phone] Fax: [phone]  [facility] [address]), [place] - [address] [place]) [place] [address] Phone: [phone] Fax: [phone]     Social Determinants of Health (SDOH) Social History: SDOH Screenings   Food Insecurity: Food Insecurity Present (03/25/2023)  Housing: High Risk (03/25/2023)  Transportation Needs: Unmet Transportation Needs (03/25/2023)  Utilities: At Risk (03/25/2023)  Alcohol Screen: Low Risk  (09/24/2022)  Depression (PHQ2-9): High Risk  (04/26/2022)  Financial Resource Strain: High Risk (04/27/2022)  Physical Activity: Sufficiently Active (04/16/2018)  Social Connections: Moderately Isolated (04/30/2018)  Stress: Stress Concern Present (04/16/2018)  Tobacco Use: Medium Risk (06/17/2023)   SDOH Interventions:     Readmission Risk Interventions    06/18/2023   12:36 PM 11/07/2022    1:45 PM  Readmission Risk Prevention Plan  Transportation Screening Complete Complete  Medication Review Oceanographer) Complete   PCP or Specialist appointment within 3-5 days of discharge Complete   HRI or Home Care Consult Complete   SW Recovery Care/Counseling Consult Not Complete Complete  Palliative Care Screening Not Complete Not Applicable  Skilled Nursing Facility Complete Not Applicable

## 2023-06-18 NOTE — ED Notes (Signed)
Pt given turkey sandwich and chips.

## 2023-06-18 NOTE — ED Notes (Signed)
Patient provided with 4oz apple juice and a coke due to BGL of 56.

## 2023-06-18 NOTE — Inpatient Diabetes Management (Signed)
Inpatient Diabetes Program Recommendations  AACE/ADA: New Consensus Statement on Inpatient Glycemic Control (2015)  Target Ranges:  Prepandial:   less than 140 mg/dL      Peak postprandial:   less than 180 mg/dL (1-2 hours)      Critically ill patients:  140 - 180 mg/dL   Lab Results  Component Value Date   GLUCAP 65 (L) 06/18/2023   HGBA1C 5.1 12/24/2022    Review of Glycemic Control  Latest Reference Range & Units 06/18/23 01:19 06/18/23 03:42 06/18/23 07:40  Glucose-Capillary 70 - 99 mg/dL 56 (L) 98 65 (L)   Diabetes history: DM 2 Outpatient Diabetes medications: Jardiance 10 mg Daily, metformin in the past Current orders for Inpatient glycemic control:  Novolog 0-9 units tid (not requiring insulin dose yet, mild hypoglycemia am of 10/22)  Inpatient Diabetes Program Recommendations:    Note: unsure of how long pt has been taking Jardiance (SGLT-2 inhibitor). This medication makes pt susceptible for genitourinary infections. Consider d/c ing Jardiance and have pt follow up with prescriber.  Thanks,  Christena Deem RN, MSN, BC-ADM Inpatient Diabetes Coordinator Team Pager [phone] (8a-5p)

## 2023-06-18 NOTE — ED Notes (Signed)
Pt is alert and orientated. NAD. Juice given.

## 2023-06-18 NOTE — Hospital Course (Addendum)
[name] is a 72 y.o. male with medical history significant of chronic diastolic congestive heart failure, COPD, type 2 diabetes, essential hypertension, obesity, sleep apnea not on CPAP, homelessness who lives on the street who came to the hospital with complaints of dysuria, was found to have E. coli bacteremia.   10/23.  Patient having some falls, patient having abdominal pain and weight loss.  Since urine analysis negative we will get a CAT scan of the abdomen pelvis to see if we have a source of the E. coli bacteremia. 10/24.  Patient having falls at home and weakness bilateral arms and legs.  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  Seen by Neurosurgery and will follow up as outpatient for potential surgery.

## 2023-06-19 ENCOUNTER — Inpatient Hospital Stay: Payer: 59

## 2023-06-19 DIAGNOSIS — E785 Hyperlipidemia, unspecified: Secondary | ICD-10-CM

## 2023-06-19 DIAGNOSIS — R7881 Bacteremia: Secondary | ICD-10-CM | POA: Diagnosis not present

## 2023-06-19 DIAGNOSIS — E119 Type 2 diabetes mellitus without complications: Secondary | ICD-10-CM | POA: Diagnosis not present

## 2023-06-19 DIAGNOSIS — K7460 Unspecified cirrhosis of liver: Secondary | ICD-10-CM | POA: Diagnosis not present

## 2023-06-19 DIAGNOSIS — I10 Essential (primary) hypertension: Secondary | ICD-10-CM

## 2023-06-19 DIAGNOSIS — F332 Major depressive disorder, 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 psychotic features: Secondary | ICD-10-CM

## 2023-06-19 DIAGNOSIS — I5032 Chronic diastolic (congestive) heart failure: Secondary | ICD-10-CM | POA: Diagnosis not present

## 2023-06-19 DIAGNOSIS — E669 Obesity, unspecified: Secondary | ICD-10-CM

## 2023-06-19 LAB — GLUCOSE, CAPILLARY
Glucose-Capillary: 70 mg/dL (ref 70–99)
Glucose-Capillary: 87 mg/dL (ref 70–99)
Glucose-Capillary: 91 mg/dL (ref 70–99)
Glucose-Capillary: 93 mg/dL (ref 70–99)
Glucose-Capillary: 99 mg/dL (ref 70–99)

## 2023-06-19 LAB — CBC
HCT: 30.3 % — ABNORMAL LOW (ref 39.0–52.0)
Hemoglobin: 10.2 g/dL — ABNORMAL LOW (ref 13.0–17.0)
MCH: 30.9 pg (ref 26.0–34.0)
MCHC: 33.7 g/dL (ref 30.0–36.0)
MCV: 91.8 fL (ref 80.0–100.0)
Platelets: 74 10*3/uL — ABNORMAL LOW (ref 150–400)
RBC: 3.3 MIL/uL — ABNORMAL LOW (ref 4.22–5.81)
RDW: 17.9 % — ABNORMAL HIGH (ref 11.5–15.5)
WBC: 2.2 10*3/uL — ABNORMAL LOW (ref 4.0–10.5)
nRBC: 0 % (ref 0.0–0.2)

## 2023-06-19 LAB — CULTURE, BLOOD (ROUTINE X 2): Special Requests: ADEQUATE

## 2023-06-19 MED ORDER — IOHEXOL 9 MG/ML PO SOLN
500.0000 mL | ORAL | Status: AC
  Administered 2023-06-19 (×2): 500 mL via ORAL

## 2023-06-19 MED ORDER — EMPAGLIFLOZIN 10 MG PO TABS
10.0000 mg | ORAL_TABLET | Freq: Every day | ORAL | Status: DC
  Administered 2023-06-20 – 2023-06-21 (×2): 10 mg via ORAL
  Filled 2023-06-19 (×2): qty 1

## 2023-06-19 MED ORDER — LOSARTAN POTASSIUM 50 MG PO TABS
50.0000 mg | ORAL_TABLET | Freq: Every day | ORAL | Status: DC
  Administered 2023-06-19 – 2023-06-21 (×3): 50 mg via ORAL
  Filled 2023-06-19 (×3): qty 1

## 2023-06-19 MED ORDER — SPIRONOLACTONE 25 MG PO TABS
25.0000 mg | ORAL_TABLET | Freq: Every day | ORAL | Status: DC
  Administered 2023-06-19 – 2023-06-21 (×3): 25 mg via ORAL
  Filled 2023-06-19 (×3): qty 1

## 2023-06-19 MED ORDER — RIFAXIMIN 550 MG PO TABS
550.0000 mg | ORAL_TABLET | Freq: Two times a day (BID) | ORAL | Status: DC
  Administered 2023-06-19 – 2023-06-21 (×4): 550 mg via ORAL
  Filled 2023-06-19 (×4): qty 1

## 2023-06-19 MED ORDER — IOHEXOL 300 MG/ML  SOLN
100.0000 mL | Freq: Once | INTRAMUSCULAR | Status: AC | PRN
  Administered 2023-06-19: 100 mL via INTRAVENOUS

## 2023-06-19 NOTE — Evaluation (Signed)
Physical Therapy Evaluation Patient Details Name: [name] MRN: [id number] DOB: Jan 26, 1951 Today's Date: 06/19/2023  History of Present Illness  72 y.o. male with PMHx of chronic diastolic congestive heart failure, COPD, type 2 diabetes, essential hypertension, cirrhosis, depression, obesity, sleep apnea not on CPAP, homelessness who lives on the street who came to the hospital with complaints of dysuria, was found to have E. coli bacteremia, feeling dizzy/lightheaded with falls.   Clinical Impression  Pt received in bed for BP reading following OT session and agreed to PT session. Pt reports pain located in his spine on a pain scale of 91/0, in his abdomen on a pain scale of 10/10, as well as pressure within his head posteriorly. Pt performed bed mobility supine>sit ModI with the use of bed railings to sit EOB. While at EOB pt reported that he felt unstable while seated as if his body wants to tip over to his right. Pt performed seated alt marches x5 prior to stopping due to fatigue. Pt performed STS at EOB CGA in order to use urinal. Bilat hands not supported/occupied, pt demonstrated fair standing balance however expressed a sense of feeling unstable. Pt ended session in bed, sit>supine ModI and performed ankle pumps x10 prior to ending session. Pt attempted to perform SLR, but reported increased back pain and heaviness within legs. Blood pressure readings were taken throughout session with the highest reading: 188/72 mmHg while seated EOB and the lowest BP reading at the end of session: 166/76 mmHg while in laying in bed. Amb was deferred today due to increased BP readings and instability. Pt will continue to benefit from skilled PT sessions in order to improve endurance, strength, activity tolerance, and functional mobility in order to maximize safety following d/c. Following session, message sent in secure chat to care team regarding medical concerns.       If plan is discharge home,  recommend the following: A lot of help with walking and/or transfers;A little help with bathing/dressing/bathroom;Assist for transportation;Help with stairs or ramp for entrance   Can travel by private vehicle        Equipment Recommendations Rolling walker (2 wheels)  Recommendations for Other Services       Functional Status Assessment Patient has had a recent decline in their functional status and demonstrates the ability to make significant improvements in function in a reasonable and predictable amount of time.     Precautions / Restrictions Precautions Precautions: Fall Restrictions Weight Bearing Restrictions: No      Mobility  Bed Mobility Overal bed mobility: Needs Assistance Bed Mobility: Supine to Sit, Sit to Supine     Supine to sit: Modified independent (Device/Increase time) Sit to supine: Modified independent (Device/Increase time)   General bed mobility comments: MOD I for bed mobility using bed rails and HOB elevated, no physical assist provided    Transfers Overall transfer level: Needs assistance   Transfers: Sit to/from Stand Sit to Stand: Contact guard assist           General transfer comment: Pt performed STS at EOB to use urinal CGA.    Ambulation/Gait               General Gait Details: Amb deferred for today's PT session due to increased BP readings. Prior to the start of PT session, pt amb from bed to bathroom with OT. Pt demonstrated elevated BP following amb to bathroom and expressed that he was stressed while amb due to fear of falling.  Stairs  Wheelchair Mobility     Tilt Bed    Modified Rankin (Stroke Patients Only)       Balance Overall balance assessment: Needs assistance Sitting-balance support: Feet supported, Bilateral upper extremity supported Sitting balance-Leahy Scale: Fair Sitting balance - Comments: Pt expressed that he felt off balance while seated at EOB. Pt reported that he felt that  his body wanted to tip over to his right. Postural control: Right lateral lean Standing balance support: No upper extremity supported Standing balance-Leahy Scale: Fair Standing balance comment: Pt performed STS CGA at EOB to use bathroom. Bilat hands were occupied for urinal and gown management. While standing pt expressed that he feels off balance.                             Pertinent Vitals/Pain Pain Assessment Pain Assessment: Faces Faces Pain Scale: Hurts little more Pain Location: back, back of head and abdomen Pain Descriptors / Indicators: Sore, Pressure Pain Intervention(s): Monitored during session    Home Living Family/patient expects to be discharged to:: Shelter/Homeless                   Additional Comments: lives in a tent behind [place] since his family was kicked out of a house    Prior Function Prior Level of Function : Independent/Modified Independent             Mobility Comments: walks with a rollator in the community. takes the bus for transportation. Only tolerates 20-27ft bouts AMB at baseline with resting breaks due to COPD. uses motorized cart in grocery stores d/t long distance mobility ADLs Comments: Mod I, uses a bed side commode in the tent for toileting     Extremity/Trunk Assessment   Upper Extremity Assessment Upper Extremity Assessment: Generalized weakness (Bilat hand tremors and decreased grip strength. Pt reports dropping items due to this.)    Lower Extremity Assessment Lower Extremity Assessment: Generalized weakness (Pt reports sometimes having tingling/numbness in bilat LE)       Communication   Communication Communication: No apparent difficulties Cueing Techniques: Verbal cues  Cognition Arousal: Alert Behavior During Therapy: WFL for tasks assessed/performed Overall Cognitive Status: Within Functional Limits for tasks assessed                                 General Comments: AOx4. Pt  pleasant and willing to participate in PT session.        General Comments General comments (skin integrity, edema, etc.): Pt reported feeling pressure in his head posteriorly. With seated activity EOB, pt stated that pressure felt like it was spreading within his head.    Exercises Other Exercises Other Exercises: Seated Alt Marches x5, Ankle pumps in bed x10. Pt reports back pain and heaviness within legs with SLR x1 rep per leg while in bed.   Assessment/Plan    PT Assessment Patient needs continued PT services  PT Problem List Decreased strength;Decreased activity tolerance;Decreased balance;Decreased mobility;Pain       PT Treatment Interventions Therapeutic activities    PT Goals (Current goals can be found in the Care Plan section)  Acute Rehab PT Goals Patient Stated Goal: To decrease pain. PT Goal Formulation: With patient Time For Goal Achievement: 07/03/23 Potential to Achieve Goals: Good    Frequency Min 1X/week     Co-evaluation  AM-PAC PT "6 Clicks" Mobility  Outcome Measure Help needed turning from your back to your side while in a flat bed without using bedrails?: A Little Help needed moving from lying on your back to sitting on the side of a flat bed without using bedrails?: A Little Help needed moving to and from a bed to a chair (including a wheelchair)?: A Little Help needed standing up from a chair using your arms (e.g., wheelchair or bedside chair)?: A Little Help needed to walk in hospital room?: A Lot Help needed climbing 3-5 steps with a railing? : A Lot 6 Click Score: 16    End of Session   Activity Tolerance: Patient limited by pain Patient left: in bed;with call bell/phone within reach;with bed alarm set Nurse Communication: Mobility status PT Visit Diagnosis: Unsteadiness on feet (R26.81);Other abnormalities of gait and mobility (R26.89);Repeated falls (R29.6);Muscle weakness (generalized) (M62.81);History of falling  (Z91.81);Difficulty in walking, not elsewhere classified (R26.2);Other symptoms and signs involving the nervous system (R29.898);Pain Pain - part of body: Hand (Back and head.)    Time: 7829-5621 PT Time Calculation (min) (ACUTE ONLY): 35 min   Charges:   PT Evaluation $PT Eval Moderate Complexity: 1 Mod PT Treatments $Therapeutic Exercise: 8-22 mins PT General Charges $$ ACUTE PT VISIT: 1 Visit         Dava Rensch [place] Howard SPT, LAT, ATC   Dariona Postma [place]Howard 06/19/2023, 1:30 PM

## 2023-06-19 NOTE — Assessment & Plan Note (Addendum)
Currently on Rocephin.  Case discussed with ID pharmacist and can prescribe Augmentin upon going home and will give a total course of 10 days (11 more doses)

## 2023-06-19 NOTE — Assessment & Plan Note (Signed)
On Lipitor 

## 2023-06-19 NOTE — Assessment & Plan Note (Addendum)
Pancytopenia, hepatic encephalopathy.  Ammonia level 81.  Continue Xifaxan.

## 2023-06-19 NOTE — Discharge Instructions (Addendum)
Rent/Utility/Housing  Agency Name: [facility] Agency Address: [address] [place], [place] [address] Phone: [phone] Email: [email or web address] Website: [email or web address] Service(s) Offered: Housing services, self-sufficiency, congregate meal program, weatherization program, Field seismologist program, emergency food assistance,  housing counseling, home ownership program, wheels -towork program.  Agency Name: [facility] Address: [address], [place], [place] [address] Phone: [phone] (8a-4p) [phone] (8p- 10p) Email: [email or web address] Website: [email or web address] Service(s) Offered: A program for homeless and/or needy men that includes one-on-one counseling, life skills training and job rehabilitation.  Agency Name: [facility] [place] Address: [address] [place], [place] [address] Phone: [phone] Website: www.alliedchurches.org Service(s) Offered: Assistance to needy in emergency with utility bills, heating fuel, and prescriptions. Shelter for homeless 7pm-7am. December 20, 2016 15  Agency Name: [facility] [place] (Developmentally Disabled) Address: [address], [place], [place] [address] Phone: [phone] Contact Person: Cathleen Corti Email: [email or web address] Website: [email or web address] Service(s) Offered: Helps individuals with developmental disabilities move from housing that is more restrictive to homes where they  can achieve greater independence and have more  opportunities.  Agency Name: [facility] Address: [address], [place], [place] [address] Phone: [phone] Email: [email or web address] Website: www.[email or web address].org Service(s) Offered: Provides affordable housing for low-income families, elderly, and disabled individuals. Offer a wide range of  programs and services, from financial planning to  afterschool and summer programs.  Agency Name: [facility] of Social Services Address: [address] [place], [place] [address] Phone: [phone] Service(s) Offered: Child support services; child welfare services; food stamps; Medicaid; work first family assistance; and aid with fuel,  rent, food and medicine.  Agency Name: [facility] [place] [employer]. Address: [facility] [address] [place], [place]  [address] Phone: [phone] Website: www.familyabuseservices.org Service(s) Offered: 24 hour Crisis Line: [phone]; 24 hour Emergency Shelter; Transitional Housing; Support Groups; Scientist, physiological; [employer]; Hispanic Outreach: [phone];  Visitation Center: [phone].  Agency Name: [facility] [place]. Address: [address], [place], [place] [address] Phone: [phone] Service(s) Offered: CAP Services; Home and [employer]; Individual or Group Supports; Respite Care Non-Institutional Nursing;  Residential Supports; Respite Care and Personal Care Services; Transportation; Family and Friends Night; Recreational Activities; Three Nutritious Meals/Snacks; Consultation with Registered Dietician; Twenty-four hour Registered Nurse Access; Daily and [employer]; Camp Green Leaves; [place] for the [employer] (During Summer Months) Bingo Night (Every  Wednesday Night); Special Populations Dance Night  (Every Tuesday Night); Professional Hair Care Services.  Agency Name: [facility] Address: [address], [place], [place] [address] Phone: [phone] Contact Person: Jabier Mutton Website: [email or web address].Physicist, medical) Offered: Residential treatment facility for women; food and  clothing, educational & employment development and  transportation to work; Counsellor of financial skills;  parenting and family reunification; emotional and spiritual  support;  transitional housing for program graduates.  Agency Name: [facility] Address: [address], [place], [place] [address] Phone: [phone] Email: [email or web address] Website: [email or web address].es Service(s) Offered: Public housing units for elderly, disabled, and low income people; housing choice vouchers for income eligible  applicants; shelter plus care vouchers; and Psychologist, clinical.  Agency Name: [facility] [employer] Address: [address], [place], [place] [address] Phone: [phone] Email: [email or web address] Website: www.habitatalamance.org Service(s) Offered: Build houses for families in need of decent housing. Each adult in the family must invest 200 hours of labor on  someone else's house, work with volunteers to build their own house, attend classes  on budgeting, home maintenance, yard care, and attend homeowner association meetings.  Agency Name: [facility] Address: [address], [place], [place] [address] Phone: [phone] Website: www.rsli.org Service(s) Offered: Intermediate care facilities for intellectually delayed, Supervised Living in group homes for adults with developmental disabilities, Supervised Living for people who have dual diagnoses (MRMI), Independent Living, Supported Living, respite and a variety of CAP services, pre-vocational services, day supports, and [employer].  Agency Name: [place] [facility] Phone: [phone] Website: www.NCForeclosurePrevention.gov Service(s) Offered: Zero-interest, deferred loans to homeowners struggling to pay their mortgage. Call for more information.  Transportation Resources  Agency Name: [facility] Agency Address: [address] [place], [place] [address] Phone: [phone] Email: [email or web address] Website: www.[email or web address].org Service(s) Offered: Housing services, self-sufficiency, congregate  meal program, weatherization program, Field seismologist program, emergency food assistance,  housing counseling, home ownership program, wheels-towork program.  Agency Name: [facility] [phone]) Address: [address] [place], [place] [address] Phone: [phone] Website: www.acta-[email or web address].[email or web address] Service(s) Offered: Transportation for [employer], subscription and demand response; Dial-a-Ride for citizens 72 years of age or older.  Agency Name: Department of Social Services Address: [address] [place], [place] [address] Phone: [phone] Service(s) Offered: Child support services; child welfare services; food stamps; Medicaid; work first family assistance; and aid with fuel,  rent, food and medicine, transportation assistance.  Agency Name: Disabled [employer] (DAV) Transportation  Network Phone: [phone] Service(s) Offered: Transports veterans to the [facility] medical center. Call  forty-eight hours in advance and leave the name, telephone  number, date, and time of appointment. Veteran will be  contacted by the driver the day before the appointment to  arrange a pick up point   Transportation Resources  Agency Name: [facility] Agency Address: [address] [place], [place] [address] Phone: [phone] Email: [email or web address] Website: www.[email or web address].org Service(s) Offered: Housing services, self-sufficiency, congregate meal program, weatherization program, Field seismologist program, emergency food assistance,  housing counseling, home ownership program, wheels-towork program.  Agency Name: [facility] [employer] [phone]) Address: [address] [place], [place] [address] Phone: [phone] Website: www.acta-.com Service(s) Offered: Transportation for [employer], subscription and demand response; Dial-a-Ride for citizens 60 years  of age or older.  Agency Name: Department of Social Services Address: [address] [place], [place] [address] Phone: [phone] Service(s) Offered: Child support services; child welfare services; food stamps; Medicaid; work first family assistance; and aid with fuel,  rent, food and medicine, transportation assistance.  Agency Name: Disabled [employer] (DAV) Transportation  Network Phone: [phone] Service(s) Offered: Transports veterans to the [facility] medical center. Call  forty-eight hours in advance and leave the name, telephone  number, date, and time of appointment. Veteran will be  contacted by the driver the day before the appointment to  arrange a pick up point    [employer] ACTA currently provides door to door services. ACTA connects with PART daily for services to [facility]. ACTA also performs contract services to [employer] operates 27 vehicles, all but 3 mini-vans are equipped with lifts for special needs as well as the general public. ACTA drivers are each CDL certified and trained in First Aid and CPR. ACTA was established in 2002 by [employer]. An independent Industrial/product designer. ACTA operates via Cytogeneticist with required Research scientist (physical sciences) from [place]. ACTA provides over 80,000 passenger trips each year, including Friendship  Adult Day Services and [employer] sites.  Call at least by 11 AM one business day prior to needing transportation  [employer].                      [place], [place] [address]     Office Hours: Monday-Friday  8 AM - 5 PM  Agency Name: [facility] Agency Address: [address] [place], [place] [address] Phone: [phone] Website: www.alamanceservices.org Service(s) Offered: Housing services, self-sufficiency, congregate meal program, and individual development  account program.  Agency Name: [facility] [place] Address: [address] [place], [place] [address] Phone: [phone] Email: [email or web address] Website: www.alliedchurches.org Service(s) Offered: Housing the homeless, feeding the hungry, Company secretary, job and education related services.  Agency Name: [facility] Address: [address] [place], [place] [address] Phone: [phone] Email: [email or web address] Service(s) Offered: Counseling, problem pregnancy, advocacy for Hispanics, limited emergency financial assistance.  Agency Name: Department of Social Services Address: [address] [place], [place] [address] Phone: [phone] Website: [email or web address] Service(s) Offered: Child support services; child welfare services; SNAP; Medicaid; work first family assistance; and aid with fuel,  rent, food and medicine.  Agency Name: [facility] Address: [address], [place], [place] [address] Phone: [phone] or [phone] Email: [email or web address] Service(s) Offered: Family services and transient assistance; emergency food, fuel, clothing, limited furniture, utilities; budget counseling, general counseling; give a kid a coat; thrift store; Christmas food and toys. Utility assistance, food pantry, rental  assistance, life sustaining medicine   Food Resources  Agency Name: [facility] Agency Address: [address] [place], [place] [address] Phone: [phone] Website: www.alamanceservices.org Service(s) Offered: Housing services, self-sufficiency, congregate meal program, weatherization program, Event organiser program, emergency food assistance,  housing counseling, home ownership program, wheels - to work program.  Dole Food free for 60 and older at various locations from [employer], Monday-Friday:  [employer], [address] [place],  [phone] -[facility], [address] [place] [phone]  -[facility], [address], [place] [phone]  -[address], [address] [place], [phone]  Agency Name: [facility] on [facility] Address: [phone] [address], [place], [place] [address] Phone: [phone] Website: www.alamancemow.org Service(s) Offered: Home delivered hot, frozen, and emergency  meals. Grocery assistance program which matches  volunteers one-on-one with seniors unable to grocery shop  for themselves. Must be 60 years and older; less than 20  hours of in-home aide service, limited or no driving ability;  live alone or with someone with a disability; live in  [place].  Agency Name: [facility]) Address: [address], [place], [place] [address] Phone: [phone] Service(s) Offered: Food is served to shut-ins, homeless, elderly, and low income people in the community every Saturday (11:30 am-12:30 pm) and Sunday (12:30 pm-1:30pm). Volunteers also offer help and encouragement in seeking employment,  and spiritual guidance.  Agency Name: Department of Social Services Address: [address] [place], [place] [address] Phone: [phone] Service(s) Offered: Child support services; child welfare services; food stamps; Medicaid; work first family assistance; and aid with fuel,  rent, food and medicine.  Agency Name: [facility] Address: [address], [place], [place] Phone: [phone] Website: www.dreamalign.com Services Offered: Monday 10:00am-12:00, 8:00pm-9:00pm, and Friday 10:00am-12:00.  Agency Name: [facility] [place] Address: [address] [place], [place] [address] Phone: [phone] Website: www.alliedchurches.org Service(s) Offered: Serves weekday meals, open from 11:30  am- 1:00 pm., and 6:30-7:30pm, Monday-Wednesday-Friday distributes food  3:30-6pm, Monday-Wednesday-Friday.  Agency Name: [facility] Address: [address] [place], [place] Phone: [phone] Website: www.[email or web address].org Services Offered: Distributes food the 4th Saturday of the month, starting at 8:00 am  Agency Name: [facility] Address: [phone] [place], [place] [address] Phone: [phone] Website: [email or web address] Service(s) Offered: Bread of life, weekly food pantry. Open Wednesdays from 10:00am-noon.  Agency Name: [facility] [employer] Address: [address] [place], [place], [place] Phone: [phone] Services Offered: Distributes food 9am-1pm, Monday-Thursday. Call for details.  Agency Name: [facility] Address: [address] [place], [place] [address] Phone: [phone] Website: firstbaptistburlington.com Service(s) Offered: Games developer. Call for assistance.  Agency Name: [facility] Address: [address], [place], [place] [address] Phone: [phone] Service Offered: Emergency Food Pantry. Call for appointment.  Agency Name: [facility] Address: [address] [place], [place] [address] Phone: [phone] Website: [email or web address] Services Offered: Games developer. Call for details  Agency Name: [facility] Address: [address] [place], [place] Phone: [phone] Website: [email or web address] Service(s) Offered: Emergency Food Pantry. Call for details.  Agency Name: [facility] Address: [address] [place], [place] [address] Phone: [phone] or [phone] Website: [email or web address] Service(s) Offered: Distribute food 9am-11:30 am, Tuesday-Friday, and 1-3:30pm, Monday-Friday. Food pantry Monday-Friday 1pm-3pm, fresh items, Mon.-Wed.-Fri.  Agency Name: [facility] Address: [address] [place], [place] [address] Phone:  [phone] Website: www.safealamance.org Services Offered: Distribute food Tues and Sats from 9:00am-noon. Closed 1st Saturday of each month. Call for details  Agency Name: [facility] Address: [address] [facility] [address] [place] [address] Phone: [phone]  Services Offered: Delivers meals every Thursday

## 2023-06-19 NOTE — Assessment & Plan Note (Signed)
Will monitor how he does today may need to MRI cervical spine and brain again.  Did have spinal stenosis seen on last cervical spine MRI.

## 2023-06-19 NOTE — Progress Notes (Signed)
Progress Note   Patient: [name] LKG:[id number] DOB: 1950-10-08 DOA: 06/17/2023     2 DOS: the patient was seen and examined on 06/19/2023   Brief hospital course: [name] is a 72 y.o. male with medical history significant of chronic diastolic congestive heart failure, COPD, type 2 diabetes, essential hypertension, obesity, sleep apnea not on CPAP, homelessness who lives on the street who came to the hospital with complaints of dysuria, was found to have E. coli bacteremia.   10/23.  Patient having some falls, patient having abdominal pain and weight loss.  Since urine analysis negative we will get a CAT scan of the abdomen pelvis to see if we have a source of the E. coli bacteremia.  Assessment and Plan: * E coli bacteremia Currently on Rocephin.  Since urine analysis looks negative I will get a CAT scan of the abdomen to look for source of E. coli bacteremia.  Likely will end up going home with Levaquin.  Liver cirrhosis (HCC) Pancytopenia.  Check ammonia level tomorrow.  HTN (hypertension) Restart losartan and spironolactone.  Chronic diastolic CHF (congestive heart failure) (HCC) Restart Jardiance tomorrow.  Restart losartan and spironolactone today.  Diabetes mellitus without complication (HCC) Sugars are good.  Hold metformin.  Sliding scale insulin.  Hemoglobin A1c 5.1.  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  Did have spinal stenosis seen on last cervical spine MRI.        Subjective: Patient having some abdominal pain.  Called back from emergency room for positive blood cultures with E. coli.  Urine analysis negative.  Patient also having falls as outpatient.  Physical Exam: Vitals:   06/18/23 1625 06/18/23 2003 06/19/23 0413 06/19/23 0842   BP: (!) 142/74 (!) 150/62 (!) 172/69 (!) 187/87  Pulse: (!) 53 (!) 54 (!) 50 (!) 52  Resp: 16 17 17 18   Temp: 98.6 F (37 C) 98.8 F (37.1 C) 98.1 F (36.7 C) 98.1 F (36.7 C)  TempSrc: Oral Oral Oral   SpO2: 99% 98% 100% 100%  Height:       Physical Exam HENT:     Head: Normocephalic.     Mouth/Throat:     Pharynx: No oropharyngeal exudate.  Eyes:     General: Lids are normal.     Conjunctiva/sclera: Conjunctivae normal.  Cardiovascular:     Rate and Rhythm: Normal rate and regular rhythm.     Heart sounds: Normal heart sounds, S1 normal and S2 normal.  Pulmonary:     Breath sounds: No decreased breath sounds, wheezing, rhonchi or rales.  Abdominal:     Palpations: Abdomen is soft.     Tenderness: There is abdominal tenderness in the left lower quadrant.  Musculoskeletal:     Right lower leg: No swelling.     Left lower leg: No swelling.  Skin:    General: Skin is warm.     Findings: No rash.  Neurological:     Mental Status: He is alert and oriented to person, place, and time.     Comments: Able to straight leg raise.     Data Reviewed: White blood cell count 2.2, hemoglobin 10.2, platelet count 74 E. coli in blood culture  Disposition: Status is: Inpatient Remains inpatient appropriate because: Trying to find a source of E. coli in  blood culture.  Planned Discharge Destination: Home    Time spent: 28 minutes  Author: Alford Highland, MD 06/19/2023 2:08 PM  For on call review [email or web address].

## 2023-06-19 NOTE — TOC Progression Note (Signed)
Transition of Care [facility]) - Progression Note    Patient Details  Name: [name] MRN: [id number] Date of Birth: 1951/03/19  [facility]) CM/SW Contact  Chapman Fitch, RN Phone Number: 06/19/2023, 2:21 PM  Clinical Narrative:      Doristine Mango assessed patient at bedside and can offer patient a bed at [facility] family care home at [address] [facility] in [place].  Patient accepts bed Pharmacy updated to [facility] in [place] will transport at discharge Will need updated Fl2 with med list at discharge  MD updated on plan       Expected Discharge Plan and Services                                               Social Determinants of Health (SDOH) Interventions SDOH Screenings   Food Insecurity: Food Insecurity Present (06/18/2023)  Housing: High Risk (06/18/2023)  Transportation Needs: Unmet Transportation Needs (06/18/2023)  Utilities: At Risk (06/18/2023)  Alcohol Screen: Low Risk  (09/24/2022)  Depression (PHQ2-9): High Risk (04/26/2022)  Financial Resource Strain: High Risk (04/27/2022)  Physical Activity: Sufficiently Active (04/16/2018)  Social Connections: Moderately Isolated (04/30/2018)  Stress: Stress Concern Present (04/16/2018)  Tobacco Use: Medium Risk (06/18/2023)    Readmission Risk Interventions    06/18/2023   12:36 PM 11/07/2022    1:45 PM  Readmission Risk Prevention Plan  Transportation Screening Complete Complete  Medication Review Oceanographer) Complete   PCP or Specialist appointment within 3-5 days of discharge Complete   HRI or Home Care Consult Complete   SW Recovery Care/Counseling Consult Not Complete Complete  Palliative Care Screening Not Complete Not Applicable  Skilled Nursing Facility Complete Not Applicable

## 2023-06-19 NOTE — Assessment & Plan Note (Signed)
BMI 35.58

## 2023-06-19 NOTE — Assessment & Plan Note (Addendum)
Sugars are on the lower side.  Hold metformin.  Sliding scale insulin.  Hemoglobin A1c 5.1. [name] is more for heart failure than diabetes.

## 2023-06-19 NOTE — TOC Progression Note (Addendum)
Transition of Care [facility]) - Progression Note    Patient Details  Name: [name] MRN: [id number] Date of Birth: 04/26/1951  Transition of Care [facility]) CM/SW Contact  Chapman Fitch, RN Phone Number: 06/19/2023, 10:28 AM  Clinical Narrative:    Per Katharine Look with TOC patient would like to pursue group home placement.  Doristine Mango that owns several group homes/family care homes in the area currently as openings.  Fl2 sent to MD to sign in order for [facility] to Review.  Doristine Mango to come meet with patient   Food. Housing, transportation, and utility resources added to AVS      Expected Discharge Plan and Services                                               Social Determinants of Health (SDOH) Interventions SDOH Screenings   Food Insecurity: Food Insecurity Present (06/18/2023)  Housing: High Risk (06/18/2023)  Transportation Needs: Unmet Transportation Needs (06/18/2023)  Utilities: At Risk (06/18/2023)  Alcohol Screen: Low Risk  (09/24/2022)  Depression (PHQ2-9): High Risk (04/26/2022)  Financial Resource Strain: High Risk (04/27/2022)  Physical Activity: Sufficiently Active (04/16/2018)  Social Connections: Moderately Isolated (04/30/2018)  Stress: Stress Concern Present (04/16/2018)  Tobacco Use: Medium Risk (06/18/2023)    Readmission Risk Interventions    06/18/2023   12:36 PM 11/07/2022    1:45 PM  Readmission Risk Prevention Plan  Transportation Screening Complete Complete  Medication Review Oceanographer) Complete   PCP or Specialist appointment within 3-5 days of discharge Complete   HRI or Home Care Consult Complete   SW Recovery Care/Counseling Consult Not Complete Complete  Palliative Care Screening Not Complete Not Applicable  Skilled Nursing Facility Complete Not Applicable

## 2023-06-19 NOTE — [place]2 (Addendum)
[place] MEDICA[place]2 LEVEL OF CARE FORM     IDENTIFICATION  Patient Name: [name] Birthdate: [birth year] Sex: male Admission Date (Current Location): 06/17/2023  [facility] [place] Number:  Chiropodist and Address:         Provider Number: [id number]  Attending Physician Name and Address:  Alford Highland, MD  Relative Name and Phone Number:       Current Level of Care: Hospital Recommended Level of Care: Family Care Home Prior Approval Number:    Date Approved/Denied:   PASRR Number:    Discharge Plan: Other (Comment) (Family Care home/group)    Current Diagnoses: Patient Active Problem List   Diagnosis Date Noted   E coli bacteremia 06/17/2023   UTI (urinary tract infection) 06/17/2023   Left leg cellulitis 03/23/2023   Leukocytosis 03/23/2023   Serum total bilirubin elevated 03/23/2023   Left leg pain 03/23/2023   Suicidal ideation 01/14/2023   Acute pain 01/14/2023   Adrenal insufficiency (HCC) 12/26/2022   Orthostatic hypotension 12/24/2022   Junctional rhythm 12/24/2022   Rib pain on right side 12/24/2022   Hepatitis B infection 12/24/2022   Myocardial ischemia 12/24/2022   Acute appendicitis 11/05/2022   Pancytopenia (HCC) 11/05/2022   Sinus bradycardia 11/05/2022   Acute metabolic encephalopathy 09/22/2022   Liver lesion 09/22/2022   Liver cirrhosis (HCC) 09/22/2022   Hyperlipidemia, unspecified 09/22/2022   Diabetes mellitus without complication (HCC) 09/22/2022   Obesity (BMI 30-39.9) 09/22/2022   Acute hepatic encephalopathy (HCC) 09/22/2022   MDD (major depressive disorder), recurrent severe, 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   Major depressive disorder, 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 psychotic features (HCC) 09/15/2022   Protein-calorie malnutrition, severe (HCC) 08/31/2022   Hypotension 08/31/2022   Right sided weakness    Hx of transient ischemic attack (TIA) 04/28/2022   Obesity    Chronic diastolic CHF (congestive  heart failure) (HCC)    Depression    Sepsis (HCC) 03/28/2022   Bacteremia due to Klebsiella pneumoniae 03/28/2022   Hypoglycemia 03/28/2022   Syncope 03/24/2022   Hypokalemia 03/24/2022   Fever 03/24/2022   COPD (chronic obstructive pulmonary disease) (HCC)    Elevated troponin    Abnormal LFTs    History of hepatitis C    AKI (acute kidney injury) (HCC)    Pure hypercholesterolemia    Obesity, Class III, BMI 40-49.9 (morbid obesity) (HCC) 04/18/2020   Thrombocytopenia (HCC) 04/17/2020   Leukopenia 04/17/2020   HTN (hypertension) 04/17/2018   Uncontrolled type 2 diabetes mellitus with hyperglycemia, without long-term current use of insulin (HCC) 04/17/2018   Lymphedema 04/17/2018   Normocytic anemia 04/07/2018   Chest pain 03/20/2018   Unsheltered homelessness 12/16/2017    Orientation RESPIRATION BLADDER Height & Weight     Self, Time, Situation, Place  Normal Continent Weight:   Height:  5\' 6"  (167.6 cm)  BEHAVIORAL SYMPTOMS/MOOD NEUROLOGICAL BOWEL NUTRITION STATUS      Continent Diet (Heart healthy)  AMBULATORY STATUS COMMUNICATION OF NEEDS Skin   Limited Assist Verbally Normal                       Personal Care Assistance Level of Assistance              Functional Limitations Info             SPECIAL CARE FACTORS FREQUENCY                       Contractures Contractures  Info: Not present    Additional Factors Info  Code Status, Allergies Code Status Info: Full Allergies Info: Bee Venom, Codeine, Lactulose, Novocain (Procaine), Sulfa Antibiotics           Medication List       STOP taking these medications     ascorbic acid 500 MG tablet Commonly known as: VITAMIN C    aspirin EC 81 MG tablet    doxepin 50 MG capsule Commonly known as: SINEQUAN    EPINEPHrine 0.3 mg/0.3 mL Soaj injection Commonly known as: EPI-PEN    metFORMIN 500 MG tablet Commonly known as: GLUCOPHAGE    nitroGLYCERIN 0.4 MG SL tablet Commonly  known as: NITROSTAT    senna-docusate 8.6-50 MG tablet Commonly known as: Senokot-S    tizanidine 2 MG capsule Commonly known as: ZANAFLEX           TAKE these medications     amoxicillin-clavulanate 875-125 MG tablet Commonly known as: AUGMENTIN Take 1 tablet by mouth every 12 (twelve) hours for 11 doses.    atorvastatin 40 MG tablet Commonly known as: Lipitor Take 1 tablet (40 mg total) by mouth daily.    citalopram 20 MG tablet Commonly known as: CeleXA Take 1 tablet (20 mg total) by mouth daily.    empagliflozin 10 MG Tabs tablet Commonly known as: Jardiance Take 1 tablet (10 mg total) by mouth daily before breakfast.    furosemide 20 MG tablet Commonly known as: LASIX Take 1 tablet (20 mg total) by mouth daily. What changed:  medication strength See the new instructions.    gabapentin 300 MG capsule Commonly known as: NEURONTIN Take 300 mg by mouth 2 (two) times daily.    losartan 50 MG tablet Commonly known as: COZAAR Take 1 tablet (50 mg total) by mouth daily.    multivitamin with minerals Tabs tablet Take 1 tablet by mouth daily.    ondansetron 4 MG tablet Commonly known as: ZOFRAN Take 1 tablet (4 mg total) by mouth every 6 (six) hours as needed for nausea.    oxyCODONE-acetaminophen 5-325 MG tablet Commonly known as: PERCOCET/ROXICET Take 1 tablet by mouth every 6 (six) hours as needed for severe pain (pain score 7-10).    pantoprazole 40 MG tablet Commonly known as: Protonix Take 1 tablet (40 mg total) by mouth daily.    QUEtiapine 50 MG tablet Commonly known as: SEROQUEL Take 1 tablet (50 mg total) by mouth at bedtime.    rifaximin 550 MG Tabs tablet Commonly known as: XIFAXAN Take 1 tablet (550 mg total) by mouth 2 (two) times daily.    spironolactone 25 MG tablet Commonly known as: ALDACTONE Take 0.5 tablets (12.5 mg total) by mouth daily.           Relevant Lab Results:   Additional Information S# [id number]  Chapman Fitch, RN

## 2023-06-19 NOTE — Assessment & Plan Note (Addendum)
Continue losartan. Lasix, spironolactone and jardiance.

## 2023-06-19 NOTE — Assessment & Plan Note (Addendum)
Continue losartan and spironolactone and lasix

## 2023-06-19 NOTE — Plan of Care (Signed)
  Problem: Fluid Volume: Goal: Hemodynamic stability will improve Outcome: Progressing   Problem: Clinical Measurements: Goal: Diagnostic test results will improve Outcome: Progressing Goal: Signs and symptoms of infection will decrease Outcome: Progressing   Problem: Respiratory: Goal: Ability to maintain adequate ventilation will improve Outcome: Progressing   Problem: Education: Goal: Knowledge of disease or condition will improve Outcome: Progressing Goal: Knowledge of secondary prevention will improve (MUST DOCUMENT ALL) Outcome: Progressing Goal: Knowledge of patient specific risk factors will improve Loraine Leriche N/A or DELETE if not current risk factor) Outcome: Progressing   Problem: Ischemic Stroke/TIA Tissue Perfusion: Goal: Complications of ischemic stroke/TIA will be minimized Outcome: Progressing   Problem: Health Behavior/Discharge Planning: Goal: Ability to manage health-related needs will improve Outcome: Progressing Goal: Goals will be collaboratively established with patient/family Outcome: Progressing   Problem: Self-Care: Goal: Ability to participate in self-care as condition permits will improve Outcome: Progressing Goal: Verbalization of feelings and concerns over difficulty with self-care will improve Outcome: Progressing Goal: Ability to communicate needs accurately will improve Outcome: Progressing   Problem: Nutrition: Goal: Risk of aspiration will decrease Outcome: Progressing Goal: Dietary intake will improve Outcome: Progressing   Problem: Education: Goal: Ability to describe self-care measures that may prevent or decrease complications (Diabetes Survival Skills Education) will improve Outcome: Progressing Goal: Individualized Educational Video(s) Outcome: Progressing   Problem: Fluid Volume: Goal: Ability to maintain a balanced intake and output will improve Outcome: Progressing   Problem: Health Behavior/Discharge Planning: Goal:  Ability to identify and utilize available resources and services will improve Outcome: Progressing Goal: Ability to manage health-related needs will improve Outcome: Progressing   Problem: Skin Integrity: Goal: Risk for impaired skin integrity will decrease Outcome: Progressing   Problem: Tissue Perfusion: Goal: Adequacy of tissue perfusion will improve Outcome: Progressing   Problem: Education: Goal: Knowledge of General Education information will improve Description: Including pain rating scale, medication(s)/side effects and non-pharmacologic comfort measures Outcome: Progressing   Problem: Clinical Measurements: Goal: Ability to maintain clinical measurements within normal limits will improve Outcome: Progressing Goal: Will remain free from infection Outcome: Progressing Goal: Diagnostic test results will improve Outcome: Progressing Goal: Respiratory complications will improve Outcome: Progressing Goal: Cardiovascular complication will be avoided Outcome: Progressing   Problem: Activity: Goal: Risk for activity intolerance will decrease Outcome: Progressing   Problem: Nutrition: Goal: Adequate nutrition will be maintained Outcome: Progressing   Problem: Pain Management: Goal: General experience of comfort will improve Outcome: Progressing   Problem: Elimination: Goal: Will not experience complications related to bowel motility Outcome: Progressing Goal: Will not experience complications related to urinary retention Outcome: Progressing   Problem: Skin Integrity: Goal: Risk for impaired skin integrity will decrease Outcome: Progressing

## 2023-06-19 NOTE — Assessment & Plan Note (Signed)
On Celexa 

## 2023-06-19 NOTE — Evaluation (Addendum)
Occupational Therapy Evaluation Patient Details Name: [name] MRN: [id number] DOB: 12/01/1950 Today's Date: 06/19/2023   History of Present Illness 72 y.o. male with PMHx of chronic diastolic congestive heart failure, COPD, type 2 diabetes, essential hypertension, cirrhosis, depression, obesity, sleep apnea not on CPAP, homelessness who lives on the street who came to the hospital with complaints of dysuria, was found to have E. coli bacteremia, feeling dizzy/lightheaded with falls.   Clinical Impression   Pt was seen for OT evaluation this date. Prior to hospital admission, pt reports being homeless and sleeping in a tent. He used a rollator for mobility, but was limited to 20-30 feet before he was at increased risk of falls. Reports using motorized carts in stores d/t not being able to ambulate long distance. IND with ADLs.    Pt presents to acute OT demonstrating impaired ADL performance and functional mobility 2/2 weakness, pain, mild balance deficits (See OT problem list for additional functional deficits). Pt currently requires MOD I for bed mobility using bedrails. BP was checked in supine 178/70, sitting EOB 186/74 and following ambulation to the bathroom for toileting needs then return to bed. BP increased to 196/72 with nurse notified, however pt was moving his arm to take pain meds and talking. Allowed pt a few minutes to rest in bed then rechecked in supine with BP of 159/68. HR remained in the 50's. CGA provided usin RW for all mobility and all toileting tasks. Pt would benefit from skilled OT services to address noted impairments and functional limitations (see below for any additional details) in order to maximize safety and independence while minimizing falls risk and caregiver burden. Do anticipate the need for follow up OT services upon acute hospital DC, recommend SNF d/t ongoing falls and limited mobility with high levels of pain currently. *Addendum-May progress to [facility]  services if can find a home to go to.        If plan is discharge home, recommend the following: A little help with walking and/or transfers;A little help with bathing/dressing/bathroom;Assistance with cooking/housework;Help with stairs or ramp for entrance    Functional Status Assessment  Patient has had a recent decline in their functional status and demonstrates the ability to make significant improvements in function in a reasonable and predictable amount of time.  Equipment Recommendations  None recommended by OT    Recommendations for Other Services       Precautions / Restrictions Precautions Precautions: Fall Restrictions Weight Bearing Restrictions: No      Mobility Bed Mobility Overal bed mobility: Needs Assistance Bed Mobility: Supine to Sit, Sit to Supine     Supine to sit: Modified independent (Device/Increase time) Sit to supine: Modified independent (Device/Increase time)   General bed mobility comments: MOD I for bed mobility using bed rails and HOB elevated, no physical assist provided    Transfers Overall transfer level: Needs assistance Equipment used: Rolling walker (2 wheels) Transfers: Sit to/from Stand Sit to Stand: Contact guard assist           General transfer comment: CGA for STS from EOB and toilet and CGA for mobility in room usin RW      Balance Overall balance assessment: Needs assistance   Sitting balance-Leahy Scale: Good Sitting balance - Comments: good seated balance at EOB with unilateral support   Standing balance support: Bilateral upper extremity supported, Reliant on assistive device for balance, During functional activity Standing balance-Leahy Scale: Fair Standing balance comment: CGA using RW for mobility, able to  stand and perform toileting tasks e.g. clothing management with CGA/SBA                           ADL either performed or assessed with clinical judgement   ADL Overall ADL's : Needs  assistance/impaired     Grooming: Wash/dry hands;Contact guard assist;Standing                   Toilet Transfer: Contact guard assist;Rolling walker (2 wheels);Regular Toilet;Grab bars   Toileting- Clothing Manipulation and Hygiene: Contact guard assist;Sit to/from stand       Functional mobility during ADLs: Contact guard assist;Rolling walker (2 wheels)       Vision         Perception         Praxis         Pertinent Vitals/Pain Pain Assessment Pain Assessment: 0-10 Pain Score: 10-Worst pain ever Pain Location: back, beck and abdomen Pain Descriptors / Indicators: Sore Pain Intervention(s): Monitored during session, Limited activity within patient's tolerance, RN gave pain meds during session     Extremity/Trunk Assessment Upper Extremity Assessment Upper Extremity Assessment: Generalized weakness (tremors and weak grip strength, drops things)   Lower Extremity Assessment Lower Extremity Assessment: Generalized weakness       Communication Communication Communication: No apparent difficulties   Cognition Arousal: Alert Behavior During Therapy: WFL for tasks assessed/performed Overall Cognitive Status: Within Functional Limits for tasks assessed                                       General Comments       Exercises Other Exercises Other Exercises: Educated on role of OT, pacing and safety with performance of all tasks to prevent falls, taking breaks as needed for COPD.   Shoulder Instructions      Home Living Family/patient expects to be discharged to:: Shelter/Homeless                                 Additional Comments: lives in a tent behind [employer] since his family was kicked out of a house      Prior Functioning/Environment Prior Level of Function : Independent/Modified Independent             Mobility Comments: walks with a rollator in the community. takes the bus for transportation. Only  tolerates 20-37ft bouts AMB at baseline with resting breaks due to COPD. uses motorized cart in grocery stores d/t long distance mobility ADLs Comments: Mod I, uses a bed side commode in the tent for toileting        OT Problem List: Decreased strength;Pain;Decreased activity tolerance;Impaired balance (sitting and/or standing)      OT Treatment/Interventions: Self-care/ADL training;Therapeutic exercise;Therapeutic activities;Energy conservation;DME and/or AE instruction;Patient/family education;Balance training    OT Goals(Current goals can be found in the care plan section) Acute Rehab OT Goals Patient Stated Goal: improve his strength and decrease his pain OT Goal Formulation: With patient Time For Goal Achievement: 07/03/23 Potential to Achieve Goals: Good ADL Goals Pt Will Perform Lower Body Bathing: with supervision;sitting/lateral leans;sit to/from stand Pt Will Perform Lower Body Dressing: with modified independence;sitting/lateral leans;sit to/from stand Pt Will Transfer to Toilet: with supervision;ambulating;grab bars;regular height toilet Pt Will Perform Toileting - Clothing Manipulation and hygiene: with modified independence;sit to/from stand Additional  ADL Goal #1: Pt will dmeo/verbalize use of EC, PLB, and compensatory strategies during ADL performance to ensure safety and IND with tasks and prevention of falls and exertion.  OT Frequency: Min 1X/week    Co-evaluation              AM-PAC OT "6 Clicks" Daily Activity     Outcome Measure Help from another person eating meals?: None Help from another person taking care of personal grooming?: None Help from another person toileting, which includes using toliet, bedpan, or urinal?: A Little Help from another person bathing (including washing, rinsing, drying)?: A Little Help from another person to put on and taking off regular upper body clothing?: None Help from another person to put on and taking off regular lower  body clothing?: A Little 6 Click Score: 21   End of Session Equipment Utilized During Treatment: Rolling walker (2 wheels) Nurse Communication: Mobility status  Activity Tolerance: Patient tolerated treatment well Patient left: in bed;with call bell/phone within reach;with bed alarm set  OT Visit Diagnosis: Unsteadiness on feet (R26.81);Other abnormalities of gait and mobility (R26.89);History of falling (Z91.81);Pain                Time: 1610-9604 OT Time Calculation (min): 30 min Charges:  OT General Charges $OT Visit: 1 Visit OT Evaluation $OT Eval Low Complexity: 1 Low OT Treatments $Self Care/Home Management : 8-22 mins [name], OTR/L 06/19/23, 12:24 PM [name] 06/19/2023, 12:20 PM

## 2023-06-20 ENCOUNTER — Inpatient Hospital Stay: Payer: 59

## 2023-06-20 DIAGNOSIS — I10 Essential (primary) hypertension: Secondary | ICD-10-CM | POA: Diagnosis not present

## 2023-06-20 DIAGNOSIS — M5412 Radiculopathy, cervical region: Secondary | ICD-10-CM

## 2023-06-20 DIAGNOSIS — R7881 Bacteremia: Secondary | ICD-10-CM | POA: Diagnosis not present

## 2023-06-20 DIAGNOSIS — K7469 Other cirrhosis of liver: Secondary | ICD-10-CM

## 2023-06-20 LAB — CBC
HCT: 33.7 % — ABNORMAL LOW (ref 39.0–52.0)
Hemoglobin: 11.2 g/dL — ABNORMAL LOW (ref 13.0–17.0)
MCH: 30.8 pg (ref 26.0–34.0)
MCHC: 33.2 g/dL (ref 30.0–36.0)
MCV: 92.6 fL (ref 80.0–100.0)
Platelets: 76 10*3/uL — ABNORMAL LOW (ref 150–400)
RBC: 3.64 MIL/uL — ABNORMAL LOW (ref 4.22–5.81)
RDW: 17.7 % — ABNORMAL HIGH (ref 11.5–15.5)
WBC: 2 10*3/uL — ABNORMAL LOW (ref 4.0–10.5)
nRBC: 0 % (ref 0.0–0.2)

## 2023-06-20 LAB — PROTIME-INR
INR: 1.2 (ref 0.8–1.2)
Prothrombin Time: 15.8 s — ABNORMAL HIGH (ref 11.4–15.2)

## 2023-06-20 LAB — BASIC METABOLIC PANEL
Anion gap: 5 (ref 5–15)
BUN: 16 mg/dL (ref 8–23)
CO2: 25 mmol/L (ref 22–32)
Calcium: 8.9 mg/dL (ref 8.9–10.3)
Chloride: 110 mmol/L (ref 98–111)
Creatinine, Ser: 1 mg/dL (ref 0.61–1.24)
GFR, Estimated: >60 mL/min (ref >60)
Glucose, Bld: 64 mg/dL — ABNORMAL LOW (ref 70–99)
Potassium: 4.2 mmol/L (ref 3.5–5.1)
Sodium: 140 mmol/L (ref 135–145)

## 2023-06-20 LAB — GLUCOSE, CAPILLARY
Glucose-Capillary: 104 mg/dL — ABNORMAL HIGH (ref 70–99)
Glucose-Capillary: 55 mg/dL — ABNORMAL LOW (ref 70–99)
Glucose-Capillary: 148 mg/dL — ABNORMAL HIGH (ref 70–99)
Glucose-Capillary: 114 mg/dL — ABNORMAL HIGH (ref 70–99)
Glucose-Capillary: 101 mg/dL — ABNORMAL HIGH (ref 70–99)

## 2023-06-20 LAB — AMMONIA: Ammonia: 81 umol/L — ABNORMAL HIGH (ref 9–35)

## 2023-06-20 MED ORDER — GADOBUTROL 1 MMOL/ML IV SOLN
10.0000 mL | Freq: Once | INTRAVENOUS | Status: AC | PRN
Start: 2023-06-20 — End: 2023-06-20
  Administered 2023-06-20: 10 mL via INTRAVENOUS

## 2023-06-20 MED ORDER — LOPERAMIDE HCL 2 MG PO CAPS
2.0000 mg | ORAL_CAPSULE | Freq: Three times a day (TID) | ORAL | Status: DC | PRN
  Administered 2023-06-20: 2 mg via ORAL
  Filled 2023-06-20: qty 1

## 2023-06-20 MED ORDER — LORAZEPAM 2 MG/ML IJ SOLN
0.5000 mg | Freq: Once | INTRAMUSCULAR | Status: AC | PRN
  Administered 2023-06-20: 0.5 mg via INTRAVENOUS
  Filled 2023-06-20: qty 1

## 2023-06-20 MED ORDER — AMLODIPINE BESYLATE 5 MG PO TABS
5.0000 mg | ORAL_TABLET | Freq: Every day | ORAL | Status: DC
  Administered 2023-06-20: 5 mg via ORAL
  Filled 2023-06-20: qty 1

## 2023-06-20 NOTE — Assessment & Plan Note (Addendum)
Case discussed with Dr. Marcell Barlow neurosurgery and he will follow up as outpatient for potential surgical intervention.  Patient is childs Pugh class B.

## 2023-06-20 NOTE — Plan of Care (Signed)
  Problem: Fluid Volume: Goal: Hemodynamic stability will improve Outcome: Progressing   Problem: Clinical Measurements: Goal: Diagnostic test results will improve Outcome: Progressing Goal: Signs and symptoms of infection will decrease Outcome: Progressing   Problem: Respiratory: Goal: Ability to maintain adequate ventilation will improve Outcome: Progressing   Problem: Education: Goal: Knowledge of disease or condition will improve Outcome: Progressing Goal: Knowledge of secondary prevention will improve (MUST DOCUMENT ALL) Outcome: Progressing Goal: Knowledge of patient specific risk factors will improve Loraine Leriche N/A or DELETE if not current risk factor) Outcome: Progressing   Problem: Ischemic Stroke/TIA Tissue Perfusion: Goal: Complications of ischemic stroke/TIA will be minimized Outcome: Progressing   Problem: Coping: Goal: Will verbalize positive feelings about self Outcome: Progressing Goal: Will identify appropriate support needs Outcome: Progressing   Problem: Health Behavior/Discharge Planning: Goal: Ability to manage health-related needs will improve Outcome: Progressing Goal: Goals will be collaboratively established with patient/family Outcome: Progressing   Problem: Self-Care: Goal: Ability to participate in self-care as condition permits will improve Outcome: Progressing Goal: Verbalization of feelings and concerns over difficulty with self-care will improve Outcome: Progressing Goal: Ability to communicate needs accurately will improve Outcome: Progressing   Problem: Nutrition: Goal: Risk of aspiration will decrease Outcome: Progressing Goal: Dietary intake will improve Outcome: Progressing   Problem: Education: Goal: Ability to describe self-care measures that may prevent or decrease complications (Diabetes Survival Skills Education) will improve Outcome: Progressing Goal: Individualized Educational Video(s) Outcome: Progressing   Problem:  Coping: Goal: Ability to adjust to condition or change in health will improve Outcome: Progressing   Problem: Fluid Volume: Goal: Ability to maintain a balanced intake and output will improve Outcome: Progressing   Problem: Health Behavior/Discharge Planning: Goal: Ability to identify and utilize available resources and services will improve Outcome: Progressing Goal: Ability to manage health-related needs will improve Outcome: Progressing   Problem: Metabolic: Goal: Ability to maintain appropriate glucose levels will improve Outcome: Progressing   Problem: Nutritional: Goal: Maintenance of adequate nutrition will improve Outcome: Progressing Goal: Progress toward achieving an optimal weight will improve Outcome: Progressing   Problem: Skin Integrity: Goal: Risk for impaired skin integrity will decrease Outcome: Progressing   Problem: Tissue Perfusion: Goal: Adequacy of tissue perfusion will improve Outcome: Progressing   Problem: Education: Goal: Knowledge of General Education information will improve Description: Including pain rating scale, medication(s)/side effects and non-pharmacologic comfort measures Outcome: Progressing   Problem: Health Behavior/Discharge Planning: Goal: Ability to manage health-related needs will improve Outcome: Progressing   Problem: Clinical Measurements: Goal: Ability to maintain clinical measurements within normal limits will improve Outcome: Progressing Goal: Will remain free from infection Outcome: Progressing Goal: Diagnostic test results will improve Outcome: Progressing Goal: Respiratory complications will improve Outcome: Progressing Goal: Cardiovascular complication will be avoided Outcome: Progressing   Problem: Activity: Goal: Risk for activity intolerance will decrease Outcome: Progressing   Problem: Nutrition: Goal: Adequate nutrition will be maintained Outcome: Progressing   Problem: Coping: Goal: Level of  anxiety will decrease Outcome: Progressing   Problem: Elimination: Goal: Will not experience complications related to bowel motility Outcome: Progressing Goal: Will not experience complications related to urinary retention Outcome: Progressing   Problem: Pain Management: Goal: General experience of comfort will improve Outcome: Progressing   Problem: Safety: Goal: Ability to remain free from injury will improve Outcome: Progressing   Problem: Skin Integrity: Goal: Risk for impaired skin integrity will decrease Outcome: Progressing

## 2023-06-20 NOTE — Progress Notes (Signed)
Physical Therapy Treatment Patient Details Name: [name] MRN: [id number] DOB: [birth year] Today's Date: 06/20/2023   History of Present Illness 72 y.o. male with PMHx of chronic diastolic congestive heart failure, COPD, type 2 diabetes, essential hypertension, cirrhosis, depression, obesity, sleep apnea not on CPAP, homelessness who lives on the street who came to the hospital with complaints of dysuria, was found to have E. coli bacteremia, feeling dizzy/lightheaded with falls.    PT Comments  Pt received at EOB and agreed to PT session. Pt reports feeling better today in comparison to yesterday as he recently received medication. Pt performed STS with the use of RW (2wheels) CGA. Pt amb in room from EOB to door 2x CGA without reports of s/sx relative to dizziness. During the last walk, on the way back to EOB, pt reported pain in the back of his head and in his spine on a scale of 9/10. Vitals at the beginning of session read: 99% Spo2 on RA, 52 bpm, and 138/66 mmHg while seated EOB. Vitals at the end of session read: 99% Spo2 on RA, 54 bpm, 133/70 mmHg while seated EOB. Pt tolerated Tx well today and will continue to benefit from skilled PT sessions in order to improve strength, endurance, activity tolerance, and functional mobility.    If plan is discharge home, recommend the following: A little help with bathing/dressing/bathroom;Assist for transportation;Help with stairs or ramp for entrance;A little help with walking and/or transfers   Can travel by private vehicle        Equipment Recommendations  Rolling walker (2 wheels)    Recommendations for Other Services       Precautions / Restrictions Precautions Precautions: Fall Restrictions Weight Bearing Restrictions: No     Mobility  Bed Mobility               General bed mobility comments: Pt recieved at EOB. Bed mobility deferred.    Transfers Overall transfer level: Needs assistance Equipment used: Rolling  walker (2 wheels) Transfers: Sit to/from Stand Sit to Stand: Contact guard assist           General transfer comment: Pt performed STS CGA with the use of RW (2wheels).    Ambulation/Gait Ambulation/Gait assistance: Contact guard assist Gait Distance (Feet): 80 Feet Assistive device: Rolling walker (2 wheels) Gait Pattern/deviations: Step-through pattern Gait velocity: decreased     General Gait Details: Pt amb CGA from EOB to bedroom door x2 with the use of RW (2wheels). While returning back to bed during the 2nd walk, pt reported posterior head and back discomfort on a scale of 9/10.   Stairs             Wheelchair Mobility     Tilt Bed    Modified Rankin (Stroke Patients Only)       Balance Overall balance assessment: Needs assistance Sitting-balance support: Feet supported Sitting balance-Leahy Scale: Fair Sitting balance - Comments: Pt presented with improved seated balance today. Pt did not report sensing instability or LOB.   Standing balance support: Bilateral upper extremity supported Standing balance-Leahy Scale: Fair                              Cognition Arousal: Alert Behavior During Therapy: WFL for tasks assessed/performed Overall Cognitive Status: Within Functional Limits for tasks assessed  General Comments: AOx4. Pt pleasant and willing to participate in PT session.        Exercises      General Comments        Pertinent Vitals/Pain Pain Assessment Pain Assessment: No/denies pain Pain Location: back, back of head and abdomen Pain Descriptors / Indicators: Sore, Pressure Pain Intervention(s): Monitored during session    Home Living                          Prior Function            PT Goals (current goals can now be found in the care plan section) Acute Rehab PT Goals Patient Stated Goal: To decrease pain. PT Goal Formulation: With patient Time For  Goal Achievement: 07/03/23 Potential to Achieve Goals: Good Progress towards PT goals: Progressing toward goals    Frequency    Min 1X/week      PT Plan      Co-evaluation              AM-PAC PT "6 Clicks" Mobility   Outcome Measure  Help needed turning from your back to your side while in a flat bed without using bedrails?: A Little Help needed moving from lying on your back to sitting on the side of a flat bed without using bedrails?: A Little Help needed moving to and from a bed to a chair (including a wheelchair)?: A Little Help needed standing up from a chair using your arms (e.g., wheelchair or bedside chair)?: A Little Help needed to walk in hospital room?: A Little Help needed climbing 3-5 steps with a railing? : A Little 6 Click Score: 18    End of Session Equipment Utilized During Treatment: Gait belt Activity Tolerance: Patient tolerated treatment well Patient left: in bed;with bed alarm set;with call bell/phone within reach Nurse Communication: Mobility status PT Visit Diagnosis: Unsteadiness on feet (R26.81);Other abnormalities of gait and mobility (R26.89);Repeated falls (R29.6);Muscle weakness (generalized) (M62.81);History of falling (Z91.81);Difficulty in walking, not elsewhere classified (R26.2);Other symptoms and signs involving the nervous system (R29.898);Pain Pain - part of body:  (Back of head and spine.)     Time: 1610-9604 PT Time Calculation (min) (ACUTE ONLY): 21 min  Charges:    $Gait Training: 8-22 mins PT General Charges $$ ACUTE PT VISIT: 1 Visit                     Rashawn Rolon Sauvignon Howard SPT, LAT, ATC   Jeweline Reif Sauvignon-Howard 06/20/2023, 1:57 PM

## 2023-06-20 NOTE — TOC Progression Note (Signed)
Transition of Care [facility]) - Progression Note    Patient Details  Name: [name] MRN: [id number] Date of Birth: [birth year]  Transition of Care [facility]) CM/SW Contact  Chapman Fitch, RN Phone Number: 06/20/2023, 11:12 AM  Clinical Narrative:      Per MD potential DC tomorrow Doristine Mango at [facility]  updated  Patient in agreement to RW and states he does not have a preference of DME agency.  Referral  made to Jon with [employer].       Expected Discharge Plan and Services                                               Social Determinants of Health (SDOH) Interventions SDOH Screenings   Food Insecurity: Food Insecurity Present (06/18/2023)  Housing: High Risk (06/18/2023)  Transportation Needs: Unmet Transportation Needs (06/18/2023)  Utilities: At Risk (06/18/2023)  Alcohol Screen: Low Risk  (09/24/2022)  Depression (PHQ2-9): High Risk (04/26/2022)  Financial Resource Strain: High Risk (04/27/2022)  Physical Activity: Sufficiently Active (04/16/2018)  Social Connections: Moderately Isolated (04/30/2018)  Stress: Stress Concern Present (04/16/2018)  Tobacco Use: Medium Risk (06/18/2023)    Readmission Risk Interventions    06/18/2023   12:36 PM 11/07/2022    1:45 PM  Readmission Risk Prevention Plan  Transportation Screening Complete Complete  Medication Review [employer]) Complete   PCP or Specialist appointment within 3-5 days of discharge Complete   HRI or Home Care Consult Complete   SW Recovery Care/Counseling Consult Not Complete Complete  Palliative Care Screening Not Complete Not Applicable  Skilled Nursing Facility Complete Not Applicable

## 2023-06-20 NOTE — Progress Notes (Signed)
Occupational Therapy Treatment Patient Details Name: [name] MRN: [id number] DOB: Jan 12, 1951 Today's Date: 06/20/2023   History of present illness 72 y.o. male with PMHx of chronic diastolic congestive heart failure, COPD, type 2 diabetes, essential hypertension, cirrhosis, depression, obesity, sleep apnea not on CPAP, homelessness who lives on the street who came to the hospital with complaints of dysuria, was found to have E. coli bacteremia, feeling dizzy/lightheaded with falls.   OT comments  Pt is supine in bed on arrival. Easily arousable and agreeable to OT session. He continues to have some pain in his neck and back, but reports the pain meds have helped a lot. Pt initially seen and needing to use the bathroom and wanted to eat his lunch tray. Pt performed bed mobility with MOD I and ambulated to the bathroom using RW with SBA. He stood at toilet with SBA and sink for hand hygiene with no LOB noted. Noted to have urinated on his pants leg and socks, therefore doffed items with SBA and left in gown at EOB to eat his lunch. OT gathered all items for bathing, clothing and linen change. OT returned a second time and pt engaged in UB/LB bathing and dressing session while standing at sink by bed and intermittent bouts of sitting for LB dressing to don socks and mesh underwear. SBA for UB and LB ADLs in standing. Pt returned to bed with all needs in place and will cont to require skilled acute OT services to maximize his safety and IND to return to PLOF. Pt could benefit from [facility] services if he finds a home to return to.       If plan is discharge home, recommend the following:  A little help with walking and/or transfers;A little help with bathing/dressing/bathroom;Assistance with cooking/housework;Help with stairs or ramp for entrance   Equipment Recommendations  None recommended by OT    Recommendations for Other Services      Precautions / Restrictions Precautions Precautions:  Fall Restrictions Weight Bearing Restrictions: No       Mobility Bed Mobility Overal bed mobility: Modified Independent                  Transfers Overall transfer level: Needs assistance Equipment used: Rolling walker (2 wheels) Transfers: Sit to/from Stand Sit to Stand: Supervision           General transfer comment: SBA for STS from EOB multiple times for bathing and dressing tasks     Balance Overall balance assessment: Needs assistance Sitting-balance support: Feet supported Sitting balance-Leahy Scale: Good     Standing balance support: No upper extremity supported Standing balance-Leahy Scale: Fair                             ADL either performed or assessed with clinical judgement   ADL Overall ADL's : Needs assistance/impaired Eating/Feeding: Set up   Grooming: Wash/dry face;Standing   Upper Body Bathing: Supervision/ safety;Standing   Lower Body Bathing: Supervison/ safety;Sit to/from stand   Upper Body Dressing : Standing;Supervision/safety   Lower Body Dressing: Sit to/from stand;Supervision/safety   Toilet Transfer: Supervision/safety;Rolling walker (2 wheels);Regular Toilet           Functional mobility during ADLs: Supervision/safety;Rolling walker (2 wheels)      Extremity/Trunk Assessment Upper Extremity Assessment Upper Extremity Assessment: Generalized weakness   Lower Extremity Assessment Lower Extremity Assessment: Generalized weakness        Vision  Perception     Praxis      Cognition Arousal: Alert Behavior During Therapy: WFL for tasks assessed/performed Overall Cognitive Status: Within Functional Limits for tasks assessed                                          Exercises      Shoulder Instructions       General Comments      Pertinent Vitals/ Pain       Pain Assessment Pain Assessment: No/denies pain  Home Living                                           Prior Functioning/Environment              Frequency  Min 1X/week        Progress Toward Goals  OT Goals(current goals can now be found in the care plan section)  Progress towards OT goals: Progressing toward goals  Acute Rehab OT Goals Patient Stated Goal: find a home to return to and have decreased pain OT Goal Formulation: With patient Time For Goal Achievement: 07/03/23 Potential to Achieve Goals: Good  Plan      Co-evaluation                 AM-PAC OT "6 Clicks" Daily Activity     Outcome Measure   Help from another person eating meals?: None Help from another person taking care of personal grooming?: None Help from another person toileting, which includes using toliet, bedpan, or urinal?: A Little Help from another person bathing (including washing, rinsing, drying)?: A Little Help from another person to put on and taking off regular upper body clothing?: None Help from another person to put on and taking off regular lower body clothing?: A Little 6 Click Score: 21    End of Session Equipment Utilized During Treatment: Rolling walker (2 wheels)  OT Visit Diagnosis: Unsteadiness on feet (R26.81);Other abnormalities of gait and mobility (R26.89);History of falling (Z91.81);Pain   Activity Tolerance Patient tolerated treatment well   Patient Left in bed;with call bell/phone within reach;with bed alarm set   Nurse Communication Mobility status        Time: 6962-9528 OT Time Calculation (min): 30 min  Charges: OT General Charges $OT Visit: 1 Visit OT Treatments $Self Care/Home Management : 23-37 mins  Shondell Fabel, OTR/L  06/20/23, 3:38 PM  Emmert Roethler E Peggye Poon 06/20/2023, 3:33 PM

## 2023-06-20 NOTE — Progress Notes (Signed)
Progress Note   Patient: [name]:[id number] DOB: [birth year] DOA: 06/17/2023     3 DOS: the patient was seen and examined on 06/20/2023   Brief hospital course: [name] is a 72 y.o. male with medical history significant of chronic diastolic congestive heart failure, COPD, type 2 diabetes, essential hypertension, obesity, sleep apnea not on CPAP, homelessness who lives on the street who came to the hospital with complaints of dysuria, was found to have E. coli bacteremia.   10/23.  Patient having some falls, patient having abdominal pain and weight loss.  Since urine analysis negative we will get a CAT scan of the abdomen pelvis to see if we have a source of the E. coli bacteremia. 10/24.  Patient having falls at home and weakness bilateral arms and legs.  MRI of cervical spine and lumbar spine ordered.  Assessment and Plan: * E coli bacteremia Currently on Rocephin.  Case discussed with ID pharmacist and can prescribe Augmentin upon going home and will give a total course of 10 days.  Cervical radiculopathy Case discussed with Dr. Marcell Barlow neurosurgery to evaluate.  Liver cirrhosis (HCC) Pancytopenia.  Ammonia level 81.  Continue Xifaxan.  HTN (hypertension) Continue losartan and spironolactone.  Added Norvasc  Chronic diastolic CHF (congestive heart failure) (HCC) Continue losartan and spironolactone.  Diabetes mellitus without complication (HCC) Sugars are on the lower side.  Hold metformin.  Sliding scale insulin.  Hemoglobin A1c 5.1.  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        Subjective: Patient complains of weakness of his arms and hands and legs.  Has a falls as outpatient.  Admitted with E. coli bacteremia.  Physical Exam: Vitals:   06/19/23 1852 06/20/23 0417 06/20/23 0821 06/20/23 1536  BP:  (!) 160/68 (!) 175/76 (!) 188/89 (!) 140/68  Pulse: (!) 52 (!) 52 (!) 49 (!) 48  Resp: 16 18 17 17   Temp: 98.9 F (37.2 C) 97.9 F (36.6 C) 97.8 F (36.6 C) 98.2 F (36.8 C)  TempSrc: Oral Oral    SpO2: 100% 100% 100% 100%  Height:       Physical Exam HENT:     Head: Normocephalic.     Mouth/Throat:     Pharynx: No oropharyngeal exudate.  Eyes:     General: Lids are normal.     Conjunctiva/sclera: Conjunctivae normal.  Cardiovascular:     Rate and Rhythm: Normal rate and regular rhythm.     Heart sounds: Normal heart sounds, S1 normal and S2 normal.  Pulmonary:     Breath sounds: No decreased breath sounds, wheezing, rhonchi or rales.  Abdominal:     Palpations: Abdomen is soft.     Tenderness: There is abdominal tenderness in the left lower quadrant.  Musculoskeletal:     Right lower leg: No swelling.     Left lower leg: No swelling.  Skin:    General: Skin is warm.     Findings: No rash.  Neurological:     Mental Status: He is alert and oriented to person, place, and time.     Comments: Able to straight leg raise.     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    Disposition: Status  is: Inpatient Remains inpatient appropriate because: Case discussed with neurosurgery to evaluate patient and set up a outpatient plan.  Planned Discharge Destination: Home with Home Health    Time spent: 28 minutes  Author: Alford Highland, MD 06/20/2023 4:46 PM  For on call review [email or web address].

## 2023-06-21 ENCOUNTER — Other Ambulatory Visit: Payer: Self-pay

## 2023-06-21 ENCOUNTER — Telehealth: Payer: Self-pay

## 2023-06-21 DIAGNOSIS — G959 Disease of spinal cord, unspecified: Secondary | ICD-10-CM | POA: Diagnosis not present

## 2023-06-21 DIAGNOSIS — R531 Weakness: Secondary | ICD-10-CM

## 2023-06-21 DIAGNOSIS — M4802 Spinal stenosis, cervical region: Secondary | ICD-10-CM

## 2023-06-21 DIAGNOSIS — Z01818 Encounter for other preprocedural examination: Secondary | ICD-10-CM

## 2023-06-21 DIAGNOSIS — M5412 Radiculopathy, cervical region: Secondary | ICD-10-CM | POA: Diagnosis not present

## 2023-06-21 DIAGNOSIS — R7881 Bacteremia: Secondary | ICD-10-CM | POA: Diagnosis not present

## 2023-06-21 DIAGNOSIS — I10 Essential (primary) hypertension: Secondary | ICD-10-CM | POA: Diagnosis not present

## 2023-06-21 DIAGNOSIS — K7469 Other cirrhosis of liver: Secondary | ICD-10-CM | POA: Diagnosis not present

## 2023-06-21 LAB — COMPREHENSIVE METABOLIC PANEL
ALT: 30 U/L (ref 0–44)
AST: 40 U/L (ref 15–41)
Albumin: 2.4 g/dL — ABNORMAL LOW (ref 3.5–5.0)
Alkaline Phosphatase: 79 U/L (ref 38–126)
Anion gap: 4 — ABNORMAL LOW (ref 5–15)
BUN: 15 mg/dL (ref 8–23)
CO2: 27 mmol/L (ref 22–32)
Calcium: 8.8 mg/dL — ABNORMAL LOW (ref 8.9–10.3)
Chloride: 105 mmol/L (ref 98–111)
Creatinine, Ser: 0.98 mg/dL (ref 0.61–1.24)
GFR, Estimated: >60 mL/min (ref >60)
Glucose, Bld: 79 mg/dL (ref 70–99)
Potassium: 4.2 mmol/L (ref 3.5–5.1)
Sodium: 136 mmol/L (ref 135–145)
Total Bilirubin: 1 mg/dL (ref 0.3–1.2)
Total Protein: 6.2 g/dL — ABNORMAL LOW (ref 6.5–8.1)

## 2023-06-21 LAB — CBC
HCT: 35 % — ABNORMAL LOW (ref 39.0–52.0)
Hemoglobin: 11.6 g/dL — ABNORMAL LOW (ref 13.0–17.0)
MCH: 30.5 pg (ref 26.0–34.0)
MCHC: 33.1 g/dL (ref 30.0–36.0)
MCV: 92.1 fL (ref 80.0–100.0)
Platelets: 75 10*3/uL — ABNORMAL LOW (ref 150–400)
RBC: 3.8 MIL/uL — ABNORMAL LOW (ref 4.22–5.81)
RDW: 17.6 % — ABNORMAL HIGH (ref 11.5–15.5)
WBC: 2 10*3/uL — ABNORMAL LOW (ref 4.0–10.5)
nRBC: 0 % (ref 0.0–0.2)

## 2023-06-21 LAB — GLUCOSE, CAPILLARY
Glucose-Capillary: 105 mg/dL — ABNORMAL HIGH (ref 70–99)
Glucose-Capillary: 87 mg/dL (ref 70–99)

## 2023-06-21 MED ORDER — PANTOPRAZOLE SODIUM 40 MG PO TBEC
40.0000 mg | DELAYED_RELEASE_TABLET | Freq: Every day | ORAL | 0 refills | Status: DC
  Filled 2023-06-21: qty 30, 30d supply, fill #0

## 2023-06-21 MED ORDER — AMOXICILLIN-POT CLAVULANATE 875-125 MG PO TABS
1.0000 | ORAL_TABLET | Freq: Two times a day (BID) | ORAL | 0 refills | Status: AC
  Filled 2023-06-21: qty 11, 6d supply, fill #0

## 2023-06-21 MED ORDER — ONDANSETRON HCL 4 MG PO TABS
4.0000 mg | ORAL_TABLET | Freq: Four times a day (QID) | ORAL | 0 refills | Status: DC | PRN
  Filled 2023-06-21: qty 20, 5d supply, fill #0

## 2023-06-21 MED ORDER — FUROSEMIDE 20 MG PO TABS
20.0000 mg | ORAL_TABLET | Freq: Every day | ORAL | 0 refills | Status: DC
  Filled 2023-06-21: qty 30, 30d supply, fill #0

## 2023-06-21 MED ORDER — ATORVASTATIN CALCIUM 40 MG PO TABS
40.0000 mg | ORAL_TABLET | Freq: Every day | ORAL | 0 refills | Status: DC
  Filled 2023-06-21: qty 30, 30d supply, fill #0

## 2023-06-21 MED ORDER — SPIRONOLACTONE 25 MG PO TABS
12.5000 mg | ORAL_TABLET | Freq: Every day | ORAL | 0 refills | Status: DC
  Filled 2023-06-21: qty 30, 60d supply, fill #0

## 2023-06-21 MED ORDER — OXYCODONE-ACETAMINOPHEN 5-325 MG PO TABS
1.0000 | ORAL_TABLET | Freq: Four times a day (QID) | ORAL | 0 refills | Status: DC | PRN
  Filled 2023-06-21: qty 10, 3d supply, fill #0

## 2023-06-21 MED ORDER — LOSARTAN POTASSIUM 50 MG PO TABS
50.0000 mg | ORAL_TABLET | Freq: Every day | ORAL | 0 refills | Status: DC
  Filled 2023-06-21: qty 30, 30d supply, fill #0

## 2023-06-21 MED ORDER — EMPAGLIFLOZIN 10 MG PO TABS
10.0000 mg | ORAL_TABLET | Freq: Every day | ORAL | 0 refills | Status: DC
  Filled 2023-06-21: qty 30, 30d supply, fill #0

## 2023-06-21 MED ORDER — QUETIAPINE FUMARATE 50 MG PO TABS
50.0000 mg | ORAL_TABLET | Freq: Every day | ORAL | 0 refills | Status: DC
  Filled 2023-06-21: qty 30, 30d supply, fill #0

## 2023-06-21 MED ORDER — AMOXICILLIN-POT CLAVULANATE 875-125 MG PO TABS
1.0000 | ORAL_TABLET | Freq: Two times a day (BID) | ORAL | Status: DC
  Administered 2023-06-21: 1 via ORAL
  Filled 2023-06-21: qty 1

## 2023-06-21 MED ORDER — CITALOPRAM HYDROBROMIDE 20 MG PO TABS
20.0000 mg | ORAL_TABLET | Freq: Every day | ORAL | 0 refills | Status: DC
  Filled 2023-06-21: qty 30, 30d supply, fill #0

## 2023-06-21 MED ORDER — RIFAXIMIN 550 MG PO TABS
550.0000 mg | ORAL_TABLET | Freq: Two times a day (BID) | ORAL | 0 refills | Status: DC
  Filled 2023-06-21: qty 60, 30d supply, fill #0

## 2023-06-21 MED ORDER — FUROSEMIDE 20 MG PO TABS
20.0000 mg | ORAL_TABLET | Freq: Every day | ORAL | Status: DC
Start: 2023-06-21 — End: 2023-06-21
  Administered 2023-06-21: 20 mg via ORAL
  Filled 2023-06-21: qty 1

## 2023-06-21 NOTE — H&P (View-Only) (Signed)
Consult requested by:  Dr. Renae Gloss  Consult requested for:  Cervical stenosis  Primary Physician:  [name], MD  History of Present Illness: 06/21/2023 [name] is here today with a chief complaint of falls.  He has also had abdominal pain and weight loss.  He was admitted to the hospital several days ago with a diagnosis of E. coli bacteremia.  He has been started on antibiotic treatment which has helped significantly.  He reports that he has been having difficulty with his balance and weakness in his arms and legs for at least a couple of months.  He has had multiple falls.  He reports decline in function of his hands and loss of fine motor control.  He has also had sensory changes in his upper extremity.  He unfortunately has a very difficult living situation.  He has been homeless for 6 years.  Dr. Renae Gloss is working with the transitions of care team to obtain access to housing.    [name] has clear symptoms of cervical myelopathy with imbalance as well as decline in fine motor control.  I have utilized the care everywhere function in epic to review the outside records available from external health systems.  Review of Systems:  A 10 point review of systems is negative, except for the pertinent positives and negatives detailed in the HPI.  Past Medical History: Past Medical History:  Diagnosis Date   Asthma    CHF (congestive heart failure) (HCC)    COPD (chronic obstructive pulmonary disease) (HCC)    COVID-19 03/2020   diagnosed in August 2021   Diabetes mellitus without complication (HCC)    Homelessness    Hypertension    Infestation by bed bug    Migraine    Obesity    Sleep apnea     Past Surgical History: Past Surgical History:  Procedure Laterality Date   CARDIAC CATHETERIZATION     CHOLECYSTECTOMY     EYE SURGERY     INNER EAR SURGERY     NOSE SURGERY     XI ROBOTIC LAPAROSCOPIC ASSISTED APPENDECTOMY N/A 11/05/2022   Procedure: XI  ROBOTIC LAPAROSCOPIC ASSISTED APPENDECTOMY;  Surgeon: Carolan Shiver, MD;  Location: [facility] ORS;  Service: General;  Laterality: N/A;    Allergies: Allergies as of 06/17/2023 - Review Complete 06/17/2023  Allergen Reaction Noted   Bee venom Anaphylaxis 07/01/2022   Codeine Itching 04/28/2022   Lactulose Diarrhea 01/16/2023   Novocain [procaine]  03/28/2018   Sulfa antibiotics Other (See Comments) 12/16/2017    Medications: Current Meds  Medication Sig   doxepin (SINEQUAN) 50 MG capsule Take 1 capsule (50 mg total) by mouth at bedtime.   EPINEPHrine 0.3 mg/0.3 mL IJ SOAJ injection Inject 0.3 mg into the muscle as needed.   furosemide (LASIX) 40 MG tablet Take 1.5 tablets (60 mg total) by mouth 2 (two) times daily for 5 days, THEN 1 tablet (40 mg total) daily.   gabapentin (NEURONTIN) 300 MG capsule Take 300 mg by mouth 2 (two) times daily.   losartan (COZAAR) 50 MG tablet Take 50 mg by mouth daily.   Multiple Vitamin (MULTIVITAMIN WITH MINERALS) TABS tablet Take 1 tablet by mouth daily.   nitroGLYCERIN (NITROSTAT) 0.4 MG SL tablet Place 1 tablet (0.4 mg total) under the tongue every 5 (five) minutes x 3 doses as needed for chest pain.   ondansetron (ZOFRAN) 4 MG tablet Take 1 tablet (4 mg total) by mouth every 6 (six) hours as needed  for nausea.   senna-docusate (SENOKOT-S) 8.6-50 MG tablet Take 1 tablet by mouth at bedtime as needed for mild constipation.   spironolactone (ALDACTONE) 25 MG tablet Take 0.5 tablets (12.5 mg total) by mouth daily.   [EXPIRED] tizanidine (ZANAFLEX) 2 MG capsule Take 1 capsule (2 mg total) by mouth 3 (three) times daily as needed for up to 3 days for muscle spasms.    Social History: Social History   Tobacco Use   Smoking status: Former   Smokeless tobacco: Never  Substance Use Topics   Alcohol use: Never   Drug use: Never    Family Medical History: Family History  Problem Relation Age of Onset   Hypertension Mother    Heart disease Mother     Heart failure Maternal Grandmother     Physical Examination: Vitals:   06/21/23 0318 06/21/23 0714  BP: (!) 146/70 (!) 119/50  Pulse: (!) 53 (!) 52  Resp: 16   Temp: 97.9 F (36.6 C)   SpO2: 100% 100%    General: Patient is in no apparent distress. Attention to examination is appropriate.  Neck:   Supple.  Limited range of motion.  Respiratory: Patient is breathing without any difficulty.   NEUROLOGICAL:     Awake, alert, oriented to person, place, and time.  Speech is clear and fluent.  Cranial Nerves: Pupils equal round and reactive to light.  Facial tone is symmetric.  Facial sensation is symmetric. Shoulder shrug is symmetric. Tongue protrusion is midline.  There is no pronator drift.  Strength: Side Biceps Triceps Deltoid Interossei Grip Wrist Ext. Wrist Flex.  R 4+ 4+ 4 4 4- 4 4  L 4+ 4+ 4 4 4- 4 4   Side Iliopsoas Quads Hamstring PF DF EHL  R 4+ 5 5 5 5 5   L 4+ 5 5 5 5 5    Reflexes are 3+ and symmetric at the biceps, triceps, brachioradialis, patella and achilles.   Hoffman's is present on the left.   Bilateral upper and lower extremity sensation is symmetric but diminished to light touch.    No evidence of dysmetria noted.  Gait is untested.     Medical Decision Making  Imaging: MRI cervical spine 06/20/2023 IMPRESSION: 1. No acute osseous finding. 2. Multilevel disc and facet degeneration throughout the cervical spine resulting in moderate spinal canal stenosis at C3-C4 at C5-C6 and severe spinal canal stenosis with cord compression at C4-C5. No definite cord signal abnormality. 3. Severe bilateral neural foraminal stenosis at C3-C4 through C5-C6. 4. Findings are overall likely not significantly changed compared to the study from 04/28/2022 allowing for motion degradation on that study.     Electronically Signed   By: Lesia Hausen M.D.   On: 06/20/2023 14:45  I have personally reviewed the images and agree with the above  interpretation.  Assessment and Plan: [name] is a pleasant 72 y.o. 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  He has objective weakness on examination and has been suffering from falls.  We reviewed that there is no role for conservative management in this condition.  I have recommended C3-6 anterior cervical discectomy and fusion.  I discussed the planned procedure at length with the patient, including the risks, benefits, alternatives, and indications. The risks discussed include but are not limited to bleeding, infection, need for reoperation, spinal fluid leak, stroke, vision loss, anesthetic complication, coma, paralysis, and even death. We also discussed the possibility of post-operative dysphagia, vocal cord paralysis, and the risk of  adjacent segment disease in the future. I also described in detail that improvement was not guaranteed.  The patient expressed understanding of these risks, and asked that we proceed with surgery. I described the surgery in layman's terms, and gave ample opportunity for questions, which were answered to the best of my ability.  We reviewed that he has had some elevated risk of complication due to his thrombocytopenia and history of cirrhosis.  He does not currently have ascites, but is certainly at heightened risk.  I discussed the planned procedure at length with the patient, including the risks, benefits, alternatives, and indications. The risks discussed include but are not limited to bleeding, infection, need for reoperation, spinal fluid leak, stroke, vision loss, anesthetic complication, coma, paralysis, and even death. We also discussed the possibility of post-operative dysphagia, vocal cord paralysis, and the risk of adjacent segment disease in the future. I also described in detail that improvement was not guaranteed.  The patient expressed understanding of these risks, and asked that we proceed with surgery. I described the surgery in  layman's terms, and gave ample opportunity for questions, which were answered to the best of my ability.  Depending on his disposition, we may plan for surgery during the week of November 4 or need to see him back in clinic for scheduling.   I have communicated my recommendations to the requesting physician and coordinated care to facilitate these recommendations.     Kentaro Alewine K. Myer Haff MD, [facility]

## 2023-06-21 NOTE — Progress Notes (Signed)
Occupational Therapy Treatment Patient Details Name: [name] MRN: [id number] DOB: 29-May-1951 Today's Date: 06/21/2023   History of present illness 72 y.o. male with PMHx of chronic diastolic congestive heart failure, COPD, type 2 diabetes, essential hypertension, cirrhosis, depression, obesity, sleep apnea not on CPAP, homelessness who lives on the [address] who came to the hospital with complaints of dysuria, was found to have E. coli bacteremia, feeling dizzy/lightheaded with falls.   OT comments  Pt is seated at EOB on arrival. Pleasant and agreeable to OT session. He denies pain. Pt planning to DC today to new home and needed assist with LB dressing. He reports use of shoe horn as he reports he used to have one that he used, but needed re-education on how to use it. Pt required SUP and verb cues for proper use of shoe horn, difficulty with L shoe d/t reporting this foot is bigger than the R and shoe much tighter. Educated pt on where to purchase shoe horns for LB dressing. Pt thankful for education and agreeable to short distance mobility in room ~40 feet using RW with SUP. Pt returned to bed with all needs in place and will cont to require skilled acute OT services to maximize his safety and IND to return to PLOF.       If plan is discharge home, recommend the following:  A little help with walking and/or transfers;A little help with bathing/dressing/bathroom;Assistance with cooking/housework;Help with stairs or ramp for entrance   Equipment Recommendations  Other (comment) (shoe horn)    Recommendations for Other Services      Precautions / Restrictions Restrictions Weight Bearing Restrictions: No       Mobility Bed Mobility                    Transfers Overall transfer level: Needs assistance Equipment used: Rolling walker (2 wheels) Transfers: Sit to/from Stand Sit to Stand: Supervision           General transfer comment: STS from EOB to RW     Balance  Overall balance assessment: Needs assistance Sitting-balance support: Feet supported Sitting balance-Leahy Scale: Good     Standing balance support: Bilateral upper extremity supported, During functional activity, Reliant on assistive device for balance Standing balance-Leahy Scale: Good                             ADL either performed or assessed with clinical judgement   ADL                       Lower Body Dressing: Supervision/safety;Sitting/lateral leans;With adaptive equipment Lower Body Dressing Details (indicate cue type and reason): using shoe horn seated at EOB with SUP             Functional mobility during ADLs: Supervision/safety;Rolling walker (2 wheels)      Extremity/Trunk Assessment Upper Extremity Assessment Upper Extremity Assessment: Generalized weakness   Lower Extremity Assessment Lower Extremity Assessment: Generalized weakness        Vision       Perception     Praxis      Cognition Arousal: Alert Behavior During Therapy: WFL for tasks assessed/performed Overall Cognitive Status: Within Functional Limits for tasks assessed  Exercises Other Exercises Other Exercises: Educated pt on where to purchase shoe horns for LB dressing. Re-educated pt on proper use of shoe horn as he reports he used to have one that he used, but needed re-education on how to use it.    Shoulder Instructions       General Comments      Pertinent Vitals/ Pain       Pain Assessment Pain Assessment: No/denies pain  Home Living                                          Prior Functioning/Environment              Frequency  Min 1X/week        Progress Toward Goals  OT Goals(current goals can now be found in the care plan section)  Progress towards OT goals: Progressing toward goals  Acute Rehab OT Goals Patient Stated Goal: return to a home OT Goal  Formulation: With patient Time For Goal Achievement: 07/03/23 Potential to Achieve Goals: Good  Plan      Co-evaluation                 AM-PAC OT "6 Clicks" Daily Activity     Outcome Measure   Help from another person eating meals?: None Help from another person taking care of personal grooming?: None Help from another person toileting, which includes using toliet, bedpan, or urinal?: A Little Help from another person bathing (including washing, rinsing, drying)?: A Little Help from another person to put on and taking off regular upper body clothing?: None Help from another person to put on and taking off regular lower body clothing?: A Little 6 Click Score: 21    End of Session Equipment Utilized During Treatment: Rolling walker (2 wheels)  OT Visit Diagnosis: Unsteadiness on feet (R26.81);Other abnormalities of gait and mobility (R26.89);History of falling (Z91.81)   Activity Tolerance Patient tolerated treatment well   Patient Left in bed;with call bell/phone within reach   Nurse Communication Mobility status        Time: 2595-6387 OT Time Calculation (min): 16 min  Charges: OT General Charges $OT Visit: 1 Visit OT Treatments $Self Care/Home Management : 8-22 mins  Dorsel Flinn, OTR/L  06/21/23, 1:31 PM  Bowe Sidor E Romesha Scherer 06/21/2023, 1:28 PM

## 2023-06-21 NOTE — Telephone Encounter (Signed)
Please see below for information in regards to your upcoming surgery:   Planned surgery: C3-6 anterior cervical discectomy and fusion   Surgery date: 07/01/23 at [facility] (Medical Mall: [address] [place], [place] [address]) - you will find out your arrival time the business day before your surgery.   Pre-op appointment at [facility] Pre-admit Testing: we will call you with a date/time for this. If you are scheduled for an in person appointment, Pre-admit Testing is located on the first floor of the [facility] building, [address] [address] [facility], [address]. Please bring all prescriptions in the original prescription bottles to your appointment. During this appointment, they will advise you which medications you can take the morning of surgery, and which medications you will need to hold for surgery. Labs (such as blood work, EKG) may be done at your pre-op appointment. You are not required to fast for these labs. Should you need to change your pre-op appointment, please call Pre-admit testing at [phone].     Blood thinners:  Aspirin:  OK to stay on aspirin 81mg        Diabetes/weight loss medications: Empagliflozin (Jardiance): hold 3 days prior to surgery     NSAIDS (Non-steroidal anti-inflammatory drugs): because you are having a fusion, please avoid taking any NSAIDS (examples: ibuprofen, motrin, aleve, naproxen, meloxicam, diclofenac) for 3 months after surgery. Celebrex is an exception and is OK to take, if prescribed. Tylenol is not an NSAID.    Common restrictions after surgery: No bending, lifting, or twisting ("BLT"). Avoid lifting objects heavier than 10 pounds for the first 6 weeks after surgery. Where possible, avoid household activities that involve lifting, bending, reaching, pushing, or pulling such as laundry, vacuuming, grocery shopping, and childcare. Try to arrange for help from friends and family for these activities while you  heal. Do not drive while taking prescription pain medication. Weeks 6 through 12 after surgery: avoid lifting more than 25 pounds.    X-rays after surgery: Because you are having a fusion: for appointments after your 2 week follow-up: please arrive at the [facility] outpatient imaging center ([address], [address], [place]) or [facility] one hour prior to your appointment for x-rays. This applies to every appointment after your 2 week follow-up. Failure to do so may result in your appointment being rescheduled.   How to contact us:  If you have any questions/concerns before or after surgery, you can reach [place] at [phone], or you can send a mychart message. We can be reached by phone or mychart 8am-4pm, Monday-Friday.  *Please note: Calls after 4pm are forwarded to a third party answering service. Mychart messages are not routinely monitored during evenings, weekends, and holidays. Please call our office to contact the answering service for urgent concerns during non-business hours.   If you have FMLA/disability paperwork, please drop it off or fax it to [phone], attention Patty.   Appointments/FMLA & disability paperwork: Joycelyn Rua, & Flonnie Hailstone Registered Nurse/Surgery scheduler: Royston Cowper Medical Assistants: Nash Mantis Physician Assistants: Manning Charity, PA-C & Drake Leach, PA-C Surgeons: Venetia Night, MD & Ernestine Mcmurray, MD

## 2023-06-21 NOTE — Discharge Summary (Signed)
Physician Discharge Summary   Patient: [name]: [id number] DOB: [birth year]  Admit date:     06/17/2023  Discharge date: 06/21/23  Discharge Physician: Alford Highland   PCP: Drew Aldo, [name]   Recommendations at discharge:    Follow up with neurosurgery next week Follow up PCP  Discharge Diagnoses: Principal Problem:   E coli bacteremia Active Problems:   Cervical radiculopathy   Liver cirrhosis (HCC)   HTN (hypertension)   Chronic diastolic CHF (congestive heart failure) (HCC)   Hyperlipidemia, unspecified   Diabetes mellitus without complication (HCC)   Obesity (BMI 30-39.9)   Weakness   Major depressive disorder, 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 psychotic features (HCC)   Cervical myelopathy (HCC)   Cervical stenosis of spine    Hospital Course: [name] is a 72 y.o. male with medical history significant of chronic diastolic congestive heart failure, COPD, type 2 diabetes, essential hypertension, obesity, sleep apnea not on CPAP, homelessness who lives on the street who came to the hospital with complaints of dysuria, was found to have E. coli bacteremia.   10/23.  Patient having some falls, patient having abdominal pain and weight loss.  Since urine analysis negative we will get a CAT scan of the abdomen pelvis to see if we have a source of the E. coli bacteremia. 10/24.  Patient having falls at home and weakness bilateral arms and legs.  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  Seen by Neurosurgery and will follow up as outpatient for potential surgery.  Assessment and Plan: * E coli bacteremia Currently on Rocephin.  Case discussed with ID pharmacist and can prescribe Augmentin upon going home and will give a total course of 10 days (11 more doses)  Cervical radiculopathy Case discussed with Dr. Marcell Barlow neurosurgery and he will follow up as outpatient for potential surgical intervention.  Patient is childs Pugh  class B.  Liver cirrhosis (HCC) Pancytopenia, hepatic encephalopathy.  Ammonia level 81.  Continue Xifaxan.  HTN (hypertension) Continue losartan and spironolactone and lasix  Chronic diastolic CHF (congestive heart failure) (HCC) Continue losartan. Lasix, spironolactone and jardiance.  Diabetes mellitus without complication (HCC) Sugars are on the lower side.  Hold metformin.  Sliding scale insulin.  Hemoglobin A1c 5.1. [place] Pepper is more for heart failure than diabetes.  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         Consultants: neurosurgery Procedures performed: none Disposition: Home health Diet recommendation:  Cardiac diet DISCHARGE MEDICATION: Allergies as of 06/21/2023       Reactions   Bee Venom Anaphylaxis   Codeine Itching   Only itching. Recently allergy tested at [facility]   Lactulose Diarrhea   Patient states causes diarrhea   Novocain [procaine]    Sulfa Antibiotics Other (See Comments)        Medication List     STOP taking these medications    ascorbic acid 500 MG tablet Commonly known as: VITAMIN C   aspirin EC 81 MG tablet   doxepin 50 MG capsule Commonly known as: SINEQUAN   EPINEPHrine 0.3 mg/0.3 mL Soaj injection Commonly known as: EPI-PEN   metFORMIN 500 MG tablet Commonly known as: GLUCOPHAGE   nitroGLYCERIN 0.4 MG SL tablet Commonly known as: NITROSTAT   senna-docusate 8.6-50 MG tablet Commonly known as: Senokot-S   tizanidine 2 MG capsule Commonly known as: ZANAFLEX  TAKE these medications    amoxicillin-clavulanate 875-125 MG tablet Commonly known as: AUGMENTIN Take 1 tablet by mouth every 12 (twelve) hours for 11 doses.   atorvastatin 40 MG tablet Commonly known as: Lipitor Take 1 tablet (40 mg total) by mouth daily.   citalopram 20 MG tablet Commonly  known as: CeleXA Take 1 tablet (20 mg total) by mouth daily.   empagliflozin 10 MG Tabs tablet Commonly known as: Jardiance Take 1 tablet (10 mg total) by mouth daily before breakfast.   furosemide 20 MG tablet Commonly known as: LASIX Take 1 tablet (20 mg total) by mouth daily. What changed:  medication strength See the new instructions.   gabapentin 300 MG capsule Commonly known as: NEURONTIN Take 300 mg by mouth 2 (two) times daily.   losartan 50 MG tablet Commonly known as: COZAAR Take 1 tablet (50 mg total) by mouth daily.   multivitamin with minerals Tabs tablet Take 1 tablet by mouth daily.   ondansetron 4 MG tablet Commonly known as: ZOFRAN Take 1 tablet (4 mg total) by mouth every 6 (six) hours as needed for nausea.   oxyCODONE-acetaminophen 5-325 MG tablet Commonly known as: PERCOCET/ROXICET Take 1 tablet by mouth every 6 (six) hours as needed for severe pain (pain score 7-10).   pantoprazole 40 MG tablet Commonly known as: Protonix Take 1 tablet (40 mg total) by mouth daily.   QUEtiapine 50 MG tablet Commonly known as: SEROQUEL Take 1 tablet (50 mg total) by mouth at bedtime.   rifaximin 550 MG Tabs tablet Commonly known as: XIFAXAN Take 1 tablet (550 mg total) by mouth 2 (two) times daily.   spironolactone 25 MG tablet Commonly known as: ALDACTONE Take 0.5 tablets (12.5 mg total) by mouth daily.               Durable Medical Equipment  (From admission, onward)           Start     Ordered   06/20/23 1111  For home use only DME Walker rolling  Once       Question Answer Comment  Walker: With 5 Inch Wheels   Patient needs a walker to treat with the following condition Weakness      06/20/23 1111            Follow-up Information     Drew Aldo, [name]. Go on 07/01/2023.   Specialty: Family Medicine Why: Go at 10:00am. Contact information: [address] [place] [address] [phone]         Venetia Night, [name]. Go on 07/02/2023.   Specialty: Neurosurgery Why: Go at 9:00am. Contact information: [address] [place] [address] [phone]                Discharge Exam: Physical Exam HENT:     Head: Normocephalic.     Mouth/Throat:     Pharynx: No oropharyngeal exudate.  Eyes:     General: Lids are normal.     Conjunctiva/sclera: Conjunctivae normal.  Cardiovascular:     Rate and Rhythm: Normal rate and regular rhythm.     Heart sounds: Normal heart sounds, S1 normal and S2 normal.  Pulmonary:     Breath sounds: No decreased breath sounds, wheezing, rhonchi or rales.  Abdominal:     Palpations: Abdomen is soft.     Tenderness: There is abdominal tenderness in the left lower quadrant.  Musculoskeletal:     Right lower leg: No swelling.  Left lower leg: No swelling.  Skin:    General: Skin is warm.     Findings: No rash.  Neurological:     Mental Status: He is alert and oriented to person, place, and time.      Condition at discharge: stable  The results of significant diagnostics from this hospitalization (including imaging, microbiology, ancillary and laboratory) are listed below for reference.   Imaging Studies: MR Lumbar Spine W Wo Contrast  Result Date: 06/20/2023 CLINICAL DATA:  Spinal stenosis, multiple falls.  Bacteremia. EXAM: MRI LUMBAR SPINE WITHOUT AND WITH CONTRAST TECHNIQUE: Multiplanar and multiecho pulse sequences of the lumbar spine were obtained without and with intravenous contrast. CONTRAST:  10mL GADAVIST GADOBUTROL 1 MMOL/ML IV SOLN COMPARISON:  Lumbar spine MRI 08/31/2022 FINDINGS: Segmentation: Standard; the lowest formed disc space is designated L5-S1. Alignment:  Normal. Vertebrae: Vertebral body heights are preserved. Background marrow signal is normal. There is no suspicious marrow signal abnormality or marrow edema. There is no abnormal marrow enhancement. Conus medullaris and cauda equina: Conus extends  to the L1-L2 level. Conus and cauda equina appear normal. Paraspinal and other soft tissues: There is mild perifacetal soft tissue edema on the left at L5-S1. The paraspinal soft tissues are otherwise unremarkable. Disc levels: There is mild disc desiccation and narrowing at L2-L3 through L5-S1, similar to the prior study. T12-L1: No significant spinal canal or neural foraminal stenosis. L1-L2: No significant spinal canal or neural foraminal stenosis. L2-L3: There is a minimal disc bulge without significant spinal canal or neural foraminal stenosis, unchanged. L3-L4: There is a diffuse disc bulge and mild facet arthropathy resulting in bilateral subarticular zone narrowing and mild left worse than right neural foraminal stenosis unchanged. L4-L5: There is a disc bulge with a central annular fissure and moderate bilateral facet arthropathy resulting in bilateral subarticular zone narrowing and mild bilateral neural foraminal stenosis, unchanged. L5-S1: There is a small central protrusion and annular fissure and moderate left worse than right facet arthropathy without significant spinal canal or neural foraminal stenosis, unchanged. IMPRESSION: 1. No acute finding or significant change since the study from 08/31/2022. 2. Moderate facet arthropathy at L4-L5 and L5-S1 with mild perifacetal soft tissue edema on the left at L5-S1 which could reflect a source of pain. 3. Mild bilateral subarticular zone narrowing and neural foraminal stenosis at L3-L4 and L4-L5. No high-grade spinal canal or neural foraminal stenosis at any level. Electronically Signed   By: Lesia Hausen M.D.   On: 06/20/2023 14:51   MR CERVICAL SPINE W WO CONTRAST  Result Date: 06/20/2023 CLINICAL DATA:  Spinal stenosis, multiple falls EXAM: MRI CERVICAL SPINE WITHOUT AND WITH CONTRAST TECHNIQUE: Multiplanar and multiecho pulse sequences of the cervical spine, to include the craniocervical junction and cervicothoracic junction, were obtained without  and with intravenous contrast. CONTRAST:  10mL GADAVIST GADOBUTROL 1 MMOL/ML IV SOLN COMPARISON:  Cervical spine CT 06/17/2023, cervical spine MRI 04/28/2022 FINDINGS: Alignment: Normal. Vertebrae: Vertebral body heights are preserved. Background marrow signal is normal. Ankylosis of the C6 and C7 vertebral bodies is unchanged. An intraosseous hemangioma in the T3 vertebral body is unchanged. There is no suspicious marrow signal abnormality or marrow edema. There is no abnormal marrow enhancement. Cord: There is no definite cord signal abnormality. Posterior Fossa, vertebral arteries, paraspinal tissues: The imaged posterior fossa is unremarkable. The vertebral artery flow voids are normal. The paraspinal soft tissues are unremarkable. Disc levels: There is moderate disc desiccation and narrowing at C3-C4 through C5-C6, similar to the prior study.  C2-C3: Mild right uncovertebral ridging without significant spinal canal or neural foraminal stenosis, unchanged. C3-C4: There is a shallow posterior disc osteophyte complex, bilateral uncovertebral ridging, and mild bilateral facet arthropathy with ligamentum flavum thickening resulting in moderate spinal canal stenosis and severe bilateral neural foraminal stenosis, not significantly changed. C4-C5: There is a posterior disc osteophyte complex, bilateral uncovertebral ridging, and bilateral facet arthropathy with ligamentum flavum thickening resulting in severe spinal canal stenosis with compression of the left aspect of the cord, and severe bilateral neural foraminal stenosis, not significantly changed. C5-C6: There is bilateral uncovertebral ridging and facet arthropathy with ligamentum flavum thickening resulting in moderate spinal canal stenosis and severe left worse than right neural foraminal stenosis, likely not significantly changed. C6-C7: Chronic fusion across the disc space without significant spinal canal or neural foraminal stenosis. C7-T1: Bilateral facet  arthropathy without significant spinal canal or neural foraminal stenosis. IMPRESSION: 1. No acute osseous finding. 2. Multilevel disc and facet degeneration throughout the cervical spine resulting in moderate spinal canal stenosis at C3-C4 at C5-C6 and severe spinal canal stenosis with cord compression at C4-C5. No definite cord signal abnormality. 3. Severe bilateral neural foraminal stenosis at C3-C4 through C5-C6. 4. Findings are overall likely not significantly changed compared to the study from 04/28/2022 allowing for motion degradation on that study. Electronically Signed   By: Lesia Hausen M.D.   On: 06/20/2023 14:45   CT ABDOMEN PELVIS W CONTRAST  Result Date: 06/19/2023 CLINICAL DATA:  Acute abdominal pain and E coli bacteremia, initial encounter EXAM: CT ABDOMEN AND PELVIS WITH CONTRAST TECHNIQUE: Multidetector CT imaging of the abdomen and pelvis was performed using the standard protocol following bolus administration of intravenous contrast. RADIATION DOSE REDUCTION: This exam was performed according to the departmental dose-optimization program which includes automated exposure control, adjustment of the mA and/or kV according to patient size and/or use of iterative reconstruction technique. CONTRAST:  OMNIPAQUE IOHEXOL 300 MG/ML  SOLN COMPARISON:  06/04/2023 FINDINGS: Lower chest: Mild atelectatic changes are noted bilaterally. Hepatobiliary: Gallbladder has been surgically removed. The common bile duct is again chronically dilated to 2.8 cm stable from the prior exam. The liver is nodular and shrunken in appearance consistent with underlying cirrhosis. No focal mass is seen. Pancreas: Unremarkable. No pancreatic ductal dilatation or surrounding inflammatory changes. Spleen: Normal in size without focal abnormality. Adrenals/Urinary Tract: Adrenal glands are within normal limits. Kidneys demonstrate a punctate nonobstructing stone in the upper pole of the right kidney. Larger nonobstructing  stones are noted in the lower pole of the right kidney measuring up to 7 mm. Left kidney shows no calculi. No obstructive changes are seen. The ureters are within normal limits. The bladder is partially distended. Stomach/Bowel: Scattered fecal material is noted throughout the colon. No obstructive changes are noted. No inflammatory changes are seen. The appendix is not well visualized and may have been surgically removed. No inflammatory changes are seen. Stomach and small bowel are within normal limits. Vascular/Lymphatic: Aortic atherosclerosis. No enlarged abdominal or pelvic lymph nodes. Recanalization of the umbilical vein is noted with large anterior abdominal wall collaterals extending to the right common femoral vein. Reproductive: Prostate is unremarkable. Other: Mild left anterior abdominal wall hernia is noted with a single loop of small bowel within. No incarceration is noted. No abdominopelvic ascites. Musculoskeletal: Degenerative changes of lumbar spine are noted. Mild changes of anasarca are noted. IMPRESSION: Cirrhotic change of the liver with evidence of portal hypertension . Stable hernia in the left anterior abdominal wall. Nonobstructing  right renal calculi as described. Chronic dilatation of the common bile duct stable from the prior exam. Electronically Signed   By: Drew Clever M.D.   On: 06/19/2023 18:31   CT Cervical Spine Wo Contrast  Result Date: 06/17/2023 CLINICAL DATA:  fall EXAM: CT CERVICAL SPINE WITHOUT CONTRAST TECHNIQUE: Multidetector CT imaging of the cervical spine was performed without intravenous contrast. Multiplanar CT image reconstructions were also generated. RADIATION DOSE REDUCTION: This exam was performed according to the departmental dose-optimization program which includes automated exposure control, adjustment of the mA and/or kV according to patient size and/or use of iterative reconstruction technique. COMPARISON:  Cervical spine CT 04/12/2023 FINDINGS:  Alignment: Normal. Skull base and vertebrae: No acute fracture. Vertebral body heights are maintained. The dens and skull base are intact. Soft tissues and spinal canal: No prevertebral fluid or swelling. No visible canal hematoma. Disc levels: Diffuse degenerative disc disease, without significant interval change. Upper chest: Nonacute. Other: None. IMPRESSION: 1. No acute fracture or subluxation of the cervical spine. 2. Diffuse degenerative disc disease, without significant interval change. Electronically Signed   By: Narda Rutherford M.D.   On: 06/17/2023 16:15   CT HEAD WO CONTRAST ( )  Result Date: 06/17/2023 CLINICAL DATA:  fall EXAM: CT HEAD WITHOUT CONTRAST TECHNIQUE: Contiguous axial images were obtained from the base of the skull through the vertex without intravenous contrast. RADIATION DOSE REDUCTION: This exam was performed according to the departmental dose-optimization program which includes automated exposure control, adjustment of the mA and/or kV according to patient size and/or use of iterative reconstruction technique. COMPARISON:  Head CT 04/13/2023 FINDINGS: Brain: No intracranial hemorrhage, mass effect, or midline shift. Stable degree of atrophy and chronic small vessel ischemia. No hydrocephalus. The basilar cisterns are patent. No evidence of territorial infarct or acute ischemia. No extra-axial or intracranial fluid collection. Vascular: Atherosclerosis of skullbase vasculature without hyperdense vessel or abnormal calcification. Skull: No fracture or focal lesion. Sinuses/Orbits: No acute finding. Other: None. IMPRESSION: 1. No acute intracranial abnormality. No skull fracture. 2. Stable atrophy and chronic small vessel ischemia. Electronically Signed   By: Narda Rutherford M.D.   On: 06/17/2023 16:12   DG Chest 2 View  Result Date: 06/16/2023 CLINICAL DATA:  Weakness. EXAM: CHEST - 2 VIEW COMPARISON:  06/06/2023. FINDINGS: Low lung volume. Bilateral lung fields are clear.  Bilateral costophrenic angles are clear. Normal cardio-mediastinal silhouette. No acute osseous abnormalities. The soft tissues are within normal limits. IMPRESSION: No active cardiopulmonary disease. Electronically Signed   By: Jules Schick M.D.   On: 06/16/2023 13:48   DG Chest 2 View  Result Date: 06/06/2023 CLINICAL DATA:  chest pain EXAM: CHEST - 2 VIEW COMPARISON:  Chest x-ray 04/12/2023 FINDINGS: The heart and mediastinal contours are unchanged. No focal consolidation. No pulmonary edema. No pleural effusion. No pneumothorax. No acute osseous abnormality.  Right upper quadrant surgical clips. IMPRESSION: No active cardiopulmonary disease. Electronically Signed   By: Tish Frederickson M.D.   On: 06/06/2023 19:55   CT ABDOMEN PELVIS WO CONTRAST  Result Date: 06/04/2023 CLINICAL DATA:  Left upper quadrant pain EXAM: CT ABDOMEN AND PELVIS WITHOUT CONTRAST TECHNIQUE: Multidetector CT imaging of the abdomen and pelvis was performed following the standard protocol without IV contrast. RADIATION DOSE REDUCTION: This exam was performed according to the departmental dose-optimization program which includes automated exposure control, adjustment of the mA and/or kV according to patient size and/or use of iterative reconstruction technique. COMPARISON:  CT 04/02/2023, 01/08/2023, 02/17/2020 FINDINGS: Lower chest: Lung bases  demonstrate no acute airspace disease. Tiny pericardial effusion. Hepatobiliary: Liver cirrhosis. Cholecystectomy. Chronically enlarged common bile duct, measures up to 2.8 cm today, waxing and waning diameter compared to multiple prior exams. Pancreas: Unremarkable. No pancreatic ductal dilatation or surrounding inflammatory changes. Spleen: Normal in size without focal abnormality. Adrenals/Urinary Tract: Adrenal glands are within normal limits. Lobulated renal contours. No hydronephrosis. Multiple right kidney stones measuring up to 7 mm. The bladder is unremarkable Stomach/Bowel: Stomach  nonenlarged. No dilated small bowel. No acute bowel wall thickening. Nonvisualized appendix Vascular/Lymphatic: Mild aortic atherosclerosis. No aneurysm. Recanalized umbilical vein with large anterior abdominal collateral vessels. No suspicious lymph nodes Reproductive: Negative for mass Other: Negative for pelvic effusion or free air. Generalized subcutaneous edema. Musculoskeletal: No acute or suspicious osseous abnormality IMPRESSION: 1. No CT evidence for acute intra-abdominal or pelvic abnormality. 2. Liver cirrhosis with portal hypertension. 3. Chronically enlarged common bile duct, measures up to 2.8 cm today, fluctuating diameter compared to multiple prior exams. 4. Nonobstructing right kidney stones. 5. Aortic atherosclerosis. Aortic Atherosclerosis (ICD10-I70.0). Electronically Signed   By: Drew Pang M.D.   On: 06/04/2023 21:10    Microbiology: Results for orders placed or performed during the hospital encounter of 06/17/23  Culture, blood (routine x 2)     Status: None (Preliminary result)   Collection Time: 06/17/23  5:45 PM   Specimen: BLOOD  Result Value Ref Range Status   Specimen Description BLOOD BLOOD RIGHT ARM  Final   Special Requests   Final    BOTTLES DRAWN AEROBIC AND ANAEROBIC Blood Culture adequate volume   Culture   Final    NO GROWTH 4 DAYS Performed at [facility], [address] [place], [place] [address]    Report Status PENDING  Incomplete  Culture, blood (routine x 2)     Status: None (Preliminary result)   Collection Time: 06/17/23  5:46 PM   Specimen: BLOOD  Result Value Ref Range Status   Specimen Description BLOOD BLOOD LEFT ARM  Final   Special Requests   Final    BOTTLES DRAWN AEROBIC AND ANAEROBIC Blood Culture adequate volume   Culture   Final    NO GROWTH 4 DAYS Performed at [facility], [address] [place], [place] [address]    Report Status PENDING  Incomplete    Labs: CBC: Recent Labs  Lab 06/16/23 1230  06/17/23 1744 06/18/23 0527 06/19/23 0458 06/20/23 0758 06/21/23 0456  WBC 3.5* 3.4* 2.5* 2.2* 2.0* 2.0*  NEUTROABS 2.6 2.0  --   --   --   --   HGB 12.6* 12.2* 9.8* 10.2* 11.2* 11.6*  HCT 38.7* 36.8* 28.5* 30.3* 33.7* 35.0*  MCV 94.2 92.5 90.8 91.8 92.6 92.1  PLT 90* 101* 73* 74* 76* 75*   Basic Metabolic Panel: Recent Labs  Lab 06/16/23 1230 06/17/23 1744 06/18/23 0527 06/20/23 0758 06/21/23 0456  NA 140 141 140 140 136  K 4.0 4.0 3.9 4.2 4.2  CL 108 109 111 110 105  CO2 24 24 24 25 27   GLUCOSE 121* 64* 82 64* 79  BUN 16 20 20 16 15   CREATININE 0.91 0.97 0.94 1.00 0.98  CALCIUM 9.6 9.6 8.9 8.9 8.8*  MG  --   --  1.8  --   --   PHOS  --   --  3.2  --   --    Liver Function Tests: Recent Labs  Lab 06/17/23 1744 06/21/23 0456  AST 63* 40  ALT 41 30  ALKPHOS 80 79  BILITOT 2.3* 1.0  PROT 7.2 6.2*  ALBUMIN 3.0* 2.4*   CBG: Recent Labs  Lab 06/20/23 0819 06/20/23 1158 06/20/23 1536 06/20/23 2036 06/21/23 0716  GLUCAP 104* 148* 114* 101* 105*    Discharge time spent: greater than 30 minutes.  Signed: Alford Highland, [name] [facility] 06/21/2023

## 2023-06-21 NOTE — Plan of Care (Signed)
Adequate for discharge.

## 2023-06-21 NOTE — TOC Transition Note (Signed)
Transition of Care [facility]) - CM/SW Discharge Note   Patient Details  Name: [name] MRN: [id number] Date of Birth: [birth year]  Transition of Care [facility]) CM/SW Contact:  Chapman Fitch, RN Phone Number: 06/21/2023, 11:49 AM   Clinical Narrative:      At discharge patient states "I'm on the sex offender registry list, you are going to have to call them to make sure the place that I am going to is ok"  Spoke with Ladona Ridgel at [employer] office who states that [address] is too close to a day care  Completed 3 way call with Doristine Mango  He is able to take patient at [facility] at [address]  Ladona Ridgel at [employer] office confirms this is an acceptable address.   RW was delivered to room by adapt  Patient in agreement to home health services.  States he does not have a preference of home health agency.  CMS Medicare.gov Compare Post Acute Care list reviewed with patient.  Referral made to [facility] with [facility].   DC summary and Fl2 faxed to clement  Doristine Mango  is to pick patient up, and will take him by the sheriff office to complete a change of address paper work        Patient Goals and CMS Choice      Discharge Placement                         Discharge Plan and Services Additional resources added to the After Visit Summary for                                       Social Determinants of Health (SDOH) Interventions SDOH Screenings   Food Insecurity: Food Insecurity Present (06/18/2023)  Housing: High Risk (06/18/2023)  Transportation Needs: Unmet Transportation Needs (06/18/2023)  Utilities: At Risk (06/18/2023)  Alcohol Screen: Low Risk  (09/24/2022)  Depression (PHQ2-9): High Risk (04/26/2022)  Financial Resource Strain: High Risk (04/27/2022)  Physical Activity: Sufficiently Active (04/16/2018)  Social Connections: Moderately Isolated (04/30/2018)  Stress: Stress Concern Present (04/16/2018)  Tobacco Use: Medium Risk  (06/18/2023)     Readmission Risk Interventions    06/18/2023   12:36 PM 11/07/2022    1:45 PM  Readmission Risk Prevention Plan  Transportation Screening Complete Complete  Medication Review Oceanographer) Complete   PCP or Specialist appointment within 3-5 days of discharge Complete   HRI or Home Care Consult Complete   SW Recovery Care/Counseling Consult Not Complete Complete  Palliative Care Screening Not Complete Not Applicable  Skilled Nursing Facility Complete Not Applicable

## 2023-06-21 NOTE — Progress Notes (Signed)
IV removed, discharge paperwork discussed. Meds delivered to room, DME delivered. Patient's [place] [facility] [place], patient being wheeled to the exit by volunteers in stable condition.  [name]

## 2023-06-21 NOTE — Consult Note (Signed)
Consult requested by:  Dr. Renae Gloss  Consult requested for:  Cervical stenosis  Primary Physician:  [name], MD  History of Present Illness: 06/21/2023 [name] is here today with a chief complaint of falls.  He has also had abdominal pain and weight loss.  He was admitted to the hospital several days ago with a diagnosis of E. coli bacteremia.  He has been started on antibiotic treatment which has helped significantly.  He reports that he has been having difficulty with his balance and weakness in his arms and legs for at least a couple of months.  He has had multiple falls.  He reports decline in function of his hands and loss of fine motor control.  He has also had sensory changes in his upper extremity.  He unfortunately has a very difficult living situation.  He has been homeless for 6 years.  Dr. Renae Gloss is working with the transitions of care team to obtain access to housing.    [name] has clear symptoms of cervical myelopathy with imbalance as well as decline in fine motor control.  I have utilized the care everywhere function in epic to review the outside records available from external health systems.  Review of Systems:  A 10 point review of systems is negative, except for the pertinent positives and negatives detailed in the HPI.  Past Medical History: Past Medical History:  Diagnosis Date   Asthma    CHF (congestive heart failure) (HCC)    COPD (chronic obstructive pulmonary disease) (HCC)    COVID-19 03/2020   diagnosed in August 2021   Diabetes mellitus without complication (HCC)    Homelessness    Hypertension    Infestation by bed bug    Migraine    Obesity    Sleep apnea     Past Surgical History: Past Surgical History:  Procedure Laterality Date   CARDIAC CATHETERIZATION     CHOLECYSTECTOMY     EYE SURGERY     INNER EAR SURGERY     NOSE SURGERY     XI ROBOTIC LAPAROSCOPIC ASSISTED APPENDECTOMY N/A 11/05/2022   Procedure: XI  ROBOTIC LAPAROSCOPIC ASSISTED APPENDECTOMY;  Surgeon: Carolan Shiver, MD;  Location: [facility] ORS;  Service: General;  Laterality: N/A;    Allergies: Allergies as of 06/17/2023 - Review Complete 06/17/2023  Allergen Reaction Noted   Bee venom Anaphylaxis 07/01/2022   Codeine Itching 04/28/2022   Lactulose Diarrhea 01/16/2023   Novocain [procaine]  03/28/2018   Sulfa antibiotics Other (See Comments) 12/16/2017    Medications: Current Meds  Medication Sig   doxepin (SINEQUAN) 50 MG capsule Take 1 capsule (50 mg total) by mouth at bedtime.   EPINEPHrine 0.3 mg/0.3 mL IJ SOAJ injection Inject 0.3 mg into the muscle as needed.   furosemide (LASIX) 40 MG tablet Take 1.5 tablets (60 mg total) by mouth 2 (two) times daily for 5 days, THEN 1 tablet (40 mg total) daily.   gabapentin (NEURONTIN) 300 MG capsule Take 300 mg by mouth 2 (two) times daily.   losartan (COZAAR) 50 MG tablet Take 50 mg by mouth daily.   Multiple Vitamin (MULTIVITAMIN WITH MINERALS) TABS tablet Take 1 tablet by mouth daily.   nitroGLYCERIN (NITROSTAT) 0.4 MG SL tablet Place 1 tablet (0.4 mg total) under the tongue every 5 (five) minutes x 3 doses as needed for chest pain.   ondansetron (ZOFRAN) 4 MG tablet Take 1 tablet (4 mg total) by mouth every 6 (six) hours as needed  for nausea.   senna-docusate (SENOKOT-S) 8.6-50 MG tablet Take 1 tablet by mouth at bedtime as needed for mild constipation.   spironolactone (ALDACTONE) 25 MG tablet Take 0.5 tablets (12.5 mg total) by mouth daily.   [EXPIRED] tizanidine (ZANAFLEX) 2 MG capsule Take 1 capsule (2 mg total) by mouth 3 (three) times daily as needed for up to 3 days for muscle spasms.    Social History: Social History   Tobacco Use   Smoking status: Former   Smokeless tobacco: Never  Substance Use Topics   Alcohol use: Never   Drug use: Never    Family Medical History: Family History  Problem Relation Age of Onset   Hypertension Mother    Heart disease Mother     Heart failure Maternal Grandmother     Physical Examination: Vitals:   06/21/23 0318 06/21/23 0714  BP: (!) 146/70 (!) 119/50  Pulse: (!) 53 (!) 52  Resp: 16   Temp: 97.9 F (36.6 C)   SpO2: 100% 100%    General: Patient is in no apparent distress. Attention to examination is appropriate.  Neck:   Supple.  Limited range of motion.  Respiratory: Patient is breathing without any difficulty.   NEUROLOGICAL:     Awake, alert, oriented to person, place, and time.  Speech is clear and fluent.  Cranial Nerves: Pupils equal round and reactive to light.  Facial tone is symmetric.  Facial sensation is symmetric. Shoulder shrug is symmetric. Tongue protrusion is midline.  There is no pronator drift.  Strength: Side Biceps Triceps Deltoid Interossei Grip Wrist Ext. Wrist Flex.  R 4+ 4+ 4 4 4- 4 4  L 4+ 4+ 4 4 4- 4 4   Side Iliopsoas Quads Hamstring PF DF EHL  R 4+ 5 5 5 5 5   L 4+ 5 5 5 5 5    Reflexes are 3+ and symmetric at the biceps, triceps, brachioradialis, patella and achilles.   Hoffman's is present on the left.   Bilateral upper and lower extremity sensation is symmetric but diminished to light touch.    No evidence of dysmetria noted.  Gait is untested.     Medical Decision Making  Imaging: MRI cervical spine 06/20/2023 IMPRESSION: 1. No acute osseous finding. 2. Multilevel disc and facet degeneration throughout the cervical spine resulting in moderate spinal canal stenosis at C3-C4 at C5-C6 and severe spinal canal stenosis with cord compression at C4-C5. No definite cord signal abnormality. 3. Severe bilateral neural foraminal stenosis at C3-C4 through C5-C6. 4. Findings are overall likely not significantly changed compared to the study from 04/28/2022 allowing for motion degradation on that study.     Electronically Signed   By: Lesia Hausen M.D.   On: 06/20/2023 14:45  I have personally reviewed the images and agree with the above  interpretation.  Assessment and Plan: [name] is a pleasant 72 y.o. 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  He has objective weakness on examination and has been suffering from falls.  We reviewed that there is no role for conservative management in this condition.  I have recommended C3-6 anterior cervical discectomy and fusion.  I discussed the planned procedure at length with the patient, including the risks, benefits, alternatives, and indications. The risks discussed include but are not limited to bleeding, infection, need for reoperation, spinal fluid leak, stroke, vision loss, anesthetic complication, coma, paralysis, and even death. We also discussed the possibility of post-operative dysphagia, vocal cord paralysis, and the risk of  adjacent segment disease in the future. I also described in detail that improvement was not guaranteed.  The patient expressed understanding of these risks, and asked that we proceed with surgery. I described the surgery in layman's terms, and gave ample opportunity for questions, which were answered to the best of my ability.  We reviewed that he has had some elevated risk of complication due to his thrombocytopenia and history of cirrhosis.  He does not currently have ascites, but is certainly at heightened risk.  I discussed the planned procedure at length with the patient, including the risks, benefits, alternatives, and indications. The risks discussed include but are not limited to bleeding, infection, need for reoperation, spinal fluid leak, stroke, vision loss, anesthetic complication, coma, paralysis, and even death. We also discussed the possibility of post-operative dysphagia, vocal cord paralysis, and the risk of adjacent segment disease in the future. I also described in detail that improvement was not guaranteed.  The patient expressed understanding of these risks, and asked that we proceed with surgery. I described the surgery in  layman's terms, and gave ample opportunity for questions, which were answered to the best of my ability.  Depending on his disposition, we may plan for surgery during the week of November 4 or need to see him back in clinic for scheduling.   I have communicated my recommendations to the requesting physician and coordinated care to facilitate these recommendations.     Kentaro Alewine K. Myer Haff MD, [facility]

## 2023-06-22 LAB — CULTURE, BLOOD (ROUTINE X 2)
Culture: NO GROWTH
Culture: NO GROWTH
Special Requests: ADEQUATE
Special Requests: ADEQUATE

## 2023-06-24 ENCOUNTER — Telehealth: Payer: Self-pay | Admitting: *Deleted

## 2023-06-24 ENCOUNTER — Encounter: Payer: Self-pay | Admitting: Urgent Care

## 2023-06-24 NOTE — Telephone Encounter (Signed)
Left message to return call 

## 2023-06-24 NOTE — Telephone Encounter (Signed)
I received a secure chat message from Sharlot Gowda, RN: Elvina Sidle. I saw you were trying to reach him. He says he lost his cell phone. He is at [facility] [phone] if you call and ask to speak to him. Julieanne Cotton, the caregiver/med tech answered the phone. After I spoke w/ the patient and [place], she gave me the # to Mul[place]a [place], her director [phone] that coordinates things for him.   I thanked Charity fundraiser for her help. I will reach out to [place]ley, Interior and spatial designer [phone].   I s/w Clements and he has scheduled tele pre op appt for the pt 06/26/23. Meds he said will need to be reviewed at the time of the visit as he states the facility is still in process of putting a list together for the pt.   Consent is given.

## 2023-06-24 NOTE — Telephone Encounter (Signed)
Name: [name]  DOB: 12-30-1950  MRN: [id number]  Primary Cardiologist: None   Preoperative team, please contact this patient and set up a phone call appointment for further preoperative risk assessment. Please obtain consent and complete medication review. Thank you for your help.  I confirm that guidance regarding antiplatelet and oral anticoagulation therapy has been completed and, if necessary, noted below.  None  I also confirmed the patient resides in the state of [place]. As per [facility] Medical Board telemedicine laws, the patient must reside in the state in which the provider is licensed.   Napoleon Form, Leodis Rains, NP 06/24/2023, 9:55 AM [facility]

## 2023-06-24 NOTE — Telephone Encounter (Signed)
Left message to call back to set up tele pre op appt. Ok per Robin Searing, NP to add pt on tomorrow due to procedure date.

## 2023-06-24 NOTE — Telephone Encounter (Signed)
I spoke with [facility] regarding instructions and post op appts.

## 2023-06-24 NOTE — Telephone Encounter (Signed)
-----   Message from Verlee Monte sent at 06/24/2023  9:22 AM EDT ----- Regarding: Request for pre-operative cardiac clearance Request for pre-operative cardiac clearance:  1. What type of surgery is being performed?  ANTERIOR CERVICAL DISCECTOMY AND FUSION (FORGE)  2. When is this surgery scheduled?  07/01/2023  3. Type of clearance being requested (medical, pharmacy, both)? MEDICAL   4. Are there any medications that need to be held prior to surgery? NONE  5. Practice name and name of physician performing surgery?  Performing surgeon: Dr. Venetia Night, MD Requesting clearance: Quentin Mulling, FNP-C    6. Anesthesia type (none, local, MAC, general)? GENERAL  7. What is the office phone and fax number?   Phone: [phone] Fax: [phone]  ATTENTION: Unable to create telephone message as per your standard workflow. Directed by [facility] providers to send requests for cardiac clearance to this pool for appropriate distribution to provider covering pre-operative clearances.   Quentin Mulling, MSN, APRN, FNP-C, CEN [facility]  Peri-operative Services Nurse Practitioner Phone: [phone] 06/24/23 9:22 AM

## 2023-06-24 NOTE — Telephone Encounter (Signed)
[name] is currently at [facility] [phone], [address], [place], [place] [address]. I spoke with Drew Yap via [phone] - he reports he lost his cell phone. He then gave the phone to [place], one of the caregivers at the home. I reviewed the instructions in my message below with both of them. [name] requested that I speak with [name], her director at [phone].  I attempted to reach [name], but he did not answer and his voicemail was full.  I have also placed a copy of the instructions in the mail.

## 2023-06-24 NOTE — Telephone Encounter (Signed)
I will also fax notes to [facility] as FYI. Fax # [phone]

## 2023-06-26 ENCOUNTER — Ambulatory Visit: Payer: 59 | Attending: Nurse Practitioner

## 2023-06-26 DIAGNOSIS — Z01810 Encounter for preprocedural cardiovascular examination: Secondary | ICD-10-CM

## 2023-06-26 NOTE — Progress Notes (Signed)
Virtual Visit via Telephone Note   Because of [name]'s co-morbid illnesses, he is at least at moderate risk for complications without adequate follow up.  This format is felt to be most appropriate for this patient at this time.  The patient did not have access to video technology/had technical difficulties with video requiring transitioning to audio format only (telephone).  All issues noted in this document were discussed and addressed.  No physical exam could be performed with this format.  Please refer to the patient's chart for his consent to telehealth for [facility].  Evaluation Performed:  Preoperative cardiovascular risk assessment _____________   Date:  06/26/2023   Patient ID:  Drew, [name] [birth year], MRN [id number] Patient Location:  Home Provider location:   Office  Primary Care Provider:  [name], MD Primary Cardiologist:  None  Chief Complaint / Patient Profile   72 y.o. y/o male with a h/o CHF, COPD, DM2, HTN, migraine disorder, obesity, lymphedema, OSA not compliant with CPAP  who is pending anterior cervical discectomy and fusion and presents today for telephonic preoperative cardiovascular risk assessment.  History of Present Illness    [name] is a 72 y.o. male who presents via audio/video conferencing for a telehealth visit today.  Pt was last seen in cardiology clinic on 01/17/23 by Dr. Gasper Lloyd.  At that time [name] was doing well with no new complaints or concerns..  The patient is now pending procedure as outlined above. Since his last visit, he has been doing well and continues to not have any new complaints or concerns at this time.  He denies chest pain, shortness of breath, lower extremity edema, fatigue, palpitations, melena, hematuria, hemoptysis, diaphoresis, weakness, presyncope, syncope, orthopnea, and PND.   None  Past Medical History    Past Medical History:  Diagnosis Date   (HFpEF) heart failure  with preserved ejection fraction (HCC)    a.) TTE 03/20/2018: EF 60-65%, no RWMAs, mild LAE, mild-mod MR, PASP 42; b.) TTE 03/31/2021: EF 55-60%, no RWMAs, mild LVH, mild LAE, AoV sclerosis without stenosis; c.) TTE 03/25/2022: EF 60-65%, no RWMAs, AoV sclerosis without stenosis; d.) TTE 12/24/2022: EF 60-65%, no RWMAs, mild LVH, mild LAE.   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)    History of 2019 novel coronavirus disease (COVID-19) 04/16/2020   Homelessness    a.) limited on placement as patient is listed on [place] sex offender listing for exploitation of a minor   Hypertension    Infestation by bed bug 02/2022   Migraine    Obesity    PTSD (post-traumatic stress disorder)    a.) brief training in the [employer] at age 78; someone was killed in front of him   Sleep apnea    a.) no nocturnal PAP therapy   Suicidal ideations    T2DM (type 2 diabetes mellitus) (HCC)    Past Surgical History:  Procedure Laterality Date   CARDIAC CATHETERIZATION     CHOLECYSTECTOMY     EYE SURGERY     INNER EAR SURGERY     NOSE SURGERY  XI ROBOTIC LAPAROSCOPIC ASSISTED APPENDECTOMY N/A 11/05/2022   Procedure: XI ROBOTIC LAPAROSCOPIC ASSISTED APPENDECTOMY;  Surgeon: Carolan Shiver, MD;  Location: [facility] ORS;  Service: General;  Laterality: N/A;    Allergies  Allergies  Allergen Reactions   Bee Venom Anaphylaxis   Codeine Itching    Only itching. Recently allergy tested at [facility]   Lactulose Diarrhea    Patient states causes diarrhea   Novocain [Procaine]    Sulfa Antibiotics Other (See Comments)    Home Medications    Prior to Admission medications   Medication Sig Start Date End Date Taking? Authorizing Provider   amoxicillin-clavulanate (AUGMENTIN) 875-125 MG tablet Take 1 tablet by mouth every 12 (twelve) hours for 11 doses. 06/21/23 06/27/23  Alford Highland, MD  atorvastatin (LIPITOR) 40 MG tablet Take 1 tablet (40 mg total) by mouth daily. 06/21/23 06/20/24  Alford Highland, MD  citalopram (CELEXA) 20 MG tablet Take 1 tablet (20 mg total) by mouth daily. 06/21/23 07/21/23  Alford Highland, MD  empagliflozin (JARDIANCE) 10 MG TABS tablet Take 1 tablet (10 mg total) by mouth daily before breakfast. 06/21/23   Alford Highland, MD  furosemide (LASIX) 20 MG tablet Take 1 tablet (20 mg total) by mouth daily. 06/21/23   Alford Highland, MD  gabapentin (NEURONTIN) 300 MG capsule Take 300 mg by mouth 2 (two) times daily. 06/07/23   [provider]  losartan (COZAAR) 50 MG tablet Take 1 tablet (50 mg total) by mouth daily. 06/21/23   Alford Highland, MD  Multiple Vitamin (MULTIVITAMIN WITH MINERALS) TABS tablet Take 1 tablet by mouth daily.    [provider]  ondansetron (ZOFRAN) 4 MG tablet Take 1 tablet (4 mg total) by mouth every 6 (six) hours as needed for nausea. 06/21/23   Alford Highland, MD  oxyCODONE-acetaminophen (PERCOCET/ROXICET) 5-325 MG tablet Take 1 tablet by mouth every 6 (six) hours as needed for severe pain (pain score 7-10). 06/21/23   Alford Highland, MD  pantoprazole (PROTONIX) 40 MG tablet Take 1 tablet (40 mg total) by mouth daily. 06/21/23 07/21/23  Alford Highland, MD  QUEtiapine (SEROQUEL) 50 MG tablet Take 1 tablet (50 mg total) by mouth at bedtime. 06/21/23   Alford Highland, MD  rifaximin (XIFAXAN) 550 MG TABS tablet Take 1 tablet (550 mg total) by mouth 2 (two) times daily. 06/21/23   Alford Highland, MD  spironolactone (ALDACTONE) 25 MG tablet Take 0.5 tablets (12.5 mg total) by mouth daily. 06/21/23 08/20/23  Alford Highland, MD    Physical Exam    Vital Signs:  [name] does not have vital signs available for review today.  Given telephonic  nature of communication, physical exam is limited. AAOx3. NAD. Normal affect.  Speech and respirations are unlabored.  Accessory Clinical Findings    None  Assessment & Plan    1.  Preoperative Cardiovascular Risk Assessment: -Patient's RCRI score is 6.6%  The patient affirms he has been doing well without any new cardiac symptoms. They are able to achieve 6 METS without cardiac limitations. Therefore, based on ACC/AHA guidelines, the patient would be at acceptable risk for the planned procedure without further cardiovascular testing. The patient was advised that if he develops new symptoms prior to surgery to contact our office to arrange for a follow-up visit, and he verbalized understanding.   The patient was advised that if he develops new symptoms prior to surgery to contact our office to arrange for a follow-up visit, and he verbalized understanding.  No cardiac medications required to  be held  A copy of this note will be routed to requesting surgeon.  Time:   Today, I have spent 10 minutes with the patient with telehealth technology discussing medical history, symptoms, and management plan.     Napoleon Form, Leodis Rains, NP  06/26/2023, 8:02 AM

## 2023-06-27 ENCOUNTER — Encounter
Admission: RE | Admit: 2023-06-27 | Discharge: 2023-06-27 | Disposition: A | Discharge status: Home / Self Care | Payer: 59 | Source: Ambulatory Visit | Attending: Neurosurgery | Admitting: Neurosurgery

## 2023-06-27 ENCOUNTER — Other Ambulatory Visit: Payer: Self-pay

## 2023-06-27 ENCOUNTER — Inpatient Hospital Stay: Admission: RE | Admit: 2023-06-27 | Payer: 59 | Source: Ambulatory Visit

## 2023-06-27 VITALS — BP 108/66 | HR 56 | Resp 18 | Wt 199.3 lb

## 2023-06-27 DIAGNOSIS — E119 Type 2 diabetes mellitus without complications: Secondary | ICD-10-CM | POA: Insufficient documentation

## 2023-06-27 DIAGNOSIS — Z01812 Encounter for preprocedural laboratory examination: Secondary | ICD-10-CM | POA: Diagnosis present

## 2023-06-27 DIAGNOSIS — K7469 Other cirrhosis of liver: Secondary | ICD-10-CM | POA: Diagnosis not present

## 2023-06-27 DIAGNOSIS — E1165 Type 2 diabetes mellitus with hyperglycemia: Secondary | ICD-10-CM

## 2023-06-27 HISTORY — DX: Unspecified osteoarthritis, unspecified site: M19.90

## 2023-06-27 HISTORY — DX: Anemia, unspecified: D64.9

## 2023-06-27 HISTORY — DX: Gastro-esophageal reflux disease without esophagitis: K21.9

## 2023-06-27 LAB — URINALYSIS, ROUTINE W REFLEX MICROSCOPIC
Bacteria, UA: NONE SEEN
Bilirubin Urine: NEGATIVE
Glucose, UA: >=500 mg/dL — AB
Hgb urine dipstick: NEGATIVE
Ketones, ur: NEGATIVE mg/dL
Leukocytes,Ua: NEGATIVE
Nitrite: NEGATIVE
Protein, ur: NEGATIVE mg/dL
Specific Gravity, Urine: 1.008 (ref 1.005–1.030)
pH: 6 (ref 5.0–8.0)

## 2023-06-27 LAB — CBC
HCT: 35.4 % — ABNORMAL LOW (ref 39.0–52.0)
Hemoglobin: 11.8 g/dL — ABNORMAL LOW (ref 13.0–17.0)
MCH: 30.6 pg (ref 26.0–34.0)
MCHC: 33.3 g/dL (ref 30.0–36.0)
MCV: 91.9 fL (ref 80.0–100.0)
Platelets: 77 10*3/uL — ABNORMAL LOW (ref 150–400)
RBC: 3.85 MIL/uL — ABNORMAL LOW (ref 4.22–5.81)
RDW: 16.9 % — ABNORMAL HIGH (ref 11.5–15.5)
WBC: 2.2 10*3/uL — ABNORMAL LOW (ref 4.0–10.5)
nRBC: 0 % (ref 0.0–0.2)

## 2023-06-27 LAB — SURGICAL PCR SCREEN
MRSA, PCR: NEGATIVE
Staphylococcus aureus: NEGATIVE

## 2023-06-27 LAB — HEMOGLOBIN A1C
Hgb A1c MFr Bld: 5 % (ref 4.8–5.6)
Mean Plasma Glucose: 96.8 mg/dL

## 2023-06-27 NOTE — Patient Instructions (Addendum)
Your procedure is scheduled on: 07/01/23 - Monday Report to the Registration Desk on the 1st floor of the [facility]. To find out your arrival time, please call [phone] between 1PM - 3PM on: 06/28/23 - Friday If your arrival time is 6:00 am, do not arrive before that time as the [facility] entrance doors do not open until 6:00 am.  REMEMBER: Instructions that are not followed completely may result in serious medical risk, up to and including death; or upon the discretion of your surgeon and anesthesiologist your surgery may need to be rescheduled.  Do not eat food after midnight the night before surgery.  No gum chewing or hard candies.  You may however, drink CLEAR liquids up to 2 hours before you are scheduled to arrive for your surgery. Do not drink anything within 2 hours of your scheduled arrival time.  Clear liquids include: - water   You may continue any Anti-inflammatories (NSAIDS) such as Advil, Aleve, Ibuprofen, Motrin, Naproxen, Naprosyn and Aspirin based products such as Excedrin, Goody's Powder, BC Powder if needed, but will need to avoid these medications up to 3 months after your surgery.  Stop ANY OVER THE COUNTER supplements until after surgery: Multiple Vitamin   HOLD losartan (COZAAR), furosemide (LASIX)  and spironolactone (ALDACTONE) on the day of surgery.  Aspirin:  OK to stay on aspirin 81mg    Empagliflozin (Jardiance): hold 3 days prior to surgery beginning 06/28/23.   ON THE DAY OF SURGERY ONLY TAKE THESE MEDICATIONS WITH SIPS OF WATER:  atorvastatin (LIPITOR ) citalopram (CELEXA)  gabapentin (NEURONTIN)  pantoprazole (PROTONIX)  rifaximin (XIFAXAN)   No Alcohol for 24 hours before or after surgery.  No Smoking including e-cigarettes for 24 hours before surgery.  No chewable tobacco products for at least 6 hours before surgery.  No nicotine patches on the day of surgery.  Do not use any "recreational" drugs for at least a week (preferably 2  weeks) before your surgery.  Please be advised that the combination of cocaine and anesthesia may have negative outcomes, up to and including death. If you test positive for cocaine, your surgery will be cancelled.  On the morning of surgery brush your teeth with toothpaste and water, you may rinse your mouth with mouthwash if you wish. Do not swallow any toothpaste or mouthwash.  Use CHG Soap or wipes as directed on instruction sheet.  Do not wear jewelry, make-up, hairpins, clips or nail polish.  For welded (permanent) jewelry: bracelets, anklets, waist bands, etc.  Please have this removed prior to surgery.  If it is not removed, there is a chance that hospital personnel will need to cut it off on the day of surgery.  Do not wear lotions, powders, or perfumes.   Do not shave body hair from the neck down 48 hours before surgery.  Contact lenses, hearing aids and dentures may not be worn into surgery.  Do not bring valuables to the hospital. [facility] is not responsible for any missing/lost belongings or valuables.   Notify your doctor if there is any change in your medical condition (cold, fever, infection).  Wear comfortable clothing (specific to your surgery type) to the hospital.  After surgery, you can help prevent lung complications by doing breathing exercises.  Take deep breaths and cough every 1-2 hours. Your doctor may order a device called an Incentive Spirometer to help you take deep breaths. When coughing or sneezing, hold a pillow firmly against your incision with both hands. This  is called "splinting." Doing this helps protect your incision. It also decreases belly discomfort.  If you are being admitted to the hospital overnight, leave your suitcase in the car. After surgery it may be brought to your room.  In case of increased patient census, it may be necessary for you, the patient, to continue your postoperative care in the Same Day Surgery department.  If you  are being discharged the day of surgery, you will not be allowed to drive home. You will need a responsible individual to drive you home and stay with you for 24 hours after surgery.   If you are taking public transportation, you will need to have a responsible individual with you.  Please call the Pre-admissions Testing Dept. at [phone] if you have any questions about these instructions.  Surgery Visitation Policy:  Patients having surgery or a procedure may have two visitors.  Children under the age of 24 must have an adult with them who is not the patient.  Inpatient Visitation:    Visiting hours are 7 a.m. to 8 p.m. Up to four visitors are allowed at one time in a patient room. The visitors may rotate out with other people during the day.  One visitor age 72 or older may stay with the patient overnight and must be in the room by 8 p.m.    Pre-operative 5 CHG Bath Instructions   You can play a key role in reducing the risk of infection after surgery. Your skin needs to be as free of germs as possible. You can reduce the number of germs on your skin by washing with CHG (chlorhexidine gluconate) soap before surgery. CHG is an antiseptic soap that kills germs and continues to kill germs even after washing.   DO NOT use if you have an allergy to chlorhexidine/CHG or antibacterial soaps. If your skin becomes reddened or irritated, stop using the CHG and notify one of our RNs at [phone].   Please shower with the CHG soap starting 4 days before surgery using the following schedule: 10/31 - 11/04.    Please keep in mind the following:  DO NOT shave, including legs and underarms, starting the day of your first shower.   You may shave your face at any point before/day of surgery.  Place clean sheets on your bed the day you start using CHG soap. Use a clean washcloth (not used since being washed) for each shower. DO NOT sleep with pets once you start using the CHG.   CHG  Shower Instructions:  If you choose to wash your hair and private area, wash first with your normal shampoo/soap.  After you use shampoo/soap, rinse your hair and body thoroughly to remove shampoo/soap residue.  Turn the water OFF and apply about 3 tablespoons (45 ml) of CHG soap to a CLEAN washcloth.  Apply CHG soap ONLY FROM YOUR NECK DOWN TO YOUR TOES (washing for 3-5 minutes)  DO NOT use CHG soap on face, private areas, open wounds, or sores.  Pay special attention to the area where your surgery is being performed.  If you are having back surgery, having someone wash your back for you may be helpful. Wait 2 minutes after CHG soap is applied, then you may rinse off the CHG soap.  Pat dry with a clean towel  Put on clean clothes/pajamas   If you choose to wear lotion, please use ONLY the CHG-compatible lotions on the back of this paper.     Additional  instructions for the day of surgery: DO NOT APPLY any lotions, deodorants, cologne, or perfumes.   Put on clean/comfortable clothes.  Brush your teeth.  Ask your nurse before applying any prescription medications to the skin.      CHG Compatible Lotions   Aveeno Moisturizing lotion  Cetaphil Moisturizing Cream  Cetaphil Moisturizing Lotion  Clairol Herbal Essence Moisturizing Lotion, Dry Skin  Clairol Herbal Essence Moisturizing Lotion, Extra Dry Skin  Clairol Herbal Essence Moisturizing Lotion, Normal Skin  Curel Age Defying Therapeutic Moisturizing Lotion with Alpha Hydroxy  Curel Extreme Care Body Lotion  Curel Soothing Hands Moisturizing Hand Lotion  Curel Therapeutic Moisturizing Cream, Fragrance-Free  Curel Therapeutic Moisturizing Lotion, Fragrance-Free  Curel Therapeutic Moisturizing Lotion, Original Formula  Eucerin Daily Replenishing Lotion  Eucerin Dry Skin Therapy Plus Alpha Hydroxy Crme  Eucerin Dry Skin Therapy Plus Alpha Hydroxy Lotion  Eucerin Original Crme  Eucerin Original Lotion  Eucerin Plus Crme  Eucerin Plus Lotion  Eucerin TriLipid Replenishing Lotion  Keri Anti-Bacterial Hand Lotion  Keri Deep Conditioning Original Lotion Dry Skin Formula Softly Scented  Keri Deep Conditioning Original Lotion, Fragrance Free Sensitive Skin Formula  Keri Lotion Fast Absorbing Fragrance Free Sensitive Skin Formula  Keri Lotion Fast Absorbing Softly Scented Dry Skin Formula  Keri Original Lotion  Keri Skin Renewal Lotion Keri Silky Smooth Lotion  Keri Silky Smooth Sensitive Skin Lotion  Nivea Body Creamy Conditioning Oil  Nivea Body Extra Enriched Teacher, adult education Moisturizing Lotion Nivea Crme  Nivea Skin Firming Lotion  NutraDerm 30 Skin Lotion  NutraDerm Skin Lotion  NutraDerm Therapeutic Skin Cream  NutraDerm Therapeutic Skin Lotion  ProShield Protective Hand Cream  Provon moisturizing lotion

## 2023-06-27 NOTE — Progress Notes (Signed)
  [facility] Perioperative Services: Pre-Admission/Anesthesia Testing  Abnormal Lab Notification   Date: 06/27/23  Name: JAHSI PADBERG MRN:   [id number]  Re: Abnormal labs noted during PAT appointment   Notified:    Provider Name Provider Role Notification Mode  Venetia Night, MD Neurosurgery (Surgeon) Routed and/or faxed via [facility]   ABNORMAL LAB VALUE(S):   Lab Results  Component Value Date   WBC 2.2 (L) 06/27/2023   Lab Results  Component Value Date   PLT 77 (L) 06/27/2023   Clinical Information and Notes:  [name] is scheduled for a ANTERIOR CERVICAL DISCECTOMY AND FUSION (FORGE) on 07/01/2023.  Chart has been reviewed.    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 CT imaging revealed a normal splenic size, thus platelet sequestration not of concern.    Leukopenia likely the result of patients recent treatment with CEFTRIAXONE (inpatient), followed by continued treatment with AMOXICILLIN-CLAVULANATE (outpatient) for sepsis bacteremia secondary to Escherichia coli. WBC counts were WNL prior to his sepsis admission.  Case has been discussed with anesthesia attending Joelene Millin, MD). From an anesthesia, we are ok to proceed as long as surgeon is aware of the patient's platelet counts and is comfortable proceeding.   Quentin Mulling, MSN, APRN, FNP-C, CEN [facility]  Perioperative Services Nurse Practitioner Phone: [phone] Fax: [phone] 06/27/23 12:27 PM

## 2023-06-28 ENCOUNTER — Encounter: Payer: Self-pay | Admitting: Neurosurgery

## 2023-06-28 NOTE — Progress Notes (Incomplete)
Perioperative / Anesthesia Services  Pre-Admission Testing Clinical Review / Pre-Operative Anesthesia Consult  Date: 06/29/23  Patient Demographics:  Name: [name] DOB:   November 21, 1950 MRN:   [id number]  Planned Surgical Procedure(s):    Case: [id number] Date/Time: 07/01/23 1215   Procedure: ANTERIOR CERVICAL DISCECTOMY AND FUSION (FORGE) - Keep as last case of the day per Dr Myer Haff   Anesthesia type: General   Pre-op diagnosis:      G95.9 Cervical myelopathy     M48.02 Cervical stenosis of spinal canal   Location: [facility] OR ROOM 03 / [facility]   Surgeons: Venetia Night, MD     NOTE: Available PAT nursing documentation and vital signs have been reviewed. Clinical nursing staff has updated patient's PMH/PSHx, current medication list, and drug allergies/intolerances to ensure comprehensive history available to assist in medical decision making as it pertains to the aforementioned surgical procedure and anticipated anesthetic course. Extensive review of available clinical information personally performed. [facility] PMH and PSHx updated with any diagnoses/procedures that  may have been inadvertently omitted during his intake with the pre-admission testing department's nursing staff.  Clinical Discussion:  [name] is a 72 y.o. male who is submitted for pre-surgical anesthesia review and clearance prior to him undergoing the above procedure. Patient is a Former Games developer. 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   Patient is followed by cardiology Johnnye Lana, MD). He was last seen in the cardiology clinic on 01/17/2023; notes reviewed. At the time of his clinic visit, patient with increased shortness of breath and pitting edema.  He has been seen by the heart failure APP the day prior to his clinic visit and received IV furosemide and oral potassium for 10 pound weight gain in the preceding 24 hours.  Patient denied any chest pain, PND, orthopnea, palpitations, weakness, fatigue, vertiginous symptoms, or presyncope/syncope.  Patient's main complaint was his significant lower back pain. Patient with a past medical history significant for cardiovascular diagnoses.   Most recent myocardial perfusion imaging study was performed on 10/04/2021 revealing a normal left ventricular systolic function with an EF of 58%.  There was no evidence of stress-induced myocardial ischemia or arrhythmia; no scintigraphic evidence of scar.  CT attenuation correction imaging revealed calcifications in the LAD.  Study determined to be low risk overall.  Most recent TTE was performed on 12/24/2022 revealing a normal left ventricular systolic function with an EF of 60 to 65%.  There were no regional wall motion abnormalities.  There was mild LVH.  Right ventricular size and function normal.  There was mild left atrial dilatation. All transvalvular gradients were noted to be normal providing no evidence suggestive of valvular stenosis. Aorta normal in size with no evidence of aneurysmal dilatation.  Blood pressure well controlled at 137/58 mmHg on currently prescribed diuretic (furosemide) and ARB (losartan) therapies.  Patient is on atorvastatin for his HLD diagnosis and ASCVD prevention. T2DM well controlled on currently prescribed regimen; last HgbA1c was 5.1 when checked on  12/24/2022.  Of note, since last being seen by cardiology,  A1c has been rechecked with further improvement to 5.0% on 06/27/2023.  Additionally, in the setting of known cardiovascular diagnoses and concurrent T2DM, patient is on an SGLT2i (empagliflozin) for added cardiovascular and renovascular protection. Patient does have an OSAH diagnosis, however he is noncompliant with prescribed nocturnal PAP therapy.  Compliance with therapy related to homelessness and financial constraints affecting his ability to afford DME.  Functional capacity somewhat limited by patient's age and multiple medical comorbidities.  With that said, he is able to complete all of his ADLs/IADLs independently without cardiovascular limitation.  Per the DASI, patient is able to exceed 4 METS of physical activity without experiencing angina/anginal equivalent symptoms.  Given his lower extremity edema and increased shortness of breath despite intravenous diuretics, his furosemide and losartan doses were increased.  Spironolactone was added.  Patient scheduled to follow-up regularly with outpatient cardiology and advanced heart failure clinic.  [name] is scheduled for an elective ANTERIOR CERVICAL DISCECTOMY AND FUSION (FORGE) on 07/01/2023 with Dr. Venetia Night, MD.  Given patient's past medical history significant for cardiovascular diagnoses, presurgical cardiac clearance was sought by the PAT team.  Per cardiology, "patient's RCRI score is 6.6%. The patient affirms he has been doing well without any new cardiac symptoms. They are able to achieve 6 METS without cardiac limitations. Therefore, based on ACC/AHA guidelines, the patient would be at acceptable risk for the planned procedure without further cardiovascular testing".  In review of his medication reconciliation, the patient is not noted to be taking any type of anticoagulation or antiplatelet therapies that would need to be held during his perioperative  course.  Patient denies previous perioperative complications with anesthesia in the past. In review of the available records, it is noted that patient underwent a general anesthetic course here at [facility] in 10/2022 without documented complications.      06/27/2023    9:22 AM 06/21/2023   11:24 AM 06/21/2023    7:14 AM  Vitals with BMI  Weight 199 lbs 5 oz    BMI 32.18    Systolic 108 114 161  Diastolic 66 74 50  Pulse 56 52 52    Providers/Specialists:   NOTE: Primary physician provider listed below. Patient may have been seen by APP or partner within same practice.   PROVIDER ROLE / SPECIALTY LAST Donalynn Furlong, MD Neurosurgery (Surgeon) 06/21/2023 - seen during inpatient consult  [name], MD Primary Care Provider 10/30/2022  Julien Nordmann, MD Cardiology 04/30/2022 (inpatient consult); update preop APP call on 06/26/2023  Dorthula Nettles, MD Advanced Heart Failure 01/17/2023   Allergies:  Bee venom, Codeine, Lactulose, Novocain [procaine], and Sulfa antibiotics  Current Home Medications:   No current facility-administered medications for this encounter.    atorvastatin (LIPITOR) 40 MG tablet   citalopram (CELEXA) 20 MG tablet   empagliflozin (JARDIANCE) 10 MG TABS tablet   furosemide (LASIX) 20 MG tablet   gabapentin (NEURONTIN) 300 MG capsule   losartan (COZAAR) 50 MG tablet   Multiple Vitamin (MULTIVITAMIN WITH MINERALS) TABS tablet   ondansetron (ZOFRAN) 4 MG tablet   oxyCODONE-acetaminophen (PERCOCET/ROXICET) 5-325 MG tablet   pantoprazole (PROTONIX) 40 MG tablet   QUEtiapine (SEROQUEL) 50 MG tablet   rifaximin (XIFAXAN) 550 MG TABS tablet   spironolactone (ALDACTONE) 25 MG tablet   History:   Past Medical History:  Diagnosis Date   (HFpEF) heart failure with preserved ejection fraction (HCC)    a.) TTE 03/20/2018: EF 60-65%, no  RWMAs, mild LAE, mild-mod MR, PASP 42; b.) TTE 03/31/2021: EF  55-60%, no RWMAs, mild LVH, mild LAE, AoV sclerosis without stenosis; c.) TTE 03/25/2022: EF 60-65%, no RWMAs, AoV sclerosis without stenosis; d.) TTE 12/24/2022: EF 60-65%, no RWMAs, mild LVH, mild LAE.   Anasarca    Anemia    Anxiety    Aortic atherosclerosis (HCC)    Arthritis    Asthma    Bacteremia due to Escherichia coli 05/2023   Bacteremia due to Klebsiella pneumoniae 2023   Bilateral carotid artery disease (HCC)    Bradycardia    CAD (coronary artery disease) 10/21/2006   a.) MV 10/21/2006: small reversible defect involving the apical  lateral wall c/w possible ischemia; b.) MV: 03/24/2018: no ischemia; c.) MV 10/04/2021: LAD calcifications   Cervical radiculopathy    Chest pain    a.) MV 03/21/2018: no ischemia; b.) MV 10/04/2021: no ischemia   Chronic back pain    Chronic common bile duct dilatation    Cirrhosis (HCC)    a.) (+) hepatic lesion on CT; likely hepatocellular carcinoma; b.) CT evidence of associated portal hypertension; c.) (+) abdominopelvic anasarca   COPD (chronic obstructive pulmonary disease) (HCC)    DDD (degenerative disc disease), lumbar    Depression    Frequent falls    GERD (gastroesophageal reflux disease)    HBV (hepatitis B virus) infection    HCV (hepatitis C virus)    Hernia of anterior abdominal wall    History of 2019 novel coronavirus disease (COVID-19) 04/16/2020   Homelessness    a.) limited on placement as patient is listed on [place] sex offender listing for exploitation of a minor (possession of child pornography); incarcerated 12 years (released 11/2017)   Hypertension    Infestation by bed bug 02/2022   Malingering    Migraine    Nephrolithiasis    Obesity    Portal hypertension (HCC)    PTSD (post-traumatic stress disorder)    a.) brief training in the [employer] at age 28; someone was killed in front of him   Pulmonary HTN (HCC)    a.) multiple CT scans desmontrating dilated PA c/w pHTN; b.) TTE 03/20/2018: PASP 42 mmHg   Sleep apnea     a.) no nocturnal PAP therapy   Stenosis of cervical spine with myelopathy (HCC)    Suicidal ideations    T2DM (type 2 diabetes mellitus) (HCC)    Thrombocytopenia (HCC)    a.) related to hepatic cirrhosis; spleen normal in size   TIA (transient ischemic attack)    Past Surgical History:  Procedure Laterality Date   APPENDECTOMY     CARDIAC CATHETERIZATION     CHOLECYSTECTOMY     EYE SURGERY     INNER EAR SURGERY     NOSE SURGERY     XI ROBOTIC LAPAROSCOPIC ASSISTED APPENDECTOMY N/A 11/05/2022   Procedure: XI ROBOTIC LAPAROSCOPIC ASSISTED APPENDECTOMY;  Surgeon: Carolan Shiver, MD;  Location: [facility] ORS;  Service: General;  Laterality: N/A;   Family History  Problem Relation Age of Onset   Hypertension Mother    Heart disease Mother    Heart failure Maternal Grandmother    Social History   Tobacco Use   Smoking status: Former   Smokeless tobacco: Never  Substance Use Topics   Alcohol use: Never   Drug use: Never    Pertinent Clinical Results:  LABS:   Hospital Outpatient Visit on 06/27/2023  Component Date Value Ref Range Status   ABO/RH(D) 06/27/2023 AB NEG  Final   Antibody Screen 06/27/2023 NEG   Final   Sample Expiration 06/27/2023 07/11/2023,2359   Final   Extend sample reason 06/27/2023    Final                   Value:NO TRANSFUSIONS OR PREGNANCY IN THE PAST 3 MONTHS Performed at [facility], [address] [place], [place] [address]    MRSA, PCR 06/27/2023 NEGATIVE  NEGATIVE Final   Staphylococcus aureus 06/27/2023 NEGATIVE  NEGATIVE Final   Comment: (NOTE) The Xpert SA Assay (FDA approved for NASAL specimens in patients 60 years of age and older), is one component of a comprehensive surveillance program. It is not intended to diagnose infection nor to guide or monitor treatment. Performed at [facility], [address] [place], [place] [address]    WBC 06/27/2023 2.2 (L)  4.0 - 10.5 K/uL Final   RBC 06/27/2023 3.85 (L)   4.22 - 5.81 MIL/uL Final   Hemoglobin 06/27/2023 11.8 (L)  13.0 - 17.0 g/dL Final   HCT 84/69/6295 35.4 (L)  39.0 - 52.0 % Final   MCV 06/27/2023 91.9  80.0 - 100.0 fL Final   MCH 06/27/2023 30.6  26.0 - 34.0 pg Final   MCHC 06/27/2023 33.3  30.0 - 36.0 g/dL Final   RDW 28/41/3244 16.9 (H)  11.5 - 15.5 % Final   Platelets 06/27/2023 77 (L)  150 - 400 K/uL Final   Comment: Immature Platelet Fraction may be clinically indicated, consider ordering this additional test [id number]    nRBC 06/27/2023 0.0  0.0 - 0.2 % Final   Performed at [facility], [address] [place], [place] [address]   Hgb A1c MFr Bld 06/27/2023 5.0  4.8 - 5.6 % Final   Comment: (NOTE) Pre diabetes:          5.7%-6.4%  Diabetes:              >6.4%  Glycemic control for   <7.0% adults with diabetes    Mean Plasma Glucose 06/27/2023 96.8  mg/dL Final   Performed at [facility], [address] [place], [place] [address]   Color, Urine 06/27/2023 YELLOW (A)  YELLOW Final   APPearance 06/27/2023 CLEAR (A)  CLEAR Final   Specific Gravity, Urine 06/27/2023 1.008  1.005 - 1.030 Final   pH 06/27/2023 6.0  5.0 - 8.0 Final   Glucose, UA 06/27/2023 >=500 (A)  NEGATIVE mg/dL Final   Hgb urine dipstick 06/27/2023 NEGATIVE  NEGATIVE Final   Bilirubin Urine 06/27/2023 NEGATIVE  NEGATIVE Final   Ketones, ur 06/27/2023 NEGATIVE  NEGATIVE mg/dL Final   Protein, ur 59/56/3875 NEGATIVE  NEGATIVE mg/dL Final   Nitrite 64/33/2951 NEGATIVE  NEGATIVE Final   Leukocytes,Ua 06/27/2023 NEGATIVE  NEGATIVE Final   RBC / HPF 06/27/2023 0-5  0 - 5 RBC/hpf Final   WBC, UA 06/27/2023 0-5  0 - 5 WBC/hpf Final   Bacteria, UA 06/27/2023 NONE SEEN  NONE SEEN Final   Squamous Epithelial / HPF 06/27/2023 0-5  0 - 5 /HPF Final   Performed at [facility], [address] [place], [place] [address]    ECG: Date: 06/16/2023 Time ECG obtained: 1226 PM Rate: 61 bpm Rhythm: Junctional rhythm with occasional  PVCs Axis (leads I and aVF): Normal Intervals: QRS 100 ms. QTc 412 ms. ST segment and T wave changes: No evidence of acute ST segment elevation or depression.   Comparison: Previous tracing obtained on 06/06/2023  showed normal sinus with nonspecific T wave abnormalities at a rate of 63 bpm.   IMAGING / PROCEDURES: MR CERVICAL SPINE W WO CONTRAST performed on 06/20/2023 No acute osseous finding. Multilevel disc and facet degeneration throughout the cervical spine resulting in moderate spinal canal stenosis at C3-C4 at C5-C6 and severe spinal canal stenosis with cord compression at C4-C5. No definite cord signal abnormality. Severe bilateral neural foraminal stenosis at C3-C4 through C5-C6. Findings are overall likely not significantly changed compared to the study from 04/28/2022 allowing for motion degradation on that study.  MR LUMBAR SPINE W WO CONTRAST performed on 06/20/2023 No acute finding or significant change since the study from 08/31/2022. Moderate facet arthropathy at L4-L5 and L5-S1 with mild perifacetal soft tissue edema on the left at L5-S1 which could reflect a source of pain. Mild bilateral subarticular zone narrowing and neural foraminal stenosis at L3-L4 and L4-L5.  No high-grade spinal canal or neural foraminal stenosis at any level.  CT ABDOMEN PELVIS W CONTRAST performed on 06/19/2023 Degenerative changes of lumbar spine are noted. Mild changes of anasarca are noted. Cirrhotic change of the liver with evidence of portal hypertension . Stable hernia in the left anterior abdominal wall. Nonobstructing right renal calculi as described. Chronic dilatation of the common bile duct stable from the prior exam.  TRANSTHORACIC ECHOCARDIOGRAM performed on 12/24/2022 Left ventricular ejection fraction, by estimation, is 60 to 65%. The left ventricle has normal function. The left ventricle has no regional wall motion abnormalities. There is mild left ventricular hypertrophy. Left  ventricular diastolic parameters were normal.  Right ventricular systolic function is normal. The right ventricular size is normal. Tricuspid regurgitation signal is inadequate for assessing PA pressure.  Left atrial size was mildly dilated.  The mitral valve is normal in structure. No evidence of mitral valve regurgitation. No evidence of mitral stenosis.  The aortic valve is normal in structure. Aortic valve regurgitation is not visualized. No aortic stenosis is present.   MYOCARDIAL PERFUSION IMAGING STUDY (LEXISCAN) performed on 10/04/2021 Normal left ventricular systolic function with a normal LVEF of 58% Normal myocardial thickening and wall motion Left ventricular cavity size normal SPECT images demonstrate homogenous tracer distribution throughout the myocardium No evidence of stress-induced myocardial ischemia or arrhythmia LAD calcifications noted Normal low risk study  Impression and Plan:  [name] has been referred for pre-anesthesia review and clearance prior to him undergoing the planned anesthetic and procedural courses. Available labs, pertinent testing, and imaging results were personally reviewed by me in preparation for upcoming operative/procedural course. [facility] medical record has been updated following extensive record review and patient interview with PAT staff.   This patient has been appropriately cleared by cardiology with an overall ACCEPTABLE risk of experiencing significant perioperative cardiovascular complications. Based on clinical review performed today (06/29/23), barring any significant acute changes in the patient's overall condition, it is anticipated that he will be able to proceed with the planned surgical intervention. Any acute changes in clinical condition may necessitate his procedure being postponed and/or cancelled. Patient will meet with anesthesia team (MD and/or CRNA) on the day of his procedure for preoperative evaluation/assessment.  Questions regarding anesthetic course will be fielded at that time.   Pre-surgical instructions were reviewed with the patient during his PAT appointment, and questions were fielded to satisfaction by PAT clinical staff. He has been instructed on which medications that he will need to hold prior to surgery, as well as the ones that have been deemed safe/appropriate to take  on the day of his procedure. As part of the general education provided by PAT, patient made aware both verbally and in writing, that he would need to abstain from the use of any illegal substances during his perioperative course.  He was advised that failure to follow the provided instructions could necessitate case cancellation or result in serious perioperative complications up to and including death. Patient encouraged to contact PAT and/or his surgeon's office to discuss any questions or concerns that may arise prior to surgery; verbalized understanding.   Quentin Mulling, MSN, APRN, FNP-C, [facility]  Perioperative Services Nurse Practitioner Phone: [phone] Fax: [phone] 06/29/23 3:36 PM  NOTE: This note has been prepared using Dragon dictation software. Despite my best ability to proofread, there is always the potential that unintentional transcriptional errors may still occur from this process.

## 2023-06-29 ENCOUNTER — Encounter: Payer: Self-pay | Admitting: Neurosurgery

## 2023-07-01 ENCOUNTER — Inpatient Hospital Stay: Payer: 59 | Admitting: Urgent Care

## 2023-07-01 ENCOUNTER — Encounter: Payer: Self-pay | Admitting: Neurosurgery

## 2023-07-01 ENCOUNTER — Other Ambulatory Visit: Payer: Self-pay

## 2023-07-01 ENCOUNTER — Inpatient Hospital Stay
Admission: RE | Admit: 2023-07-01 | Discharge: 2023-07-02 | DRG: 472 | Disposition: A | Discharge status: Home Health | Payer: 59 | Attending: Neurosurgery | Admitting: Neurosurgery

## 2023-07-01 ENCOUNTER — Encounter
Admission: RE | Disposition: A | Discharge status: Home Health | Payer: Self-pay | Source: Home / Self Care | Attending: Neurosurgery

## 2023-07-01 ENCOUNTER — Inpatient Hospital Stay: Payer: 59

## 2023-07-01 DIAGNOSIS — F4310 Post-traumatic stress disorder, unspecified: Secondary | ICD-10-CM | POA: Diagnosis present

## 2023-07-01 DIAGNOSIS — Z01812 Encounter for preprocedural laboratory examination: Secondary | ICD-10-CM

## 2023-07-01 DIAGNOSIS — Z8249 Family history of ischemic heart disease and other diseases of the circulatory system: Secondary | ICD-10-CM

## 2023-07-01 DIAGNOSIS — D696 Thrombocytopenia, unspecified: Secondary | ICD-10-CM | POA: Diagnosis present

## 2023-07-01 DIAGNOSIS — Z8616 Personal history of COVID-19: Secondary | ICD-10-CM | POA: Diagnosis not present

## 2023-07-01 DIAGNOSIS — K766 Portal hypertension: Secondary | ICD-10-CM | POA: Diagnosis present

## 2023-07-01 DIAGNOSIS — K219 Gastro-esophageal reflux disease without esophagitis: Secondary | ICD-10-CM | POA: Diagnosis present

## 2023-07-01 DIAGNOSIS — I5032 Chronic diastolic (congestive) heart failure: Secondary | ICD-10-CM | POA: Diagnosis present

## 2023-07-01 DIAGNOSIS — J4489 Other specified chronic obstructive pulmonary disease: Secondary | ICD-10-CM | POA: Diagnosis present

## 2023-07-01 DIAGNOSIS — Z5900 Homelessness unspecified: Secondary | ICD-10-CM | POA: Diagnosis not present

## 2023-07-01 DIAGNOSIS — I110 Hypertensive heart disease with heart failure: Secondary | ICD-10-CM | POA: Diagnosis present

## 2023-07-01 DIAGNOSIS — G992 Myelopathy in diseases classified elsewhere: Secondary | ICD-10-CM | POA: Diagnosis present

## 2023-07-01 DIAGNOSIS — E1165 Type 2 diabetes mellitus with hyperglycemia: Secondary | ICD-10-CM

## 2023-07-01 DIAGNOSIS — I272 Pulmonary hypertension, unspecified: Secondary | ICD-10-CM | POA: Diagnosis present

## 2023-07-01 DIAGNOSIS — F32A Depression, unspecified: Secondary | ICD-10-CM | POA: Diagnosis present

## 2023-07-01 DIAGNOSIS — Z888 Allergy status to other drugs, medicaments and biological substances status: Secondary | ICD-10-CM

## 2023-07-01 DIAGNOSIS — I700 Atherosclerosis of aorta: Secondary | ICD-10-CM | POA: Diagnosis present

## 2023-07-01 DIAGNOSIS — G4733 Obstructive sleep apnea (adult) (pediatric): Secondary | ICD-10-CM | POA: Diagnosis present

## 2023-07-01 DIAGNOSIS — Z882 Allergy status to sulfonamides status: Secondary | ICD-10-CM

## 2023-07-01 DIAGNOSIS — Z885 Allergy status to narcotic agent status: Secondary | ICD-10-CM | POA: Diagnosis not present

## 2023-07-01 DIAGNOSIS — M4802 Spinal stenosis, cervical region: Secondary | ICD-10-CM | POA: Diagnosis present

## 2023-07-01 DIAGNOSIS — K7460 Unspecified cirrhosis of liver: Secondary | ICD-10-CM | POA: Diagnosis present

## 2023-07-01 DIAGNOSIS — Z91030 Bee allergy status: Secondary | ICD-10-CM | POA: Diagnosis not present

## 2023-07-01 DIAGNOSIS — G959 Disease of spinal cord, unspecified: Secondary | ICD-10-CM | POA: Diagnosis present

## 2023-07-01 DIAGNOSIS — Z87891 Personal history of nicotine dependence: Secondary | ICD-10-CM | POA: Diagnosis not present

## 2023-07-01 DIAGNOSIS — I2510 Atherosclerotic heart disease of native coronary artery without angina pectoris: Secondary | ICD-10-CM | POA: Diagnosis present

## 2023-07-01 DIAGNOSIS — R296 Repeated falls: Secondary | ICD-10-CM | POA: Diagnosis present

## 2023-07-01 DIAGNOSIS — Z79899 Other long term (current) drug therapy: Secondary | ICD-10-CM

## 2023-07-01 DIAGNOSIS — E119 Type 2 diabetes mellitus without complications: Secondary | ICD-10-CM | POA: Diagnosis present

## 2023-07-01 DIAGNOSIS — E669 Obesity, unspecified: Secondary | ICD-10-CM | POA: Diagnosis present

## 2023-07-01 DIAGNOSIS — Z8673 Personal history of transient ischemic attack (TIA), and cerebral infarction without residual deficits: Secondary | ICD-10-CM

## 2023-07-01 DIAGNOSIS — K1379 Other lesions of oral mucosa: Secondary | ICD-10-CM

## 2023-07-01 DIAGNOSIS — Z7984 Long term (current) use of oral hypoglycemic drugs: Secondary | ICD-10-CM

## 2023-07-01 DIAGNOSIS — Z6832 Body mass index (BMI) 32.0-32.9, adult: Secondary | ICD-10-CM

## 2023-07-01 DIAGNOSIS — Z01818 Encounter for other preprocedural examination: Secondary | ICD-10-CM

## 2023-07-01 HISTORY — DX: Atherosclerosis of aorta: I70.0

## 2023-07-01 HISTORY — DX: Other intervertebral disc degeneration, lumbar region without mention of lumbar back pain or lower extremity pain: M51.369

## 2023-07-01 HISTORY — DX: Myelopathy in diseases classified elsewhere: G99.2

## 2023-07-01 HISTORY — DX: Calculus of kidney: N20.0

## 2023-07-01 HISTORY — DX: Other specified diseases of biliary tract: K83.8

## 2023-07-01 HISTORY — PX: ANTERIOR CERVICAL DECOMP/DISCECTOMY FUSION: SHX1161

## 2023-07-01 HISTORY — DX: Transient cerebral ischemic attack, unspecified: G45.9

## 2023-07-01 HISTORY — DX: Portal hypertension: K76.6

## 2023-07-01 HISTORY — DX: Ventral hernia without obstruction or gangrene: K43.9

## 2023-07-01 HISTORY — DX: Malingerer (conscious simulation): Z76.5

## 2023-07-01 HISTORY — DX: Generalized edema: R60.1

## 2023-07-01 HISTORY — DX: Thrombocytopenia, unspecified: D69.6

## 2023-07-01 HISTORY — DX: Pulmonary hypertension, unspecified: I27.20

## 2023-07-01 HISTORY — DX: Spinal stenosis, cervical region: M48.02

## 2023-07-01 HISTORY — DX: Disorder of arteries and arterioles, unspecified: I77.9

## 2023-07-01 LAB — GLUCOSE, CAPILLARY
Glucose-Capillary: 66 mg/dL — ABNORMAL LOW (ref 70–99)
Glucose-Capillary: 76 mg/dL (ref 70–99)
Glucose-Capillary: 70 mg/dL (ref 70–99)
Glucose-Capillary: 99 mg/dL (ref 70–99)
Glucose-Capillary: 156 mg/dL — ABNORMAL HIGH (ref 70–99)
Glucose-Capillary: 149 mg/dL — ABNORMAL HIGH (ref 70–99)

## 2023-07-01 LAB — PREPARE PLATELET PHERESIS: Unit division: 0

## 2023-07-01 LAB — BPAM PLATELET PHERESIS
Blood Product Expiration Date: 202411042359
ISSUE DATE / TIME: 202411041556
Unit Type and Rh: 6200

## 2023-07-01 SURGERY — ANTERIOR CERVICAL DECOMPRESSION/DISCECTOMY FUSION 3 LEVELS
Anesthesia: General

## 2023-07-01 MED ORDER — EPHEDRINE 5 MG/ML INJ
INTRAVENOUS | Status: AC
  Filled 2023-07-01: qty 5

## 2023-07-01 MED ORDER — LOSARTAN POTASSIUM 50 MG PO TABS
50.0000 mg | ORAL_TABLET | Freq: Every day | ORAL | Status: DC
  Administered 2023-07-01 – 2023-07-02 (×2): 50 mg via ORAL

## 2023-07-01 MED ORDER — MIDAZOLAM HCL 2 MG/2ML IJ SOLN
INTRAMUSCULAR | Status: AC
  Filled 2023-07-01: qty 2

## 2023-07-01 MED ORDER — ONDANSETRON HCL 4 MG/2ML IJ SOLN
INTRAMUSCULAR | Status: DC | PRN
  Administered 2023-07-01: 4 mg via INTRAVENOUS

## 2023-07-01 MED ORDER — PROPOFOL 1000 MG/100ML IV EMUL
INTRAVENOUS | Status: AC
  Filled 2023-07-01: qty 100

## 2023-07-01 MED ORDER — OXYCODONE HCL 5 MG/5ML PO SOLN: 5.0000 mg | Freq: Once | ORAL | Status: DC | PRN

## 2023-07-01 MED ORDER — SUCCINYLCHOLINE CHLORIDE 200 MG/10ML IV SOSY
PREFILLED_SYRINGE | INTRAVENOUS | Status: AC
Start: 2023-07-01 — End: ?
  Filled 2023-07-01: qty 10

## 2023-07-01 MED ORDER — FUROSEMIDE 20 MG PO TABS
20.0000 mg | ORAL_TABLET | Freq: Every day | ORAL | Status: DC
  Administered 2023-07-02: 20 mg via ORAL

## 2023-07-01 MED ORDER — ENOXAPARIN SODIUM 40 MG/0.4ML IJ SOSY
40.0000 mg | PREFILLED_SYRINGE | INTRAMUSCULAR | Status: DC
  Administered 2023-07-02: 40 mg via SUBCUTANEOUS

## 2023-07-01 MED ORDER — QUETIAPINE FUMARATE 25 MG PO TABS
50.0000 mg | ORAL_TABLET | Freq: Every day | ORAL | Status: DC
  Administered 2023-07-01: 50 mg via ORAL
  Filled 2023-07-01: qty 2

## 2023-07-01 MED ORDER — DEXTROSE 50 % IV SOLN
25.0000 mL | Freq: Once | INTRAVENOUS | Status: AC
  Administered 2023-07-01: 25 mL via INTRAVENOUS

## 2023-07-01 MED ORDER — GABAPENTIN 300 MG PO CAPS
ORAL_CAPSULE | ORAL | Status: AC
Start: 2023-07-01 — End: ?
  Filled 2023-07-01: qty 1

## 2023-07-01 MED ORDER — KETOROLAC TROMETHAMINE 30 MG/ML IJ SOLN
INTRAMUSCULAR | Status: DC | PRN
  Administered 2023-07-01: 15 mg via INTRAVENOUS

## 2023-07-01 MED ORDER — SUCCINYLCHOLINE CHLORIDE 200 MG/10ML IV SOSY
PREFILLED_SYRINGE | INTRAVENOUS | Status: DC | PRN
  Administered 2023-07-01: 100 mg via INTRAVENOUS

## 2023-07-01 MED ORDER — REMIFENTANIL HCL 1 MG IV SOLR
INTRAVENOUS | Status: DC | PRN
  Administered 2023-07-01: .1 ug/kg/min via INTRAVENOUS

## 2023-07-01 MED ORDER — ACETAMINOPHEN 10 MG/ML IV SOLN
INTRAVENOUS | Status: DC | PRN
  Administered 2023-07-01: 1000 mg via INTRAVENOUS

## 2023-07-01 MED ORDER — MIDAZOLAM HCL 2 MG/2ML IJ SOLN
INTRAMUSCULAR | Status: DC | PRN
  Administered 2023-07-01: 2 mg via INTRAVENOUS

## 2023-07-01 MED ORDER — HYDROMORPHONE HCL 1 MG/ML IJ SOLN
INTRAMUSCULAR | Status: AC
  Filled 2023-07-01: qty 1

## 2023-07-01 MED ORDER — ONDANSETRON HCL 4 MG/2ML IJ SOLN: 4.0000 mg | Freq: Four times a day (QID) | INTRAMUSCULAR | Status: DC | PRN

## 2023-07-01 MED ORDER — DEXTROSE 50 % IV SOLN
INTRAVENOUS | Status: AC
  Filled 2023-07-01: qty 50

## 2023-07-01 MED ORDER — GLYCOPYRROLATE 0.2 MG/ML IJ SOLN
INTRAMUSCULAR | Status: AC
Start: 2023-07-01 — End: ?
  Filled 2023-07-01: qty 1

## 2023-07-01 MED ORDER — DOCUSATE SODIUM 100 MG PO CAPS
ORAL_CAPSULE | ORAL | Status: AC
  Filled 2023-07-01: qty 1

## 2023-07-01 MED ORDER — DOCUSATE SODIUM 100 MG PO CAPS
100.0000 mg | ORAL_CAPSULE | Freq: Two times a day (BID) | ORAL | Status: DC
  Administered 2023-07-01: 100 mg via ORAL

## 2023-07-01 MED ORDER — DEXTROSE 10 % IV SOLN
INTRAVENOUS | Status: DC
Start: 2023-07-01 — End: 2023-07-02

## 2023-07-01 MED ORDER — CHLORHEXIDINE GLUCONATE 0.12 % MT SOLN
OROMUCOSAL | Status: AC
  Filled 2023-07-01: qty 15

## 2023-07-01 MED ORDER — SODIUM CHLORIDE 0.9% FLUSH: 3.0000 mL | INTRAVENOUS | Status: DC | PRN

## 2023-07-01 MED ORDER — GABAPENTIN 300 MG PO CAPS
300.0000 mg | ORAL_CAPSULE | Freq: Two times a day (BID) | ORAL | Status: DC
  Administered 2023-07-01 – 2023-07-02 (×2): 300 mg via ORAL

## 2023-07-01 MED ORDER — 0.9 % SODIUM CHLORIDE (POUR BTL) OPTIME
TOPICAL | Status: DC | PRN
  Administered 2023-07-01: 280 mL

## 2023-07-01 MED ORDER — LIDOCAINE HCL (PF) 2 % IJ SOLN
INTRAMUSCULAR | Status: AC
Start: 2023-07-01 — End: ?
  Filled 2023-07-01: qty 5

## 2023-07-01 MED ORDER — PROPOFOL 10 MG/ML IV BOLUS
INTRAVENOUS | Status: DC | PRN
  Administered 2023-07-01: 120 ug via INTRAVENOUS
  Administered 2023-07-01: 100 ug/kg/min via INTRAVENOUS

## 2023-07-01 MED ORDER — DEXAMETHASONE SODIUM PHOSPHATE 10 MG/ML IJ SOLN
INTRAMUSCULAR | Status: DC | PRN
  Administered 2023-07-01: 10 mg via INTRAVENOUS

## 2023-07-01 MED ORDER — DROPERIDOL 2.5 MG/ML IJ SOLN: 0.6250 mg | Freq: Once | INTRAMUSCULAR | Status: DC | PRN

## 2023-07-01 MED ORDER — METHOCARBAMOL 500 MG PO TABS
500.0000 mg | ORAL_TABLET | Freq: Four times a day (QID) | ORAL | Status: DC | PRN
  Administered 2023-07-02: 500 mg via ORAL

## 2023-07-01 MED ORDER — CHLORHEXIDINE GLUCONATE 0.12 % MT SOLN
15.0000 mL | Freq: Once | OROMUCOSAL | Status: AC
  Administered 2023-07-01: 15 mL via OROMUCOSAL

## 2023-07-01 MED ORDER — POLYETHYLENE GLYCOL 3350 17 G PO PACK: 17.0000 g | PACK | Freq: Every day | ORAL | Status: DC | PRN

## 2023-07-01 MED ORDER — FENTANYL CITRATE (PF) 100 MCG/2ML IJ SOLN
INTRAMUSCULAR | Status: DC | PRN
  Administered 2023-07-01 (×2): 50 ug via INTRAVENOUS

## 2023-07-01 MED ORDER — ACETAMINOPHEN 325 MG PO TABS: 650.0000 mg | ORAL_TABLET | ORAL | Status: DC | PRN

## 2023-07-01 MED ORDER — EPHEDRINE SULFATE-NACL 50-0.9 MG/10ML-% IV SOSY
PREFILLED_SYRINGE | INTRAVENOUS | Status: DC | PRN
  Administered 2023-07-01 (×2): 10 mg via INTRAVENOUS
  Administered 2023-07-01: 5 mg via INTRAVENOUS

## 2023-07-01 MED ORDER — SENNA 8.6 MG PO TABS
ORAL_TABLET | ORAL | Status: AC
Start: 2023-07-01 — End: ?
  Filled 2023-07-01: qty 1

## 2023-07-01 MED ORDER — MENTHOL 3 MG MT LOZG: 1.0000 | LOZENGE | OROMUCOSAL | Status: DC | PRN

## 2023-07-01 MED ORDER — PHENYLEPHRINE HCL-NACL 20-0.9 MG/250ML-% IV SOLN
INTRAVENOUS | Status: DC | PRN
  Administered 2023-07-01: 20 ug/min via INTRAVENOUS

## 2023-07-01 MED ORDER — ONDANSETRON HCL 4 MG/2ML IJ SOLN
INTRAMUSCULAR | Status: AC
Start: 2023-07-01 — End: ?
  Filled 2023-07-01: qty 2

## 2023-07-01 MED ORDER — SERTRALINE HCL 50 MG PO TABS
ORAL_TABLET | ORAL | Status: AC
  Filled 2023-07-01: qty 1

## 2023-07-01 MED ORDER — OXYCODONE HCL 5 MG PO TABS: 5.0000 mg | ORAL_TABLET | Freq: Once | ORAL | Status: DC | PRN

## 2023-07-01 MED ORDER — OXYCODONE HCL 5 MG PO TABS
10.0000 mg | ORAL_TABLET | ORAL | Status: DC | PRN
Start: 2023-07-01 — End: 2023-07-02
  Administered 2023-07-02 (×2): 10 mg via ORAL

## 2023-07-01 MED ORDER — SPIRONOLACTONE 12.5 MG HALF TABLET
12.5000 mg | ORAL_TABLET | Freq: Every day | ORAL | Status: DC
  Administered 2023-07-02: 12.5 mg via ORAL
  Filled 2023-07-01: qty 1

## 2023-07-01 MED ORDER — HYDROMORPHONE HCL 1 MG/ML IJ SOLN
0.5000 mg | INTRAMUSCULAR | Status: DC | PRN
  Administered 2023-07-01: 0.5 mg via INTRAVENOUS

## 2023-07-01 MED ORDER — ATORVASTATIN CALCIUM 20 MG PO TABS
40.0000 mg | ORAL_TABLET | Freq: Every day | ORAL | Status: DC
  Administered 2023-07-02: 40 mg via ORAL

## 2023-07-01 MED ORDER — CEFAZOLIN SODIUM-DEXTROSE 2-4 GM/100ML-% IV SOLN
INTRAVENOUS | Status: AC
  Filled 2023-07-01: qty 100

## 2023-07-01 MED ORDER — ORAL CARE MOUTH RINSE: 15.0000 mL | Freq: Once | OROMUCOSAL | Status: AC

## 2023-07-01 MED ORDER — FENTANYL CITRATE (PF) 100 MCG/2ML IJ SOLN
INTRAMUSCULAR | Status: AC
  Filled 2023-07-01: qty 2

## 2023-07-01 MED ORDER — ACETAMINOPHEN 10 MG/ML IV SOLN: 1000.0000 mg | Freq: Once | INTRAVENOUS | Status: DC | PRN

## 2023-07-01 MED ORDER — INSULIN ASPART 100 UNIT/ML IJ SOLN
0.0000 [IU] | Freq: Three times a day (TID) | INTRAMUSCULAR | Status: DC
  Administered 2023-07-02: 3 [IU] via SUBCUTANEOUS
  Administered 2023-07-02: 2 [IU] via SUBCUTANEOUS

## 2023-07-01 MED ORDER — RIFAXIMIN 550 MG PO TABS
550.0000 mg | ORAL_TABLET | Freq: Two times a day (BID) | ORAL | Status: DC
  Administered 2023-07-01 – 2023-07-02 (×2): 550 mg via ORAL
  Filled 2023-07-01 (×2): qty 1

## 2023-07-01 MED ORDER — INSULIN ASPART 100 UNIT/ML IJ SOLN: 0.0000 [IU] | Freq: Every day | INTRAMUSCULAR | Status: DC

## 2023-07-01 MED ORDER — DEXAMETHASONE SODIUM PHOSPHATE 10 MG/ML IJ SOLN
INTRAMUSCULAR | Status: AC
  Filled 2023-07-01: qty 1

## 2023-07-01 MED ORDER — METHOCARBAMOL 1000 MG/10ML IJ SOLN: 500.0000 mg | Freq: Four times a day (QID) | INTRAMUSCULAR | Status: DC | PRN

## 2023-07-01 MED ORDER — ACETAMINOPHEN 650 MG RE SUPP: 650.0000 mg | RECTAL | Status: DC | PRN

## 2023-07-01 MED ORDER — PHENOL 1.4 % MT LIQD
1.0000 | OROMUCOSAL | Status: DC | PRN
  Administered 2023-07-02: 1 via OROMUCOSAL
  Filled 2023-07-01: qty 177

## 2023-07-01 MED ORDER — SORBITOL 70 % SOLN: 30.0000 mL | Freq: Every day | Status: DC | PRN

## 2023-07-01 MED ORDER — MAGNESIUM CITRATE PO SOLN: 1.0000 | Freq: Once | ORAL | Status: DC | PRN

## 2023-07-01 MED ORDER — BUPIVACAINE-EPINEPHRINE (PF) 0.5% -1:200000 IJ SOLN
INTRAMUSCULAR | Status: DC | PRN
  Administered 2023-07-01: 10 mL

## 2023-07-01 MED ORDER — EPINEPHRINE PF 1 MG/ML IJ SOLN
INTRAMUSCULAR | Status: DC | PRN
  Administered 2023-07-01 (×3): .01 mg via INTRAVENOUS

## 2023-07-01 MED ORDER — ADULT MULTIVITAMIN W/MINERALS CH
1.0000 | ORAL_TABLET | Freq: Every day | ORAL | Status: DC
  Administered 2023-07-02: 1 via ORAL

## 2023-07-01 MED ORDER — EMPAGLIFLOZIN 10 MG PO TABS
10.0000 mg | ORAL_TABLET | Freq: Every day | ORAL | Status: DC
  Administered 2023-07-02: 10 mg via ORAL
  Filled 2023-07-01 (×2): qty 1

## 2023-07-01 MED ORDER — CEFAZOLIN IN SODIUM CHLORIDE 2-0.9 GM/100ML-% IV SOLN
2.0000 g | Freq: Once | INTRAVENOUS | Status: AC
  Administered 2023-07-01: 2 g via INTRAVENOUS

## 2023-07-01 MED ORDER — OXYCODONE HCL 5 MG PO TABS: 5.0000 mg | ORAL_TABLET | ORAL | Status: DC | PRN

## 2023-07-01 MED ORDER — LIDOCAINE HCL (PF) 2 % IJ SOLN
INTRAMUSCULAR | Status: DC | PRN
  Administered 2023-07-01: 100 mg via INTRADERMAL

## 2023-07-01 MED ORDER — ACETAMINOPHEN 10 MG/ML IV SOLN
INTRAVENOUS | Status: AC
  Filled 2023-07-01: qty 100

## 2023-07-01 MED ORDER — FENTANYL CITRATE (PF) 100 MCG/2ML IJ SOLN: 25.0000 ug | INTRAMUSCULAR | Status: DC | PRN

## 2023-07-01 MED ORDER — ACETAMINOPHEN 500 MG PO TABS: 1000.0000 mg | ORAL_TABLET | Freq: Four times a day (QID) | ORAL | Status: DC

## 2023-07-01 MED ORDER — SURGIFLO WITH THROMBIN (HEMOSTATIC MATRIX KIT) OPTIME
TOPICAL | Status: DC | PRN
  Administered 2023-07-01 (×2): 1 via TOPICAL

## 2023-07-01 MED ORDER — REMIFENTANIL HCL 1 MG IV SOLR
INTRAVENOUS | Status: AC
  Filled 2023-07-01: qty 1000

## 2023-07-01 MED ORDER — SENNA 8.6 MG PO TABS
1.0000 | ORAL_TABLET | Freq: Two times a day (BID) | ORAL | Status: DC
  Administered 2023-07-01 – 2023-07-02 (×2): 8.6 mg via ORAL

## 2023-07-01 MED ORDER — ONDANSETRON HCL 4 MG PO TABS: 4.0000 mg | ORAL_TABLET | Freq: Four times a day (QID) | ORAL | Status: DC | PRN

## 2023-07-01 MED ORDER — LOSARTAN POTASSIUM 50 MG PO TABS
ORAL_TABLET | ORAL | Status: AC
  Filled 2023-07-01: qty 1

## 2023-07-01 MED ORDER — PANTOPRAZOLE SODIUM 40 MG PO TBEC
40.0000 mg | DELAYED_RELEASE_TABLET | Freq: Every day | ORAL | Status: DC
  Administered 2023-07-02: 40 mg via ORAL

## 2023-07-01 MED ORDER — CITALOPRAM HYDROBROMIDE 20 MG PO TABS
20.0000 mg | ORAL_TABLET | Freq: Every day | ORAL | Status: DC
  Administered 2023-07-02: 20 mg via ORAL
  Filled 2023-07-01: qty 1

## 2023-07-01 MED ORDER — SODIUM CHLORIDE 0.9 % IV SOLN: INTRAVENOUS | Status: DC

## 2023-07-01 SURGICAL SUPPLY — 50 items
ADH SKN CLS APL DERMABOND .7 (GAUZE/BANDAGES/DRESSINGS) ×1
AGENT HMST KT MTR STRL THRMB (HEMOSTASIS) ×1
BASIN KIT SINGLE STR (MISCELLANEOUS) ×1 IMPLANT
BULB RESERV EVAC DRAIN JP 100C (MISCELLANEOUS) IMPLANT
BUR NEURO DRILL SOFT 3.0X3.8M (BURR) ×1 IMPLANT
DERMABOND ADVANCED .7 DNX12 (GAUZE/BANDAGES/DRESSINGS) ×1 IMPLANT
DRAIN CHANNEL JP 10F RND 20C F (MISCELLANEOUS) IMPLANT
DRAPE C ARM PK CFD 31 SPINE (DRAPES) ×1 IMPLANT
DRAPE LAPAROTOMY 77X122 PED (DRAPES) ×1 IMPLANT
DRAPE MICROSCOPE SPINE 48X150 (DRAPES) ×1 IMPLANT
ELECT REM PT RETURN 9FT ADLT (ELECTROSURGICAL) ×1
ELECTRODE REM PT RTRN 9FT ADLT (ELECTROSURGICAL) ×1 IMPLANT
FEE INTRAOP CADWELL SUPPLY NCS (MISCELLANEOUS) IMPLANT
FEE INTRAOP MONITOR IMPULS NCS (MISCELLANEOUS) IMPLANT
GLOVE BIOGEL PI IND STRL 6.5 (GLOVE) ×1 IMPLANT
GLOVE SURG SYN 6.5 ES PF (GLOVE) ×1 IMPLANT
GLOVE SURG SYN 6.5 PF PI (GLOVE) ×1 IMPLANT
GLOVE SURG SYN 8.5 E (GLOVE) ×3 IMPLANT
GLOVE SURG SYN 8.5 PF PI (GLOVE) ×3 IMPLANT
GOWN SRG LRG LVL 4 IMPRV REINF (GOWNS) ×1 IMPLANT
GOWN SRG XL LVL 3 NONREINFORCE (GOWNS) ×1 IMPLANT
GOWN STRL NON-REIN TWL XL LVL3 (GOWNS) ×1
GOWN STRL REIN LRG LVL4 (GOWNS) ×1
INTRAOP CADWELL SUPPLY FEE NCS (MISCELLANEOUS) ×1
INTRAOP MONITOR FEE IMPULS NCS (MISCELLANEOUS) ×1
KIT TURNOVER KIT A (KITS) ×1 IMPLANT
MANIFOLD NEPTUNE II (INSTRUMENTS) ×1 IMPLANT
NDL SAFETY ECLIPSE 18X1.5 (NEEDLE) ×1 IMPLANT
NS IRRIG 1000ML POUR BTL (IV SOLUTION) ×1 IMPLANT
PACK LAMINECTOMY ARMC (PACKS) ×1 IMPLANT
PAD ARMBOARD 7.5X6 YLW CONV (MISCELLANEOUS) ×2 IMPLANT
PIN CASPAR 14 (PIN) ×1 IMPLANT
PIN CASPAR 14MM (PIN) ×1
PLATE ACP 1.9X60 3LVL (Plate) IMPLANT
SCREW ACP 3.5X17 S/D VARIA (Screw) IMPLANT
SCREW ACP VA ST 4.0 X 17 (Screw) IMPLANT
SPACER CERV FRGE 12X14X6-0 (Spacer) IMPLANT
SPACER CERVICAL FRGE 12X14X6-7 (Spacer) IMPLANT
SPACER CERVICAL FRGE 12X14X7-7 (Spacer) IMPLANT
SPONGE KITTNER 5P (MISCELLANEOUS) ×1 IMPLANT
STAPLER SKIN PROX 35W (STAPLE) IMPLANT
SURGIFLO W/THROMBIN 8M KIT (HEMOSTASIS) ×1 IMPLANT
SUT ETHILON 3-0 FS-10 30 BLK (SUTURE) ×1
SUT STRATA 3-0 15 PS-2 (SUTURE) IMPLANT
SUT VICRYL 3-0 CR8 SH (SUTURE) ×1 IMPLANT
SUTURE EHLN 3-0 FS-10 30 BLK (SUTURE) IMPLANT
SYR 20ML LL LF (SYRINGE) ×1 IMPLANT
TAPE CLOTH 3X10 WHT NS LF (GAUZE/BANDAGES/DRESSINGS) ×3 IMPLANT
TRAP FLUID SMOKE EVACUATOR (MISCELLANEOUS) ×1 IMPLANT
WATER STERILE IRR 1000ML POUR (IV SOLUTION) ×2 IMPLANT

## 2023-07-01 NOTE — Anesthesia Postprocedure Evaluation (Signed)
Anesthesia Post Note  Patient: [name]  Procedure(s) Performed: ANTERIOR CERVICAL DISCECTOMY AND FUSION (FORGE)  Patient location during evaluation: PACU Anesthesia Type: General Level of consciousness: awake and alert, oriented and patient cooperative Pain management: pain level controlled Vital Signs Assessment: post-procedure vital signs reviewed and stable Respiratory status: spontaneous breathing, nonlabored ventilation and respiratory function stable Cardiovascular status: blood pressure returned to baseline and stable Postop Assessment: adequate PO intake Anesthetic complications: no   No notable events documented.   Last Vitals:  Vitals:   07/01/23 1800 07/01/23 1815  BP: (!) 161/90 (!) 163/77  Pulse: 66 (!) 59  Resp: 17 11  Temp:    SpO2: 99% 100%    Last Pain:  Vitals:   07/01/23 1815  TempSrc:   PainSc: 0-No pain                 Reed Breech

## 2023-07-01 NOTE — Progress Notes (Signed)
Notified MD of CBG 76. Additional 1/2 amp D50 ordered.

## 2023-07-01 NOTE — Progress Notes (Signed)
Notified MD of CBG 70. Order received to administer D10 IV fluids.

## 2023-07-01 NOTE — Transfer of Care (Signed)
Immediate Anesthesia Transfer of Care Note  Patient: [name]  Procedure(s) Performed: ANTERIOR CERVICAL DISCECTOMY AND FUSION (FORGE)  Patient Location: PACU  Anesthesia Type:General  Level of Consciousness: drowsy and patient cooperative  Airway & Oxygen Therapy: Patient Spontanous Breathing and Patient connected to face mask oxygen  Post-op Assessment: Report given to RN and Post -op Vital signs reviewed and stable  Post vital signs: Reviewed and stable  Last Vitals:  Vitals Value Taken Time  BP 140/78 07/01/23 1745  Temp 36.2 C 07/01/23 1739  Pulse 64 07/01/23 1745  Resp 18 07/01/23 1745  SpO2 100 % 07/01/23 1745  Vitals shown include unfiled device data.  Last Pain:  Vitals:   07/01/23 1028  TempSrc: Temporal  PainSc: 0-No pain         Complications: No notable events documented.

## 2023-07-01 NOTE — Anesthesia Preprocedure Evaluation (Addendum)
Anesthesia Evaluation  Patient identified by MRN, date of birth, ID band Patient awake    Reviewed: Allergy & Precautions, NPO status , Patient's Chart, lab work & pertinent test results  History of Anesthesia Complications Negative for: history of anesthetic complications  Airway Mallampati: III  TM Distance: >3 FB Neck ROM: full    Dental  (+) Poor Dentition   Pulmonary asthma , sleep apnea , COPD (rare inhaler use), former smoker   Pulmonary exam normal        Cardiovascular Exercise Tolerance: Good hypertension, On Medications pulmonary hypertension+ CAD, + Peripheral Vascular Disease (BILATERAL carotid artery disease) and +CHF  + dysrhythmias (bradycardia)  Rhythm:Regular Rate:Bradycardia + Systolic murmurs  Most recent myocardial perfusion imaging study was performed on 10/04/2021 revealing a normal left ventricular systolic function with an EF of 58%.  There was no evidence of stress-induced myocardial ischemia or arrhythmia; no scintigraphic evidence of scar.  CT attenuation correction imaging revealed calcifications in the LAD.  Study determined to be low risk overall.    Most recent TTE was performed on 12/24/2022 revealing a normal left ventricular systolic function with an EF of 60 to 65%.  There were no regional wall motion abnormalities.  There was mild LVH.  Right ventricular size and function normal.  There was mild left atrial dilatation. All transvalvular gradients were noted to be normal providing no evidence suggestive of valvular stenosis. Aorta normal in size with no evidence of aneurysmal dilatation.     Neuro/Psych  PSYCHIATRIC DISORDERS  Depression    depression, suicidal ideations, malingering, PTSD, homelessness with recent placement in group homefrequent falls Cervical myelopathy TIA (pt does not recall this event)   GI/Hepatic ,GERD  ,,(+) Cirrhosis  (portal HTN)  ascites      Endo/Other   diabetes, Well Controlled, Type 2    Renal/GU      Musculoskeletal   Abdominal  (+) + obese  Peds  Hematology  (+) Blood dyscrasia, anemia Thrombocytopenia     Anesthesia Other Findings Per cardiology, "patient's RCRI score is 6.6%. The patient affirms he has been doing well without any new cardiac symptoms. They are able to achieve 6 METS without cardiac limitations. Therefore, based on ACC/AHA guidelines, the patient would be at acceptable risk for the planned procedure without further cardiovascular testing".  Past Medical History: No date: Asthma No date: CHF (congestive heart failure) (HCC) No date: COPD (chronic obstructive pulmonary disease) (HCC) 03/2020: COVID-19     Comment:  diagnosed in August 2021 No date: Diabetes mellitus without complication (HCC) No date: Homelessness No date: Hypertension No date: Infestation by bed bug No date: Migraine No date: Obesity No date: Sleep apnea  Past Surgical History: No date: CARDIAC CATHETERIZATION No date: CHOLECYSTECTOMY No date: EYE SURGERY No date: INNER EAR SURGERY No date: NOSE SURGERY  BMI    Body Mass Index: 32.74 kg/m      Reproductive/Obstetrics negative OB ROS                              Anesthesia Physical Anesthesia Plan  ASA: 3  Anesthesia Plan: General ETT   Post-op Pain Management: Regional block* and Toradol IV (intra-op)*   Induction: Intravenous  PONV Risk Score and Plan: 2 and Ondansetron, Dexamethasone and Treatment may vary due to age or medical condition  Airway Management Planned: Oral ETT  Additional Equipment:   Intra-op Plan:   Post-operative Plan: Extubation in OR  Informed  Consent: I have reviewed the patients History and Physical, chart, labs and discussed the procedure including the risks, benefits and alternatives for the proposed anesthesia with the patient or authorized representative who has indicated his/her understanding and acceptance.      Dental Advisory Given  Plan Discussed with: Anesthesiologist, CRNA and Surgeon  Anesthesia Plan Comments:          Anesthesia Quick Evaluation

## 2023-07-01 NOTE — Progress Notes (Signed)
Notified MD of CBG of 66. 1/2 amp D50 ordered.

## 2023-07-01 NOTE — Anesthesia Procedure Notes (Signed)
Procedure Name: Intubation Date/Time: 07/01/2023 2:35 PM  Performed by: Reece Agar, CRNAPre-anesthesia Checklist: Patient identified, Emergency Drugs available, Suction available and Patient being monitored Patient Re-evaluated:Patient Re-evaluated prior to induction Oxygen Delivery Method: Circle system utilized Preoxygenation: Pre-oxygenation with 100% oxygen Induction Type: IV induction Ventilation: Mask ventilation without difficulty Laryngoscope Size: McGraph and 3 Grade View: Grade I Tube type: Oral Tube size: 7.0 mm Number of attempts: 1 Airway Equipment and Method: Stylet and Oral airway Placement Confirmation: ETT inserted through vocal cords under direct vision, positive ETCO2 and breath sounds checked- equal and bilateral Secured at: 21 cm Tube secured with: Tape Dental Injury: Teeth and Oropharynx as per pre-operative assessment

## 2023-07-01 NOTE — Interval H&P Note (Signed)
History and Physical Interval Note:  07/01/2023 11:46 AM  [name]  has presented today for surgery, with the diagnosis of G95.9 Cervical myelopathy M48.02 Cervical stenosis of spinal canal.  The various methods of treatment have been discussed with the patient and family. After consideration of risks, benefits and other options for treatment, the patient has consented to  Procedure(s) with comments: ANTERIOR CERVICAL DISCECTOMY AND FUSION (FORGE) (N/A) - Keep as last case of the day per Dr Myer Haff as a surgical intervention.  The patient's history has been reviewed, patient examined, no change in status, stable for surgery.  I have reviewed the patient's chart and labs.  Questions were answered to the patient's satisfaction.    Heart sounds normal no MRG. Chest Clear to Auscultation Bilaterally.   Annie Saephan

## 2023-07-01 NOTE — Discharge Instructions (Signed)
Your surgeon has performed an operation on your cervical spine (neck) to relieve pressure on the spinal cord and/or nerves. This involved making an incision in the front of your neck and removing one or more of the discs that support your spine. Next, a small piece of bone, a titanium plate, and screws were used to fuse two or more of the vertebrae (bones) together.  The following are instructions to help in your recovery once you have been discharged from the hospital. Even if you feel well, it is important that you follow these activity guidelines. If you do not let your neck heal properly from the surgery, you can increase the chance of return of your symptoms and other complications.  * Do not take anti-inflammatory medications for 3 months after surgery (naproxen [Aleve], ibuprofen [Advil, Motrin], etc.). These medications can prevent your bones from healing properly.  Celebrex, if prescribed, is ok to take.  Activity    No bending, lifting, or twisting ("BLT"). Avoid lifting objects heavier than 10 pounds (gallon milk jug).  Where possible, avoid household activities that involve lifting, bending, reaching, pushing, or pulling such as laundry, vacuuming, grocery shopping, and childcare. Try to arrange for help from friends and family for these activities while your back heals.  Increase physical activity slowly as tolerated.  Taking short walks is encouraged, but avoid strenuous exercise. Do not jog, run, bicycle, lift weights, or participate in any other exercises unless specifically allowed by your doctor.  Talk to your doctor before resuming sexual activity.  You should not drive until cleared by your doctor.  Until released by your doctor, you should not return to work or school.  You should rest at home and let your body heal.   You may shower three days after your surgery.  After showering, lightly dab your incision dry. Do not take a tub bath or go swimming until approved by your  doctor at your follow-up appointment.  If your doctor ordered a cervical collar (neck brace) for you, you should wear it whenever you are out of bed. You may remove it when lying down or sleeping, but you should wear it at all other times. Not all neck surgeries require a cervical collar.  If you smoke, we strongly recommend that you quit.  Smoking has been proven to interfere with normal bone healing and will dramatically reduce the success rate of your surgery. Please contact [facility] (800-QUIT-NOW) and use the resources at www.[facility].com for assistance in stopping smoking.  Surgical Incision   If you have a dressing on your incision, you may remove it two days after your surgery. Keep your incision area clean and dry.  If you have staples or stitches on your incision, you should have a follow up scheduled for removal. If you do not have staples or stitches, you will have steri-strips (small pieces of surgical tape) or Dermabond glue. The steri-strips/glue should begin to peel away within about a week (it is fine if the steri-strips fall off before then). If the strips are still in place one week after your surgery, you may gently remove them.  Diet           You may return to your usual diet. However, you may experience discomfort when swallowing in the first month after your surgery. This is normal. You may find that softer foods are more comfortable for you to swallow. Be sure to stay hydrated.  When to Contact Us  You may experience pain in your   neck and/or pain between your shoulder blades. This is normal and should improve in the next few weeks with the help of pain medication, muscle relaxers, and rest. Some patients report that a warm compress on the back of the neck or between the shoulder blades helps.  However, should you experience any of the following, contact us immediately: New numbness or weakness Pain that is progressively getting worse, and is not relieved by your pain  medication, muscle relaxers, rest, and warm compresses Bleeding, redness, swelling, pain, or drainage from surgical incision Chills or flu-like symptoms Fever greater than 101.0 F (38.3 C) Inability to eat, drink fluids, or take medications Problems with bowel or bladder functions Difficulty breathing or shortness of breath Warmth, tenderness, or swelling in your calf Contact Information How to contact us:  If you have any questions/concerns before or after surgery, you can reach [place] at [phone], or you can send a mychart message. We can be reached by phone or mychart 8am-4pm, Monday-Friday.  *Please note: Calls after 4pm are forwarded to a third party answering service. Mychart messages are not routinely monitored during evenings, weekends, and holidays. Please call our office to contact the answering service for urgent concerns during non-business hours.

## 2023-07-01 NOTE — Op Note (Signed)
Indications: Mr. Placido Hangartner is a 72 y.o. male with G95.9 Cervical myelopathy, M48.02 Cervical stenosis of spinal canal. He has overt weakness prompting surgical intervention  Findings: stenosis, successful decompression  Preoperative Diagnosis: G95.9 Cervical myelopathy, M48.02 Cervical stenosis of spinal canal  Postoperative Diagnosis: same   EBL: 100 ml IVF: see anesthesia record Drains: One Disposition: Extubated and Stable to PACU Complications: none  No foley catheter was placed.   Preoperative Note:    Risks of surgery discussed include: infection, bleeding, stroke, coma, death, paralysis, CSF leak, nerve/spinal cord injury, numbness, tingling, weakness, complex regional pain syndrome, recurrent stenosis and/or disc herniation, vascular injury, development of instability, neck/back pain, need for further surgery, persistent symptoms, development of deformity, and the risks of anesthesia. The patient understood these risks and agreed to proceed.  Operative Procedure: 1. Anterior Cervical Discectomy and Fusion C4-5 including bilateral foraminotomies and end plate preparation  2. Anterior Cervical Discectomy and Fusion C5-6 including bilateral foraminotomies and end plate preparation  3. Anterior Cervical Discectomy and Fusion C3-4 including bilateral foraminotomies and end plate preparation 4. Anterior Spinal Instrumentation C3 to 6 using Nuvasive ACP 5. Anterior arthrodesis from C3 to C6 6. Use of the operative microscope 7. Use of intraoperative flouroscopy  PROCEDURE IN DETAIL: After obtaining informed consent, the patient taken to the operating room, placed in supine position, general anesthesia induced.  The patient had a small shoulder roll placed behind their shoulders.  The patient received preop antibiotics and IV Decadron.  The patient had a neck incision outlined, was prepped and draped in usual sterile fashion. The incision was injected with local anesthetic.   An  incision was opened, dissection taken down medial to the carotid artery and jugular vein, lateral to the trachea and esophagus.  The prevertebral fascia identified and a localizing x-ray demonstrated the correct level.  The longus colli were dissected laterally, and self-retaining retractors placed to open the operative field. The microscope was then brought into the field.  With this complete, distractor pins were placed in the vertebral bodies of C3 and C4. The distractor was placed from C3-4, and the annulus at C3-4 was opened using a bovie.  Curettes and pituitary rongeurs used to remove the majority of disk, then the drill was used to remove the posterior osteophyte and begin the foraminotomies. The nerve hook was used to elevate the posterior longitudinal ligament, which was then removed with Kerrison rongeurs. The microblunt nerve hook could be passed out the foramen bilaterally.   Meticulous hemostasis wasobtained.  Structural allograft was tapped behind the anterior lip of the vertebral body at C3-4 (7 mm).  The distractor was then removed, and the C3 caspar pin removed. Bone wax was used for hemostasis. An additional Caspar pin was placed at C6. The distractor was placed from C4 to C6. The annuli at C4-5 and C5-6 were opened with a bovie. Curettes and pituitary rongeurs used to remove the majority of disk at each level, then the drill was used to remove the posterior osteophyte and begin the foraminotomies. The nerve hook was used to elevate the posterior longitudinal ligament, which was then removed with Kerrison rongeurs. The microblunt nerve hook could be passed out the foramen bilaterally at each level.   Meticulous hemostasis was obtained. After hemostasis, structural allograft was tapped behind the anterior lip of the vertebral body at C4-5 (6 mm) and C5-6 (6 mm).    The caspar distractor was removed, and bone wax used for hemostasis at each level. The anterior  osteophytes were removed.   A  separate, four segment, three level plate (60 mm Nuvasive ACP) was chosen.  Two screws placed in the vertebral bodies of all four segments, respectively making sure the screws were behind the locking mechanism.  Final AP and lateral radiographs were taken.   Please note that the plate is not inclusive to the interbody structural allograft.  The anchoring mechanism of the plate is completely separate from the allograft.  With everything in good position, the wound was irrigated copiously with bacitracin-containing solution and meticulous hemostasis obtained.  Wound was closed in 2 layers using interrupted inverted 3-0 Vicryl sutures in the platysma and 3-0 monocryl in the dermis.  The wound was dressed with dermabond, the head of bed at 30 degrees, taken to recovery room in stable condition.  No new postop neurological deficits were identified.  All counts were correct at the end of the case. Monitoring was used throughout without any changes.  Manning Charity PA assisted in the entire procedure. An assistant was required for this procedure due to the complexity.  The assistant provided assistance in tissue manipulation and suction, and was required for the successful and safe performance of the procedure. I performed the critical portions of the procedure.   Venetia Night MD

## 2023-07-02 ENCOUNTER — Ambulatory Visit: Payer: 59 | Admitting: Neurosurgery

## 2023-07-02 LAB — CBC
HCT: 32.2 % — ABNORMAL LOW (ref 39.0–52.0)
HCT: 32.5 % — ABNORMAL LOW (ref 39.0–52.0)
Hemoglobin: 11 g/dL — ABNORMAL LOW (ref 13.0–17.0)
Hemoglobin: 11.2 g/dL — ABNORMAL LOW (ref 13.0–17.0)
MCH: 31.5 pg (ref 26.0–34.0)
MCH: 31.4 pg (ref 26.0–34.0)
MCHC: 34.2 g/dL (ref 30.0–36.0)
MCHC: 34.5 g/dL (ref 30.0–36.0)
MCV: 92.3 fL (ref 80.0–100.0)
MCV: 91 fL (ref 80.0–100.0)
Platelets: 56 10*3/uL — ABNORMAL LOW (ref 150–400)
Platelets: 77 10*3/uL — ABNORMAL LOW (ref 150–400)
RBC: 3.49 MIL/uL — ABNORMAL LOW (ref 4.22–5.81)
RBC: 3.57 MIL/uL — ABNORMAL LOW (ref 4.22–5.81)
RDW: 16.7 % — ABNORMAL HIGH (ref 11.5–15.5)
RDW: 16.4 % — ABNORMAL HIGH (ref 11.5–15.5)
WBC: 2.9 10*3/uL — ABNORMAL LOW (ref 4.0–10.5)
WBC: 7.7 10*3/uL (ref 4.0–10.5)
nRBC: 0 % (ref 0.0–0.2)
nRBC: 0 % (ref 0.0–0.2)

## 2023-07-02 LAB — GLUCOSE, CAPILLARY
Glucose-Capillary: 144 mg/dL — ABNORMAL HIGH (ref 70–99)
Glucose-Capillary: 166 mg/dL — ABNORMAL HIGH (ref 70–99)

## 2023-07-02 MED ORDER — SENNA 8.6 MG PO TABS
ORAL_TABLET | ORAL | Status: AC
Start: 2023-07-02 — End: ?
  Filled 2023-07-02: qty 1

## 2023-07-02 MED ORDER — BENZOCAINE 10 % MT GEL
Freq: Three times a day (TID) | OROMUCOSAL | Status: DC | PRN
  Filled 2023-07-02: qty 9

## 2023-07-02 MED ORDER — INSULIN ASPART 100 UNIT/ML IJ SOLN
INTRAMUSCULAR | Status: AC
Start: 2023-07-02 — End: ?
  Filled 2023-07-02: qty 1

## 2023-07-02 MED ORDER — METHOCARBAMOL 500 MG PO TABS: 500.0000 mg | ORAL_TABLET | Freq: Four times a day (QID) | ORAL | 0 refills | Status: DC | PRN

## 2023-07-02 MED ORDER — ATORVASTATIN CALCIUM 20 MG PO TABS
ORAL_TABLET | ORAL | Status: AC
  Filled 2023-07-02: qty 2

## 2023-07-02 MED ORDER — OXYCODONE HCL 5 MG PO TABS
ORAL_TABLET | ORAL | Status: AC
  Filled 2023-07-02: qty 2

## 2023-07-02 MED ORDER — ADULT MULTIVITAMIN W/MINERALS CH
ORAL_TABLET | ORAL | Status: AC
Start: 2023-07-02 — End: ?
  Filled 2023-07-02: qty 1

## 2023-07-02 MED ORDER — ACETAMINOPHEN 500 MG PO TABS
ORAL_TABLET | ORAL | Status: AC
  Filled 2023-07-02: qty 2

## 2023-07-02 MED ORDER — ENOXAPARIN SODIUM 40 MG/0.4ML IJ SOSY
PREFILLED_SYRINGE | INTRAMUSCULAR | Status: AC
Start: 2023-07-02 — End: ?
  Filled 2023-07-02: qty 0.4

## 2023-07-02 MED ORDER — SENNA 8.6 MG PO TABS: 1.0000 | ORAL_TABLET | Freq: Two times a day (BID) | ORAL | 0 refills | Status: AC | PRN

## 2023-07-02 MED ORDER — LOSARTAN POTASSIUM 50 MG PO TABS
ORAL_TABLET | ORAL | Status: AC
  Filled 2023-07-02: qty 1

## 2023-07-02 MED ORDER — METHOCARBAMOL 500 MG PO TABS
ORAL_TABLET | ORAL | Status: AC
  Filled 2023-07-02: qty 1

## 2023-07-02 MED ORDER — SODIUM CHLORIDE 0.9% IV SOLUTION: Freq: Once | INTRAVENOUS | Status: AC

## 2023-07-02 MED ORDER — DOCUSATE SODIUM 100 MG PO CAPS
ORAL_CAPSULE | ORAL | Status: AC
  Filled 2023-07-02: qty 1

## 2023-07-02 MED ORDER — GABAPENTIN 300 MG PO CAPS
ORAL_CAPSULE | ORAL | Status: AC
Start: 2023-07-02 — End: ?
  Filled 2023-07-02: qty 1

## 2023-07-02 MED ORDER — FUROSEMIDE 20 MG PO TABS
ORAL_TABLET | ORAL | Status: AC
  Filled 2023-07-02: qty 1

## 2023-07-02 MED ORDER — PANTOPRAZOLE SODIUM 40 MG PO TBEC
DELAYED_RELEASE_TABLET | ORAL | Status: AC
Start: 2023-07-02 — End: ?
  Filled 2023-07-02: qty 1

## 2023-07-02 MED ORDER — OXYCODONE HCL 5 MG PO TABS: 5.0000 mg | ORAL_TABLET | ORAL | 0 refills | Status: DC | PRN

## 2023-07-02 NOTE — Progress Notes (Signed)
   Neurosurgery Progress Note  History: [name] is s/p C3-6 ACDF for cervical myelopathy   POD1: expected posterior neck pain this morning and pt reporting some gum pain from his dentures; requesting orajel   Physical Exam: Vitals:   07/02/23 0120 07/02/23 0530  BP: 117/64 129/65  Pulse: (!) 54 60  Resp: 16 16  Temp: 97.8 F (36.6 C) 97.7 F (36.5 C)  SpO2: 97% 98%    AA Ox3 CNI  Strength:5/5 throughout  JP 80mL out since surgery. Some evidence of bleeding around drain site  Data:  Other tests/results:     Latest Ref Rng & Units 07/02/2023    6:18 AM 06/27/2023    9:08 AM 06/21/2023    4:56 AM  CBC  WBC 4.0 - 10.5 K/uL 2.9  2.2  2.0   Hemoglobin 13.0 - 17.0 g/dL 52.8  41.3  24.4   Hematocrit 39.0 - 52.0 % 32.2  35.4  35.0   Platelets 150 - 400 K/uL 56  77  75     Assessment/Plan:  [name] is a 72 y.o presenting with weakness and cervical myelopathy s/p C3-6 ACDF   - mobilize - pain control - DVT prophylaxis - will continue JP drain for now - 1 unit of platelets ordered. Will recheck CBC after completed - PTOT  Manning Charity PA-C Department of Neurosurgery
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
## 2023-07-02 NOTE — Discharge Summary (Signed)
Discharge Summary  Patient ID: [name] MRN: [id number] DOB/AGE: 09/05/1950 72 y.o.  Admit date: 07/01/2023 Discharge date: 07/02/2023  Admission Diagnoses: G95.9 Cervical myelopathy, M48.02 Cervical stenosis of spinal canal   Discharge Diagnoses:  Principal Problem:   Cervical myelopathy (HCC) Active Problems:   Cervical spinal stenosis   Discharged Condition: good  Hospital Course:  [name] is a 72 y.o presenting with cervical myelopathy and cervical stenosis status post C3-6 ACDF.  He was admitted overnight for pain control, therapy evaluation, and drain output monitoring.  His postoperative course was complicated by worsening thrombocytopenia which improved back to his baseline with 1 unit of platelets.  His drain output reduced to an acceptable level and was removed on the afternoon of postop day 1.  His pain was well-controlled on oral medications.  He was seen and evaluated by therapy and deemed appropriate for discharge with resumption of his home health services.  He was given prescriptions for pain medication, muscle relaxer, and stool softener.  Consults: None  Significant Diagnostic Studies:     Latest Ref Rng & Units 07/02/2023   12:51 PM 07/02/2023    6:18 AM 06/27/2023    9:08 AM  CBC  WBC 4.0 - 10.5 K/uL 7.7  2.9  2.2   Hemoglobin 13.0 - 17.0 g/dL 32.4  40.1  02.7   Hematocrit 39.0 - 52.0 % 32.5  32.2  35.4   Platelets 150 - 400 K/uL 77  56  77      Treatments: surgery: As above.  Please see separately dictated operative report for further details.  Patient received 1 unit of platelets on postop day 1.  Discharge Exam: Blood pressure 132/67, pulse (!) 58, temperature (!) 97.3 F (36.3 C), temperature source Temporal, resp. rate 16, height 5\' 6"  (1.676 m), weight 90.4 kg, SpO2 99%. AA Ox3 CNI   Strength:5/5 throughout  Drain site with 2 staples in place.  There is no persistent drainage.  Disposition: Discharge disposition: 01-Home or Self  Care       Discharge Instructions     Incentive spirometry RT   Complete by: As directed       Allergies as of 07/02/2023       Reactions   Bee Venom Anaphylaxis   Codeine Itching   Only itching. Recently allergy tested at [facility]   Lactulose Diarrhea   Patient states causes diarrhea   Novocain [procaine]    Sulfa Antibiotics Other (See Comments)        Medication List     STOP taking these medications    oxyCODONE-acetaminophen 5-325 MG tablet Commonly known as: PERCOCET/ROXICET       TAKE these medications    atorvastatin 40 MG tablet Commonly known as: Lipitor Take 1 tablet (40 mg total) by mouth daily.   citalopram 20 MG tablet Commonly known as: CeleXA Take 1 tablet (20 mg total) by mouth daily.   empagliflozin 10 MG Tabs tablet Commonly known as: Jardiance Take 1 tablet (10 mg total) by mouth daily before breakfast.   furosemide 20 MG tablet Commonly known as: LASIX Take 1 tablet (20 mg total) by mouth daily.   gabapentin 300 MG capsule Commonly known as: NEURONTIN Take 300 mg by mouth 2 (two) times daily.   losartan 50 MG tablet Commonly known as: COZAAR Take 1 tablet (50 mg total) by mouth daily.   methocarbamol 500 MG tablet Commonly known as: ROBAXIN Take 1 tablet (500 mg total) by mouth every 6 (six) hours  as needed for muscle spasms.   multivitamin with minerals Tabs tablet Take 1 tablet by mouth daily.   ondansetron 4 MG tablet Commonly known as: ZOFRAN Take 1 tablet (4 mg total) by mouth every 6 (six) hours as needed for nausea.   oxyCODONE 5 MG immediate release tablet Commonly known as: Oxy IR/ROXICODONE Take 1 tablet (5 mg total) by mouth every 4 (four) hours as needed for moderate pain (pain score 4-6) or severe pain (pain score 7-10) ((score 4 to 6)).   pantoprazole 40 MG tablet Commonly known as: Protonix Take 1 tablet (40 mg total) by mouth daily.   QUEtiapine 50 MG tablet Commonly known as: SEROQUEL Take 1 tablet  (50 mg total) by mouth at bedtime.   senna 8.6 MG Tabs tablet Commonly known as: SENOKOT Take 1 tablet (8.6 mg total) by mouth 2 (two) times daily as needed for mild constipation.   spironolactone 25 MG tablet Commonly known as: ALDACTONE Take 0.5 tablets (12.5 mg total) by mouth daily.   Xifaxan 550 MG Tabs tablet Generic drug: rifaximin Take 1 tablet (550 mg total) by mouth 2 (two) times daily.               Durable Medical Equipment  (From admission, onward)           Start     Ordered   07/02/23 1537  For home use only DME Walker rolling  Once       Question Answer Comment  Walker: With 5 Inch Wheels   Patient needs a walker to treat with the following condition Cervical myelopathy (HCC)      07/02/23 1536            Follow-up Information     Susanne Borders, PA Follow up on 07/16/2023.   Specialty: Neurosurgery Contact information: [address] [place] [address] [phone]                 Signed: Susanne Borders 07/02/2023, 3:47 PM
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
## 2023-07-02 NOTE — Progress Notes (Signed)
Nsg Discharge Note  Admit Date:  07/01/2023 Discharge date: 07/02/2023   [name] to be D/C'd Home per MD order.  AVS completed.   Patient/caregiver able to verbalize understanding.  Discharge Medication: Allergies as of 07/02/2023       Reactions   Bee Venom Anaphylaxis   Codeine Itching   Only itching. Recently allergy tested at [facility]   Lactulose Diarrhea   Patient states causes diarrhea   Novocain [procaine]    Sulfa Antibiotics Other (See Comments)        Medication List     STOP taking these medications    oxyCODONE-acetaminophen 5-325 MG tablet Commonly known as: PERCOCET/ROXICET       TAKE these medications    atorvastatin 40 MG tablet Commonly known as: Lipitor Take 1 tablet (40 mg total) by mouth daily.   citalopram 20 MG tablet Commonly known as: CeleXA Take 1 tablet (20 mg total) by mouth daily.   empagliflozin 10 MG Tabs tablet Commonly known as: Jardiance Take 1 tablet (10 mg total) by mouth daily before breakfast.   furosemide 20 MG tablet Commonly known as: LASIX Take 1 tablet (20 mg total) by mouth daily.   gabapentin 300 MG capsule Commonly known as: NEURONTIN Take 300 mg by mouth 2 (two) times daily.   losartan 50 MG tablet Commonly known as: COZAAR Take 1 tablet (50 mg total) by mouth daily.   methocarbamol 500 MG tablet Commonly known as: ROBAXIN Take 1 tablet (500 mg total) by mouth every 6 (six) hours as needed for muscle spasms.   multivitamin with minerals Tabs tablet Take 1 tablet by mouth daily.   ondansetron 4 MG tablet Commonly known as: ZOFRAN Take 1 tablet (4 mg total) by mouth every 6 (six) hours as needed for nausea.   oxyCODONE 5 MG immediate release tablet Commonly known as: Oxy IR/ROXICODONE Take 1 tablet (5 mg total) by mouth every 4 (four) hours as needed for moderate pain (pain score 4-6) or severe pain (pain score 7-10) ((score 4 to 6)).   pantoprazole 40 MG tablet Commonly known as: Protonix Take 1  tablet (40 mg total) by mouth daily.   QUEtiapine 50 MG tablet Commonly known as: SEROQUEL Take 1 tablet (50 mg total) by mouth at bedtime.   senna 8.6 MG Tabs tablet Commonly known as: SENOKOT Take 1 tablet (8.6 mg total) by mouth 2 (two) times daily as needed for mild constipation.   spironolactone 25 MG tablet Commonly known as: ALDACTONE Take 0.5 tablets (12.5 mg total) by mouth daily.   Xifaxan 550 MG Tabs tablet Generic drug: rifaximin Take 1 tablet (550 mg total) by mouth 2 (two) times daily.               Durable Medical Equipment  (From admission, onward)           Start     Ordered   07/02/23 1537  For home use only DME Walker rolling  Once       Question Answer Comment  Walker: With 5 Inch Wheels   Patient needs a walker to treat with the following condition Cervical myelopathy (HCC)      07/02/23 1536            Discharge Assessment: Vitals:   07/02/23 1129 07/02/23 1555  BP: 132/67 (!) 150/70  Pulse: (!) 58 (!) 59  Resp: 16 17  Temp: (!) 97.3 F (36.3 C) (!) 97.4 F (36.3 C)  SpO2: 99% 100%  Skin clean, dry and intact without evidence of skin break down, no evidence of skin tears noted. IV catheter discontinued intact. Site without signs and symptoms of complications - no redness or edema noted at insertion site, patient denies c/o pain - only slight tenderness at site.  Dressing with slight pressure applied.  D/c Instructions-Education: Discharge instructions given to patient/family with verbalized understanding. D/c education completed with patient/family including follow up instructions, medication list, d/c activities limitations if indicated, with other d/c instructions as indicated by MD - patient able to verbalize understanding, all questions fully answered. Patient instructed to return to ED, call 911, or call MD for any changes in condition.  Patient escorted via WC, and D/C group home via private auto with caregiver.  Theodore Demark, RN 07/02/2023 4:11 PM

## 2023-07-02 NOTE — Plan of Care (Signed)
See chart

## 2023-07-02 NOTE — TOC Transition Note (Signed)
Transition of Care [facility]) - CM/SW Discharge Note   Patient Details  Name: [name] MRN: [id number] Date of Birth: 11-Nov-1950  Transition of Care [facility]) CM/SW Contact:  Allena Katz, LCSW Phone Number: 07/02/2023, 3:47 PM   Clinical Narrative:   Pt has orders to discharge back to group home. [name] notified. Doristine Mango reports pt has a walker at facility he just [facility]. Pt already active with [facility] prior to admission for PT/OT. MD to enter resumption of care orders. Cheryl with [facility] notified.     Final next level of care: Home w Home Health Services Barriers to Discharge: Barriers Resolved   Patient Goals and CMS Choice CMS Medicare.gov Compare Post Acute Care list provided to:: Patient    Discharge Placement                         Discharge Plan and Services Additional resources added to the After Visit Summary for                    DME Agency: [facility]       HH Arranged: PT, OT [facility] Agency: [facility] Date [facility] Agency Contacted: 07/02/23      Social Determinants of Health (SDOH) Interventions SDOH Screenings   Food Insecurity: Food Insecurity Present (07/01/2023)  Housing: Low Risk  (07/01/2023)  Recent Concern: Housing - High Risk (06/18/2023)  Transportation Needs: Unmet Transportation Needs (07/01/2023)  Utilities: At Risk (07/01/2023)  Alcohol Screen: Low Risk  (09/24/2022)  Depression (PHQ2-9): High Risk (04/26/2022)  Financial Resource Strain: High Risk (04/27/2022)  Physical Activity: Sufficiently Active (04/16/2018)  Social Connections: Moderately Isolated (04/30/2018)  Stress: Stress Concern Present (04/16/2018)  Tobacco Use: Medium Risk (07/01/2023)     Readmission Risk Interventions    06/18/2023   12:36 PM 11/07/2022    1:45 PM  Readmission Risk Prevention Plan  Transportation Screening Complete Complete  Medication Review Oceanographer) Complete   PCP or Specialist appointment within 3-5 days  of discharge Complete   HRI or Home Care Consult Complete   SW Recovery Care/Counseling Consult Not Complete Complete  Palliative Care Screening Not Complete Not Applicable  Skilled Nursing Facility Complete Not Applicable

## 2023-07-03 ENCOUNTER — Encounter: Payer: Self-pay | Admitting: Neurosurgery

## 2023-07-03 LAB — BPAM PLATELET PHERESIS
Blood Product Expiration Date: 202411072359
ISSUE DATE / TIME: 202411050944
Unit Type and Rh: 7300

## 2023-07-03 LAB — PREPARE PLATELET PHERESIS: Unit division: 0

## 2023-07-03 LAB — TYPE AND SCREEN
ABO/RH(D): AB NEG
Antibody Screen: NEGATIVE

## 2023-07-04 ENCOUNTER — Telehealth: Payer: Self-pay | Admitting: Licensed Clinical Social Worker

## 2023-07-04 NOTE — Telephone Encounter (Signed)
H&V Care Navigation CSW Progress Note  Clinical Social Worker  called pt listed preferred number [phone]  to f/u on request for transportation to HF clinic appt on 11/12. Hadley Pen at that number- [facility] administrator. He confirms pt resides at their care home now. He denies pt needs ride to appt, states they would handle it. Took down date, time, and location of appt. Pt will need a new DPR on file since as a Family Care Home they would manage his medications and basic needs. LCSW made note on appt. No additional questions from Warren.   Patient is participating in a Managed Medicaid Plan:  No, [employer] Dual Complete  SDOH Screenings   Food Insecurity: Food Insecurity Present (07/01/2023)  Housing: Low Risk  (07/01/2023)  Recent Concern: Housing - High Risk (06/18/2023)  Transportation Needs: Unmet Transportation Needs (07/01/2023)  Utilities: At Risk (07/01/2023)  Alcohol Screen: Low Risk  (09/24/2022)  Depression (PHQ2-9): High Risk (04/26/2022)  Financial Resource Strain: High Risk (04/27/2022)  Physical Activity: Sufficiently Active (04/16/2018)  Social Connections: Moderately Isolated (04/30/2018)  Stress: Stress Concern Present (04/16/2018)  Tobacco Use: Medium Risk (07/01/2023)    Octavio Graves, MSW, LCSW Clinical Social Worker II [facility] Heart/Vascular Care Navigation  [phone]- work cell phone (preferred) [phone]- desk phone

## 2023-07-08 NOTE — Progress Notes (Unsigned)
PCP: [facility] Cardiologist: Julien Nordmann, MD (last seen 02/22)  HPI:  [name] is a 72 y/o male with a history of DM, HTN, COPD, migraines, hepatitis C, w/ likely underlying HCC, cirrhosis, TIA, depression, anxiety obstructive sleep apnea, cervical stenosis status post C3-6 ACDF and chronic heart failure.  Numerous ED visits in previous months for varying complaints. Admitted 06/17/23 due to dysuria, was found to have E. coli bacteremia. Having some falls, patient having abdominal pain and weight loss. Abd/ pelvic CT stable. MRI of cervical spine shows C4-5 severe spinal stenosis with cord compression. Neurosurgery consulted. Antibiotics provided. Admitted 07/01/23 due to cervical myelopathy and cervical stenosis status post C3-6 ACDF. He was admitted overnight for pain control, therapy evaluation, and drain output monitoring. His postoperative course was complicated by worsening thrombocytopenia which improved back to his baseline with 1 unit of platelets. His drain output reduced to an acceptable level and was removed on the afternoon of postop day 1 and discharged the following day.   Echo 03/20/18: EF of 60-65% along with mild/moderate MR and a mildly elevated PA pressure of 42 mm Hg Echo 04/01/21: EF of 55-60% along with mild LVH/ LAE. Echo 03/25/22: EF 60-65% Echo 12/24/22: EF 60-65% along with mild LVH, mild LAE.    He presents today for a HF follow-up visit with a chief complaint of moderate fatigue with exertion. Chronic in nature. Has associated shortness of breath, cough, occasional chest pain, "severe" neck pain, anxiety and inability to sleep through the night due to his neck pain. Denies chest pain, palpitations, dizziness, pedal edema or abdominal distention. Weighing daily and group home weight chart reviewed.   Seeing PCP [name]) 07/15/23. He asks if I can reach out to neurosurgeon office to ask if his pain medication can be adjusted prior to his follow-up  appointment with them next week. He feels like his BP is elevated because of the pain in his neck.   ROS: All systems negative except as listed in HPI, PMH and Problem List.  SH:  Social History   Socioeconomic History   Marital status: Single    Spouse name: Not on file   Number of children: 2   Years of education: 12   Highest education level: 12th grade  Occupational History   Occupation: retired  Tobacco Use   Smoking status: Former   Smokeless tobacco: Never  Building services engineer status: Not on file  Substance and Sexual Activity   Alcohol use: Never   Drug use: Never   Sexual activity: Not Currently    Birth control/protection: None  Other Topics Concern   Not on file  Social History Narrative   Lives in [facility]   Social Determinants of Health   Financial Resource Strain: High Risk (04/27/2022)   Overall Financial Resource Strain (CARDIA)    Difficulty of Paying Living Expenses: Very hard  Food Insecurity: Food Insecurity Present (07/01/2023)   Hunger Vital Sign    Worried About Running Out of Food in the Last Year: Often true    Ran Out of Food in the Last Year: Often true  Transportation Needs: Unmet Transportation Needs (07/01/2023)   PRAPARE - Administrator, Civil Service (Medical): Yes    Lack of Transportation (Non-Medical): Yes  Physical Activity: Sufficiently Active (04/16/2018)   Exercise Vital Sign    Days of Exercise per Week: 7 days    Minutes of Exercise per Session: 30 min  Stress: Stress  Concern Present (04/16/2018)   [employer] of Occupational Health - Occupational Stress Questionnaire    Feeling of Stress : Very much  Social Connections: Moderately Isolated (04/30/2018)   Social Connection and Isolation Panel [NHANES]    Frequency of Communication with Friends and Family: Twice a week    Frequency of Social Gatherings with Friends and Family: Never    Attends Religious Services: More than 4 times per year    Active  Member of [employer] or Organizations: No    Attends Banker Meetings: Never    Marital Status: Widowed  Intimate Partner Violence: Not At Risk (07/01/2023)   [name], Afraid, [name], and Kick questionnaire    Fear of Current or Ex-Partner: No    Emotionally Abused: No    Physically Abused: No    Sexually Abused: No    FH:  Family History  Problem Relation Age of Onset   Hypertension Mother    Heart disease Mother    Heart failure Maternal Grandmother     Past Medical History:  Diagnosis Date   (HFpEF) heart failure with preserved ejection fraction (HCC)    a.) TTE 03/20/2018: EF 60-65%, no RWMAs, mild LAE, mild-mod MR, PASP 42; b.) TTE 03/31/2021: EF 55-60%, no RWMAs, mild LVH, mild LAE, AoV sclerosis without stenosis; c.) TTE 03/25/2022: EF 60-65%, no RWMAs, AoV sclerosis without stenosis; d.) TTE 12/24/2022: EF 60-65%, no RWMAs, mild LVH, mild LAE.   Anasarca    Anemia    Anxiety    Aortic atherosclerosis (HCC)    Arthritis    Asthma    Bacteremia due to Escherichia coli 05/2023   Bacteremia due to Klebsiella pneumoniae 2023   Bilateral carotid artery disease (HCC)    Bradycardia    CAD (coronary artery disease) 10/21/2006   a.) MV 10/21/2006: small reversible defect involving the apical  lateral wall c/w possible ischemia; b.) MV: 03/24/2018: no ischemia; c.) MV 10/04/2021: LAD calcifications   Cervical radiculopathy    Chest pain    a.) MV 03/21/2018: no ischemia; b.) MV 10/04/2021: no ischemia   Chronic back pain    Chronic common bile duct dilatation    Cirrhosis (HCC)    a.) (+) hepatic lesion on CT; likely hepatocellular carcinoma; b.) CT evidence of associated portal hypertension; c.) (+) abdominopelvic anasarca   COPD (chronic obstructive pulmonary disease) (HCC)    DDD (degenerative disc disease), lumbar    Depression    Frequent falls    GERD (gastroesophageal reflux disease)    HBV (hepatitis B virus) infection    HCV (hepatitis C virus)     Hernia of anterior abdominal wall    History of 2019 novel coronavirus disease (COVID-19) 04/16/2020   Homelessness    a.) limited on placement as patient is listed on [place] sex offender listing for exploitation of a minor (possession of child pornography); incarcerated 12 years (released 11/2017)   Hypertension    Infestation by bed bug 02/2022   Malingering    Migraine    Nephrolithiasis    Obesity    Portal hypertension (HCC)    PTSD (post-traumatic stress disorder)    a.) brief training in the [employer] at age 20; someone was killed in front of him   Pulmonary HTN (HCC)    a.) multiple CT scans desmontrating dilated PA c/w pHTN; b.) TTE 03/20/2018: PASP 42 mmHg   Sleep apnea    a.) no nocturnal PAP therapy   Stenosis of cervical spine with myelopathy (HCC)  Suicidal ideations    T2DM (type 2 diabetes mellitus) (HCC)    Thrombocytopenia (HCC)    a.) related to hepatic cirrhosis; spleen normal in size   TIA (transient ischemic attack)     Current Outpatient Medications  Medication Sig Dispense Refill   atorvastatin (LIPITOR) 40 MG tablet Take 1 tablet (40 mg total) by mouth daily. 30 tablet 0   citalopram (CELEXA) 20 MG tablet Take 1 tablet (20 mg total) by mouth daily. 30 tablet 0   empagliflozin (JARDIANCE) 10 MG TABS tablet Take 1 tablet (10 mg total) by mouth daily before breakfast. 30 tablet 0   furosemide (LASIX) 20 MG tablet Take 1 tablet (20 mg total) by mouth daily. 30 tablet 0   gabapentin (NEURONTIN) 300 MG capsule Take 300 mg by mouth 2 (two) times daily.     losartan (COZAAR) 50 MG tablet Take 1 tablet (50 mg total) by mouth daily. 30 tablet 0   methocarbamol (ROBAXIN) 500 MG tablet Take 1 tablet (500 mg total) by mouth every 6 (six) hours as needed for muscle spasms. 120 tablet 0   Multiple Vitamin (MULTIVITAMIN WITH MINERALS) TABS tablet Take 1 tablet by mouth daily.     ondansetron (ZOFRAN) 4 MG tablet Take 1 tablet (4 mg total) by mouth every 6 (six) hours as needed  for nausea. 20 tablet 0   oxyCODONE (OXY IR/ROXICODONE) 5 MG immediate release tablet Take 1 tablet (5 mg total) by mouth every 4 (four) hours as needed for moderate pain (pain score 4-6) or severe pain (pain score 7-10) ((score 4 to 6)). 30 tablet 0   pantoprazole (PROTONIX) 40 MG tablet Take 1 tablet (40 mg total) by mouth daily. 30 tablet 0   QUEtiapine (SEROQUEL) 50 MG tablet Take 1 tablet (50 mg total) by mouth at bedtime. 30 tablet 0   rifaximin (XIFAXAN) 550 MG TABS tablet Take 1 tablet (550 mg total) by mouth 2 (two) times daily. 60 tablet 0   senna (SENOKOT) 8.6 MG TABS tablet Take 1 tablet (8.6 mg total) by mouth 2 (two) times daily as needed for mild constipation. 60 tablet 0   spironolactone (ALDACTONE) 25 MG tablet Take 0.5 tablets (12.5 mg total) by mouth daily. 30 tablet 0   No current facility-administered medications for this visit.   Vitals:   07/09/23 1334 07/09/23 1337  BP: (!) 172/73 (!) 171/72  Pulse: (!) 44   SpO2: 100%   Weight: 199 lb (90.3 kg)    Wt Readings from Last 3 Encounters:  07/09/23 199 lb (90.3 kg)  07/01/23 199 lb 4.7 oz (90.4 kg)  06/27/23 199 lb 4.7 oz (90.4 kg)   Lab Results  Component Value Date   CREATININE 0.98 06/21/2023   CREATININE 1.00 06/20/2023   CREATININE 0.94 06/18/2023   PHYSICAL EXAM:  General:  Well appearing. No resp difficulty HEENT: normal Neck: supple. JVP flat. No lymphadenopathy. Bandage on anterior neck Cor: PMI normal. Regular rhythm. Bradycardic. No rubs, gallops or murmurs. Lungs: clear Abdomen: soft, nontender, nondistended. No hepatosplenomegaly. No bruits or masses.  Extremities: no cyanosis, clubbing, rash. trace pitting edema bilateral lower legs Neuro: alert & oriented x3, cranial nerves grossly intact. Moves all 4 extremities w/o difficulty. Affect pleasant.   ECG: not done   ASSESSMENT & PLAN:  1: NICM with preserved ejection fraction- - likely due to HTN - NYHA class III - euvolemic - being  weighed daily at the group home; reminded to call for overnight weight gain of >  3 pounds or weekly weight gain of > 5 pounds - weight down 28.8 pounds from last visit here 6 months ago  - Echo 03/20/18: EF of 60-65% along with mild/moderate MR and a mildly elevated PA pressure of 42 mm Hg - Echo 04/01/21: EF of 55-60% along with mild LVH/ LAE. - Echo 03/25/22: EF 60-65% - Echo 12/24/22: EF 60-65% along with mild LVH, mild LAE.   - continue jardiance 10mg  daily - continue furosemide 40mg  daily - continue losartan 50mg  daily; consider changing to entresto now that he's at the group home - continue 12.5mg  spironolactone daily - had telemedicine visit with cardiology Mariah Milling) 02/22 - BNP 03/23/23 was 1563.3  2: HTN- - BP 172/73; rechecked it was 171/72 but patient says that he's in severe neck pain from his surgery - will continue to monitor - sees PCP at [facility] [name]) next week 07/15/23 - BMP 06/21/23 reviewed and showed sodium 136, potassium 4.2, creatinine 0.98 and GFR >60  3: DM- - A1c 06/27/23 was 5.0%  4: Homelessness- - resolved as he now lives at a Group Home called [facility]  5: Liver cirrhosis- - likely secondary hepatitis C w/ positive viral load in 2019 - acute viral panel approximately 02/24 also demonstrated positive for hepatitis B antigen.  - needs to get established with GI  6: Cervical myelopathy- - status post C3-6 ACDF 11/24 - currently taking oxycodone 5mg  Q 4hrs PRN - have sent a secure chat to Rochele Raring, PA with neurosurgery asking if they would look into his pain management - patient reports severe pain at the surgical site with subsequent elevation of blood pressure  Return in 6 weeks, sooner if needed

## 2023-07-09 ENCOUNTER — Encounter: Payer: Self-pay | Admitting: Family

## 2023-07-09 ENCOUNTER — Telehealth: Payer: Self-pay

## 2023-07-09 ENCOUNTER — Ambulatory Visit: Payer: 59 | Attending: Family | Admitting: Family

## 2023-07-09 VITALS — BP 171/72 | HR 44 | Wt 199.0 lb

## 2023-07-09 DIAGNOSIS — I10 Essential (primary) hypertension: Secondary | ICD-10-CM

## 2023-07-09 DIAGNOSIS — M4802 Spinal stenosis, cervical region: Secondary | ICD-10-CM | POA: Diagnosis not present

## 2023-07-09 DIAGNOSIS — Z79899 Other long term (current) drug therapy: Secondary | ICD-10-CM | POA: Insufficient documentation

## 2023-07-09 DIAGNOSIS — J4489 Other specified chronic obstructive pulmonary disease: Secondary | ICD-10-CM | POA: Insufficient documentation

## 2023-07-09 DIAGNOSIS — I5032 Chronic diastolic (congestive) heart failure: Secondary | ICD-10-CM | POA: Insufficient documentation

## 2023-07-09 DIAGNOSIS — F419 Anxiety disorder, unspecified: Secondary | ICD-10-CM | POA: Diagnosis not present

## 2023-07-09 DIAGNOSIS — F32A Depression, unspecified: Secondary | ICD-10-CM | POA: Diagnosis not present

## 2023-07-09 DIAGNOSIS — K7460 Unspecified cirrhosis of liver: Secondary | ICD-10-CM | POA: Diagnosis not present

## 2023-07-09 DIAGNOSIS — E119 Type 2 diabetes mellitus without complications: Secondary | ICD-10-CM | POA: Insufficient documentation

## 2023-07-09 DIAGNOSIS — Z8619 Personal history of other infectious and parasitic diseases: Secondary | ICD-10-CM | POA: Diagnosis not present

## 2023-07-09 DIAGNOSIS — Z7984 Long term (current) use of oral hypoglycemic drugs: Secondary | ICD-10-CM | POA: Insufficient documentation

## 2023-07-09 DIAGNOSIS — Z5900 Homelessness unspecified: Secondary | ICD-10-CM | POA: Insufficient documentation

## 2023-07-09 DIAGNOSIS — Z8673 Personal history of transient ischemic attack (TIA), and cerebral infarction without residual deficits: Secondary | ICD-10-CM | POA: Diagnosis not present

## 2023-07-09 DIAGNOSIS — I110 Hypertensive heart disease with heart failure: Secondary | ICD-10-CM | POA: Insufficient documentation

## 2023-07-09 DIAGNOSIS — I428 Other cardiomyopathies: Secondary | ICD-10-CM | POA: Diagnosis present

## 2023-07-09 DIAGNOSIS — G959 Disease of spinal cord, unspecified: Secondary | ICD-10-CM

## 2023-07-09 DIAGNOSIS — R188 Other ascites: Secondary | ICD-10-CM

## 2023-07-09 NOTE — Telephone Encounter (Signed)
Secure Chat from Clarisa Kindred, FNP: "[name], I'm seeing this mutual patient in the HF clinic and he sees you next week for post op. He says that he's in severe pain from the surgery & wants to know if you would increase his pain medication until he sees you. His BP is elevated today but he says that it's because of the pain. He's currently staying in a group home so if you feel it's appropriate, you can let them know of any med changes. The contact name at the group home is in his demographic info. Thanks"  Manning Charity, PA-C: "I will have my office reach out to him and discuss things further so that we can determine the appropriate next steps! Thank you for letting us know "

## 2023-07-09 NOTE — Telephone Encounter (Signed)
I spoke with [name]. He is taking oxycodone every 4 hours as needed and methocarbamol every 6 hours as needed and that usually helps. He is also taking gabapentin 300mg  twice per day. [name] reports that Mr Larranaga took oxycodone this morning, but did not take a dose prior to leaving for his cardiology appointment because he did not express that he was in pain. They feel that he was in pain at the time of his cardiology appointment because his pain medication had worn off and he was due for another dose. He had a court appointment and a doctors appointment today, so today was the first time he has been out of the house since surgery and he was gone for several hours.  [name] suggested that they see how he is feeling tomorrow and they will call us back if his pain is not under control. Per [name], Mr Ribeiro hasn't been in much pain since surgery and hasn't needed to take much of the oxycodone. I did advise that they can add tylenol as needed, but that he needs to avoid NSAIDS until he is 3 months out from surgery.

## 2023-07-16 ENCOUNTER — Encounter: Payer: 59 | Admitting: Neurosurgery

## 2023-07-18 ENCOUNTER — Ambulatory Visit (INDEPENDENT_AMBULATORY_CARE_PROVIDER_SITE_OTHER): Payer: 59 | Admitting: Neurosurgery

## 2023-07-18 ENCOUNTER — Encounter: Payer: Self-pay | Admitting: Neurosurgery

## 2023-07-18 VITALS — BP 144/81 | HR 46 | Temp 99.2°F | Wt 189.2 lb

## 2023-07-18 DIAGNOSIS — G959 Disease of spinal cord, unspecified: Secondary | ICD-10-CM

## 2023-07-18 DIAGNOSIS — Z981 Arthrodesis status: Secondary | ICD-10-CM

## 2023-07-18 NOTE — Progress Notes (Signed)
   REFERRING PHYSICIAN:  [name], Md [address] [place],  [place] [address]  DOS: 07/01/23 C3-6 ACDF  HISTORY OF PRESENT ILLNESS: [name] is about 2 weeks status post ACDF for cervical myelopathy. Overall, he is doing well.  He reports improved strength in his upper extremities and improvement in his gait.  He continues to have some left-sided neck pain around his incision and a globus sensation when swallowing but is tolerating a largely normal diet.  PHYSICAL EXAMINATION:  NEUROLOGICAL:  General: In no acute distress.   Awake, alert, oriented to person, place, and time.  Pupils equal round and reactive to light.    Strength: Side Biceps Triceps Deltoid Interossei Grip Wrist Ext. Wrist Flex.  R 5 5 5 5 5 5 5   L 5 5 5 5 5 5 5    Incision c/d/I and healing well  Imaging:  No interval imaging to review  Assessment / Plan: CHANTHA MURAD is doing well after recent ACDF.  He continues to require intermittent oxycodone for pain.  We discussed activity escalation and I have advised the patient to lift up to 10 pounds until 6 weeks after surgery, then increase up to 25 pounds until 12 weeks after surgery.  After 12 weeks post-op, the patient advised to increase activity as tolerated. he will return to clinic in approximately 4 weeks with Dr. Myer Haff with cervical xrays prior or sooner should he have any questions or concerns.  He expressed understanding and was in agreement with this plan.  Manning Charity PA-C Dept of Neurosurgery

## 2023-07-23 ENCOUNTER — Telehealth: Payer: Self-pay | Admitting: Neurosurgery

## 2023-07-23 ENCOUNTER — Encounter: Payer: Self-pay | Admitting: Family Medicine

## 2023-07-23 NOTE — Telephone Encounter (Signed)
Per Dr Myer Haff, if the skin tear is of any significance, he should go to urgent care or PCP. If his heart rate is different than the norm, he should contact his PCP.

## 2023-07-23 NOTE — Telephone Encounter (Signed)
Attempted to call Noreene Larsson. No answer, mailbox full.

## 2023-07-23 NOTE — Telephone Encounter (Signed)
I spoke with [facility] regarding Dr Lucienne Capers recommendations. He verbalized understanding.

## 2023-07-23 NOTE — Telephone Encounter (Signed)
Notified [name] of Dr Lucienne Capers response. She asked me to contact his group home director to discuss.

## 2023-07-23 NOTE — Telephone Encounter (Signed)
Noreene Larsson from [facility] is calling to let our office know that the patient reported a fall to her at today's visit that occurred last night in his bathroom resulting in a skin tear next to his left eye. She also states that his heart rate was low at 50 bpm at rest.

## 2023-07-29 ENCOUNTER — Inpatient Hospital Stay
Admission: EM | Admit: 2023-07-29 | Discharge: 2023-08-05 | DRG: 441 | Disposition: A | Discharge status: Home Health | Payer: 59 | Attending: Internal Medicine | Admitting: Internal Medicine

## 2023-07-29 ENCOUNTER — Emergency Department: Payer: 59

## 2023-07-29 DIAGNOSIS — G9341 Metabolic encephalopathy: Secondary | ICD-10-CM | POA: Diagnosis present

## 2023-07-29 DIAGNOSIS — J4489 Other specified chronic obstructive pulmonary disease: Secondary | ICD-10-CM | POA: Diagnosis present

## 2023-07-29 DIAGNOSIS — B1920 Unspecified viral hepatitis C without hepatic coma: Secondary | ICD-10-CM | POA: Diagnosis present

## 2023-07-29 DIAGNOSIS — E861 Hypovolemia: Secondary | ICD-10-CM | POA: Diagnosis present

## 2023-07-29 DIAGNOSIS — I498 Other specified cardiac arrhythmias: Secondary | ICD-10-CM | POA: Diagnosis present

## 2023-07-29 DIAGNOSIS — E7220 Disorder of urea cycle metabolism, unspecified: Principal | ICD-10-CM

## 2023-07-29 DIAGNOSIS — I110 Hypertensive heart disease with heart failure: Secondary | ICD-10-CM | POA: Diagnosis present

## 2023-07-29 DIAGNOSIS — N179 Acute kidney failure, unspecified: Secondary | ICD-10-CM | POA: Diagnosis present

## 2023-07-29 DIAGNOSIS — K219 Gastro-esophageal reflux disease without esophagitis: Secondary | ICD-10-CM | POA: Diagnosis present

## 2023-07-29 DIAGNOSIS — J449 Chronic obstructive pulmonary disease, unspecified: Secondary | ICD-10-CM | POA: Diagnosis present

## 2023-07-29 DIAGNOSIS — Z885 Allergy status to narcotic agent status: Secondary | ICD-10-CM

## 2023-07-29 DIAGNOSIS — Z5900 Homelessness unspecified: Secondary | ICD-10-CM

## 2023-07-29 DIAGNOSIS — I700 Atherosclerosis of aorta: Secondary | ICD-10-CM | POA: Diagnosis present

## 2023-07-29 DIAGNOSIS — Z882 Allergy status to sulfonamides status: Secondary | ICD-10-CM

## 2023-07-29 DIAGNOSIS — I272 Pulmonary hypertension, unspecified: Secondary | ICD-10-CM | POA: Diagnosis present

## 2023-07-29 DIAGNOSIS — I10 Essential (primary) hypertension: Secondary | ICD-10-CM | POA: Diagnosis present

## 2023-07-29 DIAGNOSIS — Z981 Arthrodesis status: Secondary | ICD-10-CM

## 2023-07-29 DIAGNOSIS — Z79899 Other long term (current) drug therapy: Secondary | ICD-10-CM

## 2023-07-29 DIAGNOSIS — I2510 Atherosclerotic heart disease of native coronary artery without angina pectoris: Secondary | ICD-10-CM | POA: Diagnosis present

## 2023-07-29 DIAGNOSIS — M1990 Unspecified osteoarthritis, unspecified site: Secondary | ICD-10-CM | POA: Diagnosis present

## 2023-07-29 DIAGNOSIS — F17210 Nicotine dependence, cigarettes, uncomplicated: Secondary | ICD-10-CM | POA: Diagnosis present

## 2023-07-29 DIAGNOSIS — Z91148 Patient's other noncompliance with medication regimen for other reason: Secondary | ICD-10-CM

## 2023-07-29 DIAGNOSIS — K7460 Unspecified cirrhosis of liver: Secondary | ICD-10-CM | POA: Diagnosis present

## 2023-07-29 DIAGNOSIS — R188 Other ascites: Secondary | ICD-10-CM

## 2023-07-29 DIAGNOSIS — K7682 Hepatic encephalopathy: Principal | ICD-10-CM | POA: Diagnosis present

## 2023-07-29 DIAGNOSIS — Z9049 Acquired absence of other specified parts of digestive tract: Secondary | ICD-10-CM

## 2023-07-29 DIAGNOSIS — Z888 Allergy status to other drugs, medicaments and biological substances status: Secondary | ICD-10-CM

## 2023-07-29 DIAGNOSIS — Z8616 Personal history of COVID-19: Secondary | ICD-10-CM

## 2023-07-29 DIAGNOSIS — E1169 Type 2 diabetes mellitus with other specified complication: Secondary | ICD-10-CM | POA: Insufficient documentation

## 2023-07-29 DIAGNOSIS — Z91030 Bee allergy status: Secondary | ICD-10-CM

## 2023-07-29 DIAGNOSIS — D61818 Other pancytopenia: Secondary | ICD-10-CM | POA: Diagnosis present

## 2023-07-29 DIAGNOSIS — R296 Repeated falls: Secondary | ICD-10-CM | POA: Diagnosis present

## 2023-07-29 DIAGNOSIS — E119 Type 2 diabetes mellitus without complications: Secondary | ICD-10-CM

## 2023-07-29 DIAGNOSIS — R001 Bradycardia, unspecified: Secondary | ICD-10-CM | POA: Diagnosis present

## 2023-07-29 DIAGNOSIS — F4310 Post-traumatic stress disorder, unspecified: Secondary | ICD-10-CM | POA: Diagnosis present

## 2023-07-29 DIAGNOSIS — K766 Portal hypertension: Secondary | ICD-10-CM | POA: Diagnosis present

## 2023-07-29 DIAGNOSIS — I5032 Chronic diastolic (congestive) heart failure: Secondary | ICD-10-CM | POA: Diagnosis present

## 2023-07-29 DIAGNOSIS — E785 Hyperlipidemia, unspecified: Secondary | ICD-10-CM | POA: Diagnosis present

## 2023-07-29 DIAGNOSIS — Z8249 Family history of ischemic heart disease and other diseases of the circulatory system: Secondary | ICD-10-CM

## 2023-07-29 DIAGNOSIS — M51369 Other intervertebral disc degeneration, lumbar region without mention of lumbar back pain or lower extremity pain: Secondary | ICD-10-CM | POA: Diagnosis present

## 2023-07-29 DIAGNOSIS — G8929 Other chronic pain: Secondary | ICD-10-CM | POA: Diagnosis present

## 2023-07-29 DIAGNOSIS — J439 Emphysema, unspecified: Secondary | ICD-10-CM

## 2023-07-29 DIAGNOSIS — F32A Depression, unspecified: Secondary | ICD-10-CM | POA: Diagnosis present

## 2023-07-29 DIAGNOSIS — Z8673 Personal history of transient ischemic attack (TIA), and cerebral infarction without residual deficits: Secondary | ICD-10-CM

## 2023-07-29 DIAGNOSIS — M5412 Radiculopathy, cervical region: Secondary | ICD-10-CM | POA: Diagnosis present

## 2023-07-29 DIAGNOSIS — R54 Age-related physical debility: Secondary | ICD-10-CM | POA: Diagnosis present

## 2023-07-29 DIAGNOSIS — Z7984 Long term (current) use of oral hypoglycemic drugs: Secondary | ICD-10-CM

## 2023-07-29 LAB — COMPREHENSIVE METABOLIC PANEL
ALT: 34 U/L (ref 0–44)
AST: 73 U/L — ABNORMAL HIGH (ref 15–41)
Albumin: 2.6 g/dL — ABNORMAL LOW (ref 3.5–5.0)
Alkaline Phosphatase: 98 U/L (ref 38–126)
Anion gap: 6 (ref 5–15)
BUN: 20 mg/dL (ref 8–23)
CO2: 25 mmol/L (ref 22–32)
Calcium: 9.6 mg/dL (ref 8.9–10.3)
Chloride: 107 mmol/L (ref 98–111)
Creatinine, Ser: 1.36 mg/dL — ABNORMAL HIGH (ref 0.61–1.24)
GFR, Estimated: 55 mL/min — ABNORMAL LOW (ref >60)
Glucose, Bld: 119 mg/dL — ABNORMAL HIGH (ref 70–99)
Potassium: 3.9 mmol/L (ref 3.5–5.1)
Sodium: 138 mmol/L (ref 135–145)
Total Bilirubin: 1.5 mg/dL — ABNORMAL HIGH (ref <1.2)
Total Protein: 6.7 g/dL (ref 6.5–8.1)

## 2023-07-29 LAB — CBC WITH DIFFERENTIAL/PLATELET
Abs Immature Granulocytes: 0 10*3/uL (ref 0.00–0.07)
Basophils Absolute: 0 10*3/uL (ref 0.0–0.1)
Basophils Relative: 1 %
Eosinophils Absolute: 0.1 10*3/uL (ref 0.0–0.5)
Eosinophils Relative: 4 %
HCT: 38.7 % — ABNORMAL LOW (ref 39.0–52.0)
Hemoglobin: 13.2 g/dL (ref 13.0–17.0)
Immature Granulocytes: 0 %
Lymphocytes Relative: 41 %
Lymphs Abs: 1 10*3/uL (ref 0.7–4.0)
MCH: 31.5 pg (ref 26.0–34.0)
MCHC: 34.1 g/dL (ref 30.0–36.0)
MCV: 92.4 fL (ref 80.0–100.0)
Monocytes Absolute: 0.3 10*3/uL (ref 0.1–1.0)
Monocytes Relative: 12 %
Neutro Abs: 1.1 10*3/uL — ABNORMAL LOW (ref 1.7–7.7)
Neutrophils Relative %: 42 %
Platelets: 75 10*3/uL — ABNORMAL LOW (ref 150–400)
RBC: 4.19 MIL/uL — ABNORMAL LOW (ref 4.22–5.81)
RDW: 16.1 % — ABNORMAL HIGH (ref 11.5–15.5)
Smear Review: NORMAL
WBC: 2.5 10*3/uL — ABNORMAL LOW (ref 4.0–10.5)
nRBC: 0 % (ref 0.0–0.2)

## 2023-07-29 LAB — URINALYSIS, W/ REFLEX TO CULTURE (INFECTION SUSPECTED)
Bilirubin Urine: NEGATIVE
Glucose, UA: >=500 mg/dL — AB
Hgb urine dipstick: NEGATIVE
Ketones, ur: NEGATIVE mg/dL
Leukocytes,Ua: NEGATIVE
Nitrite: NEGATIVE
Protein, ur: NEGATIVE mg/dL
Specific Gravity, Urine: 1.016 (ref 1.005–1.030)
pH: 7 (ref 5.0–8.0)

## 2023-07-29 LAB — CBG MONITORING, ED
Glucose-Capillary: 75 mg/dL (ref 70–99)
Glucose-Capillary: 110 mg/dL — ABNORMAL HIGH (ref 70–99)

## 2023-07-29 LAB — LACTIC ACID, PLASMA: Lactic Acid, Venous: 1.8 mmol/L (ref 0.5–1.9)

## 2023-07-29 LAB — AMMONIA: Ammonia: 98 umol/L — ABNORMAL HIGH (ref 9–35)

## 2023-07-29 MED ORDER — CITALOPRAM HYDROBROMIDE 10 MG PO TABS
20.0000 mg | ORAL_TABLET | Freq: Every day | ORAL | Status: DC
  Administered 2023-07-30 – 2023-08-05 (×7): 20 mg via ORAL
  Filled 2023-07-29 (×7): qty 2

## 2023-07-29 MED ORDER — LACTULOSE 10 GM/15ML PO SOLN
20.0000 g | Freq: Two times a day (BID) | ORAL | Status: DC
  Administered 2023-07-29 – 2023-07-31 (×5): 20 g via ORAL
  Filled 2023-07-29 (×5): qty 30

## 2023-07-29 MED ORDER — LACTULOSE 10 GM/15ML PO SOLN
30.0000 g | Freq: Once | ORAL | Status: AC
  Administered 2023-07-29: 30 g via ORAL
  Filled 2023-07-29: qty 60

## 2023-07-29 MED ORDER — SODIUM CHLORIDE 0.9% FLUSH
3.0000 mL | Freq: Two times a day (BID) | INTRAVENOUS | Status: DC
Start: 2023-07-29 — End: 2023-08-05
  Administered 2023-07-29 – 2023-08-05 (×14): 3 mL via INTRAVENOUS

## 2023-07-29 MED ORDER — RIFAXIMIN 550 MG PO TABS
550.0000 mg | ORAL_TABLET | Freq: Two times a day (BID) | ORAL | Status: DC
  Administered 2023-07-29 – 2023-08-05 (×14): 550 mg via ORAL
  Filled 2023-07-29 (×15): qty 1

## 2023-07-29 MED ORDER — GABAPENTIN 300 MG PO CAPS
300.0000 mg | ORAL_CAPSULE | Freq: Two times a day (BID) | ORAL | Status: DC
  Administered 2023-07-29 – 2023-08-05 (×14): 300 mg via ORAL
  Filled 2023-07-29 (×14): qty 1

## 2023-07-29 MED ORDER — ACETAMINOPHEN 325 MG PO TABS
650.0000 mg | ORAL_TABLET | Freq: Four times a day (QID) | ORAL | Status: DC | PRN
  Administered 2023-07-31 – 2023-08-04 (×5): 650 mg via ORAL
  Filled 2023-07-29 (×5): qty 2

## 2023-07-29 MED ORDER — ATORVASTATIN CALCIUM 20 MG PO TABS
40.0000 mg | ORAL_TABLET | Freq: Every day | ORAL | Status: DC
Start: 2023-07-29 — End: 2023-08-05
  Administered 2023-07-29 – 2023-08-05 (×8): 40 mg via ORAL
  Filled 2023-07-29 (×8): qty 2

## 2023-07-29 MED ORDER — INSULIN ASPART 100 UNIT/ML IJ SOLN
0.0000 [IU] | Freq: Three times a day (TID) | INTRAMUSCULAR | Status: DC
  Administered 2023-07-30 (×2): 2 [IU] via SUBCUTANEOUS
  Filled 2023-07-29: qty 1

## 2023-07-29 MED ORDER — ONDANSETRON HCL 4 MG PO TABS: 4.0000 mg | ORAL_TABLET | Freq: Four times a day (QID) | ORAL | Status: DC | PRN

## 2023-07-29 MED ORDER — LACTATED RINGERS IV BOLUS
500.0000 mL | Freq: Once | INTRAVENOUS | Status: AC
  Administered 2023-07-29: 500 mL via INTRAVENOUS

## 2023-07-29 MED ORDER — ACETAMINOPHEN 650 MG RE SUPP: 650.0000 mg | Freq: Four times a day (QID) | RECTAL | Status: DC | PRN

## 2023-07-29 MED ORDER — ONDANSETRON HCL 4 MG/2ML IJ SOLN: 4.0000 mg | Freq: Four times a day (QID) | INTRAMUSCULAR | Status: DC | PRN

## 2023-07-29 MED ORDER — PANTOPRAZOLE SODIUM 40 MG PO TBEC
40.0000 mg | DELAYED_RELEASE_TABLET | Freq: Every day | ORAL | Status: DC
  Administered 2023-07-30 – 2023-08-05 (×7): 40 mg via ORAL
  Filled 2023-07-29 (×7): qty 1

## 2023-07-29 MED ORDER — QUETIAPINE FUMARATE 25 MG PO TABS
50.0000 mg | ORAL_TABLET | Freq: Every day | ORAL | Status: DC
  Administered 2023-07-29 – 2023-08-04 (×7): 50 mg via ORAL
  Filled 2023-07-29 (×7): qty 2

## 2023-07-29 NOTE — Assessment & Plan Note (Signed)
Long-term history of a stable bradycardia, sinus rhythm on examination today.  Although occasionally labeled as junctional rhythm, P waves are present and consistent.  Given patient is otherwise asymptomatic, no need for continued telemetry monitoring

## 2023-07-29 NOTE — ED Triage Notes (Signed)
Pt to ED via EMS from group home with AMS since yesterday. Facility reports pt is unable to stand on his own and was not eating. On exam pt is A&O x4, with left arm weakness, no visual changes but reports he fell yesterday and hit his head.      CBG 244 96.1 Ax temp A fib 204/87 99% RA

## 2023-07-29 NOTE — Assessment & Plan Note (Signed)
Stable at this time with no evidence of bleeding on examination.  - Repeat CBC in the a.m.

## 2023-07-29 NOTE — Assessment & Plan Note (Signed)
-   SSI, sensitive - Hold home [place]

## 2023-07-29 NOTE — Assessment & Plan Note (Addendum)
In the setting of poor p.o. intake reported by facility, likely due to hepatic encephalopathy. Slowly improving. -Received some IV fluid -Monitor renal function -Avoid nephrotoxins

## 2023-07-29 NOTE — Assessment & Plan Note (Addendum)
History of decompensated cirrhosis in the setting of active hepatitis C.  Complicated by acute on chronic encephalopathy.  - Hold home Lasix and spironolactone in the setting of AKI - Resume upon resolution of AKI

## 2023-07-29 NOTE — H&P (Signed)
History and Physical    Patient: [name] ZOX:[id number] DOB: 1951-03-28 DOA: 07/29/2023 DOS: the patient was seen and examined on 07/29/2023 PCP: [name], MD  Patient coming from: ALF/ILF  Chief Complaint:  Chief Complaint  Patient presents with   Altered Mental Status   Fall   HPI: [name] is a 72 y.o. 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  [name] states that he had a fall yesterday, but cannot tell me the details regarding the fall, stating he just fell.  He denies any prodromal symptoms including dizziness, palpitations, shortness of breath or chest pain.  He states that this time, he is not experiencing any pain or discomfort, but can feel that he is experiencing some brain fog.  He notes that he has not taken lactulose in 1 month because it was giving him continuous diarrhea.  In addition, he notes that his heart rate is always low; he denies any shortness of breath, dizziness or chest pain with exertion or at rest.  ED course: On arrival to the ED, patient was hypertensive at 180/76 with heart rate of 51.  He was saturating at 99% on room air.  He is afebrile.  Initial workup notable for WBC of 2.5, hemoglobin of 13.2, platelets of 75, creatinine 0.36 with GFR 55 and ammonia of 98.  Lactic acid 1.8.  Urinalysis with glucosuria and rare bacteria but no leukocytes, nitrites or WBCs/hpf.  CT of the head was obtained with no acute intracranial abnormality.  Chest x-ray was obtained with no active disease.  Patient started on lactulose and [facility] contacted for admission.  Review of Systems: As mentioned in the history of present illness. All other systems reviewed and are negative.  Past Medical History:  Diagnosis Date   (HFpEF)  heart failure with preserved ejection fraction (HCC)    a.) TTE 03/20/2018: EF 60-65%, no RWMAs, mild LAE, mild-mod MR, PASP 42; b.) TTE 03/31/2021: EF 55-60%, no RWMAs, mild LVH, mild LAE, AoV sclerosis without stenosis; c.) TTE 03/25/2022: EF 60-65%, no RWMAs, AoV sclerosis without stenosis; d.) TTE 12/24/2022: EF 60-65%, no RWMAs, mild LVH, mild LAE.   Anasarca    Anemia    Anxiety    Aortic atherosclerosis (HCC)    Arthritis    Asthma    Bacteremia due to Escherichia coli 05/2023   Bacteremia due to Klebsiella pneumoniae 2023   Bilateral carotid artery disease (HCC)    Bradycardia    CAD (coronary artery disease) 10/21/2006   a.) MV 10/21/2006: small reversible defect involving the apical  lateral wall c/w possible ischemia; b.) MV: 03/24/2018: no ischemia; c.) MV 10/04/2021: LAD calcifications   Cervical radiculopathy    Chest pain    a.) MV 03/21/2018: no ischemia; b.) MV 10/04/2021: no ischemia   Chronic back pain    Chronic common bile duct dilatation    Cirrhosis (HCC)    a.) (+) hepatic lesion on CT; likely hepatocellular carcinoma; b.) CT evidence of associated portal hypertension; c.) (+) abdominopelvic anasarca   COPD (chronic obstructive pulmonary disease) (HCC)    DDD (degenerative disc disease), lumbar    Depression    Frequent falls    GERD (gastroesophageal reflux disease)    HBV (hepatitis B virus) infection    HCV (hepatitis C  virus)    Hernia of anterior abdominal wall    History of 2019 novel coronavirus disease (COVID-19) 04/16/2020   Homelessness    a.) limited on placement as patient is listed on [place] sex offender listing for exploitation of a minor (possession of child pornography); incarcerated 12 years (released 11/2017)   Hypertension    Infestation by bed bug 02/2022   Malingering    Migraine    Nephrolithiasis    Obesity    Portal hypertension (HCC)    PTSD (post-traumatic stress disorder)    a.) brief training in the [employer] at age 57; someone was  killed in front of him   Pulmonary HTN (HCC)    a.) multiple CT scans desmontrating dilated PA c/w pHTN; b.) TTE 03/20/2018: PASP 42 mmHg   Sleep apnea    a.) no nocturnal PAP therapy   Stenosis of cervical spine with myelopathy (HCC)    Suicidal ideations    T2DM (type 2 diabetes mellitus) (HCC)    Thrombocytopenia (HCC)    a.) related to hepatic cirrhosis; spleen normal in size   TIA (transient ischemic attack)    Past Surgical History:  Procedure Laterality Date   ANTERIOR CERVICAL DECOMP/DISCECTOMY FUSION N/A 07/01/2023   Procedure: ANTERIOR CERVICAL DISCECTOMY AND FUSION (FORGE);  Surgeon: Venetia Night, MD;  Location: [facility] ORS;  Service: Neurosurgery;  Laterality: N/A;  Keep as last case of the day per Dr Myer Haff   APPENDECTOMY     CARDIAC CATHETERIZATION     CHOLECYSTECTOMY     EYE SURGERY     INNER EAR SURGERY     NOSE SURGERY     XI ROBOTIC LAPAROSCOPIC ASSISTED APPENDECTOMY N/A 11/05/2022   Procedure: XI ROBOTIC LAPAROSCOPIC ASSISTED APPENDECTOMY;  Surgeon: Carolan Shiver, MD;  Location: [facility] ORS;  Service: General;  Laterality: N/A;   Social History:  reports that he has been smoking cigarettes. He has never used smokeless tobacco. He reports that he does not drink alcohol and does not use drugs.  Allergies  Allergen Reactions   Bee Venom Anaphylaxis   Codeine Itching    Only itching. Recently allergy tested at [facility]   Lactulose Diarrhea    Patient states causes diarrhea   Novocain [Procaine]    Sulfa Antibiotics Other (See Comments)    Family History  Problem Relation Age of Onset   Hypertension Mother    Heart disease Mother    Heart failure Maternal Grandmother     Prior to Admission medications   Medication Sig Start Date End Date Taking? Authorizing Provider  atorvastatin (LIPITOR) 40 MG tablet Take 1 tablet (40 mg total) by mouth daily. 06/21/23 06/20/24  Alford Highland, MD  citalopram (CELEXA) 20 MG tablet Take 1 tablet (20 mg total) by  mouth daily. 06/21/23 07/21/23  Alford Highland, MD  empagliflozin (JARDIANCE) 10 MG TABS tablet Take 1 tablet (10 mg total) by mouth daily before breakfast. 06/21/23   Alford Highland, MD  furosemide (LASIX) 20 MG tablet Take 1 tablet (20 mg total) by mouth daily. 06/21/23   Alford Highland, MD  gabapentin (NEURONTIN) 300 MG capsule Take 300 mg by mouth 2 (two) times daily. 06/07/23   [provider]  losartan (COZAAR) 50 MG tablet Take 1 tablet (50 mg total) by mouth daily. 06/21/23   Alford Highland, MD  methocarbamol (ROBAXIN) 500 MG tablet Take 1 tablet (500 mg total) by mouth every 6 (six) hours as needed for muscle spasms. 07/02/23   Susanne Borders, PA  Multiple  Vitamin (MULTIVITAMIN WITH MINERALS) TABS tablet Take 1 tablet by mouth daily.    [provider]  ondansetron (ZOFRAN) 4 MG tablet Take 1 tablet (4 mg total) by mouth every 6 (six) hours as needed for nausea. 06/21/23   Alford Highland, MD  oxyCODONE (OXY IR/ROXICODONE) 5 MG immediate release tablet Take 1 tablet (5 mg total) by mouth every 4 (four) hours as needed for moderate pain (pain score 4-6) or severe pain (pain score 7-10) ((score 4 to 6)). 07/02/23   Susanne Borders, PA  pantoprazole (PROTONIX) 40 MG tablet Take 1 tablet (40 mg total) by mouth daily. 06/21/23 07/21/23  Alford Highland, MD  QUEtiapine (SEROQUEL) 50 MG tablet Take 1 tablet (50 mg total) by mouth at bedtime. 06/21/23   Alford Highland, MD  rifaximin (XIFAXAN) 550 MG TABS tablet Take 1 tablet (550 mg total) by mouth 2 (two) times daily. 06/21/23   Alford Highland, MD  senna (SENOKOT) 8.6 MG TABS tablet Take 1 tablet (8.6 mg total) by mouth 2 (two) times daily as needed for mild constipation. 07/02/23   Susanne Borders, PA  spironolactone (ALDACTONE) 25 MG tablet Take 0.5 tablets (12.5 mg total) by mouth daily. 06/21/23 08/20/23  Alford Highland, MD    Physical Exam: Vitals:   07/29/23 1214 07/29/23 1215 07/29/23 1230 07/29/23  1400  BP:  (!) 187/83 (!) 180/76 (!) 134/120  Pulse:  (!) 47 (!) 46 (!) 48  Resp:  19 15 14   SpO2: 99% 100% 99% 100%  Weight: 86.2 kg     Height: 5\' 6"  (1.676 m)      Physical Exam Vitals and nursing note reviewed.  Constitutional:      Appearance: He is ill-appearing (Chronically).  HENT:     Head: Normocephalic.     Comments: Healing bruise around left eye    Mouth/Throat:     Mouth: Mucous membranes are dry.     Pharynx: Oropharynx is clear.     Comments: Small papule on left side of the tongue Cardiovascular:     Rate and Rhythm: Regular rhythm. Bradycardia present.  Pulmonary:     Effort: Pulmonary effort is normal. No respiratory distress.     Breath sounds: Normal breath sounds. No wheezing or rales.  Abdominal:     General: Bowel sounds are normal.     Palpations: Abdomen is soft.  Musculoskeletal:     Comments: Trace bilateral pitting edema  Skin:    General: Skin is warm and dry.  Neurological:     Mental Status: He is alert.     Comments:  Patient is alert and oriented to time, place, person and somewhat situation.  4 out of 5 strength throughout Bilateral asterixis  Psychiatric:     Comments: Behavior seems withdrawn with delayed speech and repeating of words multiple times.  Seems to struggle to answer questions    Data Reviewed: CBC with WBC 2.5, hemoglobin of 13.2, platelets of 75 CMP with sodium of 138, potassium 3.9, bicarb 25, glucose 119, BUN 20, creatinine 1.36, albumin 2.6, AST 73, ALT 34 GFR of 55 Ammonia 98 Lactic acid 1.8 Urinalysis with glucosuria and rare bacteria only  Sinus rhythm with borderline first-degree AV block.  No ST or T wave changes concerning for acute ischemia.  Compared to EKG obtained 1 month prior, no changes noted.  DG Chest Port 1 View  Result Date: 07/29/2023 CLINICAL DATA:  Altered mental status since yesterday. Unable to stand. Left arm weakness. Fell yesterday with  head trauma EXAM: PORTABLE CHEST 1 VIEW COMPARISON:   06/16/2023 FINDINGS: Stable cardiomediastinal silhouette. Aortic atherosclerotic calcification. No focal consolidation, pleural effusion, or pneumothorax. No displaced rib fractures. IMPRESSION: No active disease. Electronically Signed   By: Minerva Fester M.D.   On: 07/29/2023 15:45   CT Head Wo Contrast  Result Date: 07/29/2023 CLINICAL DATA:  Altered mental status, left arm weakness, fall EXAM: CT HEAD WITHOUT CONTRAST TECHNIQUE: Contiguous axial images were obtained from the base of the skull through the vertex without intravenous contrast. RADIATION DOSE REDUCTION: This exam was performed according to the departmental dose-optimization program which includes automated exposure control, adjustment of the mA and/or kV according to patient size and/or use of iterative reconstruction technique. COMPARISON:  06/17/2023 FINDINGS: Brain: No evidence of acute infarction, hemorrhage, mass, mass effect, or midline shift. No hydrocephalus or extra-axial fluid collection. Vascular: No hyperdense vessel. Skull: Negative for fracture or focal lesion. Sinuses/Orbits: Minimal mucosal thickening in the left maxillary sinus and ethmoid air cells. No acute finding in the orbits. Other: The mastoid air cells are well aerated. IMPRESSION: No acute intracranial process. Electronically Signed   By: Wiliam Ke M.D.   On: 07/29/2023 13:08    There are no new results to review at this time.  Assessment and Plan:  * Acute hepatic encephalopathy (HCC) Patient is presenting with altered mental status and asterixis in the setting of hepatic encephalopathy due to discontinuation of lactulose at facility.  - Restart home lactulose - Titrate to 2-3 bowel movements per day - Restart home rifaximin - Neurochecks every 4 hours - PT/OT given frequent falls - Dysphagia diet until improvement in mentation - Aspiration and fall precautions  AKI (acute kidney injury) (HCC) In the setting of poor p.o. intake reported by  facility, likely due to hepatic encephalopathy.  - Small LR bolus - Repeat BMP in the a.m. - Hold home nephrotoxic agents for today  Sinus bradycardia Long-term history of a stable bradycardia, sinus rhythm on examination today.  Although occasionally labeled as junctional rhythm, P waves are present and consistent.  Given patient is otherwise asymptomatic, no need for continued telemetry monitoring  Liver cirrhosis (HCC) History of decompensated cirrhosis in the setting of active hepatitis C.  Complicated by acute on chronic encephalopathy.  - Hold home Lasix and spironolactone in the setting of AKI - Resume upon resolution of AKI  Pancytopenia (HCC) Stable at this time with no evidence of bleeding on examination.  - Repeat CBC in the a.m.  COPD (chronic obstructive pulmonary disease) (HCC) - DuoNebs as needed  HTN (hypertension) - Resume home regimen, with the exception of losartan given AKI  Chronic diastolic CHF (congestive heart failure) (HCC) Patient appears hypovolemic on examination.  - Hold home diuretics in the setting of AKI - Daily weights  Diabetes mellitus without complication (HCC) - SSI, sensitive - Hold home Jardiance  Advance Care Planning:   Code Status: Full Code   Consults: None  Family Communication: No family at bedside.  Patient states there is no one he would like me to call to update.  Severity of Illness: The appropriate patient status for this patient is OBSERVATION. Observation status is judged to be reasonable and necessary in order to provide the required intensity of service to ensure the patient's safety. The patient's presenting symptoms, physical exam findings, and initial radiographic and laboratory data in the context of their medical condition is felt to place them at decreased risk for further clinical deterioration. Furthermore, it is anticipated  that the patient will be medically stable for discharge from the hospital within 2  midnights of admission.   Author: Verdene Lennert, MD 07/29/2023 4:48 PM  For on call review [email or web address].

## 2023-07-29 NOTE — ED Provider Notes (Signed)
[facility] Provider Note    Event Date/Time   First MD Initiated Contact with Patient 07/29/23 1211     (approximate)   History   Weakness   HPI  [name] is a 72 y.o. male  who presents to the emergency department today with primary complaint of weakness.  Patient states that it is throughout his whole body.  He says that it started yesterday.  He denies any associated headache or chest pain.  Denies any associated fevers nausea or vomiting.  States he has been eating and drinking his normal amount.     Physical Exam   Triage Vital Signs: ED Triage Vitals  Encounter Vitals Group     BP 07/29/23 1215 (!) 187/83     Systolic BP Percentile --      Diastolic BP Percentile --      Pulse Rate 07/29/23 1215 (!) 47     Resp 07/29/23 1215 19     Temp --      Temp src --      SpO2 07/29/23 1214 99 %     Weight 07/29/23 1214 190 lb (86.2 kg)     Height 07/29/23 1214 5\' 6"  (1.676 m)     Head Circumference --      Peak Flow --      Pain Score 07/29/23 1214 0     Pain Loc --      Pain Education --      Exclude from Growth Chart --     Most recent vital signs: Vitals:   07/29/23 1215 07/29/23 1230  BP: (!) 187/83 (!) 180/76  Pulse: (!) 47 (!) 46  Resp: 19 15  SpO2: 100% 99%   General: Awake, alert, oriented. CV:  Good peripheral perfusion. Bradycardia Resp:  Normal effort. Lungs clear. Abd:  No distention.    ED Results / Procedures / Treatments   Labs (all labs ordered are listed, but only abnormal results are displayed) Labs Reviewed  COMPREHENSIVE METABOLIC PANEL - Abnormal; Notable for the following components:      Result Value   Glucose, Bld 119 (*)    Creatinine, Ser 1.36 (*)    Albumin 2.6 (*)    AST 73 (*)    Total Bilirubin 1.5 (*)    GFR, Estimated 55 (*)    All other components within normal limits  CBC WITH DIFFERENTIAL/PLATELET - Abnormal; Notable for the following components:   WBC 2.5 (*)    RBC 4.19 (*)     HCT 38.7 (*)    RDW 16.1 (*)    Platelets 75 (*)    Neutro Abs 1.1 (*)    All other components within normal limits  URINALYSIS, W/ REFLEX TO CULTURE (INFECTION SUSPECTED) - Abnormal; Notable for the following components:   Color, Urine YELLOW (*)    APPearance CLEAR (*)    Glucose, UA >=500 (*)    Bacteria, UA RARE (*)    All other components within normal limits  CULTURE, BLOOD (ROUTINE X 2)  CULTURE, BLOOD (ROUTINE X 2)  LACTIC ACID, PLASMA  LACTIC ACID, PLASMA     EKG  I, Phineas Semen, attending physician, personally viewed and interpreted this EKG  EKG Time: 1216 Rate: 53 Rhythm: sinus bradycardia Axis: normal Intervals: qtc 418 QRS: narrow ST changes: no st elevation Impression: abnormal ekg   RADIOLOGY I independently interpreted and visualized the CT head. My interpretation: No ICH Radiology interpretation:  IMPRESSION:  No acute  intracranial process.   I independently interpreted and visualized the CXR. My interpretation: No pneumonia Radiology interpretation:  IMPRESSION:  No active disease.      PROCEDURES:  Critical Care performed: No  MEDICATIONS ORDERED IN ED: Medications - No data to display   IMPRESSION / MDM / ASSESSMENT AND PLAN / ED COURSE  I reviewed the triage vital signs and the nursing notes.                              Differential diagnosis includes, but is not limited to, ICH, CVA, infection, electrolyte abnormality  Patient's presentation is most consistent with acute presentation with potential threat to life or bodily function.   The patient is on the cardiac monitor to evaluate for evidence of arrhythmia and/or significant heart rate changes.  Patient presented to the emergency department today from living facility because of concerns for altered mental status.  On exam patient is awake and alert.  Blood work does show leukopenia which has been found in the past.  No significant electrolyte abnormalities.  However  ammonia was ordered given history of cirrhosis.  This was elevated.  Patient does have asterixis.  Patient states he has not been taking any lactulose.  Will start lactulose.  Discussed with Dr. Huel Cote with the hospitalist service who will evaluate for admission.   FINAL CLINICAL IMPRESSION(S) / ED DIAGNOSES   Final diagnoses:  Hyperammonemia (HCC)     Note:  This document was prepared using Dragon voice recognition software and may include unintentional dictation errors.    Phineas Semen, MD 07/30/23 [phone]

## 2023-07-29 NOTE — Assessment & Plan Note (Addendum)
Patient appears hypovolemic on examination.  - Hold home diuretics in the setting of AKI - Daily weights

## 2023-07-29 NOTE — Assessment & Plan Note (Addendum)
Blood pressure mildly elevated. -Restarting home losartan as kidney functions are improving -Holding home losartan and spironolactone which can be resumed once renal function at baseline

## 2023-07-29 NOTE — Assessment & Plan Note (Signed)
 DuoNebs as needed

## 2023-07-29 NOTE — Assessment & Plan Note (Addendum)
Patient is presenting with altered mental status and asterixis in the setting of hepatic encephalopathy due to discontinuation of lactulose at facility. Mentation seems improving, peers to be close to baseline - Restart home lactulose - Titrate to 2-3 bowel movements per day - Restart home rifaximin - PT/OT given frequent falls - Dysphagia diet until improvement in mentation - Aspiration and fall precautions

## 2023-07-30 ENCOUNTER — Other Ambulatory Visit: Payer: Self-pay

## 2023-07-30 ENCOUNTER — Encounter: Payer: Self-pay | Admitting: Internal Medicine

## 2023-07-30 DIAGNOSIS — N179 Acute kidney failure, unspecified: Secondary | ICD-10-CM | POA: Diagnosis not present

## 2023-07-30 DIAGNOSIS — K7460 Unspecified cirrhosis of liver: Secondary | ICD-10-CM | POA: Diagnosis not present

## 2023-07-30 DIAGNOSIS — I498 Other specified cardiac arrhythmias: Secondary | ICD-10-CM

## 2023-07-30 DIAGNOSIS — E7220 Disorder of urea cycle metabolism, unspecified: Secondary | ICD-10-CM | POA: Diagnosis not present

## 2023-07-30 DIAGNOSIS — K7682 Hepatic encephalopathy: Secondary | ICD-10-CM | POA: Diagnosis not present

## 2023-07-30 LAB — CBC WITH DIFFERENTIAL/PLATELET
Abs Immature Granulocytes: 0 10*3/uL (ref 0.00–0.07)
Abs Immature Granulocytes: 0.01 10*3/uL (ref 0.00–0.07)
Basophils Absolute: 0 10*3/uL (ref 0.0–0.1)
Basophils Absolute: 0 10*3/uL (ref 0.0–0.1)
Basophils Relative: 1 %
Basophils Relative: 1 %
Eosinophils Absolute: 0.1 10*3/uL (ref 0.0–0.5)
Eosinophils Absolute: 0.1 10*3/uL (ref 0.0–0.5)
Eosinophils Relative: 4 %
Eosinophils Relative: 4 %
HCT: 35.9 % — ABNORMAL LOW (ref 39.0–52.0)
HCT: 36.3 % — ABNORMAL LOW (ref 39.0–52.0)
Hemoglobin: 12.4 g/dL — ABNORMAL LOW (ref 13.0–17.0)
Hemoglobin: 12.4 g/dL — ABNORMAL LOW (ref 13.0–17.0)
Immature Granulocytes: 0 %
Immature Granulocytes: 0 %
Lymphocytes Relative: 48 %
Lymphocytes Relative: 50 %
Lymphs Abs: 1.4 10*3/uL (ref 0.7–4.0)
Lymphs Abs: 1.4 10*3/uL (ref 0.7–4.0)
MCH: 31.2 pg (ref 26.0–34.0)
MCH: 31.2 pg (ref 26.0–34.0)
MCHC: 34.5 g/dL (ref 30.0–36.0)
MCHC: 34.2 g/dL (ref 30.0–36.0)
MCV: 90.4 fL (ref 80.0–100.0)
MCV: 91.2 fL (ref 80.0–100.0)
Monocytes Absolute: 0.4 10*3/uL (ref 0.1–1.0)
Monocytes Absolute: 0.4 10*3/uL (ref 0.1–1.0)
Monocytes Relative: 14 %
Monocytes Relative: 15 %
Neutro Abs: 0.9 10*3/uL — ABNORMAL LOW (ref 1.7–7.7)
Neutro Abs: 0.9 10*3/uL — ABNORMAL LOW (ref 1.7–7.7)
Neutrophils Relative %: 33 %
Neutrophils Relative %: 30 %
Platelets: 69 10*3/uL — ABNORMAL LOW (ref 150–400)
Platelets: 69 10*3/uL — ABNORMAL LOW (ref 150–400)
RBC: 3.97 MIL/uL — ABNORMAL LOW (ref 4.22–5.81)
RBC: 3.98 MIL/uL — ABNORMAL LOW (ref 4.22–5.81)
RDW: 16 % — ABNORMAL HIGH (ref 11.5–15.5)
RDW: 16 % — ABNORMAL HIGH (ref 11.5–15.5)
Smear Review: NORMAL
Smear Review: NORMAL
WBC: 2.9 10*3/uL — ABNORMAL LOW (ref 4.0–10.5)
WBC: 2.9 10*3/uL — ABNORMAL LOW (ref 4.0–10.5)
nRBC: 0 % (ref 0.0–0.2)
nRBC: 0 % (ref 0.0–0.2)

## 2023-07-30 LAB — GLUCOSE, CAPILLARY
Glucose-Capillary: 74 mg/dL (ref 70–99)
Glucose-Capillary: 177 mg/dL — ABNORMAL HIGH (ref 70–99)
Glucose-Capillary: 168 mg/dL — ABNORMAL HIGH (ref 70–99)
Glucose-Capillary: 92 mg/dL (ref 70–99)

## 2023-07-30 LAB — COMPREHENSIVE METABOLIC PANEL
ALT: 32 U/L (ref 0–44)
AST: 66 U/L — ABNORMAL HIGH (ref 15–41)
Albumin: 2.5 g/dL — ABNORMAL LOW (ref 3.5–5.0)
Alkaline Phosphatase: 88 U/L (ref 38–126)
Anion gap: 6 (ref 5–15)
BUN: 20 mg/dL (ref 8–23)
CO2: 24 mmol/L (ref 22–32)
Calcium: 9.5 mg/dL (ref 8.9–10.3)
Chloride: 107 mmol/L (ref 98–111)
Creatinine, Ser: 1.28 mg/dL — ABNORMAL HIGH (ref 0.61–1.24)
GFR, Estimated: 59 mL/min — ABNORMAL LOW (ref >60)
Glucose, Bld: 78 mg/dL (ref 70–99)
Potassium: 3.5 mmol/L (ref 3.5–5.1)
Sodium: 137 mmol/L (ref 135–145)
Total Bilirubin: 1.7 mg/dL — ABNORMAL HIGH (ref <1.2)
Total Protein: 6.5 g/dL (ref 6.5–8.1)

## 2023-07-30 MED ORDER — OXYCODONE HCL 5 MG PO TABS
5.0000 mg | ORAL_TABLET | ORAL | Status: DC | PRN
  Administered 2023-07-30 – 2023-08-01 (×4): 5 mg via ORAL
  Filled 2023-07-30 (×4): qty 1

## 2023-07-30 MED ORDER — LOSARTAN POTASSIUM 50 MG PO TABS
50.0000 mg | ORAL_TABLET | Freq: Every day | ORAL | Status: DC
  Administered 2023-07-30 – 2023-08-04 (×6): 50 mg via ORAL
  Filled 2023-07-30 (×6): qty 1
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
## 2023-07-30 NOTE — Progress Notes (Signed)
Progress Note   Patient: [name] ZOX:[id number] DOB: 1950/10/18 DOA: 07/29/2023     0 DOS: the patient was seen and examined on 07/30/2023   Brief hospital course: Taken from H&P.  MCLEAN JABBOUR is a 72 y.o. 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   Apparently patient was not taking his lactulose over the past 1 month because of getting diarrhea with that.  Progressive generalized weakness.  On arrival to the ED, patient was hypertensive at 180/76 with heart rate of 51. He was saturating at 99% on room air. He is afebrile. Initial workup notable for WBC of 2.5, hemoglobin of 13.2, platelets of 75, creatinine 0.36 with GFR 55 and ammonia of 98. Lactic acid 1.8. Urinalysis with glucosuria and rare bacteria but no leukocytes, nitrites or WBCs/hpf. CT of the head was obtained with no acute intracranial abnormality. Chest x-ray was obtained with no active disease. Patient started on lactulose.  12/3: Hemodynamically stable, mentation improved after having BM. Continue to feel very weak stating that he cannot walk.  He does not want to go back to his group home stating that they are not taking care of him.  Patient with multiple life limiting comorbidities, palliative care was also consulted.      Assessment and Plan: * Acute hepatic encephalopathy (HCC) Patient is presenting with altered mental status and asterixis in the setting of hepatic encephalopathy due to discontinuation of lactulose at facility. Mentation seems improving, peers to be close to baseline - Restart home lactulose - Titrate to 2-3 bowel movements per day - Restart home rifaximin - PT/OT given frequent falls - Dysphagia diet until improvement in mentation - Aspiration and  fall precautions  AKI (acute kidney injury) (HCC) In the setting of poor p.o. intake reported by facility, likely due to hepatic encephalopathy. Slowly improving. -Received some IV fluid -Monitor renal function -Avoid nephrotoxins  Sinus bradycardia Long-term history of a stable bradycardia, sinus rhythm on examination today.  Although occasionally labeled as junctional rhythm, P waves are present and consistent.  Given patient is otherwise asymptomatic, no need for continued telemetry monitoring  Liver cirrhosis (HCC) History of decompensated cirrhosis in the setting of active hepatitis C.  Complicated by acute on chronic encephalopathy.  - Hold home Lasix and spironolactone in the setting of AKI - Resume upon resolution of AKI  Pancytopenia (HCC) Stable at this time with no evidence of bleeding on examination.  - Repeat CBC in the a.m.  COPD (chronic obstructive pulmonary disease) (HCC) - DuoNebs as needed  HTN (hypertension) Blood pressure mildly elevated. -Restarting home losartan as kidney functions are improving -Holding home losartan and spironolactone which can be resumed once renal function at baseline  Chronic diastolic CHF (congestive heart failure) (HCC) Patient appears hypovolemic on examination.  - Hold home diuretics in the setting of AKI - Daily weights  Diabetes mellitus without complication (HCC) - SSI, sensitive - Hold home Jardiance   Subjective: Patient was sitting in chair when seen today.  Denies any pain, nausea or vomiting.  Had a bowel movement.  Able to answer questions appropriately, he does not want to go back to his group home stating that they are not taking care of him and they were not giving him lactulose.  Physical Exam: Vitals:   07/30/23  0500 07/30/23 0520 07/30/23 0627 07/30/23 0758  BP: 94/66  (!) 169/81 (!) 149/75  Pulse: (!) 43  (!) 43 (!) 44  Resp: 13   17  Temp:  99.1 F (37.3 C) (!) 97.5 F (36.4 C) 97.6 F (36.4 C)   TempSrc:  Oral Oral   SpO2: 100%  100% 100%  Weight:      Height:       General.  Frail elderly man, in no acute distress. Pulmonary.  Lungs clear bilaterally, normal respiratory effort. CV.  Regular rate and rhythm, no JVD, rub or murmur. Abdomen.  Soft, nontender, nondistended, BS positive. CNS.  Alert and oriented .  No focal neurologic deficit. Extremities.  No edema, no cyanosis, pulses intact and symmetrical. .   Data Reviewed: Prior data reviewed  Family Communication: No family at bedside, patient does not want me to call anyone.  Disposition: Status is: Observation The patient remains OBS appropriate and will d/c before 2 midnights.  Planned Discharge Destination:  To be determined  Time spent: 50 minutes  This record has been created using Conservation officer, historic buildings. Errors have been sought and corrected,but may not always be located. Such creation errors do not reflect on the standard of care.   Author: Arnetha Courser, MD 07/30/2023 1:07 PM  For on call review [email or web address].

## 2023-07-30 NOTE — Hospital Course (Addendum)
Taken from H&P.  [name] is a 72 y.o. 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   Apparently patient was not taking his lactulose over the past 1 month because of getting diarrhea with that.  Progressive generalized weakness.  On arrival to the ED, patient was hypertensive at 180/76 with heart rate of 51. He was saturating at 99% on room air. He is afebrile. Initial workup notable for WBC of 2.5, hemoglobin of 13.2, platelets of 75, creatinine 0.36 with GFR 55 and ammonia of 98. Lactic acid 1.8. Urinalysis with glucosuria and rare bacteria but no leukocytes, nitrites or WBCs/hpf. CT of the head was obtained with no acute intracranial abnormality. Chest x-ray was obtained with no active disease. Patient started on lactulose.  12/3: Hemodynamically stable, mentation improved after having BM. Continue to feel very weak stating that he cannot walk.  He does not want to go back to his group home stating that they are not taking care of him.  Patient with multiple life limiting comorbidities, palliative care was also consulted.  12/4: Vital stable with borderline bradycardia, labs with mild worsening of pancytopenia, slight increase in creatinine to 1.33.  Encourage p.o. hydration.  PT is recommending SNF.  TOC was consulted for placement.  12/5: Vital stable, increased somnolence, worsening of creatinine at 1.41 and ammonia at 182, increasing the dose of lactulose.  PT recommendations changed to home health.  Stable pancytopenia.  Also giving some IV fluid.  12/6: Little more alert and awake today.  Wants to go back to his group home.  They will take him back on Monday.  Having 2-3 bowel movements

## 2023-07-31 DIAGNOSIS — K7460 Unspecified cirrhosis of liver: Secondary | ICD-10-CM | POA: Diagnosis not present

## 2023-07-31 DIAGNOSIS — E7220 Disorder of urea cycle metabolism, unspecified: Secondary | ICD-10-CM | POA: Diagnosis not present

## 2023-07-31 DIAGNOSIS — N179 Acute kidney failure, unspecified: Secondary | ICD-10-CM | POA: Diagnosis not present

## 2023-07-31 DIAGNOSIS — K7682 Hepatic encephalopathy: Secondary | ICD-10-CM | POA: Diagnosis not present

## 2023-07-31 LAB — BASIC METABOLIC PANEL
Anion gap: 5 (ref 5–15)
BUN: 23 mg/dL (ref 8–23)
CO2: 24 mmol/L (ref 22–32)
Calcium: 9.1 mg/dL (ref 8.9–10.3)
Chloride: 107 mmol/L (ref 98–111)
Creatinine, Ser: 1.33 mg/dL — ABNORMAL HIGH (ref 0.61–1.24)
GFR, Estimated: 56 mL/min — ABNORMAL LOW (ref >60)
Glucose, Bld: 108 mg/dL — ABNORMAL HIGH (ref 70–99)
Potassium: 3.6 mmol/L (ref 3.5–5.1)
Sodium: 136 mmol/L (ref 135–145)

## 2023-07-31 LAB — GLUCOSE, CAPILLARY
Glucose-Capillary: 83 mg/dL (ref 70–99)
Glucose-Capillary: 111 mg/dL — ABNORMAL HIGH (ref 70–99)
Glucose-Capillary: 117 mg/dL — ABNORMAL HIGH (ref 70–99)
Glucose-Capillary: 96 mg/dL (ref 70–99)

## 2023-07-31 LAB — CBC
HCT: 32.1 % — ABNORMAL LOW (ref 39.0–52.0)
Hemoglobin: 11.1 g/dL — ABNORMAL LOW (ref 13.0–17.0)
MCH: 31.2 pg (ref 26.0–34.0)
MCHC: 34.6 g/dL (ref 30.0–36.0)
MCV: 90.2 fL (ref 80.0–100.0)
Platelets: 61 10*3/uL — ABNORMAL LOW (ref 150–400)
RBC: 3.56 MIL/uL — ABNORMAL LOW (ref 4.22–5.81)
RDW: 16.1 % — ABNORMAL HIGH (ref 11.5–15.5)
WBC: 2.6 10*3/uL — ABNORMAL LOW (ref 4.0–10.5)
nRBC: 0 % (ref 0.0–0.2)

## 2023-07-31 NOTE — Progress Notes (Signed)
Progress Note   Patient: [name] NWG:[id number] DOB: 1951-01-16 DOA: 07/29/2023     0 DOS: the patient was seen and examined on 07/31/2023   Brief hospital course: Taken from H&P.  [name] is a 72 y.o. 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   Apparently patient was not taking his lactulose over the past 1 month because of getting diarrhea with that.  Progressive generalized weakness.  On arrival to the ED, patient was hypertensive at 180/76 with heart rate of 51. He was saturating at 99% on room air. He is afebrile. Initial workup notable for WBC of 2.5, hemoglobin of 13.2, platelets of 75, creatinine 0.36 with GFR 55 and ammonia of 98. Lactic acid 1.8. Urinalysis with glucosuria and rare bacteria but no leukocytes, nitrites or WBCs/hpf. CT of the head was obtained with no acute intracranial abnormality. Chest x-ray was obtained with no active disease. Patient started on lactulose.  12/3: Hemodynamically stable, mentation improved after having BM. Continue to feel very weak stating that he cannot walk.  He does not want to go back to his group home stating that they are not taking care of him.  Patient with multiple life limiting comorbidities, palliative care was also consulted.  12/4: Vital stable with borderline bradycardia, labs with mild worsening of pancytopenia, slight increase in creatinine to 1.33.  Encourage p.o. hydration.  PT is recommending SNF.  TOC was consulted for placement.      Assessment and Plan: * Acute hepatic encephalopathy (HCC) Patient is presenting with altered mental status and asterixis in the setting of hepatic encephalopathy due to discontinuation of lactulose at facility. Mentation seems improving,  peers to be close to baseline - Restart home lactulose - Titrate to 2-3 bowel movements per day - Restart home rifaximin - PT/OT given frequent falls - Dysphagia diet until improvement in mentation - Aspiration and fall precautions  AKI (acute kidney injury) (HCC) In the setting of poor p.o. intake reported by facility, likely due to hepatic encephalopathy. Creatinine at 1.33 today. -Received some IV fluid -Encourage p.o. hydration and if remained elevated might need some IV fluid -Monitor renal function -Avoid nephrotoxins  Sinus bradycardia Long-term history of a stable bradycardia, sinus rhythm on examination today.  Although occasionally labeled as junctional rhythm, P waves are present and consistent.  Given patient is otherwise asymptomatic, no need for continued telemetry monitoring  Liver cirrhosis (HCC) History of decompensated cirrhosis in the setting of active hepatitis C.  Complicated by acute on chronic encephalopathy.  - Hold home Lasix and spironolactone in the setting of AKI - Resume upon resolution of AKI  Pancytopenia (HCC) Stable at this time with no evidence of bleeding on examination.  - Repeat CBC in the a.m.  COPD (chronic obstructive pulmonary disease) (HCC) - DuoNebs as needed  HTN (hypertension) Blood pressure mildly elevated. -Restarting home losartan as kidney functions are improving -Holding home losartan and spironolactone which can be resumed once renal function at baseline  Chronic diastolic CHF (congestive heart failure) (HCC) Patient appears hypovolemic on examination.  - Hold home diuretics in the setting of AKI - Daily weights  Diabetes mellitus without complication (HCC) - SSI, sensitive - Hold home Jardiance   Subjective: Patient continued to feel very weak and unable to walk independently.  Having bowel movement.  Complaining of some pain on NT at night at the recent surgical site, no apparent sign of inflammation.  Physical  Exam: Vitals:   07/30/23 0758 07/30/23 1435 07/30/23 2217 07/31/23 0813  BP: (!) 149/75 (!) 105/58 (!) 102/49 (!) 144/70  Pulse: (!) 44 (!) 48 (!) 52 (!) 46  Resp: 17 16 17 18   Temp: 97.6 F (36.4 C) 98.1 F (36.7 C) 98.1 F (36.7 C) 97.7 F (36.5 C)  TempSrc:      SpO2: 100% 100% 99% 100%  Weight:      Height:       General.  Frail elderly man, in no acute distress. Pulmonary.  Lungs clear bilaterally, normal respiratory effort. CV.  Regular rate and rhythm, no JVD, rub or murmur. Abdomen.  Soft, nontender, nondistended, BS positive. CNS.  Alert and oriented .  No focal neurologic deficit. Extremities.  No edema, no cyanosis, pulses intact and symmetrical.   Data Reviewed: Prior data reviewed  Family Communication: No family at bedside, patient does not want me to call anyone.  Disposition: Status is: Observation The patient remains OBS appropriate and will d/c before 2 midnights.  Planned Discharge Destination:  To be determined  Time spent: 40 minutes  This record has been created using Conservation officer, historic buildings. Errors have been sought and corrected,but may not always be located. Such creation errors do not reflect on the standard of care.   Author: Arnetha Courser, MD 07/31/2023 9:54 AM  For on call review [email or web address].

## 2023-07-31 NOTE — Assessment & Plan Note (Addendum)
Long-term history of a stable bradycardia.   EKG 53 bpm, normal axis, normal intervals, atrial rhythm with PVC, with no significant ST segment or T wave changes.  Continue close monitoring.

## 2023-07-31 NOTE — Assessment & Plan Note (Addendum)
Follow up cell count with wbc 2,7, hgb 9,8 and plt 52 Likely due to liver disease.

## 2023-07-31 NOTE — Assessment & Plan Note (Addendum)
Renal function with serum cr at 1,1 with K at 4,0 and serum bicarbonate at 25  Na 135   Plan to continue lactulose.

## 2023-07-31 NOTE — Plan of Care (Signed)
Pt is alert and oriented x 3. Confusion to time. Med compliant. Vitals stable. Denies pain. No prns given Problem: Education: Goal: Ability to describe self-care measures that may prevent or decrease complications (Diabetes Survival Skills Education) will improve Outcome: Progressing Goal: Individualized Educational Video(s) Outcome: Progressing   Problem: Coping: Goal: Ability to adjust to condition or change in health will improve Outcome: Progressing   Problem: Fluid Volume: Goal: Ability to maintain a balanced intake and output will improve Outcome: Progressing   Problem: Health Behavior/Discharge Planning: Goal: Ability to identify and utilize available resources and services will improve Outcome: Progressing Goal: Ability to manage health-related needs will improve Outcome: Progressing   Problem: Metabolic: Goal: Ability to maintain appropriate glucose levels will improve Outcome: Progressing   Problem: Nutritional: Goal: Maintenance of adequate nutrition will improve Outcome: Progressing Goal: Progress toward achieving an optimal weight will improve Outcome: Progressing   Problem: Skin Integrity: Goal: Risk for impaired skin integrity will decrease Outcome: Progressing   Problem: Tissue Perfusion: Goal: Adequacy of tissue perfusion will improve Outcome: Progressing   Problem: Education: Goal: Knowledge of General Education information will improve Description: Including pain rating scale, medication(s)/side effects and non-pharmacologic comfort measures Outcome: Progressing   Problem: Health Behavior/Discharge Planning: Goal: Ability to manage health-related needs will improve Outcome: Progressing   Problem: Clinical Measurements: Goal: Ability to maintain clinical measurements within normal limits will improve Outcome: Progressing Goal: Will remain free from infection Outcome: Progressing Goal: Diagnostic test results will improve Outcome:  Progressing Goal: Respiratory complications will improve Outcome: Progressing Goal: Cardiovascular complication will be avoided Outcome: Progressing   Problem: Activity: Goal: Risk for activity intolerance will decrease Outcome: Progressing   Problem: Nutrition: Goal: Adequate nutrition will be maintained Outcome: Progressing   Problem: Coping: Goal: Level of anxiety will decrease Outcome: Progressing   Problem: Elimination: Goal: Will not experience complications related to bowel motility Outcome: Progressing Goal: Will not experience complications related to urinary retention Outcome: Progressing   Problem: Pain Management: Goal: General experience of comfort will improve Outcome: Progressing   Problem: Safety: Goal: Ability to remain free from injury will improve Outcome: Progressing   Problem: Skin Integrity: Goal: Risk for impaired skin integrity will decrease Outcome: Progressing

## 2023-07-31 NOTE — Progress Notes (Signed)
Physical Therapy Treatment Patient Details Name: DEADRICK VERDUGO MRN: [id number] DOB: 07-15-1951 Today's Date: 07/31/2023   History of Present Illness 72 y.o. 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    PT Comments  Pt received in recliner and agreed to PT session. Pt reported pain to be on a scale of 6/10 in his neck accompanied with a migraine. Pt performed STS with the use of RW (2wheels) SUP and amb ~73ft CGA prior to ending session in recliner due to fatigue with NT in room. VC necessary throughout session for RW management. Communication via Secure chat with TOC occurred to discuss change in D/C disposition. Pt tolerated Tx well and will continue to benefit from skilled PT sessions to improve strength, activity tolerance, and functional mobility to maximize safety/return to PLOF following D/C.    If plan is discharge home, recommend the following: A little help with walking and/or transfers;Assist for transportation;Help with stairs or ramp for entrance   Can travel by private vehicle     No  Equipment Recommendations  BSC/3in1    Recommendations for Other Services       Precautions / Restrictions Precautions Precautions: Fall Restrictions Weight Bearing Restrictions: No     Mobility  Bed Mobility               General bed mobility comments: Pt received in recliner, bed mobility not observed    Transfers Overall transfer level: Needs assistance Equipment used: Rolling walker (2 wheels) Transfers: Sit to/from Stand Sit to Stand: Supervision           General transfer comment: Pt performed STS with the use of RW (2wheels) SUP. VC necessary for RW management. Pt did not report dizziness when  standing.    Ambulation/Gait Ambulation/Gait assistance: Contact guard assist Gait Distance (Feet): 70 Feet Assistive device: Rolling walker (2 wheels) Gait Pattern/deviations: Step-to pattern, Antalgic Gait velocity: decreased     General Gait Details: Pt amb with the use of RW (2wheels) CGA in hallway. VC necessary for RW management. Due to fatigue, pt requested to return back to room.   Stairs             Wheelchair Mobility     Tilt Bed    Modified Rankin (Stroke Patients Only)       Balance Overall balance assessment: Needs assistance Sitting-balance support: No upper extremity supported, Single extremity supported, Feet supported Sitting balance-Leahy Scale: Fair     Standing balance support: Bilateral upper extremity supported, During functional activity Standing balance-Leahy Scale: Fair                              Cognition Arousal: Alert Behavior During Therapy: WFL for tasks assessed/performed, Impulsive Overall Cognitive Status: No family/caregiver present to determine baseline cognitive functioning                                 General Comments: Pt pleasant and willing to participate in PT session.        Exercises      General Comments        Pertinent Vitals/Pain Pain Assessment Pain Assessment: No/denies pain  Pain Score: 6  Pain Location: Neck and pt reports having a migraine Pain Descriptors / Indicators: Aching, Dull, Constant Pain Intervention(s): Monitored during session    Home Living                          Prior Function            PT Goals (current goals can now be found in the care plan section) Acute Rehab PT Goals Patient Stated Goal: To go home PT Goal Formulation: With patient Time For Goal Achievement: 08/13/23 Potential to Achieve Goals: Good Progress towards PT goals: Progressing toward goals    Frequency    Min 1X/week      PT Plan      Co-evaluation               AM-PAC PT "6 Clicks" Mobility   Outcome Measure  Help needed turning from your back to your side while in a flat bed without using bedrails?: None Help needed moving from lying on your back to sitting on the side of a flat bed without using bedrails?: A Little Help needed moving to and from a bed to a chair (including a wheelchair)?: A Little Help needed standing up from a chair using your arms (e.g., wheelchair or bedside chair)?: A Little Help needed to walk in hospital room?: A Little Help needed climbing 3-5 steps with a railing? : A Lot 6 Click Score: 18    End of Session Equipment Utilized During Treatment: Gait belt Activity Tolerance: Patient tolerated treatment well;Patient limited by fatigue Patient left: in chair;with call bell/phone within reach;with bed alarm set;with nursing/sitter in room Nurse Communication: Mobility status PT Visit Diagnosis: Unsteadiness on feet (R26.81);Other abnormalities of gait and mobility (R26.89);Muscle weakness (generalized) (M62.81);Difficulty in walking, not elsewhere classified (R26.2);Pain     Time: 6045-4098 PT Time Calculation (min) (ACUTE ONLY): 17 min  Charges:    $Gait Training: 8-22 mins PT General Charges $$ ACUTE PT VISIT: 1 Visit                     Rakwon Letourneau Sauvignon Howard SPT, LAT, ATC  Glynda Soliday Sauvignon-Howard 07/31/2023, 4:04 PM

## 2023-07-31 NOTE — Plan of Care (Signed)

## 2023-08-01 DIAGNOSIS — I5032 Chronic diastolic (congestive) heart failure: Secondary | ICD-10-CM | POA: Diagnosis present

## 2023-08-01 DIAGNOSIS — I2510 Atherosclerotic heart disease of native coronary artery without angina pectoris: Secondary | ICD-10-CM | POA: Diagnosis present

## 2023-08-01 DIAGNOSIS — J439 Emphysema, unspecified: Secondary | ICD-10-CM | POA: Diagnosis not present

## 2023-08-01 DIAGNOSIS — G9341 Metabolic encephalopathy: Secondary | ICD-10-CM | POA: Diagnosis present

## 2023-08-01 DIAGNOSIS — Z5900 Homelessness unspecified: Secondary | ICD-10-CM | POA: Diagnosis not present

## 2023-08-01 DIAGNOSIS — F32A Depression, unspecified: Secondary | ICD-10-CM | POA: Diagnosis present

## 2023-08-01 DIAGNOSIS — J4489 Other specified chronic obstructive pulmonary disease: Secondary | ICD-10-CM | POA: Diagnosis present

## 2023-08-01 DIAGNOSIS — I110 Hypertensive heart disease with heart failure: Secondary | ICD-10-CM | POA: Diagnosis present

## 2023-08-01 DIAGNOSIS — E7220 Disorder of urea cycle metabolism, unspecified: Secondary | ICD-10-CM | POA: Diagnosis present

## 2023-08-01 DIAGNOSIS — K7460 Unspecified cirrhosis of liver: Secondary | ICD-10-CM | POA: Diagnosis present

## 2023-08-01 DIAGNOSIS — K7682 Hepatic encephalopathy: Secondary | ICD-10-CM | POA: Diagnosis present

## 2023-08-01 DIAGNOSIS — E785 Hyperlipidemia, unspecified: Secondary | ICD-10-CM | POA: Diagnosis present

## 2023-08-01 DIAGNOSIS — Z8249 Family history of ischemic heart disease and other diseases of the circulatory system: Secondary | ICD-10-CM | POA: Diagnosis not present

## 2023-08-01 DIAGNOSIS — D61818 Other pancytopenia: Secondary | ICD-10-CM | POA: Diagnosis present

## 2023-08-01 DIAGNOSIS — Z7984 Long term (current) use of oral hypoglycemic drugs: Secondary | ICD-10-CM | POA: Diagnosis not present

## 2023-08-01 DIAGNOSIS — E861 Hypovolemia: Secondary | ICD-10-CM | POA: Diagnosis present

## 2023-08-01 DIAGNOSIS — F17210 Nicotine dependence, cigarettes, uncomplicated: Secondary | ICD-10-CM | POA: Diagnosis present

## 2023-08-01 DIAGNOSIS — R001 Bradycardia, unspecified: Secondary | ICD-10-CM | POA: Diagnosis not present

## 2023-08-01 DIAGNOSIS — K766 Portal hypertension: Secondary | ICD-10-CM | POA: Diagnosis present

## 2023-08-01 DIAGNOSIS — Z8616 Personal history of COVID-19: Secondary | ICD-10-CM | POA: Diagnosis not present

## 2023-08-01 DIAGNOSIS — I700 Atherosclerosis of aorta: Secondary | ICD-10-CM | POA: Diagnosis present

## 2023-08-01 DIAGNOSIS — Z515 Encounter for palliative care: Secondary | ICD-10-CM | POA: Diagnosis not present

## 2023-08-01 DIAGNOSIS — I272 Pulmonary hypertension, unspecified: Secondary | ICD-10-CM | POA: Diagnosis present

## 2023-08-01 DIAGNOSIS — N179 Acute kidney failure, unspecified: Secondary | ICD-10-CM | POA: Diagnosis present

## 2023-08-01 DIAGNOSIS — F4310 Post-traumatic stress disorder, unspecified: Secondary | ICD-10-CM | POA: Diagnosis present

## 2023-08-01 DIAGNOSIS — R296 Repeated falls: Secondary | ICD-10-CM | POA: Diagnosis present

## 2023-08-01 DIAGNOSIS — Z8673 Personal history of transient ischemic attack (TIA), and cerebral infarction without residual deficits: Secondary | ICD-10-CM | POA: Diagnosis not present

## 2023-08-01 DIAGNOSIS — E1169 Type 2 diabetes mellitus with other specified complication: Secondary | ICD-10-CM | POA: Diagnosis present

## 2023-08-01 LAB — COMPREHENSIVE METABOLIC PANEL
ALT: 33 U/L (ref 0–44)
AST: 59 U/L — ABNORMAL HIGH (ref 15–41)
Albumin: 2.3 g/dL — ABNORMAL LOW (ref 3.5–5.0)
Alkaline Phosphatase: 89 U/L (ref 38–126)
Anion gap: 7 (ref 5–15)
BUN: 22 mg/dL (ref 8–23)
CO2: 24 mmol/L (ref 22–32)
Calcium: 9.3 mg/dL (ref 8.9–10.3)
Chloride: 106 mmol/L (ref 98–111)
Creatinine, Ser: 1.41 mg/dL — ABNORMAL HIGH (ref 0.61–1.24)
GFR, Estimated: 52 mL/min — ABNORMAL LOW (ref >60)
Glucose, Bld: 104 mg/dL — ABNORMAL HIGH (ref 70–99)
Potassium: 4.3 mmol/L (ref 3.5–5.1)
Sodium: 137 mmol/L (ref 135–145)
Total Bilirubin: 1.2 mg/dL — ABNORMAL HIGH (ref <1.2)
Total Protein: 5.8 g/dL — ABNORMAL LOW (ref 6.5–8.1)

## 2023-08-01 LAB — CBC
HCT: 33.6 % — ABNORMAL LOW (ref 39.0–52.0)
Hemoglobin: 11.6 g/dL — ABNORMAL LOW (ref 13.0–17.0)
MCH: 30.7 pg (ref 26.0–34.0)
MCHC: 34.5 g/dL (ref 30.0–36.0)
MCV: 88.9 fL (ref 80.0–100.0)
Platelets: 62 10*3/uL — ABNORMAL LOW (ref 150–400)
RBC: 3.78 MIL/uL — ABNORMAL LOW (ref 4.22–5.81)
RDW: 15.9 % — ABNORMAL HIGH (ref 11.5–15.5)
WBC: 2.6 10*3/uL — ABNORMAL LOW (ref 4.0–10.5)
nRBC: 0 % (ref 0.0–0.2)

## 2023-08-01 LAB — GLUCOSE, CAPILLARY
Glucose-Capillary: 113 mg/dL — ABNORMAL HIGH (ref 70–99)
Glucose-Capillary: 96 mg/dL (ref 70–99)
Glucose-Capillary: 108 mg/dL — ABNORMAL HIGH (ref 70–99)
Glucose-Capillary: 85 mg/dL (ref 70–99)

## 2023-08-01 LAB — AMMONIA: Ammonia: 182 umol/L — ABNORMAL HIGH (ref 9–35)

## 2023-08-01 LAB — PROTIME-INR
INR: 1.2 (ref 0.8–1.2)
Prothrombin Time: 15.8 s — ABNORMAL HIGH (ref 11.4–15.2)

## 2023-08-01 MED ORDER — ENOXAPARIN SODIUM 40 MG/0.4ML IJ SOSY
40.0000 mg | PREFILLED_SYRINGE | Freq: Every day | INTRAMUSCULAR | Status: DC
  Administered 2023-08-01 – 2023-08-04 (×4): 40 mg via SUBCUTANEOUS
  Filled 2023-08-01 (×4): qty 0.4

## 2023-08-01 MED ORDER — LACTULOSE 10 GM/15ML PO SOLN
30.0000 g | Freq: Two times a day (BID) | ORAL | Status: DC
  Filled 2023-08-01: qty 60

## 2023-08-01 MED ORDER — LACTATED RINGERS IV SOLN: INTRAVENOUS | Status: AC

## 2023-08-01 MED ORDER — LACTULOSE 10 GM/15ML PO SOLN
30.0000 g | Freq: Three times a day (TID) | ORAL | Status: DC
  Administered 2023-08-01 – 2023-08-05 (×11): 30 g via ORAL
  Filled 2023-08-01 (×12): qty 60

## 2023-08-01 NOTE — Progress Notes (Signed)
Occupational Therapy Treatment Patient Details Name: [name] MRN: [id number] DOB: November 06, 1950 Today's Date: 08/01/2023   History of present illness 72 y.o. 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   OT comments  Pt received semi-reclined in bed; c/o ongoing migraine, but willing to work with OT. Appearing tired; willing to work with OT on grooming at the sink. T/f CGA with RW. See flowsheet below for further details of session. Left semi-reclined in bed, alarm on, lights off, with all needs in reach.  Patient will benefit from continued OT while in acute care.       If plan is discharge home, recommend the following:  A little help with walking and/or transfers;A little help with bathing/dressing/bathroom;Assistance with cooking/housework;Direct supervision/assist for medications management;Direct supervision/assist for financial management;Assist for transportation;Help with stairs or ramp for entrance   Equipment Recommendations  BSC/3in1    Recommendations for Other Services      Precautions / Restrictions Precautions Precautions: Fall Restrictions Weight Bearing Restrictions: No       Mobility Bed Mobility Overal bed mobility: Needs Assistance Bed Mobility: Supine to Sit, Sit to Supine     Supine to sit: Supervision Sit to supine: Supervision   General bed mobility comments: extra time needed    Transfers Overall transfer level: Needs assistance Equipment used: Rolling walker (2 wheels) Transfers: Sit to/from Stand Sit to Stand: Contact guard assist           General transfer comment: Walked CGA to sink (approx 10 feet).     Balance Overall balance assessment: Needs  assistance Sitting-balance support: No upper extremity supported, Single extremity supported, Feet supported Sitting balance-Leahy Scale: Good     Standing balance support: Bilateral upper extremity supported, During functional activity Standing balance-Leahy Scale: Fair                             ADL either performed or assessed with clinical judgement   ADL Overall ADL's : Needs assistance/impaired     Grooming: Oral care;Standing Grooming Details (indicate cue type and reason): standing at sink; pt leaning on L arm for stability while brushing with R hand.   Poor standing posture with walking and with standing to groom. Stood approx 3 minutes.               Lower Body Dressing Details (indicate cue type and reason): pt wearing non-skid socks, but able to lean forward to shins to pull them up tighter.             Functional mobility during ADLs: Contact guard assist;Rolling walker (2 wheels);Cueing for sequencing;Cueing for safety General ADL Comments: Pt requiring significant cues for RW use; holding it out in front, letting go and holding onto counter instead even while walking, holding onto rung under the handle bar; verbal and tactile cues to correct. Kyphotic posture during standing/walking.    Extremity/Trunk Assessment Upper Extremity Assessment Upper Extremity Assessment: Overall WFL for tasks assessed   Lower Extremity Assessment Lower Extremity Assessment: Defer to PT evaluation        Vision       Perception     Praxis      Cognition Arousal: Alert Behavior During Therapy: [facility]  for tasks assessed/performed, Impulsive Overall Cognitive Status: No family/caregiver present to determine baseline cognitive functioning                                 General Comments: Pt willing to work with OT.        Exercises      Shoulder Instructions       General Comments On room air.    Pertinent Vitals/ Pain       Pain  Assessment Pain Assessment: 0-10 Pain Score: 6  Pain Location: neck and upper back; pt reports continued migraine. Pain Descriptors / Indicators: Aching, Dull, Constant Pain Intervention(s): Limited activity within patient's tolerance, Repositioned  Home Living                                          Prior Functioning/Environment              Frequency  Min 1X/week        Progress Toward Goals  OT Goals(current goals can now be found in the care plan section)  Progress towards OT goals: Progressing toward goals  Acute Rehab OT Goals Patient Stated Goal: Get better OT Goal Formulation: With patient Time For Goal Achievement: 08/13/23 Potential to Achieve Goals: Good ADL Goals Pt Will Perform Eating: with modified independence;with adaptive utensils;sitting Pt Will Perform Grooming: with supervision;standing Pt Will Perform Upper Body Dressing: sitting;with supervision;with set-up Pt Will Perform Lower Body Dressing: sit to/from stand;with mod assist Pt Will Transfer to Toilet: with contact guard assist;ambulating Pt Will Perform Toileting - Clothing Manipulation and hygiene: with modified independence  Plan      Co-evaluation                 AM-PAC OT "6 Clicks" Daily Activity     Outcome Measure   Help from another person eating meals?: None Help from another person taking care of personal grooming?: A Little Help from another person toileting, which includes using toliet, bedpan, or urinal?: A Lot Help from another person bathing (including washing, rinsing, drying)?: A Lot Help from another person to put on and taking off regular upper body clothing?: A Little Help from another person to put on and taking off regular lower body clothing?: A Lot 6 Click Score: 16    End of Session Equipment Utilized During Treatment: Rolling walker (2 wheels);Gait belt  OT Visit Diagnosis: Other abnormalities of gait and mobility (R26.89)    Activity Tolerance Patient tolerated treatment well   Patient Left in bed;with call bell/phone within reach;with bed alarm set   Nurse Communication Mobility status        Time: 1308-6578 OT Time Calculation (min): 19 min  Charges: OT General Charges $OT Visit: 1 Visit OT Treatments $Self Care/Home Management : 8-22 mins  Linward Foster, MS, OTR/L  Alvester Morin 08/01/2023, 11:54 AM

## 2023-08-01 NOTE — TOC Progression Note (Signed)
Transition of Care [facility]) - Progression Note    Patient Details  Name: [name] MRN: [id number] Date of Birth: 1951-04-04  [facility] CM/SW Contact  Garret Reddish, RN Phone Number: 08/01/2023, 1:31 PM  Clinical Narrative:    Chart reviewed. Noted that patient is a resident at [facility].  I have spoken with [facility].  Doristine Mango reports that patient is able to return. I have informed Doristine Mango that patient will need PT/OT on discharge. Doristine Mango reports that he uses [facility] for home health for his residents.  [facility] is able to accept home health referral at this time. I have asked Madelaine Etienne with [facility] to accept home health referral.  TOC will continue to follow for discharge planning.            Expected Discharge Plan and Services                                               Social Determinants of Health (SDOH) Interventions SDOH Screenings   Food Insecurity: Food Insecurity Present (07/30/2023)  Housing: Low Risk  (07/30/2023)  Recent Concern: Housing - High Risk (06/18/2023)  Transportation Needs: No Transportation Needs (07/30/2023)  Recent Concern: Transportation Needs - Unmet Transportation Needs (07/01/2023)  Utilities: Not At Risk (07/30/2023)  Recent Concern: Utilities - At Risk (07/01/2023)  Alcohol Screen: Low Risk  (09/24/2022)  Depression (PHQ2-9): High Risk (04/26/2022)  Financial Resource Strain: High Risk (04/27/2022)  Physical Activity: Sufficiently Active (04/16/2018)  Social Connections: Moderately Isolated (04/30/2018)  Stress: Stress Concern Present (04/16/2018)  Tobacco Use: High Risk (07/30/2023)    Readmission Risk Interventions    06/18/2023   12:36 PM 11/07/2022    1:45 PM  Readmission Risk Prevention Plan  Transportation Screening Complete Complete  Medication Review [employer]) Complete   PCP or Specialist appointment within 3-5 days of discharge Complete   HRI or Home  Care Consult Complete   SW Recovery Care/Counseling Consult Not Complete Complete  Palliative Care Screening Not Complete Not Applicable  Skilled Nursing Facility Complete Not Applicable

## 2023-08-01 NOTE — Plan of Care (Signed)
  Problem: Education: Goal: Ability to describe self-care measures that may prevent or decrease complications (Diabetes Survival Skills Education) will improve Outcome: Progressing   Problem: Skin Integrity: Goal: Risk for impaired skin integrity will decrease Outcome: Progressing   Problem: Safety: Goal: Ability to remain free from injury will improve Outcome: Progressing   Problem: Skin Integrity: Goal: Risk for impaired skin integrity will decrease Outcome: Progressing   

## 2023-08-01 NOTE — Progress Notes (Signed)
I have reviewed and concur with this student's documentation.   Allyn Kenner, RN 08/01/2023 11:11 AM

## 2023-08-01 NOTE — Consult Note (Signed)
Consultation Note Date: 08/01/2023 at 1000  Patient Name: [name]  DOB: [birth year]  MRN: [id number]  Age / Sex: 72 y.o., male  PCP: [name], MD Referring Physician: Arnetha Courser, MD  HPI/Patient Profile: 72 y.o. 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  Patient is being treated for acute hepatic encephalopathy and AKI.  Patient has stopped taking his lactulose due to excessive diarrhea.  Lactulose restarted.  PT and OT consulted.  Initial recommendation was SNF for rehab but patient shows signs of improvement in H&H is now recommended.  PMT was consulted to discuss goals of care..   Clinical Assessment and Goals of Care: Extensive chart review completed prior to meeting patient including labs, vital signs, imaging, progress notes, orders, and available advanced directive documents from current and previous encounters. I then met with patient at bedside to discuss diagnosis prognosis, GOC, EOL wishes, disposition and options.  I introduced Palliative Medicine as specialized medical care for people living with serious illness. It focuses on providing relief from the symptoms and stress of a serious illness. The goal is to improve quality of life for both the patient and the family.  We discussed a brief life review of the patient.  Patient shares he was married but his wife is "no longer with [place]".  He shares that he has 1 living daughter who is "on a lot of drugs".  He shares he has not spoken to her in over a month.  He does not know her whereabouts.  He is not interested in speaking with her because he  does not want to get involved with "her kind of life".  Patient was minimally responsive to questions about his personal life.  Therapeutic silence and active listening provided.  As far as functional and nutritional status endorses he was feeling well up until about a week ago.  He cannot remember why he fell.  He knows that he is stopped taking his lactulose and believes it had some to do with his ammonia levels rising.  Education provided that lactulose helps remove ammonia since his liver is not filtering at normal levels.  Buildup of ammonia can cause confusion which can also lead to altered mental state.  Patient says he has been here before.  He says he knows that he needs to take his medicine.  Values and goals important to the patient attempted to be elicited.  Patient shares he wants to be able to go back to his group home.  He shares they take good care of him there, watch out for him, and he trusts them.  He ultimately wants to be able to walk again like he could about a week ago.  He shares that he is ready and willing to work  with PT so that he can get back to "regular life".  Advance directives, concepts specific to code status, artificial feeding and hydration, and rehospitalization were considered and discussed.  Patient endorses he has never thought about next steps as far as decision makers.    In the event that patient is unable to speak for himself, he is adamant that his daughter not be his next of kin decision maker.  Instead, he shares that he would like [name] from his group home to be in charge of his medical decision making.    I shared that as it stands right now, and according to [place] law, patient's next of kin would fall to his daughter unless patient creates a healthcare power of attorney.  Patient shares he is interested in creating an HCPOA.  I shared the patient needs to be fully awake, alert, and have full capacity in order to complete HCPOA paperwork. Patient says he is  not quite feeling like himself today but would like to complete this at a later time. He says he has a "brain fog".  Spiritual care to be consulted once patient regains full mental faculties and has capacity.  Code status discussed in detail.  Patient shares that he would never want to be kept alive on machines.  He does not want "locked tubes coming out of me".  This is drastically different than discussions patient has had in the past.  I highlighted that this is the first time patient has discussed DNR status.  He says he is thought about it but never wants to be kept alive on life support.   Given patient does not have full capacity at this time I am hesitant to change his CODE STATUS.  However, patient made his wishes known that he would never want his life sustained artificially.  Once patient has full capacity, it would then be appropriate to readdress CODE STATUS.  Full code and full scope remain for now.  Questions and concerns were addressed.  PMT will continue to follow and support patient throughout his hospitalization.  Primary Decision Maker PATIENT  Physical Exam Vitals reviewed.  Constitutional:      General: He is not in acute distress.    Appearance: He is normal weight.  HENT:     Head: Normocephalic.     Mouth/Throat:     Mouth: Mucous membranes are moist.  Eyes:     Pupils: Pupils are equal, round, and reactive to light.  Pulmonary:     Effort: Pulmonary effort is normal.  Abdominal:     Palpations: Abdomen is soft.  Skin:    General: Skin is warm and dry.  Neurological:     Mental Status: He is alert.     Comments: Oriented to self and situation  Psychiatric:        Mood and Affect: Mood normal.        Judgment: Judgment normal.     Palliative Assessment/Data: 60-70%     Thank you for this consult. Palliative medicine will continue to follow and assist holistically.   Time Total: 75 minutes  Time spent includes: Detailed review of medical records  (labs, imaging, vital signs), medically appropriate exam (mental status, respiratory, cardiac, skin), discussed with treatment team, counseling and educating patient, family and staff, documenting clinical information, medication management and coordination of care.  Signed by: Georgiann Cocker, DNP, FNP-BC Palliative Medicine   Please contact Palliative Medicine Team providers via [facility] for questions and concerns.

## 2023-08-01 NOTE — Progress Notes (Signed)
Progress Note   Patient: [name] [id number] DOB: 08-Mar-1951 DOA: 07/29/2023     0 DOS: the patient was seen and examined on 08/01/2023   Brief hospital course: Taken from H&P.  [name] is a 72 y.o. 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   Apparently patient was not taking his lactulose over the past 1 month because of getting diarrhea with that.  Progressive generalized weakness.  On arrival to the ED, patient was hypertensive at 180/76 with heart rate of 51. He was saturating at 99% on room air. He is afebrile. Initial workup notable for WBC of 2.5, hemoglobin of 13.2, platelets of 75, creatinine 0.36 with GFR 55 and ammonia of 98. Lactic acid 1.8. Urinalysis with glucosuria and rare bacteria but no leukocytes, nitrites or WBCs/hpf. CT of the head was obtained with no acute intracranial abnormality. Chest x-ray was obtained with no active disease. Patient started on lactulose.  12/3: Hemodynamically stable, mentation improved after having BM. Continue to feel very weak stating that he cannot walk.  He does not want to go back to his group home stating that they are not taking care of him.  Patient with multiple life limiting comorbidities, palliative care was also consulted.  12/4: Vital stable with borderline bradycardia, labs with mild worsening of pancytopenia, slight increase in creatinine to 1.33.  Encourage p.o. hydration.  PT is recommending SNF.  TOC was consulted for placement.  12/5: Vital stable, increased somnolence, worsening of creatinine at 1.41 and ammonia at 182, increasing the dose of lactulose.  PT recommendations changed to home health.  Stable pancytopenia.  Also giving some IV fluid.    Assessment and Plan: *  Acute hepatic encephalopathy (HCC) Patient is presenting with altered mental status and asterixis in the setting of hepatic encephalopathy due to discontinuation of lactulose at facility. Increased somnolence with worsening of ammonia today -Increasing the dose of lactulose as he was having just 1 bowel movement, need to have at least 3 - Titrate to 2-3 bowel movements per day - Restart home rifaximin - PT/OT given frequent falls - Dysphagia diet until improvement in mentation - Aspiration and fall precautions  AKI (acute kidney injury) (HCC) In the setting of poor p.o. intake reported by facility, likely due to hepatic encephalopathy. Creatinine at 1.41 today. -She had a somewhat IV fluid due to decreased p.o. intake -Monitor renal function -Avoid nephrotoxins  Sinus bradycardia Long-term history of a stable bradycardia, sinus rhythm on examination today.  Although occasionally labeled as junctional rhythm, P waves are present and consistent.  Given patient is otherwise asymptomatic, no need for continued telemetry monitoring  Liver cirrhosis (HCC) History of decompensated cirrhosis in the setting of active hepatitis C.  Complicated by acute on chronic encephalopathy.  - Hold home Lasix and spironolactone in the setting of AKI - Resume upon resolution of AKI  Pancytopenia (HCC) Stable at this time with no evidence of bleeding on examination.  - Repeat CBC in the a.m.  COPD (chronic obstructive pulmonary disease) (HCC) - DuoNebs as needed  HTN (hypertension) Blood pressure mildly elevated. -Restarting home losartan as kidney functions are improving -Holding home losartan and spironolactone which can be resumed once renal function at baseline  Chronic diastolic CHF (congestive heart failure) (HCC) Patient appears hypovolemic on  examination.  - Hold home diuretics in the setting of AKI - Daily weights  Diabetes mellitus without complication (HCC) - SSI, sensitive - Hold  home Jardiance   Subjective: Patient was more somnolent and continued to feel very weak, arousable but going back to sleep.  Physical Exam: Vitals:   07/31/23 2122 08/01/23 0030 08/01/23 0056 08/01/23 0751  BP: (!) 115/51 (!) 91/46 (!) 96/41 118/73  Pulse: (!) 54 (!) 51 (!) 51 (!) 47  Resp: 20 20  18   Temp: 97.7 F (36.5 C) 97.7 F (36.5 C)  97.7 F (36.5 C)  TempSrc: Oral     SpO2: 100% 97%  100%  Weight:      Height:       General.  Frail elderly man, in no acute distress. Pulmonary.  Lungs clear bilaterally, normal respiratory effort. CV.  Regular rate and rhythm, no JVD, rub or murmur. Abdomen.  Soft, nontender, nondistended, BS positive. CNS.  Somnolent.  No focal neurologic deficit. Extremities.  No edema, no cyanosis, pulses intact and symmetrical.    Data Reviewed: Prior data reviewed  Family Communication: No family at bedside, patient does not want me to call anyone.  Disposition: Status is: Inpatient  Planned Discharge Destination:  To be determined  Time spent: 45 minutes  This record has been created using Conservation officer, historic buildings. Errors have been sought and corrected,but may not always be located. Such creation errors do not reflect on the standard of care.   Author: Arnetha Courser, MD 08/01/2023 10:43 AM  For on call review [email or web address].

## 2023-08-01 NOTE — Assessment & Plan Note (Addendum)
[place] systemic hepatic encephalopathy.  Today with very mild asterixis.   Plan to continue medical therapy with lactulose, target 2 to 3 bowel movements per day.  Rifaxmin  Continue to advance diet to regular.  Out of bed to chair and continue to encourage mobility.  Pending transfer to group home on Monday.   Liver cirrhosis due to hepatitis C  Plan to resume diuretic therapy to avoid hypervolemia and ascites.

## 2023-08-02 DIAGNOSIS — J439 Emphysema, unspecified: Secondary | ICD-10-CM | POA: Diagnosis not present

## 2023-08-02 DIAGNOSIS — N179 Acute kidney failure, unspecified: Secondary | ICD-10-CM | POA: Diagnosis not present

## 2023-08-02 DIAGNOSIS — K7682 Hepatic encephalopathy: Secondary | ICD-10-CM | POA: Diagnosis not present

## 2023-08-02 DIAGNOSIS — E7220 Disorder of urea cycle metabolism, unspecified: Secondary | ICD-10-CM | POA: Diagnosis not present

## 2023-08-02 DIAGNOSIS — K7460 Unspecified cirrhosis of liver: Secondary | ICD-10-CM | POA: Diagnosis not present

## 2023-08-02 LAB — GLUCOSE, CAPILLARY
Glucose-Capillary: 73 mg/dL (ref 70–99)
Glucose-Capillary: 94 mg/dL (ref 70–99)

## 2023-08-02 MED ORDER — ALBUTEROL SULFATE (2.5 MG/3ML) 0.083% IN NEBU: 2.5000 mg | INHALATION_SOLUTION | RESPIRATORY_TRACT | Status: DC | PRN

## 2023-08-02 NOTE — Assessment & Plan Note (Signed)
Continue blood pressure control with losartan.

## 2023-08-02 NOTE — Plan of Care (Signed)
  Problem: Education: Goal: Ability to describe self-care measures that may prevent or decrease complications (Diabetes Survival Skills Education) will improve Outcome: Progressing   Problem: Skin Integrity: Goal: Risk for impaired skin integrity will decrease Outcome: Progressing   Problem: Pain Management: Goal: General experience of comfort will improve Outcome: Progressing   Problem: Safety: Goal: Ability to remain free from injury will improve Outcome: Progressing   Problem: Skin Integrity: Goal: Risk for impaired skin integrity will decrease Outcome: Progressing

## 2023-08-02 NOTE — Progress Notes (Signed)
Progress Note   Patient: [name] ZOX:[id number] DOB: 1951/06/02 DOA: 07/29/2023     1 DOS: the patient was seen and examined on 08/02/2023   Brief hospital course: Taken from H&P.  [name] is a 72 y.o. 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   Apparently patient was not taking his lactulose over the past 1 month because of getting diarrhea with that.  Progressive generalized weakness.  On arrival to the ED, patient was hypertensive at 180/76 with heart rate of 51. He was saturating at 99% on room air. He is afebrile. Initial workup notable for WBC of 2.5, hemoglobin of 13.2, platelets of 75, creatinine 0.36 with GFR 55 and ammonia of 98. Lactic acid 1.8. Urinalysis with glucosuria and rare bacteria but no leukocytes, nitrites or WBCs/hpf. CT of the head was obtained with no acute intracranial abnormality. Chest x-ray was obtained with no active disease. Patient started on lactulose.  12/3: Hemodynamically stable, mentation improved after having BM. Continue to feel very weak stating that he cannot walk.  He does not want to go back to his group home stating that they are not taking care of him.  Patient with multiple life limiting comorbidities, palliative care was also consulted.  12/4: Vital stable with borderline bradycardia, labs with mild worsening of pancytopenia, slight increase in creatinine to 1.33.  Encourage p.o. hydration.  PT is recommending SNF.  TOC was consulted for placement.  12/5: Vital stable, increased somnolence, worsening of creatinine at 1.41 and ammonia at 182, increasing the dose of lactulose.  PT recommendations changed to home health.  Stable pancytopenia.  Also giving some IV fluid.  12/6: Little more alert and  awake today.  Wants to go back to his group home.  They will take him back on Monday.  Having 2-3 bowel movements    Assessment and Plan: * Acute hepatic encephalopathy (HCC) Patient is presenting with altered mental status and asterixis in the setting of hepatic encephalopathy due to discontinuation of lactulose at facility. Increased somnolence with worsening of ammonia on 12/5 so lactulose dose was increased - Continue with lactulose - Titrate to 2-3 bowel movements per day - Restart home rifaximin - PT/OT given frequent falls-not recommending home health - Dysphagia diet until improvement in mentation - Aspiration and fall precautions  AKI (acute kidney injury) (HCC) In the setting of poor p.o. intake reported by facility, likely due to hepatic encephalopathy. Creatinine at 1.41 on 12/5 - Likely due to decreased p.o. intake -Received some gentle IV fluid -Monitor renal function -Avoid nephrotoxins  Sinus bradycardia Long-term history of a stable bradycardia, sinus rhythm on examination today.  Although occasionally labeled as junctional rhythm, P waves are present and consistent.  Given patient is otherwise asymptomatic, no need for continued telemetry monitoring  Liver cirrhosis (HCC) History of decompensated cirrhosis in the setting of active hepatitis C.  Complicated by acute on chronic encephalopathy.  - Hold home Lasix and spironolactone in the setting of AKI - Resume upon resolution of AKI  Pancytopenia (HCC) Stable at this time with no evidence of bleeding on examination.  - Repeat CBC in the a.m.  COPD (chronic obstructive pulmonary disease) (HCC) - DuoNebs as needed  HTN (hypertension) Blood pressure within goal -Restarting home losartan as kidney functions are improving -  Holding home Lasix and spironolactone which can be resumed once renal function at baseline  Chronic diastolic CHF (congestive heart failure) (HCC) Patient appears hypovolemic on  examination.  - Hold home diuretics in the setting of AKI - Daily weights  Diabetes mellitus without complication (HCC) - SSI, sensitive - Hold home Jardiance   Subjective: Patient was more awake and alert when seen today.  Had 2 bowel movements since this morning per patient.  No nausea or vomiting.  Physical Exam: Vitals:   08/01/23 1647 08/01/23 2222 08/01/23 2226 08/02/23 0929  BP: (!) 148/75 (!) 126/58 101/72 (!) 107/55  Pulse: (!) 47 (!) 47 69 (!) 50  Resp: 16 19 18 18   Temp: 97.8 F (36.6 C) 98.2 F (36.8 C) 98.5 F (36.9 C) 98.1 F (36.7 C)  TempSrc:  Oral Oral Oral  SpO2: 100% 100% 96% 100%  Weight:      Height:       General.  Frail elderly man, in no acute distress. Pulmonary.  Lungs clear bilaterally, normal respiratory effort. CV.  Regular rate and rhythm, no JVD, rub or murmur. Abdomen.  Soft, nontender, nondistended, BS positive. CNS.  Alert and oriented .  No focal neurologic deficit. Extremities.  No edema, no cyanosis, pulses intact and symmetrical. Psychiatry.  Judgment and insight appears normal.    Data Reviewed: Prior data reviewed  Family Communication: No family at bedside, patient does not want me to call anyone.  Disposition: Status is: Inpatient  Planned Discharge Destination: Back to group home on Monday Time spent: 44 minutes  This record has been created using Conservation officer, historic buildings. Errors have been sought and corrected,but may not always be located. Such creation errors do not reflect on the standard of care.   Author: Arnetha Courser, MD 08/02/2023 3:12 PM  For on call review [email or web address].

## 2023-08-02 NOTE — Progress Notes (Signed)
Physical Therapy Treatment Patient Details Name: [name] MRN: [id number] DOB: 1950-09-28 Today's Date: 08/02/2023   History of Present Illness 72 y.o. 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    PT Comments  Pt received in bed and agreed to PT session. Pt reported pain to be on a scale of 9/10 in his back. Pt performed STS with the use of RW (2wheels) SUP and amb ~82ft CGA prior to ending session in bed with NP in room. VC necessary throughout session for RW management. Pt tolerated Tx well and will continue to benefit from skilled PT sessions to improve strength, activity tolerance, and functional mobility to maximize safety/return to PLOF following D/C. Secure chat with TOC regarding change in D/C disposition.     If plan is discharge home, recommend the following: A little help with walking and/or transfers;Assist for transportation;Help with stairs or ramp for entrance   Can travel by private vehicle     No  Equipment Recommendations  BSC/3in1    Recommendations for Other Services       Precautions / Restrictions Precautions Precautions: Fall Restrictions Weight Bearing Restrictions: No     Mobility  Bed Mobility Overal bed mobility: Needs Assistance Bed Mobility: Supine to Sit, Sit to Supine     Supine to sit: Supervision Sit to supine: Supervision   General bed mobility comments: Pt performed bed mobility SUP without reports of s/sx relative to dizziness    Transfers Overall transfer level: Needs assistance Equipment used: Rolling walker (2 wheels) Transfers: Sit to/from Stand Sit to Stand: Supervision           General transfer comment: Pt performed STS with the use of RW and did  not report any s/sx relative to dizziness when standing    Ambulation/Gait Ambulation/Gait assistance: Contact guard assist Gait Distance (Feet): 70 Feet Assistive device: Rolling walker (2 wheels) Gait Pattern/deviations: Step-to pattern, Antalgic Gait velocity: decreased     General Gait Details: Pt amb with the use of RW (2wheels) CGA in hallway. VC necessary for RW management. Due to fatigue, pt requested to return back to room.   Stairs             Wheelchair Mobility     Tilt Bed    Modified Rankin (Stroke Patients Only)       Balance Overall balance assessment: Needs assistance Sitting-balance support: No upper extremity supported, Single extremity supported, Feet supported Sitting balance-Leahy Scale: Good     Standing balance support: Bilateral upper extremity supported, During functional activity Standing balance-Leahy Scale: Fair                              Cognition Arousal: Alert Behavior During Therapy: WFL for tasks assessed/performed, Impulsive Overall Cognitive Status: No family/caregiver present to determine baseline cognitive functioning                                 General Comments: Pt willing to work with PT.        Exercises      General Comments        Pertinent Vitals/Pain Pain Assessment Pain Assessment: 0-10  Pain Score: 9  Pain Location: Back Pain Descriptors / Indicators: Aching, Dull, Constant Pain Intervention(s): Monitored during session    Home Living                          Prior Function            PT Goals (current goals can now be found in the care plan section) Acute Rehab PT Goals Patient Stated Goal: To go home PT Goal Formulation: With patient Time For Goal Achievement: 08/13/23 Potential to Achieve Goals: Good Progress towards PT goals: Progressing toward goals    Frequency    Min 1X/week      PT Plan      Co-evaluation               AM-PAC PT "6 Clicks" Mobility   Outcome Measure  Help needed turning from your back to your side while in a flat bed without using bedrails?: None Help needed moving from lying on your back to sitting on the side of a flat bed without using bedrails?: A Little Help needed moving to and from a bed to a chair (including a wheelchair)?: A Little Help needed standing up from a chair using your arms (e.g., wheelchair or bedside chair)?: A Little Help needed to walk in hospital room?: A Little Help needed climbing 3-5 steps with a railing? : A Lot 6 Click Score: 18    End of Session   Activity Tolerance: Patient tolerated treatment well;Patient limited by fatigue Patient left: in bed;with call bell/phone within reach;with nursing/sitter in room Nurse Communication: Mobility status PT Visit Diagnosis: Unsteadiness on feet (R26.81);Other abnormalities of gait and mobility (R26.89);Muscle weakness (generalized) (M62.81);Difficulty in walking, not elsewhere classified (R26.2);Pain     Time: 2956-2130 PT Time Calculation (min) (ACUTE ONLY): 11 min  Charges:    $Gait Training: 8-22 mins PT General Charges $$ ACUTE PT VISIT: 1 Visit                     Delayza Lungren Sauvignon Howard SPT, LAT, ATC  Rivan Siordia Sauvignon-Howard 08/02/2023, 1:59 PM

## 2023-08-02 NOTE — TOC Progression Note (Signed)
Transition of Care [facility]) - Progression Note    Patient Details  Name: [name] MRN: [id number] Date of Birth: July 06, 1951  Transition [facility]) CM/SW Contact  Garret Reddish, RN Phone Number: 08/02/2023, 2:51 PM  Clinical Narrative:     I have spoken with Doristine Mango, Owner of group home.  He reports that he is not able to accept patient over the weekend.  He will be able to accept patient on Monday.    TOC will continue to follow for discharge planning.         Expected Discharge Plan and Services                                               Social Determinants of Health (SDOH) Interventions SDOH Screenings   Food Insecurity: Food Insecurity Present (07/30/2023)  Housing: Low Risk  (07/30/2023)  Recent Concern: Housing - High Risk (06/18/2023)  Transportation Needs: No Transportation Needs (07/30/2023)  Recent Concern: Transportation Needs - Unmet Transportation Needs (07/01/2023)  Utilities: Not At Risk (07/30/2023)  Recent Concern: Utilities - At Risk (07/01/2023)  Alcohol Screen: Low Risk  (09/24/2022)  Depression (PHQ2-9): High Risk (04/26/2022)  Financial Resource Strain: High Risk (04/27/2022)  Physical Activity: Sufficiently Active (04/16/2018)  Social Connections: Moderately Isolated (04/30/2018)  Stress: Stress Concern Present (04/16/2018)  Tobacco Use: High Risk (07/30/2023)    Readmission Risk Interventions    06/18/2023   12:36 PM 11/07/2022    1:45 PM  Readmission Risk Prevention Plan  Transportation Screening Complete Complete  Medication Review Oceanographer) Complete   PCP or Specialist appointment within 3-5 days of discharge Complete   HRI or Home Care Consult Complete   SW Recovery Care/Counseling Consult Not Complete Complete  Palliative Care Screening Not Complete Not Applicable  Skilled Nursing Facility Complete Not Applicable

## 2023-08-02 NOTE — Progress Notes (Signed)
Palliative Care Progress Note, Assessment & Plan   Patient Name: [name]       Date: 08/02/2023 DOB: 09/13/1950  Age: 72 y.o. MRN#: [id number] Attending Physician: Arnetha Courser, MD Primary Care Physician: [name], MD Admit Date: 07/29/2023  Subjective: Patient is out of bed and walking in the hallway with PT.  He is using a walker.  She is doing standby assist with minimal support.  He is awake, alert, and oriented x 4.  He acknowledges my presence and is able to make his wishes known.  I help him get back to bed.  No family or friends present during my visit.  HPI: 72 y.o. 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   Patient is being treated for acute hepatic encephalopathy and AKI.   Patient has stopped taking his lactulose due to excessive diarrhea.  Lactulose restarted.  PT and OT consulted.  Initial recommendation was SNF for rehab but patient shows signs of improvement in H&H is now recommended.   PMT was consulted to discuss goals of care.  Summary of counseling/coordination of care: Extensive chart review completed prior to meeting patient including labs, vital signs, imaging, progress notes, orders, and available advanced directive documents from current and previous encounters.   After reviewing the patient's chart and assessing the patient at bedside, I discussed symptom management and plan of care with patient.  Symptoms assessed.  Patient shares he feels more like himself today.  He  knows he was having a little confusion and that he now feels "more clear".  He denies the fog brain, pain, discomfort, headache, or other acute issues at this time.  No adjustment to [facility] needed.  I attempted to elicit values and goals important to the patient.  I reference her discussion yesterday as far as boundaries and goals of care.  He shares that he thought some about it and is certainly clear that he wants to be a DNR.  He is also clear that if he still has a pulse in his breathing then he wants anything and everything done to sustain his life.  Patient would be accepting of ventilatory support, IV fluids, and a feeding tube and definitely if it was going to sustain his life.  Quality of life versus quantity of life reviewed.  MOST form completed.  Copy given to patient and original placed in shadow chart.  DNR completed.  [place] form placed in shadow chart.  Both documents sent to medical records for download into ACP tab of epic.  Symptom burden is low.  Goals are clear.  Plan is set for patient to return to group home but they cannot accept him until Monday.  No further acute acute palliative needs at this time.  PMT will shadow the chart and monitor him peripherally.  Please reach out via Amion for on-call providers for any acute palliative needs.  Physical Exam Vitals reviewed.  Constitutional:  General: He is not in acute distress.    Appearance: He is normal weight.  HENT:     Head: Normocephalic.     Mouth/Throat:     Mouth: Mucous membranes are moist.  Eyes:     Pupils: Pupils are equal, round, and reactive to light.  Pulmonary:     Effort: Pulmonary effort is normal.  Abdominal:     Palpations: Abdomen is soft.  Musculoskeletal:        General: Normal range of motion.  Skin:    General: Skin is warm and dry.  Neurological:     Mental Status: He is alert and oriented to person, place, and time.  Psychiatric:        Mood and Affect: Mood normal.         Behavior: Behavior normal.        Thought Content: Thought content normal.        Judgment: Judgment normal.             Total Time 35 minutes   Time spent includes: Detailed review of medical records (labs, imaging, vital signs), medically appropriate exam (mental status, respiratory, cardiac, skin), discussed with treatment team, counseling and educating patient, family and staff, documenting clinical information, medication management and coordination of care.  Samara Deist L. Bonita Quin, DNP, FNP-BC Palliative Medicine Team

## 2023-08-03 DIAGNOSIS — R001 Bradycardia, unspecified: Secondary | ICD-10-CM | POA: Diagnosis not present

## 2023-08-03 DIAGNOSIS — N179 Acute kidney failure, unspecified: Secondary | ICD-10-CM | POA: Diagnosis not present

## 2023-08-03 DIAGNOSIS — K7682 Hepatic encephalopathy: Secondary | ICD-10-CM | POA: Diagnosis not present

## 2023-08-03 DIAGNOSIS — D61818 Other pancytopenia: Secondary | ICD-10-CM | POA: Diagnosis not present

## 2023-08-03 LAB — CULTURE, BLOOD (ROUTINE X 2)
Culture: NO GROWTH
Culture: NO GROWTH
Special Requests: ADEQUATE

## 2023-08-03 LAB — GLUCOSE, CAPILLARY
Glucose-Capillary: 90 mg/dL (ref 70–99)
Glucose-Capillary: 105 mg/dL — ABNORMAL HIGH (ref 70–99)
Glucose-Capillary: 87 mg/dL (ref 70–99)
Glucose-Capillary: 78 mg/dL (ref 70–99)
Glucose-Capillary: 121 mg/dL — ABNORMAL HIGH (ref 70–99)

## 2023-08-03 LAB — CBC
HCT: 29 % — ABNORMAL LOW (ref 39.0–52.0)
Hemoglobin: 9.8 g/dL — ABNORMAL LOW (ref 13.0–17.0)
MCH: 30.7 pg (ref 26.0–34.0)
MCHC: 33.8 g/dL (ref 30.0–36.0)
MCV: 90.9 fL (ref 80.0–100.0)
Platelets: 52 10*3/uL — ABNORMAL LOW (ref 150–400)
RBC: 3.19 MIL/uL — ABNORMAL LOW (ref 4.22–5.81)
RDW: 15.7 % — ABNORMAL HIGH (ref 11.5–15.5)
WBC: 2.7 10*3/uL — ABNORMAL LOW (ref 4.0–10.5)
nRBC: 0 % (ref 0.0–0.2)

## 2023-08-03 LAB — COMPREHENSIVE METABOLIC PANEL
ALT: 32 U/L (ref 0–44)
AST: 63 U/L — ABNORMAL HIGH (ref 15–41)
Albumin: 2 g/dL — ABNORMAL LOW (ref 3.5–5.0)
Alkaline Phosphatase: 101 U/L (ref 38–126)
Anion gap: 4 — ABNORMAL LOW (ref 5–15)
BUN: 20 mg/dL (ref 8–23)
CO2: 25 mmol/L (ref 22–32)
Calcium: 9.4 mg/dL (ref 8.9–10.3)
Chloride: 106 mmol/L (ref 98–111)
Creatinine, Ser: 1.14 mg/dL (ref 0.61–1.24)
GFR, Estimated: >60 mL/min (ref >60)
Glucose, Bld: 98 mg/dL (ref 70–99)
Potassium: 4 mmol/L (ref 3.5–5.1)
Sodium: 135 mmol/L (ref 135–145)
Total Bilirubin: 1.2 mg/dL — ABNORMAL HIGH (ref <1.2)
Total Protein: 5.1 g/dL — ABNORMAL LOW (ref 6.5–8.1)

## 2023-08-03 LAB — AMMONIA: Ammonia: 78 umol/L — ABNORMAL HIGH (ref 9–35)

## 2023-08-03 MED ORDER — FUROSEMIDE 20 MG PO TABS
20.0000 mg | ORAL_TABLET | Freq: Every day | ORAL | Status: DC
  Administered 2023-08-03 – 2023-08-05 (×3): 20 mg via ORAL
  Filled 2023-08-03 (×3): qty 1

## 2023-08-03 MED ORDER — EMPAGLIFLOZIN 10 MG PO TABS
10.0000 mg | ORAL_TABLET | Freq: Every day | ORAL | Status: DC
  Administered 2023-08-04 – 2023-08-05 (×2): 10 mg via ORAL
  Filled 2023-08-03 (×2): qty 1

## 2023-08-03 MED ORDER — SPIRONOLACTONE 12.5 MG HALF TABLET
12.5000 mg | ORAL_TABLET | Freq: Every day | ORAL | Status: DC
  Administered 2023-08-03 – 2023-08-05 (×3): 12.5 mg via ORAL
  Filled 2023-08-03 (×4): qty 1

## 2023-08-03 NOTE — Plan of Care (Signed)
  Problem: Education: Goal: Knowledge of General Education information will improve Description: Including pain rating scale, medication(s)/side effects and non-pharmacologic comfort measures Outcome: Progressing   Problem: Activity: Goal: Risk for activity intolerance will decrease Outcome: Progressing   Problem: Skin Integrity: Goal: Risk for impaired skin integrity will decrease Outcome: Progressing   

## 2023-08-03 NOTE — TOC Progression Note (Signed)
Transition of Care [facility]) - Progression Note    Patient Details  Name: [name] MRN: [id number] Date of Birth: 1951/06/05  [facility] CM/SW Contact  Liliana Cline, LCSW Phone Number: 08/03/2023, 2:38 PM  Clinical Narrative:    Patient told nurse he owes $800 to be able to go back to his facility. RN asked CSW to confirm this. Left a VM for [facility] requesting a return call.         Expected Discharge Plan and Services                                               Social Determinants of Health (SDOH) Interventions SDOH Screenings   Food Insecurity: Food Insecurity Present (07/30/2023)  Housing: Low Risk  (07/30/2023)  Recent Concern: Housing - High Risk (06/18/2023)  Transportation Needs: No Transportation Needs (07/30/2023)  Recent Concern: Transportation Needs - Unmet Transportation Needs (07/01/2023)  Utilities: Not At Risk (07/30/2023)  Recent Concern: Utilities - At Risk (07/01/2023)  Alcohol Screen: Low Risk  (09/24/2022)  Depression (PHQ2-9): High Risk (04/26/2022)  Financial Resource Strain: High Risk (04/27/2022)  Physical Activity: Sufficiently Active (04/16/2018)  Social Connections: Moderately Isolated (04/30/2018)  Stress: Stress Concern Present (04/16/2018)  Tobacco Use: High Risk (07/30/2023)    Readmission Risk Interventions    06/18/2023   12:36 PM 11/07/2022    1:45 PM  Readmission Risk Prevention Plan  Transportation Screening Complete Complete  Medication Review Oceanographer) Complete   PCP or Specialist appointment within 3-5 days of discharge Complete   HRI or Home Care Consult Complete   SW Recovery Care/Counseling Consult Not Complete Complete  Palliative Care Screening Not Complete Not Applicable  Skilled Nursing Facility Complete Not Applicable

## 2023-08-03 NOTE — Assessment & Plan Note (Signed)
Continue with quetiapine and citalopram.

## 2023-08-03 NOTE — Assessment & Plan Note (Signed)
No signs of exacerbation, continue with bronchodilator therapy.

## 2023-08-03 NOTE — Assessment & Plan Note (Addendum)
No signs of exacerbation, plan to continue losartan. Diuretic therapy with spironolactone.  Resume SGLT 2 inh.

## 2023-08-03 NOTE — Progress Notes (Signed)
Progress Note   Patient: [name]:[id number] DOB: [birth year] DOA: 07/29/2023     2 DOS: the patient was seen and examined on 08/03/2023   Brief hospital course: [name] was admitted to the hospital with the working diagnosis of acute metabolic encephalopathy.   72 y.o. 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   Apparently patient was not taking his lactulose over the past 1 month because of getting diarrhea with that.  Progressive generalized weakness.  On arrival to the ED, patient was hypertensive at 180/76 with heart rate of 51. He was saturating at 99% on room air. He was afebrile. Initial workup notable for WBC of 2.5, hemoglobin of 13.2, platelets of 75, creatinine 0.36 with GFR 55 and ammonia of 98. Lactic acid 1.8. Urinalysis with glucosuria and rare bacteria but no leukocytes, nitrites or WBCs/hpf.  CT of the head was obtained with no acute intracranial abnormality. Chest x-ray was obtained with no active disease.  Patient started on lactulose.  12/3: Hemodynamically stable, mentation improved after having BM. Continue to feel very weak stating that he cannot walk.  He does not want to go back to his group home stating that they are not taking care of him.  Patient with multiple life limiting comorbidities, palliative care was also consulted.  12/4: Vital stable with borderline bradycardia, labs with mild worsening of pancytopenia, slight increase in creatinine to 1.33.  Encourage p.o. hydration.  PT is recommending SNF.  TOC was consulted for placement.  12/5: Vital stable, increased somnolence, worsening of creatinine at 1.41 and ammonia at 182, increasing the dose of lactulose.  PT recommendations changed to home health.   Stable pancytopenia.  Also giving some IV fluid.  12/6: Little more alert and awake today.  Wants to go back to his group home.  They will take him back on Monday.  Having 2-3 bowel movements      Assessment and Plan: * Acute hepatic encephalopathy (HCC) Porto systemic hepatic encephalopathy.  Today with very mild asterixis.   Plan to continue medical therapy with lactulose, target 2 to 3 bowel movements per day.  Rifaxmin  Continue to advance diet to regular.  Out of bed to chair and continue to encourage mobility.  Pending transfer to group home on Monday.   Liver cirrhosis due to hepatitis C  Plan to resume diuretic therapy to avoid hypervolemia and ascites.    AKI (acute kidney injury) (HCC) Renal function with serum cr at 1,1 with K at 4,0 and serum bicarbonate at 25  Na 135   Plan to continue lactulose.   Sinus bradycardia Long-term history of a stable bradycardia.   EKG 53 bpm, normal axis, normal intervals, atrial rhythm with PVC, with no significant ST segment or T wave changes.  Continue close monitoring.    Liver cirrhosis (HCC) History of decompensated cirrhosis in the setting of active hepatitis C.  Complicated by acute on chronic encephalopathy.  - Hold home Lasix and spironolactone in the setting of AKI - Resume upon resolution of AKI  Pancytopenia (HCC) Cell count today with wbc 2,7, hgb 9,8 and plt 52 Likely due to liver disease.   COPD (chronic obstructive pulmonary disease) (HCC) No signs of exacerbation, continue with bronchodilator therapy.  HTN (hypertension) Continue blood pressure  control with losartan.   Chronic diastolic CHF (congestive heart failure) (HCC) No signs of exacerbation, plan to continue losartan. Will resume diuretic therapy with spironolactone.  Resume SGLT 2 inh.   Type 2 diabetes mellitus with hyperlipidemia (HCC) Glucose has been stable, patient now is off insulin therapy, continue CBG monitoring as needed. He is  tolerating po diet well.   Continue with statin therapy.   Depression Continue with quetiapine and citalopram.         Subjective: Patient with improvement in his symptoms, only mild tremors hands with no confusion or agitation, tolerating po well.   Physical Exam: Vitals:   08/02/23 0929 08/02/23 1626 08/02/23 2352 08/03/23 0803  BP: (!) 107/55 135/69 127/65 (!) 155/70  Pulse: (!) 50 (!) 54  (!) 51  Resp: 18 17 20 16   Temp: 98.1 F (36.7 C) 98.3 F (36.8 C) 98 F (36.7 C) 98.8 F (37.1 C)  TempSrc: Oral Oral    SpO2: 100% 100% 97% 100%  Weight:      Height:       Neurology awake and alert, mild tremors of his hands at rest, no confusion or agitation  ENT with mild pallor with no icterus Cardiovascular with S1 and S2 present and regular with no gallops Respiratory with no rales or wheezing, no rhonchi Abdomen with no distention  No lower extremity edema  Data Reviewed:    Family Communication: no family at the bedside   Disposition: Status is: Inpatient Remains inpatient appropriate because: Patient will return to group home on Monday   Planned Discharge Destination: Home     Author: Coralie Keens, MD 08/03/2023 12:24 PM  For on call review [email or web address].

## 2023-08-03 NOTE — Assessment & Plan Note (Addendum)
Glucose has been stable, patient now is off insulin therapy. Capillary glucose is 121, 89 and 147  He is tolerating po diet well.   Continue with statin therapy.

## 2023-08-04 DIAGNOSIS — K7682 Hepatic encephalopathy: Secondary | ICD-10-CM | POA: Diagnosis not present

## 2023-08-04 DIAGNOSIS — N179 Acute kidney failure, unspecified: Secondary | ICD-10-CM | POA: Diagnosis not present

## 2023-08-04 DIAGNOSIS — R001 Bradycardia, unspecified: Secondary | ICD-10-CM | POA: Diagnosis not present

## 2023-08-04 DIAGNOSIS — J439 Emphysema, unspecified: Secondary | ICD-10-CM | POA: Diagnosis not present

## 2023-08-04 LAB — GLUCOSE, CAPILLARY
Glucose-Capillary: 89 mg/dL (ref 70–99)
Glucose-Capillary: 147 mg/dL — ABNORMAL HIGH (ref 70–99)
Glucose-Capillary: 87 mg/dL (ref 70–99)
Glucose-Capillary: 110 mg/dL — ABNORMAL HIGH (ref 70–99)

## 2023-08-04 NOTE — Plan of Care (Signed)

## 2023-08-04 NOTE — Progress Notes (Signed)
Progress Note   Patient: [name] WUJ:[id number] DOB: 1950/10/13 DOA: 07/29/2023     3 DOS: the patient was seen and examined on 08/04/2023   Brief hospital course: Mr. HYDER BACAK was admitted to the hospital with the working diagnosis of acute metabolic encephalopathy.   72 y.o. 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   Apparently patient was not taking his lactulose over the past 1 month because of getting diarrhea with that.  Progressive generalized weakness.  On arrival to the ED, patient was hypertensive at 180/76 with heart rate of 51. He was saturating at 99% on room air. He was afebrile. Initial workup notable for WBC of 2.5, hemoglobin of 13.2, platelets of 75, creatinine 0.36 with GFR 55 and ammonia of 98. Lactic acid 1.8. Urinalysis with glucosuria and rare bacteria but no leukocytes, nitrites or WBCs/hpf.  CT of the head was obtained with no acute intracranial abnormality. Chest x-ray was obtained with no active disease.  Patient started on lactulose.  12/3: Hemodynamically stable, mentation improved after having BM. Continue to feel very weak stating that he cannot walk.  He does not want to go back to his group home stating that they are not taking care of him.  Patient with multiple life limiting comorbidities, palliative care was also consulted.  12/4: Vital stable with borderline bradycardia, labs with mild worsening of pancytopenia, slight increase in creatinine to 1.33.  Encourage p.o. hydration.  PT is recommending SNF.  TOC was consulted for placement.  12/5: Vital stable, increased somnolence, worsening of creatinine at 1.41 and ammonia at 182, increasing the dose of lactulose.  PT recommendations changed to home health.   Stable pancytopenia.  Also giving some IV fluid.  12/6: Little more alert and awake today.  Wants to go back to his group home.  They will take him back on Monday.  Having 2-3 bowel movements      Assessment and Plan: * Acute hepatic encephalopathy (HCC) Porto systemic hepatic encephalopathy, clinically resolved.   Plan to continue medical therapy with lactulose, target 2 to 3 bowel movements per day.  Rifaxmin  Tolerating regular diet.  Out of bed to chair and continue to encourage mobility.  Pending transfer to group home on Monday.   Liver cirrhosis due to hepatitis C  Plan to resume diuretic therapy to avoid hypervolemia and ascites.    AKI (acute kidney injury) (HCC) Renal function now improved and electrolytes have been corrected.  Plan to continue furosemide and spironolactone.  Continue lactulose.   Sinus bradycardia Long-term history of a stable bradycardia.   EKG 53 bpm, normal axis, normal intervals, atrial rhythm with PVC, with no significant ST segment or T wave changes.  Continue close monitoring.    Pancytopenia (HCC) Follow up cell count with wbc 2,7, hgb 9,8 and plt 52 Likely due to liver disease.   COPD (chronic obstructive pulmonary disease) (HCC) No signs of exacerbation, continue with bronchodilator therapy.  HTN (hypertension) Continue blood pressure control with losartan.   Chronic diastolic CHF (congestive heart failure) (HCC) No signs of exacerbation, plan to continue losartan. Diuretic therapy with spironolactone.  Resume SGLT 2 inh.   Type 2 diabetes mellitus with hyperlipidemia (HCC) Glucose has been stable, patient now is off insulin therapy. Capillary glucose is 121,  89 and 147  He is tolerating po diet well.   Continue with statin therapy.   Depression Continue with quetiapine and citalopram.         Subjective: patient with no chest pain or dyspnea, tolerating po well and has been ambulating   Physical Exam: Vitals:    08/03/23 0803 08/03/23 1530 08/04/23 0005 08/04/23 0700  BP: (!) 155/70 (!) 151/66 (!) 129/59 122/68  Pulse: (!) 51 (!) 50 (!) 54 (!) 50  Resp: 16 16 18 16   Temp: 98.8 F (37.1 C) 98.1 F (36.7 C) 98.2 F (36.8 C) 98.2 F (36.8 C)  TempSrc:  Oral  Oral  SpO2: 100% 100% 99% 100%  Weight:      Height:       Neurology awake and alert ENT with no pallor Cardiovascular with S1 and S2 present and regular with no gallops, or rubs Respiratory with no rales or wheezing Abdomen with no distention  No lower extremity edema  Data Reviewed:    Family Communication: no family at the bedside   Disposition: Status is: Inpatient Remains inpatient appropriate because: pending transfer to group home tomorrow   Planned Discharge Destination: Home     Author: Coralie Keens, MD 08/04/2023 12:57 PM  For on call review [email or web address].

## 2023-08-04 NOTE — Plan of Care (Signed)
  Problem: Health Behavior/Discharge Planning: Goal: Ability to manage health-related needs will improve Outcome: Progressing   Problem: Nutritional: Goal: Maintenance of adequate nutrition will improve Outcome: Progressing   Problem: Education: Goal: Knowledge of General Education information will improve Description: Including pain rating scale, medication(s)/side effects and non-pharmacologic comfort measures Outcome: Progressing   Problem: Tissue Perfusion: Goal: Adequacy of tissue perfusion will improve Outcome: Progressing   Problem: Coping: Goal: Level of anxiety will decrease Outcome: Progressing

## 2023-08-05 DIAGNOSIS — K7682 Hepatic encephalopathy: Secondary | ICD-10-CM | POA: Diagnosis not present

## 2023-08-05 LAB — GLUCOSE, CAPILLARY
Glucose-Capillary: 74 mg/dL (ref 70–99)
Glucose-Capillary: 111 mg/dL — ABNORMAL HIGH (ref 70–99)

## 2023-08-05 MED ORDER — LACTULOSE 10 GM/15ML PO SOLN: 30.0000 g | Freq: Three times a day (TID) | ORAL | 0 refills | Status: DC

## 2023-08-05 NOTE — Care Management Important Message (Addendum)
Important Message  Patient Details  Name: [name] MRN: [id number] Date of Birth: 18-Oct-1950   Important Message Given:  Yes - Medicare IM  I reviewed the form with patient and obtained his signature on his initial IMM. I wished him well and thanked him for his time.  I placed the original copy on his chart to be scanned into his medical record.   Olegario Messier A Pranit Owensby 08/05/2023, 11:46 AM

## 2023-08-05 NOTE — [place]2 (Signed)
[place] MEDICAID FL2 LEVEL OF CARE FORM     IDENTIFICATION  Patient Name: [name] Birthdate: 1950/11/09 Sex: male Admission Date (Current Location): 07/29/2023  [place] and [place] Number:  [address] [id number] [facility] Facility and Address:  [facility], [address], [place], [place] [address]      Provider Number: [id number]  Attending Physician Name and Address:  Lanae Boast, MD  Relative Name and Phone Number:  Kieth Brightly [phone]    Current Level of Care: Hospital Recommended Level of Care: Other (Comment) (Group Home) Prior Approval Number:    Date Approved/Denied:   PASRR Number:    Discharge Plan:  (Group Home)    Current Diagnoses: Patient Active Problem List   Diagnosis Date Noted   Hepatic encephalopathy (HCC) 08/01/2023   Hyperammonemia (HCC) 07/30/2023   Cervical spinal stenosis 07/01/2023   Cervical myelopathy (HCC) 06/21/2023   Cervical stenosis of spine 06/21/2023   Cervical radiculopathy 06/20/2023   E coli bacteremia 06/17/2023   Left leg cellulitis 03/23/2023   Leukocytosis 03/23/2023   Serum total bilirubin elevated 03/23/2023   Left leg pain 03/23/2023   Suicidal ideation 01/14/2023   Acute pain 01/14/2023   Adrenal insufficiency (HCC) 12/26/2022   Orthostatic hypotension 12/24/2022   Rib pain on right side 12/24/2022   Hepatitis B infection 12/24/2022   Myocardial ischemia 12/24/2022   Acute appendicitis 11/05/2022   Pancytopenia (HCC) 11/05/2022   Sinus bradycardia 11/05/2022   Acute metabolic encephalopathy 09/22/2022   Liver lesion 09/22/2022   Hyperlipidemia, unspecified 09/22/2022   Type 2 diabetes mellitus with hyperlipidemia (HCC) 09/22/2022   Obesity (BMI 30-39.9) 09/22/2022   Acute hepatic encephalopathy (HCC) 09/22/2022   MDD (major depressive disorder), recurrent severe, 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   Major depressive disorder, 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 psychotic features (HCC)  09/15/2022   Protein-calorie malnutrition, severe (HCC) 08/31/2022   Hypotension 08/31/2022   Weakness    Hx of transient ischemic attack (TIA) 04/28/2022   Obesity    Chronic diastolic CHF (congestive heart failure) (HCC)    Depression    Sepsis (HCC) 03/28/2022   Bacteremia due to Klebsiella pneumoniae 03/28/2022   Hypoglycemia 03/28/2022   Syncope 03/24/2022   Hypokalemia 03/24/2022   Fever 03/24/2022   COPD (chronic obstructive pulmonary disease) (HCC)    Elevated troponin    Abnormal LFTs    History of hepatitis C    AKI (acute kidney injury) (HCC)    Pure hypercholesterolemia    Obesity, Class III, BMI 40-49.9 (morbid obesity) (HCC) 04/18/2020   Thrombocytopenia (HCC) 04/17/2020   Leukopenia 04/17/2020   HTN (hypertension) 04/17/2018   Uncontrolled type 2 diabetes mellitus with hyperglycemia, without long-term current use of insulin (HCC) 04/17/2018   Lymphedema 04/17/2018   Normocytic anemia 04/07/2018   Chest pain 03/20/2018   Unsheltered homelessness 12/16/2017    Orientation RESPIRATION BLADDER Height & Weight     Self, Time, Situation, Place  Normal Incontinent Weight: 86.2 kg Height:  5\' 6"  (167.6 cm)  BEHAVIORAL SYMPTOMS/MOOD NEUROLOGICAL BOWEL NUTRITION STATUS      Continent  (See Discharge Summary)  AMBULATORY STATUS COMMUNICATION OF NEEDS Skin   Extensive Assist Verbally Normal                       Personal Care Assistance Level of Assistance  Bathing, Feeding, Dressing Bathing Assistance: Limited assistance Feeding assistance: Limited assistance Dressing Assistance: Limited assistance     Functional Limitations Info  Sight, Hearing, Speech Sight Info: Adequate  Hearing Info: Adequate Speech Info: Adequate    SPECIAL CARE FACTORS FREQUENCY  PT (By licensed PT), OT (By licensed OT)     PT Frequency: 2-3 weekly OT Frequency: 2-3 weekly            Contractures Contractures Info: Not present    Additional Factors Info  Code  Status, Allergies Code Status Info: Full Code Allergies Info: Bee Venom, Codeine, Novocain (Procaine), Sulfa Antibiotics           Current Medications (08/05/2023):  This is the current hospital active medication list Current Facility-Administered Medications  Medication Dose Route Frequency Provider Last Rate Last Admin   acetaminophen (TYLENOL) tablet 650 mg  650 mg Oral Q6H PRN Verdene Lennert, MD   650 mg at 08/04/23 1405   Or   acetaminophen (TYLENOL) suppository 650 mg  650 mg Rectal Q6H PRN Verdene Lennert, MD       albuterol (PROVENTIL) (2.5 MG/3ML) 0.083% nebulizer solution 2.5 mg  2.5 mg Nebulization Q4H PRN Arnetha Courser, MD       atorvastatin (LIPITOR) tablet 40 mg  40 mg Oral Daily Verdene Lennert, MD   40 mg at 08/05/23 0850   citalopram (CELEXA) tablet 20 mg  20 mg Oral Daily Verdene Lennert, MD   20 mg at 08/05/23 0850   empagliflozin (JARDIANCE) tablet 10 mg  10 mg Oral QAC breakfast Arrien, York Ram, MD   10 mg at 08/05/23 0852   enoxaparin (LOVENOX) injection 40 mg  40 mg Subcutaneous QHS Arnetha Courser, MD   40 mg at 08/04/23 2149   furosemide (LASIX) tablet 20 mg  20 mg Oral Daily Arrien, York Ram, MD   20 mg at 08/05/23 0850   gabapentin (NEURONTIN) capsule 300 mg  300 mg Oral BID Verdene Lennert, MD   300 mg at 08/05/23 0850   lactulose (CHRONULAC) 10 GM/15ML solution 30 g  30 g Oral TID Arnetha Courser, MD   30 g at 08/05/23 1022   losartan (COZAAR) tablet 50 mg  50 mg Oral Daily Arnetha Courser, MD   50 mg at 08/04/23 0840   ondansetron (ZOFRAN) tablet 4 mg  4 mg Oral Q6H PRN Verdene Lennert, MD       Or   ondansetron (ZOFRAN) injection 4 mg  4 mg Intravenous Q6H PRN Verdene Lennert, MD       pantoprazole (PROTONIX) EC tablet 40 mg  40 mg Oral Daily Verdene Lennert, MD   40 mg at 08/05/23 0850   QUEtiapine (SEROQUEL) tablet 50 mg  50 mg Oral QHS Verdene Lennert, MD   50 mg at 08/04/23 2149   rifaximin (XIFAXAN) tablet 550 mg  550 mg Oral BID Verdene Lennert, MD   550 mg at 08/05/23 0850   sodium chloride flush (NS) 0.9 % injection 3 mL  3 mL Intravenous Q12H Verdene Lennert, MD   3 mL at 08/05/23 5784   spironolactone (ALDACTONE) tablet 12.5 mg  12.5 mg Oral Daily Arrien, York Ram, MD   12.5 mg at 08/05/23 6962     Discharge Medications: Please see discharge summary for a list of discharge medications. TAKE these medications     atorvastatin 40 MG tablet Commonly known as: Lipitor Take 1 tablet (40 mg total) by mouth daily.    citalopram 20 MG tablet Commonly known as: CeleXA Take 1 tablet (20 mg total) by mouth daily.    empagliflozin 10 MG Tabs tablet Commonly known as: Jardiance Take 1 tablet (10 mg total) by  mouth daily before breakfast.    furosemide 20 MG tablet Commonly known as: LASIX Take 1 tablet (20 mg total) by mouth daily.    gabapentin 300 MG capsule Commonly known as: NEURONTIN Take 300 mg by mouth 2 (two) times daily.    lactulose 10 GM/15ML solution Commonly known as: CHRONULAC Take 45 mLs (30 g total) by mouth 3 (three) times daily.    losartan 50 MG tablet Commonly known as: COZAAR Take 1 tablet (50 mg total) by mouth daily.    methocarbamol 500 MG tablet Commonly known as: ROBAXIN Take 1 tablet (500 mg total) by mouth every 6 (six) hours as needed for muscle spasms.    multivitamin with minerals Tabs tablet Take 1 tablet by mouth daily.    ondansetron 4 MG tablet Commonly known as: ZOFRAN Take 1 tablet (4 mg total) by mouth every 6 (six) hours as needed for nausea.    pantoprazole 40 MG tablet Commonly known as: Protonix Take 1 tablet (40 mg total) by mouth daily.    QUEtiapine 50 MG tablet Commonly known as: SEROQUEL Take 1 tablet (50 mg total) by mouth at bedtime.    senna 8.6 MG Tabs tablet Commonly known as: SENOKOT Take 1 tablet (8.6 mg total) by mouth 2 (two) times daily as needed for mild constipation.    spironolactone 25 MG tablet Commonly known as: ALDACTONE Take  0.5 tablets (12.5 mg total) by mouth daily.    Xifaxan 550 MG Tabs tablet Generic drug: rifaximin Take 1 tablet (550 mg total) by mouth 2 (two) times daily.              Relevant Imaging Results:  Relevant Lab Results:   Additional Information [id number]  Garret Reddish, RN

## 2023-08-05 NOTE — TOC Transition Note (Signed)
Transition of Care [facility]) - CM/SW Discharge Note   Patient Details  Name: [name] MRN: [id number] Date of Birth: 06-Apr-1951  [facility]) CM/SW Contact:  Garret Reddish, RN Phone Number: 08/05/2023, 11:21 AM   Clinical Narrative:    Chart reviewed.  Noted that patient has discharge orders for today.   I have spoken with Mosie Epstein of the [facility]. He informs that patient is return back to the facility. Doristine Mango has requested that report be called to [phone]. Doristine Mango informs me that he will pick up patient today at 12 noon.  I have faxed Doristine Mango patient's Fl 2 and Discharge summary.  I have informed patient that Mosie Epstein of the Group Home will be transporting patient to the facility today.    I have informed staff nurse of the above information.     Final next level of care: Home w Home Health Services Barriers to Discharge: No Barriers Identified   Patient Goals and CMS Choice CMS Medicare.gov Compare Post Acute Care list provided to:: Patient Choice offered to / list presented to : Patient  Discharge Placement                Patient chooses bed at:  [facility]) Patient to be transferred to facility by: Group Home will transport patient to the facility today   Patient and family notified of of transfer: 08/05/23  Discharge Plan and Services Additional resources added to the After Visit Summary for                            [facility] Arranged: PT, OT [facility] Agency: [facility] Date HH Agency Contacted: 08/02/23   Representative spoke with at [facility] Agency: Joylene Draft  Social Determinants of Health (SDOH) Interventions SDOH Screenings   Food Insecurity: Food Insecurity Present (07/30/2023)  Housing: Low Risk  (07/30/2023)  Recent Concern: Housing - High Risk (06/18/2023)  Transportation Needs: No Transportation Needs (07/30/2023)  Recent Concern: Transportation Needs - Unmet Transportation Needs  (07/01/2023)  Utilities: Not At Risk (07/30/2023)  Recent Concern: Utilities - At Risk (07/01/2023)  Alcohol Screen: Low Risk  (09/24/2022)  Depression (PHQ2-9): High Risk (04/26/2022)  Financial Resource Strain: High Risk (04/27/2022)  Physical Activity: Sufficiently Active (04/16/2018)  Social Connections: Moderately Isolated (04/30/2018)  Stress: Stress Concern Present (04/16/2018)  Tobacco Use: High Risk (07/30/2023)     Readmission Risk Interventions    06/18/2023   12:36 PM 11/07/2022    1:45 PM  Readmission Risk Prevention Plan  Transportation Screening Complete Complete  Medication Review [employer]) Complete   PCP or Specialist appointment within 3-5 days of discharge Complete   HRI or Home Care Consult Complete   SW Recovery Care/Counseling Consult Not Complete Complete  Palliative Care Screening Not Complete Not Applicable  Skilled Nursing Facility Complete Not Applicable

## 2023-08-05 NOTE — Plan of Care (Signed)

## 2023-08-05 NOTE — Discharge Summary (Signed)
Physician Discharge Summary  [name] HKV:[id number] DOB: December 06, 1950 DOA: 07/29/2023  PCP: [name], MD  Admit date: 07/29/2023 Discharge date: 08/05/2023 Recommendations for Outpatient Follow-up:  Follow up with PCP in 1 weeks-call for appointment Please obtain BMP/CBC in one week  Discharge Dispo: Group Home Discharge Condition: Stable Code Status:   Code Status: Full Code Diet recommendation:  Diet Order             Diet heart healthy/carb modified Room service appropriate? Yes; Fluid consistency: Thin  Diet effective now                    Brief/Interim Summary: [name] was admitted to the hospital with the working diagnosis of acute metabolic encephalopathy.   72 y.o. 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   Apparently patient was not taking his lactulose over the past 1 month because of getting diarrhea with that.  Progressive generalized weakness.  On arrival to the ED, patient was hypertensive at 180/76 with heart rate of 51. He was saturating at 99% on room air. He was afebrile. Initial workup notable for WBC of 2.5, hemoglobin of 13.2, platelets of 75, creatinine 0.36 with GFR 55 and ammonia of 98. Lactic acid 1.8. Urinalysis with glucosuria and rare bacteria but no leukocytes, nitrites or WBCs/hpf.  CT of the head was obtained with no acute intracranial abnormality. Chest x-ray was obtained with no active disease.  Patient started on lactulose. Mentation improving after having bowel movement but continued to feel very weak and did not want to go to group home, given multiple limiting comorbidities palliative care was consulted. labs with mild worsening of pancytopenia, slight increase in creatinine to 1.33.   Encourage p.o. hydration.  PT is recommending SNF.  TOC was consulted for placement. 2/5: Vital stable, increased somnolence, worsening of creatinine at 1.41 and ammonia at 182, increasing the dose of lactulose.  PT recommendations changed to home health.  Stable pancytopenia.  Also giving some IV fluid. 12/6-more alert awake and wanting to return to group home and has been stable and waiting for placement since over the weekend with plan for transfer to group home on 12/9. He is alert awake oriented he feels ready for discharge back to group home today   Discharge Diagnoses:  Principal Problem:   Acute hepatic encephalopathy (HCC) Active Problems:   AKI (acute kidney injury) (HCC)   Sinus bradycardia   Pancytopenia (HCC)   COPD (chronic obstructive pulmonary disease) (HCC)   HTN (hypertension)   Chronic diastolic CHF (congestive heart failure) (HCC)   Type 2 diabetes mellitus with hyperlipidemia (HCC)   Depression   Hyperammonemia (HCC)   Hepatic encephalopathy (HCC)  Acute hepatic encephalopathy [place] systemic hepatic encephalopathy, clinically resolved.  Continue lactulose to target 2-3 bowel movement a day, continue rifaximin, supportive care and back to group home  Liver cirrhosis due to hepatitis C  Resume diuretics upon discharge   AKI Improved continue home regimen check diuretics and lactulose   Sinus bradycardia Long-term history of a stable bradycardia. EKG 53 bpm, normal axis, normal intervals, atrial rhythm with PVC, with no significant ST segment or T wave changes.  Asymptomatic and stable   Chronic pancytopenia (HCC) Due to liver disease, follow-up outpatient  COPD: Not acute  exacerbation, continue with bronchodilator therapy.  HTN Stable, continue blood pressure control with losartan.   Chronic diastolic CHF  No signs of exacerbation currently continue losartan Aldactone SGLT 2 inh.   Type 2 diabetes mellitus with hyperlipidemia Currently well-controlled  resume home regimen upon discharge  HLD: Continue statin.    Depression Continue with quetiapine and citalopram.  Consults: Palliative care Subjective: Alert awake resting comfortably eager to be discharged today  Discharge Exam: Vitals:   08/05/23 0050 08/05/23 0809  BP: (!) 107/50 (!) 114/51  Pulse: (!) 54 (!) 47  Resp: 18 16  Temp:  97.8 F (36.6 C)  SpO2: 98% 100%   General: Pt is alert, awake, not in acute distress Cardiovascular: RRR, S1/S2 +, no rubs, no gallops Respiratory: CTA bilaterally, no wheezing, no rhonchi Abdominal: Soft, NT, ND, bowel sounds + Extremities: no edema, no cyanosis  Discharge Instructions  Discharge Instructions     Discharge instructions   Complete by: As directed    Please call call MD or return to ER for similar or worsening recurring problem that brought you to hospital or if any fever,nausea/vomiting,abdominal pain, uncontrolled pain, chest pain,  shortness of breath or any other alarming symptoms.  Please follow-up your doctor as instructed in a week time and call the office for appointment.  Please avoid alcohol, smoking, or any other illicit substance and maintain healthy habits including taking your regular medications as prescribed.  You were cared for by a hospitalist during your hospital stay. If you have any questions about your discharge medications or the care you received while you were in the hospital after you are discharged, you can call the unit and ask to speak with the hospitalist on call if the hospitalist that took care of you is not available.  Once you are discharged, your primary care physician will handle any further medical issues. Please note that NO REFILLS for any discharge medications will be authorized once you are discharged, as it is imperative that you return to your primary care physician (or establish a relationship with a primary care physician if you do not have one) for your aftercare needs so that they  can reassess your need for medications and monitor your lab values   Increase activity slowly   Complete by: As directed       Allergies as of 08/05/2023       Reactions   Bee Venom Anaphylaxis   Codeine Itching   Only itching. Recently allergy tested at [facility]   Novocain [procaine]    Sulfa Antibiotics Other (See Comments)        Medication List     STOP taking these medications    oxyCODONE 5 MG immediate release tablet Commonly known as: Oxy IR/ROXICODONE       TAKE these medications    atorvastatin 40 MG tablet Commonly known as: Lipitor Take 1 tablet (40 mg total) by mouth daily.   citalopram 20 MG tablet Commonly known as: CeleXA Take 1 tablet (20 mg total) by mouth daily.   empagliflozin 10 MG Tabs tablet Commonly known as: Jardiance Take 1 tablet (10 mg total) by mouth daily before breakfast.   furosemide 20 MG tablet Commonly known as: LASIX Take 1 tablet (20 mg total) by mouth daily.   gabapentin 300 MG capsule Commonly known as: NEURONTIN Take 300 mg by mouth 2 (two) times daily.   lactulose 10 GM/15ML solution Commonly known as: CHRONULAC Take 45 mLs (30 g total) by mouth 3 (three)  times daily.   losartan 50 MG tablet Commonly known as: COZAAR Take 1 tablet (50 mg total) by mouth daily.   methocarbamol 500 MG tablet Commonly known as: ROBAXIN Take 1 tablet (500 mg total) by mouth every 6 (six) hours as needed for muscle spasms.   multivitamin with minerals Tabs tablet Take 1 tablet by mouth daily.   ondansetron 4 MG tablet Commonly known as: ZOFRAN Take 1 tablet (4 mg total) by mouth every 6 (six) hours as needed for nausea.   pantoprazole 40 MG tablet Commonly known as: Protonix Take 1 tablet (40 mg total) by mouth daily.   QUEtiapine 50 MG tablet Commonly known as: SEROQUEL Take 1 tablet (50 mg total) by mouth at bedtime.   senna 8.6 MG Tabs tablet Commonly known as: SENOKOT Take 1 tablet (8.6 mg total) by mouth 2 (two) times  daily as needed for mild constipation.   spironolactone 25 MG tablet Commonly known as: ALDACTONE Take 0.5 tablets (12.5 mg total) by mouth daily.   Xifaxan 550 MG Tabs tablet Generic drug: rifaximin Take 1 tablet (550 mg total) by mouth 2 (two) times daily.        Follow-up Information     [name], MD Follow up in 1 week(s).   Specialty: Family Medicine Contact information: [address] [place] [address] [phone]                Allergies  Allergen Reactions   Bee Venom Anaphylaxis   Codeine Itching    Only itching. Recently allergy tested at [facility]   Novocain [Procaine]    Sulfa Antibiotics Other (See Comments)    The results of significant diagnostics from this hospitalization (including imaging, microbiology, ancillary and laboratory) are listed below for reference.    Microbiology: Recent Results (from the past 240 hour(s))  Blood Culture (routine x 2)     Status: None   Collection Time: 07/29/23  2:07 PM   Specimen: BLOOD  Result Value Ref Range Status   Specimen Description BLOOD BLOOD RIGHT ARM  Final   Special Requests   Final    BOTTLES DRAWN AEROBIC AND ANAEROBIC Blood Culture results may not be optimal due to an inadequate volume of blood received in culture bottles   Culture   Final    NO GROWTH 5 DAYS Performed at [facility], [address], [place], [place] [address]    Report Status 08/03/2023 FINAL  Final  Blood Culture (routine x 2)     Status: None   Collection Time: 07/29/23 11:39 PM   Specimen: BLOOD  Result Value Ref Range Status   Specimen Description BLOOD BLOOD RIGHT HAND  Final   Special Requests   Final    BOTTLES DRAWN AEROBIC AND ANAEROBIC Blood Culture adequate volume   Culture   Final    NO GROWTH 5 DAYS Performed at [facility], [address], [place], [place] [address]    Report Status 08/03/2023 FINAL  Final    Procedures/Studies: [facility] Chest Port 1 View  Result Date:  07/29/2023 CLINICAL DATA:  Altered mental status since yesterday. Unable to stand. Left arm weakness. Fell yesterday with head trauma EXAM: PORTABLE CHEST 1 VIEW COMPARISON:  06/16/2023 FINDINGS: Stable cardiomediastinal silhouette. Aortic atherosclerotic calcification. No focal consolidation, pleural effusion, or pneumothorax. No displaced rib fractures. IMPRESSION: No active disease. Electronically Signed   By: Minerva Fester M.D.   On: 07/29/2023 15:45   CT Head Wo Contrast  Result  Date: 07/29/2023 CLINICAL DATA:  Altered mental status, left arm weakness, fall EXAM: CT HEAD WITHOUT CONTRAST TECHNIQUE: Contiguous axial images were obtained from the base of the skull through the vertex without intravenous contrast. RADIATION DOSE REDUCTION: This exam was performed according to the departmental dose-optimization program which includes automated exposure control, adjustment of the mA and/or kV according to patient size and/or use of iterative reconstruction technique. COMPARISON:  06/17/2023 FINDINGS: Brain: No evidence of acute infarction, hemorrhage, mass, mass effect, or midline shift. No hydrocephalus or extra-axial fluid collection. Vascular: No hyperdense vessel. Skull: Negative for fracture or focal lesion. Sinuses/Orbits: Minimal mucosal thickening in the left maxillary sinus and ethmoid air cells. No acute finding in the orbits. Other: The mastoid air cells are well aerated. IMPRESSION: No acute intracranial process. Electronically Signed   By: Wiliam Ke M.D.   On: 07/29/2023 13:08    Labs: BNP (last 3 results) Recent Labs    01/16/23 1047 01/17/23 1214 03/23/23 1046  BNP 530.9* 537.9* 1,563.3*   Basic Metabolic Panel: Recent Labs  Lab 07/29/23 1216 07/30/23 0636 07/31/23 0426 08/01/23 0422 08/03/23 0223  NA 138 137 136 137 135  K 3.9 3.5 3.6 4.3 4.0  CL 107 107 107 106 106  CO2 25 24 24 24 25   GLUCOSE 119* 78 108* 104* 98  BUN 20 20 23 22 20   CREATININE 1.36* 1.28* 1.33*  1.41* 1.14  CALCIUM 9.6 9.5 9.1 9.3 9.4   Liver Function Tests: Recent Labs  Lab 07/29/23 1216 07/30/23 0636 08/01/23 0422 08/03/23 0223  AST 73* 66* 59* 63*  ALT 34 32 33 32  ALKPHOS 98 88 89 101  BILITOT 1.5* 1.7* 1.2* 1.2*  PROT 6.7 6.5 5.8* 5.1*  ALBUMIN 2.6* 2.5* 2.3* 2.0*   No results for input(s): "LIPASE", "AMYLASE" in the last 168 hours. Recent Labs  Lab 07/29/23 1350 08/01/23 0422 08/03/23 0223  AMMONIA 98* 182* 78*   CBC: Recent Labs  Lab 07/29/23 1216 07/30/23 0635 07/30/23 0636 07/31/23 0426 08/01/23 0422 08/03/23 0223  WBC 2.5* 2.9* 2.9* 2.6* 2.6* 2.7*  NEUTROABS 1.1* 0.9* 0.9*  --   --   --   HGB 13.2 12.4* 12.4* 11.1* 11.6* 9.8*  HCT 38.7* 35.9* 36.3* 32.1* 33.6* 29.0*  MCV 92.4 90.4 91.2 90.2 88.9 90.9  PLT 75* 69* 69* 61* 62* 52*   Cardiac Enzymes: No results for input(s): "CKTOTAL", "CKMB", "CKMBINDEX", "TROPONINI" in the last 168 hours. BNP: Invalid input(s): "POCBNP" CBG: Recent Labs  Lab 08/04/23 0742 08/04/23 1133 08/04/23 1616 08/04/23 2130 08/05/23 0804  GLUCAP 89 147* 87 110* 74   D-Dimer No results for input(s): "DDIMER" in the last 72 hours. Hgb A1c No results for input(s): "HGBA1C" in the last 72 hours. Lipid Profile No results for input(s): "CHOL", "HDL", "LDLCALC", "TRIG", "CHOLHDL", "LDLDIRECT" in the last 72 hours. Thyroid function studies No results for input(s): "TSH", "T4TOTAL", "T3FREE", "THYROIDAB" in the last 72 hours.  Invalid input(s): "FREET3" Anemia work up No results for input(s): "VITAMINB12", "FOLATE", "FERRITIN", "TIBC", "IRON", "RETICCTPCT" in the last 72 hours. Urinalysis    Component Value Date/Time   COLORURINE YELLOW (A) 07/29/2023 1234   APPEARANCEUR CLEAR (A) 07/29/2023 1234   LABSPEC 1.016 07/29/2023 1234   PHURINE 7.0 07/29/2023 1234   GLUCOSEU >=500 (A) 07/29/2023 1234   HGBUR NEGATIVE 07/29/2023 1234   BILIRUBINUR NEGATIVE 07/29/2023 1234   KETONESUR NEGATIVE 07/29/2023 1234    PROTEINUR NEGATIVE 07/29/2023 1234   NITRITE NEGATIVE 07/29/2023 1234   LEUKOCYTESUR  NEGATIVE 07/29/2023 1234   Sepsis Labs Recent Labs  Lab 07/30/23 0636 07/31/23 0426 08/01/23 0422 08/03/23 0223  WBC 2.9* 2.6* 2.6* 2.7*   Microbiology Recent Results (from the past 240 hour(s))  Blood Culture (routine x 2)     Status: None   Collection Time: 07/29/23  2:07 PM   Specimen: BLOOD  Result Value Ref Range Status   Specimen Description BLOOD BLOOD RIGHT ARM  Final   Special Requests   Final    BOTTLES DRAWN AEROBIC AND ANAEROBIC Blood Culture results may not be optimal due to an inadequate volume of blood received in culture bottles   Culture   Final    NO GROWTH 5 DAYS Performed at [facility], [address], [place], [place] [address]    Report Status 08/03/2023 FINAL  Final  Blood Culture (routine x 2)     Status: None   Collection Time: 07/29/23 11:39 PM   Specimen: BLOOD  Result Value Ref Range Status   Specimen Description BLOOD BLOOD RIGHT HAND  Final   Special Requests   Final    BOTTLES DRAWN AEROBIC AND ANAEROBIC Blood Culture adequate volume   Culture   Final    NO GROWTH 5 DAYS Performed at [facility], [address], [place], [place] [address]    Report Status 08/03/2023 FINAL  Final     Time coordinating discharge: 35 minutes  SIGNED: Lanae Boast, MD  [facility] 08/05/2023, 10:47 AM  If 7PM-7AM, please contact night-coverage [email or web address]

## 2023-08-08 ENCOUNTER — Telehealth: Payer: Self-pay

## 2023-08-08 DIAGNOSIS — G959 Disease of spinal cord, unspecified: Secondary | ICD-10-CM

## 2023-08-08 NOTE — Telephone Encounter (Signed)
Recevied message from Kennyth Arnold (physical therapist with [facility]) that she is working with Mr Roddey and she feels he would benefit from a 4 wheeled walker. Please send order to [employer].

## 2023-08-09 NOTE — Telephone Encounter (Signed)
Order has been placed.

## 2023-08-12 ENCOUNTER — Other Ambulatory Visit: Payer: Self-pay

## 2023-08-12 DIAGNOSIS — G959 Disease of spinal cord, unspecified: Secondary | ICD-10-CM

## 2023-08-13 ENCOUNTER — Ambulatory Visit (INDEPENDENT_AMBULATORY_CARE_PROVIDER_SITE_OTHER): Payer: 59 | Admitting: Neurosurgery

## 2023-08-13 ENCOUNTER — Encounter: Payer: 59 | Admitting: Neurosurgery

## 2023-08-13 VITALS — BP 118/70 | Temp 99.0°F | Ht 66.0 in | Wt 190.0 lb

## 2023-08-13 DIAGNOSIS — G959 Disease of spinal cord, unspecified: Secondary | ICD-10-CM

## 2023-08-13 DIAGNOSIS — Z09 Encounter for follow-up examination after completed treatment for conditions other than malignant neoplasm: Secondary | ICD-10-CM

## 2023-08-13 NOTE — Progress Notes (Signed)
   REFERRING PHYSICIAN:  [name], Md [address] [place],  [place] [address]  DOS: 07/01/23 C3-6 ACDF  HISTORY OF PRESENT ILLNESS: [facility] is status post ACDF for cervical myelopathy.   He is doing very well.  He has had improvement in his gait.  His strength is improved.   PHYSICAL EXAMINATION:  NEUROLOGICAL:  General: In no acute distress.   Awake, alert, oriented to person, place, and time.  Pupils equal round and reactive to light.    Strength: Side Biceps Triceps Deltoid Interossei Grip Wrist Ext. Wrist Flex.  R 5 5 5 5 5 5 5   L 5 5 5 5 5 5 5    Incision c/d/I and healing well  Imaging:  No interval imaging to review  Assessment / Plan: COLIE CASTOR is doing well after recent ACDF.  I am very pleased with his improvements.  I gave him exercises for his neck.  We will see him back in clinic in approximately 6 weeks.    Venetia Night MD Dept of Neurosurgery

## 2023-08-19 ENCOUNTER — Emergency Department
Admission: EM | Admit: 2023-08-19 | Discharge: 2023-08-19 | Disposition: A | Discharge status: Home / Self Care | Payer: 59 | Attending: Emergency Medicine | Admitting: Emergency Medicine

## 2023-08-19 ENCOUNTER — Encounter: Payer: Self-pay | Admitting: Medical Oncology

## 2023-08-19 ENCOUNTER — Other Ambulatory Visit: Payer: Self-pay

## 2023-08-19 DIAGNOSIS — R251 Tremor, unspecified: Secondary | ICD-10-CM | POA: Insufficient documentation

## 2023-08-19 LAB — BASIC METABOLIC PANEL
Anion gap: 12 (ref 5–15)
BUN: 19 mg/dL (ref 8–23)
CO2: 20 mmol/L — ABNORMAL LOW (ref 22–32)
Calcium: 9.1 mg/dL (ref 8.9–10.3)
Chloride: 108 mmol/L (ref 98–111)
Creatinine, Ser: 1.57 mg/dL — ABNORMAL HIGH (ref 0.61–1.24)
GFR, Estimated: 46 mL/min — ABNORMAL LOW (ref >60)
Glucose, Bld: 175 mg/dL — ABNORMAL HIGH (ref 70–99)
Potassium: 3.5 mmol/L (ref 3.5–5.1)
Sodium: 140 mmol/L (ref 135–145)

## 2023-08-19 LAB — CBC
HCT: 34.5 % — ABNORMAL LOW (ref 39.0–52.0)
Hemoglobin: 11.4 g/dL — ABNORMAL LOW (ref 13.0–17.0)
MCH: 31.3 pg (ref 26.0–34.0)
MCHC: 33 g/dL (ref 30.0–36.0)
MCV: 94.8 fL (ref 80.0–100.0)
Platelets: 65 10*3/uL — ABNORMAL LOW (ref 150–400)
RBC: 3.64 MIL/uL — ABNORMAL LOW (ref 4.22–5.81)
RDW: 16 % — ABNORMAL HIGH (ref 11.5–15.5)
WBC: 1.8 10*3/uL — ABNORMAL LOW (ref 4.0–10.5)
nRBC: 0 % (ref 0.0–0.2)

## 2023-08-19 NOTE — ED Triage Notes (Signed)
Pt from group home via ems with reports of hand tremors x 1 week. Pt states that he feels fine.

## 2023-08-19 NOTE — ED Notes (Signed)
See triage note  Presents with weakness  States his legs give out  He does use a walker which helps  Just saw his PCP

## 2023-08-19 NOTE — ED Provider Notes (Signed)
[facility] Provider Note    Event Date/Time   First MD Initiated Contact with Patient 08/19/23 [phone]     (approximate)   History   Tremors   HPI  [name] is a 72 y.o. male who reports that he has been having bilateral hand tremors for about 3 weeks, he notes that he had a laminectomy several months ago and reports the pain in his neck has improved.  He denies weakness or numbness and states that he has good grip strength.  He does report occasionally his legs feel like they are going to give out on him.  Sounds like his PCP has prescribed him a rollator for this.  Review of record demonstrates the patient was from the hospital on December ninth for hepatic encephalopathy, did see his neurosurgeon on the 17th and was doing well.     Physical Exam   Triage Vital Signs: ED Triage Vitals  Encounter Vitals Group     BP 08/19/23 0911 (!) 117/56     Systolic BP Percentile --      Diastolic BP Percentile --      Pulse Rate 08/19/23 0911 (!) 56     Resp 08/19/23 0909 16     Temp 08/19/23 0909 98.6 F (37 C)     Temp Source 08/19/23 0909 Oral     SpO2 08/19/23 0909 100 %     Weight 08/19/23 0909 86 kg (189 lb 9.5 oz)     Height 08/19/23 0909 1.676 m (5\' 6" )     Head Circumference --      Peak Flow --      Pain Score 08/19/23 0909 0     Pain Loc --      Pain Education --      Exclude from Growth Chart --     Most recent vital signs: Vitals:   08/19/23 0909 08/19/23 0911  BP:  (!) 117/56  Pulse:  (!) 56  Resp: 16   Temp: 98.6 F (37 C)   SpO2: 100%      General: Awake, no distress.  Alert and oriented x 4 CV:  Good peripheral perfusion.  Resp:  Normal effort.  Abd:  No distention.  Other:  Normal strength in the upper and lower extremities.  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   ED Results / Procedures / Treatments   Labs (all labs ordered are listed, but only abnormal results are displayed) Labs Reviewed   CBC - Abnormal; Notable for the following components:      Result Value   WBC 1.8 (*)    RBC 3.64 (*)    Hemoglobin 11.4 (*)    HCT 34.5 (*)    RDW 16.0 (*)    Platelets 65 (*)    All other components within normal limits  BASIC METABOLIC PANEL - Abnormal; Notable for the following components:   CO2 20 (*)    Glucose, Bld 175 (*)    Creatinine, Ser 1.57 (*)    GFR, Estimated 46 (*)    All other components within normal limits     EKG     RADIOLOGY     PROCEDURES:  Critical Care performed:   Procedures   MEDICATIONS ORDERED IN ED: Medications - No data to display   IMPRESSION / MDM / ASSESSMENT AND PLAN / ED COURSE  I reviewed the triage vital signs and the nursing notes. Patient's presentation is most consistent with exacerbation  of chronic illness.  Patient presents with tremors as detailed above, electrolytes not significantly changed from prior, CBC unchanged from admission.  No neurodeficits, mild tremor noted.  Afebrile overall well-appearing.  No indication for admission at this time, referral to neurology, and outpatient follow-up with PCP        FINAL CLINICAL IMPRESSION(S) / ED DIAGNOSES   Final diagnoses:  Tremor     Rx / DC Orders   ED Discharge Orders     None        Note:  This document was prepared using Dragon voice recognition software and may include unintentional dictation errors.   Jene Every, MD 08/19/23 1001

## 2023-08-22 ENCOUNTER — Emergency Department
Admission: EM | Admit: 2023-08-22 | Discharge: 2023-08-22 | Disposition: A | Discharge status: Home / Self Care | Payer: 59 | Attending: Emergency Medicine | Admitting: Emergency Medicine

## 2023-08-22 ENCOUNTER — Other Ambulatory Visit: Payer: Self-pay

## 2023-08-22 DIAGNOSIS — R001 Bradycardia, unspecified: Secondary | ICD-10-CM | POA: Diagnosis not present

## 2023-08-22 DIAGNOSIS — R531 Weakness: Secondary | ICD-10-CM | POA: Diagnosis present

## 2023-08-22 DIAGNOSIS — D72819 Decreased white blood cell count, unspecified: Secondary | ICD-10-CM | POA: Insufficient documentation

## 2023-08-22 DIAGNOSIS — I493 Ventricular premature depolarization: Secondary | ICD-10-CM | POA: Insufficient documentation

## 2023-08-22 LAB — URINALYSIS, ROUTINE W REFLEX MICROSCOPIC
Bacteria, UA: NONE SEEN
Bilirubin Urine: NEGATIVE
Glucose, UA: >=500 mg/dL — AB
Hgb urine dipstick: NEGATIVE
Ketones, ur: NEGATIVE mg/dL
Leukocytes,Ua: NEGATIVE
Nitrite: NEGATIVE
Protein, ur: NEGATIVE mg/dL
Specific Gravity, Urine: 1.011 (ref 1.005–1.030)
Squamous Epithelial / HPF: 0 /[HPF] (ref 0–5)
pH: 6 (ref 5.0–8.0)

## 2023-08-22 LAB — CBC
HCT: 38.8 % — ABNORMAL LOW (ref 39.0–52.0)
Hemoglobin: 13.2 g/dL (ref 13.0–17.0)
MCH: 31.3 pg (ref 26.0–34.0)
MCHC: 34 g/dL (ref 30.0–36.0)
MCV: 91.9 fL (ref 80.0–100.0)
Platelets: 67 10*3/uL — ABNORMAL LOW (ref 150–400)
RBC: 4.22 MIL/uL (ref 4.22–5.81)
RDW: 16.1 % — ABNORMAL HIGH (ref 11.5–15.5)
WBC: 2.5 10*3/uL — ABNORMAL LOW (ref 4.0–10.5)
nRBC: 0 % (ref 0.0–0.2)

## 2023-08-22 LAB — BASIC METABOLIC PANEL
Anion gap: 8 (ref 5–15)
BUN: 19 mg/dL (ref 8–23)
CO2: 23 mmol/L (ref 22–32)
Calcium: 9.7 mg/dL (ref 8.9–10.3)
Chloride: 108 mmol/L (ref 98–111)
Creatinine, Ser: 1.4 mg/dL — ABNORMAL HIGH (ref 0.61–1.24)
GFR, Estimated: 53 mL/min — ABNORMAL LOW (ref >60)
Glucose, Bld: 104 mg/dL — ABNORMAL HIGH (ref 70–99)
Potassium: 3.6 mmol/L (ref 3.5–5.1)
Sodium: 139 mmol/L (ref 135–145)

## 2023-08-22 LAB — TROPONIN I (HIGH SENSITIVITY): Troponin I (High Sensitivity): 16 ng/L (ref <18)

## 2023-08-22 LAB — BRAIN NATRIURETIC PEPTIDE: B Natriuretic Peptide: 252.4 pg/mL — ABNORMAL HIGH (ref 0.0–100.0)

## 2023-08-22 NOTE — ED Triage Notes (Signed)
First RN: Pt here from [facility] with weakness. Pt was here for same recently. Pt states nothing has changed since the last visit. Pt also just diagnosed with CHF but not on medication yet.   48 98% RA

## 2023-08-22 NOTE — ED Notes (Signed)
The pt was able to ambulate to the restroom with assistance. The staff member at the facility was notified the pt could ambulate.

## 2023-08-22 NOTE — ED Triage Notes (Signed)
See first nurse note. Pt reports continued weakness for the past few days. Recently seen for same. Also reports back pain. Denies recent falls.

## 2023-08-22 NOTE — ED Provider Notes (Signed)
[facility] Provider Note    Event Date/Time   First MD Initiated Contact with Patient 08/22/23 1459     (approximate)   History   Weakness   HPI  [name] is a 72 y.o. male who presents to the emergency department today with primary concerns for generalized weakness.  He says that this has been going on over the past few days.  This morning he felt extremely weak in all of his extremities.  He denies any associated fevers or chest pain.  He does state he has noticed some bad odor to his urine recently.  Per chart review he was seen in the emergency department a few days ago and had blood work checked which did not show any acute abnormalities for the patient.     Physical Exam   Triage Vital Signs: ED Triage Vitals  Encounter Vitals Group     BP 08/22/23 1123 (!) 179/85     Systolic BP Percentile --      Diastolic BP Percentile --      Pulse Rate 08/22/23 1125 (!) 51     Resp 08/22/23 1122 18     Temp 08/22/23 1122 98 F (36.7 C)     Temp src --      SpO2 08/22/23 1125 100 %     Weight 08/22/23 1122 189 lb 9.5 oz (86 kg)     Height 08/22/23 1122 5\' 6"  (1.676 m)     Head Circumference --      Peak Flow --      Pain Score 08/22/23 1122 5     Pain Loc --      Pain Education --      Exclude from Growth Chart --     Most recent vital signs: Vitals:   08/22/23 1123 08/22/23 1125  BP: (!) 179/85   Pulse:  (!) 51  Resp:    Temp:    SpO2:  100%   General: Awake, alert, oriented. CV:  Good peripheral perfusion. Bradycardia. Resp:  Normal effort. Lungs clear. Abd:  No distention. Non tender.   ED Results / Procedures / Treatments   Labs (all labs ordered are listed, but only abnormal results are displayed) Labs Reviewed  CBC - Abnormal; Notable for the following components:      Result Value   WBC 2.5 (*)    HCT 38.8 (*)    RDW 16.1 (*)    Platelets 67 (*)    All other components within normal limits  BASIC METABOLIC PANEL -  Abnormal; Notable for the following components:   Glucose, Bld 104 (*)    Creatinine, Ser 1.40 (*)    GFR, Estimated 53 (*)    All other components within normal limits  URINALYSIS, ROUTINE W REFLEX MICROSCOPIC - Abnormal; Notable for the following components:   Color, Urine YELLOW (*)    APPearance CLEAR (*)    Glucose, UA >=500 (*)    All other components within normal limits  BRAIN NATRIURETIC PEPTIDE - Abnormal; Notable for the following components:   B Natriuretic Peptide 252.4 (*)    All other components within normal limits  TROPONIN I (HIGH SENSITIVITY)     EKG  I, Phineas Semen, attending physician, personally viewed and interpreted this EKG  EKG Time: 1131 Rate: 42 Rhythm: sinus bradycardia Axis: normal Intervals: qtc 387 QRS: narrow ST changes: no st elevation Impression: abnormal ekg   RADIOLOGY None   PROCEDURES:  Critical Care  performed: No  MEDICATIONS ORDERED IN ED: Medications - No data to display   IMPRESSION / MDM / ASSESSMENT AND PLAN / ED COURSE  I reviewed the triage vital signs and the nursing notes.                              Differential diagnosis includes, but is not limited to, anemia, electrolyte abnormality, dehydration, infection, ACS  Patient's presentation is most consistent with acute presentation with potential threat to life or bodily function.   The patient is on the cardiac monitor to evaluate for evidence of arrhythmia and/or significant heart rate changes.  Patient presented to the emergency department today because of concerns for weakness.  On exam patient is awake alert and oriented.  Lungs clear.  Patient was afebrile.  Blood work with leukopenia although this appears to be patient's baseline.  No concerning electrolyte abnormalities.  Given he is having some bad odor to his urine will check UA for possible infection.  Additionally will add on troponin and BNP.  Troponin without concerning elevation. BNP is  slightly elevated but appears better then patient's normal result. UA without findings concerning for infection. At this time I think it is reasonable for patient to be discharged. Do think it would be reasonable for patient to follow up with PCP.      FINAL CLINICAL IMPRESSION(S) / ED DIAGNOSES   Final diagnoses:  Weakness    Note:  This document was prepared using Dragon voice recognition software and may include unintentional dictation errors.    Phineas Semen, MD 08/22/23 [phone]

## 2023-08-22 NOTE — ED Notes (Signed)
Report was called at [facility] and I spoke with [facility].

## 2023-08-22 NOTE — ED Notes (Signed)
Patient departed with [facility] to be taken back to home at this time.

## 2023-08-22 NOTE — Discharge Instructions (Signed)
Please seek medical attention for any high fevers, chest pain, shortness of breath, change in behavior, persistent vomiting, bloody stool or any other new or concerning symptoms.  

## 2023-08-27 ENCOUNTER — Encounter: Payer: Self-pay | Admitting: Family

## 2023-08-27 ENCOUNTER — Ambulatory Visit: Payer: 59 | Attending: Family | Admitting: Family

## 2023-08-27 VITALS — BP 164/60 | HR 48 | Wt 192.0 lb

## 2023-08-27 DIAGNOSIS — I10 Essential (primary) hypertension: Secondary | ICD-10-CM | POA: Diagnosis not present

## 2023-08-27 DIAGNOSIS — G9589 Other specified diseases of spinal cord: Secondary | ICD-10-CM | POA: Diagnosis not present

## 2023-08-27 DIAGNOSIS — Z8673 Personal history of transient ischemic attack (TIA), and cerebral infarction without residual deficits: Secondary | ICD-10-CM | POA: Insufficient documentation

## 2023-08-27 DIAGNOSIS — I110 Hypertensive heart disease with heart failure: Secondary | ICD-10-CM | POA: Insufficient documentation

## 2023-08-27 DIAGNOSIS — R188 Other ascites: Secondary | ICD-10-CM

## 2023-08-27 DIAGNOSIS — F419 Anxiety disorder, unspecified: Secondary | ICD-10-CM | POA: Diagnosis not present

## 2023-08-27 DIAGNOSIS — B1920 Unspecified viral hepatitis C without hepatic coma: Secondary | ICD-10-CM | POA: Insufficient documentation

## 2023-08-27 DIAGNOSIS — E119 Type 2 diabetes mellitus without complications: Secondary | ICD-10-CM | POA: Diagnosis not present

## 2023-08-27 DIAGNOSIS — I5032 Chronic diastolic (congestive) heart failure: Secondary | ICD-10-CM | POA: Insufficient documentation

## 2023-08-27 DIAGNOSIS — K7460 Unspecified cirrhosis of liver: Secondary | ICD-10-CM | POA: Diagnosis not present

## 2023-08-27 DIAGNOSIS — G4733 Obstructive sleep apnea (adult) (pediatric): Secondary | ICD-10-CM | POA: Diagnosis not present

## 2023-08-27 DIAGNOSIS — I428 Other cardiomyopathies: Secondary | ICD-10-CM | POA: Insufficient documentation

## 2023-08-27 DIAGNOSIS — R0602 Shortness of breath: Secondary | ICD-10-CM | POA: Diagnosis present

## 2023-08-27 DIAGNOSIS — M4802 Spinal stenosis, cervical region: Secondary | ICD-10-CM | POA: Insufficient documentation

## 2023-08-27 DIAGNOSIS — F32A Depression, unspecified: Secondary | ICD-10-CM | POA: Diagnosis not present

## 2023-08-27 DIAGNOSIS — Z981 Arthrodesis status: Secondary | ICD-10-CM | POA: Diagnosis not present

## 2023-08-27 DIAGNOSIS — G959 Disease of spinal cord, unspecified: Secondary | ICD-10-CM

## 2023-08-27 MED ORDER — SPIRONOLACTONE 25 MG PO TABS: 25.0000 mg | ORAL_TABLET | Freq: Every day | ORAL | 3 refills | Status: DC

## 2023-08-27 NOTE — Progress Notes (Signed)
 PCP: Carlin Bard Bernerd Bernardino (last seen ~ 2 weeks ago) Primary Cardiologist: Perla Lye, MD (last seen 02/22)  Chief Complaint: shortness of breath   HPI:  [name] is a 72 y/o male with a history of DM, HTN, COPD, migraines, hepatitis C, w/ likely underlying HCC, cirrhosis, TIA, depression, anxiety obstructive sleep apnea, cervical stenosis status post C3-6 ACDF and chronic heart failure.  Admitted 06/17/23 due to dysuria, was found to have E. coli bacteremia. Having some falls, patient having abdominal pain and weight loss. Abd/ pelvic CT stable. MRI of cervical spine shows C4-5 severe spinal stenosis with cord compression. Neurosurgery consulted. Antibiotics provided. Admitted 07/01/23 due to cervical myelopathy and cervical stenosis status post C3-6 ACDF. He was admitted overnight for pain control, therapy evaluation, and drain output monitoring. His postoperative course was complicated by worsening thrombocytopenia which improved back to his baseline with 1 unit of platelets. His drain output reduced to an acceptable level and was removed on the afternoon of postop day 1 and discharged the following day.   Admitted 07/29/23 due to AMS and a fall. Had not been taking his lactulose  due to diarrhea & generalized weakness. CXR normal. Head CT normal. Palliative care consulted due to multiple comorbidities. PT consulted. Mental status improved and discharged to group home. 2 December ED visits due to weakness/ tremors.   Echo 03/20/18: EF of 60-65% along with mild/moderate Drew and a mildly elevated PA pressure of 42 mm Hg Echo 04/01/21: EF of 55-60% along with mild LVH/ LAE. Echo 03/25/22: EF 60-65% Echo 12/24/22: EF 60-65% along with mild LVH, mild LAE.    He presents today for a HF follow-up visit with a chief complaint of minimal shortness of breath with moderate exertion. Chronic in nature. Has associated fatigue, back pain, chest pain, neck pain and back pain along with this. Denies  palpitations, cough, abdominal distention, pedal edema, dizziness, weight gain or difficulty sleeping. When he experiences chest pain, he will take a SL NTG with relief.   BP at group home has been running high with SBP up to low 190's.   ROS: All systems negative except as listed in HPI, PMH and Problem List.  SH:  Social History   Socioeconomic History   Marital status: Single    Spouse name: Not on file   Number of children: 2   Years of education: 12   Highest education level: 12th grade  Occupational History   Occupation: retired  Tobacco Use   Smoking status: Some Days    Types: Cigarettes   Smokeless tobacco: Never   Tobacco comments:    1-3 cigarettes per month  Vaping Use   Vaping status: Not on file  Substance and Sexual Activity   Alcohol use: Never   Drug use: Never   Sexual activity: Not Currently    Birth control/protection: None  Other Topics Concern   Not on file  Social History Narrative   Lives in [place] care home   Social Drivers of Health   Financial Resource Strain: High Risk (04/27/2022)   Overall Financial Resource Strain (CARDIA)    Difficulty of Paying Living Expenses: Very hard  Food Insecurity: Food Insecurity Present (07/30/2023)   Hunger Vital Sign    Worried About Running Out of Food in the Last Year: Sometimes true    Ran Out of Food in the Last Year: Sometimes true  Transportation Needs: No Transportation Needs (07/30/2023)   PRAPARE - Administrator, Civil Service (Medical): No  Lack of Transportation (Non-Medical): No  Recent Concern: Transportation Needs - Unmet Transportation Needs (07/01/2023)   PRAPARE - Transportation    Lack of Transportation (Medical): Yes    Lack of Transportation (Non-Medical): Yes  Physical Activity: Sufficiently Active (04/16/2018)   Exercise Vital Sign    Days of Exercise per Week: 7 days    Minutes of Exercise per Session: 30 min  Stress: Stress Concern Present (04/16/2018)   [employer] of Occupational [employer] - Occupational Stress Questionnaire    Feeling of Stress : Very much  Social Connections: Moderately Isolated (04/30/2018)   Social Connection and Isolation Panel [NHANES]    Frequency of Communication with Friends and Family: Twice a week    Frequency of Social Gatherings with Friends and Family: Never    Attends Religious Services: More than 4 times per year    Active Member of [employer] or Organizations: No    Attends Banker Meetings: Never    Marital Status: Widowed  Intimate Partner Violence: Not At Risk (07/30/2023)   [name], Afraid, [name], and Kick questionnaire    Fear of Current or Ex-Partner: No    Emotionally Abused: No    Physically Abused: No    Sexually Abused: No    FH:  Family History  Problem Relation Age of Onset   Hypertension Mother    Heart disease Mother    Heart failure Maternal Grandmother     Past Medical History:  Diagnosis Date   (HFpEF) heart failure with preserved ejection fraction (HCC)    a.) TTE 03/20/2018: EF 60-65%, no RWMAs, mild LAE, mild-mod Drew, PASP 42; b.) TTE 03/31/2021: EF 55-60%, no RWMAs, mild LVH, mild LAE, AoV sclerosis without stenosis; c.) TTE 03/25/2022: EF 60-65%, no RWMAs, AoV sclerosis without stenosis; d.) TTE 12/24/2022: EF 60-65%, no RWMAs, mild LVH, mild LAE.   Anasarca    Anemia    Anxiety    Aortic atherosclerosis (HCC)    Arthritis    Asthma    Bacteremia due to Escherichia coli 05/2023   Bacteremia due to Klebsiella pneumoniae 2023   Bilateral carotid artery disease (HCC)    Bradycardia    CAD (coronary artery disease) 10/21/2006   a.) MV 10/21/2006: small reversible defect involving the apical  lateral wall c/w possible ischemia; b.) MV: 03/24/2018: no ischemia; c.) MV 10/04/2021: LAD calcifications   Cervical radiculopathy    Chest pain    a.) MV 03/21/2018: no ischemia; b.) MV 10/04/2021: no ischemia   Chronic back pain    Chronic common bile duct dilatation     Cirrhosis (HCC)    a.) (+) hepatic lesion on CT; likely hepatocellular carcinoma; b.) CT evidence of associated portal hypertension; c.) (+) abdominopelvic anasarca   COPD (chronic obstructive pulmonary disease) (HCC)    DDD (degenerative disc disease), lumbar    Depression    Frequent falls    GERD (gastroesophageal reflux disease)    HBV (hepatitis B virus) infection    HCV (hepatitis C virus)    Hernia of anterior abdominal wall    History of 2019 novel coronavirus disease (COVID-19) 04/16/2020   Homelessness    a.) limited on placement as patient is listed on [place] sex offender listing for exploitation of a minor (possession of child pornography); incarcerated 12 years (released 11/2017)   Hypertension    Infestation by bed bug 02/2022   Malingering    Migraine    Nephrolithiasis    Obesity    Portal hypertension (HCC)  PTSD (post-traumatic stress disorder)    a.) brief training in the [employer] at age 37; someone was killed in front of him   Pulmonary HTN (HCC)    a.) multiple CT scans desmontrating dilated PA c/w pHTN; b.) TTE 03/20/2018: PASP 42 mmHg   Sleep apnea    a.) no nocturnal PAP therapy   Stenosis of cervical spine with myelopathy (HCC)    Suicidal ideations    T2DM (type 2 diabetes mellitus) (HCC)    Thrombocytopenia (HCC)    a.) related to hepatic cirrhosis; spleen normal in size   TIA (transient ischemic attack)     Current Outpatient Medications  Medication Sig Dispense Refill   acetaminophen  (TYLENOL ) 650 MG CR tablet Take 650 mg by mouth every 8 (eight) hours as needed for pain.     ASPIRIN  81 PO Take 81 mg by mouth daily.     atorvastatin  (LIPITOR) 40 MG tablet Take 1 tablet (40 mg total) by mouth daily. 30 tablet 0   citalopram  (CELEXA ) 20 MG tablet Take 1 tablet (20 mg total) by mouth daily. 30 tablet 0   empagliflozin  (JARDIANCE ) 10 MG TABS tablet Take 1 tablet (10 mg total) by mouth daily before breakfast. 30 tablet 0   furosemide  (LASIX ) 20 MG tablet  Take 1 tablet (20 mg total) by mouth daily. 30 tablet 0   gabapentin  (NEURONTIN ) 300 MG capsule Take 300 mg by mouth 2 (two) times daily.     lactulose  (CHRONULAC ) 10 GM/15ML solution Take 45 mLs (30 g total) by mouth 3 (three) times daily. 236 mL 0   loperamide  (IMODIUM  A-D) 2 MG tablet Take 2 mg by mouth as needed for diarrhea or loose stools.     losartan  (COZAAR ) 50 MG tablet Take 1 tablet (50 mg total) by mouth daily. 30 tablet 0   methocarbamol  (ROBAXIN ) 500 MG tablet Take 1 tablet (500 mg total) by mouth every 6 (six) hours as needed for muscle spasms. 120 tablet 0   Multiple Vitamin (MULTIVITAMIN WITH MINERALS) TABS tablet Take 1 tablet by mouth daily.     Multiple Vitamins-Minerals (EYE MULTIVITAMIN PO) Take 1 tablet by mouth daily.     nitroGLYCERIN  (NITROLINGUAL ) 0.4 MG/SPRAY spray Place 1 spray under the tongue every 5 (five) minutes x 3 doses as needed for chest pain.     ondansetron  (ZOFRAN ) 4 MG tablet Take 1 tablet (4 mg total) by mouth every 6 (six) hours as needed for nausea. 20 tablet 0   pantoprazole  (PROTONIX ) 40 MG tablet Take 1 tablet (40 mg total) by mouth daily. 30 tablet 0   phenol (CHLORASEPTIC) 1.4 % LIQD Use as directed 1 spray in the mouth or throat as needed for throat irritation / pain.     QUEtiapine  (SEROQUEL ) 50 MG tablet Take 1 tablet (50 mg total) by mouth at bedtime. 30 tablet 0   rifaximin  (XIFAXAN ) 550 MG TABS tablet Take 1 tablet (550 mg total) by mouth 2 (two) times daily. 60 tablet 0   senna (SENOKOT) 8.6 MG TABS tablet Take 1 tablet (8.6 mg total) by mouth 2 (two) times daily as needed for mild constipation. 60 tablet 0   spironolactone  (ALDACTONE ) 25 MG tablet Take 0.5 tablets (12.5 mg total) by mouth daily. 30 tablet 0   No current facility-administered medications for this visit.   Vitals:   08/27/23 1341  BP: (!) 164/60  Pulse: (!) 48  SpO2: 100%  Weight: 192 lb (87.1 kg)   Wt Readings from Last 3  Encounters:  08/27/23 192 lb (87.1 kg)   08/22/23 189 lb 9.5 oz (86 kg)  08/19/23 189 lb 9.5 oz (86 kg)   Lab Results  Component Value Date   CREATININE 1.40 (H) 08/22/2023   CREATININE 1.57 (H) 08/19/2023   CREATININE 1.14 08/03/2023     PHYSICAL EXAM:  General:  Well appearing. No resp difficulty HEENT: normal Neck: supple. JVP flat. No lymphadenopathy. Cor: PMI normal. Regular rhythm. Bradycardic. No rubs, gallops or murmurs. Lungs: clear Abdomen: soft, nontender, nondistended. No hepatosplenomegaly. No bruits or masses.  Extremities: no cyanosis, clubbing, rash, trace pitting edema bilateral lower legs Neuro: alert & oriented x3, cranial nerves grossly intact. Moves all 4 extremities w/o difficulty. Affect pleasant.   ECG: not done   ASSESSMENT & PLAN:  1: NICM with preserved ejection fraction- - likely due to HTN - NYHA class II - euvolemic - being weighed daily at the group home; reminded to call for overnight weight gain of > 3 pounds or weekly weight gain of > 5 pounds - weight down 7 pounds from last visit here 6 weeks ago  - Echo 03/20/18: EF of 60-65% along with mild/moderate Drew and a mildly elevated PA pressure of 42 mm Hg - Echo 04/01/21: EF of 55-60% along with mild LVH/ LAE. - Echo 03/25/22: EF 60-65% - Echo 12/24/22: EF 60-65% along with mild LVH, mild LAE.   - continue jardiance  10mg  daily - continue furosemide  20mg  daily - continue losartan  50mg  daily - increase spironolactone  to 25mg  daily - BMET in 2 weeks - had telemedicine visit with cardiology Florestine) 02/22 - BNP 08/22/23 was 252.4  2: HTN- - BP 164/60 - increasing spironolactone  per above - sees PCP at [facility])  - BMP 08/22/23 reviewed and showed sodium 139, potassium 3.6, creatinine 1.4 and GFR 53 - BMET next visit  3: DM- - A1c 06/27/23 was 5.0%  4: Liver cirrhosis- - likely secondary hepatitis C w/ positive viral load in 2019 - acute viral panel 02/24 demonstrated positive for hepatitis B antigen.   - needs to get established with GI  5: Cervical myelopathy- - status post C3-6 ACDF 11/24 - saw neurosurgery Jasper) 12/24   Return in 2 weeks, sooner if needed.

## 2023-08-27 NOTE — Patient Instructions (Addendum)
 INCREASE SPIRONOLACTONE TO 25 MG ONCE DAILY

## 2023-08-30 ENCOUNTER — Telehealth: Payer: Self-pay | Admitting: Neurosurgery

## 2023-08-30 NOTE — Telephone Encounter (Signed)
 I called the group home where [name] is staying to find out more about how he is doing to see if we need to move his appointment up so he can be seen sooner. They deny that he fell stating that they had another resident fall but not him. I attempted to call Kate with [facility], but her phone went to voicemail. I left our office number to call back so we can talk with her and discuss more for her concerns. I will follow up with Jill when she returns my call and see if I can talk with someone higher up at the group home.

## 2023-08-30 NOTE — Telephone Encounter (Signed)
 C3-6 ACDF on 07/01/23 Kate she is a PTA with [facility] She is calling to report that yesterday at his visit he reported his pain to be 9 out of 10. He reported a fall to her but the group home said they were not aware of a recent fall. He did seem more confused than other times. His heart rate was 58 also. Please call the group home at [phone]. You can also call Jill if you have anymore questions.

## 2023-09-02 NOTE — Telephone Encounter (Signed)
 I called Kate from [facility] to follow up on a call from Friday 08/30/23. She reports that the patient told her that he had fallen up to three times recently. Also he states that his neck and low back are hurting more than usual. In a previous conversation with Patty, she stated that he seemed more confused and complaining of 9/10 pain.

## 2023-09-02 NOTE — Telephone Encounter (Addendum)
 I called [name] Later, one of the managers at the group home Mr. Blowe resides. He says they are aware that he has had increased falls with increased pain in his neck and low back as well as the home health agencies concerns of confusion and low heart rate. He states that the patient was seen at [facility] in the ED following his falls.He states that he saw his cardiologist recently also. He states the patient is doing a lot better and appears to be back at baseline. I offered that we could follow up with him sooner due to these concerns. He indicated understanding of this and will continue to check in with the patient.

## 2023-09-06 NOTE — Progress Notes (Deleted)
 PCP: Carlin Bard Bernerd Bernardino (last seen ~ 2 weeks ago) Primary Cardiologist: Perla Lye, MD (last seen 02/22)  Chief Complaint: shortness of breath   HPI:  [name] is a 73 y/o male with a history of DM, HTN, COPD, migraines, hepatitis C, w/ likely underlying HCC, cirrhosis, TIA, depression, anxiety obstructive sleep apnea, cervical stenosis status post C3-6 ACDF and chronic heart failure.  Admitted 06/17/23 due to dysuria, was found to have E. coli bacteremia. Having some falls, patient having abdominal pain and weight loss. Abd/ pelvic CT stable. MRI of cervical spine shows C4-5 severe spinal stenosis with cord compression. Neurosurgery consulted. Antibiotics provided. Admitted 07/01/23 due to cervical myelopathy and cervical stenosis status post C3-6 ACDF. He was admitted overnight for pain control, therapy evaluation, and drain output monitoring. His postoperative course was complicated by worsening thrombocytopenia which improved back to his baseline with 1 unit of platelets. His drain output reduced to an acceptable level and was removed on the afternoon of postop day 1 and discharged the following day.   Admitted 07/29/23 due to AMS and a fall. Had not been taking his lactulose  due to diarrhea & generalized weakness. CXR normal. Head CT normal. Palliative care consulted due to multiple comorbidities. PT consulted. Mental status improved and discharged to group home. 2 December ED visits due to weakness/ tremors.   Echo 03/20/18: EF of 60-65% along with mild/moderate Drew and a mildly elevated PA pressure of 42 mm Hg Echo 04/01/21: EF of 55-60% along with mild LVH/ LAE. Echo 03/25/22: EF 60-65% Echo 12/24/22: EF 60-65% along with mild LVH, mild LAE.    He presents today for a HF follow-up visit with a chief complaint of   At last visit, spironolactone  was increased to 25mg  daily.   ROS: All systems negative except as listed in HPI, PMH and Problem List.  SH:  Social History    Socioeconomic History   Marital status: Single    Spouse name: Not on file   Number of children: 2   Years of education: 12   Highest education level: 12th grade  Occupational History   Occupation: retired  Tobacco Use   Smoking status: Some Days    Types: Cigarettes   Smokeless tobacco: Never   Tobacco comments:    1-3 cigarettes per month  Vaping Use   Vaping status: Not on file  Substance and Sexual Activity   Alcohol use: Never   Drug use: Never   Sexual activity: Not Currently    Birth control/protection: None  Other Topics Concern   Not on file  Social History Narrative   Lives in [facility]   Social Drivers of Health   Financial Resource Strain: High Risk (04/27/2022)   Overall Financial Resource Strain (CARDIA)    Difficulty of Paying Living Expenses: Very hard  Food Insecurity: Food Insecurity Present (07/30/2023)   Hunger Vital Sign    Worried About Running Out of Food in the Last Year: Sometimes true    Ran Out of Food in the Last Year: Sometimes true  Transportation Needs: No Transportation Needs (07/30/2023)   PRAPARE - Administrator, Civil Service (Medical): No    Lack of Transportation (Non-Medical): No  Recent Concern: Transportation Needs - Unmet Transportation Needs (07/01/2023)   PRAPARE - Transportation    Lack of Transportation (Medical): Yes    Lack of Transportation (Non-Medical): Yes  Physical Activity: Sufficiently Active (04/16/2018)   Exercise Vital Sign    Days of Exercise  per Week: 7 days    Minutes of Exercise per Session: 30 min  Stress: Stress Concern Present (04/16/2018)   [employer] of Occupational Health - Occupational Stress Questionnaire    Feeling of Stress : Very much  Social Connections: Moderately Isolated (04/30/2018)   Social Connection and Isolation Panel [NHANES]    Frequency of Communication with Friends and Family: Twice a week    Frequency of Social Gatherings with Friends and Family: Never     Attends Religious Services: More than 4 times per year    Active Member of [employer] or Organizations: No    Attends Banker Meetings: Never    Marital Status: Widowed  Intimate Partner Violence: Not At Risk (07/30/2023)   [name], Afraid, [name], and Kick questionnaire    Fear of Current or Ex-Partner: No    Emotionally Abused: No    Physically Abused: No    Sexually Abused: No    FH:  Family History  Problem Relation Age of Onset   Hypertension Mother    Heart disease Mother    Heart failure Maternal Grandmother     Past Medical History:  Diagnosis Date   (HFpEF) heart failure with preserved ejection fraction (HCC)    a.) TTE 03/20/2018: EF 60-65%, no RWMAs, mild LAE, mild-mod Drew, PASP 42; b.) TTE 03/31/2021: EF 55-60%, no RWMAs, mild LVH, mild LAE, AoV sclerosis without stenosis; c.) TTE 03/25/2022: EF 60-65%, no RWMAs, AoV sclerosis without stenosis; d.) TTE 12/24/2022: EF 60-65%, no RWMAs, mild LVH, mild LAE.   Anasarca    Anemia    Anxiety    Aortic atherosclerosis (HCC)    Arthritis    Asthma    Bacteremia due to Escherichia coli 05/2023   Bacteremia due to Klebsiella pneumoniae 2023   Bilateral carotid artery disease (HCC)    Bradycardia    CAD (coronary artery disease) 10/21/2006   a.) MV 10/21/2006: small reversible defect involving the apical  lateral wall c/w possible ischemia; b.) MV: 03/24/2018: no ischemia; c.) MV 10/04/2021: LAD calcifications   Cervical radiculopathy    Chest pain    a.) MV 03/21/2018: no ischemia; b.) MV 10/04/2021: no ischemia   Chronic back pain    Chronic common bile duct dilatation    Cirrhosis (HCC)    a.) (+) hepatic lesion on CT; likely hepatocellular carcinoma; b.) CT evidence of associated portal hypertension; c.) (+) abdominopelvic anasarca   COPD (chronic obstructive pulmonary disease) (HCC)    DDD (degenerative disc disease), lumbar    Depression    Frequent falls    GERD (gastroesophageal reflux disease)    HBV  (hepatitis B virus) infection    HCV (hepatitis C virus)    Hernia of anterior abdominal wall    History of 2019 novel coronavirus disease (COVID-19) 04/16/2020   Homelessness    a.) limited on placement as patient is listed on [place] sex offender listing for exploitation of a minor (possession of child pornography); incarcerated 12 years (released 11/2017)   Hypertension    Infestation by bed bug 02/2022   Malingering    Migraine    Nephrolithiasis    Obesity    Portal hypertension (HCC)    PTSD (post-traumatic stress disorder)    a.) brief training in [employer] at age 54; someone was killed in front of him   Pulmonary HTN (HCC)    a.) multiple CT scans desmontrating dilated PA c/w pHTN; b.) TTE 03/20/2018: PASP 42 mmHg   Sleep apnea  a.) no nocturnal PAP therapy   Stenosis of cervical spine with myelopathy (HCC)    Suicidal ideations    T2DM (type 2 diabetes mellitus) (HCC)    Thrombocytopenia (HCC)    a.) related to hepatic cirrhosis; spleen normal in size   TIA (transient ischemic attack)     Current Outpatient Medications  Medication Sig Dispense Refill   acetaminophen  (TYLENOL ) 650 MG CR tablet Take 650 mg by mouth every 8 (eight) hours as needed for pain.     ASPIRIN  81 PO Take 81 mg by mouth daily.     atorvastatin  (LIPITOR) 40 MG tablet Take 1 tablet (40 mg total) by mouth daily. 30 tablet 0   citalopram  (CELEXA ) 20 MG tablet Take 1 tablet (20 mg total) by mouth daily. 30 tablet 0   empagliflozin  (JARDIANCE ) 10 MG TABS tablet Take 1 tablet (10 mg total) by mouth daily before breakfast. 30 tablet 0   furosemide  (LASIX ) 20 MG tablet Take 1 tablet (20 mg total) by mouth daily. 30 tablet 0   gabapentin  (NEURONTIN ) 300 MG capsule Take 300 mg by mouth 2 (two) times daily.     lactulose  (CHRONULAC ) 10 GM/15ML solution Take 45 mLs (30 g total) by mouth 3 (three) times daily. 236 mL 0   loperamide  (IMODIUM  A-D) 2 MG tablet Take 2 mg by mouth as needed for diarrhea or loose stools.  (Patient not taking: Reported on 08/27/2023)     losartan  (COZAAR ) 50 MG tablet Take 1 tablet (50 mg total) by mouth daily. 30 tablet 0   methocarbamol  (ROBAXIN ) 500 MG tablet Take 1 tablet (500 mg total) by mouth every 6 (six) hours as needed for muscle spasms. 120 tablet 0   Multiple Vitamin (MULTIVITAMIN WITH MINERALS) TABS tablet Take 1 tablet by mouth daily.     Multiple Vitamins-Minerals (EYE MULTIVITAMIN PO) Take 1 tablet by mouth daily.     nitroGLYCERIN  (NITROLINGUAL ) 0.4 MG/SPRAY spray Place 1 spray under the tongue every 5 (five) minutes x 3 doses as needed for chest pain.     ondansetron  (ZOFRAN ) 4 MG tablet Take 1 tablet (4 mg total) by mouth every 6 (six) hours as needed for nausea. 20 tablet 0   pantoprazole  (PROTONIX ) 40 MG tablet Take 1 tablet (40 mg total) by mouth daily. 30 tablet 0   phenol (CHLORASEPTIC) 1.4 % LIQD Use as directed 1 spray in the mouth or throat as needed for throat irritation / pain.     QUEtiapine  (SEROQUEL ) 50 MG tablet Take 1 tablet (50 mg total) by mouth at bedtime. 30 tablet 0   rifaximin  (XIFAXAN ) 550 MG TABS tablet Take 1 tablet (550 mg total) by mouth 2 (two) times daily. 60 tablet 0   senna (SENOKOT) 8.6 MG TABS tablet Take 1 tablet (8.6 mg total) by mouth 2 (two) times daily as needed for mild constipation. 60 tablet 0   spironolactone  (ALDACTONE ) 25 MG tablet Take 1 tablet (25 mg total) by mouth daily. 90 tablet 3   No current facility-administered medications for this visit.       PHYSICAL EXAM:  General:  Well appearing. No resp difficulty HEENT: normal Neck: supple. JVP flat. No lymphadenopathy. Cor: PMI normal. Regular rhythm. Bradycardic. No rubs, gallops or murmurs. Lungs: clear Abdomen: soft, nontender, nondistended. No hepatosplenomegaly. No bruits or masses.  Extremities: no cyanosis, clubbing, rash, trace pitting edema bilateral lower legs Neuro: alert & oriented x3, cranial nerves grossly intact. Moves all 4 extremities w/o  difficulty. Affect pleasant.  ECG: not done   ASSESSMENT & PLAN:  1: NICM with preserved ejection fraction- - likely due to HTN - NYHA class II - euvolemic - being weighed daily at the group home; reminded to call for overnight weight gain of > 3 pounds or weekly weight gain of > 5 pounds - weight 192 pounds from last visit here 6 weeks ago  - Echo 03/20/18: EF of 60-65% along with mild/moderate Drew and a mildly elevated PA pressure of 42 mm Hg - Echo 04/01/21: EF of 55-60% along with mild LVH/ LAE. - Echo 03/25/22: EF 60-65% - Echo 12/24/22: EF 60-65% along with mild LVH, mild LAE.   - continue jardiance  10mg  daily - continue furosemide  20mg  daily - continue losartan  50mg  daily - increase spironolactone  to 25mg  daily - BMET today - had telemedicine visit with cardiology Florestine) 02/22 - BNP 08/22/23 was 252.4  2: HTN- - BP  - sees PCP at [facility] Mathias)  - BMP 08/22/23 reviewed and showed sodium 139, potassium 3.6, creatinine 1.4 and GFR 53 - BMET today  3: DM- - A1c 06/27/23 was 5.0%  4: Liver cirrhosis- - likely secondary hepatitis C w/ positive viral load in 2019 - acute viral panel 02/24 demonstrated positive for hepatitis B antigen.  - needs to get established with GI  5: Cervical myelopathy- - status post C3-6 ACDF 11/24 - saw neurosurgery Jasper) 12/24

## 2023-09-10 ENCOUNTER — Telehealth: Payer: Self-pay | Admitting: Family

## 2023-09-10 ENCOUNTER — Encounter: Payer: 59 | Admitting: Family

## 2023-09-10 NOTE — Telephone Encounter (Signed)
 Patient did not show for his [facility] appointment on  09/10/23.

## 2023-09-19 ENCOUNTER — Encounter: Payer: 59 | Admitting: Neurosurgery

## 2023-09-19 ENCOUNTER — Ambulatory Visit: Payer: 59 | Admitting: Gastroenterology

## 2023-09-19 NOTE — Progress Notes (Deleted)
Wyline Mood MD, MRCP(U.K) [address]  [address]  [place], [place] [address]  Main: [phone]  Fax: [phone]   Gastroenterology Consultation  Referring Provider:     [name], MD Primary Care Physician:  [name], MD Primary Gastroenterologist:  Dr. Wyline Mood  Reason for Consultation:    Hepatitis         HPI:   [name] is a 73 y.o. y/o male referred for consultation & management  by Dr. [name], MD.     He was last seen back in 2019 and didn't follow up after.   H/o  hepatitis C genotype 1a ,hepatitis B core antibody positive suggesting of prior infection.  05/27/2018: F3 F4 fibrosis. Markedly dilated common bile duct up to 18 mm   06/19/2023: CT abdomen and pelvis : cirrhosis of liver, stable hernia and common bile duct dilation.   08/31/2022: MR abdomen : motion degradation ? Rt lobe lesion LI RADS 4 lesion , features of portal hypertension   08/22/2023: BMP: Cr 1.4 , hb 13.3 ,plt 67 , alb 2.0    Past Medical History:  Diagnosis Date   (HFpEF) heart failure with preserved ejection fraction (HCC)    a.) TTE 03/20/2018: EF 60-65%, no RWMAs, mild LAE, mild-mod MR, PASP 42; b.) TTE 03/31/2021: EF 55-60%, no RWMAs, mild LVH, mild LAE, AoV sclerosis without stenosis; c.) TTE 03/25/2022: EF 60-65%, no RWMAs, AoV sclerosis without stenosis; d.) TTE 12/24/2022: EF 60-65%, no RWMAs, mild LVH, mild LAE.   Anasarca    Anemia    Anxiety    Aortic atherosclerosis (HCC)    Arthritis    Asthma    Bacteremia due to Escherichia coli 05/2023   Bacteremia due to Klebsiella pneumoniae 2023   Bilateral carotid artery disease (HCC)    Bradycardia    CAD (coronary artery disease) 10/21/2006   a.) MV 10/21/2006: small reversible defect involving the apical  lateral wall c/w possible ischemia; b.) MV: 03/24/2018: no ischemia; c.) MV 10/04/2021: LAD calcifications   Cervical radiculopathy    Chest pain    a.) MV 03/21/2018: no ischemia; b.) MV 10/04/2021: no  ischemia   Chronic back pain    Chronic common bile duct dilatation    Cirrhosis (HCC)    a.) (+) hepatic lesion on CT; likely hepatocellular carcinoma; b.) CT evidence of associated portal hypertension; c.) (+) abdominopelvic anasarca   COPD (chronic obstructive pulmonary disease) (HCC)    DDD (degenerative disc disease), lumbar    Depression    Frequent falls    GERD (gastroesophageal reflux disease)    HBV (hepatitis B virus) infection    HCV (hepatitis C virus)    Hernia of anterior abdominal wall    History of 2019 novel coronavirus disease (COVID-19) 04/16/2020   Homelessness    a.) limited on placement as patient is listed on [place] sex offender listing for exploitation of a minor (possession of child pornography); incarcerated 12 years (released 11/2017)   Hypertension    Infestation by bed bug 02/2022   Malingering    Migraine    Nephrolithiasis    Obesity    Portal hypertension (HCC)    PTSD (post-traumatic stress disorder)    a.) brief training in the [employer] at age 55; someone was killed in front of him   Pulmonary HTN (HCC)    a.) multiple CT scans desmontrating dilated PA c/w pHTN; b.) TTE 03/20/2018: PASP 42 mmHg   Sleep apnea  a.) no nocturnal PAP therapy   Stenosis of cervical spine with myelopathy (HCC)    Suicidal ideations    T2DM (type 2 diabetes mellitus) (HCC)    Thrombocytopenia (HCC)    a.) related to hepatic cirrhosis; spleen normal in size   TIA (transient ischemic attack)     Past Surgical History:  Procedure Laterality Date   ANTERIOR CERVICAL DECOMP/DISCECTOMY FUSION N/A 07/01/2023   Procedure: ANTERIOR CERVICAL DISCECTOMY AND FUSION (FORGE);  Surgeon: Venetia Night, MD;  Location: [facility] ORS;  Service: Neurosurgery;  Laterality: N/A;  Keep as last case of the day per Dr Myer Haff   APPENDECTOMY     CARDIAC CATHETERIZATION     CHOLECYSTECTOMY     EYE SURGERY     INNER EAR SURGERY     NOSE SURGERY     XI ROBOTIC LAPAROSCOPIC ASSISTED  APPENDECTOMY N/A 11/05/2022   Procedure: XI ROBOTIC LAPAROSCOPIC ASSISTED APPENDECTOMY;  Surgeon: Carolan Shiver, MD;  Location: [facility] ORS;  Service: General;  Laterality: N/A;    Prior to Admission medications   Medication Sig Start Date End Date Taking? Authorizing Provider  acetaminophen (TYLENOL) 650 MG CR tablet Take 650 mg by mouth every 8 (eight) hours as needed for pain.    [provider]  ASPIRIN 81 PO Take 81 mg by mouth daily.    [provider]  atorvastatin (LIPITOR) 40 MG tablet Take 1 tablet (40 mg total) by mouth daily. 06/21/23 06/20/24  Alford Highland, MD  citalopram (CELEXA) 20 MG tablet Take 1 tablet (20 mg total) by mouth daily. 06/21/23 08/27/23  Alford Highland, MD  empagliflozin (JARDIANCE) 10 MG TABS tablet Take 1 tablet (10 mg total) by mouth daily before breakfast. 06/21/23   Alford Highland, MD  furosemide (LASIX) 20 MG tablet Take 1 tablet (20 mg total) by mouth daily. 06/21/23   Alford Highland, MD  gabapentin (NEURONTIN) 300 MG capsule Take 300 mg by mouth 2 (two) times daily. 06/07/23   [provider]  lactulose (CHRONULAC) 10 GM/15ML solution Take 45 mLs (30 g total) by mouth 3 (three) times daily. 08/05/23   Lanae Boast, MD  loperamide (IMODIUM A-D) 2 MG tablet Take 2 mg by mouth as needed for diarrhea or loose stools. Patient not taking: Reported on 08/27/2023    [provider]  losartan (COZAAR) 50 MG tablet Take 1 tablet (50 mg total) by mouth daily. 06/21/23   Alford Highland, MD  methocarbamol (ROBAXIN) 500 MG tablet Take 1 tablet (500 mg total) by mouth every 6 (six) hours as needed for muscle spasms. 07/02/23   Susanne Borders, PA  Multiple Vitamin (MULTIVITAMIN WITH MINERALS) TABS tablet Take 1 tablet by mouth daily.    [provider]  Multiple Vitamins-Minerals (EYE MULTIVITAMIN PO) Take 1 tablet by mouth daily.    [provider]  nitroGLYCERIN (NITROLINGUAL) 0.4 MG/SPRAY spray Place  1 spray under the tongue every 5 (five) minutes x 3 doses as needed for chest pain.    [provider]  ondansetron (ZOFRAN) 4 MG tablet Take 1 tablet (4 mg total) by mouth every 6 (six) hours as needed for nausea. 06/21/23   Alford Highland, MD  pantoprazole (PROTONIX) 40 MG tablet Take 1 tablet (40 mg total) by mouth daily. 06/21/23 08/27/23  Alford Highland, MD  phenol (CHLORASEPTIC) 1.4 % LIQD Use as directed 1 spray in the mouth or throat as needed for throat irritation / pain.    [provider]  QUEtiapine (SEROQUEL) 50 MG tablet  Take 1 tablet (50 mg total) by mouth at bedtime. 06/21/23   Alford Highland, MD  rifaximin (XIFAXAN) 550 MG TABS tablet Take 1 tablet (550 mg total) by mouth 2 (two) times daily. 06/21/23   Alford Highland, MD  senna (SENOKOT) 8.6 MG TABS tablet Take 1 tablet (8.6 mg total) by mouth 2 (two) times daily as needed for mild constipation. 07/02/23   Susanne Borders, PA  spironolactone (ALDACTONE) 25 MG tablet Take 1 tablet (25 mg total) by mouth daily. 08/27/23 11/25/23  Delma Freeze, FNP    Family History  Problem Relation Age of Onset   Hypertension Mother    Heart disease Mother    Heart failure Maternal Grandmother      Social History   Tobacco Use   Smoking status: Some Days    Types: Cigarettes   Smokeless tobacco: Never   Tobacco comments:    1-3 cigarettes per month  Substance Use Topics   Alcohol use: Never   Drug use: Never    Allergies as of 09/19/2023 - Review Complete 08/27/2023  Allergen Reaction Noted   Bee venom Anaphylaxis 07/01/2022   Codeine Itching 04/28/2022   Novocain [procaine]  03/28/2018   Sulfa antibiotics Other (See Comments) 12/16/2017    Review of Systems:    All systems reviewed and negative except where noted in HPI.   Physical Exam:  There were no vitals taken for this visit. No LMP for male patient. Psych:  Alert and cooperative. Normal mood and affect. General:   Alert,   Well-developed, well-nourished, pleasant and cooperative in NAD Head:  Normocephalic and atraumatic. Eyes:  Sclera clear, no icterus.   Conjunctiva pink. Ears:  Normal auditory acuity. Neck:  Supple; no masses or thyromegaly. Lungs:  Respirations even and unlabored.  Clear throughout to auscultation.   No wheezes, crackles, or rhonchi. No acute distress. Heart:  Regular rate and rhythm; no murmurs, clicks, rubs, or gallops. Abdomen:  Normal bowel sounds.  No bruits.  Soft, non-tender and non-distended without masses, hepatosplenomegaly or hernias noted.  No guarding or rebound tenderness.    Neurologic:  Alert and oriented x3;  grossly normal neurologically. Psych:  Alert and cooperative. Normal mood and affect.  Imaging Studies: No results found.  Assessment and Plan:   FERD MUSA is a 73 y.o. y/o male has been referred for ***   Plan  MRI abdomen to evaluate liver mass seen in 08/2022 MRI No NSAID's, low salt diet , avoid narcotics.   Follow up in ***  Dr Wyline Mood MD,MRCP(U.K)    BP check ***

## 2023-09-30 ENCOUNTER — Emergency Department: Payer: 59

## 2023-09-30 ENCOUNTER — Observation Stay
Admission: EM | Admit: 2023-09-30 | Discharge: 2023-10-01 | Disposition: A | Discharge status: Skilled Nursing Facility | Payer: 59 | Attending: Internal Medicine | Admitting: Internal Medicine

## 2023-09-30 ENCOUNTER — Encounter: Payer: Self-pay | Admitting: Internal Medicine

## 2023-09-30 ENCOUNTER — Inpatient Hospital Stay: Payer: 59

## 2023-09-30 ENCOUNTER — Other Ambulatory Visit: Payer: Self-pay

## 2023-09-30 DIAGNOSIS — E1122 Type 2 diabetes mellitus with diabetic chronic kidney disease: Secondary | ICD-10-CM | POA: Diagnosis not present

## 2023-09-30 DIAGNOSIS — Z1152 Encounter for screening for COVID-19: Secondary | ICD-10-CM | POA: Diagnosis not present

## 2023-09-30 DIAGNOSIS — K7460 Unspecified cirrhosis of liver: Secondary | ICD-10-CM | POA: Diagnosis not present

## 2023-09-30 DIAGNOSIS — I129 Hypertensive chronic kidney disease with stage 1 through stage 4 chronic kidney disease, or unspecified chronic kidney disease: Secondary | ICD-10-CM | POA: Insufficient documentation

## 2023-09-30 DIAGNOSIS — I10 Essential (primary) hypertension: Secondary | ICD-10-CM

## 2023-09-30 DIAGNOSIS — I2510 Atherosclerotic heart disease of native coronary artery without angina pectoris: Secondary | ICD-10-CM

## 2023-09-30 DIAGNOSIS — N1831 Chronic kidney disease, stage 3a: Secondary | ICD-10-CM | POA: Diagnosis not present

## 2023-09-30 DIAGNOSIS — G459 Transient cerebral ischemic attack, unspecified: Secondary | ICD-10-CM | POA: Insufficient documentation

## 2023-09-30 DIAGNOSIS — K7682 Hepatic encephalopathy: Principal | ICD-10-CM

## 2023-09-30 DIAGNOSIS — F17210 Nicotine dependence, cigarettes, uncomplicated: Secondary | ICD-10-CM | POA: Insufficient documentation

## 2023-09-30 DIAGNOSIS — E119 Type 2 diabetes mellitus without complications: Secondary | ICD-10-CM

## 2023-09-30 DIAGNOSIS — B889 Infestation, unspecified: Secondary | ICD-10-CM | POA: Diagnosis not present

## 2023-09-30 DIAGNOSIS — E785 Hyperlipidemia, unspecified: Secondary | ICD-10-CM

## 2023-09-30 DIAGNOSIS — R4182 Altered mental status, unspecified: Principal | ICD-10-CM

## 2023-09-30 DIAGNOSIS — B888 Other specified infestations: Secondary | ICD-10-CM | POA: Insufficient documentation

## 2023-09-30 HISTORY — DX: Depression, unspecified: F32.A

## 2023-09-30 HISTORY — DX: Atherosclerotic heart disease of native coronary artery without angina pectoris: I25.10

## 2023-09-30 HISTORY — DX: Hyperlipidemia, unspecified: E78.5

## 2023-09-30 HISTORY — DX: Sleep apnea, unspecified: G47.30

## 2023-09-30 HISTORY — DX: Transient cerebral ischemic attack, unspecified: G45.9

## 2023-09-30 LAB — HEMOGLOBIN A1C
Hgb A1c MFr Bld: 4.9 % (ref 4.8–5.6)
Mean Plasma Glucose: 93.93 mg/dL

## 2023-09-30 LAB — URINE DRUG SCREEN, QUALITATIVE (ARMC ONLY)
Amphetamines, Ur Screen: NOT DETECTED
Barbiturates, Ur Screen: NOT DETECTED
Benzodiazepine, Ur Scrn: NOT DETECTED
Cannabinoid 50 Ng, Ur ~~LOC~~: NOT DETECTED
Cocaine Metabolite,Ur ~~LOC~~: NOT DETECTED
MDMA (Ecstasy)Ur Screen: NOT DETECTED
Methadone Scn, Ur: NOT DETECTED
Opiate, Ur Screen: NOT DETECTED
Phencyclidine (PCP) Ur S: NOT DETECTED
Tricyclic, Ur Screen: POSITIVE — AB

## 2023-09-30 LAB — CBC
HCT: 40 % (ref 39.0–52.0)
Hemoglobin: 13.6 g/dL (ref 13.0–17.0)
MCH: 31.4 pg (ref 26.0–34.0)
MCHC: 34 g/dL (ref 30.0–36.0)
MCV: 92.4 fL (ref 80.0–100.0)
Platelets: 65 10*3/uL — ABNORMAL LOW (ref 150–400)
RBC: 4.33 MIL/uL (ref 4.22–5.81)
RDW: 15.7 % — ABNORMAL HIGH (ref 11.5–15.5)
WBC: 2.1 10*3/uL — ABNORMAL LOW (ref 4.0–10.5)
nRBC: 0 % (ref 0.0–0.2)

## 2023-09-30 LAB — RESP PANEL BY RT-PCR (RSV, FLU A&B, COVID)  RVPGX2
Influenza A by PCR: NEGATIVE
Influenza B by PCR: NEGATIVE
Resp Syncytial Virus by PCR: NEGATIVE
SARS Coronavirus 2 by RT PCR: NEGATIVE

## 2023-09-30 LAB — CBG MONITORING, ED
Glucose-Capillary: 68 mg/dL — ABNORMAL LOW (ref 70–99)
Glucose-Capillary: 95 mg/dL (ref 70–99)
Glucose-Capillary: 125 mg/dL — ABNORMAL HIGH (ref 70–99)

## 2023-09-30 LAB — COMPREHENSIVE METABOLIC PANEL
ALT: 26 U/L (ref 0–44)
AST: 43 U/L — ABNORMAL HIGH (ref 15–41)
Albumin: 3 g/dL — ABNORMAL LOW (ref 3.5–5.0)
Alkaline Phosphatase: 81 U/L (ref 38–126)
Anion gap: 11 (ref 5–15)
BUN: 20 mg/dL (ref 8–23)
CO2: 22 mmol/L (ref 22–32)
Calcium: 10 mg/dL (ref 8.9–10.3)
Chloride: 108 mmol/L (ref 98–111)
Creatinine, Ser: 1.39 mg/dL — ABNORMAL HIGH (ref 0.61–1.24)
GFR, Estimated: 53 mL/min — ABNORMAL LOW (ref >60)
Glucose, Bld: 80 mg/dL (ref 70–99)
Potassium: 4.3 mmol/L (ref 3.5–5.1)
Sodium: 141 mmol/L (ref 135–145)
Total Bilirubin: 2.1 mg/dL — ABNORMAL HIGH (ref 0.0–1.2)
Total Protein: 7.2 g/dL (ref 6.5–8.1)

## 2023-09-30 LAB — URINALYSIS, W/ REFLEX TO CULTURE (INFECTION SUSPECTED)
Bacteria, UA: NONE SEEN
Bilirubin Urine: NEGATIVE
Glucose, UA: >=500 mg/dL — AB
Hgb urine dipstick: NEGATIVE
Ketones, ur: NEGATIVE mg/dL
Leukocytes,Ua: NEGATIVE
Nitrite: NEGATIVE
Protein, ur: NEGATIVE mg/dL
Specific Gravity, Urine: 1.015 (ref 1.005–1.030)
Squamous Epithelial / HPF: 0 /[HPF] (ref 0–5)
pH: 7 (ref 5.0–8.0)

## 2023-09-30 LAB — TROPONIN I (HIGH SENSITIVITY): Troponin I (High Sensitivity): 13 ng/L (ref <18)

## 2023-09-30 LAB — MAGNESIUM: Magnesium: 2.2 mg/dL (ref 1.7–2.4)

## 2023-09-30 LAB — TSH: TSH: 1.272 u[IU]/mL (ref 0.350–4.500)

## 2023-09-30 LAB — PROCALCITONIN: Procalcitonin: <0.1 ng/mL

## 2023-09-30 LAB — ETHANOL: Alcohol, Ethyl (B): <10 mg/dL (ref <10)

## 2023-09-30 LAB — AMMONIA: Ammonia: 96 umol/L — ABNORMAL HIGH (ref 9–35)

## 2023-09-30 MED ORDER — ACETAMINOPHEN 325 MG PO TABS
650.0000 mg | ORAL_TABLET | Freq: Four times a day (QID) | ORAL | Status: DC | PRN
Start: 2023-09-30 — End: 2023-10-05

## 2023-09-30 MED ORDER — LACTULOSE 10 GM/15ML PO SOLN
30.0000 g | Freq: Three times a day (TID) | ORAL | Status: DC
  Administered 2023-09-30 – 2023-10-01 (×3): 30 g via ORAL
  Filled 2023-09-30 (×2): qty 60

## 2023-09-30 MED ORDER — ONDANSETRON HCL 4 MG PO TABS: 4.0000 mg | ORAL_TABLET | Freq: Four times a day (QID) | ORAL | Status: DC | PRN

## 2023-09-30 MED ORDER — ATORVASTATIN CALCIUM 20 MG PO TABS
40.0000 mg | ORAL_TABLET | Freq: Every day | ORAL | Status: DC
  Administered 2023-09-30: 40 mg via ORAL
  Filled 2023-09-30: qty 2

## 2023-09-30 MED ORDER — ONDANSETRON HCL 4 MG/2ML IJ SOLN: 4.0000 mg | Freq: Four times a day (QID) | INTRAMUSCULAR | Status: DC | PRN

## 2023-09-30 MED ORDER — RIFAXIMIN 550 MG PO TABS
550.0000 mg | ORAL_TABLET | Freq: Two times a day (BID) | ORAL | Status: DC
  Administered 2023-09-30 – 2023-10-01 (×3): 550 mg via ORAL
  Filled 2023-09-30 (×3): qty 1

## 2023-09-30 MED ORDER — ACETAMINOPHEN 325 MG RE SUPP: 650.0000 mg | Freq: Four times a day (QID) | RECTAL | Status: DC | PRN

## 2023-09-30 MED ORDER — LACTULOSE 10 GM/15ML PO SOLN
30.0000 g | Freq: Once | ORAL | Status: DC
  Filled 2023-09-30: qty 60

## 2023-09-30 MED ORDER — SPIRONOLACTONE 25 MG PO TABS
25.0000 mg | ORAL_TABLET | Freq: Every day | ORAL | Status: DC
  Administered 2023-09-30 – 2023-10-01 (×2): 25 mg via ORAL
  Filled 2023-09-30 (×2): qty 1

## 2023-09-30 MED ORDER — HYDRALAZINE HCL 20 MG/ML IJ SOLN: 5.0000 mg | Freq: Four times a day (QID) | INTRAMUSCULAR | Status: DC | PRN

## 2023-09-30 MED ORDER — INSULIN ASPART 100 UNIT/ML IJ SOLN: 0.0000 [IU] | Freq: Three times a day (TID) | INTRAMUSCULAR | Status: DC

## 2023-09-30 MED ORDER — DEXTROSE 50 % IV SOLN: 1.0000 | INTRAVENOUS | Status: DC | PRN

## 2023-09-30 MED ORDER — INSULIN ASPART 100 UNIT/ML IJ SOLN: 0.0000 [IU] | Freq: Every day | INTRAMUSCULAR | Status: DC

## 2023-09-30 NOTE — H&P (Addendum)
History and Physical   [name] RUE:[id number] DOB: 28-May-1951 DOA: 09/30/2023  PCP: [facility]  Outpatient Specialists: Dr. Marcell Barlow, neurosurgery Patient coming from: Group home via EMS  I have personally briefly reviewed patient's old medical records in [facility] EMR.  Chief Concern: Confusion, altered mental status  HPI: 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 coli bacteremia in October 2024, history of Klebsiella pneumonia bacteremia, chronic back pain status post discectomy, who presents to the emergency department from group home facility for chief concerns of altered mental status.  Per ED documentation, facility staff was not able to arouse patient this morning.  Vitals in the ED showed temperature of 98.1, respiration rate 14, heart rate 44, blood pressure 154/69, SpO2 100% room air.  Serum sodium is 141, potassium 4.3, chloride 108, bicarb 22, BUN of 20, serum creatinine 1.39, EGFR 53, nonfasting blood glucose 80, WBC 2.1, hemoglobin 13.6, platelets of 65.  AST was elevated at 43.  ALT was not elevated.  Ammonia was elevated at 96.  High sensitive troponin was 13.  UDS and UA has been ordered and pending collection at this time.  Magnesium level was within normal limits at 2.1.  Ethanol level was within normal limits, less than 10.  COVID/influenza A/influenza B/RSV PCR were negative.  CT head without contrast per EDP: Was read as no acute intracranial abnormality.  ED treatment: Lactulose 30 g p.o. one-time dose. ----------------------------------------- At bedside, patient opens eyes spontaneously.  Patient is able to follow commands.  Patient is able to tell me his first, last name, and his current location of hospital.  He is not able to tell me the  current calendar, the current month, or his age.  He states he is 4, and then 73, and then 73 years old.  He states the years 1993, in the 1994.  He denies chest pain, shortness of breath, cough.  He states he is confused and he does not know why he is in the hospital.  He states that people give him his medications to take.  Social history: Patient is from group home.  He denies tobacco, EtOH, recreational drug use.  ROS: Unable to complete due to patient acute altered mental status.  ED Course: Discussed with EDP, patient requiring hospitalization for chief concerns of altered mental status, suspect secondary to hepatic encephalopathy.  Assessment/Plan  Principal Problem:   Hepatic encephalopathy (HCC) Active Problems:   Liver cirrhosis (HCC)   Infestation by bed bug   Essential hypertension   Diabetes mellitus type 2, noninsulin dependent (HCC)   CAD (coronary artery disease)   TIA (transient ischemic attack)   Hyperlipidemia   Assessment and Plan:  * Hepatic encephalopathy (HCC) Etiology workup in progress UDS, UA has been ordered by EDP and is pending collection at the time of this dictation Check portable chest x-ray, procalcitonin Addendum (13:44): CXR, procalcitonin, UA negative for nitrite leukocytes were negative  Query medication compliance vs possible progression of undiagnosed dementia Agree with lactulose 30 g p.o. one-time dose Strict I's and O's Admit to telemetry medical, inpatient  Hyperlipidemia Patient has chronic AF elevation Atorvastatin 40 mg nightly resumed  CAD (coronary artery disease) Atorvastatin 40 mg nightly resumed on admission  Diabetes mellitus type 2, noninsulin dependent (HCC) Insulin SSI with at bedtime coverage ordered Goal inpatient blood glucose levels 140-180  D50, 1 amp, as needed for hypoglycemia; 5 days ordered  Essential hypertension Hydralazine 5 mg IV every 6 hours as needed for SBP greater than 170, 5 days  ordered  Liver cirrhosis (HCC) Home spironolactone 25 mg daily resumed on admission Home lactulose 30g 3 times daily, rifaximin 550 mg p.o. twice daily were resumed on admission  Pending med reconciliation and Epic chart merger.  Fall precaution, aspiration precaution  Chart reviewed.   DVT prophylaxis: TED hose; pharmacologic DVT prophylaxis not initiated on admission due to thrombocytopenia. Code Status: Full code Diet: N.p.o.; pending bedside swallow study Addendum (17:15): Per RN secure chat message: patient passed bedside swallow study with flying colors. Heart healthy diet ordered. Family Communication: no Disposition Plan: Pending clinical course Consults called: None at this time Admission status: Telemetry medical, inpatient  Past Medical History:  Diagnosis Date   CAD (coronary artery disease)    Depression    Hyperlipidemia    Sleep apnea    Past Surgical History:  Procedure Laterality Date   ANTERIOR CERVICAL DISCECTOMY     CARDIAC CATHETERIZATION     CHOLECYSTECTOMY     Social History:  reports that he has been smoking cigarettes. He has never used smokeless tobacco. He reports that he does not currently use alcohol. He reports that he does not currently use drugs.  Allergies  Allergen Reactions   Codeine    Novocain [Procaine]    Sulfa Antibiotics    Family History  Problem Relation Age of Onset   Hypertension Mother    Heart disease Mother    Heart failure Maternal Grandmother    Family history: Family history reviewed and not pertinent.  Prior to Admission medications   Xifaxan   Physical Exam: Vitals:   09/30/23 0848 09/30/23 0900 09/30/23 1200 09/30/23 1517  BP: (!) 159/70 (!) 154/69 (!) 152/64   Pulse:   (!) 44   Resp:   14   Temp:    97.9 F (36.6 C)  TempSrc:    Axillary  SpO2:   100%   Weight:      Height:       Constitutional: appears frail, chronically ill, NAD, calm Eyes: PERRL, lids and conjunctivae normal ENMT: Mucous  membranes are moist. Posterior pharynx clear of any exudate or lesions. Age-appropriate dentition. Hearing appropriate Neck: normal, supple, no masses, no thyromegaly Respiratory: clear to auscultation bilaterally, no wheezing, no crackles. Normal respiratory effort. No accessory muscle use.  Cardiovascular: Regular rate and rhythm, no murmurs / rubs / gallops. No extremity edema. 2+ pedal pulses. No carotid bruits.  Abdomen: no tenderness, no masses palpated, no hepatosplenomegaly. Bowel sounds positive.  Musculoskeletal: no clubbing / cyanosis. No joint deformity upper and lower extremities. Good ROM, no contractures, no atrophy. Normal muscle tone.  Skin: + jaundiced Neurologic: Sensation intact. Strength 5/5 in all 4.  Psychiatric: Normal judgment and insight. Alert and oriented x 3. Depressed mood. Flat affect  EKG: independently reviewed, showing junctional bradycardia with rate of 44, QTc 389  Chest x-ray on Admission: I personally reviewed and I agree with radiologist reading as below.  DG Chest Port 1 View Result Date: 09/30/2023 CLINICAL DATA:  Altered mental status EXAM: PORTABLE CHEST 1 VIEW COMPARISON:  Chest x-ray 07/29/2023 FINDINGS: The heart size and mediastinal contours are within normal limits. Both lungs are clear. Cervical spinal fusion plate is present. No acute fractures are seen. IMPRESSION: No active disease. Electronically Signed   By: Darliss Cheney M.D.   On: 09/30/2023 15:34  CT Head Wo Contrast Result Date: 09/30/2023 CLINICAL DATA:  Mental status change, unknown cause EXAM: CT HEAD WITHOUT CONTRAST TECHNIQUE: Contiguous axial images were obtained from the base of the skull through the vertex without intravenous contrast. RADIATION DOSE REDUCTION: This exam was performed according to the departmental dose-optimization program which includes automated exposure control, adjustment of the mA and/or kV according to patient size and/or use of iterative reconstruction  technique. COMPARISON:  None Available. FINDINGS: Brain: No evidence of large-territorial acute infarction. No parenchymal hemorrhage. No mass lesion. No extra-axial collection. No mass effect or midline shift. No hydrocephalus. Basilar cisterns are patent. Vascular: No hyperdense vessel. Skull: No acute fracture or focal lesion. Sinuses/Orbits: Paranasal sinuses and mastoid air cells are clear. The orbits are unremarkable. Other: None. IMPRESSION: No acute intracranial abnormality. Electronically Signed   By: Tish Frederickson M.D.   On: 09/30/2023 09:08   Labs on Admission: I have personally reviewed following labs  CBC: Recent Labs  Lab 09/30/23 0838  WBC 2.1*  HGB 13.6  HCT 40.0  MCV 92.4  PLT 65*   Basic Metabolic Panel: Recent Labs  Lab 09/30/23 0838  NA 141  K 4.3  CL 108  CO2 22  GLUCOSE 80  BUN 20  CREATININE 1.39*  CALCIUM 10.0  MG 2.2   GFR: Estimated Creatinine Clearance: 44.6 mL/min (A) (by C-G formula based on SCr of 1.39 mg/dL (H)).  Liver Function Tests: Recent Labs  Lab 09/30/23 0838  AST 43*  ALT 26  ALKPHOS 81  BILITOT 2.1*  PROT 7.2  ALBUMIN 3.0*   Recent Labs  Lab 09/30/23 1152  AMMONIA 96*   Thyroid Function Tests: Recent Labs    09/30/23 0838  TSH 1.272   This document was prepared using Dragon Voice Recognition software and may include unintentional dictation errors.  Dr. Sedalia Muta [facility]  If 7PM-7AM, please contact overnight-coverage provider If 7AM-7PM, please contact day attending provider [email or web address]  09/30/2023, 3:44 PM

## 2023-09-30 NOTE — ED Notes (Signed)
Pt attempting to get up and out of bed. Pt stating that he needs to have a BM. Pt states he does not walk. Pt had a BM in his brief before getting up out of bed. Pt brief changed. Peri-care preformed. Pt given warm blankets. Monitoring equipment reapplied. Socks placed on pt and bed alarm turn on.

## 2023-09-30 NOTE — Assessment & Plan Note (Signed)
On admission, continue Atorvastatin 40 mg nightly resumed on admission  10-01-2023 stable.

## 2023-09-30 NOTE — Assessment & Plan Note (Signed)
On admission, pt on Home spironolactone 25 mg daily resumed on admission. Home lactulose 30g 3 times daily, rifaximin 550 mg p.o. twice daily were resumed on admission.  10-01-2023 pt has another epic chart. MRN: [id number].  Hx of cirrhosis, presumably from untreated hep C. CT abd/pelvis in 05-2023 showed "Liver cirrhosis with portal hypertension". Continue with lactulose tid.  10-01-2023. Continue with aldactone and lasix at SNF. Add scheduled lactulose. Continue xifaxan. DC to group home.

## 2023-09-30 NOTE — ED Triage Notes (Signed)
Pt to ED via [facility] for being unresponsive. Per EMS pt lives in a care home and staff was unable to awake pt this am. When EMS arrived, staff reported pt to be at his baseline. Pt eye opening spontaneous and pt able to speak clearly. Pt oriented to self. Per the facility, that is baseline for pt. Pt has hx of cervical and spinal stenosis. Pt HR 44 at this time.  EMS vitals  HR 50 BP 191/78 SPO2 100% RA

## 2023-09-30 NOTE — ED Notes (Signed)
Provided snack box and OJ

## 2023-09-30 NOTE — ED Provider Notes (Signed)
[facility] Provider Note    Event Date/Time   First MD Initiated Contact with Patient 09/30/23 [phone]     (approximate)   History   Altered Mental Status   HPI  [name] is a 73 y.o. male past medical history significant for cirrhosis, pancytopenia, who presents to the emergency department with altered mental status.  History is provided by EMS.  States that patient is at a nursing care facility and presented to the emergency department with altered mental status.  Last known well was yesterday.  Stated that the patient is usually interactive, can talk and would be oriented.  Today they attempted to wake him up and were having trouble waking him up.  States that he has been confused and not acting his normal self.  No known recent falls or head trauma.  Patient opens his eyes and responding to some questions.  Following basic commands.  Unable to state his name or the date.  Does state that he has a history of spine issues.     Physical Exam   Triage Vital Signs: ED Triage Vitals  Encounter Vitals Group     BP --      Systolic BP Percentile --      Diastolic BP Percentile --      Pulse Rate 09/30/23 0828 (!) 44     Resp 09/30/23 0828 14     Temp --      Temp src --      SpO2 09/30/23 0828 100 %     Weight 09/30/23 0829 170 lb (77.1 kg)     Height 09/30/23 0829 5\' 5"  (1.651 m)     Head Circumference --      Peak Flow --      Pain Score 09/30/23 0829 0     Pain Loc --      Pain Education --      Exclude from Growth Chart --     Most recent vital signs: Vitals:   09/30/23 0900 09/30/23 1200  BP: (!) 154/69 (!) 152/64  Pulse:  (!) 44  Resp:  14  Temp:    SpO2:  100%    Physical Exam Constitutional:      Appearance: He is well-developed.  HENT:     Head: Atraumatic.     Mouth/Throat:     Mouth: Mucous membranes are moist.  Eyes:     Extraocular Movements: Extraocular movements intact.     Conjunctiva/sclera: Conjunctivae normal.      Pupils: Pupils are equal, round, and reactive to light.  Cardiovascular:     Rate and Rhythm: Regular rhythm. Bradycardia present.  Pulmonary:     Effort: No respiratory distress.  Abdominal:     Tenderness: There is no abdominal tenderness.  Musculoskeletal:     Cervical back: Normal range of motion.  Skin:    General: Skin is warm.  Neurological:     Mental Status: He is alert. He is disoriented.     Cranial Nerves: Cranial nerves 2-12 are intact.     Sensory: Sensation is intact.     Motor: Motor function is intact.     Comments: Able to raise bilateral upper and lower extremities to gravity.  Difficulty cooperating with finger-to-nose.  No obvious cranial nerve defects.  Psychiatric:        Mood and Affect: Mood normal.     IMPRESSION / MDM / ASSESSMENT AND PLAN / ED COURSE  I reviewed the triage  vital signs and the nursing notes.  Broad differential diagnosis including intracranial hemorrhage, infectious process, viral illness including COVID/influenza, urinary tract infection, dehydration, electrolyte abnormality, seizure and postictal, elevated ammonia level  Patient currently has a nonfocal neurologic exam do not feel that the patient needs to be an activated code stroke.  EKG  I, Corena Herter, the attending physician, personally viewed and interpreted this ECG.  Sinus bradycardia with a heart rate of 44.  Appears to have a short PR interval.  No signs of high degree heart block.  Normal QTc.  No significant ST elevation or depression.  Sinus bradycardia while on cardiac telemetry.  RADIOLOGY I independently reviewed imaging, my interpretation of imaging: CT head with no signs of intracranial hemorrhage or infarction.  Read as no acute findings.  LABS (all labs ordered are listed, but only abnormal results are displayed) Labs interpreted as -    Labs Reviewed  CBC - Abnormal; Notable for the following components:      Result Value   WBC 2.1 (*)    RDW 15.7  (*)    Platelets 65 (*)    All other components within normal limits  COMPREHENSIVE METABOLIC PANEL - Abnormal; Notable for the following components:   Creatinine, Ser 1.39 (*)    Albumin 3.0 (*)    AST 43 (*)    Total Bilirubin 2.1 (*)    GFR, Estimated 53 (*)    All other components within normal limits  AMMONIA - Abnormal; Notable for the following components:   Ammonia 96 (*)    All other components within normal limits  RESP PANEL BY RT-PCR (RSV, FLU A&B, COVID)  RVPGX2  ETHANOL  TSH  MAGNESIUM  URINE DRUG SCREEN, QUALITATIVE ([facility] ONLY)  URINALYSIS, W/ REFLEX TO CULTURE (INFECTION SUSPECTED)  TROPONIN I (HIGH SENSITIVITY)     MDM    Patient's chart marked for merge.  On chart review patient does have a history of bradycardia and appears to have sinus bradycardia in the past.  COVID and influenza testing are negative.  Patient does have a low white blood cell count and low platelets at 65.  Hemoglobin is stable.  On chart review does have a history of pancytopenia.  No significant electrolyte abnormality.  Troponin is negative have low suspicion for ACS.  Alcohol level is negative.  Mildly elevated T. bili.  No right upper quadrant abdominal tenderness to have a low suspicion for acute cholecystitis.  Thyroid studies within normal limits.  Ammonia level elevated at 95.  Ordered lactulose -concern for hepatic encephalopathy.  Abdomen is nontender to palpation no obvious signs of a GI bleed.  Have a low suspicion for SBP, no abdominal tenderness to palpation.  Will obtain an In-N-Out to further evaluate for UTI.  Consulted hospitalist for hepatic encephalopathy.   PROCEDURES:  Critical Care performed: No  Procedures  Patient's presentation is most consistent with acute presentation with potential threat to life or bodily function.   MEDICATIONS ORDERED IN ED: Medications  lactulose (CHRONULAC) 10 GM/15ML solution 30 g (has no administration in time range)    FINAL  CLINICAL IMPRESSION(S) / ED DIAGNOSES   Final diagnoses:  Altered mental status, unspecified altered mental status type  Hepatic encephalopathy (HCC)     Rx / DC Orders   ED Discharge Orders     None        Note:  This document was prepared using Dragon voice recognition software and may include unintentional dictation errors.   Lalia Loudon,  Carollee Herter, MD 09/30/23 1301

## 2023-09-30 NOTE — ED Notes (Signed)
Pt taken to bathroom by wc, cleaned of incontinence and new brief applied. All bedding and pads changed, warm blankets given.

## 2023-09-30 NOTE — Assessment & Plan Note (Addendum)
On admission, Etiology workup in progress. UDS, UA has been ordered by EDP and is pending collection at the time of this dictation. Check portable chest x-ray, procalcitonin. Addendum (13:44): CXR, procalcitonin, UA negative for nitrite leukocytes were negative. Query medication compliance vs possible progression of undiagnosed dementia. Agree with lactulose 30 g p.o. one-time dose. Strict I's and O's. Admit to telemetry medical, inpatient  10-01-2023 repeat NH3 today. Pt is aware he is in the hospital and states he has been living in a SNF for several months now. He seems to be pretty aware of his surrounding. Continue lactulose tid and xifaxan bid. UDS positive for TCA  *update. Repeat NH3 of 79. Pt is awake and alert now. Had low CBG this AM. Has eaten breakfast and lunch. CBG stable now. Will stop Jardiance. Continue with BID lactulose at SNF. Continue with Xifaxan. Stable for DC back to group home.

## 2023-09-30 NOTE — Assessment & Plan Note (Signed)
Patient has chronic AF elevation Atorvastatin 40 mg nightly resumed

## 2023-09-30 NOTE — Assessment & Plan Note (Signed)
On admission, continue Hydralazine 5 mg IV every 6 hours as needed for SBP greater than 170, 5 days ordered  10-01-2023 stable. Continue losartan at group home.

## 2023-09-30 NOTE — Hospital Course (Signed)
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 coli bacteremia in October 2024, history of Klebsiella pneumonia bacteremia, chronic back pain status post discectomy, who presents to the emergency department from group home facility for chief concerns of altered mental status.  Per ED documentation, facility staff was not able to arouse patient this morning.  Vitals in the ED showed temperature of 98.1, respiration rate 14, heart rate 44, blood pressure 154/69, SpO2 100% room air.  Serum sodium is 141, potassium 4.3, chloride 108, bicarb 22, BUN of 20, serum creatinine 1.39, EGFR 53, nonfasting blood glucose 80, WBC 2.1, hemoglobin 13.6, platelets of 65.  AST was elevated at 43.  ALT was not elevated.  Ammonia was elevated at 96.  High sensitive troponin was 13.  UDS and UA has been ordered and pending collection at this time.  Magnesium level was within normal limits at 2.1.  Ethanol level was within normal limits, less than 10.  COVID/influenza A/influenza B/RSV PCR were negative.  CT head without contrast per EDP: Was read as no acute intracranial abnormality.  ED treatment: Lactulose 30 g p.o. one-time dose.

## 2023-09-30 NOTE — Assessment & Plan Note (Signed)
On admission, Insulin SSI with at bedtime coverage ordered. Goal inpatient blood glucose levels 140-180. D50, 1 amp, as needed for hypoglycemia; 5 days ordered  10-01-2023 low CBG of 58 this AM. Improved with eating breakfast. Monitor. No scheduled DM meds. *update. CBG better after eating. Will stop jardiance. DC back to group home.

## 2023-10-01 ENCOUNTER — Encounter: Payer: Self-pay | Admitting: Internal Medicine

## 2023-10-01 ENCOUNTER — Other Ambulatory Visit: Payer: Self-pay

## 2023-10-01 ENCOUNTER — Encounter: Payer: Self-pay | Admitting: Neurosurgery

## 2023-10-01 DIAGNOSIS — I10 Essential (primary) hypertension: Secondary | ICD-10-CM

## 2023-10-01 DIAGNOSIS — N1831 Chronic kidney disease, stage 3a: Secondary | ICD-10-CM

## 2023-10-01 DIAGNOSIS — K7460 Unspecified cirrhosis of liver: Secondary | ICD-10-CM | POA: Diagnosis not present

## 2023-10-01 DIAGNOSIS — E119 Type 2 diabetes mellitus without complications: Secondary | ICD-10-CM

## 2023-10-01 DIAGNOSIS — K7682 Hepatic encephalopathy: Secondary | ICD-10-CM | POA: Diagnosis not present

## 2023-10-01 LAB — CBC
HCT: 32.9 % — ABNORMAL LOW (ref 39.0–52.0)
Hemoglobin: 11.2 g/dL — ABNORMAL LOW (ref 13.0–17.0)
MCH: 31.4 pg (ref 26.0–34.0)
MCHC: 34 g/dL (ref 30.0–36.0)
MCV: 92.2 fL (ref 80.0–100.0)
Platelets: 69 10*3/uL — ABNORMAL LOW (ref 150–400)
RBC: 3.57 MIL/uL — ABNORMAL LOW (ref 4.22–5.81)
RDW: 15.4 % (ref 11.5–15.5)
WBC: 2.8 10*3/uL — ABNORMAL LOW (ref 4.0–10.5)
nRBC: 0 % (ref 0.0–0.2)

## 2023-10-01 LAB — AMMONIA: Ammonia: 79 umol/L — ABNORMAL HIGH (ref 9–35)

## 2023-10-01 LAB — BASIC METABOLIC PANEL
Anion gap: 8 (ref 5–15)
BUN: 21 mg/dL (ref 8–23)
CO2: 20 mmol/L — ABNORMAL LOW (ref 22–32)
Calcium: 9.5 mg/dL (ref 8.9–10.3)
Chloride: 110 mmol/L (ref 98–111)
Creatinine, Ser: 1.3 mg/dL — ABNORMAL HIGH (ref 0.61–1.24)
GFR, Estimated: 58 mL/min — ABNORMAL LOW (ref >60)
Glucose, Bld: 80 mg/dL (ref 70–99)
Potassium: 3.8 mmol/L (ref 3.5–5.1)
Sodium: 138 mmol/L (ref 135–145)

## 2023-10-01 LAB — CBG MONITORING, ED
Glucose-Capillary: 58 mg/dL — ABNORMAL LOW (ref 70–99)
Glucose-Capillary: 123 mg/dL — ABNORMAL HIGH (ref 70–99)

## 2023-10-01 MED ORDER — LACTULOSE 10 GM/15ML PO SOLN: 20.0000 g | Freq: Two times a day (BID) | ORAL | 0 refills | Status: DC

## 2023-10-01 MED ORDER — QUETIAPINE FUMARATE 25 MG PO TABS
25.0000 mg | ORAL_TABLET | Freq: Every evening | ORAL | 0 refills | Status: DC | PRN
  Filled 2023-10-01: qty 30, 30d supply, fill #0

## 2023-10-01 MED ORDER — QUETIAPINE FUMARATE 25 MG PO TABS: 25.0000 mg | ORAL_TABLET | Freq: Every evening | ORAL | Status: DC | PRN

## 2023-10-01 MED ORDER — QUETIAPINE FUMARATE 25 MG PO TABS: 25.0000 mg | ORAL_TABLET | Freq: Every evening | ORAL | 0 refills | Status: DC | PRN

## 2023-10-01 MED ORDER — LACTULOSE 10 GM/15ML PO SOLN
20.0000 g | Freq: Two times a day (BID) | ORAL | 0 refills | Status: DC
  Filled 2023-10-01: qty 946, 16d supply, fill #0

## 2023-10-01 MED ORDER — LACTULOSE 10 GM/15ML PO SOLN: 20.0000 g | Freq: Two times a day (BID) | ORAL | Status: DC

## 2023-10-01 NOTE — ED Notes (Signed)
Pt taken to bathroom. Ambulated approx 15 feet with assistance. Pt urinated and had bowel movement with gas. Tolerated well.

## 2023-10-01 NOTE — ED Notes (Signed)
 Patient eating lunch tray at this time

## 2023-10-01 NOTE — Subjective & Objective (Signed)
Pt seen and examined. Pt lives at [facility]. Was admitted for hepatic encephalopathy. Pt aware he is in the hospital. Not aware of dates.  Ate the majority of his breakfast today.

## 2023-10-01 NOTE — Discharge Instructions (Signed)
Follow up with your primary care provider in 1-2 weeks after discharge Stop taking Jardiance due to low blood sugars Continue to take lactulose twice a day. Adjust frequency(from once a day to twice a day) to allow for 2-3 bowel movements per day.

## 2023-10-01 NOTE — ED Notes (Addendum)
Pt given 8 oz of orange juice and patient eating breakfast tray at this time.

## 2023-10-01 NOTE — Inpatient Diabetes Management (Signed)
Inpatient Diabetes Program Recommendations  AACE/ADA: New Consensus Statement on Inpatient Glycemic Control  Target Ranges:  Prepandial:   less than 140 mg/dL      Peak postprandial:   less than 180 mg/dL (1-2 hours)      Critically ill patients:  140 - 180 mg/dL    Latest Reference Range & Units 10/01/23 05:30  Glucose 70 - 99 mg/dL 80    Latest Reference Range & Units 09/30/23 17:01 09/30/23 17:51 09/30/23 21:26  Glucose-Capillary 70 - 99 mg/dL 68 (L) 95 161 (H)    Review of Glycemic Control  Diabetes history: DM2 Outpatient Diabetes medications: Jardiance 10 mg daily (for CHF) Current orders for Inpatient glycemic control: Novolog 0-15 units TID with meals, Novolog 0-5 units QHS  Inpatient Diabetes Program Recommendations:    Insulin: Please decrease Novolog correction to 0-6 units TID.  Thanks, Orlando Penner, RN, MSN, CDCES Diabetes Coordinator Inpatient Diabetes Program [phone] (Team Pager from 8am to 5pm)

## 2023-10-01 NOTE — Care Management CC44 (Signed)
 Condition Code 44 Documentation Completed  Patient Details  Name: [name] MRN: [id number] Date of Birth: November 22, 1950   Condition Code 44 given:  Yes Patient signature on Condition Code 44 notice:  Yes Documentation of 2 MD's agreement:  Yes Code 44 added to claim:  Yes  I, Silvano Molt, LCSW, verbally reviewed observation notice.  10/01/2023,   Silvano Molt, LCSW 10/01/2023, 2:32 PM

## 2023-10-01 NOTE — ED Notes (Signed)
Discussed with charge nurse, Beecher Mcardle, that pt has self removed 2 PIV's and will not keep one in as carefully as the 1 I started was secured. She agreed that emergency access would the only logical way to handle this pt.

## 2023-10-01 NOTE — ED Notes (Signed)
Group home manager- Achilles Dunk called at this time for discharge. [name] to come get patient shortly.

## 2023-10-01 NOTE — ED Notes (Addendum)
Patient's brief noted to be wet. Patient cleaned, placed in new brief and new bed lien placed at this time. Pt repositioned in bed at this time.

## 2023-10-01 NOTE — Discharge Summary (Addendum)
 [facility] Discharge Summary   Patient name: [name]  Admit date:     09/30/2023  Discharge date: 10/01/2023  Attending Physician: SHERRE GREIG SAILOR [[id number]]  Discharge Physician: Camellia Door   PCP: [facility]  Admitted From: Home  Disposition:  Home  Recommendations for Outpatient Follow-up:  Follow up with PCP in 1-2 weeks Please follow up on the following pending results:  Home Health:No Equipment/Devices: None  Discharge Condition:Stable CODE STATUS:FULL Diet recommendation: Diabetic Fluid Restriction: None  Hospital Summary: HPI: Mr. 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 coli bacteremia in October 2024, history of Klebsiella pneumonia bacteremia, chronic back pain status post discectomy, who presents to the emergency department from group home facility for chief concerns of altered mental status.  Per ED documentation, facility staff was not able to arouse patient this morning.  Vitals in the ED showed temperature of 98.1, respiration rate 14, heart rate 44, blood pressure 154/69, SpO2 100% room air.  ED treatment: Lactulose  30 g p.o. one-time dose.  Significant Events: Admitted 09/30/2023 for hepatic encephalopathy   Significant Labs: Serum sodium is 141, potassium 4.3, chloride 108, bicarb 22, BUN of 20, serum creatinine 1.39, EGFR 53, nonfasting blood glucose 80, WBC 2.1, hemoglobin 13.6, platelets of 65.  AST was elevated at 43.  ALT was not elevated. Ammonia was elevated at 96.  High sensitive troponin was 13.  UDS and UA has been ordered and pending collection at this time. Magnesium  level was within normal limits at 2.1.  Ethanol level was within normal limits, less than 10. COVID/influenza A/influenza B/RSV PCR were  negative.  Significant Imaging Studies: CT head without contrast per EDP: Was read as no acute intracranial abnormality. CXR no active disease  Antibiotic Therapy: Anti-infectives (From admission, onward)    Start     Dose/Rate Route Frequency Ordered Stop   09/30/23 1445  rifaximin  (XIFAXAN ) tablet 550 mg        550 mg Oral 2 times daily 09/30/23 1432         Procedures:   Consultants:    Hospital Course by Problem: * Hepatic encephalopathy (HCC) On admission, Etiology workup in progress. UDS, UA has been ordered by EDP and is pending collection at the time of this dictation. Check portable chest x-ray, procalcitonin. Addendum (13:44): CXR, procalcitonin, UA negative for nitrite leukocytes were negative. Query medication compliance vs possible progression of undiagnosed dementia. Agree with lactulose  30 g p.o. one-time dose. Strict I's and O's. Admit to telemetry medical, inpatient  10-01-2023 repeat NH3 today. Pt is aware he is in the hospital and states he has been living in a SNF for several months now. He seems to be pretty aware of his surrounding. Continue lactulose  tid and xifaxan  bid. UDS positive for TCA  *update. Repeat NH3 of 79. Pt is awake and alert now. Had low CBG this AM. Has eaten breakfast and lunch. CBG stable now. Will stop Jardiance . Continue with BID lactulose  at SNF. Continue with Xifaxan . Stable for DC back to group home.  Liver cirrhosis (HCC) On admission, pt on Home spironolactone  25 mg daily resumed on admission. Home lactulose  30g 3 times daily, rifaximin  550 mg p.o. twice daily were resumed on admission.  10-01-2023 pt has another epic chart. MRN: [id number].  Hx of cirrhosis, presumably from untreated hep C. CT abd/pelvis in  05-2023 showed Liver cirrhosis with portal hypertension. Continue with lactulose  tid.  10-01-2023. Continue with aldactone  and lasix  at SNF. Add scheduled lactulose . Continue xifaxan . DC to group home.  CKD stage 3a, GFR 45-59  ml/min (HCC) - baseline scr 1.3 10-01-2023 stable Scr of 1.39, BUN 20  Hyperlipidemia Patient has chronic AF elevation Atorvastatin  40 mg nightly resumed  CAD (coronary artery disease) On admission, continue Atorvastatin  40 mg nightly resumed on admission  10-01-2023 stable.  Diabetes mellitus type 2, noninsulin dependent (HCC) On admission, Insulin  SSI with at bedtime coverage ordered. Goal inpatient blood glucose levels 140-180. D50, 1 amp, as needed for hypoglycemia; 5 days ordered  10-01-2023 low CBG of 58 this AM. Improved with eating breakfast. Monitor. No scheduled DM meds. *update. CBG better after eating. Will stop jardiance . DC back to group home.  Essential hypertension On admission, continue Hydralazine  5 mg IV every 6 hours as needed for SBP greater than 170, 5 days ordered  10-01-2023 stable. Continue losartan  at group home.    Discharge Diagnoses:  Principal Problem:   Hepatic encephalopathy (HCC) Active Problems:   Liver cirrhosis (HCC)   Essential hypertension   Diabetes mellitus type 2, noninsulin dependent (HCC)   CAD (coronary artery disease)   Hyperlipidemia   CKD stage 3a, GFR 45-59 ml/min (HCC) - baseline scr 1.3   Discharge Instructions  Discharge Instructions     Call MD for:  difficulty breathing, headache or visual disturbances   Complete by: As directed    Call MD for:  extreme fatigue   Complete by: As directed    Call MD for:  hives   Complete by: As directed    Call MD for:  persistant dizziness or light-headedness   Complete by: As directed    Call MD for:  persistant nausea and vomiting   Complete by: As directed    Call MD for:  redness, tenderness, or signs of infection (pain, swelling, redness, odor or green/yellow discharge around incision site)   Complete by: As directed    Call MD for:  severe uncontrolled pain   Complete by: As directed    Call MD for:  temperature >100.4   Complete by: As directed    Diet - low sodium  heart healthy   Complete by: As directed    Discharge instructions   Complete by: As directed    1. Follow up with SNF provider as needed.   Increase activity slowly   Complete by: As directed       Allergies as of 10/01/2023       Reactions   Rsv Mrna Pre-f Virus Vaccine Swelling   Meloxicam    Other Reaction(s): ?overdose of local given at dentist--had trouble breathing   Codeine     Novocain [procaine]    Sulfa Antibiotics    hallucinations        Medication List     STOP taking these medications    Jardiance  10 MG Tabs tablet Generic drug: empagliflozin        TAKE these medications    atorvastatin  40 MG tablet Commonly known as: LIPITOR Take 40 mg by mouth daily.   citalopram  20 MG tablet Commonly known as: CELEXA  Take 20 mg by mouth daily.   furosemide  20 MG tablet Commonly known as: LASIX  Take 20 mg by mouth daily.   gabapentin  300 MG capsule Commonly known as: NEURONTIN  Take 300 mg by mouth 2 (two) times daily.   lactulose  10 GM/15ML solution Commonly known as: CHRONULAC   Take 30 mLs (20 g total) by mouth 2 (two) times daily.   losartan  50 MG tablet Commonly known as: COZAAR  Take 50 mg by mouth daily.   nitroGLYCERIN  0.4 MG/SPRAY spray Commonly known as: NITROLINGUAL  Place 1 spray under the tongue once as needed. If chest pain not resolved, call EMS.   pantoprazole  40 MG tablet Commonly known as: PROTONIX  Take 40 mg by mouth daily.   QUEtiapine  25 MG tablet Commonly known as: SEROQUEL  Take 1 tablet (25 mg total) by mouth at bedtime as needed (sleep, agitation). What changed:  medication strength how much to take when to take this reasons to take this   spironolactone  25 MG tablet Commonly known as: ALDACTONE  Take 25 mg by mouth daily.   Ventolin  HFA 108 (90 Base) MCG/ACT inhaler Generic drug: albuterol  Inhale 2 puffs into the lungs every 4 (four) hours as needed for wheezing.   albuterol  (2.5 MG/3ML) 0.083% nebulizer  solution Commonly known as: PROVENTIL  Take 2.5 mg by nebulization every 4 (four) hours as needed for wheezing or shortness of breath.   Xifaxan  550 MG Tabs tablet Generic drug: rifaximin  Take 550 mg by mouth 2 (two) times daily.        Allergies  Allergen Reactions   Rsv Mrna Pre-F Virus Vaccine Swelling   Meloxicam     Other Reaction(s): ?overdose of local given at dentist--had trouble breathing   Codeine     Novocain [Procaine]    Sulfa Antibiotics     hallucinations    Discharge Exam: Vitals:   10/01/23 0848 10/01/23 1230  BP: (!) 147/58 (!) 143/81  Pulse: (!) 56 (!) 47  Resp: 18 18  Temp: 98.8 F (37.1 C) 99.5 F (37.5 C)  SpO2: 100% 100%    Physical Exam Vitals and nursing note reviewed.  Constitutional:      Comments: Appears chronically ill Pt feeding himself breakfast  HENT:     Head: Normocephalic and atraumatic.     Nose: Nose normal.  Cardiovascular:     Rate and Rhythm: Normal rate and regular rhythm.  Pulmonary:     Effort: Pulmonary effort is normal.     Breath sounds: Normal breath sounds.  Abdominal:     General: Abdomen is protuberant. Bowel sounds are normal. There is no distension.     Palpations: Abdomen is soft.  Skin:    General: Skin is warm and dry.     Capillary Refill: Capillary refill takes less than 2 seconds.  Neurological:     Mental Status: He is alert. He is disoriented.     The results of significant diagnostics from this hospitalization (including imaging, microbiology, ancillary and laboratory) are listed below for reference.    Microbiology: Recent Results (from the past 240 hours)  Resp panel by RT-PCR (RSV, Flu A&B, Covid) Anterior Nasal Swab     Status: None   Collection Time: 09/30/23  8:38 AM   Specimen: Anterior Nasal Swab  Result Value Ref Range Status   SARS Coronavirus 2 by RT PCR NEGATIVE NEGATIVE Final    Comment: (NOTE) SARS-CoV-2 target nucleic acids are NOT DETECTED.  The SARS-CoV-2 RNA is  generally detectable in upper respiratory specimens during the acute phase of infection. The lowest concentration of SARS-CoV-2 viral copies this assay can detect is 138 copies/mL. A negative result does not preclude SARS-Cov-2 infection and should not be used as the sole basis for treatment or other patient management decisions. A negative result may occur with  improper specimen collection/handling, submission  of specimen other than nasopharyngeal swab, presence of viral mutation(s) within the areas targeted by this assay, and inadequate number of viral copies(<138 copies/mL). A negative result must be combined with clinical observations, patient history, and epidemiological information. The expected result is Negative.  Fact Sheet for Patients:  bloggercourse.com  Fact Sheet for Healthcare Providers:  seriousbroker.it  This test is no t yet approved or cleared by the [place]  FDA and  has been authorized for detection and/or diagnosis of SARS-CoV-2 by FDA under an Emergency Use Authorization (EUA). This EUA will remain  in effect (meaning this test can be used) for the duration of the COVID-19 declaration under Section 564(b)(1) of the Act, 21 U.S.C.section 360bbb-3(b)(1), unless the authorization is terminated  or revoked sooner.       Influenza A by PCR NEGATIVE NEGATIVE Final   Influenza B by PCR NEGATIVE NEGATIVE Final    Comment: (NOTE) The Xpert Xpress SARS-CoV-2/FLU/RSV plus assay is intended as an aid in the diagnosis of influenza from Nasopharyngeal swab specimens and should not be used as a sole basis for treatment. Nasal washings and aspirates are unacceptable for Xpert Xpress SARS-CoV-2/FLU/RSV testing.  Fact Sheet for Patients: bloggercourse.com  Fact Sheet for Healthcare Providers: seriousbroker.it  This test is not yet approved or cleared by the [place] FDA and has been authorized for detection and/or diagnosis of SARS-CoV-2 by FDA under an Emergency Use Authorization (EUA). This EUA will remain in effect (meaning this test can be used) for the duration of the COVID-19 declaration under Section 564(b)(1) of the Act, 21 U.S.C. section 360bbb-3(b)(1), unless the authorization is terminated or revoked.     Resp Syncytial Virus by PCR NEGATIVE NEGATIVE Final    Comment: (NOTE) Fact Sheet for Patients: bloggercourse.com  Fact Sheet for Healthcare Providers: seriousbroker.it  This test is not yet approved or cleared by the [place]  FDA and has been authorized for detection and/or diagnosis of SARS-CoV-2 by FDA under an Emergency Use Authorization (EUA). This EUA will remain in effect (meaning this test can be used) for the duration of the COVID-19 declaration under Section 564(b)(1) of the Act, 21 U.S.C. section 360bbb-3(b)(1), unless the authorization is terminated or revoked.  Performed at [facility], [address] [place], [place] [address]      Labs: BNP (last 3 results) No results for input(s): BNP in the last 8760 hours. Basic Metabolic Panel: Recent Labs  Lab 09/30/23 0838 10/01/23 0530  NA 141 138  K 4.3 3.8  CL 108 110  CO2 22 20*  GLUCOSE 80 80  BUN 20 21  CREATININE 1.39* 1.30*  CALCIUM  10.0 9.5  MG 2.2  --    Liver Function Tests: Recent Labs  Lab 09/30/23 0838  AST 43*  ALT 26  ALKPHOS 81  BILITOT 2.1*  PROT 7.2  ALBUMIN  3.0*   No results for input(s): LIPASE, AMYLASE in the last 168 hours. Recent Labs  Lab 09/30/23 1152 10/01/23 1155  AMMONIA 96* 79*   CBC: Recent Labs  Lab 09/30/23 0838 10/01/23 0530  WBC 2.1* 2.8*  HGB 13.6 11.2*  HCT 40.0 32.9*  MCV 92.4 92.2  PLT 65* 69*   Cardiac Enzymes: No results for input(s): CKTOTAL, CKMB, CKMBINDEX, TROPONINI in the last 168 hours. BNP: No results  for input(s): BNP in the last 168 hours. CBG: Recent Labs  Lab 09/30/23 1701 09/30/23 1751 09/30/23 2126 10/01/23 0806 10/01/23 1113  GLUCAP 68* 95 125* 58* 123*   D-Dimer No  results for input(s): DDIMER in the last 72 hours. Hgb A1c Recent Labs    09/30/23 0837  HGBA1C 4.9   Lipid Profile No results for input(s): CHOL, HDL, LDLCALC, TRIG, CHOLHDL, LDLDIRECT in the last 72 hours. Thyroid  function studies Recent Labs    09/30/23 0838  TSH 1.272   Anemia work up No results for input(s): VITAMINB12, FOLATE, FERRITIN, TIBC, IRON, RETICCTPCT in the last 72 hours. Urinalysis    Component Value Date/Time   COLORURINE YELLOW (A) 09/30/2023 0838   APPEARANCEUR CLEAR (A) 09/30/2023 0838   LABSPEC 1.015 09/30/2023 0838   PHURINE 7.0 09/30/2023 0838   GLUCOSEU >=500 (A) 09/30/2023 0838   HGBUR NEGATIVE 09/30/2023 0838   BILIRUBINUR NEGATIVE 09/30/2023 0838   KETONESUR NEGATIVE 09/30/2023 0838   PROTEINUR NEGATIVE 09/30/2023 0838   NITRITE NEGATIVE 09/30/2023 0838   LEUKOCYTESUR NEGATIVE 09/30/2023 0838   Sepsis Labs Recent Labs  Lab 09/30/23 0838 10/01/23 0530  WBC 2.1* 2.8*   Microbiology Recent Results (from the past 240 hours)  Resp panel by RT-PCR (RSV, Flu A&B, Covid) Anterior Nasal Swab     Status: None   Collection Time: 09/30/23  8:38 AM   Specimen: Anterior Nasal Swab  Result Value Ref Range Status   SARS Coronavirus 2 by RT PCR NEGATIVE NEGATIVE Final    Comment: (NOTE) SARS-CoV-2 target nucleic acids are NOT DETECTED.  The SARS-CoV-2 RNA is generally detectable in upper respiratory specimens during the acute phase of infection. The lowest concentration of SARS-CoV-2 viral copies this assay can detect is 138 copies/mL. A negative result does not preclude SARS-Cov-2 infection and should not be used as the sole basis for treatment or other patient management decisions. A negative result may occur with  improper specimen  collection/handling, submission of specimen other than nasopharyngeal swab, presence of viral mutation(s) within the areas targeted by this assay, and inadequate number of viral copies(<138 copies/mL). A negative result must be combined with clinical observations, patient history, and epidemiological information. The expected result is Negative.  Fact Sheet for Patients:  bloggercourse.com  Fact Sheet for Healthcare Providers:  seriousbroker.it  This test is no t yet approved or cleared by the [place]  FDA and  has been authorized for detection and/or diagnosis of SARS-CoV-2 by FDA under an Emergency Use Authorization (EUA). This EUA will remain  in effect (meaning this test can be used) for the duration of the COVID-19 declaration under Section 564(b)(1) of the Act, 21 U.S.C.section 360bbb-3(b)(1), unless the authorization is terminated  or revoked sooner.       Influenza A by PCR NEGATIVE NEGATIVE Final   Influenza B by PCR NEGATIVE NEGATIVE Final    Comment: (NOTE) The Xpert Xpress SARS-CoV-2/FLU/RSV plus assay is intended as an aid in the diagnosis of influenza from Nasopharyngeal swab specimens and should not be used as a sole basis for treatment. Nasal washings and aspirates are unacceptable for Xpert Xpress SARS-CoV-2/FLU/RSV testing.  Fact Sheet for Patients: bloggercourse.com  Fact Sheet for Healthcare Providers: seriousbroker.it  This test is not yet approved or cleared by the [place]  FDA and has been authorized for detection and/or diagnosis of SARS-CoV-2 by FDA under an Emergency Use Authorization (EUA). This EUA will remain in effect (meaning this test can be used) for the duration of the COVID-19 declaration under Section 564(b)(1) of the Act, 21 U.S.C. section 360bbb-3(b)(1), unless the authorization is terminated or revoked.     Resp Syncytial  Virus by PCR NEGATIVE NEGATIVE Final    Comment: (  NOTE) Fact Sheet for Patients: bloggercourse.com  Fact Sheet for Healthcare Providers: seriousbroker.it  This test is not yet approved or cleared by the [place]  FDA and has been authorized for detection and/or diagnosis of SARS-CoV-2 by FDA under an Emergency Use Authorization (EUA). This EUA will remain in effect (meaning this test can be used) for the duration of the COVID-19 declaration under Section 564(b)(1) of the Act, 21 U.S.C. section 360bbb-3(b)(1), unless the authorization is terminated or revoked.  Performed at [facility], [address] [place], [place] [address]     Procedures/Studies: [facility] Chest Port 1 View Result Date: 09/30/2023 CLINICAL DATA:  Altered mental status EXAM: PORTABLE CHEST 1 VIEW COMPARISON:  Chest x-ray 07/29/2023 FINDINGS: The heart size and mediastinal contours are within normal limits. Both lungs are clear. Cervical spinal fusion plate is present. No acute fractures are seen. IMPRESSION: No active disease. Electronically Signed   By: Greig Pique M.D.   On: 09/30/2023 15:34   CT Head Wo Contrast Result Date: 09/30/2023 CLINICAL DATA:  Mental status change, unknown cause EXAM: CT HEAD WITHOUT CONTRAST TECHNIQUE: Contiguous axial images were obtained from the base of the skull through the vertex without intravenous contrast. RADIATION DOSE REDUCTION: This exam was performed according to the departmental dose-optimization program which includes automated exposure control, adjustment of the mA and/or kV according to patient size and/or use of iterative reconstruction technique. COMPARISON:  None Available. FINDINGS: Brain: No evidence of large-territorial acute infarction. No parenchymal hemorrhage. No mass lesion. No extra-axial collection. No mass effect or midline shift. No hydrocephalus. Basilar cisterns are patent. Vascular: No hyperdense  vessel. Skull: No acute fracture or focal lesion. Sinuses/Orbits: Paranasal sinuses and mastoid air cells are clear. The orbits are unremarkable. Other: None. IMPRESSION: No acute intracranial abnormality. Electronically Signed   By: Morgane  Naveau M.D.   On: 09/30/2023 09:08    Time coordinating discharge: 45 mins  SIGNED:  Camellia Door, DO [facility] 10/01/23, 1:47 PM

## 2023-10-01 NOTE — Progress Notes (Signed)
 PROGRESS NOTE    [name]  FMW:[id number] DOB: [birth year] DOA: 09/30/2023 PCP: [facility]  Subjective: Pt seen and examined. Pt lives at [facility]. Was admitted for hepatic encephalopathy. Pt aware he is in the hospital. Not aware of dates.  Ate the majority of his breakfast today.   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 coli bacteremia in October 2024, history of Klebsiella pneumonia bacteremia, chronic back pain status post discectomy, who presents to the emergency department from group home facility for chief concerns of altered mental status.  Per ED documentation, facility staff was not able to arouse patient this morning.  Vitals in the ED showed temperature of 98.1, respiration rate 14, heart rate 44, blood pressure 154/69, SpO2 100% room air.  ED treatment: Lactulose  30 g p.o. one-time dose.  Significant Events: Admitted 09/30/2023 for hepatic encephalopathy   Significant Labs: Serum sodium is 141, potassium 4.3, chloride 108, bicarb 22, BUN of 20, serum creatinine 1.39, EGFR 53, nonfasting blood glucose 80, WBC 2.1, hemoglobin 13.6, platelets of 65.  AST was elevated at 43.  ALT was not elevated. Ammonia was elevated at 96.  High sensitive troponin was 13.  UDS and UA has been ordered and pending collection at this time. Magnesium  level was within normal limits at 2.1.  Ethanol level was within normal limits, less than 10. COVID/influenza A/influenza B/RSV PCR were negative.  Significant Imaging Studies: CT head without contrast per EDP: Was read as no acute intracranial abnormality. CXR no active disease  Antibiotic Therapy: Anti-infectives (From admission, onward)    Start     Dose/Rate Route Frequency Ordered Stop   09/30/23 1445   rifaximin  (XIFAXAN ) tablet 550 mg        550 mg Oral 2 times daily 09/30/23 1432         Procedures:   Consultants:     Assessment and Plan: * Hepatic encephalopathy (HCC) On admission, Etiology workup in progress. UDS, UA has been ordered by EDP and is pending collection at the time of this dictation. Check portable chest x-ray, procalcitonin. Addendum (13:44): CXR, procalcitonin, UA negative for nitrite leukocytes were negative. Query medication compliance vs possible progression of undiagnosed dementia. Agree with lactulose  30 g p.o. one-time dose. Strict I's and O's. Admit to telemetry medical, inpatient  10-01-2023 repeat NH3 today. Pt is aware he is in the hospital and states he has been living in a SNF for several months now. He seems to be pretty aware of his surrounding. Continue lactulose  tid and xifaxan  bid. UDS positive for TCA  Liver cirrhosis (HCC) On admission, pt on Home spironolactone  25 mg daily resumed on admission. Home lactulose  30g 3 times daily, rifaximin  550 mg p.o. twice daily were resumed on admission.  10-01-2023 pt has another epic chart. MRN: [id number].  Hx of cirrhosis, presumably from untreated hep C. CT abd/pelvis in 05-2023 showed Liver cirrhosis with portal hypertension. Continue with lactulose  tid.  CKD stage 3a, GFR 45-59 ml/min (HCC) - baseline scr 1.3 10-01-2023 stable Scr of 1.39, BUN 20  Hyperlipidemia Patient has chronic AF elevation Atorvastatin  40 mg nightly resumed  CAD (coronary artery disease) On admission, continue Atorvastatin  40 mg nightly resumed on admission  10-01-2023 stable.  Diabetes mellitus type 2, noninsulin dependent (HCC) On admission, Insulin  SSI with at bedtime coverage ordered. Goal  inpatient blood glucose levels 140-180. D50, 1 amp, as needed for hypoglycemia; 5 days ordered  10-01-2023 low CBG of 58 this AM. Improved with eating breakfast. Monitor. No scheduled DM meds.  Essential hypertension On admission,  continue Hydralazine  5 mg IV every 6 hours as needed for SBP greater than 170, 5 days ordered  10-01-2023 stable.   DVT prophylaxis: Place TED hose Start: 09/30/23 1308    Code Status: Full Code Family Communication: no family at bedside Disposition Plan: SNF Reason for continuing need for hospitalization: monitoring mental status and CBG  Objective: Vitals:   09/30/23 1654 10/01/23 0300 10/01/23 0540 10/01/23 0848  BP: 127/79 (!) 108/56 128/63 (!) 147/58  Pulse: (!) 54 (!) 43 (!) 46 (!) 56  Resp: 14 14 12 18   Temp:   98.3 F (36.8 C) 98.8 F (37.1 C)  TempSrc:   Oral Oral  SpO2: 100% 100% 100% 100%  Weight:      Height:       No intake or output data in the 24 hours ending 10/01/23 1132 Filed Weights   09/30/23 0829  Weight: 77.1 kg    Examination:  Physical Exam Vitals and nursing note reviewed.  Constitutional:      Comments: Appears chronically ill Pt feeding himself breakfast  HENT:     Head: Normocephalic and atraumatic.     Nose: Nose normal.  Cardiovascular:     Rate and Rhythm: Normal rate and regular rhythm.  Pulmonary:     Effort: Pulmonary effort is normal.     Breath sounds: Normal breath sounds.  Abdominal:     General: Abdomen is protuberant. Bowel sounds are normal. There is no distension.     Palpations: Abdomen is soft.  Skin:    General: Skin is warm and dry.     Capillary Refill: Capillary refill takes less than 2 seconds.  Neurological:     Mental Status: He is alert. He is disoriented.     Data Reviewed: I have personally reviewed following labs and imaging studies  CBC: Recent Labs  Lab 09/30/23 0838 10/01/23 0530  WBC 2.1* 2.8*  HGB 13.6 11.2*  HCT 40.0 32.9*  MCV 92.4 92.2  PLT 65* 69*   Basic Metabolic Panel: Recent Labs  Lab 09/30/23 0838 10/01/23 0530  NA 141 138  K 4.3 3.8  CL 108 110  CO2 22 20*  GLUCOSE 80 80  BUN 20 21  CREATININE 1.39* 1.30*  CALCIUM  10.0 9.5  MG 2.2  --    GFR: Estimated  Creatinine Clearance: 47.7 mL/min (A) (by C-G formula based on SCr of 1.3 mg/dL (H)). Liver Function Tests: Recent Labs  Lab 09/30/23 0838  AST 43*  ALT 26  ALKPHOS 81  BILITOT 2.1*  PROT 7.2  ALBUMIN  3.0*    Recent Labs  Lab 09/30/23 1152  AMMONIA 96*   HbA1C: Recent Labs    09/30/23 0837  HGBA1C 4.9   CBG: Recent Labs  Lab 09/30/23 1701 09/30/23 1751 09/30/23 2126 10/01/23 0806 10/01/23 1113  GLUCAP 68* 95 125* 58* 123*   Thyroid  Function Tests: Recent Labs    09/30/23 0838  TSH 1.272   Sepsis Labs: Recent Labs  Lab 09/30/23 0837  PROCALCITON <0.10    Recent Results (from the past 240 hours)  Resp panel by RT-PCR (RSV, Flu A&B, Covid) Anterior Nasal Swab     Status: None   Collection Time: 09/30/23  8:38 AM   Specimen: Anterior Nasal Swab  Result Value  Ref Range Status   SARS Coronavirus 2 by RT PCR NEGATIVE NEGATIVE Final    Comment: (NOTE) SARS-CoV-2 target nucleic acids are NOT DETECTED.  The SARS-CoV-2 RNA is generally detectable in upper respiratory specimens during the acute phase of infection. The lowest concentration of SARS-CoV-2 viral copies this assay can detect is 138 copies/mL. A negative result does not preclude SARS-Cov-2 infection and should not be used as the sole basis for treatment or other patient management decisions. A negative result may occur with  improper specimen collection/handling, submission of specimen other than nasopharyngeal swab, presence of viral mutation(s) within the areas targeted by this assay, and inadequate number of viral copies(<138 copies/mL). A negative result must be combined with clinical observations, patient history, and epidemiological information. The expected result is Negative.  Fact Sheet for Patients:  bloggercourse.com  Fact Sheet for Healthcare Providers:  seriousbroker.it  This test is no t yet approved or cleared by the [place]   FDA and  has been authorized for detection and/or diagnosis of SARS-CoV-2 by FDA under an Emergency Use Authorization (EUA). This EUA will remain  in effect (meaning this test can be used) for the duration of the COVID-19 declaration under Section 564(b)(1) of the Act, 21 U.S.C.section 360bbb-3(b)(1), unless the authorization is terminated  or revoked sooner.       Influenza A by PCR NEGATIVE NEGATIVE Final   Influenza B by PCR NEGATIVE NEGATIVE Final    Comment: (NOTE) The Xpert Xpress SARS-CoV-2/FLU/RSV plus assay is intended as an aid in the diagnosis of influenza from Nasopharyngeal swab specimens and should not be used as a sole basis for treatment. Nasal washings and aspirates are unacceptable for Xpert Xpress SARS-CoV-2/FLU/RSV testing.  Fact Sheet for Patients: bloggercourse.com  Fact Sheet for Healthcare Providers: seriousbroker.it  This test is not yet approved or cleared by the [place]  FDA and has been authorized for detection and/or diagnosis of SARS-CoV-2 by FDA under an Emergency Use Authorization (EUA). This EUA will remain in effect (meaning this test can be used) for the duration of the COVID-19 declaration under Section 564(b)(1) of the Act, 21 U.S.C. section 360bbb-3(b)(1), unless the authorization is terminated or revoked.     Resp Syncytial Virus by PCR NEGATIVE NEGATIVE Final    Comment: (NOTE) Fact Sheet for Patients: bloggercourse.com  Fact Sheet for Healthcare Providers: seriousbroker.it  This test is not yet approved or cleared by the [place]  FDA and has been authorized for detection and/or diagnosis of SARS-CoV-2 by FDA under an Emergency Use Authorization (EUA). This EUA will remain in effect (meaning this test can be used) for the duration of the COVID-19 declaration under Section 564(b)(1) of the Act, 21 U.S.C. section  360bbb-3(b)(1), unless the authorization is terminated or revoked.  Performed at [facility], [address], [place], [place] [address]      Radiology Studies: DG Chest Paraje 1 View Result Date: 09/30/2023 CLINICAL DATA:  Altered mental status EXAM: PORTABLE CHEST 1 VIEW COMPARISON:  Chest x-ray 07/29/2023 FINDINGS: The heart size and mediastinal contours are within normal limits. Both lungs are clear. Cervical spinal fusion plate is present. No acute fractures are seen. IMPRESSION: No active disease. Electronically Signed   By: Greig Pique M.D.   On: 09/30/2023 15:34   CT Head Wo Contrast Result Date: 09/30/2023 CLINICAL DATA:  Mental status change, unknown cause EXAM: CT HEAD WITHOUT CONTRAST TECHNIQUE: Contiguous axial images were obtained from the base of the skull through the vertex without intravenous contrast. RADIATION  DOSE REDUCTION: This exam was performed according to the departmental dose-optimization program which includes automated exposure control, adjustment of the mA and/or kV according to patient size and/or use of iterative reconstruction technique. COMPARISON:  None Available. FINDINGS: Brain: No evidence of large-territorial acute infarction. No parenchymal hemorrhage. No mass lesion. No extra-axial collection. No mass effect or midline shift. No hydrocephalus. Basilar cisterns are patent. Vascular: No hyperdense vessel. Skull: No acute fracture or focal lesion. Sinuses/Orbits: Paranasal sinuses and mastoid air cells are clear. The orbits are unremarkable. Other: None. IMPRESSION: No acute intracranial abnormality. Electronically Signed   By: Morgane  Naveau M.D.   On: 09/30/2023 09:08    Scheduled Meds:  atorvastatin   40 mg Oral QHS   insulin  aspart  0-15 Units Subcutaneous TID WC   insulin  aspart  0-5 Units Subcutaneous QHS   lactulose   30 g Oral TID   rifaximin   550 mg Oral BID   spironolactone   25 mg Oral Daily   Continuous Infusions:   LOS: 1 day    Time spent: 40 minutes  Camellia Door, DO  [facility]  10/01/2023, 11:32 AM

## 2023-10-01 NOTE — Assessment & Plan Note (Signed)
10-01-2023 stable Scr of 1.39, BUN 20

## 2023-10-01 NOTE — ED Notes (Signed)
Patient ate 100% of breakfast tray

## 2023-10-01 NOTE — Care Management Important Message (Incomplete)
Important Message  Patient Details  Name: Jazz Rogala MRN: [id number] Date of Birth: [birth year]   Important Message Given:        Tory Emerald, LCSW 10/01/2023, 2:04 PM

## 2023-10-04 ENCOUNTER — Telehealth: Payer: Self-pay | Admitting: Family

## 2023-10-04 NOTE — Telephone Encounter (Signed)
 Pt vm full unable to r/s appt n/s on 09/10/23

## 2023-10-11 ENCOUNTER — Other Ambulatory Visit: Payer: Self-pay

## 2023-10-21 ENCOUNTER — Emergency Department: Payer: 59

## 2023-10-21 ENCOUNTER — Inpatient Hospital Stay
Admission: EM | Admit: 2023-10-21 | Discharge: 2023-10-26 | DRG: 442 | Disposition: A | Discharge status: Home Health | Payer: 59 | Attending: Internal Medicine | Admitting: Internal Medicine

## 2023-10-21 ENCOUNTER — Other Ambulatory Visit: Payer: Self-pay

## 2023-10-21 DIAGNOSIS — I130 Hypertensive heart and chronic kidney disease with heart failure and stage 1 through stage 4 chronic kidney disease, or unspecified chronic kidney disease: Secondary | ICD-10-CM | POA: Diagnosis present

## 2023-10-21 DIAGNOSIS — F32A Depression, unspecified: Secondary | ICD-10-CM | POA: Diagnosis present

## 2023-10-21 DIAGNOSIS — I272 Pulmonary hypertension, unspecified: Secondary | ICD-10-CM | POA: Diagnosis present

## 2023-10-21 DIAGNOSIS — Z1152 Encounter for screening for COVID-19: Secondary | ICD-10-CM

## 2023-10-21 DIAGNOSIS — K7682 Hepatic encephalopathy: Principal | ICD-10-CM | POA: Diagnosis present

## 2023-10-21 DIAGNOSIS — Z8616 Personal history of COVID-19: Secondary | ICD-10-CM

## 2023-10-21 DIAGNOSIS — Z66 Do not resuscitate: Secondary | ICD-10-CM | POA: Diagnosis present

## 2023-10-21 DIAGNOSIS — E669 Obesity, unspecified: Secondary | ICD-10-CM | POA: Diagnosis present

## 2023-10-21 DIAGNOSIS — F17210 Nicotine dependence, cigarettes, uncomplicated: Secondary | ICD-10-CM | POA: Diagnosis present

## 2023-10-21 DIAGNOSIS — E782 Mixed hyperlipidemia: Secondary | ICD-10-CM | POA: Diagnosis not present

## 2023-10-21 DIAGNOSIS — B182 Chronic viral hepatitis C: Secondary | ICD-10-CM | POA: Diagnosis present

## 2023-10-21 DIAGNOSIS — Z981 Arthrodesis status: Secondary | ICD-10-CM

## 2023-10-21 DIAGNOSIS — Z887 Allergy status to serum and vaccine status: Secondary | ICD-10-CM

## 2023-10-21 DIAGNOSIS — J439 Emphysema, unspecified: Secondary | ICD-10-CM

## 2023-10-21 DIAGNOSIS — Z91030 Bee allergy status: Secondary | ICD-10-CM

## 2023-10-21 DIAGNOSIS — K769 Liver disease, unspecified: Secondary | ICD-10-CM | POA: Diagnosis present

## 2023-10-21 DIAGNOSIS — Z885 Allergy status to narcotic agent status: Secondary | ICD-10-CM

## 2023-10-21 DIAGNOSIS — I5A Non-ischemic myocardial injury (non-traumatic): Secondary | ICD-10-CM | POA: Diagnosis present

## 2023-10-21 DIAGNOSIS — E11649 Type 2 diabetes mellitus with hypoglycemia without coma: Secondary | ICD-10-CM | POA: Diagnosis not present

## 2023-10-21 DIAGNOSIS — K219 Gastro-esophageal reflux disease without esophagitis: Secondary | ICD-10-CM | POA: Diagnosis present

## 2023-10-21 DIAGNOSIS — E1122 Type 2 diabetes mellitus with diabetic chronic kidney disease: Secondary | ICD-10-CM | POA: Diagnosis present

## 2023-10-21 DIAGNOSIS — J449 Chronic obstructive pulmonary disease, unspecified: Secondary | ICD-10-CM | POA: Diagnosis present

## 2023-10-21 DIAGNOSIS — Z884 Allergy status to anesthetic agent status: Secondary | ICD-10-CM

## 2023-10-21 DIAGNOSIS — F329 Major depressive disorder, single episode, unspecified: Secondary | ICD-10-CM | POA: Diagnosis present

## 2023-10-21 DIAGNOSIS — N1831 Chronic kidney disease, stage 3a: Secondary | ICD-10-CM | POA: Diagnosis present

## 2023-10-21 DIAGNOSIS — K7460 Unspecified cirrhosis of liver: Secondary | ICD-10-CM | POA: Diagnosis not present

## 2023-10-21 DIAGNOSIS — Z7984 Long term (current) use of oral hypoglycemic drugs: Secondary | ICD-10-CM

## 2023-10-21 DIAGNOSIS — I10 Essential (primary) hypertension: Secondary | ICD-10-CM | POA: Diagnosis present

## 2023-10-21 DIAGNOSIS — Z6829 Body mass index (BMI) 29.0-29.9, adult: Secondary | ICD-10-CM

## 2023-10-21 DIAGNOSIS — Z87892 Personal history of anaphylaxis: Secondary | ICD-10-CM

## 2023-10-21 DIAGNOSIS — F419 Anxiety disorder, unspecified: Secondary | ICD-10-CM | POA: Diagnosis present

## 2023-10-21 DIAGNOSIS — E785 Hyperlipidemia, unspecified: Secondary | ICD-10-CM | POA: Diagnosis present

## 2023-10-21 DIAGNOSIS — J4489 Other specified chronic obstructive pulmonary disease: Secondary | ICD-10-CM | POA: Diagnosis present

## 2023-10-21 DIAGNOSIS — Z882 Allergy status to sulfonamides status: Secondary | ICD-10-CM

## 2023-10-21 DIAGNOSIS — Z87442 Personal history of urinary calculi: Secondary | ICD-10-CM

## 2023-10-21 DIAGNOSIS — Z8673 Personal history of transient ischemic attack (TIA), and cerebral infarction without residual deficits: Secondary | ICD-10-CM

## 2023-10-21 DIAGNOSIS — Z79899 Other long term (current) drug therapy: Secondary | ICD-10-CM

## 2023-10-21 DIAGNOSIS — I2510 Atherosclerotic heart disease of native coronary artery without angina pectoris: Secondary | ICD-10-CM | POA: Diagnosis present

## 2023-10-21 DIAGNOSIS — K7469 Other cirrhosis of liver: Principal | ICD-10-CM | POA: Diagnosis present

## 2023-10-21 DIAGNOSIS — D61818 Other pancytopenia: Secondary | ICD-10-CM | POA: Diagnosis present

## 2023-10-21 DIAGNOSIS — E1129 Type 2 diabetes mellitus with other diabetic kidney complication: Secondary | ICD-10-CM | POA: Diagnosis present

## 2023-10-21 DIAGNOSIS — I2489 Other forms of acute ischemic heart disease: Secondary | ICD-10-CM | POA: Diagnosis present

## 2023-10-21 DIAGNOSIS — Z8249 Family history of ischemic heart disease and other diseases of the circulatory system: Secondary | ICD-10-CM

## 2023-10-21 DIAGNOSIS — I5032 Chronic diastolic (congestive) heart failure: Secondary | ICD-10-CM | POA: Diagnosis present

## 2023-10-21 DIAGNOSIS — Z886 Allergy status to analgesic agent status: Secondary | ICD-10-CM

## 2023-10-21 DIAGNOSIS — Z7982 Long term (current) use of aspirin: Secondary | ICD-10-CM

## 2023-10-21 LAB — CBC WITH DIFFERENTIAL/PLATELET
Abs Immature Granulocytes: 0.01 10*3/uL (ref 0.00–0.07)
Basophils Absolute: 0 10*3/uL (ref 0.0–0.1)
Basophils Relative: 1 %
Eosinophils Absolute: 0 10*3/uL (ref 0.0–0.5)
Eosinophils Relative: 2 %
HCT: 31.6 % — ABNORMAL LOW (ref 39.0–52.0)
Hemoglobin: 11.2 g/dL — ABNORMAL LOW (ref 13.0–17.0)
Immature Granulocytes: 0 %
Lymphocytes Relative: 35 %
Lymphs Abs: 0.8 10*3/uL (ref 0.7–4.0)
MCH: 31.5 pg (ref 26.0–34.0)
MCHC: 35.4 g/dL (ref 30.0–36.0)
MCV: 89 fL (ref 80.0–100.0)
Monocytes Absolute: 0.3 10*3/uL (ref 0.1–1.0)
Monocytes Relative: 12 %
Neutro Abs: 1.1 10*3/uL — ABNORMAL LOW (ref 1.7–7.7)
Neutrophils Relative %: 50 %
Platelets: 96 10*3/uL — ABNORMAL LOW (ref 150–400)
RBC: 3.55 MIL/uL — ABNORMAL LOW (ref 4.22–5.81)
RDW: 14.8 % (ref 11.5–15.5)
WBC: 2.3 10*3/uL — ABNORMAL LOW (ref 4.0–10.5)
nRBC: 0 % (ref 0.0–0.2)

## 2023-10-21 LAB — BASIC METABOLIC PANEL
Anion gap: 9 (ref 5–15)
BUN: 19 mg/dL (ref 8–23)
CO2: 22 mmol/L (ref 22–32)
Calcium: 10.1 mg/dL (ref 8.9–10.3)
Chloride: 111 mmol/L (ref 98–111)
Creatinine, Ser: 1.44 mg/dL — ABNORMAL HIGH (ref 0.61–1.24)
GFR, Estimated: 51 mL/min — ABNORMAL LOW (ref >60)
Glucose, Bld: 101 mg/dL — ABNORMAL HIGH (ref 70–99)
Potassium: 3.7 mmol/L (ref 3.5–5.1)
Sodium: 142 mmol/L (ref 135–145)

## 2023-10-21 LAB — APTT: aPTT: 36 s (ref 24–36)

## 2023-10-21 LAB — RESP PANEL BY RT-PCR (RSV, FLU A&B, COVID)  RVPGX2
Influenza A by PCR: NEGATIVE
Influenza B by PCR: NEGATIVE
Resp Syncytial Virus by PCR: NEGATIVE
SARS Coronavirus 2 by RT PCR: NEGATIVE

## 2023-10-21 LAB — HEPATIC FUNCTION PANEL
ALT: 24 U/L (ref 0–44)
AST: 41 U/L (ref 15–41)
Albumin: 2.6 g/dL — ABNORMAL LOW (ref 3.5–5.0)
Alkaline Phosphatase: 75 U/L (ref 38–126)
Bilirubin, Direct: 0.3 mg/dL — ABNORMAL HIGH (ref 0.0–0.2)
Indirect Bilirubin: 1.1 mg/dL — ABNORMAL HIGH (ref 0.3–0.9)
Total Bilirubin: 1.4 mg/dL — ABNORMAL HIGH (ref 0.0–1.2)
Total Protein: 6.2 g/dL — ABNORMAL LOW (ref 6.5–8.1)

## 2023-10-21 LAB — PROTIME-INR
INR: 1.3 — ABNORMAL HIGH (ref 0.8–1.2)
Prothrombin Time: 16.2 s — ABNORMAL HIGH (ref 11.4–15.2)

## 2023-10-21 LAB — CBG MONITORING, ED: Glucose-Capillary: 82 mg/dL (ref 70–99)

## 2023-10-21 LAB — BRAIN NATRIURETIC PEPTIDE: B Natriuretic Peptide: 250.6 pg/mL — ABNORMAL HIGH (ref 0.0–100.0)

## 2023-10-21 LAB — AMMONIA: Ammonia: 185 umol/L — ABNORMAL HIGH (ref 9–35)

## 2023-10-21 LAB — TROPONIN I (HIGH SENSITIVITY): Troponin I (High Sensitivity): 22 ng/L — ABNORMAL HIGH (ref <18)

## 2023-10-21 LAB — LACTIC ACID, PLASMA: Lactic Acid, Venous: 1.6 mmol/L (ref 0.5–1.9)

## 2023-10-21 MED ORDER — INSULIN ASPART 100 UNIT/ML IJ SOLN: 0.0000 [IU] | Freq: Every day | INTRAMUSCULAR | Status: DC

## 2023-10-21 MED ORDER — HYDRALAZINE HCL 20 MG/ML IJ SOLN
5.0000 mg | INTRAMUSCULAR | Status: DC | PRN
  Administered 2023-10-25: 5 mg via INTRAVENOUS
  Filled 2023-10-21: qty 1

## 2023-10-21 MED ORDER — NICOTINE 21 MG/24HR TD PT24
21.0000 mg | MEDICATED_PATCH | Freq: Every day | TRANSDERMAL | Status: DC
  Administered 2023-10-22 – 2023-10-25 (×4): 21 mg via TRANSDERMAL
  Filled 2023-10-21 (×5): qty 1

## 2023-10-21 MED ORDER — NICOTINE 21 MG/24HR TD PT24: 21.0000 mg | MEDICATED_PATCH | Freq: Every day | TRANSDERMAL | Status: DC

## 2023-10-21 MED ORDER — ALBUTEROL SULFATE (2.5 MG/3ML) 0.083% IN NEBU: 2.5000 mg | INHALATION_SOLUTION | RESPIRATORY_TRACT | Status: DC | PRN

## 2023-10-21 MED ORDER — DEXTROSE 50 % IV SOLN: 50.0000 mL | INTRAVENOUS | Status: DC | PRN

## 2023-10-21 MED ORDER — LACTULOSE ENEMA
300.0000 mL | Freq: Two times a day (BID) | ORAL | Status: DC
  Administered 2023-10-21 – 2023-10-23 (×4): 300 mL via RECTAL
  Filled 2023-10-21 (×5): qty 300

## 2023-10-21 MED ORDER — ACETAMINOPHEN 325 MG PO TABS
650.0000 mg | ORAL_TABLET | Freq: Four times a day (QID) | ORAL | Status: DC | PRN
  Administered 2023-10-25 (×2): 650 mg via ORAL
  Filled 2023-10-21 (×2): qty 2

## 2023-10-21 MED ORDER — SODIUM CHLORIDE 0.9 % IV SOLN: INTRAVENOUS | Status: DC

## 2023-10-21 MED ORDER — INSULIN ASPART 100 UNIT/ML IJ SOLN
0.0000 [IU] | Freq: Three times a day (TID) | INTRAMUSCULAR | Status: DC
  Filled 2023-10-21: qty 1

## 2023-10-21 MED ORDER — DM-GUAIFENESIN ER 30-600 MG PO TB12: 1.0000 | ORAL_TABLET | Freq: Two times a day (BID) | ORAL | Status: DC | PRN

## 2023-10-21 MED ORDER — LACTULOSE 10 GM/15ML PO SOLN: 30.0000 g | Freq: Once | ORAL | Status: DC

## 2023-10-21 MED ORDER — ONDANSETRON HCL 4 MG/2ML IJ SOLN: 4.0000 mg | Freq: Three times a day (TID) | INTRAMUSCULAR | Status: DC | PRN

## 2023-10-21 NOTE — Progress Notes (Signed)
 Viral illness including concern for COVID/influenza

## 2023-10-21 NOTE — ED Triage Notes (Signed)
 Pt to ED via [facility] from group home for c/o AMS. Pt answering some questions with "yes," not answering most questions.  BP 182/77 96% ra RR 16 HR 46

## 2023-10-21 NOTE — ED Notes (Signed)
 Assumed care of patient. Patient laying on stretcher with eyes open. Responds to voice. EKG completed. Patient able to answer questions slowly.

## 2023-10-21 NOTE — ED Notes (Signed)
 Flex seal placed for Lactulose administration. Patient tolerated well.

## 2023-10-21 NOTE — ED Provider Notes (Signed)
 [facility] Provider Note    Event Date/Time   First MD Initiated Contact with Patient 10/21/23 1721     (approximate)   History   Altered Mental Status   HPI  [name] is a 73 y.o. male with history of hepatitis C, cirrhosis, hypertension, hyperlipidemia, CAD, sleep apnea, pulmonary hypertension, and depression who presents from his group home with altered mental status since earlier today.  Per EMS the patient was less responsive than normal.  The patient himself is unable to give any history.  I reviewed the past medical records.  The patient was admitted earlier this month after presenting with altered mental status.  He was diagnosed with hepatic encephalopathy.   Physical Exam   Triage Vital Signs: ED Triage Vitals  Encounter Vitals Group     BP      Systolic BP Percentile      Diastolic BP Percentile      Pulse      Resp      Temp      Temp src      SpO2      Weight      Height      Head Circumference      Peak Flow      Pain Score      Pain Loc      Pain Education      Exclude from Growth Chart     Most recent vital signs: Vitals:   10/21/23 2100 10/21/23 2131  BP: (!) 182/72   Pulse: (!) 43   Resp: 13   Temp:  98.6 F (37 C)  SpO2: 100%      General: Awake, no distress.  CV:  Good peripheral perfusion.  Resp:  Normal effort.  Lungs CTAB. Abd:  No distention.  Other:  Motor intact in all extremities.  EOMI.  PERRLA.  Patient is not speaking although is following a few basic commands such as squeezing my hands.   ED Results / Procedures / Treatments   Labs (all labs ordered are listed, but only abnormal results are displayed) Labs Reviewed  AMMONIA - Abnormal; Notable for the following components:      Result Value   Ammonia 185 (*)    All other components within normal limits  BASIC METABOLIC PANEL - Abnormal; Notable for the following components:   Glucose, Bld 101 (*)    Creatinine, Ser 1.44 (*)     GFR, Estimated 51 (*)    All other components within normal limits  HEPATIC FUNCTION PANEL - Abnormal; Notable for the following components:   Total Protein 6.2 (*)    Albumin 2.6 (*)    Total Bilirubin 1.4 (*)    Bilirubin, Direct 0.3 (*)    Indirect Bilirubin 1.1 (*)    All other components within normal limits  CBC WITH DIFFERENTIAL/PLATELET - Abnormal; Notable for the following components:   WBC 2.3 (*)    RBC 3.55 (*)    Hemoglobin 11.2 (*)    HCT 31.6 (*)    Platelets 96 (*)    Neutro Abs 1.1 (*)    All other components within normal limits  PROTIME-INR - Abnormal; Notable for the following components:   Prothrombin Time 16.2 (*)    INR 1.3 (*)    All other components within normal limits  BRAIN NATRIURETIC PEPTIDE - Abnormal; Notable for the following components:   B Natriuretic Peptide 250.6 (*)    All other components  within normal limits  TROPONIN I (HIGH SENSITIVITY) - Abnormal; Notable for the following components:   Troponin I (High Sensitivity) 22 (*)    All other components within normal limits  RESP PANEL BY RT-PCR (RSV, FLU A&B, COVID)  [id number]  LACTIC ACID, PLASMA  APTT  URINALYSIS, ROUTINE W REFLEX MICROSCOPIC  BASIC METABOLIC PANEL  CBC  AMMONIA  CBG MONITORING, ED  TROPONIN I (HIGH SENSITIVITY)     EKG  ED ECG REPORT I, Dionne Bucy, the attending physician, personally viewed and interpreted this ECG.  Date: 10/21/2023 EKG Time: 1919 Rate: 47 Rhythm: Sinus bradycardia (incorrectly read by machine is junctional) QRS Axis: normal Intervals: normal ST/T Wave abnormalities: Nonspecific abnormalities Narrative Interpretation: no evidence of acute ischemia    RADIOLOGY  CT head: I independently viewed and interpreted the images; there is no ICH.  Radiology report indicates no acute abnormality.   PROCEDURES:  Critical Care performed: No  Procedures   MEDICATIONS ORDERED IN ED: Medications  nicotine (NICODERM CQ - dosed in mg/24  hours) patch 21 mg (has no administration in time range)  albuterol (PROVENTIL) (2.5 MG/3ML) 0.083% nebulizer solution 2.5 mg (has no administration in time range)  dextromethorphan-guaiFENesin (MUCINEX DM) 30-600 MG per 12 hr tablet 1 tablet (has no administration in time range)  ondansetron (ZOFRAN) injection 4 mg (has no administration in time range)  acetaminophen (TYLENOL) tablet 650 mg (has no administration in time range)  hydrALAZINE (APRESOLINE) injection 5 mg (has no administration in time range)  insulin aspart (novoLOG) injection 0-9 Units (has no administration in time range)  insulin aspart (novoLOG) injection 0-5 Units ( Subcutaneous Not Given 10/21/23 2130)  0.9 %  sodium chloride infusion ( Intravenous New Bag/Given 10/21/23 2151)  lactulose (CHRONULAC) enema 200 gm (300 mLs Rectal Given 10/21/23 2223)  dextrose 50 % solution 50 mL (has no administration in time range)     IMPRESSION / MDM / ASSESSMENT AND PLAN / ED COURSE  I reviewed the triage vital signs and the nursing notes.  73 year old male with PMH as noted above presents with altered mental status apparently noted earlier today at his group home.  The patient himself is not able to provide any history.  Per prior notes, the patient is normally alert, interactive, and able to have a conversation.  On exam, the patient is currently not speaking although is able to follow few basic commands.  Neuro exam is limited but is nonfocal.  He is borderline bradycardic, and is hypertensive other vitals are normal.  Differential diagnosis includes, but is not limited to, hepatic encephalopathy, UTI, COVID-19, influenza, other viral syndrome or acute infection, electrolyte abnormality, other metabolic disturbance, less likely primary CNS cause.  Will obtain CT head, lab workup including respiratory panel, ammonia level, and reassess.  Patient's presentation is most consistent with acute presentation with potential threat to life or  bodily function.  The patient is on the cardiac monitor to evaluate for evidence of arrhythmia and/or significant heart rate changes.  ----------------------------------------- 8:39 PM on 10/21/2023 -----------------------------------------   CT head is negative.  Ammonia level significantly elevated.  Other lab workup is unremarkable.  BMP shows no significant electrolyte abnormalities.  The patient will need admission for further management.  I have ordered a dose of lactulose.  I consulted Dr. Clyde Lundborg from the hospitalist service; based on her discussion he agrees to evaluate the patient for admission.   FINAL CLINICAL IMPRESSION(S) / ED DIAGNOSES   Final diagnoses:  Hepatic encephalopathy (HCC)  Rx / DC Orders   ED Discharge Orders     None        Note:  This document was prepared using Dragon voice recognition software and may include unintentional dictation errors.    Dionne Bucy, MD 10/21/23 2330

## 2023-10-21 NOTE — ED Notes (Signed)
 Lab called called to collect blood as patient is a difficult stick.

## 2023-10-21 NOTE — ED Notes (Signed)
 Rectal tube unclamped after lactulose instilled for 50 minutes.

## 2023-10-21 NOTE — H&P (Signed)
 History and Physical    [name] WGN:[id number] DOB: Dec 19, 1950 DOA: 10/21/2023  Referring MD/NP/PA:   PCP: [facility]   Patient coming from:  The patient is coming from group home   Chief Complaint: AMS  HPI: [name] is a 73 y.o. male with medical history significant of  depression, anxiety, hypertension, hyperlipidemia, diabetes mellitus, COPD, CAD, dCHF, TIA, migraine headache, hepatitis C, liver cirrhosis, hepatic encephalopathy, liver lesion (suspecting cancer), who presents with altered mental status.   Patient has AMS,  and is unable to provide any medical history, therefore, most of the history is obtained by discussing the case with ED physician, per EMS report, and with the nursing staff.   Per report, pt was noted to be confused since earlier today. When I saw pt in ED, he is not arousable, not following command, slightly moves extremities on painful stimuli.  No facial droop noted.  No active respiratory distress, cough, nausea, vomiting, diarrhea noted.  Not sure if patient has symptoms of UTI.  Patient does not seem to have chest pain or abdominal pain.  Data reviewed independently and ED Course: pt was found to have ammonia 185, pancytopenia with WBC 2.3, hemoglobin 11.2, platelet 96 (patient had WBC 2.8, hemoglobin 11.2, platelets 69 on 2//25), troponin 22, lactic acid 7.6.  Temperature normal, blood pressure 179/75, heart rate 43-46, 14, oxygen saturation 100% on room air.  CT of head negative.  Patient is admitted to PCU as inpatient.   EKG: I have personally reviewed.  Sinus rhythm, QTc 427, heart rate 47, early R wave progression.   Review of Systems: Could not be reviewed due to altered mental status.   Allergy:  Allergies  Allergen Reactions   Bee Venom Anaphylaxis   Codeine Itching    Only itching. Recently allergy tested at [facility] Pre-F Virus Vaccine Swelling   Meloxicam     Other Reaction(s): ?overdose of local  given at dentist--had trouble breathing   Codeine    Novocain [Procaine]    Sulfa Antibiotics Other (See Comments)   Sulfa Antibiotics     hallucinations    Past Medical History:  Diagnosis Date   (HFpEF) heart failure with preserved ejection fraction (HCC)    a.) TTE 03/20/2018: EF 60-65%, no RWMAs, mild LAE, mild-mod MR, PASP 42; b.) TTE 03/31/2021: EF 55-60%, no RWMAs, mild LVH, mild LAE, AoV sclerosis without stenosis; c.) TTE 03/25/2022: EF 60-65%, no RWMAs, AoV sclerosis without stenosis; d.) TTE 12/24/2022: EF 60-65%, no RWMAs, mild LVH, mild LAE.   Anasarca    Anemia    Anxiety    Aortic atherosclerosis (HCC)    Arthritis    Asthma    Bacteremia due to Escherichia coli 05/2023   Bacteremia due to Klebsiella pneumoniae 2023   Bilateral carotid artery disease (HCC)    Bradycardia    CAD (coronary artery disease) 10/21/2006   a.) MV 10/21/2006: small reversible defect involving the apical  lateral wall c/w possible ischemia; b.) MV: 03/24/2018: no ischemia; c.) MV 10/04/2021: LAD calcifications   CAD (coronary artery disease)    Cervical radiculopathy    Chest pain    a.) MV 03/21/2018: no ischemia; b.) MV 10/04/2021: no ischemia   Chronic back pain    Chronic common bile duct dilatation    Cirrhosis (HCC)    a.) (+) hepatic lesion on CT; likely hepatocellular carcinoma; b.) CT evidence of associated portal hypertension; c.) (+) abdominopelvic anasarca   COPD (  chronic obstructive pulmonary disease) (HCC)    DDD (degenerative disc disease), lumbar    Depression    Frequent falls    GERD (gastroesophageal reflux disease)    HBV (hepatitis B virus) infection    HCV (hepatitis C virus)    Hernia of anterior abdominal wall    History of 2019 novel coronavirus disease (COVID-19) 04/16/2020   Homelessness    a.) limited on placement as patient is listed on [place] sex offender listing for exploitation of a minor (possession of child pornography); incarcerated 12 years (released  11/2017)   Hyperlipidemia    Hypertension    Infestation by bed bug 02/2022   Malingering    Migraine    Nephrolithiasis    Obesity    Portal hypertension (HCC)    PTSD (post-traumatic stress disorder)    a.) brief training in the [employer] at age 89; someone was killed in front of him   Pulmonary HTN (HCC)    a.) multiple CT scans desmontrating dilated PA c/w pHTN; b.) TTE 03/20/2018: PASP 42 mmHg   Sleep apnea    a.) no nocturnal PAP therapy   Sleep apnea    Stenosis of cervical spine with myelopathy (HCC)    Suicidal ideations    T2DM (type 2 diabetes mellitus) (HCC)    Thrombocytopenia (HCC)    a.) related to hepatic cirrhosis; spleen normal in size   TIA (transient ischemic attack)    TIA (transient ischemic attack) 09/30/2023    Past Surgical History:  Procedure Laterality Date   ANTERIOR CERVICAL DECOMP/DISCECTOMY FUSION N/A 07/01/2023   Procedure: ANTERIOR CERVICAL DISCECTOMY AND FUSION (FORGE);  Surgeon: Venetia Night, MD;  Location: [facility] ORS;  Service: Neurosurgery;  Laterality: N/A;  Keep as last case of the day per Dr Myer Haff   ANTERIOR CERVICAL DISCECTOMY     APPENDECTOMY     CARDIAC CATHETERIZATION     CHOLECYSTECTOMY     EYE SURGERY     INNER EAR SURGERY     NOSE SURGERY     XI ROBOTIC LAPAROSCOPIC ASSISTED APPENDECTOMY N/A 11/05/2022   Procedure: XI ROBOTIC LAPAROSCOPIC ASSISTED APPENDECTOMY;  Surgeon: Carolan Shiver, MD;  Location: [facility] ORS;  Service: General;  Laterality: N/A;    Social History:  reports that he has been smoking cigarettes. He has never used smokeless tobacco. He reports that he does not currently use alcohol. He reports that he does not currently use drugs.  Family History:  Family History  Problem Relation Age of Onset   Hypertension Mother    Heart disease Mother    Heart failure Maternal Grandmother      Prior to Admission medications   Medication Sig Start Date End Date Taking? Authorizing Provider  acetaminophen  (TYLENOL) 650 MG CR tablet Take 650 mg by mouth every 8 (eight) hours as needed for pain.    [provider]  albuterol (PROVENTIL) (2.5 MG/3ML) 0.083% nebulizer solution Take 2.5 mg by nebulization every 4 (four) hours as needed for wheezing or shortness of breath. 07/16/23   [provider]  ASPIRIN 81 PO Take 81 mg by mouth daily.    [provider]  atorvastatin (LIPITOR) 40 MG tablet Take 1 tablet (40 mg total) by mouth daily. 06/21/23 06/20/24  Alford Highland, MD  atorvastatin (LIPITOR) 40 MG tablet Take 40 mg by mouth daily.    [provider]  citalopram (CELEXA) 20 MG tablet Take 1 tablet (20 mg total) by mouth daily. 06/21/23 08/27/23  Alford Highland, MD  citalopram (CELEXA) 20 MG tablet Take 20 mg by mouth daily.    [provider]  empagliflozin (JARDIANCE) 10 MG TABS tablet Take 1 tablet (10 mg total) by mouth daily before breakfast. 06/21/23   Alford Highland, MD  furosemide (LASIX) 20 MG tablet Take 1 tablet (20 mg total) by mouth daily. 06/21/23   Alford Highland, MD  furosemide (LASIX) 20 MG tablet Take 20 mg by mouth daily.    [provider]  gabapentin (NEURONTIN) 300 MG capsule Take 300 mg by mouth 2 (two) times daily. 06/07/23   [provider]  gabapentin (NEURONTIN) 300 MG capsule Take 300 mg by mouth 2 (two) times daily.    [provider]  lactulose (CHRONULAC) 10 GM/15ML solution Take 45 mLs (30 g total) by mouth 3 (three) times daily. 08/05/23   Lanae Boast, MD  lactulose (CHRONULAC) 10 GM/15ML solution Take 30 mLs (20 g total) by mouth 2 (two) times daily. 10/01/23 10/31/23  Carollee Herter, DO  loperamide (IMODIUM A-D) 2 MG tablet Take 2 mg by mouth as needed for diarrhea or loose stools. Patient not taking: Reported on 08/27/2023    [provider]  losartan (COZAAR) 50 MG tablet Take 1 tablet (50 mg total) by mouth daily. 06/21/23   Alford Highland, MD  losartan (COZAAR) 50 MG tablet Take 50  mg by mouth daily.    [provider]  methocarbamol (ROBAXIN) 500 MG tablet Take 1 tablet (500 mg total) by mouth every 6 (six) hours as needed for muscle spasms. 07/02/23   Susanne Borders, PA  Multiple Vitamin (MULTIVITAMIN WITH MINERALS) TABS tablet Take 1 tablet by mouth daily.    [provider]  Multiple Vitamins-Minerals (EYE MULTIVITAMIN PO) Take 1 tablet by mouth daily.    [provider]  nitroGLYCERIN (NITROLINGUAL) 0.4 MG/SPRAY spray Place 1 spray under the tongue every 5 (five) minutes x 3 doses as needed for chest pain.    [provider]  nitroGLYCERIN (NITROLINGUAL) 0.4 MG/SPRAY spray Place 1 spray under the tongue once as needed. If chest pain not resolved, call EMS. 07/16/23   [provider]  ondansetron (ZOFRAN) 4 MG tablet Take 1 tablet (4 mg total) by mouth every 6 (six) hours as needed for nausea. 06/21/23   Alford Highland, MD  pantoprazole (PROTONIX) 40 MG tablet Take 1 tablet (40 mg total) by mouth daily. 06/21/23 08/27/23  Alford Highland, MD  pantoprazole (PROTONIX) 40 MG tablet Take 40 mg by mouth daily. 09/24/23   [provider]  phenol (CHLORASEPTIC) 1.4 % LIQD Use as directed 1 spray in the mouth or throat as needed for throat irritation / pain.    [provider]  QUEtiapine (SEROQUEL) 25 MG tablet Take 1 tablet (25 mg total) by mouth at bedtime as needed (sleep, agitation). 10/01/23 10/31/23  Carollee Herter, DO  QUEtiapine (SEROQUEL) 50 MG tablet Take 1 tablet (50 mg total) by mouth at bedtime. 06/21/23   Alford Highland, MD  rifaximin (XIFAXAN) 550 MG TABS tablet Take 1 tablet (550 mg total) by mouth 2 (two) times daily. 06/21/23   Alford Highland, MD  senna (SENOKOT) 8.6 MG TABS tablet Take 1 tablet (8.6 mg total) by mouth 2 (two) times daily as needed for mild constipation. 07/02/23   Susanne Borders, PA  spironolactone (ALDACTONE) 25 MG tablet Take 1 tablet (25 mg total) by mouth daily. 08/27/23  11/25/23  Delma Freeze, FNP  spironolactone (ALDACTONE) 25 MG tablet Take 25 mg by  mouth daily. 09/24/23   [provider]  VENTOLIN HFA 108 (90 Base) MCG/ACT inhaler Inhale 2 puffs into the lungs every 4 (four) hours as needed for wheezing. 07/16/23   [provider]  XIFAXAN 550 MG TABS tablet Take 550 mg by mouth 2 (two) times daily. 09/24/23   [provider]    Physical Exam: Vitals:   10/21/23 1724 10/21/23 1728 10/21/23 1920  BP: (!) 173/79  (!) 179/75  Pulse: (!) 43  (!) 46  Resp: 16  14  Temp: 98.6 F (37 C)    TempSrc: Oral    SpO2: 100%  100%  Weight:  77.1 kg   Height:  5\' 5"  (1.651 m)    General: Not in acute distress HEENT:       Eyes: PERRL, EOMI, no jaundice       ENT: No discharge from the ears and nose       Neck: No JVD, no bruit, no mass felt. Heme: No neck lymph node enlargement. Cardiac: S1/S2, RRR, No murmurs, No gallops or rubs. Respiratory: No rales, wheezing, rhonchi or rubs. GI: Soft, nondistended, nontender, no organomegaly, BS present. GU: No hematuria Ext: No pitting leg edema bilaterally. 1+DP/PT pulse bilaterally. Musculoskeletal: No joint deformities, No joint redness or warmth, no limitation of ROM in spin. Skin: No rashes.  Neuro: Patient is not arousable, not following command, cranial nerves II-XII grossly intact, slightly moves extremities on painful stimuli. Psych: Patient is not psychotic, no suicidal or hemocidal ideation.  Labs on Admission: I have personally reviewed following labs and imaging studies  CBC: Recent Labs  Lab 10/21/23 1842  WBC 2.3*  NEUTROABS 1.1*  HGB 11.2*  HCT 31.6*  MCV 89.0  PLT 96*   Basic Metabolic Panel: Recent Labs  Lab 10/21/23 1842  NA 142  K 3.7  CL 111  CO2 22  GLUCOSE 101*  BUN 19  CREATININE 1.44*  CALCIUM 10.1   GFR: Estimated Creatinine Clearance: 43.1 mL/min (A) (by C-G formula based on SCr of 1.44 mg/dL (H)). Liver Function Tests: Recent Labs  Lab  10/21/23 1842  AST 41  ALT 24  ALKPHOS 75  BILITOT 1.4*  PROT 6.2*  ALBUMIN 2.6*   No results for input(s): "LIPASE", "AMYLASE" in the last 168 hours. Recent Labs  Lab 10/21/23 1842  AMMONIA 185*   Coagulation Profile: Recent Labs  Lab 10/21/23 1842  INR 1.3*   Cardiac Enzymes: No results for input(s): "CKTOTAL", "CKMB", "CKMBINDEX", "TROPONINI" in the last 168 hours. BNP (last 3 results) No results for input(s): "PROBNP" in the last 8760 hours. HbA1C: No results for input(s): "HGBA1C" in the last 72 hours. CBG: No results for input(s): "GLUCAP" in the last 168 hours. Lipid Profile: No results for input(s): "CHOL", "HDL", "LDLCALC", "TRIG", "CHOLHDL", "LDLDIRECT" in the last 72 hours. Thyroid Function Tests: No results for input(s): "TSH", "T4TOTAL", "FREET4", "T3FREE", "THYROIDAB" in the last 72 hours. Anemia Panel: No results for input(s): "VITAMINB12", "FOLATE", "FERRITIN", "TIBC", "IRON", "RETICCTPCT" in the last 72 hours. Urine analysis:    Component Value Date/Time   COLORURINE YELLOW (A) 09/30/2023 0838   APPEARANCEUR CLEAR (A) 09/30/2023 0838   LABSPEC 1.015 09/30/2023 0838   PHURINE 7.0 09/30/2023 0838   GLUCOSEU >=500 (A) 09/30/2023 0838   HGBUR NEGATIVE 09/30/2023 0838   BILIRUBINUR NEGATIVE 09/30/2023 0838   KETONESUR NEGATIVE 09/30/2023 0838   PROTEINUR NEGATIVE 09/30/2023 0838   NITRITE NEGATIVE 09/30/2023 0838   LEUKOCYTESUR NEGATIVE 09/30/2023 0838   Sepsis Labs: @  LABRCNTIP(procalcitonin:4,lacticidven:4) ) Recent Results (from the past 240 hours)  Resp panel by RT-PCR (RSV, Flu A&B, Covid) Anterior Nasal Swab     Status: None   Collection Time: 10/21/23  5:40 PM   Specimen: Anterior Nasal Swab  Result Value Ref Range Status   SARS Coronavirus 2 by RT PCR NEGATIVE NEGATIVE Final    Comment: (NOTE) SARS-CoV-2 target nucleic acids are NOT DETECTED.  The SARS-CoV-2 RNA is generally detectable in upper respiratory specimens during the acute  phase of infection. The lowest concentration of SARS-CoV-2 viral copies this assay can detect is 138 copies/mL. A negative result does not preclude SARS-Cov-2 infection and should not be used as the sole basis for treatment or other patient management decisions. A negative result may occur with  improper specimen collection/handling, submission of specimen other than nasopharyngeal swab, presence of viral mutation(s) within the areas targeted by this assay, and inadequate number of viral copies(<138 copies/mL). A negative result must be combined with clinical observations, patient history, and epidemiological information. The expected result is Negative.  Fact Sheet for Patients:  BloggerCourse.com  Fact Sheet for Healthcare Providers:  SeriousBroker.it  This test is no t yet approved or cleared by the [place] FDA and  has been authorized for detection and/or diagnosis of SARS-CoV-2 by FDA under an Emergency Use Authorization (EUA). This EUA will remain  in effect (meaning this test can be used) for the duration of the COVID-19 declaration under Section 564(b)(1) of the Act, 21 U.S.C.section 360bbb-3(b)(1), unless the authorization is terminated  or revoked sooner.       Influenza A by PCR NEGATIVE NEGATIVE Final   Influenza B by PCR NEGATIVE NEGATIVE Final    Comment: (NOTE) The Xpert Xpress SARS-CoV-2/FLU/RSV plus assay is intended as an aid in the diagnosis of influenza from Nasopharyngeal swab specimens and should not be used as a sole basis for treatment. Nasal washings and aspirates are unacceptable for Xpert Xpress SARS-CoV-2/FLU/RSV testing.  Fact Sheet for Patients: BloggerCourse.com  Fact Sheet for Healthcare Providers: SeriousBroker.it  This test is not yet approved or cleared by the [place] FDA and has been authorized for detection and/or diagnosis of  SARS-CoV-2 by FDA under an Emergency Use Authorization (EUA). This EUA will remain in effect (meaning this test can be used) for the duration of the COVID-19 declaration under Section 564(b)(1) of the Act, 21 U.S.C. section 360bbb-3(b)(1), unless the authorization is terminated or revoked.     Resp Syncytial Virus by PCR NEGATIVE NEGATIVE Final    Comment: (NOTE) Fact Sheet for Patients: BloggerCourse.com  Fact Sheet for Healthcare Providers: SeriousBroker.it  This test is not yet approved or cleared by the [place] FDA and has been authorized for detection and/or diagnosis of SARS-CoV-2 by FDA under an Emergency Use Authorization (EUA). This EUA will remain in effect (meaning this test can be used) for the duration of the COVID-19 declaration under Section 564(b)(1) of the Act, 21 U.S.C. section 360bbb-3(b)(1), unless the authorization is terminated or revoked.  Performed at [facility], [address] [place], [place] [address]      Radiological Exams on Admission:   Assessment/Plan Principal Problem:   Hepatic encephalopathy [facility]) Active Problems:   Essential hypertension   CAD (coronary artery disease)   Myocardial injury   Pancytopenia (HCC)   Liver cirrhosis (HCC)   Hyperlipidemia   COPD (chronic obstructive pulmonary disease) (HCC)   Type II diabetes mellitus with renal manifestations (HCC)   Chronic kidney disease, stage 3a (  HCC)   Chronic diastolic CHF (congestive heart failure) (HCC)   Depression   Assessment and Plan:   Hepatic encephalopathy (HCC): Patient's altered mental status is likely due to hepatic encephalopathy.  CT head negative.  Ammonia 185.  -Admitted to PCU as inpatient -Frequent neurocheck -Fall precaution -Lactulose Enema twice daily -f/u UA -Hold all oral medications until mental status improves   HTN (hypertension): -IV hydralazine as needed -hold oral Lasix,  Cozaar, spironolactone on mental status improves   Chronic diastolic CHF (congestive heart failure) (HCC): 2D echo on 12/24/2022 showed EF 60 to 65%.  No leg edema or JVD.  No oxygen desaturation.  CHF seem to be compensated. -Hold Lasix and spironolactone -Check BNP   COPD (chronic obstructive pulmonary disease) (HCC): -As needed bronchodilators   MDD (major depressive disorder): -Continue home oral medications (Celexa, hydroxyzine and Seroquel)  HLD (hyperlipidemia) -Hold Lipitor   Diabetes mellitus without complication (HCC): Recent A1c 4.9, well-controlled.  Patient is taking Jardiance and metformin -Sliding scale insulin   Pancytopenia: This is chronic issue.  Hemoglobin stable -Follow CBC   Liver cirrhosis (HCC):  -check INR/PTT -Repeat ammonia level in the morning -Hold Lasix, rifaximin and spironolactone until mental status improves   CAD (coronary artery disease) and myocardial injury: Troponin 22, no seem to have chest pain -Trend troponin -Will continue aspirin and Lipitor when mental status improves  Chronic kidney disease, stage 3a [facility]): Renal function stable.  Recent baseline creatinine 1.2-1.4.  His creatinine is 1.44, BUN 19, GFR 51 -Follow up by BMP     DVT ppx: SCD  Code Status: DNR (patient has altered mental status, cannot be discussed about CODE STATUS, but the patient has MOST form of DNR in epic which was signed on 08/02/2023, I ordered DNR).   Family Communication:    not done, no family member is at bed side.       Disposition Plan:  Anticipate discharge back to previous environment, group home  Consults called: none  Admission status and Level of care: Progressive:   as inpt        Dispo: The patient is from: Group home              Anticipated d/c is to: Group home              Anticipated d/c date is: 2 days              Patient currently is not medically stable to d/c.    Severity of Illness:  The appropriate patient status for  this patient is INPATIENT. Inpatient status is judged to be reasonable and necessary in order to provide the required intensity of service to ensure the patient's safety. The patient's presenting symptoms, physical exam findings, and initial radiographic and laboratory data in the context of their chronic comorbidities is felt to place them at high risk for further clinical deterioration. Furthermore, it is not anticipated that the patient will be medically stable for discharge from the hospital within 2 midnights of admission.   * I certify that at the point of admission it is my clinical judgment that the patient will require inpatient hospital care spanning beyond 2 midnights from the point of admission due to high intensity of service, high risk for further deterioration and high frequency of surveillance required.*       Date of Service 10/21/2023    Lorretta Harp [facility]   If 7PM-7AM, please contact night-coverage [email or web address] 10/21/2023, 8:53 PM

## 2023-10-22 ENCOUNTER — Encounter: Payer: 59 | Admitting: Neurosurgery

## 2023-10-22 ENCOUNTER — Encounter: Payer: Self-pay | Admitting: Internal Medicine

## 2023-10-22 DIAGNOSIS — I10 Essential (primary) hypertension: Secondary | ICD-10-CM | POA: Diagnosis not present

## 2023-10-22 DIAGNOSIS — K7682 Hepatic encephalopathy: Secondary | ICD-10-CM | POA: Diagnosis not present

## 2023-10-22 DIAGNOSIS — K7460 Unspecified cirrhosis of liver: Secondary | ICD-10-CM

## 2023-10-22 DIAGNOSIS — E782 Mixed hyperlipidemia: Secondary | ICD-10-CM

## 2023-10-22 LAB — BASIC METABOLIC PANEL
Anion gap: 7 (ref 5–15)
BUN: 19 mg/dL (ref 8–23)
CO2: 20 mmol/L — ABNORMAL LOW (ref 22–32)
Calcium: 9.5 mg/dL (ref 8.9–10.3)
Chloride: 115 mmol/L — ABNORMAL HIGH (ref 98–111)
Creatinine, Ser: 1.26 mg/dL — ABNORMAL HIGH (ref 0.61–1.24)
GFR, Estimated: 60 mL/min — ABNORMAL LOW (ref >60)
Glucose, Bld: 88 mg/dL (ref 70–99)
Potassium: 3.4 mmol/L — ABNORMAL LOW (ref 3.5–5.1)
Sodium: 142 mmol/L (ref 135–145)

## 2023-10-22 LAB — GLUCOSE, CAPILLARY
Glucose-Capillary: 75 mg/dL (ref 70–99)
Glucose-Capillary: 109 mg/dL — ABNORMAL HIGH (ref 70–99)
Glucose-Capillary: 104 mg/dL — ABNORMAL HIGH (ref 70–99)

## 2023-10-22 LAB — AMMONIA: Ammonia: 126 umol/L — ABNORMAL HIGH (ref 9–35)

## 2023-10-22 LAB — CBC
HCT: 29.9 % — ABNORMAL LOW (ref 39.0–52.0)
Hemoglobin: 10.3 g/dL — ABNORMAL LOW (ref 13.0–17.0)
MCH: 30.7 pg (ref 26.0–34.0)
MCHC: 34.4 g/dL (ref 30.0–36.0)
MCV: 89.3 fL (ref 80.0–100.0)
Platelets: 92 10*3/uL — ABNORMAL LOW (ref 150–400)
RBC: 3.35 MIL/uL — ABNORMAL LOW (ref 4.22–5.81)
RDW: 14.8 % (ref 11.5–15.5)
WBC: 2.6 10*3/uL — ABNORMAL LOW (ref 4.0–10.5)
nRBC: 0 % (ref 0.0–0.2)

## 2023-10-22 LAB — URINALYSIS, ROUTINE W REFLEX MICROSCOPIC
Bilirubin Urine: NEGATIVE
Glucose, UA: NEGATIVE mg/dL
Hgb urine dipstick: NEGATIVE
Ketones, ur: NEGATIVE mg/dL
Leukocytes,Ua: NEGATIVE
Nitrite: NEGATIVE
Protein, ur: NEGATIVE mg/dL
Specific Gravity, Urine: 1.016 (ref 1.005–1.030)
pH: 7 (ref 5.0–8.0)

## 2023-10-22 LAB — CBG MONITORING, ED
Glucose-Capillary: 87 mg/dL (ref 70–99)
Glucose-Capillary: 79 mg/dL (ref 70–99)

## 2023-10-22 LAB — TROPONIN I (HIGH SENSITIVITY)
Troponin I (High Sensitivity): 26 ng/L — ABNORMAL HIGH (ref <18)
Troponin I (High Sensitivity): 27 ng/L — ABNORMAL HIGH (ref <18)
Troponin I (High Sensitivity): 27 ng/L — ABNORMAL HIGH (ref <18)

## 2023-10-22 MED ORDER — KCL IN DEXTROSE-NACL 10-5-0.45 MEQ/L-%-% IV SOLN
INTRAVENOUS | Status: DC
  Filled 2023-10-22 (×2): qty 1000

## 2023-10-22 MED ORDER — HALOPERIDOL LACTATE 5 MG/ML IJ SOLN
5.0000 mg | Freq: Four times a day (QID) | INTRAMUSCULAR | Status: DC | PRN
  Administered 2023-10-22 – 2023-10-23 (×2): 5 mg via INTRAVENOUS
  Filled 2023-10-22 (×2): qty 1

## 2023-10-22 NOTE — ED Notes (Signed)
 Patient with stool in flex seal bag and stooled around tube. Cleansed of stool. Patient tracking more, making more noises and groans, says "ow". Patient not responding to questions with speech, but appears more alert, warmer to touch.

## 2023-10-22 NOTE — Progress Notes (Signed)
 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 coli bacteremia in October 2024, history of Klebsiella pneumonia bacteremia, chronic back pain status post discectomy, who presents to the emergency department from group home facility for chief concerns of altered mental status. Recent admission 09/30/23 -10/01/23 for hepatic encephalopathy.  Ammonia noted to be 185, CBC with pancytopenia, has baseline bradycardia, CT head unremarkable admitted to hospitalist service for hepatic encephalopathy.  Assessment and Plan: Hepatic encephalopathy: Presented with altered mental status, ammonia 185. Today he is able to answer, but very sleepy and lethargic.  Ammonia 125. Continue frequent neurochecks. Continue lactulose enema twice daily. UA unremarkable.  Type 2 diabetes mellitus Hypoglycemia: Blood sugars lower side.  He is eating poor. Will give D5 half NS with KCl supplementation. Accu-Cheks, sliding scale insulin as per floor protocol. Continue hypoglycemia protocol.  Chronic diastolic heart failure: No fluid overload. Continue to hold Lasix and spironolactone.  COPD: Continue bronchodilators and home inhalers. Discussed with pharmacy, started him on Anoro.  Hypertension: CAD Patient will be continued on aspirin, statin, home antihypertensive regimen if able to tolerate oral diet. Continue to monitor blood pressure closely. IV hydralazine as needed ordered.  Chronic pancytopenia: Due to his liver disorder. Trend CBC.  Liver cirrhosis: Patient will be started on rifaximin Lasix and spironolactone once he is able to tolerate  p.o.  CKD stage IIIa: Kidney function stable. Continue to monitor daily renal function. Avoid nephrotoxic drugs.    Out of bed to chair. Incentive spirometry. Nursing supportive care. Fall, aspiration precautions. DVT prophylaxis   Code Status: Limited: Do not attempt resuscitation (DNR) -DNR-LIMITED -Do Not Intubate/DNI   Subjective: Patient is seen and examined today morning in the emergency department.  He is able to tell his name.  Has slow mentation.  No family at bedside.  Physical Exam: Vitals:   10/22/23 0800 10/22/23 0848 10/22/23 1003 10/22/23 1035  BP: (!) 174/81  (!) 171/93 (!) 168/71  Pulse: (!) 45  (!) 51 93  Resp: 17  15 18   Temp:  98.5 F (36.9 C)    TempSrc:  Oral Oral   SpO2: 100%  100% 100%  Weight:   84.4 kg   Height:   5\' 6"  (1.676 m)     General - Elderly Caucasian ill male, no apparent distress HEENT - PERRLA, EOMI, atraumatic head, non tender sinuses. Lung - Clear, basal rales, diffuse rhonchi noted Heart - S1, S2 heard, no murmurs, rubs, trace pedal edema. Abdomen - Soft, non tender, bowel sounds good Neuro - Alert, awake slow mentation, unable to follow commands, non focal exam. Skin - Warm and dry.  Data Reviewed:      Latest Ref Rng & Units 10/22/2023    5:07 AM 10/21/2023    6:42 PM 10/01/2023    5:30 AM  CBC  WBC 4.0 - 10.5 K/uL 2.6  2.3  2.8   Hemoglobin 13.0 - 17.0 g/dL 81.1  91.4  78.2   Hematocrit 39.0 - 52.0 % 29.9  31.6  32.9   Platelets 150 - 400 K/uL 92  96  69       Latest Ref Rng & Units 10/22/2023  5:07 AM 10/21/2023    6:42 PM 10/01/2023    5:30 AM  BMP  Glucose 70 - 99 mg/dL 88  147  80   BUN 8 - 23 mg/dL 19  19  21    Creatinine 0.61 - 1.24 mg/dL 8.29  5.62  1.30   Sodium 135 - 145 mmol/L 142  142  138   Potassium 3.5 - 5.1 mmol/L 3.4  3.7  3.8   Chloride 98 - 111 mmol/L 115  111  110   CO2 22 - 32 mmol/L 20  22  20    Calcium 8.9 - 10.3 mg/dL 9.5  86.5  9.5    CT Head Wo Contrast Result Date: 10/21/2023 CLINICAL  DATA:  Mental status change, unknown cause. EXAM: CT HEAD WITHOUT CONTRAST TECHNIQUE: Contiguous axial images were obtained from the base of the skull through the vertex without intravenous contrast. RADIATION DOSE REDUCTION: This exam was performed according to the departmental dose-optimization program which includes automated exposure control, adjustment of the mA and/or kV according to patient size and/or use of iterative reconstruction technique. COMPARISON:  CT head without contrast 09/30/2023 FINDINGS: Brain: No acute infarct, hemorrhage, or mass lesion is present. Mild atrophy and white matter changes are similar the prior exam. The ventricles are of normal size. No significant extraaxial fluid collection is present. The brainstem and cerebellum are within normal limits. Midline structures are within normal limits. Vascular: No hyperdense vessel or unexpected calcification. Skull: Calvarium is intact. No focal lytic or blastic lesions are present. No significant extracranial soft tissue lesion is present. Sinuses/Orbits: The paranasal sinuses and mastoid air cells are clear. The globes and orbits are within normal limits. IMPRESSION: 1. No acute intracranial abnormality or significant interval change. 2. Mild atrophy and white matter disease likely reflects the sequela of chronic microvascular ischemia. Electronically Signed   By: Marin Roberts M.D.   On: 10/21/2023 19:40   Family Communication: Daughter's listed phone number is wrong. Discussed with Kieth Brightly from [facility] office listed as relative would not take any medical decisions for him.   Disposition: Status is: Inpatient Remains inpatient appropriate because: hepatic encephalopathy, poor oral intake.  Planned Discharge Destination:  group home     Time spent: 34 minutes  Author: Marcelino Duster, MD 10/22/2023 12:42 PM Secure chat 7am to 7pm For on call review [email or web address].

## 2023-10-23 DIAGNOSIS — K7682 Hepatic encephalopathy: Secondary | ICD-10-CM | POA: Diagnosis not present

## 2023-10-23 LAB — CBC WITH DIFFERENTIAL/PLATELET
Abs Immature Granulocytes: 0.01 10*3/uL (ref 0.00–0.07)
Basophils Absolute: 0 10*3/uL (ref 0.0–0.1)
Basophils Relative: 1 %
Eosinophils Absolute: 0 10*3/uL (ref 0.0–0.5)
Eosinophils Relative: 0 %
HCT: 31.2 % — ABNORMAL LOW (ref 39.0–52.0)
Hemoglobin: 10.7 g/dL — ABNORMAL LOW (ref 13.0–17.0)
Immature Granulocytes: 0 %
Lymphocytes Relative: 19 %
Lymphs Abs: 0.6 10*3/uL — ABNORMAL LOW (ref 0.7–4.0)
MCH: 31.3 pg (ref 26.0–34.0)
MCHC: 34.3 g/dL (ref 30.0–36.0)
MCV: 91.2 fL (ref 80.0–100.0)
Monocytes Absolute: 0.3 10*3/uL (ref 0.1–1.0)
Monocytes Relative: 9 %
Neutro Abs: 2.4 10*3/uL (ref 1.7–7.7)
Neutrophils Relative %: 71 %
Platelets: 85 10*3/uL — ABNORMAL LOW (ref 150–400)
RBC: 3.42 MIL/uL — ABNORMAL LOW (ref 4.22–5.81)
RDW: 14.8 % (ref 11.5–15.5)
WBC: 3.4 10*3/uL — ABNORMAL LOW (ref 4.0–10.5)
nRBC: 0 % (ref 0.0–0.2)

## 2023-10-23 LAB — COMPREHENSIVE METABOLIC PANEL
ALT: 25 U/L (ref 0–44)
AST: 47 U/L — ABNORMAL HIGH (ref 15–41)
Albumin: 2.7 g/dL — ABNORMAL LOW (ref 3.5–5.0)
Alkaline Phosphatase: 55 U/L (ref 38–126)
Anion gap: 7 (ref 5–15)
BUN: 17 mg/dL (ref 8–23)
CO2: 20 mmol/L — ABNORMAL LOW (ref 22–32)
Calcium: 9.6 mg/dL (ref 8.9–10.3)
Chloride: 115 mmol/L — ABNORMAL HIGH (ref 98–111)
Creatinine, Ser: 1.02 mg/dL (ref 0.61–1.24)
GFR, Estimated: >60 mL/min (ref >60)
Glucose, Bld: 109 mg/dL — ABNORMAL HIGH (ref 70–99)
Potassium: 3.3 mmol/L — ABNORMAL LOW (ref 3.5–5.1)
Sodium: 142 mmol/L (ref 135–145)
Total Bilirubin: 1.8 mg/dL — ABNORMAL HIGH (ref 0.0–1.2)
Total Protein: 6.5 g/dL (ref 6.5–8.1)

## 2023-10-23 LAB — GLUCOSE, CAPILLARY
Glucose-Capillary: 118 mg/dL — ABNORMAL HIGH (ref 70–99)
Glucose-Capillary: 112 mg/dL — ABNORMAL HIGH (ref 70–99)
Glucose-Capillary: 86 mg/dL (ref 70–99)
Glucose-Capillary: 90 mg/dL (ref 70–99)

## 2023-10-23 MED ORDER — LACTULOSE 10 GM/15ML PO SOLN
30.0000 g | Freq: Three times a day (TID) | ORAL | Status: DC
  Administered 2023-10-23 – 2023-10-26 (×9): 30 g via ORAL
  Filled 2023-10-23 (×9): qty 60

## 2023-10-23 MED ORDER — CITALOPRAM HYDROBROMIDE 20 MG PO TABS
20.0000 mg | ORAL_TABLET | Freq: Every day | ORAL | Status: DC
  Administered 2023-10-23 – 2023-10-26 (×4): 20 mg via ORAL
  Filled 2023-10-23 (×4): qty 1

## 2023-10-23 MED ORDER — RIFAXIMIN 550 MG PO TABS
550.0000 mg | ORAL_TABLET | Freq: Two times a day (BID) | ORAL | Status: DC
  Administered 2023-10-23 – 2023-10-26 (×7): 550 mg via ORAL
  Filled 2023-10-23 (×7): qty 1

## 2023-10-23 MED ORDER — QUETIAPINE FUMARATE 25 MG PO TABS: 25.0000 mg | ORAL_TABLET | Freq: Every evening | ORAL | Status: DC | PRN

## 2023-10-23 MED ORDER — POTASSIUM CHLORIDE CRYS ER 20 MEQ PO TBCR
40.0000 meq | EXTENDED_RELEASE_TABLET | Freq: Once | ORAL | Status: AC
  Administered 2023-10-23: 40 meq via ORAL
  Filled 2023-10-23: qty 2

## 2023-10-23 MED ORDER — LOSARTAN POTASSIUM 50 MG PO TABS
50.0000 mg | ORAL_TABLET | Freq: Every day | ORAL | Status: DC
  Administered 2023-10-23 – 2023-10-26 (×4): 50 mg via ORAL
  Filled 2023-10-23 (×4): qty 1

## 2023-10-23 MED ORDER — SPIRONOLACTONE 25 MG PO TABS
25.0000 mg | ORAL_TABLET | Freq: Every day | ORAL | Status: DC
  Administered 2023-10-23 – 2023-10-26 (×4): 25 mg via ORAL
  Filled 2023-10-23 (×4): qty 1

## 2023-10-23 NOTE — Progress Notes (Signed)
 Progress Note   Patient: [name] QIO:[id number] DOB: [birth year] DOA: 10/21/2023     2 DOS: the patient was seen and examined on 10/23/2023   Brief hospital course: 73yo with h/o depression/anxiety, HTN, DM, COPD, CAD, chronic diastolic CHF, and 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  He was started on lactulose retention enemas.  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 Presented with acute onset of AMS, thought to be related to hepatic encephalopathy Ammonia  185 on admission, down to 126 at last check He appears to be somewhat improved Has rectal tube and getting PR lactulose; will change to PO and titrate to 2-3 soft stools/day It is important to maintain dietary protein intake of 1.2-1.5g/kg/day; will request nutrition consult    Cirrhosis MELD 3.0 score is 14 Continue spironolactone, Rifaximin   HTN  Resume losartan, spironolactone IV hydralazine as needed   Chronic diastolic CHF 2D echo on 12/24/2022 showed EF 60 to 65% Appears to be compensated. Hold Lasix    COPD (chronic obstructive pulmonary disease) As needed bronchodilators   MDD (major depressive disorder) Continue citalopram and Seroquel   HLD (hyperlipidemia) Hold Lipitor   Diabetes mellitus without complication  Recent A1c 4.9, well-controlled No longer on home medications Cover with sensitive-scale SSI   Pancytopenia Chronic issue Hemoglobin stable   CAD (coronary artery disease) Troponin 22, 26, 27 - negative delta No chest pain Likely demand ischemia   Chronic kidney disease, stage 3a  Recent baseline creatinine 1.2-1.4 Appears to be stable Avoid nephrotoxic medications where possible  DNR Confirmed on admission [facility] documents reviewed    Consultants: Nutrition  Procedures: None  Antibiotics: None   30  Day Unplanned Readmission Risk Score    Flowsheet Row ED to Hosp-Admission (Current) from 10/21/2023 in [facility] 1C MEDICAL TELEMETRY  30 Day Unplanned Readmission Risk Score (%) 50.72 Filed at 10/23/2023 0401       This score is the patient's risk of an unplanned readmission within 30 days of being discharged (0 -100%). The score is based on dignosis, age, lab data, medications, orders, and past utilization.   Low:  0-14.9   Medium: 15-21.9   High: 22-29.9   Extreme: 30 and above           Subjective: Alert, not fully able to have a conversation.  Mittens in place with telesitter.  Rectal tube in place.   Objective: Vitals:   10/23/23 0844 10/23/23 1208  BP: (!) 158/71 (!) 170/70  Pulse: (!) 53 (!) 57  Resp: 20 18  Temp: 97.8 F (36.6 C) 97.8 F (36.6 C)  SpO2: 99% 100%    Intake/Output Summary (Last 24 hours) at 10/23/2023 1527 Last data filed at 10/23/2023 0900 Gross per 24 hour  Intake 2100.82 ml  Output --  Net 2100.82 ml   Filed Weights   10/21/23 1728 10/22/23 1003 10/23/23 0441  Weight: 77.1 kg 84.4 kg 82.5 kg    Exam:  General:  Appears calm and comfortable and is in NAD, mildly confused Eyes:  EOMI, normal lids, iris ENT:  grossly normal hearing, lips & tongue, mmm Neck:  no LAD, masses or thyromegaly Cardiovascular:  RRR, no m/r/g. No LE edema.  Respiratory:   CTA bilaterally with no wheezes/rales/rhonchi.  Normal respiratory effort. Abdomen:  soft, NT, ND Skin:  no rash or induration  seen on limited exam Musculoskeletal:  grossly normal tone BUE/BLE, good ROM, no bony abnormality Psychiatric:  mildly confused mood and affect, speech dysarthric and only mildly appropriate Neurologic:  CN 2-12 grossly intact, moves all extremities in coordinated fashion  Data Reviewed: I have reviewed the patient's lab results since admission.  Pertinent labs for today include:   K+ 3.3 CO2 20 Glucose 109 Albumin 2.7 AST 47/ALT 25/Bili  1.8 WBC 3.4 Hgb 10.7 Platelets 85     Family Communication: None present; I called his emergency contact - Kieth Brightly is his registered sexual offender registry contact and so was moved to the bottom of the list.  I then attempted to call his daughter and this was the wrong number.  Disposition: Status is: Inpatient Remains inpatient appropriate because: remains encephalopathic     Time spent: 50 minutes  Unresulted Labs (From admission, onward)     Start     Ordered   10/24/23 0500  Basic metabolic panel  Tomorrow morning,   R        10/23/23 1527   10/24/23 0500  CBC with Differential/Platelet  Tomorrow morning,   R        10/23/23 1527             Author: Jonah Blue, MD 10/23/2023 3:27 PM  For on call review [email or web address].

## 2023-10-23 NOTE — Progress Notes (Signed)
 Patient is alert and oriented X 1. He has been agitated, and try to get out of bed frequently. Rectal tube placed at 8:am and gave lactulose enema. Rectal tube removed by pt at 15.gave prn medicine for agitation. He also removed I/v line. Got  new I/v line.  Safety mitts placed on both hands to save I/v line.Tele sitter at bedside.

## 2023-10-23 NOTE — Hospital Course (Signed)
 72yo with h/o depression/anxiety, HTN, DM, COPD, CAD, chronic diastolic CHF, and 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  He was started on lactulose retention enemas.

## 2023-10-23 NOTE — Plan of Care (Signed)
  Problem: Fluid Volume: Goal: Ability to maintain a balanced intake and output will improve Outcome: Progressing   Problem: Health Behavior/Discharge Planning: Goal: Ability to identify and utilize available resources and services will improve Outcome: Progressing Goal: Ability to manage health-related needs will improve Outcome: Progressing   Problem: Metabolic: Goal: Ability to maintain appropriate glucose levels will improve Outcome: Progressing   Problem: Skin Integrity: Goal: Risk for impaired skin integrity will decrease Outcome: Progressing   Problem: Tissue Perfusion: Goal: Adequacy of tissue perfusion will improve Outcome: Progressing   Problem: Clinical Measurements: Goal: Will remain free from infection Outcome: Progressing Goal: Respiratory complications will improve Outcome: Progressing Goal: Cardiovascular complication will be avoided Outcome: Progressing   Problem: Activity: Goal: Risk for activity intolerance will decrease Outcome: Progressing   Problem: Nutrition: Goal: Adequate nutrition will be maintained Outcome: Progressing   Problem: Pain Managment: Goal: General experience of comfort will improve and/or be controlled Outcome: Progressing   Problem: Safety: Goal: Ability to remain free from injury will improve Outcome: Progressing   Problem: Skin Integrity: Goal: Risk for impaired skin integrity will decrease Outcome: Progressing

## 2023-10-23 NOTE — Evaluation (Addendum)
 Clinical/Bedside Swallow Evaluation Patient Details  Name: [name] MRN: [id number] Date of Birth: July 20, 1951  Today's Date: 10/23/2023 Time: SLP Start Time (ACUTE ONLY): 4098 SLP Stop Time (ACUTE ONLY): 0810 SLP Time Calculation (min) (ACUTE ONLY): 12 min  Past Medical History:  Past Medical History:  Diagnosis Date   (HFpEF) heart failure with preserved ejection fraction (HCC)    a.) TTE 03/20/2018: EF 60-65%, no RWMAs, mild LAE, mild-mod MR, PASP 42; b.) TTE 03/31/2021: EF 55-60%, no RWMAs, mild LVH, mild LAE, AoV sclerosis without stenosis; c.) TTE 03/25/2022: EF 60-65%, no RWMAs, AoV sclerosis without stenosis; d.) TTE 12/24/2022: EF 60-65%, no RWMAs, mild LVH, mild LAE.   Anasarca    Anemia    Anxiety    Aortic atherosclerosis (HCC)    Arthritis    Asthma    Bacteremia due to Escherichia coli 05/2023   Bacteremia due to Klebsiella pneumoniae 2023   Bilateral carotid artery disease (HCC)    Bradycardia    CAD (coronary artery disease) 10/21/2006   a.) MV 10/21/2006: small reversible defect involving the apical  lateral wall c/w possible ischemia; b.) MV: 03/24/2018: no ischemia; c.) MV 10/04/2021: LAD calcifications   CAD (coronary artery disease)    Cervical radiculopathy    Chest pain    a.) MV 03/21/2018: no ischemia; b.) MV 10/04/2021: no ischemia   Chronic back pain    Chronic common bile duct dilatation    Cirrhosis (HCC)    a.) (+) hepatic lesion on CT; likely hepatocellular carcinoma; b.) CT evidence of associated portal hypertension; c.) (+) abdominopelvic anasarca   COPD (chronic obstructive pulmonary disease) (HCC)    DDD (degenerative disc disease), lumbar    Depression    Frequent falls    GERD (gastroesophageal reflux disease)    HBV (hepatitis B virus) infection    HCV (hepatitis C virus)    Hernia of anterior abdominal wall    History of 2019 novel coronavirus disease (COVID-19) 04/16/2020   Homelessness    a.) limited on placement as patient is  listed on [place] sex offender listing for exploitation of a minor (possession of child pornography); incarcerated 12 years (released 11/2017)   Hyperlipidemia    Hypertension    Infestation by bed bug 02/2022   Malingering    Migraine    Nephrolithiasis    Obesity    Portal hypertension (HCC)    PTSD (post-traumatic stress disorder)    a.) brief training in the [employer] at age 42; someone was killed in front of him   Pulmonary HTN (HCC)    a.) multiple CT scans desmontrating dilated PA c/w pHTN; b.) TTE 03/20/2018: PASP 42 mmHg   Sleep apnea    a.) no nocturnal PAP therapy   Sleep apnea    Stenosis of cervical spine with myelopathy (HCC)    Suicidal ideations    T2DM (type 2 diabetes mellitus) (HCC)    Thrombocytopenia (HCC)    a.) related to hepatic cirrhosis; spleen normal in size   TIA (transient ischemic attack)    TIA (transient ischemic attack) 09/30/2023   Past Surgical History:  Past Surgical History:  Procedure Laterality Date   ANTERIOR CERVICAL DECOMP/DISCECTOMY FUSION N/A 07/01/2023   Procedure: ANTERIOR CERVICAL DISCECTOMY AND FUSION (FORGE);  Surgeon: Venetia Night, MD;  Location: [facility] ORS;  Service: Neurosurgery;  Laterality: N/A;  Keep as last case of the day per Dr Myer Haff   ANTERIOR CERVICAL DISCECTOMY     APPENDECTOMY     CARDIAC  CATHETERIZATION     CHOLECYSTECTOMY     EYE SURGERY     INNER EAR SURGERY     NOSE SURGERY     XI ROBOTIC LAPAROSCOPIC ASSISTED APPENDECTOMY N/A 11/05/2022   Procedure: XI ROBOTIC LAPAROSCOPIC ASSISTED APPENDECTOMY;  Surgeon: Carolan Shiver, MD;  Location: [facility] ORS;  Service: General;  Laterality: N/A;   HPI:  Pt is a 73 y.o. male who presented to ED with AMS. Pt with medical history significant of  depression, anxiety, hypertension, hyperlipidemia, diabetes mellitus, COPD, CAD, dCHF, TIA, migraine headache, hepatitis C, liver cirrhosis, hepatic encephalopathy, and liver lesion (suspecting cancer). Head CT on admit, "1. No  acute intracranial abnormality or significant interval change. 2. Mild atrophy and white matter disease likely reflects the sequela of chronic microvascular ischemia."   Assessment / Plan / Recommendation  Clinical Impression  Pt seen for clinical swallowing evaluation. Pt alert, notably confused with B mitts donned for duration of evaluation. Oriented to self only. Attempting to bite mitts. Unable to follow commands for OME. Noted edentulism and xerostomia. Evaluation limited due to AMS. Based on today's assessment, pt presents with s/sx mild-moderate oral dysphagia c/b prolonged/inefficient mastication of solids. Oral deficits likely secondary to dental status and exacerbated by mental status. Recommend continuation of a Dysphagia 2 Diet with Thin Liquids with safe swallowing stratiegies/aspiration precautions as outlined below. SLP to f/u per POC for diet tolerance and trials of upgraded solids pending improvements in mental status.  SLP Visit Diagnosis: Dysphagia, oral phase (R13.11)    Aspiration Risk  Mild aspiration risk    Diet Recommendation Dysphagia 2 (Fine chop);Thin liquid    Medication Administration:  (whole vs crushed in puree) Supervision: Full supervision/cueing for compensatory strategies;Staff to assist with self feeding Compensations: Minimize environmental distractions;Slow rate;Small sips/bites Postural Changes: Seated upright at 90 degrees;Remain upright for at least 30 minutes after po intake    Other  Recommendations Oral Care Recommendations: Oral care QID    Recommendations for follow up therapy are one component of a multi-disciplinary discharge planning process, led by the attending physician.  Recommendations may be updated based on patient status, additional functional criteria and insurance authorization.  Follow up Recommendations  (likely no f/u ST for swallowing)         Functional Status Assessment Patient has had a recent decline in their functional  status and demonstrates the ability to make significant improvements in function in a reasonable and predictable amount of time.  Frequency and Duration min 2x/week  2 weeks       Prognosis Prognosis for improved oropharyngeal function: Fair Barriers to Reach Goals: Severity of deficits (AMS)      Swallow Study   General Date of Onset: 10/21/23 HPI: Pt is a 73 y.o. male who presented to ED with AMS. Pt with medical history significant of  depression, anxiety, hypertension, hyperlipidemia, diabetes mellitus, COPD, CAD, dCHF, TIA, migraine headache, hepatitis C, liver cirrhosis, hepatic encephalopathy, and liver lesion (suspecting cancer). Type of Study: Bedside Swallow Evaluation Previous Swallow Assessment: none Diet Prior to this Study: Dysphagia 2 (finely chopped);Thin liquids (Level 0) Temperature Spikes Noted: No Respiratory Status: Room air History of Recent Intubation: No Behavior/Cognition: Alert;Distractible;Requires cueing;Doesn't follow directions Oral Cavity Assessment: Dry Oral Care Completed by SLP: Yes Oral Cavity - Dentition: Edentulous Vision: Functional for self-feeding Self-Feeding Abilities:  (did not attempt; B mitts donned) Patient Positioning: Upright in bed Baseline Vocal Quality: Normal Volitional Cough: Cognitively unable to elicit Volitional Swallow: Unable to elicit    Oral/Motor/Sensory  Function Overall Oral Motor/Sensory Function:  (no obvious functional deficits; unable to complete OME due to AMS)   Ice Chips Ice chips: Within functional limits Presentation: Spoon   Thin Liquid Thin Liquid: Within functional limits Presentation: Straw    Nectar Thick Nectar Thick Liquid: Not tested   Honey Thick Honey Thick Liquid: Not tested   Puree Puree: Within functional limits Presentation: Spoon   Solid     Solid: Impaired Oral Phase Impairments: Impaired mastication Oral Phase Functional Implications: Impaired mastication;Prolonged oral  transit Pharyngeal Phase Impairments:  (WFL)     Clyde Canterbury, M.S., CCC-SLP Speech-Language Pathologist [facility] [phone] (ASCOM)  Alessandra Bevels Tanna Loeffler 10/23/2023,8:23 AM

## 2023-10-23 NOTE — Progress Notes (Signed)
 Introduced patient to role of Statistician. Intake questions deferred for now r/t orientation.

## 2023-10-23 NOTE — Plan of Care (Signed)

## 2023-10-24 ENCOUNTER — Encounter: Payer: 59 | Admitting: Family

## 2023-10-24 ENCOUNTER — Telehealth: Payer: Self-pay | Admitting: Neurosurgery

## 2023-10-24 DIAGNOSIS — K7682 Hepatic encephalopathy: Secondary | ICD-10-CM | POA: Diagnosis not present

## 2023-10-24 LAB — COMPREHENSIVE METABOLIC PANEL
ALT: 29 U/L (ref 0–44)
AST: 50 U/L — ABNORMAL HIGH (ref 15–41)
Albumin: 2.7 g/dL — ABNORMAL LOW (ref 3.5–5.0)
Alkaline Phosphatase: 55 U/L (ref 38–126)
Anion gap: 7 (ref 5–15)
BUN: 14 mg/dL (ref 8–23)
CO2: 20 mmol/L — ABNORMAL LOW (ref 22–32)
Calcium: 9.6 mg/dL (ref 8.9–10.3)
Chloride: 112 mmol/L — ABNORMAL HIGH (ref 98–111)
Creatinine, Ser: 0.95 mg/dL (ref 0.61–1.24)
GFR, Estimated: >60 mL/min (ref >60)
Glucose, Bld: 89 mg/dL (ref 70–99)
Potassium: 3.5 mmol/L (ref 3.5–5.1)
Sodium: 139 mmol/L (ref 135–145)
Total Bilirubin: 2.3 mg/dL — ABNORMAL HIGH (ref 0.0–1.2)
Total Protein: 6.5 g/dL (ref 6.5–8.1)

## 2023-10-24 LAB — BASIC METABOLIC PANEL WITH GFR
Anion gap: 8 (ref 5–15)
BUN: 14 mg/dL (ref 8–23)
CO2: 18 mmol/L — ABNORMAL LOW (ref 22–32)
Calcium: 9.5 mg/dL (ref 8.9–10.3)
Chloride: 111 mmol/L (ref 98–111)
Creatinine, Ser: 0.99 mg/dL (ref 0.61–1.24)
GFR, Estimated: >60 mL/min (ref >60)
Glucose, Bld: 82 mg/dL (ref 70–99)
Potassium: 3.5 mmol/L (ref 3.5–5.1)
Sodium: 137 mmol/L (ref 135–145)

## 2023-10-24 LAB — GLUCOSE, CAPILLARY
Glucose-Capillary: 82 mg/dL (ref 70–99)
Glucose-Capillary: 111 mg/dL — ABNORMAL HIGH (ref 70–99)
Glucose-Capillary: 81 mg/dL (ref 70–99)
Glucose-Capillary: 98 mg/dL (ref 70–99)

## 2023-10-24 LAB — CBC WITH DIFFERENTIAL/PLATELET
Abs Immature Granulocytes: 0.02 10*3/uL (ref 0.00–0.07)
Basophils Absolute: 0 10*3/uL (ref 0.0–0.1)
Basophils Relative: 1 %
Eosinophils Absolute: 0 10*3/uL (ref 0.0–0.5)
Eosinophils Relative: 1 %
HCT: 28.5 % — ABNORMAL LOW (ref 39.0–52.0)
Hemoglobin: 10.1 g/dL — ABNORMAL LOW (ref 13.0–17.0)
Immature Granulocytes: 1 %
Lymphocytes Relative: 24 %
Lymphs Abs: 0.9 10*3/uL (ref 0.7–4.0)
MCH: 31.8 pg (ref 26.0–34.0)
MCHC: 35.4 g/dL (ref 30.0–36.0)
MCV: 89.6 fL (ref 80.0–100.0)
Monocytes Absolute: 0.5 10*3/uL (ref 0.1–1.0)
Monocytes Relative: 12 %
Neutro Abs: 2.4 10*3/uL (ref 1.7–7.7)
Neutrophils Relative %: 61 %
Platelets: 84 10*3/uL — ABNORMAL LOW (ref 150–400)
RBC: 3.18 MIL/uL — ABNORMAL LOW (ref 4.22–5.81)
RDW: 14.8 % (ref 11.5–15.5)
WBC: 3.8 10*3/uL — ABNORMAL LOW (ref 4.0–10.5)
nRBC: 0 % (ref 0.0–0.2)

## 2023-10-24 MED ORDER — ADULT MULTIVITAMIN W/MINERALS CH
1.0000 | ORAL_TABLET | Freq: Every day | ORAL | Status: DC
  Administered 2023-10-24 – 2023-10-26 (×3): 1 via ORAL
  Filled 2023-10-24 (×3): qty 1

## 2023-10-24 MED ORDER — ENSURE ENLIVE PO LIQD
237.0000 mL | Freq: Three times a day (TID) | ORAL | Status: DC
  Administered 2023-10-24 – 2023-10-26 (×5): 237 mL via ORAL

## 2023-10-24 NOTE — Plan of Care (Signed)

## 2023-10-24 NOTE — Progress Notes (Signed)
 Initial Nutrition Assessment  DOCUMENTATION CODES:   Not applicable  INTERVENTION:   -Continue dysphagia 2 diet -MVI with minerals daily -Ensure Enlive po TID, each supplement provides 350 kcal and 20 grams of protein -Magic cup TID with meals, each supplement provides 290 kcal and 9 grams of protein  -Feeding assistance with meals   NUTRITION DIAGNOSIS:   Increased nutrient needs related to acute illness (hepatic encepahlopathy) as evidenced by estimated needs.  GOAL:   Patient will meet greater than or equal to 90% of their needs  MONITOR:   PO intake, Supplement acceptance, Diet advancement  REASON FOR ASSESSMENT:   Consult Assessment of nutrition requirement/status  ASSESSMENT:   Pt with PMH of depression/anxiety, HTN, DM, COPD, CAD, chronic diastolic CHF, and 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  Pt admitted with hepatic encephalopathy.   2/26- s/p BSE- advanced to dysphagia 2 diet with thin liquids  Reviewed I/O's: +1 L x 24 hours and +2.1 L since admission  Rectal tube removed on 10/23/23. Pt had previously been receiving lactulose via rectal tube.   Pt sitting up in bed at time of visit. Noted pt with telesitter. Pt unable to provide history secondary to altered mental status. No family present. When RD greeted pt, he stated "I want to get out of here". RD attempted to reorient pt to place and situation and assured him we were here to help. Pt did not answer anymore of RD questions, even when asked if there was something this RD could assist him.   Noted pt consumed 50% of an orange juice. Pt advanced to a dysphagia 2 diet yesterday; noted meal completions 25%. MD concerned about pt receiving adequate protein secondary to hepatic encephalopathy.   Reviewed wt hx; pt has experienced a 3.5% wt loss over the past 3 months, which is not significant for time frame.   Medications reviewed  and include lactulose, xifaxin, and aldactone.   Labs reviewed: CBGS: 86-118 (inpatient orders for glycemic control are 0-9 units insulin aspart TID with meals).    NUTRITION - FOCUSED PHYSICAL EXAM:  Flowsheet Row Most Recent Value  Orbital Region No depletion  Upper Arm Region Mild depletion  Thoracic and Lumbar Region No depletion  Buccal Region No depletion  Temple Region No depletion  Clavicle Bone Region No depletion  Clavicle and Acromion Bone Region No depletion  Scapular Bone Region No depletion  Dorsal Hand No depletion  Patellar Region No depletion  Anterior Thigh Region No depletion  Posterior Calf Region Mild depletion  Edema (RD Assessment) None  Hair Reviewed  Eyes Reviewed  Mouth Reviewed  Skin Reviewed  Nails Reviewed       Diet Order:   Diet Order             DIET DYS 2 Room service appropriate? Yes; Fluid consistency: Thin  Diet effective now                   EDUCATION NEEDS:   Not appropriate for education at this time  Skin:  Skin Assessment: Reviewed RN Assessment  Last BM:  10/23/23 (type 5)  Height:   Ht Readings from Last 1 Encounters:  10/22/23 5\' 6"  (1.676 m)    Weight:   Wt Readings from Last 1 Encounters:  10/24/23 83 kg    Ideal Body Weight:  64.5 kg  BMI:  Body mass index is 29.53 kg/m.  Estimated Nutritional Needs:   Kcal:  2050-2250  Protein:  100-115 grams  Fluid:  > 2 L    Levada Schilling, RD, LDN, CDCES Registered Dietitian III Certified Diabetes Care and Education Specialist If unable to reach this RD, please use "RD Inpatient" group chat on secure chat between hours of 8am-4 pm daily

## 2023-10-24 NOTE — Progress Notes (Addendum)
 SLP Cancellation Note  Patient Details Name: ALMON WHITFORD MRN: [id number] DOB: [birth year]   Cancelled treatment:       Reason Eval/Treat Not Completed: Medical issues which prohibited therapy (Diet tolerance and trials of upgraded textures deferred at this time due to pt's overall mental status with waxing/waning agitation. Will continue efforts as appropriate.)  Clyde Canterbury, M.S., CCC-SLP Speech-Language Pathologist [facility] [phone] (ASCOM)  Woodroe Chen 10/24/2023, 8:10 AM

## 2023-10-24 NOTE — Telephone Encounter (Signed)
 [facility] sent a missed visit report for - Date of service: 02.27.25 Reason: patient hospitalized  Scheduler: Bertram Savin, PTA  [facility] [phone]

## 2023-10-24 NOTE — Progress Notes (Signed)
 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  He was started on lactulose retention enemas -> PO lactulose.  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 Presented with acute onset of AMS, thought to be related to hepatic encephalopathy Ammonia  185 on admission, down to 126 at last check He appears to be somewhat improved Has rectal tube and getting PR lactulose; will change to PO and titrate to 2-3 soft stools/day It is important to maintain dietary protein intake of 1.2-1.5g/kg/day; will request nutrition consult    Cirrhosis MELD 3.0 score is 14 Continue spironolactone, Rifaximin   HTN  Resume losartan, spironolactone IV hydralazine as needed    Chronic diastolic CHF 2D echo on 12/24/2022 showed EF 60 to 65% Appears to be compensated. Hold Lasix    COPD (chronic obstructive pulmonary disease) As needed bronchodilators   MDD (major depressive disorder) Continue citalopram and Seroquel   HLD (hyperlipidemia) Hold Lipitor   Diabetes mellitus without complication  Recent A1c 4.9, well-controlled No longer on home medications Cover with sensitive-scale SSI   Pancytopenia Chronic issue Hemoglobin stable   CAD (coronary artery disease) Troponin 22, 26, 27 - negative delta No chest pain Likely demand ischemia   Chronic kidney disease, stage 3a  Recent baseline creatinine 1.2-1.4 Appears to be stable Avoid nephrotoxic medications where possible   DNR Confirmed on admission [facility] documents reviewed       Consultants: Nutrition   Procedures: None    Antibiotics: None    30 Day Unplanned Readmission Risk Score    Flowsheet Row ED to Hosp-Admission (Current) from 10/21/2023 in [facility] 1C MEDICAL TELEMETRY  30 Day Unplanned Readmission Risk Score (%) 50.54 Filed at 10/24/2023 0801       This score is the patient's risk of an unplanned readmission within 30 days of being discharged (0 -100%). The score is based on dignosis, age, lab data, medications, orders, and past utilization.   Low:  0-14.9   Medium: 15-21.9   High: 22-29.9   Extreme: 30 and above           Subjective: He is feeling some better, still mildly confused.  Would like to go home.   I spoke with his group home manager.  At baseline, he can tell how he is feeling, sits up, eats.  When he is feeling bad, he is weak, legs are wobbly.  He talks very clearly and talks a lot at baseline, can use walker.      Objective: Vitals:   10/24/23 0358 10/24/23 0833  BP: (!) 154/86 (!) 174/82  Pulse: (!) 52 (!) 54  Resp: 20 17  Temp: 98.5 F (36.9 C) 98.1 F (36.7 C)  SpO2: 99% 100%    Intake/Output Summary (Last 24 hours) at 10/24/2023 1429 Last data filed at 10/24/2023 0900 Gross per 24 hour  Intake 0 ml  Output --  Net 0 ml   Filed Weights   10/22/23 1003 10/23/23 0441 10/24/23 0500  Weight: 84.4 kg 82.5 kg 83 kg    Exam:  General:  Appears calm and comfortable and is in  NAD, mildly confused Eyes:  EOMI, normal lids, iris ENT:  grossly normal hearing, lips & tongue, mmm Neck:  no LAD, masses or thyromegaly Cardiovascular:  RRR, no m/r/g. No LE edema.  Respiratory:   CTA bilaterally with no wheezes/rales/rhonchi.  Normal respiratory effort. Abdomen:  soft, NT, ND Skin:  no rash or induration seen on limited exam Musculoskeletal:  grossly normal tone BUE/BLE, good ROM, no bony abnormality Psychiatric:  mildly confused mood and affect, speech dysarthric and only mildly appropriate Neurologic:  CN 2-12 grossly intact, moves all  extremities in coordinated fashion  Data Reviewed: I have reviewed the patient's lab results since admission.  Pertinent labs for today include:   CO2 18 WBC 3.8 Hgb 10.1 Platelets 84, stable     Family Communication: None present; I spoke with his group home manager by telephone  Disposition: Status is: Inpatient Remains inpatient appropriate because: ongoing confusion     Time spent: 50 minutes  Unresulted Labs (From admission, onward)     Start     Ordered   10/25/23 0500  CBC with Differential/Platelet  Tomorrow morning,   R        10/24/23 1429   10/25/23 0500  Ammonia  Tomorrow morning,   R        10/24/23 1429   10/24/23 1429  Comprehensive metabolic panel  Once,   R        10/24/23 1429             Author: Jonah Blue, MD 10/24/2023 2:29 PM  For on call review [email or web address].

## 2023-10-25 DIAGNOSIS — K7682 Hepatic encephalopathy: Secondary | ICD-10-CM | POA: Diagnosis not present

## 2023-10-25 LAB — CBC WITH DIFFERENTIAL/PLATELET
Abs Immature Granulocytes: 0.01 10*3/uL (ref 0.00–0.07)
Basophils Absolute: 0 10*3/uL (ref 0.0–0.1)
Basophils Relative: 1 %
Eosinophils Absolute: 0.1 10*3/uL (ref 0.0–0.5)
Eosinophils Relative: 2 %
HCT: 30 % — ABNORMAL LOW (ref 39.0–52.0)
Hemoglobin: 10.3 g/dL — ABNORMAL LOW (ref 13.0–17.0)
Immature Granulocytes: 0 %
Lymphocytes Relative: 21 %
Lymphs Abs: 0.9 10*3/uL (ref 0.7–4.0)
MCH: 31.4 pg (ref 26.0–34.0)
MCHC: 34.3 g/dL (ref 30.0–36.0)
MCV: 91.5 fL (ref 80.0–100.0)
Monocytes Absolute: 0.5 10*3/uL (ref 0.1–1.0)
Monocytes Relative: 12 %
Neutro Abs: 2.8 10*3/uL (ref 1.7–7.7)
Neutrophils Relative %: 64 %
Platelets: 83 10*3/uL — ABNORMAL LOW (ref 150–400)
RBC: 3.28 MIL/uL — ABNORMAL LOW (ref 4.22–5.81)
RDW: 14.9 % (ref 11.5–15.5)
WBC: 4.4 10*3/uL (ref 4.0–10.5)
nRBC: 0 % (ref 0.0–0.2)

## 2023-10-25 LAB — AMMONIA: Ammonia: 25 umol/L (ref 9–35)

## 2023-10-25 MED ORDER — ENSURE ENLIVE PO LIQD: 237.0000 mL | Freq: Three times a day (TID) | ORAL | 12 refills | Status: AC

## 2023-10-25 NOTE — Plan of Care (Signed)

## 2023-10-25 NOTE — Discharge Summary (Signed)
 Physician Discharge Summary   Patient: [name] MRN: [id number] DOB: [birth year]  Admit date:     10/21/2023  Discharge date: 10/25/23  Discharge Physician: Jonah Blue   PCP: [facility]   Recommendations at discharge:   Continue to take lactulose up to 3 times a day; decrease frequency if needed for more than 2-3 soft stools per day Home health PT and RN has been ordered Dysphagia 2 diet with thin liquids Follow up with PCP in 1-2 weeks  Discharge Diagnoses: Principal Problem:   Hepatic encephalopathy (HCC) Active Problems:   Essential hypertension   CAD (coronary artery disease)   Myocardial injury   Pancytopenia (HCC)   Liver cirrhosis (HCC)   Hyperlipidemia   COPD (chronic obstructive pulmonary disease) (HCC)   Type II diabetes mellitus with renal manifestations (HCC)   Chronic kidney disease, stage 3a (HCC)   Chronic diastolic CHF (congestive heart failure) (HCC)   Depression   Hospital Course: 73yo with h/o depression/anxiety, HTN, DM, COPD, CAD, chronic diastolic CHF, and 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  He was started on lactulose retention enemas -> PO lactulose.  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 Presented with acute onset of AMS, thought to be related to hepatic encephalopathy Ammonia 185 on admission, down to 25 He appears to be back to baseline Has rectal tube and getting PR lactulose; will change to PO and titrate to 2-3 soft stools/day It is important to maintain dietary protein intake of 1.2-1.5g/kg/day   Cirrhosis MELD 3.0 score is 14 Continue spironolactone, Rifaximin   HTN  Resume losartan, spironolactone   Chronic diastolic CHF 2D echo on 12/24/2022 showed EF 60 to 65% Appears to be compensated Resume Lasix    COPD (chronic obstructive  pulmonary disease) As needed bronchodilators   MDD (major depressive disorder) Continue citalopram and Seroquel   HLD (hyperlipidemia) Resume Lipitor   Diabetes mellitus without complication  Recent A1c 4.9, well-controlled No longer on home medications Does not need ongoing monitoring at this time   Pancytopenia Chronic issue Hemoglobin stable   CAD (coronary artery disease) Troponin 22, 26, 27 - negative delta No chest pain Likely demand ischemia   Chronic kidney disease, stage 3a  Recent baseline creatinine 1.2-1.4 Appears to be stable Avoid nephrotoxic medications where possible  Nutrition Status Nutrition Problem: Increased nutrient needs Etiology: acute illness (hepatic encepahlopathy) Signs/Symptoms: estimated needs Interventions: Ensure Enlive (each supplement provides 350kcal and 20 grams of protein), Magic cup, MVI   DNR Confirmed on admission [facility] documents reviewed       Consultants: Nutrition   Procedures: None   Antibiotics: None    Pain control - [place] Controlled Substance Reporting System database was reviewed. and patient was instructed, not to drive, operate heavy machinery, perform activities at heights, swimming or participation in water activities or provide baby-sitting services while on Pain, Sleep and Anxiety Medications; until their outpatient Physician has advised to do so again. Also recommended to not to take more than prescribed Pain, Sleep and Anxiety Medications.    Disposition: Group home Diet recommendation:  Dysphagia 2 diet, thin liquids DISCHARGE MEDICATION: Allergies as of 10/25/2023       Reactions   Bee Venom Anaphylaxis   Codeine Itching   Only itching. Recently allergy tested at [facility]   Rsv Mrna Pre-f Virus Vaccine Swelling  Meloxicam    Other Reaction(s): ?overdose of local given at dentist--had trouble breathing   Codeine    Novocain [procaine]    Sulfa Antibiotics Other (See Comments)   Sulfa  Antibiotics    hallucinations        Medication List     TAKE these medications    acetaminophen 650 MG CR tablet Commonly known as: TYLENOL Take 650 mg by mouth every 8 (eight) hours as needed for pain.   atorvastatin 40 MG tablet Commonly known as: LIPITOR Take 40 mg by mouth daily.   citalopram 20 MG tablet Commonly known as: CELEXA Take 20 mg by mouth daily.   EYE MULTIVITAMIN PO Take 1 tablet by mouth daily.   feeding supplement Liqd Take 237 mLs by mouth 3 (three) times daily between meals.   furosemide 20 MG tablet Commonly known as: LASIX Take 1 tablet (20 mg total) by mouth daily. What changed: Another medication with the same name was removed. Continue taking this medication, and follow the directions you see here.   gabapentin 300 MG capsule Commonly known as: NEURONTIN Take 300 mg by mouth 2 (two) times daily.   lactulose 10 GM/15ML solution Commonly known as: CHRONULAC Take 45 mLs (30 g total) by mouth 3 (three) times daily.   losartan 50 MG tablet Commonly known as: COZAAR Take 50 mg by mouth daily.   methocarbamol 500 MG tablet Commonly known as: ROBAXIN Take 1 tablet (500 mg total) by mouth every 6 (six) hours as needed for muscle spasms.   multivitamin with minerals Tabs tablet Take 1 tablet by mouth daily.   nitroGLYCERIN 0.4 MG/SPRAY spray Commonly known as: NITROLINGUAL Place 1 spray under the tongue every 5 (five) minutes x 3 doses as needed for chest pain. What changed: Another medication with the same name was removed. Continue taking this medication, and follow the directions you see here.   ondansetron 4 MG tablet Commonly known as: ZOFRAN Take 1 tablet (4 mg total) by mouth every 6 (six) hours as needed for nausea.   pantoprazole 40 MG tablet Commonly known as: PROTONIX Take 40 mg by mouth daily.   phenol 1.4 % Liqd Commonly known as: CHLORASEPTIC Use as directed 1 spray in the mouth or throat as needed for throat  irritation / pain.   QUEtiapine 25 MG tablet Commonly known as: SEROQUEL Take 25 mg by mouth at bedtime as needed (Agitation/sleep).   senna 8.6 MG Tabs tablet Commonly known as: SENOKOT Take 1 tablet (8.6 mg total) by mouth 2 (two) times daily as needed for mild constipation.   spironolactone 25 MG tablet Commonly known as: ALDACTONE Take 25 mg by mouth daily.   Ventolin HFA 108 (90 Base) MCG/ACT inhaler Generic drug: albuterol Inhale 2 puffs into the lungs every 4 (four) hours as needed for wheezing.   albuterol (2.5 MG/3ML) 0.083% nebulizer solution Commonly known as: PROVENTIL Take 2.5 mg by nebulization every 4 (four) hours as needed for wheezing or shortness of breath.   Xifaxan 550 MG Tabs tablet Generic drug: rifaximin Take 550 mg by mouth 2 (two) times daily.        Follow-up Information     [facility] Follow up.   Why: Hospital follow up Contact information: [address] [place] [address] [phone]                Discharge Exam:    Subjective: Feeling ok, not confused, sitting up in chair at baseline.  Objective: Vitals:   10/25/23 0436 10/25/23 0809  BP: (!) 168/74 (!) 152/101  Pulse: (!) 47 (!) 53  Resp:  17  Temp:  98.4 F (36.9 C)  SpO2:  100%    Intake/Output Summary (Last 24 hours) at 10/25/2023 1145 Last data filed at 10/25/2023 1042 Gross per 24 hour  Intake 240 ml  Output --  Net 240 ml   Filed Weights   10/22/23 1003 10/23/23 0441 10/24/23 0500  Weight: 84.4 kg 82.5 kg 83 kg    Exam:  General:  Appears calm and comfortable and is in NAD, up in bedside chair Eyes:  EOMI, normal lids, iris ENT:  grossly normal hearing, lips & tongue, mmm Neck:  no LAD, masses or thyromegaly Cardiovascular:  RRR, no m/r/g. No LE edema.  Respiratory:   CTA bilaterally with no wheezes/rales/rhonchi.  Normal respiratory effort. Abdomen:  soft, NT, ND Skin:  no rash or induration seen on limited  exam Musculoskeletal:  grossly normal tone BUE/BLE, good ROM, no bony abnormality Psychiatric:  blunted mood and affect, speech sparse but appropriate Neurologic:  CN 2-12 grossly intact, moves all extremities in coordinated fashion  Data Reviewed: I have reviewed the patient's lab results since admission.  Pertinent labs for today include:   Stable CMP NH4 25 Stable CBC    Condition at discharge: improving  The results of significant diagnostics from this hospitalization (including imaging, microbiology, ancillary and laboratory) are listed below for reference.   Imaging Studies: CT Head Wo Contrast Result Date: 10/21/2023 CLINICAL DATA:  Mental status change, unknown cause. EXAM: CT HEAD WITHOUT CONTRAST TECHNIQUE: Contiguous axial images were obtained from the base of the skull through the vertex without intravenous contrast. RADIATION DOSE REDUCTION: This exam was performed according to the departmental dose-optimization program which includes automated exposure control, adjustment of the mA and/or kV according to patient size and/or use of iterative reconstruction technique. COMPARISON:  CT head without contrast 09/30/2023 FINDINGS: Brain: No acute infarct, hemorrhage, or mass lesion is present. Mild atrophy and white matter changes are similar the prior exam. The ventricles are of normal size. No significant extraaxial fluid collection is present. The brainstem and cerebellum are within normal limits. Midline structures are within normal limits. Vascular: No hyperdense vessel or unexpected calcification. Skull: Calvarium is intact. No focal lytic or blastic lesions are present. No significant extracranial soft tissue lesion is present. Sinuses/Orbits: The paranasal sinuses and mastoid air cells are clear. The globes and orbits are within normal limits. IMPRESSION: 1. No acute intracranial abnormality or significant interval change. 2. Mild atrophy and white matter disease likely reflects  the sequela of chronic microvascular ischemia. Electronically Signed   By: Marin Roberts M.D.   On: 10/21/2023 19:40   DG Chest Port 1 View Result Date: 09/30/2023 CLINICAL DATA:  Altered mental status EXAM: PORTABLE CHEST 1 VIEW COMPARISON:  Chest x-ray 07/29/2023 FINDINGS: The heart size and mediastinal contours are within normal limits. Both lungs are clear. Cervical spinal fusion plate is present. No acute fractures are seen. IMPRESSION: No active disease. Electronically Signed   By: Darliss Cheney M.D.   On: 09/30/2023 15:34   CT Head Wo Contrast Result Date: 09/30/2023 CLINICAL DATA:  Mental status change, unknown cause EXAM: CT HEAD WITHOUT CONTRAST TECHNIQUE: Contiguous axial images were obtained from the base of the skull through the vertex without intravenous contrast. RADIATION DOSE REDUCTION: This exam was performed according to the departmental dose-optimization program which includes automated exposure control, adjustment of the mA  and/or kV according to patient size and/or use of iterative reconstruction technique. COMPARISON:  None Available. FINDINGS: Brain: No evidence of large-territorial acute infarction. No parenchymal hemorrhage. No mass lesion. No extra-axial collection. No mass effect or midline shift. No hydrocephalus. Basilar cisterns are patent. Vascular: No hyperdense vessel. Skull: No acute fracture or focal lesion. Sinuses/Orbits: Paranasal sinuses and mastoid air cells are clear. The orbits are unremarkable. Other: None. IMPRESSION: No acute intracranial abnormality. Electronically Signed   By: Tish Frederickson M.D.   On: 09/30/2023 09:08    Microbiology: Results for orders placed or performed during the hospital encounter of 10/21/23  Resp panel by RT-PCR (RSV, Flu A&B, Covid) Anterior Nasal Swab     Status: None   Collection Time: 10/21/23  5:40 PM   Specimen: Anterior Nasal Swab  Result Value Ref Range Status   SARS Coronavirus 2 by RT PCR NEGATIVE NEGATIVE Final     Comment: (NOTE) SARS-CoV-2 target nucleic acids are NOT DETECTED.  The SARS-CoV-2 RNA is generally detectable in upper respiratory specimens during the acute phase of infection. The lowest concentration of SARS-CoV-2 viral copies this assay can detect is 138 copies/mL. A negative result does not preclude SARS-Cov-2 infection and should not be used as the sole basis for treatment or other patient management decisions. A negative result may occur with  improper specimen collection/handling, submission of specimen other than nasopharyngeal swab, presence of viral mutation(s) within the areas targeted by this assay, and inadequate number of viral copies(<138 copies/mL). A negative result must be combined with clinical observations, patient history, and epidemiological information. The expected result is Negative.  Fact Sheet for Patients:  BloggerCourse.com  Fact Sheet for Healthcare Providers:  SeriousBroker.it  This test is no t yet approved or cleared by the [place] FDA and  has been authorized for detection and/or diagnosis of SARS-CoV-2 by FDA under an Emergency Use Authorization (EUA). This EUA will remain  in effect (meaning this test can be used) for the duration of the COVID-19 declaration under Section 564(b)(1) of the Act, 21 U.S.C.section 360bbb-3(b)(1), unless the authorization is terminated  or revoked sooner.       Influenza A by PCR NEGATIVE NEGATIVE Final   Influenza B by PCR NEGATIVE NEGATIVE Final    Comment: (NOTE) The Xpert Xpress SARS-CoV-2/FLU/RSV plus assay is intended as an aid in the diagnosis of influenza from Nasopharyngeal swab specimens and should not be used as a sole basis for treatment. Nasal washings and aspirates are unacceptable for Xpert Xpress SARS-CoV-2/FLU/RSV testing.  Fact Sheet for Patients: BloggerCourse.com  Fact Sheet for Healthcare  Providers: SeriousBroker.it  This test is not yet approved or cleared by the [place] FDA and has been authorized for detection and/or diagnosis of SARS-CoV-2 by FDA under an Emergency Use Authorization (EUA). This EUA will remain in effect (meaning this test can be used) for the duration of the COVID-19 declaration under Section 564(b)(1) of the Act, 21 U.S.C. section 360bbb-3(b)(1), unless the authorization is terminated or revoked.     Resp Syncytial Virus by PCR NEGATIVE NEGATIVE Final    Comment: (NOTE) Fact Sheet for Patients: BloggerCourse.com  Fact Sheet for Healthcare Providers: SeriousBroker.it  This test is not yet approved or cleared by the [place] FDA and has been authorized for detection and/or diagnosis of SARS-CoV-2 by FDA under an Emergency Use Authorization (EUA). This EUA will remain in effect (meaning this test can be used) for the duration of the COVID-19 declaration under Section 564(b)(1) of the  Act, 21 U.S.C. section 360bbb-3(b)(1), unless the authorization is terminated or revoked.  Performed at [facility], [address] [place], [place] [address]     Labs: CBC: Recent Labs  Lab 10/21/23 1842 10/22/23 0507 10/23/23 0831 10/24/23 0532 10/25/23 0526  WBC 2.3* 2.6* 3.4* 3.8* 4.4  NEUTROABS 1.1*  --  2.4 2.4 2.8  HGB 11.2* 10.3* 10.7* 10.1* 10.3*  HCT 31.6* 29.9* 31.2* 28.5* 30.0*  MCV 89.0 89.3 91.2 89.6 91.5  PLT 96* 92* 85* 84* 83*   Basic Metabolic Panel: Recent Labs  Lab 10/21/23 1842 10/22/23 0507 10/23/23 0831 10/24/23 0532 10/24/23 1512  NA 142 142 142 137 139  K 3.7 3.4* 3.3* 3.5 3.5  CL 111 115* 115* 111 112*  CO2 22 20* 20* 18* 20*  GLUCOSE 101* 88 109* 82 89  BUN 19 19 17 14 14   CREATININE 1.44* 1.26* 1.02 0.99 0.95  CALCIUM 10.1 9.5 9.6 9.5 9.6   Liver Function Tests: Recent Labs  Lab 10/21/23 1842 10/23/23 0831  10/24/23 1512  AST 41 47* 50*  ALT 24 25 29   ALKPHOS 75 55 55  BILITOT 1.4* 1.8* 2.3*  PROT 6.2* 6.5 6.5  ALBUMIN 2.6* 2.7* 2.7*   CBG: Recent Labs  Lab 10/23/23 2135 10/24/23 0834 10/24/23 1214 10/24/23 1610 10/24/23 1947  GLUCAP 90 82 111* 81 98    Discharge time spent: greater than 30 minutes.  Signed: Jonah Blue, MD [facility] 10/25/2023

## 2023-10-25 NOTE — Progress Notes (Signed)
   10/25/23 2000  Spiritual Encounters  Type of Visit Initial  Care provided to: Patient  Conversation partners present during encounter Nurse  Referral source Patient request  Reason for visit Routine spiritual support  OnCall Visit Yes   Patient requested to speak with a chaplain because he was feeling lonely. Chaplain sat and talked with the patient and offered him comforting presence. Patient shared that he is ready to go home and that he misses his girlfriend. Chaplain services remain available for spiritual and emotional support.

## 2023-10-25 NOTE — TOC Progression Note (Addendum)
 Transition of Care [facility]) - Progression Note    Patient Details  Name: [name] MRN: [id number] Date of Birth: [birth year]  [facility] CM/SW Contact  Erin Sons, [place] Phone Number: 10/25/2023, 12:10 PM  Clinical Narrative:     CSW called pt's group home and notified of DC. [name] from Group home will pick pt up at 130pm. HH has been arranged with [facility]. [facility] notified of DC.   1222: DC cancelled; clement notified.        Expected Discharge Plan and Services         Expected Discharge Date: 10/25/23                                     Social Determinants of Health (SDOH) Interventions SDOH Screenings   Food Insecurity: Patient Unable To Answer (10/22/2023)  Recent Concern: Food Insecurity - Food Insecurity Present (07/30/2023)  Housing: Patient Unable To Answer (10/22/2023)  Transportation Needs: Patient Unable To Answer (10/22/2023)  Utilities: Patient Unable To Answer (10/22/2023)  Alcohol Screen: Low Risk  (09/24/2022)  Depression (PHQ2-9): High Risk (04/26/2022)  Financial Resource Strain: High Risk (04/27/2022)  Physical Activity: Sufficiently Active (04/16/2018)  Social Connections: Patient Unable To Answer (10/22/2023)  Stress: Stress Concern Present (04/16/2018)  Tobacco Use: High Risk (10/22/2023)    Readmission Risk Interventions    06/18/2023   12:36 PM 11/07/2022    1:45 PM  Readmission Risk Prevention Plan  Transportation Screening Complete Complete  Medication Review Oceanographer) Complete   PCP or Specialist appointment within 3-5 days of discharge Complete   HRI or Home Care Consult Complete   SW Recovery Care/Counseling Consult Not Complete Complete  Palliative Care Screening Not Complete Not Applicable  Skilled Nursing Facility Complete Not Applicable

## 2023-10-25 NOTE — Evaluation (Signed)
 Physical Therapy Evaluation Patient Details Name: [name] MRN: [id number] DOB: 28-Jan-1951 Today's Date: 10/25/2023  History of Present Illness  Pt is a 73 y.o. male with medical history significant of  depression, anxiety, hypertension, hyperlipidemia, diabetes mellitus, COPD, CAD, dCHF, TIA, migraine headache, hepatitis C, liver cirrhosis, hepatic encephalopathy, liver lesion (suspecting cancer), who presents with altered mental status. MD assessment includes hepatic encephalopathy.   Clinical Impression  Pt was pleasant and motivated to participate during the session and put forth good effort throughout. Pt required no physical assistance during the session and demonstrated good control and stability with transfers from various surfaces including from toilet at standard height.  Pt was generally steady with gait but required cuing for navigating tight spaces and to stay within the RW during 180 deg turn practice.  Pt reported no adverse symptoms during the session with SpO2 and HR WNL throughout on room air.  Pt will benefit from continued PT services upon discharge to safely address deficits listed in patient problem list for decreased caregiver assistance and eventual return to PLOF.           If plan is discharge home, recommend the following: A little help with walking and/or transfers;A little help with bathing/dressing/bathroom;Assist for transportation;Help with stairs or ramp for entrance;Assistance with cooking/housework   Can travel by private vehicle        Equipment Recommendations None recommended by PT  Recommendations for Other Services       Functional Status Assessment Patient has had a recent decline in their functional status and demonstrates the ability to make significant improvements in function in a reasonable and predictable amount of time.     Precautions / Restrictions Precautions Precautions: Fall Restrictions Weight Bearing Restrictions Per  Provider Order: No      Mobility  Bed Mobility Overal bed mobility: Modified Independent             General bed mobility comments: Min extra time and effort only    Transfers Overall transfer level: Needs assistance Equipment used: Rolling walker (2 wheels) Transfers: Sit to/from Stand Sit to Stand: Supervision           General transfer comment: Good eccentric and concentric control and stability from various height surfaces without the need of UE assist    Ambulation/Gait Ambulation/Gait assistance: Supervision Gait Distance (Feet): 15 Feet x 2 Assistive device: Rolling walker (2 wheels) Gait Pattern/deviations: Step-through pattern, Decreased step length - right, Decreased step length - left Gait velocity: decreased     General Gait Details: Slow cadence but steady without LOB; required some cuing to avoid obstacles secondary to visual deficits that are chronic per patient  Stairs            Wheelchair Mobility     Tilt Bed    Modified Rankin (Stroke Patients Only)       Balance Overall balance assessment: Needs assistance   Sitting balance-Leahy Scale: Normal     Standing balance support: Bilateral upper extremity supported, Single extremity supported, During functional activity Standing balance-Leahy Scale: Good                               Pertinent Vitals/Pain Pain Assessment Pain Assessment: No/denies pain    Home Living Family/patient expects to be discharged to:: Group home Living Arrangements: Group Home   Type of Home: Group Home Home Access: Ramped entrance       Home Layout:  One level Home Equipment: Agricultural consultant (2 wheels);Educational psychologist (4 wheels)      Prior Function Prior Level of Function : Needs assist;History of Falls (last six months)             Mobility Comments: Mod Ind amb limited community distances with a RW, 2-3 falls in the last 6 months, unsure of cause ADLs Comments: Group  home staff assists with ADLs     Extremity/Trunk Assessment   Upper Extremity Assessment Upper Extremity Assessment: Defer to OT evaluation    Lower Extremity Assessment Lower Extremity Assessment: Generalized weakness       Communication   Communication Communication: No apparent difficulties    Cognition Arousal: Alert Behavior During Therapy: WFL for tasks assessed/performed   PT - Cognitive impairments: No apparent impairments                         Following commands: Intact       Cueing Cueing Techniques: Verbal cues, Tactile cues     General Comments      Exercises     Assessment/Plan    PT Assessment Patient needs continued PT services  PT Problem List Decreased strength;Decreased activity tolerance;Decreased balance;Decreased mobility;Decreased knowledge of use of DME       PT Treatment Interventions DME instruction;Gait training;Functional mobility training;Therapeutic activities;Therapeutic exercise;Balance training;Patient/family education    PT Goals (Current goals can be found in the Care Plan section)  Acute Rehab PT Goals Patient Stated Goal: To return home PT Goal Formulation: With patient Time For Goal Achievement: 11/07/23 Potential to Achieve Goals: Good    Frequency Min 1X/week     Co-evaluation               AM-PAC PT "6 Clicks" Mobility  Outcome Measure Help needed turning from your back to your side while in a flat bed without using bedrails?: None Help needed moving from lying on your back to sitting on the side of a flat bed without using bedrails?: None Help needed moving to and from a bed to a chair (including a wheelchair)?: A Little Help needed standing up from a chair using your arms (e.g., wheelchair or bedside chair)?: A Little Help needed to walk in hospital room?: A Little Help needed climbing 3-5 steps with a railing? : A Little 6 Click Score: 20    End of Session Equipment Utilized During  Treatment: Gait belt Activity Tolerance: Patient tolerated treatment well Patient left: in chair;with call bell/phone within reach;with chair alarm set;with nursing/sitter in room Nurse Communication: Mobility status PT Visit Diagnosis: History of falling (Z91.81);Difficulty in walking, not elsewhere classified (R26.2);Muscle weakness (generalized) (M62.81)    Time: 1610-9604 PT Time Calculation (min) (ACUTE ONLY): 29 min   Charges:   PT Evaluation $PT Eval Moderate Complexity: 1 Mod PT Treatments $Gait Training: 8-22 mins PT General Charges $$ ACUTE PT VISIT: 1 Visit    D. Scott Ersel Enslin PT, DPT 10/25/23, 10:18 AM

## 2023-10-25 NOTE — Progress Notes (Signed)
 After discharge, nurse reported ongoing concerns regarding confusion.  Patient reportedly was reporting being on a [place].  Based on concerns for persistently AMS, will hold discharge for today and continue to monitor.Georgana Curio, M.D.

## 2023-10-25 NOTE — Care Management Important Message (Signed)
 Important Message  Patient Details  Name: [name] MRN: [id number] Date of Birth: 1950/12/12   Important Message Given:  Yes - Medicare IM     Cristela Blue, CMA 10/25/2023, 10:57 AM

## 2023-10-25 NOTE — TOC Progression Note (Incomplete Revision)
Transition of Care [facility]) - Progression Note    Patient Details  Name: [name] MRN: [id number] Date of Birth: [birth year]  Transition of Care [facility]) CM/SW Contact  Erin Sons, [place] Phone Number: 10/25/2023, 12:10 PM  Clinical Narrative:     CSW called pt's group home and notified of DC. [name] from Group home will pick pt up at 130pm. HH has been arranged with [facility]. [facility] notified of DC.   1222: DC cancelled; clement notified.        Expected Discharge Plan and Services         Expected Discharge Date: 10/25/23                                     Social Determinants of Health (SDOH) Interventions SDOH Screenings   Food Insecurity: Patient Unable To Answer (10/22/2023)  Recent Concern: Food Insecurity - Food Insecurity Present (07/30/2023)  Housing: Patient Unable To Answer (10/22/2023)  Transportation Needs: Patient Unable To Answer (10/22/2023)  Utilities: Patient Unable To Answer (10/22/2023)  Alcohol Screen: Low Risk  (09/24/2022)  Depression (PHQ2-9): High Risk (04/26/2022)  Financial Resource Strain: High Risk (04/27/2022)  Physical Activity: Sufficiently Active (04/16/2018)  Social Connections: Patient Unable To Answer (10/22/2023)  Stress: Stress Concern Present (04/16/2018)  Tobacco Use: High Risk (10/22/2023)    Readmission Risk Interventions    06/18/2023   12:36 PM 11/07/2022    1:45 PM  Readmission Risk Prevention Plan  Transportation Screening Complete Complete  Medication Review Oceanographer) Complete   PCP or Specialist appointment within 3-5 days of discharge Complete   HRI or Home Care Consult Complete   SW Recovery Care/Counseling Consult Not Complete Complete  Palliative Care Screening Not Complete Not Applicable  Skilled Nursing Facility Complete Not Applicable

## 2023-10-25 NOTE — Evaluation (Signed)
 Occupational Therapy Evaluation Patient Details Name: [name] MRN: [id number] DOB: [birth year] Today's Date: 10/25/2023   History of Present Illness   Pt is a 73 y.o. male with medical history significant of  depression, anxiety, hypertension, hyperlipidemia, diabetes mellitus, COPD, CAD, dCHF, TIA, migraine headache, hepatitis C, liver cirrhosis, hepatic encephalopathy, liver lesion (suspecting cancer), who presents with altered mental status. MD assessment includes hepatic encephalopathy.     Clinical Impressions Pt was seen for OT evaluation this date. Pt was agreeable to treat, tearful about postponing discharge, able to redirect. Pt A/O to name and place only. Prior to hospital admission, pt was amb with DME as needed within home. Pt lives at group home, dependent on group home staff for assistance with ADLs. Pt presents to acute OT demonstrating impaired ADL performance, executive functioning, sequencing task, and decreased activity tolerance. Pt currently requires CGA for STS from recliner and regular toilet seat height. Pt soiled on arrival to room, pt unaware. Pt tolerated standing throughout pericare. Pt amb ~5ft throughout session to bathroom and back to chair with use of RW, mod A required for DME management, pt unable to maneuver RW without assistance. Pt required MAX cues for grooming task at sink level. Unable to sequencing steps for handwashing without physical assistance. Pt would benefit from skilled OT to address noted impairments and functional limitations (see below for any additional details). OT will follow acutely.     If plan is discharge home, recommend the following:   Assistance with feeding;Assist for transportation;Direct supervision/assist for medications management;Help with stairs or ramp for entrance;Supervision due to cognitive status;Direct supervision/assist for financial management;Assistance with cooking/housework;A little help with  bathing/dressing/bathroom     Functional Status Assessment   Patient has had a recent decline in their functional status and demonstrates the ability to make significant improvements in function in a reasonable and predictable amount of time.     Equipment Recommendations   Other (comment) (Defer to next location)     Recommendations for Other Services         Precautions/Restrictions   Precautions Precautions: Fall Restrictions Weight Bearing Restrictions Per Provider Order: No     Mobility Bed Mobility               General bed mobility comments: NT in chair pre/post session    Transfers Overall transfer level: Needs assistance Equipment used: Rolling walker (2 wheels) Transfers: Sit to/from Stand Sit to Stand: Contact guard assist           General transfer comment: Mod physical A required for DME management during OOB mobility      Balance Overall balance assessment: Needs assistance Sitting-balance support: Feet supported Sitting balance-Leahy Scale: Good     Standing balance support: Single extremity supported, During functional activity Standing balance-Leahy Scale: Good Standing balance comment: Reliant on at least one extermity supported during standing tasks                           ADL either performed or assessed with clinical judgement   ADL Overall ADL's : Needs assistance/impaired Eating/Feeding: Sitting;Moderate assistance Eating/Feeding Details (indicate cue type and reason): Able to bring food to mouth, requires max A for set up and mod physical assistance to carryout task to completion Grooming: Wash/dry hands;Wash/dry face;Cueing for sequencing;Cueing for safety;Standing;Maximal assistance Grooming Details (indicate cue type and reason): Multi modal cues for sequencing and handplacement at sink level     Lower Body Bathing:  Maximal assistance;Cueing for sequencing;Cueing for safety;Sit to/from stand   Upper  Body Dressing : Maximal assistance;Cueing for sequencing;Standing Upper Body Dressing Details (indicate cue type and reason): Donning gown Lower Body Dressing: Moderate assistance;Cueing for sequencing Lower Body Dressing Details (indicate cue type and reason): adjusting socks in sitting Toilet Transfer: Contact guard assist;Cueing for safety;Cueing for sequencing;Rolling walker (2 wheels);Regular Toilet   Toileting- Clothing Manipulation and Hygiene: Maximal assistance;Cueing for sequencing       Functional mobility during ADLs: Contact guard assist;Rolling walker (2 wheels);Cueing for sequencing;Cueing for safety General ADL Comments: Pt required inconsistant assistance levels throughuot ADL tasks, consistantly requiring tactile and vcs for sequencing and safety with DME management     Vision         Perception         Praxis         Pertinent Vitals/Pain Pain Assessment Pain Assessment: No/denies pain     Extremity/Trunk Assessment Upper Extremity Assessment Upper Extremity Assessment: Overall WFL for tasks assessed;Generalized weakness   Lower Extremity Assessment Lower Extremity Assessment: Defer to PT evaluation;Overall WFL for tasks assessed       Communication Communication Communication: No apparent difficulties   Cognition Arousal: Alert Behavior During Therapy: WFL for tasks assessed/performed Cognition: Cognition impaired, No family/caregiver present to determine baseline   Orientation impairments: Time, Situation Awareness: Intellectual awareness impaired     Executive functioning impairment (select all impairments): Initiation, Sequencing, Reasoning, Problem solving, Organization OT - Cognition Comments: A/O name and place only, pt demonstrates delays in all above executive fucntioning skills, noted significantly during grooming tasks at sink level and feeding set up. Pt provided inconsistent home details and unable to recall where is has been  recently.                 Following commands: Intact       Cueing  General Comments   Cueing Techniques: Verbal cues;Tactile cues      Exercises Other Exercises Other Exercises: Edu: role of OT, benefits of OOB activity, DME management   Shoulder Instructions      Home Living Family/patient expects to be discharged to:: Group home Living Arrangements: Group Home Available Help at Discharge: Personal care attendant Type of Home: Group Home Home Access: Ramped entrance     Home Layout: One level     Bathroom Shower/Tub: Producer, television/film/video: Handicapped height     Home Equipment: Agricultural consultant (2 wheels);Shower seat;Rollator (4 wheels)          Prior Functioning/Environment Prior Level of Function : Needs assist;History of Falls (last six months)             Mobility Comments: Mod Ind amb limited community distances with a RW, 2-3 falls in the last 6 months, unsure of cause ADLs Comments: Group home staff assists with ADLs, ?historian    OT Problem List: Decreased coordination;Decreased cognition;Decreased safety awareness;Decreased knowledge of use of DME or AE   OT Treatment/Interventions: Self-care/ADL training;Energy conservation;Therapeutic activities;DME and/or AE instruction;Cognitive remediation/compensation      OT Goals(Current goals can be found in the care plan section)   Acute Rehab OT Goals Patient Stated Goal: to leave hospital OT Goal Formulation: With patient Time For Goal Achievement: 11/08/23 Potential to Achieve Goals: Good ADL Goals Pt Will Perform Grooming: with supervision;standing Pt Will Perform Lower Body Dressing: sit to/from stand;with min assist Pt Will Transfer to Toilet: with min assist;ambulating Pt Will Perform Toileting - Clothing Manipulation and hygiene: sit to/from  stand;with max assist   OT Frequency:  Min 1X/week    Co-evaluation              AM-PAC OT "6 Clicks" Daily Activity      Outcome Measure Help from another person eating meals?: A Lot Help from another person taking care of personal grooming?: A Lot Help from another person toileting, which includes using toliet, bedpan, or urinal?: A Lot Help from another person bathing (including washing, rinsing, drying)?: A Lot Help from another person to put on and taking off regular upper body clothing?: A Lot Help from another person to put on and taking off regular lower body clothing?: A Little 6 Click Score: 13   End of Session Equipment Utilized During Treatment: Gait belt;Rolling walker (2 wheels) Nurse Communication: Mobility status  Activity Tolerance: Patient tolerated treatment well Patient left: in chair;with call bell/phone within reach;with chair alarm set  OT Visit Diagnosis: Unsteadiness on feet (R26.81);Other abnormalities of gait and mobility (R26.89);Muscle weakness (generalized) (M62.81);Other symptoms and signs involving cognitive function                Time: 1249-1315 OT Time Calculation (min): 26 min Charges:  OT General Charges $OT Visit: 1 Visit OT Evaluation $OT Eval Moderate Complexity: 1 Mod OT Treatments $Self Care/Home Management : 8-22 mins  Glenard Haring M.S. OTR/L  10/25/23, 2:07 PM

## 2023-10-26 DIAGNOSIS — K7682 Hepatic encephalopathy: Secondary | ICD-10-CM | POA: Diagnosis not present

## 2023-10-26 NOTE — Progress Notes (Signed)
 Mobility Specialist - Progress Note   10/26/23 0842  Mobility  Activity Ambulated with assistance in room;Stood at bedside;Dangled on edge of bed  Level of Assistance Contact guard assist, steadying assist  Assistive Device Front wheel walker  Distance Ambulated (ft) 5 ft  Activity Response Tolerated well  Mobility Referral Yes  Mobility visit 1 Mobility  Mobility Specialist Start Time (ACUTE ONLY) 0802  Mobility Specialist Stop Time (ACUTE ONLY) [id number]  Mobility Specialist Time Calculation (min) (ACUTE ONLY) 19 min   Pt supine in bed on RA upon arrival. Pt completes bed mobility HHA. Pt STS from heightened bed surface and ambulates a few steps forward and side to side to transfer to recliner. Pt needs VC throughout for task initiation. Pt left in recliner with needs in reach and chair alarm activated.   Terrilyn Saver  Mobility Specialist  10/26/23 8:43 AM

## 2023-10-26 NOTE — Plan of Care (Signed)
  Problem: Education: Goal: Ability to describe self-care measures that may prevent or decrease complications (Diabetes Survival Skills Education) will improve Outcome: Progressing   Problem: Coping: Goal: Ability to adjust to condition or change in health will improve Outcome: Progressing   Problem: Health Behavior/Discharge Planning: Goal: Ability to manage health-related needs will improve Outcome: Progressing   Problem: Skin Integrity: Goal: Risk for impaired skin integrity will decrease Outcome: Progressing

## 2023-10-26 NOTE — Plan of Care (Signed)
  Problem: Education: Goal: Ability to describe self-care measures that may prevent or decrease complications (Diabetes Survival Skills Education) will improve Outcome: Adequate for Discharge Goal: Individualized Educational Video(s) Outcome: Adequate for Discharge   Problem: Coping: Goal: Ability to adjust to condition or change in health will improve Outcome: Adequate for Discharge   Problem: Fluid Volume: Goal: Ability to maintain a balanced intake and output will improve Outcome: Adequate for Discharge   Problem: Health Behavior/Discharge Planning: Goal: Ability to identify and utilize available resources and services will improve Outcome: Adequate for Discharge Goal: Ability to manage health-related needs will improve Outcome: Adequate for Discharge   Problem: Metabolic: Goal: Ability to maintain appropriate glucose levels will improve Outcome: Adequate for Discharge   Problem: Nutritional: Goal: Maintenance of adequate nutrition will improve Outcome: Adequate for Discharge Goal: Progress toward achieving an optimal weight will improve Outcome: Adequate for Discharge   Problem: Skin Integrity: Goal: Risk for impaired skin integrity will decrease Outcome: Adequate for Discharge   Problem: Tissue Perfusion: Goal: Adequacy of tissue perfusion will improve Outcome: Adequate for Discharge   Problem: Education: Goal: Knowledge of General Education information will improve Description: Including pain rating scale, medication(s)/side effects and non-pharmacologic comfort measures Outcome: Adequate for Discharge   Problem: Health Behavior/Discharge Planning: Goal: Ability to manage health-related needs will improve Outcome: Adequate for Discharge   Problem: Clinical Measurements: Goal: Ability to maintain clinical measurements within normal limits will improve Outcome: Adequate for Discharge Goal: Will remain free from infection Outcome: Adequate for Discharge Goal:  Diagnostic test results will improve Outcome: Adequate for Discharge Goal: Respiratory complications will improve Outcome: Adequate for Discharge Goal: Cardiovascular complication will be avoided Outcome: Adequate for Discharge   Problem: Activity: Goal: Risk for activity intolerance will decrease Outcome: Adequate for Discharge   Problem: Nutrition: Goal: Adequate nutrition will be maintained Outcome: Adequate for Discharge   Problem: Coping: Goal: Level of anxiety will decrease Outcome: Adequate for Discharge   Problem: Elimination: Goal: Will not experience complications related to bowel motility Outcome: Adequate for Discharge Goal: Will not experience complications related to urinary retention Outcome: Adequate for Discharge   Problem: Pain Managment: Goal: General experience of comfort will improve and/or be controlled Outcome: Adequate for Discharge   Problem: Safety: Goal: Ability to remain free from injury will improve Outcome: Adequate for Discharge   Problem: Skin Integrity: Goal: Risk for impaired skin integrity will decrease Outcome: Adequate for Discharge   Problem: SLP Dysphagia Goals Goal: Patient will utilize recommended strategies Description: Patient will utilize recommended strategies during swallow to increase swallowing safety with Outcome: Adequate for Discharge   Problem: Increased Nutrient Needs (NI-5.1) Goal: Food and/or nutrient delivery Description: Individualized approach for food/nutrient provision. Outcome: Adequate for Discharge   Problem: Acute Rehab PT Goals(only PT should resolve) Goal: Pt Will Go Sit To Supine/Side Outcome: Adequate for Discharge Goal: Pt Will Transfer Bed To Chair/Chair To Bed Outcome: Adequate for Discharge Goal: Pt Will Ambulate Outcome: Adequate for Discharge   Problem: Acute Rehab OT Goals (only OT should resolve) Goal: Pt. Will Perform Grooming Outcome: Adequate for Discharge Goal: Pt. Will Perform  Lower Body Dressing Outcome: Adequate for Discharge Goal: Pt. Will Transfer To Toilet Outcome: Adequate for Discharge Goal: Pt. Will Perform Toileting-Clothing Manipulation Outcome: Adequate for Discharge

## 2023-10-26 NOTE — Discharge Summary (Signed)
 Physician Discharge Summary   Patient: [name] MRN: [id number] DOB: 30-Jun-1951  Admit date:     10/21/2023  Discharge date: 10/26/23  Discharge Physician: Jonah Blue   PCP: [facility]   Recommendations at discharge:   Continue to take lactulose up to 3 times a day; decrease frequency if needed for more than 2-3 soft stools per day Home health PT and RN has been ordered Dysphagia 2 diet with thin liquids Follow up with PCP in 1-2 weeks   Discharge Diagnoses: Principal Problem:   Hepatic encephalopathy (HCC) Active Problems:   Essential hypertension   CAD (coronary artery disease)   Myocardial injury   Pancytopenia (HCC)   Liver cirrhosis (HCC)   Hyperlipidemia   COPD (chronic obstructive pulmonary disease) (HCC)   Type II diabetes mellitus with renal manifestations (HCC)   Chronic kidney disease, stage 3a (HCC)   Chronic diastolic CHF (congestive heart failure) (HCC)   Depression   Hospital Course: 73yo with h/o depression/anxiety, HTN, DM, COPD, CAD, chronic diastolic CHF, and 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  He was started on lactulose retention enemas -> PO lactulose.  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 Presented with acute onset of AMS, thought to be related to hepatic encephalopathy Ammonia 185 on admission, down to 25 He appears to be back to baseline Has rectal tube and getting PR lactulose; will change to PO and titrate to 2-3 soft stools/day It is important to maintain dietary protein intake of 1.2-1.5g/kg/day   Cirrhosis MELD 3.0 score is 14 Continue spironolactone, Rifaximin   HTN  Resume losartan, spironolactone   Chronic diastolic CHF 2D echo on 12/24/2022 showed EF 60 to 65% Appears to be compensated Resume Lasix    COPD (chronic obstructive  pulmonary disease) As needed bronchodilators   MDD (major depressive disorder) Continue citalopram and Seroquel   HLD (hyperlipidemia) Resume Lipitor   Diabetes mellitus without complication  Recent A1c 4.9, well-controlled No longer on home medications Does not need ongoing monitoring at this time   Pancytopenia Chronic issue Hemoglobin stable   CAD (coronary artery disease) Troponin 22, 26, 27 - negative delta No chest pain Likely demand ischemia   Chronic kidney disease, stage 3a  Recent baseline creatinine 1.2-1.4 Appears to be stable Avoid nephrotoxic medications where possible   Nutrition Status Nutrition Problem: Increased nutrient needs Etiology: acute illness (hepatic encepahlopathy) Signs/Symptoms: estimated needs Interventions: Ensure Enlive (each supplement provides 350kcal and 20 grams of protein), Magic cup, MVI   DNR Confirmed on admission [facility] documents reviewed       Consultants: Nutrition   Procedures: None   Antibiotics: None    Pain control - [place] Controlled Substance Reporting System database was reviewed. and patient was instructed, not to drive, operate heavy machinery, perform activities at heights, swimming or participation in water activities or provide baby-sitting services while on Pain, Sleep and Anxiety Medications; until their outpatient Physician has advised to do so again. Also recommended to not to take more than prescribed Pain, Sleep and Anxiety Medications.   Disposition: Group home Diet recommendation:  Cardiac and Carb modified diet DISCHARGE MEDICATION: Allergies as of 10/26/2023       Reactions   Bee Venom Anaphylaxis   Codeine Itching   Only itching. Recently allergy tested at [facility]   Rsv Mrna Pre-f Virus Vaccine  Swelling   Meloxicam    Other Reaction(s): ?overdose of local given at dentist--had trouble breathing   Codeine    Novocain [procaine]    Sulfa Antibiotics Other (See Comments)   Sulfa  Antibiotics    hallucinations        Medication List     TAKE these medications    acetaminophen 650 MG CR tablet Commonly known as: TYLENOL Take 650 mg by mouth every 8 (eight) hours as needed for pain.   atorvastatin 40 MG tablet Commonly known as: LIPITOR Take 40 mg by mouth daily.   citalopram 20 MG tablet Commonly known as: CELEXA Take 20 mg by mouth daily.   EYE MULTIVITAMIN PO Take 1 tablet by mouth daily.   feeding supplement Liqd Take 237 mLs by mouth 3 (three) times daily between meals.   furosemide 20 MG tablet Commonly known as: LASIX Take 1 tablet (20 mg total) by mouth daily. What changed: Another medication with the same name was removed. Continue taking this medication, and follow the directions you see here.   gabapentin 300 MG capsule Commonly known as: NEURONTIN Take 300 mg by mouth 2 (two) times daily.   lactulose 10 GM/15ML solution Commonly known as: CHRONULAC Take 45 mLs (30 g total) by mouth 3 (three) times daily.   losartan 50 MG tablet Commonly known as: COZAAR Take 50 mg by mouth daily.   methocarbamol 500 MG tablet Commonly known as: ROBAXIN Take 1 tablet (500 mg total) by mouth every 6 (six) hours as needed for muscle spasms.   multivitamin with minerals Tabs tablet Take 1 tablet by mouth daily.   nitroGLYCERIN 0.4 MG/SPRAY spray Commonly known as: NITROLINGUAL Place 1 spray under the tongue every 5 (five) minutes x 3 doses as needed for chest pain. What changed: Another medication with the same name was removed. Continue taking this medication, and follow the directions you see here.   ondansetron 4 MG tablet Commonly known as: ZOFRAN Take 1 tablet (4 mg total) by mouth every 6 (six) hours as needed for nausea.   pantoprazole 40 MG tablet Commonly known as: PROTONIX Take 40 mg by mouth daily.   phenol 1.4 % Liqd Commonly known as: CHLORASEPTIC Use as directed 1 spray in the mouth or throat as needed for throat  irritation / pain.   QUEtiapine 25 MG tablet Commonly known as: SEROQUEL Take 25 mg by mouth at bedtime as needed (Agitation/sleep).   senna 8.6 MG Tabs tablet Commonly known as: SENOKOT Take 1 tablet (8.6 mg total) by mouth 2 (two) times daily as needed for mild constipation.   spironolactone 25 MG tablet Commonly known as: ALDACTONE Take 25 mg by mouth daily.   Ventolin HFA 108 (90 Base) MCG/ACT inhaler Generic drug: albuterol Inhale 2 puffs into the lungs every 4 (four) hours as needed for wheezing.   albuterol (2.5 MG/3ML) 0.083% nebulizer solution Commonly known as: PROVENTIL Take 2.5 mg by nebulization every 4 (four) hours as needed for wheezing or shortness of breath.   Xifaxan 550 MG Tabs tablet Generic drug: rifaximin Take 550 mg by mouth 2 (two) times daily.        Follow-up Information     [facility] Follow up.   Why: Hospital follow up Contact information: [address] [place] [address] [phone]                Discharge Exam:    Subjective: Feels better, not confused.  Knows that he was  trapped in a submarine when he was in [place] but that was years ago.   Objective: Vitals:   10/25/23 1954 10/26/23 0340  BP: (!) 162/65 (!) 174/69  Pulse: 86   Resp: 18 18  Temp: 99 F (37.2 C) 97.9 F (36.6 C)  SpO2: 100% 100%    Intake/Output Summary (Last 24 hours) at 10/26/2023 0736 Last data filed at 10/25/2023 2111 Gross per 24 hour  Intake 240 ml  Output 1 ml  Net 239 ml   Filed Weights   10/23/23 0441 10/24/23 0500 10/26/23 0500  Weight: 82.5 kg 83 kg 81.1 kg    Exam:  General:  Appears calm and comfortable and is in NAD, up in bedside chair Eyes:  EOMI, normal lids, iris ENT:  grossly normal hearing, lips & tongue, mmm Neck:  no LAD, masses or thyromegaly Cardiovascular:  RRR, no m/r/g. No LE edema.  Respiratory:   CTA bilaterally with no wheezes/rales/rhonchi.  Normal respiratory effort. Abdomen:   soft, NT, ND Skin:  no rash or induration seen on limited exam Musculoskeletal:  grossly normal tone BUE/BLE, good ROM, no bony abnormality Psychiatric:  blunted mood and affect, speech sparse but appropriate Neurologic:  CN 2-12 grossly intact, moves all extremities in coordinated fashion  Data Reviewed: I have reviewed the patient's lab results since admission.  Pertinent labs for today include:   None today    Condition at discharge: stable  The results of significant diagnostics from this hospitalization (including imaging, microbiology, ancillary and laboratory) are listed below for reference.   Imaging Studies: CT Head Wo Contrast Result Date: 10/21/2023 CLINICAL DATA:  Mental status change, unknown cause. EXAM: CT HEAD WITHOUT CONTRAST TECHNIQUE: Contiguous axial images were obtained from the base of the skull through the vertex without intravenous contrast. RADIATION DOSE REDUCTION: This exam was performed according to the departmental dose-optimization program which includes automated exposure control, adjustment of the mA and/or kV according to patient size and/or use of iterative reconstruction technique. COMPARISON:  CT head without contrast 09/30/2023 FINDINGS: Brain: No acute infarct, hemorrhage, or mass lesion is present. Mild atrophy and white matter changes are similar the prior exam. The ventricles are of normal size. No significant extraaxial fluid collection is present. The brainstem and cerebellum are within normal limits. Midline structures are within normal limits. Vascular: No hyperdense vessel or unexpected calcification. Skull: Calvarium is intact. No focal lytic or blastic lesions are present. No significant extracranial soft tissue lesion is present. Sinuses/Orbits: The paranasal sinuses and mastoid air cells are clear. The globes and orbits are within normal limits. IMPRESSION: 1. No acute intracranial abnormality or significant interval change. 2. Mild atrophy and  white matter disease likely reflects the sequela of chronic microvascular ischemia. Electronically Signed   By: Marin Roberts M.D.   On: 10/21/2023 19:40   DG Chest Port 1 View Result Date: 09/30/2023 CLINICAL DATA:  Altered mental status EXAM: PORTABLE CHEST 1 VIEW COMPARISON:  Chest x-ray 07/29/2023 FINDINGS: The heart size and mediastinal contours are within normal limits. Both lungs are clear. Cervical spinal fusion plate is present. No acute fractures are seen. IMPRESSION: No active disease. Electronically Signed   By: Darliss Cheney M.D.   On: 09/30/2023 15:34   CT Head Wo Contrast Result Date: 09/30/2023 CLINICAL DATA:  Mental status change, unknown cause EXAM: CT HEAD WITHOUT CONTRAST TECHNIQUE: Contiguous axial images were obtained from the base of the skull through the vertex without intravenous contrast. RADIATION DOSE REDUCTION: This exam was performed  according to the departmental dose-optimization program which includes automated exposure control, adjustment of the mA and/or kV according to patient size and/or use of iterative reconstruction technique. COMPARISON:  None Available. FINDINGS: Brain: No evidence of large-territorial acute infarction. No parenchymal hemorrhage. No mass lesion. No extra-axial collection. No mass effect or midline shift. No hydrocephalus. Basilar cisterns are patent. Vascular: No hyperdense vessel. Skull: No acute fracture or focal lesion. Sinuses/Orbits: Paranasal sinuses and mastoid air cells are clear. The orbits are unremarkable. Other: None. IMPRESSION: No acute intracranial abnormality. Electronically Signed   By: Tish Frederickson M.D.   On: 09/30/2023 09:08    Microbiology: Results for orders placed or performed during the hospital encounter of 10/21/23  Resp panel by RT-PCR (RSV, Flu A&B, Covid) Anterior Nasal Swab     Status: None   Collection Time: 10/21/23  5:40 PM   Specimen: Anterior Nasal Swab  Result Value Ref Range Status   SARS Coronavirus  2 by RT PCR NEGATIVE NEGATIVE Final    Comment: (NOTE) SARS-CoV-2 target nucleic acids are NOT DETECTED.  The SARS-CoV-2 RNA is generally detectable in upper respiratory specimens during the acute phase of infection. The lowest concentration of SARS-CoV-2 viral copies this assay can detect is 138 copies/mL. A negative result does not preclude SARS-Cov-2 infection and should not be used as the sole basis for treatment or other patient management decisions. A negative result may occur with  improper specimen collection/handling, submission of specimen other than nasopharyngeal swab, presence of viral mutation(s) within the areas targeted by this assay, and inadequate number of viral copies(<138 copies/mL). A negative result must be combined with clinical observations, patient history, and epidemiological information. The expected result is Negative.  Fact Sheet for Patients:  BloggerCourse.com  Fact Sheet for Healthcare Providers:  SeriousBroker.it  This test is no t yet approved or cleared by the [place] FDA and  has been authorized for detection and/or diagnosis of SARS-CoV-2 by FDA under an Emergency Use Authorization (EUA). This EUA will remain  in effect (meaning this test can be used) for the duration of the COVID-19 declaration under Section 564(b)(1) of the Act, 21 U.S.C.section 360bbb-3(b)(1), unless the authorization is terminated  or revoked sooner.       Influenza A by PCR NEGATIVE NEGATIVE Final   Influenza B by PCR NEGATIVE NEGATIVE Final    Comment: (NOTE) The Xpert Xpress SARS-CoV-2/FLU/RSV plus assay is intended as an aid in the diagnosis of influenza from Nasopharyngeal swab specimens and should not be used as a sole basis for treatment. Nasal washings and aspirates are unacceptable for Xpert Xpress SARS-CoV-2/FLU/RSV testing.  Fact Sheet for Patients: BloggerCourse.com  Fact  Sheet for Healthcare Providers: SeriousBroker.it  This test is not yet approved or cleared by the [place] FDA and has been authorized for detection and/or diagnosis of SARS-CoV-2 by FDA under an Emergency Use Authorization (EUA). This EUA will remain in effect (meaning this test can be used) for the duration of the COVID-19 declaration under Section 564(b)(1) of the Act, 21 U.S.C. section 360bbb-3(b)(1), unless the authorization is terminated or revoked.     Resp Syncytial Virus by PCR NEGATIVE NEGATIVE Final    Comment: (NOTE) Fact Sheet for Patients: BloggerCourse.com  Fact Sheet for Healthcare Providers: SeriousBroker.it  This test is not yet approved or cleared by the [place] FDA and has been authorized for detection and/or diagnosis of SARS-CoV-2 by FDA under an Emergency Use Authorization (EUA). This EUA will remain in effect (meaning this test  can be used) for the duration of the COVID-19 declaration under Section 564(b)(1) of the Act, 21 U.S.C. section 360bbb-3(b)(1), unless the authorization is terminated or revoked.  Performed at [facility], [address] [place], [place] [address]     Labs: CBC: Recent Labs  Lab 10/21/23 1842 10/22/23 0507 10/23/23 0831 10/24/23 0532 10/25/23 0526  WBC 2.3* 2.6* 3.4* 3.8* 4.4  NEUTROABS 1.1*  --  2.4 2.4 2.8  HGB 11.2* 10.3* 10.7* 10.1* 10.3*  HCT 31.6* 29.9* 31.2* 28.5* 30.0*  MCV 89.0 89.3 91.2 89.6 91.5  PLT 96* 92* 85* 84* 83*   Basic Metabolic Panel: Recent Labs  Lab 10/21/23 1842 10/22/23 0507 10/23/23 0831 10/24/23 0532 10/24/23 1512  NA 142 142 142 137 139  K 3.7 3.4* 3.3* 3.5 3.5  CL 111 115* 115* 111 112*  CO2 22 20* 20* 18* 20*  GLUCOSE 101* 88 109* 82 89  BUN 19 19 17 14 14   CREATININE 1.44* 1.26* 1.02 0.99 0.95  CALCIUM 10.1 9.5 9.6 9.5 9.6   Liver Function Tests: Recent Labs  Lab  10/21/23 1842 10/23/23 0831 10/24/23 1512  AST 41 47* 50*  ALT 24 25 29   ALKPHOS 75 55 55  BILITOT 1.4* 1.8* 2.3*  PROT 6.2* 6.5 6.5  ALBUMIN 2.6* 2.7* 2.7*   CBG: Recent Labs  Lab 10/23/23 2135 10/24/23 0834 10/24/23 1214 10/24/23 1610 10/24/23 1947  GLUCAP 90 82 111* 81 98    Discharge time spent: less than 30 minutes.  Signed: Jonah Blue, MD [facility] 10/26/2023

## 2023-10-29 ENCOUNTER — Telehealth: Payer: Self-pay | Admitting: Family

## 2023-10-29 NOTE — Progress Notes (Unsigned)
 Advanced [facility] Failure Clinic Note    PCP: Laurian Brim (last seen ~ 2 weeks ago) Primary Cardiologist: Julien Nordmann, MD (last seen 02/22)  Chief Complaint: shortness of breath   HPI:  [name] is a 73 y/o male with a history of DM, HTN, COPD, migraines, hepatitis C, w/ likely underlying HCC, cirrhosis, TIA, depression, anxiety obstructive sleep apnea, cervical stenosis status post C3-6 ACDF and chronic heart failure.  Admitted 06/17/23 due to dysuria, was found to have E. coli bacteremia. Having some falls, patient having abdominal pain and weight loss. Abd/ pelvic CT stable. MRI of cervical spine shows C4-5 severe spinal stenosis with cord compression. Neurosurgery consulted. Antibiotics provided. Admitted 07/01/23 due to cervical myelopathy and cervical stenosis status post C3-6 ACDF. He was admitted overnight for pain control, therapy evaluation, and drain output monitoring. His postoperative course was complicated by worsening thrombocytopenia which improved back to his baseline with 1 unit of platelets. His drain output reduced to an acceptable level and was removed on the afternoon of postop day 1 and discharged the following day.   Admitted 07/29/23 due to AMS and a fall. Had not been taking his lactulose due to diarrhea & generalized weakness. CXR normal. Head CT normal. Palliative care consulted due to multiple comorbidities. PT consulted. Mental status improved and discharged to group home. 2 December ED visits due to weakness/ tremors.   Echo 03/20/18: EF of 60-65% along with mild/moderate Drew and a mildly elevated PA pressure of 42 mm Hg Echo 04/01/21: EF of 55-60% along with mild LVH/ LAE. Echo 03/25/22: EF 60-65% Echo 12/24/22: EF 60-65% along with mild LVH, mild LAE.    He presents today for a HF follow-up visit with a chief complaint of minimal shortness of breath with moderate exertion. Chronic in nature. Has associated fatigue, back pain, chest pain, neck pain  and back pain along with this. Denies palpitations, cough, abdominal distention, pedal edema, dizziness, weight gain or difficulty sleeping. When he experiences chest pain, he will take a SL NTG with relief.   BP at group home has been running high with SBP up to low 190's.   ROS: All systems negative except as listed in HPI, PMH and Problem List.  SH:  Social History   Socioeconomic History   Marital status: Single    Spouse name: Not on file   Number of children: 2   Years of education: 12   Highest education level: 12th grade  Occupational History   Occupation: retired  Tobacco Use   Smoking status: Every Day    Types: Cigarettes   Smokeless tobacco: Never  Vaping Use   Vaping status: Not on file  Substance and Sexual Activity   Alcohol use: Not Currently   Drug use: Not Currently   Sexual activity: Not Currently    Birth control/protection: None  Other Topics Concern   Not on file  Social History Narrative   ** Merged History Encounter **       Lives in [facility] care home   Social Drivers of Health   Financial Resource Strain: High Risk (04/27/2022)   Overall Financial Resource Strain (CARDIA)    Difficulty of Paying Living Expenses: Very hard  Food Insecurity: Patient Unable To Answer (10/22/2023)   Hunger Vital Sign    Worried About Running Out of Food in the Last Year: Patient unable to answer    Ran Out of Food in the Last Year: Patient unable to answer  Recent Concern: Food Insecurity -  Food Insecurity Present (07/30/2023)   Hunger Vital Sign    Worried About Running Out of Food in the Last Year: Sometimes true    Ran Out of Food in the Last Year: Sometimes true  Transportation Needs: Patient Unable To Answer (10/22/2023)   PRAPARE - Transportation    Lack of Transportation (Medical): Patient unable to answer    Lack of Transportation (Non-Medical): Patient unable to answer  Physical Activity: Sufficiently Active (04/16/2018)   Exercise Vital Sign    Days of  Exercise per Week: 7 days    Minutes of Exercise per Session: 30 min  Stress: Stress Concern Present (04/16/2018)   [employer] of Occupational Health - Occupational Stress Questionnaire    Feeling of Stress : Very much  Social Connections: Patient Unable To Answer (10/22/2023)   Social Connection and Isolation Panel [NHANES]    Frequency of Communication with Friends and Family: Patient unable to answer    Frequency of Social Gatherings with Friends and Family: Patient unable to answer    Attends Religious Services: Patient unable to answer    Active Member of Clubs or Organizations: Patient unable to answer    Attends Banker Meetings: Patient unable to answer    Marital Status: Patient unable to answer  Intimate Partner Violence: Patient Unable To Answer (10/22/2023)   [name], Afraid, [name], and Kick questionnaire    Fear of Current or Ex-Partner: Patient unable to answer    Emotionally Abused: Patient unable to answer    Physically Abused: Patient unable to answer    Sexually Abused: Patient unable to answer    FH:  Family History  Problem Relation Age of Onset   Hypertension Mother    Heart disease Mother    Heart failure Maternal Grandmother     Past Medical History:  Diagnosis Date   (HFpEF) heart failure with preserved ejection fraction (HCC)    a.) TTE 03/20/2018: EF 60-65%, no RWMAs, mild LAE, mild-mod Drew, PASP 42; b.) TTE 03/31/2021: EF 55-60%, no RWMAs, mild LVH, mild LAE, AoV sclerosis without stenosis; c.) TTE 03/25/2022: EF 60-65%, no RWMAs, AoV sclerosis without stenosis; d.) TTE 12/24/2022: EF 60-65%, no RWMAs, mild LVH, mild LAE.   Anasarca    Anemia    Anxiety    Aortic atherosclerosis (HCC)    Arthritis    Asthma    Bacteremia due to Escherichia coli 05/2023   Bacteremia due to Klebsiella pneumoniae 2023   Bilateral carotid artery disease (HCC)    Bradycardia    CAD (coronary artery disease) 10/21/2006   a.) MV 10/21/2006: small  reversible defect involving the apical  lateral wall c/w possible ischemia; b.) MV: 03/24/2018: no ischemia; c.) MV 10/04/2021: LAD calcifications   CAD (coronary artery disease)    Cervical radiculopathy    Chest pain    a.) MV 03/21/2018: no ischemia; b.) MV 10/04/2021: no ischemia   Chronic back pain    Chronic common bile duct dilatation    Cirrhosis (HCC)    a.) (+) hepatic lesion on CT; likely hepatocellular carcinoma; b.) CT evidence of associated portal hypertension; c.) (+) abdominopelvic anasarca   COPD (chronic obstructive pulmonary disease) (HCC)    DDD (degenerative disc disease), lumbar    Depression    Frequent falls    GERD (gastroesophageal reflux disease)    HBV (hepatitis B virus) infection    HCV (hepatitis C virus)    Hernia of anterior abdominal wall    History of 2019 novel coronavirus  disease (COVID-19) 04/16/2020   Homelessness    a.) limited on placement as patient is listed on [place] sex offender listing for exploitation of a minor (possession of child pornography); incarcerated 12 years (released 11/2017)   Hyperlipidemia    Hypertension    Infestation by bed bug 02/2022   Malingering    Migraine    Nephrolithiasis    Obesity    Portal hypertension (HCC)    PTSD (post-traumatic stress disorder)    a.) brief training in the [employer] at age 89; someone was killed in front of him   Pulmonary HTN (HCC)    a.) multiple CT scans desmontrating dilated PA c/w pHTN; b.) TTE 03/20/2018: PASP 42 mmHg   Sleep apnea    a.) no nocturnal PAP therapy   Sleep apnea    Stenosis of cervical spine with myelopathy (HCC)    Suicidal ideations    T2DM (type 2 diabetes mellitus) (HCC)    Thrombocytopenia (HCC)    a.) related to hepatic cirrhosis; spleen normal in size   TIA (transient ischemic attack)    TIA (transient ischemic attack) 09/30/2023    Current Outpatient Medications  Medication Sig Dispense Refill   acetaminophen (TYLENOL) 650 MG CR tablet Take 650 mg by mouth  every 8 (eight) hours as needed for pain.     albuterol (PROVENTIL) (2.5 MG/3ML) 0.083% nebulizer solution Take 2.5 mg by nebulization every 4 (four) hours as needed for wheezing or shortness of breath.     atorvastatin (LIPITOR) 40 MG tablet Take 40 mg by mouth daily.     citalopram (CELEXA) 20 MG tablet Take 20 mg by mouth daily.     feeding supplement (ENSURE ENLIVE / ENSURE PLUS) LIQD Take 237 mLs by mouth 3 (three) times daily between meals. 237 mL 12   furosemide (LASIX) 20 MG tablet Take 1 tablet (20 mg total) by mouth daily. 30 tablet 0   gabapentin (NEURONTIN) 300 MG capsule Take 300 mg by mouth 2 (two) times daily.     lactulose (CHRONULAC) 10 GM/15ML solution Take 45 mLs (30 g total) by mouth 3 (three) times daily. 236 mL 0   losartan (COZAAR) 50 MG tablet Take 50 mg by mouth daily.     methocarbamol (ROBAXIN) 500 MG tablet Take 1 tablet (500 mg total) by mouth every 6 (six) hours as needed for muscle spasms. 120 tablet 0   Multiple Vitamin (MULTIVITAMIN WITH MINERALS) TABS tablet Take 1 tablet by mouth daily.     Multiple Vitamins-Minerals (EYE MULTIVITAMIN PO) Take 1 tablet by mouth daily.     nitroGLYCERIN (NITROLINGUAL) 0.4 MG/SPRAY spray Place 1 spray under the tongue every 5 (five) minutes x 3 doses as needed for chest pain.     ondansetron (ZOFRAN) 4 MG tablet Take 1 tablet (4 mg total) by mouth every 6 (six) hours as needed for nausea. 20 tablet 0   pantoprazole (PROTONIX) 40 MG tablet Take 40 mg by mouth daily.     phenol (CHLORASEPTIC) 1.4 % LIQD Use as directed 1 spray in the mouth or throat as needed for throat irritation / pain.     QUEtiapine (SEROQUEL) 25 MG tablet Take 25 mg by mouth at bedtime as needed (Agitation/sleep).     senna (SENOKOT) 8.6 MG TABS tablet Take 1 tablet (8.6 mg total) by mouth 2 (two) times daily as needed for mild constipation. 60 tablet 0   spironolactone (ALDACTONE) 25 MG tablet Take 25 mg by mouth daily.  VENTOLIN HFA 108 (90 Base) MCG/ACT  inhaler Inhale 2 puffs into the lungs every 4 (four) hours as needed for wheezing.     XIFAXAN 550 MG TABS tablet Take 550 mg by mouth 2 (two) times daily.     No current facility-administered medications for this visit.   There were no vitals filed for this visit.  Wt Readings from Last 3 Encounters:  10/26/23 178 lb 12.7 oz (81.1 kg)  09/30/23 170 lb (77.1 kg)  08/27/23 192 lb (87.1 kg)   Lab Results  Component Value Date   CREATININE 0.95 10/24/2023   CREATININE 0.99 10/24/2023   CREATININE 1.02 10/23/2023     PHYSICAL EXAM:  General:  Well appearing. No resp difficulty HEENT: normal Neck: supple. JVP flat. No lymphadenopathy. Cor: PMI normal. Regular rhythm. Bradycardic. No rubs, gallops or murmurs. Lungs: clear Abdomen: soft, nontender, nondistended. No hepatosplenomegaly. No bruits or masses.  Extremities: no cyanosis, clubbing, rash, trace pitting edema bilateral lower legs Neuro: alert & oriented x3, cranial nerves grossly intact. Moves all 4 extremities w/o difficulty. Affect pleasant.   ECG: not done   ASSESSMENT & PLAN:  1: NICM with preserved ejection fraction- - likely due to HTN - NYHA class II - euvolemic - being weighed daily at the group home; reminded to call for overnight weight gain of > 3 pounds or weekly weight gain of > 5 pounds - weight down 7 pounds from last visit here 6 weeks ago  - Echo 03/20/18: EF of 60-65% along with mild/moderate Drew and a mildly elevated PA pressure of 42 mm Hg - Echo 04/01/21: EF of 55-60% along with mild LVH/ LAE. - Echo 03/25/22: EF 60-65% - Echo 12/24/22: EF 60-65% along with mild LVH, mild LAE.   - continue jardiance 10mg  daily - continue furosemide 20mg  daily - continue losartan 50mg  daily - increase spironolactone to 25mg  daily - BMET in 2 weeks - had telemedicine visit with cardiology Mariah Milling) 02/22 - BNP 08/22/23 was 252.4  2: HTN- - BP 164/60 - increasing spironolactone per above - sees PCP at [facility] Allena Katz)  - BMP 08/22/23 reviewed and showed sodium 139, potassium 3.6, creatinine 1.4 and GFR 53 - BMET next visit  3: DM- - A1c 06/27/23 was 5.0%  4: Liver cirrhosis- - likely secondary hepatitis C w/ positive viral load in 2019 - acute viral panel 02/24 demonstrated positive for hepatitis B antigen.  - needs to get established with GI  5: Cervical myelopathy- - status post C3-6 ACDF 11/24 - saw neurosurgery Myer Haff) 12/24   Return in 2 weeks, sooner if needed.      Delma Freeze, FNP 10/29/23

## 2023-10-29 NOTE — Telephone Encounter (Signed)
 Pt confirmed appt for 10/30/23

## 2023-10-30 ENCOUNTER — Ambulatory Visit (HOSPITAL_BASED_OUTPATIENT_CLINIC_OR_DEPARTMENT_OTHER): Payer: 59 | Admitting: Family

## 2023-10-30 ENCOUNTER — Other Ambulatory Visit
Admission: RE | Admit: 2023-10-30 | Discharge: 2023-10-30 | Disposition: A | Discharge status: Home / Self Care | Source: Ambulatory Visit | Attending: Family | Admitting: Family

## 2023-10-30 ENCOUNTER — Encounter: Payer: Self-pay | Admitting: Family

## 2023-10-30 VITALS — BP 106/62 | HR 48

## 2023-10-30 DIAGNOSIS — I10 Essential (primary) hypertension: Secondary | ICD-10-CM | POA: Diagnosis not present

## 2023-10-30 DIAGNOSIS — I5032 Chronic diastolic (congestive) heart failure: Secondary | ICD-10-CM | POA: Insufficient documentation

## 2023-10-30 DIAGNOSIS — R188 Other ascites: Secondary | ICD-10-CM

## 2023-10-30 DIAGNOSIS — I498 Other specified cardiac arrhythmias: Secondary | ICD-10-CM

## 2023-10-30 DIAGNOSIS — E119 Type 2 diabetes mellitus without complications: Secondary | ICD-10-CM

## 2023-10-30 DIAGNOSIS — K7460 Unspecified cirrhosis of liver: Secondary | ICD-10-CM

## 2023-10-30 DIAGNOSIS — R4182 Altered mental status, unspecified: Secondary | ICD-10-CM | POA: Diagnosis not present

## 2023-10-30 DIAGNOSIS — K7682 Hepatic encephalopathy: Secondary | ICD-10-CM | POA: Diagnosis not present

## 2023-10-30 LAB — MAGNESIUM: Magnesium: 1.8 mg/dL (ref 1.7–2.4)

## 2023-10-30 LAB — BASIC METABOLIC PANEL
Anion gap: 6 (ref 5–15)
BUN: 21 mg/dL (ref 8–23)
CO2: 25 mmol/L (ref 22–32)
Calcium: 10 mg/dL (ref 8.9–10.3)
Chloride: 112 mmol/L — ABNORMAL HIGH (ref 98–111)
Creatinine, Ser: 1.3 mg/dL — ABNORMAL HIGH (ref 0.61–1.24)
GFR, Estimated: 58 mL/min — ABNORMAL LOW (ref >60)
Glucose, Bld: 63 mg/dL — ABNORMAL LOW (ref 70–99)
Potassium: 3.6 mmol/L (ref 3.5–5.1)
Sodium: 143 mmol/L (ref 135–145)

## 2023-10-30 MED ORDER — NITROGLYCERIN 0.4 MG/SPRAY TL SOLN: 1.0000 | 5 refills | Status: AC | PRN

## 2023-10-30 NOTE — Patient Instructions (Signed)
 Medication Changes:  No medication changes  Lab Work:  Go over to the [facility]. Go pass the gift shop and have your blood work completed.  We will only call you if the results are abnormal or if the provider would like to make medication changes.   Referrals:  We have placed 2 referrals today to General Cardiology and EP Cardiology for your Junctional Rhythm. They should reach out to you within 1-2 weeks. If they do not, please reach out to them. Their information is below on this AVS.   Follow-Up in: 3 months with Clarisa Kindred, FNP  At the [facility], you and your health needs are our priority. We have a designated team specialized in the treatment of Heart Failure. This Care Team includes your primary Heart Failure Specialized Cardiologist (physician), Advanced Practice Providers (APPs- Physician Assistants and Nurse Practitioners), and Pharmacist who all work together to provide you with the care you need, when you need it.   You may see any of the following providers on your designated Care Team at your next follow up:  Dr. Arvilla Meres Dr. Marca Ancona Dr. Dorthula Nettles Dr. Theresia Bough Clarisa Kindred, FNP Enos Fling, RPH-CPP  Please be sure to bring in all your medications bottles to every appointment.   Need to Contact us:  If you have any questions or concerns before your next appointment please send [place] a message through [place] or call our office at [phone].    TO LEAVE A MESSAGE FOR THE NURSE SELECT OPTION 2, PLEASE LEAVE A MESSAGE INCLUDING: YOUR NAME DATE OF BIRTH CALL BACK NUMBER REASON FOR CALL**this is important as we prioritize the call backs  YOU WILL RECEIVE A CALL BACK THE SAME DAY AS LONG AS YOU CALL BEFORE 4:00 PM

## 2023-10-31 ENCOUNTER — Emergency Department

## 2023-10-31 ENCOUNTER — Encounter: Payer: Self-pay | Admitting: Internal Medicine

## 2023-10-31 ENCOUNTER — Observation Stay

## 2023-10-31 ENCOUNTER — Other Ambulatory Visit: Payer: Self-pay

## 2023-10-31 ENCOUNTER — Inpatient Hospital Stay
Admission: EM | Admit: 2023-10-31 | Discharge: 2023-11-07 | DRG: 442 | Disposition: A | Discharge status: Home Health | Attending: Internal Medicine | Admitting: Internal Medicine

## 2023-10-31 DIAGNOSIS — E669 Obesity, unspecified: Secondary | ICD-10-CM | POA: Diagnosis present

## 2023-10-31 DIAGNOSIS — Z8249 Family history of ischemic heart disease and other diseases of the circulatory system: Secondary | ICD-10-CM

## 2023-10-31 DIAGNOSIS — F4310 Post-traumatic stress disorder, unspecified: Secondary | ICD-10-CM | POA: Diagnosis present

## 2023-10-31 DIAGNOSIS — J449 Chronic obstructive pulmonary disease, unspecified: Secondary | ICD-10-CM | POA: Diagnosis present

## 2023-10-31 DIAGNOSIS — K7682 Hepatic encephalopathy: Principal | ICD-10-CM | POA: Diagnosis present

## 2023-10-31 DIAGNOSIS — Z5941 Food insecurity: Secondary | ICD-10-CM

## 2023-10-31 DIAGNOSIS — B1920 Unspecified viral hepatitis C without hepatic coma: Secondary | ICD-10-CM | POA: Diagnosis present

## 2023-10-31 DIAGNOSIS — Z87442 Personal history of urinary calculi: Secondary | ICD-10-CM

## 2023-10-31 DIAGNOSIS — Z5948 Other specified lack of adequate food: Secondary | ICD-10-CM

## 2023-10-31 DIAGNOSIS — Z8616 Personal history of COVID-19: Secondary | ICD-10-CM

## 2023-10-31 DIAGNOSIS — Z885 Allergy status to narcotic agent status: Secondary | ICD-10-CM

## 2023-10-31 DIAGNOSIS — I700 Atherosclerosis of aorta: Secondary | ICD-10-CM | POA: Diagnosis present

## 2023-10-31 DIAGNOSIS — Z882 Allergy status to sulfonamides status: Secondary | ICD-10-CM

## 2023-10-31 DIAGNOSIS — M4802 Spinal stenosis, cervical region: Secondary | ICD-10-CM | POA: Diagnosis present

## 2023-10-31 DIAGNOSIS — I428 Other cardiomyopathies: Secondary | ICD-10-CM | POA: Diagnosis present

## 2023-10-31 DIAGNOSIS — Z888 Allergy status to other drugs, medicaments and biological substances status: Secondary | ICD-10-CM

## 2023-10-31 DIAGNOSIS — Z91030 Bee allergy status: Secondary | ICD-10-CM

## 2023-10-31 DIAGNOSIS — I272 Pulmonary hypertension, unspecified: Secondary | ICD-10-CM | POA: Diagnosis present

## 2023-10-31 DIAGNOSIS — Z981 Arthrodesis status: Secondary | ICD-10-CM

## 2023-10-31 DIAGNOSIS — Z5986 Financial insecurity: Secondary | ICD-10-CM

## 2023-10-31 DIAGNOSIS — K766 Portal hypertension: Secondary | ICD-10-CM | POA: Diagnosis present

## 2023-10-31 DIAGNOSIS — E119 Type 2 diabetes mellitus without complications: Secondary | ICD-10-CM

## 2023-10-31 DIAGNOSIS — D61818 Other pancytopenia: Secondary | ICD-10-CM | POA: Diagnosis present

## 2023-10-31 DIAGNOSIS — Z8673 Personal history of transient ischemic attack (TIA), and cerebral infarction without residual deficits: Secondary | ICD-10-CM

## 2023-10-31 DIAGNOSIS — J4489 Other specified chronic obstructive pulmonary disease: Secondary | ICD-10-CM | POA: Diagnosis present

## 2023-10-31 DIAGNOSIS — K7460 Unspecified cirrhosis of liver: Secondary | ICD-10-CM | POA: Diagnosis present

## 2023-10-31 DIAGNOSIS — R1310 Dysphagia, unspecified: Secondary | ICD-10-CM | POA: Diagnosis present

## 2023-10-31 DIAGNOSIS — F32A Depression, unspecified: Secondary | ICD-10-CM | POA: Diagnosis present

## 2023-10-31 DIAGNOSIS — Z79899 Other long term (current) drug therapy: Secondary | ICD-10-CM

## 2023-10-31 DIAGNOSIS — F329 Major depressive disorder, single episode, unspecified: Secondary | ICD-10-CM | POA: Diagnosis present

## 2023-10-31 DIAGNOSIS — I10 Essential (primary) hypertension: Secondary | ICD-10-CM | POA: Diagnosis present

## 2023-10-31 DIAGNOSIS — M1990 Unspecified osteoarthritis, unspecified site: Secondary | ICD-10-CM | POA: Diagnosis present

## 2023-10-31 DIAGNOSIS — E1122 Type 2 diabetes mellitus with diabetic chronic kidney disease: Secondary | ICD-10-CM | POA: Diagnosis present

## 2023-10-31 DIAGNOSIS — N1831 Chronic kidney disease, stage 3a: Secondary | ICD-10-CM | POA: Diagnosis present

## 2023-10-31 DIAGNOSIS — I5032 Chronic diastolic (congestive) heart failure: Secondary | ICD-10-CM | POA: Diagnosis present

## 2023-10-31 DIAGNOSIS — I2510 Atherosclerotic heart disease of native coronary artery without angina pectoris: Secondary | ICD-10-CM | POA: Diagnosis present

## 2023-10-31 DIAGNOSIS — Z7984 Long term (current) use of oral hypoglycemic drugs: Secondary | ICD-10-CM

## 2023-10-31 DIAGNOSIS — G9341 Metabolic encephalopathy: Secondary | ICD-10-CM | POA: Diagnosis present

## 2023-10-31 DIAGNOSIS — K219 Gastro-esophageal reflux disease without esophagitis: Secondary | ICD-10-CM | POA: Diagnosis present

## 2023-10-31 DIAGNOSIS — Z91199 Patient's noncompliance with other medical treatment and regimen due to unspecified reason: Secondary | ICD-10-CM

## 2023-10-31 DIAGNOSIS — F17200 Nicotine dependence, unspecified, uncomplicated: Secondary | ICD-10-CM | POA: Diagnosis present

## 2023-10-31 DIAGNOSIS — I130 Hypertensive heart and chronic kidney disease with heart failure and stage 1 through stage 4 chronic kidney disease, or unspecified chronic kidney disease: Secondary | ICD-10-CM | POA: Diagnosis present

## 2023-10-31 DIAGNOSIS — Z66 Do not resuscitate: Secondary | ICD-10-CM | POA: Diagnosis present

## 2023-10-31 DIAGNOSIS — E785 Hyperlipidemia, unspecified: Secondary | ICD-10-CM | POA: Diagnosis present

## 2023-10-31 DIAGNOSIS — R001 Bradycardia, unspecified: Secondary | ICD-10-CM | POA: Diagnosis present

## 2023-10-31 LAB — COMPREHENSIVE METABOLIC PANEL
ALT: 31 U/L (ref 0–44)
AST: 42 U/L — ABNORMAL HIGH (ref 15–41)
Albumin: 2.7 g/dL — ABNORMAL LOW (ref 3.5–5.0)
Alkaline Phosphatase: 65 U/L (ref 38–126)
Anion gap: 7 (ref 5–15)
BUN: 18 mg/dL (ref 8–23)
CO2: 24 mmol/L (ref 22–32)
Calcium: 10.1 mg/dL (ref 8.9–10.3)
Chloride: 112 mmol/L — ABNORMAL HIGH (ref 98–111)
Creatinine, Ser: 1.26 mg/dL — ABNORMAL HIGH (ref 0.61–1.24)
GFR, Estimated: 60 mL/min — ABNORMAL LOW (ref >60)
Glucose, Bld: 93 mg/dL (ref 70–99)
Potassium: 4.1 mmol/L (ref 3.5–5.1)
Sodium: 143 mmol/L (ref 135–145)
Total Bilirubin: 1.3 mg/dL — ABNORMAL HIGH (ref 0.0–1.2)
Total Protein: 6.6 g/dL (ref 6.5–8.1)

## 2023-10-31 LAB — CBC WITH DIFFERENTIAL/PLATELET
Abs Immature Granulocytes: 0.01 10*3/uL (ref 0.00–0.07)
Basophils Absolute: 0 10*3/uL (ref 0.0–0.1)
Basophils Relative: 1 %
Eosinophils Absolute: 0.1 10*3/uL (ref 0.0–0.5)
Eosinophils Relative: 2 %
HCT: 36.7 % — ABNORMAL LOW (ref 39.0–52.0)
Hemoglobin: 12 g/dL — ABNORMAL LOW (ref 13.0–17.0)
Immature Granulocytes: 0 %
Lymphocytes Relative: 43 %
Lymphs Abs: 1 10*3/uL (ref 0.7–4.0)
MCH: 30.8 pg (ref 26.0–34.0)
MCHC: 32.7 g/dL (ref 30.0–36.0)
MCV: 94.3 fL (ref 80.0–100.0)
Monocytes Absolute: 0.3 10*3/uL (ref 0.1–1.0)
Monocytes Relative: 12 %
Neutro Abs: 1 10*3/uL — ABNORMAL LOW (ref 1.7–7.7)
Neutrophils Relative %: 42 %
Platelets: 83 10*3/uL — ABNORMAL LOW (ref 150–400)
RBC: 3.89 MIL/uL — ABNORMAL LOW (ref 4.22–5.81)
RDW: 16.2 % — ABNORMAL HIGH (ref 11.5–15.5)
WBC: 2.4 10*3/uL — ABNORMAL LOW (ref 4.0–10.5)
nRBC: 0 % (ref 0.0–0.2)

## 2023-10-31 LAB — LACTIC ACID, PLASMA
Lactic Acid, Venous: 1.7 mmol/L (ref 0.5–1.9)
Lactic Acid, Venous: 1.9 mmol/L (ref 0.5–1.9)

## 2023-10-31 LAB — URINE DRUG SCREEN, QUALITATIVE (ARMC ONLY)
Amphetamines, Ur Screen: NOT DETECTED
Barbiturates, Ur Screen: NOT DETECTED
Benzodiazepine, Ur Scrn: NOT DETECTED
Cannabinoid 50 Ng, Ur ~~LOC~~: NOT DETECTED
Cocaine Metabolite,Ur ~~LOC~~: NOT DETECTED
MDMA (Ecstasy)Ur Screen: NOT DETECTED
Methadone Scn, Ur: NOT DETECTED
Opiate, Ur Screen: NOT DETECTED
Phencyclidine (PCP) Ur S: NOT DETECTED
Tricyclic, Ur Screen: POSITIVE — AB

## 2023-10-31 LAB — URINALYSIS, ROUTINE W REFLEX MICROSCOPIC
Bilirubin Urine: NEGATIVE
Glucose, UA: NEGATIVE mg/dL
Hgb urine dipstick: NEGATIVE
Ketones, ur: NEGATIVE mg/dL
Leukocytes,Ua: NEGATIVE
Nitrite: NEGATIVE
Protein, ur: NEGATIVE mg/dL
Specific Gravity, Urine: 1.014 (ref 1.005–1.030)
pH: 8 (ref 5.0–8.0)

## 2023-10-31 LAB — SALICYLATE LEVEL: Salicylate Lvl: <7 mg/dL — ABNORMAL LOW (ref 7.0–30.0)

## 2023-10-31 LAB — RESP PANEL BY RT-PCR (RSV, FLU A&B, COVID)  RVPGX2
Influenza A by PCR: NEGATIVE
Influenza B by PCR: NEGATIVE
Resp Syncytial Virus by PCR: NEGATIVE
SARS Coronavirus 2 by RT PCR: NEGATIVE

## 2023-10-31 LAB — GLUCOSE, CAPILLARY
Glucose-Capillary: 101 mg/dL — ABNORMAL HIGH (ref 70–99)
Glucose-Capillary: 135 mg/dL — ABNORMAL HIGH (ref 70–99)
Glucose-Capillary: 87 mg/dL (ref 70–99)

## 2023-10-31 LAB — CBG MONITORING, ED: Glucose-Capillary: 79 mg/dL (ref 70–99)

## 2023-10-31 LAB — TROPONIN I (HIGH SENSITIVITY)
Troponin I (High Sensitivity): 19 ng/L — ABNORMAL HIGH (ref <18)
Troponin I (High Sensitivity): 18 ng/L — ABNORMAL HIGH (ref <18)

## 2023-10-31 LAB — AMMONIA: Ammonia: 158 umol/L — ABNORMAL HIGH (ref 9–35)

## 2023-10-31 LAB — ACETAMINOPHEN LEVEL: Acetaminophen (Tylenol), Serum: <10 ug/mL — ABNORMAL LOW (ref 10–30)

## 2023-10-31 MED ORDER — LACTULOSE 10 GM/15ML PO SOLN
30.0000 g | Freq: Once | ORAL | Status: AC
  Administered 2023-10-31: 30 g via ORAL
  Filled 2023-10-31: qty 60

## 2023-10-31 MED ORDER — FUROSEMIDE 40 MG PO TABS
20.0000 mg | ORAL_TABLET | Freq: Every day | ORAL | Status: DC
  Administered 2023-10-31 – 2023-11-01 (×2): 20 mg
  Filled 2023-10-31 (×2): qty 1

## 2023-10-31 MED ORDER — CITALOPRAM HYDROBROMIDE 20 MG PO TABS
20.0000 mg | ORAL_TABLET | Freq: Every day | ORAL | Status: DC
  Administered 2023-10-31 – 2023-11-01 (×2): 20 mg via NASOGASTRIC
  Filled 2023-10-31 (×2): qty 1

## 2023-10-31 MED ORDER — ALBUTEROL SULFATE (2.5 MG/3ML) 0.083% IN NEBU: 2.5000 mg | INHALATION_SOLUTION | RESPIRATORY_TRACT | Status: DC | PRN

## 2023-10-31 MED ORDER — ATORVASTATIN CALCIUM 20 MG PO TABS
40.0000 mg | ORAL_TABLET | Freq: Every day | ORAL | Status: DC
  Administered 2023-10-31 – 2023-11-01 (×2): 40 mg via NASOGASTRIC
  Filled 2023-10-31 (×2): qty 2

## 2023-10-31 MED ORDER — RIFAXIMIN 550 MG PO TABS
550.0000 mg | ORAL_TABLET | Freq: Two times a day (BID) | ORAL | Status: DC
Start: 2023-10-31 — End: 2023-11-01
  Administered 2023-10-31 – 2023-11-01 (×2): 550 mg
  Filled 2023-10-31 (×2): qty 1

## 2023-10-31 MED ORDER — QUETIAPINE FUMARATE 25 MG PO TABS: 25.0000 mg | ORAL_TABLET | Freq: Every evening | ORAL | Status: DC | PRN

## 2023-10-31 MED ORDER — GABAPENTIN 300 MG PO CAPS
300.0000 mg | ORAL_CAPSULE | Freq: Two times a day (BID) | ORAL | Status: DC
  Administered 2023-11-01: 300 mg
  Filled 2023-10-31: qty 1

## 2023-10-31 MED ORDER — ONDANSETRON HCL 4 MG/2ML IJ SOLN: 4.0000 mg | Freq: Four times a day (QID) | INTRAMUSCULAR | Status: DC | PRN

## 2023-10-31 MED ORDER — SPIRONOLACTONE 25 MG PO TABS
25.0000 mg | ORAL_TABLET | Freq: Every day | ORAL | Status: DC
  Administered 2023-10-31 – 2023-11-01 (×2): 25 mg via NASOGASTRIC
  Filled 2023-10-31 (×2): qty 1

## 2023-10-31 MED ORDER — HYDRALAZINE HCL 20 MG/ML IJ SOLN
5.0000 mg | Freq: Four times a day (QID) | INTRAMUSCULAR | Status: DC | PRN
Start: 2023-10-31 — End: 2023-11-07

## 2023-10-31 MED ORDER — ENSURE ENLIVE PO LIQD
237.0000 mL | Freq: Three times a day (TID) | ORAL | Status: DC
  Administered 2023-10-31 – 2023-11-01 (×2): 237 mL via NASOGASTRIC

## 2023-10-31 MED ORDER — RIFAXIMIN 550 MG PO TABS
550.0000 mg | ORAL_TABLET | Freq: Once | ORAL | Status: DC
  Filled 2023-10-31 (×2): qty 1

## 2023-10-31 MED ORDER — LACTULOSE 10 GM/15ML PO SOLN
30.0000 g | Freq: Three times a day (TID) | ORAL | Status: DC
  Administered 2023-10-31 – 2023-11-01 (×2): 30 g
  Filled 2023-10-31 (×2): qty 60

## 2023-10-31 MED ORDER — INSULIN ASPART 100 UNIT/ML IJ SOLN
0.0000 [IU] | Freq: Three times a day (TID) | INTRAMUSCULAR | Status: DC
  Administered 2023-11-02: 1 [IU] via SUBCUTANEOUS
  Administered 2023-11-03: 2 [IU] via SUBCUTANEOUS
  Administered 2023-11-06: 1 [IU] via SUBCUTANEOUS
  Filled 2023-10-31 (×3): qty 1

## 2023-10-31 MED ORDER — LOSARTAN POTASSIUM 50 MG PO TABS
50.0000 mg | ORAL_TABLET | Freq: Every day | ORAL | Status: DC
  Administered 2023-10-31 – 2023-11-01 (×2): 50 mg via NASOGASTRIC
  Filled 2023-10-31 (×2): qty 1

## 2023-10-31 MED ORDER — PANTOPRAZOLE SODIUM 40 MG IV SOLR
40.0000 mg | INTRAVENOUS | Status: DC
  Administered 2023-10-31 – 2023-11-01 (×2): 40 mg via INTRAVENOUS
  Filled 2023-10-31 (×2): qty 10

## 2023-10-31 MED ORDER — ONDANSETRON HCL 4 MG PO TABS: 4.0000 mg | ORAL_TABLET | Freq: Four times a day (QID) | ORAL | Status: DC | PRN

## 2023-10-31 MED ORDER — SODIUM CHLORIDE 0.9 % IV SOLN: INTRAVENOUS | Status: DC

## 2023-10-31 NOTE — ED Triage Notes (Signed)
 BIBEMS, coming from a group home. Pt was found by staff unresponsive in his bed, FD stated he wasn't breathing but was breathing with EMS. SB 44, 90/40, PMH: DM BGL: 80 99% on RA, GCS 14 baseline, GCS 9 now

## 2023-10-31 NOTE — Plan of Care (Signed)
 Pt upon arrival to unit is only responding to pain. Pt Has elevated BP and low HR, on telemetry. MD made aware. MD asked to come to unit to see pt. MD came to bedside. Pt is nonverbal. Pharmacy called to review/verify ordered medication.   Problem: Education: Goal: Ability to describe self-care measures that may prevent or decrease complications (Diabetes Survival Skills Education) will improve Outcome: Not Progressing Goal: Individualized Educational Video(s) Outcome: Not Progressing   Problem: Coping: Goal: Ability to adjust to condition or change in health will improve Outcome: Not Progressing   Problem: Fluid Volume: Goal: Ability to maintain a balanced intake and output will improve Outcome: Not Progressing   Problem: Health Behavior/Discharge Planning: Goal: Ability to identify and utilize available resources and services will improve Outcome: Not Progressing Goal: Ability to manage health-related needs will improve Outcome: Not Progressing   Problem: Metabolic: Goal: Ability to maintain appropriate glucose levels will improve Outcome: Not Progressing   Problem: Nutritional: Goal: Maintenance of adequate nutrition will improve Outcome: Not Progressing Goal: Progress toward achieving an optimal weight will improve Outcome: Not Progressing   Problem: Skin Integrity: Goal: Risk for impaired skin integrity will decrease Outcome: Not Progressing   Problem: Tissue Perfusion: Goal: Adequacy of tissue perfusion will improve Outcome: Not Progressing   Problem: Education: Goal: Knowledge of General Education information will improve Description: Including pain rating scale, medication(s)/side effects and non-pharmacologic comfort measures Outcome: Not Progressing   Problem: Health Behavior/Discharge Planning: Goal: Ability to manage health-related needs will improve Outcome: Not Progressing   Problem: Clinical Measurements: Goal: Ability to maintain clinical measurements  within normal limits will improve Outcome: Not Progressing Goal: Will remain free from infection Outcome: Not Progressing Goal: Diagnostic test results will improve Outcome: Not Progressing Goal: Respiratory complications will improve Outcome: Not Progressing Goal: Cardiovascular complication will be avoided Outcome: Not Progressing   Problem: Activity: Goal: Risk for activity intolerance will decrease Outcome: Not Progressing   Problem: Nutrition: Goal: Adequate nutrition will be maintained Outcome: Not Progressing   Problem: Coping: Goal: Level of anxiety will decrease Outcome: Not Progressing   Problem: Elimination: Goal: Will not experience complications related to bowel motility Outcome: Not Progressing Goal: Will not experience complications related to urinary retention Outcome: Not Progressing   Problem: Pain Managment: Goal: General experience of comfort will improve and/or be controlled Outcome: Not Progressing   Problem: Safety: Goal: Ability to remain free from injury will improve Outcome: Not Progressing   Problem: Skin Integrity: Goal: Risk for impaired skin integrity will decrease Outcome: Not Progressing   Problem: Education: Goal: Knowledge of General Education information will improve Description: Including pain rating scale, medication(s)/side effects and non-pharmacologic comfort measures Outcome: Not Progressing   Problem: Health Behavior/Discharge Planning: Goal: Ability to manage health-related needs will improve Outcome: Not Progressing   Problem: Clinical Measurements: Goal: Ability to maintain clinical measurements within normal limits will improve Outcome: Not Progressing Goal: Will remain free from infection Outcome: Not Progressing Goal: Diagnostic test results will improve Outcome: Not Progressing Goal: Respiratory complications will improve Outcome: Not Progressing Goal: Cardiovascular complication will be avoided Outcome: Not  Progressing   Problem: Activity: Goal: Risk for activity intolerance will decrease Outcome: Not Progressing   Problem: Nutrition: Goal: Adequate nutrition will be maintained Outcome: Not Progressing   Problem: Coping: Goal: Level of anxiety will decrease Outcome: Not Progressing   Problem: Elimination: Goal: Will not experience complications related to bowel motility Outcome: Not Progressing Goal: Will not experience complications related to urinary retention Outcome: Not  Progressing   Problem: Pain Managment: Goal: General experience of comfort will improve and/or be controlled Outcome: Not Progressing   Problem: Safety: Goal: Ability to remain free from injury will improve Outcome: Not Progressing   Problem: Skin Integrity: Goal: Risk for impaired skin integrity will decrease Outcome: Not Progressing

## 2023-10-31 NOTE — ED Notes (Signed)
 ED MD Siadecki checked images of ABD xray to confirm placement so we can administer lactulose for Pt. MD confirmed placement and gave verbal to give medication at this time

## 2023-10-31 NOTE — H&P (Addendum)
 History and Physical    [name] ZOX:[id number] DOB: 1951/03/03 DOA: 10/31/2023  PCP: [facility] (Confirm with patient/family/NH records and if not entered, this has to be entered at [facility] point of entry) Patient coming from: Group home  I have personally briefly reviewed patient's old medical records in [facility]  Chief Complaint: Altered mentation of  HPI: [name] is a 73 y.o. male with medical history significant of hepatitis C, liver cirrhosis, hepatic encephalopathy recurrent, TIA, chronic HFpEF, CAD, COPD, IIDM, HTN, HLD, sent from group home for evaluation of altered mentations.  Patient is confused and unable to provide any history, all history provided by ED physician and review of patient chart.  Patient was found to be confused this morning and sent to ED.  Unclear whether patient has been compliant with his lactulose.  ED workup found significant increase of ammonium level 2.43, blood work showed persistent pancytopenia WBC 2.4, hemoglobin 12.0 compared to baseline 10-11, platelet 83.  CT head reassuring.  NGT inserted and patient received lactulose x 1 in the ED.   Review of Systems: Unable to perform, patient is confused.  Past Medical History:  Diagnosis Date   (HFpEF) heart failure with preserved ejection fraction (HCC)    a.) TTE 03/20/2018: EF 60-65%, no RWMAs, mild LAE, mild-mod MR, PASP 42; b.) TTE 03/31/2021: EF 55-60%, no RWMAs, mild LVH, mild LAE, AoV sclerosis without stenosis; c.) TTE 03/25/2022: EF 60-65%, no RWMAs, AoV sclerosis without stenosis; d.) TTE 12/24/2022: EF 60-65%, no RWMAs, mild LVH, mild LAE.   Anasarca    Anemia    Anxiety    Aortic atherosclerosis (HCC)    Arthritis    Asthma    Bacteremia due to Escherichia coli 05/2023   Bacteremia due to Klebsiella pneumoniae 2023   Bilateral carotid artery disease (HCC)    Bradycardia    CAD (coronary artery disease) 10/21/2006   a.) MV 10/21/2006: small reversible  defect involving the apical  lateral wall c/w possible ischemia; b.) MV: 03/24/2018: no ischemia; c.) MV 10/04/2021: LAD calcifications   CAD (coronary artery disease)    Cervical radiculopathy    Chest pain    a.) MV 03/21/2018: no ischemia; b.) MV 10/04/2021: no ischemia   Chronic back pain    Chronic common bile duct dilatation    Cirrhosis (HCC)    a.) (+) hepatic lesion on CT; likely hepatocellular carcinoma; b.) CT evidence of associated portal hypertension; c.) (+) abdominopelvic anasarca   COPD (chronic obstructive pulmonary disease) (HCC)    DDD (degenerative disc disease), lumbar    Depression    Frequent falls    GERD (gastroesophageal reflux disease)    HBV (hepatitis B virus) infection    HCV (hepatitis C virus)    Hernia of anterior abdominal wall    History of 2019 novel coronavirus disease (COVID-19) 04/16/2020   Homelessness    a.) limited on placement as patient is listed on [place] sex offender listing for exploitation of a minor (possession of child pornography); incarcerated 12 years (released 11/2017)   Hyperlipidemia    Hypertension    Infestation by bed bug 02/2022   Malingering    Migraine    Nephrolithiasis    Obesity    Portal hypertension (HCC)    PTSD (post-traumatic stress disorder)    a.) brief training in the [employer] at age 70; someone was killed in front of him   Pulmonary HTN (HCC)    a.) multiple CT scans  desmontrating dilated PA c/w pHTN; b.) TTE 03/20/2018: PASP 42 mmHg   Sleep apnea    a.) no nocturnal PAP therapy   Sleep apnea    Stenosis of cervical spine with myelopathy (HCC)    Suicidal ideations    T2DM (type 2 diabetes mellitus) (HCC)    Thrombocytopenia (HCC)    a.) related to hepatic cirrhosis; spleen normal in size   TIA (transient ischemic attack)    TIA (transient ischemic attack) 09/30/2023    Past Surgical History:  Procedure Laterality Date   ANTERIOR CERVICAL DECOMP/DISCECTOMY FUSION N/A 07/01/2023   Procedure: ANTERIOR  CERVICAL DISCECTOMY AND FUSION (FORGE);  Surgeon: Venetia Night, MD;  Location: [facility] ORS;  Service: Neurosurgery;  Laterality: N/A;  Keep as last case of the day per Dr Myer Haff   ANTERIOR CERVICAL DISCECTOMY     APPENDECTOMY     CARDIAC CATHETERIZATION     CHOLECYSTECTOMY     EYE SURGERY     INNER EAR SURGERY     NOSE SURGERY     XI ROBOTIC LAPAROSCOPIC ASSISTED APPENDECTOMY N/A 11/05/2022   Procedure: XI ROBOTIC LAPAROSCOPIC ASSISTED APPENDECTOMY;  Surgeon: Carolan Shiver, MD;  Location: [facility] ORS;  Service: General;  Laterality: N/A;     reports that he has been smoking cigarettes. He has never used smokeless tobacco. He reports that he does not currently use alcohol. He reports that he does not currently use drugs.  Allergies  Allergen Reactions   Bee Venom Anaphylaxis   Codeine Itching    Only itching. Recently allergy tested at [facility]   Rsv Mrna Pre-F Virus Vaccine Swelling   Meloxicam     Other Reaction(s): ?overdose of local given at dentist--had trouble breathing   Codeine    Novocain [Procaine]    Sulfa Antibiotics Other (See Comments)   Sulfa Antibiotics     hallucinations    Family History  Problem Relation Age of Onset   Hypertension Mother    Heart disease Mother    Heart failure Maternal Grandmother     Prior to Admission medications   Medication Sig Start Date End Date Taking? Authorizing Provider  acetaminophen (TYLENOL) 650 MG CR tablet Take 650 mg by mouth every 8 (eight) hours as needed for pain.    [provider]  albuterol (PROVENTIL) (2.5 MG/3ML) 0.083% nebulizer solution Take 2.5 mg by nebulization every 4 (four) hours as needed for wheezing or shortness of breath. 07/16/23   [provider]  atorvastatin (LIPITOR) 40 MG tablet Take 40 mg by mouth daily.    [provider]  citalopram (CELEXA) 20 MG tablet Take 20 mg by mouth daily.    [provider]  feeding supplement (ENSURE ENLIVE / ENSURE PLUS) LIQD  Take 237 mLs by mouth 3 (three) times daily between meals. 10/25/23   Jonah Blue, MD  furosemide (LASIX) 20 MG tablet Take 1 tablet (20 mg total) by mouth daily. 06/21/23   Alford Highland, MD  gabapentin (NEURONTIN) 300 MG capsule Take 300 mg by mouth 2 (two) times daily. 06/07/23   [provider]  JARDIANCE 10 MG TABS tablet Take 10 mg by mouth daily. 10/22/23   [provider]  lactulose (CHRONULAC) 10 GM/15ML solution Take 45 mLs (30 g total) by mouth 3 (three) times daily. 08/05/23   Lanae Boast, MD  losartan (COZAAR) 50 MG tablet Take 50 mg by mouth daily.    [provider]  methocarbamol (ROBAXIN) 500 MG tablet Take 1 tablet (500 mg total)  by mouth every 6 (six) hours as needed for muscle spasms. Patient not taking: Reported on 10/30/2023 07/02/23   Susanne Borders, PA  Multiple Vitamin (MULTIVITAMIN WITH MINERALS) TABS tablet Take 1 tablet by mouth daily.    [provider]  Multiple Vitamins-Minerals (EYE MULTIVITAMIN PO) Take 1 tablet by mouth daily.    [provider]  nitroGLYCERIN (NITROLINGUAL) 0.4 MG/SPRAY spray Place 1 spray under the tongue every 5 (five) minutes x 3 doses as needed for chest pain. 10/30/23   Delma Freeze, FNP  ondansetron (ZOFRAN) 4 MG tablet Take 1 tablet (4 mg total) by mouth every 6 (six) hours as needed for nausea. 06/21/23   Alford Highland, MD  pantoprazole (PROTONIX) 40 MG tablet Take 40 mg by mouth daily. 09/24/23   [provider]  phenol (CHLORASEPTIC) 1.4 % LIQD Use as directed 1 spray in the mouth or throat as needed for throat irritation / pain.    [provider]  QUEtiapine (SEROQUEL) 25 MG tablet Take 25 mg by mouth at bedtime as needed (Agitation/sleep).    [provider]  senna (SENOKOT) 8.6 MG TABS tablet Take 1 tablet (8.6 mg total) by mouth 2 (two) times daily as needed for mild constipation. 07/02/23   Susanne Borders, PA  spironolactone (ALDACTONE) 25 MG tablet  Take 25 mg by mouth daily. 09/24/23   [provider]  VENTOLIN HFA 108 (90 Base) MCG/ACT inhaler Inhale 2 puffs into the lungs every 4 (four) hours as needed for wheezing. 07/16/23   [provider]  XIFAXAN 550 MG TABS tablet Take 550 mg by mouth 2 (two) times daily. 09/24/23   [provider]    Physical Exam: Vitals:   10/31/23 1145 10/31/23 1200 10/31/23 1215 10/31/23 1230  BP: (!) 173/72 (!) 171/70 (!) 144/79 (!) 164/66  Pulse: (!) 45 (!) 46 (!) 46 (!) 44  Resp: 16 14 (!) 5 13  Temp:      TempSrc:      SpO2: 100% 100% 100% 100%    Constitutional: NAD, calm, comfortable Vitals:   10/31/23 1145 10/31/23 1200 10/31/23 1215 10/31/23 1230  BP: (!) 173/72 (!) 171/70 (!) 144/79 (!) 164/66  Pulse: (!) 45 (!) 46 (!) 46 (!) 44  Resp: 16 14 (!) 5 13  Temp:      TempSrc:      SpO2: 100% 100% 100% 100%   Eyes: PERRL, lids and conjunctivae normal ENMT: Mucous membranes are moist. Posterior pharynx clear of any exudate or lesions.Normal dentition.  Neck: normal, supple, no masses, no thyromegaly Respiratory: clear to auscultation bilaterally, no wheezing, no crackles. Normal respiratory effort. No accessory muscle use.  Cardiovascular: Regular rate and rhythm, no murmurs / rubs / gallops. No extremity edema. 2+ pedal pulses. No carotid bruits.  Abdomen: no tenderness, no masses palpated. No hepatosplenomegaly. Bowel sounds positive.  Musculoskeletal: no clubbing / cyanosis. No joint deformity upper and lower extremities. Good ROM, no contractures. Normal muscle tone.  Skin: no rashes, lesions, ulcers. No induration Neurologic: Facial droops, moving limbs, not following command Psychiatric: Awake, obtunded, unable to answer any questions    Labs on Admission: I have personally reviewed following labs and imaging studies  CBC: Recent Labs  Lab 10/25/23 0526 10/31/23 0955  WBC 4.4 2.4*  NEUTROABS 2.8 1.0*  HGB 10.3* 12.0*  HCT 30.0* 36.7*  MCV 91.5 94.3   PLT 83* 83*   Basic Metabolic Panel: Recent Labs  Lab 10/24/23 1512 10/30/23 1220 10/31/23  0955  NA 139 143 143  K 3.5 3.6 4.1  CL 112* 112* 112*  CO2 20* 25 24  GLUCOSE 89 63* 93  BUN 14 21 18   CREATININE 0.95 1.30* 1.26*  CALCIUM 9.6 10.0 10.1  MG  --  1.8  --    GFR: Estimated Creatinine Clearance: 51.4 mL/min (A) (by C-G formula based on SCr of 1.26 mg/dL (H)). Liver Function Tests: Recent Labs  Lab 10/24/23 1512 10/31/23 0955  AST 50* 42*  ALT 29 31  ALKPHOS 55 65  BILITOT 2.3* 1.3*  PROT 6.5 6.6  ALBUMIN 2.7* 2.7*   No results for input(s): "LIPASE", "AMYLASE" in the last 168 hours. Recent Labs  Lab 10/25/23 0526 10/31/23 1010  AMMONIA 25 158*   Coagulation Profile: No results for input(s): "INR", "PROTIME" in the last 168 hours. Cardiac Enzymes: No results for input(s): "CKTOTAL", "CKMB", "CKMBINDEX", "TROPONINI" in the last 168 hours. BNP (last 3 results) No results for input(s): "PROBNP" in the last 8760 hours. HbA1C: No results for input(s): "HGBA1C" in the last 72 hours. CBG: Recent Labs  Lab 10/24/23 1610 10/24/23 1947 10/31/23 0956  GLUCAP 81 98 79   Lipid Profile: No results for input(s): "CHOL", "HDL", "LDLCALC", "TRIG", "CHOLHDL", "LDLDIRECT" in the last 72 hours. Thyroid Function Tests: No results for input(s): "TSH", "T4TOTAL", "FREET4", "T3FREE", "THYROIDAB" in the last 72 hours. Anemia Panel: No results for input(s): "VITAMINB12", "FOLATE", "FERRITIN", "TIBC", "IRON", "RETICCTPCT" in the last 72 hours. Urine analysis:    Component Value Date/Time   COLORURINE YELLOW (A) 10/31/2023 1010   APPEARANCEUR CLEAR (A) 10/31/2023 1010   LABSPEC 1.014 10/31/2023 1010   PHURINE 8.0 10/31/2023 1010   GLUCOSEU NEGATIVE 10/31/2023 1010   HGBUR NEGATIVE 10/31/2023 1010   BILIRUBINUR NEGATIVE 10/31/2023 1010   KETONESUR NEGATIVE 10/31/2023 1010   PROTEINUR NEGATIVE 10/31/2023 1010   NITRITE NEGATIVE 10/31/2023 1010   LEUKOCYTESUR  NEGATIVE 10/31/2023 1010    Radiological Exams on Admission: DG Abd Portable 1 View Result Date: 10/31/2023 CLINICAL DATA:  NG tube placement EXAM: PORTABLE ABDOMEN - 1 VIEW.  Limited for tube placement COMPARISON:  None Available. FINDINGS: Limited x-ray of the lower chest are upper abdomen demonstrates the enteric tube overlying the mediastinum and curved back upon itself, likely folded in the esophagus. Recommend this be removed and repositioned. In the upper abdomen note is made of moderate diffuse colonic stool. Gas is seen in nondilated loops of bowel. Surgical clips in the right upper quadrant. Overlapping cardiac leads. Critical Value/emergent results were called by telephone at the time of interpretation on 10/31/2023 at 2:13 pm to provider [facility] , who verbally acknowledged these results. IMPRESSION: Limited x-ray of the lower chest are upper abdomen demonstrates the enteric tube overlying the mediastinum and curved back upon itself, likely folded in the esophagus. Recommend this be removed and repositioned. Electronically Signed   By: Karen Kays M.D.   On: 10/31/2023 14:13   CT Head Wo Contrast Result Date: 10/31/2023 CLINICAL DATA:  Mental status change, unknown cause EXAM: CT HEAD WITHOUT CONTRAST TECHNIQUE: Contiguous axial images were obtained from the base of the skull through the vertex without intravenous contrast. RADIATION DOSE REDUCTION: This exam was performed according to the departmental dose-optimization program which includes automated exposure control, adjustment of the mA and/or kV according to patient size and/or use of iterative reconstruction technique. COMPARISON:  10/21/2023 FINDINGS: Brain: No acute intracranial abnormality. Specifically, no hemorrhage, hydrocephalus, mass lesion, acute infarction, or significant intracranial injury. Vascular: No  hyperdense vessel or unexpected calcification. Skull: No acute calvarial abnormality. Sinuses/Orbits: No acute findings  Other: None IMPRESSION: No acute intracranial abnormality. Electronically Signed   By: Charlett Nose M.D.   On: 10/31/2023 11:43   DG Chest Port 1 View Result Date: 10/31/2023 CLINICAL DATA:  Altered mental status EXAM: PORTABLE CHEST 1 VIEW COMPARISON:  09/30/2023 FINDINGS: The heart size and mediastinal contours are within normal limits. Both lungs are clear. The visualized skeletal structures are unremarkable. IMPRESSION: No active disease. Electronically Signed   By: Charlett Nose M.D.   On: 10/31/2023 11:43    EKG: Independently reviewed.  Sinus bradycardia, with P wave before each QRS on V1, computer reported as A-fib.  Assessment/Plan Principal Problem:   Hepatic encephalopathy (HCC) Active Problems:   Acute hepatic encephalopathy (HCC)  (please populate well all problems here in Problem List. (For example, if patient is on BP meds at home and you resume or decide to hold them, it is a problem that needs to be her. Same for CAD, COPD, HLD and so on)  Acute hepatic encephalopathy, recurrent, GCS=12 -Suspect noncoherent/noncompliant with lactulose -Due to severe mentation changes -High risk of readmission given the frequent decompensation of ammonium level recently. -Other Ddx, given there have been frequent decompensation of encephalopathy, will also check AFP.  Low suspicion for ascites and SBP on physical exam.  Pancytopenia -Secondary to cirrhosis -H&H stable, no signs of active bleeding, no signs of active infection.  Acute dysphagia -Secondary to acute encephalopathy -NGT inserted in ED and confirmed in place on x-ray  HTN -Continue Lasix Cozaar and spironolactone via NGT  Chronic HFpEF -Euvolemic, no acute concerns -Continue Lasix and spironolactone  COPD -No acute concerns  Major depression disorder -Continue Celexa hydroxyzine as needed and Seroquel as needed     DVT prophylaxis: SCD Code Status: DNR Family Communication: None at bedside Disposition Plan:  Expect less than 2 midnight hospital stay Consults called: None Admission status: Telemetry observation   Emeline General MD [facility] Pager [phone]  10/31/2023, 2:45 PM

## 2023-10-31 NOTE — ED Provider Notes (Signed)
[facility] Provider Note    Event Date/Time   First MD Initiated Contact with Patient 10/31/23 [phone]     (approximate)   History   Altered Mental Status   HPI  [name] is a 73 y.o. male with a history of hepatitis C, cirrhosis, hypertension, hyperlipidemia, CAD, sleep apnea, pulmonary hypertension, depression, and hepatic encephalopathy who presents with altered mental status, acute onset this morning.  The patient presented via EMS from his group home.  Per EMS, the staff noted that he was alert and at his baseline earlier today, but subsequently did not come down when called for breakfast and was found to be unresponsive.  The patient himself is unable to give any history.  I reviewed the past medical records.  The patient was admitted to the hospitalist service from 2/24 through 3/1 for hepatic encephalopathy.   Physical Exam   Triage Vital Signs: ED Triage Vitals [10/31/23 0950]  Encounter Vitals Group     BP (!) 168/79     Systolic BP Percentile      Diastolic BP Percentile      Pulse Rate (!) 41     Resp 18     Temp (!) 97.3 F (36.3 C)     Temp Source Oral     SpO2 100 %     Weight      Height      Head Circumference      Peak Flow      Pain Score      Pain Loc      Pain Education      Exclude from Growth Chart     Most recent vital signs: Vitals:   10/31/23 1200 10/31/23 1215  BP: (!) 171/70 (!) 144/79  Pulse: (!) 46 (!) 46  Resp: 14 (!) 5  Temp:    SpO2: 100% 100%     General: Somnolent, groans and moves purposefully to painful stimuli, no distress.  CV:  Good peripheral perfusion.  Resp:  Normal effort.  Lungs CTAB. Abd:  No distention.  Other:  EOMI.  PERRLA.  Motor intact in all extremities; localizes to painful stimuli.  No jaundice or scleral icterus.   ED Results / Procedures / Treatments   Labs (all labs ordered are listed, but only abnormal results are displayed) Labs Reviewed  COMPREHENSIVE METABOLIC  PANEL - Abnormal; Notable for the following components:      Result Value   Chloride 112 (*)    Creatinine, Ser 1.26 (*)    Albumin 2.7 (*)    AST 42 (*)    Total Bilirubin 1.3 (*)    GFR, Estimated 60 (*)    All other components within normal limits  CBC WITH DIFFERENTIAL/PLATELET - Abnormal; Notable for the following components:   WBC 2.4 (*)    RBC 3.89 (*)    Hemoglobin 12.0 (*)    HCT 36.7 (*)    RDW 16.2 (*)    Platelets 83 (*)    Neutro Abs 1.0 (*)    All other components within normal limits  SALICYLATE LEVEL - Abnormal; Notable for the following components:   Salicylate Lvl <7.0 (*)    All other components within normal limits  ACETAMINOPHEN LEVEL - Abnormal; Notable for the following components:   Acetaminophen (Tylenol), Serum <10 (*)    All other components within normal limits  URINALYSIS, ROUTINE W REFLEX MICROSCOPIC - Abnormal; Notable for the following components:   Color, Urine YELLOW (*)  APPearance CLEAR (*)    All other components within normal limits  URINE DRUG SCREEN, QUALITATIVE ([facility] ONLY) - Abnormal; Notable for the following components:   Tricyclic, Ur Screen POSITIVE (*)    All other components within normal limits  AMMONIA - Abnormal; Notable for the following components:   Ammonia 158 (*)    All other components within normal limits  TROPONIN I (HIGH SENSITIVITY) - Abnormal; Notable for the following components:   Troponin I (High Sensitivity) 19 (*)    All other components within normal limits  RESP PANEL BY RT-PCR (RSV, FLU A&B, COVID)  [id number]  CULTURE, BLOOD (ROUTINE X 2)  CULTURE, BLOOD (ROUTINE X 2)  LACTIC ACID, PLASMA  LACTIC ACID, PLASMA  CBG MONITORING, ED  TROPONIN I (HIGH SENSITIVITY)     EKG  ED ECG REPORT I, Dionne Bucy, the attending physician, personally viewed and interpreted this ECG.  Date: 10/31/2023 EKG Time: 0951 Rate: 42 Rhythm: Sinus bradycardia (interpreted by machine as both atrial fibrillation and  heart block, however P waves are visible prior to every QRS) QRS Axis: normal Intervals: normal ST/T Wave abnormalities: normal Narrative Interpretation: no evidence of acute ischemia    RADIOLOGY  Chest x-ray: I independently viewed and interpreted the images; there is no focal consolidation or edema  CT head: No acute abnormality   PROCEDURES:  Critical Care performed: Yes, see critical care procedure note(s)  Procedures   MEDICATIONS ORDERED IN ED: Medications  lactulose (CHRONULAC) 10 GM/15ML solution 30 g (has no administration in time range)  rifaximin (XIFAXAN) tablet 550 mg (has no administration in time range)     IMPRESSION / MDM / ASSESSMENT AND PLAN / ED COURSE  I reviewed the triage vital signs and the nursing notes.  73 year old male with PMH as noted above presents with acute onset of altered mental status and decreased responsiveness this morning.  On exam, he is hypertensive and somewhat bradycardic with otherwise normal vital signs.  He is arousable only to painful stimuli but does localize and move all extremities.  Pupils are reactive.  Differential diagnosis includes, but is not limited to, hepatic cephalopathy, UTI, pneumonia, viral syndrome, other acute infection/sepsis, metabolic encephalopathy due to other etiology, less likely primary CNS cause.  We will obtain CT head, chest x-ray, lab workup, and reassess.  At this time the patient is maintaining his airway, adequate respiratory status, and does not require intubation.  Per EMS the patient is DNR.  Patient's presentation is most consistent with acute presentation with potential threat to life or bodily function.  The patient is on the cardiac monitor to evaluate for evidence of arrhythmia and/or significant heart rate changes.  ----------------------------------------- 12:27 PM on 10/31/2023 -----------------------------------------  Lab workup is significant for ammonia of 158 consistent with  hepatic encephalopathy.  Other labs are overall unremarkable.  There is no significant leukocytosis.  There are no electrolyte abnormalities.  Due to the patient's mental status, he requires an NG tube to administer the lactulose and rifaximin.  Consulted Dr. Crosby Oyster from the hospitalist service; based on our discussion he agrees to evaluate the patient for admission.   FINAL CLINICAL IMPRESSION(S) / ED DIAGNOSES   Final diagnoses:  Hepatic encephalopathy (HCC)     Rx / DC Orders   ED Discharge Orders     None        Note:  This document was prepared using Dragon voice recognition software and may include unintentional dictation errors.    Dionne Bucy, MD 10/31/23  1228  

## 2023-11-01 ENCOUNTER — Other Ambulatory Visit: Payer: Self-pay

## 2023-11-01 DIAGNOSIS — I700 Atherosclerosis of aorta: Secondary | ICD-10-CM | POA: Diagnosis present

## 2023-11-01 DIAGNOSIS — I2510 Atherosclerotic heart disease of native coronary artery without angina pectoris: Secondary | ICD-10-CM | POA: Diagnosis present

## 2023-11-01 DIAGNOSIS — F329 Major depressive disorder, single episode, unspecified: Secondary | ICD-10-CM | POA: Diagnosis present

## 2023-11-01 DIAGNOSIS — M4802 Spinal stenosis, cervical region: Secondary | ICD-10-CM | POA: Diagnosis present

## 2023-11-01 DIAGNOSIS — Z8249 Family history of ischemic heart disease and other diseases of the circulatory system: Secondary | ICD-10-CM | POA: Diagnosis not present

## 2023-11-01 DIAGNOSIS — Z66 Do not resuscitate: Secondary | ICD-10-CM | POA: Diagnosis present

## 2023-11-01 DIAGNOSIS — E785 Hyperlipidemia, unspecified: Secondary | ICD-10-CM | POA: Diagnosis present

## 2023-11-01 DIAGNOSIS — R1310 Dysphagia, unspecified: Secondary | ICD-10-CM | POA: Diagnosis present

## 2023-11-01 DIAGNOSIS — Z8673 Personal history of transient ischemic attack (TIA), and cerebral infarction without residual deficits: Secondary | ICD-10-CM | POA: Diagnosis not present

## 2023-11-01 DIAGNOSIS — K7682 Hepatic encephalopathy: Secondary | ICD-10-CM

## 2023-11-01 DIAGNOSIS — N1831 Chronic kidney disease, stage 3a: Secondary | ICD-10-CM | POA: Diagnosis present

## 2023-11-01 DIAGNOSIS — I428 Other cardiomyopathies: Secondary | ICD-10-CM | POA: Diagnosis present

## 2023-11-01 DIAGNOSIS — I272 Pulmonary hypertension, unspecified: Secondary | ICD-10-CM | POA: Diagnosis present

## 2023-11-01 DIAGNOSIS — Z79899 Other long term (current) drug therapy: Secondary | ICD-10-CM | POA: Diagnosis not present

## 2023-11-01 DIAGNOSIS — Z7984 Long term (current) use of oral hypoglycemic drugs: Secondary | ICD-10-CM | POA: Diagnosis not present

## 2023-11-01 DIAGNOSIS — I5032 Chronic diastolic (congestive) heart failure: Secondary | ICD-10-CM

## 2023-11-01 DIAGNOSIS — I130 Hypertensive heart and chronic kidney disease with heart failure and stage 1 through stage 4 chronic kidney disease, or unspecified chronic kidney disease: Secondary | ICD-10-CM | POA: Diagnosis present

## 2023-11-01 DIAGNOSIS — Z8616 Personal history of COVID-19: Secondary | ICD-10-CM | POA: Diagnosis not present

## 2023-11-01 DIAGNOSIS — K766 Portal hypertension: Secondary | ICD-10-CM | POA: Diagnosis present

## 2023-11-01 DIAGNOSIS — R4182 Altered mental status, unspecified: Secondary | ICD-10-CM | POA: Diagnosis present

## 2023-11-01 DIAGNOSIS — E669 Obesity, unspecified: Secondary | ICD-10-CM | POA: Diagnosis present

## 2023-11-01 DIAGNOSIS — K7460 Unspecified cirrhosis of liver: Secondary | ICD-10-CM | POA: Diagnosis present

## 2023-11-01 DIAGNOSIS — J4489 Other specified chronic obstructive pulmonary disease: Secondary | ICD-10-CM | POA: Diagnosis present

## 2023-11-01 DIAGNOSIS — E1122 Type 2 diabetes mellitus with diabetic chronic kidney disease: Secondary | ICD-10-CM | POA: Diagnosis present

## 2023-11-01 DIAGNOSIS — D61818 Other pancytopenia: Secondary | ICD-10-CM | POA: Diagnosis present

## 2023-11-01 LAB — GLUCOSE, CAPILLARY
Glucose-Capillary: 94 mg/dL (ref 70–99)
Glucose-Capillary: 115 mg/dL — ABNORMAL HIGH (ref 70–99)
Glucose-Capillary: 118 mg/dL — ABNORMAL HIGH (ref 70–99)
Glucose-Capillary: 111 mg/dL — ABNORMAL HIGH (ref 70–99)

## 2023-11-01 LAB — AMMONIA: Ammonia: 55 umol/L — ABNORMAL HIGH (ref 9–35)

## 2023-11-01 MED ORDER — LACTULOSE 10 GM/15ML PO SOLN: 30.0000 g | Freq: Three times a day (TID) | ORAL | Status: DC

## 2023-11-01 MED ORDER — SPIRONOLACTONE 25 MG PO TABS: 25.0000 mg | ORAL_TABLET | Freq: Every day | ORAL | Status: DC

## 2023-11-01 MED ORDER — LACTULOSE 10 GM/15ML PO SOLN
30.0000 g | Freq: Three times a day (TID) | ORAL | Status: DC
Start: 2023-11-01 — End: 2023-11-01

## 2023-11-01 MED ORDER — FUROSEMIDE 40 MG PO TABS: 20.0000 mg | ORAL_TABLET | Freq: Every day | ORAL | Status: DC

## 2023-11-01 MED ORDER — ATORVASTATIN CALCIUM 20 MG PO TABS: 40.0000 mg | ORAL_TABLET | Freq: Every day | ORAL | Status: DC

## 2023-11-01 MED ORDER — GABAPENTIN 300 MG PO CAPS: 300.0000 mg | ORAL_CAPSULE | Freq: Two times a day (BID) | ORAL | Status: DC

## 2023-11-01 MED ORDER — RIFAXIMIN 550 MG PO TABS
550.0000 mg | ORAL_TABLET | Freq: Two times a day (BID) | ORAL | Status: DC
  Administered 2023-11-01 – 2023-11-07 (×12): 550 mg via ORAL
  Filled 2023-11-01 (×12): qty 1

## 2023-11-01 MED ORDER — ONDANSETRON HCL 4 MG/2ML IJ SOLN: 4.0000 mg | Freq: Four times a day (QID) | INTRAMUSCULAR | Status: DC | PRN

## 2023-11-01 MED ORDER — CITALOPRAM HYDROBROMIDE 20 MG PO TABS
20.0000 mg | ORAL_TABLET | Freq: Every day | ORAL | Status: DC
  Administered 2023-11-02 – 2023-11-07 (×6): 20 mg via ORAL
  Filled 2023-11-01 (×6): qty 1

## 2023-11-01 MED ORDER — ENSURE ENLIVE PO LIQD
237.0000 mL | Freq: Three times a day (TID) | ORAL | Status: DC
  Administered 2023-11-01 – 2023-11-07 (×11): 237 mL via ORAL

## 2023-11-01 MED ORDER — GABAPENTIN 300 MG PO CAPS
300.0000 mg | ORAL_CAPSULE | Freq: Two times a day (BID) | ORAL | Status: DC
  Administered 2023-11-01 – 2023-11-07 (×12): 300 mg via ORAL
  Filled 2023-11-01 (×12): qty 1

## 2023-11-01 MED ORDER — LOSARTAN POTASSIUM 50 MG PO TABS
50.0000 mg | ORAL_TABLET | Freq: Every day | ORAL | Status: DC
  Administered 2023-11-02 – 2023-11-07 (×6): 50 mg via ORAL
  Filled 2023-11-01 (×6): qty 1

## 2023-11-01 MED ORDER — ONDANSETRON HCL 4 MG PO TABS: 4.0000 mg | ORAL_TABLET | Freq: Four times a day (QID) | ORAL | Status: DC | PRN

## 2023-11-01 MED ORDER — RIFAXIMIN 550 MG PO TABS: 550.0000 mg | ORAL_TABLET | Freq: Two times a day (BID) | ORAL | Status: DC

## 2023-11-01 MED ORDER — LOSARTAN POTASSIUM 50 MG PO TABS: 50.0000 mg | ORAL_TABLET | Freq: Every day | ORAL | Status: DC

## 2023-11-01 MED ORDER — QUETIAPINE FUMARATE 25 MG PO TABS
25.0000 mg | ORAL_TABLET | Freq: Every evening | ORAL | Status: DC | PRN
  Administered 2023-11-01 – 2023-11-02 (×2): 25 mg via ORAL
  Filled 2023-11-01 (×2): qty 1

## 2023-11-01 MED ORDER — ENSURE ENLIVE PO LIQD
237.0000 mL | Freq: Three times a day (TID) | ORAL | Status: DC
Start: 2023-11-01 — End: 2023-11-01
  Administered 2023-11-01: 237 mL via ORAL

## 2023-11-01 MED ORDER — LACTULOSE 10 GM/15ML PO SOLN
30.0000 g | Freq: Three times a day (TID) | ORAL | Status: DC
  Administered 2023-11-02 – 2023-11-07 (×10): 30 g via ORAL
  Filled 2023-11-01 (×12): qty 60

## 2023-11-01 MED ORDER — SPIRONOLACTONE 25 MG PO TABS
25.0000 mg | ORAL_TABLET | Freq: Every day | ORAL | Status: DC
  Administered 2023-11-02 – 2023-11-07 (×6): 25 mg via ORAL
  Filled 2023-11-01 (×6): qty 1

## 2023-11-01 MED ORDER — FUROSEMIDE 20 MG PO TABS
20.0000 mg | ORAL_TABLET | Freq: Every day | ORAL | Status: DC
  Administered 2023-11-02 – 2023-11-07 (×6): 20 mg via ORAL
  Filled 2023-11-01 (×6): qty 1

## 2023-11-01 MED ORDER — ENSURE ENLIVE PO LIQD: 237.0000 mL | Freq: Three times a day (TID) | ORAL | Status: DC

## 2023-11-01 MED ORDER — QUETIAPINE FUMARATE 25 MG PO TABS: 25.0000 mg | ORAL_TABLET | Freq: Every evening | ORAL | Status: DC | PRN

## 2023-11-01 MED ORDER — ONDANSETRON HCL 4 MG PO TABS
4.0000 mg | ORAL_TABLET | Freq: Four times a day (QID) | ORAL | Status: DC | PRN
Start: 2023-11-01 — End: 2023-11-01

## 2023-11-01 MED ORDER — ATORVASTATIN CALCIUM 20 MG PO TABS
40.0000 mg | ORAL_TABLET | Freq: Every day | ORAL | Status: DC
  Administered 2023-11-02 – 2023-11-07 (×6): 40 mg via ORAL
  Filled 2023-11-01 (×6): qty 2

## 2023-11-01 MED ORDER — CITALOPRAM HYDROBROMIDE 20 MG PO TABS: 20.0000 mg | ORAL_TABLET | Freq: Every day | ORAL | Status: DC

## 2023-11-01 NOTE — Evaluation (Signed)
 Physical Therapy Evaluation Patient Details Name: [name] MRN: [id number] DOB: 03-17-1951 Today's Date: 11/01/2023  History of Present Illness  Pt is a 73 y.o. male presenting to hospital 10/31/23 with AMS and decreased responsiveness.  Pt admitted with acute hepatic encephalopathy, pancytopenia, acute dysphagia.  PMH includes asthma, CHF, COPD, DM, htn, sleep apnea, cardiac cath, eye sx, inner ear sx, nose sx, hepatitis C, liver cirrhosis, recurrent hepatic encephalopathy, TIA, PTSD, ACDF 2024.  Clinical Impression  Prior to recent medical concerns, per chart review and recent hospitalization, pt appears to have been ambulatory with RW; lives at a group home.  Pt c/o generalized body pain "all over" during session (nurse notified).  Pt able to state first name only and had difficulty following 1 step cues (pt appearing more confused than last hospitalization).  Currently pt is mod assist to sit on EOB; steady static sitting; limited activity d/t pt laying back down in bed and refusing to do any further activity with therapist.  Pt would currently benefit from skilled PT to address noted impairments and functional limitations (see below for any additional details).  Upon hospital discharge, pt would benefit from ongoing therapy.     If plan is discharge home, recommend the following: Two people to help with walking and/or transfers;A lot of help with bathing/dressing/bathroom;Assistance with cooking/housework;Assistance with feeding;Direct supervision/assist for medications management;Direct supervision/assist for financial management;Assist for transportation;Help with stairs or ramp for entrance;Supervision due to cognitive status   Can travel by private vehicle   No    Equipment Recommendations Other (comment) (TBD at next facility)  Recommendations for Other Services  OT consult    Functional Status Assessment Patient has had a recent decline in their functional status and demonstrates  the ability to make significant improvements in function in a reasonable and predictable amount of time.     Precautions / Restrictions Precautions Precautions: Fall Recall of Precautions/Restrictions: Impaired Restrictions Weight Bearing Restrictions Per Provider Order: No      Mobility  Bed Mobility Overal bed mobility: Needs Assistance Bed Mobility: Supine to Sit, Sit to Supine     Supine to sit: Mod assist, HOB elevated (assist for trunk) Sit to supine: Supervision, HOB elevated   General bed mobility comments: assist for safety; 2 assist to boost pt up in bed end of session using bed pad    Transfers                   General transfer comment: unable to attempt d/t pt laying back down in bed and declining further activity    Ambulation/Gait                  Stairs            Wheelchair Mobility     Tilt Bed    Modified Rankin (Stroke Patients Only)       Balance Overall balance assessment: Needs assistance Sitting-balance support: Bilateral upper extremity supported, Feet supported Sitting balance-Leahy Scale: Fair Sitting balance - Comments: steady static sitting balance                                     Pertinent Vitals/Pain Pain Assessment Pain Assessment: Faces Faces Pain Scale: Hurts little more Pain Location: generalized body pain "all over" Pain Descriptors / Indicators: Discomfort Pain Intervention(s): Limited activity within patient's tolerance, Monitored during session, Repositioned (RN notified) HR around 57 bpm during  session.    Home Living Family/patient expects to be discharged to:: Group home Living Arrangements: Group Home   Type of Home: Group Home Home Access: Ramped entrance       Home Layout: One level Home Equipment: Agricultural consultant (2 wheels);Educational psychologist (4 wheels) Additional Comments: Above information per recent hospitalization (pt not able to provide any details)     Prior Function Prior Level of Function : Needs assist;History of Falls (last six months)             Mobility Comments: Per recent hospitalization notes, pt was modified independent ambulating limtied community distances with RW; h/o falls ADLs Comments: Per recent hospitalization notes, group home staff assists with ADL's     Extremity/Trunk Assessment   Upper Extremity Assessment Upper Extremity Assessment: Generalized weakness    Lower Extremity Assessment Lower Extremity Assessment: Generalized weakness       Communication   Communication Communication: Impaired Factors Affecting Communication: Difficulty expressing self (pt did not answer questions consistently)    Cognition Arousal: Alert Behavior During Therapy: WFL for tasks assessed/performed   PT - Cognitive impairments: Difficult to assess Difficult to assess due to:  (D/t pt's level of confusion and not answering questions)                     PT - Cognition Comments: Pt oriented to at least first name (pt did not answer rest of orientation questions) Following commands: Impaired Following commands impaired: Follows one step commands inconsistently     Cueing Cueing Techniques: Verbal cues, Gestural cues, Tactile cues, Visual cues     General Comments General comments (skin integrity, edema, etc.): B mitts in place    Exercises     Assessment/Plan    PT Assessment Patient needs continued PT services  PT Problem List Decreased strength;Decreased activity tolerance;Decreased balance;Decreased mobility;Decreased cognition;Decreased safety awareness;Decreased knowledge of precautions;Pain       PT Treatment Interventions DME instruction;Gait training;Functional mobility training;Therapeutic activities;Therapeutic exercise;Balance training;Patient/family education;Cognitive remediation    PT Goals (Current goals can be found in the Care Plan section)  Acute Rehab PT Goals Patient Stated  Goal: pt did not state any goals PT Goal Formulation: Patient unable to participate in goal setting Time For Goal Achievement: 11/15/23 Potential to Achieve Goals: Fair    Frequency Min 2X/week     Co-evaluation               AM-PAC PT "6 Clicks" Mobility  Outcome Measure Help needed turning from your back to your side while in a flat bed without using bedrails?: None Help needed moving from lying on your back to sitting on the side of a flat bed without using bedrails?: A Lot Help needed moving to and from a bed to a chair (including a wheelchair)?: A Lot Help needed standing up from a chair using your arms (e.g., wheelchair or bedside chair)?: A Lot Help needed to walk in hospital room?: Total Help needed climbing 3-5 steps with a railing? : Total 6 Click Score: 12    End of Session   Activity Tolerance: Patient tolerated treatment well Patient left: in bed;with call bell/phone within reach;with bed alarm set Nurse Communication: Mobility status;Precautions;Other (comment) (Pt's c/o generalized body pain "all over") PT Visit Diagnosis: Other abnormalities of gait and mobility (R26.89);Muscle weakness (generalized) (M62.81);History of falling (Z91.81);Pain Pain - part of body:  ("all over")    Time: 1610-9604 PT Time Calculation (min) (ACUTE ONLY): 14 min   Charges:  PT Evaluation $PT Eval Low Complexity: 1 Low   PT General Charges $$ ACUTE PT VISIT: 1 Visit        Hendricks Limes, PT 11/01/23, 9:02 AM

## 2023-11-01 NOTE — Evaluation (Signed)
 Occupational Therapy Evaluation Patient Details Name: [name] MRN: [id number] DOB: 1951/08/26 Today's Date: 11/01/2023   History of Present Illness   Pt is a 73 y.o. male presenting to hospital 10/31/23 with AMS and decreased responsiveness.  Pt admitted with acute hepatic encephalopathy, pancytopenia, acute dysphagia.  PMH includes asthma, CHF, COPD, DM, htn, sleep apnea, cardiac cath, eye sx, inner ear sx, nose sx, hepatitis C, liver cirrhosis, recurrent hepatic encephalopathy, TIA, PTSD, ACDF 2024.     Clinical Impressions Pt in bed upon OT arrival.  Mood lability throughout session, initially cooperative and agreeable to sit up.  3 trials of EOB sitting completed, with pt returning himself to supine.  Purewick had been out upon OT arrival, and noted linens to be wet.  2 NT's in to assist OT with linen change.  OT performed peri care with total assist.  Pt became agitated with rolling and linen change, limiting effective participation in bed mobility.  Further assessment of OOB activity needed, but pt was limited today by confusion and ultimately agitation.  Pt will continue to benefit from skilled OT in the acute setting to maximize safety and participation in ADLs.       If plan is discharge home, recommend the following:   Assistance with feeding;Assist for transportation;Direct supervision/assist for medications management;Help with stairs or ramp for entrance;Supervision due to cognitive status;Direct supervision/assist for financial management;Assistance with cooking/housework;A lot of help with bathing/dressing/bathroom     Functional Status Assessment   Patient has had a recent decline in their functional status and demonstrates the ability to make significant improvements in function in a reasonable and predictable amount of time.     Equipment Recommendations   Other (comment) (defer to next venue of care)     Recommendations for Other Services          Precautions/Restrictions   Precautions Precautions: Fall Recall of Precautions/Restrictions: Impaired Restrictions Weight Bearing Restrictions Per Provider Order: No     Mobility Bed Mobility Overal bed mobility: Needs Assistance Bed Mobility: Supine to Sit, Sit to Supine, Rolling Rolling: Max assist, +2 for safety/equipment   Supine to sit: Mod assist, HOB elevated Sit to supine: Supervision, HOB elevated   General bed mobility comments: assist for safety; 2 person assist for linen changes d/t Purewick leaked and linens wet.  2 person assist for rolling and boosting pt back toward [facility] d/t pt became agitated with rolling and positional changes. Patient Response: Restless  Transfers Overall transfer level: Needs assistance                 General transfer comment: Pt not responding to commands for prep for a STS transfer after returning himself to supine after 3 trials of EOB sitting.      Balance Overall balance assessment: Needs assistance Sitting-balance support: Bilateral upper extremity supported, Feet supported Sitting balance-Leahy Scale: Fair Sitting balance - Comments: difficult to assess as pt was restless and focused on returning himself to supine       Standing balance comment: NT this date as pt repeatedly returned himself to supine from EOB sitting.                           ADL either performed or assessed with clinical judgement   ADL Overall ADL's : Needs assistance/impaired  General ADL Comments: Currently max-total assist for all ADLs at this time d/t pt confused and inconsistently following commands     Vision                           Pertinent Vitals/Pain Pain Assessment Pain Assessment: Faces Pain Score: 4  Pain Location: generalized body pain "all over" Pain Descriptors / Indicators: Discomfort Pain Intervention(s): Limited activity within patient's  tolerance, Monitored during session, Repositioned     Extremity/Trunk Assessment Upper Extremity Assessment Upper Extremity Assessment: Generalized weakness   Lower Extremity Assessment Lower Extremity Assessment: Generalized weakness       Communication Communication Communication: Impaired Factors Affecting Communication: Difficulty expressing self   Cognition Arousal: Alert Behavior During Therapy: Lability Cognition: Cognition impaired, No family/caregiver present to determine baseline   Orientation impairments: Time, Situation, Place         OT - Cognition Comments: Oriented to name only                 Following commands: Impaired Following commands impaired: Follows one step commands inconsistently     Cueing  General Comments   Cueing Techniques: Verbal cues;Gestural cues;Tactile cues;Visual cues  B mitts in place   Exercises Other Exercises Other Exercises: Educ on OT role   Shoulder Instructions      Home Living Family/patient expects to be discharged to:: Group home Living Arrangements: Group Home Available Help at Discharge: Personal care attendant Type of Home: Group Home Home Access: Ramped entrance     Home Layout: One level     Bathroom Shower/Tub: Producer, television/film/video: Handicapped height     Home Equipment: Agricultural consultant (2 wheels);Educational psychologist (4 wheels)   Additional Comments: Per PT note, Above information per recent hospitalization (pt not able to provide any details)      Prior Functioning/Environment Prior Level of Function : Needs assist;History of Falls (last six months)             Mobility Comments: Per recent hospitalization notes, pt was modified independent ambulating limtied community distances with RW; h/o falls ADLs Comments: Per recent hospitalization notes, group home staff assists with ADL's    OT Problem List: Decreased coordination;Decreased cognition;Decreased safety  awareness;Decreased knowledge of use of DME or AE;Decreased strength;Decreased activity tolerance;Impaired balance (sitting and/or standing);Decreased knowledge of precautions;Pain   OT Treatment/Interventions: Self-care/ADL training;Therapeutic activities;DME and/or AE instruction;Cognitive remediation/compensation;Energy conservation;Balance training;Therapeutic exercise;Neuromuscular education;Patient/family education      OT Goals(Current goals can be found in the care plan section)   Acute Rehab OT Goals Patient Stated Goal: Return to PLOF OT Goal Formulation: Patient unable to participate in goal setting Time For Goal Achievement: 11/08/23 Potential to Achieve Goals: Fair ADL Goals Pt Will Perform Grooming: with mod assist;standing Pt Will Transfer to Toilet: with min assist;bedside commode Pt Will Perform Toileting - Clothing Manipulation and hygiene: with mod assist;sit to/from stand   OT Frequency:  Min 1X/week    Co-evaluation              AM-PAC OT "6 Clicks" Daily Activity     Outcome Measure Help from another person eating meals?: A Lot Help from another person taking care of personal grooming?: Total Help from another person toileting, which includes using toliet, bedpan, or urinal?: Total Help from another person bathing (including washing, rinsing, drying)?: Total Help from another person to put on and taking off regular upper body clothing?: Total Help from another  person to put on and taking off regular lower body clothing?: Total 6 Click Score: 7   End of Session Nurse Communication: Other (comment)  Activity Tolerance: Patient limited by pain;Other (comment) (confusion) Patient left: in bed;with call bell/phone within reach;with bed alarm set;with nursing/sitter in room;Other (comment) (2 NT in room finishing with placing a new Purewick and pt's had pulled off)  OT Visit Diagnosis: Unsteadiness on feet (R26.81);Other abnormalities of gait and mobility  (R26.89);Muscle weakness (generalized) (M62.81);Other symptoms and signs involving cognitive function                Time: 0952-1010 OT Time Calculation (min): 18 min Charges:  OT General Charges $OT Visit: 1 Visit OT Evaluation $OT Eval Moderate Complexity: 1 Mod Danelle Earthly, MS, OTR/L [place] Dials 11/01/2023, 12:30 PM

## 2023-11-01 NOTE — Plan of Care (Signed)
  Problem: Education: Goal: Ability to describe self-care measures that may prevent or decrease complications (Diabetes Survival Skills Education) will improve Outcome: Not Progressing   Problem: Coping: Goal: Ability to adjust to condition or change in health will improve Outcome: Not Progressing   Problem: Fluid Volume: Goal: Ability to maintain a balanced intake and output will improve Outcome: Progressing   Problem: Health Behavior/Discharge Planning: Goal: Ability to identify and utilize available resources and services will improve Outcome: Not Progressing Goal: Ability to manage health-related needs will improve Outcome: Not Progressing   Problem: Metabolic: Goal: Ability to maintain appropriate glucose levels will improve Outcome: Progressing   Problem: Nutritional: Goal: Maintenance of adequate nutrition will improve Outcome: Progressing Goal: Progress toward achieving an optimal weight will improve Outcome: Progressing   Problem: Skin Integrity: Goal: Risk for impaired skin integrity will decrease Outcome: Progressing   Problem: Education: Goal: Knowledge of General Education information will improve Description: Including pain rating scale, medication(s)/side effects and non-pharmacologic comfort measures Outcome: Not Progressing   Problem: Health Behavior/Discharge Planning: Goal: Ability to manage health-related needs will improve Outcome: Progressing   Problem: Clinical Measurements: Goal: Ability to maintain clinical measurements within normal limits will improve Outcome: Progressing Goal: Will remain free from infection Outcome: Progressing Goal: Diagnostic test results will improve Outcome: Progressing Goal: Respiratory complications will improve Outcome: Progressing Goal: Cardiovascular complication will be avoided Outcome: Progressing   Problem: Activity: Goal: Risk for activity intolerance will decrease Outcome: Not Progressing   Problem:  Nutrition: Goal: Adequate nutrition will be maintained Outcome: Progressing   Problem: Elimination: Goal: Will not experience complications related to bowel motility Outcome: Progressing Goal: Will not experience complications related to urinary retention Outcome: Progressing   Problem: Pain Managment: Goal: General experience of comfort will improve and/or be controlled Outcome: Progressing   Problem: Skin Integrity: Goal: Risk for impaired skin integrity will decrease Outcome: Progressing   Problem: Education: Goal: Knowledge of General Education information will improve Description: Including pain rating scale, medication(s)/side effects and non-pharmacologic comfort measures Outcome: Progressing   Problem: Health Behavior/Discharge Planning: Goal: Ability to manage health-related needs will improve Outcome: Not Progressing   Problem: Clinical Measurements: Goal: Ability to maintain clinical measurements within normal limits will improve Outcome: Progressing Goal: Will remain free from infection Outcome: Progressing Goal: Diagnostic test results will improve Outcome: Progressing Goal: Respiratory complications will improve Outcome: Progressing Goal: Cardiovascular complication will be avoided Outcome: Progressing   Problem: Activity: Goal: Risk for activity intolerance will decrease Outcome: Progressing   Problem: Coping: Goal: Level of anxiety will decrease Outcome: Progressing

## 2023-11-01 NOTE — Progress Notes (Signed)
 PROGRESS NOTE    KRISTJAN DERNER  UUV:[id number] DOB: 07/09/1951 DOA: 10/31/2023 PCP: [facility]    Brief Narrative:   73 y.o. male with medical history significant of hepatitis C, liver cirrhosis, hepatic encephalopathy recurrent, TIA, chronic HFpEF, CAD, COPD, IIDM, HTN, HLD, sent from group home for evaluation of altered mentations.   Patient is confused and unable to provide any history, all history provided by ED physician and review of patient chart.  Patient was found to be confused this morning and sent to ED.  Unclear whether patient has been compliant with his lactulose.  ED workup found significant increase of ammonium level 2.43, blood work showed persistent pancytopenia WBC 2.4, hemoglobin 12.0 compared to baseline 10-11, platelet 83.  CT head reassuring.  NGT inserted on admission.  Patient mental status has improved slightly to the point that he can eat and swallow by mouth with limited risk of aspiration.  NGT will be discontinued 3/7.   Assessment & Plan:   Principal Problem:   Hepatic encephalopathy (HCC) Active Problems:   Acute hepatic encephalopathy (HCC)   Acute hepatic encephalopathy, recurrent -Suspect noncoherent/noncompliant with lactulose -Due to severe mentation changes -Having bowel movements after several doses of lactulose now administered Plan: DC NGT Continue lactulose Titrate dose to 3-4 soft bowel movements daily Avoid diarrhea  Pancytopenia -Secondary to cirrhosis -H&H stable, no signs of active bleeding, no signs of active infection.   Acute dysphagia -Secondary to acute encephalopathy -Improved.  Now on dysphagia 1 diet   HTN -Lasix, Cozaar, Aldactone   Chronic HFpEF -Euvolemic, no acute concerns -Continue Lasix and spironolactone   COPD -No acute concerns   Major depression disorder -Continue Celexa hydroxyzine as needed and Seroquel as needed    DVT prophylaxis: SCD Code Status: DNR Family  Communication: None Disposition Plan: Status is: Inpatient Remains inpatient appropriate because: Hepatic encephalopathy   Level of care: Med-Surg  Consultants:  None  Procedures:  None  Antimicrobials: None   Subjective: Seen and examined.  Unable to provide history.  Lethargic.  Objective: Vitals:   11/01/23 0444 11/01/23 0447 11/01/23 0724 11/01/23 0918  BP: (!) 163/96 (!) 153/66 (!) 148/57 (!) 163/71  Pulse: (!) 55 (!) 54 (!) 55 (!) 55  Resp: 18  18   Temp: 98 F (36.7 C)  98.9 F (37.2 C)   TempSrc:      SpO2: 100%  99%    No intake or output data in the 24 hours ending 11/01/23 1351 There were no vitals filed for this visit.  Examination:  General exam: Appears frail and chronically ill.  Appears disheveled Respiratory system: Poor respiratory effort.  Scattered crackles.  Normal work of breathing.  Room air Cardiovascular system: S1-S2, RRR, no murmurs, no pedal edema Gastrointestinal system: Mild distention, nontender Central nervous system: Alert.  Oriented x 0.  Unable to assess further Extremities: Symmetric 5 x 5 power. Skin: No rashes, lesions or ulcers Psychiatry: Unable to assess    Data Reviewed: I have personally reviewed following labs and imaging studies  CBC: Recent Labs  Lab 10/31/23 0955  WBC 2.4*  NEUTROABS 1.0*  HGB 12.0*  HCT 36.7*  MCV 94.3  PLT 83*   Basic Metabolic Panel: Recent Labs  Lab 10/30/23 1220 10/31/23 0955  NA 143 143  K 3.6 4.1  CL 112* 112*  CO2 25 24  GLUCOSE 63* 93  BUN 21 18  CREATININE 1.30* 1.26*  CALCIUM 10.0 10.1  MG 1.8  --  GFR: Estimated Creatinine Clearance: 51.4 mL/min (A) (by C-G formula based on SCr of 1.26 mg/dL (H)). Liver Function Tests: Recent Labs  Lab 10/31/23 0955  AST 42*  ALT 31  ALKPHOS 65  BILITOT 1.3*  PROT 6.6  ALBUMIN 2.7*   No results for input(s): "LIPASE", "AMYLASE" in the last 168 hours. Recent Labs  Lab 10/31/23 1010 11/01/23 0523  AMMONIA 158* 55*    Coagulation Profile: No results for input(s): "INR", "PROTIME" in the last 168 hours. Cardiac Enzymes: No results for input(s): "CKTOTAL", "CKMB", "CKMBINDEX", "TROPONINI" in the last 168 hours. BNP (last 3 results) No results for input(s): "PROBNP" in the last 8760 hours. HbA1C: No results for input(s): "HGBA1C" in the last 72 hours. CBG: Recent Labs  Lab 10/31/23 1658 10/31/23 2025 10/31/23 2332 11/01/23 0725 11/01/23 1153  GLUCAP 87 101* 135* 94 115*   Lipid Profile: No results for input(s): "CHOL", "HDL", "LDLCALC", "TRIG", "CHOLHDL", "LDLDIRECT" in the last 72 hours. Thyroid Function Tests: No results for input(s): "TSH", "T4TOTAL", "FREET4", "T3FREE", "THYROIDAB" in the last 72 hours. Anemia Panel: No results for input(s): "VITAMINB12", "FOLATE", "FERRITIN", "TIBC", "IRON", "RETICCTPCT" in the last 72 hours. Sepsis Labs: Recent Labs  Lab 10/31/23 0954 10/31/23 1501  LATICACIDVEN 1.7 1.9    Recent Results (from the past 240 hours)  Culture, blood (routine x 2)     Status: None (Preliminary result)   Collection Time: 10/31/23  9:54 AM   Specimen: BLOOD  Result Value Ref Range Status   Specimen Description BLOOD BLOOD LEFT ARM  Final   Special Requests   Final    BOTTLES DRAWN AEROBIC ONLY Blood Culture results may not be optimal due to an inadequate volume of blood received in culture bottles   Culture   Final    NO GROWTH < 24 HOURS Performed at [facility], [address] [place], [place] [address]    Report Status PENDING  Incomplete  Culture, blood (routine x 2)     Status: None (Preliminary result)   Collection Time: 10/31/23  9:54 AM   Specimen: BLOOD  Result Value Ref Range Status   Specimen Description BLOOD BLOOD RIGHT ARM  Final   Special Requests   Final    BOTTLES DRAWN AEROBIC AND ANAEROBIC Blood Culture results may not be optimal due to an inadequate volume of blood received in culture bottles   Culture   Final    NO GROWTH < 24  HOURS Performed at [facility], [address], [place], [place] [address]    Report Status PENDING  Incomplete  Resp panel by RT-PCR (RSV, Flu A&B, Covid) Anterior Nasal Swab     Status: None   Collection Time: 10/31/23 10:10 AM   Specimen: Anterior Nasal Swab  Result Value Ref Range Status   SARS Coronavirus 2 by RT PCR NEGATIVE NEGATIVE Final    Comment: (NOTE) SARS-CoV-2 target nucleic acids are NOT DETECTED.  The SARS-CoV-2 RNA is generally detectable in upper respiratory specimens during the acute phase of infection. The lowest concentration of SARS-CoV-2 viral copies this assay can detect is 138 copies/mL. A negative result does not preclude SARS-Cov-2 infection and should not be used as the sole basis for treatment or other patient management decisions. A negative result may occur with  improper specimen collection/handling, submission of specimen other than nasopharyngeal swab, presence of viral mutation(s) within the areas targeted by this assay, and inadequate number of viral copies(<138 copies/mL). A negative result must be combined with clinical  observations, patient history, and epidemiological information. The expected result is Negative.  Fact Sheet for Patients:  BloggerCourse.com  Fact Sheet for Healthcare Providers:  SeriousBroker.it  This test is no t yet approved or cleared by the [place] FDA and  has been authorized for detection and/or diagnosis of SARS-CoV-2 by FDA under an Emergency Use Authorization (EUA). This EUA will remain  in effect (meaning this test can be used) for the duration of the COVID-19 declaration under Section 564(b)(1) of the Act, 21 U.S.C.section 360bbb-3(b)(1), unless the authorization is terminated  or revoked sooner.       Influenza A by PCR NEGATIVE NEGATIVE Final   Influenza B by PCR NEGATIVE NEGATIVE Final    Comment: (NOTE) The Xpert Xpress  SARS-CoV-2/FLU/RSV plus assay is intended as an aid in the diagnosis of influenza from Nasopharyngeal swab specimens and should not be used as a sole basis for treatment. Nasal washings and aspirates are unacceptable for Xpert Xpress SARS-CoV-2/FLU/RSV testing.  Fact Sheet for Patients: BloggerCourse.com  Fact Sheet for Healthcare Providers: SeriousBroker.it  This test is not yet approved or cleared by the [place] FDA and has been authorized for detection and/or diagnosis of SARS-CoV-2 by FDA under an Emergency Use Authorization (EUA). This EUA will remain in effect (meaning this test can be used) for the duration of the COVID-19 declaration under Section 564(b)(1) of the Act, 21 U.S.C. section 360bbb-3(b)(1), unless the authorization is terminated or revoked.     Resp Syncytial Virus by PCR NEGATIVE NEGATIVE Final    Comment: (NOTE) Fact Sheet for Patients: BloggerCourse.com  Fact Sheet for Healthcare Providers: SeriousBroker.it  This test is not yet approved or cleared by the [place] FDA and has been authorized for detection and/or diagnosis of SARS-CoV-2 by FDA under an Emergency Use Authorization (EUA). This EUA will remain in effect (meaning this test can be used) for the duration of the COVID-19 declaration under Section 564(b)(1) of the Act, 21 U.S.C. section 360bbb-3(b)(1), unless the authorization is terminated or revoked.  Performed at [facility], [address], [place], [place] [address]          Radiology Studies: DG Abd Portable 1 View Result Date: 10/31/2023 CLINICAL DATA:  NG tube position EXAM: PORTABLE ABDOMEN - 1 VIEW limited for tube placement COMPARISON:  X-ray earlier 10/31/2023. FINDINGS: Limited x-ray for tube placement of the upper abdomen has tip overlying the midbody of the stomach but side hole at the GE junction. This  could be advanced further into the stomach. Elsewhere surgical clips in the right upper quadrant. Gas seen along nondilated loops of visualized small and large bowel. Scattered colonic stool. Overlapping cardiac leads. IMPRESSION: Limited x-ray for tube placement now has tip of the catheter overlying the mid stomach but the side hole at the GE junction. This could be advanced further into the stomach. Electronically Signed   By: Karen Kays M.D.   On: 10/31/2023 16:59   DG Abd Portable 1 View Result Date: 10/31/2023 CLINICAL DATA:  NG tube placement EXAM: PORTABLE ABDOMEN - 1 VIEW.  Limited for tube placement COMPARISON:  None Available. FINDINGS: Limited x-ray of the lower chest are upper abdomen demonstrates the enteric tube overlying the mediastinum and curved back upon itself, likely folded in the esophagus. Recommend this be removed and repositioned. In the upper abdomen note is made of moderate diffuse colonic stool. Gas is seen in nondilated loops of bowel. Surgical clips in the right upper quadrant. Overlapping cardiac leads. Critical Value/emergent  results were called by telephone at the time of interpretation on 10/31/2023 at 2:13 pm to provider [facility] , who verbally acknowledged these results. IMPRESSION: Limited x-ray of the lower chest are upper abdomen demonstrates the enteric tube overlying the mediastinum and curved back upon itself, likely folded in the esophagus. Recommend this be removed and repositioned. Electronically Signed   By: Karen Kays M.D.   On: 10/31/2023 14:13   CT Head Wo Contrast Result Date: 10/31/2023 CLINICAL DATA:  Mental status change, unknown cause EXAM: CT HEAD WITHOUT CONTRAST TECHNIQUE: Contiguous axial images were obtained from the base of the skull through the vertex without intravenous contrast. RADIATION DOSE REDUCTION: This exam was performed according to the departmental dose-optimization program which includes automated exposure control, adjustment of  the mA and/or kV according to patient size and/or use of iterative reconstruction technique. COMPARISON:  10/21/2023 FINDINGS: Brain: No acute intracranial abnormality. Specifically, no hemorrhage, hydrocephalus, mass lesion, acute infarction, or significant intracranial injury. Vascular: No hyperdense vessel or unexpected calcification. Skull: No acute calvarial abnormality. Sinuses/Orbits: No acute findings Other: None IMPRESSION: No acute intracranial abnormality. Electronically Signed   By: Charlett Nose M.D.   On: 10/31/2023 11:43   DG Chest Port 1 View Result Date: 10/31/2023 CLINICAL DATA:  Altered mental status EXAM: PORTABLE CHEST 1 VIEW COMPARISON:  09/30/2023 FINDINGS: The heart size and mediastinal contours are within normal limits. Both lungs are clear. The visualized skeletal structures are unremarkable. IMPRESSION: No active disease. Electronically Signed   By: Charlett Nose M.D.   On: 10/31/2023 11:43        Scheduled Meds:  atorvastatin  40 mg Per NG tube Daily   citalopram  20 mg Per NG tube Daily   feeding supplement  237 mL Per NG tube TID BM   furosemide  20 mg Per Tube Daily   gabapentin  300 mg Per Tube BID   insulin aspart  0-9 Units Subcutaneous TID WC   lactulose  30 g Per Tube TID   losartan  50 mg Per NG tube Daily   pantoprazole (PROTONIX) IV  40 mg Intravenous Q24H   rifaximin  550 mg Per Tube Once   rifaximin  550 mg Per Tube BID   spironolactone  25 mg Per NG tube Daily   Continuous Infusions:   LOS: 0 days    Tresa Moore, MD [facility]   If 7PM-7AM, please contact night-coverage  11/01/2023, 1:51 PM

## 2023-11-01 NOTE — Progress Notes (Signed)
 Patient spitting out medication. Refusing lactulose

## 2023-11-01 NOTE — TOC Progression Note (Signed)
 Transition of Care [facility]) - Progression Note    Patient Details  Name: [name] MRN: [id number] Date of Birth: 05-18-1951  [facility] CM/SW Contact  Erin Sons, [place] Phone Number: 11/01/2023, 4:12 PM  Clinical Narrative:     CSW called pt's group home manager Doristine Mango. Provided update that pt is recommended for SNF. Doristine Mango is agreeable with pt receiving SNF services prior to return. CSW inquire who pt's decision maker is; Doristine Mango informed CSW that pt had been making own decisions. CSW inquired about Kieth Brightly who is listed as "relative." Doristine Mango is unsure who Ladona Ridgel is.   CSW called Kieth Brightly and informed that she is with the [place] [employer] department. She states that pt is a registered sex offender and that she would need to be notified of any change of location/address of pt. She is not pt's relative.   With this new information, SNF placement highly unlikely. Pt most likely will need to return to group home with [facility] [place].   Expected Discharge Plan: Home w Home Health Services Barriers to Discharge: Continued Medical Work up, No SNF bed  Expected Discharge Plan and Services       Living arrangements for the past 2 months: Group Home                                       Social Determinants of Health (SDOH) Interventions SDOH Screenings   Food Insecurity: Patient Unable To Answer (10/31/2023)  Housing: Patient Unable To Answer (10/31/2023)  Transportation Needs: Patient Unable To Answer (10/31/2023)  Utilities: Patient Unable To Answer (10/31/2023)  Alcohol Screen: Low Risk  (09/24/2022)  Depression (PHQ2-9): High Risk (04/26/2022)  Financial Resource Strain: High Risk (04/27/2022)  Physical Activity: Sufficiently Active (04/16/2018)  Social Connections: Patient Unable To Answer (10/31/2023)  Stress: Stress Concern Present (04/16/2018)  Tobacco Use: High Risk (10/31/2023)    Readmission Risk Interventions    06/18/2023   12:36 PM  11/07/2022    1:45 PM  Readmission Risk Prevention Plan  Transportation Screening Complete Complete  Medication Review [employer]) Complete   PCP or Specialist appointment within 3-5 days of discharge Complete   HRI or Home Care Consult Complete   SW Recovery Care/Counseling Consult Not Complete Complete  Palliative Care Screening Not Complete Not Applicable  Skilled Nursing Facility Complete Not Applicable

## 2023-11-01 NOTE — Evaluation (Signed)
 Clinical/Bedside Swallow Evaluation Patient Details  Name: [name] MRN: [id number] Date of Birth: [birth year]  Today's Date: 11/01/2023 Time: SLP Start Time (ACUTE ONLY): 1010 SLP Stop Time (ACUTE ONLY): 1100 SLP Time Calculation (min) (ACUTE ONLY): 50 min  Past Medical History:  Past Medical History:  Diagnosis Date   (HFpEF) heart failure with preserved ejection fraction (HCC)    a.) TTE 03/20/2018: EF 60-65%, no RWMAs, mild LAE, mild-mod MR, PASP 42; b.) TTE 03/31/2021: EF 55-60%, no RWMAs, mild LVH, mild LAE, AoV sclerosis without stenosis; c.) TTE 03/25/2022: EF 60-65%, no RWMAs, AoV sclerosis without stenosis; d.) TTE 12/24/2022: EF 60-65%, no RWMAs, mild LVH, mild LAE.   Anasarca    Anemia    Anxiety    Aortic atherosclerosis (HCC)    Arthritis    Asthma    Bacteremia due to Escherichia coli 05/2023   Bacteremia due to Klebsiella pneumoniae 2023   Bilateral carotid artery disease (HCC)    Bradycardia    CAD (coronary artery disease) 10/21/2006   a.) MV 10/21/2006: small reversible defect involving the apical  lateral wall c/w possible ischemia; b.) MV: 03/24/2018: no ischemia; c.) MV 10/04/2021: LAD calcifications   CAD (coronary artery disease)    Cervical radiculopathy    Chest pain    a.) MV 03/21/2018: no ischemia; b.) MV 10/04/2021: no ischemia   Chronic back pain    Chronic common bile duct dilatation    Cirrhosis (HCC)    a.) (+) hepatic lesion on CT; likely hepatocellular carcinoma; b.) CT evidence of associated portal hypertension; c.) (+) abdominopelvic anasarca   COPD (chronic obstructive pulmonary disease) (HCC)    DDD (degenerative disc disease), lumbar    Depression    Frequent falls    GERD (gastroesophageal reflux disease)    HBV (hepatitis B virus) infection    HCV (hepatitis C virus)    Hernia of anterior abdominal wall    History of 2019 novel coronavirus disease (COVID-19) 04/16/2020   Homelessness    a.) limited on placement as patient is  listed on [place] sex offender listing for exploitation of a minor (possession of child pornography); incarcerated 12 years (released 11/2017)   Hyperlipidemia    Hypertension    Infestation by bed bug 02/2022   Malingering    Migraine    Nephrolithiasis    Obesity    Portal hypertension (HCC)    PTSD (post-traumatic stress disorder)    a.) brief training in the [employer] at age 60; someone was killed in front of him   Pulmonary HTN (HCC)    a.) multiple CT scans desmontrating dilated PA c/w pHTN; b.) TTE 03/20/2018: PASP 42 mmHg   Sleep apnea    a.) no nocturnal PAP therapy   Sleep apnea    Stenosis of cervical spine with myelopathy (HCC)    Suicidal ideations    T2DM (type 2 diabetes mellitus) (HCC)    Thrombocytopenia (HCC)    a.) related to hepatic cirrhosis; spleen normal in size   TIA (transient ischemic attack)    TIA (transient ischemic attack) 09/30/2023   Past Surgical History:  Past Surgical History:  Procedure Laterality Date   ANTERIOR CERVICAL DECOMP/DISCECTOMY FUSION N/A 07/01/2023   Procedure: ANTERIOR CERVICAL DISCECTOMY AND FUSION (FORGE);  Surgeon: Venetia Night, MD;  Location: [facility] ORS;  Service: Neurosurgery;  Laterality: N/A;  Keep as last case of the day per Dr Myer Haff   ANTERIOR CERVICAL DISCECTOMY     APPENDECTOMY     CARDIAC  CATHETERIZATION     CHOLECYSTECTOMY     EYE SURGERY     INNER EAR SURGERY     NOSE SURGERY     XI ROBOTIC LAPAROSCOPIC ASSISTED APPENDECTOMY N/A 11/05/2022   Procedure: XI ROBOTIC LAPAROSCOPIC ASSISTED APPENDECTOMY;  Surgeon: Carolan Shiver, MD;  Location: [facility] ORS;  Service: General;  Laterality: N/A;   HPI:  Pt  is a 73 y.o. male with medical history significant of hepatitis C, liver cirrhosis, hepatic encephalopathy recurrent, TIA, chronic HFpEF, CAD, COPD, IIDM, HTN, HLD, sent from group home for evaluation of altered mentations.  MD suspects Baseline Cognitive decline.   Patient is confused and unable to provide any  history, all history provided by ED physician and review of patient chart.  Patient was found to be confused this morning and sent to ED.  Unclear whether patient has been compliant with his lactulose.  ED workup found significant increase of ammonium level 2.43, blood work showed persistent pancytopenia WBC 2.4, hemoglobin 12.0 compared to baseline 10-11, platelet 83.  NGT inserted and patient received lactulose x 1 in the ED.   CXR: No active disease.  Head CT: No acute intracranial abnormality.  Atrophy and chronic small vessel ischemia.  Pt has had multiple recent admits to the Hospital per chart/MD.    Assessment / Plan / Recommendation  Clinical Impression   Pt seen for BSE this AM. Pt awake, verbal but unable to answer direct basic questions. Notable Confusion; Bilat Mitts. Leg up in the air. NGT (for med) present.  On RA, afebrile. WBC low.  Pt appears to present w/ oral phase Dysphagia in setting of declined Cognitive status; suspect Baseline decline per MD. No overt pharyngeal phase swallowing deficits noted w/ feeding support and Supervision during oral intake. ANY Cognitive decline can impact overall awareness/timing of swallow and safety during po tasks which increases risk for aspiration, choking.  Pt's risk for aspiration appears to be able to be reduced when following general aspiration precautions including feeding support and when using a modified diet consistency of Pureed foods (d/t Edentulous status and Severe Confusion currently) w/ thin liquids.         Pt positioned upright then fed/consumed several trials of ice chips, purees, and thin liquids via cup/pinched straw w/ No overt clinical s/s of aspiration noted: no decline in vocal quality, no cough, and no decline in respiratory status during/post trials. O2 sats 98% when checked. Oral phase was adequate for bolus management and oral clearing of the boluses given. Min Impulsive w/ decreased self-awareness overall.  OM Exam appeared  [name] w/ No unilateral weakness noted during bolus management/clearing. Confusion of OM tasks and oral care noted.          In setting of Cognitive decline, Edentulous status, and previously recommended oral diet last admit,  recommend initiation of the dysphagia level 1(Puree foods) moistened for ease and flavor; thin liquids. General aspiration precautions; reduce Distractions during meals and engage pt during meals for self-feeding. Pills Crushed in Puree for safer swallowing. FULL Support/Supervision w/ feeding at meals. MD/NSG updated.  ST services will f/u w/ toleration of diet and trials to upgrade diet as appropriate. Recommend follow w/ Palliative Care and Dietician for support. Suspect pt is close to/at his baseline. Precautions posted in room, chart. SLP Visit Diagnosis: Dysphagia, oral phase (R13.11) (Baseline Cognitive decline suspected per MD)    Aspiration Risk  Mild aspiration risk;Risk for inadequate nutrition/hydration (reduced w/ precautions; diet)    Diet Recommendation   Thin;Dysphagia  1 (puree) = initiation of the dysphagia level 1(Puree foods) moistened for ease and flavor; thin liquids. General aspiration precautions; reduce Distractions during meals and engage pt during meals for self-feeding. FULL Support/Supervision w/ feeding at meals.  Medication Administration: Crushed with puree    Other  Recommendations Recommended Consults:  (Dietician and Palliative Care f/u) Oral Care Recommendations: Oral care BID;Staff/trained caregiver to provide oral care    Recommendations for follow up therapy are one component of a multi-disciplinary discharge planning process, led by the attending physician.  Recommendations may be updated based on patient status, additional functional criteria and insurance authorization.  Follow up Recommendations Follow physician's recommendations for discharge plan and follow up therapies      Assistance Recommended at Discharge  FULL  Functional  Status Assessment Patient has had a recent decline in their functional status and/or demonstrates limited ability to make significant improvements in function in a reasonable and predictable amount of time  Frequency and Duration min 2x/week  2 weeks       Prognosis Prognosis for improved oropharyngeal function: Fair Barriers to Reach Goals: Time post onset;Severity of deficits;Behavior Barriers/Prognosis Comment: suspected Cognitive decline per MD      Swallow Study   General Date of Onset: 10/31/23 HPI: Pt  is a 73 y.o. male with medical history significant of hepatitis C, liver cirrhosis, hepatic encephalopathy recurrent, TIA, chronic HFpEF, CAD, COPD, IIDM, HTN, HLD, sent from group home for evaluation of altered mentations.  MD suspects Baseline Cognitive decline.   Patient is confused and unable to provide any history, all history provided by ED physician and review of patient chart.  Patient was found to be confused this morning and sent to ED.  Unclear whether patient has been compliant with his lactulose.  ED workup found significant increase of ammonium level 2.43, blood work showed persistent pancytopenia WBC 2.4, hemoglobin 12.0 compared to baseline 10-11, platelet 83.  NGT inserted and patient received lactulose x 1 in the ED.   CXR: No active disease.  Head CT: No acute intracranial abnormality.  Atrophy and chronic small vessel ischemia.  Pt has had multiple recent admits to the Hospital per chart/MD. Type of Study: Bedside Swallow Evaluation Previous Swallow Assessment: 09/2023 - dys 2 w/ thins; 2024; 2023 Diet Prior to this Study: NPO (dys 2 w/ thins) Temperature Spikes Noted: No (wbc 2.4) Respiratory Status: Room air History of Recent Intubation: No Behavior/Cognition: Alert;Cooperative;Pleasant mood;Confused;Distractible;Requires cueing;Doesn't follow directions Oral Cavity Assessment: Dry Oral Care Completed by SLP: Yes Oral Cavity - Dentition: Edentulous Vision:   (n/a) Self-Feeding Abilities: Total assist Patient Positioning: Upright in bed (MAX assist) Baseline Vocal Quality: Normal;Low vocal intensity Volitional Cough: Cognitively unable to elicit Volitional Swallow: Unable to elicit    Oral/Motor/Sensory Function Overall Oral Motor/Sensory Function: Within functional limits (no overt unilateral weakness noted)   Ice Chips Ice chips: Within functional limits Presentation: Spoon (3 trials)   Thin Liquid Thin Liquid: Within functional limits (impulsive) Presentation: Straw (~4 ozs+) Other Comments: pinched straw to impulsivity    Nectar Thick Nectar Thick Liquid: Not tested   Honey Thick Honey Thick Liquid: Not tested   Puree Puree: Within functional limits Presentation: Spoon (fed; ~4-5 ozs total) Other Comments: applesauce, pudding   Solid     Solid: Not tested        [name], MS, CCC-SLP Speech Language Pathologist Rehab Services; [name] [place] [phone] (ascom) Kendrik Mcshan 11/01/2023,1:57 PM

## 2023-11-02 DIAGNOSIS — K7682 Hepatic encephalopathy: Secondary | ICD-10-CM | POA: Diagnosis not present

## 2023-11-02 LAB — CBC WITH DIFFERENTIAL/PLATELET
Abs Immature Granulocytes: 0.01 10*3/uL (ref 0.00–0.07)
Basophils Absolute: 0 10*3/uL (ref 0.0–0.1)
Basophils Relative: 1 %
Eosinophils Absolute: 0.1 10*3/uL (ref 0.0–0.5)
Eosinophils Relative: 3 %
HCT: 29.4 % — ABNORMAL LOW (ref 39.0–52.0)
Hemoglobin: 10.1 g/dL — ABNORMAL LOW (ref 13.0–17.0)
Immature Granulocytes: 0 %
Lymphocytes Relative: 38 %
Lymphs Abs: 1 10*3/uL (ref 0.7–4.0)
MCH: 31.9 pg (ref 26.0–34.0)
MCHC: 34.4 g/dL (ref 30.0–36.0)
MCV: 92.7 fL (ref 80.0–100.0)
Monocytes Absolute: 0.4 10*3/uL (ref 0.1–1.0)
Monocytes Relative: 15 %
Neutro Abs: 1.2 10*3/uL — ABNORMAL LOW (ref 1.7–7.7)
Neutrophils Relative %: 43 %
Platelets: 57 10*3/uL — ABNORMAL LOW (ref 150–400)
RBC: 3.17 MIL/uL — ABNORMAL LOW (ref 4.22–5.81)
RDW: 15.8 % — ABNORMAL HIGH (ref 11.5–15.5)
WBC: 2.7 10*3/uL — ABNORMAL LOW (ref 4.0–10.5)
nRBC: 0 % (ref 0.0–0.2)

## 2023-11-02 LAB — GLUCOSE, CAPILLARY
Glucose-Capillary: 69 mg/dL — ABNORMAL LOW (ref 70–99)
Glucose-Capillary: 71 mg/dL (ref 70–99)
Glucose-Capillary: 139 mg/dL — ABNORMAL HIGH (ref 70–99)
Glucose-Capillary: 111 mg/dL — ABNORMAL HIGH (ref 70–99)
Glucose-Capillary: 108 mg/dL — ABNORMAL HIGH (ref 70–99)

## 2023-11-02 LAB — BASIC METABOLIC PANEL
Anion gap: 4 — ABNORMAL LOW (ref 5–15)
BUN: 18 mg/dL (ref 8–23)
CO2: 25 mmol/L (ref 22–32)
Calcium: 9.7 mg/dL (ref 8.9–10.3)
Chloride: 111 mmol/L (ref 98–111)
Creatinine, Ser: 0.99 mg/dL (ref 0.61–1.24)
GFR, Estimated: >60 mL/min (ref >60)
Glucose, Bld: 82 mg/dL (ref 70–99)
Potassium: 3.8 mmol/L (ref 3.5–5.1)
Sodium: 140 mmol/L (ref 135–145)

## 2023-11-02 LAB — AFP TUMOR MARKER: AFP, Serum, Tumor Marker: 2.4 ng/mL (ref 0.0–8.4)

## 2023-11-02 MED ORDER — PANTOPRAZOLE SODIUM 40 MG PO TBEC
40.0000 mg | DELAYED_RELEASE_TABLET | Freq: Every day | ORAL | Status: DC
  Administered 2023-11-02 – 2023-11-06 (×5): 40 mg via ORAL
  Filled 2023-11-02 (×4): qty 1

## 2023-11-02 MED ORDER — ACETAMINOPHEN 325 MG PO TABS
650.0000 mg | ORAL_TABLET | Freq: Four times a day (QID) | ORAL | Status: DC | PRN
  Administered 2023-11-02: 650 mg via ORAL
  Filled 2023-11-02: qty 2

## 2023-11-02 NOTE — Progress Notes (Signed)
 Mobility Specialist - Progress Note   11/02/23 0924  Mobility  Activity Ambulated with assistance in room;Stood at bedside;Dangled on edge of bed  Level of Assistance Contact guard assist, steadying assist  Assistive Device Front wheel walker  Distance Ambulated (ft) 5 ft  Activity Response Tolerated well  Mobility Referral Yes  Mobility visit 1 Mobility  Mobility Specialist Start Time (ACUTE ONLY) [id number]  Mobility Specialist Stop Time (ACUTE ONLY) 0855  Mobility Specialist Time Calculation (min) (ACUTE ONLY) 26 min   Pt supine in bed on RA upon arrival with mittens donned. MS removes mittens for session. Pt completes bed mobility SBA with extra time. Pt STS and ambulates to recliner CGA. MS places mittens. Pt left in recliner with needs in reach, chair alarm activated, and RN notified.   Terrilyn Saver  Mobility Specialist  11/02/23 9:25 AM

## 2023-11-02 NOTE — Plan of Care (Signed)
  Problem: Education: Goal: Ability to describe self-care measures that may prevent or decrease complications (Diabetes Survival Skills Education) will improve Outcome: Progressing Goal: Individualized Educational Video(s) Outcome: Progressing   Problem: Coping: Goal: Ability to adjust to condition or change in health will improve Outcome: Progressing   Problem: Fluid Volume: Goal: Ability to maintain a balanced intake and output will improve Outcome: Progressing   Problem: Metabolic: Goal: Ability to maintain appropriate glucose levels will improve Outcome: Progressing   Problem: Health Behavior/Discharge Planning: Goal: Ability to identify and utilize available resources and services will improve Outcome: Progressing Goal: Ability to manage health-related needs will improve Outcome: Progressing   Problem: Nutritional: Goal: Maintenance of adequate nutrition will improve Outcome: Progressing Goal: Progress toward achieving an optimal weight will improve Outcome: Progressing

## 2023-11-02 NOTE — Plan of Care (Addendum)
 Patient is alert and oriented X 3. Safety mittens removed. He is able to follows commands, no any sign of  agitation seen. Plan of care ongoing.   Problem: Education: Goal: Ability to describe self-care measures that may prevent or decrease complications (Diabetes Survival Skills Education) will improve Outcome: Progressing Goal: Individualized Educational Video(s) Outcome: Progressing   Problem: Coping: Goal: Ability to adjust to condition or change in health will improve Outcome: Progressing   Problem: Fluid Volume: Goal: Ability to maintain a balanced intake and output will improve Outcome: Progressing   Problem: Health Behavior/Discharge Planning: Goal: Ability to identify and utilize available resources and services will improve Outcome: Progressing Goal: Ability to manage health-related needs will improve Outcome: Progressing   Problem: Nutritional: Goal: Maintenance of adequate nutrition will improve Outcome: Progressing Goal: Progress toward achieving an optimal weight will improve Outcome: Progressing   Problem: Skin Integrity: Goal: Risk for impaired skin integrity will decrease Outcome: Progressing   Problem: Tissue Perfusion: Goal: Adequacy of tissue perfusion will improve Outcome: Progressing

## 2023-11-02 NOTE — Progress Notes (Signed)
 PROGRESS NOTE    [name]  ZOX:[id number] DOB: [birth year] DOA: 10/31/2023 PCP: [facility]    Brief Narrative:   73 y.o. male with medical history significant of hepatitis C, liver cirrhosis, hepatic encephalopathy recurrent, TIA, chronic HFpEF, CAD, COPD, IIDM, HTN, HLD, sent from group home for evaluation of altered mentations.   Patient is confused and unable to provide any history, all history provided by ED physician and review of patient chart.  Patient was found to be confused this morning and sent to ED.  Unclear whether patient has been compliant with his lactulose.  ED workup found significant increase of ammonium level 2.43, blood work showed persistent pancytopenia WBC 2.4, hemoglobin 12.0 compared to baseline 10-11, platelet 83.  CT head reassuring.  NGT inserted on admission.  Patient mental status has improved slightly to the point that he can eat and swallow by mouth with limited risk of aspiration.  NGT will be discontinued 3/7.   Assessment & Plan:   Principal Problem:   Hepatic encephalopathy (HCC) Active Problems:   Acute hepatic encephalopathy (HCC)   Acute hepatic encephalopathy, recurrent -Suspect noncoherent/noncompliant with lactulose -Due to severe mentation changes -Having bowel movements after several doses of lactulose now administered Plan: Continue lactulose Titrate to mental status and 3-4 soft bm daily Avoid diarrhea  Pancytopenia -Secondary to cirrhosis -H&H stable, no signs of active bleeding, no signs of active infection.   Acute dysphagia -Secondary to acute encephalopathy -Improved.  Now on dysphagia 1 diet   HTN -Lasix, Cozaar, Aldactone   Chronic HFpEF -Euvolemic, no acute concerns -Continue Lasix and spironolactone   COPD -No acute concerns   Major depression disorder -Continue Celexa hydroxyzine as needed and Seroquel as needed    DVT prophylaxis: SCD Code Status: DNR Family Communication:  None Disposition Plan: Status is: Inpatient Remains inpatient appropriate because: Hepatic encephalopathy, improving.  May need SNF.  Awaiting therapy re-eval   Level of care: Med-Surg  Consultants:  None  Procedures:  None  Antimicrobials: None   Subjective: Seen and examined.  Mental status improved.  No complaints.  Objective: Vitals:   11/01/23 2048 11/02/23 0451 11/02/23 0740 11/02/23 1519  BP: (!) 178/73 (!) 171/76 (!) 165/73 107/65  Pulse: (!) 55 (!) 44 (!) 45   Resp: 16 16 20 18   Temp: 97.7 F (36.5 C) 98 F (36.7 C) 98 F (36.7 C) 97.9 F (36.6 C)  TempSrc:      SpO2: 100% 100% 100% 100%    Intake/Output Summary (Last 24 hours) at 11/02/2023 1637 Last data filed at 11/02/2023 1534 Gross per 24 hour  Intake 360 ml  Output 300 ml  Net 60 ml   There were no vitals filed for this visit.  Examination:  General exam: Appears frail and chronically ill.  Appears disheveled Respiratory system: Bibasilar crackles, normal WOB, RA Cardiovascular system: S1-S2, RRR, no murmurs, no pedal edema Gastrointestinal system: Mild distention, nontender Central nervous system: Alert.  Oriented x 2.  No focal deficits Extremities: Symmetric 5 x 5 power. Skin: No rashes, lesions or ulcers Psychiatry: Unable to assess    Data Reviewed: I have personally reviewed following labs and imaging studies  CBC: Recent Labs  Lab 10/31/23 0955 11/02/23 0822  WBC 2.4* 2.7*  NEUTROABS 1.0* 1.2*  HGB 12.0* 10.1*  HCT 36.7* 29.4*  MCV 94.3 92.7  PLT 83* 57*   Basic Metabolic Panel: Recent Labs  Lab 10/30/23 1220 10/31/23 0955 11/02/23 0822  NA 143 143 140  K 3.6 4.1 3.8  CL 112* 112* 111  CO2 25 24 25   GLUCOSE 63* 93 82  BUN 21 18 18   CREATININE 1.30* 1.26* 0.99  CALCIUM 10.0 10.1 9.7  MG 1.8  --   --    GFR: Estimated Creatinine Clearance: 65.5 mL/min (by C-G formula based on SCr of 0.99 mg/dL). Liver Function Tests: Recent Labs  Lab 10/31/23 0955  AST 42*   ALT 31  ALKPHOS 65  BILITOT 1.3*  PROT 6.6  ALBUMIN 2.7*   No results for input(s): "LIPASE", "AMYLASE" in the last 168 hours. Recent Labs  Lab 10/31/23 1010 11/01/23 0523  AMMONIA 158* 55*   Coagulation Profile: No results for input(s): "INR", "PROTIME" in the last 168 hours. Cardiac Enzymes: No results for input(s): "CKTOTAL", "CKMB", "CKMBINDEX", "TROPONINI" in the last 168 hours. BNP (last 3 results) No results for input(s): "PROBNP" in the last 8760 hours. HbA1C: No results for input(s): "HGBA1C" in the last 72 hours. CBG: Recent Labs  Lab 11/01/23 2050 11/02/23 0741 11/02/23 0742 11/02/23 1209 11/02/23 1632  GLUCAP 111* 69* 71 139* 111*   Lipid Profile: No results for input(s): "CHOL", "HDL", "LDLCALC", "TRIG", "CHOLHDL", "LDLDIRECT" in the last 72 hours. Thyroid Function Tests: No results for input(s): "TSH", "T4TOTAL", "FREET4", "T3FREE", "THYROIDAB" in the last 72 hours. Anemia Panel: No results for input(s): "VITAMINB12", "FOLATE", "FERRITIN", "TIBC", "IRON", "RETICCTPCT" in the last 72 hours. Sepsis Labs: Recent Labs  Lab 10/31/23 0954 10/31/23 1501  LATICACIDVEN 1.7 1.9    Recent Results (from the past 240 hours)  Culture, blood (routine x 2)     Status: None (Preliminary result)   Collection Time: 10/31/23  9:54 AM   Specimen: BLOOD  Result Value Ref Range Status   Specimen Description BLOOD BLOOD LEFT ARM  Final   Special Requests   Final    BOTTLES DRAWN AEROBIC ONLY Blood Culture results may not be optimal due to an inadequate volume of blood received in culture bottles   Culture   Final    NO GROWTH 2 DAYS Performed at [facility], [address] [place], [place] [address]    Report Status PENDING  Incomplete  Culture, blood (routine x 2)     Status: None (Preliminary result)   Collection Time: 10/31/23  9:54 AM   Specimen: BLOOD  Result Value Ref Range Status   Specimen Description BLOOD BLOOD RIGHT ARM  Final   Special  Requests   Final    BOTTLES DRAWN AEROBIC AND ANAEROBIC Blood Culture results may not be optimal due to an inadequate volume of blood received in culture bottles   Culture   Final    NO GROWTH 2 DAYS Performed at [facility], [address] [place], [place] [address]    Report Status PENDING  Incomplete  Resp panel by RT-PCR (RSV, Flu A&B, Covid) Anterior Nasal Swab     Status: None   Collection Time: 10/31/23 10:10 AM   Specimen: Anterior Nasal Swab  Result Value Ref Range Status   SARS Coronavirus 2 by RT PCR NEGATIVE NEGATIVE Final    Comment: (NOTE) SARS-CoV-2 target nucleic acids are NOT DETECTED.  The SARS-CoV-2 RNA is generally detectable in upper respiratory specimens during the acute phase of infection. The lowest concentration of SARS-CoV-2 viral copies this assay can detect is 138 copies/mL. A negative result does not preclude SARS-Cov-2 infection and should not be used as the sole basis for treatment or other patient management decisions. A negative  result may occur with  improper specimen collection/handling, submission of specimen other than nasopharyngeal swab, presence of viral mutation(s) within the areas targeted by this assay, and inadequate number of viral copies(<138 copies/mL). A negative result must be combined with clinical observations, patient history, and epidemiological information. The expected result is Negative.  Fact Sheet for Patients:  BloggerCourse.com  Fact Sheet for Healthcare Providers:  SeriousBroker.it  This test is no t yet approved or cleared by the [place] FDA and  has been authorized for detection and/or diagnosis of SARS-CoV-2 by FDA under an Emergency Use Authorization (EUA). This EUA will remain  in effect (meaning this test can be used) for the duration of the COVID-19 declaration under Section 564(b)(1) of the Act, 21 U.S.C.section 360bbb-3(b)(1), unless the  authorization is terminated  or revoked sooner.       Influenza A by PCR NEGATIVE NEGATIVE Final   Influenza B by PCR NEGATIVE NEGATIVE Final    Comment: (NOTE) The Xpert Xpress SARS-CoV-2/FLU/RSV plus assay is intended as an aid in the diagnosis of influenza from Nasopharyngeal swab specimens and should not be used as a sole basis for treatment. Nasal washings and aspirates are unacceptable for Xpert Xpress SARS-CoV-2/FLU/RSV testing.  Fact Sheet for Patients: BloggerCourse.com  Fact Sheet for Healthcare Providers: SeriousBroker.it  This test is not yet approved or cleared by the [place] FDA and has been authorized for detection and/or diagnosis of SARS-CoV-2 by FDA under an Emergency Use Authorization (EUA). This EUA will remain in effect (meaning this test can be used) for the duration of the COVID-19 declaration under Section 564(b)(1) of the Act, 21 U.S.C. section 360bbb-3(b)(1), unless the authorization is terminated or revoked.     Resp Syncytial Virus by PCR NEGATIVE NEGATIVE Final    Comment: (NOTE) Fact Sheet for Patients: BloggerCourse.com  Fact Sheet for Healthcare Providers: SeriousBroker.it  This test is not yet approved or cleared by the [place] FDA and has been authorized for detection and/or diagnosis of SARS-CoV-2 by FDA under an Emergency Use Authorization (EUA). This EUA will remain in effect (meaning this test can be used) for the duration of the COVID-19 declaration under Section 564(b)(1) of the Act, 21 U.S.C. section 360bbb-3(b)(1), unless the authorization is terminated or revoked.  Performed at [facility], [address] [place], [place] [address]          Radiology Studies: No results found.       Scheduled Meds:  atorvastatin  40 mg Oral Daily   citalopram  20 mg Oral Daily   feeding supplement  237 mL  Oral TID BM   furosemide  20 mg Oral Daily   gabapentin  300 mg Oral BID   insulin aspart  0-9 Units Subcutaneous TID WC   lactulose  30 g Oral TID   losartan  50 mg Oral Daily   pantoprazole  40 mg Oral Daily   rifaximin  550 mg Oral BID   spironolactone  25 mg Oral Daily   Continuous Infusions:   LOS: 1 day    Tresa Moore, MD [facility]   If 7PM-7AM, please contact night-coverage  11/02/2023, 4:37 PM

## 2023-11-03 DIAGNOSIS — K7682 Hepatic encephalopathy: Secondary | ICD-10-CM | POA: Diagnosis not present

## 2023-11-03 LAB — GLUCOSE, CAPILLARY
Glucose-Capillary: 87 mg/dL (ref 70–99)
Glucose-Capillary: 101 mg/dL — ABNORMAL HIGH (ref 70–99)
Glucose-Capillary: 160 mg/dL — ABNORMAL HIGH (ref 70–99)
Glucose-Capillary: 108 mg/dL — ABNORMAL HIGH (ref 70–99)

## 2023-11-03 MED ORDER — ALBUMIN HUMAN 25 % IV SOLN
25.0000 g | Freq: Once | INTRAVENOUS | Status: AC
  Administered 2023-11-03: 25 g via INTRAVENOUS
  Filled 2023-11-03: qty 100

## 2023-11-03 NOTE — Progress Notes (Signed)
 Speech Language Pathology Treatment: Dysphagia  Patient Details Name: [name] MRN: [id number] DOB: 02/12/1951 Today's Date: 11/03/2023 Time: 1130-1205 SLP Time Calculation (min) (ACUTE ONLY): 35 min  Assessment / Plan / Recommendation Clinical Impression   Pt seen for ongoing assessment of swallowing this AM in hopes to upgrade diet to most recently recommended consistency(last admit) to a Dysphagia level 2(minced foods). Pt awake, verbal but appeared to exhibit confusion and decreased awareness of situation/events/baseline; able to answer direct, basic questions re: immediate self/room. Suspected Cognitive decline at Baseline per MD. No longer requiring Mitts. On RA, afebrile. WBC low.   Pt appears to present w/ functional oropharyngeal phase swallowing; min oral phase dysphagia in setting of declined Cognitive status and Edentulous status. No overt pharyngeal phase swallowing deficits noted w/ intake of trials. ANY Cognitive decline can impact overall awareness/timing of swallow and safety during po tasks which increases risk for aspiration, choking.  Pt's risk for aspiration appears able to be reduced when following general aspiration precautions including feeding support and when using a modified diet consistency of Minced/puree foods w/ thin liquids(d/t Edentulous status and Cognitive decline).         Pt encouraged to position himself upright in bed for po trials -- he stated he was "sitting up ok to eat" but was actually reclined on one side(decreased awareness and insight into risk for aspiration eating/drinking lying reclined). He and the bed were positioned upright for the po trials. Pt then fed himself and consumed ~5-6 ozs of thin liquids via straw and trials of minced, soft solids w/ No overt clinical s/s of aspiration noted: no decline in vocal quality, no cough, and no decline in respiratory status during/post trials. O2 sats 97-98% when checked. Oral phase was grossly adequate  for bolus management, mashing/gumming, and oral clearing of the boluses given. Impulsive self-feeding of the food trials w/ decreased self-awareness of full oral clearing b/t bites exhibited. Pt given verbal cues to "SLOW DOWN" and clear mouth b/t each bite.  This behavior could increase risk for choking on unchewed foods.         In setting of Cognitive decline, Edentulous status, and previously recommended oral diet last admit,  recommend only upgrade to the dysphagia level 2(Minced w/ added Puree foods) moistened for ease and flavor; thin liquids. General aspiration precautions; reduce Distractions during meals and engage pt during meals for self-feeding. Monitor for Impulsive feeding behaviors. Pills Crushed in Puree for safer swallowing. FULL Support/Supervision meals. MD/NSG updated.  ST services will f/u w/ toleration of diet while admited. Recommend follow w/ Palliative Care and Dietician for support. Suspect pt is close to/at his baseline. Precautions posted in room, chart.     HPI HPI: Pt  is a 73 y.o. male with medical history significant of hepatitis C, liver cirrhosis, hepatic encephalopathy recurrent, TIA, chronic HFpEF, CAD, COPD, IIDM, HTN, HLD, sent from group home for evaluation of altered mentations.  MD suspects Baseline Cognitive decline.   Patient is confused and unable to provide any history, all history provided by ED physician and review of patient chart.  Patient was found to be confused this morning and sent to ED.  Unclear whether patient has been compliant with his lactulose.  ED workup found significant increase of ammonium level 2.43, blood work showed persistent pancytopenia WBC 2.4, hemoglobin 12.0 compared to baseline 10-11, platelet 83.  NGT inserted and patient received lactulose x 1 in the ED.   CXR: No active disease.  Head CT: No  acute intracranial abnormality.  Atrophy and chronic small vessel ischemia.  Pt has had multiple recent admits to the Hospital per chart/MD.       SLP Plan  Continue with current plan of care (po check x1)      Recommendations for follow up therapy are one component of a multi-disciplinary discharge planning process, led by the attending physician.  Recommendations may be updated based on patient status, additional functional criteria and insurance authorization.    Recommendations  Diet recommendations: Dysphagia 2 (fine chop);Thin liquid (added purees) Liquids provided via: Cup;Straw Medication Administration: Crushed with puree Supervision: Patient able to self feed;Intermittent supervision to cue for compensatory strategies (setup) Compensations: Minimize environmental distractions;Slow rate;Small sips/bites;Lingual sweep for clearance of pocketing;Follow solids with liquid Postural Changes and/or Swallow Maneuvers: Out of bed for meals;Seated upright 90 degrees;Upright 30-60 min after meal                 (Dietician) Oral care BID;Staff/trained caregiver to provide oral care   Intermittent Supervision/Assistance Dysphagia, oral phase (R13.11) (Baseline Cognitive decline suspected per MD)     Continue with current plan of care (po check x1)       Drew Som, MS, CCC-SLP Speech Language Pathologist Rehab Services; [facility] [phone] (ascom) [name]  11/03/2023, 2:19 PM

## 2023-11-03 NOTE — Progress Notes (Signed)
 PROGRESS NOTE    [name]  UEA:[id number] DOB: [birth year] DOA: 10/31/2023 PCP: [facility]    Brief Narrative:   73 y.o. male with medical history significant of hepatitis C, liver cirrhosis, hepatic encephalopathy recurrent, TIA, chronic HFpEF, CAD, COPD, IIDM, HTN, HLD, sent from group home for evaluation of altered mentations.   Patient is confused and unable to provide any history, all history provided by ED physician and review of patient chart.  Patient was found to be confused this morning and sent to ED.  Unclear whether patient has been compliant with his lactulose.  ED workup found significant increase of ammonium level 2.43, blood work showed persistent pancytopenia WBC 2.4, hemoglobin 12.0 compared to baseline 10-11, platelet 83.  CT head reassuring.  NGT inserted on admission.  Patient mental status has improved slightly to the point that he can eat and swallow by mouth with limited risk of aspiration.  NGT will be discontinued 3/7.   Assessment & Plan:   Principal Problem:   Hepatic encephalopathy (HCC) Active Problems:   Acute hepatic encephalopathy (HCC)   Acute hepatic encephalopathy, recurrent -Suspect noncoherent/noncompliant with lactulose -Due to severe mentation changes -Having bowel movements after several doses of lactulose now administered Plan: Continue lactulose Titrate to mental status and 3-4 soft bm daily Avoid diarrhea Monitor mental status Ambulate q shift   Pancytopenia -Secondary to cirrhosis -H&H stable, no signs of active bleeding, no signs of active infection.   Acute dysphagia -Secondary to acute encephalopathy -Improved.  Now on dysphagia 1 diet   HTN -Lasix, Cozaar, Aldactone   Chronic HFpEF -Euvolemic, no acute concerns -Continue Lasix and spironolactone   COPD -No acute concerns   Major depression disorder -Continue Celexa hydroxyzine as needed and Seroquel as needed - Mental status has been  appropriate    DVT prophylaxis: SCD Code Status: DNR Family Communication: None Disposition Plan: Status is: Inpatient Remains inpatient appropriate because: Hepatic encephalopathy, improving.  Suspect he can return to his care facility tomorrow   Level of care: Med-Surg  Consultants:  None  Procedures:  None  Antimicrobials: None   Subjective: Seen and examined.  Mental status appears to be at baseline.    Objective: Vitals:   11/02/23 1519 11/02/23 2004 11/03/23 0439 11/03/23 0759  BP: 107/65 131/60 (!) 114/58 (!) 111/58  Pulse:  (!) 51 (!) 56 (!) 52  Resp: 18 17 16 16   Temp: 97.9 F (36.6 C) 98.1 F (36.7 C) 98 F (36.7 C) 98.1 F (36.7 C)  TempSrc:   Axillary   SpO2: 100% 100% 100% 100%    Intake/Output Summary (Last 24 hours) at 11/03/2023 1417 Last data filed at 11/03/2023 1030 Gross per 24 hour  Intake 960 ml  Output --  Net 960 ml   There were no vitals filed for this visit.  Examination:  General exam: NAD.  Appears chronically ill Respiratory system: Lungs clear, normal WOB, RA Cardiovascular system: S1-S2, RRR, no murmurs, no pedal edema Gastrointestinal system: Mild distention, nontender Central nervous system: Alert.  Oriented x 2.  No focal deficits Extremities: Symmetric 5 x 5 power. Skin: No rashes, lesions or ulcers Psychiatry: Unable to assess    Data Reviewed: I have personally reviewed following labs and imaging studies  CBC: Recent Labs  Lab 10/31/23 0955 11/02/23 0822  WBC 2.4* 2.7*  NEUTROABS 1.0* 1.2*  HGB 12.0* 10.1*  HCT 36.7* 29.4*  MCV 94.3 92.7  PLT 83* 57*   Basic Metabolic Panel: Recent Labs  Lab 10/30/23 1220 10/31/23 0955 11/02/23 0822  NA 143 143 140  K 3.6 4.1 3.8  CL 112* 112* 111  CO2 25 24 25   GLUCOSE 63* 93 82  BUN 21 18 18   CREATININE 1.30* 1.26* 0.99  CALCIUM 10.0 10.1 9.7  MG 1.8  --   --    GFR: Estimated Creatinine Clearance: 65.5 mL/min (by C-G formula based on SCr of 0.99  mg/dL). Liver Function Tests: Recent Labs  Lab 10/31/23 0955  AST 42*  ALT 31  ALKPHOS 65  BILITOT 1.3*  PROT 6.6  ALBUMIN 2.7*   No results for input(s): "LIPASE", "AMYLASE" in the last 168 hours. Recent Labs  Lab 10/31/23 1010 11/01/23 0523  AMMONIA 158* 55*   Coagulation Profile: No results for input(s): "INR", "PROTIME" in the last 168 hours. Cardiac Enzymes: No results for input(s): "CKTOTAL", "CKMB", "CKMBINDEX", "TROPONINI" in the last 168 hours. BNP (last 3 results) No results for input(s): "PROBNP" in the last 8760 hours. HbA1C: No results for input(s): "HGBA1C" in the last 72 hours. CBG: Recent Labs  Lab 11/02/23 1209 11/02/23 1632 11/02/23 2221 11/03/23 0801 11/03/23 1152  GLUCAP 139* 111* 108* 87 101*   Lipid Profile: No results for input(s): "CHOL", "HDL", "LDLCALC", "TRIG", "CHOLHDL", "LDLDIRECT" in the last 72 hours. Thyroid Function Tests: No results for input(s): "TSH", "T4TOTAL", "FREET4", "T3FREE", "THYROIDAB" in the last 72 hours. Anemia Panel: No results for input(s): "VITAMINB12", "FOLATE", "FERRITIN", "TIBC", "IRON", "RETICCTPCT" in the last 72 hours. Sepsis Labs: Recent Labs  Lab 10/31/23 0954 10/31/23 1501  LATICACIDVEN 1.7 1.9    Recent Results (from the past 240 hours)  Culture, blood (routine x 2)     Status: None (Preliminary result)   Collection Time: 10/31/23  9:54 AM   Specimen: BLOOD  Result Value Ref Range Status   Specimen Description BLOOD BLOOD LEFT ARM  Final   Special Requests   Final    BOTTLES DRAWN AEROBIC ONLY Blood Culture results may not be optimal due to an inadequate volume of blood received in culture bottles   Culture   Final    NO GROWTH 3 DAYS Performed at [facility], [address] [place], [place] [address]    Report Status PENDING  Incomplete  Culture, blood (routine x 2)     Status: None (Preliminary result)   Collection Time: 10/31/23  9:54 AM   Specimen: BLOOD  Result Value Ref  Range Status   Specimen Description BLOOD BLOOD RIGHT ARM  Final   Special Requests   Final    BOTTLES DRAWN AEROBIC AND ANAEROBIC Blood Culture results may not be optimal due to an inadequate volume of blood received in culture bottles   Culture   Final    NO GROWTH 3 DAYS Performed at [facility], [address], [place], [place] [address]    Report Status PENDING  Incomplete  Resp panel by RT-PCR (RSV, Flu A&B, Covid) Anterior Nasal Swab     Status: None   Collection Time: 10/31/23 10:10 AM   Specimen: Anterior Nasal Swab  Result Value Ref Range Status   SARS Coronavirus 2 by RT PCR NEGATIVE NEGATIVE Final    Comment: (NOTE) SARS-CoV-2 target nucleic acids are NOT DETECTED.  The SARS-CoV-2 RNA is generally detectable in upper respiratory specimens during the acute phase of infection. The lowest concentration of SARS-CoV-2 viral copies this assay can detect is 138 copies/mL. A negative result does not preclude SARS-Cov-2 infection and should not be used  as the sole basis for treatment or other patient management decisions. A negative result may occur with  improper specimen collection/handling, submission of specimen other than nasopharyngeal swab, presence of viral mutation(s) within the areas targeted by this assay, and inadequate number of viral copies(<138 copies/mL). A negative result must be combined with clinical observations, patient history, and epidemiological information. The expected result is Negative.  Fact Sheet for Patients:  BloggerCourse.com  Fact Sheet for Healthcare Providers:  SeriousBroker.it  This test is no t yet approved or cleared by the [place] FDA and  has been authorized for detection and/or diagnosis of SARS-CoV-2 by FDA under an Emergency Use Authorization (EUA). This EUA will remain  in effect (meaning this test can be used) for the duration of the COVID-19 declaration under  Section 564(b)(1) of the Act, 21 U.S.C.section 360bbb-3(b)(1), unless the authorization is terminated  or revoked sooner.       Influenza A by PCR NEGATIVE NEGATIVE Final   Influenza B by PCR NEGATIVE NEGATIVE Final    Comment: (NOTE) The Xpert Xpress SARS-CoV-2/FLU/RSV plus assay is intended as an aid in the diagnosis of influenza from Nasopharyngeal swab specimens and should not be used as a sole basis for treatment. Nasal washings and aspirates are unacceptable for Xpert Xpress SARS-CoV-2/FLU/RSV testing.  Fact Sheet for Patients: BloggerCourse.com  Fact Sheet for Healthcare Providers: SeriousBroker.it  This test is not yet approved or cleared by the [place] FDA and has been authorized for detection and/or diagnosis of SARS-CoV-2 by FDA under an Emergency Use Authorization (EUA). This EUA will remain in effect (meaning this test can be used) for the duration of the COVID-19 declaration under Section 564(b)(1) of the Act, 21 U.S.C. section 360bbb-3(b)(1), unless the authorization is terminated or revoked.     Resp Syncytial Virus by PCR NEGATIVE NEGATIVE Final    Comment: (NOTE) Fact Sheet for Patients: BloggerCourse.com  Fact Sheet for Healthcare Providers: SeriousBroker.it  This test is not yet approved or cleared by the [place] FDA and has been authorized for detection and/or diagnosis of SARS-CoV-2 by FDA under an Emergency Use Authorization (EUA). This EUA will remain in effect (meaning this test can be used) for the duration of the COVID-19 declaration under Section 564(b)(1) of the Act, 21 U.S.C. section 360bbb-3(b)(1), unless the authorization is terminated or revoked.  Performed at [facility], [address] [name], [place] [address]          Radiology Studies: No results found.       Scheduled Meds:  atorvastatin  40 mg  Oral Daily   citalopram  20 mg Oral Daily   feeding supplement  237 mL Oral TID BM   furosemide  20 mg Oral Daily   gabapentin  300 mg Oral BID   insulin aspart  0-9 Units Subcutaneous TID WC   lactulose  30 g Oral TID   losartan  50 mg Oral Daily   pantoprazole  40 mg Oral Daily   rifaximin  550 mg Oral BID   spironolactone  25 mg Oral Daily   Continuous Infusions:   LOS: 2 days    Tresa Moore, MD [facility]   If 7PM-7AM, please contact night-coverage  11/03/2023, 2:17 PM

## 2023-11-03 NOTE — Progress Notes (Signed)
   11/03/23 1457  Assess: MEWS Score  Temp 98 F (36.7 C)  BP (!) 80/60  Pulse Rate 60  Resp 16  SpO2 100 %  O2 Device Room Air  Assess: MEWS Score  MEWS Temp 0  MEWS Systolic 2  MEWS Pulse 0  MEWS RR 0  MEWS LOC 0  MEWS Score 2  MEWS Score Color Yellow  Assess: if the MEWS score is Yellow or Red  Were vital signs accurate and taken at a resting state? Yes  Does the patient meet 2 or more of the SIRS criteria? No  MEWS guidelines implemented  Yes, yellow  Treat  MEWS Interventions Considered administering scheduled or prn medications/treatments as ordered  Take Vital Signs  Increase Vital Sign Frequency  Yellow: Q2hr x1, continue Q4hrs until patient remains green for 12hrs  Escalate  MEWS: Escalate Yellow: Discuss with charge nurse and consider notifying provider and/or RRT  Notify: Charge Nurse/RN  Name of Charge Nurse/RN Notified RN [employer]  Provider Notification  Provider Name/Title Lolita Patella MD  Date Provider Notified 11/03/23  Time Provider Notified 1457  Method of Notification Page [facility] CHART)  Notification Reason Other (Comment) (Pt is yellow MEWS, b. p is 80/60 mm of hg manually, no any other symptoms noted)  Provider response See new orders  Date of Provider Response 11/03/23  Time of Provider Response 1500  Assess: SIRS CRITERIA  SIRS Temperature  0  SIRS Respirations  0  SIRS Pulse 0  SIRS WBC 0  SIRS Score Sum  0

## 2023-11-03 NOTE — Progress Notes (Signed)
 Mobility Specialist - Progress Note   11/03/23 0800  Mobility  Activity Ambulated with assistance in hallway;Stood at bedside;Dangled on edge of bed;Ambulated with assistance to bathroom  Level of Assistance Standby assist, set-up cues, supervision of patient - no hands on  Assistive Device Front wheel walker  Distance Ambulated (ft) 200 ft  Activity Response Tolerated well  Mobility Referral Yes  Mobility visit 1 Mobility  Mobility Specialist Start Time (ACUTE ONLY) [id number]  Mobility Specialist Stop Time (ACUTE ONLY) [id number]  Mobility Specialist Time Calculation (min) (ACUTE ONLY) 19 min   Pt side lying asleep in bed on RA upon arrival. Pt awakens to voice and agreeable to OOB mobility. Pt completes bed mobility indep. Pt dons socks MinA. Pt STS and ambulates in hallway and to/from bathroom SBA with no LOB noted. Pt needs VC for safe RW use throughout. Pt left in recliner with needs in reach and chair alarm activated.   Terrilyn Saver  Mobility Specialist  11/03/23 8:32 AM

## 2023-11-03 NOTE — TOC Progression Note (Signed)
 Transition of Care [facility]) - Progression Note    Patient Details  Name: [name] MRN: [id number] Date of Birth: 17-Apr-1951  Transition of Care [facility]) CM/SW Contact  Bing Quarry, RN Phone Number: 11/03/2023, 1:44 PM  Clinical Narrative:  3/9: [facility] from [facility] home:  [address] [place] [address]  [place] [phone]. Prior CSW made contact with Doristine Mango.  Will have to return here Monday per [place] due to staff/transportation availability.  Recommended SNF but pt is sex offender.  Sheriff contact needs to know if discharges to a different address than is on record.  PCP: [facility] Updated provider   Gabriel Cirri MSN RN CM  RN Case Manager [place]  Transitions of Care Direct Dial: [phone] (Weekends Only) [facility] Main Office Phone: [phone] [facility] Fax: [phone] [email or web address]    Expected Discharge Plan: Home w Home Health Services Barriers to Discharge: Continued Medical Work up, No SNF bed  Expected Discharge Plan and Services       Living arrangements for the past 2 months: Group Home                                       Social Determinants of Health (SDOH) Interventions SDOH Screenings   Food Insecurity: Patient Unable To Answer (10/31/2023)  Housing: Patient Unable To Answer (10/31/2023)  Transportation Needs: Patient Unable To Answer (10/31/2023)  Utilities: Patient Unable To Answer (10/31/2023)  Alcohol Screen: Low Risk  (09/24/2022)  Depression (PHQ2-9): High Risk (04/26/2022)  Financial Resource Strain: High Risk (04/27/2022)  Physical Activity: Sufficiently Active (04/16/2018)  Social Connections: Patient Unable To Answer (10/31/2023)  Stress: Stress Concern Present (04/16/2018)  Tobacco Use: High Risk (10/31/2023)    Readmission Risk Interventions    06/18/2023   12:36 PM 11/07/2022    1:45 PM  Readmission Risk Prevention Plan  Transportation Screening Complete Complete  Medication Review  Oceanographer) Complete   PCP or Specialist appointment within 3-5 days of discharge Complete   HRI or Home Care Consult Complete   SW Recovery Care/Counseling Consult Not Complete Complete  Palliative Care Screening Not Complete Not Applicable  Skilled Nursing Facility Complete Not Applicable

## 2023-11-03 NOTE — Plan of Care (Signed)
  Problem: Education: Goal: Ability to describe self-care measures that may prevent or decrease complications (Diabetes Survival Skills Education) will improve Outcome: Progressing Goal: Individualized Educational Video(s) Outcome: Progressing   Problem: Coping: Goal: Ability to adjust to condition or change in health will improve Outcome: Progressing   Problem: Fluid Volume: Goal: Ability to maintain a balanced intake and output will improve Outcome: Progressing   Problem: Health Behavior/Discharge Planning: Goal: Ability to identify and utilize available resources and services will improve Outcome: Progressing Goal: Ability to manage health-related needs will improve Outcome: Progressing   Problem: Metabolic: Goal: Ability to maintain appropriate glucose levels will improve Outcome: Progressing   Problem: Skin Integrity: Goal: Risk for impaired skin integrity will decrease Outcome: Progressing   Problem: Tissue Perfusion: Goal: Adequacy of tissue perfusion will improve Outcome: Progressing

## 2023-11-03 NOTE — Plan of Care (Signed)

## 2023-11-04 ENCOUNTER — Inpatient Hospital Stay

## 2023-11-04 DIAGNOSIS — K7682 Hepatic encephalopathy: Secondary | ICD-10-CM | POA: Diagnosis not present

## 2023-11-04 LAB — BLOOD GAS, ARTERIAL
Acid-Base Excess: 2.9 mmol/L — ABNORMAL HIGH (ref 0.0–2.0)
Bicarbonate: 25.9 mmol/L (ref 20.0–28.0)
O2 Content: 21 L/min
O2 Saturation: 99.5 %
Patient temperature: 37
pCO2 arterial: 34 mmHg (ref 32–48)
pH, Arterial: 7.49 — ABNORMAL HIGH (ref 7.35–7.45)
pO2, Arterial: 100 mmHg (ref 83–108)

## 2023-11-04 LAB — GLUCOSE, CAPILLARY
Glucose-Capillary: 86 mg/dL (ref 70–99)
Glucose-Capillary: 81 mg/dL (ref 70–99)
Glucose-Capillary: 78 mg/dL (ref 70–99)
Glucose-Capillary: 152 mg/dL — ABNORMAL HIGH (ref 70–99)
Glucose-Capillary: 130 mg/dL — ABNORMAL HIGH (ref 70–99)

## 2023-11-04 LAB — AMMONIA: Ammonia: 134 umol/L — ABNORMAL HIGH (ref 9–35)

## 2023-11-04 NOTE — Care Management Important Message (Signed)
 Important Message  Patient Details  Name: [name] MRN: [id number] Date of Birth: 11-Apr-1951   Important Message Given:  Yes - Medicare IM     Cristela Blue, CMA 11/04/2023, 12:42 PM

## 2023-11-04 NOTE — Progress Notes (Signed)
 Patient noted to be obtunded on assessment. Dr Georgeann Oppenheim on the floor and requested to come to room. Pt's pupils reactive. Pt only responsive to pain at this time. Dr Georgeann Oppenheim ordered CT and NG for lactulose. See new orders.

## 2023-11-04 NOTE — Progress Notes (Signed)
 MEWS Progress Note  Patient Details Name: [name] MRN: [id number] DOB: 1950/10/13 Today's Date: 11/04/2023   MEWS Flowsheet Documentation:  Assess: MEWS Score Temp: 97.6 F (36.4 C) BP: (!) 148/30 MAP (mmHg): (!) 63 Pulse Rate: (!) 44 ECG Heart Rate: (!) 47 Resp: 16 Level of Consciousness: Responds to Pain SpO2: 100 % O2 Device: Room Air Patient Activity (if Appropriate): In bed Assess: MEWS Score MEWS Temp: 0 MEWS Systolic: 0 MEWS Pulse: 1 MEWS RR: 0 MEWS LOC: 2 MEWS Score: 3 MEWS Score Color: Yellow Assess: SIRS CRITERIA SIRS Temperature : 0 SIRS Respirations : 0 SIRS Pulse: 0 SIRS WBC: 0 SIRS Score Sum : 0 SIRS Temperature : 0 SIRS Pulse: 0 SIRS Respirations : 0 SIRS WBC: 0 SIRS Score Sum : 0 Assess: if the MEWS score is Yellow or Red Were vital signs accurate and taken at a resting state?: (P) Yes Does the patient meet 2 or more of the SIRS criteria?: (P) No MEWS guidelines implemented : (P) Yes, yellow Treat MEWS Interventions: (P) Considered administering scheduled or prn medications/treatments as ordered Take Vital Signs Increase Vital Sign Frequency : (P) Yellow: Q2hr x1, continue Q4hrs until patient remains green for 12hrs Escalate MEWS: Escalate: (P) Yellow: Discuss with charge nurse and consider notifying provider and/or RRT Notify: Charge Nurse/RN Name of Charge Nurse/RN Notified: Demetrius Charity) Marchelle Folks RN Provider Notification Provider Name/Title: Demetrius Charity) Sreenath Date Provider Notified: (P) 11/04/23 Time Provider Notified: (P) 1225 Method of Notification: (P) Face-to-face Notification Reason: (P) Change in status Provider response: (P) At bedside Date of Provider Response: (P) 11/04/23 Time of Provider Response: (P) 1225   See new orders. NGT and CT scan ordered.    Adair Laundry 11/04/2023, 12:40 PM

## 2023-11-04 NOTE — Progress Notes (Signed)
 OT Cancellation Note  Patient Details Name: KARL ERWAY MRN: [id number] DOB: 05-22-1951   Cancelled Treatment:    Reason Eval/Treat Not Completed: Patient's level of consciousness. Per nursing, pt obtunded this morning. Currently getting additional imaging/work up. Will hold OT today and re-attempt at later date/time as appropriate.   Arman Filter., MPH, MS, OTR/L ascom [phone] 11/04/23, 12:21 PM

## 2023-11-04 NOTE — Progress Notes (Signed)
 PROGRESS NOTE    [name]  XBJ:[id number] DOB: [birth year] DOA: 10/31/2023 PCP: [facility]    Brief Narrative:   73 y.o. male with medical history significant of hepatitis C, liver cirrhosis, hepatic encephalopathy recurrent, TIA, chronic HFpEF, CAD, COPD, IIDM, HTN, HLD, sent from group home for evaluation of altered mentations.   Patient is confused and unable to provide any history, all history provided by ED physician and review of patient chart.  Patient was found to be confused this morning and sent to ED.  Unclear whether patient has been compliant with his lactulose.  ED workup found significant increase of ammonium level 2.43, blood work showed persistent pancytopenia WBC 2.4, hemoglobin 12.0 compared to baseline 10-11, platelet 83.  CT head reassuring.  NGT inserted on admission.  Patient mental status has improved slightly to the point that he can eat and swallow by mouth with limited risk of aspiration.  NGT will be discontinued 3/7.  3/10: Patient much more obtunded today.  Responsive only to noxious stimulus.  CT head and ABG reassuring.  Patient has been not adherent with lactulose, refusing doses in hospital.  I suspect this is breakthrough hepatic encephalopathy secondary to nonadherence with lactulose therapy.  Assessment & Plan:   Principal Problem:   Hepatic encephalopathy (HCC) Active Problems:   Acute hepatic encephalopathy (HCC)   Acute hepatic encephalopathy, recurrent -Suspect noncoherent/noncompliant with lactulose -Patient was improving clinically however had been refusing doses of lactulose on 3/9 so had some breakthrough hepatic encephalopathy on 3/10.  ABG and CT head normal.  Ammonia markedly elevated at 134. Plan: Replace NGT.  Continue lactulose 30 g 3 times daily via tube.  Titrate to mental status in 3-4 soft bowel movements daily.  Avoid diarrhea.  Pancytopenia -Secondary to cirrhosis -H&H stable, no signs of active bleeding,  no signs of active infection.   Acute dysphagia -Secondary to acute encephalopathy -Hold oral intake until improvement in mental status   HTN -Lasix, Cozaar, Aldactone   Chronic HFpEF -Euvolemic, no acute concerns -Continue Lasix and spironolactone   COPD -No acute concerns   Major depression disorder -Continue Celexa hydroxyzine as needed and Seroquel as needed - Mental status has been appropriate    DVT prophylaxis: SCD Code Status: DNR Family Communication: None Disposition Plan: Status is: Inpatient Remains inpatient appropriate because: Hepatic encephalopathy, recurrent   Level of care: Med-Surg  Consultants:  None  Procedures:  None  Antimicrobials: None   Subjective: Seen and examined.  Obtunded.  Responsive only to noxious stimulus.  Does not follow commands or answer questions.  Objective: Vitals:   11/04/23 0756 11/04/23 0951 11/04/23 1005 11/04/23 1223  BP: (!) 154/69 (!) 158/67 (!) 147/61 (!) 148/30  Pulse: (!) 44 (!) 44 (!) 43 (!) 44  Resp: 18 14  16   Temp: 98 F (36.7 C) 97.8 F (36.6 C)  97.6 F (36.4 C)  TempSrc:      SpO2: 100% 100% 100% 100%   No intake or output data in the 24 hours ending 11/04/23 1417  There were no vitals filed for this visit.  Examination:  General exam: No acute distress.  Attempted Respiratory system: Lungs clear, normal WOB, RA Cardiovascular system: S1-S2, RRR, no murmurs, no pedal edema Gastrointestinal system: Mild distention, nontender Central nervous system: Oriented x 0.  Lethargic.  Unable to assess Extremities: Symmetric 5 x 5 power. Skin: No rashes, lesions or ulcers Psychiatry: Unable to assess    Data Reviewed: I have personally reviewed  following labs and imaging studies  CBC: Recent Labs  Lab 10/31/23 0955 11/02/23 0822  WBC 2.4* 2.7*  NEUTROABS 1.0* 1.2*  HGB 12.0* 10.1*  HCT 36.7* 29.4*  MCV 94.3 92.7  PLT 83* 57*   Basic Metabolic Panel: Recent Labs  Lab 10/30/23 1220  10/31/23 0955 11/02/23 0822  NA 143 143 140  K 3.6 4.1 3.8  CL 112* 112* 111  CO2 25 24 25   GLUCOSE 63* 93 82  BUN 21 18 18   CREATININE 1.30* 1.26* 0.99  CALCIUM 10.0 10.1 9.7  MG 1.8  --   --    GFR: Estimated Creatinine Clearance: 65.5 mL/min (by C-G formula based on SCr of 0.99 mg/dL). Liver Function Tests: Recent Labs  Lab 10/31/23 0955  AST 42*  ALT 31  ALKPHOS 65  BILITOT 1.3*  PROT 6.6  ALBUMIN 2.7*   No results for input(s): "LIPASE", "AMYLASE" in the last 168 hours. Recent Labs  Lab 10/31/23 1010 11/01/23 0523 11/04/23 1051  AMMONIA 158* 55* 134*   Coagulation Profile: No results for input(s): "INR", "PROTIME" in the last 168 hours. Cardiac Enzymes: No results for input(s): "CKTOTAL", "CKMB", "CKMBINDEX", "TROPONINI" in the last 168 hours. BNP (last 3 results) No results for input(s): "PROBNP" in the last 8760 hours. HbA1C: No results for input(s): "HGBA1C" in the last 72 hours. CBG: Recent Labs  Lab 11/03/23 1703 11/03/23 2117 11/04/23 0754 11/04/23 0953 11/04/23 1200  GLUCAP 160* 108* 86 81 78   Lipid Profile: No results for input(s): "CHOL", "HDL", "LDLCALC", "TRIG", "CHOLHDL", "LDLDIRECT" in the last 72 hours. Thyroid Function Tests: No results for input(s): "TSH", "T4TOTAL", "FREET4", "T3FREE", "THYROIDAB" in the last 72 hours. Anemia Panel: No results for input(s): "VITAMINB12", "FOLATE", "FERRITIN", "TIBC", "IRON", "RETICCTPCT" in the last 72 hours. Sepsis Labs: Recent Labs  Lab 10/31/23 0954 10/31/23 1501  LATICACIDVEN 1.7 1.9    Recent Results (from the past 240 hours)  Culture, blood (routine x 2)     Status: None (Preliminary result)   Collection Time: 10/31/23  9:54 AM   Specimen: BLOOD  Result Value Ref Range Status   Specimen Description BLOOD BLOOD LEFT ARM  Final   Special Requests   Final    BOTTLES DRAWN AEROBIC ONLY Blood Culture results may not be optimal due to an inadequate volume of blood received in culture  bottles   Culture   Final    NO GROWTH 4 DAYS Performed at [facility], [address] [place], [place] [address]    Report Status PENDING  Incomplete  Culture, blood (routine x 2)     Status: None (Preliminary result)   Collection Time: 10/31/23  9:54 AM   Specimen: BLOOD  Result Value Ref Range Status   Specimen Description BLOOD BLOOD RIGHT ARM  Final   Special Requests   Final    BOTTLES DRAWN AEROBIC AND ANAEROBIC Blood Culture results may not be optimal due to an inadequate volume of blood received in culture bottles   Culture   Final    NO GROWTH 4 DAYS Performed at [facility], [address] [place], [place] [address]    Report Status PENDING  Incomplete  Resp panel by RT-PCR (RSV, Flu A&B, Covid) Anterior Nasal Swab     Status: None   Collection Time: 10/31/23 10:10 AM   Specimen: Anterior Nasal Swab  Result Value Ref Range Status   SARS Coronavirus 2 by RT PCR NEGATIVE NEGATIVE Final    Comment: (NOTE) SARS-CoV-2  target nucleic acids are NOT DETECTED.  The SARS-CoV-2 RNA is generally detectable in upper respiratory specimens during the acute phase of infection. The lowest concentration of SARS-CoV-2 viral copies this assay can detect is 138 copies/mL. A negative result does not preclude SARS-Cov-2 infection and should not be used as the sole basis for treatment or other patient management decisions. A negative result may occur with  improper specimen collection/handling, submission of specimen other than nasopharyngeal swab, presence of viral mutation(s) within the areas targeted by this assay, and inadequate number of viral copies(<138 copies/mL). A negative result must be combined with clinical observations, patient history, and epidemiological information. The expected result is Negative.  Fact Sheet for Patients:  BloggerCourse.com  Fact Sheet for Healthcare Providers:   SeriousBroker.it  This test is no t yet approved or cleared by the [place] FDA and  has been authorized for detection and/or diagnosis of SARS-CoV-2 by FDA under an Emergency Use Authorization (EUA). This EUA will remain  in effect (meaning this test can be used) for the duration of the COVID-19 declaration under Section 564(b)(1) of the Act, 21 U.S.C.section 360bbb-3(b)(1), unless the authorization is terminated  or revoked sooner.       Influenza A by PCR NEGATIVE NEGATIVE Final   Influenza B by PCR NEGATIVE NEGATIVE Final    Comment: (NOTE) The Xpert Xpress SARS-CoV-2/FLU/RSV plus assay is intended as an aid in the diagnosis of influenza from Nasopharyngeal swab specimens and should not be used as a sole basis for treatment. Nasal washings and aspirates are unacceptable for Xpert Xpress SARS-CoV-2/FLU/RSV testing.  Fact Sheet for Patients: BloggerCourse.com  Fact Sheet for Healthcare Providers: SeriousBroker.it  This test is not yet approved or cleared by the [place] FDA and has been authorized for detection and/or diagnosis of SARS-CoV-2 by FDA under an Emergency Use Authorization (EUA). This EUA will remain in effect (meaning this test can be used) for the duration of the COVID-19 declaration under Section 564(b)(1) of the Act, 21 U.S.C. section 360bbb-3(b)(1), unless the authorization is terminated or revoked.     Resp Syncytial Virus by PCR NEGATIVE NEGATIVE Final    Comment: (NOTE) Fact Sheet for Patients: BloggerCourse.com  Fact Sheet for Healthcare Providers: SeriousBroker.it  This test is not yet approved or cleared by the [place] FDA and has been authorized for detection and/or diagnosis of SARS-CoV-2 by FDA under an Emergency Use Authorization (EUA). This EUA will remain in effect (meaning this test can be used) for  the duration of the COVID-19 declaration under Section 564(b)(1) of the Act, 21 U.S.C. section 360bbb-3(b)(1), unless the authorization is terminated or revoked.  Performed at [facility], [address] [place], [place] [address]          Radiology Studies: CT HEAD WO CONTRAST ( ) Result Date: 11/04/2023 CLINICAL DATA:  Mental status change of unknown cause. Hepatic encephalopathy. EXAM: CT HEAD WITHOUT CONTRAST TECHNIQUE: Contiguous axial images were obtained from the base of the skull through the vertex without intravenous contrast. RADIATION DOSE REDUCTION: This exam was performed according to the departmental dose-optimization program which includes automated exposure control, adjustment of the mA and/or kV according to patient size and/or use of iterative reconstruction technique. COMPARISON:  10/31/2023 FINDINGS: Brain: Age related volume loss. No evidence of old or acute focal infarction, mass lesion, hemorrhage, hydrocephalus or extra-axial collection. Vascular: There is atherosclerotic calcification of the major vessels at the base of the brain. Skull: Negative Sinuses/Orbits: Previous functional endoscopic sinus surgery. No active  inflammation. Orbits negative. Other: None IMPRESSION: No acute CT finding. Age related volume loss. Atherosclerotic calcification of the major vessels at the base of the brain. Electronically Signed   By: Paulina Fusi M.D.   On: 11/04/2023 12:22         Scheduled Meds:  atorvastatin  40 mg Oral Daily   citalopram  20 mg Oral Daily   feeding supplement  237 mL Oral TID BM   furosemide  20 mg Oral Daily   gabapentin  300 mg Oral BID   insulin aspart  0-9 Units Subcutaneous TID WC   lactulose  30 g Oral TID   losartan  50 mg Oral Daily   pantoprazole  40 mg Oral Daily   rifaximin  550 mg Oral BID   spironolactone  25 mg Oral Daily   Continuous Infusions:   LOS: 3 days    Tresa Moore, MD [facility]   If  7PM-7AM, please contact night-coverage  11/04/2023, 2:17 PM

## 2023-11-04 NOTE — Progress Notes (Signed)
 SLP Cancellation Note  Patient Details Name: [name] MRN: [id number] DOB: [birth year]   Cancelled treatment:       Reason Eval/Treat Not Completed: Fatigue/lethargy limiting ability to participate;Patient's level of consciousness;Patient not medically ready (chart reviewed; consulted NSG re: pt's status today)  Per MD note today, "Patient much more obtunded today. Responsive only to noxious stimulus. CT head and ABG reassuring. Patient has been not adherent with lactulose, refusing doses in hospital. I suspect this is breakthrough hepatic encephalopathy secondary to nonadherence with lactulose therapy.".  Recommend Holding all po's until pt is fully alert/awake to safely participate in po intake. ST services will hold on tx session today and f/u tomorrow. NSG endorsed carefully monitoring pt's alertness prior to any po's; pt has an NGT for lactulose, meds at this time.      [name], MS, CCC-SLP Speech Language Pathologist Rehab Services; [facility] [phone] (ascom) Aubriee Szeto 11/04/2023, 3:01 PM

## 2023-11-05 DIAGNOSIS — K7682 Hepatic encephalopathy: Secondary | ICD-10-CM | POA: Diagnosis not present

## 2023-11-05 LAB — GLUCOSE, CAPILLARY
Glucose-Capillary: 89 mg/dL (ref 70–99)
Glucose-Capillary: 68 mg/dL — ABNORMAL LOW (ref 70–99)
Glucose-Capillary: 80 mg/dL (ref 70–99)
Glucose-Capillary: 87 mg/dL (ref 70–99)
Glucose-Capillary: 98 mg/dL (ref 70–99)

## 2023-11-05 LAB — CULTURE, BLOOD (ROUTINE X 2)
Culture: NO GROWTH
Culture: NO GROWTH

## 2023-11-05 NOTE — Progress Notes (Signed)
 Occupational Therapy Treatment Patient Details Name: [name] MRN: [id number] DOB: 06-Mar-1951 Today's Date: 11/05/2023   History of present illness Pt is a 73 y.o. male presenting to hospital 10/31/23 with AMS and decreased responsiveness.  Pt admitted with acute hepatic encephalopathy, pancytopenia, acute dysphagia.  PMH includes asthma, CHF, COPD, DM, htn, sleep apnea, cardiac cath, eye sx, inner ear sx, nose sx, hepatitis C, liver cirrhosis, recurrent hepatic encephalopathy, TIA, PTSD, ACDF 2024.   OT comments  Pt seen for OT treatment on this date. Upon arrival to room pt resting in bed, easily awakes to voice. Pt willing to participate in tx session. Pt was A/O x2, stated hospital name and his name. Pt amb to BR with RW+CGA, frequent verbal/tactile cues for sequencing and attention to task. Pt requires step by step verbal instruction to attend task. Slightly impulsive today, not wanting to wait for donning gate belt or RW, often forgets to hold on to walker and what task we are attending. Pt demonstrates improved functional mobility on this date but difficulty sequencing and attending to tasks. Pt making good progress toward goals, will continue to follow POC. Discharge recommendation remains appropriate.        If plan is discharge home, recommend the following:  Assistance with feeding;Assist for transportation;Direct supervision/assist for medications management;Help with stairs or ramp for entrance;Supervision due to cognitive status;Direct supervision/assist for financial management;Assistance with cooking/housework;A lot of help with bathing/dressing/bathroom   Equipment Recommendations  Other (comment)    Recommendations for Other Services      Precautions / Restrictions Precautions Precautions: Fall Recall of Precautions/Restrictions: Impaired Restrictions Weight Bearing Restrictions Per Provider Order: No       Mobility Bed Mobility Overal bed mobility: Needs  Assistance Bed Mobility: Supine to Sit, Sit to Supine     Supine to sit: Modified independent (Device/Increase time) Sit to supine: Modified independent (Device/Increase time)   General bed mobility comments: Pt hopped into bed knees first impulsivly, then required multimodal cues to get to supine.    Transfers Overall transfer level: Needs assistance Equipment used: Rolling walker (2 wheels) Transfers: Sit to/from Stand Sit to Stand: Contact guard assist           General transfer comment: Frequent cues to remind pt we are getting out of bed, pt kept forgetting what he was trying to do. No physical assistance required.     Balance Overall balance assessment: Needs assistance Sitting-balance support: Bilateral upper extremity supported, Feet supported Sitting balance-Leahy Scale: Fair     Standing balance support: Single extremity supported, During functional activity Standing balance-Leahy Scale: Fair                             ADL either performed or assessed with clinical judgement   ADL Overall ADL's : Needs assistance/impaired Eating/Feeding: Supervision/ safety   Grooming: Wash/dry face;Wash/dry hands;Sitting;Minimal assistance;Cueing for sequencing                   Toilet Transfer: Contact guard assist;Cueing for safety;Cueing for sequencing;Rolling walker (2 wheels);Regular Toilet   Toileting- Clothing Manipulation and Hygiene: Maximal assistance;Cueing for sequencing       Functional mobility during ADLs: Contact guard assist;Rolling walker (2 wheels);Cueing for sequencing;Cueing for safety General ADL Comments: Pt amb to BR with RW+CGA, frequent verbal/tactile cues for sequencing and attention to task. Pt requires step by step verbal instruction to attend task. Slightly impulsive today, not wanting to wait for  donning gate belt or RW, often forgets to hold on to walker.    Communication Communication Communication: Impaired Factors  Affecting Communication: Difficulty expressing self   Cognition Arousal: Alert Behavior During Therapy: WFL for tasks assessed/performed Cognition: Cognition impaired, History of cognitive impairments   Orientation impairments: Time, Situation, Place   Memory impairment (select all impairments): Short-term memory Attention impairment (select first level of impairment): Focused attention Executive functioning impairment (select all impairments): Initiation, Sequencing, Reasoning, Problem solving, Organization OT - Cognition Comments: Oriented to name only                 Following commands: Impaired Following commands impaired: Follows one step commands inconsistently      Cueing   Cueing Techniques: Verbal cues, Gestural cues, Tactile cues, Visual cues  Exercises Exercises: Other exercises Other Exercises Other Exercises: Edu: DME management    Shoulder Instructions       General Comments Pt often impuslive on this date, required lots of verbal cues throughout tx. Easily redirected.    Pertinent Vitals/ Pain       Pain Assessment Pain Assessment: Faces Faces Pain Scale: No hurt  Home Living                                          Prior Functioning/Environment              Frequency  Min 1X/week        Progress Toward Goals  OT Goals(current goals can now be found in the care plan section)  Progress towards OT goals: Progressing toward goals  Acute Rehab OT Goals Patient Stated Goal: to leave hospital OT Goal Formulation: With patient Time For Goal Achievement: 11/08/23 Potential to Achieve Goals: Fair  Plan      Co-evaluation                 AM-PAC OT "6 Clicks" Daily Activity     Outcome Measure   Help from another person eating meals?: A Lot Help from another person taking care of personal grooming?: A Lot Help from another person toileting, which includes using toliet, bedpan, or urinal?: A Little Help from  another person bathing (including washing, rinsing, drying)?: A Lot Help from another person to put on and taking off regular upper body clothing?: A Little Help from another person to put on and taking off regular lower body clothing?: A Lot 6 Click Score: 14    End of Session Equipment Utilized During Treatment: Gait belt;Rolling walker (2 wheels)  OT Visit Diagnosis: Unsteadiness on feet (R26.81);Other abnormalities of gait and mobility (R26.89);Muscle weakness (generalized) (M62.81);Other symptoms and signs involving cognitive function   Activity Tolerance Patient tolerated treatment well   Patient Left in bed;with call bell/phone within reach;with bed alarm set   Nurse Communication Mobility status        Time: 1610-9604 OT Time Calculation (min): 18 min  Charges: OT General Charges $OT Visit: 1 Visit OT Treatments $Self Care/Home Management : 8-22 mins  Glenard Haring M.S. OTR/L  11/05/23, 3:50 PM

## 2023-11-05 NOTE — Progress Notes (Signed)
 Nasogastric tube removed as order.pt tolerated well.

## 2023-11-05 NOTE — Progress Notes (Signed)
 Physical Therapy Treatment Patient Details Name: [name] MRN: [id number] DOB: Aug 15, 1951 Today's Date: 11/05/2023   History of Present Illness Pt is a 73 y.o. male presenting to hospital 10/31/23 with AMS and decreased responsiveness.  Pt admitted with acute hepatic encephalopathy, pancytopenia, acute dysphagia.  PMH includes asthma, CHF, COPD, DM, htn, sleep apnea, cardiac cath, eye sx, inner ear sx, nose sx, hepatitis C, liver cirrhosis, recurrent hepatic encephalopathy, TIA, PTSD, ACDF 2024.    PT Comments  Pt resting in bed upon PT arrival; pt agreeable to therapy.  No c/o pain during session.  Currently pt is SBA semi-supine to sitting EOB; CGA with transfer from bed using RW; and CGA to ambulate 200 feet with RW use.  Pt steady ambulating with RW use although pt tending to veer to R and bump into objects on R with walker initially; after pt given vc's to stay in middle of hallway while walking pt demonstrated significant improvement with walking in fairly straight path.  PT recommendations updated--MD and TOC notified.   If plan is discharge home, recommend the following: A little help with walking and/or transfers;A little help with bathing/dressing/bathroom;Assistance with cooking/housework;Assist for transportation;Help with stairs or ramp for entrance   Can travel by private vehicle     Yes  Equipment Recommendations  Rolling walker (2 wheels)    Recommendations for Other Services       Precautions / Restrictions Precautions Precautions: Fall Recall of Precautions/Restrictions: Impaired Restrictions Weight Bearing Restrictions Per Provider Order: No     Mobility  Bed Mobility Overal bed mobility: Needs Assistance Bed Mobility: Supine to Sit     Supine to sit: Supervision     General bed mobility comments: SBA for safety but no physical assist required    Transfers Overall transfer level: Needs assistance Equipment used: Rolling walker (2 wheels) Transfers:  Sit to/from Stand Sit to Stand: Contact guard assist           General transfer comment: CGA for safety; steady with RW use    Ambulation/Gait Ambulation/Gait assistance: Contact guard assist Gait Distance (Feet): 200 Feet Assistive device: Rolling walker (2 wheels) Gait Pattern/deviations: Step-through pattern, Decreased step length - right, Decreased step length - left Gait velocity: decreased     General Gait Details: steady ambulation with RW use; pt tending to veer to R and bump into objects on R with walker but after pt given vc's to stay in middle of hallway pt demonstrated significant improvement with walking in fairly straight path   Stairs             Wheelchair Mobility     Tilt Bed    Modified Rankin (Stroke Patients Only)       Balance Overall balance assessment: Needs assistance Sitting-balance support: Bilateral upper extremity supported, Feet supported Sitting balance-Leahy Scale: Good Sitting balance - Comments: steady reaching within BOS   Standing balance support: Bilateral upper extremity supported, During functional activity, Reliant on assistive device for balance Standing balance-Leahy Scale: Good Standing balance comment: steady ambulating with RW use and management of walker (even when bumping into objects with RW)                            Communication Communication Communication: Impaired Factors Affecting Communication: Difficulty expressing self  Cognition Arousal: Alert Behavior During Therapy: WFL for tasks assessed/performed   PT - Cognitive impairments: History of cognitive impairments  PT - Cognition Comments: Pt oriented to name and hospital Following commands: Impaired Following commands impaired: Follows one step commands inconsistently    Cueing Cueing Techniques: Verbal cues, Gestural cues, Tactile cues, Visual cues  Exercises      General Comments General comments  (skin integrity, edema, etc.): Impulsiveness noted but easily redirected.      Pertinent Vitals/Pain Pain Assessment Pain Assessment: Faces Faces Pain Scale: No hurt Pain Intervention(s): Limited activity within patient's tolerance, Monitored during session Vitals (HR and SpO2 on room air) stable and WFL throughout treatment session.    Home Living                          Prior Function            PT Goals (current goals can now be found in the care plan section) Acute Rehab PT Goals Patient Stated Goal: To improve walking PT Goal Formulation: With patient Time For Goal Achievement: 11/15/23 Potential to Achieve Goals: Good Progress towards PT goals: Progressing toward goals    Frequency    Min 2X/week      PT Plan      Co-evaluation              AM-PAC PT "6 Clicks" Mobility   Outcome Measure  Help needed turning from your back to your side while in a flat bed without using bedrails?: None Help needed moving from lying on your back to sitting on the side of a flat bed without using bedrails?: A Little Help needed moving to and from a bed to a chair (including a wheelchair)?: A Little Help needed standing up from a chair using your arms (e.g., wheelchair or bedside chair)?: A Little Help needed to walk in hospital room?: A Little Help needed climbing 3-5 steps with a railing? : A Little 6 Click Score: 19    End of Session Equipment Utilized During Treatment: Gait belt Activity Tolerance: Patient tolerated treatment well Patient left: in chair;with call bell/phone within reach;with chair alarm set Nurse Communication: Mobility status;Precautions PT Visit Diagnosis: Other abnormalities of gait and mobility (R26.89);Muscle weakness (generalized) (M62.81);History of falling (Z91.81);Pain     Time: 1346-1410 PT Time Calculation (min) (ACUTE ONLY): 24 min  Charges:    $Gait Training: 8-22 mins $Therapeutic Activity: 8-22 mins PT General  Charges $$ ACUTE PT VISIT: 1 Visit                     Hendricks Limes, PT 11/05/23, 4:24 PM

## 2023-11-05 NOTE — Progress Notes (Signed)
 PROGRESS NOTE    [name]  WUJ:[id number] DOB: 1951-07-25 DOA: 10/31/2023 PCP: [facility]    Brief Narrative:   73 y.o. male with medical history significant of hepatitis C, liver cirrhosis, hepatic encephalopathy recurrent, TIA, chronic HFpEF, CAD, COPD, IIDM, HTN, HLD, sent from group home for evaluation of altered mentations.   Patient is confused and unable to provide any history, all history provided by ED physician and review of patient chart.  Patient was found to be confused this morning and sent to ED.  Unclear whether patient has been compliant with his lactulose.  ED workup found significant increase of ammonium level 2.43, blood work showed persistent pancytopenia WBC 2.4, hemoglobin 12.0 compared to baseline 10-11, platelet 83.  CT head reassuring.  NGT inserted on admission.  Patient mental status has improved slightly to the point that he can eat and swallow by mouth with limited risk of aspiration.  NGT will be discontinued 3/7.  3/10: Patient much more obtunded today.  Responsive only to noxious stimulus.  CT head and ABG reassuring.  Patient has been not adherent with lactulose, refusing doses in hospital.  I suspect this is breakthrough hepatic encephalopathy secondary to nonadherence with lactulose therapy.  3/11: NGT placed yesterday.  Lactulose administered.  Mental status much improved today  Assessment & Plan:   Principal Problem:   Hepatic encephalopathy (HCC) Active Problems:   Acute hepatic encephalopathy (HCC)   Acute hepatic encephalopathy, recurrent -Suspect noncoherent/noncompliant with lactulose -Patient was improving clinically however had been refusing doses of lactulose on 3/9 so had some breakthrough hepatic encephalopathy on 3/10.  ABG and CT head normal.  Ammonia markedly elevated at 134.  NGT placed 3/10.  Lactulose administered for several doses.  Mental status improved. Plan: DC NGT.  Continue lactulose 30 g 3 times daily  p.o.  Titrate to 3-4 soft bowel movements daily.  Titrate to improvement in mental status.  Avoid diarrhea.  Advance hepatic cirrhosis Patient is extremely brittle cirrhotic.  Mental status declines significantly with even 1 day of missed lactulose.  Overall prognosis is poor.  Patient is a DNR.  May be an appropriate candidate for further de-escalation and consideration of hospice services.  Pancytopenia -Secondary to cirrhosis -H&H stable, no signs of active bleeding, no signs of active infection.   Acute dysphagia -Secondary to acute encephalopathy -Improved.  Patient okay for p.o. intake   HTN -Lasix, Cozaar, Aldactone   Chronic HFpEF -Euvolemic, no acute concerns -Continue Lasix and spironolactone   COPD -No acute concerns   Major depression disorder -Continue Celexa hydroxyzine as needed and Seroquel as needed - Mental status has been appropriate    DVT prophylaxis: SCD Code Status: DNR Family Communication: None Disposition Plan: Status is: Inpatient Remains inpatient appropriate because: Hepatic encephalopathy, recurrent   Level of care: Med-Surg  Consultants:  None  Procedures:  None  Antimicrobials: None   Subjective: Seen and examined.  Lethargic but mental status much improved.  Answers questions appropriately.  Follows commands.  Objective: Vitals:   11/05/23 0045 11/05/23 0430 11/05/23 0751 11/05/23 0800  BP: 127/60 125/63 (!) 140/63   Pulse: 62 (!) 47 (!) 48 60  Resp: 18 18 15    Temp: 98.1 F (36.7 C) (!) 97.2 F (36.2 C) 97.9 F (36.6 C)   TempSrc:      SpO2: 100% 100% 100%     Intake/Output Summary (Last 24 hours) at 11/05/2023 1332 Last data filed at 11/05/2023 0440 Gross per 24 hour  Intake --  Output 254 ml  Net -254 ml    There were no vitals filed for this visit.  Examination:  General exam: No acute distress.  Appears fatigued Respiratory system: Lungs clear, normal WOB, RA Cardiovascular system: S1-S2, RRR, no  murmurs, no pedal edema Gastrointestinal system: Mild distention, nontender Central nervous system: Alert.  Oriented x 3. Extremities: Symmetric 5 x 5 power. Skin: No rashes, lesions or ulcers Psychiatry: Mood and affect flattened    Data Reviewed: I have personally reviewed following labs and imaging studies  CBC: Recent Labs  Lab 10/31/23 0955 11/02/23 0822  WBC 2.4* 2.7*  NEUTROABS 1.0* 1.2*  HGB 12.0* 10.1*  HCT 36.7* 29.4*  MCV 94.3 92.7  PLT 83* 57*   Basic Metabolic Panel: Recent Labs  Lab 10/30/23 1220 10/31/23 0955 11/02/23 0822  NA 143 143 140  K 3.6 4.1 3.8  CL 112* 112* 111  CO2 25 24 25   GLUCOSE 63* 93 82  BUN 21 18 18   CREATININE 1.30* 1.26* 0.99  CALCIUM 10.0 10.1 9.7  MG 1.8  --   --    GFR: Estimated Creatinine Clearance: 65.5 mL/min (by C-G formula based on SCr of 0.99 mg/dL). Liver Function Tests: Recent Labs  Lab 10/31/23 0955  AST 42*  ALT 31  ALKPHOS 65  BILITOT 1.3*  PROT 6.6  ALBUMIN 2.7*   No results for input(s): "LIPASE", "AMYLASE" in the last 168 hours. Recent Labs  Lab 10/31/23 1010 11/01/23 0523 11/04/23 1051  AMMONIA 158* 55* 134*   Coagulation Profile: No results for input(s): "INR", "PROTIME" in the last 168 hours. Cardiac Enzymes: No results for input(s): "CKTOTAL", "CKMB", "CKMBINDEX", "TROPONINI" in the last 168 hours. BNP (last 3 results) No results for input(s): "PROBNP" in the last 8760 hours. HbA1C: No results for input(s): "HGBA1C" in the last 72 hours. CBG: Recent Labs  Lab 11/04/23 2030 11/05/23 0426 11/05/23 0752 11/05/23 0812 11/05/23 1152  GLUCAP 130* 89 68* 80 87   Lipid Profile: No results for input(s): "CHOL", "HDL", "LDLCALC", "TRIG", "CHOLHDL", "LDLDIRECT" in the last 72 hours. Thyroid Function Tests: No results for input(s): "TSH", "T4TOTAL", "FREET4", "T3FREE", "THYROIDAB" in the last 72 hours. Anemia Panel: No results for input(s): "VITAMINB12", "FOLATE", "FERRITIN", "TIBC",  "IRON", "RETICCTPCT" in the last 72 hours. Sepsis Labs: Recent Labs  Lab 10/31/23 0954 10/31/23 1501  LATICACIDVEN 1.7 1.9    Recent Results (from the past 240 hours)  Culture, blood (routine x 2)     Status: None   Collection Time: 10/31/23  9:54 AM   Specimen: BLOOD  Result Value Ref Range Status   Specimen Description BLOOD BLOOD LEFT ARM  Final   Special Requests   Final    BOTTLES DRAWN AEROBIC ONLY Blood Culture results may not be optimal due to an inadequate volume of blood received in culture bottles   Culture   Final    NO GROWTH 5 DAYS Performed at [facility], [address] [place], [place] [address]    Report Status 11/05/2023 FINAL  Final  Culture, blood (routine x 2)     Status: None   Collection Time: 10/31/23  9:54 AM   Specimen: BLOOD  Result Value Ref Range Status   Specimen Description BLOOD BLOOD RIGHT ARM  Final   Special Requests   Final    BOTTLES DRAWN AEROBIC AND ANAEROBIC Blood Culture results may not be optimal due to an inadequate volume of blood received in culture bottles   Culture  Final    NO GROWTH 5 DAYS Performed at [facility], [address] [place], [place] [address]    Report Status 11/05/2023 FINAL  Final  Resp panel by RT-PCR (RSV, Flu A&B, Covid) Anterior Nasal Swab     Status: None   Collection Time: 10/31/23 10:10 AM   Specimen: Anterior Nasal Swab  Result Value Ref Range Status   SARS Coronavirus 2 by RT PCR NEGATIVE NEGATIVE Final    Comment: (NOTE) SARS-CoV-2 target nucleic acids are NOT DETECTED.  The SARS-CoV-2 RNA is generally detectable in upper respiratory specimens during the acute phase of infection. The lowest concentration of SARS-CoV-2 viral copies this assay can detect is 138 copies/mL. A negative result does not preclude SARS-Cov-2 infection and should not be used as the sole basis for treatment or other patient management decisions. A negative result may occur with  improper specimen  collection/handling, submission of specimen other than nasopharyngeal swab, presence of viral mutation(s) within the areas targeted by this assay, and inadequate number of viral copies(<138 copies/mL). A negative result must be combined with clinical observations, patient history, and epidemiological information. The expected result is Negative.  Fact Sheet for Patients:  BloggerCourse.com  Fact Sheet for Healthcare Providers:  SeriousBroker.it  This test is no t yet approved or cleared by the [place] FDA and  has been authorized for detection and/or diagnosis of SARS-CoV-2 by FDA under an Emergency Use Authorization (EUA). This EUA will remain  in effect (meaning this test can be used) for the duration of the COVID-19 declaration under Section 564(b)(1) of the Act, 21 U.S.C.section 360bbb-3(b)(1), unless the authorization is terminated  or revoked sooner.       Influenza A by PCR NEGATIVE NEGATIVE Final   Influenza B by PCR NEGATIVE NEGATIVE Final    Comment: (NOTE) The Xpert Xpress SARS-CoV-2/FLU/RSV plus assay is intended as an aid in the diagnosis of influenza from Nasopharyngeal swab specimens and should not be used as a sole basis for treatment. Nasal washings and aspirates are unacceptable for Xpert Xpress SARS-CoV-2/FLU/RSV testing.  Fact Sheet for Patients: BloggerCourse.com  Fact Sheet for Healthcare Providers: SeriousBroker.it  This test is not yet approved or cleared by the [place] FDA and has been authorized for detection and/or diagnosis of SARS-CoV-2 by FDA under an Emergency Use Authorization (EUA). This EUA will remain in effect (meaning this test can be used) for the duration of the COVID-19 declaration under Section 564(b)(1) of the Act, 21 U.S.C. section 360bbb-3(b)(1), unless the authorization is terminated or revoked.     Resp Syncytial  Virus by PCR NEGATIVE NEGATIVE Final    Comment: (NOTE) Fact Sheet for Patients: BloggerCourse.com  Fact Sheet for Healthcare Providers: SeriousBroker.it  This test is not yet approved or cleared by the [place] FDA and has been authorized for detection and/or diagnosis of SARS-CoV-2 by FDA under an Emergency Use Authorization (EUA). This EUA will remain in effect (meaning this test can be used) for the duration of the COVID-19 declaration under Section 564(b)(1) of the Act, 21 U.S.C. section 360bbb-3(b)(1), unless the authorization is terminated or revoked.  Performed at [facility], [address] [place], [place] [address]          Radiology Studies: DG Abd 1 View Result Date: 11/04/2023 CLINICAL DATA:  [id number] Encounter for imaging study to confirm nasogastric (NG) tube placement [id number] EXAM: ABDOMEN - 1 VIEW COMPARISON:  10/31/2023 FINDINGS: Nasogastric tube tip is in the left upper abdomen likely in  the gastric body region. Left lung is clear. No gross abnormality to the heart and mediastinum. Possible calcifications in the right upper abdomen. IMPRESSION: Nasogastric tube tip is in the stomach. Electronically Signed   By: Richarda Overlie M.D.   On: 11/04/2023 14:30   CT HEAD WO CONTRAST ( ) Result Date: 11/04/2023 CLINICAL DATA:  Mental status change of unknown cause. Hepatic encephalopathy. EXAM: CT HEAD WITHOUT CONTRAST TECHNIQUE: Contiguous axial images were obtained from the base of the skull through the vertex without intravenous contrast. RADIATION DOSE REDUCTION: This exam was performed according to the departmental dose-optimization program which includes automated exposure control, adjustment of the mA and/or kV according to patient size and/or use of iterative reconstruction technique. COMPARISON:  10/31/2023 FINDINGS: Brain: Age related volume loss. No evidence of old or acute focal infarction, mass lesion,  hemorrhage, hydrocephalus or extra-axial collection. Vascular: There is atherosclerotic calcification of the major vessels at the base of the brain. Skull: Negative Sinuses/Orbits: Previous functional endoscopic sinus surgery. No active inflammation. Orbits negative. Other: None IMPRESSION: No acute CT finding. Age related volume loss. Atherosclerotic calcification of the major vessels at the base of the brain. Electronically Signed   By: Paulina Fusi M.D.   On: 11/04/2023 12:22         Scheduled Meds:  atorvastatin  40 mg Oral Daily   citalopram  20 mg Oral Daily   feeding supplement  237 mL Oral TID BM   furosemide  20 mg Oral Daily   gabapentin  300 mg Oral BID   insulin aspart  0-9 Units Subcutaneous TID WC   lactulose  30 g Oral TID   losartan  50 mg Oral Daily   pantoprazole  40 mg Oral Daily   rifaximin  550 mg Oral BID   spironolactone  25 mg Oral Daily   Continuous Infusions:   LOS: 4 days    Tresa Moore, MD [facility]   If 7PM-7AM, please contact night-coverage  11/05/2023, 1:32 PM

## 2023-11-05 NOTE — Plan of Care (Signed)
  Problem: Education: Goal: Ability to describe self-care measures that may prevent or decrease complications (Diabetes Survival Skills Education) will improve Outcome: Progressing Goal: Individualized Educational Video(s) Outcome: Progressing   Problem: Coping: Goal: Ability to adjust to condition or change in health will improve Outcome: Progressing   Problem: Fluid Volume: Goal: Ability to maintain a balanced intake and output will improve Outcome: Progressing   Problem: Metabolic: Goal: Ability to maintain appropriate glucose levels will improve Outcome: Progressing   Problem: Nutritional: Goal: Maintenance of adequate nutrition will improve Outcome: Progressing Goal: Progress toward achieving an optimal weight will improve Outcome: Progressing   Problem: Skin Integrity: Goal: Risk for impaired skin integrity will decrease Outcome: Progressing

## 2023-11-05 NOTE — Progress Notes (Signed)
 Pt is alert and oriented X 2.his blood sugar was 68, got orange juice. Rechecked blood sugar is 80.

## 2023-11-05 NOTE — Plan of Care (Signed)
  Problem: Nutritional: Goal: Maintenance of adequate nutrition will improve Outcome: Progressing   Problem: Skin Integrity: Goal: Risk for impaired skin integrity will decrease Outcome: Progressing   Problem: Education: Goal: Knowledge of General Education information will improve Description: Including pain rating scale, medication(s)/side effects and non-pharmacologic comfort measures Outcome: Progressing   Problem: Clinical Measurements: Goal: Will remain free from infection Outcome: Progressing   Problem: Elimination: Goal: Will not experience complications related to bowel motility Outcome: Progressing Goal: Will not experience complications related to urinary retention Outcome: Progressing   Problem: Pain Managment: Goal: General experience of comfort will improve and/or be controlled Outcome: Progressing

## 2023-11-06 DIAGNOSIS — K7682 Hepatic encephalopathy: Secondary | ICD-10-CM | POA: Diagnosis not present

## 2023-11-06 LAB — CBC
HCT: 29.8 % — ABNORMAL LOW (ref 39.0–52.0)
Hemoglobin: 10.4 g/dL — ABNORMAL LOW (ref 13.0–17.0)
MCH: 31.7 pg (ref 26.0–34.0)
MCHC: 34.9 g/dL (ref 30.0–36.0)
MCV: 90.9 fL (ref 80.0–100.0)
Platelets: 63 10*3/uL — ABNORMAL LOW (ref 150–400)
RBC: 3.28 MIL/uL — ABNORMAL LOW (ref 4.22–5.81)
RDW: 15.3 % (ref 11.5–15.5)
WBC: 3.5 10*3/uL — ABNORMAL LOW (ref 4.0–10.5)
nRBC: 0 % (ref 0.0–0.2)

## 2023-11-06 LAB — BASIC METABOLIC PANEL
Anion gap: 6 (ref 5–15)
BUN: 21 mg/dL (ref 8–23)
CO2: 23 mmol/L (ref 22–32)
Calcium: 9.9 mg/dL (ref 8.9–10.3)
Chloride: 106 mmol/L (ref 98–111)
Creatinine, Ser: 0.94 mg/dL (ref 0.61–1.24)
GFR, Estimated: >60 mL/min (ref >60)
Glucose, Bld: 87 mg/dL (ref 70–99)
Potassium: 4 mmol/L (ref 3.5–5.1)
Sodium: 135 mmol/L (ref 135–145)

## 2023-11-06 LAB — GLUCOSE, CAPILLARY
Glucose-Capillary: 74 mg/dL (ref 70–99)
Glucose-Capillary: 116 mg/dL — ABNORMAL HIGH (ref 70–99)
Glucose-Capillary: 127 mg/dL — ABNORMAL HIGH (ref 70–99)
Glucose-Capillary: 131 mg/dL — ABNORMAL HIGH (ref 70–99)

## 2023-11-06 LAB — TSH: TSH: 1.307 u[IU]/mL (ref 0.350–4.500)

## 2023-11-06 NOTE — Progress Notes (Signed)
 Speech Language Pathology Treatment: Dysphagia  Patient Details Name: [name] MRN: [id number] DOB: 1951/08/13 Today's Date: 11/06/2023 Time: 4098-1191 SLP Time Calculation (min) (ACUTE ONLY): 35 min  Assessment / Plan / Recommendation Clinical Impression  Pt seen for ongoing assessment of swallowing this AM; toleration of diet. He is on a Dysphagia level 2(minced foods) w/ thin liquids(rec'd diet last admit also). Pt awake, verbal and followed basic commands w/ cue; fair+ awareness of situation/events/baseline. Able to answer direct, basic questions re: immediate self/room and indicate need to change his gown d/t spilling something on it. SLP assisted. Suspected Cognitive decline at Baseline per MD. No longer requiring Mitts. Pt stated he was "going to take my medicine every day" -- he had refused his Lactulose medicine in recent days resulting in poor responsiveness and need for it to be given via NGT until he cleared.  On RA, afebrile. WBC low.   Pt appears to present w/ grossly functional oropharyngeal phase swallowing; min oral phase dysphagia in setting of declined Cognitive status and Edentulous status (Both Baseline). No overt pharyngeal phase swallowing deficits noted w/ intake of trials. ANY Cognitive decline can impact overall awareness/timing of swallow and safety during po tasks which increases risk for aspiration, choking.  Pt's risk for aspiration appears able to be reduced when following general aspiration precautions including feeding support and when using a modified diet consistency of Minced/puree foods w/ thin liquids(d/t Edentulous status and Cognitive decline).         Pt encouraged and assisted in positioning himself more upright in bed for po trials -- he stated he thought he was "ok like this" but said he could sit up "more" when shown(decreased insight into risk for aspiration eating/drinking lying reclined). He and the bed were positioned upright for the po trials. Pt  then fed himself few trials of thin liquids via straw(~3-4 ozs of tea) and trials of minced, soft solids moistened well w/ No overt clinical s/s of aspiration noted: no decline in vocal quality, no cough, and no decline in respiratory status during/post trials. O2 sats 97% when checked. Oral phase was grossly adequate for bolus management, mashing/gumming, and oral clearing of the boluses given. Educated on full oral clearing b/t bites, need to "SLOW DOWN" and clear mouth b/t each bite. Pt followed w/ fair-good accuracy.          In setting of Cognitive decline, Edentulous status, and previously recommended oral diet last admit,  recommend continue the dysphagia level 2(Minced w/ added Puree foods) moistened for ease and flavor; thin liquids. General aspiration precautions; reduce Distractions during meals and engage pt during meals for self-feeding. Monitor for Impulsive feeding behaviors and oral clearing b/t bites. Pills Crushed vs whole in Puree for safer swallowing. Support/Supervision at meals; monitoring for needs. MD/NSG updated.  Recommend follow w/ Palliative Care and Dietician for support. Suspect pt is close to/at his baseline. No further Acute ST needs indicated -- f/u at his next venue of care per MD if indicated. Precautions posted in room, chart.      HPI HPI: Pt  is a 73 y.o. male with medical history significant of hepatitis C, liver cirrhosis, hepatic encephalopathy recurrent, TIA, chronic HFpEF, CAD, COPD, IIDM, HTN, HLD, sent from group home for evaluation of altered mentations.  MD suspects Baseline Cognitive decline.   Patient is confused and unable to provide any history, all history provided by ED physician and review of patient chart.  Patient was found to be confused this morning  and sent to ED.  Unclear whether patient has been compliant with his lactulose.  ED workup found significant increase of ammonium level 2.43, blood work showed persistent pancytopenia WBC 2.4, hemoglobin  12.0 compared to baseline 10-11, platelet 83.  NGT inserted and patient received lactulose x 1 in the ED.   CXR: No active disease.  Head CT: No acute intracranial abnormality.  Atrophy and chronic small vessel ischemia.  Pt has had multiple recent admits to the Hospital per chart/MD.      SLP Plan  All goals met      Recommendations for follow up therapy are one component of a multi-disciplinary discharge planning process, led by the attending physician.  Recommendations may be updated based on patient status, additional functional criteria and insurance authorization.    Recommendations  Diet recommendations: Dysphagia 2 (fine chop);Dysphagia 1 (puree);Thin liquid (gravies) Liquids provided via: Cup;Straw Medication Administration: Crushed with puree (vs whole in puree) Supervision: Patient able to self feed;Intermittent supervision to cue for compensatory strategies (setup and sitting up) Compensations: Minimize environmental distractions;Slow rate;Small sips/bites;Lingual sweep for clearance of pocketing;Follow solids with liquid Postural Changes and/or Swallow Maneuvers: Out of bed for meals;Seated upright 90 degrees;Upright 30-60 min after meal                 (Dietician f/u) Oral care BID;Staff/trained caregiver to provide oral care   Intermittent Supervision/Assistance Dysphagia, oral phase (R13.11) (Baseline Cognitive decline suspected per MD; Edentulous)     All goals met       [name], MS, CCC-SLP Speech Language Pathologist Rehab Services; [facility] -  [phone] (ascom) Laurette Villescas  11/06/2023, 4:03 PM

## 2023-11-06 NOTE — Plan of Care (Signed)
  Problem: Skin Integrity: Goal: Risk for impaired skin integrity will decrease Outcome: Progressing   Problem: Tissue Perfusion: Goal: Adequacy of tissue perfusion will improve Outcome: Progressing   Problem: Education: Goal: Knowledge of General Education information will improve Description: Including pain rating scale, medication(s)/side effects and non-pharmacologic comfort measures Outcome: Progressing   Problem: Activity: Goal: Risk for activity intolerance will decrease Outcome: Progressing   Problem: Nutrition: Goal: Adequate nutrition will be maintained Outcome: Progressing   Problem: Coping: Goal: Level of anxiety will decrease Outcome: Progressing   Problem: Elimination: Goal: Will not experience complications related to bowel motility Outcome: Progressing Goal: Will not experience complications related to urinary retention Outcome: Progressing   Problem: Pain Managment: Goal: General experience of comfort will improve and/or be controlled Outcome: Progressing   Problem: Safety: Goal: Ability to remain free from injury will improve Outcome: Progressing

## 2023-11-06 NOTE — Plan of Care (Addendum)
 Patient is alert and oriented X 2. He is still impulsive but redirectable . Denies any pain.  Problem: Education: Goal: Ability to describe self-care measures that may prevent or decrease complications (Diabetes Survival Skills Education) will improve Outcome: Progressing Goal: Individualized Educational Video(s) Outcome: Progressing   Problem: Coping: Goal: Ability to adjust to condition or change in health will improve Outcome: Progressing   Problem: Fluid Volume: Goal: Ability to maintain a balanced intake and output will improve Outcome: Progressing   Problem: Nutritional: Goal: Maintenance of adequate nutrition will improve Outcome: Progressing Goal: Progress toward achieving an optimal weight will improve Outcome: Progressing   Problem: Skin Integrity: Goal: Risk for impaired skin integrity will decrease Outcome: Progressing   Problem: Health Behavior/Discharge Planning: Goal: Ability to manage health-related needs will improve Outcome: Progressing   Problem: Clinical Measurements: Goal: Ability to maintain clinical measurements within normal limits will improve Outcome: Progressing Goal: Will remain free from infection Outcome: Progressing Goal: Diagnostic test results will improve Outcome: Progressing Goal: Respiratory complications will improve Outcome: Progressing Goal: Cardiovascular complication will be avoided Outcome: Progressing

## 2023-11-06 NOTE — Progress Notes (Signed)
 Occupational Therapy Treatment Patient Details Name: [name] MRN: [id number] DOB: 01-14-1951 Today's Date: 11/06/2023   History of present illness Pt is a 73 y.o. male presenting to hospital 10/31/23 with AMS and decreased responsiveness.  Pt admitted with acute hepatic encephalopathy, pancytopenia, acute dysphagia.  PMH includes asthma, CHF, COPD, DM, htn, sleep apnea, cardiac cath, eye sx, inner ear sx, nose sx, hepatitis C, liver cirrhosis, recurrent hepatic encephalopathy, TIA, PTSD, ACDF 2024.   OT comments  Pt agreeable to participate in OT, reporting readiness for OOB activities d/t soreness of buttocks with lying in bed.  Upon standing, pt had leakage from Purewick.  Pt participated in gown change, LB sink bath, and sock change d/t urine leakage; see below for details.  Pt demonstrates good ability to reach to all body parts during ADLs, but ultimately requires OT assist for thoroughness with grooming/bathing tasks.  Multiple STS from bedside to complete above noted tasks, requiring min A d/t posterior lean.  Close supv for standing tasks when pt was able to support 1 arm on sink countertop for balance.  Ambulatory to bathroom after clothing changed d/t pt eager to continue mobility.  Pt requires min vc for walker safety within bathroom, but overall did well to negotiate through doorways, side stepping, backward stepping, all with min guard and RW.  Assisted back to bed with all needed items within reach.  Will continue to follow in the acute setting to maximize safety and indep with daily tasks.        If plan is discharge home, recommend the following:  Assistance with feeding;Assist for transportation;Direct supervision/assist for medications management;Help with stairs or ramp for entrance;Supervision due to cognitive status;Direct supervision/assist for financial management;Assistance with cooking/housework;A lot of help with bathing/dressing/bathroom   Equipment Recommendations   Other (comment) (defer to next venue of care)    Recommendations for Other Services      Precautions / Restrictions Precautions Precautions: Fall Recall of Precautions/Restrictions: Impaired Restrictions Weight Bearing Restrictions Per Provider Order: No       Mobility Bed Mobility Overal bed mobility: Needs Assistance Bed Mobility: Supine to Sit, Sit to Supine     Supine to sit: Supervision Sit to supine: Supervision   General bed mobility comments: SBA for safety but no physical assist required Patient Response: Cooperative  Transfers Overall transfer level: Needs assistance Equipment used: Rolling walker (2 wheels) Transfers: Sit to/from Stand Sit to Stand: Min assist           General transfer comment: min A d/t posterior lean on multiple trials of STS from bedside.     Balance Overall balance assessment: Needs assistance Sitting-balance support: Bilateral upper extremity supported, Feet supported Sitting balance-Leahy Scale: Good Sitting balance - Comments: able to don/doff socks sitting EOB   Standing balance support: Single extremity supported, Reliant on assistive device for balance, During functional activity Standing balance-Leahy Scale: Good Standing balance comment: several slight LOB posteriorly while moving between bedside and sink for grooming/bathing ADLs                           ADL either performed or assessed with clinical judgement   ADL Overall ADL's : Needs assistance/impaired     Grooming: Wash/dry hands;Contact guard assist;Standing       Lower Body Bathing: Moderate assistance;Sit to/from stand Lower Body Bathing Details (indicate cue type and reason): Able to reach all parts of LB, including groin and buttocks, but requiring assist for  thoroughness. Upper Body Dressing : Moderate assistance Upper Body Dressing Details (indicate cue type and reason): Gown change Lower Body Dressing: Supervision/safety;Set up Lower Body  Dressing Details (indicate cue type and reason): able to doff wet socks and don clean socks sitting EOB, crossing legs; would require at least min A for hiking underwear or pants d/t balance deficits     Toileting- Clothing Manipulation and Hygiene: Maximal assistance Toileting - Clothing Manipulation Details (indicate cue type and reason): In standing, able to participate in wiping groin and buttocks after Purewick leaked, but overall max A for thoroughness     Functional mobility during ADLs: Contact guard assist;Rolling walker (2 wheels);Cueing for sequencing;Cueing for safety      Extremity/Trunk Assessment Upper Extremity Assessment Upper Extremity Assessment: Generalized weakness   Lower Extremity Assessment Lower Extremity Assessment: Generalized weakness        Vision       Perception     Praxis     Communication     Cognition Arousal: Alert Behavior During Therapy: WFL for tasks assessed/performed Cognition: Cognition impaired, History of cognitive impairments             OT - Cognition Comments: Oriented to person, place, situation today                   Following commands impaired: Follows one step commands inconsistently      Cueing   Cueing Techniques: Verbal cues, Gestural cues, Tactile cues, Visual cues  Exercises      Shoulder Instructions       General Comments pleasant, cooperative, impulsive, but continues to be easily redirected    Pertinent Vitals/ Pain       Pain Assessment Pain Assessment: No/denies pain  Home Living                                          Prior Functioning/Environment              Frequency  Min 1X/week        Progress Toward Goals  OT Goals(current goals can now be found in the care plan section)  Progress towards OT goals: Progressing toward goals  Acute Rehab OT Goals Patient Stated Goal: to leave hospital OT Goal Formulation: With patient Time For Goal  Achievement: 11/08/23 Potential to Achieve Goals: Good  Plan      Co-evaluation                 AM-PAC OT "6 Clicks" Daily Activity     Outcome Measure   Help from another person eating meals?: A Lot Help from another person taking care of personal grooming?: A Lot Help from another person toileting, which includes using toliet, bedpan, or urinal?: A Lot Help from another person bathing (including washing, rinsing, drying)?: A Lot Help from another person to put on and taking off regular upper body clothing?: A Little Help from another person to put on and taking off regular lower body clothing?: A Little 6 Click Score: 14    End of Session Equipment Utilized During Treatment: Gait belt;Rolling walker (2 wheels)  OT Visit Diagnosis: Unsteadiness on feet (R26.81);Other abnormalities of gait and mobility (R26.89);Muscle weakness (generalized) (M62.81);Other symptoms and signs involving cognitive function   Activity Tolerance Patient tolerated treatment well   Patient Left in bed;with call bell/phone within reach;with bed alarm set   Nurse  Communication          Time: 9629-5284 OT Time Calculation (min): 25 min  Charges: OT General Charges $OT Visit: 1 Visit OT Treatments $Self Care/Home Management : 23-37 mins  Danelle Earthly, MS, OTR/L   Otis Dials 11/06/2023, 3:19 PM

## 2023-11-06 NOTE — Progress Notes (Signed)
 Progress Note   Patient: [name] NWG:[id number] DOB: 07/22/1951 DOA: 10/31/2023     5 DOS: the patient was seen and examined on 11/06/2023   Brief hospital course: 73 y.o. male with medical history significant of hepatitis C, liver cirrhosis, hepatic encephalopathy recurrent, TIA, chronic HFpEF, CAD, COPD, IIDM, HTN, HLD, sent from group home for evaluation of altered mentations.   Patient is confused and unable to provide any history, all history provided by ED physician and review of patient chart.  Patient was found to be confused this morning and sent to ED.  Unclear whether patient has been compliant with his lactulose.  ED workup found significant increase of ammonium level 2.43, blood work showed persistent pancytopenia WBC 2.4, hemoglobin 12.0 compared to baseline 10-11, platelet 83.  CT head reassuring.   NGT inserted on admission.  Patient mental status has improved slightly to the point that he can eat and swallow by mouth with limited risk of aspiration.  NGT will be discontinued 3/7.   3/10: Patient much more obtunded today.  Responsive only to noxious stimulus.  CT head and ABG reassuring.  Patient has been not adherent with lactulose, refusing doses in hospital.  I suspect this is breakthrough hepatic encephalopathy secondary to nonadherence with lactulose therapy.   3/11: NGT placed yesterday.  Lactulose administered.  Mental status much improved today  3/12: Awake, oriented to person and place     Assessment and Plan:  Principal Problem:   Hepatic encephalopathy (HCC) Active Problems:   Acute hepatic encephalopathy (HCC)    Acute hepatic encephalopathy, recurrent -Suspect noncompliant with lactulose -Patient was improving clinically however had been refusing doses of lactulose on 3/9 so had some breakthrough hepatic encephalopathy on 3/10.  ABG and CT head normal.  Ammonia markedly elevated at 134.  NGT placed 3/10.  Lactulose administered for several doses.   Mental status improved and back to baseline Plan: Continue lactulose 30 g 3 times daily p.o.  Titrate to 3-4 soft bowel movements daily.  Titrate to improvement in mental status.  Avoid diarrhea.    Advance hepatic cirrhosis Patient is extremely brittle cirrhotic.  Mental status declines significantly with even 1 day of missed lactulose.  Overall prognosis is poor.  Patient is a DNR.  May be an appropriate candidate for further de-escalation and consideration of hospice services. Awaiting palliative care consult    Pancytopenia -Secondary to cirrhosis -H&H stable, no signs of active bleeding, no signs of active infection.    Acute dysphagia -Secondary to acute encephalopathy -Improved.  Patient okay for p.o. intake   HTN Blood pressure is stable on Lasix, Cozaar, Aldactone    Chronic HFpEF -Euvolemic, no acute concerns -Continue Lasix and spironolactone    COPD -Not acutely exacerbated    Major depression disorder -Continue Celexa hydroxyzine as needed and Seroquel as needed - Mental status has been appropriate   Bradycardia Asymptomatic Check TSH      Subjective: Patient is awake, alert and oriented to person and place.  Requesting to be discharged  Physical Exam: Vitals:   11/05/23 1646 11/05/23 1950 11/06/23 0356 11/06/23 0745  BP: (!) 119/49 (!) 124/49 (!) 136/59 138/70  Pulse: (!) 52 (!) 54 (!) 45 (!) 45  Resp: 18 17  16   Temp: 98.1 F (36.7 C) 98.3 F (36.8 C) 98.5 F (36.9 C) 97.9 F (36.6 C)  TempSrc: Oral     SpO2: 100% 100% 100% 100%   General exam: No acute distress.  Appears fatigued Respiratory  system: Lungs clear, normal WOB, RA Cardiovascular system: S1-S2 bradycardic,, no murmurs, no pedal edema Gastrointestinal system: Mild distention, nontender Central nervous system: Alert.  Oriented x 3. Extremities: Symmetric 5 x 5 power. Skin: No rashes, lesions or ulcers Psychiatry: Mood and affect flattened      Data Reviewed: Labs  reviewed. At patient's baseline There are no new results to review at this time.  Family Communication: None  Disposition: Status is: Inpatient Remains inpatient appropriate because: Discharge planning  Planned Discharge Destination: Home with Home Health    Time spent: 33 minutes  Author: Lucile Shutters, MD 11/06/2023 12:44 PM  For on call review [email or web address].

## 2023-11-07 DIAGNOSIS — K7682 Hepatic encephalopathy: Secondary | ICD-10-CM | POA: Diagnosis not present

## 2023-11-07 LAB — GLUCOSE, CAPILLARY
Glucose-Capillary: 79 mg/dL (ref 70–99)
Glucose-Capillary: 109 mg/dL — ABNORMAL HIGH (ref 70–99)

## 2023-11-07 MED ORDER — FUROSEMIDE 20 MG PO TABS: 20.0000 mg | ORAL_TABLET | Freq: Every day | ORAL | 0 refills | Status: DC

## 2023-11-07 NOTE — Discharge Summary (Signed)
 Physician Discharge Summary   Patient: [name] MRN: [id number] DOB: [birth year]  Admit date:     10/31/2023  Discharge date: 11/07/23  Discharge Physician: Brihana Quickel   PCP: [facility]   Recommendations at discharge:   Take lactulose as recommended  Discharge Diagnoses: Principal Problem:   Hepatic encephalopathy (HCC) Active Problems:   Acute metabolic encephalopathy   Diabetes mellitus type 2, noninsulin dependent (HCC)   Essential hypertension   Sinus bradycardia   CAD (coronary artery disease)   Pancytopenia (HCC)   COPD (chronic obstructive pulmonary disease) (HCC)   Chronic diastolic CHF (congestive heart failure) (HCC)   Depression  Resolved Problems:   * No resolved hospital problems. *  Hospital Course: CACHE BILLS is a 73 y.o. male with medical history significant of hepatitis C, liver cirrhosis, hepatic encephalopathy recurrent, TIA, chronic HFpEF, CAD, COPD, IIDM, HTN, HLD, sent from group home for evaluation of altered mentations.   Patient is confused and unable to provide any history, all history provided by ED physician and review of patient chart.  Patient was found to be confused this morning and sent to ED.  Unclear whether patient has been compliant with his lactulose.  ED workup found significant increase of ammonium level 2.43, blood work showed persistent pancytopenia WBC 2.4, hemoglobin 12.0 compared to baseline 10-11, platelet 83.  CT head reassuring.   NGT inserted and patient received lactulose x 1 in the ED.  Assessment and Plan:  Advance hepatic cirrhosis Acute hepatic encephalopathy, recurrent Acute metabolic encephalopathy -Suspect secondary to noncompliance with lactulose -Patient was improving clinically however had refused doses of lactulose on 3/9 so had some breakthrough hepatic encephalopathy on 3/10.  ABG and CT head were normal.  Ammonia markedly elevated at 134.  NGT placed 3/10.  Lactulose administered  for several doses.  Mental status improved and back to baseline Continue lactulose 30 g 3 times daily p.o.  Titrate to 3-4 soft bowel movements daily.   Patient is an extremely brittle cirrhotic.  Mental status declines significantly with even 1 day of missed lactulose.  Overall prognosis is poor.  Patient is a DNR.   [facility] care consult     Pancytopenia -Secondary to cirrhosis -H&H stable, no signs of active bleeding, no signs of active infection.     Acute dysphagia -Secondary to altered mental status from acute encephalopathy -Improved.  Patient okay for p.o. intake    HTN Blood pressure is stable on Lasix, Cozaar, Aldactone     Chronic HFpEF -Euvolemic, no acute concerns -Continue Lasix and spironolactone as much as blood pressure tolerates     COPD -Not acutely exacerbated -Continue as needed bronchodilator therapy     Major depression disorder -Continue Celexa, hydroxyzine as needed and Seroquel as needed - Mental status has been appropriate     Bradycardia Asymptomatic TSH is within normal limits           Consultants: Palliative care Procedures performed: None  Disposition: Home health Diet recommendation:  Discharge Diet Orders (From admission, onward)     Start     Ordered   11/07/23 0000  Diet - low sodium heart healthy        11/07/23 1039           Cardiac diet DISCHARGE MEDICATION: Allergies as of 11/07/2023       Reactions   Bee Venom Anaphylaxis   Codeine Itching   Only itching. Recently allergy tested at [facility]   Rsv  Mrna Pre-f Virus Vaccine Swelling   Meloxicam    Other Reaction(s): ?overdose of local given at dentist--had trouble breathing   Codeine    Novocain [procaine]    Sulfa Antibiotics Other (See Comments)   Sulfa Antibiotics    hallucinations        Medication List     STOP taking these medications    EYE MULTIVITAMIN PO       TAKE these medications    acetaminophen 650 MG CR tablet Commonly  known as: TYLENOL Take 650 mg by mouth every 8 (eight) hours as needed for pain.   atorvastatin 40 MG tablet Commonly known as: LIPITOR Take 40 mg by mouth daily.   citalopram 20 MG tablet Commonly known as: CELEXA Take 20 mg by mouth daily.   feeding supplement Liqd Take 237 mLs by mouth 3 (three) times daily between meals.   furosemide 20 MG tablet Commonly known as: LASIX Take 1 tablet (20 mg total) by mouth daily.   gabapentin 300 MG capsule Commonly known as: NEURONTIN Take 300 mg by mouth 2 (two) times daily.   Jardiance 10 MG Tabs tablet Generic drug: empagliflozin Take 10 mg by mouth daily.   lactulose 10 GM/15ML solution Commonly known as: CHRONULAC Take 45 mLs (30 g total) by mouth 3 (three) times daily.   losartan 50 MG tablet Commonly known as: COZAAR Take 50 mg by mouth daily.   multivitamin with minerals Tabs tablet Take 1 tablet by mouth daily.   nitroGLYCERIN 0.4 MG/SPRAY spray Commonly known as: NITROLINGUAL Place 1 spray under the tongue every 5 (five) minutes x 3 doses as needed for chest pain.   ondansetron 4 MG tablet Commonly known as: ZOFRAN Take 1 tablet (4 mg total) by mouth every 6 (six) hours as needed for nausea.   pantoprazole 40 MG tablet Commonly known as: PROTONIX Take 40 mg by mouth daily.   phenol 1.4 % Liqd Commonly known as: CHLORASEPTIC Use as directed 1 spray in the mouth or throat as needed for throat irritation / pain.   QUEtiapine 25 MG tablet Commonly known as: SEROQUEL Take 25 mg by mouth at bedtime as needed (Agitation/sleep).   senna 8.6 MG Tabs tablet Commonly known as: SENOKOT Take 1 tablet (8.6 mg total) by mouth 2 (two) times daily as needed for mild constipation.   spironolactone 25 MG tablet Commonly known as: ALDACTONE Take 25 mg by mouth daily.   Ventolin HFA 108 (90 Base) MCG/ACT inhaler Generic drug: albuterol Inhale 2 puffs into the lungs every 4 (four) hours as needed for wheezing.    albuterol (2.5 MG/3ML) 0.083% nebulizer solution Commonly known as: PROVENTIL Take 2.5 mg by nebulization every 4 (four) hours as needed for wheezing or shortness of breath.   Xifaxan 550 MG Tabs tablet Generic drug: rifaximin Take 550 mg by mouth 2 (two) times daily.        Follow-up Information     [facility] Follow up.   Why: Hospital follow up Contact information: [address] [place] [address] [phone]                Discharge Exam: There were no vitals filed for this visit.  General exam: No acute distress.  Appears fatigued Respiratory system: Lungs clear, Cardiovascular system: S1-S2 bradycardic,, no murmurs, no pedal edema Gastrointestinal system: Mild distention, nontender Central nervous system: Alert.  Oriented x 3. Extremities: Symmetric 5 x 5 power. Skin: No rashes, lesions or ulcers Psychiatry: Mood  and affect flattened     Condition at discharge: stable  The results of significant diagnostics from this hospitalization (including imaging, microbiology, ancillary and laboratory) are listed below for reference.   Imaging Studies: DG Abd 1 View Result Date: 11/04/2023 CLINICAL DATA:  [id number] Encounter for imaging study to confirm nasogastric (NG) tube placement [id number] EXAM: ABDOMEN - 1 VIEW COMPARISON:  10/31/2023 FINDINGS: Nasogastric tube tip is in the left upper abdomen likely in the gastric body region. Left lung is clear. No gross abnormality to the heart and mediastinum. Possible calcifications in the right upper abdomen. IMPRESSION: Nasogastric tube tip is in the stomach. Electronically Signed   By: Richarda Overlie M.D.   On: 11/04/2023 14:30   CT HEAD WO CONTRAST ( ) Result Date: 11/04/2023 CLINICAL DATA:  Mental status change of unknown cause. Hepatic encephalopathy. EXAM: CT HEAD WITHOUT CONTRAST TECHNIQUE: Contiguous axial images were obtained from the base of the skull through the vertex without intravenous  contrast. RADIATION DOSE REDUCTION: This exam was performed according to the departmental dose-optimization program which includes automated exposure control, adjustment of the mA and/or kV according to patient size and/or use of iterative reconstruction technique. COMPARISON:  10/31/2023 FINDINGS: Brain: Age related volume loss. No evidence of old or acute focal infarction, mass lesion, hemorrhage, hydrocephalus or extra-axial collection. Vascular: There is atherosclerotic calcification of the major vessels at the base of the brain. Skull: Negative Sinuses/Orbits: Previous functional endoscopic sinus surgery. No active inflammation. Orbits negative. Other: None IMPRESSION: No acute CT finding. Age related volume loss. Atherosclerotic calcification of the major vessels at the base of the brain. Electronically Signed   By: Paulina Fusi M.D.   On: 11/04/2023 12:22   DG Abd Portable 1 View Result Date: 10/31/2023 CLINICAL DATA:  NG tube position EXAM: PORTABLE ABDOMEN - 1 VIEW limited for tube placement COMPARISON:  X-ray earlier 10/31/2023. FINDINGS: Limited x-ray for tube placement of the upper abdomen has tip overlying the midbody of the stomach but side hole at the GE junction. This could be advanced further into the stomach. Elsewhere surgical clips in the right upper quadrant. Gas seen along nondilated loops of visualized small and large bowel. Scattered colonic stool. Overlapping cardiac leads. IMPRESSION: Limited x-ray for tube placement now has tip of the catheter overlying the mid stomach but the side hole at the GE junction. This could be advanced further into the stomach. Electronically Signed   By: Karen Kays M.D.   On: 10/31/2023 16:59   DG Abd Portable 1 View Result Date: 10/31/2023 CLINICAL DATA:  NG tube placement EXAM: PORTABLE ABDOMEN - 1 VIEW.  Limited for tube placement COMPARISON:  None Available. FINDINGS: Limited x-ray of the lower chest are upper abdomen demonstrates the enteric tube  overlying the mediastinum and curved back upon itself, likely folded in the esophagus. Recommend this be removed and repositioned. In the upper abdomen note is made of moderate diffuse colonic stool. Gas is seen in nondilated loops of bowel. Surgical clips in the right upper quadrant. Overlapping cardiac leads. Critical Value/emergent results were called by telephone at the time of interpretation on 10/31/2023 at 2:13 pm to provider [facility] , who verbally acknowledged these results. IMPRESSION: Limited x-ray of the lower chest are upper abdomen demonstrates the enteric tube overlying the mediastinum and curved back upon itself, likely folded in the esophagus. Recommend this be removed and repositioned. Electronically Signed   By: Karen Kays M.D.   On: 10/31/2023 14:13   CT Head Wo Contrast  Result Date: 10/31/2023 CLINICAL DATA:  Mental status change, unknown cause EXAM: CT HEAD WITHOUT CONTRAST TECHNIQUE: Contiguous axial images were obtained from the base of the skull through the vertex without intravenous contrast. RADIATION DOSE REDUCTION: This exam was performed according to the departmental dose-optimization program which includes automated exposure control, adjustment of the mA and/or kV according to patient size and/or use of iterative reconstruction technique. COMPARISON:  10/21/2023 FINDINGS: Brain: No acute intracranial abnormality. Specifically, no hemorrhage, hydrocephalus, mass lesion, acute infarction, or significant intracranial injury. Vascular: No hyperdense vessel or unexpected calcification. Skull: No acute calvarial abnormality. Sinuses/Orbits: No acute findings Other: None IMPRESSION: No acute intracranial abnormality. Electronically Signed   By: Charlett Nose M.D.   On: 10/31/2023 11:43   DG Chest Port 1 View Result Date: 10/31/2023 CLINICAL DATA:  Altered mental status EXAM: PORTABLE CHEST 1 VIEW COMPARISON:  09/30/2023 FINDINGS: The heart size and mediastinal contours are within  normal limits. Both lungs are clear. The visualized skeletal structures are unremarkable. IMPRESSION: No active disease. Electronically Signed   By: Charlett Nose M.D.   On: 10/31/2023 11:43   CT Head Wo Contrast Result Date: 10/21/2023 CLINICAL DATA:  Mental status change, unknown cause. EXAM: CT HEAD WITHOUT CONTRAST TECHNIQUE: Contiguous axial images were obtained from the base of the skull through the vertex without intravenous contrast. RADIATION DOSE REDUCTION: This exam was performed according to the departmental dose-optimization program which includes automated exposure control, adjustment of the mA and/or kV according to patient size and/or use of iterative reconstruction technique. COMPARISON:  CT head without contrast 09/30/2023 FINDINGS: Brain: No acute infarct, hemorrhage, or mass lesion is present. Mild atrophy and white matter changes are similar the prior exam. The ventricles are of normal size. No significant extraaxial fluid collection is present. The brainstem and cerebellum are within normal limits. Midline structures are within normal limits. Vascular: No hyperdense vessel or unexpected calcification. Skull: Calvarium is intact. No focal lytic or blastic lesions are present. No significant extracranial soft tissue lesion is present. Sinuses/Orbits: The paranasal sinuses and mastoid air cells are clear. The globes and orbits are within normal limits. IMPRESSION: 1. No acute intracranial abnormality or significant interval change. 2. Mild atrophy and white matter disease likely reflects the sequela of chronic microvascular ischemia. Electronically Signed   By: Marin Roberts M.D.   On: 10/21/2023 19:40    Microbiology: Results for orders placed or performed during the hospital encounter of 10/31/23  Culture, blood (routine x 2)     Status: None   Collection Time: 10/31/23  9:54 AM   Specimen: BLOOD  Result Value Ref Range Status   Specimen Description BLOOD BLOOD LEFT ARM  Final    Special Requests   Final    BOTTLES DRAWN AEROBIC ONLY Blood Culture results may not be optimal due to an inadequate volume of blood received in culture bottles   Culture   Final    NO GROWTH 5 DAYS Performed at [facility], [address] [place], [place] [address]    Report Status 11/05/2023 FINAL  Final  Culture, blood (routine x 2)     Status: None   Collection Time: 10/31/23  9:54 AM   Specimen: BLOOD  Result Value Ref Range Status   Specimen Description BLOOD BLOOD RIGHT ARM  Final   Special Requests   Final    BOTTLES DRAWN AEROBIC AND ANAEROBIC Blood Culture results may not be optimal due to an inadequate volume of blood received in culture bottles  Culture   Final    NO GROWTH 5 DAYS Performed at [facility], [address] [place], [place] [address]    Report Status 11/05/2023 FINAL  Final  Resp panel by RT-PCR (RSV, Flu A&B, Covid) Anterior Nasal Swab     Status: None   Collection Time: 10/31/23 10:10 AM   Specimen: Anterior Nasal Swab  Result Value Ref Range Status   SARS Coronavirus 2 by RT PCR NEGATIVE NEGATIVE Final    Comment: (NOTE) SARS-CoV-2 target nucleic acids are NOT DETECTED.  The SARS-CoV-2 RNA is generally detectable in upper respiratory specimens during the acute phase of infection. The lowest concentration of SARS-CoV-2 viral copies this assay can detect is 138 copies/mL. A negative result does not preclude SARS-Cov-2 infection and should not be used as the sole basis for treatment or other patient management decisions. A negative result may occur with  improper specimen collection/handling, submission of specimen other than nasopharyngeal swab, presence of viral mutation(s) within the areas targeted by this assay, and inadequate number of viral copies(<138 copies/mL). A negative result must be combined with clinical observations, patient history, and epidemiological information. The expected result is Negative.  Fact  Sheet for Patients:  BloggerCourse.com  Fact Sheet for Healthcare Providers:  SeriousBroker.it  This test is no t yet approved or cleared by the [place] FDA and  has been authorized for detection and/or diagnosis of SARS-CoV-2 by FDA under an Emergency Use Authorization (EUA). This EUA will remain  in effect (meaning this test can be used) for the duration of the COVID-19 declaration under Section 564(b)(1) of the Act, 21 U.S.C.section 360bbb-3(b)(1), unless the authorization is terminated  or revoked sooner.       Influenza A by PCR NEGATIVE NEGATIVE Final   Influenza B by PCR NEGATIVE NEGATIVE Final    Comment: (NOTE) The Xpert Xpress SARS-CoV-2/FLU/RSV plus assay is intended as an aid in the diagnosis of influenza from Nasopharyngeal swab specimens and should not be used as a sole basis for treatment. Nasal washings and aspirates are unacceptable for Xpert Xpress SARS-CoV-2/FLU/RSV testing.  Fact Sheet for Patients: BloggerCourse.com  Fact Sheet for Healthcare Providers: SeriousBroker.it  This test is not yet approved or cleared by the [place] FDA and has been authorized for detection and/or diagnosis of SARS-CoV-2 by FDA under an Emergency Use Authorization (EUA). This EUA will remain in effect (meaning this test can be used) for the duration of the COVID-19 declaration under Section 564(b)(1) of the Act, 21 U.S.C. section 360bbb-3(b)(1), unless the authorization is terminated or revoked.     Resp Syncytial Virus by PCR NEGATIVE NEGATIVE Final    Comment: (NOTE) Fact Sheet for Patients: BloggerCourse.com  Fact Sheet for Healthcare Providers: SeriousBroker.it  This test is not yet approved or cleared by the [place] FDA and has been authorized for detection and/or diagnosis of SARS-CoV-2 by FDA under an  Emergency Use Authorization (EUA). This EUA will remain in effect (meaning this test can be used) for the duration of the COVID-19 declaration under Section 564(b)(1) of the Act, 21 U.S.C. section 360bbb-3(b)(1), unless the authorization is terminated or revoked.  Performed at [facility], [address] [place], [place] [address]     Labs: CBC: Recent Labs  Lab 10/31/23 [phone] 11/02/23 0822 11/06/23 0909  WBC 2.4* 2.7* 3.5*  NEUTROABS 1.0* 1.2*  --   HGB 12.0* 10.1* 10.4*  HCT 36.7* 29.4* 29.8*  MCV 94.3 92.7 90.9  PLT 83* 57* 63*  Basic Metabolic Panel: Recent Labs  Lab 10/31/23 0955 11/02/23 0822 11/06/23 0909  NA 143 140 135  K 4.1 3.8 4.0  CL 112* 111 106  CO2 24 25 23   GLUCOSE 93 82 87  BUN 18 18 21   CREATININE 1.26* 0.99 0.94  CALCIUM 10.1 9.7 9.9   Liver Function Tests: Recent Labs  Lab 10/31/23 0955  AST 42*  ALT 31  ALKPHOS 65  BILITOT 1.3*  PROT 6.6  ALBUMIN 2.7*   CBG: Recent Labs  Lab 11/06/23 0749 11/06/23 1159 11/06/23 1615 11/06/23 2030 11/07/23 0757  GLUCAP 74 116* 127* 131* 79    Discharge time spent: greater than 30 minutes.  Signed: Lucile Shutters, MD [facility] 11/07/2023

## 2023-11-07 NOTE — Plan of Care (Signed)
  Problem: Education: Goal: Ability to describe self-care measures that may prevent or decrease complications (Diabetes Survival Skills Education) will improve Outcome: Progressing   Problem: Nutritional: Goal: Maintenance of adequate nutrition will improve Outcome: Progressing   Problem: Skin Integrity: Goal: Risk for impaired skin integrity will decrease Outcome: Progressing   Problem: Clinical Measurements: Goal: Will remain free from infection Outcome: Progressing   Problem: Activity: Goal: Risk for activity intolerance will decrease Outcome: Progressing   Problem: Nutrition: Goal: Adequate nutrition will be maintained Outcome: Progressing   Problem: Pain Managment: Goal: General experience of comfort will improve and/or be controlled Outcome: Progressing   Problem: Safety: Goal: Ability to remain free from injury will improve Outcome: Progressing

## 2023-11-07 NOTE — [place] FL2 (Signed)
 [place] MEDICAID FL2 LEVEL OF CARE FORM     IDENTIFICATION  Patient Name: [name] Birthdate: 30-Nov-1950 Sex: male Admission Date (Current Location): 10/31/2023  [place] and [place] Number:  Chiropodist and Address:  [facility], [address] [place], [place] [address]      Provider Number: [id number]  Attending Physician Name and Address:  Lucile Shutters, MD  Relative Name and Phone Number:       Current Level of Care: Hospital Recommended Level of Care: [facility] (with PT and OT) Prior Approval Number:    Date Approved/Denied:   PASRR Number: [id number]  Discharge Plan: Other (Comment) [facility] with PT and OT.)    Current Diagnoses: Patient Active Problem List   Diagnosis Date Noted   Type II diabetes mellitus with renal manifestations (HCC) 10/21/2023   Chronic kidney disease, stage 3a (HCC) 10/21/2023   Myocardial injury 10/21/2023   CKD stage 3a, GFR 45-59 ml/min (HCC) - baseline scr 1.3 10/01/2023   Hepatic encephalopathy (HCC) 09/30/2023   Liver cirrhosis (HCC) 09/30/2023   Essential hypertension 09/30/2023   Diabetes mellitus type 2, noninsulin dependent (HCC) 09/30/2023   CAD (coronary artery disease) 09/30/2023   Hyperlipidemia 09/30/2023   Hepatic encephalopathy (HCC) 08/01/2023   Hyperammonemia (HCC) 07/30/2023   Cervical spinal stenosis 07/01/2023   Cervical myelopathy (HCC) 06/21/2023   Cervical stenosis of spine 06/21/2023   Cervical radiculopathy 06/20/2023   E coli bacteremia 06/17/2023   Left leg cellulitis 03/23/2023   Leukocytosis 03/23/2023   Serum total bilirubin elevated 03/23/2023   Left leg pain 03/23/2023   Suicidal ideation 01/14/2023   Acute pain 01/14/2023   Adrenal insufficiency (HCC) 12/26/2022   Orthostatic hypotension 12/24/2022   Rib pain on right side 12/24/2022   Hepatitis B infection 12/24/2022   Myocardial ischemia 12/24/2022   Acute appendicitis 11/05/2022    Pancytopenia (HCC) 11/05/2022   Sinus bradycardia 11/05/2022   Acute metabolic encephalopathy 09/22/2022   Liver lesion 09/22/2022   Hyperlipidemia, unspecified 09/22/2022   Type 2 diabetes mellitus with hyperlipidemia (HCC) 09/22/2022   Obesity (BMI 30-39.9) 09/22/2022   Acute hepatic encephalopathy (HCC) 09/22/2022   MDD (major depressive disorder), recurrent severe, 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   Major depressive disorder, 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 psychotic features (HCC) 09/15/2022   Protein-calorie malnutrition, severe (HCC) 08/31/2022   Hypotension 08/31/2022   Weakness    Hx of transient ischemic attack (TIA) 04/28/2022   Obesity    Chronic diastolic CHF (congestive heart failure) (HCC)    Depression    Sepsis (HCC) 03/28/2022   Bacteremia due to Klebsiella pneumoniae 03/28/2022   Hypoglycemia 03/28/2022   Syncope 03/24/2022   Hypokalemia 03/24/2022   Fever 03/24/2022   COPD (chronic obstructive pulmonary disease) (HCC)    Elevated troponin    Abnormal LFTs    History of hepatitis C    AKI (acute kidney injury) (HCC)    Pure hypercholesterolemia    Obesity, Class III, BMI 40-49.9 (morbid obesity) (HCC) 04/18/2020   Thrombocytopenia (HCC) 04/17/2020   Leukopenia 04/17/2020   HTN (hypertension) 04/17/2018   Uncontrolled type 2 diabetes mellitus with hyperglycemia, without long-term current use of insulin (HCC) 04/17/2018   Lymphedema 04/17/2018   Normocytic anemia 04/07/2018   Chest pain 03/20/2018   Unsheltered homelessness 12/16/2017    Orientation RESPIRATION BLADDER Height & Weight     Self, Place  Normal Incontinent Weight:   Height:     BEHAVIORAL SYMPTOMS/MOOD NEUROLOGICAL BOWEL NUTRITION STATUS   (  None)  (None) Incontinent Diet (Low sodium, heart healthy)  AMBULATORY STATUS COMMUNICATION OF NEEDS Skin   Limited Assist Verbally Other (Comment) (Erythema/redness.)                       Personal Care Assistance Level of Assistance  Bathing,  Feeding, Dressing Bathing Assistance: Limited assistance Feeding assistance: Limited assistance Dressing Assistance: Limited assistance     Functional Limitations Info  Sight, Hearing, Speech Sight Info: Adequate Hearing Info: Adequate Speech Info: Adequate    SPECIAL CARE FACTORS FREQUENCY  PT (By licensed PT), OT (By licensed OT)     PT Frequency: 3 x week OT Frequency: 3 x week            Contractures Contractures Info: Not present    Additional Factors Info  Code Status, Allergies Code Status Info: DNR Allergies Info: Bee Venom, Codeine, Rsv Mrna Pre-f Virus Vaccine, Meloxicam, Codeine, Novocain (Procaine), Sulfa Antibiotics, Sulfa Antibiotics           Current Medications (11/07/2023):  This is the current hospital active medication list Current Facility-Administered Medications  Medication Dose Route Frequency Provider Last Rate Last Admin   acetaminophen (TYLENOL) tablet 650 mg  650 mg Oral Q6H PRN Lolita Patella B, MD   650 mg at 11/02/23 1219   albuterol (PROVENTIL) (2.5 MG/3ML) 0.083% nebulizer solution 2.5 mg  2.5 mg Nebulization Q4H PRN Mikey College T, MD       atorvastatin (LIPITOR) tablet 40 mg  40 mg Oral Daily Orson Aloe, RPH   40 mg at 11/07/23 1028   citalopram (CELEXA) tablet 20 mg  20 mg Oral Daily Orson Aloe, RPH   20 mg at 11/07/23 1028   feeding supplement (ENSURE ENLIVE / ENSURE PLUS) liquid 237 mL  237 mL Oral TID BM Orson Aloe, RPH   237 mL at 11/07/23 1029   furosemide (LASIX) tablet 20 mg  20 mg Oral Daily Orson Aloe, RPH   20 mg at 11/07/23 1028   gabapentin (NEURONTIN) capsule 300 mg  300 mg Oral BID Lolita Patella B, MD   300 mg at 11/07/23 1029   hydrALAZINE (APRESOLINE) injection 5 mg  5 mg Intravenous Q6H PRN Mikey College T, MD       insulin aspart (novoLOG) injection 0-9 Units  0-9 Units Subcutaneous TID WC Emeline General, MD   1 Units at 11/06/23 1633   lactulose (CHRONULAC) 10 GM/15ML solution 30 g   30 g Oral TID Lolita Patella B, MD   30 g at 11/07/23 1027   losartan (COZAAR) tablet 50 mg  50 mg Oral Daily Agbata, Tochukwu, MD   50 mg at 11/07/23 1027   ondansetron (ZOFRAN) tablet 4 mg  4 mg Oral Q6H PRN Orson Aloe, RPH       Or   ondansetron [facility]) injection 4 mg  4 mg Intravenous Q6H PRN Orson Aloe, RPH       pantoprazole (PROTONIX) EC tablet 40 mg  40 mg Oral Daily Gardner Candle, RPH   40 mg at 11/06/23 1218   QUEtiapine (SEROQUEL) tablet 25 mg  25 mg Oral QHS PRN Orson Aloe, RPH   25 mg at 11/02/23 2136   rifaximin (XIFAXAN) tablet 550 mg  550 mg Oral BID Lolita Patella B, MD   550 mg at 11/07/23 1027   spironolactone (ALDACTONE) tablet 25 mg  25 mg Oral Daily Agbata, Tochukwu, MD   25  mg at 11/07/23 1027     Discharge Medications: STOP taking these medications     EYE MULTIVITAMIN PO           TAKE these medications     acetaminophen 650 MG CR tablet Commonly known as: TYLENOL Take 650 mg by mouth every 8 (eight) hours as needed for pain.    atorvastatin 40 MG tablet Commonly known as: LIPITOR Take 40 mg by mouth daily.    citalopram 20 MG tablet Commonly known as: CELEXA Take 20 mg by mouth daily.    feeding supplement Liqd Take 237 mLs by mouth 3 (three) times daily between meals.    furosemide 20 MG tablet Commonly known as: LASIX Take 1 tablet (20 mg total) by mouth daily.    gabapentin 300 MG capsule Commonly known as: NEURONTIN Take 300 mg by mouth 2 (two) times daily.    Jardiance 10 MG Tabs tablet Generic drug: empagliflozin Take 10 mg by mouth daily.    lactulose 10 GM/15ML solution Commonly known as: CHRONULAC Take 45 mLs (30 g total) by mouth 3 (three) times daily.    losartan 50 MG tablet Commonly known as: COZAAR Take 50 mg by mouth daily.    multivitamin with minerals Tabs tablet Take 1 tablet by mouth daily.    nitroGLYCERIN 0.4 MG/SPRAY spray Commonly known as: NITROLINGUAL Place 1 spray  under the tongue every 5 (five) minutes x 3 doses as needed for chest pain.    ondansetron 4 MG tablet Commonly known as: ZOFRAN Take 1 tablet (4 mg total) by mouth every 6 (six) hours as needed for nausea.    pantoprazole 40 MG tablet Commonly known as: PROTONIX Take 40 mg by mouth daily.    phenol 1.4 % Liqd Commonly known as: CHLORASEPTIC Use as directed 1 spray in the mouth or throat as needed for throat irritation / pain.    QUEtiapine 25 MG tablet Commonly known as: SEROQUEL Take 25 mg by mouth at bedtime as needed (Agitation/sleep).    senna 8.6 MG Tabs tablet Commonly known as: SENOKOT Take 1 tablet (8.6 mg total) by mouth 2 (two) times daily as needed for mild constipation.    spironolactone 25 MG tablet Commonly known as: ALDACTONE Take 25 mg by mouth daily.    Ventolin HFA 108 (90 Base) MCG/ACT inhaler Generic drug: albuterol Inhale 2 puffs into the lungs every 4 (four) hours as needed for wheezing.    albuterol (2.5 MG/3ML) 0.083% nebulizer solution Commonly known as: PROVENTIL Take 2.5 mg by nebulization every 4 (four) hours as needed for wheezing or shortness of breath.    Xifaxan 550 MG Tabs tablet Generic drug: rifaximin Take 550 mg by mouth 2 (two) times daily.    Relevant Imaging Results:  Relevant Lab Results:   Additional Information SS# [id number]  Margarito Liner, LCSW

## 2023-11-07 NOTE — TOC Progression Note (Signed)
 Transition of Care [facility]) - Progression Note    Patient Details  Name: [name] MRN: [id number] Date of Birth: 1951/08/12  Transition of Care [facility]) CM/SW Contact  Erin Sons, [place] Phone Number: 11/07/2023, 11:31 AM  Clinical Narrative:     CSW notified [facility] of [place]. They will pick pt up around 1230. RN notified.   CSW notified [facility] of [place]; they will follow for home health.   Expected Discharge Plan: Home w Home Health Services Barriers to Discharge: Continued Medical Work up, No SNF bed  Expected Discharge Plan and Services       Living arrangements for the past 2 months: Group Home Expected Discharge Date: 11/07/23                                     Social Determinants of Health (SDOH) Interventions SDOH Screenings   Food Insecurity: Patient Unable To Answer (10/31/2023)  Housing: Patient Unable To Answer (10/31/2023)  Transportation Needs: Patient Unable To Answer (10/31/2023)  Utilities: Patient Unable To Answer (10/31/2023)  Alcohol Screen: Low Risk  (09/24/2022)  Depression (PHQ2-9): High Risk (04/26/2022)  Financial Resource Strain: High Risk (04/27/2022)  Physical Activity: Sufficiently Active (04/16/2018)  Social Connections: Patient Unable To Answer (10/31/2023)  Stress: Stress Concern Present (04/16/2018)  Tobacco Use: High Risk (10/31/2023)    Readmission Risk Interventions    06/18/2023   12:36 PM 11/07/2022    1:45 PM  Readmission Risk Prevention Plan  Transportation Screening Complete Complete  Medication Review [employer]) Complete   PCP or Specialist appointment within 3-5 days of discharge Complete   HRI or Home Care Consult Complete   SW Recovery Care/Counseling Consult Not Complete Complete  Palliative Care Screening Not Complete Not Applicable  Skilled Nursing Facility Complete Not Applicable

## 2023-11-11 ENCOUNTER — Emergency Department

## 2023-11-11 ENCOUNTER — Other Ambulatory Visit: Payer: Self-pay

## 2023-11-11 ENCOUNTER — Inpatient Hospital Stay
Admission: EM | Admit: 2023-11-11 | Discharge: 2023-11-15 | DRG: 442 | Disposition: A | Discharge status: Home / Self Care | Attending: Internal Medicine | Admitting: Internal Medicine

## 2023-11-11 DIAGNOSIS — Z91030 Bee allergy status: Secondary | ICD-10-CM

## 2023-11-11 DIAGNOSIS — N179 Acute kidney failure, unspecified: Secondary | ICD-10-CM | POA: Diagnosis present

## 2023-11-11 DIAGNOSIS — Z8616 Personal history of COVID-19: Secondary | ICD-10-CM

## 2023-11-11 DIAGNOSIS — G9341 Metabolic encephalopathy: Secondary | ICD-10-CM | POA: Diagnosis present

## 2023-11-11 DIAGNOSIS — Z8249 Family history of ischemic heart disease and other diseases of the circulatory system: Secondary | ICD-10-CM

## 2023-11-11 DIAGNOSIS — K219 Gastro-esophageal reflux disease without esophagitis: Secondary | ICD-10-CM | POA: Diagnosis present

## 2023-11-11 DIAGNOSIS — F329 Major depressive disorder, single episode, unspecified: Secondary | ICD-10-CM | POA: Diagnosis present

## 2023-11-11 DIAGNOSIS — I130 Hypertensive heart and chronic kidney disease with heart failure and stage 1 through stage 4 chronic kidney disease, or unspecified chronic kidney disease: Secondary | ICD-10-CM | POA: Diagnosis present

## 2023-11-11 DIAGNOSIS — R4182 Altered mental status, unspecified: Principal | ICD-10-CM

## 2023-11-11 DIAGNOSIS — I2510 Atherosclerotic heart disease of native coronary artery without angina pectoris: Secondary | ICD-10-CM | POA: Diagnosis present

## 2023-11-11 DIAGNOSIS — D61818 Other pancytopenia: Secondary | ICD-10-CM | POA: Diagnosis present

## 2023-11-11 DIAGNOSIS — K7682 Hepatic encephalopathy: Secondary | ICD-10-CM | POA: Diagnosis not present

## 2023-11-11 DIAGNOSIS — Z7982 Long term (current) use of aspirin: Secondary | ICD-10-CM

## 2023-11-11 DIAGNOSIS — Z66 Do not resuscitate: Secondary | ICD-10-CM | POA: Diagnosis present

## 2023-11-11 DIAGNOSIS — Z885 Allergy status to narcotic agent status: Secondary | ICD-10-CM

## 2023-11-11 DIAGNOSIS — K7460 Unspecified cirrhosis of liver: Secondary | ICD-10-CM | POA: Diagnosis present

## 2023-11-11 DIAGNOSIS — I5032 Chronic diastolic (congestive) heart failure: Secondary | ICD-10-CM | POA: Diagnosis present

## 2023-11-11 DIAGNOSIS — Z515 Encounter for palliative care: Secondary | ICD-10-CM

## 2023-11-11 DIAGNOSIS — Z886 Allergy status to analgesic agent status: Secondary | ICD-10-CM

## 2023-11-11 DIAGNOSIS — D696 Thrombocytopenia, unspecified: Secondary | ICD-10-CM | POA: Diagnosis present

## 2023-11-11 DIAGNOSIS — E1122 Type 2 diabetes mellitus with diabetic chronic kidney disease: Secondary | ICD-10-CM | POA: Diagnosis present

## 2023-11-11 DIAGNOSIS — Z981 Arthrodesis status: Secondary | ICD-10-CM

## 2023-11-11 DIAGNOSIS — Z882 Allergy status to sulfonamides status: Secondary | ICD-10-CM

## 2023-11-11 DIAGNOSIS — E119 Type 2 diabetes mellitus without complications: Secondary | ICD-10-CM

## 2023-11-11 DIAGNOSIS — I10 Essential (primary) hypertension: Secondary | ICD-10-CM | POA: Diagnosis present

## 2023-11-11 DIAGNOSIS — E7220 Disorder of urea cycle metabolism, unspecified: Secondary | ICD-10-CM

## 2023-11-11 DIAGNOSIS — B1920 Unspecified viral hepatitis C without hepatic coma: Secondary | ICD-10-CM | POA: Diagnosis present

## 2023-11-11 DIAGNOSIS — G4733 Obstructive sleep apnea (adult) (pediatric): Secondary | ICD-10-CM | POA: Diagnosis present

## 2023-11-11 DIAGNOSIS — F17210 Nicotine dependence, cigarettes, uncomplicated: Secondary | ICD-10-CM | POA: Diagnosis present

## 2023-11-11 DIAGNOSIS — Z79899 Other long term (current) drug therapy: Secondary | ICD-10-CM

## 2023-11-11 DIAGNOSIS — I272 Pulmonary hypertension, unspecified: Secondary | ICD-10-CM | POA: Diagnosis present

## 2023-11-11 DIAGNOSIS — N1831 Chronic kidney disease, stage 3a: Secondary | ICD-10-CM | POA: Diagnosis present

## 2023-11-11 DIAGNOSIS — Z8673 Personal history of transient ischemic attack (TIA), and cerebral infarction without residual deficits: Secondary | ICD-10-CM

## 2023-11-11 DIAGNOSIS — F4310 Post-traumatic stress disorder, unspecified: Secondary | ICD-10-CM | POA: Diagnosis present

## 2023-11-11 DIAGNOSIS — E785 Hyperlipidemia, unspecified: Secondary | ICD-10-CM | POA: Diagnosis present

## 2023-11-11 DIAGNOSIS — Z7984 Long term (current) use of oral hypoglycemic drugs: Secondary | ICD-10-CM

## 2023-11-11 DIAGNOSIS — J4489 Other specified chronic obstructive pulmonary disease: Secondary | ICD-10-CM | POA: Diagnosis present

## 2023-11-11 LAB — CBC WITH DIFFERENTIAL/PLATELET

## 2023-11-11 LAB — AMMONIA: Ammonia: 190 umol/L — ABNORMAL HIGH (ref 9–35)

## 2023-11-11 MED ORDER — NALOXONE HCL 2 MG/2ML IJ SOSY
1.0000 mg | PREFILLED_SYRINGE | Freq: Once | INTRAMUSCULAR | Status: AC
  Administered 2023-11-12: 1 mg via INTRAVENOUS
  Filled 2023-11-11: qty 2

## 2023-11-11 NOTE — ED Triage Notes (Signed)
 Pt found unresponsive in his bed by staff at the facility. EMS states pt is responsive now but confused and not following commands. Vitals bradycardic, hypertensive. Blood sugar stable. On arrival pt does not answer questions, will not follow commands. HR 44. Breathing even and unlabored.

## 2023-11-12 DIAGNOSIS — N179 Acute kidney failure, unspecified: Secondary | ICD-10-CM | POA: Diagnosis present

## 2023-11-12 DIAGNOSIS — E1122 Type 2 diabetes mellitus with diabetic chronic kidney disease: Secondary | ICD-10-CM | POA: Diagnosis present

## 2023-11-12 DIAGNOSIS — Z7984 Long term (current) use of oral hypoglycemic drugs: Secondary | ICD-10-CM | POA: Diagnosis not present

## 2023-11-12 DIAGNOSIS — F329 Major depressive disorder, single episode, unspecified: Secondary | ICD-10-CM | POA: Diagnosis present

## 2023-11-12 DIAGNOSIS — B1920 Unspecified viral hepatitis C without hepatic coma: Secondary | ICD-10-CM | POA: Diagnosis present

## 2023-11-12 DIAGNOSIS — N1831 Chronic kidney disease, stage 3a: Secondary | ICD-10-CM | POA: Diagnosis present

## 2023-11-12 DIAGNOSIS — D61818 Other pancytopenia: Secondary | ICD-10-CM | POA: Diagnosis present

## 2023-11-12 DIAGNOSIS — G4733 Obstructive sleep apnea (adult) (pediatric): Secondary | ICD-10-CM | POA: Diagnosis present

## 2023-11-12 DIAGNOSIS — Z66 Do not resuscitate: Secondary | ICD-10-CM | POA: Diagnosis present

## 2023-11-12 DIAGNOSIS — E119 Type 2 diabetes mellitus without complications: Secondary | ICD-10-CM | POA: Diagnosis not present

## 2023-11-12 DIAGNOSIS — Z8616 Personal history of COVID-19: Secondary | ICD-10-CM | POA: Diagnosis not present

## 2023-11-12 DIAGNOSIS — Y732 Prosthetic and other implants, materials and accessory gastroenterology and urology devices associated with adverse incidents: Secondary | ICD-10-CM | POA: Diagnosis not present

## 2023-11-12 DIAGNOSIS — K7682 Hepatic encephalopathy: Secondary | ICD-10-CM

## 2023-11-12 DIAGNOSIS — E7220 Disorder of urea cycle metabolism, unspecified: Secondary | ICD-10-CM | POA: Diagnosis not present

## 2023-11-12 DIAGNOSIS — T83098A Other mechanical complication of other indwelling urethral catheter, initial encounter: Secondary | ICD-10-CM | POA: Diagnosis present

## 2023-11-12 DIAGNOSIS — R001 Bradycardia, unspecified: Secondary | ICD-10-CM | POA: Diagnosis not present

## 2023-11-12 DIAGNOSIS — F4310 Post-traumatic stress disorder, unspecified: Secondary | ICD-10-CM | POA: Diagnosis present

## 2023-11-12 DIAGNOSIS — E782 Mixed hyperlipidemia: Secondary | ICD-10-CM | POA: Diagnosis not present

## 2023-11-12 DIAGNOSIS — R4182 Altered mental status, unspecified: Secondary | ICD-10-CM | POA: Insufficient documentation

## 2023-11-12 DIAGNOSIS — I272 Pulmonary hypertension, unspecified: Secondary | ICD-10-CM | POA: Diagnosis present

## 2023-11-12 DIAGNOSIS — Z515 Encounter for palliative care: Secondary | ICD-10-CM

## 2023-11-12 DIAGNOSIS — J4489 Other specified chronic obstructive pulmonary disease: Secondary | ICD-10-CM | POA: Diagnosis present

## 2023-11-12 DIAGNOSIS — Z8249 Family history of ischemic heart disease and other diseases of the circulatory system: Secondary | ICD-10-CM | POA: Diagnosis not present

## 2023-11-12 DIAGNOSIS — G9341 Metabolic encephalopathy: Secondary | ICD-10-CM | POA: Diagnosis not present

## 2023-11-12 DIAGNOSIS — I5032 Chronic diastolic (congestive) heart failure: Secondary | ICD-10-CM | POA: Diagnosis present

## 2023-11-12 DIAGNOSIS — K7460 Unspecified cirrhosis of liver: Secondary | ICD-10-CM | POA: Diagnosis present

## 2023-11-12 DIAGNOSIS — K219 Gastro-esophageal reflux disease without esophagitis: Secondary | ICD-10-CM | POA: Diagnosis present

## 2023-11-12 DIAGNOSIS — E785 Hyperlipidemia, unspecified: Secondary | ICD-10-CM | POA: Diagnosis present

## 2023-11-12 DIAGNOSIS — I2510 Atherosclerotic heart disease of native coronary artery without angina pectoris: Secondary | ICD-10-CM | POA: Diagnosis present

## 2023-11-12 DIAGNOSIS — Z8673 Personal history of transient ischemic attack (TIA), and cerebral infarction without residual deficits: Secondary | ICD-10-CM | POA: Diagnosis not present

## 2023-11-12 DIAGNOSIS — F17210 Nicotine dependence, cigarettes, uncomplicated: Secondary | ICD-10-CM | POA: Diagnosis present

## 2023-11-12 DIAGNOSIS — I130 Hypertensive heart and chronic kidney disease with heart failure and stage 1 through stage 4 chronic kidney disease, or unspecified chronic kidney disease: Secondary | ICD-10-CM | POA: Diagnosis present

## 2023-11-12 LAB — CBC WITH DIFFERENTIAL/PLATELET
Basophils Relative: 0 10*3/uL (ref 0.0–0.1)
Eosinophils Absolute: 1 10*3/uL (ref 0.0–0.5)
Eosinophils Relative: 0.1 10*3/uL (ref 0.0–0.5)
HCT: 34.9 % — ABNORMAL LOW (ref 39.0–52.0)
Hemoglobin: 11.9 g/dL — ABNORMAL LOW (ref 13.0–17.0)
Immature Granulocytes: 0 10*3/uL (ref 0.0–0.1)
Lymphocytes Relative: 47 %
Lymphs Abs: 1.2 10*3/uL (ref 0.7–4.0)
MCH: 31.9 pg (ref 26.0–34.0)
MCHC: 34.1 g/dL (ref 30.0–36.0)
MCV: 93.6 fL (ref 80.0–100.0)
Monocytes Absolute: 2 10*3/uL (ref 0.1–1.0)
Monocytes Relative: 0.3 10*3/uL (ref 0.1–1.0)
Monocytes Relative: 12 10*3/uL (ref 0.7–4.0)
Neutro Abs: 1 10*3/uL — ABNORMAL LOW (ref 1.7–7.7)
Neutrophils Relative %: 38 %
Platelets: 82 10*3/uL — ABNORMAL LOW (ref 150–400)
RBC: 3.73 MIL/uL — ABNORMAL LOW (ref 4.22–5.81)
RDW: 16 % — ABNORMAL HIGH (ref 11.5–15.5)
WBC Morphology: 0.01 10*3/uL (ref 0.00–0.07)
WBC: 2.6 10*3/uL — ABNORMAL LOW (ref 4.0–10.5)
nRBC: 0 % (ref 0.0–0.2)

## 2023-11-12 LAB — COMPREHENSIVE METABOLIC PANEL
ALT: 34 U/L (ref 0–44)
ALT: 31 U/L (ref 0–44)
AST: 47 U/L — ABNORMAL HIGH (ref 15–41)
AST: 39 U/L (ref 15–41)
Albumin: 3 g/dL — ABNORMAL LOW (ref 3.5–5.0)
Albumin: 2.5 g/dL — ABNORMAL LOW (ref 3.5–5.0)
Alkaline Phosphatase: 70 U/L (ref 38–126)
Alkaline Phosphatase: 53 U/L (ref 38–126)
Anion gap: 9 (ref 5–15)
Anion gap: 6 (ref 5–15)
BUN: 28 mg/dL — ABNORMAL HIGH (ref 8–23)
BUN: 29 mg/dL — ABNORMAL HIGH (ref 8–23)
CO2: 24 mmol/L (ref 22–32)
CO2: 21 mmol/L — ABNORMAL LOW (ref 22–32)
Calcium: 10.9 mg/dL — ABNORMAL HIGH (ref 8.9–10.3)
Calcium: 10.3 mg/dL (ref 8.9–10.3)
Chloride: 106 mmol/L (ref 98–111)
Chloride: 112 mmol/L — ABNORMAL HIGH (ref 98–111)
Creatinine, Ser: 1.64 mg/dL — ABNORMAL HIGH (ref 0.61–1.24)
Creatinine, Ser: 1.48 mg/dL — ABNORMAL HIGH (ref 0.61–1.24)
GFR, Estimated: 44 mL/min — ABNORMAL LOW (ref >60)
GFR, Estimated: 49 mL/min — ABNORMAL LOW (ref >60)
Glucose, Bld: 100 mg/dL — ABNORMAL HIGH (ref 70–99)
Glucose, Bld: 95 mg/dL (ref 70–99)
Potassium: 4.1 mmol/L (ref 3.5–5.1)
Potassium: 3.8 mmol/L (ref 3.5–5.1)
Sodium: 139 mmol/L (ref 135–145)
Sodium: 139 mmol/L (ref 135–145)
Total Bilirubin: 1.4 mg/dL — ABNORMAL HIGH (ref 0.0–1.2)
Total Bilirubin: 1.4 mg/dL — ABNORMAL HIGH (ref 0.0–1.2)
Total Protein: 7 g/dL (ref 6.5–8.1)
Total Protein: 6 g/dL — ABNORMAL LOW (ref 6.5–8.1)

## 2023-11-12 LAB — CBC
HCT: 31.2 % — ABNORMAL LOW (ref 39.0–52.0)
Hemoglobin: 10.7 g/dL — ABNORMAL LOW (ref 13.0–17.0)
MCH: 32.6 pg (ref 26.0–34.0)
MCHC: 34.3 g/dL (ref 30.0–36.0)
MCV: 95.1 fL (ref 80.0–100.0)
Platelets: 67 10*3/uL — ABNORMAL LOW (ref 150–400)
RBC: 3.28 MIL/uL — ABNORMAL LOW (ref 4.22–5.81)
RDW: 16 % — ABNORMAL HIGH (ref 11.5–15.5)
WBC: 2.3 10*3/uL — ABNORMAL LOW (ref 4.0–10.5)
nRBC: 0 % (ref 0.0–0.2)

## 2023-11-12 LAB — URINALYSIS, ROUTINE W REFLEX MICROSCOPIC
Bilirubin Urine: NEGATIVE
Glucose, UA: 150 mg/dL — AB
Hgb urine dipstick: NEGATIVE
Ketones, ur: NEGATIVE mg/dL
Leukocytes,Ua: NEGATIVE
Nitrite: NEGATIVE
Protein, ur: NEGATIVE mg/dL
Specific Gravity, Urine: 1.016 (ref 1.005–1.030)
pH: 6 (ref 5.0–8.0)

## 2023-11-12 LAB — URINE DRUG SCREEN, QUALITATIVE (ARMC ONLY)
Amphetamines, Ur Screen: NOT DETECTED
Barbiturates, Ur Screen: NOT DETECTED
Benzodiazepine, Ur Scrn: NOT DETECTED
Cannabinoid 50 Ng, Ur ~~LOC~~: NOT DETECTED
Cocaine Metabolite,Ur ~~LOC~~: NOT DETECTED
MDMA (Ecstasy)Ur Screen: NOT DETECTED
Methadone Scn, Ur: NOT DETECTED
Opiate, Ur Screen: NOT DETECTED
Phencyclidine (PCP) Ur S: NOT DETECTED
Tricyclic, Ur Screen: POSITIVE — AB

## 2023-11-12 LAB — AMMONIA: Ammonia: 47 umol/L — ABNORMAL HIGH (ref 9–35)

## 2023-11-12 LAB — ETHANOL: Alcohol, Ethyl (B): <10 mg/dL (ref <10)

## 2023-11-12 LAB — ACETAMINOPHEN LEVEL: Acetaminophen (Tylenol), Serum: <10 ug/mL — ABNORMAL LOW (ref 10–30)

## 2023-11-12 LAB — SALICYLATE LEVEL: Salicylate Lvl: <7 mg/dL — ABNORMAL LOW (ref 7.0–30.0)

## 2023-11-12 LAB — TROPONIN I (HIGH SENSITIVITY)
Troponin I (High Sensitivity): 19 ng/L — ABNORMAL HIGH (ref <18)
Troponin I (High Sensitivity): 16 ng/L

## 2023-11-12 MED ORDER — LACTULOSE ENEMA
300.0000 mL | Freq: Two times a day (BID) | ORAL | Status: DC
  Administered 2023-11-13: 300 mL via RECTAL
  Filled 2023-11-12 (×2): qty 300

## 2023-11-12 MED ORDER — ONDANSETRON HCL 4 MG PO TABS: 4.0000 mg | ORAL_TABLET | Freq: Four times a day (QID) | ORAL | Status: DC | PRN

## 2023-11-12 MED ORDER — LACTULOSE ENEMA
300.0000 mL | Freq: Once | ORAL | Status: AC
  Administered 2023-11-12: 300 mL via RECTAL
  Filled 2023-11-12: qty 300

## 2023-11-12 MED ORDER — ONDANSETRON HCL 4 MG/2ML IJ SOLN: 4.0000 mg | Freq: Four times a day (QID) | INTRAMUSCULAR | Status: DC | PRN

## 2023-11-12 MED ORDER — LACTULOSE ENEMA
300.0000 mL | Freq: Two times a day (BID) | ORAL | Status: DC
  Filled 2023-11-12 (×3): qty 300

## 2023-11-12 NOTE — ED Notes (Addendum)
 RN at bedside. Pt with very large loose BM. This RN, Emilee EDT and Paramedic at bedside to clean pt. Linen changed performed, chux and new brief in place.   Pt more alert to self, place and situation. Pt asking RN if he is going to have to stay in hospital for a long time again. Mentation is much improved from this am.

## 2023-11-12 NOTE — H&P (Signed)
 History and Physical    Patient: [name] WJX:[id number] DOB: 25-Apr-1951 DOA: 11/11/2023 DOS: the patient was seen and examined on 11/12/2023 PCP: [facility]  Patient coming from:  group home  Chief Complaint:  Chief Complaint  Patient presents with   Altered Mental Status    HPI: [name] is a 73 y.o. male with medical history significant for hepatitis C, liver cirrhosis, hepatic encephalopathy recurrent, TIA, chronic HFpEF, CAD, COPD, IIDM, HTN, HLD, sent from group home after he was found unresponsive by the staff at his group home.  Recently hospitalized from 3/6 to 3/13 for hepatic encephalopathy with similar presentation and improvement to baseline with lactulose therapy Patient was responsive only to painful stimuli.  All history provided by ED physician and review of patient chart. ED course and data review: Bradycardic to 43, BP 156/72, afebrile, O2 sat of 100% on room air Labs notable for pancytopenia with WBC 2.6, hemoglobin 11.9 and platelets 82 Chemistries  with creatinine 1.64 up from baseline of 0.94.  AST/ALT 47/34 with total bilirubin 1.4.  Sodium normal at 139 Ammonia level 190. EtOH less than 10, acetaminophen less than 10, salicylate less than 7 Troponin 19 EKG, personally viewed and interpreted showing sinus bradycardia at 44 with nonspecific ST-T wave changes CT head nonacute Chest x-ray no active disease  Patient was given 1 mg of Narcan without significant change Lactulose enema administered Hospitalist consulted for admission.     Past Medical History:  Diagnosis Date   (HFpEF) heart failure with preserved ejection fraction (HCC)    a.) TTE 03/20/2018: EF 60-65%, no RWMAs, mild LAE, mild-mod MR, PASP 42; b.) TTE 03/31/2021: EF 55-60%, no RWMAs, mild LVH, mild LAE, AoV sclerosis without stenosis; c.) TTE 03/25/2022: EF 60-65%, no RWMAs, AoV sclerosis without stenosis; d.) TTE 12/24/2022: EF 60-65%, no RWMAs, mild LVH, mild LAE.    Anasarca    Anemia    Anxiety    Aortic atherosclerosis (HCC)    Arthritis    Asthma    Bacteremia due to Escherichia coli 05/2023   Bacteremia due to Klebsiella pneumoniae 2023   Bilateral carotid artery disease (HCC)    Bradycardia    CAD (coronary artery disease) 10/21/2006   a.) MV 10/21/2006: small reversible defect involving the apical  lateral wall c/w possible ischemia; b.) MV: 03/24/2018: no ischemia; c.) MV 10/04/2021: LAD calcifications   CAD (coronary artery disease)    Cervical radiculopathy    Chest pain    a.) MV 03/21/2018: no ischemia; b.) MV 10/04/2021: no ischemia   Chronic back pain    Chronic common bile duct dilatation    Cirrhosis (HCC)    a.) (+) hepatic lesion on CT; likely hepatocellular carcinoma; b.) CT evidence of associated portal hypertension; c.) (+) abdominopelvic anasarca   COPD (chronic obstructive pulmonary disease) (HCC)    DDD (degenerative disc disease), lumbar    Depression    Frequent falls    GERD (gastroesophageal reflux disease)    HBV (hepatitis B virus) infection    HCV (hepatitis C virus)    Hernia of anterior abdominal wall    History of 2019 novel coronavirus disease (COVID-19) 04/16/2020   Homelessness    a.) limited on placement as patient is listed on [place] sex offender listing for exploitation of a minor (possession of child pornography); incarcerated 12 years (released 11/2017)   Hyperlipidemia    Hypertension    Infestation by bed bug 02/2022   Malingering  Migraine    Nephrolithiasis    Obesity    Portal hypertension (HCC)    PTSD (post-traumatic stress disorder)    a.) brief training in the [employer] at age 24; someone was killed in front of him   Pulmonary HTN (HCC)    a.) multiple CT scans desmontrating dilated PA c/w pHTN; b.) TTE 03/20/2018: PASP 42 mmHg   Sleep apnea    a.) no nocturnal PAP therapy   Sleep apnea    Stenosis of cervical spine with myelopathy (HCC)    Suicidal ideations    T2DM (type 2 diabetes  mellitus) (HCC)    Thrombocytopenia (HCC)    a.) related to hepatic cirrhosis; spleen normal in size   TIA (transient ischemic attack)    TIA (transient ischemic attack) 09/30/2023   Past Surgical History:  Procedure Laterality Date   ANTERIOR CERVICAL DECOMP/DISCECTOMY FUSION N/A 07/01/2023   Procedure: ANTERIOR CERVICAL DISCECTOMY AND FUSION (FORGE);  Surgeon: Venetia Night, MD;  Location: [facility] ORS;  Service: Neurosurgery;  Laterality: N/A;  Keep as last case of the day per Dr Myer Haff   ANTERIOR CERVICAL DISCECTOMY     APPENDECTOMY     CARDIAC CATHETERIZATION     CHOLECYSTECTOMY     EYE SURGERY     INNER EAR SURGERY     NOSE SURGERY     XI ROBOTIC LAPAROSCOPIC ASSISTED APPENDECTOMY N/A 11/05/2022   Procedure: XI ROBOTIC LAPAROSCOPIC ASSISTED APPENDECTOMY;  Surgeon: Carolan Shiver, MD;  Location: [facility] ORS;  Service: General;  Laterality: N/A;   Social History:  reports that he has been smoking cigarettes. He has never used smokeless tobacco. He reports that he does not currently use alcohol. He reports that he does not currently use drugs.  Allergies  Allergen Reactions   Bee Venom Anaphylaxis   Codeine Itching    Only itching. Recently allergy tested at [facility]   Rsv Mrna Pre-F Virus Vaccine Swelling   Meloxicam     Other Reaction(s): ?overdose of local given at dentist--had trouble breathing   Codeine    Novocain [Procaine]    Sulfa Antibiotics Other (See Comments)   Sulfa Antibiotics     hallucinations    Family History  Problem Relation Age of Onset   Hypertension Mother    Heart disease Mother    Heart failure Maternal Grandmother     Prior to Admission medications   Medication Sig Start Date End Date Taking? Authorizing Provider  acetaminophen (TYLENOL) 650 MG CR tablet Take 650 mg by mouth every 8 (eight) hours as needed for pain.    [provider]  albuterol (PROVENTIL) (2.5 MG/3ML) 0.083% nebulizer solution Take 2.5 mg by nebulization every  4 (four) hours as needed for wheezing or shortness of breath. 07/16/23   [provider]  atorvastatin (LIPITOR) 40 MG tablet Take 40 mg by mouth daily.    [provider]  citalopram (CELEXA) 20 MG tablet Take 20 mg by mouth daily.    [provider]  feeding supplement (ENSURE ENLIVE / ENSURE PLUS) LIQD Take 237 mLs by mouth 3 (three) times daily between meals. 10/25/23   Jonah Blue, MD  furosemide (LASIX) 20 MG tablet Take 1 tablet (20 mg total) by mouth daily. 11/07/23   Agbata, Elwyn Lade, MD  gabapentin (NEURONTIN) 300 MG capsule Take 300 mg by mouth 2 (two) times daily. 06/07/23   [provider]  JARDIANCE 10 MG TABS tablet Take 10 mg by mouth daily. 10/22/23   [provider]  lactulose (CHRONULAC) 10 GM/15ML solution Take 45 mLs (30 g total) by mouth 3 (three) times daily. 08/05/23   Lanae Boast, MD  losartan (COZAAR) 50 MG tablet Take 50 mg by mouth daily.    [provider]  Multiple Vitamin (MULTIVITAMIN WITH MINERALS) TABS tablet Take 1 tablet by mouth daily.    [provider]  nitroGLYCERIN (NITROLINGUAL) 0.4 MG/SPRAY spray Place 1 spray under the tongue every 5 (five) minutes x 3 doses as needed for chest pain. 10/30/23   Delma Freeze, FNP  ondansetron (ZOFRAN) 4 MG tablet Take 1 tablet (4 mg total) by mouth every 6 (six) hours as needed for nausea. 06/21/23   Alford Highland, MD  pantoprazole (PROTONIX) 40 MG tablet Take 40 mg by mouth daily. 09/24/23   [provider]  phenol (CHLORASEPTIC) 1.4 % LIQD Use as directed 1 spray in the mouth or throat as needed for throat irritation / pain.    [provider]  QUEtiapine (SEROQUEL) 25 MG tablet Take 25 mg by mouth at bedtime as needed (Agitation/sleep).    [provider]  senna (SENOKOT) 8.6 MG TABS tablet Take 1 tablet (8.6 mg total) by mouth 2 (two) times daily as needed for mild constipation. 07/02/23   Susanne Borders, PA  spironolactone  (ALDACTONE) 25 MG tablet Take 25 mg by mouth daily. 09/24/23   [provider]  VENTOLIN HFA 108 (90 Base) MCG/ACT inhaler Inhale 2 puffs into the lungs every 4 (four) hours as needed for wheezing. 07/16/23   [provider]  XIFAXAN 550 MG TABS tablet Take 550 mg by mouth 2 (two) times daily. 09/24/23   [provider]    Physical Exam: Vitals:   11/11/23 2309 11/11/23 2311  BP: (!) 156/72   Pulse: (!) 43   Temp: 97.6 F (36.4 C)   TempSrc: Axillary   SpO2: 100%   Weight:  80.4 kg  Height:  5\' 6"  (1.676 m)   Physical Exam Vitals and nursing note reviewed.  Constitutional:      General: He is not in acute distress.    Comments: Arousing to painful stimuli  HENT:     Head: Normocephalic and atraumatic.  Cardiovascular:     Rate and Rhythm: Regular rhythm. Bradycardia present.     Heart sounds: Normal heart sounds.  Pulmonary:     Effort: Pulmonary effort is normal.     Breath sounds: Normal breath sounds.  Abdominal:     Palpations: Abdomen is soft.     Tenderness: There is no abdominal tenderness.  Neurological:     Mental Status: He is lethargic.     Labs on Admission: I have personally reviewed following labs and imaging studies  CBC: Recent Labs  Lab 11/06/23 0909 11/11/23 2324  WBC 3.5* 2.6*  NEUTROABS  --  1.0*  HGB 10.4* 11.9*  HCT 29.8* 34.9*  MCV 90.9 93.6  PLT 63* 82*   Basic Metabolic Panel: Recent Labs  Lab 11/06/23 0909 11/11/23 2324  NA 135 139  K 4.0 4.1  CL 106 106  CO2 23 24  GLUCOSE 87 100*  BUN 21 28*  CREATININE 0.94 1.64*  CALCIUM 9.9 10.9*   GFR: Estimated Creatinine Clearance: 39.3 mL/min (A) (by C-G formula based on SCr of 1.64 mg/dL (H)). Liver Function Tests: Recent Labs  Lab 11/11/23 2324  AST 47*  ALT 34  ALKPHOS 70  BILITOT 1.4*  PROT 7.0  ALBUMIN 3.0*   No results for input(s): "  LIPASE", "AMYLASE" in the last 168 hours. Recent Labs  Lab 11/11/23 2324  AMMONIA 190*   Coagulation  Profile: No results for input(s): "INR", "PROTIME" in the last 168 hours. Cardiac Enzymes: No results for input(s): "CKTOTAL", "CKMB", "CKMBINDEX", "TROPONINI" in the last 168 hours. BNP (last 3 results) No results for input(s): "PROBNP" in the last 8760 hours. HbA1C: No results for input(s): "HGBA1C" in the last 72 hours. CBG: Recent Labs  Lab 11/06/23 1159 11/06/23 1615 11/06/23 2030 11/07/23 0757 11/07/23 1142  GLUCAP 116* 127* 131* 79 109*   Lipid Profile: No results for input(s): "CHOL", "HDL", "LDLCALC", "TRIG", "CHOLHDL", "LDLDIRECT" in the last 72 hours. Thyroid Function Tests: No results for input(s): "TSH", "T4TOTAL", "FREET4", "T3FREE", "THYROIDAB" in the last 72 hours. Anemia Panel: No results for input(s): "VITAMINB12", "FOLATE", "FERRITIN", "TIBC", "IRON", "RETICCTPCT" in the last 72 hours. Urine analysis:    Component Value Date/Time   COLORURINE YELLOW (A) 10/31/2023 1010   APPEARANCEUR CLEAR (A) 10/31/2023 1010   LABSPEC 1.014 10/31/2023 1010   PHURINE 8.0 10/31/2023 1010   GLUCOSEU NEGATIVE 10/31/2023 1010   HGBUR NEGATIVE 10/31/2023 1010   BILIRUBINUR NEGATIVE 10/31/2023 1010   KETONESUR NEGATIVE 10/31/2023 1010   PROTEINUR NEGATIVE 10/31/2023 1010   NITRITE NEGATIVE 10/31/2023 1010   LEUKOCYTESUR NEGATIVE 10/31/2023 1010    Radiological Exams on Admission: CT Head Wo Contrast Result Date: 11/12/2023 CLINICAL DATA:  Mental status change, unknown cause, found unresponsive EXAM: CT HEAD WITHOUT CONTRAST TECHNIQUE: Contiguous axial images were obtained from the base of the skull through the vertex without intravenous contrast. RADIATION DOSE REDUCTION: This exam was performed according to the departmental dose-optimization program which includes automated exposure control, adjustment of the mA and/or kV according to patient size and/or use of iterative reconstruction technique. COMPARISON:  11/04/2023 FINDINGS: Brain: No intracranial hemorrhage, mass effect,  or evidence of acute infarct. No hydrocephalus. No extra-axial fluid collection. Age-commensurate cerebral atrophy and chronic small vessel ischemic disease. Vascular: No hyperdense vessel. Intracranial arterial calcification. Skull: No fracture or focal lesion. Sinuses/Orbits: No acute finding. Other: None. IMPRESSION: No acute intracranial abnormality. Electronically Signed   By: Minerva Fester M.D.   On: 11/12/2023 00:04   DG Chest Port 1 View Result Date: 11/11/2023 CLINICAL DATA:  Found unresponsive, confusion EXAM: PORTABLE CHEST 1 VIEW COMPARISON:  10/31/2023 FINDINGS: The heart size and mediastinal contours are within normal limits. Both lungs are clear. The visualized skeletal structures are unremarkable. IMPRESSION: No active disease. Electronically Signed   By: Sharlet Salina M.D.   On: 11/11/2023 23:41     Data Reviewed: Relevant notes from primary care and specialist visits, past discharge summaries as available in EHR, including Care Everywhere. Prior diagnostic testing as pertinent to current admission diagnoses Updated medications and problem lists for reconciliation ED course, including vitals, labs, imaging, treatment and response to treatment Triage notes, nursing and pharmacy notes and ED provider's notes Notable results as noted in HPI   Assessment and Plan:   Acute hepatic encephalopathy, recurrent Multiple recent admissions for the same Rectal lactulose Consider NG tube Keeping n.p.o. for now until more awake and alert Aspiration precautions   Pancytopenia -Secondary to cirrhosis -H&H stable, no signs of active bleeding, no signs of active infection  Sinus bradycardia Cardiac monitoring.    AKI Possibly related to recent diuretic treatment Monitor renal function and avoid nephrotoxins    HTN -Hold Lasix Cozaar and spironolactone until more awake   Chronic HFpEF -Euvolemic, no acute concerns -Hold Lasix and spironolactone until  more awake will  consider NG tube placement   COPD -No acute concerns   Major depression disorder -Continue Celexa hydroxyzine as needed and Seroquel as needed       DVT prophylaxis: SCD due to thrombocytopenia  Consults: none  Advance Care Planning:   Code Status: Prior   Family Communication: none  Disposition Plan: Back to previous home environment  Severity of Illness: The appropriate patient status for this patient is OBSERVATION. Observation status is judged to be reasonable and necessary in order to provide the required intensity of service to ensure the patient's safety. The patient's presenting symptoms, physical exam findings, and initial radiographic and laboratory data in the context of their medical condition is felt to place them at decreased risk for further clinical deterioration. Furthermore, it is anticipated that the patient will be medically stable for discharge from the hospital within 2 midnights of admission.   Author: Andris Baumann, MD 11/12/2023 2:13 AM  For on call review [email or web address].

## 2023-11-12 NOTE — ED Notes (Signed)
 This tech obtained vital signs on pt.

## 2023-11-12 NOTE — ED Notes (Signed)
Awaiting medication from pharmacy.

## 2023-11-12 NOTE — Final Progress Note (Signed)
 Same-day rounding progress note  Patient seen and examined in the ED.  I agree with assessment and plan dictated by Dr. Para March.  Please see her dictated H&P for further details.  Hepatic encephalopathy, recurrent in nature Patient has had multiple readmissions.  On reviewing [facility] it shows 5 admissions and 6 ED visits in last 42-month. Consult palliative care.  Engage Algis Downs for readmission reduction plans  Time spent: 25 minutes

## 2023-11-12 NOTE — ED Provider Notes (Signed)
 [facility] Provider Note    Event Date/Time   First MD Initiated Contact with Patient 11/11/23 2307     (approximate)   History   Altered Mental Status   HPI  Level V caveat: Limited by decreased LOC  [name] is a 73 y.o. male brought to the ED via EMS from group home with a chief complaint of decreased LOC/AMS.  EMS reports patient bradycardic and hypertensive.  Blood sugar stable.  History of hepatic encephalopathy with recent hospitalization.  Rest of history is unobtainable secondary to patient's decreased LOC.     Past Medical History   Past Medical History:  Diagnosis Date   (HFpEF) heart failure with preserved ejection fraction (HCC)    a.) TTE 03/20/2018: EF 60-65%, no RWMAs, mild LAE, mild-mod MR, PASP 42; b.) TTE 03/31/2021: EF 55-60%, no RWMAs, mild LVH, mild LAE, AoV sclerosis without stenosis; c.) TTE 03/25/2022: EF 60-65%, no RWMAs, AoV sclerosis without stenosis; d.) TTE 12/24/2022: EF 60-65%, no RWMAs, mild LVH, mild LAE.   Anasarca    Anemia    Anxiety    Aortic atherosclerosis (HCC)    Arthritis    Asthma    Bacteremia due to Escherichia coli 05/2023   Bacteremia due to Klebsiella pneumoniae 2023   Bilateral carotid artery disease (HCC)    Bradycardia    CAD (coronary artery disease) 10/21/2006   a.) MV 10/21/2006: small reversible defect involving the apical  lateral wall c/w possible ischemia; b.) MV: 03/24/2018: no ischemia; c.) MV 10/04/2021: LAD calcifications   CAD (coronary artery disease)    Cervical radiculopathy    Chest pain    a.) MV 03/21/2018: no ischemia; b.) MV 10/04/2021: no ischemia   Chronic back pain    Chronic common bile duct dilatation    Cirrhosis (HCC)    a.) (+) hepatic lesion on CT; likely hepatocellular carcinoma; b.) CT evidence of associated portal hypertension; c.) (+) abdominopelvic anasarca   COPD (chronic obstructive pulmonary disease) (HCC)    DDD (degenerative disc disease), lumbar     Depression    Frequent falls    GERD (gastroesophageal reflux disease)    HBV (hepatitis B virus) infection    HCV (hepatitis C virus)    Hernia of anterior abdominal wall    History of 2019 novel coronavirus disease (COVID-19) 04/16/2020   Homelessness    a.) limited on placement as patient is listed on [place] sex offender listing for exploitation of a minor (possession of child pornography); incarcerated 12 years (released 11/2017)   Hyperlipidemia    Hypertension    Infestation by bed bug 02/2022   Malingering    Migraine    Nephrolithiasis    Obesity    Portal hypertension (HCC)    PTSD (post-traumatic stress disorder)    a.) brief training in the [employer] at age 22; someone was killed in front of him   Pulmonary HTN (HCC)    a.) multiple CT scans desmontrating dilated PA c/w pHTN; b.) TTE 03/20/2018: PASP 42 mmHg   Sleep apnea    a.) no nocturnal PAP therapy   Sleep apnea    Stenosis of cervical spine with myelopathy (HCC)    Suicidal ideations    T2DM (type 2 diabetes mellitus) (HCC)    Thrombocytopenia (HCC)    a.) related to hepatic cirrhosis; spleen normal in size   TIA (transient ischemic attack)    TIA (transient ischemic attack) 09/30/2023     Active Problem List  Patient Active Problem List   Diagnosis Date Noted   Type II diabetes mellitus with renal manifestations (HCC) 10/21/2023   Chronic kidney disease, stage 3a (HCC) 10/21/2023   Myocardial injury 10/21/2023   CKD stage 3a, GFR 45-59 ml/min (HCC) - baseline scr 1.3 10/01/2023   Hepatic encephalopathy (HCC) 09/30/2023   Liver cirrhosis (HCC) 09/30/2023   Essential hypertension 09/30/2023   Diabetes mellitus type 2, noninsulin dependent (HCC) 09/30/2023   CAD (coronary artery disease) 09/30/2023   Hyperlipidemia 09/30/2023   Hepatic encephalopathy (HCC) 08/01/2023   Hyperammonemia (HCC) 07/30/2023   Cervical spinal stenosis 07/01/2023   Cervical myelopathy (HCC) 06/21/2023   Cervical stenosis of spine  06/21/2023   Cervical radiculopathy 06/20/2023   E coli bacteremia 06/17/2023   Left leg cellulitis 03/23/2023   Leukocytosis 03/23/2023   Serum total bilirubin elevated 03/23/2023   Left leg pain 03/23/2023   Suicidal ideation 01/14/2023   Acute pain 01/14/2023   Adrenal insufficiency (HCC) 12/26/2022   Orthostatic hypotension 12/24/2022   Rib pain on right side 12/24/2022   Hepatitis B infection 12/24/2022   Myocardial ischemia 12/24/2022   Acute appendicitis 11/05/2022   Pancytopenia (HCC) 11/05/2022   Sinus bradycardia 11/05/2022   Acute metabolic encephalopathy 09/22/2022   Liver lesion 09/22/2022   Hyperlipidemia, unspecified 09/22/2022   Type 2 diabetes mellitus with hyperlipidemia (HCC) 09/22/2022   Obesity (BMI 30-39.9) 09/22/2022   Acute hepatic encephalopathy (HCC) 09/22/2022   MDD (major depressive disorder), recurrent severe, 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   Major depressive disorder, 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 psychotic features (HCC) 09/15/2022   Protein-calorie malnutrition, severe (HCC) 08/31/2022   Hypotension 08/31/2022   Weakness    Hx of transient ischemic attack (TIA) 04/28/2022   Obesity    Chronic diastolic CHF (congestive heart failure) (HCC)    Depression    Sepsis (HCC) 03/28/2022   Bacteremia due to Klebsiella pneumoniae 03/28/2022   Hypoglycemia 03/28/2022   Syncope 03/24/2022   Hypokalemia 03/24/2022   Fever 03/24/2022   COPD (chronic obstructive pulmonary disease) (HCC)    Elevated troponin    Abnormal LFTs    History of hepatitis C    AKI (acute kidney injury) (HCC)    Pure hypercholesterolemia    Obesity, Class III, BMI 40-49.9 (morbid obesity) (HCC) 04/18/2020   Thrombocytopenia (HCC) 04/17/2020   Leukopenia 04/17/2020   HTN (hypertension) 04/17/2018   Uncontrolled type 2 diabetes mellitus with hyperglycemia, without long-term current use of insulin (HCC) 04/17/2018   Lymphedema 04/17/2018   Normocytic anemia 04/07/2018   Chest  pain 03/20/2018   Unsheltered homelessness 12/16/2017     Past Surgical History   Past Surgical History:  Procedure Laterality Date   ANTERIOR CERVICAL DECOMP/DISCECTOMY FUSION N/A 07/01/2023   Procedure: ANTERIOR CERVICAL DISCECTOMY AND FUSION (FORGE);  Surgeon: Venetia Night, MD;  Location: [facility] ORS;  Service: Neurosurgery;  Laterality: N/A;  Keep as last case of the day per Dr Myer Haff   ANTERIOR CERVICAL DISCECTOMY     APPENDECTOMY     CARDIAC CATHETERIZATION     CHOLECYSTECTOMY     EYE SURGERY     INNER EAR SURGERY     NOSE SURGERY     XI ROBOTIC LAPAROSCOPIC ASSISTED APPENDECTOMY N/A 11/05/2022   Procedure: XI ROBOTIC LAPAROSCOPIC ASSISTED APPENDECTOMY;  Surgeon: Carolan Shiver, MD;  Location: [facility] ORS;  Service: General;  Laterality: N/A;     Home Medications   Prior to Admission medications   Medication Sig Start Date End Date Taking? Authorizing Provider  acetaminophen (TYLENOL)  650 MG CR tablet Take 650 mg by mouth every 8 (eight) hours as needed for pain.    [provider]  albuterol (PROVENTIL) (2.5 MG/3ML) 0.083% nebulizer solution Take 2.5 mg by nebulization every 4 (four) hours as needed for wheezing or shortness of breath. 07/16/23   [provider]  atorvastatin (LIPITOR) 40 MG tablet Take 40 mg by mouth daily.    [provider]  citalopram (CELEXA) 20 MG tablet Take 20 mg by mouth daily.    [provider]  feeding supplement (ENSURE ENLIVE / ENSURE PLUS) LIQD Take 237 mLs by mouth 3 (three) times daily between meals. 10/25/23   Jonah Blue, MD  furosemide (LASIX) 20 MG tablet Take 1 tablet (20 mg total) by mouth daily. 11/07/23   Agbata, Elwyn Lade, MD  gabapentin (NEURONTIN) 300 MG capsule Take 300 mg by mouth 2 (two) times daily. 06/07/23   [provider]  JARDIANCE 10 MG TABS tablet Take 10 mg by mouth daily. 10/22/23   [provider]  lactulose (CHRONULAC) 10 GM/15ML solution Take 45 mLs (30  g total) by mouth 3 (three) times daily. 08/05/23   Lanae Boast, MD  losartan (COZAAR) 50 MG tablet Take 50 mg by mouth daily.    [provider]  Multiple Vitamin (MULTIVITAMIN WITH MINERALS) TABS tablet Take 1 tablet by mouth daily.    [provider]  nitroGLYCERIN (NITROLINGUAL) 0.4 MG/SPRAY spray Place 1 spray under the tongue every 5 (five) minutes x 3 doses as needed for chest pain. 10/30/23   Delma Freeze, FNP  ondansetron (ZOFRAN) 4 MG tablet Take 1 tablet (4 mg total) by mouth every 6 (six) hours as needed for nausea. 06/21/23   Alford Highland, MD  pantoprazole (PROTONIX) 40 MG tablet Take 40 mg by mouth daily. 09/24/23   [provider]  phenol (CHLORASEPTIC) 1.4 % LIQD Use as directed 1 spray in the mouth or throat as needed for throat irritation / pain.    [provider]  QUEtiapine (SEROQUEL) 25 MG tablet Take 25 mg by mouth at bedtime as needed (Agitation/sleep).    [provider]  senna (SENOKOT) 8.6 MG TABS tablet Take 1 tablet (8.6 mg total) by mouth 2 (two) times daily as needed for mild constipation. 07/02/23   Susanne Borders, PA  spironolactone (ALDACTONE) 25 MG tablet Take 25 mg by mouth daily. 09/24/23   [provider]  VENTOLIN HFA 108 (90 Base) MCG/ACT inhaler Inhale 2 puffs into the lungs every 4 (four) hours as needed for wheezing. 07/16/23   [provider]  XIFAXAN 550 MG TABS tablet Take 550 mg by mouth 2 (two) times daily. 09/24/23   [provider]     Allergies  Bee venom, Codeine, Rsv mrna pre-f virus vaccine, Meloxicam, Codeine, Novocain [procaine], Sulfa antibiotics, and Sulfa antibiotics   Family History   Family History  Problem Relation Age of Onset   Hypertension Mother    Heart disease Mother    Heart failure Maternal Grandmother      Physical Exam  Triage Vital Signs: ED Triage Vitals  Encounter Vitals Group     BP 11/11/23 2309 (!) 156/72     Systolic BP Percentile  --      Diastolic BP Percentile --      Pulse Rate 11/11/23 2309 (!) 43     Resp --      Temp 11/11/23 2309 97.6 F (36.4 C)     Temp Source 11/11/23  2309 Axillary     SpO2 11/11/23 2309 100 %     Weight 11/11/23 2311 177 lb 4 oz (80.4 kg)     Height 11/11/23 2311 5\' 6"  (1.676 m)     Head Circumference --      Peak Flow --      Pain Score --      Pain Loc --      Pain Education --      Exclude from Growth Chart --     Updated Vital Signs: BP (!) 156/72 (BP Location: Right Arm)   Pulse (!) 43   Temp 97.6 F (36.4 C) (Axillary)   Ht 5\' 6"  (1.676 m)   Wt 80.4 kg   SpO2 100%   BMI 28.61 kg/m    General: Eyes open, mild distress.   CV:  Bradycardic.  Good peripheral perfusion.  Resp:  Normal effort.  CTAB. Abd:  Hepatomegaly noted.  Mild distention.  Other:  PERRL.  Eyes open, responsive only to painful stimuli.   ED Results / Procedures / Treatments  Labs (all labs ordered are listed, but only abnormal results are displayed) Labs Reviewed  AMMONIA - Abnormal; Notable for the following components:      Result Value   Ammonia 190 (*)    All other components within normal limits  SALICYLATE LEVEL - Abnormal; Notable for the following components:   Salicylate Lvl <7.0 (*)    All other components within normal limits  ACETAMINOPHEN LEVEL - Abnormal; Notable for the following components:   Acetaminophen (Tylenol), Serum <10 (*)    All other components within normal limits  COMPREHENSIVE METABOLIC PANEL - Abnormal; Notable for the following components:   Glucose, Bld 100 (*)    BUN 28 (*)    Creatinine, Ser 1.64 (*)    Calcium 10.9 (*)    Albumin 3.0 (*)    AST 47 (*)    Total Bilirubin 1.4 (*)    GFR, Estimated 44 (*)    All other components within normal limits  CBC WITH DIFFERENTIAL/PLATELET - Abnormal; Notable for the following components:   WBC 2.6 (*)    RBC 3.73 (*)    Hemoglobin 11.9 (*)    HCT 34.9 (*)    RDW 16.0 (*)    Platelets 82 (*)    Neutro Abs  1.0 (*)    All other components within normal limits  TROPONIN I (HIGH SENSITIVITY) - Abnormal; Notable for the following components:   Troponin I (High Sensitivity) 19 (*)    All other components within normal limits  ETHANOL  URINE DRUG SCREEN, QUALITATIVE ([facility] ONLY)  URINALYSIS, ROUTINE W REFLEX MICROSCOPIC     EKG  ED ECG REPORT I, Joeanna Howdyshell J, the attending physician, personally viewed and interpreted this ECG.   Date: 11/12/2023  EKG Time: 2306  Rate: 44  Rhythm: sinus bradycardia  Axis: Normal  Intervals:nonspecific intraventricular conduction delay  ST&T Change: Nonspecific    RADIOLOGY I have independently visualized and interpreted patient's imaging studies as well as noted the radiology interpretation:  CT head: No ICH  Chest x-ray: No acute cardiopulmonary process  Official radiology report(s): CT Head Wo Contrast Result Date: 11/12/2023 CLINICAL DATA:  Mental status change, unknown cause, found unresponsive EXAM: CT HEAD WITHOUT CONTRAST TECHNIQUE: Contiguous axial images were obtained from the base of the skull through the vertex without intravenous contrast. RADIATION DOSE REDUCTION: This exam was performed according to the departmental dose-optimization program which includes automated exposure control, adjustment  of the mA and/or kV according to patient size and/or use of iterative reconstruction technique. COMPARISON:  11/04/2023 FINDINGS: Brain: No intracranial hemorrhage, mass effect, or evidence of acute infarct. No hydrocephalus. No extra-axial fluid collection. Age-commensurate cerebral atrophy and chronic small vessel ischemic disease. Vascular: No hyperdense vessel. Intracranial arterial calcification. Skull: No fracture or focal lesion. Sinuses/Orbits: No acute finding. Other: None. IMPRESSION: No acute intracranial abnormality. Electronically Signed   By: Minerva Fester M.D.   On: 11/12/2023 00:04   DG Chest Port 1 View Result Date:  11/11/2023 CLINICAL DATA:  Found unresponsive, confusion EXAM: PORTABLE CHEST 1 VIEW COMPARISON:  10/31/2023 FINDINGS: The heart size and mediastinal contours are within normal limits. Both lungs are clear. The visualized skeletal structures are unremarkable. IMPRESSION: No active disease. Electronically Signed   By: Sharlet Salina M.D.   On: 11/11/2023 23:41     PROCEDURES:  Critical Care performed: Yes, see critical care procedure note(s)  CRITICAL CARE Performed by: Irean Hong   Total critical care time: 30 minutes  Critical care time was exclusive of separately billable procedures and treating other patients.  Critical care was necessary to treat or prevent imminent or life-threatening deterioration.  Critical care was time spent personally by me on the following activities: development of treatment plan with patient and/or surrogate as well as nursing, discussions with consultants, evaluation of patient's response to treatment, examination of patient, obtaining history from patient or surrogate, ordering and performing treatments and interventions, ordering and review of laboratory studies, ordering and review of radiographic studies, pulse oximetry and re-evaluation of patient's condition.   Marland Kitchen1-3 Lead EKG Interpretation  Performed by: Irean Hong, MD Authorized by: Irean Hong, MD     Interpretation: abnormal     ECG rate:  44   ECG rate assessment: bradycardic     Rhythm: sinus bradycardia     Ectopy: none     Conduction: normal   Comments:     Patient placed on cardiac monitor to evaluate for arrhythmias    MEDICATIONS ORDERED IN ED: Medications  lactulose (CHRONULAC) enema 200 gm (has no administration in time range)  naloxone [facility]) injection 1 mg (1 mg Intravenous Given 11/12/23 0002)     IMPRESSION / MDM / ASSESSMENT AND PLAN / ED COURSE  I reviewed the triage vital signs and the nursing notes.                             73 year old male brought for  altered mental status.Differential diagnosis includes, but is not limited to, alcohol, illicit or prescription medications, or other toxic ingestion; intracranial pathology such as stroke or intracerebral hemorrhage; fever or infectious causes including sepsis; hypoxemia and/or hypercarbia; uremia; trauma; endocrine related disorders such as diabetes, hypoglycemia, and thyroid-related diseases; hypertensive encephalopathy; etc. I personally viewed patient's records and note recent hospitalization 3/6 - 11/07/2023 for hepatic encephalopathy.  Patient's presentation is most consistent with acute presentation with potential threat to life or bodily function.  The patient is on the cardiac monitor to evaluate for evidence of arrhythmia and/or significant heart rate changes.  Will obtain lab work including ammonia, CT head, chest x-ray.  Consider Narcan after patient returns from CT head.  Anticipate hospitalization.  Clinical Course as of 11/12/23 0025  Tue Nov 12, 2023  0024 No response with 1 mg IV Narcan.  Laboratory results remarkable for elevated ammonia level of 190 which is higher than 8 days ago.  Will administer rectal Lactulose, consult hospitalist services for evaluation and admission. [JS]    Clinical Course User Index [JS] Irean Hong, MD     FINAL CLINICAL IMPRESSION(S) / ED DIAGNOSES   Final diagnoses:  Altered mental status, unspecified altered mental status type  Hyperammonemia (HCC)  Hepatic encephalopathy (HCC)     Rx / DC Orders   ED Discharge Orders     None        Note:  This document was prepared using Dragon voice recognition software and may include unintentional dictation errors.   Irean Hong, MD 11/12/23 [phone]

## 2023-11-12 NOTE — Consult Note (Signed)
 Consultation Note Date: 11/12/2023 at 1000  Patient Name: [name]  DOB: Feb 06, 1951  MRN: [id number]  Age / Sex: 73 y.o., male  PCP: [facility] Referring Physician: Delfino Lovett, MD  HPI/Patient Profile: 73 y.o. male  with past medical history of HFpEF, anasarca, anxiety, arthritis, bilateral CAD, bradycardia, COPD, liver cirrhosis, hep B, hep C, recurrent hepatic encephalopathy, thrombocytopenia, GERD, PTSD ([employer],witnessed a friend die in front of him), suicidal ideations, pulmonary hypertension, OSA, type 2 diabetes, TIA, chronic pain, DDD admitted on 11/11/2023 from his group home after he was found unresponsive by staff.  Of note, he has had 6 visits to the ED over the last 6 months.  This is patient's 5th inpatient admission in the last 6 months.   Patient is being treated for acute hepatic encephalopathy, pancytopenia, and AKI.  PMT was consulted to support patient with goals of care discussions.  Of note, patient is familiar to Clinical research associate as I saw him during his December admission.  Clinical Assessment and Goals of Care: Extensive chart review completed prior to meeting patient including labs, vital signs, imaging, progress notes, orders, and available advanced directive documents from current and previous encounters. I then met with patient in the ED to discuss diagnosis prognosis, GOC, EOL wishes, disposition and options.  He was awake but his gaze did not meet mine.  He was able to respond appropriately when asked where he was.  However, asked why he was in the hospital, he again repeated "[facility]".  When asked what happened at his group home for him to come to the hospital, patient just stared blankly at the ceiling and said "no".  Patient unable to participate in goals of care or medical decision making independently at this time.  Given that patient cannot make decisions  for himself at this time, I asked the patient to he leans on, who his support system is, and who he would call in case of emergency.  He responded "it is just me".  I asked if there were family or friends available and I even mention his group home Lobbyist.  He shook his head no.  Symptoms assessed.  Patient has no signs of distress or pain at this time.  He is unable to participate in system assessment.  No adjustment to [facility] needed at this time.  After assessing the patient, I counseled with attending Dr. Clelia Croft, RN Tiffanie, and TOC.  Given that patient lacks capacity to make decisions for himself at this time, next of kin/surrogate decision maker is needed, not known.  TOC to perform due diligence to determine if next of kin decision maker is available.  TOC has reached out to group home.   No adjustment to plan of care or MAR at this time.  As per MOST form and my discussions with patient in December 2024, patient at that time wanted to be a DNR with limited interventions.  In the event of a cardiopulmonary arrest, patient would not be accepting of CPR or mechanical  ventilatory support.  However, if patient is still breathing/has a pulse, he was accepting of all other offered, available, and appropriate medical interventions to sustain his life.  DNR with limited interventions remains.  Awaiting response from group home/TOC.  Primary Decision Maker OTHER  Physical Exam Constitutional:      General: He is not in acute distress.    Appearance: He is obese. He is not ill-appearing.  HENT:     Mouth/Throat:     Mouth: Mucous membranes are moist.  Eyes:     Pupils: Pupils are equal, round, and reactive to light.  Cardiovascular:     Rate and Rhythm: Normal rate.  Pulmonary:     Effort: Pulmonary effort is normal.  Abdominal:     Palpations: Abdomen is soft.  Skin:    General: Skin is warm and dry.     Coloration: Skin is jaundiced.  Neurological:     Mental Status: He is alert.      Comments: Oriented to self and place  Psychiatric:        Behavior: Behavior normal.     Palliative Assessment/Data: 50-60%     Thank you for this consult. Palliative medicine will continue to follow and assist holistically.   Time Total: 75 minutes  Time spent includes: Detailed review of medical records (labs, imaging, vital signs), medically appropriate exam (mental status, respiratory, cardiac, skin), discussed with treatment team, counseling and educating patient, family and staff, documenting clinical information, medication management and coordination of care.  Signed by: Georgiann Cocker, DNP, FNP-BC Palliative Medicine   Please contact Palliative Medicine Team providers via [facility] for questions and concerns.

## 2023-11-12 NOTE — TOC CM/SW Note (Addendum)
 CSW rec'd [employer] stating that patient lacks capacity at this current time.  CSW phoned Doristine Mango [phone]), the owner of the group home where pt lives.  He states that he has no contact information for patient's NOK.

## 2023-11-12 NOTE — Hospital Course (Signed)
 Marland Kitchen

## 2023-11-13 DIAGNOSIS — K7682 Hepatic encephalopathy: Secondary | ICD-10-CM | POA: Diagnosis not present

## 2023-11-13 DIAGNOSIS — R4182 Altered mental status, unspecified: Secondary | ICD-10-CM | POA: Diagnosis not present

## 2023-11-13 DIAGNOSIS — I5032 Chronic diastolic (congestive) heart failure: Secondary | ICD-10-CM

## 2023-11-13 DIAGNOSIS — R001 Bradycardia, unspecified: Secondary | ICD-10-CM | POA: Diagnosis not present

## 2023-11-13 DIAGNOSIS — Z515 Encounter for palliative care: Secondary | ICD-10-CM | POA: Diagnosis not present

## 2023-11-13 DIAGNOSIS — K7030 Alcoholic cirrhosis of liver without ascites: Secondary | ICD-10-CM

## 2023-11-13 LAB — BASIC METABOLIC PANEL
Anion gap: 8 (ref 5–15)
BUN: 26 mg/dL — ABNORMAL HIGH (ref 8–23)
CO2: 21 mmol/L — ABNORMAL LOW (ref 22–32)
Calcium: 10.1 mg/dL (ref 8.9–10.3)
Chloride: 113 mmol/L — ABNORMAL HIGH (ref 98–111)
Creatinine, Ser: 1.28 mg/dL — ABNORMAL HIGH (ref 0.61–1.24)
GFR, Estimated: 59 mL/min — ABNORMAL LOW (ref >60)
Glucose, Bld: 61 mg/dL — ABNORMAL LOW (ref 70–99)
Potassium: 3.7 mmol/L (ref 3.5–5.1)
Sodium: 142 mmol/L (ref 135–145)

## 2023-11-13 LAB — CBC
HCT: 33.4 % — ABNORMAL LOW (ref 39.0–52.0)
Hemoglobin: 11.4 g/dL — ABNORMAL LOW (ref 13.0–17.0)
MCH: 31.8 pg (ref 26.0–34.0)
MCHC: 34.1 g/dL (ref 30.0–36.0)
MCV: 93.3 fL (ref 80.0–100.0)
Platelets: 73 10*3/uL — ABNORMAL LOW (ref 150–400)
RBC: 3.58 MIL/uL — ABNORMAL LOW (ref 4.22–5.81)
RDW: 15.7 % — ABNORMAL HIGH (ref 11.5–15.5)
WBC: 3.5 10*3/uL — ABNORMAL LOW (ref 4.0–10.5)
nRBC: 0 % (ref 0.0–0.2)

## 2023-11-13 LAB — CBG MONITORING, ED
Glucose-Capillary: 93 mg/dL (ref 70–99)
Glucose-Capillary: 96 mg/dL (ref 70–99)

## 2023-11-13 LAB — AMMONIA: Ammonia: 39 umol/L — ABNORMAL HIGH (ref 9–35)

## 2023-11-13 MED ORDER — LACTULOSE 10 GM/15ML PO SOLN
20.0000 g | Freq: Two times a day (BID) | ORAL | Status: DC
  Administered 2023-11-13 – 2023-11-15 (×5): 20 g via ORAL
  Filled 2023-11-13 (×5): qty 30

## 2023-11-13 NOTE — Progress Notes (Signed)
 Spoke with Kieth Brightly at the [facility] Department Sex Offender Office. Ms. Bess Kinds unaware if patient has any living next of kin, as she stated he left contact information blank on all his paper work. He also did not provide a place of birth or previous address.   Patient was released from prison in 2024 into homelessness. He remained homeless until moving to a group home.   Ms. Bess Kinds also reviewed [facility] database in attempt to find additional information. No relatives located at this time. If she is able to find anything else, she will let us know.

## 2023-11-13 NOTE — ED Notes (Signed)
 Patient provided meal tray and orange juice

## 2023-11-13 NOTE — Progress Notes (Addendum)
 Palliative Care Progress Note, Assessment & Plan   Patient Name: [name]       Date: 11/13/2023 DOB: 1950/12/30  Age: 73 y.o. MRN#: [id number] Attending Physician: Marcelino Duster, MD Primary Care Physician: [facility] Admit Date: 11/11/2023  Subjective: Is lying in bed, awake and alert.  He acknowledges my presence.  No family or friends present during my visit.  HPI: 73 y.o. male  with past medical history of HFpEF, anasarca, anxiety, arthritis, bilateral CAD, bradycardia, COPD, liver cirrhosis, hep B, hep C, recurrent hepatic encephalopathy, thrombocytopenia, GERD, PTSD ([employer],witnessed a friend die in front of him), suicidal ideations, pulmonary hypertension, OSA, type 2 diabetes, TIA, chronic pain, DDD admitted on 11/11/2023 from his group home after he was found unresponsive by staff.   Of note, he has had 6 visits to the ED over the last 6 months.  This is patient's 5th inpatient admission in the last 6 months.    Patient is being treated for acute hepatic encephalopathy, pancytopenia, and AKI.   PMT was consulted to support patient with goals of care discussions.  Of note, patient is familiar to Clinical research associate as I saw him during his December 2024 admission.  Summary of counseling/coordination of care: Extensive chart review completed prior to meeting patient including labs, vital signs, imaging, progress notes, orders, and available advanced directive documents from current and previous encounters.   After reviewing the patient's chart and assessing the patient at bedside, I attempted to speak with patient about symptom management and gauge his understanding of his current medical situation.  When asked how he is feeling, he states, "these machines are not working right.   There is a big bug flying around". He then stared at me.  I attempted to again gauge how patient is feeling.  He continued to stare at me without engaging in discussions.  Asked if he knows where he is, he says "[facility]".  When asked why he was brought to the hospital, he responds "[facility]".  When asked if he remembers what happens before he was hospitalized, he responds "[employer]".  Patient was admitted for acute hepatic encephalopathy and has no established next of kin or surrogate decision maker.  TOC and RN Tiffanie are following closely for due diligence for decision maker.  While capacity waxes and wanes, and can change every day, patient today does not have capacity to make medical decisions for himself.  Today, patient is unable to participate in goals of care or medical decision making independently at this time.  Time for outcomes and hope of patient's mentation clearing needed.   Above conveyed to attending and the rest of the medical team.    Physical Exam Vitals reviewed.  Constitutional:      Appearance: He is normal weight.  HENT:     Head: Normocephalic.     Nose: Nose normal.     Mouth/Throat:     Mouth: Mucous membranes are moist.  Eyes:     Pupils: Pupils are equal, round, and reactive to light.  Cardiovascular:     Rate and Rhythm: Normal rate.  Pulmonary:     Effort: Pulmonary effort is normal.  Abdominal:     Palpations: Abdomen is soft.  Skin:    General: Skin is warm and dry.     Coloration: Skin is jaundiced.  Neurological:     Mental Status: He is alert.     Comments: Oriented to self and place  Psychiatric:        Behavior: Behavior normal.             Total Time 35 minutes   Time spent includes: Detailed review of medical records (labs, imaging, vital signs), medically appropriate exam (mental status, respiratory, cardiac, skin), discussed with treatment team, counseling and educating patient,  family and staff, documenting clinical information, medication management and coordination of care.  Samara Deist L. Bonita Quin, DNP, FNP-BC Palliative Medicine Team

## 2023-11-13 NOTE — Progress Notes (Signed)
 Progress Note   Patient: [name]:[id number] DOB: 1951/08/14 DOA: 11/11/2023     1 DOS: the patient was seen and examined on 11/13/2023   Brief hospital course: QASIM DIVELEY is a 73 y.o. male with medical history significant for hepatitis C, liver cirrhosis, hepatic encephalopathy recurrent, TIA, chronic HFpEF, CAD, COPD, IIDM, HTN, HLD, sent from group home after he was found unresponsive by the staff at his group home.  Recently hospitalized from 3/6 to 3/13 for hepatic encephalopathy with similar presentation and improvement to baseline with lactulose therapy.  Multiple prior admissions noted, palliative team involved for goals of care discussion.  Assessment and Plan: Recurrent hepatic encephalopathy Patient's mental status improved. Will give trial of p.o. and if tolerated start him on diet. Lactulose p.o. will be initiated. Continue aspiration precautions. Palliative care on board for goals of care discussion. PT evaluation for out of bed to chair.  Pancytopenia In the setting of liver cirrhosis. No active bleeding. Continue to trend CBC.  Sinus bradycardia- Stable. He is not on any beta blocker. Continue telemetry.  Acute kidney injury: Baseline creatinine 0.94 presented with 1.64. In the setting of recent diuretic treatment. Hold diuretics, Cozaar Encourage oral diet, supplements.  Hypertension Chronic HFpEF Plan to resume Lasix, Cozaar, spironolactone once BP, AKI improves.  COPD No acute concerns   Major depression disorder Continue Celexa hydroxyzine as needed and Seroquel as needed.   Out of bed to chair. Incentive spirometry. Nursing supportive care. Fall, aspiration precautions. Diet:  Diet Orders (From admission, onward)     Start     Ordered   11/13/23 0939  Diet full liquid Room service appropriate? Yes; Fluid consistency: Thin  Diet effective now       Question Answer Comment  Room service appropriate? Yes   Fluid consistency: Thin       11/13/23 0938           DVT prophylaxis: SCDs Start: 11/12/23 0220  Level of care: Progressive   Code Status: Limited: Do not attempt resuscitation (DNR) -DNR-LIMITED -Do Not Intubate/DNI   Subjective: Patient is seen and examined today morning.  He is more alert and awake.  Able to answer me appropriately.  Wishes to eat.  He is weak did not get out of bed.  Physical Exam: Vitals:   11/13/23 0107 11/13/23 0730 11/13/23 0740 11/13/23 1256  BP: (!) 110/57 139/72 139/72 (!) 113/90  Pulse: 61 (!) 48 (!) 47 (!) 48  Resp: 15 13 14 15   Temp: 98.6 F (37 C)  99.4 F (37.4 C) 99 F (37.2 C)  TempSrc: Oral  Oral Oral  SpO2: 100% 100% 100% 96%  Weight:      Height:        General - Elderly weak Caucasian male, no apparent distress HEENT - PERRLA, EOMI, atraumatic head, non tender sinuses. Lung - Clear, basal rales, no rhonchi, wheezes. Heart - S1, S2 heard, no murmurs, rubs, trace pedal edema. Abdomen - Soft, non tender, bowel sounds good Neuro - Alert, awake and oriented, non focal exam. Skin - Warm and dry.  Data Reviewed:      Latest Ref Rng & Units 11/13/2023    6:09 AM 11/12/2023    5:26 AM 11/11/2023   11:24 PM  CBC  WBC 4.0 - 10.5 K/uL 3.5  2.3  2.6   Hemoglobin 13.0 - 17.0 g/dL 81.1  91.4  78.2   Hematocrit 39.0 - 52.0 % 33.4  31.2  34.9   Platelets  150 - 400 K/uL 73  67  82       Latest Ref Rng & Units 11/13/2023    6:09 AM 11/12/2023    5:26 AM 11/11/2023   11:24 PM  BMP  Glucose 70 - 99 mg/dL 61  95  147   BUN 8 - 23 mg/dL 26  29  28    Creatinine 0.61 - 1.24 mg/dL 8.29  5.62  1.30   Sodium 135 - 145 mmol/L 142  139  139   Potassium 3.5 - 5.1 mmol/L 3.7  3.8  4.1   Chloride 98 - 111 mmol/L 113  112  106   CO2 22 - 32 mmol/L 21  21  24    Calcium 8.9 - 10.3 mg/dL 86.5  78.4  69.6    CT Head Wo Contrast Result Date: 11/12/2023 CLINICAL DATA:  Mental status change, unknown cause, found unresponsive EXAM: CT HEAD WITHOUT CONTRAST TECHNIQUE: Contiguous  axial images were obtained from the base of the skull through the vertex without intravenous contrast. RADIATION DOSE REDUCTION: This exam was performed according to the departmental dose-optimization program which includes automated exposure control, adjustment of the mA and/or kV according to patient size and/or use of iterative reconstruction technique. COMPARISON:  11/04/2023 FINDINGS: Brain: No intracranial hemorrhage, mass effect, or evidence of acute infarct. No hydrocephalus. No extra-axial fluid collection. Age-commensurate cerebral atrophy and chronic small vessel ischemic disease. Vascular: No hyperdense vessel. Intracranial arterial calcification. Skull: No fracture or focal lesion. Sinuses/Orbits: No acute finding. Other: None. IMPRESSION: No acute intracranial abnormality. Electronically Signed   By: Minerva Fester M.D.   On: 11/12/2023 00:04   DG Chest Port 1 View Result Date: 11/11/2023 CLINICAL DATA:  Found unresponsive, confusion EXAM: PORTABLE CHEST 1 VIEW COMPARISON:  10/31/2023 FINDINGS: The heart size and mediastinal contours are within normal limits. Both lungs are clear. The visualized skeletal structures are unremarkable. IMPRESSION: No active disease. Electronically Signed   By: Sharlet Salina M.D.   On: 11/11/2023 23:41    Family Communication: Discussed with patient, he understand and agree. All questions answered.  Disposition: Status is: Inpatient Remains inpatient appropriate because: Encephalopathy, weakness  Planned Discharge Destination: Home with Home Health     Time spent: 39 minutes  Author: Marcelino Duster, MD 11/13/2023 3:48 PM Secure chat 7am to 7pm For on call review [email or web address].

## 2023-11-13 NOTE — TOC Initial Note (Addendum)
 Transition of Care [facility]) - Initial/Assessment Note    Patient Details  Name: [name] MRN: [id number] Date of Birth: 11-07-1950  Transition of Care [facility]) CM/SW Contact:    Margarito Liner, LCSW Phone Number: 11/13/2023, 2:24 PM  Clinical Narrative:   Patient was discharged last week back to [facility]. [facility] Home Health liaison is confirming that they're still active with him for PT and OT.           Expected Discharge Plan: Group Home Barriers to Discharge: Continued Medical Work up   Patient Goals and CMS Choice            Expected Discharge Plan and Services       Living arrangements for the past 2 months: Group Home                                      Prior Living Arrangements/Services Living arrangements for the past 2 months: Group Home Lives with:: Facility Resident Patient language and need for interpreter reviewed:: Yes Do you feel safe going back to the place where you live?: Yes      Need for Family Participation in Patient Care: Yes (Comment) Care giver support system in place?: Yes (comment) Current home services: Home PT, Home OT Criminal Activity/Legal Involvement Pertinent to Current Situation/Hospitalization: No - Comment as needed  Activities of Daily Living      Permission Sought/Granted                  Emotional Assessment       Orientation: : Oriented to Self, Oriented to Place, Oriented to Situation Alcohol / Substance Use: Not Applicable Psych Involvement: No (comment)  Admission diagnosis:  Hepatic encephalopathy (HCC) [K76.82] Patient Active Problem List   Diagnosis Date Noted   Altered mental status 11/12/2023   Type II diabetes mellitus with renal manifestations (HCC) 10/21/2023   Chronic kidney disease, stage 3a (HCC) 10/21/2023   Myocardial injury 10/21/2023   CKD stage 3a, GFR 45-59 ml/min (HCC) - baseline scr 1.3 10/01/2023   Hepatic encephalopathy (HCC) 09/30/2023   Liver cirrhosis (HCC)  09/30/2023   Essential hypertension 09/30/2023   Diabetes mellitus type 2, noninsulin dependent (HCC) 09/30/2023   CAD (coronary artery disease) 09/30/2023   Hyperlipidemia 09/30/2023   Hepatic encephalopathy (HCC) 08/01/2023   Hyperammonemia (HCC) 07/30/2023   Cervical spinal stenosis 07/01/2023   Cervical myelopathy (HCC) 06/21/2023   Cervical stenosis of spine 06/21/2023   Cervical radiculopathy 06/20/2023   E coli bacteremia 06/17/2023   Left leg cellulitis 03/23/2023   Leukocytosis 03/23/2023   Serum total bilirubin elevated 03/23/2023   Left leg pain 03/23/2023   Suicidal ideation 01/14/2023   Acute pain 01/14/2023   Adrenal insufficiency (HCC) 12/26/2022   Orthostatic hypotension 12/24/2022   Rib pain on right side 12/24/2022   Hepatitis B infection 12/24/2022   Myocardial ischemia 12/24/2022   Acute appendicitis 11/05/2022   Pancytopenia (HCC) 11/05/2022   Sinus bradycardia 11/05/2022   Acute metabolic encephalopathy 09/22/2022   Liver lesion 09/22/2022   Hyperlipidemia, unspecified 09/22/2022   Type 2 diabetes mellitus with hyperlipidemia (HCC) 09/22/2022   Obesity (BMI 30-39.9) 09/22/2022   Acute hepatic encephalopathy (HCC) 09/22/2022   MDD (major depressive disorder), recurrent severe, 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   Major depressive disorder, 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 psychotic features (HCC) 09/15/2022   Protein-calorie malnutrition, severe (HCC) 08/31/2022   Hypotension 08/31/2022  Weakness    Hx of transient ischemic attack (TIA) 04/28/2022   Obesity    Chronic diastolic CHF (congestive heart failure) (HCC)    Depression    Sepsis (HCC) 03/28/2022   Bacteremia due to Klebsiella pneumoniae 03/28/2022   Hypoglycemia 03/28/2022   Syncope 03/24/2022   Hypokalemia 03/24/2022   Fever 03/24/2022   COPD (chronic obstructive pulmonary disease) (HCC)    Elevated troponin    Abnormal LFTs    History of hepatitis C    AKI (acute kidney injury) (HCC)     Pure hypercholesterolemia    Obesity, Class III, BMI 40-49.9 (morbid obesity) (HCC) 04/18/2020   Thrombocytopenia (HCC) 04/17/2020   Leukopenia 04/17/2020   HTN (hypertension) 04/17/2018   Uncontrolled type 2 diabetes mellitus with hyperglycemia, without long-term current use of insulin (HCC) 04/17/2018   Lymphedema 04/17/2018   Normocytic anemia 04/07/2018   Chest pain 03/20/2018   Unsheltered homelessness 12/16/2017   PCP:  [facility] Pharmacy:   [id number] [address] ([address]), [place] - [address] [name]) [place] [address] Phone: [phone] Fax: [phone]  [facility] [address] [place] [address] Phone: [phone] Fax: [phone]  [facility] - [place], [place] - [place] [address] [place] [address] Phone: [phone] Fax: [phone]     Social Drivers of Health (SDOH) Social History: SDOH Screenings   Food Insecurity: Patient Unable To Answer (10/31/2023)  Housing: Patient Unable To Answer (10/31/2023)  Transportation Needs: Patient Unable To Answer (10/31/2023)  Utilities: Patient Unable To Answer (10/31/2023)  Alcohol Screen: Low Risk  (09/24/2022)  Depression (PHQ2-9): High Risk (04/26/2022)  Financial Resource Strain: High Risk (04/27/2022)  Physical Activity: Sufficiently Active (04/16/2018)  Social Connections: Patient Unable To Answer (10/31/2023)  Stress: Stress Concern Present (04/16/2018)  Tobacco Use: High Risk (10/31/2023)   SDOH Interventions:     Readmission Risk Interventions    11/13/2023    2:21 PM 11/12/2023    4:00 PM 06/18/2023   12:36 PM  Readmission Risk Prevention Plan  Transportation Screening  Not Complete Complete  Medication Review Oceanographer) Complete Not Complete Complete  PCP or Specialist appointment within 3-5 days of discharge Complete Complete Complete   HRI or Home Care Consult Complete  Complete  SW Recovery Care/Counseling Consult Complete  Not Complete  Palliative Care Screening Complete  Not Complete  Skilled Nursing Facility Not Applicable  Complete

## 2023-11-14 DIAGNOSIS — E7220 Disorder of urea cycle metabolism, unspecified: Secondary | ICD-10-CM | POA: Diagnosis not present

## 2023-11-14 DIAGNOSIS — R001 Bradycardia, unspecified: Secondary | ICD-10-CM | POA: Diagnosis not present

## 2023-11-14 DIAGNOSIS — Z515 Encounter for palliative care: Secondary | ICD-10-CM | POA: Diagnosis not present

## 2023-11-14 DIAGNOSIS — R4182 Altered mental status, unspecified: Secondary | ICD-10-CM | POA: Diagnosis not present

## 2023-11-14 DIAGNOSIS — I5032 Chronic diastolic (congestive) heart failure: Secondary | ICD-10-CM | POA: Diagnosis not present

## 2023-11-14 DIAGNOSIS — K7682 Hepatic encephalopathy: Secondary | ICD-10-CM | POA: Diagnosis not present

## 2023-11-14 NOTE — Progress Notes (Signed)
 Palliative Care Progress Note, Assessment & Plan   Patient Name: [name]       Date: 11/14/2023 DOB: 02/21/1951  Age: 73 y.o. MRN#: [id number] Attending Physician: Marcelino Duster, MD Primary Care Physician: [facility] Admit Date: 11/11/2023  Subjective: Patient is lying in bed, awake and alert, in no apparent distress.  He acknowledges my presence.  No family or friends present during my visit.  HPI: 73 y.o. male  with past medical history of HFpEF, anasarca, anxiety, arthritis, bilateral CAD, bradycardia, COPD, liver cirrhosis, hep B, hep C, recurrent hepatic encephalopathy, thrombocytopenia, GERD, PTSD ([employer],witnessed a friend die in front of him), suicidal ideations, pulmonary hypertension, OSA, type 2 diabetes, TIA, chronic pain, DDD admitted on 11/11/2023 from his group home after he was found unresponsive by staff.   Of note, he has had 6 visits to the ED over the last 6 months.  This is patient's 5th inpatient admission in the last 6 months.    Patient is being treated for acute hepatic encephalopathy, pancytopenia, and AKI.   PMT was consulted to support patient with goals of care discussions.  Of note, patient is familiar to Clinical research associate as I saw him during his December 2024 admission.  Summary of counseling/coordination of care: Extensive chart review completed prior to meeting patient including labs, vital signs, imaging, progress notes, orders, and available advanced directive documents from current and previous encounters.   After reviewing the patient's chart and assessing the patient at bedside, I spoke with patient in regards to symptom management and goals of care.   I attempted to assess patient's symptoms.  He was unable to engage in conversation in a linear  fashion.  When asked how he was feeling, he shares that "I did all the crackers".  I attempted to redirect patient and asked basic/simple questions.  He continued to speak about crackers and machines.  His responses were significantly delayed and he stared at me without responding when asked basic orientation questions.    I highlighted that patient continues to return home and quickly returned to the hospital.  He shares he likes his home because they "check on me".  I discussed that we are providing care here that does not seem to be preventing future hospitalizations.  He shares that he likes coming back and forth.  Patient unable to participate in goals of care medical decision making independently at this time.  He remains encephalopathic.  Given patient's continued encephalopathy, PMT remains unable to complete goals of care discussions at this time.    PMT will continue to follow and support patient.  I am off service until Monday and plan to follow-up with patient at that time.  Should acute palliative needs arise before then, please utilize Amion for PMT providers available.  Physical Exam Constitutional:      General: He is not in acute distress.    Appearance: He is normal weight. He is not ill-appearing.  HENT:     Nose: Nose normal.     Mouth/Throat:     Mouth: Mucous membranes are moist.  Eyes:     Pupils: Pupils are equal, round, and reactive to light.  Cardiovascular:  Rate and Rhythm: Normal rate.  Pulmonary:     Effort: Pulmonary effort is normal.  Abdominal:     Palpations: Abdomen is soft.  Skin:    General: Skin is warm and dry.     Coloration: Skin is jaundiced.  Neurological:     Mental Status: He is alert.     Comments: Oriented to self and place  Psychiatric:        Behavior: Behavior normal.             Total Time 25 minutes   Time spent includes: Detailed review of medical records (labs, imaging, vital signs), medically appropriate exam (mental  status, respiratory, cardiac, skin), discussed with treatment team, counseling and educating patient, family and staff, documenting clinical information, medication management and coordination of care.  Samara Deist L. Bonita Quin, DNP, FNP-BC Palliative Medicine Team

## 2023-11-14 NOTE — Progress Notes (Signed)
 Progress Note   Patient: [name]:[id number] DOB: 03/26/1951 DOA: 11/11/2023     2 DOS: the patient was seen and examined on 11/14/2023   Brief hospital course: [name] is a 73 y.o. male with medical history significant for hepatitis C, liver cirrhosis, hepatic encephalopathy recurrent, TIA, chronic HFpEF, CAD, COPD, IIDM, HTN, HLD, sent from group home after he was found unresponsive by the staff at his group home.  Recently hospitalized from 3/6 to 3/13 for hepatic encephalopathy with similar presentation and improvement to baseline with lactulose therapy.  Multiple prior admissions noted, palliative team involved for goals of care discussion.  Assessment and Plan: Recurrent hepatic encephalopathy Patient's mental status improved. Will give trial of p.o. and if tolerated start him on diet. Continue Lactulose p.o.. Continue aspiration precautions. Palliative care on board for goals of care discussion. No next of kin on record, and his mental status waxes and wanes. Unable to get NOK at this time. PT/ OT evaluation for out of bed to chair.  Pancytopenia In the setting of liver cirrhosis. No active bleeding. Continue to trend CBC.  Sinus bradycardia- Stable. He is not on any beta blocker. Continue telemetry.  Acute kidney injury: Baseline creatinine 0.94 presented with 1.64. Improving. Hold diuretics, Cozaar Encourage oral diet, supplements.  Hypertension Chronic HFpEF Plan to resume Lasix, Cozaar, spironolactone once BP, AKI improves.  COPD No acute concerns   Major depression disorder Continue Celexa hydroxyzine as needed and Seroquel as needed.   Out of bed to chair. Incentive spirometry. Nursing supportive care. Fall, aspiration precautions. Diet:  Diet Orders (From admission, onward)     Start     Ordered   11/13/23 0939  Diet full liquid Room service appropriate? Yes; Fluid consistency: Thin  Diet effective now       Question Answer Comment   Room service appropriate? Yes   Fluid consistency: Thin      11/13/23 0938           DVT prophylaxis: SCDs Start: 11/12/23 0220  Level of care: Progressive   Code Status: Limited: Do not attempt resuscitation (DNR) -DNR-LIMITED -Do Not Intubate/DNI   Subjective: Patient is seen and examined today morning.  He is more alert and awake. Eating fair.  Encouraged to get out of bed and work with PT.  Physical Exam: Vitals:   11/14/23 0630 11/14/23 0645 11/14/23 0900 11/14/23 1000  BP: 107/70 (!) 142/69 (!) 110/41 102/66  Pulse: 95 (!) 44  (!) 46  Resp:  14  16  Temp:  97.8 F (36.6 C)  98.4 F (36.9 C)  TempSrc:  Oral    SpO2: (!) 63% 100% 96% 94%  Weight:      Height:        General - Elderly weak Caucasian male, no apparent distress HEENT - PERRLA, EOMI, atraumatic head, non tender sinuses. Lung - Clear, basal rales, no rhonchi, wheezes. Heart - S1, S2 heard, no murmurs, rubs, trace pedal edema. Abdomen - Soft, non tender, bowel sounds good Neuro - Alert, awake and oriented, non focal exam. Skin - Warm and dry.  Data Reviewed:      Latest Ref Rng & Units 11/13/2023    6:09 AM 11/12/2023    5:26 AM 11/11/2023   11:24 PM  CBC  WBC 4.0 - 10.5 K/uL 3.5  2.3  2.6   Hemoglobin 13.0 - 17.0 g/dL 62.9  52.8  41.3   Hematocrit 39.0 - 52.0 % 33.4  31.2  34.9  Platelets 150 - 400 K/uL 73  67  82       Latest Ref Rng & Units 11/13/2023    6:09 AM 11/12/2023    5:26 AM 11/11/2023   11:24 PM  BMP  Glucose 70 - 99 mg/dL 61  95  254   BUN 8 - 23 mg/dL 26  29  28    Creatinine 0.61 - 1.24 mg/dL 2.70  6.23  7.62   Sodium 135 - 145 mmol/L 142  139  139   Potassium 3.5 - 5.1 mmol/L 3.7  3.8  4.1   Chloride 98 - 111 mmol/L 113  112  106   CO2 22 - 32 mmol/L 21  21  24    Calcium 8.9 - 10.3 mg/dL 83.1  51.7  61.6    No results found.   Family Communication: Discussed with patient, he understand and agree. All questions answered.  Disposition: Status is: Inpatient Remains  inpatient appropriate because: Encephalopathy, weakness  Planned Discharge Destination: Home with Home Health     Time spent: 38 minutes  Author: Marcelino Duster, MD 11/14/2023 3:05 PM Secure chat 7am to 7pm For on call review [email or web address].

## 2023-11-14 NOTE — ED Notes (Signed)
 Patient continues to remove cardiac monitoring leads throughout 12 hour evening shift. This RN reconnected patient to cardiac monitoring x4 times this evening, despite redirection and instructing patient not to remove leads, patient continued to do so.

## 2023-11-15 ENCOUNTER — Emergency Department
Admission: EM | Admit: 2023-11-15 | Discharge: 2023-11-15 | Disposition: A | Discharge status: Home / Self Care | Attending: Emergency Medicine | Admitting: Emergency Medicine

## 2023-11-15 ENCOUNTER — Other Ambulatory Visit: Payer: Self-pay

## 2023-11-15 DIAGNOSIS — K7460 Unspecified cirrhosis of liver: Secondary | ICD-10-CM

## 2023-11-15 DIAGNOSIS — I10 Essential (primary) hypertension: Secondary | ICD-10-CM

## 2023-11-15 DIAGNOSIS — N1831 Chronic kidney disease, stage 3a: Secondary | ICD-10-CM | POA: Diagnosis not present

## 2023-11-15 DIAGNOSIS — T83098A Other mechanical complication of other indwelling urethral catheter, initial encounter: Secondary | ICD-10-CM | POA: Insufficient documentation

## 2023-11-15 DIAGNOSIS — E782 Mixed hyperlipidemia: Secondary | ICD-10-CM

## 2023-11-15 DIAGNOSIS — G9341 Metabolic encephalopathy: Secondary | ICD-10-CM

## 2023-11-15 DIAGNOSIS — Y732 Prosthetic and other implants, materials and accessory gastroenterology and urology devices associated with adverse incidents: Secondary | ICD-10-CM | POA: Insufficient documentation

## 2023-11-15 DIAGNOSIS — D696 Thrombocytopenia, unspecified: Secondary | ICD-10-CM

## 2023-11-15 DIAGNOSIS — D61818 Other pancytopenia: Secondary | ICD-10-CM

## 2023-11-15 DIAGNOSIS — E119 Type 2 diabetes mellitus without complications: Secondary | ICD-10-CM | POA: Diagnosis not present

## 2023-11-15 DIAGNOSIS — T839XXA Unspecified complication of genitourinary prosthetic device, implant and graft, initial encounter: Secondary | ICD-10-CM

## 2023-11-15 MED ORDER — CHLORHEXIDINE GLUCONATE CLOTH 2 % EX PADS
6.0000 | MEDICATED_PAD | Freq: Every day | CUTANEOUS | Status: DC
  Administered 2023-11-15: 6 via TOPICAL

## 2023-11-15 MED ORDER — ACETAMINOPHEN 325 MG PO TABS
650.0000 mg | ORAL_TABLET | Freq: Three times a day (TID) | ORAL | Status: DC | PRN
  Administered 2023-11-15: 650 mg via ORAL
  Filled 2023-11-15: qty 2

## 2023-11-15 NOTE — ED Notes (Signed)
 Leg bag applied to severed tubing at this time- foley is draining at this time w/ out complication.

## 2023-11-15 NOTE — Care Management Important Message (Signed)
 Important Message  Patient Details  Name: [name] MRN: [id number] Date of Birth: [birth year]   Important Message Given:  Yes - Medicare IM     Cristela Blue, CMA 11/15/2023, 11:25 AM

## 2023-11-15 NOTE — Discharge Summary (Signed)
 Physician Discharge Summary   Patient: [name] MRN: [id number] DOB: November 25, 1950  Admit date:     11/11/2023  Discharge date: 11/15/23  Discharge Physician: Marcelino Duster   PCP: [facility]   Recommendations at discharge:    PCP follow up in 1 week. Discussed with him about the need guardianship in place for future management and hospitalizations.   Discharge Diagnoses: Principal Problem:   Acute metabolic encephalopathy Active Problems:   Diabetes mellitus type 2, noninsulin dependent (HCC)   CKD stage 3a, GFR 45-59 ml/min (HCC) - baseline scr 1.3   Pancytopenia (HCC)   Liver cirrhosis (HCC)   Hyperlipidemia   HTN (hypertension)   Thrombocytopenia (HCC)  Resolved Problems:   * No resolved hospital problems. *  Hospital Course: [name] is a 73 y.o. male with medical history significant for hepatitis C, liver cirrhosis, hepatic encephalopathy recurrent, TIA, chronic HFpEF, CAD, COPD, IIDM, HTN, HLD, sent from group home after he was found unresponsive by the staff at his group home.  Recently hospitalized from 3/6 to 3/13 for hepatic encephalopathy with similar presentation and improvement to baseline with lactulose therapy. Multiple prior admissions noted, palliative team involved for goals of care discussion.  Patient was started on rectal lactulose which is changed to oral lactulose once his mental status improved.  Palliative care discussed with him regarding goals of care.  As his mental status waxes and wanes, unable to do next of kin or power of attorney at this time.  Patient worked with PT OT and is hemodynamically stable to be discharged back to the facility.  Assessment and Plan: [name] is a 73 y.o. male with medical history significant for hepatitis C, liver cirrhosis, hepatic encephalopathy recurrent, TIA, chronic HFpEF, CAD, COPD, IIDM, HTN, HLD, sent from group home after he was found unresponsive by the staff at his group  home.  Recently hospitalized from 3/6 to 3/13 for hepatic encephalopathy with similar presentation and improvement to baseline with lactulose therapy.   Multiple prior admissions noted, palliative team involved for goals of care discussion.   Assessment and Plan: Recurrent hepatic encephalopathy Patient's mental status improved. Able to tolerate Lactulose p.o.. Continue aspiration precautions. Palliative care on board for goals of care discussion. No next of kin on record, and his mental status waxes and wanes. Unable to get NOK at this time. He is tolerating diet, wishes to go back to facility.   Pancytopenia In the setting of liver cirrhosis. No active bleeding. Trend CBC as outpatient.   Sinus bradycardia- Stable. He is not on any beta blocker. Continue telemetry.   Acute kidney injury: Improving. Hold Cozaar, jardiance. Encourage oral diet, supplements.   Hypertension Chronic HFpEF Resumed Lasix, spironolactone as BP, AKI improved.   COPD No acute concerns   Major depression disorder Continue Celexa hydroxyzine as needed and Seroquel as needed.       Consultants: Palliative. Procedures performed: none  Disposition: Home Diet recommendation:  Discharge Diet Orders (From admission, onward)     Start     Ordered   11/15/23 0000  Diet - low sodium heart healthy        11/15/23 1043          Cardiac diet DISCHARGE MEDICATION: Allergies as of 11/15/2023       Reactions   Bee Venom Anaphylaxis   Codeine Itching   Only itching. Recently allergy tested at [facility]   Rsv Mrna Pre-f Virus Vaccine Swelling   Meloxicam  Other Reaction(s): ?overdose of local given at dentist--had trouble breathing   Codeine    Novocain [procaine]    Sulfa Antibiotics Other (See Comments)   Sulfa Antibiotics    hallucinations        Medication List     STOP taking these medications    Jardiance 10 MG Tabs tablet Generic drug: empagliflozin   losartan 50 MG  tablet Commonly known as: COZAAR       TAKE these medications    acetaminophen 650 MG CR tablet Commonly known as: TYLENOL Take 650 mg by mouth every 8 (eight) hours as needed for pain.   aspirin 81 MG chewable tablet Chew 81 mg by mouth daily.   atorvastatin 40 MG tablet Commonly known as: LIPITOR Take 40 mg by mouth daily.   citalopram 20 MG tablet Commonly known as: CELEXA Take 20 mg by mouth daily.   feeding supplement Liqd Take 237 mLs by mouth 3 (three) times daily between meals.   furosemide 20 MG tablet Commonly known as: LASIX Take 1 tablet (20 mg total) by mouth daily.   gabapentin 300 MG capsule Commonly known as: NEURONTIN Take 300 mg by mouth 2 (two) times daily.   lactulose 10 GM/15ML solution Commonly known as: CHRONULAC Take 45 mLs (30 g total) by mouth 3 (three) times daily.   multivitamin with minerals Tabs tablet Take 1 tablet by mouth daily.   nitroGLYCERIN 0.4 MG/SPRAY spray Commonly known as: NITROLINGUAL Place 1 spray under the tongue every 5 (five) minutes x 3 doses as needed for chest pain.   ondansetron 4 MG tablet Commonly known as: ZOFRAN Take 1 tablet (4 mg total) by mouth every 6 (six) hours as needed for nausea.   pantoprazole 40 MG tablet Commonly known as: PROTONIX Take 40 mg by mouth daily.   phenol 1.4 % Liqd Commonly known as: CHLORASEPTIC Use as directed 1 spray in the mouth or throat as needed for throat irritation / pain.   QUEtiapine 25 MG tablet Commonly known as: SEROQUEL Take 25 mg by mouth at bedtime as needed (Agitation/sleep).   senna 8.6 MG Tabs tablet Commonly known as: SENOKOT Take 1 tablet (8.6 mg total) by mouth 2 (two) times daily as needed for mild constipation.   spironolactone 25 MG tablet Commonly known as: ALDACTONE Take 25 mg by mouth daily.   Ventolin HFA 108 (90 Base) MCG/ACT inhaler Generic drug: albuterol Inhale 2 puffs into the lungs every 4 (four) hours as needed for wheezing.    albuterol (2.5 MG/3ML) 0.083% nebulizer solution Commonly known as: PROVENTIL Take 2.5 mg by nebulization every 4 (four) hours as needed for wheezing or shortness of breath.   Xifaxan 550 MG Tabs tablet Generic drug: rifaximin Take 550 mg by mouth 2 (two) times daily.        Discharge Exam: Filed Weights   11/11/23 2311  Weight: 80.4 kg      11/15/2023   11:36 AM 11/15/2023   10:14 AM 11/15/2023    4:45 AM  Vitals with BMI  Systolic 141 111 846  Diastolic 73 65 61  Pulse 49 52 43   General - Elderly weak Caucasian male, no apparent distress HEENT - PERRLA, EOMI, atraumatic head, non tender sinuses. Lung - Clear, basal rales, no rhonchi, wheezes. Heart - S1, S2 heard, no murmurs, rubs, trace pedal edema. Abdomen - Soft, non tender, bowel sounds good Neuro - Alert, awake and oriented, non focal exam. Skin - Warm and dry.  Condition at  discharge: stable  The results of significant diagnostics from this hospitalization (including imaging, microbiology, ancillary and laboratory) are listed below for reference.   Imaging Studies: CT Head Wo Contrast Result Date: 11/12/2023 CLINICAL DATA:  Mental status change, unknown cause, found unresponsive EXAM: CT HEAD WITHOUT CONTRAST TECHNIQUE: Contiguous axial images were obtained from the base of the skull through the vertex without intravenous contrast. RADIATION DOSE REDUCTION: This exam was performed according to the departmental dose-optimization program which includes automated exposure control, adjustment of the mA and/or kV according to patient size and/or use of iterative reconstruction technique. COMPARISON:  11/04/2023 FINDINGS: Brain: No intracranial hemorrhage, mass effect, or evidence of acute infarct. No hydrocephalus. No extra-axial fluid collection. Age-commensurate cerebral atrophy and chronic small vessel ischemic disease. Vascular: No hyperdense vessel. Intracranial arterial calcification. Skull: No fracture or focal  lesion. Sinuses/Orbits: No acute finding. Other: None. IMPRESSION: No acute intracranial abnormality. Electronically Signed   By: Minerva Fester M.D.   On: 11/12/2023 00:04   DG Chest Port 1 View Result Date: 11/11/2023 CLINICAL DATA:  Found unresponsive, confusion EXAM: PORTABLE CHEST 1 VIEW COMPARISON:  10/31/2023 FINDINGS: The heart size and mediastinal contours are within normal limits. Both lungs are clear. The visualized skeletal structures are unremarkable. IMPRESSION: No active disease. Electronically Signed   By: Sharlet Salina M.D.   On: 11/11/2023 23:41   DG Abd 1 View Result Date: 11/04/2023 CLINICAL DATA:  [id number] Encounter for imaging study to confirm nasogastric (NG) tube placement [id number] EXAM: ABDOMEN - 1 VIEW COMPARISON:  10/31/2023 FINDINGS: Nasogastric tube tip is in the left upper abdomen likely in the gastric body region. Left lung is clear. No gross abnormality to the heart and mediastinum. Possible calcifications in the right upper abdomen. IMPRESSION: Nasogastric tube tip is in the stomach. Electronically Signed   By: Richarda Overlie M.D.   On: 11/04/2023 14:30   CT HEAD WO CONTRAST ( ) Result Date: 11/04/2023 CLINICAL DATA:  Mental status change of unknown cause. Hepatic encephalopathy. EXAM: CT HEAD WITHOUT CONTRAST TECHNIQUE: Contiguous axial images were obtained from the base of the skull through the vertex without intravenous contrast. RADIATION DOSE REDUCTION: This exam was performed according to the departmental dose-optimization program which includes automated exposure control, adjustment of the mA and/or kV according to patient size and/or use of iterative reconstruction technique. COMPARISON:  10/31/2023 FINDINGS: Brain: Age related volume loss. No evidence of old or acute focal infarction, mass lesion, hemorrhage, hydrocephalus or extra-axial collection. Vascular: There is atherosclerotic calcification of the major vessels at the base of the brain. Skull: Negative  Sinuses/Orbits: Previous functional endoscopic sinus surgery. No active inflammation. Orbits negative. Other: None IMPRESSION: No acute CT finding. Age related volume loss. Atherosclerotic calcification of the major vessels at the base of the brain. Electronically Signed   By: Paulina Fusi M.D.   On: 11/04/2023 12:22   DG Abd Portable 1 View Result Date: 10/31/2023 CLINICAL DATA:  NG tube position EXAM: PORTABLE ABDOMEN - 1 VIEW limited for tube placement COMPARISON:  X-ray earlier 10/31/2023. FINDINGS: Limited x-ray for tube placement of the upper abdomen has tip overlying the midbody of the stomach but side hole at the GE junction. This could be advanced further into the stomach. Elsewhere surgical clips in the right upper quadrant. Gas seen along nondilated loops of visualized small and large bowel. Scattered colonic stool. Overlapping cardiac leads. IMPRESSION: Limited x-ray for tube placement now has tip of the catheter overlying the mid stomach but the side hole at the  GE junction. This could be advanced further into the stomach. Electronically Signed   By: Karen Kays M.D.   On: 10/31/2023 16:59   DG Abd Portable 1 View Result Date: 10/31/2023 CLINICAL DATA:  NG tube placement EXAM: PORTABLE ABDOMEN - 1 VIEW.  Limited for tube placement COMPARISON:  None Available. FINDINGS: Limited x-ray of the lower chest are upper abdomen demonstrates the enteric tube overlying the mediastinum and curved back upon itself, likely folded in the esophagus. Recommend this be removed and repositioned. In the upper abdomen note is made of moderate diffuse colonic stool. Gas is seen in nondilated loops of bowel. Surgical clips in the right upper quadrant. Overlapping cardiac leads. Critical Value/emergent results were called by telephone at the time of interpretation on 10/31/2023 at 2:13 pm to provider [facility] , who verbally acknowledged these results. IMPRESSION: Limited x-ray of the lower chest are upper abdomen  demonstrates the enteric tube overlying the mediastinum and curved back upon itself, likely folded in the esophagus. Recommend this be removed and repositioned. Electronically Signed   By: Karen Kays M.D.   On: 10/31/2023 14:13   CT Head Wo Contrast Result Date: 10/31/2023 CLINICAL DATA:  Mental status change, unknown cause EXAM: CT HEAD WITHOUT CONTRAST TECHNIQUE: Contiguous axial images were obtained from the base of the skull through the vertex without intravenous contrast. RADIATION DOSE REDUCTION: This exam was performed according to the departmental dose-optimization program which includes automated exposure control, adjustment of the mA and/or kV according to patient size and/or use of iterative reconstruction technique. COMPARISON:  10/21/2023 FINDINGS: Brain: No acute intracranial abnormality. Specifically, no hemorrhage, hydrocephalus, mass lesion, acute infarction, or significant intracranial injury. Vascular: No hyperdense vessel or unexpected calcification. Skull: No acute calvarial abnormality. Sinuses/Orbits: No acute findings Other: None IMPRESSION: No acute intracranial abnormality. Electronically Signed   By: Charlett Nose M.D.   On: 10/31/2023 11:43   DG Chest Port 1 View Result Date: 10/31/2023 CLINICAL DATA:  Altered mental status EXAM: PORTABLE CHEST 1 VIEW COMPARISON:  09/30/2023 FINDINGS: The heart size and mediastinal contours are within normal limits. Both lungs are clear. The visualized skeletal structures are unremarkable. IMPRESSION: No active disease. Electronically Signed   By: Charlett Nose M.D.   On: 10/31/2023 11:43   CT Head Wo Contrast Result Date: 10/21/2023 CLINICAL DATA:  Mental status change, unknown cause. EXAM: CT HEAD WITHOUT CONTRAST TECHNIQUE: Contiguous axial images were obtained from the base of the skull through the vertex without intravenous contrast. RADIATION DOSE REDUCTION: This exam was performed according to the departmental dose-optimization program which  includes automated exposure control, adjustment of the mA and/or kV according to patient size and/or use of iterative reconstruction technique. COMPARISON:  CT head without contrast 09/30/2023 FINDINGS: Brain: No acute infarct, hemorrhage, or mass lesion is present. Mild atrophy and white matter changes are similar the prior exam. The ventricles are of normal size. No significant extraaxial fluid collection is present. The brainstem and cerebellum are within normal limits. Midline structures are within normal limits. Vascular: No hyperdense vessel or unexpected calcification. Skull: Calvarium is intact. No focal lytic or blastic lesions are present. No significant extracranial soft tissue lesion is present. Sinuses/Orbits: The paranasal sinuses and mastoid air cells are clear. The globes and orbits are within normal limits. IMPRESSION: 1. No acute intracranial abnormality or significant interval change. 2. Mild atrophy and white matter disease likely reflects the sequela of chronic microvascular ischemia. Electronically Signed   By: Virl Son.D.  On: 10/21/2023 19:40    Microbiology: Results for orders placed or performed during the hospital encounter of 10/31/23  Culture, blood (routine x 2)     Status: None   Collection Time: 10/31/23  9:54 AM   Specimen: BLOOD  Result Value Ref Range Status   Specimen Description BLOOD BLOOD LEFT ARM  Final   Special Requests   Final    BOTTLES DRAWN AEROBIC ONLY Blood Culture results may not be optimal due to an inadequate volume of blood received in culture bottles   Culture   Final    NO GROWTH 5 DAYS Performed at [facility], [address] [place], [place] [address]    Report Status 11/05/2023 FINAL  Final  Culture, blood (routine x 2)     Status: None   Collection Time: 10/31/23  9:54 AM   Specimen: BLOOD  Result Value Ref Range Status   Specimen Description BLOOD BLOOD RIGHT ARM  Final   Special Requests   Final    BOTTLES  DRAWN AEROBIC AND ANAEROBIC Blood Culture results may not be optimal due to an inadequate volume of blood received in culture bottles   Culture   Final    NO GROWTH 5 DAYS Performed at [facility], [address] [place], [place] [address]    Report Status 11/05/2023 FINAL  Final  Resp panel by RT-PCR (RSV, Flu A&B, Covid) Anterior Nasal Swab     Status: None   Collection Time: 10/31/23 10:10 AM   Specimen: Anterior Nasal Swab  Result Value Ref Range Status   SARS Coronavirus 2 by RT PCR NEGATIVE NEGATIVE Final    Comment: (NOTE) SARS-CoV-2 target nucleic acids are NOT DETECTED.  The SARS-CoV-2 RNA is generally detectable in upper respiratory specimens during the acute phase of infection. The lowest concentration of SARS-CoV-2 viral copies this assay can detect is 138 copies/mL. A negative result does not preclude SARS-Cov-2 infection and should not be used as the sole basis for treatment or other patient management decisions. A negative result may occur with  improper specimen collection/handling, submission of specimen other than nasopharyngeal swab, presence of viral mutation(s) within the areas targeted by this assay, and inadequate number of viral copies(<138 copies/mL). A negative result must be combined with clinical observations, patient history, and epidemiological information. The expected result is Negative.  Fact Sheet for Patients:  BloggerCourse.com  Fact Sheet for Healthcare Providers:  SeriousBroker.it  This test is no t yet approved or cleared by the [place] FDA and  has been authorized for detection and/or diagnosis of SARS-CoV-2 by FDA under an Emergency Use Authorization (EUA). This EUA will remain  in effect (meaning this test can be used) for the duration of the COVID-19 declaration under Section 564(b)(1) of the Act, 21 U.S.C.section 360bbb-3(b)(1), unless the authorization is terminated   or revoked sooner.       Influenza A by PCR NEGATIVE NEGATIVE Final   Influenza B by PCR NEGATIVE NEGATIVE Final    Comment: (NOTE) The Xpert Xpress SARS-CoV-2/FLU/RSV plus assay is intended as an aid in the diagnosis of influenza from Nasopharyngeal swab specimens and should not be used as a sole basis for treatment. Nasal washings and aspirates are unacceptable for Xpert Xpress SARS-CoV-2/FLU/RSV testing.  Fact Sheet for Patients: BloggerCourse.com  Fact Sheet for Healthcare Providers: SeriousBroker.it  This test is not yet approved or cleared by the [place] FDA and has been authorized for detection and/or diagnosis of SARS-CoV-2 by FDA under an  Emergency Use Authorization (EUA). This EUA will remain in effect (meaning this test can be used) for the duration of the COVID-19 declaration under Section 564(b)(1) of the Act, 21 U.S.C. section 360bbb-3(b)(1), unless the authorization is terminated or revoked.     Resp Syncytial Virus by PCR NEGATIVE NEGATIVE Final    Comment: (NOTE) Fact Sheet for Patients: BloggerCourse.com  Fact Sheet for Healthcare Providers: SeriousBroker.it  This test is not yet approved or cleared by the [place] FDA and has been authorized for detection and/or diagnosis of SARS-CoV-2 by FDA under an Emergency Use Authorization (EUA). This EUA will remain in effect (meaning this test can be used) for the duration of the COVID-19 declaration under Section 564(b)(1) of the Act, 21 U.S.C. section 360bbb-3(b)(1), unless the authorization is terminated or revoked.  Performed at [facility], [address] [place], [place] [address]     Labs: CBC: Recent Labs  Lab 11/11/23 2324 11/12/23 0526 11/13/23 0609  WBC 2.6* 2.3* 3.5*  NEUTROABS 1.0*  --   --   HGB 11.9* 10.7* 11.4*  HCT 34.9* 31.2* 33.4*  MCV 93.6 95.1 93.3  PLT  82* 67* 73*   Basic Metabolic Panel: Recent Labs  Lab 11/11/23 2324 11/12/23 0526 11/13/23 0609  NA 139 139 142  K 4.1 3.8 3.7  CL 106 112* 113*  CO2 24 21* 21*  GLUCOSE 100* 95 61*  BUN 28* 29* 26*  CREATININE 1.64* 1.48* 1.28*  CALCIUM 10.9* 10.3 10.1   Liver Function Tests: Recent Labs  Lab 11/11/23 2324 11/12/23 0526  AST 47* 39  ALT 34 31  ALKPHOS 70 53  BILITOT 1.4* 1.4*  PROT 7.0 6.0*  ALBUMIN 3.0* 2.5*   CBG: Recent Labs  Lab 11/13/23 1347 11/13/23 1548  GLUCAP 93 96    Discharge time spent: 35 minutes.  Signed: Marcelino Duster, MD [facility] 11/15/2023

## 2023-11-15 NOTE — ED Notes (Signed)
 Patient Care Tech provided patient with food and water at this time.

## 2023-11-15 NOTE — Evaluation (Signed)
 Occupational Therapy Evaluation Patient Details Name: [name] MRN: [id number] DOB: 1951-02-04 Today's Date: 11/15/2023   History of Present Illness   As per EMR: [name] is a 73 y.o. male with medical history significant for hepatitis C, liver cirrhosis, hepatic encephalopathy recurrent, TIA, chronic HFpEF, CAD, COPD, IIDM, HTN, HLD, sent from group home after he was found unresponsive by the staff at his group home.  Recently hospitalized from 3/6 to 3/13 for hepatic encephalopathy with similar presentation and improvement to baseline with lactulose therapy.     Multiple prior admissions noted, palliative team involved for goals of care discussion.   Clinical Impressions Pt was seen for OT evaluation this date. Prior to hospital admission, pt was living at a group home, multiple recent admissions. Pt presents to acute OT demonstrating impaired ADL performance and functional mobility 2/2 decreased strength, balance, activity tolerance, and safety awareness (See OT problem list for additional functional deficits). Pt currently requires CGA for ADL mobility with RW, CGA for toilet transfers, Supv for seated pericare with VC for thoroughness, MIN A for clothing mgt, and VC for safety/sequencing as pt attempted to use hand soap and then paper towel to complete pericare in standing at the sink.  Pt would benefit from skilled OT services to address noted impairments and functional limitations (see below for any additional details) in order to maximize safety and independence while minimizing falls risk and caregiver burden.    If plan is discharge home, recommend the following:   Assistance with feeding;Assist for transportation;Direct supervision/assist for medications management;Help with stairs or ramp for entrance;Supervision due to cognitive status;Direct supervision/assist for financial management;Assistance with cooking/housework;A lot of help with bathing/dressing/bathroom      Functional Status Assessment   Patient has had a recent decline in their functional status and demonstrates the ability to make significant improvements in function in a reasonable and predictable amount of time.     Equipment Recommendations   Other (comment) (defer)     Recommendations for Other Services         Precautions/Restrictions   Precautions Precautions: Fall Recall of Precautions/Restrictions: Impaired Restrictions Weight Bearing Restrictions Per Provider Order: No     Mobility Bed Mobility               General bed mobility comments: NT in recliner    Transfers Overall transfer level: Needs assistance Equipment used: Rolling walker (2 wheels) Transfers: Sit to/from Stand Sit to Stand: Contact guard assist                  Balance Overall balance assessment: Needs assistance Sitting-balance support: Feet supported       Standing balance support: No upper extremity supported, During functional activity Standing balance-Leahy Scale: Fair                             ADL either performed or assessed with clinical judgement   ADL Overall ADL's : Needs assistance/impaired     Grooming: Wash/dry hands;Contact guard assist;Standing                   Toilet Transfer: Contact guard assist;Cueing for safety;Cueing for sequencing;Rolling walker (2 wheels);Regular Toilet   Toileting- Clothing Manipulation and Hygiene: Contact guard assist;Minimal assistance;Sit to/from stand Toileting - Clothing Manipulation Details (indicate cue type and reason): MIN A for clothing mgt, VC for thoroughness wiht pericare     Functional mobility during ADLs: Contact guard assist;Rolling  walker (2 wheels)       Vision         Perception         Praxis         Pertinent Vitals/Pain Pain Assessment Pain Assessment: 0-10 Pain Score: 7  Pain Location: headache Pain Descriptors / Indicators: Headache Pain Intervention(s):  Limited activity within patient's tolerance, Monitored during session, Repositioned, Patient requesting pain meds-RN notified     Extremity/Trunk Assessment Upper Extremity Assessment Upper Extremity Assessment: Difficult to assess due to impaired cognition   Lower Extremity Assessment Lower Extremity Assessment: Generalized weakness       Communication Communication Communication: No apparent difficulties Factors Affecting Communication: Difficulty expressing self   Cognition Arousal: Alert Behavior During Therapy: WFL for tasks assessed/performed Cognition: History of cognitive impairments                               Following commands: Impaired Following commands impaired: Only follows one step commands consistently     Cueing  General Comments   Cueing Techniques: Verbal cues;Gestural cues;Tactile cues;Visual cues      Exercises     Shoulder Instructions      Home Living Family/patient expects to be discharged to:: Group home Living Arrangements: Group Home Available Help at Discharge: Personal care attendant Type of Home: Group Home Home Access: Ramped entrance     Home Layout: One level     Bathroom Shower/Tub: Producer, television/film/video: Handicapped height     Home Equipment: Agricultural consultant (2 wheels);Educational psychologist (4 wheels)          Prior Functioning/Environment Prior Level of Function : Needs assist;History of Falls (last six months)             Mobility Comments: Per recent hospitalization notes, pt was modified independent ambulating limtied community distances with RW; h/o falls ADLs Comments: Per recent hospitalization notes, group home staff assists with ADL's    OT Problem List: Decreased coordination;Decreased cognition;Decreased safety awareness;Decreased knowledge of use of DME or AE;Decreased strength;Decreased activity tolerance;Impaired balance (sitting and/or standing);Decreased knowledge of  precautions;Pain   OT Treatment/Interventions: Self-care/ADL training;Therapeutic activities;DME and/or AE instruction;Cognitive remediation/compensation;Energy conservation;Balance training;Therapeutic exercise;Neuromuscular education;Patient/family education      OT Goals(Current goals can be found in the care plan section)   Acute Rehab OT Goals Patient Stated Goal: go back to group home OT Goal Formulation: With patient Time For Goal Achievement: 11/29/23 Potential to Achieve Goals: Good ADL Goals Pt Will Perform Lower Body Dressing: with modified independence;sit to/from stand Pt Will Transfer to Toilet: with supervision;ambulating (LRAD) Pt Will Perform Toileting - Clothing Manipulation and hygiene: with supervision;sitting/lateral leans   OT Frequency:  Min 1X/week    Co-evaluation              AM-PAC OT "6 Clicks" Daily Activity     Outcome Measure Help from another person eating meals?: None Help from another person taking care of personal grooming?: A Little Help from another person toileting, which includes using toliet, bedpan, or urinal?: A Little Help from another person bathing (including washing, rinsing, drying)?: A Lot Help from another person to put on and taking off regular upper body clothing?: A Little Help from another person to put on and taking off regular lower body clothing?: A Lot 6 Click Score: 17   End of Session Equipment Utilized During Treatment: Rolling walker (2 wheels) Nurse Communication: Patient requests pain meds  Activity Tolerance: Patient tolerated treatment well Patient left: in chair;with call bell/phone within reach;with chair alarm set  OT Visit Diagnosis: Unsteadiness on feet (R26.81);Other abnormalities of gait and mobility (R26.89);Muscle weakness (generalized) (M62.81);Other symptoms and signs involving cognitive function                Time: 6962-9528 OT Time Calculation (min): 22 min Charges:  OT General Charges $OT  Visit: 1 Visit OT Evaluation $OT Eval Low Complexity: 1 Low OT Treatments $Self Care/Home Management : 8-22 mins  Arman Filter., MPH, MS, OTR/L ascom [phone] 11/15/23, 11:24 AM

## 2023-11-15 NOTE — ED Triage Notes (Signed)
 Per EMS- staff reported that pt used scissors to cut tubing on foley bag. Pt states "I don't need it." Pt is alert, oriented to self and situation only. Pt denies pain. 129/56 100% RA 63 Dementia and seizure history  [address] [place]- pt group home location, name unknown.

## 2023-11-15 NOTE — Evaluation (Signed)
 Physical Therapy Evaluation Patient Details Name: INRI SOBIESKI MRN: [id number] DOB: Aug 04, 1951 Today's Date: 11/15/2023  History of Present Illness  As per EMR: JERMARIO KALMAR is a 73 y.o. male with medical history significant for hepatitis C, liver cirrhosis, hepatic encephalopathy recurrent, TIA, chronic HFpEF, CAD, COPD, IIDM, HTN, HLD, sent from group home after he was found unresponsive by the staff at his group home.  Recently hospitalized from 3/6 to 3/13 for hepatic encephalopathy with similar presentation and improvement to baseline with lactulose therapy.     Multiple prior admissions noted, palliative team involved for goals of care discussion.  Clinical Impression  Pt received in bed agreeable to PT evaluation. Pt alert to self and place.Follows One step commands with continued VC for direction and safety. Pt stated desire to return to the Group home. PT assessment revealed pt needs  CGA to SBA for Ambulation and standing activities  using FWW. Pt has permanent indwelling Foley cath. Pt Independent with bed mobility. Pt STS with Sup. Pt referred to mobility specialist while in acute to improve mobility and functional independence. Pt left in chair without distress. BP 129/55 HR 49 ti 53 through out the session and asymptomatic. PT will continue in acute and pt will benefit from 3 x week continued PT at group home in order to return to PLOF safely. .       If plan is discharge home, recommend the following: A little help with walking and/or transfers;A little help with bathing/dressing/bathroom;Assistance with cooking/housework;Direct supervision/assist for medications management;Assist for transportation;Help with stairs or ramp for entrance   Can travel by private vehicle   No    Equipment Recommendations None recommended by PT  Recommendations for Other Services       Functional Status Assessment Patient has had a recent decline in their functional status and demonstrates the  ability to make significant improvements in function in a reasonable and predictable amount of time.     Precautions / Restrictions Precautions Precautions: Fall      Mobility  Bed Mobility Overal bed mobility: Independent             General bed mobility comments: needs VC to initiate the task.    Transfers Overall transfer level: Needs assistance Equipment used: Rolling walker (2 wheels) Transfers: Sit to/from Stand, Bed to chair/wheelchair/BSC Sit to Stand: Contact guard assist   Step pivot transfers: Contact guard assist       General transfer comment: needs VC for direction.    Ambulation/Gait Ambulation/Gait assistance: Contact guard assist Gait Distance (Feet): 40 Feet Assistive device: Rolling walker (2 wheels) Gait Pattern/deviations: Step-through pattern Gait velocity: decreased     General Gait Details: steady ambulation with RW use; stops and needs VC fro directions due to disorientation to place.  Stairs            Wheelchair Mobility     Tilt Bed    Modified Rankin (Stroke Patients Only)       Balance Overall balance assessment: Needs assistance   Sitting balance-Leahy Scale: Good     Standing balance support: Bilateral upper extremity supported Standing balance-Leahy Scale: Good Standing balance comment: No LOB noted, needs FWW  due to Indwelling Foley                             Pertinent Vitals/Pain Pain Assessment Pain Assessment: No/denies pain    Home Living Family/patient expects to be discharged to:: Group  home Living Arrangements: Group Home Available Help at Discharge: Personal care attendant Type of Home: Group Home Home Access: Ramped entrance       Home Layout: One level Home Equipment: Rolling Walker (2 wheels);Educational psychologist (4 wheels)      Prior Function Prior Level of Function : Needs assist;History of Falls (last six months)             Mobility Comments: Per recent  hospitalization notes, pt was modified independent ambulating limtied community distances with RW; h/o falls ADLs Comments: Per recent hospitalization notes, group home staff assists with ADL's     Extremity/Trunk Assessment   Upper Extremity Assessment Upper Extremity Assessment: Overall WFL for tasks assessed    Lower Extremity Assessment Lower Extremity Assessment: Generalized weakness       Communication   Communication Communication: No apparent difficulties    Cognition Arousal: Alert Behavior During Therapy: WFL for tasks assessed/performed   PT - Cognitive impairments: History of cognitive impairments                       PT - Cognition Comments: Pt oriented to name and hospital (disorietec to TIme and situation) Following commands: Impaired Following commands impaired: Only follows one step commands consistently     Cueing Cueing Techniques: Verbal cues, Gestural cues, Tactile cues, Visual cues     General Comments      Exercises     Assessment/Plan    PT Assessment Patient needs continued PT services  PT Problem List         PT Treatment Interventions Gait training;Therapeutic activities;Therapeutic exercise;Balance training;Cognitive remediation;Patient/family education    PT Goals (Current goals can be found in the Care Plan section)  Acute Rehab PT Goals Patient Stated Goal: TO return to group home. PT Goal Formulation: With patient Time For Goal Achievement: 11/29/23 Potential to Achieve Goals: Good    Frequency Min 2X/week     Co-evaluation               AM-PAC PT "6 Clicks" Mobility  Outcome Measure Help needed turning from your back to your side while in a flat bed without using bedrails?: None Help needed moving from lying on your back to sitting on the side of a flat bed without using bedrails?: None Help needed moving to and from a bed to a chair (including a wheelchair)?: A Little Help needed standing up from a  chair using your arms (e.g., wheelchair or bedside chair)?: A Little Help needed to walk in hospital room?: A Little Help needed climbing 3-5 steps with a railing? : A Little 6 Click Score: 20    End of Session Equipment Utilized During Treatment: Gait belt Activity Tolerance: Patient tolerated treatment well Patient left: in chair;with call bell/phone within reach;with chair alarm set Nurse Communication: Mobility status PT Visit Diagnosis: Other abnormalities of gait and mobility (R26.89);Muscle weakness (generalized) (M62.81);History of falling (Z91.81);Pain    Time: 9629-5284 PT Time Calculation (min) (ACUTE ONLY): 20 min   Charges:   PT Evaluation $PT Eval Low Complexity: 1 Low   PT General Charges $$ ACUTE PT VISIT: 1 Visit        Krizia Flight PT DPT 9:51 AM,11/15/23

## 2023-11-15 NOTE — ED Notes (Signed)
 New leg bag applied to foley at this time per providers orders.

## 2023-11-15 NOTE — ED Notes (Signed)
 Called [facility] @10 :30 pm spoke with rep.Onalee Hua gave rep. All pertinent information. Rep stated he put the pt on the list and it would be about an hour until transport arrived.

## 2023-11-15 NOTE — TOC Transition Note (Addendum)
 Transition of Care [facility]) - Discharge Note   Patient Details  Name: [name] MRN: [id number] Date of Birth: Jan 03, 1951  [facility] CM/SW Contact:  Cherre Blanc, RN Phone Number: 11/15/2023, 10:56 AM   Clinical Narrative:     Patient is medically clear for dc to group home w/ [facility] PT/OT. TOC spoke with Cheryll and [facility] will resume HH. TOC spoke with Doristine Mango [phone] at the patient's group home and he is in agreement with the patient's dc plan. No other dc needs identified.   Final next level of care: Group Home (w/ HH P/T/OT) Barriers to Discharge: No Barriers Identified   Patient Goals and CMS Choice            Discharge Placement                       Discharge Plan and Services Additional resources added to the After Visit Summary for     Discharge Planning Services: CM Consult                                 Social Drivers of Health (SDOH) Interventions SDOH Screenings   Food Insecurity: No Food Insecurity (11/14/2023)  Housing: Low Risk  (11/14/2023)  Transportation Needs: No Transportation Needs (11/14/2023)  Utilities: Not At Risk (11/14/2023)  Alcohol Screen: Low Risk  (09/24/2022)  Depression (PHQ2-9): High Risk (04/26/2022)  Financial Resource Strain: High Risk (04/27/2022)  Physical Activity: Sufficiently Active (04/16/2018)  Social Connections: Unknown (11/14/2023)  Stress: Stress Concern Present (04/16/2018)  Tobacco Use: High Risk (10/31/2023)     Readmission Risk Interventions    11/13/2023    2:21 PM 11/12/2023    4:00 PM 06/18/2023   12:36 PM  Readmission Risk Prevention Plan  Transportation Screening  Not Complete Complete  Medication Review Oceanographer) Complete Not Complete Complete  PCP or Specialist appointment within 3-5 days of discharge Complete Complete Complete  HRI or Home Care Consult Complete  Complete  SW Recovery Care/Counseling Consult Complete  Not Complete  Palliative Care Screening  Complete  Not Complete  Skilled Nursing Facility Not Applicable  Complete

## 2023-11-15 NOTE — [place]2 (Signed)
 [place] MEDICAID FL2 LEVEL OF CARE FORM     IDENTIFICATION  Patient Name: [name] Birthdate: [birth year] Sex: male Admission Date (Current Location): 11/11/2023  [place] and [place] Number:  Chiropodist and Address:  [facility], [address], [place], [place] [address]      Provider Number: [id number]  Attending Physician Name and Address:  Marcelino Duster, MD  Relative Name and Phone Number:       Current Level of Care: Hospital Recommended Level of Care: [facility] Prior Approval Number:    Date Approved/Denied:   PASRR Number: [id number]  Discharge Plan: Other (Comment) (Family Care Home)    Current Diagnoses: Patient Active Problem List   Diagnosis Date Noted   Altered mental status 11/12/2023   Type II diabetes mellitus with renal manifestations (HCC) 10/21/2023   Chronic kidney disease, stage 3a (HCC) 10/21/2023   Myocardial injury 10/21/2023   CKD stage 3a, GFR 45-59 ml/min (HCC) - baseline scr 1.3 10/01/2023   Hepatic encephalopathy (HCC) 09/30/2023   Liver cirrhosis (HCC) 09/30/2023   Essential hypertension 09/30/2023   Diabetes mellitus type 2, noninsulin dependent (HCC) 09/30/2023   CAD (coronary artery disease) 09/30/2023   Hyperlipidemia 09/30/2023   Hepatic encephalopathy (HCC) 08/01/2023   Hyperammonemia (HCC) 07/30/2023   Cervical spinal stenosis 07/01/2023   Cervical myelopathy (HCC) 06/21/2023   Cervical stenosis of spine 06/21/2023   Cervical radiculopathy 06/20/2023   E coli bacteremia 06/17/2023   Left leg cellulitis 03/23/2023   Leukocytosis 03/23/2023   Serum total bilirubin elevated 03/23/2023   Left leg pain 03/23/2023   Suicidal ideation 01/14/2023   Acute pain 01/14/2023   Adrenal insufficiency (HCC) 12/26/2022   Orthostatic hypotension 12/24/2022   Rib pain on right side 12/24/2022   Hepatitis B infection 12/24/2022   Myocardial ischemia 12/24/2022   Acute appendicitis  11/05/2022   Pancytopenia (HCC) 11/05/2022   Sinus bradycardia 11/05/2022   Acute metabolic encephalopathy 09/22/2022   Liver lesion 09/22/2022   Hyperlipidemia, unspecified 09/22/2022   Type 2 diabetes mellitus with hyperlipidemia (HCC) 09/22/2022   Obesity (BMI 30-39.9) 09/22/2022   Acute hepatic encephalopathy (HCC) 09/22/2022   MDD (major depressive disorder), recurrent severe, 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   Major depressive disorder, 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 psychotic features (HCC) 09/15/2022   Protein-calorie malnutrition, severe (HCC) 08/31/2022   Hypotension 08/31/2022   Weakness    Hx of transient ischemic attack (TIA) 04/28/2022   Obesity    Chronic diastolic CHF (congestive heart failure) (HCC)    Depression    Sepsis (HCC) 03/28/2022   Bacteremia due to Klebsiella pneumoniae 03/28/2022   Hypoglycemia 03/28/2022   Syncope 03/24/2022   Hypokalemia 03/24/2022   Fever 03/24/2022   COPD (chronic obstructive pulmonary disease) (HCC)    Elevated troponin    Abnormal LFTs    History of hepatitis C    AKI (acute kidney injury) (HCC)    Pure hypercholesterolemia    Obesity, Class III, BMI 40-49.9 (morbid obesity) (HCC) 04/18/2020   Thrombocytopenia (HCC) 04/17/2020   Leukopenia 04/17/2020   HTN (hypertension) 04/17/2018   Uncontrolled type 2 diabetes mellitus with hyperglycemia, without long-term current use of insulin (HCC) 04/17/2018   Lymphedema 04/17/2018   Normocytic anemia 04/07/2018   Chest pain 03/20/2018   Unsheltered homelessness 12/16/2017    Orientation RESPIRATION BLADDER Height & Weight     Self, Place  Normal Incontinent Weight: 80.4 kg Height:  5\' 6"  (167.6 cm)  BEHAVIORAL SYMPTOMS/MOOD NEUROLOGICAL BOWEL NUTRITION STATUS  Incontinent Diet (Low NA Heart Healthy Diet)  AMBULATORY STATUS COMMUNICATION OF NEEDS Skin   Limited Assist Verbally  (generalized erythema and redness)                       Personal Care Assistance Level of  Assistance  Bathing, Feeding, Dressing Bathing Assistance: Limited assistance Feeding assistance: Limited assistance Dressing Assistance: Limited assistance     Functional Limitations Info  Sight, Hearing, Speech   Hearing Info: Adequate Speech Info: Adequate    SPECIAL CARE FACTORS FREQUENCY                       Contractures      Additional Factors Info  Code Status Code Status Info: DNR Allergies Info: Bee Venom, COdeine, Rsv Mrna Pre-f Virus Vaccine, Meloxicam, Codeine, Novocaine (Procaine), Sulfa Antibiotics              Discharge Medications: Current Medication List-Note: Take only these medications. Stop taking all medications not included on this list. If you have any questions regarding medications not on this list, please follow up with your healthcare provider. Bring this list to your next appointment.    Medication Details Next Dose Due Morning Afternoon Evening Bedtime As Needed  acetaminophen 650 MG CR tablet Commonly known as: TYLENOL Take 650 mg by mouth every 8 (eight) hours as needed for pain.         aspirin 81 MG chewable tablet Chew 81 mg by mouth daily.         atorvastatin 40 MG tablet Commonly known as: LIPITOR Take 40 mg by mouth daily.         citalopram 20 MG tablet Commonly known as: CELEXA Take 20 mg by mouth daily.         feeding supplement Liqd Take 237 mLs by mouth 3 (three) times daily between meals.         furosemide 20 MG tablet Commonly known as: LASIX Take 1 tablet (20 mg total) by mouth daily.         gabapentin 300 MG capsule Commonly known as: NEURONTIN Take 300 mg by mouth 2 (two) times daily.         lactulose 10 GM/15ML solution Commonly known as: CHRONULAC Take 45 mLs (30 g total) by mouth 3 (three) times daily.         multivitamin with minerals Tabs tablet Take 1 tablet by mouth daily.         nitroGLYCERIN 0.4 MG/SPRAY spray Commonly known as: NITROLINGUAL Place 1 spray under the tongue every 5 (five)  minutes x 3 doses as needed for chest pain.         ondansetron 4 MG tablet Commonly known as: ZOFRAN Take 1 tablet (4 mg total) by mouth every 6 (six) hours as needed for nausea.         pantoprazole 40 MG tablet Commonly known as: PROTONIX Take 40 mg by mouth daily.         phenol 1.4 % Liqd Commonly known as: CHLORASEPTIC Use as directed 1 spray in the mouth or throat as needed for throat irritation / pain.         QUEtiapine 25 MG tablet Commonly known as: SEROQUEL Take 25 mg by mouth at bedtime as needed (Agitation/sleep).         senna 8.6 MG Tabs tablet Commonly known as: SENOKOT Take 1 tablet (8.6 mg total) by mouth 2 (two) times  daily as needed for mild constipation.         spironolactone 25 MG tablet Commonly known as: ALDACTONE Take 25 mg by mouth daily.         * Ventolin HFA 108 (90 Base) MCG/ACT inhaler Inhale 2 puffs into the lungs every 4 (four) hours as needed for wheezing. Generic drug: albuterol         * albuterol (2.5 MG/3ML) 0.083% nebulizer solution Commonly known as: PROVENTIL Take 2.5 mg by nebulization every 4 (four) hours as needed for wheezing or shortness of breath.         Xifaxan 550 MG Tabs tablet Take 550 mg by mouth 2 (two) times daily. Generic drug: rifaximin           Relevant Imaging Results:  Relevant Lab Results:   Additional Information [id number]  Cherre Blanc, RN

## 2023-11-15 NOTE — ED Notes (Addendum)
 Bed alarm in place. Fall risk bracelet on.

## 2023-11-15 NOTE — ED Provider Notes (Signed)
   [facility] Provider Note    Event Date/Time   First MD Initiated Contact with Patient 11/15/23 2052     (approximate)   History   Foley catheter problem   HPI  [name] is a 73 y.o. male who presents to the emergency department today after cutting the tubing on his Foley catheter.  Patient was discharged from the hospital earlier today.  On exam it does appear that the Foley catheter tubing has been cut. To be clear, the rubber catheter itself is intact, the clear plastic tubing attached to it has been cut.     Physical Exam   Triage Vital Signs: ED Triage Vitals  Encounter Vitals Group     BP 11/15/23 2028 (!) 102/58     Systolic BP Percentile --      Diastolic BP Percentile --      Pulse Rate 11/15/23 2028 62     Resp 11/15/23 2028 17     Temp 11/15/23 2028 97.8 F (36.6 C)     Temp Source 11/15/23 2028 Oral     SpO2 11/15/23 2028 100 %     Weight --      Height --      Head Circumference --      Peak Flow --      Pain Score 11/15/23 2026 0     Pain Loc --      Pain Education --      Exclude from Growth Chart --     Most recent vital signs: Vitals:   11/15/23 2028  BP: (!) 102/58  Pulse: 62  Resp: 17  Temp: 97.8 F (36.6 C)  SpO2: 100%   General: Awake, alert. CV:  Good peripheral perfusion.  Resp:  Normal effort.  Abd:  No distention.  Other:  Indwelling foley catheter in place.   ED Results / Procedures / Treatments   Labs (all labs ordered are listed, but only abnormal results are displayed) Labs Reviewed - No data to display   EKG  None   RADIOLOGY None  PROCEDURES:  Critical Care performed: No    MEDICATIONS ORDERED IN ED: Medications - No data to display   IMPRESSION / MDM / ASSESSMENT AND PLAN / ED COURSE  I reviewed the triage vital signs and the nursing notes.                              Patient presents to the emergency department after cutting foley catheter tubing. Tubing was  replaced by nursing staff.       FINAL CLINICAL IMPRESSION(S) / ED DIAGNOSES   Final diagnoses:  Problem with Foley catheter, initial encounter [facility])       Note:  This document was prepared using Dragon voice recognition software and may include unintentional dictation errors.    Phineas Semen, MD 11/15/23 2206

## 2023-11-15 NOTE — ED Notes (Signed)
 Report called to staff at [facility] [phone])

## 2023-11-15 NOTE — Plan of Care (Signed)
  Problem: Education: Goal: Knowledge of General Education information will improve Description: Including pain rating scale, medication(s)/side effects and non-pharmacologic comfort measures Outcome: Progressing   Problem: Clinical Measurements: Goal: Ability to maintain clinical measurements within normal limits will improve Outcome: Progressing   Problem: Nutrition: Goal: Adequate nutrition will be maintained Outcome: Progressing   Problem: Pain Managment: Goal: General experience of comfort will improve and/or be controlled Outcome: Progressing

## 2023-11-21 ENCOUNTER — Other Ambulatory Visit: Payer: Self-pay

## 2023-11-21 ENCOUNTER — Observation Stay
Admission: EM | Admit: 2023-11-21 | Discharge: 2023-11-23 | Disposition: A | Discharge status: Home / Self Care | Attending: Internal Medicine | Admitting: Internal Medicine

## 2023-11-21 ENCOUNTER — Encounter: Payer: Self-pay | Admitting: Emergency Medicine

## 2023-11-21 ENCOUNTER — Emergency Department

## 2023-11-21 DIAGNOSIS — R001 Bradycardia, unspecified: Secondary | ICD-10-CM | POA: Insufficient documentation

## 2023-11-21 DIAGNOSIS — R1310 Dysphagia, unspecified: Secondary | ICD-10-CM | POA: Insufficient documentation

## 2023-11-21 DIAGNOSIS — R531 Weakness: Secondary | ICD-10-CM | POA: Diagnosis not present

## 2023-11-21 DIAGNOSIS — E876 Hypokalemia: Secondary | ICD-10-CM | POA: Diagnosis present

## 2023-11-21 DIAGNOSIS — Z7982 Long term (current) use of aspirin: Secondary | ICD-10-CM | POA: Insufficient documentation

## 2023-11-21 DIAGNOSIS — I2510 Atherosclerotic heart disease of native coronary artery without angina pectoris: Secondary | ICD-10-CM | POA: Diagnosis not present

## 2023-11-21 DIAGNOSIS — Z79899 Other long term (current) drug therapy: Secondary | ICD-10-CM | POA: Insufficient documentation

## 2023-11-21 DIAGNOSIS — K7460 Unspecified cirrhosis of liver: Secondary | ICD-10-CM | POA: Diagnosis not present

## 2023-11-21 DIAGNOSIS — I5032 Chronic diastolic (congestive) heart failure: Secondary | ICD-10-CM | POA: Diagnosis not present

## 2023-11-21 DIAGNOSIS — E11649 Type 2 diabetes mellitus with hypoglycemia without coma: Secondary | ICD-10-CM | POA: Diagnosis not present

## 2023-11-21 DIAGNOSIS — F17210 Nicotine dependence, cigarettes, uncomplicated: Secondary | ICD-10-CM | POA: Insufficient documentation

## 2023-11-21 DIAGNOSIS — E162 Hypoglycemia, unspecified: Secondary | ICD-10-CM | POA: Diagnosis not present

## 2023-11-21 DIAGNOSIS — Z8673 Personal history of transient ischemic attack (TIA), and cerebral infarction without residual deficits: Secondary | ICD-10-CM

## 2023-11-21 DIAGNOSIS — Z8616 Personal history of COVID-19: Secondary | ICD-10-CM | POA: Diagnosis not present

## 2023-11-21 DIAGNOSIS — J449 Chronic obstructive pulmonary disease, unspecified: Secondary | ICD-10-CM | POA: Diagnosis not present

## 2023-11-21 DIAGNOSIS — D61818 Other pancytopenia: Secondary | ICD-10-CM | POA: Diagnosis present

## 2023-11-21 DIAGNOSIS — R4182 Altered mental status, unspecified: Secondary | ICD-10-CM | POA: Diagnosis present

## 2023-11-21 DIAGNOSIS — K7682 Hepatic encephalopathy: Principal | ICD-10-CM

## 2023-11-21 LAB — CBC
HCT: 28.1 % — ABNORMAL LOW (ref 39.0–52.0)
Hemoglobin: 9.8 g/dL — ABNORMAL LOW (ref 13.0–17.0)
MCH: 32 pg (ref 26.0–34.0)
MCHC: 34.9 g/dL (ref 30.0–36.0)
MCV: 91.8 fL (ref 80.0–100.0)
Platelets: 67 10*3/uL — ABNORMAL LOW (ref 150–400)
RBC: 3.06 MIL/uL — ABNORMAL LOW (ref 4.22–5.81)
RDW: 15.8 % — ABNORMAL HIGH (ref 11.5–15.5)
WBC: 2.3 10*3/uL — ABNORMAL LOW (ref 4.0–10.5)
nRBC: 0 % (ref 0.0–0.2)

## 2023-11-21 LAB — BASIC METABOLIC PANEL WITH GFR
Anion gap: 4 — ABNORMAL LOW (ref 5–15)
BUN: 21 mg/dL (ref 8–23)
CO2: 23 mmol/L (ref 22–32)
Calcium: 9.4 mg/dL (ref 8.9–10.3)
Chloride: 111 mmol/L (ref 98–111)
Creatinine, Ser: 1.21 mg/dL (ref 0.61–1.24)
GFR, Estimated: >60 mL/min (ref >60)
Glucose, Bld: 84 mg/dL (ref 70–99)
Potassium: 3.7 mmol/L (ref 3.5–5.1)
Sodium: 138 mmol/L (ref 135–145)

## 2023-11-21 LAB — HEPATIC FUNCTION PANEL
ALT: 31 U/L (ref 0–44)
AST: 40 U/L (ref 15–41)
Albumin: 2.4 g/dL — ABNORMAL LOW (ref 3.5–5.0)
Alkaline Phosphatase: 58 U/L (ref 38–126)
Bilirubin, Direct: 0.4 mg/dL — ABNORMAL HIGH (ref 0.0–0.2)
Indirect Bilirubin: 1.1 mg/dL — ABNORMAL HIGH (ref 0.3–0.9)
Total Bilirubin: 1.5 mg/dL — ABNORMAL HIGH (ref 0.0–1.2)
Total Protein: 5.7 g/dL — ABNORMAL LOW (ref 6.5–8.1)

## 2023-11-21 LAB — AMMONIA: Ammonia: 133 umol/L — ABNORMAL HIGH (ref 9–35)

## 2023-11-21 LAB — TROPONIN I (HIGH SENSITIVITY): Troponin I (High Sensitivity): 15 ng/L (ref <18)

## 2023-11-21 MED ORDER — LACTULOSE ENEMA
300.0000 mL | Freq: Once | ORAL | Status: AC
  Administered 2023-11-21: 300 mL via RECTAL
  Filled 2023-11-21: qty 300

## 2023-11-21 MED ORDER — ONDANSETRON HCL 4 MG PO TABS: 4.0000 mg | ORAL_TABLET | Freq: Four times a day (QID) | ORAL | Status: DC | PRN

## 2023-11-21 MED ORDER — ACETAMINOPHEN 325 MG PO TABS: 650.0000 mg | ORAL_TABLET | Freq: Three times a day (TID) | ORAL | Status: DC | PRN

## 2023-11-21 MED ORDER — SENNOSIDES-DOCUSATE SODIUM 8.6-50 MG PO TABS: 1.0000 | ORAL_TABLET | Freq: Every evening | ORAL | Status: DC | PRN

## 2023-11-21 MED ORDER — RIFAXIMIN 550 MG PO TABS
550.0000 mg | ORAL_TABLET | Freq: Two times a day (BID) | ORAL | Status: DC
  Administered 2023-11-22 – 2023-11-23 (×3): 550 mg via ORAL
  Filled 2023-11-21 (×3): qty 1

## 2023-11-21 MED ORDER — LACTATED RINGERS IV SOLN: INTRAVENOUS | Status: DC

## 2023-11-21 MED ORDER — ENOXAPARIN SODIUM 40 MG/0.4ML IJ SOSY
40.0000 mg | PREFILLED_SYRINGE | INTRAMUSCULAR | Status: DC
Start: 2023-11-22 — End: 2023-11-23
  Administered 2023-11-22 – 2023-11-23 (×2): 40 mg via SUBCUTANEOUS
  Filled 2023-11-21 (×2): qty 0.4

## 2023-11-21 MED ORDER — ONDANSETRON HCL 4 MG/2ML IJ SOLN
4.0000 mg | Freq: Four times a day (QID) | INTRAMUSCULAR | Status: DC | PRN
Start: 2023-11-21 — End: 2023-11-23

## 2023-11-21 NOTE — ED Notes (Signed)
 Lactulose enema given via FMS and allowed to retain for 30 minutes. Rectal tube did fall out with patient bowel movement. Pt cleaned up with RN and EDT prior to transport.

## 2023-11-21 NOTE — ED Triage Notes (Signed)
 First Nurse Note:  Pt via [facility] from [facility] ([address], [place], [place]). EMS reports possible seizure. EMS reports were poor historian. Pt states he just feels "tired". Pt is alert and able to answer some questions. Unknown pt's baseline.  225 CBG  20 RR 97.1 oral.  43 HR, pt has a hx of bradycardia  140/61 BP

## 2023-11-21 NOTE — H&P (Signed)
 History and Physical:    [name]   WGN:[id number] DOB: 11/23/1950 DOA: 11/21/2023  Referring MD/provider: Artis Delay, MD PCP: [facility]   Patient coming from: Group home  Chief Complaint: Altered mental status  History of Present Illness:   [name] is a 73 y.o. male with medical history significant for homelessness, depression, anxiety, hypertension, hyperlipidemia, diabetes mellitus, COPD, chronic diastolic CHF, TIA, migraine headache, hepatitis C, liver cirrhosis, liver lesion (suspected cancer), chronic thrombocytopenia, who presented from the group home to the ED because of altered mental status.  He is lethargic and confused and unable to provide any history at this time.  History was obtained from Dr. Fuller Plan, and ED physician.  ED Course:  The patient was noted to be bradycardic in the ED.  Otherwise vital signs are okay.  Labs significant for ammonia of 133.  ROS:   ROS unable to obtain because of altered mental status  Past Medical History:   Past Medical History:  Diagnosis Date   (HFpEF) heart failure with preserved ejection fraction (HCC)    a.) TTE 03/20/2018: EF 60-65%, no RWMAs, mild LAE, mild-mod MR, PASP 42; b.) TTE 03/31/2021: EF 55-60%, no RWMAs, mild LVH, mild LAE, AoV sclerosis without stenosis; c.) TTE 03/25/2022: EF 60-65%, no RWMAs, AoV sclerosis without stenosis; d.) TTE 12/24/2022: EF 60-65%, no RWMAs, mild LVH, mild LAE.   Anasarca    Anemia    Anxiety    Aortic atherosclerosis (HCC)    Arthritis    Asthma    Bacteremia due to Escherichia coli 05/2023   Bacteremia due to Klebsiella pneumoniae 2023   Bilateral carotid artery disease (HCC)    Bradycardia    CAD (coronary artery disease) 10/21/2006   a.) MV 10/21/2006: small reversible defect involving the apical  lateral wall c/w possible ischemia; b.) MV: 03/24/2018: no ischemia; c.) MV 10/04/2021: LAD calcifications   CAD (coronary artery disease)    Cervical  radiculopathy    Chest pain    a.) MV 03/21/2018: no ischemia; b.) MV 10/04/2021: no ischemia   Chronic back pain    Chronic common bile duct dilatation    Cirrhosis (HCC)    a.) (+) hepatic lesion on CT; likely hepatocellular carcinoma; b.) CT evidence of associated portal hypertension; c.) (+) abdominopelvic anasarca   COPD (chronic obstructive pulmonary disease) (HCC)    DDD (degenerative disc disease), lumbar    Depression    Frequent falls    GERD (gastroesophageal reflux disease)    HBV (hepatitis B virus) infection    HCV (hepatitis C virus)    Hernia of anterior abdominal wall    History of 2019 novel coronavirus disease (COVID-19) 04/16/2020   Homelessness    a.) limited on placement as patient is listed on [place] sex offender listing for exploitation of a minor (possession of child pornography); incarcerated 12 years (released 11/2017)   Hyperlipidemia    Hypertension    Infestation by bed bug 02/2022   Malingering    Migraine    Nephrolithiasis    Obesity    Portal hypertension (HCC)    PTSD (post-traumatic stress disorder)    a.) brief training in the [employer] at age 62; someone was killed in front of him   Pulmonary HTN (HCC)    a.) multiple CT scans desmontrating dilated PA c/w pHTN; b.) TTE 03/20/2018: PASP 42 mmHg   Sleep apnea    a.) no nocturnal PAP therapy   Sleep apnea  Stenosis of cervical spine with myelopathy (HCC)    Suicidal ideations    T2DM (type 2 diabetes mellitus) (HCC)    Thrombocytopenia (HCC)    a.) related to hepatic cirrhosis; spleen normal in size   TIA (transient ischemic attack)    TIA (transient ischemic attack) 09/30/2023    Past Surgical History:   Past Surgical History:  Procedure Laterality Date   ANTERIOR CERVICAL DECOMP/DISCECTOMY FUSION N/A 07/01/2023   Procedure: ANTERIOR CERVICAL DISCECTOMY AND FUSION (FORGE);  Surgeon: Venetia Night, MD;  Location: [facility] ORS;  Service: Neurosurgery;  Laterality: N/A;  Keep as last case of  the day per Dr Myer Haff   ANTERIOR CERVICAL DISCECTOMY     APPENDECTOMY     CARDIAC CATHETERIZATION     CHOLECYSTECTOMY     EYE SURGERY     INNER EAR SURGERY     NOSE SURGERY     XI ROBOTIC LAPAROSCOPIC ASSISTED APPENDECTOMY N/A 11/05/2022   Procedure: XI ROBOTIC LAPAROSCOPIC ASSISTED APPENDECTOMY;  Surgeon: Carolan Shiver, MD;  Location: [facility] ORS;  Service: General;  Laterality: N/A;    Social History:   Social History   Socioeconomic History   Marital status: Single    Spouse name: Not on file   Number of children: 2   Years of education: 12   Highest education level: 12th grade  Occupational History   Occupation: retired  Tobacco Use   Smoking status: Every Day    Types: Cigarettes   Smokeless tobacco: Never  Vaping Use   Vaping status: Not on file  Substance and Sexual Activity   Alcohol use: Not Currently   Drug use: Not Currently   Sexual activity: Not Currently    Birth control/protection: None  Other Topics Concern   Not on file  Social History Narrative   ** Merged History Encounter **       Lives in Between care home   Social Drivers of Health   Financial Resource Strain: High Risk (04/27/2022)   Overall Financial Resource Strain (CARDIA)    Difficulty of Paying Living Expenses: Very hard  Food Insecurity: No Food Insecurity (11/14/2023)   Hunger Vital Sign    Worried About Running Out of Food in the Last Year: Never true    Ran Out of Food in the Last Year: Never true  Transportation Needs: No Transportation Needs (11/14/2023)   PRAPARE - Administrator, Civil Service (Medical): No    Lack of Transportation (Non-Medical): No  Physical Activity: Sufficiently Active (04/16/2018)   Exercise Vital Sign    Days of Exercise per Week: 7 days    Minutes of Exercise per Session: 30 min  Stress: Stress Concern Present (04/16/2018)   [employer] of Occupational Health - Occupational Stress Questionnaire    Feeling of Stress : Very  much  Social Connections: Unknown (11/14/2023)   Social Connection and Isolation Panel [NHANES]    Frequency of Communication with Friends and Family: Never    Frequency of Social Gatherings with Friends and Family: More than three times a week    Attends Religious Services: Never    Database administrator or Organizations: No    Attends Banker Meetings: Never    Marital Status: Patient unable to answer  Intimate Partner Violence: Not At Risk (11/14/2023)   [name], Afraid, [name], and Kick questionnaire    Fear of Current or Ex-Partner: No    Emotionally Abused: No    Physically Abused: No    Sexually  Abused: No    Allergies   Bee venom, Codeine, Rsv mrna pre-f virus vaccine, Meloxicam, Codeine, Novocain [procaine], Sulfa antibiotics, and Sulfa antibiotics  Family history:   Family History  Problem Relation Age of Onset   Hypertension Mother    Heart disease Mother    Heart failure Maternal Grandmother     Current Medications:   Prior to Admission medications   Medication Sig Start Date End Date Taking? Authorizing Provider  acetaminophen (TYLENOL) 650 MG CR tablet Take 650 mg by mouth every 8 (eight) hours as needed for pain.    [provider]  albuterol (PROVENTIL) (2.5 MG/3ML) 0.083% nebulizer solution Take 2.5 mg by nebulization every 4 (four) hours as needed for wheezing or shortness of breath. 07/16/23   [provider]  aspirin 81 MG chewable tablet Chew 81 mg by mouth daily.    [provider]  atorvastatin (LIPITOR) 40 MG tablet Take 40 mg by mouth daily.    [provider]  citalopram (CELEXA) 20 MG tablet Take 20 mg by mouth daily.    [provider]  feeding supplement (ENSURE ENLIVE / ENSURE PLUS) LIQD Take 237 mLs by mouth 3 (three) times daily between meals. 10/25/23   Jonah Blue, MD  furosemide (LASIX) 20 MG tablet Take 1 tablet (20 mg total) by mouth daily. 11/07/23   Agbata, Elwyn Lade, MD   gabapentin (NEURONTIN) 300 MG capsule Take 300 mg by mouth 2 (two) times daily. 06/07/23   [provider]  lactulose (CHRONULAC) 10 GM/15ML solution Take 45 mLs (30 g total) by mouth 3 (three) times daily. 08/05/23   Lanae Boast, MD  Multiple Vitamin (MULTIVITAMIN WITH MINERALS) TABS tablet Take 1 tablet by mouth daily.    [provider]  nitroGLYCERIN (NITROLINGUAL) 0.4 MG/SPRAY spray Place 1 spray under the tongue every 5 (five) minutes x 3 doses as needed for chest pain. 10/30/23   Delma Freeze, FNP  ondansetron (ZOFRAN) 4 MG tablet Take 1 tablet (4 mg total) by mouth every 6 (six) hours as needed for nausea. 06/21/23   Alford Highland, MD  pantoprazole (PROTONIX) 40 MG tablet Take 40 mg by mouth daily. 09/24/23   [provider]  phenol (CHLORASEPTIC) 1.4 % LIQD Use as directed 1 spray in the mouth or throat as needed for throat irritation / pain.    [provider]  QUEtiapine (SEROQUEL) 25 MG tablet Take 25 mg by mouth at bedtime as needed (Agitation/sleep).    [provider]  senna (SENOKOT) 8.6 MG TABS tablet Take 1 tablet (8.6 mg total) by mouth 2 (two) times daily as needed for mild constipation. 07/02/23   Susanne Borders, PA  spironolactone (ALDACTONE) 25 MG tablet Take 25 mg by mouth daily. 09/24/23   [provider]  VENTOLIN HFA 108 (90 Base) MCG/ACT inhaler Inhale 2 puffs into the lungs every 4 (four) hours as needed for wheezing. 07/16/23   [provider]  XIFAXAN 550 MG TABS tablet Take 550 mg by mouth 2 (two) times daily. 09/24/23   [provider]    Physical Exam:   Vitals:   11/21/23 0854 11/21/23 1210  BP: 129/65 127/70  Pulse: (!) 43 (!) 41  Resp: 16 13  Temp: 97.8 F (36.6 C)   TempSrc: Oral   SpO2: 100% 100%     Physical Exam: Blood pressure 127/70, pulse (!) 41, temperature 97.8 F (36.6 C), temperature source Oral, resp. rate 13, SpO2 100%. Gen: No acute distress.  Head:  Normocephalic, atraumatic. Eyes: Pupils equal, round and reactive to light. Extraocular movements intact.  Sclerae nonicteric.  Mouth: Dry mucous membranes Neck: Supple, no thyromegaly, no lymphadenopathy, no jugular venous distention. Chest: Lungs are clear to auscultation with good air movement. No rales, rhonchi or wheezes.  CV: Heart sounds are regular with an S1, S2. No murmurs, rubs or gallops.  Abdomen: Soft, nontender, nondistended with normal active bowel sounds. No palpable masses. Extremities: Extremities are without clubbing, or cyanosis. No edema.  Skin: Warm and dry.  Neuro: Lethargic and confused.  Speech is soft, slurred and incomprehensible Psych: Confused.  Poor insight and judgment   Data Review:    Labs: Basic Metabolic Panel: Recent Labs  Lab 11/21/23 0910  NA 138  K 3.7  CL 111  CO2 23  GLUCOSE 84  BUN 21  CREATININE 1.21  CALCIUM 9.4   Liver Function Tests: Recent Labs  Lab 11/21/23 0910  AST 40  ALT 31  ALKPHOS 58  BILITOT 1.5*  PROT 5.7*  ALBUMIN 2.4*   No results for input(s): "LIPASE", "AMYLASE" in the last 168 hours. Recent Labs  Lab 11/21/23 1020  AMMONIA 133*   CBC: Recent Labs  Lab 11/21/23 0910  WBC 2.3*  HGB 9.8*  HCT 28.1*  MCV 91.8  PLT 67*   Cardiac Enzymes: No results for input(s): "CKTOTAL", "CKMB", "CKMBINDEX", "TROPONINI" in the last 168 hours.  BNP (last 3 results) No results for input(s): "PROBNP" in the last 8760 hours. CBG: No results for input(s): "GLUCAP" in the last 168 hours.  Urinalysis    Component Value Date/Time   COLORURINE YELLOW (A) 11/12/2023 0230   APPEARANCEUR HAZY (A) 11/12/2023 0230   LABSPEC 1.016 11/12/2023 0230   PHURINE 6.0 11/12/2023 0230   GLUCOSEU 150 (A) 11/12/2023 0230   HGBUR NEGATIVE 11/12/2023 0230   BILIRUBINUR NEGATIVE 11/12/2023 0230   KETONESUR NEGATIVE 11/12/2023 0230   PROTEINUR NEGATIVE 11/12/2023 0230   NITRITE NEGATIVE 11/12/2023 0230   LEUKOCYTESUR NEGATIVE  11/12/2023 0230      Radiographic Studies: CT HEAD WO CONTRAST ( ) Result Date: 11/21/2023 CLINICAL DATA:  Possible seizure.  Neuro deficit. EXAM: CT HEAD WITHOUT CONTRAST TECHNIQUE: Contiguous axial images were obtained from the base of the skull through the vertex without intravenous contrast. RADIATION DOSE REDUCTION: This exam was performed according to the departmental dose-optimization program which includes automated exposure control, adjustment of the mA and/or kV according to patient size and/or use of iterative reconstruction technique. COMPARISON:  Head CT from 10 days ago FINDINGS: Brain: No evidence of acute infarction, hemorrhage, hydrocephalus, extra-axial collection or mass lesion/mass effect. Vascular: No hyperdense vessel or unexpected calcification. Skull: Normal. Negative for fracture or focal lesion. Sinuses/Orbits: No acute finding. IMPRESSION: Stable and negative head CT. Electronically Signed   By: Tiburcio Pea M.D.   On: 11/21/2023 11:20    EKG: No EKG on chart   Assessment/Plan:   Principal Problem:   Acute hepatic encephalopathy (HCC)   There is no height or weight on file to calculate BMI.   Recurrent acute metabolic encephalopathy: Admit to medical telemetry.  Treat with lactulose per rectum.  Continue Xifaxan when patient is able to safely take oral medications.   Decompensated liver cirrhosis: Acute hepatic encephalopathy as above.  Resume Lasix and Aldactone when he is able to safely take oral medications.   Sinus bradycardia: Heart rate mainly in the 40s.  Monitor on telemetry   Chronic pancytopenia: This is likely from hepatic encephalopathy.  Comorbidities include CAD, bilateral carotid artery disease, COPD, chronic diastolic CHF, depression, hypertension, hyperlipidemia, history of TIA  Other information:   DVT prophylaxis: Lovenox  Code Status: DNR.  Chart review shows he has a DNR form that was signed on 07/2023. Family  Communication: None Disposition Plan: Plan to discharge back to group home Consults called: None Admission status: Observation   Lucylle Foulkes [facility] Pager: Please check [email or web address]   How to contact the [facility] Attending or Consulting provider 7A - 7P or covering provider during after hours 7P -7A, for this patient?   Check the care team in [facility] and look for a) attending/consulting [facility] provider listed and b) the [facility] team listed Log into [email or web address] and use [place]'s universal password to access. If you do not have the password, please contact the hospital operator. Locate the [facility] provider you are looking for under [facility] and page to a number that you can be directly reached. If you still have difficulty reaching the provider, please page the [facility] (Director on Call) for the Hospitalists listed on amion for assistance.  11/21/2023, 12:18 PM

## 2023-11-21 NOTE — ED Provider Notes (Addendum)
 [facility] Provider Note    Event Date/Time   First MD Initiated Contact with Patient 11/21/23 [phone]     (approximate)   History   No chief complaint on file.   HPI  [name] is a 73 y.o. male who comes in from family care home.  EMS concerned about possible seizure.  Patient is a very poor historian and states that he just feels tired.  I reviewed patient's hospital discharge summary from 11/15/2023 where patient is known to have hepatic encephalopathy and has had improvement after lactulose therapy.    Discussed with facility and they deny any concerns of seizure-like activity was just more like he was unresponsive but he was staring out and his eyes seem more glossed over.  This seems similar to when he is actually had to be sent here previously they stated.  They deny any known falls.  Unable to obtain HPI from patient due to altered mental status   Physical Exam   Triage Vital Signs: ED Triage Vitals  Encounter Vitals Group     BP 11/21/23 0854 129/65     Systolic BP Percentile --      Diastolic BP Percentile --      Pulse Rate 11/21/23 0854 (!) 43     Resp 11/21/23 0854 16     Temp 11/21/23 0854 97.8 F (36.6 C)     Temp Source 11/21/23 0854 Oral     SpO2 11/21/23 0854 100 %     Weight --      Height --      Head Circumference --      Peak Flow --      Pain Score 11/21/23 0902 10     Pain Loc --      Pain Education --      Exclude from Growth Chart --     Most recent vital signs: Vitals:   11/21/23 0854  BP: 129/65  Pulse: (!) 43  Resp: 16  Temp: 97.8 F (36.6 C)  SpO2: 100%     General: Awake, patient is able to state his name however unable to follow commands.  Appears altered CV:  Good peripheral perfusion.  Resp:  Normal effort.  Soft and nontender Abd:  No distention.  Other:     ED Results / Procedures / Treatments   Labs (all labs ordered are listed, but only abnormal results are displayed) Labs  Reviewed  BASIC METABOLIC PANEL WITH GFR - Abnormal; Notable for the following components:      Result Value   Anion gap 4 (*)    All other components within normal limits  CBC - Abnormal; Notable for the following components:   WBC 2.3 (*)    RBC 3.06 (*)    Hemoglobin 9.8 (*)    HCT 28.1 (*)    RDW 15.8 (*)    Platelets 67 (*)    All other components within normal limits  URINALYSIS, ROUTINE W REFLEX MICROSCOPIC  CBG MONITORING, ED     EKG  My interpretation of EKG:  Sinus rate of 42 without any ST elevation or T wave inversions, normal intervals.  EKG has been read as junctional but upon my review patient does have P waves he does look more consistent with sinus bradycardia   reviewed prior EKG where he was sinus bradycardia  RADIOLOGY I have reviewed the ct  personally and interpreted no evidence of intracranial hemorrhage   PROCEDURES:  Critical Care  performed: Yes, see critical care procedure note(s)  .Critical Care  Performed by: Concha Se, MD Authorized by: Concha Se, MD   Critical care provider statement:    Critical care time (minutes):  30   Critical care was necessary to treat or prevent imminent or life-threatening deterioration of the following conditions:  Hepatic failure   Critical care was time spent personally by me on the following activities:  Development of treatment plan with patient or surrogate, discussions with consultants, evaluation of patient's response to treatment, examination of patient, ordering and review of laboratory studies, ordering and review of radiographic studies, ordering and performing treatments and interventions, pulse oximetry, re-evaluation of patient's condition and review of old charts .1-3 Lead EKG Interpretation  Performed by: Concha Se, MD Authorized by: Concha Se, MD     Interpretation: abnormal     ECG rate:  40   ECG rate assessment: bradycardic     Rhythm: sinus bradycardia     Ectopy: none      Conduction: normal      MEDICATIONS ORDERED IN ED: Medications  lactulose (CHRONULAC) enema 200 gm (has no administration in time range)     IMPRESSION / MDM / ASSESSMENT AND PLAN / ED COURSE  I reviewed the triage vital signs and the nursing notes.   Patient's presentation is most consistent with exacerbation of chronic illness.   I suspect this is most likely from patient's elevated ammonia and hepatic encephalopathy given his prior history of this and with discussion with the facility this seems most consistent however given him unable to get him to follow any commands I will get CT head to ensure no evidence of intracranial hemorrhage.  Patient is bradycardic but he typically runs a slow.  Labs to be ordered evaluate for any evidence of ACS, electrolyte abnormalities, AKI, UTI  Ammonia level is elevated consistent with hepatic cephalopathy.  BMP reassuring CBC shows stably low white count no evidence of sepsis holding on antibiotics.  Hemoglobin is slightly lower than previous to continue the monitor that.  Platelets are similarly low.  Troponin is negative.  I will discuss with hospital team for admission and order lactulose rectally  The patient is on the cardiac monitor to evaluate for evidence of arrhythmia and/or significant heart rate changes.      FINAL CLINICAL IMPRESSION(S) / ED DIAGNOSES   Final diagnoses:  Hepatic encephalopathy (HCC)     Rx / DC Orders   ED Discharge Orders     None        Note:  This document was prepared using Dragon voice recognition software and may include unintentional dictation errors.   Concha Se, MD 11/21/23 1033    Concha Se, MD 11/21/23 [phone]

## 2023-11-22 DIAGNOSIS — Z515 Encounter for palliative care: Secondary | ICD-10-CM | POA: Diagnosis not present

## 2023-11-22 DIAGNOSIS — K7682 Hepatic encephalopathy: Secondary | ICD-10-CM | POA: Diagnosis not present

## 2023-11-22 DIAGNOSIS — D61818 Other pancytopenia: Secondary | ICD-10-CM

## 2023-11-22 DIAGNOSIS — K7460 Unspecified cirrhosis of liver: Secondary | ICD-10-CM

## 2023-11-22 LAB — BASIC METABOLIC PANEL WITH GFR
Anion gap: 6 (ref 5–15)
BUN: 21 mg/dL (ref 8–23)
CO2: 20 mmol/L — ABNORMAL LOW (ref 22–32)
Calcium: 9.8 mg/dL (ref 8.9–10.3)
Chloride: 114 mmol/L — ABNORMAL HIGH (ref 98–111)
Creatinine, Ser: 1.08 mg/dL (ref 0.61–1.24)
GFR, Estimated: >60 mL/min (ref >60)
Glucose, Bld: 65 mg/dL — ABNORMAL LOW (ref 70–99)
Potassium: 3.6 mmol/L (ref 3.5–5.1)
Sodium: 140 mmol/L (ref 135–145)

## 2023-11-22 LAB — CBC
HCT: 29.6 % — ABNORMAL LOW (ref 39.0–52.0)
Hemoglobin: 10.5 g/dL — ABNORMAL LOW (ref 13.0–17.0)
MCH: 32.5 pg (ref 26.0–34.0)
MCHC: 35.5 g/dL (ref 30.0–36.0)
MCV: 91.6 fL (ref 80.0–100.0)
Platelets: 68 10*3/uL — ABNORMAL LOW (ref 150–400)
RBC: 3.23 MIL/uL — ABNORMAL LOW (ref 4.22–5.81)
RDW: 15.3 % (ref 11.5–15.5)
WBC: 2.7 10*3/uL — ABNORMAL LOW (ref 4.0–10.5)
nRBC: 0 % (ref 0.0–0.2)

## 2023-11-22 LAB — GLUCOSE, CAPILLARY
Glucose-Capillary: 137 mg/dL — ABNORMAL HIGH (ref 70–99)
Glucose-Capillary: 121 mg/dL — ABNORMAL HIGH (ref 70–99)

## 2023-11-22 LAB — AMMONIA: Ammonia: 75 umol/L — ABNORMAL HIGH (ref 9–35)

## 2023-11-22 MED ORDER — SPIRONOLACTONE 25 MG PO TABS
25.0000 mg | ORAL_TABLET | Freq: Every day | ORAL | Status: DC
  Administered 2023-11-22 – 2023-11-23 (×2): 25 mg via ORAL
  Filled 2023-11-22 (×2): qty 1

## 2023-11-22 MED ORDER — ATORVASTATIN CALCIUM 20 MG PO TABS
40.0000 mg | ORAL_TABLET | Freq: Every day | ORAL | Status: DC
  Administered 2023-11-22 – 2023-11-23 (×2): 40 mg via ORAL
  Filled 2023-11-22 (×2): qty 2

## 2023-11-22 MED ORDER — CHLORHEXIDINE GLUCONATE CLOTH 2 % EX PADS
6.0000 | MEDICATED_PAD | Freq: Every day | CUTANEOUS | Status: DC
Start: 2023-11-22 — End: 2023-11-23
  Administered 2023-11-22: 6 via TOPICAL

## 2023-11-22 MED ORDER — FUROSEMIDE 20 MG PO TABS
20.0000 mg | ORAL_TABLET | Freq: Every day | ORAL | Status: DC
  Administered 2023-11-22 – 2023-11-23 (×2): 20 mg via ORAL
  Filled 2023-11-22 (×2): qty 1

## 2023-11-22 MED ORDER — QUETIAPINE FUMARATE 25 MG PO TABS
50.0000 mg | ORAL_TABLET | Freq: Every day | ORAL | Status: DC
  Administered 2023-11-22: 50 mg via ORAL
  Filled 2023-11-22: qty 2

## 2023-11-22 MED ORDER — ASPIRIN 81 MG PO CHEW
81.0000 mg | CHEWABLE_TABLET | Freq: Every day | ORAL | Status: DC
  Administered 2023-11-22 – 2023-11-23 (×2): 81 mg via ORAL
  Filled 2023-11-22 (×2): qty 1

## 2023-11-22 MED ORDER — GABAPENTIN 300 MG PO CAPS
300.0000 mg | ORAL_CAPSULE | Freq: Two times a day (BID) | ORAL | Status: DC
  Administered 2023-11-22 – 2023-11-23 (×3): 300 mg via ORAL
  Filled 2023-11-22 (×3): qty 1

## 2023-11-22 MED ORDER — CITALOPRAM HYDROBROMIDE 20 MG PO TABS
20.0000 mg | ORAL_TABLET | Freq: Every day | ORAL | Status: DC
  Administered 2023-11-22 – 2023-11-23 (×2): 20 mg via ORAL
  Filled 2023-11-22 (×2): qty 1

## 2023-11-22 MED ORDER — LACTULOSE 10 GM/15ML PO SOLN
30.0000 g | Freq: Three times a day (TID) | ORAL | Status: DC
  Administered 2023-11-22 – 2023-11-23 (×4): 30 g via ORAL
  Filled 2023-11-22 (×4): qty 60

## 2023-11-22 MED ORDER — PANTOPRAZOLE SODIUM 40 MG PO TBEC
40.0000 mg | DELAYED_RELEASE_TABLET | Freq: Every day | ORAL | Status: DC
  Administered 2023-11-22 – 2023-11-23 (×2): 40 mg via ORAL
  Filled 2023-11-22 (×2): qty 1

## 2023-11-22 NOTE — Evaluation (Signed)
 Clinical/Bedside Swallow Evaluation Patient Details  Name: [name] MRN: [id number] Date of Birth: November 12, 1950  Today's Date: 11/22/2023 Time: SLP Start Time (ACUTE ONLY): 1335 SLP Stop Time (ACUTE ONLY): 1425 SLP Time Calculation (min) (ACUTE ONLY): 50 min  Past Medical History:  Past Medical History:  Diagnosis Date   (HFpEF) heart failure with preserved ejection fraction (HCC)    a.) TTE 03/20/2018: EF 60-65%, no RWMAs, mild LAE, mild-mod MR, PASP 42; b.) TTE 03/31/2021: EF 55-60%, no RWMAs, mild LVH, mild LAE, AoV sclerosis without stenosis; c.) TTE 03/25/2022: EF 60-65%, no RWMAs, AoV sclerosis without stenosis; d.) TTE 12/24/2022: EF 60-65%, no RWMAs, mild LVH, mild LAE.   Anasarca    Anemia    Anxiety    Aortic atherosclerosis (HCC)    Arthritis    Asthma    Bacteremia due to Escherichia coli 05/2023   Bacteremia due to Klebsiella pneumoniae 2023   Bilateral carotid artery disease (HCC)    Bradycardia    CAD (coronary artery disease) 10/21/2006   a.) MV 10/21/2006: small reversible defect involving the apical  lateral wall c/w possible ischemia; b.) MV: 03/24/2018: no ischemia; c.) MV 10/04/2021: LAD calcifications   CAD (coronary artery disease)    Cervical radiculopathy    Chest pain    a.) MV 03/21/2018: no ischemia; b.) MV 10/04/2021: no ischemia   Chronic back pain    Chronic common bile duct dilatation    Cirrhosis (HCC)    a.) (+) hepatic lesion on CT; likely hepatocellular carcinoma; b.) CT evidence of associated portal hypertension; c.) (+) abdominopelvic anasarca   COPD (chronic obstructive pulmonary disease) (HCC)    DDD (degenerative disc disease), lumbar    Depression    Frequent falls    GERD (gastroesophageal reflux disease)    HBV (hepatitis B virus) infection    HCV (hepatitis C virus)    Hernia of anterior abdominal wall    History of 2019 novel coronavirus disease (COVID-19) 04/16/2020   Homelessness    a.) limited on placement as patient is  listed on [place] sex offender listing for exploitation of a minor (possession of child pornography); incarcerated 12 years (released 11/2017)   Hyperlipidemia    Hypertension    Infestation by bed bug 02/2022   Malingering    Migraine    Nephrolithiasis    Obesity    Portal hypertension (HCC)    PTSD (post-traumatic stress disorder)    a.) brief training in the [employer] at age 40; someone was killed in front of him   Pulmonary HTN (HCC)    a.) multiple CT scans desmontrating dilated PA c/w pHTN; b.) TTE 03/20/2018: PASP 42 mmHg   Sleep apnea    a.) no nocturnal PAP therapy   Sleep apnea    Stenosis of cervical spine with myelopathy (HCC)    Suicidal ideations    T2DM (type 2 diabetes mellitus) (HCC)    Thrombocytopenia (HCC)    a.) related to hepatic cirrhosis; spleen normal in size   TIA (transient ischemic attack)    TIA (transient ischemic attack) 09/30/2023   Past Surgical History:  Past Surgical History:  Procedure Laterality Date   ANTERIOR CERVICAL DECOMP/DISCECTOMY FUSION N/A 07/01/2023   Procedure: ANTERIOR CERVICAL DISCECTOMY AND FUSION (FORGE);  Surgeon: Venetia Night, MD;  Location: [facility] ORS;  Service: Neurosurgery;  Laterality: N/A;  Keep as last case of the day per Dr Myer Haff   ANTERIOR CERVICAL DISCECTOMY     APPENDECTOMY     CARDIAC  CATHETERIZATION     CHOLECYSTECTOMY     EYE SURGERY     INNER EAR SURGERY     NOSE SURGERY     XI ROBOTIC LAPAROSCOPIC ASSISTED APPENDECTOMY N/A 11/05/2022   Procedure: XI ROBOTIC LAPAROSCOPIC ASSISTED APPENDECTOMY;  Surgeon: Carolan Shiver, MD;  Location: [facility] ORS;  Service: General;  Laterality: N/A;   HPI:  Pt is a 73 y.o. male with medical history significant of hepatitis C, liver cirrhosis, hepatic encephalopathy recurrent, TIA, chronic HFpEF, CAD, COPD, IIDM, HTN, HLD, MD suspects Baseline Cognitive decline.   He has been residing at a Group Home and was sent to the ED w/ AMS and possible seizure. PMH significant for  hepatitis C, liver cirrhosis, hepatic encephalopathy recurrent.  Recently hospitalized from 3/6 to 3/13 for hepatic encephalopathy with similar presentation and improvement to baseline with lactulose therapy. Multiple prior admissions with most recent discharge 3/21, returned to ED same day after pt cut Foley cath tubing.. Patient has admitted w/ Confusion and noncompliant with his lactulose tx in recent past.  CXR: No active disease.  Head CT: No acute intracranial abnormality.  Atrophy and chronic small vessel ischemia.  Pt has had multiple recent admits to the Hospital per chart/MD -- Palliative Care consult requested.    Assessment / Plan / Recommendation  Clinical Impression   Pt seen for BSE today. t seen for BSE this AM. Pt awake, verbal and able to answer a few direct, basic questions re: self. Orientation poor. Stated he needed to go to the bathroom to "do both". NSG notified. Noted per pt's chart history, he has had MULTIPLE admits in recent months. Pt appears Deconditioned. On RA, afebrile. WBC low.  Pt appears to present w/ grossly functional oropharyngeal phase swallowing; min oral phase dysphagia in setting of declined Cognitive status (suspected and stated per MD chart notes) and Edentulous status (Both Baseline). No overt pharyngeal phase swallowing deficits noted w/ feeding support and Supervision during oral intake. ANY Cognitive decline can impact overall awareness/timing of swallow and safety during po tasks which increases risk for aspiration, choking. Pt appears Deconditioned. Pt's risk for aspiration appears able to be reduced when following general aspiration precautions including feeding support and when using a modified diet consistency of Minced/puree foods w/ thin liquids(d/t Edentulous status and Cognitive decline).      Pt encouraged and assisted in positioning himself more upright in bed for po trials -- he needed MOD support. He and the bed were positioned upright for the po  trials. Pt then fed himself trials of thin liquids via straw(~6+ ozs total) and trials of puree foods then minced solids moistened well w/ No overt clinical s/s of aspiration noted: no decline in vocal quality, no cough, and no decline in respiratory status during/post trials. O2 sats 98% when checked during. Oral phase was grossly adequate for bolus management, mashing/gumming, and oral clearing of the boluses given. Min Time given/needed to fully mash/clear food trials. Educated on full oral clearing b/t bites, need to "SLOW DOWN" and clear mouth b/t each bite. Pt followed w/ fair-good accuracy. Suspect decreased insight into risk for aspiration when eating/drinking lying reclined and when Impulsively drinking.   In setting of Cognitive decline, Edentulous status, and previously recommended oral diet last admit,  recommend continue w/ the dysphagia level 2(Minced w/ added Puree foods) moistened for ease and flavor; thin liquids. General aspiration precautions; reduce Distractions during meals and engage pt during meals for self-feeding. Monitor for Impulsive feeding behaviors and oral clearing  b/t bites. Pills Crushed vs whole in Puree for safer swallowing. Support/Supervision at meals; monitoring for needs. MD/NSG updated.   Recommend follow w/ Palliative Care and Dietician for support. Suspect pt is close to/at his baseline. No further Acute ST needs indicated -- f/u at his next venue of care per MD if indicated. Precautions posted in room, chart. SLP Visit Diagnosis: Dysphagia, oral phase (R13.11) (Baseline Cognitive decline suspected per MD,chart; Edentulous)    Aspiration Risk  Mild aspiration risk;Risk for inadequate nutrition/hydration (d/t Cognitive decline but reduced following general aspiration precautions)    Diet Recommendation   Thin;Dysphagia 2 (chopped) (w/ gravies to moisten well -- baseline rec'd diet) = continue w/ the dysphagia level 2(Minced w/ added Puree foods) moistened for ease  and flavor; thin liquids. General aspiration precautions; reduce Distractions during meals and engage pt during meals for self-feeding. Monitor for Impulsive feeding behaviors and oral clearing b/t bites. Support/Supervision at meals; monitoring for needs.  Medication Administration: Crushed with puree (vs Whole in Puree)    Other  Recommendations Recommended Consults:  (Dietician; Palliative Care) Oral Care Recommendations: Oral care BID;Oral care before and after PO;Staff/trained caregiver to provide oral care    Recommendations for follow up therapy are one component of a multi-disciplinary discharge planning process, led by the attending physician.  Recommendations may be updated based on patient status, additional functional criteria and insurance authorization.  Follow up Recommendations No SLP follow up (expected)      Assistance Recommended at Discharge  FULL d/t Cognitive decline; deconditioning  Functional Status Assessment Patient has had a recent decline in their functional status and/or demonstrates limited ability to make significant improvements in function in a reasonable and predictable amount of time  Frequency and Duration  (n/a)   (n/a)       Prognosis Prognosis for improved oropharyngeal function: Fair Barriers to Reach Goals: Cognitive deficits;Time post onset;Severity of deficits;Behavior Barriers/Prognosis Comment: edentulous; cognitive decline -- needs Supervision      Swallow Study   General Date of Onset: 11/21/23 HPI: Pt is a 73 y.o. male with medical history significant of hepatitis C, liver cirrhosis, hepatic encephalopathy recurrent, TIA, chronic HFpEF, CAD, COPD, IIDM, HTN, HLD, MD suspects Baseline Cognitive decline.   He has been residing at a Group Home and was sent to the ED w/ AMS and possible seizure. PMH significant for hepatitis C, liver cirrhosis, hepatic encephalopathy recurrent.  Recently hospitalized from 3/6 to 3/13 for hepatic encephalopathy  with similar presentation and improvement to baseline with lactulose therapy. Multiple prior admissions with most recent discharge 3/21, returned to ED same day after pt cut Foley cath tubing.. Patient has admitted w/ Confusion and noncompliant with his lactulose tx in recent past.  CXR: No active disease.  Head CT: No acute intracranial abnormality.  Atrophy and chronic small vessel ischemia.  Pt has had multiple recent admits to the Hospital per chart/MD -- Palliative Care consult requested. Type of Study: Bedside Swallow Evaluation Previous Swallow Assessment: 09/2023 and 10/2023- dys 2 w/ thins; 2024; 2023 Diet Prior to this Study: Regular;Thin liquids (Level 0) (ordered currently) Temperature Spikes Noted: No (wbc 2.7) Respiratory Status: Room air History of Recent Intubation: No Behavior/Cognition: Alert;Cooperative;Pleasant mood;Confused;Distractible;Requires cueing Oral Cavity Assessment: Within Functional Limits Oral Care Completed by SLP: Yes Oral Cavity - Dentition: Edentulous Vision: Functional for self-feeding Self-Feeding Abilities: Able to feed self;Needs assist;Needs set up Patient Positioning: Upright in bed (MOD++ assist) Baseline Vocal Quality: Normal;Low vocal intensity Volitional Cough: Strong Volitional Swallow: Able to elicit  Oral/Motor/Sensory Function Overall Oral Motor/Sensory Function: Within functional limits (no overt unilateral weakness noted)   Ice Chips Ice chips: Within functional limits Presentation:  (fed; 2 trials)   Thin Liquid Thin Liquid: Within functional limits Presentation: Cup;Self Fed;Straw (~6 ozs total) Other Comments: pinched straw to impulsivity    Nectar Thick Nectar Thick Liquid: Not tested   Honey Thick Honey Thick Liquid: Not tested   Puree Puree: Within functional limits Presentation: Spoon (fed; 10+ trials)   Solid     Solid: Impaired (min+) Presentation: Spoon (fed; 4 trials) Oral Phase Impairments: Impaired mastication;Poor  awareness of bolus (min) Oral Phase Functional Implications:  (Edentulous) Pharyngeal Phase Impairments:  (none)        [name], MS, CCC-SLP Speech Language Pathologist Rehab Services; [facility] [phone] (ascom) Furious Chiarelli 11/22/2023,4:23 PM

## 2023-11-22 NOTE — Consult Note (Signed)
 Consultation Note Date: 11/22/2023   Patient Name: [name]  DOB: 1950/09/15  MRN: [id number]  Age / Sex: 73 y.o., male  PCP: [facility] Referring Physician: Lurene Shadow, MD  Reason for Consultation: Establishing goals of care   HPI/Brief Hospital Course: 73 y.o. male  with past medical history of depression, anxiety, hypertension, hyperlipidemia, diabetes, COPD, CHF, TIA, hepatitis C, liver cirrhosis, liver lesion with suspected cancer and chronic thrombocytopenia admitted from group home on 11/21/2023 with altered mental status.  Noted 7 inpatient admissions within the last 6 months  Ammonia level found to be 133  Palliative medicine was consulted for assisting with goals of care conversations.  Subjective:  Extensive chart review has been completed prior to meeting patient including labs, vital signs, imaging, progress notes, orders, and available advanced directive documents from current and previous encounters.  Visited with Mr. Clenney at his bedside. He is awake, alert, engaging with SLP at bedside.  Introduced myself as a Publishing rights manager as a member of the palliative care team. Explained palliative medicine is specialized medical care for people living with serious illness. It focuses on providing relief from the symptoms and stress of a serious illness. The goal is to improve quality of life for both the patient and the family.   He is able to correctly state his name, DOB and location. Unclear on time and situation.  He references being at "home" and a "lady/nurse" making him come to the hospital.  Attempted to discuss underlying comorbid conditions such as cirrhosis, Mr. Bail shares at his "home" they assist him with his medications. He shares he has only been diagnosed with liver disease for about a month and only prescribed medication for that same amount of time. Mr. Lopp does share that he is tired of  being made to come back and forth to the hospital and wishes to stay home.  Mr. Baldus shares he does not have any living relatives, he has never been married, mentions a "step daughter" but again confirms he has never been married and cannot recall his step daughters name. His parents and siblings are deceased. Reviewed contact list with him, Louanne as his describes is a friend but they do not communicate much and he would not want her involved in his medical care. Others listed are transporters and workers from his facility.  Assessed symptoms, Mr. Martine denies acute pain or discomfort, reports hearty appetite and has been sleeping well. At his facility he shares he is able to ambulate but spends the majority of the time in his bed sleeping.  Mr. Melendrez does not capacity this time to make medical decisions.  Secure messages sent to [facility] and primary team with recommendations to attempt to establish NOK. Guardianship may need to be pursued if NOK cannot be established. Per primary team, plan to likely discharge tomorrow and can be pursued as an outpatient.  PMT will continue to follow and support patient as needed.  Objective: Primary Diagnoses: Present on Admission:  Acute hepatic encephalopathy (HCC)  CAD (coronary artery disease)  Chronic diastolic CHF (congestive heart failure) (HCC)  COPD (chronic obstructive pulmonary disease) (HCC)  Hypokalemia  Liver cirrhosis (HCC)  Pancytopenia (HCC)   Physical Exam Constitutional:      General: He is not in acute distress.    Appearance: He is ill-appearing.  Pulmonary:     Effort: Pulmonary effort is normal. No respiratory distress.  Skin:    General: Skin is warm and dry.  Neurological:  Mental Status: He is alert and oriented to person, place, and time.     Motor: Weakness present.     Vital Signs: BP (!) 117/90 (BP Location: Left Arm)   Pulse (!) 46   Temp 98.4 F (36.9 C)   Resp 17   Ht 5\' 6"  (1.676 m)   Wt 82.1 kg    SpO2 100%   BMI 29.21 kg/m  Pain Scale: 0-10   Pain Score: 0-No pain  IO: Intake/output summary:  Intake/Output Summary (Last 24 hours) at 11/22/2023 1415 Last data filed at 11/22/2023 0900 Gross per 24 hour  Intake --  Output 1300 ml  Net -1300 ml    LBM: Last BM Date :  (UTA) Baseline Weight: Weight: 82.1 kg Most recent weight: Weight: 82.1 kg       Assessment and Plan  SUMMARY OF RECOMMENDATIONS   Establishment of NOK needed or guardianship establishment   Palliative Prophylaxis:   Bowel Regimen, Delirium Protocol and Frequent Pain Assessment  Discussed With: Primary team and TOC   Thank you for this consult and allowing Palliative Medicine to participate in the care of Sharion Dove. Palliative medicine will continue to follow and assist as needed.   Time Total: 75 minutes  Time spent includes: Detailed review of medical records (labs, imaging, vital signs), medically appropriate exam (mental status, respiratory, cardiac, skin), discussed with treatment team, counseling and educating patient, family and staff, documenting clinical information, medication management and coordination of care.   Signed by: Leeanne Deed, DNP, AGNP-C Palliative Medicine    Please contact Palliative Medicine Team phone at [phone] for questions and concerns.  For individual provider: See Loretha Stapler

## 2023-11-22 NOTE — Progress Notes (Signed)
 Progress Note    [name]  OZH:[id number] DOB: Jan 27, 1951  DOA: 11/21/2023 PCP: [facility]      Brief Narrative:    Medical records reviewed and are as summarized below:  [name] is a 73 y.o. male with medical history significant for homelessness, depression, anxiety, hypertension, hyperlipidemia, diabetes mellitus, COPD, chronic diastolic CHF, TIA, migraine headache, hepatitis C, liver cirrhosis, liver lesion (suspected cancer), chronic thrombocytopenia, who presented from the group home to the ED because of altered mental status.  He is lethargic and confused and unable to provide any history at this time.  History was obtained from Dr. Fuller Plan, and ED physician.   ED Course:  The patient was noted to be bradycardic in the ED.  Otherwise vital signs are okay.  Labs significant for ammonia of 133.     Assessment/Plan:   Principal Problem:   Acute hepatic encephalopathy (HCC) Active Problems:   Hx of transient ischemic attack (TIA)   CAD (coronary artery disease)   Pancytopenia (HCC)   Liver cirrhosis (HCC)   COPD (chronic obstructive pulmonary disease) (HCC)   Chronic diastolic CHF (congestive heart failure) (HCC)   Hypokalemia   Hypoglycemia   Body mass index is 29.21 kg/m.   Recurrent acute metabolic encephalopathy: Ammonia down from 1 33-75.  Mental status is better and is probably at his baseline.  Continue lactulose and rifaximin.     Decompensated liver cirrhosis: Restart Lasix and Aldactone   Hypoglycemia: IV dextrose or sweetened beverages as needed.  Monitor glucose closely     Sinus bradycardia: Asymptomatic.  Heart rate mainly in the 40s.  Monitor on telemetry     Chronic pancytopenia: This is likely from hepatic encephalopathy.   Dysphagia: Speech therapist recommended dysphagia 2 diet. Aspiration precautions.   General Weakness: Consult PT and OT    Comorbidities include CAD, bilateral carotid artery  disease, COPD, chronic diastolic CHF, depression, hypertension, hyperlipidemia, history of TIA   Consulted palliative care team again because of recurrent hospitalizations and chronic medical issues.    Diet Order             DIET DYS 2 Room service appropriate? No; Fluid consistency: Thin  Diet effective now                            Consultants: None  Procedures: None    Medications:    aspirin  81 mg Oral Daily   atorvastatin  40 mg Oral Daily   Chlorhexidine Gluconate Cloth  6 each Topical Q0600   citalopram  20 mg Oral Daily   enoxaparin (LOVENOX) injection  40 mg Subcutaneous Q24H   furosemide  20 mg Oral Daily   gabapentin  300 mg Oral BID   lactulose  30 g Oral TID   pantoprazole  40 mg Oral Daily   QUEtiapine  50 mg Oral QHS   rifaximin  550 mg Oral BID   spironolactone  25 mg Oral Daily   Continuous Infusions:   Anti-infectives (From admission, onward)    Start     Dose/Rate Route Frequency Ordered Stop   11/22/23 1000  rifaximin (XIFAXAN) tablet 550 mg        550 mg Oral 2 times daily 11/21/23 1229                Family Communication/Anticipated D/C date and plan/Code Status   DVT prophylaxis: enoxaparin (LOVENOX) injection 40  mg Start: 11/22/23 1000 SCDs Start: 11/21/23 1227     Code Status: Limited: Do not attempt resuscitation (DNR) -DNR-LIMITED -Do Not Intubate/DNI   Family Communication: None Disposition Plan: Plans discharge to group home   Status is: Observation The patient will require care spanning > 2 midnights and should be moved to inpatient because: Hepatic encephalopathy, general weakness       Subjective:   Interval events noted.  No complaints.  He feels better.  Nursing staff reported trouble swallowing, weakness  Objective:    Vitals:   11/21/23 2015 11/22/23 0451 11/22/23 0750 11/22/23 1149  BP: (!) 154/77 139/64 123/69 (!) 117/90  Pulse: (!) 47  (!) 48 (!) 46  Resp: 15 20 16 17   Temp:  97.9 F (36.6 C) 98.3 F (36.8 C) 98.3 F (36.8 C) 98.4 F (36.9 C)  TempSrc: Oral Oral    SpO2: 100% 100% 100% 100%  Weight: 82.1 kg     Height: 5\' 6"  (1.676 m)      No data found.   Intake/Output Summary (Last 24 hours) at 11/22/2023 1245 Last data filed at 11/22/2023 0900 Gross per 24 hour  Intake --  Output 1300 ml  Net -1300 ml   Filed Weights   11/21/23 2015  Weight: 82.1 kg    Exam:  GEN: NAD SKIN: Warm and dry EYES: No pallor or icterus ENT: MMM CV: RRR PULM: CTA B ABD: soft, ND, NT, +BS CNS: AAO x 3, non focal EXT: No edema or tenderness        Data Reviewed:   I have personally reviewed following labs and imaging studies:  Labs: Labs show the following:   Basic Metabolic Panel: Recent Labs  Lab 11/21/23 0910 11/22/23 0458  NA 138 140  K 3.7 3.6  CL 111 114*  CO2 23 20*  GLUCOSE 84 65*  BUN 21 21  CREATININE 1.21 1.08  CALCIUM 9.4 9.8   GFR Estimated Creatinine Clearance: 60.3 mL/min (by C-G formula based on SCr of 1.08 mg/dL). Liver Function Tests: Recent Labs  Lab 11/21/23 0910  AST 40  ALT 31  ALKPHOS 58  BILITOT 1.5*  PROT 5.7*  ALBUMIN 2.4*   No results for input(s): "LIPASE", "AMYLASE" in the last 168 hours. Recent Labs  Lab 11/21/23 1020 11/22/23 0458  AMMONIA 133* 75*   Coagulation profile No results for input(s): "INR", "PROTIME" in the last 168 hours.  CBC: Recent Labs  Lab 11/21/23 0910 11/22/23 0458  WBC 2.3* 2.7*  HGB 9.8* 10.5*  HCT 28.1* 29.6*  MCV 91.8 91.6  PLT 67* 68*   Cardiac Enzymes: No results for input(s): "CKTOTAL", "CKMB", "CKMBINDEX", "TROPONINI" in the last 168 hours. BNP (last 3 results) No results for input(s): "PROBNP" in the last 8760 hours. CBG: No results for input(s): "GLUCAP" in the last 168 hours. D-Dimer: No results for input(s): "DDIMER" in the last 72 hours. Hgb A1c: No results for input(s): "HGBA1C" in the last 72 hours. Lipid Profile: No results for input(s):  "CHOL", "HDL", "LDLCALC", "TRIG", "CHOLHDL", "LDLDIRECT" in the last 72 hours. Thyroid function studies: No results for input(s): "TSH", "T4TOTAL", "T3FREE", "THYROIDAB" in the last 72 hours.  Invalid input(s): "FREET3" Anemia work up: No results for input(s): "VITAMINB12", "FOLATE", "FERRITIN", "TIBC", "IRON", "RETICCTPCT" in the last 72 hours. Sepsis Labs: Recent Labs  Lab 11/21/23 0910 11/22/23 0458  WBC 2.3* 2.7*    Microbiology No results found for this or any previous visit (from the past 240 hours).  Procedures  and diagnostic studies:  CT HEAD WO CONTRAST ( ) Result Date: 11/21/2023 CLINICAL DATA:  Possible seizure.  Neuro deficit. EXAM: CT HEAD WITHOUT CONTRAST TECHNIQUE: Contiguous axial images were obtained from the base of the skull through the vertex without intravenous contrast. RADIATION DOSE REDUCTION: This exam was performed according to the departmental dose-optimization program which includes automated exposure control, adjustment of the mA and/or kV according to patient size and/or use of iterative reconstruction technique. COMPARISON:  Head CT from 10 days ago FINDINGS: Brain: No evidence of acute infarction, hemorrhage, hydrocephalus, extra-axial collection or mass lesion/mass effect. Vascular: No hyperdense vessel or unexpected calcification. Skull: Normal. Negative for fracture or focal lesion. Sinuses/Orbits: No acute finding. IMPRESSION: Stable and negative head CT. Electronically Signed   By: Tiburcio Pea M.D.   On: 11/21/2023 11:20               LOS: 0 days   Alycen Mack  [facility]   Pager on [email or web address]. If 7PM-7AM, please contact night-coverage at [email or web address]     11/22/2023, 12:45 PM

## 2023-11-22 NOTE — Evaluation (Signed)
 Occupational Therapy Evaluation Patient Details Name: [name] MRN: [id number] DOB: Mar 18, 1951 Today's Date: 11/22/2023   History of Present Illness   73 y.o. male presented to ED from group home with AMS and possible seizure. PMH significant for hepatitis C, liver cirrhosis, hepatic encephalopathy recurrent, TIA, chronic HFpEF, CAD, COPD, IIDM, HTN, HLD, and C3-C6 ACDF on 07/01/23 , homelessness. Recently hospitalized from 3/6 to 3/13 for hepatic encephalopathy with similar presentation and improvement to baseline with lactulose therapy. Multiple prior admissions with most recent discharge 3/21, returned to ED same day after pt cut Foley cath tubing.     Clinical Impressions Pt alert to self and general situation, appears generally confused and poor mentation/cognitive status. Pt states he lives in a group home where staff members assist him with ADLs/IADLs, uses RW for mobility. Pt initially agrees to sit up for seated ADLs, but ultimately self-limits participation by only partially transitioning from supine to dangle feet partially off bed before stating "I'm done" and returning to bed without physical assist. Noted difficulties following simple, 1-step commands as pt is provided wash cloth with vc for face, and begins to wash toes. Anticipate pt requiring up to MAX A for LB ADLs and CGA - MOD A for transfers using RW (based on clinical judgement and performance last hospitalization). Pt would benefit from skilled OT services to address noted impairments and functional limitations (see below for any additional details) in order to maximize safety and independence while minimizing falls risk and caregiver burden. Anticipate the need for follow up OT services upon acute hospital DC.      If plan is discharge home, recommend the following:   Assistance with feeding;Assist for transportation;Direct supervision/assist for medications management;Help with stairs or ramp for entrance;Supervision  due to cognitive status;Direct supervision/assist for financial management;Assistance with cooking/housework;A lot of help with bathing/dressing/bathroom;A lot of help with walking and/or transfers     Functional Status Assessment   Patient has had a recent decline in their functional status and demonstrates the ability to make significant improvements in function in a reasonable and predictable amount of time.     Equipment Recommendations   Other (comment)      Precautions/Restrictions   Precautions Precautions: Fall Recall of Precautions/Restrictions: Impaired Restrictions Weight Bearing Restrictions Per Provider Order: No     Mobility Bed Mobility Overal bed mobility: Needs Assistance Bed Mobility: Supine to Sit           General bed mobility comments: pt initated bed mobility with cues, no physical assist, but does not fully transition to EOB    Transfers Overall transfer level: Needs assistance                 General transfer comment: NT      Balance               Standing balance comment: NT                           ADL either performed or assessed with clinical judgement   ADL Overall ADL's : Needs assistance/impaired Eating/Feeding: Minimal assistance Eating/Feeding Details (indicate cue type and reason): anticipate minA due to current mentation Grooming: Wash/dry hands;Wash/dry face;Bed level Grooming Details (indicate cue type and reason): OT provides washcloth and cues for pt to wash face, pt instead reaches to tap washcloth to feet bed level  General ADL Comments: Pt limited by poor mentation/cognitive status on eval; pt begins to initiate bed mobility to transition to seated but stops halfway through "I'm done", and refuses to participate further.      Pertinent Vitals/Pain Pain Assessment Pain Assessment: Faces Faces Pain Scale: Hurts little more Pain Location:  LBP Pain Descriptors / Indicators: Guarding, Grimacing, Discomfort Pain Intervention(s): Limited activity within patient's tolerance, Monitored during session     Extremity/Trunk Assessment Upper Extremity Assessment Upper Extremity Assessment: Difficult to assess due to impaired cognition (pt follows commands minimally, observed to touch head and was able to reach down to adjust socks bed level)   Lower Extremity Assessment Lower Extremity Assessment: Difficult to assess due to impaired cognition (able to raise BLE off bed and hold up for ~5 sec)       Communication Communication Communication: No apparent difficulties Factors Affecting Communication: Difficulty expressing self   Cognition Arousal: Alert Behavior During Therapy: Flat affect Cognition: History of cognitive impairments   Orientation impairments: Time, Situation, Place   Memory impairment (select all impairments): Short-term memory Attention impairment (select first level of impairment): Focused attention Executive functioning impairment (select all impairments): Initiation, Sequencing, Reasoning, Problem solving, Organization OT - Cognition Comments: Oriented to person, place, situation but generally confused, not following commands                 Following commands: Impaired Following commands impaired: Follows one step commands inconsistently     Cueing  General Comments   Cueing Techniques: Verbal cues;Gestural cues;Tactile cues;Visual cues              Home Living Family/patient expects to be discharged to:: Group home Living Arrangements: Group Home Available Help at Discharge: Personal care attendant Type of Home: Group Home Home Access: Ramped entrance     Home Layout: One level     Bathroom Shower/Tub: Producer, television/film/video: Handicapped height     Home Equipment: Agricultural consultant (2 wheels);Educational psychologist (4 wheels)   Additional Comments: per chart review, pt  poor historian      Prior Functioning/Environment Prior Level of Function : Needs assist;History of Falls (last six months)             Mobility Comments: Per recent hospitalization notes, pt was modified independent ambulating limtied community distances with RW; h/o falls ADLs Comments: Per recent hospitalization notes, group home staff assists with ADL's    OT Problem List: Decreased coordination;Decreased cognition;Decreased safety awareness;Decreased knowledge of use of DME or AE;Decreased strength;Decreased activity tolerance;Impaired balance (sitting and/or standing);Decreased knowledge of precautions;Pain   OT Treatment/Interventions: Self-care/ADL training;Therapeutic activities;DME and/or AE instruction;Cognitive remediation/compensation;Energy conservation;Balance training;Therapeutic exercise;Neuromuscular education;Patient/family education      OT Goals(Current goals can be found in the care plan section)   Acute Rehab OT Goals OT Goal Formulation: Patient unable to participate in goal setting Time For Goal Achievement: 12/06/23 Potential to Achieve Goals: Fair   OT Frequency:  Min 2X/week       AM-PAC OT "6 Clicks" Daily Activity     Outcome Measure Help from another person eating meals?: A Little Help from another person taking care of personal grooming?: A Little Help from another person toileting, which includes using toliet, bedpan, or urinal?: A Lot Help from another person bathing (including washing, rinsing, drying)?: A Lot Help from another person to put on and taking off regular upper body clothing?: A Lot Help from another person to put on and taking off regular lower body clothing?:  A Lot 6 Click Score: 14   End of Session Nurse Communication: Mobility status  Activity Tolerance: Other (comment) (treatment limited by current mentation/cognitive status) Patient left: in bed;with call bell/phone within reach;with bed alarm set  OT Visit Diagnosis:  Unsteadiness on feet (R26.81);Other abnormalities of gait and mobility (R26.89);Muscle weakness (generalized) (M62.81);Other symptoms and signs involving cognitive function                Time: 1610-9604 OT Time Calculation (min): 16 min Charges:  OT General Charges $OT Visit: 1 Visit OT Evaluation $OT Eval Low Complexity: 1 Low  Kenyatte Gruber L. Kaston Faughn, OTR/L  11/22/23, 2:17 PM

## 2023-11-22 NOTE — Progress Notes (Signed)
 PT Cancellation Note  Patient Details Name: HILMAN KISSLING MRN: [id number] DOB: 06-22-1951   Cancelled Treatment:    Reason Eval/Treat Not Completed: Fatigue/lethargy limiting ability to participate Orders received, chart reviewed. Per OT, patient lethargic and not following commands. Will hold until cognitively more appropriate.   Maylon Peppers, PT, DPT Physical Therapist - [facility]  [facility]    Dhruva Orndoff A Kian Ottaviano 11/22/2023, 12:53 PM

## 2023-11-22 NOTE — Care Management Obs Status (Signed)
 MEDICARE OBSERVATION STATUS NOTIFICATION   Patient Details  Name: [name] MRN: [id number] Date of Birth: November 17, 1950   Medicare Observation Status Notification Given:  Yes    Cherre Blanc, RN 11/22/2023, 12:02 PM

## 2023-11-22 NOTE — Care Management Obs Status (Signed)
 MEDICARE OBSERVATION STATUS NOTIFICATION   Patient Details  Name: [name] MRN: [id number] Date of Birth: 1951/02/23   Medicare Observation Status Notification Given:  Yes    Cherre Blanc, RN 11/22/2023, 11:52 AM

## 2023-11-22 NOTE — TOC Initial Note (Addendum)
 Transition of Care [facility]) - Initial/Assessment Note    Patient Details  Name: [name] MRN: [id number] Date of Birth: 1951/01/29  Transition of Care [facility] CM/SW Contact:    Cherre Blanc, RN Phone Number: 11/22/2023, 2:42 PM  Clinical Narrative:                  Patient with recent hospital discharge one week ago.  Patient was discharged last week back to [facility] with Home Health PT/OT via [facility].  TOC spoke with Doristine Mango [phone] at the group home about guardianship. He has spoken with the patient about needing guardianship. Doristine Mango will initiate the process once the patient is discharged.   Doristine Mango will transport the patient when he is medically clear for dc.   TOC spoke with Elnita Maxwell [phone] and they will resume HH PT/OT  TOC will continue to follow       Patient Goals and CMS Choice            Expected Discharge Plan and Services                                              Prior Living Arrangements/Services                       Activities of Daily Living   ADL Screening (condition at time of admission) Independently performs ADLs?: Yes (appropriate for developmental age) Does the patient have a NEW difficulty with bathing/dressing/toileting/self-feeding that is expected to last >3 days?: No Does the patient have a NEW difficulty with getting in/out of bed, walking, or climbing stairs that is expected to last >3 days?: No Does the patient have a NEW difficulty with communication that is expected to last >3 days?: No Is the patient deaf or have difficulty hearing?: No Does the patient have difficulty seeing, even when wearing glasses/contacts?: No Does the patient have difficulty concentrating, remembering, or making decisions?: Yes  Permission Sought/Granted                  Emotional Assessment              Admission diagnosis:  Hepatic encephalopathy (HCC) [K76.82] Acute hepatic encephalopathy (HCC)  [K76.82] Patient Active Problem List   Diagnosis Date Noted   Altered mental status 11/12/2023   Type II diabetes mellitus with renal manifestations (HCC) 10/21/2023   Chronic kidney disease, stage 3a (HCC) 10/21/2023   Myocardial injury 10/21/2023   CKD stage 3a, GFR 45-59 ml/min (HCC) - baseline scr 1.3 10/01/2023   Hepatic encephalopathy (HCC) 09/30/2023   Liver cirrhosis (HCC) 09/30/2023   Essential hypertension 09/30/2023   Diabetes mellitus type 2, noninsulin dependent (HCC) 09/30/2023   CAD (coronary artery disease) 09/30/2023   Hyperlipidemia 09/30/2023   Hepatic encephalopathy (HCC) 08/01/2023   Hyperammonemia (HCC) 07/30/2023   Cervical spinal stenosis 07/01/2023   Cervical myelopathy (HCC) 06/21/2023   Cervical stenosis of spine 06/21/2023   Cervical radiculopathy 06/20/2023   E coli bacteremia 06/17/2023   Left leg cellulitis 03/23/2023   Leukocytosis 03/23/2023   Serum total bilirubin elevated 03/23/2023   Left leg pain 03/23/2023   Suicidal ideation 01/14/2023   Acute pain 01/14/2023   Adrenal insufficiency (HCC) 12/26/2022   Orthostatic hypotension 12/24/2022   Rib pain on right side 12/24/2022   Hepatitis B infection 12/24/2022   Myocardial ischemia  12/24/2022   Acute appendicitis 11/05/2022   Pancytopenia (HCC) 11/05/2022   Sinus bradycardia 11/05/2022   Acute metabolic encephalopathy 09/22/2022   Liver lesion 09/22/2022   Hyperlipidemia, unspecified 09/22/2022   Type 2 diabetes mellitus with hyperlipidemia (HCC) 09/22/2022   Obesity (BMI 30-39.9) 09/22/2022   Acute hepatic encephalopathy (HCC) 09/22/2022   MDD (major depressive disorder), recurrent severe, 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   Major depressive disorder, 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 psychotic features (HCC) 09/15/2022   Protein-calorie malnutrition, severe (HCC) 08/31/2022   Hypotension 08/31/2022   Weakness    Hx of transient ischemic attack (TIA) 04/28/2022   Obesity    Chronic diastolic  CHF (congestive heart failure) (HCC)    Depression    Sepsis (HCC) 03/28/2022   Bacteremia due to Klebsiella pneumoniae 03/28/2022   Hypoglycemia 03/28/2022   Syncope 03/24/2022   Hypokalemia 03/24/2022   Fever 03/24/2022   COPD (chronic obstructive pulmonary disease) (HCC)    Elevated troponin    Abnormal LFTs    History of hepatitis C    AKI (acute kidney injury) (HCC)    Pure hypercholesterolemia    Obesity, Class III, BMI 40-49.9 (morbid obesity) (HCC) 04/18/2020   Thrombocytopenia (HCC) 04/17/2020   Leukopenia 04/17/2020   HTN (hypertension) 04/17/2018   Uncontrolled type 2 diabetes mellitus with hyperglycemia, without long-term current use of insulin (HCC) 04/17/2018   Lymphedema 04/17/2018   Normocytic anemia 04/07/2018   Chest pain 03/20/2018   Unsheltered homelessness 12/16/2017   PCP:  [facility] Pharmacy:   [id number] [address]), [place] - [address] [place]) [place] [address] Phone: [phone] Fax: [phone]  [facility] [address] [place] [address] Phone: [phone] Fax: [phone]  [facility] - [place], [place] - [place] [address] [place] [address] Phone: [phone] Fax: [phone]     Social Drivers of Health (SDOH) Social History: SDOH Screenings   Food Insecurity: Unknown (11/21/2023)  Housing: Unknown (11/21/2023)  Transportation Needs: No Transportation Needs (11/21/2023)  Utilities: Not At Risk (11/21/2023)  Alcohol Screen: Low Risk  (09/24/2022)  Depression (PHQ2-9): High Risk (04/26/2022)  Financial Resource Strain: High Risk (04/27/2022)  Physical Activity: Sufficiently Active (04/16/2018)  Social Connections: Socially Isolated (11/21/2023)  Stress: Stress Concern Present (04/16/2018)  Tobacco Use: High Risk (11/21/2023)   SDOH Interventions:      Readmission Risk Interventions    11/13/2023    2:21 PM 11/12/2023    4:00 PM 06/18/2023   12:36 PM  Readmission Risk Prevention Plan  Transportation Screening  Not Complete Complete  Medication Review Oceanographer) Complete Not Complete Complete  PCP or Specialist appointment within 3-5 days of discharge Complete Complete Complete  HRI or Home Care Consult Complete  Complete  SW Recovery Care/Counseling Consult Complete  Not Complete  Palliative Care Screening Complete  Not Complete  Skilled Nursing Facility Not Applicable  Complete

## 2023-11-23 DIAGNOSIS — K7682 Hepatic encephalopathy: Secondary | ICD-10-CM | POA: Diagnosis not present

## 2023-11-23 LAB — GLUCOSE, CAPILLARY
Glucose-Capillary: 82 mg/dL (ref 70–99)
Glucose-Capillary: 107 mg/dL — ABNORMAL HIGH (ref 70–99)

## 2023-11-23 NOTE — Plan of Care (Signed)

## 2023-11-23 NOTE — Evaluation (Signed)
 Physical Therapy Evaluation Patient Details Name: [name] MRN: [id number] DOB: 04/03/1951 Today's Date: 11/23/2023  History of Present Illness  73 y.o. male presented to ED from group home with AMS and possible seizure. PMH significant for hepatitis C, liver cirrhosis, hepatic encephalopathy recurrent, TIA, chronic HFpEF, CAD, COPD, IIDM, HTN, HLD, and C3-C6 ACDF on 07/01/23 , homelessness. Recently hospitalized from 3/6 to 3/13 for hepatic encephalopathy with similar presentation and improvement to baseline with lactulose therapy. Multiple prior admissions with most recent discharge 3/21, returned to ED same day after pt cut Foley cath tubing.   Clinical Impression  Pt is a 73 year old M admitted to hospital on 11/21/23 for acute hepatic encephalopathy. At baseline, pt used RW for limited community ambulation and had assist from group home staff for ADL's, IADL's, and medication management. Hx of falls with RW.   Pt presents with impulsivity, impaired safety awareness, decreased standing balance, and decreased activity tolerance, resulting in impaired functional mobility from baseline. Due to deficits, pt required supervision for bed mobility, SBA-CGA for transfers with RW, and CGA to ambulate 66ft x2 with RW. Increased time/effort required during session for toileting and pericare; able to be supervision with pericare but required follow up to ensure clean skin integrity.   Deficits limit the pt's ability to safely and independently perform ADL's, transfer, and ambulate. Pt will benefit from acute skilled PT services to address deficits for return to baseline function. Pt will benefit from post acute therapy services to address deficits for return to baseline function.         If plan is discharge home, recommend the following: A little help with bathing/dressing/bathroom;Assistance with cooking/housework;Direct supervision/assist for medications management;Assist for transportation;Help  with stairs or ramp for entrance   Can travel by private vehicle   Yes    Equipment Recommendations None recommended by PT     Functional Status Assessment Patient has had a recent decline in their functional status and demonstrates the ability to make significant improvements in function in a reasonable and predictable amount of time.     Precautions / Restrictions Precautions Precautions: Fall Recall of Precautions/Restrictions: Impaired Restrictions Weight Bearing Restrictions Per Provider Order: No      Mobility  Bed Mobility         Supine to sit: Supervision Sit to supine: Supervision   General bed mobility comments: Supervision for safety to perform supine<>sit transfer with HOB slightly elevated, use of BUE for support. Increased time/effort.    Transfers Overall transfer level: Needs assistance Equipment used: Rolling walker (2 wheels)   Sit to Stand: Supervision, Contact guard assist           General transfer comment: Impulsively stands from recliner with RW at supervision level; otherwise CGA provided for safety with transfers from low height commode and EOB with RW. Unable to follow commands for proper hand placement without hand over hand cues.    Ambulation/Gait Ambulation/Gait assistance: Contact guard assist Gait Distance (Feet): 40 Feet (49ft x2 (EOB>bathroom>EOB)) Assistive device: Rolling walker (2 wheels)         General Gait Details: CGA for safety to ambulate in room with RW. Demonstrates early reciprocal gait, decreased step length/foot clearance bilaterally, slowed cadence, and decreased RW proximity. Max multimodal cues for safety, sequencing, and RW proximity.     Balance Overall balance assessment: Needs assistance Sitting-balance support: Feet supported Sitting balance-Leahy Scale: Good Sitting balance - Comments: unable to don socks due to impaired fine motor control, but able  to complete figure 4 position without LOB    Standing balance support: During functional activity, Bilateral upper extremity supported Standing balance-Leahy Scale: Fair Standing balance comment: standing balance in RW                             Pertinent Vitals/Pain Pain Assessment Pain Assessment: No/denies pain    Home Living Family/patient expects to be discharged to:: Group home Living Arrangements: Group Home Available Help at Discharge: Personal care attendant Type of Home: Group Home Home Access: Ramped entrance       Home Layout: One level Home Equipment: Agricultural consultant (2 wheels);Educational psychologist (4 wheels) Additional Comments: per chart review, pt poor historian    Prior Function Prior Level of Function : Needs assist;History of Falls (last six months)             Mobility Comments: Pt was modified independent ambulating limtied community distances with RW; h/o falls ADLs Comments: Group home staff assists with ADL's, IADL's, and medication management     Extremity/Trunk Assessment   Upper Extremity Assessment Upper Extremity Assessment: Overall WFL for tasks assessed (shoulder flexion and elbow extension 3+/5 bilaterally; elbow flex 4+/5 and grip intact. Sensation intact.)    Lower Extremity Assessment Lower Extremity Assessment: Overall WFL for tasks assessed (bilateral hip flexion 4/5, otherwise grossly 4+/5. Sensation intact.)    Cervical / Trunk Assessment Cervical / Trunk Assessment: Normal  Communication   Communication Communication: No apparent difficulties    Cognition Arousal: Alert Behavior During Therapy: WFL for tasks assessed/performed   PT - Cognitive impairments: History of cognitive impairments                       PT - Cognition Comments: A&O x3 (States he does not keep up with time) Following commands: Impaired Following commands impaired: Follows multi-step commands with increased time     Cueing Cueing Techniques: Verbal cues, Gestural  cues, Tactile cues, Visual cues     General Comments General comments (skin integrity, edema, etc.): pleasant and cooperative but impulsive; easily redirected with multimodal cues    Exercises Other Exercises Other Exercises: Pt educated re: PT role/POC, DC recommendations, safety with functional mobility, use of RW, call for help. He verbalized understanding.   Assessment/Plan    PT Assessment Patient needs continued PT services  PT Problem List Decreased activity tolerance;Decreased balance;Decreased mobility;Decreased cognition;Decreased safety awareness       PT Treatment Interventions Gait training;Therapeutic activities;Therapeutic exercise;Balance training;Cognitive remediation;Patient/family education;DME instruction;Functional mobility training;Neuromuscular re-education    PT Goals (Current goals can be found in the Care Plan section)  Acute Rehab PT Goals Patient Stated Goal: TO return to group home. PT Goal Formulation: With patient Time For Goal Achievement: 12/07/23 Potential to Achieve Goals: Good    Frequency Min 1X/week        AM-PAC PT "6 Clicks" Mobility  Outcome Measure Help needed turning from your back to your side while in a flat bed without using bedrails?: A Little Help needed moving from lying on your back to sitting on the side of a flat bed without using bedrails?: A Little Help needed moving to and from a bed to a chair (including a wheelchair)?: A Little Help needed standing up from a chair using your arms (e.g., wheelchair or bedside chair)?: A Little Help needed to walk in hospital room?: A Little Help needed climbing 3-5 steps with a railing? : A Little  6 Click Score: 18    End of Session Equipment Utilized During Treatment: Gait belt Activity Tolerance: Patient tolerated treatment well Patient left: with call bell/phone within reach;in bed;with bed alarm set;with nursing/sitter in room Nurse Communication: Mobility status PT Visit  Diagnosis: Other abnormalities of gait and mobility (R26.89);History of falling (Z91.81)    Time: 1610-9604 PT Time Calculation (min) (ACUTE ONLY): 30 min   Charges:   PT Evaluation $PT Eval Low Complexity: 1 Low PT Treatments $Therapeutic Activity: 8-22 mins PT General Charges $$ ACUTE PT VISIT: 1 Visit       Vira Blanco, PT, DPT 12:43 PM,11/23/23 Physical Therapist - [facility]

## 2023-11-23 NOTE — Progress Notes (Incomplete)
 {  Select_TRH_Note:[id number]}

## 2023-11-23 NOTE — TOC Transition Note (Signed)
 Transition of Care [facility]) - Discharge Note   Patient Details  Name: [name] MRN: [id number] Date of Birth: 05/01/1951  Transition of Care [facility] CM/SW Contact:  Liliana Cline, LCSW Phone Number: 11/23/2023, 12:59 PM   Clinical Narrative:    Patient to DC back to group home today. Confirmed with [employer]. Doristine Mango will pick patient up around 1:30 pm. RN and MD aware. Notified Cheryl with [facility] of DC today. FL2 with DC Medication list completed and hard copy to be sent with the patient per [name]'s request.   Final next level of care: Group Home Barriers to Discharge: Barriers Resolved   Patient Goals and CMS Choice            Discharge Placement                Patient to be transferred to facility by: Doristine Mango - group home staff   Patient and family notified of of transfer: 11/23/23  Discharge Plan and Services Additional resources added to the After Visit Summary for                            [facility] Arranged: PT, OT [facility] Agency: [facility] Date [facility] Agency Contacted: 11/23/23   Representative spoke with at [facility] Agency: Elnita Maxwell  Social Drivers of Health (SDOH) Interventions SDOH Screenings   Food Insecurity: Unknown (11/21/2023)  Housing: Unknown (11/21/2023)  Transportation Needs: No Transportation Needs (11/21/2023)  Utilities: Not At Risk (11/21/2023)  Alcohol Screen: Low Risk  (09/24/2022)  Depression (PHQ2-9): High Risk (04/26/2022)  Financial Resource Strain: High Risk (04/27/2022)  Physical Activity: Sufficiently Active (04/16/2018)  Social Connections: Socially Isolated (11/21/2023)  Stress: Stress Concern Present (04/16/2018)  Tobacco Use: High Risk (11/21/2023)     Readmission Risk Interventions    11/13/2023    2:21 PM 11/12/2023    4:00 PM 06/18/2023   12:36 PM  Readmission Risk Prevention Plan  Transportation Screening  Not Complete Complete  Medication Review Oceanographer) Complete Not  Complete Complete  PCP or Specialist appointment within 3-5 days of discharge Complete Complete Complete  HRI or Home Care Consult Complete  Complete  SW Recovery Care/Counseling Consult Complete  Not Complete  Palliative Care Screening Complete  Not Complete  Skilled Nursing Facility Not Applicable  Complete

## 2023-11-23 NOTE — NC FL2 (Signed)
 [place] MEDICAID FL2 LEVEL OF CARE FORM     IDENTIFICATION  Patient Name: [name] Birthdate: 12-14-1950 Sex: male Admission Date (Current Location): 11/21/2023  [place] and [place] Number:  Chiropodist and Address:  [facility], [address] [place], [place] [address]      Provider Number: [id number]  Attending Physician Name and Address:  Lurene Shadow, MD  Relative Name and Phone Number:       Current Level of Care: Hospital Recommended Level of Care: [facility] Prior Approval Number:    Date Approved/Denied:   PASRR Number: [id number]  Discharge Plan:      Current Diagnoses: Patient Active Problem List   Diagnosis Date Noted   Altered mental status 11/12/2023   Type II diabetes mellitus with renal manifestations (HCC) 10/21/2023   Chronic kidney disease, stage 3a (HCC) 10/21/2023   Myocardial injury 10/21/2023   CKD stage 3a, GFR 45-59 ml/min (HCC) - baseline scr 1.3 10/01/2023   Hepatic encephalopathy (HCC) 09/30/2023   Liver cirrhosis (HCC) 09/30/2023   Essential hypertension 09/30/2023   Diabetes mellitus type 2, noninsulin dependent (HCC) 09/30/2023   CAD (coronary artery disease) 09/30/2023   Hyperlipidemia 09/30/2023   Hepatic encephalopathy (HCC) 08/01/2023   Hyperammonemia (HCC) 07/30/2023   Cervical spinal stenosis 07/01/2023   Cervical myelopathy (HCC) 06/21/2023   Cervical stenosis of spine 06/21/2023   Cervical radiculopathy 06/20/2023   E coli bacteremia 06/17/2023   Left leg cellulitis 03/23/2023   Leukocytosis 03/23/2023   Serum total bilirubin elevated 03/23/2023   Left leg pain 03/23/2023   Suicidal ideation 01/14/2023   Acute pain 01/14/2023   Adrenal insufficiency (HCC) 12/26/2022   Orthostatic hypotension 12/24/2022   Rib pain on right side 12/24/2022   Hepatitis B infection 12/24/2022   Myocardial ischemia 12/24/2022   Acute appendicitis 11/05/2022   Pancytopenia (HCC)  11/05/2022   Sinus bradycardia 11/05/2022   Acute metabolic encephalopathy 09/22/2022   Liver lesion 09/22/2022   Hyperlipidemia, unspecified 09/22/2022   Type 2 diabetes mellitus with hyperlipidemia (HCC) 09/22/2022   Obesity (BMI 30-39.9) 09/22/2022   Acute hepatic encephalopathy (HCC) 09/22/2022   MDD (major depressive disorder), recurrent severe, 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   Major depressive disorder, 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 psychotic features (HCC) 09/15/2022   Protein-calorie malnutrition, severe (HCC) 08/31/2022   Hypotension 08/31/2022   Weakness    Hx of transient ischemic attack (TIA) 04/28/2022   Obesity    Chronic diastolic CHF (congestive heart failure) (HCC)    Depression    Sepsis (HCC) 03/28/2022   Bacteremia due to Klebsiella pneumoniae 03/28/2022   Hypoglycemia 03/28/2022   Syncope 03/24/2022   Hypokalemia 03/24/2022   Fever 03/24/2022   COPD (chronic obstructive pulmonary disease) (HCC)    Elevated troponin    Abnormal LFTs    History of hepatitis C    AKI (acute kidney injury) (HCC)    Pure hypercholesterolemia    Obesity, Class III, BMI 40-49.9 (morbid obesity) (HCC) 04/18/2020   Thrombocytopenia (HCC) 04/17/2020   Leukopenia 04/17/2020   HTN (hypertension) 04/17/2018   Uncontrolled type 2 diabetes mellitus with hyperglycemia, without long-term current use of insulin (HCC) 04/17/2018   Lymphedema 04/17/2018   Normocytic anemia 04/07/2018   Chest pain 03/20/2018   Unsheltered homelessness 12/16/2017    Orientation RESPIRATION BLADDER Height & Weight     Self, Place, Situation  Normal External catheter, Incontinent Weight: 181 lb (82.1 kg) Height:  5\' 6"  (167.6 cm)  BEHAVIORAL SYMPTOMS/MOOD NEUROLOGICAL BOWEL  NUTRITION STATUS      Incontinent Diet (dys 2)  AMBULATORY STATUS COMMUNICATION OF NEEDS Skin   Limited Assist Verbally Other (Comment) (redness)                       Personal Care Assistance Level of Assistance  Bathing,  Feeding, Dressing Bathing Assistance: Limited assistance Feeding assistance: Limited assistance Dressing Assistance: Limited assistance     Functional Limitations Info    Sight Info: Adequate Hearing Info: Adequate Speech Info: Adequate    SPECIAL CARE FACTORS FREQUENCY  PT (By licensed PT), OT (By licensed OT)     PT Frequency: home health OT Frequency: home health            Contractures      Additional Factors Info  Code Status Code Status Info: Limited: Do not attempt resuscitation (DNR) -DNR-LIMITED -Do Not Intubate/DNI Allergies Info: Bee Venom, Codeine, Rsv Mrna Pre-f Virus Vaccine, Meloxicam, Codeine, Novocain (Procaine), Sulfa Antibiotics, Sulfa Antibiotics           DISCHARGE MEDICATION: Allergies as of 11/23/2023         Reactions    Bee Venom Anaphylaxis    Codeine Itching    Only itching. Recently allergy tested at [facility]    Rsv Mrna Pre-f Virus Vaccine Swelling    Meloxicam      Other Reaction(s): ?overdose of local given at dentist--had trouble breathing    Codeine      Novocain [procaine]      Sulfa Antibiotics Other (See Comments)    Sulfa Antibiotics      hallucinations            Medication List       STOP taking these medications     Jardiance 10 MG Tabs tablet Generic drug: empagliflozin           TAKE these medications     acetaminophen 650 MG CR tablet Commonly known as: TYLENOL Take 650 mg by mouth every 8 (eight) hours as needed for pain.    aspirin 81 MG chewable tablet Chew 81 mg by mouth daily.    atorvastatin 40 MG tablet Commonly known as: LIPITOR Take 40 mg by mouth daily.    citalopram 20 MG tablet Commonly known as: CELEXA Take 20 mg by mouth daily.    feeding supplement Liqd Take 237 mLs by mouth 3 (three) times daily between meals.    furosemide 20 MG tablet Commonly known as: LASIX Take 1 tablet (20 mg total) by mouth daily.    gabapentin 300 MG capsule Commonly known as: NEURONTIN Take 300 mg by  mouth 2 (two) times daily.    lactulose 10 GM/15ML solution Commonly known as: CHRONULAC Take 45 mLs (30 g total) by mouth 3 (three) times daily.    losartan 50 MG tablet Commonly known as: COZAAR Take 50 mg by mouth daily.    multivitamin with minerals Tabs tablet Take 1 tablet by mouth daily.    nitroGLYCERIN 0.4 MG/SPRAY spray Commonly known as: NITROLINGUAL Place 1 spray under the tongue every 5 (five) minutes x 3 doses as needed for chest pain.    ondansetron 4 MG tablet Commonly known as: ZOFRAN Take 1 tablet (4 mg total) by mouth every 6 (six) hours as needed for nausea.    pantoprazole 40 MG tablet Commonly known as: PROTONIX Take 40 mg by mouth daily.    phenol 1.4 % Liqd Commonly known as: CHLORASEPTIC Use as directed 1  spray in the mouth or throat as needed for throat irritation / pain.    QUEtiapine 50 MG tablet Commonly known as: SEROQUEL Take 50 mg by mouth at bedtime.    senna 8.6 MG Tabs tablet Commonly known as: SENOKOT Take 1 tablet (8.6 mg total) by mouth 2 (two) times daily as needed for mild constipation.    spironolactone 25 MG tablet Commonly known as: ALDACTONE Take 25 mg by mouth daily.    Ventolin HFA 108 (90 Base) MCG/ACT inhaler Generic drug: albuterol Inhale 2 puffs into the lungs every 4 (four) hours as needed for wheezing.    albuterol (2.5 MG/3ML) 0.083% nebulizer solution Commonly known as: PROVENTIL Take 2.5 mg by nebulization every 4 (four) hours as needed for wheezing or shortness of breath.    Xifaxan 550 MG Tabs tablet Generic drug: rifaximin Take 550 mg by mouth 2 (two) times daily.     Relevant Imaging Results:  Relevant Lab Results:   Additional Information [id number]  Shawnita Krizek E Drew Popoff, LCSW

## 2023-11-23 NOTE — Discharge Summary (Signed)
 Physician Discharge Summary   Patient: [name] MRN: [id number] DOB: [birth year]  Admit date:     11/21/2023  Discharge date: 11/23/23  Discharge Physician: Lurene Shadow   PCP: [facility]   Recommendations at discharge:   Follow-up with PCP in 1 week  Discharge Diagnoses: Principal Problem:   Acute hepatic encephalopathy (HCC) Active Problems:   Hx of transient ischemic attack (TIA)   CAD (coronary artery disease)   Pancytopenia (HCC)   Liver cirrhosis (HCC)   COPD (chronic obstructive pulmonary disease) (HCC)   Chronic diastolic CHF (congestive heart failure) (HCC)   Hypokalemia   Hypoglycemia  Resolved Problems:   * No resolved hospital problems. *  Hospital Course:  [name] is a 73 y.o. male with medical history significant for homelessness, depression, anxiety, hypertension, hyperlipidemia, diabetes mellitus, COPD, chronic diastolic CHF, TIA, migraine headache, hepatitis C, liver cirrhosis, liver lesion (suspected cancer), chronic thrombocytopenia, who presented from the group home to the ED because of altered mental status.  He is lethargic and confused and unable to provide any history at this time.  History was obtained from Dr. Fuller Plan, and ED physician.   ED Course:  The patient was noted to be bradycardic in the ED.  Otherwise vital signs are okay.  Labs significant for ammonia of 133.    Assessment and Plan:   Recurrent acute metabolic encephalopathy: Improved.  Ammonia down from 133-75.  Mental status is back to baseline.  Continue lactulose and rifaximin.     Decompensated liver cirrhosis: Continue Lasix and Aldactone     Recurrent hypoglycemia: Discontinue Jardiance at discharge.    Sinus bradycardia: Asymptomatic.  Heart rate mainly in the 40s and 50s.       Chronic pancytopenia: This is likely from hepatic encephalopathy.     Dysphagia: Speech therapist recommended dysphagia 2 diet. Aspiration precautions.      General Weakness: Home health PT and OT at discharge     Comorbidities include CAD, bilateral carotid artery disease, COPD, chronic diastolic CHF, depression, hypertension, hyperlipidemia, history of TIA     Consulted palliative care team again because of recurrent hospitalizations and chronic medical issues.  Patient might need a legal guardian to help him make complex decisions.  This can be pursued at the group home.        Consultants: None Procedures performed: None Disposition: Group home Diet recommendation:  Discharge Diet Orders (From admission, onward)     Start     Ordered   11/23/23 0000  Diet - low sodium heart healthy        11/23/23 1240           Dysphagia type 2 thin Liquid DISCHARGE MEDICATION: Allergies as of 11/23/2023       Reactions   Bee Venom Anaphylaxis   Codeine Itching   Only itching. Recently allergy tested at [facility]   Rsv Mrna Pre-f Virus Vaccine Swelling   Meloxicam    Other Reaction(s): ?overdose of local given at dentist--had trouble breathing   Codeine    Novocain [procaine]    Sulfa Antibiotics Other (See Comments)   Sulfa Antibiotics    hallucinations        Medication List     STOP taking these medications    Jardiance 10 MG Tabs tablet Generic drug: empagliflozin       TAKE these medications    acetaminophen 650 MG CR tablet Commonly known as: TYLENOL Take 650 mg by mouth every 8 (eight)  hours as needed for pain.   aspirin 81 MG chewable tablet Chew 81 mg by mouth daily.   atorvastatin 40 MG tablet Commonly known as: LIPITOR Take 40 mg by mouth daily.   citalopram 20 MG tablet Commonly known as: CELEXA Take 20 mg by mouth daily.   feeding supplement Liqd Take 237 mLs by mouth 3 (three) times daily between meals.   furosemide 20 MG tablet Commonly known as: LASIX Take 1 tablet (20 mg total) by mouth daily.   gabapentin 300 MG capsule Commonly known as: NEURONTIN Take 300 mg by mouth 2 (two) times  daily.   lactulose 10 GM/15ML solution Commonly known as: CHRONULAC Take 45 mLs (30 g total) by mouth 3 (three) times daily.   losartan 50 MG tablet Commonly known as: COZAAR Take 50 mg by mouth daily.   multivitamin with minerals Tabs tablet Take 1 tablet by mouth daily.   nitroGLYCERIN 0.4 MG/SPRAY spray Commonly known as: NITROLINGUAL Place 1 spray under the tongue every 5 (five) minutes x 3 doses as needed for chest pain.   ondansetron 4 MG tablet Commonly known as: ZOFRAN Take 1 tablet (4 mg total) by mouth every 6 (six) hours as needed for nausea.   pantoprazole 40 MG tablet Commonly known as: PROTONIX Take 40 mg by mouth daily.   phenol 1.4 % Liqd Commonly known as: CHLORASEPTIC Use as directed 1 spray in the mouth or throat as needed for throat irritation / pain.   QUEtiapine 50 MG tablet Commonly known as: SEROQUEL Take 50 mg by mouth at bedtime.   senna 8.6 MG Tabs tablet Commonly known as: SENOKOT Take 1 tablet (8.6 mg total) by mouth 2 (two) times daily as needed for mild constipation.   spironolactone 25 MG tablet Commonly known as: ALDACTONE Take 25 mg by mouth daily.   Ventolin HFA 108 (90 Base) MCG/ACT inhaler Generic drug: albuterol Inhale 2 puffs into the lungs every 4 (four) hours as needed for wheezing.   albuterol (2.5 MG/3ML) 0.083% nebulizer solution Commonly known as: PROVENTIL Take 2.5 mg by nebulization every 4 (four) hours as needed for wheezing or shortness of breath.   Xifaxan 550 MG Tabs tablet Generic drug: rifaximin Take 550 mg by mouth 2 (two) times daily.        Follow-up Information     [facility] Follow up.   Why: hospital follow up Contact information: [address] [name] [address] [phone]         [facility] [place] Follow up.   Why: Agency will call to schedule Contact information: [address], [address] [place] [address] [phone]                 Discharge Exam: [name] Weights   11/21/23 2015  Weight: 82.1 kg   GEN: NAD SKIN: Warm and dry EYES: No pallor or icterus ENT: MMM CV: RRR PULM: CTA B ABD: soft, ND, NT, +BS CNS: AAO x 1 (person and place), non focal EXT: No edema or tenderness   Condition at discharge: stable  The results of significant diagnostics from this hospitalization (including imaging, microbiology, ancillary and laboratory) are listed below for reference.   Imaging Studies: CT HEAD WO CONTRAST ( ) Result Date: 11/21/2023 CLINICAL DATA:  Possible seizure.  Neuro deficit. EXAM: CT HEAD WITHOUT CONTRAST TECHNIQUE: Contiguous axial images were obtained from the base of the skull through the vertex without intravenous contrast. RADIATION DOSE REDUCTION: This exam was performed according to  the departmental dose-optimization program which includes automated exposure control, adjustment of the mA and/or kV according to patient size and/or use of iterative reconstruction technique. COMPARISON:  Head CT from 10 days ago FINDINGS: Brain: No evidence of acute infarction, hemorrhage, hydrocephalus, extra-axial collection or mass lesion/mass effect. Vascular: No hyperdense vessel or unexpected calcification. Skull: Normal. Negative for fracture or focal lesion. Sinuses/Orbits: No acute finding. IMPRESSION: Stable and negative head CT. Electronically Signed   By: Tiburcio Pea M.D.   On: 11/21/2023 11:20   CT Head Wo Contrast Result Date: 11/12/2023 CLINICAL DATA:  Mental status change, unknown cause, found unresponsive EXAM: CT HEAD WITHOUT CONTRAST TECHNIQUE: Contiguous axial images were obtained from the base of the skull through the vertex without intravenous contrast. RADIATION DOSE REDUCTION: This exam was performed according to the departmental dose-optimization program which includes automated exposure control, adjustment of the mA and/or kV according to patient size and/or use of iterative reconstruction  technique. COMPARISON:  11/04/2023 FINDINGS: Brain: No intracranial hemorrhage, mass effect, or evidence of acute infarct. No hydrocephalus. No extra-axial fluid collection. Age-commensurate cerebral atrophy and chronic small vessel ischemic disease. Vascular: No hyperdense vessel. Intracranial arterial calcification. Skull: No fracture or focal lesion. Sinuses/Orbits: No acute finding. Other: None. IMPRESSION: No acute intracranial abnormality. Electronically Signed   By: Minerva Fester M.D.   On: 11/12/2023 00:04   DG Chest Port 1 View Result Date: 11/11/2023 CLINICAL DATA:  Found unresponsive, confusion EXAM: PORTABLE CHEST 1 VIEW COMPARISON:  10/31/2023 FINDINGS: The heart size and mediastinal contours are within normal limits. Both lungs are clear. The visualized skeletal structures are unremarkable. IMPRESSION: No active disease. Electronically Signed   By: Sharlet Salina M.D.   On: 11/11/2023 23:41   DG Abd 1 View Result Date: 11/04/2023 CLINICAL DATA:  [id number] Encounter for imaging study to confirm nasogastric (NG) tube placement [id number] EXAM: ABDOMEN - 1 VIEW COMPARISON:  10/31/2023 FINDINGS: Nasogastric tube tip is in the left upper abdomen likely in the gastric body region. Left lung is clear. No gross abnormality to the heart and mediastinum. Possible calcifications in the right upper abdomen. IMPRESSION: Nasogastric tube tip is in the stomach. Electronically Signed   By: Richarda Overlie M.D.   On: 11/04/2023 14:30   CT HEAD WO CONTRAST ( ) Result Date: 11/04/2023 CLINICAL DATA:  Mental status change of unknown cause. Hepatic encephalopathy. EXAM: CT HEAD WITHOUT CONTRAST TECHNIQUE: Contiguous axial images were obtained from the base of the skull through the vertex without intravenous contrast. RADIATION DOSE REDUCTION: This exam was performed according to the departmental dose-optimization program which includes automated exposure control, adjustment of the mA and/or kV according to patient size  and/or use of iterative reconstruction technique. COMPARISON:  10/31/2023 FINDINGS: Brain: Age related volume loss. No evidence of old or acute focal infarction, mass lesion, hemorrhage, hydrocephalus or extra-axial collection. Vascular: There is atherosclerotic calcification of the major vessels at the base of the brain. Skull: Negative Sinuses/Orbits: Previous functional endoscopic sinus surgery. No active inflammation. Orbits negative. Other: None IMPRESSION: No acute CT finding. Age related volume loss. Atherosclerotic calcification of the major vessels at the base of the brain. Electronically Signed   By: Paulina Fusi M.D.   On: 11/04/2023 12:22   DG Abd Portable 1 View Result Date: 10/31/2023 CLINICAL DATA:  NG tube position EXAM: PORTABLE ABDOMEN - 1 VIEW limited for tube placement COMPARISON:  X-ray earlier 10/31/2023. FINDINGS: Limited x-ray for tube placement of the upper abdomen has tip overlying the midbody of the  stomach but side hole at the GE junction. This could be advanced further into the stomach. Elsewhere surgical clips in the right upper quadrant. Gas seen along nondilated loops of visualized small and large bowel. Scattered colonic stool. Overlapping cardiac leads. IMPRESSION: Limited x-ray for tube placement now has tip of the catheter overlying the mid stomach but the side hole at the GE junction. This could be advanced further into the stomach. Electronically Signed   By: Karen Kays M.D.   On: 10/31/2023 16:59   DG Abd Portable 1 View Result Date: 10/31/2023 CLINICAL DATA:  NG tube placement EXAM: PORTABLE ABDOMEN - 1 VIEW.  Limited for tube placement COMPARISON:  None Available. FINDINGS: Limited x-ray of the lower chest are upper abdomen demonstrates the enteric tube overlying the mediastinum and curved back upon itself, likely folded in the esophagus. Recommend this be removed and repositioned. In the upper abdomen note is made of moderate diffuse colonic stool. Gas is seen in  nondilated loops of bowel. Surgical clips in the right upper quadrant. Overlapping cardiac leads. Critical Value/emergent results were called by telephone at the time of interpretation on 10/31/2023 at 2:13 pm to provider [facility] , who verbally acknowledged these results. IMPRESSION: Limited x-ray of the lower chest are upper abdomen demonstrates the enteric tube overlying the mediastinum and curved back upon itself, likely folded in the esophagus. Recommend this be removed and repositioned. Electronically Signed   By: Karen Kays M.D.   On: 10/31/2023 14:13   CT Head Wo Contrast Result Date: 10/31/2023 CLINICAL DATA:  Mental status change, unknown cause EXAM: CT HEAD WITHOUT CONTRAST TECHNIQUE: Contiguous axial images were obtained from the base of the skull through the vertex without intravenous contrast. RADIATION DOSE REDUCTION: This exam was performed according to the departmental dose-optimization program which includes automated exposure control, adjustment of the mA and/or kV according to patient size and/or use of iterative reconstruction technique. COMPARISON:  10/21/2023 FINDINGS: Brain: No acute intracranial abnormality. Specifically, no hemorrhage, hydrocephalus, mass lesion, acute infarction, or significant intracranial injury. Vascular: No hyperdense vessel or unexpected calcification. Skull: No acute calvarial abnormality. Sinuses/Orbits: No acute findings Other: None IMPRESSION: No acute intracranial abnormality. Electronically Signed   By: Charlett Nose M.D.   On: 10/31/2023 11:43   DG Chest Port 1 View Result Date: 10/31/2023 CLINICAL DATA:  Altered mental status EXAM: PORTABLE CHEST 1 VIEW COMPARISON:  09/30/2023 FINDINGS: The heart size and mediastinal contours are within normal limits. Both lungs are clear. The visualized skeletal structures are unremarkable. IMPRESSION: No active disease. Electronically Signed   By: Charlett Nose M.D.   On: 10/31/2023 11:43     Microbiology: Results for orders placed or performed during the hospital encounter of 10/31/23  Culture, blood (routine x 2)     Status: None   Collection Time: 10/31/23  9:54 AM   Specimen: BLOOD  Result Value Ref Range Status   Specimen Description BLOOD BLOOD LEFT ARM  Final   Special Requests   Final    BOTTLES DRAWN AEROBIC ONLY Blood Culture results may not be optimal due to an inadequate volume of blood received in culture bottles   Culture   Final    NO GROWTH 5 DAYS Performed at [facility], [address] [place], [place] [address]    Report Status 11/05/2023 FINAL  Final  Culture, blood (routine x 2)     Status: None   Collection Time: 10/31/23  9:54 AM   Specimen: BLOOD  Result  Value Ref Range Status   Specimen Description BLOOD BLOOD RIGHT ARM  Final   Special Requests   Final    BOTTLES DRAWN AEROBIC AND ANAEROBIC Blood Culture results may not be optimal due to an inadequate volume of blood received in culture bottles   Culture   Final    NO GROWTH 5 DAYS Performed at [facility], [address] [place], [place] [address]    Report Status 11/05/2023 FINAL  Final  Resp panel by RT-PCR (RSV, Flu A&B, Covid) Anterior Nasal Swab     Status: None   Collection Time: 10/31/23 10:10 AM   Specimen: Anterior Nasal Swab  Result Value Ref Range Status   SARS Coronavirus 2 by RT PCR NEGATIVE NEGATIVE Final    Comment: (NOTE) SARS-CoV-2 target nucleic acids are NOT DETECTED.  The SARS-CoV-2 RNA is generally detectable in upper respiratory specimens during the acute phase of infection. The lowest concentration of SARS-CoV-2 viral copies this assay can detect is 138 copies/mL. A negative result does not preclude SARS-Cov-2 infection and should not be used as the sole basis for treatment or other patient management decisions. A negative result may occur with  improper specimen collection/handling, submission of specimen other than nasopharyngeal  swab, presence of viral mutation(s) within the areas targeted by this assay, and inadequate number of viral copies(<138 copies/mL). A negative result must be combined with clinical observations, patient history, and epidemiological information. The expected result is Negative.  Fact Sheet for Patients:  BloggerCourse.com  Fact Sheet for Healthcare Providers:  SeriousBroker.it  This test is no t yet approved or cleared by the [place] FDA and  has been authorized for detection and/or diagnosis of SARS-CoV-2 by FDA under an Emergency Use Authorization (EUA). This EUA will remain  in effect (meaning this test can be used) for the duration of the COVID-19 declaration under Section 564(b)(1) of the Act, 21 U.S.C.section 360bbb-3(b)(1), unless the authorization is terminated  or revoked sooner.       Influenza A by PCR NEGATIVE NEGATIVE Final   Influenza B by PCR NEGATIVE NEGATIVE Final    Comment: (NOTE) The Xpert Xpress SARS-CoV-2/FLU/RSV plus assay is intended as an aid in the diagnosis of influenza from Nasopharyngeal swab specimens and should not be used as a sole basis for treatment. Nasal washings and aspirates are unacceptable for Xpert Xpress SARS-CoV-2/FLU/RSV testing.  Fact Sheet for Patients: BloggerCourse.com  Fact Sheet for Healthcare Providers: SeriousBroker.it  This test is not yet approved or cleared by the [place] FDA and has been authorized for detection and/or diagnosis of SARS-CoV-2 by FDA under an Emergency Use Authorization (EUA). This EUA will remain in effect (meaning this test can be used) for the duration of the COVID-19 declaration under Section 564(b)(1) of the Act, 21 U.S.C. section 360bbb-3(b)(1), unless the authorization is terminated or revoked.     Resp Syncytial Virus by PCR NEGATIVE NEGATIVE Final    Comment: (NOTE) Fact Sheet for  Patients: BloggerCourse.com  Fact Sheet for Healthcare Providers: SeriousBroker.it  This test is not yet approved or cleared by the [place] FDA and has been authorized for detection and/or diagnosis of SARS-CoV-2 by FDA under an Emergency Use Authorization (EUA). This EUA will remain in effect (meaning this test can be used) for the duration of the COVID-19 declaration under Section 564(b)(1) of the Act, 21 U.S.C. section 360bbb-3(b)(1), unless the authorization is terminated or revoked.  Performed at [facility], [address], [place], [place] [address]  Labs: CBC: Recent Labs  Lab 11/21/23 0910 11/22/23 0458  WBC 2.3* 2.7*  HGB 9.8* 10.5*  HCT 28.1* 29.6*  MCV 91.8 91.6  PLT 67* 68*   Basic Metabolic Panel: Recent Labs  Lab 11/21/23 0910 11/22/23 0458  NA 138 140  K 3.7 3.6  CL 111 114*  CO2 23 20*  GLUCOSE 84 65*  BUN 21 21  CREATININE 1.21 1.08  CALCIUM 9.4 9.8   Liver Function Tests: Recent Labs  Lab 11/21/23 0910  AST 40  ALT 31  ALKPHOS 58  BILITOT 1.5*  PROT 5.7*  ALBUMIN 2.4*   CBG: Recent Labs  Lab 11/22/23 1719 11/22/23 2101 11/23/23 0736 11/23/23 1141  GLUCAP 137* 121* 82 107*    Discharge time spent: greater than 30 minutes.  Signed: Lurene Shadow, MD [facility] 11/23/2023

## 2023-11-28 ENCOUNTER — Telehealth: Payer: Self-pay | Admitting: Cardiology

## 2023-11-28 NOTE — Telephone Encounter (Signed)
 Caller Arman Filter) wanted to report patient HR 47 yesterday (4/2) and caregiver had to assist patient with feeding when normally he can feed himself.  Caller noted patient has appointment on 4/8.

## 2023-11-29 ENCOUNTER — Inpatient Hospital Stay

## 2023-11-29 ENCOUNTER — Other Ambulatory Visit: Payer: Self-pay

## 2023-11-29 ENCOUNTER — Encounter: Payer: Self-pay | Admitting: Internal Medicine

## 2023-11-29 ENCOUNTER — Inpatient Hospital Stay
Admission: EM | Admit: 2023-11-29 | Discharge: 2023-12-05 | DRG: 699 | Disposition: A | Discharge status: Home Health | Attending: Internal Medicine | Admitting: Internal Medicine

## 2023-11-29 ENCOUNTER — Emergency Department

## 2023-11-29 DIAGNOSIS — B1920 Unspecified viral hepatitis C without hepatic coma: Secondary | ICD-10-CM | POA: Diagnosis present

## 2023-11-29 DIAGNOSIS — Z5900 Homelessness unspecified: Secondary | ICD-10-CM

## 2023-11-29 DIAGNOSIS — Z8249 Family history of ischemic heart disease and other diseases of the circulatory system: Secondary | ICD-10-CM | POA: Diagnosis not present

## 2023-11-29 DIAGNOSIS — Z66 Do not resuscitate: Secondary | ICD-10-CM | POA: Diagnosis present

## 2023-11-29 DIAGNOSIS — F332 Major depressive disorder, 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 psychotic features: Secondary | ICD-10-CM | POA: Diagnosis present

## 2023-11-29 DIAGNOSIS — I10 Essential (primary) hypertension: Secondary | ICD-10-CM | POA: Diagnosis not present

## 2023-11-29 DIAGNOSIS — D696 Thrombocytopenia, unspecified: Secondary | ICD-10-CM | POA: Diagnosis not present

## 2023-11-29 DIAGNOSIS — F419 Anxiety disorder, unspecified: Secondary | ICD-10-CM | POA: Diagnosis present

## 2023-11-29 DIAGNOSIS — M5412 Radiculopathy, cervical region: Secondary | ICD-10-CM | POA: Diagnosis present

## 2023-11-29 DIAGNOSIS — I272 Pulmonary hypertension, unspecified: Secondary | ICD-10-CM | POA: Diagnosis present

## 2023-11-29 DIAGNOSIS — Z7982 Long term (current) use of aspirin: Secondary | ICD-10-CM

## 2023-11-29 DIAGNOSIS — K766 Portal hypertension: Secondary | ICD-10-CM | POA: Diagnosis present

## 2023-11-29 DIAGNOSIS — Z960 Presence of urogenital implants: Secondary | ICD-10-CM | POA: Diagnosis present

## 2023-11-29 DIAGNOSIS — R4182 Altered mental status, unspecified: Secondary | ICD-10-CM | POA: Diagnosis not present

## 2023-11-29 DIAGNOSIS — B961 Klebsiella pneumoniae [K. pneumoniae] as the cause of diseases classified elsewhere: Secondary | ICD-10-CM | POA: Diagnosis present

## 2023-11-29 DIAGNOSIS — F17210 Nicotine dependence, cigarettes, uncomplicated: Secondary | ICD-10-CM | POA: Diagnosis present

## 2023-11-29 DIAGNOSIS — Z8619 Personal history of other infectious and parasitic diseases: Secondary | ICD-10-CM | POA: Diagnosis not present

## 2023-11-29 DIAGNOSIS — Y828 Other medical devices associated with adverse incidents: Secondary | ICD-10-CM | POA: Diagnosis present

## 2023-11-29 DIAGNOSIS — E1165 Type 2 diabetes mellitus with hyperglycemia: Secondary | ICD-10-CM | POA: Diagnosis present

## 2023-11-29 DIAGNOSIS — R531 Weakness: Secondary | ICD-10-CM

## 2023-11-29 DIAGNOSIS — Z1611 Resistance to penicillins: Secondary | ICD-10-CM | POA: Diagnosis present

## 2023-11-29 DIAGNOSIS — Z882 Allergy status to sulfonamides status: Secondary | ICD-10-CM

## 2023-11-29 DIAGNOSIS — E782 Mixed hyperlipidemia: Secondary | ICD-10-CM | POA: Diagnosis not present

## 2023-11-29 DIAGNOSIS — T83518A Infection and inflammatory reaction due to other urinary catheter, initial encounter: Principal | ICD-10-CM | POA: Diagnosis present

## 2023-11-29 DIAGNOSIS — I2510 Atherosclerotic heart disease of native coronary artery without angina pectoris: Secondary | ICD-10-CM | POA: Diagnosis present

## 2023-11-29 DIAGNOSIS — E7800 Pure hypercholesterolemia, unspecified: Secondary | ICD-10-CM | POA: Diagnosis present

## 2023-11-29 DIAGNOSIS — N3000 Acute cystitis without hematuria: Secondary | ICD-10-CM | POA: Diagnosis not present

## 2023-11-29 DIAGNOSIS — I110 Hypertensive heart disease with heart failure: Secondary | ICD-10-CM | POA: Diagnosis present

## 2023-11-29 DIAGNOSIS — N390 Urinary tract infection, site not specified: Secondary | ICD-10-CM | POA: Diagnosis present

## 2023-11-29 DIAGNOSIS — Z885 Allergy status to narcotic agent status: Secondary | ICD-10-CM

## 2023-11-29 DIAGNOSIS — G43909 Migraine, unspecified, not intractable, without status migrainosus: Secondary | ICD-10-CM | POA: Diagnosis present

## 2023-11-29 DIAGNOSIS — K219 Gastro-esophageal reflux disease without esophagitis: Secondary | ICD-10-CM | POA: Diagnosis present

## 2023-11-29 DIAGNOSIS — Z7984 Long term (current) use of oral hypoglycemic drugs: Secondary | ICD-10-CM

## 2023-11-29 DIAGNOSIS — G4730 Sleep apnea, unspecified: Secondary | ICD-10-CM | POA: Diagnosis present

## 2023-11-29 DIAGNOSIS — Z888 Allergy status to other drugs, medicaments and biological substances status: Secondary | ICD-10-CM

## 2023-11-29 DIAGNOSIS — E119 Type 2 diabetes mellitus without complications: Secondary | ICD-10-CM | POA: Diagnosis not present

## 2023-11-29 DIAGNOSIS — Z87442 Personal history of urinary calculi: Secondary | ICD-10-CM

## 2023-11-29 DIAGNOSIS — E785 Hyperlipidemia, unspecified: Secondary | ICD-10-CM | POA: Diagnosis present

## 2023-11-29 DIAGNOSIS — I5032 Chronic diastolic (congestive) heart failure: Secondary | ICD-10-CM | POA: Diagnosis present

## 2023-11-29 DIAGNOSIS — J449 Chronic obstructive pulmonary disease, unspecified: Secondary | ICD-10-CM | POA: Diagnosis present

## 2023-11-29 DIAGNOSIS — M51369 Other intervertebral disc degeneration, lumbar region without mention of lumbar back pain or lower extremity pain: Secondary | ICD-10-CM | POA: Diagnosis present

## 2023-11-29 DIAGNOSIS — D61818 Other pancytopenia: Secondary | ICD-10-CM | POA: Diagnosis present

## 2023-11-29 DIAGNOSIS — N179 Acute kidney failure, unspecified: Secondary | ICD-10-CM | POA: Diagnosis present

## 2023-11-29 DIAGNOSIS — K7460 Unspecified cirrhosis of liver: Secondary | ICD-10-CM | POA: Diagnosis present

## 2023-11-29 DIAGNOSIS — R001 Bradycardia, unspecified: Secondary | ICD-10-CM | POA: Diagnosis not present

## 2023-11-29 DIAGNOSIS — Z8616 Personal history of COVID-19: Secondary | ICD-10-CM | POA: Diagnosis not present

## 2023-11-29 DIAGNOSIS — F4310 Post-traumatic stress disorder, unspecified: Secondary | ICD-10-CM | POA: Diagnosis present

## 2023-11-29 DIAGNOSIS — R54 Age-related physical debility: Secondary | ICD-10-CM | POA: Diagnosis present

## 2023-11-29 DIAGNOSIS — Z515 Encounter for palliative care: Secondary | ICD-10-CM | POA: Diagnosis not present

## 2023-11-29 DIAGNOSIS — Z91030 Bee allergy status: Secondary | ICD-10-CM

## 2023-11-29 DIAGNOSIS — K7682 Hepatic encephalopathy: Secondary | ICD-10-CM | POA: Diagnosis not present

## 2023-11-29 DIAGNOSIS — G8929 Other chronic pain: Secondary | ICD-10-CM | POA: Diagnosis present

## 2023-11-29 DIAGNOSIS — Z8673 Personal history of transient ischemic attack (TIA), and cerebral infarction without residual deficits: Secondary | ICD-10-CM

## 2023-11-29 DIAGNOSIS — Z79899 Other long term (current) drug therapy: Secondary | ICD-10-CM | POA: Diagnosis not present

## 2023-11-29 DIAGNOSIS — Z6828 Body mass index (BMI) 28.0-28.9, adult: Secondary | ICD-10-CM

## 2023-11-29 DIAGNOSIS — R296 Repeated falls: Secondary | ICD-10-CM | POA: Diagnosis present

## 2023-11-29 LAB — CBC
HCT: 34.8 % — ABNORMAL LOW (ref 39.0–52.0)
Hemoglobin: 12 g/dL — ABNORMAL LOW (ref 13.0–17.0)
MCH: 32.2 pg (ref 26.0–34.0)
MCHC: 34.5 g/dL (ref 30.0–36.0)
MCV: 93.3 fL (ref 80.0–100.0)
Platelets: 84 10*3/uL — ABNORMAL LOW (ref 150–400)
RBC: 3.73 MIL/uL — ABNORMAL LOW (ref 4.22–5.81)
RDW: 16.3 % — ABNORMAL HIGH (ref 11.5–15.5)
WBC: 3.5 10*3/uL — ABNORMAL LOW (ref 4.0–10.5)
nRBC: 0 % (ref 0.0–0.2)

## 2023-11-29 LAB — COMPREHENSIVE METABOLIC PANEL WITH GFR
ALT: 30 U/L (ref 0–44)
AST: 40 U/L (ref 15–41)
Albumin: 2.5 g/dL — ABNORMAL LOW (ref 3.5–5.0)
Alkaline Phosphatase: 59 U/L (ref 38–126)
Anion gap: 7 (ref 5–15)
BUN: 30 mg/dL — ABNORMAL HIGH (ref 8–23)
CO2: 23 mmol/L (ref 22–32)
Calcium: 9.9 mg/dL (ref 8.9–10.3)
Chloride: 110 mmol/L (ref 98–111)
Creatinine, Ser: 1.55 mg/dL — ABNORMAL HIGH (ref 0.61–1.24)
GFR, Estimated: 47 mL/min — ABNORMAL LOW (ref >60)
Glucose, Bld: 114 mg/dL — ABNORMAL HIGH (ref 70–99)
Potassium: 3.8 mmol/L (ref 3.5–5.1)
Sodium: 140 mmol/L (ref 135–145)
Total Bilirubin: 1.6 mg/dL — ABNORMAL HIGH (ref 0.0–1.2)
Total Protein: 6.2 g/dL — ABNORMAL LOW (ref 6.5–8.1)

## 2023-11-29 LAB — URINALYSIS, W/ REFLEX TO CULTURE (INFECTION SUSPECTED)
Bilirubin Urine: NEGATIVE
Glucose, UA: NEGATIVE mg/dL
Hgb urine dipstick: NEGATIVE
Ketones, ur: NEGATIVE mg/dL
Nitrite: NEGATIVE
Protein, ur: NEGATIVE mg/dL
Specific Gravity, Urine: 1.013 (ref 1.005–1.030)
Squamous Epithelial / HPF: 0 /HPF (ref 0–5)
pH: 5 (ref 5.0–8.0)

## 2023-11-29 LAB — RESP PANEL BY RT-PCR (RSV, FLU A&B, COVID)  RVPGX2
Influenza A by PCR: NEGATIVE
Influenza B by PCR: NEGATIVE
Resp Syncytial Virus by PCR: NEGATIVE
SARS Coronavirus 2 by RT PCR: NEGATIVE

## 2023-11-29 LAB — TROPONIN I (HIGH SENSITIVITY)
Troponin I (High Sensitivity): 14 ng/L (ref <18)
Troponin I (High Sensitivity): 15 ng/L (ref <18)

## 2023-11-29 LAB — GLUCOSE, CAPILLARY: Glucose-Capillary: 92 mg/dL (ref 70–99)

## 2023-11-29 LAB — ETHANOL: Alcohol, Ethyl (B): <10 mg/dL (ref <10)

## 2023-11-29 LAB — AMMONIA: Ammonia: 174 umol/L — ABNORMAL HIGH (ref 9–35)

## 2023-11-29 LAB — CK: Total CK: 60 U/L (ref 49–397)

## 2023-11-29 MED ORDER — HEPARIN SODIUM (PORCINE) 5000 UNIT/ML IJ SOLN
5000.0000 [IU] | Freq: Three times a day (TID) | INTRAMUSCULAR | Status: DC
  Administered 2023-11-29 – 2023-12-05 (×18): 5000 [IU] via SUBCUTANEOUS
  Filled 2023-11-29 (×18): qty 1

## 2023-11-29 MED ORDER — PANTOPRAZOLE SODIUM 40 MG PO TBEC
40.0000 mg | DELAYED_RELEASE_TABLET | Freq: Every day | ORAL | Status: DC
  Administered 2023-11-30 – 2023-12-05 (×6): 40 mg via ORAL
  Filled 2023-11-29 (×6): qty 1

## 2023-11-29 MED ORDER — LACTULOSE 10 GM/15ML PO SOLN
30.0000 g | Freq: Three times a day (TID) | ORAL | Status: DC
  Administered 2023-11-29 – 2023-12-05 (×15): 30 g via ORAL
  Filled 2023-11-29 (×15): qty 60

## 2023-11-29 MED ORDER — LOSARTAN POTASSIUM 50 MG PO TABS
50.0000 mg | ORAL_TABLET | Freq: Every day | ORAL | Status: DC
  Administered 2023-11-30 – 2023-12-04 (×5): 50 mg via ORAL
  Filled 2023-11-29 (×5): qty 1

## 2023-11-29 MED ORDER — ONDANSETRON HCL 4 MG/2ML IJ SOLN: 4.0000 mg | Freq: Four times a day (QID) | INTRAMUSCULAR | Status: AC | PRN

## 2023-11-29 MED ORDER — INSULIN ASPART 100 UNIT/ML IJ SOLN: 0.0000 [IU] | Freq: Every day | INTRAMUSCULAR | Status: DC

## 2023-11-29 MED ORDER — NITROGLYCERIN 0.4 MG/SPRAY TL SOLN: 1.0000 | Status: DC | PRN

## 2023-11-29 MED ORDER — RIFAXIMIN 550 MG PO TABS
550.0000 mg | ORAL_TABLET | Freq: Two times a day (BID) | ORAL | Status: DC
  Administered 2023-11-29 – 2023-12-05 (×12): 550 mg via ORAL
  Filled 2023-11-29 (×13): qty 1

## 2023-11-29 MED ORDER — ALBUTEROL SULFATE (2.5 MG/3ML) 0.083% IN NEBU: 2.5000 mg | INHALATION_SOLUTION | RESPIRATORY_TRACT | Status: DC | PRN

## 2023-11-29 MED ORDER — INSULIN ASPART 100 UNIT/ML IJ SOLN: 0.0000 [IU] | Freq: Three times a day (TID) | INTRAMUSCULAR | Status: DC

## 2023-11-29 MED ORDER — LACTATED RINGERS IV BOLUS
1000.0000 mL | Freq: Once | INTRAVENOUS | Status: AC
Start: 2023-11-29 — End: 2023-11-29
  Administered 2023-11-29: 1000 mL via INTRAVENOUS

## 2023-11-29 MED ORDER — FUROSEMIDE 20 MG PO TABS
20.0000 mg | ORAL_TABLET | Freq: Every day | ORAL | Status: DC
  Administered 2023-11-30 – 2023-12-05 (×6): 20 mg via ORAL
  Filled 2023-11-29 (×6): qty 1

## 2023-11-29 MED ORDER — ATORVASTATIN CALCIUM 20 MG PO TABS
40.0000 mg | ORAL_TABLET | Freq: Every day | ORAL | Status: DC
  Administered 2023-11-30 – 2023-12-04 (×5): 40 mg via ORAL
  Filled 2023-11-29 (×5): qty 2

## 2023-11-29 MED ORDER — SENNA 8.6 MG PO TABS: 1.0000 | ORAL_TABLET | Freq: Two times a day (BID) | ORAL | Status: DC | PRN

## 2023-11-29 MED ORDER — LACTATED RINGERS IV SOLN: INTRAVENOUS | Status: AC

## 2023-11-29 MED ORDER — LACTULOSE 10 GM/15ML PO SOLN
30.0000 g | Freq: Once | ORAL | Status: AC
  Administered 2023-11-29: 30 g via ORAL
  Filled 2023-11-29: qty 60

## 2023-11-29 MED ORDER — ASPIRIN 81 MG PO CHEW
81.0000 mg | CHEWABLE_TABLET | Freq: Every day | ORAL | Status: DC
  Administered 2023-11-30 – 2023-12-05 (×6): 81 mg via ORAL
  Filled 2023-11-29 (×6): qty 1

## 2023-11-29 MED ORDER — SPIRONOLACTONE 25 MG PO TABS
25.0000 mg | ORAL_TABLET | Freq: Every day | ORAL | Status: DC
  Administered 2023-11-30 – 2023-12-05 (×6): 25 mg via ORAL
  Filled 2023-11-29 (×6): qty 1

## 2023-11-29 MED ORDER — ONDANSETRON HCL 4 MG PO TABS: 4.0000 mg | ORAL_TABLET | Freq: Four times a day (QID) | ORAL | Status: AC | PRN

## 2023-11-29 MED ORDER — ACETAMINOPHEN 650 MG RE SUPP: 650.0000 mg | Freq: Four times a day (QID) | RECTAL | Status: AC | PRN

## 2023-11-29 MED ORDER — ADULT MULTIVITAMIN W/MINERALS CH
1.0000 | ORAL_TABLET | Freq: Every day | ORAL | Status: DC
  Administered 2023-11-30 – 2023-12-05 (×6): 1 via ORAL
  Filled 2023-11-29 (×6): qty 1

## 2023-11-29 MED ORDER — NITROGLYCERIN 0.4 MG SL SUBL: 0.4000 mg | SUBLINGUAL_TABLET | SUBLINGUAL | Status: DC | PRN

## 2023-11-29 MED ORDER — ACETAMINOPHEN 325 MG PO TABS
650.0000 mg | ORAL_TABLET | Freq: Four times a day (QID) | ORAL | Status: AC | PRN
  Filled 2023-11-29: qty 2

## 2023-11-29 NOTE — TOC Initial Note (Signed)
 Transition of Care [facility]) - Initial/Assessment Note    Patient Details  Name: [name] MRN: [id number] Date of Birth: May 21, 1951  [facility] [address] CM/SW Contact:    Colin Broach, LCSW Phone Number: 11/29/2023, 5:03 PM  Clinical Narrative:  Per chart review, patient recently discharged from [facility] on 11/23/2023.  Patient is a resident at [facility] with Home Health PT/OT via [facility].  Doristine Mango [phone] at the group home has spoken with the patient about needing guardianship.   Doristine Mango will transport the patient when he is medically clear for dc.  TOC will continue to follow                        Patient Goals and CMS Choice            Expected Discharge Plan and Services                                              Prior Living Arrangements/Services                       Activities of Daily Living      Permission Sought/Granted                  Emotional Assessment              Admission diagnosis:  Hepatic encephalopathy (HCC) [K76.82] Patient Active Problem List   Diagnosis Date Noted   Altered mental status 11/12/2023   Type II diabetes mellitus with renal manifestations (HCC) 10/21/2023   Chronic kidney disease, stage 3a (HCC) 10/21/2023   Myocardial injury 10/21/2023   CKD stage 3a, GFR 45-59 ml/min (HCC) - baseline scr 1.3 10/01/2023   Hepatic encephalopathy (HCC) 09/30/2023   Liver cirrhosis (HCC) 09/30/2023   Essential hypertension 09/30/2023   Diabetes mellitus type 2, noninsulin dependent (HCC) 09/30/2023   CAD (coronary artery disease) 09/30/2023   Hyperlipidemia 09/30/2023   Hepatic encephalopathy (HCC) 08/01/2023   Hyperammonemia (HCC) 07/30/2023   Cervical spinal stenosis 07/01/2023   Cervical myelopathy (HCC) 06/21/2023   Cervical stenosis of spine 06/21/2023   Cervical radiculopathy 06/20/2023   E coli bacteremia 06/17/2023   Left leg cellulitis 03/23/2023   Leukocytosis 03/23/2023    Serum total bilirubin elevated 03/23/2023   Left leg pain 03/23/2023   Suicidal ideation 01/14/2023   Acute pain 01/14/2023   Adrenal insufficiency (HCC) 12/26/2022   Orthostatic hypotension 12/24/2022   Rib pain on right side 12/24/2022   Hepatitis B infection 12/24/2022   Myocardial ischemia 12/24/2022   Acute appendicitis 11/05/2022   Pancytopenia (HCC) 11/05/2022   Sinus bradycardia 11/05/2022   Acute metabolic encephalopathy 09/22/2022   Liver lesion 09/22/2022   Hyperlipidemia, unspecified 09/22/2022   Type 2 diabetes mellitus with hyperlipidemia (HCC) 09/22/2022   Obesity (BMI 30-39.9) 09/22/2022   Acute hepatic encephalopathy (HCC) 09/22/2022   MDD (major depressive disorder), recurrent severe, 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   Major depressive disorder, 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 psychotic features (HCC) 09/15/2022   Protein-calorie malnutrition, severe (HCC) 08/31/2022   Hypotension 08/31/2022   Weakness    Hx of transient ischemic attack (TIA) 04/28/2022   Obesity    Chronic diastolic CHF (congestive heart failure) (HCC)    Depression    Sepsis (HCC) 03/28/2022   Bacteremia due to Klebsiella pneumoniae 03/28/2022  Hypoglycemia 03/28/2022   Syncope 03/24/2022   Hypokalemia 03/24/2022   Fever 03/24/2022   COPD (chronic obstructive pulmonary disease) (HCC)    Elevated troponin    Abnormal LFTs    History of hepatitis C    AKI (acute kidney injury) (HCC)    Pure hypercholesterolemia    Obesity, Class III, BMI 40-49.9 (morbid obesity) (HCC) 04/18/2020   Thrombocytopenia (HCC) 04/17/2020   Leukopenia 04/17/2020   HTN (hypertension) 04/17/2018   Uncontrolled type 2 diabetes mellitus with hyperglycemia, without long-term current use of insulin (HCC) 04/17/2018   Lymphedema 04/17/2018   Normocytic anemia 04/07/2018   Chest pain 03/20/2018   Unsheltered homelessness 12/16/2017   PCP:  [facility] [facility] [employer] Pharmacy:   [id number] [address]  ([address]), [place] - [address] [place]) [place] [address] Phone: [phone] Fax: [phone]  [facility] [address] [place] [address] Phone: [phone] Fax: [phone]  [facility] - [place], [place] - [place] [address] [place] [address] Phone: [phone] Fax: [phone]     Social Drivers of Health (SDOH) Social History: SDOH Screenings   Food Insecurity: Unknown (11/21/2023)  Housing: Unknown (11/21/2023)  Transportation Needs: No Transportation Needs (11/21/2023)  Utilities: Not At Risk (11/21/2023)  Alcohol Screen: Low Risk  (09/24/2022)  Depression (PHQ2-9): High Risk (04/26/2022)  Financial Resource Strain: High Risk (04/27/2022)  Physical Activity: Sufficiently Active (04/16/2018)  Social Connections: Socially Isolated (11/21/2023)  Stress: Stress Concern Present (04/16/2018)  Tobacco Use: High Risk (11/21/2023)   SDOH Interventions:     Readmission Risk Interventions    11/29/2023    5:03 PM 11/13/2023    2:21 PM 11/12/2023    4:00 PM  Readmission Risk Prevention Plan  Transportation Screening Not Complete  Not Complete  Medication Review [employer]) Not Complete Complete Not Complete  PCP or Specialist appointment within 3-5 days of discharge Not Complete Complete Complete  HRI or Home Care Consult Not Complete Complete   SW Recovery Care/Counseling Consult Not Complete Complete   Palliative Care Screening Not Applicable Complete   Skilled Nursing Facility Not Applicable Not Applicable

## 2023-11-29 NOTE — Assessment & Plan Note (Signed)
 Home aspirin and atorvastatin resumed on admission

## 2023-11-29 NOTE — Assessment & Plan Note (Addendum)
 CT head without any acute abnormality and significantly elevated ammonia which is consistent with hepatic encephalopathy. Home lactulose resumed on admission Added lactulose enema as there was no bowel movement with p.o. lactulose Rifaximin 550 mg p.o. recent on admission Continue with LR infusion at 125 mL/h, 1 day ordered Started on diet and he will complete IV fluid today Aspiration, fall precaution

## 2023-11-29 NOTE — ED Provider Notes (Signed)
 [facility] Provider Note    Event Date/Time   First MD Initiated Contact with Patient 11/29/23 1231     (approximate)   History   Altered Mental Status   HPI  [name] is a 73 y.o. male presents to the emergency department from adult living facility who presents to the emergency department with altered mental status.  Patient has a history of depression, hypertension, hyperlipidemia, diabetes, COPD, HFpEF, hepatitis C, liver cirrhosis, liver lesion with suspected cancer, chronic thrombocytopenia, presents to the emergency department with altered mental status.  Unable to get any history from the patient.  EMS stated that the patient had worsening altered mental status and was staring off into space.  On chart review patient had a recent hospitalization for hepatic encephalopathy and had an elevated ammonia level at that time up to 133.  Patient was also bradycardic at that time.  Patient was ultimately discharged home once his hepatic encephalopathy resolved back to his group home.     Physical Exam   Triage Vital Signs: ED Triage Vitals [11/29/23 1237]  Encounter Vitals Group     BP (!) 152/75     Systolic BP Percentile      Diastolic BP Percentile      Pulse Rate (!) 46     Resp 16     Temp 98.1 F (36.7 C)     Temp Source Oral     SpO2 100 %     Weight 179 lb 7.3 oz (81.4 kg)     Height 5\' 6"  (1.676 m)     Head Circumference      Peak Flow      Pain Score 0     Pain Loc      Pain Education      Exclude from Growth Chart     Most recent vital signs: Vitals:   11/29/23 1237  BP: (!) 152/75  Pulse: (!) 46  Resp: 16  Temp: 98.1 F (36.7 C)  SpO2: 100%    Physical Exam Constitutional:      Appearance: He is well-developed. He is ill-appearing.     Comments: Responds to his name.  Laying in bed with his mouth open.  HENT:     Head: Atraumatic.     Mouth/Throat:     Mouth: Mucous membranes are dry.  Eyes:     Extraocular  Movements: Extraocular movements intact.     Conjunctiva/sclera: Conjunctivae normal.     Pupils: Pupils are equal, round, and reactive to light.  Cardiovascular:     Rate and Rhythm: Regular rhythm. Bradycardia present.     Heart sounds: No murmur heard. Pulmonary:     Effort: No respiratory distress.     Breath sounds: No wheezing.  Abdominal:     Tenderness: There is no abdominal tenderness. There is no guarding.  Musculoskeletal:        General: Normal range of motion.     Cervical back: Normal range of motion and neck supple.     Right lower leg: No edema.     Left lower leg: No edema.  Skin:    General: Skin is warm.     Capillary Refill: Capillary refill takes less than 2 seconds.  Neurological:     General: No focal deficit present.     Mental Status: He is alert. He is disoriented.     IMPRESSION / MDM / ASSESSMENT AND PLAN / ED COURSE  I reviewed the  triage vital signs and the nursing notes.  On arrival patient was afebrile, rectal temp was also normal.  Bradycardic with a heart rate of 46, normotensive and 100% on room air.  Plan for CT scan of the head for uncertain altered mental status in the setting of thrombocytopenia.  Will do a broad workup including evaluation for hepatic encephalopathy given his recent hospitalization and for an infectious source.  Does appear clinically dry, will give 1 L of IV fluids.  EKG  I, Corena Herter, the attending physician, personally viewed and interpreted this ECG.  Sinus bradycardia with a heart rate of 48.  P wave present.  Appears to have a normal PR interval.  Normal QTc.  QRS mildly wide at 123. Sinus bradycardia with a heart rate as low as 42.  Difficult to discern P waves on prior EKGs as well.  Sinus bradycardia while on cardiac telemetry.  RADIOLOGY I independently reviewed imaging, my interpretation of imaging: No findings of intracranial hemorrhage.  Read as no acute findings.  LABS (all labs ordered are listed,  but only abnormal results are displayed) Labs interpreted as -    Labs Reviewed  COMPREHENSIVE METABOLIC PANEL WITH GFR - Abnormal; Notable for the following components:      Result Value   Glucose, Bld 114 (*)    BUN 30 (*)    Creatinine, Ser 1.55 (*)    Total Protein 6.2 (*)    Albumin 2.5 (*)    Total Bilirubin 1.6 (*)    GFR, Estimated 47 (*)    All other components within normal limits  CBC - Abnormal; Notable for the following components:   WBC 3.5 (*)    RBC 3.73 (*)    Hemoglobin 12.0 (*)    HCT 34.8 (*)    RDW 16.3 (*)    Platelets 84 (*)    All other components within normal limits  AMMONIA - Abnormal; Notable for the following components:   Ammonia 174 (*)    All other components within normal limits  RESP PANEL BY RT-PCR (RSV, FLU A&B, COVID)  RVPGX2  ETHANOL  URINALYSIS, W/ REFLEX TO CULTURE (INFECTION SUSPECTED)  CK  CBG MONITORING, ED  TROPONIN I (HIGH SENSITIVITY)  TROPONIN I (HIGH SENSITIVITY)     MDM    Patient found to have acute kidney injury with creatinine elevated up to 1.5 from a baseline of 1.  Does have an elevated BUN.  Given IV fluids.  CK added on.  Chronic thrombocytopenia, anemia and leukopenia.  No obvious signs or symptoms of a GI bleed.  Ammonia significantly elevated at 174.  Given IV fluids and lactulose.  Consulted hospitalist for admission for hepatic encephalopathy.   PROCEDURES:  Critical Care performed: yes  .Critical Care  Performed by: Corena Herter, MD Authorized by: Corena Herter, MD   Critical care provider statement:    Critical care time (minutes):  30   Critical care time was exclusive of:  Separately billable procedures and treating other patients   Critical care was necessary to treat or prevent imminent or life-threatening deterioration of the following conditions:  Metabolic crisis   Critical care was time spent personally by me on the following activities:  Development of treatment plan with patient or  surrogate, discussions with consultants, evaluation of patient's response to treatment, examination of patient, ordering and review of laboratory studies, ordering and review of radiographic studies, ordering and performing treatments and interventions, pulse oximetry, re-evaluation of patient's condition and review of  old charts   Patient's presentation is most consistent with acute presentation with potential threat to life or bodily function.   MEDICATIONS ORDERED IN ED: Medications  lactulose (CHRONULAC) 10 GM/15ML solution 30 g (has no administration in time range)  lactated ringers bolus 1,000 mL (has no administration in time range)    FINAL CLINICAL IMPRESSION(S) / ED DIAGNOSES   Final diagnoses:  Altered mental status, unspecified altered mental status type  Hepatic encephalopathy (HCC)     Rx / DC Orders   ED Discharge Orders     None        Note:  This document was prepared using Dragon voice recognition software and may include unintentional dictation errors.   Corena Herter, MD 11/29/23 [phone]

## 2023-11-29 NOTE — Assessment & Plan Note (Addendum)
 Home atorvastatin 40 mg daily resume for 4/5

## 2023-11-29 NOTE — Assessment & Plan Note (Signed)
 Home losartan 50 mg daily, spironolactone 25 mg daily, furosemide 20 mg daily were resumed on admission

## 2023-11-29 NOTE — H&P (Signed)
 History and Physical   [name]:[id number] DOB: 1950/09/06 DOA: 11/29/2023  PCP: [facility]  Patient coming from: Care home via EMS  I have personally briefly reviewed patient's old medical records in [facility] EMR.  Chief Concern: Altered mentation  HPI: [name] is a 73 year old male with history of hepatic encephalopathy, liver cirrhosis, history of hepatitis C, hypertension, COPD, hyperlipidemia, non-insulin-dependent diabetes mellitus, depression, who presents from care home for chief concerns of altered mentation.  Vitals in the ED showed T of 98.1, rr 16, hr 48, blood pressure 152/75, SpO2 100% on room air.  Serum sodium is 140, potassium 3.8, chloride 110, bicarb 23, BUN 30, serum creatinine 1.55, EGFR 1.08, nonfasting blood glucose 114, WBC 3.4, hemoglobin 12.0, platelets of 84.  Ammonia level was elevated at 174.  CT head wo contrast: Was read as no acute intracranial abnormality.  ED treatment: Lactulose 30 g p.o. one-time dose, LR 1 L bolus. -------------------------------- At bed side, patient staring at the ceiling.  He will look at me and then look to the ceiling again.  He responds to his name.  He states that his name is [name] and that he is in the hospital.  He was not able to tell me his age or the current calendar year.  He states he does not know why he is in the hospital again.  When I ask him if he can swallow his medications, he states he thinks he can.  Social history: Patient is from care home.  ROS: To complete due to acuity of patient presentation  ED Course: Discussed with EDP, patient requiring hospitalization for chief concerns of altered mental status, suspect hepatic encephalopathy.  Assessment/Plan  Principal Problem:   Hepatic encephalopathy (HCC) Active Problems:   Diabetes mellitus type 2, noninsulin dependent (HCC)   Hx of transient ischemic attack (TIA)   Sinus bradycardia   Pancytopenia (HCC)    Hyperlipidemia   MDD (major depressive disorder), recurrent severe, without psychosis (HCC)   HTN (hypertension)   Thrombocytopenia (HCC)   Uncontrolled type 2 diabetes mellitus with hyperglycemia, without long-term current use of insulin (HCC)   History of hepatitis C   Pure hypercholesterolemia   Weakness   Assessment and Plan:  * Hepatic encephalopathy (HCC) Portable chest x-ray ordered on admission Lactulose 30 mg p.o. one-time dose per EP Home lactulose resumed on admission Rifaximin 550 mg p.o. recent on admission Status post LR 1 L bolus per EDP Continue with LR infusion at 125 mL/h, 1 day ordered Aspiration, fall precaution  Diabetes mellitus type 2, noninsulin dependent (HCC) Insulin SSI with at bedtime coverage, very sensitive dosing ordered  Hx of transient ischemic attack (TIA) Home aspirin and atorvastatin resumed on admission  Hyperlipidemia Home atorvastatin 40 mg daily resume for 4/5  HTN (hypertension) Home losartan 50 mg daily, spironolactone 25 mg daily, furosemide 20 mg daily were resumed on admission  Thrombocytopenia (HCC) At baseline  Chart reviewed.   DVT prophylaxis: Heparin 5000 units subcutaneous every 8 hours Code Status: DNR/DNI, MOST form reviewed Diet: N.p.o. Family Communication: No Disposition Plan: Pending clinical course; guarded prognosis Consults called: None at this time Admission status: Telemetry medical, inpatient  Past Medical History:  Diagnosis Date   (HFpEF) heart failure with preserved ejection fraction (HCC)    a.) TTE 03/20/2018: EF 60-65%, no RWMAs, mild LAE, mild-mod MR, PASP 42; b.) TTE 03/31/2021: EF 55-60%, no RWMAs, mild LVH, mild LAE, AoV sclerosis without stenosis; c.) TTE 03/25/2022: EF  60-65%, no RWMAs, AoV sclerosis without stenosis; d.) TTE 12/24/2022: EF 60-65%, no RWMAs, mild LVH, mild LAE.   Anasarca    Anemia    Anxiety    Aortic atherosclerosis (HCC)    Arthritis    Asthma    Bacteremia due to  Escherichia coli 05/2023   Bacteremia due to Klebsiella pneumoniae 2023   Bilateral carotid artery disease (HCC)    Bradycardia    CAD (coronary artery disease) 10/21/2006   a.) MV 10/21/2006: small reversible defect involving the apical  lateral wall c/w possible ischemia; b.) MV: 03/24/2018: no ischemia; c.) MV 10/04/2021: LAD calcifications   CAD (coronary artery disease)    Cervical radiculopathy    Chest pain    a.) MV 03/21/2018: no ischemia; b.) MV 10/04/2021: no ischemia   Chronic back pain    Chronic common bile duct dilatation    Cirrhosis (HCC)    a.) (+) hepatic lesion on CT; likely hepatocellular carcinoma; b.) CT evidence of associated portal hypertension; c.) (+) abdominopelvic anasarca   COPD (chronic obstructive pulmonary disease) (HCC)    DDD (degenerative disc disease), lumbar    Depression    Frequent falls    GERD (gastroesophageal reflux disease)    HBV (hepatitis B virus) infection    HCV (hepatitis C virus)    Hernia of anterior abdominal wall    History of 2019 novel coronavirus disease (COVID-19) 04/16/2020   Homelessness    a.) limited on placement as patient is listed on [place] sex offender listing for exploitation of a minor (possession of child pornography); incarcerated 12 years (released 11/2017)   Hyperlipidemia    Hypertension    Infestation by bed bug 02/2022   Malingering    Migraine    Nephrolithiasis    Obesity    Portal hypertension (HCC)    PTSD (post-traumatic stress disorder)    a.) brief training in the [employer] at age 49; someone was killed in front of him   Pulmonary HTN (HCC)    a.) multiple CT scans desmontrating dilated PA c/w pHTN; b.) TTE 03/20/2018: PASP 42 mmHg   Sleep apnea    a.) no nocturnal PAP therapy   Sleep apnea    Stenosis of cervical spine with myelopathy (HCC)    Suicidal ideations    T2DM (type 2 diabetes mellitus) (HCC)    Thrombocytopenia (HCC)    a.) related to hepatic cirrhosis; spleen normal in size   TIA  (transient ischemic attack)    TIA (transient ischemic attack) 09/30/2023   Past Surgical History:  Procedure Laterality Date   ANTERIOR CERVICAL DECOMP/DISCECTOMY FUSION N/A 07/01/2023   Procedure: ANTERIOR CERVICAL DISCECTOMY AND FUSION (FORGE);  Surgeon: Venetia Night, MD;  Location: [facility] ORS;  Service: Neurosurgery;  Laterality: N/A;  Keep as last case of the day per Dr Myer Haff   ANTERIOR CERVICAL DISCECTOMY     APPENDECTOMY     CARDIAC CATHETERIZATION     CHOLECYSTECTOMY     EYE SURGERY     INNER EAR SURGERY     NOSE SURGERY     XI ROBOTIC LAPAROSCOPIC ASSISTED APPENDECTOMY N/A 11/05/2022   Procedure: XI ROBOTIC LAPAROSCOPIC ASSISTED APPENDECTOMY;  Surgeon: Carolan Shiver, MD;  Location: [facility] ORS;  Service: General;  Laterality: N/A;   Social History:  reports that he has been smoking cigarettes. He has never used smokeless tobacco. He reports that he does not currently use alcohol. He reports that he does not currently use drugs.  Allergies  Allergen Reactions  Bee Venom Anaphylaxis   Codeine Itching    Only itching. Recently allergy tested at [facility]   Rsv Mrna Pre-F Virus Vaccine Swelling   Meloxicam     Other Reaction(s): ?overdose of local given at dentist--had trouble breathing   Codeine    Novocain [Procaine]    Sulfa Antibiotics Other (See Comments)   Sulfa Antibiotics     hallucinations   Family History  Problem Relation Age of Onset   Hypertension Mother    Heart disease Mother    Heart failure Maternal Grandmother    Family history: Family history reviewed and not pertinent.  Prior to Admission medications   Medication Sig Start Date End Date Taking? Authorizing Provider  acetaminophen (TYLENOL) 650 MG CR tablet Take 650 mg by mouth every 8 (eight) hours as needed for pain.    [provider]  albuterol (PROVENTIL) (2.5 MG/3ML) 0.083% nebulizer solution Take 2.5 mg by nebulization every 4 (four) hours as needed for wheezing or shortness  of breath. 07/16/23   [provider]  aspirin 81 MG chewable tablet Chew 81 mg by mouth daily.    [provider]  atorvastatin (LIPITOR) 40 MG tablet Take 40 mg by mouth daily.    [provider]  citalopram (CELEXA) 20 MG tablet Take 20 mg by mouth daily.    [provider]  feeding supplement (ENSURE ENLIVE / ENSURE PLUS) LIQD Take 237 mLs by mouth 3 (three) times daily between meals. 10/25/23   Jonah Blue, MD  furosemide (LASIX) 20 MG tablet Take 1 tablet (20 mg total) by mouth daily. 11/07/23   Agbata, Elwyn Lade, MD  gabapentin (NEURONTIN) 300 MG capsule Take 300 mg by mouth 2 (two) times daily. 06/07/23   [provider]  lactulose (CHRONULAC) 10 GM/15ML solution Take 45 mLs (30 g total) by mouth 3 (three) times daily. 08/05/23   Lanae Boast, MD  losartan (COZAAR) 50 MG tablet Take 50 mg by mouth daily. 11/19/23   [provider]  Multiple Vitamin (MULTIVITAMIN WITH MINERALS) TABS tablet Take 1 tablet by mouth daily.    [provider]  nitroGLYCERIN (NITROLINGUAL) 0.4 MG/SPRAY spray Place 1 spray under the tongue every 5 (five) minutes x 3 doses as needed for chest pain. 10/30/23   Delma Freeze, FNP  ondansetron (ZOFRAN) 4 MG tablet Take 1 tablet (4 mg total) by mouth every 6 (six) hours as needed for nausea. 06/21/23   Alford Highland, MD  pantoprazole (PROTONIX) 40 MG tablet Take 40 mg by mouth daily. 09/24/23   [provider]  phenol (CHLORASEPTIC) 1.4 % LIQD Use as directed 1 spray in the mouth or throat as needed for throat irritation / pain.    [provider]  QUEtiapine (SEROQUEL) 50 MG tablet Take 50 mg by mouth at bedtime. 11/19/23   [provider]  senna (SENOKOT) 8.6 MG TABS tablet Take 1 tablet (8.6 mg total) by mouth 2 (two) times daily as needed for mild constipation. 07/02/23   Susanne Borders, PA  spironolactone (ALDACTONE) 25 MG tablet Take 25 mg by mouth daily. 09/24/23   [provider]  VENTOLIN HFA 108 (90 Base) MCG/ACT inhaler Inhale 2 puffs into the lungs every 4 (four) hours as needed for wheezing. 07/16/23   [provider]  XIFAXAN 550 MG TABS tablet Take 550 mg by mouth 2 (two) times daily. 09/24/23   [provider]   Physical Exam: Vitals:   11/29/23 1237 11/29/23 1630  BP: Marland Kitchen)  152/75 (!) 162/76  Pulse: (!) 46 (!) 47  Resp: 16 11  Temp: 98.1 F (36.7 C)   TempSrc: Oral   SpO2: 100% 100%  Weight: 81.4 kg   Height: 5\' 6"  (1.676 m)    Constitutional: appears frail, cachectic appearing, altered Eyes: PERRL, lids and conjunctivae normal ENMT: Mucous membranes are dry. Posterior pharynx clear of any exudate or lesions.  Teeth loss.  Unable to assess hearing  Neck: normal, supple, no masses, no thyromegaly Respiratory: clear to auscultation bilaterally, no wheezing, no crackles. Normal respiratory effort. No accessory muscle use.  Cardiovascular: Regular rate and rhythm, no murmurs / rubs / gallops. No extremity edema. 2+ pedal pulses. No carotid bruits.  Abdomen: Scaphoid abdomen, no tenderness, no masses palpated, no hepatosplenomegaly. Bowel sounds positive.  Musculoskeletal: no clubbing / cyanosis. No joint deformity upper and lower extremities. Good ROM, no contractures, no atrophy. Normal muscle tone.  Skin: no rashes, lesions, ulcers. No induration Neurologic: Unable to assess sensation, strength Psychiatric: Unable to assess judgment, insight, alertness, orientation, mood  EKG: Not indicated at this time  Chest x-ray on Admission: I personally reviewed and I agree with radiologist reading as below.  CT Head Wo Contrast Result Date: 11/29/2023 CLINICAL DATA:  Mental status change, unknown cause. EXAM: CT HEAD WITHOUT CONTRAST TECHNIQUE: Contiguous axial images were obtained from the base of the skull through the vertex without intravenous contrast. RADIATION DOSE REDUCTION: This exam was performed according to the  departmental dose-optimization program which includes automated exposure control, adjustment of the mA and/or kV according to patient size and/or use of iterative reconstruction technique. COMPARISON:  Head CT 11/21/2023 FINDINGS: Brain: There is no evidence of an acute infarct, intracranial hemorrhage, mass, midline shift, or extra-axial fluid collection. Mild cerebral atrophy is within normal limits for age. The ventricles are normal in size. Vascular: No hyperdense vessel or unexpected calcification. Skull: No acute fracture or suspicious lesion. Sinuses/Orbits: No acute finding. Other: None. IMPRESSION: No evidence of acute intracranial abnormality. Electronically Signed   By: Sebastian Ache M.D.   On: 11/29/2023 14:05   Labs on Admission: I have personally reviewed following labs  CBC: Recent Labs  Lab 11/29/23 1241  WBC 3.5*  HGB 12.0*  HCT 34.8*  MCV 93.3  PLT 84*   Basic Metabolic Panel: Recent Labs  Lab 11/29/23 1241  NA 140  K 3.8  CL 110  CO2 23  GLUCOSE 114*  BUN 30*  CREATININE 1.55*  CALCIUM 9.9   GFR: Estimated Creatinine Clearance: 41.9 mL/min (A) (by C-G formula based on SCr of 1.55 mg/dL (H)).  Liver Function Tests: Recent Labs  Lab 11/29/23 1241  AST 40  ALT 30  ALKPHOS 59  BILITOT 1.6*  PROT 6.2*  ALBUMIN 2.5*   Recent Labs  Lab 11/29/23 1240  AMMONIA 174*   Cardiac Enzymes: Recent Labs  Lab 11/29/23 1241  CKTOTAL 60   CBG: Recent Labs  Lab 11/22/23 2101 11/23/23 0736 11/23/23 1141  GLUCAP 121* 82 107*   Urine analysis:    Component Value Date/Time   COLORURINE YELLOW (A) 11/12/2023 0230   APPEARANCEUR HAZY (A) 11/12/2023 0230   LABSPEC 1.016 11/12/2023 0230   PHURINE 6.0 11/12/2023 0230   GLUCOSEU 150 (A) 11/12/2023 0230   HGBUR NEGATIVE 11/12/2023 0230   BILIRUBINUR NEGATIVE 11/12/2023 0230   KETONESUR NEGATIVE 11/12/2023 0230   PROTEINUR NEGATIVE 11/12/2023 0230   NITRITE NEGATIVE 11/12/2023 0230   LEUKOCYTESUR NEGATIVE  11/12/2023 0230   This document was prepared  using Conservation officer, historic buildings and may include unintentional dictation errors.  Dr. Sedalia Muta [facility]  If 7PM-7AM, please contact overnight-coverage provider If 7AM-7PM, please contact day attending provider [email or web address]  11/29/2023, 5:34 PM

## 2023-11-29 NOTE — Assessment & Plan Note (Signed)
 At baseline

## 2023-11-29 NOTE — ED Triage Notes (Signed)
 Pt arrives via ems from a care home, [facility], pt was recently evaluated in the ER with similar symptoms, pt has his eyes open and mouth open, staring, will intermittently acknowledge staff, and denies feeling bad, pt has a hx of hepatic encephalopathy and leg bag for foley attached to pt on arrival

## 2023-11-29 NOTE — Hospital Course (Addendum)
 [name] is a 73 year old male with history of hepatic encephalopathy, liver cirrhosis, history of hepatitis C, hypertension, COPD, hyperlipidemia, non-insulin-dependent diabetes mellitus, depression, who presents from care home for chief concerns of altered mentation.  Vitals in the ED showed T of 98.1, rr 16, hr 48, blood pressure 152/75, SpO2 100% on room air.  Serum sodium is 140, potassium 3.8, chloride 110, bicarb 23, BUN 30, serum creatinine 1.55, EGFR 1.08, nonfasting blood glucose 114, WBC 3.4, hemoglobin 12.0, platelets of 84.  Ammonia level was elevated at 174.  CT head wo contrast: Was read as no acute intracranial abnormality.  ED treatment: Lactulose 30 g p.o. one-time dose, LR 1 L bolus.  4/5: Vitals with mild bradycardia, labs with improving creatinine to 1.36 and rest seems stable, urine cultures pending.  Oriented to self only, did not had any bowel movement with p.o. lactulose-also added enema. Patient also has a chronic Foley in place. Multiple hospitalization with concern of hepatic encephalopathy over the past 2 months-palliative care was also consulted. Ordered PT and OT evaluation as his prior group home might not take him back.  4/6: Persistent bradycardia which seems chronic, EKG was repeated today and shows chronic junctional rhythm, no acute abnormality.  Remained asymptomatic.  Patient need POA/guardianship.  Slight worsening of INR and T. bili-patient is high risk for mortality based on significant underlying comorbidities and advanced liver cirrhosis.  Having bowel movements now. Oriented to self only and still appears lethargic.  4/7: Hemodynamically stable.  Improving T. bili and INR.  Ammonia improved to 69.  Remains oriented to self only but talking more appropriately.  PT is recommending SNF-will be a difficult placement as he is a registered sex offender.  4/8: Remains stable and at baseline.  Having 1-2 daily bowel movements.   Urine cultures with  pretty good sensitivity Klebsiella pneumonia, patient has a chronic Foley in place, no fever or leukocytosis, decided to treat with Keflex due to increased risk. Awaiting placement.  He also need guardianship-so likely will stay in hospital for a long time.  4/9: Hemodynamically stable, improving physical strength so PT changed their recommendations for home health.  TOC to see if his prior group home can take him back.  4/10: Patient remained stable and now at his baseline.  Patient is being discharged back to his group home.  Group home will work with DSS for guardianship.  Patient is oriented to self most of the time and occasionally to place.  He should avoid constipation to prevent recurrent hospitalization.  His lactulose does need to be titrated and he should be getting at least 2 soft bowel movements daily.  Dose can be increased if that goal is not achieved and can be decreased if he develops diarrhea.  Home health services was also ordered for assistance.  Patient will remain high risk for readmission and mortality based on underlying comorbidities and advanced age.  He will continue on current medications and need to have a close follow-up with his providers for further assistance.

## 2023-11-29 NOTE — Assessment & Plan Note (Addendum)
 CBG with a lower goal but he was also NPO. Insulin SSI with at bedtime coverage, very sensitive dosing ordered

## 2023-11-30 DIAGNOSIS — K7460 Unspecified cirrhosis of liver: Secondary | ICD-10-CM

## 2023-11-30 DIAGNOSIS — R4182 Altered mental status, unspecified: Secondary | ICD-10-CM

## 2023-11-30 DIAGNOSIS — E782 Mixed hyperlipidemia: Secondary | ICD-10-CM

## 2023-11-30 DIAGNOSIS — E119 Type 2 diabetes mellitus without complications: Secondary | ICD-10-CM | POA: Diagnosis not present

## 2023-11-30 DIAGNOSIS — Z8619 Personal history of other infectious and parasitic diseases: Secondary | ICD-10-CM

## 2023-11-30 DIAGNOSIS — R001 Bradycardia, unspecified: Secondary | ICD-10-CM

## 2023-11-30 DIAGNOSIS — F332 Major depressive disorder, 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 psychotic features: Secondary | ICD-10-CM

## 2023-11-30 DIAGNOSIS — D696 Thrombocytopenia, unspecified: Secondary | ICD-10-CM

## 2023-11-30 DIAGNOSIS — N179 Acute kidney failure, unspecified: Secondary | ICD-10-CM | POA: Diagnosis not present

## 2023-11-30 DIAGNOSIS — E1165 Type 2 diabetes mellitus with hyperglycemia: Secondary | ICD-10-CM

## 2023-11-30 DIAGNOSIS — E7800 Pure hypercholesterolemia, unspecified: Secondary | ICD-10-CM

## 2023-11-30 DIAGNOSIS — K7682 Hepatic encephalopathy: Secondary | ICD-10-CM | POA: Diagnosis not present

## 2023-11-30 DIAGNOSIS — D61818 Other pancytopenia: Secondary | ICD-10-CM

## 2023-11-30 DIAGNOSIS — Z515 Encounter for palliative care: Secondary | ICD-10-CM | POA: Diagnosis not present

## 2023-11-30 DIAGNOSIS — E785 Hyperlipidemia, unspecified: Secondary | ICD-10-CM

## 2023-11-30 DIAGNOSIS — Z8673 Personal history of transient ischemic attack (TIA), and cerebral infarction without residual deficits: Secondary | ICD-10-CM

## 2023-11-30 DIAGNOSIS — I10 Essential (primary) hypertension: Secondary | ICD-10-CM

## 2023-11-30 LAB — BASIC METABOLIC PANEL WITH GFR
Anion gap: 6 (ref 5–15)
BUN: 28 mg/dL — ABNORMAL HIGH (ref 8–23)
CO2: 24 mmol/L (ref 22–32)
Calcium: 9.9 mg/dL (ref 8.9–10.3)
Chloride: 111 mmol/L (ref 98–111)
Creatinine, Ser: 1.36 mg/dL — ABNORMAL HIGH (ref 0.61–1.24)
GFR, Estimated: 55 mL/min — ABNORMAL LOW (ref >60)
Glucose, Bld: 89 mg/dL (ref 70–99)
Potassium: 3.9 mmol/L (ref 3.5–5.1)
Sodium: 141 mmol/L (ref 135–145)

## 2023-11-30 LAB — CBC
HCT: 33.5 % — ABNORMAL LOW (ref 39.0–52.0)
Hemoglobin: 11.8 g/dL — ABNORMAL LOW (ref 13.0–17.0)
MCH: 31.9 pg (ref 26.0–34.0)
MCHC: 35.2 g/dL (ref 30.0–36.0)
MCV: 90.5 fL (ref 80.0–100.0)
Platelets: 81 10*3/uL — ABNORMAL LOW (ref 150–400)
RBC: 3.7 MIL/uL — ABNORMAL LOW (ref 4.22–5.81)
RDW: 15.9 % — ABNORMAL HIGH (ref 11.5–15.5)
WBC: 3.8 10*3/uL — ABNORMAL LOW (ref 4.0–10.5)
nRBC: 0 % (ref 0.0–0.2)

## 2023-11-30 LAB — GLUCOSE, CAPILLARY
Glucose-Capillary: 72 mg/dL (ref 70–99)
Glucose-Capillary: 85 mg/dL (ref 70–99)
Glucose-Capillary: 114 mg/dL — ABNORMAL HIGH (ref 70–99)
Glucose-Capillary: 87 mg/dL (ref 70–99)

## 2023-11-30 MED ORDER — LACTULOSE ENEMA
300.0000 mL | Freq: Once | ORAL | Status: AC
  Administered 2023-11-30: 300 mL via RECTAL
  Filled 2023-11-30 (×2): qty 300

## 2023-11-30 MED ORDER — CHLORHEXIDINE GLUCONATE CLOTH 2 % EX PADS
6.0000 | MEDICATED_PAD | Freq: Every day | CUTANEOUS | Status: DC
  Administered 2023-11-30 – 2023-12-05 (×6): 6 via TOPICAL

## 2023-11-30 NOTE — Assessment & Plan Note (Signed)
 Creatinine improving with IV fluid. - Monitor renal function -Avoid nephrotoxins

## 2023-11-30 NOTE — Consult Note (Signed)
 Consultation Note Date: 11/30/2023   Patient Name: [name]  DOB: 1951-02-18  MRN: [id number]  Age / Sex: 73 y.o., male  PCP: [facility] Referring Physician: Arnetha Courser, MD  Reason for Consultation: Establishing goals of care   HPI/Brief Hospital Course: 73 y.o. male  with past medical history of history of hepatitis C, liver cirrhosis, hepatic encephalopathy, HTN, COPD, CHF, T2DM and depression admitted from group home on 11/29/2023 with AMS.  Ammonia found to be 174--hepatic encephalopathy  Noted 8 inpatient admits within 6 months PMT has closely followed in past admission, most recent 3/28   Palliative medicine was consulted for assisting with goals of care conversations.  Subjective:  Extensive chart review has been completed prior to meeting patient including labs, vital signs, imaging, progress notes, orders, and available advanced directive documents from current and previous encounters.  Visited with Mr. Frick at his bedside. He is awake, alert, aware he is in hospital-incorrectly states name of hospital, unclear on day, month or year and unaware of reasoning for current hospitalization. No visitors at bedside during time of visit.  As recommended during previous hospitalization (reference my note from 3/28) need for guardianship as due diligence has been exhausted in attempting to establish NOK (see Tiffanie Roxan Hockey note from 3/19). To avoid recurrent hospitalizations, Mr. Petro is in need of a surrogate decision maker as he suffers from a progressive chronic disease that impairs his ability to make his own complex medical decisions.   PMT will follow along and reengage once decision maker has been established.  Objective: Primary Diagnoses: Present on Admission:  Hepatic encephalopathy (HCC)  Uncontrolled type 2 diabetes mellitus with hyperglycemia, without long-term current use of insulin (HCC)   Thrombocytopenia (HCC)  Sinus bradycardia  Pure hypercholesterolemia  Pancytopenia (HCC)  MDD (major depressive disorder), recurrent severe, without psychosis (HCC)  Hyperlipidemia  (Resolved) Hyperlipidemia, unspecified  History of hepatitis C  HTN (hypertension)  AKI (acute kidney injury) (HCC)   Physical Exam Constitutional:      General: He is not in acute distress.    Appearance: He is ill-appearing.  Pulmonary:     Effort: Pulmonary effort is normal. No respiratory distress.  Skin:    Coloration: Skin is jaundiced.     Findings: Bruising present.  Neurological:     Mental Status: He is alert. He is disoriented.     Motor: Weakness present.     Vital Signs: BP 130/63 (BP Location: Left Arm)   Pulse (!) 51   Temp 98.1 F (36.7 C) (Oral)   Resp 14   Ht 5\' 6"  (1.676 m)   Wt 81.4 kg   SpO2 100%   BMI 28.96 kg/m  Pain Scale: 0-10   Pain Score: 0-No pain   IO: Intake/output summary:  Intake/Output Summary (Last 24 hours) at 11/30/2023 1728 Last data filed at 11/30/2023 1300 Gross per 24 hour  Intake 1037.73 ml  Output 1000 ml  Net 37.73 ml    LBM: Last BM Date :  (not able to get date of last BM) Baseline Weight: Weight: 81.4 kg Most recent weight: Weight: 81.4 kg      Assessment and Plan  SUMMARY OF RECOMMENDATIONS   Establishment of decision maker-likely to need guardianship through DSS  Palliative Prophylaxis:   Bowel Regimen, Delirium Protocol and Frequent Pain Assessment  Discussed With: Primary team and TOC   Thank you for this consult and allowing Palliative Medicine to participate in the care of Sharion Dove. Palliative  medicine will continue to follow and assist as needed.   Time Total: 55 minutes  Time spent includes: Detailed review of medical records (labs, imaging, vital signs), medically appropriate exam (mental status, respiratory, cardiac, skin), discussed with treatment team, counseling and educating patient, family and staff,  documenting clinical information, medication management and coordination of care.   Signed by: Leeanne Deed, DNP, AGNP-C Palliative Medicine    Please contact Palliative Medicine Team phone at [phone] for questions and concerns.  For individual provider: See Loretha Stapler

## 2023-11-30 NOTE — Plan of Care (Signed)
  Problem: Education: Goal: Ability to describe self-care measures that may prevent or decrease complications (Diabetes Survival Skills Education) will improve Outcome: Progressing Goal: Individualized Educational Video(s) Outcome: Progressing   Problem: Coping: Goal: Ability to adjust to condition or change in health will improve Outcome: Progressing   Problem: Fluid Volume: Goal: Ability to maintain a balanced intake and output will improve Outcome: Progressing   Problem: Metabolic: Goal: Ability to maintain appropriate glucose levels will improve Outcome: Progressing   Problem: Nutritional: Goal: Maintenance of adequate nutrition will improve Outcome: Progressing Goal: Progress toward achieving an optimal weight will improve Outcome: Progressing   Problem: Education: Goal: Knowledge of General Education information will improve Description: Including pain rating scale, medication(s)/side effects and non-pharmacologic comfort measures Outcome: Progressing   Problem: Tissue Perfusion: Goal: Adequacy of tissue perfusion will improve Outcome: Progressing   Problem: Health Behavior/Discharge Planning: Goal: Ability to manage health-related needs will improve Outcome: Progressing   Problem: Clinical Measurements: Goal: Ability to maintain clinical measurements within normal limits will improve Outcome: Progressing Goal: Will remain free from infection Outcome: Progressing Goal: Diagnostic test results will improve Outcome: Progressing Goal: Respiratory complications will improve Outcome: Progressing Goal: Cardiovascular complication will be avoided Outcome: Progressing   Problem: Activity: Goal: Risk for activity intolerance will decrease Outcome: Progressing   Problem: Nutrition: Goal: Adequate nutrition will be maintained Outcome: Progressing   Problem: Coping: Goal: Level of anxiety will decrease Outcome: Progressing   Problem: Pain Managment: Goal:  General experience of comfort will improve and/or be controlled Outcome: Progressing   Problem: Elimination: Goal: Will not experience complications related to bowel motility Outcome: Progressing Goal: Will not experience complications related to urinary retention Outcome: Progressing   Problem: Safety: Goal: Ability to remain free from injury will improve Outcome: Progressing   Problem: Skin Integrity: Goal: Risk for impaired skin integrity will decrease Outcome: Progressing

## 2023-11-30 NOTE — TOC Progression Note (Signed)
 Transition of Care [facility]) - Progression Note    Patient Details  Name: [name] MRN: [id number] Date of Birth: Jun 19, 1951  Transition of Care [facility]) CM/SW Contact  Rodney Langton, RN Phone Number: 11/30/2023, 2:31 PM  Clinical Narrative:     Sherron Monday with Doristine Mango at [facility] group home regarding concern for multiple admissions and possible need for skilled nursing rehab prior to discharge.  OT/PT evaluations requested/ordered.       Expected Discharge Plan and Services                                               Social Determinants of Health (SDOH) Interventions SDOH Screenings   Food Insecurity: No Food Insecurity (11/29/2023)  Housing: Low Risk  (11/29/2023)  Transportation Needs: No Transportation Needs (11/29/2023)  Utilities: Not At Risk (11/29/2023)  Alcohol Screen: Low Risk  (09/24/2022)  Depression (PHQ2-9): High Risk (04/26/2022)  Financial Resource Strain: High Risk (04/27/2022)  Physical Activity: Sufficiently Active (04/16/2018)  Social Connections: Socially Isolated (11/29/2023)  Stress: Stress Concern Present (04/16/2018)  Tobacco Use: High Risk (11/29/2023)    Readmission Risk Interventions    11/29/2023    5:03 PM 11/13/2023    2:21 PM 11/12/2023    4:00 PM  Readmission Risk Prevention Plan  Transportation Screening Not Complete  Not Complete  Medication Review Oceanographer) Not Complete Complete Not Complete  PCP or Specialist appointment within 3-5 days of discharge Not Complete Complete Complete  HRI or Home Care Consult Not Complete Complete   SW Recovery Care/Counseling Consult Not Complete Complete   Palliative Care Screening Not Applicable Complete   Skilled Nursing Facility Not Applicable Not Applicable

## 2023-11-30 NOTE — Progress Notes (Deleted)
  Electrophysiology Office Note:   Date:  11/30/2023  ID:  [name], DOB September 29, 1950, MRN [id number]  Primary Cardiologist: None Electrophysiologist: Nobie Putnam, MD  {Click to update primary MD,subspecialty MD or APP then REFRESH:1}    History of Present Illness:   [name] is a 73 y.o. male with h/o T2DM, HTN, COPD, migraines, hepatitis C cirrhosis, TIA, depression, anxiety obstructive sleep apnea, cervical stenosis status post C3-6 ACDF, hyperlipidemia, CKD and chronic diastolic heart failure seen today for concern for junctional rhythm.  He has had 5 ED visits/hospital admissions in the month of March alone.  Discussed the use of AI scribe software for clinical note transcription with the patient, who gave verbal consent to proceed.  History of Present Illness     Review of systems complete and found to be negative unless listed in HPI.   EP Information / Studies Reviewed:    EKG is not ordered today. EKG from 11/21/23 reviewed which showed sinus bradycardia      Echo 12/24/22:  1. Left ventricular ejection fraction, by estimation, is 60 to 65%. The  left ventricle has normal function. The left ventricle has no regional  wall motion abnormalities. There is mild left ventricular hypertrophy.  Left ventricular diastolic parameters  were normal.   2. Right ventricular systolic function is normal. The right ventricular  size is normal. Tricuspid regurgitation signal is inadequate for assessing  PA pressure.   3. Left atrial size was mildly dilated.   4. The mitral valve is normal in structure. No evidence of mitral valve  regurgitation. No evidence of mitral stenosis.   5. The aortic valve is normal in structure. Aortic valve regurgitation is  not visualized. No aortic stenosis is present.       Physical Exam:   VS:  There were no vitals taken for this visit.   Wt Readings from Last 3 Encounters:  11/29/23 179 lb 7.3 oz (81.4 kg)  11/21/23 181 lb (82.1 kg)   11/11/23 177 lb 4 oz (80.4 kg)     GEN: Well nourished, well developed in no acute distress NECK: No JVD CARDIAC: {EPRHYTHM:28826}, no murmurs, rubs, gallops RESPIRATORY:  Clear to auscultation without rales, wheezing or rhonchi  ABDOMEN: Soft, non-distended EXTREMITIES:  No edema; No deformity   ASSESSMENT AND PLAN:    #. Sinus bradycardia: EKGs in question are not junctional rhythm but rather sinus bradycardia with low amplitude P waves.  #. Hypertension *** goal today.  Recommend checking blood pressures 1-2 times per week at home and recording the values.  Recommend bringing these recordings to the primary care physician.   Follow up with {GNFAO:13086} {EPFOLLOW VH:84696}  Signed, Nobie Putnam, MD

## 2023-11-30 NOTE — Plan of Care (Addendum)
 Patient is alert and oriented X 1. Lactulose enema given, had a large bowel movement. Denies any pain. Plan of care ongoing.  Problem: Education: Goal: Ability to describe self-care measures that may prevent or decrease complications (Diabetes Survival Skills Education) will improve Outcome: Progressing Goal: Individualized Educational Video(s) Outcome: Progressing   Problem: Coping: Goal: Ability to adjust to condition or change in health will improve Outcome: Progressing   Problem: Fluid Volume: Goal: Ability to maintain a balanced intake and output will improve Outcome: Progressing   Problem: Health Behavior/Discharge Planning: Goal: Ability to identify and utilize available resources and services will improve Outcome: Progressing Goal: Ability to manage health-related needs will improve Outcome: Progressing   Problem: Metabolic: Goal: Ability to maintain appropriate glucose levels will improve Outcome: Progressing   Problem: Nutritional: Goal: Maintenance of adequate nutrition will improve Outcome: Progressing Goal: Progress toward achieving an optimal weight will improve Outcome: Progressing   Problem: Skin Integrity: Goal: Risk for impaired skin integrity will decrease Outcome: Progressing   Problem: Skin Integrity: Goal: Risk for impaired skin integrity will decrease Outcome: Progressing

## 2023-11-30 NOTE — Progress Notes (Addendum)
 Progress Note   Patient: [name] BJY:[id number] DOB: Jan 08, 1951 DOA: 11/29/2023     1 DOS: the patient was seen and examined on 11/30/2023   Brief hospital course: Mr. Haji Delaine is a 73 year old male with history of hepatic encephalopathy, liver cirrhosis, history of hepatitis C, hypertension, COPD, hyperlipidemia, non-insulin-dependent diabetes mellitus, depression, who presents from care home for chief concerns of altered mentation.  Vitals in the ED showed T of 98.1, rr 16, hr 48, blood pressure 152/75, SpO2 100% on room air.  Serum sodium is 140, potassium 3.8, chloride 110, bicarb 23, BUN 30, serum creatinine 1.55, EGFR 1.08, nonfasting blood glucose 114, WBC 3.4, hemoglobin 12.0, platelets of 84.  Ammonia level was elevated at 174.  CT head wo contrast: Was read as no acute intracranial abnormality.  ED treatment: Lactulose 30 g p.o. one-time dose, LR 1 L bolus.  4/5: Vitals with mild bradycardia, labs with improving creatinine to 1.36 and rest seems stable, urine cultures pending.  Oriented to self only, did not had any bowel movement with p.o. lactulose-also added enema. Patient also has a chronic Foley in place. Multiple hospitalization with concern of hepatic encephalopathy over the past 2 months-palliative care was also consulted. Ordered PT and OT evaluation as his prior group home might not take him back.  Assessment and Plan: * Hepatic encephalopathy (HCC) CT head without any acute abnormality and significantly elevated ammonia which is consistent with hepatic encephalopathy. Home lactulose resumed on admission Added lactulose enema as there was no bowel movement with p.o. lactulose Rifaximin 550 mg p.o. recent on admission Continue with LR infusion at 125 mL/h, 1 day ordered Started on diet and he will complete IV fluid today Aspiration, fall precaution  Diabetes mellitus type 2, noninsulin dependent (HCC) CBG with a lower goal but he was also NPO. Insulin  SSI with at bedtime coverage, very sensitive dosing ordered  Hx of transient ischemic attack (TIA) Home aspirin and atorvastatin resumed on admission  AKI (acute kidney injury) (HCC) Creatinine improving with IV fluid. - Monitor renal function -Avoid nephrotoxins  Hyperlipidemia Home atorvastatin 40 mg daily resume for 4/5  HTN (hypertension) Home losartan 50 mg daily, spironolactone 25 mg daily, furosemide 20 mg daily were resumed on admission  Thrombocytopenia (HCC) At baseline   Subjective: Patient was seen and examined today.  Oriented to self only.  No agitation.  Denies any pain.  Wants to eat.  No nausea or vomiting.  Did not had any bowel movement yet.  Physical Exam: Vitals:   11/29/23 2246 11/30/23 0800 11/30/23 0816 11/30/23 1105  BP: (!) 152/75 (!) 143/68 (!) 143/68 139/70  Pulse: (!) 49 (!) 47 (!) 47 (!) 47  Resp: 16 18 17 18   Temp: 98.6 F (37 C) 99.1 F (37.3 C) 99.1 F (37.3 C) 98.1 F (36.7 C)  TempSrc: Oral Oral Oral Oral  SpO2: 100% 97% 97% 97%  Weight:      Height:       General.  Frail elderly man, in no acute distress. Pulmonary.  Lungs clear bilaterally, normal respiratory effort. CV.  Regular rate and rhythm, no JVD, rub or murmur. Abdomen.  Soft, nontender, nondistended, BS positive. CNS.  Alert and oriented to self only.  No focal neurologic deficit. Extremities.  No edema, no cyanosis, pulses intact and symmetrical. Psychiatry.  Judgment and insight appears impaired  Data Reviewed: Prior data reviewed  Family Communication: Tried calling a friend listed in his contacts with no response.  Disposition: Status is:  Inpatient Remains inpatient appropriate because: Severity of illness  Planned Discharge Destination: Home  DVT prophylaxis.  Subcu heparin Time spent: 50 minutes  This record has been created using Conservation officer, historic buildings. Errors have been sought and corrected,but may not always be located. Such creation errors do  not reflect on the standard of care.   Author: Arnetha Courser, MD 11/30/2023 2:13 PM  For on call review [email or web address].

## 2023-12-01 DIAGNOSIS — R001 Bradycardia, unspecified: Secondary | ICD-10-CM

## 2023-12-01 DIAGNOSIS — Z515 Encounter for palliative care: Secondary | ICD-10-CM

## 2023-12-01 DIAGNOSIS — R531 Weakness: Secondary | ICD-10-CM | POA: Diagnosis not present

## 2023-12-01 DIAGNOSIS — K7682 Hepatic encephalopathy: Secondary | ICD-10-CM | POA: Diagnosis not present

## 2023-12-01 DIAGNOSIS — Z8619 Personal history of other infectious and parasitic diseases: Secondary | ICD-10-CM | POA: Diagnosis not present

## 2023-12-01 LAB — COMPREHENSIVE METABOLIC PANEL WITH GFR
ALT: 24 U/L (ref 0–44)
AST: 33 U/L (ref 15–41)
Albumin: 2.3 g/dL — ABNORMAL LOW (ref 3.5–5.0)
Alkaline Phosphatase: 51 U/L (ref 38–126)
Anion gap: 5 (ref 5–15)
BUN: 23 mg/dL (ref 8–23)
CO2: 24 mmol/L (ref 22–32)
Calcium: 9.5 mg/dL (ref 8.9–10.3)
Chloride: 109 mmol/L (ref 98–111)
Creatinine, Ser: 1.07 mg/dL (ref 0.61–1.24)
GFR, Estimated: >60 mL/min (ref >60)
Glucose, Bld: 95 mg/dL (ref 70–99)
Potassium: 3.5 mmol/L (ref 3.5–5.1)
Sodium: 138 mmol/L (ref 135–145)
Total Bilirubin: 1.8 mg/dL — ABNORMAL HIGH (ref 0.0–1.2)
Total Protein: 5.6 g/dL — ABNORMAL LOW (ref 6.5–8.1)

## 2023-12-01 LAB — GLUCOSE, CAPILLARY
Glucose-Capillary: 80 mg/dL (ref 70–99)
Glucose-Capillary: 125 mg/dL — ABNORMAL HIGH (ref 70–99)
Glucose-Capillary: 81 mg/dL (ref 70–99)

## 2023-12-01 LAB — CBC
HCT: 29 % — ABNORMAL LOW (ref 39.0–52.0)
Hemoglobin: 10.2 g/dL — ABNORMAL LOW (ref 13.0–17.0)
MCH: 31.8 pg (ref 26.0–34.0)
MCHC: 35.2 g/dL (ref 30.0–36.0)
MCV: 90.3 fL (ref 80.0–100.0)
Platelets: 74 10*3/uL — ABNORMAL LOW (ref 150–400)
RBC: 3.21 MIL/uL — ABNORMAL LOW (ref 4.22–5.81)
RDW: 15.5 % (ref 11.5–15.5)
WBC: 3.7 10*3/uL — ABNORMAL LOW (ref 4.0–10.5)
nRBC: 0 % (ref 0.0–0.2)

## 2023-12-01 LAB — PROTIME-INR
INR: 1.4 — ABNORMAL HIGH (ref 0.8–1.2)
Prothrombin Time: 17.7 s — ABNORMAL HIGH (ref 11.4–15.2)

## 2023-12-01 NOTE — Assessment & Plan Note (Signed)
 CT head without any acute abnormality and significantly elevated ammonia which is consistent with hepatic encephalopathy. Home lactulose resumed on admission Started having bowel movements. Rifaximin 550 mg p.o. recent on admission Started on diet  Aspiration, fall precaution

## 2023-12-01 NOTE — Plan of Care (Signed)

## 2023-12-01 NOTE — Progress Notes (Signed)
 Physical Therapy Evaluation Patient Details Name: [name] MRN: [id number] DOB: Nov 07, 1950 Today's Date: 12/01/2023  History of Present Illness  Pt is a 73 y/o male admitted secondary to AMS. Pt found to have Hepatic encephalopathy. PMH including but not limited to hepatitis C, liver cirrhosis, hepatic encephalopathy, HTN, COPD, CHF, T2DM and depression.   Clinical Impression  Pt presented supine in bed with HOB elevated, awake and willing to participate in therapy session. Pt with cognitive deficits at baseline and a poor historian with no family/caregivers present during evaluation. Pt reported that he lives at a group home where they assist him by providing meals, but do not physically help him with any functional mobility or ADL needs at this time. At the time of evaluation, pt required min A for bed mobility, min A for transfers and was only able to take 2-3 side steps at EOB with RW and PT providing min A and multimodal cueing for sequencing and technique. At this time, PT recommending further intensive therapy services (<3 hrs/day) at a SNF to maximize his safety and independence with functional mobility prior to returning to his group home. Pt would continue to benefit from skilled physical therapy services at this time while admitted and after d/c to address the below listed limitations in order to improve overall safety and independence with functional mobility.         If plan is discharge home, recommend the following: A lot of help with walking and/or transfers;A lot of help with bathing/dressing/bathroom;Assistance with cooking/housework;Supervision due to cognitive status;Help with stairs or ramp for entrance;Assist for transportation   Can travel by private vehicle   No    Equipment Recommendations Other (comment) (defer to next venue of care)  Recommendations for Other Services       Functional Status Assessment Patient has had a recent decline in their functional  status and demonstrates the ability to make significant improvements in function in a reasonable and predictable amount of time.     Precautions / Restrictions Precautions Precautions: Fall Restrictions Weight Bearing Restrictions Per Provider Order: No      Mobility  Bed Mobility Overal bed mobility: Needs Assistance Bed Mobility: Supine to Sit, Sit to Supine     Supine to sit: Supervision Sit to supine: Min assist   General bed mobility comments: greatly increased time and effort needed to achieve an upright sitting position at EOB from supine; however, no physical assistance needed. With returning to supine, pt required min A to return bilateral LEs onto bed    Transfers Overall transfer level: Needs assistance Equipment used: Rolling walker (2 wheels) Transfers: Sit to/from Stand Sit to Stand: Min assist           General transfer comment: increased time and effort, cueing for safe hand placement, min A needed for stability with transitional movements.    Ambulation/Gait               General Gait Details: Several attempts made to take side steps at EOB with RW and mutlimodal cueing. Finally after several trials, pt able to take 2-3 side steps towards his L with PT providing multimodal cueing and CGA throughout  Stairs            Wheelchair Mobility     Tilt Bed    Modified Rankin (Stroke Patients Only)       Balance Overall balance assessment: Needs assistance Sitting-balance support: Feet supported Sitting balance-Leahy Scale: Good     Standing balance  support: During functional activity, Bilateral upper extremity supported Standing balance-Leahy Scale: Poor Standing balance comment: reliant on AD                             Pertinent Vitals/Pain Pain Assessment Pain Assessment: No/denies pain    Home Living Family/patient expects to be discharged to:: Group home Living Arrangements: Group Home Available Help at  Discharge: Personal care attendant Type of Home: Group Home Home Access: Ramped entrance       Home Layout: One level Home Equipment: Rolling Walker (2 wheels);Educational psychologist (4 wheels) Additional Comments: per chart review of recent admissions, pt poor historian    Prior Function Prior Level of Function : Needs assist;History of Falls (last six months)             Mobility Comments: Per chart review of recent admission - Pt was modified independent ambulating limited community distances with RW; h/o falls ADLs Comments: Group home staff assists with ADL's, IADL's, and medication management     Extremity/Trunk Assessment   Upper Extremity Assessment Upper Extremity Assessment: Defer to OT evaluation    Lower Extremity Assessment Lower Extremity Assessment: Generalized weakness;Difficult to assess due to impaired cognition       Communication   Communication Communication: Impaired Factors Affecting Communication: Hearing impaired    Cognition Arousal: Alert Behavior During Therapy: WFL for tasks assessed/performed   PT - Cognitive impairments: History of cognitive impairments                       PT - Cognition Comments: Pt able to follow one-step commands with increased time and multimodal cueing, pt with great difficulties noted with motor planning and sequencing of motor tasks Following commands: Impaired Following commands impaired: Follows one step commands with increased time     Cueing Cueing Techniques: Verbal cues, Gestural cues, Tactile cues, Visual cues     General Comments      Exercises     Assessment/Plan    PT Assessment Patient needs continued PT services  PT Problem List Decreased strength;Decreased range of motion;Decreased activity tolerance;Decreased balance;Decreased mobility;Decreased coordination;Decreased cognition;Decreased knowledge of use of DME;Decreased knowledge of precautions;Decreased safety awareness        PT Treatment Interventions DME instruction;Gait training;Stair training;Functional mobility training;Therapeutic exercise;Therapeutic activities;Balance training;Neuromuscular re-education;Patient/family education    PT Goals (Current goals can be found in the Care Plan section)  Acute Rehab PT Goals Patient Stated Goal: return to his group home PT Goal Formulation: With patient Time For Goal Achievement: 12/15/23 Potential to Achieve Goals: Good    Frequency Min 3X/week     Co-evaluation               AM-PAC PT "6 Clicks" Mobility  Outcome Measure Help needed turning from your back to your side while in a flat bed without using bedrails?: A Little Help needed moving from lying on your back to sitting on the side of a flat bed without using bedrails?: A Little Help needed moving to and from a bed to a chair (including a wheelchair)?: A Little Help needed standing up from a chair using your arms (e.g., wheelchair or bedside chair)?: A Little Help needed to walk in hospital room?: A Lot Help needed climbing 3-5 steps with a railing? : Total 6 Click Score: 15    End of Session Equipment Utilized During Treatment: Gait belt Activity Tolerance: Patient tolerated treatment well Patient left:  in bed;with call bell/phone within reach;with bed alarm set Nurse Communication: Mobility status PT Visit Diagnosis: Other abnormalities of gait and mobility (R26.89)    Time: 1610-9604 PT Time Calculation (min) (ACUTE ONLY): 19 min   Charges:   PT Evaluation $PT Eval Moderate Complexity: 1 Mod   PT General Charges $$ ACUTE PT VISIT: 1 Visit         Arletta Bale, DPT  Acute Rehabilitation Services Office [phone]   Alessandra Bevels Raelyn Racette 12/01/2023, 2:19 PM

## 2023-12-01 NOTE — Progress Notes (Signed)
 pt has been running bradycardia in tele, HR goes down to 45. Dr Nelson Chimes made aware. EKG done. Plan of care ongoing.

## 2023-12-01 NOTE — Assessment & Plan Note (Signed)
 Asymptomatic and seems chronic.  Repeat EKG was obtained and it shows chronic junctional rhythm and no acute changes Heart rate in 60s today -Continue to monitor -Avoid nodal blocking agents

## 2023-12-01 NOTE — Progress Notes (Signed)
                                                                                                                                                                                                           Daily Progress Note   Patient Name: [name]       Date: 12/01/2023 DOB: May 02, 1951  Age: 73 y.o. MRN#: [id number] Attending Physician: Arnetha Courser, MD Primary Care Physician: [facility] Admit Date: 11/29/2023  Reason for Consultation/Follow-up: Establishing goals of care  HPI/Brief Hospital Review: 73 y.o. male  with past medical history of history of hepatitis C, liver cirrhosis, hepatic encephalopathy, HTN, COPD, CHF, T2DM and depression admitted from group home on 11/29/2023 with AMS.   Ammonia found to be 174--hepatic encephalopathy   Noted 8 inpatient admits within 6 months PMT has closely followed in past admission, most recent 3/28    Palliative medicine was consulted for assisting with goals of care conversations.  Subjective: Extensive chart review has been completed prior to meeting patient including labs, vital signs, imaging, progress notes, orders, and available advanced directive documents from current and previous encounters.    Visited with [name] at his bedside. He is awake, alert, remains confused but able to state he is in hospital and is here for "my bad liver."  Attempted to elicit goals of care with [name] but during our conversations he references "a lady at his facility who recommends he continue to return to the hospital, unsure of the reasoning." He also continually requests something to drink. He shares he has been taking his medicine every day at his facility. He denies pain or discomfort.  He remains without capacity to be involved in complex medical decision making. Unsure of capacity at baseline. Continue to recommend guardianship be established for [name].  Care plan was discussed with [facility], nursing staff and primary  team  Thank you for allowing the Palliative Medicine Team to assist in the care of this patient.  Total time:  35 minutes  Time spent includes: Detailed review of medical records (labs, imaging, vital signs), medically appropriate exam (mental status, respiratory, cardiac, skin), discussed with treatment team, counseling and educating patient, family and staff, documenting clinical information, medication management and coordination of care.  Leeanne Deed, DNP, AGNP-C Palliative Medicine   Please contact Palliative Medicine Team phone at [phone] for questions and concerns.

## 2023-12-01 NOTE — Progress Notes (Signed)
 Progress Note   Patient: [name] UJW:[id number] DOB: 06-10-1951 DOA: 11/29/2023     2 DOS: the patient was seen and examined on 12/01/2023   Brief hospital course: [name] is a 73 year old male with history of hepatic encephalopathy, liver cirrhosis, history of hepatitis C, hypertension, COPD, hyperlipidemia, non-insulin-dependent diabetes mellitus, depression, who presents from care home for chief concerns of altered mentation.  Vitals in the ED showed T of 98.1, rr 16, hr 48, blood pressure 152/75, SpO2 100% on room air.  Serum sodium is 140, potassium 3.8, chloride 110, bicarb 23, BUN 30, serum creatinine 1.55, EGFR 1.08, nonfasting blood glucose 114, WBC 3.4, hemoglobin 12.0, platelets of 84.  Ammonia level was elevated at 174.  CT head wo contrast: Was read as no acute intracranial abnormality.  ED treatment: Lactulose 30 g p.o. one-time dose, LR 1 L bolus.  4/5: Vitals with mild bradycardia, labs with improving creatinine to 1.36 and rest seems stable, urine cultures pending.  Oriented to self only, did not had any bowel movement with p.o. lactulose-also added enema. Patient also has a chronic Foley in place. Multiple hospitalization with concern of hepatic encephalopathy over the past 2 months-palliative care was also consulted. Ordered PT and OT evaluation as his prior group home might not take him back.  4/6: Persistent bradycardia which seems chronic, EKG was repeated today and shows chronic junctional rhythm, no acute abnormality.  Remained asymptomatic.  Patient need POA/guardianship.  Slight worsening of INR and T. bili-patient is high risk for mortality based on significant underlying comorbidities and advanced liver cirrhosis.  Having bowel movements now. Oriented to self only and still appears lethargic.  Assessment and Plan: * Hepatic encephalopathy (HCC) CT head without any acute abnormality and significantly elevated ammonia which is consistent with  hepatic encephalopathy. Home lactulose resumed on admission Started having bowel movements. Rifaximin 550 mg p.o. recent on admission Started on diet  Aspiration, fall precaution  Diabetes mellitus type 2, noninsulin dependent (HCC) CBG with a lower goal but he was also NPO. Insulin SSI with at bedtime coverage, very sensitive dosing ordered  Hx of transient ischemic attack (TIA) Home aspirin and atorvastatin resumed on admission  AKI (acute kidney injury) (HCC) Resolved - Monitor renal function -Avoid nephrotoxins  Sinus bradycardia Asymptomatic and seems chronic.  Repeat EKG was obtained and it shows chronic junctional rhythm and no acute changes -Continue to monitor -Avoid nodal blocking agents  Hyperlipidemia Home atorvastatin 40 mg daily resume for 4/5  HTN (hypertension) Home losartan 50 mg daily, spironolactone 25 mg daily, furosemide 20 mg daily were resumed on admission  Thrombocytopenia (HCC) At baseline   Subjective: Patient was lying comfortably when seen today.  Denies any new concern.  Remains oriented to self only  Physical Exam: Vitals:   11/30/23 2003 12/01/23 0021 12/01/23 0837 12/01/23 1226  BP: (!) 126/53 115/61 118/75 (!) 111/53  Pulse: (!) 52 (!) 51 (!) 49 (!) 43  Resp: 12 10 16 16   Temp: 99 F (37.2 C) 98.4 F (36.9 C) 98.4 F (36.9 C) 97.7 F (36.5 C)  TempSrc: Oral     SpO2: 100%  100% 99%  Weight:      Height:       General.  Frail elderly man, in no acute distress. Pulmonary.  Lungs clear bilaterally, normal respiratory effort. CV.  Regular rate and rhythm, no JVD, rub or murmur. Abdomen.  Soft, nontender, nondistended, BS positive. CNS.  Awake but oriented to self only.  No  focal neurologic deficit. Extremities.  No edema, no cyanosis, pulses intact and symmetrical.  Data Reviewed: Prior data reviewed  Family Communication: Tried calling a friend listed in his contacts with no response.  Disposition: Status is:  Inpatient Remains inpatient appropriate because: Severity of illness  Planned Discharge Destination: Home  DVT prophylaxis.  Subcu heparin Time spent: 45 minutes  This record has been created using Conservation officer, historic buildings. Errors have been sought and corrected,but may not always be located. Such creation errors do not reflect on the standard of care.   Author: Arnetha Courser, MD 12/01/2023 2:20 PM  For on call review [email or web address].

## 2023-12-01 NOTE — Progress Notes (Signed)
 Occupational Therapy Evaluation Patient Details Name: [name] MRN: [id number] DOB: [birth year] Today's Date: 12/01/2023   History of Present Illness   Pt is a 73 y/o male admitted secondary to AMS. Pt found to have Hepatic encephalopathy. PMH including but not limited to hepatitis C, liver cirrhosis, hepatic encephalopathy, HTN, COPD, CHF, T2DM and depression.     Clinical Impressions Pt was seen for OT evaluation this date. Prior to hospital admission, Per chart review of recent admission: Pt was modified independent ambulating limited community distances with RW, living at a Group Home. Pt is dependent on Group home staff for assistance with ADL/IADLs. On this date of eval pt was alert and oriented to name and place only. Pt presents to acute OT demonstrating impaired ADL performance, activity tolerance, generalized weakness and functional mobility 2/2 (See OT problem list for additional functional deficits). Pt completed getting his LEs halfway off the bed before stoping and need verbal encouragement to continue. LE management; MINA to return to bed, great effort throughout sit <> supine. Noted improvements from previous admissions; pt's sitting tolerance while seated on EOB ~21mins with no external support during grooming tasks. Pt is cooperative and willing to work the therapy. Pt would benefit from skilled OT services to address noted impairments and functional limitations (see below for any additional details) in order to maximize safety and independence while minimizing falls risk and caregiver burden. OT will follow acutely.     If plan is discharge home, recommend the following:   Assistance with feeding;Assist for transportation;Direct supervision/assist for medications management;Help with stairs or ramp for entrance;Supervision due to cognitive status;Direct supervision/assist for financial management;Assistance with cooking/housework;A lot of help with bathing/dressing/bathroom;A  lot of help with walking and/or transfers     Functional Status Assessment   Patient has had a recent decline in their functional status and demonstrates the ability to make significant improvements in function in a reasonable and predictable amount of time.     Equipment Recommendations   Other (comment)     Recommendations for Other Services         Precautions/Restrictions   Precautions Precautions: Fall Recall of Precautions/Restrictions: Impaired Restrictions Weight Bearing Restrictions Per Provider Order: No     Mobility Bed Mobility Overal bed mobility: Needs Assistance Bed Mobility: Supine to Sit, Sit to Supine     Supine to sit: Contact guard Sit to supine: Min assist   General bed mobility comments: Pt completed getting feet halfway off the bed before stoping and need verbal encouragement to continue. LE management; MINA to return to bed, great effort throughout sit <> supine    Transfers Overall transfer level: Needs assistance Equipment used: Rolling walker (2 wheels) Transfers: Sit to/from Stand Sit to Stand: Min assist           General transfer comment: Pt completed STS from EOB with MINA for cues reguarding handplacement with RW. Pt took 3 lateral steps up towards the [facility] with step by step verbal /tactile cues for amb/DME management + MINA for weightshifiting.      Balance Overall balance assessment: Needs assistance Sitting-balance support: Feet supported Sitting balance-Leahy Scale: Good Sitting balance - Comments: Noted improved sitting balance from previous hospital admissions, verbal/tactile cues required for pt to initate sitting balance without external supports   Standing balance support: During functional activity, Bilateral upper extremity supported Standing balance-Leahy Scale: Poor Standing balance comment: Pt reliant on RW standing at bed side  ADL either performed or assessed with  clinical judgement   ADL Overall ADL's : Needs assistance/impaired Eating/Feeding: Minimal assistance   Grooming: Wash/dry face;Wash/dry hands;Sitting;Maximal assistance (EOB)               Lower Body Dressing: Maximal assistance (Donning socks while seated on EOB)               Functional mobility during ADLs: Rolling walker (2 wheels);Cueing for sequencing;Minimal assistance General ADL Comments: Pt participated in face/hand washing while seated on the EOB, cues for task initation, and sequencing throughout pt attempt. MAX physical assistance required to complete tasks.      Pertinent Vitals/Pain Pain Assessment Pain Assessment: PAINAD Breathing: normal Negative Vocalization: none Facial Expression: smiling or inexpressive Body Language: relaxed Consolability: no need to console PAINAD Score: 0     Extremity/Trunk Assessment Upper Extremity Assessment Upper Extremity Assessment: Generalized weakness;Overall [facility] for tasks assessed   Lower Extremity Assessment Lower Extremity Assessment: Generalized weakness;Defer to PT evaluation       Communication Communication Communication: Impaired Factors Affecting Communication: Hearing impaired   Cognition Arousal: Alert Behavior During Therapy: WFL for tasks assessed/performed Cognition: History of cognitive impairments   Orientation impairments: Time, Situation   Memory impairment (select all impairments): Short-term memory Attention impairment (select first level of impairment): Focused attention Executive functioning impairment (select all impairments): Initiation, Sequencing, Reasoning, Problem solving, Organization                   Following commands: Impaired Following commands impaired: Follows one step commands inconsistently     Cueing  General Comments   Cueing Techniques: Verbal cues;Gestural cues;Tactile cues;Visual cues  Pleasant and willing to work with therapy   Exercises  Exercises: Other exercises Other Exercises Other Exercises: Edu: Role of OT, d/c planning, handplacement during amb/transfers with RW   Shoulder Instructions      Home Living Family/patient expects to be discharged to:: Group home Living Arrangements: Group Home Available Help at Discharge: Personal care attendant Type of Home: Group Home Home Access: Ramped entrance     Home Layout: One level     Bathroom Shower/Tub: Producer, television/film/video: Handicapped height     Home Equipment: Agricultural consultant (2 wheels);Educational psychologist (4 wheels)   Additional Comments: per chart review of recent admissions, pt poor historian      Prior Functioning/Environment Prior Level of Function : Needs assist;History of Falls (last six months)             Mobility Comments: Per chart review of recent admission - Pt was modified independent ambulating limited community distances with RW; h/o falls ADLs Comments: Group home staff assists with ADL's, IADL's, and medication management    OT Problem List: Decreased coordination;Decreased cognition;Decreased safety awareness;Decreased knowledge of use of DME or AE;Decreased strength;Decreased activity tolerance;Impaired balance (sitting and/or standing);Decreased knowledge of precautions;Pain   OT Treatment/Interventions: Self-care/ADL training;Therapeutic activities;DME and/or AE instruction;Cognitive remediation/compensation;Energy conservation;Balance training;Therapeutic exercise;Neuromuscular education;Patient/family education      OT Goals(Current goals can be found in the care plan section)   Acute Rehab OT Goals Patient Stated Goal: get better OT Goal Formulation: With patient Time For Goal Achievement: 12/15/23 Potential to Achieve Goals: Good ADL Goals Pt Will Perform Grooming: standing;with min assist Pt Will Perform Lower Body Dressing: with min assist;sit to/from stand Pt Will Transfer to Toilet: with contact guard  assist;ambulating Pt Will Perform Toileting - Clothing Manipulation and hygiene: with contact guard assist;sitting/lateral leans   OT Frequency:  Min 2X/week    Co-evaluation              AM-PAC OT "6 Clicks" Daily Activity     Outcome Measure Help from another person eating meals?: A Little Help from another person taking care of personal grooming?: A Little Help from another person toileting, which includes using toliet, bedpan, or urinal?: A Lot Help from another person bathing (including washing, rinsing, drying)?: A Lot Help from another person to put on and taking off regular upper body clothing?: A Lot Help from another person to put on and taking off regular lower body clothing?: A Lot 6 Click Score: 14   End of Session Equipment Utilized During Treatment: Gait belt;Rolling walker (2 wheels) Nurse Communication: Mobility status  Activity Tolerance: Patient tolerated treatment well Patient left: in bed;with call bell/phone within reach;with bed alarm set  OT Visit Diagnosis: Unsteadiness on feet (R26.81);Other abnormalities of gait and mobility (R26.89);Muscle weakness (generalized) (M62.81);Other symptoms and signs involving cognitive function                Time: 1424-1440 OT Time Calculation (min): 16 min Charges:  OT General Charges $OT Visit: 1 Visit OT Evaluation $OT Eval Moderate Complexity: 1 Mod  Crystalmarie Yasin M.S. OTR/L  12/01/23, 3:06 PM

## 2023-12-01 NOTE — TOC Progression Note (Signed)
 Transition of Care [facility]) - Progression Note    Patient Details  Name: [name] MRN: [id number] Date of Birth: 1950-12-11  Transition of [facility]) CM/SW Contact  Bing Quarry, RN Phone Number: 12/01/2023, 11:43 AM  Clinical Narrative: 4/6: Recommendations is that DSS needs to be involved in getting guardianship Monday.  [name] from group home at [place] discussed this but needs to be done prior to discharge per discussion/chat with palliative NP, Serita Grit, and provider and involving DSS.     Tiffanie Robinson RN also following up on this.   Gabriel Cirri MSN RN CM  RN Case Manager [place]  Transitions of Care Direct Dial: [phone] (Weekends Only) [facility] Main Office Phone: [phone] [facility] Fax: [phone] .com          Expected Discharge Plan and Services                                               Social Determinants of Health (SDOH) Interventions SDOH Screenings   Food Insecurity: No Food Insecurity (11/29/2023)  Housing: Low Risk  (11/29/2023)  Transportation Needs: No Transportation Needs (11/29/2023)  Utilities: Not At Risk (11/29/2023)  Alcohol Screen: Low Risk  (09/24/2022)  Depression (PHQ2-9): High Risk (04/26/2022)  Financial Resource Strain: High Risk (04/27/2022)  Physical Activity: Sufficiently Active (04/16/2018)  Social Connections: Socially Isolated (11/29/2023)  Stress: Stress Concern Present (04/16/2018)  Tobacco Use: High Risk (11/29/2023)    Readmission Risk Interventions    11/29/2023    5:03 PM 11/13/2023    2:21 PM 11/12/2023    4:00 PM  Readmission Risk Prevention Plan  Transportation Screening Not Complete  Not Complete  Medication Review Oceanographer) Not Complete Complete Not Complete  PCP or Specialist appointment within 3-5 days of discharge Not Complete Complete Complete  HRI or Home Care Consult Not Complete Complete   SW Recovery Care/Counseling Consult Not Complete Complete   Palliative Care  Screening Not Applicable Complete   Skilled Nursing Facility Not Applicable Not Applicable

## 2023-12-01 NOTE — Assessment & Plan Note (Signed)
Resolved. -Monitor renal function -Avoid nephrotoxins 

## 2023-12-02 DIAGNOSIS — R531 Weakness: Secondary | ICD-10-CM | POA: Diagnosis not present

## 2023-12-02 DIAGNOSIS — K7682 Hepatic encephalopathy: Secondary | ICD-10-CM | POA: Diagnosis not present

## 2023-12-02 DIAGNOSIS — Z8619 Personal history of other infectious and parasitic diseases: Secondary | ICD-10-CM | POA: Diagnosis not present

## 2023-12-02 DIAGNOSIS — Z515 Encounter for palliative care: Secondary | ICD-10-CM | POA: Diagnosis not present

## 2023-12-02 LAB — GLUCOSE, CAPILLARY
Glucose-Capillary: 67 mg/dL — ABNORMAL LOW (ref 70–99)
Glucose-Capillary: 78 mg/dL (ref 70–99)
Glucose-Capillary: 111 mg/dL — ABNORMAL HIGH (ref 70–99)
Glucose-Capillary: 121 mg/dL — ABNORMAL HIGH (ref 70–99)
Glucose-Capillary: 144 mg/dL — ABNORMAL HIGH (ref 70–99)

## 2023-12-02 LAB — AMMONIA: Ammonia: 69 umol/L — ABNORMAL HIGH (ref 9–35)

## 2023-12-02 LAB — COMPREHENSIVE METABOLIC PANEL WITH GFR
ALT: 25 U/L (ref 0–44)
AST: 35 U/L (ref 15–41)
Albumin: 2.3 g/dL — ABNORMAL LOW (ref 3.5–5.0)
Alkaline Phosphatase: 49 U/L (ref 38–126)
Anion gap: 6 (ref 5–15)
BUN: 18 mg/dL (ref 8–23)
CO2: 24 mmol/L (ref 22–32)
Calcium: 9.5 mg/dL (ref 8.9–10.3)
Chloride: 106 mmol/L (ref 98–111)
Creatinine, Ser: 1.04 mg/dL (ref 0.61–1.24)
GFR, Estimated: >60 mL/min (ref >60)
Glucose, Bld: 73 mg/dL (ref 70–99)
Potassium: 3.5 mmol/L (ref 3.5–5.1)
Sodium: 136 mmol/L (ref 135–145)
Total Bilirubin: 1.6 mg/dL — ABNORMAL HIGH (ref 0.0–1.2)
Total Protein: 5.7 g/dL — ABNORMAL LOW (ref 6.5–8.1)

## 2023-12-02 LAB — CBC
HCT: 28.7 % — ABNORMAL LOW (ref 39.0–52.0)
Hemoglobin: 10.1 g/dL — ABNORMAL LOW (ref 13.0–17.0)
MCH: 32.4 pg (ref 26.0–34.0)
MCHC: 35.2 g/dL (ref 30.0–36.0)
MCV: 92 fL (ref 80.0–100.0)
Platelets: 73 10*3/uL — ABNORMAL LOW (ref 150–400)
RBC: 3.12 MIL/uL — ABNORMAL LOW (ref 4.22–5.81)
RDW: 15.6 % — ABNORMAL HIGH (ref 11.5–15.5)
WBC: 3.2 10*3/uL — ABNORMAL LOW (ref 4.0–10.5)
nRBC: 0 % (ref 0.0–0.2)

## 2023-12-02 LAB — URINE CULTURE: Culture: >=100000 — AB

## 2023-12-02 LAB — PROTIME-INR
INR: 1.3 — ABNORMAL HIGH (ref 0.8–1.2)
Prothrombin Time: 16 s — ABNORMAL HIGH (ref 11.4–15.2)

## 2023-12-02 MED ORDER — ENSURE ENLIVE PO LIQD
237.0000 mL | Freq: Two times a day (BID) | ORAL | Status: DC
  Administered 2023-12-02 – 2023-12-04 (×6): 237 mL via ORAL

## 2023-12-02 NOTE — TOC Progression Note (Signed)
 Transition of Care [facility]) - Progression Note    Patient Details  Name: [name] MRN: [id number] Date of Birth: [birth year]  [facility]) CM/SW Contact  Margarito Liner, LCSW Phone Number: 12/02/2023, 2:34 PM  Clinical Narrative:   CSW sent patient's information to [facility] APS supervisor in a secure email to start guardianship screening.  Expected Discharge Plan and Services                                               Social Determinants of Health (SDOH) Interventions SDOH Screenings   Food Insecurity: No Food Insecurity (11/29/2023)  Housing: Low Risk  (11/29/2023)  Transportation Needs: No Transportation Needs (11/29/2023)  Utilities: Not At Risk (11/29/2023)  Alcohol Screen: Low Risk  (09/24/2022)  Depression (PHQ2-9): High Risk (04/26/2022)  Financial Resource Strain: High Risk (04/27/2022)  Physical Activity: Sufficiently Active (04/16/2018)  Social Connections: Socially Isolated (11/29/2023)  Stress: Stress Concern Present (04/16/2018)  Tobacco Use: High Risk (11/29/2023)    Readmission Risk Interventions    11/29/2023    5:03 PM 11/13/2023    2:21 PM 11/12/2023    4:00 PM  Readmission Risk Prevention Plan  Transportation Screening Not Complete  Not Complete  Medication Review Oceanographer) Not Complete Complete Not Complete  PCP or Specialist appointment within 3-5 days of discharge Not Complete Complete Complete  HRI or Home Care Consult Not Complete Complete   SW Recovery Care/Counseling Consult Not Complete Complete   Palliative Care Screening Not Applicable Complete   Skilled Nursing Facility Not Applicable Not Applicable

## 2023-12-02 NOTE — Progress Notes (Signed)
 Progress Note   Patient: [name] UJW:[id number] DOB: April 08, 1951 DOA: 11/29/2023     3 DOS: the patient was seen and examined on 12/02/2023   Brief hospital course: Mr. Marteze Vecchio is a 73 year old male with history of hepatic encephalopathy, liver cirrhosis, history of hepatitis C, hypertension, COPD, hyperlipidemia, non-insulin-dependent diabetes mellitus, depression, who presents from care home for chief concerns of altered mentation.  Vitals in the ED showed T of 98.1, rr 16, hr 48, blood pressure 152/75, SpO2 100% on room air.  Serum sodium is 140, potassium 3.8, chloride 110, bicarb 23, BUN 30, serum creatinine 1.55, EGFR 1.08, nonfasting blood glucose 114, WBC 3.4, hemoglobin 12.0, platelets of 84.  Ammonia level was elevated at 174.  CT head wo contrast: Was read as no acute intracranial abnormality.  ED treatment: Lactulose 30 g p.o. one-time dose, LR 1 L bolus.  4/5: Vitals with mild bradycardia, labs with improving creatinine to 1.36 and rest seems stable, urine cultures pending.  Oriented to self only, did not had any bowel movement with p.o. lactulose-also added enema. Patient also has a chronic Foley in place. Multiple hospitalization with concern of hepatic encephalopathy over the past 2 months-palliative care was also consulted. Ordered PT and OT evaluation as his prior group home might not take him back.  4/6: Persistent bradycardia which seems chronic, EKG was repeated today and shows chronic junctional rhythm, no acute abnormality.  Remained asymptomatic.  Patient need POA/guardianship.  Slight worsening of INR and T. bili-patient is high risk for mortality based on significant underlying comorbidities and advanced liver cirrhosis.  Having bowel movements now. Oriented to self only and still appears lethargic.  4/7: Hemodynamically stable.  Improving T. bili and INR.  Ammonia improved to 69.  Remains oriented to self only but talking more appropriately.  PT is  recommending SNF-will be a difficult placement as he is a registered sex offender.  Assessment and Plan: * Hepatic encephalopathy (HCC) CT head without any acute abnormality and significantly elevated ammonia which is consistent with hepatic encephalopathy.  Mentation seems improving with improving ammonia level. Home lactulose resumed on admission Started having bowel movements. Rifaximin 550 mg p.o. recent on admission Started on diet  Aspiration, fall precaution  Diabetes mellitus type 2, noninsulin dependent (HCC) CBG with a lower goal but he was also NPO. Insulin SSI with at bedtime coverage, very sensitive dosing ordered  Hx of transient ischemic attack (TIA) Home aspirin and atorvastatin resumed on admission  AKI (acute kidney injury) (HCC) Resolved - Monitor renal function -Avoid nephrotoxins  Sinus bradycardia Asymptomatic and seems chronic.  Repeat EKG was obtained and it shows chronic junctional rhythm and no acute changes Heart rate in 60s today -Continue to monitor -Avoid nodal blocking agents  Hyperlipidemia Home atorvastatin 40 mg daily resume for 4/5  HTN (hypertension) Home losartan 50 mg daily, spironolactone 25 mg daily, furosemide 20 mg daily were resumed on admission  Thrombocytopenia (HCC) At baseline   Subjective: Patient was seen and examined today.  Eating well and having bowel movements now.  Remains oriented to self only  Physical Exam: Vitals:   12/01/23 1613 12/01/23 2240 12/02/23 0545 12/02/23 0809  BP: 108/69 112/63 (!) 104/51 119/60  Pulse: (!) 47 (!) 51 (!) 47 64  Resp: 16   16  Temp: 98.2 F (36.8 C) 98.5 F (36.9 C) 98.2 F (36.8 C) 98 F (36.7 C)  TempSrc:  Oral Oral Oral  SpO2: 100% 100% 100% 100%  Weight:  Height:       General.  Frail elderly man, in no acute distress. Pulmonary.  Lungs clear bilaterally, normal respiratory effort. CV.  Regular rate and rhythm, no JVD, rub or murmur. Abdomen.  Soft, nontender,  nondistended, BS positive. CNS.  Alert and oriented .  No focal neurologic deficit. Extremities.  No edema, no cyanosis, pulses intact and symmetrical.   Data Reviewed: Prior data reviewed  Family Communication: TOC is communicating with his facility.  Need guardianship.  Disposition: Status is: Inpatient Remains inpatient appropriate because: Severity of illness  Planned Discharge Destination: Home  DVT prophylaxis.  Subcu heparin Time spent: 44 minutes  This record has been created using Conservation officer, historic buildings. Errors have been sought and corrected,but may not always be located. Such creation errors do not reflect on the standard of care.   Author: Arnetha Courser, MD 12/02/2023 2:12 PM  For on call review [email or web address].

## 2023-12-02 NOTE — Progress Notes (Signed)
 Physical Therapy Treatment Patient Details Name: [name] MRN: [id number] DOB: Feb 14, 1951 Today's Date: 12/02/2023   History of Present Illness Pt is a 73 y/o male admitted secondary to AMS. Pt found to have Hepatic encephalopathy. PMH including but not limited to hepatitis C, liver cirrhosis, hepatic encephalopathy, HTN, COPD, CHF, T2DM and depression.    PT Comments  Pt received supine asleep. Easily awoken to voice and agreeable to PT session. Pt with improved command following today with less physical assistance. Is able to perform bed mobility at supervision level, stand at [facility] to RW with VC's for hand placement and completing 120' of gait at supervision level and chair follow for safety. PRN minA on RW and multimodal cuing for RW navigation and turning for obstacle navigation and L veering into wall with fair carryover before sitting in recliner.  Pt making excellent progress in mobility but pt remains a falls risk due to poor insight into RW use, environment lending to need for continued 24/7 assist for reducing falls risk. D/c recs remain appropriate.     If plan is discharge home, recommend the following: A lot of help with walking and/or transfers;A lot of help with bathing/dressing/bathroom;Assistance with cooking/housework;Supervision due to cognitive status;Help with stairs or ramp for entrance;Assist for transportation   Can travel by private vehicle     No  Equipment Recommendations  Other (comment) (TBD by next venue of care)    Recommendations for Other Services       Precautions / Restrictions Precautions Precautions: Fall Restrictions Weight Bearing Restrictions Per Provider Order: No     Mobility  Bed Mobility Overal bed mobility: Needs Assistance Bed Mobility: Supine to Sit     Supine to sit: Supervision, Used rails       Patient Response: Cooperative  Transfers Overall transfer level: Needs assistance Equipment used: Rolling walker (2  wheels) Transfers: Sit to/from Stand Sit to Stand: Contact guard assist           General transfer comment: VC's for hand placement.    Ambulation/Gait Ambulation/Gait assistance: Supervision Gait Distance (Feet): 120 Feet Assistive device: Rolling walker (2 wheels) (chair follow for safety) Gait Pattern/deviations: Step-through pattern       General Gait Details: Improved ability to complete gait likely due to greater ability to follow commands today. MinA on RW for avoiding obstacles in hallway.   Stairs             Wheelchair Mobility     Tilt Bed Tilt Bed Patient Response: Cooperative  Modified Rankin (Stroke Patients Only)       Balance Overall balance assessment: Needs assistance Sitting-balance support: Feet supported Sitting balance-Leahy Scale: Good       Standing balance-Leahy Scale: Fair                              Hotel manager: Impaired Factors Affecting Communication: Hearing impaired  Cognition Arousal: Alert Behavior During Therapy: WFL for tasks assessed/performed   PT - Cognitive impairments: History of cognitive impairments                         Following commands: Intact Following commands impaired: Follows one step commands with increased time    Cueing Cueing Techniques: Verbal cues, Gestural cues, Tactile cues  Exercises      General Comments        Pertinent Vitals/Pain Pain Assessment Pain Assessment: Faces  Faces Pain Scale: No hurt    Home Living                          Prior Function            PT Goals (current goals can now be found in the care plan section) Acute Rehab PT Goals Patient Stated Goal: return to his group home PT Goal Formulation: With patient Time For Goal Achievement: 12/15/23 Potential to Achieve Goals: Good Progress towards PT goals: Progressing toward goals    Frequency    Min 3X/week      PT Plan       Co-evaluation              AM-PAC PT "6 Clicks" Mobility   Outcome Measure  Help needed turning from your back to your side while in a flat bed without using bedrails?: A Little Help needed moving from lying on your back to sitting on the side of a flat bed without using bedrails?: A Little Help needed moving to and from a bed to a chair (including a wheelchair)?: A Little Help needed standing up from a chair using your arms (e.g., wheelchair or bedside chair)?: A Little Help needed to walk in hospital room?: A Little Help needed climbing 3-5 steps with a railing? : A Lot 6 Click Score: 17    End of Session Equipment Utilized During Treatment: Gait belt Activity Tolerance: Patient tolerated treatment well Patient left: in chair;with call bell/phone within reach;with chair alarm set Nurse Communication: Mobility status PT Visit Diagnosis: Other abnormalities of gait and mobility (R26.89)     Time: 9518-8416 PT Time Calculation (min) (ACUTE ONLY): 20 min  Charges:    $Therapeutic Activity: 8-22 mins PT General Charges $$ ACUTE PT VISIT: 1 Visit                     Delphia Grates. Fairly IV, PT, DPT Physical Therapist- [place]  [facility]  12/02/2023, 1:01 PM

## 2023-12-02 NOTE — Plan of Care (Signed)
  Problem: Pain Managment: Goal: General experience of comfort will improve and/or be controlled Outcome: Progressing   Problem: Safety: Goal: Ability to remain free from injury will improve Outcome: Progressing

## 2023-12-02 NOTE — Care Management Important Message (Signed)
 Important Message  Patient Details  Name: [name] MRN: [id number] Date of Birth: October 27, 1950   Important Message Given:  Yes - Medicare IM     Ines Rebel W, CMA 12/02/2023, 10:30 AM

## 2023-12-02 NOTE — Progress Notes (Signed)
 Patient well-known to nurse navigator from previous admissions. Patient has had MULTIPLE re-admissions in the last 6 months. Patient currently resides in a group home.  On arrival to patient room, patient noted to be lying supine in bed. Patient greeted nurse navigator. Although unable to remember navigator's name, patient did seem to recognize navigator.   Patient alert to self and place, identifies year as 2005. When correct year provided, patient states "Oh yea, that's right. I would've been in prison then." Which would have been correct.  Patient states he is "feeling good." When asked about why he was back in the hospital, patient states "That woman that works where I live just gets crazy and freaks out about everything. She always calls 911. Like if I fall or something."  Patient states there are 6 people living in the group home and that it is a "real nice place." When asked who gives him his medication at the group home, patient states "That lady I told you about."   Patient then mentions something about being in the war. To which nurse navigator states, "That was a long time ago." Patient replies (slightly tearful), "Yes, but it always says with you."  Patient requests for navigator to ask about him getting a wheelchair so he does "fall and get sent back here"

## 2023-12-02 NOTE — Progress Notes (Signed)
 Previously seen by PMT as seen in note dated 12/01/23.  Spoke in person with Alfonso Patten, RN, about guardianship for this patient.   Tiffany advises that she has send request via email to DSS to initiate the process for guardianship. She adds that she has left VM for [name] at the group home as well in regards to the same.   Plan and goals are clear.  No acute palliative care needs identified at this time.     Alex Gardener, Juel Burrow- [facility] Palliative Medicine Team  12/02/2023 11:06 AM  Office [phone]  Pager [phone]  NO CHARGE NOTE

## 2023-12-03 ENCOUNTER — Ambulatory Visit: Attending: Cardiology | Admitting: Cardiology

## 2023-12-03 DIAGNOSIS — Z8619 Personal history of other infectious and parasitic diseases: Secondary | ICD-10-CM | POA: Diagnosis not present

## 2023-12-03 DIAGNOSIS — K7682 Hepatic encephalopathy: Secondary | ICD-10-CM | POA: Diagnosis not present

## 2023-12-03 DIAGNOSIS — Z515 Encounter for palliative care: Secondary | ICD-10-CM | POA: Diagnosis not present

## 2023-12-03 DIAGNOSIS — R531 Weakness: Secondary | ICD-10-CM | POA: Diagnosis not present

## 2023-12-03 LAB — GLUCOSE, CAPILLARY
Glucose-Capillary: 73 mg/dL (ref 70–99)
Glucose-Capillary: 110 mg/dL — ABNORMAL HIGH (ref 70–99)
Glucose-Capillary: 114 mg/dL — ABNORMAL HIGH (ref 70–99)
Glucose-Capillary: 109 mg/dL — ABNORMAL HIGH (ref 70–99)

## 2023-12-03 MED ORDER — CEPHALEXIN 500 MG PO CAPS
500.0000 mg | ORAL_CAPSULE | Freq: Four times a day (QID) | ORAL | Status: DC
  Administered 2023-12-03 – 2023-12-05 (×8): 500 mg via ORAL
  Filled 2023-12-03 (×8): qty 1

## 2023-12-03 NOTE — Assessment & Plan Note (Signed)
 Urine cultures which were obtained on admission now with Klebsiella pneumoniae, shows only resistance to ampicillin. Patient has a chronic Foley in place. Asking nurse to replace Foley. Start him on Keflex although unable to explain any urinary symptoms-but he is high risk.

## 2023-12-03 NOTE — Plan of Care (Signed)
 Foley Catheter is patent and with yellow urine.

## 2023-12-03 NOTE — Progress Notes (Addendum)
 Progress Note   Patient: [name] ZOX:[id number] DOB: [birth year] DOA: 11/29/2023     4 DOS: the patient was seen and examined on 12/03/2023   Brief hospital course: [name] is a 73 year old male with history of hepatic encephalopathy, liver cirrhosis, history of hepatitis C, hypertension, COPD, hyperlipidemia, non-insulin-dependent diabetes mellitus, depression, who presents from care home for chief concerns of altered mentation.  Vitals in the ED showed T of 98.1, rr 16, hr 48, blood pressure 152/75, SpO2 100% on room air.  Serum sodium is 140, potassium 3.8, chloride 110, bicarb 23, BUN 30, serum creatinine 1.55, EGFR 1.08, nonfasting blood glucose 114, WBC 3.4, hemoglobin 12.0, platelets of 84.  Ammonia level was elevated at 174.  CT head wo contrast: Was read as no acute intracranial abnormality.  ED treatment: Lactulose 30 g p.o. one-time dose, LR 1 L bolus.  4/5: Vitals with mild bradycardia, labs with improving creatinine to 1.36 and rest seems stable, urine cultures pending.  Oriented to self only, did not had any bowel movement with p.o. lactulose-also added enema. Patient also has a chronic Foley in place. Multiple hospitalization with concern of hepatic encephalopathy over the past 2 months-palliative care was also consulted. Ordered PT and OT evaluation as his prior group home might not take him back.  4/6: Persistent bradycardia which seems chronic, EKG was repeated today and shows chronic junctional rhythm, no acute abnormality.  Remained asymptomatic.  Patient need POA/guardianship.  Slight worsening of INR and T. bili-patient is high risk for mortality based on significant underlying comorbidities and advanced liver cirrhosis.  Having bowel movements now. Oriented to self only and still appears lethargic.  4/7: Hemodynamically stable.  Improving T. bili and INR.  Ammonia improved to 69.  Remains oriented to self only but talking more appropriately.  PT is  recommending SNF-will be a difficult placement as he is a registered sex offender.  4/8: Remains stable and at baseline.  Having 1-2 daily bowel movements.   Urine cultures with pretty good sensitivity Klebsiella pneumonia, patient has a chronic Foley in place, no fever or leukocytosis, decided to treat with Keflex due to increased risk. Awaiting placement.  He also need guardianship-so likely will stay in hospital for a long time.   Assessment and Plan: * Hepatic encephalopathy (HCC) CT head without any acute abnormality and significantly elevated ammonia which is consistent with hepatic encephalopathy.  Mentation seems improving with improving ammonia level. Home lactulose resumed on admission Started having bowel movements. Rifaximin 550 mg p.o. recent on admission Started on diet  Aspiration, fall precaution  Diabetes mellitus type 2, noninsulin dependent (HCC) CBG with a lower goal but he was also NPO. Insulin SSI with at bedtime coverage, very sensitive dosing ordered  UTI (urinary tract infection) Urine cultures which were obtained on admission now with Klebsiella pneumoniae, shows only resistance to ampicillin. Patient has a chronic Foley in place. Asking nurse to replace Foley. Start him on Keflex although unable to explain any urinary symptoms-but he is high risk.  Hx of transient ischemic attack (TIA) Home aspirin and atorvastatin resumed on admission  AKI (acute kidney injury) (HCC) Resolved - Monitor renal function -Avoid nephrotoxins  Sinus bradycardia Asymptomatic and seems chronic.  Repeat EKG was obtained and it shows chronic junctional rhythm and no acute changes Heart rate in 60s today -Continue to monitor -Avoid nodal blocking agents  Hyperlipidemia Home atorvastatin 40 mg daily resume for 4/5  HTN (hypertension) Home losartan 50 mg daily, spironolactone 25 mg  daily, furosemide 20 mg daily were resumed on admission  Thrombocytopenia (HCC) At  baseline   Subjective: Patient was sitting comfortably in chair when seen today.  Oriented to self and place today.  No new concern.  Physical Exam: Vitals:   12/02/23 1941 12/02/23 2100 12/03/23 0504 12/03/23 0908  BP: (!) 114/58 111/70 (!) 101/54 115/60  Pulse: (!) 45 (!) 47 (!) 50 (!) 48  Resp:    15  Temp: 97.9 F (36.6 C)  98.5 F (36.9 C)   TempSrc: Oral  Oral   SpO2: 100%  100% 98%  Weight:      Height:       General.  Frail elderly man, in no acute distress. Pulmonary.  Lungs clear bilaterally, normal respiratory effort. CV.  Regular rate and rhythm, no JVD, rub or murmur. Abdomen.  Soft, nontender, nondistended, BS positive. CNS.  Alert and oriented x 2.  No focal neurologic deficit. Extremities.  No edema, no cyanosis, pulses intact and symmetrical.  Data Reviewed: Prior data reviewed  Family Communication: TOC is communicating with his facility.  Need guardianship.  Disposition: Status is: Inpatient Remains inpatient appropriate because: Severity of illness  Planned Discharge Destination: Home  DVT prophylaxis.  Subcu heparin Time spent: 43 minutes  This record has been created using Conservation officer, historic buildings. Errors have been sought and corrected,but may not always be located. Such creation errors do not reflect on the standard of care.   Author: Arnetha Courser, MD 12/03/2023 3:04 PM  For on call review [email or web address].

## 2023-12-03 NOTE — Progress Notes (Signed)
 Rounded on patient. Patient OOB to chair with feet propped up on bed. Patient again alert to self and place. Patient is able to tell me it is almost [place] and it is April. When asked the year, he says he isn't sure but it "might be 2021."   Patient able to verbalize understanding of the need to take lactulose, although he "doesn't like it."   Patient asks for ginger ale. Able to hold cup and drink from straw with only verbal cues.   Will continue to follow.

## 2023-12-04 DIAGNOSIS — R531 Weakness: Secondary | ICD-10-CM | POA: Diagnosis not present

## 2023-12-04 DIAGNOSIS — Z515 Encounter for palliative care: Secondary | ICD-10-CM | POA: Diagnosis not present

## 2023-12-04 DIAGNOSIS — K7682 Hepatic encephalopathy: Secondary | ICD-10-CM | POA: Diagnosis not present

## 2023-12-04 DIAGNOSIS — Z8619 Personal history of other infectious and parasitic diseases: Secondary | ICD-10-CM | POA: Diagnosis not present

## 2023-12-04 LAB — GLUCOSE, CAPILLARY
Glucose-Capillary: 77 mg/dL (ref 70–99)
Glucose-Capillary: 117 mg/dL — ABNORMAL HIGH (ref 70–99)

## 2023-12-04 NOTE — Plan of Care (Signed)
  Problem: Metabolic: Goal: Ability to maintain appropriate glucose levels will improve Outcome: Progressing   Problem: Nutritional: Goal: Maintenance of adequate nutrition will improve Outcome: Progressing   Problem: Skin Integrity: Goal: Risk for impaired skin integrity will decrease Outcome: Progressing   Problem: Tissue Perfusion: Goal: Adequacy of tissue perfusion will improve Outcome: Progressing   Problem: Clinical Measurements: Goal: Ability to maintain clinical measurements within normal limits will improve Outcome: Progressing Goal: Will remain free from infection Outcome: Progressing Goal: Diagnostic test results will improve Outcome: Progressing Goal: Respiratory complications will improve Outcome: Progressing Goal: Cardiovascular complication will be avoided Outcome: Progressing   Problem: Activity: Goal: Risk for activity intolerance will decrease Outcome: Progressing   Problem: Nutrition: Goal: Adequate nutrition will be maintained Outcome: Progressing   Problem: Coping: Goal: Level of anxiety will decrease Outcome: Progressing   Problem: Elimination: Goal: Will not experience complications related to bowel motility Outcome: Progressing Goal: Will not experience complications related to urinary retention Outcome: Progressing   Problem: Pain Managment: Goal: General experience of comfort will improve and/or be controlled Outcome: Progressing   Problem: Safety: Goal: Ability to remain free from injury will improve Outcome: Progressing   Problem: Skin Integrity: Goal: Risk for impaired skin integrity will decrease Outcome: Progressing

## 2023-12-04 NOTE — Progress Notes (Signed)
 Progress Note   Patient: [name] ZOX:[id number] DOB: 1950-08-31 DOA: 11/29/2023     5 DOS: the patient was seen and examined on 12/04/2023   Brief hospital course: Mr. Chanceler Pullin is a 73 year old male with history of hepatic encephalopathy, liver cirrhosis, history of hepatitis C, hypertension, COPD, hyperlipidemia, non-insulin-dependent diabetes mellitus, depression, who presents from care home for chief concerns of altered mentation.  Vitals in the ED showed T of 98.1, rr 16, hr 48, blood pressure 152/75, SpO2 100% on room air.  Serum sodium is 140, potassium 3.8, chloride 110, bicarb 23, BUN 30, serum creatinine 1.55, EGFR 1.08, nonfasting blood glucose 114, WBC 3.4, hemoglobin 12.0, platelets of 84.  Ammonia level was elevated at 174.  CT head wo contrast: Was read as no acute intracranial abnormality.  ED treatment: Lactulose 30 g p.o. one-time dose, LR 1 L bolus.  4/5: Vitals with mild bradycardia, labs with improving creatinine to 1.36 and rest seems stable, urine cultures pending.  Oriented to self only, did not had any bowel movement with p.o. lactulose-also added enema. Patient also has a chronic Foley in place. Multiple hospitalization with concern of hepatic encephalopathy over the past 2 months-palliative care was also consulted. Ordered PT and OT evaluation as his prior group home might not take him back.  4/6: Persistent bradycardia which seems chronic, EKG was repeated today and shows chronic junctional rhythm, no acute abnormality.  Remained asymptomatic.  Patient need POA/guardianship.  Slight worsening of INR and T. bili-patient is high risk for mortality based on significant underlying comorbidities and advanced liver cirrhosis.  Having bowel movements now. Oriented to self only and still appears lethargic.  4/7: Hemodynamically stable.  Improving T. bili and INR.  Ammonia improved to 69.  Remains oriented to self only but talking more appropriately.  PT is  recommending SNF-will be a difficult placement as he is a registered sex offender.  4/8: Remains stable and at baseline.  Having 1-2 daily bowel movements.   Urine cultures with pretty good sensitivity Klebsiella pneumonia, patient has a chronic Foley in place, no fever or leukocytosis, decided to treat with Keflex due to increased risk. Awaiting placement.  He also need guardianship-so likely will stay in hospital for a long time.  4/9: Hemodynamically stable, improving physical strength so PT changed their recommendations for home health.  TOC to see if his prior group home can take him back.   Assessment and Plan: * Hepatic encephalopathy (HCC) CT head without any acute abnormality and significantly elevated ammonia which is consistent with hepatic encephalopathy.  Mentation seems improving with improving ammonia level. Home lactulose resumed on admission Started having bowel movements. Rifaximin 550 mg p.o. recent on admission Started on diet  Aspiration, fall precaution  Diabetes mellitus type 2, noninsulin dependent (HCC) CBG with a lower goal but he was also NPO. Insulin SSI with at bedtime coverage, very sensitive dosing ordered  UTI (urinary tract infection) Urine cultures which were obtained on admission now with Klebsiella pneumoniae, shows only resistance to ampicillin. Patient has a chronic Foley in place. Asking nurse to replace Foley. Start him on Keflex although unable to explain any urinary symptoms-but he is high risk.  Hx of transient ischemic attack (TIA) Home aspirin and atorvastatin resumed on admission  AKI (acute kidney injury) (HCC) Resolved - Monitor renal function -Avoid nephrotoxins  Sinus bradycardia Asymptomatic and seems chronic.  Repeat EKG was obtained and it shows chronic junctional rhythm and no acute changes Heart rate in 60s today -  Continue to monitor -Avoid nodal blocking agents  Hyperlipidemia Home atorvastatin 40 mg daily resume for  4/5  HTN (hypertension) Home losartan 50 mg daily, spironolactone 25 mg daily, furosemide 20 mg daily were resumed on admission  Thrombocytopenia (HCC) At baseline   Subjective: Patient was lying comfortably when seen today.  No new concern.  Physical Exam: Vitals:   12/03/23 1700 12/03/23 2016 12/04/23 0321 12/04/23 0836  BP:  124/67 (!) 116/57 (!) 107/50  Pulse:  (!) 42 (!) 48 (!) 47  Resp:  15 16 18   Temp: (!) 97.5 F (36.4 C) 98.1 F (36.7 C) 98.1 F (36.7 C) 97.6 F (36.4 C)  TempSrc: Oral Oral Oral   SpO2:  100% 99% 97%  Weight:      Height:       General.  Frail elderly man, in no acute distress. Pulmonary.  Lungs clear bilaterally, normal respiratory effort. CV.  Regular rate and rhythm, no JVD, rub or murmur. Abdomen.  Soft, nontender, nondistended, BS positive. CNS.  Alert and oriented x 2.  No focal neurologic deficit. Extremities.  No edema, no cyanosis, pulses intact and symmetrical.  Data Reviewed: Prior data reviewed  Family Communication: TOC is communicating with his facility and DSS  Disposition: Status is: Inpatient Remains inpatient appropriate because: Severity of illness  Planned Discharge Destination: Home with home health  DVT prophylaxis.  Subcu heparin Time spent: 43 minutes  This record has been created using Conservation officer, historic buildings. Errors have been sought and corrected,but may not always be located. Such creation errors do not reflect on the standard of care.   Author: Arnetha Courser, MD 12/04/2023 2:48 PM  For on call review [email or web address].

## 2023-12-04 NOTE — Plan of Care (Signed)
  Problem: Education: Goal: Ability to describe self-care measures that may prevent or decrease complications (Diabetes Survival Skills Education) will improve Outcome: Not Progressing Goal: Individualized Educational Video(s) Outcome: Not Progressing   Problem: Coping: Goal: Ability to adjust to condition or change in health will improve Outcome: Not Progressing   Problem: Fluid Volume: Goal: Ability to maintain a balanced intake and output will improve Outcome: Progressing   Problem: Health Behavior/Discharge Planning: Goal: Ability to identify and utilize available resources and services will improve Outcome: Not Progressing Goal: Ability to manage health-related needs will improve Outcome: Not Progressing   Problem: Metabolic: Goal: Ability to maintain appropriate glucose levels will improve Outcome: Progressing   Problem: Nutritional: Goal: Maintenance of adequate nutrition will improve Outcome: Not Progressing Goal: Progress toward achieving an optimal weight will improve Outcome: Not Progressing   Problem: Skin Integrity: Goal: Risk for impaired skin integrity will decrease Outcome: Progressing   Problem: Tissue Perfusion: Goal: Adequacy of tissue perfusion will improve Outcome: Progressing   Problem: Education: Goal: Knowledge of General Education information will improve Description: Including pain rating scale, medication(s)/side effects and non-pharmacologic comfort measures Outcome: Not Progressing   Problem: Health Behavior/Discharge Planning: Goal: Ability to manage health-related needs will improve Outcome: Not Progressing   Problem: Clinical Measurements: Goal: Ability to maintain clinical measurements within normal limits will improve Outcome: Progressing Goal: Will remain free from infection Outcome: Progressing Goal: Diagnostic test results will improve Outcome: Progressing Goal: Respiratory complications will improve Outcome:  Progressing Goal: Cardiovascular complication will be avoided Outcome: Progressing   Problem: Activity: Goal: Risk for activity intolerance will decrease Outcome: Progressing   Problem: Nutrition: Goal: Adequate nutrition will be maintained Outcome: Progressing   Problem: Coping: Goal: Level of anxiety will decrease Outcome: Not Progressing   Problem: Elimination: Goal: Will not experience complications related to bowel motility Outcome: Progressing Goal: Will not experience complications related to urinary retention Outcome: Progressing   Problem: Pain Managment: Goal: General experience of comfort will improve and/or be controlled Outcome: Progressing   Problem: Safety: Goal: Ability to remain free from injury will improve Outcome: Progressing   Problem: Skin Integrity: Goal: Risk for impaired skin integrity will decrease Outcome: Progressing

## 2023-12-04 NOTE — Progress Notes (Signed)
 Physical Therapy Treatment Patient Details Name: [name] MRN: [id number] DOB: [birth year] Today's Date: 12/04/2023   History of Present Illness Pt is a 73 y/o male admitted secondary to AMS. Pt found to have Hepatic encephalopathy. PMH including but not limited to hepatitis C, liver cirrhosis, hepatic encephalopathy, HTN, COPD, CHF, T2DM and depression.    PT Comments  Pt received supine in bed agreeable to PT. Performs all mobility at supervision level with improved awareness in  hallway not running into obstacles today. Pt declines further gait distances but reports feeling like he is ambulating and moving at his baseline. Pt returned to bed per request declining sitting in recliner. Seems like pt has prior history leading to barriers being admitted to SNF. Updated recs as appropriate as pt seems back to his baseline. Pt left in care of RN. Medical team updated on changes in recs.    If plan is discharge home, recommend the following: A lot of help with walking and/or transfers;A lot of help with bathing/dressing/bathroom;Assistance with cooking/housework;Supervision due to cognitive status;Help with stairs or ramp for entrance;Assist for transportation   Can travel by private vehicle     Yes  Equipment Recommendations  None recommended by PT    Recommendations for Other Services       Precautions / Restrictions Precautions Precautions: Fall Recall of Precautions/Restrictions: Intact Restrictions Weight Bearing Restrictions Per Provider Order: No     Mobility  Bed Mobility Overal bed mobility: Needs Assistance Bed Mobility: Supine to Sit     Supine to sit: Supervision Sit to supine: Supervision     Patient Response: Cooperative  Transfers Overall transfer level: Needs assistance Equipment used: Rolling walker (2 wheels) Transfers: Sit to/from Stand Sit to Stand: Supervision           General transfer comment: safe hand placement     Ambulation/Gait Ambulation/Gait assistance: Supervision Gait Distance (Feet): 120 Feet Assistive device: Rolling walker (2 wheels) Gait Pattern/deviations: Step-through pattern       General Gait Details: no need for cuing or assist for avoiding obstacles   Stairs             Wheelchair Mobility     Tilt Bed Tilt Bed Patient Response: Cooperative  Modified Rankin (Stroke Patients Only)       Balance Overall balance assessment: Needs assistance Sitting-balance support: Feet supported Sitting balance-Leahy Scale: Good     Standing balance support: During functional activity, Bilateral upper extremity supported Standing balance-Leahy Scale: Fair                              Hotel manager: Impaired Factors Affecting Communication: Hearing impaired  Cognition Arousal: Alert Behavior During Therapy: WFL for tasks assessed/performed   PT - Cognitive impairments: History of cognitive impairments                         Following commands: Intact Following commands impaired: Follows one step commands with increased time    Cueing Cueing Techniques: Verbal cues  Exercises      General Comments        Pertinent Vitals/Pain Pain Assessment Pain Assessment: No/denies pain    Home Living                          Prior Function            PT Goals (  current goals can now be found in the care plan section) Acute Rehab PT Goals Patient Stated Goal: return to his group home PT Goal Formulation: With patient Time For Goal Achievement: 12/15/23 Potential to Achieve Goals: Good Progress towards PT goals: Progressing toward goals    Frequency    Min 3X/week      PT Plan      Co-evaluation              AM-PAC PT "6 Clicks" Mobility   Outcome Measure  Help needed turning from your back to your side while in a flat bed without using bedrails?: A Little Help needed moving from  lying on your back to sitting on the side of a flat bed without using bedrails?: A Little Help needed moving to and from a bed to a chair (including a wheelchair)?: A Little Help needed standing up from a chair using your arms (e.g., wheelchair or bedside chair)?: A Little Help needed to walk in hospital room?: A Little Help needed climbing 3-5 steps with a railing? : A Lot 6 Click Score: 17    End of Session Equipment Utilized During Treatment: Gait belt Activity Tolerance: Patient tolerated treatment well Patient left: in bed;with call bell/phone within reach;with bed alarm set;with nursing/sitter in room Nurse Communication: Mobility status PT Visit Diagnosis: Other abnormalities of gait and mobility (R26.89)     Time: 2956-2130 PT Time Calculation (min) (ACUTE ONLY): 18 min  Charges:    $Therapeutic Activity: 8-22 mins PT General Charges $$ ACUTE PT VISIT: 1 Visit                     Delphia Grates. Fairly IV, PT, DPT Physical Therapist- [place]  [facility]  12/04/2023, 1:21 PM

## 2023-12-04 NOTE — TOC Progression Note (Signed)
 Transition of Care [facility]) - Progression Note    Patient Details  Name: [name] MRN: [id number] Date of Birth: [birth year]  [facility]) CM/SW Contact  Margarito Liner, LCSW Phone Number: 12/04/2023, 12:35 PM  Clinical Narrative:   CSW sent secure email to APS supervisor to confirm patient can return to his group home if they are able to manage his needs.  Expected Discharge Plan and Services                                               Social Determinants of Health (SDOH) Interventions SDOH Screenings   Food Insecurity: No Food Insecurity (11/29/2023)  Housing: Low Risk  (11/29/2023)  Transportation Needs: No Transportation Needs (11/29/2023)  Utilities: Not At Risk (11/29/2023)  Alcohol Screen: Low Risk  (09/24/2022)  Depression (PHQ2-9): High Risk (04/26/2022)  Financial Resource Strain: High Risk (04/27/2022)  Physical Activity: Sufficiently Active (04/16/2018)  Social Connections: Socially Isolated (11/29/2023)  Stress: Stress Concern Present (04/16/2018)  Tobacco Use: High Risk (11/29/2023)    Readmission Risk Interventions    11/29/2023    5:03 PM 11/13/2023    2:21 PM 11/12/2023    4:00 PM  Readmission Risk Prevention Plan  Transportation Screening Not Complete  Not Complete  Medication Review Oceanographer) Not Complete Complete Not Complete  PCP or Specialist appointment within 3-5 days of discharge Not Complete Complete Complete  HRI or Home Care Consult Not Complete Complete   SW Recovery Care/Counseling Consult Not Complete Complete   Palliative Care Screening Not Applicable Complete   Skilled Nursing Facility Not Applicable Not Applicable

## 2023-12-05 ENCOUNTER — Other Ambulatory Visit: Payer: Self-pay

## 2023-12-05 DIAGNOSIS — N3000 Acute cystitis without hematuria: Secondary | ICD-10-CM | POA: Diagnosis not present

## 2023-12-05 DIAGNOSIS — K7682 Hepatic encephalopathy: Secondary | ICD-10-CM | POA: Diagnosis not present

## 2023-12-05 DIAGNOSIS — E119 Type 2 diabetes mellitus without complications: Secondary | ICD-10-CM | POA: Diagnosis not present

## 2023-12-05 DIAGNOSIS — R4182 Altered mental status, unspecified: Secondary | ICD-10-CM | POA: Diagnosis not present

## 2023-12-05 LAB — GLUCOSE, CAPILLARY: Glucose-Capillary: 90 mg/dL (ref 70–99)

## 2023-12-05 MED ORDER — CEPHALEXIN 500 MG PO CAPS
500.0000 mg | ORAL_CAPSULE | Freq: Four times a day (QID) | ORAL | 0 refills | Status: AC
  Filled 2023-12-05: qty 20, 5d supply, fill #0

## 2023-12-05 MED ORDER — LACTULOSE 10 GM/15ML PO SOLN
30.0000 g | Freq: Three times a day (TID) | ORAL | 10 refills | Status: DC
  Filled 2023-12-05: qty 946, 7d supply, fill #0

## 2023-12-05 NOTE — [place] FL2 (Signed)
 [place] MEDICAID FL2 LEVEL OF CARE FORM     IDENTIFICATION  Patient Name: [name] Birthdate: 01/18/1951 Sex: male Admission Date (Current Location): 11/29/2023  [place] and [place] Number:  Chiropodist and Address:  [facility], [address] [place], [place] [address]      Provider Number: [id number]  Attending Physician Name and Address:  Arnetha Courser, MD  Relative Name and Phone Number:       Current Level of Care: Hospital Recommended Level of Care: [facility] (with PT, OT, RN through [facility]) Prior Approval Number:    Date Approved/Denied:   PASRR Number: [id number]  Discharge Plan: Other (Comment) ([facility] with PT, OT, RN through [employer])    Current Diagnoses: Patient Active Problem List   Diagnosis Date Noted   Altered mental status 11/12/2023   Type II diabetes mellitus with renal manifestations (HCC) 10/21/2023   Chronic kidney disease, stage 3a (HCC) 10/21/2023   Myocardial injury 10/21/2023   CKD stage 3a, GFR 45-59 ml/min (HCC) - baseline scr 1.3 10/01/2023   Hepatic encephalopathy (HCC) 09/30/2023   Liver cirrhosis (HCC) 09/30/2023   Essential hypertension 09/30/2023   Diabetes mellitus type 2, noninsulin dependent (HCC) 09/30/2023   CAD (coronary artery disease) 09/30/2023   Hyperlipidemia 09/30/2023   Hepatic encephalopathy (HCC) 08/01/2023   Hyperammonemia (HCC) 07/30/2023   Cervical spinal stenosis 07/01/2023   Cervical myelopathy (HCC) 06/21/2023   Cervical stenosis of spine 06/21/2023   Cervical radiculopathy 06/20/2023   E coli bacteremia 06/17/2023   UTI (urinary tract infection) 06/17/2023   Left leg cellulitis 03/23/2023   Leukocytosis 03/23/2023   Serum total bilirubin elevated 03/23/2023   Left leg pain 03/23/2023   Suicidal ideation 01/14/2023   Acute pain 01/14/2023   Adrenal insufficiency (HCC) 12/26/2022   Orthostatic hypotension 12/24/2022   Rib pain on right side 12/24/2022    Hepatitis B infection 12/24/2022   Myocardial ischemia 12/24/2022   Acute appendicitis 11/05/2022   Pancytopenia (HCC) 11/05/2022   Sinus bradycardia 11/05/2022   Acute metabolic encephalopathy 09/22/2022   Liver lesion 09/22/2022   Type 2 diabetes mellitus with hyperlipidemia (HCC) 09/22/2022   Obesity (BMI 30-39.9) 09/22/2022   Acute hepatic encephalopathy (HCC) 09/22/2022   MDD (major depressive disorder), recurrent severe, 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   Major depressive disorder, 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 psychotic features (HCC) 09/15/2022   Protein-calorie malnutrition, severe (HCC) 08/31/2022   Hypotension 08/31/2022   Weakness    Hx of transient ischemic attack (TIA) 04/28/2022   Obesity    Chronic diastolic CHF (congestive heart failure) (HCC)    Depression    Sepsis (HCC) 03/28/2022   Bacteremia due to Klebsiella pneumoniae 03/28/2022   Hypoglycemia 03/28/2022   Syncope 03/24/2022   Hypokalemia 03/24/2022   Fever 03/24/2022   COPD (chronic obstructive pulmonary disease) (HCC)    Elevated troponin    Abnormal LFTs    History of hepatitis C    AKI (acute kidney injury) (HCC)    Pure hypercholesterolemia    Obesity, Class III, BMI 40-49.9 (morbid obesity) (HCC) 04/18/2020   Thrombocytopenia (HCC) 04/17/2020   Leukopenia 04/17/2020   HTN (hypertension) 04/17/2018   Uncontrolled type 2 diabetes mellitus with hyperglycemia, without long-term current use of insulin (HCC) 04/17/2018   Lymphedema 04/17/2018   Normocytic anemia 04/07/2018   Chest pain 03/20/2018   Unsheltered homelessness 12/16/2017    Orientation RESPIRATION BLADDER Height & Weight     Self  Normal Continent, Indwelling catheter Weight: 179 lb  7.3 oz (81.4 kg) Height:  5\' 6"  (167.6 cm)  BEHAVIORAL SYMPTOMS/MOOD NEUROLOGICAL BOWEL NUTRITION STATUS   (None)  (None) Continent Diet (Low sodium, heart healthy)  AMBULATORY STATUS COMMUNICATION OF NEEDS Skin   Supervision Verbally Bruising, Other  (Comment) (Erythema/redness.)                       Personal Care Assistance Level of Assistance  Bathing, Feeding, Dressing Bathing Assistance: Limited assistance Feeding assistance: Limited assistance Dressing Assistance: Limited assistance     Functional Limitations Info  Sight, Hearing, Speech Sight Info: Adequate Hearing Info: Adequate Speech Info: Adequate    SPECIAL CARE FACTORS FREQUENCY  PT (By licensed PT), OT (By licensed OT)     PT Frequency: 3 x week OT Frequency: 3 x week            Contractures Contractures Info: Not present    Additional Factors Info  Code Status, Allergies Code Status Info: DNR Allergies Info: Bee Venom, Codeine, Rsv Mrna Pre-f Virus Vaccine, Meloxicam, Codeine, Novocain (Procaine), Sulfa Antibiotics, Sulfa Antibiotics           Current Medications (12/05/2023):  This is the current hospital active medication list Current Facility-Administered Medications  Medication Dose Route Frequency Provider Last Rate Last Admin   albuterol (PROVENTIL) (2.5 MG/3ML) 0.083% nebulizer solution 2.5 mg  2.5 mg Nebulization Q4H PRN Cox, Amy N, DO       aspirin chewable tablet 81 mg  81 mg Oral Daily Cox, Amy N, DO   81 mg at 12/05/23 1054   atorvastatin (LIPITOR) tablet 40 mg  40 mg Oral QHS Cox, Amy N, DO   40 mg at 12/04/23 2253   cephALEXin (KEFLEX) capsule 500 mg  500 mg Oral Q6H Arnetha Courser, MD   500 mg at 12/05/23 1054   Chlorhexidine Gluconate Cloth 2 % PADS 6 each  6 each Topical Daily Arnetha Courser, MD   6 each at 12/05/23 0900   feeding supplement (ENSURE ENLIVE / ENSURE PLUS) liquid 237 mL  237 mL Oral BID BM Arnetha Courser, MD   Given at 12/05/23 1053   furosemide (LASIX) tablet 20 mg  20 mg Oral Daily Cox, Amy N, DO   20 mg at 12/05/23 1054   heparin injection 5,000 Units  5,000 Units Subcutaneous Q8H Cox, Amy N, DO   5,000 Units at 12/05/23 7564   lactulose (CHRONULAC) 10 GM/15ML solution 30 g  30 g Oral TID Arnetha Courser, MD   30 g  at 12/05/23 1052   multivitamin with minerals tablet 1 tablet  1 tablet Oral Daily Cox, Amy N, DO   1 tablet at 12/05/23 1054   nitroGLYCERIN (NITROSTAT) SL tablet 0.4 mg  0.4 mg Sublingual Q5 min PRN Orson Aloe, RPH       pantoprazole (PROTONIX) EC tablet 40 mg  40 mg Oral Daily Cox, Amy N, DO   40 mg at 12/05/23 1054   rifaximin (XIFAXAN) tablet 550 mg  550 mg Oral BID Cox, Amy N, DO   550 mg at 12/05/23 1054   senna (SENOKOT) tablet 8.6 mg  1 tablet Oral BID PRN Cox, Amy N, DO       spironolactone (ALDACTONE) tablet 25 mg  25 mg Oral Daily Cox, Amy N, DO   25 mg at 12/05/23 1054     Discharge Medications: STOP taking these medications     losartan 50 MG tablet Commonly known as: COZAAR  TAKE these medications     acetaminophen 650 MG CR tablet Commonly known as: TYLENOL Take 650 mg by mouth every 8 (eight) hours as needed for pain.    aspirin 81 MG chewable tablet Chew 81 mg by mouth daily.    atorvastatin 40 MG tablet Commonly known as: LIPITOR Take 40 mg by mouth daily.    cephALEXin 500 MG capsule Commonly known as: KEFLEX Take 1 capsule (500 mg total) by mouth every 6 (six) hours for 5 days.    citalopram 20 MG tablet Commonly known as: CELEXA Take 20 mg by mouth daily.    feeding supplement Liqd Take 237 mLs by mouth 3 (three) times daily between meals.    furosemide 20 MG tablet Commonly known as: LASIX Take 1 tablet (20 mg total) by mouth daily.    gabapentin 300 MG capsule Commonly known as: NEURONTIN Take 300 mg by mouth 2 (two) times daily.    lactulose 10 GM/15ML solution Commonly known as: CHRONULAC Take 45 mLs (30 g total) by mouth 3 (three) times daily. Please titrate to have 2-3 soft bowel movements daily What changed: additional instructions    multivitamin with minerals Tabs tablet Take 1 tablet by mouth daily.    nitroGLYCERIN 0.4 MG/SPRAY spray Commonly known as: NITROLINGUAL Place 1 spray under the tongue every 5  (five) minutes x 3 doses as needed for chest pain.    ondansetron 4 MG tablet Commonly known as: ZOFRAN Take 1 tablet (4 mg total) by mouth every 6 (six) hours as needed for nausea.    pantoprazole 40 MG tablet Commonly known as: PROTONIX Take 40 mg by mouth daily.    phenol 1.4 % Liqd Commonly known as: CHLORASEPTIC Use as directed 1 spray in the mouth or throat as needed for throat irritation / pain.    QUEtiapine 50 MG tablet Commonly known as: SEROQUEL Take 50 mg by mouth at bedtime.    senna 8.6 MG Tabs tablet Commonly known as: SENOKOT Take 1 tablet (8.6 mg total) by mouth 2 (two) times daily as needed for mild constipation.    spironolactone 25 MG tablet Commonly known as: ALDACTONE Take 25 mg by mouth daily.    Ventolin HFA 108 (90 Base) MCG/ACT inhaler Generic drug: albuterol Inhale 2 puffs into the lungs every 4 (four) hours as needed for wheezing.    albuterol (2.5 MG/3ML) 0.083% nebulizer solution Commonly known as: PROVENTIL Take 2.5 mg by nebulization every 4 (four) hours as needed for wheezing or shortness of breath.    Xifaxan 550 MG Tabs tablet Generic drug: rifaximin Take 550 mg by mouth 2 (two) times daily.    Relevant Imaging Results:  Relevant Lab Results:   Additional Information SS#: [id number]  Margarito Liner, LCSW

## 2023-12-05 NOTE — TOC CM/SW Note (Signed)
 Per APS supervisor (regarding if they are agreeable to discharge back to [facility]), "We do not have any authority to say. If the hospital feels it is appropriate for him to go back to his prior placement, I do not see why that would be an issue. The Department has received the request for a guardian."  Charlynn Court, CSW [phone]

## 2023-12-05 NOTE — Plan of Care (Signed)

## 2023-12-05 NOTE — TOC Transition Note (Signed)
 Transition of Care [facility]) - Discharge Note   Patient Details  Name: [name] MRN: [id number] Date of Birth: [birth year]  Transition of Care [facility]) CM/SW Contact:  Alesia Richards, RN Phone Number: 12/05/2023, 11:53 AM   Clinical Narrative:     Noted, discharge orders and discharge summary. CM to patient's room regarding pending discharge. Per patient, has a point of contact who will provide transportation for discharge. Noted, home health PT recommendations. CM call to Doristine Mango, point of contact, phone: [phone] regarding pending discharge and home health orders. Per Doristine Mango, will pick up patient at 1230 at front of hospital. Per Doristine Mango, [facility] is active in the home. CM alert to RN [place] regarding patient's point of contact transport instructions. CM call to [facility],phone: [phone] regarding home health orders. Per Elnita Maxwell, will follow for resumption of care services.   Final next level of care: Group Home Barriers to Discharge: No Barriers Identified   Patient Goals and CMS Choice    Return to prior facility, Group Home   Discharge Placement  Return to prior facility, Group Home               Discharge Plan and Services Additional resources added to the After Visit Summary for             Social Drivers of Health (SDOH) Interventions SDOH Screenings   Food Insecurity: No Food Insecurity (11/29/2023)  Housing: Low Risk  (11/29/2023)  Transportation Needs: No Transportation Needs (11/29/2023)  Utilities: Not At Risk (11/29/2023)  Alcohol Screen: Low Risk  (09/24/2022)  Depression (PHQ2-9): High Risk (04/26/2022)  Financial Resource Strain: High Risk (04/27/2022)  Physical Activity: Sufficiently Active (04/16/2018)  Social Connections: Socially Isolated (11/29/2023)  Stress: Stress Concern Present (04/16/2018)  Tobacco Use: High Risk (11/29/2023)     Readmission Risk Interventions    11/29/2023    5:03 PM 11/13/2023    2:21 PM 11/12/2023     4:00 PM  Readmission Risk Prevention Plan  Transportation Screening Not Complete  Not Complete  Medication Review Oceanographer) Not Complete Complete Not Complete  PCP or Specialist appointment within 3-5 days of discharge Not Complete Complete Complete  HRI or Home Care Consult Not Complete Complete   SW Recovery Care/Counseling Consult Not Complete Complete   Palliative Care Screening Not Applicable Complete   Skilled Nursing Facility Not Applicable Not Applicable

## 2023-12-05 NOTE — Discharge Summary (Signed)
 Physician Discharge Summary   Patient: [name] MRN: [id number] DOB: [birth year]  Admit date:     11/29/2023  Discharge date: 12/05/23  Discharge Physician: Arnetha Courser   PCP: [name]   Recommendations at discharge:  Please obtain CBC, INR and CMP on follow-up Follow-up with primary care provider Follow-up with his gastroenterologist  Discharge Diagnoses: Principal Problem:   Hepatic encephalopathy (HCC) Active Problems:   UTI (urinary tract infection)   Diabetes mellitus type 2, noninsulin dependent (HCC)   AKI (acute kidney injury) (HCC)   Hx of transient ischemic attack (TIA)   Sinus bradycardia   Pancytopenia (HCC)   Hyperlipidemia   MDD (major depressive disorder), recurrent severe, without psychosis (HCC)   HTN (hypertension)   Thrombocytopenia (HCC)   Uncontrolled type 2 diabetes mellitus with hyperglycemia, without long-term current use of insulin (HCC)   History of hepatitis C   Pure hypercholesterolemia   Weakness   Hospital Course: [name] is a 73 year old male with history of hepatic encephalopathy, liver cirrhosis, history of hepatitis C, hypertension, COPD, hyperlipidemia, non-insulin-dependent diabetes mellitus, depression, who presents from care home for chief concerns of altered mentation.  Vitals in the ED showed T of 98.1, rr 16, hr 48, blood pressure 152/75, SpO2 100% on room air.  Serum sodium is 140, potassium 3.8, chloride 110, bicarb 23, BUN 30, serum creatinine 1.55, EGFR 1.08, nonfasting blood glucose 114, WBC 3.4, hemoglobin 12.0, platelets of 84.  Ammonia level was elevated at 174.  CT head wo contrast: Was read as no acute intracranial abnormality.  ED treatment: Lactulose 30 g p.o. one-time dose, LR 1 L bolus.  4/5: Vitals with mild bradycardia, labs with improving creatinine to 1.36 and rest seems stable, urine cultures pending.  Oriented to self only, did not had any bowel movement with p.o.  lactulose-also added enema. Patient also has a chronic Foley in place. Multiple hospitalization with concern of hepatic encephalopathy over the past 2 months-palliative care was also consulted. Ordered PT and OT evaluation as his prior group home might not take him back.  4/6: Persistent bradycardia which seems chronic, EKG was repeated today and shows chronic junctional rhythm, no acute abnormality.  Remained asymptomatic.  Patient need POA/guardianship.  Slight worsening of INR and T. bili-patient is high risk for mortality based on significant underlying comorbidities and advanced liver cirrhosis.  Having bowel movements now. Oriented to self only and still appears lethargic.  4/7: Hemodynamically stable.  Improving T. bili and INR.  Ammonia improved to 69.  Remains oriented to self only but talking more appropriately.  PT is recommending SNF-will be a difficult placement as he is a registered sex offender.  4/8: Remains stable and at baseline.  Having 1-2 daily bowel movements.   Urine cultures with pretty good sensitivity Klebsiella pneumonia, patient has a chronic Foley in place, no fever or leukocytosis, decided to treat with Keflex due to increased risk. Awaiting placement.  He also need guardianship-so likely will stay in hospital for a long time.  4/9: Hemodynamically stable, improving physical strength so PT changed their recommendations for home health.  TOC to see if his prior group home can take him back.  4/10: Patient remained stable and now at his baseline.  Patient is being discharged back to his group home.  Group home will work with DSS for guardianship.  Patient is oriented to self most of the time and occasionally to place.  He should avoid constipation to prevent recurrent hospitalization.  His lactulose does need to be titrated and he should be getting at least 2 soft bowel movements daily.  Dose can be increased if that goal is not achieved and can be decreased if he develops  diarrhea.  Home health services was also ordered for assistance.  Patient will remain high risk for readmission and mortality based on underlying comorbidities and advanced age.  He will continue on current medications and need to have a close follow-up with his providers for further assistance.  Assessment and Plan: * Hepatic encephalopathy (HCC) CT head without any acute abnormality and significantly elevated ammonia which is consistent with hepatic encephalopathy.  Mentation seems improving with improving ammonia level. Home lactulose resumed on admission Started having bowel movements. Rifaximin 550 mg p.o. recent on admission Started on diet  Aspiration, fall precaution  Diabetes mellitus type 2, noninsulin dependent (HCC) CBG remained within goal  UTI (urinary tract infection) Urine cultures which were obtained on admission now with Klebsiella pneumoniae, shows only resistance to ampicillin. Patient has a chronic Foley in place, which was replaced Start him on Keflex although unable to explain any urinary symptoms-but he is high risk-need to complete a 5-day course.  Hx of transient ischemic attack (TIA) Home aspirin and atorvastatin resumed on admission  AKI (acute kidney injury) (HCC) Resolved - Monitor renal function -Avoid nephrotoxins  Sinus bradycardia Asymptomatic and seems chronic.  Repeat EKG was obtained and it shows chronic junctional rhythm and no acute changes Heart rate in 60s today -Continue to monitor -Avoid nodal blocking agents  Hyperlipidemia Home atorvastatin 40 mg daily resume for 4/5  HTN (hypertension) Home losartan 50 mg daily, spironolactone 25 mg daily, furosemide 20 mg daily were resumed on admission  Thrombocytopenia (HCC) At baseline  Consultants: None Procedures performed: None Disposition: Group home Diet recommendation:  Discharge Diet Orders (From admission, onward)     Start     Ordered   12/05/23 0000  Diet - low sodium  heart healthy        12/05/23 1033           Cardiac and Carb modified diet DISCHARGE MEDICATION: Allergies as of 12/05/2023       Reactions   Bee Venom Anaphylaxis   Codeine Itching   Only itching. Recently allergy tested at [facility]   Rsv Mrna Pre-f Virus Vaccine Swelling   Meloxicam    Other Reaction(s): ?overdose of local given at dentist--had trouble breathing   Codeine    Novocain [procaine]    Sulfa Antibiotics Other (See Comments)   Sulfa Antibiotics    hallucinations        Medication List     STOP taking these medications    losartan 50 MG tablet Commonly known as: COZAAR       TAKE these medications    acetaminophen 650 MG CR tablet Commonly known as: TYLENOL Take 650 mg by mouth every 8 (eight) hours as needed for pain.   aspirin 81 MG chewable tablet Chew 81 mg by mouth daily.   atorvastatin 40 MG tablet Commonly known as: LIPITOR Take 40 mg by mouth daily.   cephALEXin 500 MG capsule Commonly known as: KEFLEX Take 1 capsule (500 mg total) by mouth every 6 (six) hours for 5 days.   citalopram 20 MG tablet Commonly known as: CELEXA Take 20 mg by mouth daily.   feeding supplement Liqd Take 237 mLs by mouth 3 (three) times daily between meals.   furosemide 20 MG tablet Commonly known as: LASIX  Take 1 tablet (20 mg total) by mouth daily.   gabapentin 300 MG capsule Commonly known as: NEURONTIN Take 300 mg by mouth 2 (two) times daily.   lactulose 10 GM/15ML solution Commonly known as: CHRONULAC Take 45 mLs (30 g total) by mouth 3 (three) times daily. Please titrate to have 2-3 soft bowel movements daily What changed: additional instructions   multivitamin with minerals Tabs tablet Take 1 tablet by mouth daily.   nitroGLYCERIN 0.4 MG/SPRAY spray Commonly known as: NITROLINGUAL Place 1 spray under the tongue every 5 (five) minutes x 3 doses as needed for chest pain.   ondansetron 4 MG tablet Commonly known as: ZOFRAN Take 1 tablet  (4 mg total) by mouth every 6 (six) hours as needed for nausea.   pantoprazole 40 MG tablet Commonly known as: PROTONIX Take 40 mg by mouth daily.   phenol 1.4 % Liqd Commonly known as: CHLORASEPTIC Use as directed 1 spray in the mouth or throat as needed for throat irritation / pain.   QUEtiapine 50 MG tablet Commonly known as: SEROQUEL Take 50 mg by mouth at bedtime.   senna 8.6 MG Tabs tablet Commonly known as: SENOKOT Take 1 tablet (8.6 mg total) by mouth 2 (two) times daily as needed for mild constipation.   spironolactone 25 MG tablet Commonly known as: ALDACTONE Take 25 mg by mouth daily.   Ventolin HFA 108 (90 Base) MCG/ACT inhaler Generic drug: albuterol Inhale 2 puffs into the lungs every 4 (four) hours as needed for wheezing.   albuterol (2.5 MG/3ML) 0.083% nebulizer solution Commonly known as: PROVENTIL Take 2.5 mg by nebulization every 4 (four) hours as needed for wheezing or shortness of breath.   Xifaxan 550 MG Tabs tablet Generic drug: rifaximin Take 550 mg by mouth 2 (two) times daily.        Follow-up Information     [facility] Follow up.   Why: Hospital follow up Contact information: [address] [place] [address] [phone]                Discharge Exam: Ceasar Mons Weights   11/29/23 1237  Weight: 81.4 kg   General.  Frail elderly man, in no acute distress. Pulmonary.  Lungs clear bilaterally, normal respiratory effort. CV.  Regular rate and rhythm, no JVD, rub or murmur. Abdomen.  Soft, nontender, nondistended, BS positive. CNS.  Alert and oriented x 2.  No focal neurologic deficit. Extremities.  No edema, no cyanosis, pulses intact and symmetrical.  Condition at discharge: stable  The results of significant diagnostics from this hospitalization (including imaging, microbiology, ancillary and laboratory) are listed below for reference.   Imaging Studies: DG Chest Port 1 View Result Date:  11/29/2023 CLINICAL DATA:  Altered level of consciousness EXAM: PORTABLE CHEST 1 VIEW COMPARISON:  11/11/2023 FINDINGS: The heart size and mediastinal contours are within normal limits. Both lungs are clear. The visualized skeletal structures are unremarkable. IMPRESSION: No active disease. Electronically Signed   By: Sharlet Salina M.D.   On: 11/29/2023 18:17   CT Head Wo Contrast Result Date: 11/29/2023 CLINICAL DATA:  Mental status change, unknown cause. EXAM: CT HEAD WITHOUT CONTRAST TECHNIQUE: Contiguous axial images were obtained from the base of the skull through the vertex without intravenous contrast. RADIATION DOSE REDUCTION: This exam was performed according to the departmental dose-optimization program which includes automated exposure control, adjustment of the mA and/or kV according to patient size and/or use of iterative reconstruction technique. COMPARISON:  Head CT 11/21/2023  FINDINGS: Brain: There is no evidence of an acute infarct, intracranial hemorrhage, mass, midline shift, or extra-axial fluid collection. Mild cerebral atrophy is within normal limits for age. The ventricles are normal in size. Vascular: No hyperdense vessel or unexpected calcification. Skull: No acute fracture or suspicious lesion. Sinuses/Orbits: No acute finding. Other: None. IMPRESSION: No evidence of acute intracranial abnormality. Electronically Signed   By: Sebastian Ache M.D.   On: 11/29/2023 14:05   CT HEAD WO CONTRAST ( ) Result Date: 11/21/2023 CLINICAL DATA:  Possible seizure.  Neuro deficit. EXAM: CT HEAD WITHOUT CONTRAST TECHNIQUE: Contiguous axial images were obtained from the base of the skull through the vertex without intravenous contrast. RADIATION DOSE REDUCTION: This exam was performed according to the departmental dose-optimization program which includes automated exposure control, adjustment of the mA and/or kV according to patient size and/or use of iterative reconstruction technique. COMPARISON:   Head CT from 10 days ago FINDINGS: Brain: No evidence of acute infarction, hemorrhage, hydrocephalus, extra-axial collection or mass lesion/mass effect. Vascular: No hyperdense vessel or unexpected calcification. Skull: Normal. Negative for fracture or focal lesion. Sinuses/Orbits: No acute finding. IMPRESSION: Stable and negative head CT. Electronically Signed   By: Tiburcio Pea M.D.   On: 11/21/2023 11:20   CT Head Wo Contrast Result Date: 11/12/2023 CLINICAL DATA:  Mental status change, unknown cause, found unresponsive EXAM: CT HEAD WITHOUT CONTRAST TECHNIQUE: Contiguous axial images were obtained from the base of the skull through the vertex without intravenous contrast. RADIATION DOSE REDUCTION: This exam was performed according to the departmental dose-optimization program which includes automated exposure control, adjustment of the mA and/or kV according to patient size and/or use of iterative reconstruction technique. COMPARISON:  11/04/2023 FINDINGS: Brain: No intracranial hemorrhage, mass effect, or evidence of acute infarct. No hydrocephalus. No extra-axial fluid collection. Age-commensurate cerebral atrophy and chronic small vessel ischemic disease. Vascular: No hyperdense vessel. Intracranial arterial calcification. Skull: No fracture or focal lesion. Sinuses/Orbits: No acute finding. Other: None. IMPRESSION: No acute intracranial abnormality. Electronically Signed   By: Minerva Fester M.D.   On: 11/12/2023 00:04   DG Chest Port 1 View Result Date: 11/11/2023 CLINICAL DATA:  Found unresponsive, confusion EXAM: PORTABLE CHEST 1 VIEW COMPARISON:  10/31/2023 FINDINGS: The heart size and mediastinal contours are within normal limits. Both lungs are clear. The visualized skeletal structures are unremarkable. IMPRESSION: No active disease. Electronically Signed   By: Sharlet Salina M.D.   On: 11/11/2023 23:41    Microbiology: Results for orders placed or performed during the hospital encounter  of 11/29/23  Urine Culture     Status: Abnormal   Collection Time: 11/29/23  4:38 PM   Specimen: Urine, Random  Result Value Ref Range Status   Specimen Description   Final    URINE, RANDOM Performed at [facility], [address] [place], [place] [address]    Special Requests   Final    NONE Reflexed from [id number] Performed at [facility], [address] [place], [place] [address]    Culture >=100,000 COLONIES/mL KLEBSIELLA PNEUMONIAE (A)  Final   Report Status 12/02/2023 FINAL  Final   Organism ID, Bacteria KLEBSIELLA PNEUMONIAE (A)  Final      Susceptibility   Klebsiella pneumoniae - MIC*    AMPICILLIN RESISTANT Resistant     CEFAZOLIN <=4 SENSITIVE Sensitive     CEFEPIME <=0.12 SENSITIVE Sensitive     CEFTRIAXONE <=0.25 SENSITIVE Sensitive     CIPROFLOXACIN <=0.25 SENSITIVE Sensitive     GENTAMICIN <=  1 SENSITIVE Sensitive     IMIPENEM <=0.25 SENSITIVE Sensitive     NITROFURANTOIN 32 SENSITIVE Sensitive     TRIMETH/SULFA <=20 SENSITIVE Sensitive     AMPICILLIN/SULBACTAM 4 SENSITIVE Sensitive     PIP/TAZO <=4 SENSITIVE Sensitive ug/mL    * >=100,000 COLONIES/mL KLEBSIELLA PNEUMONIAE  Resp panel by RT-PCR (RSV, Flu A&B, Covid) Anterior Nasal Swab     Status: None   Collection Time: 11/29/23  6:38 PM   Specimen: Anterior Nasal Swab  Result Value Ref Range Status   SARS Coronavirus 2 by RT PCR NEGATIVE NEGATIVE Final    Comment: (NOTE) SARS-CoV-2 target nucleic acids are NOT DETECTED.  The SARS-CoV-2 RNA is generally detectable in upper respiratory specimens during the acute phase of infection. The lowest concentration of SARS-CoV-2 viral copies this assay can detect is 138 copies/mL. A negative result does not preclude SARS-Cov-2 infection and should not be used as the sole basis for treatment or other patient management decisions. A negative result may occur with  improper specimen collection/handling, submission of specimen other than  nasopharyngeal swab, presence of viral mutation(s) within the areas targeted by this assay, and inadequate number of viral copies(<138 copies/mL). A negative result must be combined with clinical observations, patient history, and epidemiological information. The expected result is Negative.  Fact Sheet for Patients:  BloggerCourse.com  Fact Sheet for Healthcare Providers:  SeriousBroker.it  This test is no t yet approved or cleared by the [place] FDA and  has been authorized for detection and/or diagnosis of SARS-CoV-2 by FDA under an Emergency Use Authorization (EUA). This EUA will remain  in effect (meaning this test can be used) for the duration of the COVID-19 declaration under Section 564(b)(1) of the Act, 21 U.S.C.section 360bbb-3(b)(1), unless the authorization is terminated  or revoked sooner.       Influenza A by PCR NEGATIVE NEGATIVE Final   Influenza B by PCR NEGATIVE NEGATIVE Final    Comment: (NOTE) The Xpert Xpress SARS-CoV-2/FLU/RSV plus assay is intended as an aid in the diagnosis of influenza from Nasopharyngeal swab specimens and should not be used as a sole basis for treatment. Nasal washings and aspirates are unacceptable for Xpert Xpress SARS-CoV-2/FLU/RSV testing.  Fact Sheet for Patients: BloggerCourse.com  Fact Sheet for Healthcare Providers: SeriousBroker.it  This test is not yet approved or cleared by the [place] FDA and has been authorized for detection and/or diagnosis of SARS-CoV-2 by FDA under an Emergency Use Authorization (EUA). This EUA will remain in effect (meaning this test can be used) for the duration of the COVID-19 declaration under Section 564(b)(1) of the Act, 21 U.S.C. section 360bbb-3(b)(1), unless the authorization is terminated or revoked.     Resp Syncytial Virus by PCR NEGATIVE NEGATIVE Final    Comment:  (NOTE) Fact Sheet for Patients: BloggerCourse.com  Fact Sheet for Healthcare Providers: SeriousBroker.it  This test is not yet approved or cleared by the [place] FDA and has been authorized for detection and/or diagnosis of SARS-CoV-2 by FDA under an Emergency Use Authorization (EUA). This EUA will remain in effect (meaning this test can be used) for the duration of the COVID-19 declaration under Section 564(b)(1) of the Act, 21 U.S.C. section 360bbb-3(b)(1), unless the authorization is terminated or revoked.  Performed at [facility], [address] [place], [place] [address]     Labs: CBC: Recent Labs  Lab 11/29/23 1241 11/30/23 0334 12/01/23 0335 12/02/23 0822  WBC 3.5* 3.8* 3.7* 3.2*  HGB 12.0* 11.8* 10.2*  10.1*  HCT 34.8* 33.5* 29.0* 28.7*  MCV 93.3 90.5 90.3 92.0  PLT 84* 81* 74* 73*   Basic Metabolic Panel: Recent Labs  Lab 11/29/23 1241 11/30/23 0334 12/01/23 0335 12/02/23 0822  NA 140 141 138 136  K 3.8 3.9 3.5 3.5  CL 110 111 109 106  CO2 23 24 24 24   GLUCOSE 114* 89 95 73  BUN 30* 28* 23 18  CREATININE 1.55* 1.36* 1.07 1.04  CALCIUM 9.9 9.9 9.5 9.5   Liver Function Tests: Recent Labs  Lab 11/29/23 1241 12/01/23 0335 12/02/23 0822  AST 40 33 35  ALT 30 24 25   ALKPHOS 59 51 49  BILITOT 1.6* 1.8* 1.6*  PROT 6.2* 5.6* 5.7*  ALBUMIN 2.5* 2.3* 2.3*   CBG: Recent Labs  Lab 12/03/23 1640 12/03/23 2017 12/04/23 0610 12/04/23 2045 12/05/23 0510  GLUCAP 114* 109* 77 117* 90    Discharge time spent: greater than 30 minutes.  This record has been created using Conservation officer, historic buildings. Errors have been sought and corrected,but may not always be located. Such creation errors do not reflect on the standard of care.   Signed: Arnetha Courser, MD [facility] 12/05/2023

## 2023-12-16 NOTE — Progress Notes (Deleted)
  Electrophysiology Office Note:   Date:  12/16/2023  ID:  [name], DOB 10-10-1950, MRN [id number]  Primary Cardiologist: None Electrophysiologist: Ardeen Kohler, MD  {Click to update primary MD,subspecialty MD or APP then REFRESH:1}    History of Present Illness:   [name] is a 73 y.o. male with h/o T2DM, HTN, COPD, migraines, hepatitis C cirrhosis, TIA, depression, anxiety obstructive sleep apnea, cervical stenosis status post C3-6 ACDF, hyperlipidemia, CKD and chronic diastolic heart failure seen today for concern for junctional rhythm.  He has had 7 ED visits/hospital admissions in the past 2 months, primarily for decompensated cirrhosis and hepatic encephalopathy.  Discussed the use of AI scribe software for clinical note transcription with the patient, who gave verbal consent to proceed.  History of Present Illness     Review of systems complete and found to be negative unless listed in HPI.   EP Information / Studies Reviewed:    EKG is not ordered today. EKG from 12/01/23 reviewed which showed sinus bradycardia      Echo 12/24/22:  1. Left ventricular ejection fraction, by estimation, is 60 to 65%. The  left ventricle has normal function. The left ventricle has no regional  wall motion abnormalities. There is mild left ventricular hypertrophy.  Left ventricular diastolic parameters  were normal.   2. Right ventricular systolic function is normal. The right ventricular  size is normal. Tricuspid regurgitation signal is inadequate for assessing  PA pressure.   3. Left atrial size was mildly dilated.   4. The mitral valve is normal in structure. No evidence of mitral valve  regurgitation. No evidence of mitral stenosis.   5. The aortic valve is normal in structure. Aortic valve regurgitation is  not visualized. No aortic stenosis is present.       Physical Exam:   VS:  There were no vitals taken for this visit.   Wt Readings from Last 3 Encounters:   11/29/23 179 lb 7.3 oz (81.4 kg)  11/21/23 181 lb (82.1 kg)  11/11/23 177 lb 4 oz (80.4 kg)     GEN: Well nourished, well developed in no acute distress NECK: No JVD CARDIAC: {EPRHYTHM:28826}, no murmurs, rubs, gallops RESPIRATORY:  Clear to auscultation without rales, wheezing or rhonchi  ABDOMEN: Soft, non-distended EXTREMITIES:  No edema; No deformity   ASSESSMENT AND PLAN:    #. Sinus bradycardia: EKGs in question are not junctional rhythm but rather sinus bradycardia with low amplitude P waves.  #. Hypertension *** goal today.  Recommend checking blood pressures 1-2 times per week at home and recording the values.  Recommend bringing these recordings to the primary care physician.   Follow up with {NWGNF:62130} {EPFOLLOW QM:[id number]}  Signed, Ardeen Kohler, MD

## 2023-12-17 ENCOUNTER — Ambulatory Visit: Attending: Cardiology | Admitting: Cardiology

## 2023-12-21 ENCOUNTER — Other Ambulatory Visit: Payer: Self-pay

## 2023-12-21 ENCOUNTER — Emergency Department
Admission: EM | Admit: 2023-12-21 | Discharge: 2023-12-22 | Disposition: A | Discharge status: Home / Self Care | Attending: Emergency Medicine | Admitting: Emergency Medicine

## 2023-12-21 DIAGNOSIS — T839XXA Unspecified complication of genitourinary prosthetic device, implant and graft, initial encounter: Secondary | ICD-10-CM

## 2023-12-21 DIAGNOSIS — E119 Type 2 diabetes mellitus without complications: Secondary | ICD-10-CM | POA: Insufficient documentation

## 2023-12-21 DIAGNOSIS — I5030 Unspecified diastolic (congestive) heart failure: Secondary | ICD-10-CM | POA: Diagnosis not present

## 2023-12-21 DIAGNOSIS — Z5900 Homelessness unspecified: Secondary | ICD-10-CM | POA: Diagnosis not present

## 2023-12-21 DIAGNOSIS — J4489 Other specified chronic obstructive pulmonary disease: Secondary | ICD-10-CM | POA: Diagnosis not present

## 2023-12-21 DIAGNOSIS — T83098A Other mechanical complication of other indwelling urethral catheter, initial encounter: Secondary | ICD-10-CM | POA: Diagnosis present

## 2023-12-21 DIAGNOSIS — R310 Gross hematuria: Secondary | ICD-10-CM

## 2023-12-21 DIAGNOSIS — I110 Hypertensive heart disease with heart failure: Secondary | ICD-10-CM | POA: Diagnosis not present

## 2023-12-21 DIAGNOSIS — I2510 Atherosclerotic heart disease of native coronary artery without angina pectoris: Secondary | ICD-10-CM | POA: Diagnosis not present

## 2023-12-21 NOTE — ED Triage Notes (Signed)
 Pt to ed from group home for foley catheter issues. Pt catheter came out today. Pt is alert and oriented to baseline.

## 2023-12-21 NOTE — ED Provider Notes (Signed)
 [facility] Provider Note    Event Date/Time   First MD Initiated Contact with Patient 12/21/23 2304     (approximate)   History   Chief Complaint: foley catheter issue   HPI  [name] is a 73 y.o. male with a history of heart failure, cirrhosis, hypertension, diabetes, chronic Foley catheter use who is sent to the ED from his group home due to inadvertently removing his Foley catheter.  He reports a small amount of pain at the tip of his penis  Patient brings intact Foley catheter with inflated balloon in a plastic bag.      Past Medical History:  Diagnosis Date   (HFpEF) heart failure with preserved ejection fraction (HCC)    a.) TTE 03/20/2018: EF 60-65%, no RWMAs, mild LAE, mild-mod MR, PASP 42; b.) TTE 03/31/2021: EF 55-60%, no RWMAs, mild LVH, mild LAE, AoV sclerosis without stenosis; c.) TTE 03/25/2022: EF 60-65%, no RWMAs, AoV sclerosis without stenosis; d.) TTE 12/24/2022: EF 60-65%, no RWMAs, mild LVH, mild LAE.   Anasarca    Anemia    Anxiety    Aortic atherosclerosis (HCC)    Arthritis    Asthma    Bacteremia due to Escherichia coli 05/2023   Bacteremia due to Klebsiella pneumoniae 2023   Bilateral carotid artery disease (HCC)    Bradycardia    CAD (coronary artery disease) 10/21/2006   a.) MV 10/21/2006: small reversible defect involving the apical  lateral wall c/w possible ischemia; b.) MV: 03/24/2018: no ischemia; c.) MV 10/04/2021: LAD calcifications   CAD (coronary artery disease)    Cervical radiculopathy    Chest pain    a.) MV 03/21/2018: no ischemia; b.) MV 10/04/2021: no ischemia   Chronic back pain    Chronic common bile duct dilatation    Cirrhosis (HCC)    a.) (+) hepatic lesion on CT; likely hepatocellular carcinoma; b.) CT evidence of associated portal hypertension; c.) (+) abdominopelvic anasarca   COPD (chronic obstructive pulmonary disease) (HCC)    DDD (degenerative disc disease), lumbar    Depression     Frequent falls    GERD (gastroesophageal reflux disease)    HBV (hepatitis B virus) infection    HCV (hepatitis C virus)    Hernia of anterior abdominal wall    History of 2019 novel coronavirus disease (COVID-19) 04/16/2020   Homelessness    a.) limited on placement as patient is listed on [place] sex offender listing for exploitation of a minor (possession of child pornography); incarcerated 12 years (released 11/2017)   Hyperlipidemia    Hypertension    Infestation by bed bug 02/2022   Malingering    Migraine    Nephrolithiasis    Obesity    Portal hypertension (HCC)    PTSD (post-traumatic stress disorder)    a.) brief training in the [employer] at age 39; someone was killed in front of him   Pulmonary HTN (HCC)    a.) multiple CT scans desmontrating dilated PA c/w pHTN; b.) TTE 03/20/2018: PASP 42 mmHg   Sleep apnea    a.) no nocturnal PAP therapy   Sleep apnea    Stenosis of cervical spine with myelopathy (HCC)    Suicidal ideations    T2DM (type 2 diabetes mellitus) (HCC)    Thrombocytopenia (HCC)    a.) related to hepatic cirrhosis; spleen normal in size   TIA (transient ischemic attack)    TIA (transient ischemic attack) 09/30/2023    Current Outpatient Rx  Order #: [id number] Class: Historical Med   Order #: [id number] Class: Historical Med   Order #: [id number] Class: Historical Med   Order #: [id number] Class: Historical Med   Order #: [id number] Class: Historical Med   Order #: [id number] Class: Normal   Order #: [id number] Class: Normal   Order #: [id number] Class: Historical Med   Order #: [id number] Class: Normal   Order #: [id number] Class: Historical Med   Order #: [id number] Class: Normal   Order #: [id number] Class: Normal   Order #: [id number] Class: Historical Med   Order #: [id number] Class: Historical Med   Order #: [id number] Class: Historical Med   Order #: [id number] Class: Normal   Order #: [id number] Class: Historical Med   Order #: [id number] Class: Historical Med   Order #:  [id number] Class: Historical Med    Past Surgical History:  Procedure Laterality Date   ANTERIOR CERVICAL DECOMP/DISCECTOMY FUSION N/A 07/01/2023   Procedure: ANTERIOR CERVICAL DISCECTOMY AND FUSION (FORGE);  Surgeon: Jodeen Munch, MD;  Location: [facility] ORS;  Service: Neurosurgery;  Laterality: N/A;  Keep as last case of the day per Dr Mont Antis   ANTERIOR CERVICAL DISCECTOMY     APPENDECTOMY     CARDIAC CATHETERIZATION     CHOLECYSTECTOMY     EYE SURGERY     INNER EAR SURGERY     NOSE SURGERY     XI ROBOTIC LAPAROSCOPIC ASSISTED APPENDECTOMY N/A 11/05/2022   Procedure: XI ROBOTIC LAPAROSCOPIC ASSISTED APPENDECTOMY;  Surgeon: Eldred Grego, MD;  Location: [facility] ORS;  Service: General;  Laterality: N/A;    Physical Exam   Triage Vital Signs: ED Triage Vitals  Encounter Vitals Group     BP 12/21/23 2039 114/61     Systolic BP Percentile --      Diastolic BP Percentile --      Pulse Rate 12/21/23 2039 97     Resp 12/21/23 2039 18     Temp 12/21/23 2039 98.9 F (37.2 C)     Temp Source 12/21/23 2039 Oral     SpO2 12/21/23 2039 100 %     Weight 12/21/23 2039 178 lb 9.2 oz (81 kg)     Height 12/21/23 2037 5\' 6"  (1.676 m)     Head Circumference --      Peak Flow --      Pain Score 12/21/23 2037 0     Pain Loc --      Pain Education --      Exclude from Growth Chart --     Most recent vital signs: Vitals:   12/21/23 2039 12/21/23 2300  BP: 114/61 120/60  Pulse: 97 88  Resp: 18 18  Temp: 98.9 F (37.2 C)   SpO2: 100% 98%    General: Awake, no distress.  CV:  Good peripheral perfusion.  Regular rate Resp:  Normal effort.  Abd:  No distention.  Soft nontender Other:  There is slow dripping of blood from the urethral meatus.  No laceration or visible trauma, no tenderness.   ED Results / Procedures / Treatments   Labs (all labs ordered are listed, but only abnormal results are displayed) Labs Reviewed - No data to  display   EKG    RADIOLOGY    PROCEDURES:  Procedures   MEDICATIONS ORDERED IN ED: Medications - No data to display   IMPRESSION / MDM / ASSESSMENT AND PLAN / ED COURSE  I reviewed the triage vital signs and the nursing notes.  Patient's presentation is most consistent with acute presentation with potential threat to life or bodily function.  Patient with  chronic Foley catheter use presents after inadvertently removing the catheter with the balloon inflated at his group home.  Not in pain.  He is having some urethral bleeding.  Otherwise is alert, oriented, calm and not in distress.  Will attempt reinsertion of the Foley catheter.   ----------------------------------------- 2:19 AM on 12/22/2023 ----------------------------------------- New Foley catheter in place, good urine drainage into the collection bag.  Urine is thin, blood-tinged.  Counseled patient on warning signs of Foley catheter obstruction.  Recommend follow-up with urology office this week for monitoring of his healing and hematuria.      FINAL CLINICAL IMPRESSION(S) / ED DIAGNOSES   Final diagnoses:  Gross hematuria  Complication of Foley catheter, initial encounter (HCC)     Rx / DC Orders   ED Discharge Orders     None        Note:  This document was prepared using Dragon voice recognition software and may include unintentional dictation errors.   Jacquie Maudlin, MD 12/22/23 [phone]

## 2023-12-22 NOTE — Discharge Instructions (Signed)
 A new urinary catheter was placed in the emergency department today, and it is draining well.  He will continue to have bloody urine output over the next several days while the injury from removing the previous catheter heals.  Seek medical attention right away if there is fever, severe lower abdominal pain, or absence of urine output in the collection bag for several hours.

## 2023-12-30 ENCOUNTER — Other Ambulatory Visit: Payer: Self-pay

## 2023-12-30 ENCOUNTER — Inpatient Hospital Stay
Admission: EM | Admit: 2023-12-30 | Discharge: 2024-01-03 | DRG: 442 | Disposition: A | Discharge status: Home Health | Attending: Internal Medicine | Admitting: Internal Medicine

## 2023-12-30 ENCOUNTER — Emergency Department

## 2023-12-30 ENCOUNTER — Encounter: Payer: Self-pay | Admitting: Emergency Medicine

## 2023-12-30 DIAGNOSIS — I272 Pulmonary hypertension, unspecified: Secondary | ICD-10-CM | POA: Diagnosis present

## 2023-12-30 DIAGNOSIS — Z978 Presence of other specified devices: Secondary | ICD-10-CM

## 2023-12-30 DIAGNOSIS — K7460 Unspecified cirrhosis of liver: Secondary | ICD-10-CM | POA: Diagnosis present

## 2023-12-30 DIAGNOSIS — J4489 Other specified chronic obstructive pulmonary disease: Secondary | ICD-10-CM | POA: Diagnosis present

## 2023-12-30 DIAGNOSIS — D61818 Other pancytopenia: Secondary | ICD-10-CM | POA: Diagnosis present

## 2023-12-30 DIAGNOSIS — Z87442 Personal history of urinary calculi: Secondary | ICD-10-CM

## 2023-12-30 DIAGNOSIS — I130 Hypertensive heart and chronic kidney disease with heart failure and stage 1 through stage 4 chronic kidney disease, or unspecified chronic kidney disease: Secondary | ICD-10-CM | POA: Diagnosis present

## 2023-12-30 DIAGNOSIS — F329 Major depressive disorder, single episode, unspecified: Secondary | ICD-10-CM | POA: Diagnosis present

## 2023-12-30 DIAGNOSIS — Z6835 Body mass index (BMI) 35.0-35.9, adult: Secondary | ICD-10-CM

## 2023-12-30 DIAGNOSIS — F418 Other specified anxiety disorders: Secondary | ICD-10-CM | POA: Diagnosis not present

## 2023-12-30 DIAGNOSIS — I5032 Chronic diastolic (congestive) heart failure: Secondary | ICD-10-CM | POA: Diagnosis present

## 2023-12-30 DIAGNOSIS — F4310 Post-traumatic stress disorder, unspecified: Secondary | ICD-10-CM | POA: Diagnosis present

## 2023-12-30 DIAGNOSIS — E669 Obesity, unspecified: Secondary | ICD-10-CM | POA: Diagnosis present

## 2023-12-30 DIAGNOSIS — I10 Essential (primary) hypertension: Secondary | ICD-10-CM | POA: Diagnosis not present

## 2023-12-30 DIAGNOSIS — Z6832 Body mass index (BMI) 32.0-32.9, adult: Secondary | ICD-10-CM

## 2023-12-30 DIAGNOSIS — N1831 Chronic kidney disease, stage 3a: Secondary | ICD-10-CM | POA: Diagnosis present

## 2023-12-30 DIAGNOSIS — Z7982 Long term (current) use of aspirin: Secondary | ICD-10-CM

## 2023-12-30 DIAGNOSIS — I2510 Atherosclerotic heart disease of native coronary artery without angina pectoris: Secondary | ICD-10-CM | POA: Diagnosis present

## 2023-12-30 DIAGNOSIS — E1122 Type 2 diabetes mellitus with diabetic chronic kidney disease: Secondary | ICD-10-CM | POA: Diagnosis present

## 2023-12-30 DIAGNOSIS — J439 Emphysema, unspecified: Secondary | ICD-10-CM | POA: Diagnosis not present

## 2023-12-30 DIAGNOSIS — J449 Chronic obstructive pulmonary disease, unspecified: Secondary | ICD-10-CM | POA: Diagnosis present

## 2023-12-30 DIAGNOSIS — K766 Portal hypertension: Secondary | ICD-10-CM | POA: Diagnosis present

## 2023-12-30 DIAGNOSIS — E11649 Type 2 diabetes mellitus with hypoglycemia without coma: Secondary | ICD-10-CM | POA: Diagnosis present

## 2023-12-30 DIAGNOSIS — F419 Anxiety disorder, unspecified: Secondary | ICD-10-CM | POA: Diagnosis present

## 2023-12-30 DIAGNOSIS — Z8673 Personal history of transient ischemic attack (TIA), and cerebral infarction without residual deficits: Secondary | ICD-10-CM

## 2023-12-30 DIAGNOSIS — R001 Bradycardia, unspecified: Secondary | ICD-10-CM | POA: Diagnosis present

## 2023-12-30 DIAGNOSIS — N390 Urinary tract infection, site not specified: Secondary | ICD-10-CM | POA: Diagnosis present

## 2023-12-30 DIAGNOSIS — E876 Hypokalemia: Secondary | ICD-10-CM | POA: Diagnosis present

## 2023-12-30 DIAGNOSIS — K7682 Hepatic encephalopathy: Secondary | ICD-10-CM | POA: Diagnosis present

## 2023-12-30 DIAGNOSIS — Z66 Do not resuscitate: Secondary | ICD-10-CM | POA: Diagnosis present

## 2023-12-30 DIAGNOSIS — Z8249 Family history of ischemic heart disease and other diseases of the circulatory system: Secondary | ICD-10-CM

## 2023-12-30 DIAGNOSIS — R4182 Altered mental status, unspecified: Principal | ICD-10-CM

## 2023-12-30 DIAGNOSIS — Z79899 Other long term (current) drug therapy: Secondary | ICD-10-CM

## 2023-12-30 DIAGNOSIS — E785 Hyperlipidemia, unspecified: Secondary | ICD-10-CM | POA: Diagnosis present

## 2023-12-30 DIAGNOSIS — F17210 Nicotine dependence, cigarettes, uncomplicated: Secondary | ICD-10-CM | POA: Diagnosis present

## 2023-12-30 DIAGNOSIS — E1129 Type 2 diabetes mellitus with other diabetic kidney complication: Secondary | ICD-10-CM | POA: Diagnosis present

## 2023-12-30 DIAGNOSIS — Z8616 Personal history of COVID-19: Secondary | ICD-10-CM

## 2023-12-30 LAB — COMPREHENSIVE METABOLIC PANEL WITH GFR
ALT: 34 U/L (ref 0–44)
AST: 47 U/L — ABNORMAL HIGH (ref 15–41)
Albumin: 2.5 g/dL — ABNORMAL LOW (ref 3.5–5.0)
Alkaline Phosphatase: 70 U/L (ref 38–126)
Anion gap: 7 (ref 5–15)
BUN: 23 mg/dL (ref 8–23)
CO2: 26 mmol/L (ref 22–32)
Calcium: 9.7 mg/dL (ref 8.9–10.3)
Chloride: 109 mmol/L (ref 98–111)
Creatinine, Ser: 1.29 mg/dL — ABNORMAL HIGH (ref 0.61–1.24)
GFR, Estimated: 58 mL/min — ABNORMAL LOW (ref >60)
Glucose, Bld: 86 mg/dL (ref 70–99)
Potassium: 4 mmol/L (ref 3.5–5.1)
Sodium: 142 mmol/L (ref 135–145)
Total Bilirubin: 1.7 mg/dL — ABNORMAL HIGH (ref 0.0–1.2)
Total Protein: 6.6 g/dL (ref 6.5–8.1)

## 2023-12-30 LAB — CBC WITH DIFFERENTIAL/PLATELET
Abs Immature Granulocytes: 0.02 10*3/uL (ref 0.00–0.07)
Basophils Absolute: 0 10*3/uL (ref 0.0–0.1)
Basophils Relative: 2 %
Eosinophils Absolute: 0.1 10*3/uL (ref 0.0–0.5)
Eosinophils Relative: 5 %
HCT: 33.3 % — ABNORMAL LOW (ref 39.0–52.0)
Hemoglobin: 10.8 g/dL — ABNORMAL LOW (ref 13.0–17.0)
Immature Granulocytes: 1 %
Lymphocytes Relative: 29 %
Lymphs Abs: 0.8 10*3/uL (ref 0.7–4.0)
MCH: 31.8 pg (ref 26.0–34.0)
MCHC: 32.4 g/dL (ref 30.0–36.0)
MCV: 97.9 fL (ref 80.0–100.0)
Monocytes Absolute: 0.3 10*3/uL (ref 0.1–1.0)
Monocytes Relative: 11 %
Neutro Abs: 1.4 10*3/uL — ABNORMAL LOW (ref 1.7–7.7)
Neutrophils Relative %: 52 %
Platelets: 107 10*3/uL — ABNORMAL LOW (ref 150–400)
RBC: 3.4 MIL/uL — ABNORMAL LOW (ref 4.22–5.81)
RDW: 16.5 % — ABNORMAL HIGH (ref 11.5–15.5)
WBC: 2.7 10*3/uL — ABNORMAL LOW (ref 4.0–10.5)
nRBC: 0 % (ref 0.0–0.2)

## 2023-12-30 LAB — PROTIME-INR
INR: 1.4 — ABNORMAL HIGH (ref 0.8–1.2)
Prothrombin Time: 16.9 s — ABNORMAL HIGH (ref 11.4–15.2)

## 2023-12-30 LAB — MAGNESIUM: Magnesium: 1.6 mg/dL — ABNORMAL LOW (ref 1.7–2.4)

## 2023-12-30 LAB — APTT: aPTT: 35 s (ref 24–36)

## 2023-12-30 LAB — PHOSPHORUS: Phosphorus: 3.3 mg/dL (ref 2.5–4.6)

## 2023-12-30 LAB — AMMONIA: Ammonia: 117 umol/L — ABNORMAL HIGH (ref 9–35)

## 2023-12-30 LAB — BRAIN NATRIURETIC PEPTIDE: B Natriuretic Peptide: 381.2 pg/mL — ABNORMAL HIGH (ref 0.0–100.0)

## 2023-12-30 MED ORDER — NITROGLYCERIN 0.4 MG SL SUBL
0.4000 mg | SUBLINGUAL_TABLET | SUBLINGUAL | Status: DC | PRN
  Filled 2023-12-30: qty 1

## 2023-12-30 MED ORDER — ONDANSETRON HCL 4 MG/2ML IJ SOLN: 4.0000 mg | Freq: Three times a day (TID) | INTRAMUSCULAR | Status: DC | PRN

## 2023-12-30 MED ORDER — HYDRALAZINE HCL 20 MG/ML IJ SOLN: 5.0000 mg | INTRAMUSCULAR | Status: DC | PRN

## 2023-12-30 MED ORDER — POTASSIUM CHLORIDE CRYS ER 20 MEQ PO TBCR: 40.0000 meq | EXTENDED_RELEASE_TABLET | Freq: Once | ORAL | Status: DC

## 2023-12-30 MED ORDER — CITALOPRAM HYDROBROMIDE 20 MG PO TABS
20.0000 mg | ORAL_TABLET | Freq: Every day | ORAL | Status: DC
  Administered 2023-12-31 – 2024-01-03 (×4): 20 mg via ORAL
  Filled 2023-12-30 (×4): qty 1

## 2023-12-30 MED ORDER — ADULT MULTIVITAMIN W/MINERALS CH
1.0000 | ORAL_TABLET | Freq: Every day | ORAL | Status: DC
  Administered 2023-12-31 – 2024-01-03 (×4): 1 via ORAL
  Filled 2023-12-30 (×4): qty 1

## 2023-12-30 MED ORDER — GABAPENTIN 300 MG PO CAPS
300.0000 mg | ORAL_CAPSULE | Freq: Two times a day (BID) | ORAL | Status: DC
  Administered 2023-12-31 – 2024-01-03 (×8): 300 mg via ORAL
  Filled 2023-12-30 (×8): qty 1

## 2023-12-30 MED ORDER — NITROGLYCERIN 0.4 MG/SPRAY TL SOLN: 1.0000 | Status: DC | PRN

## 2023-12-30 MED ORDER — PANTOPRAZOLE SODIUM 40 MG PO TBEC
40.0000 mg | DELAYED_RELEASE_TABLET | Freq: Every day | ORAL | Status: DC
  Administered 2023-12-31 – 2024-01-03 (×4): 40 mg via ORAL
  Filled 2023-12-30 (×4): qty 1

## 2023-12-30 MED ORDER — DM-GUAIFENESIN ER 30-600 MG PO TB12: 1.0000 | ORAL_TABLET | Freq: Two times a day (BID) | ORAL | Status: DC | PRN

## 2023-12-30 MED ORDER — ATORVASTATIN CALCIUM 20 MG PO TABS
40.0000 mg | ORAL_TABLET | Freq: Every day | ORAL | Status: DC
  Administered 2023-12-31 – 2024-01-03 (×5): 40 mg via ORAL
  Filled 2023-12-30 (×5): qty 2

## 2023-12-30 MED ORDER — ACETAMINOPHEN 325 MG PO TABS: 325.0000 mg | ORAL_TABLET | Freq: Four times a day (QID) | ORAL | Status: DC | PRN

## 2023-12-30 MED ORDER — LOSARTAN POTASSIUM 50 MG PO TABS
50.0000 mg | ORAL_TABLET | Freq: Every day | ORAL | Status: DC
  Administered 2023-12-31 – 2024-01-01 (×2): 50 mg via ORAL
  Filled 2023-12-30 (×2): qty 1

## 2023-12-30 MED ORDER — LACTULOSE ENEMA
300.0000 mL | Freq: Once | ORAL | Status: AC
  Administered 2023-12-30: 300 mL via RECTAL
  Filled 2023-12-30: qty 300

## 2023-12-30 MED ORDER — FUROSEMIDE 20 MG PO TABS
20.0000 mg | ORAL_TABLET | Freq: Every day | ORAL | Status: DC
  Administered 2023-12-31 – 2024-01-01 (×2): 20 mg via ORAL
  Filled 2023-12-30 (×2): qty 1

## 2023-12-30 MED ORDER — LACTULOSE 10 GM/15ML PO SOLN
30.0000 g | Freq: Two times a day (BID) | ORAL | Status: DC
Start: 2023-12-31 — End: 2023-12-31
  Administered 2023-12-31: 30 g via ORAL
  Filled 2023-12-30: qty 60

## 2023-12-30 MED ORDER — ASPIRIN 81 MG PO CHEW
81.0000 mg | CHEWABLE_TABLET | Freq: Every day | ORAL | Status: DC
  Administered 2023-12-31 – 2024-01-03 (×4): 81 mg via ORAL
  Filled 2023-12-30 (×4): qty 1

## 2023-12-30 MED ORDER — METHOCARBAMOL 500 MG PO TABS: 500.0000 mg | ORAL_TABLET | Freq: Three times a day (TID) | ORAL | Status: DC | PRN

## 2023-12-30 MED ORDER — NICOTINE 21 MG/24HR TD PT24
21.0000 mg | MEDICATED_PATCH | Freq: Every day | TRANSDERMAL | Status: DC
  Administered 2023-12-30: 21 mg via TRANSDERMAL
  Filled 2023-12-30 (×5): qty 1

## 2023-12-30 MED ORDER — INSULIN ASPART 100 UNIT/ML IJ SOLN: 0.0000 [IU] | Freq: Three times a day (TID) | INTRAMUSCULAR | Status: DC

## 2023-12-30 MED ORDER — INSULIN ASPART 100 UNIT/ML IJ SOLN: 0.0000 [IU] | Freq: Every day | INTRAMUSCULAR | Status: DC

## 2023-12-30 MED ORDER — SODIUM CHLORIDE 0.9 % IV BOLUS
1000.0000 mL | Freq: Once | INTRAVENOUS | Status: AC
  Administered 2023-12-30: 1000 mL via INTRAVENOUS

## 2023-12-30 MED ORDER — MAGNESIUM SULFATE 2 GM/50ML IV SOLN
2.0000 g | Freq: Once | INTRAVENOUS | Status: AC
  Administered 2023-12-30: 2 g via INTRAVENOUS
  Filled 2023-12-30: qty 50

## 2023-12-30 MED ORDER — POTASSIUM CHLORIDE 10 MEQ/100ML IV SOLN: 10.0000 meq | INTRAVENOUS | Status: DC

## 2023-12-30 MED ORDER — LACTULOSE 10 GM/15ML PO SOLN
20.0000 g | Freq: Once | ORAL | Status: DC
  Filled 2023-12-30: qty 30

## 2023-12-30 MED ORDER — QUETIAPINE FUMARATE 25 MG PO TABS
50.0000 mg | ORAL_TABLET | Freq: Every day | ORAL | Status: DC
  Administered 2023-12-31 – 2024-01-02 (×4): 50 mg via ORAL
  Filled 2023-12-30 (×4): qty 2

## 2023-12-30 MED ORDER — SPIRONOLACTONE 25 MG PO TABS
25.0000 mg | ORAL_TABLET | Freq: Every day | ORAL | Status: DC
  Administered 2023-12-31 – 2024-01-01 (×2): 25 mg via ORAL
  Filled 2023-12-30 (×2): qty 1

## 2023-12-30 MED ORDER — RIFAXIMIN 550 MG PO TABS
550.0000 mg | ORAL_TABLET | Freq: Two times a day (BID) | ORAL | Status: DC
  Administered 2023-12-31 – 2024-01-03 (×8): 550 mg via ORAL
  Filled 2023-12-30 (×9): qty 1

## 2023-12-30 NOTE — ED Triage Notes (Signed)
 Arrives from [facility] via [facility] for C/O AMS and weakness.  Last seen normal was this morning at breakfast and patient changed 45 minutes ago. Normally up and walking.    Currently talking, appropriate, but patient having muscles spasms.

## 2023-12-30 NOTE — ED Triage Notes (Signed)
 Patient to ED via [facility] from group home for AMS. Per group home, pt was normally acting this AM at breakfast. Per discharge papers brought with patient, pt recently hospitalized with hepatic encephalopathy.

## 2023-12-30 NOTE — ED Notes (Signed)
 Writer performed hygiene on this pt and placed new sheets and depends on pt. Side rails raised, call light in reach, warm blankets placed onto pt.

## 2023-12-30 NOTE — ED Notes (Signed)
 Rectal tube in place x 2 attempts. First attempt pt pushed tube out with large formed BM. Pericare provided. New brief applied. Rectal tube inserted successfully after second attempt. Pt tolerated well.

## 2023-12-30 NOTE — ED Notes (Signed)
 Swallow screen attempted. Pt failed. Discussed with Xilin Niu, MD. Pt to receive lactulose  enema isntead of PO.

## 2023-12-30 NOTE — ED Notes (Signed)
 This nurse assumed care of pt at this time.

## 2023-12-30 NOTE — ED Provider Notes (Signed)
 [facility] Provider Note    Event Date/Time   First MD Initiated Contact with Patient 12/30/23 1623     (approximate)   History   Altered Mental Status  Arrives from [facility] via [facility] for C/O AMS and weakness.  Last seen normal was this morning at breakfast and patient changed 45 minutes ago. Normally up and walking.    Currently talking, appropriate, but patient having muscles spasms.  Patient to ED via [facility] from group home for AMS. Per group home, pt was normally acting this AM at breakfast. Per discharge papers brought with patient, pt recently hospitalized with hepatic encephalopathy.    HPI [name] is a 73 y.o. male PMH multiple medical comorbidities including hypertension, DM2, COPD, asthma, CAD, HFpEF, CKD presents for evaluation of contractures, altered mental status -Per epic chart review on arrival, multiple prior admissions for hepatic encephalopathy -Patient is a limited historian on my eval.  Does not respond to many questions but does follow basic commands and does speak to me.  Per chart review, patient recently admitted earlier this month for hepatic encephalopathy.  Known history of liver cirrhosis, hepatitis C, hypertension, COPD, hyperlipidemia, diabetes, depression.  Was brought from home for altered mentation.  Ammonia level 174 on admission.  Noted to have bradycardia on arrival.  Proved with fluids and lactulose .  Also treated for UTI.      Physical Exam   Triage Vital Signs: BP (!) 160/75   Pulse (!) 44   Temp 97.8 F (36.6 C) (Oral)   Resp 13   Wt 91.9 kg   SpO2 100%   BMI 32.72 kg/m     Most recent vital signs: Vitals:   12/30/23 1845 12/30/23 1900  BP:  (!) 160/75  Pulse: (!) 45 (!) 44  Resp: 12 13  Temp:    SpO2: 100% 100%     General: Awake, ? Bilateral upper extremity contractures though able to overcome on command, taking airway, responds to basic questions, follows basic commands CV:  Good  peripheral perfusion. RRR, RP 2+ Resp:  Normal effort. CTAB Abd:  No distention. Nontender to deep palpation throughout Neuro:  Alert, unclear orientation, does move all extremities spontaneously.  Minimally interactive with neurologic exam.   ED Results / Procedures / Treatments   Labs (all labs ordered are listed, but only abnormal results are displayed) Labs Reviewed  CBC WITH DIFFERENTIAL/PLATELET - Abnormal; Notable for the following components:      Result Value   WBC 2.7 (*)    RBC 3.40 (*)    Hemoglobin 10.8 (*)    HCT 33.3 (*)    RDW 16.5 (*)    Platelets 107 (*)    Neutro Abs 1.4 (*)    All other components within normal limits  COMPREHENSIVE METABOLIC PANEL WITH GFR - Abnormal; Notable for the following components:   Creatinine, Ser 1.29 (*)    Albumin  2.5 (*)    AST 47 (*)    Total Bilirubin 1.7 (*)    GFR, Estimated 58 (*)    All other components within normal limits  AMMONIA - Abnormal; Notable for the following components:   Ammonia 117 (*)    All other components within normal limits  MAGNESIUM  - Abnormal; Notable for the following components:   Magnesium  1.6 (*)    All other components within normal limits  PROTIME-INR - Abnormal; Notable for the following components:   Prothrombin Time 16.9 (*)    INR 1.4 (*)  All other components within normal limits  PHOSPHORUS  APTT  URINALYSIS, COMPLETE (UACMP) WITH MICROSCOPIC  BRAIN NATRIURETIC PEPTIDE  COMPREHENSIVE METABOLIC PANEL WITH GFR  CBC  AMMONIA     EKG  See ED course below   RADIOLOGY Radiology interpreted by myself and radiology report reviewed.  No acute pathology.  PROCEDURES:  Critical Care performed: No  Procedures   MEDICATIONS ORDERED IN ED: Medications  lactulose  (CHRONULAC ) 10 GM/15ML solution 20 g (20 g Oral Not Given 12/30/23 1802)  lactulose  (CHRONULAC ) enema 200 gm (has no administration in time range)  magnesium  sulfate IVPB 2 g 50 mL (2 g Intravenous New Bag/Given  12/30/23 1924)  nicotine  (NICODERM CQ  - dosed in mg/24 hours) patch 21 mg (21 mg Transdermal Patch Applied 12/30/23 1924)  ondansetron  (ZOFRAN ) injection 4 mg (has no administration in time range)  hydrALAZINE  (APRESOLINE ) injection 5 mg (has no administration in time range)  acetaminophen  (TYLENOL ) tablet 325 mg (has no administration in time range)  dextromethorphan -guaiFENesin  (MUCINEX  DM) 30-600 MG per 12 hr tablet 1 tablet (has no administration in time range)  methocarbamol  (ROBAXIN ) tablet 500 mg (has no administration in time range)  sodium chloride  0.9 % bolus 1,000 mL (0 mLs Intravenous Stopped 12/30/23 1921)     IMPRESSION / MDM / ASSESSMENT AND PLAN / ED COURSE  I reviewed the triage vital signs and the nursing notes.                              DDX/MDM/AP: Differential diagnosis includes, but is not limited to, recurrent hepatic encephalopathy, consider underlying recurrent urinary tract infection contributing to presentation, does not appear to be having seizures, consider underlying electrolyte abnormality.  Doubt stroke on initial eval.  Plan: - Labs  -EKG - CT head - Reassess  Patient's presentation is most consistent with acute presentation with potential threat to life or bodily function.  The patient is on the cardiac monitor to evaluate for evidence of arrhythmia and/or significant heart rate changes.  ED course below.  Workup notable for hyperammonemia, otherwise unremarkable, UA pending.  Suspect hepatic encephalopathy as etiology of presentation.  RN initially unsure if patient able to tolerate p.o., swallow trial pending, if fails will give lactulose  rectally.  Admitted to medicine service.  Clinical Course as of 12/30/23 1943  Mon Dec 30, 2023  1702 Ecg = rate 42, junctional rhythm, no gross ST elevation or depression, no clear evidence of ischemia on my read [MM]  1703 CBC with mild leukopenia (similar to prior), stable anemia [MM]  1704 CMP with mild bump in  creatinine, hepatobiliary labs overall at baseline [MM]  1818 Patient refused to take oral lactulose , will give rectal [MM]  1818 CT head with no acute pathology on my interpretation, formal read pending [MM]  1819 hospitalist consult order placed [MM]    Clinical Course User Index [MM] Collis Deaner, MD     FINAL CLINICAL IMPRESSION(S) / ED DIAGNOSES   Final diagnoses:  Altered mental status, unspecified altered mental status type  Hepatic encephalopathy (HCC)     Rx / DC Orders   ED Discharge Orders     None        Note:  This document was prepared using Dragon voice recognition software and may include unintentional dictation errors.   Collis Deaner, MD 12/30/23 [phone]

## 2023-12-30 NOTE — H&P (Signed)
 History and Physical    [name] NFA:[id number] DOB: Jan 02, 1951 DOA: 12/30/2023  Referring MD/NP/PA:   PCP: [facility]   Patient coming from:  The patient is coming from group home.     Chief Complaint: AMS  HPI: [name] is a 72 y.o. male with medical history significant of hepatitis C and B, liver cirrhosis, hepatic encephalopathy, pancytopenia depression, anxiety, hypertension, hyperlipidemia, diabetes mellitus, COPD, CAD, dCHF, TIA, migraine headache, liver lesion (suspecting cancer), sinus bradycardia, who presents with altered mental status.   Patient has AMS, and is unable to provide accurate medical history, therefore, most of the history is obtained by discussing the case with ED physician, per EMS report, and with the nursing staff.   Per report, pt was LNK at breakfast in this AM . He was noted to be confused in later afternoon. When I saw pt in ED, he is confused, still knows his own name, not oriented to place and time.  He moves all extremities, no facial droop or slurred speech. No active nausea, vomiting, diarrhea noted.  No respiratory distress, active cough, SOB noted.  Does not seem to have pain anywhere. Pt has muscle spasm per nurse report.  Data reviewed independently and ED Course: pt was found to have ammonia 117, magnesium  1.6, pancytopenia with WBC 2.7, hemoglobin 10.8 and platelet 107 (patient had WBC 3.2, hemoglobin 10.1, platelet 73 on/7/25), liver function (ALP 70, AST 47, ALT 34. total bilirubin 1.7), stable renal function, temperature 97.8, blood pressure 153/70, heart rate 45, RR 18, oxygen saturation 99% on room air.  CT of head negative for acute intracranial abnormalities.  Patient is admitted to PCU as inpatient.   EKG: I have personally reviewed.  Bradycardia, heart rate 42, early R wave progression, QTc 403  Review of Systems: Could not be reviewed accurately due to altered mental status.  Allergy:  Allergies  Allergen  Reactions   Bee Venom Anaphylaxis   Codeine  Itching    Only itching. Recently allergy tested at [facility] Pre-F Virus Vaccine Swelling   Meloxicam     Other Reaction(s): ?overdose of local given at dentist--had trouble breathing   Codeine     Novocain [Procaine]    Sulfa Antibiotics Other (See Comments)   Sulfa Antibiotics     hallucinations    Past Medical History:  Diagnosis Date   (HFpEF) heart failure with preserved ejection fraction (HCC)    a.) TTE 03/20/2018: EF 60-65%, no RWMAs, mild LAE, mild-mod MR, PASP 42; b.) TTE 03/31/2021: EF 55-60%, no RWMAs, mild LVH, mild LAE, AoV sclerosis without stenosis; c.) TTE 03/25/2022: EF 60-65%, no RWMAs, AoV sclerosis without stenosis; d.) TTE 12/24/2022: EF 60-65%, no RWMAs, mild LVH, mild LAE.   Anasarca    Anemia    Anxiety    Aortic atherosclerosis (HCC)    Arthritis    Asthma    Bacteremia due to Escherichia coli 05/2023   Bacteremia due to Klebsiella pneumoniae 2023   Bilateral carotid artery disease (HCC)    Bradycardia    CAD (coronary artery disease) 10/21/2006   a.) MV 10/21/2006: small reversible defect involving the apical  lateral wall c/w possible ischemia; b.) MV: 03/24/2018: no ischemia; c.) MV 10/04/2021: LAD calcifications   CAD (coronary artery disease)    Cervical radiculopathy    Chest pain    a.) MV 03/21/2018: no ischemia; b.) MV 10/04/2021: no ischemia   Chronic back pain    Chronic common bile duct  dilatation    Cirrhosis (HCC)    a.) (+) hepatic lesion on CT; likely hepatocellular carcinoma; b.) CT evidence of associated portal hypertension; c.) (+) abdominopelvic anasarca   COPD (chronic obstructive pulmonary disease) (HCC)    DDD (degenerative disc disease), lumbar    Depression    Frequent falls    GERD (gastroesophageal reflux disease)    HBV (hepatitis B virus) infection    HCV (hepatitis C virus)    Hernia of anterior abdominal wall    History of 2019 novel coronavirus disease (COVID-19)  04/16/2020   Homelessness    a.) limited on placement as patient is listed on [place] sex offender listing for exploitation of a minor (possession of child pornography); incarcerated 12 years (released 11/2017)   Hyperlipidemia    Hypertension    Infestation by bed bug 02/2022   Malingering    Migraine    Nephrolithiasis    Obesity    Portal hypertension (HCC)    PTSD (post-traumatic stress disorder)    a.) brief training in the [employer] at age 16; someone was killed in front of him   Pulmonary HTN (HCC)    a.) multiple CT scans desmontrating dilated PA c/w pHTN; b.) TTE 03/20/2018: PASP 42 mmHg   Sleep apnea    a.) no nocturnal PAP therapy   Sleep apnea    Stenosis of cervical spine with myelopathy (HCC)    Suicidal ideations    T2DM (type 2 diabetes mellitus) (HCC)    Thrombocytopenia (HCC)    a.) related to hepatic cirrhosis; spleen normal in size   TIA (transient ischemic attack)    TIA (transient ischemic attack) 09/30/2023    Past Surgical History:  Procedure Laterality Date   ANTERIOR CERVICAL DECOMP/DISCECTOMY FUSION N/A 07/01/2023   Procedure: ANTERIOR CERVICAL DISCECTOMY AND FUSION (FORGE);  Surgeon: Jodeen Munch, MD;  Location: [facility] ORS;  Service: Neurosurgery;  Laterality: N/A;  Keep as last case of the day per Dr Mont Antis   ANTERIOR CERVICAL DISCECTOMY     APPENDECTOMY     CARDIAC CATHETERIZATION     CHOLECYSTECTOMY     EYE SURGERY     INNER EAR SURGERY     NOSE SURGERY     XI ROBOTIC LAPAROSCOPIC ASSISTED APPENDECTOMY N/A 11/05/2022   Procedure: XI ROBOTIC LAPAROSCOPIC ASSISTED APPENDECTOMY;  Surgeon: Eldred Grego, MD;  Location: [facility] ORS;  Service: General;  Laterality: N/A;    Social History:  reports that he has been smoking cigarettes. He has never used smokeless tobacco. He reports that he does not currently use alcohol. He reports that he does not currently use drugs.  Family History:  Family History  Problem Relation Age of Onset    Hypertension Mother    Heart disease Mother    Heart failure Maternal Grandmother      Prior to Admission medications   Medication Sig Start Date End Date Taking? Authorizing Provider  acetaminophen  (TYLENOL ) 650 MG CR tablet Take 650 mg by mouth every 8 (eight) hours as needed for pain.    [provider]  albuterol  (PROVENTIL ) (2.5 MG/3ML) 0.083% nebulizer solution Take 2.5 mg by nebulization every 4 (four) hours as needed for wheezing or shortness of breath. 07/16/23   [provider]  aspirin  81 MG chewable tablet Chew 81 mg by mouth daily.    [provider]  atorvastatin  (LIPITOR) 40 MG tablet Take 40 mg by mouth daily.    [provider]  citalopram  (CELEXA ) 20 MG tablet Take 20  mg by mouth daily.    [provider]  feeding supplement (ENSURE ENLIVE / ENSURE PLUS) LIQD Take 237 mLs by mouth 3 (three) times daily between meals. 10/25/23   Lorita Rosa, MD  furosemide  (LASIX ) 20 MG tablet Take 1 tablet (20 mg total) by mouth daily. 11/07/23   Read Camel, MD  gabapentin  (NEURONTIN ) 300 MG capsule Take 300 mg by mouth 2 (two) times daily. 06/07/23   [provider]  lactulose  (CHRONULAC ) 10 GM/15ML solution Take 45 mLs (30 g total) by mouth 3 (three) times daily. Please titrate to have 2-3 soft bowel movements daily 12/05/23   Amin, Sumayya, MD  Multiple Vitamin (MULTIVITAMIN WITH MINERALS) TABS tablet Take 1 tablet by mouth daily.    [provider]  nitroGLYCERIN  (NITROLINGUAL ) 0.4 MG/SPRAY spray Place 1 spray under the tongue every 5 (five) minutes x 3 doses as needed for chest pain. 10/30/23   Charlette Console, FNP  ondansetron  (ZOFRAN ) 4 MG tablet Take 1 tablet (4 mg total) by mouth every 6 (six) hours as needed for nausea. 06/21/23   Verla Glaze, MD  pantoprazole  (PROTONIX ) 40 MG tablet Take 40 mg by mouth daily. 09/24/23   [provider]  phenol (CHLORASEPTIC) 1.4 % LIQD Use as directed 1 spray in the mouth  or throat as needed for throat irritation / pain.    [provider]  QUEtiapine  (SEROQUEL ) 50 MG tablet Take 50 mg by mouth at bedtime. 11/19/23   [provider]  senna (SENOKOT) 8.6 MG TABS tablet Take 1 tablet (8.6 mg total) by mouth 2 (two) times daily as needed for mild constipation. 07/02/23   Noble Bateman, PA  spironolactone  (ALDACTONE ) 25 MG tablet Take 25 mg by mouth daily. 09/24/23   [provider]  VENTOLIN  HFA 108 (90 Base) MCG/ACT inhaler Inhale 2 puffs into the lungs every 4 (four) hours as needed for wheezing. 07/16/23   [provider]  XIFAXAN  550 MG TABS tablet Take 550 mg by mouth 2 (two) times daily. 09/24/23   [provider]    Physical Exam: Vitals:   12/30/23 1815 12/30/23 1830 12/30/23 1845 12/30/23 1900  BP:  (!) 154/74  (!) 160/75  Pulse: (!) 45 (!) 43 (!) 45 (!) 44  Resp: 18 12 12 13   Temp:      TempSrc:      SpO2: 99% 100% 100% 100%  Weight:       General: Not in acute distress HEENT:       Eyes: PERRL, EOMI, no jaundice       ENT: No discharge from the ears and nose       Neck: No JVD, no bruit, no mass felt. Heme: No neck lymph node enlargement. Cardiac: S1/S2, RRR, No murmurs, No gallops or rubs. Respiratory: No rales, wheezing, rhonchi or rubs. GI: Soft, nondistended, nontender, no organomegaly, BS present. GU: No hematuria Ext: Has trace leg edema bilaterally. 1+DP/PT pulse bilaterally. Musculoskeletal: No joint deformities, No joint redness or warmth, no limitation of ROM in spin. Skin: No rashes.  Neuro: Confused, knows his own name, not orientated to place and time, cranial nerves II-XII grossly intact, moves all extremities Psych: Patient is not psychotic, no suicidal or hemocidal ideation.  Labs on Admission: I have personally reviewed following labs and imaging studies  CBC: Recent Labs  Lab 12/30/23 1631  WBC 2.7*  NEUTROABS 1.4*  HGB 10.8*  HCT 33.3*  MCV 97.9  PLT 107*   Basic  Metabolic Panel: Recent Labs  Lab 12/30/23 1631  NA 142  K 4.0  CL 109  CO2 26  GLUCOSE 86  BUN 23  CREATININE 1.29*  CALCIUM  9.7  MG 1.6*   GFR: Estimated Creatinine Clearance: 53.3 mL/min (A) (by C-G formula based on SCr of 1.29 mg/dL (H)). Liver Function Tests: Recent Labs  Lab 12/30/23 1631  AST 47*  ALT 34  ALKPHOS 70  BILITOT 1.7*  PROT 6.6  ALBUMIN  2.5*   No results for input(s): "LIPASE", "AMYLASE" in the last 168 hours. Recent Labs  Lab 12/30/23 1631  AMMONIA 117*   Coagulation Profile: No results for input(s): "INR", "PROTIME" in the last 168 hours. Cardiac Enzymes: No results for input(s): "CKTOTAL", "CKMB", "CKMBINDEX", "TROPONINI" in the last 168 hours. BNP (last 3 results) No results for input(s): "PROBNP" in the last 8760 hours. HbA1C: No results for input(s): "HGBA1C" in the last 72 hours. CBG: No results for input(s): "GLUCAP" in the last 168 hours. Lipid Profile: No results for input(s): "CHOL", "HDL", "LDLCALC", "TRIG", "CHOLHDL", "LDLDIRECT" in the last 72 hours. Thyroid  Function Tests: No results for input(s): "TSH", "T4TOTAL", "FREET4", "T3FREE", "THYROIDAB" in the last 72 hours. Anemia Panel: No results for input(s): "VITAMINB12", "FOLATE", "FERRITIN", "TIBC", "IRON", "RETICCTPCT" in the last 72 hours. Urine analysis:    Component Value Date/Time   COLORURINE YELLOW (A) 11/29/2023 1638   APPEARANCEUR HAZY (A) 11/29/2023 1638   LABSPEC 1.013 11/29/2023 1638   PHURINE 5.0 11/29/2023 1638   GLUCOSEU NEGATIVE 11/29/2023 1638   HGBUR NEGATIVE 11/29/2023 1638   BILIRUBINUR NEGATIVE 11/29/2023 1638   KETONESUR NEGATIVE 11/29/2023 1638   PROTEINUR NEGATIVE 11/29/2023 1638   NITRITE NEGATIVE 11/29/2023 1638   LEUKOCYTESUR MODERATE (A) 11/29/2023 1638   Sepsis Labs: @LABRCNTIP (procalcitonin:4,lacticidven:4) )No results found for this or any previous visit (from the past 240 hours).   Radiological Exams on  Admission:   Assessment/Plan Principal Problem:   Hepatic encephalopathy (HCC) Active Problems:   Liver cirrhosis (HCC)   Essential hypertension   Sinus bradycardia   CAD (coronary artery disease)   Pancytopenia (HCC)   COPD (chronic obstructive pulmonary disease) (HCC)   Type II diabetes mellitus with renal manifestations (HCC)   Chronic kidney disease, stage 3a (HCC)   Chronic diastolic CHF (congestive heart failure) (HCC)   Hypomagnesemia   Depression with anxiety   Obesity (BMI 30-39.9)   Assessment and Plan:  Hepatic encephalopathy (HCC): Patient's altered mental status is likely due to hepatic encephalopathy.  CT head negative.  Ammonia 117.   -Admitted to PCU as inpatient -Frequent neurocheck -Fall precaution -Lactulose : give one dose by Enema, then orally 30 g bid in AM. -f/u UA   HTN (hypertension): -IV hydralazine  as needed -Cozarr -pt is on  lasix , spironolactone     Chronic diastolic CHF (congestive heart failure) (HCC): 2D echo on 12/24/2022 showed EF 60 to 65%.  Has has trace leg edema, no JVD.  No oxygen desaturation.  CHF seem to be compensated. - Lasix  and spironolactone  -Check BNP   COPD (chronic obstructive pulmonary disease) (HCC): No wheezing - Bronchodilators and as needed Mucinex    MDD (major depressive disorder): -Continue home oral medications (Celexa  and Seroquel )   HLD (hyperlipidemia) -Hold Lipitor   Diet-controled diabetes mellitus without complication (HCC): Recent A1c 4.9, well-controlled.  Pt is taking jardiance  -SSI   Pancytopenia: This is chronic issue.  Hemoglobin is closed to baseline -Follow CBC    Liver cirrhosis (HCC):  -check INR/PTT -Repeat ammonia level in the morning -  Lasix , rifaximin  and spironolactone     CAD (coronary artery disease) and myocardial injury:  -Will continue aspirin  and Lipitor   Chronic kidney disease, stage 3a (HCC): Renal function stable.  Recent baseline creatinine 1.0-1.4.  His creatinine is  1.29, BUN 23 GFR 58 -Follow up by BMP  Sinus bradycardia: this is chronic issue.  Heart rate 45.  Hemodynamically stable. - Avoid using nodal blockers  Hypomagnesemia: Magnesium  1.6.  Potassium 4.0 - Repleted magnesium  - Check phosphorus level  Obesity (BMI 30-39.9): Body weight 91.9 kg, BMI 32.72 - Encourage losing weight - Exercise and healthy diet        DVT ppx: SCD  Code Status: DNR   Family Communication:     not done, no family member is at bed side.      Disposition Plan:  Anticipate discharge back to previous environment, group home  Consults called:  none  Admission status and Level of care: Progressive: as inpt        Dispo: The patient is from: Group home              Anticipated d/c is to: Group home              Anticipated d/c date is: 2 days              Patient currently is not medically stable to d/c.    Severity of Illness:  The appropriate patient status for this patient is INPATIENT. Inpatient status is judged to be reasonable and necessary in order to provide the required intensity of service to ensure the patient's safety. The patient's presenting symptoms, physical exam findings, and initial radiographic and laboratory data in the context of their chronic comorbidities is felt to place them at high risk for further clinical deterioration. Furthermore, it is not anticipated that the patient will be medically stable for discharge from the hospital within 2 midnights of admission.   * I certify that at the point of admission it is my clinical judgment that the patient will require inpatient hospital care spanning beyond 2 midnights from the point of admission due to high intensity of service, high risk for further deterioration and high frequency of surveillance required.*       Date of Service 12/30/2023    Fidencio Hue [facility]   If 7PM-7AM, please contact night-coverage [email or web address] 12/30/2023, 7:27 PM

## 2023-12-30 NOTE — ED Notes (Signed)
 This RN gave report to [facility] and performed care handoff. Call light in reach, bed wheels locked, side rails raised, pt updated on plan of care. Rounding completed.

## 2023-12-31 DIAGNOSIS — R001 Bradycardia, unspecified: Secondary | ICD-10-CM | POA: Diagnosis not present

## 2023-12-31 DIAGNOSIS — K7682 Hepatic encephalopathy: Secondary | ICD-10-CM | POA: Diagnosis not present

## 2023-12-31 DIAGNOSIS — K7460 Unspecified cirrhosis of liver: Secondary | ICD-10-CM | POA: Diagnosis not present

## 2023-12-31 DIAGNOSIS — E1122 Type 2 diabetes mellitus with diabetic chronic kidney disease: Secondary | ICD-10-CM

## 2023-12-31 DIAGNOSIS — I10 Essential (primary) hypertension: Secondary | ICD-10-CM | POA: Diagnosis not present

## 2023-12-31 LAB — COMPREHENSIVE METABOLIC PANEL WITH GFR
ALT: 29 U/L (ref 0–44)
AST: 42 U/L — ABNORMAL HIGH (ref 15–41)
Albumin: 2.1 g/dL — ABNORMAL LOW (ref 3.5–5.0)
Alkaline Phosphatase: 54 U/L (ref 38–126)
Anion gap: 6 (ref 5–15)
BUN: 23 mg/dL (ref 8–23)
CO2: 22 mmol/L (ref 22–32)
Calcium: 8.9 mg/dL (ref 8.9–10.3)
Chloride: 111 mmol/L (ref 98–111)
Creatinine, Ser: 1.15 mg/dL (ref 0.61–1.24)
GFR, Estimated: >60 mL/min (ref >60)
Glucose, Bld: 87 mg/dL (ref 70–99)
Potassium: 3.4 mmol/L — ABNORMAL LOW (ref 3.5–5.1)
Sodium: 139 mmol/L (ref 135–145)
Total Bilirubin: 1.6 mg/dL — ABNORMAL HIGH (ref 0.0–1.2)
Total Protein: 5.5 g/dL — ABNORMAL LOW (ref 6.5–8.1)

## 2023-12-31 LAB — URINALYSIS, COMPLETE (UACMP) WITH MICROSCOPIC
Bilirubin Urine: NEGATIVE
Glucose, UA: NEGATIVE mg/dL
Hgb urine dipstick: NEGATIVE
Ketones, ur: NEGATIVE mg/dL
Nitrite: NEGATIVE
Protein, ur: NEGATIVE mg/dL
Specific Gravity, Urine: 1.015 (ref 1.005–1.030)
Squamous Epithelial / HPF: 0 /HPF (ref 0–5)
pH: 6 (ref 5.0–8.0)

## 2023-12-31 LAB — CBC
HCT: 27.4 % — ABNORMAL LOW (ref 39.0–52.0)
Hemoglobin: 9.2 g/dL — ABNORMAL LOW (ref 13.0–17.0)
MCH: 32.9 pg (ref 26.0–34.0)
MCHC: 33.6 g/dL (ref 30.0–36.0)
MCV: 97.9 fL (ref 80.0–100.0)
Platelets: 90 10*3/uL — ABNORMAL LOW (ref 150–400)
RBC: 2.8 MIL/uL — ABNORMAL LOW (ref 4.22–5.81)
RDW: 16.4 % — ABNORMAL HIGH (ref 11.5–15.5)
WBC: 2 10*3/uL — ABNORMAL LOW (ref 4.0–10.5)
nRBC: 0 % (ref 0.0–0.2)

## 2023-12-31 LAB — CBG MONITORING, ED
Glucose-Capillary: 102 mg/dL — ABNORMAL HIGH (ref 70–99)
Glucose-Capillary: 108 mg/dL — ABNORMAL HIGH (ref 70–99)
Glucose-Capillary: 74 mg/dL (ref 70–99)
Glucose-Capillary: 80 mg/dL (ref 70–99)
Glucose-Capillary: 89 mg/dL (ref 70–99)

## 2023-12-31 LAB — MAGNESIUM: Magnesium: 1.7 mg/dL (ref 1.7–2.4)

## 2023-12-31 LAB — GLUCOSE, CAPILLARY: Glucose-Capillary: 93 mg/dL (ref 70–99)

## 2023-12-31 LAB — AMMONIA: Ammonia: 85 umol/L — ABNORMAL HIGH (ref 9–35)

## 2023-12-31 MED ORDER — CHLORHEXIDINE GLUCONATE CLOTH 2 % EX PADS
6.0000 | MEDICATED_PAD | Freq: Every day | CUTANEOUS | Status: DC
  Administered 2024-01-01 – 2024-01-03 (×3): 6 via TOPICAL

## 2023-12-31 MED ORDER — LACTULOSE 10 GM/15ML PO SOLN
30.0000 g | Freq: Three times a day (TID) | ORAL | Status: DC
  Administered 2023-12-31 – 2024-01-02 (×6): 30 g via ORAL
  Filled 2023-12-31 (×8): qty 60

## 2023-12-31 MED ORDER — KETOROLAC TROMETHAMINE 15 MG/ML IJ SOLN
15.0000 mg | Freq: Three times a day (TID) | INTRAMUSCULAR | Status: DC | PRN
Start: 2023-12-31 — End: 2024-01-05
  Administered 2023-12-31: 15 mg via INTRAVENOUS
  Filled 2023-12-31: qty 1

## 2023-12-31 MED ORDER — POTASSIUM CHLORIDE CRYS ER 20 MEQ PO TBCR
40.0000 meq | EXTENDED_RELEASE_TABLET | Freq: Once | ORAL | Status: AC
  Administered 2023-12-31: 40 meq via ORAL
  Filled 2023-12-31: qty 2

## 2023-12-31 MED ORDER — MORPHINE SULFATE (PF) 2 MG/ML IV SOLN
2.0000 mg | INTRAVENOUS | Status: DC | PRN
  Administered 2023-12-31: 2 mg via INTRAVENOUS
  Filled 2023-12-31: qty 1

## 2023-12-31 MED ORDER — MAGNESIUM SULFATE 2 GM/50ML IV SOLN
2.0000 g | Freq: Once | INTRAVENOUS | Status: AC
  Administered 2023-12-31: 2 g via INTRAVENOUS
  Filled 2023-12-31: qty 50

## 2023-12-31 NOTE — ED Notes (Signed)
 Pt stating that he was feeling shaky. BG checked. WNL with reading of 89. Provided drink at bedside.

## 2023-12-31 NOTE — ED Notes (Addendum)
 Pt has PO medications ordered. Discussed previous swallow eval attempt with Fidencio Hue, MD. Another swallow eval attempted at this time. Pt passed. Was able to take PO meds without difficulty at this time.

## 2023-12-31 NOTE — Assessment & Plan Note (Signed)
 Seems chronic and at baseline -Continue to monitor

## 2023-12-31 NOTE — Hospital Course (Addendum)
 Taken from H&P.  [name] is a 73 y.o. male with medical history significant of hepatitis C and B, liver cirrhosis, hepatic encephalopathy, pancytopenia depression, anxiety, hypertension, hyperlipidemia, diabetes mellitus, COPD, CAD, dCHF, TIA, migraine headache, liver lesion (suspecting cancer), sinus bradycardia, who presents with altered mental status.   Patient with multiple recent hospitalization for hepatic encephalopathy.  On presentation vitals stable except mild asymptomatic bradycardia, labs with ammonia of 117, magnesium  1.6, pancytopenia with WBC 2.7, hemoglobin 10.8 and platelet 107 which seems stable.  CT head negative for any acute intracranial abnormality.  Patient was admitted for hepatic encephalopathy.  5/6: Vital stable with heart rate in mid 40s, ammonia improved to 85, CBC with worsening pancytopenia, potassium of 3.4 and stable T. Bili, UA with small amount of leukocytes, BNP at 381, magnesium  at 1.7-giving more magnesium  and replating potassium.

## 2023-12-31 NOTE — Assessment & Plan Note (Signed)
 2D echo on 12/24/2022 showed EF 60 to 65%.  Has has trace leg edema, no JVD.  No oxygen desaturation.  CHF seem to be compensated.  BNP mildly elevated at 381 - Continue Lasix  and spironolactone 

## 2023-12-31 NOTE — Assessment & Plan Note (Signed)
 Recurrent hospitalization for the same reason.  CT head negative, mentation started improving with improving ammonia now. Per patient he is compliant with home lactulose  but does not get daily bowel movements. -Increasing the dose of lactulose  to 3 times daily-need to titrate for at least 2 soft bowel movements daily.

## 2023-12-31 NOTE — Assessment & Plan Note (Signed)
 Estimated body mass index is 32.72 kg/m as calculated from the following:   Height as of 12/21/23: 5\' 6"  (1.676 m).   Weight as of this encounter: 91.9 kg.   - Encouraged weight loss

## 2023-12-31 NOTE — Assessment & Plan Note (Signed)
 No concern of exacerbation. - Continue with bronchodilator and supportive therapy

## 2023-12-31 NOTE — Assessment & Plan Note (Signed)
 Diet control.  A1c of 4.9. Patient was on Jardiance  at home -SSI if needed

## 2023-12-31 NOTE — Assessment & Plan Note (Signed)
 Asymptomatic and chronic -Avoid nodal blocking agents

## 2023-12-31 NOTE — Assessment & Plan Note (Signed)
 Liver function and INR at 1.4 which is around his baseline. -Continue Lasix , rifaximin  and spironolactone 

## 2023-12-31 NOTE — Assessment & Plan Note (Signed)
 Renal function stable.  Recent baseline creatinine 1.0-1.4.  - Monitor renal function -Avoid nephrotoxins

## 2023-12-31 NOTE — Assessment & Plan Note (Signed)
-   Continue home Celexa  and Seroquel

## 2023-12-31 NOTE — Assessment & Plan Note (Signed)
 No chest pain - Continue home aspirin  and Lipitor

## 2023-12-31 NOTE — Progress Notes (Signed)
 Progress Note   Patient: [name] MVH:[id number] DOB: 10/12/1950 DOA: 12/30/2023     1 DOS: the patient was seen and examined on 12/31/2023   Brief hospital course: Taken from H&P.  GEARALD BOCKUS is a 73 y.o. male with medical history significant of hepatitis C and B, liver cirrhosis, hepatic encephalopathy, pancytopenia depression, anxiety, hypertension, hyperlipidemia, diabetes mellitus, COPD, CAD, dCHF, TIA, migraine headache, liver lesion (suspecting cancer), sinus bradycardia, who presents with altered mental status.   Patient with multiple recent hospitalization for hepatic encephalopathy.  On presentation vitals stable except mild asymptomatic bradycardia, labs with ammonia of 117, magnesium  1.6, pancytopenia with WBC 2.7, hemoglobin 10.8 and platelet 107 which seems stable.  CT head negative for any acute intracranial abnormality.  Patient was admitted for hepatic encephalopathy.  5/6: Vital stable with heart rate in mid 40s, ammonia improved to 85, CBC with worsening pancytopenia, potassium of 3.4 and stable T. Bili, UA with small amount of leukocytes, BNP at 381, magnesium  at 1.7-giving more magnesium  and replating potassium.  Assessment and Plan: * Hepatic encephalopathy (HCC) Recurrent hospitalization for the same reason.  CT head negative, mentation started improving with improving ammonia now. Per patient he is compliant with home lactulose  but does not get daily bowel movements. -Increasing the dose of lactulose  to 3 times daily-need to titrate for at least 2 soft bowel movements daily.  Liver cirrhosis (HCC) Liver function and INR at 1.4 which is around his baseline. -Continue Lasix , rifaximin  and spironolactone   Essential hypertension Blood pressure within goal. -Continue Cozaar , Lasix  and spironolactone   CAD (coronary artery disease) No chest pain - Continue home aspirin  and Lipitor  Sinus bradycardia Asymptomatic and chronic -Avoid nodal blocking  agents  Pancytopenia (HCC) Seems chronic and at baseline -Continue to monitor  Type II diabetes mellitus with renal manifestations (HCC) Diet control.  A1c of 4.9. Patient was on Jardiance  at home -SSI if needed  COPD (chronic obstructive pulmonary disease) (HCC) No concern of exacerbation. - Continue with bronchodilator and supportive therapy  Chronic kidney disease, stage 3a (HCC) Renal function stable.  Recent baseline creatinine 1.0-1.4.  - Monitor renal function -Avoid nephrotoxins  Chronic diastolic CHF (congestive heart failure) (HCC)  2D echo on 12/24/2022 showed EF 60 to 65%.  Has has trace leg edema, no JVD.  No oxygen desaturation.  CHF seem to be compensated.  BNP mildly elevated at 381 - Continue Lasix  and spironolactone   Hypomagnesemia Magnesium  with some improvement to 1.7 but remained borderline low.  Also found to have mild hypokalemia which is being repleted - Giving more magnesium  and replating potassium  Depression with anxiety - Continue home Celexa  and Seroquel   Obesity (BMI 30-39.9) Estimated body mass index is 32.72 kg/m as calculated from the following:   Height as of 12/21/23: 5\' 6"  (1.676 m).   Weight as of this encounter: 91.9 kg.   - Encouraged weight loss   Subjective: Patient was feeling improved and oriented x 2 when seen today.  Per patient he was taking his home lactulose  but does not get daily bowel movement.  Physical Exam: Vitals:   12/31/23 1330 12/31/23 1352 12/31/23 1400 12/31/23 1430  BP: 105/85  132/69 120/64  Pulse: 100  (!) 43 (!) 44  Resp: 17  15 15   Temp:  97.9 F (36.6 C)    TempSrc:  Oral    SpO2: 100%  100% 100%  Weight:       General.  Obese elderly man, in no acute distress. Pulmonary.  Lungs clear bilaterally, normal respiratory effort. CV.  Regular rate and rhythm, no JVD, rub or murmur. Abdomen.  Soft, nontender, nondistended, BS positive. CNS.  Alert and oriented x 2.  No focal neurologic  deficit. Extremities.  No edema, no cyanosis, pulses intact and symmetrical.   Data Reviewed: Prior data reviewed  Family Communication: Tried calling a friend listed in his contact with no response.  No family member listed.  Disposition: Status is: Inpatient Remains inpatient appropriate because: Severity of illness  Planned Discharge Destination: Home  Time spent: 50 minutes  This record has been created using Conservation officer, historic buildings. Errors have been sought and corrected,but may not always be located. Such creation errors do not reflect on the standard of care.   Author: Luna Salinas, MD 12/31/2023 3:05 PM  For on call review [email or web address].

## 2023-12-31 NOTE — Assessment & Plan Note (Signed)
 Magnesium  with some improvement to 1.7 but remained borderline low.  Also found to have mild hypokalemia which is being repleted - Giving more magnesium  and replating potassium

## 2023-12-31 NOTE — Assessment & Plan Note (Signed)
 Blood pressure within goal. -Continue Cozaar , Lasix  and spironolactone 

## 2024-01-01 DIAGNOSIS — K7682 Hepatic encephalopathy: Secondary | ICD-10-CM | POA: Diagnosis not present

## 2024-01-01 LAB — BASIC METABOLIC PANEL WITH GFR
Anion gap: 5 (ref 5–15)
BUN: 26 mg/dL — ABNORMAL HIGH (ref 8–23)
CO2: 21 mmol/L — ABNORMAL LOW (ref 22–32)
Calcium: 8.9 mg/dL (ref 8.9–10.3)
Chloride: 111 mmol/L (ref 98–111)
Creatinine, Ser: 1.35 mg/dL — ABNORMAL HIGH (ref 0.61–1.24)
GFR, Estimated: 55 mL/min — ABNORMAL LOW (ref >60)
Glucose, Bld: 70 mg/dL (ref 70–99)
Potassium: 3.9 mmol/L (ref 3.5–5.1)
Sodium: 137 mmol/L (ref 135–145)

## 2024-01-01 LAB — GLUCOSE, CAPILLARY
Glucose-Capillary: 78 mg/dL (ref 70–99)
Glucose-Capillary: 60 mg/dL — ABNORMAL LOW (ref 70–99)
Glucose-Capillary: 72 mg/dL (ref 70–99)
Glucose-Capillary: 149 mg/dL — ABNORMAL HIGH (ref 70–99)
Glucose-Capillary: 108 mg/dL — ABNORMAL HIGH (ref 70–99)

## 2024-01-01 LAB — MAGNESIUM: Magnesium: 2 mg/dL (ref 1.7–2.4)

## 2024-01-01 MED ORDER — BLISTEX MEDICATED EX OINT
1.0000 | TOPICAL_OINTMENT | CUTANEOUS | Status: DC | PRN
  Filled 2024-01-01: qty 6.3

## 2024-01-01 NOTE — Plan of Care (Signed)

## 2024-01-01 NOTE — TOC Progression Note (Signed)
 Transition of Care [facility]) - Progression Note    Patient Details  Name: [name] MRN: [id number] Date of Birth: February 18, 1951  Transition of Care [facility]) CM/SW Contact  Baird Bombard, RN Phone Number: 01/01/2024, 4:20 PM  Clinical Narrative:    Attempt to complete risk assessment. Patient sleeping.         Expected Discharge Plan and Services                                               Social Determinants of Health (SDOH) Interventions SDOH Screenings   Food Insecurity: No Food Insecurity (11/29/2023)  Housing: Low Risk  (11/29/2023)  Transportation Needs: No Transportation Needs (11/29/2023)  Utilities: Not At Risk (11/29/2023)  Alcohol Screen: Low Risk  (09/24/2022)  Depression (PHQ2-9): High Risk (04/26/2022)  Financial Resource Strain: High Risk (04/27/2022)  Physical Activity: Sufficiently Active (04/16/2018)  Social Connections: Socially Isolated (11/29/2023)  Stress: Stress Concern Present (04/16/2018)  Tobacco Use: High Risk (12/30/2023)    Readmission Risk Interventions    11/29/2023    5:03 PM 11/13/2023    2:21 PM 11/12/2023    4:00 PM  Readmission Risk Prevention Plan  Transportation Screening Not Complete  Not Complete  Medication Review Oceanographer) Not Complete Complete Not Complete  PCP or Specialist appointment within 3-5 days of discharge Not Complete Complete Complete  HRI or Home Care Consult Not Complete Complete   SW Recovery Care/Counseling Consult Not Complete Complete   Palliative Care Screening Not Applicable Complete   Skilled Nursing Facility Not Applicable Not Applicable

## 2024-01-01 NOTE — Progress Notes (Signed)
 Progress Note   Patient: [name] AVW:[id number] DOB: November 22, 1950 DOA: 12/30/2023     2 DOS: the patient was seen and examined on 01/01/2024   Brief hospital course: [name] is a 73 y.o. male with medical history significant of hepatitis C and B, liver cirrhosis, hepatic encephalopathy, pancytopenia depression, anxiety, hypertension, hyperlipidemia, diabetes mellitus, COPD, CAD, dCHF, TIA, migraine headache, liver lesion (suspecting cancer), sinus bradycardia, who presents with altered mental status.    Patient with multiple recent hospitalization for hepatic encephalopathy.   On presentation vitals stable except mild asymptomatic bradycardia, labs with ammonia of 117, magnesium  1.6, pancytopenia with WBC 2.7, hemoglobin 10.8 and platelet 107 which seems stable.  CT head negative for any acute intracranial abnormality.   Patient was admitted for hepatic encephalopathy.   5/6: Vital stable with heart rate in mid 40s, ammonia improved to 85, CBC with worsening pancytopenia, potassium of 3.4 and stable T. Bili, UA with small amount of leukocytes, BNP at 381, magnesium  at 1.7-giving more magnesium  and replating potassium.  5/7 : Awake and alert but very weak.  Assessment and Plan:  * Hepatic encephalopathy (HCC) Liver cirrhosis Recurrent hospitalization for the same reason.   CT head negative, mentation started improving with improving ammonia level. Per patient he is compliant with home lactulose  but does not get daily bowel movements. - Continue lactulose  30 g 3 times daily - Continue rifaximin  -Hold Lasix , and spironolactone     Essential hypertension Currently hypotensive Hold Cozaar , Lasix  and spironolactone     CAD (coronary artery disease) No chest pain - Continue home aspirin  and Lipitor   Sinus bradycardia Asymptomatic and chronic -Avoid nodal blocking agents   Pancytopenia (HCC) Seems chronic and at baseline -Continue to monitor   Type II diabetes mellitus  with renal manifestations (HCC) Diet control.  A1c of 4.9. Episodes of hypoglycemia Discontinue insulin  Continue consistent carbohydrate diet    COPD (chronic obstructive pulmonary disease) (HCC) No concern of exacerbation. - Continue with bronchodilator and supportive therapy   Chronic kidney disease, stage 3a (HCC) Renal function stable.  Recent baseline creatinine 1.0-1.4.  - Monitor renal function -Avoid nephrotoxins   Chronic diastolic CHF (congestive heart failure) (HCC)  2D echo on 12/24/2022 showed EF 60 to 65%.  Has has trace leg edema, no JVD.  No oxygen desaturation.  CHF seem to be compensated.  BNP mildly elevated at 381 - Continue Lasix  and spironolactone    Hypomagnesemia Magnesium  with some improvement to 1.7 but remained borderline low.  Also found to have mild hypokalemia which is being repleted - Supplement magnesium  and potassium   Depression with anxiety - Continue home Celexa  and Seroquel    Obesity (BMI 30-39.9) Estimated body mass index is 32.72 kg/m as calculated from the following:   Height as of 12/21/23: 5\' 6"  (1.676 m).   Weight as of this encounter: 91.9 kg.    - Encouraged weight loss         Subjective: No new complaints  Physical Exam: Vitals:   01/01/24 0745 01/01/24 1102 01/01/24 1456 01/01/24 1500  BP: (!) 112/59 108/65 (!) 83/42 (!) 85/50  Pulse:  (!) 43 (!) 43   Resp:  19 20   Temp:  97.9 F (36.6 C) 97.7 F (36.5 C)   TempSrc:      SpO2:  100% 100%   Weight:      Height:       General.  Obese elderly man, in no acute distress. Pulmonary.  Lungs clear bilaterally, normal respiratory  effort. CV.  Regular rate and rhythm, no JVD, rub or murmur. Abdomen.  Soft, nontender, nondistended, BS positive. CNS.  Alert and oriented x 2.  No focal neurologic deficit. Extremities.  No edema, no cyanosis, pulses intact and symmetrical.     Data Reviewed: Labs reviewed.  Serum creatinine 1.35 Labs reviewed  Family Communication:  Plan of care discussed with patient  Disposition: Status is: Inpatient Remains inpatient appropriate because: Treatment for hepatic encephalopathy  Planned Discharge Destination:  TBD    Time spent: 38  minutes  Author: Read Camel, MD 01/01/2024 3:17 PM  For on call review [email or web address].

## 2024-01-01 NOTE — Progress Notes (Signed)
 Pt complained of left sided chest pain during admission, around 2030H. Pain scale is 8/10. 12L EKG done and informed NP Ouma via secure chat. EKG result relayed. Morphine  2mg  IV given.

## 2024-01-01 NOTE — Progress Notes (Signed)
 Transition of Care [facility]) - Inpatient Brief Assessment   Patient Details  Name: [name] MRN: [id number] Date of Birth: July 08, 1951  Transition of Care [facility]) CM/SW Contact:    Murrell Dome C Rayshun Kandler, RN Phone Number: 01/01/2024, 1:52 PM   Clinical Narrative: TOC continuing to follow patient's progress throughout discharge planning.   Transition of Care Asessment:   Patient has primary care physician: Yes   Prior level of function:: Independent Prior/Current Home Services: No current home services Social Drivers of Health Review: SDOH reviewed no interventions necessary Readmission risk has been reviewed: Yes Transition of care needs: no transition of care needs at this time

## 2024-01-02 DIAGNOSIS — K7682 Hepatic encephalopathy: Secondary | ICD-10-CM | POA: Diagnosis not present

## 2024-01-02 LAB — BASIC METABOLIC PANEL WITH GFR
Anion gap: 7 (ref 5–15)
BUN: 28 mg/dL — ABNORMAL HIGH (ref 8–23)
CO2: 23 mmol/L (ref 22–32)
Calcium: 9 mg/dL (ref 8.9–10.3)
Chloride: 109 mmol/L (ref 98–111)
Creatinine, Ser: 1.39 mg/dL — ABNORMAL HIGH (ref 0.61–1.24)
GFR, Estimated: 53 mL/min — ABNORMAL LOW (ref >60)
Glucose, Bld: 63 mg/dL — ABNORMAL LOW (ref 70–99)
Potassium: 4.2 mmol/L (ref 3.5–5.1)
Sodium: 139 mmol/L (ref 135–145)

## 2024-01-02 LAB — GLUCOSE, CAPILLARY
Glucose-Capillary: 80 mg/dL (ref 70–99)
Glucose-Capillary: 117 mg/dL — ABNORMAL HIGH (ref 70–99)
Glucose-Capillary: 84 mg/dL (ref 70–99)
Glucose-Capillary: 161 mg/dL — ABNORMAL HIGH (ref 70–99)

## 2024-01-02 MED ORDER — ENSURE ENLIVE PO LIQD
237.0000 mL | Freq: Two times a day (BID) | ORAL | Status: DC
  Administered 2024-01-02 – 2024-01-03 (×3): 237 mL via ORAL

## 2024-01-02 NOTE — TOC Progression Note (Signed)
 Transition of Care [facility]) - Progression Note    Patient Details  Name: STORY BRAKEMAN MRN: [id number] Date of Birth: 12/30/1950  Transition of Care [facility]) CM/SW Contact  Baird Bombard, RN Phone Number: 01/02/2024, 4:26 PM  Clinical Narrative:    Eldora Greet with [facility] , Group Home Owner. Patient can return to facility tomorrow.         Expected Discharge Plan and Services                                               Social Determinants of Health (SDOH) Interventions SDOH Screenings   Food Insecurity: No Food Insecurity (11/29/2023)  Housing: Low Risk  (11/29/2023)  Transportation Needs: No Transportation Needs (11/29/2023)  Utilities: Not At Risk (11/29/2023)  Alcohol Screen: Low Risk  (09/24/2022)  Depression (PHQ2-9): High Risk (04/26/2022)  Financial Resource Strain: High Risk (04/27/2022)  Physical Activity: Sufficiently Active (04/16/2018)  Social Connections: Socially Isolated (11/29/2023)  Stress: Stress Concern Present (04/16/2018)  Tobacco Use: High Risk (12/30/2023)    Readmission Risk Interventions    11/29/2023    5:03 PM 11/13/2023    2:21 PM 11/12/2023    4:00 PM  Readmission Risk Prevention Plan  Transportation Screening Not Complete  Not Complete  Medication Review Oceanographer) Not Complete Complete Not Complete  PCP or Specialist appointment within 3-5 days of discharge Not Complete Complete Complete  HRI or Home Care Consult Not Complete Complete   SW Recovery Care/Counseling Consult Not Complete Complete   Palliative Care Screening Not Applicable Complete   Skilled Nursing Facility Not Applicable Not Applicable

## 2024-01-02 NOTE — Progress Notes (Signed)
 Progress Note   Patient: [name]:[id number] DOB: [birth year] DOA: 12/30/2023     3 DOS: the patient was seen and examined on 01/02/2024   Brief hospital course:  [name] is a 73 y.o. male with medical history significant of hepatitis C and B, liver cirrhosis, hepatic encephalopathy, pancytopenia depression, anxiety, hypertension, hyperlipidemia, diabetes mellitus, COPD, CAD, dCHF, TIA, migraine headache, liver lesion (suspecting cancer), sinus bradycardia, who presents with altered mental status.    Patient with multiple recent hospitalization for hepatic encephalopathy.   On presentation vitals stable except mild asymptomatic bradycardia, labs with ammonia of 117, magnesium  1.6, pancytopenia with WBC 2.7, hemoglobin 10.8 and platelet 107 which seems stable.  CT head negative for any acute intracranial abnormality.   Patient was admitted for hepatic encephalopathy.   5/6: Vital stable with heart rate in mid 40s, ammonia improved to 85, CBC with worsening pancytopenia, potassium of 3.4 and stable T. Bili, UA with small amount of leukocytes, BNP at 381, magnesium  at 1.7-giving more magnesium  and replating potassium.   5/7 : Awake and alert but very weak.  5/8 : Patient is seen and examined at the bedside.  Ambulated in the hall with physical therapy    Assessment and Plan:   Hepatic encephalopathy (HCC) Liver cirrhosis Recurrent hospitalization for the same reason.   CT head negative, mentation started improving with lowering of ammonia level. Per patient he is compliant with home lactulose  but does not get daily bowel movements. - Continue lactulose  30 g 3 times daily - Continue rifaximin  - Resume Lasix , and spironolactone  with holding parameters     Essential hypertension Improved blood pressure Resume Lasix  and spironolactone  with holding parameters     CAD (coronary artery disease) No chest pain - Continue home aspirin  and Lipitor   Sinus  bradycardia Asymptomatic and chronic -Avoid nodal blocking agents   Pancytopenia (HCC) Seems chronic and at baseline -Continue to monitor   Type II diabetes mellitus with renal manifestations (HCC) Diet control.  A1c of 4.9. Episodes of hypoglycemia Discontinue insulin  Continue consistent carbohydrate diet     COPD (chronic obstructive pulmonary disease) (HCC) No concern of exacerbation. - Continue with bronchodilator and supportive therapy   Chronic kidney disease, stage 3a (HCC) Renal function stable.  Recent baseline creatinine 1.0-1.4.  - Monitor renal function -Avoid nephrotoxins   Chronic diastolic CHF (congestive heart failure) (HCC)  2D echo on 12/24/2022 showed EF 60 to 65%.  Has has trace leg edema, no JVD.  No oxygen desaturation.  CHF seem to be compensated.  BNP mildly elevated at 381 - Continue Lasix  and spironolactone    Hypomagnesemia Magnesium  with some improvement to 1.7 but remained borderline low.  Also found to have mild hypokalemia which is being repleted - Supplement magnesium  and potassium   Depression with anxiety - Continue home Celexa  and Seroquel    Obesity (BMI 30-39.9) Estimated body mass index is 32.72 kg/m as calculated from the following:   Height as of 12/21/23: 5\' 6"  (1.676 m).   Weight as of this encounter: 91.9 kg.    - Encouraged weight loss       Subjective: Patient is seen and examined at the bedside.  Has no new complaints  Physical Exam: Vitals:   01/02/24 0352 01/02/24 0500 01/02/24 0753 01/02/24 1438  BP: (!) 122/56  (!) 113/55 102/64  Pulse: (!) 42  (!) 42 (!) 49  Resp: 18  18 16   Temp: 98 F (36.7 C)  97.8 F (36.6 C) 98.5  F (36.9 C)  TempSrc: Oral   Oral  SpO2: 96%  100% 100%  Weight:  97.5 kg    Height:       General.  Obese elderly man, in no acute distress. Pulmonary.  Lungs clear bilaterally, normal respiratory effort. CV.  Regular rate and rhythm, no JVD, rub or murmur. Abdomen.  Soft, nontender,  nondistended, BS positive. CNS.  Alert and oriented x 2.  No focal neurologic deficit. Extremities.  No edema, no cyanosis, pulses intact and symmetrical.    Data Reviewed: BUN 28, creatinine 1.39 Labs reviewed  Family Communication: Plan of care was discussed with patient in detail. He verbalizes understanding and agrees with the plan.  Disposition: Status is: Inpatient Remains inpatient appropriate because: Discharge planning  Planned Discharge Destination: Home with Home Health    Time spent: 34 minutes  Author: Read Camel, MD 01/02/2024 3:05 PM  For on call review [email or web address].

## 2024-01-02 NOTE — Care Management Important Message (Signed)
 Important Message  Patient Details  Name: [name] MRN: [id number] Date of Birth: Jan 25, 1951   Important Message Given:  Yes - Medicare IM     Anise Kerns 01/02/2024, 1:39 PM

## 2024-01-02 NOTE — Evaluation (Signed)
 Physical Therapy Evaluation Patient Details Name: [name] MRN: [id number] DOB: 03/17/1951 Today's Date: 01/02/2024  History of Present Illness  Pt is a 73 y.o. male presenting to hospital 12/30/23 with c/o AMS and weakness; recently hospitalized with hepatic encephalopathy.  Pt admitted with hepatic encephalopathy, htn, chronic distolic CHF.  PMH includes asthma, CHF, COPD, DM, htn, sleep apnea, cardiac cath, eye sx, inner ear sx, nose sx, hepatitis C, liver cirrhosis, recurrent hepatic encephalopathy, TIA, PTSD, ACDF 2024.  Clinical Impression  Prior to recent medical concerns, pt reports being ambulatory without AD; lives in a group home with ramp to enter.  No c/o pain during session.  Currently pt is SBA semi-supine to sitting EOB; CGA with transfers; and CGA ambulating around nursing loop with RW use.  Pt demonstrated improved standing balance and gait with use of RW (compared to no AD use).  Pt would currently benefit from skilled PT to address noted impairments and functional limitations (see below for any additional details).  Upon hospital discharge, pt would benefit from ongoing therapy.     If plan is discharge home, recommend the following: A little help with walking and/or transfers;A little help with bathing/dressing/bathroom;Assistance with cooking/housework;Assist for transportation;Help with stairs or ramp for entrance   Can travel by private vehicle    Yes    Equipment Recommendations Rolling walker (2 wheels)  Recommendations for Other Services       Functional Status Assessment Patient has had a recent decline in their functional status and demonstrates the ability to make significant improvements in function in a reasonable and predictable amount of time.     Precautions / Restrictions Precautions Precautions: Fall Restrictions Weight Bearing Restrictions Per Provider Order: No      Mobility  Bed Mobility Overal bed mobility: Needs Assistance Bed Mobility:  Supine to Sit     Supine to sit: Supervision, HOB elevated     General bed mobility comments: increased time to perform on own    Transfers Overall transfer level: Needs assistance Equipment used: None, Rolling walker (2 wheels) Transfers: Sit to/from Stand, Bed to chair/wheelchair/BSC Sit to Stand: Contact guard assist   Step pivot transfers: Contact guard assist       General transfer comment: sit to stand from bed x1 trial and from recliner x1 trial; stand step turn bed to recliner CGA (pt appearing unsteady and wanting to hold onto something)    Ambulation/Gait Ambulation/Gait assistance: Contact guard assist Gait Distance (Feet): 200 Feet Assistive device: Rolling walker (2 wheels) Gait Pattern/deviations: Step-through pattern Gait velocity: mildly decreased     General Gait Details: steady ambulation with RW use  Stairs            Wheelchair Mobility     Tilt Bed    Modified Rankin (Stroke Patients Only)       Balance Overall balance assessment: Needs assistance Sitting-balance support: No upper extremity supported, Feet supported Sitting balance-Leahy Scale: Good Sitting balance - Comments: steady reaching within BOS   Standing balance support: Single extremity supported Standing balance-Leahy Scale: Good Standing balance comment: steady reaching within BOS with single UE support                             Pertinent Vitals/Pain Pain Assessment Pain Assessment: No/denies pain HR 47 bpm at rest beginning of session and increased up to 55 bpm with activity (nurse notified); SpO2 sats 99% or greater on room air during session.  Home Living Family/patient expects to be discharged to:: Group home Living Arrangements: Group Home Available Help at Discharge: Personal care attendant Type of Home: Group Home Home Access: Ramped entrance       Home Layout: One level Home Equipment: Rollator (4 wheels)      Prior Function Prior  Level of Function : Needs assist;History of Falls (last six months)             Mobility Comments: Pt reports being modified independent with ambulation (has used cane and 4ww in the past); h/o falls ADLs Comments: Group home staff assists with ADL's, IADL's, and medication management     Extremity/Trunk Assessment   Upper Extremity Assessment Upper Extremity Assessment: Overall WFL for tasks assessed    Lower Extremity Assessment Lower Extremity Assessment: Generalized weakness       Communication   Communication Communication: Impaired Factors Affecting Communication: Hearing impaired    Cognition Arousal: Alert Behavior During Therapy: WFL for tasks assessed/performed   PT - Cognitive impairments: No apparent impairments                       PT - Cognition Comments: A&O to person, place, year (pt reported it was February), and general situation. Following commands: Intact       Cueing Cueing Techniques: Verbal cues     General Comments  Nursing cleared pt for participation in physical therapy.  Pt agreeable to PT session.    Exercises     Assessment/Plan    PT Assessment Patient needs continued PT services  PT Problem List Decreased strength;Decreased balance;Decreased mobility;Decreased cognition       PT Treatment Interventions DME instruction;Gait training;Functional mobility training;Therapeutic activities;Therapeutic exercise;Balance training;Patient/family education    PT Goals (Current goals can be found in the Care Plan section)  Acute Rehab PT Goals Patient Stated Goal: to improve walking PT Goal Formulation: With patient Time For Goal Achievement: 01/16/24 Potential to Achieve Goals: Good    Frequency Min 2X/week     Co-evaluation               AM-PAC PT "6 Clicks" Mobility  Outcome Measure Help needed turning from your back to your side while in a flat bed without using bedrails?: None Help needed moving from lying  on your back to sitting on the side of a flat bed without using bedrails?: A Little Help needed moving to and from a bed to a chair (including a wheelchair)?: A Little Help needed standing up from a chair using your arms (e.g., wheelchair or bedside chair)?: A Little Help needed to walk in hospital room?: A Little Help needed climbing 3-5 steps with a railing? : A Little 6 Click Score: 19    End of Session Equipment Utilized During Treatment: Gait belt Activity Tolerance: Patient tolerated treatment well Patient left: in chair;with call bell/phone within reach;with chair alarm set;with nursing/sitter in room;with SCD's reapplied Nurse Communication: Mobility status;Precautions PT Visit Diagnosis: Other abnormalities of gait and mobility (R26.89);Unsteadiness on feet (R26.81);Muscle weakness (generalized) (M62.81);History of falling (Z91.81)    Time: 1610-9604 PT Time Calculation (min) (ACUTE ONLY): 28 min   Charges:   PT Evaluation $PT Eval Low Complexity: 1 Low PT Treatments $Gait Training: 8-22 mins PT General Charges $$ ACUTE PT VISIT: 1 Visit        Amador Junes, PT 01/02/24, 12:24 PM

## 2024-01-02 NOTE — Plan of Care (Signed)

## 2024-01-03 ENCOUNTER — Other Ambulatory Visit: Payer: Self-pay

## 2024-01-03 DIAGNOSIS — K7682 Hepatic encephalopathy: Secondary | ICD-10-CM | POA: Diagnosis not present

## 2024-01-03 DIAGNOSIS — Z978 Presence of other specified devices: Secondary | ICD-10-CM

## 2024-01-03 LAB — GLUCOSE, CAPILLARY
Glucose-Capillary: 65 mg/dL — ABNORMAL LOW (ref 70–99)
Glucose-Capillary: 98 mg/dL (ref 70–99)

## 2024-01-03 MED ORDER — FUROSEMIDE 20 MG PO TABS
20.0000 mg | ORAL_TABLET | Freq: Every day | ORAL | Status: DC
  Administered 2024-01-03: 20 mg via ORAL
  Filled 2024-01-03: qty 1

## 2024-01-03 MED ORDER — SPIRONOLACTONE 25 MG PO TABS
12.5000 mg | ORAL_TABLET | Freq: Every day | ORAL | 1 refills | Status: DC
  Filled 2024-01-03 (×2): qty 30, 60d supply, fill #0

## 2024-01-03 MED ORDER — FUROSEMIDE 20 MG PO TABS
20.0000 mg | ORAL_TABLET | Freq: Every day | ORAL | 0 refills | Status: DC
  Filled 2024-01-03 (×2): qty 30, 30d supply, fill #0

## 2024-01-03 NOTE — TOC Transition Note (Signed)
 Transition of Care [facility]) - Discharge Note   Patient Details  Name: [name] MRN: [id number] Date of Birth: 1950/09/13  [facility] CM/SW Contact:  Sintia Mckissic C Hymen Arnett, RN Phone Number: 01/03/2024, 3:56 PM   Clinical Narrative:    HH PT arranged with [name] advised agnecy will contact him to arrange appointment.   Final next level of care: Group Home Barriers to Discharge: Barriers Resolved   Patient Goals and CMS Choice Patient states their goals for this hospitalization and ongoing recovery are:: group home          Discharge Placement                       Discharge Plan and Services Additional resources added to the After Visit Summary for                                       Social Drivers of Health (SDOH) Interventions SDOH Screenings   Food Insecurity: No Food Insecurity (11/29/2023)  Housing: Low Risk  (11/29/2023)  Transportation Needs: No Transportation Needs (11/29/2023)  Utilities: Not At Risk (11/29/2023)  Alcohol Screen: Low Risk  (09/24/2022)  Depression (PHQ2-9): High Risk (04/26/2022)  Financial Resource Strain: High Risk (04/27/2022)  Physical Activity: Sufficiently Active (04/16/2018)  Social Connections: Socially Isolated (11/29/2023)  Stress: Stress Concern Present (04/16/2018)  Tobacco Use: High Risk (12/30/2023)     Readmission Risk Interventions    11/29/2023    5:03 PM 11/13/2023    2:21 PM 11/12/2023    4:00 PM  Readmission Risk Prevention Plan  Transportation Screening Not Complete  Not Complete  Medication Review Oceanographer) Not Complete Complete Not Complete  PCP or Specialist appointment within 3-5 days of discharge Not Complete Complete Complete  HRI or Home Care Consult Not Complete Complete   SW Recovery Care/Counseling Consult Not Complete Complete   Palliative Care Screening Not Applicable Complete   Skilled Nursing Facility Not Applicable Not Applicable

## 2024-01-03 NOTE — Plan of Care (Signed)

## 2024-01-03 NOTE — Discharge Summary (Addendum)
 Physician Discharge Summary   Patient: [name] MRN: [id number] DOB: 06-14-1951  Admit date:     12/30/2023  Discharge date: 01/03/24  Discharge Physician: Hall Birchard   PCP: [employer] [employer]   Recommendations at discharge:   Take lactulose  and Xifaxan  as recommended Maintain low-sodium diet Keep scheduled follow-up appointment with primary care provider  Discharge Diagnoses: Principal Problem:   Hepatic encephalopathy (HCC) Active Problems:   Liver cirrhosis (HCC)   Essential hypertension   Sinus bradycardia   CAD (coronary artery disease)   Pancytopenia (HCC)   COPD (chronic obstructive pulmonary disease) (HCC)   Type II diabetes mellitus with renal manifestations (HCC)   Chronic kidney disease, stage 3a (HCC)   Chronic diastolic CHF (congestive heart failure) (HCC)   Hypomagnesemia   Depression with anxiety   Obesity (BMI 30-39.9)   Chronic indwelling Foley catheter  Resolved Problems:   * No resolved hospital problems. *  Hospital Course: [name] is a 73 y.o. male with medical history significant of hepatitis C and B, liver cirrhosis, hepatic encephalopathy, pancytopenia depression, anxiety, hypertension, hyperlipidemia, diabetes mellitus, COPD, CAD, dCHF, TIA, migraine headache, liver lesion (suspecting cancer), sinus bradycardia, who presents with altered mental status.    Patient has AMS, and is unable to provide accurate medical history, therefore, most of the history is obtained by discussing the case with ED physician, per EMS report, and with the nursing staff.    Per report, pt was LNK at breakfast in this AM . He was noted to be confused in later afternoon. When I saw pt in ED, he is confused, still knows his own name, not oriented to place and time.  He moves all extremities, no facial droop or slurred speech. No active nausea, vomiting, diarrhea noted.  No respiratory distress, active cough, SOB noted.  Does not seem to have pain  anywhere. Pt has muscle spasm per nurse report.   Data reviewed independently and ED Course: pt was found to have ammonia 117, magnesium  1.6, pancytopenia with WBC 2.7, hemoglobin 10.8 and platelet 107 (patient had WBC 3.2, hemoglobin 10.1, platelet 73 on/7/25), liver function (ALP 70, AST 47, ALT 34. total bilirubin 1.7), stable renal function, temperature 97.8, blood pressure 153/70, heart rate 45, RR 18, oxygen saturation 99% on room air.  CT of head negative for acute intracranial abnormalities.  Patient is admitted to PCU as inpatient.     EKG: I have personally reviewed.  Bradycardia, heart rate 42, early R wave progression, QTc 403     Assessment and Plan:   Hepatic encephalopathy (HCC) Liver cirrhosis Recurrent hospitalization for the same reason.   CT head negative, mentation started improving with lowering of ammonia level. Per patient he is compliant with home lactulose  but does not get daily bowel movements. - Continue lactulose  30 g 3 times daily - Continue rifaximin  - Resume Lasix , and spironolactone  but will decrease dose of spironolactone  to 12.5 daily     Essential hypertension Improved blood pressure Resume Lasix  and spironolactone  but decrease dose to 12.5 mg daily Discontinue Cozaar    CAD (coronary artery disease) No chest pain Continue home aspirin  and Lipitor    Sinus bradycardia Asymptomatic and chronic -Avoid nodal blocking agents    Pancytopenia (HCC) Secondary to liver cirrhosis -Continue to monitor   Type II diabetes mellitus with renal manifestations (HCC) Diet control.  A1c of 4.9. Episodes of hypoglycemia Discontinue Jardiance  Continue consistent carbohydrate diet     COPD (chronic obstructive pulmonary disease) (HCC)  No concern of exacerbation. Continue with bronchodilator and supportive therapy    Chronic kidney disease, stage 3a (HCC) Renal function stable.  Recent baseline creatinine 1.0-1.4.  Monitor renal function Avoid  nephrotoxins    Chronic diastolic CHF (congestive heart failure) (HCC)  2D echo on 12/24/2022 showed EF 60 to 65%.  Has has trace leg edema, no JVD.  No oxygen desaturation.  CHF seem to be compensated.  BNP mildly elevated at 381 - Continue Lasix  and spironolactone    Hypomagnesemia Magnesium  with some improvement to 1.7 but remained borderline low.  Also found to have mild hypokalemia which is being repleted - Supplement magnesium  and potassium   Depression with anxiety - Continue home Celexa  and Seroquel    Obesity (BMI 30-39.9).  Class II BMI 35.15 Lifestyle modification and exercise has been discussed with patient Encouraged weight loss            Consultants: None Procedures performed: None  Disposition: Home health Diet recommendation:  Discharge Diet Orders (From admission, onward)     Start     Ordered   01/03/24 0000  Diet - low sodium heart healthy        01/03/24 1308           Carb modified diet DISCHARGE MEDICATION: Allergies as of 01/03/2024       Reactions   Bee Venom Anaphylaxis   Codeine  Itching   Only itching. Recently allergy tested at [facility]   Rsv Mrna Pre-f Virus Vaccine Swelling   Meloxicam    Other Reaction(s): ?overdose of local given at dentist--had trouble breathing   Codeine     Novocain [procaine]    Sulfa Antibiotics Other (See Comments)   Sulfa Antibiotics    hallucinations        Medication List     STOP taking these medications    Jardiance  10 MG Tabs tablet Generic drug: empagliflozin    losartan  50 MG tablet Commonly known as: COZAAR        TAKE these medications    acetaminophen  650 MG CR tablet Commonly known as: TYLENOL  Take 650 mg by mouth every 8 (eight) hours as needed for pain.   aspirin  81 MG chewable tablet Chew 81 mg by mouth daily.   atorvastatin  40 MG tablet Commonly known as: LIPITOR Take 40 mg by mouth daily.   citalopram  20 MG tablet Commonly known as: CELEXA  Take 20 mg by mouth daily.    feeding supplement Liqd Take 237 mLs by mouth 3 (three) times daily between meals.   furosemide  20 MG tablet Commonly known as: LASIX  Take 1 tablet (20 mg total) by mouth daily.   gabapentin  300 MG capsule Commonly known as: NEURONTIN  Take 300 mg by mouth 2 (two) times daily.   lactulose  10 GM/15ML solution Commonly known as: CHRONULAC  Take 45 mLs (30 g total) by mouth 3 (three) times daily. Please titrate to have 2-3 soft bowel movements daily   multivitamin with minerals Tabs tablet Take 1 tablet by mouth daily.   nitroGLYCERIN  0.4 MG/SPRAY spray Commonly known as: NITROLINGUAL  Place 1 spray under the tongue every 5 (five) minutes x 3 doses as needed for chest pain.   ondansetron  4 MG tablet Commonly known as: ZOFRAN  Take 1 tablet (4 mg total) by mouth every 6 (six) hours as needed for nausea.   pantoprazole  40 MG tablet Commonly known as: PROTONIX  Take 40 mg by mouth daily.   phenol 1.4 % Liqd Commonly known as: CHLORASEPTIC Use as directed 1 spray in the mouth  or throat as needed for throat irritation / pain.   QUEtiapine  50 MG tablet Commonly known as: SEROQUEL  Take 50 mg by mouth at bedtime.   senna 8.6 MG Tabs tablet Commonly known as: SENOKOT Take 1 tablet (8.6 mg total) by mouth 2 (two) times daily as needed for mild constipation.   spironolactone  25 MG tablet Commonly known as: ALDACTONE  Take 0.5 tablets (12.5 mg total) by mouth daily. What changed: how much to take   Ventolin  HFA 108 (90 Base) MCG/ACT inhaler Generic drug: albuterol  Inhale 2 puffs into the lungs every 4 (four) hours as needed for wheezing.   albuterol  (2.5 MG/3ML) 0.083% nebulizer solution Commonly known as: PROVENTIL  Take 2.5 mg by nebulization every 4 (four) hours as needed for wheezing or shortness of breath.   Xifaxan  550 MG Tabs tablet Generic drug: rifaximin  Take 550 mg by mouth 2 (two) times daily.        Discharge Exam: Filed Weights   01/01/24 0500 01/02/24 0500  01/03/24 0500  Weight: 90 kg 97.5 kg 95.8 kg   General.  Obese elderly man, in no acute distress. Pulmonary.  Lungs clear bilaterally, normal respiratory effort. CV.  Regular rate and rhythm, no JVD, rub or murmur. Abdomen.  Soft, nontender, nondistended, BS positive. CNS.  Alert and oriented x 2.  No focal neurologic deficit. Extremities.  No edema, no cyanosis, pulses intact and symmetrical.   Condition at discharge: stable  The results of significant diagnostics from this hospitalization (including imaging, microbiology, ancillary and laboratory) are listed below for reference.   Imaging Studies: CT HEAD WO CONTRAST ( ) Result Date: 12/30/2023 CLINICAL DATA:  Altered mental status, weakness. EXAM: CT HEAD WITHOUT CONTRAST TECHNIQUE: Contiguous axial images were obtained from the base of the skull through the vertex without intravenous contrast. RADIATION DOSE REDUCTION: This exam was performed according to the departmental dose-optimization program which includes automated exposure control, adjustment of the mA and/or kV according to patient size and/or use of iterative reconstruction technique. COMPARISON:  CT head 11/29/2023. FINDINGS: Brain: No acute intracranial hemorrhage. No CT evidence of acute infarct. No edema, mass effect, or midline shift. The basilar cisterns are patent. Ventricles: The ventricles are normal. Vascular: No hyperdense vessel or unexpected calcification. Skull: No acute or aggressive finding. Chronic appearing deformity of the right lamina papyracea. Orbits: Orbits are symmetric. Sinuses: Focal secretions in the right posterior ethmoid air cells. Other: Mastoid air cells are clear. IMPRESSION: No CT evidence of acute intracranial abnormality. Electronically Signed   By: Denny Flack M.D.   On: 12/30/2023 18:07    Microbiology: Results for orders placed or performed during the hospital encounter of 11/29/23  Urine Culture     Status: Abnormal   Collection Time:  11/29/23  4:38 PM   Specimen: Urine, Random  Result Value Ref Range Status   Specimen Description   Final    URINE, RANDOM Performed at [facility], [address], [place], [place] [address]    Special Requests   Final    NONE Reflexed from [id number] Performed at [facility], [address] [place], [place] [address]    Culture >=100,000 COLONIES/mL KLEBSIELLA PNEUMONIAE (A)  Final   Report Status 12/02/2023 FINAL  Final   Organism ID, Bacteria KLEBSIELLA PNEUMONIAE (A)  Final      Susceptibility   Klebsiella pneumoniae - MIC*    AMPICILLIN RESISTANT Resistant     CEFAZOLIN  <=4 SENSITIVE Sensitive     CEFEPIME  <=0.12 SENSITIVE Sensitive  CEFTRIAXONE  <=0.25 SENSITIVE Sensitive     CIPROFLOXACIN <=0.25 SENSITIVE Sensitive     GENTAMICIN <=1 SENSITIVE Sensitive     IMIPENEM <=0.25 SENSITIVE Sensitive     NITROFURANTOIN 32 SENSITIVE Sensitive     TRIMETH/SULFA <=20 SENSITIVE Sensitive     AMPICILLIN/SULBACTAM 4 SENSITIVE Sensitive     PIP/TAZO <=4 SENSITIVE Sensitive ug/mL    * >=100,000 COLONIES/mL KLEBSIELLA PNEUMONIAE  Resp panel by RT-PCR (RSV, Flu A&B, Covid) Anterior Nasal Swab     Status: None   Collection Time: 11/29/23  6:38 PM   Specimen: Anterior Nasal Swab  Result Value Ref Range Status   SARS Coronavirus 2 by RT PCR NEGATIVE NEGATIVE Final    Comment: (NOTE) SARS-CoV-2 target nucleic acids are NOT DETECTED.  The SARS-CoV-2 RNA is generally detectable in upper respiratory specimens during the acute phase of infection. The lowest concentration of SARS-CoV-2 viral copies this assay can detect is 138 copies/mL. A negative result does not preclude SARS-Cov-2 infection and should not be used as the sole basis for treatment or other patient management decisions. A negative result may occur with  improper specimen collection/handling, submission of specimen other than nasopharyngeal swab, presence of viral mutation(s) within the areas targeted by  this assay, and inadequate number of viral copies(<138 copies/mL). A negative result must be combined with clinical observations, patient history, and epidemiological information. The expected result is Negative.  Fact Sheet for Patients:  BloggerCourse.com  Fact Sheet for Healthcare Providers:  SeriousBroker.it  This test is no t yet approved or cleared by the [place]  FDA and  has been authorized for detection and/or diagnosis of SARS-CoV-2 by FDA under an Emergency Use Authorization (EUA). This EUA will remain  in effect (meaning this test can be used) for the duration of the COVID-19 declaration under Section 564(b)(1) of the Act, 21 U.S.C.section 360bbb-3(b)(1), unless the authorization is terminated  or revoked sooner.       Influenza A by PCR NEGATIVE NEGATIVE Final   Influenza B by PCR NEGATIVE NEGATIVE Final    Comment: (NOTE) The Xpert Xpress SARS-CoV-2/FLU/RSV plus assay is intended as an aid in the diagnosis of influenza from Nasopharyngeal swab specimens and should not be used as a sole basis for treatment. Nasal washings and aspirates are unacceptable for Xpert Xpress SARS-CoV-2/FLU/RSV testing.  Fact Sheet for Patients: BloggerCourse.com  Fact Sheet for Healthcare Providers: SeriousBroker.it  This test is not yet approved or cleared by the [place]  FDA and has been authorized for detection and/or diagnosis of SARS-CoV-2 by FDA under an Emergency Use Authorization (EUA). This EUA will remain in effect (meaning this test can be used) for the duration of the COVID-19 declaration under Section 564(b)(1) of the Act, 21 U.S.C. section 360bbb-3(b)(1), unless the authorization is terminated or revoked.     Resp Syncytial Virus by PCR NEGATIVE NEGATIVE Final    Comment: (NOTE) Fact Sheet for Patients: BloggerCourse.com  Fact Sheet  for Healthcare Providers: SeriousBroker.it  This test is not yet approved or cleared by the [place]  FDA and has been authorized for detection and/or diagnosis of SARS-CoV-2 by FDA under an Emergency Use Authorization (EUA). This EUA will remain in effect (meaning this test can be used) for the duration of the COVID-19 declaration under Section 564(b)(1) of the Act, 21 U.S.C. section 360bbb-3(b)(1), unless the authorization is terminated or revoked.  Performed at [facility], [address] [place], [place] [address]     Labs: CBC: Recent Labs  Lab 12/30/23 [id number]  12/31/23 0435  WBC 2.7* 2.0*  NEUTROABS 1.4*  --   HGB 10.8* 9.2*  HCT 33.3* 27.4*  MCV 97.9 97.9  PLT 107* 90*   Basic Metabolic Panel: Recent Labs  Lab 12/30/23 1631 12/31/23 0435 01/01/24 0935 01/02/24 0511  NA 142 139 137 139  K 4.0 3.4* 3.9 4.2  CL 109 111 111 109  CO2 26 22 21* 23  GLUCOSE 86 87 70 63*  BUN 23 23 26* 28*  CREATININE 1.29* 1.15 1.35* 1.39*  CALCIUM  9.7 8.9 8.9 9.0  MG 1.6* 1.7 2.0  --   PHOS 3.3  --   --   --    Liver Function Tests: Recent Labs  Lab 12/30/23 1631 12/31/23 0435  AST 47* 42*  ALT 34 29  ALKPHOS 70 54  BILITOT 1.7* 1.6*  PROT 6.6 5.5*  ALBUMIN  2.5* 2.1*   CBG: Recent Labs  Lab 01/02/24 1226 01/02/24 1631 01/02/24 2035 01/03/24 0808 01/03/24 1228  GLUCAP 117* 84 161* 65* 98    Discharge time spent: greater than 30 minutes.  Signed: Read Camel, MD [facility] 01/03/2024

## 2024-01-03 NOTE — Progress Notes (Signed)
 Rounded on patient. Patient sitting up in bed eating. Patient assisted with denture adhesive.   When asked about why he came to the hospital, patient states "not much you can do when the cops bring you here." Unsure as to what he is referring to, as the ER notes state he came by EMS.  Patient goes on to say "every time I even look sideways that woman sends me here."   Patient remains disoriented to time. Questionable orientation to situation--this seems to fluctuate heavily. Orientation pretty consistent with previous presentations.   Referral for guardianship has been submitted to DSS.   Patient encouraged to reach out with any questions or concerns.

## 2024-01-03 NOTE — NC FL2 (Addendum)
 [place]  MEDICAID FL2 LEVEL OF CARE FORM     IDENTIFICATION  Patient Name: [name] Birthdate: 30-Sep-1950 Sex: male Admission Date (Current Location): 12/30/2023  [place] and [place] Number:  Chiropodist and Address:  [facility], [address] [place], [place] [address]      Provider Number: [id number]  Attending Physician Name and Address:  Read Camel, MD  Relative Name and Phone Number:  [name])  [phone] [facility])    Current Level of Care: Hospital Recommended Level of Care: Other (Comment) (group home) Prior Approval Number:    Date Approved/Denied:   PASRR Number:    Discharge Plan: Other (Comment) (group home)    Current Diagnoses: Patient Active Problem List   Diagnosis Date Noted   Hypomagnesemia 12/30/2023   Depression with anxiety 12/30/2023   Altered mental status 11/12/2023   Type II diabetes mellitus with renal manifestations (HCC) 10/21/2023   Chronic kidney disease, stage 3a (HCC) 10/21/2023   Myocardial injury 10/21/2023   CKD stage 3a, GFR 45-59 ml/min (HCC) - baseline scr 1.3 10/01/2023   Hepatic encephalopathy (HCC) 09/30/2023   Liver cirrhosis (HCC) 09/30/2023   Essential hypertension 09/30/2023   Diabetes mellitus type 2, noninsulin dependent (HCC) 09/30/2023   CAD (coronary artery disease) 09/30/2023   Hyperlipidemia 09/30/2023   Hepatic encephalopathy (HCC) 08/01/2023   Hyperammonemia (HCC) 07/30/2023   Cervical spinal stenosis 07/01/2023   Cervical myelopathy (HCC) 06/21/2023   Cervical stenosis of spine 06/21/2023   Cervical radiculopathy 06/20/2023   E coli bacteremia 06/17/2023   UTI (urinary tract infection) 06/17/2023   Left leg cellulitis 03/23/2023   Leukocytosis 03/23/2023   Serum total bilirubin elevated 03/23/2023   Left leg pain 03/23/2023   Suicidal ideation 01/14/2023   Acute pain 01/14/2023   Adrenal insufficiency (HCC) 12/26/2022   Orthostatic  hypotension 12/24/2022   Rib pain on right side 12/24/2022   Hepatitis B infection 12/24/2022   Myocardial ischemia 12/24/2022   Acute appendicitis 11/05/2022   Pancytopenia (HCC) 11/05/2022   Sinus bradycardia 11/05/2022   Acute metabolic encephalopathy 09/22/2022   Liver lesion 09/22/2022   Type 2 diabetes mellitus with hyperlipidemia (HCC) 09/22/2022   Obesity (BMI 30-39.9) 09/22/2022   Acute hepatic encephalopathy (HCC) 09/22/2022   MDD (major depressive disorder), recurrent severe, 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   Major depressive disorder, 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 psychotic features (HCC) 09/15/2022   Protein-calorie malnutrition, severe (HCC) 08/31/2022   Hypotension 08/31/2022   Weakness    Hx of transient ischemic attack (TIA) 04/28/2022   Obesity    Chronic diastolic CHF (congestive heart failure) (HCC)    Depression    Sepsis (HCC) 03/28/2022   Bacteremia due to Klebsiella pneumoniae 03/28/2022   Hypoglycemia 03/28/2022   Syncope 03/24/2022   Hypokalemia 03/24/2022   Fever 03/24/2022   COPD (chronic obstructive pulmonary disease) (HCC)    Elevated troponin    Abnormal LFTs    History of hepatitis C    AKI (acute kidney injury) (HCC)    Pure hypercholesterolemia    Obesity, Class III, BMI 40-49.9 (morbid obesity) 04/18/2020   Thrombocytopenia (HCC) 04/17/2020   Leukopenia 04/17/2020   HTN (hypertension) 04/17/2018   Uncontrolled type 2 diabetes mellitus with hyperglycemia, without long-term current use of insulin  (HCC) 04/17/2018   Lymphedema 04/17/2018   Normocytic anemia 04/07/2018   Chest pain 03/20/2018   Unsheltered homelessness 12/16/2017    Orientation RESPIRATION BLADDER Height & Weight     Self, Place, Situation  Normal Incontinent,  External catheter Weight: 95.8 kg Height:  5\' 5"  (165.1 cm)  BEHAVIORAL SYMPTOMS/MOOD NEUROLOGICAL BOWEL NUTRITION STATUS     (n/a) Continent Diet (DYS 2)  AMBULATORY STATUS COMMUNICATION OF NEEDS Skin   Limited  Assist Verbally Normal                       Personal Care Assistance Level of Assistance  Bathing, Dressing Bathing Assistance: Limited assistance   Dressing Assistance: Limited assistance     Functional Limitations Info  Sight, Hearing Sight Info: Adequate Hearing Info: Adequate      SPECIAL CARE FACTORS FREQUENCY  PT (By licensed PT), OT (By licensed OT)     PT Frequency: Min 2x weekly OT Frequency: Min 2x weekly            Contractures Contractures Info: Not present    Additional Factors Info  Code Status, Allergies Code Status Info: DNR Allergies Info: Bee Venom, Codeine , Rsv Mrna Pre-f Virus Vaccine, Meloxicam, Codeine , Novocain (Procaine), Sulfa Antibiotics, Sulfa Antibiotics           Current Medications (01/03/2024):   TAKE these medications     acetaminophen  650 MG CR tablet Commonly known as: TYLENOL  Take 650 mg by mouth every 8 (eight) hours as needed for pain.    aspirin  81 MG chewable tablet Chew 81 mg by mouth daily.    atorvastatin  40 MG tablet Commonly known as: LIPITOR Take 40 mg by mouth daily.    citalopram  20 MG tablet Commonly known as: CELEXA  Take 20 mg by mouth daily.    feeding supplement Liqd Take 237 mLs by mouth 3 (three) times daily between meals.    furosemide  20 MG tablet Commonly known as: LASIX  Take 1 tablet (20 mg total) by mouth daily.    gabapentin  300 MG capsule Commonly known as: NEURONTIN  Take 300 mg by mouth 2 (two) times daily.    lactulose  10 GM/15ML solution Commonly known as: CHRONULAC  Take 45 mLs (30 g total) by mouth 3 (three) times daily. Please titrate to have 2-3 soft bowel movements daily    multivitamin with minerals Tabs tablet Take 1 tablet by mouth daily.    nitroGLYCERIN  0.4 MG/SPRAY spray Commonly known as: NITROLINGUAL  Place 1 spray under the tongue every 5 (five) minutes x 3 doses as needed for chest pain.    ondansetron  4 MG tablet Commonly known as: ZOFRAN  Take 1 tablet (4 mg  total) by mouth every 6 (six) hours as needed for nausea.    pantoprazole  40 MG tablet Commonly known as: PROTONIX  Take 40 mg by mouth daily.    phenol 1.4 % Liqd Commonly known as: CHLORASEPTIC Use as directed 1 spray in the mouth or throat as needed for throat irritation / pain.    QUEtiapine  50 MG tablet Commonly known as: SEROQUEL  Take 50 mg by mouth at bedtime.    senna 8.6 MG Tabs tablet Commonly known as: SENOKOT Take 1 tablet (8.6 mg total) by mouth 2 (two) times daily as needed for mild constipation.    spironolactone  25 MG tablet Commonly known as: ALDACTONE  Take 0.5 tablets (12.5 mg total) by mouth daily. What changed: how much to take    Ventolin  HFA 108 (90 Base) MCG/ACT inhaler Generic drug: albuterol  Inhale 2 puffs into the lungs every 4 (four) hours as needed for wheezing.    albuterol  (2.5 MG/3ML) 0.083% nebulizer solution Commonly known as: PROVENTIL  Take 2.5 mg by nebulization every 4 (four) hours as needed for  wheezing or shortness of breath.    Xifaxan  550 MG Tabs tablet Generic drug: rifaximin  Take 550 mg by mouth 2 (two) times daily.       Discharge Medications: Please see discharge summary for a list of discharge medications.  Relevant Imaging Results:  Relevant Lab Results:   Additional Information SS#: [id number]  Astin [name] C Findley Vi, RN

## 2024-01-03 NOTE — TOC Progression Note (Addendum)
 Transition of Care [facility]) - Progression Note    Patient Details  Name: [name] MRN: [id number] Date of Birth: 1951/06/30  [facility]) CM/SW Contact  Baird Bombard, RN Phone Number: 01/03/2024, 1:54 PM  Clinical Narrative:    Eldora Greet with [facility], Group Home Owner, to advised of discharge. Patient is confirmed by Katherina Pan to be able to return to facility. Katherina Pan will transport patient to facility and is requesting discharge summary and FL2 be faxed to [phone]. Katherina Pan was advised of therapy recommendation for HHPT. He does not have a preference of a facility.  MD and nurse made aware.   2:57pm Hard copy of FL2 and discharge summary provided by this RNCM to [facility]. Once patient's medications have been delivered [facility] will transport patient to facility.   TOC signing off.            Expected Discharge Plan and Services         Expected Discharge Date: 01/03/24                                     Social Determinants of Health (SDOH) Interventions SDOH Screenings   Food Insecurity: No Food Insecurity (11/29/2023)  Housing: Low Risk  (11/29/2023)  Transportation Needs: No Transportation Needs (11/29/2023)  Utilities: Not At Risk (11/29/2023)  Alcohol Screen: Low Risk  (09/24/2022)  Depression (PHQ2-9): High Risk (04/26/2022)  Financial Resource Strain: High Risk (04/27/2022)  Physical Activity: Sufficiently Active (04/16/2018)  Social Connections: Socially Isolated (11/29/2023)  Stress: Stress Concern Present (04/16/2018)  Tobacco Use: High Risk (12/30/2023)    Readmission Risk Interventions    11/29/2023    5:03 PM 11/13/2023    2:21 PM 11/12/2023    4:00 PM  Readmission Risk Prevention Plan  Transportation Screening Not Complete  Not Complete  Medication Review Ocean[facility]) Not Complete Complete Not Complete  PCP or Specialist appointment within 3-5 days of discharge Not Complete Complete Complete  HRI or Home Care Consult Not  Complete Complete   SW Recovery Care/Counseling Consult Not Complete Complete   Palliative Care Screening Not Applicable Complete   Skilled Nursing Facility Not Applicable Not Applicable

## 2024-01-11 ENCOUNTER — Emergency Department

## 2024-01-11 ENCOUNTER — Observation Stay
Admission: EM | Admit: 2024-01-11 | Discharge: 2024-01-12 | Disposition: A | Discharge status: Home / Self Care | Attending: Internal Medicine | Admitting: Internal Medicine

## 2024-01-11 ENCOUNTER — Other Ambulatory Visit: Payer: Self-pay

## 2024-01-11 DIAGNOSIS — Z7982 Long term (current) use of aspirin: Secondary | ICD-10-CM | POA: Insufficient documentation

## 2024-01-11 DIAGNOSIS — I5032 Chronic diastolic (congestive) heart failure: Secondary | ICD-10-CM | POA: Insufficient documentation

## 2024-01-11 DIAGNOSIS — J449 Chronic obstructive pulmonary disease, unspecified: Secondary | ICD-10-CM | POA: Diagnosis not present

## 2024-01-11 DIAGNOSIS — E785 Hyperlipidemia, unspecified: Secondary | ICD-10-CM | POA: Diagnosis not present

## 2024-01-11 DIAGNOSIS — I2510 Atherosclerotic heart disease of native coronary artery without angina pectoris: Secondary | ICD-10-CM | POA: Insufficient documentation

## 2024-01-11 DIAGNOSIS — Z79899 Other long term (current) drug therapy: Secondary | ICD-10-CM | POA: Diagnosis not present

## 2024-01-11 DIAGNOSIS — E119 Type 2 diabetes mellitus without complications: Secondary | ICD-10-CM | POA: Diagnosis not present

## 2024-01-11 DIAGNOSIS — I110 Hypertensive heart disease with heart failure: Secondary | ICD-10-CM | POA: Insufficient documentation

## 2024-01-11 DIAGNOSIS — K7682 Hepatic encephalopathy: Principal | ICD-10-CM | POA: Diagnosis present

## 2024-01-11 DIAGNOSIS — R4182 Altered mental status, unspecified: Secondary | ICD-10-CM | POA: Diagnosis present

## 2024-01-11 LAB — URINALYSIS, ROUTINE W REFLEX MICROSCOPIC
Bilirubin Urine: NEGATIVE
Glucose, UA: NEGATIVE mg/dL
Hgb urine dipstick: NEGATIVE
Ketones, ur: NEGATIVE mg/dL
Nitrite: NEGATIVE
Protein, ur: NEGATIVE mg/dL
Specific Gravity, Urine: 1.015 (ref 1.005–1.030)
pH: 5 (ref 5.0–8.0)

## 2024-01-11 LAB — COMPREHENSIVE METABOLIC PANEL WITH GFR
ALT: 27 U/L (ref 0–44)
AST: 42 U/L — ABNORMAL HIGH (ref 15–41)
Albumin: 2.6 g/dL — ABNORMAL LOW (ref 3.5–5.0)
Alkaline Phosphatase: 63 U/L (ref 38–126)
Anion gap: 6 (ref 5–15)
BUN: 16 mg/dL (ref 8–23)
CO2: 22 mmol/L (ref 22–32)
Calcium: 9.1 mg/dL (ref 8.9–10.3)
Chloride: 112 mmol/L — ABNORMAL HIGH (ref 98–111)
Creatinine, Ser: 1.3 mg/dL — ABNORMAL HIGH (ref 0.61–1.24)
GFR, Estimated: 58 mL/min — ABNORMAL LOW (ref >60)
Glucose, Bld: 116 mg/dL — ABNORMAL HIGH (ref 70–99)
Potassium: 3.1 mmol/L — ABNORMAL LOW (ref 3.5–5.1)
Sodium: 140 mmol/L (ref 135–145)
Total Bilirubin: 1.9 mg/dL — ABNORMAL HIGH (ref 0.0–1.2)
Total Protein: 6.3 g/dL — ABNORMAL LOW (ref 6.5–8.1)

## 2024-01-11 LAB — CBC
HCT: 30.5 % — ABNORMAL LOW (ref 39.0–52.0)
Hemoglobin: 10.1 g/dL — ABNORMAL LOW (ref 13.0–17.0)
MCH: 31.9 pg (ref 26.0–34.0)
MCHC: 33.1 g/dL (ref 30.0–36.0)
MCV: 96.2 fL (ref 80.0–100.0)
Platelets: 90 10*3/uL — ABNORMAL LOW (ref 150–400)
RBC: 3.17 MIL/uL — ABNORMAL LOW (ref 4.22–5.81)
RDW: 15.9 % — ABNORMAL HIGH (ref 11.5–15.5)
WBC: 2.4 10*3/uL — ABNORMAL LOW (ref 4.0–10.5)
nRBC: 0 % (ref 0.0–0.2)

## 2024-01-11 LAB — MAGNESIUM: Magnesium: 1.5 mg/dL — ABNORMAL LOW (ref 1.7–2.4)

## 2024-01-11 LAB — CBG MONITORING, ED: Glucose-Capillary: 89 mg/dL (ref 70–99)

## 2024-01-11 LAB — AMMONIA: Ammonia: 82 umol/L — ABNORMAL HIGH (ref 9–35)

## 2024-01-11 LAB — GLUCOSE, CAPILLARY: Glucose-Capillary: 87 mg/dL (ref 70–99)

## 2024-01-11 MED ORDER — QUETIAPINE FUMARATE 25 MG PO TABS
50.0000 mg | ORAL_TABLET | Freq: Every day | ORAL | Status: DC
  Administered 2024-01-11: 50 mg via ORAL
  Filled 2024-01-11: qty 2

## 2024-01-11 MED ORDER — FUROSEMIDE 40 MG PO TABS
20.0000 mg | ORAL_TABLET | Freq: Every day | ORAL | Status: DC
  Administered 2024-01-12: 20 mg via ORAL
  Filled 2024-01-11: qty 1

## 2024-01-11 MED ORDER — LACTULOSE 10 GM/15ML PO SOLN
30.0000 g | Freq: Three times a day (TID) | ORAL | Status: DC
Start: 2024-01-11 — End: 2024-01-12
  Administered 2024-01-11: 30 g via ORAL
  Filled 2024-01-11: qty 60

## 2024-01-11 MED ORDER — CITALOPRAM HYDROBROMIDE 20 MG PO TABS
20.0000 mg | ORAL_TABLET | Freq: Every day | ORAL | Status: DC
  Administered 2024-01-12: 20 mg via ORAL
  Filled 2024-01-11: qty 1

## 2024-01-11 MED ORDER — RIFAXIMIN 550 MG PO TABS
550.0000 mg | ORAL_TABLET | Freq: Two times a day (BID) | ORAL | Status: DC
  Administered 2024-01-11 – 2024-01-12 (×2): 550 mg via ORAL
  Filled 2024-01-11 (×2): qty 1

## 2024-01-11 MED ORDER — SPIRONOLACTONE 25 MG PO TABS
50.0000 mg | ORAL_TABLET | Freq: Every day | ORAL | Status: DC
  Administered 2024-01-12: 50 mg via ORAL
  Filled 2024-01-11: qty 2

## 2024-01-11 MED ORDER — LACTULOSE 10 GM/15ML PO SOLN
20.0000 g | Freq: Once | ORAL | Status: AC
  Administered 2024-01-11: 20 g via ORAL
  Filled 2024-01-11: qty 30

## 2024-01-11 MED ORDER — PANTOPRAZOLE SODIUM 40 MG PO TBEC
40.0000 mg | DELAYED_RELEASE_TABLET | Freq: Every day | ORAL | Status: DC
  Administered 2024-01-12: 40 mg via ORAL
  Filled 2024-01-11: qty 1

## 2024-01-11 MED ORDER — ONDANSETRON HCL 4 MG PO TABS: 4.0000 mg | ORAL_TABLET | Freq: Four times a day (QID) | ORAL | Status: DC | PRN

## 2024-01-11 MED ORDER — ONDANSETRON HCL 4 MG/2ML IJ SOLN: 4.0000 mg | Freq: Four times a day (QID) | INTRAMUSCULAR | Status: DC | PRN

## 2024-01-11 MED ORDER — SENNA 8.6 MG PO TABS: 1.0000 | ORAL_TABLET | Freq: Two times a day (BID) | ORAL | Status: DC | PRN

## 2024-01-11 MED ORDER — ASPIRIN 81 MG PO CHEW
81.0000 mg | CHEWABLE_TABLET | Freq: Every day | ORAL | Status: DC
  Administered 2024-01-12: 81 mg via ORAL
  Filled 2024-01-11: qty 1

## 2024-01-11 MED ORDER — MAGNESIUM SULFATE IN D5W 1-5 GM/100ML-% IV SOLN
1.0000 g | Freq: Once | INTRAVENOUS | Status: AC
  Administered 2024-01-11: 1 g via INTRAVENOUS
  Filled 2024-01-11: qty 100

## 2024-01-11 MED ORDER — ALBUTEROL SULFATE (2.5 MG/3ML) 0.083% IN NEBU: 2.5000 mg | INHALATION_SOLUTION | RESPIRATORY_TRACT | Status: DC | PRN

## 2024-01-11 MED ORDER — POTASSIUM CHLORIDE CRYS ER 20 MEQ PO TBCR
40.0000 meq | EXTENDED_RELEASE_TABLET | ORAL | Status: AC
  Administered 2024-01-11 (×2): 40 meq via ORAL
  Filled 2024-01-11 (×2): qty 2

## 2024-01-11 MED ORDER — ACETAMINOPHEN 325 MG PO TABS
650.0000 mg | ORAL_TABLET | Freq: Three times a day (TID) | ORAL | Status: DC | PRN
  Administered 2024-01-11 – 2024-01-12 (×2): 650 mg via ORAL
  Filled 2024-01-11 (×2): qty 2

## 2024-01-11 MED ORDER — ATORVASTATIN CALCIUM 20 MG PO TABS
40.0000 mg | ORAL_TABLET | Freq: Every day | ORAL | Status: DC
  Administered 2024-01-12: 40 mg via ORAL
  Filled 2024-01-11: qty 2

## 2024-01-11 MED ORDER — GABAPENTIN 300 MG PO CAPS
300.0000 mg | ORAL_CAPSULE | Freq: Two times a day (BID) | ORAL | Status: DC
  Administered 2024-01-11 – 2024-01-12 (×2): 300 mg via ORAL
  Filled 2024-01-11 (×2): qty 1

## 2024-01-11 MED ORDER — ENSURE ENLIVE PO LIQD
237.0000 mL | Freq: Three times a day (TID) | ORAL | Status: DC
  Administered 2024-01-11 – 2024-01-12 (×2): 237 mL via ORAL

## 2024-01-11 MED ORDER — SPIRONOLACTONE 12.5 MG HALF TABLET: 12.5000 mg | ORAL_TABLET | Freq: Every day | ORAL | Status: DC

## 2024-01-11 NOTE — ED Triage Notes (Addendum)
 Pt from [facility]. Per staff pt has been acting differently in the past 2hrs. Usually up and walking around but has been more lethargic today. Pt glucose is 69 with EMS. Pt given instant glucose. Per pt he hasn't been walking well but denies any weakness. NIH negative. Pt states that "the lady at the group is mean and jerks me around."  Per pt the group home has not been giving him his lactulose .

## 2024-01-11 NOTE — ED Notes (Signed)
 ED Provider at bedside.

## 2024-01-11 NOTE — ED Notes (Signed)
 Fresh urine leg drainage bag placed

## 2024-01-11 NOTE — ED Provider Notes (Signed)
 [facility] Provider Note    Event Date/Time   First MD Initiated Contact with Patient 01/11/24 1459     (approximate)   History   Altered Mental Status (LKW 2hrs ago)   HPI  [name] is a 73 y.o. male with a history of CAD, cirrhosis, COPD, chronic Foley who presents with generalized weakness and possibly some confusion.  Group home notes that patient is not acting himself, staying in bed as opposed to ambulating around which she typically does.  Patient reports overall he feels well but when he tries to stand up he feels that his legs are weak bilaterally.  He is concerned that his ammonia could be elevated.  He states he does not feel confused.  No reports of fever     Physical Exam   Triage Vital Signs: ED Triage Vitals  Encounter Vitals Group     BP 01/11/24 1428 (!) 153/72     Systolic BP Percentile --      Diastolic BP Percentile --      Pulse Rate 01/11/24 1428 (!) 48     Resp 01/11/24 1428 16     Temp 01/11/24 1508 97.8 F (36.6 C)     Temp Source 01/11/24 1508 Oral     SpO2 01/11/24 1428 100 %     Weight 01/11/24 1415 94.6 kg (208 lb 8.9 oz)     Height 01/11/24 1415 1.651 m (5\' 5" )     Head Circumference --      Peak Flow --      Pain Score 01/11/24 1415 0     Pain Loc --      Pain Education --      Exclude from Growth Chart --     Most recent vital signs: Vitals:   01/11/24 1600 01/11/24 1630  BP: (!) 162/73 (!) 158/68  Pulse: (!) 44 (!) 43  Resp: 16 15  Temp:    SpO2: 100% 100%     General: Awake, no distress.  Alert and oriented x 3 CV:  Good peripheral perfusion.  Resp:  Normal effort.  Clear to auscultation bilaterally Abd:  No distention.  Soft, nontender Other:  Good strength in the lower extremities bilaterally, neuroexam is unremarkable.   ED Results / Procedures / Treatments   Labs (all labs ordered are listed, but only abnormal results are displayed) Labs Reviewed  COMPREHENSIVE METABOLIC PANEL WITH  GFR - Abnormal; Notable for the following components:      Result Value   Potassium 3.1 (*)    Chloride 112 (*)    Glucose, Bld 116 (*)    Creatinine, Ser 1.30 (*)    Total Protein 6.3 (*)    Albumin  2.6 (*)    AST 42 (*)    Total Bilirubin 1.9 (*)    GFR, Estimated 58 (*)    All other components within normal limits  CBC - Abnormal; Notable for the following components:   WBC 2.4 (*)    RBC 3.17 (*)    Hemoglobin 10.1 (*)    HCT 30.5 (*)    RDW 15.9 (*)    Platelets 90 (*)    All other components within normal limits  URINALYSIS, ROUTINE W REFLEX MICROSCOPIC - Abnormal; Notable for the following components:   Color, Urine YELLOW (*)    APPearance HAZY (*)    Leukocytes,Ua SMALL (*)    Bacteria, UA FEW (*)    All other components within normal limits  AMMONIA - Abnormal; Notable for the following components:   Ammonia 82 (*)    All other components within normal limits  CBG MONITORING, ED     EKG  ED ECG REPORT I, Bryson Carbine, the attending physician, personally viewed and interpreted this ECG.   Rhythm: Sinus bradycardia QRS Axis: normal Intervals: normal ST/T Wave abnormalities: normal Narrative Interpretation: no evidence of acute ischemia    RADIOLOGY CT head without acute abnormality    PROCEDURES:  Critical Care performed:   Procedures   MEDICATIONS ORDERED IN ED: Medications  lactulose  (CHRONULAC ) 10 GM/15ML solution 20 g (20 g Oral Given 01/11/24 1643)     IMPRESSION / MDM / ASSESSMENT AND PLAN / ED COURSE  I reviewed the triage vital signs and the nursing notes. Patient's presentation is most consistent with acute presentation with potential threat to life or bodily function.  Patient resents with generalized weakness as detailed above, overall well-appearing and in no acute distress, noted to be mildly bradycardic but this is unchanged from recent hospitalization  Review of records demonstrates the patient was discharged on May 9 after  treatment for hepatic encephalopathy  Differential for his weakness today includes electrolyte abnormalities, dehydration, hepatic encephalopathy.  He does not have a focal weakness to suggest CVA  Will check labs, ammonia, CT head, urinalysis chest x-ray and reevaluate  Lab work demonstrates elevated ammonia, normal CT head.  Does have some white blood cell clumping in his urinalysis however this was present 2 weeks ago during admission as well, he is afebrile  Given his history question whether hepatic encephalopathy may be contributing to his weakness, will give a dose of lactulose  here, have consulted the hospitalist for admission given his inability to ambulate secondary to weakness which is new for him.      FINAL CLINICAL IMPRESSION(S) / ED DIAGNOSES   Final diagnoses:  Hepatic encephalopathy (HCC)     Rx / DC Orders   ED Discharge Orders     None        Note:  This document was prepared using Dragon voice recognition software and may include unintentional dictation errors.   Bryson Carbine, MD 01/11/24 1700

## 2024-01-11 NOTE — H&P (Signed)
 History and Physical    [name]:[id number] DOB: Aug 22, 1951 DOA: 01/11/2024  PCP: [facility] (Confirm with patient/family/NH records and if not entered, this has to be entered at [facility] point of entry) Patient coming from: SNF  I have personally briefly reviewed patient's old medical records in [facility]  Chief Complaint: AMS  HPI: [name] is a 73 y.o. male with medical history significant of chronic hepatitis B and C, chronic liver cirrhosis, repeated hepatic encephalopathy, pancytopenia, anxiety/depression, HTN, HLD, IIDM, COPD, CAD, chronic HFpEF, TIA, migraines, chronic sinus bradycardia presented with altered mentations.  Patient is somewhat confused but provide me detailed history.  He reported that he takes lactulose  every day and usually to make 2-3 bowel movement.  However he became constipated this week and last bowel movement was 2 days ago. "  I can feel the change" and he stated up all night last night could not go to sleep.  This afternoon, patient was found to be very confused and sent to ED by nursing home staff.  Patient reported that he has been compliant with other medications and no significant weight gaining or increasing swelling.  Patient denied any cough no urinary symptoms.  ED Course: Afebrile, chronic bradycardia, blood pressure 150/70 O2 sat from 100% on room air blood work showed ammonium 82, WBC 2.4 hemoglobin 10.1 BUN 16 creatinine 1.3 albumin  2.6.  Patient was given lactulose  x 1 in the ED.  Review of Systems: As per HPI otherwise 14 point review of systems negative.    Past Medical History:  Diagnosis Date   (HFpEF) heart failure with preserved ejection fraction (HCC)    a.) TTE 03/20/2018: EF 60-65%, no RWMAs, mild LAE, mild-mod MR, PASP 42; b.) TTE 03/31/2021: EF 55-60%, no RWMAs, mild LVH, mild LAE, AoV sclerosis without stenosis; c.) TTE 03/25/2022: EF 60-65%, no RWMAs, AoV sclerosis without stenosis; d.) TTE  12/24/2022: EF 60-65%, no RWMAs, mild LVH, mild LAE.   Anasarca    Anemia    Anxiety    Aortic atherosclerosis (HCC)    Arthritis    Asthma    Bacteremia due to Escherichia coli 05/2023   Bacteremia due to Klebsiella pneumoniae 2023   Bilateral carotid artery disease (HCC)    Bradycardia    CAD (coronary artery disease) 10/21/2006   a.) MV 10/21/2006: small reversible defect involving the apical  lateral wall c/w possible ischemia; b.) MV: 03/24/2018: no ischemia; c.) MV 10/04/2021: LAD calcifications   CAD (coronary artery disease)    Cervical radiculopathy    Chest pain    a.) MV 03/21/2018: no ischemia; b.) MV 10/04/2021: no ischemia   Chronic back pain    Chronic common bile duct dilatation    Cirrhosis (HCC)    a.) (+) hepatic lesion on CT; likely hepatocellular carcinoma; b.) CT evidence of associated portal hypertension; c.) (+) abdominopelvic anasarca   COPD (chronic obstructive pulmonary disease) (HCC)    DDD (degenerative disc disease), lumbar    Depression    Frequent falls    GERD (gastroesophageal reflux disease)    HBV (hepatitis B virus) infection    HCV (hepatitis C virus)    Hernia of anterior abdominal wall    History of 2019 novel coronavirus disease (COVID-19) 04/16/2020   Homelessness    a.) limited on placement as patient is listed on [place] sex offender listing for exploitation of a minor (possession of child pornography); incarcerated 12 years (released 11/2017)   Hyperlipidemia  Hypertension    Infestation by bed bug 02/2022   Malingering    Migraine    Nephrolithiasis    Obesity    Portal hypertension (HCC)    PTSD (post-traumatic stress disorder)    a.) brief training in the [employer] at age 33; someone was killed in front of him   Pulmonary HTN (HCC)    a.) multiple CT scans desmontrating dilated PA c/w pHTN; b.) TTE 03/20/2018: PASP 42 mmHg   Sleep apnea    a.) no nocturnal PAP therapy   Sleep apnea    Stenosis of cervical spine with myelopathy  (HCC)    Suicidal ideations    T2DM (type 2 diabetes mellitus) (HCC)    Thrombocytopenia (HCC)    a.) related to hepatic cirrhosis; spleen normal in size   TIA (transient ischemic attack)    TIA (transient ischemic attack) 09/30/2023    Past Surgical History:  Procedure Laterality Date   ANTERIOR CERVICAL DECOMP/DISCECTOMY FUSION N/A 07/01/2023   Procedure: ANTERIOR CERVICAL DISCECTOMY AND FUSION (FORGE);  Surgeon: Jodeen Munch, MD;  Location: [facility] ORS;  Service: Neurosurgery;  Laterality: N/A;  Keep as last case of the day per Dr Mont Antis   ANTERIOR CERVICAL DISCECTOMY     APPENDECTOMY     CARDIAC CATHETERIZATION     CHOLECYSTECTOMY     EYE SURGERY     INNER EAR SURGERY     NOSE SURGERY     XI ROBOTIC LAPAROSCOPIC ASSISTED APPENDECTOMY N/A 11/05/2022   Procedure: XI ROBOTIC LAPAROSCOPIC ASSISTED APPENDECTOMY;  Surgeon: Eldred Grego, MD;  Location: [facility] ORS;  Service: General;  Laterality: N/A;     reports that he has been smoking cigarettes. He has never used smokeless tobacco. He reports that he does not currently use alcohol. He reports that he does not currently use drugs.  Allergies  Allergen Reactions   Bee Venom Anaphylaxis   Codeine  Itching    Only itching. Recently allergy tested at [facility]   Rsv Mrna Pre-F Virus Vaccine Swelling   Meloxicam     Other Reaction(s): ?overdose of local given at dentist--had trouble breathing   Codeine     Novocain [Procaine]    Sulfa Antibiotics Other (See Comments)   Sulfa Antibiotics     hallucinations    Family History  Problem Relation Age of Onset   Hypertension Mother    Heart disease Mother    Heart failure Maternal Grandmother      Prior to Admission medications   Medication Sig Start Date End Date Taking? Authorizing Provider  acetaminophen  (TYLENOL ) 650 MG CR tablet Take 650 mg by mouth every 8 (eight) hours as needed for pain.    [provider]  albuterol  (PROVENTIL ) (2.5 MG/3ML) 0.083%  nebulizer solution Take 2.5 mg by nebulization every 4 (four) hours as needed for wheezing or shortness of breath. 07/16/23   [provider]  aspirin  81 MG chewable tablet Chew 81 mg by mouth daily.    [provider]  atorvastatin  (LIPITOR) 40 MG tablet Take 40 mg by mouth daily.    [provider]  citalopram  (CELEXA ) 20 MG tablet Take 20 mg by mouth daily.    [provider]  feeding supplement (ENSURE ENLIVE / ENSURE PLUS) LIQD Take 237 mLs by mouth 3 (three) times daily between meals. 10/25/23   Lorita Rosa, MD  furosemide  (LASIX ) 20 MG tablet Take 1 tablet (20 mg total) by mouth daily. 01/03/24 02/02/24  Read Camel, MD  gabapentin  (NEURONTIN ) 300 MG capsule  Take 300 mg by mouth 2 (two) times daily. 06/07/23   [provider]  lactulose  (CHRONULAC ) 10 GM/15ML solution Take 45 mLs (30 g total) by mouth 3 (three) times daily. Please titrate to have 2-3 soft bowel movements daily 12/05/23   Amin, Sumayya, MD  Multiple Vitamin (MULTIVITAMIN WITH MINERALS) TABS tablet Take 1 tablet by mouth daily.    [provider]  nitroGLYCERIN  (NITROLINGUAL ) 0.4 MG/SPRAY spray Place 1 spray under the tongue every 5 (five) minutes x 3 doses as needed for chest pain. 10/30/23   Charlette Console, FNP  ondansetron  (ZOFRAN ) 4 MG tablet Take 1 tablet (4 mg total) by mouth every 6 (six) hours as needed for nausea. 06/21/23   Verla Glaze, MD  pantoprazole  (PROTONIX ) 40 MG tablet Take 40 mg by mouth daily. 09/24/23   [provider]  phenol (CHLORASEPTIC) 1.4 % LIQD Use as directed 1 spray in the mouth or throat as needed for throat irritation / pain.    [provider]  QUEtiapine  (SEROQUEL ) 50 MG tablet Take 50 mg by mouth at bedtime. 11/19/23   [provider]  senna (SENOKOT) 8.6 MG TABS tablet Take 1 tablet (8.6 mg total) by mouth 2 (two) times daily as needed for mild constipation. 07/02/23   Noble Bateman, PA  spironolactone   (ALDACTONE ) 25 MG tablet Take 0.5 tablets (12.5 mg total) by mouth daily. 01/03/24 03/03/24  Read Camel, MD  VENTOLIN  HFA 108 (90 Base) MCG/ACT inhaler Inhale 2 puffs into the lungs every 4 (four) hours as needed for wheezing. 07/16/23   [provider]  XIFAXAN  550 MG TABS tablet Take 550 mg by mouth 2 (two) times daily. 09/24/23   [provider]    Physical Exam: Vitals:   01/11/24 1630 01/11/24 1700 01/11/24 1730 01/11/24 1741  BP: (!) 158/68 (!) 163/67 (!) 151/74   Pulse: (!) 43 (!) 48 (!) 43   Resp: 15 17 14    Temp:    98.1 F (36.7 C)  TempSrc:      SpO2: 100% 100% 100%   Weight:      Height:        Constitutional: NAD, calm, comfortable Vitals:   01/11/24 1630 01/11/24 1700 01/11/24 1730 01/11/24 1741  BP: (!) 158/68 (!) 163/67 (!) 151/74   Pulse: (!) 43 (!) 48 (!) 43   Resp: 15 17 14    Temp:    98.1 F (36.7 C)  TempSrc:      SpO2: 100% 100% 100%   Weight:      Height:       Eyes: PERRL, lids and conjunctivae normal ENMT: Mucous membranes are moist. Posterior pharynx clear of any exudate or lesions.Normal dentition.  Neck: normal, supple, no masses, no thyromegaly Respiratory: clear to auscultation bilaterally, no wheezing, no crackles. Normal respiratory effort. No accessory muscle use.  Cardiovascular: Regular rate and rhythm, no murmurs / rubs / gallops. trace extremity edema. 2+ pedal pulses. No carotid bruits.  Abdomen: no tenderness, no masses palpated. No hepatosplenomegaly. Bowel sounds positive.  Musculoskeletal: no clubbing / cyanosis. No joint deformity upper and lower extremities. Good ROM, no contractures. Normal muscle tone.  Skin: no rashes, lesions, ulcers. No induration Neurologic: CN 2-12 grossly intact. Sensation intact, DTR normal. Strength 5/5 in all 4.  Psychiatric: Normal judgment and insight. Alert and oriented x 3. Normal mood.    Labs on Admission: I have personally reviewed following labs and imaging  studies  CBC: Recent Labs  Lab 01/11/24  1429  WBC 2.4*  HGB 10.1*  HCT 30.5*  MCV 96.2  PLT 90*   Basic Metabolic Panel: Recent Labs  Lab 01/11/24 1429  NA 140  K 3.1*  CL 112*  CO2 22  GLUCOSE 116*  BUN 16  CREATININE 1.30*  CALCIUM  9.1   GFR: Estimated Creatinine Clearance: 52.7 mL/min (A) (by C-G formula based on SCr of 1.3 mg/dL (H)). Liver Function Tests: Recent Labs  Lab 01/11/24 1429  AST 42*  ALT 27  ALKPHOS 63  BILITOT 1.9*  PROT 6.3*  ALBUMIN  2.6*   No results for input(s): "LIPASE", "AMYLASE" in the last 168 hours. Recent Labs  Lab 01/11/24 1524  AMMONIA 82*   Coagulation Profile: No results for input(s): "INR", "PROTIME" in the last 168 hours. Cardiac Enzymes: No results for input(s): "CKTOTAL", "CKMB", "CKMBINDEX", "TROPONINI" in the last 168 hours. BNP (last 3 results) No results for input(s): "PROBNP" in the last 8760 hours. HbA1C: No results for input(s): "HGBA1C" in the last 72 hours. CBG: Recent Labs  Lab 01/11/24 1432  GLUCAP 89   Lipid Profile: No results for input(s): "CHOL", "HDL", "LDLCALC", "TRIG", "CHOLHDL", "LDLDIRECT" in the last 72 hours. Thyroid  Function Tests: No results for input(s): "TSH", "T4TOTAL", "FREET4", "T3FREE", "THYROIDAB" in the last 72 hours. Anemia Panel: No results for input(s): "VITAMINB12", "FOLATE", "FERRITIN", "TIBC", "IRON", "RETICCTPCT" in the last 72 hours. Urine analysis:    Component Value Date/Time   COLORURINE YELLOW (A) 01/11/2024 1429   APPEARANCEUR HAZY (A) 01/11/2024 1429   LABSPEC 1.015 01/11/2024 1429   PHURINE 5.0 01/11/2024 1429   GLUCOSEU NEGATIVE 01/11/2024 1429   HGBUR NEGATIVE 01/11/2024 1429   BILIRUBINUR NEGATIVE 01/11/2024 1429   KETONESUR NEGATIVE 01/11/2024 1429   PROTEINUR NEGATIVE 01/11/2024 1429   NITRITE NEGATIVE 01/11/2024 1429   LEUKOCYTESUR SMALL (A) 01/11/2024 1429    Radiological Exams on Admission: DG Chest Port 1 View Result Date: 01/11/2024 CLINICAL  DATA:  Chest pain and weakness. EXAM: PORTABLE CHEST 1 VIEW COMPARISON:  11/29/2023 FINDINGS: The cardiac silhouette, mediastinal and hilar contours are within normal limits given the AP projection and portable technique. Stable tortuosity and calcification of the thoracic aorta. The lungs are clear an acute process. No pleural effusions or pulmonary lesions. The bony thorax is intact. Stable cervical fusion hardware. IMPRESSION: No acute cardiopulmonary findings. Electronically Signed   By: Marrian Siva M.D.   On: 01/11/2024 15:59   CT HEAD WO CONTRAST Result Date: 01/11/2024 CLINICAL DATA:  Provided history: Mental status change, unknown cause. Additional history provided: Lethargy. EXAM: CT HEAD WITHOUT CONTRAST TECHNIQUE: Contiguous axial images were obtained from the base of the skull through the vertex without intravenous contrast. RADIATION DOSE REDUCTION: This exam was performed according to the departmental dose-optimization program which includes automated exposure control, adjustment of the mA and/or kV according to patient size and/or use of iterative reconstruction technique. COMPARISON:  Head CT 12/30/2023. FINDINGS: Brain: Generalized cerebral atrophy. There is no acute intracranial hemorrhage. No demarcated cortical infarct. No extra-axial fluid collection. No evidence of an intracranial mass. No midline shift. Vascular: No hyperdense vessel.  Atherosclerotic calcifications. Skull: No calvarial fracture or aggressive osseous lesion. Sinuses/Orbits: No mass or acute finding within the imaged orbits. Prior surgical fixation of the superolateral right bony orbit. Small-volume secretions within a posterior right ethmoid air cell. Minimal mucosal thickening within the right maxillary sinus IMPRESSION: 1.  No acute intracranial finding. 2. Generalized parenchymal atrophy. 3. Mild paranasal sinus disease as described. Electronically Signed   By:  Bascom Lily D.O.   On: 01/11/2024 14:57    EKG:  Independently reviewed.  Sinus bradycardia, no acute PR or QTc interval changes  Assessment/Plan Principal Problem:   Hepatic encephalopathy (HCC) Active Problems:   Acute hepatic encephalopathy (HCC)  (please populate well all problems here in Problem List. (For example, if patient is on BP meds at home and you resume or decide to hold them, it is a problem that needs to be her. Same for CAD, COPD, HLD and so on)  Acute hepatic encephalopathy Liver cirrhosis - Increase lactulose  dosage to titrate 3 bowel movement a day - Continue rifaximin  - Continue Lasix  and spironolactone .  Increase spironolactone  to 50 mg daily to address hypokalemia.  Hypokalemia - P.o. replacement - Increase spironolactone   IIDM with borderline hypoglycemia - Most recent A1c 4.9, all diabetic medication discontinued on last admission. - Encourage patient to increase carbohydrate intake to avoid hypoglycemia episode.  HTN - Continue Lasix  and spironolactone   CAD CAD - No acute concern, continue aspirin  and Lipitor  Sinus bradycardia - Chronic, blood pressure stable - Not on any nodal control agents  Chronic pancytopenia - Secondary to liver cirrhosis - H&H and platelet count stable  COPD - No acute concern, continue as needed bronchodilator  CKD stage IIIa - Euvolemic, creatinine level stable, continue Lasix  and spironolactone   Chronic HFpEF - Mild overload, continue Lasix  and spironolactone      DVT prophylaxis: TED house Code Status: DNR Family Communication: None at bedside Disposition Plan: Expect less than 2 midnights hospital stay Consults called: None Admission status: Telemetry observation   [place] MD [facility] Pager [phone] 01/11/2024, 5:49 PM

## 2024-01-11 NOTE — ED Notes (Signed)
 Difficult start to get an IV. Pt stuck multiple time with success on the 4th stick.

## 2024-01-11 NOTE — ED Notes (Signed)
 This RN and Alvera Jock attempted to assist the patient to standing per MD request. Patient unable to bear any weight. RN's assisted patient back to bed. Patient repositioned. MD informed.

## 2024-01-11 NOTE — ED Notes (Signed)
 Per lab urine sample sent was not enough urine. Daria Eddy RN placed a new leg bag.

## 2024-01-12 DIAGNOSIS — K7682 Hepatic encephalopathy: Secondary | ICD-10-CM | POA: Diagnosis not present

## 2024-01-12 LAB — AMMONIA: Ammonia: 90 umol/L — ABNORMAL HIGH (ref 9–35)

## 2024-01-12 LAB — POTASSIUM: Potassium: 3.9 mmol/L (ref 3.5–5.1)

## 2024-01-12 LAB — CK: Total CK: 209 U/L (ref 49–397)

## 2024-01-12 MED ORDER — LACTULOSE 10 GM/15ML PO SOLN: 45.0000 g | Freq: Three times a day (TID) | ORAL | 10 refills | Status: DC

## 2024-01-12 MED ORDER — LACTULOSE 10 GM/15ML PO SOLN
45.0000 g | Freq: Three times a day (TID) | ORAL | Status: DC
  Administered 2024-01-12: 45 g via ORAL
  Filled 2024-01-12: qty 90

## 2024-01-12 MED ORDER — SPIRONOLACTONE 25 MG PO TABS: 25.0000 mg | ORAL_TABLET | Freq: Every day | ORAL | 1 refills | Status: DC

## 2024-01-12 MED ORDER — LACTULOSE ENEMA
300.0000 mL | Freq: Once | ORAL | Status: DC
  Filled 2024-01-12: qty 300

## 2024-01-12 NOTE — TOC Transition Note (Signed)
 Transition of Care [facility]) - Discharge Note   Patient Details  Name: [name] MRN: [id number] Date of Birth: 1951/05/25  Transition of Care [facility]) CM/SW Contact:  Crayton Docker, RN 01/12/2024, 11:15 AM   Clinical Narrative:     Per Dr. Ariel Begun, patient is medically ready to discharge and return to family care home. CM alert to Dr. Ariel Begun regarding FL2 for signature.  Patient to discharge and return to family care home. CM call to Owner/operator of [facility], Mr. Marcello Sero regarding pending discharge. Per Mr. Vernon Goodpasture, will pick up patient at 1230 today. CM alert to Dr. Ariel Begun and RN Ruthann Cover regarding transport time for per owner of [facility].   Final next level of care: Group Home Barriers to Discharge: No Barriers Identified   Patient Goals and CMS Choice    Family Care Home  Discharge Placement      Family Care Home         Discharge Plan and Services Additional resources added to the After Visit Summary for      Social Drivers of Health (SDOH) Interventions SDOH Screenings   Food Insecurity: No Food Insecurity (01/12/2024)  Housing: Low Risk  (01/12/2024)  Transportation Needs: No Transportation Needs (01/12/2024)  Utilities: Not At Risk (01/12/2024)  Alcohol Screen: Low Risk  (09/24/2022)  Depression (PHQ2-9): High Risk (04/26/2022)  Financial Resource Strain: High Risk (04/27/2022)  Physical Activity: Sufficiently Active (04/16/2018)  Social Connections: Socially Isolated (01/12/2024)  Stress: Stress Concern Present (04/16/2018)  Tobacco Use: High Risk (01/11/2024)     Readmission Risk Interventions    11/29/2023    5:03 PM 11/13/2023    2:21 PM 11/12/2023    4:00 PM  Readmission Risk Prevention Plan  Transportation Screening Not Complete  Not Complete  Medication Review Oceanographer) Not Complete Complete Not Complete  PCP or Specialist appointment within 3-5 days of discharge Not Complete Complete Complete  HRI or Home  Care Consult Not Complete Complete   SW Recovery Care/Counseling Consult Not Complete Complete   Palliative Care Screening Not Applicable Complete   Skilled Nursing Facility Not Applicable Not Applicable

## 2024-01-12 NOTE — Discharge Summary (Signed)
 Physician Discharge Summary   Patient: [name] MRN: [id number] DOB: 11-08-1950  Admit date:     01/11/2024  Discharge date: 01/12/24  Discharge Physician: Luna Salinas   PCP: [facility]   Recommendations at discharge:  Please obtain CBC and CMP on follow-up Please ensure patient has 2-3 soft bowel movements daily, lactulose  dose can be titrated, patient and caregiver need extensive education. Follow-up with primary care provider Follow-up with his primary gastroenterologist  Discharge Diagnoses: Principal Problem:   Hepatic encephalopathy (HCC) Active Problems:   Acute hepatic encephalopathy [facility])   Hospital Course: Taken from H&P.  [name] is a 73 y.o. male with medical history significant of chronic hepatitis B and C, chronic liver cirrhosis, repeated hepatic encephalopathy, pancytopenia, anxiety/depression, HTN, HLD, IIDM, COPD, CAD, chronic HFpEF, TIA, migraines, chronic sinus bradycardia presented with altered mentations.   Patient with multiple hospitalizations approximately 7 since February plus multiple ED visits.  For similar reasons.  He feels little change in mentation whenever constipated.  At this time no bowel movement for the past 2 days.    On presentation vital stable, patient has chronic bradycardia.  Labs with ammonia levels of 82, rest around his baseline.  5/17: Vital stable, repeat ammonia this morning was 90, hypokalemia resolved.  Patient and his group home need an extensive education that they can titrate the lactulose  dose to have 2-3 bowel movements and stop coming to ED for 1 day of constipation.  Patient is prone to develop hepatic encephalopathy due to advanced liver cirrhosis and this is her normal disease trajectory.  Patient should not be constipated.  Lactulose  dose can be increased or decreased as needed but he need to get minimum of 2 soft bowel movements daily.  His mentation is at baseline after getting  lactulose  and having a large bowel movement.  Ammonia levels were high but they were checked before having the bowel movement.  Patient has a chronic asymptomatic bradycardia.  Patient is high risk for readmission and mortality based on significant comorbidities and seems like very low healthcare literacy for the patient and his group home caregivers.  Patient should be given lactulose  either p.o. or enema in ED to avoid frequent hospitalizations.  He will continue current medications, spironolactone  dose was increased to 25 mg daily and he need to have a close follow-up with his providers especially gastroenterologist for management of his chronic liver cirrhosis.   Consultants: None Procedures performed: None Disposition: Group home Diet recommendation:  Discharge Diet Orders (From admission, onward)     Start     Ordered   01/12/24 0000  Diet - low sodium heart healthy        01/12/24 1123           Cardiac and Carb modified diet DISCHARGE MEDICATION: Allergies as of 01/12/2024       Reactions   Bee Venom Anaphylaxis   Codeine  Itching   Only itching. Recently allergy tested at [facility]   Rsv Mrna Pre-f Virus Vaccine Swelling   Meloxicam    Other Reaction(s): ?overdose of local given at dentist--had trouble breathing   Codeine     Novocain [procaine]    Sulfa Antibiotics Other (See Comments)   Sulfa Antibiotics    hallucinations        Medication List     TAKE these medications    acetaminophen  650 MG CR tablet Commonly known as: TYLENOL  Take 650 mg by mouth every 8 (eight) hours as needed for pain.  aspirin  81 MG chewable tablet Chew 81 mg by mouth daily.   atorvastatin  40 MG tablet Commonly known as: LIPITOR Take 40 mg by mouth daily.   citalopram  20 MG tablet Commonly known as: CELEXA  Take 20 mg by mouth daily.   feeding supplement Liqd Take 237 mLs by mouth 3 (three) times daily between meals.   furosemide  20 MG tablet Commonly known as:  LASIX  Take 1 tablet (20 mg total) by mouth daily.   gabapentin  300 MG capsule Commonly known as: NEURONTIN  Take 300 mg by mouth 2 (two) times daily.   lactulose  10 GM/15ML solution Commonly known as: CHRONULAC  Take 67.5 mLs (45 g total) by mouth 3 (three) times daily. Please titrate to have 2-3 soft bowel movements daily What changed: how much to take   multivitamin with minerals Tabs tablet Take 1 tablet by mouth daily.   nitroGLYCERIN  0.4 MG/SPRAY spray Commonly known as: NITROLINGUAL  Place 1 spray under the tongue every 5 (five) minutes x 3 doses as needed for chest pain.   ondansetron  4 MG tablet Commonly known as: ZOFRAN  Take 1 tablet (4 mg total) by mouth every 6 (six) hours as needed for nausea.   pantoprazole  40 MG tablet Commonly known as: PROTONIX  Take 40 mg by mouth daily.   phenol 1.4 % Liqd Commonly known as: CHLORASEPTIC Use as directed 1 spray in the mouth or throat as needed for throat irritation / pain.   QUEtiapine  50 MG tablet Commonly known as: SEROQUEL  Take 50 mg by mouth at bedtime.   senna 8.6 MG Tabs tablet Commonly known as: SENOKOT Take 1 tablet (8.6 mg total) by mouth 2 (two) times daily as needed for mild constipation.   spironolactone  25 MG tablet Commonly known as: ALDACTONE  Take 1 tablet (25 mg total) by mouth daily. What changed: how much to take   Ventolin  HFA 108 (90 Base) MCG/ACT inhaler Generic drug: albuterol  Inhale 2 puffs into the lungs every 4 (four) hours as needed for wheezing.   albuterol  (2.5 MG/3ML) 0.083% nebulizer solution Commonly known as: PROVENTIL  Take 2.5 mg by nebulization every 4 (four) hours as needed for wheezing or shortness of breath.   Xifaxan  550 MG Tabs tablet Generic drug: rifaximin  Take 550 mg by mouth 2 (two) times daily.        Follow-up Information     [facility] Follow up.   Why: Hospital follow up Contact information: [address] [place]  [address] [phone]                Discharge Exam: Cleavon Curls Weights   01/11/24 1415 01/11/24 1834  Weight: 94.6 kg 95.2 kg   General.  Frail elderly man, in no acute distress. Pulmonary.  Lungs clear bilaterally, normal respiratory effort. CV.  Regular rate and rhythm, no JVD, rub or murmur. Abdomen.  Soft, nontender, nondistended, BS positive. CNS.  Alert and oriented .  No focal neurologic deficit. Extremities.  No edema, no cyanosis, pulses intact and symmetrical.  Condition at discharge: stable  The results of significant diagnostics from this hospitalization (including imaging, microbiology, ancillary and laboratory) are listed below for reference.   Imaging Studies: DG Chest Port 1 View Result Date: 01/11/2024 CLINICAL DATA:  Chest pain and weakness. EXAM: PORTABLE CHEST 1 VIEW COMPARISON:  11/29/2023 FINDINGS: The cardiac silhouette, mediastinal and hilar contours are within normal limits given the AP projection and portable technique. Stable tortuosity and calcification of the thoracic aorta. The lungs are clear an acute process. No  pleural effusions or pulmonary lesions. The bony thorax is intact. Stable cervical fusion hardware. IMPRESSION: No acute cardiopulmonary findings. Electronically Signed   By: Marrian Siva M.D.   On: 01/11/2024 15:59   CT HEAD WO CONTRAST Result Date: 01/11/2024 CLINICAL DATA:  Provided history: Mental status change, unknown cause. Additional history provided: Lethargy. EXAM: CT HEAD WITHOUT CONTRAST TECHNIQUE: Contiguous axial images were obtained from the base of the skull through the vertex without intravenous contrast. RADIATION DOSE REDUCTION: This exam was performed according to the departmental dose-optimization program which includes automated exposure control, adjustment of the mA and/or kV according to patient size and/or use of iterative reconstruction technique. COMPARISON:  Head CT 12/30/2023. FINDINGS: Brain: Generalized cerebral  atrophy. There is no acute intracranial hemorrhage. No demarcated cortical infarct. No extra-axial fluid collection. No evidence of an intracranial mass. No midline shift. Vascular: No hyperdense vessel.  Atherosclerotic calcifications. Skull: No calvarial fracture or aggressive osseous lesion. Sinuses/Orbits: No mass or acute finding within the imaged orbits. Prior surgical fixation of the superolateral right bony orbit. Small-volume secretions within a posterior right ethmoid air cell. Minimal mucosal thickening within the right maxillary sinus IMPRESSION: 1.  No acute intracranial finding. 2. Generalized parenchymal atrophy. 3. Mild paranasal sinus disease as described. Electronically Signed   By: Bascom Lily D.O.   On: 01/11/2024 14:57   CT HEAD WO CONTRAST ( ) Result Date: 12/30/2023 CLINICAL DATA:  Altered mental status, weakness. EXAM: CT HEAD WITHOUT CONTRAST TECHNIQUE: Contiguous axial images were obtained from the base of the skull through the vertex without intravenous contrast. RADIATION DOSE REDUCTION: This exam was performed according to the departmental dose-optimization program which includes automated exposure control, adjustment of the mA and/or kV according to patient size and/or use of iterative reconstruction technique. COMPARISON:  CT head 11/29/2023. FINDINGS: Brain: No acute intracranial hemorrhage. No CT evidence of acute infarct. No edema, mass effect, or midline shift. The basilar cisterns are patent. Ventricles: The ventricles are normal. Vascular: No hyperdense vessel or unexpected calcification. Skull: No acute or aggressive finding. Chronic appearing deformity of the right lamina papyracea. Orbits: Orbits are symmetric. Sinuses: Focal secretions in the right posterior ethmoid air cells. Other: Mastoid air cells are clear. IMPRESSION: No CT evidence of acute intracranial abnormality. Electronically Signed   By: Denny Flack M.D.   On: 12/30/2023 18:07    Microbiology: Results  for orders placed or performed during the hospital encounter of 11/29/23  Urine Culture     Status: Abnormal   Collection Time: 11/29/23  4:38 PM   Specimen: Urine, Random  Result Value Ref Range Status   Specimen Description   Final    URINE, RANDOM Performed at [facility], [address] [place], [place] [address]    Special Requests   Final    NONE Reflexed from [id number] Performed at [facility], [address] [place], [place] [address]    Culture >=100,000 COLONIES/mL KLEBSIELLA PNEUMONIAE (A)  Final   Report Status 12/02/2023 FINAL  Final   Organism ID, Bacteria KLEBSIELLA PNEUMONIAE (A)  Final      Susceptibility   Klebsiella pneumoniae - MIC*    AMPICILLIN RESISTANT Resistant     CEFAZOLIN  <=4 SENSITIVE Sensitive     CEFEPIME  <=0.12 SENSITIVE Sensitive     CEFTRIAXONE  <=0.25 SENSITIVE Sensitive     CIPROFLOXACIN <=0.25 SENSITIVE Sensitive     GENTAMICIN <=1 SENSITIVE Sensitive     IMIPENEM <=0.25 SENSITIVE Sensitive     NITROFURANTOIN 32 SENSITIVE Sensitive  TRIMETH/SULFA <=20 SENSITIVE Sensitive     AMPICILLIN/SULBACTAM 4 SENSITIVE Sensitive     PIP/TAZO <=4 SENSITIVE Sensitive ug/mL    * >=100,000 COLONIES/mL KLEBSIELLA PNEUMONIAE  Resp panel by RT-PCR (RSV, Flu A&B, Covid) Anterior Nasal Swab     Status: None   Collection Time: 11/29/23  6:38 PM   Specimen: Anterior Nasal Swab  Result Value Ref Range Status   SARS Coronavirus 2 by RT PCR NEGATIVE NEGATIVE Final    Comment: (NOTE) SARS-CoV-2 target nucleic acids are NOT DETECTED.  The SARS-CoV-2 RNA is generally detectable in upper respiratory specimens during the acute phase of infection. The lowest concentration of SARS-CoV-2 viral copies this assay can detect is 138 copies/mL. A negative result does not preclude SARS-Cov-2 infection and should not be used as the sole basis for treatment or other patient management decisions. A negative result may occur with  improper specimen  collection/handling, submission of specimen other than nasopharyngeal swab, presence of viral mutation(s) within the areas targeted by this assay, and inadequate number of viral copies(<138 copies/mL). A negative result must be combined with clinical observations, patient history, and epidemiological information. The expected result is Negative.  Fact Sheet for Patients:  BloggerCourse.com  Fact Sheet for Healthcare Providers:  SeriousBroker.it  This test is no t yet approved or cleared by the [place]  FDA and  has been authorized for detection and/or diagnosis of SARS-CoV-2 by FDA under an Emergency Use Authorization (EUA). This EUA will remain  in effect (meaning this test can be used) for the duration of the COVID-19 declaration under Section 564(b)(1) of the Act, 21 U.S.C.section 360bbb-3(b)(1), unless the authorization is terminated  or revoked sooner.       Influenza A by PCR NEGATIVE NEGATIVE Final   Influenza B by PCR NEGATIVE NEGATIVE Final    Comment: (NOTE) The Xpert Xpress SARS-CoV-2/FLU/RSV plus assay is intended as an aid in the diagnosis of influenza from Nasopharyngeal swab specimens and should not be used as a sole basis for treatment. Nasal washings and aspirates are unacceptable for Xpert Xpress SARS-CoV-2/FLU/RSV testing.  Fact Sheet for Patients: BloggerCourse.com  Fact Sheet for Healthcare Providers: SeriousBroker.it  This test is not yet approved or cleared by the [place]  FDA and has been authorized for detection and/or diagnosis of SARS-CoV-2 by FDA under an Emergency Use Authorization (EUA). This EUA will remain in effect (meaning this test can be used) for the duration of the COVID-19 declaration under Section 564(b)(1) of the Act, 21 U.S.C. section 360bbb-3(b)(1), unless the authorization is terminated or revoked.     Resp Syncytial  Virus by PCR NEGATIVE NEGATIVE Final    Comment: (NOTE) Fact Sheet for Patients: BloggerCourse.com  Fact Sheet for Healthcare Providers: SeriousBroker.it  This test is not yet approved or cleared by the [place]  FDA and has been authorized for detection and/or diagnosis of SARS-CoV-2 by FDA under an Emergency Use Authorization (EUA). This EUA will remain in effect (meaning this test can be used) for the duration of the COVID-19 declaration under Section 564(b)(1) of the Act, 21 U.S.C. section 360bbb-3(b)(1), unless the authorization is terminated or revoked.  Performed at [facility], [address] [place], [place] [address]     Labs: CBC: Recent Labs  Lab 01/11/24 1429  WBC 2.4*  HGB 10.1*  HCT 30.5*  MCV 96.2  PLT 90*   Basic Metabolic Panel: Recent Labs  Lab 01/11/24 1429 01/12/24 0351  NA 140  --   K 3.1* 3.9  CL  112*  --   CO2 22  --   GLUCOSE 116*  --   BUN 16  --   CREATININE 1.30*  --   CALCIUM  9.1  --   MG 1.5*  --    Liver Function Tests: Recent Labs  Lab 01/11/24 1429  AST 42*  ALT 27  ALKPHOS 63  BILITOT 1.9*  PROT 6.3*  ALBUMIN  2.6*   CBG: Recent Labs  Lab 01/11/24 1432 01/11/24 1842  GLUCAP 89 87    Discharge time spent: greater than 30 minutes.  This record has been created using Conservation officer, historic buildings. Errors have been sought and corrected,but may not always be located. Such creation errors do not reflect on the standard of care.   Signed: Luna Salinas, MD [facility] 01/12/2024

## 2024-01-12 NOTE — Evaluation (Signed)
 Physical Therapy Evaluation Patient Details Name: [name] MRN: [id number] DOB: 07-28-1951 Today's Date: 01/12/2024  History of Present Illness  [name] is a 72yoM who comes to [facility] with concerns for ammonia elevation. Has had weakness in legs recently, opting to remain in bed more than usual. PMH: HTN, DM2, dCHF, COPD, homelessness, syncope, bradycardia, hepatitis B and C, orthostatic hypotension, TIA, PTSD, ACDF 2024.  Clinical Impression  Pt moving generally well from a strength and balance standpoint, however he has some difficulties pertaining to his visual deficits which create safety limitations- for instance: difficulty locating his very full urine leg bag prior to performing mobility to EOB and pushing RW into several obstacles. Pt setup for pericare performance, also limited in quality due to visual deficits. Pt able ot AMB 176ft without signs of exertion, set up in chair at end of session with MD in room. Pt likely close to baseline, but still with frank deficits and would benefit from PT services at DC.       If plan is discharge home, recommend the following: A little help with walking and/or transfers;A little help with bathing/dressing/bathroom;Assistance with cooking/housework;Assist for transportation;Help with stairs or ramp for entrance   Can travel by private vehicle        Equipment Recommendations None recommended by PT  Recommendations for Other Services       Functional Status Assessment Patient has had a recent decline in their functional status and demonstrates the ability to make significant improvements in function in a reasonable and predictable amount of time.     Precautions / Restrictions Precautions Precautions: Fall Restrictions Weight Bearing Restrictions Per Provider Order: No      Mobility  Bed Mobility Overal bed mobility: Needs Assistance Bed Mobility: Supine to Sit     Supine to sit: Supervision           Transfers Overall transfer level: Needs assistance Equipment used: Rolling walker (2 wheels) Transfers: Sit to/from Stand     Step pivot transfers: Supervision            Ambulation/Gait Ambulation/Gait assistance: Contact guard assist Gait Distance (Feet): 100 Feet (could easily have gone farther) Assistive device: Rolling walker (2 wheels) Gait Pattern/deviations: Step-through pattern          Stairs            Wheelchair Mobility     Tilt Bed    Modified Rankin (Stroke Patients Only)       Balance                                             Pertinent Vitals/Pain Pain Assessment Pain Assessment:  (no acute pain, only chronic pain.)    Home Living Family/patient expects to be discharged to:: Group home Living Arrangements: Group Home Available Help at Discharge: Personal care attendant Type of Home: Group Home Home Access: Ramped entrance       Home Layout: One level Home Equipment:  (unable to obtain any direct responses to questions when asked several times.)      Prior Function                       Extremity/Trunk Assessment                Communication        Cognition Arousal:  (seems  close to baseline, good evidence for underlying mild cognitive impairment.)                                       Cueing       General Comments      Exercises Other Exercises Other Exercises: standing at recliner, pericare performance  supervision Other Exercises: assisted with leg bag emptying due to visual field and perceptual deficits   Assessment/Plan    PT Assessment Patient needs continued PT services  PT Problem List Decreased strength;Decreased balance;Decreased mobility;Decreased cognition       PT Treatment Interventions DME instruction;Gait training;Functional mobility training;Therapeutic activities;Therapeutic exercise;Balance training;Patient/family education    PT Goals  (Current goals can be found in the Care Plan section)  Acute Rehab PT Goals PT Goal Formulation: All assessment and education complete, DC therapy    Frequency Min 2X/week     Co-evaluation               AM-PAC PT "6 Clicks" Mobility  Outcome Measure Help needed turning from your back to your side while in a flat bed without using bedrails?: None Help needed moving from lying on your back to sitting on the side of a flat bed without using bedrails?: None Help needed moving to and from a bed to a chair (including a wheelchair)?: A Little Help needed standing up from a chair using your arms (e.g., wheelchair or bedside chair)?: A Little Help needed to walk in hospital room?: A Little Help needed climbing 3-5 steps with a railing? : A Little 6 Click Score: 20    End of Session Equipment Utilized During Treatment: Gait belt Activity Tolerance: Patient tolerated treatment well;No increased pain Patient left: in chair;with call bell/phone within reach;with chair alarm set Nurse Communication: Mobility status;Precautions PT Visit Diagnosis: Other abnormalities of gait and mobility (R26.89);Unsteadiness on feet (R26.81);Muscle weakness (generalized) (M62.81);History of falling (Z91.81)    Time: 1610-9604 PT Time Calculation (min) (ACUTE ONLY): 30 min   Charges:   PT Evaluation $PT Eval Moderate Complexity: 1 Mod PT Treatments $Self Care/Home Management: 8-22 PT General Charges $$ ACUTE PT VISIT: 1 Visit        12:27 PM, 01/12/24 Dawn Eth, PT, DPT Physical Therapist - [facility]  [phone] (ASCOM)    Damesha Lawler C 01/12/2024, 12:24 PM

## 2024-01-12 NOTE — Care Management Obs Status (Signed)
 MEDICARE OBSERVATION STATUS NOTIFICATION   Patient Details  Name: HERMILO DUTTER MRN: [id number] Date of Birth: 1950/12/25   Medicare Observation Status Notification Given:  Yes    [place] L Jobeth Pangilinan, LCSW 01/12/2024, 10:14 AM

## 2024-01-12 NOTE — Hospital Course (Addendum)
 Taken from H&P.  [name] is a 73 y.o. male with medical history significant of chronic hepatitis B and C, chronic liver cirrhosis, repeated hepatic encephalopathy, pancytopenia, anxiety/depression, HTN, HLD, IIDM, COPD, CAD, chronic HFpEF, TIA, migraines, chronic sinus bradycardia presented with altered mentations.   Patient with multiple hospitalizations approximately 7 since February plus multiple ED visits.  For similar reasons.  He feels little change in mentation whenever constipated.  At this time no bowel movement for the past 2 days.    On presentation vital stable, patient has chronic bradycardia.  Labs with ammonia levels of 82, rest around his baseline.  5/17: Vital stable, repeat ammonia this morning was 90, hypokalemia resolved.  Patient and his group home need an extensive education that they can titrate the lactulose  dose to have 2-3 bowel movements and stop coming to ED for 1 day of constipation.  Patient is prone to develop hepatic encephalopathy due to advanced liver cirrhosis and this is her normal disease trajectory.  Patient should not be constipated.  Lactulose  dose can be increased or decreased as needed but he need to get minimum of 2 soft bowel movements daily.  His mentation is at baseline after getting lactulose  and having a large bowel movement.  Ammonia levels were high but they were checked before having the bowel movement.  Patient has a chronic asymptomatic bradycardia.  Patient is high risk for readmission and mortality based on significant comorbidities and seems like very low healthcare literacy for the patient and his group home caregivers.  Patient should be given lactulose  either p.o. or enema in ED to avoid frequent hospitalizations.  He will continue current medications, spironolactone  dose was increased to 25 mg daily and he need to have a close follow-up with his providers especially gastroenterologist for management of his chronic liver  cirrhosis.

## 2024-01-12 NOTE — [place] FL2 (Signed)
   MEDICAID [id number] LEVEL OF CARE FORM     IDENTIFICATION  Patient Name: [name] Birthdate: 1951-07-12 Sex: male Admission Date (Current Location): 01/11/2024  [place] Number:  [name] [id number] Facility and Address:  [facility], [address] [place], [place] [address]      Provider Number: [id number]  Attending Physician Name and Address:  Luna Salinas, MD  Relative Name and Phone Number:  Marcello Sero, owner of group home, phone: [phone]    Current Level of Care: Hospital Recommended Level of Care: Family Care Home (Return to [facility]) Prior Approval Number:    Date Approved/Denied: 03/26/22 PASRR Number: [id number]  Discharge Plan: Other (Comment) (Family Care Home)    Current Diagnoses: Patient Active Problem List   Diagnosis Date Noted   Chronic indwelling Foley catheter 01/03/2024   Hypomagnesemia 12/30/2023   Depression with anxiety 12/30/2023   Altered mental status 11/12/2023   Type II diabetes mellitus with renal manifestations (HCC) 10/21/2023   Chronic kidney disease, stage 3a (HCC) 10/21/2023   Myocardial injury 10/21/2023   CKD stage 3a, GFR 45-59 ml/min (HCC) - baseline scr 1.3 10/01/2023   Hepatic encephalopathy (HCC) 09/30/2023   Liver cirrhosis (HCC) 09/30/2023   Essential hypertension 09/30/2023   Diabetes mellitus type 2, noninsulin dependent (HCC) 09/30/2023   CAD (coronary artery disease) 09/30/2023   Hyperlipidemia 09/30/2023   Hepatic encephalopathy (HCC) 08/01/2023   Hyperammonemia (HCC) 07/30/2023   Cervical spinal stenosis 07/01/2023   Cervical myelopathy (HCC) 06/21/2023   Cervical stenosis of spine 06/21/2023   Cervical radiculopathy 06/20/2023   E coli bacteremia 06/17/2023   UTI (urinary tract infection) 06/17/2023   Left leg cellulitis 03/23/2023   Leukocytosis 03/23/2023   Serum total bilirubin elevated 03/23/2023   Left leg pain 03/23/2023   Suicidal  ideation 01/14/2023   Acute pain 01/14/2023   Adrenal insufficiency (HCC) 12/26/2022   Orthostatic hypotension 12/24/2022   Rib pain on right side 12/24/2022   Hepatitis B infection 12/24/2022   Myocardial ischemia 12/24/2022   Acute appendicitis 11/05/2022   Pancytopenia (HCC) 11/05/2022   Sinus bradycardia 11/05/2022   Acute metabolic encephalopathy 09/22/2022   Liver lesion 09/22/2022   Type 2 diabetes mellitus with hyperlipidemia (HCC) 09/22/2022   Obesity (BMI 30-39.9) 09/22/2022   Acute hepatic encephalopathy (HCC) 09/22/2022   MDD (major depressive disorder), recurrent severe, 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   Major depressive disorder, 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 psychotic features (HCC) 09/15/2022   Protein-calorie malnutrition, severe (HCC) 08/31/2022   Hypotension 08/31/2022   Weakness    Hx of transient ischemic attack (TIA) 04/28/2022   Obesity    Chronic diastolic CHF (congestive heart failure) (HCC)    Depression    Sepsis (HCC) 03/28/2022   Bacteremia due to Klebsiella pneumoniae 03/28/2022   Hypoglycemia 03/28/2022   Syncope 03/24/2022   Hypokalemia 03/24/2022   Fever 03/24/2022   COPD (chronic obstructive pulmonary disease) (HCC)    Elevated troponin    Abnormal LFTs    History of hepatitis C    AKI (acute kidney injury) (HCC)    Pure hypercholesterolemia    Obesity, Class III, BMI 40-49.9 (morbid obesity) 04/18/2020   Thrombocytopenia (HCC) 04/17/2020   Leukopenia 04/17/2020   HTN (hypertension) 04/17/2018   Uncontrolled type 2 diabetes mellitus with hyperglycemia, without long-term current use of insulin  (HCC) 04/17/2018   Lymphedema 04/17/2018   Normocytic anemia 04/07/2018   Chest pain 03/20/2018   Unsheltered homelessness 12/16/2017    Orientation RESPIRATION BLADDER Height &  Weight     Time, Self, Situation, Place  Normal Continent Weight: 95.2 kg Height:  5\' 5"  (165.1 cm)  BEHAVIORAL SYMPTOMS/MOOD NEUROLOGICAL BOWEL NUTRITION STATUS       Incontinent Diet (Please see discharge summary)  AMBULATORY STATUS COMMUNICATION OF NEEDS Skin   Limited Assist Verbally  (Jaundice, Ecchymosis, bilateral arms, non-tenting)                       Personal Care Assistance Level of Assistance  Bathing, Feeding, Dressing, Total care Bathing Assistance: Limited assistance Feeding assistance: Limited assistance Dressing Assistance: Limited assistance Total Care Assistance: Limited assistance   Functional Limitations Info    Sight Info: Impaired        SPECIAL CARE FACTORS FREQUENCY                       Contractures      Additional Factors Info  Code Status Code Status Info: DNR Allergies Info: Bee Venom  High - Anaphylaxis  Codeine   High - Itching Comments  Rsv Mrna Pre-f Virus Vaccine  High - Swelling  Meloxicam  Medium - No reactions specified Comments  Codeine   No severity or reactions specified  Novocain (procaine)  No severity or reactions specified  Sulfa Antibiotics  No severity specified - Other (See Comments)  Sulfa Antibiotics  No severity or reactions specified           Current Medications (01/12/2024):  This is the current hospital active medication list Current Facility-Administered Medications  Medication Dose Route Frequency Provider Last Rate Last Admin   acetaminophen  (TYLENOL ) tablet 650 mg  650 mg Oral Q8H PRN Antoniette Batty T, MD   650 mg at 01/11/24 2032   albuterol  (PROVENTIL ) (2.5 MG/3ML) 0.083% nebulizer solution 2.5 mg  2.5 mg Nebulization Q4H PRN [place], MD       aspirin  chewable tablet 81 mg  81 mg Oral Daily Antoniette Batty T, MD       atorvastatin  (LIPITOR) tablet 40 mg  40 mg Oral Daily Antoniette Batty T, MD       citalopram  (CELEXA ) tablet 20 mg  20 mg Oral Daily Antoniette Batty T, MD       feeding supplement (ENSURE ENLIVE / ENSURE PLUS) liquid 237 mL  237 mL Oral TID BM Zhang, Ping T, MD   237 mL at 01/11/24 2204   furosemide  (LASIX ) tablet 20 mg  20 mg Oral Daily Antoniette Batty T, MD        gabapentin  (NEURONTIN ) capsule 300 mg  300 mg Oral BID Antoniette Batty T, MD   300 mg at 01/11/24 2204   lactulose  (CHRONULAC ) 10 GM/15ML solution 45 g  45 g Oral TID Amin, Sumayya, MD       lactulose  (CHRONULAC ) enema 200 gm  300 mL Rectal Once Amin, Sumayya, MD       ondansetron  (ZOFRAN ) tablet 4 mg  4 mg Oral Q6H PRN Antoniette Batty T, MD       Or   ondansetron  (ZOFRAN ) injection 4 mg  4 mg Intravenous Q6H PRN Antoniette Batty T, MD       pantoprazole  (PROTONIX ) EC tablet 40 mg  40 mg Oral Daily Zhang, Ping T, MD       QUEtiapine  (SEROQUEL ) tablet 50 mg  50 mg Oral QHS Antoniette Batty T, MD   50 mg at 01/11/24 2204   rifaximin  (XIFAXAN ) tablet 550 mg  550 mg Oral BID Antoniette Batty  T, MD   550 mg at 01/11/24 2204   senna (SENOKOT) tablet 8.6 mg  1 tablet Oral BID PRN [place], MD       spironolactone  (ALDACTONE ) tablet 50 mg  50 mg Oral Daily [place], MD         Discharge Medications: Please see discharge summary for a list of discharge medications.  Relevant Imaging Results:  Relevant Lab Results:   Additional Information SSN: [id number]  Crayton Docker, RN

## 2024-01-29 ENCOUNTER — Telehealth: Payer: Self-pay | Admitting: Family

## 2024-01-29 NOTE — Progress Notes (Deleted)
 Advanced [facility] Failure Clinic Note    PCP: [name]  Primary Cardiologist: Drew Boyden, MD (last seen 02/22)  Chief Complaint: fatigue   HPI:  [name] is a 73 y/o male with a history of T2DM, HTN, COPD, migraines, hepatitis C, w/ likely underlying HCC, cirrhosis, TIA, depression, anxiety obstructive sleep apnea, cervical stenosis status post C3-6 ACDF, hyperlipidemia, CKD and chronic heart failure.  Echo 04/01/21: EF of 55-60% along with mild LVH/ LAE. Echo 03/25/22: EF 60-65% Echo 12/24/22: EF 60-65% along with mild LVH, mild LAE.    Admitted 06/17/23 due to dysuria, was found to have E. coli bacteremia. Having some falls, patient having abdominal pain and weight loss. Abd/ pelvic CT stable. MRI of cervical spine shows C4-5 severe spinal stenosis with cord compression. Neurosurgery consulted. Antibiotics provided.  Admitted 07/01/23 due to cervical myelopathy and cervical stenosis status post C3-6 ACDF. He was admitted overnight for pain control, therapy evaluation, and drain output monitoring. His postoperative course was complicated by worsening thrombocytopenia which improved back to his baseline with 1 unit of platelets. His drain output reduced to an acceptable level and was removed on the afternoon of postop day 1 and discharged the following day.   Admitted 07/29/23 due to AMS and a fall. Had not been taking his lactulose  due to diarrhea & generalized weakness. CXR normal. Head CT normal. Palliative care consulted due to multiple comorbidities. PT consulted. Mental status improved and discharged to group home. 2 December ED visits due to weakness/ tremors.   Admitted 10/21/23 with acute onset of AMS thought to be related to hepatic encephalopathy. Ammonia 185 -> 25 with improvement of mental status. Daily oral protein recommended. Lactulose  to be titrated for 2-3 soft stools daily. Losartan  & spironolactone  resumed. EKG while admitted showed junctional  bradycardia.  He presents today for a HF follow-up visit with a chief complaint of minimal fatigue. Has occasional chest pain and chronic difficulty sleeping along with this. Denies shortness of breath, palpitations, abdominal distention, pedal edema or dizziness. Getting daily BP/ weight and group home log reviewed.   ROS: All systems negative except as listed in HPI, PMH and Problem List.  SH:  Social History   Socioeconomic History   Marital status: Single    Spouse name: Not on file   Number of children: 2   Years of education: 12   Highest education level: 12th grade  Occupational History   Occupation: retired  Tobacco Use   Smoking status: Every Day    Types: Cigarettes   Smokeless tobacco: Never  Vaping Use   Vaping status: Not on file  Substance and Sexual Activity   Alcohol use: Not Currently   Drug use: Not Currently   Sexual activity: Not Currently    Birth control/protection: None  Other Topics Concern   Not on file  Social History Narrative   ** Merged History Encounter **       Lives in [facility] care home   Social Drivers of Health   Financial Resource Strain: High Risk (04/27/2022)   Overall Financial Resource Strain (CARDIA)    Difficulty of Paying Living Expenses: Very hard  Food Insecurity: No Food Insecurity (01/12/2024)   Hunger Vital Sign    Worried About Running Out of Food in the Last Year: Never true    Ran Out of Food in the Last Year: Never true  Transportation Needs: No Transportation Needs (01/12/2024)   PRAPARE - Administrator, Civil Service (Medical): No  Lack of Transportation (Non-Medical): No  Physical Activity: Sufficiently Active (04/16/2018)   Exercise Vital Sign    Days of Exercise per Week: 7 days    Minutes of Exercise per Session: 30 min  Stress: Stress Concern Present (04/16/2018)   [name]    Feeling of Stress : Very much  Social Connections:  Socially Isolated (01/12/2024)   Social Connection and Isolation Panel [NHANES]    Frequency of Communication with Friends and Family: Once a week    Frequency of Social Gatherings with Friends and Family: Twice a week    Attends Religious Services: Never    Database administrator or Organizations: No    Attends Banker Meetings: Never    Marital Status: Never married  Intimate Partner Violence: Not At Risk (01/12/2024)   [name], Afraid, [name], [name]    Fear of Current or Ex-Partner: No    Emotionally Abused: No    Physically Abused: No    Sexually Abused: No    FH:  Family History  Problem Relation Age of Onset   Hypertension Mother    Heart disease Mother    Heart failure Maternal Grandmother     Past Medical History:  Diagnosis Date   (HFpEF) heart failure with preserved ejection fraction (HCC)    a.) TTE 03/20/2018: EF 60-65%, no RWMAs, mild LAE, mild-mod MR, PASP 42; b.) TTE 03/31/2021: EF 55-60%, no RWMAs, mild LVH, mild LAE, AoV sclerosis without stenosis; c.) TTE 03/25/2022: EF 60-65%, no RWMAs, AoV sclerosis without stenosis; d.) TTE 12/24/2022: EF 60-65%, no RWMAs, mild LVH, mild LAE.   Anasarca    Anemia    Anxiety    Aortic atherosclerosis (HCC)    Arthritis    Asthma    Bacteremia due to Escherichia coli 05/2023   Bacteremia due to Klebsiella pneumoniae 2023   Bilateral carotid artery disease (HCC)    Bradycardia    CAD (coronary artery disease) 10/21/2006   a.) MV 10/21/2006: small reversible defect involving the apical  lateral wall c/w possible ischemia; b.) MV: 03/24/2018: no ischemia; c.) MV 10/04/2021: LAD calcifications   CAD (coronary artery disease)    Cervical radiculopathy    Chest pain    a.) MV 03/21/2018: no ischemia; b.) MV 10/04/2021: no ischemia   Chronic back pain    Chronic common bile duct dilatation    Cirrhosis (HCC)    a.) (+) hepatic lesion on CT; likely hepatocellular carcinoma; b.) CT evidence of  associated portal hypertension; c.) (+) abdominopelvic anasarca   COPD (chronic obstructive pulmonary disease) (HCC)    DDD (degenerative disc disease), lumbar    Depression    Frequent falls    GERD (gastroesophageal reflux disease)    HBV (hepatitis B virus) infection    HCV (hepatitis C virus)    Hernia of anterior abdominal wall    History of 2019 novel coronavirus disease (COVID-19) 04/16/2020   Homelessness    a.) limited on placement as patient is listed on [place] sex offender listing for exploitation of a minor (possession of child pornography); incarcerated 12 years (released 11/2017)   Hyperlipidemia    Hypertension    Infestation by bed bug 02/2022   Malingering    Migraine    Nephrolithiasis    Obesity    Portal hypertension (HCC)    PTSD (post-traumatic stress disorder)    a.) brief training in [employer] at age 81; someone was killed in  front of him   Pulmonary HTN (HCC)    a.) multiple CT scans desmontrating dilated PA c/w pHTN; b.) TTE 03/20/2018: PASP 42 mmHg   Sleep apnea    a.) no nocturnal PAP therapy   Sleep apnea    Stenosis of cervical spine with myelopathy (HCC)    Suicidal ideations    T2DM (type 2 diabetes mellitus) (HCC)    Thrombocytopenia (HCC)    a.) related to hepatic cirrhosis; spleen normal in size   TIA (transient ischemic attack)    TIA (transient ischemic attack) 09/30/2023    Current Outpatient Medications  Medication Sig Dispense Refill   acetaminophen  (TYLENOL ) 650 MG CR tablet Take 650 mg by mouth every 8 (eight) hours as needed for pain.     albuterol  (PROVENTIL ) (2.5 MG/3ML) 0.083% nebulizer solution Take 2.5 mg by nebulization every 4 (four) hours as needed for wheezing or shortness of breath.     aspirin  81 MG chewable tablet Chew 81 mg by mouth daily.     atorvastatin  (LIPITOR) 40 MG tablet Take 40 mg by mouth daily.     citalopram  (CELEXA ) 20 MG tablet Take 20 mg by mouth daily.     feeding supplement (ENSURE ENLIVE / ENSURE PLUS)  LIQD Take 237 mLs by mouth 3 (three) times daily between meals. 237 mL 12   furosemide  (LASIX ) 20 MG tablet Take 1 tablet (20 mg total) by mouth daily. 30 tablet 0   gabapentin  (NEURONTIN ) 300 MG capsule Take 300 mg by mouth 2 (two) times daily.     lactulose  (CHRONULAC ) 10 GM/15ML solution Take 67.5 mLs (45 g total) by mouth 3 (three) times daily. Please titrate to have 2-3 soft bowel movements daily 946 mL 10   Multiple Vitamin (MULTIVITAMIN WITH MINERALS) TABS tablet Take 1 tablet by mouth daily.     nitroGLYCERIN  (NITROLINGUAL ) 0.4 MG/SPRAY spray Place 1 spray under the tongue every 5 (five) minutes x 3 doses as needed for chest pain. 12 g 5   ondansetron  (ZOFRAN ) 4 MG tablet Take 1 tablet (4 mg total) by mouth every 6 (six) hours as needed for nausea. 20 tablet 0   pantoprazole  (PROTONIX ) 40 MG tablet Take 40 mg by mouth daily.     phenol (CHLORASEPTIC) 1.4 % LIQD Use as directed 1 spray in the mouth or throat as needed for throat irritation / pain.     QUEtiapine  (SEROQUEL ) 50 MG tablet Take 50 mg by mouth at bedtime.     senna (SENOKOT) 8.6 MG TABS tablet Take 1 tablet (8.6 mg total) by mouth 2 (two) times daily as needed for mild constipation. 60 tablet 0   spironolactone  (ALDACTONE ) 25 MG tablet Take 1 tablet (25 mg total) by mouth daily. 30 tablet 1   VENTOLIN  HFA 108 (90 Base) MCG/ACT inhaler Inhale 2 puffs into the lungs every 4 (four) hours as needed for wheezing.     XIFAXAN  550 MG TABS tablet Take 550 mg by mouth 2 (two) times daily.     No current facility-administered medications for this visit.   There were no vitals filed for this visit.  Wt Readings from Last 3 Encounters:  01/11/24 209 lb 14.1 oz (95.2 kg)  01/03/24 211 lb 3.2 oz (95.8 kg)  12/21/23 178 lb 9.2 oz (81 kg)   Lab Results  Component Value Date   CREATININE 1.30 (H) 01/11/2024   CREATININE 1.39 (H) 01/02/2024   CREATININE 1.35 (H) 01/01/2024    PHYSICAL EXAM:  General: Well appearing.  No resp  difficulty HEENT: normal Neck: supple, no JVD Cor: Regular rhythm, bradycardic. No rubs, gallops or murmurs Lungs: clear Abdomen: soft, nontender, nondistended. Extremities: no cyanosis, clubbing, rash, edema Neuro: alert & oriented X 3. Moves all 4 extremities w/o difficulty. Affect pleasant   ECG: not done   ASSESSMENT & PLAN:  1: NICM with preserved ejection fraction- - likely due to HTN - NYHA class II - euvolemic - being weighed daily at the group home; reminded to call for overnight weight gain of > 3 pounds or weekly weight gain of > 5 pounds - Echo 03/20/18: EF of 60-65% along with mild/moderate MR and a mildly elevated PA pressure of 42 mm Hg - Echo 04/01/21: EF of 55-60% along with mild LVH/ LAE. - Echo 03/25/22: EF 60-65% - Echo 12/24/22: EF 60-65% along with mild LVH, mild LAE.   - continue furosemide  20mg  daily - continue jardiance  10mg  daily - continue losartan  50mg  daily - continue spironolactone  25mg  daily - had telemedicine visit with cardiology Jerelene Monday) 02/22 - cardiology referral placed - BNP 10/21/23 reviewed and was 250.6  2: HTN- - BP 106/62 - sees PCP at [facility] Lydia Sams)  - BMP 10/24/23 reviewed and showed sodium 139, potassium 3.5, creatinine 0.95 and GFR >60 - BMET today  3: DM- - A1c 09/30/23 reviewed and was 4.9%  4: Liver cirrhosis- - likely secondary hepatitis C w/ positive viral load in 2019 - acute viral panel 02/24 demonstrated positive for hepatitis B antigen.  - was given lactulose  during recent admission and now it's to be given so that he has 2-3 soft stools daily  5: Junctional rhythm- - was diagnosed during recent admission - not on any blocking agents - EP referral placed today   Return in 3 months, sooner if needed.   Charlette Console, FNP 01/29/24

## 2024-01-29 NOTE — Telephone Encounter (Signed)
 Called to confirm/remind patient of their appointment at the [facility] on 01/30/24.   Appointment:   [] Confirmed  [] Left mess   [] No answer/No voice mail  [x] VM Full/unable to leave message  [] Phone not in service  Patient reminded to bring all medications and/or complete list.  Confirmed patient has transportation. Gave directions, instructed to utilize valet parking.

## 2024-01-30 ENCOUNTER — Encounter: Admitting: Family

## 2024-01-30 ENCOUNTER — Telehealth: Payer: Self-pay | Admitting: Family

## 2024-01-30 NOTE — Telephone Encounter (Signed)
 Called to confirm/remind patient of their appointment at the [facility] on 01/31/24.   Appointment:   [x] Confirmed  [] Left mess   [] No answer/No voice mail  [] VM Full/unable to leave message  [] Phone not in service  Patient reminded to bring all medications and/or complete list.  Confirmed patient has transportation. Gave directions, instructed to utilize valet parking.

## 2024-01-31 ENCOUNTER — Ambulatory Visit: Attending: Family | Admitting: Family

## 2024-01-31 ENCOUNTER — Encounter: Payer: Self-pay | Admitting: Family

## 2024-01-31 VITALS — BP 121/68 | HR 46 | Wt 218.0 lb

## 2024-01-31 DIAGNOSIS — Z5986 Financial insecurity: Secondary | ICD-10-CM | POA: Diagnosis not present

## 2024-01-31 DIAGNOSIS — G4733 Obstructive sleep apnea (adult) (pediatric): Secondary | ICD-10-CM | POA: Diagnosis not present

## 2024-01-31 DIAGNOSIS — E785 Hyperlipidemia, unspecified: Secondary | ICD-10-CM | POA: Insufficient documentation

## 2024-01-31 DIAGNOSIS — E1122 Type 2 diabetes mellitus with diabetic chronic kidney disease: Secondary | ICD-10-CM | POA: Insufficient documentation

## 2024-01-31 DIAGNOSIS — Z8616 Personal history of COVID-19: Secondary | ICD-10-CM | POA: Diagnosis not present

## 2024-01-31 DIAGNOSIS — Z8249 Family history of ischemic heart disease and other diseases of the circulatory system: Secondary | ICD-10-CM | POA: Diagnosis not present

## 2024-01-31 DIAGNOSIS — Z8673 Personal history of transient ischemic attack (TIA), and cerebral infarction without residual deficits: Secondary | ICD-10-CM | POA: Diagnosis not present

## 2024-01-31 DIAGNOSIS — Z79899 Other long term (current) drug therapy: Secondary | ICD-10-CM | POA: Insufficient documentation

## 2024-01-31 DIAGNOSIS — I428 Other cardiomyopathies: Secondary | ICD-10-CM | POA: Insufficient documentation

## 2024-01-31 DIAGNOSIS — Z8619 Personal history of other infectious and parasitic diseases: Secondary | ICD-10-CM | POA: Insufficient documentation

## 2024-01-31 DIAGNOSIS — R001 Bradycardia, unspecified: Secondary | ICD-10-CM | POA: Diagnosis not present

## 2024-01-31 DIAGNOSIS — I5032 Chronic diastolic (congestive) heart failure: Secondary | ICD-10-CM | POA: Insufficient documentation

## 2024-01-31 DIAGNOSIS — I130 Hypertensive heart and chronic kidney disease with heart failure and stage 1 through stage 4 chronic kidney disease, or unspecified chronic kidney disease: Secondary | ICD-10-CM | POA: Insufficient documentation

## 2024-01-31 DIAGNOSIS — E119 Type 2 diabetes mellitus without complications: Secondary | ICD-10-CM | POA: Diagnosis not present

## 2024-01-31 DIAGNOSIS — R188 Other ascites: Secondary | ICD-10-CM

## 2024-01-31 DIAGNOSIS — J449 Chronic obstructive pulmonary disease, unspecified: Secondary | ICD-10-CM | POA: Insufficient documentation

## 2024-01-31 DIAGNOSIS — N189 Chronic kidney disease, unspecified: Secondary | ICD-10-CM | POA: Diagnosis not present

## 2024-01-31 DIAGNOSIS — I10 Essential (primary) hypertension: Secondary | ICD-10-CM

## 2024-01-31 DIAGNOSIS — K7460 Unspecified cirrhosis of liver: Secondary | ICD-10-CM | POA: Insufficient documentation

## 2024-01-31 DIAGNOSIS — M4802 Spinal stenosis, cervical region: Secondary | ICD-10-CM | POA: Insufficient documentation

## 2024-01-31 NOTE — Patient Instructions (Addendum)
 Medication Changes:  No medication changes.   Lab Work:  Go DOWN to LOWER LEVEL (LL) to have your blood work completed inside of [employer] office.  We will only call you if the results are abnormal or if the provider would like to make medication changes.   Referrals:  We have placed a referral today to General Cardiology for your low heart rate. They should reach out to you within 1-2 weeks. If they do not, please reach out to them. Their information is below on this AVS. They are also located downstairs on the Lower Level in the [facility] building.   Follow-Up in: 2 months with Shawnee Dellen, FNP.  At the [facility], you and your health needs are our priority. We have a designated team specialized in the treatment of Heart Failure. This Care Team includes your primary Heart Failure Specialized Cardiologist (physician), Advanced Practice Providers (APPs- Physician Assistants and Nurse Practitioners), and Pharmacist who all work together to provide you with the care you need, when you need it.   You may see any of the following providers on your designated Care Team at your next follow up:  Dr. Jules Oar Dr. Peder Bourdon Dr. Alwin Baars Dr. Judyth Nunnery Shawnee Dellen, FNP Bevely Brush, RPH-CPP  Please be sure to bring in all your medications bottles to every appointment.   Need to Contact Us :  If you have any questions or concerns before your next appointment please send us  a message through [place] or call our office at [phone].    TO LEAVE A MESSAGE FOR THE NURSE SELECT OPTION 2, PLEASE LEAVE A MESSAGE INCLUDING: YOUR NAME DATE OF BIRTH CALL BACK NUMBER REASON FOR CALL**this is important as we prioritize the call backs  YOU WILL RECEIVE A CALL BACK THE SAME DAY AS LONG AS YOU CALL BEFORE 4:00 PM

## 2024-01-31 NOTE — Progress Notes (Signed)
 Advanced Heart Failure Clinic Note    PCP: [facility]  Primary Cardiologist: Belva Boyden, MD (last seen 02/22)  Chief Complaint: shortness of breath   HPI:  [name] is a 73 y/o male with a history of T2DM, HTN, COPD, migraines, hepatitis C, w/ likely underlying HCC, cirrhosis, TIA, depression, anxiety obstructive sleep apnea, cervical stenosis status post C3-6 ACDF, hyperlipidemia, CKD, chronic foley and chronic heart failure.  Echo 04/01/21: EF of 55-60% along with mild LVH/ LAE. Echo 03/25/22: EF 60-65% Echo 12/24/22: EF 60-65% along with mild LVH, mild LAE.    Admitted 06/17/23 due to dysuria, was found to have E. coli bacteremia. Having some falls, patient having abdominal pain and weight loss. Abd/ pelvic CT stable. MRI of cervical spine shows C4-5 severe spinal stenosis with cord compression. Neurosurgery consulted. Antibiotics provided.  Admitted 07/01/23 due to cervical myelopathy and cervical stenosis status post C3-6 ACDF. He was admitted overnight for pain control, therapy evaluation, and drain output monitoring. His postoperative course was complicated by worsening thrombocytopenia which improved back to his baseline with 1 unit of platelets. His drain output reduced to an acceptable level and was removed on the afternoon of postop day 1 and discharged the following day.   Admitted 07/29/23 due to AMS and a fall. Had not been taking his lactulose  due to diarrhea & generalized weakness. CXR normal. Head CT normal. Palliative care consulted due to multiple comorbidities. PT consulted. Mental status improved and discharged to group home. 2 December ED visits due to weakness/ tremors.   Admitted 10/21/23 with acute onset of AMS thought to be related to hepatic encephalopathy. Ammonia 185 -> 25 with improvement of mental status. Daily oral protein recommended. Lactulose  to be titrated for 2-3 soft stools daily. Losartan  & spironolactone  resumed. EKG while admitted  showed junctional bradycardia.  Since last seen in [facility] 03/25, he has been in the ED twice and admitted 6 times. Most recent admission was 01/11/24 with AMS due to hepatic encephalopathy after not having BM for 2 days. Ammonia level was 82=>90. Provider noted extensive education to patient and group home caregiver about titrating lactulose  to obtain 2-3 bowel movements daily.     He presents today for a HF follow-up visit with a chief complaint of minimal shortness of breath. Has associated fatigue, abdominal pain, pedal edema, rare chest pain. Denies chest pain, palpitations, abdominal distention, dizziness or difficulty sleeping. Currently has a foley catheter in place. Taking lactulose  3 times daily with several BM's daily.   ROS: All systems negative except as listed in HPI, PMH and Problem List.  SH:  Social History   Socioeconomic History   Marital status: Single    Spouse name: Not on file   Number of children: 2   Years of education: 12   Highest education level: 12th grade  Occupational History   Occupation: retired  Tobacco Use   Smoking status: Every Day    Types: Cigarettes   Smokeless tobacco: Never  Vaping Use   Vaping status: Not on file  Substance and Sexual Activity   Alcohol use: Not Currently   Drug use: Not Currently   Sexual activity: Not Currently    Birth control/protection: None  Other Topics Concern   Not on file  Social History Narrative   ** Merged History Encounter **       Lives in [facility]   Social Drivers of Health   Financial Resource Strain: High Risk (04/27/2022)   Overall Financial  Resource Strain (CARDIA)    Difficulty of Paying Living Expenses: Very hard  Food Insecurity: No Food Insecurity (01/12/2024)   Hunger Vital Sign    Worried About Running Out of Food in the Last Year: Never true    Ran Out of Food in the Last Year: Never true  Transportation Needs: No Transportation Needs (01/12/2024)   PRAPARE - Therapist, art (Medical): No    Lack of Transportation (Non-Medical): No  Physical Activity: Sufficiently Active (04/16/2018)   Exercise Vital Sign    Days of Exercise per Week: 7 days    Minutes of Exercise per Session: 30 min  Stress: Stress Concern Present (04/16/2018)   [employer] of Occupational Health - Occupational Stress Questionnaire    Feeling of Stress : Very much  Social Connections: Socially Isolated (01/12/2024)   Social Connection and Isolation Panel [NHANES]    Frequency of Communication with Friends and Family: Once a week    Frequency of Social Gatherings with Friends and Family: Twice a week    Attends Religious Services: Never    Database administrator or Organizations: No    Attends Banker Meetings: Never    Marital Status: Never married  Intimate Partner Violence: Not At Risk (01/12/2024)   [name], Afraid, [name], and Kick questionnaire    Fear of Current or Ex-Partner: No    Emotionally Abused: No    Physically Abused: No    Sexually Abused: No    FH:  Family History  Problem Relation Age of Onset   Hypertension Mother    Heart disease Mother    Heart failure Maternal Grandmother     Past Medical History:  Diagnosis Date   (HFpEF) heart failure with preserved ejection fraction (HCC)    a.) TTE 03/20/2018: EF 60-65%, no RWMAs, mild LAE, mild-mod Drew, PASP 42; b.) TTE 03/31/2021: EF 55-60%, no RWMAs, mild LVH, mild LAE, AoV sclerosis without stenosis; c.) TTE 03/25/2022: EF 60-65%, no RWMAs, AoV sclerosis without stenosis; d.) TTE 12/24/2022: EF 60-65%, no RWMAs, mild LVH, mild LAE.   Anasarca    Anemia    Anxiety    Aortic atherosclerosis (HCC)    Arthritis    Asthma    Bacteremia due to Escherichia coli 05/2023   Bacteremia due to Klebsiella pneumoniae 2023   Bilateral carotid artery disease (HCC)    Bradycardia    CAD (coronary artery disease) 10/21/2006   a.) MV 10/21/2006: small reversible defect involving the apical   lateral wall c/w possible ischemia; b.) MV: 03/24/2018: no ischemia; c.) MV 10/04/2021: LAD calcifications   CAD (coronary artery disease)    Cervical radiculopathy    Chest pain    a.) MV 03/21/2018: no ischemia; b.) MV 10/04/2021: no ischemia   Chronic back pain    Chronic common bile duct dilatation    Cirrhosis (HCC)    a.) (+) hepatic lesion on CT; likely hepatocellular carcinoma; b.) CT evidence of associated portal hypertension; c.) (+) abdominopelvic anasarca   COPD (chronic obstructive pulmonary disease) (HCC)    DDD (degenerative disc disease), lumbar    Depression    Frequent falls    GERD (gastroesophageal reflux disease)    HBV (hepatitis B virus) infection    HCV (hepatitis C virus)    Hernia of anterior abdominal wall    History of 2019 novel coronavirus disease (COVID-19) 04/16/2020   Homelessness    a.) limited on placement as patient is listed on [place]  sex offender listing for exploitation of a minor (possession of child pornography); incarcerated 12 years (released 11/2017)   Hyperlipidemia    Hypertension    Infestation by bed bug 02/2022   Malingering    Migraine    Nephrolithiasis    Obesity    Portal hypertension (HCC)    PTSD (post-traumatic stress disorder)    a.) brief training in the [employer] at age 62; someone was killed in front of him   Pulmonary HTN (HCC)    a.) multiple CT scans desmontrating dilated PA c/w pHTN; b.) TTE 03/20/2018: PASP 42 mmHg   Sleep apnea    a.) no nocturnal PAP therapy   Sleep apnea    Stenosis of cervical spine with myelopathy (HCC)    Suicidal ideations    T2DM (type 2 diabetes mellitus) (HCC)    Thrombocytopenia (HCC)    a.) related to hepatic cirrhosis; spleen normal in size   TIA (transient ischemic attack)    TIA (transient ischemic attack) 09/30/2023    Current Outpatient Medications  Medication Sig Dispense Refill   acetaminophen  (TYLENOL ) 650 MG CR tablet Take 650 mg by mouth every 8 (eight) hours as needed for  pain.     albuterol  (PROVENTIL ) (2.5 MG/3ML) 0.083% nebulizer solution Take 2.5 mg by nebulization every 4 (four) hours as needed for wheezing or shortness of breath.     aspirin  81 MG chewable tablet Chew 81 mg by mouth daily.     atorvastatin  (LIPITOR) 40 MG tablet Take 40 mg by mouth daily.     citalopram  (CELEXA ) 20 MG tablet Take 20 mg by mouth daily.     feeding supplement (ENSURE ENLIVE / ENSURE PLUS) LIQD Take 237 mLs by mouth 3 (three) times daily between meals. 237 mL 12   furosemide  (LASIX ) 20 MG tablet Take 1 tablet (20 mg total) by mouth daily. 30 tablet 0   gabapentin  (NEURONTIN ) 300 MG capsule Take 300 mg by mouth 2 (two) times daily.     lactulose  (CHRONULAC ) 10 GM/15ML solution Take 67.5 mLs (45 g total) by mouth 3 (three) times daily. Please titrate to have 2-3 soft bowel movements daily 946 mL 10   Multiple Vitamin (MULTIVITAMIN WITH MINERALS) TABS tablet Take 1 tablet by mouth daily.     nitroGLYCERIN  (NITROLINGUAL ) 0.4 MG/SPRAY spray Place 1 spray under the tongue every 5 (five) minutes x 3 doses as needed for chest pain. 12 g 5   ondansetron  (ZOFRAN ) 4 MG tablet Take 1 tablet (4 mg total) by mouth every 6 (six) hours as needed for nausea. 20 tablet 0   pantoprazole  (PROTONIX ) 40 MG tablet Take 40 mg by mouth daily.     phenol (CHLORASEPTIC) 1.4 % LIQD Use as directed 1 spray in the mouth or throat as needed for throat irritation / pain.     QUEtiapine  (SEROQUEL ) 50 MG tablet Take 50 mg by mouth at bedtime.     senna (SENOKOT) 8.6 MG TABS tablet Take 1 tablet (8.6 mg total) by mouth 2 (two) times daily as needed for mild constipation. 60 tablet 0   spironolactone  (ALDACTONE ) 25 MG tablet Take 1 tablet (25 mg total) by mouth daily. 30 tablet 1   VENTOLIN  HFA 108 (90 Base) MCG/ACT inhaler Inhale 2 puffs into the lungs every 4 (four) hours as needed for wheezing.     XIFAXAN  550 MG TABS tablet Take 550 mg by mouth 2 (two) times daily.     No current facility-administered  medications for this  visit.   Vitals:   01/31/24 1529  BP: 121/68  Pulse: (!) 46  SpO2: 100%  Weight: 218 lb (98.9 kg)   Wt Readings from Last 3 Encounters:  01/31/24 218 lb (98.9 kg)  01/11/24 209 lb 14.1 oz (95.2 kg)  01/03/24 211 lb 3.2 oz (95.8 kg)   Lab Results  Component Value Date   CREATININE 1.30 (H) 01/11/2024   CREATININE 1.39 (H) 01/02/2024   CREATININE 1.35 (H) 01/01/2024    PHYSICAL EXAM:  General: Skin jaundiced. No resp difficulty HEENT: normal Neck: supple, no JVD Cor: Regular rhythm, bradycardic. No rubs, gallops or murmurs Lungs: clear Abdomen: soft, nontender, nondistended. Extremities: no cyanosis, clubbing, rash, 2+ pitting edema bilateral lower legs Neuro: alert & oriented X 3. Moves all 4 extremities w/o difficulty. Affect pleasant   ECG: 01/11/24 was SB   ASSESSMENT & PLAN:  1: NICM with preserved ejection fraction- - likely due to HTN/ liver disease - NYHA class II - euvolemic - being weighed daily at the group home; reminded to call for overnight weight gain of > 3 pounds or weekly weight gain of > 5 pounds - Echo 03/20/18: EF of 60-65% along with mild/moderate Drew and a mildly elevated PA pressure of 42 mm Hg - Echo 04/01/21: EF of 55-60% along with mild LVH/ LAE. - Echo 03/25/22: EF 60-65% - Echo 12/24/22: EF 60-65% along with mild LVH, mild LAE.   - continue furosemide  20mg  daily - continue spironolactone  25mg  daily; may need to increase this pending lab results from today - had telemedicine visit with cardiology Jerelene Monday) 02/22 - BNP 10/21/23 reviewed and was 250.6  2: HTN- - BP 121/68 - sees PCP at [facility])  - BMP 01/11/24 reviewed: sodium 140, potassium 3.1, creatinine 1.3 and GFR 58 - BMP today  3: DM- - A1c 09/30/23 reviewed and was 4.9%  4: Liver cirrhosis- - likely secondary hepatitis C w/ positive viral load in 2019 - acute viral panel 02/24 demonstrated positive for hepatitis B antigen.  -  emphasized lactulose  be given so that he has 2-3 soft stools daily  5: Sinus bradycardia- - not on any nodal control agents - referral made to [facility] to get re-established (last seen 02/22)   Return in 2 months, sooner if needed.   Charlette Console, FNP 01/31/24

## 2024-02-01 LAB — BASIC METABOLIC PANEL WITH GFR
BUN/Creatinine Ratio: 12 (ref 10–24)
BUN: 15 mg/dL (ref 8–27)
CO2: 21 mmol/L (ref 20–29)
Calcium: 8.8 mg/dL (ref 8.6–10.2)
Chloride: 115 mmol/L — ABNORMAL HIGH (ref 96–106)
Creatinine, Ser: 1.21 mg/dL (ref 0.76–1.27)
Glucose: 101 mg/dL — ABNORMAL HIGH (ref 70–99)
Potassium: 4.1 mmol/L (ref 3.5–5.2)
Sodium: 144 mmol/L (ref 134–144)
eGFR: 63 mL/min/{1.73_m2} (ref >59)

## 2024-02-02 ENCOUNTER — Ambulatory Visit: Payer: Self-pay | Admitting: Family

## 2024-02-02 DIAGNOSIS — I5032 Chronic diastolic (congestive) heart failure: Secondary | ICD-10-CM

## 2024-02-03 MED ORDER — FUROSEMIDE 20 MG PO TABS: 40.0000 mg | ORAL_TABLET | Freq: Every day | ORAL | 0 refills | Status: DC

## 2024-02-03 NOTE — Telephone Encounter (Signed)
 Spoke to pt caregiver. Caregiver verbalized understanding of plan. Caregiver asked that we fax order for blood work and meds to [phone] even though [facility].

## 2024-02-03 NOTE — Telephone Encounter (Signed)
-----   Message from Charlette Console sent at 02/02/2024  6:25 PM EDT ----- Increase furosemide  to 40mg  daily. Recheck BMET 1 month.

## 2024-02-17 ENCOUNTER — Inpatient Hospital Stay
Admission: EM | Admit: 2024-02-17 | Discharge: 2024-02-22 | DRG: 698 | Disposition: A | Discharge status: Home Health | Attending: Family Medicine | Admitting: Family Medicine

## 2024-02-17 ENCOUNTER — Emergency Department

## 2024-02-17 ENCOUNTER — Other Ambulatory Visit: Payer: Self-pay

## 2024-02-17 DIAGNOSIS — I2510 Atherosclerotic heart disease of native coronary artery without angina pectoris: Secondary | ICD-10-CM | POA: Diagnosis present

## 2024-02-17 DIAGNOSIS — E7800 Pure hypercholesterolemia, unspecified: Secondary | ICD-10-CM | POA: Diagnosis present

## 2024-02-17 DIAGNOSIS — Z8249 Family history of ischemic heart disease and other diseases of the circulatory system: Secondary | ICD-10-CM

## 2024-02-17 DIAGNOSIS — Z9049 Acquired absence of other specified parts of digestive tract: Secondary | ICD-10-CM

## 2024-02-17 DIAGNOSIS — M51369 Other intervertebral disc degeneration, lumbar region without mention of lumbar back pain or lower extremity pain: Secondary | ICD-10-CM | POA: Diagnosis present

## 2024-02-17 DIAGNOSIS — Z8616 Personal history of COVID-19: Secondary | ICD-10-CM

## 2024-02-17 DIAGNOSIS — I951 Orthostatic hypotension: Secondary | ICD-10-CM

## 2024-02-17 DIAGNOSIS — G8929 Other chronic pain: Secondary | ICD-10-CM | POA: Diagnosis present

## 2024-02-17 DIAGNOSIS — M4802 Spinal stenosis, cervical region: Secondary | ICD-10-CM | POA: Diagnosis present

## 2024-02-17 DIAGNOSIS — Z7984 Long term (current) use of oral hypoglycemic drugs: Secondary | ICD-10-CM

## 2024-02-17 DIAGNOSIS — Z8673 Personal history of transient ischemic attack (TIA), and cerebral infarction without residual deficits: Secondary | ICD-10-CM

## 2024-02-17 DIAGNOSIS — F419 Anxiety disorder, unspecified: Secondary | ICD-10-CM | POA: Diagnosis present

## 2024-02-17 DIAGNOSIS — N390 Urinary tract infection, site not specified: Secondary | ICD-10-CM | POA: Diagnosis not present

## 2024-02-17 DIAGNOSIS — R296 Repeated falls: Secondary | ICD-10-CM | POA: Diagnosis present

## 2024-02-17 DIAGNOSIS — E7220 Disorder of urea cycle metabolism, unspecified: Secondary | ICD-10-CM | POA: Diagnosis present

## 2024-02-17 DIAGNOSIS — K766 Portal hypertension: Secondary | ICD-10-CM | POA: Diagnosis present

## 2024-02-17 DIAGNOSIS — K7460 Unspecified cirrhosis of liver: Secondary | ICD-10-CM | POA: Diagnosis present

## 2024-02-17 DIAGNOSIS — Z8619 Personal history of other infectious and parasitic diseases: Secondary | ICD-10-CM

## 2024-02-17 DIAGNOSIS — Z6839 Body mass index (BMI) 39.0-39.9, adult: Secondary | ICD-10-CM

## 2024-02-17 DIAGNOSIS — R6521 Severe sepsis with septic shock: Secondary | ICD-10-CM | POA: Diagnosis present

## 2024-02-17 DIAGNOSIS — R339 Retention of urine, unspecified: Secondary | ICD-10-CM | POA: Diagnosis present

## 2024-02-17 DIAGNOSIS — E1129 Type 2 diabetes mellitus with other diabetic kidney complication: Secondary | ICD-10-CM | POA: Diagnosis present

## 2024-02-17 DIAGNOSIS — Z8505 Personal history of malignant neoplasm of liver: Secondary | ICD-10-CM

## 2024-02-17 DIAGNOSIS — Z981 Arthrodesis status: Secondary | ICD-10-CM

## 2024-02-17 DIAGNOSIS — N179 Acute kidney failure, unspecified: Secondary | ICD-10-CM | POA: Diagnosis present

## 2024-02-17 DIAGNOSIS — E43 Unspecified severe protein-calorie malnutrition: Secondary | ICD-10-CM | POA: Diagnosis present

## 2024-02-17 DIAGNOSIS — G43909 Migraine, unspecified, not intractable, without status migrainosus: Secondary | ICD-10-CM | POA: Diagnosis present

## 2024-02-17 DIAGNOSIS — G992 Myelopathy in diseases classified elsewhere: Secondary | ICD-10-CM | POA: Diagnosis present

## 2024-02-17 DIAGNOSIS — Z87442 Personal history of urinary calculi: Secondary | ICD-10-CM

## 2024-02-17 DIAGNOSIS — Z91030 Bee allergy status: Secondary | ICD-10-CM

## 2024-02-17 DIAGNOSIS — T83511A Infection and inflammatory reaction due to indwelling urethral catheter, initial encounter: Secondary | ICD-10-CM | POA: Diagnosis not present

## 2024-02-17 DIAGNOSIS — A419 Sepsis, unspecified organism: Secondary | ICD-10-CM | POA: Diagnosis present

## 2024-02-17 DIAGNOSIS — Y846 Urinary catheterization as the cause of abnormal reaction of the patient, or of later complication, without mention of misadventure at the time of the procedure: Secondary | ICD-10-CM | POA: Diagnosis present

## 2024-02-17 DIAGNOSIS — Z9889 Other specified postprocedural states: Secondary | ICD-10-CM

## 2024-02-17 DIAGNOSIS — E66812 Obesity, class 2: Secondary | ICD-10-CM | POA: Diagnosis present

## 2024-02-17 DIAGNOSIS — I130 Hypertensive heart and chronic kidney disease with heart failure and stage 1 through stage 4 chronic kidney disease, or unspecified chronic kidney disease: Secondary | ICD-10-CM | POA: Diagnosis present

## 2024-02-17 DIAGNOSIS — N5089 Other specified disorders of the male genital organs: Secondary | ICD-10-CM | POA: Diagnosis present

## 2024-02-17 DIAGNOSIS — F17210 Nicotine dependence, cigarettes, uncomplicated: Secondary | ICD-10-CM | POA: Diagnosis present

## 2024-02-17 DIAGNOSIS — Z885 Allergy status to narcotic agent status: Secondary | ICD-10-CM

## 2024-02-17 DIAGNOSIS — D649 Anemia, unspecified: Secondary | ICD-10-CM | POA: Diagnosis present

## 2024-02-17 DIAGNOSIS — R7881 Bacteremia: Secondary | ICD-10-CM | POA: Diagnosis present

## 2024-02-17 DIAGNOSIS — J449 Chronic obstructive pulmonary disease, unspecified: Secondary | ICD-10-CM | POA: Diagnosis present

## 2024-02-17 DIAGNOSIS — R6510 Systemic inflammatory response syndrome (SIRS) of non-infectious origin without acute organ dysfunction: Secondary | ICD-10-CM | POA: Insufficient documentation

## 2024-02-17 DIAGNOSIS — E1169 Type 2 diabetes mellitus with other specified complication: Secondary | ICD-10-CM | POA: Diagnosis present

## 2024-02-17 DIAGNOSIS — Z7982 Long term (current) use of aspirin: Secondary | ICD-10-CM

## 2024-02-17 DIAGNOSIS — D696 Thrombocytopenia, unspecified: Secondary | ICD-10-CM | POA: Diagnosis present

## 2024-02-17 DIAGNOSIS — M1990 Unspecified osteoarthritis, unspecified site: Secondary | ICD-10-CM | POA: Diagnosis present

## 2024-02-17 DIAGNOSIS — M5412 Radiculopathy, cervical region: Secondary | ICD-10-CM | POA: Diagnosis present

## 2024-02-17 DIAGNOSIS — I10 Essential (primary) hypertension: Secondary | ICD-10-CM | POA: Diagnosis present

## 2024-02-17 DIAGNOSIS — I2585 Chronic coronary microvascular dysfunction: Secondary | ICD-10-CM

## 2024-02-17 DIAGNOSIS — Z79899 Other long term (current) drug therapy: Secondary | ICD-10-CM

## 2024-02-17 DIAGNOSIS — E1122 Type 2 diabetes mellitus with diabetic chronic kidney disease: Secondary | ICD-10-CM | POA: Diagnosis present

## 2024-02-17 DIAGNOSIS — Z9151 Personal history of suicidal behavior: Secondary | ICD-10-CM

## 2024-02-17 DIAGNOSIS — E872 Acidosis, unspecified: Secondary | ICD-10-CM | POA: Diagnosis present

## 2024-02-17 DIAGNOSIS — G4730 Sleep apnea, unspecified: Secondary | ICD-10-CM | POA: Diagnosis present

## 2024-02-17 DIAGNOSIS — I700 Atherosclerosis of aorta: Secondary | ICD-10-CM | POA: Diagnosis present

## 2024-02-17 DIAGNOSIS — Z87892 Personal history of anaphylaxis: Secondary | ICD-10-CM

## 2024-02-17 DIAGNOSIS — Z884 Allergy status to anesthetic agent status: Secondary | ICD-10-CM

## 2024-02-17 DIAGNOSIS — I5032 Chronic diastolic (congestive) heart failure: Secondary | ICD-10-CM | POA: Diagnosis present

## 2024-02-17 DIAGNOSIS — J4489 Other specified chronic obstructive pulmonary disease: Secondary | ICD-10-CM | POA: Diagnosis present

## 2024-02-17 DIAGNOSIS — F332 Major depressive disorder, 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 psychotic features: Secondary | ICD-10-CM | POA: Diagnosis present

## 2024-02-17 DIAGNOSIS — A4159 Other Gram-negative sepsis: Secondary | ICD-10-CM | POA: Diagnosis present

## 2024-02-17 DIAGNOSIS — N1831 Chronic kidney disease, stage 3a: Secondary | ICD-10-CM | POA: Diagnosis present

## 2024-02-17 DIAGNOSIS — F4310 Post-traumatic stress disorder, unspecified: Secondary | ICD-10-CM | POA: Diagnosis present

## 2024-02-17 DIAGNOSIS — Z886 Allergy status to analgesic agent status: Secondary | ICD-10-CM

## 2024-02-17 DIAGNOSIS — G9341 Metabolic encephalopathy: Secondary | ICD-10-CM

## 2024-02-17 DIAGNOSIS — W06XXXA Fall from bed, initial encounter: Secondary | ICD-10-CM | POA: Diagnosis present

## 2024-02-17 DIAGNOSIS — Z66 Do not resuscitate: Secondary | ICD-10-CM | POA: Diagnosis present

## 2024-02-17 DIAGNOSIS — Z604 Social exclusion and rejection: Secondary | ICD-10-CM | POA: Diagnosis present

## 2024-02-17 DIAGNOSIS — Z882 Allergy status to sulfonamides status: Secondary | ICD-10-CM

## 2024-02-17 DIAGNOSIS — K7682 Hepatic encephalopathy: Secondary | ICD-10-CM

## 2024-02-17 DIAGNOSIS — R531 Weakness: Secondary | ICD-10-CM

## 2024-02-17 DIAGNOSIS — I272 Pulmonary hypertension, unspecified: Secondary | ICD-10-CM | POA: Diagnosis present

## 2024-02-17 DIAGNOSIS — R17 Unspecified jaundice: Secondary | ICD-10-CM | POA: Diagnosis present

## 2024-02-17 DIAGNOSIS — Y738 Miscellaneous gastroenterology and urology devices associated with adverse incidents, not elsewhere classified: Secondary | ICD-10-CM | POA: Diagnosis present

## 2024-02-17 DIAGNOSIS — K219 Gastro-esophageal reflux disease without esophagitis: Secondary | ICD-10-CM | POA: Diagnosis present

## 2024-02-17 DIAGNOSIS — F418 Other specified anxiety disorders: Secondary | ICD-10-CM | POA: Diagnosis present

## 2024-02-17 DIAGNOSIS — R001 Bradycardia, unspecified: Secondary | ICD-10-CM | POA: Diagnosis present

## 2024-02-17 DIAGNOSIS — D72819 Decreased white blood cell count, unspecified: Secondary | ICD-10-CM | POA: Diagnosis present

## 2024-02-17 LAB — PROTIME-INR
INR: 2 — ABNORMAL HIGH (ref 0.8–1.2)
Prothrombin Time: 23.2 s — ABNORMAL HIGH (ref 11.4–15.2)

## 2024-02-17 LAB — CBC WITH DIFFERENTIAL/PLATELET
Abs Immature Granulocytes: 0.04 10*3/uL (ref 0.00–0.07)
Basophils Absolute: 0 10*3/uL (ref 0.0–0.1)
Basophils Relative: 0 %
Eosinophils Absolute: 0 10*3/uL (ref 0.0–0.5)
Eosinophils Relative: 0 %
HCT: 25.1 % — ABNORMAL LOW (ref 39.0–52.0)
Hemoglobin: 8.3 g/dL — ABNORMAL LOW (ref 13.0–17.0)
Immature Granulocytes: 1 %
Lymphocytes Relative: 5 %
Lymphs Abs: 0.4 10*3/uL — ABNORMAL LOW (ref 0.7–4.0)
MCH: 31.1 pg (ref 26.0–34.0)
MCHC: 33.1 g/dL (ref 30.0–36.0)
MCV: 94 fL (ref 80.0–100.0)
Monocytes Absolute: 0.6 10*3/uL (ref 0.1–1.0)
Monocytes Relative: 8 %
Neutro Abs: 6.2 10*3/uL (ref 1.7–7.7)
Neutrophils Relative %: 86 %
Platelets: 57 10*3/uL — ABNORMAL LOW (ref 150–400)
RBC: 2.67 MIL/uL — ABNORMAL LOW (ref 4.22–5.81)
RDW: 15.4 % (ref 11.5–15.5)
Smear Review: NORMAL
WBC: 7.2 10*3/uL (ref 4.0–10.5)
nRBC: 0 % (ref 0.0–0.2)

## 2024-02-17 LAB — COMPREHENSIVE METABOLIC PANEL WITH GFR
ALT: 19 U/L (ref 0–44)
AST: 37 U/L (ref 15–41)
Albumin: 2.4 g/dL — ABNORMAL LOW (ref 3.5–5.0)
Alkaline Phosphatase: 51 U/L (ref 38–126)
Anion gap: 8 (ref 5–15)
BUN: 19 mg/dL (ref 8–23)
CO2: 19 mmol/L — ABNORMAL LOW (ref 22–32)
Calcium: 8.9 mg/dL (ref 8.9–10.3)
Chloride: 110 mmol/L (ref 98–111)
Creatinine, Ser: 1.33 mg/dL — ABNORMAL HIGH (ref 0.61–1.24)
GFR, Estimated: 56 mL/min — ABNORMAL LOW (ref >60)
Glucose, Bld: 79 mg/dL (ref 70–99)
Potassium: 3.7 mmol/L (ref 3.5–5.1)
Sodium: 137 mmol/L (ref 135–145)
Total Bilirubin: 2.5 mg/dL — ABNORMAL HIGH (ref 0.0–1.2)
Total Protein: 5.8 g/dL — ABNORMAL LOW (ref 6.5–8.1)

## 2024-02-17 LAB — URINALYSIS, ROUTINE W REFLEX MICROSCOPIC
Bilirubin Urine: NEGATIVE
Glucose, UA: NEGATIVE mg/dL
Ketones, ur: NEGATIVE mg/dL
Nitrite: NEGATIVE
Protein, ur: >=300 mg/dL — AB
Specific Gravity, Urine: 1.022 (ref 1.005–1.030)
Squamous Epithelial / HPF: 0 /HPF (ref 0–5)
pH: 9 — ABNORMAL HIGH (ref 5.0–8.0)

## 2024-02-17 LAB — LACTIC ACID, PLASMA
Lactic Acid, Venous: 1.9 mmol/L (ref 0.5–1.9)
Lactic Acid, Venous: >9 mmol/L (ref 0.5–1.9)
Lactic Acid, Venous: 2.4 mmol/L (ref 0.5–1.9)

## 2024-02-17 LAB — CK: Total CK: 178 U/L (ref 49–397)

## 2024-02-17 LAB — PROCALCITONIN: Procalcitonin: 0.31 ng/mL

## 2024-02-17 LAB — AMMONIA: Ammonia: 58 umol/L — ABNORMAL HIGH (ref 9–35)

## 2024-02-17 LAB — GLUCOSE, CAPILLARY: Glucose-Capillary: 68 mg/dL — ABNORMAL LOW (ref 70–99)

## 2024-02-17 LAB — MAGNESIUM: Magnesium: 1.3 mg/dL — ABNORMAL LOW (ref 1.7–2.4)

## 2024-02-17 MED ORDER — HEPARIN SODIUM (PORCINE) 5000 UNIT/ML IJ SOLN
5000.0000 [IU] | Freq: Three times a day (TID) | INTRAMUSCULAR | Status: DC
  Administered 2024-02-17: 5000 [IU] via SUBCUTANEOUS
  Filled 2024-02-17: qty 1

## 2024-02-17 MED ORDER — PANTOPRAZOLE SODIUM 40 MG PO TBEC
40.0000 mg | DELAYED_RELEASE_TABLET | Freq: Every day | ORAL | Status: DC
  Administered 2024-02-18 – 2024-02-22 (×5): 40 mg via ORAL
  Filled 2024-02-17 (×5): qty 1

## 2024-02-17 MED ORDER — LACTULOSE 10 GM/15ML PO SOLN
45.0000 g | Freq: Three times a day (TID) | ORAL | Status: DC
  Filled 2024-02-17: qty 90

## 2024-02-17 MED ORDER — LACTATED RINGERS IV BOLUS
1000.0000 mL | Freq: Once | INTRAVENOUS | Status: AC
  Administered 2024-02-17: 1000 mL via INTRAVENOUS

## 2024-02-17 MED ORDER — SODIUM CHLORIDE 0.9 % IV SOLN
2.0000 g | INTRAVENOUS | Status: DC
  Filled 2024-02-17: qty 20

## 2024-02-17 MED ORDER — IPRATROPIUM-ALBUTEROL 0.5-2.5 (3) MG/3ML IN SOLN
3.0000 mL | Freq: Four times a day (QID) | RESPIRATORY_TRACT | Status: AC | PRN
  Administered 2024-02-18 – 2024-02-19 (×3): 3 mL via RESPIRATORY_TRACT
  Filled 2024-02-17 (×3): qty 3

## 2024-02-17 MED ORDER — ACETAMINOPHEN 650 MG RE SUPP: 650.0000 mg | Freq: Four times a day (QID) | RECTAL | Status: DC | PRN

## 2024-02-17 MED ORDER — ACETAMINOPHEN 325 MG PO TABS
650.0000 mg | ORAL_TABLET | Freq: Four times a day (QID) | ORAL | Status: DC | PRN
  Administered 2024-02-18 – 2024-02-19 (×6): 650 mg via ORAL
  Filled 2024-02-17 (×5): qty 2

## 2024-02-17 MED ORDER — SODIUM CHLORIDE 0.9 % IV SOLN
2.0000 g | Freq: Once | INTRAVENOUS | Status: AC
  Administered 2024-02-17: 2 g via INTRAVENOUS
  Filled 2024-02-17: qty 20

## 2024-02-17 MED ORDER — FUROSEMIDE 10 MG/ML IJ SOLN
40.0000 mg | Freq: Once | INTRAMUSCULAR | Status: AC
  Administered 2024-02-17: 40 mg via INTRAVENOUS
  Filled 2024-02-17: qty 4

## 2024-02-17 MED ORDER — INSULIN ASPART 100 UNIT/ML IJ SOLN: 0.0000 [IU] | Freq: Every day | INTRAMUSCULAR | Status: DC

## 2024-02-17 MED ORDER — ATORVASTATIN CALCIUM 20 MG PO TABS
40.0000 mg | ORAL_TABLET | Freq: Every day | ORAL | Status: DC
  Administered 2024-02-17 – 2024-02-22 (×5): 40 mg via ORAL
  Filled 2024-02-17 (×5): qty 2

## 2024-02-17 MED ORDER — SENNOSIDES-DOCUSATE SODIUM 8.6-50 MG PO TABS: 1.0000 | ORAL_TABLET | Freq: Every evening | ORAL | Status: DC | PRN

## 2024-02-17 MED ORDER — LACTULOSE 10 GM/15ML PO SOLN
20.0000 g | Freq: Three times a day (TID) | ORAL | Status: DC
  Administered 2024-02-18 – 2024-02-22 (×13): 20 g via ORAL
  Filled 2024-02-17 (×12): qty 30

## 2024-02-17 MED ORDER — QUETIAPINE FUMARATE 25 MG PO TABS
50.0000 mg | ORAL_TABLET | Freq: Every day | ORAL | Status: DC
  Administered 2024-02-17 – 2024-02-22 (×5): 50 mg via ORAL
  Filled 2024-02-17 (×5): qty 2

## 2024-02-17 MED ORDER — ONDANSETRON HCL 4 MG/2ML IJ SOLN
4.0000 mg | Freq: Four times a day (QID) | INTRAMUSCULAR | Status: DC | PRN
Start: 2024-02-17 — End: 2024-02-22

## 2024-02-17 MED ORDER — NITROGLYCERIN 0.4 MG/SPRAY TL SOLN: 1.0000 | Status: DC | PRN

## 2024-02-17 MED ORDER — SPIRONOLACTONE 25 MG PO TABS
25.0000 mg | ORAL_TABLET | Freq: Every day | ORAL | Status: DC
  Administered 2024-02-18 – 2024-02-19 (×2): 25 mg via ORAL
  Filled 2024-02-17 (×2): qty 1

## 2024-02-17 MED ORDER — FUROSEMIDE 40 MG PO TABS
40.0000 mg | ORAL_TABLET | Freq: Every day | ORAL | Status: DC
  Administered 2024-02-18 – 2024-02-19 (×2): 40 mg via ORAL
  Filled 2024-02-17 (×2): qty 1

## 2024-02-17 MED ORDER — ASPIRIN 81 MG PO CHEW
81.0000 mg | CHEWABLE_TABLET | Freq: Every day | ORAL | Status: DC
  Administered 2024-02-18 – 2024-02-22 (×5): 81 mg via ORAL
  Filled 2024-02-17 (×5): qty 1

## 2024-02-17 MED ORDER — ONDANSETRON HCL 4 MG PO TABS: 4.0000 mg | ORAL_TABLET | Freq: Four times a day (QID) | ORAL | Status: DC | PRN

## 2024-02-17 MED ORDER — RIFAXIMIN 550 MG PO TABS
550.0000 mg | ORAL_TABLET | Freq: Two times a day (BID) | ORAL | Status: DC
  Administered 2024-02-17 – 2024-02-22 (×10): 550 mg via ORAL
  Filled 2024-02-17 (×11): qty 1

## 2024-02-17 MED ORDER — INSULIN ASPART 100 UNIT/ML IJ SOLN: 0.0000 [IU] | Freq: Three times a day (TID) | INTRAMUSCULAR | Status: DC

## 2024-02-17 MED ORDER — HYDRALAZINE HCL 20 MG/ML IJ SOLN: 5.0000 mg | Freq: Four times a day (QID) | INTRAMUSCULAR | Status: DC | PRN

## 2024-02-17 NOTE — Assessment & Plan Note (Signed)
 -  Dietitian has been consulted ?

## 2024-02-17 NOTE — Assessment & Plan Note (Signed)
 Home rifaximin  550 mg p.o. twice daily, spironolactone  25 mg daily, home lactulose  were resumed on admission

## 2024-02-17 NOTE — ED Triage Notes (Addendum)
 BIB EMS from care home, [facility].  Called out earlier today for fall from wheelchair.  Refused transport at that time.  Pt is altered.  Not following commands well.  Stroke screen negative for EMS.  HR 80 RR 30, 92% on RA, 95% on 2L   Temp 103.2 axillary temp.  20G LW.  1000 Tylenol  IV.  500 NS bolus.  CBG 100, 114/64, end tidal 28

## 2024-02-17 NOTE — ED Notes (Signed)
 Foley tubing and bag changed. Waiting for urine.

## 2024-02-17 NOTE — Assessment & Plan Note (Signed)
 Better than patient's baseline level

## 2024-02-17 NOTE — Assessment & Plan Note (Signed)
 Continue ceftriaxone  2 g IV daily to complete 5-day course

## 2024-02-17 NOTE — Assessment & Plan Note (Signed)
Home atorvastatin 40 mg nightly resumed

## 2024-02-17 NOTE — Assessment & Plan Note (Signed)
 With reported fever by EDP, increased heart rate, Blood cultures x 2 are in process UA is positive for large leukocytes Urine culture in process Continue with ceftriaxone  2 g IV daily

## 2024-02-17 NOTE — Assessment & Plan Note (Signed)
 Patient on Lasix  and Aldactone 

## 2024-02-17 NOTE — Assessment & Plan Note (Deleted)
 Check right upper quadrant ultrasound A.m. team to consider paracentesis as appropriate

## 2024-02-17 NOTE — ED Provider Notes (Signed)
 [facility] Provider Note    Event Date/Time   First MD Initiated Contact with Patient 02/17/24 1844     (approximate)   History   Altered Mental Status   HPI  [name] is a 73 y.o. male who presents to the ED for evaluation of Altered Mental Status   I review a medical DC summary from 5/18.  Admitted for hepatic encephalopathy  urinary retention a Foley catheter in place.  Patient reports a home health nurse exchanged his catheter as an outpatient yesterday  Patient presents to the ED for evaluation of fever, confusion and sliding out of bed earlier this afternoon.  He is able to provide all history and reports that he slid out of bed earlier today but had no injuries or syncope or head trauma.  Reports he knew that he had a fever for the past 1 day, chills subjectively.  He reports acute on chronic worsening urethral pain at the site of his Foley catheter without discharge around the catheter   Physical Exam   Triage Vital Signs: ED Triage Vitals  Encounter Vitals Group     BP 02/17/24 1845 (!) 129/96     Girls Systolic BP Percentile --      Girls Diastolic BP Percentile --      Boys Systolic BP Percentile --      Boys Diastolic BP Percentile --      Pulse Rate 02/17/24 1845 70     Resp 02/17/24 1845 (!) 21     Temp --      Temp src --      SpO2 02/17/24 1845 99 %     Weight --      Height --      Head Circumference --      Peak Flow --      Pain Score 02/17/24 1846 0     Pain Loc --      Pain Education --      Exclude from Growth Chart --     Most recent vital signs: Vitals:   02/17/24 1903 02/17/24 1938  BP:    Pulse:    Resp:    Temp: (!) 103.3 F (39.6 C) 98.7 F (37.1 C)  SpO2:      General: Awake, no distress.  CV:  Good peripheral perfusion.  Resp:  Normal effort.  Abd:  No distention.  Mild suprapubic tenderness MSK:  No deformity noted.  Neuro:  No focal deficits appreciated. Other:     ED Results /  Procedures / Treatments   Labs (all labs ordered are listed, but only abnormal results are displayed) Labs Reviewed  COMPREHENSIVE METABOLIC PANEL WITH GFR - Abnormal; Notable for the following components:      Result Value   CO2 19 (*)    Creatinine, Ser 1.33 (*)    Total Protein 5.8 (*)    Albumin  2.4 (*)    Total Bilirubin 2.5 (*)    GFR, Estimated 56 (*)    All other components within normal limits  PROTIME-INR - Abnormal; Notable for the following components:   Prothrombin Time 23.2 (*)    INR 2.0 (*)    All other components within normal limits  URINALYSIS, ROUTINE W REFLEX MICROSCOPIC - Abnormal; Notable for the following components:   Color, Urine AMBER (*)    APPearance CLOUDY (*)    pH 9.0 (*)    Hgb urine dipstick SMALL (*)    Protein, ur >=  300 (*)    Leukocytes,Ua LARGE (*)    Bacteria, UA FEW (*)    All other components within normal limits  AMMONIA - Abnormal; Notable for the following components:   Ammonia 58 (*)    All other components within normal limits  MAGNESIUM  - Abnormal; Notable for the following components:   Magnesium  1.3 (*)    All other components within normal limits  CBC WITH DIFFERENTIAL/PLATELET - Abnormal; Notable for the following components:   RBC 2.67 (*)    Hemoglobin 8.3 (*)    HCT 25.1 (*)    Platelets 57 (*)    Lymphs Abs 0.4 (*)    All other components within normal limits  CULTURE, BLOOD (ROUTINE X 2)  CULTURE, BLOOD (ROUTINE X 2)  URINE CULTURE  LACTIC ACID, PLASMA  CK  PROCALCITONIN  LACTIC ACID, PLASMA  CBC WITH DIFFERENTIAL/PLATELET    EKG Sinus rhythm with a rate of 70 bpm.  QTc 504.  No STEMI.  RADIOLOGY CXR interpreted by me without evidence of acute cardiopulmonary pathology.  Official radiology report(s): DG Chest Port 1 View Result Date: 02/17/2024 CLINICAL DATA:  Questionable sepsis EXAM: PORTABLE CHEST 1 VIEW COMPARISON:  Chest x-ray 01/11/2024 FINDINGS: The heart is mildly enlarged. There is no focal lung  infiltrate, pleural effusion or pneumothorax. Cervical spinal fusion plate is present. The IMPRESSION: Mild cardiomegaly. No acute pulmonary process. Electronically Signed   By: Greig Pique M.D.   On: 02/17/2024 19:59    PROCEDURES and INTERVENTIONS:  .Critical Care  Performed by: Claudene Rover, MD Authorized by: Claudene Rover, MD   Critical care provider statement:    Critical care time (minutes):  30   Critical care time was exclusive of:  Separately billable procedures and treating other patients   Critical care was necessary to treat or prevent imminent or life-threatening deterioration of the following conditions:  Sepsis   Critical care was time spent personally by me on the following activities:  Development of treatment plan with patient or surrogate, discussions with consultants, evaluation of patient's response to treatment, examination of patient, ordering and review of laboratory studies, ordering and review of radiographic studies, ordering and performing treatments and interventions, pulse oximetry, re-evaluation of patient's condition and review of old charts   Medications  cefTRIAXone  (ROCEPHIN ) 2 g in sodium chloride  0.9 % 100 mL IVPB (has no administration in time range)  lactated ringers  bolus 1,000 mL (1,000 mLs Intravenous New Bag/Given 02/17/24 1904)     IMPRESSION / MDM / ASSESSMENT AND PLAN / ED COURSE  I reviewed the triage vital signs and the nursing notes.  Differential diagnosis includes, but is not limited to, encephalopathy, sepsis, bacteremia, metabolic encephalopathy, symptomatic anemia, AKI  {Patient presents with symptoms of an acute illness or injury that is potentially life-threatening.  Patient presents with signs of sepsis from UTI.  He is febrile but stable without signs of shock.  He received fluids and IV antibiotics.  Recently had a catheter placed, we exchanged the external tubing and obtained a UA off of this and that this sent for culture.   Normocytic anemia without bleeding symptoms.  No indications for transfusion.  Renal function around baseline as well as a normal lactic acid.  We will consult with medicine for admission.  Clinical Course as of 02/17/24 2133  Mon Feb 17, 2024  1924 Fever 1 day, fell out of bed this afternoon. No injury [DS]    Clinical Course User Index [DS] Claudene Rover, MD  FINAL CLINICAL IMPRESSION(S) / ED DIAGNOSES   Final diagnoses:  Complicated UTI (urinary tract infection)     Rx / DC Orders   ED Discharge Orders     None        Note:  This document was prepared using Dragon voice recognition software and may include unintentional dictation errors.   Claudene Rover, MD 02/17/24 2133

## 2024-02-17 NOTE — H&P (Signed)
 History and Physical   [name] FMW:[id number] DOB: 12/26/1950 DOA: 02/17/2024  PCP: [facility]  Patient coming from: Home care via EMS  I have personally briefly reviewed patient's old medical records in [facility] EMR.  Chief Concern: Fall  HPI: [name] is a 73 year old male with history of hepatitis C, hypertension, COPD, hyperlipidemia, non-insulin -dependent diabetes type 2, depression, history of TIA, history of multiple admissions for hepatic encephalopathy, presents emergency department for chief concerns of falling out of bed at care home.  Vitals in the ED showed T of 98.7, rr 21, heart rate 70, blood pressure 129/96, SpO2 99% on room air.  Per EDP, there was an initial record of fever of T max of 103.3.  Serum sodium is 137, potassium 3.7, chloride 110, bicarb 19, BUN of 19, serum creatinine 1.33, EGFR 56, nonfasting blood glucose 79, WBC 7.2, hemoglobin 8.3, platelets of 57.  CK level was 178.  Procalcitonin was mildly elevated at 0.31.  UA was positive for large leukocytes.  Ammonia level is 58.  Lactic acid was initially 1.9 and on repeat was greater than 9.0.  ED treatment: Ceftriaxone  2 g IV one-time dose, LR 1 L bolus x 2. --------------------------------- At bedside, patient able to tell me his first and last name, his age, location.  He was not able to tell me the current month or the current calendar year.  He reports he is feeling fine at this time.  He reports that he is compliant with his home medications.  He reports that his leg has been swelling for the last week and he does not know why.  Reports he just slid off the bed and denies head trauma or loss of consciousness today.  Social history: He lives in a home care.  He denies tobacco, EtOH, recreational drug use.  ROS: Constitutional: no weight change, no fever ENT/Mouth: no sore throat, no rhinorrhea Eyes: no eye pain, no vision changes Cardiovascular: no chest pain, no  dyspnea,  + edema, no palpitations Respiratory: no cough, no sputum, no wheezing Gastrointestinal: no nausea, no vomiting, no diarrhea, no constipation Genitourinary: no urinary incontinence, no dysuria, no hematuria Musculoskeletal: no arthralgias, no myalgias Skin: no skin lesions, no pruritus, Neuro: + weakness, no loss of consciousness, no syncope Psych: no anxiety, no depression, no decrease appetite Heme/Lymph: no bruising, no bleeding  ED Course: Discussed with EDP, patient requiring hospitalization for chief concerns of complicated UTI.  Assessment/Plan  Principal Problem:   Complicated UTI (urinary tract infection) Active Problems:   Liver cirrhosis (HCC)   HTN (hypertension)   Thrombocytopenia (HCC)   COPD (chronic obstructive pulmonary disease) (HCC)   Hx of transient ischemic attack (TIA)   Type II diabetes mellitus with renal manifestations (HCC)   Type 2 diabetes mellitus with hyperlipidemia (HCC)   Depression with anxiety   CAD (coronary artery disease)   Pure hypercholesterolemia   Weakness   Protein-calorie malnutrition, severe (HCC)   Major depressive disorder, 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 psychotic features (HCC)   Serum total bilirubin elevated   Hyperammonemia (HCC)   SIRS (systemic inflammatory response syndrome) (HCC)   Assessment and Plan:  * Complicated UTI (urinary tract infection) Continue ceftriaxone  2 g IV daily to complete 5-day course  Liver cirrhosis (HCC) Home rifaximin  550 mg p.o. twice daily, spironolactone  25 mg daily, home lactulose  were resumed on admission  Hx of transient ischemic attack (TIA) Home aspirin  81 mg daily, atorvastatin  40 mg nightly were resumed  Thrombocytopenia (HCC)  Suspect secondary to chronic liver disease  HTN (hypertension) Home furosemide  40 mg daily, spironolactone  25 mg daily were resumed Hydralazine  5 mg IV every 6 hours as needed for SBP greater 175, 5 days ordered  SIRS (systemic inflammatory response  syndrome) (HCC) With reported fever by EDP, increased heart rate, Blood cultures x 2 are in process UA is positive for large leukocytes Urine culture in process Continue with ceftriaxone  2 g IV daily  Hyperammonemia (HCC) Better than patient's baseline level  Serum total bilirubin elevated Check right upper quadrant ultrasound A.m. team to consider paracentesis as appropriate  Protein-calorie malnutrition, severe (HCC) Dietitian has been consulted  Pure hypercholesterolemia Home atorvastatin  40 mg nightly resumed  Chart reviewed.   DVT prophylaxis: AM team to initiate pharmacologic DVT prophylaxis when benefits outweigh the risk Code Status: DNR/DNI Diet: Heart healthy Family Communication: No Disposition Plan: Clinical course; guarded prognosis Consults called: Registered dietitian Admission status: Telemetry medical, observation  Past Medical History:  Diagnosis Date   (HFpEF) heart failure with preserved ejection fraction (HCC)    a.) TTE 03/20/2018: EF 60-65%, no RWMAs, mild LAE, mild-mod MR, PASP 42; b.) TTE 03/31/2021: EF 55-60%, no RWMAs, mild LVH, mild LAE, AoV sclerosis without stenosis; c.) TTE 03/25/2022: EF 60-65%, no RWMAs, AoV sclerosis without stenosis; d.) TTE 12/24/2022: EF 60-65%, no RWMAs, mild LVH, mild LAE.   Anasarca    Anemia    Anxiety    Aortic atherosclerosis (HCC)    Arthritis    Asthma    Bacteremia due to Escherichia coli 05/2023   Bacteremia due to Klebsiella pneumoniae 2023   Bilateral carotid artery disease (HCC)    Bradycardia    CAD (coronary artery disease) 10/21/2006   a.) MV 10/21/2006: small reversible defect involving the apical  lateral wall c/w possible ischemia; b.) MV: 03/24/2018: no ischemia; c.) MV 10/04/2021: LAD calcifications   CAD (coronary artery disease)    Cervical radiculopathy    Chest pain    a.) MV 03/21/2018: no ischemia; b.) MV 10/04/2021: no ischemia   Chronic back pain    Chronic common bile duct dilatation     Cirrhosis (HCC)    a.) (+) hepatic lesion on CT; likely hepatocellular carcinoma; b.) CT evidence of associated portal hypertension; c.) (+) abdominopelvic anasarca   COPD (chronic obstructive pulmonary disease) (HCC)    DDD (degenerative disc disease), lumbar    Depression    Frequent falls    GERD (gastroesophageal reflux disease)    HBV (hepatitis B virus) infection    HCV (hepatitis C virus)    Hernia of anterior abdominal wall    History of 2019 novel coronavirus disease (COVID-19) 04/16/2020   Homelessness    a.) limited on placement as patient is listed on [place] sex offender listing for exploitation of a minor (possession of child pornography); incarcerated 12 years (released 11/2017)   Hyperlipidemia    Hypertension    Infestation by bed bug 02/2022   Malingering    Migraine    Nephrolithiasis    Obesity    Portal hypertension (HCC)    PTSD (post-traumatic stress disorder)    a.) brief training in the [employer] at age 70; someone was killed in front of him   Pulmonary HTN (HCC)    a.) multiple CT scans desmontrating dilated PA c/w pHTN; b.) TTE 03/20/2018: PASP 42 mmHg   Sleep apnea    a.) no nocturnal PAP therapy   Sleep apnea    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)    Suicidal ideations    T2DM (type 2 diabetes mellitus) (HCC)    Thrombocytopenia (HCC)    a.) related to hepatic cirrhosis; spleen normal in size   TIA (transient ischemic attack)    TIA (transient ischemic attack) 09/30/2023   Past Surgical History:  Procedure Laterality Date   ANTERIOR CERVICAL DECOMP/DISCECTOMY FUSION N/A 07/01/2023   Procedure: ANTERIOR CERVICAL DISCECTOMY AND FUSION (FORGE);  Surgeon: Clois Fret, MD;  Location: [facility] ORS;  Service: Neurosurgery;  Laterality: N/A;  Keep as last case of the day per Dr Clois   ANTERIOR CERVICAL DISCECTOMY     APPENDECTOMY     CARDIAC CATHETERIZATION     CHOLECYSTECTOMY     EYE SURGERY     INNER EAR SURGERY     NOSE SURGERY      XI ROBOTIC LAPAROSCOPIC ASSISTED APPENDECTOMY N/A 11/05/2022   Procedure: XI ROBOTIC LAPAROSCOPIC ASSISTED APPENDECTOMY;  Surgeon: Rodolph Romano, MD;  Location: [facility] ORS;  Service: General;  Laterality: N/A;   Social History:  reports that he has been smoking cigarettes. He has never used smokeless tobacco. He reports that he does not currently use alcohol. He reports that he does not currently use drugs.  Allergies  Allergen Reactions   Bee Venom Anaphylaxis   Codeine  Itching    Only itching. Recently allergy tested at [facility]   Rsv Mrna Pre-F Virus Vaccine Swelling   Meloxicam     Other Reaction(s): ?overdose of local given at dentist--had trouble breathing   Codeine     Novocain [Procaine]    Sulfa Antibiotics Other (See Comments)   Sulfa Antibiotics     hallucinations   Family History  Problem Relation Age of Onset   Hypertension Mother    Heart disease Mother    Heart failure Maternal Grandmother    Family history: Family history reviewed and not pertinent.  Prior to Admission medications   Medication Sig Start Date End Date Taking? Authorizing Provider  acetaminophen  (TYLENOL ) 650 MG CR tablet Take 650 mg by mouth every 8 (eight) hours as needed for pain.    [provider]  albuterol  (PROVENTIL ) (2.5 MG/3ML) 0.083% nebulizer solution Take 2.5 mg by nebulization every 4 (four) hours as needed for wheezing or shortness of breath. 07/16/23   [provider]  aspirin  81 MG chewable tablet Chew 81 mg by mouth daily.    [provider]  atorvastatin  (LIPITOR) 40 MG tablet Take 40 mg by mouth daily.    [provider]  citalopram  (CELEXA ) 20 MG tablet Take 20 mg by mouth daily.    [provider]  feeding supplement (ENSURE ENLIVE / ENSURE PLUS) LIQD Take 237 mLs by mouth 3 (three) times daily between meals. 10/25/23   Barbarann Nest, MD  furosemide  (LASIX ) 20 MG tablet Take 2 tablets (40 mg total) by mouth daily. 02/03/24 03/04/24   Donette Ellouise LABOR, FNP  gabapentin  (NEURONTIN ) 300 MG capsule Take 300 mg by mouth 2 (two) times daily. 06/07/23   [provider]  lactulose  (CHRONULAC ) 10 GM/15ML solution Take 67.5 mLs (45 g total) by mouth 3 (three) times daily. Please titrate to have 2-3 soft bowel movements daily 01/12/24   Caleen Qualia, MD  methocarbamol  (ROBAXIN ) 500 MG tablet Take 500 mg by mouth 4 (four) times daily.    [provider]  Multiple Vitamin (MULTIVITAMIN WITH MINERALS) TABS tablet Take 1 tablet by mouth daily.    [provider]  nitroGLYCERIN  (NITROLINGUAL ) 0.4 MG/SPRAY spray Place 1  spray under the tongue every 5 (five) minutes x 3 doses as needed for chest pain. 10/30/23   Donette Ellouise LABOR, FNP  ondansetron  (ZOFRAN ) 4 MG tablet Take 1 tablet (4 mg total) by mouth every 6 (six) hours as needed for nausea. 06/21/23   Josette Ade, MD  pantoprazole  (PROTONIX ) 40 MG tablet Take 40 mg by mouth daily. 09/24/23   [provider]  phenol (CHLORASEPTIC) 1.4 % LIQD Use as directed 1 spray in the mouth or throat as needed for throat irritation / pain.    [provider]  QUEtiapine  (SEROQUEL ) 50 MG tablet Take 50 mg by mouth at bedtime. 11/19/23   [provider]  senna (SENOKOT) 8.6 MG TABS tablet Take 1 tablet (8.6 mg total) by mouth 2 (two) times daily as needed for mild constipation. 07/02/23   Gregory Edsel Ruth, PA  spironolactone  (ALDACTONE ) 25 MG tablet Take 1 tablet (25 mg total) by mouth daily. 01/12/24 02/11/24  Caleen Qualia, MD  VENTOLIN  HFA 108 (90 Base) MCG/ACT inhaler Inhale 2 puffs into the lungs every 4 (four) hours as needed for wheezing. 07/16/23   [provider]  XIFAXAN  550 MG TABS tablet Take 550 mg by mouth 2 (two) times daily. 09/24/23   [provider]   Physical Exam: Vitals:   02/17/24 1845 02/17/24 1903 02/17/24 1938 02/17/24 2202  BP: (!) 129/96   (!) 143/129  Pulse: 70   61  Resp: (!) 21   (!) 23  Temp:  (!) 103.3 F  (39.6 C) 98.7 F (37.1 C)   TempSrc:  Rectal Oral   SpO2: 99%   100%   Constitutional: appears chronically ill, older than chronological age Eyes: PERRL, lids and conjunctivae normal ENMT: Mucous membranes are moist. Posterior pharynx clear of any exudate or lesions. Age-appropriate dentition. Hearing appropriate Neck: normal, supple, no masses, no thyromegaly Respiratory: clear to auscultation bilaterally, no wheezing, no crackles. Normal respiratory effort. No accessory muscle use.  Cardiovascular: Regular rate and rhythm, no murmurs / rubs / gallops.  Bilateral lower extremity extremity edema, left greater than right. 2+ pedal pulses. No carotid bruits.  Abdomen: Distended abdomen, no tenderness, no masses palpated, no hepatosplenomegaly. Bowel sounds positive.  Musculoskeletal: no clubbing / cyanosis. No joint deformity upper and lower extremities. Good ROM, no contractures, no atrophy. Normal muscle tone.  Skin: no rashes, lesions, ulcers. No induration Neurologic: Sensation intact. Strength 5/5 in all 4.  Psychiatric: Normal judgment and insight. Alert and oriented x 3. Normal mood.   EKG: independently reviewed, showing sinus rhythm with rate of 70, QTc 504  Chest x-ray on Admission: I personally reviewed and I agree with radiologist reading as below.  DG Chest Port 1 View Result Date: 02/17/2024 CLINICAL DATA:  Questionable sepsis EXAM: PORTABLE CHEST 1 VIEW COMPARISON:  Chest x-ray 01/11/2024 FINDINGS: The heart is mildly enlarged. There is no focal lung infiltrate, pleural effusion or pneumothorax. Cervical spinal fusion plate is present. The IMPRESSION: Mild cardiomegaly. No acute pulmonary process. Electronically Signed   By: Greig Pique M.D.   On: 02/17/2024 19:59   Labs on Admission: I have personally reviewed following labs  CBC: Recent Labs  Lab 02/17/24 1929  WBC 7.2  NEUTROABS 6.2  HGB 8.3*  HCT 25.1*  MCV 94.0  PLT 57*   Basic Metabolic Panel: Recent Labs   Lab 02/17/24 1851  NA 137  K 3.7  CL 110  CO2 19*  GLUCOSE 79  BUN 19  CREATININE 1.33*  CALCIUM   8.9  MG 1.3*   GFR: CrCl cannot be calculated (Unknown ideal weight.).  Liver Function Tests: Recent Labs  Lab 02/17/24 1851  AST 37  ALT 19  ALKPHOS 51  BILITOT 2.5*  PROT 5.8*  ALBUMIN  2.4*   Recent Labs  Lab 02/17/24 1929  AMMONIA 58*   Coagulation Profile: Recent Labs  Lab 02/17/24 1851  INR 2.0*   Cardiac Enzymes: Recent Labs  Lab 02/17/24 1851  CKTOTAL 178   Urine analysis:    Component Value Date/Time   COLORURINE AMBER (A) 02/17/2024 1929   APPEARANCEUR CLOUDY (A) 02/17/2024 1929   LABSPEC 1.022 02/17/2024 1929   PHURINE 9.0 (H) 02/17/2024 1929   GLUCOSEU NEGATIVE 02/17/2024 1929   HGBUR SMALL (A) 02/17/2024 1929   BILIRUBINUR NEGATIVE 02/17/2024 1929   KETONESUR NEGATIVE 02/17/2024 1929   PROTEINUR >=300 (A) 02/17/2024 1929   NITRITE NEGATIVE 02/17/2024 1929   LEUKOCYTESUR LARGE (A) 02/17/2024 1929   This document was prepared using Dragon Voice Recognition software and may include unintentional dictation errors.  Dr. Sherre [facility]  If 7PM-7AM, please contact overnight-coverage provider If 7AM-7PM, please contact day attending provider [email or web address]  02/17/2024, 10:51 PM

## 2024-02-17 NOTE — Assessment & Plan Note (Signed)
 Suspect secondary to chronic liver disease

## 2024-02-17 NOTE — Assessment & Plan Note (Signed)
 On aspirin and atorvastatin ?

## 2024-02-18 ENCOUNTER — Encounter: Payer: Self-pay | Admitting: Internal Medicine

## 2024-02-18 ENCOUNTER — Observation Stay

## 2024-02-18 DIAGNOSIS — F418 Other specified anxiety disorders: Secondary | ICD-10-CM

## 2024-02-18 DIAGNOSIS — Y846 Urinary catheterization as the cause of abnormal reaction of the patient, or of later complication, without mention of misadventure at the time of the procedure: Secondary | ICD-10-CM | POA: Diagnosis present

## 2024-02-18 DIAGNOSIS — D696 Thrombocytopenia, unspecified: Secondary | ICD-10-CM

## 2024-02-18 DIAGNOSIS — K7469 Other cirrhosis of liver: Secondary | ICD-10-CM | POA: Diagnosis not present

## 2024-02-18 DIAGNOSIS — A419 Sepsis, unspecified organism: Secondary | ICD-10-CM

## 2024-02-18 DIAGNOSIS — Y738 Miscellaneous gastroenterology and urology devices associated with adverse incidents, not elsewhere classified: Secondary | ICD-10-CM | POA: Diagnosis present

## 2024-02-18 DIAGNOSIS — I10 Essential (primary) hypertension: Secondary | ICD-10-CM

## 2024-02-18 DIAGNOSIS — F332 Major depressive disorder, 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 psychotic features: Secondary | ICD-10-CM | POA: Diagnosis present

## 2024-02-18 DIAGNOSIS — R6521 Severe sepsis with septic shock: Secondary | ICD-10-CM

## 2024-02-18 DIAGNOSIS — E7800 Pure hypercholesterolemia, unspecified: Secondary | ICD-10-CM

## 2024-02-18 DIAGNOSIS — N390 Urinary tract infection, site not specified: Secondary | ICD-10-CM | POA: Diagnosis present

## 2024-02-18 DIAGNOSIS — E7220 Disorder of urea cycle metabolism, unspecified: Secondary | ICD-10-CM

## 2024-02-18 DIAGNOSIS — E872 Acidosis, unspecified: Secondary | ICD-10-CM | POA: Diagnosis present

## 2024-02-18 DIAGNOSIS — Z8673 Personal history of transient ischemic attack (TIA), and cerebral infarction without residual deficits: Secondary | ICD-10-CM

## 2024-02-18 DIAGNOSIS — E1122 Type 2 diabetes mellitus with diabetic chronic kidney disease: Secondary | ICD-10-CM | POA: Diagnosis present

## 2024-02-18 DIAGNOSIS — J439 Emphysema, unspecified: Secondary | ICD-10-CM

## 2024-02-18 DIAGNOSIS — N179 Acute kidney failure, unspecified: Secondary | ICD-10-CM | POA: Diagnosis present

## 2024-02-18 DIAGNOSIS — I5032 Chronic diastolic (congestive) heart failure: Secondary | ICD-10-CM | POA: Diagnosis present

## 2024-02-18 DIAGNOSIS — E66812 Obesity, class 2: Secondary | ICD-10-CM | POA: Diagnosis present

## 2024-02-18 DIAGNOSIS — K7460 Unspecified cirrhosis of liver: Secondary | ICD-10-CM | POA: Diagnosis present

## 2024-02-18 DIAGNOSIS — A4159 Other Gram-negative sepsis: Secondary | ICD-10-CM | POA: Diagnosis present

## 2024-02-18 DIAGNOSIS — D72819 Decreased white blood cell count, unspecified: Secondary | ICD-10-CM | POA: Diagnosis present

## 2024-02-18 DIAGNOSIS — I2585 Chronic coronary microvascular dysfunction: Secondary | ICD-10-CM | POA: Diagnosis not present

## 2024-02-18 DIAGNOSIS — W06XXXA Fall from bed, initial encounter: Secondary | ICD-10-CM | POA: Diagnosis present

## 2024-02-18 DIAGNOSIS — Z66 Do not resuscitate: Secondary | ICD-10-CM | POA: Diagnosis present

## 2024-02-18 DIAGNOSIS — J4489 Other specified chronic obstructive pulmonary disease: Secondary | ICD-10-CM | POA: Diagnosis present

## 2024-02-18 DIAGNOSIS — R531 Weakness: Secondary | ICD-10-CM | POA: Diagnosis not present

## 2024-02-18 DIAGNOSIS — D649 Anemia, unspecified: Secondary | ICD-10-CM | POA: Diagnosis present

## 2024-02-18 DIAGNOSIS — K766 Portal hypertension: Secondary | ICD-10-CM | POA: Diagnosis present

## 2024-02-18 DIAGNOSIS — Z8616 Personal history of COVID-19: Secondary | ICD-10-CM | POA: Diagnosis not present

## 2024-02-18 DIAGNOSIS — E43 Unspecified severe protein-calorie malnutrition: Secondary | ICD-10-CM

## 2024-02-18 DIAGNOSIS — R7881 Bacteremia: Secondary | ICD-10-CM | POA: Diagnosis present

## 2024-02-18 DIAGNOSIS — E1169 Type 2 diabetes mellitus with other specified complication: Secondary | ICD-10-CM | POA: Diagnosis present

## 2024-02-18 DIAGNOSIS — G992 Myelopathy in diseases classified elsewhere: Secondary | ICD-10-CM | POA: Diagnosis present

## 2024-02-18 DIAGNOSIS — T83511A Infection and inflammatory reaction due to indwelling urethral catheter, initial encounter: Secondary | ICD-10-CM | POA: Diagnosis present

## 2024-02-18 DIAGNOSIS — I700 Atherosclerosis of aorta: Secondary | ICD-10-CM | POA: Diagnosis present

## 2024-02-18 DIAGNOSIS — N1831 Chronic kidney disease, stage 3a: Secondary | ICD-10-CM | POA: Diagnosis present

## 2024-02-18 DIAGNOSIS — I130 Hypertensive heart and chronic kidney disease with heart failure and stage 1 through stage 4 chronic kidney disease, or unspecified chronic kidney disease: Secondary | ICD-10-CM | POA: Diagnosis present

## 2024-02-18 DIAGNOSIS — I272 Pulmonary hypertension, unspecified: Secondary | ICD-10-CM | POA: Diagnosis present

## 2024-02-18 LAB — BLOOD CULTURE ID PANEL (REFLEXED) - BCID2
A.calcoaceticus-baumannii: NOT DETECTED
Bacteroides fragilis: NOT DETECTED
CTX-M ESBL: NOT DETECTED
Candida albicans: NOT DETECTED
Candida auris: NOT DETECTED
Candida glabrata: NOT DETECTED
Candida krusei: NOT DETECTED
Candida parapsilosis: NOT DETECTED
Candida tropicalis: NOT DETECTED
Carbapenem resist OXA 48 LIKE: NOT DETECTED
Carbapenem resistance IMP: NOT DETECTED
Carbapenem resistance KPC: NOT DETECTED
Carbapenem resistance NDM: NOT DETECTED
Carbapenem resistance VIM: NOT DETECTED
Cryptococcus neoformans/gattii: NOT DETECTED
Enterobacter cloacae complex: NOT DETECTED
Enterobacterales: DETECTED — AB
Enterococcus Faecium: NOT DETECTED
Enterococcus faecalis: NOT DETECTED
Escherichia coli: NOT DETECTED
Haemophilus influenzae: NOT DETECTED
Klebsiella aerogenes: NOT DETECTED
Klebsiella oxytoca: NOT DETECTED
Klebsiella pneumoniae: DETECTED — AB
Listeria monocytogenes: NOT DETECTED
Neisseria meningitidis: NOT DETECTED
Proteus species: DETECTED — AB
Pseudomonas aeruginosa: NOT DETECTED
Salmonella species: NOT DETECTED
Serratia marcescens: NOT DETECTED
Staphylococcus aureus (BCID): NOT DETECTED
Staphylococcus epidermidis: NOT DETECTED
Staphylococcus lugdunensis: NOT DETECTED
Staphylococcus species: NOT DETECTED
Stenotrophomonas maltophilia: NOT DETECTED
Streptococcus agalactiae: NOT DETECTED
Streptococcus pneumoniae: NOT DETECTED
Streptococcus pyogenes: NOT DETECTED
Streptococcus species: NOT DETECTED

## 2024-02-18 LAB — CBC
HCT: 26.2 % — ABNORMAL LOW (ref 39.0–52.0)
Hemoglobin: 8.6 g/dL — ABNORMAL LOW (ref 13.0–17.0)
MCH: 30.9 pg (ref 26.0–34.0)
MCHC: 32.8 g/dL (ref 30.0–36.0)
MCV: 94.2 fL (ref 80.0–100.0)
Platelets: 51 10*3/uL — ABNORMAL LOW (ref 150–400)
RBC: 2.78 MIL/uL — ABNORMAL LOW (ref 4.22–5.81)
RDW: 15.2 % (ref 11.5–15.5)
WBC: 6.4 10*3/uL (ref 4.0–10.5)
nRBC: 0 % (ref 0.0–0.2)

## 2024-02-18 LAB — HEPATIC FUNCTION PANEL
ALT: 21 U/L (ref 0–44)
AST: 43 U/L — ABNORMAL HIGH (ref 15–41)
Albumin: 2.5 g/dL — ABNORMAL LOW (ref 3.5–5.0)
Alkaline Phosphatase: 48 U/L (ref 38–126)
Bilirubin, Direct: 0.6 mg/dL — ABNORMAL HIGH (ref 0.0–0.2)
Indirect Bilirubin: 1.9 mg/dL — ABNORMAL HIGH (ref 0.3–0.9)
Total Bilirubin: 2.5 mg/dL — ABNORMAL HIGH (ref 0.0–1.2)
Total Protein: 5.8 g/dL — ABNORMAL LOW (ref 6.5–8.1)

## 2024-02-18 LAB — BASIC METABOLIC PANEL WITH GFR
Anion gap: 6 (ref 5–15)
BUN: 21 mg/dL (ref 8–23)
CO2: 22 mmol/L (ref 22–32)
Calcium: 8.9 mg/dL (ref 8.9–10.3)
Chloride: 111 mmol/L (ref 98–111)
Creatinine, Ser: 1.45 mg/dL — ABNORMAL HIGH (ref 0.61–1.24)
GFR, Estimated: 51 mL/min — ABNORMAL LOW (ref >60)
Glucose, Bld: 77 mg/dL (ref 70–99)
Potassium: 3.8 mmol/L (ref 3.5–5.1)
Sodium: 139 mmol/L (ref 135–145)

## 2024-02-18 LAB — GLUCOSE, CAPILLARY
Glucose-Capillary: 69 mg/dL — ABNORMAL LOW (ref 70–99)
Glucose-Capillary: 70 mg/dL (ref 70–99)
Glucose-Capillary: 89 mg/dL (ref 70–99)
Glucose-Capillary: 76 mg/dL (ref 70–99)

## 2024-02-18 LAB — URINE CULTURE

## 2024-02-18 MED ORDER — CHLORHEXIDINE GLUCONATE CLOTH 2 % EX PADS
6.0000 | MEDICATED_PAD | Freq: Every day | CUTANEOUS | Status: DC
  Administered 2024-02-18 – 2024-02-22 (×5): 6 via TOPICAL

## 2024-02-18 MED ORDER — SODIUM CHLORIDE 0.9 % IV SOLN
2.0000 g | INTRAVENOUS | Status: DC
  Administered 2024-02-18 – 2024-02-19 (×2): 2 g via INTRAVENOUS
  Filled 2024-02-18 (×2): qty 20

## 2024-02-18 MED ORDER — ADULT MULTIVITAMIN W/MINERALS CH
1.0000 | ORAL_TABLET | Freq: Every day | ORAL | Status: DC
  Administered 2024-02-18 – 2024-02-22 (×5): 1 via ORAL
  Filled 2024-02-18 (×5): qty 1

## 2024-02-18 MED ORDER — ENSURE PLUS HIGH PROTEIN PO LIQD
237.0000 mL | Freq: Two times a day (BID) | ORAL | Status: DC
  Administered 2024-02-18 – 2024-02-22 (×8): 237 mL via ORAL

## 2024-02-18 NOTE — Plan of Care (Signed)
 The patient arrived to the floor at 2300. Alert and confused. Resist ADL care needs reassurance. Clemens out of bed at group home, has UTI. IV to L arm 20G. Lactated Ringers  Bolus. The patient has a foley catheter for bladder obstruction. Incontinent  of bowels x 2. BLE have + 3 pitting edema. Ted hose, remove for 30 minutes every 8 hours and reapply. Furosimide 40mg  IV push x1 dose was given per orders. Heart Healthy diet. O2 @ 2LPM via [place] continuosly. The patient has been calm and resting comfortably most of the night.

## 2024-02-18 NOTE — Progress Notes (Signed)
 Spoke with louanne, I dont think she is his main contact.  Update her that he was in the hospital

## 2024-02-18 NOTE — TOC Initial Note (Signed)
 Transition of Care [facility]) - Initial/Assessment Note    Patient Details  Name: [name] MRN: [id number] Date of Birth: [birth year]  [facility] CM/SW Contact:    Dalia GORMAN Fuse, RN Phone Number: 02/18/2024, 2:56 PM  Clinical Narrative:                  Patient is from [facility]. Patient has been active with [facility] in the past for Home Health PT/OT.  The facility contact is [name] [phone].  [name] will transport the patient when he is medically clear for dc.  Expected Discharge Plan: Home w Home Health Services Barriers to Discharge: Continued Medical Work up   Patient Goals and CMS Choice            Expected Discharge Plan and Services   Discharge Planning Services: CM Consult   Living arrangements for the past 2 months: Group Home                                      Prior Living Arrangements/Services Living arrangements for the past 2 months: Group Home Lives with:: Facility Resident              Current home services: Home OT, Home PT    Activities of Daily Living   ADL Screening (condition at time of admission) Independently performs ADLs?: No Does the patient have a NEW difficulty with bathing/dressing/toileting/self-feeding that is expected to last >3 days?: No Does the patient have a NEW difficulty with getting in/out of bed, walking, or climbing stairs that is expected to last >3 days?: No Does the patient have a NEW difficulty with communication that is expected to last >3 days?: No Is the patient deaf or have difficulty hearing?: No Does the patient have difficulty seeing, even when wearing glasses/contacts?: No Does the patient have difficulty concentrating, remembering, or making decisions?: Yes  Permission Sought/Granted                  Emotional Assessment              Admission diagnosis:  Complicated UTI (urinary tract infection) [N39.0] Bacteremia [R78.81] Patient Active Problem List    Diagnosis Date Noted   Bacteremia 02/18/2024   Complicated UTI (urinary tract infection) 02/17/2024   Chronic indwelling Foley catheter 01/03/2024   Hypomagnesemia 12/30/2023   Depression with anxiety 12/30/2023   Altered mental status 11/12/2023   Chronic kidney disease, stage 3a (HCC) 10/21/2023   Myocardial injury 10/21/2023   CKD stage 3a, GFR 45-59 ml/min (HCC) - baseline scr 1.3 10/01/2023   Hepatic encephalopathy (HCC) 09/30/2023   Liver cirrhosis (HCC) 09/30/2023   Essential hypertension 09/30/2023   Diabetes mellitus type 2, noninsulin dependent (HCC) 09/30/2023   CAD (coronary artery disease) 09/30/2023   Hyperlipidemia 09/30/2023   Hepatic encephalopathy (HCC) 08/01/2023   Hyperammonemia (HCC) 07/30/2023   Cervical spinal stenosis 07/01/2023   Cervical myelopathy (HCC) 06/21/2023   Cervical stenosis of spine 06/21/2023   Cervical radiculopathy 06/20/2023   E coli bacteremia 06/17/2023   UTI (urinary tract infection) 06/17/2023   Left leg cellulitis 03/23/2023   Leukocytosis 03/23/2023   Left leg pain 03/23/2023   Suicidal ideation 01/14/2023   Acute pain 01/14/2023   Adrenal insufficiency (HCC) 12/26/2022   Orthostatic hypotension 12/24/2022   Rib pain on right side 12/24/2022   Hepatitis B infection 12/24/2022   Myocardial ischemia 12/24/2022  Acute appendicitis 11/05/2022   Pancytopenia (HCC) 11/05/2022   Sinus bradycardia 11/05/2022   Acute metabolic encephalopathy 09/22/2022   Liver lesion 09/22/2022   Obesity (BMI 30-39.9) 09/22/2022   Acute hepatic encephalopathy (HCC) 09/22/2022   MDD (major depressive disorder), recurrent severe, 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   Major depressive disorder, 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 psychotic features (HCC) 09/15/2022   Protein-calorie malnutrition, severe (HCC) 08/31/2022   Hypotension 08/31/2022   Weakness    Hx of transient ischemic attack (TIA) 04/28/2022   Obesity    Chronic diastolic CHF (congestive heart  failure) (HCC)    Depression    Septic shock (HCC) 03/28/2022   Bacteremia due to Klebsiella pneumoniae 03/28/2022   Hypoglycemia 03/28/2022   Syncope 03/24/2022   Hypokalemia 03/24/2022   Fever 03/24/2022   COPD (chronic obstructive pulmonary disease) (HCC)    Elevated troponin    Abnormal LFTs    History of hepatitis C    AKI (acute kidney injury) (HCC)    Pure hypercholesterolemia    Obesity, Class III, BMI 40-49.9 (morbid obesity) 04/18/2020   Thrombocytopenia (HCC) 04/17/2020   Leukopenia 04/17/2020   HTN (hypertension) 04/17/2018   Uncontrolled type 2 diabetes mellitus with hyperglycemia, without long-term current use of insulin  (HCC) 04/17/2018   Lymphedema 04/17/2018   Normocytic anemia 04/07/2018   Chest pain 03/20/2018   Unsheltered homelessness 12/16/2017   PCP:  [facility] Pharmacy:   [id number] - [address]), [place] - [address] [place]) [place] [address] Phone: [phone] Fax: [phone]  [facility] - [facility] [address] [place] [address] Phone: [phone] Fax: [phone]  [facility] - [place], [place] - [place] [address] [place] [address] Phone: [phone] Fax: [phone]     Social Drivers of Health (SDOH) Social History: SDOH Screenings   Food Insecurity: No Food Insecurity (02/17/2024)  Housing: Low Risk  (02/18/2024)  Transportation Needs: No Transportation Needs (02/17/2024)  Utilities: Not At Risk (02/17/2024)  Alcohol Screen: Low Risk  (09/24/2022)  Depression (PHQ2-9): High Risk (04/26/2022)  Financial Resource Strain: High Risk (04/27/2022)  Physical Activity: Sufficiently Active (04/16/2018)  Social Connections: Socially Isolated (02/17/2024)  Stress: Stress Concern Present (04/16/2018)  Tobacco Use: High Risk (02/18/2024)   SDOH Interventions:     Readmission Risk  Interventions    11/29/2023    5:03 PM 11/13/2023    2:21 PM 11/12/2023    4:00 PM  Readmission Risk Prevention Plan  Transportation Screening Not Complete  Not Complete  Medication Review Oceanographer) Not Complete Complete Not Complete  PCP or Specialist appointment within 3-5 days of discharge Not Complete Complete Complete  HRI or Home Care Consult Not Complete Complete   SW Recovery Care/Counseling Consult Not Complete Complete   Palliative Care Screening Not Applicable Complete   Skilled Nursing Facility Not Applicable Not Applicable

## 2024-02-18 NOTE — Progress Notes (Signed)
 The patient is a yellow MEWS . 101.6, 63, (78),28, 98% 2LPM at 0546am. Recheck at 0645am 102.9, 68, (65), 113/48, 100% on 2 LPM.  PRN Tylenol  650mg  and PRN nebulizer given Tylenol  w/o effect. On call Delayne Solian notified.

## 2024-02-18 NOTE — Evaluation (Signed)
 Physical Therapy Evaluation Patient Details Name: [name] MRN: [id number] DOB: [birth year] Today's Date: 02/18/2024  History of Present Illness  Pt is a 73 year old male with history of hepatitis C, hypertension, COPD, hyperlipidemia, non-insulin -dependent diabetes type 2, depression, history of TIA, history of multiple admissions for hepatic encephalopathy, presents to the emergency department for chief concerns of falling out of bed at care home. MD assessment includes: septic shock, complicated UTI, liver cirrhosis, thrombocytopenia, severe protein-calorie malnutrition, and weakness.   Clinical Impression  Pt was pleasantly confused but motivated to participate during the session and put forth good effort throughout.  Pt required assistance with all functional tasks per below and presented with poor static sitting and standing balance.  Pt's static sitting balance did grossly improve as the session progressed after anterior weight shifting activities were performed in sitting at the EOB.  Pt required extensive multi-modal cuing for proper sequencing with functional tasks with poor carryover and was generally confused during the session.  Of note, pt found on 1.5LO2/min with SpO2 98% with nursing giving the ok for a trial on room air.  Pt's SpO2 remained WNL dropping to a low of 97% during the session on room air and pt was left on RA at the end of the session with nsg notified.  Pt will benefit from continued PT services upon discharge to safely address deficits listed in patient problem list for decreased caregiver assistance and eventual return to PLOF.          If plan is discharge home, recommend the following: Two people to help with walking and/or transfers;A lot of help with bathing/dressing/bathroom;Assistance with cooking/housework;Direct supervision/assist for medications management;Supervision due to cognitive status;Assist for transportation;Help with stairs or ramp for entrance    Can travel by private vehicle   No    Equipment Recommendations Other (comment) (TBD at next venue of care)  Recommendations for Other Services       Functional Status Assessment Patient has had a recent decline in their functional status and demonstrates the ability to make significant improvements in function in a reasonable and predictable amount of time.     Precautions / Restrictions Precautions Precautions: Fall Restrictions Weight Bearing Restrictions Per Provider Order: No      Mobility  Bed Mobility Overal bed mobility: Needs Assistance Bed Mobility: Supine to Sit, Sit to Supine     Supine to sit: +2 for physical assistance, Max assist Sit to supine: +2 for physical assistance, Max assist   General bed mobility comments: Mod multi-modal cues for sequencing with heavy +2 assist for BLE and trunk control    Transfers Overall transfer level: Needs assistance Equipment used: Rolling walker (2 wheels) Transfers: Sit to/from Stand Sit to Stand: Mod assist, +2 safety/equipment, From elevated surface           General transfer comment: Mod multi-modal cues for foot and hand positioning and for increased trunk flexion    Ambulation/Gait Ambulation/Gait assistance: Min assist Gait Distance (Feet): 3 Feet Assistive device: Rolling walker (2 wheels) Gait Pattern/deviations: Step-to pattern, Trunk flexed, Shuffle Gait velocity: decreased     General Gait Details: Pt able to take several very small lateral steps at the EOB with min A for stability and to guide the RW  Stairs            Wheelchair Mobility     Tilt Bed    Modified Rankin (Stroke Patients Only)       Balance Overall balance assessment: Needs assistance  Sitting balance-Leahy Scale: Poor Sitting balance - Comments: Posterior instability initially that improved as the session progressed Postural control: Posterior lean Standing balance support: Bilateral upper extremity  supported, During functional activity, Reliant on assistive device for balance Standing balance-Leahy Scale: Poor Standing balance comment: Min A for stability in standing                             Pertinent Vitals/Pain Pain Assessment Pain Assessment: No/denies pain    Home Living Family/patient expects to be discharged to:: Other (Comment)                   Additional Comments: Pt is confused this date and a poor historian.  History is from a combination of chart review from recent prior admission and from patient.  Pt resides at a care home.    Prior Function Prior Level of Function : Needs assist;History of Falls (last six months)             Mobility Comments: Mod Ind amb in the group home with a rollator, 6 falls in the last 6 months secondary to LE buckling, uses a w/c in the community ADLs Comments: Group home staff assists with ADL's, IADL's, and medication management     Extremity/Trunk Assessment   Upper Extremity Assessment Upper Extremity Assessment: Generalized weakness    Lower Extremity Assessment Lower Extremity Assessment: Generalized weakness       Communication   Communication Communication: Impaired Factors Affecting Communication: Hearing impaired    Cognition Arousal: Alert Behavior During Therapy: WFL for tasks assessed/performed   PT - Cognitive impairments: No family/caregiver present to determine baseline, Orientation, Awareness, Initiation, Problem solving, Safety/Judgement   Orientation impairments: Person, Place                     Following commands: Impaired Following commands impaired: Follows one step commands inconsistently, Follows one step commands with increased time     Cueing Cueing Techniques: Verbal cues, Tactile cues, Visual cues     General Comments      Exercises  Anterior weight shifting activities in sitting to address posterior lean and to facilitate improved transfer performance     Assessment/Plan    PT Assessment Patient needs continued PT services  PT Problem List Decreased strength;Decreased activity tolerance;Decreased balance;Decreased mobility;Decreased knowledge of use of DME       PT Treatment Interventions DME instruction;Gait training;Functional mobility training;Therapeutic activities;Therapeutic exercise;Balance training;Patient/family education    PT Goals (Current goals can be found in the Care Plan section)  Acute Rehab PT Goals Patient Stated Goal: To get stronger PT Goal Formulation: With patient Time For Goal Achievement: 03/02/24 Potential to Achieve Goals: Good    Frequency Min 2X/week     Co-evaluation               AM-PAC PT 6 Clicks Mobility  Outcome Measure Help needed turning from your back to your side while in a flat bed without using bedrails?: A Lot Help needed moving from lying on your back to sitting on the side of a flat bed without using bedrails?: A Lot Help needed moving to and from a bed to a chair (including a wheelchair)?: A Lot Help needed standing up from a chair using your arms (e.g., wheelchair or bedside chair)?: A Lot Help needed to walk in hospital room?: Total Help needed climbing 3-5 steps with a railing? : Total 6 Click Score:  10    End of Session Equipment Utilized During Treatment: Gait belt Activity Tolerance: Patient tolerated treatment well Patient left: in bed;with call bell/phone within reach;with bed alarm set Nurse Communication: Mobility status;Other (comment) (Pt left on room air at end of session with SpO2 97-98%) PT Visit Diagnosis: Unsteadiness on feet (R26.81);History of falling (Z91.81);Difficulty in walking, not elsewhere classified (R26.2);Muscle weakness (generalized) (M62.81)    Time: 8486-8462 PT Time Calculation (min) (ACUTE ONLY): 24 min   Charges:   PT Evaluation $PT Eval Moderate Complexity: 1 Mod PT Treatments $Therapeutic Activity: 8-22 mins PT General  Charges $$ ACUTE PT VISIT: 1 Visit    D. Glendia Bertin PT, DPT 02/18/24, 4:15 PM

## 2024-02-18 NOTE — Assessment & Plan Note (Signed)
On Seroquel at night

## 2024-02-18 NOTE — Progress Notes (Signed)
 Progress Note   Patient: [name] FMW:[id number] DOB: 13-Nov-1950 DOA: 02/17/2024     0 DOS: the patient was seen and examined on 02/18/2024   Brief hospital course: [name] is a 73 year old male with history of hepatitis C, hypertension, COPD, hyperlipidemia, non-insulin -dependent diabetes type 2, depression, history of TIA, history of multiple admissions for hepatic encephalopathy, presents emergency department for chief concerns of falling out of bed at care home.  Vitals in the ED showed T of 98.7, rr 21, heart rate 70, blood pressure 129/96, SpO2 99% on room air.  Per EDP, there was an initial record of fever of T max of 103.3.  Serum sodium is 137, potassium 3.7, chloride 110, bicarb 19, BUN of 19, serum creatinine 1.33, EGFR 56, nonfasting blood glucose 79, WBC 7.2, hemoglobin 8.3, platelets of 57.  CK level was 178.  Procalcitonin was mildly elevated at 0.31.  UA was positive for large leukocytes.  Ammonia level is 58.  Lactic acid was initially 1.9 and on repeat was greater than 9.0.  ED treatment: Ceftriaxone  2 g IV one-time dose, LR 1 L bolus x 2.  6/24.  Blood cultures positive for Klebsiella and Proteus.  Patient on Rocephin .  Patient has chronic Foley catheter.  Asked nursing staff to change if not already changed.  Assessment and Plan: * Septic shock (HCC) Present on admission.  I suspect urine source with chronic catheter increasing risk for infection.  Asked nursing staff to change catheter.  Patient with fever and tachypnea present on admission.  Patient with a lactic acidosis of 9.  Positive blood cultures with Proteus and Klebsiella.  Continue IV Rocephin .  Complicated UTI (urinary tract infection) Change Foley catheter.  Continue Rocephin .  Liver cirrhosis (HCC) Continue Xifaxan  and lactulose .  Patient has borderline elevated ammonia level.  Patient has thrombocytopenia.  HTN (hypertension) Patient on Lasix  and Aldactone   Pure  hypercholesterolemia Continue Lipitor  Thrombocytopenia (HCC) Secondary to cirrhosis  Protein-calorie malnutrition, severe (HCC) Dietitian has been consulted  Hx of transient ischemic attack (TIA) On aspirin  and atorvastatin   Depression with anxiety On Seroquel  at night  Weakness Physical therapy evaluation  COPD (chronic obstructive pulmonary disease) (HCC) Respiratory status stable  Hyperammonemia (HCC) Better than patient's baseline level        Subjective: Patient came in with a fall and weakness found to have a urinary tract infection but then blood cultures positive now bacteremic.  Physical Exam: Vitals:   02/18/24 0526 02/18/24 0645 02/18/24 0735 02/18/24 1312  BP: 107/63 (!) 113/48 (!) 114/44 (!) 108/56  Pulse: 62 68 69 (!) 55  Resp: 19 20 20    Temp: (!) 101.6 F (38.7 C) (!) 102.9 F (39.4 C) (!) 101 F (38.3 C) (!) 101 F (38.3 C)  TempSrc: Oral Oral Oral   SpO2: 98% 100% 98% 100%  Weight:      Height:       Physical Exam HENT:     Head: Normocephalic.   Eyes:     General: Lids are normal.     Conjunctiva/sclera: Conjunctivae normal.    Cardiovascular:     Rate and Rhythm: Normal rate and regular rhythm.     Heart sounds: Normal heart sounds, S1 normal and S2 normal.  Pulmonary:     Breath sounds: Examination of the right-lower field reveals decreased breath sounds. Examination of the left-lower field reveals decreased breath sounds. Decreased breath sounds present. No wheezing, rhonchi or rales.  Abdominal:     Palpations: Abdomen  is soft.     Tenderness: There is no abdominal tenderness.   Musculoskeletal:     Right lower leg: No swelling.     Left lower leg: No swelling.   Skin:    General: Skin is warm.     Findings: No rash.   Neurological:     Mental Status: He is alert.     Data Reviewed: Creatinine 1.45, total bilirubin 2.5, indirect bilirubin 1.9, AST 43, electrolytes normal, lactic acid was 9 upon presentation, white  blood count 6.4, hemoglobin 8.6, platelet count 51  Family Communication: left message for Louanne  Disposition: Status is: Inpatient Remains inpatient appropriate because: With positive blood cultures we will change to inpatient and continue IV antibiotics.  Follow-up sensitivities  Planned Discharge Destination: To be determined    Time spent: 28 minutes  Author: Charlie Patterson, MD 02/18/2024 2:28 PM  For on call review [email or web address].

## 2024-02-18 NOTE — Assessment & Plan Note (Addendum)
 Present on admission.  I suspect urine source with chronic catheter increasing risk for infection.  Asked nursing staff to change catheter.  Patient with fever and tachypnea present on admission.  Patient with a lactic acidosis of 9.  Positive blood cultures with Proteus and Klebsiella.  Continue IV Rocephin .

## 2024-02-18 NOTE — Progress Notes (Signed)
 Initial Nutrition Assessment  DOCUMENTATION CODES:   Obesity unspecified  INTERVENTION:   -Liberalize diet to 2 gram sodium for wider variety of meal selections -MVI with minerals daily -Ensure Plus High Protein po BID, each supplement provides 350 kcal and 20 grams of protein   NUTRITION DIAGNOSIS:   Increased nutrient needs related to chronic illness (COPD) as evidenced by estimated needs.  GOAL:   Patient will meet greater than or equal to 90% of their needs  MONITOR:   PO intake, Supplement acceptance  REASON FOR ASSESSMENT:   Consult Assessment of nutrition requirement/status  ASSESSMENT:   Pt with history of hepatitis C, hypertension, COPD, hyperlipidemia, non-insulin -dependent diabetes type 2, depression, history of TIA, history of multiple admissions for hepatic encephalopathy, presents for chief concerns of falling out of bed at care home.  Pt admitted with complicated UTI and liver cirrhosis.   Reviewed I/O's: -602 ml x 24 hours  UOP: 600 ml x 24 hours  Spoke with pt at bedside, who was pleasant and in good spirits at time of visit. Pt confirms that he resides at [facility] and has had a pleasant experience there; he estimates he has been there for 4 months. Pt shares that he has a good appetite and consumes 3 meals per day. He denies any chewing or swallowing issues at this time.   Pt denies any weight loss; he reports I've gained weight. Noted pt looks better in comparison to when RD evaluated him on prior admission. Reviewed wt hx; no wt loss noted over the past 3 months.   Discussed importance of good meal and supplement intake to promote healing. Pt amenable to supplements.   RD assisted pt with tray set up for breakfast.   Medications reviewed and include lasix , lactulose , protonix , rocephin , and aldactone .   Lab Results  Component Value Date   HGBA1C 4.9 09/30/2023   PTA DM medications are none.   Labs reviewed: Mg: 1.3, CBGS:  68-69 (inpatient orders for glycemic control are 0-9 units insulin  aspart TID with meals and 0-5 units insulin  aspart daily at bedtime).    NUTRITION - FOCUSED PHYSICAL EXAM:  Flowsheet Row Most Recent Value  Orbital Region No depletion  Upper Arm Region No depletion  Thoracic and Lumbar Region No depletion  Buccal Region No depletion  Temple Region No depletion  Clavicle Bone Region No depletion  Clavicle and Acromion Bone Region No depletion  Scapular Bone Region No depletion  Dorsal Hand No depletion  Patellar Region No depletion  Anterior Thigh Region No depletion  Posterior Calf Region No depletion  Edema (RD Assessment) Mild  Hair Reviewed  Eyes Reviewed  Mouth Reviewed  Skin Reviewed  Nails Reviewed    Diet Order:   Diet Order             Diet 2 gram sodium Fluid consistency: Thin  Diet effective now                   EDUCATION NEEDS:   Education needs have been addressed  Skin:  Skin Assessment: Reviewed RN Assessment  Last BM:  02/18/24 (type 5)  Height:   Ht Readings from Last 1 Encounters:  02/17/24 5' 6 (1.676 m)    Weight:   Wt Readings from Last 1 Encounters:  02/17/24 111.9 kg    Ideal Body Weight:  64.5 kg  BMI:  Body mass index is 39.82 kg/m.  Estimated Nutritional Needs:   Kcal:  1900-2100  Protein:  100-115 grams  Fluid:  1.9-2.1 L    Margery ORN, RD, LDN, CDCES Registered Dietitian III Certified Diabetes Care and Education Specialist If unable to reach this RD, please use RD Inpatient group chat on secure chat between hours of 8am-4 pm daily

## 2024-02-18 NOTE — Progress Notes (Signed)
 PHARMACY - PHYSICIAN COMMUNICATION CRITICAL VALUE ALERT - BLOOD CULTURE IDENTIFICATION (BCID)  [name] is an 73 y.o. male who presented to [facility] on 02/17/2024 with a chief complaint of fall from wheelchair, altered mental status and fever  Assessment:  blood culture from 6/23 with GNR and BCID detects K. Pneumoniae and Proteus species.   Concern for CAUTI or intra-abdominal infection with PMH advanced liver cirrhosis  Name of physician (or Provider) Contacted: Dr Josette  Current antibiotics: Ceftriaxone  2gm IV q24h  Changes to prescribed antibiotics recommended:  Patient is on recommended antibiotics - No changes needed  Results for orders placed or performed during the hospital encounter of 02/17/24  Blood Culture ID Panel (Reflexed) (Collected: 02/17/2024  6:54 PM)  Result Value Ref Range   Enterococcus faecalis NOT DETECTED NOT DETECTED   Enterococcus Faecium NOT DETECTED NOT DETECTED   Listeria monocytogenes NOT DETECTED NOT DETECTED   Staphylococcus species NOT DETECTED NOT DETECTED   Staphylococcus aureus (BCID) NOT DETECTED NOT DETECTED   Staphylococcus epidermidis NOT DETECTED NOT DETECTED   Staphylococcus lugdunensis NOT DETECTED NOT DETECTED   Streptococcus species NOT DETECTED NOT DETECTED   Streptococcus agalactiae NOT DETECTED NOT DETECTED   Streptococcus pneumoniae NOT DETECTED NOT DETECTED   Streptococcus pyogenes NOT DETECTED NOT DETECTED   A.calcoaceticus-baumannii NOT DETECTED NOT DETECTED   Bacteroides fragilis NOT DETECTED NOT DETECTED   Enterobacterales DETECTED (A) NOT DETECTED   Enterobacter cloacae complex NOT DETECTED NOT DETECTED   Escherichia coli NOT DETECTED NOT DETECTED   Klebsiella aerogenes NOT DETECTED NOT DETECTED   Klebsiella oxytoca NOT DETECTED NOT DETECTED   Klebsiella pneumoniae DETECTED (A) NOT DETECTED   Proteus species DETECTED (A) NOT DETECTED   Salmonella species NOT DETECTED NOT DETECTED   Serratia marcescens NOT DETECTED  NOT DETECTED   Haemophilus influenzae NOT DETECTED NOT DETECTED   Neisseria meningitidis NOT DETECTED NOT DETECTED   Pseudomonas aeruginosa NOT DETECTED NOT DETECTED   Stenotrophomonas maltophilia NOT DETECTED NOT DETECTED   Candida albicans NOT DETECTED NOT DETECTED   Candida auris NOT DETECTED NOT DETECTED   Candida glabrata NOT DETECTED NOT DETECTED   Candida krusei NOT DETECTED NOT DETECTED   Candida parapsilosis NOT DETECTED NOT DETECTED   Candida tropicalis NOT DETECTED NOT DETECTED   Cryptococcus neoformans/gattii NOT DETECTED NOT DETECTED   CTX-M ESBL NOT DETECTED NOT DETECTED   Carbapenem resistance IMP NOT DETECTED NOT DETECTED   Carbapenem resistance KPC NOT DETECTED NOT DETECTED   Carbapenem resistance NDM NOT DETECTED NOT DETECTED   Carbapenem resist OXA 48 LIKE NOT DETECTED NOT DETECTED   Carbapenem resistance VIM NOT DETECTED NOT DETECTED    Celestine Slovak, PharmD, BCPS, BCIDP Work Cell: [phone] 02/18/2024 10:18 AM

## 2024-02-18 NOTE — Assessment & Plan Note (Signed)
Physical therapy evaluation °

## 2024-02-18 NOTE — Assessment & Plan Note (Signed)
Respiratory status stable. 

## 2024-02-19 DIAGNOSIS — I2585 Chronic coronary microvascular dysfunction: Secondary | ICD-10-CM

## 2024-02-19 DIAGNOSIS — K7460 Unspecified cirrhosis of liver: Secondary | ICD-10-CM

## 2024-02-19 DIAGNOSIS — F332 Major depressive disorder, 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 psychotic features: Secondary | ICD-10-CM

## 2024-02-19 DIAGNOSIS — R7881 Bacteremia: Secondary | ICD-10-CM

## 2024-02-19 DIAGNOSIS — I10 Essential (primary) hypertension: Secondary | ICD-10-CM | POA: Diagnosis not present

## 2024-02-19 DIAGNOSIS — N390 Urinary tract infection, site not specified: Secondary | ICD-10-CM | POA: Diagnosis not present

## 2024-02-19 LAB — CBC WITH DIFFERENTIAL/PLATELET
Abs Immature Granulocytes: 0.01 10*3/uL (ref 0.00–0.07)
Basophils Absolute: 0 10*3/uL (ref 0.0–0.1)
Basophils Relative: 0 %
Eosinophils Absolute: 0 10*3/uL (ref 0.0–0.5)
Eosinophils Relative: 0 %
HCT: 25.7 % — ABNORMAL LOW (ref 39.0–52.0)
Hemoglobin: 8.5 g/dL — ABNORMAL LOW (ref 13.0–17.0)
Immature Granulocytes: 0 %
Lymphocytes Relative: 8 %
Lymphs Abs: 0.2 10*3/uL — ABNORMAL LOW (ref 0.7–4.0)
MCH: 30.8 pg (ref 26.0–34.0)
MCHC: 33.1 g/dL (ref 30.0–36.0)
MCV: 93.1 fL (ref 80.0–100.0)
Monocytes Absolute: 0.4 10*3/uL (ref 0.1–1.0)
Monocytes Relative: 12 %
Neutro Abs: 2.4 10*3/uL (ref 1.7–7.7)
Neutrophils Relative %: 80 %
Platelets: 48 10*3/uL — ABNORMAL LOW (ref 150–400)
RBC: 2.76 MIL/uL — ABNORMAL LOW (ref 4.22–5.81)
RDW: 15.6 % — ABNORMAL HIGH (ref 11.5–15.5)
WBC: 3 10*3/uL — ABNORMAL LOW (ref 4.0–10.5)
nRBC: 0 % (ref 0.0–0.2)

## 2024-02-19 LAB — GLUCOSE, CAPILLARY
Glucose-Capillary: 66 mg/dL — ABNORMAL LOW (ref 70–99)
Glucose-Capillary: 118 mg/dL — ABNORMAL HIGH (ref 70–99)
Glucose-Capillary: 117 mg/dL — ABNORMAL HIGH (ref 70–99)
Glucose-Capillary: 119 mg/dL — ABNORMAL HIGH (ref 70–99)

## 2024-02-19 LAB — COMPREHENSIVE METABOLIC PANEL WITH GFR
ALT: 23 U/L (ref 0–44)
AST: 53 U/L — ABNORMAL HIGH (ref 15–41)
Albumin: 2.1 g/dL — ABNORMAL LOW (ref 3.5–5.0)
Alkaline Phosphatase: 41 U/L (ref 38–126)
Anion gap: 6 (ref 5–15)
BUN: 30 mg/dL — ABNORMAL HIGH (ref 8–23)
CO2: 22 mmol/L (ref 22–32)
Calcium: 8.9 mg/dL (ref 8.9–10.3)
Chloride: 109 mmol/L (ref 98–111)
Creatinine, Ser: 1.7 mg/dL — ABNORMAL HIGH (ref 0.61–1.24)
GFR, Estimated: 42 mL/min — ABNORMAL LOW (ref >60)
Glucose, Bld: 101 mg/dL — ABNORMAL HIGH (ref 70–99)
Potassium: 3.8 mmol/L (ref 3.5–5.1)
Sodium: 137 mmol/L (ref 135–145)
Total Bilirubin: 1.2 mg/dL (ref 0.0–1.2)
Total Protein: 5.2 g/dL — ABNORMAL LOW (ref 6.5–8.1)

## 2024-02-19 MED ORDER — SODIUM CHLORIDE 0.9 % IV SOLN
1.0000 g | Freq: Two times a day (BID) | INTRAVENOUS | Status: DC
  Administered 2024-02-19 – 2024-02-20 (×2): 1 g via INTRAVENOUS
  Filled 2024-02-19 (×2): qty 20

## 2024-02-19 NOTE — Plan of Care (Signed)
 The patient continues to have an elevated Temp. Provider notified. No new orders.

## 2024-02-19 NOTE — Progress Notes (Signed)
 PROGRESS NOTE    [name]  FMW:[id number] DOB: [birth year] DOA: 02/17/2024 PCP: [facility]  Chief Complaint  Patient presents with   Altered Mental Status    Hospital Course:  [name] Pac with hepatitis C, hypertension, COPD, hyperlipidemia, non-insulin -dependent diabetes, depression, history of TIA, multiple prior admissions for hepatic encephalopathy, who presents after falling out of bed at his care home.  In the ED vitals were unremarkable initially but then had reported fever of 103.3.  Initially labs were unremarkable, no leukocytosis, procalcitonin 0.31, lactic acid 1.9, and UA was positive for large leukocytes.  And repeat lactic acid rose to 9.0.  Blood cultures were obtained.  Patient was started on 2 g ceftriaxone  and given 2 L LR.  He was admitted for further workup.  Subjective: Patient has had continued fever overnight and through the day today.  He also endorses some increased swelling in his arms.  He cannot report swelling in his lower extremities.      Objective: Vitals:   02/19/24 0300 02/19/24 0400 02/19/24 0431 02/19/24 0744  BP:  (!) 102/44 (!) 102/44 (!) 115/47  Pulse:  61 61 (!) 57  Resp:   18 20  Temp: (!) 101.9 F (38.8 C) (!) 100.6 F (38.1 C) (!) 100.6 F (38.1 C) 99 F (37.2 C)  TempSrc:      SpO2:  99% 98% 100%  Weight:      Height:        Intake/Output Summary (Last 24 hours) at 02/19/2024 1448 Last data filed at 02/18/2024 1749 Gross per 24 hour  Intake 100 ml  Output --  Net 100 ml   Filed Weights   02/17/24 2306  Weight: 111.9 kg    Examination: General exam: Appears calm and comfortable, NAD  Respiratory system: No work of breathing, symmetric chest wall expansion Cardiovascular system: S1 & S2 heard, RRR.  Gastrointestinal system: Abdomen is nondistended, soft and nontender.  Neuro: Alert and oriented. No focal neurological deficits. Extremities: Symmetric, upper extremity swelling. Skin: No rashes,  lesions Psychiatry: Demonstrates appropriate judgement and insight. Mood & affect appropriate for situation.   Assessment & Plan:  Principal Problem:   Septic shock (HCC) Active Problems:   Complicated UTI (urinary tract infection)   Liver cirrhosis (HCC)   CAD (coronary artery disease)   HTN (hypertension)   Thrombocytopenia (HCC)   Pure hypercholesterolemia   Hx of transient ischemic attack (TIA)   Protein-calorie malnutrition, severe (HCC)   Depression with anxiety   COPD (chronic obstructive pulmonary disease) (HCC)   Weakness   Major depressive disorder, 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 psychotic features (HCC)   Hyperammonemia (HCC)   Bacteremia    Sepsis secondary to UTI and bacteremia Leukopenia Lactic acidosis - Criteria: Fever, tachypnea, lactic acidosis.    Complicated UTI - Patient has chronic indwelling Foley catheter, this was changed 6/24 - Urine culture currently with multiple species  Bacteremia - Blood cultures currently growing Proteus and Klebsiella.  Sensitivities still pending.  Has been on ceftriaxone  but is persistently febrile, will broaden - Initial WBC 7.4, has downtrended to leukopenia 3.0 - Have consulted infectious disease.  Liver cirrhosis Hyperammonemia - Chronically on Xifaxan  and lactulose , continue  Thrombocytopenia - Secondary to cirrhosis as above - Continue to monitor closely  Hypertension - Continue Lasix  and spironolactone   Hypercholesterolemia - Continue statin  Protein calorie malnutrition, severe - RD consult  History of TIA - Continue aspirin  and statin  Depression and anxiety - Continue Seroquel   Weakness -  PT evals  COPD, not currently in exacerbation - Currently on room air.  Normocytic anemia - Hemoglobin currently 8.5, baseline appears to be closer to 10 - Iron studies likely unreliable given acute infection. - Trend CBC - Transfuse if under 7  AKI superimposed on CKD stage III AA - Baseline creatinine  1.3 - Currently elevated to 1.7.  BUN elevated as well.  Suspect patient is dry - Will hold lasix  and spironolactone  for now  Heart failure preserved EF - Follows outpatient with cardiology - On daily Lasix  and spironolactone .  Does appear to be volume overloaded and upper extremities today - TED hose in place, difficult to assess lower extremity edema.  DVT prophylaxis: SCDs, hold anticoagulation given thrombocytopenia.   Code Status: Limited: Do not attempt resuscitation (DNR) -DNR-LIMITED -Do Not Intubate/DNI  Disposition: Inpatient, currently necessitating IV antibiotic therapy.  May eventually discharge to SNF  Consultants:    Procedures:    Antimicrobials:  Anti-infectives (From admission, onward)    Start     Dose/Rate Route Frequency Ordered Stop   02/18/24 2000  cefTRIAXone  (ROCEPHIN ) 2 g in sodium chloride  0.9 % 100 mL IVPB  Status:  Discontinued        2 g 200 mL/hr over 30 Minutes Intravenous Every 24 hours 02/17/24 2217 02/18/24 1019   02/18/24 1400  cefTRIAXone  (ROCEPHIN ) 2 g in sodium chloride  0.9 % 100 mL IVPB        2 g 200 mL/hr over 30 Minutes Intravenous Every 24 hours 02/18/24 1019     02/17/24 2200  rifaximin  (XIFAXAN ) tablet 550 mg        550 mg Oral 2 times daily 02/17/24 2159     02/17/24 2130  cefTRIAXone  (ROCEPHIN ) 2 g in sodium chloride  0.9 % 100 mL IVPB        2 g 200 mL/hr over 30 Minutes Intravenous  Once 02/17/24 2127 02/17/24 2200       Data Reviewed: I have personally reviewed following labs and imaging studies CBC: Recent Labs  Lab 02/17/24 1929 02/18/24 0323 02/19/24 0936  WBC 7.2 6.4 3.0*  NEUTROABS 6.2  --  2.4  HGB 8.3* 8.6* 8.5*  HCT 25.1* 26.2* 25.7*  MCV 94.0 94.2 93.1  PLT 57* 51* 48*   Basic Metabolic Panel: Recent Labs  Lab 02/17/24 1851 02/18/24 0323 02/19/24 0936  NA 137 139 137  K 3.7 3.8 3.8  CL 110 111 109  CO2 19* 22 22  GLUCOSE 79 77 101*  BUN 19 21 30*  CREATININE 1.33* 1.45* 1.70*  CALCIUM  8.9 8.9  8.9  MG 1.3*  --   --    GFR: Estimated Creatinine Clearance: 44.8 mL/min (A) (by C-G formula based on SCr of 1.7 mg/dL (H)). Liver Function Tests: Recent Labs  Lab 02/17/24 1851 02/18/24 0323 02/19/24 0936  AST 37 43* 53*  ALT 19 21 23   ALKPHOS 51 48 41  BILITOT 2.5* 2.5* 1.2  PROT 5.8* 5.8* 5.2*  ALBUMIN  2.4* 2.5* 2.1*   CBG: Recent Labs  Lab 02/18/24 1150 02/18/24 1616 02/18/24 2002 02/19/24 0738 02/19/24 1235  GLUCAP 70 89 76 66* 118*    Recent Results (from the past 240 hours)  Blood Culture (routine x 2)     Status: Abnormal (Preliminary result)   Collection Time: 02/17/24  6:51 PM   Specimen: BLOOD LEFT HAND  Result Value Ref Range Status   Specimen Description   Final    BLOOD LEFT HAND Performed at [facility], [address] [place], [place] [address]    Special Requests   Final    BOTTLES DRAWN AEROBIC AND ANAEROBIC Blood Culture results may not be optimal due to an inadequate volume of blood received in culture bottles Performed at [facility], [address] [place], [place] [address]    Culture  Setup Time   Final    GRAM NEGATIVE RODS AEROBIC BOTTLE ONLY CRITICAL VALUE NOTED.  VALUE IS CONSISTENT WITH PREVIOUSLY REPORTED AND CALLED VALUE. Performed at [facility], [address] [place], [place] [address]    Culture PROTEUS MIRABILIS (A)  Final   Report Status PENDING  Incomplete  Blood Culture (routine x 2)     Status: Abnormal (Preliminary result)   Collection Time: 02/17/24  6:54 PM   Specimen: BLOOD  Result Value Ref Range Status   Specimen Description   Final    BLOOD LEFT ANTECUBITAL Performed at [facility], [address], [place], [place] [address]    Special Requests   Final    BOTTLES DRAWN AEROBIC AND ANAEROBIC Blood Culture adequate volume Performed at [facility], [address] [place], [place] [address]    Culture  Setup Time   Final    GRAM NEGATIVE RODS ANAEROBIC  BOTTLE ONLY Organism ID to follow CRITICAL RESULT CALLED TO, READ BACK BY AND VERIFIED WITH: IDOLINA PERCY PHARMD [id number] 02/18/24 HNM GRAM STAIN REVIEWED-AGREE WITH RESULT DRT    Culture (A)  Final    PROTEUS MIRABILIS SUSCEPTIBILITIES TO FOLLOW KLEBSIELLA PNEUMONIAE CULTURE REINCUBATED FOR BETTER GROWTH Performed at [facility], [address], [place], [place] [address]    Report Status PENDING  Incomplete  Blood Culture ID Panel (Reflexed)     Status: Abnormal   Collection Time: 02/17/24  6:54 PM  Result Value Ref Range Status   Enterococcus faecalis NOT DETECTED NOT DETECTED Final   Enterococcus Faecium NOT DETECTED NOT DETECTED Final   Listeria monocytogenes NOT DETECTED NOT DETECTED Final   Staphylococcus species NOT DETECTED NOT DETECTED Final   Staphylococcus aureus (BCID) NOT DETECTED NOT DETECTED Final   Staphylococcus epidermidis NOT DETECTED NOT DETECTED Final   Staphylococcus lugdunensis NOT DETECTED NOT DETECTED Final   Streptococcus species NOT DETECTED NOT DETECTED Final   Streptococcus agalactiae NOT DETECTED NOT DETECTED Final   Streptococcus pneumoniae NOT DETECTED NOT DETECTED Final   Streptococcus pyogenes NOT DETECTED NOT DETECTED Final   A.calcoaceticus-baumannii NOT DETECTED NOT DETECTED Final   Bacteroides fragilis NOT DETECTED NOT DETECTED Final   Enterobacterales DETECTED (A) NOT DETECTED Final    Comment: CRITICAL RESULT CALLED TO, READ BACK BY AND VERIFIED WITHBETHA IDOLINA PERCY PHARMD [id number] 02/18/24 HNM    Enterobacter cloacae complex NOT DETECTED NOT DETECTED Final   Escherichia coli NOT DETECTED NOT DETECTED Final   Klebsiella aerogenes NOT DETECTED NOT DETECTED Final   Klebsiella oxytoca NOT DETECTED NOT DETECTED Final   Klebsiella pneumoniae DETECTED (A) NOT DETECTED Final    Comment: CRITICAL RESULT CALLED TO, READ BACK BY AND VERIFIED WITHBETHA IDOLINA PERCY PHARMD [id number] 02/18/24 HNM    Proteus species DETECTED (A) NOT DETECTED Final    Comment:  CRITICAL RESULT CALLED TO, READ BACK BY AND VERIFIED WITH: IDOLINA PERCY PHARMD [id number] 02/18/24 HNM    Salmonella species NOT DETECTED NOT DETECTED Final   Serratia marcescens NOT DETECTED NOT DETECTED Final   Haemophilus influenzae NOT DETECTED NOT DETECTED Final   Neisseria meningitidis NOT DETECTED NOT DETECTED Final  Pseudomonas aeruginosa NOT DETECTED NOT DETECTED Final   Stenotrophomonas maltophilia NOT DETECTED NOT DETECTED Final   Candida albicans NOT DETECTED NOT DETECTED Final   Candida auris NOT DETECTED NOT DETECTED Final   Candida glabrata NOT DETECTED NOT DETECTED Final   Candida krusei NOT DETECTED NOT DETECTED Final   Candida parapsilosis NOT DETECTED NOT DETECTED Final   Candida tropicalis NOT DETECTED NOT DETECTED Final   Cryptococcus neoformans/gattii NOT DETECTED NOT DETECTED Final   CTX-M ESBL NOT DETECTED NOT DETECTED Final   Carbapenem resistance IMP NOT DETECTED NOT DETECTED Final   Carbapenem resistance KPC NOT DETECTED NOT DETECTED Final   Carbapenem resistance NDM NOT DETECTED NOT DETECTED Final   Carbapenem resist OXA 48 LIKE NOT DETECTED NOT DETECTED Final   Carbapenem resistance VIM NOT DETECTED NOT DETECTED Final    Comment: Performed at [facility], [address] [place], [place] [address]  Urine Culture     Status: Abnormal   Collection Time: 02/17/24  7:29 PM   Specimen: Urine, Random  Result Value Ref Range Status   Specimen Description   Final    URINE, RANDOM Performed at [facility], [address] [place], [place] [address]    Special Requests   Final    NONE Performed at [facility], [address] [place], [place] [address]    Culture MULTIPLE SPECIES PRESENT, SUGGEST RECOLLECTION (A)  Final   Report Status 02/18/2024 FINAL  Final     Radiology Studies: US  Abdomen Limited RUQ (LIVER/GB) Result Date: 02/18/2024 CLINICAL DATA:  Elevated bilirubin.  Prior cholecystectomy. EXAM: ULTRASOUND ABDOMEN LIMITED  RIGHT UPPER QUADRANT COMPARISON:  Ultrasound 11/06/2022.  CT 06/19/2023. FINDINGS: Gallbladder: Previous cholecystectomy Common bile duct: Diameter: Dilated common duct up to 20 mm. On prior CT scan this had measured 2.8 cm. Chronic process. Please correlate with history. Liver: Nodular echogenic heterogeneous liver consistent with known chronic liver disease. With this level of echogenicity evaluation for underlying mass lesion is limited and if needed follow-up contrast CT or MRI as clinically appropriate. Portal vein is patent on color Doppler imaging with normal direction of blood flow towards the liver. Other: None. IMPRESSION: Nodular heterogeneous liver. Previous cholecystectomy. Dilated common duct but this has been previously described. Please correlate with history. Electronically Signed   By: Ranell Bring M.D.   On: 02/18/2024 10:53   US  Venous Img Lower Bilateral (DVT) Result Date: 02/18/2024 CLINICAL DATA:  Bilateral leg swelling EXAM: Bilateral LOWER EXTREMITY VENOUS DOPPLER ULTRASOUND TECHNIQUE: Gray-scale sonography with compression, as well as color and duplex ultrasound, were performed to evaluate the deep venous system(s) from the level of the common femoral vein through the popliteal and proximal calf veins. COMPARISON:  Left lower extremity venous Doppler ultrasound 03/23/2023 FINDINGS: VENOUS Normal compressibility of the common femoral, superficial femoral, and popliteal veins, as well as the visualized calf veins. Visualized portions of profunda femoral vein and great saphenous vein unremarkable. No filling defects to suggest DVT on grayscale or color Doppler imaging. Doppler waveforms show normal direction of venous flow, normal respiratory plasticity and response to augmentation. Limited views of the contralateral common femoral vein are unremarkable. OTHER None. Limitations: none IMPRESSION: No DVT identified bilateral lower extremities. Mild subcutaneous edema identified below the knee  bilaterally Electronically Signed   By: Cordella Banner   On: 02/18/2024 09:33   DG Chest Port 1 View Result Date: 02/17/2024 CLINICAL DATA:  Questionable sepsis EXAM: PORTABLE CHEST 1 VIEW COMPARISON:  Chest x-ray 01/11/2024 FINDINGS:  The heart is mildly enlarged. There is no focal lung infiltrate, pleural effusion or pneumothorax. Cervical spinal fusion plate is present. The IMPRESSION: Mild cardiomegaly. No acute pulmonary process. Electronically Signed   By: Greig Pique M.D.   On: 02/17/2024 19:59    Scheduled Meds:  aspirin   81 mg Oral Daily   atorvastatin   40 mg Oral QHS   Chlorhexidine  Gluconate Cloth  6 each Topical Daily   feeding supplement  237 mL Oral BID BM   furosemide   40 mg Oral Daily   lactulose   20 g Oral TID   multivitamin with minerals  1 tablet Oral Daily   pantoprazole   40 mg Oral Daily   QUEtiapine   50 mg Oral QHS   rifaximin   550 mg Oral BID   spironolactone   25 mg Oral Daily   Continuous Infusions:  cefTRIAXone  (ROCEPHIN )  IV 2 g (02/19/24 1427)     LOS: 1 day  MDM: Patient is high risk for one or more organ failure.  They necessitate ongoing hospitalization for continued IV therapies and subsequent lab monitoring. Total time spent interpreting labs and vitals, reviewing the medical record, coordinating care amongst consultants and care team members, directly assessing and discussing care with the patient and/or family: 55 min  Kamren Heintzelman, DO [facility]  To contact the attending physician between 7A-7P please use Epic Chat. To contact the covering physician during after hours 7P-7A, please review Amion.  02/19/2024, 2:48 PM   *This document has been created with the assistance of dictation software. Please excuse typographical errors. *

## 2024-02-19 NOTE — Progress Notes (Addendum)
 Physical Therapy Treatment Patient Details Name: [name] MRN: [id number] DOB: [birth year] Today's Date: 02/19/2024   History of Present Illness Pt is a 73 year old male with history of hepatitis C, hypertension, COPD, hyperlipidemia, non-insulin -dependent diabetes type 2, depression, history of TIA, history of multiple admissions for hepatic encephalopathy, presents to the emergency department for chief concerns of falling out of bed at care home. MD assessment includes: septic shock, complicated UTI, liver cirrhosis, thrombocytopenia, severe protein-calorie malnutrition, and weakness.    PT Comments  Pt was pleasantly confused, but willing to participate during the session and put forth fair effort throughout. Pt required +2 max assist with all bed mobility tasks, per below. Pt demonstrated severe posterior lean while sitting EOB and required max assist to maintain balance. Pt ambulated 4 feet total forwards and backwards with RW at EOB before reporting fatigue and stated that pain in lower back was preventing him to continue. Pt reported no adverse symptoms during the session with SpO2 and HR WNL throughout on room air. Pt will benefit from continued PT services upon discharge to safely address deficits listed in patient problem list for decreased caregiver assistance and eventual return to PLOF.    If plan is discharge home, recommend the following: Two people to help with walking and/or transfers;A lot of help with bathing/dressing/bathroom;Assistance with cooking/housework;Direct supervision/assist for medications management;Supervision due to cognitive status;Assist for transportation;Help with stairs or ramp for entrance   Can travel by private vehicle     No  Equipment Recommendations  Other (comment) (TBD at next venue of care)    Recommendations for Other Services       Precautions / Restrictions Precautions Precautions: Fall Restrictions Weight Bearing Restrictions Per  Provider Order: No     Mobility  Bed Mobility Overal bed mobility: Needs Assistance Bed Mobility: Supine to Sit, Sit to Supine     Supine to sit: +2 for physical assistance, Max assist Sit to supine: +2 for physical assistance, Max assist   General bed mobility comments: Pt required heavy +2 max assist for BLE and trunk control to prevent severe posterior lean    Transfers Overall transfer level: Needs assistance Equipment used: Rolling walker (2 wheels) Transfers: Sit to/from Stand Sit to Stand: Mod assist, +2 safety/equipment, From elevated surface           General transfer comment: Pt required VC's for hand placement on RW when coming to standing from EOB    Ambulation/Gait Ambulation/Gait assistance: Min assist Gait Distance (Feet): 4 Feet Assistive device: Rolling walker (2 wheels) Gait Pattern/deviations: Step-to pattern, Trunk flexed, Shuffle Gait velocity: decreased     General Gait Details: Pt ambulated forwards and backwards for 4 feet total at EOB before reporting fatigue. Pt required occasional min A to guide RW when ambulating backwards   Stairs             Wheelchair Mobility     Tilt Bed    Modified Rankin (Stroke Patients Only)       Balance Overall balance assessment: Needs assistance Sitting-balance support: Feet supported Sitting balance-Leahy Scale: Poor Sitting balance - Comments: Pt demonstrated severe posterior lean in sitting and required physical assistance from posterior side to lean forward Postural control: Posterior lean Standing balance support: Bilateral upper extremity supported, During functional activity, Reliant on assistive device for balance Standing balance-Leahy Scale: Poor Standing balance comment: Pt heavily reliant on RW to maintain balance and flexed trunk over RW throughout ambulation  Communication Communication Communication: Impaired Factors Affecting  Communication: Hearing impaired  Cognition Arousal: Alert Behavior During Therapy: WFL for tasks assessed/performed   PT - Cognitive impairments: No family/caregiver present to determine baseline, Awareness, Initiation, Problem solving, Safety/Judgement                         Following commands: Impaired Following commands impaired: Follows one step commands inconsistently, Follows one step commands with increased time    Cueing Cueing Techniques: Verbal cues, Tactile cues, Visual cues  Exercises      General Comments        Pertinent Vitals/Pain Pain Assessment Pain Assessment: 0-10 Pain Score: 9  Pain Location: Low back Pain Descriptors / Indicators: Constant, Discomfort, Tightness Pain Intervention(s): Monitored during session    Home Living                          Prior Function            PT Goals (current goals can now be found in the care plan section) Progress towards PT goals: PT to reassess next treatment    Frequency    Min 2X/week      PT Plan      Co-evaluation              AM-PAC PT 6 Clicks Mobility   Outcome Measure  Help needed turning from your back to your side while in a flat bed without using bedrails?: Total Help needed moving from lying on your back to sitting on the side of a flat bed without using bedrails?: Total Help needed moving to and from a bed to a chair (including a wheelchair)?: Total Help needed standing up from a chair using your arms (e.g., wheelchair or bedside chair)?: A Lot Help needed to walk in hospital room?: Total Help needed climbing 3-5 steps with a railing? : Total 6 Click Score: 7    End of Session Equipment Utilized During Treatment: Gait belt Activity Tolerance: Patient limited by fatigue;Patient limited by pain Patient left: in bed;with call bell/phone within reach;with bed alarm set Nurse Communication: Mobility status PT Visit Diagnosis: Unsteadiness on feet  (R26.81);History of falling (Z91.81);Difficulty in walking, not elsewhere classified (R26.2);Muscle weakness (generalized) (M62.81)     Time: 8672-8650 PT Time Calculation (min) (ACUTE ONLY): 22 min  Charges:                            Leontine Ingles, SPT 02/19/24, 3:25 PM This entire session was performed under direct supervision and direction of a licensed therapist/therapist assistant. I have personally read, edited and approve of the note as written.  Carmin Deed, DPT

## 2024-02-19 NOTE — TOC Initial Note (Signed)
 Transition of Care [facility]) - Initial/Assessment Note    Patient Details  Name: [name] MRN: [id number] Date of Birth: 04-30-1951  Transition of [facility]) CM/SW Contact:    Drew SHAUNNA Cumming, LCSW Phone Number: 02/19/2024, 10:32 AM  Clinical Narrative:                  CSW met with pt bedside to discuss SNF recommendation. Pt lives at [facility] group home. Pt desires to return to group home though understands short term rehab recommendation and is agreeable to SNF prior to returning home. CSW explained SNF w/u process. Fl2 completed and bed requests sent in hub. CSW called and updated group home manager, [name].   Expected Discharge Plan: Skilled Nursing Facility Barriers to Discharge: SNF Pending bed offer, Insurance Authorization, Continued Medical Work up   Patient Goals and CMS Choice Patient states their goals for this hospitalization and ongoing recovery are:: rehab then return home          Expected Discharge Plan and Services   Discharge Planning Services: CM Consult   Living arrangements for the past 2 months: Group Home                                      Prior Living Arrangements/Services Living arrangements for the past 2 months: Group Home Lives with:: Facility Resident Patient language and need for interpreter reviewed:: Yes        Need for Family Participation in Patient Care: No (Comment) Care giver support system in place?: Yes (comment) Current home services: Home OT, Home PT Criminal Activity/Legal Involvement Pertinent to Current Situation/Hospitalization: No - Comment as needed  Activities of Daily Living   ADL Screening (condition at time of admission) Independently performs ADLs?: No Does the patient have a NEW difficulty with bathing/dressing/toileting/self-feeding that is expected to last >3 days?: No Does the patient have a NEW difficulty with getting in/out of bed, walking, or climbing stairs that is expected to last >3 days?:  No Does the patient have a NEW difficulty with communication that is expected to last >3 days?: No Is the patient deaf or have difficulty hearing?: No Does the patient have difficulty seeing, even when wearing glasses/contacts?: No Does the patient have difficulty concentrating, remembering, or making decisions?: Yes  Permission Sought/Granted   Permission granted to share information with : Yes, Verbal Permission Granted  Share Information with NAME: [name] (Other)  [phone] (Mobile)           Emotional Assessment Appearance:: Appears stated age Attitude/Demeanor/Rapport: Engaged Affect (typically observed): Appropriate Orientation: : Oriented to Self, Oriented to Place, Oriented to  Time, Oriented to Situation Alcohol / Substance Use: Not Applicable Psych Involvement: No (comment)  Admission diagnosis:  Complicated UTI (urinary tract infection) [N39.0] Bacteremia [R78.81] Patient Active Problem List   Diagnosis Date Noted   Bacteremia 02/18/2024   Complicated UTI (urinary tract infection) 02/17/2024   Chronic indwelling Foley catheter 01/03/2024   Hypomagnesemia 12/30/2023   Depression with anxiety 12/30/2023   Altered mental status 11/12/2023   Chronic kidney disease, stage 3a (HCC) 10/21/2023   Myocardial injury 10/21/2023   CKD stage 3a, GFR 45-59 ml/min (HCC) - baseline scr 1.3 10/01/2023   Hepatic encephalopathy (HCC) 09/30/2023   Liver cirrhosis (HCC) 09/30/2023   Essential hypertension 09/30/2023   Diabetes mellitus type 2, noninsulin dependent (HCC) 09/30/2023   CAD (coronary artery disease) 09/30/2023  Hyperlipidemia 09/30/2023   Hepatic encephalopathy (HCC) 08/01/2023   Hyperammonemia (HCC) 07/30/2023   Cervical spinal stenosis 07/01/2023   Cervical myelopathy (HCC) 06/21/2023   Cervical stenosis of spine 06/21/2023   Cervical radiculopathy 06/20/2023   E coli bacteremia 06/17/2023   UTI (urinary tract infection) 06/17/2023   Left leg cellulitis  03/23/2023   Leukocytosis 03/23/2023   Left leg pain 03/23/2023   Suicidal ideation 01/14/2023   Acute pain 01/14/2023   Adrenal insufficiency (HCC) 12/26/2022   Orthostatic hypotension 12/24/2022   Rib pain on right side 12/24/2022   Hepatitis B infection 12/24/2022   Myocardial ischemia 12/24/2022   Acute appendicitis 11/05/2022   Pancytopenia (HCC) 11/05/2022   Sinus bradycardia 11/05/2022   Acute metabolic encephalopathy 09/22/2022   Liver lesion 09/22/2022   Obesity (BMI 30-39.9) 09/22/2022   Acute hepatic encephalopathy (HCC) 09/22/2022   MDD (major depressive disorder), recurrent severe, 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   Major depressive disorder, 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 psychotic features (HCC) 09/15/2022   Protein-calorie malnutrition, severe (HCC) 08/31/2022   Hypotension 08/31/2022   Weakness    Hx of transient ischemic attack (TIA) 04/28/2022   Obesity    Chronic diastolic CHF (congestive heart failure) (HCC)    Depression    Septic shock (HCC) 03/28/2022   Bacteremia due to Klebsiella pneumoniae 03/28/2022   Hypoglycemia 03/28/2022   Syncope 03/24/2022   Hypokalemia 03/24/2022   Fever 03/24/2022   COPD (chronic obstructive pulmonary disease) (HCC)    Elevated troponin    Abnormal LFTs    History of hepatitis C    AKI (acute kidney injury) (HCC)    Pure hypercholesterolemia    Obesity, Class III, BMI 40-49.9 (morbid obesity) 04/18/2020   Thrombocytopenia (HCC) 04/17/2020   Leukopenia 04/17/2020   HTN (hypertension) 04/17/2018   Uncontrolled type 2 diabetes mellitus with hyperglycemia, without long-term current use of insulin  (HCC) 04/17/2018   Lymphedema 04/17/2018   Normocytic anemia 04/07/2018   Chest pain 03/20/2018   Unsheltered homelessness 12/16/2017   PCP:  [employer] [employer] Pharmacy:   [id number] [address]),  - [address] [place]) [place] [address] Phone:  [phone] Fax: [phone]  [facility] [address] [place] [address] Phone: [phone] Fax: [phone]  [facility] - [place], [place] - [place] [address] [place] [address] Phone: [phone] Fax: [phone]     Social Drivers of Health (SDOH) Social History: SDOH Screenings   Food Insecurity: No Food Insecurity (02/17/2024)  Housing: Low Risk  (02/18/2024)  Transportation Needs: No Transportation Needs (02/17/2024)  Utilities: Not At Risk (02/17/2024)  Alcohol Screen: Low Risk  (09/24/2022)  Depression (PHQ2-9): High Risk (04/26/2022)  Financial Resource Strain: High Risk (04/27/2022)  Physical Activity: Sufficiently Active (04/16/2018)  Social Connections: Socially Isolated (02/17/2024)  Stress: Stress Concern Present (04/16/2018)  Tobacco Use: High Risk (02/18/2024)   SDOH Interventions:     Readmission Risk Interventions    11/29/2023    5:03 PM 11/13/2023    2:21 PM 11/12/2023    4:00 PM  Readmission Risk Prevention Plan  Transportation Screening Not Complete  Not Complete  Medication Review Oceanographer) Not Complete Complete Not Complete  PCP or Specialist appointment within 3-5 days of discharge Not Complete Complete Complete  HRI or Home Care Consult Not Complete Complete   SW Recovery Care/Counseling Consult Not Complete Complete   Palliative Care Screening Not Applicable Complete  Skilled Nursing Facility Not Applicable Not Applicable

## 2024-02-19 NOTE — Consult Note (Signed)
 Infectious Disease     Reason for Consult:Bacteremia    Referring Physician: Dr Leesa Date of Admission:  02/17/2024   Principal Problem:   Septic shock (HCC) Active Problems:   HTN (hypertension)   Thrombocytopenia (HCC)   COPD (chronic obstructive pulmonary disease) (HCC)   Pure hypercholesterolemia   Hx of transient ischemic attack (TIA)   Weakness   Protein-calorie malnutrition, severe (HCC)   Major depressive disorder, 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 psychotic features (HCC)   Hyperammonemia (HCC)   Liver cirrhosis (HCC)   CAD (coronary artery disease)   Depression with anxiety   Complicated UTI (urinary tract infection)   Bacteremia   HPI: [name] is a 73 y.o. male with hepatitis C, hypertension, COPD, hyperlipidemia, non-insulin -dependent diabetes, depression, history of TIA, multiple prior admissions for hepatic encephalopathy, admitted after falling out of bed at his care home. In the ED vitals were unremarkable initially but then had fever of 103.3. Initially labs were unremarkable, no leukocytosis, procalcitonin 0.31, lactic acid 1.9, and UA was positive for large leukocytes. CXR neg, US  abd chronic dilation hep ducts but ow neg. US  LE neg  However  repeat lactic acid rose to 9.0.  Started on CTX but fevers persist. BCX + Kleb and Proteus from 6/23. UCx mutliple species. . Mild intiial increase t bili, ast but improving. Persistently febrile on CTX  Past Medical History:  Diagnosis Date   (HFpEF) heart failure with preserved ejection fraction (HCC)    a.) TTE 03/20/2018: EF 60-65%, no RWMAs, mild LAE, mild-mod MR, PASP 42; b.) TTE 03/31/2021: EF 55-60%, no RWMAs, mild LVH, mild LAE, AoV sclerosis without stenosis; c.) TTE 03/25/2022: EF 60-65%, no RWMAs, AoV sclerosis without stenosis; d.) TTE 12/24/2022: EF 60-65%, no RWMAs, mild LVH, mild LAE.   Anasarca    Anemia    Anxiety    Aortic atherosclerosis (HCC)    Arthritis    Asthma    Bacteremia due to Escherichia coli  05/2023   Bacteremia due to Klebsiella pneumoniae 2023   Bilateral carotid artery disease (HCC)    Bradycardia    CAD (coronary artery disease) 10/21/2006   a.) MV 10/21/2006: small reversible defect involving the apical  lateral wall c/w possible ischemia; b.) MV: 03/24/2018: no ischemia; c.) MV 10/04/2021: LAD calcifications   CAD (coronary artery disease)    Cervical radiculopathy    Chest pain    a.) MV 03/21/2018: no ischemia; b.) MV 10/04/2021: no ischemia   Chronic back pain    Chronic common bile duct dilatation    Cirrhosis (HCC)    a.) (+) hepatic lesion on CT; likely hepatocellular carcinoma; b.) CT evidence of associated portal hypertension; c.) (+) abdominopelvic anasarca   COPD (chronic obstructive pulmonary disease) (HCC)    DDD (degenerative disc disease), lumbar    Depression    Frequent falls    GERD (gastroesophageal reflux disease)    HBV (hepatitis B virus) infection    HCV (hepatitis C virus)    Hernia of anterior abdominal wall    History of 2019 novel coronavirus disease (COVID-19) 04/16/2020   Homelessness    a.) limited on placement as patient is listed on [place] sex offender listing for exploitation of a minor (possession of child pornography); incarcerated 12 years (released 11/2017)   Hyperlipidemia    Hypertension    Infestation by bed bug 02/2022   Malingering    Migraine    Nephrolithiasis    Obesity    Portal hypertension (HCC)  PTSD (post-traumatic stress disorder)    a.) brief training in the [employer] at age 31; someone was killed in front of him   Pulmonary HTN (HCC)    a.) multiple CT scans desmontrating dilated PA c/w pHTN; b.) TTE 03/20/2018: PASP 42 mmHg   Sleep apnea    a.) no nocturnal PAP therapy   Sleep apnea    Stenosis of cervical spine with myelopathy (HCC)    Suicidal ideations    T2DM (type 2 diabetes mellitus) (HCC)    Thrombocytopenia (HCC)    a.) related to hepatic cirrhosis; spleen normal in size   TIA (transient ischemic  attack)    TIA (transient ischemic attack) 09/30/2023   Past Surgical History:  Procedure Laterality Date   ANTERIOR CERVICAL DECOMP/DISCECTOMY FUSION N/A 07/01/2023   Procedure: ANTERIOR CERVICAL DISCECTOMY AND FUSION (FORGE);  Surgeon: Clois Fret, MD;  Location: [facility] ORS;  Service: Neurosurgery;  Laterality: N/A;  Keep as last case of the day per Dr Clois   ANTERIOR CERVICAL DISCECTOMY     APPENDECTOMY     CARDIAC CATHETERIZATION     CHOLECYSTECTOMY     EYE SURGERY     INNER EAR SURGERY     NOSE SURGERY     XI ROBOTIC LAPAROSCOPIC ASSISTED APPENDECTOMY N/A 11/05/2022   Procedure: XI ROBOTIC LAPAROSCOPIC ASSISTED APPENDECTOMY;  Surgeon: Rodolph Romano, MD;  Location: [facility] ORS;  Service: General;  Laterality: N/A;   Social History   Tobacco Use   Smoking status: Every Day    Types: Cigarettes   Smokeless tobacco: Never  Substance Use Topics   Alcohol use: Not Currently    Comment: unknown   Drug use: Not Currently   Family History  Problem Relation Age of Onset   Hypertension Mother    Heart disease Mother    Heart failure Maternal Grandmother     Allergies:  Allergies  Allergen Reactions   Bee Venom Anaphylaxis   Codeine  Itching    Only itching. Recently allergy tested at [facility] Pre-F Virus Vaccine Swelling   Meloxicam     Other Reaction(s): ?overdose of local given at dentist--had trouble breathing   Codeine     Novocain [Procaine]    Sulfa Antibiotics Other (See Comments)   Sulfa Antibiotics     hallucinations    Current antibiotics: Antibiotics Given (last 72 hours)     Date/Time Action Medication Dose Rate   02/17/24 2142 New Bag/Given   cefTRIAXone  (ROCEPHIN ) 2 g in sodium chloride  0.9 % 100 mL IVPB 2 g 200 mL/hr   02/17/24 2226 Given   rifaximin  (XIFAXAN ) tablet 550 mg 550 mg    02/18/24 1030 Given   rifaximin  (XIFAXAN ) tablet 550 mg 550 mg    02/18/24 1343 New Bag/Given   cefTRIAXone  (ROCEPHIN ) 2 g in sodium chloride  0.9 %  100 mL IVPB 2 g 200 mL/hr   02/18/24 2048 Given   rifaximin  (XIFAXAN ) tablet 550 mg 550 mg    02/19/24 0946 Given   rifaximin  (XIFAXAN ) tablet 550 mg 550 mg    02/19/24 1427 New Bag/Given   cefTRIAXone  (ROCEPHIN ) 2 g in sodium chloride  0.9 % 100 mL IVPB 2 g 200 mL/hr   02/19/24 1715 New Bag/Given   meropenem  (MERREM ) 1 g in sodium chloride  0.9 % 100 mL IVPB 1 g 200 mL/hr       MEDICATIONS:  aspirin   81 mg Oral Daily   atorvastatin   40 mg Oral QHS   Chlorhexidine  Gluconate Cloth  6  each Topical Daily   feeding supplement  237 mL Oral BID BM   lactulose   20 g Oral TID   multivitamin with minerals  1 tablet Oral Daily   pantoprazole   40 mg Oral Daily   QUEtiapine   50 mg Oral QHS   rifaximin   550 mg Oral BID    Review of Systems - 11 systems reviewed and negative per HPI   OBJECTIVE: Temp:  [98.9 F (37.2 C)-102 F (38.9 C)] 99.9 F (37.7 C) (06/25 1740) Pulse Rate:  [53-61] 56 (06/25 1740) Resp:  [18-20] 20 (06/25 1740) BP: (102-132)/(44-62) 117/62 (06/25 1740) SpO2:  [98 %-100 %] 100 % (06/25 1740) Physical Exam  Constitutional: He is oriented to person, place, and time. Chroncially ill appearing HENT: anicteric Mouth/Throat: Oropharynx is clear and moist. No oropharyngeal exudate.  Cardiovascular: Normal rate, regular rhythm and normal heart sounds. Exam reveals no gallop and no friction rub.  No murmur heard.  Pulmonary/Chest: Effort normal and breath sounds normal. No respiratory distress. He has no wheezes.  Abdominal: Soft. Bowel sounds are normal. He exhibits no distension. There is no tenderness. Foly in place  Lymphadenopathy:  He has no cervical adenopathy.  Neurological: He is alert and oriented to person, place, and time.  Skin: Skin is warm and dry. No rash noted. No erythema.  Psychiatric: He has a normal mood and affect. His behavior is normal.     LABS: Results for orders placed or performed during the hospital encounter of 02/17/24 (from the past 48  hours)  Lactic acid, plasma     Status: Abnormal   Collection Time: 02/17/24  8:48 PM  Result Value Ref Range   Lactic Acid, Venous >9.0 (HH) 0.5 - 1.9 mmol/L    Comment: CRITICAL RESULT CALLED TO, READ BACK BY AND VERIFIED WITH [facility] ANDERSON 02/17/24 2135 MU Performed at [facility], [address] [name], [place] [address]   Lactic acid, plasma     Status: Abnormal   Collection Time: 02/17/24 11:09 PM  Result Value Ref Range   Lactic Acid, Venous 2.4 (HH) 0.5 - 1.9 mmol/L    Comment: CRITICAL VALUE NOTED. VALUE IS CONSISTENT WITH PREVIOUSLY REPORTED/CALLED VALUE MJU Performed at [facility], [address] [place], [place] [address]   Glucose, capillary     Status: Abnormal   Collection Time: 02/17/24 11:12 PM  Result Value Ref Range   Glucose-Capillary 68 (L) 70 - 99 mg/dL    Comment: Glucose reference range applies only to samples taken after fasting for at least 8 hours.  Basic metabolic panel     Status: Abnormal   Collection Time: 02/18/24  3:23 AM  Result Value Ref Range   Sodium 139 135 - 145 mmol/L   Potassium 3.8 3.5 - 5.1 mmol/L   Chloride 111 98 - 111 mmol/L   CO2 22 22 - 32 mmol/L   Glucose, Bld 77 70 - 99 mg/dL    Comment: Glucose reference range applies only to samples taken after fasting for at least 8 hours.   BUN 21 8 - 23 mg/dL   Creatinine, Ser 8.54 (H) 0.61 - 1.24 mg/dL   Calcium  8.9 8.9 - 10.3 mg/dL   GFR, Estimated 51 (L) >60 mL/min    Comment: (NOTE) Calculated using the CKD-EPI Creatinine Equation (2021)    Anion gap 6 5 - 15    Comment: Performed at [facility], [address] [place], [place] [address]  CBC     Status:  Abnormal   Collection Time: 02/18/24  3:23 AM  Result Value Ref Range   WBC 6.4 4.0 - 10.5 K/uL   RBC 2.78 (L) 4.22 - 5.81 MIL/uL   Hemoglobin 8.6 (L) 13.0 - 17.0 g/dL   HCT 73.7 (L) 60.9 - 47.9 %   MCV 94.2 80.0 - 100.0 fL   MCH 30.9 26.0 - 34.0 pg   MCHC 32.8 30.0 - 36.0 g/dL   RDW 84.7  88.4 - 84.4 %   Platelets 51 (L) 150 - 400 K/uL    Comment: Immature Platelet Fraction may be clinically indicated, consider ordering this additional test [id number]    nRBC 0.0 0.0 - 0.2 %    Comment: Performed at [facility], [address] [place], [place] [address]  Hepatic function panel     Status: Abnormal   Collection Time: 02/18/24  3:23 AM  Result Value Ref Range   Total Protein 5.8 (L) 6.5 - 8.1 g/dL   Albumin  2.5 (L) 3.5 - 5.0 g/dL   AST 43 (H) 15 - 41 U/L   ALT 21 0 - 44 U/L   Alkaline Phosphatase 48 38 - 126 U/L   Total Bilirubin 2.5 (H) 0.0 - 1.2 mg/dL   Bilirubin, Direct 0.6 (H) 0.0 - 0.2 mg/dL   Indirect Bilirubin 1.9 (H) 0.3 - 0.9 mg/dL    Comment: Performed at [facility], [address] [place], [place] [address]  Glucose, capillary     Status: Abnormal   Collection Time: 02/18/24  7:35 AM  Result Value Ref Range   Glucose-Capillary 69 (L) 70 - 99 mg/dL    Comment: Glucose reference range applies only to samples taken after fasting for at least 8 hours.  Glucose, capillary     Status: None   Collection Time: 02/18/24 11:50 AM  Result Value Ref Range   Glucose-Capillary 70 70 - 99 mg/dL    Comment: Glucose reference range applies only to samples taken after fasting for at least 8 hours.  Glucose, capillary     Status: None   Collection Time: 02/18/24  4:16 PM  Result Value Ref Range   Glucose-Capillary 89 70 - 99 mg/dL    Comment: Glucose reference range applies only to samples taken after fasting for at least 8 hours.  Glucose, capillary     Status: None   Collection Time: 02/18/24  8:02 PM  Result Value Ref Range   Glucose-Capillary 76 70 - 99 mg/dL    Comment: Glucose reference range applies only to samples taken after fasting for at least 8 hours.  Glucose, capillary     Status: Abnormal   Collection Time: 02/19/24  7:38 AM  Result Value Ref Range   Glucose-Capillary 66 (L) 70 - 99 mg/dL    Comment: Glucose reference range  applies only to samples taken after fasting for at least 8 hours.  CBC with Differential/Platelet     Status: Abnormal   Collection Time: 02/19/24  9:36 AM  Result Value Ref Range   WBC 3.0 (L) 4.0 - 10.5 K/uL   RBC 2.76 (L) 4.22 - 5.81 MIL/uL   Hemoglobin 8.5 (L) 13.0 - 17.0 g/dL   HCT 74.2 (L) 60.9 - 47.9 %   MCV 93.1 80.0 - 100.0 fL   MCH 30.8 26.0 - 34.0 pg   MCHC 33.1 30.0 - 36.0 g/dL   RDW 84.3 (H) 88.4 - 84.4 %   Platelets 48 (L) 150 - 400 K/uL    Comment: Immature  Platelet Fraction may be clinically indicated, consider ordering this additional test [id number]    nRBC 0.0 0.0 - 0.2 %   Neutrophils Relative % 80 %   Neutro Abs 2.4 1.7 - 7.7 K/uL   Lymphocytes Relative 8 %   Lymphs Abs 0.2 (L) 0.7 - 4.0 K/uL   Monocytes Relative 12 %   Monocytes Absolute 0.4 0.1 - 1.0 K/uL   Eosinophils Relative 0 %   Eosinophils Absolute 0.0 0.0 - 0.5 K/uL   Basophils Relative 0 %   Basophils Absolute 0.0 0.0 - 0.1 K/uL   Immature Granulocytes 0 %   Abs Immature Granulocytes 0.01 0.00 - 0.07 K/uL    Comment: Performed at [facility], [address] [place], [place] [address]  Comprehensive metabolic panel with GFR     Status: Abnormal   Collection Time: 02/19/24  9:36 AM  Result Value Ref Range   Sodium 137 135 - 145 mmol/L   Potassium 3.8 3.5 - 5.1 mmol/L   Chloride 109 98 - 111 mmol/L   CO2 22 22 - 32 mmol/L   Glucose, Bld 101 (H) 70 - 99 mg/dL    Comment: Glucose reference range applies only to samples taken after fasting for at least 8 hours.   BUN 30 (H) 8 - 23 mg/dL   Creatinine, Ser 8.29 (H) 0.61 - 1.24 mg/dL   Calcium  8.9 8.9 - 10.3 mg/dL   Total Protein 5.2 (L) 6.5 - 8.1 g/dL   Albumin  2.1 (L) 3.5 - 5.0 g/dL   AST 53 (H) 15 - 41 U/L   ALT 23 0 - 44 U/L   Alkaline Phosphatase 41 38 - 126 U/L   Total Bilirubin 1.2 0.0 - 1.2 mg/dL   GFR, Estimated 42 (L) >60 mL/min    Comment: (NOTE) Calculated using the CKD-EPI Creatinine Equation (2021)    Anion gap 6 5 -  15    Comment: Performed at [facility], [address] [place], [place] [address]  Glucose, capillary     Status: Abnormal   Collection Time: 02/19/24 12:35 PM  Result Value Ref Range   Glucose-Capillary 118 (H) 70 - 99 mg/dL    Comment: Glucose reference range applies only to samples taken after fasting for at least 8 hours.  Glucose, capillary     Status: Abnormal   Collection Time: 02/19/24  4:36 PM  Result Value Ref Range   Glucose-Capillary 117 (H) 70 - 99 mg/dL    Comment: Glucose reference range applies only to samples taken after fasting for at least 8 hours.   No components found for: ESR, C REACTIVE PROTEIN MICRO: Recent Results (from the past 720 hours)  Blood Culture (routine x 2)     Status: Abnormal (Preliminary result)   Collection Time: 02/17/24  6:51 PM   Specimen: BLOOD LEFT HAND  Result Value Ref Range Status   Specimen Description   Final    BLOOD LEFT HAND Performed at [facility], [address], [place], [place] [address]    Special Requests   Final    BOTTLES DRAWN AEROBIC AND ANAEROBIC Blood Culture results may not be optimal due to an inadequate volume of blood received in culture bottles Performed at [facility], [address] [place], [place] [address]    Culture  Setup Time   Final    GRAM NEGATIVE RODS AEROBIC BOTTLE ONLY CRITICAL VALUE NOTED.  VALUE IS CONSISTENT WITH PREVIOUSLY REPORTED AND CALLED VALUE. Performed at  [facility], [address] [place], [place] [address]    Culture PROTEUS MIRABILIS (A)  Final   Report Status PENDING  Incomplete  Blood Culture (routine x 2)     Status: Abnormal (Preliminary result)   Collection Time: 02/17/24  6:54 PM   Specimen: BLOOD  Result Value Ref Range Status   Specimen Description   Final    BLOOD LEFT ANTECUBITAL Performed at [facility], [address], [place], [place] [address]    Special Requests   Final    BOTTLES DRAWN AEROBIC AND  ANAEROBIC Blood Culture adequate volume Performed at [facility], [address], [place], [place] [address]    Culture  Setup Time   Final    GRAM NEGATIVE RODS IN BOTH AEROBIC AND ANAEROBIC BOTTLES Organism ID to follow CRITICAL RESULT CALLED TO, READ BACK BY AND VERIFIED WITHBETHA IDOLINA PERCY PHARMD [id number] 02/18/24 HNM GRAM STAIN REVIEWED-AGREE WITH RESULT DRT Performed at [facility], [address] [place], [place] [address]    Culture (A)  Final    PROTEUS MIRABILIS SUSCEPTIBILITIES TO FOLLOW KLEBSIELLA PNEUMONIAE CULTURE REINCUBATED FOR BETTER GROWTH Performed at [facility], [address], [place], [place] [address]    Report Status PENDING  Incomplete  Blood Culture ID Panel (Reflexed)     Status: Abnormal   Collection Time: 02/17/24  6:54 PM  Result Value Ref Range Status   Enterococcus faecalis NOT DETECTED NOT DETECTED Final   Enterococcus Faecium NOT DETECTED NOT DETECTED Final   Listeria monocytogenes NOT DETECTED NOT DETECTED Final   Staphylococcus species NOT DETECTED NOT DETECTED Final   Staphylococcus aureus (BCID) NOT DETECTED NOT DETECTED Final   Staphylococcus epidermidis NOT DETECTED NOT DETECTED Final   Staphylococcus lugdunensis NOT DETECTED NOT DETECTED Final   Streptococcus species NOT DETECTED NOT DETECTED Final   Streptococcus agalactiae NOT DETECTED NOT DETECTED Final   Streptococcus pneumoniae NOT DETECTED NOT DETECTED Final   Streptococcus pyogenes NOT DETECTED NOT DETECTED Final   A.calcoaceticus-baumannii NOT DETECTED NOT DETECTED Final   Bacteroides fragilis NOT DETECTED NOT DETECTED Final   Enterobacterales DETECTED (A) NOT DETECTED Final    Comment: CRITICAL RESULT CALLED TO, READ BACK BY AND VERIFIED WITHBETHA IDOLINA PERCY PHARMD [id number] 02/18/24 HNM    Enterobacter cloacae complex NOT DETECTED NOT DETECTED Final   Escherichia coli NOT DETECTED NOT DETECTED Final   Klebsiella aerogenes NOT DETECTED NOT DETECTED Final    Klebsiella oxytoca NOT DETECTED NOT DETECTED Final   Klebsiella pneumoniae DETECTED (A) NOT DETECTED Final    Comment: CRITICAL RESULT CALLED TO, READ BACK BY AND VERIFIED WITHBETHA IDOLINA PERCY PHARMD [id number] 02/18/24 HNM    Proteus species DETECTED (A) NOT DETECTED Final    Comment: CRITICAL RESULT CALLED TO, READ BACK BY AND VERIFIED WITH: IDOLINA PERCY PHARMD [id number] 02/18/24 HNM    Salmonella species NOT DETECTED NOT DETECTED Final   Serratia marcescens NOT DETECTED NOT DETECTED Final   Haemophilus influenzae NOT DETECTED NOT DETECTED Final   Neisseria meningitidis NOT DETECTED NOT DETECTED Final   Pseudomonas aeruginosa NOT DETECTED NOT DETECTED Final   Stenotrophomonas maltophilia NOT DETECTED NOT DETECTED Final   Candida albicans NOT DETECTED NOT DETECTED Final   Candida auris NOT DETECTED NOT DETECTED Final   Candida glabrata NOT DETECTED NOT DETECTED Final   Candida krusei NOT DETECTED NOT DETECTED Final   Candida parapsilosis NOT DETECTED NOT DETECTED Final   Candida tropicalis NOT DETECTED NOT DETECTED Final   Cryptococcus  neoformans/gattii NOT DETECTED NOT DETECTED Final   CTX-M ESBL NOT DETECTED NOT DETECTED Final   Carbapenem resistance IMP NOT DETECTED NOT DETECTED Final   Carbapenem resistance KPC NOT DETECTED NOT DETECTED Final   Carbapenem resistance NDM NOT DETECTED NOT DETECTED Final   Carbapenem resist OXA 48 LIKE NOT DETECTED NOT DETECTED Final   Carbapenem resistance VIM NOT DETECTED NOT DETECTED Final    Comment: Performed at [facility], [address], [place], [place] [address]  Urine Culture     Status: Abnormal   Collection Time: 02/17/24  7:29 PM   Specimen: Urine, Random  Result Value Ref Range Status   Specimen Description   Final    URINE, RANDOM Performed at [facility], [address] [place], [place] [address]    Special Requests   Final    NONE Performed at [facility], [address], [place], [place] [address]     Culture MULTIPLE SPECIES PRESENT, SUGGEST RECOLLECTION (A)  Final   Report Status 02/18/2024 FINAL  Final    IMAGING: US  Abdomen Limited RUQ (LIVER/GB) Result Date: 02/18/2024 CLINICAL DATA:  Elevated bilirubin.  Prior cholecystectomy. EXAM: ULTRASOUND ABDOMEN LIMITED RIGHT UPPER QUADRANT COMPARISON:  Ultrasound 11/06/2022.  CT 06/19/2023. FINDINGS: Gallbladder: Previous cholecystectomy Common bile duct: Diameter: Dilated common duct up to 20 mm. On prior CT scan this had measured 2.8 cm. Chronic process. Please correlate with history. Liver: Nodular echogenic heterogeneous liver consistent with known chronic liver disease. With this level of echogenicity evaluation for underlying mass lesion is limited and if needed follow-up contrast CT or MRI as clinically appropriate. Portal vein is patent on color Doppler imaging with normal direction of blood flow towards the liver. Other: None. IMPRESSION: Nodular heterogeneous liver. Previous cholecystectomy. Dilated common duct but this has been previously described. Please correlate with history. Electronically Signed   By: Ranell Bring M.D.   On: 02/18/2024 10:53   US  Venous Img Lower Bilateral (DVT) Result Date: 02/18/2024 CLINICAL DATA:  Bilateral leg swelling EXAM: Bilateral LOWER EXTREMITY VENOUS DOPPLER ULTRASOUND TECHNIQUE: Gray-scale sonography with compression, as well as color and duplex ultrasound, were performed to evaluate the deep venous system(s) from the level of the common femoral vein through the popliteal and proximal calf veins. COMPARISON:  Left lower extremity venous Doppler ultrasound 03/23/2023 FINDINGS: VENOUS Normal compressibility of the common femoral, superficial femoral, and popliteal veins, as well as the visualized calf veins. Visualized portions of profunda femoral vein and great saphenous vein unremarkable. No filling defects to suggest DVT on grayscale or color Doppler imaging. Doppler waveforms show normal direction of venous  flow, normal respiratory plasticity and response to augmentation. Limited views of the contralateral common femoral vein are unremarkable. OTHER None. Limitations: none IMPRESSION: No DVT identified bilateral lower extremities. Mild subcutaneous edema identified below the knee bilaterally Electronically Signed   By: Cordella Banner   On: 02/18/2024 09:33   DG Chest Port 1 View Result Date: 02/17/2024 CLINICAL DATA:  Questionable sepsis EXAM: PORTABLE CHEST 1 VIEW COMPARISON:  Chest x-ray 01/11/2024 FINDINGS: The heart is mildly enlarged. There is no focal lung infiltrate, pleural effusion or pneumothorax. Cervical spinal fusion plate is present. The IMPRESSION: Mild cardiomegaly. No acute pulmonary process. Electronically Signed   By: Greig Pique M.D.   On: 02/17/2024 19:59    Assessment:   [name] is a 73 y.o. male with with hepatitis C, hypertension, COPD, hyperlipidemia, non-insulin -dependent diabetes, depression, history of TIA, multiple prior admissions for hepatic  encephalopathy, admitted after falling out of bed at his care home. In the ED vitals were unremarkable initially but then had fever of 103.3. Initially labs were unremarkable, no leukocytosis, procalcitonin 0.31, lactic acid 1.9, and UA was positive for large leukocytes. CXR neg, US  abd chronic dilation hep ducts but ow neg. US  LE neg  However  repeat lactic acid rose to 9.0.  Started on CTX but fevers persist. BCX + Kleb and Proteus from 6/23. UCx mutliple species. . Mild intiial increase t bili, ast but improving.   Recommendations Cont meropenem  pending senis If sensitive organism and still febrile consider CT scan  Thank you very much for allowing me to participate in the care of this patient. Please call with questions.   Alm SQUIBB. Epifanio, MD

## 2024-02-19 NOTE — [place] FL2 (Signed)
 [place]  MEDICAID [id number] LEVEL OF CARE FORM     IDENTIFICATION  Patient Name: [name] Birthdate: 1951-01-10 Sex: male Admission Date (Current Location): 02/17/2024  [facility] and [place] Number:  Producer, television/film/video and Address:  [facility], [address], [place], [place] [address]      Provider Number: [id number]  Attending Physician Name and Address:  Leesa Kast, DO  Relative Name and Phone Number:  Sowa,[name]  [phone] (Mobile    Current Level of Care: Hospital Recommended Level of Care: Skilled Nursing Facility Prior Approval Number:    Date Approved/Denied:   PASRR Number: [id number]  Discharge Plan: SNF    Current Diagnoses: Patient Active Problem List   Diagnosis Date Noted   Bacteremia 02/18/2024   Complicated UTI (urinary tract infection) 02/17/2024   Chronic indwelling Foley catheter 01/03/2024   Hypomagnesemia 12/30/2023   Depression with anxiety 12/30/2023   Altered mental status 11/12/2023   Chronic kidney disease, stage 3a (HCC) 10/21/2023   Myocardial injury 10/21/2023   CKD stage 3a, GFR 45-59 ml/min (HCC) - baseline scr 1.3 10/01/2023   Hepatic encephalopathy (HCC) 09/30/2023   Liver cirrhosis (HCC) 09/30/2023   Essential hypertension 09/30/2023   Diabetes mellitus type 2, noninsulin dependent (HCC) 09/30/2023   CAD (coronary artery disease) 09/30/2023   Hyperlipidemia 09/30/2023   Hepatic encephalopathy (HCC) 08/01/2023   Hyperammonemia (HCC) 07/30/2023   Cervical spinal stenosis 07/01/2023   Cervical myelopathy (HCC) 06/21/2023   Cervical stenosis of spine 06/21/2023   Cervical radiculopathy 06/20/2023   E coli bacteremia 06/17/2023   UTI (urinary tract infection) 06/17/2023   Left leg cellulitis 03/23/2023   Leukocytosis 03/23/2023   Left leg pain 03/23/2023   Suicidal ideation 01/14/2023   Acute pain 01/14/2023   Adrenal insufficiency (HCC) 12/26/2022   Orthostatic hypotension  12/24/2022   Rib pain on right side 12/24/2022   Hepatitis B infection 12/24/2022   Myocardial ischemia 12/24/2022   Acute appendicitis 11/05/2022   Pancytopenia (HCC) 11/05/2022   Sinus bradycardia 11/05/2022   Acute metabolic encephalopathy 09/22/2022   Liver lesion 09/22/2022   Obesity (BMI 30-39.9) 09/22/2022   Acute hepatic encephalopathy (HCC) 09/22/2022   MDD (major depressive disorder), recurrent severe, 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   Major depressive disorder, 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 psychotic features (HCC) 09/15/2022   Protein-calorie malnutrition, severe (HCC) 08/31/2022   Hypotension 08/31/2022   Weakness    Hx of transient ischemic attack (TIA) 04/28/2022   Obesity    Chronic diastolic CHF (congestive heart failure) (HCC)    Depression    Septic shock (HCC) 03/28/2022   Bacteremia due to Klebsiella pneumoniae 03/28/2022   Hypoglycemia 03/28/2022   Syncope 03/24/2022   Hypokalemia 03/24/2022   Fever 03/24/2022   COPD (chronic obstructive pulmonary disease) (HCC)    Elevated troponin    Abnormal LFTs    History of hepatitis C    AKI (acute kidney injury) (HCC)    Pure hypercholesterolemia    Obesity, Class III, BMI 40-49.9 (morbid obesity) 04/18/2020   Thrombocytopenia (HCC) 04/17/2020   Leukopenia 04/17/2020   HTN (hypertension) 04/17/2018   Uncontrolled type 2 diabetes mellitus with hyperglycemia, without long-term current use of insulin  (HCC) 04/17/2018   Lymphedema 04/17/2018   Normocytic anemia 04/07/2018   Chest pain 03/20/2018   Unsheltered homelessness 12/16/2017    Orientation RESPIRATION BLADDER Height & Weight     Self, Situation, Time, Place  Normal Indwelling catheter Weight: 246 lb 11.1 oz (111.9 kg) Height:  5' 6 (167.6 cm)  BEHAVIORAL SYMPTOMS/MOOD NEUROLOGICAL BOWEL NUTRITION STATUS      Incontinent Diet (see d/c summary)  AMBULATORY STATUS COMMUNICATION OF NEEDS Skin   Extensive Assist Verbally Normal                        Personal Care Assistance Level of Assistance  Bathing, Feeding, Dressing Bathing Assistance: Maximum assistance Feeding assistance: Independent Dressing Assistance: Maximum assistance     Functional Limitations Info  Sight, Hearing, Speech Sight Info: Adequate Hearing Info: Adequate Speech Info: Adequate    SPECIAL CARE FACTORS FREQUENCY  PT (By licensed PT), OT (By licensed OT)     PT Frequency: 5x/week OT Frequency: 5x/week            Contractures      Additional Factors Info  Code Status, Allergies Code Status Info: DNR-limited Allergies Info: bee venom, codeine , Rsv Mrna Pre-f Virus Vaccine, Meloxicam, sulfantibiotics, Novocain (procaine)           Current Medications (02/19/2024):  This is the current hospital active medication list Current Facility-Administered Medications  Medication Dose Route Frequency Provider Last Rate Last Admin   acetaminophen  (TYLENOL ) tablet 650 mg  650 mg Oral Q6H PRN Cox, Amy N, DO   650 mg at 02/19/24 0321   Or   acetaminophen  (TYLENOL ) suppository 650 mg  650 mg Rectal Q6H PRN Cox, Amy N, DO       aspirin  chewable tablet 81 mg  81 mg Oral Daily Cox, Amy N, DO   81 mg at 02/19/24 0946   atorvastatin  (LIPITOR) tablet 40 mg  40 mg Oral QHS Cox, Amy N, DO   40 mg at 02/18/24 2049   cefTRIAXone  (ROCEPHIN ) 2 g in sodium chloride  0.9 % 100 mL IVPB  2 g Intravenous Q24H Zeigler, Dustin G, RPH   Stopped at 02/18/24 1413   Chlorhexidine  Gluconate Cloth 2 % PADS 6 each  6 each Topical Daily Cleatus Delayne GAILS, MD   6 each at 02/18/24 1547   feeding supplement (ENSURE PLUS HIGH PROTEIN) liquid 237 mL  237 mL Oral BID BM Josette Ade, MD   237 mL at 02/19/24 0948   furosemide  (LASIX ) tablet 40 mg  40 mg Oral Daily Cox, Amy N, DO   40 mg at 02/19/24 9053   hydrALAZINE  (APRESOLINE ) injection 5 mg  5 mg Intravenous Q6H PRN Cox, Amy N, DO       ipratropium-albuterol  (DUONEB) 0.5-2.5 (3) MG/3ML nebulizer solution 3 mL  3 mL Nebulization Q6H PRN  Cox, Amy N, DO   3 mL at 02/19/24 9660   lactulose  (CHRONULAC ) 10 GM/15ML solution 20 g  20 g Oral TID Cox, Amy N, DO   20 g at 02/19/24 0946   multivitamin with minerals tablet 1 tablet  1 tablet Oral Daily Josette Ade, MD   1 tablet at 02/19/24 9053   nitroGLYCERIN  (NITROLINGUAL ) 0.4 MG/SPRAY spray 1 spray  1 spray Sublingual Q5 Min x 3 PRN Cox, Amy N, DO       ondansetron  (ZOFRAN ) tablet 4 mg  4 mg Oral Q6H PRN Cox, Amy N, DO       Or   ondansetron  (ZOFRAN ) injection 4 mg  4 mg Intravenous Q6H PRN Cox, Amy N, DO       pantoprazole  (PROTONIX ) EC tablet 40 mg  40 mg Oral Daily Cox, Amy N, DO   40 mg at 02/19/24 0946   QUEtiapine  (SEROQUEL ) tablet 50 mg  50 mg Oral QHS Cox, Amy N, DO   50 mg at 02/18/24 2048   rifaximin  (XIFAXAN ) tablet 550 mg  550 mg Oral BID Cox, Amy N, DO   550 mg at 02/19/24 0946   senna-docusate (Senokot-S) tablet 1 tablet  1 tablet Oral QHS PRN Cox, Amy N, DO       spironolactone  (ALDACTONE ) tablet 25 mg  25 mg Oral Daily Cox, Amy N, DO   25 mg at 02/19/24 9052     Discharge Medications: Please see discharge summary for a list of discharge medications.  Relevant Imaging Results:  Relevant Lab Results:   Additional Information SSN: [id number]  Luann SHAUNNA Cumming, LCSW

## 2024-02-20 DIAGNOSIS — K7460 Unspecified cirrhosis of liver: Secondary | ICD-10-CM | POA: Diagnosis not present

## 2024-02-20 DIAGNOSIS — N390 Urinary tract infection, site not specified: Secondary | ICD-10-CM | POA: Diagnosis not present

## 2024-02-20 DIAGNOSIS — I10 Essential (primary) hypertension: Secondary | ICD-10-CM | POA: Diagnosis not present

## 2024-02-20 DIAGNOSIS — I2585 Chronic coronary microvascular dysfunction: Secondary | ICD-10-CM | POA: Diagnosis not present

## 2024-02-20 LAB — CBC WITH DIFFERENTIAL/PLATELET
Abs Immature Granulocytes: 0.01 10*3/uL (ref 0.00–0.07)
Basophils Absolute: 0 10*3/uL (ref 0.0–0.1)
Basophils Relative: 0 %
Eosinophils Absolute: 0.1 10*3/uL (ref 0.0–0.5)
Eosinophils Relative: 4 %
HCT: 24.1 % — ABNORMAL LOW (ref 39.0–52.0)
Hemoglobin: 8 g/dL — ABNORMAL LOW (ref 13.0–17.0)
Immature Granulocytes: 0 %
Lymphocytes Relative: 13 %
Lymphs Abs: 0.3 10*3/uL — ABNORMAL LOW (ref 0.7–4.0)
MCH: 30.9 pg (ref 26.0–34.0)
MCHC: 33.2 g/dL (ref 30.0–36.0)
MCV: 93.1 fL (ref 80.0–100.0)
Monocytes Absolute: 0.4 10*3/uL (ref 0.1–1.0)
Monocytes Relative: 16 %
Neutro Abs: 1.6 10*3/uL — ABNORMAL LOW (ref 1.7–7.7)
Neutrophils Relative %: 67 %
Platelets: 50 10*3/uL — ABNORMAL LOW (ref 150–400)
RBC: 2.59 MIL/uL — ABNORMAL LOW (ref 4.22–5.81)
RDW: 15.2 % (ref 11.5–15.5)
WBC: 2.5 10*3/uL — ABNORMAL LOW (ref 4.0–10.5)
nRBC: 0 % (ref 0.0–0.2)

## 2024-02-20 LAB — COMPREHENSIVE METABOLIC PANEL WITH GFR
ALT: 26 U/L (ref 0–44)
AST: 54 U/L — ABNORMAL HIGH (ref 15–41)
Albumin: 1.8 g/dL — ABNORMAL LOW (ref 3.5–5.0)
Alkaline Phosphatase: 45 U/L (ref 38–126)
Anion gap: 5 (ref 5–15)
BUN: 29 mg/dL — ABNORMAL HIGH (ref 8–23)
CO2: 24 mmol/L (ref 22–32)
Calcium: 8.7 mg/dL — ABNORMAL LOW (ref 8.9–10.3)
Chloride: 107 mmol/L (ref 98–111)
Creatinine, Ser: 1.45 mg/dL — ABNORMAL HIGH (ref 0.61–1.24)
GFR, Estimated: 51 mL/min — ABNORMAL LOW (ref >60)
Glucose, Bld: 102 mg/dL — ABNORMAL HIGH (ref 70–99)
Potassium: 4 mmol/L (ref 3.5–5.1)
Sodium: 136 mmol/L (ref 135–145)
Total Bilirubin: 1.3 mg/dL — ABNORMAL HIGH (ref 0.0–1.2)
Total Protein: 4.8 g/dL — ABNORMAL LOW (ref 6.5–8.1)

## 2024-02-20 LAB — CULTURE, BLOOD (ROUTINE X 2)

## 2024-02-20 LAB — GLUCOSE, CAPILLARY
Glucose-Capillary: 101 mg/dL — ABNORMAL HIGH (ref 70–99)
Glucose-Capillary: 129 mg/dL — ABNORMAL HIGH (ref 70–99)
Glucose-Capillary: 109 mg/dL — ABNORMAL HIGH (ref 70–99)
Glucose-Capillary: 120 mg/dL — ABNORMAL HIGH (ref 70–99)

## 2024-02-20 LAB — HIV ANTIBODY (ROUTINE TESTING W REFLEX): HIV Screen 4th Generation wRfx: NONREACTIVE

## 2024-02-20 MED ORDER — GABAPENTIN 300 MG PO CAPS
300.0000 mg | ORAL_CAPSULE | Freq: Two times a day (BID) | ORAL | Status: DC
  Administered 2024-02-20 – 2024-02-22 (×4): 300 mg via ORAL
  Filled 2024-02-20 (×4): qty 1

## 2024-02-20 MED ORDER — SODIUM CHLORIDE 0.9 % IV SOLN
1.0000 g | Freq: Three times a day (TID) | INTRAVENOUS | Status: DC
  Administered 2024-02-20: 1 g via INTRAVENOUS
  Filled 2024-02-20 (×5): qty 20

## 2024-02-20 MED ORDER — CITALOPRAM HYDROBROMIDE 20 MG PO TABS
20.0000 mg | ORAL_TABLET | Freq: Every day | ORAL | Status: DC
  Administered 2024-02-20 – 2024-02-22 (×3): 20 mg via ORAL
  Filled 2024-02-20 (×3): qty 1

## 2024-02-20 NOTE — Progress Notes (Cosign Needed)
 Today, [name] was A&O x 3. He was alert to person, place, and situation, and was disoriented to time. He received his morning meds crushed with applesauce. Pt has been refusing meals but accepted his morning ensure. His antibiotic is currently running and he is resting in bed with his bed in low position and call light within reach.
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
## 2024-02-20 NOTE — Progress Notes (Signed)
 PHARMACY NOTE:  ANTIMICROBIAL RENAL DOSAGE ADJUSTMENT  Current antimicrobial regimen includes a mismatch between antimicrobial dosage and estimated renal function.  As per policy approved by the Pharmacy & Therapeutics and Medical Executive Committees, the antimicrobial dosage will be adjusted accordingly.  Current antimicrobial dosage:  Meropenem 1 gm IV q12h  Indication: bacteremia/complcated UTI  Renal Function:  Estimated Creatinine Clearance: 52.5 mL/min (A) (by C-G formula based on SCr of 1.45 mg/dL (H)).  Antimicrobial dosage has been changed to:  Meropenem 1 gm q8h  Additional comments:   Thank you for allowing pharmacy to be a part of this patient's care.  Allean Haas PharmD Clinical Pharmacist 02/20/2024
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
## 2024-02-20 NOTE — Plan of Care (Signed)
 The patient continues on IV antibiotic for UTI. PRN Tylenol  650mg  and PRN Nebulizer was given at bedtime for c/o back pain 10/10 and wheezing. The patient reported no relief with Tylenol  for back pain. The patient was turned and repositioned to help with the pain. Neb treatment was effective.

## 2024-02-20 NOTE — Plan of Care (Signed)

## 2024-02-20 NOTE — Progress Notes (Signed)
 PROGRESS NOTE    [name]  FMW:[id number] DOB: April 08, 1951 DOA: 02/17/2024 PCP: [facility]  Chief Complaint  Patient presents with   Altered Mental Status    Hospital Course:  [name] with hepatitis C cirrhosis, hypertension, COPD, hyperlipidemia, non-insulin -dependent diabetes, depression, history of TIA, multiple prior admissions for hepatic encephalopathy, who presents after falling out of bed at his care home.  In the ED vitals were unremarkable initially but then had reported fever of 103.3.  Initially labs were unremarkable, no leukocytosis, procalcitonin 0.31, lactic acid 1.9, and UA was positive for large leukocytes.  And repeat lactic acid rose to 9.0.  Blood cultures were obtained.  Patient was started on 2 g ceftriaxone  and given 2 L LR.  He was admitted for further workup.  Subjective: No acute events overnight.  Patient reports he is feeling much improved from a swelling standpoint today.  Objective: Vitals:   02/20/24 0406 02/20/24 0446 02/20/24 0805 02/20/24 1128  BP: (!) 117/48 (!) 113/56 (!) 103/54 (!) 103/52  Pulse: (!) 57 67 (!) 52 (!) 51  Resp: 18 18  18   Temp: 99 F (37.2 C) 99 F (37.2 C) 98.8 F (37.1 C) 99.3 F (37.4 C)  TempSrc: Oral Oral Oral   SpO2: 99% 100% 100% 100%  Weight:      Height:        Intake/Output Summary (Last 24 hours) at 02/20/2024 1329 Last data filed at 02/20/2024 1021 Gross per 24 hour  Intake 820 ml  Output 2025 ml  Net -1205 ml   Filed Weights   02/17/24 2306  Weight: 111.9 kg    Examination: General exam: Appears calm and comfortable, NAD  Respiratory system: No work of breathing, symmetric chest wall expansion Cardiovascular system: S1 & S2 heard, RRR.  Gastrointestinal system: Abdomen is nondistended, soft and nontender.  Neuro: Alert and oriented. No focal neurological deficits. Extremities: Symmetric, upper extremity swelling. Skin: No rashes, lesions Psychiatry: Demonstrates  appropriate judgement and insight. Mood & affect appropriate for situation.   Assessment & Plan:  Principal Problem:   Septic shock (HCC) Active Problems:   Complicated UTI (urinary tract infection)   Liver cirrhosis (HCC)   CAD (coronary artery disease)   HTN (hypertension)   Thrombocytopenia (HCC)   Pure hypercholesterolemia   Hx of transient ischemic attack (TIA)   Protein-calorie malnutrition, severe (HCC)   Depression with anxiety   COPD (chronic obstructive pulmonary disease) (HCC)   Weakness   Major depressive disorder, 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 psychotic features (HCC)   Hyperammonemia (HCC)   Bacteremia    Sepsis secondary to UTI and bacteremia Leukopenia Lactic acidosis - Criteria: Fever, tachypnea, lactic acidosis.    Complicated UTI due to foley catheter, present on arrival - Patient has chronic indwelling Foley catheter, this was changed 6/24 - Urine culture currently with multiple species  Bacteremia - Blood cultures currently growing Proteus and Klebsiella. - Was persistently febrile despite ceftriaxone .  Changed to Merrem on 6/25. - Consulted ID, appreciate recommendations - Sensitivities today showing sensitivity to ceftriaxone  - Initial WBC 7.4 though becoming increasingly leukopenic.  Now down to 2.5. - HIV ordered and pending - Fever curve is improving over the last 24 hours  Liver cirrhosis Hyperammonemia - Chronically on Xifaxan  and lactulose , continue - Appears to be at his mental status baseline for now  Leukopenia  - Likely complicated by liver cirrhosis and acute infection.  HIV ordered.  Pending  Thrombocytopenia - Secondary to cirrhosis as above -,  No active signs of bleeding.  Hold anticoagulation  Hypertension - Stable.  Presently holding diuretics due to AKI  Hypercholesterolemia - Continue statin  History of TIA - Continue aspirin  and statin  Depression and anxiety - Continue Seroquel   Weakness - Continue PT/OT  COPD,  not currently in exacerbation - Currently on room air.  Normocytic anemia - Hemoglobin currently 8.0, baseline appears to be closer to 10 - Iron studies likely unreliable given acute infection. - Trend CBC - Transfuse if under 7  AKI superimposed on CKD stage III AA - Baseline creatinine 1.3 - Creatinine elevated to 1.7, BUN elevated.  Diuretics held.  Some improvement in creatinine today - Continue to trend CMP - Renally dose with a creatinine clearance of 52  Heart failure preserved EF - Follows outpatient with cardiology - On daily Lasix  and spironolactone .   - TED hose in place, difficult to assess lower extremity edema.  Obesity II, poa Severe PCM, ruled out - RD consult     DVT prophylaxis: SCDs, hold anticoagulation given thrombocytopenia.   Code Status: Limited: Do not attempt resuscitation (DNR) -DNR-LIMITED -Do Not Intubate/DNI  Disposition: Inpatient, currently necessitating IV antibiotic therapy.  May eventually discharge to SNF  Consultants:  Treatment Team:  Consulting Physician: Epifanio Alm SQUIBB, MD  Procedures:    Antimicrobials:  Anti-infectives (From admission, onward)    Start     Dose/Rate Route Frequency Ordered Stop   02/20/24 1800  meropenem (MERREM) 1 g in sodium chloride  0.9 % 100 mL IVPB        1 g 200 mL/hr over 30 Minutes Intravenous Every 8 hours 02/20/24 0927     02/19/24 1730  meropenem (MERREM) 1 g in sodium chloride  0.9 % 100 mL IVPB  Status:  Discontinued        1 g 200 mL/hr over 30 Minutes Intravenous Every 12 hours 02/19/24 1614 02/20/24 0927   02/18/24 2000  cefTRIAXone  (ROCEPHIN ) 2 g in sodium chloride  0.9 % 100 mL IVPB  Status:  Discontinued        2 g 200 mL/hr over 30 Minutes Intravenous Every 24 hours 02/17/24 2217 02/18/24 1019   02/18/24 1400  cefTRIAXone  (ROCEPHIN ) 2 g in sodium chloride  0.9 % 100 mL IVPB  Status:  Discontinued        2 g 200 mL/hr over 30 Minutes Intravenous Every 24 hours 02/18/24 1019 02/19/24  1614   02/17/24 2200  rifaximin  (XIFAXAN ) tablet 550 mg        550 mg Oral 2 times daily 02/17/24 2159     02/17/24 2130  cefTRIAXone  (ROCEPHIN ) 2 g in sodium chloride  0.9 % 100 mL IVPB        2 g 200 mL/hr over 30 Minutes Intravenous  Once 02/17/24 2127 02/17/24 2200       Data Reviewed: I have personally reviewed following labs and imaging studies CBC: Recent Labs  Lab 02/17/24 1929 02/18/24 0323 02/19/24 0936 02/20/24 0812  WBC 7.2 6.4 3.0* 2.5*  NEUTROABS 6.2  --  2.4 1.6*  HGB 8.3* 8.6* 8.5* 8.0*  HCT 25.1* 26.2* 25.7* 24.1*  MCV 94.0 94.2 93.1 93.1  PLT 57* 51* 48* 50*   Basic Metabolic Panel: Recent Labs  Lab 02/17/24 1851 02/18/24 0323 02/19/24 0936 02/20/24 0812  NA 137 139 137 136  K 3.7 3.8 3.8 4.0  CL 110 111 109 107  CO2 19* 22 22 24   GLUCOSE 79 77 101* 102*  BUN 19 21 30* 29*  CREATININE 1.33* 1.45* 1.70* 1.45*  CALCIUM  8.9 8.9 8.9 8.7*  MG 1.3*  --   --   --    GFR: Estimated Creatinine Clearance: 52.5 mL/min (A) (by C-G formula based on SCr of 1.45 mg/dL (H)). Liver Function Tests: Recent Labs  Lab 02/17/24 1851 02/18/24 0323 02/19/24 0936 02/20/24 0812  AST 37 43* 53* 54*  ALT 19 21 23 26   ALKPHOS 51 48 41 45  BILITOT 2.5* 2.5* 1.2 1.3*  PROT 5.8* 5.8* 5.2* 4.8*  ALBUMIN  2.4* 2.5* 2.1* 1.8*   CBG: Recent Labs  Lab 02/19/24 1235 02/19/24 1636 02/19/24 2035 02/20/24 0809 02/20/24 1150  GLUCAP 118* 117* 119* 101* 129*    Recent Results (from the past 240 hours)  Blood Culture (routine x 2)     Status: Abnormal   Collection Time: 02/17/24  6:51 PM   Specimen: BLOOD LEFT HAND  Result Value Ref Range Status   Specimen Description   Final    BLOOD LEFT HAND Performed at [facility], [address] [place], [place] [address]    Special Requests   Final    BOTTLES DRAWN AEROBIC AND ANAEROBIC Blood Culture results may not be optimal due to an inadequate volume of blood received in culture bottles Performed at [facility], [address] [place], [place] [address]    Culture  Setup Time   Final    GRAM NEGATIVE RODS AEROBIC BOTTLE ONLY CRITICAL VALUE NOTED.  VALUE IS CONSISTENT WITH PREVIOUSLY REPORTED AND CALLED VALUE. Performed at [facility], [address] [place], [place] [address]    Culture (A)  Final    PROTEUS MIRABILIS SUSCEPTIBILITIES PERFORMED ON PREVIOUS CULTURE WITHIN THE LAST 5 DAYS. Performed at [facility], [address], [place], [place] [address]    Report Status 02/20/2024 FINAL  Final  Blood Culture (routine x 2)     Status: Abnormal (Preliminary result)   Collection Time: 02/17/24  6:54 PM   Specimen: BLOOD  Result Value Ref Range Status   Specimen Description   Final    BLOOD LEFT ANTECUBITAL Performed at [facility], [address] [place], [place] [address]    Special Requests   Final    BOTTLES DRAWN AEROBIC AND ANAEROBIC Blood Culture adequate volume Performed at [facility], [address], [place], [place] [address]    Culture  Setup Time   Final    GRAM NEGATIVE RODS IN BOTH AEROBIC AND ANAEROBIC BOTTLES Organism ID to follow CRITICAL RESULT CALLED TO, READ BACK BY AND VERIFIED WITHBETHA IDOLINA PERCY PHARMD [id number] 02/18/24 HNM GRAM STAIN REVIEWED-AGREE WITH RESULT DRT Performed at [facility], [address] [place], [place] [address]    Culture (A)  Final    PROTEUS MIRABILIS KLEBSIELLA PNEUMONIAE SUSCEPTIBILITIES TO FOLLOW Performed at [facility], [address], [place], [place] [address]    Report Status PENDING  Incomplete   Organism ID, Bacteria PROTEUS MIRABILIS  Final   Organism ID, Bacteria PROTEUS MIRABILIS  Final      Susceptibility   Proteus mirabilis - KIRBY BAUER*    CEFAZOLIN  INTERMEDIATE Intermediate    Proteus mirabilis - MIC*    AMPICILLIN <=2 SENSITIVE Sensitive     CEFEPIME  <=0.12 SENSITIVE Sensitive     CEFTAZIDIME <=1 SENSITIVE Sensitive     CEFTRIAXONE  <=0.25  SENSITIVE Sensitive     CIPROFLOXACIN <=0.25 SENSITIVE Sensitive     GENTAMICIN <=1 SENSITIVE Sensitive  IMIPENEM 2 SENSITIVE Sensitive     TRIMETH/SULFA <=20 SENSITIVE Sensitive     AMPICILLIN/SULBACTAM <=2 SENSITIVE Sensitive     PIP/TAZO <=4 SENSITIVE Sensitive ug/mL    * PROTEUS MIRABILIS    PROTEUS MIRABILIS  Blood Culture ID Panel (Reflexed)     Status: Abnormal   Collection Time: 02/17/24  6:54 PM  Result Value Ref Range Status   Enterococcus faecalis NOT DETECTED NOT DETECTED Final   Enterococcus Faecium NOT DETECTED NOT DETECTED Final   Listeria monocytogenes NOT DETECTED NOT DETECTED Final   Staphylococcus species NOT DETECTED NOT DETECTED Final   Staphylococcus aureus (BCID) NOT DETECTED NOT DETECTED Final   Staphylococcus epidermidis NOT DETECTED NOT DETECTED Final   Staphylococcus lugdunensis NOT DETECTED NOT DETECTED Final   Streptococcus species NOT DETECTED NOT DETECTED Final   Streptococcus agalactiae NOT DETECTED NOT DETECTED Final   Streptococcus pneumoniae NOT DETECTED NOT DETECTED Final   Streptococcus pyogenes NOT DETECTED NOT DETECTED Final   A.calcoaceticus-baumannii NOT DETECTED NOT DETECTED Final   Bacteroides fragilis NOT DETECTED NOT DETECTED Final   Enterobacterales DETECTED (A) NOT DETECTED Final    Comment: CRITICAL RESULT CALLED TO, READ BACK BY AND VERIFIED WITHBETHA IDOLINA PERCY PHARMD [id number] 02/18/24 HNM    Enterobacter cloacae complex NOT DETECTED NOT DETECTED Final   Escherichia coli NOT DETECTED NOT DETECTED Final   Klebsiella aerogenes NOT DETECTED NOT DETECTED Final   Klebsiella oxytoca NOT DETECTED NOT DETECTED Final   Klebsiella pneumoniae DETECTED (A) NOT DETECTED Final    Comment: CRITICAL RESULT CALLED TO, READ BACK BY AND VERIFIED WITHBETHA IDOLINA PERCY PHARMD [id number] 02/18/24 HNM    Proteus species DETECTED (A) NOT DETECTED Final    Comment: CRITICAL RESULT CALLED TO, READ BACK BY AND VERIFIED WITH: IDOLINA PERCY PHARMD [id number] 02/18/24 HNM     Salmonella species NOT DETECTED NOT DETECTED Final   Serratia marcescens NOT DETECTED NOT DETECTED Final   Haemophilus influenzae NOT DETECTED NOT DETECTED Final   Neisseria meningitidis NOT DETECTED NOT DETECTED Final   Pseudomonas aeruginosa NOT DETECTED NOT DETECTED Final   Stenotrophomonas maltophilia NOT DETECTED NOT DETECTED Final   Candida albicans NOT DETECTED NOT DETECTED Final   Candida auris NOT DETECTED NOT DETECTED Final   Candida glabrata NOT DETECTED NOT DETECTED Final   Candida krusei NOT DETECTED NOT DETECTED Final   Candida parapsilosis NOT DETECTED NOT DETECTED Final   Candida tropicalis NOT DETECTED NOT DETECTED Final   Cryptococcus neoformans/gattii NOT DETECTED NOT DETECTED Final   CTX-M ESBL NOT DETECTED NOT DETECTED Final   Carbapenem resistance IMP NOT DETECTED NOT DETECTED Final   Carbapenem resistance KPC NOT DETECTED NOT DETECTED Final   Carbapenem resistance NDM NOT DETECTED NOT DETECTED Final   Carbapenem resist OXA 48 LIKE NOT DETECTED NOT DETECTED Final   Carbapenem resistance VIM NOT DETECTED NOT DETECTED Final    Comment: Performed at [facility], [address] [place], [place] [address]  Urine Culture     Status: Abnormal   Collection Time: 02/17/24  7:29 PM   Specimen: Urine, Random  Result Value Ref Range Status   Specimen Description   Final    URINE, RANDOM Performed at [facility], [address], [place], [place] [address]    Special Requests   Final    NONE Performed at [facility], [address] [place], [place] [address]    Culture MULTIPLE SPECIES PRESENT, SUGGEST RECOLLECTION (A)  Final   Report Status 02/18/2024 FINAL  Final  Radiology Studies: No results found.   Scheduled Meds:  aspirin   81 mg Oral Daily   atorvastatin   40 mg Oral QHS   Chlorhexidine  Gluconate Cloth  6 each Topical Daily   feeding supplement  237 mL Oral BID BM   lactulose   20 g Oral TID   multivitamin with minerals   1 tablet Oral Daily   pantoprazole   40 mg Oral Daily   QUEtiapine   50 mg Oral QHS   rifaximin   550 mg Oral BID   Continuous Infusions:  meropenem (MERREM) IV       LOS: 2 days  MDM: Patient is high risk for one or more organ failure.  They necessitate ongoing hospitalization for continued IV therapies and subsequent lab monitoring. Total time spent interpreting labs and vitals, reviewing the medical record, coordinating care amongst consultants and care team members, directly assessing and discussing care with the patient and/or family: 55 min  Murrel Freet, DO [facility]  To contact the attending physician between 7A-7P please use Epic Chat. To contact the covering physician during after hours 7P-7A, please review Amion.  02/20/2024, 1:29 PM   *This document has been created with the assistance of dictation software. Please excuse typographical errors. *

## 2024-02-21 DIAGNOSIS — K7460 Unspecified cirrhosis of liver: Secondary | ICD-10-CM | POA: Diagnosis not present

## 2024-02-21 DIAGNOSIS — N390 Urinary tract infection, site not specified: Secondary | ICD-10-CM | POA: Diagnosis not present

## 2024-02-21 DIAGNOSIS — R531 Weakness: Secondary | ICD-10-CM | POA: Diagnosis not present

## 2024-02-21 DIAGNOSIS — F332 Major depressive disorder, 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 psychotic features: Secondary | ICD-10-CM | POA: Diagnosis not present

## 2024-02-21 LAB — GLUCOSE, CAPILLARY
Glucose-Capillary: 90 mg/dL (ref 70–99)
Glucose-Capillary: 137 mg/dL — ABNORMAL HIGH (ref 70–99)
Glucose-Capillary: 119 mg/dL — ABNORMAL HIGH (ref 70–99)

## 2024-02-21 LAB — CULTURE, BLOOD (ROUTINE X 2): Special Requests: ADEQUATE

## 2024-02-21 MED ORDER — CIPROFLOXACIN HCL 500 MG PO TABS
500.0000 mg | ORAL_TABLET | Freq: Two times a day (BID) | ORAL | Status: DC
  Administered 2024-02-21 – 2024-02-22 (×3): 500 mg via ORAL
  Filled 2024-02-21 (×3): qty 1

## 2024-02-21 MED ORDER — CIPROFLOXACIN HCL 500 MG PO TABS: 500.0000 mg | ORAL_TABLET | Freq: Two times a day (BID) | ORAL | 0 refills | Status: AC

## 2024-02-21 NOTE — Progress Notes (Signed)
 Physical Therapy Treatment Patient Details Name: [name] MRN: [id number] DOB: Oct 14, 1950 Today's Date: 02/21/2024   History of Present Illness Pt is a 73 year old male with history of hepatitis C, hypertension, COPD, hyperlipidemia, non-insulin -dependent diabetes type 2, depression, history of TIA, history of multiple admissions for hepatic encephalopathy, presents to the emergency department for chief concerns of falling out of bed at care home. MD assessment includes: septic shock, complicated UTI, liver cirrhosis, thrombocytopenia, severe protein-calorie malnutrition, and weakness.    PT Comments  Pt required gentle encouragement along with education on physiological benefits of activity and time spent OOB to chair but ultimately put forth fair effort during the session.  Pt required no physical assistance with below functional tasks but did need frequent cuing for general sequencing most notably with gait.  Pt required extensive verbal cues during gait training for upright posture, amb closer to the RW, and to keep his feet inside the RW with pt frequently splitting a back walker leg with his feet despite cuing; BRW utilized in place of standard RW for second bout of amb with signficant improvement in general safety with pt agreeing that he felt more comfortable and safe with the wider RW due to his natural tendency for wide BOS during ambulation.  Pt reported no adverse symptoms during the session with SpO2 WNL and HR in the mid 50s throughout on room air.  Pt will benefit from continued PT services upon discharge to safely address deficits listed in patient problem list for decreased caregiver assistance and eventual return to PLOF.        If plan is discharge home, recommend the following: Assistance with cooking/housework;Direct supervision/assist for medications management;Supervision due to cognitive status;Assist for transportation;Help with stairs or ramp for entrance;A little help  with bathing/dressing/bathroom;A little help with walking and/or transfers   Can travel by private vehicle     Yes  Equipment Recommendations  Other (comment) (TBD at next venue of care)    Recommendations for Other Services       Precautions / Restrictions Precautions Precautions: Fall Restrictions Weight Bearing Restrictions Per Provider Order: No     Mobility  Bed Mobility Overal bed mobility: Needs Assistance       Supine to sit: Supervision     General bed mobility comments: Increased time, effort, use of bed rails, and general cuing for sequencing required    Transfers Overall transfer level: Needs assistance Equipment used: Rolling walker (2 wheels) Transfers: Sit to/from Stand Sit to Stand: Contact guard assist           General transfer comment: Mod verbal cues for hand placement with pt able to perform sit to/from stand transfers from various height surfaces with fair eccentric and concentric control and stability    Ambulation/Gait Ambulation/Gait assistance: Contact guard assist Gait Distance (Feet): 30 Feet Assistive device: Rolling walker (2 wheels) Gait Pattern/deviations: Step-through pattern, Decreased step length - right, Decreased step length - left, Shuffle, Trunk flexed Gait velocity: decreased     General Gait Details: Extensive verbal cues required for upright posture, amb closer to the RW, and to keep his feet inside the RW with pt frequently splitting a back walker leg with his feet despite cuing; BRW utilized in place of standard RW for second bout of amb with signficant improvement in general safety   Stairs             Wheelchair Mobility     Tilt Bed    Modified Rankin (Stroke Patients Only)  Balance Overall balance assessment: Needs assistance Sitting-balance support: Feet supported Sitting balance-Leahy Scale: Good     Standing balance support: Bilateral upper extremity supported, During functional  activity, Reliant on assistive device for balance Standing balance-Leahy Scale: Fair                              Musician Factors Affecting Communication: Hearing impaired  Cognition Arousal: Alert Behavior During Therapy: WFL for tasks assessed/performed   PT - Cognitive impairments: No family/caregiver present to determine baseline, Awareness, Problem solving, Safety/Judgement                         Following commands: Intact Following commands impaired: Follows one step commands with increased time    Cueing Cueing Techniques: Verbal cues, Tactile cues, Visual cues  Exercises Other Exercises Other Exercises: Pt education provided on physiological benefits of activity and activity progression as well as time spent OOB in chair    General Comments        Pertinent Vitals/Pain Pain Assessment Pain Assessment: No/denies pain    Home Living                          Prior Function            PT Goals (current goals can now be found in the care plan section) Progress towards PT goals: Progressing toward goals    Frequency    Min 2X/week      PT Plan      Co-evaluation              AM-PAC PT 6 Clicks Mobility   Outcome Measure  Help needed turning from your back to your side while in a flat bed without using bedrails?: A Little Help needed moving from lying on your back to sitting on the side of a flat bed without using bedrails?: A Little Help needed moving to and from a bed to a chair (including a wheelchair)?: A Little Help needed standing up from a chair using your arms (e.g., wheelchair or bedside chair)?: A Little Help needed to walk in hospital room?: A Little Help needed climbing 3-5 steps with a railing? : A Lot 6 Click Score: 17    End of Session Equipment Utilized During Treatment: Gait belt Activity Tolerance: Patient tolerated treatment well Patient left: in chair;with call bell/phone  within reach;with chair alarm set Nurse Communication: Mobility status PT Visit Diagnosis: Unsteadiness on feet (R26.81);History of falling (Z91.81);Difficulty in walking, not elsewhere classified (R26.2);Muscle weakness (generalized) (M62.81)     Time: 8450-8385 PT Time Calculation (min) (ACUTE ONLY): 25 min  Charges:    $Gait Training: 8-22 mins $Therapeutic Activity: 8-22 mins PT General Charges $$ ACUTE PT VISIT: 1 Visit                     D. Glendia Bertin PT, DPT 02/21/24, 4:34 PM

## 2024-02-21 NOTE — Plan of Care (Signed)

## 2024-02-21 NOTE — Care Management Important Message (Signed)
 Important Message  Patient Details  Name: [name] MRN: [id number] Date of Birth: [birth year]   Important Message Given:  Yes - Medicare IM     Diogenes Whirley W, CMA 02/21/2024, 12:18 PM

## 2024-02-21 NOTE — TOC Progression Note (Signed)
 Transition of Care [facility]) - Progression Note    Patient Details  Name: [name] MRN: [id number] Date of Birth: [birth year]  Transition of Care [facility]) CM/SW Contact  Marinda Cooks, RN Phone Number: 02/21/2024, 12:58 PM  Clinical Narrative:    This CM arrived bedside introduced role to pt and discussed dc plan. Pt informed of bed offers and informed he was in agreement to dc to SNF for short term rehab and requested a facility close to [place]. This CM informed [facility] was in [place] and pt agreed accepted bed offer. This CM spoke with Liaison Tammy and confirmed bed.  Insurance auth pending. TOC will cont to follow dc planning/ care coordination and update as applicable.    Expected Discharge Plan: Skilled Nursing Facility Barriers to Discharge: SNF Pending bed offer, Insurance Authorization, Continued Medical Work up  Expected Discharge Plan and Services   Discharge Planning Services: CM Consult   Living arrangements for the past 2 months: Group Home                                       Social Determinants of Health (SDOH) Interventions SDOH Screenings   Food Insecurity: No Food Insecurity (02/17/2024)  Housing: Low Risk  (02/18/2024)  Transportation Needs: No Transportation Needs (02/17/2024)  Utilities: Not At Risk (02/17/2024)  Alcohol Screen: Low Risk  (09/24/2022)  Depression (PHQ2-9): High Risk (04/26/2022)  Financial Resource Strain: High Risk (04/27/2022)  Physical Activity: Sufficiently Active (04/16/2018)  Social Connections: Socially Isolated (02/17/2024)  Stress: Stress Concern Present (04/16/2018)  Tobacco Use: High Risk (02/18/2024)    Readmission Risk Interventions    11/29/2023    5:03 PM 11/13/2023    2:21 PM 11/12/2023    4:00 PM  Readmission Risk Prevention Plan  Transportation Screening Not Complete  Not Complete  Medication Review Oceanographer) Not Complete Complete Not Complete  PCP or Specialist appointment within 3-5 days of discharge Not  Complete Complete Complete  HRI or Home Care Consult Not Complete Complete   SW Recovery Care/Counseling Consult Not Complete Complete   Palliative Care Screening Not Applicable Complete   Skilled Nursing Facility Not Applicable Not Applicable

## 2024-02-21 NOTE — Progress Notes (Addendum)
 PROGRESS NOTE    [name]  FMW:[id number] DOB: [birth year] DOA: 02/17/2024 PCP: [facility]  Chief Complaint  Patient presents with   Altered Mental Status    Hospital Course:  TOWNES FUHS with hepatitis C cirrhosis, hypertension, COPD, hyperlipidemia, non-insulin -dependent diabetes, depression, history of TIA, multiple prior admissions for hepatic encephalopathy, who presents after falling out of bed at his care home.  In the ED vitals were unremarkable initially but then had reported fever of 103.3.  Initially labs were unremarkable, no leukocytosis, procalcitonin 0.31, lactic acid 1.9, and UA was positive for large leukocytes.  And repeat lactic acid rose to 9.0.  Blood cultures were obtained.  Patient was started on 2 g ceftriaxone  and given 2 L LR.  He was admitted for further workup.  Subjective: No acute events overnight.  Patient was prepared for discharge today to group home.  He has now changed his mind and would like to go to SNF.  TOC is working on these arrangements.  Objective: Vitals:   02/21/24 0421 02/21/24 0731 02/21/24 0900 02/21/24 1130  BP: 105/68 111/78  114/83  Pulse: 61 (!) 52  (!) 45  Resp: 20 16 16 16   Temp: 99.6 F (37.6 C) 98.6 F (37 C)  98.3 F (36.8 C)  TempSrc:      SpO2: 98% 99%  100%  Weight:      Height:        Intake/Output Summary (Last 24 hours) at 02/21/2024 1447 Last data filed at 02/21/2024 1300 Gross per 24 hour  Intake --  Output 2000 ml  Net -2000 ml   Filed Weights   02/17/24 2306  Weight: 111.9 kg    Examination: General exam: Appears calm and comfortable, NAD  Respiratory system: No work of breathing, symmetric chest wall expansion Cardiovascular system: S1 & S2 heard, RRR.  Gastrointestinal system: Abdomen is nondistended, soft and nontender.  Neuro: Alert and oriented. No focal neurological deficits. Extremities: Symmetric, upper extremity swelling. Skin: No rashes, lesions Psychiatry:  Demonstrates appropriate judgement and insight. Mood & affect appropriate for situation.   Assessment & Plan:  Principal Problem:   Septic shock (HCC) Active Problems:   Complicated UTI (urinary tract infection)   Liver cirrhosis (HCC)   CAD (coronary artery disease)   HTN (hypertension)   Thrombocytopenia (HCC)   Pure hypercholesterolemia   Hx of transient ischemic attack (TIA)   Protein-calorie malnutrition, severe (HCC)   Depression with anxiety   COPD (chronic obstructive pulmonary disease) (HCC)   Weakness   Major depressive disorder, 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 psychotic features (HCC)   Hyperammonemia (HCC)   Bacteremia    Sepsis secondary to UTI and bacteremia Leukopenia Lactic acidosis - Criteria: Fever, tachypnea, lactic acidosis.    Complicated UTI due to foley catheter, present on arrival - Patient has chronic indwelling Foley catheter, this was changed 6/24 - Urine culture currently with multiple species  Bacteremia - Blood cultures currently growing Proteus and Klebsiella. - Was persistently febrile despite ceftriaxone .  Changed to Merrem  on 6/25. -Have consulted with ID and pharmacy.  Given sensitivities today we will proceed with ciprofloxacin for an additional 6 days to complete course. - Initial WBC 7.4 though becoming increasingly leukopenic.  Now down to 2.5. -Fever curve has improved  Liver cirrhosis Hyperammonemia - Chronically on Xifaxan  and lactulose , continue - Appears to be at his mental status baseline for now  Leukopenia  - Likely complicated by liver cirrhosis and acute infection.  HIV negative  Thrombocytopenia - Secondary to cirrhosis as above -, No active signs of bleeding.  Hold anticoagulation  Hypertension - Stable.  Presently holding diuretics due to AKI - Will resume at discharge with outpatient follow-up  Hypercholesterolemia - Continue statin  History of TIA - Continue aspirin  and statin  Depression and anxiety -  Continue Seroquel   Weakness - Continue PT/OT  COPD, not currently in exacerbation - Currently on room air.  Normocytic anemia - Hemoglobin currently 8.0, baseline appears to be closer to 10 - Iron studies likely unreliable given acute infection. - Trend CBC - Transfuse if under 7  AKI superimposed on CKD stage III AA - Baseline creatinine 1.3 - Creatinine elevated to 1.7, BUN elevated.  Diuretics held.  Some improvement in creatinine - Repeat CMP in a.m. - Renally dose with a creatinine clearance of 52  Heart failure preserved EF - Follows outpatient with cardiology - On daily Lasix  and spironolactone .  Currently on hold given AKI.  No acute worsening of swelling.  Obesity II, poa Severe PCM, ruled out - RD consult     DVT prophylaxis: SCDs, hold anticoagulation given thrombocytopenia.   Code Status: Limited: Do not attempt resuscitation (DNR) -DNR-LIMITED -Do Not Intubate/DNI  Disposition: Initially discharged today to group home.  Patient has now changed his mind and would like to go to skilled nursing facility.  TOC working on arrangements  Consultants:  Treatment Team:  Consulting Physician: Epifanio Alm SQUIBB, MD  Procedures:    Antimicrobials:  Anti-infectives (From admission, onward)    Start     Dose/Rate Route Frequency Ordered Stop   02/21/24 1030  ciprofloxacin (CIPRO) tablet 500 mg        500 mg Oral 2 times daily 02/21/24 0935 02/27/24 0759   02/21/24 0000  ciprofloxacin (CIPRO) 500 MG tablet        500 mg Oral 2 times daily 02/21/24 1347 02/27/24 2359   02/20/24 1800  meropenem  (MERREM ) 1 g in sodium chloride  0.9 % 100 mL IVPB  Status:  Discontinued        1 g 200 mL/hr over 30 Minutes Intravenous Every 8 hours 02/20/24 0927 02/21/24 0939   02/19/24 1730  meropenem  (MERREM ) 1 g in sodium chloride  0.9 % 100 mL IVPB  Status:  Discontinued        1 g 200 mL/hr over 30 Minutes Intravenous Every 12 hours 02/19/24 1614 02/20/24 0927   02/18/24 2000   cefTRIAXone  (ROCEPHIN ) 2 g in sodium chloride  0.9 % 100 mL IVPB  Status:  Discontinued        2 g 200 mL/hr over 30 Minutes Intravenous Every 24 hours 02/17/24 2217 02/18/24 1019   02/18/24 1400  cefTRIAXone  (ROCEPHIN ) 2 g in sodium chloride  0.9 % 100 mL IVPB  Status:  Discontinued        2 g 200 mL/hr over 30 Minutes Intravenous Every 24 hours 02/18/24 1019 02/19/24 1614   02/17/24 2200  rifaximin  (XIFAXAN ) tablet 550 mg        550 mg Oral 2 times daily 02/17/24 2159     02/17/24 2130  cefTRIAXone  (ROCEPHIN ) 2 g in sodium chloride  0.9 % 100 mL IVPB        2 g 200 mL/hr over 30 Minutes Intravenous  Once 02/17/24 2127 02/17/24 2200       Data Reviewed: I have personally reviewed following labs and imaging studies CBC: Recent Labs  Lab 02/17/24 1929 02/18/24 0323 02/19/24 0936 02/20/24 0812  WBC 7.2 6.4 3.0*  2.5*  NEUTROABS 6.2  --  2.4 1.6*  HGB 8.3* 8.6* 8.5* 8.0*  HCT 25.1* 26.2* 25.7* 24.1*  MCV 94.0 94.2 93.1 93.1  PLT 57* 51* 48* 50*   Basic Metabolic Panel: Recent Labs  Lab 02/17/24 1851 02/18/24 0323 02/19/24 0936 02/20/24 0812  NA 137 139 137 136  K 3.7 3.8 3.8 4.0  CL 110 111 109 107  CO2 19* 22 22 24   GLUCOSE 79 77 101* 102*  BUN 19 21 30* 29*  CREATININE 1.33* 1.45* 1.70* 1.45*  CALCIUM  8.9 8.9 8.9 8.7*  MG 1.3*  --   --   --    GFR: Estimated Creatinine Clearance: 52.5 mL/min (A) (by C-G formula based on SCr of 1.45 mg/dL (H)). Liver Function Tests: Recent Labs  Lab 02/17/24 1851 02/18/24 0323 02/19/24 0936 02/20/24 0812  AST 37 43* 53* 54*  ALT 19 21 23 26   ALKPHOS 51 48 41 45  BILITOT 2.5* 2.5* 1.2 1.3*  PROT 5.8* 5.8* 5.2* 4.8*  ALBUMIN  2.4* 2.5* 2.1* 1.8*   CBG: Recent Labs  Lab 02/20/24 1150 02/20/24 1540 02/20/24 1934 02/21/24 0730 02/21/24 1132  GLUCAP 129* 109* 120* 90 137*    Recent Results (from the past 240 hours)  Blood Culture (routine x 2)     Status: Abnormal   Collection Time: 02/17/24  6:51 PM   Specimen: BLOOD  LEFT HAND  Result Value Ref Range Status   Specimen Description   Final    BLOOD LEFT HAND Performed at [facility], [address] [place], [place] [address]    Special Requests   Final    BOTTLES DRAWN AEROBIC AND ANAEROBIC Blood Culture results may not be optimal due to an inadequate volume of blood received in culture bottles Performed at [facility], [address] [place], [place] [address]    Culture  Setup Time   Final    GRAM NEGATIVE RODS AEROBIC BOTTLE ONLY CRITICAL VALUE NOTED.  VALUE IS CONSISTENT WITH PREVIOUSLY REPORTED AND CALLED VALUE. Performed at [facility], [address] [place], [place] [address]    Culture (A)  Final    PROTEUS MIRABILIS SUSCEPTIBILITIES PERFORMED ON PREVIOUS CULTURE WITHIN THE LAST 5 DAYS. Performed at [facility], [address], [place], [place] [address]    Report Status 02/20/2024 FINAL  Final  Blood Culture (routine x 2)     Status: Abnormal   Collection Time: 02/17/24  6:54 PM   Specimen: BLOOD  Result Value Ref Range Status   Specimen Description BLOOD LEFT ANTECUBITAL  Final   Special Requests   Final    BOTTLES DRAWN AEROBIC AND ANAEROBIC Blood Culture adequate volume   Culture  Setup Time   Final    GRAM NEGATIVE RODS IN BOTH AEROBIC AND ANAEROBIC BOTTLES Organism ID to follow CRITICAL RESULT CALLED TO, READ BACK BY AND VERIFIED WITH: IDOLINA PERCY PHARMD [id number] 02/18/24 HNM GRAM STAIN REVIEWED-AGREE WITH RESULT DRT    Culture PROTEUS MIRABILIS KLEBSIELLA PNEUMONIAE  (A)  Final   Report Status 02/21/2024 FINAL  Final   Organism ID, Bacteria PROTEUS MIRABILIS  Final   Organism ID, Bacteria PROTEUS MIRABILIS  Final   Organism ID, Bacteria KLEBSIELLA PNEUMONIAE  Final   Organism ID, Bacteria KLEBSIELLA PNEUMONIAE  Final      Susceptibility   Klebsiella pneumoniae - MIC*    AMPICILLIN >=32 RESISTANT Resistant     CEFEPIME  <=0.12 SENSITIVE Sensitive     CEFTAZIDIME <=  1 SENSITIVE Sensitive      CEFTRIAXONE  <=0.25 SENSITIVE Sensitive     CIPROFLOXACIN <=0.25 SENSITIVE Sensitive     GENTAMICIN <=1 SENSITIVE Sensitive     IMIPENEM <=0.25 SENSITIVE Sensitive     TRIMETH/SULFA <=20 SENSITIVE Sensitive     AMPICILLIN/SULBACTAM 4 SENSITIVE Sensitive     PIP/TAZO <=4 SENSITIVE Sensitive ug/mL   Klebsiella pneumoniae - KIRBY BAUER*    CEFAZOLIN  Value in next row Sensitive      SENSITIVEPerformed at [facility], [address] [place], [place] [address]    * KLEBSIELLA PNEUMONIAE    KLEBSIELLA PNEUMONIAE   Proteus mirabilis - KIRBY BAUER*    CEFAZOLIN  Value in next row Intermediate      SENSITIVEPerformed at [facility], [address] [place], [place] [address]   Proteus mirabilis - MIC*    AMPICILLIN Value in next row Sensitive      SENSITIVEPerformed at [facility], [address] [place], [place] [address]    CEFEPIME  Value in next row Sensitive      SENSITIVEPerformed at [facility], [address] [place], [place] [address]    CEFTAZIDIME Value in next row Sensitive      SENSITIVEPerformed at [facility], [address] [place], [place] [address]    CEFTRIAXONE  Value in next row Sensitive      SENSITIVEPerformed at [facility], [address] [place], [place] [address]    CIPROFLOXACIN Value in next row Sensitive      SENSITIVEPerformed at [facility], [address] [place], [place] [address]    GENTAMICIN Value in next row Sensitive      SENSITIVEPerformed at [facility], [address] [place], [place] [address]    IMIPENEM Value in next row Sensitive      SENSITIVEPerformed at [facility], [address], [place], [place] [address]    TRIMETH/SULFA Value in next row Sensitive      SENSITIVEPerformed at [facility], [address], [place], [place] [address]    AMPICILLIN/SULBACTAM Value in next row Sensitive      SENSITIVEPerformed at [facility], [address] [place], [place]  [address]    PIP/TAZO Value in next row Sensitive ug/mL     SENSITIVEPerformed at [facility], [address] [place], [place] [address]    * PROTEUS MIRABILIS    PROTEUS MIRABILIS  Blood Culture ID Panel (Reflexed)     Status: Abnormal   Collection Time: 02/17/24  6:54 PM  Result Value Ref Range Status   Enterococcus faecalis NOT DETECTED NOT DETECTED Final   Enterococcus Faecium NOT DETECTED NOT DETECTED Final   Listeria monocytogenes NOT DETECTED NOT DETECTED Final   Staphylococcus species NOT DETECTED NOT DETECTED Final   Staphylococcus aureus (BCID) NOT DETECTED NOT DETECTED Final   Staphylococcus epidermidis NOT DETECTED NOT DETECTED Final   Staphylococcus lugdunensis NOT DETECTED NOT DETECTED Final   Streptococcus species NOT DETECTED NOT DETECTED Final   Streptococcus agalactiae NOT DETECTED NOT DETECTED Final   Streptococcus pneumoniae NOT DETECTED NOT DETECTED Final   Streptococcus pyogenes NOT DETECTED NOT DETECTED Final   A.calcoaceticus-baumannii NOT DETECTED NOT DETECTED Final   Bacteroides fragilis NOT DETECTED NOT DETECTED Final   Enterobacterales DETECTED (A) NOT DETECTED Final    Comment: CRITICAL RESULT CALLED TO, READ BACK BY AND VERIFIED WITHBETHA IDOLINA PERCY PHARMD [id number] 02/18/24 HNM  Enterobacter cloacae complex NOT DETECTED NOT DETECTED Final   Escherichia coli NOT DETECTED NOT DETECTED Final   Klebsiella aerogenes NOT DETECTED NOT DETECTED Final   Klebsiella oxytoca NOT DETECTED NOT DETECTED Final   Klebsiella pneumoniae DETECTED (A) NOT DETECTED Final    Comment: CRITICAL RESULT CALLED TO, READ BACK BY AND VERIFIED WITHBETHA IDOLINA PERCY PHARMD [id number] 02/18/24 HNM    Proteus species DETECTED (A) NOT DETECTED Final    Comment: CRITICAL RESULT CALLED TO, READ BACK BY AND VERIFIED WITH: IDOLINA PERCY PHARMD [id number] 02/18/24 HNM    Salmonella species NOT DETECTED NOT DETECTED Final   Serratia marcescens NOT DETECTED NOT DETECTED Final   Haemophilus influenzae NOT  DETECTED NOT DETECTED Final   Neisseria meningitidis NOT DETECTED NOT DETECTED Final   Pseudomonas aeruginosa NOT DETECTED NOT DETECTED Final   Stenotrophomonas maltophilia NOT DETECTED NOT DETECTED Final   Candida albicans NOT DETECTED NOT DETECTED Final   Candida auris NOT DETECTED NOT DETECTED Final   Candida glabrata NOT DETECTED NOT DETECTED Final   Candida krusei NOT DETECTED NOT DETECTED Final   Candida parapsilosis NOT DETECTED NOT DETECTED Final   Candida tropicalis NOT DETECTED NOT DETECTED Final   Cryptococcus neoformans/gattii NOT DETECTED NOT DETECTED Final   CTX-M ESBL NOT DETECTED NOT DETECTED Final   Carbapenem resistance IMP NOT DETECTED NOT DETECTED Final   Carbapenem resistance KPC NOT DETECTED NOT DETECTED Final   Carbapenem resistance NDM NOT DETECTED NOT DETECTED Final   Carbapenem resist OXA 48 LIKE NOT DETECTED NOT DETECTED Final   Carbapenem resistance VIM NOT DETECTED NOT DETECTED Final    Comment: Performed at [facility], [address], [place], [place] [address]  Urine Culture     Status: Abnormal   Collection Time: 02/17/24  7:29 PM   Specimen: Urine, Random  Result Value Ref Range Status   Specimen Description   Final    URINE, RANDOM Performed at [facility], [address], [place], [place] [address]    Special Requests   Final    NONE Performed at [facility], [address] [place], [place] [address]    Culture MULTIPLE SPECIES PRESENT, SUGGEST RECOLLECTION (A)  Final   Report Status 02/18/2024 FINAL  Final     Radiology Studies: No results found.   Scheduled Meds:  aspirin   81 mg Oral Daily   atorvastatin   40 mg Oral QHS   Chlorhexidine  Gluconate Cloth  6 each Topical Daily   ciprofloxacin  500 mg Oral BID   citalopram   20 mg Oral Daily   feeding supplement  237 mL Oral BID BM   gabapentin   300 mg Oral BID   lactulose   20 g Oral TID   multivitamin with minerals  1 tablet Oral Daily   pantoprazole    40 mg Oral Daily   QUEtiapine   50 mg Oral QHS   rifaximin   550 mg Oral BID   Continuous Infusions:     LOS: 3 days  MDM: Patient is high risk for one or more organ failure.  They necessitate ongoing hospitalization for continued IV therapies and subsequent lab monitoring. Total time spent interpreting labs and vitals, reviewing the medical record, coordinating care amongst consultants and care team members, directly assessing and discussing care with the patient and/or family: 35 min  May Manrique, DO [facility]  To contact the attending physician between 7A-7P please use Epic Chat. To contact the covering physician during after hours 7P-7A, please review Amion.  02/21/2024,  2:47 PM   *This document has been created with the assistance of dictation software. Please excuse typographical errors. *

## 2024-02-22 DIAGNOSIS — R531 Weakness: Secondary | ICD-10-CM | POA: Diagnosis not present

## 2024-02-22 DIAGNOSIS — N390 Urinary tract infection, site not specified: Secondary | ICD-10-CM | POA: Diagnosis not present

## 2024-02-22 DIAGNOSIS — F332 Major depressive disorder, 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 psychotic features: Secondary | ICD-10-CM | POA: Diagnosis not present

## 2024-02-22 DIAGNOSIS — K7460 Unspecified cirrhosis of liver: Secondary | ICD-10-CM | POA: Diagnosis not present

## 2024-02-22 LAB — COMPREHENSIVE METABOLIC PANEL WITH GFR
ALT: 46 U/L — ABNORMAL HIGH (ref 0–44)
AST: 92 U/L — ABNORMAL HIGH (ref 15–41)
Albumin: 1.8 g/dL — ABNORMAL LOW (ref 3.5–5.0)
Alkaline Phosphatase: 60 U/L (ref 38–126)
Anion gap: 5 (ref 5–15)
BUN: 26 mg/dL — ABNORMAL HIGH (ref 8–23)
CO2: 23 mmol/L (ref 22–32)
Calcium: 9.3 mg/dL (ref 8.9–10.3)
Chloride: 109 mmol/L (ref 98–111)
Creatinine, Ser: 1.13 mg/dL (ref 0.61–1.24)
GFR, Estimated: >60 mL/min (ref >60)
Glucose, Bld: 88 mg/dL (ref 70–99)
Potassium: 4.4 mmol/L (ref 3.5–5.1)
Sodium: 137 mmol/L (ref 135–145)
Total Bilirubin: 1 mg/dL (ref 0.0–1.2)
Total Protein: 4.8 g/dL — ABNORMAL LOW (ref 6.5–8.1)

## 2024-02-22 LAB — CBC WITH DIFFERENTIAL/PLATELET
Abs Immature Granulocytes: 0.02 10*3/uL (ref 0.00–0.07)
Basophils Absolute: 0 10*3/uL (ref 0.0–0.1)
Basophils Relative: 1 %
Eosinophils Absolute: 0.2 10*3/uL (ref 0.0–0.5)
Eosinophils Relative: 7 %
HCT: 24.3 % — ABNORMAL LOW (ref 39.0–52.0)
Hemoglobin: 8.2 g/dL — ABNORMAL LOW (ref 13.0–17.0)
Immature Granulocytes: 1 %
Lymphocytes Relative: 21 %
Lymphs Abs: 0.7 10*3/uL (ref 0.7–4.0)
MCH: 31.3 pg (ref 26.0–34.0)
MCHC: 33.7 g/dL (ref 30.0–36.0)
MCV: 92.7 fL (ref 80.0–100.0)
Monocytes Absolute: 0.5 10*3/uL (ref 0.1–1.0)
Monocytes Relative: 17 %
Neutro Abs: 1.7 10*3/uL (ref 1.7–7.7)
Neutrophils Relative %: 53 %
Platelets: 61 10*3/uL — ABNORMAL LOW (ref 150–400)
RBC: 2.62 MIL/uL — ABNORMAL LOW (ref 4.22–5.81)
RDW: 15 % (ref 11.5–15.5)
WBC: 3.1 10*3/uL — ABNORMAL LOW (ref 4.0–10.5)
nRBC: 0 % (ref 0.0–0.2)

## 2024-02-22 LAB — MAGNESIUM: Magnesium: 1.6 mg/dL — ABNORMAL LOW (ref 1.7–2.4)

## 2024-02-22 LAB — GLUCOSE, CAPILLARY
Glucose-Capillary: 90 mg/dL (ref 70–99)
Glucose-Capillary: 144 mg/dL — ABNORMAL HIGH (ref 70–99)
Glucose-Capillary: 97 mg/dL (ref 70–99)

## 2024-02-22 LAB — PHOSPHORUS: Phosphorus: 2 mg/dL — ABNORMAL LOW (ref 2.5–4.6)

## 2024-02-22 MED ORDER — SPIRONOLACTONE 25 MG PO TABS
25.0000 mg | ORAL_TABLET | Freq: Every day | ORAL | Status: DC
  Administered 2024-02-22: 25 mg via ORAL
  Filled 2024-02-22: qty 1

## 2024-02-22 MED ORDER — FUROSEMIDE 40 MG PO TABS
40.0000 mg | ORAL_TABLET | Freq: Every day | ORAL | Status: DC
  Administered 2024-02-22: 40 mg via ORAL
  Filled 2024-02-22: qty 1

## 2024-02-22 NOTE — TOC Progression Note (Signed)
 Transition of Care [facility]) - Progression Note    Patient Details  Name: [name] MRN: [id number] Date of Birth: [birth year]  [facility] CM/SW Contact  Marinda Cooks, RN Phone Number: 02/22/2024, 12:56 PM  Clinical Narrative:    This CM updated by covering MD pt medically cleared to dc today back to [facility]F [facility]. This CM spoke with Owner [name] Later and was informed he can not pick up pt until tomm in the morning medical team updated . TOC will cont to follow dc planning / care coordination and update as applicable.   Expected Discharge Plan: Skilled Nursing Facility Barriers to Discharge: SNF Pending bed offer, Insurance Authorization, Continued Medical Work up  Expected Discharge Plan and Services   Discharge Planning Services: CM Consult   Living arrangements for the past 2 months: Group Home Expected Discharge Date: 02/21/24                                     Social Determinants of Health (SDOH) Interventions SDOH Screenings   Food Insecurity: No Food Insecurity (02/17/2024)  Housing: Low Risk  (02/18/2024)  Transportation Needs: No Transportation Needs (02/17/2024)  Utilities: Not At Risk (02/17/2024)  Alcohol Screen: Low Risk  (09/24/2022)  Depression (PHQ2-9): High Risk (04/26/2022)  Financial Resource Strain: High Risk (04/27/2022)  Physical Activity: Sufficiently Active (04/16/2018)  Social Connections: Socially Isolated (02/17/2024)  Stress: Stress Concern Present (04/16/2018)  Tobacco Use: High Risk (02/18/2024)    Readmission Risk Interventions    11/29/2023    5:03 PM 11/13/2023    2:21 PM 11/12/2023    4:00 PM  Readmission Risk Prevention Plan  Transportation Screening Not Complete  Not Complete  Medication Review Oceanographer) Not Complete Complete Not Complete  PCP or Specialist appointment within 3-5 days of discharge Not Complete Complete Complete  HRI or Home Care Consult Not Complete Complete   SW Recovery  Care/Counseling Consult Not Complete Complete   Palliative Care Screening Not Applicable Complete   Skilled Nursing Facility Not Applicable Not Applicable

## 2024-02-22 NOTE — Progress Notes (Signed)
 PROGRESS NOTE    [name]  FMW:[id number] DOB: [birth year] DOA: 02/17/2024 PCP: [facility]  Chief Complaint  Patient presents with   Altered Mental Status    Hospital Course:  TREVIAN HAYASHIDA with hepatitis C cirrhosis, hypertension, COPD, hyperlipidemia, non-insulin -dependent diabetes, depression, history of TIA, multiple prior admissions for hepatic encephalopathy, who presents after falling out of bed at his care home.  In the ED vitals were unremarkable initially but then had reported fever of 103.3.  Initially labs were unremarkable, no leukocytosis, procalcitonin 0.31, lactic acid 1.9, and UA was positive for large leukocytes.  And repeat lactic acid rose to 9.0.  Blood cultures were obtained.  Patient was started on 2 g ceftriaxone  and given 2 L LR.  He was admitted for further workup.  Subjective: No acute events overnight.  Patient does have some increase swelling in his scrotum today.  We have resumed diuretics. This morning patient has no complaints.  He is quite clear that he would like to discharge back to his group home and has no interest in returning to rehab.  Objective: Vitals:   02/22/24 0040 02/22/24 0345 02/22/24 0440 02/22/24 0822  BP: 114/64 129/86 (!) 121/57 (!) 104/51  Pulse: (!) 49 (!) 110 (!) 49 (!) 46  Resp:    18  Temp: 99.7 F (37.6 C) 99.8 F (37.7 C) 99.2 F (37.3 C) 97.9 F (36.6 C)  TempSrc:    Oral  SpO2: 98% 95% 99% 96%  Weight:      Height:        Intake/Output Summary (Last 24 hours) at 02/22/2024 1320 Last data filed at 02/22/2024 0440 Gross per 24 hour  Intake 240 ml  Output 1250 ml  Net -1010 ml   Filed Weights   02/17/24 2306  Weight: 111.9 kg    Examination: General exam: Appears calm and comfortable, NAD  Respiratory system: No work of breathing, symmetric chest wall expansion Cardiovascular system: S1 & S2 heard, RRR.  Gastrointestinal system: Abdomen is nondistended, soft and nontender.  Neuro:  Alert and oriented. No focal neurological deficits. Extremities: Symmetric, upper extremity swelling. Skin: No rashes, lesions Psychiatry: Demonstrates appropriate judgement and insight. Mood & affect appropriate for situation.   Assessment & Plan:  Principal Problem:   Septic shock (HCC) Active Problems:   Complicated UTI (urinary tract infection)   Liver cirrhosis (HCC)   CAD (coronary artery disease)   HTN (hypertension)   Thrombocytopenia (HCC)   Pure hypercholesterolemia   Hx of transient ischemic attack (TIA)   Protein-calorie malnutrition, severe (HCC)   Depression with anxiety   COPD (chronic obstructive pulmonary disease) (HCC)   Weakness   Major depressive disorder, 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 psychotic features (HCC)   Hyperammonemia (HCC)   Bacteremia    Sepsis secondary to UTI and bacteremia Leukopenia Lactic acidosis - Criteria: Fever, tachypnea, lactic acidosis.    Complicated UTI due to foley catheter, present on arrival - Patient has chronic indwelling Foley catheter, this was changed 6/24 - Urine culture currently with multiple species  Bacteremia - Blood cultures currently growing Proteus and Klebsiella. - Was persistently febrile despite ceftriaxone .  Changed to Merrem  on 6/25. -Have consulted with ID and pharmacy.  Given sensitivities today we will proceed with ciprofloxacin for an additional 6 days to complete course. - Initial WBC 7.4 though becoming increasingly leukopenic.  Now down to 2.5. -Fever curve has improved  Liver cirrhosis Hyperammonemia - Chronically on Xifaxan  and lactulose , continue - Appears to be  at his mental status baseline for now  Leukopenia  - Likely complicated by liver cirrhosis and acute infection.  HIV negative  Thrombocytopenia - Secondary to cirrhosis as above -, No active signs of bleeding.  Hold anticoagulation  Hypertension - Stable.  Presently holding diuretics due to AKI - Will resume at discharge with  outpatient follow-up  Hypercholesterolemia - Continue statin  History of TIA - Continue aspirin  and statin  Depression and anxiety - Continue Seroquel   Weakness - Continue PT/OT  COPD, not currently in exacerbation - Currently on room air.  Normocytic anemia - Hemoglobin currently 8.0, baseline appears to be closer to 10 - Iron studies likely unreliable given acute infection. - Trend CBC - Transfuse if under 7  AKI superimposed on CKD stage III AA - Baseline creatinine 1.3 - Creatinine elevated to 1.7, BUN elevated. - Diuretics were held, creatinine has resolved.  To resume today - Repeat CMP in a.m. - Renally dose with a creatinine clearance of 52  Heart failure preserved EF - Follows outpatient with cardiology - On daily Lasix  and spironolactone , were on hold.  Increased welling.  Have resumed now  Obesity II, poa Severe PCM, ruled out - RD consult     DVT prophylaxis: SCDs, hold anticoagulation given thrombocytopenia.   Code Status: Limited: Do not attempt resuscitation (DNR) -DNR-LIMITED -Do Not Intubate/DNI  Disposition: Initially discharged to group home on 6/27.  Patient then informed TOC that he wanted to go to rehab and thus discharge canceled.  On 6/28 patient has now confirmed he would like to discharge directly to his group home.  Unfortunately group home unable to pick the patient up until tomorrow.  He will discharge in a.m.  Consultants:  Treatment Team:  Consulting Physician: Epifanio Alm SQUIBB, MD  Procedures:    Antimicrobials:  Anti-infectives (From admission, onward)    Start     Dose/Rate Route Frequency Ordered Stop   02/21/24 1030  ciprofloxacin (CIPRO) tablet 500 mg        500 mg Oral 2 times daily 02/21/24 0935 02/27/24 0759   02/21/24 0000  ciprofloxacin (CIPRO) 500 MG tablet        500 mg Oral 2 times daily 02/21/24 1347 02/27/24 2359   02/20/24 1800  meropenem  (MERREM ) 1 g in sodium chloride  0.9 % 100 mL IVPB  Status:   Discontinued        1 g 200 mL/hr over 30 Minutes Intravenous Every 8 hours 02/20/24 0927 02/21/24 0939   02/19/24 1730  meropenem  (MERREM ) 1 g in sodium chloride  0.9 % 100 mL IVPB  Status:  Discontinued        1 g 200 mL/hr over 30 Minutes Intravenous Every 12 hours 02/19/24 1614 02/20/24 0927   02/18/24 2000  cefTRIAXone  (ROCEPHIN ) 2 g in sodium chloride  0.9 % 100 mL IVPB  Status:  Discontinued        2 g 200 mL/hr over 30 Minutes Intravenous Every 24 hours 02/17/24 2217 02/18/24 1019   02/18/24 1400  cefTRIAXone  (ROCEPHIN ) 2 g in sodium chloride  0.9 % 100 mL IVPB  Status:  Discontinued        2 g 200 mL/hr over 30 Minutes Intravenous Every 24 hours 02/18/24 1019 02/19/24 1614   02/17/24 2200  rifaximin  (XIFAXAN ) tablet 550 mg        550 mg Oral 2 times daily 02/17/24 2159     02/17/24 2130  cefTRIAXone  (ROCEPHIN ) 2 g in sodium chloride  0.9 % 100 mL IVPB  2 g 200 mL/hr over 30 Minutes Intravenous  Once 02/17/24 2127 02/17/24 2200       Data Reviewed: I have personally reviewed following labs and imaging studies CBC: Recent Labs  Lab 02/17/24 1929 02/18/24 0323 02/19/24 0936 02/20/24 0812 02/22/24 0539  WBC 7.2 6.4 3.0* 2.5* 3.1*  NEUTROABS 6.2  --  2.4 1.6* 1.7  HGB 8.3* 8.6* 8.5* 8.0* 8.2*  HCT 25.1* 26.2* 25.7* 24.1* 24.3*  MCV 94.0 94.2 93.1 93.1 92.7  PLT 57* 51* 48* 50* 61*   Basic Metabolic Panel: Recent Labs  Lab 02/17/24 1851 02/18/24 0323 02/19/24 0936 02/20/24 0812 02/22/24 0539  NA 137 139 137 136 137  K 3.7 3.8 3.8 4.0 4.4  CL 110 111 109 107 109  CO2 19* 22 22 24 23   GLUCOSE 79 77 101* 102* 88  BUN 19 21 30* 29* 26*  CREATININE 1.33* 1.45* 1.70* 1.45* 1.13  CALCIUM  8.9 8.9 8.9 8.7* 9.3  MG 1.3*  --   --   --  1.6*  PHOS  --   --   --   --  2.0*   GFR: Estimated Creatinine Clearance: 67.3 mL/min (by C-G formula based on SCr of 1.13 mg/dL). Liver Function Tests: Recent Labs  Lab 02/17/24 1851 02/18/24 0323 02/19/24 0936 02/20/24 0812  02/22/24 0539  AST 37 43* 53* 54* 92*  ALT 19 21 23 26  46*  ALKPHOS 51 48 41 45 60  BILITOT 2.5* 2.5* 1.2 1.3* 1.0  PROT 5.8* 5.8* 5.2* 4.8* 4.8*  ALBUMIN  2.4* 2.5* 2.1* 1.8* 1.8*   CBG: Recent Labs  Lab 02/21/24 0730 02/21/24 1132 02/21/24 1945 02/22/24 0758 02/22/24 1151  GLUCAP 90 137* 119* 90 144*    Recent Results (from the past 240 hours)  Blood Culture (routine x 2)     Status: Abnormal   Collection Time: 02/17/24  6:51 PM   Specimen: BLOOD LEFT HAND  Result Value Ref Range Status   Specimen Description   Final    BLOOD LEFT HAND Performed at [facility], [address] [place], [place] [address]    Special Requests   Final    BOTTLES DRAWN AEROBIC AND ANAEROBIC Blood Culture results may not be optimal due to an inadequate volume of blood received in culture bottles Performed at [facility], [address] [place], [place] [address]    Culture  Setup Time   Final    GRAM NEGATIVE RODS AEROBIC BOTTLE ONLY CRITICAL VALUE NOTED.  VALUE IS CONSISTENT WITH PREVIOUSLY REPORTED AND CALLED VALUE. Performed at [facility], [address] [place], [place] [address]    Culture (A)  Final    PROTEUS MIRABILIS SUSCEPTIBILITIES PERFORMED ON PREVIOUS CULTURE WITHIN THE LAST 5 DAYS. Performed at [facility], [address] [place], [place] [address]    Report Status 02/20/2024 FINAL  Final  Blood Culture (routine x 2)     Status: Abnormal   Collection Time: 02/17/24  6:54 PM   Specimen: BLOOD  Result Value Ref Range Status   Specimen Description BLOOD LEFT ANTECUBITAL  Final   Special Requests   Final    BOTTLES DRAWN AEROBIC AND ANAEROBIC Blood Culture adequate volume   Culture  Setup Time   Final    GRAM NEGATIVE RODS IN BOTH AEROBIC AND ANAEROBIC BOTTLES Organism ID to follow CRITICAL RESULT CALLED TO, READ BACK BY AND VERIFIED WITHBETHA IDOLINA PERCY PHARMD [id number] 02/18/24 HNM GRAM STAIN REVIEWED-AGREE WITH RESULT  DRT    Culture  PROTEUS MIRABILIS KLEBSIELLA PNEUMONIAE  (A)  Final   Report Status 02/21/2024 FINAL  Final   Organism ID, Bacteria PROTEUS MIRABILIS  Final   Organism ID, Bacteria PROTEUS MIRABILIS  Final   Organism ID, Bacteria KLEBSIELLA PNEUMONIAE  Final   Organism ID, Bacteria KLEBSIELLA PNEUMONIAE  Final      Susceptibility   Klebsiella pneumoniae - MIC*    AMPICILLIN >=32 RESISTANT Resistant     CEFEPIME  <=0.12 SENSITIVE Sensitive     CEFTAZIDIME <=1 SENSITIVE Sensitive     CEFTRIAXONE  <=0.25 SENSITIVE Sensitive     CIPROFLOXACIN <=0.25 SENSITIVE Sensitive     GENTAMICIN <=1 SENSITIVE Sensitive     IMIPENEM <=0.25 SENSITIVE Sensitive     TRIMETH/SULFA <=20 SENSITIVE Sensitive     AMPICILLIN/SULBACTAM 4 SENSITIVE Sensitive     PIP/TAZO <=4 SENSITIVE Sensitive ug/mL   Klebsiella pneumoniae - KIRBY BAUER*    CEFAZOLIN  Value in next row Sensitive      SENSITIVEPerformed at [facility], [address], [place], [place] [address]    * KLEBSIELLA PNEUMONIAE    KLEBSIELLA PNEUMONIAE   Proteus mirabilis - KIRBY BAUER*    CEFAZOLIN  Value in next row Intermediate      SENSITIVEPerformed at [facility], [address] [place], [place] [address]   Proteus mirabilis - MIC*    AMPICILLIN Value in next row Sensitive      SENSITIVEPerformed at [facility], [address] [place], [place] [address]    CEFEPIME  Value in next row Sensitive      SENSITIVEPerformed at [facility], [address] [place], [place] [address]    CEFTAZIDIME Value in next row Sensitive      SENSITIVEPerformed at [facility], [address] [place], [place] [address]    CEFTRIAXONE  Value in next row Sensitive      SENSITIVEPerformed at [facility], [address] [place], [place] [address]    CIPROFLOXACIN Value in next row Sensitive      SENSITIVEPerformed at [facility], [address], [place], [place] [address]    GENTAMICIN Value in next row Sensitive       SENSITIVEPerformed at [facility], [address] [place], [place] [address]    IMIPENEM Value in next row Sensitive      SENSITIVEPerformed at [facility], [address], [place], [place] [address]    TRIMETH/SULFA Value in next row Sensitive      SENSITIVEPerformed at [facility], [address], [place], [place] [address]    AMPICILLIN/SULBACTAM Value in next row Sensitive      SENSITIVEPerformed at [facility], [address], [place], [place] [address]    PIP/TAZO Value in next row Sensitive ug/mL     SENSITIVEPerformed at [facility], [address] [place], [place] [address]    * PROTEUS MIRABILIS    PROTEUS MIRABILIS  Blood Culture ID Panel (Reflexed)     Status: Abnormal   Collection Time: 02/17/24  6:54 PM  Result Value Ref Range Status   Enterococcus faecalis NOT DETECTED NOT DETECTED Final   Enterococcus Faecium NOT DETECTED NOT DETECTED Final   Listeria monocytogenes NOT DETECTED NOT DETECTED Final   Staphylococcus species NOT DETECTED NOT DETECTED Final   Staphylococcus aureus (BCID) NOT DETECTED NOT DETECTED Final   Staphylococcus epidermidis NOT DETECTED NOT DETECTED Final  Staphylococcus lugdunensis NOT DETECTED NOT DETECTED Final   Streptococcus species NOT DETECTED NOT DETECTED Final   Streptococcus agalactiae NOT DETECTED NOT DETECTED Final   Streptococcus pneumoniae NOT DETECTED NOT DETECTED Final   Streptococcus pyogenes NOT DETECTED NOT DETECTED Final   A.calcoaceticus-baumannii NOT DETECTED NOT DETECTED Final   Bacteroides fragilis NOT DETECTED NOT DETECTED Final   Enterobacterales DETECTED (A) NOT DETECTED Final    Comment: CRITICAL RESULT CALLED TO, READ BACK BY AND VERIFIED WITH: IDOLINA PERCY PHARMD [id number] 02/18/24 HNM    Enterobacter cloacae complex NOT DETECTED NOT DETECTED Final   Escherichia coli NOT DETECTED NOT DETECTED Final   Klebsiella aerogenes NOT DETECTED NOT DETECTED Final   Klebsiella oxytoca NOT DETECTED  NOT DETECTED Final   Klebsiella pneumoniae DETECTED (A) NOT DETECTED Final    Comment: CRITICAL RESULT CALLED TO, READ BACK BY AND VERIFIED WITHBETHA IDOLINA PERCY PHARMD [id number] 02/18/24 HNM    Proteus species DETECTED (A) NOT DETECTED Final    Comment: CRITICAL RESULT CALLED TO, READ BACK BY AND VERIFIED WITH: IDOLINA PERCY PHARMD [id number] 02/18/24 HNM    Salmonella species NOT DETECTED NOT DETECTED Final   Serratia marcescens NOT DETECTED NOT DETECTED Final   Haemophilus influenzae NOT DETECTED NOT DETECTED Final   Neisseria meningitidis NOT DETECTED NOT DETECTED Final   Pseudomonas aeruginosa NOT DETECTED NOT DETECTED Final   Stenotrophomonas maltophilia NOT DETECTED NOT DETECTED Final   Candida albicans NOT DETECTED NOT DETECTED Final   Candida auris NOT DETECTED NOT DETECTED Final   Candida glabrata NOT DETECTED NOT DETECTED Final   Candida krusei NOT DETECTED NOT DETECTED Final   Candida parapsilosis NOT DETECTED NOT DETECTED Final   Candida tropicalis NOT DETECTED NOT DETECTED Final   Cryptococcus neoformans/gattii NOT DETECTED NOT DETECTED Final   CTX-M ESBL NOT DETECTED NOT DETECTED Final   Carbapenem resistance IMP NOT DETECTED NOT DETECTED Final   Carbapenem resistance KPC NOT DETECTED NOT DETECTED Final   Carbapenem resistance NDM NOT DETECTED NOT DETECTED Final   Carbapenem resist OXA 48 LIKE NOT DETECTED NOT DETECTED Final   Carbapenem resistance VIM NOT DETECTED NOT DETECTED Final    Comment: Performed at [facility], [address], [place], [place] [address]  Urine Culture     Status: Abnormal   Collection Time: 02/17/24  7:29 PM   Specimen: Urine, Random  Result Value Ref Range Status   Specimen Description   Final    URINE, RANDOM Performed at [facility], [address] [place], [place] [address]    Special Requests   Final    NONE Performed at [facility], [address] [place], [place] [address]    Culture MULTIPLE SPECIES PRESENT,  SUGGEST RECOLLECTION (A)  Final   Report Status 02/18/2024 FINAL  Final     Radiology Studies: No results found.   Scheduled Meds:  aspirin   81 mg Oral Daily   atorvastatin   40 mg Oral QHS   Chlorhexidine  Gluconate Cloth  6 each Topical Daily   ciprofloxacin  500 mg Oral BID   citalopram   20 mg Oral Daily   feeding supplement  237 mL Oral BID BM   gabapentin   300 mg Oral BID   lactulose   20 g Oral TID   multivitamin with minerals  1 tablet Oral Daily   pantoprazole   40 mg Oral Daily   QUEtiapine   50 mg Oral QHS   rifaximin   550 mg Oral BID   Continuous Infusions:  LOS: 4 days  MDM: Patient is high risk for one or [name] organ failure.  They necessitate ongoing hospitalization for continued IV therapies and subsequent lab monitoring. Total time spent interpreting labs and vitals, reviewing the medical record, coordinating care amongst consultants and care team members, directly assessing and discussing care with the patient and/or family: 35 min  Alper Guilmette, DO [facility]  To contact the attending physician between 7A-7P please use Epic Chat. To contact the covering physician during after hours 7P-7A, please review Amion.  02/22/2024, 1:20 PM   *This document has been created with the assistance of dictation software. Please excuse typographical errors. *

## 2024-02-22 NOTE — Discharge Summary (Signed)
 Physician Discharge Summary  [name] FMW:[id number] DOB: February 01, 1951 DOA: 02/17/2024  PCP: [facility]  Admit date: 02/17/2024 Discharge date: 02/22/2024   Recommendations for Outpatient Follow-up:  Follow up with PCP in 1-2 weeks to check kidney function and ensure it is stable since resumption of diuretic therapy Follow-up with EP and cardiology.  Previously given referral to EP for junctional rhythm which was persistent during this admission    Hospital Course: [name] with hepatitis C cirrhosis, hypertension, COPD, hyperlipidemia, non-insulin -dependent diabetes, depression, history of TIA, multiple prior admissions for hepatic encephalopathy, who presents after falling out of bed at his care home.  In the ED vitals were unremarkable initially but then had reported fever of 103.3.  Initially labs were unremarkable, no leukocytosis, procalcitonin 0.31, lactic acid 1.9, and UA was positive for large leukocytes.  And repeat lactic acid rose to 9.0.  Blood cultures were obtained.  Patient was started on 2 g ceftriaxone  and given 2 L LR.  He was admitted for further workup. Labs revealed UTI and bacteremia.  Patient was persistently febrile despite antibiotic therapy and infectious disease consultation was obtained.  Ultimately decision was made to proceed with prolonged course of ciprofloxacin and patient has done well. Stay was briefly complicated by AKI, during which time his diuretics were held and creatinine resolved to baseline.  Patient was becoming diffusely edematous again on 6/28 so diuretics have been resumed.  He will need to follow-up with his PCP for continued kidney function monitoring.  There was some confusion over patient's discharge plan.  We initially plan to return to group home with home health but patient briefly endorsed desire for rehab.  By 6/28 patient was clear that he wanted to return to his group home and was no longer interested in skilled  nursing facility or rehab placement.  He will be discharged directly to his group home today.  Sepsis secondary to UTI and bacteremia Leukopenia Lactic acidosis - Criteria: Fever, tachypnea, lactic acidosis.     Complicated UTI due to foley catheter, present on arrival - Patient has chronic indwelling Foley catheter, this was changed 6/24 - Urine culture currently with multiple species   Bacteremia - Blood cultures currently growing Proteus and Klebsiella. - Was persistently febrile despite ceftriaxone .  Changed to Merrem  on 6/25. -Have consulted with ID and pharmacy.  Given sensitivities we will proceed with ciprofloxacin for an additional 6 days to complete course. - Initial WBC 7.4 though becoming increasingly leukopenic.  Now down to 2.5. -Fever curve has improved  Bradycardia, chronic - Present on arrival.  Chronic. - Patient has been evaluated in the cardiology clinic for junctional rhythm.  He was referred to EP.  Has not yet followed up.  Have encouraged patient to do so  Liver cirrhosis Hyperammonemia - Chronically on Xifaxan  and lactulose , continue - Appears to be at his mental status baseline for now   Leukopenia  - Likely complicated by liver cirrhosis and acute infection.  HIV negative   Thrombocytopenia - Secondary to cirrhosis as above -, No active signs of bleeding.  Hold anticoagulation   Hypertension - Stable.    Hypercholesterolemia - Continue statin   History of TIA - Continue aspirin  and statin   Depression and anxiety - Continue Seroquel    Weakness - Continue PT/OT   COPD, not currently in exacerbation - Currently on room air.   Normocytic anemia - Hemoglobin currently 8.0, baseline appears to be closer to 10 - Iron studies likely unreliable  given acute infection. - Trend CBC - Transfuse if under 7   AKI superimposed on CKD stage III AA - Baseline creatinine 1.3 - Creatinine elevated to 1.7, BUN elevated. - Diuretics were held,  creatinine has resolved.  To resume today - Repeat CMP with PCP - Renally dose with a creatinine clearance of 67   Heart failure preserved EF - Follows outpatient with cardiology - On daily Lasix  and spironolactone , were on hold due to AKI.  Increased swelling.  Have resumed now   Obesity II, poa Severe PCM, ruled out - RD consult   Discharge Instructions  Discharge Instructions     Call MD for:  difficulty breathing, headache or visual disturbances   Complete by: As directed    Call MD for:  difficulty breathing, headache or visual disturbances   Complete by: As directed    Call MD for:  persistant dizziness or light-headedness   Complete by: As directed    Call MD for:  persistant dizziness or light-headedness   Complete by: As directed    Call MD for:  persistant nausea and vomiting   Complete by: As directed    Call MD for:  persistant nausea and vomiting   Complete by: As directed    Call MD for:  severe uncontrolled pain   Complete by: As directed    Call MD for:  severe uncontrolled pain   Complete by: As directed    Call MD for:  temperature >100.4   Complete by: As directed    Call MD for:  temperature >100.4   Complete by: As directed    Diet general   Complete by: As directed    Diet general   Complete by: As directed    Discharge instructions   Complete by: As directed    Please follow-up with your primary care physician next week to check your kidney function and ensure that it is stable even with resuming your diuretic medications   Discharge instructions   Complete by: As directed    Follow up with your primary care physician to discuss the medication changes during this admission   Face-to-face encounter (required for Medicare/Medicaid patients)   Complete by: As directed    I [name] certify that this patient is under my care and that I, or a nurse practitioner or physician's assistant working with me, had a face-to-face encounter that meets  the physician face-to-face encounter requirements with this patient on 02/22/2024. The encounter with the patient was in whole, or in part for the following medical condition(s) which is the primary reason for home health care (List medical condition): hepatitis C cirrhosis, hypertension, COPD, hyperlipidemia, non-insulin -dependent diabetes, depression, history of TIA, multiple prior admissions for hepatic encephalopathy, who presents after falling out of bed at his care home   The encounter with the patient was in whole, or in part, for the following medical condition, which is the primary reason for home health care: hepatitis C cirrhosis, hypertension, COPD, hyperlipidemia, non-insulin -dependent diabetes, depression, history of TIA, multiple prior admissions for hepatic encephalopathy, who presents after falling out of bed at his care home   I certify that, based on my findings, the following services are medically necessary home health services: Physical therapy   Reason for Medically Necessary Home Health Services:  Therapy- Therapeutic Exercises to Increase Strength and Endurance Therapy- Investment banker, operational, Transfer Training and Stair Training     My clinical findings support the need for the above services:  Unable to leave  home safely without assistance and/or assistive device Unsafe ambulation due to balance issues     Further, I certify that my clinical findings support that this patient is homebound due to:  Unable to leave home safely without assistance Mental confusion Unsafe ambulation due to balance issues     Home Health   Complete by: As directed    To provide the following care/treatments:  PT OT Home Health Aide     Increase activity slowly   Complete by: As directed    Increase activity slowly   Complete by: As directed       Allergies as of 02/22/2024       Reactions   Bee Venom Anaphylaxis   Codeine  Itching   Only itching. Recently allergy tested at [facility]   Rsv Mrna  Pre-f Virus Vaccine Swelling   Meloxicam    Other Reaction(s): ?overdose of local given at dentist--had trouble breathing   Codeine     Novocain [procaine]    Sulfa Antibiotics Other (See Comments)   Sulfa Antibiotics    hallucinations        Medication List     TAKE these medications    acetaminophen  650 MG CR tablet Commonly known as: TYLENOL  Take 650 mg by mouth every 8 (eight) hours as needed for pain.   aspirin  81 MG chewable tablet Chew 81 mg by mouth daily.   atorvastatin  40 MG tablet Commonly known as: LIPITOR Take 40 mg by mouth daily.   ciprofloxacin 500 MG tablet Commonly known as: CIPRO Take 1 tablet (500 mg total) by mouth 2 (two) times daily for 6 days.   citalopram  20 MG tablet Commonly known as: CELEXA  Take 20 mg by mouth daily.   feeding supplement Liqd Take 237 mLs by mouth 3 (three) times daily between meals.   furosemide  20 MG tablet Commonly known as: LASIX  Take 2 tablets (40 mg total) by mouth daily.   gabapentin  300 MG capsule Commonly known as: NEURONTIN  Take 300 mg by mouth 2 (two) times daily.   lactulose  10 GM/15ML solution Commonly known as: CHRONULAC  Take 67.5 mLs (45 g total) by mouth 3 (three) times daily. Please titrate to have 2-3 soft bowel movements daily   methocarbamol  500 MG tablet Commonly known as: ROBAXIN  Take 500 mg by mouth 4 (four) times daily.   multivitamin with minerals Tabs tablet Take 1 tablet by mouth daily.   nitroGLYCERIN  0.4 MG/SPRAY spray Commonly known as: NITROLINGUAL  Place 1 spray under the tongue every 5 (five) minutes x 3 doses as needed for chest pain.   ondansetron  4 MG tablet Commonly known as: ZOFRAN  Take 1 tablet (4 mg total) by mouth every 6 (six) hours as needed for nausea.   pantoprazole  40 MG tablet Commonly known as: PROTONIX  Take 40 mg by mouth daily.   phenol 1.4 % Liqd Commonly known as: CHLORASEPTIC Use as directed 1 spray in the mouth or throat as needed for throat  irritation / pain.   QUEtiapine  50 MG tablet Commonly known as: SEROQUEL  Take 50 mg by mouth at bedtime.   senna 8.6 MG Tabs tablet Commonly known as: SENOKOT Take 1 tablet (8.6 mg total) by mouth 2 (two) times daily as needed for mild constipation.   spironolactone  25 MG tablet Commonly known as: ALDACTONE  Take 1 tablet (25 mg total) by mouth daily.   Ventolin  HFA 108 (90 Base) MCG/ACT inhaler Generic drug: albuterol  Inhale 2 puffs into the lungs every 4 (four) hours as needed for wheezing.  albuterol  (2.5 MG/3ML) 0.083% nebulizer solution Commonly known as: PROVENTIL  Take 2.5 mg by nebulization every 4 (four) hours as needed for wheezing or shortness of breath.   Xifaxan  550 MG Tabs tablet Generic drug: rifaximin  Take 550 mg by mouth 2 (two) times daily.        Follow-up Information     [facility] Follow up.   Why: hospital follow up Contact information: [address] [place] [address] [phone]                Allergies  Allergen Reactions   Bee Venom Anaphylaxis   Codeine  Itching    Only itching. Recently allergy tested at [facility] Pre-F Virus Vaccine Swelling   Meloxicam     Other Reaction(s): ?overdose of local given at dentist--had trouble breathing   Codeine     Novocain [Procaine]    Sulfa Antibiotics Other (See Comments)   Sulfa Antibiotics     hallucinations    Consultations: Treatment Team:  Epifanio Alm SQUIBB, MD   Procedures/Studies: US  Abdomen Limited RUQ (LIVER/GB) Result Date: 02/18/2024 CLINICAL DATA:  Elevated bilirubin.  Prior cholecystectomy. EXAM: ULTRASOUND ABDOMEN LIMITED RIGHT UPPER QUADRANT COMPARISON:  Ultrasound 11/06/2022.  CT 06/19/2023. FINDINGS: Gallbladder: Previous cholecystectomy Common bile duct: Diameter: Dilated common duct up to 20 mm. On prior CT scan this had measured 2.8 cm. Chronic process. Please correlate with history. Liver: Nodular echogenic heterogeneous liver consistent  with known chronic liver disease. With this level of echogenicity evaluation for underlying mass lesion is limited and if needed follow-up contrast CT or MRI as clinically appropriate. Portal vein is patent on color Doppler imaging with normal direction of blood flow towards the liver. Other: None. IMPRESSION: Nodular heterogeneous liver. Previous cholecystectomy. Dilated common duct but this has been previously described. Please correlate with history. Electronically Signed   By: Ranell Bring M.D.   On: 02/18/2024 10:53   US  Venous Img Lower Bilateral (DVT) Result Date: 02/18/2024 CLINICAL DATA:  Bilateral leg swelling EXAM: Bilateral LOWER EXTREMITY VENOUS DOPPLER ULTRASOUND TECHNIQUE: Gray-scale sonography with compression, as well as color and duplex ultrasound, were performed to evaluate the deep venous system(s) from the level of the common femoral vein through the popliteal and proximal calf veins. COMPARISON:  Left lower extremity venous Doppler ultrasound 03/23/2023 FINDINGS: VENOUS Normal compressibility of the common femoral, superficial femoral, and popliteal veins, as well as the visualized calf veins. Visualized portions of profunda femoral vein and great saphenous vein unremarkable. No filling defects to suggest DVT on grayscale or color Doppler imaging. Doppler waveforms show normal direction of venous flow, normal respiratory plasticity and response to augmentation. Limited views of the contralateral common femoral vein are unremarkable. OTHER None. Limitations: none IMPRESSION: No DVT identified bilateral lower extremities. Mild subcutaneous edema identified below the knee bilaterally Electronically Signed   By: Cordella Banner   On: 02/18/2024 09:33   DG Chest Port 1 View Result Date: 02/17/2024 CLINICAL DATA:  Questionable sepsis EXAM: PORTABLE CHEST 1 VIEW COMPARISON:  Chest x-ray 01/11/2024 FINDINGS: The heart is mildly enlarged. There is no focal lung infiltrate, pleural effusion or  pneumothorax. Cervical spinal fusion plate is present. The IMPRESSION: Mild cardiomegaly. No acute pulmonary process. Electronically Signed   By: Greig Pique M.D.   On: 02/17/2024 19:59      Discharge Exam: Vitals:   02/22/24 0440 02/22/24 0822  BP: (!) 121/57 (!) 104/51  Pulse: (!) 49 (!) 46  Resp:  18  Temp: 99.2  F (37.3 C) 97.9 F (36.6 C)  SpO2: 99% 96%   Vitals:   02/22/24 0040 02/22/24 0345 02/22/24 0440 02/22/24 0822  BP: 114/64 129/86 (!) 121/57 (!) 104/51  Pulse: (!) 49 (!) 110 (!) 49 (!) 46  Resp:    18  Temp: 99.7 F (37.6 C) 99.8 F (37.7 C) 99.2 F (37.3 C) 97.9 F (36.6 C)  TempSrc:    Oral  SpO2: 98% 95% 99% 96%  Weight:      Height:        Constitutional:  Normal appearance. Non toxic-appearing.  HENT: Head Normocephalic and atraumatic.  Mucous membranes are moist.  Eyes:  Extraocular intact. Conjunctivae normal.  Cardiovascular: Rate and Rhythm: Normal rate and regular rhythm.  Pulmonary: Non labored, symmetric rise of chest wall.  Skin: warm and dry. not jaundiced.  Lower extremity swelling GU: Scrotal swelling, Foley catheter in place, clear darkened urine Neurological: No focal deficit present. alert. Oriented.  Psychiatric: Mood and Affect congruent.    The results of significant diagnostics from this hospitalization (including imaging, microbiology, ancillary and laboratory) are listed below for reference.     Microbiology: Recent Results (from the past 240 hours)  Blood Culture (routine x 2)     Status: Abnormal   Collection Time: 02/17/24  6:51 PM   Specimen: BLOOD LEFT HAND  Result Value Ref Range Status   Specimen Description   Final    BLOOD LEFT HAND Performed at [facility], [address], [place], [place] [address]    Special Requests   Final    BOTTLES DRAWN AEROBIC AND ANAEROBIC Blood Culture results may not be optimal due to an inadequate volume of blood received in culture bottles Performed at [facility], [address], [place], [place] [address]    Culture  Setup Time   Final    GRAM NEGATIVE RODS AEROBIC BOTTLE ONLY CRITICAL VALUE NOTED.  VALUE IS CONSISTENT WITH PREVIOUSLY REPORTED AND CALLED VALUE. Performed at [facility], [address] [place], [place] [address]    Culture (A)  Final    PROTEUS MIRABILIS SUSCEPTIBILITIES PERFORMED ON PREVIOUS CULTURE WITHIN THE LAST 5 DAYS. Performed at [facility], [address] [place], [place] [address]    Report Status 02/20/2024 FINAL  Final  Blood Culture (routine x 2)     Status: Abnormal   Collection Time: 02/17/24  6:54 PM   Specimen: BLOOD  Result Value Ref Range Status   Specimen Description BLOOD LEFT ANTECUBITAL  Final   Special Requests   Final    BOTTLES DRAWN AEROBIC AND ANAEROBIC Blood Culture adequate volume   Culture  Setup Time   Final    GRAM NEGATIVE RODS IN BOTH AEROBIC AND ANAEROBIC BOTTLES Organism ID to follow CRITICAL RESULT CALLED TO, READ BACK BY AND VERIFIED WITH: IDOLINA PERCY PHARMD [id number] 02/18/24 HNM GRAM STAIN REVIEWED-AGREE WITH RESULT DRT    Culture PROTEUS MIRABILIS KLEBSIELLA PNEUMONIAE  (A)  Final   Report Status 02/21/2024 FINAL  Final   Organism ID, Bacteria PROTEUS MIRABILIS  Final   Organism ID, Bacteria PROTEUS MIRABILIS  Final   Organism ID, Bacteria KLEBSIELLA PNEUMONIAE  Final   Organism ID, Bacteria KLEBSIELLA PNEUMONIAE  Final      Susceptibility   Klebsiella pneumoniae - MIC*    AMPICILLIN >=32 RESISTANT Resistant     CEFEPIME  <=0.12 SENSITIVE Sensitive     CEFTAZIDIME <=1 SENSITIVE Sensitive     CEFTRIAXONE  <=0.25 SENSITIVE  Sensitive     CIPROFLOXACIN <=0.25 SENSITIVE Sensitive     GENTAMICIN <=1 SENSITIVE Sensitive     IMIPENEM <=0.25 SENSITIVE Sensitive     TRIMETH/SULFA <=20 SENSITIVE Sensitive     AMPICILLIN/SULBACTAM 4 SENSITIVE Sensitive     PIP/TAZO <=4 SENSITIVE Sensitive ug/mL   Klebsiella pneumoniae - KIRBY BAUER*    CEFAZOLIN  Value in next row  Sensitive      SENSITIVEPerformed at [facility], [address], [place], [place] [address]    * KLEBSIELLA PNEUMONIAE    KLEBSIELLA PNEUMONIAE   Proteus mirabilis - KIRBY BAUER*    CEFAZOLIN  Value in next row Intermediate      SENSITIVEPerformed at [facility], [address], [place], [place] [address]   Proteus mirabilis - MIC*    AMPICILLIN Value in next row Sensitive      SENSITIVEPerformed at [facility], [address] [place], [place] [address]    CEFEPIME  Value in next row Sensitive      SENSITIVEPerformed at [facility], [address] [place], [place] [address]    CEFTAZIDIME Value in next row Sensitive      SENSITIVEPerformed at [facility], [address] [place], [place] [address]    CEFTRIAXONE  Value in next row Sensitive      SENSITIVEPerformed at [facility], [address] [place], [place] [address]    CIPROFLOXACIN Value in next row Sensitive      SENSITIVEPerformed at [facility], [address] [place], [place] [address]    GENTAMICIN Value in next row Sensitive      SENSITIVEPerformed at [facility], [address], [place], [place] [address]    IMIPENEM Value in next row Sensitive      SENSITIVEPerformed at [facility], [address] [place], [place] [address]    TRIMETH/SULFA Value in next row Sensitive      SENSITIVEPerformed at [facility], [address], [place], [place] [address]    AMPICILLIN/SULBACTAM Value in next row Sensitive      SENSITIVEPerformed at [facility], [address], [place], [place] [address]    PIP/TAZO Value in next row Sensitive ug/mL     SENSITIVEPerformed at [facility], [address], [place], [place] [address]    * PROTEUS MIRABILIS    PROTEUS MIRABILIS  Blood Culture ID Panel (Reflexed)     Status: Abnormal   Collection Time: 02/17/24  6:54 PM  Result Value Ref Range Status   Enterococcus faecalis NOT DETECTED NOT DETECTED Final    Enterococcus Faecium NOT DETECTED NOT DETECTED Final   Listeria monocytogenes NOT DETECTED NOT DETECTED Final   Staphylococcus species NOT DETECTED NOT DETECTED Final   Staphylococcus aureus (BCID) NOT DETECTED NOT DETECTED Final   Staphylococcus epidermidis NOT DETECTED NOT DETECTED Final   Staphylococcus lugdunensis NOT DETECTED NOT DETECTED Final   Streptococcus species NOT DETECTED NOT DETECTED Final   Streptococcus agalactiae NOT DETECTED NOT DETECTED Final   Streptococcus pneumoniae NOT DETECTED NOT DETECTED Final   Streptococcus pyogenes NOT DETECTED NOT DETECTED Final   A.calcoaceticus-baumannii NOT DETECTED NOT DETECTED Final   Bacteroides fragilis NOT DETECTED NOT DETECTED Final   Enterobacterales DETECTED (A) NOT DETECTED Final    Comment: CRITICAL RESULT CALLED TO, READ BACK BY AND VERIFIED WITHBETHA IDOLINA PERCY PHARMD [id number] 02/18/24 HNM    Enterobacter cloacae complex NOT DETECTED NOT DETECTED Final  Escherichia coli NOT DETECTED NOT DETECTED Final   Klebsiella aerogenes NOT DETECTED NOT DETECTED Final   Klebsiella oxytoca NOT DETECTED NOT DETECTED Final   Klebsiella pneumoniae DETECTED (A) NOT DETECTED Final    Comment: CRITICAL RESULT CALLED TO, READ BACK BY AND VERIFIED WITH: IDOLINA PERCY PHARMD [id number] 02/18/24 HNM    Proteus species DETECTED (A) NOT DETECTED Final    Comment: CRITICAL RESULT CALLED TO, READ BACK BY AND VERIFIED WITH: IDOLINA PERCY PHARMD [id number] 02/18/24 HNM    Salmonella species NOT DETECTED NOT DETECTED Final   Serratia marcescens NOT DETECTED NOT DETECTED Final   Haemophilus influenzae NOT DETECTED NOT DETECTED Final   Neisseria meningitidis NOT DETECTED NOT DETECTED Final   Pseudomonas aeruginosa NOT DETECTED NOT DETECTED Final   Stenotrophomonas maltophilia NOT DETECTED NOT DETECTED Final   Candida albicans NOT DETECTED NOT DETECTED Final   Candida auris NOT DETECTED NOT DETECTED Final   Candida glabrata NOT DETECTED NOT DETECTED Final   Candida krusei  NOT DETECTED NOT DETECTED Final   Candida parapsilosis NOT DETECTED NOT DETECTED Final   Candida tropicalis NOT DETECTED NOT DETECTED Final   Cryptococcus neoformans/gattii NOT DETECTED NOT DETECTED Final   CTX-M ESBL NOT DETECTED NOT DETECTED Final   Carbapenem resistance IMP NOT DETECTED NOT DETECTED Final   Carbapenem resistance KPC NOT DETECTED NOT DETECTED Final   Carbapenem resistance NDM NOT DETECTED NOT DETECTED Final   Carbapenem resist OXA 48 LIKE NOT DETECTED NOT DETECTED Final   Carbapenem resistance VIM NOT DETECTED NOT DETECTED Final    Comment: Performed at [facility], [address] [place], [place] [address]  Urine Culture     Status: Abnormal   Collection Time: 02/17/24  7:29 PM   Specimen: Urine, Random  Result Value Ref Range Status   Specimen Description   Final    URINE, RANDOM Performed at [facility], [address] [place], [place] [address]    Special Requests   Final    NONE Performed at [facility], [address] [place], [place] [address]    Culture MULTIPLE SPECIES PRESENT, SUGGEST RECOLLECTION (A)  Final   Report Status 02/18/2024 FINAL  Final     Labs: BNP (last 3 results) Recent Labs    08/22/23 1150 10/21/23 1842 12/30/23 1631  BNP 252.4* 250.6* 381.2*   Basic Metabolic Panel: Recent Labs  Lab 02/17/24 1851 02/18/24 0323 02/19/24 0936 02/20/24 0812 02/22/24 0539  NA 137 139 137 136 137  K 3.7 3.8 3.8 4.0 4.4  CL 110 111 109 107 109  CO2 19* 22 22 24 23   GLUCOSE 79 77 101* 102* 88  BUN 19 21 30* 29* 26*  CREATININE 1.33* 1.45* 1.70* 1.45* 1.13  CALCIUM  8.9 8.9 8.9 8.7* 9.3  MG 1.3*  --   --   --  1.6*  PHOS  --   --   --   --  2.0*   Liver Function Tests: Recent Labs  Lab 02/17/24 1851 02/18/24 0323 02/19/24 0936 02/20/24 0812 02/22/24 0539  AST 37 43* 53* 54* 92*  ALT 19 21 23 26  46*  ALKPHOS 51 48 41 45 60  BILITOT 2.5* 2.5* 1.2 1.3* 1.0  PROT 5.8* 5.8* 5.2* 4.8* 4.8*  ALBUMIN   2.4* 2.5* 2.1* 1.8* 1.8*   No results for input(s): LIPASE, AMYLASE in the last 168 hours. Recent Labs  Lab 02/17/24 1929  AMMONIA 58*   CBC: Recent Labs  Lab 02/17/24 1929 02/18/24 0323 02/19/24  9063 02/20/24 0812 02/22/24 0539  WBC 7.2 6.4 3.0* 2.5* 3.1*  NEUTROABS 6.2  --  2.4 1.6* 1.7  HGB 8.3* 8.6* 8.5* 8.0* 8.2*  HCT 25.1* 26.2* 25.7* 24.1* 24.3*  MCV 94.0 94.2 93.1 93.1 92.7  PLT 57* 51* 48* 50* 61*   Cardiac Enzymes: Recent Labs  Lab 02/17/24 1851  CKTOTAL 178   BNP: Invalid input(s): POCBNP CBG: Recent Labs  Lab 02/21/24 0730 02/21/24 1132 02/21/24 1945 02/22/24 0758 02/22/24 1151  GLUCAP 90 137* 119* 90 144*   D-Dimer No results for input(s): DDIMER in the last 72 hours. Hgb A1c No results for input(s): HGBA1C in the last 72 hours. Lipid Profile No results for input(s): CHOL, HDL, LDLCALC, TRIG, CHOLHDL, LDLDIRECT in the last 72 hours. Thyroid  function studies No results for input(s): TSH, T4TOTAL, T3FREE, THYROIDAB in the last 72 hours.  Invalid input(s): FREET3 Anemia work up No results for input(s): VITAMINB12, FOLATE, FERRITIN, TIBC, IRON, RETICCTPCT in the last 72 hours. Urinalysis    Component Value Date/Time   COLORURINE AMBER (A) 02/17/2024 1929   APPEARANCEUR CLOUDY (A) 02/17/2024 1929   LABSPEC 1.022 02/17/2024 1929   PHURINE 9.0 (H) 02/17/2024 1929   GLUCOSEU NEGATIVE 02/17/2024 1929   HGBUR SMALL (A) 02/17/2024 1929   BILIRUBINUR NEGATIVE 02/17/2024 1929   KETONESUR NEGATIVE 02/17/2024 1929   PROTEINUR >=300 (A) 02/17/2024 1929   NITRITE NEGATIVE 02/17/2024 1929   LEUKOCYTESUR LARGE (A) 02/17/2024 1929   Sepsis Labs Recent Labs  Lab 02/18/24 0323 02/19/24 0936 02/20/24 0812 02/22/24 0539  WBC 6.4 3.0* 2.5* 3.1*   Microbiology Recent Results (from the past 240 hours)  Blood Culture (routine x 2)     Status: Abnormal   Collection Time: 02/17/24  6:51 PM   Specimen: BLOOD  LEFT HAND  Result Value Ref Range Status   Specimen Description   Final    BLOOD LEFT HAND Performed at [facility], [address], [place], [place] [address]    Special Requests   Final    BOTTLES DRAWN AEROBIC AND ANAEROBIC Blood Culture results may not be optimal due to an inadequate volume of blood received in culture bottles Performed at [facility], [address] [place], [place] [address]    Culture  Setup Time   Final    GRAM NEGATIVE RODS AEROBIC BOTTLE ONLY CRITICAL VALUE NOTED.  VALUE IS CONSISTENT WITH PREVIOUSLY REPORTED AND CALLED VALUE. Performed at [facility], [address] [place], [place] [address]    Culture (A)  Final    PROTEUS MIRABILIS SUSCEPTIBILITIES PERFORMED ON PREVIOUS CULTURE WITHIN THE LAST 5 DAYS. Performed at [facility], [address] [place], [place] [address]    Report Status 02/20/2024 FINAL  Final  Blood Culture (routine x 2)     Status: Abnormal   Collection Time: 02/17/24  6:54 PM   Specimen: BLOOD  Result Value Ref Range Status   Specimen Description BLOOD LEFT ANTECUBITAL  Final   Special Requests   Final    BOTTLES DRAWN AEROBIC AND ANAEROBIC Blood Culture adequate volume   Culture  Setup Time   Final    GRAM NEGATIVE RODS IN BOTH AEROBIC AND ANAEROBIC BOTTLES Organism ID to follow CRITICAL RESULT CALLED TO, READ BACK BY AND VERIFIED WITHBETHA IDOLINA PERCY PHARMD [id number] 02/18/24 HNM GRAM STAIN REVIEWED-AGREE WITH RESULT DRT    Culture PROTEUS MIRABILIS KLEBSIELLA PNEUMONIAE  (A)  Final   Report Status 02/21/2024 FINAL  Final  Organism ID, Bacteria PROTEUS MIRABILIS  Final   Organism ID, Bacteria PROTEUS MIRABILIS  Final   Organism ID, Bacteria KLEBSIELLA PNEUMONIAE  Final   Organism ID, Bacteria KLEBSIELLA PNEUMONIAE  Final      Susceptibility   Klebsiella pneumoniae - MIC*    AMPICILLIN >=32 RESISTANT Resistant     CEFEPIME  <=0.12 SENSITIVE Sensitive     CEFTAZIDIME <=1 SENSITIVE Sensitive      CEFTRIAXONE  <=0.25 SENSITIVE Sensitive     CIPROFLOXACIN <=0.25 SENSITIVE Sensitive     GENTAMICIN <=1 SENSITIVE Sensitive     IMIPENEM <=0.25 SENSITIVE Sensitive     TRIMETH/SULFA <=20 SENSITIVE Sensitive     AMPICILLIN/SULBACTAM 4 SENSITIVE Sensitive     PIP/TAZO <=4 SENSITIVE Sensitive ug/mL   Klebsiella pneumoniae - KIRBY BAUER*    CEFAZOLIN  Value in next row Sensitive      SENSITIVEPerformed at [facility], [address], [place], [place] [address]    * KLEBSIELLA PNEUMONIAE    KLEBSIELLA PNEUMONIAE   Proteus mirabilis - KIRBY BAUER*    CEFAZOLIN  Value in next row Intermediate      SENSITIVEPerformed at [facility], [address] [place], [place] [address]   Proteus mirabilis - MIC*    AMPICILLIN Value in next row Sensitive      SENSITIVEPerformed at [facility], [address] [place], [place] [address]    CEFEPIME  Value in next row Sensitive      SENSITIVEPerformed at [facility], [address] [place], [place] [address]    CEFTAZIDIME Value in next row Sensitive      SENSITIVEPerformed at [facility], [address], [place], [place] [address]    CEFTRIAXONE  Value in next row Sensitive      SENSITIVEPerformed at [facility], [address], [place], [place] [address]    CIPROFLOXACIN Value in next row Sensitive      SENSITIVEPerformed at [facility], [address], [place], [place] [address]    GENTAMICIN Value in next row Sensitive      SENSITIVEPerformed at [facility], [address] [place], [place] [address]    IMIPENEM Value in next row Sensitive      SENSITIVEPerformed at [facility], [address], [place], [place] [address]    TRIMETH/SULFA Value in next row Sensitive      SENSITIVEPerformed at [facility], [address] [place], [place] [address]    AMPICILLIN/SULBACTAM Value in next row Sensitive      SENSITIVEPerformed at [facility], [address], [place], [place]  [address]    PIP/TAZO Value in next row Sensitive ug/mL     SENSITIVEPerformed at [facility], [address], [place], [place] [address]    * PROTEUS MIRABILIS    PROTEUS MIRABILIS  Blood Culture ID Panel (Reflexed)     Status: Abnormal   Collection Time: 02/17/24  6:54 PM  Result Value Ref Range Status   Enterococcus faecalis NOT DETECTED NOT DETECTED Final   Enterococcus Faecium NOT DETECTED NOT DETECTED Final   Listeria monocytogenes NOT DETECTED NOT DETECTED Final   Staphylococcus species NOT DETECTED NOT DETECTED Final   Staphylococcus aureus (BCID) NOT DETECTED NOT DETECTED Final   Staphylococcus epidermidis NOT DETECTED NOT DETECTED Final   Staphylococcus lugdunensis NOT DETECTED NOT DETECTED Final   Streptococcus species NOT DETECTED NOT DETECTED Final   Streptococcus agalactiae NOT DETECTED NOT  DETECTED Final   Streptococcus pneumoniae NOT DETECTED NOT DETECTED Final   Streptococcus pyogenes NOT DETECTED NOT DETECTED Final   A.calcoaceticus-baumannii NOT DETECTED NOT DETECTED Final   Bacteroides fragilis NOT DETECTED NOT DETECTED Final   Enterobacterales DETECTED (A) NOT DETECTED Final    Comment: CRITICAL RESULT CALLED TO, READ BACK BY AND VERIFIED WITH: IDOLINA PERCY PHARMD [id number] 02/18/24 HNM    Enterobacter cloacae complex NOT DETECTED NOT DETECTED Final   Escherichia coli NOT DETECTED NOT DETECTED Final   Klebsiella aerogenes NOT DETECTED NOT DETECTED Final   Klebsiella oxytoca NOT DETECTED NOT DETECTED Final   Klebsiella pneumoniae DETECTED (A) NOT DETECTED Final    Comment: CRITICAL RESULT CALLED TO, READ BACK BY AND VERIFIED WITHBETHA IDOLINA PERCY PHARMD [id number] 02/18/24 HNM    Proteus species DETECTED (A) NOT DETECTED Final    Comment: CRITICAL RESULT CALLED TO, READ BACK BY AND VERIFIED WITH: IDOLINA PERCY PHARMD [id number] 02/18/24 HNM    Salmonella species NOT DETECTED NOT DETECTED Final   Serratia marcescens NOT DETECTED NOT DETECTED Final   Haemophilus influenzae NOT  DETECTED NOT DETECTED Final   Neisseria meningitidis NOT DETECTED NOT DETECTED Final   Pseudomonas aeruginosa NOT DETECTED NOT DETECTED Final   Stenotrophomonas maltophilia NOT DETECTED NOT DETECTED Final   Candida albicans NOT DETECTED NOT DETECTED Final   Candida auris NOT DETECTED NOT DETECTED Final   Candida glabrata NOT DETECTED NOT DETECTED Final   Candida krusei NOT DETECTED NOT DETECTED Final   Candida parapsilosis NOT DETECTED NOT DETECTED Final   Candida tropicalis NOT DETECTED NOT DETECTED Final   Cryptococcus neoformans/gattii NOT DETECTED NOT DETECTED Final   CTX-M ESBL NOT DETECTED NOT DETECTED Final   Carbapenem resistance IMP NOT DETECTED NOT DETECTED Final   Carbapenem resistance KPC NOT DETECTED NOT DETECTED Final   Carbapenem resistance NDM NOT DETECTED NOT DETECTED Final   Carbapenem resist OXA 48 LIKE NOT DETECTED NOT DETECTED Final   Carbapenem resistance VIM NOT DETECTED NOT DETECTED Final    Comment: Performed at [facility], [address] [place], [place] [address]  Urine Culture     Status: Abnormal   Collection Time: 02/17/24  7:29 PM   Specimen: Urine, Random  Result Value Ref Range Status   Specimen Description   Final    URINE, RANDOM Performed at [facility], [address] [name], [place] [address]    Special Requests   Final    NONE Performed at [facility], [address] [place], [place] [address]    Culture MULTIPLE SPECIES PRESENT, SUGGEST RECOLLECTION (A)  Final   Report Status 02/18/2024 FINAL  Final     Time coordinating discharge: 32 min   SIGNED: [name], MD  [facility] 02/22/2024, 2:50 PM Pager   If 7PM-7AM, please contact night-coverage

## 2024-02-22 NOTE — TOC Transition Note (Signed)
 Transition of Care [facility]) - Discharge Note   Patient Details  Name: [name] MRN: [id number] Date of Birth: 1951/02/06  [facility]) CM/SW Contact:  Marinda Cooks, RN Phone Number: 02/22/2024, 2:55 PM   Clinical Narrative:     This CM updated by covering MD pt medically cleared to dc today and has active DC order . This CM spoke with group home owner [name] Later and confirmed that pt can be received .DC transportation arranged /confirmed for pt by ALF  Medical team updated . No additional DC needs requested by medical team or identified by CM at this time .     Final next level of care: Assisted Living Barriers to Discharge: No Barriers Identified   Patient Goals and CMS Choice Patient states their goals for this hospitalization and ongoing recovery are:: rehab then return home          Discharge Placement    ALF     Name of family member notified: Patient Patient and family notified of of transfer: 02/22/24  Discharge Plan and Services Additional resources added to the After Visit Summary for     Discharge Planning Services: CM Consult                                 Social Drivers of Health (SDOH) Interventions SDOH Screenings   Food Insecurity: No Food Insecurity (02/17/2024)  Housing: Low Risk  (02/18/2024)  Transportation Needs: No Transportation Needs (02/17/2024)  Utilities: Not At Risk (02/17/2024)  Alcohol Screen: Low Risk  (09/24/2022)  Depression (PHQ2-9): High Risk (04/26/2022)  Financial Resource Strain: High Risk (04/27/2022)  Physical Activity: Sufficiently Active (04/16/2018)  Social Connections: Socially Isolated (02/17/2024)  Stress: Stress Concern Present (04/16/2018)  Tobacco Use: High Risk (02/18/2024)     Readmission Risk Interventions    11/29/2023    5:03 PM 11/13/2023    2:21 PM 11/12/2023    4:00 PM  Readmission Risk Prevention Plan  Transportation Screening Not Complete  Not Complete  Medication Review Furniture conservator/restorer) Not Complete Complete Not Complete  PCP or Specialist appointment within 3-5 days of discharge Not Complete Complete Complete  HRI or Home Care Consult Not Complete Complete   SW Recovery Care/Counseling Consult Not Complete Complete   Palliative Care Screening Not Applicable Complete   Skilled Nursing Facility Not Applicable Not Applicable

## 2024-02-22 NOTE — Progress Notes (Signed)
 Per Dr Marquette Sites, dc tele monitoring

## 2024-02-29 ENCOUNTER — Emergency Department
Admission: EM | Admit: 2024-02-29 | Discharge: 2024-02-29 | Disposition: A | Discharge status: Home / Self Care | Attending: Emergency Medicine | Admitting: Emergency Medicine

## 2024-02-29 ENCOUNTER — Other Ambulatory Visit: Payer: Self-pay

## 2024-02-29 DIAGNOSIS — T83038A Leakage of other indwelling urethral catheter, initial encounter: Secondary | ICD-10-CM | POA: Diagnosis present

## 2024-02-29 DIAGNOSIS — Y828 Other medical devices associated with adverse incidents: Secondary | ICD-10-CM | POA: Insufficient documentation

## 2024-02-29 DIAGNOSIS — T839XXA Unspecified complication of genitourinary prosthetic device, implant and graft, initial encounter: Secondary | ICD-10-CM

## 2024-02-29 NOTE — ED Notes (Signed)
 Pt given extra supplies for group home.

## 2024-02-29 NOTE — ED Notes (Signed)
 HR noted to be low, Pt states that's his norm d/t meds he's on.

## 2024-02-29 NOTE — ED Notes (Signed)
 Fall precautions in place for Pt. This RN placed fall band, fall grip socks, bed alarm and fall sign.

## 2024-02-29 NOTE — ED Triage Notes (Addendum)
 Arrives via EMS from [facility], [address] [place], [place]  [address], [phone] EMS assessment: Pt c/o of catheter bag leaking, staff were unsuccessful at changing bag so called EMS  No pain from bag, leaking since last night before bed EMS vitals: BP:132/72  HR: 46 O2: 98%  EMS interventions: BLS  This RN assessment:  No pain from urine bag leaking  PtHx: Chronic foley use

## 2024-02-29 NOTE — ED Provider Notes (Signed)
   [facility] Provider Note    Event Date/Time   First MD Initiated Contact with Patient 02/29/24 0800     (approximate)   History   Urinary Frequency (Catheter bag leaking )   HPI  [name] is a 73 y.o. male with indwelling Foley catheter who is here only because of leaking Foley leg bag, he has no other complaints. he reports there is a hole in the bag     Physical Exam   Triage Vital Signs: ED Triage Vitals  Encounter Vitals Group     BP 02/29/24 0801 97/69     Girls Systolic BP Percentile --      Girls Diastolic BP Percentile --      Boys Systolic BP Percentile --      Boys Diastolic BP Percentile --      Pulse Rate 02/29/24 0804 (!) 44     Resp 02/29/24 0805 12     Temp 02/29/24 0802 98.4 F (36.9 C)     Temp Source 02/29/24 0802 Oral     SpO2 02/29/24 0804 100 %     Weight 02/29/24 0803 109.4 kg (241 lb 1.6 oz)     Height 02/29/24 0803 1.676 m (5' 6)     Head Circumference --      Peak Flow --      Pain Score 02/29/24 0803 0     Pain Loc --      Pain Education --      Exclude from Growth Chart --     Most recent vital signs: Vitals:   02/29/24 0804 02/29/24 0805  BP:    Pulse: (!) 44   Resp:  12  Temp:    SpO2: 100%      General: Awake, no distress.  CV:  Good peripheral perfusion.  Resp:  Normal effort.  Abd:  No distention.  Other:     ED Results / Procedures / Treatments   Labs (all labs ordered are listed, but only abnormal results are displayed) Labs Reviewed - No data to display   EKG     RADIOLOGY     PROCEDURES:  Critical Care performed:   Procedures   MEDICATIONS ORDERED IN ED: Medications - No data to display   IMPRESSION / MDM / ASSESSMENT AND PLAN / ED COURSE  I reviewed the triage vital signs and the nursing notes. Patient's presentation is most consistent with acute, uncomplicated illness.  Nurse will replace bag, no other workup necessary at this time        FINAL  CLINICAL IMPRESSION(S) / ED DIAGNOSES   Final diagnoses:  Problem with Foley catheter, initial encounter [facility])     Rx / DC Orders   ED Discharge Orders     None        Note:  This document was prepared using Dragon voice recognition software and may include unintentional dictation errors.   Arlander Charleston, MD 02/29/24 [phone]

## 2024-02-29 NOTE — ED Notes (Signed)
 Called Mr. [name] he stated he would come and get Pt. Will be here in 45 mins as he is coming from [place].

## 2024-03-05 ENCOUNTER — Emergency Department
Admission: EM | Admit: 2024-03-05 | Discharge: 2024-03-06 | Disposition: A | Discharge status: Home / Self Care | Attending: Emergency Medicine | Admitting: Emergency Medicine

## 2024-03-05 ENCOUNTER — Other Ambulatory Visit: Payer: Self-pay

## 2024-03-05 ENCOUNTER — Emergency Department

## 2024-03-05 DIAGNOSIS — N5089 Other specified disorders of the male genital organs: Secondary | ICD-10-CM | POA: Insufficient documentation

## 2024-03-05 DIAGNOSIS — K7469 Other cirrhosis of liver: Secondary | ICD-10-CM | POA: Diagnosis not present

## 2024-03-05 DIAGNOSIS — N50819 Testicular pain, unspecified: Secondary | ICD-10-CM | POA: Insufficient documentation

## 2024-03-05 DIAGNOSIS — L981 Factitial dermatitis: Secondary | ICD-10-CM | POA: Diagnosis not present

## 2024-03-05 LAB — CBC WITH DIFFERENTIAL/PLATELET
Abs Immature Granulocytes: 0.01 K/uL (ref 0.00–0.07)
Basophils Absolute: 0 K/uL (ref 0.0–0.1)
Basophils Relative: 2 %
Eosinophils Absolute: 0.1 K/uL (ref 0.0–0.5)
Eosinophils Relative: 5 %
HCT: 25.2 % — ABNORMAL LOW (ref 39.0–52.0)
Hemoglobin: 8.1 g/dL — ABNORMAL LOW (ref 13.0–17.0)
Immature Granulocytes: 1 %
Lymphocytes Relative: 26 %
Lymphs Abs: 0.5 K/uL — ABNORMAL LOW (ref 0.7–4.0)
MCH: 30.8 pg (ref 26.0–34.0)
MCHC: 32.1 g/dL (ref 30.0–36.0)
MCV: 95.8 fL (ref 80.0–100.0)
Monocytes Absolute: 0.3 K/uL (ref 0.1–1.0)
Monocytes Relative: 14 %
Neutro Abs: 1 K/uL — ABNORMAL LOW (ref 1.7–7.7)
Neutrophils Relative %: 52 %
Platelets: 84 K/uL — ABNORMAL LOW (ref 150–400)
RBC: 2.63 MIL/uL — ABNORMAL LOW (ref 4.22–5.81)
RDW: 15.9 % — ABNORMAL HIGH (ref 11.5–15.5)
WBC: 1.9 K/uL — ABNORMAL LOW (ref 4.0–10.5)
nRBC: 0 % (ref 0.0–0.2)

## 2024-03-05 LAB — URINALYSIS, ROUTINE W REFLEX MICROSCOPIC
Bilirubin Urine: NEGATIVE
Glucose, UA: NEGATIVE mg/dL
Ketones, ur: NEGATIVE mg/dL
Leukocytes,Ua: NEGATIVE
Nitrite: NEGATIVE
Protein, ur: NEGATIVE mg/dL
Specific Gravity, Urine: 1.009 (ref 1.005–1.030)
Squamous Epithelial / HPF: 0 /HPF (ref 0–5)
pH: 5 (ref 5.0–8.0)

## 2024-03-05 LAB — BASIC METABOLIC PANEL WITH GFR
Anion gap: 7 (ref 5–15)
BUN: 14 mg/dL (ref 8–23)
CO2: 23 mmol/L (ref 22–32)
Calcium: 8.8 mg/dL — ABNORMAL LOW (ref 8.9–10.3)
Chloride: 109 mmol/L (ref 98–111)
Creatinine, Ser: 1.18 mg/dL (ref 0.61–1.24)
GFR, Estimated: >60 mL/min (ref >60)
Glucose, Bld: 140 mg/dL — ABNORMAL HIGH (ref 70–99)
Potassium: 3.8 mmol/L (ref 3.5–5.1)
Sodium: 139 mmol/L (ref 135–145)

## 2024-03-05 MED ORDER — CEPHALEXIN 500 MG PO CAPS
500.0000 mg | ORAL_CAPSULE | Freq: Once | ORAL | Status: AC
  Administered 2024-03-05: 500 mg via ORAL
  Filled 2024-03-05: qty 1

## 2024-03-05 MED ORDER — MORPHINE SULFATE (PF) 4 MG/ML IV SOLN
4.0000 mg | Freq: Once | INTRAVENOUS | Status: AC
  Administered 2024-03-05: 4 mg via INTRAVENOUS
  Filled 2024-03-05: qty 1

## 2024-03-05 MED ORDER — CEPHALEXIN 500 MG PO CAPS
500.0000 mg | ORAL_CAPSULE | Freq: Four times a day (QID) | ORAL | 0 refills | Status: AC
Start: 2024-03-05 — End: 2024-03-10

## 2024-03-05 NOTE — ED Triage Notes (Signed)
 Pt presents via EMS c/o testicle pain and swelling. Pt has foley in place.

## 2024-03-05 NOTE — ED Provider Notes (Signed)
 [facility] Provider Note    Event Date/Time   First MD Initiated Contact with Patient 03/05/24 2104     (approximate)   History   Testicle Pain   HPI  [name] is a 73 y.o. male who presents to the ED for evaluation of Testicle Pain   I review 6/28 medical DC summary as well as a 7/5 ED visit.  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  He was admitted last month for complicated UTI, blood cultures growing both Proteus and Klebsiella given sensitivities, discharged with 6 days of ciprofloxacin .  In the ED he just needed a leg bag replaced as it had a hole in it.  Patient presents for evaluation of swollen scrotum over the past couple days.  Reports it is uncomfortable, particular the rubbing against his legs and irritation.  Foley catheter functioning appropriately.  No fevers, abdominal pain, emesis, trauma   Physical Exam   Triage Vital Signs: ED Triage Vitals  Encounter Vitals Group     BP 03/05/24 2104 (!) 111/42     Girls Systolic BP Percentile --      Girls Diastolic BP Percentile --      Boys Systolic BP Percentile --      Boys Diastolic BP Percentile --      Pulse Rate 03/05/24 2104 (!) 50     Resp 03/05/24 2104 16     Temp 03/05/24 2104 98.7 F (37.1 C)     Temp Source 03/05/24 2104 Oral     SpO2 03/05/24 2104 99 %     Weight --      Height --      Head Circumference --      Peak Flow --      Pain Score 03/05/24 2059 10     Pain Loc --      Pain Education --      Exclude from Growth Chart --     Most recent vital signs: Vitals:   03/05/24 2104 03/05/24 2215  BP: (!) 111/42 (!) 124/59  Pulse: (!) 50 (!) 48  Resp: 16 15  Temp: 98.7 F (37.1 C)   SpO2: 99% 100%    General: Awake, no distress.  CV:  Good peripheral perfusion.  Resp:  Normal effort.  Abd:  No distention.  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   Scrotum is edematous diffusely/bilaterally with some mild and superficial excoriations against his thighs on each lateral side.  No drainage or areas of fluctuance.   No erythema or skin changes to the perineum, legs or inguinal canal.  Seems to be isolated to his scrotum MSK:  No deformity noted.  Neuro:  No focal deficits appreciated. Other:     ED Results / Procedures / Treatments   Labs (all labs ordered are listed, but only abnormal results are displayed) Labs Reviewed  URINALYSIS, ROUTINE W REFLEX MICROSCOPIC - Abnormal; Notable for the following components:      Result Value   Color, Urine YELLOW (*)    APPearance HAZY (*)    Hgb urine dipstick MODERATE (*)    Bacteria, UA RARE (*)    All other components within normal limits  CBC WITH DIFFERENTIAL/PLATELET - Abnormal; Notable for the following components:   WBC 1.9 (*)    RBC 2.63 (*)    Hemoglobin 8.1 (*)    HCT 25.2 (*)  RDW 15.9 (*)    Platelets 84 (*)    Neutro Abs 1.0 (*)    Lymphs Abs 0.5 (*)    All other components within normal limits  BASIC METABOLIC PANEL WITH GFR - Abnormal; Notable for the following components:   Glucose, Bld 140 (*)    Calcium  8.8 (*)    All other components within normal limits  URINE CULTURE    EKG   RADIOLOGY Scrotal u/s pending  Official radiology report(s): No results found.  PROCEDURES and INTERVENTIONS:  Procedures  Medications  morphine  (PF) 4 MG/ML injection 4 mg (4 mg Intravenous Given 03/05/24 2212)     IMPRESSION / MDM / ASSESSMENT AND PLAN / ED COURSE  I reviewed the triage vital signs and the nursing notes.  Differential diagnosis includes, but is not limited to, cellulitis, anasarca from his cirrhosis, sepsis, Fournier's gangrene  {Patient presents with symptoms of an acute illness or injury that is potentially life-threatening.  Cirrhotic patient with indwelling Foley presents with scrotal swelling.  Some superficial excoriations on exam.  Blood work  and urinalysis are generally reassuring.  Chronic anemia, renal function at baseline, UA without infectious features.  Awaiting scrotal ultrasound at the time of signout to oncoming physician      FINAL CLINICAL IMPRESSION(S) / ED DIAGNOSES   Final diagnoses:  Scrotal swelling  Other cirrhosis of liver (HCC)     Rx / DC Orders   ED Discharge Orders     None        Note:  This document was prepared using Dragon voice recognition software and may include unintentional dictation errors.   Claudene Rover, MD 03/05/24 2250

## 2024-03-05 NOTE — ED Provider Notes (Signed)
 Signed out pending the results of his scrotal ultrasound.  If negative plan will be for discharge on oral antibiotics for potential cellulitis given the redness and excoriations on the scrotum as noted by previous provider.  Ultrasound negative Keflex  prescription sent in first dose given in the Emergency Department patient discharged in stable condition.   Cyrena Mylar, MD 03/05/24 [phone]

## 2024-03-06 NOTE — ED Notes (Signed)
 Pts ride to [place] [facility] was here to pick up patient

## 2024-03-06 NOTE — ED Notes (Addendum)
 [name] at group home called x2 without answering. Unable to leave VM due to mailbox being full.

## 2024-03-06 NOTE — ED Notes (Addendum)
 Spoke with [name] Later at [facility] and report was given and he will arrange discharge transportation for pt. Pt aware

## 2024-03-06 NOTE — ED Notes (Signed)
 [facility] contacted for discharge transportation. They will be here after 0700. Pt aware.

## 2024-03-07 LAB — URINE CULTURE: Culture: NO GROWTH

## 2024-03-09 ENCOUNTER — Emergency Department
Admission: EM | Admit: 2024-03-09 | Discharge: 2024-03-10 | Disposition: A | Discharge status: Home / Self Care | Attending: Emergency Medicine | Admitting: Emergency Medicine

## 2024-03-09 DIAGNOSIS — I2510 Atherosclerotic heart disease of native coronary artery without angina pectoris: Secondary | ICD-10-CM | POA: Insufficient documentation

## 2024-03-09 DIAGNOSIS — T83021A Displacement of indwelling urethral catheter, initial encounter: Secondary | ICD-10-CM | POA: Insufficient documentation

## 2024-03-09 DIAGNOSIS — I509 Heart failure, unspecified: Secondary | ICD-10-CM | POA: Insufficient documentation

## 2024-03-09 DIAGNOSIS — T839XXA Unspecified complication of genitourinary prosthetic device, implant and graft, initial encounter: Secondary | ICD-10-CM

## 2024-03-09 DIAGNOSIS — Y732 Prosthetic and other implants, materials and accessory gastroenterology and urology devices associated with adverse incidents: Secondary | ICD-10-CM | POA: Insufficient documentation

## 2024-03-09 DIAGNOSIS — E119 Type 2 diabetes mellitus without complications: Secondary | ICD-10-CM | POA: Diagnosis not present

## 2024-03-09 NOTE — ED Provider Notes (Signed)
   [facility] Provider Note    Event Date/Time   First MD Initiated Contact with Patient 03/09/24 2316     (approximate)  History   Chief Complaint: Foley Catheter Removed  HPI  [name] is a 73 y.o. male with a past medical history of CHF, anxiety, CAD, chronic back pain, gastric reflux, diabetes, presents to the emergency department after an accidental Foley catheter dislodgment.  According to the patient he states he felt like he had to go to the bathroom, tried to get up quickly and accidentally pulled the catheter out.  States there is no inflated balloon.  Denies any penile pain, denies any bleeding.  Patient has had the catheter for approximately 2 to 3 months, has urology appointment coming up next week per patient.  Physical Exam   Triage Vital Signs: ED Triage Vitals  Encounter Vitals Group     BP --      Girls Systolic BP Percentile --      Girls Diastolic BP Percentile --      Boys Systolic BP Percentile --      Boys Diastolic BP Percentile --      Pulse Rate 03/09/24 2326 (!) 51     Resp --      Temp --      Temp src --      SpO2 03/09/24 2326 99 %     Weight --      Height --      Head Circumference --      Peak Flow --      Pain Score 03/09/24 2327 0     Pain Loc --      Pain Education --      Exclude from Growth Chart --     Most recent vital signs: Vitals:   03/09/24 2326  Pulse: (!) 51  SpO2: 99%    General: Awake, no distress.  CV:  Good peripheral perfusion. Resp:  Normal effort.   Abd:  No distention.  Soft, nontender.   ED Results / Procedures / Treatments   MEDICATIONS ORDERED IN ED: Medications - No data to display   IMPRESSION / MDM / ASSESSMENT AND PLAN / ED COURSE  I reviewed the triage vital signs and the nursing notes.  Patient's presentation is most consistent with acute, uncomplicated illness.  Patient presents to the emergency department after an accidental catheter dislodgment.  Overall the  patient appears well, no distress no penile pain no bleeding.  Patient brought the Foley catheter in with him, the balloon was deflated and present.  No sign of any trauma to the catheter.  We will attempt to replace with a new catheter.  As long as we are able to replace with a new catheter anticipate likely discharge home with outpatient neurology follow-up as scheduled.  FINAL CLINICAL IMPRESSION(S) / ED DIAGNOSES   Foley catheter dislodgment   Note:  This document was prepared using Dragon voice recognition software and may include unintentional dictation errors.

## 2024-03-09 NOTE — ED Triage Notes (Addendum)
 Pt BIB [facility] for foley catheter removal. Pt has had foley in place for about 2 months now and had the tubing changed out on Wednesday. Pt went to empty bag and foley completely fell out tonight. No sign of damage to catheter. Catheter knotted off to prevent leaking onto floor. Pt denies pain or discomfort to urethra.   Past Medical History:  Diagnosis Date   (HFpEF) heart failure with preserved ejection fraction (HCC)    a.) TTE 03/20/2018: EF 60-65%, no RWMAs, mild LAE, mild-mod MR, PASP 42; b.) TTE 03/31/2021: EF 55-60%, no RWMAs, mild LVH, mild LAE, AoV sclerosis without stenosis; c.) TTE 03/25/2022: EF 60-65%, no RWMAs, AoV sclerosis without stenosis; d.) TTE 12/24/2022: EF 60-65%, no RWMAs, mild LVH, mild LAE.   Anasarca    Anemia    Anxiety    Aortic atherosclerosis (HCC)    Arthritis    Asthma    Bacteremia due to Escherichia coli 05/2023   Bacteremia due to Klebsiella pneumoniae 2023   Bilateral carotid artery disease (HCC)    Bradycardia    CAD (coronary artery disease) 10/21/2006   a.) MV 10/21/2006: small reversible defect involving the apical  lateral wall c/w possible ischemia; b.) MV: 03/24/2018: no ischemia; c.) MV 10/04/2021: LAD calcifications   CAD (coronary artery disease)    Cervical radiculopathy    Chest pain    a.) MV 03/21/2018: no ischemia; b.) MV 10/04/2021: no ischemia   Chronic back pain    Chronic common bile duct dilatation    Cirrhosis (HCC)    a.) (+) hepatic lesion on CT; likely hepatocellular carcinoma; b.) CT evidence of associated portal hypertension; c.) (+) abdominopelvic anasarca   COPD (chronic obstructive pulmonary disease) (HCC)    DDD (degenerative disc disease), lumbar    Depression    Frequent falls    GERD (gastroesophageal reflux disease)    HBV (hepatitis B virus) infection    HCV (hepatitis C virus)    Hernia of anterior abdominal wall    History of 2019 novel coronavirus disease (COVID-19) 04/16/2020   Homelessness    a.) limited  on placement as patient is listed on [place] sex offender listing for exploitation of a minor (possession of child pornography); incarcerated 12 years (released 11/2017)   Hyperlipidemia    Hypertension    Infestation by bed bug 02/2022   Malingering    Migraine    Nephrolithiasis    Obesity    Portal hypertension (HCC)    PTSD (post-traumatic stress disorder)    a.) brief training in the [employer] at age 67; someone was killed in front of him   Pulmonary HTN (HCC)    a.) multiple CT scans desmontrating dilated PA c/w pHTN; b.) TTE 03/20/2018: PASP 42 mmHg   Sleep apnea    a.) no nocturnal PAP therapy   Sleep apnea    Stenosis of cervical spine with myelopathy (HCC)    Suicidal ideations    T2DM (type 2 diabetes mellitus) (HCC)    Thrombocytopenia (HCC)    a.) related to hepatic cirrhosis; spleen normal in size   TIA (transient ischemic attack)    TIA (transient ischemic attack) 09/30/2023

## 2024-03-10 NOTE — ED Notes (Signed)
 Pt provided with sandwich bag and beverage while awaiting transport.

## 2024-03-10 NOTE — ED Notes (Signed)
 Pt ABCs intact. RR even and unlabored. Pt in NAD. Bed in lowest locked position. Call bell in reach. Denies needs at this time. Foley draining well, no leaking.

## 2024-03-10 NOTE — ED Notes (Signed)
 Called to [facility] @ 511 am per RN AJ/[facility] [place]

## 2024-03-10 NOTE — ED Notes (Signed)
 [facility] transportation services arrived to pick pt up. At bedside, pt requested to have foley bag switched out with leg bag so he doesn't have to carry a large bag around. RN quickly switched out to leg bag and secured to pts R leg. Pt happy with fit. RN then assisted in gathering pts belongings and pt provided with additional leg bags. Pt ABCs intact. RR even and unlabored. Pt in NAD. Pt denies further needs at this time. [facility] denies any further needs.

## 2024-03-10 NOTE — ED Notes (Addendum)
 Patience, [facility], called and made aware of patient transport to housing.

## 2024-03-10 NOTE — ED Notes (Addendum)
 RN called pt facility, [facility] and spoke with Patience, who is trying to get in contact with [name] Later, the caregiver and driver for the pt. No answer from [facility] x2.

## 2024-03-10 NOTE — ED Notes (Signed)
 Foley catheter insertion went well. Urine return observed and tubing secured to pt R leg. Pt then assisted with putting brief and pants back on.

## 2024-03-10 NOTE — ED Notes (Signed)
 RN called facility and spoke with Caregiver, Patience. Report was given to her. RN attempting to arrange transportation for pt to facility via [employer].

## 2024-03-10 NOTE — ED Notes (Signed)
 [name] Later called at [phone]; No answer x1.

## 2024-03-23 ENCOUNTER — Other Ambulatory Visit: Payer: Self-pay | Admitting: Internal Medicine

## 2024-03-23 DIAGNOSIS — K7460 Unspecified cirrhosis of liver: Secondary | ICD-10-CM

## 2024-03-25 ENCOUNTER — Encounter: Payer: Self-pay | Admitting: Neurosurgery

## 2024-03-27 ENCOUNTER — Ambulatory Visit (INDEPENDENT_AMBULATORY_CARE_PROVIDER_SITE_OTHER): Admitting: Urology

## 2024-03-27 VITALS — BP 163/82 | HR 44

## 2024-03-27 DIAGNOSIS — R339 Retention of urine, unspecified: Secondary | ICD-10-CM

## 2024-03-29 NOTE — Progress Notes (Signed)
 I, [name], acting as a scribe for Drew JAYSON Barba, MD., have documented all relevant documentation on the behalf of Drew JAYSON Barba, MD, as directed by Drew JAYSON Barba, MD while in the presence of Drew JAYSON Barba, MD.  03/27/2024 2:16 PM   [name] 02/26/1951 [id number]  Referring provider: Tobie Domino, MD [address] [place],  [place] [address]  Chief Complaint  Patient presents with   Establish Care    Urinary outflow obstruction     HPI: [name] is a 73 y.o. male referred for evaluation of urinary retention.   Past medical history significant for advanced hepatic cirrhosis and recurrent hepatic encephalopathy with several admissions.  Chronic indwelling Foley catheter at least since the Spring of 2025. Does not appear he has had a voiding trial.  On Aldactone  and states he produces a significant amount of urine in a 24-hour period; does not desire a voiding trial. Currently resides at a group home.   PMH: Past Medical History:  Diagnosis Date   (HFpEF) heart failure with preserved ejection fraction (HCC)    a.) TTE 03/20/2018: EF 60-65%, no RWMAs, mild LAE, mild-mod MR, PASP 42; b.) TTE 03/31/2021: EF 55-60%, no RWMAs, mild LVH, mild LAE, AoV sclerosis without stenosis; c.) TTE 03/25/2022: EF 60-65%, no RWMAs, AoV sclerosis without stenosis; d.) TTE 12/24/2022: EF 60-65%, no RWMAs, mild LVH, mild LAE.   Anasarca    Anemia    Anxiety    Aortic atherosclerosis (HCC)    Arthritis    Asthma    Bacteremia due to Escherichia coli 05/2023   Bacteremia due to Klebsiella pneumoniae 2023   Bilateral carotid artery disease (HCC)    Bradycardia    CAD (coronary artery disease) 10/21/2006   a.) MV 10/21/2006: small reversible defect involving the apical  lateral wall c/w possible ischemia; b.) MV: 03/24/2018: no ischemia; c.) MV 10/04/2021: LAD calcifications   CAD (coronary artery disease)    Cervical radiculopathy    Chest pain    a.) MV  03/21/2018: no ischemia; b.) MV 10/04/2021: no ischemia   Chronic back pain    Chronic common bile duct dilatation    Cirrhosis (HCC)    a.) (+) hepatic lesion on CT; likely hepatocellular carcinoma; b.) CT evidence of associated portal hypertension; c.) (+) abdominopelvic anasarca   COPD (chronic obstructive pulmonary disease) (HCC)    DDD (degenerative disc disease), lumbar    Depression    Frequent falls    GERD (gastroesophageal reflux disease)    HBV (hepatitis B virus) infection    HCV (hepatitis C virus)    Hernia of anterior abdominal wall    History of 2019 novel coronavirus disease (COVID-19) 04/16/2020   Homelessness    a.) limited on placement as patient is listed on [place] sex offender listing for exploitation of a minor (possession of child pornography); incarcerated 12 years (released 11/2017)   Hyperlipidemia    Hypertension    Infestation by bed bug 02/2022   Malingering    Migraine    Nephrolithiasis    Obesity    Portal hypertension (HCC)    PTSD (post-traumatic stress disorder)    a.) brief training in the [employer] at age 89; someone was killed in front of him   Pulmonary HTN (HCC)    a.) multiple CT scans desmontrating dilated PA c/w pHTN; b.) TTE 03/20/2018: PASP 42 mmHg   Sleep apnea    a.) no nocturnal PAP therapy  Sleep apnea    Stenosis of cervical spine with myelopathy (HCC)    Suicidal ideations    T2DM (type 2 diabetes mellitus) (HCC)    Thrombocytopenia (HCC)    a.) related to hepatic cirrhosis; spleen normal in size   TIA (transient ischemic attack)    TIA (transient ischemic attack) 09/30/2023    Surgical History: Past Surgical History:  Procedure Laterality Date   ANTERIOR CERVICAL DECOMP/DISCECTOMY FUSION N/A 07/01/2023   Procedure: ANTERIOR CERVICAL DISCECTOMY AND FUSION (FORGE);  Surgeon: Clois Fret, MD;  Location: [facility] ORS;  Service: Neurosurgery;  Laterality: N/A;  Keep as last case of the day per Dr Clois   ANTERIOR CERVICAL  DISCECTOMY     APPENDECTOMY     CARDIAC CATHETERIZATION     CHOLECYSTECTOMY     EYE SURGERY     INNER EAR SURGERY     NOSE SURGERY     XI ROBOTIC LAPAROSCOPIC ASSISTED APPENDECTOMY N/A 11/05/2022   Procedure: XI ROBOTIC LAPAROSCOPIC ASSISTED APPENDECTOMY;  Surgeon: Rodolph Romano, MD;  Location: [facility] ORS;  Service: General;  Laterality: N/A;    Home Medications:  Allergies as of 03/27/2024       Reactions   Bee Venom Anaphylaxis   Codeine  Itching   Only itching. Recently allergy tested at [facility]   Rsv Mrna Pre-f Virus Vaccine Swelling   Meloxicam    Other Reaction(s): ?overdose of local given at dentist--had trouble breathing   Codeine     Novocain [procaine]    Sulfa Antibiotics Other (See Comments)   Sulfa Antibiotics    hallucinations        Medication List        Accurate as of March 27, 2024 11:59 PM. If you have any questions, ask your nurse or doctor.          acetaminophen  650 MG CR tablet Commonly known as: TYLENOL  Take 650 mg by mouth every 8 (eight) hours as needed for pain.   aspirin  81 MG chewable tablet Chew 81 mg by mouth daily.   atorvastatin  40 MG tablet Commonly known as: LIPITOR Take 40 mg by mouth daily.   citalopram  20 MG tablet Commonly known as: CELEXA  Take 20 mg by mouth daily.   feeding supplement Liqd Take 237 mLs by mouth 3 (three) times daily between meals.   furosemide  20 MG tablet Commonly known as: LASIX  Take 2 tablets (40 mg total) by mouth daily.   gabapentin  300 MG capsule Commonly known as: NEURONTIN  Take 300 mg by mouth 2 (two) times daily.   lactulose  10 GM/15ML solution Commonly known as: CHRONULAC  Take 67.5 mLs (45 g total) by mouth 3 (three) times daily. Please titrate to have 2-3 soft bowel movements daily   methocarbamol  500 MG tablet Commonly known as: ROBAXIN  Take 500 mg by mouth 4 (four) times daily.   multivitamin with minerals Tabs tablet Take 1 tablet by mouth daily.   Na Sulfate-K Sulfate-Mg  Sulfate concentrate 17.5-3.13-1.6 GM/177ML Soln Commonly known as: SUPREP Take 1 Bottle by mouth.   nitroGLYCERIN  0.4 MG/SPRAY spray Commonly known as: NITROLINGUAL  Place 1 spray under the tongue every 5 (five) minutes x 3 doses as needed for chest pain.   nystatin cream Commonly known as: MYCOSTATIN Apply 1 Application topically 3 (three) times daily.   ondansetron  4 MG tablet Commonly known as: ZOFRAN  Take 1 tablet (4 mg total) by mouth every 6 (six) hours as needed for nausea.   ondansetron  8 MG disintegrating tablet Commonly known as: ZOFRAN -ODT Take 8  mg by mouth.   pantoprazole  40 MG tablet Commonly known as: PROTONIX  Take 40 mg by mouth daily.   phenol 1.4 % Liqd Commonly known as: CHLORASEPTIC Use as directed 1 spray in the mouth or throat as needed for throat irritation / pain.   QUEtiapine  50 MG tablet Commonly known as: SEROQUEL  Take 50 mg by mouth at bedtime.   senna 8.6 MG Tabs tablet Commonly known as: SENOKOT Take 1 tablet (8.6 mg total) by mouth 2 (two) times daily as needed for mild constipation.   spironolactone  25 MG tablet Commonly known as: ALDACTONE  Take 1 tablet (25 mg total) by mouth daily.   Ventolin  HFA 108 (90 Base) MCG/ACT inhaler Generic drug: albuterol  Inhale 2 puffs into the lungs every 4 (four) hours as needed for wheezing.   albuterol  (2.5 MG/3ML) 0.083% nebulizer solution Commonly known as: PROVENTIL  Take 2.5 mg by nebulization every 4 (four) hours as needed for wheezing or shortness of breath.   Xifaxan  550 MG Tabs tablet Generic drug: rifaximin  Take 550 mg by mouth 2 (two) times daily.        Allergies:  Allergies  Allergen Reactions   Bee Venom Anaphylaxis   Codeine  Itching    Only itching. Recently allergy tested at [facility]   Rsv Mrna Pre-F Virus Vaccine Swelling   Meloxicam     Other Reaction(s): ?overdose of local given at dentist--had trouble breathing   Codeine     Novocain [Procaine]    Sulfa Antibiotics Other  (See Comments)   Sulfa Antibiotics     hallucinations    Family History: Family History  Problem Relation Age of Onset   Hypertension Mother    Heart disease Mother    Heart failure Maternal Grandmother     Social History:  reports that he has been smoking cigarettes. He has never used smokeless tobacco. He reports that he does not currently use alcohol. He reports that he does not currently use drugs.   Physical Exam: BP (!) 163/82   Pulse (!) 44   Constitutional:  Alert and oriented, No acute distress. HEENT: [place] AT, moist mucus membranes.  Trachea midline, no masses. Cardiovascular: No clubbing, cyanosis, or edema. Respiratory: Normal respiratory effort, no increased work of breathing. GI: Abdomen is soft, nontender, nondistended, no abdominal masses GU: Foley catheter draining clear urine.  Skin: No rashes, bruises or suspicious lesions. Neurologic: Grossly intact, no focal deficits, moving all 4 extremities. Psychiatric: Normal mood and affect.   Assessment & Plan:    1. Urinary retention Prostate volume calculated on CT 06/19/23 approximately 60 grams and retention most likely secondary to BPH.  We discussed a voiding trial, however based on his daily urine volume has declined Foley catheter removal.  We did discuss the potential side complications of chronic endourethral Foley catheter including recurrent UTIs and erythro catheter erosion.  Suprapubic tube placement by IR was discussed, and he is interested in pursuing.  Will schedule SPT placement by IR.  [facility] [address] [place], [place] [address] [phone]

## 2024-03-31 ENCOUNTER — Other Ambulatory Visit: Payer: Self-pay

## 2024-03-31 ENCOUNTER — Encounter: Payer: Self-pay | Admitting: Urology

## 2024-03-31 DIAGNOSIS — R339 Retention of urine, unspecified: Secondary | ICD-10-CM

## 2024-03-31 NOTE — Progress Notes (Signed)
 Will set up IR placement for SPT. Orders Entered.

## 2024-04-01 ENCOUNTER — Ambulatory Visit: Admitting: Anesthesiology

## 2024-04-01 ENCOUNTER — Encounter
Admission: RE | Disposition: A | Discharge status: Skilled Nursing Facility | Payer: Self-pay | Source: Home / Self Care | Attending: Internal Medicine

## 2024-04-01 ENCOUNTER — Ambulatory Visit
Admission: RE | Admit: 2024-04-01 | Discharge: 2024-04-01 | Disposition: A | Discharge status: Skilled Nursing Facility | Attending: Internal Medicine | Admitting: Internal Medicine

## 2024-04-01 ENCOUNTER — Encounter: Payer: Self-pay | Admitting: Internal Medicine

## 2024-04-01 DIAGNOSIS — R188 Other ascites: Secondary | ICD-10-CM | POA: Diagnosis not present

## 2024-04-01 DIAGNOSIS — K635 Polyp of colon: Secondary | ICD-10-CM | POA: Insufficient documentation

## 2024-04-01 DIAGNOSIS — I272 Pulmonary hypertension, unspecified: Secondary | ICD-10-CM | POA: Diagnosis not present

## 2024-04-01 DIAGNOSIS — Z8673 Personal history of transient ischemic attack (TIA), and cerebral infarction without residual deficits: Secondary | ICD-10-CM | POA: Diagnosis not present

## 2024-04-01 DIAGNOSIS — Z8619 Personal history of other infectious and parasitic diseases: Secondary | ICD-10-CM | POA: Diagnosis not present

## 2024-04-01 DIAGNOSIS — F32A Depression, unspecified: Secondary | ICD-10-CM | POA: Diagnosis not present

## 2024-04-01 DIAGNOSIS — K641 Second degree hemorrhoids: Secondary | ICD-10-CM | POA: Diagnosis not present

## 2024-04-01 DIAGNOSIS — I2510 Atherosclerotic heart disease of native coronary artery without angina pectoris: Secondary | ICD-10-CM | POA: Diagnosis not present

## 2024-04-01 DIAGNOSIS — E1151 Type 2 diabetes mellitus with diabetic peripheral angiopathy without gangrene: Secondary | ICD-10-CM | POA: Insufficient documentation

## 2024-04-01 DIAGNOSIS — J4489 Other specified chronic obstructive pulmonary disease: Secondary | ICD-10-CM | POA: Diagnosis not present

## 2024-04-01 DIAGNOSIS — Z1211 Encounter for screening for malignant neoplasm of colon: Secondary | ICD-10-CM | POA: Diagnosis present

## 2024-04-01 DIAGNOSIS — F4310 Post-traumatic stress disorder, unspecified: Secondary | ICD-10-CM | POA: Insufficient documentation

## 2024-04-01 DIAGNOSIS — K219 Gastro-esophageal reflux disease without esophagitis: Secondary | ICD-10-CM | POA: Diagnosis not present

## 2024-04-01 DIAGNOSIS — I110 Hypertensive heart disease with heart failure: Secondary | ICD-10-CM | POA: Diagnosis not present

## 2024-04-01 DIAGNOSIS — Z765 Malingerer [conscious simulation]: Secondary | ICD-10-CM | POA: Diagnosis not present

## 2024-04-01 DIAGNOSIS — G959 Disease of spinal cord, unspecified: Secondary | ICD-10-CM | POA: Diagnosis not present

## 2024-04-01 DIAGNOSIS — K3189 Other diseases of stomach and duodenum: Secondary | ICD-10-CM | POA: Diagnosis not present

## 2024-04-01 DIAGNOSIS — Z79899 Other long term (current) drug therapy: Secondary | ICD-10-CM | POA: Insufficient documentation

## 2024-04-01 DIAGNOSIS — K7210 Chronic hepatic failure without coma: Secondary | ICD-10-CM | POA: Insufficient documentation

## 2024-04-01 DIAGNOSIS — Z5901 Sheltered homelessness: Secondary | ICD-10-CM | POA: Diagnosis not present

## 2024-04-01 DIAGNOSIS — K766 Portal hypertension: Secondary | ICD-10-CM | POA: Insufficient documentation

## 2024-04-01 DIAGNOSIS — I5032 Chronic diastolic (congestive) heart failure: Secondary | ICD-10-CM | POA: Insufficient documentation

## 2024-04-01 DIAGNOSIS — K7460 Unspecified cirrhosis of liver: Secondary | ICD-10-CM | POA: Diagnosis not present

## 2024-04-01 DIAGNOSIS — R45851 Suicidal ideations: Secondary | ICD-10-CM | POA: Diagnosis not present

## 2024-04-01 DIAGNOSIS — D696 Thrombocytopenia, unspecified: Secondary | ICD-10-CM | POA: Diagnosis not present

## 2024-04-01 DIAGNOSIS — B1910 Unspecified viral hepatitis B without hepatic coma: Secondary | ICD-10-CM | POA: Insufficient documentation

## 2024-04-01 HISTORY — PX: ESOPHAGOGASTRODUODENOSCOPY: SHX5428

## 2024-04-01 HISTORY — PX: POLYPECTOMY: SHX149

## 2024-04-01 HISTORY — PX: COLONOSCOPY: SHX5424

## 2024-04-01 LAB — GLUCOSE, CAPILLARY: Glucose-Capillary: 67 mg/dL — ABNORMAL LOW (ref 70–99)

## 2024-04-01 SURGERY — COLONOSCOPY
Anesthesia: General

## 2024-04-01 MED ORDER — SODIUM CHLORIDE 0.9 % IV SOLN
INTRAVENOUS | Status: DC
  Administered 2024-04-01: 20 mL/h via INTRAVENOUS

## 2024-04-01 MED ORDER — DEXMEDETOMIDINE HCL IN NACL 80 MCG/20ML IV SOLN
INTRAVENOUS | Status: DC | PRN
  Administered 2024-04-01: 8 ug via INTRAVENOUS
  Administered 2024-04-01: 4 ug via INTRAVENOUS
  Administered 2024-04-01: 8 ug via INTRAVENOUS

## 2024-04-01 MED ORDER — EPHEDRINE SULFATE-NACL 50-0.9 MG/10ML-% IV SOSY
PREFILLED_SYRINGE | INTRAVENOUS | Status: DC | PRN
  Administered 2024-04-01: 15 mg via INTRAVENOUS
  Administered 2024-04-01: 10 mg via INTRAVENOUS

## 2024-04-01 MED ORDER — LIDOCAINE HCL (CARDIAC) PF 100 MG/5ML IV SOSY
PREFILLED_SYRINGE | INTRAVENOUS | Status: DC | PRN
  Administered 2024-04-01: 60 mg via INTRAVENOUS

## 2024-04-01 MED ORDER — PROPOFOL 10 MG/ML IV BOLUS
INTRAVENOUS | Status: DC | PRN
  Administered 2024-04-01: 20 mg via INTRAVENOUS
  Administered 2024-04-01: 30 mg via INTRAVENOUS
  Administered 2024-04-01: 50 mg via INTRAVENOUS

## 2024-04-01 MED ORDER — GLYCOPYRROLATE 0.2 MG/ML IJ SOLN
INTRAMUSCULAR | Status: DC | PRN
  Administered 2024-04-01: .2 mg via INTRAVENOUS

## 2024-04-01 MED ORDER — PROPOFOL 500 MG/50ML IV EMUL
INTRAVENOUS | Status: DC | PRN
  Administered 2024-04-01: 75 ug/kg/min via INTRAVENOUS

## 2024-04-01 NOTE — Anesthesia Preprocedure Evaluation (Signed)
 Anesthesia Evaluation  Patient identified by MRN, date of birth, ID band Patient awake    Reviewed: Allergy & Precautions, NPO status , Patient's Chart, lab work & pertinent test results  History of Anesthesia Complications Negative for: history of anesthetic complications  Airway Mallampati: III  TM Distance: >3 FB Neck ROM: full    Dental  (+) Poor Dentition   Pulmonary asthma , sleep apnea , COPD (rare inhaler use), Current Smoker, former smoker   Pulmonary exam normal        Cardiovascular Exercise Tolerance: Good hypertension, On Medications pulmonary hypertension+ CAD, + Peripheral Vascular Disease (BILATERAL carotid artery disease) and +CHF  + dysrhythmias (bradycardia)  Rhythm:Regular Rate:Bradycardia + Systolic murmurs  Most recent myocardial perfusion imaging study was performed on 10/04/2021 revealing a normal left ventricular systolic function with an EF of 58%.  There was no evidence of stress-induced myocardial ischemia or arrhythmia; no scintigraphic evidence of scar.  CT attenuation correction imaging revealed calcifications in the LAD.  Study determined to be low risk overall.    Most recent TTE was performed on 12/24/2022 revealing a normal left ventricular systolic function with an EF of 60 to 65%.  There were no regional wall motion abnormalities.  There was mild LVH.  Right ventricular size and function normal.  There was mild left atrial dilatation. All transvalvular gradients were noted to be normal providing no evidence suggestive of valvular stenosis. Aorta normal in size with no evidence of aneurysmal dilatation.     Neuro/Psych  PSYCHIATRIC DISORDERS  Depression    depression, suicidal ideations, malingering, PTSD, homelessness with recent placement in group homefrequent falls Cervical myelopathy TIA (pt does not recall this event)   GI/Hepatic ,GERD  ,,(+) Cirrhosis  (portal HTN)  ascites       Endo/Other  diabetes, Well Controlled, Type 2    Renal/GU      Musculoskeletal   Abdominal   Peds  Hematology  (+) Blood dyscrasia, anemia Thrombocytopenia     Anesthesia Other Findings Past Medical History: No date: Asthma No date: CHF (congestive heart failure) (HCC) No date: COPD (chronic obstructive pulmonary disease) (HCC) 03/2020: COVID-19     Comment:  diagnosed in August 2021 No date: Diabetes mellitus without complication (HCC) No date: Homelessness No date: Hypertension No date: Infestation by bed bug No date: Migraine No date: Obesity No date: Sleep apnea  Past Surgical History: No date: CARDIAC CATHETERIZATION No date: CHOLECYSTECTOMY No date: EYE SURGERY No date: INNER EAR SURGERY No date: NOSE SURGERY  BMI    Body Mass Index: 32.74 kg/m      Reproductive/Obstetrics negative OB ROS                              Anesthesia Physical Anesthesia Plan  ASA: 3  Anesthesia Plan: General   Post-op Pain Management: Minimal or no pain anticipated   Induction: Intravenous  PONV Risk Score and Plan: 2 and Propofol  infusion and TIVA  Airway Management Planned: Nasal Cannula  Additional Equipment: None  Intra-op Plan:   Post-operative Plan:   Informed Consent: I have reviewed the patients History and Physical, chart, labs and discussed the procedure including the risks, benefits and alternatives for the proposed anesthesia with the patient or authorized representative who has indicated his/her understanding and acceptance.     Dental advisory given  Plan Discussed with: CRNA and Surgeon  Anesthesia Plan Comments: (Discussed risks of anesthesia with patient, including possibility  of difficulty with spontaneous ventilation under anesthesia necessitating airway intervention, PONV, and rare risks such as cardiac or respiratory or neurological events, and allergic reactions. Discussed the role of CRNA in patient's  perioperative care. Patient understands.)        Anesthesia Quick Evaluation

## 2024-04-01 NOTE — Transfer of Care (Signed)
 Immediate Anesthesia Transfer of Care Note  Patient: [name]  Procedure(s) Performed: COLONOSCOPY EGD (ESOPHAGOGASTRODUODENOSCOPY) POLYPECTOMY, INTESTINE  Patient Location: PACU  Anesthesia Type:General  Level of Consciousness: sedated  Airway & Oxygen Therapy: Patient Spontanous Breathing  Post-op Assessment: Report given to RN and Post -op Vital signs reviewed and stable  Post vital signs: Reviewed and stable  Last Vitals:  Vitals Value Taken Time  BP 95/57 04/01/24 11:58  Temp    Pulse 78 04/01/24 11:59  Resp 22 04/01/24 11:59  SpO2 98 % 04/01/24 11:59  Vitals shown include unfiled device data.  Last Pain:  Vitals:   04/01/24 1048  TempSrc: Temporal  PainSc: 8          Complications: No notable events documented.

## 2024-04-01 NOTE — Op Note (Signed)
 [facility] Patient Name: [name] Procedure Date: 04/01/2024 10:38 AM MRN: [id number] Account #: [id number] Date of Birth: 1951/02/19 Admit Type: Outpatient Age: 73 Room: [facility] ENDO ROOM 2 Gender: Male Note Status: Finalized Instrument Name: Colonoscope [id number] Procedure:             Colonoscopy Indications:           Screening for colorectal malignant neoplasm Providers:             Ory Elting K. Libbie Bartley MD, MD Medicines:             Propofol  per Anesthesia Complications:         No immediate complications. Estimated blood loss:                         Minimal. Procedure:             Pre-Anesthesia Assessment:                        - The risks and benefits of the procedure and the                         sedation options and risks were discussed with the                         patient. All questions were answered and informed                         consent was obtained.                        - Patient identification and proposed procedure were                         verified prior to the procedure by the nurse. The                         procedure was verified in the procedure room.                        - ASA Grade Assessment: III - A patient with severe                         systemic disease.                        - After reviewing the risks and benefits, the patient                         was deemed in satisfactory condition to undergo the                         procedure.                        After obtaining informed consent, the colonoscope was                         passed under direct vision. Throughout the procedure,  the patient's blood pressure, pulse, and oxygen                         saturations were monitored continuously. The                         Colonoscope was introduced through the anus and                         advanced to the the cecum, identified by appendiceal                          orifice and ileocecal valve. The colonoscopy was                         performed without difficulty. The patient tolerated                         the procedure well. The quality of the bowel                         preparation was good. The ileocecal valve, appendiceal                         orifice, and rectum were photographed. Findings:      The perianal and digital rectal examinations were normal. Pertinent       negatives include normal sphincter tone and no palpable rectal lesions.      Non-bleeding internal hemorrhoids were found during retroflexion. The       hemorrhoids were Grade II (internal hemorrhoids that prolapse but reduce       spontaneously).      A 4 mm polyp was found in the transverse colon. The polyp was sessile.       The polyp was removed with a piecemeal technique using a cold biopsy       forceps. Resection and retrieval were complete. Estimated blood loss was       minimal.      The exam was otherwise without abnormality. Impression:            - Non-bleeding internal hemorrhoids.                        - One 4 mm polyp in the transverse colon, removed                         piecemeal using a cold biopsy forceps. Resected and                         retrieved.                        - The examination was otherwise normal. Recommendation:        - Patient has a contact number available for                         emergencies. The signs and symptoms of potential                         delayed complications were  discussed with the patient.                         Return to normal activities tomorrow. Written                         discharge instructions were provided to the patient.                        - Resume previous diet.                        - Continue present medications.                        - If polyps are benign or adenomatous without                         dysplasia, I will advise NO further colonoscopy due to                          advanced age and/or severe comorbidity.                        - Return to my office in 1 year.                        - The findings and recommendations were discussed with                         the patient. Procedure Code(s):     --- Professional ---                        [phone], Colonoscopy, flexible; with biopsy, single or                         multiple Diagnosis Code(s):     --- Professional ---                        K64.1, Second degree hemorrhoids                        D12.3, Benign neoplasm of transverse colon (hepatic                         flexure or splenic flexure)                        Z12.11, Encounter for screening for malignant neoplasm                         of colon CPT copyright 2022 American [employer] Association. All rights reserved. The codes documented in this report are preliminary and upon coder review may  be revised to meet current compliance requirements. Ladell MARLA Boss MD, MD 04/01/2024 11:57:54 AM This report has been signed electronically. Number of Addenda: 0 Note Initiated On: 04/01/2024 10:38 AM Scope Withdrawal Time: 0 hours 8 minutes 22 seconds  Total Procedure Duration: 0 hours 13 minutes 9 seconds  Estimated Blood Loss:  Estimated blood loss was minimal.  [facility]

## 2024-04-01 NOTE — Interval H&P Note (Signed)
 History and Physical Interval Note:  04/01/2024 10:40 AM  [name]  has presented today for surgery, with the diagnosis of Z12.11  - Colon cancer screening K76.6  - Portal venous hypertension.  The various methods of treatment have been discussed with the patient and family. After consideration of risks, benefits and other options for treatment, the patient has consented to  Procedure(s): COLONOSCOPY (N/A) EGD (ESOPHAGOGASTRODUODENOSCOPY) (N/A) as a surgical intervention.  The patient's history has been reviewed, patient examined, no change in status, stable for surgery.  I have reviewed the patient's chart and labs.  Questions were answered to the patient's satisfaction.     [name], [name]

## 2024-04-01 NOTE — H&P (Signed)
 Outpatient short stay form Pre-procedure 04/01/2024 9:58 AM [name] K. Aundria, M.D.  Primary Physician: [name], M.D. [facility]).  Reason for visit: Portal venous hypertension, colon cancer screening   History of present illness:  [name] presents today for gastrology consultation to help with management of end-stage liver disease and to address recent finding of a liver mass in the setting of cirrhosis. Patient does have a remote history of hepatitis C and says he took an antiviral several years ago which seemed to get rid of it completely. Patient says he recently found out that he had hepatitis B although he had tested negative in 2019. He denied any high risk behaviors. The patient has reported improved abdominal distention and peripheral edema since being placed on Lasix  40 mg daily and spironolactone  25 mg daily by his primary care provider, Dr. Blanch. Dr. Blanch has enlisted our help to assist in management of cirrhosis and to help with diuretic therapy. Patient also has history of hepatic encephalopathy as part of his end-stage liver disease for which she is taking lactulose  and Xifaxan . He is alert and oriented today in the office. He is nonambulatory and is in a wheelchair. He is company by his group home representative, [name]. Patient reports no previous screenings with endoscopy or colonoscopy. He denies any dysphagia, severe GERD or abdominal pain presently. He denies jaundice or excessive pruritus. Other than excessive diarrhea from taking lactulose , patient denies any change in bowel habits, hematochezia or melena. He denies hematemesis. He sleeps relatively well. Appetite appears normal. He is not strictly following a low-sodium diet. Patient at his advanced age has never undergone any colon cancer screening per his admission.    No current facility-administered medications for this encounter.  Current Outpatient Medications:     acetaminophen  (TYLENOL ) 650 MG CR tablet, Take 650 mg by mouth every 8 (eight) hours as needed for pain., Disp: , Rfl:    albuterol  (PROVENTIL ) (2.5 MG/3ML) 0.083% nebulizer solution, Take 2.5 mg by nebulization every 4 (four) hours as needed for wheezing or shortness of breath., Disp: , Rfl:    aspirin  81 MG chewable tablet, Chew 81 mg by mouth daily., Disp: , Rfl:    atorvastatin  (LIPITOR) 40 MG tablet, Take 40 mg by mouth daily., Disp: , Rfl:    citalopram  (CELEXA ) 20 MG tablet, Take 20 mg by mouth daily., Disp: , Rfl:    feeding supplement (ENSURE ENLIVE / ENSURE PLUS) LIQD, Take 237 mLs by mouth 3 (three) times daily between meals., Disp: 237 mL, Rfl: 12   furosemide  (LASIX ) 20 MG tablet, Take 2 tablets (40 mg total) by mouth daily., Disp: 30 tablet, Rfl: 0   gabapentin  (NEURONTIN ) 300 MG capsule, Take 300 mg by mouth 2 (two) times daily., Disp: , Rfl:    lactulose  (CHRONULAC ) 10 GM/15ML solution, Take 67.5 mLs (45 g total) by mouth 3 (three) times daily. Please titrate to have 2-3 soft bowel movements daily, Disp: 946 mL, Rfl: 10   methocarbamol  (ROBAXIN ) 500 MG tablet, Take 500 mg by mouth 4 (four) times daily., Disp: , Rfl:    Multiple Vitamin (MULTIVITAMIN WITH MINERALS) TABS tablet, Take 1 tablet by mouth daily., Disp: , Rfl:    Na Sulfate-K Sulfate-Mg Sulfate concentrate (SUPREP) 17.5-3.13-1.6 GM/177ML SOLN, Take 1 Bottle by mouth., Disp: , Rfl:    nitroGLYCERIN  (NITROLINGUAL ) 0.4 MG/SPRAY spray, Place 1 spray under the tongue every 5 (five) minutes x 3 doses as needed for chest pain., Disp: 12 g, Rfl:  5   nystatin cream (MYCOSTATIN), Apply 1 Application topically 3 (three) times daily., Disp: , Rfl:    ondansetron  (ZOFRAN ) 4 MG tablet, Take 1 tablet (4 mg total) by mouth every 6 (six) hours as needed for nausea., Disp: 20 tablet, Rfl: 0   ondansetron  (ZOFRAN -ODT) 8 MG disintegrating tablet, Take 8 mg by mouth., Disp: , Rfl:    pantoprazole  (PROTONIX ) 40 MG tablet, Take 40 mg by mouth  daily., Disp: , Rfl:    phenol (CHLORASEPTIC) 1.4 % LIQD, Use as directed 1 spray in the mouth or throat as needed for throat irritation / pain., Disp: , Rfl:    QUEtiapine  (SEROQUEL ) 50 MG tablet, Take 50 mg by mouth at bedtime., Disp: , Rfl:    senna (SENOKOT) 8.6 MG TABS tablet, Take 1 tablet (8.6 mg total) by mouth 2 (two) times daily as needed for mild constipation., Disp: 60 tablet, Rfl: 0   spironolactone  (ALDACTONE ) 25 MG tablet, Take 1 tablet (25 mg total) by mouth daily., Disp: 30 tablet, Rfl: 1   VENTOLIN  HFA 108 (90 Base) MCG/ACT inhaler, Inhale 2 puffs into the lungs every 4 (four) hours as needed for wheezing., Disp: , Rfl:    XIFAXAN  550 MG TABS tablet, Take 550 mg by mouth 2 (two) times daily., Disp: , Rfl:   No medications prior to admission.     Allergies  Allergen Reactions   Bee Venom Anaphylaxis   Codeine  Itching    Only itching. Recently allergy tested at [facility] Pre-F Virus Vaccine Swelling   Meloxicam     Other Reaction(s): ?overdose of local given at dentist--had trouble breathing   Codeine     Novocain [Procaine]    Sulfa Antibiotics Other (See Comments)   Sulfa Antibiotics     hallucinations     Past Medical History:  Diagnosis Date   (HFpEF) heart failure with preserved ejection fraction (HCC)    a.) TTE 03/20/2018: EF 60-65%, no RWMAs, mild LAE, mild-mod MR, PASP 42; b.) TTE 03/31/2021: EF 55-60%, no RWMAs, mild LVH, mild LAE, AoV sclerosis without stenosis; c.) TTE 03/25/2022: EF 60-65%, no RWMAs, AoV sclerosis without stenosis; d.) TTE 12/24/2022: EF 60-65%, no RWMAs, mild LVH, mild LAE.   Anasarca    Anemia    Anxiety    Aortic atherosclerosis (HCC)    Arthritis    Asthma    Bacteremia due to Escherichia coli 05/2023   Bacteremia due to Klebsiella pneumoniae 2023   Bilateral carotid artery disease (HCC)    Bradycardia    CAD (coronary artery disease) 10/21/2006   a.) MV 10/21/2006: small reversible defect involving the apical  lateral  wall c/w possible ischemia; b.) MV: 03/24/2018: no ischemia; c.) MV 10/04/2021: LAD calcifications   CAD (coronary artery disease)    Cervical radiculopathy    Chest pain    a.) MV 03/21/2018: no ischemia; b.) MV 10/04/2021: no ischemia   Chronic back pain    Chronic common bile duct dilatation    Cirrhosis (HCC)    a.) (+) hepatic lesion on CT; likely hepatocellular carcinoma; b.) CT evidence of associated portal hypertension; c.) (+) abdominopelvic anasarca   COPD (chronic obstructive pulmonary disease) (HCC)    DDD (degenerative disc disease), lumbar    Depression    Frequent falls    GERD (gastroesophageal reflux disease)    HBV (hepatitis B virus) infection    HCV (hepatitis C virus)    Hernia of anterior abdominal wall    History of  2019 novel coronavirus disease (COVID-19) 04/16/2020   Homelessness    a.) limited on placement as patient is listed on [place] sex offender listing for exploitation of a minor (possession of child pornography); incarcerated 12 years (released 11/2017)   Hyperlipidemia    Hypertension    Infestation by bed bug 02/2022   Malingering    Migraine    Nephrolithiasis    Obesity    Portal hypertension (HCC)    PTSD (post-traumatic stress disorder)    a.) brief training in [employer] at age 57; someone was killed in front of him   Pulmonary HTN (HCC)    a.) multiple CT scans desmontrating dilated PA c/w pHTN; b.) TTE 03/20/2018: PASP 42 mmHg   Sleep apnea    a.) no nocturnal PAP therapy   Sleep apnea    Stenosis of cervical spine with myelopathy (HCC)    Suicidal ideations    T2DM (type 2 diabetes mellitus) (HCC)    Thrombocytopenia (HCC)    a.) related to hepatic cirrhosis; spleen normal in size   TIA (transient ischemic attack)    TIA (transient ischemic attack) 09/30/2023    Review of systems:  Otherwise negative.    Physical Exam  Gen: Alert, oriented. Appears stated age.  HEENT: [place]/AT. PERRLA. Lungs: CTA, no wheezes. CV: RR nl S1,  S2. Abd: soft, benign, no masses. BS+ Ext: No edema. Pulses 2+    Planned procedures: Proceed with EGD and colonoscopy. The patient understands the nature of the planned procedure, indications, risks, alternatives and potential complications including but not limited to bleeding, infection, perforation, damage to internal organs and possible oversedation/side effects from anesthesia. The patient agrees and gives consent to proceed.  Please refer to procedure notes for findings, recommendations and patient disposition/instructions.     Tunis Gentle K. Aundria, M.D. Gastroenterology 04/01/2024  9:58 AM

## 2024-04-01 NOTE — Op Note (Signed)
 [facility] Gastroenterology Patient Name: [name] Procedure Date: 04/01/2024 10:37 AM MRN: [id number] Account #: [id number] Date of Birth: 16-Jul-1951 Admit Type: Outpatient Age: 73 Room: [facility] ENDO ROOM 2 Gender: Male Note Status: Finalized Instrument Name: Upper Endoscope [id number] Procedure:             Upper GI endoscopy Indications:           Portal venous hypertension Providers:             Jerrad Mendibles K. Adriano Bischof MD, MD Medicines:             Propofol  per Anesthesia Complications:         No immediate complications. Estimated blood loss: None. Procedure:             Pre-Anesthesia Assessment:                        - The risks and benefits of the procedure and the                         sedation options and risks were discussed with the                         patient. All questions were answered and informed                         consent was obtained.                        - Patient identification and proposed procedure were                         verified prior to the procedure by the nurse. The                         procedure was verified in the procedure room.                        - ASA Grade Assessment: III - A patient with severe                         systemic disease.                        - After reviewing the risks and benefits, the patient                         was deemed in satisfactory condition to undergo the                         procedure.                        After obtaining informed consent, the endoscope was                         passed under direct vision. Throughout the procedure,                         the patient's blood pressure, pulse, and oxygen  saturations were monitored continuously. The Endoscope                         was introduced through the mouth, and advanced to the                         third part of duodenum. The upper GI endoscopy was                         accomplished  without difficulty. The patient tolerated                         the procedure well. Findings:      The esophagus was normal.      Mild portal hypertensive gastropathy was found in the entire examined       stomach.      There is no endoscopic evidence of hiatal hernia, ulceration or varices       in the entire examined stomach.      The examined duodenum was normal. Impression:            - Normal esophagus.                        - Portal hypertensive gastropathy.                        - Normal examined duodenum.                        - No specimens collected. Recommendation:        - Repeat upper endoscopy in 2 years for surveillance.                        - Proceed with colonoscopy Procedure Code(s):     --- Professional ---                        [phone], Esophagogastroduodenoscopy, flexible,                         transoral; diagnostic, including collection of                         specimen(s) by brushing or washing, when performed                         (separate procedure) Diagnosis Code(s):     --- Professional ---                        K31.89, Other diseases of stomach and duodenum                        K76.6, Portal hypertension CPT copyright 2022 American Medical Association. All rights reserved. The codes documented in this report are preliminary and upon coder review may  be revised to meet current compliance requirements. Ladell MARLA Boss MD, MD 04/01/2024 11:36:58 AM This report has been signed electronically. Number of Addenda: 0 Note Initiated On: 04/01/2024 10:37 AM Estimated Blood Loss:  Estimated blood loss: none.      [facility]

## 2024-04-02 LAB — SURGICAL PATHOLOGY

## 2024-04-02 NOTE — Anesthesia Postprocedure Evaluation (Signed)
 Anesthesia Post Note  Patient: [name]  Procedure(s) Performed: COLONOSCOPY EGD (ESOPHAGOGASTRODUODENOSCOPY) POLYPECTOMY, INTESTINE  Patient location during evaluation: Endoscopy Anesthesia Type: General Level of consciousness: awake and alert Pain management: pain level controlled Vital Signs Assessment: post-procedure vital signs reviewed and stable Respiratory status: spontaneous breathing, nonlabored ventilation, respiratory function stable and patient connected to nasal cannula oxygen Cardiovascular status: blood pressure returned to baseline and stable Postop Assessment: no apparent nausea or vomiting Anesthetic complications: no   No notable events documented.   Last Vitals:  Vitals:   04/01/24 1205 04/01/24 1227  BP:  (!) 107/52  Pulse:  74  Resp:    Temp: (!) 35.9 C   SpO2:      Last Pain:  Vitals:   04/02/24 0718  TempSrc:   PainSc: 0-No pain                 Debby Mines

## 2024-04-03 ENCOUNTER — Encounter: Admitting: Family

## 2024-04-04 ENCOUNTER — Other Ambulatory Visit: Payer: Self-pay

## 2024-04-04 ENCOUNTER — Emergency Department
Admission: EM | Admit: 2024-04-04 | Discharge: 2024-04-04 | Disposition: A | Discharge status: Home / Self Care | Attending: Emergency Medicine | Admitting: Emergency Medicine

## 2024-04-04 ENCOUNTER — Encounter: Payer: Self-pay | Admitting: Emergency Medicine

## 2024-04-04 DIAGNOSIS — I509 Heart failure, unspecified: Secondary | ICD-10-CM | POA: Insufficient documentation

## 2024-04-04 DIAGNOSIS — J449 Chronic obstructive pulmonary disease, unspecified: Secondary | ICD-10-CM | POA: Diagnosis not present

## 2024-04-04 DIAGNOSIS — T83091A Other mechanical complication of indwelling urethral catheter, initial encounter: Secondary | ICD-10-CM | POA: Diagnosis present

## 2024-04-04 DIAGNOSIS — E119 Type 2 diabetes mellitus without complications: Secondary | ICD-10-CM | POA: Insufficient documentation

## 2024-04-04 DIAGNOSIS — I110 Hypertensive heart disease with heart failure: Secondary | ICD-10-CM | POA: Insufficient documentation

## 2024-04-04 DIAGNOSIS — Y828 Other medical devices associated with adverse incidents: Secondary | ICD-10-CM | POA: Insufficient documentation

## 2024-04-04 DIAGNOSIS — T839XXA Unspecified complication of genitourinary prosthetic device, implant and graft, initial encounter: Secondary | ICD-10-CM

## 2024-04-04 NOTE — ED Provider Notes (Signed)
 [facility] Emergency Department Provider Note     Event Date/Time   First MD Initiated Contact with Patient 04/04/24 1627     (approximate)   History   foley bag leaking   HPI  [name] is a 73 y.o. male with a history of HTN, DM type II, COPD, obesity, CHF, asthma, and acute urinary retention, presents to the ED from his care facility.  Patient presents from an adult family home, via EMS with reports of his catheter bag leaking.  He denies a loose or leaking Foley at the urethra, instead noting when the bag is full, there is urine leaking from one of the corners.  Patient denies any other complaint at this time.  Chart review reveals the patient had a Foley catheter placed about 8 days ago at [facility] urology.  Physical Exam   Triage Vital Signs: ED Triage Vitals  Encounter Vitals Group     BP 04/04/24 1634 118/63     Girls Systolic BP Percentile --      Girls Diastolic BP Percentile --      Boys Systolic BP Percentile --      Boys Diastolic BP Percentile --      Pulse Rate 04/04/24 1634 (!) 42     Resp 04/04/24 1634 18     Temp 04/04/24 1634 98.7 F (37.1 C)     Temp src --      SpO2 04/04/24 1634 98 %     Weight 04/04/24 1633 207 lb 3.7 oz (94 kg)     Height 04/04/24 1633 5' 6 (1.676 m)     Head Circumference --      Peak Flow --      Pain Score 04/04/24 1632 0     Pain Loc --      Pain Education --      Exclude from Growth Chart --     Most recent vital signs: Vitals:   04/04/24 1634  BP: 118/63  Pulse: (!) 42  Resp: 18  Temp: 98.7 F (37.1 C)  SpO2: 98%    General Awake, no distress. NAD HEENT NCAT. PERRL. EOMI. No rhinorrhea. Mucous membranes are moist.  CV:  Good peripheral perfusion.  RESP:  Normal effort.  ABD:  No distention.  GU:  Foley catheter in place with cloudy yellow urine noted in the tube draining into the bag without difficulty.  No active leaking from the Foley bag.   ED Results / Procedures /  Treatments   Labs (all labs ordered are listed, but only abnormal results are displayed) Labs Reviewed - No data to display   EKG    RADIOLOGY  No results found.   PROCEDURES:  Critical Care performed: No  Procedures   MEDICATIONS ORDERED IN ED: Medications - No data to display   IMPRESSION / MDM / ASSESSMENT AND PLAN / ED COURSE  I reviewed the triage vital signs and the nursing notes.                              Differential diagnosis includes, but is not limited to, Foley dysfunction, Foley blockage, urinary retention  Patient's presentation is most consistent with acute, uncomplicated illness.  Patient's diagnosis is consistent with Foley catheter dysfunction.  Patient presents, endorsing leaking from the Foley bag with no reports of any malfunction of the Foley catheter itself.  Patient's exam is overall benign and reassuring.  The back will be replaced at the patient's request.  patient is to follow up with his PCP or urologist as scheduled, as needed or otherwise directed. Patient is given ED precautions to return to the ED for any worsening or new symptoms.   FINAL CLINICAL IMPRESSION(S) / ED DIAGNOSES   Final diagnoses:  Foley catheter problem, initial encounter [facility])     Rx / DC Orders   ED Discharge Orders     None        Note:  This document was prepared using Dragon voice recognition software and may include unintentional dictation errors.    Loyd Candida LULLA Aldona, PA-C 04/04/24 1729    Levander Slate, MD 04/04/24 [phone]

## 2024-04-04 NOTE — ED Triage Notes (Signed)
 Pt to ER from adult family home by EMS for reports of catheter bag leaking.  No other c/o at this time.

## 2024-04-04 NOTE — Discharge Instructions (Signed)
 We changed your Foley catheter bag as requested.  Follow-up with urology as scheduled.

## 2024-04-08 ENCOUNTER — Telehealth: Payer: Self-pay | Admitting: Family

## 2024-04-08 NOTE — Telephone Encounter (Signed)
 Called to confirm/remind patient of [facility] on 04[facility].   Appointment:   [x] Confirmed  [] Left mess   [] No answer/No voice mail  [] VM Full/unable to leave message  [] Phone not in service  Patient reminded to bring all medications and/or complete list.  Confirmed patient has transportation. Gave directions, instructed to utilize valet parking.

## 2024-04-08 NOTE — Progress Notes (Signed)
 Advanced Heart Failure Clinic Note    PCP: [facility]  Primary Cardiologist: Perla Lye, MD   Chief Complaint: fatigue   HPI:  [name] is a 73 y/o male with a history of T2DM, HTN, COPD, migraines, hepatitis C, w/ likely underlying HCC, cirrhosis, TIA, depression, anxiety obstructive sleep apnea, cervical stenosis status post C3-6 ACDF, hyperlipidemia, CKD, chronic foley and chronic heart failure.  Echo 04/01/21: EF of 55-60% along with mild LVH/ LAE. Echo 03/25/22: EF 60-65% Echo 12/24/22: EF 60-65% along with mild LVH, mild LAE.    Admitted 06/17/23 due to dysuria, was found to have E. coli bacteremia. Having some falls, patient having abdominal pain and weight loss. Abd/ pelvic CT stable. MRI of cervical spine shows C4-5 severe spinal stenosis with cord compression. Neurosurgery consulted. Antibiotics provided.  Admitted 07/01/23 due to cervical myelopathy and cervical stenosis status post C3-6 ACDF. He was admitted overnight for pain control, therapy evaluation, and drain output monitoring. His postoperative course was complicated by worsening thrombocytopenia which improved back to his baseline with 1 unit of platelets. His drain output reduced to an acceptable level and was removed on the afternoon of postop day 1 and discharged the following day.   Admitted 07/29/23 due to AMS and a fall. Had not been taking his lactulose  due to diarrhea & generalized weakness. CXR normal. Head CT normal. Palliative care consulted due to multiple comorbidities. PT consulted. Mental status improved and discharged to group home. 2 December ED visits due to weakness/ tremors.   Admitted 10/21/23 with acute onset of AMS thought to be related to hepatic encephalopathy. Ammonia 185 -> 25 with improvement of mental status. Daily oral protein recommended. Lactulose  to be titrated for 2-3 soft stools daily. Losartan  & spironolactone  resumed. EKG while admitted showed junctional  bradycardia.  Since being seen in [facility] 03/25, he has been in the ED twice and admitted 6 times. Most recent admission was 01/11/24 with AMS due to hepatic encephalopathy after not having BM for 2 days. Ammonia level was 82=>90. Provider noted extensive education to patient and group home caregiver about titrating lactulose  to obtain 2-3 bowel movements daily.     Admitted 02/17/24 after falling out of bed at his care home. Developed fever of 103.3 in the ED. UA + large leukocytes. Repeat lactic acid rose to 9.0. Blood cultures obtained. Given IV antibiotics and IVF. Stay was complicated by AKI which resulted in diuretics being held with improved of kidney function.   4 ED visits due to scrotal swelling/ foley catheter problems with the most recent being 04/04/24.  He presents today for a HF follow-up visit with a chief complaint of fatigue. Not sleeping well due to anxiety.. Denies any shortness of breath, chest pain, dizziness, edema or weight gain. Continues to have a foley catheter although it is currently leaking. He says that when he rolls over in the bed, he occasionally dislodges it unintentionally.    ROS: All systems negative except as listed in HPI, PMH and Problem List.  SH:  Social History   Socioeconomic History   Marital status: Single    Spouse name: Not on file   Number of children: 2   Years of education: 12   Highest education level: 12th grade  Occupational History   Occupation: retired  Tobacco Use   Smoking status: Every Day    Types: Cigarettes   Smokeless tobacco: Never  Vaping Use   Vaping status: Not on file  Substance and Sexual Activity  Alcohol use: Not Currently    Comment: unknown   Drug use: Not Currently   Sexual activity: Not Currently    Birth control/protection: None  Other Topics Concern   Not on file  Social History Narrative   ** Merged History Encounter **       Lives in [facility] care home   Social Drivers of Health   Financial Resource  Strain: High Risk (04/27/2022)   Overall Financial Resource Strain (CARDIA)    Difficulty of Paying Living Expenses: Very hard  Food Insecurity: No Food Insecurity (02/17/2024)   Hunger Vital Sign    Worried About Running Out of Food in the Last Year: Never true    Ran Out of Food in the Last Year: Never true  Transportation Needs: No Transportation Needs (02/17/2024)   PRAPARE - Administrator, Civil Service (Medical): No    Lack of Transportation (Non-Medical): No  Physical Activity: Sufficiently Active (04/16/2018)   Exercise Vital Sign    Days of Exercise per Week: 7 days    Minutes of Exercise per Session: 30 min  Stress: Stress Concern Present (04/16/2018)   [employer] of Occupational Health - Occupational Stress Questionnaire    Feeling of Stress : Very much  Social Connections: Socially Isolated (02/17/2024)   Social Connection and Isolation Panel    Frequency of Communication with Friends and Family: Never    Frequency of Social Gatherings with Friends and Family: Never    Attends Religious Services: 1 to 4 times per year    Active Member of [employer] or Organizations: No    Attends Banker Meetings: Never    Marital Status: Widowed  Intimate Partner Violence: Unknown (02/17/2024)   [name], Afraid, [name], and Kick questionnaire    Fear of Current or Ex-Partner: Patient unable to answer    Emotionally Abused: Patient unable to answer    Physically Abused: No    Sexually Abused: No    FH:  Family History  Problem Relation Age of Onset   Hypertension Mother    Heart disease Mother    Heart failure Maternal Grandmother     Past Medical History:  Diagnosis Date   (HFpEF) heart failure with preserved ejection fraction (HCC)    a.) TTE 03/20/2018: EF 60-65%, no RWMAs, mild LAE, mild-mod Drew, PASP 42; b.) TTE 03/31/2021: EF 55-60%, no RWMAs, mild LVH, mild LAE, AoV sclerosis without stenosis; c.) TTE 03/25/2022: EF 60-65%, no RWMAs, AoV sclerosis  without stenosis; d.) TTE 12/24/2022: EF 60-65%, no RWMAs, mild LVH, mild LAE.   Anasarca    Anemia    Anxiety    Aortic atherosclerosis (HCC)    Arthritis    Asthma    Bacteremia due to Escherichia coli 05/2023   Bacteremia due to Klebsiella pneumoniae 2023   Bilateral carotid artery disease (HCC)    Bradycardia    CAD (coronary artery disease) 10/21/2006   a.) MV 10/21/2006: small reversible defect involving the apical  lateral wall c/w possible ischemia; b.) MV: 03/24/2018: no ischemia; c.) MV 10/04/2021: LAD calcifications   CAD (coronary artery disease)    Cervical radiculopathy    Chest pain    a.) MV 03/21/2018: no ischemia; b.) MV 10/04/2021: no ischemia   Chronic back pain    Chronic common bile duct dilatation    Cirrhosis (HCC)    a.) (+) hepatic lesion on CT; likely hepatocellular carcinoma; b.) CT evidence of associated portal hypertension; c.) (+) abdominopelvic anasarca   COPD (  chronic obstructive pulmonary disease) (HCC)    DDD (degenerative disc disease), lumbar    Depression    Frequent falls    GERD (gastroesophageal reflux disease)    HBV (hepatitis B virus) infection    HCV (hepatitis C virus)    Hernia of anterior abdominal wall    History of 2019 novel coronavirus disease (COVID-19) 04/16/2020   Homelessness    a.) limited on placement as patient is listed on [place] sex offender listing for exploitation of a minor (possession of child pornography); incarcerated 12 years (released 11/2017)   Hyperlipidemia    Hypertension    Infestation by bed bug 02/2022   Malingering    Migraine    Nephrolithiasis    Obesity    Portal hypertension (HCC)    PTSD (post-traumatic stress disorder)    a.) brief training in the [employer] at age 28; someone was killed in front of him   Pulmonary HTN (HCC)    a.) multiple CT scans desmontrating dilated PA c/w pHTN; b.) TTE 03/20/2018: PASP 42 mmHg   Sleep apnea    a.) no nocturnal PAP therapy   Sleep apnea    Stenosis of cervical  spine with myelopathy (HCC)    Suicidal ideations    T2DM (type 2 diabetes mellitus) (HCC)    Thrombocytopenia (HCC)    a.) related to hepatic cirrhosis; spleen normal in size   TIA (transient ischemic attack)    TIA (transient ischemic attack) 09/30/2023    Current Outpatient Medications  Medication Sig Dispense Refill   acetaminophen  (TYLENOL ) 650 MG CR tablet Take 650 mg by mouth every 8 (eight) hours as needed for pain.     albuterol  (PROVENTIL ) (2.5 MG/3ML) 0.083% nebulizer solution Take 2.5 mg by nebulization every 4 (four) hours as needed for wheezing or shortness of breath.     aspirin  81 MG chewable tablet Chew 81 mg by mouth daily.     atorvastatin  (LIPITOR) 40 MG tablet Take 40 mg by mouth daily.     citalopram  (CELEXA ) 20 MG tablet Take 20 mg by mouth daily.     feeding supplement (ENSURE ENLIVE / ENSURE PLUS) LIQD Take 237 mLs by mouth 3 (three) times daily between meals. 237 mL 12   furosemide  (LASIX ) 20 MG tablet Take 2 tablets (40 mg total) by mouth daily. 30 tablet 0   gabapentin  (NEURONTIN ) 300 MG capsule Take 300 mg by mouth 2 (two) times daily.     lactulose  (CHRONULAC ) 10 GM/15ML solution Take 67.5 mLs (45 g total) by mouth 3 (three) times daily. Please titrate to have 2-3 soft bowel movements daily 946 mL 10   methocarbamol  (ROBAXIN ) 500 MG tablet Take 500 mg by mouth 4 (four) times daily.     Multiple Vitamin (MULTIVITAMIN WITH MINERALS) TABS tablet Take 1 tablet by mouth daily.     Na Sulfate-K Sulfate-Mg Sulfate concentrate (SUPREP) 17.5-3.13-1.6 GM/177ML SOLN Take 1 Bottle by mouth.     nitroGLYCERIN  (NITROLINGUAL ) 0.4 MG/SPRAY spray Place 1 spray under the tongue every 5 (five) minutes x 3 doses as needed for chest pain. 12 g 5   nystatin cream (MYCOSTATIN) Apply 1 Application topically 3 (three) times daily.     ondansetron  (ZOFRAN ) 4 MG tablet Take 1 tablet (4 mg total) by mouth every 6 (six) hours as needed for nausea. 20 tablet 0   ondansetron  (ZOFRAN -ODT) 8  MG disintegrating tablet Take 8 mg by mouth.     pantoprazole  (PROTONIX ) 40 MG tablet Take 40 mg  by mouth daily.     phenol (CHLORASEPTIC) 1.4 % LIQD Use as directed 1 spray in the mouth or throat as needed for throat irritation / pain.     QUEtiapine  (SEROQUEL ) 50 MG tablet Take 50 mg by mouth at bedtime.     senna (SENOKOT) 8.6 MG TABS tablet Take 1 tablet (8.6 mg total) by mouth 2 (two) times daily as needed for mild constipation. 60 tablet 0   spironolactone  (ALDACTONE ) 25 MG tablet Take 1 tablet (25 mg total) by mouth daily. 30 tablet 1   VENTOLIN  HFA 108 (90 Base) MCG/ACT inhaler Inhale 2 puffs into the lungs every 4 (four) hours as needed for wheezing.     XIFAXAN  550 MG TABS tablet Take 550 mg by mouth 2 (two) times daily.     No current facility-administered medications for this visit.   Vitals:   04/09/24 1058  BP: 111/72  Pulse: 60  SpO2: 99%  Weight: 200 lb (90.7 kg)   Wt Readings from Last 3 Encounters:  04/09/24 200 lb (90.7 kg)  04/04/24 207 lb 3.7 oz (94 kg)  04/01/24 208 lb (94.3 kg)   Lab Results  Component Value Date   CREATININE 1.18 03/05/2024   CREATININE 1.13 02/22/2024   CREATININE 1.45 (H) 02/20/2024    PHYSICAL EXAM:  General: Well appearing.  Cor: No JVD. Regular rhythm, rate.  Lungs: clear Abdomen: soft, nontender, nondistended. Foley intact although is leaking. Caregiver with him will address this upon return to group home.  Extremities: no edema Neuro:. Affect pleasant   ECG: not done   ASSESSMENT & PLAN:  1: NICM with preserved ejection fraction- - likely due to HTN/ liver disease - NYHA class II - euvolemic - weight down 18 pounds from last visit here 2 months ago. Reports having a good appetite - Echo 03/20/18: EF of 60-65% along with mild/moderate Drew and a mildly elevated PA pressure of 42 mm Hg - Echo 04/01/21: EF of 55-60% along with mild LVH/ LAE. - Echo 03/25/22: EF 60-65% - Echo 12/24/22: EF 60-65% along with mild LVH, mild LAE.    - continue furosemide  20mg  daily - continue spironolactone  25mg  daily - had telemedicine visit with cardiology Florestine) 02/22 - BNP 12/30/23 reviewed and was 381.2  2: HTN- - BP 111/72 - sees PCP at [facility])  - BMP 03/05/24 reviewed: sodium 139, potassium 3.8, creatinine 1.18 and GFR >60 - BMP today  3: DM- - A1c 09/30/23 reviewed and was 4.9%  4: Liver cirrhosis- - likely secondary hepatitis C w/ positive viral load in 2019 - acute viral panel 02/24 demonstrated positive for hepatitis B antigen.  - continue lactulose  TID - saw GI Emil) 07/25 - colonoscopy done 04/01/24  5: Urinary retention- - has foley catheter in place but appears to be leaking - caregiver that is present will let someone know at the group home upon their return.    Return in 3 months, sooner if needed.    Ellouise DELENA Class, FNP 04/08/24

## 2024-04-09 ENCOUNTER — Encounter: Payer: Self-pay | Admitting: Family

## 2024-04-09 ENCOUNTER — Other Ambulatory Visit
Admission: RE | Admit: 2024-04-09 | Discharge: 2024-04-09 | Disposition: A | Discharge status: Home / Self Care | Source: Ambulatory Visit | Attending: Family | Admitting: Family

## 2024-04-09 ENCOUNTER — Ambulatory Visit: Payer: Self-pay | Admitting: Family

## 2024-04-09 ENCOUNTER — Ambulatory Visit (HOSPITAL_BASED_OUTPATIENT_CLINIC_OR_DEPARTMENT_OTHER): Admitting: Family

## 2024-04-09 VITALS — BP 111/72 | HR 60 | Wt 200.0 lb

## 2024-04-09 DIAGNOSIS — R188 Other ascites: Secondary | ICD-10-CM

## 2024-04-09 DIAGNOSIS — K7460 Unspecified cirrhosis of liver: Secondary | ICD-10-CM | POA: Diagnosis not present

## 2024-04-09 DIAGNOSIS — E119 Type 2 diabetes mellitus without complications: Secondary | ICD-10-CM | POA: Diagnosis not present

## 2024-04-09 DIAGNOSIS — I10 Essential (primary) hypertension: Secondary | ICD-10-CM | POA: Diagnosis not present

## 2024-04-09 DIAGNOSIS — I5032 Chronic diastolic (congestive) heart failure: Secondary | ICD-10-CM | POA: Diagnosis present

## 2024-04-09 DIAGNOSIS — R339 Retention of urine, unspecified: Secondary | ICD-10-CM

## 2024-04-09 LAB — BASIC METABOLIC PANEL WITH GFR
Anion gap: 5 (ref 5–15)
BUN: 25 mg/dL — ABNORMAL HIGH (ref 8–23)
CO2: 24 mmol/L (ref 22–32)
Calcium: 10.2 mg/dL (ref 8.9–10.3)
Chloride: 107 mmol/L (ref 98–111)
Creatinine, Ser: 1.43 mg/dL — ABNORMAL HIGH (ref 0.61–1.24)
GFR, Estimated: 51 mL/min — ABNORMAL LOW (ref >60)
Glucose, Bld: 153 mg/dL — ABNORMAL HIGH (ref 70–99)
Potassium: 3.5 mmol/L (ref 3.5–5.1)
Sodium: 136 mmol/L (ref 135–145)

## 2024-04-09 NOTE — Patient Instructions (Signed)
 Medication Changes:  No medication changes.   Lab Work:  Go over to the [facility]. Go pass the gift shop and have your blood work completed.  We will only call you if the results are abnormal or if the provider would like to make medication changes.  No news is good news.   Follow-Up in: 3 month with Ellouise Class, FNP.   Thank you for choosing [facility].    At the Advanced [facility] Failure Clinic, you and your health needs are our priority. We have a designated team specialized in the treatment of Heart Failure. This Care Team includes your primary Heart Failure Specialized Cardiologist (physician), Advanced Practice Providers (APPs- Physician Assistants and Nurse Practitioners), and Pharmacist who all work together to provide you with the care you need, when you need it.   You may see any of the following providers on your designated Care Team at your next follow up:  Dr. Toribio Fuel Dr. Ezra Shuck Dr. Ria Commander Dr. Morene Brownie Ellouise Class, FNP Jaun Bash, RPH-CPP  Please be sure to bring in all your medications bottles to every appointment.   Need to Contact Us :  If you have any questions or concerns before your next appointment please send us  a message through [facility] or call our office at [phone].    TO LEAVE A MESSAGE FOR THE NURSE SELECT OPTION 2, PLEASE LEAVE A MESSAGE INCLUDING: YOUR NAME DATE OF BIRTH CALL BACK NUMBER REASON FOR CALL**this is important as we prioritize the call backs  YOU WILL RECEIVE A CALL BACK THE SAME DAY AS LONG AS YOU CALL BEFORE 4:00 PM

## 2024-04-10 ENCOUNTER — Other Ambulatory Visit: Payer: Self-pay | Admitting: Interventional Radiology

## 2024-04-10 DIAGNOSIS — Z01818 Encounter for other preprocedural examination: Secondary | ICD-10-CM

## 2024-04-10 NOTE — H&P (Shared)
 Chief Complaint: Patient was seen in consultation today for urinary retention, with consideration for suprapubic catheter placement.  Referring Provider(s): Dr. Glendia Barba, MD   Supervising Physician: Karalee Beat  Patient Status: [facility] - Out-pt  Code Status History  Date Active Date Inactive Code Status Order ID Comments User Context  02/17/2024 2159 02/22/2024 2150 Limited: Do not attempt resuscitation (DNR) -DNR-LIMITED -Do Not Intubate/DNI [id number]  CoxGreig SAILOR, DO ED  Questions for Most Recent Historical Code Status (Order [id number])  Question Answer  If pulseless and not breathing No CPR or chest compressions.  In Pre-Arrest Conditions (Patient Is Breathing and Has A Pulse) Do not intubate. Provide all appropriate non-invasive medical interventions. Avoid ICU transfer unless indicated or required.  Consent: Discussion documented in EHR or advanced directives reviewed   Patient currently has DNR order in place. Discussion with the patient and family regarding wishes.  {Blank single:[id number]::The original DNR order is maintained during the procedure and prior treatment limitations are upheld during the procedure.,The DNR order is rescinded during the procedure and the patient consents to the use of any resuscitation procedure needed to treat the clinical events that occur.,The DNR order is modified during the procedure such that limited attempts at resuscitation are clearly defined with regards to specific procedures. ,The patient and surrogate allow the IR team to use clinical judgment during the procedure in determining which resuscitation procedures are appropriate in the context of the situation and the patient's stated goals of care.}   History of Present Illness: [name] is a 73 y.o. male  with PMHx notable for urinary retention , HTN, DM type II, COPD, obesity, CHF, asthma, and others as delineated below.  Patient is followed by Dr. Barba at  [facility]. Per dr. Neale progress note on 8/1: [name] is a 73 y.o. male referred for evaluation of urinary retention.    Past medical history significant for advanced hepatic cirrhosis and recurrent hepatic encephalopathy with several admissions.  Chronic indwelling Foley catheter at least since the Spring of 2025. Does not appear he has had a voiding trial.  On Aldactone  and states he produces a significant amount of urine in a 24-hour period; does not desire a voiding trial. Currently resides at a group home.  Patient was recommended for suprapubic catheter placement. Patient is in agreement with plan. Interventional Radiology was requested for suprapubic catheter placement. Patient is scheduled for same in IR today.   ***Patient is alert and laying in bed, calm.  Patient is currently without any significant complaints.  Patient denies any fevers, headache, chest pain, SOB, cough, abdominal pain, nausea, vomiting or bleeding.     Past Medical History:  Diagnosis Date   (HFpEF) heart failure with preserved ejection fraction (HCC)    a.) TTE 03/20/2018: EF 60-65%, no RWMAs, mild LAE, mild-mod MR, PASP 42; b.) TTE 03/31/2021: EF 55-60%, no RWMAs, mild LVH, mild LAE, AoV sclerosis without stenosis; c.) TTE 03/25/2022: EF 60-65%, no RWMAs, AoV sclerosis without stenosis; d.) TTE 12/24/2022: EF 60-65%, no RWMAs, mild LVH, mild LAE.   Anasarca    Anemia    Anxiety    Aortic atherosclerosis (HCC)    Arthritis    Asthma    Bacteremia due to Escherichia coli 05/2023   Bacteremia due to Klebsiella pneumoniae 2023   Bilateral carotid artery disease (HCC)    Bradycardia    CAD (coronary artery disease) 10/21/2006   a.) MV 10/21/2006: small reversible defect involving the apical  lateral wall c/w possible ischemia; b.) MV: 03/24/2018: no ischemia; c.) MV 10/04/2021: LAD calcifications   CAD (coronary artery disease)    Cervical radiculopathy    Chest pain    a.) MV  03/21/2018: no ischemia; b.) MV 10/04/2021: no ischemia   Chronic back pain    Chronic common bile duct dilatation    Cirrhosis (HCC)    a.) (+) hepatic lesion on CT; likely hepatocellular carcinoma; b.) CT evidence of associated portal hypertension; c.) (+) abdominopelvic anasarca   COPD (chronic obstructive pulmonary disease) (HCC)    DDD (degenerative disc disease), lumbar    Depression    Frequent falls    GERD (gastroesophageal reflux disease)    HBV (hepatitis B virus) infection    HCV (hepatitis C virus)    Hernia of anterior abdominal wall    History of 2019 novel coronavirus disease (COVID-19) 04/16/2020   Homelessness    a.) limited on placement as patient is listed on [place] sex offender listing for exploitation of a minor (possession of child pornography); incarcerated 12 years (released 11/2017)   Hyperlipidemia    Hypertension    Infestation by bed bug 02/2022   Malingering    Migraine    Nephrolithiasis    Obesity    Portal hypertension (HCC)    PTSD (post-traumatic stress disorder)    a.) brief training in the [employer] at age 36; someone was killed in front of him   Pulmonary HTN (HCC)    a.) multiple CT scans desmontrating dilated PA c/w pHTN; b.) TTE 03/20/2018: PASP 42 mmHg   Sleep apnea    a.) no nocturnal PAP therapy   Sleep apnea    Stenosis of cervical spine with myelopathy (HCC)    Suicidal ideations    T2DM (type 2 diabetes mellitus) (HCC)    Thrombocytopenia (HCC)    a.) related to hepatic cirrhosis; spleen normal in size   TIA (transient ischemic attack)    TIA (transient ischemic attack) 09/30/2023    Past Surgical History:  Procedure Laterality Date   ANTERIOR CERVICAL DECOMP/DISCECTOMY FUSION N/A 07/01/2023   Procedure: ANTERIOR CERVICAL DISCECTOMY AND FUSION (FORGE);  Surgeon: Clois Fret, MD;  Location: [facility] ORS;  Service: Neurosurgery;  Laterality: N/A;  Keep as last case of the day per Dr Clois   ANTERIOR CERVICAL DISCECTOMY      APPENDECTOMY     CARDIAC CATHETERIZATION     CHOLECYSTECTOMY     COLONOSCOPY N/A 04/01/2024   Procedure: COLONOSCOPY;  Surgeon: Toledo, Ladell POUR, MD;  Location: [facility];  Service: Gastroenterology;  Laterality: N/A;   ESOPHAGOGASTRODUODENOSCOPY N/A 04/01/2024   Procedure: EGD (ESOPHAGOGASTRODUODENOSCOPY);  Surgeon: Toledo, Ladell POUR, MD;  Location: [facility];  Service: Gastroenterology;  Laterality: N/A;   EYE SURGERY     INNER EAR SURGERY     NOSE SURGERY     POLYPECTOMY  04/01/2024   Procedure: POLYPECTOMY, INTESTINE;  Surgeon: Toledo, Ladell POUR, MD;  Location: [facility] END[facility];  Service: Gastroenterology;;   XI ROBOTIC LAPAROSCOPIC ASSISTED APPENDECTOMY N/A 11/05/2022   Procedure: XI ROBOTIC LAPAROSCOPIC ASSISTED APPENDECTOMY;  Surgeon: Rodolph Romano, MD;  Location: [facility] ORS;  Service: General;  Laterality: N/A;    Allergies: Bee venom, Codeine , Rsv mrna pre-f virus vaccine, Meloxicam, Codeine , Novocain [procaine], Sulfa antibiotics, and Sulfa antibiotics  Medications: Prior to Admission medications   Medication Sig Start Date End Date Taking? Authorizing Provider  acetaminophen  (TYLENOL ) 650 MG CR tablet Take 650 mg by mouth every 8 (eight) hours as needed for  pain.    [provider]  albuterol  (PROVENTIL ) (2.5 MG/3ML) 0.083% nebulizer solution Take 2.5 mg by nebulization every 4 (four) hours as needed for wheezing or shortness of breath. 07/16/23   [provider]  aspirin  81 MG chewable tablet Chew 81 mg by mouth daily. Patient not taking: Reported on 04/09/2024    [provider]  atorvastatin  (LIPITOR) 40 MG tablet Take 40 mg by mouth daily.    [provider]  citalopram  (CELEXA ) 20 MG tablet Take 20 mg by mouth daily.    [provider]  feeding supplement (ENSURE ENLIVE / ENSURE PLUS) LIQD Take 237 mLs by mouth 3 (three) times daily between meals. 10/25/23   Barbarann Nest, MD  furosemide  (LASIX ) 20 MG tablet Take 2  tablets (40 mg total) by mouth daily. 02/03/24 04/09/24  Donette Ellouise LABOR, FNP  gabapentin  (NEURONTIN ) 300 MG capsule Take 300 mg by mouth 2 (two) times daily. 06/07/23   [provider]  lactulose  (CHRONULAC ) 10 GM/15ML solution Take 67.5 mLs (45 g total) by mouth 3 (three) times daily. Please titrate to have 2-3 soft bowel movements daily 01/12/24   Caleen Qualia, MD  methocarbamol  (ROBAXIN ) 500 MG tablet Take 500 mg by mouth 4 (four) times daily.    [provider]  Multiple Vitamin (MULTIVITAMIN WITH MINERALS) TABS tablet Take 1 tablet by mouth daily.    [provider]  Na Sulfate-K Sulfate-Mg Sulfate concentrate (SUPREP) 17.5-3.13-1.6 GM/177ML SOLN Take 1 Bottle by mouth. 03/19/24   [provider]  nitroGLYCERIN  (NITROLINGUAL ) 0.4 MG/SPRAY spray Place 1 spray under the tongue every 5 (five) minutes x 3 doses as needed for chest pain. 10/30/23   Donette Ellouise LABOR, FNP  nystatin cream (MYCOSTATIN) Apply 1 Application topically 3 (three) times daily.    [provider]  ondansetron  (ZOFRAN ) 4 MG tablet Take 1 tablet (4 mg total) by mouth every 6 (six) hours as needed for nausea. 06/21/23   Josette Ade, MD  ondansetron  (ZOFRAN -ODT) 8 MG disintegrating tablet Take 8 mg by mouth. 01/24/23   [provider]  pantoprazole  (PROTONIX ) 40 MG tablet Take 40 mg by mouth daily. 09/24/23   [provider]  phenol (CHLORASEPTIC) 1.4 % LIQD Use as directed 1 spray in the mouth or throat as needed for throat irritation / pain.    [provider]  QUEtiapine  (SEROQUEL ) 50 MG tablet Take 50 mg by mouth at bedtime. 11/19/23   [provider]  senna (SENOKOT) 8.6 MG TABS tablet Take 1 tablet (8.6 mg total) by mouth 2 (two) times daily as needed for mild constipation. 07/02/23   Gregory Edsel Ruth, PA  spironolactone  (ALDACTONE ) 25 MG tablet Take 1 tablet (25 mg total) by mouth daily. Patient taking differently: Take 100 mg by mouth daily.  01/12/24 04/09/24  Caleen Qualia, MD  VENTOLIN  HFA 108 (90 Base) MCG/ACT inhaler Inhale 2 puffs into the lungs every 4 (four) hours as needed for wheezing. 07/16/23   [provider]  XIFAXAN  550 MG TABS tablet Take 550 mg by mouth 2 (two) times daily. 09/24/23   [provider]     Family History  Problem Relation Age of Onset   Hypertension Mother    Heart disease Mother    Heart failure Maternal Grandmother     Social History   Socioeconomic History   Marital status: Single    Spouse name: Not on file   Number of children: 2   Years of education: 30  Highest education level: 12th grade  Occupational History   Occupation: retired  Tobacco Use   Smoking status: Every Day    Types: Cigarettes   Smokeless tobacco: Never  Vaping Use   Vaping status: Not on file  Substance and Sexual Activity   Alcohol use: Not Currently    Comment: unknown   Drug use: Not Currently   Sexual activity: Not Currently    Birth control/protection: None  Other Topics Concern   Not on file  Social History Narrative   ** Merged History Encounter **       Lives in [facility] care home   Social Drivers of Health   Financial Resource Strain: High Risk (04/27/2022)   Overall Financial Resource Strain (CARDIA)    Difficulty of Paying Living Expenses: Very hard  Food Insecurity: No Food Insecurity (02/17/2024)   Hunger Vital Sign    Worried About Running Out of Food in the Last Year: Never true    Ran Out of Food in the Last Year: Never true  Transportation Needs: No Transportation Needs (02/17/2024)   PRAPARE - Administrator, Civil Service (Medical): No    Lack of Transportation (Non-Medical): No  Physical Activity: Sufficiently Active (04/16/2018)   Exercise Vital Sign    Days of Exercise per Week: 7 days    Minutes of Exercise per Session: 30 min  Stress: Stress Concern Present (04/16/2018)   [employer] of Occupational Health - Occupational Stress  Questionnaire    Feeling of Stress : Very much  Social Connections: Socially Isolated (02/17/2024)   Social Connection and Isolation Panel    Frequency of Communication with Friends and Family: Never    Frequency of Social Gatherings with Friends and Family: Never    Attends Religious Services: 1 to 4 times per year    Active Member of [employer] or Organizations: No    Attends Banker Meetings: Never    Marital Status: Widowed     Review of Systems: A 12 point ROS discussed and pertinent positives are indicated in the HPI above.  All other systems are negative.  Vital Signs: There were no vitals taken for this visit.  Advance Care Plan: The advanced care place/surrogate decision maker was discussed at the time of visit and the patient did not wish to discuss or was not able to name a surrogate decision maker or provide an advance care plan.  Physical Exam  Imaging: No results found.  Labs:  CBC: Recent Labs    02/19/24 0936 02/20/24 0812 02/22/24 0539 03/05/24 2155  WBC 3.0* 2.5* 3.1* 1.9*  HGB 8.5* 8.0* 8.2* 8.1*  HCT 25.7* 24.1* 24.3* 25.2*  PLT 48* 50* 61* 84*    COAGS: Recent Labs    10/21/23 1842 12/01/23 0335 12/02/23 0822 12/30/23 1631 02/17/24 1851  INR 1.3* 1.4* 1.3* 1.4* 2.0*  APTT 36  --   --  35  --     BMP: Recent Labs    02/20/24 0812 02/22/24 0539 03/05/24 2155 04/09/24 1154  NA 136 137 139 136  K 4.0 4.4 3.8 3.5  CL 107 109 109 107  CO2 24 23 23 24   GLUCOSE 102* 88 140* 153*  BUN 29* 26* 14 25*  CALCIUM  8.7* 9.3 8.8* 10.2  CREATININE 1.45* 1.13 1.18 1.43*  GFRNONAA 51* >60 >60 51*    LIVER FUNCTION TESTS: Recent Labs    02/18/24 0323 02/19/24 0936 02/20/24 0812 02/22/24 0539  BILITOT 2.5* 1.2 1.3* 1.0  AST 43* 53* 54* 92*  ALT 21 23 26  46*  ALKPHOS 48 41 45 60  PROT 5.8* 5.2* 4.8* 4.8*  ALBUMIN  2.5* 2.1* 1.8* 1.8*    TUMOR MARKERS: No results for input(s): AFPTM, CEA, CA199, CHROMGRNA in the last  8760 hours.  Assessment and Plan: Per Dr. Neale progress note on 8/1: . Urinary retention Prostate volume calculated on CT 06/19/23 approximately 60 grams and retention most likely secondary to BPH.  We discussed a voiding trial, however based on his daily urine volume has declined Foley catheter removal.  We did discuss the potential complications of chronic indwelling Foley catheter including recurrent UTIs and erythro catheter erosion.  Suprapubic tube placement by IR was discussed, and he is interested in pursuing.  Will schedule SPT placement by IR.  Patient presents for scheduled suprapubic catheter placement in IR today.  ***Patient has been NPO since midnight.  All labs and medications are within acceptable parameters.  No pertinent allergies.   Risks and benefits of suprapubic catheter placement were discussed with the patient including bleeding, infection, damage to adjacent structures, bladder perforation/fistula connection, and sepsis.  All of the patient's questions were answered, patient is agreeable to proceed. Consent signed and in chart.     Thank you for allowing our service to participate in GERLAD PELZEL 's care.  Electronically Signed: Carlin DELENA Griffon, PA-C   04/10/2024, 9:23 AM      I spent a total of 30 Minutes in face to face in clinical consultation, greater than 50% of which was counseling/coordinating care for urinary retention, with consideration for suprapubic catheter placement.

## 2024-04-10 NOTE — Progress Notes (Signed)
 Patient for IR SPT Placement on Mon 04/13/24, I called and spoke with the patient's caregiver, [name] on the phone and gave pre-procedure instructions. [name] was made aware to have the patient here at 8:30a, last dose of ASA 81mg  was Tues 04/07/24, NPO after MN prior to procedure as well as driver post procedure/recovery/discharge. [name] stated understanding.  Called 04/03/24  LVM again on 04/10/24

## 2024-04-13 ENCOUNTER — Ambulatory Visit
Admission: RE | Admit: 2024-04-13 | Discharge: 2024-04-13 | Disposition: A | Discharge status: Home / Self Care | Source: Ambulatory Visit | Attending: Urology | Admitting: Urology

## 2024-04-13 ENCOUNTER — Other Ambulatory Visit: Payer: Self-pay

## 2024-04-13 ENCOUNTER — Encounter: Payer: Self-pay | Admitting: Radiology

## 2024-04-13 ENCOUNTER — Other Ambulatory Visit: Payer: Self-pay | Admitting: Interventional Radiology

## 2024-04-13 DIAGNOSIS — Z66 Do not resuscitate: Secondary | ICD-10-CM | POA: Insufficient documentation

## 2024-04-13 DIAGNOSIS — M549 Dorsalgia, unspecified: Secondary | ICD-10-CM | POA: Insufficient documentation

## 2024-04-13 DIAGNOSIS — E669 Obesity, unspecified: Secondary | ICD-10-CM | POA: Insufficient documentation

## 2024-04-13 DIAGNOSIS — G9341 Metabolic encephalopathy: Secondary | ICD-10-CM | POA: Diagnosis not present

## 2024-04-13 DIAGNOSIS — G8929 Other chronic pain: Secondary | ICD-10-CM | POA: Insufficient documentation

## 2024-04-13 DIAGNOSIS — R339 Retention of urine, unspecified: Secondary | ICD-10-CM

## 2024-04-13 DIAGNOSIS — Z01818 Encounter for other preprocedural examination: Secondary | ICD-10-CM

## 2024-04-13 DIAGNOSIS — I110 Hypertensive heart disease with heart failure: Secondary | ICD-10-CM | POA: Insufficient documentation

## 2024-04-13 DIAGNOSIS — Z6832 Body mass index (BMI) 32.0-32.9, adult: Secondary | ICD-10-CM | POA: Insufficient documentation

## 2024-04-13 DIAGNOSIS — F17210 Nicotine dependence, cigarettes, uncomplicated: Secondary | ICD-10-CM | POA: Insufficient documentation

## 2024-04-13 DIAGNOSIS — K7460 Unspecified cirrhosis of liver: Secondary | ICD-10-CM | POA: Insufficient documentation

## 2024-04-13 DIAGNOSIS — E119 Type 2 diabetes mellitus without complications: Secondary | ICD-10-CM | POA: Insufficient documentation

## 2024-04-13 DIAGNOSIS — J4489 Other specified chronic obstructive pulmonary disease: Secondary | ICD-10-CM | POA: Insufficient documentation

## 2024-04-13 DIAGNOSIS — I5032 Chronic diastolic (congestive) heart failure: Secondary | ICD-10-CM | POA: Insufficient documentation

## 2024-04-13 DIAGNOSIS — Z79899 Other long term (current) drug therapy: Secondary | ICD-10-CM | POA: Insufficient documentation

## 2024-04-13 DIAGNOSIS — T83510A Infection and inflammatory reaction due to cystostomy catheter, initial encounter: Secondary | ICD-10-CM | POA: Diagnosis not present

## 2024-04-13 HISTORY — PX: IR CYSTOSTOMY TUBE PLACEMENT/BLADDER ASPIRATION: IMG1097

## 2024-04-13 LAB — CBC
HCT: 36.4 % — ABNORMAL LOW (ref 39.0–52.0)
Hemoglobin: 12 g/dL — ABNORMAL LOW (ref 13.0–17.0)
MCH: 29.6 pg (ref 26.0–34.0)
MCHC: 33 g/dL (ref 30.0–36.0)
MCV: 89.9 fL (ref 80.0–100.0)
Platelets: 71 K/uL — ABNORMAL LOW (ref 150–400)
RBC: 4.05 MIL/uL — ABNORMAL LOW (ref 4.22–5.81)
RDW: 15.7 % — ABNORMAL HIGH (ref 11.5–15.5)
WBC: 3.8 K/uL — ABNORMAL LOW (ref 4.0–10.5)
nRBC: 0 % (ref 0.0–0.2)

## 2024-04-13 LAB — GLUCOSE, CAPILLARY: Glucose-Capillary: 73 mg/dL (ref 70–99)

## 2024-04-13 MED ORDER — FENTANYL CITRATE (PF) 100 MCG/2ML IJ SOLN
INTRAMUSCULAR | Status: AC
  Filled 2024-04-13: qty 2

## 2024-04-13 MED ORDER — MIDAZOLAM HCL 2 MG/2ML IJ SOLN
INTRAMUSCULAR | Status: AC
  Filled 2024-04-13: qty 2

## 2024-04-13 MED ORDER — LIDOCAINE HCL 1 % IJ SOLN
INTRAMUSCULAR | Status: AC
  Filled 2024-04-13: qty 20

## 2024-04-13 MED ORDER — IOHEXOL 300 MG/ML  SOLN: 10.0000 mL | Freq: Once | INTRAMUSCULAR | Status: DC | PRN

## 2024-04-13 MED ORDER — MIDAZOLAM HCL 2 MG/2ML IJ SOLN
INTRAMUSCULAR | Status: AC | PRN
  Administered 2024-04-13: 1 mg via INTRAVENOUS

## 2024-04-13 MED ORDER — FENTANYL CITRATE (PF) 100 MCG/2ML IJ SOLN
INTRAMUSCULAR | Status: AC | PRN
  Administered 2024-04-13: 50 ug via INTRAVENOUS

## 2024-04-13 MED ORDER — MIDAZOLAM HCL 5 MG/5ML IJ SOLN
INTRAMUSCULAR | Status: AC | PRN
  Administered 2024-04-13: 1 mg via INTRAVENOUS

## 2024-04-13 MED ORDER — SODIUM CHLORIDE 0.9 % IV SOLN: INTRAVENOUS | Status: DC

## 2024-04-13 NOTE — Procedures (Signed)
 Interventional Radiology Procedure Note  Procedure: Placement of a 92F Council-tip suprapubic catheter.   Complications: None  Estimated Blood Loss: None  Recommendations: - DC home after 1 hr - Return to IR in 6-8 weeks for tube exchange    Signed,  Wilkie LOIS Lent, MD

## 2024-04-13 NOTE — Progress Notes (Signed)
 Patient clinically stable post IR SPT 16 FR placement per Dr Karalee, tolerated well. Denies complaints immediately post procedure. Received Versed  2 mg along with Fentanyl  50 mcg IV for procedure.report given to Joni Boyd RN specials/16.

## 2024-04-14 ENCOUNTER — Other Ambulatory Visit: Payer: Self-pay

## 2024-04-14 ENCOUNTER — Inpatient Hospital Stay
Admission: EM | Admit: 2024-04-14 | Discharge: 2024-04-16 | DRG: 698 | Disposition: A | Discharge status: Home Health | Attending: Family Medicine | Admitting: Family Medicine

## 2024-04-14 ENCOUNTER — Emergency Department

## 2024-04-14 DIAGNOSIS — B964 Proteus (mirabilis) (morganii) as the cause of diseases classified elsewhere: Secondary | ICD-10-CM | POA: Diagnosis present

## 2024-04-14 DIAGNOSIS — R4182 Altered mental status, unspecified: Secondary | ICD-10-CM | POA: Diagnosis present

## 2024-04-14 DIAGNOSIS — J449 Chronic obstructive pulmonary disease, unspecified: Secondary | ICD-10-CM | POA: Diagnosis present

## 2024-04-14 DIAGNOSIS — D631 Anemia in chronic kidney disease: Secondary | ICD-10-CM | POA: Diagnosis present

## 2024-04-14 DIAGNOSIS — E785 Hyperlipidemia, unspecified: Secondary | ICD-10-CM | POA: Diagnosis present

## 2024-04-14 DIAGNOSIS — Z604 Social exclusion and rejection: Secondary | ICD-10-CM | POA: Diagnosis present

## 2024-04-14 DIAGNOSIS — G9341 Metabolic encephalopathy: Secondary | ICD-10-CM | POA: Diagnosis present

## 2024-04-14 DIAGNOSIS — K766 Portal hypertension: Secondary | ICD-10-CM | POA: Diagnosis present

## 2024-04-14 DIAGNOSIS — I5032 Chronic diastolic (congestive) heart failure: Secondary | ICD-10-CM | POA: Diagnosis present

## 2024-04-14 DIAGNOSIS — R339 Retention of urine, unspecified: Secondary | ICD-10-CM | POA: Diagnosis present

## 2024-04-14 DIAGNOSIS — J4489 Other specified chronic obstructive pulmonary disease: Secondary | ICD-10-CM | POA: Diagnosis present

## 2024-04-14 DIAGNOSIS — G4730 Sleep apnea, unspecified: Secondary | ICD-10-CM | POA: Diagnosis present

## 2024-04-14 DIAGNOSIS — Z91030 Bee allergy status: Secondary | ICD-10-CM

## 2024-04-14 DIAGNOSIS — R7881 Bacteremia: Secondary | ICD-10-CM | POA: Diagnosis present

## 2024-04-14 DIAGNOSIS — I272 Pulmonary hypertension, unspecified: Secondary | ICD-10-CM | POA: Diagnosis present

## 2024-04-14 DIAGNOSIS — Z7982 Long term (current) use of aspirin: Secondary | ICD-10-CM

## 2024-04-14 DIAGNOSIS — G43909 Migraine, unspecified, not intractable, without status migrainosus: Secondary | ICD-10-CM | POA: Diagnosis present

## 2024-04-14 DIAGNOSIS — D696 Thrombocytopenia, unspecified: Secondary | ICD-10-CM | POA: Diagnosis present

## 2024-04-14 DIAGNOSIS — N3001 Acute cystitis with hematuria: Principal | ICD-10-CM | POA: Diagnosis present

## 2024-04-14 DIAGNOSIS — Z888 Allergy status to other drugs, medicaments and biological substances status: Secondary | ICD-10-CM

## 2024-04-14 DIAGNOSIS — I2510 Atherosclerotic heart disease of native coronary artery without angina pectoris: Secondary | ICD-10-CM | POA: Diagnosis present

## 2024-04-14 DIAGNOSIS — K7460 Unspecified cirrhosis of liver: Secondary | ICD-10-CM | POA: Diagnosis present

## 2024-04-14 DIAGNOSIS — N390 Urinary tract infection, site not specified: Secondary | ICD-10-CM | POA: Diagnosis present

## 2024-04-14 DIAGNOSIS — Z885 Allergy status to narcotic agent status: Secondary | ICD-10-CM

## 2024-04-14 DIAGNOSIS — Z8249 Family history of ischemic heart disease and other diseases of the circulatory system: Secondary | ICD-10-CM

## 2024-04-14 DIAGNOSIS — T83510A Infection and inflammatory reaction due to cystostomy catheter, initial encounter: Principal | ICD-10-CM | POA: Diagnosis present

## 2024-04-14 DIAGNOSIS — N1831 Chronic kidney disease, stage 3a: Secondary | ICD-10-CM | POA: Diagnosis present

## 2024-04-14 DIAGNOSIS — Y846 Urinary catheterization as the cause of abnormal reaction of the patient, or of later complication, without mention of misadventure at the time of the procedure: Secondary | ICD-10-CM | POA: Diagnosis present

## 2024-04-14 DIAGNOSIS — K7682 Hepatic encephalopathy: Secondary | ICD-10-CM | POA: Diagnosis present

## 2024-04-14 DIAGNOSIS — N401 Enlarged prostate with lower urinary tract symptoms: Secondary | ICD-10-CM | POA: Diagnosis present

## 2024-04-14 DIAGNOSIS — G8929 Other chronic pain: Secondary | ICD-10-CM | POA: Diagnosis present

## 2024-04-14 DIAGNOSIS — K219 Gastro-esophageal reflux disease without esophagitis: Secondary | ICD-10-CM | POA: Diagnosis present

## 2024-04-14 DIAGNOSIS — Z5900 Homelessness unspecified: Secondary | ICD-10-CM

## 2024-04-14 DIAGNOSIS — I130 Hypertensive heart and chronic kidney disease with heart failure and stage 1 through stage 4 chronic kidney disease, or unspecified chronic kidney disease: Secondary | ICD-10-CM | POA: Diagnosis present

## 2024-04-14 DIAGNOSIS — Z87442 Personal history of urinary calculi: Secondary | ICD-10-CM

## 2024-04-14 DIAGNOSIS — F419 Anxiety disorder, unspecified: Secondary | ICD-10-CM | POA: Diagnosis present

## 2024-04-14 DIAGNOSIS — F4310 Post-traumatic stress disorder, unspecified: Secondary | ICD-10-CM | POA: Diagnosis present

## 2024-04-14 DIAGNOSIS — G252 Other specified forms of tremor: Secondary | ICD-10-CM | POA: Diagnosis present

## 2024-04-14 DIAGNOSIS — Z66 Do not resuscitate: Secondary | ICD-10-CM | POA: Diagnosis present

## 2024-04-14 DIAGNOSIS — W19XXXA Unspecified fall, initial encounter: Secondary | ICD-10-CM | POA: Diagnosis present

## 2024-04-14 DIAGNOSIS — E1122 Type 2 diabetes mellitus with diabetic chronic kidney disease: Secondary | ICD-10-CM | POA: Diagnosis present

## 2024-04-14 DIAGNOSIS — Z79899 Other long term (current) drug therapy: Secondary | ICD-10-CM

## 2024-04-14 DIAGNOSIS — Z5986 Financial insecurity: Secondary | ICD-10-CM

## 2024-04-14 DIAGNOSIS — Z8616 Personal history of COVID-19: Secondary | ICD-10-CM

## 2024-04-14 DIAGNOSIS — R531 Weakness: Secondary | ICD-10-CM | POA: Diagnosis present

## 2024-04-14 DIAGNOSIS — Z8673 Personal history of transient ischemic attack (TIA), and cerebral infarction without residual deficits: Secondary | ICD-10-CM

## 2024-04-14 DIAGNOSIS — B961 Klebsiella pneumoniae [K. pneumoniae] as the cause of diseases classified elsewhere: Secondary | ICD-10-CM | POA: Diagnosis present

## 2024-04-14 DIAGNOSIS — N179 Acute kidney failure, unspecified: Secondary | ICD-10-CM | POA: Diagnosis present

## 2024-04-14 DIAGNOSIS — Z882 Allergy status to sulfonamides status: Secondary | ICD-10-CM

## 2024-04-14 LAB — URINALYSIS, W/ REFLEX TO CULTURE (INFECTION SUSPECTED)
Bilirubin Urine: NEGATIVE
Glucose, UA: NEGATIVE mg/dL
Ketones, ur: NEGATIVE mg/dL
Nitrite: NEGATIVE
Protein, ur: NEGATIVE mg/dL
Specific Gravity, Urine: 1.018 (ref 1.005–1.030)
Squamous Epithelial / HPF: 0 /HPF (ref 0–5)
pH: 6 (ref 5.0–8.0)

## 2024-04-14 LAB — COMPREHENSIVE METABOLIC PANEL WITH GFR
ALT: 20 U/L (ref 0–44)
AST: 40 U/L (ref 15–41)
Albumin: 2.9 g/dL — ABNORMAL LOW (ref 3.5–5.0)
Alkaline Phosphatase: 68 U/L (ref 38–126)
Anion gap: 10 (ref 5–15)
BUN: 24 mg/dL — ABNORMAL HIGH (ref 8–23)
CO2: 23 mmol/L (ref 22–32)
Calcium: 10.4 mg/dL — ABNORMAL HIGH (ref 8.9–10.3)
Chloride: 104 mmol/L (ref 98–111)
Creatinine, Ser: 1.63 mg/dL — ABNORMAL HIGH (ref 0.61–1.24)
GFR, Estimated: 44 mL/min — ABNORMAL LOW (ref >60)
Glucose, Bld: 177 mg/dL — ABNORMAL HIGH (ref 70–99)
Potassium: 3.7 mmol/L (ref 3.5–5.1)
Sodium: 137 mmol/L (ref 135–145)
Total Bilirubin: 2.1 mg/dL — ABNORMAL HIGH (ref 0.0–1.2)
Total Protein: 7.7 g/dL (ref 6.5–8.1)

## 2024-04-14 LAB — CBC WITH DIFFERENTIAL/PLATELET
Abs Immature Granulocytes: 0.01 K/uL (ref 0.00–0.07)
Basophils Absolute: 0.1 K/uL (ref 0.0–0.1)
Basophils Relative: 1 %
Eosinophils Absolute: 0.1 K/uL (ref 0.0–0.5)
Eosinophils Relative: 2 %
HCT: 36.6 % — ABNORMAL LOW (ref 39.0–52.0)
Hemoglobin: 12.1 g/dL — ABNORMAL LOW (ref 13.0–17.0)
Immature Granulocytes: 0 %
Lymphocytes Relative: 18 %
Lymphs Abs: 0.8 K/uL (ref 0.7–4.0)
MCH: 29.3 pg (ref 26.0–34.0)
MCHC: 33.1 g/dL (ref 30.0–36.0)
MCV: 88.6 fL (ref 80.0–100.0)
Monocytes Absolute: 0.5 K/uL (ref 0.1–1.0)
Monocytes Relative: 10 %
Neutro Abs: 3.2 K/uL (ref 1.7–7.7)
Neutrophils Relative %: 69 %
Platelets: 75 K/uL — ABNORMAL LOW (ref 150–400)
RBC: 4.13 MIL/uL — ABNORMAL LOW (ref 4.22–5.81)
RDW: 15.8 % — ABNORMAL HIGH (ref 11.5–15.5)
WBC: 4.6 K/uL (ref 4.0–10.5)
nRBC: 0 % (ref 0.0–0.2)

## 2024-04-14 LAB — LIPASE, BLOOD: Lipase: 30 U/L (ref 11–51)

## 2024-04-14 LAB — AMMONIA: Ammonia: 69 umol/L — ABNORMAL HIGH (ref 9–35)

## 2024-04-14 MED ORDER — ENSURE ENLIVE PO LIQD
237.0000 mL | Freq: Three times a day (TID) | ORAL | Status: DC
  Administered 2024-04-14 – 2024-04-16 (×6): 237 mL via ORAL
  Filled 2024-04-14: qty 237

## 2024-04-14 MED ORDER — ATORVASTATIN CALCIUM 20 MG PO TABS
40.0000 mg | ORAL_TABLET | Freq: Every day | ORAL | Status: DC
  Administered 2024-04-14 – 2024-04-16 (×3): 40 mg via ORAL
  Filled 2024-04-14 (×3): qty 2

## 2024-04-14 MED ORDER — LACTULOSE 10 GM/15ML PO SOLN
10.0000 g | Freq: Once | ORAL | Status: AC
  Administered 2024-04-14: 10 g via ORAL
  Filled 2024-04-14: qty 30

## 2024-04-14 MED ORDER — OXYCODONE HCL 5 MG PO TABS
5.0000 mg | ORAL_TABLET | Freq: Four times a day (QID) | ORAL | Status: AC | PRN
  Administered 2024-04-14 – 2024-04-16 (×3): 5 mg via ORAL
  Filled 2024-04-14 (×3): qty 1

## 2024-04-14 MED ORDER — GABAPENTIN 300 MG PO CAPS
300.0000 mg | ORAL_CAPSULE | Freq: Two times a day (BID) | ORAL | Status: DC
  Administered 2024-04-14 – 2024-04-16 (×4): 300 mg via ORAL
  Filled 2024-04-14 (×4): qty 1

## 2024-04-14 MED ORDER — ALBUTEROL SULFATE (2.5 MG/3ML) 0.083% IN NEBU: 2.5000 mg | INHALATION_SOLUTION | RESPIRATORY_TRACT | Status: DC | PRN

## 2024-04-14 MED ORDER — NALOXONE HCL 2 MG/2ML IJ SOSY: 0.4000 mg | PREFILLED_SYRINGE | Freq: Once | INTRAMUSCULAR | Status: DC

## 2024-04-14 MED ORDER — ONDANSETRON HCL 4 MG PO TABS
4.0000 mg | ORAL_TABLET | Freq: Four times a day (QID) | ORAL | Status: DC | PRN
Start: 2024-04-14 — End: 2024-04-17

## 2024-04-14 MED ORDER — IOHEXOL 300 MG/ML  SOLN
100.0000 mL | Freq: Once | INTRAMUSCULAR | Status: AC | PRN
  Administered 2024-04-14: 100 mL via INTRAVENOUS

## 2024-04-14 MED ORDER — FENTANYL CITRATE PF 50 MCG/ML IJ SOSY
50.0000 ug | PREFILLED_SYRINGE | Freq: Once | INTRAMUSCULAR | Status: AC
  Administered 2024-04-14: 50 ug via INTRAVENOUS
  Filled 2024-04-14: qty 1

## 2024-04-14 MED ORDER — ASPIRIN 81 MG PO CHEW
81.0000 mg | CHEWABLE_TABLET | Freq: Every day | ORAL | Status: DC
  Administered 2024-04-14 – 2024-04-16 (×3): 81 mg via ORAL
  Filled 2024-04-14 (×3): qty 1

## 2024-04-14 MED ORDER — ACETAMINOPHEN 325 MG PO TABS
650.0000 mg | ORAL_TABLET | Freq: Three times a day (TID) | ORAL | Status: DC | PRN
  Administered 2024-04-16: 650 mg via ORAL
  Filled 2024-04-14: qty 2

## 2024-04-14 MED ORDER — RIFAXIMIN 550 MG PO TABS
550.0000 mg | ORAL_TABLET | Freq: Two times a day (BID) | ORAL | Status: DC
  Administered 2024-04-14 – 2024-04-15 (×3): 550 mg via ORAL
  Filled 2024-04-14 (×5): qty 1

## 2024-04-14 MED ORDER — SENNA 8.6 MG PO TABS: 1.0000 | ORAL_TABLET | Freq: Two times a day (BID) | ORAL | Status: DC | PRN

## 2024-04-14 MED ORDER — HYDROMORPHONE HCL 1 MG/ML IJ SOLN
0.5000 mg | Freq: Once | INTRAMUSCULAR | Status: AC
  Administered 2024-04-14: 0.5 mg via INTRAVENOUS
  Filled 2024-04-14: qty 0.5

## 2024-04-14 MED ORDER — ENOXAPARIN SODIUM 40 MG/0.4ML IJ SOSY
40.0000 mg | PREFILLED_SYRINGE | INTRAMUSCULAR | Status: DC
  Administered 2024-04-15 – 2024-04-16 (×2): 40 mg via SUBCUTANEOUS
  Filled 2024-04-14 (×2): qty 0.4

## 2024-04-14 MED ORDER — ORAL CARE MOUTH RINSE: 15.0000 mL | OROMUCOSAL | Status: DC | PRN

## 2024-04-14 MED ORDER — CITALOPRAM HYDROBROMIDE 20 MG PO TABS
20.0000 mg | ORAL_TABLET | Freq: Every day | ORAL | Status: DC
  Administered 2024-04-14 – 2024-04-16 (×3): 20 mg via ORAL
  Filled 2024-04-14 (×3): qty 1

## 2024-04-14 MED ORDER — LACTATED RINGERS IV SOLN: INTRAVENOUS | Status: AC

## 2024-04-14 MED ORDER — QUETIAPINE FUMARATE 25 MG PO TABS
50.0000 mg | ORAL_TABLET | Freq: Every day | ORAL | Status: DC
  Administered 2024-04-14 – 2024-04-15 (×2): 50 mg via ORAL
  Filled 2024-04-14 (×2): qty 2

## 2024-04-14 MED ORDER — SODIUM CHLORIDE 0.9 % IV SOLN
1.0000 g | INTRAVENOUS | Status: DC
  Administered 2024-04-14 – 2024-04-15 (×2): 1 g via INTRAVENOUS
  Filled 2024-04-14 (×3): qty 10

## 2024-04-14 MED ORDER — ONDANSETRON HCL 4 MG/2ML IJ SOLN: 4.0000 mg | Freq: Four times a day (QID) | INTRAMUSCULAR | Status: DC | PRN

## 2024-04-14 MED ORDER — SODIUM CHLORIDE 0.9 % IV SOLN
2.0000 g | Freq: Once | INTRAVENOUS | Status: AC
  Administered 2024-04-14: 2 g via INTRAVENOUS
  Filled 2024-04-14: qty 12.5

## 2024-04-14 MED ORDER — LACTULOSE 10 GM/15ML PO SOLN
45.0000 g | Freq: Three times a day (TID) | ORAL | Status: DC
  Administered 2024-04-14 – 2024-04-16 (×6): 45 g via ORAL
  Filled 2024-04-14 (×6): qty 90

## 2024-04-14 MED ORDER — PANTOPRAZOLE SODIUM 40 MG PO TBEC
40.0000 mg | DELAYED_RELEASE_TABLET | Freq: Every day | ORAL | Status: DC
  Administered 2024-04-14 – 2024-04-16 (×3): 40 mg via ORAL
  Filled 2024-04-14 (×3): qty 1

## 2024-04-14 NOTE — H&P (Addendum)
 History and Physical:    [name]   FMW:[id number] DOB: 22-Mar-1951 DOA: 04/14/2024  Referring MD/provider: Nilsa Dade, MD PCP: [facility]   Patient coming from: Group home  Chief Complaint: General weakness  History of Present Illness:   [name] is a 73 y.o. male with medical history significant for hepatitis C, liver cirrhosis, portal hypertension, hypertension, COPD, hyperlipidemia, diabetes mellitus, depression, history of TIA, multiple admissions in the past for hepatic encephalopathy, discharged from the hospital in June 2025.  Subsequently to UTI and Klebsiella and Proteus bacteremia.  He had suprapubic catheter placed at [facility] on 04/13/2024.  He presented from the group home to the ED because of abdominal pain and general weakness.  He was too weak to get out of bed on the morning of admission so EMS was called and he was transported to the ED for further evaluation.  ED Course:   vital signs in the ED temperature 99.2 F, heart rate 49, BP 127/61, O2 sat 100% on room air.  Labs showed sodium of 137, potassium 3.7, glucose 177, BUN 4 creatinine 1.63, WBC 4.6, hemoglobin 12.1, platelet count 75.  Urinalysis showed yellow clear urine small leukocytes, rare bacteria, WBCs 21-50 per hpf and RBCs 21-50 per hpf.   Hospitalist was consulted to admit patient for acute UTI and acute metabolic/hepatic encephalopathy.     ROS:   ROS all other systems reviewed were negative  Past Medical History:   Past Medical History:  Diagnosis Date   (HFpEF) heart failure with preserved ejection fraction (HCC)    a.) TTE 03/20/2018: EF 60-65%, no RWMAs, mild LAE, mild-mod MR, PASP 42; b.) TTE 03/31/2021: EF 55-60%, no RWMAs, mild LVH, mild LAE, AoV sclerosis without stenosis; c.) TTE 03/25/2022: EF 60-65%, no RWMAs, AoV sclerosis without stenosis; d.) TTE 12/24/2022: EF 60-65%, no RWMAs, mild LVH, mild LAE.   Anasarca    Anemia    Anxiety    Aortic  atherosclerosis (HCC)    Arthritis    Asthma    Bacteremia due to Escherichia coli 05/2023   Bacteremia due to Klebsiella pneumoniae 2023   Bilateral carotid artery disease (HCC)    Bradycardia    CAD (coronary artery disease) 10/21/2006   a.) MV 10/21/2006: small reversible defect involving the apical  lateral wall c/w possible ischemia; b.) MV: 03/24/2018: no ischemia; c.) MV 10/04/2021: LAD calcifications   CAD (coronary artery disease)    Cervical radiculopathy    Chest pain    a.) MV 03/21/2018: no ischemia; b.) MV 10/04/2021: no ischemia   Chronic back pain    Chronic common bile duct dilatation    Cirrhosis (HCC)    a.) (+) hepatic lesion on CT; likely hepatocellular carcinoma; b.) CT evidence of associated portal hypertension; c.) (+) abdominopelvic anasarca   COPD (chronic obstructive pulmonary disease) (HCC)    DDD (degenerative disc disease), lumbar    Depression    Frequent falls    GERD (gastroesophageal reflux disease)    HBV (hepatitis B virus) infection    HCV (hepatitis C virus)    Hernia of anterior abdominal wall    History of 2019 novel coronavirus disease (COVID-19) 04/16/2020   Homelessness    a.) limited on placement as patient is listed on [place] sex offender listing for exploitation of a minor (possession of child pornography); incarcerated 12 years (released 11/2017)   Hyperlipidemia    Hypertension    Infestation by bed bug 02/2022   Malingering  Migraine    Nephrolithiasis    Obesity    Portal hypertension (HCC)    PTSD (post-traumatic stress disorder)    a.) brief training in the [employer] at age 36; someone was killed in front of him   Pulmonary HTN (HCC)    a.) multiple CT scans desmontrating dilated PA c/w pHTN; b.) TTE 03/20/2018: PASP 42 mmHg   Sleep apnea    a.) no nocturnal PAP therapy   Sleep apnea    Stenosis of cervical spine with myelopathy (HCC)    Suicidal ideations    T2DM (type 2 diabetes mellitus) (HCC)    Thrombocytopenia (HCC)     a.) related to hepatic cirrhosis; spleen normal in size   TIA (transient ischemic attack)    TIA (transient ischemic attack) 09/30/2023    Past Surgical History:   Past Surgical History:  Procedure Laterality Date   ANTERIOR CERVICAL DECOMP/DISCECTOMY FUSION N/A 07/01/2023   Procedure: ANTERIOR CERVICAL DISCECTOMY AND FUSION (FORGE);  Surgeon: Clois Fret, MD;  Location: [facility] ORS;  Service: Neurosurgery;  Laterality: N/A;  Keep as last case of the day per Dr Clois   ANTERIOR CERVICAL DISCECTOMY     APPENDECTOMY     CARDIAC CATHETERIZATION     CHOLECYSTECTOMY     COLONOSCOPY N/A 04/01/2024   Procedure: COLONOSCOPY;  Surgeon: Toledo, Ladell POUR, MD;  Location: [facility] END[facility];  Service: Gastroenterology;  Laterality: N/A;   ESOPHAGOGASTRODUODENOSCOPY N/A 04/01/2024   Procedure: EGD (ESOPHAGOGASTRODUODENOSCOPY);  Surgeon: Toledo, Ladell POUR, MD;  Location: [facility] ENDOS[facility];  Service: Gastroenterology;  Laterality: N/A;   EYE SURGERY     INNER EAR SURGERY     IR CYSTOSTOMY TUBE PLACEMENT/BLADDER ASPIRATION  04/13/2024   NOSE SURGERY     POLYPECTOMY  04/01/2024   Procedure: POLYPECTOMY, INTESTINE;  Surgeon: Toledo, Ladell POUR, MD;  Location: [facility] END[facility];  Service: Gastroenterology;;   XI ROBOTIC LAPAROSCOPIC ASSISTED APPENDECTOMY N/A 11/05/2022   Procedure: XI ROBOTIC LAPAROSCOPIC ASSISTED APPENDECTOMY;  Surgeon: Rodolph Romano, MD;  Location: [facility] ORS;  Service: General;  Laterality: N/A;    Social History:   Social History   Socioeconomic History   Marital status: Single    Spouse name: Not on file   Number of children: 2   Years of education: 12   Highest education level: 12th grade  Occupational History   Occupation: retired  Tobacco Use   Smoking status: Some Days    Types: Cigarettes   Smokeless tobacco: Never   Tobacco comments:    2 times/month per patient   Vaping Use   Vaping status: Not on file  Substance and Sexual Activity   Alcohol use: Not Currently     Comment: unknown   Drug use: Not Currently   Sexual activity: Not Currently    Birth control/protection: None  Other Topics Concern   Not on file  Social History Narrative   ** Merged History Encounter **       Lives in [place] care home   Social Drivers of Health   Financial Resource Strain: High Risk (04/27/2022)   Overall Financial Resource Strain (CARDIA)    Difficulty of Paying Living Expenses: Very hard  Food Insecurity: No Food Insecurity (02/17/2024)   Hunger Vital Sign    Worried About Running Out of Food in the Last Year: Never true    Ran Out of Food in the Last Year: Never true  Transportation Needs: No Transportation Needs (02/17/2024)   PRAPARE - Administrator, Civil Service (Medical):  No    Lack of Transportation (Non-Medical): No  Physical Activity: Sufficiently Active (04/16/2018)   Exercise Vital Sign    Days of Exercise per Week: 7 days    Minutes of Exercise per Session: 30 min  Stress: Stress Concern Present (04/16/2018)   [employer] of Occupational Health - Occupational Stress Questionnaire    Feeling of Stress : Very much  Social Connections: Socially Isolated (02/17/2024)   Social Connection and Isolation Panel    Frequency of Communication with Friends and Family: Never    Frequency of Social Gatherings with Friends and Family: Never    Attends Religious Services: 1 to 4 times per year    Active Member of [employer] or Organizations: No    Attends Banker Meetings: Never    Marital Status: Widowed  Intimate Partner Violence: Unknown (02/17/2024)   [name], Afraid, [name], and Kick questionnaire    Fear of Current or Ex-Partner: Patient unable to answer    Emotionally Abused: Patient unable to answer    Physically Abused: No    Sexually Abused: No    Allergies   Bee venom, Codeine , Rsv mrna pre-f virus vaccine, Meloxicam, Codeine , Novocain [procaine], Sulfa antibiotics, and Sulfa antibiotics  Family history:    Family History  Problem Relation Age of Onset   Hypertension Mother    Heart disease Mother    Heart failure Maternal Grandmother     Current Medications:   Prior to Admission medications   Medication Sig Start Date End Date Taking? Authorizing Provider  acetaminophen  (TYLENOL ) 650 MG CR tablet Take 650 mg by mouth every 8 (eight) hours as needed for pain.    [provider]  albuterol  (PROVENTIL ) (2.5 MG/3ML) 0.083% nebulizer solution Take 2.5 mg by nebulization every 4 (four) hours as needed for wheezing or shortness of breath. 07/16/23   [provider]  aspirin  81 MG chewable tablet Chew 81 mg by mouth daily.    [provider]  atorvastatin  (LIPITOR) 40 MG tablet Take 40 mg by mouth daily.    [provider]  citalopram  (CELEXA ) 20 MG tablet Take 20 mg by mouth daily.    [provider]  feeding supplement (ENSURE ENLIVE / ENSURE PLUS) LIQD Take 237 mLs by mouth 3 (three) times daily between meals. 10/25/23   Barbarann Nest, MD  furosemide  (LASIX ) 20 MG tablet Take 2 tablets (40 mg total) by mouth daily. 02/03/24 04/09/24  Donette Ellouise LABOR, FNP  gabapentin  (NEURONTIN ) 300 MG capsule Take 300 mg by mouth 2 (two) times daily. 06/07/23   [provider]  lactulose  (CHRONULAC ) 10 GM/15ML solution Take 67.5 mLs (45 g total) by mouth 3 (three) times daily. Please titrate to have 2-3 soft bowel movements daily 01/12/24   Caleen Qualia, MD  methocarbamol  (ROBAXIN ) 500 MG tablet Take 500 mg by mouth 4 (four) times daily.    [provider]  Multiple Vitamin (MULTIVITAMIN WITH MINERALS) TABS tablet Take 1 tablet by mouth daily.    [provider]  Na Sulfate-K Sulfate-Mg Sulfate concentrate (SUPREP) 17.5-3.13-1.6 GM/177ML SOLN Take 1 Bottle by mouth. 03/19/24   [provider]  nitroGLYCERIN  (NITROLINGUAL ) 0.4 MG/SPRAY spray Place 1 spray under the tongue every 5 (five) minutes x 3 doses as needed for chest pain. 10/30/23    Donette Ellouise LABOR, FNP  nystatin cream (MYCOSTATIN) Apply 1 Application topically 3 (three) times daily.    [provider]  ondansetron  (ZOFRAN ) 4 MG tablet Take 1 tablet (4 mg total)  by mouth every 6 (six) hours as needed for nausea. 06/21/23   Josette Ade, MD  ondansetron  (ZOFRAN -ODT) 8 MG disintegrating tablet Take 8 mg by mouth. 01/24/23   [provider]  pantoprazole  (PROTONIX ) 40 MG tablet Take 40 mg by mouth daily. 09/24/23   [provider]  phenol (CHLORASEPTIC) 1.4 % LIQD Use as directed 1 spray in the mouth or throat as needed for throat irritation / pain.    [provider]  QUEtiapine  (SEROQUEL ) 50 MG tablet Take 50 mg by mouth at bedtime. 11/19/23   [provider]  senna (SENOKOT) 8.6 MG TABS tablet Take 1 tablet (8.6 mg total) by mouth 2 (two) times daily as needed for mild constipation. 07/02/23   Gregory Edsel Ruth, PA  spironolactone  (ALDACTONE ) 25 MG tablet Take 1 tablet (25 mg total) by mouth daily. Patient taking differently: Take 100 mg by mouth daily. 01/12/24 04/09/24  Caleen Qualia, MD  VENTOLIN  HFA 108 (90 Base) MCG/ACT inhaler Inhale 2 puffs into the lungs every 4 (four) hours as needed for wheezing. 07/16/23   [provider]  XIFAXAN  550 MG TABS tablet Take 550 mg by mouth 2 (two) times daily. 09/24/23   [provider]    Physical Exam:   Vitals:   04/14/24 1100 04/14/24 1130 04/14/24 1200 04/14/24 1230  BP: 119/61 138/66 117/61 122/62  Pulse: (!) 48 (!) 48 (!) 46 (!) 45  Resp: 13 15 14 15   Temp:      TempSrc:      SpO2: 99% 100% 100% 100%  Weight:      Height:         Physical Exam: Blood pressure 122/62, pulse (!) 45, temperature 99.2 F (37.3 C), temperature source Oral, resp. rate 15, height 5' 6 (1.676 m), weight 83.5 kg, SpO2 100%. Gen: No acute distress. Head: Normocephalic, atraumatic. Eyes: Pupils equal, round and reactive to light. Extraocular movements intact.  Sclerae  nonicteric.  Mouth: No pharyngeal exudates or erythema.  Dentures in place. Neck: Supple, no thyromegaly, no lymphadenopathy, no jugular venous distention. Chest: Lungs are clear to auscultation with good air movement. No rales, rhonchi or wheezes.  CV: Heart sounds are regular with an S1, S2. No murmurs, rubs or gallops.  Abdomen: Soft, suprapubic tenderness,, nondistended with normal active bowel sounds. No palpable masses. Extremities: Extremities are without clubbing, or cyanosis. No edema. Pedal pulses 2+.  Skin: Warm and dry.  Neuro: Alert and oriented times 2 (person and place) but able to answer simple questions appropriately; slurred speech, flapping tremors of bilateral hands, grossly nonfocal.  Psych: Insight is good and judgment is appropriate. Mood and affect normal. GU: Suprapubic catheter in place, suprapubic tenderness   Data Review:    Labs: Basic Metabolic Panel: Recent Labs  Lab 04/09/24 1154 04/14/24 0845  NA 136 137  K 3.5 3.7  CL 107 104  CO2 24 23  GLUCOSE 153* 177*  BUN 25* 24*  CREATININE 1.43* 1.63*  CALCIUM  10.2 10.4*   Liver Function Tests: Recent Labs  Lab 04/14/24 0845  AST 40  ALT 20  ALKPHOS 68  BILITOT 2.1*  PROT 7.7  ALBUMIN  2.9*   Recent Labs  Lab 04/14/24 0845  LIPASE 30   Recent Labs  Lab 04/14/24 0845  AMMONIA 69*   CBC: Recent Labs  Lab 04/13/24 0917 04/14/24 0845  WBC 3.8* 4.6  NEUTROABS  --  3.2  HGB 12.0* 12.1*  HCT 36.4* 36.6*  MCV 89.9 88.6  PLT  71* 75*   Cardiac Enzymes: No results for input(s): CKTOTAL, CKMB, CKMBINDEX, TROPONINI in the last 168 hours.  BNP (last 3 results) No results for input(s): PROBNP in the last 8760 hours. CBG: Recent Labs  Lab 04/13/24 0917  GLUCAP 73    Urinalysis    Component Value Date/Time   COLORURINE YELLOW (A) 04/14/2024 0845   APPEARANCEUR CLEAR (A) 04/14/2024 0845   LABSPEC 1.018 04/14/2024 0845   PHURINE 6.0 04/14/2024 0845   GLUCOSEU NEGATIVE  04/14/2024 0845   HGBUR MODERATE (A) 04/14/2024 0845   BILIRUBINUR NEGATIVE 04/14/2024 0845   KETONESUR NEGATIVE 04/14/2024 0845   PROTEINUR NEGATIVE 04/14/2024 0845   NITRITE NEGATIVE 04/14/2024 0845   LEUKOCYTESUR SMALL (A) 04/14/2024 0845      Radiographic Studies: CT ABDOMEN PELVIS W CONTRAST Result Date: 04/14/2024 CLINICAL DATA:  Abdominal pain, acute, nonlocalized Abdominal pain since yesterday. Existing urinary catheter that was changed yesterday. EXAM: CT ABDOMEN AND PELVIS WITH CONTRAST TECHNIQUE: Multidetector CT imaging of the abdomen and pelvis was performed using the standard protocol following bolus administration of intravenous contrast. RADIATION DOSE REDUCTION: This exam was performed according to the departmental dose-optimization program which includes automated exposure control, adjustment of the mA and/or kV according to patient size and/or use of iterative reconstruction technique. CONTRAST:  OMNIPAQUE  IOHEXOL  300 MG/ML  SOLN COMPARISON:  Abdominopelvic CT 06/19/2023. FINDINGS: Lower chest: Interval resolution of bibasilar atelectasis. The lung bases are now clear. There is aortic and coronary artery atherosclerosis and a small pericardial effusion. Hepatobiliary: Morphologic changes of advanced cirrhosis are again noted. No focal liver lesions are identified. There is chronic extrahepatic biliary dilatation status post cholecystectomy. The common hepatic duct measures up to 2.4 cm in diameter. No evidence of choledocholithiasis. Pancreas: Unremarkable. No pancreatic ductal dilatation or surrounding inflammatory changes. Spleen: Normal in size without focal abnormality. Adrenals/Urinary Tract: Both adrenal glands appear normal. Several nonobstructing right renal calculi are again noted. There is no evidence of ureteral calculus, hydronephrosis or perinephric soft tissue stranding. There is mild renal cortical scarring without suspicious focal renal lesion. Percutaneous  suprapubic bladder catheter in place. The urinary bladder is decompressed with probable wall thickening and mild surrounding soft tissue stranding. Stomach/Bowel: No enteric contrast administered. The stomach appears unremarkable for its degree of distension. No evidence of bowel wall thickening, distention or surrounding inflammatory change. 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 A moderate amount of stool is present throughout the colon. Vascular/Lymphatic: There are no enlarged abdominal or pelvic lymph nodes. No acute vascular findings. Minimal aortic atherosclerosis. There is chronic dilatation of the portal vein and a large recanalized left paraumbilical vein with venous collaterals in the right lower quadrant. Reproductive: The prostate gland and seminal vesicles appear normal. Other: As above, enlarging left-sided spigelian hernia containing fat and small bowel. No signs of incarceration or obstruction. No evidence of ascites or pneumoperitoneum. Musculoskeletal: No acute or significant osseous findings. Mild lumbar facet arthropathy. Previously demonstrated generalized subcutaneous edema has improved. IMPRESSION: 1. Percutaneous suprapubic bladder catheter in place with probable bladder wall thickening and surrounding soft tissue stranding. Correlate with urinalysis to exclude cystitis. 2. No other definite acute findings or explanation for the patient's symptoms. 3. 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 No evidence of incarceration or obstruction. 4. Nonobstructing right renal calculi. 5. Morphologic changes of advanced cirrhosis with findings of chronic portal hypertension. Stable chronic extrahepatic biliary dilatation status post cholecystectomy. No focal liver lesions identified. 6.  Aortic Atherosclerosis (ICD10-I70.0). Electronically Signed   By: Elsie Perone M.D.   On: 04/14/2024 10:10   IR CYSTOSTOMY TUBE  PLACEMENT/BLADDER ASPIRATION Result Date: 04/13/2024 INDICATION: 73 year old male with Foley catheter dependence due to chronic urinary incontinence. He presents for placement of a suprapubic catheter. EXAM: Suprapubic catheter placement with ultrasound and fluoroscopic guidance COMPARISON:  None Available. MEDICATIONS: None ANESTHESIA/SEDATION: Moderate (conscious) sedation was employed during this procedure. A total of Versed  2 mg and Fentanyl  50 mcg was administered intravenously by the radiology nurse. Total intra-service moderate Sedation Time: 20 minutes. The patient's level of consciousness and vital signs were monitored continuously by radiology nursing throughout the procedure under my direct supervision. CONTRAST:  10 mL Omnipaque  300-administered into the collecting system(s) FLUOROSCOPY: Radiation Exposure Index (as provided by the fluoroscopic device): 27 mGy Kerma COMPLICATIONS: None immediate. PROCEDURE: Informed written consent was obtained from the patient after a thorough discussion of the procedural risks, benefits and alternatives. All questions were addressed. Maximal Sterile Barrier Technique was utilized including caps, mask, sterile gowns, sterile gloves, sterile drape, hand hygiene and skin antiseptic. A timeout was performed prior to the initiation of the procedure. Ultrasound was used to interrogate the suprapubic region. The bladder is well distended. No evidence of intervening bowel. The median raphe between the lower rectus abdominus muscles was identified. Local anesthesia was attained by infiltration with 1% lidocaine . A small dermatotomy was made. Under real time ultrasound guidance, an 18 gauge trocar needle was advanced through the median raphe and into the bladder. A 0.035 wire was then coiled in the bladder. The needle was removed. An 8 x 80 athletics balloon was advanced coaxially through a 16 [place] tip Foley catheter. The balloon catheter assembly was advanced over  the wire and the balloon positioned across the transabdominal tract and bladder wall. The balloon was then inflated to full effacement. As the balloon was deflated, the balloon catheter assembly was advanced as a unit over the wire pushing both the balloon and Foley catheter into the bladder. The Foley catheter retention balloon was inflated to 10 mL and pulled back against the anterior bladder wall. The athletics balloon was carefully deflated and removed over the wire. Contrast was injected through the Foley catheter confirming its placement within the bladder. Images were obtained and stored for the medical record. The patient tolerated the procedure well. IMPRESSION: Successful placement of a 16 [place] tip Foley catheter via suprapubic placement. Return Interventional Radiology in 6-8 weeks for first catheter exchange over a wire. Electronically Signed   By: Wilkie Lent M.D.   On: 04/13/2024 11:02    EKG: Independently reviewed by me showed normal sinus rhythm.    Assessment/Plan:   Principal Problem:   Acute metabolic encephalopathy Active Problems:   UTI (urinary tract infection)   Liver cirrhosis (HCC)   Acute hepatic encephalopathy (HCC)   AKI (acute kidney injury) (HCC)   Thrombocytopenia (HCC)   Chronic kidney disease, stage 3a (HCC)   COPD (chronic obstructive pulmonary disease) (HCC)   Weakness    Body mass index is 29.7 kg/m.    Acute UTI, recent VP catheter placement on 04/13/2024: Admit to medical telemetry.  Initial treatment with IV cefepime .  Treated with IV ceftriaxone  afterwards.  Follow-up urine culture   Acute metabolic/hepatic encephalopathy: Ammonia was 69 (elevated).  Patient with flapping tremors and underlying liver cirrhosis Continue lactulose  and rifaximin .   AKI on CKD stage IIIa: Creatinine up to 1.63.  Treated with IV fluids.  Creatinine was  1.13 on 02/22/2024.   General Weakness: PT and OT evaluation.  Patient is from a group  home.   Comorbidities include COPD, liver cirrhosis with portal hypertension, chronic thrombocytopenia, history of TIA   Case was discussed with Dr. Nilsa Dade, ED physician.   Other information:   DVT prophylaxis: enoxaparin  (LOVENOX ) injection 40 mg Start: 04/15/24 1000Lovenox  Code Status: DNR. Family Communication: None Disposition Plan: Plan to discharge to group home Consults called: None Admission status: Inpatient    Kellis Mcadam [facility] Pager: Please check [email or web address]   How to contact the [facility] Attending or Consulting provider 7A - 7P or covering provider during after hours 7P -7A, for this patient?   Check the care team in [facility] and look for a) attending/consulting [facility] provider listed and b) the [facility] team listed Log into [email or web address] and use [place]'s universal password to access. If you do not have the password, please contact the hospital operator. Locate the [facility] provider you are looking for under [facility] and page to a number that you can be directly reached. If you still have difficulty reaching the provider, please page the [facility] (Director on Call) for the Hospitalists listed on amion for assistance.  04/14/2024, 12:53 PM

## 2024-04-14 NOTE — ED Triage Notes (Signed)
 Pt to ED via [facility] from [place] [facility]. Pt reports abdominal pain since yesterday. Pt has existing urinary catheter that was changed yesterday. Pt reports he had a procedure done yesterday at the hospital. Pt oriented x3. Denies N/V. Reports BM this AM.   EMS vitals HR 53 BP 145/73 RR19 T 98.4 CBG 164  SPO2 100% RA

## 2024-04-14 NOTE — ED Provider Notes (Signed)
 [facility] Provider Note    Event Date/Time   First MD Initiated Contact with Patient 04/14/24 0825     (approximate)   History   Abdominal Pain   HPI  [name] is a 73 year old male with history of hepatic see cirrhosis with history of hepatic encephalopathy, prior indwelling Foley catheter with retention is suspected secondary to BPH s/p suprapubic catheter placement with IR yesterday presenting to the emergency department for evaluation of abdominal pain.  Patient reports that 2 to 3 hours after he returned home after his catheter placement he had onset of abdominal pain.  Described as sharp, constant.  No associated nausea or vomiting.  Patient was reportedly too weak and in too much discomfort to be able to get out of bed earlier today leading to EMS being called.  Reviewed IR radiology report from yesterday, successful placement of 28 French Foley catheter via suprapubic placement.  Reviewed urology visit from 03/27/2024, plan for IR suprapubic catheter made at that time.  DC procedure visit for suprapubic catheter placement yesterday.  Reviewed discharge summary from 02/22/2024.  At that time patient presented with fall found to be bacteremic secondary to a UTI.      Physical Exam   Triage Vital Signs: ED Triage Vitals  Encounter Vitals Group     BP --      Girls Systolic BP Percentile --      Girls Diastolic BP Percentile --      Boys Systolic BP Percentile --      Boys Diastolic BP Percentile --      Pulse Rate 04/14/24 0831 (!) 52     Resp 04/14/24 0831 14     Temp 04/14/24 0831 99.2 F (37.3 C)     Temp Source 04/14/24 0831 Oral     SpO2 04/14/24 0831 100 %     Weight 04/14/24 0834 184 lb (83.5 kg)     Height 04/14/24 0834 5' 6 (1.676 m)     Head Circumference --      Peak Flow --      Pain Score 04/14/24 0832 10     Pain Loc --      Pain Education --      Exclude from Growth Chart --     Most recent vital signs: Vitals:    04/14/24 1000 04/14/24 1030  BP: 112/79 128/65  Pulse: (!) 48 (!) 49  Resp: 17 14  Temp:    SpO2: 100% 100%     General: Awake, interactive  CV:  Bradycardic with regular rhythm, good peripheral perfusion Resp:  Unlabored respirations Abd:  Nondistended, soft, generalized tenderness palpation.  Dressing in place over the lower abdomen overlying suprapubic catheter, saturated with small amount of blood, dark yellow urine in catheter bag, no leakage from penile area Neuro:  Symmetric facial movement, fluid speech, slow to respond, able to respond to basic questions, tells me the year is 19 something but is able to tell me that the month is August, moving extremities spontaneously, asterixis present   ED Results / Procedures / Treatments   Labs (all labs ordered are listed, but only abnormal results are displayed) Labs Reviewed  CBC WITH DIFFERENTIAL/PLATELET - Abnormal; Notable for the following components:      Result Value   RBC 4.13 (*)    Hemoglobin 12.1 (*)    HCT 36.6 (*)    RDW 15.8 (*)    Platelets 75 (*)    All other  components within normal limits  COMPREHENSIVE METABOLIC PANEL WITH GFR - Abnormal; Notable for the following components:   Glucose, Bld 177 (*)    BUN 24 (*)    Creatinine, Ser 1.63 (*)    Calcium  10.4 (*)    Albumin  2.9 (*)    Total Bilirubin 2.1 (*)    GFR, Estimated 44 (*)    All other components within normal limits  AMMONIA - Abnormal; Notable for the following components:   Ammonia 69 (*)    All other components within normal limits  URINALYSIS, W/ REFLEX TO CULTURE (INFECTION SUSPECTED) - Abnormal; Notable for the following components:   Color, Urine YELLOW (*)    APPearance CLEAR (*)    Hgb urine dipstick MODERATE (*)    Leukocytes,Ua SMALL (*)    Bacteria, UA RARE (*)    All other components within normal limits  URINE CULTURE  LIPASE, BLOOD     EKG EKG independently reviewed and interpreted by myself demonstrates:  EKG demonstrates  sinus rhythm at a rate of 52, PR 192, QRS 124, QTc 426, nonspecific ST changes, no STEMI  RADIOLOGY Imaging independently reviewed and interpreted by myself demonstrates:  CT A/P demonstrates appropriate SPC placement with associated wall thickening concerning   PROCEDURES:  Critical Care performed: Yes, see critical care procedure note(s)  CRITICAL CARE Performed by: Nilsa Dade   Total critical care time: 32 minutes  Critical care time was exclusive of separately billable procedures and treating other patients.  Critical care was necessary to treat or prevent imminent or life-threatening deterioration.  Critical care was time spent personally by me on the following activities: development of treatment plan with patient and/or surrogate as well as nursing, discussions with consultants, evaluation of patient's response to treatment, examination of patient, obtaining history from patient or surrogate, ordering and performing treatments and interventions, ordering and review of laboratory studies, ordering and review of radiographic studies, pulse oximetry and re-evaluation of patient's condition.   Procedures   MEDICATIONS ORDERED IN ED: Medications  iohexol  (OMNIPAQUE ) 300 MG/ML solution 100 mL (100 mLs Intravenous Contrast Given 04/14/24 0917)  fentaNYL  (SUBLIMAZE ) injection 50 mcg (50 mcg Intravenous Given 04/14/24 1007)  ceFEPIme  (MAXIPIME ) 2 g in sodium chloride  0.9 % 100 mL IVPB (2 g Intravenous New Bag/Given 04/14/24 1029)  lactulose  (CHRONULAC ) 10 GM/15ML solution 10 g (10 g Oral Given 04/14/24 1030)     IMPRESSION / MDM / ASSESSMENT AND PLAN / ED COURSE  I reviewed the triage vital signs and the nursing notes.  Differential diagnosis includes, but is not limited to, dislodged other complication from recent suprapubic catheter placement, colitis, diverticulitis, other acute intra-abdominal process, recurrent hepatic encephalopathy, anemia, electrolyte abnormality  Patient's  presentation is most consistent with acute presentation with potential threat to life or bodily function.  73 year old male presenting with weakness and abdominal pain in the setting of SPC  placement yesterday.  Stable vitals on presentation.  Will obtain labs, CT, urine to further evaluate.  Labs with normal white blood cell count, stable anemia, CMP with mild AKI with creatinine 1.63, slight T. bili increase, but otherwise normal LFTs.  Urinalysis is concerning for infection with 21-50 white blood cells, small leukocyte esterase Otte clean sample.  There is a small amount of blood which I suspect is likely related to recent procedure.  Ammonia level is elevated at 69.  With asterixis on exam, presentation is concerning for hepatic encephalopathy in the setting of UTI.  Urine culture ordered.  Patient ordered  for cefepime  in the setting of indwelling catheter.  CT demonstrates findings concerning for cystitis.  Did not patient is appropriate for admission.  Will reach out to hospitalist team.  Clinical Course as of 04/14/24 1106  Tue Apr 14, 2024  1102 Case discussed with hospitalist team. They will evaluate for anticipated admission.  [NR]    Clinical Course User Index [NR] Levander Slate, MD     FINAL CLINICAL IMPRESSION(S) / ED DIAGNOSES   Final diagnoses:  Acute cystitis with hematuria  Hepatic encephalopathy (HCC)     Rx / DC Orders   ED Discharge Orders     None        Note:  This document was prepared using Dragon voice recognition software and may include unintentional dictation errors.   Levander Slate, MD 04/14/24 1106

## 2024-04-15 DIAGNOSIS — I5032 Chronic diastolic (congestive) heart failure: Secondary | ICD-10-CM | POA: Diagnosis present

## 2024-04-15 DIAGNOSIS — I2510 Atherosclerotic heart disease of native coronary artery without angina pectoris: Secondary | ICD-10-CM | POA: Diagnosis present

## 2024-04-15 DIAGNOSIS — K766 Portal hypertension: Secondary | ICD-10-CM | POA: Diagnosis present

## 2024-04-15 DIAGNOSIS — I130 Hypertensive heart and chronic kidney disease with heart failure and stage 1 through stage 4 chronic kidney disease, or unspecified chronic kidney disease: Secondary | ICD-10-CM | POA: Diagnosis present

## 2024-04-15 DIAGNOSIS — D631 Anemia in chronic kidney disease: Secondary | ICD-10-CM | POA: Diagnosis present

## 2024-04-15 DIAGNOSIS — R531 Weakness: Secondary | ICD-10-CM | POA: Diagnosis not present

## 2024-04-15 DIAGNOSIS — N1831 Chronic kidney disease, stage 3a: Secondary | ICD-10-CM | POA: Diagnosis present

## 2024-04-15 DIAGNOSIS — W19XXXA Unspecified fall, initial encounter: Secondary | ICD-10-CM | POA: Diagnosis present

## 2024-04-15 DIAGNOSIS — R7881 Bacteremia: Secondary | ICD-10-CM | POA: Diagnosis present

## 2024-04-15 DIAGNOSIS — Z515 Encounter for palliative care: Secondary | ICD-10-CM | POA: Diagnosis not present

## 2024-04-15 DIAGNOSIS — Z66 Do not resuscitate: Secondary | ICD-10-CM | POA: Diagnosis present

## 2024-04-15 DIAGNOSIS — K7460 Unspecified cirrhosis of liver: Secondary | ICD-10-CM | POA: Diagnosis present

## 2024-04-15 DIAGNOSIS — I272 Pulmonary hypertension, unspecified: Secondary | ICD-10-CM | POA: Diagnosis present

## 2024-04-15 DIAGNOSIS — N3001 Acute cystitis with hematuria: Secondary | ICD-10-CM | POA: Diagnosis present

## 2024-04-15 DIAGNOSIS — J4489 Other specified chronic obstructive pulmonary disease: Secondary | ICD-10-CM | POA: Diagnosis present

## 2024-04-15 DIAGNOSIS — N179 Acute kidney failure, unspecified: Secondary | ICD-10-CM | POA: Diagnosis present

## 2024-04-15 DIAGNOSIS — K7682 Hepatic encephalopathy: Secondary | ICD-10-CM | POA: Diagnosis present

## 2024-04-15 DIAGNOSIS — Z8616 Personal history of COVID-19: Secondary | ICD-10-CM | POA: Diagnosis not present

## 2024-04-15 DIAGNOSIS — B964 Proteus (mirabilis) (morganii) as the cause of diseases classified elsewhere: Secondary | ICD-10-CM | POA: Diagnosis present

## 2024-04-15 DIAGNOSIS — G8929 Other chronic pain: Secondary | ICD-10-CM | POA: Diagnosis present

## 2024-04-15 DIAGNOSIS — Z5900 Homelessness unspecified: Secondary | ICD-10-CM | POA: Diagnosis not present

## 2024-04-15 DIAGNOSIS — D696 Thrombocytopenia, unspecified: Secondary | ICD-10-CM | POA: Diagnosis present

## 2024-04-15 DIAGNOSIS — T83510A Infection and inflammatory reaction due to cystostomy catheter, initial encounter: Secondary | ICD-10-CM | POA: Diagnosis present

## 2024-04-15 DIAGNOSIS — N401 Enlarged prostate with lower urinary tract symptoms: Secondary | ICD-10-CM | POA: Diagnosis present

## 2024-04-15 DIAGNOSIS — E785 Hyperlipidemia, unspecified: Secondary | ICD-10-CM | POA: Diagnosis present

## 2024-04-15 DIAGNOSIS — Y846 Urinary catheterization as the cause of abnormal reaction of the patient, or of later complication, without mention of misadventure at the time of the procedure: Secondary | ICD-10-CM | POA: Diagnosis present

## 2024-04-15 DIAGNOSIS — R4182 Altered mental status, unspecified: Secondary | ICD-10-CM | POA: Diagnosis not present

## 2024-04-15 DIAGNOSIS — G9341 Metabolic encephalopathy: Secondary | ICD-10-CM | POA: Diagnosis present

## 2024-04-15 DIAGNOSIS — N3000 Acute cystitis without hematuria: Secondary | ICD-10-CM | POA: Diagnosis not present

## 2024-04-15 DIAGNOSIS — E1122 Type 2 diabetes mellitus with diabetic chronic kidney disease: Secondary | ICD-10-CM | POA: Diagnosis present

## 2024-04-15 LAB — BASIC METABOLIC PANEL WITH GFR
Anion gap: 6 (ref 5–15)
BUN: 24 mg/dL — ABNORMAL HIGH (ref 8–23)
CO2: 24 mmol/L (ref 22–32)
Calcium: 10 mg/dL (ref 8.9–10.3)
Chloride: 106 mmol/L (ref 98–111)
Creatinine, Ser: 1.36 mg/dL — ABNORMAL HIGH (ref 0.61–1.24)
GFR, Estimated: 55 mL/min — ABNORMAL LOW (ref >60)
Glucose, Bld: 109 mg/dL — ABNORMAL HIGH (ref 70–99)
Potassium: 4 mmol/L (ref 3.5–5.1)
Sodium: 136 mmol/L (ref 135–145)

## 2024-04-15 LAB — CBC
HCT: 33 % — ABNORMAL LOW (ref 39.0–52.0)
Hemoglobin: 11.2 g/dL — ABNORMAL LOW (ref 13.0–17.0)
MCH: 29.6 pg (ref 26.0–34.0)
MCHC: 33.9 g/dL (ref 30.0–36.0)
MCV: 87.3 fL (ref 80.0–100.0)
Platelets: 65 K/uL — ABNORMAL LOW (ref 150–400)
RBC: 3.78 MIL/uL — ABNORMAL LOW (ref 4.22–5.81)
RDW: 15.8 % — ABNORMAL HIGH (ref 11.5–15.5)
WBC: 5.5 K/uL (ref 4.0–10.5)
nRBC: 0 % (ref 0.0–0.2)

## 2024-04-15 LAB — GLUCOSE, CAPILLARY: Glucose-Capillary: 123 mg/dL — ABNORMAL HIGH (ref 70–99)

## 2024-04-15 LAB — URINE CULTURE

## 2024-04-15 NOTE — Progress Notes (Signed)
 Patient requested assistance to ambulate to bathroom in order to have a bowel movement.  He refused to use the walker; he was impulsive and unaware that he had a suprapubic catheter bag attached.  Lauraine Phlegm, CNA was holding him steady by his arm and also had a hold on his gown.  Patient became unbalanced and starting to lean forward.  Lauraine attempted to steady him but they both fell.  This was an assisted fall and that patient reported that he did not hurt himself.

## 2024-04-15 NOTE — Progress Notes (Signed)
   04/15/24 0920  What Happened  Was fall witnessed? Yes  Who witnessed fall? Lauraine Phlegm, CNA  Patients activity before fall bathroom-assisted  Point of contact other (comment) (non - assisted fall)  Was patient injured? No  Provider Notification  Provider Name/Title Dr Karyle  Date Provider Notified 04/15/24  Time Provider Notified 0920  Method of Notification Page  Notification Reason Fall  Provider response No new orders  Date of Provider Response 04/15/24  Time of Provider Response [phone]  Follow Up  Family notified Yes - comment Jacalyn Later from (group home))  Time family notified 0945  Additional tests No  Simple treatment Other (comment)  Adult Fall Risk Assessment  Risk Factor Category (scoring not indicated) Fall has occurred during this admission (document High fall risk)  Age 73  Fall History: Fall within 6 months prior to admission 0  Elimination; Bowel and/or Urine Incontinence 0  Elimination; Bowel and/or Urine Urgency/Frequency 2  Medications: includes PCA/Opiates, Anti-convulsants, Anti-hypertensives, Diuretics, Hypnotics, Laxatives, Sedatives, and Psychotropics 5  Patient Care Equipment 2  Mobility-Assistance 2  Mobility-Gait 2  Mobility-Sensory Deficit 0  Altered awareness of immediate physical environment 1  Impulsiveness 0  Lack of understanding of one's physical/cognitive limitations 4  Total Score 20  Patient Fall Risk Level High fall risk  Adult Fall Risk Interventions  Required Bundle Interventions *See Row Information* High fall risk - low, moderate, and high requirements implemented  Additional Interventions Use of appropriate toileting equipment (bedpan, BSC, etc.)  Fall intervention(s) refused/Patient educated regarding refusal Bed alarm;Nonskid socks;Yellow bracelet;Supervision while toileting/edge of bed sitting;Open door if unsupervised  Screening for Fall Injury Risk (To be completed on HIGH fall risk patients) - Assessing Need for Floor  Mats  Risk For Fall Injury- Criteria for Floor Mats Confusion/dementia (+NuDESC, CIWA, TBI, etc.)  Will Implement Floor Mats Yes  Vitals  Temp Source Oral  BP 121/83  MAP (mmHg) 95  BP Method Automatic  Patient Position (if appropriate) Sitting  Pulse Rate 70  Resp 20  Oxygen Therapy  SpO2 99 %  O2 Device Room Air  Pain Assessment  Pain Scale 0-10  Pain Score 0  PCA/Epidural/Spinal Assessment  Respiratory Pattern Regular;Unlabored  Neurological  Neuro (WDL) X  Level of Consciousness Alert  Orientation Level Oriented to person;Oriented to place;Disoriented to time;Disoriented to situation  Cognition Poor attention/concentration;Poor judgement;Poor safety awareness;Memory impairment  Speech Clear  Neuro Symptoms Forgetful  Glasgow Coma Scale  Eye Opening 4  Best Verbal Response (NON-intubated) 4  Best Motor Response 6  Glasgow Coma Scale Score 14  Musculoskeletal  Musculoskeletal (WDL) X  Assistive Device Front wheel walker  Generalized Weakness Yes  Weight Bearing Restrictions Per Provider Order No  Integumentary  Integumentary (WDL) X  Skin Color Appropriate for ethnicity  Skin Condition Dry

## 2024-04-15 NOTE — Consult Note (Signed)
 Consultation Note Date: 04/15/2024   Patient Name: [name]  DOB: 1950-12-18  MRN: [id number]  Age / Sex: 73 y.o., male  PCP: [facility] Referring Physician: Leesa Kast, DO  Reason for Consultation: Establishing goals of care  HPI/Patient Profile: 73 y.o. male  with past medical history of hepatitis C, liver cirrhosis, portal hypertension, hypertension, COPD, hyperlipidemia, diabetes mellitus, depression, history of TIA, multiple admissions in the past for hepatic encephalopathy, discharged from the hospital in June 2025. Subsequently to UTI and Klebsiella and Proteus bacteremia. He presents to [facility] ED via EMS from a group home on 04/14/2024 with abdominal pain and generalized weakness.   Worth to note that he recently had a suprapubic catheter placed on 04/13/2024 due to chronic urinary retention.    A review of his chart shows that patient has has 9 inpatient admissions and 6 ED visits in the last 6 months.   Chart review indicates that the patient previously had a DNR order, which was apparently rescinded during his recent suprapubic catheter procedure on 04/13/24. Additionally, the patient has a history of multiple admissions for altered mental status. Due to his progressive chronic disease and impaired capacity for complex medical decision-making, initiation of DSS guardianship was pursued in March 2025.  PMT has been consulted to assist with goals of care conversation. Patient/Family face treatment option decisions, advanced directive decisions and anticipatory care needs.   Family face treatment option decision, advance directive decisions and anticipatory care needs.   Clinical Assessment and Goals of Care:  We have reviewed medical records including EPIC notes, labs and imaging, assessed the patient and then visited with patient at bedside to attempt to discuss diagnosis prognosis, GOC, EOL wishes, disposition and  options.  We introduced Palliative Medicine as specialized medical care for people living with serious illness. It focuses on providing relief from the symptoms and stress of a serious illness. The goal is to improve quality of life for both the patient and the family.  We visited the patient at the bedside today. He is awake, chronically ill-appearing, and not in any acute distress. The patient is pleasant and able to express his needs; however, further assessment revealed intermittent disorientation. He is unable to sustain a meaningful conversation and frequently shifts topics. Given his current cognitive status, likely related to hepatic encephalopathy, he lacks decision-making capacity regarding goals of care and code status.  Historically, the patient designated himself as DNR on 08/02/2023 when he had decision-making capacity. However, during his most recent suprapubic catheter placement, the DNR order was rescinded (unsure of reason), and he is currently listed as full code. Although he has expressed a desire to continue living, he is unable to appreciate the severity of his condition or fully understand the risks and benefits associated with full resuscitation. Given this, we recommend to maintain him as DNR/DNI status as per his previous desire.   Medical History Review and Family/Patient Understanding:   [name] is a 73 y.o. male  with past medical history of hepatitis C, liver cirrhosis, portal hypertension, hypertension, COPD, hyperlipidemia, diabetes mellitus, depression, history of TIA, multiple admissions in the past for hepatic encephalopathy, discharged from the hospital in June 2025. Subsequently to UTI and Klebsiella and Proteus bacteremia. He presents to [facility] ED via EMS from a group home on 04/14/2024 with abdominal pain and generalized weakness.   The patient appears to be doing well overall. His mentation remains impaired due to ongoing hepatic encephalopathy. He is aware that he  is hospitalized for abdominal pain and weakness but is unable to provide further details or insight into his acute and chronic medical conditions. He is unable to participate in goals of care discussions and carry out medical decision making independently at this time.   Worth to note that the patient has a history of multiple admissions for altered mental status. Due to his progressive chronic disease and impaired capacity for complex medical decision-making, initiation of DSS guardianship was pursued in March 2025.  Social History: The patient currently resides in a group home. According to [name] Later, the group home owner and caregiver, the patient has been living there for approximately six months. [name] reports that the patient has both good and bad days and describes him as "chill and easygoing." Additionally, [name] noted that the patient does not have any known relatives who maintain contact. Although the patient has a daughter, she has not been in touch with him.  Functional and Nutritional State: The patient is ambulatory with the assistance of a cane or walker at baseline. Caregiver [name] Later notes a recent decline in the patient's functional status. The patient reports that his appetite is typically good, but it has recently decreased due to abdominal pain and discomfort. He experienced a fall today, with no injuries reported.  Palliative Symptoms: Generalized weakness, abdominal pain and discomfort, intermittent confusion  Advance Directives: Per chart review, patient currently does not have an appointed Horticulturist, commercial. Patient has a MOST and DNR form in place, signed on 08/02/2023.   Code Status: DNR/DNI  Discussion:  We visited the patient at his bedside today; no family members were present. The patient appears to be doing well. The RN reports that he is more alert today, though he continues to experience periods of confusion. The RN also noted that the patient had  a fall earlier today, but fortunately, there were no injuries. The patient was able to provide only minimal information regarding his hospitalization and was unable to participate in a meaningful or clear discussion about goals of care or code status.  We contacted [name] Later, Manager of the [facility], to gather additional information about the patient's functioning in his home environment and to obtain a status update on the DSS guardianship application initiated in March 2025. [name] reported that the patient has been residing in the group home for approximately six months and experiences both good and bad days, with intermittent confusion. He is compliant with his medication regimen. [name] stated that the guardianship process is still ongoing; a DSS representative visited the patient about three weeks ago for an evaluation, but there is no current update on the application status. [name] will follow up with DSS and provide an update once available. He is not aware of any next of kin visiting the patient. He mentioned that the patient has a daughter who does not reach out frequently, and he has not met her in person nor does he have her contact information.  Discussed the patient's code status with the attending physician. It was agreed to honor the prior DNR order dated 08/02/2023, established when the patient had decision-making capacity.   SUMMARY OF RECOMMENDATIONS    Code Status: Re-establish prior code status as DNR/DNI. Symptom Management (per attending): Acetaminophen  650 mg PO every 8 hours as needed for mild pain Oxycodone  5 mg PO every 6 hours as needed for moderate pain Senna 8.6 mg PO twice daily as needed for constipation Care Coordination: Recommend TOC to follow up  with DSS regarding guardianship, as the patient lacks decision-making capacity. Guardianship initiation was made in March. Continue to provide psychosocial and emotional support. PMT will  continue to monitor and provide support.   Palliative Prophylaxis:  Delirium Protocol, Frequent Pain Assessment, Oral Care, and Palliative Wound Care   Prognosis:  The patient's prognosis is relatively poor due to multiple comorbidities, most notably liver cirrhosis. He has a history of multiple admissions for hepatic encephalopathy, further indicating the severity and progression of his underlying disease.  Discharge Planning: To Be Determined      Primary Diagnoses: Present on Admission:  Acute metabolic encephalopathy  Acute hepatic encephalopathy (HCC)  AKI (acute kidney injury) (HCC)  Chronic kidney disease, stage 3a (HCC)  COPD (chronic obstructive pulmonary disease) (HCC)  Liver cirrhosis (HCC)  Thrombocytopenia (HCC)  UTI (urinary tract infection)  Altered mental status    Physical Exam Vitals and nursing note reviewed.  HENT:     Head: Normocephalic and atraumatic.  Cardiovascular:     Rate and Rhythm: Normal rate.  Pulmonary:     Effort: Pulmonary effort is normal.  Abdominal:     Comments: Tender to palpation lower abdomen   Genitourinary:    Comments: Noted for presence of suprapubic catheter.  Skin:    General: Skin is warm and dry.  Neurological:     Mental Status: He is alert.     Comments: Alert with intermittent periods of disorientation.      Vital Signs: BP 121/83 (BP Location: Left Arm)   Pulse 70   Temp 98.7 F (37.1 C) (Oral)   Resp 18   Ht 5' 6 (1.676 m)   Wt 83.5 kg   SpO2 99%   BMI 29.70 kg/m  Pain Scale: 0-10   Pain Score: 10-Worst pain ever   SpO2: SpO2: 99 % O2 Device:SpO2: 99 % O2 Flow Rate: .    Palliative Assessment/Data: 40%    Total time: I spent 65 minutes in the care of the patient today in the above activities and documenting the encounter.   Kathlyne JULIANNA Tracie Mickey, NP  Palliative Medicine Team Team phone # [phone]  Thank you for allowing the Palliative Medicine Team to assist in the care of this  patient. Please utilize secure chat with additional questions, if there is no response within 30 minutes please call the above phone number.  Palliative Medicine Team providers are available by phone from 7am to 7pm daily and can be reached through the team cell phone.  Should this patient require assistance outside of these hours, please call the patient's attending physician.

## 2024-04-15 NOTE — Care Management Obs Status (Signed)
 MEDICARE OBSERVATION STATUS NOTIFICATION   Patient Details  Name: [name] MRN: [id number] Date of Birth: Nov 26, 1950   Medicare Observation Status Notification Given:  Yes    [name] 04/15/2024, 11:36 AM

## 2024-04-15 NOTE — Progress Notes (Signed)
 PROGRESS NOTE    [name]  FMW:[id number] DOB: 1951/05/31 DOA: 04/14/2024 PCP: [facility]  Chief Complaint  Patient presents with   Abdominal Pain    Hospital Course:  [name] is a 73 year old male with hepatitis C, liver cirrhosis, portal hypertension, COPD, hypertension, hyperlipidemia, diabetes, depression, history of TIA, frequent admissions for hepatic encephalopathy, recently discharged in June 2025 at that time for UTI and Klebsiella/Proteus bacteremia.  He had suprapubic catheter placed at [facility] on 04/13/2024.  He presents from his group home on this admission due to abdominal pain and generalized weakness.  On arrival to the ED heart rate 49, temperature 99.  Patient was acutely confused and urinalysis consistent with possible infection.  He was admitted for acute UTI and hepatic encephalopathy  Subjective: Bedside RN reports patient sustained a fall when ambulating to the bathroom this morning.  Reports he was refusing to ambulate with walker. At the time my evaluation patient is drowsy but with significant prompting he is able to accurately report self, place, location, year, president.  Objective: Vitals:   04/14/24 2125 04/15/24 0439 04/15/24 0920 04/15/24 0925  BP: (!) 152/77 125/77 121/83 121/83  Pulse: (!) 55 (!) 51 70 70  Resp: 17 18 20 18   Temp: 98.4 F (36.9 C) 98.7 F (37.1 C)    TempSrc:  Oral Oral   SpO2: 98% 98% 99% 99%  Weight:      Height:        Intake/Output Summary (Last 24 hours) at 04/15/2024 1609 Last data filed at 04/15/2024 1300 Gross per 24 hour  Intake 315 ml  Output 1050 ml  Net -735 ml   Filed Weights   04/14/24 0834  Weight: 83.5 kg    Examination: General exam: Appears calm and comfortable, NAD  Respiratory system: No work of breathing, symmetric chest wall expansion Cardiovascular system: S1 & S2 heard, RRR.  Gastrointestinal system: Abdomen is nondistended, soft and nontender, prepubic catheter in  place Neuro: Alert and oriented. No focal neurological deficits. Extremities: Symmetric, expected ROM Skin: No rashes, lesions Psychiatry: Demonstrates appropriate judgement and insight. Mood & affect appropriate for situation.   Assessment & Plan:  Principal Problem:   Acute metabolic encephalopathy Active Problems:   UTI (urinary tract infection)   Liver cirrhosis (HCC)   Acute hepatic encephalopathy (HCC)   AKI (acute kidney injury) (HCC)   Thrombocytopenia (HCC)   Chronic kidney disease, stage 3a (HCC)   COPD (chronic obstructive pulmonary disease) (HCC)   Weakness   Altered mental status    Acute UTI secondary to suprapubic catheter - SP cath placed 8/18 - Initial urine culture is polymicrobial - Have ordered for recollection - Status post cefepime  x 1.  Continue with ceftriaxone  for now - Follow new urine culture  Hepatic encephalopathy - Ammonia 69 on arrival with flapping tremor - Continue lactulose  and rifaximin  - Mental status waxes and wanes - AO x 4 for me this morning, bedside RN reports ongoing difficulty with mental status.  Will continue to monitor until he resolves baseline.  Cirrhosis Portal hypertension Chronic thrombocytopenia Anemia - Continue home meds  AKI on CKD stage IIIa - Baseline creatinine near 1.1, creatinine elevated to 1.63 on arrival - Improving now - Status post IV fluids - Encourage p.o. hydration now - Continue to avoid nephrotoxic medications when able - Trend CMP  Generalized weakness - PT/OT eval - Patient comes from group home.  Has previously been resistant to rehab.  Pending PT evals  this admission  COPD - Not currently in exacerbation - As needed DuoNebs  History of TIA - Continue home meds - DVT prophylaxis: Lovenox    Code Status: Full Code Disposition:  Inpatient, pending resolution in mental status. Likely back to group home tomorrow  Consultants:    Procedures:    Antimicrobials:  Anti-infectives  (From admission, onward)    Start     Dose/Rate Route Frequency Ordered Stop   04/14/24 2200  ceFEPIme  (MAXIPIME ) 2 g in sodium chloride  0.9 % 100 mL IVPB        2 g 200 mL/hr over 30 Minutes Intravenous  Once 04/14/24 1240 04/14/24 2215   04/14/24 2200  rifaximin  (XIFAXAN ) tablet 550 mg        550 mg Oral 2 times daily 04/14/24 1244     04/14/24 2200  cefTRIAXone  (ROCEPHIN ) 1 g in sodium chloride  0.9 % 100 mL IVPB        1 g 200 mL/hr over 30 Minutes Intravenous Every 24 hours 04/14/24 1244     04/14/24 1030  ceFEPIme  (MAXIPIME ) 2 g in sodium chloride  0.9 % 100 mL IVPB        2 g 200 mL/hr over 30 Minutes Intravenous  Once 04/14/24 1018 04/14/24 1110       Data Reviewed: I have personally reviewed following labs and imaging studies CBC: Recent Labs  Lab 04/13/24 0917 04/14/24 0845 04/15/24 0522  WBC 3.8* 4.6 5.5  NEUTROABS  --  3.2  --   HGB 12.0* 12.1* 11.2*  HCT 36.4* 36.6* 33.0*  MCV 89.9 88.6 87.3  PLT 71* 75* 65*   Basic Metabolic Panel: Recent Labs  Lab 04/09/24 1154 04/14/24 0845 04/15/24 0522  NA 136 137 136  K 3.5 3.7 4.0  CL 107 104 106  CO2 24 23 24   GLUCOSE 153* 177* 109*  BUN 25* 24* 24*  CREATININE 1.43* 1.63* 1.36*  CALCIUM  10.2 10.4* 10.0   GFR: Estimated Creatinine Clearance: 48.3 mL/min (A) (by C-G formula based on SCr of 1.36 mg/dL (H)). Liver Function Tests: Recent Labs  Lab 04/14/24 0845  AST 40  ALT 20  ALKPHOS 68  BILITOT 2.1*  PROT 7.7  ALBUMIN  2.9*   CBG: Recent Labs  Lab 04/13/24 0917 04/15/24 0931  GLUCAP 73 123*    Recent Results (from the past 240 hours)  Urine Culture     Status: Abnormal   Collection Time: 04/14/24  8:45 AM   Specimen: Urine, Random  Result Value Ref Range Status   Specimen Description   Final    URINE, RANDOM Performed at [facility], [address] [place], [place] [address]    Special Requests   Final    NONE Reflexed from [id number] Performed at [facility], [address] [place], [place] [address]    Culture MULTIPLE SPECIES PRESENT, SUGGEST RECOLLECTION (A)  Final   Report Status 04/15/2024 FINAL  Final     Radiology Studies: CT ABDOMEN PELVIS W CONTRAST Result Date: 04/14/2024 CLINICAL DATA:  Abdominal pain, acute, nonlocalized Abdominal pain since yesterday. Existing urinary catheter that was changed yesterday. EXAM: CT ABDOMEN AND PELVIS WITH CONTRAST TECHNIQUE: Multidetector CT imaging of the abdomen and pelvis was performed using the standard protocol following bolus administration of intravenous contrast. RADIATION DOSE REDUCTION: This exam was performed according to the departmental dose-optimization program which includes automated exposure control, adjustment of the mA and/or kV according to patient size and/or use of iterative reconstruction technique. CONTRAST:  OMNIPAQUE  IOHEXOL  300 MG/ML  SOLN COMPARISON:  Abdominopelvic CT 06/19/2023. FINDINGS: Lower chest: Interval resolution of bibasilar atelectasis. The lung bases are now clear. There is aortic and coronary artery atherosclerosis and a small pericardial effusion. Hepatobiliary: Morphologic changes of advanced cirrhosis are again noted. No focal liver lesions are identified. There is chronic extrahepatic biliary dilatation status post cholecystectomy. The common hepatic duct measures up to 2.4 cm in diameter. No evidence of choledocholithiasis. Pancreas: Unremarkable. No pancreatic ductal dilatation or surrounding inflammatory changes. Spleen: Normal in size without focal abnormality. Adrenals/Urinary Tract: Both adrenal glands appear normal. Several nonobstructing right renal calculi are again noted. There is no evidence of ureteral calculus, hydronephrosis or perinephric soft tissue stranding. There is mild renal cortical scarring without suspicious focal renal lesion. Percutaneous suprapubic bladder catheter in place. The urinary bladder is decompressed with probable wall thickening and  mild surrounding soft tissue stranding. Stomach/Bowel: No enteric contrast administered. The stomach appears unremarkable for its degree of distension. No evidence of bowel wall thickening, distention or surrounding inflammatory change. 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 A moderate amount of stool is present throughout the colon. Vascular/Lymphatic: There are no enlarged abdominal or pelvic lymph nodes. No acute vascular findings. Minimal aortic atherosclerosis. There is chronic dilatation of the portal vein and a large recanalized left paraumbilical vein with venous collaterals in the right lower quadrant. Reproductive: The prostate gland and seminal vesicles appear normal. Other: As above, enlarging left-sided spigelian hernia containing fat and small bowel. No signs of incarceration or obstruction. No evidence of ascites or pneumoperitoneum. Musculoskeletal: No acute or significant osseous findings. Mild lumbar facet arthropathy. Previously demonstrated generalized subcutaneous edema has improved. IMPRESSION: 1. Percutaneous suprapubic bladder catheter in place with probable bladder wall thickening and surrounding soft tissue stranding. Correlate with urinalysis to exclude cystitis. 2. No other definite acute findings or explanation for the patient's symptoms. 3. 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 No evidence of incarceration or obstruction. 4. Nonobstructing right renal calculi. 5. Morphologic changes of advanced cirrhosis with findings of chronic portal hypertension. Stable chronic extrahepatic biliary dilatation status post cholecystectomy. No focal liver lesions identified. 6.  Aortic Atherosclerosis (ICD10-I70.0). Electronically Signed   By: Elsie Perone M.D.   On: 04/14/2024 10:10    Scheduled Meds:  aspirin   81 mg Oral Daily   atorvastatin   40 mg Oral Daily   citalopram   20 mg Oral Daily   enoxaparin   (LOVENOX ) injection  40 mg Subcutaneous Q24H   feeding supplement  237 mL Oral TID BM   gabapentin   300 mg Oral BID   lactulose   45 g Oral TID   pantoprazole   40 mg Oral Daily   QUEtiapine   50 mg Oral QHS   rifaximin   550 mg Oral BID   Continuous Infusions:  cefTRIAXone  (ROCEPHIN )  IV 1 g (04/14/24 2224)     LOS: 1 day  MDM: Patient is high risk for one or more organ failure.  They necessitate ongoing hospitalization for continued IV therapies and subsequent lab monitoring. Total time spent interpreting labs and vitals, reviewing the medical record, coordinating care amongst consultants and care team members, directly assessing and discussing care with the patient and/or family: 55 min  Jenella Craigie, DO [facility]  To contact the attending physician between 7A-7P please use Epic Chat. To contact the covering physician during after hours 7P-7A, please review Amion.  04/15/2024, 4:09 PM   *This document has been  created with the assistance of dictation software. Please excuse typographical errors. *

## 2024-04-16 ENCOUNTER — Other Ambulatory Visit: Payer: Self-pay

## 2024-04-16 DIAGNOSIS — G9341 Metabolic encephalopathy: Secondary | ICD-10-CM | POA: Diagnosis not present

## 2024-04-16 DIAGNOSIS — N179 Acute kidney failure, unspecified: Secondary | ICD-10-CM

## 2024-04-16 DIAGNOSIS — R4182 Altered mental status, unspecified: Secondary | ICD-10-CM | POA: Diagnosis not present

## 2024-04-16 DIAGNOSIS — R531 Weakness: Secondary | ICD-10-CM

## 2024-04-16 DIAGNOSIS — D696 Thrombocytopenia, unspecified: Secondary | ICD-10-CM

## 2024-04-16 DIAGNOSIS — N1831 Chronic kidney disease, stage 3a: Secondary | ICD-10-CM

## 2024-04-16 DIAGNOSIS — K7460 Unspecified cirrhosis of liver: Secondary | ICD-10-CM | POA: Diagnosis not present

## 2024-04-16 DIAGNOSIS — J439 Emphysema, unspecified: Secondary | ICD-10-CM

## 2024-04-16 DIAGNOSIS — N3000 Acute cystitis without hematuria: Secondary | ICD-10-CM

## 2024-04-16 DIAGNOSIS — K7682 Hepatic encephalopathy: Secondary | ICD-10-CM

## 2024-04-16 LAB — CBC WITH DIFFERENTIAL/PLATELET
Abs Granulocyte: 3.7 K/uL (ref 1.5–6.5)
Abs Immature Granulocytes: 0.02 K/uL (ref 0.00–0.07)
Basophils Absolute: 0 K/uL (ref 0.0–0.1)
Basophils Relative: 1 %
Eosinophils Absolute: 0.2 K/uL (ref 0.0–0.5)
Eosinophils Relative: 3 %
HCT: 31.7 % — ABNORMAL LOW (ref 39.0–52.0)
Hemoglobin: 10.4 g/dL — ABNORMAL LOW (ref 13.0–17.0)
Immature Granulocytes: 0 %
Lymphocytes Relative: 16 %
Lymphs Abs: 0.9 K/uL (ref 0.7–4.0)
MCH: 28.7 pg (ref 26.0–34.0)
MCHC: 32.8 g/dL (ref 30.0–36.0)
MCV: 87.3 fL (ref 80.0–100.0)
Monocytes Absolute: 0.8 K/uL (ref 0.1–1.0)
Monocytes Relative: 15 %
Neutro Abs: 3.7 K/uL (ref 1.7–7.7)
Neutrophils Relative %: 65 %
Platelets: 63 K/uL — ABNORMAL LOW (ref 150–400)
RBC: 3.63 MIL/uL — ABNORMAL LOW (ref 4.22–5.81)
RDW: 15.6 % — ABNORMAL HIGH (ref 11.5–15.5)
WBC: 5.6 K/uL (ref 4.0–10.5)
nRBC: 0 % (ref 0.0–0.2)

## 2024-04-16 LAB — COMPREHENSIVE METABOLIC PANEL WITH GFR
ALT: 18 U/L (ref 0–44)
AST: 28 U/L (ref 15–41)
Albumin: 2.3 g/dL — ABNORMAL LOW (ref 3.5–5.0)
Alkaline Phosphatase: 59 U/L (ref 38–126)
Anion gap: 5 (ref 5–15)
BUN: 25 mg/dL — ABNORMAL HIGH (ref 8–23)
CO2: 25 mmol/L (ref 22–32)
Calcium: 9.9 mg/dL (ref 8.9–10.3)
Chloride: 103 mmol/L (ref 98–111)
Creatinine, Ser: 1.33 mg/dL — ABNORMAL HIGH (ref 0.61–1.24)
GFR, Estimated: 56 mL/min — ABNORMAL LOW (ref >60)
Glucose, Bld: 108 mg/dL — ABNORMAL HIGH (ref 70–99)
Potassium: 4.1 mmol/L (ref 3.5–5.1)
Sodium: 133 mmol/L — ABNORMAL LOW (ref 135–145)
Total Bilirubin: 1.4 mg/dL — ABNORMAL HIGH (ref 0.0–1.2)
Total Protein: 6.1 g/dL — ABNORMAL LOW (ref 6.5–8.1)

## 2024-04-16 LAB — PHOSPHORUS: Phosphorus: 2.4 mg/dL — ABNORMAL LOW (ref 2.5–4.6)

## 2024-04-16 LAB — MAGNESIUM: Magnesium: 1.8 mg/dL (ref 1.7–2.4)

## 2024-04-16 LAB — MRSA NEXT GEN BY PCR, NASAL: MRSA by PCR Next Gen: NOT DETECTED

## 2024-04-16 MED ORDER — CIPROFLOXACIN HCL 500 MG PO TABS
500.0000 mg | ORAL_TABLET | Freq: Two times a day (BID) | ORAL | 0 refills | Status: AC
  Filled 2024-04-16: qty 10, 5d supply, fill #0

## 2024-04-16 NOTE — NC FL2 (Signed)
 [place]  MEDICAID FL2 LEVEL OF CARE FORM     IDENTIFICATION  Patient Name: [name] Birthdate: 11-Feb-1951 Sex: male Admission Date (Current Location): 04/14/2024  [place] and [place] Number:  Chiropodist and Address:  [facility], [address] [place] [address] [place], [place] [address]      Provider Number: [id number]  Attending Physician Name and Address:  Leesa Kast, DO  Relative Name and Phone Number:       Current Level of Care: Hospital Recommended Level of Care: Family Care Home Prior Approval Number:    Date Approved/Denied:   PASRR Number:    Discharge Plan: Other (Comment) (Family Care Home)    Current Diagnoses: Patient Active Problem List   Diagnosis Date Noted   Bacteremia 02/18/2024   Complicated UTI (urinary tract infection) 02/17/2024   Chronic indwelling Foley catheter 01/03/2024   Hypomagnesemia 12/30/2023   Depression with anxiety 12/30/2023   Altered mental status 11/12/2023   Chronic kidney disease, stage 3a (HCC) 10/21/2023   Myocardial injury 10/21/2023   CKD stage 3a, GFR 45-59 ml/min (HCC) - baseline scr 1.3 10/01/2023   Hepatic encephalopathy (HCC) 09/30/2023   Liver cirrhosis (HCC) 09/30/2023   Essential hypertension 09/30/2023   Diabetes mellitus type 2, noninsulin dependent (HCC) 09/30/2023   CAD (coronary artery disease) 09/30/2023   Hyperlipidemia 09/30/2023   Hepatic encephalopathy (HCC) 08/01/2023   Hyperammonemia (HCC) 07/30/2023   Cervical spinal stenosis 07/01/2023   Cervical myelopathy (HCC) 06/21/2023   Cervical stenosis of spine 06/21/2023   Cervical radiculopathy 06/20/2023   E coli bacteremia 06/17/2023   UTI (urinary tract infection) 06/17/2023   Left leg cellulitis 03/23/2023   Leukocytosis 03/23/2023   Left leg pain 03/23/2023   Suicidal ideation 01/14/2023   Acute pain 01/14/2023   Adrenal insufficiency (HCC) 12/26/2022   Orthostatic hypotension 12/24/2022   Rib pain on right  side 12/24/2022   Hepatitis B infection 12/24/2022   Myocardial ischemia 12/24/2022   Acute appendicitis 11/05/2022   Pancytopenia (HCC) 11/05/2022   Sinus bradycardia 11/05/2022   Acute metabolic encephalopathy 09/22/2022   Liver lesion 09/22/2022   Obesity (BMI 30-39.9) 09/22/2022   Acute hepatic encephalopathy (HCC) 09/22/2022   MDD (major depressive disorder), recurrent severe, 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   Major depressive disorder, 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 psychotic features (HCC) 09/15/2022   Protein-calorie malnutrition, severe (HCC) 08/31/2022   Hypotension 08/31/2022   Weakness    Hx of transient ischemic attack (TIA) 04/28/2022   Obesity    Chronic diastolic CHF (congestive heart failure) (HCC)    Depression    Septic shock (HCC) 03/28/2022   Bacteremia due to Klebsiella pneumoniae 03/28/2022   Hypoglycemia 03/28/2022   Syncope 03/24/2022   Hypokalemia 03/24/2022   Fever 03/24/2022   COPD (chronic obstructive pulmonary disease) (HCC)    Elevated troponin    Abnormal LFTs    History of hepatitis C    AKI (acute kidney injury) (HCC)    Pure hypercholesterolemia    Obesity, Class III, BMI 40-49.9 (morbid obesity) 04/18/2020   Thrombocytopenia (HCC) 04/17/2020   Leukopenia 04/17/2020   HTN (hypertension) 04/17/2018   Uncontrolled type 2 diabetes mellitus with hyperglycemia, without long-term current use of insulin  (HCC) 04/17/2018   Lymphedema 04/17/2018   Normocytic anemia 04/07/2018   Chest pain 03/20/2018   Unsheltered homelessness 12/16/2017    Orientation RESPIRATION BLADDER Height & Weight     Self, Situation, Place  Normal  (suprapubic) Weight: 83.5 kg Height:  5' 6 (167.6 cm)  BEHAVIORAL SYMPTOMS/MOOD NEUROLOGICAL BOWEL NUTRITION STATUS      Continent Diet (regular)  AMBULATORY STATUS COMMUNICATION OF NEEDS Skin   Limited Assist Verbally Normal                       Personal Care Assistance Level of Assistance               Functional Limitations Info             SPECIAL CARE FACTORS FREQUENCY  PT (By licensed PT), OT (By licensed OT)     PT Frequency: [facility] OT Frequency: [facility]            Contractures Contractures Info: Not present    Additional Factors Info  Code Status Code Status Info: DNR             STOP taking these medications     Na Sulfate-K Sulfate-Mg Sulfate concentrate 17.5-3.13-1.6 GM/177ML Soln Commonly known as: SUPREP           TAKE these medications     acetaminophen  650 MG CR tablet Commonly known as: TYLENOL  Take 650 mg by mouth every 8 (eight) hours as needed for pain.    aspirin  81 MG chewable tablet Chew 81 mg by mouth daily.    atorvastatin  40 MG tablet Commonly known as: LIPITOR Take 40 mg by mouth daily.    ciprofloxacin  500 MG tablet Commonly known as: CIPRO  Take 1 tablet (500 mg total) by mouth 2 (two) times daily for 5 days.    citalopram  20 MG tablet Commonly known as: CELEXA  Take 20 mg by mouth daily.    feeding supplement Liqd Take 237 mLs by mouth 3 (three) times daily between meals.    furosemide  20 MG tablet Commonly known as: LASIX  Take 2 tablets (40 mg total) by mouth daily.    gabapentin  300 MG capsule Commonly known as: NEURONTIN  Take 300 mg by mouth 2 (two) times daily.    lactulose  10 GM/15ML solution Commonly known as: CHRONULAC  Take 67.5 mLs (45 g total) by mouth 3 (three) times daily. Please titrate to have 2-3 soft bowel movements daily    methocarbamol  500 MG tablet Commonly known as: ROBAXIN  Take 500 mg by mouth 4 (four) times daily.    multivitamin with minerals Tabs tablet Take 1 tablet by mouth daily.    nitroGLYCERIN  0.4 MG/SPRAY spray Commonly known as: NITROLINGUAL  Place 1 spray under the tongue every 5 (five) minutes x 3 doses as needed for chest pain.    nystatin cream Commonly known as: MYCOSTATIN Apply 1 Application topically 3 (three) times daily.    ondansetron  4 MG tablet Commonly  known as: ZOFRAN  Take 1 tablet (4 mg total) by mouth every 6 (six) hours as needed for nausea.    ondansetron  8 MG disintegrating tablet Commonly known as: ZOFRAN -ODT Take 8 mg by mouth every 6 (six) hours as needed for nausea.    pantoprazole  40 MG tablet Commonly known as: PROTONIX  Take 40 mg by mouth daily.    phenol 1.4 % Liqd Commonly known as: CHLORASEPTIC Use as directed 1 spray in the mouth or throat as needed for throat irritation / pain.    QUEtiapine  50 MG tablet Commonly known as: SEROQUEL  Take 50 mg by mouth at bedtime.    senna 8.6 MG Tabs tablet Commonly known as: SENOKOT Take 1 tablet (8.6 mg total) by mouth 2 (two) times daily as needed for mild constipation.    spironolactone  25  MG tablet Commonly known as: ALDACTONE  Take 1 tablet (25 mg total) by mouth daily.    Ventolin  HFA 108 (90 Base) MCG/ACT inhaler Generic drug: albuterol  Inhale 2 puffs into the lungs every 4 (four) hours as needed for wheezing.    albuterol  (2.5 MG/3ML) 0.083% nebulizer solution Commonly known as: PROVENTIL  Take 2.5 mg by nebulization every 4 (four) hours as needed for wheezing or shortness of breath.    Xifaxan  550 MG Tabs tablet Generic drug: rifaximin  Take 550 mg by mouth 2 (two) times daily.    Relevant Imaging Results:  Relevant Lab Results:   Additional Information SSN: [id number]  [name], RN

## 2024-04-16 NOTE — TOC Transition Note (Signed)
 Transition of Care [facility]) - Discharge Note   Patient Details  Name: [name] MRN: [id number] Date of Birth: 11-Dec-1950  [facility] CM/SW Contact:  [name], RN Phone Number: 04/16/2024, 4:12 PM   Clinical Narrative:     DC summary and fl2 faxed to [facility].  [name] to provide dc transport at 530 Bedside RN to call report to clement.  Patient agreeable to home health and states he doesn't have a preference of agency. Referral made to [facility] with [facility].  Meds to bed have been delivered   Final next level of care: Group Home Barriers to Discharge: Continued Medical Work up   Patient Goals and CMS Choice Patient states their goals for this hospitalization and ongoing recovery are:: Returning to group home          Discharge Placement                       Discharge Plan and Services Additional resources added to the After Visit Summary for                                       Social Drivers of Health (SDOH) Interventions SDOH Screenings   Food Insecurity: No Food Insecurity (04/14/2024)  Housing: Low Risk  (04/14/2024)  Transportation Needs: No Transportation Needs (04/14/2024)  Utilities: Not At Risk (04/14/2024)  Alcohol Screen: Low Risk  (09/24/2022)  Depression (PHQ2-9): High Risk (04/26/2022)  Financial Resource Strain: High Risk (04/27/2022)  Physical Activity: Sufficiently Active (04/16/2018)  Social Connections: Socially Isolated (04/14/2024)  Stress: Stress Concern Present (04/16/2018)  Tobacco Use: High Risk (04/14/2024)     Readmission Risk Interventions    11/29/2023    5:03 PM 11/13/2023    2:21 PM 11/12/2023    4:00 PM  Readmission Risk Prevention Plan  Transportation Screening Not Complete  Not Complete  Medication Review [employer]) Not Complete Complete Not Complete  PCP or Specialist appointment within 3-5 days of discharge Not Complete Complete Complete  HRI or Home Care Consult Not Complete  Complete   SW Recovery Care/Counseling Consult Not Complete Complete   Palliative Care Screening Not Applicable Complete   Skilled Nursing Facility Not Applicable Not Applicable

## 2024-04-16 NOTE — Discharge Summary (Signed)
 DISCHARGE SUMMARY    [name] FMW:[id number] DOB: 1951-04-16 DOA: 04/14/2024  PCP: [facility]  Admit date: 04/14/2024 Discharge date: 04/16/2024   Recommendations for Outpatient Follow-up:  Follow up with PCP in 1-2 weeks to chronic medication management Follow-up with urology as needed for [facility] care  Hospital Course: [name] is a 73 year old male with hepatitis C, liver cirrhosis, portal hypertension, COPD, hypertension, hyperlipidemia, diabetes, depression, history of TIA, frequent admissions for hepatic encephalopathy, recently discharged in June 2025 at that time for UTI and Klebsiella/Proteus bacteremia.  He had suprapubic catheter placed at [facility] on 04/13/2024.  He presents from his group home on this admission due to abdominal pain and generalized weakness.  On arrival to the ED heart rate 49, temperature 99.  Patient was acutely confused and urinalysis consistent with possible infection.  He was admitted for acute UTI and hepatic encephalopathy.  Initial urine culture was with polymicrobial growth.  Urine was recollected.  Based on prior urine cultures patient has demonstrated Klebsiella infections with pan sensitivity.  He will be discharged with an additional 5 days of ciprofloxacin .  By evaluation on 8/21 patient was afebrile, WBC WNL, and was at his mental status baseline.  He worked with physical therapy and demonstrated to be at his physical baseline as well.  He is returning to his group home today.   Acute UTI secondary to suprapubic catheter - SP cath placed 8/18 - Initial urine culture is polymicrobial, recollection pending -Prior urine culture shows Klebsiella with pan sensitivity.  Will discharge with Cipro . -Follow urine culture to completion and change antibiotics as needed outpatient  Hepatic encephalopathy - Ammonia 69 on arrival with flapping tremor - Continue lactulose  and rifaximin  - Mental status waxes and wanes - Alert and oriented  x 4 today - Appears to be at baseline   Cirrhosis Portal hypertension Chronic thrombocytopenia Anemia - Continue home meds   AKI on CKD stage IIIa - Baseline creatinine near 1.1, creatinine elevated to 1.63 on arrival - Improving now - Status post IV fluids - Encourage p.o. hydration now - Continue to avoid nephrotoxic medications when able - Trend CMP   Generalized weakness - Patient comes from group home.  PT/OT agree he is at his physical baseline.  Home health ordered at discharge   COPD - Not currently in exacerbation - As needed DuoNebs   History of TIA - Continue home meds  Discharge Instructions  Discharge Instructions     Call MD for:  difficulty breathing, headache or visual disturbances   Complete by: As directed    Call MD for:  persistant dizziness or light-headedness   Complete by: As directed    Call MD for:  persistant nausea and vomiting   Complete by: As directed    Call MD for:  severe uncontrolled pain   Complete by: As directed    Call MD for:  temperature >100.4   Complete by: As directed    Diet general   Complete by: As directed    Discharge instructions   Complete by: As directed    Follow up with your primary care physician to discuss the medication changes during this admission   Increase activity slowly   Complete by: As directed       Allergies as of 04/16/2024       Reactions   Bee Venom Anaphylaxis   Codeine  Itching   Only itching. Recently allergy tested at [facility]   Rsv Mrna Pre-f Virus Vaccine Swelling  Meloxicam    Other Reaction(s): ?overdose of local given at dentist--had trouble breathing   Codeine     Novocain [procaine]    Sulfa Antibiotics Other (See Comments)   Sulfa Antibiotics    hallucinations        Medication List     STOP taking these medications    Na Sulfate-K Sulfate-Mg Sulfate concentrate 17.5-3.13-1.6 GM/177ML Soln Commonly known as: SUPREP       TAKE these medications    acetaminophen   650 MG CR tablet Commonly known as: TYLENOL  Take 650 mg by mouth every 8 (eight) hours as needed for pain.   aspirin  81 MG chewable tablet Chew 81 mg by mouth daily.   atorvastatin  40 MG tablet Commonly known as: LIPITOR Take 40 mg by mouth daily.   ciprofloxacin  500 MG tablet Commonly known as: CIPRO  Take 1 tablet (500 mg total) by mouth 2 (two) times daily for 5 days.   citalopram  20 MG tablet Commonly known as: CELEXA  Take 20 mg by mouth daily.   feeding supplement Liqd Take 237 mLs by mouth 3 (three) times daily between meals.   furosemide  20 MG tablet Commonly known as: LASIX  Take 2 tablets (40 mg total) by mouth daily.   gabapentin  300 MG capsule Commonly known as: NEURONTIN  Take 300 mg by mouth 2 (two) times daily.   lactulose  10 GM/15ML solution Commonly known as: CHRONULAC  Take 67.5 mLs (45 g total) by mouth 3 (three) times daily. Please titrate to have 2-3 soft bowel movements daily   methocarbamol  500 MG tablet Commonly known as: ROBAXIN  Take 500 mg by mouth 4 (four) times daily.   multivitamin with minerals Tabs tablet Take 1 tablet by mouth daily.   nitroGLYCERIN  0.4 MG/SPRAY spray Commonly known as: NITROLINGUAL  Place 1 spray under the tongue every 5 (five) minutes x 3 doses as needed for chest pain.   nystatin cream Commonly known as: MYCOSTATIN Apply 1 Application topically 3 (three) times daily.   ondansetron  4 MG tablet Commonly known as: ZOFRAN  Take 1 tablet (4 mg total) by mouth every 6 (six) hours as needed for nausea.   ondansetron  8 MG disintegrating tablet Commonly known as: ZOFRAN -ODT Take 8 mg by mouth every 6 (six) hours as needed for nausea.   pantoprazole  40 MG tablet Commonly known as: PROTONIX  Take 40 mg by mouth daily.   phenol 1.4 % Liqd Commonly known as: CHLORASEPTIC Use as directed 1 spray in the mouth or throat as needed for throat irritation / pain.   QUEtiapine  50 MG tablet Commonly known as: SEROQUEL  Take 50 mg  by mouth at bedtime.   senna 8.6 MG Tabs tablet Commonly known as: SENOKOT Take 1 tablet (8.6 mg total) by mouth 2 (two) times daily as needed for mild constipation.   spironolactone  25 MG tablet Commonly known as: ALDACTONE  Take 1 tablet (25 mg total) by mouth daily.   Ventolin  HFA 108 (90 Base) MCG/ACT inhaler Generic drug: albuterol  Inhale 2 puffs into the lungs every 4 (four) hours as needed for wheezing.   albuterol  (2.5 MG/3ML) 0.083% nebulizer solution Commonly known as: PROVENTIL  Take 2.5 mg by nebulization every 4 (four) hours as needed for wheezing or shortness of breath.   Xifaxan  550 MG Tabs tablet Generic drug: rifaximin  Take 550 mg by mouth 2 (two) times daily.        Allergies  Allergen Reactions   Bee Venom Anaphylaxis   Codeine  Itching    Only itching. Recently allergy tested at [facility]  Rsv Mrna Pre-F Virus Vaccine Swelling   Meloxicam     Other Reaction(s): ?overdose of local given at dentist--had trouble breathing   Codeine     Novocain [Procaine]    Sulfa Antibiotics Other (See Comments)   Sulfa Antibiotics     hallucinations    Consultations:    Procedures/Studies: CT ABDOMEN PELVIS W CONTRAST Result Date: 04/14/2024 CLINICAL DATA:  Abdominal pain, acute, nonlocalized Abdominal pain since yesterday. Existing urinary catheter that was changed yesterday. EXAM: CT ABDOMEN AND PELVIS WITH CONTRAST TECHNIQUE: Multidetector CT imaging of the abdomen and pelvis was performed using the standard protocol following bolus administration of intravenous contrast. RADIATION DOSE REDUCTION: This exam was performed according to the departmental dose-optimization program which includes automated exposure control, adjustment of the mA and/or kV according to patient size and/or use of iterative reconstruction technique. CONTRAST:  OMNIPAQUE  IOHEXOL  300 MG/ML  SOLN COMPARISON:  Abdominopelvic CT 06/19/2023. FINDINGS: Lower chest: Interval resolution of bibasilar  atelectasis. The lung bases are now clear. There is aortic and coronary artery atherosclerosis and a small pericardial effusion. Hepatobiliary: Morphologic changes of advanced cirrhosis are again noted. No focal liver lesions are identified. There is chronic extrahepatic biliary dilatation status post cholecystectomy. The common hepatic duct measures up to 2.4 cm in diameter. No evidence of choledocholithiasis. Pancreas: Unremarkable. No pancreatic ductal dilatation or surrounding inflammatory changes. Spleen: Normal in size without focal abnormality. Adrenals/Urinary Tract: Both adrenal glands appear normal. Several nonobstructing right renal calculi are again noted. There is no evidence of ureteral calculus, hydronephrosis or perinephric soft tissue stranding. There is mild renal cortical scarring without suspicious focal renal lesion. Percutaneous suprapubic bladder catheter in place. The urinary bladder is decompressed with probable wall thickening and mild surrounding soft tissue stranding. Stomach/Bowel: No enteric contrast administered. The stomach appears unremarkable for its degree of distension. No evidence of bowel wall thickening, distention or surrounding inflammatory change. 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 A moderate amount of stool is present throughout the colon. Vascular/Lymphatic: There are no enlarged abdominal or pelvic lymph nodes. No acute vascular findings. Minimal aortic atherosclerosis. There is chronic dilatation of the portal vein and a large recanalized left paraumbilical vein with venous collaterals in the right lower quadrant. Reproductive: The prostate gland and seminal vesicles appear normal. Other: As above, enlarging left-sided spigelian hernia containing fat and small bowel. No signs of incarceration or obstruction. No evidence of ascites or pneumoperitoneum. Musculoskeletal: No acute or significant osseous findings. Mild  lumbar facet arthropathy. Previously demonstrated generalized subcutaneous edema has improved. IMPRESSION: 1. Percutaneous suprapubic bladder catheter in place with probable bladder wall thickening and surrounding soft tissue stranding. Correlate with urinalysis to exclude cystitis. 2. No other definite acute findings or explanation for the patient's symptoms. 3. 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 No evidence of incarceration or obstruction. 4. Nonobstructing right renal calculi. 5. Morphologic changes of advanced cirrhosis with findings of chronic portal hypertension. Stable chronic extrahepatic biliary dilatation status post cholecystectomy. No focal liver lesions identified. 6.  Aortic Atherosclerosis (ICD10-I70.0). Electronically Signed   By: Elsie Perone M.D.   On: 04/14/2024 10:10   IR CYSTOSTOMY TUBE PLACEMENT/BLADDER ASPIRATION Result Date: 04/13/2024 INDICATION: 73 year old male with Foley catheter dependence due to chronic urinary incontinence. He presents for placement of a suprapubic catheter. EXAM: Suprapubic catheter placement with ultrasound and fluoroscopic guidance COMPARISON:  None Available. MEDICATIONS: None ANESTHESIA/SEDATION: Moderate (conscious) sedation was employed during this procedure. A total of Versed   2 mg and Fentanyl  50 mcg was administered intravenously by the radiology nurse. Total intra-service moderate Sedation Time: 20 minutes. The patient's level of consciousness and vital signs were monitored continuously by radiology nursing throughout the procedure under my direct supervision. CONTRAST:  10 mL Omnipaque  300-administered into the collecting system(s) FLUOROSCOPY: Radiation Exposure Index (as provided by the fluoroscopic device): 27 mGy Kerma COMPLICATIONS: None immediate. PROCEDURE: Informed written consent was obtained from the patient after a thorough discussion of the procedural risks, benefits and alternatives. All questions were  addressed. Maximal Sterile Barrier Technique was utilized including caps, mask, sterile gowns, sterile gloves, sterile drape, hand hygiene and skin antiseptic. A timeout was performed prior to the initiation of the procedure. Ultrasound was used to interrogate the suprapubic region. The bladder is well distended. No evidence of intervening bowel. The median raphe between the lower rectus abdominus muscles was identified. Local anesthesia was attained by infiltration with 1% lidocaine . A small dermatotomy was made. Under real time ultrasound guidance, an 18 gauge trocar needle was advanced through the median raphe and into the bladder. A 0.035 wire was then coiled in the bladder. The needle was removed. An 8 x 80 athletics balloon was advanced coaxially through a 16 [place] tip Foley catheter. The balloon catheter assembly was advanced over the wire and the balloon positioned across the transabdominal tract and bladder wall. The balloon was then inflated to full effacement. As the balloon was deflated, the balloon catheter assembly was advanced as a unit over the wire pushing both the balloon and Foley catheter into the bladder. The Foley catheter retention balloon was inflated to 10 mL and pulled back against the anterior bladder wall. The athletics balloon was carefully deflated and removed over the wire. Contrast was injected through the Foley catheter confirming its placement within the bladder. Images were obtained and stored for the medical record. The patient tolerated the procedure well. IMPRESSION: Successful placement of a 16 [place] tip Foley catheter via suprapubic placement. Return Interventional Radiology in 6-8 weeks for first catheter exchange over a wire. Electronically Signed   By: Wilkie Lent M.D.   On: 04/13/2024 11:02      Discharge Exam: Vitals:   04/16/24 0432 04/16/24 0913  BP: 122/62 (!) 107/56  Pulse: (!) 51 (!) 52  Resp: 16 18  Temp: 99 F (37.2 C) 97.9 F  (36.6 C)  SpO2: 96% 98%   Vitals:   04/15/24 0925 04/15/24 2031 04/16/24 0432 04/16/24 0913  BP: 121/83 131/73 122/62 (!) 107/56  Pulse: 70 (!) 56 (!) 51 (!) 52  Resp: 18 16 16 18   Temp:  99.2 F (37.3 C) 99 F (37.2 C) 97.9 F (36.6 C)  TempSrc:      SpO2: 99% 98% 96% 98%  Weight:      Height:        Constitutional:  Normal appearance. Non toxic-appearing.  HENT: Head Normocephalic and atraumatic.  Mucous membranes are moist.  Eyes:  Extraocular intact. Conjunctivae normal.  Cardiovascular: Rate and Rhythm: Normal rate and regular rhythm.  Pulmonary: Non labored, symmetric rise of chest wall.  Skin: warm and dry. not jaundiced.  Neurological: No focal deficit present. alert. Oriented.  Psychiatric: Mood and Affect congruent.    The results of significant diagnostics from this hospitalization (including imaging, microbiology, ancillary and laboratory) are listed below for reference.     Microbiology: Recent Results (from the past 240 hours)  Urine Culture     Status: Abnormal   Collection  Time: 04/14/24  8:45 AM   Specimen: Urine, Random  Result Value Ref Range Status   Specimen Description   Final    URINE, RANDOM Performed at [facility], [address] [place], [place] [address]    Special Requests   Final    NONE Reflexed from [id number] Performed at [facility], [address] [place], [place] [address]    Culture MULTIPLE SPECIES PRESENT, SUGGEST RECOLLECTION (A)  Final   Report Status 04/15/2024 FINAL  Final  Urine Culture     Status: None (Preliminary result)   Collection Time: 04/15/24 12:15 PM   Specimen: Urine, Suprapubic  Result Value Ref Range Status   Specimen Description   Final    URINE, SUPRAPUBIC Performed at [facility], [address] [place], [place] [address]    Special Requests   Final    NONE Performed at [facility], [address], [place], [place] [address]    Culture   Final    CULTURE  REINCUBATED FOR BETTER GROWTH Performed at [facility], [address] [place] [address] [place], [place] [address]    Report Status PENDING  Incomplete  MRSA Next Gen by PCR, Nasal     Status: None   Collection Time: 04/15/24 11:30 PM   Specimen: Nasal Mucosa; Nasal Swab  Result Value Ref Range Status   MRSA by PCR Next Gen NOT DETECTED NOT DETECTED Final    Comment: (NOTE) The GeneXpert MRSA Assay (FDA approved for NASAL specimens only), is one component of a comprehensive MRSA colonization surveillance program. It is not intended to diagnose MRSA infection nor to guide or monitor treatment for MRSA infections. Test performance is not FDA approved in patients less than 18 years old. Performed at [facility] [place], [address] [place], [place] [address]      Labs: BNP (last 3 results) Recent Labs    08/22/23 1150 10/21/23 1842 12/30/23 1631  BNP 252.4* 250.6* 381.2*   Basic Metabolic Panel: Recent Labs  Lab 04/14/24 0845 04/15/24 0522 04/16/24 0512  NA 137 136 133*  K 3.7 4.0 4.1  CL 104 106 103  CO2 23 24 25   GLUCOSE 177* 109* 108*  BUN 24* 24* 25*  CREATININE 1.63* 1.36* 1.33*  CALCIUM  10.4* 10.0 9.9  MG  --   --  1.8  PHOS  --   --  2.4*   Liver Function Tests: Recent Labs  Lab 04/14/24 0845 04/16/24 0512  AST 40 28  ALT 20 18  ALKPHOS 68 59  BILITOT 2.1* 1.4*  PROT 7.7 6.1*  ALBUMIN  2.9* 2.3*   Recent Labs  Lab 04/14/24 0845  LIPASE 30   Recent Labs  Lab 04/14/24 0845  AMMONIA 69*   CBC: Recent Labs  Lab 04/13/24 0917 04/14/24 0845 04/15/24 0522 04/16/24 0512  WBC 3.8* 4.6 5.5 5.6  NEUTROABS  --  3.2  --  3.7  HGB 12.0* 12.1* 11.2* 10.4*  HCT 36.4* 36.6* 33.0* 31.7*  MCV 89.9 88.6 87.3 87.3  PLT 71* 75* 65* 63*   Cardiac Enzymes: No results for input(s): CKTOTAL, CKMB, CKMBINDEX, TROPONINI in the last 168 hours. BNP: Invalid input(s): POCBNP CBG: Recent Labs  Lab 04/13/24 0917 04/15/24 0931  GLUCAP 73 123*    D-Dimer No results for input(s): DDIMER in the last 72 hours. Hgb A1c No results for input(s): HGBA1C in the last 72 hours. Lipid Profile No results for input(s): CHOL, HDL, LDLCALC, TRIG, CHOLHDL, LDLDIRECT in the  last 72 hours. Thyroid  function studies No results for input(s): TSH, T4TOTAL, T3FREE, THYROIDAB in the last 72 hours.  Invalid input(s): FREET3 Anemia work up No results for input(s): VITAMINB12, FOLATE, FERRITIN, TIBC, IRON, RETICCTPCT in the last 72 hours. Urinalysis    Component Value Date/Time   COLORURINE YELLOW (A) 04/14/2024 0845   APPEARANCEUR CLEAR (A) 04/14/2024 0845   LABSPEC 1.018 04/14/2024 0845   PHURINE 6.0 04/14/2024 0845   GLUCOSEU NEGATIVE 04/14/2024 0845   HGBUR MODERATE (A) 04/14/2024 0845   BILIRUBINUR NEGATIVE 04/14/2024 0845   KETONESUR NEGATIVE 04/14/2024 0845   PROTEINUR NEGATIVE 04/14/2024 0845   NITRITE NEGATIVE 04/14/2024 0845   LEUKOCYTESUR SMALL (A) 04/14/2024 0845   Sepsis Labs Recent Labs  Lab 04/13/24 0917 04/14/24 0845 04/15/24 0522 04/16/24 0512  WBC 3.8* 4.6 5.5 5.6   Microbiology Recent Results (from the past 240 hours)  Urine Culture     Status: Abnormal   Collection Time: 04/14/24  8:45 AM   Specimen: Urine, Random  Result Value Ref Range Status   Specimen Description   Final    URINE, RANDOM Performed at [facility], [address], [place], [place] [address]    Special Requests   Final    NONE Reflexed from [id number] Performed at [facility], [address] [place], [place] [address]    Culture MULTIPLE SPECIES PRESENT, SUGGEST RECOLLECTION (A)  Final   Report Status 04/15/2024 FINAL  Final  Urine Culture     Status: None (Preliminary result)   Collection Time: 04/15/24 12:15 PM   Specimen: Urine, Suprapubic  Result Value Ref Range Status   Specimen Description   Final    URINE, SUPRAPUBIC Performed at [facility], [address] [place], [place] [address]    Special Requests   Final    NONE Performed at [facility], [address] [place], [place] [address]    Culture   Final    CULTURE REINCUBATED FOR BETTER GROWTH Performed at [facility], [address], [place], [place] [address]    Report Status PENDING  Incomplete  MRSA Next Gen by PCR, Nasal     Status: None   Collection Time: 04/15/24 11:30 PM   Specimen: Nasal Mucosa; Nasal Swab  Result Value Ref Range Status   MRSA by PCR Next Gen NOT DETECTED NOT DETECTED Final    Comment: (NOTE) The GeneXpert MRSA Assay (FDA approved for NASAL specimens only), is one component of a comprehensive MRSA colonization surveillance program. It is not intended to diagnose MRSA infection nor to guide or monitor treatment for MRSA infections. Test performance is not FDA approved in patients less than 8 years old. Performed at [facility], [address], [place], [place] [address]      Time coordinating discharge: 32 min   SIGNED: [name], DO [facility] 04/16/2024, 2:35 PM Pager   If 7PM-7AM, please contact night-coverage

## 2024-04-16 NOTE — Evaluation (Signed)
 Physical Therapy Evaluation Patient Details Name: [name] MRN: [id number] DOB: Sep 11, 1950 Today's Date: 04/16/2024  History of Present Illness  Pt is a 73 y/o M presenting to ED from group home with c/o abdominal pain and weakness. Pt had suprapubic catheter placed on 8/18. PMH significant for Hepatitis C, liver cirrhosis, portal HTN, HTN, COPD, HLD, DM, depression, hx of TIA.   Clinical Impression  Pt A&Ox3, disoriented to time, agreeable to PT evaluation. Pt reported pain in the R side of his head during session, no number given. At baseline, pt reports that he is a household amb with rollator at group home, has assistance from staff for some IADLs, history of frequent falls per chart. Pt completed 2 supine <> sit with supervision-CGA with assist for catheter bag management. Multiple STS <> EOB and BSC with RW and minA to stabilize RW. Pt with tendency to pull up on RW to stand despite extensive cues for hand placement on bed/BSC. Pt amb ~30 ft in room with RW, CGA, and intermittent minA to assist with positioning RW 2/2 pt's tendency to stand outside RW BOS and maneuver RW into objects in path. Pt is currently displaying deficits in functional mobility and balance and would benefit from skilled PT intervention to address listed deficits and allow for safe return to PLOF.         If plan is discharge home, recommend the following: A little help with walking and/or transfers;A little help with bathing/dressing/bathroom;Assistance with cooking/housework;Direct supervision/assist for medications management;Direct supervision/assist for financial management;Assist for transportation;Help with stairs or ramp for entrance;Supervision due to cognitive status   Can travel by private vehicle        Equipment Recommendations Rolling walker (2 wheels)  Recommendations for Other Services       Functional Status Assessment Patient has had a recent decline in their functional status and  demonstrates the ability to make significant improvements in function in a reasonable and predictable amount of time.     Precautions / Restrictions Precautions Precautions: Fall Restrictions Weight Bearing Restrictions Per Provider Order: No      Mobility  Bed Mobility Overal bed mobility: Needs Assistance Bed Mobility: Supine to Sit, Sit to Supine     Supine to sit: Supervision, HOB elevated Sit to supine: Contact guard assist   General bed mobility comments: Supine > sit HOB elevated with superivision, increased time and effort. Sit > supine with CGA- assist for catheter bag management    Transfers Overall transfer level: Needs assistance Equipment used: Rolling walker (2 wheels) Transfers: Sit to/from Stand Sit to Stand: Min assist           General transfer comment: 2 STS <> EOB, 2 STS <> BSC with RW and minA to stabilize RW, increased time and effort. Cues for hand placement, pt with difficulty following instructions and pulled on RW to stand    Ambulation/Gait Ambulation/Gait assistance: Contact guard assist, Min assist Gait Distance (Feet): 30 Feet Assistive device: Rolling walker (2 wheels) Gait Pattern/deviations: Step-through pattern, Trunk flexed, Wide base of support Gait velocity: decreased     General Gait Details: Pt had difficulty maneuvering RW, requiring intermittent minA to assist with RW positioning/prevent pt from pushing RW into objects  Stairs            Wheelchair Mobility     Tilt Bed    Modified Rankin (Stroke Patients Only)       Balance Overall balance assessment: Needs assistance Sitting-balance support: Feet supported Sitting balance-Leahy  Scale: Good     Standing balance support: Bilateral upper extremity supported, Reliant on assistive device for balance Standing balance-Leahy Scale: Fair                               Pertinent Vitals/Pain Pain Assessment Pain Assessment: Faces Faces Pain Scale:  Hurts little more Pain Location: head Pain Descriptors / Indicators: Grimacing Pain Intervention(s): Monitored during session, Repositioned    Home Living Family/patient expects to be discharged to:: Group home Living Arrangements: Group Home Available Help at Discharge: Personal care attendant Type of Home: Group Home Home Access: Ramped entrance       Home Layout: One level Home Equipment: Rollator (4 wheels)      Prior Function Prior Level of Function : Needs assist             Mobility Comments: Pt reports being IND with mobility with a rollator, hx of frequent falls ADLs Comments: Staff at group home assist with IADLs     Extremity/Trunk Assessment   Upper Extremity Assessment Upper Extremity Assessment: Generalized weakness    Lower Extremity Assessment Lower Extremity Assessment: Generalized weakness       Communication   Communication Communication: Impaired Factors Affecting Communication: Reduced clarity of speech    Cognition Arousal: Alert Behavior During Therapy: WFL for tasks assessed/performed, Impulsive   PT - Cognitive impairments: No family/caregiver present to determine baseline                       PT - Cognition Comments: A&Ox3 Following commands: Impaired Following commands impaired: Follows one step commands inconsistently     Cueing Cueing Techniques: Verbal cues, Visual cues     General Comments      Exercises Other Exercises Other Exercises: pt completed pericare with supervision, increased time and effort   Assessment/Plan    PT Assessment Patient needs continued PT services  PT Problem List Decreased strength;Decreased activity tolerance;Decreased balance;Decreased mobility;Decreased knowledge of use of DME;Decreased safety awareness       PT Treatment Interventions DME instruction;Gait training;Functional mobility training;Therapeutic activities;Therapeutic exercise;Balance training;Neuromuscular  re-education;Cognitive remediation;Patient/family education    PT Goals (Current goals can be found in the Care Plan section)  Acute Rehab PT Goals Patient Stated Goal: go back to group home PT Goal Formulation: With patient Time For Goal Achievement: 04/30/24 Potential to Achieve Goals: Good    Frequency Min 2X/week     Co-evaluation               AM-PAC PT 6 Clicks Mobility  Outcome Measure Help needed turning from your back to your side while in a flat bed without using bedrails?: None Help needed moving from lying on your back to sitting on the side of a flat bed without using bedrails?: None Help needed moving to and from a bed to a chair (including a wheelchair)?: A Little Help needed standing up from a chair using your arms (e.g., wheelchair or bedside chair)?: A Little Help needed to walk in hospital room?: A Little Help needed climbing 3-5 steps with a railing? : A Lot 6 Click Score: 19    End of Session Equipment Utilized During Treatment: Gait belt Activity Tolerance: Patient tolerated treatment well Patient left: in bed;with bed alarm set;with nursing/sitter in room Nurse Communication: Mobility status PT Visit Diagnosis: Unsteadiness on feet (R26.81);Other abnormalities of gait and mobility (R26.89);Muscle weakness (generalized) (M62.81);History of falling (Z91.81);Difficulty in  walking, not elsewhere classified (R26.2)    Time: 8599-8573 PT Time Calculation (min) (ACUTE ONLY): 26 min   Charges:     PT Treatments $Therapeutic Activity: 23-37 mins PT General Charges $$ ACUTE PT VISIT: 1 Visit         Yen Wandell, SPT

## 2024-04-16 NOTE — TOC Initial Note (Signed)
 Transition of Care [name]) - Initial/Assessment Note    Patient Details  Name: [name] MRN: [id number] Date of Birth: [birth year]  [facility] of Care [facility]) CM/SW Contact:    Asberry CHRISTELLA Jaksch, RN Phone Number: 04/16/2024, 1:18 PM  Clinical Narrative:     I spoke with patient at bedside. He confirms he is from [place] group home and wishes to return. [name] with [facility] contacted and confirmed patient is ok to return. [name] to provide transport at DC.   Expected Discharge Plan: Group Home ([facility]) Barriers to Discharge: Continued Medical Work up   Patient Goals and CMS Choice Patient states their goals for this hospitalization and ongoing recovery are:: Returning to group home          Expected Discharge Plan and Services         Expected Discharge Date: 04/13/24                                    Prior Living Arrangements/Services   Lives with:: Other (Comment) (From [facility] group home) Patient language and need for interpreter reviewed:: Yes Do you feel safe going back to the place where you live?: Yes        Care giver support system in place?: Yes (comment)      Activities of Daily Living   ADL Screening (condition at time of admission) Independently performs ADLs?: Yes (appropriate for developmental age) Is the patient deaf or have difficulty hearing?: No Does the patient have difficulty seeing, even when wearing glasses/contacts?: No Does the patient have difficulty concentrating, remembering, or making decisions?: No  Permission Sought/Granted                  Emotional Assessment Appearance:: Appears stated age     Orientation: : Oriented to Self, Oriented to Place, Oriented to Situation      Admission diagnosis:  Hepatic encephalopathy (HCC) [K76.82] Acute cystitis with hematuria [N30.01] Acute metabolic encephalopathy [G93.41] Altered mental status [R41.82] Patient Active Problem List   Diagnosis Date Noted    Bacteremia 02/18/2024   Complicated UTI (urinary tract infection) 02/17/2024   Chronic indwelling Foley catheter 01/03/2024   Hypomagnesemia 12/30/2023   Depression with anxiety 12/30/2023   Altered mental status 11/12/2023   Chronic kidney disease, stage 3a (HCC) 10/21/2023   Myocardial injury 10/21/2023   CKD stage 3a, GFR 45-59 ml/min (HCC) - baseline scr 1.3 10/01/2023   Hepatic encephalopathy (HCC) 09/30/2023   Liver cirrhosis (HCC) 09/30/2023   Essential hypertension 09/30/2023   Diabetes mellitus type 2, noninsulin dependent (HCC) 09/30/2023   CAD (coronary artery disease) 09/30/2023   Hyperlipidemia 09/30/2023   Hepatic encephalopathy (HCC) 08/01/2023   Hyperammonemia (HCC) 07/30/2023   Cervical spinal stenosis 07/01/2023   Cervical myelopathy (HCC) 06/21/2023   Cervical stenosis of spine 06/21/2023   Cervical radiculopathy 06/20/2023   E coli bacteremia 06/17/2023   UTI (urinary tract infection) 06/17/2023   Left leg cellulitis 03/23/2023   Leukocytosis 03/23/2023   Left leg pain 03/23/2023   Suicidal ideation 01/14/2023   Acute pain 01/14/2023   Adrenal insufficiency (HCC) 12/26/2022   Orthostatic hypotension 12/24/2022   Rib pain on right side 12/24/2022   Hepatitis B infection 12/24/2022   Myocardial ischemia 12/24/2022   Acute appendicitis 11/05/2022   Pancytopenia (HCC) 11/05/2022   Sinus bradycardia 11/05/2022   Acute metabolic encephalopathy 09/22/2022   Liver lesion 09/22/2022   Obesity (BMI 30-39.9)  09/22/2022   Acute hepatic encephalopathy (HCC) 09/22/2022   MDD (major depressive disorder), recurrent severe, 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   Major depressive disorder, 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 psychotic features (HCC) 09/15/2022   Protein-calorie malnutrition, severe (HCC) 08/31/2022   Hypotension 08/31/2022   Weakness    Hx of transient ischemic attack (TIA) 04/28/2022   Obesity    Chronic diastolic CHF (congestive heart failure) (HCC)     Depression    Septic shock (HCC) 03/28/2022   Bacteremia due to Klebsiella pneumoniae 03/28/2022   Hypoglycemia 03/28/2022   Syncope 03/24/2022   Hypokalemia 03/24/2022   Fever 03/24/2022   COPD (chronic obstructive pulmonary disease) (HCC)    Elevated troponin    Abnormal LFTs    History of hepatitis C    AKI (acute kidney injury) (HCC)    Pure hypercholesterolemia    Obesity, Class III, BMI 40-49.9 (morbid obesity) 04/18/2020   Thrombocytopenia (HCC) 04/17/2020   Leukopenia 04/17/2020   HTN (hypertension) 04/17/2018   Uncontrolled type 2 diabetes mellitus with hyperglycemia, without long-term current use of insulin  (HCC) 04/17/2018   Lymphedema 04/17/2018   Normocytic anemia 04/07/2018   Chest pain 03/20/2018   Unsheltered homelessness 12/16/2017   PCP:  [facility] Pharmacy:   [id number] [address] [place] - [address] [place]) [place] [address] Phone: [phone] Fax: [phone]  [facility] [address] [place] [address] Phone: [phone] Fax: [phone]  [facility] - [place], [place] - [place] [address] [place] [address] Phone: [phone] Fax: [phone]     Social Drivers of Health (SDOH) Social History: SDOH Screenings   Food Insecurity: No Food Insecurity (04/14/2024)  Housing: Low Risk  (04/14/2024)  Transportation Needs: No Transportation Needs (04/14/2024)  Utilities: Not At Risk (04/14/2024)  Alcohol Screen: Low Risk  (09/24/2022)  Depression (PHQ2-9): High Risk (04/26/2022)  Financial Resource Strain: High Risk (04/27/2022)  Physical Activity: Sufficiently Active (04/16/2018)  Social Connections: Socially Isolated (04/14/2024)  Stress: Stress Concern Present (04/16/2018)  Tobacco Use: High Risk (04/14/2024)   SDOH Interventions:     Readmission Risk Interventions     11/29/2023    5:03 PM 11/13/2023    2:21 PM 11/12/2023    4:00 PM  Readmission Risk Prevention Plan  Transportation Screening Not Complete  Not Complete  Medication Review [employer]) Not Complete Complete Not Complete  PCP or Specialist appointment within 3-5 days of discharge Not Complete Complete Complete  HRI or Home Care Consult Not Complete Complete   SW Recovery Care/Counseling Consult Not Complete Complete   Palliative Care Screening Not Applicable Complete   Skilled Nursing Facility Not Applicable Not Applicable

## 2024-04-16 NOTE — Care Management Important Message (Signed)
 Important Message  Patient Details  Name: [name] MRN: [id number] Date of Birth: 1950-08-30   Important Message Given:  Yes - Medicare IM     [name] 04/16/2024, 12:48 PM

## 2024-04-16 NOTE — Plan of Care (Signed)

## 2024-04-17 LAB — URINE CULTURE: Culture: >=100000 — AB

## 2024-05-01 ENCOUNTER — Ambulatory Visit
Admission: RE | Admit: 2024-05-01 | Discharge: 2024-05-01 | Disposition: A | Discharge status: Home / Self Care | Source: Ambulatory Visit | Attending: Internal Medicine | Admitting: Internal Medicine

## 2024-05-01 ENCOUNTER — Emergency Department
Admission: EM | Admit: 2024-05-01 | Discharge: 2024-05-01 | Attending: Emergency Medicine | Admitting: Emergency Medicine

## 2024-05-01 ENCOUNTER — Other Ambulatory Visit: Payer: Self-pay

## 2024-05-01 DIAGNOSIS — J449 Chronic obstructive pulmonary disease, unspecified: Secondary | ICD-10-CM | POA: Insufficient documentation

## 2024-05-01 DIAGNOSIS — K7460 Unspecified cirrhosis of liver: Secondary | ICD-10-CM | POA: Diagnosis present

## 2024-05-01 DIAGNOSIS — N3000 Acute cystitis without hematuria: Secondary | ICD-10-CM | POA: Diagnosis not present

## 2024-05-01 DIAGNOSIS — Z8673 Personal history of transient ischemic attack (TIA), and cerebral infarction without residual deficits: Secondary | ICD-10-CM | POA: Diagnosis not present

## 2024-05-01 DIAGNOSIS — D631 Anemia in chronic kidney disease: Secondary | ICD-10-CM | POA: Insufficient documentation

## 2024-05-01 DIAGNOSIS — N183 Chronic kidney disease, stage 3 unspecified: Secondary | ICD-10-CM | POA: Diagnosis not present

## 2024-05-01 DIAGNOSIS — I129 Hypertensive chronic kidney disease with stage 1 through stage 4 chronic kidney disease, or unspecified chronic kidney disease: Secondary | ICD-10-CM | POA: Insufficient documentation

## 2024-05-01 DIAGNOSIS — R188 Other ascites: Secondary | ICD-10-CM | POA: Diagnosis present

## 2024-05-01 DIAGNOSIS — E1122 Type 2 diabetes mellitus with diabetic chronic kidney disease: Secondary | ICD-10-CM | POA: Diagnosis not present

## 2024-05-01 DIAGNOSIS — M549 Dorsalgia, unspecified: Secondary | ICD-10-CM | POA: Diagnosis present

## 2024-05-01 LAB — CBC WITH DIFFERENTIAL/PLATELET
Abs Immature Granulocytes: 0.01 K/uL (ref 0.00–0.07)
Basophils Absolute: 0 K/uL (ref 0.0–0.1)
Basophils Relative: 1 %
Eosinophils Absolute: 0.1 K/uL (ref 0.0–0.5)
Eosinophils Relative: 3 %
HCT: 33 % — ABNORMAL LOW (ref 39.0–52.0)
Hemoglobin: 10.7 g/dL — ABNORMAL LOW (ref 13.0–17.0)
Immature Granulocytes: 0 %
Lymphocytes Relative: 23 %
Lymphs Abs: 0.8 K/uL (ref 0.7–4.0)
MCH: 29.2 pg (ref 26.0–34.0)
MCHC: 32.4 g/dL (ref 30.0–36.0)
MCV: 89.9 fL (ref 80.0–100.0)
Monocytes Absolute: 0.4 K/uL (ref 0.1–1.0)
Monocytes Relative: 11 %
Neutro Abs: 2.1 K/uL (ref 1.7–7.7)
Neutrophils Relative %: 62 %
Platelets: 111 K/uL — ABNORMAL LOW (ref 150–400)
RBC: 3.67 MIL/uL — ABNORMAL LOW (ref 4.22–5.81)
RDW: 16.3 % — ABNORMAL HIGH (ref 11.5–15.5)
WBC: 3.3 K/uL — ABNORMAL LOW (ref 4.0–10.5)
nRBC: 0 % (ref 0.0–0.2)

## 2024-05-01 LAB — COMPREHENSIVE METABOLIC PANEL WITH GFR
ALT: 35 U/L (ref 0–44)
AST: 65 U/L — ABNORMAL HIGH (ref 15–41)
Albumin: 2.6 g/dL — ABNORMAL LOW (ref 3.5–5.0)
Alkaline Phosphatase: 65 U/L (ref 38–126)
Anion gap: 6 (ref 5–15)
BUN: 35 mg/dL — ABNORMAL HIGH (ref 8–23)
CO2: 23 mmol/L (ref 22–32)
Calcium: 9.9 mg/dL (ref 8.9–10.3)
Chloride: 107 mmol/L (ref 98–111)
Creatinine, Ser: 1.62 mg/dL — ABNORMAL HIGH (ref 0.61–1.24)
GFR, Estimated: 44 mL/min — ABNORMAL LOW (ref >60)
Glucose, Bld: 116 mg/dL — ABNORMAL HIGH (ref 70–99)
Potassium: 3.6 mmol/L (ref 3.5–5.1)
Sodium: 136 mmol/L (ref 135–145)
Total Bilirubin: 1.5 mg/dL — ABNORMAL HIGH (ref 0.0–1.2)
Total Protein: 6.9 g/dL (ref 6.5–8.1)

## 2024-05-01 LAB — URINALYSIS, ROUTINE W REFLEX MICROSCOPIC
Bilirubin Urine: NEGATIVE
Glucose, UA: NEGATIVE mg/dL
Hgb urine dipstick: NEGATIVE
Ketones, ur: NEGATIVE mg/dL
Nitrite: NEGATIVE
Protein, ur: NEGATIVE mg/dL
Specific Gravity, Urine: 1.009 (ref 1.005–1.030)
Squamous Epithelial / HPF: 0 /HPF (ref 0–5)
pH: 5 (ref 5.0–8.0)

## 2024-05-01 LAB — AMMONIA: Ammonia: 77 umol/L — ABNORMAL HIGH (ref 9–35)

## 2024-05-01 MED ORDER — LINEZOLID 600 MG/300ML IV SOLN
600.0000 mg | Freq: Two times a day (BID) | INTRAVENOUS | Status: DC
  Administered 2024-05-01: 600 mg via INTRAVENOUS
  Filled 2024-05-01 (×2): qty 300

## 2024-05-01 MED ORDER — LINEZOLID 600 MG PO TABS
600.0000 mg | ORAL_TABLET | Freq: Two times a day (BID) | ORAL | 0 refills | Status: AC
Start: 2024-05-01 — End: 2024-05-11

## 2024-05-01 MED ORDER — LINEZOLID 600 MG PO TABS: 600.0000 mg | ORAL_TABLET | Freq: Two times a day (BID) | ORAL | 0 refills | Status: DC

## 2024-05-01 NOTE — Discharge Instructions (Signed)
 Please do not take the citalopram  while on the linezolid .  Keep all scheduled upcoming appointments.  Return to the ER for symptoms of concern including worsening pain or fever.

## 2024-05-01 NOTE — ED Notes (Signed)
 Report called to [facility] from [employer] who said he is in [employer] with another resident and will be here between 9-10 pm tonight. Heather charge RN aware.

## 2024-05-01 NOTE — ED Notes (Signed)
 Patient is alert and oriented. Resting on the stretcher. NAD.

## 2024-05-01 NOTE — ED Triage Notes (Signed)
 Arrives from Group home [facility]) via [employer].  C?O back pain.  Has history of chronic back pain.  HR:  47 98.3 171/70 98% RA  AAOx3.  Skin warm and dry. NAD

## 2024-05-01 NOTE — ED Provider Notes (Signed)
 [facility] Emergency Department Provider Note ____________________________________________  Time seen: Approximately 2:04 PM  I have reviewed the triage note.  HISTORY  Chief Complaint Back Pain   HPI [name] is a 73 y.o. male with history of hepatitis C, liver cirrhosis with hepatic encephalopathy, portal hypertension, COPD, hypertension, hyperlipidemia, diabetes, depression, TIA, suprapubic catheter on April 13, 2024 insertion presents to the ER for treatment and evaluation of right sided back pain.  Patient states that the pain comes and goes.  He denies nausea, vomiting, diarrhea, constipation, or abdominal pain.  PHYSICAL EXAM:  VITAL SIGNS: ED Triage Vitals [05/01/24 1208]  Encounter Vitals Group     BP (!) 129/58     Girls Systolic BP Percentile      Girls Diastolic BP Percentile      Boys Systolic BP Percentile      Boys Diastolic BP Percentile      Pulse Rate (!) 43     Resp 18     Temp 98.1 F (36.7 C)     Temp src      SpO2 99 %     Weight 202 lb (91.6 kg)     Height      Head Circumference      Peak Flow      Pain Score 8     Pain Loc      Pain Education      Exclude from Growth Chart     Constitutional: Alert and oriented. Well appearing and in no acute distress. Eyes: Conjunctivae are clear without discharge or drainage.  Head: Atraumatic. Neck: Full, active range of motion. Respiratory: Respirations even and unlabored. Musculoskeletal: Decreased ROM of the back and extremities, Strength 5/5 of the lower extremities as tested. Neurologic: Reflexes of the lower extremities are 2+. Negative straight leg raise on the right or left side.           Negative for chronic steroid use                      Negative for IV drug use.            Negative for trauma in the presence of osteoporosis           Negative for age over 44 and trauma.           Positive for constitutional symptoms, or history of cancer           Negative  for pain worse at night.                      Negative for burning, tingling, numb, electric, radiating pain in the extremities.                      Negative for saddle anesthesia.                      Negative for focal neurologic deficit, progressive or disabling symptoms           Negative for saddle anesthesia.                      Awake, alert, oriented to person and place. Skin: Atraumatic.  Psychiatric: Behavior and affect are normal.  ____________________________________________   LABS (all labs ordered are listed, but only abnormal results are displayed)  Labs Reviewed  URINALYSIS, ROUTINE W REFLEX MICROSCOPIC - Abnormal; Notable for  the following components:      Result Value   Color, Urine YELLOW (*)    APPearance HAZY (*)    Leukocytes,Ua MODERATE (*)    Bacteria, UA RARE (*)    All other components within normal limits  COMPREHENSIVE METABOLIC PANEL WITH GFR - Abnormal; Notable for the following components:   Glucose, Bld 116 (*)    BUN 35 (*)    Creatinine, Ser 1.62 (*)    Albumin  2.6 (*)    AST 65 (*)    Total Bilirubin 1.5 (*)    GFR, Estimated 44 (*)    All other components within normal limits  CBC WITH DIFFERENTIAL/PLATELET - Abnormal; Notable for the following components:   WBC 3.3 (*)    RBC 3.67 (*)    Hemoglobin 10.7 (*)    HCT 33.0 (*)    RDW 16.3 (*)    Platelets 111 (*)    All other components within normal limits  AMMONIA - Abnormal; Notable for the following components:   Ammonia 77 (*)    All other components within normal limits   ________________________________  RADIOLOGY  Not indicated.  I, Kirk Gentry, personally viewed and interpreted these images (plain radiographs) as part of my medical decision making, as well as reviewing the written report by the radiologist ____________________________________________   PROCEDURES  Procedure(s) performed:  Procedures  Critical Care performed:  No  ____________________________________________   INITIAL IMPRESSION / ASSESSMENT AND PLAN / ED COURSE  Patient's presentation is most consistent with acute presentation with potential threat to life or bodily function.  Differential diagnosis includes, but is not limited to: Pyelonephritis, acute cystitis, hepatic encephalopathy, acute on chronic back pain   [name] is a 73 y.o. male with complex past medical history recently discharged after diagnosis of acute UTI secondary to suprapubic catheter, hepatic encephalopathy, AKI on CKD stage III.  He was sent to the emergency department today from his group home with complaints of back pain.  Per EMS report, the pain had been present for the past 7 months but today he had increased difficulty standing due to pain.  Patient reports that this pain is different from his chronic back pain and states that it comes and goes.  Upon review of most recent discharge summary, patient was discharged home on ciprofloxacin  after suprapubic catheter specimen showed Klebsiella and Proteus.  There was a urine culture that resulted on 8/21 that showed Enterococcus faecium resistant to vancomycin  but sensitive to linezolid  and nitrofurantoin. I am unable to find documentation indicating this was treated.   Lab studies today show white blood cell count of 3.3, stable anemia with a hemoglobin of 10.7 and hematocrit of 33.0, platelet count of 111.  CMP shows a BUN of 35 with a creatinine of 1.62 with a GFR 44.  Ammonia level is 77.  Urinalysis shows moderate leukocytes with 11-20 white blood cells and rare bacteria with white blood cell clumps and mucus present.  Vital signs reviewed.  Blood pressure, SpO2, respiratory rate, and temperature are normal.  Heart rate is 47 which is patient's baseline.  I do not see a beta-blocker on his list of home medications that would cause bradycardia.  Pharmacist consulted regarding urine culture results from 04/16/2024.  She  recommended treating with linezolid  but holding the citalopram  while on the medication for 10 days.  IV dose ordered while here.  I considered  admission, however the patient has not failed outpatient antibiotic treatment for this UTI and  labs are overall at or near patient's baseline.  Case discussed with ED attending, Dr. Willo.  Plan will be to discuss admission versus discharge with the patient.  Patient feels that he would prefer to go home and take the antibiotics instead of staying in the hospital.  This seems reasonable as he has no indication of severe infection/sepsis and we have identified appropriate treatment for current UTI.  Medications  linezolid  (ZYVOX ) IVPB 600 mg (600 mg Intravenous New Bag/Given 05/01/24 1641)     FINAL CLINICAL IMPRESSION(S) / ED DIAGNOSES   Final diagnoses:  Acute cystitis without hematuria     If controlled substance prescribed during this visit, 12 month history viewed on the [facility] prior to issuing an initial prescription for Schedule II or III opiod.    Herlinda Kirk NOVAK, FNP 05/01/24 1752    Willo Dunnings, MD 05/01/24 [phone]

## 2024-05-01 NOTE — ED Triage Notes (Signed)
 Pt complains of back pain x 7 months and today difficulty getting up due to pain

## 2024-05-01 NOTE — ED Notes (Signed)
 Patient was assisted from wheelchair to stretcher with a two-person assist. Patient could stand and pivot. Patient has existing Foley Catheter with amber urine. Patient denies any pain associated with the catheter.

## 2024-05-01 NOTE — ED Notes (Signed)
 Patient ambulated in the room with a walker. Patient needed assistance transferring from stretcher to standing, but had a steady gait with the walker. KYM Gentry, NP at bedside.

## 2024-05-01 NOTE — ED Notes (Addendum)
 Patient is dozing on and off, wakes easily to answer questions. Zyvox  infusing without difficulty. IV site WNL.

## 2024-05-01 NOTE — ED Notes (Signed)
 PT in no acute distress prior to discharge. Discharged instructions reviewed and pt caregiver stated that they understand directions. Pt has all belongings with them at time of discharge. Pt assisted to wheelchair out to car. Pt assisted with minimal assist into car with caregiver, [name] Later.

## 2024-05-02 ENCOUNTER — Emergency Department
Admission: EM | Admit: 2024-05-02 | Discharge: 2024-05-02 | Disposition: A | Discharge status: Home / Self Care | Attending: Emergency Medicine | Admitting: Emergency Medicine

## 2024-05-02 ENCOUNTER — Encounter: Payer: Self-pay | Admitting: Emergency Medicine

## 2024-05-02 ENCOUNTER — Other Ambulatory Visit: Payer: Self-pay

## 2024-05-02 DIAGNOSIS — Y732 Prosthetic and other implants, materials and accessory gastroenterology and urology devices associated with adverse incidents: Secondary | ICD-10-CM | POA: Insufficient documentation

## 2024-05-02 DIAGNOSIS — T83091A Other mechanical complication of indwelling urethral catheter, initial encounter: Secondary | ICD-10-CM | POA: Diagnosis present

## 2024-05-02 DIAGNOSIS — Z978 Presence of other specified devices: Secondary | ICD-10-CM

## 2024-05-02 DIAGNOSIS — J449 Chronic obstructive pulmonary disease, unspecified: Secondary | ICD-10-CM | POA: Diagnosis not present

## 2024-05-02 DIAGNOSIS — T83020A Displacement of cystostomy catheter, initial encounter: Secondary | ICD-10-CM

## 2024-05-02 MED ORDER — LIDOCAINE HCL URETHRAL/MUCOSAL 2 % EX GEL
1.0000 | Freq: Once | CUTANEOUS | Status: DC
  Filled 2024-05-02: qty 6

## 2024-05-02 NOTE — ED Notes (Signed)
 This tech changed patient's soiled brief.

## 2024-05-02 NOTE — ED Notes (Signed)
 Report given to [place] from facility

## 2024-05-02 NOTE — Discharge Instructions (Addendum)
 We were unable to replace your suprapubic catheter in the ER.  We have placed a Foley catheter instead.  Have included information for follow-up with IR for consideration of replacement of your suprapubic catheter.  Of also included information for follow-up with urology.  Return to the ER for new or worsening symptoms.

## 2024-05-02 NOTE — ED Triage Notes (Signed)
 Pt in via [facility] from [facility]; complaints of urinary catheter displacement since this am.  Patient is unable to tell me why he has urinary catheter; states is was replaced yesterday and this morning it dislodged sporadically.  Denies any pain.  A/Ox3; this is patients baseline per EMS.  NAD noted at this time.    Hx Sinus Bradycardia.

## 2024-05-02 NOTE — ED Provider Notes (Signed)
 [facility] Provider Note    Event Date/Time   First MD Initiated Contact with Patient 05/02/24 1147     (approximate)   History   Urinary Catheter Problem   HPI  [name] is a 73 year old male with history of hepatitis C, liver cirrhosis, COPD, recent catheter associated UTI presenting to the emergency department for evaluation of dislodged catheter.  Patient reports that a few hours prior to presentation he felt a popping sensation and noticed that his catheter was dislodged.  Denies pain or trauma.  Unable to clearly tell me why he has a suprapubic catheter.  Reviewed discharge summary from 04/16/2024.  At that time patient presented with weakness found to have UTI, hepatic encephalopathy.  He was discharged on Cipro .  He presented back to the ER yesterday with right-sided back pain.  It appears the suprapubic catheter was first placed by IR on 04/13/2024.     Physical Exam   Triage Vital Signs: ED Triage Vitals  Encounter Vitals Group     BP 05/02/24 1150 125/60     Girls Systolic BP Percentile --      Girls Diastolic BP Percentile --      Boys Systolic BP Percentile --      Boys Diastolic BP Percentile --      Pulse Rate 05/02/24 1150 (!) 45     Resp 05/02/24 1150 18     Temp 05/02/24 1150 98.9 F (37.2 C)     Temp Source 05/02/24 1150 Oral     SpO2 05/02/24 1150 96 %     Weight 05/02/24 1154 208 lb (94.3 kg)     Height 05/02/24 1154 5' 6 (1.676 m)     Head Circumference --      Peak Flow --      Pain Score 05/02/24 1154 0     Pain Loc --      Pain Education --      Exclude from Growth Chart --     Most recent vital signs: Vitals:   05/02/24 1150 05/02/24 1459  BP: 125/60 115/60  Pulse: (!) 45   Resp: 18   Temp: 98.9 F (37.2 C) 98.1 F (36.7 C)  SpO2: 96% 98%     General: Awake, interactive  CV:  Regular rate, good peripheral perfusion.  Resp:  Unlabored respirations.  Abd:  Nondistended, soft, nontender, 16 French  suprapubic catheter dislodged, sitting over stomach with visible tract without active bleeding or drainage Neuro:  Symmetric facial movement, fluid speech   ED Results / Procedures / Treatments   Labs (all labs ordered are listed, but only abnormal results are displayed) Labs Reviewed - No data to display   EKG EKG independently reviewed and interpreted by myself demonstrates:    RADIOLOGY Imaging independently reviewed and interpreted by myself demonstrates:   Formal Radiology Read:  No results found.  PROCEDURES:  Critical Care performed: No  SUPRAPUBIC TUBE PLACEMENT  Date/Time: 05/02/2024 1:58 PM  Performed by: Levander Slate, MD Authorized by: Levander Slate, MD   Consent:    Consent obtained:  Verbal   Consent given by:  Patient   Risks, benefits, and alternatives were discussed: yes   Universal protocol:    Patient identity confirmed:  Verbally with patient Sedation:    Sedation type:  None Anesthesia:    Anesthesia method:  None Procedure details:    Complexity:  Simple   Catheter type:  Foley   Catheter size:  16 Fr  Ultrasound guidance: no     Number of attempts:  1 Post-procedure details:    Procedure completion:  Procedure terminated electively by provider Comments:     Resistance with attempt to place SPC. With recent tract creation and based on discussion with urology, further attempts to place [facility] discontinued, will plan for penile Foley catheter placement.     MEDICATIONS ORDERED IN ED: Medications - No data to display   IMPRESSION / MDM / ASSESSMENT AND PLAN / ED COURSE  I reviewed the triage vital signs and the nursing notes.  Differential diagnosis includes, but is not limited to, dislodged suprapubic catheter, no evidence of trauma, no reported systemic symptoms  Patient's presentation is most consistent with acute, uncomplicated illness.  73 year old male presenting with dislodged suprapubic catheter.  Bradycardic but consistent with prior  values on presentation, no cardiac symptoms.  Reviewed records, fairly recently placed catheter.  In the setting of this, case was discussed with Dr. Carolee with urology.  He notes that an attempt to place suprapubic catheter can be made, but would likely be difficult given fairly recent tract formation.  If resistance was encountered, recommended discontinuing attempts at suprapubic catheter placement and instead placing a penile Foley catheter and having the patient follow-up with IR for [facility] replacement.   Did encounter resistance with attempts as SPC.  Given this, a traditional Foley catheter was placed.  Of note, while patient was without catheter in place he was able to void spontaneously.  However, on review of his urology note from 8/1, void trial was discussed and patient wished to continue with his Foley catheter.  Will defer further discussions with urology team.  Also given information to follow-up with IR team who did his initial [facility] placement.  Patient without other complaints currently.  Do think he is stable for discharge.  Strict return precautions provided.  Patient discharged in stable condition.     FINAL CLINICAL IMPRESSION(S) / ED DIAGNOSES   Final diagnoses:  Migration of suprapubic catheter (HCC)  Status post insertion of Foley catheter     Rx / DC Orders   ED Discharge Orders     None        Note:  This document was prepared using Dragon voice recognition software and may include unintentional dictation errors.   Levander Slate, MD 05/02/24 [phone]

## 2024-05-15 ENCOUNTER — Other Ambulatory Visit: Payer: Self-pay

## 2024-05-15 ENCOUNTER — Emergency Department

## 2024-05-15 ENCOUNTER — Emergency Department
Admission: EM | Admit: 2024-05-15 | Discharge: 2024-05-16 | Disposition: A | Discharge status: Home / Self Care | Source: Ambulatory Visit | Attending: Emergency Medicine | Admitting: Emergency Medicine

## 2024-05-15 DIAGNOSIS — E7220 Disorder of urea cycle metabolism, unspecified: Secondary | ICD-10-CM | POA: Insufficient documentation

## 2024-05-15 DIAGNOSIS — N189 Chronic kidney disease, unspecified: Secondary | ICD-10-CM | POA: Diagnosis not present

## 2024-05-15 DIAGNOSIS — R42 Dizziness and giddiness: Secondary | ICD-10-CM | POA: Diagnosis present

## 2024-05-15 DIAGNOSIS — R531 Weakness: Secondary | ICD-10-CM | POA: Diagnosis not present

## 2024-05-15 DIAGNOSIS — R410 Disorientation, unspecified: Secondary | ICD-10-CM | POA: Diagnosis not present

## 2024-05-15 LAB — COMPREHENSIVE METABOLIC PANEL WITH GFR
ALT: 44 U/L (ref 0–44)
AST: 69 U/L — ABNORMAL HIGH (ref 15–41)
Albumin: 2.8 g/dL — ABNORMAL LOW (ref 3.5–5.0)
Alkaline Phosphatase: 77 U/L (ref 38–126)
Anion gap: 10 (ref 5–15)
BUN: 31 mg/dL — ABNORMAL HIGH (ref 8–23)
CO2: 25 mmol/L (ref 22–32)
Calcium: 10 mg/dL (ref 8.9–10.3)
Chloride: 105 mmol/L (ref 98–111)
Creatinine, Ser: 1.71 mg/dL — ABNORMAL HIGH (ref 0.61–1.24)
GFR, Estimated: 41 mL/min — ABNORMAL LOW (ref >60)
Glucose, Bld: 89 mg/dL (ref 70–99)
Potassium: 3.9 mmol/L (ref 3.5–5.1)
Sodium: 140 mmol/L (ref 135–145)
Total Bilirubin: 1.9 mg/dL — ABNORMAL HIGH (ref 0.0–1.2)
Total Protein: 7.5 g/dL (ref 6.5–8.1)

## 2024-05-15 LAB — CBC
HCT: 38.2 % — ABNORMAL LOW (ref 39.0–52.0)
Hemoglobin: 12.5 g/dL — ABNORMAL LOW (ref 13.0–17.0)
MCH: 29.1 pg (ref 26.0–34.0)
MCHC: 32.7 g/dL (ref 30.0–36.0)
MCV: 89 fL (ref 80.0–100.0)
Platelets: 90 K/uL — ABNORMAL LOW (ref 150–400)
RBC: 4.29 MIL/uL (ref 4.22–5.81)
RDW: 17 % — ABNORMAL HIGH (ref 11.5–15.5)
WBC: 3.9 K/uL — ABNORMAL LOW (ref 4.0–10.5)
nRBC: 0 % (ref 0.0–0.2)

## 2024-05-15 LAB — BLOOD GAS, VENOUS
Acid-Base Excess: 2 mmol/L (ref 0.0–2.0)
Bicarbonate: 26.5 mmol/L (ref 20.0–28.0)
O2 Saturation: 82.8 %
Patient temperature: 37
pCO2, Ven: 40 mmHg — ABNORMAL LOW (ref 44–60)
pH, Ven: 7.43 (ref 7.25–7.43)
pO2, Ven: 51 mmHg — ABNORMAL HIGH (ref 32–45)

## 2024-05-15 LAB — URINALYSIS, ROUTINE W REFLEX MICROSCOPIC
Bilirubin Urine: NEGATIVE
Glucose, UA: NEGATIVE mg/dL
Ketones, ur: NEGATIVE mg/dL
Nitrite: NEGATIVE
Protein, ur: NEGATIVE mg/dL
Specific Gravity, Urine: 1.018 (ref 1.005–1.030)
Squamous Epithelial / HPF: 0 /HPF (ref 0–5)
pH: 5 (ref 5.0–8.0)

## 2024-05-15 NOTE — ED Triage Notes (Signed)
 Pt comes with c/o weakness. Pt keeps answering ok. Pt has foley in place. Pt appears confused and unable to answer some questions. Pt states pain and weakness all over.

## 2024-05-15 NOTE — ED Provider Notes (Incomplete)
 [facility] Provider Note    Event Date/Time   First MD Initiated Contact with Patient 05/15/24 2256     (approximate)   History   Weakness   HPI [name] is a 73 y.o. male well-known to the emergency department with numerous visits for a variety of complaints including unhoused status, relatively recently becoming a resident at [facility] family care home.  The patient presents tonight saying that he has been confused and lightheaded.  He has had some generalized weakness.  He said he feels better now and has gone through 2 cans of soda and is tolerating oral intake without difficulty.  He has no other specific complaints or concerns.  He does not remember having a fall or any other issue.  He denies chest pain, shortness of breath, nausea, vomiting, and abdominal pain.  He denies any numbness or weakness in any specific extremity.  He has a Foley catheter.     Physical Exam   Triage Vital Signs: ED Triage Vitals  Encounter Vitals Group     BP 05/15/24 1817 137/69     Girls Systolic BP Percentile --      Girls Diastolic BP Percentile --      Boys Systolic BP Percentile --      Boys Diastolic BP Percentile --      Pulse Rate 05/15/24 1817 (!) 50     Resp 05/15/24 1817 16     Temp 05/15/24 1817 99.6 F (37.6 C)     Temp Source 05/15/24 1817 Oral     SpO2 05/15/24 1817 100 %     Weight 05/15/24 1818 93 kg (205 lb)     Height 05/15/24 1818 1.676 m (5' 6)     Head Circumference --      Peak Flow --      Pain Score 05/15/24 1822 0     Pain Loc --      Pain Education --      Exclude from Growth Chart --     Most recent vital signs: Vitals:   05/15/24 1817 05/15/24 2233  BP: 137/69 (!) 153/84  Pulse: (!) 50 (!) 50  Resp: 16 18  Temp: 99.6 F (37.6 C) 98.5 F (36.9 C)  SpO2: 100% 100%    General: Disheveled but at his baseline.  Conversant, pleasant, no specific complaints. CV:  Good peripheral perfusion.  Bradycardia, normal heart  sounds. Resp:  Normal effort. Speaking easily and comfortably, no accessory muscle usage nor intercostal retractions.  Lungs clear to auscultation. Abd:  Soft with no distention.  No tenderness to palpation throughout the abdomen.   Other:  Patient has no appreciable neurological deficits with good grip strength bilaterally and major muscle groups strength that is normal and equal in arms and legs.  No dysarthria nor aphasia.  Foley catheter is in place and draining well.   ED Results / Procedures / Treatments   Labs (all labs ordered are listed, but only abnormal results are displayed) Labs Reviewed  COMPREHENSIVE METABOLIC PANEL WITH GFR - Abnormal; Notable for the following components:      Result Value   BUN 31 (*)    Creatinine, Ser 1.71 (*)    Albumin  2.8 (*)    AST 69 (*)    Total Bilirubin 1.9 (*)    GFR, Estimated 41 (*)    All other components within normal limits  CBC - Abnormal; Notable for the following components:   WBC 3.9 (*)  Hemoglobin 12.5 (*)    HCT 38.2 (*)    RDW 17.0 (*)    Platelets 90 (*)    All other components within normal limits  URINALYSIS, ROUTINE W REFLEX MICROSCOPIC - Abnormal; Notable for the following components:   Color, Urine AMBER (*)    APPearance HAZY (*)    Hgb urine dipstick MODERATE (*)    Leukocytes,Ua TRACE (*)    Bacteria, UA RARE (*)    All other components within normal limits  BLOOD GAS, VENOUS - Abnormal; Notable for the following components:   pCO2, Ven 40 (*)    pO2, Ven 51 (*)    All other components within normal limits  URINE CULTURE  ETHANOL  AMMONIA  CBG MONITORING, ED     EKG  ED ECG REPORT I, Darleene Dome, the attending physician, personally viewed and interpreted this ECG.  Date: 05/15/2024 EKG Time: 18: 29 Rate: 48 Rhythm: Sinus bradycardia QRS Axis: normal Intervals: normal ST/T Wave abnormalities: Non-specific ST segment / T-wave changes, but no clear evidence of acute ischemia. Narrative  Interpretation: no definitive evidence of acute ischemia; does not meet STEMI criteria.    RADIOLOGY I independently viewed and interpreted the patient's head CT and I see no evidence of acute intracranial hemorrhage nor neoplasm.   PROCEDURES:  Critical Care performed: No  Procedures    IMPRESSION / MDM / ASSESSMENT AND PLAN / ED COURSE  I reviewed the triage vital signs and the nursing notes.                              Differential diagnosis includes, but is not limited to, hypercapnia, hyperammonemia, intoxication, UTI, metabolic or electrolyte abnormality,  Patient's presentation is most consistent with acute presentation with potential threat to life or bodily function.  Labs/studies ordered: ***  Interventions/Medications given:  Medications - No data to display *** (Note:  hospital course my include additional interventions and/or labs/studies not listed above.)   ***  {**The patient is on the cardiac monitor to evaluate for evidence of arrhythmia and/or significant heart rate changes.**}   Clinical Course as of 05/15/24 2359  Fri May 15, 2024  2348 pCO2, Ven(!): 40 No evidence of  [CF]    Clinical Course User Index [CF] Dome Darleene, MD     FINAL CLINICAL IMPRESSION(S) / ED DIAGNOSES   Final diagnoses:  None     Rx / DC Orders   ED Discharge Orders     None        Note:  This document was prepared using Dragon voice recognition software and may include unintentional dictation errors.

## 2024-05-15 NOTE — ED Provider Notes (Signed)
 [facility] Provider Note    Event Date/Time   First MD Initiated Contact with Patient 05/15/24 2256     (approximate)   History   Weakness   HPI [name] is a 73 y.o. male well-known to the emergency department with numerous visits for a variety of complaints including unhoused status, relatively recently becoming a resident at [facility] family care home.  The patient presents tonight saying that he has been confused and lightheaded.  He has had some generalized weakness.  He said he feels better now and has gone through 2 cans of soda and is tolerating oral intake without difficulty.  He has no other specific complaints or concerns.  He does not remember having a fall or any other issue.  He denies chest pain, shortness of breath, nausea, vomiting, and abdominal pain.  He denies any numbness or weakness in any specific extremity.  He has a Foley catheter.     Physical Exam   Triage Vital Signs: ED Triage Vitals  Encounter Vitals Group     BP 05/15/24 1817 137/69     Girls Systolic BP Percentile --      Girls Diastolic BP Percentile --      Boys Systolic BP Percentile --      Boys Diastolic BP Percentile --      Pulse Rate 05/15/24 1817 (!) 50     Resp 05/15/24 1817 16     Temp 05/15/24 1817 99.6 F (37.6 C)     Temp Source 05/15/24 1817 Oral     SpO2 05/15/24 1817 100 %     Weight 05/15/24 1818 93 kg (205 lb)     Height 05/15/24 1818 1.676 m (5' 6)     Head Circumference --      Peak Flow --      Pain Score 05/15/24 1822 0     Pain Loc --      Pain Education --      Exclude from Growth Chart --     Most recent vital signs: Vitals:   05/16/24 0000 05/16/24 0100  BP: (!) 160/72 (!) 160/72  Pulse: (!) 44 (!) 47  Resp:  15  Temp:    SpO2: 99% 100%    General: Disheveled but at his baseline.  Conversant, pleasant, no specific complaints. CV:  Good peripheral perfusion.  Bradycardia, normal heart sounds. Resp:  Normal effort.  Speaking easily and comfortably, no accessory muscle usage nor intercostal retractions.  Lungs clear to auscultation. Abd:  Soft with no distention.  No tenderness to palpation throughout the abdomen.   Other:  Patient has no appreciable neurological deficits with good grip strength bilaterally and major muscle groups strength that is normal and equal in arms and legs.  No dysarthria nor aphasia.  Foley catheter is in place and draining well.   ED Results / Procedures / Treatments   Labs (all labs ordered are listed, but only abnormal results are displayed) Labs Reviewed  COMPREHENSIVE METABOLIC PANEL WITH GFR - Abnormal; Notable for the following components:      Result Value   BUN 31 (*)    Creatinine, Ser 1.71 (*)    Albumin  2.8 (*)    AST 69 (*)    Total Bilirubin 1.9 (*)    GFR, Estimated 41 (*)    All other components within normal limits  CBC - Abnormal; Notable for the following components:   WBC 3.9 (*)    Hemoglobin 12.5 (*)  HCT 38.2 (*)    RDW 17.0 (*)    Platelets 90 (*)    All other components within normal limits  URINALYSIS, ROUTINE W REFLEX MICROSCOPIC - Abnormal; Notable for the following components:   Color, Urine AMBER (*)    APPearance HAZY (*)    Hgb urine dipstick MODERATE (*)    Leukocytes,Ua TRACE (*)    Bacteria, UA RARE (*)    All other components within normal limits  AMMONIA - Abnormal; Notable for the following components:   Ammonia 91 (*)    All other components within normal limits  BLOOD GAS, VENOUS - Abnormal; Notable for the following components:   pCO2, Ven 40 (*)    pO2, Ven 51 (*)    All other components within normal limits  URINE CULTURE  ETHANOL  CBG MONITORING, ED     EKG  ED ECG REPORT I, Darleene Dome, the attending physician, personally viewed and interpreted this ECG.  Date: 05/15/2024 EKG Time: 18: 29 Rate: 48 Rhythm: Sinus bradycardia QRS Axis: normal Intervals: normal ST/T Wave abnormalities: Non-specific ST  segment / T-wave changes, but no clear evidence of acute ischemia. Narrative Interpretation: no definitive evidence of acute ischemia; does not meet STEMI criteria.    RADIOLOGY I independently viewed and interpreted the patient's head CT and I see no evidence of acute intracranial hemorrhage nor neoplasm.   PROCEDURES:  Critical Care performed: No  Procedures    IMPRESSION / MDM / ASSESSMENT AND PLAN / ED COURSE  I reviewed the triage vital signs and the nursing notes.                              Differential diagnosis includes, but is not limited to, hypercapnia, hyperammonemia, intoxication, UTI, metabolic or electrolyte abnormality,  Patient's presentation is most consistent with acute presentation with potential threat to life or bodily function.  Labs/studies ordered: Ethanol level, ammonia level, urinalysis, urine culture, VBG, CMP, urinalysis, CBC, CT head, EKG  Interventions/Medications given:  Medications  lactulose  (CHRONULAC ) 10 GM/15ML solution 30 g (has no administration in time range)    (Note:  hospital course my include additional interventions and/or labs/studies not listed above.)   Patient's physical exam is reassuring.  He is bradycardic but it is a sinus bradycardia rather than a heart block and he is in no distress and asymptomatic from the bradycardia.  Blood pressure is appropriate.  CT head is nonacute.  Creatinine is very slightly elevated above baseline but he has chronic kidney disease at baseline and a creatinine of 1.7 is just barely above what it was couple weeks ago.  His VBG is pending and I ordered it to make sure he is not hypercapnic but he is not having any respiratory issues.  Ethanol is normal.  Ammonia scheduled.  Urinalysis has some hematuria but he has an indwelling Foley and there is no evidence of infection.  I am sending a urine culture but will not treat empirically.  Once we verify no hyperammonemia, I have reassured that there  is no evidence of an acute or emergent condition and he should be appropriate for discharge and outpatient follow-up     Clinical Course as of 05/16/24 0202  Fri May 15, 2024  2348 pCO2, Ven(!): 40 No evidence of  [CF]  Sat May 16, 2024  0158 Ammonia(!): 91 Although the patient's ammonia level is elevated at 91, he is consistently in the 70s in  the past.  I reviewed his medical record and he is already prescribed lactulose .  I will give him an extra dose of lactulose  tonight, but given that he seems to have returned to his baseline, is fully conversant, and is most consistent with grade 0 or grade 1 hepatic encephalopathy at the most, he should not require hospitalization and continue with outpatient management and outpatient follow-up. [CF]    Clinical Course User Index [CF] Gordan Huxley, MD     FINAL CLINICAL IMPRESSION(S) / ED DIAGNOSES   Final diagnoses:  Generalized weakness  Hyperammonemia (HCC)     Rx / DC Orders   ED Discharge Orders          Ordered    lactulose  (CHRONULAC ) 10 GM/15ML solution  3 times daily        05/16/24 0202             Note:  This document was prepared using Dragon voice recognition software and may include unintentional dictation errors.   Gordan Huxley, MD 05/16/24 [phone]

## 2024-05-15 NOTE — ED Triage Notes (Signed)
 First nurse note: pt to ED [facility] from [name] for generalized weakness. Hx CHF

## 2024-05-15 NOTE — ED Notes (Signed)
Lab called to come and get blood from pt.

## 2024-05-16 DIAGNOSIS — E7220 Disorder of urea cycle metabolism, unspecified: Secondary | ICD-10-CM | POA: Diagnosis not present

## 2024-05-16 LAB — ETHANOL: Alcohol, Ethyl (B): <15 mg/dL (ref <15)

## 2024-05-16 LAB — AMMONIA: Ammonia: 91 umol/L — ABNORMAL HIGH (ref 9–35)

## 2024-05-16 MED ORDER — LACTULOSE 10 GM/15ML PO SOLN: 45.0000 g | Freq: Three times a day (TID) | ORAL | 10 refills | Status: DC

## 2024-05-16 MED ORDER — LACTULOSE 10 GM/15ML PO SOLN
30.0000 g | Freq: Once | ORAL | Status: AC
  Administered 2024-05-16: 30 g via ORAL
  Filled 2024-05-16: qty 60

## 2024-05-16 NOTE — Discharge Instructions (Signed)
 Your workup in the Emergency Department today was reassuring.  We did not find any specific abnormalities other than your ammonia level being a little bit higher than usual.  You need to be sure and continue taking your lactulose  as prescribed.  We sent a new prescription to the [facility] pharmacy just in case you need additional refills.  We also gave you an extra dose in the emergency department.  We recommend you drink plenty of fluids, take your regular medications and/or any new ones prescribed today, and follow up with the doctor(s) listed in these documents as recommended.  Return to the Emergency Department if you develop new or worsening symptoms that concern you.

## 2024-05-16 NOTE — ED Notes (Signed)
 Assisted RN with moving pt up in the bed.

## 2024-05-17 ENCOUNTER — Emergency Department
Admission: EM | Admit: 2024-05-17 | Discharge: 2024-05-17 | Disposition: A | Discharge status: Home / Self Care | Attending: Emergency Medicine | Admitting: Emergency Medicine

## 2024-05-17 ENCOUNTER — Other Ambulatory Visit: Payer: Self-pay

## 2024-05-17 DIAGNOSIS — Y732 Prosthetic and other implants, materials and accessory gastroenterology and urology devices associated with adverse incidents: Secondary | ICD-10-CM | POA: Diagnosis not present

## 2024-05-17 DIAGNOSIS — T83098A Other mechanical complication of other indwelling urethral catheter, initial encounter: Secondary | ICD-10-CM | POA: Diagnosis present

## 2024-05-17 DIAGNOSIS — T839XXA Unspecified complication of genitourinary prosthetic device, implant and graft, initial encounter: Secondary | ICD-10-CM

## 2024-05-17 NOTE — ED Notes (Signed)
 Standard foley drainage bag removed and leg bag applied to this patient.

## 2024-05-17 NOTE — ED Notes (Signed)
 Pts friend [name] Later here to transport pt home. Pt escorted out in wheelchair.

## 2024-05-17 NOTE — ED Triage Notes (Signed)
 Chief Complaint  Patient presents with   Foley Catheter Problem   Past Medical History:  Diagnosis Date   (HFpEF) heart failure with preserved ejection fraction (HCC)    a.) TTE 03/20/2018: EF 60-65%, no RWMAs, mild LAE, mild-mod MR, PASP 42; b.) TTE 03/31/2021: EF 55-60%, no RWMAs, mild LVH, mild LAE, AoV sclerosis without stenosis; c.) TTE 03/25/2022: EF 60-65%, no RWMAs, AoV sclerosis without stenosis; d.) TTE 12/24/2022: EF 60-65%, no RWMAs, mild LVH, mild LAE.   Anasarca    Anemia    Anxiety    Aortic atherosclerosis (HCC)    Arthritis    Asthma    Bacteremia due to Escherichia coli 05/2023   Bacteremia due to Klebsiella pneumoniae 2023   Bilateral carotid artery disease (HCC)    Bradycardia    CAD (coronary artery disease) 10/21/2006   a.) MV 10/21/2006: small reversible defect involving the apical  lateral wall c/w possible ischemia; b.) MV: 03/24/2018: no ischemia; c.) MV 10/04/2021: LAD calcifications   CAD (coronary artery disease)    Cervical radiculopathy    Chest pain    a.) MV 03/21/2018: no ischemia; b.) MV 10/04/2021: no ischemia   Chronic back pain    Chronic common bile duct dilatation    Cirrhosis (HCC)    a.) (+) hepatic lesion on CT; likely hepatocellular carcinoma; b.) CT evidence of associated portal hypertension; c.) (+) abdominopelvic anasarca   COPD (chronic obstructive pulmonary disease) (HCC)    DDD (degenerative disc disease), lumbar    Depression    Frequent falls    GERD (gastroesophageal reflux disease)    HBV (hepatitis B virus) infection    HCV (hepatitis C virus)    Hernia of anterior abdominal wall    History of 2019 novel coronavirus disease (COVID-19) 04/16/2020   Homelessness    a.) limited on placement as patient is listed on [place] sex offender listing for exploitation of a minor (possession of child pornography); incarcerated 12 years (released 11/2017)   Hyperlipidemia    Hypertension    Infestation by bed bug 02/2022   Malingering     Migraine    Nephrolithiasis    Obesity    Portal hypertension (HCC)    PTSD (post-traumatic stress disorder)    a.) brief training in the [employer] at age 74; someone was killed in front of him   Pulmonary HTN (HCC)    a.) multiple CT scans desmontrating dilated PA c/w pHTN; b.) TTE 03/20/2018: PASP 42 mmHg   Sleep apnea    a.) no nocturnal PAP therapy   Sleep apnea    Stenosis of cervical spine with myelopathy (HCC)    Suicidal ideations    T2DM (type 2 diabetes mellitus) (HCC)    Thrombocytopenia (HCC)    a.) related to hepatic cirrhosis; spleen normal in size   TIA (transient ischemic attack)    TIA (transient ischemic attack) 09/30/2023    BP (!) 157/77   Pulse (!) 48   Temp 98.5 F (36.9 C) (Oral)   Resp 16   Ht 5' 6 (1.676 m)   Wt 93.2 kg   SpO2 100%   BMI 33.17 kg/m

## 2024-05-17 NOTE — ED Provider Notes (Signed)
   [facility] Provider Note    Event Date/Time   First MD Initiated Contact with Patient 05/17/24 1915     (approximate)   History   Foley Catheter Problem   HPI  [name] is a 73 y.o. male with chronic indwelling Foley catheter is here because he has a leak from his Foley bag.  He is requesting a leg bag.  He reports otherwise the Foley is working well, no fevers discomfort or abdominal pain     Physical Exam   Triage Vital Signs: ED Triage Vitals  Encounter Vitals Group     BP 05/17/24 1922 (!) 157/77     Girls Systolic BP Percentile --      Girls Diastolic BP Percentile --      Boys Systolic BP Percentile --      Boys Diastolic BP Percentile --      Pulse Rate 05/17/24 1922 (!) 48     Resp 05/17/24 1922 16     Temp 05/17/24 1922 98.5 F (36.9 C)     Temp Source 05/17/24 1922 Oral     SpO2 05/17/24 1921 99 %     Weight 05/17/24 1923 93.2 kg (205 lb 8 oz)     Height 05/17/24 1923 1.676 m (5' 6)     Head Circumference --      Peak Flow --      Pain Score 05/17/24 1921 0     Pain Loc --      Pain Education --      Exclude from Growth Chart --     Most recent vital signs: Vitals:   05/17/24 2000 05/17/24 2015  BP: (!) 135/59   Pulse: (!) 47 (!) 47  Resp:    Temp:    SpO2: 100% 100%     General: Awake, no distress.  CV:  Good peripheral perfusion.  Resp:  Normal effort.  Abd:  No distention.  Other:  Foley bag with yellow urine, small leak noted   ED Results / Procedures / Treatments   Labs (all labs ordered are listed, but only abnormal results are displayed) Labs Reviewed - No data to display   EKG     RADIOLOGY     PROCEDURES:  Critical Care performed:   Procedures   MEDICATIONS ORDERED IN ED: Medications - No data to display   IMPRESSION / MDM / ASSESSMENT AND PLAN / ED COURSE  I reviewed the triage vital signs and the nursing notes. Patient's presentation is most consistent with acute,  uncomplicated illness.  We will provide leg bag, no indication for new Foley at this time.        FINAL CLINICAL IMPRESSION(S) / ED DIAGNOSES   Final diagnoses:  Problem with Foley catheter, initial encounter [facility])     Rx / DC Orders   ED Discharge Orders     None        Note:  This document was prepared using Dragon voice recognition software and may include unintentional dictation errors.   Arlander Charleston, MD 05/17/24 [phone]

## 2024-05-17 NOTE — ED Notes (Signed)
 [name] Later contacted and will transport pt back home.

## 2024-05-19 LAB — URINE CULTURE: Culture: >=100000 — AB

## 2024-06-10 ENCOUNTER — Other Ambulatory Visit: Payer: Self-pay

## 2024-06-10 ENCOUNTER — Emergency Department
Admission: EM | Admit: 2024-06-10 | Discharge: 2024-06-10 | Disposition: A | Discharge status: Home / Self Care | Attending: Emergency Medicine | Admitting: Emergency Medicine

## 2024-06-10 DIAGNOSIS — B3749 Other urogenital candidiasis: Secondary | ICD-10-CM | POA: Diagnosis not present

## 2024-06-10 DIAGNOSIS — T83091A Other mechanical complication of indwelling urethral catheter, initial encounter: Secondary | ICD-10-CM | POA: Insufficient documentation

## 2024-06-10 DIAGNOSIS — I5030 Unspecified diastolic (congestive) heart failure: Secondary | ICD-10-CM | POA: Diagnosis not present

## 2024-06-10 DIAGNOSIS — I2510 Atherosclerotic heart disease of native coronary artery without angina pectoris: Secondary | ICD-10-CM | POA: Diagnosis not present

## 2024-06-10 DIAGNOSIS — I110 Hypertensive heart disease with heart failure: Secondary | ICD-10-CM | POA: Diagnosis not present

## 2024-06-10 DIAGNOSIS — Y732 Prosthetic and other implants, materials and accessory gastroenterology and urology devices associated with adverse incidents: Secondary | ICD-10-CM | POA: Insufficient documentation

## 2024-06-10 DIAGNOSIS — K7460 Unspecified cirrhosis of liver: Secondary | ICD-10-CM | POA: Insufficient documentation

## 2024-06-10 DIAGNOSIS — Z8616 Personal history of COVID-19: Secondary | ICD-10-CM | POA: Insufficient documentation

## 2024-06-10 DIAGNOSIS — J4489 Other specified chronic obstructive pulmonary disease: Secondary | ICD-10-CM | POA: Insufficient documentation

## 2024-06-10 DIAGNOSIS — E119 Type 2 diabetes mellitus without complications: Secondary | ICD-10-CM | POA: Diagnosis not present

## 2024-06-10 DIAGNOSIS — N368 Other specified disorders of urethra: Secondary | ICD-10-CM

## 2024-06-10 DIAGNOSIS — T839XXA Unspecified complication of genitourinary prosthetic device, implant and graft, initial encounter: Secondary | ICD-10-CM

## 2024-06-10 MED ORDER — NYSTATIN 100000 UNIT/GM EX POWD: 1.0000 | Freq: Three times a day (TID) | CUTANEOUS | 0 refills | Status: DC

## 2024-06-10 NOTE — ED Notes (Signed)
  Contact at Group home, [name] RAMAN., [phone].

## 2024-06-10 NOTE — ED Triage Notes (Signed)
 Pt arrives via EMS from a group home, [facility] home. He reports recently having his foley catheter changed a few days ago. He states over the past couple of days, he has been having pain in his penis and testicles. Pt is AxOx4.

## 2024-06-10 NOTE — ED Notes (Signed)
 RN attempted to call group home. No answer. RN left message to call back to ED.

## 2024-06-10 NOTE — ED Provider Notes (Signed)
 [facility] Provider Note    Event Date/Time   First MD Initiated Contact with Patient 06/10/24 [phone]     (approximate)   History   Chief Complaint: Foley Catheter Problem and Penis Pain   HPI  [name] is a 73 y.o. male with a history of cirrhosis, heart failure, diabetes who was sent to the ED from his group home due to pain at the end of the penis.  No recent trauma.  Is a chronic Foley.  Reports eating and drinking normally, taking his medications.  Reports he took his medicine this morning.  Denies any other complaints.        Past Medical History:  Diagnosis Date   (HFpEF) heart failure with preserved ejection fraction (HCC)    a.) TTE 03/20/2018: EF 60-65%, no RWMAs, mild LAE, mild-mod MR, PASP 42; b.) TTE 03/31/2021: EF 55-60%, no RWMAs, mild LVH, mild LAE, AoV sclerosis without stenosis; c.) TTE 03/25/2022: EF 60-65%, no RWMAs, AoV sclerosis without stenosis; d.) TTE 12/24/2022: EF 60-65%, no RWMAs, mild LVH, mild LAE.   Anasarca    Anemia    Anxiety    Aortic atherosclerosis    Arthritis    Asthma    Bacteremia due to Escherichia coli 05/2023   Bacteremia due to Klebsiella pneumoniae 2023   Bilateral carotid artery disease    Bradycardia    CAD (coronary artery disease) 10/21/2006   a.) MV 10/21/2006: small reversible defect involving the apical  lateral wall c/w possible ischemia; b.) MV: 03/24/2018: no ischemia; c.) MV 10/04/2021: LAD calcifications   CAD (coronary artery disease)    Cervical radiculopathy    Chest pain    a.) MV 03/21/2018: no ischemia; b.) MV 10/04/2021: no ischemia   Chronic back pain    Chronic common bile duct dilatation    Cirrhosis (HCC)    a.) (+) hepatic lesion on CT; likely hepatocellular carcinoma; b.) CT evidence of associated portal hypertension; c.) (+) abdominopelvic anasarca   COPD (chronic obstructive pulmonary disease) (HCC)    DDD (degenerative disc disease), lumbar    Depression     Frequent falls    GERD (gastroesophageal reflux disease)    HBV (hepatitis B virus) infection    HCV (hepatitis C virus)    Hernia of anterior abdominal wall    History of 2019 novel coronavirus disease (COVID-19) 04/16/2020   Homelessness    a.) limited on placement as patient is listed on [place] sex offender listing for exploitation of a minor (possession of child pornography); incarcerated 12 years (released 11/2017)   Hyperlipidemia    Hypertension    Infestation by bed bug 02/2022   Malingering    Migraine    Nephrolithiasis    Obesity    Portal hypertension (HCC)    PTSD (post-traumatic stress disorder)    a.) brief training in [employer] at age 67; someone was killed in front of him   Pulmonary HTN (HCC)    a.) multiple CT scans desmontrating dilated PA c/w pHTN; b.) TTE 03/20/2018: PASP 42 mmHg   Sleep apnea    a.) no nocturnal PAP therapy   Sleep apnea    Stenosis of cervical spine with myelopathy (HCC)    Suicidal ideations    T2DM (type 2 diabetes mellitus) (HCC)    Thrombocytopenia    a.) related to hepatic cirrhosis; spleen normal in size   TIA (transient ischemic attack)    TIA (transient ischemic attack) 09/30/2023  Current Outpatient Rx   Order #: [id number] Class: Normal   Order #: [id number] Class: Historical Med   Order #: [id number] Class: Historical Med   Order #: [id number] Class: Historical Med   Order #: [id number] Class: Historical Med   Order #: [id number] Class: Historical Med   Order #: [id number] Class: Normal   Order #: [id number] Class: Normal   Order #: [id number] Class: Historical Med   Order #: [id number] Class: Normal   Order #: [id number] Class: Historical Med   Order #: [id number] Class: Historical Med   Order #: [id number] Class: Normal   Order #: [id number] Class: Normal   Order #: [id number] Class: Historical Med   Order #: [id number] Class: Historical Med   Order #: [id number] Class: Historical Med   Order #: [id number] Class: Historical Med   Order #:  [id number] Class: Normal   Order #: [id number] Class: Normal   Order #: [id number] Class: Historical Med   Order #: [id number] Class: Historical Med    Past Surgical History:  Procedure Laterality Date   ANTERIOR CERVICAL DECOMP/DISCECTOMY FUSION N/A 07/01/2023   Procedure: ANTERIOR CERVICAL DISCECTOMY AND FUSION (FORGE);  Surgeon: Clois Fret, MD;  Location: [facility] ORS;  Service: Neurosurgery;  Laterality: N/A;  Keep as last case of the day per Dr Clois   ANTERIOR CERVICAL DISCECTOMY     APPENDECTOMY     CARDIAC CATHETERIZATION     CHOLECYSTECTOMY     COLONOSCOPY N/A 04/01/2024   Procedure: COLONOSCOPY;  Surgeon: Toledo, Ladell POUR, MD;  Location: [facility] END[facility];  Service: Gastroenterology;  Laterality: N/A;   ESOPHAGOGASTRODUODENOSCOPY N/A 04/01/2024   Procedure: EGD (ESOPHAGOGASTRODUODENOSCOPY);  Surgeon: Toledo, Ladell POUR, MD;  Location: [facility] ENDOS[facility];  Service: Gastroenterology;  Laterality: N/A;   EYE SURGERY     INNER EAR SURGERY     IR CYSTOSTOMY TUBE PLACEMENT/BLADDER ASPIRATION  04/13/2024   NOSE SURGERY     POLYPECTOMY  04/01/2024   Procedure: POLYPECTOMY, INTESTINE;  Surgeon: Aundria, Ladell POUR, MD;  Location: [facility];  Service: Gastroenterology;;   XI ROBOTIC LAPAROSCOPIC ASSISTED APPENDECTOMY N/A 11/05/2022   Procedure: XI ROBOTIC LAPAROSCOPIC ASSISTED APPENDECTOMY;  Surgeon: Rodolph Romano, MD;  Location: [facility] ORS;  Service: General;  Laterality: N/A;    Physical Exam   Triage Vital Signs: ED Triage Vitals  Encounter Vitals Group     BP 06/10/24 0811 (!) 139/52     Girls Systolic BP Percentile --      Girls Diastolic BP Percentile --      Boys Systolic BP Percentile --      Boys Diastolic BP Percentile --      Pulse Rate 06/10/24 0812 (!) 52     Resp 06/10/24 0811 17     Temp 06/10/24 0811 99.3 F (37.4 C)     Temp src --      SpO2 06/10/24 0811 100 %     Weight --      Height --      Head Circumference --      Peak Flow --      Pain Score 06/10/24  0811 10     Pain Loc --      Pain Education --      Exclude from Growth Chart --     Most recent vital signs: Vitals:   06/10/24 0811 06/10/24 0812  BP: (!) 139/52   Pulse:  (!) 52  Resp: 17   Temp: 99.3 F (37.4 C)   SpO2: 100%     General: Awake, no distress.  CV:  Good peripheral perfusion.  Resp:  Normal effort.  Abd:  No distention.  Soft nontender Other:  Genital exam reveals mild candidiasis.  Foley catheter present, tubing is clean, urine clear.  There is some dilatation and tearing of the urethral meatus from chronic Foley pressure.  Does not have a StatLock in place to hold the tubing.   ED Results / Procedures / Treatments   Labs (all labs ordered are listed, but only abnormal results are displayed) Labs Reviewed - No data to display   EKG    RADIOLOGY    PROCEDURES:  Procedures   MEDICATIONS ORDERED IN ED: Medications - No data to display   IMPRESSION / MDM / ASSESSMENT AND PLAN / ED COURSE  I reviewed the triage vital signs and the nursing notes.  Patient's presentation is most consistent with acute illness / injury with system symptoms.  Patient complains of penile pain.  Appears to be due to a urethral tear from chronic Foley use.  He also has candidiasis, will start nystatin powder.  Abdomen is benign, vital signs reassuring, stable for discharge.       FINAL CLINICAL IMPRESSION(S) / ED DIAGNOSES   Final diagnoses:  Pain in urethral meatus  Problem with Foley catheter, initial encounter  Candidiasis of genitalia  Cirrhosis of liver without ascites, unspecified hepatic cirrhosis type (HCC)     Rx / DC Orders   ED Discharge Orders          Ordered    nystatin (MYCOSTATIN/NYSTOP) powder  3 times daily        06/10/24 9171             Note:  This document was prepared using Dragon voice recognition software and may include unintentional dictation errors.   Viviann Pastor, MD 06/10/24 [phone]

## 2024-06-11 ENCOUNTER — Ambulatory Visit (INDEPENDENT_AMBULATORY_CARE_PROVIDER_SITE_OTHER): Admitting: Urology

## 2024-06-11 ENCOUNTER — Encounter: Payer: Self-pay | Admitting: Urology

## 2024-06-11 VITALS — BP 95/65 | HR 46 | Temp 98.0°F | Ht 66.0 in | Wt 204.4 lb

## 2024-06-11 DIAGNOSIS — N4889 Other specified disorders of penis: Secondary | ICD-10-CM | POA: Diagnosis not present

## 2024-06-11 DIAGNOSIS — R339 Retention of urine, unspecified: Secondary | ICD-10-CM | POA: Diagnosis not present

## 2024-06-11 MED ORDER — BENZOCAINE 10 % MT GEL: OROMUCOSAL | 0 refills | Status: AC

## 2024-06-11 NOTE — Progress Notes (Signed)
 06/11/2024 11:59 AM   [name] 05-04-1951 [id number]  Referring provider: [name] [address] [place],  [place] [address]  Urological history: 1. Urinary retention - managed with a chronic Foley - had SPT, but it became dislodged  Chief Complaint  Patient presents with   Follow-up    HPI: [name] is a 73 y.o. man who presents today for penile pain with his caregiver, [name].    Previous records reviewed.  He states his indwelling Foley is bothersome to him because it is attached to an overnight bag and the overnight bag is getting full tugging on the catheter and in turn irritating his penis.  It was exchanged in the ED yesterday.    He would like to go back to the SPT, as he found more comfortable.  PMH: Past Medical History:  Diagnosis Date   (HFpEF) heart failure with preserved ejection fraction (HCC)    a.) TTE 03/20/2018: EF 60-65%, no RWMAs, mild LAE, mild-mod MR, PASP 42; b.) TTE 03/31/2021: EF 55-60%, no RWMAs, mild LVH, mild LAE, AoV sclerosis without stenosis; c.) TTE 03/25/2022: EF 60-65%, no RWMAs, AoV sclerosis without stenosis; d.) TTE 12/24/2022: EF 60-65%, no RWMAs, mild LVH, mild LAE.   Anasarca    Anemia    Anxiety    Aortic atherosclerosis    Arthritis    Asthma    Bacteremia due to Escherichia coli 05/2023   Bacteremia due to Klebsiella pneumoniae 2023   Bilateral carotid artery disease    Bradycardia    CAD (coronary artery disease) 10/21/2006   a.) MV 10/21/2006: small reversible defect involving the apical  lateral wall c/w possible ischemia; b.) MV: 03/24/2018: no ischemia; c.) MV 10/04/2021: LAD calcifications   CAD (coronary artery disease)    Cervical radiculopathy    Chest pain    a.) MV 03/21/2018: no ischemia; b.) MV 10/04/2021: no ischemia   Chronic back pain    Chronic common bile duct dilatation    Cirrhosis (HCC)    a.) (+) hepatic lesion on CT; likely hepatocellular carcinoma; b.) CT  evidence of associated portal hypertension; c.) (+) abdominopelvic anasarca   COPD (chronic obstructive pulmonary disease) (HCC)    DDD (degenerative disc disease), lumbar    Depression    Frequent falls    GERD (gastroesophageal reflux disease)    HBV (hepatitis B virus) infection    HCV (hepatitis C virus)    Hernia of anterior abdominal wall    History of 2019 novel coronavirus disease (COVID-19) 04/16/2020   Homelessness    a.) limited on placement as patient is listed on [place] sex offender listing for exploitation of a minor (possession of child pornography); incarcerated 12 years (released 11/2017)   Hyperlipidemia    Hypertension    Infestation by bed bug 02/2022   Malingering    Migraine    Nephrolithiasis    Obesity    Portal hypertension (HCC)    PTSD (post-traumatic stress disorder)    a.) brief training in the [employer] at age 89; someone was killed in front of him   Pulmonary HTN (HCC)    a.) multiple CT scans desmontrating dilated PA c/w pHTN; b.) TTE 03/20/2018: PASP 42 mmHg   Sleep apnea    a.) no nocturnal PAP therapy   Sleep apnea    Stenosis of cervical spine with myelopathy (HCC)    Suicidal ideations    T2DM (type 2 diabetes mellitus) (HCC)    Thrombocytopenia  a.) related to hepatic cirrhosis; spleen normal in size   TIA (transient ischemic attack)    TIA (transient ischemic attack) 09/30/2023    Surgical History: Past Surgical History:  Procedure Laterality Date   ANTERIOR CERVICAL DECOMP/DISCECTOMY FUSION N/A 07/01/2023   Procedure: ANTERIOR CERVICAL DISCECTOMY AND FUSION (FORGE);  Surgeon: Clois Fret, MD;  Location: [facility] ORS;  Service: Neurosurgery;  Laterality: N/A;  Keep as last case of the day per Dr Clois   ANTERIOR CERVICAL DISCECTOMY     APPENDECTOMY     CARDIAC CATHETERIZATION     CHOLECYSTECTOMY     COLONOSCOPY N/A 04/01/2024   Procedure: COLONOSCOPY;  Surgeon: Toledo, Ladell POUR, MD;  Location: [facility];  Service:  Gastroenterology;  Laterality: N/A;   ESOPHAGOGASTRODUODENOSCOPY N/A 04/01/2024   Procedure: EGD (ESOPHAGOGASTRODUODENOSCOPY);  Surgeon: Toledo, Ladell POUR, MD;  Location: [facility];  Service: Gastroenterology;  Laterality: N/A;   EYE SURGERY     INNER EAR SURGERY     IR CYSTOSTOMY TUBE PLACEMENT/BLADDER ASPIRATION  04/13/2024   NOSE SURGERY     POLYPECTOMY  04/01/2024   Procedure: POLYPECTOMY, INTESTINE;  Surgeon: Toledo, Ladell POUR, MD;  Location[facility] END[facility];  Service: Gastroenterology;;   XI ROBOTIC LAPAROSCOPIC ASSISTED APPENDECTOMY N/A 11/05/2022   Procedure: XI ROBOTIC LAPAROSCOPIC ASSISTED APPENDECTOMY;  Surgeon: Rodolph Romano, MD;  Location: [facility] ORS;  Service: General;  Laterality: N/A;    Home Medications:  Allergies as of 06/11/2024       Reactions   Bee Venom Anaphylaxis   Codeine  Itching   Only itching. Recently allergy tested at [name]   Rsv Mrna Pre-f Virus Vaccine Swelling   Meloxicam    Other Reaction(s): ?overdose of local given at dentist--had trouble breathing   Codeine     Novocain [procaine]    Sulfa Antibiotics Other (See Comments)   Sulfa Antibiotics    hallucinations        Medication List        Accurate as of June 11, 2024 11:59 AM. If you have any questions, ask your nurse or doctor.          acetaminophen  650 MG CR tablet Commonly known as: TYLENOL  Take 650 mg by mouth every 8 (eight) hours as needed for pain.   aspirin  81 MG chewable tablet Chew 81 mg by mouth daily.   atorvastatin  40 MG tablet Commonly known as: LIPITOR Take 40 mg by mouth daily.   benzocaine  10 % mucosal gel Commonly known as: ORAJEL Apply to the tip of the penis for penile pain up to four times daily prn for penile pain Started by: [name]   citalopram  20 MG tablet Commonly known as: CELEXA  Take 20 mg by mouth daily.   feeding supplement Liqd Take 237 mLs by mouth 3 (three) times daily between meals.   furosemide  20 MG tablet Commonly  known as: LASIX  Take 2 tablets (40 mg total) by mouth daily.   gabapentin  300 MG capsule Commonly known as: NEURONTIN  Take 300 mg by mouth 2 (two) times daily.   lactulose  10 GM/15ML solution Commonly known as: CHRONULAC  Take 67.5 mLs (45 g total) by mouth 3 (three) times daily. Please titrate to have 2-3 soft bowel movements daily   methocarbamol  500 MG tablet Commonly known as: ROBAXIN  Take 500 mg by mouth 4 (four) times daily.   multivitamin with minerals Tabs tablet Take 1 tablet by mouth daily.   nitroGLYCERIN  0.4 MG/SPRAY spray Commonly known as: NITROLINGUAL  Place 1 spray under the tongue every 5 (five)  minutes x 3 doses as needed for chest pain.   nystatin powder Commonly known as: MYCOSTATIN/NYSTOP Apply 1 Application topically 3 (three) times daily for 20 days.   ondansetron  4 MG tablet Commonly known as: ZOFRAN  Take 1 tablet (4 mg total) by mouth every 6 (six) hours as needed for nausea.   ondansetron  8 MG disintegrating tablet Commonly known as: ZOFRAN -ODT Take 8 mg by mouth every 6 (six) hours as needed for nausea.   pantoprazole  40 MG tablet Commonly known as: PROTONIX  Take 40 mg by mouth daily.   phenol 1.4 % Liqd Commonly known as: CHLORASEPTIC Use as directed 1 spray in the mouth or throat as needed for throat irritation / pain.   QUEtiapine  50 MG tablet Commonly known as: SEROQUEL  Take 50 mg by mouth at bedtime.   senna 8.6 MG Tabs tablet Commonly known as: SENOKOT Take 1 tablet (8.6 mg total) by mouth 2 (two) times daily as needed for mild constipation.   spironolactone  25 MG tablet Commonly known as: ALDACTONE  Take 1 tablet (25 mg total) by mouth daily.   Ventolin  HFA 108 (90 Base) MCG/ACT inhaler Generic drug: albuterol  Inhale 2 puffs into the lungs every 4 (four) hours as needed for wheezing.   albuterol  (2.5 MG/3ML) 0.083% nebulizer solution Commonly known as: PROVENTIL  Take 2.5 mg by nebulization every 4 (four) hours as needed for  wheezing or shortness of breath.   Xifaxan  550 MG Tabs tablet Generic drug: rifaximin  Take 550 mg by mouth 2 (two) times daily.        Allergies:  Allergies  Allergen Reactions   Bee Venom Anaphylaxis   Codeine  Itching    Only itching. Recently allergy tested at [name]   Rsv Mrna Pre-F Virus Vaccine Swelling   Meloxicam     Other Reaction(s): ?overdose of local given at dentist--had trouble breathing   Codeine     Novocain [Procaine]    Sulfa Antibiotics Other (See Comments)   Sulfa Antibiotics     hallucinations    Family History: Family History  Problem Relation Age of Onset   Hypertension Mother    Heart disease Mother    Heart failure Maternal Grandmother     Social History:  reports that he has been smoking cigarettes. He has never used smokeless tobacco. He reports that he does not currently use alcohol. He reports that he does not currently use drugs.  ROS: Pertinent ROS in HPI  Physical Exam: BP 95/65   Pulse (!) 46   Temp 98 F (36.7 C) (Oral)   Ht 5' 6 (1.676 m)   Wt 204 lb 6.4 oz (92.7 kg)   SpO2 98%   BMI 32.99 kg/m   Constitutional:  Well nourished. Alert and oriented, No acute distress. HEENT: [place] AT, moist mucus membranes.  Trachea midline Cardiovascular: No clubbing, cyanosis, or edema. Respiratory: Normal respiratory effort, no increased work of breathing. GU: No CVA tenderness.  No bladder fullness or masses.  Patient with circumcised phallus. Urethral meatus is patent.  No penile discharge. No penile lesions or rashes.  Neurologic: Grossly intact, no focal deficits, moving all 4 extremities. Psychiatric: Normal mood and affect.  Laboratory Data: Lab Results  Component Value Date   WBC 3.9 (L) 05/15/2024   HGB 12.5 (L) 05/15/2024   HCT 38.2 (L) 05/15/2024   MCV 89.0 05/15/2024   PLT 90 (L) 05/15/2024    Lab Results  Component Value Date   CREATININE 1.71 (H) 05/15/2024   Lab Results  Component Value Date  HGBA1C 4.9 09/30/2023   I have reviewed the labs.   Pertinent Imaging: N/A   Assessment & Plan:    1.  Chronic urinary retention -Overnight bag is exchanged to a leg bag and he is given a Dale strap -We will see if he can have another SPT placed, orders in -We will continue to change monthly and then after the SPT is placed and IR clears him for us  to exchange in the office we will do that as well  Return in about 1 month (around 07/12/2024) for Foley exchange .  These notes generated with voice recognition software. I apologize for typographical errors.  Drew HELON RIGGERS  [name] [address]  [address] [place], [place] [address] [phone]

## 2024-06-22 NOTE — Progress Notes (Signed)
 Patient for IR SPT Placement on Tues 06/23/24, I called and spoke with the patient's caretaker, [name] on the phone and gave pre-procedure instructions. [name] was made aware to have the patient here at 9a, last dose of ASA 81mg  was Wed 06/17/24, NPO after MN prior to procedure as well as driver post procedure/recovery/discharge. [name] stated understanding.  Called 06/22/24

## 2024-06-23 ENCOUNTER — Ambulatory Visit
Admission: RE | Admit: 2024-06-23 | Discharge: 2024-06-23 | Disposition: A | Discharge status: Home / Self Care | Source: Ambulatory Visit | Attending: Urology | Admitting: Urology

## 2024-06-23 DIAGNOSIS — Z978 Presence of other specified devices: Secondary | ICD-10-CM

## 2024-06-23 DIAGNOSIS — R32 Unspecified urinary incontinence: Secondary | ICD-10-CM

## 2024-06-23 MED ORDER — SODIUM CHLORIDE 0.9 % IV SOLN: INTRAVENOUS | Status: DC

## 2024-06-26 ENCOUNTER — Emergency Department

## 2024-06-26 ENCOUNTER — Inpatient Hospital Stay
Admission: EM | Admit: 2024-06-26 | Discharge: 2024-07-03 | DRG: 698 | Disposition: A | Discharge status: Home / Self Care

## 2024-06-26 ENCOUNTER — Other Ambulatory Visit: Payer: Self-pay

## 2024-06-26 DIAGNOSIS — B9689 Other specified bacterial agents as the cause of diseases classified elsewhere: Secondary | ICD-10-CM | POA: Diagnosis not present

## 2024-06-26 DIAGNOSIS — T83511A Infection and inflammatory reaction due to indwelling urethral catheter, initial encounter: Principal | ICD-10-CM | POA: Diagnosis present

## 2024-06-26 DIAGNOSIS — B964 Proteus (mirabilis) (morganii) as the cause of diseases classified elsewhere: Secondary | ICD-10-CM | POA: Diagnosis present

## 2024-06-26 DIAGNOSIS — I5032 Chronic diastolic (congestive) heart failure: Secondary | ICD-10-CM | POA: Diagnosis present

## 2024-06-26 DIAGNOSIS — K7682 Hepatic encephalopathy: Secondary | ICD-10-CM | POA: Diagnosis present

## 2024-06-26 DIAGNOSIS — A4150 Gram-negative sepsis, unspecified: Secondary | ICD-10-CM | POA: Diagnosis not present

## 2024-06-26 DIAGNOSIS — J4489 Other specified chronic obstructive pulmonary disease: Secondary | ICD-10-CM | POA: Diagnosis present

## 2024-06-26 DIAGNOSIS — N179 Acute kidney failure, unspecified: Secondary | ICD-10-CM | POA: Diagnosis present

## 2024-06-26 DIAGNOSIS — I2510 Atherosclerotic heart disease of native coronary artery without angina pectoris: Secondary | ICD-10-CM | POA: Diagnosis present

## 2024-06-26 DIAGNOSIS — E1122 Type 2 diabetes mellitus with diabetic chronic kidney disease: Secondary | ICD-10-CM | POA: Diagnosis present

## 2024-06-26 DIAGNOSIS — B952 Enterococcus as the cause of diseases classified elsewhere: Secondary | ICD-10-CM | POA: Diagnosis present

## 2024-06-26 DIAGNOSIS — D649 Anemia, unspecified: Secondary | ICD-10-CM

## 2024-06-26 DIAGNOSIS — G629 Polyneuropathy, unspecified: Secondary | ICD-10-CM

## 2024-06-26 DIAGNOSIS — E1140 Type 2 diabetes mellitus with diabetic neuropathy, unspecified: Secondary | ICD-10-CM | POA: Diagnosis present

## 2024-06-26 DIAGNOSIS — R7881 Bacteremia: Secondary | ICD-10-CM | POA: Diagnosis not present

## 2024-06-26 DIAGNOSIS — E871 Hypo-osmolality and hyponatremia: Secondary | ICD-10-CM | POA: Diagnosis present

## 2024-06-26 DIAGNOSIS — Y846 Urinary catheterization as the cause of abnormal reaction of the patient, or of later complication, without mention of misadventure at the time of the procedure: Secondary | ICD-10-CM | POA: Diagnosis present

## 2024-06-26 DIAGNOSIS — N289 Disorder of kidney and ureter, unspecified: Secondary | ICD-10-CM

## 2024-06-26 DIAGNOSIS — F32A Depression, unspecified: Secondary | ICD-10-CM | POA: Diagnosis present

## 2024-06-26 DIAGNOSIS — Z1152 Encounter for screening for COVID-19: Secondary | ICD-10-CM | POA: Diagnosis not present

## 2024-06-26 DIAGNOSIS — Z79899 Other long term (current) drug therapy: Secondary | ICD-10-CM

## 2024-06-26 DIAGNOSIS — Z8616 Personal history of COVID-19: Secondary | ICD-10-CM

## 2024-06-26 DIAGNOSIS — K7460 Unspecified cirrhosis of liver: Secondary | ICD-10-CM | POA: Diagnosis not present

## 2024-06-26 DIAGNOSIS — Z882 Allergy status to sulfonamides status: Secondary | ICD-10-CM

## 2024-06-26 DIAGNOSIS — F17210 Nicotine dependence, cigarettes, uncomplicated: Secondary | ICD-10-CM | POA: Diagnosis present

## 2024-06-26 DIAGNOSIS — E66811 Obesity, class 1: Secondary | ICD-10-CM | POA: Diagnosis present

## 2024-06-26 DIAGNOSIS — I4891 Unspecified atrial fibrillation: Secondary | ICD-10-CM | POA: Diagnosis present

## 2024-06-26 DIAGNOSIS — B1920 Unspecified viral hepatitis C without hepatic coma: Secondary | ICD-10-CM | POA: Diagnosis present

## 2024-06-26 DIAGNOSIS — D509 Iron deficiency anemia, unspecified: Secondary | ICD-10-CM | POA: Diagnosis present

## 2024-06-26 DIAGNOSIS — Z91030 Bee allergy status: Secondary | ICD-10-CM

## 2024-06-26 DIAGNOSIS — I272 Pulmonary hypertension, unspecified: Secondary | ICD-10-CM | POA: Diagnosis present

## 2024-06-26 DIAGNOSIS — D631 Anemia in chronic kidney disease: Secondary | ICD-10-CM | POA: Diagnosis present

## 2024-06-26 DIAGNOSIS — D696 Thrombocytopenia, unspecified: Secondary | ICD-10-CM | POA: Diagnosis present

## 2024-06-26 DIAGNOSIS — Z8673 Personal history of transient ischemic attack (TIA), and cerebral infarction without residual deficits: Secondary | ICD-10-CM

## 2024-06-26 DIAGNOSIS — A419 Sepsis, unspecified organism: Principal | ICD-10-CM

## 2024-06-26 DIAGNOSIS — N390 Urinary tract infection, site not specified: Secondary | ICD-10-CM | POA: Diagnosis not present

## 2024-06-26 DIAGNOSIS — Z885 Allergy status to narcotic agent status: Secondary | ICD-10-CM

## 2024-06-26 DIAGNOSIS — Z981 Arthrodesis status: Secondary | ICD-10-CM

## 2024-06-26 DIAGNOSIS — Z9049 Acquired absence of other specified parts of digestive tract: Secondary | ICD-10-CM

## 2024-06-26 DIAGNOSIS — K219 Gastro-esophageal reflux disease without esophagitis: Secondary | ICD-10-CM | POA: Diagnosis present

## 2024-06-26 DIAGNOSIS — Z66 Do not resuscitate: Secondary | ICD-10-CM | POA: Diagnosis present

## 2024-06-26 DIAGNOSIS — E785 Hyperlipidemia, unspecified: Secondary | ICD-10-CM | POA: Diagnosis present

## 2024-06-26 DIAGNOSIS — N1831 Chronic kidney disease, stage 3a: Secondary | ICD-10-CM | POA: Diagnosis present

## 2024-06-26 DIAGNOSIS — K7469 Other cirrhosis of liver: Secondary | ICD-10-CM | POA: Diagnosis present

## 2024-06-26 DIAGNOSIS — Z888 Allergy status to other drugs, medicaments and biological substances status: Secondary | ICD-10-CM

## 2024-06-26 DIAGNOSIS — I130 Hypertensive heart and chronic kidney disease with heart failure and stage 1 through stage 4 chronic kidney disease, or unspecified chronic kidney disease: Secondary | ICD-10-CM | POA: Diagnosis present

## 2024-06-26 DIAGNOSIS — A4159 Other Gram-negative sepsis: Secondary | ICD-10-CM | POA: Diagnosis present

## 2024-06-26 DIAGNOSIS — Z87442 Personal history of urinary calculi: Secondary | ICD-10-CM

## 2024-06-26 DIAGNOSIS — Z7982 Long term (current) use of aspirin: Secondary | ICD-10-CM

## 2024-06-26 DIAGNOSIS — Z8619 Personal history of other infectious and parasitic diseases: Secondary | ICD-10-CM

## 2024-06-26 DIAGNOSIS — Z6833 Body mass index (BMI) 33.0-33.9, adult: Secondary | ICD-10-CM | POA: Diagnosis not present

## 2024-06-26 DIAGNOSIS — Z978 Presence of other specified devices: Secondary | ICD-10-CM

## 2024-06-26 DIAGNOSIS — Z59868 Other specified financial insecurity: Secondary | ICD-10-CM

## 2024-06-26 DIAGNOSIS — Z604 Social exclusion and rejection: Secondary | ICD-10-CM | POA: Diagnosis present

## 2024-06-26 DIAGNOSIS — R9431 Abnormal electrocardiogram [ECG] [EKG]: Secondary | ICD-10-CM

## 2024-06-26 DIAGNOSIS — R6520 Severe sepsis without septic shock: Principal | ICD-10-CM | POA: Diagnosis present

## 2024-06-26 DIAGNOSIS — B961 Klebsiella pneumoniae [K. pneumoniae] as the cause of diseases classified elsewhere: Secondary | ICD-10-CM | POA: Diagnosis present

## 2024-06-26 DIAGNOSIS — E861 Hypovolemia: Secondary | ICD-10-CM | POA: Diagnosis present

## 2024-06-26 DIAGNOSIS — Z8249 Family history of ischemic heart disease and other diseases of the circulatory system: Secondary | ICD-10-CM

## 2024-06-26 LAB — COMPREHENSIVE METABOLIC PANEL WITH GFR
ALT: 28 U/L (ref 0–44)
AST: 53 U/L — ABNORMAL HIGH (ref 15–41)
Albumin: 2.1 g/dL — ABNORMAL LOW (ref 3.5–5.0)
Alkaline Phosphatase: 72 U/L (ref 38–126)
Anion gap: 8 (ref 5–15)
BUN: 41 mg/dL — ABNORMAL HIGH (ref 8–23)
CO2: 22 mmol/L (ref 22–32)
Calcium: 8.8 mg/dL — ABNORMAL LOW (ref 8.9–10.3)
Chloride: 100 mmol/L (ref 98–111)
Creatinine, Ser: 2.16 mg/dL — ABNORMAL HIGH (ref 0.61–1.24)
GFR, Estimated: 31 mL/min — ABNORMAL LOW (ref >60)
Glucose, Bld: 118 mg/dL — ABNORMAL HIGH (ref 70–99)
Potassium: 3.5 mmol/L (ref 3.5–5.1)
Sodium: 130 mmol/L — ABNORMAL LOW (ref 135–145)
Total Bilirubin: 2.1 mg/dL — ABNORMAL HIGH (ref 0.0–1.2)
Total Protein: 6.4 g/dL — ABNORMAL LOW (ref 6.5–8.1)

## 2024-06-26 LAB — PROTIME-INR
INR: 1.5 — ABNORMAL HIGH (ref 0.8–1.2)
Prothrombin Time: 18.7 s — ABNORMAL HIGH (ref 11.4–15.2)

## 2024-06-26 LAB — BLOOD GAS, VENOUS
Acid-Base Excess: 0.7 mmol/L (ref 0.0–2.0)
Bicarbonate: 25.4 mmol/L (ref 20.0–28.0)
O2 Saturation: 68.7 %
Patient temperature: 37
pCO2, Ven: 40 mmHg — ABNORMAL LOW (ref 44–60)
pH, Ven: 7.41 (ref 7.25–7.43)
pO2, Ven: 43 mmHg (ref 32–45)

## 2024-06-26 LAB — CBC WITH DIFFERENTIAL/PLATELET
Abs Immature Granulocytes: 0.1 K/uL — ABNORMAL HIGH (ref 0.00–0.07)
Basophils Absolute: 0 K/uL (ref 0.0–0.1)
Basophils Relative: 0 %
Eosinophils Absolute: 0.1 K/uL (ref 0.0–0.5)
Eosinophils Relative: 1 %
HCT: 29.3 % — ABNORMAL LOW (ref 39.0–52.0)
Hemoglobin: 9.6 g/dL — ABNORMAL LOW (ref 13.0–17.0)
Immature Granulocytes: 1 %
Lymphocytes Relative: 5 %
Lymphs Abs: 0.6 K/uL — ABNORMAL LOW (ref 0.7–4.0)
MCH: 29.2 pg (ref 26.0–34.0)
MCHC: 32.8 g/dL (ref 30.0–36.0)
MCV: 89.1 fL (ref 80.0–100.0)
Monocytes Absolute: 1 K/uL (ref 0.1–1.0)
Monocytes Relative: 8 %
Neutro Abs: 9.8 K/uL — ABNORMAL HIGH (ref 1.7–7.7)
Neutrophils Relative %: 85 %
Platelets: 61 K/uL — ABNORMAL LOW (ref 150–400)
RBC: 3.29 MIL/uL — ABNORMAL LOW (ref 4.22–5.81)
RDW: 17.3 % — ABNORMAL HIGH (ref 11.5–15.5)
Smear Review: NORMAL
WBC: 11.5 K/uL — ABNORMAL HIGH (ref 4.0–10.5)
nRBC: 0 % (ref 0.0–0.2)

## 2024-06-26 LAB — AMMONIA: Ammonia: 78 umol/L — ABNORMAL HIGH (ref 9–35)

## 2024-06-26 LAB — LACTIC ACID, PLASMA
Lactic Acid, Venous: 1.8 mmol/L (ref 0.5–1.9)
Lactic Acid, Venous: 1.6 mmol/L (ref 0.5–1.9)

## 2024-06-26 LAB — ETHANOL: Alcohol, Ethyl (B): <15 mg/dL (ref <15)

## 2024-06-26 LAB — MAGNESIUM: Magnesium: 1.7 mg/dL (ref 1.7–2.4)

## 2024-06-26 MED ORDER — LACTATED RINGERS IV SOLN: INTRAVENOUS | Status: DC

## 2024-06-26 MED ORDER — LACTATED RINGERS IV BOLUS (SEPSIS)
500.0000 mL | Freq: Once | INTRAVENOUS | Status: AC
  Administered 2024-06-26: 500 mL via INTRAVENOUS

## 2024-06-26 MED ORDER — SODIUM CHLORIDE 0.9 % IV SOLN
1.0000 g | Freq: Once | INTRAVENOUS | Status: AC
  Administered 2024-06-26: 1 g via INTRAVENOUS
  Filled 2024-06-26: qty 10

## 2024-06-26 MED ORDER — LACTATED RINGERS IV BOLUS
1000.0000 mL | Freq: Once | INTRAVENOUS | Status: AC
  Administered 2024-06-26: 1000 mL via INTRAVENOUS

## 2024-06-26 NOTE — ED Provider Notes (Signed)
 [facility] Provider Note    None    (approximate)   History   Fever   HPI  [name] is a 73 y.o. male  history of hepatitis C, liver cirrhosis, portal hypertension, COPD, hypertension, hyperlipidemia, diabetes, depression, history of TIA, frequent admissions for hepatic encephalopathy presents to our facility for worsening fever and confusion.  History is largely obtained by the EMS who brings the patient to our facility.  Patient was picked up from a group home where he was more confused than usual and spiking fevers.  It is unclear whether the patient has been compliant with medications.  Per EMS report, he was febrile to 103.5 on their arrival and was administered 1000 mg of acetaminophen .  Patient was brought to our facility.  He denies any falls but does not answer every question appropriately.  He denies any chest pain or shortness of breath  On review of the patient's chart, patient has been seen frequently in all facility on multiple occasions including 10/15 9/21 9/19.  He is on a high utilizer care plan as documented below.   --- ED HU CARE PLAN:  This patient is being monitored by the ED High Utilization Committee for frequent ED visits.  Care Plan Guidelines: Regardless of whether or not a patient has a care plan, every ED patient is entitled to a Medical Screening Exam (MSE) per EMTALA to determine if an Emergency Medical Condition [facility]) exists. Care plans are guidelines, and do not constitute a standard of care or substitute for clinical judgment.  Triage nurse: Pt is from care home. Start arranging transport as soon as dischage dispo is made, as there are often delays.  EDP recommendations: Remember chronic nature of his usual presentations (chronic Foley vs suprapubic catheter, chronic penile pain, chronic liver disease) when conducting appropriate medical screening exam. Urinalysis unlikely to be beneficial (given chronic catheter) unless  patient has signs/symptoms of active infection.   ICM recommendations and notes:  IF CAREGIVER PRESENTS with patient, reinforce proper catheter care. [facility] sent Epic Staff Message to Clotilda Cornwall PA-C (last visit with patient 10.16.25), copying Dr. Arlander, requesting Home Health RN order for catheter care and education, plan to stress the importance of calling them first instead of going to the ED. Resident of [facility]; primary emergency contact is employee of facility [phone]); call to [facility] who reports having spent extensive time with caregivers at [facility] (collection bag found at foot of bed by ankle rather than below knee level); [facility] advised they will continue education and reminder to call them before presenting to ED [facility]I Roster: No  Care Management Entity: [facility] Medicare Dual Complete [phone]) RN Care Manager: [facility]  Licensed Clinical Social Worker: [facility] BSW/Community Resources Care Guide: [employer]  Nursing/Secretary:       Physical Exam   Triage Vital Signs: ED Triage Vitals  Encounter Vitals Group     BP      Girls Systolic BP Percentile      Girls Diastolic BP Percentile      Boys Systolic BP Percentile      Boys Diastolic BP Percentile      Pulse      Resp      Temp      Temp src      SpO2      Weight      Height      Head Circumference      Peak Flow  Pain Score      Pain Loc      Pain Education      Exclude from Growth Chart     Most recent vital signs: Vitals:   06/26/24 2059  BP: (!) 100/58  Pulse: (!) 58  Resp: 19  Temp: (!) 100.8 F (38.2 C)  SpO2: 96%    Nursing Triage Note reviewed. Vital signs reviewed and patients oxygen saturation is normoxic he denies any chest pain  General: Patient is well nourished, well developed, lethargic, very diaphoretic Head: Normocephalic and atraumatic Eyes: Normal inspection, extraocular muscles intact, no conjunctival pallor Ear, nose, throat: Normal external  exam Neck: Normal range of motion Respiratory: Patient is in no respiratory distress, lungs CTAB Cardiovascular: Patient is not tachycardic, RR GI: Abd SNT with no guarding or rebound  Back: Normal inspection of the back with good strength and range of motion throughout all ext Extremities: pulses intact with good cap refills, no LE pitting edema or calf tenderness GU: Foley catheter in place, does appear to have sediment.  There is no penile or scrotal abnormalities Neuro: The patient is alert and oriented to person, place, not time, very easily confused, with 5/5 bilat UE/LE strength, no gross motor or sensory defects noted. Minimal asterexis   ED Results / Procedures / Treatments   Labs (all labs ordered are listed, but only abnormal results are displayed) Labs Reviewed  AMMONIA - Abnormal; Notable for the following components:      Result Value   Ammonia 78 (*)    All other components within normal limits  PROTIME-INR - Abnormal; Notable for the following components:   Prothrombin Time 18.7 (*)    INR 1.5 (*)    All other components within normal limits  CBC WITH DIFFERENTIAL/PLATELET - Abnormal; Notable for the following components:   WBC 11.5 (*)    RBC 3.29 (*)    Hemoglobin 9.6 (*)    HCT 29.3 (*)    RDW 17.3 (*)    Platelets 61 (*)    Neutro Abs 9.8 (*)    Lymphs Abs 0.6 (*)    Abs Immature Granulocytes 0.10 (*)    All other components within normal limits  COMPREHENSIVE METABOLIC PANEL WITH GFR - Abnormal; Notable for the following components:   Sodium 130 (*)    Glucose, Bld 118 (*)    BUN 41 (*)    Creatinine, Ser 2.16 (*)    Calcium  8.8 (*)    Total Protein 6.4 (*)    Albumin  2.1 (*)    AST 53 (*)    Total Bilirubin 2.1 (*)    GFR, Estimated 31 (*)    All other components within normal limits  BLOOD GAS, VENOUS - Abnormal; Notable for the following components:   pCO2, Ven 40 (*)    All other components within normal limits  RESP PANEL BY RT-PCR (RSV, FLU  A&B, COVID)  RVPGX2  CULTURE, BLOOD (ROUTINE X 2)  CULTURE, BLOOD (ROUTINE X 2)  URINE CULTURE  ETHANOL  LACTIC ACID, PLASMA  LACTIC ACID, PLASMA  MAGNESIUM   URINALYSIS, ROUTINE W REFLEX MICROSCOPIC     EKG EKG and rhythm strip are interpreted by myself:   EKG: slow afib rhythm] at heart rate of 58, normal QRS duration, QTc [548, nonspecific ST segments and T waves no ectopy EKG not consistent with Acute STEMI Rhythm strip: afib in lead II   RADIOLOGY Xray chest: No acute abnormality on my independent review interpretation radiologist agrees  PROCEDURES:  Critical Care performed: No  Procedures   MEDICATIONS ORDERED IN ED: Medications  lactated ringers  infusion ( Intravenous New Bag/Given 06/26/24 2308)  ceFEPIme  (MAXIPIME ) 1 g in sodium chloride  0.9 % 100 mL IVPB (0 g Intravenous Stopped 06/26/24 2229)  lactated ringers  bolus 500 mL (0 mLs Intravenous Stopped 06/26/24 2306)  lactated ringers  bolus 1,000 mL (1,000 mLs Intravenous New Bag/Given 06/26/24 2321)     IMPRESSION / MDM / ASSESSMENT AND PLAN / ED COURSE                                Differential diagnosis includes, but is not limited to, sepsis, pneumonia, UTI, electrolyte derangement   ED course: Patient arrives and he remains febrile and appears diaphoretic.  I reviewed the past notes and the care plan documented, however patient's presentation today seems to be an acute change from baseline.  Patient does have a new acute renal insufficiency.  He does have a slight leukocytosis as well.  His ammonia level appears at baseline for him.  Chest x-ray demonstrated no pneumonia.  On last check, patient's blood pressure was 96/67 and I do think this is consistent with sepsis.  He did give cefepime .  He does have evidence of slight fluid overload and so I have given gentle fluids.  Will discuss admission today with the hospitalist.   Of note I suspect the patient's source is likely his urine.  On last 919  urine culture, it does seem that it was sensitive to cefepime .  Patient will require a Foley change in 24 hours.    Clinical Course as of 06/26/24 2336  Fri Jun 26, 2024  2204 Blood gas, venous(!) No acidosis [HD]  2221 Ammonia(!): 78 [HD]  2222 Lactic Acid, Venous: 1.8 Not profoundly elevated [HD]  2239 DG Chest Portable 1 View No acute abnormality [HD]  2239 CBC with Differential(!) He does have a leukocytosis [HD]  2239 Creatinine(!): 2.16 Patient does have an acute renal insufficiency [HD]  2256 Patient's blood pressure was 96/67 and patient remains diaphoretic.  Will give more fluid [HD]  2333 Case discussed with hospitalist for admission [HD]    Clinical Course User Index [HD] Nicholaus Rolland BRAVO, MD   -- Risk: 5 This patient has a high risk of morbidity due to further diagnostic testing or treatment. Rationale: This patient's evaluation and management involve a high risk of morbidity due to the potential severity of presenting symptoms, need for diagnostic testing, and/or initiation of treatment that may require close monitoring. The differential includes conditions with potential for significant deterioration or requiring escalation of care. Treatment decisions in the ED, including medication administration, procedural interventions, or disposition planning, reflect this level of risk. COPA: 5 The patient has the following acute or chronic illness/injury that poses a possible threat to life or bodily function: [X] : The patient has a potentially serious acute condition or an acute exacerbation of a chronic illness requiring urgent evaluation and management in the Emergency Department. The clinical presentation necessitates immediate consideration of life-threatening or function-threatening diagnoses, even if they are ultimately ruled out.   FINAL CLINICAL IMPRESSION(S) / ED DIAGNOSES   Final diagnoses:  Sepsis with acute organ dysfunction, due to unspecified organism, unspecified  organ dysfunction type, unspecified whether septic shock present (HCC)  Chronic indwelling Foley catheter  Acute renal insufficiency  Prolonged Q-T interval on ECG     Rx / DC Orders   ED  Discharge Orders     None        Note:  This document was prepared using Dragon voice recognition software and may include unintentional dictation errors.   Nicholaus Rolland BRAVO, MD 06/26/24 [phone]

## 2024-06-26 NOTE — H&P (Addendum)
 [place]   PATIENT NAME: [name]    MR#:  [id number]  DATE OF BIRTH:  [birth year]  DATE OF ADMISSION:  06/26/2024  PRIMARY CARE PHYSICIAN: [employer]   Patient is coming from: Home  REQUESTING/REFERRING PHYSICIAN: Nicholaus Crazier, MD  CHIEF COMPLAINT:   Chief Complaint  Patient presents with   Fever    HISTORY OF PRESENT ILLNESS:  [name] is a 73 y.o. male with medical history significant for diastolic CHF with EF of 60 to 34%, anemia, asthma, history of E. coli and Klebsiella pneumoniae bacteremia, coronary artery disease, GERD, hypertension dyslipidemia, who presented to the ER with acute onset of fever that was up to 103.5 and chills with associated lethargy at his group home.  The patient has been having occasional cough with expectoration of clear sputum as well as wheezing and dyspnea.  He admitted to headache without dizziness or blurred vision.  No nausea or vomiting or abdominal pain.  No chest pain or palpitations.  No reported urinary symptoms however the patient was a fairly poor historian.  ED Course: When he came to the ER, shows 100.8 temp heart rate was 58 with BP of 100/58 and pulse 79 6% on room air.  Labs revealed positive UA for UTI.  BMP showed hyponatremia 130 and borderline potassium at 3.5, BUN of 41 and creatinine 2.16 above previous levels and VBG showed pH 7.41 and HCO3 25.4.  LFTs showed total protein 6.4 and AST 53.  Ammonia level was 78 compared to 91 on 9/20 and total bili 2.1.  Lactic acid was 1.8 and 1.6.  CBC showed mild leukocytosis of 11.5 with neutrophilia and anemia with hemoglobin 9.6 hematocrit 29.3 compared to 12.5 and 38.2 on 9/19, 10.7 and 33 on 05/01/2024.  INR was 1.5 and PT 18.7. EKG as reviewed by me : EKG showed atrial fibrillation with controlled rate response of 58 with nonspecific intraventricular conduction delay.. Imaging: Portable chest x-ray showed no acute cardiopulmonary disease.  The patient  was given IV cefepime  and hydration with IV lactated ringer .  He will be admitted to a progressive unit bed for further evaluation and management. PAST MEDICAL HISTORY:   Past Medical History:  Diagnosis Date   (HFpEF) heart failure with preserved ejection fraction (HCC)    a.) TTE 03/20/2018: EF 60-65%, no RWMAs, mild LAE, mild-mod MR, PASP 42; b.) TTE 03/31/2021: EF 55-60%, no RWMAs, mild LVH, mild LAE, AoV sclerosis without stenosis; c.) TTE 03/25/2022: EF 60-65%, no RWMAs, AoV sclerosis without stenosis; d.) TTE 12/24/2022: EF 60-65%, no RWMAs, mild LVH, mild LAE.   Anasarca    Anemia    Anxiety    Aortic atherosclerosis    Arthritis    Asthma    Bacteremia due to Escherichia coli 05/2023   Bacteremia due to Klebsiella pneumoniae 2023   Bilateral carotid artery disease    Bradycardia    CAD (coronary artery disease) 10/21/2006   a.) MV 10/21/2006: small reversible defect involving the apical  lateral wall c/w possible ischemia; b.) MV: 03/24/2018: no ischemia; c.) MV 10/04/2021: LAD calcifications   CAD (coronary artery disease)    Cervical radiculopathy    Chest pain    a.) MV 03/21/2018: no ischemia; b.) MV 10/04/2021: no ischemia   Chronic back pain    Chronic common bile duct dilatation    Cirrhosis (HCC)    a.) (+) hepatic lesion on CT; likely hepatocellular carcinoma; b.) CT evidence of associated portal  hypertension; c.) (+) abdominopelvic anasarca   COPD (chronic obstructive pulmonary disease) (HCC)    DDD (degenerative disc disease), lumbar    Depression    Frequent falls    GERD (gastroesophageal reflux disease)    HBV (hepatitis B virus) infection    HCV (hepatitis C virus)    Hernia of anterior abdominal wall    History of 2019 novel coronavirus disease (COVID-19) 04/16/2020   Homelessness    a.) limited on placement as patient is listed on [place] sex offender listing for exploitation of a minor (possession of child pornography); incarcerated 12 years (released  11/2017)   Hyperlipidemia    Hypertension    Infestation by bed bug 02/2022   Malingering    Migraine    Nephrolithiasis    Obesity    Portal hypertension (HCC)    PTSD (post-traumatic stress disorder)    a.) brief training in the [employer] at age 41; someone was killed in front of him   Pulmonary HTN (HCC)    a.) multiple CT scans desmontrating dilated PA c/w pHTN; b.) TTE 03/20/2018: PASP 42 mmHg   Sleep apnea    a.) no nocturnal PAP therapy   Sleep apnea    Stenosis of cervical spine with myelopathy (HCC)    Suicidal ideations    T2DM (type 2 diabetes mellitus) (HCC)    Thrombocytopenia    a.) related to hepatic cirrhosis; spleen normal in size   TIA (transient ischemic attack)    TIA (transient ischemic attack) 09/30/2023    PAST SURGICAL HISTORY:   Past Surgical History:  Procedure Laterality Date   ANTERIOR CERVICAL DECOMP/DISCECTOMY FUSION N/A 07/01/2023   Procedure: ANTERIOR CERVICAL DISCECTOMY AND FUSION (FORGE);  Surgeon: Clois Fret, MD;  Location: [facility] ORS;  Service: Neurosurgery;  Laterality: N/A;  Keep as last case of the day per Dr Clois   ANTERIOR CERVICAL DISCECTOMY     APPENDECTOMY     CARDIAC CATHETERIZATION     CHOLECYSTECTOMY     COLONOSCOPY N/A 04/01/2024   Procedure: COLONOSCOPY;  Surgeon: Toledo, Ladell POUR, MD;  Location: [facility];  Service: Gastroenterology;  Laterality: N/A;   ESOPHAGOGASTRODUODENOSCOPY N/A 04/01/2024   Procedure: EGD (ESOPHAGOGASTRODUODENOSCOPY);  Surgeon: Toledo, Ladell POUR, MD;  Location: [facility];  Service: Gastroenterology;  Laterality: N/A;   EYE SURGERY     INNER EAR SURGERY     IR CYSTOSTOMY TUBE PLACEMENT/BLADDER ASPIRATION  04/13/2024   NOSE SURGERY     POLYPECTOMY  04/01/2024   Procedure: POLYPECTOMY, INTESTINE;  Surgeon: Aundria, Ladell POUR, MD;  Location: [facility];  Service: Gastroenterology;;   XI ROBOTIC LAPAROSCOPIC ASSISTED APPENDECTOMY N/A 11/05/2022   Procedure: XI ROBOTIC LAPAROSCOPIC ASSISTED  APPENDECTOMY;  Surgeon: Rodolph Romano, MD;  Location: [facility] OR[facility];  Service: General;  Laterality: N/A;    SOCIAL HISTORY:   Social History   Tobacco Use   Smoking status: Some Days    Types: Cigarettes   Smokeless tobacco: Never   Tobacco comments:    2 times/month per patient   Substance Use Topics   Alcohol use: Not Currently    Comment: Unknown    FAMILY HISTORY:   Family History  Problem Relation Age of Onset   Hypertension Mother    Heart disease Mother    Heart failure Maternal Grandmother     DRUG ALLERGIES:   Allergies  Allergen Reactions   Bee Venom Anaphylaxis   Codeine  Itching    Only itching. Recently allergy tested at [facility]  Virus Vaccine Swelling   Meloxicam     Other Reaction(s): ?overdose of local given at dentist--had trouble breathing   Novocain [Procaine]    Sulfa Antibiotics     hallucinations    REVIEW OF SYSTEMS:   ROS As per history of present illness. All pertinent systems were reviewed above. Constitutional, HEENT, cardiovascular, respiratory, GI, GU, musculoskeletal, neuro, psychiatric, endocrine, integumentary and hematologic systems were reviewed and are otherwise negative/unremarkable except for positive findings mentioned above in the HPI.   MEDICATIONS AT HOME:   Prior to Admission medications   Medication Sig Start Date End Date Taking? Authorizing Provider  acetaminophen  (TYLENOL ) 650 MG CR tablet Take 650 mg by mouth every 8 (eight) hours as needed for pain.    [provider]  albuterol  (PROVENTIL ) (2.5 MG/3ML) 0.083% nebulizer solution Take 2.5 mg by nebulization every 4 (four) hours as needed for wheezing or shortness of breath. 07/16/23   [provider]  aspirin  81 MG chewable tablet Chew 81 mg by mouth daily.    [provider]  atorvastatin  (LIPITOR) 40 MG tablet Take 40 mg by mouth daily.    [provider]  benzocaine  (ORAJEL) 10 % mucosal gel Apply to the tip of  the penis for penile pain up to four times daily prn for penile pain 06/11/24   Helon Kirsch A, PA-C  citalopram  (CELEXA ) 20 MG tablet Take 20 mg by mouth daily.    [provider]  feeding supplement (ENSURE ENLIVE / ENSURE PLUS) LIQD Take 237 mLs by mouth 3 (three) times daily between meals. 10/25/23   Barbarann Nest, MD  furosemide  (LASIX ) 20 MG tablet Take 2 tablets (40 mg total) by mouth daily. 02/03/24 06/11/24  Donette Ellouise LABOR, FNP  gabapentin  (NEURONTIN ) 300 MG capsule Take 300 mg by mouth 2 (two) times daily. 06/07/23   [provider]  lactulose  (CHRONULAC ) 10 GM/15ML solution Take 67.5 mLs (45 g total) by mouth 3 (three) times daily. Please titrate to have 2-3 soft bowel movements daily 05/16/24   Gordan Huxley, MD  methocarbamol  (ROBAXIN ) 500 MG tablet Take 500 mg by mouth 4 (four) times daily.    [provider]  Multiple Vitamin (MULTIVITAMIN WITH MINERALS) TABS tablet Take 1 tablet by mouth daily.    [provider]  nitroGLYCERIN  (NITROLINGUAL ) 0.4 MG/SPRAY spray Place 1 spray under the tongue every 5 (five) minutes x 3 doses as needed for chest pain. 10/30/23   Donette Ellouise LABOR, FNP  nystatin (MYCOSTATIN/NYSTOP) powder Apply 1 Application topically 3 (three) times daily for 20 days. 06/10/24 06/30/24  Viviann Pastor, MD  ondansetron  (ZOFRAN ) 4 MG tablet Take 1 tablet (4 mg total) by mouth every 6 (six) hours as needed for nausea. 06/21/23   Josette Ade, MD  ondansetron  (ZOFRAN -ODT) 8 MG disintegrating tablet Take 8 mg by mouth every 6 (six) hours as needed for nausea. 01/24/23   [provider]  pantoprazole  (PROTONIX ) 40 MG tablet Take 40 mg by mouth daily. 09/24/23   [provider]  phenol (CHLORASEPTIC) 1.4 % LIQD Use as directed 1 spray in the mouth or throat as needed for throat irritation / pain.    [provider]  QUEtiapine  (SEROQUEL ) 50 MG tablet Take 50 mg by mouth at bedtime. 11/19/23   [provider]  senna (SENOKOT) 8.6 MG TABS tablet Take 1 tablet (8.6 mg total) by mouth 2 (two) times daily as needed for mild constipation. 07/02/23   Gregory Edsel Ruth, PA  spironolactone  (  ALDACTONE ) 25 MG tablet Take 1 tablet (25 mg total) by mouth daily. 01/12/24 06/11/24  Caleen Qualia, MD  VENTOLIN  HFA 108 (90 Base) MCG/ACT inhaler Inhale 2 puffs into the lungs every 4 (four) hours as needed for wheezing. 07/16/23   [provider]  XIFAXAN  550 MG TABS tablet Take 550 mg by mouth 2 (two) times daily. 09/24/23   [provider]      VITAL SIGNS:  Blood pressure 114/65, pulse (!) 57, temperature 99.3 F (37.4 C), temperature source Oral, resp. rate (!) 22, height 5' 6 (1.676 m), weight 94 kg, SpO2 99%.  PHYSICAL EXAMINATION:  Physical Exam  GENERAL:  73 y.o.-year-old Caucasian male patient lying in the bed with no acute distress.  EYES: Pupils equal, round, reactive to light and accommodation. No scleral icterus. Extraocular muscles intact.  HEENT: Head atraumatic, normocephalic. Oropharynx and nasopharynx clear.  NECK:  Supple, no jugular venous distention. No thyroid  enlargement, no tenderness.  LUNGS: Normal breath sounds bilaterally, no wheezing, rales,rhonchi or crepitation. No use of accessory muscles of respiration.  CARDIOVASCULAR: Regular rate and rhythm, S1, S2 normal. No murmurs, rubs, or gallops.  ABDOMEN: Soft, nondistended, nontender. Bowel sounds present. No organomegaly or mass.  EXTREMITIES: No pedal edema, cyanosis, or clubbing.  NEUROLOGIC: Cranial nerves II through XII are intact. Muscle strength 5/5 in all extremities. Sensation intact. Gait not checked.  PSYCHIATRIC: The patient is alert and oriented x 3.  Normal affect and good eye contact. SKIN: No obvious rash, lesion, or ulcer.   LABORATORY PANEL:   CBC Recent Labs  Lab 06/26/24 2100  WBC 11.5*  HGB 9.6*  HCT 29.3*  PLT 61*    ------------------------------------------------------------------------------------------------------------------  Chemistries  Recent Labs  Lab 06/26/24 2100 06/26/24 2130  NA 130*  --   K 3.5  --   CL 100  --   CO2 22  --   GLUCOSE 118*  --   BUN 41*  --   CREATININE 2.16*  --   CALCIUM  8.8*  --   MG  --  1.7  AST 53*  --   ALT 28  --   ALKPHOS 72  --   BILITOT 2.1*  --    ------------------------------------------------------------------------------------------------------------------  Cardiac Enzymes No results for input(s): TROPONINI in the last 168 hours. ------------------------------------------------------------------------------------------------------------------  RADIOLOGY:  DG Chest Portable 1 View Result Date: 06/26/2024 CLINICAL DATA:  Short of breath, confusion, sepsis EXAM: PORTABLE CHEST 1 VIEW COMPARISON:  02/17/2024 FINDINGS: Single frontal view of the chest demonstrates a stable cardiac silhouette. No acute airspace disease, effusion, or pneumothorax. No acute bony abnormalities. IMPRESSION: 1. No acute intrathoracic process. Electronically Signed   By: Ozell Daring M.D.   On: 06/26/2024 22:22      IMPRESSION AND PLAN:  Assessment and Plan: * Sepsis due to gram-negative UTI [facility]) - The patient will be admitted to a vestibular bed. - Will continue antibiotic therapy with IV Rocephin . - He will be hydrated with IV lactated ringer . - Sepsis is manifested by fever 100.8 and tachypnea of 22. - Will follow blood and urine cultures.  Hyponatremia - This is likely hypovolemic. - The patient will be hydrated and will follow BMP  Acute on chronic anemia - Will obtain anemia workup. - Will monitor H&H.  Acute hepatic encephalopathy (HCC) - He will be placed on lactulose . - Will continue rifaximin  and Aldactone  given his history of post hepatic cirrhosis.  Depression - Will continue Celexa  and Seroquel .  GERD without esophagitis - Will  continue PPI therapy.  Peripheral neuropathy - We will continue Neurontin .  Dyslipidemia - Continue statin therapy.       DVT prophylaxis: Lovenox .  Advanced Care Planning:  Code Status: full code.  Family Communication:  The plan of care was discussed in details with the patient (and family). I answered all questions. The patient agreed to proceed with the above mentioned plan. Further management will depend upon hospital course. Disposition Plan: Back to previous home environment Consults called: none.  All the records are reviewed and case discussed with ED provider.  Status is: Inpatient    At the time of the admission, it appears that the appropriate admission status for this patient is inpatient.  This is judged to be reasonable and necessary in order to provide the required intensity of service to ensure the patient's safety given the presenting symptoms, physical exam findings and initial radiographic and laboratory data in the context of comorbid conditions.  The patient requires inpatient status due to high intensity of service, high risk of further deterioration and high frequency of surveillance required.  I certify that at the time of admission, it is my clinical judgment that the patient will require inpatient hospital care extending more than 2 midnights.                            Dispo: The patient is from: Home              Anticipated d/c is to: Home              Patient currently is not medically stable to d/c.              Difficult to place patient: No  [place] DELENA Peaches M.D on 06/27/2024 at 5:59 AM  [facility]   From 7 PM-7 AM, contact night-coverage [email or web address]  CC: Primary care physician; [facility], [employer]

## 2024-06-26 NOTE — Consult Note (Signed)
 CODE SEPSIS - PHARMACY COMMUNICATION  **Broad Spectrum Antibiotics should be administered within 1 hour of Sepsis diagnosis**  Time Code Sepsis Called/Page Received: 2112  Antibiotics Ordered: Cefepime  1G x1  Time of 1st antibiotic administration: 2156  Additional action taken by pharmacy: none  If necessary, Name of Provider/Nurse Contacted: n/a    Annabella LOISE Banks ,PharmD Clinical Pharmacist  06/26/2024  9:13 PM

## 2024-06-26 NOTE — Sepsis Progress Note (Signed)
 Elink monitoring for the code sepsis protocol.

## 2024-06-27 DIAGNOSIS — N179 Acute kidney failure, unspecified: Secondary | ICD-10-CM | POA: Insufficient documentation

## 2024-06-27 DIAGNOSIS — E871 Hypo-osmolality and hyponatremia: Secondary | ICD-10-CM

## 2024-06-27 DIAGNOSIS — G629 Polyneuropathy, unspecified: Secondary | ICD-10-CM

## 2024-06-27 DIAGNOSIS — N390 Urinary tract infection, site not specified: Secondary | ICD-10-CM | POA: Diagnosis not present

## 2024-06-27 DIAGNOSIS — D649 Anemia, unspecified: Secondary | ICD-10-CM

## 2024-06-27 DIAGNOSIS — K219 Gastro-esophageal reflux disease without esophagitis: Secondary | ICD-10-CM | POA: Insufficient documentation

## 2024-06-27 DIAGNOSIS — E785 Hyperlipidemia, unspecified: Secondary | ICD-10-CM | POA: Insufficient documentation

## 2024-06-27 DIAGNOSIS — A4150 Gram-negative sepsis, unspecified: Secondary | ICD-10-CM | POA: Diagnosis not present

## 2024-06-27 LAB — RETICULOCYTES
Immature Retic Fract: 25.9 % — ABNORMAL HIGH (ref 2.3–15.9)
RBC.: 3.62 MIL/uL — ABNORMAL LOW (ref 4.22–5.81)
Retic Count, Absolute: 51.4 K/uL (ref 19.0–186.0)
Retic Ct Pct: 1.4 % (ref 0.4–3.1)

## 2024-06-27 LAB — BLOOD CULTURE ID PANEL (REFLEXED) - BCID2

## 2024-06-27 LAB — IRON AND TIBC
Iron: 19 ug/dL — ABNORMAL LOW (ref 45–182)
Saturation Ratios: 8 % — ABNORMAL LOW (ref 17.9–39.5)
TIBC: 234 ug/dL — ABNORMAL LOW (ref 250–450)
UIBC: 215 ug/dL

## 2024-06-27 LAB — CORTISOL-AM, BLOOD: Cortisol - AM: 19.5 ug/dL (ref 6.7–22.6)

## 2024-06-27 LAB — CBC
HCT: 31.9 % — ABNORMAL LOW (ref 39.0–52.0)
Hemoglobin: 10.6 g/dL — ABNORMAL LOW (ref 13.0–17.0)
MCH: 29 pg (ref 26.0–34.0)
MCHC: 33.2 g/dL (ref 30.0–36.0)
MCV: 87.4 fL (ref 80.0–100.0)
Platelets: 65 K/uL — ABNORMAL LOW (ref 150–400)
RBC: 3.65 MIL/uL — ABNORMAL LOW (ref 4.22–5.81)
RDW: 17.3 % — ABNORMAL HIGH (ref 11.5–15.5)
WBC: 9 K/uL (ref 4.0–10.5)
nRBC: 0 % (ref 0.0–0.2)

## 2024-06-27 LAB — BASIC METABOLIC PANEL WITH GFR
Anion gap: 6 (ref 5–15)
BUN: 38 mg/dL — ABNORMAL HIGH (ref 8–23)
CO2: 24 mmol/L (ref 22–32)
Calcium: 9.1 mg/dL (ref 8.9–10.3)
Chloride: 104 mmol/L (ref 98–111)
Creatinine, Ser: 2.17 mg/dL — ABNORMAL HIGH (ref 0.61–1.24)
GFR, Estimated: 31 mL/min — ABNORMAL LOW (ref >60)
Glucose, Bld: 75 mg/dL (ref 70–99)
Potassium: 4 mmol/L (ref 3.5–5.1)
Sodium: 134 mmol/L — ABNORMAL LOW (ref 135–145)

## 2024-06-27 LAB — URINALYSIS, ROUTINE W REFLEX MICROSCOPIC
Bilirubin Urine: NEGATIVE
Glucose, UA: NEGATIVE mg/dL
Ketones, ur: NEGATIVE mg/dL
Nitrite: NEGATIVE
Protein, ur: 30 mg/dL — AB
Specific Gravity, Urine: 1.018 (ref 1.005–1.030)
Squamous Epithelial / HPF: 0 /HPF (ref 0–5)
WBC, UA: >50 WBC/hpf (ref 0–5)
pH: 7 (ref 5.0–8.0)

## 2024-06-27 LAB — VITAMIN B12: Vitamin B-12: 940 pg/mL — ABNORMAL HIGH (ref 180–914)

## 2024-06-27 LAB — RESP PANEL BY RT-PCR (RSV, FLU A&B, COVID)  RVPGX2
Influenza A by PCR: NEGATIVE
Influenza B by PCR: NEGATIVE
Resp Syncytial Virus by PCR: NEGATIVE
SARS Coronavirus 2 by RT PCR: NEGATIVE

## 2024-06-27 LAB — PROTIME-INR
INR: 1.4 — ABNORMAL HIGH (ref 0.8–1.2)
Prothrombin Time: 18.2 s — ABNORMAL HIGH (ref 11.4–15.2)

## 2024-06-27 LAB — LACTATE DEHYDROGENASE: LDH: 165 U/L (ref 98–192)

## 2024-06-27 LAB — FERRITIN: Ferritin: 91 ng/mL (ref 24–336)

## 2024-06-27 LAB — FOLATE: Folate: >20 ng/mL (ref >5.9)

## 2024-06-27 MED ORDER — METHOCARBAMOL 500 MG PO TABS
500.0000 mg | ORAL_TABLET | Freq: Four times a day (QID) | ORAL | Status: DC
  Administered 2024-06-27 – 2024-07-03 (×25): 500 mg via ORAL
  Filled 2024-06-27 (×25): qty 1

## 2024-06-27 MED ORDER — ACETAMINOPHEN 650 MG RE SUPP: 650.0000 mg | Freq: Four times a day (QID) | RECTAL | Status: DC | PRN

## 2024-06-27 MED ORDER — GABAPENTIN 300 MG PO CAPS
300.0000 mg | ORAL_CAPSULE | Freq: Two times a day (BID) | ORAL | Status: DC
  Administered 2024-06-27 – 2024-07-03 (×13): 300 mg via ORAL
  Filled 2024-06-27 (×13): qty 1

## 2024-06-27 MED ORDER — ATORVASTATIN CALCIUM 20 MG PO TABS
40.0000 mg | ORAL_TABLET | Freq: Every day | ORAL | Status: DC
  Administered 2024-06-27 – 2024-07-03 (×7): 40 mg via ORAL
  Filled 2024-06-27 (×7): qty 2

## 2024-06-27 MED ORDER — LACTATED RINGERS IV SOLN
150.0000 mL/h | INTRAVENOUS | Status: DC
  Administered 2024-06-27: 150 mL/h via INTRAVENOUS

## 2024-06-27 MED ORDER — SODIUM CHLORIDE 0.9 % IV SOLN
2.0000 g | INTRAVENOUS | Status: AC
  Administered 2024-06-27 – 2024-07-03 (×7): 2 g via INTRAVENOUS
  Filled 2024-06-27 (×7): qty 20

## 2024-06-27 MED ORDER — GUAIFENESIN ER 600 MG PO TB12
600.0000 mg | ORAL_TABLET | Freq: Two times a day (BID) | ORAL | Status: DC
Start: 2024-06-27 — End: 2024-07-03
  Administered 2024-06-27 – 2024-07-03 (×13): 600 mg via ORAL
  Filled 2024-06-27 (×13): qty 1

## 2024-06-27 MED ORDER — CHLORHEXIDINE GLUCONATE CLOTH 2 % EX PADS
6.0000 | MEDICATED_PAD | Freq: Every day | CUTANEOUS | Status: DC
  Administered 2024-06-27 – 2024-07-03 (×7): 6 via TOPICAL
  Filled 2024-06-27: qty 6

## 2024-06-27 MED ORDER — ENSURE ENLIVE PO LIQD
237.0000 mL | Freq: Three times a day (TID) | ORAL | Status: DC
  Administered 2024-06-27 – 2024-07-03 (×19): 237 mL via ORAL
  Filled 2024-06-27: qty 237

## 2024-06-27 MED ORDER — CITALOPRAM HYDROBROMIDE 20 MG PO TABS
20.0000 mg | ORAL_TABLET | Freq: Every day | ORAL | Status: DC
  Administered 2024-06-27 – 2024-07-03 (×7): 20 mg via ORAL
  Filled 2024-06-27 (×7): qty 1

## 2024-06-27 MED ORDER — LACTULOSE 10 GM/15ML PO SOLN
45.0000 g | Freq: Three times a day (TID) | ORAL | Status: DC
  Administered 2024-06-27 – 2024-07-02 (×13): 45 g via ORAL
  Filled 2024-06-27 (×18): qty 90

## 2024-06-27 MED ORDER — METOCLOPRAMIDE HCL 5 MG/ML IJ SOLN: 10.0000 mg | Freq: Four times a day (QID) | INTRAMUSCULAR | Status: DC | PRN

## 2024-06-27 MED ORDER — ADULT MULTIVITAMIN W/MINERALS CH
1.0000 | ORAL_TABLET | Freq: Every day | ORAL | Status: DC
  Administered 2024-06-27 – 2024-07-03 (×7): 1 via ORAL
  Filled 2024-06-27 (×7): qty 1

## 2024-06-27 MED ORDER — QUETIAPINE FUMARATE 25 MG PO TABS
50.0000 mg | ORAL_TABLET | Freq: Every day | ORAL | Status: DC
  Administered 2024-06-27 – 2024-07-02 (×6): 50 mg via ORAL
  Filled 2024-06-27 (×6): qty 2

## 2024-06-27 MED ORDER — SPIRONOLACTONE 25 MG PO TABS: 25.0000 mg | ORAL_TABLET | Freq: Every day | ORAL | Status: DC

## 2024-06-27 MED ORDER — TRAZODONE HCL 50 MG PO TABS: 25.0000 mg | ORAL_TABLET | Freq: Every evening | ORAL | Status: DC | PRN

## 2024-06-27 MED ORDER — SPIRONOLACTONE 12.5 MG HALF TABLET
12.5000 mg | ORAL_TABLET | Freq: Every day | ORAL | Status: DC
Start: 2024-06-27 — End: 2024-07-03
  Administered 2024-06-27 – 2024-07-03 (×7): 12.5 mg via ORAL
  Filled 2024-06-27 (×7): qty 1

## 2024-06-27 MED ORDER — NYSTATIN 100000 UNIT/GM EX POWD
1.0000 | Freq: Three times a day (TID) | CUTANEOUS | Status: DC
  Administered 2024-06-27 – 2024-07-03 (×15): 1 via TOPICAL
  Filled 2024-06-27 (×2): qty 15

## 2024-06-27 MED ORDER — RIFAXIMIN 550 MG PO TABS
550.0000 mg | ORAL_TABLET | Freq: Two times a day (BID) | ORAL | Status: DC
  Administered 2024-06-27 – 2024-07-03 (×13): 550 mg via ORAL
  Filled 2024-06-27 (×14): qty 1

## 2024-06-27 MED ORDER — PHENOL 1.4 % MT LIQD
1.0000 | OROMUCOSAL | Status: DC | PRN
  Administered 2024-07-02: 1 via OROMUCOSAL
  Filled 2024-06-27: qty 177

## 2024-06-27 MED ORDER — NITROGLYCERIN 0.4 MG SL SUBL
0.4000 mg | SUBLINGUAL_TABLET | SUBLINGUAL | Status: DC | PRN
  Administered 2024-07-03: 0.4 mg via SUBLINGUAL
  Filled 2024-06-27: qty 1

## 2024-06-27 MED ORDER — ALBUTEROL SULFATE (2.5 MG/3ML) 0.083% IN NEBU: 2.5000 mg | INHALATION_SOLUTION | RESPIRATORY_TRACT | Status: DC | PRN

## 2024-06-27 MED ORDER — PANTOPRAZOLE SODIUM 40 MG PO TBEC
40.0000 mg | DELAYED_RELEASE_TABLET | Freq: Every day | ORAL | Status: DC
  Administered 2024-06-27 – 2024-07-03 (×7): 40 mg via ORAL
  Filled 2024-06-27 (×7): qty 1

## 2024-06-27 MED ORDER — ACETAMINOPHEN 325 MG PO TABS
650.0000 mg | ORAL_TABLET | Freq: Four times a day (QID) | ORAL | Status: DC | PRN
  Administered 2024-06-27 (×2): 650 mg via ORAL
  Filled 2024-06-27 (×2): qty 2

## 2024-06-27 MED ORDER — ASPIRIN 81 MG PO CHEW
81.0000 mg | CHEWABLE_TABLET | Freq: Every day | ORAL | Status: DC
  Administered 2024-06-27 – 2024-07-03 (×7): 81 mg via ORAL
  Filled 2024-06-27 (×7): qty 1

## 2024-06-27 MED ORDER — SENNA 8.6 MG PO TABS
1.0000 | ORAL_TABLET | Freq: Two times a day (BID) | ORAL | Status: DC | PRN
Start: 2024-06-27 — End: 2024-07-03

## 2024-06-27 MED ORDER — ENOXAPARIN SODIUM 60 MG/0.6ML IJ SOSY
45.0000 mg | PREFILLED_SYRINGE | INTRAMUSCULAR | Status: DC
Start: 2024-06-27 — End: 2024-06-27

## 2024-06-27 NOTE — Progress Notes (Signed)
 PHARMACY - PHYSICIAN COMMUNICATION CRITICAL VALUE ALERT - BLOOD CULTURE IDENTIFICATION (BCID)  [name] is an 73 y.o. male who presented to [facility] on 06/26/2024 with a chief complaint of urosepsis  Assessment: 2/4 bottles (different sets) GNR. BCID detected Klebsiella oxytoca without resistance. Suspect urinary source.   Name of physician (or Provider) Contacted: Dr. Marsa  Current antibiotics: Ceftriaxone  2 g IV q24h  Changes to prescribed antibiotics recommended:  Patient is on recommended antibiotics - No changes needed  Results for orders placed or performed during the hospital encounter of 06/26/24  Blood Culture ID Panel (Reflexed) (Collected: 06/26/2024  9:27 PM)  Result Value Ref Range   Enterococcus faecalis NOT DETECTED NOT DETECTED   Enterococcus Faecium NOT DETECTED NOT DETECTED   Listeria monocytogenes NOT DETECTED NOT DETECTED   Staphylococcus species NOT DETECTED NOT DETECTED   Staphylococcus aureus (BCID) NOT DETECTED NOT DETECTED   Staphylococcus epidermidis NOT DETECTED NOT DETECTED   Staphylococcus lugdunensis NOT DETECTED NOT DETECTED   Streptococcus species NOT DETECTED NOT DETECTED   Streptococcus agalactiae NOT DETECTED NOT DETECTED   Streptococcus pneumoniae NOT DETECTED NOT DETECTED   Streptococcus pyogenes NOT DETECTED NOT DETECTED   A.calcoaceticus-baumannii NOT DETECTED NOT DETECTED   Bacteroides fragilis NOT DETECTED NOT DETECTED   Enterobacterales DETECTED (A) NOT DETECTED   Enterobacter cloacae complex NOT DETECTED NOT DETECTED   Escherichia coli NOT DETECTED NOT DETECTED   Klebsiella aerogenes NOT DETECTED NOT DETECTED   Klebsiella oxytoca DETECTED (A) NOT DETECTED   Klebsiella pneumoniae NOT DETECTED NOT DETECTED   Proteus species NOT DETECTED NOT DETECTED   Salmonella species NOT DETECTED NOT DETECTED   Serratia marcescens NOT DETECTED NOT DETECTED   Haemophilus influenzae NOT DETECTED NOT DETECTED   Neisseria meningitidis NOT  DETECTED NOT DETECTED   Pseudomonas aeruginosa NOT DETECTED NOT DETECTED   Stenotrophomonas maltophilia NOT DETECTED NOT DETECTED   Candida albicans NOT DETECTED NOT DETECTED   Candida auris NOT DETECTED NOT DETECTED   Candida glabrata NOT DETECTED NOT DETECTED   Candida krusei NOT DETECTED NOT DETECTED   Candida parapsilosis NOT DETECTED NOT DETECTED   Candida tropicalis NOT DETECTED NOT DETECTED   Cryptococcus neoformans/gattii NOT DETECTED NOT DETECTED   CTX-M ESBL NOT DETECTED NOT DETECTED   Carbapenem resistance IMP NOT DETECTED NOT DETECTED   Carbapenem resistance KPC NOT DETECTED NOT DETECTED   Carbapenem resistance NDM NOT DETECTED NOT DETECTED   Carbapenem resist OXA 48 LIKE NOT DETECTED NOT DETECTED   Carbapenem resistance VIM NOT DETECTED NOT DETECTED    Marolyn KATHEE Mare 06/27/2024  11:33 AM

## 2024-06-27 NOTE — ED Notes (Signed)
 Pt alert and oriented x4, denies pain. Lung sound clear bilaterally, respirations even and unlabored. Pt assisted with breakfast tray.

## 2024-06-27 NOTE — Assessment & Plan Note (Signed)
-   The patient will be admitted to a vestibular bed. - Will continue antibiotic therapy with IV Rocephin . - He will be hydrated with IV lactated ringer . - Sepsis is manifested by fever 100.8 and tachypnea of 22. - Will follow blood and urine cultures.

## 2024-06-27 NOTE — Assessment & Plan Note (Signed)
-   He will be placed on lactulose . - Will continue rifaximin  and Aldactone  given his history of post hepatic cirrhosis.

## 2024-06-27 NOTE — Assessment & Plan Note (Signed)
-   Will continue Celexa  and Seroquel .

## 2024-06-27 NOTE — Progress Notes (Signed)
 PHARMACIST - PHYSICIAN COMMUNICATION  CONCERNING:  Enoxaparin  (Lovenox ) for DVT Prophylaxis    RECOMMENDATION: Patient was prescribed enoxaprin 40mg  q24 hours for VTE prophylaxis.   Filed Weights   06/26/24 2101  Weight: 94 kg (207 lb 3.7 oz)    Body mass index is 33.45 kg/m.  Estimated Creatinine Clearance: 32.2 mL/min (A) (by C-G formula based on SCr of 2.16 mg/dL (H)).   Based on [facility] policy patient is candidate for enoxaparin  0.5mg /kg TBW SQ every 24 hours based on BMI being >30.  DESCRIPTION: Pharmacy has adjusted enoxaparin  dose per [facility] policy.  Patient is now receiving enoxaparin  0.5 mg/kg every 24 hours   Rankin CANDIE Dills, PharmD, [facility] 06/27/2024 1:54 AM

## 2024-06-27 NOTE — Assessment & Plan Note (Signed)
 Continue statin therapy

## 2024-06-27 NOTE — Hospital Course (Addendum)
 Hospital course / significant events:   [name] is a 73 y.o. male with medical history significant for diastolic CHF with EF of 60 to 34%, anemia, asthma, history of E. coli and Klebsiella pneumoniae bacteremia, coronary artery disease, GERD, CKD3a, hypertension dyslipidemia, who presented to the ER with acute onset of fever that was up to 103.5 and chills with associated lethargy at his group home.  The patient has been having occasional cough with expectoration of clear sputum as well as wheezing and dyspnea.  He admitted to headache without dizziness or blurred vision.  No nausea or vomiting or abdominal pain.  No chest pain or palpitations.  No reported urinary symptoms however the patient was a fairly poor historian.   ED Course: When he came to the ER, shows 100.8 temp heart rate was 58 with BP of 100/58 and pulse 79 6% on room air.  Labs revealed positive UA for UTI.  BMP showed hyponatremia 130 and borderline potassium at 3.5, BUN of 41 and creatinine 2.16 above previous levels and VBG showed pH 7.41 and HCO3 25.4.  LFTs showed total protein 6.4 and AST 53.  Ammonia level was 78 compared to 91 on 9/20 and total bili 2.1.  Lactic acid was 1.8 and 1.6.  CBC showed mild leukocytosis of 11.5 with neutrophilia and anemia with hemoglobin 9.6 hematocrit 29.3 compared to 12.5 and 38.2 on 9/19, 10.7 and 33 on 05/01/2024.  INR was 1.5 and PT 18.7. EKG as reviewed by me : EKG showed atrial fibrillation with controlled rate response of 58 with nonspecific intraventricular conduction delay.. Imaging: Portable chest x-ray showed no acute cardiopulmonary disease.   The patient was given IV cefepime  and hydration with IV lactated ringer .  He will be admitted to a progressive unit bed for further evaluation and management.  06/27/24 (+)bacteremia, remain on IV abx for now, Foley exchanged    Consultants:  none  Procedures/Surgeries: none      ASSESSMENT & PLAN:   Sepsis due to gram-negative UTI    Bacteremia  Sepsis is manifested by fever 100.8 and tachypnea of 22. IV Rocephin . IV lactated ringer  can dc  follow blood and urine cultures.   Hyponatremia - improved  Liver disease, renal disease, hypovolemia  Follow BMP   AKI on CKD3a Hydrating Monitor BMP  Acute on chronic anemia Iron deficiency Iron supplement Monitor CBC   Thrombocytopenia, chornic D/t liver disease  Monitor CBC  Acute hepatic encephalopathy  lactulose . rifaximin   Reduced dose Aldactone  w/ soft BP Delirium precautions  Monitor ammonia    Depression Celexa  and Seroquel .   GERD without esophagitis PPI   Peripheral neuropathy Neurontin .   Dyslipidemia statin     Class 1 obesity based on BMI: Body mass index is 33.45 kg/m.[name] Significantly low or high BMI is associated with higher medical risk.  Underweight - under 18  overweight - 25 to 29 obese - 30 or more Class 1 obesity: BMI of 30.0 to 34 Class 2 obesity: BMI of 35.0 to 39 Class 3 obesity: BMI of 40.0 to 49 Super Morbid Obesity: BMI 50-59 Super-super Morbid Obesity: BMI 60+ Healthy nutrition and physical activity advised as adjunct to other disease management and risk reduction treatments    DVT prophylaxis: SCD given low plt bleed risk  IV fluids: LR continuous IV fluids w/ AKI Nutrition: cardiac diet  Central lines / other devices: none  Code Status: DNR ACP documentation reviewed: has DNR and MOST on file in [facility]  TOC needs: TBD -  expect back to group home, possibly HH/SNF pending clinical course  Medical barriers to dispo: pending cultures, encephalopathy. Expected medical readiness for discharge 2-3 days.

## 2024-06-27 NOTE — Assessment & Plan Note (Signed)
-   This is likely hypovolemic. - The patient will be hydrated and will follow BMP

## 2024-06-27 NOTE — Assessment & Plan Note (Signed)
 -  We will continue Neurontin. ?

## 2024-06-27 NOTE — Progress Notes (Signed)
 PROGRESS NOTE    [name]   FMW:[id number] DOB: 1951/02/04  DOA: 06/26/2024 Date of Service: 06/27/24 which is hospital day 1  PCP: [facility], [facility] course / significant events:   [name] is a 73 y.o. male with medical history significant for diastolic CHF with EF of 60 to 34%, anemia, asthma, history of E. coli and Klebsiella pneumoniae bacteremia, coronary artery disease, GERD, CKD3a, hypertension dyslipidemia, who presented to the ER with acute onset of fever that was up to 103.5 and chills with associated lethargy at his group home.  The patient has been having occasional cough with expectoration of clear sputum as well as wheezing and dyspnea.  He admitted to headache without dizziness or blurred vision.  No nausea or vomiting or abdominal pain.  No chest pain or palpitations.  No reported urinary symptoms however the patient was a fairly poor historian.   ED Course: When he came to the ER, shows 100.8 temp heart rate was 58 with BP of 100/58 and pulse 79 6% on room air.  Labs revealed positive UA for UTI.  BMP showed hyponatremia 130 and borderline potassium at 3.5, BUN of 41 and creatinine 2.16 above previous levels and VBG showed pH 7.41 and HCO3 25.4.  LFTs showed total protein 6.4 and AST 53.  Ammonia level was 78 compared to 91 on 9/20 and total bili 2.1.  Lactic acid was 1.8 and 1.6.  CBC showed mild leukocytosis of 11.5 with neutrophilia and anemia with hemoglobin 9.6 hematocrit 29.3 compared to 12.5 and 38.2 on 9/19, 10.7 and 33 on 05/01/2024.  INR was 1.5 and PT 18.7. EKG as reviewed by me : EKG showed atrial fibrillation with controlled rate response of 58 with nonspecific intraventricular conduction delay.. Imaging: Portable chest x-ray showed no acute cardiopulmonary disease.   The patient was given IV cefepime  and hydration with IV lactated ringer .  He will be admitted to a progressive unit bed for further evaluation and  management.  06/27/24 (+)bacteremia, remain on IV abx for now, Foley exchanged    Consultants:  none  Procedures/Surgeries: none      ASSESSMENT & PLAN:   Sepsis due to gram-negative UTI   Bacteremia  Sepsis is manifested by fever 100.8 and tachypnea of 22. IV Rocephin . IV lactated ringer  can dc  follow blood and urine cultures.   Hyponatremia - improved  Liver disease, renal disease, hypovolemia  Follow BMP   AKI on CKD3a Hydrating Monitor BMP  Acute on chronic anemia Iron deficiency Iron supplement Monitor CBC   Thrombocytopenia, chornic D/t liver disease  Monitor CBC  Acute hepatic encephalopathy  lactulose . rifaximin   Reduced dose Aldactone  w/ soft BP Delirium precautions  Monitor ammonia    Depression Celexa  and Seroquel .   GERD without esophagitis PPI   Peripheral neuropathy Neurontin .   Dyslipidemia statin     Class 1 obesity based on BMI: Body mass index is 33.45 kg/m.[name] Significantly low or high BMI is associated with higher medical risk.  Underweight - under 18  overweight - 25 to 29 obese - 30 or more Class 1 obesity: BMI of 30.0 to 34 Class 2 obesity: BMI of 35.0 to 39 Class 3 obesity: BMI of 40.0 to 49 Super Morbid Obesity: BMI 50-59 Super-super Morbid Obesity: BMI 60+ Healthy nutrition and physical activity advised as adjunct to other disease management and risk reduction treatments    DVT prophylaxis: SCD given low plt bleed risk  IV fluids: LR  continuous IV fluids w/ AKI Nutrition: cardiac diet  Central lines / other devices: none  Code Status: DNR ACP documentation reviewed: has DNR and MOST on file in [facility]  TOC needs: TBD - expect back to group home, possibly HH/SNF pending clinical course  Medical barriers to dispo: pending cultures, encephalopathy. Expected medical readiness for discharge 2-3 days.              Subjective / Brief ROS:  Patient reports feeling fine today no complaints Denies  CP/SOB.  Pain controlled.  Denies new weakness.  Tolerating diet.    Family Communication: none at this time     Objective Findings:  Vitals:   06/27/24 1000 06/27/24 1030 06/27/24 1109 06/27/24 1340  BP: (!) 99/51 (!) 113/51  127/80  Pulse: (!) 57   (!) 58  Resp: (!) 26 15  20   Temp:   97.9 F (36.6 C) (!) 100.4 F (38 C)  TempSrc:   Oral   SpO2: 96%   96%  Weight:      Height:        Intake/Output Summary (Last 24 hours) at 06/27/2024 1438 Last data filed at 06/27/2024 1142 Gross per 24 hour  Intake 1499 ml  Output --  Net 1499 ml   Filed Weights   06/26/24 2101  Weight: 94 kg    Examination:  Physical Exam Constitutional:      General: He is not in acute distress. Cardiovascular:     Rate and Rhythm: Normal rate and regular rhythm.  Pulmonary:     Effort: Pulmonary effort is normal.     Breath sounds: Normal breath sounds.  Neurological:     Mental Status: He is alert.          Scheduled Medications:   aspirin   81 mg Oral Daily   atorvastatin   40 mg Oral Daily   Chlorhexidine  Gluconate Cloth  6 each Topical Daily   citalopram   20 mg Oral Daily   feeding supplement  237 mL Oral TID BM   gabapentin   300 mg Oral BID   guaiFENesin   600 mg Oral BID   lactulose   45 g Oral TID   methocarbamol   500 mg Oral QID   multivitamin with minerals  1 tablet Oral Daily   nystatin  1 Application Topical TID   pantoprazole   40 mg Oral Daily   QUEtiapine   50 mg Oral QHS   rifaximin   550 mg Oral BID   spironolactone   12.5 mg Oral Daily    Continuous Infusions:  cefTRIAXone  (ROCEPHIN )  IV Stopped (06/27/24 1107)    PRN Medications:  acetaminophen  **OR** acetaminophen , albuterol , metoCLOPramide  (REGLAN ) injection, nitroGLYCERIN , phenol, senna, traZODone  Antimicrobials from admission:  Anti-infectives (From admission, onward)    Start     Dose/Rate Route Frequency Ordered Stop   06/27/24 1000  rifaximin  (XIFAXAN ) tablet 550 mg        550 mg Oral 2 times  daily 06/27/24 0150     06/27/24 1000  cefTRIAXone  (ROCEPHIN ) 2 g in sodium chloride  0.9 % 100 mL IVPB        2 g 200 mL/hr over 30 Minutes Intravenous Every 24 hours 06/27/24 0150 07/04/24 0959   06/26/24 2115  ceFEPIme  (MAXIPIME ) 1 g in sodium chloride  0.9 % 100 mL IVPB        1 g 200 mL/hr over 30 Minutes Intravenous  Once 06/26/24 2111 06/26/24 2229           Data Reviewed:  I have personally reviewed the following...  CBC: Recent Labs  Lab 06/26/24 2100 06/27/24 0523  WBC 11.5* 9.0  NEUTROABS 9.8*  --   HGB 9.6* 10.6*  HCT 29.3* 31.9*  MCV 89.1 87.4  PLT 61* 65*   Basic Metabolic Panel: Recent Labs  Lab 06/26/24 2100 06/26/24 2130 06/27/24 0523  NA 130*  --  134*  K 3.5  --  4.0  CL 100  --  104  CO2 22  --  24  GLUCOSE 118*  --  75  BUN 41*  --  38*  CREATININE 2.16*  --  2.17*  CALCIUM  8.8*  --  9.1  MG  --  1.7  --    GFR: Estimated Creatinine Clearance: 32.1 mL/min (A) (by C-G formula based on SCr of 2.17 mg/dL (H)). Liver Function Tests: Recent Labs  Lab 06/26/24 2100  AST 53*  ALT 28  ALKPHOS 72  BILITOT 2.1*  PROT 6.4*  ALBUMIN  2.1*   No results for input(s): LIPASE, AMYLASE in the last 168 hours. Recent Labs  Lab 06/26/24 2100  AMMONIA 78*   Coagulation Profile: Recent Labs  Lab 06/26/24 2100 06/27/24 0523  INR 1.5* 1.4*   Cardiac Enzymes: No results for input(s): CKTOTAL, CKMB, CKMBINDEX, TROPONINI in the last 168 hours. BNP (last 3 results) No results for input(s): PROBNP in the last 8760 hours. HbA1C: No results for input(s): HGBA1C in the last 72 hours. CBG: No results for input(s): GLUCAP in the last 168 hours. Lipid Profile: No results for input(s): CHOL, HDL, LDLCALC, TRIG, CHOLHDL, LDLDIRECT in the last 72 hours. Thyroid  Function Tests: No results for input(s): TSH, T4TOTAL, FREET4, T3FREE, THYROIDAB in the last 72 hours. Anemia Panel: Recent Labs    06/26/24 2100  06/27/24 0523  VITAMINB12 940*  --   FOLATE  --  >20.0  FERRITIN  --  91  TIBC  --  234*  IRON  --  19*  RETICCTPCT  --  1.4   Most Recent Urinalysis On File:     Component Value Date/Time   COLORURINE YELLOW (A) 06/27/2024 0008   APPEARANCEUR HAZY (A) 06/27/2024 0008   LABSPEC 1.018 06/27/2024 0008   PHURINE 7.0 06/27/2024 0008   GLUCOSEU NEGATIVE 06/27/2024 0008   HGBUR MODERATE (A) 06/27/2024 0008   BILIRUBINUR NEGATIVE 06/27/2024 0008   KETONESUR NEGATIVE 06/27/2024 0008   PROTEINUR 30 (A) 06/27/2024 0008   NITRITE NEGATIVE 06/27/2024 0008   LEUKOCYTESUR LARGE (A) 06/27/2024 0008   Sepsis Labs: @LABRCNTIP (procalcitonin:4,lacticidven:4) Microbiology: Recent Results (from the past 240 hours)  Blood culture (routine x 2)     Status: None (Preliminary result)   Collection Time: 06/26/24  9:27 PM   Specimen: BLOOD  Result Value Ref Range Status   Specimen Description BLOOD BLOOD LEFT ARM  Final   Special Requests   Final    BOTTLES DRAWN AEROBIC AND ANAEROBIC Blood Culture results may not be optimal due to an excessive volume of blood received in culture bottles   Culture  Setup Time   Final    GRAM NEGATIVE RODS IN BOTH AEROBIC AND ANAEROBIC BOTTLES CRITICAL VALUE NOTED.  VALUE IS CONSISTENT WITH PREVIOUSLY REPORTED AND CALLED VALUE. Performed at [facility], [address] [place], [place] [address]    Culture GRAM NEGATIVE RODS  Final   Report Status PENDING  Incomplete  Blood culture (routine x 2)     Status: None (Preliminary result)   Collection Time: 06/26/24  9:27 PM  Specimen: BLOOD  Result Value Ref Range Status   Specimen Description BLOOD BLOOD RIGHT ARM  Final   Special Requests   Final    BOTTLES DRAWN AEROBIC AND ANAEROBIC Blood Culture adequate volume   Culture  Setup Time   Final    Organism ID to follow GRAM NEGATIVE RODS IN BOTH AEROBIC AND ANAEROBIC BOTTLES CRITICAL RESULT CALLED TO, READ BACK BY AND VERIFIED WITH: ALEX CHAPPELL ON  06/27/24 AT 1129 RAM/QSD Performed at [facility], [address] [place], [place] [address]    Culture GRAM NEGATIVE RODS  Final   Report Status PENDING  Incomplete  Blood Culture ID Panel (Reflexed)     Status: Abnormal   Collection Time: 06/26/24  9:27 PM  Result Value Ref Range Status   Enterococcus faecalis NOT DETECTED NOT DETECTED Final   Enterococcus Faecium NOT DETECTED NOT DETECTED Final   Listeria monocytogenes NOT DETECTED NOT DETECTED Final   Staphylococcus species NOT DETECTED NOT DETECTED Final   Staphylococcus aureus (BCID) NOT DETECTED NOT DETECTED Final   Staphylococcus epidermidis NOT DETECTED NOT DETECTED Final   Staphylococcus lugdunensis NOT DETECTED NOT DETECTED Final   Streptococcus species NOT DETECTED NOT DETECTED Final   Streptococcus agalactiae NOT DETECTED NOT DETECTED Final   Streptococcus pneumoniae NOT DETECTED NOT DETECTED Final   Streptococcus pyogenes NOT DETECTED NOT DETECTED Final   A.calcoaceticus-baumannii NOT DETECTED NOT DETECTED Final   Bacteroides fragilis NOT DETECTED NOT DETECTED Final   Enterobacterales DETECTED (A) NOT DETECTED Final    Comment: Enterobacterales represent a large order of gram negative bacteria, not a single organism. CRITICAL RESULT CALLED TO, READ BACK BY AND VERIFIED WITH: ALEX CHAPPELL ON 06/27/24 AT 1129 RAM/QSD    Enterobacter cloacae complex NOT DETECTED NOT DETECTED Final   Escherichia coli NOT DETECTED NOT DETECTED Final   Klebsiella aerogenes NOT DETECTED NOT DETECTED Final   Klebsiella oxytoca DETECTED (A) NOT DETECTED Final    Comment: CRITICAL RESULT CALLED TO, READ BACK BY AND VERIFIED WITH: ALEX CHAPPELL ON 06/27/24 AT 1129 RAM/QSD    Klebsiella pneumoniae NOT DETECTED NOT DETECTED Final   Proteus species NOT DETECTED NOT DETECTED Final   Salmonella species NOT DETECTED NOT DETECTED Final   Serratia marcescens NOT DETECTED NOT DETECTED Final   Haemophilus influenzae NOT DETECTED NOT DETECTED  Final   Neisseria meningitidis NOT DETECTED NOT DETECTED Final   Pseudomonas aeruginosa NOT DETECTED NOT DETECTED Final   Stenotrophomonas maltophilia NOT DETECTED NOT DETECTED Final   Candida albicans NOT DETECTED NOT DETECTED Final   Candida auris NOT DETECTED NOT DETECTED Final   Candida glabrata NOT DETECTED NOT DETECTED Final   Candida krusei NOT DETECTED NOT DETECTED Final   Candida parapsilosis NOT DETECTED NOT DETECTED Final   Candida tropicalis NOT DETECTED NOT DETECTED Final   Cryptococcus neoformans/gattii NOT DETECTED NOT DETECTED Final   CTX-M ESBL NOT DETECTED NOT DETECTED Final   Carbapenem resistance IMP NOT DETECTED NOT DETECTED Final   Carbapenem resistance KPC NOT DETECTED NOT DETECTED Final   Carbapenem resistance NDM NOT DETECTED NOT DETECTED Final   Carbapenem resist OXA 48 LIKE NOT DETECTED NOT DETECTED Final   Carbapenem resistance VIM NOT DETECTED NOT DETECTED Final    Comment: Performed at [facility], [address] [place], [place] [address]  Resp panel by RT-PCR (RSV, Flu A&B, Covid) Anterior Nasal Swab     Status: None   Collection Time: 06/26/24 11:16 PM   Specimen: Anterior Nasal Swab  Result Value  Ref Range Status   SARS Coronavirus 2 by RT PCR NEGATIVE NEGATIVE Final    Comment: (NOTE) SARS-CoV-2 target nucleic acids are NOT DETECTED.  The SARS-CoV-2 RNA is generally detectable in upper respiratory specimens during the acute phase of infection. The lowest concentration of SARS-CoV-2 viral copies this assay can detect is 138 copies/mL. A negative result does not preclude SARS-Cov-2 infection and should not be used as the sole basis for treatment or other patient management decisions. A negative result may occur with  improper specimen collection/handling, submission of specimen other than nasopharyngeal swab, presence of viral mutation(s) within the areas targeted by this assay, and inadequate number of viral copies(<138 copies/mL). A  negative result must be combined with clinical observations, patient history, and epidemiological information. The expected result is Negative.  Fact Sheet for Patients:  bloggercourse.com  Fact Sheet for Healthcare Providers:  seriousbroker.it  This test is no t yet approved or cleared by the [place]  FDA and  has been authorized for detection and/or diagnosis of SARS-CoV-2 by FDA under an Emergency Use Authorization (EUA). This EUA will remain  in effect (meaning this test can be used) for the duration of the COVID-19 declaration under Section 564(b)(1) of the Act, 21 U.S.C.section 360bbb-3(b)(1), unless the authorization is terminated  or revoked sooner.       Influenza A by PCR NEGATIVE NEGATIVE Final   Influenza B by PCR NEGATIVE NEGATIVE Final    Comment: (NOTE) The Xpert Xpress SARS-CoV-2/FLU/RSV plus assay is intended as an aid in the diagnosis of influenza from Nasopharyngeal swab specimens and should not be used as a sole basis for treatment. Nasal washings and aspirates are unacceptable for Xpert Xpress SARS-CoV-2/FLU/RSV testing.  Fact Sheet for Patients: bloggercourse.com  Fact Sheet for Healthcare Providers: seriousbroker.it  This test is not yet approved or cleared by the [place]  FDA and has been authorized for detection and/or diagnosis of SARS-CoV-2 by FDA under an Emergency Use Authorization (EUA). This EUA will remain in effect (meaning this test can be used) for the duration of the COVID-19 declaration under Section 564(b)(1) of the Act, 21 U.S.C. section 360bbb-3(b)(1), unless the authorization is terminated or revoked.     Resp Syncytial Virus by PCR NEGATIVE NEGATIVE Final    Comment: (NOTE) Fact Sheet for Patients: bloggercourse.com  Fact Sheet for Healthcare  Providers: seriousbroker.it  This test is not yet approved or cleared by the [place]  FDA and has been authorized for detection and/or diagnosis of SARS-CoV-2 by FDA under an Emergency Use Authorization (EUA). This EUA will remain in effect (meaning this test can be used) for the duration of the COVID-19 declaration under Section 564(b)(1) of the Act, 21 U.S.C. section 360bbb-3(b)(1), unless the authorization is terminated or revoked.  Performed at [facility], [address] [place], [place] [address]       Radiology Studies last 3 days: DG Chest Portable 1 View Result Date: 06/26/2024 CLINICAL DATA:  Short of breath, confusion, sepsis EXAM: PORTABLE CHEST 1 VIEW COMPARISON:  02/17/2024 FINDINGS: Single frontal view of the chest demonstrates a stable cardiac silhouette. No acute airspace disease, effusion, or pneumothorax. No acute bony abnormalities. IMPRESSION: 1. No acute intrathoracic process. Electronically Signed   By: Ozell Daring M.D.   On: 06/26/2024 22:22       Gust Eugene, DO [facility] 06/27/2024, 2:38 PM    Dictation software may have been used to generate the above note. Typos may occur and escape review in typed/dictated notes. Please  contact Dr Marsa directly for clarity if needed.  Staff may message me via secure chat in Epic  but this may not receive an immediate response,  please page me for urgent matters!  If 7PM-7AM, please contact night coverage [email or web address]

## 2024-06-27 NOTE — ED Notes (Signed)
 Room cleaned,  pt placed in hospital gown.

## 2024-06-27 NOTE — Assessment & Plan Note (Signed)
-   Will continue PPI therapy.

## 2024-06-27 NOTE — Assessment & Plan Note (Signed)
-   Will obtain anemia workup. - Will monitor H&H.

## 2024-06-28 DIAGNOSIS — A4150 Gram-negative sepsis, unspecified: Secondary | ICD-10-CM | POA: Diagnosis not present

## 2024-06-28 DIAGNOSIS — N390 Urinary tract infection, site not specified: Secondary | ICD-10-CM | POA: Diagnosis not present

## 2024-06-28 LAB — CBC
HCT: 28.6 % — ABNORMAL LOW (ref 39.0–52.0)
Hemoglobin: 9.4 g/dL — ABNORMAL LOW (ref 13.0–17.0)
MCH: 29.2 pg (ref 26.0–34.0)
MCHC: 32.9 g/dL (ref 30.0–36.0)
MCV: 88.8 fL (ref 80.0–100.0)
Platelets: 59 K/uL — ABNORMAL LOW (ref 150–400)
RBC: 3.22 MIL/uL — ABNORMAL LOW (ref 4.22–5.81)
RDW: 17.3 % — ABNORMAL HIGH (ref 11.5–15.5)
WBC: 5.8 K/uL (ref 4.0–10.5)
nRBC: 0 % (ref 0.0–0.2)

## 2024-06-28 LAB — BASIC METABOLIC PANEL WITH GFR
Anion gap: 11 (ref 5–15)
BUN: 35 mg/dL — ABNORMAL HIGH (ref 8–23)
CO2: 22 mmol/L (ref 22–32)
Calcium: 9.7 mg/dL (ref 8.9–10.3)
Chloride: 104 mmol/L (ref 98–111)
Creatinine, Ser: 1.91 mg/dL — ABNORMAL HIGH (ref 0.61–1.24)
GFR, Estimated: 36 mL/min — ABNORMAL LOW (ref >60)
Glucose, Bld: 113 mg/dL — ABNORMAL HIGH (ref 70–99)
Potassium: 4.3 mmol/L (ref 3.5–5.1)
Sodium: 137 mmol/L (ref 135–145)

## 2024-06-28 LAB — AMMONIA: Ammonia: 70 umol/L — ABNORMAL HIGH (ref 9–35)

## 2024-06-28 NOTE — Progress Notes (Incomplete)
 PROGRESS NOTE    [name]   FMW:[id number] DOB: [birth year]  DOA: 06/26/2024 Date of Service: 06/28/24 which is hospital day 2  PCP: [facility], [facility] course / significant events:   [name] is a 73 y.o. male with medical history significant for diastolic CHF with EF of 60 to 34%, anemia, asthma, history of E. coli and Klebsiella pneumoniae bacteremia, cirrhosis, coronary artery disease, GERD, CKD3a, hypertension dyslipidemia, who presented to the ER with acute onset of fever that was up to 103.5 and chills with associated lethargy at his group home.   10/31: admitted for sepsis / UTI, AKI, hyponatremia. Started on fluids, cefepime .  11/01: (+)bacteremia, remain on IV abx for now, Foley exchanged     Consultants:  none  Procedures/Surgeries: none      ASSESSMENT & PLAN:   Sepsis due to gram-negative UTI   Bacteremia Enterobacter and Klebsiella Sepsis is manifested by fever 100.8 and tachypnea of 22. IV Rocephin . follow blood and urine cultures.   Hyponatremia - resolved   Liver disease, renal disease, hypovolemia  Follow BMP   AKI on CKD3a - improving  Hydrating - encourage po intake  Monitor BMP Avoid NSAID   Acute on chronic anemia Iron deficiency Iron supplement Monitor CBC   Thrombocytopenia, chornic D/t liver disease  Monitor CBC  Cirrhosis  Acute hepatic encephalopathy - improved  lactulose . rifaximin   Reduced dose Aldactone  w/ soft BP Delirium precautions  Monitor ammonia and adjust lactulose  as needed    Depression Celexa  and Seroquel .   GERD without esophagitis PPI   Peripheral neuropathy Neurontin .   Dyslipidemia statin     Class 1 obesity based on BMI: Body mass index is 33.45 kg/m.[name] Significantly low or high BMI is associated with higher medical risk.  Underweight - under 18  overweight - 25 to 29 obese - 30 or more Class 1 obesity: BMI of 30.0 to 34 Class 2 obesity: BMI of 35.0 to  39 Class 3 obesity: BMI of 40.0 to 49 Super Morbid Obesity: BMI 50-59 Super-super Morbid Obesity: BMI 60+ Healthy nutrition and physical activity advised as adjunct to other disease management and risk reduction treatments    DVT prophylaxis: SCD given low plt bleed risk  IV fluids: LR continuous IV fluids w/ AKI Nutrition: cardiac diet  Central lines / other devices: none  Code Status: DNR ACP documentation reviewed: has DNR and MOST on file in [facility]  TOC needs: TBD - expect back to group home, possibly HH/SNF pending clinical course  Medical barriers to dispo: pending cultures, encephalopathy. Expected medical readiness for discharge 2-3 days.              Subjective / Brief ROS:  Patient reports feeling fine today no complaints Denies CP/SOB.  Pain controlled.  Denies new weakness.  Tolerating diet.    Family Communication: none at this time     Objective Findings:  Vitals:   06/27/24 1935 06/28/24 0020 06/28/24 0414 06/28/24 0731  BP: (!) 111/58 127/66 132/68 131/64  Pulse: (!) 55 (!) 54 (!) 56 (!) 56  Resp: 17 15 17    Temp: 99.9 F (37.7 C) 99.5 F (37.5 C) 100.3 F (37.9 C) 98.9 F (37.2 C)  TempSrc:      SpO2: 97% 97% 100% 98%  Weight:      Height:        Intake/Output Summary (Last 24 hours) at 06/28/2024 0758 Last data filed at 06/28/2024 9388 Gross per 24  hour  Intake 1052.33 ml  Output 800 ml  Net 252.33 ml   Filed Weights   06/26/24 2101  Weight: 94 kg    Examination:  Physical Exam Constitutional:      General: He is not in acute distress. Cardiovascular:     Rate and Rhythm: Normal rate and regular rhythm.  Pulmonary:     Effort: Pulmonary effort is normal.     Breath sounds: Normal breath sounds.  Neurological:     Mental Status: He is alert.          Scheduled Medications:   aspirin   81 mg Oral Daily   atorvastatin   40 mg Oral Daily   Chlorhexidine  Gluconate Cloth  6 each Topical Daily   citalopram   20 mg  Oral Daily   feeding supplement  237 mL Oral TID BM   gabapentin   300 mg Oral BID   guaiFENesin   600 mg Oral BID   lactulose   45 g Oral TID   methocarbamol   500 mg Oral QID   multivitamin with minerals  1 tablet Oral Daily   nystatin  1 Application Topical TID   pantoprazole   40 mg Oral Daily   QUEtiapine   50 mg Oral QHS   rifaximin   550 mg Oral BID   spironolactone   12.5 mg Oral Daily    Continuous Infusions:  cefTRIAXone  (ROCEPHIN )  IV Stopped (06/27/24 1107)    PRN Medications:  acetaminophen  **OR** acetaminophen , albuterol , metoCLOPramide  (REGLAN ) injection, nitroGLYCERIN , phenol, senna, traZODone  Antimicrobials from admission:  Anti-infectives (From admission, onward)    Start     Dose/Rate Route Frequency Ordered Stop   06/27/24 1000  rifaximin  (XIFAXAN ) tablet 550 mg        550 mg Oral 2 times daily 06/27/24 0150     06/27/24 1000  cefTRIAXone  (ROCEPHIN ) 2 g in sodium chloride  0.9 % 100 mL IVPB        2 g 200 mL/hr over 30 Minutes Intravenous Every 24 hours 06/27/24 0150 07/04/24 0959   06/26/24 2115  ceFEPIme  (MAXIPIME ) 1 g in sodium chloride  0.9 % 100 mL IVPB        1 g 200 mL/hr over 30 Minutes Intravenous  Once 06/26/24 2111 06/26/24 2229           Data Reviewed:  I have personally reviewed the following...  CBC: Recent Labs  Lab 06/26/24 2100 06/27/24 0523 06/28/24 0604  WBC 11.5* 9.0 5.8  NEUTROABS 9.8*  --   --   HGB 9.6* 10.6* 9.4*  HCT 29.3* 31.9* 28.6*  MCV 89.1 87.4 88.8  PLT 61* 65* 59*   Basic Metabolic Panel: Recent Labs  Lab 06/26/24 2100 06/26/24 2130 06/27/24 0523 06/28/24 0604  NA 130*  --  134* 137  K 3.5  --  4.0 4.3  CL 100  --  104 104  CO2 22  --  24 22  GLUCOSE 118*  --  75 113*  BUN 41*  --  38* 35*  CREATININE 2.16*  --  2.17* 1.91*  CALCIUM  8.8*  --  9.1 9.7  MG  --  1.7  --   --    GFR: Estimated Creatinine Clearance: 36.4 mL/min (A) (by C-G formula based on SCr of 1.91 mg/dL (H)). Liver Function  Tests: Recent Labs  Lab 06/26/24 2100  AST 53*  ALT 28  ALKPHOS 72  BILITOT 2.1*  PROT 6.4*  ALBUMIN  2.1*   No results for input(s): LIPASE, AMYLASE in the last 168  hours. Recent Labs  Lab 06/26/24 2100 06/28/24 0604  AMMONIA 78* 70*   Coagulation Profile: Recent Labs  Lab 06/26/24 2100 06/27/24 0523  INR 1.5* 1.4*   Cardiac Enzymes: No results for input(s): CKTOTAL, CKMB, CKMBINDEX, TROPONINI in the last 168 hours. BNP (last 3 results) No results for input(s): PROBNP in the last 8760 hours. HbA1C: No results for input(s): HGBA1C in the last 72 hours. CBG: No results for input(s): GLUCAP in the last 168 hours. Lipid Profile: No results for input(s): CHOL, HDL, LDLCALC, TRIG, CHOLHDL, LDLDIRECT in the last 72 hours. Thyroid  Function Tests: No results for input(s): TSH, T4TOTAL, FREET4, T3FREE, THYROIDAB in the last 72 hours. Anemia Panel: Recent Labs    06/26/24 2100 06/27/24 0523  VITAMINB12 940*  --   FOLATE  --  >20.0  FERRITIN  --  91  TIBC  --  234*  IRON  --  19*  RETICCTPCT  --  1.4   Most Recent Urinalysis On File:     Component Value Date/Time   COLORURINE YELLOW (A) 06/27/2024 0008   APPEARANCEUR HAZY (A) 06/27/2024 0008   LABSPEC 1.018 06/27/2024 0008   PHURINE 7.0 06/27/2024 0008   GLUCOSEU NEGATIVE 06/27/2024 0008   HGBUR MODERATE (A) 06/27/2024 0008   BILIRUBINUR NEGATIVE 06/27/2024 0008   KETONESUR NEGATIVE 06/27/2024 0008   PROTEINUR 30 (A) 06/27/2024 0008   NITRITE NEGATIVE 06/27/2024 0008   LEUKOCYTESUR LARGE (A) 06/27/2024 0008   Sepsis Labs: @LABRCNTIP (procalcitonin:4,lacticidven:4) Microbiology: Recent Results (from the past 240 hours)  Blood culture (routine x 2)     Status: None (Preliminary result)   Collection Time: 06/26/24  9:27 PM   Specimen: BLOOD  Result Value Ref Range Status   Specimen Description BLOOD BLOOD LEFT ARM  Final   Special Requests   Final    BOTTLES DRAWN  AEROBIC AND ANAEROBIC Blood Culture results may not be optimal due to an excessive volume of blood received in culture bottles   Culture  Setup Time   Final    GRAM NEGATIVE RODS IN BOTH AEROBIC AND ANAEROBIC BOTTLES CRITICAL VALUE NOTED.  VALUE IS CONSISTENT WITH PREVIOUSLY REPORTED AND CALLED VALUE. Performed at [facility], [address] [place], [place] [address]    Culture GRAM NEGATIVE RODS  Final   Report Status PENDING  Incomplete  Blood culture (routine x 2)     Status: None (Preliminary result)   Collection Time: 06/26/24  9:27 PM   Specimen: BLOOD  Result Value Ref Range Status   Specimen Description   Final    BLOOD BLOOD RIGHT ARM Performed at [facility], [address] [place], [place] [address]    Special Requests   Final    BOTTLES DRAWN AEROBIC AND ANAEROBIC Blood Culture adequate volume Performed at [facility], [address] [place], [place] [address]    Culture  Setup Time   Final    GRAM NEGATIVE RODS IN BOTH AEROBIC AND ANAEROBIC BOTTLES CRITICAL RESULT CALLED TO, READ BACK BY AND VERIFIED WITH: ALEX CHAPPELL ON 06/27/24 AT 1129 RAM/QSD Performed at [facility], [address] [place], [place] [address]    Culture GRAM NEGATIVE RODS  Final   Report Status PENDING  Incomplete  Blood Culture ID Panel (Reflexed)     Status: Abnormal   Collection Time: 06/26/24  9:27 PM  Result Value Ref Range Status   Enterococcus faecalis NOT DETECTED NOT DETECTED Final   Enterococcus Faecium NOT DETECTED NOT DETECTED  Final   Listeria monocytogenes NOT DETECTED NOT DETECTED Final   Staphylococcus species NOT DETECTED NOT DETECTED Final   Staphylococcus aureus (BCID) NOT DETECTED NOT DETECTED Final   Staphylococcus epidermidis NOT DETECTED NOT DETECTED Final   Staphylococcus lugdunensis NOT DETECTED NOT DETECTED Final   Streptococcus species NOT DETECTED NOT DETECTED Final   Streptococcus agalactiae NOT DETECTED NOT DETECTED  Final   Streptococcus pneumoniae NOT DETECTED NOT DETECTED Final   Streptococcus pyogenes NOT DETECTED NOT DETECTED Final   A.calcoaceticus-baumannii NOT DETECTED NOT DETECTED Final   Bacteroides fragilis NOT DETECTED NOT DETECTED Final   Enterobacterales DETECTED (A) NOT DETECTED Final    Comment: Enterobacterales represent a large order of gram negative bacteria, not a single organism. CRITICAL RESULT CALLED TO, READ BACK BY AND VERIFIED WITH: ALEX CHAPPELL ON 06/27/24 AT 1129 RAM/QSD    Enterobacter cloacae complex NOT DETECTED NOT DETECTED Final   Escherichia coli NOT DETECTED NOT DETECTED Final   Klebsiella aerogenes NOT DETECTED NOT DETECTED Final   Klebsiella oxytoca DETECTED (A) NOT DETECTED Final    Comment: CRITICAL RESULT CALLED TO, READ BACK BY AND VERIFIED WITH: ALEX CHAPPELL ON 06/27/24 AT 1129 RAM/QSD    Klebsiella pneumoniae NOT DETECTED NOT DETECTED Final   Proteus species NOT DETECTED NOT DETECTED Final   Salmonella species NOT DETECTED NOT DETECTED Final   Serratia marcescens NOT DETECTED NOT DETECTED Final   Haemophilus influenzae NOT DETECTED NOT DETECTED Final   Neisseria meningitidis NOT DETECTED NOT DETECTED Final   Pseudomonas aeruginosa NOT DETECTED NOT DETECTED Final   Stenotrophomonas maltophilia NOT DETECTED NOT DETECTED Final   Candida albicans NOT DETECTED NOT DETECTED Final   Candida auris NOT DETECTED NOT DETECTED Final   Candida glabrata NOT DETECTED NOT DETECTED Final   Candida krusei NOT DETECTED NOT DETECTED Final   Candida parapsilosis NOT DETECTED NOT DETECTED Final   Candida tropicalis NOT DETECTED NOT DETECTED Final   Cryptococcus neoformans/gattii NOT DETECTED NOT DETECTED Final   CTX-M ESBL NOT DETECTED NOT DETECTED Final   Carbapenem resistance IMP NOT DETECTED NOT DETECTED Final   Carbapenem resistance KPC NOT DETECTED NOT DETECTED Final   Carbapenem resistance NDM NOT DETECTED NOT DETECTED Final   Carbapenem resist OXA 48 LIKE NOT  DETECTED NOT DETECTED Final   Carbapenem resistance VIM NOT DETECTED NOT DETECTED Final    Comment: Performed at [facility], [address] [place], [place] [address]  Resp panel by RT-PCR (RSV, Flu A&B, Covid) Anterior Nasal Swab     Status: None   Collection Time: 06/26/24 11:16 PM   Specimen: Anterior Nasal Swab  Result Value Ref Range Status   SARS Coronavirus 2 by RT PCR NEGATIVE NEGATIVE Final    Comment: (NOTE) SARS-CoV-2 target nucleic acids are NOT DETECTED.  The SARS-CoV-2 RNA is generally detectable in upper respiratory specimens during the acute phase of infection. The lowest concentration of SARS-CoV-2 viral copies this assay can detect is 138 copies/mL. A negative result does not preclude SARS-Cov-2 infection and should not be used as the sole basis for treatment or other patient management decisions. A negative result may occur with  improper specimen collection/handling, submission of specimen other than nasopharyngeal swab, presence of viral mutation(s) within the areas targeted by this assay, and inadequate number of viral copies(<138 copies/mL). A negative result must be combined with clinical observations, patient history, and epidemiological information. The expected result is Negative.  Fact Sheet for Patients:  bloggercourse.com  Fact Sheet for Healthcare Providers:  seriousbroker.it  This test is no t yet approved or cleared by the [place]  FDA and  has been authorized for detection and/or diagnosis of SARS-CoV-2 by FDA under an Emergency Use Authorization (EUA). This EUA will remain  in effect (meaning this test can be used) for the duration of the COVID-19 declaration under Section 564(b)(1) of the Act, 21 U.S.C.section 360bbb-3(b)(1), unless the authorization is terminated  or revoked sooner.       Influenza A by PCR NEGATIVE NEGATIVE Final   Influenza B by PCR NEGATIVE NEGATIVE  Final    Comment: (NOTE) The Xpert Xpress SARS-CoV-2/FLU/RSV plus assay is intended as an aid in the diagnosis of influenza from Nasopharyngeal swab specimens and should not be used as a sole basis for treatment. Nasal washings and aspirates are unacceptable for Xpert Xpress SARS-CoV-2/FLU/RSV testing.  Fact Sheet for Patients: bloggercourse.com  Fact Sheet for Healthcare Providers: seriousbroker.it  This test is not yet approved or cleared by the [place]  FDA and has been authorized for detection and/or diagnosis of SARS-CoV-2 by FDA under an Emergency Use Authorization (EUA). This EUA will remain in effect (meaning this test can be used) for the duration of the COVID-19 declaration under Section 564(b)(1) of the Act, 21 U.S.C. section 360bbb-3(b)(1), unless the authorization is terminated or revoked.     Resp Syncytial Virus by PCR NEGATIVE NEGATIVE Final    Comment: (NOTE) Fact Sheet for Patients: bloggercourse.com  Fact Sheet for Healthcare Providers: seriousbroker.it  This test is not yet approved or cleared by the [place]  FDA and has been authorized for detection and/or diagnosis of SARS-CoV-2 by FDA under an Emergency Use Authorization (EUA). This EUA will remain in effect (meaning this test can be used) for the duration of the COVID-19 declaration under Section 564(b)(1) of the Act, 21 U.S.C. section 360bbb-3(b)(1), unless the authorization is terminated or revoked.  Performed at [facility], [address] [place], [place] [address]       Radiology Studies last 3 days: DG Chest Portable 1 View Result Date: 06/26/2024 CLINICAL DATA:  Short of breath, confusion, sepsis EXAM: PORTABLE CHEST 1 VIEW COMPARISON:  02/17/2024 FINDINGS: Single frontal view of the chest demonstrates a stable cardiac silhouette. No acute airspace disease, effusion, or  pneumothorax. No acute bony abnormalities. IMPRESSION: 1. No acute intrathoracic process. Electronically Signed   By: Ozell Daring M.D.   On: 06/26/2024 22:22       Karita Dralle, DO [facility] 06/28/2024, 7:58 AM    Dictation software may have been used to generate the above note. Typos may occur and escape review in typed/dictated notes. Please contact Dr Marsa directly for clarity if needed.  Staff may message me via secure chat in Epic  but this may not receive an immediate response,  please page me for urgent matters!  If 7PM-7AM, please contact night coverage [email or web address]

## 2024-06-28 NOTE — Progress Notes (Signed)
 PROGRESS NOTE    RALPHIE LOVELADY   FMW:[id number] DOB: 17-Apr-1951  DOA: 06/26/2024 Date of Service: 06/28/24 which is hospital day 2  PCP: [facility], [facility] course / significant events:   NAYSHAWN MESTA is a 73 y.o. male with medical history significant for diastolic CHF with EF of 60 to 34%, anemia, asthma, history of E. coli and Klebsiella pneumoniae bacteremia, cirrhosis, coronary artery disease, GERD, CKD3a, hypertension dyslipidemia, who presented to the ER with acute onset of fever that was up to 103.5 and chills with associated lethargy at his group home.   10/31: admitted for sepsis / UTI, AKI, hyponatremia. Started on fluids, cefepime .  11/01: (+)bacteremia, remain on IV abx for now, Foley exchanged  11/02: pending susceptibilities, remain on IV abx plan repeat BCx tomorrow / follow w/ ID     Consultants:  none  Procedures/Surgeries: none      ASSESSMENT & PLAN:   Sepsis due to gram-negative UTI   Bacteremia Enterobacter and Klebsiella Sepsis is manifested by fever 100.8 and tachypnea of 22. IV Rocephin . pending susceptibilities plan repeat BCx tomorrow Will follow w/ ID    Hyponatremia - resolved   Liver disease, renal disease, hypovolemia  Follow BMP   AKI on CKD3a - improving  Hydrating - encourage po intake  Monitor BMP Avoid NSAID   Acute on chronic anemia Iron deficiency Iron supplement Monitor CBC   Thrombocytopenia, chornic D/t liver disease  Monitor CBC  Cirrhosis  Acute hepatic encephalopathy - improved  lactulose . rifaximin   Reduced dose Aldactone  w/ soft BP Delirium precautions  Monitor ammonia and adjust lactulose  as needed    Depression Celexa  and Seroquel .   GERD without esophagitis PPI   Peripheral neuropathy Neurontin .   Dyslipidemia statin     Class 1 obesity based on BMI: Body mass index is 33.45 kg/m.[name] Significantly low or high BMI is associated with higher medical risk.   Underweight - under 18  overweight - 25 to 29 obese - 30 or more Class 1 obesity: BMI of 30.0 to 34 Class 2 obesity: BMI of 35.0 to 39 Class 3 obesity: BMI of 40.0 to 49 Super Morbid Obesity: BMI 50-59 Super-super Morbid Obesity: BMI 60+ Healthy nutrition and physical activity advised as adjunct to other disease management and risk reduction treatments    DVT prophylaxis: SCD given low plt bleed risk  IV fluids: LR continuous IV fluids w/ AKI Nutrition: cardiac diet  Central lines / other devices: none  Code Status: DNR ACP documentation reviewed: has DNR and MOST on file in [facility]  TOC needs: TBD - expect back to group home, possibly HH/SNF pending clinical course  Medical barriers to dispo: pending cultures, encephalopathy. Expected medical readiness for discharge 2-3 days.              Subjective / Brief ROS:  Patient reports feeling fine today no complaints other than he doesn't like his lunch Denies CP/SOB.  Pain controlled.  Denies new weakness.  Tolerating diet.    Family Communication: none at this time     Objective Findings:  Vitals:   06/27/24 1935 06/28/24 0020 06/28/24 0414 06/28/24 0731  BP: (!) 111/58 127/66 132/68 131/64  Pulse: (!) 55 (!) 54 (!) 56 (!) 56  Resp: 17 15 17    Temp: 99.9 F (37.7 C) 99.5 F (37.5 C) 100.3 F (37.9 C) 98.9 F (37.2 C)  TempSrc:      SpO2: 97% 97% 100% 98%  Weight:  Height:        Intake/Output Summary (Last 24 hours) at 06/28/2024 1400 Last data filed at 06/28/2024 9388 Gross per 24 hour  Intake 53.33 ml  Output 800 ml  Net -746.67 ml   Filed Weights   06/26/24 2101  Weight: 94 kg    Examination:  Physical Exam Constitutional:      General: He is not in acute distress. Cardiovascular:     Rate and Rhythm: Normal rate and regular rhythm.  Pulmonary:     Effort: Pulmonary effort is normal.     Breath sounds: Normal breath sounds.  Neurological:     Mental Status: He is alert.           Scheduled Medications:   aspirin   81 mg Oral Daily   atorvastatin   40 mg Oral Daily   Chlorhexidine  Gluconate Cloth  6 each Topical Daily   citalopram   20 mg Oral Daily   feeding supplement  237 mL Oral TID BM   gabapentin   300 mg Oral BID   guaiFENesin   600 mg Oral BID   lactulose   45 g Oral TID   methocarbamol   500 mg Oral QID   multivitamin with minerals  1 tablet Oral Daily   nystatin  1 Application Topical TID   pantoprazole   40 mg Oral Daily   QUEtiapine   50 mg Oral QHS   rifaximin   550 mg Oral BID   spironolactone   12.5 mg Oral Daily    Continuous Infusions:  cefTRIAXone  (ROCEPHIN )  IV 2 g (06/28/24 0943)    PRN Medications:  acetaminophen  **OR** acetaminophen , albuterol , metoCLOPramide  (REGLAN ) injection, nitroGLYCERIN , phenol, senna, traZODone  Antimicrobials from admission:  Anti-infectives (From admission, onward)    Start     Dose/Rate Route Frequency Ordered Stop   06/27/24 1000  rifaximin  (XIFAXAN ) tablet 550 mg        550 mg Oral 2 times daily 06/27/24 0150     06/27/24 1000  cefTRIAXone  (ROCEPHIN ) 2 g in sodium chloride  0.9 % 100 mL IVPB        2 g 200 mL/hr over 30 Minutes Intravenous Every 24 hours 06/27/24 0150 07/04/24 0959   06/26/24 2115  ceFEPIme  (MAXIPIME ) 1 g in sodium chloride  0.9 % 100 mL IVPB        1 g 200 mL/hr over 30 Minutes Intravenous  Once 06/26/24 2111 06/26/24 2229           Data Reviewed:  I have personally reviewed the following...  CBC: Recent Labs  Lab 06/26/24 2100 06/27/24 0523 06/28/24 0604  WBC 11.5* 9.0 5.8  NEUTROABS 9.8*  --   --   HGB 9.6* 10.6* 9.4*  HCT 29.3* 31.9* 28.6*  MCV 89.1 87.4 88.8  PLT 61* 65* 59*   Basic Metabolic Panel: Recent Labs  Lab 06/26/24 2100 06/26/24 2130 06/27/24 0523 06/28/24 0604  NA 130*  --  134* 137  K 3.5  --  4.0 4.3  CL 100  --  104 104  CO2 22  --  24 22  GLUCOSE 118*  --  75 113*  BUN 41*  --  38* 35*  CREATININE 2.16*  --  2.17* 1.91*   CALCIUM  8.8*  --  9.1 9.7  MG  --  1.7  --   --    GFR: Estimated Creatinine Clearance: 36.4 mL/min (A) (by C-G formula based on SCr of 1.91 mg/dL (H)). Liver Function Tests: Recent Labs  Lab 06/26/24 2100  AST 53*  ALT 28  ALKPHOS 72  BILITOT 2.1*  PROT 6.4*  ALBUMIN  2.1*   No results for input(s): LIPASE, AMYLASE in the last 168 hours. Recent Labs  Lab 06/26/24 2100 06/28/24 0604  AMMONIA 78* 70*   Coagulation Profile: Recent Labs  Lab 06/26/24 2100 06/27/24 0523  INR 1.5* 1.4*   Cardiac Enzymes: No results for input(s): CKTOTAL, CKMB, CKMBINDEX, TROPONINI in the last 168 hours. BNP (last 3 results) No results for input(s): PROBNP in the last 8760 hours. HbA1C: No results for input(s): HGBA1C in the last 72 hours. CBG: No results for input(s): GLUCAP in the last 168 hours. Lipid Profile: No results for input(s): CHOL, HDL, LDLCALC, TRIG, CHOLHDL, LDLDIRECT in the last 72 hours. Thyroid  Function Tests: No results for input(s): TSH, T4TOTAL, FREET4, T3FREE, THYROIDAB in the last 72 hours. Anemia Panel: Recent Labs    06/26/24 2100 06/27/24 0523  VITAMINB12 940*  --   FOLATE  --  >20.0  FERRITIN  --  91  TIBC  --  234*  IRON  --  19*  RETICCTPCT  --  1.4   Most Recent Urinalysis On File:     Component Value Date/Time   COLORURINE YELLOW (A) 06/27/2024 0008   APPEARANCEUR HAZY (A) 06/27/2024 0008   LABSPEC 1.018 06/27/2024 0008   PHURINE 7.0 06/27/2024 0008   GLUCOSEU NEGATIVE 06/27/2024 0008   HGBUR MODERATE (A) 06/27/2024 0008   BILIRUBINUR NEGATIVE 06/27/2024 0008   KETONESUR NEGATIVE 06/27/2024 0008   PROTEINUR 30 (A) 06/27/2024 0008   NITRITE NEGATIVE 06/27/2024 0008   LEUKOCYTESUR LARGE (A) 06/27/2024 0008   Sepsis Labs: @LABRCNTIP (procalcitonin:4,lacticidven:4) Microbiology: Recent Results (from the past 240 hours)  Blood culture (routine x 2)     Status: None (Preliminary result)   Collection  Time: 06/26/24  9:27 PM   Specimen: BLOOD  Result Value Ref Range Status   Specimen Description   Final    BLOOD BLOOD LEFT ARM Performed at [facility], [address], [place], [place] [address]    Special Requests   Final    BOTTLES DRAWN AEROBIC AND ANAEROBIC Blood Culture results may not be optimal due to an excessive volume of blood received in culture bottles Performed at [facility], [address] [place], [place] [address]    Culture  Setup Time   Final    GRAM NEGATIVE RODS IN BOTH AEROBIC AND ANAEROBIC BOTTLES CRITICAL VALUE NOTED.  VALUE IS CONSISTENT WITH PREVIOUSLY REPORTED AND CALLED VALUE. Performed at [facility], [address] [place], [place] [address]    Culture   Final    VONNE NEGATIVE RODS IDENTIFICATION TO FOLLOW Performed at [facility], [address], [place], [place] [address]    Report Status PENDING  Incomplete  Blood culture (routine x 2)     Status: Abnormal (Preliminary result)   Collection Time: 06/26/24  9:27 PM   Specimen: BLOOD  Result Value Ref Range Status   Specimen Description   Final    BLOOD BLOOD RIGHT ARM Performed at [facility], [address] [place], [place] [address]    Special Requests   Final    BOTTLES DRAWN AEROBIC AND ANAEROBIC Blood Culture adequate volume Performed at [facility], [address] [place], [place] [address]    Culture  Setup Time   Final    GRAM NEGATIVE RODS IN BOTH AEROBIC AND ANAEROBIC BOTTLES CRITICAL RESULT CALLED TO, READ BACK BY AND VERIFIED WITH: ALEX CHAPPELL  ON 06/27/24 AT 1129 RAM/QSD    Culture (A)  Final    KLEBSIELLA OXYTOCA SUSCEPTIBILITIES TO FOLLOW Performed at [facility], [address] [place], [place] [address]    Report Status PENDING  Incomplete  Blood Culture ID Panel (Reflexed)     Status: Abnormal   Collection Time: 06/26/24  9:27 PM  Result Value Ref Range Status   Enterococcus faecalis NOT  DETECTED NOT DETECTED Final   Enterococcus Faecium NOT DETECTED NOT DETECTED Final   Listeria monocytogenes NOT DETECTED NOT DETECTED Final   Staphylococcus species NOT DETECTED NOT DETECTED Final   Staphylococcus aureus (BCID) NOT DETECTED NOT DETECTED Final   Staphylococcus epidermidis NOT DETECTED NOT DETECTED Final   Staphylococcus lugdunensis NOT DETECTED NOT DETECTED Final   Streptococcus species NOT DETECTED NOT DETECTED Final   Streptococcus agalactiae NOT DETECTED NOT DETECTED Final   Streptococcus pneumoniae NOT DETECTED NOT DETECTED Final   Streptococcus pyogenes NOT DETECTED NOT DETECTED Final   A.calcoaceticus-baumannii NOT DETECTED NOT DETECTED Final   Bacteroides fragilis NOT DETECTED NOT DETECTED Final   Enterobacterales DETECTED (A) NOT DETECTED Final    Comment: Enterobacterales represent a large order of gram negative bacteria, not a single organism. CRITICAL RESULT CALLED TO, READ BACK BY AND VERIFIED WITH: ALEX CHAPPELL ON 06/27/24 AT 1129 RAM/QSD    Enterobacter cloacae complex NOT DETECTED NOT DETECTED Final   Escherichia coli NOT DETECTED NOT DETECTED Final   Klebsiella aerogenes NOT DETECTED NOT DETECTED Final   Klebsiella oxytoca DETECTED (A) NOT DETECTED Final    Comment: CRITICAL RESULT CALLED TO, READ BACK BY AND VERIFIED WITH: ALEX CHAPPELL ON 06/27/24 AT 1129 RAM/QSD    Klebsiella pneumoniae NOT DETECTED NOT DETECTED Final   Proteus species NOT DETECTED NOT DETECTED Final   Salmonella species NOT DETECTED NOT DETECTED Final   Serratia marcescens NOT DETECTED NOT DETECTED Final   Haemophilus influenzae NOT DETECTED NOT DETECTED Final   Neisseria meningitidis NOT DETECTED NOT DETECTED Final   Pseudomonas aeruginosa NOT DETECTED NOT DETECTED Final   Stenotrophomonas maltophilia NOT DETECTED NOT DETECTED Final   Candida albicans NOT DETECTED NOT DETECTED Final   Candida auris NOT DETECTED NOT DETECTED Final   Candida glabrata NOT DETECTED NOT DETECTED Final    Candida krusei NOT DETECTED NOT DETECTED Final   Candida parapsilosis NOT DETECTED NOT DETECTED Final   Candida tropicalis NOT DETECTED NOT DETECTED Final   Cryptococcus neoformans/gattii NOT DETECTED NOT DETECTED Final   CTX-M ESBL NOT DETECTED NOT DETECTED Final   Carbapenem resistance IMP NOT DETECTED NOT DETECTED Final   Carbapenem resistance KPC NOT DETECTED NOT DETECTED Final   Carbapenem resistance NDM NOT DETECTED NOT DETECTED Final   Carbapenem resist OXA 48 LIKE NOT DETECTED NOT DETECTED Final   Carbapenem resistance VIM NOT DETECTED NOT DETECTED Final    Comment: Performed at [facility], [address] [place], [place] [address]  Resp panel by RT-PCR (RSV, Flu A&B, Covid) Anterior Nasal Swab     Status: None   Collection Time: 06/26/24 11:16 PM   Specimen: Anterior Nasal Swab  Result Value Ref Range Status   SARS Coronavirus 2 by RT PCR NEGATIVE NEGATIVE Final    Comment: (NOTE) SARS-CoV-2 target nucleic acids are NOT DETECTED.  The SARS-CoV-2 RNA is generally detectable in upper respiratory specimens during the acute phase of infection. The lowest concentration of SARS-CoV-2 viral copies this assay can detect is 138 copies/mL. A negative result does not preclude SARS-Cov-2 infection and  should not be used as the sole basis for treatment or other patient management decisions. A negative result may occur with  improper specimen collection/handling, submission of specimen other than nasopharyngeal swab, presence of viral mutation(s) within the areas targeted by this assay, and inadequate number of viral copies(<138 copies/mL). A negative result must be combined with clinical observations, patient history, and epidemiological information. The expected result is Negative.  Fact Sheet for Patients:  bloggercourse.com  Fact Sheet for Healthcare Providers:  seriousbroker.it  This test is no t yet approved or  cleared by the [place]  FDA and  has been authorized for detection and/or diagnosis of SARS-CoV-2 by FDA under an Emergency Use Authorization (EUA). This EUA will remain  in effect (meaning this test can be used) for the duration of the COVID-19 declaration under Section 564(b)(1) of the Act, 21 U.S.C.section 360bbb-3(b)(1), unless the authorization is terminated  or revoked sooner.       Influenza A by PCR NEGATIVE NEGATIVE Final   Influenza B by PCR NEGATIVE NEGATIVE Final    Comment: (NOTE) The Xpert Xpress SARS-CoV-2/FLU/RSV plus assay is intended as an aid in the diagnosis of influenza from Nasopharyngeal swab specimens and should not be used as a sole basis for treatment. Nasal washings and aspirates are unacceptable for Xpert Xpress SARS-CoV-2/FLU/RSV testing.  Fact Sheet for Patients: bloggercourse.com  Fact Sheet for Healthcare Providers: seriousbroker.it  This test is not yet approved or cleared by the [place]  FDA and has been authorized for detection and/or diagnosis of SARS-CoV-2 by FDA under an Emergency Use Authorization (EUA). This EUA will remain in effect (meaning this test can be used) for the duration of the COVID-19 declaration under Section 564(b)(1) of the Act, 21 U.S.C. section 360bbb-3(b)(1), unless the authorization is terminated or revoked.     Resp Syncytial Virus by PCR NEGATIVE NEGATIVE Final    Comment: (NOTE) Fact Sheet for Patients: bloggercourse.com  Fact Sheet for Healthcare Providers: seriousbroker.it  This test is not yet approved or cleared by the [place]  FDA and has been authorized for detection and/or diagnosis of SARS-CoV-2 by FDA under an Emergency Use Authorization (EUA). This EUA will remain in effect (meaning this test can be used) for the duration of the COVID-19 declaration under Section 564(b)(1) of the Act, 21  U.S.C. section 360bbb-3(b)(1), unless the authorization is terminated or revoked.  Performed at [facility], [address], [place], [place] [address]   Urine Culture     Status: Abnormal (Preliminary result)   Collection Time: 06/27/24 12:08 AM   Specimen: Urine, Catheterized  Result Value Ref Range Status   Specimen Description   Final    URINE, CATHETERIZED Performed at [facility], [address], [place], [place] [address]    Special Requests   Final    NONE Performed at [facility], [address] [place], [place] [address]    Culture (A)  Final    >=100,000 COLONIES/mL KLEBSIELLA OXYTOCA 40,000 COLONIES/mL PROTEUS MIRABILIS >=100,000 COLONIES/mL ENTEROCOCCUS FAECALIS CULTURE REINCUBATED FOR BETTER GROWTH Performed at [facility], [address], [place], [place] [address]    Report Status PENDING  Incomplete      Radiology Studies last 3 days: DG Chest Portable 1 View Result Date: 06/26/2024 CLINICAL DATA:  Short of breath, confusion, sepsis EXAM: PORTABLE CHEST 1 VIEW COMPARISON:  02/17/2024 FINDINGS: Single frontal view of the chest demonstrates a stable cardiac silhouette. No acute airspace disease, effusion, or pneumothorax. No acute bony  abnormalities. IMPRESSION: 1. No acute intrathoracic process. Electronically Signed   By: Ozell Daring M.D.   On: 06/26/2024 22:22       Tabias Swayze, DO [facility] 06/28/2024, 2:00 PM    Dictation software may have been used to generate the above note. Typos may occur and escape review in typed/dictated notes. Please contact Dr Marsa directly for clarity if needed.  Staff may message me via secure chat in Epic  but this may not receive an immediate response,  please page me for urgent matters!  If 7PM-7AM, please contact night coverage [email or web address]

## 2024-06-28 NOTE — Progress Notes (Signed)
 Per Dr Wynonia Musty tele monitoring

## 2024-06-29 ENCOUNTER — Encounter: Payer: Self-pay | Admitting: Osteopathic Medicine

## 2024-06-29 DIAGNOSIS — B9689 Other specified bacterial agents as the cause of diseases classified elsewhere: Secondary | ICD-10-CM

## 2024-06-29 DIAGNOSIS — R7881 Bacteremia: Secondary | ICD-10-CM

## 2024-06-29 DIAGNOSIS — K7460 Unspecified cirrhosis of liver: Secondary | ICD-10-CM | POA: Diagnosis not present

## 2024-06-29 DIAGNOSIS — N390 Urinary tract infection, site not specified: Secondary | ICD-10-CM | POA: Diagnosis not present

## 2024-06-29 DIAGNOSIS — A4150 Gram-negative sepsis, unspecified: Secondary | ICD-10-CM | POA: Diagnosis not present

## 2024-06-29 LAB — CULTURE, BLOOD (ROUTINE X 2): Special Requests: ADEQUATE

## 2024-06-29 LAB — CBC
HCT: 27.7 % — ABNORMAL LOW (ref 39.0–52.0)
Hemoglobin: 9.2 g/dL — ABNORMAL LOW (ref 13.0–17.0)
MCH: 28.8 pg (ref 26.0–34.0)
MCHC: 33.2 g/dL (ref 30.0–36.0)
MCV: 86.8 fL (ref 80.0–100.0)
Platelets: 63 K/uL — ABNORMAL LOW (ref 150–400)
RBC: 3.19 MIL/uL — ABNORMAL LOW (ref 4.22–5.81)
RDW: 17.3 % — ABNORMAL HIGH (ref 11.5–15.5)
WBC: 4.2 K/uL (ref 4.0–10.5)
nRBC: 0 % (ref 0.0–0.2)

## 2024-06-29 LAB — BASIC METABOLIC PANEL WITH GFR
Anion gap: 5 (ref 5–15)
BUN: 31 mg/dL — ABNORMAL HIGH (ref 8–23)
CO2: 23 mmol/L (ref 22–32)
Calcium: 9.5 mg/dL (ref 8.9–10.3)
Chloride: 108 mmol/L (ref 98–111)
Creatinine, Ser: 1.72 mg/dL — ABNORMAL HIGH (ref 0.61–1.24)
GFR, Estimated: 41 mL/min — ABNORMAL LOW (ref >60)
Glucose, Bld: 76 mg/dL (ref 70–99)
Potassium: 4.3 mmol/L (ref 3.5–5.1)
Sodium: 136 mmol/L (ref 135–145)

## 2024-06-29 NOTE — Progress Notes (Signed)
 PROGRESS NOTE    [name]   FMW:[id number] DOB: Mar 12, 1951  DOA: 06/26/2024 Date of Service: 06/29/24 which is hospital day 3  PCP: [facility], [facility] course / significant events:   [name] is a 73 y.o. male with medical history significant for diastolic CHF with EF of 60 to 34%, anemia, asthma, history of E. coli and Klebsiella pneumoniae bacteremia, cirrhosis, coronary artery disease, GERD, CKD3a, hypertension dyslipidemia, who presented to the ER with acute onset of fever that was up to 103.5 and chills with associated lethargy at his group home.   10/31: admitted for sepsis / UTI, AKI, hyponatremia. Started on fluids, cefepime .  11/01: (+)bacteremia, remain on IV abx for now, Foley exchanged  11/02: pending susceptibilities, remain on IV abx plan repeat BCx tomorrow / follow w/ ID  11/03: ID to see. Echo pending.     Consultants:  none  Procedures/Surgeries: none      ASSESSMENT & PLAN:   Sepsis due to gram-negative UTI   Bacteremia Enterobacter and Klebsiella Sepsis is manifested by fever 100.8 and tachypnea of 22. IV Rocephin . pending susceptibilities plan repeat BCx  Echo pending  follow w/ ID    Hyponatremia - resolved   Liver disease, renal disease, hypovolemia  Follow BMP   AKI on CKD3a - improving, now close to baseline   Hydrating - encourage po intake  Monitor BMP Avoid NSAID   Acute on chronic anemia Iron deficiency Iron supplement Monitor CBC   Thrombocytopenia, chornic D/t liver disease  Monitor CBC  Cirrhosis  Acute hepatic encephalopathy - improved  lactulose . rifaximin   Reduced dose Aldactone  w/ soft BP Delirium precautions  Monitor ammonia and adjust lactulose  as needed    Depression Celexa  and Seroquel .   GERD without esophagitis PPI   Peripheral neuropathy Neurontin .   Dyslipidemia statin     Class 1 obesity based on BMI: Body mass index is 33.45 kg/m.[name] Significantly low or  high BMI is associated with higher medical risk.  Underweight - under 18  overweight - 25 to 29 obese - 30 or more Class 1 obesity: BMI of 30.0 to 34 Class 2 obesity: BMI of 35.0 to 39 Class 3 obesity: BMI of 40.0 to 49 Super Morbid Obesity: BMI 50-59 Super-super Morbid Obesity: BMI 60+ Healthy nutrition and physical activity advised as adjunct to other disease management and risk reduction treatments    DVT prophylaxis: SCD given low plt bleed risk  IV fluids: none Nutrition: cardiac diet  Central lines / other devices: none  Code Status: DNR ACP documentation reviewed: has DNR and MOST on file in [facility]  TOC needs: TBD - expect back to group home, possibly HH/SNF pending clinical course  Medical barriers to dispo: pending cultures, ID recs, echo. Expected medical readiness for discharge 2-3 days.              Subjective / Brief ROS:  Patient reports feeling fine today no complaints other than would like some ice cream  Denies CP/SOB.  Pain controlled.  Denies new weakness.  Tolerating diet.    Family Communication: none at this time     Objective Findings:  Vitals:   06/28/24 1745 06/28/24 2007 06/29/24 0520 06/29/24 0850  BP: 118/64 120/64 135/66 120/61  Pulse: (!) 51 (!) 52    Resp: 20  19 17   Temp: 98.4 F (36.9 C) 99.5 F (37.5 C) 98.9 F (37.2 C) 99.6 F (37.6 C)  TempSrc:  Oral  Oral  SpO2: 98% 100% 99% 100%  Weight:      Height:        Intake/Output Summary (Last 24 hours) at 06/29/2024 1350 Last data filed at 06/29/2024 1015 Gross per 24 hour  Intake 460 ml  Output 1275 ml  Net -815 ml   Filed Weights   06/26/24 2101  Weight: 94 kg    Examination:  Physical Exam Constitutional:      General: He is not in acute distress. Cardiovascular:     Rate and Rhythm: Normal rate and regular rhythm.  Pulmonary:     Effort: Pulmonary effort is normal.     Breath sounds: Normal breath sounds.  Neurological:     Mental Status: He is  alert.          Scheduled Medications:   aspirin   81 mg Oral Daily   atorvastatin   40 mg Oral Daily   Chlorhexidine  Gluconate Cloth  6 each Topical Daily   citalopram   20 mg Oral Daily   feeding supplement  237 mL Oral TID BM   gabapentin   300 mg Oral BID   guaiFENesin   600 mg Oral BID   lactulose   45 g Oral TID   methocarbamol   500 mg Oral QID   multivitamin with minerals  1 tablet Oral Daily   nystatin  1 Application Topical TID   pantoprazole   40 mg Oral Daily   QUEtiapine   50 mg Oral QHS   rifaximin   550 mg Oral BID   spironolactone   12.5 mg Oral Daily    Continuous Infusions:  cefTRIAXone  (ROCEPHIN )  IV 2 g (06/29/24 0940)    PRN Medications:  acetaminophen  **OR** acetaminophen , albuterol , metoCLOPramide  (REGLAN ) injection, nitroGLYCERIN , phenol, senna, traZODone  Antimicrobials from admission:  Anti-infectives (From admission, onward)    Start     Dose/Rate Route Frequency Ordered Stop   06/27/24 1000  rifaximin  (XIFAXAN ) tablet 550 mg        550 mg Oral 2 times daily 06/27/24 0150     06/27/24 1000  cefTRIAXone  (ROCEPHIN ) 2 g in sodium chloride  0.9 % 100 mL IVPB        2 g 200 mL/hr over 30 Minutes Intravenous Every 24 hours 06/27/24 0150 07/04/24 0959   06/26/24 2115  ceFEPIme  (MAXIPIME ) 1 g in sodium chloride  0.9 % 100 mL IVPB        1 g 200 mL/hr over 30 Minutes Intravenous  Once 06/26/24 2111 06/26/24 2229           Data Reviewed:  I have personally reviewed the following...  CBC: Recent Labs  Lab 06/26/24 2100 06/27/24 0523 06/28/24 0604 06/29/24 0515  WBC 11.5* 9.0 5.8 4.2  NEUTROABS 9.8*  --   --   --   HGB 9.6* 10.6* 9.4* 9.2*  HCT 29.3* 31.9* 28.6* 27.7*  MCV 89.1 87.4 88.8 86.8  PLT 61* 65* 59* 63*   Basic Metabolic Panel: Recent Labs  Lab 06/26/24 2100 06/26/24 2130 06/27/24 0523 06/28/24 0604 06/29/24 0515  NA 130*  --  134* 137 136  K 3.5  --  4.0 4.3 4.3  CL 100  --  104 104 108  CO2 22  --  24 22 23   GLUCOSE 118*   --  75 113* 76  BUN 41*  --  38* 35* 31*  CREATININE 2.16*  --  2.17* 1.91* 1.72*  CALCIUM  8.8*  --  9.1 9.7 9.5  MG  --  1.7  --   --   --  GFR: Estimated Creatinine Clearance: 40.5 mL/min (A) (by C-G formula based on SCr of 1.72 mg/dL (H)). Liver Function Tests: Recent Labs  Lab 06/26/24 2100  AST 53*  ALT 28  ALKPHOS 72  BILITOT 2.1*  PROT 6.4*  ALBUMIN  2.1*   No results for input(s): LIPASE, AMYLASE in the last 168 hours. Recent Labs  Lab 06/26/24 2100 06/28/24 0604  AMMONIA 78* 70*   Coagulation Profile: Recent Labs  Lab 06/26/24 2100 06/27/24 0523  INR 1.5* 1.4*   Cardiac Enzymes: No results for input(s): CKTOTAL, CKMB, CKMBINDEX, TROPONINI in the last 168 hours. BNP (last 3 results) No results for input(s): PROBNP in the last 8760 hours. HbA1C: No results for input(s): HGBA1C in the last 72 hours. CBG: No results for input(s): GLUCAP in the last 168 hours. Lipid Profile: No results for input(s): CHOL, HDL, LDLCALC, TRIG, CHOLHDL, LDLDIRECT in the last 72 hours. Thyroid  Function Tests: No results for input(s): TSH, T4TOTAL, FREET4, T3FREE, THYROIDAB in the last 72 hours. Anemia Panel: Recent Labs    06/26/24 2100 06/27/24 0523  VITAMINB12 940*  --   FOLATE  --  >20.0  FERRITIN  --  91  TIBC  --  234*  IRON  --  19*  RETICCTPCT  --  1.4   Most Recent Urinalysis On File:     Component Value Date/Time   COLORURINE YELLOW (A) 06/27/2024 0008   APPEARANCEUR HAZY (A) 06/27/2024 0008   LABSPEC 1.018 06/27/2024 0008   PHURINE 7.0 06/27/2024 0008   GLUCOSEU NEGATIVE 06/27/2024 0008   HGBUR MODERATE (A) 06/27/2024 0008   BILIRUBINUR NEGATIVE 06/27/2024 0008   KETONESUR NEGATIVE 06/27/2024 0008   PROTEINUR 30 (A) 06/27/2024 0008   NITRITE NEGATIVE 06/27/2024 0008   LEUKOCYTESUR LARGE (A) 06/27/2024 0008   Sepsis Labs: @LABRCNTIP (procalcitonin:4,lacticidven:4) Microbiology: Recent Results (from the past  240 hours)  Blood culture (routine x 2)     Status: Abnormal   Collection Time: 06/26/24  9:27 PM   Specimen: BLOOD  Result Value Ref Range Status   Specimen Description   Final    BLOOD BLOOD LEFT ARM Performed at [facility] [address] [place], [place] [address]    Special Requests   Final    BOTTLES DRAWN AEROBIC AND ANAEROBIC Blood Culture results may not be optimal due to an excessive volume of blood received in culture bottles Performed at [facility], [address] [place], [place] [address]    Culture  Setup Time   Final    GRAM NEGATIVE RODS IN BOTH AEROBIC AND ANAEROBIC BOTTLES CRITICAL VALUE NOTED.  VALUE IS CONSISTENT WITH PREVIOUSLY REPORTED AND CALLED VALUE. Performed at [facility], [address] [place], [place] [address]    Culture (A)  Final    KLEBSIELLA OXYTOCA SUSCEPTIBILITIES PERFORMED ON PREVIOUS CULTURE WITHIN THE LAST 5 DAYS. Performed at [facility], [address] [place], [place] [address]    Report Status 06/29/2024 FINAL  Final  Blood culture (routine x 2)     Status: Abnormal   Collection Time: 06/26/24  9:27 PM   Specimen: BLOOD  Result Value Ref Range Status   Specimen Description   Final    BLOOD BLOOD RIGHT ARM Performed at [facility], [address], [place], [place] [address]    Special Requests   Final    BOTTLES DRAWN AEROBIC AND ANAEROBIC Blood Culture adequate volume Performed at [facility], [address], [place], [place] [address]  Culture  Setup Time   Final    GRAM NEGATIVE RODS IN BOTH AEROBIC AND ANAEROBIC BOTTLES CRITICAL RESULT CALLED TO, READ BACK BY AND VERIFIED WITH: ALEX CHAPPELL ON 06/27/24 AT 1129 RAM/QSD Performed at [facility], [address] [place], [place] [address]    Culture KLEBSIELLA OXYTOCA (A)  Final   Report Status 06/29/2024 FINAL  Final   Organism ID, Bacteria KLEBSIELLA OXYTOCA  Final      Susceptibility   Klebsiella  oxytoca - MIC*    AMPICILLIN >=32 RESISTANT Resistant     CEFEPIME  <=0.12 SENSITIVE Sensitive     ERTAPENEM <=0.12 SENSITIVE Sensitive     CEFTRIAXONE  <=0.25 SENSITIVE Sensitive     CIPROFLOXACIN  1 RESISTANT Resistant     GENTAMICIN <=1 SENSITIVE Sensitive     MEROPENEM  <=0.25 SENSITIVE Sensitive     TRIMETH/SULFA >=320 RESISTANT Resistant     AMPICILLIN/SULBACTAM >=32 RESISTANT Resistant     PIP/TAZO Value in next row Sensitive      16 SENSITIVEThis is a modified FDA-approved test that has been validated and its performance characteristics determined by the reporting laboratory.  This laboratory is certified under the Clinical Laboratory Improvement Amendments CLIA as qualified to perform high complexity clinical laboratory testing.    * KLEBSIELLA OXYTOCA  Blood Culture ID Panel (Reflexed)     Status: Abnormal   Collection Time: 06/26/24  9:27 PM  Result Value Ref Range Status   Enterococcus faecalis NOT DETECTED NOT DETECTED Final   Enterococcus Faecium NOT DETECTED NOT DETECTED Final   Listeria monocytogenes NOT DETECTED NOT DETECTED Final   Staphylococcus species NOT DETECTED NOT DETECTED Final   Staphylococcus aureus (BCID) NOT DETECTED NOT DETECTED Final   Staphylococcus epidermidis NOT DETECTED NOT DETECTED Final   Staphylococcus lugdunensis NOT DETECTED NOT DETECTED Final   Streptococcus species NOT DETECTED NOT DETECTED Final   Streptococcus agalactiae NOT DETECTED NOT DETECTED Final   Streptococcus pneumoniae NOT DETECTED NOT DETECTED Final   Streptococcus pyogenes NOT DETECTED NOT DETECTED Final   A.calcoaceticus-baumannii NOT DETECTED NOT DETECTED Final   Bacteroides fragilis NOT DETECTED NOT DETECTED Final   Enterobacterales DETECTED (A) NOT DETECTED Final    Comment: Enterobacterales represent a large order of gram negative bacteria, not a single organism. CRITICAL RESULT CALLED TO, READ BACK BY AND VERIFIED WITH: ALEX CHAPPELL ON 06/27/24 AT 1129 RAM/QSD     Enterobacter cloacae complex NOT DETECTED NOT DETECTED Final   Escherichia coli NOT DETECTED NOT DETECTED Final   Klebsiella aerogenes NOT DETECTED NOT DETECTED Final   Klebsiella oxytoca DETECTED (A) NOT DETECTED Final    Comment: CRITICAL RESULT CALLED TO, READ BACK BY AND VERIFIED WITH: ALEX CHAPPELL ON 06/27/24 AT 1129 RAM/QSD    Klebsiella pneumoniae NOT DETECTED NOT DETECTED Final   Proteus species NOT DETECTED NOT DETECTED Final   Salmonella species NOT DETECTED NOT DETECTED Final   Serratia marcescens NOT DETECTED NOT DETECTED Final   Haemophilus influenzae NOT DETECTED NOT DETECTED Final   Neisseria meningitidis NOT DETECTED NOT DETECTED Final   Pseudomonas aeruginosa NOT DETECTED NOT DETECTED Final   Stenotrophomonas maltophilia NOT DETECTED NOT DETECTED Final   Candida albicans NOT DETECTED NOT DETECTED Final   Candida auris NOT DETECTED NOT DETECTED Final   Candida glabrata NOT DETECTED NOT DETECTED Final   Candida krusei NOT DETECTED NOT DETECTED Final   Candida parapsilosis NOT DETECTED NOT DETECTED Final   Candida tropicalis NOT DETECTED NOT DETECTED Final   Cryptococcus neoformans/gattii NOT DETECTED NOT  DETECTED Final   CTX-M ESBL NOT DETECTED NOT DETECTED Final   Carbapenem resistance IMP NOT DETECTED NOT DETECTED Final   Carbapenem resistance KPC NOT DETECTED NOT DETECTED Final   Carbapenem resistance NDM NOT DETECTED NOT DETECTED Final   Carbapenem resist OXA 48 LIKE NOT DETECTED NOT DETECTED Final   Carbapenem resistance VIM NOT DETECTED NOT DETECTED Final    Comment: Performed at [facility], [address] [place], [place] [address]  Resp panel by RT-PCR (RSV, Flu A&B, Covid) Anterior Nasal Swab     Status: None   Collection Time: 06/26/24 11:16 PM   Specimen: Anterior Nasal Swab  Result Value Ref Range Status   SARS Coronavirus 2 by RT PCR NEGATIVE NEGATIVE Final    Comment: (NOTE) SARS-CoV-2 target nucleic acids are NOT DETECTED.  The  SARS-CoV-2 RNA is generally detectable in upper respiratory specimens during the acute phase of infection. The lowest concentration of SARS-CoV-2 viral copies this assay can detect is 138 copies/mL. A negative result does not preclude SARS-Cov-2 infection and should not be used as the sole basis for treatment or other patient management decisions. A negative result may occur with  improper specimen collection/handling, submission of specimen other than nasopharyngeal swab, presence of viral mutation(s) within the areas targeted by this assay, and inadequate number of viral copies(<138 copies/mL). A negative result must be combined with clinical observations, patient history, and epidemiological information. The expected result is Negative.  Fact Sheet for Patients:  bloggercourse.com  Fact Sheet for Healthcare Providers:  seriousbroker.it  This test is no t yet approved or cleared by the [place]  FDA and  has been authorized for detection and/or diagnosis of SARS-CoV-2 by FDA under an Emergency Use Authorization (EUA). This EUA will remain  in effect (meaning this test can be used) for the duration of the COVID-19 declaration under Section 564(b)(1) of the Act, 21 U.S.C.section 360bbb-3(b)(1), unless the authorization is terminated  or revoked sooner.       Influenza A by PCR NEGATIVE NEGATIVE Final   Influenza B by PCR NEGATIVE NEGATIVE Final    Comment: (NOTE) The Xpert Xpress SARS-CoV-2/FLU/RSV plus assay is intended as an aid in the diagnosis of influenza from Nasopharyngeal swab specimens and should not be used as a sole basis for treatment. Nasal washings and aspirates are unacceptable for Xpert Xpress SARS-CoV-2/FLU/RSV testing.  Fact Sheet for Patients: bloggercourse.com  Fact Sheet for Healthcare Providers: seriousbroker.it  This test is not yet approved or  cleared by the [place]  FDA and has been authorized for detection and/or diagnosis of SARS-CoV-2 by FDA under an Emergency Use Authorization (EUA). This EUA will remain in effect (meaning this test can be used) for the duration of the COVID-19 declaration under Section 564(b)(1) of the Act, 21 U.S.C. section 360bbb-3(b)(1), unless the authorization is terminated or revoked.     Resp Syncytial Virus by PCR NEGATIVE NEGATIVE Final    Comment: (NOTE) Fact Sheet for Patients: bloggercourse.com  Fact Sheet for Healthcare Providers: seriousbroker.it  This test is not yet approved or cleared by the [place]  FDA and has been authorized for detection and/or diagnosis of SARS-CoV-2 by FDA under an Emergency Use Authorization (EUA). This EUA will remain in effect (meaning this test can be used) for the duration of the COVID-19 declaration under Section 564(b)(1) of the Act, 21 U.S.C. section 360bbb-3(b)(1), unless the authorization is terminated or revoked.  Performed at [facility], [address] [place], [place] [address]   Urine  Culture     Status: Abnormal (Preliminary result)   Collection Time: 06/27/24 12:08 AM   Specimen: Urine, Catheterized  Result Value Ref Range Status   Specimen Description   Final    URINE, CATHETERIZED Performed at [facility], [address], [place], [place] [address]    Special Requests   Final    NONE Performed at [facility], [address] [place], [place] [address]    Culture (A)  Final    >=100,000 COLONIES/mL KLEBSIELLA OXYTOCA 40,000 COLONIES/mL PROTEUS MIRABILIS >=100,000 COLONIES/mL ENTEROCOCCUS FAECALIS SUSCEPTIBILITIES TO FOLLOW Performed at [facility], [address], [place], [place] [address]    Report Status PENDING  Incomplete      Radiology Studies last 3 days: DG Chest Portable 1 View Result Date: 06/26/2024 CLINICAL DATA:   Short of breath, confusion, sepsis EXAM: PORTABLE CHEST 1 VIEW COMPARISON:  02/17/2024 FINDINGS: Single frontal view of the chest demonstrates a stable cardiac silhouette. No acute airspace disease, effusion, or pneumothorax. No acute bony abnormalities. IMPRESSION: 1. No acute intrathoracic process. Electronically Signed   By: Ozell Daring M.D.   On: 06/26/2024 22:22       Keara Pagliarulo, DO [facility] 06/29/2024, 1:50 PM    Dictation software may have been used to generate the above note. Typos may occur and escape review in typed/dictated notes. Please contact Dr Marsa directly for clarity if needed.  Staff may message me via secure chat in Epic  but this may not receive an immediate response,  please page me for urgent matters!  If 7PM-7AM, please contact night coverage [email or web address]

## 2024-06-29 NOTE — Consult Note (Signed)
 NAME: [name]  DOB: 06-25-1951  MRN: [id number]  Date/Time: 06/29/2024 9:10 AM  REQUESTING PROVIDER: Dr. Marsa Subjective:  REASON FOR CONSULT: klebsiella oxytoca bacteremia ?Not a good historian [name] is a 73 y.o. with a history of cirrhosis of the liver,Hepatic encephalopathy, hypertension, COPD, hyperlipidemia, diabetes mellitus, History of complicated UTI d due to chronic Foley and then changed to suprapubic catheter on 04/13/2024, history of  Klebsiella bacteremia in June 2025 presents from group home with falls and fever  06/26/24 20:59  BP 100/58 !  Temp 100.8 F (38.2 C) !  Pulse Rate 58 !  Resp 19  SpO2 96 %    Latest Reference Range & Units 06/26/24 21:00  WBC 4.0 - 10.5 K/uL 11.5 (H)  Hemoglobin 13.0 - 17.0 g/dL 9.6 (L)  HCT 60.9 - 47.9 % 29.3 (L)  Platelets 150 - 400 K/uL 61 (L) [1]  Creatinine 0.61 - 1.24 mg/dL 7.83 (H)   Blood culture sent and started on IV cefepime   And then switched to ceftriaxone  I am asked to see patient because of bacteremia  Past Medical History:  Diagnosis Date   (HFpEF) heart failure with preserved ejection fraction (HCC)    a.) TTE 03/20/2018: EF 60-65%, no RWMAs, mild LAE, mild-mod MR, PASP 42; b.) TTE 03/31/2021: EF 55-60%, no RWMAs, mild LVH, mild LAE, AoV sclerosis without stenosis; c.) TTE 03/25/2022: EF 60-65%, no RWMAs, AoV sclerosis without stenosis; d.) TTE 12/24/2022: EF 60-65%, no RWMAs, mild LVH, mild LAE.   Anasarca    Anemia    Anxiety    Aortic atherosclerosis    Arthritis    Asthma    Bacteremia due to Escherichia coli 05/2023   Bacteremia due to Klebsiella pneumoniae 2023   Bilateral carotid artery disease    Bradycardia    CAD (coronary artery disease) 10/21/2006   a.) MV 10/21/2006: small reversible defect involving the apical  lateral wall c/w possible ischemia; b.) MV: 03/24/2018: no ischemia; c.) MV 10/04/2021: LAD calcifications   CAD (coronary artery disease)    Cervical radiculopathy     Chest pain    a.) MV 03/21/2018: no ischemia; b.) MV 10/04/2021: no ischemia   Chronic back pain    Chronic common bile duct dilatation    Cirrhosis (HCC)    a.) (+) hepatic lesion on CT; likely hepatocellular carcinoma; b.) CT evidence of associated portal hypertension; c.) (+) abdominopelvic anasarca   COPD (chronic obstructive pulmonary disease) (HCC)    DDD (degenerative disc disease), lumbar    Depression    Frequent falls    GERD (gastroesophageal reflux disease)    HBV (hepatitis B virus) infection    HCV (hepatitis C virus)    Hernia of anterior abdominal wall    History of 2019 novel coronavirus disease (COVID-19) 04/16/2020   Homelessness    a.) limited on placement as patient is listed on [place] sex offender listing for exploitation of a minor (possession of child pornography); incarcerated 12 years (released 11/2017)   Hyperlipidemia    Hypertension    Infestation by bed bug 02/2022   Malingering    Migraine    Nephrolithiasis    Obesity    Portal hypertension (HCC)    PTSD (post-traumatic stress disorder)    a.) brief training in the [employer] at age 26; someone was killed in front of him   Pulmonary HTN (HCC)    a.) multiple CT scans desmontrating dilated PA c/w pHTN; b.) TTE 03/20/2018: PASP 42 mmHg  Sleep apnea    a.) no nocturnal PAP therapy   Sleep apnea    Stenosis of cervical spine with myelopathy (HCC)    Suicidal ideations    T2DM (type 2 diabetes mellitus) (HCC)    Thrombocytopenia    a.) related to hepatic cirrhosis; spleen normal in size   TIA (transient ischemic attack)    TIA (transient ischemic attack) 09/30/2023    Past Surgical History:  Procedure Laterality Date   ANTERIOR CERVICAL DECOMP/DISCECTOMY FUSION N/A 07/01/2023   Procedure: ANTERIOR CERVICAL DISCECTOMY AND FUSION (FORGE);  Surgeon: Clois Fret, MD;  Location: [facility];  Service: Neurosurgery;  Laterality: N/A;  Keep as last case of the day per Dr Clois   ANTERIOR CERVICAL  DISCECTOMY     APPENDECTOMY     CARDIAC CATHETERIZATION     CHOLECYSTECTOMY     COLONOSCOPY N/A 04/01/2024   Procedure: COLONOSCOPY;  Surgeon: Toledo, Ladell POUR, MD;  Location: [facility];  Service: Gastroenterology;  Laterality: N/A;   ESOPHAGOGASTRODUODENOSCOPY N/A 04/01/2024   Procedure: EGD (ESOPHAGOGASTRODUODENOSCOPY);  Surgeon: Toledo, Ladell POUR, MD;  Location: [facility];  Service: Gastroenterology;  Laterality: N/A;   EYE SURGERY     INNER EAR SURGERY     IR CYSTOSTOMY TUBE PLACEMENT/BLADDER ASPIRATION  04/13/2024   NOSE SURGERY     POLYPECTOMY  04/01/2024   Procedure: POLYPECTOMY, INTESTINE;  Surgeon: Toledo, Ladell POUR, MD;  Location: [facility];  Service: Gastroenterology;;   XI ROBOTIC LAPAROSCOPIC ASSISTED APPENDECTOMY N/A 11/05/2022   Procedure: XI ROBOTIC LAPAROSCOPIC ASSISTED APPENDECTOMY;  Surgeon: Rodolph Romano, MD;  Location: [facility];  Service: General;  Laterality: N/A;    Social History   Socioeconomic History   Marital status: Single    Spouse name: Not on file   Number of children: 2   Years of education: 12   Highest education level: 12th grade  Occupational History   Occupation: retired  Tobacco Use   Smoking status: Some Days    Types: Cigarettes   Smokeless tobacco: Never   Tobacco comments:    2 times/month per patient   Vaping Use   Vaping status: Not on file  Substance and Sexual Activity   Alcohol use: Not Currently    Comment: Unknown   Drug use: Not Currently   Sexual activity: Not Currently    Birth control/protection: None  Other Topics Concern   Not on file  Social History Narrative   ** Merged History Encounter **       Lives in [facility] care home   Social Drivers of Health   Financial Resource Strain: High Risk (04/27/2022)   Overall Financial Resource Strain (CARDIA)    Difficulty of Paying Living Expenses: Very hard  Food Insecurity: No Food Insecurity (06/28/2024)   Hunger Vital Sign    Worried About Running  Out of Food in the Last Year: Never true    Ran Out of Food in the Last Year: Never true  Transportation Needs: No Transportation Needs (06/28/2024)   PRAPARE - Administrator, Civil Service (Medical): No    Lack of Transportation (Non-Medical): No  Physical Activity: Sufficiently Active (04/16/2018)   Exercise Vital Sign    Days of Exercise per Week: 7 days    Minutes of Exercise per Session: 30 min  Stress: Stress Concern Present (04/16/2018)   [employer] of Occupational Health - Occupational Stress Questionnaire    Feeling of Stress : Very much  Social Connections: Socially Isolated (06/28/2024)   Social Connection  and Isolation Panel    Frequency of Communication with Friends and Family: More than three times a week    Frequency of Social Gatherings with Friends and Family: More than three times a week    Attends Religious Services: Never    Database Administrator or Organizations: No    Attends Banker Meetings: Never    Marital Status: Widowed  Intimate Partner Violence: Unknown (06/28/2024)   [name], Afraid, [name], and Kick questionnaire    Fear of Current or Ex-Partner: No    Emotionally Abused: Not on file    Physically Abused: Not on file    Sexually Abused: Not on file    Family History  Problem Relation Age of Onset   Hypertension Mother    Heart disease Mother    Heart failure Maternal Grandmother    Allergies  Allergen Reactions   Bee Venom Anaphylaxis   Codeine  Itching    Only itching. [facility] allergy [facility] Pre-F Virus Vaccine Swelling   Meloxicam     Other Reaction(s): ?overdose of local given at dentist--had trouble breathing   Novocain [Procaine]    Sulfa Antibiotics     hallucinations   I? Current Facility-Administered Medications  Medication Dose Route Frequency Provider Last Rate Last Admin   acetaminophen  (TYLENOL ) tablet 650 mg  650 mg Oral Q6H PRN Mansy, Jan A, MD   650 mg at 06/27/24 1543   Or    acetaminophen  (TYLENOL ) suppository 650 mg  650 mg Rectal Q6H PRN Mansy, Jan A, MD       albuterol  (PROVENTIL ) (2.5 MG/3ML) 0.083% nebulizer solution 2.5 mg  2.5 mg Nebulization Q4H PRN Mansy, Jan A, MD       aspirin  chewable tablet 81 mg  81 mg Oral Daily Mansy, Jan A, MD   81 mg at 06/28/24 9060   atorvastatin  (LIPITOR) tablet 40 mg  40 mg Oral Daily Mansy, Jan A, MD   40 mg at 06/28/24 9060   cefTRIAXone  (ROCEPHIN ) 2 g in sodium chloride  0.9 % 100 mL IVPB  2 g Intravenous Q24H Mansy, Jan A, MD   Stopped at 06/28/24 1013   Chlorhexidine  Gluconate Cloth 2 % PADS 6 each  6 each Topical Daily Nicholaus Rolland BRAVO, MD   6 each at 06/28/24 0941   citalopram  (CELEXA ) tablet 20 mg  20 mg Oral Daily Mansy, Jan A, MD   20 mg at 06/28/24 0939   feeding supplement (ENSURE ENLIVE / ENSURE PLUS) liquid 237 mL  237 mL Oral TID BM Mansy, Jan A, MD   237 mL at 06/28/24 2232   gabapentin  (NEURONTIN ) capsule 300 mg  300 mg Oral BID Mansy, Jan A, MD   300 mg at 06/28/24 2232   guaiFENesin  (MUCINEX ) 12 hr tablet 600 mg  600 mg Oral BID Mansy, Jan A, MD   600 mg at 06/28/24 2232   lactulose  (CHRONULAC ) 10 GM/15ML solution 45 g  45 g Oral TID Mansy, Jan A, MD   45 g at 06/28/24 1531   methocarbamol  (ROBAXIN ) tablet 500 mg  500 mg Oral QID Mansy, Jan A, MD   500 mg at 06/28/24 2233   metoCLOPramide  (REGLAN ) injection 10 mg  10 mg Intravenous Q6H PRN Mansy, Jan A, MD       multivitamin with minerals tablet 1 tablet  1 tablet Oral Daily Mansy, Jan A, MD   1 tablet at 06/28/24 9060   nitroGLYCERIN  (NITROSTAT ) SL tablet 0.4 mg  0.4 mg Sublingual Q5 Min x 3 PRN Mansy, Jan A, MD       nystatin (MYCOSTATIN/NYSTOP) topical powder 1 Application  1 Application Topical TID Mansy, Jan A, MD   1 Application at 06/28/24 2232   pantoprazole  (PROTONIX ) EC tablet 40 mg  40 mg Oral Daily Mansy, Jan A, MD   40 mg at 06/28/24 0939   phenol (CHLORASEPTIC) mouth spray 1 spray  1 spray Mouth/Throat PRN Mansy, Jan A, MD       QUEtiapine   (SEROQUEL ) tablet 50 mg  50 mg Oral QHS Mansy, Jan A, MD   50 mg at 06/28/24 2233   rifaximin  (XIFAXAN ) tablet 550 mg  550 mg Oral BID Mansy, Jan A, MD   550 mg at 06/28/24 2232   senna (SENOKOT) tablet 8.6 mg  1 tablet Oral BID PRN Mansy, Jan A, MD       spironolactone  (ALDACTONE ) tablet 12.5 mg  12.5 mg Oral Daily Alexander, Natalie, DO   12.5 mg at 06/28/24 9061   traZODone (DESYREL) tablet 25 mg  25 mg Oral QHS PRN Mansy, Jan A, MD         Abtx:  Anti-infectives (From admission, onward)    Start     Dose/Rate Route Frequency Ordered Stop   06/27/24 1000  rifaximin  (XIFAXAN ) tablet 550 mg        550 mg Oral 2 times daily 06/27/24 0150     06/27/24 1000  cefTRIAXone  (ROCEPHIN ) 2 g in sodium chloride  0.9 % 100 mL IVPB        2 g 200 mL/hr over 30 Minutes Intravenous Every 24 hours 06/27/24 0150 07/04/24 0959   06/26/24 2115  ceFEPIme  (MAXIPIME ) 1 g in sodium chloride  0.9 % 100 mL IVPB        1 g 200 mL/hr over 30 Minutes Intravenous  Once 06/26/24 2111 06/26/24 2229       REVIEW OF SYSTEMS:  Const:  fever, negative chills, negative weight loss Eyes: negative diplopia or visual changes, negative eye pain ENT: negative coryza, negative sore throat Resp: negative cough, hemoptysis, dyspnea Cards: negative for chest pain, palpitations, lower extremity edema GU: has foley GI: Negative for abdominal pain, diarrhea, bleeding, constipation Skin: negative for rash and pruritus Heme: negative for easy bruising and gum/nose bleeding MS: weakness Neurolo:fall Psych: negative for feelings of anxiety, depression  Endocrine: negative for thyroid , diabetes Allergy/Immunology- as above  Objective:  VITALS:  BP 120/61 (BP Location: Right Arm)   Pulse (!) 52   Temp 99.6 F (37.6 C) (Oral)   Resp 17   Ht 5' 6 (1.676 m)   Wt 94 kg   SpO2 100%   BMI 33.45 kg/m   PHYSICAL EXAM:  General:  Awake Alert, cooperative, no distress,   Head: Normocephalic, without obvious abnormality,  atraumatic. Eyes: Conjunctivae clear, anicteric sclerae. Pupils are equal ENT Nares normal. No drainage or sinus tenderness. Lips, mucosa, and tongue normal. No Thrush Dentures top half Neck:  symmetrical, no adenopathy, thyroid : non tender no carotid bruit and no JVD. Lungs: b/la ir entry. Heart: s1s2 Abdomen: Soft, SPC not present- the skin is closed foley Extremities: atraumatic, no cyanosis. No edema. No clubbing Skin: No rashes or lesions. Or bruising Lymph: Cervical, supraclavicular normal. Neurologic: Grossly non-focal Pertinent Labs Lab Results CBC    Component Value Date/Time   WBC 4.2 06/29/2024 0515   RBC 3.19 (L) 06/29/2024 0515   HGB 9.2 (L) 06/29/2024 0515   HCT 27.7 (L) 06/29/2024 0515  PLT 63 (L) 06/29/2024 0515   MCV 86.8 06/29/2024 0515   MCH 28.8 06/29/2024 0515   MCHC 33.2 06/29/2024 0515   RDW 17.3 (H) 06/29/2024 0515   LYMPHSABS 0.6 (L) 06/26/2024 2100   MONOABS 1.0 06/26/2024 2100   EOSABS 0.1 06/26/2024 2100   BASOSABS 0.0 06/26/2024 2100       Latest Ref Rng & Units 06/29/2024    5:15 AM 06/28/2024    6:04 AM 06/27/2024    5:23 AM  CMP  Glucose 70 - 99 mg/dL 76  886  75   BUN 8 - 23 mg/dL 31  35  38   Creatinine 0.61 - 1.24 mg/dL 8.27  8.08  7.82   Sodium 135 - 145 mmol/L 136  137  134   Potassium 3.5 - 5.1 mmol/L 4.3  4.3  4.0   Chloride 98 - 111 mmol/L 108  104  104   CO2 22 - 32 mmol/L 23  22  24    Calcium  8.9 - 10.3 mg/dL 9.5  9.7  9.1       Microbiology: Recent Results (from the past 240 hours)  Blood culture (routine x 2)     Status: Abnormal (Preliminary result)   Collection Time: 06/26/24  9:27 PM   Specimen: BLOOD  Result Value Ref Range Status   Specimen Description   Final    BLOOD BLOOD LEFT ARM Performed at [facility], [address] [place], [place] [address]    Special Requests   Final    BOTTLES DRAWN AEROBIC AND ANAEROBIC Blood Culture results may not be optimal due to an excessive volume of blood  received in culture bottles Performed at [facility], [address] [place], [place] [address]    Culture  Setup Time   Final    GRAM NEGATIVE RODS IN BOTH AEROBIC AND ANAEROBIC BOTTLES CRITICAL VALUE NOTED.  VALUE IS CONSISTENT WITH PREVIOUSLY REPORTED AND CALLED VALUE. Performed at [facility], [address] [place], [place] [address]    Culture KLEBSIELLA OXYTOCA (A)  Final   Report Status PENDING  Incomplete  Blood culture (routine x 2)     Status: Abnormal (Preliminary result)   Collection Time: 06/26/24  9:27 PM   Specimen: BLOOD  Result Value Ref Range Status   Specimen Description   Final    BLOOD BLOOD RIGHT ARM Performed at [facility], [address], [place], [place] [address]    Special Requests   Final    BOTTLES DRAWN AEROBIC AND ANAEROBIC Blood Culture adequate volume Performed at [facility], [address] [place], [place] [address]    Culture  Setup Time   Final    GRAM NEGATIVE RODS IN BOTH AEROBIC AND ANAEROBIC BOTTLES CRITICAL RESULT CALLED TO, READ BACK BY AND VERIFIED WITH: ALEX CHAPPELL ON 06/27/24 AT 1129 RAM/QSD    Culture (A)  Final    KLEBSIELLA OXYTOCA SUSCEPTIBILITIES TO FOLLOW Performed at [facility], [address], [place], [place] [address]    Report Status PENDING  Incomplete  Blood Culture ID Panel (Reflexed)     Status: Abnormal   Collection Time: 06/26/24  9:27 PM  Result Value Ref Range Status   Enterococcus faecalis NOT DETECTED NOT DETECTED Final   Enterococcus Faecium NOT DETECTED NOT DETECTED Final   Listeria monocytogenes NOT DETECTED NOT DETECTED Final   Staphylococcus species NOT DETECTED NOT DETECTED Final   Staphylococcus aureus (BCID) NOT DETECTED NOT DETECTED Final  Staphylococcus epidermidis NOT DETECTED NOT DETECTED Final   Staphylococcus lugdunensis NOT DETECTED NOT DETECTED Final   Streptococcus species NOT DETECTED NOT DETECTED Final   Streptococcus agalactiae  NOT DETECTED NOT DETECTED Final   Streptococcus pneumoniae NOT DETECTED NOT DETECTED Final   Streptococcus pyogenes NOT DETECTED NOT DETECTED Final   A.calcoaceticus-baumannii NOT DETECTED NOT DETECTED Final   Bacteroides fragilis NOT DETECTED NOT DETECTED Final   Enterobacterales DETECTED (A) NOT DETECTED Final    Comment: Enterobacterales represent a large order of gram negative bacteria, not a single organism. CRITICAL RESULT CALLED TO, READ BACK BY AND VERIFIED WITH: ALEX CHAPPELL ON 06/27/24 AT 1129 RAM/QSD    Enterobacter cloacae complex NOT DETECTED NOT DETECTED Final   Escherichia coli NOT DETECTED NOT DETECTED Final   Klebsiella aerogenes NOT DETECTED NOT DETECTED Final   Klebsiella oxytoca DETECTED (A) NOT DETECTED Final    Comment: CRITICAL RESULT CALLED TO, READ BACK BY AND VERIFIED WITH: ALEX CHAPPELL ON 06/27/24 AT 1129 RAM/QSD    Klebsiella pneumoniae NOT DETECTED NOT DETECTED Final   Proteus species NOT DETECTED NOT DETECTED Final   Salmonella species NOT DETECTED NOT DETECTED Final   Serratia marcescens NOT DETECTED NOT DETECTED Final   Haemophilus influenzae NOT DETECTED NOT DETECTED Final   Neisseria meningitidis NOT DETECTED NOT DETECTED Final   Pseudomonas aeruginosa NOT DETECTED NOT DETECTED Final   Stenotrophomonas maltophilia NOT DETECTED NOT DETECTED Final   Candida albicans NOT DETECTED NOT DETECTED Final   Candida auris NOT DETECTED NOT DETECTED Final   Candida glabrata NOT DETECTED NOT DETECTED Final   Candida krusei NOT DETECTED NOT DETECTED Final   Candida parapsilosis NOT DETECTED NOT DETECTED Final   Candida tropicalis NOT DETECTED NOT DETECTED Final   Cryptococcus neoformans/gattii NOT DETECTED NOT DETECTED Final   CTX-M ESBL NOT DETECTED NOT DETECTED Final   Carbapenem resistance IMP NOT DETECTED NOT DETECTED Final   Carbapenem resistance KPC NOT DETECTED NOT DETECTED Final   Carbapenem resistance NDM NOT DETECTED NOT DETECTED Final   Carbapenem  resist OXA 48 LIKE NOT DETECTED NOT DETECTED Final   Carbapenem resistance VIM NOT DETECTED NOT DETECTED Final    Comment: Performed at [facility], [address] [place], [place] [address]  Resp panel by RT-PCR (RSV, Flu A&B, Covid) Anterior Nasal Swab     Status: None   Collection Time: 06/26/24 11:16 PM   Specimen: Anterior Nasal Swab  Result Value Ref Range Status   SARS Coronavirus 2 by RT PCR NEGATIVE NEGATIVE Final    Comment: (NOTE) SARS-CoV-2 target nucleic acids are NOT DETECTED.  The SARS-CoV-2 RNA is generally detectable in upper respiratory specimens during the acute phase of infection. The lowest concentration of SARS-CoV-2 viral copies this assay can detect is 138 copies/mL. A negative result does not preclude SARS-Cov-2 infection and should not be used as the sole basis for treatment or other patient management decisions. A negative result may occur with  improper specimen collection/handling, submission of specimen other than nasopharyngeal swab, presence of viral mutation(s) within the areas targeted by this assay, and inadequate number of viral copies(<138 copies/mL). A negative result must be combined with clinical observations, patient history, and epidemiological information. The expected result is Negative.  Fact Sheet for Patients:  bloggercourse.com  Fact Sheet for Healthcare Providers:  seriousbroker.it  This test is no t yet approved or cleared by the [place]  FDA and  has been authorized for detection and/or diagnosis of SARS-CoV-2 by FDA under an Emergency  Use Authorization (EUA). This EUA will remain  in effect (meaning this test can be used) for the duration of the COVID-19 declaration under Section 564(b)(1) of the Act, 21 U.S.C.section 360bbb-3(b)(1), unless the authorization is terminated  or revoked sooner.       Influenza A by PCR NEGATIVE NEGATIVE Final   Influenza B by PCR  NEGATIVE NEGATIVE Final    Comment: (NOTE) The Xpert Xpress SARS-CoV-2/FLU/RSV plus assay is intended as an aid in the diagnosis of influenza from Nasopharyngeal swab specimens and should not be used as a sole basis for treatment. Nasal washings and aspirates are unacceptable for Xpert Xpress SARS-CoV-2/FLU/RSV testing.  Fact Sheet for Patients: bloggercourse.com  Fact Sheet for Healthcare Providers: seriousbroker.it  This test is not yet approved or cleared by the [place]  FDA and has been authorized for detection and/or diagnosis of SARS-CoV-2 by FDA under an Emergency Use Authorization (EUA). This EUA will remain in effect (meaning this test can be used) for the duration of the COVID-19 declaration under Section 564(b)(1) of the Act, 21 U.S.C. section 360bbb-3(b)(1), unless the authorization is terminated or revoked.     Resp Syncytial Virus by PCR NEGATIVE NEGATIVE Final    Comment: (NOTE) Fact Sheet for Patients: bloggercourse.com  Fact Sheet for Healthcare Providers: seriousbroker.it  This test is not yet approved or cleared by the [place]  FDA and has been authorized for detection and/or diagnosis of SARS-CoV-2 by FDA under an Emergency Use Authorization (EUA). This EUA will remain in effect (meaning this test can be used) for the duration of the COVID-19 declaration under Section 564(b)(1) of the Act, 21 U.S.C. section 360bbb-3(b)(1), unless the authorization is terminated or revoked.  Performed at [facility], [address] [place], [place] [address]   Urine Culture     Status: Abnormal (Preliminary result)   Collection Time: 06/27/24 12:08 AM   Specimen: Urine, Catheterized  Result Value Ref Range Status   Specimen Description   Final    URINE, CATHETERIZED Performed at [facility], [address] [place], [place] [address]     Special Requests   Final    NONE Performed at [facility], [address] [place], [place] [address]    Culture (A)  Final    >=100,000 COLONIES/mL KLEBSIELLA OXYTOCA 40,000 COLONIES/mL PROTEUS MIRABILIS >=100,000 COLONIES/mL ENTEROCOCCUS FAECALIS CULTURE REINCUBATED FOR BETTER GROWTH Performed at [facility], [address] [place], [place] [address]    Report Status PENDING  Incomplete      IMAGING RESULTS: CXR no infiltrate I have personally reviewed the films ? Impression/Recommendation Klebsiella oxytoca bacteremia with Complicated UTI due to foley Pt is on ceftriaxone  No PO option as R to cipro  and bactrim Will need IV antibiotic for total of 7 days No risk for endocarditis  Cirrhosis liver with decompensation  On rifaximin  Hepatic encephalopathy  Treated HEPC ?? Hypertension- management as per primary team   Past h/o kleb pneumoniae bacteremia  This consult involved complex antimicrobial management _ Discussed with patient,  Note:  This document was prepared using Dragon voice recognition software and may include unintentional dictation errors.

## 2024-06-29 NOTE — Care Management Important Message (Signed)
 Important Message  Patient Details  Name: [name] MRN: [id number] Date of Birth: [birth year]   Important Message Given:  Yes - Medicare IM     [name] W, CMA 06/29/2024, 11:40 AM

## 2024-06-29 NOTE — TOC Initial Note (Signed)
 Transition of Care [facility]) - Initial/Assessment Note    Patient Details  Name: [name] MRN: [id number] Date of Birth: 07/28/1951  Transition of Care [facility]) CM/SW Contact:    Dalia GORMAN Fuse, RN Phone Number: 06/29/2024, 9:14 AM  Clinical Narrative:                   Expected Discharge Plan: Group Home Barriers to Discharge: Continued Medical Work up   Patient Goals and CMS Choice    Patient on IV ABX for UTI. The plan is to repeat cultures today. The patient lives at [facility]. TOC spoke with [name] at the group home, and the patient can return when he is medically ready. TOC will continue to follow for discharge planning.         Expected Discharge Plan and Services       Living arrangements for the past 2 months: Group Home                                      Prior Living Arrangements/Services Living arrangements for the past 2 months: Group Home Lives with:: Facility Resident                   Activities of Daily Living      Permission Sought/Granted                  Emotional Assessment              Admission diagnosis:  UTI (urinary tract infection) [N39.0] Prolonged Q-T interval on ECG [R94.31] Acute renal insufficiency [N28.9] Chronic indwelling Foley catheter [Z97.8] Sepsis due to undetermined organism (HCC) [A41.9] Sepsis with acute organ dysfunction, due to unspecified organism, unspecified organ dysfunction type, unspecified whether septic shock present (HCC) [A41.9, R65.20] Patient Active Problem List   Diagnosis Date Noted   Sepsis due to gram-negative UTI (HCC) 06/27/2024   Hyponatremia 06/27/2024   Acute on chronic anemia 06/27/2024   Dyslipidemia 06/27/2024   Peripheral neuropathy 06/27/2024   GERD without esophagitis 06/27/2024   Bacteremia 02/18/2024   Complicated UTI (urinary tract infection) 02/17/2024   Chronic indwelling Foley catheter 01/03/2024   Hypomagnesemia 12/30/2023    Depression with anxiety 12/30/2023   Altered mental status 11/12/2023   Chronic kidney disease, stage 3a (HCC) 10/21/2023   Myocardial injury 10/21/2023   CKD stage 3a, GFR 45-59 ml/min (HCC) - baseline scr 1.3 10/01/2023   Hepatic encephalopathy (HCC) 09/30/2023   Liver cirrhosis (HCC) 09/30/2023   Essential hypertension 09/30/2023   Diabetes mellitus type 2, noninsulin dependent (HCC) 09/30/2023   CAD (coronary artery disease) 09/30/2023   Hyperlipidemia 09/30/2023   Hepatic encephalopathy (HCC) 08/01/2023   Hyperammonemia 07/30/2023   Cervical spinal stenosis 07/01/2023   Cervical myelopathy (HCC) 06/21/2023   Cervical stenosis of spine 06/21/2023   Cervical radiculopathy 06/20/2023   E coli bacteremia 06/17/2023   UTI (urinary tract infection) 06/17/2023   Left leg cellulitis 03/23/2023   Leukocytosis 03/23/2023   Left leg pain 03/23/2023   Suicidal ideation 01/14/2023   Acute pain 01/14/2023   Adrenal insufficiency 12/26/2022   Orthostatic hypotension 12/24/2022   Rib pain on right side 12/24/2022   Hepatitis B infection 12/24/2022   Myocardial ischemia 12/24/2022   Acute appendicitis 11/05/2022   Pancytopenia (HCC) 11/05/2022   Sinus bradycardia 11/05/2022   Acute metabolic encephalopathy 09/22/2022   Liver lesion 09/22/2022  Obesity (BMI 30-39.9) 09/22/2022   Acute hepatic encephalopathy (HCC) 09/22/2022   MDD (major depressive disorder), recurrent severe, 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   Major depressive disorder, 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 psychotic features (HCC) 09/15/2022   Protein-calorie malnutrition, severe 08/31/2022   Hypotension 08/31/2022   Weakness    Hx of transient ischemic attack (TIA) 04/28/2022   Obesity    Chronic diastolic CHF (congestive heart failure) (HCC)    Depression    Septic shock (HCC) 03/28/2022   Bacteremia due to Klebsiella pneumoniae 03/28/2022   Hypoglycemia 03/28/2022   Syncope 03/24/2022   Hypokalemia 03/24/2022   Fever  03/24/2022   COPD (chronic obstructive pulmonary disease) (HCC)    Elevated troponin    Abnormal LFTs    History of hepatitis C    AKI (acute kidney injury)    Pure hypercholesterolemia    Obesity, Class III, BMI 40-49.9 (morbid obesity) (HCC) 04/18/2020   Thrombocytopenia 04/17/2020   Leukopenia 04/17/2020   HTN (hypertension) 04/17/2018   Uncontrolled type 2 diabetes mellitus with hyperglycemia, without long-term current use of insulin  (HCC) 04/17/2018   Lymphedema 04/17/2018   Normocytic anemia 04/07/2018   Chest pain 03/20/2018   Unsheltered homelessness 12/16/2017   PCP:  [employer] Pharmacy:   [id number] [address]), [place] - [address] [place]) [place] [address] Phone: [phone] Fax: [phone]  [facility] [address] [place] [address] Phone: [phone] Fax: [phone]  [facility] - [place], [place] - [place] [address] [place] [address] Phone: [phone] Fax: [phone]     Social Drivers of Health (SDOH) Social History: SDOH Screenings   Food Insecurity: No Food Insecurity (06/28/2024)  Housing: Unknown (06/28/2024)  Transportation Needs: No Transportation Needs (06/28/2024)  Utilities: Not At Risk (06/28/2024)  Alcohol Screen: Low Risk  (09/24/2022)  Depression (PHQ2-9): High Risk (04/26/2022)  Financial Resource Strain: High Risk (04/27/2022)  Physical Activity: Sufficiently Active (04/16/2018)  Social Connections: Socially Isolated (06/28/2024)  Stress: Stress Concern Present (04/16/2018)  Tobacco Use: High Risk (06/11/2024)   SDOH Interventions:     Readmission Risk Interventions    11/29/2023    5:03 PM 11/13/2023    2:21 PM 11/12/2023    4:00 PM  Readmission Risk Prevention Plan  Transportation Screening Not Complete  Not Complete  Medication Review Furniture Conservator/restorer) Not Complete Complete Not Complete  PCP or Specialist appointment within 3-5 days of discharge Not Complete Complete Complete  HRI or Home Care Consult Not Complete Complete   SW Recovery Care/Counseling Consult Not Complete Complete   Palliative Care Screening Not Applicable Complete   Skilled Nursing Facility Not Applicable Not Applicable

## 2024-06-30 ENCOUNTER — Inpatient Hospital Stay: Admit: 2024-06-30

## 2024-06-30 ENCOUNTER — Inpatient Hospital Stay (HOSPITAL_COMMUNITY)
Admit: 2024-06-30 | Discharge: 2024-06-30 | Disposition: A | Discharge status: Home / Self Care | Attending: Osteopathic Medicine | Admitting: Osteopathic Medicine

## 2024-06-30 DIAGNOSIS — K7460 Unspecified cirrhosis of liver: Secondary | ICD-10-CM | POA: Diagnosis not present

## 2024-06-30 DIAGNOSIS — R7881 Bacteremia: Secondary | ICD-10-CM

## 2024-06-30 DIAGNOSIS — B9689 Other specified bacterial agents as the cause of diseases classified elsewhere: Secondary | ICD-10-CM | POA: Diagnosis not present

## 2024-06-30 DIAGNOSIS — N390 Urinary tract infection, site not specified: Secondary | ICD-10-CM | POA: Diagnosis not present

## 2024-06-30 DIAGNOSIS — A4150 Gram-negative sepsis, unspecified: Secondary | ICD-10-CM | POA: Diagnosis not present

## 2024-06-30 LAB — URINE CULTURE: Culture: >=100000 — AB

## 2024-06-30 LAB — BASIC METABOLIC PANEL WITH GFR
Anion gap: 4 — ABNORMAL LOW (ref 5–15)
BUN: 31 mg/dL — ABNORMAL HIGH (ref 8–23)
CO2: 25 mmol/L (ref 22–32)
Calcium: 9.7 mg/dL (ref 8.9–10.3)
Chloride: 104 mmol/L (ref 98–111)
Creatinine, Ser: 1.71 mg/dL — ABNORMAL HIGH (ref 0.61–1.24)
GFR, Estimated: 41 mL/min — ABNORMAL LOW (ref >60)
Glucose, Bld: 88 mg/dL (ref 70–99)
Potassium: 4.1 mmol/L (ref 3.5–5.1)
Sodium: 133 mmol/L — ABNORMAL LOW (ref 135–145)

## 2024-06-30 LAB — ECHOCARDIOGRAM COMPLETE
AR max vel: 2.99 cm2
AV Area VTI: 3.1 cm2
AV Area mean vel: 2.9 cm2
AV Mean grad: 4 mmHg
AV Peak grad: 8.5 mmHg
Ao pk vel: 1.46 m/s
Area-P 1/2: 2.17 cm2
Height: 66 in
MV VTI: 1.95 cm2
S' Lateral: 3.6 cm
Weight: 3315.72 [oz_av]

## 2024-06-30 LAB — CBC
HCT: 29.5 % — ABNORMAL LOW (ref 39.0–52.0)
Hemoglobin: 9.4 g/dL — ABNORMAL LOW (ref 13.0–17.0)
MCH: 29 pg (ref 26.0–34.0)
MCHC: 31.9 g/dL (ref 30.0–36.0)
MCV: 91 fL (ref 80.0–100.0)
Platelets: 66 K/uL — ABNORMAL LOW (ref 150–400)
RBC: 3.24 MIL/uL — ABNORMAL LOW (ref 4.22–5.81)
RDW: 17.2 % — ABNORMAL HIGH (ref 11.5–15.5)
WBC: 3.5 K/uL — ABNORMAL LOW (ref 4.0–10.5)
nRBC: 0 % (ref 0.0–0.2)

## 2024-06-30 NOTE — Progress Notes (Signed)
 Date of Admission:  06/26/2024      ID: [name] is a 73 y.o. male  Principal Problem:   Sepsis due to gram-negative UTI [facility]) Active Problems:   Depression   Acute hepatic encephalopathy (HCC)   UTI (urinary tract infection)   Hyponatremia   Acute on chronic anemia   Dyslipidemia   Peripheral neuropathy   GERD without esophagitis   [name] is a 73 y.o. with a history of cirrhosis of the liver,Hepatic encephalopathy, hypertension, COPD, hyperlipidemia, diabetes mellitus, History of complicated UTI d due to chronic Foley and then changed to suprapubic catheter on 04/13/2024, history of  Klebsiella bacteremia in June 2025 presents from group home with falls and fever Subjective: He is feeling much better   Medications:   aspirin   81 mg Oral Daily   atorvastatin   40 mg Oral Daily   Chlorhexidine  Gluconate Cloth  6 each Topical Daily   citalopram   20 mg Oral Daily   feeding supplement  237 mL Oral TID BM   gabapentin   300 mg Oral BID   guaiFENesin   600 mg Oral BID   lactulose   45 g Oral TID   methocarbamol   500 mg Oral QID   multivitamin with minerals  1 tablet Oral Daily   nystatin  1 Application Topical TID   pantoprazole   40 mg Oral Daily   QUEtiapine   50 mg Oral QHS   rifaximin   550 mg Oral BID   spironolactone   12.5 mg Oral Daily    Objective: Vital signs in last 24 hours: Patient Vitals for the past 24 hrs:  BP Temp Temp src Pulse Resp SpO2  06/30/24 1604 (!) 108/56 99.3 F (37.4 C) Oral (!) 48 18 97 %  06/30/24 0732 122/67 99.3 F (37.4 C) -- (!) 45 16 98 %  06/30/24 0440 109/71 98.3 F (36.8 C) -- (!) 45 20 99 %  06/29/24 2041 105/67 98.2 F (36.8 C) -- (!) 49 20 100 %     Lines and Device Date on insertion # of days DC  Engineer, Technical Sales     ETT       PHYSICAL EXAM:  General: Alert, cooperative, no distress, appears stated age.  Lungs: Clear to auscultation bilaterally. No Wheezing or Rhonchi. No rales. Heart:  Regular rate and rhythm, no murmur, rub or gallop. Abdomen: Soft, non-tender,not distended. Bowel sounds normal. No masses Foley Extremities: atraumatic, no cyanosis. No edema. No clubbing Skin: No rashes or lesions. Or bruising Lymph: Cervical, supraclavicular normal. Neurologic: Grossly non-focal  Lab Results    Latest Ref Rng & Units 06/30/2024    5:10 AM 06/29/2024    5:15 AM 06/28/2024    6:04 AM  CBC  WBC 4.0 - 10.5 K/uL 3.5  4.2  5.8   Hemoglobin 13.0 - 17.0 g/dL 9.4  9.2  9.4   Hematocrit 39.0 - 52.0 % 29.5  27.7  28.6   Platelets 150 - 400 K/uL 66  63  59        Latest Ref Rng & Units 06/30/2024    5:10 AM 06/29/2024    5:15 AM 06/28/2024    6:04 AM  CMP  Glucose 70 - 99 mg/dL 88  76  886   BUN 8 - 23 mg/dL 31  31  35   Creatinine 0.61 - 1.24 mg/dL 8.28  8.27  8.08   Sodium 135 - 145 mmol/L 133  136  137  Potassium 3.5 - 5.1 mmol/L 4.1  4.3  4.3   Chloride 98 - 111 mmol/L 104  108  104   CO2 22 - 32 mmol/L 25  23  22    Calcium  8.9 - 10.3 mg/dL 9.7  9.5  9.7       Microbiology: Blood culture 8: Klebsiella oxytoca Studies/Results: ECHOCARDIOGRAM COMPLETE Result Date: 06/30/2024    ECHOCARDIOGRAM REPORT   Patient Name:   [name] Date of Exam: 06/30/2024 Medical Rec #:  [id number]      Height: Accession #:    [id number]     Weight: Date of Birth:  03/22/1951     BSA: Patient Age:    72 years       BP:           122/67 mmHg Patient Gender: M              HR:           45 bpm. Exam Location:  [facility] Procedure: 2D Echo, Cardiac Doppler, Color Doppler, 3D Echo and Strain Analysis            (Both Spectral and Color Flow Doppler were utilized during            procedure). Indications:     Bacteremia R78.81  History:         Patient has prior history of Echocardiogram examinations, most                  recent 12/24/2022. CAD. Chest pain.  Sonographer:     Christopher Furnace Referring Phys:  [id number] LANETA BLUNT Diagnosing Phys: Lonni Hanson MD  Sonographer Comments: Global  longitudinal strain was attempted. IMPRESSIONS  1. Left ventricular ejection fraction, by estimation, is 55 to 60%. The left ventricle has normal function. The left ventricle has no regional wall motion abnormalities. There is mild left ventricular hypertrophy. Left ventricular diastolic parameters were normal. The average left ventricular global longitudinal strain is -18.1 %. The global longitudinal strain is normal.  2. Right ventricular systolic function is normal. The right ventricular size is normal. Tricuspid regurgitation signal is inadequate for assessing PA pressure.  3. Left atrial size was mildly dilated.  4. The mitral valve is normal in structure. Trivial mitral valve regurgitation. No evidence of mitral stenosis.  5. The aortic valve is tricuspid. Aortic valve regurgitation is not visualized. No aortic stenosis is present. FINDINGS  Left Ventricle: Left ventricular ejection fraction, by estimation, is 55 to 60%. The left ventricle has normal function. The left ventricle has no regional wall motion abnormalities. The average left ventricular global longitudinal strain is -18.1 %. Strain was performed and the global longitudinal strain is normal. The left ventricular internal cavity size was normal in size. There is mild left ventricular hypertrophy. Left ventricular diastolic parameters were normal. Right Ventricle: The right ventricular size is normal. No increase in right ventricular wall thickness. Right ventricular systolic function is normal. Tricuspid regurgitation signal is inadequate for assessing PA pressure. Left Atrium: Left atrial size was mildly dilated. Right Atrium: Right atrial size was normal in size. Pericardium: The pericardium was not well visualized. Mitral Valve: The mitral valve is normal in structure. Trivial mitral valve regurgitation. No evidence of mitral valve stenosis. MV peak gradient, 3.9 mmHg. The mean mitral valve gradient is 1.0 mmHg. Tricuspid Valve: The tricuspid  valve is grossly normal. Tricuspid valve regurgitation is trivial. Aortic Valve: The aortic valve is tricuspid. Aortic valve regurgitation is not visualized. No  aortic stenosis is present. Aortic valve mean gradient measures 4.0 mmHg. Aortic valve peak gradient measures 8.5 mmHg. Aortic valve area, by VTI measures 3.10 cm. Pulmonic Valve: The pulmonic valve was not well visualized. Pulmonic valve regurgitation is not visualized. No evidence of pulmonic stenosis. Aorta: The aortic root is normal in size and structure. Pulmonary Artery: The pulmonary artery is of normal size. IAS/Shunts: The interatrial septum was not well visualized.  LEFT VENTRICLE PLAX 2D LVIDd:         5.30 cm   Diastology LVIDs:         3.60 cm   LV e' medial:    14.00 cm/s LV PW:         1.30 cm   LV E/e' medial:  6.8 LV IVS:        1.30 cm   LV e' lateral:   14.90 cm/s LVOT diam:     2.20 cm   LV E/e' lateral: 6.4 LV SV:         86 LVOT Area:     3.80 cm  2D Longitudinal Strain LV IVRT:       98 msec   2D Strain GLS Avg:     -18.1 %  RIGHT VENTRICLE RV Basal diam:  3.60 cm RV Mid diam:    3.40 cm RV S prime:     17.40 cm/s LEFT ATRIUM           RIGHT ATRIUM LA diam:      4.20 cm RA Area:     17.60 cm LA Vol (A2C): 54.7 ml RA Volume:   39.30 ml LA Vol (A4C): 54.6 ml  AORTIC VALVE AV Area (Vmax):    2.99 cm AV Area (Vmean):   2.90 cm AV Area (VTI):     3.10 cm AV Vmax:           146.00 cm/s AV Vmean:          93.600 cm/s AV VTI:            0.276 m AV Peak Grad:      8.5 mmHg AV Mean Grad:      4.0 mmHg LVOT Vmax:         115.00 cm/s LVOT Vmean:        71.300 cm/s LVOT VTI:          0.225 m LVOT/AV VTI ratio: 0.82  AORTA Ao Root diam: 3.00 cm MITRAL VALVE MV Area (PHT): 2.17 cm    SHUNTS MV Area VTI:   1.95 cm    Systemic VTI:  0.22 m MV Peak grad:  3.9 mmHg    Systemic Diam: 2.20 cm MV Mean grad:  1.0 mmHg MV Vmax:       0.98 m/s MV Vmean:      55.7 cm/s MV Decel Time: 350 msec MV E velocity: 95.40 cm/s MV A velocity: 60.30 cm/s MV E/A  ratio:  1.58 Lonni End MD Electronically signed by Lonni Hanson MD Signature Date/Time: 06/30/2024/3:43:07 PM    Final      Assessment/Plan: Klebsiella oxytoca bacteremia with Complicated UTI due to foley Pt is on ceftriaxone  No PO option as R to cipro  and bactrim Will need IV antibiotic for total of 7 days- 07/04/24 No risk for endocarditis He had a suprapubic catheter which was removed and the site is closed completely   Cirrhosis liver with decompensation   Hepatic encephalopathy: Resolved  On rifaximin    Treated HEPC ?? Hypertension- management as  per primary team     Past h/o kleb pneumoniae bacteremia   Discussed the management with the patient and the hospitalist.

## 2024-06-30 NOTE — Progress Notes (Signed)
 PROGRESS NOTE    [name]   FMW:[id number] DOB: Jun 30, 1951  DOA: 06/26/2024 Date of Service: 06/30/24 which is hospital day 4  PCP: [facility], [facility] course / significant events:   [name] is a 73 y.o. male with medical history significant for diastolic CHF with EF of 60 to 34%, anemia, asthma, history of E. coli and Klebsiella pneumoniae bacteremia, cirrhosis, coronary artery disease, GERD, CKD3a, hypertension dyslipidemia, who presented to the ER with acute onset of fever that was up to 103.5 and chills with associated lethargy at his group home.   10/31: admitted for sepsis / UTI, AKI, hyponatremia. Started on fluids, cefepime .  11/01: (+)bacteremia, remain on IV abx for now, Foley exchanged  11/02: pending susceptibilities, remain on IV abx plan repeat BCx tomorrow / follow w/ ID  11/03: ID to see 11/04: will need 7 days IV abx total , can't go to group home for this an on sex offender registry so can't go to SNF will be here     Consultants:  none  Procedures/Surgeries: none      ASSESSMENT & PLAN:   Sepsis due to gram-negative UTI   Klebsiella oxytoca bacteremia with Complicated UTI  Bacteremia Enterobacter and Klebsiella Sepsis is manifested by fever 100.8 and tachypnea of 22. IV Rocephin  - cultures note good sensitivity to this repeat BCx  No risk for endocarditis - Echo not needed   follow w/ ID --> recs 7 days IV abx  No PO option as resistant to cipro  and bactrim   Hyponatremia - resolved   Liver disease, renal disease, hypovolemia  Follow BMP   AKI on CKD3a - improving, now close to baseline   Hydrating - encourage po intake  Monitor BMP Avoid NSAID   Acute on chronic anemia Iron deficiency Iron supplement Monitor CBC   Thrombocytopenia, chornic D/t liver disease  Monitor CBC  Cirrhosis  Acute hepatic encephalopathy - improved  lactulose . rifaximin   Reduced dose Aldactone  w/ soft BP Delirium  precautions  Monitor ammonia and adjust lactulose  as needed    Depression Celexa  and Seroquel .   GERD without esophagitis PPI   Peripheral neuropathy Neurontin .   Dyslipidemia statin     Class 1 obesity based on BMI: Body mass index is 33.45 kg/m.[name] Significantly low or high BMI is associated with higher medical risk.  Underweight - under 18  overweight - 25 to 29 obese - 30 or more Class 1 obesity: BMI of 30.0 to 34 Class 2 obesity: BMI of 35.0 to 39 Class 3 obesity: BMI of 40.0 to 49 Super Morbid Obesity: BMI 50-59 Super-super Morbid Obesity: BMI 60+ Healthy nutrition and physical activity advised as adjunct to other disease management and risk reduction treatments    DVT prophylaxis: SCD given low plt bleed risk  IV fluids: none Nutrition: cardiac diet  Central lines / other devices: none  Code Status: DNR ACP documentation reviewed: has DNR and MOST on file in [facility]  TOC needs: TBD - expect back to group home Medical barriers to dispo: pending 7 days IV abx              Subjective / Brief ROS:  Patient reports feeling fine today no complaints other than would like some ice cream  Denies CP/SOB.  Pain controlled.  Denies new weakness.  Tolerating diet.    Family Communication: none at this time     Objective Findings:  Vitals:   06/29/24 2041 06/30/24 0440 06/30/24  0732 06/30/24 1604  BP: 105/67 109/71 122/67 (!) 108/56  Pulse: (!) 49 (!) 45 (!) 45 (!) 48  Resp: 20 20 16 18   Temp: 98.2 F (36.8 C) 98.3 F (36.8 C) 99.3 F (37.4 C) 99.3 F (37.4 C)  TempSrc:    Oral  SpO2: 100% 99% 98% 97%  Weight:      Height:        Intake/Output Summary (Last 24 hours) at 06/30/2024 1641 Last data filed at 06/29/2024 1900 Gross per 24 hour  Intake 180 ml  Output --  Net 180 ml   Filed Weights   06/26/24 2101  Weight: 94 kg    Examination:  Physical Exam Constitutional:      General: He is not in acute distress. Cardiovascular:      Rate and Rhythm: Normal rate and regular rhythm.  Pulmonary:     Effort: Pulmonary effort is normal.     Breath sounds: Normal breath sounds.  Musculoskeletal:     Right lower leg: No edema.     Left lower leg: No edema.  Skin:    General: Skin is warm and dry.  Neurological:     Mental Status: He is alert.          Scheduled Medications:   aspirin   81 mg Oral Daily   atorvastatin   40 mg Oral Daily   Chlorhexidine  Gluconate Cloth  6 each Topical Daily   citalopram   20 mg Oral Daily   feeding supplement  237 mL Oral TID BM   gabapentin   300 mg Oral BID   guaiFENesin   600 mg Oral BID   lactulose   45 g Oral TID   methocarbamol   500 mg Oral QID   multivitamin with minerals  1 tablet Oral Daily   nystatin  1 Application Topical TID   pantoprazole   40 mg Oral Daily   QUEtiapine   50 mg Oral QHS   rifaximin   550 mg Oral BID   spironolactone   12.5 mg Oral Daily    Continuous Infusions:  cefTRIAXone  (ROCEPHIN )  IV 2 g (06/30/24 0931)    PRN Medications:  acetaminophen  **OR** acetaminophen , albuterol , metoCLOPramide  (REGLAN ) injection, nitroGLYCERIN , phenol, senna, traZODone  Antimicrobials from admission:  Anti-infectives (From admission, onward)    Start     Dose/Rate Route Frequency Ordered Stop   06/27/24 1000  rifaximin  (XIFAXAN ) tablet 550 mg        550 mg Oral 2 times daily 06/27/24 0150     06/27/24 1000  cefTRIAXone  (ROCEPHIN ) 2 g in sodium chloride  0.9 % 100 mL IVPB        2 g 200 mL/hr over 30 Minutes Intravenous Every 24 hours 06/27/24 0150 07/04/24 0959   06/26/24 2115  ceFEPIme  (MAXIPIME ) 1 g in sodium chloride  0.9 % 100 mL IVPB        1 g 200 mL/hr over 30 Minutes Intravenous  Once 06/26/24 2111 06/26/24 2229           Data Reviewed:  I have personally reviewed the following...  CBC: Recent Labs  Lab 06/26/24 2100 06/27/24 0523 06/28/24 0604 06/29/24 0515 06/30/24 0510  WBC 11.5* 9.0 5.8 4.2 3.5*  NEUTROABS 9.8*  --   --   --   --   HGB  9.6* 10.6* 9.4* 9.2* 9.4*  HCT 29.3* 31.9* 28.6* 27.7* 29.5*  MCV 89.1 87.4 88.8 86.8 91.0  PLT 61* 65* 59* 63* 66*   Basic Metabolic Panel: Recent Labs  Lab 06/26/24 2100 06/26/24  2130 06/27/24 0523 06/28/24 0604 06/29/24 0515 06/30/24 0510  NA 130*  --  134* 137 136 133*  K 3.5  --  4.0 4.3 4.3 4.1  CL 100  --  104 104 108 104  CO2 22  --  24 22 23 25   GLUCOSE 118*  --  75 113* 76 88  BUN 41*  --  38* 35* 31* 31*  CREATININE 2.16*  --  2.17* 1.91* 1.72* 1.71*  CALCIUM  8.8*  --  9.1 9.7 9.5 9.7  MG  --  1.7  --   --   --   --    GFR: Estimated Creatinine Clearance: 40.7 mL/min (A) (by C-G formula based on SCr of 1.71 mg/dL (H)). Liver Function Tests: Recent Labs  Lab 06/26/24 2100  AST 53*  ALT 28  ALKPHOS 72  BILITOT 2.1*  PROT 6.4*  ALBUMIN  2.1*   No results for input(s): LIPASE, AMYLASE in the last 168 hours. Recent Labs  Lab 06/26/24 2100 06/28/24 0604  AMMONIA 78* 70*   Coagulation Profile: Recent Labs  Lab 06/26/24 2100 06/27/24 0523  INR 1.5* 1.4*   Cardiac Enzymes: No results for input(s): CKTOTAL, CKMB, CKMBINDEX, TROPONINI in the last 168 hours. BNP (last 3 results) No results for input(s): PROBNP in the last 8760 hours. HbA1C: No results for input(s): HGBA1C in the last 72 hours. CBG: No results for input(s): GLUCAP in the last 168 hours. Lipid Profile: No results for input(s): CHOL, HDL, LDLCALC, TRIG, CHOLHDL, LDLDIRECT in the last 72 hours. Thyroid  Function Tests: No results for input(s): TSH, T4TOTAL, FREET4, T3FREE, THYROIDAB in the last 72 hours. Anemia Panel: No results for input(s): VITAMINB12, FOLATE, FERRITIN, TIBC, IRON, RETICCTPCT in the last 72 hours.  Most Recent Urinalysis On File:     Component Value Date/Time   COLORURINE YELLOW (A) 06/27/2024 0008   APPEARANCEUR HAZY (A) 06/27/2024 0008   LABSPEC 1.018 06/27/2024 0008   PHURINE 7.0 06/27/2024 0008   GLUCOSEU  NEGATIVE 06/27/2024 0008   HGBUR MODERATE (A) 06/27/2024 0008   BILIRUBINUR NEGATIVE 06/27/2024 0008   KETONESUR NEGATIVE 06/27/2024 0008   PROTEINUR 30 (A) 06/27/2024 0008   NITRITE NEGATIVE 06/27/2024 0008   LEUKOCYTESUR LARGE (A) 06/27/2024 0008   Sepsis Labs: @LABRCNTIP (procalcitonin:4,lacticidven:4) Microbiology: Recent Results (from the past 240 hours)  Blood culture (routine x 2)     Status: Abnormal   Collection Time: 06/26/24  9:27 PM   Specimen: BLOOD  Result Value Ref Range Status   Specimen Description   Final    BLOOD BLOOD LEFT ARM Performed at [facility], [address] [place], [place] [address]    Special Requests   Final    BOTTLES DRAWN AEROBIC AND ANAEROBIC Blood Culture results may not be optimal due to an excessive volume of blood received in culture bottles Performed at [facility], [address] [place], [place] [address]    Culture  Setup Time   Final    GRAM NEGATIVE RODS IN BOTH AEROBIC AND ANAEROBIC BOTTLES CRITICAL VALUE NOTED.  VALUE IS CONSISTENT WITH PREVIOUSLY REPORTED AND CALLED VALUE. Performed at [facility], [address] [place], [place] [address]    Culture (A)  Final    KLEBSIELLA OXYTOCA SUSCEPTIBILITIES PERFORMED ON PREVIOUS CULTURE WITHIN THE LAST 5 DAYS. Performed at [facility], [address] [place], [place] [address]    Report Status 06/29/2024 FINAL  Final  Blood culture (routine x 2)     Status:  Abnormal   Collection Time: 06/26/24  9:27 PM   Specimen: BLOOD  Result Value Ref Range Status   Specimen Description   Final    BLOOD BLOOD RIGHT ARM Performed at [facility], [address], [place], [place] [address]    Special Requests   Final    BOTTLES DRAWN AEROBIC AND ANAEROBIC Blood Culture adequate volume Performed at [facility], [address] [place], [place] [address]    Culture  Setup Time   Final    GRAM NEGATIVE RODS IN BOTH AEROBIC AND  ANAEROBIC BOTTLES CRITICAL RESULT CALLED TO, READ BACK BY AND VERIFIED WITH: ALEX CHAPPELL ON 06/27/24 AT 1129 RAM/QSD Performed at [facility], [address], [place], [place] [address]    Culture KLEBSIELLA OXYTOCA (A)  Final   Report Status 06/29/2024 FINAL  Final   Organism ID, Bacteria KLEBSIELLA OXYTOCA  Final      Susceptibility   Klebsiella oxytoca - MIC*    AMPICILLIN >=32 RESISTANT Resistant     CEFEPIME  <=0.12 SENSITIVE Sensitive     ERTAPENEM <=0.12 SENSITIVE Sensitive     CEFTRIAXONE  <=0.25 SENSITIVE Sensitive     CIPROFLOXACIN  1 RESISTANT Resistant     GENTAMICIN <=1 SENSITIVE Sensitive     MEROPENEM  <=0.25 SENSITIVE Sensitive     TRIMETH/SULFA >=320 RESISTANT Resistant     AMPICILLIN/SULBACTAM >=32 RESISTANT Resistant     PIP/TAZO Value in next row Sensitive      16 SENSITIVEThis is a modified FDA-approved test that has been validated and its performance characteristics determined by the reporting laboratory.  This laboratory is certified under the Clinical Laboratory Improvement Amendments CLIA as qualified to perform high complexity clinical laboratory testing.    * KLEBSIELLA OXYTOCA  Blood Culture ID Panel (Reflexed)     Status: Abnormal   Collection Time: 06/26/24  9:27 PM  Result Value Ref Range Status   Enterococcus faecalis NOT DETECTED NOT DETECTED Final   Enterococcus Faecium NOT DETECTED NOT DETECTED Final   Listeria monocytogenes NOT DETECTED NOT DETECTED Final   Staphylococcus species NOT DETECTED NOT DETECTED Final   Staphylococcus aureus (BCID) NOT DETECTED NOT DETECTED Final   Staphylococcus epidermidis NOT DETECTED NOT DETECTED Final   Staphylococcus lugdunensis NOT DETECTED NOT DETECTED Final   Streptococcus species NOT DETECTED NOT DETECTED Final   Streptococcus agalactiae NOT DETECTED NOT DETECTED Final   Streptococcus pneumoniae NOT DETECTED NOT DETECTED Final   Streptococcus pyogenes NOT DETECTED NOT DETECTED Final    A.calcoaceticus-baumannii NOT DETECTED NOT DETECTED Final   Bacteroides fragilis NOT DETECTED NOT DETECTED Final   Enterobacterales DETECTED (A) NOT DETECTED Final    Comment: Enterobacterales represent a large order of gram negative bacteria, not a single organism. CRITICAL RESULT CALLED TO, READ BACK BY AND VERIFIED WITH: ALEX CHAPPELL ON 06/27/24 AT 1129 RAM/QSD    Enterobacter cloacae complex NOT DETECTED NOT DETECTED Final   Escherichia coli NOT DETECTED NOT DETECTED Final   Klebsiella aerogenes NOT DETECTED NOT DETECTED Final   Klebsiella oxytoca DETECTED (A) NOT DETECTED Final    Comment: CRITICAL RESULT CALLED TO, READ BACK BY AND VERIFIED WITH: ALEX CHAPPELL ON 06/27/24 AT 1129 RAM/QSD    Klebsiella pneumoniae NOT DETECTED NOT DETECTED Final   Proteus species NOT DETECTED NOT DETECTED Final   Salmonella species NOT DETECTED NOT DETECTED Final   Serratia marcescens NOT DETECTED NOT DETECTED Final   Haemophilus influenzae NOT DETECTED NOT DETECTED Final   Neisseria meningitidis NOT DETECTED NOT DETECTED  Final   Pseudomonas aeruginosa NOT DETECTED NOT DETECTED Final   Stenotrophomonas maltophilia NOT DETECTED NOT DETECTED Final   Candida albicans NOT DETECTED NOT DETECTED Final   Candida auris NOT DETECTED NOT DETECTED Final   Candida glabrata NOT DETECTED NOT DETECTED Final   Candida krusei NOT DETECTED NOT DETECTED Final   Candida parapsilosis NOT DETECTED NOT DETECTED Final   Candida tropicalis NOT DETECTED NOT DETECTED Final   Cryptococcus neoformans/gattii NOT DETECTED NOT DETECTED Final   CTX-M ESBL NOT DETECTED NOT DETECTED Final   Carbapenem resistance IMP NOT DETECTED NOT DETECTED Final   Carbapenem resistance KPC NOT DETECTED NOT DETECTED Final   Carbapenem resistance NDM NOT DETECTED NOT DETECTED Final   Carbapenem resist OXA 48 LIKE NOT DETECTED NOT DETECTED Final   Carbapenem resistance VIM NOT DETECTED NOT DETECTED Final    Comment: Performed at [facility], [address] [place], [place] [address]  Resp panel by RT-PCR (RSV, Flu A&B, Covid) Anterior Nasal Swab     Status: None   Collection Time: 06/26/24 11:16 PM   Specimen: Anterior Nasal Swab  Result Value Ref Range Status   SARS Coronavirus 2 by RT PCR NEGATIVE NEGATIVE Final    Comment: (NOTE) SARS-CoV-2 target nucleic acids are NOT DETECTED.  The SARS-CoV-2 RNA is generally detectable in upper respiratory specimens during the acute phase of infection. The lowest concentration of SARS-CoV-2 viral copies this assay can detect is 138 copies/mL. A negative result does not preclude SARS-Cov-2 infection and should not be used as the sole basis for treatment or other patient management decisions. A negative result may occur with  improper specimen collection/handling, submission of specimen other than nasopharyngeal swab, presence of viral mutation(s) within the areas targeted by this assay, and inadequate number of viral copies(<138 copies/mL). A negative result must be combined with clinical observations, patient history, and epidemiological information. The expected result is Negative.  Fact Sheet for Patients:  bloggercourse.com  Fact Sheet for Healthcare Providers:  seriousbroker.it  This test is no t yet approved or cleared by the [place]  FDA and  has been authorized for detection and/or diagnosis of SARS-CoV-2 by FDA under an Emergency Use Authorization (EUA). This EUA will remain  in effect (meaning this test can be used) for the duration of the COVID-19 declaration under Section 564(b)(1) of the Act, 21 U.S.C.section 360bbb-3(b)(1), unless the authorization is terminated  or revoked sooner.       Influenza A by PCR NEGATIVE NEGATIVE Final   Influenza B by PCR NEGATIVE NEGATIVE Final    Comment: (NOTE) The Xpert Xpress SARS-CoV-2/FLU/RSV plus assay is intended as an aid in the diagnosis of influenza from  Nasopharyngeal swab specimens and should not be used as a sole basis for treatment. Nasal washings and aspirates are unacceptable for Xpert Xpress SARS-CoV-2/FLU/RSV testing.  Fact Sheet for Patients: bloggercourse.com  Fact Sheet for Healthcare Providers: seriousbroker.it  This test is not yet approved or cleared by the [place]  FDA and has been authorized for detection and/or diagnosis of SARS-CoV-2 by FDA under an Emergency Use Authorization (EUA). This EUA will remain in effect (meaning this test can be used) for the duration of the COVID-19 declaration under Section 564(b)(1) of the Act, 21 U.S.C. section 360bbb-3(b)(1), unless the authorization is terminated or revoked.     Resp Syncytial Virus by PCR NEGATIVE NEGATIVE Final    Comment: (NOTE) Fact Sheet for Patients: bloggercourse.com  Fact Sheet for Healthcare Providers: seriousbroker.it  This test is  not yet approved or cleared by the [place]  FDA and has been authorized for detection and/or diagnosis of SARS-CoV-2 by FDA under an Emergency Use Authorization (EUA). This EUA will remain in effect (meaning this test can be used) for the duration of the COVID-19 declaration under Section 564(b)(1) of the Act, 21 U.S.C. section 360bbb-3(b)(1), unless the authorization is terminated or revoked.  Performed at [facility], [address] [place], [place] [address]   Urine Culture     Status: Abnormal   Collection Time: 06/27/24 12:08 AM   Specimen: Urine, Catheterized  Result Value Ref Range Status   Specimen Description   Final    URINE, CATHETERIZED Performed at [facility], [address] [place], [place] [address]    Special Requests   Final    NONE Performed at [facility], [address] [place], [place] [address]    Culture (A)  Final    >=100,000 COLONIES/mL  KLEBSIELLA OXYTOCA 40,000 COLONIES/mL PROTEUS MIRABILIS >=100,000 COLONIES/mL ENTEROCOCCUS FAECALIS    Report Status 06/30/2024 FINAL  Final   Organism ID, Bacteria ENTEROCOCCUS FAECALIS (A)  Final   Organism ID, Bacteria KLEBSIELLA OXYTOCA (A)  Final   Organism ID, Bacteria PROTEUS MIRABILIS (A)  Final      Susceptibility   Enterococcus faecalis - MIC*    AMPICILLIN <=2 SENSITIVE Sensitive     NITROFURANTOIN <=16 SENSITIVE Sensitive     VANCOMYCIN  <=0.5 SENSITIVE Sensitive     * >=100,000 COLONIES/mL ENTEROCOCCUS FAECALIS   Klebsiella oxytoca - MIC*    AMPICILLIN >=32 RESISTANT Resistant     CEFEPIME  <=0.12 SENSITIVE Sensitive     ERTAPENEM <=0.12 SENSITIVE Sensitive     CEFTRIAXONE  <=0.25 SENSITIVE Sensitive     CIPROFLOXACIN  1 RESISTANT Resistant     GENTAMICIN <=1 SENSITIVE Sensitive     NITROFURANTOIN <=16 SENSITIVE Sensitive     TRIMETH/SULFA >=320 RESISTANT Resistant     AMPICILLIN/SULBACTAM >=32 RESISTANT Resistant     PIP/TAZO Value in next row Sensitive      16 SENSITIVEThis is a modified FDA-approved test that has been validated and its performance characteristics determined by the reporting laboratory.  This laboratory is certified under the Clinical Laboratory Improvement Amendments CLIA as qualified to perform high complexity clinical laboratory testing.    MEROPENEM  Value in next row Sensitive      16 SENSITIVEThis is a modified FDA-approved test that has been validated and its performance characteristics determined by the reporting laboratory.  This laboratory is certified under the Clinical Laboratory Improvement Amendments CLIA as qualified to perform high complexity clinical laboratory testing.    * >=100,000 COLONIES/mL KLEBSIELLA OXYTOCA   Proteus mirabilis - MIC*    AMPICILLIN Value in next row Resistant      16 SENSITIVEThis is a modified FDA-approved test that has been validated and its performance characteristics determined by the reporting laboratory.  This  laboratory is certified under the Clinical Laboratory Improvement Amendments CLIA as qualified to perform high complexity clinical laboratory testing.    CEFAZOLIN  (URINE) Value in next row Sensitive      4 SENSITIVEThis is a modified FDA-approved test that has been validated and its performance characteristics determined by the reporting laboratory.  This laboratory is certified under the Clinical Laboratory Improvement Amendments CLIA as qualified to perform high complexity clinical laboratory testing.    CEFEPIME  Value in next row Sensitive      4 SENSITIVEThis is a modified FDA-approved test that has been validated and its  performance characteristics determined by the reporting laboratory.  This laboratory is certified under the Clinical Laboratory Improvement Amendments CLIA as qualified to perform high complexity clinical laboratory testing.    ERTAPENEM Value in next row Sensitive      4 SENSITIVEThis is a modified FDA-approved test that has been validated and its performance characteristics determined by the reporting laboratory.  This laboratory is certified under the Clinical Laboratory Improvement Amendments CLIA as qualified to perform high complexity clinical laboratory testing.    CEFTRIAXONE  Value in next row Sensitive      4 SENSITIVEThis is a modified FDA-approved test that has been validated and its performance characteristics determined by the reporting laboratory.  This laboratory is certified under the Clinical Laboratory Improvement Amendments CLIA as qualified to perform high complexity clinical laboratory testing.    CIPROFLOXACIN  Value in next row Resistant      4 SENSITIVEThis is a modified FDA-approved test that has been validated and its performance characteristics determined by the reporting laboratory.  This laboratory is certified under the Clinical Laboratory Improvement Amendments CLIA as qualified to perform high complexity clinical laboratory testing.    GENTAMICIN Value  in next row Sensitive      4 SENSITIVEThis is a modified FDA-approved test that has been validated and its performance characteristics determined by the reporting laboratory.  This laboratory is certified under the Clinical Laboratory Improvement Amendments CLIA as qualified to perform high complexity clinical laboratory testing.    NITROFURANTOIN Value in next row Resistant      4 SENSITIVEThis is a modified FDA-approved test that has been validated and its performance characteristics determined by the reporting laboratory.  This laboratory is certified under the Clinical Laboratory Improvement Amendments CLIA as qualified to perform high complexity clinical laboratory testing.    TRIMETH/SULFA Value in next row Resistant      4 SENSITIVEThis is a modified FDA-approved test that has been validated and its performance characteristics determined by the reporting laboratory.  This laboratory is certified under the Clinical Laboratory Improvement Amendments CLIA as qualified to perform high complexity clinical laboratory testing.    AMPICILLIN/SULBACTAM Value in next row Sensitive      4 SENSITIVEThis is a modified FDA-approved test that has been validated and its performance characteristics determined by the reporting laboratory.  This laboratory is certified under the Clinical Laboratory Improvement Amendments CLIA as qualified to perform high complexity clinical laboratory testing.    PIP/TAZO Value in next row Sensitive      <=4 SENSITIVEThis is a modified FDA-approved test that has been validated and its performance characteristics determined by the reporting laboratory.  This laboratory is certified under the Clinical Laboratory Improvement Amendments CLIA as qualified to perform high complexity clinical laboratory testing.    MEROPENEM  Value in next row Sensitive      <=4 SENSITIVEThis is a modified FDA-approved test that has been validated and its performance characteristics determined by the  reporting laboratory.  This laboratory is certified under the Clinical Laboratory Improvement Amendments CLIA as qualified to perform high complexity clinical laboratory testing.    * 40,000 COLONIES/mL PROTEUS MIRABILIS      Radiology Studies last 3 days: ECHOCARDIOGRAM COMPLETE Result Date: 06/30/2024    ECHOCARDIOGRAM REPORT   Patient Name:   [name] Date of Exam: 06/30/2024 Medical Rec #:  [id number]      Height: Accession #:    [id number]     Weight: Date of Birth:  05-Dec-1950     BSA: Patient Age:  72 years       BP:           122/67 mmHg Patient Gender: M              HR:           45 bpm. Exam Location:  [facility] Procedure: 2D Echo, Cardiac Doppler, Color Doppler, 3D Echo and Strain Analysis            (Both Spectral and Color Flow Doppler were utilized during            procedure). Indications:     Bacteremia R78.81  History:         Patient has prior history of Echocardiogram examinations, most                  recent 12/24/2022. CAD. Chest pain.  Sonographer:     Christopher Furnace Referring Phys:  [id number] LANETA BLUNT Diagnosing Phys: Lonni Hanson MD  Sonographer Comments: Global longitudinal strain was attempted. IMPRESSIONS  1. Left ventricular ejection fraction, by estimation, is 55 to 60%. The left ventricle has normal function. The left ventricle has no regional wall motion abnormalities. There is mild left ventricular hypertrophy. Left ventricular diastolic parameters were normal. The average left ventricular global longitudinal strain is -18.1 %. The global longitudinal strain is normal.  2. Right ventricular systolic function is normal. The right ventricular size is normal. Tricuspid regurgitation signal is inadequate for assessing PA pressure.  3. Left atrial size was mildly dilated.  4. The mitral valve is normal in structure. Trivial mitral valve regurgitation. No evidence of mitral stenosis.  5. The aortic valve is tricuspid. Aortic valve regurgitation is not visualized. No aortic  stenosis is present. FINDINGS  Left Ventricle: Left ventricular ejection fraction, by estimation, is 55 to 60%. The left ventricle has normal function. The left ventricle has no regional wall motion abnormalities. The average left ventricular global longitudinal strain is -18.1 %. Strain was performed and the global longitudinal strain is normal. The left ventricular internal cavity size was normal in size. There is mild left ventricular hypertrophy. Left ventricular diastolic parameters were normal. Right Ventricle: The right ventricular size is normal. No increase in right ventricular wall thickness. Right ventricular systolic function is normal. Tricuspid regurgitation signal is inadequate for assessing PA pressure. Left Atrium: Left atrial size was mildly dilated. Right Atrium: Right atrial size was normal in size. Pericardium: The pericardium was not well visualized. Mitral Valve: The mitral valve is normal in structure. Trivial mitral valve regurgitation. No evidence of mitral valve stenosis. MV peak gradient, 3.9 mmHg. The mean mitral valve gradient is 1.0 mmHg. Tricuspid Valve: The tricuspid valve is grossly normal. Tricuspid valve regurgitation is trivial. Aortic Valve: The aortic valve is tricuspid. Aortic valve regurgitation is not visualized. No aortic stenosis is present. Aortic valve mean gradient measures 4.0 mmHg. Aortic valve peak gradient measures 8.5 mmHg. Aortic valve area, by VTI measures 3.10 cm. Pulmonic Valve: The pulmonic valve was not well visualized. Pulmonic valve regurgitation is not visualized. No evidence of pulmonic stenosis. Aorta: The aortic root is normal in size and structure. Pulmonary Artery: The pulmonary artery is of normal size. IAS/Shunts: The interatrial septum was not well visualized.  LEFT VENTRICLE PLAX 2D LVIDd:         5.30 cm   Diastology LVIDs:         3.60 cm   LV e' medial:    14.00 cm/s LV PW:  1.30 cm   LV E/e' medial:  6.8 LV IVS:        1.30 cm   LV e'  lateral:   14.90 cm/s LVOT diam:     2.20 cm   LV E/e' lateral: 6.4 LV SV:         86 LVOT Area:     3.80 cm  2D Longitudinal Strain LV IVRT:       98 msec   2D Strain GLS Avg:     -18.1 %  RIGHT VENTRICLE RV Basal diam:  3.60 cm RV Mid diam:    3.40 cm RV S prime:     17.40 cm/s LEFT ATRIUM           RIGHT ATRIUM LA diam:      4.20 cm RA Area:     17.60 cm LA Vol (A2C): 54.7 ml RA Volume:   39.30 ml LA Vol (A4C): 54.6 ml  AORTIC VALVE AV Area (Vmax):    2.99 cm AV Area (Vmean):   2.90 cm AV Area (VTI):     3.10 cm AV Vmax:           146.00 cm/s AV Vmean:          93.600 cm/s AV VTI:            0.276 m AV Peak Grad:      8.5 mmHg AV Mean Grad:      4.0 mmHg LVOT Vmax:         115.00 cm/s LVOT Vmean:        71.300 cm/s LVOT VTI:          0.225 m LVOT/AV VTI ratio: 0.82  AORTA Ao Root diam: 3.00 cm MITRAL VALVE MV Area (PHT): 2.17 cm    SHUNTS MV Area VTI:   1.95 cm    Systemic VTI:  0.22 m MV Peak grad:  3.9 mmHg    Systemic Diam: 2.20 cm MV Mean grad:  1.0 mmHg MV Vmax:       0.98 m/s MV Vmean:      55.7 cm/s MV Decel Time: 350 msec MV E velocity: 95.40 cm/s MV A velocity: 60.30 cm/s MV E/A ratio:  1.58 Lonni End MD Electronically signed by Lonni Hanson MD Signature Date/Time: 06/30/2024/3:43:07 PM    Final    DG Chest Portable 1 View Result Date: 06/26/2024 CLINICAL DATA:  Short of breath, confusion, sepsis EXAM: PORTABLE CHEST 1 VIEW COMPARISON:  02/17/2024 FINDINGS: Single frontal view of the chest demonstrates a stable cardiac silhouette. No acute airspace disease, effusion, or pneumothorax. No acute bony abnormalities. IMPRESSION: 1. No acute intrathoracic process. Electronically Signed   By: Ozell Daring M.D.   On: 06/26/2024 22:22       Kees Idrovo, DO [facility] 06/30/2024, 4:41 PM    Dictation software may have been used to generate the above note. Typos may occur and escape review in typed/dictated notes. Please contact Dr Marsa directly for clarity if  needed.  Staff may message me via secure chat in Epic  but this may not receive an immediate response,  please page me for urgent matters!  If 7PM-7AM, please contact night coverage [email or web address]

## 2024-06-30 NOTE — TOC Progression Note (Signed)
 Transition of Care [facility]) - Progression Note    Patient Details  Name: [name] MRN: [id number] Date of Birth: [birth year]  [facility] CM/SW Contact  Dalia GORMAN Fuse, RN Phone Number: 06/30/2024, 3:01 PM  Clinical Narrative:    TOC spoke with [name] at [facility] to see if he could support the patient discharging back to the group home with IV abx. They can not administer IV abx in the group home setting. Patient is on the [place] Sex offender list, he is not appropriate for SNF.  TOC will continue to follow   Expected Discharge Plan: Group Home Barriers to Discharge: Continued Medical Work up               Expected Discharge Plan and Services       Living arrangements for the past 2 months: Group Home                                       Social Drivers of Health (SDOH) Interventions SDOH Screenings   Food Insecurity: No Food Insecurity (06/28/2024)  Housing: Low Risk  (06/29/2024)  Transportation Needs: No Transportation Needs (06/28/2024)  Utilities: Not At Risk (06/28/2024)  Alcohol Screen: Low Risk  (09/24/2022)  Depression (PHQ2-9): High Risk (04/26/2022)  Financial Resource Strain: High Risk (04/27/2022)  Physical Activity: Sufficiently Active (04/16/2018)  Social Connections: Socially Isolated (06/28/2024)  Stress: Stress Concern Present (04/16/2018)  Tobacco Use: High Risk (06/29/2024)    Readmission Risk Interventions    11/29/2023    5:03 PM 11/13/2023    2:21 PM 11/12/2023    4:00 PM  Readmission Risk Prevention Plan  Transportation Screening Not Complete  Not Complete  Medication Review Oceanographer) Not Complete Complete Not Complete  PCP or Specialist appointment within 3-5 days of discharge Not Complete Complete Complete  HRI or Home Care Consult Not Complete Complete   SW Recovery Care/Counseling Consult Not Complete Complete   Palliative Care Screening Not Applicable Complete   Skilled Nursing Facility Not Applicable Not  Applicable

## 2024-06-30 NOTE — Plan of Care (Signed)
  Problem: Education: Goal: Knowledge of General Education information will improve Description: Including pain rating scale, medication(s)/side effects and non-pharmacologic comfort measures Outcome: Progressing   Problem: Clinical Measurements: Goal: Cardiovascular complication will be avoided Outcome: Progressing   Problem: Coping: Goal: Level of anxiety will decrease Outcome: Progressing   Problem: Pain Managment: Goal: General experience of comfort will improve and/or be controlled Outcome: Progressing   Problem: Safety: Goal: Ability to remain free from injury will improve Outcome: Progressing   Problem: Skin Integrity: Goal: Risk for impaired skin integrity will decrease Outcome: Progressing

## 2024-06-30 NOTE — Progress Notes (Signed)
*  PRELIMINARY RESULTS* Echocardiogram 2D Echocardiogram has been performed.  Floydene Harder 06/30/2024, 2:32 PM

## 2024-07-01 DIAGNOSIS — A4150 Gram-negative sepsis, unspecified: Secondary | ICD-10-CM | POA: Diagnosis not present

## 2024-07-01 DIAGNOSIS — N390 Urinary tract infection, site not specified: Secondary | ICD-10-CM | POA: Diagnosis not present

## 2024-07-01 NOTE — Progress Notes (Incomplete)
 PROGRESS NOTE    [name]  FMW:[id number] DOB: 18-Jan-1951 DOA: 06/26/2024 PCP: [facility]    Brief Narrative:        Assessment and Plan:  Assessment and Plan: * Sepsis due to gram-negative UTI [facility]) - The patient will be admitted to a vestibular bed. - Will continue antibiotic therapy with IV Rocephin . - He will be hydrated with IV lactated ringer . - Sepsis is manifested by fever 100.8 and tachypnea of 22. - Will follow blood and urine cultures.  Hyponatremia - This is likely hypovolemic. - The patient will be hydrated and will follow BMP  Acute on chronic anemia - Will obtain anemia workup. - Will monitor H&H.  Acute hepatic encephalopathy (HCC) - He will be placed on lactulose . - Will continue rifaximin  and Aldactone  given his history of post hepatic cirrhosis.  Depression - Will continue Celexa  and Seroquel .  GERD without esophagitis - Will continue PPI therapy.  Peripheral neuropathy - We will continue Neurontin .  Dyslipidemia - Continue statin therapy.   # Sepsis secondary to bacteremia and UTI # Klebsiella oxytoca bacteremia in the setting of complicated UTI with indwelling foley catheter # Chronic indwelling foley catheter - Presented with fever to 100.8 and tachypnea to 22 - UCx positive for Klebsiella oxytoca, Proteus mirabilis, Enterococcus faecalis - BCx (10/31) positive for Klebsiella oxytoca  - ID following - recommending 7 days of IV antibiotics given resistance pattern of Klebsiella oxytoca - On IV Rocephin  for 7 days total with end date of 07/04/2024    DVT prophylaxis: Place and maintain sequential compression device Start: 06/27/24 0904  Code Status:   Code Status: Limited: Do not attempt resuscitation (DNR) -DNR-LIMITED -Do Not Intubate/DNI  Family Communication: Disposition Plan:  Level of care: Med-Surg  Consultants:  ***  Procedures:  ***  Antimicrobials: ***     Subjective: ***  Objective: Vitals:   06/30/24 2017 07/01/24 0506 07/01/24 0944 07/01/24 2020  BP: 117/62 132/62 (!) 152/68 (!) 119/59  Pulse: (!) 47 (!) 47 (!) 48 (!) 45  Resp: 20 20 18    Temp: 98.8 F (37.1 C)  97.8 F (36.6 C) 98.8 F (37.1 C)  TempSrc: Oral   Oral  SpO2: 99% 97% 100% 99%  Weight:      Height:        Intake/Output Summary (Last 24 hours) at 07/01/2024 2343 Last data filed at 07/01/2024 1300 Gross per 24 hour  Intake 0 ml  Output 900 ml  Net -900 ml   Filed Weights   06/26/24 2101  Weight: 94 kg    Examination:  General exam: Appears calm and comfortable  Respiratory system: Clear to auscultation. Respiratory effort normal. Cardiovascular system: S1 & S2 heard, RRR. No JVD, murmurs, rubs, gallops or clicks. No pedal edema. Gastrointestinal system: Abdomen is nondistended, soft and nontender. No organomegaly or masses felt. Normal bowel sounds heard. Central nervous system: Alert and oriented. No focal neurological deficits. Extremities: Symmetric 5 x 5 power. Skin: No rashes, lesions or ulcers Psychiatry: Judgement and insight appear normal. Mood & affect appropriate.     Data Reviewed: I have personally reviewed following labs and imaging studies  CBC: Recent Labs  Lab 06/26/24 2100 06/27/24 0523 06/28/24 0604 06/29/24 0515 06/30/24 0510  WBC 11.5* 9.0 5.8 4.2 3.5*  NEUTROABS 9.8*  --   --   --   --   HGB 9.6* 10.6* 9.4* 9.2* 9.4*  HCT 29.3* 31.9* 28.6* 27.7* 29.5*  MCV 89.1 87.4 88.8 86.8  91.0  PLT 61* 65* 59* 63* 66*   Basic Metabolic Panel: Recent Labs  Lab 06/26/24 2100 06/26/24 2130 06/27/24 0523 06/28/24 0604 06/29/24 0515 06/30/24 0510  NA 130*  --  134* 137 136 133*  K 3.5  --  4.0 4.3 4.3 4.1  CL 100  --  104 104 108 104  CO2 22  --  24 22 23 25   GLUCOSE 118*  --  75 113* 76 88  BUN 41*  --  38* 35* 31* 31*  CREATININE 2.16*  --  2.17* 1.91* 1.72* 1.71*  CALCIUM  8.8*  --  9.1 9.7 9.5 9.7  MG  --  1.7  --    --   --   --    GFR: Estimated Creatinine Clearance: 40.7 mL/min (A) (by C-G formula based on SCr of 1.71 mg/dL (H)). Liver Function Tests: Recent Labs  Lab 06/26/24 2100  AST 53*  ALT 28  ALKPHOS 72  BILITOT 2.1*  PROT 6.4*  ALBUMIN  2.1*   No results for input(s): LIPASE, AMYLASE in the last 168 hours. Recent Labs  Lab 06/26/24 2100 06/28/24 0604  AMMONIA 78* 70*   Coagulation Profile: Recent Labs  Lab 06/26/24 2100 06/27/24 0523  INR 1.5* 1.4*   Cardiac Enzymes: No results for input(s): CKTOTAL, CKMB, CKMBINDEX, TROPONINI in the last 168 hours. BNP (last 3 results) No results for input(s): PROBNP in the last 8760 hours. HbA1C: No results for input(s): HGBA1C in the last 72 hours. CBG: No results for input(s): GLUCAP in the last 168 hours. Lipid Profile: No results for input(s): CHOL, HDL, LDLCALC, TRIG, CHOLHDL, LDLDIRECT in the last 72 hours. Thyroid  Function Tests: No results for input(s): TSH, T4TOTAL, FREET4, T3FREE, THYROIDAB in the last 72 hours. Anemia Panel: No results for input(s): VITAMINB12, FOLATE, FERRITIN, TIBC, IRON, RETICCTPCT in the last 72 hours. Sepsis Labs: Recent Labs  Lab 06/26/24 2100 06/26/24 2316  LATICACIDVEN 1.8 1.6    Recent Results (from the past 240 hours)  Blood culture (routine x 2)     Status: Abnormal   Collection Time: 06/26/24  9:27 PM   Specimen: BLOOD  Result Value Ref Range Status   Specimen Description   Final    BLOOD BLOOD LEFT ARM Performed at [facility], [address], [place], [place] [address]    Special Requests   Final    BOTTLES DRAWN AEROBIC AND ANAEROBIC Blood Culture results may not be optimal due to an excessive volume of blood received in culture bottles Performed at [facility], [address], [place], [place] [address]    Culture  Setup Time   Final    GRAM NEGATIVE RODS IN BOTH AEROBIC AND ANAEROBIC BOTTLES CRITICAL  VALUE NOTED.  VALUE IS CONSISTENT WITH PREVIOUSLY REPORTED AND CALLED VALUE. Performed at [facility], [address] [place], [place] [address]    Culture (A)  Final    KLEBSIELLA OXYTOCA SUSCEPTIBILITIES PERFORMED ON PREVIOUS CULTURE WITHIN THE LAST 5 DAYS. Performed at [facility], [address] [place], [place] [address]    Report Status 06/29/2024 FINAL  Final  Blood culture (routine x 2)     Status: Abnormal   Collection Time: 06/26/24  9:27 PM   Specimen: BLOOD  Result Value Ref Range Status   Specimen Description   Final    BLOOD BLOOD RIGHT ARM Performed at [facility], [address] [place], [place] [address]    Special Requests  Final    BOTTLES DRAWN AEROBIC AND ANAEROBIC Blood Culture adequate volume Performed at [facility], [address] [place], [place] [address]    Culture  Setup Time   Final    GRAM NEGATIVE RODS IN BOTH AEROBIC AND ANAEROBIC BOTTLES CRITICAL RESULT CALLED TO, READ BACK BY AND VERIFIED WITH: ALEX CHAPPELL ON 06/27/24 AT 1129 RAM/QSD Performed at [facility], [address], [place], [place] [address]    Culture KLEBSIELLA OXYTOCA (A)  Final   Report Status 06/29/2024 FINAL  Final   Organism ID, Bacteria KLEBSIELLA OXYTOCA  Final      Susceptibility   Klebsiella oxytoca - MIC*    AMPICILLIN >=32 RESISTANT Resistant     CEFEPIME  <=0.12 SENSITIVE Sensitive     ERTAPENEM <=0.12 SENSITIVE Sensitive     CEFTRIAXONE  <=0.25 SENSITIVE Sensitive     CIPROFLOXACIN  1 RESISTANT Resistant     GENTAMICIN <=1 SENSITIVE Sensitive     MEROPENEM  <=0.25 SENSITIVE Sensitive     TRIMETH/SULFA >=320 RESISTANT Resistant     AMPICILLIN/SULBACTAM >=32 RESISTANT Resistant     PIP/TAZO Value in next row Sensitive      16 SENSITIVEThis is a modified FDA-approved test that has been validated and its performance characteristics determined by the reporting laboratory.  This laboratory is certified under the Clinical  Laboratory Improvement Amendments CLIA as qualified to perform high complexity clinical laboratory testing.    * KLEBSIELLA OXYTOCA  Blood Culture ID Panel (Reflexed)     Status: Abnormal   Collection Time: 06/26/24  9:27 PM  Result Value Ref Range Status   Enterococcus faecalis NOT DETECTED NOT DETECTED Final   Enterococcus Faecium NOT DETECTED NOT DETECTED Final   Listeria monocytogenes NOT DETECTED NOT DETECTED Final   Staphylococcus species NOT DETECTED NOT DETECTED Final   Staphylococcus aureus (BCID) NOT DETECTED NOT DETECTED Final   Staphylococcus epidermidis NOT DETECTED NOT DETECTED Final   Staphylococcus lugdunensis NOT DETECTED NOT DETECTED Final   Streptococcus species NOT DETECTED NOT DETECTED Final   Streptococcus agalactiae NOT DETECTED NOT DETECTED Final   Streptococcus pneumoniae NOT DETECTED NOT DETECTED Final   Streptococcus pyogenes NOT DETECTED NOT DETECTED Final   A.calcoaceticus-baumannii NOT DETECTED NOT DETECTED Final   Bacteroides fragilis NOT DETECTED NOT DETECTED Final   Enterobacterales DETECTED (A) NOT DETECTED Final    Comment: Enterobacterales represent a large order of gram negative bacteria, not a single organism. CRITICAL RESULT CALLED TO, READ BACK BY AND VERIFIED WITH: ALEX CHAPPELL ON 06/27/24 AT 1129 RAM/QSD    Enterobacter cloacae complex NOT DETECTED NOT DETECTED Final   Escherichia coli NOT DETECTED NOT DETECTED Final   Klebsiella aerogenes NOT DETECTED NOT DETECTED Final   Klebsiella oxytoca DETECTED (A) NOT DETECTED Final    Comment: CRITICAL RESULT CALLED TO, READ BACK BY AND VERIFIED WITH: ALEX CHAPPELL ON 06/27/24 AT 1129 RAM/QSD    Klebsiella pneumoniae NOT DETECTED NOT DETECTED Final   Proteus species NOT DETECTED NOT DETECTED Final   Salmonella species NOT DETECTED NOT DETECTED Final   Serratia marcescens NOT DETECTED NOT DETECTED Final   Haemophilus influenzae NOT DETECTED NOT DETECTED Final   Neisseria meningitidis NOT DETECTED NOT  DETECTED Final   Pseudomonas aeruginosa NOT DETECTED NOT DETECTED Final   Stenotrophomonas maltophilia NOT DETECTED NOT DETECTED Final   Candida albicans NOT DETECTED NOT DETECTED Final   Candida auris NOT DETECTED NOT DETECTED Final   Candida glabrata NOT DETECTED NOT DETECTED Final   Candida krusei NOT DETECTED  NOT DETECTED Final   Candida parapsilosis NOT DETECTED NOT DETECTED Final   Candida tropicalis NOT DETECTED NOT DETECTED Final   Cryptococcus neoformans/gattii NOT DETECTED NOT DETECTED Final   CTX-M ESBL NOT DETECTED NOT DETECTED Final   Carbapenem resistance IMP NOT DETECTED NOT DETECTED Final   Carbapenem resistance KPC NOT DETECTED NOT DETECTED Final   Carbapenem resistance NDM NOT DETECTED NOT DETECTED Final   Carbapenem resist OXA 48 LIKE NOT DETECTED NOT DETECTED Final   Carbapenem resistance VIM NOT DETECTED NOT DETECTED Final    Comment: Performed at [facility], [address] [place], [place] [address]  Resp panel by RT-PCR (RSV, Flu A&B, Covid) Anterior Nasal Swab     Status: None   Collection Time: 06/26/24 11:16 PM   Specimen: Anterior Nasal Swab  Result Value Ref Range Status   SARS Coronavirus 2 by RT PCR NEGATIVE NEGATIVE Final    Comment: (NOTE) SARS-CoV-2 target nucleic acids are NOT DETECTED.  The SARS-CoV-2 RNA is generally detectable in upper respiratory specimens during the acute phase of infection. The lowest concentration of SARS-CoV-2 viral copies this assay can detect is 138 copies/mL. A negative result does not preclude SARS-Cov-2 infection and should not be used as the sole basis for treatment or other patient management decisions. A negative result may occur with  improper specimen collection/handling, submission of specimen other than nasopharyngeal swab, presence of viral mutation(s) within the areas targeted by this assay, and inadequate number of viral copies(<138 copies/mL). A negative result must be combined with clinical  observations, patient history, and epidemiological information. The expected result is Negative.  Fact Sheet for Patients:  bloggercourse.com  Fact Sheet for Healthcare Providers:  seriousbroker.it  This test is no t yet approved or cleared by the [place]  FDA and  has been authorized for detection and/or diagnosis of SARS-CoV-2 by FDA under an Emergency Use Authorization (EUA). This EUA will remain  in effect (meaning this test can be used) for the duration of the COVID-19 declaration under Section 564(b)(1) of the Act, 21 U.S.C.section 360bbb-3(b)(1), unless the authorization is terminated  or revoked sooner.       Influenza A by PCR NEGATIVE NEGATIVE Final   Influenza B by PCR NEGATIVE NEGATIVE Final    Comment: (NOTE) The Xpert Xpress SARS-CoV-2/FLU/RSV plus assay is intended as an aid in the diagnosis of influenza from Nasopharyngeal swab specimens and should not be used as a sole basis for treatment. Nasal washings and aspirates are unacceptable for Xpert Xpress SARS-CoV-2/FLU/RSV testing.  Fact Sheet for Patients: bloggercourse.com  Fact Sheet for Healthcare Providers: seriousbroker.it  This test is not yet approved or cleared by the [place]  FDA and has been authorized for detection and/or diagnosis of SARS-CoV-2 by FDA under an Emergency Use Authorization (EUA). This EUA will remain in effect (meaning this test can be used) for the duration of the COVID-19 declaration under Section 564(b)(1) of the Act, 21 U.S.C. section 360bbb-3(b)(1), unless the authorization is terminated or revoked.     Resp Syncytial Virus by PCR NEGATIVE NEGATIVE Final    Comment: (NOTE) Fact Sheet for Patients: bloggercourse.com  Fact Sheet for Healthcare Providers: seriousbroker.it  This test is not yet approved or cleared by  the [place]  FDA and has been authorized for detection and/or diagnosis of SARS-CoV-2 by FDA under an Emergency Use Authorization (EUA). This EUA will remain in effect (meaning this test can be used) for the duration of the COVID-19 declaration under Section 564(b)(1) of the  Act, 21 U.S.C. section 360bbb-3(b)(1), unless the authorization is terminated or revoked.  Performed at [facility], [address] [place], [place] [address]   Urine Culture     Status: Abnormal   Collection Time: 06/27/24 12:08 AM   Specimen: Urine, Catheterized  Result Value Ref Range Status   Specimen Description   Final    URINE, CATHETERIZED Performed at [facility], [address] [place], [place] [address]    Special Requests   Final    NONE Performed at [facility], [address] [place], [place] [address]    Culture (A)  Final    >=100,000 COLONIES/mL KLEBSIELLA OXYTOCA 40,000 COLONIES/mL PROTEUS MIRABILIS >=100,000 COLONIES/mL ENTEROCOCCUS FAECALIS    Report Status 06/30/2024 FINAL  Final   Organism ID, Bacteria ENTEROCOCCUS FAECALIS (A)  Final   Organism ID, Bacteria KLEBSIELLA OXYTOCA (A)  Final   Organism ID, Bacteria PROTEUS MIRABILIS (A)  Final      Susceptibility   Enterococcus faecalis - MIC*    AMPICILLIN <=2 SENSITIVE Sensitive     NITROFURANTOIN <=16 SENSITIVE Sensitive     VANCOMYCIN  <=0.5 SENSITIVE Sensitive     * >=100,000 COLONIES/mL ENTEROCOCCUS FAECALIS   Klebsiella oxytoca - MIC*    AMPICILLIN >=32 RESISTANT Resistant     CEFEPIME  <=0.12 SENSITIVE Sensitive     ERTAPENEM <=0.12 SENSITIVE Sensitive     CEFTRIAXONE  <=0.25 SENSITIVE Sensitive     CIPROFLOXACIN  1 RESISTANT Resistant     GENTAMICIN <=1 SENSITIVE Sensitive     NITROFURANTOIN <=16 SENSITIVE Sensitive     TRIMETH/SULFA >=320 RESISTANT Resistant     AMPICILLIN/SULBACTAM >=32 RESISTANT Resistant     PIP/TAZO Value in next row Sensitive      16 SENSITIVEThis is a modified  FDA-approved test that has been validated and its performance characteristics determined by the reporting laboratory.  This laboratory is certified under the Clinical Laboratory Improvement Amendments CLIA as qualified to perform high complexity clinical laboratory testing.    MEROPENEM  Value in next row Sensitive      16 SENSITIVEThis is a modified FDA-approved test that has been validated and its performance characteristics determined by the reporting laboratory.  This laboratory is certified under the Clinical Laboratory Improvement Amendments CLIA as qualified to perform high complexity clinical laboratory testing.    * >=100,000 COLONIES/mL KLEBSIELLA OXYTOCA   Proteus mirabilis - MIC*    AMPICILLIN Value in next row Resistant      16 SENSITIVEThis is a modified FDA-approved test that has been validated and its performance characteristics determined by the reporting laboratory.  This laboratory is certified under the Clinical Laboratory Improvement Amendments CLIA as qualified to perform high complexity clinical laboratory testing.    CEFAZOLIN  (URINE) Value in next row Sensitive      4 SENSITIVEThis is a modified FDA-approved test that has been validated and its performance characteristics determined by the reporting laboratory.  This laboratory is certified under the Clinical Laboratory Improvement Amendments CLIA as qualified to perform high complexity clinical laboratory testing.    CEFEPIME  Value in next row Sensitive      4 SENSITIVEThis is a modified FDA-approved test that has been validated and its performance characteristics determined by the reporting laboratory.  This laboratory is certified under the Clinical Laboratory Improvement Amendments CLIA as qualified to perform high complexity clinical laboratory testing.    ERTAPENEM Value in next row Sensitive      4 SENSITIVEThis is a modified FDA-approved test that has been validated  and its performance characteristics determined by the  reporting laboratory.  This laboratory is certified under the Clinical Laboratory Improvement Amendments CLIA as qualified to perform high complexity clinical laboratory testing.    CEFTRIAXONE  Value in next row Sensitive      4 SENSITIVEThis is a modified FDA-approved test that has been validated and its performance characteristics determined by the reporting laboratory.  This laboratory is certified under the Clinical Laboratory Improvement Amendments CLIA as qualified to perform high complexity clinical laboratory testing.    CIPROFLOXACIN  Value in next row Resistant      4 SENSITIVEThis is a modified FDA-approved test that has been validated and its performance characteristics determined by the reporting laboratory.  This laboratory is certified under the Clinical Laboratory Improvement Amendments CLIA as qualified to perform high complexity clinical laboratory testing.    GENTAMICIN Value in next row Sensitive      4 SENSITIVEThis is a modified FDA-approved test that has been validated and its performance characteristics determined by the reporting laboratory.  This laboratory is certified under the Clinical Laboratory Improvement Amendments CLIA as qualified to perform high complexity clinical laboratory testing.    NITROFURANTOIN Value in next row Resistant      4 SENSITIVEThis is a modified FDA-approved test that has been validated and its performance characteristics determined by the reporting laboratory.  This laboratory is certified under the Clinical Laboratory Improvement Amendments CLIA as qualified to perform high complexity clinical laboratory testing.    TRIMETH/SULFA Value in next row Resistant      4 SENSITIVEThis is a modified FDA-approved test that has been validated and its performance characteristics determined by the reporting laboratory.  This laboratory is certified under the Clinical Laboratory Improvement Amendments CLIA as qualified to perform high complexity clinical  laboratory testing.    AMPICILLIN/SULBACTAM Value in next row Sensitive      4 SENSITIVEThis is a modified FDA-approved test that has been validated and its performance characteristics determined by the reporting laboratory.  This laboratory is certified under the Clinical Laboratory Improvement Amendments CLIA as qualified to perform high complexity clinical laboratory testing.    PIP/TAZO Value in next row Sensitive      <=4 SENSITIVEThis is a modified FDA-approved test that has been validated and its performance characteristics determined by the reporting laboratory.  This laboratory is certified under the Clinical Laboratory Improvement Amendments CLIA as qualified to perform high complexity clinical laboratory testing.    MEROPENEM  Value in next row Sensitive      <=4 SENSITIVEThis is a modified FDA-approved test that has been validated and its performance characteristics determined by the reporting laboratory.  This laboratory is certified under the Clinical Laboratory Improvement Amendments CLIA as qualified to perform high complexity clinical laboratory testing.    * 40,000 COLONIES/mL PROTEUS MIRABILIS         Radiology Studies: ECHOCARDIOGRAM COMPLETE Result Date: 06/30/2024    ECHOCARDIOGRAM REPORT   Patient Name:   [name] Date of Exam: 06/30/2024 Medical Rec #:  [id number]      Height: Accession #:    [id number]     Weight: Date of Birth:  May 24, 1951     BSA: Patient Age:    72 years       BP:           122/67 mmHg Patient Gender: M              HR:           45 bpm.  Exam Location:  [facility] Procedure: 2D Echo, Cardiac Doppler, Color Doppler, 3D Echo and Strain Analysis            (Both Spectral and Color Flow Doppler were utilized during            procedure). Indications:     Bacteremia R78.81  History:         Patient has prior history of Echocardiogram examinations, most                  recent 12/24/2022. CAD. Chest pain.  Sonographer:     Christopher Furnace Referring Phys:  [id number] LANETA BLUNT Diagnosing Phys: Lonni Hanson MD  Sonographer Comments: Global longitudinal strain was attempted. IMPRESSIONS  1. Left ventricular ejection fraction, by estimation, is 55 to 60%. The left ventricle has normal function. The left ventricle has no regional wall motion abnormalities. There is mild left ventricular hypertrophy. Left ventricular diastolic parameters were normal. The average left ventricular global longitudinal strain is -18.1 %. The global longitudinal strain is normal.  2. Right ventricular systolic function is normal. The right ventricular size is normal. Tricuspid regurgitation signal is inadequate for assessing PA pressure.  3. Left atrial size was mildly dilated.  4. The mitral valve is normal in structure. Trivial mitral valve regurgitation. No evidence of mitral stenosis.  5. The aortic valve is tricuspid. Aortic valve regurgitation is not visualized. No aortic stenosis is present. FINDINGS  Left Ventricle: Left ventricular ejection fraction, by estimation, is 55 to 60%. The left ventricle has normal function. The left ventricle has no regional wall motion abnormalities. The average left ventricular global longitudinal strain is -18.1 %. Strain was performed and the global longitudinal strain is normal. The left ventricular internal cavity size was normal in size. There is mild left ventricular hypertrophy. Left ventricular diastolic parameters were normal. Right Ventricle: The right ventricular size is normal. No increase in right ventricular wall thickness. Right ventricular systolic function is normal. Tricuspid regurgitation signal is inadequate for assessing PA pressure. Left Atrium: Left atrial size was mildly dilated. Right Atrium: Right atrial size was normal in size. Pericardium: The pericardium was not well visualized. Mitral Valve: The mitral valve is normal in structure. Trivial mitral valve regurgitation. No evidence of mitral valve stenosis. MV peak gradient, 3.9 mmHg.  The mean mitral valve gradient is 1.0 mmHg. Tricuspid Valve: The tricuspid valve is grossly normal. Tricuspid valve regurgitation is trivial. Aortic Valve: The aortic valve is tricuspid. Aortic valve regurgitation is not visualized. No aortic stenosis is present. Aortic valve mean gradient measures 4.0 mmHg. Aortic valve peak gradient measures 8.5 mmHg. Aortic valve area, by VTI measures 3.10 cm. Pulmonic Valve: The pulmonic valve was not well visualized. Pulmonic valve regurgitation is not visualized. No evidence of pulmonic stenosis. Aorta: The aortic root is normal in size and structure. Pulmonary Artery: The pulmonary artery is of normal size. IAS/Shunts: The interatrial septum was not well visualized.  LEFT VENTRICLE PLAX 2D LVIDd:         5.30 cm   Diastology LVIDs:         3.60 cm   LV e' medial:    14.00 cm/s LV PW:         1.30 cm   LV E/e' medial:  6.8 LV IVS:        1.30 cm   LV e' lateral:   14.90 cm/s LVOT diam:     2.20 cm   LV E/e' lateral: 6.4 LV SV:  86 LVOT Area:     3.80 cm  2D Longitudinal Strain LV IVRT:       98 msec   2D Strain GLS Avg:     -18.1 %  RIGHT VENTRICLE RV Basal diam:  3.60 cm RV Mid diam:    3.40 cm RV S prime:     17.40 cm/s LEFT ATRIUM           RIGHT ATRIUM LA diam:      4.20 cm RA Area:     17.60 cm LA Vol (A2C): 54.7 ml RA Volume:   39.30 ml LA Vol (A4C): 54.6 ml  AORTIC VALVE AV Area (Vmax):    2.99 cm AV Area (Vmean):   2.90 cm AV Area (VTI):     3.10 cm AV Vmax:           146.00 cm/s AV Vmean:          93.600 cm/s AV VTI:            0.276 m AV Peak Grad:      8.5 mmHg AV Mean Grad:      4.0 mmHg LVOT Vmax:         115.00 cm/s LVOT Vmean:        71.300 cm/s LVOT VTI:          0.225 m LVOT/AV VTI ratio: 0.82  AORTA Ao Root diam: 3.00 cm MITRAL VALVE MV Area (PHT): 2.17 cm    SHUNTS MV Area VTI:   1.95 cm    Systemic VTI:  0.22 m MV Peak grad:  3.9 mmHg    Systemic Diam: 2.20 cm MV Mean grad:  1.0 mmHg MV Vmax:       0.98 m/s MV Vmean:      55.7 cm/s MV Decel  Time: 350 msec MV E velocity: 95.40 cm/s MV A velocity: 60.30 cm/s MV E/A ratio:  1.58 Lonni End MD Electronically signed by Lonni Hanson MD Signature Date/Time: 06/30/2024/3:43:07 PM    Final      Scheduled Meds: . aspirin   81 mg Oral Daily  . atorvastatin   40 mg Oral Daily  . Chlorhexidine  Gluconate Cloth  6 each Topical Daily  . citalopram   20 mg Oral Daily  . feeding supplement  237 mL Oral TID BM  . gabapentin   300 mg Oral BID  . guaiFENesin   600 mg Oral BID  . lactulose   45 g Oral TID  . methocarbamol   500 mg Oral QID  . multivitamin with minerals  1 tablet Oral Daily  . nystatin  1 Application Topical TID  . pantoprazole   40 mg Oral Daily  . QUEtiapine   50 mg Oral QHS  . rifaximin   550 mg Oral BID  . spironolactone   12.5 mg Oral Daily   Continuous Infusions: . cefTRIAXone  (ROCEPHIN )  IV Stopped (07/01/24 1945)     LOS: 5 days    Hartley Urton Al-Sultani, MD [facility]   If 7PM-7AM, please contact night-coverage  07/01/2024, 11:43 PM

## 2024-07-01 NOTE — Plan of Care (Signed)

## 2024-07-01 NOTE — Progress Notes (Signed)
 PROGRESS NOTE    [name]  FMW:[id number] DOB: [birth year] DOA: 06/26/2024 PCP: [facility]    Brief Narrative:  [name] is a 73 y.o. male with medical history significant for diastolic CHF with EF of 60 to 34%, anemia, asthma, history of E. coli and Klebsiella pneumoniae bacteremia, cirrhosis, coronary artery disease, GERD, CKD3a, hypertension dyslipidemia, who presented to the ER with acute onset of fever that was up to 103.5 and chills with associated lethargy at his group home.    10/31: admitted for sepsis / UTI, AKI, hyponatremia. Started on fluids, cefepime .  11/01: (+)bacteremia, remain on IV abx for now, Foley exchanged  11/02: pending susceptibilities, remain on IV abx plan repeat BCx tomorrow / follow w/ ID  11/03: ID to see 11/04: will need 7 days IV abx total  can't go to group home for this an on sex offender registry so can't go to SNF will be here 11/05: no acute events, continue IV rocephin      Assessment and Plan:  # Sepsis secondary to bacteremia and UTI # Klebsiella oxytoca bacteremia in the setting of complicated UTI with indwelling foley catheter # Chronic indwelling foley catheter - Presented with fever to 100.8 and tachypnea to 22 - UCx positive for Klebsiella oxytoca, Proteus mirabilis, Enterococcus faecalis - BCx (10/31) positive for Klebsiella oxytoca  - ID following - recommending 7 days of IV antibiotics given resistance pattern of Klebsiella oxytoca - On IV Rocephin  2 g every day for 7 days total with end date of 07/04/2024  # Hyponatremia - Na 130 on presentation, improved to 137 initially - Na now 133  # Acute on chronic anemia # IDA - Continue iron supplementation  # Cirrhosis # Acute hepatic encephalopathy - improved - Continue lactulose  and rifaximin  - Continue spironolactone  12.5 mg daily - Delirium precautions  # Depression - Continue Celexa  and Seroquel   # GERD - PPI  # Peripheral neuropathy -  Continue Gabapentin   # Dyslipidemia - Continue statin    DVT prophylaxis: Place and maintain sequential compression device Start: 06/27/24 0904  Code Status:   Code Status: Limited: Do not attempt resuscitation (DNR) -DNR-LIMITED -Do Not Intubate/DNI  Family Communication: None Disposition Plan: Group home after completion of IV antibiotics Level of care: Med-Surg  Consultants:  ID  Procedures:  None  Antimicrobials: IV Rocephin     Subjective: No acute events overnight. Overall doing well, no concerns. Subjectively feels better, has been getting up to move around. Denies N/V.  Objective: Vitals:   06/30/24 2017 07/01/24 0506 07/01/24 0944 07/01/24 2020  BP: 117/62 132/62 (!) 152/68 (!) 119/59  Pulse: (!) 47 (!) 47 (!) 48 (!) 45  Resp: 20 20 18    Temp: 98.8 F (37.1 C)  97.8 F (36.6 C) 98.8 F (37.1 C)  TempSrc: Oral   Oral  SpO2: 99% 97% 100% 99%  Weight:      Height:        Intake/Output Summary (Last 24 hours) at 07/01/2024 2343 Last data filed at 07/01/2024 1300 Gross per 24 hour  Intake 0 ml  Output 900 ml  Net -900 ml   Filed Weights   06/26/24 2101  Weight: 94 kg    Examination:  General exam: Appears calm and comfortable  Respiratory system: Clear to auscultation. Respiratory effort normal. Cardiovascular system: S1 & S2 heard, RRR. No JVD, murmurs. No pedal edema. Gastrointestinal system: Abdomen is nondistended, soft and nontender. No organomegaly or masses felt. Normal bowel sounds heard.  GU: foley catheter in place Central nervous system: Alert and oriented. No focal neurological deficits. Extremities: Symmetric 5 x 5 power. Skin: No rashes, lesions or ulcers Psychiatry: Judgement and insight appear normal. Mood & affect appropriate.     Data Reviewed: I have personally reviewed following labs and imaging studies  CBC: Recent Labs  Lab 06/26/24 2100 06/27/24 0523 06/28/24 0604 06/29/24 0515 06/30/24 0510  WBC 11.5* 9.0 5.8 4.2  3.5*  NEUTROABS 9.8*  --   --   --   --   HGB 9.6* 10.6* 9.4* 9.2* 9.4*  HCT 29.3* 31.9* 28.6* 27.7* 29.5*  MCV 89.1 87.4 88.8 86.8 91.0  PLT 61* 65* 59* 63* 66*   Basic Metabolic Panel: Recent Labs  Lab 06/26/24 2100 06/26/24 2130 06/27/24 0523 06/28/24 0604 06/29/24 0515 06/30/24 0510  NA 130*  --  134* 137 136 133*  K 3.5  --  4.0 4.3 4.3 4.1  CL 100  --  104 104 108 104  CO2 22  --  24 22 23 25   GLUCOSE 118*  --  75 113* 76 88  BUN 41*  --  38* 35* 31* 31*  CREATININE 2.16*  --  2.17* 1.91* 1.72* 1.71*  CALCIUM  8.8*  --  9.1 9.7 9.5 9.7  MG  --  1.7  --   --   --   --    GFR: Estimated Creatinine Clearance: 40.7 mL/min (A) (by C-G formula based on SCr of 1.71 mg/dL (H)). Liver Function Tests: Recent Labs  Lab 06/26/24 2100  AST 53*  ALT 28  ALKPHOS 72  BILITOT 2.1*  PROT 6.4*  ALBUMIN  2.1*   No results for input(s): LIPASE, AMYLASE in the last 168 hours. Recent Labs  Lab 06/26/24 2100 06/28/24 0604  AMMONIA 78* 70*   Coagulation Profile: Recent Labs  Lab 06/26/24 2100 06/27/24 0523  INR 1.5* 1.4*   Cardiac Enzymes: No results for input(s): CKTOTAL, CKMB, CKMBINDEX, TROPONINI in the last 168 hours. BNP (last 3 results) No results for input(s): PROBNP in the last 8760 hours. HbA1C: No results for input(s): HGBA1C in the last 72 hours. CBG: No results for input(s): GLUCAP in the last 168 hours. Lipid Profile: No results for input(s): CHOL, HDL, LDLCALC, TRIG, CHOLHDL, LDLDIRECT in the last 72 hours. Thyroid  Function Tests: No results for input(s): TSH, T4TOTAL, FREET4, T3FREE, THYROIDAB in the last 72 hours. Anemia Panel: No results for input(s): VITAMINB12, FOLATE, FERRITIN, TIBC, IRON, RETICCTPCT in the last 72 hours. Sepsis Labs: Recent Labs  Lab 06/26/24 2100 06/26/24 2316  LATICACIDVEN 1.8 1.6    Recent Results (from the past 240 hours)  Blood culture (routine x 2)     Status:  Abnormal   Collection Time: 06/26/24  9:27 PM   Specimen: BLOOD  Result Value Ref Range Status   Specimen Description   Final    BLOOD BLOOD LEFT ARM Performed at [facility], [address], [place], [place] [address]    Special Requests   Final    BOTTLES DRAWN AEROBIC AND ANAEROBIC Blood Culture results may not be optimal due to an excessive volume of blood received in culture bottles Performed at [facility], [address], [place], [place] [address]    Culture  Setup Time   Final    GRAM NEGATIVE RODS IN BOTH AEROBIC AND ANAEROBIC BOTTLES CRITICAL VALUE NOTED.  VALUE IS CONSISTENT WITH PREVIOUSLY REPORTED AND CALLED VALUE. Performed at [facility], [address]  [place], [place] [address]    Culture (A)  Final    KLEBSIELLA OXYTOCA SUSCEPTIBILITIES PERFORMED ON PREVIOUS CULTURE WITHIN THE LAST 5 DAYS. Performed at [facility], [address] [place], [place] [address]    Report Status 06/29/2024 FINAL  Final  Blood culture (routine x 2)     Status: Abnormal   Collection Time: 06/26/24  9:27 PM   Specimen: BLOOD  Result Value Ref Range Status   Specimen Description   Final    BLOOD BLOOD RIGHT ARM Performed at [facility], [address], [place], [place] [address]    Special Requests   Final    BOTTLES DRAWN AEROBIC AND ANAEROBIC Blood Culture adequate volume Performed at [facility], [address], [place], [place] [address]    Culture  Setup Time   Final    GRAM NEGATIVE RODS IN BOTH AEROBIC AND ANAEROBIC BOTTLES CRITICAL RESULT CALLED TO, READ BACK BY AND VERIFIED WITH: ALEX CHAPPELL ON 06/27/24 AT 1129 RAM/QSD Performed at [facility], [address], [place], [place] [address]    Culture KLEBSIELLA OXYTOCA (A)  Final   Report Status 06/29/2024 FINAL  Final   Organism ID, Bacteria KLEBSIELLA OXYTOCA  Final      Susceptibility   Klebsiella oxytoca - MIC*    AMPICILLIN >=32 RESISTANT  Resistant     CEFEPIME  <=0.12 SENSITIVE Sensitive     ERTAPENEM <=0.12 SENSITIVE Sensitive     CEFTRIAXONE  <=0.25 SENSITIVE Sensitive     CIPROFLOXACIN  1 RESISTANT Resistant     GENTAMICIN <=1 SENSITIVE Sensitive     MEROPENEM  <=0.25 SENSITIVE Sensitive     TRIMETH/SULFA >=320 RESISTANT Resistant     AMPICILLIN/SULBACTAM >=32 RESISTANT Resistant     PIP/TAZO Value in next row Sensitive      16 SENSITIVEThis is a modified FDA-approved test that has been validated and its performance characteristics determined by the reporting laboratory.  This laboratory is certified under the Clinical Laboratory Improvement Amendments CLIA as qualified to perform high complexity clinical laboratory testing.    * KLEBSIELLA OXYTOCA  Blood Culture ID Panel (Reflexed)     Status: Abnormal   Collection Time: 06/26/24  9:27 PM  Result Value Ref Range Status   Enterococcus faecalis NOT DETECTED NOT DETECTED Final   Enterococcus Faecium NOT DETECTED NOT DETECTED Final   Listeria monocytogenes NOT DETECTED NOT DETECTED Final   Staphylococcus species NOT DETECTED NOT DETECTED Final   Staphylococcus aureus (BCID) NOT DETECTED NOT DETECTED Final   Staphylococcus epidermidis NOT DETECTED NOT DETECTED Final   Staphylococcus lugdunensis NOT DETECTED NOT DETECTED Final   Streptococcus species NOT DETECTED NOT DETECTED Final   Streptococcus agalactiae NOT DETECTED NOT DETECTED Final   Streptococcus pneumoniae NOT DETECTED NOT DETECTED Final   Streptococcus pyogenes NOT DETECTED NOT DETECTED Final   A.calcoaceticus-baumannii NOT DETECTED NOT DETECTED Final   Bacteroides fragilis NOT DETECTED NOT DETECTED Final   Enterobacterales DETECTED (A) NOT DETECTED Final    Comment: Enterobacterales represent a large order of gram negative bacteria, not a single organism. CRITICAL RESULT CALLED TO, READ BACK BY AND VERIFIED WITH: ALEX CHAPPELL ON 06/27/24 AT 1129 RAM/QSD    Enterobacter cloacae complex NOT DETECTED NOT DETECTED  Final   Escherichia coli NOT DETECTED NOT DETECTED Final   Klebsiella aerogenes NOT DETECTED NOT DETECTED Final   Klebsiella oxytoca DETECTED (A) NOT DETECTED Final    Comment: CRITICAL RESULT CALLED TO, READ BACK BY AND VERIFIED WITH: ALEX CHAPPELL ON  06/27/24 AT 1129 RAM/QSD    Klebsiella pneumoniae NOT DETECTED NOT DETECTED Final   Proteus species NOT DETECTED NOT DETECTED Final   Salmonella species NOT DETECTED NOT DETECTED Final   Serratia marcescens NOT DETECTED NOT DETECTED Final   Haemophilus influenzae NOT DETECTED NOT DETECTED Final   Neisseria meningitidis NOT DETECTED NOT DETECTED Final   Pseudomonas aeruginosa NOT DETECTED NOT DETECTED Final   Stenotrophomonas maltophilia NOT DETECTED NOT DETECTED Final   Candida albicans NOT DETECTED NOT DETECTED Final   Candida auris NOT DETECTED NOT DETECTED Final   Candida glabrata NOT DETECTED NOT DETECTED Final   Candida krusei NOT DETECTED NOT DETECTED Final   Candida parapsilosis NOT DETECTED NOT DETECTED Final   Candida tropicalis NOT DETECTED NOT DETECTED Final   Cryptococcus neoformans/gattii NOT DETECTED NOT DETECTED Final   CTX-M ESBL NOT DETECTED NOT DETECTED Final   Carbapenem resistance IMP NOT DETECTED NOT DETECTED Final   Carbapenem resistance KPC NOT DETECTED NOT DETECTED Final   Carbapenem resistance NDM NOT DETECTED NOT DETECTED Final   Carbapenem resist OXA 48 LIKE NOT DETECTED NOT DETECTED Final   Carbapenem resistance VIM NOT DETECTED NOT DETECTED Final    Comment: Performed at [facility], [address] [place], [place] [address]  Resp panel by RT-PCR (RSV, Flu A&B, Covid) Anterior Nasal Swab     Status: None   Collection Time: 06/26/24 11:16 PM   Specimen: Anterior Nasal Swab  Result Value Ref Range Status   SARS Coronavirus 2 by RT PCR NEGATIVE NEGATIVE Final    Comment: (NOTE) SARS-CoV-2 target nucleic acids are NOT DETECTED.  The SARS-CoV-2 RNA is generally detectable in upper  respiratory specimens during the acute phase of infection. The lowest concentration of SARS-CoV-2 viral copies this assay can detect is 138 copies/mL. A negative result does not preclude SARS-Cov-2 infection and should not be used as the sole basis for treatment or other patient management decisions. A negative result may occur with  improper specimen collection/handling, submission of specimen other than nasopharyngeal swab, presence of viral mutation(s) within the areas targeted by this assay, and inadequate number of viral copies(<138 copies/mL). A negative result must be combined with clinical observations, patient history, and epidemiological information. The expected result is Negative.  Fact Sheet for Patients:  bloggercourse.com  Fact Sheet for Healthcare Providers:  seriousbroker.it  This test is no t yet approved or cleared by the [place]  FDA and  has been authorized for detection and/or diagnosis of SARS-CoV-2 by FDA under an Emergency Use Authorization (EUA). This EUA will remain  in effect (meaning this test can be used) for the duration of the COVID-19 declaration under Section 564(b)(1) of the Act, 21 U.S.C.section 360bbb-3(b)(1), unless the authorization is terminated  or revoked sooner.       Influenza A by PCR NEGATIVE NEGATIVE Final   Influenza B by PCR NEGATIVE NEGATIVE Final    Comment: (NOTE) The Xpert Xpress SARS-CoV-2/FLU/RSV plus assay is intended as an aid in the diagnosis of influenza from Nasopharyngeal swab specimens and should not be used as a sole basis for treatment. Nasal washings and aspirates are unacceptable for Xpert Xpress SARS-CoV-2/FLU/RSV testing.  Fact Sheet for Patients: bloggercourse.com  Fact Sheet for Healthcare Providers: seriousbroker.it  This test is not yet approved or cleared by the [place]  FDA and has been  authorized for detection and/or diagnosis of SARS-CoV-2 by FDA under an Emergency Use Authorization (EUA). This EUA will remain in effect (meaning this test can be  used) for the duration of the COVID-19 declaration under Section 564(b)(1) of the Act, 21 U.S.C. section 360bbb-3(b)(1), unless the authorization is terminated or revoked.     Resp Syncytial Virus by PCR NEGATIVE NEGATIVE Final    Comment: (NOTE) Fact Sheet for Patients: bloggercourse.com  Fact Sheet for Healthcare Providers: seriousbroker.it  This test is not yet approved or cleared by the [place]  FDA and has been authorized for detection and/or diagnosis of SARS-CoV-2 by FDA under an Emergency Use Authorization (EUA). This EUA will remain in effect (meaning this test can be used) for the duration of the COVID-19 declaration under Section 564(b)(1) of the Act, 21 U.S.C. section 360bbb-3(b)(1), unless the authorization is terminated or revoked.  Performed at [facility], [address] [place], [place] [address]   Urine Culture     Status: Abnormal   Collection Time: 06/27/24 12:08 AM   Specimen: Urine, Catheterized  Result Value Ref Range Status   Specimen Description   Final    URINE, CATHETERIZED Performed at [facility], [address] [place], [place] [address]    Special Requests   Final    NONE Performed at [facility], [address] [place], [place] [address]    Culture (A)  Final    >=100,000 COLONIES/mL KLEBSIELLA OXYTOCA 40,000 COLONIES/mL PROTEUS MIRABILIS >=100,000 COLONIES/mL ENTEROCOCCUS FAECALIS    Report Status 06/30/2024 FINAL  Final   Organism ID, Bacteria ENTEROCOCCUS FAECALIS (A)  Final   Organism ID, Bacteria KLEBSIELLA OXYTOCA (A)  Final   Organism ID, Bacteria PROTEUS MIRABILIS (A)  Final      Susceptibility   Enterococcus faecalis - MIC*    AMPICILLIN <=2 SENSITIVE Sensitive      NITROFURANTOIN <=16 SENSITIVE Sensitive     VANCOMYCIN  <=0.5 SENSITIVE Sensitive     * >=100,000 COLONIES/mL ENTEROCOCCUS FAECALIS   Klebsiella oxytoca - MIC*    AMPICILLIN >=32 RESISTANT Resistant     CEFEPIME  <=0.12 SENSITIVE Sensitive     ERTAPENEM <=0.12 SENSITIVE Sensitive     CEFTRIAXONE  <=0.25 SENSITIVE Sensitive     CIPROFLOXACIN  1 RESISTANT Resistant     GENTAMICIN <=1 SENSITIVE Sensitive     NITROFURANTOIN <=16 SENSITIVE Sensitive     TRIMETH/SULFA >=320 RESISTANT Resistant     AMPICILLIN/SULBACTAM >=32 RESISTANT Resistant     PIP/TAZO Value in next row Sensitive      16 SENSITIVEThis is a modified FDA-approved test that has been validated and its performance characteristics determined by the reporting laboratory.  This laboratory is certified under the Clinical Laboratory Improvement Amendments CLIA as qualified to perform high complexity clinical laboratory testing.    MEROPENEM  Value in next row Sensitive      16 SENSITIVEThis is a modified FDA-approved test that has been validated and its performance characteristics determined by the reporting laboratory.  This laboratory is certified under the Clinical Laboratory Improvement Amendments CLIA as qualified to perform high complexity clinical laboratory testing.    * >=100,000 COLONIES/mL KLEBSIELLA OXYTOCA   Proteus mirabilis - MIC*    AMPICILLIN Value in next row Resistant      16 SENSITIVEThis is a modified FDA-approved test that has been validated and its performance characteristics determined by the reporting laboratory.  This laboratory is certified under the Clinical Laboratory Improvement Amendments CLIA as qualified to perform high complexity clinical laboratory testing.    CEFAZOLIN  (URINE) Value in next row Sensitive      4 SENSITIVEThis is a modified FDA-approved test that has been validated  and its performance characteristics determined by the reporting laboratory.  This laboratory is certified under the Clinical  Laboratory Improvement Amendments CLIA as qualified to perform high complexity clinical laboratory testing.    CEFEPIME  Value in next row Sensitive      4 SENSITIVEThis is a modified FDA-approved test that has been validated and its performance characteristics determined by the reporting laboratory.  This laboratory is certified under the Clinical Laboratory Improvement Amendments CLIA as qualified to perform high complexity clinical laboratory testing.    ERTAPENEM Value in next row Sensitive      4 SENSITIVEThis is a modified FDA-approved test that has been validated and its performance characteristics determined by the reporting laboratory.  This laboratory is certified under the Clinical Laboratory Improvement Amendments CLIA as qualified to perform high complexity clinical laboratory testing.    CEFTRIAXONE  Value in next row Sensitive      4 SENSITIVEThis is a modified FDA-approved test that has been validated and its performance characteristics determined by the reporting laboratory.  This laboratory is certified under the Clinical Laboratory Improvement Amendments CLIA as qualified to perform high complexity clinical laboratory testing.    CIPROFLOXACIN  Value in next row Resistant      4 SENSITIVEThis is a modified FDA-approved test that has been validated and its performance characteristics determined by the reporting laboratory.  This laboratory is certified under the Clinical Laboratory Improvement Amendments CLIA as qualified to perform high complexity clinical laboratory testing.    GENTAMICIN Value in next row Sensitive      4 SENSITIVEThis is a modified FDA-approved test that has been validated and its performance characteristics determined by the reporting laboratory.  This laboratory is certified under the Clinical Laboratory Improvement Amendments CLIA as qualified to perform high complexity clinical laboratory testing.    NITROFURANTOIN Value in next row Resistant      4 SENSITIVEThis  is a modified FDA-approved test that has been validated and its performance characteristics determined by the reporting laboratory.  This laboratory is certified under the Clinical Laboratory Improvement Amendments CLIA as qualified to perform high complexity clinical laboratory testing.    TRIMETH/SULFA Value in next row Resistant      4 SENSITIVEThis is a modified FDA-approved test that has been validated and its performance characteristics determined by the reporting laboratory.  This laboratory is certified under the Clinical Laboratory Improvement Amendments CLIA as qualified to perform high complexity clinical laboratory testing.    AMPICILLIN/SULBACTAM Value in next row Sensitive      4 SENSITIVEThis is a modified FDA-approved test that has been validated and its performance characteristics determined by the reporting laboratory.  This laboratory is certified under the Clinical Laboratory Improvement Amendments CLIA as qualified to perform high complexity clinical laboratory testing.    PIP/TAZO Value in next row Sensitive      <=4 SENSITIVEThis is a modified FDA-approved test that has been validated and its performance characteristics determined by the reporting laboratory.  This laboratory is certified under the Clinical Laboratory Improvement Amendments CLIA as qualified to perform high complexity clinical laboratory testing.    MEROPENEM  Value in next row Sensitive      <=4 SENSITIVEThis is a modified FDA-approved test that has been validated and its performance characteristics determined by the reporting laboratory.  This laboratory is certified under the Clinical Laboratory Improvement Amendments CLIA as qualified to perform high complexity clinical laboratory testing.    * 40,000 COLONIES/mL PROTEUS MIRABILIS         Radiology Studies:  ECHOCARDIOGRAM COMPLETE Result Date: 06/30/2024    ECHOCARDIOGRAM REPORT   Patient Name:   [name] Date of Exam: 06/30/2024 Medical Rec #:   [id number]      Height: Accession #:    [id number]     Weight: Date of Birth:  [birth year]     BSA: Patient Age:    72 years       BP:           122/67 mmHg Patient Gender: M              HR:           45 bpm. Exam Location:  [facility] Procedure: 2D Echo, Cardiac Doppler, Color Doppler, 3D Echo and Strain Analysis            (Both Spectral and Color Flow Doppler were utilized during            procedure). Indications:     Bacteremia R78.81  History:         Patient has prior history of Echocardiogram examinations, most                  recent 12/24/2022. CAD. Chest pain.  Sonographer:     Christopher Furnace Referring Phys:  [id number] LANETA BLUNT Diagnosing Phys: Lonni Hanson MD  Sonographer Comments: Global longitudinal strain was attempted. IMPRESSIONS  1. Left ventricular ejection fraction, by estimation, is 55 to 60%. The left ventricle has normal function. The left ventricle has no regional wall motion abnormalities. There is mild left ventricular hypertrophy. Left ventricular diastolic parameters were normal. The average left ventricular global longitudinal strain is -18.1 %. The global longitudinal strain is normal.  2. Right ventricular systolic function is normal. The right ventricular size is normal. Tricuspid regurgitation signal is inadequate for assessing PA pressure.  3. Left atrial size was mildly dilated.  4. The mitral valve is normal in structure. Trivial mitral valve regurgitation. No evidence of mitral stenosis.  5. The aortic valve is tricuspid. Aortic valve regurgitation is not visualized. No aortic stenosis is present. FINDINGS  Left Ventricle: Left ventricular ejection fraction, by estimation, is 55 to 60%. The left ventricle has normal function. The left ventricle has no regional wall motion abnormalities. The average left ventricular global longitudinal strain is -18.1 %. Strain was performed and the global longitudinal strain is normal. The left ventricular internal cavity size was normal in size.  There is mild left ventricular hypertrophy. Left ventricular diastolic parameters were normal. Right Ventricle: The right ventricular size is normal. No increase in right ventricular wall thickness. Right ventricular systolic function is normal. Tricuspid regurgitation signal is inadequate for assessing PA pressure. Left Atrium: Left atrial size was mildly dilated. Right Atrium: Right atrial size was normal in size. Pericardium: The pericardium was not well visualized. Mitral Valve: The mitral valve is normal in structure. Trivial mitral valve regurgitation. No evidence of mitral valve stenosis. MV peak gradient, 3.9 mmHg. The mean mitral valve gradient is 1.0 mmHg. Tricuspid Valve: The tricuspid valve is grossly normal. Tricuspid valve regurgitation is trivial. Aortic Valve: The aortic valve is tricuspid. Aortic valve regurgitation is not visualized. No aortic stenosis is present. Aortic valve mean gradient measures 4.0 mmHg. Aortic valve peak gradient measures 8.5 mmHg. Aortic valve area, by VTI measures 3.10 cm. Pulmonic Valve: The pulmonic valve was not well visualized. Pulmonic valve regurgitation is not visualized. No evidence of pulmonic stenosis. Aorta: The aortic root is normal in size and structure. Pulmonary Artery: The  pulmonary artery is of normal size. IAS/Shunts: The interatrial septum was not well visualized.  LEFT VENTRICLE PLAX 2D LVIDd:         5.30 cm   Diastology LVIDs:         3.60 cm   LV e' medial:    14.00 cm/s LV PW:         1.30 cm   LV E/e' medial:  6.8 LV IVS:        1.30 cm   LV e' lateral:   14.90 cm/s LVOT diam:     2.20 cm   LV E/e' lateral: 6.4 LV SV:         86 LVOT Area:     3.80 cm  2D Longitudinal Strain LV IVRT:       98 msec   2D Strain GLS Avg:     -18.1 %  RIGHT VENTRICLE RV Basal diam:  3.60 cm RV Mid diam:    3.40 cm RV S prime:     17.40 cm/s LEFT ATRIUM           RIGHT ATRIUM LA diam:      4.20 cm RA Area:     17.60 cm LA Vol (A2C): 54.7 ml RA Volume:   39.30 ml LA  Vol (A4C): 54.6 ml  AORTIC VALVE AV Area (Vmax):    2.99 cm AV Area (Vmean):   2.90 cm AV Area (VTI):     3.10 cm AV Vmax:           146.00 cm/s AV Vmean:          93.600 cm/s AV VTI:            0.276 m AV Peak Grad:      8.5 mmHg AV Mean Grad:      4.0 mmHg LVOT Vmax:         115.00 cm/s LVOT Vmean:        71.300 cm/s LVOT VTI:          0.225 m LVOT/AV VTI ratio: 0.82  AORTA Ao Root diam: 3.00 cm MITRAL VALVE MV Area (PHT): 2.17 cm    SHUNTS MV Area VTI:   1.95 cm    Systemic VTI:  0.22 m MV Peak grad:  3.9 mmHg    Systemic Diam: 2.20 cm MV Mean grad:  1.0 mmHg MV Vmax:       0.98 m/s MV Vmean:      55.7 cm/s MV Decel Time: 350 msec MV E velocity: 95.40 cm/s MV A velocity: 60.30 cm/s MV E/A ratio:  1.58 Christopher End MD Electronically signed by Lonni Hanson MD Signature Date/Time: 06/30/2024/3:43:07 PM    Final      Scheduled Meds:  aspirin   81 mg Oral Daily   atorvastatin   40 mg Oral Daily   Chlorhexidine  Gluconate Cloth  6 each Topical Daily   citalopram   20 mg Oral Daily   feeding supplement  237 mL Oral TID BM   gabapentin   300 mg Oral BID   guaiFENesin   600 mg Oral BID   lactulose   45 g Oral TID   methocarbamol   500 mg Oral QID   multivitamin with minerals  1 tablet Oral Daily   nystatin  1 Application Topical TID   pantoprazole   40 mg Oral Daily   QUEtiapine   50 mg Oral QHS   rifaximin   550 mg Oral BID   spironolactone   12.5 mg Oral Daily   Continuous Infusions:  cefTRIAXone  (ROCEPHIN )  IV Stopped (07/01/24 1945)     LOS: 5 days    Marites Nath Al-Sultani, MD [facility]   If 7PM-7AM, please contact night-coverage  07/01/2024, 11:43 PM

## 2024-07-01 NOTE — Progress Notes (Signed)
 Mobility Specialist - Progress Note    07/01/24 1300  Mobility  Activity Stood at bedside;Placed arms in swimmer's position - left arm up;Placed arms in swimmer's position - right arm up  Level of Assistance Minimal assist, patient does 75% or more  Assistive Device None  Range of Motion/Exercises All extremities  Activity Response Tolerated fair  Mobility visit 1 Mobility  Mobility Specialist Start Time (ACUTE ONLY) 1343  Mobility Specialist Stop Time (ACUTE ONLY) 1357  Mobility Specialist Time Calculation (min) (ACUTE ONLY) 14 min   Pt was supine in bed with HOB elevated on RA upon entry. Pt initially denied mobility, but eventually agreed to mobility. Pt did state pain in mid section was present. Pt is able today to get to the EOB with modA CGA at a very slow pace. Pt is able today to perform activities at a slow pace. After activity pt remained in bed with needs in reach and alarm on.  Clem Rodes Mobility Specialist 07/01/24, 2:02 PM

## 2024-07-02 ENCOUNTER — Ambulatory Visit
Admission: RE | Admit: 2024-07-02 | Discharge: 2024-07-02 | Disposition: A | Discharge status: Home / Self Care | Source: Ambulatory Visit | Attending: Urology | Admitting: Urology

## 2024-07-02 DIAGNOSIS — N390 Urinary tract infection, site not specified: Secondary | ICD-10-CM | POA: Diagnosis not present

## 2024-07-02 DIAGNOSIS — A4150 Gram-negative sepsis, unspecified: Secondary | ICD-10-CM | POA: Diagnosis not present

## 2024-07-02 LAB — BASIC METABOLIC PANEL WITH GFR
Anion gap: 5 (ref 5–15)
BUN: 30 mg/dL — ABNORMAL HIGH (ref 8–23)
CO2: 25 mmol/L (ref 22–32)
Calcium: 9.8 mg/dL (ref 8.9–10.3)
Chloride: 107 mmol/L (ref 98–111)
Creatinine, Ser: 1.51 mg/dL — ABNORMAL HIGH (ref 0.61–1.24)
GFR, Estimated: 48 mL/min — ABNORMAL LOW (ref >60)
Glucose, Bld: 111 mg/dL — ABNORMAL HIGH (ref 70–99)
Potassium: 4.3 mmol/L (ref 3.5–5.1)
Sodium: 137 mmol/L (ref 135–145)

## 2024-07-02 LAB — CBC
HCT: 27.9 % — ABNORMAL LOW (ref 39.0–52.0)
Hemoglobin: 9 g/dL — ABNORMAL LOW (ref 13.0–17.0)
MCH: 28.6 pg (ref 26.0–34.0)
MCHC: 32.3 g/dL (ref 30.0–36.0)
MCV: 88.6 fL (ref 80.0–100.0)
Platelets: 74 K/uL — ABNORMAL LOW (ref 150–400)
RBC: 3.15 MIL/uL — ABNORMAL LOW (ref 4.22–5.81)
RDW: 17.2 % — ABNORMAL HIGH (ref 11.5–15.5)
WBC: 3.8 K/uL — ABNORMAL LOW (ref 4.0–10.5)
nRBC: 0 % (ref 0.0–0.2)

## 2024-07-02 NOTE — Progress Notes (Signed)
 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

## 2024-07-02 NOTE — Progress Notes (Signed)
 Mobility Specialist - Progress Note    07/02/24 1700  Mobility  Activity Ambulated with assistance;Stood at bedside;Dangled on edge of bed  Level of Assistance Contact guard assist, steadying assist  Assistive Device Front wheel walker (O2 tank)  Distance Ambulated (ft) 25 ft  Range of Motion/Exercises All extremities  Activity Response Tolerated well  Mobility visit 1 Mobility  Mobility Specialist Start Time (ACUTE ONLY) 1642  Mobility Specialist Stop Time (ACUTE ONLY) 1705  Mobility Specialist Time Calculation (min) (ACUTE ONLY) 23 min   Pt was supine in bed on RA upon entry with guest in the room. Pt agreed to mobility. Pt is able to continue independent function today. Pt ambulated well and perform mobile activities. After activity pt returned to bed with needs in reach.  Clem Rodes Mobility Specialist 07/02/24, 5:44 PM

## 2024-07-02 NOTE — Plan of Care (Signed)

## 2024-07-02 NOTE — Progress Notes (Signed)
 PROGRESS NOTE    [name]  FMW:[id number] DOB: 25-Jul-1951 DOA: 06/26/2024 PCP: [facility]    Brief Narrative:  [name] is a 73 y.o. male with medical history significant for diastolic CHF with EF of 60 to 34%, anemia, asthma, history of E. coli and Klebsiella pneumoniae bacteremia, cirrhosis, coronary artery disease, GERD, CKD3a, hypertension dyslipidemia, who presented to the ER with acute onset of fever that was up to 103.5 and chills with associated lethargy at his group home.    10/31: admitted for sepsis / UTI, AKI, hyponatremia. Started on fluids, cefepime .  11/01: (+)bacteremia, remain on IV abx for now, Foley exchanged  11/02: pending susceptibilities, remain on IV abx plan repeat BCx tomorrow / follow w/ ID  11/03: ID to see 11/04: will need 7 days IV abx total  can't go to group home for this an on sex offender registry so can't go to SNF will be here 11/05: no acute events, continue IV rocephin  11/06: no acute events, continue IV rocephin    Assessment and Plan:  # Sepsis secondary to bacteremia and UTI # Klebsiella oxytoca bacteremia in the setting of complicated UTI with indwelling foley catheter - Presented with fever to 100.8 (Tmax 103.5 per EMS), tachypnea to 22, and AKI - UCx positive for Klebsiella oxytoca, Proteus mirabilis, Enterococcus faecalis - BCx (10/31) positive for Klebsiella oxytoca  - ID following - recommending 7 days of IV antibiotics given resistance pattern of Klebsiella oxytoca - On IV Rocephin  2 g every day for 7 days total with end date of 07/03/2024  # Chronic indwelling foley catheter - Patient was apparently scheduled for placement of suprapubic catheter with IR on 10/28, which did not happen - Patient will need to follow-up with IR to reschedule placement of suprapubic catheter.  Curbsided urology, indicated no urgency and placement and hospitalization  #AKI on CKD Stage IIIa - Patient presented with Cr of 2.16,  baseline around 1.1-1.3 - Improving  - Cr 1.51 today  # Thrombocytopenia, chronic - Secondary to liver disease - Improved to 74 today  # Hyponatremia - resolved - Na 130 on presentation, improved to 137 initially - Na now 137  # Acute on chronic anemia # IDA - Continue iron supplementation  # Cirrhosis # Acute hepatic encephalopathy - improved - Continue lactulose  and rifaximin  - Continue spironolactone  12.5 mg daily - Delirium precautions  # Depression - Continue Celexa  and Seroquel   # GERD - PPI  # Peripheral neuropathy - Continue Gabapentin   # Dyslipidemia - Continue statin    DVT prophylaxis: Place and maintain sequential compression device Start: 06/27/24 0904  Code Status:   Code Status: Limited: Do not attempt resuscitation (DNR) -DNR-LIMITED -Do Not Intubate/DNI  Family Communication: None Disposition Plan: Group home after completion of IV antibiotics Level of care: Med-Surg  Consultants:  ID  Procedures:  None  Antimicrobials: IV Rocephin     Subjective: No acute events overnight. Overall doing well, no concerns.  Denies any nausea or vomiting.  Objective: Vitals:   07/01/24 0506 07/01/24 0944 07/01/24 2020 07/02/24 0436  BP: 132/62 (!) 152/68 (!) 119/59 (!) 122/58  Pulse: (!) 47 (!) 48 (!) 45 (!) 50  Resp: 20 18    Temp:  97.8 F (36.6 C) 98.8 F (37.1 C) 98.8 F (37.1 C)  TempSrc:   Oral Oral  SpO2: 97% 100% 99% 99%  Weight:      Height:        Intake/Output Summary (Last 24 hours)  at 07/02/2024 0835 Last data filed at 07/02/2024 0500 Gross per 24 hour  Intake 0 ml  Output 1150 ml  Net -1150 ml   Filed Weights   06/26/24 2101  Weight: 94 kg    Examination:  General exam: Appears calm and comfortable  Respiratory system: Clear to auscultation. Respiratory effort normal. Cardiovascular system: S1 & S2 heard, RRR. No JVD, murmurs. No pedal edema. Gastrointestinal system: Abdomen is nondistended, soft and nontender. No  organomegaly or masses felt. Normal bowel sounds heard. GU: foley catheter in place Central nervous system: Alert and oriented. No focal neurological deficits. Extremities: Symmetric 5 x 5 power. Skin: No rashes, lesions or ulcers Psychiatry: Judgement and insight appear normal. Mood & affect appropriate.     Data Reviewed: I have personally reviewed following labs and imaging studies  CBC: Recent Labs  Lab 06/26/24 2100 06/27/24 0523 06/28/24 0604 06/29/24 0515 06/30/24 0510 07/02/24 0454  WBC 11.5* 9.0 5.8 4.2 3.5* 3.8*  NEUTROABS 9.8*  --   --   --   --   --   HGB 9.6* 10.6* 9.4* 9.2* 9.4* 9.0*  HCT 29.3* 31.9* 28.6* 27.7* 29.5* 27.9*  MCV 89.1 87.4 88.8 86.8 91.0 88.6  PLT 61* 65* 59* 63* 66* 74*   Basic Metabolic Panel: Recent Labs  Lab 06/26/24 2130 06/27/24 0523 06/28/24 0604 06/29/24 0515 06/30/24 0510 07/02/24 0454  NA  --  134* 137 136 133* 137  K  --  4.0 4.3 4.3 4.1 4.3  CL  --  104 104 108 104 107  CO2  --  24 22 23 25 25   GLUCOSE  --  75 113* 76 88 111*  BUN  --  38* 35* 31* 31* 30*  CREATININE  --  2.17* 1.91* 1.72* 1.71* 1.51*  CALCIUM   --  9.1 9.7 9.5 9.7 9.8  MG 1.7  --   --   --   --   --    GFR: Estimated Creatinine Clearance: 46.1 mL/min (A) (by C-G formula based on SCr of 1.51 mg/dL (H)). Liver Function Tests: Recent Labs  Lab 06/26/24 2100  AST 53*  ALT 28  ALKPHOS 72  BILITOT 2.1*  PROT 6.4*  ALBUMIN  2.1*   No results for input(s): LIPASE, AMYLASE in the last 168 hours. Recent Labs  Lab 06/26/24 2100 06/28/24 0604  AMMONIA 78* 70*   Coagulation Profile: Recent Labs  Lab 06/26/24 2100 06/27/24 0523  INR 1.5* 1.4*   Cardiac Enzymes: No results for input(s): CKTOTAL, CKMB, CKMBINDEX, TROPONINI in the last 168 hours. BNP (last 3 results) No results for input(s): PROBNP in the last 8760 hours. HbA1C: No results for input(s): HGBA1C in the last 72 hours. CBG: No results for input(s): GLUCAP in the last  168 hours. Lipid Profile: No results for input(s): CHOL, HDL, LDLCALC, TRIG, CHOLHDL, LDLDIRECT in the last 72 hours. Thyroid  Function Tests: No results for input(s): TSH, T4TOTAL, FREET4, T3FREE, THYROIDAB in the last 72 hours. Anemia Panel: No results for input(s): VITAMINB12, FOLATE, FERRITIN, TIBC, IRON, RETICCTPCT in the last 72 hours. Sepsis Labs: Recent Labs  Lab 06/26/24 2100 06/26/24 2316  LATICACIDVEN 1.8 1.6    Recent Results (from the past 240 hours)  Blood culture (routine x 2)     Status: Abnormal   Collection Time: 06/26/24  9:27 PM   Specimen: BLOOD  Result Value Ref Range Status   Specimen Description   Final    BLOOD BLOOD LEFT ARM Performed at [facility],  [address] [place], [place] [address]    Special Requests   Final    BOTTLES DRAWN AEROBIC AND ANAEROBIC Blood Culture results may not be optimal due to an excessive volume of blood received in culture bottles Performed at [facility], [address] [place], [place] [address]    Culture  Setup Time   Final    GRAM NEGATIVE RODS IN BOTH AEROBIC AND ANAEROBIC BOTTLES CRITICAL VALUE NOTED.  VALUE IS CONSISTENT WITH PREVIOUSLY REPORTED AND CALLED VALUE. Performed at [facility], [address] [place], [place] [address]    Culture (A)  Final    KLEBSIELLA OXYTOCA SUSCEPTIBILITIES PERFORMED ON PREVIOUS CULTURE WITHIN THE LAST 5 DAYS. Performed at [facility], [address] [place], [place] [address]    Report Status 06/29/2024 FINAL  Final  Blood culture (routine x 2)     Status: Abnormal   Collection Time: 06/26/24  9:27 PM   Specimen: BLOOD  Result Value Ref Range Status   Specimen Description   Final    BLOOD BLOOD RIGHT ARM Performed at [facility], [address] [place], [place] [address]    Special Requests   Final    BOTTLES DRAWN AEROBIC AND ANAEROBIC Blood Culture adequate volume Performed at  [facility], [address], [place], [place] [address]    Culture  Setup Time   Final    GRAM NEGATIVE RODS IN BOTH AEROBIC AND ANAEROBIC BOTTLES CRITICAL RESULT CALLED TO, READ BACK BY AND VERIFIED WITH: ALEX CHAPPELL ON 06/27/24 AT 1129 RAM/QSD Performed at [facility], [address], [place], [place] [address]    Culture KLEBSIELLA OXYTOCA (A)  Final   Report Status 06/29/2024 FINAL  Final   Organism ID, Bacteria KLEBSIELLA OXYTOCA  Final      Susceptibility   Klebsiella oxytoca - MIC*    AMPICILLIN >=32 RESISTANT Resistant     CEFEPIME  <=0.12 SENSITIVE Sensitive     ERTAPENEM <=0.12 SENSITIVE Sensitive     CEFTRIAXONE  <=0.25 SENSITIVE Sensitive     CIPROFLOXACIN  1 RESISTANT Resistant     GENTAMICIN <=1 SENSITIVE Sensitive     MEROPENEM  <=0.25 SENSITIVE Sensitive     TRIMETH/SULFA >=320 RESISTANT Resistant     AMPICILLIN/SULBACTAM >=32 RESISTANT Resistant     PIP/TAZO Value in next row Sensitive      16 SENSITIVEThis is a modified FDA-approved test that has been validated and its performance characteristics determined by the reporting laboratory.  This laboratory is certified under the Clinical Laboratory Improvement Amendments CLIA as qualified to perform high complexity clinical laboratory testing.    * KLEBSIELLA OXYTOCA  Blood Culture ID Panel (Reflexed)     Status: Abnormal   Collection Time: 06/26/24  9:27 PM  Result Value Ref Range Status   Enterococcus faecalis NOT DETECTED NOT DETECTED Final   Enterococcus Faecium NOT DETECTED NOT DETECTED Final   Listeria monocytogenes NOT DETECTED NOT DETECTED Final   Staphylococcus species NOT DETECTED NOT DETECTED Final   Staphylococcus aureus (BCID) NOT DETECTED NOT DETECTED Final   Staphylococcus epidermidis NOT DETECTED NOT DETECTED Final   Staphylococcus lugdunensis NOT DETECTED NOT DETECTED Final   Streptococcus species NOT DETECTED NOT DETECTED Final   Streptococcus agalactiae NOT DETECTED NOT DETECTED  Final   Streptococcus pneumoniae NOT DETECTED NOT DETECTED Final   Streptococcus pyogenes NOT DETECTED NOT DETECTED Final   A.calcoaceticus-baumannii NOT DETECTED NOT DETECTED Final   Bacteroides fragilis NOT DETECTED NOT DETECTED Final  Enterobacterales DETECTED (A) NOT DETECTED Final    Comment: Enterobacterales represent a large order of gram negative bacteria, not a single organism. CRITICAL RESULT CALLED TO, READ BACK BY AND VERIFIED WITH: ALEX CHAPPELL ON 06/27/24 AT 1129 RAM/QSD    Enterobacter cloacae complex NOT DETECTED NOT DETECTED Final   Escherichia coli NOT DETECTED NOT DETECTED Final   Klebsiella aerogenes NOT DETECTED NOT DETECTED Final   Klebsiella oxytoca DETECTED (A) NOT DETECTED Final    Comment: CRITICAL RESULT CALLED TO, READ BACK BY AND VERIFIED WITH: ALEX CHAPPELL ON 06/27/24 AT 1129 RAM/QSD    Klebsiella pneumoniae NOT DETECTED NOT DETECTED Final   Proteus species NOT DETECTED NOT DETECTED Final   Salmonella species NOT DETECTED NOT DETECTED Final   Serratia marcescens NOT DETECTED NOT DETECTED Final   Haemophilus influenzae NOT DETECTED NOT DETECTED Final   Neisseria meningitidis NOT DETECTED NOT DETECTED Final   Pseudomonas aeruginosa NOT DETECTED NOT DETECTED Final   Stenotrophomonas maltophilia NOT DETECTED NOT DETECTED Final   Candida albicans NOT DETECTED NOT DETECTED Final   Candida auris NOT DETECTED NOT DETECTED Final   Candida glabrata NOT DETECTED NOT DETECTED Final   Candida krusei NOT DETECTED NOT DETECTED Final   Candida parapsilosis NOT DETECTED NOT DETECTED Final   Candida tropicalis NOT DETECTED NOT DETECTED Final   Cryptococcus neoformans/gattii NOT DETECTED NOT DETECTED Final   CTX-M ESBL NOT DETECTED NOT DETECTED Final   Carbapenem resistance IMP NOT DETECTED NOT DETECTED Final   Carbapenem resistance KPC NOT DETECTED NOT DETECTED Final   Carbapenem resistance NDM NOT DETECTED NOT DETECTED Final   Carbapenem resist OXA 48 LIKE NOT  DETECTED NOT DETECTED Final   Carbapenem resistance VIM NOT DETECTED NOT DETECTED Final    Comment: Performed at [facility], [address] [place], [place] [address]  Resp panel by RT-PCR (RSV, Flu A&B, Covid) Anterior Nasal Swab     Status: None   Collection Time: 06/26/24 11:16 PM   Specimen: Anterior Nasal Swab  Result Value Ref Range Status   SARS Coronavirus 2 by RT PCR NEGATIVE NEGATIVE Final    Comment: (NOTE) SARS-CoV-2 target nucleic acids are NOT DETECTED.  The SARS-CoV-2 RNA is generally detectable in upper respiratory specimens during the acute phase of infection. The lowest concentration of SARS-CoV-2 viral copies this assay can detect is 138 copies/mL. A negative result does not preclude SARS-Cov-2 infection and should not be used as the sole basis for treatment or other patient management decisions. A negative result may occur with  improper specimen collection/handling, submission of specimen other than nasopharyngeal swab, presence of viral mutation(s) within the areas targeted by this assay, and inadequate number of viral copies(<138 copies/mL). A negative result must be combined with clinical observations, patient history, and epidemiological information. The expected result is Negative.  Fact Sheet for Patients:  bloggercourse.com  Fact Sheet for Healthcare Providers:  seriousbroker.it  This test is no t yet approved or cleared by the [place]  FDA and  has been authorized for detection and/or diagnosis of SARS-CoV-2 by FDA under an Emergency Use Authorization (EUA). This EUA will remain  in effect (meaning this test can be used) for the duration of the COVID-19 declaration under Section 564(b)(1) of the Act, 21 U.S.C.section 360bbb-3(b)(1), unless the authorization is terminated  or revoked sooner.       Influenza A by PCR NEGATIVE NEGATIVE Final   Influenza B by PCR NEGATIVE NEGATIVE  Final    Comment: (NOTE) The Xpert Xpress  SARS-CoV-2/FLU/RSV plus assay is intended as an aid in the diagnosis of influenza from Nasopharyngeal swab specimens and should not be used as a sole basis for treatment. Nasal washings and aspirates are unacceptable for Xpert Xpress SARS-CoV-2/FLU/RSV testing.  Fact Sheet for Patients: bloggercourse.com  Fact Sheet for Healthcare Providers: seriousbroker.it  This test is not yet approved or cleared by the [place]  FDA and has been authorized for detection and/or diagnosis of SARS-CoV-2 by FDA under an Emergency Use Authorization (EUA). This EUA will remain in effect (meaning this test can be used) for the duration of the COVID-19 declaration under Section 564(b)(1) of the Act, 21 U.S.C. section 360bbb-3(b)(1), unless the authorization is terminated or revoked.     Resp Syncytial Virus by PCR NEGATIVE NEGATIVE Final    Comment: (NOTE) Fact Sheet for Patients: bloggercourse.com  Fact Sheet for Healthcare Providers: seriousbroker.it  This test is not yet approved or cleared by the [place]  FDA and has been authorized for detection and/or diagnosis of SARS-CoV-2 by FDA under an Emergency Use Authorization (EUA). This EUA will remain in effect (meaning this test can be used) for the duration of the COVID-19 declaration under Section 564(b)(1) of the Act, 21 U.S.C. section 360bbb-3(b)(1), unless the authorization is terminated or revoked.  Performed at [facility], [address] [place] [address] [place], [place] [address]   Urine Culture     Status: Abnormal   Collection Time: 06/27/24 12:08 AM   Specimen: Urine, Catheterized  Result Value Ref Range Status   Specimen Description   Final    URINE, CATHETERIZED Performed at [facility], [address] [place], [place] [address]    Special Requests   Final     NONE Performed at [facility], [address] [place], [place] [address]    Culture (A)  Final    >=100,000 COLONIES/mL KLEBSIELLA OXYTOCA 40,000 COLONIES/mL PROTEUS MIRABILIS >=100,000 COLONIES/mL ENTEROCOCCUS FAECALIS    Report Status 06/30/2024 FINAL  Final   Organism ID, Bacteria ENTEROCOCCUS FAECALIS (A)  Final   Organism ID, Bacteria KLEBSIELLA OXYTOCA (A)  Final   Organism ID, Bacteria PROTEUS MIRABILIS (A)  Final      Susceptibility   Enterococcus faecalis - MIC*    AMPICILLIN <=2 SENSITIVE Sensitive     NITROFURANTOIN <=16 SENSITIVE Sensitive     VANCOMYCIN  <=0.5 SENSITIVE Sensitive     * >=100,000 COLONIES/mL ENTEROCOCCUS FAECALIS   Klebsiella oxytoca - MIC*    AMPICILLIN >=32 RESISTANT Resistant     CEFEPIME  <=0.12 SENSITIVE Sensitive     ERTAPENEM <=0.12 SENSITIVE Sensitive     CEFTRIAXONE  <=0.25 SENSITIVE Sensitive     CIPROFLOXACIN  1 RESISTANT Resistant     GENTAMICIN <=1 SENSITIVE Sensitive     NITROFURANTOIN <=16 SENSITIVE Sensitive     TRIMETH/SULFA >=320 RESISTANT Resistant     AMPICILLIN/SULBACTAM >=32 RESISTANT Resistant     PIP/TAZO Value in next row Sensitive      16 SENSITIVEThis is a modified FDA-approved test that has been validated and its performance characteristics determined by the reporting laboratory.  This laboratory is certified under the Clinical Laboratory Improvement Amendments CLIA as qualified to perform high complexity clinical laboratory testing.    MEROPENEM  Value in next row Sensitive      16 SENSITIVEThis is a modified FDA-approved test that has been validated and its performance characteristics determined by the reporting laboratory.  This laboratory is certified under the Clinical Laboratory Improvement Amendments CLIA as qualified to perform high complexity clinical laboratory testing.    * >=  100,000 COLONIES/mL KLEBSIELLA OXYTOCA   Proteus mirabilis - MIC*    AMPICILLIN Value in next row Resistant      16 SENSITIVEThis is a  modified FDA-approved test that has been validated and its performance characteristics determined by the reporting laboratory.  This laboratory is certified under the Clinical Laboratory Improvement Amendments CLIA as qualified to perform high complexity clinical laboratory testing.    CEFAZOLIN  (URINE) Value in next row Sensitive      4 SENSITIVEThis is a modified FDA-approved test that has been validated and its performance characteristics determined by the reporting laboratory.  This laboratory is certified under the Clinical Laboratory Improvement Amendments CLIA as qualified to perform high complexity clinical laboratory testing.    CEFEPIME  Value in next row Sensitive      4 SENSITIVEThis is a modified FDA-approved test that has been validated and its performance characteristics determined by the reporting laboratory.  This laboratory is certified under the Clinical Laboratory Improvement Amendments CLIA as qualified to perform high complexity clinical laboratory testing.    ERTAPENEM Value in next row Sensitive      4 SENSITIVEThis is a modified FDA-approved test that has been validated and its performance characteristics determined by the reporting laboratory.  This laboratory is certified under the Clinical Laboratory Improvement Amendments CLIA as qualified to perform high complexity clinical laboratory testing.    CEFTRIAXONE  Value in next row Sensitive      4 SENSITIVEThis is a modified FDA-approved test that has been validated and its performance characteristics determined by the reporting laboratory.  This laboratory is certified under the Clinical Laboratory Improvement Amendments CLIA as qualified to perform high complexity clinical laboratory testing.    CIPROFLOXACIN  Value in next row Resistant      4 SENSITIVEThis is a modified FDA-approved test that has been validated and its performance characteristics determined by the reporting laboratory.  This laboratory is certified under the  Clinical Laboratory Improvement Amendments CLIA as qualified to perform high complexity clinical laboratory testing.    GENTAMICIN Value in next row Sensitive      4 SENSITIVEThis is a modified FDA-approved test that has been validated and its performance characteristics determined by the reporting laboratory.  This laboratory is certified under the Clinical Laboratory Improvement Amendments CLIA as qualified to perform high complexity clinical laboratory testing.    NITROFURANTOIN Value in next row Resistant      4 SENSITIVEThis is a modified FDA-approved test that has been validated and its performance characteristics determined by the reporting laboratory.  This laboratory is certified under the Clinical Laboratory Improvement Amendments CLIA as qualified to perform high complexity clinical laboratory testing.    TRIMETH/SULFA Value in next row Resistant      4 SENSITIVEThis is a modified FDA-approved test that has been validated and its performance characteristics determined by the reporting laboratory.  This laboratory is certified under the Clinical Laboratory Improvement Amendments CLIA as qualified to perform high complexity clinical laboratory testing.    AMPICILLIN/SULBACTAM Value in next row Sensitive      4 SENSITIVEThis is a modified FDA-approved test that has been validated and its performance characteristics determined by the reporting laboratory.  This laboratory is certified under the Clinical Laboratory Improvement Amendments CLIA as qualified to perform high complexity clinical laboratory testing.    PIP/TAZO Value in next row Sensitive      <=4 SENSITIVEThis is a modified FDA-approved test that has been validated and its performance characteristics determined by the reporting laboratory.  This laboratory  is certified under the Clinical Laboratory Improvement Amendments CLIA as qualified to perform high complexity clinical laboratory testing.    MEROPENEM  Value in next row Sensitive       <=4 SENSITIVEThis is a modified FDA-approved test that has been validated and its performance characteristics determined by the reporting laboratory.  This laboratory is certified under the Clinical Laboratory Improvement Amendments CLIA as qualified to perform high complexity clinical laboratory testing.    * 40,000 COLONIES/mL PROTEUS MIRABILIS         Radiology Studies: ECHOCARDIOGRAM COMPLETE Result Date: 06/30/2024    ECHOCARDIOGRAM REPORT   Patient Name:   ALMER BUSHEY Date of Exam: 06/30/2024 Medical Rec #:  [id number]      Height: Accession #:    [id number]     Weight: Date of Birth:  [birth year]     BSA: Patient Age:    72 years       BP:           122/67 mmHg Patient Gender: M              HR:           45 bpm. Exam Location:  [facility] Procedure: 2D Echo, Cardiac Doppler, Color Doppler, 3D Echo and Strain Analysis            (Both Spectral and Color Flow Doppler were utilized during            procedure). Indications:     Bacteremia R78.81  History:         Patient has prior history of Echocardiogram examinations, most                  recent 12/24/2022. CAD. Chest pain.  Sonographer:     Christopher Furnace Referring Phys:  [id number] LANETA BLUNT Diagnosing Phys: Lonni Hanson MD  Sonographer Comments: Global longitudinal strain was attempted. IMPRESSIONS  1. Left ventricular ejection fraction, by estimation, is 55 to 60%. The left ventricle has normal function. The left ventricle has no regional wall motion abnormalities. There is mild left ventricular hypertrophy. Left ventricular diastolic parameters were normal. The average left ventricular global longitudinal strain is -18.1 %. The global longitudinal strain is normal.  2. Right ventricular systolic function is normal. The right ventricular size is normal. Tricuspid regurgitation signal is inadequate for assessing PA pressure.  3. Left atrial size was mildly dilated.  4. The mitral valve is normal in structure. Trivial mitral valve regurgitation.  No evidence of mitral stenosis.  5. The aortic valve is tricuspid. Aortic valve regurgitation is not visualized. No aortic stenosis is present. FINDINGS  Left Ventricle: Left ventricular ejection fraction, by estimation, is 55 to 60%. The left ventricle has normal function. The left ventricle has no regional wall motion abnormalities. The average left ventricular global longitudinal strain is -18.1 %. Strain was performed and the global longitudinal strain is normal. The left ventricular internal cavity size was normal in size. There is mild left ventricular hypertrophy. Left ventricular diastolic parameters were normal. Right Ventricle: The right ventricular size is normal. No increase in right ventricular wall thickness. Right ventricular systolic function is normal. Tricuspid regurgitation signal is inadequate for assessing PA pressure. Left Atrium: Left atrial size was mildly dilated. Right Atrium: Right atrial size was normal in size. Pericardium: The pericardium was not well visualized. Mitral Valve: The mitral valve is normal in structure. Trivial mitral valve regurgitation. No evidence of mitral valve stenosis. MV peak gradient, 3.9 mmHg. The mean mitral  valve gradient is 1.0 mmHg. Tricuspid Valve: The tricuspid valve is grossly normal. Tricuspid valve regurgitation is trivial. Aortic Valve: The aortic valve is tricuspid. Aortic valve regurgitation is not visualized. No aortic stenosis is present. Aortic valve mean gradient measures 4.0 mmHg. Aortic valve peak gradient measures 8.5 mmHg. Aortic valve area, by VTI measures 3.10 cm. Pulmonic Valve: The pulmonic valve was not well visualized. Pulmonic valve regurgitation is not visualized. No evidence of pulmonic stenosis. Aorta: The aortic root is normal in size and structure. Pulmonary Artery: The pulmonary artery is of normal size. IAS/Shunts: The interatrial septum was not well visualized.  LEFT VENTRICLE PLAX 2D LVIDd:         5.30 cm   Diastology LVIDs:          3.60 cm   LV e' medial:    14.00 cm/s LV PW:         1.30 cm   LV E/e' medial:  6.8 LV IVS:        1.30 cm   LV e' lateral:   14.90 cm/s LVOT diam:     2.20 cm   LV E/e' lateral: 6.4 LV SV:         86 LVOT Area:     3.80 cm  2D Longitudinal Strain LV IVRT:       98 msec   2D Strain GLS Avg:     -18.1 %  RIGHT VENTRICLE RV Basal diam:  3.60 cm RV Mid diam:    3.40 cm RV S prime:     17.40 cm/s LEFT ATRIUM           RIGHT ATRIUM LA diam:      4.20 cm RA Area:     17.60 cm LA Vol (A2C): 54.7 ml RA Volume:   39.30 ml LA Vol (A4C): 54.6 ml  AORTIC VALVE AV Area (Vmax):    2.99 cm AV Area (Vmean):   2.90 cm AV Area (VTI):     3.10 cm AV Vmax:           146.00 cm/s AV Vmean:          93.600 cm/s AV VTI:            0.276 m AV Peak Grad:      8.5 mmHg AV Mean Grad:      4.0 mmHg LVOT Vmax:         115.00 cm/s LVOT Vmean:        71.300 cm/s LVOT VTI:          0.225 m LVOT/AV VTI ratio: 0.82  AORTA Ao Root diam: 3.00 cm MITRAL VALVE MV Area (PHT): 2.17 cm    SHUNTS MV Area VTI:   1.95 cm    Systemic VTI:  0.22 m MV Peak grad:  3.9 mmHg    Systemic Diam: 2.20 cm MV Mean grad:  1.0 mmHg MV Vmax:       0.98 m/s MV Vmean:      55.7 cm/s MV Decel Time: 350 msec MV E velocity: 95.40 cm/s MV A velocity: 60.30 cm/s MV E/A ratio:  1.58 Christopher End MD Electronically signed by Lonni Hanson MD Signature Date/Time: 06/30/2024/3:43:07 PM    Final      Scheduled Meds:  aspirin   81 mg Oral Daily   atorvastatin   40 mg Oral Daily   Chlorhexidine  Gluconate Cloth  6 each Topical Daily   citalopram   20 mg Oral Daily   feeding supplement  237  mL Oral TID BM   gabapentin   300 mg Oral BID   guaiFENesin   600 mg Oral BID   lactulose   45 g Oral TID   methocarbamol   500 mg Oral QID   multivitamin with minerals  1 tablet Oral Daily   nystatin  1 Application Topical TID   pantoprazole   40 mg Oral Daily   QUEtiapine   50 mg Oral QHS   rifaximin   550 mg Oral BID   spironolactone   12.5 mg Oral Daily   Continuous  Infusions:  cefTRIAXone  (ROCEPHIN )  IV Stopped (07/01/24 1945)     LOS: 6 days    Angel Weedon Al-Sultani, MD [facility]   If 7PM-7AM, please contact night-coverage  07/02/2024, 8:35 AM

## 2024-07-02 NOTE — Progress Notes (Deleted)
 Mobility Specialist - Progress Note   07/02/24 1700  Mobility  Activity Ambulated with assistance;Respositioned in chair  Level of Assistance Contact guard assist, steadying assist  Assistive Device Front wheel walker (O2 tank)  Distance Ambulated (ft) 15 ft  Range of Motion/Exercises All extremities  Activity Response Tolerated well  Mobility visit 1 Mobility  Mobility Specialist Start Time (ACUTE ONLY) 1707  Mobility Specialist Stop Time (ACUTE ONLY) 1725  Mobility Specialist Time Calculation (min) (ACUTE ONLY) 18 min   Pt was in bathroom upon entry. Pt agreed to mobility. Pt is on O2 @ 2L. Pt ambulated within the room and stated that she had a full day. After activity pt repositioned in the recliner with needs in reach. Clem Rodes Mobility Specialist 07/02/24, 5:29 PM

## 2024-07-02 NOTE — Progress Notes (Signed)
 Mobility Specialist - Progress Note   07/02/24 1500  Mobility  Activity Ambulated with assistance;Stood at bedside;Dangled on edge of bed  Level of Assistance Contact guard assist, steadying assist  Assistive Device Front wheel walker  Distance Ambulated (ft) 50 ft  Range of Motion/Exercises All extremities  Activity Response Tolerated well  Mobility visit 1 Mobility  Mobility Specialist Start Time (ACUTE ONLY) 1145  Mobility Specialist Stop Time (ACUTE ONLY) 1217  Mobility Specialist Time Calculation (min) (ACUTE ONLY) 32 min   Pt was supine in bed on RA upon entry. Pt agreed to mobility. Pt is able with encouragement today to get to the EOB independently. Pt is able today to STS with 2WW. Pt ambulated well today. Pt did need a recovery break at the end of the activity.After activity pt returned to the bed supine with needs in reach.  Clem Rodes Mobility Specialist 07/02/24, 3:36 PM

## 2024-07-03 DIAGNOSIS — R6520 Severe sepsis without septic shock: Secondary | ICD-10-CM | POA: Diagnosis not present

## 2024-07-03 DIAGNOSIS — N1831 Chronic kidney disease, stage 3a: Secondary | ICD-10-CM

## 2024-07-03 DIAGNOSIS — K7682 Hepatic encephalopathy: Secondary | ICD-10-CM

## 2024-07-03 DIAGNOSIS — N179 Acute kidney failure, unspecified: Secondary | ICD-10-CM | POA: Diagnosis not present

## 2024-07-03 DIAGNOSIS — A4150 Gram-negative sepsis, unspecified: Secondary | ICD-10-CM

## 2024-07-03 DIAGNOSIS — D509 Iron deficiency anemia, unspecified: Secondary | ICD-10-CM | POA: Insufficient documentation

## 2024-07-03 LAB — BASIC METABOLIC PANEL WITH GFR
Anion gap: 4 — ABNORMAL LOW (ref 5–15)
BUN: 27 mg/dL — ABNORMAL HIGH (ref 8–23)
CO2: 25 mmol/L (ref 22–32)
Calcium: 9.7 mg/dL (ref 8.9–10.3)
Chloride: 106 mmol/L (ref 98–111)
Creatinine, Ser: 1.47 mg/dL — ABNORMAL HIGH (ref 0.61–1.24)
GFR, Estimated: 50 mL/min — ABNORMAL LOW (ref >60)
Glucose, Bld: 123 mg/dL — ABNORMAL HIGH (ref 70–99)
Potassium: 4.5 mmol/L (ref 3.5–5.1)
Sodium: 135 mmol/L (ref 135–145)

## 2024-07-03 LAB — CBC
HCT: 26.5 % — ABNORMAL LOW (ref 39.0–52.0)
Hemoglobin: 8.5 g/dL — ABNORMAL LOW (ref 13.0–17.0)
MCH: 28.2 pg (ref 26.0–34.0)
MCHC: 32.1 g/dL (ref 30.0–36.0)
MCV: 88 fL (ref 80.0–100.0)
Platelets: 80 K/uL — ABNORMAL LOW (ref 150–400)
RBC: 3.01 MIL/uL — ABNORMAL LOW (ref 4.22–5.81)
RDW: 16.9 % — ABNORMAL HIGH (ref 11.5–15.5)
WBC: 3.4 K/uL — ABNORMAL LOW (ref 4.0–10.5)
nRBC: 0 % (ref 0.0–0.2)

## 2024-07-03 LAB — TROPONIN I (HIGH SENSITIVITY)
Troponin I (High Sensitivity): 11 ng/L (ref <18)
Troponin I (High Sensitivity): 12 ng/L (ref <18)

## 2024-07-03 MED ORDER — SPIRONOLACTONE 25 MG PO TABS: 12.5000 mg | ORAL_TABLET | Freq: Every day | ORAL | 0 refills | Status: AC

## 2024-07-03 MED ORDER — NYSTATIN 100000 UNIT/GM EX POWD: 1.0000 | Freq: Three times a day (TID) | CUTANEOUS | 0 refills | Status: AC

## 2024-07-03 NOTE — Discharge Summary (Signed)
 Physician Discharge Summary   Patient: [name] MRN: [id number] DOB: [birth year]  Admit date:     06/26/2024  Discharge date: 07/03/24  Discharge Physician: Duffy Al-Sultani   PCP: [facility]   Recommendations at discharge:   Follow up with PCP within 1 week of discharge. Repeat CBC and CMP.  Follow creatinine to ensure complete resolution of AKI.  Monitor platelet count, thrombocytopenia evaluation per PCP Follow-up with interventional radiology for rescheduling of missed placement of suprapubic catheter appointment  Discharge Diagnoses: Principal Problem:   Sepsis due to Gram-negative organism with acute renal failure (HCC) Active Problems:   Gram-negative bacteremia   UTI (urinary tract infection)   AKI (acute kidney injury)   Hyponatremia   Acute hepatic encephalopathy (HCC)   Acute on chronic anemia   Thrombocytopenia   Chronic kidney disease, stage 3a (HCC)   Depression   Chronic indwelling Foley catheter   Dyslipidemia   Peripheral neuropathy   GERD without esophagitis   Iron deficiency anemia   Hospital Course: [name] is a 73 y.o. male with medical history significant for diastolic CHF with EF of 60 to 34%, anemia, asthma, history of E. coli and Klebsiella pneumoniae bacteremia, cirrhosis, coronary artery disease, GERD, CKD3a, hypertension dyslipidemia, who presented to the ER with acute onset of fever that was up to 103.5 and chills with associated lethargy at his group home.   # Sepsis secondary to bacteremia and UTI # Klebsiella oxytoca bacteremia in the setting of complicated UTI with indwelling foley catheter - Presented with fever to 100.8 (Tmax 103.5 per EMS), tachypnea to 22, and AKI - UCx positive for Klebsiella oxytoca, Proteus mirabilis, Enterococcus faecalis - BCx (10/31) positive for Klebsiella oxytoca  - ID was consulted and recommended 7 days of IV antibiotics given resistance pattern of Klebsiella oxytoca - Completed  7-day course of IV Rocephin  2 g daily on 07/03/2024 - Afebrile with stable vital signs and no leukocytosis at time of discharge  # Chronic urinary retention # Chronic indwelling foley catheter - Patient was apparently scheduled for placement of suprapubic catheter with IR on 10/28, which did not happen - Patient will need to follow-up with IR to reschedule placement of suprapubic catheter.  Curbsided urology, indicated no urgency and placement this hospitalization   #AKI on CKD Stage IIIa - Patient presented with Cr of 2.16, baseline around 1.1-1.3 - Improved with IVF and PO intake - Creatinine trended down to 1.47 at the time of discharge - Follow-up with PCP to repeat BMP in monitor creatinine trend   # Thrombocytopenia, chronic - Secondary to liver disease - Platelet count 61 on arrival, has improved to 80 at the time discharge - Recommend follow-up with PCP for continued monitoring and further evaluation   # Hyponatremia - resolved - Na 130 on presentation, Na 135 at the time of discharge   # Acute on chronic anemia # IDA - Continue iron supplementation   # Cirrhosis # Acute hepatic encephalopathy - improved - Continue lactulose  and rifaximin  - Continue spironolactone  12.5 mg daily - Delirium precautions   # Depression - Continue Celexa  and Seroquel    # GERD - PPI   # Peripheral neuropathy - Continue Gabapentin    # Dyslipidemia - Continue statin    #Class I Obesity (BMI Body mass index is 33.45 kg/m. kg/m) - Obesity is clinically significant and contributes to cirrhosis, CKD, HLD, GERD.  - Follow up with PCP for continued discussion of lifestyle modification including dietary changes, regular  physical activity, and weight reduction strategies.         Consultants: Infectious disease Procedures performed: None Disposition: Group home Diet recommendation:  Diet Orders (From admission, onward)     Start     Ordered   06/28/24 1402  Diet regular Room  service appropriate? Yes; Fluid consistency: Thin  Diet effective now       Question Answer Comment  Room service appropriate? Yes   Fluid consistency: Thin      06/28/24 1401           DISCHARGE MEDICATION: Allergies as of 07/03/2024       Reactions   Bee Venom Anaphylaxis   Codeine  Itching   Only itching. Recently allergy tested at [facility]   Rsv Mrna Pre-f Virus Vaccine Swelling   Meloxicam    Other Reaction(s): ?overdose of local given at dentist--had trouble breathing   Novocain [procaine]    Sulfa Antibiotics    hallucinations        Medication List     STOP taking these medications    furosemide  20 MG tablet Commonly known as: LASIX        TAKE these medications    acetaminophen  650 MG CR tablet Commonly known as: TYLENOL  Take 650 mg by mouth every 8 (eight) hours as needed for pain.   aspirin  81 MG chewable tablet Chew 81 mg by mouth daily.   atorvastatin  40 MG tablet Commonly known as: LIPITOR Take 40 mg by mouth daily.   benzocaine  10 % mucosal gel Commonly known as: ORAJEL Apply to the tip of the penis for penile pain up to four times daily prn for penile pain   citalopram  20 MG tablet Commonly known as: CELEXA  Take 20 mg by mouth daily.   feeding supplement Liqd Take 237 mLs by mouth 3 (three) times daily between meals.   gabapentin  300 MG capsule Commonly known as: NEURONTIN  Take 300 mg by mouth 2 (two) times daily.   lactulose  10 GM/15ML solution Commonly known as: CHRONULAC  Take 67.5 mLs (45 g total) by mouth 3 (three) times daily. Please titrate to have 2-3 soft bowel movements daily   methocarbamol  500 MG tablet Commonly known as: ROBAXIN  Take 500 mg by mouth 4 (four) times daily.   multivitamin with minerals Tabs tablet Take 1 tablet by mouth daily.   nitroGLYCERIN  0.4 MG/SPRAY spray Commonly known as: NITROLINGUAL  Place 1 spray under the tongue every 5 (five) minutes x 3 doses as needed for chest pain.   nystatin  powder Commonly known as: MYCOSTATIN/NYSTOP Apply 1 Application topically 3 (three) times daily for 20 days.   ondansetron  4 MG tablet Commonly known as: ZOFRAN  Take 1 tablet (4 mg total) by mouth every 6 (six) hours as needed for nausea.   ondansetron  8 MG disintegrating tablet Commonly known as: ZOFRAN -ODT Take 8 mg by mouth every 6 (six) hours as needed for nausea.   pantoprazole  40 MG tablet Commonly known as: PROTONIX  Take 40 mg by mouth daily.   phenol 1.4 % Liqd Commonly known as: CHLORASEPTIC Use as directed 1 spray in the mouth or throat as needed for throat irritation / pain.   QUEtiapine  50 MG tablet Commonly known as: SEROQUEL  Take 50 mg by mouth at bedtime.   senna 8.6 MG Tabs tablet Commonly known as: SENOKOT Take 1 tablet (8.6 mg total) by mouth 2 (two) times daily as needed for mild constipation.   spironolactone  25 MG tablet Commonly known as: ALDACTONE  Take 0.5 tablets (12.5 mg  total) by mouth daily. What changed: how much to take   Ventolin  HFA 108 (90 Base) MCG/ACT inhaler Generic drug: albuterol  Inhale 2 puffs into the lungs every 4 (four) hours as needed for wheezing.   albuterol  (2.5 MG/3ML) 0.083% nebulizer solution Commonly known as: PROVENTIL  Take 2.5 mg by nebulization every 4 (four) hours as needed for wheezing or shortness of breath.   Xifaxan  550 MG Tabs tablet Generic drug: rifaximin  Take 550 mg by mouth 2 (two) times daily.        Follow-up Information     [facility] Schedule an appointment as soon as possible for a visit in 1 week(s).   Contact information: [address] [place] [address] [phone]         Helon Kirsch A, PA-C Follow up in 2 week(s).   Specialty: Urology Contact information: [address] [place] [address] [phone]                Discharge Exam: Fredricka Weights   06/26/24 2101  Weight: 94 kg   General exam: Appears calm and  comfortable  Respiratory system: Clear to auscultation. Respiratory effort normal. Cardiovascular system: S1 & S2 heard, RRR. No JVD, murmurs. No pedal edema. Gastrointestinal system: Abdomen is nondistended, soft and nontender. No organomegaly or masses felt. Normal bowel sounds heard. GU: foley catheter in place Central nervous system: Alert and oriented. No focal neurological deficits. Extremities: Symmetric 5 x 5 power. Skin: No rashes, lesions or ulcers Psychiatry: Judgement and insight appear normal. Mood & affect appropriate.   Condition at discharge: good  The results of significant diagnostics from this hospitalization (including imaging, microbiology, ancillary and laboratory) are listed below for reference.   Imaging Studies: ECHOCARDIOGRAM COMPLETE Result Date: 06/30/2024    ECHOCARDIOGRAM REPORT   Patient Name:   JUSIAH AGUAYO Date of Exam: 06/30/2024 Medical Rec #:  [id number]      Height: Accession #:    [id number]     Weight: Date of Birth:  Dec 10, 1950     BSA: Patient Age:    72 years       BP:           122/67 mmHg Patient Gender: M              HR:           45 bpm. Exam Location:  [facility] Procedure: 2D Echo, Cardiac Doppler, Color Doppler, 3D Echo and Strain Analysis            (Both Spectral and Color Flow Doppler were utilized during            procedure). Indications:     Bacteremia R78.81  History:         Patient has prior history of Echocardiogram examinations, most                  recent 12/24/2022. CAD. Chest pain.  Sonographer:     Christopher Furnace Referring Phys:  [id number] LANETA BLUNT Diagnosing Phys: Lonni Hanson MD  Sonographer Comments: Global longitudinal strain was attempted. IMPRESSIONS  1. Left ventricular ejection fraction, by estimation, is 55 to 60%. The left ventricle has normal function. The left ventricle has no regional wall motion abnormalities. There is mild left ventricular hypertrophy. Left ventricular diastolic parameters were normal. The average left  ventricular global longitudinal strain is -18.1 %. The global longitudinal strain is normal.  2. Right ventricular systolic function is normal. The right ventricular  size is normal. Tricuspid regurgitation signal is inadequate for assessing PA pressure.  3. Left atrial size was mildly dilated.  4. The mitral valve is normal in structure. Trivial mitral valve regurgitation. No evidence of mitral stenosis.  5. The aortic valve is tricuspid. Aortic valve regurgitation is not visualized. No aortic stenosis is present. FINDINGS  Left Ventricle: Left ventricular ejection fraction, by estimation, is 55 to 60%. The left ventricle has normal function. The left ventricle has no regional wall motion abnormalities. The average left ventricular global longitudinal strain is -18.1 %. Strain was performed and the global longitudinal strain is normal. The left ventricular internal cavity size was normal in size. There is mild left ventricular hypertrophy. Left ventricular diastolic parameters were normal. Right Ventricle: The right ventricular size is normal. No increase in right ventricular wall thickness. Right ventricular systolic function is normal. Tricuspid regurgitation signal is inadequate for assessing PA pressure. Left Atrium: Left atrial size was mildly dilated. Right Atrium: Right atrial size was normal in size. Pericardium: The pericardium was not well visualized. Mitral Valve: The mitral valve is normal in structure. Trivial mitral valve regurgitation. No evidence of mitral valve stenosis. MV peak gradient, 3.9 mmHg. The mean mitral valve gradient is 1.0 mmHg. Tricuspid Valve: The tricuspid valve is grossly normal. Tricuspid valve regurgitation is trivial. Aortic Valve: The aortic valve is tricuspid. Aortic valve regurgitation is not visualized. No aortic stenosis is present. Aortic valve mean gradient measures 4.0 mmHg. Aortic valve peak gradient measures 8.5 mmHg. Aortic valve area, by VTI measures 3.10 cm.  Pulmonic Valve: The pulmonic valve was not well visualized. Pulmonic valve regurgitation is not visualized. No evidence of pulmonic stenosis. Aorta: The aortic root is normal in size and structure. Pulmonary Artery: The pulmonary artery is of normal size. IAS/Shunts: The interatrial septum was not well visualized.  LEFT VENTRICLE PLAX 2D LVIDd:         5.30 cm   Diastology LVIDs:         3.60 cm   LV e' medial:    14.00 cm/s LV PW:         1.30 cm   LV E/e' medial:  6.8 LV IVS:        1.30 cm   LV e' lateral:   14.90 cm/s LVOT diam:     2.20 cm   LV E/e' lateral: 6.4 LV SV:         86 LVOT Area:     3.80 cm  2D Longitudinal Strain LV IVRT:       98 msec   2D Strain GLS Avg:     -18.1 %  RIGHT VENTRICLE RV Basal diam:  3.60 cm RV Mid diam:    3.40 cm RV S prime:     17.40 cm/s LEFT ATRIUM           RIGHT ATRIUM LA diam:      4.20 cm RA Area:     17.60 cm LA Vol (A2C): 54.7 ml RA Volume:   39.30 ml LA Vol (A4C): 54.6 ml  AORTIC VALVE AV Area (Vmax):    2.99 cm AV Area (Vmean):   2.90 cm AV Area (VTI):     3.10 cm AV Vmax:           146.00 cm/s AV Vmean:          93.600 cm/s AV VTI:            0.276 m AV Peak Grad:  8.5 mmHg AV Mean Grad:      4.0 mmHg LVOT Vmax:         115.00 cm/s LVOT Vmean:        71.300 cm/s LVOT VTI:          0.225 m LVOT/AV VTI ratio: 0.82  AORTA Ao Root diam: 3.00 cm MITRAL VALVE MV Area (PHT): 2.17 cm    SHUNTS MV Area VTI:   1.95 cm    Systemic VTI:  0.22 m MV Peak grad:  3.9 mmHg    Systemic Diam: 2.20 cm MV Mean grad:  1.0 mmHg MV Vmax:       0.98 m/s MV Vmean:      55.7 cm/s MV Decel Time: 350 msec MV E velocity: 95.40 cm/s MV A velocity: 60.30 cm/s MV E/A ratio:  1.58 Lonni End MD Electronically signed by Lonni Hanson MD Signature Date/Time: 06/30/2024/3:43:07 PM    Final    DG Chest Portable 1 View Result Date: 06/26/2024 CLINICAL DATA:  Short of breath, confusion, sepsis EXAM: PORTABLE CHEST 1 VIEW COMPARISON:  02/17/2024 FINDINGS: Single frontal view of the chest  demonstrates a stable cardiac silhouette. No acute airspace disease, effusion, or pneumothorax. No acute bony abnormalities. IMPRESSION: 1. No acute intrathoracic process. Electronically Signed   By: Ozell Daring M.D.   On: 06/26/2024 22:22    Microbiology: Results for orders placed or performed during the hospital encounter of 06/26/24  Blood culture (routine x 2)     Status: Abnormal   Collection Time: 06/26/24  9:27 PM   Specimen: BLOOD  Result Value Ref Range Status   Specimen Description   Final    BLOOD BLOOD LEFT ARM Performed at [facility], [address], [place], [place] [address]    Special Requests   Final    BOTTLES DRAWN AEROBIC AND ANAEROBIC Blood Culture results may not be optimal due to an excessive volume of blood received in culture bottles Performed at [facility], [address], [place], [place] [address]    Culture  Setup Time   Final    GRAM NEGATIVE RODS IN BOTH AEROBIC AND ANAEROBIC BOTTLES CRITICAL VALUE NOTED.  VALUE IS CONSISTENT WITH PREVIOUSLY REPORTED AND CALLED VALUE. Performed at [facility], [address] [place], [place] [address]    Culture (A)  Final    KLEBSIELLA OXYTOCA SUSCEPTIBILITIES PERFORMED ON PREVIOUS CULTURE WITHIN THE LAST 5 DAYS. Performed at [facility], [address], [place], [place] [address]    Report Status 06/29/2024 FINAL  Final  Blood culture (routine x 2)     Status: Abnormal   Collection Time: 06/26/24  9:27 PM   Specimen: BLOOD  Result Value Ref Range Status   Specimen Description   Final    BLOOD BLOOD RIGHT ARM Performed at [facility], [address], [place], [place] [address]    Special Requests   Final    BOTTLES DRAWN AEROBIC AND ANAEROBIC Blood Culture adequate volume Performed at [facility], [address], [place], [place] [address]    Culture  Setup Time   Final    GRAM NEGATIVE RODS IN BOTH AEROBIC AND ANAEROBIC BOTTLES CRITICAL  RESULT CALLED TO, READ BACK BY AND VERIFIED WITH: ALEX CHAPPELL ON 06/27/24 AT 1129 RAM/QSD Performed at [facility], [address] [place], [place] [address]    Culture KLEBSIELLA OXYTOCA (A)  Final   Report Status 06/29/2024 FINAL  Final  Organism ID, Bacteria KLEBSIELLA OXYTOCA  Final      Susceptibility   Klebsiella oxytoca - MIC*    AMPICILLIN >=32 RESISTANT Resistant     CEFEPIME  <=0.12 SENSITIVE Sensitive     ERTAPENEM <=0.12 SENSITIVE Sensitive     CEFTRIAXONE  <=0.25 SENSITIVE Sensitive     CIPROFLOXACIN  1 RESISTANT Resistant     GENTAMICIN <=1 SENSITIVE Sensitive     MEROPENEM  <=0.25 SENSITIVE Sensitive     TRIMETH/SULFA >=320 RESISTANT Resistant     AMPICILLIN/SULBACTAM >=32 RESISTANT Resistant     PIP/TAZO Value in next row Sensitive      16 SENSITIVEThis is a modified FDA-approved test that has been validated and its performance characteristics determined by the reporting laboratory.  This laboratory is certified under the Clinical Laboratory Improvement Amendments CLIA as qualified to perform high complexity clinical laboratory testing.    * KLEBSIELLA OXYTOCA  Blood Culture ID Panel (Reflexed)     Status: Abnormal   Collection Time: 06/26/24  9:27 PM  Result Value Ref Range Status   Enterococcus faecalis NOT DETECTED NOT DETECTED Final   Enterococcus Faecium NOT DETECTED NOT DETECTED Final   Listeria monocytogenes NOT DETECTED NOT DETECTED Final   Staphylococcus species NOT DETECTED NOT DETECTED Final   Staphylococcus aureus (BCID) NOT DETECTED NOT DETECTED Final   Staphylococcus epidermidis NOT DETECTED NOT DETECTED Final   Staphylococcus lugdunensis NOT DETECTED NOT DETECTED Final   Streptococcus species NOT DETECTED NOT DETECTED Final   Streptococcus agalactiae NOT DETECTED NOT DETECTED Final   Streptococcus pneumoniae NOT DETECTED NOT DETECTED Final   Streptococcus pyogenes NOT DETECTED NOT DETECTED Final   A.calcoaceticus-baumannii NOT DETECTED NOT  DETECTED Final   Bacteroides fragilis NOT DETECTED NOT DETECTED Final   Enterobacterales DETECTED (A) NOT DETECTED Final    Comment: Enterobacterales represent a large order of gram negative bacteria, not a single organism. CRITICAL RESULT CALLED TO, READ BACK BY AND VERIFIED WITH: ALEX CHAPPELL ON 06/27/24 AT 1129 RAM/QSD    Enterobacter cloacae complex NOT DETECTED NOT DETECTED Final   Escherichia coli NOT DETECTED NOT DETECTED Final   Klebsiella aerogenes NOT DETECTED NOT DETECTED Final   Klebsiella oxytoca DETECTED (A) NOT DETECTED Final    Comment: CRITICAL RESULT CALLED TO, READ BACK BY AND VERIFIED WITH: ALEX CHAPPELL ON 06/27/24 AT 1129 RAM/QSD    Klebsiella pneumoniae NOT DETECTED NOT DETECTED Final   Proteus species NOT DETECTED NOT DETECTED Final   Salmonella species NOT DETECTED NOT DETECTED Final   Serratia marcescens NOT DETECTED NOT DETECTED Final   Haemophilus influenzae NOT DETECTED NOT DETECTED Final   Neisseria meningitidis NOT DETECTED NOT DETECTED Final   Pseudomonas aeruginosa NOT DETECTED NOT DETECTED Final   Stenotrophomonas maltophilia NOT DETECTED NOT DETECTED Final   Candida albicans NOT DETECTED NOT DETECTED Final   Candida auris NOT DETECTED NOT DETECTED Final   Candida glabrata NOT DETECTED NOT DETECTED Final   Candida krusei NOT DETECTED NOT DETECTED Final   Candida parapsilosis NOT DETECTED NOT DETECTED Final   Candida tropicalis NOT DETECTED NOT DETECTED Final   Cryptococcus neoformans/gattii NOT DETECTED NOT DETECTED Final   CTX-M ESBL NOT DETECTED NOT DETECTED Final   Carbapenem resistance IMP NOT DETECTED NOT DETECTED Final   Carbapenem resistance KPC NOT DETECTED NOT DETECTED Final   Carbapenem resistance NDM NOT DETECTED NOT DETECTED Final   Carbapenem resist OXA 48 LIKE NOT DETECTED NOT DETECTED Final   Carbapenem resistance VIM NOT DETECTED NOT DETECTED Final    Comment: Performed at  [facility], [address], [place], [place]  [address]  Resp panel by RT-PCR (RSV, Flu A&B, Covid) Anterior Nasal Swab     Status: None   Collection Time: 06/26/24 11:16 PM   Specimen: Anterior Nasal Swab  Result Value Ref Range Status   SARS Coronavirus 2 by RT PCR NEGATIVE NEGATIVE Final    Comment: (NOTE) SARS-CoV-2 target nucleic acids are NOT DETECTED.  The SARS-CoV-2 RNA is generally detectable in upper respiratory specimens during the acute phase of infection. The lowest concentration of SARS-CoV-2 viral copies this assay can detect is 138 copies/mL. A negative result does not preclude SARS-Cov-2 infection and should not be used as the sole basis for treatment or other patient management decisions. A negative result may occur with  improper specimen collection/handling, submission of specimen other than nasopharyngeal swab, presence of viral mutation(s) within the areas targeted by this assay, and inadequate number of viral copies(<138 copies/mL). A negative result must be combined with clinical observations, patient history, and epidemiological information. The expected result is Negative.  Fact Sheet for Patients:  bloggercourse.com  Fact Sheet for Healthcare Providers:  seriousbroker.it  This test is no t yet approved or cleared by the [place]  FDA and  has been authorized for detection and/or diagnosis of SARS-CoV-2 by FDA under an Emergency Use Authorization (EUA). This EUA will remain  in effect (meaning this test can be used) for the duration of the COVID-19 declaration under Section 564(b)(1) of the Act, 21 U.S.C.section 360bbb-3(b)(1), unless the authorization is terminated  or revoked sooner.       Influenza A by PCR NEGATIVE NEGATIVE Final   Influenza B by PCR NEGATIVE NEGATIVE Final    Comment: (NOTE) The Xpert Xpress SARS-CoV-2/FLU/RSV plus assay is intended as an aid in the diagnosis of influenza from Nasopharyngeal swab specimens and should not be  used as a sole basis for treatment. Nasal washings and aspirates are unacceptable for Xpert Xpress SARS-CoV-2/FLU/RSV testing.  Fact Sheet for Patients: bloggercourse.com  Fact Sheet for Healthcare Providers: seriousbroker.it  This test is not yet approved or cleared by the [place]  FDA and has been authorized for detection and/or diagnosis of SARS-CoV-2 by FDA under an Emergency Use Authorization (EUA). This EUA will remain in effect (meaning this test can be used) for the duration of the COVID-19 declaration under Section 564(b)(1) of the Act, 21 U.S.C. section 360bbb-3(b)(1), unless the authorization is terminated or revoked.     Resp Syncytial Virus by PCR NEGATIVE NEGATIVE Final    Comment: (NOTE) Fact Sheet for Patients: bloggercourse.com  Fact Sheet for Healthcare Providers: seriousbroker.it  This test is not yet approved or cleared by the [place]  FDA and has been authorized for detection and/or diagnosis of SARS-CoV-2 by FDA under an Emergency Use Authorization (EUA). This EUA will remain in effect (meaning this test can be used) for the duration of the COVID-19 declaration under Section 564(b)(1) of the Act, 21 U.S.C. section 360bbb-3(b)(1), unless the authorization is terminated or revoked.  Performed at [facility], [address], [place], [place] [address]   Urine Culture     Status: Abnormal   Collection Time: 06/27/24 12:08 AM   Specimen: Urine, Catheterized  Result Value Ref Range Status   Specimen Description   Final    URINE, CATHETERIZED Performed at [facility], [address], [place], [place] [address]    Special Requests   Final    NONE Performed at [facility], [address] [place], [place] [address]    Culture (A)  Final    >=100,000 COLONIES/mL KLEBSIELLA OXYTOCA 40,000 COLONIES/mL PROTEUS  MIRABILIS >=100,000 COLONIES/mL ENTEROCOCCUS FAECALIS    Report Status 06/30/2024 FINAL  Final   Organism ID, Bacteria ENTEROCOCCUS FAECALIS (A)  Final   Organism ID, Bacteria KLEBSIELLA OXYTOCA (A)  Final   Organism ID, Bacteria PROTEUS MIRABILIS (A)  Final      Susceptibility   Enterococcus faecalis - MIC*    AMPICILLIN <=2 SENSITIVE Sensitive     NITROFURANTOIN <=16 SENSITIVE Sensitive     VANCOMYCIN  <=0.5 SENSITIVE Sensitive     * >=100,000 COLONIES/mL ENTEROCOCCUS FAECALIS   Klebsiella oxytoca - MIC*    AMPICILLIN >=32 RESISTANT Resistant     CEFEPIME  <=0.12 SENSITIVE Sensitive     ERTAPENEM <=0.12 SENSITIVE Sensitive     CEFTRIAXONE  <=0.25 SENSITIVE Sensitive     CIPROFLOXACIN  1 RESISTANT Resistant     GENTAMICIN <=1 SENSITIVE Sensitive     NITROFURANTOIN <=16 SENSITIVE Sensitive     TRIMETH/SULFA >=320 RESISTANT Resistant     AMPICILLIN/SULBACTAM >=32 RESISTANT Resistant     PIP/TAZO Value in next row Sensitive      16 SENSITIVEThis is a modified FDA-approved test that has been validated and its performance characteristics determined by the reporting laboratory.  This laboratory is certified under the Clinical Laboratory Improvement Amendments CLIA as qualified to perform high complexity clinical laboratory testing.    MEROPENEM  Value in next row Sensitive      16 SENSITIVEThis is a modified FDA-approved test that has been validated and its performance characteristics determined by the reporting laboratory.  This laboratory is certified under the Clinical Laboratory Improvement Amendments CLIA as qualified to perform high complexity clinical laboratory testing.    * >=100,000 COLONIES/mL KLEBSIELLA OXYTOCA   Proteus mirabilis - MIC*    AMPICILLIN Value in next row Resistant      16 SENSITIVEThis is a modified FDA-approved test that has been validated and its performance characteristics determined by the reporting laboratory.  This laboratory is certified under the Clinical  Laboratory Improvement Amendments CLIA as qualified to perform high complexity clinical laboratory testing.    CEFAZOLIN  (URINE) Value in next row Sensitive      4 SENSITIVEThis is a modified FDA-approved test that has been validated and its performance characteristics determined by the reporting laboratory.  This laboratory is certified under the Clinical Laboratory Improvement Amendments CLIA as qualified to perform high complexity clinical laboratory testing.    CEFEPIME  Value in next row Sensitive      4 SENSITIVEThis is a modified FDA-approved test that has been validated and its performance characteristics determined by the reporting laboratory.  This laboratory is certified under the Clinical Laboratory Improvement Amendments CLIA as qualified to perform high complexity clinical laboratory testing.    ERTAPENEM Value in next row Sensitive      4 SENSITIVEThis is a modified FDA-approved test that has been validated and its performance characteristics determined by the reporting laboratory.  This laboratory is certified under the Clinical Laboratory Improvement Amendments CLIA as qualified to perform high complexity clinical laboratory testing.    CEFTRIAXONE  Value in next row Sensitive      4 SENSITIVEThis is a modified FDA-approved test that has been validated and its performance characteristics determined by the reporting laboratory.  This laboratory is certified under the Clinical Laboratory Improvement Amendments CLIA as qualified to perform high complexity clinical laboratory testing.    CIPROFLOXACIN  Value in next row Resistant  4 SENSITIVEThis is a modified FDA-approved test that has been validated and its performance characteristics determined by the reporting laboratory.  This laboratory is certified under the Clinical Laboratory Improvement Amendments CLIA as qualified to perform high complexity clinical laboratory testing.    GENTAMICIN Value in next row Sensitive      4  SENSITIVEThis is a modified FDA-approved test that has been validated and its performance characteristics determined by the reporting laboratory.  This laboratory is certified under the Clinical Laboratory Improvement Amendments CLIA as qualified to perform high complexity clinical laboratory testing.    NITROFURANTOIN Value in next row Resistant      4 SENSITIVEThis is a modified FDA-approved test that has been validated and its performance characteristics determined by the reporting laboratory.  This laboratory is certified under the Clinical Laboratory Improvement Amendments CLIA as qualified to perform high complexity clinical laboratory testing.    TRIMETH/SULFA Value in next row Resistant      4 SENSITIVEThis is a modified FDA-approved test that has been validated and its performance characteristics determined by the reporting laboratory.  This laboratory is certified under the Clinical Laboratory Improvement Amendments CLIA as qualified to perform high complexity clinical laboratory testing.    AMPICILLIN/SULBACTAM Value in next row Sensitive      4 SENSITIVEThis is a modified FDA-approved test that has been validated and its performance characteristics determined by the reporting laboratory.  This laboratory is certified under the Clinical Laboratory Improvement Amendments CLIA as qualified to perform high complexity clinical laboratory testing.    PIP/TAZO Value in next row Sensitive      <=4 SENSITIVEThis is a modified FDA-approved test that has been validated and its performance characteristics determined by the reporting laboratory.  This laboratory is certified under the Clinical Laboratory Improvement Amendments CLIA as qualified to perform high complexity clinical laboratory testing.    MEROPENEM  Value in next row Sensitive      <=4 SENSITIVEThis is a modified FDA-approved test that has been validated and its performance characteristics determined by the reporting laboratory.  This  laboratory is certified under the Clinical Laboratory Improvement Amendments CLIA as qualified to perform high complexity clinical laboratory testing.    * 40,000 COLONIES/mL PROTEUS MIRABILIS    Labs: CBC: Recent Labs  Lab 06/26/24 2100 06/27/24 0523 06/28/24 0604 06/29/24 0515 06/30/24 0510 07/02/24 0454 07/03/24 0616  WBC 11.5*   < > 5.8 4.2 3.5* 3.8* 3.4*  NEUTROABS 9.8*  --   --   --   --   --   --   HGB 9.6*   < > 9.4* 9.2* 9.4* 9.0* 8.5*  HCT 29.3*   < > 28.6* 27.7* 29.5* 27.9* 26.5*  MCV 89.1   < > 88.8 86.8 91.0 88.6 88.0  PLT 61*   < > 59* 63* 66* 74* 80*   < > = values in this interval not displayed.   Basic Metabolic Panel: Recent Labs  Lab 06/26/24 2130 06/27/24 0523 06/28/24 0604 06/29/24 0515 06/30/24 0510 07/02/24 0454 07/03/24 0616  NA  --    < > 137 136 133* 137 135  K  --    < > 4.3 4.3 4.1 4.3 4.5  CL  --    < > 104 108 104 107 106  CO2  --    < > 22 23 25 25 25   GLUCOSE  --    < > 113* 76 88 111* 123*  BUN  --    < > 35* 31*  31* 30* 27*  CREATININE  --    < > 1.91* 1.72* 1.71* 1.51* 1.47*  CALCIUM   --    < > 9.7 9.5 9.7 9.8 9.7  MG 1.7  --   --   --   --   --   --    < > = values in this interval not displayed.   Liver Function Tests: Recent Labs  Lab 06/26/24 2100  AST 53*  ALT 28  ALKPHOS 72  BILITOT 2.1*  PROT 6.4*  ALBUMIN  2.1*   CBG: No results for input(s): GLUCAP in the last 168 hours.  Discharge time spent: 35 minutes  Signed: Duffy Larch, MD [facility] 07/03/2024

## 2024-07-03 NOTE — NC FL2 (Signed)
 [place]  MEDICAID [id number] LEVEL OF CARE FORM     IDENTIFICATION  Patient Name: [name] Birthdate: 06-May-1951 Sex: male Admission Date (Current Location): 06/26/2024  [place] and [place] Number:  Chiropodist and Address:  [facility], [address], [place], [place] [address]      Provider Number: [id number]  Attending Physician Name and Address:  Mosie Ford, MD  Relative Name and Phone Number:  [name] [phone]    Current Level of Care: Hospital Recommended Level of Care: Family Care Home Prior Approval Number:    Date Approved/Denied:   PASRR Number:    Discharge Plan: Other (Comment) (return to Group Home)    Current Diagnoses: Patient Active Problem List   Diagnosis Date Noted   Iron deficiency anemia 07/03/2024   Gram-negative bacteremia 06/30/2024   Sepsis due to Gram-negative organism with acute renal failure (HCC) 06/27/2024   Hyponatremia 06/27/2024   Acute on chronic anemia 06/27/2024   Dyslipidemia 06/27/2024   Peripheral neuropathy 06/27/2024   GERD without esophagitis 06/27/2024   Bacteremia 02/18/2024   Complicated UTI (urinary tract infection) 02/17/2024   Chronic indwelling Foley catheter 01/03/2024   Hypomagnesemia 12/30/2023   Depression with anxiety 12/30/2023   Altered mental status 11/12/2023   Chronic kidney disease, stage 3a (HCC) 10/21/2023   Myocardial injury 10/21/2023   CKD stage 3a, GFR 45-59 ml/min (HCC) - baseline scr 1.3 10/01/2023   Hepatic encephalopathy (HCC) 09/30/2023   Liver cirrhosis (HCC) 09/30/2023   Essential hypertension 09/30/2023   Diabetes mellitus type 2, noninsulin dependent (HCC) 09/30/2023   CAD (coronary artery disease) 09/30/2023   Hyperlipidemia 09/30/2023   Hepatic encephalopathy (HCC) 08/01/2023   Hyperammonemia 07/30/2023   Cervical spinal stenosis 07/01/2023   Cervical myelopathy (HCC) 06/21/2023   Cervical stenosis of spine 06/21/2023   Cervical  radiculopathy 06/20/2023   E coli bacteremia 06/17/2023   UTI (urinary tract infection) 06/17/2023   Left leg cellulitis 03/23/2023   Leukocytosis 03/23/2023   Left leg pain 03/23/2023   Suicidal ideation 01/14/2023   Acute pain 01/14/2023   Adrenal insufficiency 12/26/2022   Orthostatic hypotension 12/24/2022   Rib pain on right side 12/24/2022   Hepatitis B infection 12/24/2022   Myocardial ischemia 12/24/2022   Acute appendicitis 11/05/2022   Pancytopenia (HCC) 11/05/2022   Sinus bradycardia 11/05/2022   Acute metabolic encephalopathy 09/22/2022   Liver lesion 09/22/2022   Obesity (BMI 30-39.9) 09/22/2022   Acute hepatic encephalopathy (HCC) 09/22/2022   MDD (major depressive disorder), recurrent severe, 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   Major depressive disorder, 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 psychotic features (HCC) 09/15/2022   Protein-calorie malnutrition, severe 08/31/2022   Hypotension 08/31/2022   Weakness    Hx of transient ischemic attack (TIA) 04/28/2022   Obesity    Chronic diastolic CHF (congestive heart failure) (HCC)    Depression    Septic shock (HCC) 03/28/2022   Bacteremia due to Klebsiella pneumoniae 03/28/2022   Hypoglycemia 03/28/2022   Syncope 03/24/2022   Hypokalemia 03/24/2022   Fever 03/24/2022   COPD (chronic obstructive pulmonary disease) (HCC)    Elevated troponin    Abnormal LFTs    History of hepatitis C    AKI (acute kidney injury)    Pure hypercholesterolemia    Obesity, Class III, BMI 40-49.9 (morbid obesity) (HCC) 04/18/2020   Thrombocytopenia 04/17/2020   Leukopenia 04/17/2020   HTN (hypertension) 04/17/2018   Uncontrolled type 2 diabetes mellitus with hyperglycemia, without long-term current use of insulin  (HCC) 04/17/2018  Lymphedema 04/17/2018   Normocytic anemia 04/07/2018   Chest pain 03/20/2018   Unsheltered homelessness 12/16/2017    Orientation RESPIRATION BLADDER Height & Weight     Self, Place, Situation  Normal  Incontinent Weight: 94 kg Height:  5' 6 (167.6 cm)  BEHAVIORAL SYMPTOMS/MOOD NEUROLOGICAL BOWEL NUTRITION STATUS      Continent Diet (regular)  AMBULATORY STATUS COMMUNICATION OF NEEDS Skin   Limited Assist Verbally Normal                       Personal Care Assistance Level of Assistance  Bathing Bathing Assistance: Limited assistance         Functional Limitations Info  Sight Sight Info: Impaired (wears glasses)        SPECIAL CARE FACTORS FREQUENCY                       Contractures Contractures Info: Not present    Additional Factors Info  Code Status, Allergies Code Status Info: DNR limited Allergies Info: Bee Venom, Codeine , Rsv Mrna Pre-f Virus Vaccine, Meloxicam, Novocain (Procaine), Sulfa Antibiotics               Discharge Medications: Please see discharge summary for a list of discharge medications.  STOP taking these medications     furosemide  20 MG tablet Commonly known as: LASIX            TAKE these medications     acetaminophen  650 MG CR tablet Commonly known as: TYLENOL  Take 650 mg by mouth every 8 (eight) hours as needed for pain.    aspirin  81 MG chewable tablet Chew 81 mg by mouth daily.    atorvastatin  40 MG tablet Commonly known as: LIPITOR Take 40 mg by mouth daily.    benzocaine  10 % mucosal gel Commonly known as: ORAJEL Apply to the tip of the penis for penile pain up to four times daily prn for penile pain    citalopram  20 MG tablet Commonly known as: CELEXA  Take 20 mg by mouth daily.    feeding supplement Liqd Take 237 mLs by mouth 3 (three) times daily between meals.    gabapentin  300 MG capsule Commonly known as: NEURONTIN  Take 300 mg by mouth 2 (two) times daily.    lactulose  10 GM/15ML solution Commonly known as: CHRONULAC  Take 67.5 mLs (45 g total) by mouth 3 (three) times daily. Please titrate to have 2-3 soft bowel movements daily    methocarbamol  500 MG tablet Commonly known as:  ROBAXIN  Take 500 mg by mouth 4 (four) times daily.    multivitamin with minerals Tabs tablet Take 1 tablet by mouth daily.    nitroGLYCERIN  0.4 MG/SPRAY spray Commonly known as: NITROLINGUAL  Place 1 spray under the tongue every 5 (five) minutes x 3 doses as needed for chest pain.    nystatin powder Commonly known as: MYCOSTATIN/NYSTOP Apply 1 Application topically 3 (three) times daily for 20 days.    ondansetron  4 MG tablet Commonly known as: ZOFRAN  Take 1 tablet (4 mg total) by mouth every 6 (six) hours as needed for nausea.    ondansetron  8 MG disintegrating tablet Commonly known as: ZOFRAN -ODT Take 8 mg by mouth every 6 (six) hours as needed for nausea.    pantoprazole  40 MG tablet Commonly known as: PROTONIX  Take 40 mg by mouth daily.    phenol 1.4 % Liqd Commonly known as: CHLORASEPTIC Use as directed 1 spray in the mouth or throat as needed  for throat irritation / pain.    QUEtiapine  50 MG tablet Commonly known as: SEROQUEL  Take 50 mg by mouth at bedtime.    senna 8.6 MG Tabs tablet Commonly known as: SENOKOT Take 1 tablet (8.6 mg total) by mouth 2 (two) times daily as needed for mild constipation.    spironolactone  25 MG tablet Commonly known as: ALDACTONE  Take 0.5 tablets (12.5 mg total) by mouth daily. What changed: how much to take    Ventolin  HFA 108 (90 Base) MCG/ACT inhaler Generic drug: albuterol  Inhale 2 puffs into the lungs every 4 (four) hours as needed for wheezing.    albuterol  (2.5 MG/3ML) 0.083% nebulizer solution Commonly known as: PROVENTIL  Take 2.5 mg by nebulization every 4 (four) hours as needed for wheezing or shortness of breath.    Xifaxan  550 MG Tabs tablet Generic drug: rifaximin  Take 550 mg by mouth 2 (two) times daily.    Relevant Imaging Results:  Relevant Lab Results:   Additional Information SS# [id number]  Daved JONETTA Hamilton, RN

## 2024-07-03 NOTE — Progress Notes (Signed)
 Mobility Specialist - Progress Note    07/03/24 0953  Mobility  Activity Ambulated with assistance;Stood at bedside;Moved bed into chair position  Level of Assistance Contact guard assist, steadying assist  Assistive Device Front wheel walker  Distance Ambulated (ft) 25 ft  Range of Motion/Exercises All extremities  Activity Response Tolerated well  Mobility visit 1 Mobility  Mobility Specialist Start Time (ACUTE ONLY) 0840  Mobility Specialist Stop Time (ACUTE ONLY) 0901  Mobility Specialist Time Calculation (min) (ACUTE ONLY) 21 min   Pt was supine in bed with HOB elevated on RA and NT in the room upon entry. Pt agreed to mobility. Pt was having peri-care upon entry. Pt was and to get to the EOB with min A +2. After care pt was able to STS with 2 WW minA +2. Pt ambulated well. After activity pt returned to the room in bed with needs in reach.  Clem Rodes Mobility Specialist 07/03/24, 9:58 AM

## 2024-07-03 NOTE — TOC Transition Note (Signed)
 Transition of Care [facility]) - Discharge Note   Patient Details  Name: [name] MRN: [id number] Date of Birth: 05/27/1951  Transition of Care [facility]) CM/SW Contact:  Daved JONETTA Hamilton, RN Phone Number: 07/03/2024, 3:18 PM   Clinical Narrative:     Patient will DC to: [facility] Anticipated DC date: 07/03/2024 Family notified: [name] Transport by: Mariah will provide transport  Per MD patient ready for DC to [facility] group home. RN, patient, patient's family, and facility notified of DC. Discharge Summary and FL2 printed and added to DC packet. RN given number for report. DC packet on chart.  TOC signing off.   Final next level of care: Group Home Barriers to Discharge: Barriers Resolved   Patient Goals and CMS Choice            Discharge Placement                Patient to be transferred to facility by: Mariah will transport patient at discharge Name of family member notified: [name] Patient and family notified of of transfer: 07/03/24  Discharge Plan and Services Additional resources added to the After Visit Summary for                                       Social Drivers of Health (SDOH) Interventions SDOH Screenings   Food Insecurity: No Food Insecurity (06/28/2024)  Housing: Low Risk  (06/29/2024)  Transportation Needs: No Transportation Needs (06/28/2024)  Utilities: Not At Risk (06/28/2024)  Alcohol Screen: Low Risk  (09/24/2022)  Depression (PHQ2-9): High Risk (04/26/2022)  Financial Resource Strain: High Risk (04/27/2022)  Physical Activity: Sufficiently Active (04/16/2018)  Social Connections: Socially Isolated (06/28/2024)  Stress: Stress Concern Present (04/16/2018)  Tobacco Use: High Risk (06/29/2024)     Readmission Risk Interventions    11/29/2023    5:03 PM 11/13/2023    2:21 PM 11/12/2023    4:00 PM  Readmission Risk Prevention Plan  Transportation Screening Not Complete  Not Complete  Medication Review Oceanographer)  Not Complete Complete Not Complete  PCP or Specialist appointment within 3-5 days of discharge Not Complete Complete Complete  HRI or Home Care Consult Not Complete Complete   SW Recovery Care/Counseling Consult Not Complete Complete   Palliative Care Screening Not Applicable Complete   Skilled Nursing Facility Not Applicable Not Applicable

## 2024-07-11 NOTE — Progress Notes (Deleted)
 Cath Change/ Replacement  Patient is present today for a catheter change due to urinary retention.  ***ml of water was removed from the balloon, a ***FR foley cath was removed without difficulty.  Patient was cleaned and prepped in a sterile fashion with betadine and 2% lidocaine  jelly was instilled into the urethra. A *** FR foley cath was replaced into the bladder, {dnt complications:20057}. Urine return was noted ***ml and urine was *** in color. The balloon was filled with 10ml of sterile water. A *** bag was attached for drainage.  A night bag was also given to the patient and patient was given instruction on how to change from one bag to another. Patient was given proper instruction on catheter care.    Performed by: ***  Follow up: No follow-ups on file.

## 2024-07-13 ENCOUNTER — Ambulatory Visit: Admitting: Urology

## 2024-07-13 ENCOUNTER — Encounter: Payer: Self-pay | Admitting: Urology

## 2024-07-16 ENCOUNTER — Encounter: Admitting: Family

## 2024-07-18 ENCOUNTER — Emergency Department

## 2024-07-18 ENCOUNTER — Other Ambulatory Visit: Payer: Self-pay

## 2024-07-18 ENCOUNTER — Emergency Department: Admission: EM | Admit: 2024-07-18 | Discharge: 2024-07-18 | Disposition: A | Discharge status: Home / Self Care

## 2024-07-18 DIAGNOSIS — R519 Headache, unspecified: Secondary | ICD-10-CM | POA: Insufficient documentation

## 2024-07-18 DIAGNOSIS — W19XXXA Unspecified fall, initial encounter: Secondary | ICD-10-CM

## 2024-07-18 DIAGNOSIS — M545 Low back pain, unspecified: Secondary | ICD-10-CM | POA: Diagnosis not present

## 2024-07-18 DIAGNOSIS — Z960 Presence of urogenital implants: Secondary | ICD-10-CM | POA: Diagnosis not present

## 2024-07-18 DIAGNOSIS — Z978 Presence of other specified devices: Secondary | ICD-10-CM

## 2024-07-18 DIAGNOSIS — W06XXXA Fall from bed, initial encounter: Secondary | ICD-10-CM | POA: Insufficient documentation

## 2024-07-18 MED ORDER — ACETAMINOPHEN 325 MG PO TABS
650.0000 mg | ORAL_TABLET | Freq: Once | ORAL | Status: AC
  Administered 2024-07-18: 650 mg via ORAL
  Filled 2024-07-18: qty 2

## 2024-07-18 NOTE — ED Provider Notes (Signed)
 [facility] Provider Note    Event Date/Time   First MD Initiated Contact with Patient 07/18/24 [phone]     (approximate)   History   Fall   HPI  [name] is a 73 y.o. male who today with a fall.  He was laying in bed, he states that he remembers rolling out of bed and then he fell onto the ground.  He feels that he may have passed out afterwards.  Believes he was on the ground for maybe about 30 to 40 minutes before he was found and helped back up, complaining of some pain along the right side of his head.  He has a Foley catheter present for which she has chronic pain and he states that no new pain today along the area but he may have tugged on it.  He also does complain of some lower back pain which occurred after the fall primarily around his lumbar spine.  Denies any new weakness numbness tingling or any other symptoms.      Physical Exam   Triage Vital Signs: ED Triage Vitals  Encounter Vitals Group     BP      Girls Systolic BP Percentile      Girls Diastolic BP Percentile      Boys Systolic BP Percentile      Boys Diastolic BP Percentile      Pulse      Resp      Temp      Temp src      SpO2      Weight      Height      Head Circumference      Peak Flow      Pain Score      Pain Loc      Pain Education      Exclude from Growth Chart     Most recent vital signs: Vitals:   07/18/24 0801 07/18/24 0930  BP: 134/68 (!) 136/59  Pulse:  (!) 43  Resp:    Temp:    SpO2:  100%     General: Awake, no distress.  CV:  Good peripheral perfusion.  Resp:  Normal effort.  Abd:  No distention.  MSK:  He has some tenderness to palpation along the right side of his scalp, extraocular motions appear to be intact and does not have any visible deformity.  Also cervical and thoracic spine are without tenderness to palpation, but the lumbar spine has some minor discomfort to palpation.  He does having tenderness to palpation over his upper or  lower extremities.  Difficulty lifting his lower extremities up against the structure which appears to be near his baseline Other:     ED Results / Procedures / Treatments   Labs (all labs ordered are listed, but only abnormal results are displayed) Labs Reviewed - No data to display   EKG  Sinus rhythm with a rate of about 45, axis of about 45, intervals appear to be within normal limits, no obvious ischemia I appreciate this EKG   RADIOLOGY   PROCEDURES:  Critical Care performed: No  Procedures   MEDICATIONS ORDERED IN ED: Medications  acetaminophen  (TYLENOL ) tablet 650 mg (650 mg Oral Given 07/18/24 0913)     IMPRESSION / MDM / ASSESSMENT AND PLAN / ED COURSE  I reviewed the triage vital signs and the nursing notes.  Patient's presentation is most consistent with acute complicated illness / injury requiring diagnostic workup.  73 year old male who presents today with concern of a fall out of his bed.  He appears well he does not appear to be in any acute distress.  He is bradycardic here but seems to be near his baseline and remaining vitals are reassuring.  Given the finding will obtain CT imaging of the head as well as a lumbar spine to assess for further injury.  He has chronic pain along his catheter site, but does not appear to be new pain.  Will follow-up imaging and determine safe disposition accordingly.  Feel limited benefit of lab work as complaint today is primarily from underlying trauma and he was only on the ground for a brief amount of time so I find rhabdomyolysis unlikely.  Also limited utility of urinalysis as he has a chronic Foley and likely deals with chronic infections, given that he does not have any new symptoms he will this would be of limited benefit.  Patient CT imaging is reassuring, I suspect the fall was likely secondary to him rolling out of bed.  He is feeling well at this time, is comfortable with discharge  home we will have discharge at this time.      FINAL CLINICAL IMPRESSION(S) / ED DIAGNOSES   Final diagnoses:  Fall, initial encounter  Chronic indwelling Foley catheter     Rx / DC Orders   ED Discharge Orders     None        Note:  This document was prepared using Dragon voice recognition software and may include unintentional dictation errors.   Drew Rossie HERO, MD 07/18/24 [phone]

## 2024-07-18 NOTE — Discharge Instructions (Signed)
 Due to concern of a fall.  At this time your imaging is reassuring, I would recommend returning back to your facility, and following up with your primary doctor.  If you have any worsening symptoms such as recurrent falls, weakness, uncontrolled pain, or any other symptoms you find concerning please return to the emergency department immediately for further medical management.

## 2024-07-18 NOTE — ED Notes (Signed)
 Called report to [facility] family home [phone]). Francis at the group home took report and stated she will come pick him up.

## 2024-07-18 NOTE — ED Triage Notes (Signed)
 Pt BIB [facility] for a unwitnessed fall from group home. Pt was in wheel chair when he fell forward hitting the right side of his head. Pt endorses LOC. Endorses back pain and cath pain. Pt states he thinks he yanked on his catheter tubing when he fell. No visible injury to R side of head, put endorses pain with palpation. Denies blood thinners.  Hx bradycardia. AXO    134/64 44 HR 99RA 91 CBG

## 2024-07-18 NOTE — ED Notes (Signed)
 Pt to CT

## 2024-07-25 ENCOUNTER — Emergency Department
Admission: EM | Admit: 2024-07-25 | Discharge: 2024-07-26 | Disposition: A | Discharge status: Home / Self Care | Attending: Emergency Medicine | Admitting: Emergency Medicine

## 2024-07-25 DIAGNOSIS — N3090 Cystitis, unspecified without hematuria: Secondary | ICD-10-CM | POA: Diagnosis not present

## 2024-07-25 DIAGNOSIS — I5030 Unspecified diastolic (congestive) heart failure: Secondary | ICD-10-CM | POA: Insufficient documentation

## 2024-07-25 DIAGNOSIS — E119 Type 2 diabetes mellitus without complications: Secondary | ICD-10-CM | POA: Insufficient documentation

## 2024-07-25 DIAGNOSIS — Z8616 Personal history of COVID-19: Secondary | ICD-10-CM | POA: Diagnosis not present

## 2024-07-25 DIAGNOSIS — T83098A Other mechanical complication of other indwelling urethral catheter, initial encounter: Secondary | ICD-10-CM | POA: Insufficient documentation

## 2024-07-25 DIAGNOSIS — I110 Hypertensive heart disease with heart failure: Secondary | ICD-10-CM | POA: Diagnosis not present

## 2024-07-25 DIAGNOSIS — Y732 Prosthetic and other implants, materials and accessory gastroenterology and urology devices associated with adverse incidents: Secondary | ICD-10-CM | POA: Diagnosis not present

## 2024-07-25 DIAGNOSIS — J4489 Other specified chronic obstructive pulmonary disease: Secondary | ICD-10-CM | POA: Insufficient documentation

## 2024-07-25 DIAGNOSIS — I2510 Atherosclerotic heart disease of native coronary artery without angina pectoris: Secondary | ICD-10-CM | POA: Insufficient documentation

## 2024-07-25 DIAGNOSIS — Z978 Presence of other specified devices: Secondary | ICD-10-CM

## 2024-07-25 LAB — URINALYSIS, W/ REFLEX TO CULTURE (INFECTION SUSPECTED)
Bilirubin Urine: NEGATIVE
Glucose, UA: NEGATIVE mg/dL
Ketones, ur: NEGATIVE mg/dL
Nitrite: NEGATIVE
Protein, ur: NEGATIVE mg/dL
Specific Gravity, Urine: 1.012 (ref 1.005–1.030)
WBC, UA: >50 WBC/hpf (ref 0–5)
pH: 7 (ref 5.0–8.0)

## 2024-07-25 MED ORDER — OXYCODONE-ACETAMINOPHEN 5-325 MG PO TABS: 1.0000 | ORAL_TABLET | Freq: Once | ORAL | Status: DC

## 2024-07-25 MED ORDER — BENZOCAINE 10 % MT GEL
Freq: Once | OROMUCOSAL | Status: DC
  Filled 2024-07-25: qty 9

## 2024-07-25 MED ORDER — SODIUM CHLORIDE 0.9 % IV SOLN
2.0000 g | Freq: Once | INTRAVENOUS | Status: AC
  Administered 2024-07-25: 2 g via INTRAVENOUS
  Filled 2024-07-25: qty 20

## 2024-07-25 MED ORDER — OXYCODONE HCL 5 MG/5ML PO SOLN
5.0000 mg | Freq: Once | ORAL | Status: AC
  Administered 2024-07-25: 5 mg via ORAL

## 2024-07-25 MED ORDER — GABAPENTIN 300 MG PO CAPS: 300.0000 mg | ORAL_CAPSULE | ORAL | Status: DC

## 2024-07-25 MED ORDER — CEFDINIR 300 MG PO CAPS: 300.0000 mg | ORAL_CAPSULE | Freq: Two times a day (BID) | ORAL | 0 refills | Status: DC

## 2024-07-25 NOTE — ED Provider Notes (Signed)
 [facility] Provider Note    Event Date/Time   First MD Initiated Contact with Patient 07/25/24 1915     (approximate)   History   Chief Complaint: Groin Injury   HPI  [name] is a 73 y.o. male with a history of heart failure, COPD, GERD, chronic Foley catheter use who comes ED complaining of penis pain occurring while putting on pants which snagged his Foley catheter.  Reports there was bleeding at the time.  Also notes that he has been having some dysuria for the past few days and is worried about getting a UTI.  No chest pain shortness of breath back pain or fever        Past Medical History:  Diagnosis Date   (HFpEF) heart failure with preserved ejection fraction (HCC)    a.) TTE 03/20/2018: EF 60-65%, no RWMAs, mild LAE, mild-mod MR, PASP 42; b.) TTE 03/31/2021: EF 55-60%, no RWMAs, mild LVH, mild LAE, AoV sclerosis without stenosis; c.) TTE 03/25/2022: EF 60-65%, no RWMAs, AoV sclerosis without stenosis; d.) TTE 12/24/2022: EF 60-65%, no RWMAs, mild LVH, mild LAE.   Anasarca    Anemia    Anxiety    Aortic atherosclerosis    Arthritis    Asthma    Bacteremia due to Escherichia coli 05/2023   Bacteremia due to Klebsiella pneumoniae 2023   Bilateral carotid artery disease    Bradycardia    CAD (coronary artery disease) 10/21/2006   a.) MV 10/21/2006: small reversible defect involving the apical  lateral wall c/w possible ischemia; b.) MV: 03/24/2018: no ischemia; c.) MV 10/04/2021: LAD calcifications   CAD (coronary artery disease)    Cervical radiculopathy    Chest pain    a.) MV 03/21/2018: no ischemia; b.) MV 10/04/2021: no ischemia   Chronic back pain    Chronic common bile duct dilatation    Cirrhosis (HCC)    a.) (+) hepatic lesion on CT; likely hepatocellular carcinoma; b.) CT evidence of associated portal hypertension; c.) (+) abdominopelvic anasarca   COPD (chronic obstructive pulmonary disease) (HCC)    DDD (degenerative  disc disease), lumbar    Depression    Frequent falls    GERD (gastroesophageal reflux disease)    HBV (hepatitis B virus) infection    HCV (hepatitis C virus)    Hernia of anterior abdominal wall    History of 2019 novel coronavirus disease (COVID-19) 04/16/2020   Homelessness    a.) limited on placement as patient is listed on [place] sex offender listing for exploitation of a minor (possession of child pornography); incarcerated 12 years (released 11/2017)   Hyperlipidemia    Hypertension    Infestation by bed bug 02/2022   Malingering    Migraine    Nephrolithiasis    Obesity    Portal hypertension (HCC)    PTSD (post-traumatic stress disorder)    a.) brief training in the [employer] at age 86; someone was killed in front of him   Pulmonary HTN (HCC)    a.) multiple CT scans desmontrating dilated PA c/w pHTN; b.) TTE 03/20/2018: PASP 42 mmHg   Sleep apnea    a.) no nocturnal PAP therapy   Sleep apnea    Stenosis of cervical spine with myelopathy (HCC)    Suicidal ideations    T2DM (type 2 diabetes mellitus) (HCC)    Thrombocytopenia    a.) related to hepatic cirrhosis; spleen normal in size   TIA (transient ischemic attack)  TIA (transient ischemic attack) 09/30/2023    Current Outpatient Rx   Order #: [id number] Class: Normal   Order #: [id number] Class: Historical Med   Order #: [id number] Class: Historical Med   Order #: [id number] Class: Historical Med   Order #: [id number] Class: Historical Med   Order #: [id number] Class: Print   Order #: [id number] Class: Historical Med   Order #: [id number] Class: Normal   Order #: [id number] Class: Historical Med   Order #: [id number] Class: Normal   Order #: [id number] Class: Historical Med   Order #: [id number] Class: Historical Med   Order #: [id number] Class: Normal   Order #: [id number] Class: Normal   Order #: [id number] Class: Historical Med   Order #: [id number] Class: Historical Med   Order #: [id number] Class: Historical Med   Order #: [id number] Class:  Historical Med   Order #: [id number] Class: Normal   Order #: [id number] Class: Normal   Order #: [id number] Class: Historical Med   Order #: [id number] Class: Historical Med    Past Surgical History:  Procedure Laterality Date   ANTERIOR CERVICAL DECOMP/DISCECTOMY FUSION N/A 07/01/2023   Procedure: ANTERIOR CERVICAL DISCECTOMY AND FUSION (FORGE);  Surgeon: Clois Fret, MD;  Location: [facility] ORS;  Service: Neurosurgery;  Laterality: N/A;  Keep as last case of the day per Dr Clois   ANTERIOR CERVICAL DISCECTOMY     APPENDECTOMY     CARDIAC CATHETERIZATION     CHOLECYSTECTOMY     COLONOSCOPY N/A 04/01/2024   Procedure: COLONOSCOPY;  Surgeon: Toledo, Ladell POUR, MD;  Location: [facility];  Service: Gastroenterology;  Laterality: N/A;   ESOPHAGOGASTRODUODENOSCOPY N/A 04/01/2024   Procedure: EGD (ESOPHAGOGASTRODUODENOSCOPY);  Surgeon: Toledo, Ladell POUR, MD;  Location: [facility];  Service: Gastroenterology;  Laterality: N/A;   EYE SURGERY     INNER EAR SURGERY     IR CYSTOSTOMY TUBE PLACEMENT/BLADDER ASPIRATION  04/13/2024   NOSE SURGERY     POLYPECTOMY  04/01/2024   Procedure: POLYPECTOMY, INTESTINE;  Surgeon: Aundria, Ladell POUR, MD;  Location: [facility];  Service: Gastroenterology;;   XI ROBOTIC LAPAROSCOPIC ASSISTED APPENDECTOMY N/A 11/05/2022   Procedure: XI ROBOTIC LAPAROSCOPIC ASSISTED APPENDECTOMY;  Surgeon: Rodolph Romano, MD;  Location: [facility];  Service: General;  Laterality: N/A;    Physical Exam   Triage Vital Signs: ED Triage Vitals  Encounter Vitals Group     BP 07/25/24 1922 (!) 142/68     Girls Systolic BP Percentile --      Girls Diastolic BP Percentile --      Boys Systolic BP Percentile --      Boys Diastolic BP Percentile --      Pulse Rate 07/25/24 1922 (!) 50     Resp 07/25/24 1922 17     Temp 07/25/24 1922 98.4 F (36.9 C)     Temp Source 07/25/24 1922 Oral     SpO2 07/25/24 1922 100 %     Weight 07/25/24 1931 212 lb (96.2 kg)     Height  07/25/24 1931 5' 6 (1.676 m)     Head Circumference --      Peak Flow --      Pain Score 07/25/24 1922 9     Pain Loc --      Pain Education --      Exclude from Growth Chart --     Most recent vital signs: Vitals:   07/25/24 1922  BP: (!) 142/68  Pulse: (!) 50  Resp: 17  Temp: 98.4 F (36.9 C)  SpO2: 100%    General: Awake, no distress.  CV:  Good peripheral perfusion.  Regular rate rhythm Resp:  Normal effort.  Abd:  No distention.  Soft with suprapubic tenderness Other:  Glans penis is split due to chronic Foley catheter pressure.  No active bleeding, wound appears chronic   ED Results / Procedures / Treatments   Labs (all labs ordered are listed, but only abnormal results are displayed) Labs Reviewed  URINALYSIS, W/ REFLEX TO CULTURE (INFECTION SUSPECTED) - Abnormal; Notable for the following components:      Result Value   Color, Urine YELLOW (*)    APPearance HAZY (*)    Hgb urine dipstick SMALL (*)    Leukocytes,Ua LARGE (*)    Bacteria, UA RARE (*)    All other components within normal limits  URINE CULTURE     EKG    RADIOLOGY    PROCEDURES:  Procedures   MEDICATIONS ORDERED IN ED: Medications  benzocaine  (ORAJEL) 10 % mucosal gel (has no administration in time range)  oxyCODONE  (ROXICODONE ) 5 MG/5ML solution 5 mg (has no administration in time range)  cefTRIAXone  (ROCEPHIN ) 2 g in sodium chloride  0.9 % 100 mL IVPB (2 g Intravenous New Bag/Given 07/25/24 2028)     IMPRESSION / MDM / ASSESSMENT AND PLAN / ED COURSE  I reviewed the triage vital signs and the nursing notes.  DDx: UTI, soft tissue injury from chronic Foley use  Patient's presentation is most consistent with acute presentation with potential threat to life or bodily function.  Patient presents with penile pain, exam suspicious for UTI.  Will check urinalysis.  Doubt pyelonephritis, obstructing stone, or other intra-abdominal  pathology.   ----------------------------------------- 9:10 PM on 07/25/2024 ----------------------------------------- UA suggestive of UTI.  Urine culture pending.  Rocephin  given,.  Stable for outpatient follow-up      FINAL CLINICAL IMPRESSION(S) / ED DIAGNOSES   Final diagnoses:  Cystitis  Chronic indwelling Foley catheter     Rx / DC Orders   ED Discharge Orders          Ordered    cefdinir (OMNICEF) 300 MG capsule  2 times daily        07/25/24 2041             Note:  This document was prepared using Dragon voice recognition software and may include unintentional dictation errors.   Viviann Pastor, MD 07/25/24 2110

## 2024-07-25 NOTE — ED Triage Notes (Signed)
 Pt here via EMS from group home for foley cath complication (splitting) that is causing peni pain. Pt notes UTI symptoms (dysuria). Denies N/V/D/fever. Pt pleasant and well appearing. Significant past medical hx. No meds PTA.

## 2024-07-25 NOTE — Discharge Instructions (Signed)
 Follow up with PCP as soon as possible. Repeat CBC and CMP.  Follow creatinine to ensure complete resolution of AKI.  Monitor platelet count, thrombocytopenia evaluation per PCP call interventional radiology for rescheduling of missed placement of suprapubic catheter appointment

## 2024-07-27 ENCOUNTER — Telehealth: Payer: Self-pay | Admitting: Family

## 2024-07-27 NOTE — Telephone Encounter (Signed)
 Called to confirm/remind patient of their appointment at the [facility] on 07/28/24.   Appointment:   [x] Confirmed  [] Left mess   [] No answer/No voice mail  [] VM Full/unable to leave message  [] Phone not in service  Patient reminded to bring all medications and/or complete list.  Confirmed patient has transportation. Gave directions, instructed to utilize valet parking.

## 2024-07-27 NOTE — Progress Notes (Unsigned)
 [facility] Note    PCP: [facility]  Primary Cardiologist: Perla Lye, MD   Chief Complaint: fatigue   HPI:  [name] is a 73 y/o male with a history of T2DM, HTN, COPD, migraines, hepatitis C, w/ likely underlying HCC, cirrhosis, TIA, depression, anxiety obstructive sleep apnea, cervical stenosis status post C3-6 ACDF, hyperlipidemia, CKD, chronic foley and chronic heart failure.  Echo 04/01/21: EF of 55-60% along with mild LVH/ LAE. Echo 03/25/22: EF 60-65% Echo 12/24/22: EF 60-65% along with mild LVH, mild LAE.    Admitted 06/17/23 due to dysuria, was found to have E. coli bacteremia. Having some falls, patient having abdominal pain and weight loss. Abd/ pelvic CT stable. MRI of cervical spine shows C4-5 severe spinal stenosis with cord compression. Neurosurgery consulted. Antibiotics provided.  Admitted 07/01/23 due to cervical myelopathy and cervical stenosis status post C3-6 ACDF. He was admitted overnight for pain control, therapy evaluation, and drain output monitoring. His postoperative course was complicated by worsening thrombocytopenia which improved back to his baseline with 1 unit of platelets. His drain output reduced to an acceptable level and was removed on the afternoon of postop day 1 and discharged the following day.   Admitted 07/29/23 due to AMS and a fall. Had not been taking his lactulose  due to diarrhea & generalized weakness. CXR normal. Head CT normal. Palliative care consulted due to multiple comorbidities. PT consulted. Mental status improved and discharged to group home. 2 December ED visits due to weakness/ tremors.   Admitted 10/21/23 with acute onset of AMS thought to be related to hepatic encephalopathy. Ammonia 185 -> 25 with improvement of mental status. Daily oral protein recommended. Lactulose  to be titrated for 2-3 soft stools daily. Losartan  & spironolactone  resumed. EKG while admitted showed junctional  bradycardia.  Since being seen in [facility] 03/25, he has been in the ED twice and admitted 6 times. Most recent admission was 01/11/24 with AMS due to hepatic encephalopathy after not having BM for 2 days. Ammonia level was 82=>90. Provider noted extensive education to patient and group home caregiver about titrating lactulose  to obtain 2-3 bowel movements daily.     Admitted 02/17/24 after falling out of bed at his care home. Developed fever of 103.3 in the ED. UA + large leukocytes. Repeat lactic acid rose to 9.0. Blood cultures obtained. Given IV antibiotics and IVF. Stay was complicated by AKI which resulted in diuretics being held with improved of kidney function.   Since last [facility] visit 08/25, he's had 2 admissions and 7 ED visits mostly due to urology complaints. Last admission 06/26/24 was due to being septic secondary to bacteremia and UTI. Echo 06/30/24: EF 55-60%, mild LVH, normal RV.   He presents today for a HF follow-up visit with a chief complaint of minimal fatigue. Has occasional chest pain, resolves with 1 spray of NTG. Continues to wear chronic foley catheter. Denies shortness of breath, palpitations, dizziness, edema. Reports a good appetite and reports sleeping well. Has been eating well and says that he can eat everything and anything.    ROS: All systems negative except as listed in HPI, PMH and Problem List.  SH:  Social History   Socioeconomic History   Marital status: Single    Spouse name: Not on file   Number of children: 2   Years of education: 12   Highest education level: 12th grade  Occupational History   Occupation: retired  Tobacco Use   Smoking status: Some Days  Types: Cigarettes   Smokeless tobacco: Never   Tobacco comments:    2 times/month per patient   Vaping Use   Vaping status: Not on file  Substance and Sexual Activity   Alcohol use: Not Currently    Comment: Unknown   Drug use: Not Currently   Sexual activity: Not Currently    Birth  control/protection: None  Other Topics Concern   Not on file  Social History Narrative   ** Merged History Encounter **       Lives in [facility] care home   Social Drivers of Health   Financial Resource Strain: High Risk (04/27/2022)   Overall Financial Resource Strain (CARDIA)    Difficulty of Paying Living Expenses: Very hard  Food Insecurity: No Food Insecurity (06/28/2024)   Hunger Vital Sign    Worried About Running Out of Food in the Last Year: Never true    Ran Out of Food in the Last Year: Never true  Transportation Needs: No Transportation Needs (06/28/2024)   PRAPARE - Administrator, Civil Service (Medical): No    Lack of Transportation (Non-Medical): No  Physical Activity: Sufficiently Active (04/16/2018)   Exercise Vital Sign    Days of Exercise per Week: 7 days    Minutes of Exercise per Session: 30 min  Stress: Stress Concern Present (04/16/2018)   [employer] of Occupational Health - Occupational Stress Questionnaire    Feeling of Stress : Very much  Social Connections: Socially Isolated (06/28/2024)   Social Connection and Isolation Panel    Frequency of Communication with Friends and Family: More than three times a week    Frequency of Social Gatherings with Friends and Family: More than three times a week    Attends Religious Services: Never    Database Administrator or Organizations: No    Attends Banker Meetings: Never    Marital Status: Widowed  Intimate Partner Violence: Not At Risk (06/29/2024)   [name], Afraid, [name], and Kick questionnaire    Fear of Current or Ex-Partner: No    Emotionally Abused: No    Physically Abused: No    Sexually Abused: No    FH:  Family History  Problem Relation Age of Onset   Hypertension Mother    Heart disease Mother    Heart failure Maternal Grandmother     Past Medical History:  Diagnosis Date   (HFpEF) heart failure with preserved ejection fraction (HCC)    a.) TTE 03/20/2018:  EF 60-65%, no RWMAs, mild LAE, mild-mod MR, PASP 42; b.) TTE 03/31/2021: EF 55-60%, no RWMAs, mild LVH, mild LAE, AoV sclerosis without stenosis; c.) TTE 03/25/2022: EF 60-65%, no RWMAs, AoV sclerosis without stenosis; d.) TTE 12/24/2022: EF 60-65%, no RWMAs, mild LVH, mild LAE.   Anasarca    Anemia    Anxiety    Aortic atherosclerosis    Arthritis    Asthma    Bacteremia due to Escherichia coli 05/2023   Bacteremia due to Klebsiella pneumoniae 2023   Bilateral carotid artery disease    Bradycardia    CAD (coronary artery disease) 10/21/2006   a.) MV 10/21/2006: small reversible defect involving the apical  lateral wall c/w possible ischemia; b.) MV: 03/24/2018: no ischemia; c.) MV 10/04/2021: LAD calcifications   CAD (coronary artery disease)    Cervical radiculopathy    Chest pain    a.) MV 03/21/2018: no ischemia; b.) MV 10/04/2021: no ischemia   Chronic back pain    Chronic  common bile duct dilatation    Cirrhosis (HCC)    a.) (+) hepatic lesion on CT; likely hepatocellular carcinoma; b.) CT evidence of associated portal hypertension; c.) (+) abdominopelvic anasarca   COPD (chronic obstructive pulmonary disease) (HCC)    DDD (degenerative disc disease), lumbar    Depression    Frequent falls    GERD (gastroesophageal reflux disease)    HBV (hepatitis B virus) infection    HCV (hepatitis C virus)    Hernia of anterior abdominal wall    History of 2019 novel coronavirus disease (COVID-19) 04/16/2020   Homelessness    a.) limited on placement as patient is listed on [place] sex offender listing for exploitation of a minor (possession of child pornography); incarcerated 12 years (released 11/2017)   Hyperlipidemia    Hypertension    Infestation by bed bug 02/2022   Malingering    Migraine    Nephrolithiasis    Obesity    Portal hypertension (HCC)    PTSD (post-traumatic stress disorder)    a.) brief training in the [employer] at age 65; someone was killed in front of him   Pulmonary  HTN (HCC)    a.) multiple CT scans desmontrating dilated PA c/w pHTN; b.) TTE 03/20/2018: PASP 42 mmHg   Sleep apnea    a.) no nocturnal PAP therapy   Sleep apnea    Stenosis of cervical spine with myelopathy (HCC)    Suicidal ideations    T2DM (type 2 diabetes mellitus) (HCC)    Thrombocytopenia    a.) related to hepatic cirrhosis; spleen normal in size   TIA (transient ischemic attack)    TIA (transient ischemic attack) 09/30/2023    Current Outpatient Medications  Medication Sig Dispense Refill   acetaminophen  (TYLENOL ) 650 MG CR tablet Take 650 mg by mouth every 8 (eight) hours as needed for pain.     albuterol  (PROVENTIL ) (2.5 MG/3ML) 0.083% nebulizer solution Take 2.5 mg by nebulization every 4 (four) hours as needed for wheezing or shortness of breath.     aspirin  81 MG chewable tablet Chew 81 mg by mouth daily.     atorvastatin  (LIPITOR) 40 MG tablet Take 40 mg by mouth daily.     benzocaine  (ORAJEL) 10 % mucosal gel Apply to the tip of the penis for penile pain up to four times daily prn for penile pain 5.3 g 0   cefdinir (OMNICEF) 300 MG capsule Take 1 capsule (300 mg total) by mouth 2 (two) times daily. 14 capsule 0   citalopram  (CELEXA ) 20 MG tablet Take 20 mg by mouth daily.     feeding supplement (ENSURE ENLIVE / ENSURE PLUS) LIQD Take 237 mLs by mouth 3 (three) times daily between meals. 237 mL 12   gabapentin  (NEURONTIN ) 300 MG capsule Take 300 mg by mouth 2 (two) times daily.     lactulose  (CHRONULAC ) 10 GM/15ML solution Take 67.5 mLs (45 g total) by mouth 3 (three) times daily. Please titrate to have 2-3 soft bowel movements daily 946 mL 10   methocarbamol  (ROBAXIN ) 500 MG tablet Take 500 mg by mouth 4 (four) times daily.     Multiple Vitamin (MULTIVITAMIN WITH MINERALS) TABS tablet Take 1 tablet by mouth daily.     nitroGLYCERIN  (NITROLINGUAL ) 0.4 MG/SPRAY spray Place 1 spray under the tongue every 5 (five) minutes x 3 doses as needed for chest pain. (Patient not  taking: Reported on 06/27/2024) 12 g 5   ondansetron  (ZOFRAN ) 4 MG tablet Take 1 tablet (4  mg total) by mouth every 6 (six) hours as needed for nausea. 20 tablet 0   ondansetron  (ZOFRAN -ODT) 8 MG disintegrating tablet Take 8 mg by mouth every 6 (six) hours as needed for nausea.     pantoprazole  (PROTONIX ) 40 MG tablet Take 40 mg by mouth daily.     phenol (CHLORASEPTIC) 1.4 % LIQD Use as directed 1 spray in the mouth or throat as needed for throat irritation / pain.     QUEtiapine  (SEROQUEL ) 50 MG tablet Take 50 mg by mouth at bedtime.     senna (SENOKOT) 8.6 MG TABS tablet Take 1 tablet (8.6 mg total) by mouth 2 (two) times daily as needed for mild constipation. 60 tablet 0   spironolactone  (ALDACTONE ) 25 MG tablet Take 0.5 tablets (12.5 mg total) by mouth daily. 30 tablet 0   VENTOLIN  HFA 108 (90 Base) MCG/ACT inhaler Inhale 2 puffs into the lungs every 4 (four) hours as needed for wheezing.     XIFAXAN  550 MG TABS tablet Take 550 mg by mouth 2 (two) times daily.     No current facility-administered medications for this visit.   Vitals:   07/28/24 1231  BP: 101/64  Pulse: (!) 52  SpO2: 100%  Weight: 215 lb (97.5 kg)   Wt Readings from Last 3 Encounters:  07/28/24 215 lb (97.5 kg)  07/25/24 212 lb (96.2 kg)  07/18/24 207 lb 3.7 oz (94 kg)   Lab Results  Component Value Date   CREATININE 1.55 (H) 07/28/2024   CREATININE 1.47 (H) 07/03/2024   CREATININE 1.51 (H) 07/02/2024   PHYSICAL EXAM:  General: Well appearing male. Foley catheter in place.   Cor: No JVD. Regular rhythm, bradycardic.  Lungs: clear Abdomen: soft, nontender, nondistended. Extremities: no edema Neuro:. Affect pleasant   ECG: not done   ASSESSMENT & PLAN:  1: NICM with preserved ejection fraction- - likely due to HTN/ liver disease - NYHA class II - euvolemic - weight up 15 pounds from last visit here 3 months ago. Patient says that he's eating anything and everything.  - Echo 03/20/18: EF of 60-65%  along with mild/moderate MR and a mildly elevated PA pressure of 42 mm Hg - Echo 04/01/21: EF of 55-60% along with mild LVH/ LAE. - Echo 03/25/22: EF 60-65% - Echo 12/24/22: EF 60-65% along with mild LVH, mild LAE.   - Echo 06/30/24: EF 55-60%, mild LVH, normal RV.  - continue furosemide  40mg  daily - continue spironolactone  25mg  daily - had telemedicine visit with cardiology Florestine) 02/22 - BNP 12/30/23 reviewed and was 381.2  2: HTN- - BP 101/64 - sees PCP at [facility] [place])  - BMP 07/03/24 reviewed: sodium 135, potassium 4.5, creatinine 1.47 and GFR 50 - BMET today  3: DM- - A1c 09/30/23 reviewed and was 4.9%  4: Liver cirrhosis- - likely secondary hepatitis C w/ positive viral load in 2019 - acute viral panel 02/24 demonstrated positive for hepatitis B antigen.  - continue lactulose  TID - saw GI Emil) 07/25 - colonoscopy done 04/01/24  5: Urinary retention- - has foley catheter in place but appears to be leaking - follows with urology   Return in 6 months, sooner if needed.   I spent 34 minutes reviewing records, interviewing/ examing patient and managing plan/ orders.   Ellouise DELENA Class, FNP-C 07/27/24

## 2024-07-28 ENCOUNTER — Other Ambulatory Visit
Admission: RE | Admit: 2024-07-28 | Discharge: 2024-07-28 | Disposition: A | Source: Ambulatory Visit | Attending: Family | Admitting: Family

## 2024-07-28 ENCOUNTER — Ambulatory Visit: Payer: Self-pay | Admitting: Family

## 2024-07-28 ENCOUNTER — Encounter: Payer: Self-pay | Admitting: Family

## 2024-07-28 ENCOUNTER — Ambulatory Visit: Admitting: Family

## 2024-07-28 VITALS — BP 101/64 | HR 52 | Wt 215.0 lb

## 2024-07-28 DIAGNOSIS — I5032 Chronic diastolic (congestive) heart failure: Secondary | ICD-10-CM

## 2024-07-28 DIAGNOSIS — R188 Other ascites: Secondary | ICD-10-CM

## 2024-07-28 DIAGNOSIS — E119 Type 2 diabetes mellitus without complications: Secondary | ICD-10-CM

## 2024-07-28 DIAGNOSIS — R339 Retention of urine, unspecified: Secondary | ICD-10-CM

## 2024-07-28 DIAGNOSIS — K7460 Unspecified cirrhosis of liver: Secondary | ICD-10-CM

## 2024-07-28 DIAGNOSIS — I10 Essential (primary) hypertension: Secondary | ICD-10-CM

## 2024-07-28 LAB — BASIC METABOLIC PANEL WITH GFR
Anion gap: 7 (ref 5–15)
BUN: 22 mg/dL (ref 8–23)
CO2: 26 mmol/L (ref 22–32)
Calcium: 9.1 mg/dL (ref 8.9–10.3)
Chloride: 104 mmol/L (ref 98–111)
Creatinine, Ser: 1.55 mg/dL — ABNORMAL HIGH (ref 0.61–1.24)
GFR, Estimated: 46 mL/min — ABNORMAL LOW (ref >60)
Glucose, Bld: 120 mg/dL — ABNORMAL HIGH (ref 70–99)
Potassium: 4.4 mmol/L (ref 3.5–5.1)
Sodium: 137 mmol/L (ref 135–145)

## 2024-07-28 NOTE — Patient Instructions (Signed)
 Medication Changes:  No medication changes today!  Lab Work:  Go over to the [facility]. Go pass the gift shop and have your blood work completed.  We will only call you if the results are abnormal or if the provider would like to make medication changes.  No news is good news.   Follow-Up in: Please follow up with the [facility] in 6 months with Ellouise Class, FNP.   Thank you for choosing [facility].    At the [facility], you and your health needs are our priority. We have a designated team specialized in the treatment of Heart Failure. This Care Team includes your primary Heart Failure Specialized Cardiologist (physician), Advanced Practice Providers (APPs- Physician Assistants and Nurse Practitioners), and Pharmacist who all work together to provide you with the care you need, when you need it.   You may see any of the following providers on your designated Care Team at your next follow up:  Dr. Toribio Fuel Dr. Ezra Shuck Dr. Ria Commander Dr. Morene Brownie Ellouise Class, FNP Jaun Bash, RPH-CPP  Please be sure to bring in all your medications bottles to every appointment.   Need to Contact Us :  If you have any questions or concerns before your next appointment please send us  a message through [place] or call our office at [phone].    TO LEAVE A MESSAGE FOR THE NURSE SELECT OPTION 2, PLEASE LEAVE A MESSAGE INCLUDING: YOUR NAME DATE OF BIRTH CALL BACK NUMBER REASON FOR CALL**this is important as we prioritize the call backs  YOU WILL RECEIVE A CALL BACK THE SAME DAY AS LONG AS YOU CALL BEFORE 4:00 PM

## 2024-07-31 ENCOUNTER — Emergency Department

## 2024-07-31 ENCOUNTER — Inpatient Hospital Stay
Admission: EM | Admit: 2024-07-31 | Discharge: 2024-08-06 | DRG: 442 | Disposition: A | Discharge status: Home / Self Care

## 2024-07-31 ENCOUNTER — Other Ambulatory Visit: Payer: Self-pay

## 2024-07-31 DIAGNOSIS — K766 Portal hypertension: Secondary | ICD-10-CM | POA: Diagnosis present

## 2024-07-31 DIAGNOSIS — E2740 Unspecified adrenocortical insufficiency: Secondary | ICD-10-CM | POA: Diagnosis present

## 2024-07-31 DIAGNOSIS — J449 Chronic obstructive pulmonary disease, unspecified: Secondary | ICD-10-CM | POA: Diagnosis present

## 2024-07-31 DIAGNOSIS — Z8616 Personal history of COVID-19: Secondary | ICD-10-CM | POA: Diagnosis not present

## 2024-07-31 DIAGNOSIS — N1831 Chronic kidney disease, stage 3a: Secondary | ICD-10-CM | POA: Diagnosis present

## 2024-07-31 DIAGNOSIS — M4802 Spinal stenosis, cervical region: Secondary | ICD-10-CM | POA: Diagnosis present

## 2024-07-31 DIAGNOSIS — N390 Urinary tract infection, site not specified: Secondary | ICD-10-CM

## 2024-07-31 DIAGNOSIS — E785 Hyperlipidemia, unspecified: Secondary | ICD-10-CM | POA: Diagnosis present

## 2024-07-31 DIAGNOSIS — E669 Obesity, unspecified: Secondary | ICD-10-CM | POA: Diagnosis present

## 2024-07-31 DIAGNOSIS — D631 Anemia in chronic kidney disease: Secondary | ICD-10-CM | POA: Diagnosis present

## 2024-07-31 DIAGNOSIS — I428 Other cardiomyopathies: Secondary | ICD-10-CM | POA: Diagnosis present

## 2024-07-31 DIAGNOSIS — I130 Hypertensive heart and chronic kidney disease with heart failure and stage 1 through stage 4 chronic kidney disease, or unspecified chronic kidney disease: Secondary | ICD-10-CM | POA: Diagnosis present

## 2024-07-31 DIAGNOSIS — I272 Pulmonary hypertension, unspecified: Secondary | ICD-10-CM | POA: Diagnosis present

## 2024-07-31 DIAGNOSIS — K7682 Hepatic encephalopathy: Secondary | ICD-10-CM | POA: Diagnosis present

## 2024-07-31 DIAGNOSIS — Z8619 Personal history of other infectious and parasitic diseases: Secondary | ICD-10-CM | POA: Diagnosis present

## 2024-07-31 DIAGNOSIS — B1910 Unspecified viral hepatitis B without hepatic coma: Secondary | ICD-10-CM | POA: Diagnosis present

## 2024-07-31 DIAGNOSIS — Z7982 Long term (current) use of aspirin: Secondary | ICD-10-CM | POA: Diagnosis not present

## 2024-07-31 DIAGNOSIS — E1122 Type 2 diabetes mellitus with diabetic chronic kidney disease: Secondary | ICD-10-CM | POA: Diagnosis present

## 2024-07-31 DIAGNOSIS — I10 Essential (primary) hypertension: Secondary | ICD-10-CM | POA: Diagnosis present

## 2024-07-31 DIAGNOSIS — F329 Major depressive disorder, single episode, unspecified: Secondary | ICD-10-CM | POA: Diagnosis present

## 2024-07-31 DIAGNOSIS — F17210 Nicotine dependence, cigarettes, uncomplicated: Secondary | ICD-10-CM | POA: Diagnosis present

## 2024-07-31 DIAGNOSIS — I5032 Chronic diastolic (congestive) heart failure: Secondary | ICD-10-CM | POA: Diagnosis present

## 2024-07-31 DIAGNOSIS — I69391 Dysphagia following cerebral infarction: Secondary | ICD-10-CM | POA: Diagnosis not present

## 2024-07-31 DIAGNOSIS — K7460 Unspecified cirrhosis of liver: Secondary | ICD-10-CM | POA: Diagnosis present

## 2024-07-31 DIAGNOSIS — E1140 Type 2 diabetes mellitus with diabetic neuropathy, unspecified: Secondary | ICD-10-CM | POA: Diagnosis present

## 2024-07-31 DIAGNOSIS — E119 Type 2 diabetes mellitus without complications: Secondary | ICD-10-CM

## 2024-07-31 DIAGNOSIS — I700 Atherosclerosis of aorta: Secondary | ICD-10-CM | POA: Diagnosis present

## 2024-07-31 DIAGNOSIS — Z978 Presence of other specified devices: Secondary | ICD-10-CM

## 2024-07-31 DIAGNOSIS — E66811 Obesity, class 1: Secondary | ICD-10-CM | POA: Diagnosis present

## 2024-07-31 DIAGNOSIS — I2510 Atherosclerotic heart disease of native coronary artery without angina pectoris: Secondary | ICD-10-CM | POA: Diagnosis present

## 2024-07-31 DIAGNOSIS — R4182 Altered mental status, unspecified: Secondary | ICD-10-CM | POA: Diagnosis present

## 2024-07-31 LAB — CBC WITH DIFFERENTIAL/PLATELET
Abs Immature Granulocytes: 0.02 K/uL (ref 0.00–0.07)
Basophils Absolute: 0 K/uL (ref 0.0–0.1)
Basophils Relative: 1 %
Eosinophils Absolute: 0 K/uL (ref 0.0–0.5)
Eosinophils Relative: 1 %
HCT: 31.6 % — ABNORMAL LOW (ref 39.0–52.0)
Hemoglobin: 10.1 g/dL — ABNORMAL LOW (ref 13.0–17.0)
Immature Granulocytes: 1 %
Lymphocytes Relative: 14 %
Lymphs Abs: 0.5 K/uL — ABNORMAL LOW (ref 0.7–4.0)
MCH: 29.7 pg (ref 26.0–34.0)
MCHC: 32 g/dL (ref 30.0–36.0)
MCV: 92.9 fL (ref 80.0–100.0)
Monocytes Absolute: 0.4 K/uL (ref 0.1–1.0)
Monocytes Relative: 11 %
Neutro Abs: 2.9 K/uL (ref 1.7–7.7)
Neutrophils Relative %: 72 %
Platelets: 118 K/uL — ABNORMAL LOW (ref 150–400)
RBC: 3.4 MIL/uL — ABNORMAL LOW (ref 4.22–5.81)
RDW: 18.6 % — ABNORMAL HIGH (ref 11.5–15.5)
WBC: 3.9 K/uL — ABNORMAL LOW (ref 4.0–10.5)
nRBC: 0 % (ref 0.0–0.2)

## 2024-07-31 LAB — SUSCEPTIBILITY, AER + ANAEROB

## 2024-07-31 LAB — COMPREHENSIVE METABOLIC PANEL WITH GFR
ALT: 18 U/L (ref 0–44)
AST: 34 U/L (ref 15–41)
Albumin: 3 g/dL — ABNORMAL LOW (ref 3.5–5.0)
Alkaline Phosphatase: 79 U/L (ref 38–126)
Anion gap: 10 (ref 5–15)
BUN: 19 mg/dL (ref 8–23)
CO2: 21 mmol/L — ABNORMAL LOW (ref 22–32)
Calcium: 9.8 mg/dL (ref 8.9–10.3)
Chloride: 107 mmol/L (ref 98–111)
Creatinine, Ser: 1.4 mg/dL — ABNORMAL HIGH (ref 0.61–1.24)
GFR, Estimated: 52 mL/min — ABNORMAL LOW (ref >60)
Glucose, Bld: 131 mg/dL — ABNORMAL HIGH (ref 70–99)
Potassium: 4.5 mmol/L (ref 3.5–5.1)
Sodium: 138 mmol/L (ref 135–145)
Total Bilirubin: 1.3 mg/dL — ABNORMAL HIGH (ref 0.0–1.2)
Total Protein: 7.4 g/dL (ref 6.5–8.1)

## 2024-07-31 LAB — URINE CULTURE: Culture: >=100000 — AB

## 2024-07-31 LAB — URINE DRUG SCREEN
Amphetamines: NEGATIVE
Barbiturates: NEGATIVE
Benzodiazepines: NEGATIVE
Cocaine: NEGATIVE
Fentanyl: NEGATIVE
Methadone Scn, Ur: NEGATIVE
Opiates: NEGATIVE
Tetrahydrocannabinol: NEGATIVE

## 2024-07-31 LAB — URINALYSIS, W/ REFLEX TO CULTURE (INFECTION SUSPECTED)
Bilirubin Urine: NEGATIVE
Glucose, UA: NEGATIVE mg/dL
Ketones, ur: NEGATIVE mg/dL
Nitrite: NEGATIVE
Protein, ur: 30 mg/dL — AB
RBC / HPF: >50 RBC/hpf (ref 0–5)
Specific Gravity, Urine: 1.018 (ref 1.005–1.030)
Squamous Epithelial / HPF: 0 /HPF (ref 0–5)
WBC, UA: >50 WBC/hpf (ref 0–5)
pH: 7 (ref 5.0–8.0)

## 2024-07-31 LAB — BLOOD GAS, ARTERIAL
Acid-base deficit: 1.9 mmol/L (ref 0.0–2.0)
Bicarbonate: 24.3 mmol/L (ref 20.0–28.0)
FIO2: 35 %
MECHVT: 400 mL
Mechanical Rate: 16
O2 Saturation: 99.8 %
PEEP: 5 cmH2O
Patient temperature: 37
pCO2 arterial: 46 mmHg (ref 32–48)
pH, Arterial: 7.33 — ABNORMAL LOW (ref 7.35–7.45)
pO2, Arterial: 134 mmHg — ABNORMAL HIGH (ref 83–108)

## 2024-07-31 LAB — SUSCEPTIBILITY RESULT

## 2024-07-31 LAB — PROTIME-INR
INR: 1.6 — ABNORMAL HIGH (ref 0.8–1.2)
Prothrombin Time: 20.1 s — ABNORMAL HIGH (ref 11.4–15.2)

## 2024-07-31 LAB — LACTIC ACID, PLASMA
Lactic Acid, Venous: 3.8 mmol/L (ref 0.5–1.9)
Lactic Acid, Venous: 3.4 mmol/L (ref 0.5–1.9)

## 2024-07-31 LAB — MRSA NEXT GEN BY PCR, NASAL: MRSA by PCR Next Gen: NOT DETECTED

## 2024-07-31 LAB — AMMONIA: Ammonia: 258 umol/L — ABNORMAL HIGH (ref 9–35)

## 2024-07-31 LAB — CBG MONITORING, ED: Glucose-Capillary: 119 mg/dL — ABNORMAL HIGH (ref 70–99)

## 2024-07-31 MED ORDER — FAMOTIDINE 20 MG PO TABS
20.0000 mg | ORAL_TABLET | Freq: Two times a day (BID) | ORAL | Status: DC
  Administered 2024-07-31 – 2024-08-01 (×2): 20 mg
  Filled 2024-07-31 (×2): qty 1

## 2024-07-31 MED ORDER — LACTULOSE 10 GM/15ML PO SOLN
30.0000 g | Freq: Three times a day (TID) | ORAL | Status: DC
  Administered 2024-07-31 – 2024-08-01 (×3): 30 g
  Filled 2024-07-31 (×3): qty 60

## 2024-07-31 MED ORDER — ROCURONIUM BROMIDE 10 MG/ML (PF) SYRINGE
PREFILLED_SYRINGE | INTRAVENOUS | Status: AC
  Administered 2024-07-31: 100 mg via INTRAVENOUS
  Filled 2024-07-31: qty 10

## 2024-07-31 MED ORDER — LACTULOSE 10 GM/15ML PO SOLN
30.0000 g | Freq: Once | ORAL | Status: AC
  Administered 2024-07-31: 30 g
  Filled 2024-07-31: qty 60

## 2024-07-31 MED ORDER — SODIUM CHLORIDE 0.9 % IV SOLN
2.0000 g | INTRAVENOUS | Status: DC
  Administered 2024-07-31: 2 g via INTRAVENOUS
  Filled 2024-07-31 (×2): qty 20

## 2024-07-31 MED ORDER — SODIUM CHLORIDE 0.9 % IV BOLUS
1000.0000 mL | Freq: Once | INTRAVENOUS | Status: AC
  Administered 2024-07-31: 1000 mL via INTRAVENOUS

## 2024-07-31 MED ORDER — PROPOFOL 1000 MG/100ML IV EMUL
INTRAVENOUS | Status: AC
  Administered 2024-07-31: 5 ug/kg/min via INTRAVENOUS
  Filled 2024-07-31: qty 100

## 2024-07-31 MED ORDER — KETAMINE HCL 50 MG/5ML IJ SOSY
PREFILLED_SYRINGE | INTRAMUSCULAR | Status: AC
  Administered 2024-07-31: 100 mg via INTRAVENOUS
  Filled 2024-07-31: qty 10

## 2024-07-31 MED ORDER — PROPOFOL 1000 MG/100ML IV EMUL
0.0000 ug/kg/min | INTRAVENOUS | Status: DC
  Administered 2024-07-31 – 2024-08-01 (×3): 25 ug/kg/min via INTRAVENOUS
  Filled 2024-07-31 (×3): qty 100

## 2024-07-31 MED ORDER — ROCURONIUM BROMIDE 10 MG/ML (PF) SYRINGE: 100.0000 mg | PREFILLED_SYRINGE | Freq: Once | INTRAVENOUS | Status: AC

## 2024-07-31 MED ORDER — SODIUM CHLORIDE 0.9 % IV SOLN
2.0000 g | Freq: Once | INTRAVENOUS | Status: AC
  Administered 2024-07-31: 2 g via INTRAVENOUS
  Filled 2024-07-31: qty 12.5

## 2024-07-31 MED ORDER — HEPARIN SODIUM (PORCINE) 5000 UNIT/ML IJ SOLN
5000.0000 [IU] | Freq: Three times a day (TID) | INTRAMUSCULAR | Status: DC
  Administered 2024-07-31 – 2024-08-02 (×8): 5000 [IU] via SUBCUTANEOUS
  Filled 2024-07-31 (×8): qty 1

## 2024-07-31 MED ORDER — KETAMINE HCL 50 MG/5ML IJ SOSY: 100.0000 mg | PREFILLED_SYRINGE | Freq: Once | INTRAMUSCULAR | Status: AC

## 2024-07-31 MED ORDER — PANTOPRAZOLE SODIUM 40 MG IV SOLR: 40.0000 mg | Freq: Every day | INTRAVENOUS | Status: DC

## 2024-07-31 MED ORDER — ZINC SULFATE 220 (50 ZN) MG PO CAPS
220.0000 mg | ORAL_CAPSULE | Freq: Every day | ORAL | Status: DC
  Administered 2024-07-31 – 2024-08-01 (×2): 220 mg
  Filled 2024-07-31 (×2): qty 1

## 2024-07-31 MED ORDER — RIFAXIMIN 550 MG PO TABS
550.0000 mg | ORAL_TABLET | Freq: Two times a day (BID) | ORAL | Status: DC
  Administered 2024-07-31 – 2024-08-01 (×3): 550 mg
  Filled 2024-07-31 (×3): qty 1

## 2024-07-31 MED ORDER — CHLORHEXIDINE GLUCONATE 4 % EX SOLN: 6.0000 | Freq: Every day | CUTANEOUS | Status: DC

## 2024-07-31 NOTE — ED Notes (Signed)
 Lab called to collect blood work.

## 2024-07-31 NOTE — H&P (Signed)
 NAME:  [name], MRN:  [id number], DOB:  1951-01-14, LOS: 0 ADMISSION DATE:  07/31/2024, CONSULTATION DATE:  07/31/2024 REFERRING MD:  Dr. Levander, CHIEF COMPLAINT:  Altered Mental Status   Brief Pt Description / Synopsis:  73 year old male admitted with hepatic encephalopathy requiring intubation for airway protection, along with concern for UTI and enteritis.  History of Present Illness:  [name] is a 73 year old male with a past medical history significant for hepatic encephalopathy, COPD, CHF, hypertension, type 2 diabetes mellitus who presented to [facility] ED on 07/31/2024 for evaluation of altered mental status.  Patient is currently intubated and sedated unable to contribute to history, no family currently available, therefore history obtained from chart review.  It is reported that he was sent from his group home after being found unresponsive, with his last known well time occurring when he went to bed last night.  EMS reported some response to painful stimuli.  Of note he was recently admitted on 07/03/2024 after presenting with fever and lethargy.  He was found to be septic due to bacteremia from Klebsiella UTI in the setting of an indwelling Foley catheter along with acute hepatic encephalopathy that improved with lactulose  and rifaximin .   ED Course: Initial Vital Signs: Temperature 96.6 F rectally, pulse 66, respiratory rate 13, blood pressure 151/67, SpO2 100% on room air Significant Labs: WBC 3.9, hemoglobin 10.1, platelets 118, lactic acid 3.8, INR 1.6, PT 20.1, glucose 131, creatinine 1.4, BUN 19 Urinalysis consistent with UTI Imaging Chest X-ray>>IMPRESSION: Mild cardiomegaly. No pneumonia or pulmonary edema. CT Head wo contrast>>IMPRESSION: 1. No acute traumatic injury identified. 2. Stable and normal for age non-contrast CT appearance of the brain. CT Abdomen & Pelvis wo contrast>>IMPRESSION: 1. Fluid containing nondilated small and large bowel suggesting  acute enteritis/diarrhea. 2. Foley catheter with no adverse features. Bladder wall thickening and mild inflammation, but decreased from 04/14/2024. 3. Cirrhosis with mild splenomegaly. Chronic renal atrophy and nephrolithiasis. 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 Lung base atelectasis. Medications Administered: IV cefepime   Initially he was managing his secretions, however after returning from CT he had worsening mental status with snoring respirations requiring intubation for airway protection.  PCCM asked to admit for further workup and treatment.  Please see Significant Hospital Events section below for full detailed hospital course.   Pertinent  Medical History   Past Medical History:  Diagnosis Date   (HFpEF) heart failure with preserved ejection fraction (HCC)    a.) TTE 03/20/2018: EF 60-65%, no RWMAs, mild LAE, mild-mod MR, PASP 42; b.) TTE 03/31/2021: EF 55-60%, no RWMAs, mild LVH, mild LAE, AoV sclerosis without stenosis; c.) TTE 03/25/2022: EF 60-65%, no RWMAs, AoV sclerosis without stenosis; d.) TTE 12/24/2022: EF 60-65%, no RWMAs, mild LVH, mild LAE.   Anasarca    Anemia    Anxiety    Aortic atherosclerosis    Arthritis    Asthma    Bacteremia due to Escherichia coli 05/2023   Bacteremia due to Klebsiella pneumoniae 2023   Bilateral carotid artery disease    Bradycardia    CAD (coronary artery disease) 10/21/2006   a.) MV 10/21/2006: small reversible defect involving the apical  lateral wall c/w possible ischemia; b.) MV: 03/24/2018: no ischemia; c.) MV 10/04/2021: LAD calcifications   CAD (coronary artery disease)    Cervical radiculopathy    Chest pain    a.) MV 03/21/2018: no ischemia; b.) MV 10/04/2021: no ischemia   Chronic back pain    Chronic  common bile duct dilatation    Cirrhosis (HCC)    a.) (+) hepatic lesion on CT; likely hepatocellular carcinoma; b.) CT evidence of associated portal hypertension; c.) (+)  abdominopelvic anasarca   COPD (chronic obstructive pulmonary disease) (HCC)    DDD (degenerative disc disease), lumbar    Depression    Frequent falls    GERD (gastroesophageal reflux disease)    HBV (hepatitis B virus) infection    HCV (hepatitis C virus)    Hernia of anterior abdominal wall    History of 2019 novel coronavirus disease (COVID-19) 04/16/2020   Homelessness    a.) limited on placement as patient is listed on [place] sex offender listing for exploitation of a minor (possession of child pornography); incarcerated 12 years (released 11/2017)   Hyperlipidemia    Hypertension    Infestation by bed bug 02/2022   Malingering    Migraine    Nephrolithiasis    Obesity    Portal hypertension (HCC)    PTSD (post-traumatic stress disorder)    a.) brief training in [employer] at age 21; someone was killed in front of him   Pulmonary HTN (HCC)    a.) multiple CT scans desmontrating dilated PA c/w pHTN; b.) TTE 03/20/2018: PASP 42 mmHg   Sleep apnea    a.) no nocturnal PAP therapy   Sleep apnea    Stenosis of cervical spine with myelopathy (HCC)    Suicidal ideations    T2DM (type 2 diabetes mellitus) (HCC)    Thrombocytopenia    a.) related to hepatic cirrhosis; spleen normal in size   TIA (transient ischemic attack)    TIA (transient ischemic attack) 09/30/2023    Micro Data:  12/5: Blood cultures x 2>> 12/5: Urine>> 12/5: MRSA PCR negative  Antimicrobials:   Anti-infectives (From admission, onward)    Start     Dose/Rate Route Frequency Ordered Stop   07/31/24 2000  cefTRIAXone  (ROCEPHIN ) 2 g in sodium chloride  0.9 % 100 mL IVPB        2 g 200 mL/hr over 30 Minutes Intravenous Every 24 hours 07/31/24 1227     07/31/24 1400  rifaximin  (XIFAXAN ) tablet 550 mg        550 mg Per Tube 2 times daily 07/31/24 1227     07/31/24 0930  ceFEPIme  (MAXIPIME ) 2 g in sodium chloride  0.9 % 100 mL IVPB        2 g 200 mL/hr over 30 Minutes Intravenous  Once 07/31/24 0929 07/31/24  1025       Significant Hospital Events: Including procedures, antibiotic start and stop dates in addition to other pertinent events   12/5: Presented to ED with altered mental status.  Concern for UTI and questionable enteritis.  Ultimately required intubation for airway protection.  PCCM asked to admit.  Interim History / Subjective:  As outlined above under Significant Hospital Events section  Objective   Blood pressure (!) 177/85, pulse 78, temperature (!) 96.3 F (35.7 C), resp. rate 14, height 5' 6 (1.676 m), weight 97.5 kg, SpO2 100%.    Vent Mode: PRVC FiO2 (%):  [35 %] 35 % Set Rate:  [16 bmp] 16 bmp Vt Set:  [400 mL] 400 mL PEEP:  [5 cmH20] 5 cmH20   Intake/Output Summary (Last 24 hours) at 07/31/2024 1228 Last data filed at 07/31/2024 1224 Gross per 24 hour  Intake 6.81 ml  Output --  Net 6.81 ml   Filed Weights   07/31/24 0823  Weight: 97.5  kg    Examination: General: Critically ill-appearing male, laying in bed, intubated and sedated, no acute distress HENT: Atraumatic, normocephalic, neck supple, no JVD, orally intubated Lungs: Mechanical breath sounds throughout, even, nonlabored, synchronous with the vent Cardiovascular: Regular rate and rhythm, S1-S2, no murmurs, rubs, gallops Abdomen: Soft, nontender, nondistended, no guarding or tenderness, bowel sounds positive x4 Extremities: Normal bulk and tone, no deformities, no edema, no cyanosis, good peripheral perfusion Neuro: Sedated, starting to withdraw from painful stimuli, does not currently follow commands, PERRLA GU: Foley catheter in place draining yellow urine  Resolved Hospital Problem list     Assessment & Plan:   #Hepatic Encephalopathy #Sedation needs in setting of mechanical ventilation PMHx: TIA, suicidal ideation, PTSD CT Head negative for acute intracranial abnormality -Treatment of metabolic derangements and hyperammonemia as outlined below (Lactulose  and Rifaximin ) -Maintain a RASS  goal of 0 to -1 -Propofol  to maintain RASS goal -Avoid sedating medications as able -Daily wake up assessment -Check UDS  #Intubated for Airway Protection  #COPD without acute exacerbation  PMHx: COPD, asthma, pulmonary htn, sleep apnea  -Full vent support, implement lung protective strategies -Plateau pressures less than 30 cm H20 -Wean FiO2 & PEEP as tolerated to maintain O2 sats 88 to 92% -Follow intermittent Chest X-ray & ABG as needed -Spontaneous Breathing Trials when respiratory parameters met and mental status permits -Implement VAP Bundle -Prn Bronchodilators  #UTI #Concern for Enteritis #Low suspicion for SBP  PMHx: recent Klebsiella/Proteus/Enterococcus/Citrobacter/Myroides UTI in Nov. 2025 CT Abdomen without ascites -Monitor fever curve -Trend WBC's & Procalcitonin -Follow cultures as above -Continue empiric Ceftriaxone  pending cultures & sensitivities  #Cirrhosis PMHx: HBV/HCV -Trend LFT's and coags -Continue Lactulose  and Rifaxamin  #HFpEF without acute exacerbation PMHx: CAD, HTN, HLD Echocardiogram 06/30/24: LVEF 55 to 60%, normal diastolic parameters, RV systolic function normal -Continuous cardiac monitoring -Maintain MAP >65 -Cautious IV fluids -Vasopressors as needed to maintain MAP goal ~ not requiring  -Diuresis as BP and renal function permits  #Anemia  #Thrombocytopenia PMHx: Anemia, thrombocytopenia -Monitor for S/Sx of bleeding -Trend CBC -Heparin  for VTE Prophylaxis  -Transfuse for Hgb <7 -Transfuse Platelets for Platelet count < 10K; < 50K with active bleeding; < 100K with Neurosurgical procedures/processes   #Diabetes Mellitus Type II -CBG's q4h; Target range of 140 to 180 -SSI -Follow ICU Hypo/Hyperglycemia protocol         Pt is critically ill with multiorgan failure. Prognosis is guarded, high risk for further decompensation, cardiac arrest, and death.  Given current critical illness superimposed on multiple chronic  co-morbidities and advanced age, overall long term prognosis is poor.  Recommend consideration of DNR/DNI status.  Will consult Palliative Care to assist with GOC discussions.   Best Practice (right click and Reselect all SmartList Selections daily)   Diet/type: NPO DVT prophylaxis: prophylactic heparin   GI prophylaxis: PPI Lines: N/A Foley:  Yes, and it is still needed Code Status:  full code Last date of multidisciplinary goals of care discussion [N/A]   Labs   CBC: Recent Labs  Lab 07/31/24 0823  WBC 3.9*  NEUTROABS 2.9  HGB 10.1*  HCT 31.6*  MCV 92.9  PLT 118*    Basic Metabolic Panel: Recent Labs  Lab 07/28/24 1313 07/31/24 0952  NA 137 138  K 4.4 4.5  CL 104 107  CO2 26 21*  GLUCOSE 120* 131*  BUN 22 19  CREATININE 1.55* 1.40*  CALCIUM  9.1 9.8   GFR: Estimated Creatinine Clearance: 49.8 mL/min (A) (by C-G formula based on SCr of  1.4 mg/dL (H)). Recent Labs  Lab 07/31/24 0823  WBC 3.9*  LATICACIDVEN 3.8*    Liver Function Tests: Recent Labs  Lab 07/31/24 0952  AST 34  ALT 18  ALKPHOS 79  BILITOT 1.3*  PROT 7.4  ALBUMIN  3.0*   No results for input(s): LIPASE, AMYLASE in the last 168 hours. Recent Labs  Lab 07/31/24 0824  AMMONIA 258*    ABG    Component Value Date/Time   PHART 7.33 (L) 07/31/2024 1045   PCO2ART 46 07/31/2024 1045   PO2ART 134 (H) 07/31/2024 1045   HCO3 24.3 07/31/2024 1045   ACIDBASEDEF 1.9 07/31/2024 1045   O2SAT 99.8 07/31/2024 1045     Coagulation Profile: Recent Labs  Lab 07/31/24 0823  INR 1.6*    Cardiac Enzymes: No results for input(s): CKTOTAL, CKMB, CKMBINDEX, TROPONINI in the last 168 hours.  HbA1C: Hgb A1c MFr Bld  Date/Time Value Ref Range Status  09/30/2023 08:37 AM 4.9 4.8 - 5.6 % Final    Comment:    (NOTE) Pre diabetes:          5.7%-6.4%  Diabetes:              >6.4%  Glycemic control for   <7.0% adults with diabetes   06/27/2023 09:08 AM 5.0 4.8 - 5.6 % Final     Comment:    (NOTE) Pre diabetes:          5.7%-6.4%  Diabetes:              >6.4%  Glycemic control for   <7.0% adults with diabetes     CBG: No results for input(s): GLUCAP in the last 168 hours.  Review of Systems:   Unable to assess due to AMS/intubation/sedation/critical illness    Past Medical History:  He,  has a past medical history of (HFpEF) heart failure with preserved ejection fraction (HCC), Anasarca, Anemia, Anxiety, Aortic atherosclerosis, Arthritis, Asthma, Bacteremia due to Escherichia coli (05/2023), Bacteremia due to Klebsiella pneumoniae (2023), Bilateral carotid artery disease, Bradycardia, CAD (coronary artery disease) (10/21/2006), CAD (coronary artery disease), Cervical radiculopathy, Chest pain, Chronic back pain, Chronic common bile duct dilatation, Cirrhosis (HCC), COPD (chronic obstructive pulmonary disease) (HCC), DDD (degenerative disc disease), lumbar, Depression, Frequent falls, GERD (gastroesophageal reflux disease), HBV (hepatitis B virus) infection, HCV (hepatitis C virus), Hernia of anterior abdominal wall, History of 2019 novel coronavirus disease (COVID-19) (04/16/2020), Homelessness, Hyperlipidemia, Hypertension, Infestation by bed bug (02/2022), Malingering, Migraine, Nephrolithiasis, Obesity, Portal hypertension (HCC), PTSD (post-traumatic stress disorder), Pulmonary HTN (HCC), Sleep apnea, Sleep apnea, Stenosis of cervical spine with myelopathy (HCC), Suicidal ideations, T2DM (type 2 diabetes mellitus) (HCC), Thrombocytopenia, TIA (transient ischemic attack), and TIA (transient ischemic attack) (09/30/2023).   Surgical History:   Past Surgical History:  Procedure Laterality Date   ANTERIOR CERVICAL DECOMP/DISCECTOMY FUSION N/A 07/01/2023   Procedure: ANTERIOR CERVICAL DISCECTOMY AND FUSION (FORGE);  Surgeon: Clois Fret, MD;  Location: [facility] ORS;  Service: Neurosurgery;  Laterality: N/A;  Keep as last case of the day per Dr Clois    ANTERIOR CERVICAL DISCECTOMY     APPENDECTOMY     CARDIAC CATHETERIZATION     CHOLECYSTECTOMY     COLONOSCOPY N/A 04/01/2024   Procedure: COLONOSCOPY;  Surgeon: Toledo, Ladell POUR, MD;  Location: [facility] ENDOSCOPY;  Service: Gastroenterology;  Laterality: N/A;   ESOPHAGOGASTRODUODENOSCOPY N/A 04/01/2024   Procedure: EGD (ESOPHAGOGASTRODUODENOSCOPY);  Surgeon: Toledo, Ladell POUR, MD;  Location: [facility] ENDOSCOPY;  Service: Gastroenterology;  Laterality: N/A;   EYE SURGERY  INNER EAR SURGERY     IR CYSTOSTOMY TUBE PLACEMENT/BLADDER ASPIRATION  04/13/2024   NOSE SURGERY     POLYPECTOMY  04/01/2024   Procedure: POLYPECTOMY, INTESTINE;  Surgeon: Toledo, Ladell POUR, MD;  Location: [facility] ENDOSCOPY;  Service: Gastroenterology;;   XI ROBOTIC LAPAROSCOPIC ASSISTED APPENDECTOMY N/A 11/05/2022   Procedure: XI ROBOTIC LAPAROSCOPIC ASSISTED APPENDECTOMY;  Surgeon: Rodolph Romano, MD;  Location: [facility] ORS;  Service: General;  Laterality: N/A;     Social History:   reports that he has been smoking cigarettes. He has never used smokeless tobacco. He reports that he does not currently use alcohol. He reports that he does not currently use drugs.   Family History:  His family history includes Heart disease in his mother; Heart failure in his maternal grandmother; Hypertension in his mother.   Allergies Allergies  Allergen Reactions   Bee Venom Anaphylaxis   Codeine  Itching    Only itching. Recently allergy tested at [facility] Pre-F Virus Vaccine Swelling   Meloxicam     Other Reaction(s): ?overdose of local given at dentist--had trouble breathing   Novocain [Procaine]    Sulfa Antibiotics     hallucinations     Home Medications  Prior to Admission medications   Medication Sig Start Date End Date Taking? Authorizing Provider  acetaminophen  (TYLENOL ) 650 MG CR tablet Take 650 mg by mouth every 8 (eight) hours as needed for pain.   Yes [provider]  albuterol  (PROVENTIL ) (2.5 MG/3ML)  0.083% nebulizer solution Take 2.5 mg by nebulization every 4 (four) hours as needed for wheezing or shortness of breath. 07/16/23  Yes [provider]  aspirin  81 MG chewable tablet Chew 81 mg by mouth daily.   Yes [provider]  atorvastatin  (LIPITOR) 40 MG tablet Take 40 mg by mouth daily.   Yes [provider]  citalopram  (CELEXA ) 20 MG tablet Take 20 mg by mouth daily.   Yes [provider]  feeding supplement (ENSURE ENLIVE / ENSURE PLUS) LIQD Take 237 mLs by mouth 3 (three) times daily between meals. 10/25/23  Yes Barbarann Nest, MD  furosemide  (LASIX ) 40 MG tablet Take 40 mg by mouth daily.   Yes [provider]  gabapentin  (NEURONTIN ) 300 MG capsule Take 300 mg by mouth 2 (two) times daily. 06/07/23  Yes [provider]  lactulose  (CHRONULAC ) 10 GM/15ML solution Take 67.5 mLs (45 g total) by mouth 3 (three) times daily. Please titrate to have 2-3 soft bowel movements daily 05/16/24  Yes Gordan Huxley, MD  methocarbamol  (ROBAXIN ) 500 MG tablet Take 500 mg by mouth 4 (four) times daily.   Yes [provider]  Multiple Vitamin (MULTIVITAMIN WITH MINERALS) TABS tablet Take 1 tablet by mouth daily.   Yes [provider]  nitroGLYCERIN  (NITROLINGUAL ) 0.4 MG/SPRAY spray Place 1 spray under the tongue every 5 (five) minutes x 3 doses as needed for chest pain. 10/30/23  Yes Hackney, Ellouise A, FNP  ondansetron  (ZOFRAN ) 4 MG tablet Take 1 tablet (4 mg total) by mouth every 6 (six) hours as needed for nausea. 06/21/23  Yes Wieting, Richard, MD  ondansetron  (ZOFRAN -ODT) 8 MG disintegrating tablet Take 8 mg by mouth every 6 (six) hours as needed for nausea. 01/24/23  Yes [provider]  pantoprazole  (PROTONIX ) 40 MG tablet Take 40 mg by mouth daily. 09/24/23  Yes [provider]  phenol (CHLORASEPTIC) 1.4 % LIQD Use as directed 1 spray in the mouth or throat as needed for throat irritation /  pain.   Yes [provider]  QUEtiapine  (SEROQUEL ) 50 MG tablet Take 50 mg by mouth at bedtime. 11/19/23  Yes [provider]  senna (SENOKOT) 8.6 MG TABS tablet Take 1 tablet (8.6 mg total) by mouth 2 (two) times daily as needed for mild constipation. 07/02/23  Yes Gregory Edsel Ruth, PA  spironolactone  (ALDACTONE ) 25 MG tablet Take 0.5 tablets (12.5 mg total) by mouth daily. 07/03/24 08/02/24 Yes Al-Sultani, Anmar, MD  VENTOLIN  HFA 108 (90 Base) MCG/ACT inhaler Inhale 2 puffs into the lungs every 4 (four) hours as needed for wheezing. 07/16/23  Yes [provider]  benzocaine  (ORAJEL) 10 % mucosal gel Apply to the tip of the penis for penile pain up to four times daily prn for penile pain Patient not taking: Reported on 07/31/2024 06/11/24   Helon Kirsch A, PA-C  cefdinir  (OMNICEF ) 300 MG capsule Take 1 capsule (300 mg total) by mouth 2 (two) times daily. Patient not taking: Reported on 07/28/2024 07/25/24   Viviann Pastor, MD  XIFAXAN  550 MG TABS tablet Take 550 mg by mouth 2 (two) times daily. Patient not taking: Reported on 07/31/2024 09/24/23   [provider]     Critical care time: 60 minutes     Inge Lecher, AGACNP-BC [facility] Prefer epic messenger for cross cover needs If after hours, please call E-link

## 2024-07-31 NOTE — ED Triage Notes (Signed)
 Pt to ED via [facility] from [facility] for AMS. Pt LKW last night when going to bed. Pt found by staff this morning unresponsive, pt had defecated on self. Pt responsive to painful stimuli for EMS.

## 2024-07-31 NOTE — ED Provider Notes (Signed)
 [facility] Provider Note    Event Date/Time   First MD Initiated Contact with Patient 07/31/24 0818     (approximate)   History   Altered Mental Status   HPI  [name] is a 73 year old male with history of HTN, T2DM, COPD, CHF, hepatic encephalopathy presenting to the emergency department for evaluation of altered mental status.  Arrives from a group home.  This morning was noted to be unresponsive.  Some response to pain with EMS.  No clear last known well.  Patient unable to provide history.  Reviewed discharge summary from 07/03/2024.  Patient presented with fever and lethargy.  Found to have sepsis secondary to bacteremia from a Klebsiella UTI in the setting of an indwelling Foley.  Was also noted to have acute hepatic encephalopathy that improved with lactulose  and rifaximin .     Physical Exam   Triage Vital Signs: ED Triage Vitals  Encounter Vitals Group     BP      Girls Systolic BP Percentile      Girls Diastolic BP Percentile      Boys Systolic BP Percentile      Boys Diastolic BP Percentile      Pulse      Resp      Temp      Temp src      SpO2      Weight      Height      Head Circumference      Peak Flow      Pain Score      Pain Loc      Pain Education      Exclude from Growth Chart     Most recent vital signs: Vitals:   07/31/24 0930 07/31/24 1000  BP: (!) 163/69 (!) 200/87  Pulse: 62 89  Resp: 14 20  Temp:    SpO2: 100% 100%     General: Mild response to painful stimuli, otherwise unresponsive, ill-appearing CV:  Good peripheral perfusion Resp:  Unlabored respirations Abd:  Nondistended, no appreciable tenderness, no significant ascites on bedside ultrasound Neuro:  Gross facial asymmetry, intermittently responds to painful stimuli but no obvious focal deficits, eyes open, frequent roving back-and-forth movements, crosses midline, pupils reactive Other:   Soiled with stool, indwelling Foley in place,  exchanged   ED Results / Procedures / Treatments   Labs (all labs ordered are listed, but only abnormal results are displayed) Labs Reviewed  LACTIC ACID, PLASMA - Abnormal; Notable for the following components:      Result Value   Lactic Acid, Venous 3.8 (*)    All other components within normal limits  CBC WITH DIFFERENTIAL/PLATELET - Abnormal; Notable for the following components:   WBC 3.9 (*)    RBC 3.40 (*)    Hemoglobin 10.1 (*)    HCT 31.6 (*)    RDW 18.6 (*)    Platelets 118 (*)    Lymphs Abs 0.5 (*)    All other components within normal limits  PROTIME-INR - Abnormal; Notable for the following components:   Prothrombin Time 20.1 (*)    INR 1.6 (*)    All other components within normal limits  URINALYSIS, W/ REFLEX TO CULTURE (INFECTION SUSPECTED) - Abnormal; Notable for the following components:   Color, Urine YELLOW (*)    APPearance CLOUDY (*)    Hgb urine dipstick MODERATE (*)    Protein, ur 30 (*)    Leukocytes,Ua LARGE (*)  Bacteria, UA FEW (*)    All other components within normal limits  AMMONIA - Abnormal; Notable for the following components:   Ammonia 258 (*)    All other components within normal limits  COMPREHENSIVE METABOLIC PANEL WITH GFR - Abnormal; Notable for the following components:   CO2 21 (*)    Glucose, Bld 131 (*)    Creatinine, Ser 1.40 (*)    Albumin  3.0 (*)    Total Bilirubin 1.3 (*)    GFR, Estimated 52 (*)    All other components within normal limits  CULTURE, BLOOD (ROUTINE X 2)  CULTURE, BLOOD (ROUTINE X 2)  URINE CULTURE  LACTIC ACID, PLASMA  BLOOD GAS, ARTERIAL     EKG EKG independently reviewed and interpreted by myself demonstrates:  EKG demonstrates sinus rhythm at rate 70, PR 186, QRS 118, QTc 467, no acute ST changes  RADIOLOGY Imaging independently reviewed and interpreted by myself demonstrates:  CT abdomen pelvis with possible enteritis, no bowel obstruction, no significant ascites Chest x-Jeraldean Wechter without  focal consolidation CT head without acute bleed  Formal Radiology Read:  CT ABDOMEN PELVIS WO CONTRAST Result Date: 07/31/2024 EXAM: CT ABDOMEN AND PELVIS WITHOUT CONTRAST 07/31/2024 09:28:22 AM TECHNIQUE: CT of the abdomen and pelvis was performed without the administration of intravenous contrast. Multiplanar reformatted images are provided for review. Automated exposure control, iterative reconstruction, and/or weight-based adjustment of the mA/kV was utilized to reduce the radiation dose to as low as reasonably achievable. COMPARISON: CT abdomen and pelvis 19-Apr-2024. CLINICAL HISTORY: 73 year old male. Abdominal pain, acute, nonlocalized. FINDINGS: LOWER CHEST: No pericardial or pleural effusion. Lower lung volumes, mild respiratory motion and increased patchy bibasilar opacity which most resembles atelectasis. LIVER: Nodular, cirrhotic liver. GALLBLADDER AND BILE DUCTS: Chronic cholecystectomy. Chronic bile duct dilatation is stable. SPLEEN: Stable mild splenomegaly. PANCREAS: No acute abnormality. ADRENAL GLANDS: No acute abnormality. KIDNEYS, URETERS AND BLADDER: Chronic asymmetric renal atrophy and nephrolithiasis. Foley catheter in place, positioning appears appropriate and the urinary bladder is decompressed. Underlying bladder wall thickening, and mild inflammatory stranding along the bladder wall (series 7 image 79) although less pronounced compared to 2024-04-19. Suprapubic catheter has been removed since 2024-04-19. No hydronephrosis. No perinephric or periureteral stranding. GI AND BOWEL: Small volume retained fluid in the stomach. Decompressed duodenum. Cecum is on a lax mesentery located partially in the right upper quadrant. Appendix not identified. Fluid throughout nondilated large and small bowel loops suspicious for acute enteritis / diarrhea. Layering fluid level in the rectum. No focal bowel inflammation identified. There is no bowel obstruction. PERITONEUM AND RETROPERITONEUM: No  ascites. No free air. VASCULATURE: Aorta is normal in caliber. Mild for age aortoiliac calcified atherosclerosis. LYMPH NODES: No lymphadenopathy. REPRODUCTIVE ORGANS: No acute abnormality. BONES AND SOFT TISSUES: Chronic spine degeneration, especially lumbar facet arthropathy. Stable postoperative chronic left lateral ventral abdominal wall hernia now contains only mesentery on series 7 image 47, plus a small volume of free fluid within the hernia sac (series 7 image 49). No regional hernia. No herniated bowel today. No acute osseous abnormality. No focal soft tissue abnormality. IMPRESSION: 1. Fluid containing nondilated small and large bowel suggesting acute enteritis/diarrhea. 2. Foley catheter with no adverse features. Bladder wall thickening and mild inflammation, but decreased from 04-19-2024. 3. Cirrhosis with mild splenomegaly. Chronic renal atrophy and nephrolithiasis. 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 Lung base atelectasis. Electronically signed by: Helayne Hurst MD 07/31/2024 09:56 AM EST RP Workstation: [id number]   CT Head Wo  Contrast Result Date: 07/31/2024 EXAM: CT HEAD WITHOUT CONTRAST 07/31/2024 09:28:22 AM TECHNIQUE: CT of the head was performed without the administration of intravenous contrast. Automated exposure control, iterative reconstruction, and/or weight based adjustment of the mA/kV was utilized to reduce the radiation dose to as low as reasonably achievable. COMPARISON: Head CT 07/18/2024, Brain MRI 09/22/2022. CLINICAL HISTORY: 73 year old male found unresponsive. FINDINGS: BRAIN AND VENTRICLES: No acute hemorrhage. No evidence of acute infarct. No hydrocephalus. No extra-axial collection. No mass effect or midline shift. Brain volume remains normal for age. Gray white differentiation stable and normal for age. No suspicious intracranial vascular hyperdensity. Mild calcified atherosclerosis at the skull base. ORBITS: No acute abnormality.  Stable small chronic metallic cerclage wire along the right superior orbital rim. SINUSES: Paranasal sinuses remain well aerated. SOFT TISSUES AND SKULL: No acute soft tissue abnormality. No skull fracture. Tympanic cavities and mastoid air cells remain well aerated. IMPRESSION: 1. No acute traumatic injury identified. 2. Stable and normal for age non-contrast CT appearance of the brain. Electronically signed by: Helayne Hurst MD 07/31/2024 09:36 AM EST RP Workstation: [id number]   DG Chest Port 1 View Result Date: 07/31/2024 CLINICAL DATA:  Questionable sepsis - evaluate for abnormality EXAM: PORTABLE CHEST - 1 VIEW COMPARISON:  06/26/2024 FINDINGS: No focal airspace consolidation, pleural effusion, or pneumothorax. Mild cardiomegaly. Tortuous aorta with aortic atherosclerosis. No acute fracture or destructive lesions. Multilevel thoracic osteophytosis. Cervical fusion hardware. Right upper quadrant surgical clips. IMPRESSION: Mild cardiomegaly. No pneumonia or pulmonary edema. Electronically Signed   By: Rogelia Myers M.D.   On: 07/31/2024 09:05    PROCEDURES:  Critical Care performed: Yes, see critical care procedure note(s)  CRITICAL CARE Performed by: Nilsa Dade   Total critical care time: 32 minutes  Critical care time was exclusive of separately billable procedures and treating other patients.  Critical care was necessary to treat or prevent imminent or life-threatening deterioration.  Critical care was time spent personally by me on the following activities: development of treatment plan with patient and/or surrogate as well as nursing, discussions with consultants, evaluation of patient's response to treatment, examination of patient, obtaining history from patient or surrogate, ordering and performing treatments and interventions, ordering and review of laboratory studies, ordering and review of radiographic studies, pulse oximetry and re-evaluation of patient's condition.   Procedure  Name: Intubation Date/Time: 07/31/2024 10:28 AM  Performed by: Dade Nilsa, MDPre-anesthesia Checklist: Patient identified Oxygen Delivery Method: Nasal cannula Preoxygenation: Pre-oxygenation with 100% oxygen Laryngoscope Size: Glidescope and 4 Grade View: Grade I Tube size: 8.0 mm Number of attempts: 1 Airway Equipment and Method: Rigid stylet Placement Confirmation: ETT inserted through vocal cords under direct vision Secured at: 23 cm Tube secured with: ETT holder Dental Injury: Teeth and Oropharynx as per pre-operative assessment        MEDICATIONS ORDERED IN ED: Medications  propofol  (DIPRIVAN ) 1000 MG/100ML infusion (5 mcg/kg/min  97.5 kg Intravenous New Bag/Given 07/31/24 1004)  lactulose  (CHRONULAC ) 10 GM/15ML solution 30 g (has no administration in time range)  sodium chloride  0.9 % bolus 1,000 mL (has no administration in time range)  ceFEPIme  (MAXIPIME ) 2 g in sodium chloride  0.9 % 100 mL IVPB (2 g Intravenous New Bag/Given 07/31/24 0955)  ketamine  50 mg in normal saline 5 mL (10 mg/mL) syringe (100 mg Intravenous Given 07/31/24 0951)  rocuronium  (ZEMURON ) injection 100 mg (100 mg Intravenous Given 07/31/24 0952)     IMPRESSION / MDM / ASSESSMENT AND PLAN / ED COURSE  I reviewed the triage  vital signs and the nursing notes.  Differential diagnosis includes, but is not limited to, hepatic encephalopathy, intracranial bleed, UTI, pneumonia, lower suspicion of SBP in the absence of significant ascites  Patient's presentation is most consistent with acute presentation with potential threat to life or bodily function.  73 year old male presenting altered from baseline.  With roving eye movements and history of cirrhosis I am concerned about hepatic encephalopathy.  He does have diminished mental status here, but is managing his secretions and responding to pain.  With this we will closely watch mental status but hold off on intubation to begin with.  Labs with mild leukopenia  and anemia elevated lactate at 3.8, ammonia significantly elevated at 258, increased from patient's recent priors.  Urine concerning for infection, was taken from fresh Foley after replacement but has chronic Foley so potential colonization.  Most recent culture Rocephin  resistant but sensitive to cefepime , so ordered for this.  CT head obtained without acute bleed, CT abdomen pelvis with questionable enteritis, otherwise without significant findings.  Chest x-Maddyx Vallie without focal findings.  Patient did have worsening mental status after returning from CT and decision was made to proceed with intubation as above.  He was placed on propofol  for postintubation sedation.  Case was discussed with Dr. Aleskerov with the ICU team.  His team will evaluate for anticipated admission.  Clinical Course as of 07/31/24 1029  Fri Jul 31, 2024  9063 Shortly after returning from CT patient was noted to have sonorous respirations.  He is slightly less responsive than on initial presentation.  With this, we will proceed with intubation. [NR]    Clinical Course User Index [NR] Levander Slate, MD     FINAL CLINICAL IMPRESSION(S) / ED DIAGNOSES   Final diagnoses:  Hepatic encephalopathy (HCC)  Urinary tract infection without hematuria, site unspecified     Rx / DC Orders   ED Discharge Orders     None        Note:  This document was prepared using Dragon voice recognition software and may include unintentional dictation errors.   Levander Slate, MD 07/31/24 [phone]

## 2024-07-31 NOTE — Progress Notes (Signed)
 PHARMACY CONSULT NOTE - ELECTROLYTES  Pharmacy Consult for Electrolyte Monitoring and Replacement   Recent Labs: Height: 5' 6 (167.6 cm) Weight: 97.5 kg (215 lb) IBW/kg (Calculated) : 63.8 Estimated Creatinine Clearance: 49.8 mL/min (A) (by C-G formula based on SCr of 1.4 mg/dL (H)). Potassium (mmol/L)  Date Value  07/31/2024 4.5   Magnesium  (mg/dL)  Date Value  89/68/7974 1.7   Calcium  (mg/dL)  Date Value  87/94/7974 9.8   Albumin  (g/dL)  Date Value  87/94/7974 3.0 (L)  06/05/2018 3.3 (L)   Phosphorus (mg/dL)  Date Value  91/78/7974 2.4 (L)   Sodium (mmol/L)  Date Value  07/31/2024 138  01/31/2024 144    Assessment  [name] is a 73 y.o. male presenting with hepatic encephalopathy. PMH significant for HTN, T2DM, COPD, CHF, hepatic encephalopathy . Pharmacy has been consulted to monitor and replace electrolytes.  Diet: NPO MIVF: None  Pertinent medications: None  Goal of Therapy: Electrolytes WNL  Plan:  No electrolyte replacement warranted at this time Check BMP, Mg, Phos with AM labs  Thank you for allowing pharmacy to be a part of this patient's care.   Ransom Blanch PGY-1 Pharmacy Resident  [place] [facility]  07/31/2024 12:41 PM

## 2024-08-01 ENCOUNTER — Other Ambulatory Visit: Payer: Self-pay

## 2024-08-01 ENCOUNTER — Encounter: Payer: Self-pay | Admitting: Pulmonary Disease

## 2024-08-01 LAB — MAGNESIUM: Magnesium: 2 mg/dL (ref 1.7–2.4)

## 2024-08-01 LAB — CBC
HCT: 31.9 % — ABNORMAL LOW (ref 39.0–52.0)
Hemoglobin: 10.4 g/dL — ABNORMAL LOW (ref 13.0–17.0)
MCH: 30.7 pg (ref 26.0–34.0)
MCHC: 32.6 g/dL (ref 30.0–36.0)
MCV: 94.1 fL (ref 80.0–100.0)
Platelets: 93 K/uL — ABNORMAL LOW (ref 150–400)
RBC: 3.39 MIL/uL — ABNORMAL LOW (ref 4.22–5.81)
RDW: 18.7 % — ABNORMAL HIGH (ref 11.5–15.5)
WBC: 6.8 K/uL (ref 4.0–10.5)
nRBC: 0 % (ref 0.0–0.2)

## 2024-08-01 LAB — COMPREHENSIVE METABOLIC PANEL WITH GFR
ALT: 16 U/L (ref 0–44)
AST: 38 U/L (ref 15–41)
Albumin: 2.8 g/dL — ABNORMAL LOW (ref 3.5–5.0)
Alkaline Phosphatase: 75 U/L (ref 38–126)
Anion gap: 9 (ref 5–15)
BUN: 19 mg/dL (ref 8–23)
CO2: 20 mmol/L — ABNORMAL LOW (ref 22–32)
Calcium: 9.5 mg/dL (ref 8.9–10.3)
Chloride: 111 mmol/L (ref 98–111)
Creatinine, Ser: 1.3 mg/dL — ABNORMAL HIGH (ref 0.61–1.24)
GFR, Estimated: 57 mL/min — ABNORMAL LOW (ref >60)
Glucose, Bld: 79 mg/dL (ref 70–99)
Potassium: 4.3 mmol/L (ref 3.5–5.1)
Sodium: 140 mmol/L (ref 135–145)
Total Bilirubin: 1.4 mg/dL — ABNORMAL HIGH (ref 0.0–1.2)
Total Protein: 7.3 g/dL (ref 6.5–8.1)

## 2024-08-01 LAB — URINE CULTURE

## 2024-08-01 LAB — GLUCOSE, CAPILLARY
Glucose-Capillary: 106 mg/dL — ABNORMAL HIGH (ref 70–99)
Glucose-Capillary: 66 mg/dL — ABNORMAL LOW (ref 70–99)
Glucose-Capillary: 72 mg/dL (ref 70–99)
Glucose-Capillary: 73 mg/dL (ref 70–99)
Glucose-Capillary: 82 mg/dL (ref 70–99)
Glucose-Capillary: 85 mg/dL (ref 70–99)
Glucose-Capillary: 96 mg/dL (ref 70–99)

## 2024-08-01 LAB — BLOOD GAS, ARTERIAL
Acid-base deficit: 1.7 mmol/L (ref 0.0–2.0)
Bicarbonate: 21.9 mmol/L (ref 20.0–28.0)
FIO2: 28 %
MECHVT: 400 mL
O2 Saturation: 99.8 %
PEEP: 5 cmH2O
Patient temperature: 37
RATE: 16 {breaths}/min
pCO2 arterial: 33 mmHg (ref 32–48)
pH, Arterial: 7.43 (ref 7.35–7.45)
pO2, Arterial: 129 mmHg — ABNORMAL HIGH (ref 83–108)

## 2024-08-01 LAB — PRO BRAIN NATRIURETIC PEPTIDE: Pro Brain Natriuretic Peptide: 655 pg/mL — ABNORMAL HIGH (ref <300.0)

## 2024-08-01 LAB — TRIGLYCERIDES: Triglycerides: 60 mg/dL (ref <150)

## 2024-08-01 LAB — LACTIC ACID, PLASMA
Lactic Acid, Venous: 2.5 mmol/L (ref 0.5–1.9)
Lactic Acid, Venous: 1.3 mmol/L (ref 0.5–1.9)

## 2024-08-01 LAB — PHOSPHORUS: Phosphorus: 2.4 mg/dL — ABNORMAL LOW (ref 2.5–4.6)

## 2024-08-01 MED ORDER — ORAL CARE MOUTH RINSE: 15.0000 mL | OROMUCOSAL | Status: DC | PRN

## 2024-08-01 MED ORDER — DEXTROSE 50 % IV SOLN
12.5000 g | INTRAVENOUS | Status: AC
  Administered 2024-08-01: 12.5 g via INTRAVENOUS
  Filled 2024-08-01: qty 50

## 2024-08-01 MED ORDER — MORPHINE SULFATE (PF) 2 MG/ML IV SOLN
1.0000 mg | INTRAVENOUS | Status: DC | PRN
  Administered 2024-08-01 – 2024-08-03 (×5): 2 mg via INTRAVENOUS
  Filled 2024-08-01 (×5): qty 1

## 2024-08-01 MED ORDER — ZINC SULFATE 220 (50 ZN) MG PO CAPS
220.0000 mg | ORAL_CAPSULE | Freq: Every day | ORAL | Status: DC
  Administered 2024-08-03 – 2024-08-06 (×4): 220 mg via ORAL
  Filled 2024-08-01 (×4): qty 1

## 2024-08-01 MED ORDER — POTASSIUM & SODIUM PHOSPHATES 280-160-250 MG PO PACK
1.0000 | PACK | Freq: Two times a day (BID) | ORAL | Status: DC
  Administered 2024-08-01: 1
  Filled 2024-08-01: qty 1

## 2024-08-01 MED ORDER — LACTULOSE 10 GM/15ML PO SOLN: 30.0000 g | Freq: Three times a day (TID) | ORAL | Status: DC

## 2024-08-01 MED ORDER — DEXTROSE 50 % IV SOLN
INTRAVENOUS | Status: AC
  Administered 2024-08-01: 12.5 g via INTRAVENOUS
  Filled 2024-08-01: qty 50

## 2024-08-01 MED ORDER — LACTULOSE ENEMA
300.0000 mL | Freq: Two times a day (BID) | ORAL | Status: DC
  Administered 2024-08-01 – 2024-08-02 (×3): 300 mL via RECTAL
  Filled 2024-08-01 (×4): qty 300

## 2024-08-01 MED ORDER — HYDRALAZINE HCL 20 MG/ML IJ SOLN
10.0000 mg | INTRAMUSCULAR | Status: DC | PRN
  Administered 2024-08-01: 10 mg via INTRAVENOUS
  Filled 2024-08-01: qty 1

## 2024-08-01 MED ORDER — CHLORHEXIDINE GLUCONATE CLOTH 2 % EX PADS
6.0000 | MEDICATED_PAD | Freq: Every day | CUTANEOUS | Status: DC
  Administered 2024-08-01 – 2024-08-06 (×6): 6 via TOPICAL

## 2024-08-01 MED ORDER — ORAL CARE MOUTH RINSE
15.0000 mL | OROMUCOSAL | Status: DC
  Administered 2024-08-01 (×4): 15 mL via OROMUCOSAL

## 2024-08-01 MED ORDER — SODIUM CHLORIDE 0.9 % IV SOLN
1.0000 g | Freq: Three times a day (TID) | INTRAVENOUS | Status: DC
  Administered 2024-08-01 – 2024-08-03 (×6): 1 g via INTRAVENOUS
  Filled 2024-08-01 (×7): qty 20

## 2024-08-01 MED ORDER — RIFAXIMIN 550 MG PO TABS
550.0000 mg | ORAL_TABLET | Freq: Two times a day (BID) | ORAL | Status: DC
  Administered 2024-08-02 – 2024-08-06 (×8): 550 mg via ORAL
  Filled 2024-08-01 (×8): qty 1

## 2024-08-01 MED ORDER — ACETAMINOPHEN 10 MG/ML IV SOLN
1000.0000 mg | Freq: Four times a day (QID) | INTRAVENOUS | Status: DC | PRN
  Administered 2024-08-01: 1000 mg via INTRAVENOUS
  Filled 2024-08-01: qty 100

## 2024-08-01 MED ORDER — POTASSIUM & SODIUM PHOSPHATES 280-160-250 MG PO PACK: 1.0000 | PACK | Freq: Two times a day (BID) | ORAL | Status: AC

## 2024-08-01 MED ORDER — DEXTROSE 50 % IV SOLN: 12.5000 g | INTRAVENOUS | Status: AC

## 2024-08-01 NOTE — Progress Notes (Signed)
 Pt placed in SBT 5/5 FiO2 28% with O2 sats 100% and respiratory rate 16 will continue to monitor and assess pt.   Lonell Moose, AGNP  Pulmonary/Critical Care Pager [phone] (please enter 7 digits) PCCM Consult Pager [phone] (please enter 7 digits)

## 2024-08-01 NOTE — Progress Notes (Signed)
 NAME:  [name], MRN:  [id number], DOB:  September 21, 1950, LOS: 1 ADMISSION DATE:  07/31/2024, CONSULTATION DATE:  07/31/2024 REFERRING MD:  Dr. Levander, CHIEF COMPLAINT:  Altered Mental Status   Brief Pt Description / Synopsis:  73 year old male admitted with hepatic encephalopathy requiring intubation for airway protection, along with concern for UTI and enteritis.  History of Present Illness:  [name] is a 73 year old male with a past medical history significant for hepatic encephalopathy, COPD, CHF, hypertension, type 2 diabetes mellitus who presented to [facility] ED on 07/31/2024 for evaluation of altered mental status.  Patient is currently intubated and sedated unable to contribute to history, no family currently available, therefore history obtained from chart review.  It is reported that he was sent from his group home after being found unresponsive, with his last known well time occurring when he went to bed last night.  EMS reported some response to painful stimuli.  Of note he was recently admitted on 07/03/2024 after presenting with fever and lethargy.  He was found to be septic due to bacteremia from Klebsiella UTI in the setting of an indwelling Foley catheter along with acute hepatic encephalopathy that improved with lactulose  and rifaximin .   ED Course: Initial Vital Signs: Temperature 96.6 F rectally, pulse 66, respiratory rate 13, blood pressure 151/67, SpO2 100% on room air Significant Labs: WBC 3.9, hemoglobin 10.1, platelets 118, lactic acid 3.8, INR 1.6, PT 20.1, glucose 131, creatinine 1.4, BUN 19 Urinalysis consistent with UTI Imaging Chest X-ray>>IMPRESSION: Mild cardiomegaly. No pneumonia or pulmonary edema. CT Head wo contrast>>IMPRESSION: 1. No acute traumatic injury identified. 2. Stable and normal for age non-contrast CT appearance of the brain. CT Abdomen & Pelvis wo contrast>>IMPRESSION: 1. Fluid containing nondilated small and large bowel suggesting  acute enteritis/diarrhea. 2. Foley catheter with no adverse features. Bladder wall thickening and mild inflammation, but decreased from 04/14/2024. 3. Cirrhosis with mild splenomegaly. Chronic renal atrophy and nephrolithiasis. 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 Lung base atelectasis. Medications Administered: IV cefepime   Initially he was managing his secretions, however after returning from CT he had worsening mental status with snoring respirations requiring intubation for airway protection.  PCCM asked to admit for further workup and treatment.  Please see Significant Hospital Events section below for full detailed hospital course.  08/01/24- patient has been liberated from mechanical ventilation. Patient is asking for ativan  and narcotics post extubation.   Pertinent  Medical History   Past Medical History:  Diagnosis Date   (HFpEF) heart failure with preserved ejection fraction (HCC)    a.) TTE 03/20/2018: EF 60-65%, no RWMAs, mild LAE, mild-mod MR, PASP 42; b.) TTE 03/31/2021: EF 55-60%, no RWMAs, mild LVH, mild LAE, AoV sclerosis without stenosis; c.) TTE 03/25/2022: EF 60-65%, no RWMAs, AoV sclerosis without stenosis; d.) TTE 12/24/2022: EF 60-65%, no RWMAs, mild LVH, mild LAE.   Anasarca    Anemia    Anxiety    Aortic atherosclerosis    Arthritis    Asthma    Bacteremia due to Escherichia coli 05/2023   Bacteremia due to Klebsiella pneumoniae 2023   Bilateral carotid artery disease    Bradycardia    CAD (coronary artery disease) 10/21/2006   a.) MV 10/21/2006: small reversible defect involving the apical  lateral wall c/w possible ischemia; b.) MV: 03/24/2018: no ischemia; c.) MV 10/04/2021: LAD calcifications   CAD (coronary artery disease)    Cervical radiculopathy    Chest pain    a.)  MV 03/21/2018: no ischemia; b.) MV 10/04/2021: no ischemia   Chronic back pain    Chronic common bile duct dilatation    Cirrhosis (HCC)     a.) (+) hepatic lesion on CT; likely hepatocellular carcinoma; b.) CT evidence of associated portal hypertension; c.) (+) abdominopelvic anasarca   COPD (chronic obstructive pulmonary disease) (HCC)    DDD (degenerative disc disease), lumbar    Depression    Frequent falls    GERD (gastroesophageal reflux disease)    HBV (hepatitis B virus) infection    HCV (hepatitis C virus)    Hernia of anterior abdominal wall    History of 2019 novel coronavirus disease (COVID-19) 04/16/2020   Homelessness    a.) limited on placement as patient is listed on [place] sex offender listing for exploitation of a minor (possession of child pornography); incarcerated 12 years (released 11/2017)   Hyperlipidemia    Hypertension    Infestation by bed bug 02/2022   Malingering    Migraine    Nephrolithiasis    Obesity    Portal hypertension (HCC)    PTSD (post-traumatic stress disorder)    a.) brief training in the [employer] at age 56; someone was killed in front of him   Pulmonary HTN (HCC)    a.) multiple CT scans desmontrating dilated PA c/w pHTN; b.) TTE 03/20/2018: PASP 42 mmHg   Sleep apnea    a.) no nocturnal PAP therapy   Sleep apnea    Stenosis of cervical spine with myelopathy (HCC)    Suicidal ideations    T2DM (type 2 diabetes mellitus) (HCC)    Thrombocytopenia    a.) related to hepatic cirrhosis; spleen normal in size   TIA (transient ischemic attack)    TIA (transient ischemic attack) 09/30/2023    Micro Data:  12/5: Blood cultures x 2>> 12/5: Urine>> 12/5: MRSA PCR negative  Antimicrobials:   Anti-infectives (From admission, onward)    Start     Dose/Rate Route Frequency Ordered Stop   08/01/24 2200  rifaximin  (XIFAXAN ) tablet 550 mg        550 mg Oral 2 times daily 08/01/24 1039     08/01/24 1345  meropenem  (MERREM ) 1 g in sodium chloride  0.9 % 100 mL IVPB        1 g 200 mL/hr over 30 Minutes Intravenous Every 8 hours 08/01/24 1252     07/31/24 2000  cefTRIAXone  (ROCEPHIN ) 2 g  in sodium chloride  0.9 % 100 mL IVPB  Status:  Discontinued        2 g 200 mL/hr over 30 Minutes Intravenous Every 24 hours 07/31/24 1227 08/01/24 1252   07/31/24 1400  rifaximin  (XIFAXAN ) tablet 550 mg  Status:  Discontinued        550 mg Per Tube 2 times daily 07/31/24 1227 08/01/24 1039   07/31/24 0930  ceFEPIme  (MAXIPIME ) 2 g in sodium chloride  0.9 % 100 mL IVPB        2 g 200 mL/hr over 30 Minutes Intravenous  Once 07/31/24 0929 07/31/24 1025       Significant Hospital Events: Including procedures, antibiotic start and stop dates in addition to other pertinent events   12/5: Presented to ED with altered mental status.  Concern for UTI and questionable enteritis.  Ultimately required intubation for airway protection.  PCCM asked to admit.  Interim History / Subjective:  As outlined above under Significant Hospital Events section  Objective   Blood pressure 128/60, pulse 72, temperature 98.4 F (  36.9 C), temperature source Oral, resp. rate 12, height 5' 6 (1.676 m), weight 95.9 kg, SpO2 100%.    Vent Mode: PSV;Spontaneous FiO2 (%):  [28 %-30 %] 28 % Set Rate:  [16 bmp] 16 bmp Vt Set:  [400 mL] 400 mL PEEP:  [5 cmH20] 5 cmH20 Pressure Support:  [5 cmH20] 5 cmH20 Plateau Pressure:  [12 cmH20] 12 cmH20   Intake/Output Summary (Last 24 hours) at 08/01/2024 1719 Last data filed at 08/01/2024 1600 Gross per 24 hour  Intake 484.06 ml  Output 1435 ml  Net -950.94 ml   Filed Weights   07/31/24 0823 08/01/24 0400  Weight: 97.5 kg 95.9 kg    Examination: General: Critically ill-appearing male, laying in bed, intubated and sedated, no acute distress HENT: Atraumatic, normocephalic, neck supple, no JVD, orally intubated Lungs: Mechanical breath sounds throughout, even, nonlabored, synchronous with the vent Cardiovascular: Regular rate and rhythm, S1-S2, no murmurs, rubs, gallops Abdomen: Soft, nontender, nondistended, no guarding or tenderness, bowel sounds positive  x4 Extremities: Normal bulk and tone, no deformities, no edema, no cyanosis, good peripheral perfusion Neuro: Sedated, starting to withdraw from painful stimuli, does not currently follow commands, PERRLA GU: Foley catheter in place draining yellow urine  Resolved Hospital Problem list     Assessment & Plan:   #Hepatic Encephalopathy #Sedation needs in setting of mechanical ventilation PMHx: TIA, suicidal ideation, PTSD CT Head negative for acute intracranial abnormality -Treatment of metabolic derangements and hyperammonemia as outlined below (Lactulose  and Rifaximin ) -Maintain a RASS goal of 0 to -1 -Propofol  to maintain RASS goal -Avoid sedating medications as able -Daily wake up assessment -Check UDS  #Intubated for Airway Protection  #COPD without acute exacerbation  PMHx: COPD, asthma, pulmonary htn, sleep apnea  -Full vent support, implement lung protective strategies -Plateau pressures less than 30 cm H20 -Wean FiO2 & PEEP as tolerated to maintain O2 sats 88 to 92% -Follow intermittent Chest X-ray & ABG as needed -Spontaneous Breathing Trials when respiratory parameters met and mental status permits -Implement VAP Bundle -Prn Bronchodilators  #UTI #Concern for Enteritis #Low suspicion for SBP  PMHx: recent Klebsiella/Proteus/Enterococcus/Citrobacter/Myroides UTI in Nov. 2025 CT Abdomen without ascites -Monitor fever curve -Trend WBC's & Procalcitonin -Follow cultures as above -Continue empiric Ceftriaxone  pending cultures & sensitivities  #Cirrhosis PMHx: HBV/HCV -Trend LFT's and coags -Continue Lactulose  and Rifaxamin  #HFpEF without acute exacerbation PMHx: CAD, HTN, HLD Echocardiogram 06/30/24: LVEF 55 to 60%, normal diastolic parameters, RV systolic function normal -Continuous cardiac monitoring -Maintain MAP >65 -Cautious IV fluids -Vasopressors as needed to maintain MAP goal ~ not requiring  -Diuresis as BP and renal function permits  #Anemia   #Thrombocytopenia PMHx: Anemia, thrombocytopenia -Monitor for S/Sx of bleeding -Trend CBC -Heparin  for VTE Prophylaxis  -Transfuse for Hgb <7 -Transfuse Platelets for Platelet count < 10K; < 50K with active bleeding; < 100K with Neurosurgical procedures/processes   #Diabetes Mellitus Type II -CBG's q4h; Target range of 140 to 180 -SSI -Follow ICU Hypo/Hyperglycemia protocol    Best Practice (right click and Reselect all SmartList Selections daily)   Diet/type: NPO DVT prophylaxis: prophylactic heparin   GI prophylaxis: PPI Lines: N/A Foley:  Yes, and it is still needed Code Status:  full code Last date of multidisciplinary goals of care discussion [N/A]   Labs   CBC: Recent Labs  Lab 07/31/24 0823 08/01/24 0328  WBC 3.9* 6.8  NEUTROABS 2.9  --   HGB 10.1* 10.4*  HCT 31.6* 31.9*  MCV 92.9 94.1  PLT 118* 93*  Basic Metabolic Panel: Recent Labs  Lab 07/28/24 1313 07/31/24 0952 08/01/24 0204  NA 137 138 140  K 4.4 4.5 4.3  CL 104 107 111  CO2 26 21* 20*  GLUCOSE 120* 131* 79  BUN 22 19 19   CREATININE 1.55* 1.40* 1.30*  CALCIUM  9.1 9.8 9.5  MG  --   --  2.0  PHOS  --   --  2.4*   GFR: Estimated Creatinine Clearance: 53.2 mL/min (A) (by C-G formula based on SCr of 1.3 mg/dL (H)). Recent Labs  Lab 07/31/24 0823 07/31/24 1315 08/01/24 0204 08/01/24 0328 08/01/24 1134  WBC 3.9*  --   --  6.8  --   LATICACIDVEN 3.8* 3.4* 2.5*  --  1.3    Liver Function Tests: Recent Labs  Lab 07/31/24 0952 08/01/24 0204  AST 34 38  ALT 18 16  ALKPHOS 79 75  BILITOT 1.3* 1.4*  PROT 7.4 7.3  ALBUMIN  3.0* 2.8*   No results for input(s): LIPASE, AMYLASE in the last 168 hours. Recent Labs  Lab 07/31/24 0824  AMMONIA 258*    ABG    Component Value Date/Time   PHART 7.43 08/01/2024 0506   PCO2ART 33 08/01/2024 0506   PO2ART 129 (H) 08/01/2024 0506   HCO3 21.9 08/01/2024 0506   ACIDBASEDEF 1.7 08/01/2024 0506   O2SAT 99.8 08/01/2024 0506      Coagulation Profile: Recent Labs  Lab 07/31/24 0823  INR 1.6*    Cardiac Enzymes: No results for input(s): CKTOTAL, CKMB, CKMBINDEX, TROPONINI in the last 168 hours.  HbA1C: Hgb A1c MFr Bld  Date/Time Value Ref Range Status  09/30/2023 08:37 AM 4.9 4.8 - 5.6 % Final    Comment:    (NOTE) Pre diabetes:          5.7%-6.4%  Diabetes:              >6.4%  Glycemic control for   <7.0% adults with diabetes   06/27/2023 09:08 AM 5.0 4.8 - 5.6 % Final    Comment:    (NOTE) Pre diabetes:          5.7%-6.4%  Diabetes:              >6.4%  Glycemic control for   <7.0% adults with diabetes     CBG: Recent Labs  Lab 08/01/24 0003 08/01/24 0449 08/01/24 0752 08/01/24 1154 08/01/24 1607  GLUCAP 85 82 73 96 72    Review of Systems:   Unable to assess due to AMS/intubation/sedation/critical illness    Past Medical History:  He,  has a past medical history of (HFpEF) heart failure with preserved ejection fraction (HCC), Anasarca, Anemia, Anxiety, Aortic atherosclerosis, Arthritis, Asthma, Bacteremia due to Escherichia coli (05/2023), Bacteremia due to Klebsiella pneumoniae (2023), Bilateral carotid artery disease, Bradycardia, CAD (coronary artery disease) (10/21/2006), CAD (coronary artery disease), Cervical radiculopathy, Chest pain, Chronic back pain, Chronic common bile duct dilatation, Cirrhosis (HCC), COPD (chronic obstructive pulmonary disease) (HCC), DDD (degenerative disc disease), lumbar, Depression, Frequent falls, GERD (gastroesophageal reflux disease), HBV (hepatitis B virus) infection, HCV (hepatitis C virus), Hernia of anterior abdominal wall, History of 2019 novel coronavirus disease (COVID-19) (04/16/2020), Homelessness, Hyperlipidemia, Hypertension, Infestation by bed bug (02/2022), Malingering, Migraine, Nephrolithiasis, Obesity, Portal hypertension (HCC), PTSD (post-traumatic stress disorder), Pulmonary HTN (HCC), Sleep apnea, Sleep apnea, Stenosis of  cervical spine with myelopathy (HCC), Suicidal ideations, T2DM (type 2 diabetes mellitus) (HCC), Thrombocytopenia, TIA (transient ischemic attack), and TIA (transient ischemic attack) (09/30/2023).   Surgical History:  Past Surgical History:  Procedure Laterality Date   ANTERIOR CERVICAL DECOMP/DISCECTOMY FUSION N/A 07/01/2023   Procedure: ANTERIOR CERVICAL DISCECTOMY AND FUSION (FORGE);  Surgeon: Clois Fret, MD;  Location: [facility] ORS;  Service: Neurosurgery;  Laterality: N/A;  Keep as last case of the day per Dr Clois   ANTERIOR CERVICAL DISCECTOMY     APPENDECTOMY     CARDIAC CATHETERIZATION     CHOLECYSTECTOMY     COLONOSCOPY N/A 04/01/2024   Procedure: COLONOSCOPY;  Surgeon: Toledo, Ladell POUR, MD;  Location: [facility] ENDOSCOPY;  Service: Gastroenterology;  Laterality: N/A;   ESOPHAGOGASTRODUODENOSCOPY N/A 04/01/2024   Procedure: EGD (ESOPHAGOGASTRODUODENOSCOPY);  Surgeon: Toledo, Ladell POUR, MD;  Location: [facility] ENDOSCOPY;  Service: Gastroenterology;  Laterality: N/A;   EYE SURGERY     INNER EAR SURGERY     IR CYSTOSTOMY TUBE PLACEMENT/BLADDER ASPIRATION  04/13/2024   NOSE SURGERY     POLYPECTOMY  04/01/2024   Procedure: POLYPECTOMY, INTESTINE;  Surgeon: Toledo, Ladell POUR, MD;  Location[facility];  Service: Gastroenterology;;   XI ROBOTIC LAPAROSCOPIC ASSISTED APPENDECTOMY N/A 11/05/2022   Procedure: XI ROBOTIC LAPAROSCOPIC ASSISTED APPENDECTOMY;  Surgeon: Rodolph Romano, MD;  Location: [facility] ORS;  Service: General;  Laterality: N/A;     Social History:   reports that he has been smoking cigarettes. He has never used smokeless tobacco. He reports that he does not currently use alcohol. He reports that he does not currently use drugs.   Family History:  His family history includes Heart disease in his mother; Heart failure in his maternal grandmother; Hypertension in his mother.   Allergies Allergies  Allergen Reactions   Bee Venom Anaphylaxis   Codeine  Itching     Only itching. Recently allergy tested at [facility] Pre-F Virus Vaccine Swelling   Meloxicam     Other Reaction(s): ?overdose of local given at dentist--had trouble breathing   Novocain [Procaine]    Sulfa Antibiotics     hallucinations     Home Medications  Prior to Admission medications   Medication Sig Start Date End Date Taking? Authorizing Provider  acetaminophen  (TYLENOL ) 650 MG CR tablet Take 650 mg by mouth every 8 (eight) hours as needed for pain.   Yes [provider]  albuterol  (PROVENTIL ) (2.5 MG/3ML) 0.083% nebulizer solution Take 2.5 mg by nebulization every 4 (four) hours as needed for wheezing or shortness of breath. 07/16/23  Yes [provider]  aspirin  81 MG chewable tablet Chew 81 mg by mouth daily.   Yes [provider]  atorvastatin  (LIPITOR) 40 MG tablet Take 40 mg by mouth daily.   Yes [provider]  citalopram  (CELEXA ) 20 MG tablet Take 20 mg by mouth daily.   Yes [provider]  feeding supplement (ENSURE ENLIVE / ENSURE PLUS) LIQD Take 237 mLs by mouth 3 (three) times daily between meals. 10/25/23  Yes Barbarann Nest, MD  furosemide  (LASIX ) 40 MG tablet Take 40 mg by mouth daily.   Yes [provider]  gabapentin  (NEURONTIN ) 300 MG capsule Take 300 mg by mouth 2 (two) times daily. 06/07/23  Yes [provider]  lactulose  (CHRONULAC ) 10 GM/15ML solution Take 67.5 mLs (45 g total) by mouth 3 (three) times daily. Please titrate to have 2-3 soft bowel movements daily 05/16/24  Yes Gordan Huxley, MD  methocarbamol  (ROBAXIN ) 500 MG tablet Take 500 mg by mouth 4 (four) times daily.   Yes [provider]  Multiple Vitamin (MULTIVITAMIN WITH MINERALS) TABS tablet Take 1 tablet by  mouth daily.   Yes [provider]  nitroGLYCERIN  (NITROLINGUAL ) 0.4 MG/SPRAY spray Place 1 spray under the tongue every 5 (five) minutes x 3 doses as needed for chest pain. 10/30/23  Yes Hackney, Ellouise A, FNP   ondansetron  (ZOFRAN ) 4 MG tablet Take 1 tablet (4 mg total) by mouth every 6 (six) hours as needed for nausea. 06/21/23  Yes Wieting, Richard, MD  ondansetron  (ZOFRAN -ODT) 8 MG disintegrating tablet Take 8 mg by mouth every 6 (six) hours as needed for nausea. 01/24/23  Yes [provider]  pantoprazole  (PROTONIX ) 40 MG tablet Take 40 mg by mouth daily. 09/24/23  Yes [provider]  phenol (CHLORASEPTIC) 1.4 % LIQD Use as directed 1 spray in the mouth or throat as needed for throat irritation / pain.   Yes [provider]  QUEtiapine  (SEROQUEL ) 50 MG tablet Take 50 mg by mouth at bedtime. 11/19/23  Yes [provider]  senna (SENOKOT) 8.6 MG TABS tablet Take 1 tablet (8.6 mg total) by mouth 2 (two) times daily as needed for mild constipation. 07/02/23  Yes Gregory Edsel Ruth, PA  spironolactone  (ALDACTONE ) 25 MG tablet Take 0.5 tablets (12.5 mg total) by mouth daily. 07/03/24 08/02/24 Yes Al-Sultani, Anmar, MD  VENTOLIN  HFA 108 (90 Base) MCG/ACT inhaler Inhale 2 puffs into the lungs every 4 (four) hours as needed for wheezing. 07/16/23  Yes [provider]  benzocaine  (ORAJEL) 10 % mucosal gel Apply to the tip of the penis for penile pain up to four times daily prn for penile pain Patient not taking: Reported on 07/31/2024 06/11/24   Helon Kirsch A, PA-C  cefdinir  (OMNICEF ) 300 MG capsule Take 1 capsule (300 mg total) by mouth 2 (two) times daily. Patient not taking: Reported on 07/28/2024 07/25/24   Viviann Pastor, MD  XIFAXAN  550 MG TABS tablet Take 550 mg by mouth 2 (two) times daily. Patient not taking: Reported on 07/31/2024 09/24/23   [provider]     Critical care provider statement:   Total critical care time: 33 minutes   Performed by: Parris MD   Critical care time was exclusive of separately billable procedures and treating other patients.   Critical care was necessary to treat or prevent imminent or life-threatening  deterioration.   Critical care was time spent personally by me on the following activities: development of treatment plan with patient and/or surrogate as well as nursing, discussions with consultants, evaluation of patient's response to treatment, examination of patient, obtaining history from patient or surrogate, ordering and performing treatments and interventions, ordering and review of laboratory studies, ordering and review of radiographic studies, pulse oximetry and re-evaluation of patient's condition.    Vernisha Bacote, M.D.  Pulmonary & Critical Care Medicine

## 2024-08-01 NOTE — Progress Notes (Addendum)
 PHARMACY CONSULT NOTE - ELECTROLYTES  Pharmacy Consult for Electrolyte Monitoring and Replacement   Recent Labs: Height: 5' 6 (167.6 cm) Weight: 95.9 kg (211 lb 6.7 oz) IBW/kg (Calculated) : 63.8 Estimated Creatinine Clearance: 53.2 mL/min (A) (by C-G formula based on SCr of 1.3 mg/dL (H)). Potassium (mmol/L)  Date Value  08/01/2024 4.3   Magnesium  (mg/dL)  Date Value  87/93/7974 2.0   Calcium  (mg/dL)  Date Value  87/93/7974 9.5   Albumin  (g/dL)  Date Value  87/93/7974 2.8 (L)  06/05/2018 3.3 (L)   Phosphorus (mg/dL)  Date Value  87/93/7974 2.4 (L)   Sodium (mmol/L)  Date Value  08/01/2024 140  01/31/2024 144    Assessment  [name] is a 73 y.o. male presenting with hepatic encephalopathy. PMH significant for HTN, T2DM, COPD, CHF, hepatic encephalopathy . Pharmacy has been consulted to monitor and replace electrolytes.  Diet: NPO MIVF: None  Pertinent medications: None  Goal of Therapy: Electrolytes WNL  Plan:  Will order Phos Nak packet per tube x 2 doses  No additional electrolyte replacement warranted at this time Check BMP, Mg, Phos with AM labs  Thank you for allowing pharmacy to be a part of this patient's care.   Estill CHRISTELLA Lutes, PharmD, BCPS Clinical Pharmacist 08/01/2024 7:05 AM

## 2024-08-01 NOTE — Progress Notes (Signed)
 Pt extubated to 2L [place], Per MD order, with no complications. Saturations are 100%.

## 2024-08-01 NOTE — Progress Notes (Signed)
 Patient passed wake up assessment and able to follow all commands; patient extubated to 2L [place] by RT as ordered by provider.  Patient tolerated well.

## 2024-08-01 NOTE — Plan of Care (Signed)
 Continuing with plan of care.

## 2024-08-01 NOTE — Progress Notes (Signed)
 Patient having consistent liquid stools, provider notified and ordered flexi-seal.  Flexi-seal placed and patient tolerated well.  Patient's B/P 174/73, provider notified.

## 2024-08-01 NOTE — Progress Notes (Signed)
 Pt transported to ICU 18 on the vent without incident. Pt tol well and remains on the vent. Report given to ICU RT.

## 2024-08-02 LAB — CBC
HCT: 27.8 % — ABNORMAL LOW (ref 39.0–52.0)
Hemoglobin: 8.9 g/dL — ABNORMAL LOW (ref 13.0–17.0)
MCH: 30.5 pg (ref 26.0–34.0)
MCHC: 32 g/dL (ref 30.0–36.0)
MCV: 95.2 fL (ref 80.0–100.0)
Platelets: 84 K/uL — ABNORMAL LOW (ref 150–400)
RBC: 2.92 MIL/uL — ABNORMAL LOW (ref 4.22–5.81)
RDW: 18.4 % — ABNORMAL HIGH (ref 11.5–15.5)
WBC: 5 K/uL (ref 4.0–10.5)
nRBC: 0 % (ref 0.0–0.2)

## 2024-08-02 LAB — COMPREHENSIVE METABOLIC PANEL WITH GFR
ALT: 17 U/L (ref 0–44)
AST: 32 U/L (ref 15–41)
Albumin: 2.6 g/dL — ABNORMAL LOW (ref 3.5–5.0)
Alkaline Phosphatase: 69 U/L (ref 38–126)
Anion gap: 9 (ref 5–15)
BUN: 18 mg/dL (ref 8–23)
CO2: 22 mmol/L (ref 22–32)
Calcium: 9.4 mg/dL (ref 8.9–10.3)
Chloride: 112 mmol/L — ABNORMAL HIGH (ref 98–111)
Creatinine, Ser: 1.29 mg/dL — ABNORMAL HIGH (ref 0.61–1.24)
GFR, Estimated: 58 mL/min — ABNORMAL LOW (ref >60)
Glucose, Bld: 73 mg/dL (ref 70–99)
Potassium: 3.8 mmol/L (ref 3.5–5.1)
Sodium: 143 mmol/L (ref 135–145)
Total Bilirubin: 1.6 mg/dL — ABNORMAL HIGH (ref 0.0–1.2)
Total Protein: 6.3 g/dL — ABNORMAL LOW (ref 6.5–8.1)

## 2024-08-02 LAB — AMMONIA: Ammonia: 39 umol/L — ABNORMAL HIGH (ref 9–35)

## 2024-08-02 LAB — GLUCOSE, CAPILLARY
Glucose-Capillary: 117 mg/dL — ABNORMAL HIGH (ref 70–99)
Glucose-Capillary: 121 mg/dL — ABNORMAL HIGH (ref 70–99)
Glucose-Capillary: 127 mg/dL — ABNORMAL HIGH (ref 70–99)
Glucose-Capillary: 56 mg/dL — ABNORMAL LOW (ref 70–99)
Glucose-Capillary: 73 mg/dL (ref 70–99)
Glucose-Capillary: 73 mg/dL (ref 70–99)
Glucose-Capillary: 91 mg/dL (ref 70–99)
Glucose-Capillary: 99 mg/dL (ref 70–99)

## 2024-08-02 LAB — PHOSPHORUS: Phosphorus: 3.1 mg/dL (ref 2.5–4.6)

## 2024-08-02 LAB — MAGNESIUM: Magnesium: 1.7 mg/dL (ref 1.7–2.4)

## 2024-08-02 MED ORDER — LACTULOSE 10 GM/15ML PO SOLN
45.0000 g | Freq: Three times a day (TID) | ORAL | Status: DC
  Administered 2024-08-02: 45 g via ORAL
  Filled 2024-08-02 (×2): qty 90

## 2024-08-02 MED ORDER — ATORVASTATIN CALCIUM 20 MG PO TABS
40.0000 mg | ORAL_TABLET | Freq: Every day | ORAL | Status: DC
  Administered 2024-08-02 – 2024-08-05 (×4): 40 mg via ORAL
  Filled 2024-08-02 (×4): qty 2

## 2024-08-02 MED ORDER — METHOCARBAMOL 500 MG PO TABS
500.0000 mg | ORAL_TABLET | Freq: Two times a day (BID) | ORAL | Status: DC
  Administered 2024-08-02 – 2024-08-06 (×8): 500 mg via ORAL
  Filled 2024-08-02 (×9): qty 1

## 2024-08-02 MED ORDER — CITALOPRAM HYDROBROMIDE 20 MG PO TABS
20.0000 mg | ORAL_TABLET | Freq: Every day | ORAL | Status: DC
  Administered 2024-08-02 – 2024-08-06 (×5): 20 mg via ORAL
  Filled 2024-08-02 (×5): qty 1

## 2024-08-02 MED ORDER — DEXTROSE 10 % IV SOLN: INTRAVENOUS | Status: DC

## 2024-08-02 MED ORDER — QUETIAPINE FUMARATE 25 MG PO TABS
50.0000 mg | ORAL_TABLET | Freq: Every day | ORAL | Status: DC
  Administered 2024-08-02 – 2024-08-05 (×4): 50 mg via ORAL
  Filled 2024-08-02 (×4): qty 2

## 2024-08-02 MED ORDER — MAGNESIUM SULFATE 2 GM/50ML IV SOLN
2.0000 g | Freq: Once | INTRAVENOUS | Status: AC
  Administered 2024-08-02: 2 g via INTRAVENOUS
  Filled 2024-08-02: qty 50

## 2024-08-02 NOTE — Evaluation (Signed)
 Clinical/Bedside Swallow Evaluation Patient Details  Name: [name] MRN: [id number] Date of Birth: 1951/07/31  Today's Date: 08/02/2024 Time: SLP Start Time (ACUTE ONLY): 1015 SLP Stop Time (ACUTE ONLY): 1115 SLP Time Calculation (min) (ACUTE ONLY): 60 min  Past Medical History:  Past Medical History:  Diagnosis Date   (HFpEF) heart failure with preserved ejection fraction (HCC)    a.) TTE 03/20/2018: EF 60-65%, no RWMAs, mild LAE, mild-mod MR, PASP 42; b.) TTE 03/31/2021: EF 55-60%, no RWMAs, mild LVH, mild LAE, AoV sclerosis without stenosis; c.) TTE 03/25/2022: EF 60-65%, no RWMAs, AoV sclerosis without stenosis; d.) TTE 12/24/2022: EF 60-65%, no RWMAs, mild LVH, mild LAE.   Anasarca    Anemia    Anxiety    Aortic atherosclerosis    Arthritis    Asthma    Bacteremia due to Escherichia coli 05/2023   Bacteremia due to Klebsiella pneumoniae 2023   Bilateral carotid artery disease    Bradycardia    CAD (coronary artery disease) 10/21/2006   a.) MV 10/21/2006: small reversible defect involving the apical  lateral wall c/w possible ischemia; b.) MV: 03/24/2018: no ischemia; c.) MV 10/04/2021: LAD calcifications   CAD (coronary artery disease)    Cervical radiculopathy    Chest pain    a.) MV 03/21/2018: no ischemia; b.) MV 10/04/2021: no ischemia   Chronic back pain    Chronic common bile duct dilatation    Cirrhosis (HCC)    a.) (+) hepatic lesion on CT; likely hepatocellular carcinoma; b.) CT evidence of associated portal hypertension; c.) (+) abdominopelvic anasarca   COPD (chronic obstructive pulmonary disease) (HCC)    DDD (degenerative disc disease), lumbar    Depression    Frequent falls    GERD (gastroesophageal reflux disease)    HBV (hepatitis B virus) infection    HCV (hepatitis C virus)    Hernia of anterior abdominal wall    History of 2019 novel coronavirus disease (COVID-19) 04/16/2020   Homelessness    a.) limited on placement as patient is listed on [place]  sex offender listing for exploitation of a minor (possession of child pornography); incarcerated 12 years (released 11/2017)   Hyperlipidemia    Hypertension    Infestation by bed bug 02/2022   Malingering    Migraine    Nephrolithiasis    Obesity    Portal hypertension (HCC)    PTSD (post-traumatic stress disorder)    a.) brief training in the [employer] at age 2; someone was killed in front of him   Pulmonary HTN (HCC)    a.) multiple CT scans desmontrating dilated PA c/w pHTN; b.) TTE 03/20/2018: PASP 42 mmHg   Sleep apnea    a.) no nocturnal PAP therapy   Sleep apnea    Stenosis of cervical spine with myelopathy (HCC)    Suicidal ideations    T2DM (type 2 diabetes mellitus) (HCC)    Thrombocytopenia    a.) related to hepatic cirrhosis; spleen normal in size   TIA (transient ischemic attack)    TIA (transient ischemic attack) 09/30/2023   Past Surgical History:  Past Surgical History:  Procedure Laterality Date   ANTERIOR CERVICAL DECOMP/DISCECTOMY FUSION N/A 07/01/2023   Procedure: ANTERIOR CERVICAL DISCECTOMY AND FUSION (FORGE);  Surgeon: Clois Fret, MD;  Location: [facility];  Service: Neurosurgery;  Laterality: N/A;  Keep as last case of the day per Dr Clois   ANTERIOR CERVICAL DISCECTOMY     APPENDECTOMY     CARDIAC CATHETERIZATION  CHOLECYSTECTOMY     COLONOSCOPY N/A 04/01/2024   Procedure: COLONOSCOPY;  Surgeon: Toledo, Ladell POUR, MD;  Location: [facility];  Service: Gastroenterology;  Laterality: N/A;   ESOPHAGOGASTRODUODENOSCOPY N/A 04/01/2024   Procedure: EGD (ESOPHAGOGASTRODUODENOSCOPY);  Surgeon: Toledo, Ladell POUR, MD;  Location: [facility];  Service: Gastroenterology;  Laterality: N/A;   EYE SURGERY     INNER EAR SURGERY     IR CYSTOSTOMY TUBE PLACEMENT/BLADDER ASPIRATION  04/13/2024   NOSE SURGERY     POLYPECTOMY  04/01/2024   Procedure: POLYPECTOMY, INTESTINE;  Surgeon: Toledo, Ladell POUR, MD;  Location: [facility];  Service: Gastroenterology;;    XI ROBOTIC LAPAROSCOPIC ASSISTED APPENDECTOMY N/A 11/05/2022   Procedure: XI ROBOTIC LAPAROSCOPIC ASSISTED APPENDECTOMY;  Surgeon: Rodolph Romano, MD;  Location: [facility];  Service: General;  Laterality: N/A;   HPI:  Pt is a 73 y/o male with a history of MULTIPLE visits to the Hospital/ED and MULTIPLE medical dxs including T2DM, HTN, COPD, migraines, hepatitis C, w/ likely underlying HCC, HBV, Cirrhosis, TIA, depression, anxiety, PTSD, Hernia, GERD, obstructive sleep apnea, cervical stenosis status post C3-6 ACDF, hyperlipidemia, CKD, chronic foley, CAD, and chronic Heart Failure.  He has had multiple ED visits mostly due to urology complaints.  Recent Discharge summary from 07/03/2024.  Patient presented with fever and lethargy: found to have sepsis secondary to bacteremia from a Klebsiella UTI in the setting of an indwelling Foley.  Was also noted to have acute hepatic encephalopathy that improved with lactulose  and rifaximin ..  At this admit from his Group Home where he was found unresponsive, there was concern for hepatic encephalopathy and sepsis d/t UTI.  Patient did have worsening mental status after returning from CT and decision was made to proceed with intubation as above.  He was placed on propofol  for postintubation sedation.  Pt was extubated on 12/6, ~1 day later.  Chest Imaging this admit:  No focal airspace consolidation, pleural effusion, or pneumothorax.  Mild cardiomegaly. Tortuous aorta with aortic atherosclerosis. No  acute fracture or destructive lesions. Multilevel thoracic  osteophytosis. Cervical fusion hardware.  Head CT: No acute traumatic injury identified.  2. Stable and normal for age non-contrast CT appearance of the brain.    Assessment / Plan / Recommendation  Clinical Impression   Pt seen for BSE today. Pt awake, verbally responded and followed few basic 1step directions. Pt w/ mumbled speech but intelligible - Edentulous. Pt required support w/ feeding of po's.  Unsure of pt's Baseline Cognitive functioning. Able to answer a few direct, basic questions re: self. Orientation poor. Noted per pt's chart history, he has had MULTIPLE admits in recent months. Pt appears Deconditioned. On RA, afebrile. WBC low.   Pt appears to present w/ grossly functional oropharyngeal phase swallowing w/ the trial consistencies assessed - no thin liquids nor solids given this assessment d/t recent intubation/extubation and overall deconditioned status. Pt required Feeding Support and is Edentulous at Baseline. No overt pharyngeal phase swallowing deficits w/ consistencies given. ANY Cognitive decline can impact overall awareness/timing of swallow and safety during po tasks which increases risk for aspiration, choking.  Pt's risk for aspiration appears reduced when following general aspiration precautions including feeding support and when using a modified diet consistency of Puree foods w/ Nectar liquids.      Pt assisted into and Upright position for po trials -- he needed MOD+ support. Pt requested feeding support. He consumed trials of Nectar liquids via Pinched straw(~8+ ozs total) and trials of puree foods moistened well w/ No  overt clinical s/s of aspiration noted: no decline in vocal quality, no cough, and no decline in respiratory status during/post trials. O2 sats 99% during. Oral phase was adequate for bolus management, mashing/gumming, and oral clearing of the boluses given. Educated on full oral clearing b/t bites, need to SLOW DOWN and clear mouth b/t each bite. Pt followed instruct4ions w/ fair-good accuracy. Suspect decreased insight into risk for aspiration when eating/drinking Impulsively. OM exam revealed no unilateral oral weakness.   In setting of overall medical presentation today, Edentulous status, and previously recommended oral diet consistencies at admits, recommend initiation of po intake using the dysphagia level 1(Puree foods) moistened for ease and  flavor; Nectar liquids via Small sips Slowly- Pinched straw as needed. General aspiration precautions; reduce Distractions during meals and engage pt during meals for self-feeding. Monitor for Impulsive feeding behaviors and oral clearing b/t bites. Pills Whole vs Crushed vs whole in Puree for safer swallowing. Support/Supervision at meals; monitoring for needs. MD/NSG updated.    Recommend follow w/ Palliative Care and Dietician for support. ST services will f/u w/ ongoing assessment of swallowing and trials to upgrade diet as appropriate. Suspect pt is close to/at his baseline. Education provided to pt/NSG re: diet consistency and precautions. Precautions posted in room, chart. SLP Visit Diagnosis: Dysphagia, oropharyngeal phase (R13.12) (baseline Dysphagia previously; Edentulous; deconditioning/illness/hospitalization; need for support w/ feeding)    Aspiration Risk  Mild aspiration risk;Risk for inadequate nutrition/hydration (reduced followin general aspiration precautions)    Diet Recommendation   Nectar;Dysphagia 1 (puree) = ysphagia level 1(Puree foods) moistened for ease and flavor; Nectar liquids via Small sips Slowly- Pinched straw as needed. General aspiration precautions; reduce Distractions during meals and engage pt during meals for self-feeding. Monitor for Impulsive feeding behaviors and oral clearing b/t bites. Support/Supervision at meals; monitoring for needs.   Medication Administration: Whole meds with puree (vs CRUSHED in Puree)    Other Recommendations Recommended Consults:  (Dietician f/u) Oral Care Recommendations: Oral care BID;Staff/trained caregiver to provide oral care Caregiver Recommendations: Avoid jello, ice cream, thin soups, popsicles;Remove water pitcher;Have oral suction available     Swallow Evaluation Recommendations Caregiver Recommendations: Avoid jello, ice cream, thin soups, popsicles;Remove water pitcher;Have oral suction available   Assistance  Recommended at Discharge  FULL currently  Functional Status Assessment Patient has had a recent decline in their functional status and demonstrates the ability to make significant improvements in function in a reasonable and predictable amount of time.  Frequency and Duration min 2x/week  2 weeks       Prognosis Prognosis for improved oropharyngeal function: Fair (-Good) Barriers to Reach Goals: Time post onset;Severity of deficits Barriers/Prognosis Comment: baseline Dysphagia previously; Edentulous; deconditioning/illness/hospitalization; need for support w/ feeding      Swallow Study   General Date of Onset: 07/31/24 HPI: Pt is a 73 y/o male with a history of MULTIPLE visits to the Hospital/ED and MULTIPLE medical dxs including T2DM, HTN, COPD, migraines, hepatitis C, w/ likely underlying HCC, HBV, Cirrhosis, TIA, depression, anxiety, PTSD, Hernia, GERD, obstructive sleep apnea, cervical stenosis status post C3-6 ACDF, hyperlipidemia, CKD, chronic foley, CAD, and chronic Heart Failure.  He has had multiple ED visits mostly due to urology complaints.  Recent Discharge summary from 07/03/2024.  Patient presented with fever and lethargy: found to have sepsis secondary to bacteremia from a Klebsiella UTI in the setting of an indwelling Foley.  Was also noted to have acute hepatic encephalopathy that improved with lactulose  and rifaximin ..  At this admit from  his Group Home where he was found unresponsive, there was concern for hepatic encephalopathy and sepsis d/t UTI.  Patient did have worsening mental status after returning from CT and decision was made to proceed with intubation as above.  He was placed on propofol  for postintubation sedation.  Pt was extubated on 12/6, ~1 day later.  Chest Imaging this admit:  No focal airspace consolidation, pleural effusion, or pneumothorax.  Mild cardiomegaly. Tortuous aorta with aortic atherosclerosis. No  acute fracture or destructive lesions. Multilevel  thoracic  osteophytosis. Cervical fusion hardware.  Head CT: No acute traumatic injury identified.  2. Stable and normal for age non-contrast CT appearance of the brain. Type of Study: Bedside Swallow Evaluation Previous Swallow Assessment: 2025(dys 2 w/ thins); 2024 and 2023(dys 1 w/ thins) Diet Prior to this Study: NPO Temperature Spikes Noted: No (wbc 5.0) Respiratory Status: Nasal cannula (2L) History of Recent Intubation: Yes Total duration of intubation (days): 1 days Date extubated: 08/01/24 Behavior/Cognition: Alert;Cooperative;Pleasant mood;Distractible;Requires cueing (unsure of Baseline Cognitive function/status) Oral Cavity Assessment: Within Functional Limits Oral Care Completed by SLP: Yes Oral Cavity - Dentition: Edentulous Vision:  (n/a) Self-Feeding Abilities: Total assist Patient Positioning: Upright in bed (full positioning support) Baseline Vocal Quality: Normal Volitional Cough: Strong Volitional Swallow: Able to elicit    Oral/Motor/Sensory Function Overall Oral Motor/Sensory Function: Within functional limits (no unilateral weakness noted)   Ice Chips Ice chips: Not tested   Thin Liquid Thin Liquid: Not tested    Nectar Thick Nectar Thick Liquid: Within functional limits Presentation: Spoon;Straw (fed and straw monitored for smaller sips slowly; ~8 ozs total)   Honey Thick Honey Thick Liquid: Not tested   Puree Puree: Within functional limits Presentation: Spoon (fed; 8 ozs total)   Solid     Solid: Not tested        [name], MS, CCC-SLP Speech Language Pathologist Rehab Services; [facility] [place] [phone] (ascom) [name] Gerrard 08/02/2024,1:12 PM

## 2024-08-02 NOTE — Progress Notes (Signed)
 PHARMACY CONSULT NOTE - ELECTROLYTES  Pharmacy Consult for Electrolyte Monitoring and Replacement   Recent Labs: Height: 5' 6 (167.6 cm) Weight: 95.2 kg (209 lb 14.1 oz) IBW/kg (Calculated) : 63.8 Estimated Creatinine Clearance: 53.5 mL/min (A) (by C-G formula based on SCr of 1.29 mg/dL (H)). Potassium (mmol/L)  Date Value  08/02/2024 3.8   Magnesium  (mg/dL)  Date Value  87/92/7974 1.7   Calcium  (mg/dL)  Date Value  87/92/7974 9.4   Albumin  (g/dL)  Date Value  87/92/7974 2.6 (L)  06/05/2018 3.3 (L)   Phosphorus (mg/dL)  Date Value  87/92/7974 3.1   Sodium (mmol/L)  Date Value  08/02/2024 143  01/31/2024 144    Assessment  [name] is a 73 y.o. male presenting with hepatic encephalopathy. PMH significant for HTN, T2DM, COPD, CHF, hepatic encephalopathy . Pharmacy has been consulted to monitor and replace electrolytes.  Diet: NPO MIVF: None  Pertinent medications: None  Goal of Therapy: Electrolytes WNL  Plan:  Mag 1.7- Will order Magnesium  2g IV  No additional electrolyte replacement warranted at this time Check BMP, Mg, Phos with AM labs  Thank you for allowing pharmacy to be a part of this patient's care.   Estill CHRISTELLA Lutes, PharmD, BCPS Clinical Pharmacist 08/02/2024 7:12 AM

## 2024-08-02 NOTE — Progress Notes (Signed)
 NAME:  JOHNHENRY TIPPIN, MRN:  [id number], DOB:  1951/04/12, LOS: 2 ADMISSION DATE:  07/31/2024, CONSULTATION DATE:  07/31/2024 REFERRING MD:  Dr. Levander, CHIEF COMPLAINT:  Altered Mental Status   Brief Pt Description / Synopsis:  73 year old male admitted with hepatic encephalopathy requiring intubation for airway protection, along with concern for UTI and enteritis.  History of Present Illness:  Jahquez Steffler is a 73 year old male with a past medical history significant for hepatic encephalopathy, COPD, CHF, hypertension, type 2 diabetes mellitus who presented to [facility] ED on 07/31/2024 for evaluation of altered mental status.  Patient is currently intubated and sedated unable to contribute to history, no family currently available, therefore history obtained from chart review.  It is reported that he was sent from his group home after being found unresponsive, with his last known well time occurring when he went to bed last night.  EMS reported some response to painful stimuli.  Of note he was recently admitted on 07/03/2024 after presenting with fever and lethargy.  He was found to be septic due to bacteremia from Klebsiella UTI in the setting of an indwelling Foley catheter along with acute hepatic encephalopathy that improved with lactulose  and rifaximin .   ED Course: Initial Vital Signs: Temperature 96.6 F rectally, pulse 66, respiratory rate 13, blood pressure 151/67, SpO2 100% on room air Significant Labs: WBC 3.9, hemoglobin 10.1, platelets 118, lactic acid 3.8, INR 1.6, PT 20.1, glucose 131, creatinine 1.4, BUN 19 Urinalysis consistent with UTI Imaging Chest X-ray>>IMPRESSION: Mild cardiomegaly. No pneumonia or pulmonary edema. CT Head wo contrast>>IMPRESSION: 1. No acute traumatic injury identified. 2. Stable and normal for age non-contrast CT appearance of the brain. CT Abdomen & Pelvis wo contrast>>IMPRESSION: 1. Fluid containing nondilated small and large bowel suggesting  acute enteritis/diarrhea. 2. Foley catheter with no adverse features. Bladder wall thickening and mild inflammation, but decreased from 04/14/2024. 3. Cirrhosis with mild splenomegaly. Chronic renal atrophy and nephrolithiasis. 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 Lung base atelectasis. Medications Administered: IV cefepime   Initially he was managing his secretions, however after returning from CT he had worsening mental status with snoring respirations requiring intubation for airway protection.  PCCM asked to admit for further workup and treatment.  Please see Significant Hospital Events section below for full detailed hospital course.  08/01/24- patient has been liberated from mechanical ventilation. Patient is asking for ativan  and narcotics post extubation.  08/02/24- patient with no overnight events, discussed in multidisciplinary rounds, meds refined and patient optimized for TRH.   Pertinent  Medical History   Past Medical History:  Diagnosis Date   (HFpEF) heart failure with preserved ejection fraction (HCC)    a.) TTE 03/20/2018: EF 60-65%, no RWMAs, mild LAE, mild-mod MR, PASP 42; b.) TTE 03/31/2021: EF 55-60%, no RWMAs, mild LVH, mild LAE, AoV sclerosis without stenosis; c.) TTE 03/25/2022: EF 60-65%, no RWMAs, AoV sclerosis without stenosis; d.) TTE 12/24/2022: EF 60-65%, no RWMAs, mild LVH, mild LAE.   Anasarca    Anemia    Anxiety    Aortic atherosclerosis    Arthritis    Asthma    Bacteremia due to Escherichia coli 05/2023   Bacteremia due to Klebsiella pneumoniae 2023   Bilateral carotid artery disease    Bradycardia    CAD (coronary artery disease) 10/21/2006   a.) MV 10/21/2006: small reversible defect involving the apical  lateral wall c/w possible ischemia; b.) MV: 03/24/2018: no ischemia; c.) MV 10/04/2021: LAD calcifications  CAD (coronary artery disease)    Cervical radiculopathy    Chest pain    a.) MV  03/21/2018: no ischemia; b.) MV 10/04/2021: no ischemia   Chronic back pain    Chronic common bile duct dilatation    Cirrhosis (HCC)    a.) (+) hepatic lesion on CT; likely hepatocellular carcinoma; b.) CT evidence of associated portal hypertension; c.) (+) abdominopelvic anasarca   COPD (chronic obstructive pulmonary disease) (HCC)    DDD (degenerative disc disease), lumbar    Depression    Frequent falls    GERD (gastroesophageal reflux disease)    HBV (hepatitis B virus) infection    HCV (hepatitis C virus)    Hernia of anterior abdominal wall    History of 2019 novel coronavirus disease (COVID-19) 04/16/2020   Homelessness    a.) limited on placement as patient is listed on [place] sex offender listing for exploitation of a minor (possession of child pornography); incarcerated 12 years (released 11/2017)   Hyperlipidemia    Hypertension    Infestation by bed bug 02/2022   Malingering    Migraine    Nephrolithiasis    Obesity    Portal hypertension (HCC)    PTSD (post-traumatic stress disorder)    a.) brief training in the [employer] at age 62; someone was killed in front of him   Pulmonary HTN (HCC)    a.) multiple CT scans desmontrating dilated PA c/w pHTN; b.) TTE 03/20/2018: PASP 42 mmHg   Sleep apnea    a.) no nocturnal PAP therapy   Sleep apnea    Stenosis of cervical spine with myelopathy (HCC)    Suicidal ideations    T2DM (type 2 diabetes mellitus) (HCC)    Thrombocytopenia    a.) related to hepatic cirrhosis; spleen normal in size   TIA (transient ischemic attack)    TIA (transient ischemic attack) 09/30/2023    Micro Data:  12/5: Blood cultures x 2>> 12/5: Urine>> 12/5: MRSA PCR negative  Antimicrobials:   Anti-infectives (From admission, onward)    Start     Dose/Rate Route Frequency Ordered Stop   08/01/24 2200  rifaximin  (XIFAXAN ) tablet 550 mg        550 mg Oral 2 times daily 08/01/24 1039     08/01/24 1345  meropenem  (MERREM ) 1 g in sodium chloride  0.9 %  100 mL IVPB        1 g 200 mL/hr over 30 Minutes Intravenous Every 8 hours 08/01/24 1252     07/31/24 2000  cefTRIAXone  (ROCEPHIN ) 2 g in sodium chloride  0.9 % 899 mL IVPB  Status:  Discontinued        2 g 200 mL/hr over 30 Minutes Intravenous Every 24 hours 07/31/24 1227 08/01/24 1252   07/31/24 1400  rifaximin  (XIFAXAN ) tablet 550 mg  Status:  Discontinued        550 mg Per Tube 2 times daily 07/31/24 1227 08/01/24 1039   07/31/24 0930  ceFEPIme  (MAXIPIME ) 2 g in sodium chloride  0.9 % 100 mL IVPB        2 g 200 mL/hr over 30 Minutes Intravenous  Once 07/31/24 0929 07/31/24 1025       Significant Hospital Events: Including procedures, antibiotic start and stop dates in addition to other pertinent events   12/5: Presented to ED with altered mental status.  Concern for UTI and questionable enteritis.  Ultimately required intubation for airway protection.  PCCM asked to admit.  Interim History / Subjective:  As outlined  above under Significant Hospital Events section  Objective   Blood pressure 110/64, pulse (!) 50, temperature 98.3 F (36.8 C), temperature source Oral, resp. rate 12, height 5' 6 (1.676 m), weight 95.2 kg, SpO2 100%.        Intake/Output Summary (Last 24 hours) at 08/02/2024 1034 Last data filed at 08/02/2024 0600 Gross per 24 hour  Intake 200.07 ml  Output 1700 ml  Net -1499.93 ml   Filed Weights   07/31/24 0823 08/01/24 0400 08/02/24 0500  Weight: 97.5 kg 95.9 kg 95.2 kg    Examination: General: Age appropriate NAD HENT: Atraumatic, normocephalic, neck supple, no JVD, orally intubated Lungs: mild rhonchi bilatearlly Cardiovascular: Regular rate and rhythm, S1-S2, no murmurs, rubs, gallops Abdomen: Soft, nontender, nondistended, no guarding or tenderness, bowel sounds positive x4 Extremities: Normal bulk and tone, no deformities, no edema, no cyanosis, good peripheral perfusion Neuro:no FND grossly GU: Foley catheter in place draining yellow  urine    Assessment & Plan:   #Hepatic Encephalopathy #Sedation needs in setting of mechanical ventilation PMHx: TIA, suicidal ideation, PTSD CT Head negative for acute intracranial abnormality -Check UDS  #Intubated for Airway Protection -s/p extubation 08/01/24 #COPD without acute exacerbation  PMHx: COPD, asthma, pulmonary htn, sleep apnea  -Full vent support, implement lung protective strategies -Plateau pressures less than 30 cm H20 -Wean FiO2 & PEEP as tolerated to maintain O2 sats 88 to 92% -Follow intermittent Chest X-ray & ABG as needed -Spontaneous Breathing Trials when respiratory parameters met and mental status permits -Implement VAP Bundle -Prn Bronchodilators  #UTI #Concern for Enteritis #Low suspicion for SBP  PMHx: recent Klebsiella/Proteus/Enterococcus/Citrobacter/Myroides UTI in Nov. 2025 CT Abdomen without ascites -Monitor fever curve -Trend WBC's & Procalcitonin -Follow cultures as above -Continue empiric Ceftriaxone  pending cultures & sensitivities  #Cirrhosis PMHx: HBV/HCV -Trend LFT's and coags -Continue Lactulose  and Rifaxamin  #HFpEF without acute exacerbation PMHx: CAD, HTN, HLD Echocardiogram 06/30/24: LVEF 55 to 60%, normal diastolic parameters, RV systolic function normal -Continuous cardiac monitoring -Maintain MAP >65 -Cautious IV fluids -Vasopressors as needed to maintain MAP goal ~ not requiring  -Diuresis as BP and renal function permits  #Anemia  #Thrombocytopenia PMHx: Anemia, thrombocytopenia -Monitor for S/Sx of bleeding -Trend CBC -Heparin  for VTE Prophylaxis  -Transfuse for Hgb <7 -Transfuse Platelets for Platelet count < 10K; < 50K with active bleeding; < 100K with Neurosurgical procedures/processes   #Diabetes Mellitus Type II -CBG's q4h; Target range of 140 to 180 -SSI -Follow ICU Hypo/Hyperglycemia protocol    Best Practice (right click and Reselect all SmartList Selections daily)   Diet/type: NPO DVT  prophylaxis: prophylactic heparin   GI prophylaxis: PPI Lines: N/A Foley:  Yes, and it is still needed Code Status:  full code Last date of multidisciplinary goals of care discussion [N/A]   Labs   CBC: Recent Labs  Lab 07/31/24 0823 08/01/24 0328 08/02/24 0512  WBC 3.9* 6.8 5.0  NEUTROABS 2.9  --   --   HGB 10.1* 10.4* 8.9*  HCT 31.6* 31.9* 27.8*  MCV 92.9 94.1 95.2  PLT 118* 93* 84*    Basic Metabolic Panel: Recent Labs  Lab 07/28/24 1313 07/31/24 0952 08/01/24 0204 08/02/24 0512  NA 137 138 140 143  K 4.4 4.5 4.3 3.8  CL 104 107 111 112*  CO2 26 21* 20* 22  GLUCOSE 120* 131* 79 73  BUN 22 19 19 18   CREATININE 1.55* 1.40* 1.30* 1.29*  CALCIUM  9.1 9.8 9.5 9.4  MG  --   --  2.0  1.7  PHOS  --   --  2.4* 3.1   GFR: Estimated Creatinine Clearance: 53.5 mL/min (A) (by C-G formula based on SCr of 1.29 mg/dL (H)). Recent Labs  Lab 07/31/24 0823 07/31/24 1315 08/01/24 0204 08/01/24 0328 08/01/24 1134 08/02/24 0512  WBC 3.9*  --   --  6.8  --  5.0  LATICACIDVEN 3.8* 3.4* 2.5*  --  1.3  --     Liver Function Tests: Recent Labs  Lab 07/31/24 0952 08/01/24 0204 08/02/24 0512  AST 34 38 32  ALT 18 16 17   ALKPHOS 79 75 69  BILITOT 1.3* 1.4* 1.6*  PROT 7.4 7.3 6.3*  ALBUMIN  3.0* 2.8* 2.6*   No results for input(s): LIPASE, AMYLASE in the last 168 hours. Recent Labs  Lab 07/31/24 0824 08/02/24 0512  AMMONIA 258* 39*    ABG    Component Value Date/Time   PHART 7.43 08/01/2024 0506   PCO2ART 33 08/01/2024 0506   PO2ART 129 (H) 08/01/2024 0506   HCO3 21.9 08/01/2024 0506   ACIDBASEDEF 1.7 08/01/2024 0506   O2SAT 99.8 08/01/2024 0506     Coagulation Profile: Recent Labs  Lab 07/31/24 0823  INR 1.6*    Cardiac Enzymes: No results for input(s): CKTOTAL, CKMB, CKMBINDEX, TROPONINI in the last 168 hours.  HbA1C: Hgb A1c MFr Bld  Date/Time Value Ref Range Status  09/30/2023 08:37 AM 4.9 4.8 - 5.6 % Final    Comment:     (NOTE) Pre diabetes:          5.7%-6.4%  Diabetes:              >6.4%  Glycemic control for   <7.0% adults with diabetes   06/27/2023 09:08 AM 5.0 4.8 - 5.6 % Final    Comment:    (NOTE) Pre diabetes:          5.7%-6.4%  Diabetes:              >6.4%  Glycemic control for   <7.0% adults with diabetes     CBG: Recent Labs  Lab 08/01/24 2012 08/01/24 2344 08/02/24 0018 08/02/24 0324 08/02/24 0754  GLUCAP 106* 56* 91 73 73    Review of Systems:   Unable to assess due to AMS/intubation/sedation/critical illness    Past Medical History:  He,  has a past medical history of (HFpEF) heart failure with preserved ejection fraction (HCC), Anasarca, Anemia, Anxiety, Aortic atherosclerosis, Arthritis, Asthma, Bacteremia due to Escherichia coli (05/2023), Bacteremia due to Klebsiella pneumoniae (2023), Bilateral carotid artery disease, Bradycardia, CAD (coronary artery disease) (10/21/2006), CAD (coronary artery disease), Cervical radiculopathy, Chest pain, Chronic back pain, Chronic common bile duct dilatation, Cirrhosis (HCC), COPD (chronic obstructive pulmonary disease) (HCC), DDD (degenerative disc disease), lumbar, Depression, Frequent falls, GERD (gastroesophageal reflux disease), HBV (hepatitis B virus) infection, HCV (hepatitis C virus), Hernia of anterior abdominal wall, History of 2019 novel coronavirus disease (COVID-19) (04/16/2020), Homelessness, Hyperlipidemia, Hypertension, Infestation by bed bug (02/2022), Malingering, Migraine, Nephrolithiasis, Obesity, Portal hypertension (HCC), PTSD (post-traumatic stress disorder), Pulmonary HTN (HCC), Sleep apnea, Sleep apnea, Stenosis of cervical spine with myelopathy (HCC), Suicidal ideations, T2DM (type 2 diabetes mellitus) (HCC), Thrombocytopenia, TIA (transient ischemic attack), and TIA (transient ischemic attack) (09/30/2023).   Surgical History:   Past Surgical History:  Procedure Laterality Date   ANTERIOR CERVICAL  DECOMP/DISCECTOMY FUSION N/A 07/01/2023   Procedure: ANTERIOR CERVICAL DISCECTOMY AND FUSION (FORGE);  Surgeon: Clois Fret, MD;  Location: [facility] ORS;  Service: Neurosurgery;  Laterality: N/A;  Keep  as last case of the day per Dr Clois   ANTERIOR CERVICAL DISCECTOMY     APPENDECTOMY     CARDIAC CATHETERIZATION     CHOLECYSTECTOMY     COLONOSCOPY N/A 04/01/2024   Procedure: COLONOSCOPY;  Surgeon: Toledo, Ladell POUR, MD;  Location: [facility] ENDOSCOPY;  Service: Gastroenterology;  Laterality: N/A;   ESOPHAGOGASTRODUODENOSCOPY N/A 04/01/2024   Procedure: EGD (ESOPHAGOGASTRODUODENOSCOPY);  Surgeon: Toledo, Ladell POUR, MD;  Location: [facility] ENDOSCOPY;  Service: Gastroenterology;  Laterality: N/A;   EYE SURGERY     INNER EAR SURGERY     IR CYSTOSTOMY TUBE PLACEMENT/BLADDER ASPIRATION  04/13/2024   NOSE SURGERY     POLYPECTOMY  04/01/2024   Procedure: POLYPECTOMY, INTESTINE;  Surgeon: Toledo, Ladell POUR, MD;  Location: [facility] ENDOSCOPY;  Service: Gastroenterology;;   XI ROBOTIC LAPAROSCOPIC ASSISTED APPENDECTOMY N/A 11/05/2022   Procedure: XI ROBOTIC LAPAROSCOPIC ASSISTED APPENDECTOMY;  Surgeon: Rodolph Romano, MD;  Location: [facility] ORS;  Service: General;  Laterality: N/A;     Social History:   reports that he has been smoking cigarettes. He has never used smokeless tobacco. He reports that he does not currently use alcohol. He reports that he does not currently use drugs.   Family History:  His family history includes Heart disease in his mother; Heart failure in his maternal grandmother; Hypertension in his mother.   Allergies Allergies  Allergen Reactions   Bee Venom Anaphylaxis   Codeine  Itching    Only itching. Recently allergy tested at [facility] Pre-F Virus Vaccine Swelling   Meloxicam     Other Reaction(s): ?overdose of local given at dentist--had trouble breathing   Novocain [Procaine]    Sulfa Antibiotics     hallucinations     Home Medications  Prior to Admission  medications   Medication Sig Start Date End Date Taking? Authorizing Provider  acetaminophen  (TYLENOL ) 650 MG CR tablet Take 650 mg by mouth every 8 (eight) hours as needed for pain.   Yes [provider]  albuterol  (PROVENTIL ) (2.5 MG/3ML) 0.083% nebulizer solution Take 2.5 mg by nebulization every 4 (four) hours as needed for wheezing or shortness of breath. 07/16/23  Yes [provider]  aspirin  81 MG chewable tablet Chew 81 mg by mouth daily.   Yes [provider]  atorvastatin  (LIPITOR) 40 MG tablet Take 40 mg by mouth daily.   Yes [provider]  citalopram  (CELEXA ) 20 MG tablet Take 20 mg by mouth daily.   Yes [provider]  feeding supplement (ENSURE ENLIVE / ENSURE PLUS) LIQD Take 237 mLs by mouth 3 (three) times daily between meals. 10/25/23  Yes Barbarann Nest, MD  furosemide  (LASIX ) 40 MG tablet Take 40 mg by mouth daily.   Yes [provider]  gabapentin  (NEURONTIN ) 300 MG capsule Take 300 mg by mouth 2 (two) times daily. 06/07/23  Yes [provider]  lactulose  (CHRONULAC ) 10 GM/15ML solution Take 67.5 mLs (45 g total) by mouth 3 (three) times daily. Please titrate to have 2-3 soft bowel movements daily 05/16/24  Yes Gordan Huxley, MD  methocarbamol  (ROBAXIN ) 500 MG tablet Take 500 mg by mouth 4 (four) times daily.   Yes [provider]  Multiple Vitamin (MULTIVITAMIN WITH MINERALS) TABS tablet Take 1 tablet by mouth daily.   Yes [provider]  nitroGLYCERIN  (NITROLINGUAL ) 0.4 MG/SPRAY spray Place 1 spray under the tongue every 5 (five) minutes x 3 doses as needed for chest pain. 10/30/23  Yes Donette Ellouise LABOR, FNP  ondansetron  (  ZOFRAN ) 4 MG tablet Take 1 tablet (4 mg total) by mouth every 6 (six) hours as needed for nausea. 06/21/23  Yes Wieting, Richard, MD  ondansetron  (ZOFRAN -ODT) 8 MG disintegrating tablet Take 8 mg by mouth every 6 (six) hours as needed for nausea. 01/24/23  Yes [provider]  pantoprazole  (PROTONIX ) 40 MG tablet Take 40 mg by mouth daily. 09/24/23  Yes [provider]  phenol (CHLORASEPTIC) 1.4 % LIQD Use as directed 1 spray in the mouth or throat as needed for throat irritation / pain.   Yes [provider]  QUEtiapine  (SEROQUEL ) 50 MG tablet Take 50 mg by mouth at bedtime. 11/19/23  Yes [provider]  senna (SENOKOT) 8.6 MG TABS tablet Take 1 tablet (8.6 mg total) by mouth 2 (two) times daily as needed for mild constipation. 07/02/23  Yes Gregory Edsel Ruth, PA  spironolactone  (ALDACTONE ) 25 MG tablet Take 0.5 tablets (12.5 mg total) by mouth daily. 07/03/24 08/02/24 Yes Al-Sultani, Anmar, MD  VENTOLIN  HFA 108 (90 Base) MCG/ACT inhaler Inhale 2 puffs into the lungs every 4 (four) hours as needed for wheezing. 07/16/23  Yes [provider]  benzocaine  (ORAJEL) 10 % mucosal gel Apply to the tip of the penis for penile pain up to four times daily prn for penile pain Patient not taking: Reported on 07/31/2024 06/11/24   Helon Kirsch A, PA-C  cefdinir  (OMNICEF ) 300 MG capsule Take 1 capsule (300 mg total) by mouth 2 (two) times daily. Patient not taking: Reported on 07/28/2024 07/25/24   Viviann Pastor, MD  XIFAXAN  550 MG TABS tablet Take 550 mg by mouth 2 (two) times daily. Patient not taking: Reported on 07/31/2024 09/24/23   [provider]     Critical care provider statement:   Total critical care time: 33 minutes   Performed by: Parris MD   Critical care time was exclusive of separately billable procedures and treating other patients.   Critical care was necessary to treat or prevent imminent or life-threatening deterioration.   Critical care was time spent personally by me on the following activities: development of treatment plan with patient and/or surrogate as well as nursing, discussions with consultants, evaluation of patient's response to treatment, examination of patient, obtaining history from  patient or surrogate, ordering and performing treatments and interventions, ordering and review of laboratory studies, ordering and review of radiographic studies, pulse oximetry and re-evaluation of patient's condition.    Kendric Sindelar, M.D.  Pulmonary & Critical Care Medicine

## 2024-08-02 NOTE — Plan of Care (Signed)
  Problem: Role Relationship: Goal: Method of communication will improve Outcome: Progressing   Problem: Clinical Measurements: Goal: Ability to maintain clinical measurements within normal limits will improve Outcome: Progressing Goal: Diagnostic test results will improve Outcome: Progressing   Problem: Pain Managment: Goal: General experience of comfort will improve and/or be controlled Outcome: Progressing   Problem: Safety: Goal: Ability to remain free from injury will improve Outcome: Progressing

## 2024-08-02 NOTE — Plan of Care (Signed)
 Continuing with plan of care.

## 2024-08-03 LAB — COMPREHENSIVE METABOLIC PANEL WITH GFR
ALT: 17 U/L (ref 0–44)
AST: 32 U/L (ref 15–41)
Albumin: 2.7 g/dL — ABNORMAL LOW (ref 3.5–5.0)
Alkaline Phosphatase: 77 U/L (ref 38–126)
Anion gap: 5 (ref 5–15)
BUN: 22 mg/dL (ref 8–23)
CO2: 24 mmol/L (ref 22–32)
Calcium: 10 mg/dL (ref 8.9–10.3)
Chloride: 113 mmol/L — ABNORMAL HIGH (ref 98–111)
Creatinine, Ser: 1.22 mg/dL (ref 0.61–1.24)
GFR, Estimated: >60 mL/min (ref >60)
Glucose, Bld: 91 mg/dL (ref 70–99)
Potassium: 4.3 mmol/L (ref 3.5–5.1)
Sodium: 142 mmol/L (ref 135–145)
Total Bilirubin: 1.1 mg/dL (ref 0.0–1.2)
Total Protein: 6.8 g/dL (ref 6.5–8.1)

## 2024-08-03 LAB — PHOSPHORUS: Phosphorus: 2.3 mg/dL — ABNORMAL LOW (ref 2.5–4.6)

## 2024-08-03 LAB — GLUCOSE, CAPILLARY
Glucose-Capillary: 127 mg/dL — ABNORMAL HIGH (ref 70–99)
Glucose-Capillary: 88 mg/dL (ref 70–99)
Glucose-Capillary: 176 mg/dL — ABNORMAL HIGH (ref 70–99)
Glucose-Capillary: 94 mg/dL (ref 70–99)

## 2024-08-03 LAB — CBC
HCT: 28.2 % — ABNORMAL LOW (ref 39.0–52.0)
Hemoglobin: 9.1 g/dL — ABNORMAL LOW (ref 13.0–17.0)
MCH: 30.7 pg (ref 26.0–34.0)
MCHC: 32.3 g/dL (ref 30.0–36.0)
MCV: 95.3 fL (ref 80.0–100.0)
Platelets: 76 K/uL — ABNORMAL LOW (ref 150–400)
RBC: 2.96 MIL/uL — ABNORMAL LOW (ref 4.22–5.81)
RDW: 17.9 % — ABNORMAL HIGH (ref 11.5–15.5)
WBC: 3.5 K/uL — ABNORMAL LOW (ref 4.0–10.5)
nRBC: 0 % (ref 0.0–0.2)

## 2024-08-03 LAB — MAGNESIUM: Magnesium: 2.1 mg/dL (ref 1.7–2.4)

## 2024-08-03 MED ORDER — ENOXAPARIN SODIUM 40 MG/0.4ML IJ SOSY
40.0000 mg | PREFILLED_SYRINGE | INTRAMUSCULAR | Status: DC
  Administered 2024-08-04 – 2024-08-05 (×3): 40 mg via SUBCUTANEOUS
  Filled 2024-08-03 (×3): qty 0.4

## 2024-08-03 MED ORDER — ASPIRIN 81 MG PO CHEW
81.0000 mg | CHEWABLE_TABLET | Freq: Every day | ORAL | Status: DC
  Administered 2024-08-03 – 2024-08-06 (×4): 81 mg via ORAL
  Filled 2024-08-03 (×4): qty 1

## 2024-08-03 MED ORDER — PANTOPRAZOLE SODIUM 40 MG PO TBEC
40.0000 mg | DELAYED_RELEASE_TABLET | Freq: Every day | ORAL | Status: DC
  Administered 2024-08-03 – 2024-08-06 (×4): 40 mg via ORAL
  Filled 2024-08-03 (×4): qty 1

## 2024-08-03 MED ORDER — FUROSEMIDE 40 MG PO TABS
40.0000 mg | ORAL_TABLET | Freq: Every day | ORAL | Status: DC
  Administered 2024-08-03 – 2024-08-06 (×4): 40 mg via ORAL
  Filled 2024-08-03 (×2): qty 1
  Filled 2024-08-03: qty 2
  Filled 2024-08-03: qty 1
  Filled 2024-08-03: qty 2

## 2024-08-03 MED ORDER — ACETAMINOPHEN 325 MG PO TABS
325.0000 mg | ORAL_TABLET | Freq: Four times a day (QID) | ORAL | Status: DC | PRN
  Administered 2024-08-04 – 2024-08-05 (×2): 325 mg via ORAL
  Filled 2024-08-03 (×3): qty 1

## 2024-08-03 MED ORDER — K PHOS MONO-SOD PHOS DI & MONO 155-852-130 MG PO TABS
500.0000 mg | ORAL_TABLET | Freq: Once | ORAL | Status: AC
  Administered 2024-08-03: 500 mg via ORAL
  Filled 2024-08-03 (×2): qty 2

## 2024-08-03 NOTE — Progress Notes (Signed)
 PHARMACY CONSULT NOTE - ELECTROLYTES  Pharmacy Consult for Electrolyte Monitoring and Replacement   Recent Labs: Height: 5' 6 (167.6 cm) Weight: 95.1 kg (209 lb 10.5 oz) IBW/kg (Calculated) : 63.8 Estimated Creatinine Clearance: 56.5 mL/min (by C-G formula based on SCr of 1.22 mg/dL). Potassium (mmol/L)  Date Value  08/03/2024 4.3   Magnesium  (mg/dL)  Date Value  87/91/7974 2.1   Calcium  (mg/dL)  Date Value  87/91/7974 10.0   Albumin  (g/dL)  Date Value  87/91/7974 2.7 (L)  06/05/2018 3.3 (L)   Phosphorus (mg/dL)  Date Value  87/91/7974 2.3 (L)   Sodium (mmol/L)  Date Value  08/03/2024 142  01/31/2024 144    Assessment  [name] is a 73 y.o. male presenting with hepatic encephalopathy. PMH significant for HTN, T2DM, COPD, CHF, hepatic encephalopathy . Pharmacy has been consulted to monitor and replace electrolytes.  Goal of Therapy: Electrolytes WNL  Plan:  500 mg po K-Phos neutral x 1 Because this consult was generated as part of an ICU order set and patient is transferring pharmacy will sign off for now Please feel free to reach out if any further assistance is needed  Thank you for allowing pharmacy to be a part of this patient's care.   Adriana JONETTA Bolster, PharmD, BCPS Clinical Pharmacist 08/03/2024 9:48 AM

## 2024-08-03 NOTE — Progress Notes (Signed)
 PROGRESS NOTE    [name]  FMW:[id number] DOB: 1951-04-17 DOA: 07/31/2024 PCP: [facility]     Brief Narrative:   [name] is a 73 year old male with a past medical history significant for hepatic encephalopathy, COPD, CHF, hypertension, type 2 diabetes mellitus who presented to [facility] ED on 07/31/2024 for evaluation of altered mental status.  Patient is currently intubated and sedated unable to contribute to history, no family currently available, therefore history obtained from chart review.   It is reported that he was sent from his group home after being found unresponsive, with his last known well time occurring when he went to bed last night.  EMS reported some response to painful stimuli.   Of note he was recently admitted on 07/03/2024 after presenting with fever and lethargy.  He was found to be septic due to bacteremia from Klebsiella UTI in the setting of an indwelling Foley catheter along with acute hepatic encephalopathy that improved with lactulose  and rifaximin .   Assessment & Plan:   Principal Problem:   Hepatic encephalopathy (HCC) Active Problems:   Liver cirrhosis (HCC)   Diabetes mellitus type 2, noninsulin dependent (HCC)   Essential hypertension   CAD (coronary artery disease)   Chronic kidney disease, stage 3a (HCC)   Chronic diastolic CHF (congestive heart failure) (HCC)   COPD (chronic obstructive pulmonary disease) (HCC)   History of hepatitis C   Obesity   Hepatitis B infection   Adrenal insufficiency   Chronic indwelling Foley catheter  # Hepatic encephalopathy # Cirrhosis S/p intubation for airway protection. Appears to be improving but remains encephalopathic. No ascites on 12/5 ct - d/c rectal tube - continue lactulose , titrate to 2-3 BMs/day - cont home rifaximin  - follow blood cultures - stop prn morphine , this is contributing to encephalopathy  # Dysphagia Slp following, cleared for dysphagia 1 diet  # Pyuria #  Chronic indwelling foley Patient discontinue foley while encephalopathic and it has been replaced. No fevers or overt signs of uti. Culture shows mixed species. Has been on ceftriaxone  here - d/c abx and monitor  # Neuropathy - holding home gabapentin  while encephalopathic  # CAD Asymptomatic - home asa, statin  # CKD 2/3a Cr 1.22 at baseline - monitor  # HFpEF Appears euvolemic, kidney function at baseline - resume home lasix   # MDD - home celexa , seroquel    DVT prophylaxis: lovenox  Code Status: full Family Communication: discussed with group home administrator, says DSS involved and is pursuing guardianship, has no real contacts. Will ask dss to reach out  Level of care: Med-Surg Status is: Inpatient Remains inpatient appropriate because: severity of illness    Consultants:  none  Procedures: S/p intubation  Antimicrobials:  S/p ceftriaxone     Subjective: confused  Objective: Vitals:   08/03/24 0800 08/03/24 0900 08/03/24 1000 08/03/24 1200  BP: 131/63 131/62 134/73   Pulse: (!) 52 (!) 52 61   Resp: 13 13 14    Temp: 98.9 F (37.2 C)   98.5 F (36.9 C)  TempSrc:      SpO2: 100% 100% 100%   Weight:      Height:        Intake/Output Summary (Last 24 hours) at 08/03/2024 1511 Last data filed at 08/03/2024 1000 Gross per 24 hour  Intake 423.31 ml  Output 1100 ml  Net -676.69 ml   Filed Weights   08/01/24 0400 08/02/24 0500 08/03/24 0500  Weight: 95.9 kg 95.2 kg 95.1 kg    Examination:  General exam: Appears calm and comfortable  Respiratory system: Clear to auscultation. Respiratory effort normal. Cardiovascular system: S1 & S2 heard, RRR.   Gastrointestinal system: Abdomen is nondistended, soft and nontender.   Central nervous system: Alert and oriented to self, moving all 4 Extremities: Symmetric 5 x 5 power. No edema Skin: No rashes, lesions or ulcers Psychiatry: confused    Data Reviewed: I have personally reviewed following labs  and imaging studies  CBC: Recent Labs  Lab 07/31/24 0823 08/01/24 0328 08/02/24 0512 08/03/24 0658  WBC 3.9* 6.8 5.0 3.5*  NEUTROABS 2.9  --   --   --   HGB 10.1* 10.4* 8.9* 9.1*  HCT 31.6* 31.9* 27.8* 28.2*  MCV 92.9 94.1 95.2 95.3  PLT 118* 93* 84* 76*   Basic Metabolic Panel: Recent Labs  Lab 07/28/24 1313 07/31/24 0952 08/01/24 0204 08/02/24 0512 08/03/24 0658  NA 137 138 140 143 142  K 4.4 4.5 4.3 3.8 4.3  CL 104 107 111 112* 113*  CO2 26 21* 20* 22 24  GLUCOSE 120* 131* 79 73 91  BUN 22 19 19 18 22   CREATININE 1.55* 1.40* 1.30* 1.29* 1.22  CALCIUM  9.1 9.8 9.5 9.4 10.0  MG  --   --  2.0 1.7 2.1  PHOS  --   --  2.4* 3.1 2.3*   GFR: Estimated Creatinine Clearance: 56.5 mL/min (by C-G formula based on SCr of 1.22 mg/dL). Liver Function Tests: Recent Labs  Lab 07/31/24 0952 08/01/24 0204 08/02/24 0512 08/03/24 0658  AST 34 38 32 32  ALT 18 16 17 17   ALKPHOS 79 75 69 77  BILITOT 1.3* 1.4* 1.6* 1.1  PROT 7.4 7.3 6.3* 6.8  ALBUMIN  3.0* 2.8* 2.6* 2.7*   No results for input(s): LIPASE, AMYLASE in the last 168 hours. Recent Labs  Lab 07/31/24 0824 08/02/24 0512  AMMONIA 258* 39*   Coagulation Profile: Recent Labs  Lab 07/31/24 0823  INR 1.6*   Cardiac Enzymes: No results for input(s): CKTOTAL, CKMB, CKMBINDEX, TROPONINI in the last 168 hours. BNP (last 3 results) Recent Labs    08/01/24 0204  PROBNP 655.0*   HbA1C: No results for input(s): HGBA1C in the last 72 hours. CBG: Recent Labs  Lab 08/02/24 1946 08/02/24 2350 08/03/24 0343 08/03/24 0753 08/03/24 1140  GLUCAP 127* 117* 127* 88 176*   Lipid Profile: Recent Labs    08/01/24 0328  TRIG 60   Thyroid  Function Tests: No results for input(s): TSH, T4TOTAL, FREET4, T3FREE, THYROIDAB in the last 72 hours. Anemia Panel: No results for input(s): VITAMINB12, FOLATE, FERRITIN, TIBC, IRON, RETICCTPCT in the last 72 hours. Urine analysis:     Component Value Date/Time   COLORURINE YELLOW (A) 07/31/2024 0824   APPEARANCEUR CLOUDY (A) 07/31/2024 0824   LABSPEC 1.018 07/31/2024 0824   PHURINE 7.0 07/31/2024 0824   GLUCOSEU NEGATIVE 07/31/2024 0824   HGBUR MODERATE (A) 07/31/2024 0824   BILIRUBINUR NEGATIVE 07/31/2024 0824   KETONESUR NEGATIVE 07/31/2024 0824   PROTEINUR 30 (A) 07/31/2024 0824   NITRITE NEGATIVE 07/31/2024 0824   LEUKOCYTESUR LARGE (A) 07/31/2024 0824   Sepsis Labs: @LABRCNTIP (procalcitonin:4,lacticidven:4)  ) Recent Results (from the past 240 hours)  Urine Culture     Status: Abnormal   Collection Time: 07/25/24  7:42 PM   Specimen: Urine, Random  Result Value Ref Range Status   Specimen Description   Final    URINE, RANDOM Performed at [facility], [address], [place], [place] [address]  Special Requests   Final    NONE Reflexed from [id number] Performed at [facility], [address] [place], [place] [address]    Culture (A)  Final    >=100,000 COLONIES/mL CITROBACTER FREUNDII >=100,000 COLONIES/mL MYROIDES SPECIES Sent to [facility] for further susceptibility testing. SEE SEPARATE REPORT Performed at [facility], [address], [place], [place] [address]    Report Status 07/31/2024 FINAL  Final   Organism ID, Bacteria CITROBACTER FREUNDII (A)  Final      Susceptibility   Citrobacter freundii - MIC*    CEFEPIME  <=0.12 SENSITIVE Sensitive     ERTAPENEM <=0.12 SENSITIVE Sensitive     CEFTRIAXONE  32 RESISTANT Resistant     CIPROFLOXACIN  0.12 SENSITIVE Sensitive     GENTAMICIN <=1 SENSITIVE Sensitive     NITROFURANTOIN <=16 SENSITIVE Sensitive     TRIMETH/SULFA >=320 RESISTANT Resistant     PIP/TAZO Value in next row Sensitive      <=4 SENSITIVEThis is a modified FDA-approved test that has been validated and its performance characteristics determined by the reporting laboratory.  This laboratory is certified under the Clinical Laboratory Improvement Amendments  CLIA as qualified to perform high complexity clinical laboratory testing.    MEROPENEM  Value in next row Sensitive      <=4 SENSITIVEThis is a modified FDA-approved test that has been validated and its performance characteristics determined by the reporting laboratory.  This laboratory is certified under the Clinical Laboratory Improvement Amendments CLIA as qualified to perform high complexity clinical laboratory testing.    * >=100,000 COLONIES/mL CITROBACTER FREUNDII  Susceptibility, Aer + Anaerob     Status: Abnormal   Collection Time: 07/25/24  7:42 PM  Result Value Ref Range Status   Suscept, Aer + Anaerob Final report (A)  Corrected    Comment: (NOTE) Performed At: [facility] [address] [place], [place] [id number] Jennette Shorter MD Ey:[id number] CORRECTED ON 12/05 AT 1535: PREVIOUSLY REPORTED AS Preliminary report    Source [email or web address] URINE CULTURE  Final    Comment: Performed at [facility], [address] [place], [place] [address]  Susceptibility Result     Status: Abnormal   Collection Time: 07/25/24  7:42 PM  Result Value Ref Range Status   Suscept Result 1 Comment (A)  Final    Comment: (NOTE) Myroides (Flavobacterium) species Identification performed by account, not confirmed by this laboratory. Testing performed by broth microdilution.    Antimicrobial Suscept Comment  Corrected    Comment: (NOTE)      ** S = Susceptible; I = Intermediate; R = Resistant **                   P = Positive; N = Negative            MICS are expressed in micrograms per mL   Antibiotic                 RSLT#1    RSLT#2    RSLT#3    RSLT#4 Amikacin                       R Cefepime                        I Cefotaxime                     R Ceftazidime  I Ceftriaxone                     R Ciprofloxacin                   S Gentamicin                     I Levofloxacin                    S Meropenem                       S Minocycline                     S Piperacillin /Tazobactam        S Tetracycline                   R Tobramycin                     I Trimethoprim/Sulfa             S Performed At: [facility] [address] [place], [place] [id number] Jennette Shorter MD Ey:[id number]   Blood Culture (routine x 2)     Status: None (Preliminary result)   Collection Time: 07/31/24  8:23 AM   Specimen: BLOOD LEFT ARM  Result Value Ref Range Status   Specimen Description BLOOD LEFT ARM  Final   Special Requests   Final    BOTTLES DRAWN AEROBIC AND ANAEROBIC Blood Culture results may not be optimal due to an inadequate volume of blood received in culture bottles   Culture   Final    NO GROWTH 3 DAYS Performed at [facility], [address] [place], [place] [address]    Report Status PENDING  Incomplete  Urine Culture     Status: Abnormal   Collection Time: 07/31/24  8:24 AM   Specimen: Urine, Random  Result Value Ref Range Status   Specimen Description   Final    URINE, RANDOM Performed at [facility], [address], [place], [place] [address]    Special Requests   Final    NONE Reflexed from [id number] Performed at [facility], [address] [place], [place] [address]    Culture MULTIPLE SPECIES PRESENT, SUGGEST RECOLLECTION (A)  Final   Report Status 08/01/2024 FINAL  Final  Blood Culture (routine x 2)     Status: None (Preliminary result)   Collection Time: 07/31/24  9:52 AM   Specimen: BLOOD  Result Value Ref Range Status   Specimen Description BLOOD BLOOD LEFT HAND  Final   Special Requests   Final    BOTTLES DRAWN AEROBIC AND ANAEROBIC Blood Culture adequate volume   Culture   Final    NO GROWTH 3 DAYS Performed at [facility], [address] [place], [place] [address]    Report Status PENDING  Incomplete  MRSA Next Gen by PCR, Nasal     Status: None   Collection Time: 07/31/24 12:25 PM   Specimen: Nasal Mucosa; Nasal Swab  Result Value Ref Range Status   MRSA by  PCR Next Gen NOT DETECTED NOT DETECTED Final    Comment: (NOTE) The GeneXpert MRSA Assay (FDA approved for NASAL specimens only), is one component of a comprehensive MRSA colonization surveillance program. It is not intended to diagnose MRSA infection nor to guide or monitor treatment for MRSA infections. Test  performance is not FDA approved in patients less than 43 years old. Performed at [facility], [address] [place], [place] [address]          Radiology Studies: No results found.      Scheduled Meds:  aspirin   81 mg Oral Daily   atorvastatin   40 mg Oral Daily   Chlorhexidine  Gluconate Cloth  6 each Topical Daily   citalopram   20 mg Oral Daily   [START ON 08/04/2024] enoxaparin  (LOVENOX ) injection  40 mg Subcutaneous Q24H   furosemide   40 mg Oral Daily   methocarbamol   500 mg Oral BID   pantoprazole   40 mg Oral Daily   QUEtiapine   50 mg Oral QHS   rifaximin   550 mg Oral BID   zinc  sulfate (50mg  elemental zinc )  220 mg Oral Daily   Continuous Infusions:     LOS: 3 days     Devaughn KATHEE Ban, MD [facility]   If 7PM-7AM, please contact night-coverage [email or web address] Password [email or web address] 08/03/2024, 3:11 PM

## 2024-08-03 NOTE — Progress Notes (Signed)
 Tele d/ced as per MD order

## 2024-08-03 NOTE — Progress Notes (Signed)
 Pt wallet locked up with security.  It has money, debit card , [employer] card, ID , insurance card in it.[name]

## 2024-08-03 NOTE — Progress Notes (Signed)
 Speech Language Pathology Treatment: Dysphagia  Patient Details Name: [name] MRN: [id number] DOB: September 28, 1950 Today's Date: 08/03/2024 Time: 1345-1430 SLP Time Calculation (min) (ACUTE ONLY): 45 min  Assessment / Plan / Recommendation Clinical Impression  Pt was seen for ongoing assessment of swallowing; toleration of diet and trials to upgrade diet if able to today. Pt is eager to drink water and has asked for an icee also per NSG.  Pt awake, verbally responded and followed basic 1step directions w/ cues. Pt's speech was intelligible - Edentulous. Pt required support w/ setup for self-feeding of drink and verbal cues to drink slowly. Unsure of pt's Baseline Cognitive functioning. Per MD note, pt is improving but remains encephalopathic.  Noted per pt's chart history, he has had MULTIPLE admits in recent months. Pt appears Deconditioned. On RA-2L, afebrile. WBC low.   Pt appears to present w/ grossly functional oropharyngeal phase swallowing w/ trials of thin liquids assessed at this session today for diet upgrade to thin liquids. No overt pharyngeal phase swallowing deficits w/ trials. ANY Cognitive decline can impact overall awareness/timing of swallow and safety during po tasks which increases risk for aspiration, choking. Pt is also Edentulous at Baseline. Pt's risk for aspiration appears reduced when following general aspiration precautions including feeding support and when using a modified diet consistency of Puree foods. thin liquids.      Pt assisted into an Upright position for po trials -- he needed MOD+ support d/t weakness and Cognition. Pt helped to Hold Cup to drink the thin liquids. He consumed ~8 ozs of thin liquids via Cup then Straw w/ verbal cues to slow down and take smaller sips. No overt clinical s/s of aspiration noted: no decline in vocal quality, no cough, and no decline in respiratory status during/post trials. O2 sats 99-100% during. Oral phase was  adequate for bolus management and oral clearing of the boluses given.  Education given on need to SLOW DOWN and clear mouth b/t each bite. Pt followed instructions w/ fair-good accuracy w/ verbal cues. Suspect decreased insight into risk for aspiration when eating/drinking Impulsively.    In setting of overall medical presentation today, Edentulous status, and previously recommended oral diet consistencies at admits, recommend using the dysphagia level 1(Puree foods) moistened for ease and flavor; upgrade to Thin liquids via Small sips Slowly. General aspiration precautions; reduce Distractions during meals and engage pt during meals for self-feeding. Monitor for Impulsive feeding behaviors and oral clearing b/t bites. Pills Whole vs Crushed vs whole in Puree for safer swallowing. Support/Supervision at meals; monitoring for needs. MD/NSG updated.    Recommend follow w/ Palliative Care and Dietician for support. ST services will f/u w/ ongoing assessment of swallowing and trials to upgrade diet(dys level 2?) as appropriate next 1-2 days. Suspect pt is close to/at his baseline. Education provided to pt/NSG re: diet consistency and precautions. Precautions posted in room, chart.     HPI HPI: Pt is a 73 y/o male with a history of MULTIPLE visits to the Hospital/ED and MULTIPLE medical dxs including T2DM, HTN, COPD, migraines, hepatitis C, w/ likely underlying HCC, HBV, Cirrhosis, TIA, depression, anxiety, PTSD, Hernia, GERD, obstructive sleep apnea, cervical stenosis status post C3-6 ACDF, hyperlipidemia, CKD, chronic foley, CAD, and chronic Heart Failure.  He has had multiple ED visits mostly due to urology complaints.  Recent Discharge summary from 07/03/2024.  Patient presented with fever and lethargy: found to have sepsis secondary to bacteremia from a Klebsiella UTI in the setting of an indwelling  Foley.  Was also noted to have acute hepatic encephalopathy that improved with lactulose  and rifaximin ..   At this admit from his Group Home where he was found unresponsive, there was concern for hepatic encephalopathy and sepsis d/t UTI.  Patient did have worsening mental status after returning from CT and decision was made to proceed with intubation as above.  He was placed on propofol  for postintubation sedation.  Pt was extubated on 12/6, ~1 day later.  Chest Imaging this admit:  No focal airspace consolidation, pleural effusion, or pneumothorax.  Mild cardiomegaly. Tortuous aorta with aortic atherosclerosis. No  acute fracture or destructive lesions. Multilevel thoracic  osteophytosis. Cervical fusion hardware.  Head CT: No acute traumatic injury identified.  2. Stable and normal for age non-contrast CT appearance of the brain.      SLP Plan  Continue with current plan of care        Swallow Evaluation Recommendations   Recommendations: PO diet PO Diet Recommendation: Dysphagia 1 (Pureed);Thin liquids (Level 0) Liquid Administration via: Cup;Straw (monitor) Medication Administration: Whole meds with puree (vs Crushed if needed) Supervision: Patient able to self-feed;Intermittent supervision/cueing for swallowing strategies;Set-up assistance for safety Postural changes: Position pt fully upright for meals;Stay upright 30-60 min after meals;Out of bed for meals (Reflux precs) Oral care recommendations: Oral care BID (2x/day);Staff/trained caregiver to provide oral care Recommended consults: Consider dietitian consultation;Consider Palliative care     Recommendations   See above                  Oral care BID;Staff/trained caregiver to provide oral care   Intermittent Supervision/Assistance Dysphagia, oral phase (R13.11) (baseline Dysphagia previously(oral); Edentulous; deconditioning/illness/hospitalization; need for support at meals/w/ feeding; unsure of Cognitive fx at Baseline)     Continue with current plan of care       [name], MS, CCC-SLP Speech Language  Pathologist Rehab Services; [facility] [phone] (ascom) Bilan Tedesco  08/03/2024, 6:05 PM
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
## 2024-08-03 NOTE — Plan of Care (Signed)
  Problem: Activity: Goal: Ability to tolerate increased activity will improve Outcome: Progressing   Problem: Respiratory: Goal: Ability to maintain a clear airway and adequate ventilation will improve Outcome: Progressing   Problem: Role Relationship: Goal: Method of communication will improve Outcome: Progressing   Problem: Education: Goal: Knowledge of General Education information will improve Description: Including pain rating scale, medication(s)/side effects and non-pharmacologic comfort measures Outcome: Progressing   Problem: Health Behavior/Discharge Planning: Goal: Ability to manage health-related needs will improve Outcome: Progressing   Problem: Clinical Measurements: Goal: Ability to maintain clinical measurements within normal limits will improve Outcome: Progressing Goal: Will remain free from infection Outcome: Progressing Goal: Diagnostic test results will improve Outcome: Progressing Goal: Respiratory complications will improve Outcome: Progressing Goal: Cardiovascular complication will be avoided Outcome: Progressing   Problem: Activity: Goal: Risk for activity intolerance will decrease Outcome: Progressing   Problem: Nutrition: Goal: Adequate nutrition will be maintained Outcome: Progressing   Problem: Coping: Goal: Level of anxiety will decrease Outcome: Progressing

## 2024-08-03 NOTE — Progress Notes (Signed)
 1500 Transferred to room 119 via bed.

## 2024-08-04 LAB — COMPREHENSIVE METABOLIC PANEL WITH GFR
ALT: 22 U/L (ref 0–44)
AST: 46 U/L — ABNORMAL HIGH (ref 15–41)
Albumin: 2.9 g/dL — ABNORMAL LOW (ref 3.5–5.0)
Alkaline Phosphatase: 86 U/L (ref 38–126)
Anion gap: 11 (ref 5–15)
BUN: 27 mg/dL — ABNORMAL HIGH (ref 8–23)
CO2: 21 mmol/L — ABNORMAL LOW (ref 22–32)
Calcium: 10.3 mg/dL (ref 8.9–10.3)
Chloride: 106 mmol/L (ref 98–111)
Creatinine, Ser: 1.23 mg/dL (ref 0.61–1.24)
GFR, Estimated: >60 mL/min (ref >60)
Glucose, Bld: 121 mg/dL — ABNORMAL HIGH (ref 70–99)
Potassium: 4 mmol/L (ref 3.5–5.1)
Sodium: 137 mmol/L (ref 135–145)
Total Bilirubin: 1.4 mg/dL — ABNORMAL HIGH (ref 0.0–1.2)
Total Protein: 7.3 g/dL (ref 6.5–8.1)

## 2024-08-04 LAB — GLUCOSE, CAPILLARY
Glucose-Capillary: 102 mg/dL — ABNORMAL HIGH (ref 70–99)
Glucose-Capillary: 104 mg/dL — ABNORMAL HIGH (ref 70–99)
Glucose-Capillary: 108 mg/dL — ABNORMAL HIGH (ref 70–99)
Glucose-Capillary: 108 mg/dL — ABNORMAL HIGH (ref 70–99)
Glucose-Capillary: 117 mg/dL — ABNORMAL HIGH (ref 70–99)
Glucose-Capillary: 120 mg/dL — ABNORMAL HIGH (ref 70–99)

## 2024-08-04 MED ORDER — LACTULOSE 10 GM/15ML PO SOLN: 10.0000 g | Freq: Two times a day (BID) | ORAL | Status: DC

## 2024-08-04 NOTE — Progress Notes (Addendum)
 PROGRESS NOTE    [name]  FMW:[id number] DOB: 1951/03/27 DOA: 07/31/2024 PCP: [employer]     Brief Narrative:   [name] is a 73 year old male with a past medical history significant for hepatic encephalopathy, COPD, CHF, hypertension, type 2 diabetes mellitus who presented to [facility] ED on 07/31/2024 for evaluation of altered mental status.  Patient is currently intubated and sedated unable to contribute to history, no family currently available, therefore history obtained from chart review.   It is reported that he was sent from his group home after being found unresponsive, with his last known well time occurring when he went to bed last night.  EMS reported some response to painful stimuli.   Of note he was recently admitted on 07/03/2024 after presenting with fever and lethargy.  He was found to be septic due to bacteremia from Klebsiella UTI in the setting of an indwelling Foley catheter along with acute hepatic encephalopathy that improved with lactulose  and rifaximin .   Assessment & Plan:   Principal Problem:   Hepatic encephalopathy (HCC) Active Problems:   Liver cirrhosis (HCC)   Diabetes mellitus type 2, noninsulin dependent (HCC)   Essential hypertension   CAD (coronary artery disease)   Chronic kidney disease, stage 3a (HCC)   Chronic diastolic CHF (congestive heart failure) (HCC)   COPD (chronic obstructive pulmonary disease) (HCC)   History of hepatitis C   Obesity   Hepatitis B infection   Adrenal insufficiency   Chronic indwelling Foley catheter  # Hepatic encephalopathy # Cirrhosis S/p intubation for airway protection. Appears to be improving  No ascites on 12/5 ct. S/p rectal tube - lactulose  held today as having adequate frequency of BMs currently - cont home rifaximin  - follow blood cultures  # Debility Lives in a group home. Group home administrator not confident he's strong enough to return - PT eval pending  #  Dysphagia Slp following, cleared for dysphagia 1 diet  # Pyuria # Chronic indwelling foley Patient discontinue foley while encephalopathic and it has been replaced. No fevers or overt signs of uti. Culture shows mixed species. Has been on ceftriaxone  here - d/c abx and monitor  # Neuropathy - holding home gabapentin  while encephalopathic  # CAD Asymptomatic - home asa, statin  # CKD 2/3a At baseline - monitor  # HFpEF Appears euvolemic, kidney function at baseline - resume home lasix   # MDD - home celexa , seroquel    DVT prophylaxis: lovenox  Code Status: full Family Communication: discussed with group home administrator, says DSS involved and is pursuing guardianship, has no real contacts. Have asked TOC Antonietta) to reach out to DSS, she is waiting to hear back. Per TOC patient is a sex offender, so placement may not be possible.  Level of care: Med-Surg Status is: Inpatient Remains inpatient appropriate because: pending PT eval    Consultants:  none  Procedures: S/p intubation  Antimicrobials:  S/p ceftriaxone     Subjective: Less confused today, no complaints  Objective: Vitals:   08/03/24 2029 08/04/24 0423 08/04/24 0506 08/04/24 0740  BP: (!) 141/69 (!) 168/79  (!) 173/70  Pulse: (!) 55 89  (!) 56  Resp: 12 16  16   Temp: 98.3 F (36.8 C) 98.2 F (36.8 C)  98.4 F (36.9 C)  TempSrc:      SpO2: 100% 97%  100%  Weight:   93 kg   Height:        Intake/Output Summary (Last 24 hours) at 08/04/2024 1302 Last  data filed at 08/03/2024 2109 Gross per 24 hour  Intake 240 ml  Output 250 ml  Net -10 ml   Filed Weights   08/02/24 0500 08/03/24 0500 08/04/24 0506  Weight: 95.2 kg 95.1 kg 93 kg    Examination:  General exam: Appears calm and comfortable  Respiratory system: Clear to auscultation. Respiratory effort normal. Cardiovascular system: S1 & S2 heard, RRR.   Gastrointestinal system: Abdomen is mildly distended, soft and nontender.    Central nervous system: Alert and oriented to self, moving all 4 Extremities: Symmetric 5 x 5 power. No edema Skin: No rashes, lesions or ulcers Psychiatry: calm    Data Reviewed: I have personally reviewed following labs and imaging studies  CBC: Recent Labs  Lab 07/31/24 0823 08/01/24 0328 08/02/24 0512 08/03/24 0658  WBC 3.9* 6.8 5.0 3.5*  NEUTROABS 2.9  --   --   --   HGB 10.1* 10.4* 8.9* 9.1*  HCT 31.6* 31.9* 27.8* 28.2*  MCV 92.9 94.1 95.2 95.3  PLT 118* 93* 84* 76*   Basic Metabolic Panel: Recent Labs  Lab 07/31/24 0952 08/01/24 0204 08/02/24 0512 08/03/24 0658 08/04/24 0552  NA 138 140 143 142 137  K 4.5 4.3 3.8 4.3 4.0  CL 107 111 112* 113* 106  CO2 21* 20* 22 24 21*  GLUCOSE 131* 79 73 91 121*  BUN 19 19 18 22  27*  CREATININE 1.40* 1.30* 1.29* 1.22 1.23  CALCIUM  9.8 9.5 9.4 10.0 10.3  MG  --  2.0 1.7 2.1  --   PHOS  --  2.4* 3.1 2.3*  --    GFR: Estimated Creatinine Clearance: 55.4 mL/min (by C-G formula based on SCr of 1.23 mg/dL). Liver Function Tests: Recent Labs  Lab 07/31/24 0952 08/01/24 0204 08/02/24 0512 08/03/24 0658 08/04/24 0552  AST 34 38 32 32 46*  ALT 18 16 17 17 22   ALKPHOS 79 75 69 77 86  BILITOT 1.3* 1.4* 1.6* 1.1 1.4*  PROT 7.4 7.3 6.3* 6.8 7.3  ALBUMIN  3.0* 2.8* 2.6* 2.7* 2.9*   No results for input(s): LIPASE, AMYLASE in the last 168 hours. Recent Labs  Lab 07/31/24 0824 08/02/24 0512  AMMONIA 258* 39*   Coagulation Profile: Recent Labs  Lab 07/31/24 0823  INR 1.6*   Cardiac Enzymes: No results for input(s): CKTOTAL, CKMB, CKMBINDEX, TROPONINI in the last 168 hours. BNP (last 3 results) Recent Labs    08/01/24 0204  PROBNP 655.0*   HbA1C: No results for input(s): HGBA1C in the last 72 hours. CBG: Recent Labs  Lab 08/03/24 2058 08/04/24 0048 08/04/24 0336 08/04/24 0742 08/04/24 1211  GLUCAP 94 117* 120* 108* 104*   Lipid Profile: No results for input(s): CHOL, HDL, LDLCALC,  TRIG, CHOLHDL, LDLDIRECT in the last 72 hours.  Thyroid  Function Tests: No results for input(s): TSH, T4TOTAL, FREET4, T3FREE, THYROIDAB in the last 72 hours. Anemia Panel: No results for input(s): VITAMINB12, FOLATE, FERRITIN, TIBC, IRON, RETICCTPCT in the last 72 hours. Urine analysis:    Component Value Date/Time   COLORURINE YELLOW (A) 07/31/2024 0824   APPEARANCEUR CLOUDY (A) 07/31/2024 0824   LABSPEC 1.018 07/31/2024 0824   PHURINE 7.0 07/31/2024 0824   GLUCOSEU NEGATIVE 07/31/2024 0824   HGBUR MODERATE (A) 07/31/2024 0824   BILIRUBINUR NEGATIVE 07/31/2024 0824   KETONESUR NEGATIVE 07/31/2024 0824   PROTEINUR 30 (A) 07/31/2024 0824   NITRITE NEGATIVE 07/31/2024 0824   LEUKOCYTESUR LARGE (A) 07/31/2024 0824   Sepsis Labs: @LABRCNTIP (procalcitonin:4,lacticidven:4)  ) Recent Results (from  the past 240 hours)  Urine Culture     Status: Abnormal   Collection Time: 07/25/24  7:42 PM   Specimen: Urine, Random  Result Value Ref Range Status   Specimen Description   Final    URINE, RANDOM Performed at [facility], [address] [place], [place] [address]    Special Requests   Final    NONE Reflexed from [phone] Performed at [facility], [address] [place], [place] [address]    Culture (A)  Final    >=100,000 COLONIES/mL CITROBACTER FREUNDII >=100,000 COLONIES/mL MYROIDES SPECIES Sent to [facility] for further susceptibility testing. SEE SEPARATE REPORT Performed at [facility], [address] [place], [place] [address]    Report Status 07/31/2024 FINAL  Final   Organism ID, Bacteria CITROBACTER FREUNDII (A)  Final      Susceptibility   Citrobacter freundii - MIC*    CEFEPIME  <=0.12 SENSITIVE Sensitive     ERTAPENEM <=0.12 SENSITIVE Sensitive     CEFTRIAXONE  32 RESISTANT Resistant     CIPROFLOXACIN  0.12 SENSITIVE Sensitive     GENTAMICIN <=1 SENSITIVE Sensitive     NITROFURANTOIN <=16 SENSITIVE Sensitive      TRIMETH/SULFA >=320 RESISTANT Resistant     PIP/TAZO Value in next row Sensitive      <=4 SENSITIVEThis is a modified FDA-approved test that has been validated and its performance characteristics determined by the reporting laboratory.  This laboratory is certified under the Clinical Laboratory Improvement Amendments CLIA as qualified to perform high complexity clinical laboratory testing.    MEROPENEM  Value in next row Sensitive      <=4 SENSITIVEThis is a modified FDA-approved test that has been validated and its performance characteristics determined by the reporting laboratory.  This laboratory is certified under the Clinical Laboratory Improvement Amendments CLIA as qualified to perform high complexity clinical laboratory testing.    * >=100,000 COLONIES/mL CITROBACTER FREUNDII  Susceptibility, Aer + Anaerob     Status: Abnormal   Collection Time: 07/25/24  7:42 PM  Result Value Ref Range Status   Suscept, Aer + Anaerob Final report (A)  Corrected    Comment: (NOTE) Performed At: [facility] [address] [place], [place] [id number] Jennette Shorter MD Ey:[id number] CORRECTED ON 12/05 AT 1535: PREVIOUSLY REPORTED AS Preliminary report    Source [email or web address].SPP URINE CULTURE  Final    Comment: Performed at [facility], [address] [place], [place] [address]  Susceptibility Result     Status: Abnormal   Collection Time: 07/25/24  7:42 PM  Result Value Ref Range Status   Suscept Result 1 Comment (A)  Final    Comment: (NOTE) Myroides (Flavobacterium) species Identification performed by account, not confirmed by this laboratory. Testing performed by broth microdilution.    Antimicrobial Suscept Comment  Corrected    Comment: (NOTE)      ** S = Susceptible; I = Intermediate; R = Resistant **                   P = Positive; N = Negative            MICS are expressed in micrograms per mL   Antibiotic                 RSLT#1    RSLT#2    RSLT#3    RSLT#4 Amikacin  R Cefepime                        I Cefotaxime                     R Ceftazidime                    I Ceftriaxone                     R Ciprofloxacin                   S Gentamicin                     I Levofloxacin                    S Meropenem                       S Minocycline                    S Piperacillin /Tazobactam        S Tetracycline                   R Tobramycin                     I Trimethoprim/Sulfa             S Performed At: [facility] [address] [place], [place] [id number] Jennette Shorter MD Ey:[id number]   Blood Culture (routine x 2)     Status: None (Preliminary result)   Collection Time: 07/31/24  8:23 AM   Specimen: BLOOD LEFT ARM  Result Value Ref Range Status   Specimen Description BLOOD LEFT ARM  Final   Special Requests   Final    BOTTLES DRAWN AEROBIC AND ANAEROBIC Blood Culture results may not be optimal due to an inadequate volume of blood received in culture bottles   Culture   Final    NO GROWTH 4 DAYS Performed at [facility], [address] [place], [place] [address]    Report Status PENDING  Incomplete  Urine Culture     Status: Abnormal   Collection Time: 07/31/24  8:24 AM   Specimen: Urine, Random  Result Value Ref Range Status   Specimen Description   Final    URINE, RANDOM Performed at [facility], [address], [place], [place] [address]    Special Requests   Final    NONE Reflexed from [phone] Performed at [name], [address] [place], [place] [address]    Culture MULTIPLE SPECIES PRESENT, SUGGEST RECOLLECTION (A)  Final   Report Status 08/01/2024 FINAL  Final  Blood Culture (routine x 2)     Status: None (Preliminary result)   Collection Time: 07/31/24  9:52 AM   Specimen: BLOOD  Result Value Ref Range Status   Specimen Description BLOOD BLOOD LEFT HAND  Final   Special Requests   Final    BOTTLES DRAWN AEROBIC AND ANAEROBIC Blood Culture adequate volume    Culture   Final    NO GROWTH 4 DAYS Performed at [facility], [address] [place], [place] [address]    Report Status PENDING  Incomplete  MRSA Next Gen by PCR, Nasal     Status: None   Collection Time: 07/31/24 12:25 PM  Specimen: Nasal Mucosa; Nasal Swab  Result Value Ref Range Status   MRSA by PCR Next Gen NOT DETECTED NOT DETECTED Final    Comment: (NOTE) The GeneXpert MRSA Assay (FDA approved for NASAL specimens only), is one component of a comprehensive MRSA colonization surveillance program. It is not intended to diagnose MRSA infection nor to guide or monitor treatment for MRSA infections. Test performance is not FDA approved in patients less than 15 years old. Performed at [facility], [address], [place], [place] [address]          Radiology Studies: No results found.      Scheduled Meds:  aspirin   81 mg Oral Daily   atorvastatin   40 mg Oral Daily   Chlorhexidine  Gluconate Cloth  6 each Topical Daily   citalopram   20 mg Oral Daily   enoxaparin  (LOVENOX ) injection  40 mg Subcutaneous Q24H   furosemide   40 mg Oral Daily   methocarbamol   500 mg Oral BID   pantoprazole   40 mg Oral Daily   QUEtiapine   50 mg Oral QHS   rifaximin   550 mg Oral BID   zinc  sulfate (50mg  elemental zinc )  220 mg Oral Daily   Continuous Infusions:     LOS: 4 days     Devaughn KATHEE Ban, MD [facility]   If 7PM-7AM, please contact night-coverage [email or web address] Password [email or web address] 08/04/2024, 1:02 PM

## 2024-08-04 NOTE — TOC Progression Note (Signed)
 Transition of Care [facility]) - Progression Note    Patient Details  Name: [name] MRN: [id number] Date of Birth: 10/08/1950  [facility] CM/SW Contact  Dalia GORMAN Fuse, RN Phone Number: 08/04/2024, 2:55 PM  Clinical Narrative:     TOC lvmm with [place] DSS requesting callback with update on guardianship status. TOC spoke with [name] at [facility] and he explained that DSS has started the guardianship process, but they have not notified him that the hearing has been completed. The patient can come back to [facility] once he lives the hospital or STR.  TOC reviewed the patient's medical record. The patient is on the sex offender list and likely wont be accepted at [facility].  TOC will continue to follow.                    Expected Discharge Plan and Services                                               Social Drivers of Health (SDOH) Interventions SDOH Screenings   Food Insecurity: Patient Unable To Answer (08/01/2024)  Housing: Low Risk  (06/29/2024)  Transportation Needs: No Transportation Needs (06/28/2024)  Utilities: Not At Risk (06/28/2024)  Alcohol Screen: Low Risk  (09/24/2022)  Depression (PHQ2-9): High Risk (04/26/2022)  Financial Resource Strain: High Risk (04/27/2022)  Physical Activity: Sufficiently Active (04/16/2018)  Social Connections: Socially Isolated (06/28/2024)  Stress: Stress Concern Present (04/16/2018)  Tobacco Use: High Risk (07/31/2024)    Readmission Risk Interventions    11/29/2023    5:03 PM 11/13/2023    2:21 PM 11/12/2023    4:00 PM  Readmission Risk Prevention Plan  Transportation Screening Not Complete  Not Complete  Medication Review Oceanographer) Not Complete Complete Not Complete  PCP or Specialist appointment within 3-5 days of discharge Not Complete Complete Complete  HRI or Home Care Consult Not Complete Complete   SW Recovery Care/Counseling Consult Not Complete Complete   Palliative Care Screening  Not Applicable Complete   Skilled Nursing Facility Not Applicable Not Applicable

## 2024-08-04 NOTE — Plan of Care (Signed)
  Problem: Activity: Goal: Ability to tolerate increased activity will improve Outcome: Progressing   Problem: Respiratory: Goal: Ability to maintain a clear airway and adequate ventilation will improve Outcome: Progressing   Problem: Role Relationship: Goal: Method of communication will improve Outcome: Progressing   Problem: Education: Goal: Knowledge of General Education information will improve Description: Including pain rating scale, medication(s)/side effects and non-pharmacologic comfort measures Outcome: Progressing   Problem: Health Behavior/Discharge Planning: Goal: Ability to manage health-related needs will improve Outcome: Progressing   Problem: Clinical Measurements: Goal: Ability to maintain clinical measurements within normal limits will improve Outcome: Progressing Goal: Will remain free from infection Outcome: Progressing Goal: Diagnostic test results will improve Outcome: Progressing Goal: Respiratory complications will improve Outcome: Progressing Goal: Cardiovascular complication will be avoided Outcome: Progressing   Problem: Activity: Goal: Risk for activity intolerance will decrease Outcome: Progressing   Problem: Nutrition: Goal: Adequate nutrition will be maintained Outcome: Progressing   Problem: Coping: Goal: Level of anxiety will decrease Outcome: Progressing   Problem: Elimination: Goal: Will not experience complications related to bowel motility Outcome: Progressing Goal: Will not experience complications related to urinary retention Outcome: Progressing   Problem: Pain Managment: Goal: General experience of comfort will improve and/or be controlled Outcome: Progressing   Problem: Safety: Goal: Ability to remain free from injury will improve Outcome: Progressing   Problem: Skin Integrity: Goal: Risk for impaired skin integrity will decrease Outcome: Progressing

## 2024-08-04 NOTE — Evaluation (Signed)
 Physical Therapy Evaluation Patient Details Name: [name] MRN: [id number] DOB: 1951-08-11 Today's Date: 08/04/2024  History of Present Illness  presented to ER after being found unresponsive at group home; admitted for management of acute hepatic encephalopathy requiring intubation 12/5-6 for airway protection.  Clinical Impression  Patient sleeping upon arrival to room; easily awakens to light touch and voice.  Oriented to self and location (as hospital) only; unaware of date, reason for admission.  Generally confused to more complex information; difficulty maintaining attention to topic at times. Generally weak and deconditioned throughout all extremities, but no focal weakness, sensory or coordination deficits appreciated.  Does require act assist from therapist for movement throughout full ROM, all extremities (limited by weakness and overall cognitive awareness of situation).  Endorses generalized soreness throughout body; improved with movement and repositioning. Currently requiring mod/max assist for bed mobility; min/mod assist for brief period of unsupported sitting.  Did spontaneously return self to supine after unsupported sitting x30-45 seconds, max/dep assist for repositioning to neutral. Unable to tolerate progressive mobility efforts beyond bed-level at this time; will continue to assess/progress in subsequent sessions as appropriate. Repositioned in bed and transitioned to chair position in bed (lights on, TV on) to stimluate alertness and for overall pulmonary hygiene/repositioning.  In supported sitting, did participate with light oral care and grooming activities, mod/max assist from therapist to complete. Would benefit from skilled PT to address above deficits and promote optimal return to PLOF.; recommend post-acute PT follow up as indicated by interdisciplinary care team.          If plan is discharge home, recommend the following: A lot of help with walking and/or  transfers;A lot of help with bathing/dressing/bathroom   Can travel by private vehicle   No    Equipment Recommendations    Recommendations for Other Services       Functional Status Assessment Patient has had a recent decline in their functional status and demonstrates the ability to make significant improvements in function in a reasonable and predictable amount of time.     Precautions / Restrictions Precautions Precautions: Fall Restrictions Weight Bearing Restrictions Per Provider Order: No      Mobility  Bed Mobility Overal bed mobility: Needs Assistance Bed Mobility: Supine to Sit, Sit to Supine     Supine to sit: Mod assist, Max assist Sit to supine: Min assist, Mod assist   General bed mobility comments: spontaneously returns self to supine after unsupported sitting x30-45 seconds    Transfers                   General transfer comment: unsafe/unable    Ambulation/Gait               General Gait Details: unsafe/unable  Stairs            Wheelchair Mobility     Tilt Bed    Modified Rankin (Stroke Patients Only)       Balance Overall balance assessment: Needs assistance Sitting-balance support: Feet supported, No upper extremity supported Sitting balance-Leahy Scale: Poor Sitting balance - Comments: min/mod assist to maintain                                     Pertinent Vitals/Pain Pain Assessment Pain Assessment: Faces Faces Pain Scale: Hurts little more Pain Location: generalized soreness Pain Descriptors / Indicators: Sore Pain Intervention(s): Limited activity within patient's tolerance, Monitored  during session, Repositioned    Home Living Family/patient expects to be discharged to:: Group home Living Arrangements: Group Home   Type of Home: Group Home             Additional Comments: Patient poor historian; unable to provide details of group home environment.  Per previous documenation,  has ramp access to enter/exit    Prior Function Prior Level of Function : Patient poor historian/Family not available             Mobility Comments: Pt reports being IND with mobility with a rollator, hx of frequent falls-will verify with family/facility as available       Extremity/Trunk Assessment   Upper Extremity Assessment Upper Extremity Assessment: Generalized weakness    Lower Extremity Assessment Lower Extremity Assessment: Generalized weakness (grossly at least 3-/5 throughout; intermittently resistant to movement attempts by therapist)       Communication   Communication Communication: Impaired Factors Affecting Communication: Reduced clarity of speech    Cognition Arousal: Lethargic Behavior During Therapy: Flat affect   PT - Cognitive impairments: No family/caregiver present to determine baseline                       PT - Cognition Comments: Oriented to self and location as hospital only; unaware of date, reason for admission Following commands: Impaired Following commands impaired: Follows one step commands inconsistently     Cueing Cueing Techniques: Verbal cues, Tactile cues, Gestural cues     General Comments      Exercises Other Exercises Other Exercises: Repositioned in bed and transitioned to chair position in bed (lights on, TV on) to stimluate alertness and for overall pulmonary hygiene/repositioning.  In supported sitting, did participate with light oral care and grooming activities, mod/max assist from therapist to complete.   Assessment/Plan    PT Assessment Patient needs continued PT services  PT Problem List Decreased strength;Decreased activity tolerance;Decreased range of motion;Decreased balance;Decreased mobility;Decreased cognition;Decreased knowledge of use of DME;Decreased safety awareness;Decreased knowledge of precautions       PT Treatment Interventions DME instruction;Gait training;Functional mobility  training;Therapeutic activities;Therapeutic exercise;Balance training;Cognitive remediation;Patient/family education    PT Goals (Current goals can be found in the Care Plan section)  Acute Rehab PT Goals PT Goal Formulation: Patient unable to participate in goal setting Time For Goal Achievement: 08/18/24 Potential to Achieve Goals: Fair    Frequency Min 2X/week     Co-evaluation               AM-PAC PT 6 Clicks Mobility  Outcome Measure Help needed turning from your back to your side while in a flat bed without using bedrails?: A Lot Help needed moving from lying on your back to sitting on the side of a flat bed without using bedrails?: A Lot Help needed moving to and from a bed to a chair (including a wheelchair)?: Total Help needed standing up from a chair using your arms (e.g., wheelchair or bedside chair)?: Total Help needed to walk in hospital room?: Total Help needed climbing 3-5 steps with a railing? : Total 6 Click Score: 8    End of Session   Activity Tolerance: Patient limited by fatigue Patient left: in bed;with call bell/phone within reach;with bed alarm set   PT Visit Diagnosis: Muscle weakness (generalized) (M62.81);Difficulty in walking, not elsewhere classified (R26.2)    Time: 8541-8483 PT Time Calculation (min) (ACUTE ONLY): 18 min   Charges:   PT Evaluation $PT Eval Moderate  Complexity: 1 Mod   PT General Charges $$ ACUTE PT VISIT: 1 Visit         Dezirea Mccollister H. Delores, PT, DPT, NCS 08/04/24, 6:18 PM [phone]

## 2024-08-05 DIAGNOSIS — K7682 Hepatic encephalopathy: Secondary | ICD-10-CM | POA: Diagnosis not present

## 2024-08-05 LAB — GLUCOSE, CAPILLARY
Glucose-Capillary: 110 mg/dL — ABNORMAL HIGH (ref 70–99)
Glucose-Capillary: 113 mg/dL — ABNORMAL HIGH (ref 70–99)
Glucose-Capillary: 122 mg/dL — ABNORMAL HIGH (ref 70–99)
Glucose-Capillary: 135 mg/dL — ABNORMAL HIGH (ref 70–99)
Glucose-Capillary: 74 mg/dL (ref 70–99)
Glucose-Capillary: 83 mg/dL (ref 70–99)

## 2024-08-05 LAB — CULTURE, BLOOD (ROUTINE X 2)
Culture: NO GROWTH
Culture: NO GROWTH
Special Requests: ADEQUATE

## 2024-08-05 MED ORDER — SPIRONOLACTONE 12.5 MG HALF TABLET
12.5000 mg | ORAL_TABLET | Freq: Every day | ORAL | Status: DC
  Administered 2024-08-05 – 2024-08-06 (×2): 12.5 mg via ORAL
  Filled 2024-08-05 (×2): qty 1

## 2024-08-05 MED ORDER — GABAPENTIN 300 MG PO CAPS
300.0000 mg | ORAL_CAPSULE | Freq: Two times a day (BID) | ORAL | Status: DC
  Administered 2024-08-05 – 2024-08-06 (×3): 300 mg via ORAL
  Filled 2024-08-05 (×3): qty 1

## 2024-08-05 MED ORDER — METHOCARBAMOL 500 MG PO TABS: 500.0000 mg | ORAL_TABLET | Freq: Three times a day (TID) | ORAL | Status: AC | PRN

## 2024-08-05 MED ORDER — CALCIUM CARBONATE ANTACID 500 MG PO CHEW
1.0000 | CHEWABLE_TABLET | Freq: Every day | ORAL | Status: DC | PRN
  Administered 2024-08-05: 200 mg via ORAL
  Filled 2024-08-05: qty 1

## 2024-08-05 NOTE — Progress Notes (Signed)
 Mobility Specialist - Progress Note   08/05/24 1400  Mobility  Activity Ambulated with assistance;Stood at bedside;Dangled on edge of bed  Level of Assistance Contact guard assist, steadying assist  Assistive Device Front wheel walker  Distance Ambulated (ft) 360 ft  Range of Motion/Exercises All extremities;Active  Activity Response Tolerated well  Mobility visit 1 Mobility  Mobility Specialist Start Time (ACUTE ONLY) 1202  Mobility Specialist Stop Time (ACUTE ONLY) 1228  Mobility Specialist Time Calculation (min) (ACUTE ONLY) 26 min   Pt was supine in bed with the HOB elevated on RA upon entry. Pt is able today to get the EOB independently with bed features. Pt is able to STS independently with 2 WW. Pt ambulated well. Pt did not need a recovery break for vitals. Pt vitals remained WNL. After activity pt returned back to the room in bed with needs in reach and bed alarm on.  Clem Rodes Mobility Specialist 08/05/24, 2:19 PM

## 2024-08-05 NOTE — Progress Notes (Addendum)
 PROGRESS NOTE    [name]  FMW:[id number] DOB: 1951/04/14 DOA: 07/31/2024 PCP: [facility]     Brief Narrative:   [name] is a 73 year old male with a past medical history significant for hepatic encephalopathy, COPD, CHF, hypertension, type 2 diabetes mellitus who presented to [facility] ED on 07/31/2024 for evaluation of altered mental status. On presentation, Patient was intubated and sedated. It is reported that he was sent from his group home after being found unresponsive, with his last known well time occurring when he went to bed last night. Hospital course as below [place], [place] [facility] with patient consent, unable to leave voicemail - did not answer   Of note he was recently admitted on 07/03/2024 after presenting with fever and lethargy.  He was found to be septic due to bacteremia from Klebsiella UTI in the setting of an indwelling Foley catheter along with acute hepatic encephalopathy that improved with lactulose  and rifaximin .   Assessment & Plan:   Principal Problem:   Hepatic encephalopathy (HCC) Active Problems:   Liver cirrhosis (HCC)   Diabetes mellitus type 2, noninsulin dependent (HCC)   Essential hypertension   CAD (coronary artery disease)   Chronic kidney disease, stage 3a (HCC)   Chronic diastolic CHF (congestive heart failure) (HCC)   COPD (chronic obstructive pulmonary disease) (HCC)   History of hepatitis C   Obesity   Hepatitis B infection   Adrenal insufficiency   Chronic indwelling Foley catheter  Hepatic encephalopathy - resolved Cirrhosis S/p intubation for airway protection. No ascites on 12/5 ct - cont home rifaximin , Lactulose    Debility Lives in a group home. Group home administrator not confident he's strong enough to return - PT rec SNF   Dysphagia Seen by SLP, cleared for dysphagia 2 diet   Pyuria Chronic indwelling foley discontinue foley while encephalopathic and it has been replaced. No  fevers or overt signs of uti. Culture shows mixed species. Has been on ceftriaxone  here - d/c abx and monitor   Neuropathy - Resume Gabapentin  12/10   CAD Asymptomatic - home asa, statin   CKD 2/3a At baseline   HFpEF Appears euvolemic, kidney function at baseline - continue home diuretics   MDD - home celexa , seroquel    Obesity Class I - Weight loss and Lifestyle modification   -- Requesting morphine  for vague complaints and each time reports different pain, does not appear to be in pain.  No indication for opioids   DVT prophylaxis: lovenox  Code Status: full Family Communication: called group home administrator, no answer  Level of care: Med-Surg Status is: Inpatient Remains inpatient appropriate because: Recommend SNF    Consultants:  none  Procedures: S/p intubation  Antimicrobials:  S/p ceftriaxone     Subjective: Less confused today, no complaints Plan was to discharge to [facility], but was refused due to being on sex offender registry  Objective: Vitals:   08/05/24 0418 08/05/24 0439 08/05/24 0802 08/05/24 1458  BP: 139/71  (!) 163/73 (!) 159/77  Pulse: (!) 58  (!) 52 (!) 55  Resp: 17  16 18   Temp: 98.3 F (36.8 C)  99.2 F (37.3 C) 98.5 F (36.9 C)  TempSrc: Oral     SpO2: 99%  99% 99%  Weight:  94.5 kg    Height:        Intake/Output Summary (Last 24 hours) at 08/05/2024 1633 Last data filed at 08/04/2024 2207 Gross per 24 hour  Intake 300 ml  Output --  Net 300 ml   Filed Weights   08/03/24 0500 08/04/24 0506 08/05/24 0439  Weight: 95.1 kg 93 kg 94.5 kg    Examination:  General exam: Appears calm and comfortable  Respiratory system: Clear to auscultation. Respiratory effort normal. Cardiovascular system: S1 & S2 heard, RRR.   Gastrointestinal system: Abdomen is mildly distended, soft and nontender.   Central nervous system: Alert and oriented to self, moving all 4 Extremities: Symmetric 5 x 5 power. No edema Skin:  No rashes, lesions or ulcers Psychiatry: calm    Data Reviewed: I have personally reviewed following labs and imaging studies  CBC: Recent Labs  Lab 07/31/24 0823 08/01/24 0328 08/02/24 0512 08/03/24 0658  WBC 3.9* 6.8 5.0 3.5*  NEUTROABS 2.9  --   --   --   HGB 10.1* 10.4* 8.9* 9.1*  HCT 31.6* 31.9* 27.8* 28.2*  MCV 92.9 94.1 95.2 95.3  PLT 118* 93* 84* 76*   Basic Metabolic Panel: Recent Labs  Lab 07/31/24 0952 08/01/24 0204 08/02/24 0512 08/03/24 0658 08/04/24 0552  NA 138 140 143 142 137  K 4.5 4.3 3.8 4.3 4.0  CL 107 111 112* 113* 106  CO2 21* 20* 22 24 21*  GLUCOSE 131* 79 73 91 121*  BUN 19 19 18 22  27*  CREATININE 1.40* 1.30* 1.29* 1.22 1.23  CALCIUM  9.8 9.5 9.4 10.0 10.3  MG  --  2.0 1.7 2.1  --   PHOS  --  2.4* 3.1 2.3*  --    GFR: Estimated Creatinine Clearance: 55.9 mL/min (by C-G formula based on SCr of 1.23 mg/dL). Liver Function Tests: Recent Labs  Lab 07/31/24 0952 08/01/24 0204 08/02/24 0512 08/03/24 0658 08/04/24 0552  AST 34 38 32 32 46*  ALT 18 16 17 17 22   ALKPHOS 79 75 69 77 86  BILITOT 1.3* 1.4* 1.6* 1.1 1.4*  PROT 7.4 7.3 6.3* 6.8 7.3  ALBUMIN  3.0* 2.8* 2.6* 2.7* 2.9*   No results for input(s): LIPASE, AMYLASE in the last 168 hours. Recent Labs  Lab 07/31/24 0824 08/02/24 0512  AMMONIA 258* 39*   Coagulation Profile: Recent Labs  Lab 07/31/24 0823  INR 1.6*   Cardiac Enzymes: No results for input(s): CKTOTAL, CKMB, CKMBINDEX, TROPONINI in the last 168 hours. BNP (last 3 results) Recent Labs    08/01/24 0204  PROBNP 655.0*   HbA1C: No results for input(s): HGBA1C in the last 72 hours. CBG: Recent Labs  Lab 08/04/24 2029 08/05/24 0010 08/05/24 0422 08/05/24 0745 08/05/24 1145  GLUCAP 108* 110* 83 74 122*   Lipid Profile: No results for input(s): CHOL, HDL, LDLCALC, TRIG, CHOLHDL, LDLDIRECT in the last 72 hours.  Thyroid  Function Tests: No results for input(s): TSH,  T4TOTAL, FREET4, T3FREE, THYROIDAB in the last 72 hours. Anemia Panel: No results for input(s): VITAMINB12, FOLATE, FERRITIN, TIBC, IRON, RETICCTPCT in the last 72 hours. Urine analysis:    Component Value Date/Time   COLORURINE YELLOW (A) 07/31/2024 0824   APPEARANCEUR CLOUDY (A) 07/31/2024 0824   LABSPEC 1.018 07/31/2024 0824   PHURINE 7.0 07/31/2024 0824   GLUCOSEU NEGATIVE 07/31/2024 0824   HGBUR MODERATE (A) 07/31/2024 0824   BILIRUBINUR NEGATIVE 07/31/2024 0824   KETONESUR NEGATIVE 07/31/2024 0824   PROTEINUR 30 (A) 07/31/2024 0824   NITRITE NEGATIVE 07/31/2024 0824   LEUKOCYTESUR LARGE (A) 07/31/2024 0824   Sepsis Labs: @LABRCNTIP (procalcitonin:4,lacticidven:4)  ) Recent Results (from the past 240 hours)  Blood Culture (routine x 2)     Status: None  Collection Time: 07/31/24  8:23 AM   Specimen: BLOOD LEFT ARM  Result Value Ref Range Status   Specimen Description BLOOD LEFT ARM  Final   Special Requests   Final    BOTTLES DRAWN AEROBIC AND ANAEROBIC Blood Culture results may not be optimal due to an inadequate volume of blood received in culture bottles   Culture   Final    NO GROWTH 5 DAYS Performed at [name], [address] [place], [place] [address]    Report Status 08/05/2024 FINAL  Final  Urine Culture     Status: Abnormal   Collection Time: 07/31/24  8:24 AM   Specimen: Urine, Random  Result Value Ref Range Status   Specimen Description   Final    URINE, RANDOM Performed at [facility], [address] [place], [place] [address]    Special Requests   Final    NONE Reflexed from [id number] Performed at [facility], [address] [place], [place] [address]    Culture MULTIPLE SPECIES PRESENT, SUGGEST RECOLLECTION (A)  Final   Report Status 08/01/2024 FINAL  Final  Blood Culture (routine x 2)     Status: None   Collection Time: 07/31/24  9:52 AM   Specimen: BLOOD  Result Value Ref Range Status    Specimen Description BLOOD BLOOD LEFT HAND  Final   Special Requests   Final    BOTTLES DRAWN AEROBIC AND ANAEROBIC Blood Culture adequate volume   Culture   Final    NO GROWTH 5 DAYS Performed at [facility], [address], [place], [place] [address]    Report Status 08/05/2024 FINAL  Final  MRSA Next Gen by PCR, Nasal     Status: None   Collection Time: 07/31/24 12:25 PM   Specimen: Nasal Mucosa; Nasal Swab  Result Value Ref Range Status   MRSA by PCR Next Gen NOT DETECTED NOT DETECTED Final    Comment: (NOTE) The GeneXpert MRSA Assay (FDA approved for NASAL specimens only), is one component of a comprehensive MRSA colonization surveillance program. It is not intended to diagnose MRSA infection nor to guide or monitor treatment for MRSA infections. Test performance is not FDA approved in patients less than 81 years old. Performed at [facility], [address], [place], [place] [address]          Radiology Studies: No results found.      Scheduled Meds:  aspirin   81 mg Oral Daily   atorvastatin   40 mg Oral Daily   Chlorhexidine  Gluconate Cloth  6 each Topical Daily   citalopram   20 mg Oral Daily   enoxaparin  (LOVENOX ) injection  40 mg Subcutaneous Q24H   furosemide   40 mg Oral Daily   gabapentin   300 mg Oral BID   methocarbamol   500 mg Oral BID   pantoprazole   40 mg Oral Daily   QUEtiapine   50 mg Oral QHS   rifaximin   550 mg Oral BID   zinc  sulfate (50mg  elemental zinc )  220 mg Oral Daily   Continuous Infusions:     LOS: 5 days     Laree Lock, MD [facility]   If 7PM-7AM, please contact night-coverage [email or web address] Password [email or web address] 08/05/2024, 4:33 PM

## 2024-08-05 NOTE — [place] FL2 (Signed)
 [place]  MEDICAID FL2 LEVEL OF CARE FORM     IDENTIFICATION  Patient Name: [name] Birthdate: 1951-05-23 Sex: male Admission Date (Current Location): 07/31/2024  [place] and [place] Number:  Chiropodist and Address:  [facility], [address], [place], [place] [address]      Provider Number: [id number]  Attending Physician Name and Address:  Jerelene Critchley, MD  Relative Name and Phone Number:  [name] Terryl Hertz woner- [phone]    Current Level of Care: Hospital Recommended Level of Care: Skilled Nursing Facility Prior Approval Number:    Date Approved/Denied:   PASRR Number: [id number]  Discharge Plan: SNF    Current Diagnoses: Patient Active Problem List   Diagnosis Date Noted   Iron deficiency anemia 07/03/2024   Gram-negative bacteremia 06/30/2024   Sepsis due to Gram-negative organism with acute renal failure (HCC) 06/27/2024   Hyponatremia 06/27/2024   Acute on chronic anemia 06/27/2024   Dyslipidemia 06/27/2024   Peripheral neuropathy 06/27/2024   GERD without esophagitis 06/27/2024   Bacteremia 02/18/2024   Complicated UTI (urinary tract infection) 02/17/2024   Chronic indwelling Foley catheter 01/03/2024   Hypomagnesemia 12/30/2023   Depression with anxiety 12/30/2023   Altered mental status 11/12/2023   Chronic kidney disease, stage 3a (HCC) 10/21/2023   Myocardial injury 10/21/2023   CKD stage 3a, GFR 45-59 ml/min (HCC) - baseline scr 1.3 10/01/2023   Hepatic encephalopathy (HCC) 09/30/2023   Liver cirrhosis (HCC) 09/30/2023   Essential hypertension 09/30/2023   Diabetes mellitus type 2, noninsulin dependent (HCC) 09/30/2023   CAD (coronary artery disease) 09/30/2023   Hyperlipidemia 09/30/2023   Hepatic encephalopathy (HCC) 08/01/2023   Hyperammonemia 07/30/2023   Cervical spinal stenosis 07/01/2023   Cervical myelopathy (HCC) 06/21/2023   Cervical stenosis of spine 06/21/2023   Cervical  radiculopathy 06/20/2023   E coli bacteremia 06/17/2023   UTI (urinary tract infection) 06/17/2023   Left leg cellulitis 03/23/2023   Leukocytosis 03/23/2023   Left leg pain 03/23/2023   Suicidal ideation 01/14/2023   Acute pain 01/14/2023   Adrenal insufficiency 12/26/2022   Orthostatic hypotension 12/24/2022   Rib pain on right side 12/24/2022   Hepatitis B infection 12/24/2022   Myocardial ischemia 12/24/2022   Acute appendicitis 11/05/2022   Pancytopenia (HCC) 11/05/2022   Sinus bradycardia 11/05/2022   Acute metabolic encephalopathy 09/22/2022   Liver lesion 09/22/2022   Obesity (BMI 30-39.9) 09/22/2022   Acute hepatic encephalopathy (HCC) 09/22/2022   MDD (major depressive disorder), recurrent severe, 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   Major depressive disorder, 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 psychotic features (HCC) 09/15/2022   Protein-calorie malnutrition, severe 08/31/2022   Hypotension 08/31/2022   Weakness    Hx of transient ischemic attack (TIA) 04/28/2022   Obesity    Chronic diastolic CHF (congestive heart failure) (HCC)    Depression    Septic shock (HCC) 03/28/2022   Bacteremia due to Klebsiella pneumoniae 03/28/2022   Hypoglycemia 03/28/2022   Syncope 03/24/2022   Hypokalemia 03/24/2022   Fever 03/24/2022   COPD (chronic obstructive pulmonary disease) (HCC)    Elevated troponin    Abnormal LFTs    History of hepatitis C    AKI (acute kidney injury)    Pure hypercholesterolemia    Obesity, Class III, BMI 40-49.9 (morbid obesity) (HCC) 04/18/2020   Thrombocytopenia 04/17/2020   Leukopenia 04/17/2020   HTN (hypertension) 04/17/2018   Uncontrolled type 2 diabetes mellitus with hyperglycemia, without long-term current use of insulin  (HCC) 04/17/2018   Lymphedema 04/17/2018  Normocytic anemia 04/07/2018   Chest pain 03/20/2018   Unsheltered homelessness 12/16/2017    Orientation RESPIRATION BLADDER Height & Weight     Self, Time  Normal Indwelling  catheter Weight: 94.5 kg Height:  5' 6 (167.6 cm)  BEHAVIORAL SYMPTOMS/MOOD NEUROLOGICAL BOWEL NUTRITION STATUS      Incontinent Diet (Regular)  AMBULATORY STATUS COMMUNICATION OF NEEDS Skin   Extensive Assist Verbally Bruising (intact, bruising)                       Personal Care Assistance Level of Assistance  Bathing, Feeding, Dressing Bathing Assistance: Limited assistance Feeding assistance: Limited assistance Dressing Assistance: Limited assistance     Functional Limitations Info  Sight, Hearing, Speech Sight Info: Adequate Hearing Info: Adequate Speech Info: Adequate    SPECIAL CARE FACTORS FREQUENCY  PT (By licensed PT), OT (By licensed OT)     PT Frequency: 5 times per week OT Frequency: 5 times per week            Contractures Contractures Info: Not present    Additional Factors Info  Code Status, Allergies Code Status Info: Full Allergies Info: Bee Venom High  Anaphylaxis   Codeine  High Allergy Itching Only itching. Recently allergy tested at [facility]  Rsv Mrna Pre-f Virus Vaccine High  Swelling   Meloxicam Medium   Other Reaction(s): ?overdose of local given at dentist--had trouble breathing  Novocain (procaine) Not Specified     Sulfa Antibiotics Not Specified   hallucinations           Current Medications (08/05/2024):  This is the current hospital active medication list Current Facility-Administered Medications  Medication Dose Route Frequency Provider Last Rate Last Admin   acetaminophen  (TYLENOL ) tablet 325 mg  325 mg Oral Q6H PRN Kandis Devaughn Sayres, MD   325 mg at 08/04/24 1409   aspirin  chewable tablet 81 mg  81 mg Oral Daily Kandis Devaughn Sayres, MD   81 mg at 08/05/24 0932   atorvastatin  (LIPITOR) tablet 40 mg  40 mg Oral Daily Rust-Chester, Britton L, NP   40 mg at 08/04/24 2212   Chlorhexidine  Gluconate Cloth 2 % PADS 6 each  6 each Topical Daily Aleskerov, Fuad, MD   6 each at 08/05/24 0932   citalopram  (CELEXA ) tablet 20 mg  20 mg Oral Daily  Rust-Chester, Britton L, NP   20 mg at 08/05/24 0932   enoxaparin  (LOVENOX ) injection 40 mg  40 mg Subcutaneous Q24H Kandis Devaughn Sayres, MD   40 mg at 08/04/24 2316   furosemide  (LASIX ) tablet 40 mg  40 mg Oral Daily Kandis Devaughn Sayres, MD   40 mg at 08/05/24 0932   hydrALAZINE  (APRESOLINE ) injection 10 mg  10 mg Intravenous Q4H PRN Bousman, Karlie, PA-C   10 mg at 08/01/24 1434   methocarbamol  (ROBAXIN ) tablet 500 mg  500 mg Oral BID Rust-Chester, Britton L, NP   500 mg at 08/05/24 0932   Oral care mouth rinse  15 mL Mouth Rinse PRN Aleskerov, Fuad, MD       pantoprazole  (PROTONIX ) EC tablet 40 mg  40 mg Oral Daily Kandis Devaughn Sayres, MD   40 mg at 08/05/24 0932   QUEtiapine  (SEROQUEL ) tablet 50 mg  50 mg Oral QHS Rust-Chester, Britton L, NP   50 mg at 08/04/24 2212   rifaximin  (XIFAXAN ) tablet 550 mg  550 mg Oral BID Nelson, Dana G, NP   550 mg at 08/05/24 0932   zinc  sulfate (50mg  elemental  zinc ) capsule 220 mg  220 mg Oral Daily Nelson, Dana G, NP   220 mg at 08/05/24 0932     Discharge Medications: Please see discharge summary for a list of discharge medications.  Relevant Imaging Results:  Relevant Lab Results:   Additional Information SS# [id number]  Nathanael CHRISTELLA Ring, RN

## 2024-08-05 NOTE — Discharge Summary (Deleted)
 [name]

## 2024-08-05 NOTE — TOC Transition Note (Signed)
 Transition of Care [facility]) - Discharge Note   Patient Details  Name: [name] MRN: [id number] Date of Birth: 1950/11/26  [facility]) CM/SW Contact:  Drew CHRISTELLA Ring, RN Phone Number: 08/05/2024, 4:02 PM   Clinical Narrative:    Patient will be going to [facility] room 27 B.  Bedside RN will call report to [phone].  CM has arranged transport via [facility].  They are scheduled for around 5:30 PM.     Final next level of care: Skilled Nursing Facility Barriers to Discharge: Barriers Resolved   Patient Goals and CMS Choice   CMS Medicare.gov Compare Post Acute Care list provided to:: Patient        Discharge Placement PASRR number recieved: 08/05/24            Patient chooses bed at: [facility] Patient to be transferred to facility by: [facility] emergent [facility] Name of family member notified: [name] Later ALF Patient and family notified of of transfer: 08/05/24  Discharge Plan and Services Additional resources added to the After Visit Summary for     Discharge Planning Services: CM Consult Post Acute Care Choice: Skilled Nursing Facility                               Social Drivers of Health (SDOH) Interventions SDOH Screenings   Food Insecurity: Patient Unable To Answer (08/01/2024)  Housing: Low Risk  (06/29/2024)  Transportation Needs: No Transportation Needs (06/28/2024)  Utilities: Not At Risk (06/28/2024)  Alcohol Screen: Low Risk  (09/24/2022)  Depression (PHQ2-9): High Risk (04/26/2022)  Financial Resource Strain: High Risk (04/27/2022)  Physical Activity: Sufficiently Active (04/16/2018)  Social Connections: Socially Isolated (06/28/2024)  Stress: Stress Concern Present (04/16/2018)  Tobacco Use: High Risk (07/31/2024)     Readmission Risk Interventions    11/29/2023    5:03 PM 11/13/2023    2:21 PM 11/12/2023    4:00 PM  Readmission Risk Prevention Plan  Transportation Screening Not Complete  Not Complete   Medication Review Oceanographer) Not Complete Complete Not Complete  PCP or Specialist appointment within 3-5 days of discharge Not Complete Complete Complete  HRI or Home Care Consult Not Complete Complete   SW Recovery Care/Counseling Consult Not Complete Complete   Palliative Care Screening Not Applicable Complete   Skilled Nursing Facility Not Applicable Not Applicable

## 2024-08-05 NOTE — Plan of Care (Signed)
  Problem: Activity: Goal: Ability to tolerate increased activity will improve Outcome: Progressing   Problem: Respiratory: Goal: Ability to maintain a clear airway and adequate ventilation will improve Outcome: Progressing   Problem: Role Relationship: Goal: Method of communication will improve Outcome: Progressing   Problem: Education: Goal: Knowledge of General Education information will improve Description: Including pain rating scale, medication(s)/side effects and non-pharmacologic comfort measures Outcome: Progressing   Problem: Health Behavior/Discharge Planning: Goal: Ability to manage health-related needs will improve Outcome: Progressing   Problem: Clinical Measurements: Goal: Ability to maintain clinical measurements within normal limits will improve Outcome: Progressing Goal: Will remain free from infection Outcome: Progressing Goal: Diagnostic test results will improve Outcome: Progressing Goal: Respiratory complications will improve Outcome: Progressing Goal: Cardiovascular complication will be avoided Outcome: Progressing   Problem: Activity: Goal: Risk for activity intolerance will decrease Outcome: Progressing   Problem: Nutrition: Goal: Adequate nutrition will be maintained Outcome: Progressing   Problem: Coping: Goal: Level of anxiety will decrease Outcome: Progressing   Problem: Elimination: Goal: Will not experience complications related to bowel motility Outcome: Progressing Goal: Will not experience complications related to urinary retention Outcome: Progressing   Problem: Pain Managment: Goal: General experience of comfort will improve and/or be controlled Outcome: Progressing   Problem: Safety: Goal: Ability to remain free from injury will improve Outcome: Progressing   Problem: Skin Integrity: Goal: Risk for impaired skin integrity will decrease Outcome: Progressing

## 2024-08-05 NOTE — Care Management Important Message (Signed)
 Important Message  Patient Details  Name: [name] MRN: [id number] Date of Birth: May 14, 1951   Important Message Given:  Yes - Medicare IM     Radwan Cowley W, CMA 08/05/2024, 12:32 PM

## 2024-08-05 NOTE — TOC Progression Note (Signed)
 Transition of Care [facility]) - Progression Note    Patient Details  Name: [name] MRN: [id number] Date of Birth: 26-Feb-1951  [facility]) CM/SW Contact  Nathanael CHRISTELLA Ring, RN Phone Number: 08/05/2024, 4:33 PM  Clinical Narrative:     [facility] just now called and notified me that they will not accept patient because they found out he is on the sex offender registry.  Canceled transport and notified provider and RN.   Expected Discharge Plan: Skilled Nursing Facility Barriers to Discharge: Barriers Resolved               Expected Discharge Plan and Services   Discharge Planning Services: CM Consult Post Acute Care Choice: Skilled Nursing Facility Living arrangements for the past 2 months: Assisted Living Facility Expected Discharge Date: 08/05/24                                     Social Drivers of Health (SDOH) Interventions SDOH Screenings   Food Insecurity: Patient Unable To Answer (08/01/2024)  Housing: Low Risk  (06/29/2024)  Transportation Needs: No Transportation Needs (06/28/2024)  Utilities: Not At Risk (06/28/2024)  Alcohol Screen: Low Risk  (09/24/2022)  Depression (PHQ2-9): High Risk (04/26/2022)  Financial Resource Strain: High Risk (04/27/2022)  Physical Activity: Sufficiently Active (04/16/2018)  Social Connections: Socially Isolated (06/28/2024)  Stress: Stress Concern Present (04/16/2018)  Tobacco Use: High Risk (07/31/2024)    Readmission Risk Interventions    11/29/2023    5:03 PM 11/13/2023    2:21 PM 11/12/2023    4:00 PM  Readmission Risk Prevention Plan  Transportation Screening Not Complete  Not Complete  Medication Review Oceanographer) Not Complete Complete Not Complete  PCP or Specialist appointment within 3-5 days of discharge Not Complete Complete Complete  HRI or Home Care Consult Not Complete Complete   SW Recovery Care/Counseling Consult Not Complete Complete   Palliative Care Screening Not Applicable Complete    Skilled Nursing Facility Not Applicable Not Applicable

## 2024-08-05 NOTE — TOC Progression Note (Signed)
 Transition of Care [facility]) - Progression Note    Patient Details  Name: [name] MRN: [id number] Date of Birth: [birth year]  Transition [facility]) CM/SW Contact  Nathanael CHRISTELLA Ring, RN Phone Number: 08/05/2024, 1:59 PM  Clinical Narrative:    Insurance authorization approved Auth ID [id number] good from 12/10-12/12 next review 12/12.  [facility] notified and Provider notified to start working on Discharge orders.     Expected Discharge Plan: Skilled Nursing Facility Barriers to Discharge: Continued Medical Work up               Expected Discharge Plan and Services   Discharge Planning Services: CM Consult Post Acute Care Choice: Skilled Nursing Facility Living arrangements for the past 2 months: Assisted Living Facility                                       Social Drivers of Health (SDOH) Interventions SDOH Screenings   Food Insecurity: Patient Unable To Answer (08/01/2024)  Housing: Low Risk  (06/29/2024)  Transportation Needs: No Transportation Needs (06/28/2024)  Utilities: Not At Risk (06/28/2024)  Alcohol Screen: Low Risk  (09/24/2022)  Depression (PHQ2-9): High Risk (04/26/2022)  Financial Resource Strain: High Risk (04/27/2022)  Physical Activity: Sufficiently Active (04/16/2018)  Social Connections: Socially Isolated (06/28/2024)  Stress: Stress Concern Present (04/16/2018)  Tobacco Use: High Risk (07/31/2024)    Readmission Risk Interventions    11/29/2023    5:03 PM 11/13/2023    2:21 PM 11/12/2023    4:00 PM  Readmission Risk Prevention Plan  Transportation Screening Not Complete  Not Complete  Medication Review Oceanographer) Not Complete Complete Not Complete  PCP or Specialist appointment within 3-5 days of discharge Not Complete Complete Complete  HRI or Home Care Consult Not Complete Complete   SW Recovery Care/Counseling Consult Not Complete Complete   Palliative Care Screening Not Applicable Complete   Skilled Nursing Facility Not  Applicable Not Applicable

## 2024-08-05 NOTE — TOC Progression Note (Signed)
 Transition of Care [facility]) - Progression Note    Patient Details  Name: [name] MRN: [id number] Date of Birth: [birth year]  Transition of Care [facility]) CM/SW Contact  Nathanael CHRISTELLA Ring, RN Phone Number: 08/05/2024, 1:27 PM  Clinical Narrative:    [facility] offered a bed and can accept him as soon as insurance auth approved.  [name], Group home owner says yes to [facility].  Provider says that patient is medically stable for discharge today, insurance auth started.    Expected Discharge Plan: Skilled Nursing Facility Barriers to Discharge: Continued Medical Work up               Expected Discharge Plan and Services   Discharge Planning Services: CM Consult Post Acute Care Choice: Skilled Nursing Facility Living arrangements for the past 2 months: Assisted Living Facility                                       Social Drivers of Health (SDOH) Interventions SDOH Screenings   Food Insecurity: Patient Unable To Answer (08/01/2024)  Housing: Low Risk  (06/29/2024)  Transportation Needs: No Transportation Needs (06/28/2024)  Utilities: Not At Risk (06/28/2024)  Alcohol Screen: Low Risk  (09/24/2022)  Depression (PHQ2-9): High Risk (04/26/2022)  Financial Resource Strain: High Risk (04/27/2022)  Physical Activity: Sufficiently Active (04/16/2018)  Social Connections: Socially Isolated (06/28/2024)  Stress: Stress Concern Present (04/16/2018)  Tobacco Use: High Risk (07/31/2024)    Readmission Risk Interventions    11/29/2023    5:03 PM 11/13/2023    2:21 PM 11/12/2023    4:00 PM  Readmission Risk Prevention Plan  Transportation Screening Not Complete  Not Complete  Medication Review Oceanographer) Not Complete Complete Not Complete  PCP or Specialist appointment within 3-5 days of discharge Not Complete Complete Complete  HRI or Home Care Consult Not Complete Complete   SW Recovery Care/Counseling Consult Not Complete Complete    Palliative Care Screening Not Applicable Complete   Skilled Nursing Facility Not Applicable Not Applicable

## 2024-08-06 LAB — GLUCOSE, CAPILLARY
Glucose-Capillary: 117 mg/dL — ABNORMAL HIGH (ref 70–99)
Glucose-Capillary: 68 mg/dL — ABNORMAL LOW (ref 70–99)
Glucose-Capillary: 72 mg/dL (ref 70–99)
Glucose-Capillary: 96 mg/dL (ref 70–99)
Glucose-Capillary: 98 mg/dL (ref 70–99)

## 2024-08-06 MED ORDER — LACTULOSE 10 GM/15ML PO SOLN
20.0000 g | Freq: Three times a day (TID) | ORAL | Status: DC
  Administered 2024-08-06: 20 g via ORAL
  Filled 2024-08-06: qty 30

## 2024-08-06 NOTE — Plan of Care (Signed)
  Problem: Activity: Goal: Ability to tolerate increased activity will improve Outcome: Progressing   Problem: Respiratory: Goal: Ability to maintain a clear airway and adequate ventilation will improve Outcome: Progressing   Problem: Role Relationship: Goal: Method of communication will improve Outcome: Progressing   Problem: Education: Goal: Knowledge of General Education information will improve Description: Including pain rating scale, medication(s)/side effects and non-pharmacologic comfort measures Outcome: Progressing   Problem: Health Behavior/Discharge Planning: Goal: Ability to manage health-related needs will improve Outcome: Progressing   Problem: Clinical Measurements: Goal: Ability to maintain clinical measurements within normal limits will improve Outcome: Progressing Goal: Will remain free from infection Outcome: Progressing Goal: Diagnostic test results will improve Outcome: Progressing Goal: Respiratory complications will improve Outcome: Progressing Goal: Cardiovascular complication will be avoided Outcome: Progressing   Problem: Activity: Goal: Risk for activity intolerance will decrease Outcome: Progressing   Problem: Nutrition: Goal: Adequate nutrition will be maintained Outcome: Progressing   Problem: Coping: Goal: Level of anxiety will decrease Outcome: Progressing   Problem: Elimination: Goal: Will not experience complications related to bowel motility Outcome: Progressing Goal: Will not experience complications related to urinary retention Outcome: Progressing   Problem: Pain Managment: Goal: General experience of comfort will improve and/or be controlled Outcome: Progressing   Problem: Safety: Goal: Ability to remain free from injury will improve Outcome: Progressing   Problem: Skin Integrity: Goal: Risk for impaired skin integrity will decrease Outcome: Progressing

## 2024-08-06 NOTE — [place] FL2 (Signed)
 [place]  MEDICAID FL2 LEVEL OF CARE FORM     IDENTIFICATION  Patient Name: [name] Birthdate: 1950-10-07 Sex: male Admission Date (Current Location): 07/31/2024  [place] and [place] Number:  Chiropodist and Address:  [facility], [address], [place], [place] [address]      Provider Number: [id number]  Attending Physician Name and Address:  Jerelene Critchley, MD  Relative Name and Phone Number:  [name] Later- groALF woner- [phone]    Current Level of Care: Hospital Recommended Level of Care: Assisted Living Facility Prior Approval Number:    Date Approved/Denied:   PASRR Number: [id number]  Discharge Plan: Other (Comment) (ALF)    Current Diagnoses: Patient Active Problem List   Diagnosis Date Noted   Iron deficiency anemia 07/03/2024   Gram-negative bacteremia 06/30/2024   Sepsis due to Gram-negative organism with acute renal failure (HCC) 06/27/2024   Hyponatremia 06/27/2024   Acute on chronic anemia 06/27/2024   Dyslipidemia 06/27/2024   Peripheral neuropathy 06/27/2024   GERD without esophagitis 06/27/2024   Bacteremia 02/18/2024   Complicated UTI (urinary tract infection) 02/17/2024   Chronic indwelling Foley catheter 01/03/2024   Hypomagnesemia 12/30/2023   Depression with anxiety 12/30/2023   Altered mental status 11/12/2023   Chronic kidney disease, stage 3a (HCC) 10/21/2023   Myocardial injury 10/21/2023   CKD stage 3a, GFR 45-59 ml/min (HCC) - baseline scr 1.3 10/01/2023   Hepatic encephalopathy (HCC) 09/30/2023   Liver cirrhosis (HCC) 09/30/2023   Essential hypertension 09/30/2023   Diabetes mellitus type 2, noninsulin dependent (HCC) 09/30/2023   CAD (coronary artery disease) 09/30/2023   Hyperlipidemia 09/30/2023   Hepatic encephalopathy (HCC) 08/01/2023   Hyperammonemia 07/30/2023   Cervical spinal stenosis 07/01/2023   Cervical myelopathy (HCC) 06/21/2023   Cervical stenosis of spine  06/21/2023   Cervical radiculopathy 06/20/2023   E coli bacteremia 06/17/2023   UTI (urinary tract infection) 06/17/2023   Left leg cellulitis 03/23/2023   Leukocytosis 03/23/2023   Left leg pain 03/23/2023   Suicidal ideation 01/14/2023   Acute pain 01/14/2023   Adrenal insufficiency 12/26/2022   Orthostatic hypotension 12/24/2022   Rib pain on right side 12/24/2022   Hepatitis B infection 12/24/2022   Myocardial ischemia 12/24/2022   Acute appendicitis 11/05/2022   Pancytopenia (HCC) 11/05/2022   Sinus bradycardia 11/05/2022   Acute metabolic encephalopathy 09/22/2022   Liver lesion 09/22/2022   Obesity (BMI 30-39.9) 09/22/2022   Acute hepatic encephalopathy (HCC) 09/22/2022   MDD (major depressive disorder), recurrent severe, 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   Major depressive disorder, 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 psychotic features (HCC) 09/15/2022   Protein-calorie malnutrition, severe 08/31/2022   Hypotension 08/31/2022   Weakness    Hx of transient ischemic attack (TIA) 04/28/2022   Obesity    Chronic diastolic CHF (congestive heart failure) (HCC)    Depression    Septic shock (HCC) 03/28/2022   Bacteremia due to Klebsiella pneumoniae 03/28/2022   Hypoglycemia 03/28/2022   Syncope 03/24/2022   Hypokalemia 03/24/2022   Fever 03/24/2022   COPD (chronic obstructive pulmonary disease) (HCC)    Elevated troponin    Abnormal LFTs    History of hepatitis C    AKI (acute kidney injury)    Pure hypercholesterolemia    Obesity, Class III, BMI 40-49.9 (morbid obesity) (HCC) 04/18/2020   Thrombocytopenia 04/17/2020   Leukopenia 04/17/2020   HTN (hypertension) 04/17/2018   Uncontrolled type 2 diabetes mellitus with hyperglycemia, without long-term current use of insulin  (HCC) 04/17/2018   Lymphedema  04/17/2018   Normocytic anemia 04/07/2018   Chest pain 03/20/2018   Unsheltered homelessness 12/16/2017    Orientation RESPIRATION BLADDER Height & Weight     Self, Time,  Situation, Place  Normal Indwelling catheter Weight: 93.7 kg Height:  5' 6 (167.6 cm)  BEHAVIORAL SYMPTOMS/MOOD NEUROLOGICAL BOWEL NUTRITION STATUS      Continent Diet (Regular)  AMBULATORY STATUS COMMUNICATION OF NEEDS Skin   Independent Verbally Bruising                       Personal Care Assistance Level of Assistance  Bathing, Feeding, Dressing Bathing Assistance: Limited assistance Feeding assistance: Independent Dressing Assistance: Limited assistance     Functional Limitations Info  Sight, Hearing, Speech Sight Info: Adequate Hearing Info: Adequate Speech Info: Adequate    SPECIAL CARE FACTORS FREQUENCY  PT (By licensed PT), OT (By licensed OT)     PT Frequency: 2-3 times per week OT Frequency: 2-3 times per week            Contractures Contractures Info: Not present    Additional Factors Info  Code Status, Allergies Code Status Info: Full Allergies Info: Bee Venom High  Anaphylaxis   Codeine  High Allergy Itching Only itching. Recently allergy tested at [facility]  Rsv Mrna Pre-f Virus Vaccine High  Swelling   Meloxicam Medium   Other Reaction(s): ?overdose of local given at dentist--had trouble breathing  Novocain (procaine) Not Specified     Sulfa Antibiotics Not Specified   hallucinations           Current Medications (08/06/2024):  This is the current hospital active medication list Current Facility-Administered Medications  Medication Dose Route Frequency Provider Last Rate Last Admin   acetaminophen  (TYLENOL ) tablet 325 mg  325 mg Oral Q6H PRN Kandis Devaughn Sayres, MD   325 mg at 08/05/24 1449   aspirin  chewable tablet 81 mg  81 mg Oral Daily Kandis Devaughn Sayres, MD   81 mg at 08/06/24 9196   atorvastatin  (LIPITOR) tablet 40 mg  40 mg Oral Daily Rust-Chester, Britton L, NP   40 mg at 08/05/24 2229   calcium  carbonate (TUMS - dosed in mg elemental calcium ) chewable tablet 200 mg of elemental calcium   1 tablet Oral Daily PRN Ponnala, Shruthi, MD   200 mg of  elemental calcium  at 08/05/24 1502   Chlorhexidine  Gluconate Cloth 2 % PADS 6 each  6 each Topical Daily Aleskerov, Fuad, MD   6 each at 08/06/24 0806   citalopram  (CELEXA ) tablet 20 mg  20 mg Oral Daily Rust-Chester, Britton L, NP   20 mg at 08/06/24 9196   enoxaparin  (LOVENOX ) injection 40 mg  40 mg Subcutaneous Q24H Kandis Devaughn Sayres, MD   40 mg at 08/05/24 2236   furosemide  (LASIX ) tablet 40 mg  40 mg Oral Daily Kandis Devaughn Sayres, MD   40 mg at 08/06/24 9196   gabapentin  (NEURONTIN ) capsule 300 mg  300 mg Oral BID Ponnala, Shruthi, MD   300 mg at 08/06/24 9196   hydrALAZINE  (APRESOLINE ) injection 10 mg  10 mg Intravenous Q4H PRN Bousman, Karlie, PA-C   10 mg at 08/01/24 1434   lactulose  (CHRONULAC ) 10 GM/15ML solution 20 g  20 g Oral TID Ponnala, Shruthi, MD       methocarbamol  (ROBAXIN ) tablet 500 mg  500 mg Oral BID Rust-Chester, Britton L, NP   500 mg at 08/06/24 0803   Oral care mouth rinse  15 mL Mouth Rinse PRN Aleskerov, Fuad,  MD       pantoprazole  (PROTONIX ) EC tablet 40 mg  40 mg Oral Daily Kandis Devaughn Sayres, MD   40 mg at 08/06/24 9196   QUEtiapine  (SEROQUEL ) tablet 50 mg  50 mg Oral QHS Rust-Chester, Britton L, NP   50 mg at 08/05/24 2229   rifaximin  (XIFAXAN ) tablet 550 mg  550 mg Oral BID Nelson, Dana G, NP   550 mg at 08/06/24 0803   spironolactone  (ALDACTONE ) tablet 12.5 mg  12.5 mg Oral Daily Ponnala, Shruthi, MD   12.5 mg at 08/06/24 9196   zinc  sulfate (50mg  elemental zinc ) capsule 220 mg  220 mg Oral Daily Nelson, Dana G, NP   220 mg at 08/06/24 0803     Discharge Medications: Please see discharge summary for a list of discharge medications.  Relevant Imaging Results:  Relevant Lab Results:   Additional Information SS# [id number]  Nathanael CHRISTELLA Ring, RN

## 2024-08-06 NOTE — Progress Notes (Signed)
 Speech Language Pathology Treatment: Dysphagia  Patient Details Name: [name] MRN: [id number] DOB: 1951-07-23 Today's Date: 08/06/2024 Time: 1135-1205 SLP Time Calculation (min) (ACUTE ONLY): 30 min  Assessment / Plan / Recommendation Clinical Impression  Pt was seen for ongoing assessment of swallowing; toleration of diet and trials to upgrade diet if able to today. Pt is awake, verbally responded and followed basic 1step directions w/ cues. Pt's speech was intelligible - Edentulous. Pt required support w/ setup for self-feeding of drink and verbal cues to drink slowly. Unsure of pt's Baseline Cognitive functioning. Per MD note, pt is improving but remains encephalopathic.  Noted per pt's chart history, he has had MULTIPLE admits in recent months. Pt appears Deconditioned. On RA-2L, afebrile. WBC low.   Pt appears to present w/ grossly functional oropharyngeal phase swallowing w/ trials of thin liquids assessed at this session today for diet upgrade to Dysphagia level 2(MINCED) foods, thin liquids. Pt has been on this consistency diet in the past; Discharged on this diet. No overt pharyngeal phase swallowing deficits w/ trials. ANY Cognitive decline can impact overall awareness/timing of swallow and safety during po tasks which increases risk for aspiration, choking. Pt is also Edentulous at Baseline. Pt's risk for aspiration appears reduced when following general aspiration precautions including feeding support and when using a modified diet consistency of Puree foods. thin liquids.      Pt assisted into an Upright position for po trials -- he needed MOD+ support d/t weakness and Cognition. Pt fed himself trials of thins by straw (verbal cue to take Small sips Slowly) and minced, moistened foods. No overt clinical s/s of aspiration noted: no decline in vocal quality, no cough, and no decline in respiratory status during/post trials. Oral phase was adequate for bolus management and oral  clearing of the boluses given.  Education given on need to SLOW DOWN and clear mouth b/t each bite. Pt followed instructions w/ fair-good accuracy w/ verbal cues. Suspect decreased insight into risk for aspiration when eating/drinking Impulsively.    In setting of overall medical presentation today, Edentulous status, and previously recommended oral diet consistencies at admits, recommend upgrade to the Dysphagia level 2(MINCED foods) moistened for ease of mashing/gumming and flavor; Thin liquids via Small sips Slowly. General aspiration precautions; reduce Distractions during meals and engage pt during meals for self-feeding. Monitor for Impulsive feeding behaviors and oral clearing b/t bites. Pills Whole vs Crushed vs whole in Puree for safer swallowing. Support/Supervision at meals; monitoring for needs. MD/NSG updated.    Recommend follow w/ Palliative Care and Dietician for support at Discharge. Suspect pt is close to/at his baseline. Education provided to pt/NSG re: diet consistency and precautions. Precautions posted in room, chart.       HPI HPI: Pt is a 73 y/o male with a history of MULTIPLE visits to the Hospital/ED and MULTIPLE medical dxs including T2DM, HTN, COPD, migraines, hepatitis C, w/ likely underlying HCC, HBV, Cirrhosis, TIA, depression, anxiety, PTSD, Hernia, GERD, obstructive sleep apnea, cervical stenosis status post C3-6 ACDF, hyperlipidemia, CKD, chronic foley, CAD, and chronic Heart Failure.  He has had multiple ED visits mostly due to urology complaints.  Recent Discharge summary from 07/03/2024.  Patient presented with fever and lethargy: found to have sepsis secondary to bacteremia from a Klebsiella UTI in the setting of an indwelling Foley.  Was also noted to have acute hepatic encephalopathy that improved with lactulose  and rifaximin ..  At this admit from his [facility] where he was found unresponsive,  there was concern for hepatic encephalopathy and sepsis d/t UTI.   Patient did have worsening mental status after returning from CT and decision was made to proceed with intubation as above.  He was placed on propofol  for postintubation sedation.  Pt was extubated on 12/6, ~1 day later.  Chest Imaging this admit:  No focal airspace consolidation, pleural effusion, or pneumothorax.  Mild cardiomegaly. Tortuous aorta with aortic atherosclerosis. No  acute fracture or destructive lesions. Multilevel thoracic  osteophytosis. Cervical fusion hardware.  Head CT: No acute traumatic injury identified.  2. Stable and normal for age non-contrast CT appearance of the brain.      SLP Plan  All goals met        Swallow Evaluation Recommendations   Recommendations: PO diet PO Diet Recommendation: Dysphagia 2 (Finely chopped);Thin liquids (Level 0) (added purees) Liquid Administration via: Cup;Straw Medication Administration: Whole meds with puree (vs crushed in puree) Supervision: Patient able to self-feed;Intermittent supervision/cueing for swallowing strategies;Set-up assistance for safety Postural changes: Position pt fully upright for meals;Stay upright 30-60 min after meals;Out of bed for meals Oral care recommendations: Oral care BID (2x/day);Pt independent with oral care (setup) Recommended consults: Consider dietitian consultation;Consider Palliative care     Recommendations   No expected ST needs at Discharge                  Oral care BID;Staff/trained caregiver to provide oral care   Intermittent Supervision/Assistance Dysphagia, unspecified (R13.10) (baseline Dysphagia previously(oral); Edentulous; deconditioning/illness/hospitalization; need for support at meals/w/ feeding; unsure of Cognitive fx at Baseline)     All goals met        Drew Portugal, MS, CCC-SLP Speech Language Pathologist Rehab Services; [facility] [phone] (ascom) [name]  08/06/2024, 11:33 AM

## 2024-08-06 NOTE — Discharge Summary (Signed)
 Physician Discharge Summary   Patient: [name] MRN: [id number] DOB: May 27, 1951  Admit date:     07/31/2024  Discharge date: 08/06/2024  Discharge Physician: Laree Lock   PCP: [facility]   Recommendations at discharge:   Follow-up with SNF provider -repeat CMP, CBC in 1 to 2-weeks  Discharge Diagnoses: Principal Problem:   Hepatic encephalopathy (HCC) Active Problems:   Liver cirrhosis (HCC)   Diabetes mellitus type 2, noninsulin dependent (HCC)   Essential hypertension   CAD (coronary artery disease)   Chronic kidney disease, stage 3a (HCC)   Chronic diastolic CHF (congestive heart failure) (HCC)   COPD (chronic obstructive pulmonary disease) (HCC)   History of hepatitis C   Obesity   Hepatitis B infection   Adrenal insufficiency   Chronic indwelling Foley catheter   Hospital Course: [name] is a 73 year old male with a past medical history significant for hepatic encephalopathy, COPD, CHF, hypertension, type 2 diabetes mellitus who presented to [facility] ED on 07/31/2024 for evaluation of altered mental status. On presentation, Patient was intubated and sedated. It is reported that he was sent from his group home after being found unresponsive, with his last known well time occurring when he went to bed last night. Hospital course as below Patient discharged to group home with Home PT/OT  Assessment and Plan: Hepatic encephalopathy - resolved Cirrhosis S/p intubation for airway protection. No ascites on 12/5 ct - cont home rifaximin , Lactulose    Debility Lives in a group home - PT rec SNF, but was able to return to group home with home PT/OT   Dysphagia Seen by SLP, cleared for dysphagia 2 diet   Pyuria Chronic indwelling foley discontinue foley while encephalopathic and it has been replaced. No fevers or overt signs of uti. Culture shows mixed species. Has been on ceftriaxone  here - d/c abx and monitor   Neuropathy - Resume  Gabapentin  12/10   CAD Asymptomatic - home asa, statin   CKD 2/3a At baseline   HFpEF Appears euvolemic, kidney function at baseline - continue home diuretics   MDD - home celexa , seroquel    Obesity Class I - Weight loss and Lifestyle modification   Disposition: Group Home with Home PT/OT Diet recommendation:  Discharge Diet Orders (From admission, onward)     Start     Ordered   08/06/24 0000  DIET DYS 2 Room service appropriate? Yes with Assist; Fluid consistency: Thin       Comments: Extra Gravy on meats, potatoes.  Question:  Fluid consistency:  Answer:  Thin   08/06/24 1113   08/05/24 0000  Diet - low sodium heart healthy       Comments: Dysphagia Type 2 diet   08/05/24 1438           DISCHARGE MEDICATION: Allergies as of 08/06/2024       Reactions   Bee Venom Anaphylaxis   Codeine  Itching   Only itching. Recently allergy [facility] at [facility]   Rsv Mrna Pre-f Virus Vaccine Swelling   Meloxicam    Other Reaction(s): ?overdose of local given at dentist--had trouble breathing   Novocain [procaine]    Sulfa Antibiotics    hallucinations        Medication List     STOP taking these medications    cefdinir  300 MG capsule Commonly known as: OMNICEF    ondansetron  8 MG disintegrating tablet Commonly known as: ZOFRAN -ODT       TAKE these medications    acetaminophen  650  MG CR tablet Commonly known as: TYLENOL  Take 650 mg by mouth every 8 (eight) hours as needed for pain.   aspirin  81 MG chewable tablet Chew 81 mg by mouth daily.   atorvastatin  40 MG tablet Commonly known as: LIPITOR Take 40 mg by mouth daily.   benzocaine  10 % mucosal gel Commonly known as: ORAJEL Apply to the tip of the penis for penile pain up to four times daily prn for penile pain   citalopram  20 MG tablet Commonly known as: CELEXA  Take 20 mg by mouth daily.   feeding supplement Liqd Take 237 mLs by mouth 3 (three) times daily between meals.   furosemide  40 MG  tablet Commonly known as: LASIX  Take 40 mg by mouth daily.   gabapentin  300 MG capsule Commonly known as: NEURONTIN  Take 300 mg by mouth 2 (two) times daily.   lactulose  10 GM/15ML solution Commonly known as: CHRONULAC  Take 67.5 mLs (45 g total) by mouth 3 (three) times daily. Please titrate to have 2-3 soft bowel movements daily   methocarbamol  500 MG tablet Commonly known as: ROBAXIN  Take 1 tablet (500 mg total) by mouth every 8 (eight) hours as needed for muscle spasms. What changed:  when to take this reasons to take this   multivitamin with minerals Tabs tablet Take 1 tablet by mouth daily.   nitroGLYCERIN  0.4 MG/SPRAY spray Commonly known as: NITROLINGUAL  Place 1 spray under the tongue every 5 (five) minutes x 3 doses as needed for chest pain.   ondansetron  4 MG tablet Commonly known as: ZOFRAN  Take 1 tablet (4 mg total) by mouth every 6 (six) hours as needed for nausea.   pantoprazole  40 MG tablet Commonly known as: PROTONIX  Take 40 mg by mouth daily.   phenol 1.4 % Liqd Commonly known as: CHLORASEPTIC Use as directed 1 spray in the mouth or throat as needed for throat irritation / pain.   QUEtiapine  50 MG tablet Commonly known as: SEROQUEL  Take 50 mg by mouth at bedtime.   senna 8.6 MG Tabs tablet Commonly known as: SENOKOT Take 1 tablet (8.6 mg total) by mouth 2 (two) times daily as needed for mild constipation.   spironolactone  25 MG tablet Commonly known as: ALDACTONE  Take 0.5 tablets (12.5 mg total) by mouth daily.   Ventolin  HFA 108 (90 Base) MCG/ACT inhaler Generic drug: albuterol  Inhale 2 puffs into the lungs every 4 (four) hours as needed for wheezing.   albuterol  (2.5 MG/3ML) 0.083% nebulizer solution Commonly known as: PROVENTIL  Take 2.5 mg by nebulization every 4 (four) hours as needed for wheezing or shortness of breath.   Xifaxan  550 MG Tabs tablet Generic drug: rifaximin  Take 550 mg by mouth 2 (two) times daily.        Discharge  Exam: Filed Weights   08/04/24 0506 08/05/24 0439 08/06/24 0501  Weight: 93 kg 94.5 kg 93.7 kg   General exam: Appears calm and comfortable  Respiratory system: Clear to auscultation. Respiratory effort normal. Cardiovascular system: S1 & S2 heard, RRR.   Gastrointestinal system: Abdomen is mildly distended, soft and nontender.   Central nervous system: Alert and oriented to self, moving all 4 Extremities: Symmetric 5 x 5 power. No edema Skin: No rashes, lesions or ulcers Psychiatry: calm  Condition at discharge: fair  The results of significant diagnostics from this hospitalization (including imaging, microbiology, ancillary and laboratory) are listed below for reference.   Imaging Studies: DG Chest Portable 1 View Result Date: 07/31/2024 CLINICAL DATA:  verify tube placement EXAM:  PORTABLE CHEST - 1 VIEW COMPARISON:  July 31, 2024 FINDINGS: Interval placement of an esophagogastric tube, which terminates in the stomach. Endotracheal tube has been placed and terminates in the mid trachea. Low lung volumes. No focal airspace consolidation, pleural effusion, or pneumothorax. Mild cardiomegaly. Tortuous aorta with aortic atherosclerosis. Multilevel thoracic osteophytosis. Right upper quadrant surgical clips. IMPRESSION: 1. Well-positioned endotracheal and esophagogastric tubes. No pneumothorax. 2. Low lung volumes, without significant interval change to the lungs. Electronically Signed   By: Rogelia Myers M.D.   On: 07/31/2024 10:42   CT ABDOMEN PELVIS WO CONTRAST Result Date: 07/31/2024 EXAM: CT ABDOMEN AND PELVIS WITHOUT CONTRAST 07/31/2024 09:28:22 AM TECHNIQUE: CT of the abdomen and pelvis was performed without the administration of intravenous contrast. Multiplanar reformatted images are provided for review. Automated exposure control, iterative reconstruction, and/or weight-based adjustment of the mA/kV was utilized to reduce the radiation dose to as low as reasonably achievable.  COMPARISON: CT abdomen and pelvis Apr 21, 2024. CLINICAL HISTORY: 73 year old male. Abdominal pain, acute, nonlocalized. FINDINGS: LOWER CHEST: No pericardial or pleural effusion. Lower lung volumes, mild respiratory motion and increased patchy bibasilar opacity which most resembles atelectasis. LIVER: Nodular, cirrhotic liver. GALLBLADDER AND BILE DUCTS: Chronic cholecystectomy. Chronic bile duct dilatation is stable. SPLEEN: Stable mild splenomegaly. PANCREAS: No acute abnormality. ADRENAL GLANDS: No acute abnormality. KIDNEYS, URETERS AND BLADDER: Chronic asymmetric renal atrophy and nephrolithiasis. Foley catheter in place, positioning appears appropriate and the urinary bladder is decompressed. Underlying bladder wall thickening, and mild inflammatory stranding along the bladder wall (series 7 image 79) although less pronounced compared to Apr 21, 2024. Suprapubic catheter has been removed since 21-Apr-2024. No hydronephrosis. No perinephric or periureteral stranding. GI AND BOWEL: Small volume retained fluid in the stomach. Decompressed duodenum. Cecum is on a lax mesentery located partially in the right upper quadrant. Appendix not identified. Fluid throughout nondilated large and small bowel loops suspicious for acute enteritis / diarrhea. Layering fluid level in the rectum. No focal bowel inflammation identified. There is no bowel obstruction. PERITONEUM AND RETROPERITONEUM: No ascites. No free air. VASCULATURE: Aorta is normal in caliber. Mild for age aortoiliac calcified atherosclerosis. LYMPH NODES: No lymphadenopathy. REPRODUCTIVE ORGANS: No acute abnormality. BONES AND SOFT TISSUES: Chronic spine degeneration, especially lumbar facet arthropathy. Stable postoperative chronic left lateral ventral abdominal wall hernia now contains only mesentery on series 7 image 47, plus a small volume of free fluid within the hernia sac (series 7 image 49). No regional hernia. No herniated bowel today. No acute osseous  abnormality. No focal soft tissue abnormality. IMPRESSION: 1. Fluid containing nondilated small and large bowel suggesting acute enteritis/diarrhea. 2. Foley catheter with no adverse features. Bladder wall thickening and mild inflammation, but decreased from 2024-04-21. 3. Cirrhosis with mild splenomegaly. Chronic renal atrophy and nephrolithiasis. 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 Lung base atelectasis. Electronically signed by: Helayne Hurst MD 07/31/2024 09:56 AM EST RP Workstation: [id number]   CT Head Wo Contrast Result Date: 07/31/2024 EXAM: CT HEAD WITHOUT CONTRAST 07/31/2024 09:28:22 AM TECHNIQUE: CT of the head was performed without the administration of intravenous contrast. Automated exposure control, iterative reconstruction, and/or weight based adjustment of the mA/kV was utilized to reduce the radiation dose to as low as reasonably achievable. COMPARISON: Head CT 07/18/2024, Brain MRI 09/22/2022. CLINICAL HISTORY: 73 year old male found unresponsive. FINDINGS: BRAIN AND VENTRICLES: No acute hemorrhage. No evidence of acute infarct. No hydrocephalus. No extra-axial collection. No mass effect or midline shift. Brain volume remains normal for age. Gray white  differentiation stable and normal for age. No suspicious intracranial vascular hyperdensity. Mild calcified atherosclerosis at the skull base. ORBITS: No acute abnormality. Stable small chronic metallic cerclage wire along the right superior orbital rim. SINUSES: Paranasal sinuses remain well aerated. SOFT TISSUES AND SKULL: No acute soft tissue abnormality. No skull fracture. Tympanic cavities and mastoid air cells remain well aerated. IMPRESSION: 1. No acute traumatic injury identified. 2. Stable and normal for age non-contrast CT appearance of the brain. Electronically signed by: Helayne Hurst MD 07/31/2024 09:36 AM EST RP Workstation: [id number]   DG Chest Port 1 View Result Date: 07/31/2024 CLINICAL  DATA:  Questionable sepsis - evaluate for abnormality EXAM: PORTABLE CHEST - 1 VIEW COMPARISON:  06/26/2024 FINDINGS: No focal airspace consolidation, pleural effusion, or pneumothorax. Mild cardiomegaly. Tortuous aorta with aortic atherosclerosis. No acute fracture or destructive lesions. Multilevel thoracic osteophytosis. Cervical fusion hardware. Right upper quadrant surgical clips. IMPRESSION: Mild cardiomegaly. No pneumonia or pulmonary edema. Electronically Signed   By: Rogelia Myers M.D.   On: 07/31/2024 09:05   CT Lumbar Spine Wo Contrast Result Date: 07/18/2024 EXAM: CT OF THE LUMBAR SPINE WITHOUT CONTRAST 07/18/2024 08:29:22 AM TECHNIQUE: CT of the lumbar spine was performed without the administration of intravenous contrast. Multiplanar reformatted images are provided for review. Automated exposure control, iterative reconstruction, and/or weight based adjustment of the mA/kV was utilized to reduce the radiation dose to as low as reasonably achievable. COMPARISON: Lumbar CT 08/12/2022. Lumbar MRI 06/20/2023. CLINICAL HISTORY: 73 year old male with a history of fall. FINDINGS: BONES AND ALIGNMENT: Normal lumbar segmentation as designated on the previous MRI. Normal vertebral body heights. Stable mild grade 1 degenerative anterolisthesis of L4 on L5. No acute traumatic injury identified in the lumbar spine. No suspicious bone lesion. DEGENERATIVE CHANGES: Chronic lumbar spine degenerative disc bulging and facet hypertrophy appears stable since the MRI last year. Stable T12 L1 midline calcified disc protrusion or calcification of the posterior longitudinal ligament. No significant lumbar spinal stenosis by CT. SOFT TISSUES: Chronic nephrolithiasis is partially visible. Minimal calcified aortoiliac atherosclerosis. No acute abnormality. IMPRESSION: 1. No acute traumatic injury identified in the lumbar spine. 2. Stable chronic lumbar spine degeneration since MRI last year. 3. Chronic nephrolithiasis,  minimal calcified aortoiliac atherosclerosis. Electronically signed by: Helayne Hurst MD 07/18/2024 08:39 AM EST RP Workstation: [id number]   CT Head Wo Contrast Result Date: 07/18/2024 EXAM: CT HEAD WITHOUT CONTRAST 07/18/2024 08:29:22 AM TECHNIQUE: CT of the head was performed without the administration of intravenous contrast. Automated exposure control, iterative reconstruction, and/or weight based adjustment of the mA/kV was utilized to reduce the radiation dose to as low as reasonably achievable. COMPARISON: Brain MRI 09/22/2022. Head CT 05/15/2024. CLINICAL HISTORY: 73 year old male with history of fall and trauma. FINDINGS: BRAIN AND VENTRICLES: No acute hemorrhage. No evidence of acute infarct. No hydrocephalus. No extra-axial collection. No mass effect or midline shift. Brain volume is stable and within normal limits for age. Mild for age calcified atherosclerosis at the skull base. Gray white differentiation is stable and normal for age. No suspicious intracranial vascular hyperdensity. ORBITS: No acute abnormality. No acute orbital injury identified. SINUSES: Paranasal sinuses remain well aerated. SOFT TISSUES AND SKULL: No acute soft tissue abnormality. No acute scalp soft tissue injury identified. No skull fracture. Middle ears and mastoids remain well aerated. IMPRESSION: 1. No acute traumatic injury identified. 2. Stable and normal for age non-contrast head CT. Electronically signed by: Helayne Hurst MD 07/18/2024 08:35 AM EST RP Workstation: [id number]    Microbiology: Results for orders  placed or performed during the hospital encounter of 07/31/24  Blood Culture (routine x 2)     Status: None   Collection Time: 07/31/24  8:23 AM   Specimen: BLOOD LEFT ARM  Result Value Ref Range Status   Specimen Description BLOOD LEFT ARM  Final   Special Requests   Final    BOTTLES DRAWN AEROBIC AND ANAEROBIC Blood Culture results may not be optimal due to an inadequate volume of blood received in  culture bottles   Culture   Final    NO GROWTH 5 DAYS Performed at [facility], [address] [place], [place] [address]    Report Status 08/05/2024 FINAL  Final  Urine Culture     Status: Abnormal   Collection Time: 07/31/24  8:24 AM   Specimen: Urine, Random  Result Value Ref Range Status   Specimen Description   Final    URINE, RANDOM Performed at [facility], [address], [place], [place] [address]    Special Requests   Final    NONE Reflexed from [phone] Performed at [facility], [address] [place], [place] [address]    Culture MULTIPLE SPECIES PRESENT, SUGGEST RECOLLECTION (A)  Final   Report Status 08/01/2024 FINAL  Final  Blood Culture (routine x 2)     Status: None   Collection Time: 07/31/24  9:52 AM   Specimen: BLOOD  Result Value Ref Range Status   Specimen Description BLOOD BLOOD LEFT HAND  Final   Special Requests   Final    BOTTLES DRAWN AEROBIC AND ANAEROBIC Blood Culture adequate volume   Culture   Final    NO GROWTH 5 DAYS Performed at [facility], [address], [place], [place] [address]    Report Status 08/05/2024 FINAL  Final  MRSA Next Gen by PCR, Nasal     Status: None   Collection Time: 07/31/24 12:25 PM   Specimen: Nasal Mucosa; Nasal Swab  Result Value Ref Range Status   MRSA by PCR Next Gen NOT DETECTED NOT DETECTED Final    Comment: (NOTE) The GeneXpert MRSA Assay (FDA approved for NASAL specimens only), is one component of a comprehensive MRSA colonization surveillance program. It is not intended to diagnose MRSA infection nor to guide or monitor treatment for MRSA infections. Test performance is not FDA approved in patients less than 75 years old. Performed at [facility], [address] [place], [place] [address]     Labs: CBC: Recent Labs  Lab 07/31/24 [phone] 08/01/24 0328 08/02/24 0512 08/03/24 0658  WBC 3.9* 6.8 5.0 3.5*  NEUTROABS 2.9  --   --   --   HGB 10.1*  10.4* 8.9* 9.1*  HCT 31.6* 31.9* 27.8* 28.2*  MCV 92.9 94.1 95.2 95.3  PLT 118* 93* 84* 76*   Basic Metabolic Panel: Recent Labs  Lab 07/31/24 0952 08/01/24 0204 08/02/24 0512 08/03/24 0658 08/04/24 0552  NA 138 140 143 142 137  K 4.5 4.3 3.8 4.3 4.0  CL 107 111 112* 113* 106  CO2 21* 20* 22 24 21*  GLUCOSE 131* 79 73 91 121*  BUN 19 19 18 22  27*  CREATININE 1.40* 1.30* 1.29* 1.22 1.23  CALCIUM  9.8 9.5 9.4 10.0 10.3  MG  --  2.0 1.7 2.1  --   PHOS  --  2.4* 3.1 2.3*  --    Liver Function Tests: Recent Labs  Lab 07/31/24 9047 08/01/24 9795 08/02/24 9487 08/03/24 9341 08/04/24 9447  AST 34 38 32 32 46*  ALT 18 16 17 17 22   ALKPHOS 79 75 69 77 86  BILITOT 1.3* 1.4* 1.6* 1.1 1.4*  PROT 7.4 7.3 6.3* 6.8 7.3  ALBUMIN  3.0* 2.8* 2.6* 2.7* 2.9*   CBG: Recent Labs  Lab 08/05/24 2043 08/06/24 0024 08/06/24 0501 08/06/24 0533 08/06/24 0737  GLUCAP 135* 96 68* 72 117*    Discharge time spent: greater than 30 minutes.  Signed: Laree Lock, MD [facility] 08/06/2024

## 2024-08-06 NOTE — TOC Transition Note (Signed)
 Transition of Care [facility]) - Discharge Note   Patient Details  Name: [name] MRN: [id number] Date of Birth: August 29, 1950  Transition of Care [facility] CM/SW Contact:  Nathanael CHRISTELLA Ring, RN Phone Number: 08/06/2024, 11:14 AM   Clinical Narrative:     Patient did really well with the mobility specialist this morning, he walked out in the hall around the nursing station with his walker steadily.  Called [place] and asked if he could come and pick the patient up as he does not need SNF at this point.  CM messaged the provider and the nurse that [name] said he could come and pick up the patient within the next hour.  He will call the unit when he arrives.    Final next level of care: Assisted Living Barriers to Discharge: Barriers Resolved   Patient Goals and CMS Choice   CMS Medicare.gov Compare Post Acute Care list provided to:: Patient        Discharge Placement PASRR number recieved: 08/05/24            Patient chooses bed at: Other - please specify in the comment section below: ([facility]) Patient to be transferred to facility by: [name] Later Name of family member notified: [name] Patient and family notified of of transfer: 08/06/24  Discharge Plan and Services Additional resources added to the After Visit Summary for     Discharge Planning Services: CM Consult Post Acute Care Choice: Skilled Nursing Facility                    HH Arranged: PT, OT          Social Drivers of Health (SDOH) Interventions SDOH Screenings   Food Insecurity: Patient Unable To Answer (08/01/2024)  Housing: Low Risk (06/29/2024)  Transportation Needs: No Transportation Needs (06/28/2024)  Utilities: Not At Risk (06/28/2024)  Alcohol Screen: Low Risk (09/24/2022)  Depression (PHQ2-9): High Risk (04/26/2022)  Financial Resource Strain: High Risk (04/27/2022)  Social Connections: Socially Isolated (06/28/2024)  Tobacco Use: High Risk (07/31/2024)     Readmission Risk Interventions     11/29/2023    5:03 PM 11/13/2023    2:21 PM 11/12/2023    4:00 PM  Readmission Risk Prevention Plan  Transportation Screening Not Complete  Not Complete  Medication Review Oceanographer) Not Complete Complete Not Complete  PCP or Specialist appointment within 3-5 days of discharge Not Complete Complete Complete  HRI or Home Care Consult Not Complete Complete   SW Recovery Care/Counseling Consult Not Complete Complete   Palliative Care Screening Not Applicable Complete   Skilled Nursing Facility Not Applicable Not Applicable

## 2024-08-06 NOTE — Progress Notes (Signed)
 Mobility Specialist - Progress Note    08/06/24 1148  Mobility  Activity Ambulated with assistance;Stood at bedside;Dangled on edge of bed  Level of Assistance Contact guard assist, steadying assist  Assistive Device Front wheel walker  Distance Ambulated (ft) 170 ft  Range of Motion/Exercises Active;All extremities  Activity Response Tolerated well  Mobility visit 1 Mobility  Mobility Specialist Start Time (ACUTE ONLY) 1022  Mobility Specialist Stop Time (ACUTE ONLY) 1050  Mobility Specialist Time Calculation (min) (ACUTE ONLY) 28 min   Pt was supine in bed with the HOB elevated on RA upon entry. Pt agreed to mobility. Pt is able today to get to the EOB independently with bed features. Pt is able today to STS independently with 2 WW. Pt ambulated well. Pt did not need a recovery break throughout activity. After activity pt returned to the room back in bed with the bed alarm on and needs within reach.  Clem Rodes Mobility Specialist 08/06/2024, 11:51 AM

## 2024-08-16 ENCOUNTER — Other Ambulatory Visit: Payer: Self-pay

## 2024-08-16 ENCOUNTER — Inpatient Hospital Stay
Admission: EM | Admit: 2024-08-16 | Discharge: 2024-08-18 | DRG: 372 | Disposition: A | Discharge status: Home Health | Attending: Obstetrics and Gynecology | Admitting: Obstetrics and Gynecology

## 2024-08-16 DIAGNOSIS — R8281 Pyuria: Secondary | ICD-10-CM | POA: Diagnosis present

## 2024-08-16 DIAGNOSIS — Z981 Arthrodesis status: Secondary | ICD-10-CM

## 2024-08-16 DIAGNOSIS — Z9049 Acquired absence of other specified parts of digestive tract: Secondary | ICD-10-CM

## 2024-08-16 DIAGNOSIS — J4489 Other specified chronic obstructive pulmonary disease: Secondary | ICD-10-CM | POA: Diagnosis present

## 2024-08-16 DIAGNOSIS — Z8616 Personal history of COVID-19: Secondary | ICD-10-CM

## 2024-08-16 DIAGNOSIS — E875 Hyperkalemia: Secondary | ICD-10-CM | POA: Diagnosis present

## 2024-08-16 DIAGNOSIS — Z6833 Body mass index (BMI) 33.0-33.9, adult: Secondary | ICD-10-CM

## 2024-08-16 DIAGNOSIS — E669 Obesity, unspecified: Secondary | ICD-10-CM | POA: Diagnosis present

## 2024-08-16 DIAGNOSIS — E1122 Type 2 diabetes mellitus with diabetic chronic kidney disease: Secondary | ICD-10-CM | POA: Diagnosis present

## 2024-08-16 DIAGNOSIS — F17210 Nicotine dependence, cigarettes, uncomplicated: Secondary | ICD-10-CM | POA: Diagnosis present

## 2024-08-16 DIAGNOSIS — K7682 Hepatic encephalopathy: Principal | ICD-10-CM | POA: Diagnosis present

## 2024-08-16 DIAGNOSIS — E785 Hyperlipidemia, unspecified: Secondary | ICD-10-CM | POA: Diagnosis present

## 2024-08-16 DIAGNOSIS — K7460 Unspecified cirrhosis of liver: Secondary | ICD-10-CM | POA: Diagnosis present

## 2024-08-16 DIAGNOSIS — Z7984 Long term (current) use of oral hypoglycemic drugs: Secondary | ICD-10-CM

## 2024-08-16 DIAGNOSIS — I10 Essential (primary) hypertension: Secondary | ICD-10-CM | POA: Diagnosis present

## 2024-08-16 DIAGNOSIS — I5032 Chronic diastolic (congestive) heart failure: Secondary | ICD-10-CM | POA: Diagnosis present

## 2024-08-16 DIAGNOSIS — I272 Pulmonary hypertension, unspecified: Secondary | ICD-10-CM | POA: Diagnosis present

## 2024-08-16 DIAGNOSIS — E860 Dehydration: Secondary | ICD-10-CM | POA: Diagnosis present

## 2024-08-16 DIAGNOSIS — Z79899 Other long term (current) drug therapy: Secondary | ICD-10-CM

## 2024-08-16 DIAGNOSIS — E1140 Type 2 diabetes mellitus with diabetic neuropathy, unspecified: Secondary | ICD-10-CM | POA: Diagnosis present

## 2024-08-16 DIAGNOSIS — R296 Repeated falls: Secondary | ICD-10-CM | POA: Diagnosis present

## 2024-08-16 DIAGNOSIS — N1831 Chronic kidney disease, stage 3a: Secondary | ICD-10-CM | POA: Diagnosis present

## 2024-08-16 DIAGNOSIS — E119 Type 2 diabetes mellitus without complications: Secondary | ICD-10-CM

## 2024-08-16 DIAGNOSIS — Z8673 Personal history of transient ischemic attack (TIA), and cerebral infarction without residual deficits: Secondary | ICD-10-CM

## 2024-08-16 DIAGNOSIS — R531 Weakness: Secondary | ICD-10-CM

## 2024-08-16 DIAGNOSIS — Z8619 Personal history of other infectious and parasitic diseases: Secondary | ICD-10-CM

## 2024-08-16 DIAGNOSIS — Z960 Presence of urogenital implants: Secondary | ICD-10-CM | POA: Diagnosis present

## 2024-08-16 DIAGNOSIS — Z87442 Personal history of urinary calculi: Secondary | ICD-10-CM

## 2024-08-16 DIAGNOSIS — Z7982 Long term (current) use of aspirin: Secondary | ICD-10-CM

## 2024-08-16 DIAGNOSIS — Z8249 Family history of ischemic heart disease and other diseases of the circulatory system: Secondary | ICD-10-CM

## 2024-08-16 DIAGNOSIS — A0472 Enterocolitis due to Clostridium difficile, not specified as recurrent: Principal | ICD-10-CM | POA: Diagnosis present

## 2024-08-16 DIAGNOSIS — F329 Major depressive disorder, single episode, unspecified: Secondary | ICD-10-CM | POA: Diagnosis present

## 2024-08-16 DIAGNOSIS — K922 Gastrointestinal hemorrhage, unspecified: Secondary | ICD-10-CM | POA: Diagnosis present

## 2024-08-16 DIAGNOSIS — I2510 Atherosclerotic heart disease of native coronary artery without angina pectoris: Secondary | ICD-10-CM | POA: Diagnosis present

## 2024-08-16 DIAGNOSIS — G4730 Sleep apnea, unspecified: Secondary | ICD-10-CM | POA: Diagnosis present

## 2024-08-16 DIAGNOSIS — Z8505 Personal history of malignant neoplasm of liver: Secondary | ICD-10-CM

## 2024-08-16 DIAGNOSIS — I130 Hypertensive heart and chronic kidney disease with heart failure and stage 1 through stage 4 chronic kidney disease, or unspecified chronic kidney disease: Secondary | ICD-10-CM | POA: Diagnosis present

## 2024-08-16 LAB — CBC
HCT: 27.1 % — ABNORMAL LOW (ref 39.0–52.0)
HCT: 27.7 % — ABNORMAL LOW (ref 39.0–52.0)
Hemoglobin: 8.6 g/dL — ABNORMAL LOW (ref 13.0–17.0)
Hemoglobin: 8.8 g/dL — ABNORMAL LOW (ref 13.0–17.0)
MCH: 30.2 pg (ref 26.0–34.0)
MCH: 30.3 pg (ref 26.0–34.0)
MCHC: 31.7 g/dL (ref 30.0–36.0)
MCHC: 31.8 g/dL (ref 30.0–36.0)
MCV: 95.1 fL (ref 80.0–100.0)
MCV: 95.5 fL (ref 80.0–100.0)
Platelets: 103 K/uL — ABNORMAL LOW (ref 150–400)
Platelets: 110 K/uL — ABNORMAL LOW (ref 150–400)
RBC: 2.85 MIL/uL — ABNORMAL LOW (ref 4.22–5.81)
RBC: 2.9 MIL/uL — ABNORMAL LOW (ref 4.22–5.81)
RDW: 17 % — ABNORMAL HIGH (ref 11.5–15.5)
RDW: 17.1 % — ABNORMAL HIGH (ref 11.5–15.5)
WBC: 3.4 K/uL — ABNORMAL LOW (ref 4.0–10.5)
WBC: 2.8 K/uL — ABNORMAL LOW (ref 4.0–10.5)
nRBC: 0 % (ref 0.0–0.2)
nRBC: 0 % (ref 0.0–0.2)

## 2024-08-16 LAB — GASTROINTESTINAL PANEL BY PCR, STOOL (REPLACES STOOL CULTURE)

## 2024-08-16 LAB — RESP PANEL BY RT-PCR (RSV, FLU A&B, COVID)  RVPGX2
Influenza A by PCR: NEGATIVE
Influenza B by PCR: NEGATIVE
Resp Syncytial Virus by PCR: NEGATIVE
SARS Coronavirus 2 by RT PCR: NEGATIVE

## 2024-08-16 LAB — COMPREHENSIVE METABOLIC PANEL WITH GFR
ALT: 20 U/L (ref 0–44)
AST: 48 U/L — ABNORMAL HIGH (ref 15–41)
Albumin: 2.8 g/dL — ABNORMAL LOW (ref 3.5–5.0)
Alkaline Phosphatase: 99 U/L (ref 38–126)
Anion gap: 7 (ref 5–15)
BUN: 23 mg/dL (ref 8–23)
CO2: 20 mmol/L — ABNORMAL LOW (ref 22–32)
Calcium: 8.8 mg/dL — ABNORMAL LOW (ref 8.9–10.3)
Chloride: 113 mmol/L — ABNORMAL HIGH (ref 98–111)
Creatinine, Ser: 1.63 mg/dL — ABNORMAL HIGH (ref 0.61–1.24)
GFR, Estimated: 44 mL/min — ABNORMAL LOW
Glucose, Bld: 87 mg/dL (ref 70–99)
Potassium: 5.4 mmol/L — ABNORMAL HIGH (ref 3.5–5.1)
Sodium: 140 mmol/L (ref 135–145)
Total Bilirubin: 0.7 mg/dL (ref 0.0–1.2)
Total Protein: 6.9 g/dL (ref 6.5–8.1)

## 2024-08-16 LAB — URINALYSIS, ROUTINE W REFLEX MICROSCOPIC
Bilirubin Urine: NEGATIVE
Glucose, UA: NEGATIVE mg/dL
Ketones, ur: NEGATIVE mg/dL
Nitrite: NEGATIVE
Protein, ur: NEGATIVE mg/dL
RBC / HPF: >50 RBC/hpf (ref 0–5)
Specific Gravity, Urine: 1.01 (ref 1.005–1.030)
Squamous Epithelial / HPF: 0 /HPF (ref 0–5)
WBC, UA: >50 WBC/hpf (ref 0–5)
pH: 5 (ref 5.0–8.0)

## 2024-08-16 LAB — GLUCOSE, CAPILLARY
Glucose-Capillary: 99 mg/dL (ref 70–99)
Glucose-Capillary: 110 mg/dL — ABNORMAL HIGH (ref 70–99)

## 2024-08-16 LAB — AMMONIA: Ammonia: 243 umol/L — ABNORMAL HIGH (ref 9–35)

## 2024-08-16 LAB — C DIFFICILE QUICK SCREEN W PCR REFLEX
C Diff antigen: POSITIVE — AB
C Diff interpretation: DETECTED
C Diff toxin: POSITIVE — AB

## 2024-08-16 LAB — BASIC METABOLIC PANEL WITH GFR
Anion gap: 7 (ref 5–15)
BUN: 22 mg/dL (ref 8–23)
CO2: 20 mmol/L — ABNORMAL LOW (ref 22–32)
Calcium: 8.7 mg/dL — ABNORMAL LOW (ref 8.9–10.3)
Chloride: 117 mmol/L — ABNORMAL HIGH (ref 98–111)
Creatinine, Ser: 1.51 mg/dL — ABNORMAL HIGH (ref 0.61–1.24)
GFR, Estimated: 48 mL/min — ABNORMAL LOW
Glucose, Bld: 74 mg/dL (ref 70–99)
Potassium: 4.7 mmol/L (ref 3.5–5.1)
Sodium: 143 mmol/L (ref 135–145)

## 2024-08-16 LAB — HEMOGLOBIN A1C
Hgb A1c MFr Bld: 4.8 % (ref 4.8–5.6)
Mean Plasma Glucose: 91.06 mg/dL

## 2024-08-16 MED ORDER — LACTULOSE 10 GM/15ML PO SOLN
20.0000 g | Freq: Once | ORAL | Status: DC
  Filled 2024-08-16: qty 30

## 2024-08-16 MED ORDER — ACETAMINOPHEN 325 MG PO TABS
650.0000 mg | ORAL_TABLET | Freq: Four times a day (QID) | ORAL | Status: DC | PRN
  Administered 2024-08-16 – 2024-08-17 (×3): 650 mg via ORAL
  Filled 2024-08-16 (×3): qty 2

## 2024-08-16 MED ORDER — INSULIN ASPART 100 UNIT/ML IJ SOLN: 0.0000 [IU] | Freq: Every day | INTRAMUSCULAR | Status: DC

## 2024-08-16 MED ORDER — OXYCODONE HCL 5 MG PO TABS
5.0000 mg | ORAL_TABLET | ORAL | Status: DC | PRN
  Administered 2024-08-16: 5 mg via ORAL
  Filled 2024-08-16: qty 1

## 2024-08-16 MED ORDER — QUETIAPINE FUMARATE 25 MG PO TABS
50.0000 mg | ORAL_TABLET | Freq: Once | ORAL | Status: AC
  Administered 2024-08-16: 50 mg via ORAL
  Filled 2024-08-16: qty 2

## 2024-08-16 MED ORDER — VANCOMYCIN HCL 125 MG PO CAPS
125.0000 mg | ORAL_CAPSULE | Freq: Four times a day (QID) | ORAL | Status: DC
  Administered 2024-08-16 – 2024-08-18 (×7): 125 mg via ORAL
  Filled 2024-08-16 (×11): qty 1

## 2024-08-16 MED ORDER — HEPARIN SODIUM (PORCINE) 5000 UNIT/ML IJ SOLN
5000.0000 [IU] | Freq: Three times a day (TID) | INTRAMUSCULAR | Status: DC
  Administered 2024-08-16 – 2024-08-18 (×5): 5000 [IU] via SUBCUTANEOUS
  Filled 2024-08-16 (×5): qty 1

## 2024-08-16 MED ORDER — ACETAMINOPHEN 650 MG RE SUPP: 650.0000 mg | Freq: Four times a day (QID) | RECTAL | Status: DC | PRN

## 2024-08-16 MED ORDER — INSULIN ASPART 100 UNIT/ML IJ SOLN: 0.0000 [IU] | Freq: Three times a day (TID) | INTRAMUSCULAR | Status: DC

## 2024-08-16 MED ORDER — SODIUM CHLORIDE 0.9 % IV BOLUS
1000.0000 mL | Freq: Once | INTRAVENOUS | Status: AC
  Administered 2024-08-16: 1000 mL via INTRAVENOUS

## 2024-08-16 MED ORDER — SODIUM CHLORIDE 0.9 % IV SOLN
1.0000 g | Freq: Once | INTRAVENOUS | Status: AC
  Administered 2024-08-16: 1 g via INTRAVENOUS
  Filled 2024-08-16: qty 10

## 2024-08-16 MED ORDER — CHLORHEXIDINE GLUCONATE CLOTH 2 % EX PADS
6.0000 | MEDICATED_PAD | Freq: Every day | CUTANEOUS | Status: DC
  Administered 2024-08-16 – 2024-08-18 (×3): 6 via TOPICAL

## 2024-08-16 MED ORDER — ALBUTEROL SULFATE (2.5 MG/3ML) 0.083% IN NEBU: 2.5000 mg | INHALATION_SOLUTION | RESPIRATORY_TRACT | Status: DC | PRN

## 2024-08-16 MED ORDER — ATORVASTATIN CALCIUM 20 MG PO TABS
40.0000 mg | ORAL_TABLET | Freq: Every day | ORAL | Status: DC
  Administered 2024-08-16 – 2024-08-18 (×3): 40 mg via ORAL
  Filled 2024-08-16 (×3): qty 2

## 2024-08-16 MED ORDER — SODIUM CHLORIDE 0.9 % IV SOLN: INTRAVENOUS | Status: DC

## 2024-08-16 MED ORDER — ASPIRIN 81 MG PO CHEW
81.0000 mg | CHEWABLE_TABLET | Freq: Every day | ORAL | Status: DC
  Administered 2024-08-16 – 2024-08-17 (×2): 81 mg via ORAL
  Filled 2024-08-16 (×2): qty 1

## 2024-08-16 MED ORDER — ONDANSETRON HCL 4 MG/2ML IJ SOLN
4.0000 mg | Freq: Once | INTRAMUSCULAR | Status: AC
  Administered 2024-08-16: 4 mg via INTRAVENOUS
  Filled 2024-08-16: qty 2

## 2024-08-16 MED ORDER — MORPHINE SULFATE (PF) 2 MG/ML IV SOLN
2.0000 mg | INTRAVENOUS | Status: DC | PRN
  Administered 2024-08-16: 2 mg via INTRAVENOUS
  Filled 2024-08-16: qty 1

## 2024-08-16 MED ORDER — LACTULOSE 10 GM/15ML PO SOLN: 10.0000 g | Freq: Every day | ORAL | Status: DC | PRN

## 2024-08-16 MED ORDER — SODIUM ZIRCONIUM CYCLOSILICATE 5 G PO PACK
5.0000 g | PACK | Freq: Once | ORAL | Status: AC
  Administered 2024-08-16: 5 g via ORAL
  Filled 2024-08-16: qty 1

## 2024-08-16 MED ORDER — PANTOPRAZOLE SODIUM 40 MG PO TBEC
40.0000 mg | DELAYED_RELEASE_TABLET | Freq: Every day | ORAL | Status: DC
  Administered 2024-08-16 – 2024-08-18 (×3): 40 mg via ORAL
  Filled 2024-08-16 (×3): qty 1

## 2024-08-16 NOTE — ED Triage Notes (Signed)
 Pt comes via EMS from [facility] located at [address] in [place]. Pt here for N/V/D and weakness. Pt states this started at 2am this morning. Pt is A*OX4.   Pt states he just doesn't feel well. Pt has 20g in rac. Pt given fluids and 4 mg zofran .  98.1 107 CBG 95% RA 45 HR SB 135/60

## 2024-08-16 NOTE — ED Provider Notes (Signed)
 "  [facility] Provider Note    Event Date/Time   First MD Initiated Contact with Patient 08/16/24 [phone]     (approximate)   History   Weakness   HPI  SHANNAN GARFINKEL is a 73 y.o. male who presents today with concern of generalized weakness.  Just discharged from our facility recently, I reviewed discharge summary, seems concern of hepatic encephalopathy at the time.  Seems since discharge home has been doing well until last night when he developed nausea vomiting diarrhea.  Since then multiple episodes of diarrhea states about 7-8 episodes of this, no fevers or chills, he has had multiple nausea episodes and continues to have emesis.  He does have a history of hepatic encephalopathy, but seems that this has continued to be improved from prior, primarily complaining of weakness now.  Does complain of some epigastric abdominal discomfort which he believes is secondary to the multiple episodes of vomiting.     Physical Exam   Triage Vital Signs: ED Triage Vitals  Encounter Vitals Group     BP 08/16/24 0741 97/60     Girls Systolic BP Percentile --      Girls Diastolic BP Percentile --      Boys Systolic BP Percentile --      Boys Diastolic BP Percentile --      Pulse Rate 08/16/24 0741 (!) 42     Resp 08/16/24 0741 18     Temp 08/16/24 0741 98.7 F (37.1 C)     Temp src --      SpO2 08/16/24 0741 99 %     Weight 08/16/24 0740 206 lb 9.1 oz (93.7 kg)     Height 08/16/24 0740 5' 6 (1.676 m)     Head Circumference --      Peak Flow --      Pain Score 08/16/24 0740 8     Pain Loc --      Pain Education --      Exclude from Growth Chart --     Most recent vital signs: Vitals:   08/16/24 1430 08/16/24 1445  BP: (!) 112/50   Pulse: (!) 44   Resp:    Temp:  98 F (36.7 C)  SpO2: 100%      General: Awake, no distress.  CV:  Good peripheral perfusion.  Resp:  Normal effort.  Abd:  No distention.  Soft with some mild epigastric discomfort to  palpation Other:     ED Results / Procedures / Treatments   Labs (all labs ordered are listed, but only abnormal results are displayed) Labs Reviewed  C DIFFICILE QUICK SCREEN W PCR REFLEX   - Abnormal; Notable for the following components:      Result Value   C Diff antigen POSITIVE (*)    C Diff toxin POSITIVE (*)    All other components within normal limits  COMPREHENSIVE METABOLIC PANEL WITH GFR - Abnormal; Notable for the following components:   Potassium 5.4 (*)    Chloride 113 (*)    CO2 20 (*)    Creatinine, Ser 1.63 (*)    Calcium  8.8 (*)    Albumin  2.8 (*)    AST 48 (*)    GFR, Estimated 44 (*)    All other components within normal limits  CBC - Abnormal; Notable for the following components:   WBC 3.4 (*)    RBC 2.85 (*)    Hemoglobin 8.6 (*)    HCT 27.1 (*)  RDW 17.0 (*)    Platelets 103 (*)    All other components within normal limits  URINALYSIS, ROUTINE W REFLEX MICROSCOPIC - Abnormal; Notable for the following components:   Color, Urine YELLOW (*)    APPearance CLOUDY (*)    Hgb urine dipstick LARGE (*)    Leukocytes,Ua LARGE (*)    Bacteria, UA MANY (*)    All other components within normal limits  AMMONIA - Abnormal; Notable for the following components:   Ammonia 243 (*)    All other components within normal limits  CBC - Abnormal; Notable for the following components:   WBC 2.8 (*)    RBC 2.90 (*)    Hemoglobin 8.8 (*)    HCT 27.7 (*)    RDW 17.1 (*)    Platelets 110 (*)    All other components within normal limits  BASIC METABOLIC PANEL WITH GFR - Abnormal; Notable for the following components:   Chloride 117 (*)    CO2 20 (*)    Creatinine, Ser 1.51 (*)    Calcium  8.7 (*)    GFR, Estimated 48 (*)    All other components within normal limits  RESP PANEL BY RT-PCR (RSV, FLU A&B, COVID)  RVPGX2  GASTROINTESTINAL PANEL BY PCR, STOOL (REPLACES STOOL CULTURE)  HEMOGLOBIN A1C  CBG MONITORING, ED     EKG  Sinus rhythm with rate of  about 45, axis of about 15, intervals appear to be within normal limits, no obvious ischemia and I appreciate this EKG   RADIOLOGY   PROCEDURES:  Critical Care performed: No  Procedures   MEDICATIONS ORDERED IN ED: Medications  lactulose  (CHRONULAC ) 10 GM/15ML solution 20 g (20 g Oral Patient Refused/Not Given 08/16/24 1330)  heparin  injection 5,000 Units (has no administration in time range)  acetaminophen  (TYLENOL ) tablet 650 mg (650 mg Oral Given 08/16/24 1330)    Or  acetaminophen  (TYLENOL ) suppository 650 mg ( Rectal See Alternative 08/16/24 1330)  oxyCODONE  (Oxy IR/ROXICODONE ) immediate release tablet 5 mg (has no administration in time range)  morphine  (PF) 2 MG/ML injection 2 mg (2 mg Intravenous Given 08/16/24 1522)  0.9 %  sodium chloride  infusion ( Intravenous New Bag/Given 08/16/24 1337)  aspirin  chewable tablet 81 mg (81 mg Oral Given 08/16/24 1522)  atorvastatin  (LIPITOR) tablet 40 mg (40 mg Oral Given 08/16/24 1521)  pantoprazole  (PROTONIX ) EC tablet 40 mg (40 mg Oral Given 08/16/24 1522)  albuterol  (PROVENTIL ) (2.5 MG/3ML) 0.083% nebulizer solution 2.5 mg (has no administration in time range)  vancomycin  (VANCOCIN ) capsule 125 mg (125 mg Oral Given 08/16/24 1330)  insulin  aspart (novoLOG ) injection 0-9 Units (has no administration in time range)  insulin  aspart (novoLOG ) injection 0-5 Units (has no administration in time range)  lactulose  (CHRONULAC ) 10 GM/15ML solution 10 g (has no administration in time range)  sodium chloride  0.9 % bolus 1,000 mL (0 mLs Intravenous Stopped 08/16/24 1321)  ondansetron  (ZOFRAN ) injection 4 mg (4 mg Intravenous Given 08/16/24 0913)  cefTRIAXone  (ROCEPHIN ) 1 g in sodium chloride  0.9 % 100 mL IVPB (0 g Intravenous Stopped 08/16/24 0946)  sodium zirconium cyclosilicate  (LOKELMA ) packet 5 g (5 g Oral Given 08/16/24 1331)     IMPRESSION / MDM / ASSESSMENT AND PLAN / ED COURSE  I reviewed the triage vital signs and the nursing  notes.                               Patient's  presentation is most consistent with acute complicated illness / injury requiring diagnostic workup.  73 year old male who presents with concern of nausea vomiting diarrhea and weakness.  Multiple episodes of diarrhea overnight.  He appears well he is not in any acute distress while in the exam room, seems that he has another bowel movement while in the stretcher.  He was just recently admitted for concern of hepatic encephalopathy, creatinine is slightly above baseline, possibly secondary to prerenal cause, we will provide fluids here and antiemetics, will continue to monitor closely given his multiple episodes of diarrhea will add on C. difficile as well as stool culture and give a dose of Rocephin  for what appears to be a UTI.  Will reassess after fluid resuscitation and remaining lab results and determine appropriate and safe disposition accordingly.   Clinical Course as of 08/16/24 1532  Sun Aug 16, 2024  1029 Ammonia significantly elevated, he does have UTI which may be the underlying triggering factor here.  He is getting Rocephin  now, will reach out to hospitalist service for further assessment and evaluation [SK]  1057 Spoke with Dr. Paudel, plan for admission to medicine service. [SK]    Clinical Course User Index [SK] [name], MD     FINAL CLINICAL IMPRESSION(S) / ED DIAGNOSES   Final diagnoses:  Hepatic encephalopathy (HCC)     Rx / DC Orders   ED Discharge Orders     None        Note:  This document was prepared using Dragon voice recognition software and may include unintentional dictation errors.   [name], MD 08/16/24 [phone]  "

## 2024-08-16 NOTE — H&P (Signed)
 "  History and Physical    WETZEL MEESTER FMW:[id number] DOB: 06-Jan-1951 DOA: 08/16/2024  DOS: the patient was seen and examined on 08/16/2024  PCP: [facility]   Patient coming from: SNF  I have personally briefly reviewed patient's old medical records in [facility] Link  Chief Complaint: Egbert Diarrhea   HPI: CARDER YIN is a pleasant 73 y.o. male with medical history significant for recurrent hepatic encephalopathy, COPD, CHF, HTN, DM who was recently admitted and discharged after being treated for hepatic encephalopathy between 07/31/2024 and 08/06/2024, came in to the hospital complaining of generalized weakness and diarrhea for the last 2 days.  Patient stated that he had 7 or 8 episodes of diarrhea yesterday without any fever or chills.  He also had some episodes of nausea and -2 episodes  of vomiting.  Patient has a history of hepatic encephalopathy but he was able to provide meaningful history.  He complains some abdominal discomfort but he says this is not bad.  He denies any cough, shortness of breath, palpitations.  He denies any blood in the stool or hematemesis.  He stated that due to multiple episodes of diarrhea he is feeling weak to the point that he is not able to ambulate.  ED Course: Upon arrival to the ED, patient is found to be bradycardic at 42, no leukocytosis, potassium 5.4, creatinine 1.63, positive stool for C. difficile.  Patient was also positive for urine analysis for possible UTI.  Hospitalist service was consulted for evaluation for admission.  Review of Systems:  ROS  All other systems negative except as noted in the HPI.  Past Medical History:  Diagnosis Date   (HFpEF) heart failure with preserved ejection fraction (HCC)    a.) TTE 03/20/2018: EF 60-65%, no RWMAs, mild LAE, mild-mod MR, PASP 42; b.) TTE 03/31/2021: EF 55-60%, no RWMAs, mild LVH, mild LAE, AoV sclerosis without stenosis; c.) TTE 03/25/2022: EF 60-65%, no RWMAs, AoV  sclerosis without stenosis; d.) TTE 12/24/2022: EF 60-65%, no RWMAs, mild LVH, mild LAE.   Anasarca    Anemia    Anxiety    Aortic atherosclerosis    Arthritis    Asthma    Bacteremia due to Escherichia coli 05/2023   Bacteremia due to Klebsiella pneumoniae 2023   Bilateral carotid artery disease    Bradycardia    CAD (coronary artery disease) 10/21/2006   a.) MV 10/21/2006: small reversible defect involving the apical  lateral wall c/w possible ischemia; b.) MV: 03/24/2018: no ischemia; c.) MV 10/04/2021: LAD calcifications   CAD (coronary artery disease)    Cervical radiculopathy    Chest pain    a.) MV 03/21/2018: no ischemia; b.) MV 10/04/2021: no ischemia   Chronic back pain    Chronic common bile duct dilatation    Cirrhosis (HCC)    a.) (+) hepatic lesion on CT; likely hepatocellular carcinoma; b.) CT evidence of associated portal hypertension; c.) (+) abdominopelvic anasarca   COPD (chronic obstructive pulmonary disease) (HCC)    DDD (degenerative disc disease), lumbar    Depression    Frequent falls    GERD (gastroesophageal reflux disease)    HBV (hepatitis B virus) infection    HCV (hepatitis C virus)    Hernia of anterior abdominal wall    History of 2019 novel coronavirus disease (COVID-19) 04/16/2020   Homelessness    a.) limited on placement as patient is listed on [place] sex offender listing for exploitation of a minor (possession of  child pornography); incarcerated 12 years (released 11/2017)   Hyperlipidemia    Hypertension    Infestation by bed bug 02/2022   Malingering    Migraine    Nephrolithiasis    Obesity    Portal hypertension (HCC)    PTSD (post-traumatic stress disorder)    a.) brief training in the [employer] at age 89; someone was killed in front of him   Pulmonary HTN (HCC)    a.) multiple CT scans desmontrating dilated PA c/w pHTN; b.) TTE 03/20/2018: PASP 42 mmHg   Sleep apnea    a.) no nocturnal PAP therapy   Sleep apnea    Stenosis of cervical  spine with myelopathy (HCC)    Suicidal ideations    T2DM (type 2 diabetes mellitus) (HCC)    Thrombocytopenia    a.) related to hepatic cirrhosis; spleen normal in size   TIA (transient ischemic attack)    TIA (transient ischemic attack) 09/30/2023    Past Surgical History:  Procedure Laterality Date   ANTERIOR CERVICAL DECOMP/DISCECTOMY FUSION N/A 07/01/2023   Procedure: ANTERIOR CERVICAL DISCECTOMY AND FUSION (FORGE);  Surgeon: Clois Fret, MD;  Location: [facility] ORS;  Service: Neurosurgery;  Laterality: N/A;  Keep as last case of the day per Dr Clois   ANTERIOR CERVICAL DISCECTOMY     APPENDECTOMY     CARDIAC CATHETERIZATION     CHOLECYSTECTOMY     COLONOSCOPY N/A 04/01/2024   Procedure: COLONOSCOPY;  Surgeon: Toledo, Ladell POUR, MD;  Location: [facility] ENDOSCOPY;  Service: Gastroenterology;  Laterality: N/A;   ESOPHAGOGASTRODUODENOSCOPY N/A 04/01/2024   Procedure: EGD (ESOPHAGOGASTRODUODENOSCOPY);  Surgeon: Toledo, Ladell POUR, MD;  Location: [facility] ENDOSCOPY;  Service: Gastroenterology;  Laterality: N/A;   EYE SURGERY     INNER EAR SURGERY     IR CYSTOSTOMY TUBE PLACEMENT/BLADDER ASPIRATION  04/13/2024   NOSE SURGERY     POLYPECTOMY  04/01/2024   Procedure: POLYPECTOMY, INTESTINE;  Surgeon: Toledo, Ladell POUR, MD;  Location: [facility] ENDOSCOPY;  Service: Gastroenterology;;   XI ROBOTIC LAPAROSCOPIC ASSISTED APPENDECTOMY N/A 11/05/2022   Procedure: XI ROBOTIC LAPAROSCOPIC ASSISTED APPENDECTOMY;  Surgeon: Rodolph Romano, MD;  Location: [facility] ORS;  Service: General;  Laterality: N/A;     reports that he has been smoking cigarettes. He has never used smokeless tobacco. He reports that he does not currently use alcohol. He reports that he does not currently use drugs.  Allergies[1]  Family History  Problem Relation Age of Onset   Hypertension Mother    Heart disease Mother    Heart failure Maternal Grandmother     Prior to Admission medications  Medication Sig Start Date End Date  Taking? Authorizing Provider  acetaminophen  (TYLENOL ) 650 MG CR tablet Take 650 mg by mouth every 8 (eight) hours as needed for pain.    [provider]  albuterol  (PROVENTIL ) (2.5 MG/3ML) 0.083% nebulizer solution Take 2.5 mg by nebulization every 4 (four) hours as needed for wheezing or shortness of breath. 07/16/23   [provider]  aspirin  81 MG chewable tablet Chew 81 mg by mouth daily.    [provider]  atorvastatin  (LIPITOR) 40 MG tablet Take 40 mg by mouth daily.    [provider]  benzocaine  (ORAJEL) 10 % mucosal gel Apply to the tip of the penis for penile pain up to four times daily prn for penile pain Patient not taking: Reported on 07/31/2024 06/11/24   Helon Kirsch A, PA-C  citalopram  (CELEXA ) 20 MG tablet Take 20 mg by mouth daily.  [provider]  feeding supplement (ENSURE ENLIVE / ENSURE PLUS) LIQD Take 237 mLs by mouth 3 (three) times daily between meals. 10/25/23   Barbarann Nest, MD  furosemide  (LASIX ) 40 MG tablet Take 40 mg by mouth daily.    [provider]  gabapentin  (NEURONTIN ) 300 MG capsule Take 300 mg by mouth 2 (two) times daily. 06/07/23   [provider]  lactulose  (CHRONULAC ) 10 GM/15ML solution Take 67.5 mLs (45 g total) by mouth 3 (three) times daily. Please titrate to have 2-3 soft bowel movements daily 05/16/24   Gordan Huxley, MD  methocarbamol  (ROBAXIN ) 500 MG tablet Take 1 tablet (500 mg total) by mouth every 8 (eight) hours as needed for muscle spasms. 08/05/24   Jerelene Critchley, MD  Multiple Vitamin (MULTIVITAMIN WITH MINERALS) TABS tablet Take 1 tablet by mouth daily.    [provider]  nitroGLYCERIN  (NITROLINGUAL ) 0.4 MG/SPRAY spray Place 1 spray under the tongue every 5 (five) minutes x 3 doses as needed for chest pain. 10/30/23   Donette Ellouise LABOR, FNP  ondansetron  (ZOFRAN ) 4 MG tablet Take 1 tablet (4 mg total) by mouth every 6 (six) hours as needed for nausea. 06/21/23    Josette Ade, MD  pantoprazole  (PROTONIX ) 40 MG tablet Take 40 mg by mouth daily. 09/24/23   [provider]  phenol (CHLORASEPTIC) 1.4 % LIQD Use as directed 1 spray in the mouth or throat as needed for throat irritation / pain.    [provider]  QUEtiapine  (SEROQUEL ) 50 MG tablet Take 50 mg by mouth at bedtime. 11/19/23   [provider]  senna (SENOKOT) 8.6 MG TABS tablet Take 1 tablet (8.6 mg total) by mouth 2 (two) times daily as needed for mild constipation. 07/02/23   Gregory Edsel Ruth, PA  spironolactone  (ALDACTONE ) 25 MG tablet Take 0.5 tablets (12.5 mg total) by mouth daily. 07/03/24 08/02/24  Al-Sultani, Anmar, MD  VENTOLIN  HFA 108 (90 Base) MCG/ACT inhaler Inhale 2 puffs into the lungs every 4 (four) hours as needed for wheezing. 07/16/23   [provider]  XIFAXAN  550 MG TABS tablet Take 550 mg by mouth 2 (two) times daily. Patient not taking: Reported on 07/31/2024 09/24/23   [provider]    Physical Exam: Vitals:   08/16/24 0741 08/16/24 0900 08/16/24 0930 08/16/24 1000  BP: 97/60 (!) 127/55 115/61 (!) 117/53  Pulse: (!) 42 (!) 45 (!) 44 (!) 47  Resp: 18 19 18 17   Temp: 98.7 F (37.1 C)     SpO2: 99% 100% 100% 100%  Weight:      Height:        Physical Exam   Constitutional: Alert, awake, calm, comfortable HEENT: Neck supple Respiratory: Clear to auscultation B/L, no wheezing, no rales.  Cardiovascular: Regular rate and rhythm, no murmurs / rubs / gallops. No extremity edema. 2+ pedal pulses. No carotid bruits.  Abdomen: Soft, no tenderness, Bowel sounds positive.  Chronic indwelling urinary catheter present with yellowish urine Musculoskeletal: no clubbing / cyanosis. Good ROM, no contractures. Normal muscle tone.  Skin: no rashes, lesions, ulcers. Neurologic: CN 2-12 grossly intact. Sensation intact, No focal deficit identified Psychiatric: Alert and oriented x 3. Normal mood.    Labs on Admission: I have personally  reviewed following labs and imaging studies  CBC: Recent Labs  Lab 08/16/24 0742  WBC 3.4*  HGB 8.6*  HCT 27.1*  MCV 95.1  PLT 103*   Basic Metabolic Panel: Recent Labs  Lab 08/16/24 [phone]  NA 140  K 5.4*  CL 113*  CO2 20*  GLUCOSE 87  BUN 23  CREATININE 1.63*  CALCIUM  8.8*   GFR: Estimated Creatinine Clearance: 42 mL/min (A) (by C-G formula based on SCr of 1.63 mg/dL (H)). Liver Function Tests: Recent Labs  Lab 08/16/24 0742  AST 48*  ALT 20  ALKPHOS 99  BILITOT 0.7  PROT 6.9  ALBUMIN  2.8*   No results for input(s): LIPASE, AMYLASE in the last 168 hours. Recent Labs  Lab 08/16/24 0920  AMMONIA 243*   Coagulation Profile: No results for input(s): INR, PROTIME in the last 168 hours. Cardiac Enzymes: No results for input(s): CKTOTAL, CKMB, CKMBINDEX, TROPONINI, TROPONINIHS in the last 168 hours. BNP (last 3 results) Recent Labs    08/22/23 1150 10/21/23 1842 12/30/23 1631  BNP 252.4* 250.6* 381.2*   HbA1C: No results for input(s): HGBA1C in the last 72 hours. CBG: No results for input(s): GLUCAP in the last 168 hours. Lipid Profile: No results for input(s): CHOL, HDL, LDLCALC, TRIG, CHOLHDL, LDLDIRECT in the last 72 hours. Thyroid  Function Tests: No results for input(s): TSH, T4TOTAL, FREET4, T3FREE, THYROIDAB in the last 72 hours. Anemia Panel: No results for input(s): VITAMINB12, FOLATE, FERRITIN, TIBC, IRON, RETICCTPCT in the last 72 hours. Urine analysis:    Component Value Date/Time   COLORURINE YELLOW (A) 08/16/2024 0809   APPEARANCEUR CLOUDY (A) 08/16/2024 0809   LABSPEC 1.010 08/16/2024 0809   PHURINE 5.0 08/16/2024 0809   GLUCOSEU NEGATIVE 08/16/2024 0809   HGBUR LARGE (A) 08/16/2024 0809   BILIRUBINUR NEGATIVE 08/16/2024 0809   KETONESUR NEGATIVE 08/16/2024 0809   PROTEINUR NEGATIVE 08/16/2024 0809   NITRITE NEGATIVE 08/16/2024 0809   LEUKOCYTESUR LARGE (A) 08/16/2024 0809     Radiological Exams on Admission: I have personally reviewed images No results found.  EKG: Sinus bradycardia cardia with sinus arrhythmia    Assessment/Plan Principal Problem:   C. difficile colitis Active Problems:   Hepatic encephalopathy (HCC)   Diabetes mellitus type 2, noninsulin dependent (HCC)   Essential hypertension   CAD (coronary artery disease)   HTN (hypertension)   Weakness    Assessment and Plan: 73 year old male with multiple problems including but not limited to liver cirrhosis, recurrent hepatic encephalopathy, type 2 diabetes, HTN, HLD, CAD, CKD 3 AA, COPD, hep C, chronic indwelling Foley catheter who was brought in from skilled nursing facility for nausea vomiting diarrhea and generalized weakness for the last 2 days.  1.  Nausea vomiting severe watery diarrhea - Patient appears to be dehydrated and he is positive for C. difficile colitis - Will start him on vancomycin  by mouth per CT protocol - Will give him IV fluid hydration - He will be able to eat and drink - Will give him antinausea medication if needed  2.  Hepatic encephalopathy - Patient is alert awake and oriented - He feels generally weak - He is ammonia level is around 250. - Will keep him on lactulose  daily dose - Continue to monitor his symptoms.  3.  Possible urinary tract infection versus colonization - Patient does not have fever. - Patient was  treated for UTI in the past multiple times - I believe this could be colonization. - Patient received 1 dose of ceftriaxone  in the emergency room. -Due to C. difficile colitis and there is no obvious symptoms for UTI I will hold off antibiotics. - Follow-up cultures.  4.  COPD/CHF - Continue to monitor oxygen and provide oxygen to maintain saturation more than 90% -  Will hold off diuretics at this point due to severe dehydration - Continue nebulization - Continue to monitor respiratory symptoms  5.  HTN/HLD -Patient to take  diuretics but I am holding them due to severe dehydration. - Continue to monitor blood pressure and add medication if needed - Continue Lipitor  6.  History of CAD - Continue aspirin  and statin  7.  Type 2 diabetes - He will be placed on insulin  sliding scale  8.  AKI - Due to severe dehydration and diarrhea - Will hold off diuretics and give him gentle hydration  9.  Hyperkalemia - Potassium is 5.4 - EKG does not show any changes - After the hydration it will get better, will check in the morning     DVT prophylaxis: SQ Heparin  Code Status: Full Code Family Communication: None  Disposition Plan: SNF  Consults called: None  Admission status: Observation, Telemetry bed   Nena Rebel, MD [facility] 08/16/2024, 12:55 PM         [1]  Allergies Allergen Reactions   Bee Venom Anaphylaxis   Codeine  Itching    Only itching. Recently allergy tested at [facility] Pre-F Virus Vaccine Swelling   Meloxicam     Other Reaction(s): ?overdose of local given at dentist--had trouble breathing   Novocain [Procaine]    Sulfa Antibiotics     hallucinations   "

## 2024-08-16 NOTE — Progress Notes (Signed)
 Pharmacy Antibiotic Note  BRAYAM BOEKE is a 73 y.o. male admitted on 08/16/2024 with C. difficile diarrhea (no previous history discoverable).  PMH significant for HTN, Klebsiella bacteremia, T2DM, COPD, CHF, hepatic encephalopathy.  Pharmacy has been consulted for oral vancomycin  dosing.  Plan: start oral vancomycin  125 mg po QID  Height: 5' 6 (167.6 cm) Weight: 93.7 kg (206 lb 9.1 oz) IBW/kg (Calculated) : 63.8  Temp (24hrs), Avg:98.7 F (37.1 C), Min:98.7 F (37.1 C), Max:98.7 F (37.1 C)  Recent Labs  Lab 08/16/24 0742  WBC 3.4*  CREATININE 1.63*    Estimated Creatinine Clearance: 42 mL/min (A) (by C-G formula based on SCr of 1.63 mg/dL (H)).    Allergies[1]  Antimicrobials this admission: 12/21 vancomycin  oral >>    Microbiology results: 12/21 C difficile: Ag (+), toxin (+), Toxin producing C. difficile detected   Thank you for allowing pharmacy to be a part of this patients care.  Adriana JONETTA Bolster 08/16/2024 11:40 AM     [1]  Allergies Allergen Reactions   Bee Venom Anaphylaxis   Codeine  Itching    Only itching. Recently allergy tested at [facility] Pre-F Virus Vaccine Swelling   Meloxicam     Other Reaction(s): ?overdose of local given at dentist--had trouble breathing   Novocain [Procaine]    Sulfa Antibiotics     hallucinations

## 2024-08-17 ENCOUNTER — Telehealth (HOSPITAL_COMMUNITY): Payer: Self-pay

## 2024-08-17 ENCOUNTER — Other Ambulatory Visit (HOSPITAL_COMMUNITY): Payer: Self-pay

## 2024-08-17 DIAGNOSIS — Z8619 Personal history of other infectious and parasitic diseases: Secondary | ICD-10-CM | POA: Diagnosis not present

## 2024-08-17 DIAGNOSIS — Z8616 Personal history of COVID-19: Secondary | ICD-10-CM | POA: Diagnosis not present

## 2024-08-17 DIAGNOSIS — E669 Obesity, unspecified: Secondary | ICD-10-CM | POA: Diagnosis present

## 2024-08-17 DIAGNOSIS — I130 Hypertensive heart and chronic kidney disease with heart failure and stage 1 through stage 4 chronic kidney disease, or unspecified chronic kidney disease: Secondary | ICD-10-CM | POA: Diagnosis present

## 2024-08-17 DIAGNOSIS — N1831 Chronic kidney disease, stage 3a: Secondary | ICD-10-CM | POA: Diagnosis present

## 2024-08-17 DIAGNOSIS — Z7984 Long term (current) use of oral hypoglycemic drugs: Secondary | ICD-10-CM | POA: Diagnosis not present

## 2024-08-17 DIAGNOSIS — E785 Hyperlipidemia, unspecified: Secondary | ICD-10-CM | POA: Diagnosis present

## 2024-08-17 DIAGNOSIS — E1122 Type 2 diabetes mellitus with diabetic chronic kidney disease: Secondary | ICD-10-CM | POA: Diagnosis present

## 2024-08-17 DIAGNOSIS — I272 Pulmonary hypertension, unspecified: Secondary | ICD-10-CM | POA: Diagnosis present

## 2024-08-17 DIAGNOSIS — J4489 Other specified chronic obstructive pulmonary disease: Secondary | ICD-10-CM | POA: Diagnosis present

## 2024-08-17 DIAGNOSIS — I5032 Chronic diastolic (congestive) heart failure: Secondary | ICD-10-CM | POA: Diagnosis present

## 2024-08-17 DIAGNOSIS — E1140 Type 2 diabetes mellitus with diabetic neuropathy, unspecified: Secondary | ICD-10-CM | POA: Diagnosis present

## 2024-08-17 DIAGNOSIS — K7460 Unspecified cirrhosis of liver: Secondary | ICD-10-CM | POA: Diagnosis present

## 2024-08-17 DIAGNOSIS — Z8249 Family history of ischemic heart disease and other diseases of the circulatory system: Secondary | ICD-10-CM | POA: Diagnosis not present

## 2024-08-17 DIAGNOSIS — I2510 Atherosclerotic heart disease of native coronary artery without angina pectoris: Secondary | ICD-10-CM | POA: Diagnosis present

## 2024-08-17 DIAGNOSIS — Z8673 Personal history of transient ischemic attack (TIA), and cerebral infarction without residual deficits: Secondary | ICD-10-CM | POA: Diagnosis not present

## 2024-08-17 DIAGNOSIS — Z960 Presence of urogenital implants: Secondary | ICD-10-CM | POA: Diagnosis present

## 2024-08-17 DIAGNOSIS — Z7982 Long term (current) use of aspirin: Secondary | ICD-10-CM | POA: Diagnosis not present

## 2024-08-17 DIAGNOSIS — F329 Major depressive disorder, single episode, unspecified: Secondary | ICD-10-CM | POA: Diagnosis present

## 2024-08-17 DIAGNOSIS — E875 Hyperkalemia: Secondary | ICD-10-CM | POA: Diagnosis present

## 2024-08-17 DIAGNOSIS — E860 Dehydration: Secondary | ICD-10-CM | POA: Diagnosis present

## 2024-08-17 DIAGNOSIS — A0472 Enterocolitis due to Clostridium difficile, not specified as recurrent: Secondary | ICD-10-CM | POA: Diagnosis present

## 2024-08-17 DIAGNOSIS — K7682 Hepatic encephalopathy: Secondary | ICD-10-CM | POA: Diagnosis present

## 2024-08-17 DIAGNOSIS — R8281 Pyuria: Secondary | ICD-10-CM | POA: Diagnosis present

## 2024-08-17 LAB — GLUCOSE, CAPILLARY
Glucose-Capillary: 57 mg/dL — ABNORMAL LOW (ref 70–99)
Glucose-Capillary: 153 mg/dL — ABNORMAL HIGH (ref 70–99)
Glucose-Capillary: 113 mg/dL — ABNORMAL HIGH (ref 70–99)

## 2024-08-17 LAB — CBC
HCT: 24.7 % — ABNORMAL LOW (ref 39.0–52.0)
Hemoglobin: 8 g/dL — ABNORMAL LOW (ref 13.0–17.0)
MCH: 30.4 pg (ref 26.0–34.0)
MCHC: 32.4 g/dL (ref 30.0–36.0)
MCV: 93.9 fL (ref 80.0–100.0)
Platelets: 105 K/uL — ABNORMAL LOW (ref 150–400)
RBC: 2.63 MIL/uL — ABNORMAL LOW (ref 4.22–5.81)
RDW: 16.9 % — ABNORMAL HIGH (ref 11.5–15.5)
WBC: 3 K/uL — ABNORMAL LOW (ref 4.0–10.5)
nRBC: 0 % (ref 0.0–0.2)

## 2024-08-17 LAB — COMPREHENSIVE METABOLIC PANEL WITH GFR
ALT: 14 U/L (ref 0–44)
AST: 28 U/L (ref 15–41)
Albumin: 2.4 g/dL — ABNORMAL LOW (ref 3.5–5.0)
Alkaline Phosphatase: 69 U/L (ref 38–126)
Anion gap: 7 (ref 5–15)
BUN: 23 mg/dL (ref 8–23)
CO2: 18 mmol/L — ABNORMAL LOW (ref 22–32)
Calcium: 8.2 mg/dL — ABNORMAL LOW (ref 8.9–10.3)
Chloride: 115 mmol/L — ABNORMAL HIGH (ref 98–111)
Creatinine, Ser: 1.64 mg/dL — ABNORMAL HIGH (ref 0.61–1.24)
GFR, Estimated: 43 mL/min — ABNORMAL LOW
Glucose, Bld: 68 mg/dL — ABNORMAL LOW (ref 70–99)
Potassium: 4.4 mmol/L (ref 3.5–5.1)
Sodium: 139 mmol/L (ref 135–145)
Total Bilirubin: 0.7 mg/dL (ref 0.0–1.2)
Total Protein: 6 g/dL — ABNORMAL LOW (ref 6.5–8.1)

## 2024-08-17 LAB — PROTIME-INR
INR: 1.3 — ABNORMAL HIGH (ref 0.8–1.2)
Prothrombin Time: 16.9 s — ABNORMAL HIGH (ref 11.4–15.2)

## 2024-08-17 LAB — AMMONIA: Ammonia: 52 umol/L — ABNORMAL HIGH (ref 9–35)

## 2024-08-17 MED ORDER — QUETIAPINE FUMARATE 25 MG PO TABS
50.0000 mg | ORAL_TABLET | Freq: Every day | ORAL | Status: DC
  Administered 2024-08-17: 50 mg via ORAL
  Filled 2024-08-17: qty 2

## 2024-08-17 MED ORDER — RIFAXIMIN 550 MG PO TABS
550.0000 mg | ORAL_TABLET | Freq: Two times a day (BID) | ORAL | Status: DC
  Administered 2024-08-17 – 2024-08-18 (×2): 550 mg via ORAL
  Filled 2024-08-17 (×3): qty 1

## 2024-08-17 MED ORDER — GABAPENTIN 100 MG PO CAPS
100.0000 mg | ORAL_CAPSULE | Freq: Two times a day (BID) | ORAL | Status: DC
  Administered 2024-08-17 – 2024-08-18 (×3): 100 mg via ORAL
  Filled 2024-08-17 (×3): qty 1

## 2024-08-17 MED ORDER — CITALOPRAM HYDROBROMIDE 20 MG PO TABS
20.0000 mg | ORAL_TABLET | Freq: Every day | ORAL | Status: DC
  Administered 2024-08-17 – 2024-08-18 (×2): 20 mg via ORAL
  Filled 2024-08-17 (×2): qty 1

## 2024-08-17 NOTE — TOC Initial Note (Signed)
 Transition of Care [facility]) - Initial/Assessment Note    Patient Details  Name: [name] MRN: [id number] Date of Birth: 1951/07/10  Transition of Care [facility]) CM/SW Contact:    [name], RN Phone Number: 08/17/2024, 7:20 PM  Clinical Narrative:                  Admitted for: Cdiff Admitted from [facility]  PCP: [facility]   Current home health/prior home health/DME: [facility]     Therapy recommending HH.  Per ping patient is active with [facility].  CM to follow up to confirm   Patient Goals and CMS Choice            Expected Discharge Plan and Services                                              Prior Living Arrangements/Services                       Activities of Daily Living   ADL Screening (condition at time of admission) Independently performs ADLs?: Yes (appropriate for developmental age)  Permission Sought/Granted                  Emotional Assessment              Admission diagnosis:  Hepatic encephalopathy (HCC) [K76.82] GI bleeding [K92.2] Patient Active Problem List   Diagnosis Date Noted   GI bleeding 08/16/2024   C. difficile colitis 08/16/2024   Iron deficiency anemia 07/03/2024   Gram-negative bacteremia 06/30/2024   Sepsis due to Gram-negative organism with acute renal failure (HCC) 06/27/2024   Hyponatremia 06/27/2024   Acute on chronic anemia 06/27/2024   Dyslipidemia 06/27/2024   Peripheral neuropathy 06/27/2024   GERD without esophagitis 06/27/2024   Bacteremia 02/18/2024   Complicated UTI (urinary tract infection) 02/17/2024   Chronic indwelling Foley catheter 01/03/2024   Hypomagnesemia 12/30/2023   Depression with anxiety 12/30/2023   Altered mental status 11/12/2023   Chronic kidney disease, stage 3a (HCC) 10/21/2023   Myocardial injury 10/21/2023   CKD stage 3a, GFR 45-59 ml/min (HCC) - baseline scr 1.3 10/01/2023   Hepatic encephalopathy (HCC) 09/30/2023    Liver cirrhosis (HCC) 09/30/2023   Essential hypertension 09/30/2023   Diabetes mellitus type 2, noninsulin dependent (HCC) 09/30/2023   CAD (coronary artery disease) 09/30/2023   Hyperlipidemia 09/30/2023   Hepatic encephalopathy (HCC) 08/01/2023   Hyperammonemia 07/30/2023   Cervical spinal stenosis 07/01/2023   Cervical myelopathy (HCC) 06/21/2023   Cervical stenosis of spine 06/21/2023   Cervical radiculopathy 06/20/2023   E coli bacteremia 06/17/2023   UTI (urinary tract infection) 06/17/2023   Left leg cellulitis 03/23/2023   Leukocytosis 03/23/2023   Left leg pain 03/23/2023   Suicidal ideation 01/14/2023   Acute pain 01/14/2023   Adrenal insufficiency 12/26/2022   Orthostatic hypotension 12/24/2022   Rib pain on right side 12/24/2022   Hepatitis B infection 12/24/2022   Myocardial ischemia 12/24/2022   Acute appendicitis 11/05/2022   Pancytopenia (HCC) 11/05/2022   Sinus bradycardia 11/05/2022   Acute metabolic encephalopathy 09/22/2022   Liver lesion 09/22/2022   Obesity (BMI 30-39.9) 09/22/2022   Acute hepatic encephalopathy (HCC) 09/22/2022   MDD (major depressive disorder), recurrent severe, 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   Major depressive disorder, 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 psychotic features (HCC)  09/15/2022   Protein-calorie malnutrition, severe 08/31/2022   Hypotension 08/31/2022   Weakness    Hx of transient ischemic attack (TIA) 04/28/2022   Obesity    Chronic diastolic CHF (congestive heart failure) (HCC)    Depression    Septic shock (HCC) 03/28/2022   Bacteremia due to Klebsiella pneumoniae 03/28/2022   Hypoglycemia 03/28/2022   Syncope 03/24/2022   Hypokalemia 03/24/2022   Fever 03/24/2022   COPD (chronic obstructive pulmonary disease) (HCC)    Elevated troponin    Abnormal LFTs    History of hepatitis C    AKI (acute kidney injury)    Pure hypercholesterolemia    Obesity, Class III, BMI 40-49.9 (morbid obesity) (HCC) 04/18/2020    Thrombocytopenia 04/17/2020   Leukopenia 04/17/2020   HTN (hypertension) 04/17/2018   Uncontrolled type 2 diabetes mellitus with hyperglycemia, without long-term current use of insulin  (HCC) 04/17/2018   Lymphedema 04/17/2018   Normocytic anemia 04/07/2018   Chest pain 03/20/2018   Unsheltered homelessness 12/16/2017   PCP:  [facility] Pharmacy:   [facility] [address] [place] [address] Phone: [phone] Fax: [phone]  [facility] - [place], [place] - [place] [address] [place] [address] Phone: [phone] Fax: [phone]     Social Drivers of Health (SDOH) Social History: SDOH Screenings   Food Insecurity: Patient Unable To Answer (08/01/2024)  Housing: Low Risk (06/29/2024)  Transportation Needs: No Transportation Needs (06/28/2024)  Utilities: Not At Risk (06/28/2024)  Alcohol Screen: Low Risk (09/24/2022)  Depression (PHQ2-9): High Risk (04/26/2022)  Financial Resource Strain: High Risk (04/27/2022)  Social Connections: Socially Isolated (06/28/2024)  Tobacco Use: High Risk (08/16/2024)   SDOH Interventions:     Readmission Risk Interventions    08/17/2024    7:19 PM 11/29/2023    5:03 PM 11/13/2023    2:21 PM  Readmission Risk Prevention Plan  Transportation Screening Complete Not Complete   Medication Review [employer]) Complete Not Complete Complete  PCP or Specialist appointment within 3-5 days of discharge  Not Complete Complete  HRI or Home Care Consult Complete Not Complete Complete  SW Recovery Care/Counseling Consult  Not Complete Complete  Palliative Care Screening Not Applicable Not Applicable Complete  Skilled Nursing Facility Not Applicable Not Applicable Not Applicable

## 2024-08-17 NOTE — Evaluation (Signed)
 Physical Therapy Evaluation Patient Details Name: [name] MRN: [id number] DOB: 08/28/1950 Today's Date: 08/17/2024  History of Present Illness  [name] is a 73 y.o. male admitted on 08/16/2024 with C. difficile diarrhea (no previous history discoverable).  PMH significant for HTN, Klebsiella bacteremia, T2DM, COPD, CHF, hepatic encephalopathy.  Clinical Impression  Patient noted to be in supine position at PT arrival in room, for an initial PT evaluation due to a decline in functional status, with baseline mobility reported as modI with history of falls, and currently requiring CGA progressing to modI for hallway ambulation fo 180' feet. The patient is A&O x 4, presenting with good willingness to work with PT. Gait was assessed with no AD. The overall clinical impression is that the patient presents with little to no mobility limitations. Recommended skilled PT will address safety, mobility, and discharge planning.       If plan is discharge home, recommend the following: A little help with bathing/dressing/bathroom;A little help with walking and/or transfers   Can travel by private vehicle        Equipment Recommendations None recommended by PT  Recommendations for Other Services       Functional Status Assessment Patient has had a recent decline in their functional status and demonstrates the ability to make significant improvements in function in a reasonable and predictable amount of time.     Precautions / Restrictions Precautions Precautions: Fall Restrictions Weight Bearing Restrictions Per Provider Order: No      Mobility  Bed Mobility Overal bed mobility: Needs Assistance Bed Mobility: Supine to Sit, Sit to Supine     Supine to sit: Supervision, Modified independent (Device/Increase time) Sit to supine: Contact guard assist, Supervision   General bed mobility comments: assistance provide to reposition in bed; foot of bed elevated     Transfers Overall transfer level: Needs assistance   Transfers: Sit to/from Stand Sit to Stand: Modified independent (Device/Increase time), Supervision                Ambulation/Gait Ambulation/Gait assistance: Supervision, Modified independent (Device/Increase time) Gait Distance (Feet): 180 Feet Assistive device: None Gait Pattern/deviations: Step-through pattern, Drifts right/left, Trendelenburg Gait velocity: WNL        Stairs            Wheelchair Mobility     Tilt Bed    Modified Rankin (Stroke Patients Only)       Balance Overall balance assessment: Needs assistance Sitting-balance support: Feet supported, No upper extremity supported Sitting balance-Leahy Scale: Normal Sitting balance - Comments: min/mod assist to maintain   Standing balance support: During functional activity Standing balance-Leahy Scale: Good               High level balance activites: Direction changes, Sudden stops, Side stepping High Level Balance Comments: slightly unbalance during high level task has to reach for objects to stablize             Pertinent Vitals/Pain Pain Assessment Pain Assessment: No/denies pain    Home Living Family/patient expects to be discharged to:: Skilled nursing facility                   Additional Comments: Patient poor historian; unable to provide details of group home environment.  Per previous documenation, has ramp access to enter/exit    Prior Function Prior Level of Function : Patient poor historian/Family not available             Mobility Comments: Pt  reports being IND with mobility with a rollator, hx of frequent falls-will verify with family/facility as available       Extremity/Trunk Assessment   Upper Extremity Assessment Upper Extremity Assessment: Overall WFL for tasks assessed    Lower Extremity Assessment Lower Extremity Assessment: Overall WFL for tasks assessed    Cervical / Trunk  Assessment Cervical / Trunk Assessment: Kyphotic  Communication   Communication Communication: No apparent difficulties    Cognition Arousal: Alert Behavior During Therapy: WFL for tasks assessed/performed   PT - Cognitive impairments: No apparent impairments                         Following commands: Intact       Cueing Cueing Techniques: Verbal cues     General Comments      Exercises     Assessment/Plan    PT Assessment All further PT needs can be met in the next venue of care  PT Problem List Decreased strength;Decreased activity tolerance;Decreased balance;Decreased mobility       PT Treatment Interventions DME instruction;Gait training;Functional mobility training;Therapeutic activities;Therapeutic exercise;Balance training;Cognitive remediation;Patient/family education    PT Goals (Current goals can be found in the Care Plan section)  Acute Rehab PT Goals Patient Stated Goal: Pt wants to return to group home PT Goal Formulation: With patient Time For Goal Achievement: 09/03/24 Potential to Achieve Goals: Good    Frequency Min 2X/week     Co-evaluation               AM-PAC PT 6 Clicks Mobility  Outcome Measure Help needed turning from your back to your side while in a flat bed without using bedrails?: None Help needed moving from lying on your back to sitting on the side of a flat bed without using bedrails?: None Help needed moving to and from a bed to a chair (including a wheelchair)?: None Help needed standing up from a chair using your arms (e.g., wheelchair or bedside chair)?: A Little Help needed to walk in hospital room?: A Little Help needed climbing 3-5 steps with a railing? : A Little 6 Click Score: 21    End of Session   Activity Tolerance: Patient tolerated treatment well Patient left: in bed;with call bell/phone within reach;with bed alarm set Nurse Communication: Mobility status PT Visit Diagnosis: Other  abnormalities of gait and mobility (R26.89)    Time: 1000-1018 PT Time Calculation (min) (ACUTE ONLY): 18 min   Charges:   PT Evaluation $PT Eval Low Complexity: 1 Low   PT General Charges $$ ACUTE PT VISIT: 1 Visit         Drew Lesches DPT, PT    [name] 08/17/2024, 10:57 AM

## 2024-08-17 NOTE — Evaluation (Signed)
 Occupational Therapy Evaluation Patient Details Name: [name] MRN: [id number] DOB: 1951-06-18 Today's Date: 08/17/2024   History of Present Illness   [name] is a 73 y.o. male admitted on 08/16/2024 with C. difficile diarrhea (no previous history discoverable).  PMH significant for HTN, Klebsiella bacteremia, T2DM, COPD, CHF, hepatic encephalopathy.     Clinical Impressions Upon entering the room, pt supine in bed and agreeable to OT intervention. Pt reports living at group home and using RW for mobility at baseline. Staff assist with ADLs. No family or staff present to confirm baseline. Pt performs bed mobility without assistance. Pt needing max A to don B socks and would need assistance at baseline as well.  Pt stands with RW and ambulates with supervision 150' without LOB. Pt is close to functional baseline. Pt does not need skilled acute OT intervention at this time. OT to complete orders at this time.      If plan is discharge home, recommend the following:   A little help with bathing/dressing/bathroom;Assistance with cooking/housework;Assist for transportation;Help with stairs or ramp for entrance;Supervision due to cognitive status;Direct supervision/assist for medications management;Direct supervision/assist for financial management     Functional Status Assessment   Patient has had a recent decline in their functional status and demonstrates the ability to make significant improvements in function in a reasonable and predictable amount of time.     Equipment Recommendations   None recommended by OT      Precautions/Restrictions   Precautions Precautions: Fall     Mobility Bed Mobility Overal bed mobility: Needs Assistance Bed Mobility: Supine to Sit, Sit to Supine     Supine to sit: Supervision Sit to supine: Supervision        Transfers Overall transfer level: Needs assistance Equipment used: 1 person hand held assist, Rolling walker  (2 wheels) Transfers: Sit to/from Stand Sit to Stand: Modified independent (Device/Increase time), Supervision                  Balance Overall balance assessment: Needs assistance Sitting-balance support: Feet supported, No upper extremity supported Sitting balance-Leahy Scale: Normal     Standing balance support: During functional activity Standing balance-Leahy Scale: Good                             ADL either performed or assessed with clinical judgement   ADL Overall ADL's : At baseline                                       General ADL Comments: Supervision for functional transfers with RW. Pt needs assist with LB ADLs at baseline. Max A to don B socks.     Vision Patient Visual Report: No change from baseline              Pertinent Vitals/Pain Pain Assessment Pain Assessment: No/denies pain     Extremity/Trunk Assessment Upper Extremity Assessment Upper Extremity Assessment: Overall WFL for tasks assessed   Lower Extremity Assessment Lower Extremity Assessment: Overall WFL for tasks assessed   Cervical / Trunk Assessment Cervical / Trunk Assessment: Kyphotic   Communication Communication Communication: No apparent difficulties   Cognition Arousal: Alert Behavior During Therapy: WFL for tasks assessed/performed  Following commands: Intact Following commands impaired: Follows one step commands inconsistently     Cueing  General Comments   Cueing Techniques: Verbal cues              Home Living Family/patient expects to be discharged to:: Skilled nursing facility Living Arrangements: Group Home   Type of Home: Group Home                           Additional Comments: Patient poor historian; unable to provide details of group home environment.  Per previous documenation, has ramp access to enter/exit      Prior Functioning/Environment Prior Level of  Function : Patient poor historian/Family not available             Mobility Comments: Pt reports being IND with mobility with a rollator, hx of frequent falls-will verify with family/facility as available ADLs Comments: Staff at group home assist with IADLs     AM-PAC OT 6 Clicks Daily Activity     Outcome Measure Help from another person eating meals?: None Help from another person taking care of personal grooming?: None Help from another person toileting, which includes using toliet, bedpan, or urinal?: None Help from another person bathing (including washing, rinsing, drying)?: A Little Help from another person to put on and taking off regular upper body clothing?: None Help from another person to put on and taking off regular lower body clothing?: A Little 6 Click Score: 22   End of Session Equipment Utilized During Treatment: Rolling walker (2 wheels) Nurse Communication: Mobility status  Activity Tolerance: Patient tolerated treatment well Patient left: in bed;with call bell/phone within reach;with bed alarm set                   Time: 1002-1017 OT Time Calculation (min): 15 min Charges:  OT General Charges $OT Visit: 1 Visit OT Evaluation $OT Eval Low Complexity: 1 Low  Drew Claude, MS, OTR/L , CBIS ascom [phone]  08/17/2024, 1:54 PM

## 2024-08-17 NOTE — Progress Notes (Signed)
 Mobility Specialist Progress Note:    08/17/24 1618  Mobility  Activity Ambulated with assistance  Level of Assistance Standby assist, set-up cues, supervision of patient - no hands on  Assistive Device None  Distance Ambulated (ft) 12 ft  Range of Motion/Exercises Active;All extremities  Activity Response Tolerated well  Mobility visit 1 Mobility  Mobility Specialist Start Time (ACUTE ONLY) 1610  Mobility Specialist Stop Time (ACUTE ONLY) 1618  Mobility Specialist Time Calculation (min) (ACUTE ONLY) 8 min   Assisted pt to bathroom, required supervision to stand and ambulate with no AD. Tolerated well, asx throughout. Returned pt fowlers, alarm on and all needs met.  Sherrilee Ditty Mobility Specialist Please contact via Special Educational Needs Teacher or  Rehab office at [phone]

## 2024-08-17 NOTE — Telephone Encounter (Signed)
 Pharmacy Patient Advocate Encounter  Insurance verification completed.    The patient is insured through [facility]. Patient has Medicare and is not eligible for a copay card, but may be able to apply for patient assistance or Medicare RX Payment Plan (Patient Must reach out to their plan, if eligible for payment plan), if available.    Ran test claim for Vancomycin  125mg  capsules and the current 30 day co-pay is $0.  Ran test claim for Generic Dificid 200mg  tablets and the current 30 day co-pay is $0.  This test claim was processed through [employer]- copay amounts may vary at other pharmacies due to [employer], or as the patient moves through the different stages of their insurance plan.

## 2024-08-17 NOTE — Progress Notes (Signed)
 " PROGRESS NOTE    [name]  FMW:[id number] DOB: Oct 30, 1950 DOA: 08/16/2024 PCP: [facility]     Brief Narrative:   From admission h and p  [name] is a pleasant 73 y.o. male with medical history significant for recurrent hepatic encephalopathy, COPD, CHF, HTN, DM who was recently admitted and discharged after being treated for hepatic encephalopathy between 07/31/2024 and 08/06/2024, came in to the hospital complaining of generalized weakness and diarrhea for the last 2 days.  Patient stated that he had 7 or 8 episodes of diarrhea yesterday without any fever or chills.  He also had some episodes of nausea and -2 episodes  of vomiting.  Patient has a history of hepatic encephalopathy but he was able to provide meaningful history.  He complains some abdominal discomfort but he says this is not bad.  He denies any cough, shortness of breath, palpitations.  He denies any blood in the stool or hematemesis.  He stated that due to multiple episodes of diarrhea he is feeling weak to the point that he is not able to ambulate.   Assessment & Plan:   Principal Problem:   C. difficile colitis Active Problems:   Hepatic encephalopathy (HCC)   Diabetes mellitus type 2, noninsulin dependent (HCC)   Essential hypertension   CAD (coronary artery disease)   HTN (hypertension)   Weakness  # Diarrhea C diff positive, patient has had multiple hospitalizations and recently received IV abx for pyuria. So we will treat. But patient also on lactulose  and it is possible the diarrhea is secondary to lactulose . Appears to be first instance of c diff. Diarrhea improving. Abdomen benign. Nonsevere. - continue oral vanc  # Cirrhosis With history hepatic encephalopathy. 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 Says one bm today  Objective: Vitals:   08/16/24 1702 08/16/24 1949 08/17/24 0515 08/17/24 0932  BP: (!) 148/60 (!) 128/51 (!) 110/50 (!) 126/54  Pulse: (!) 45 (!) 50 (!) 49 (!) 52  Resp: 16 20 16 17   Temp: 98.2 F (36.8 C) 98.2 F (36.8 C) 97.8 F (36.6 C) 98.7 F (37.1 C)  TempSrc: Oral Oral    SpO2: 100% 100% 100% 100%  Weight:      Height:        Intake/Output Summary (Last 24 hours) at 08/17/2024 1157 Last data filed at 08/17/2024 1008 Gross per 24 hour  Intake 1876.05 ml  Output 1850 ml  Net 26.05 ml   Filed Weights   08/16/24 0740  Weight: 93.7 kg    Examination:  General exam: Appears calm and comfortable  Respiratory system: Clear to auscultation. Respiratory effort normal. Cardiovascular system: S1 & S2 heard, RRR.   Gastrointestinal system: Abdomen is nondistended, soft and nontender. Suprapubic catheter in place Central nervous system: Alert and oriented. No focal  neurological deficits. Extremities: Symmetric 5 x 5 power. Edema to knees Skin: No rashes, lesions or ulcers Psychiatry: Judgement and insight appear normal. Mood & affect appropriate.     Data Reviewed: I have personally reviewed following labs and imaging studies  CBC: Recent Labs  Lab 08/16/24 0742 08/16/24 1340 08/17/24 0503  WBC 3.4* 2.8* 3.0*  HGB 8.6* 8.8* 8.0*  HCT 27.1* 27.7* 24.7*  MCV 95.1 95.5 93.9  PLT 103* 110* 105*   Basic Metabolic Panel: Recent Labs  Lab 08/16/24 0742 08/16/24 1340  08/17/24 0503  NA 140 143 139  K 5.4* 4.7 4.4  CL 113* 117* 115*  CO2 20* 20* 18*  GLUCOSE 87 74 68*  BUN 23 22 23   CREATININE 1.63* 1.51* 1.64*  CALCIUM  8.8* 8.7* 8.2*   GFR: Estimated Creatinine Clearance: 41.7 mL/min (A) (by C-G formula based on SCr of 1.64 mg/dL (H)). Liver Function Tests: Recent Labs  Lab 08/16/24 0742 08/17/24 0503  AST 48* 28  ALT 20 14  ALKPHOS 99 69  BILITOT 0.7 0.7  PROT 6.9 6.0*  ALBUMIN  2.8* 2.4*   No results for input(s): LIPASE, AMYLASE in the last 168 hours. Recent Labs  Lab 08/16/24 0920 08/17/24 0503  AMMONIA 243* 52*   Coagulation Profile: Recent Labs  Lab 08/17/24 0503  INR 1.3*   Cardiac Enzymes: No results for input(s): CKTOTAL, CKMB, CKMBINDEX, TROPONINI in the last 168 hours. BNP (last 3 results) Recent Labs    08/01/24 0204  PROBNP 655.0*   HbA1C: Recent Labs    08/16/24 1340  HGBA1C 4.8   CBG: Recent Labs  Lab 08/16/24 1710 08/16/24 2030 08/17/24 0927 08/17/24 1144  GLUCAP 99 110* 57* 153*   Lipid Profile: No results for input(s): CHOL, HDL, LDLCALC, TRIG, CHOLHDL, LDLDIRECT in the last 72 hours. Thyroid  Function Tests: No results for input(s): TSH, T4TOTAL, FREET4, T3FREE, THYROIDAB in the last 72 hours. Anemia Panel: No results for input(s): VITAMINB12, FOLATE, FERRITIN, TIBC, IRON, RETICCTPCT in the last 72 hours. Urine analysis:    Component Value Date/Time   COLORURINE YELLOW (A) 08/16/2024 0809   APPEARANCEUR CLOUDY (A) 08/16/2024 0809   LABSPEC 1.010 08/16/2024 0809   PHURINE 5.0 08/16/2024 0809   GLUCOSEU NEGATIVE 08/16/2024 0809   HGBUR LARGE (A) 08/16/2024 0809   BILIRUBINUR NEGATIVE 08/16/2024 0809   KETONESUR NEGATIVE 08/16/2024 0809   PROTEINUR NEGATIVE 08/16/2024 0809   NITRITE NEGATIVE 08/16/2024 0809   LEUKOCYTESUR LARGE (A) 08/16/2024 0809   Sepsis Labs: @LABRCNTIP (procalcitonin:4,lacticidven:4)  ) Recent Results (from the past  240 hours)  C Difficile Quick Screen w PCR reflex     Status: Abnormal   Collection Time: 08/16/24  9:20 AM   Specimen: STOOL  Result Value Ref Range Status   C Diff antigen POSITIVE (A) NEGATIVE Final   C Diff toxin POSITIVE (A) NEGATIVE Final   C Diff interpretation Toxin producing C. difficile detected.  Final    Comment: CRITICAL RESULT CALLED TO, READ BACK BY AND VERIFIED WITH: HECTOR GARCIA ON 08/16/24 AT 1115 QSD Performed at [facility], [address] [place], [place] [address]   Resp panel by RT-PCR (RSV, Flu A&B, Covid) Anterior Nasal Swab     Status: None   Collection Time: 08/16/24  9:20 AM   Specimen: Anterior Nasal Swab  Result Value Ref Range Status   SARS Coronavirus 2 by RT PCR NEGATIVE NEGATIVE Final    Comment: (NOTE) SARS-CoV-2 target nucleic acids are NOT DETECTED.  The SARS-CoV-2 RNA is  generally detectable in upper respiratory specimens during the acute phase of infection. The lowest concentration of SARS-CoV-2 viral copies this assay can detect is 138 copies/mL. A negative result does not preclude SARS-Cov-2 infection and should not be used as the sole basis for treatment or other patient management decisions. A negative result may occur with  improper specimen collection/handling, submission of specimen other than nasopharyngeal swab, presence of viral mutation(s) within the areas targeted by this assay, and inadequate number of viral copies(<138 copies/mL). A negative result must be combined with clinical observations, patient history, and epidemiological information. The expected result is Negative.  Fact Sheet for Patients:  bloggercourse.com  Fact Sheet for Healthcare Providers:  seriousbroker.it  This test is no t yet approved or cleared by the [place]  FDA and  has been authorized for detection and/or diagnosis of SARS-CoV-2 by FDA under an Emergency Use Authorization (EUA). This  EUA will remain  in effect (meaning this test can be used) for the duration of the COVID-19 declaration under Section 564(b)(1) of the Act, 21 U.S.C.section 360bbb-3(b)(1), unless the authorization is terminated  or revoked sooner.       Influenza A by PCR NEGATIVE NEGATIVE Final   Influenza B by PCR NEGATIVE NEGATIVE Final    Comment: (NOTE) The Xpert Xpress SARS-CoV-2/FLU/RSV plus assay is intended as an aid in the diagnosis of influenza from Nasopharyngeal swab specimens and should not be used as a sole basis for treatment. Nasal washings and aspirates are unacceptable for Xpert Xpress SARS-CoV-2/FLU/RSV testing.  Fact Sheet for Patients: bloggercourse.com  Fact Sheet for Healthcare Providers: seriousbroker.it  This test is not yet approved or cleared by the [place]  FDA and has been authorized for detection and/or diagnosis of SARS-CoV-2 by FDA under an Emergency Use Authorization (EUA). This EUA will remain in effect (meaning this test can be used) for the duration of the COVID-19 declaration under Section 564(b)(1) of the Act, 21 U.S.C. section 360bbb-3(b)(1), unless the authorization is terminated or revoked.     Resp Syncytial Virus by PCR NEGATIVE NEGATIVE Final    Comment: (NOTE) Fact Sheet for Patients: bloggercourse.com  Fact Sheet for Healthcare Providers: seriousbroker.it  This test is not yet approved or cleared by the [place]  FDA and has been authorized for detection and/or diagnosis of SARS-CoV-2 by FDA under an Emergency Use Authorization (EUA). This EUA will remain in effect (meaning this test can be used) for the duration of the COVID-19 declaration under Section 564(b)(1) of the Act, 21 U.S.C. section 360bbb-3(b)(1), unless the authorization is terminated or revoked.  Performed at [facility], [address] [place], [place]  [address]   Gastrointestinal Panel by PCR , Stool     Status: None   Collection Time: 08/16/24  9:20 AM  Result Value Ref Range Status   Campylobacter species NOT DETECTED NOT DETECTED Final   Plesimonas shigelloides NOT DETECTED NOT DETECTED Final   Salmonella species NOT DETECTED NOT DETECTED Final   Yersinia enterocolitica NOT DETECTED NOT DETECTED Final   Vibrio species NOT DETECTED NOT DETECTED Final   Vibrio cholerae NOT DETECTED NOT DETECTED Final   Enteroaggregative E coli (EAEC) NOT DETECTED NOT DETECTED Final   Enteropathogenic E coli (EPEC) NOT DETECTED NOT DETECTED Final   Enterotoxigenic E coli (ETEC) NOT DETECTED NOT DETECTED Final   Shiga like toxin producing E coli (STEC) NOT DETECTED NOT DETECTED Final   Shigella/Enteroinvasive E coli (EIEC) NOT DETECTED NOT DETECTED Final   Cryptosporidium NOT DETECTED NOT DETECTED  Final   Cyclospora cayetanensis NOT DETECTED NOT DETECTED Final   Entamoeba histolytica NOT DETECTED NOT DETECTED Final   Giardia lamblia NOT DETECTED NOT DETECTED Final   Adenovirus F40/41 NOT DETECTED NOT DETECTED Final   Astrovirus NOT DETECTED NOT DETECTED Final   Norovirus GI/GII NOT DETECTED NOT DETECTED Final   Rotavirus A NOT DETECTED NOT DETECTED Final   Sapovirus (I, II, IV, and V) NOT DETECTED NOT DETECTED Final    Comment: Performed at [facility], [address], [place], [place] [address]         Radiology Studies: No results found.      Scheduled Meds:  atorvastatin   40 mg Oral Daily   Chlorhexidine  Gluconate Cloth  6 each Topical Daily   citalopram   20 mg Oral Daily   gabapentin   100 mg Oral BID   heparin   5,000 Units Subcutaneous Q8H   lactulose   20 g Oral Once   pantoprazole   40 mg Oral Daily   rifaximin   550 mg Oral BID   vancomycin   125 mg Oral QID   Continuous Infusions:   LOS: 0 days     Devaughn KATHEE Ban, MD [facility]   If 7PM-7AM, please contact night-coverage [email or web address] Password  [facility] 08/17/2024, 11:57 AM     "

## 2024-08-18 ENCOUNTER — Other Ambulatory Visit: Payer: Self-pay

## 2024-08-18 DIAGNOSIS — A0472 Enterocolitis due to Clostridium difficile, not specified as recurrent: Secondary | ICD-10-CM | POA: Diagnosis not present

## 2024-08-18 LAB — CBC
HCT: 25 % — ABNORMAL LOW (ref 39.0–52.0)
Hemoglobin: 8 g/dL — ABNORMAL LOW (ref 13.0–17.0)
MCH: 30.7 pg (ref 26.0–34.0)
MCHC: 32 g/dL (ref 30.0–36.0)
MCV: 95.8 fL (ref 80.0–100.0)
Platelets: 88 K/uL — ABNORMAL LOW (ref 150–400)
RBC: 2.61 MIL/uL — ABNORMAL LOW (ref 4.22–5.81)
RDW: 17 % — ABNORMAL HIGH (ref 11.5–15.5)
WBC: 3 K/uL — ABNORMAL LOW (ref 4.0–10.5)
nRBC: 0 % (ref 0.0–0.2)

## 2024-08-18 LAB — BASIC METABOLIC PANEL WITH GFR
Anion gap: 8 (ref 5–15)
BUN: 21 mg/dL (ref 8–23)
CO2: 17 mmol/L — ABNORMAL LOW (ref 22–32)
Calcium: 8.4 mg/dL — ABNORMAL LOW (ref 8.9–10.3)
Chloride: 115 mmol/L — ABNORMAL HIGH (ref 98–111)
Creatinine, Ser: 1.75 mg/dL — ABNORMAL HIGH (ref 0.61–1.24)
GFR, Estimated: 40 mL/min — ABNORMAL LOW
Glucose, Bld: 96 mg/dL (ref 70–99)
Potassium: 4.7 mmol/L (ref 3.5–5.1)
Sodium: 140 mmol/L (ref 135–145)

## 2024-08-18 LAB — GLUCOSE, CAPILLARY
Glucose-Capillary: 62 mg/dL — ABNORMAL LOW (ref 70–99)
Glucose-Capillary: 85 mg/dL (ref 70–99)

## 2024-08-18 MED ORDER — LACTULOSE 10 GM/15ML PO SOLN: 30.0000 g | Freq: Three times a day (TID) | ORAL | Status: AC

## 2024-08-18 MED ORDER — VANCOMYCIN HCL 125 MG PO CAPS
125.0000 mg | ORAL_CAPSULE | Freq: Four times a day (QID) | ORAL | 0 refills | Status: AC
  Filled 2024-08-18: qty 32, 8d supply, fill #0

## 2024-08-18 MED ORDER — XIFAXAN 550 MG PO TABS
550.0000 mg | ORAL_TABLET | Freq: Two times a day (BID) | ORAL | 0 refills | Status: AC
  Filled 2024-08-18: qty 60, 30d supply, fill #0

## 2024-08-18 MED ORDER — OXYCODONE HCL 5 MG PO TABS
5.0000 mg | ORAL_TABLET | Freq: Once | ORAL | Status: AC
  Administered 2024-08-18: 5 mg via ORAL
  Filled 2024-08-18: qty 1

## 2024-08-18 NOTE — TOC Transition Note (Signed)
 Transition of Care [facility]) - Discharge Note   Patient Details  Name: [name] MRN: [id number] Date of Birth: 10/17/1950  [facility] CM/SW Contact:  [name], RN Phone Number: 08/18/2024, 11:13 AM   Clinical Narrative:     Patient to dc today and return ton Care home Bedside RN has spoken to facility and provided report Per [facility] care home owner one of the Car provider are to provide transport Patient active with [facility] for PT OT, Channing with [facility] notifed of discharge DC summary and Fl2 placed in dc packet by unit secretary   Final next level of care: Group Home (family care home) Barriers to Discharge: Barriers Resolved   Patient Goals and CMS Choice            Discharge Placement                       Discharge Plan and Services Additional resources added to the After Visit Summary for                            [facility] Arranged: PT, OT [facility] Agency: [facility] Date [facility] Agency Contacted: 08/18/24   Representative spoke with at [facility] Agency: Channing  Social Drivers of Health (SDOH) Interventions SDOH Screenings   Food Insecurity: Patient Unable To Answer (08/01/2024)  Housing: Low Risk (06/29/2024)  Transportation Needs: No Transportation Needs (06/28/2024)  Utilities: Not At Risk (06/28/2024)  Alcohol Screen: Low Risk (09/24/2022)  Depression (PHQ2-9): High Risk (04/26/2022)  Financial Resource Strain: High Risk (04/27/2022)  Social Connections: Socially Isolated (06/28/2024)  Tobacco Use: High Risk (08/16/2024)     Readmission Risk Interventions    08/17/2024    7:19 PM 11/29/2023    5:03 PM 11/13/2023    2:21 PM  Readmission Risk Prevention Plan  Transportation Screening Complete Not Complete   Medication Review Oceanographer) Complete Not Complete Complete  PCP or Specialist appointment within 3-5 days of discharge  Not Complete Complete  HRI or Home Care Consult Complete Not Complete Complete  SW  Recovery Care/Counseling Consult  Not Complete Complete  Palliative Care Screening Not Applicable Not Applicable Complete  Skilled Nursing Facility Not Applicable Not Applicable Not Applicable

## 2024-08-18 NOTE — Plan of Care (Signed)

## 2024-08-18 NOTE — NC FL2 (Signed)
 " [place]  MEDICAID FL2 LEVEL OF CARE FORM     IDENTIFICATION  Patient Name: [name] Birthdate: 03-Jan-1951 Sex: male Admission Date (Current Location): 08/16/2024  [facility] and [place] Number:  Chiropodist and Address:         Provider Number: [id number]  Attending Physician Name and Address:  Kandis Devaughn Sayres, MD  Relative Name and Phone Number:       Current Level of Care: Hospital Recommended Level of Care: [facility] Prior Approval Number:    Date Approved/Denied:   PASRR Number:    Discharge Plan: Other (Comment) (Family care home)    Current Diagnoses: Patient Active Problem List   Diagnosis Date Noted   GI bleeding 08/16/2024   C. difficile colitis 08/16/2024   Iron deficiency anemia 07/03/2024   Gram-negative bacteremia 06/30/2024   Sepsis due to Gram-negative organism with acute renal failure (HCC) 06/27/2024   Hyponatremia 06/27/2024   Acute on chronic anemia 06/27/2024   Dyslipidemia 06/27/2024   Peripheral neuropathy 06/27/2024   GERD without esophagitis 06/27/2024   Bacteremia 02/18/2024   Complicated UTI (urinary tract infection) 02/17/2024   Chronic indwelling Foley catheter 01/03/2024   Hypomagnesemia 12/30/2023   Depression with anxiety 12/30/2023   Altered mental status 11/12/2023   Chronic kidney disease, stage 3a (HCC) 10/21/2023   Myocardial injury 10/21/2023   CKD stage 3a, GFR 45-59 ml/min (HCC) - baseline scr 1.3 10/01/2023   Hepatic encephalopathy (HCC) 09/30/2023   Liver cirrhosis (HCC) 09/30/2023   Essential hypertension 09/30/2023   Diabetes mellitus type 2, noninsulin dependent (HCC) 09/30/2023   CAD (coronary artery disease) 09/30/2023   Hyperlipidemia 09/30/2023   Hepatic encephalopathy (HCC) 08/01/2023   Hyperammonemia 07/30/2023   Cervical spinal stenosis 07/01/2023   Cervical myelopathy (HCC) 06/21/2023   Cervical stenosis of spine 06/21/2023   Cervical radiculopathy 06/20/2023   E coli  bacteremia 06/17/2023   UTI (urinary tract infection) 06/17/2023   Left leg cellulitis 03/23/2023   Leukocytosis 03/23/2023   Left leg pain 03/23/2023   Suicidal ideation 01/14/2023   Acute pain 01/14/2023   Adrenal insufficiency 12/26/2022   Orthostatic hypotension 12/24/2022   Rib pain on right side 12/24/2022   Hepatitis B infection 12/24/2022   Myocardial ischemia 12/24/2022   Acute appendicitis 11/05/2022   Pancytopenia (HCC) 11/05/2022   Sinus bradycardia 11/05/2022   Acute metabolic encephalopathy 09/22/2022   Liver lesion 09/22/2022   Obesity (BMI 30-39.9) 09/22/2022   Acute hepatic encephalopathy (HCC) 09/22/2022   MDD (major depressive disorder), recurrent severe, 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   Major depressive disorder, 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 psychotic features (HCC) 09/15/2022   Protein-calorie malnutrition, severe 08/31/2022   Hypotension 08/31/2022   Weakness    Hx of transient ischemic attack (TIA) 04/28/2022   Obesity    Chronic diastolic CHF (congestive heart failure) (HCC)    Depression    Septic shock (HCC) 03/28/2022   Bacteremia due to Klebsiella pneumoniae 03/28/2022   Hypoglycemia 03/28/2022   Syncope 03/24/2022   Hypokalemia 03/24/2022   Fever 03/24/2022   COPD (chronic obstructive pulmonary disease) (HCC)    Elevated troponin    Abnormal LFTs    History of hepatitis C    AKI (acute kidney injury)    Pure hypercholesterolemia    Obesity, Class III, BMI 40-49.9 (morbid obesity) (HCC) 04/18/2020   Thrombocytopenia 04/17/2020   Leukopenia 04/17/2020   HTN (hypertension) 04/17/2018   Uncontrolled type 2 diabetes mellitus with hyperglycemia, without long-term current use of insulin  (HCC)  04/17/2018   Lymphedema 04/17/2018   Normocytic anemia 04/07/2018   Chest pain 03/20/2018   Unsheltered homelessness 12/16/2017    Orientation RESPIRATION BLADDER Height & Weight     Self, Time, Situation, Place  Normal Indwelling catheter Weight: 93.7  kg Height:  5' 6 (167.6 cm)  BEHAVIORAL SYMPTOMS/MOOD NEUROLOGICAL BOWEL NUTRITION STATUS      Continent Diet (regular)  AMBULATORY STATUS COMMUNICATION OF NEEDS Skin   Supervision Verbally Bruising                       Personal Care Assistance Level of Assistance              Functional Limitations Info             SPECIAL CARE FACTORS FREQUENCY  PT (By licensed PT), OT (By licensed OT)     PT Frequency: [facility] OT Frequency: [facility]            Contractures Contractures Info: Not present    Additional Factors Info  Code Status Code Status Info: Full Allergies Info: Bee Venom, Codeine , Rsv Mrna Pre-f Virus Vaccine, Meloxicam, Novocain (Procaine), Sulfa Antibiotics           Medication List       TAKE these medications     acetaminophen  650 MG CR tablet Commonly known as: TYLENOL  Take 650 mg by mouth every 8 (eight) hours as needed for pain.    aspirin  81 MG chewable tablet Chew 81 mg by mouth daily.    atorvastatin  40 MG tablet Commonly known as: LIPITOR Take 40 mg by mouth daily.    benzocaine  10 % mucosal gel Commonly known as: ORAJEL Apply to the tip of the penis for penile pain up to four times daily prn for penile pain    citalopram  20 MG tablet Commonly known as: CELEXA  Take 20 mg by mouth daily.    feeding supplement Liqd Take 237 mLs by mouth 3 (three) times daily between meals.    furosemide  40 MG tablet Commonly known as: LASIX  Take 40 mg by mouth daily.    gabapentin  300 MG capsule Commonly known as: NEURONTIN  Take 300 mg by mouth 2 (two) times daily.    lactulose  10 GM/15ML solution Commonly known as: CHRONULAC  Take 45 mLs (30 g total) by mouth 3 (three) times daily. Please titrate to have 2-3 soft bowel movements daily What changed: how much to take    methocarbamol  500 MG tablet Commonly known as: ROBAXIN  Take 1 tablet (500 mg total) by mouth every 8 (eight) hours as needed for muscle spasms.    multivitamin  with minerals Tabs tablet Take 1 tablet by mouth daily.    nitroGLYCERIN  0.4 MG/SPRAY spray Commonly known as: NITROLINGUAL  Place 1 spray under the tongue every 5 (five) minutes x 3 doses as needed for chest pain.    nystatin  powder Apply 1 Application topically 3 (three) times daily.    ondansetron  4 MG tablet Commonly known as: ZOFRAN  Take 1 tablet (4 mg total) by mouth every 6 (six) hours as needed for nausea.    pantoprazole  40 MG tablet Commonly known as: PROTONIX  Take 40 mg by mouth daily.    phenol 1.4 % Liqd Commonly known as: CHLORASEPTIC Use as directed 1 spray in the mouth or throat as needed for throat irritation / pain.    QUEtiapine  50 MG tablet Commonly known as: SEROQUEL  Take 50 mg by mouth at bedtime.    senna 8.6 MG  Tabs tablet Commonly known as: SENOKOT Take 1 tablet (8.6 mg total) by mouth 2 (two) times daily as needed for mild constipation.    spironolactone  25 MG tablet Commonly known as: ALDACTONE  Take 0.5 tablets (12.5 mg total) by mouth daily.    vancomycin  125 MG capsule Commonly known as: VANCOCIN  Take 1 capsule (125 mg total) by mouth 4 (four) times daily for 8 days.    Ventolin  HFA 108 (90 Base) MCG/ACT inhaler Generic drug: albuterol  Inhale 2 puffs into the lungs every 4 (four) hours as needed for wheezing.    albuterol  (2.5 MG/3ML) 0.083% nebulizer solution Commonly known as: PROVENTIL  Take 2.5 mg by nebulization every 4 (four) hours as needed for wheezing or shortness of breath.    Xifaxan  550 MG Tabs tablet Generic drug: rifaximin  Take 1 tablet (550 mg total) by mouth 2 (two) times daily. Start AFTER finishing vancomycin  What changed: additional instructions  Relevant Imaging Results:  Relevant Lab Results:   Additional Information SS# [id number]  [name], RN     "

## 2024-08-18 NOTE — Discharge Summary (Signed)
 [name] FMW:[id number] DOB: May 13, 1951 DOA: 08/16/2024  PCP: [facility]  Admit date: 08/16/2024 Discharge date: 08/18/2024  Time spent: 35 minutes  Recommendations for Outpatient Follow-up:  Pcp f/u Urology f/u     Discharge Diagnoses:  Principal Problem:   C. difficile colitis Active Problems:   Hepatic encephalopathy (HCC)   Diabetes mellitus type 2, noninsulin dependent (HCC)   Essential hypertension   CAD (coronary artery disease)   HTN (hypertension)   Weakness   GI bleeding   Discharge Condition: stable  Diet recommendation: low sodium heart healthy  Filed Weights   08/16/24 0740  Weight: 93.7 kg    History of present illness:  From admission h and p [name] is a pleasant 73 y.o. male with medical history significant for recurrent hepatic encephalopathy, COPD, CHF, HTN, DM who was recently admitted and discharged after being treated for hepatic encephalopathy between 07/31/2024 and 08/06/2024, came in to the hospital complaining of generalized weakness and diarrhea for the last 2 days.  Patient stated that he had 7 or 8 episodes of diarrhea yesterday without any fever or chills.  He also had some episodes of nausea and -2 episodes  of vomiting.  Patient has a history of hepatic encephalopathy but he was able to provide meaningful history.  He complains some abdominal discomfort but he says this is not bad.  He denies any cough, shortness of breath, palpitations.  He denies any blood in the stool or hematemesis.  He stated that due to multiple episodes of diarrhea he is feeling weak to the point that he is not able to ambulate.   Hospital Course:   # Diarrhea C diff positive, patient has had multiple hospitalizations and recently received IV abx for pyuria. So we will treat. But patient also on lactulose  and it is possible the diarrhea is secondary to lactulose . Appears to be first instance of c diff. Diarrhea resolved. Abdomen benign.  Nonsevere. - continue oral vanc to complete 10 day course   # Cirrhosis With history hepatic encephalopathy. 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 At baseline - monitor   # HFpEF Appears euvolemic, kidney function at baseline   # MDD - home celexa , seroquel   Procedures: none   Consultations: none  Discharge Exam: Vitals:   08/18/24 0337 08/18/24 0829  BP: (!) 123/58 138/78  Pulse: (!) 51 (!) 48  Resp:    Temp: 98.4 F (36.9 C) 98.2 F (36.8 C)  SpO2: 100% 99%    General: NAD Cardiovascular: RRR soft systolic murmur Respiratory: CTAB Abdomen: soft, obese, non-tender  Discharge Instructions   Discharge Instructions     Increase activity slowly   Complete by: As directed       Allergies as of 08/18/2024       Reactions   Bee Venom Anaphylaxis   Codeine  Itching   Only itching. Recently allergy tested at [facility]   Rsv Mrna Pre-f Virus Vaccine Swelling   Meloxicam    Other Reaction(s): ?overdose of local given at dentist--had  trouble breathing   Novocain [procaine]    Sulfa Antibiotics    hallucinations        Medication List     TAKE these medications    acetaminophen  650 MG CR tablet Commonly known as: TYLENOL  Take 650 mg by mouth every 8 (eight) hours as needed for pain.   aspirin  81 MG chewable tablet Chew 81 mg by mouth daily.   atorvastatin  40 MG tablet Commonly known as: LIPITOR Take 40 mg by mouth daily.   benzocaine  10 % mucosal gel Commonly known as: ORAJEL Apply to the  tip of the penis for penile pain up to four times daily prn for penile pain   citalopram  20 MG tablet Commonly known as: CELEXA  Take 20 mg by mouth daily.   feeding supplement Liqd Take 237 mLs by mouth 3 (three) times daily between meals.   furosemide  40 MG tablet Commonly known as: LASIX  Take 40 mg by mouth daily.   gabapentin  300 MG capsule Commonly known as: NEURONTIN  Take 300 mg by mouth 2 (two) times daily.   lactulose  10 GM/15ML solution Commonly known as: CHRONULAC  Take 45 mLs (30 g total) by mouth 3 (three) times daily. Please titrate to have 2-3 soft bowel movements daily What changed: how much to take   methocarbamol  500 MG tablet Commonly known as: ROBAXIN  Take 1 tablet (500 mg total) by mouth every 8 (eight) hours as needed for muscle spasms.   multivitamin with minerals Tabs tablet Take 1 tablet by mouth daily.   nitroGLYCERIN  0.4 MG/SPRAY spray Commonly known as: NITROLINGUAL  Place 1 spray under the tongue every 5 (five) minutes x 3 doses as needed for chest pain.   nystatin  powder Apply 1 Application topically 3 (three) times daily.   ondansetron  4 MG tablet Commonly known as: ZOFRAN  Take 1 tablet (4 mg total) by mouth every 6 (six) hours as needed for nausea.   pantoprazole  40 MG tablet Commonly known as: PROTONIX  Take 40 mg by mouth daily.   phenol 1.4 % Liqd Commonly known as: CHLORASEPTIC Use as directed 1 spray in the mouth or throat as needed for throat irritation / pain.   QUEtiapine  50 MG tablet Commonly known as: SEROQUEL  Take 50 mg by mouth at bedtime.   senna 8.6 MG Tabs tablet Commonly known as: SENOKOT Take 1 tablet (8.6 mg total) by mouth 2 (two) times daily as needed for mild constipation.   spironolactone  25 MG tablet Commonly known as: ALDACTONE  Take 0.5 tablets (12.5 mg total) by mouth daily.   vancomycin  125 MG capsule Commonly known as: VANCOCIN  Take 1 capsule (125 mg total) by mouth 4 (four) times daily for 8 days.    Ventolin  HFA 108 (90 Base) MCG/ACT inhaler Generic drug: albuterol  Inhale 2 puffs into the lungs every 4 (four) hours as needed for wheezing.   albuterol  (2.5 MG/3ML) 0.083% nebulizer solution Commonly known as: PROVENTIL  Take 2.5 mg by nebulization every 4 (four) hours as needed for wheezing or shortness of breath.   Xifaxan  550 MG Tabs tablet Generic drug: rifaximin  Take 1 tablet (550 mg total) by mouth 2 (two) times daily. Start AFTER finishing vancomycin  What changed: additional instructions       Allergies[1]  Follow-up Information     [facility] Follow up.   Contact information: [address] [place] [address] [phone]         Twylla Glendia BROCKS, MD Follow up.   Specialty: Urology Contact information: [address] [place] [address] [phone]                  The results of significant diagnostics from this hospitalization (including imaging, microbiology, ancillary and laboratory) are listed below for reference.    Significant Diagnostic Studies: DG Chest Portable 1 View Result Date: 07/31/2024 CLINICAL DATA:  verify tube placement EXAM: PORTABLE CHEST - 1 VIEW COMPARISON:  July 31, 2024 FINDINGS: Interval placement of an esophagogastric tube, which terminates in the stomach. Endotracheal tube has been placed and terminates in the mid trachea. Low lung volumes. No focal airspace consolidation, pleural effusion, or pneumothorax. Mild cardiomegaly. Tortuous aorta with aortic atherosclerosis. Multilevel thoracic osteophytosis. Right upper quadrant surgical clips. IMPRESSION: 1. Well-positioned endotracheal and esophagogastric tubes. No pneumothorax. 2. Low lung volumes, without significant interval change to the lungs. Electronically Signed   By: Rogelia Myers M.D.   On: 07/31/2024 10:42   CT ABDOMEN PELVIS WO CONTRAST Result Date: 07/31/2024 EXAM: CT ABDOMEN AND PELVIS WITHOUT CONTRAST 07/31/2024 09:28:22  AM TECHNIQUE: CT of the abdomen and pelvis was performed without the administration of intravenous contrast. Multiplanar reformatted images are provided for review. Automated exposure control, iterative reconstruction, and/or weight-based adjustment of the mA/kV was utilized to reduce the radiation dose to as low as reasonably achievable. COMPARISON: CT abdomen and pelvis April 26, 2024. CLINICAL HISTORY: 73 year old male. Abdominal pain, acute, nonlocalized. FINDINGS: LOWER CHEST: No pericardial or pleural effusion. Lower lung volumes, mild respiratory motion and increased patchy bibasilar opacity which most resembles atelectasis. LIVER: Nodular, cirrhotic liver. GALLBLADDER AND BILE DUCTS: Chronic cholecystectomy. Chronic bile duct dilatation is stable. SPLEEN: Stable mild splenomegaly. PANCREAS: No acute abnormality. ADRENAL GLANDS: No acute abnormality. KIDNEYS, URETERS AND BLADDER: Chronic asymmetric renal atrophy and nephrolithiasis. Foley catheter in place, positioning appears appropriate and the urinary bladder is decompressed. Underlying bladder wall thickening, and mild inflammatory stranding along the bladder wall (series 7 image 79) although less pronounced compared to 04/26/24. Suprapubic catheter has been removed since 04-26-2024. No hydronephrosis. No perinephric or periureteral stranding. GI AND BOWEL: Small volume retained fluid in the stomach. Decompressed duodenum. Cecum is on a lax mesentery located partially in the right upper quadrant. Appendix not identified. Fluid throughout nondilated large and small bowel loops suspicious for acute enteritis / diarrhea. Layering fluid level in the rectum. No focal bowel inflammation identified. There is no bowel obstruction. PERITONEUM AND RETROPERITONEUM: No ascites. No free air. VASCULATURE: Aorta is normal in caliber. Mild for age aortoiliac calcified atherosclerosis. LYMPH NODES: No lymphadenopathy. REPRODUCTIVE ORGANS: No acute abnormality. BONES AND  SOFT TISSUES: Chronic spine degeneration, especially lumbar facet arthropathy. Stable postoperative chronic left lateral ventral abdominal wall hernia now contains only mesentery on series 7 image 47, plus a small volume of free fluid within the hernia sac (series 7 image 49). No regional hernia. No herniated bowel today. No acute osseous abnormality. No focal soft tissue abnormality. IMPRESSION: 1. Fluid containing nondilated small and large bowel suggesting acute enteritis/diarrhea. 2. Foley catheter with no adverse features. Bladder wall thickening and mild inflammation, but decreased from 26-Apr-2024. 3. Cirrhosis with mild splenomegaly. Chronic renal atrophy and nephrolithiasis. 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 Lung base atelectasis. Electronically signed by: Helayne Hurst MD 07/31/2024 09:56 AM EST RP Workstation: [id number]   CT Head Wo Contrast Result Date: 07/31/2024 EXAM: CT HEAD WITHOUT CONTRAST 07/31/2024 09:28:22 AM TECHNIQUE: CT of the head was performed without the administration of intravenous contrast. Automated exposure control, iterative reconstruction, and/or weight  based adjustment of the mA/kV was utilized to reduce the radiation dose to as low as reasonably achievable. COMPARISON: Head CT 07/18/2024, Brain MRI 09/22/2022. CLINICAL HISTORY: 73 year old male found unresponsive. FINDINGS: BRAIN AND VENTRICLES: No acute hemorrhage. No evidence of acute infarct. No hydrocephalus. No extra-axial collection. No mass effect or midline shift. Brain volume remains normal for age. Gray white differentiation stable and normal for age. No suspicious intracranial vascular hyperdensity. Mild calcified atherosclerosis at the skull base. ORBITS: No acute abnormality. Stable small chronic metallic cerclage wire along the right superior orbital rim. SINUSES: Paranasal sinuses remain well aerated. SOFT TISSUES AND SKULL: No acute soft tissue abnormality. No skull  fracture. Tympanic cavities and mastoid air cells remain well aerated. IMPRESSION: 1. No acute traumatic injury identified. 2. Stable and normal for age non-contrast CT appearance of the brain. Electronically signed by: Helayne Hurst MD 07/31/2024 09:36 AM EST RP Workstation: [id number]   DG Chest Port 1 View Result Date: 07/31/2024 CLINICAL DATA:  Questionable sepsis - evaluate for abnormality EXAM: PORTABLE CHEST - 1 VIEW COMPARISON:  06/26/2024 FINDINGS: No focal airspace consolidation, pleural effusion, or pneumothorax. Mild cardiomegaly. Tortuous aorta with aortic atherosclerosis. No acute fracture or destructive lesions. Multilevel thoracic osteophytosis. Cervical fusion hardware. Right upper quadrant surgical clips. IMPRESSION: Mild cardiomegaly. No pneumonia or pulmonary edema. Electronically Signed   By: Rogelia Myers M.D.   On: 07/31/2024 09:05    Microbiology: Recent Results (from the past 240 hours)  C Difficile Quick Screen w PCR reflex     Status: Abnormal   Collection Time: 08/16/24  9:20 AM   Specimen: STOOL  Result Value Ref Range Status   C Diff antigen POSITIVE (A) NEGATIVE Final   C Diff toxin POSITIVE (A) NEGATIVE Final   C Diff interpretation Toxin producing C. difficile detected.  Final    Comment: CRITICAL RESULT CALLED TO, READ BACK BY AND VERIFIED WITH: HECTOR GARCIA ON 08/16/24 AT 1115 QSD Performed at [facility], [address] [place], [place] [address]   Resp panel by RT-PCR (RSV, Flu A&B, Covid) Anterior Nasal Swab     Status: None   Collection Time: 08/16/24  9:20 AM   Specimen: Anterior Nasal Swab  Result Value Ref Range Status   SARS Coronavirus 2 by RT PCR NEGATIVE NEGATIVE Final    Comment: (NOTE) SARS-CoV-2 target nucleic acids are NOT DETECTED.  The SARS-CoV-2 RNA is generally detectable in upper respiratory specimens during the acute phase of infection. The lowest concentration of SARS-CoV-2 viral copies this assay can detect is 138  copies/mL. A negative result does not preclude SARS-Cov-2 infection and should not be used as the sole basis for treatment or other patient management decisions. A negative result may occur with  improper specimen collection/handling, submission of specimen other than nasopharyngeal swab, presence of viral mutation(s) within the areas targeted by this assay, and inadequate number of viral copies(<138 copies/mL). A negative result must be combined with clinical observations, patient history, and epidemiological information. The expected result is Negative.  Fact Sheet for Patients:  bloggercourse.com  Fact Sheet for Healthcare Providers:  seriousbroker.it  This test is no t yet approved or cleared by the [place]  FDA and  has been authorized for detection and/or diagnosis of SARS-CoV-2 by FDA under an Emergency Use Authorization (EUA). This EUA will remain  in effect (meaning this test can be used) for the duration of the COVID-19 declaration under Section 564(b)(1) of the Act, 21 U.S.C.section 360bbb-3(b)(1), unless the authorization is terminated  or revoked sooner.       Influenza A by PCR NEGATIVE NEGATIVE Final   Influenza B by PCR NEGATIVE NEGATIVE Final    Comment: (NOTE) The Xpert Xpress SARS-CoV-2/FLU/RSV plus assay is intended as an aid in the diagnosis of influenza from Nasopharyngeal swab specimens and should not be used as a sole basis for treatment. Nasal washings and aspirates are unacceptable for Xpert Xpress SARS-CoV-2/FLU/RSV testing.  Fact Sheet for Patients: bloggercourse.com  Fact Sheet for Healthcare Providers: seriousbroker.it  This test is not yet approved or cleared by the [place]  FDA and has been authorized for detection and/or diagnosis of SARS-CoV-2 by FDA under an Emergency Use Authorization (EUA). This EUA will remain in effect (meaning  this test can be used) for the duration of the COVID-19 declaration under Section 564(b)(1) of the Act, 21 U.S.C. section 360bbb-3(b)(1), unless the authorization is terminated or revoked.     Resp Syncytial Virus by PCR NEGATIVE NEGATIVE Final    Comment: (NOTE) Fact Sheet for Patients: bloggercourse.com  Fact Sheet for Healthcare Providers: seriousbroker.it  This test is not yet approved or cleared by the [place]  FDA and has been authorized for detection and/or diagnosis of SARS-CoV-2 by FDA under an Emergency Use Authorization (EUA). This EUA will remain in effect (meaning this test can be used) for the duration of the COVID-19 declaration under Section 564(b)(1) of the Act, 21 U.S.C. section 360bbb-3(b)(1), unless the authorization is terminated or revoked.  Performed at [facility], [address] [place], [place] [address]   Gastrointestinal Panel by PCR , Stool     Status: None   Collection Time: 08/16/24  9:20 AM  Result Value Ref Range Status   Campylobacter species NOT DETECTED NOT DETECTED Final   Plesimonas shigelloides NOT DETECTED NOT DETECTED Final   Salmonella species NOT DETECTED NOT DETECTED Final   Yersinia enterocolitica NOT DETECTED NOT DETECTED Final   Vibrio species NOT DETECTED NOT DETECTED Final   Vibrio cholerae NOT DETECTED NOT DETECTED Final   Enteroaggregative E coli (EAEC) NOT DETECTED NOT DETECTED Final   Enteropathogenic E coli (EPEC) NOT DETECTED NOT DETECTED Final   Enterotoxigenic E coli (ETEC) NOT DETECTED NOT DETECTED Final   Shiga like toxin producing E coli (STEC) NOT DETECTED NOT DETECTED Final   Shigella/Enteroinvasive E coli (EIEC) NOT DETECTED NOT DETECTED Final   Cryptosporidium NOT DETECTED NOT DETECTED Final   Cyclospora cayetanensis NOT DETECTED NOT DETECTED Final   Entamoeba histolytica NOT DETECTED NOT DETECTED Final   Giardia lamblia NOT DETECTED NOT DETECTED  Final   Adenovirus F40/41 NOT DETECTED NOT DETECTED Final   Astrovirus NOT DETECTED NOT DETECTED Final   Norovirus GI/GII NOT DETECTED NOT DETECTED Final   Rotavirus A NOT DETECTED NOT DETECTED Final   Sapovirus (I, II, IV, and V) NOT DETECTED NOT DETECTED Final    Comment: Performed at [facility], [address] [place], [place] [address]     Labs: Basic Metabolic Panel: Recent Labs  Lab 08/16/24 0742 08/16/24 1340 08/17/24 0503 08/18/24 0505  NA 140 143 139 140  K 5.4* 4.7 4.4 4.7  CL 113* 117* 115* 115*  CO2 20* 20* 18* 17*  GLUCOSE 87 74 68* 96  BUN 23 22 23 21   CREATININE 1.63* 1.51* 1.64* 1.75*  CALCIUM  8.8* 8.7* 8.2* 8.4*   Liver Function Tests: Recent Labs  Lab 08/16/24 0742 08/17/24 0503  AST 48* 28  ALT 20 14  ALKPHOS 99 69  BILITOT 0.7  0.7  PROT 6.9 6.0*  ALBUMIN  2.8* 2.4*   No results for input(s): LIPASE, AMYLASE in the last 168 hours. Recent Labs  Lab 08/16/24 0920 08/17/24 0503  AMMONIA 243* 52*   CBC: Recent Labs  Lab 08/16/24 0742 08/16/24 1340 08/17/24 0503 08/18/24 0505  WBC 3.4* 2.8* 3.0* 3.0*  HGB 8.6* 8.8* 8.0* 8.0*  HCT 27.1* 27.7* 24.7* 25.0*  MCV 95.1 95.5 93.9 95.8  PLT 103* 110* 105* 88*   Cardiac Enzymes: No results for input(s): CKTOTAL, CKMB, CKMBINDEX, TROPONINI in the last 168 hours. BNP: BNP (last 3 results) Recent Labs    08/22/23 1150 10/21/23 1842 12/30/23 1631  BNP 252.4* 250.6* 381.2*    ProBNP (last 3 results) Recent Labs    08/01/24 0204  PROBNP 655.0*    CBG: Recent Labs  Lab 08/17/24 0927 08/17/24 1144 08/17/24 1645 08/18/24 0821 08/18/24 0900  GLUCAP 57* 153* 113* 62* 85       Signed:  Devaughn KATHEE Ban MD.  [facility] 08/18/2024, 10:20 AM     [1]  Allergies Allergen Reactions   Bee Venom Anaphylaxis   Codeine  Itching    Only itching. Recently allergy tested at [facility] Pre-F Virus Vaccine Swelling   Meloxicam     Other Reaction(s):  ?overdose of local given at dentist--had trouble breathing   Novocain [Procaine]    Sulfa Antibiotics     hallucinations

## 2024-08-24 ENCOUNTER — Emergency Department

## 2024-08-24 ENCOUNTER — Emergency Department: Admission: EM | Admit: 2024-08-24 | Discharge: 2024-08-24 | Disposition: A | Discharge status: Home / Self Care

## 2024-08-24 ENCOUNTER — Other Ambulatory Visit: Payer: Self-pay

## 2024-08-24 ENCOUNTER — Encounter: Payer: Self-pay | Admitting: Emergency Medicine

## 2024-08-24 DIAGNOSIS — I110 Hypertensive heart disease with heart failure: Secondary | ICD-10-CM | POA: Diagnosis not present

## 2024-08-24 DIAGNOSIS — E119 Type 2 diabetes mellitus without complications: Secondary | ICD-10-CM | POA: Insufficient documentation

## 2024-08-24 DIAGNOSIS — Z751 Person awaiting admission to adequate facility elsewhere: Secondary | ICD-10-CM | POA: Diagnosis not present

## 2024-08-24 DIAGNOSIS — I509 Heart failure, unspecified: Secondary | ICD-10-CM | POA: Diagnosis not present

## 2024-08-24 DIAGNOSIS — Y732 Prosthetic and other implants, materials and accessory gastroenterology and urology devices associated with adverse incidents: Secondary | ICD-10-CM | POA: Insufficient documentation

## 2024-08-24 DIAGNOSIS — Z978 Presence of other specified devices: Secondary | ICD-10-CM

## 2024-08-24 DIAGNOSIS — T83511A Infection and inflammatory reaction due to indwelling urethral catheter, initial encounter: Secondary | ICD-10-CM | POA: Diagnosis not present

## 2024-08-24 DIAGNOSIS — J449 Chronic obstructive pulmonary disease, unspecified: Secondary | ICD-10-CM | POA: Diagnosis not present

## 2024-08-24 DIAGNOSIS — K625 Hemorrhage of anus and rectum: Secondary | ICD-10-CM | POA: Diagnosis not present

## 2024-08-24 DIAGNOSIS — Z7689 Persons encountering health services in other specified circumstances: Secondary | ICD-10-CM

## 2024-08-24 DIAGNOSIS — R197 Diarrhea, unspecified: Secondary | ICD-10-CM | POA: Diagnosis present

## 2024-08-24 LAB — URINALYSIS, W/ REFLEX TO CULTURE (INFECTION SUSPECTED)
Bilirubin Urine: NEGATIVE
Glucose, UA: NEGATIVE mg/dL
Ketones, ur: NEGATIVE mg/dL
Nitrite: NEGATIVE
Protein, ur: NEGATIVE mg/dL
Specific Gravity, Urine: 1.011 (ref 1.005–1.030)
Squamous Epithelial / HPF: 0 /HPF (ref 0–5)
WBC, UA: >50 WBC/hpf (ref 0–5)
pH: 5 (ref 5.0–8.0)

## 2024-08-24 LAB — COMPREHENSIVE METABOLIC PANEL WITH GFR
ALT: 14 U/L (ref 0–44)
AST: 34 U/L (ref 15–41)
Albumin: 2.8 g/dL — ABNORMAL LOW (ref 3.5–5.0)
Alkaline Phosphatase: 74 U/L (ref 38–126)
Anion gap: 8 (ref 5–15)
BUN: 20 mg/dL (ref 8–23)
CO2: 21 mmol/L — ABNORMAL LOW (ref 22–32)
Calcium: 9.6 mg/dL (ref 8.9–10.3)
Chloride: 113 mmol/L — ABNORMAL HIGH (ref 98–111)
Creatinine, Ser: 1.27 mg/dL — ABNORMAL HIGH (ref 0.61–1.24)
GFR, Estimated: 59 mL/min — ABNORMAL LOW
Glucose, Bld: 54 mg/dL — ABNORMAL LOW (ref 70–99)
Potassium: 4.1 mmol/L (ref 3.5–5.1)
Sodium: 141 mmol/L (ref 135–145)
Total Bilirubin: 1.2 mg/dL (ref 0.0–1.2)
Total Protein: 6.7 g/dL (ref 6.5–8.1)

## 2024-08-24 LAB — CBC WITH DIFFERENTIAL/PLATELET
Abs Immature Granulocytes: 0.02 K/uL (ref 0.00–0.07)
Basophils Absolute: 0 K/uL (ref 0.0–0.1)
Basophils Relative: 1 %
Eosinophils Absolute: 0.2 K/uL (ref 0.0–0.5)
Eosinophils Relative: 6 %
HCT: 25.7 % — ABNORMAL LOW (ref 39.0–52.0)
Hemoglobin: 8.1 g/dL — ABNORMAL LOW (ref 13.0–17.0)
Immature Granulocytes: 1 %
Lymphocytes Relative: 22 %
Lymphs Abs: 0.7 K/uL (ref 0.7–4.0)
MCH: 30.3 pg (ref 26.0–34.0)
MCHC: 31.5 g/dL (ref 30.0–36.0)
MCV: 96.3 fL (ref 80.0–100.0)
Monocytes Absolute: 0.3 K/uL (ref 0.1–1.0)
Monocytes Relative: 11 %
Neutro Abs: 1.7 K/uL (ref 1.7–7.7)
Neutrophils Relative %: 59 %
Platelets: 99 K/uL — ABNORMAL LOW (ref 150–400)
RBC: 2.67 MIL/uL — ABNORMAL LOW (ref 4.22–5.81)
RDW: 17.1 % — ABNORMAL HIGH (ref 11.5–15.5)
WBC: 2.9 K/uL — ABNORMAL LOW (ref 4.0–10.5)
nRBC: 0 % (ref 0.0–0.2)

## 2024-08-24 MED ORDER — IOHEXOL 300 MG/ML  SOLN
100.0000 mL | Freq: Once | INTRAMUSCULAR | Status: AC | PRN
  Administered 2024-08-24: 100 mL via INTRAVENOUS

## 2024-08-24 NOTE — ED Provider Notes (Signed)
 "  [facility] Provider Note    Event Date/Time   First MD Initiated Contact with Patient 08/24/24 [phone]     (approximate)   History   Catheter Issue    HPI  [name] is a 73 y.o. male with medical history significant for recurrent hepatic encephalopathy, COPD, CHF, HTN, DM recent hospitalization from 26 21-12 23 for C. difficile colitis in the setting of being treated for multiple UTIs who presents from his skilled nursing facility with concern for bright red blood found in his brief.  Patient is unsure whether the blood is coming from his penis or his rectum.  Reports that he has diarrhea at baseline secondary to lactulose .      Physical Exam   Triage Vital Signs: ED Triage Vitals  Encounter Vitals Group     BP 08/24/24 0629 (!) 153/67     Girls Systolic BP Percentile --      Girls Diastolic BP Percentile --      Boys Systolic BP Percentile --      Boys Diastolic BP Percentile --      Pulse Rate 08/24/24 0629 64     Resp 08/24/24 0629 18     Temp 08/24/24 0629 98.6 F (37 C)     Temp Source 08/24/24 0629 Oral     SpO2 08/24/24 0629 98 %     Weight 08/24/24 0630 205 lb 0.4 oz (93 kg)     Height 08/24/24 0630 5' 6 (1.676 m)     Head Circumference --      Peak Flow --      Pain Score 08/24/24 0630 5     Pain Loc --      Pain Education --      Exclude from Growth Chart --     Most recent vital signs: Vitals:   08/24/24 0629 08/24/24 1230  BP: (!) 153/67 (!) 149/71  Pulse: 64 71  Resp: 18 20  Temp: 98.6 F (37 C) 98 F (36.7 C)  SpO2: 98% 99%    Nursing Triage Note reviewed. Vital signs reviewed and patients oxygen saturation is normoxic  General: Patient is well nourished, well developed, awake and alert, resting comfortably in no acute distress Head: Normocephalic and atraumatic Eyes: Normal inspection, extraocular muscles intact, no conjunctival pallor Ear, nose, throat: Normal external exam Neck: Normal range of  motion Respiratory: Patient is in no respiratory distress, lungs CTAB Cardiovascular: Patient is not tachycardic, RRR without murmur appreciated GI: Abd SNT with no guarding or rebound  Back: Normal inspection of the back with good strength and range of motion throughout all ext Extremities: pulses intact with good cap refills, no LE pitting edema or calf tenderness Neuro: The patient is alert and oriented to person, place, and time, appropriately conversive, with 5/5 bilat UE/LE strength, no gross motor or sensory defects noted. Coordination appears to be adequate. Skin: Warm, dry, and intact Psych: normal mood and affect, no SI or HI  GU: Completed with nurse technician Darrell in the room No external hemorrhoids, no gross blood on rectal exam, Foley in place missing lower part of penis shaft but no surrounding erythema or bright red blood  ED Results / Procedures / Treatments   Labs (all labs ordered are listed, but only abnormal results are displayed) Labs Reviewed  COMPREHENSIVE METABOLIC PANEL WITH GFR - Abnormal; Notable for the following components:      Result Value   Chloride 113 (*)  CO2 21 (*)    Glucose, Bld 54 (*)    Creatinine, Ser 1.27 (*)    Albumin  2.8 (*)    GFR, Estimated 59 (*)    All other components within normal limits  CBC WITH DIFFERENTIAL/PLATELET - Abnormal; Notable for the following components:   WBC 2.9 (*)    RBC 2.67 (*)    Hemoglobin 8.1 (*)    HCT 25.7 (*)    RDW 17.1 (*)    Platelets 99 (*)    All other components within normal limits  URINALYSIS, W/ REFLEX TO CULTURE (INFECTION SUSPECTED) - Abnormal; Notable for the following components:   Color, Urine YELLOW (*)    APPearance CLOUDY (*)    Hgb urine dipstick MODERATE (*)    Leukocytes,Ua LARGE (*)    Bacteria, UA MANY (*)    All other components within normal limits  URINE CULTURE     EKG None  RADIOLOGY No evidence of colitis on my independent review interpretation radiologist  agrees but also states that the Foley balloon is in the prostatic urethra    PROCEDURES:  Critical Care performed: No  Procedures   MEDICATIONS ORDERED IN ED: Medications  iohexol  (OMNIPAQUE ) 300 MG/ML solution 100 mL (100 mLs Intravenous Contrast Given 08/24/24 1241)     IMPRESSION / MDM / ASSESSMENT AND PLAN / ED COURSE                                Differential diagnosis includes, but is not limited to, colitis, UTI, nephrolithiasis, acute anemia, electrolyte derangement  ED course: Patient is well-appearing and reassured that he has no leukocytosis and his blood counts are at his baseline.  Urinalysis was obtained from his Foley and seems unchanged from previous with no negative nitrates.  He had no profound electrolyte derangements.  Given his recent history of C. difficile colitis, CT abdomen pelvis was obtained which demonstrated no evidence of colitis, diverticulitis but did demonstrate the Foley in the prostatic urethra.  Nursing will replace this with a new Foley.  Plan for discharge shortly thereafterwards.  Patient in agreement with plan   Clinical Course as of 08/24/24 1521  Mon Aug 24, 2024  0811 Creatinine(!): 1.27 [HD]  0811 CBC with Differential(!) At baseline for patient [HD]  1020 WBC(!): 2.9 No significant leukocytosis [HD]  1046 Urinalysis, w/ Reflex to Culture (Infection Suspected) -Urine, Clean Catch(!) Unchanged from 8 days ago [HD]  1132 Unfortunately forced IV with CT attempt infiltrated.  Nurse aware and will place a new IV [HD]  1210 CT notified again of new IV they state that they will grab them next [HD]  1419 CT ABDOMEN PELVIS W CONTRAST No evidence of colitis will discharge [HD]  1421 Repeat abdominal exam remains benign patient tolerating p.o.  Feels comfortable returning back to his facility [HD]  1426 Of note on the CT scan the Foley is in the prostate urethra.  Will plan to change this out and discharge [HD]    Clinical Course User  Index [HD] Nicholaus Rolland BRAVO, MD   At time of discharge there is no evidence of acute life, limb, vision, or fertility threat. Patient has stable vital signs, pain is well controlled and p.o. tolerant.  Discharge instructions were completed using the EPIC system. I would refer you to those at this time. All warnings prescriptions follow-up etc. were discussed in detail with the patient. Patient indicates understanding and is  agreeable with this plan. All questions answered.  Patient is made aware that they may return to the emergency department for any worsening or new condition or for any other emergency.  -- Risk: 5 This patient has a high risk of morbidity due to further diagnostic testing or treatment. Rationale: This patients evaluation and management involve a high risk of morbidity due to the potential severity of presenting symptoms, need for diagnostic testing, and/or initiation of treatment that may require close monitoring. The differential includes conditions with potential for significant deterioration or requiring escalation of care. Treatment decisions in the ED, including medication administration, procedural interventions, or disposition planning, reflect this level of risk. COPA: 5 The patient has the following acute or chronic illness/injury that poses a possible threat to life or bodily function: [X] : The patient has a potentially serious acute condition or an acute exacerbation of a chronic illness requiring urgent evaluation and management in the Emergency Department. The clinical presentation necessitates immediate consideration of life-threatening or function-threatening diagnoses, even if they are ultimately ruled out.   FINAL CLINICAL IMPRESSION(S) / ED DIAGNOSES   Final diagnoses:  BRBPR (bright red blood per rectum)  Chronic indwelling Foley catheter  Frequent patient in emergency department     Rx / DC Orders   ED Discharge Orders     None        Note:   This document was prepared using Dragon voice recognition software and may include unintentional dictation errors.   Nicholaus Rolland BRAVO, MD 08/24/24 1521  "

## 2024-08-24 NOTE — Discharge Instructions (Signed)
 You were seen in the emergency department for possible blood in your briefs.  Your hemoglobin is stable and CT imaging revealed no acute abnormality.  He did not have signs of infection.  At this time you are stable to return back to skilled nursing facility and continue your regular medications.  Please follow-up with your primary care physician as soon as possible -- RETURN PRECAUTIONS & AFTERCARE: (ENGLISH) RETURN PRECAUTIONS: Return immediately to the emergency department or see/call your doctor if you feel worse, weak or have changes in speech or vision, are short of breath, have fever, vomiting, pain, bleeding or dark stool, trouble urinating or any new issues. Return here or see/call your doctor if not improving as expected for your suspected condition. FOLLOW-UP CARE: Call your doctor and/or any doctors we referred you to for more advice and to make an appointment. Do this today, tomorrow or after the weekend. Some doctors only take PPO insurance so if you have HMO insurance you may want to contact your HMO or your regular doctor for referral to a specialist within your plan. Either way tell the doctor's office that it was a referral from the emergency department so you get the soonest possible appointment.  YOUR TEST RESULTS: Take result reports of any blood or urine tests, imaging tests and EKG's to your doctor and any referral doctor. Have any abnormal tests repeated. Your doctor or a referral doctor can let you know when this should be done. Also make sure your doctor contacts this hospital to get any test results that are not currently available such as cultures or special tests for infection and final imaging reports, which are often not available at the time you leave the ER but which may list additional important findings that are not documented on the preliminary report. BLOOD PRESSURE: If your blood pressure was greater than 120/80 have your blood pressure rechecked within 1 to 2  weeks. MEDICATION SIDE EFFECTS: Do not drive, walk, bike, take the bus, etc. if you have received or are being prescribed any sedating medications such as those for pain or anxiety or certain antihistamines like Benadryl . If you have been give one of these here get a taxi home or have a friend drive you home. Ask your pharmacist to counsel you on potential side effects of any new medication

## 2024-08-24 NOTE — ED Notes (Signed)
 Pt to ED via EMS from Group home in [place], pt reports he was having BM and it became painful and he noted blood in his stool. VSS.

## 2024-08-24 NOTE — ED Triage Notes (Signed)
 Patient from [facility] with complaints of bleeding that was noticed while using the bathroom. Patient is not sure if it is coming from his foley catheter of coming out of his rectum. Complains of pain around where the foley is. Foley is draining normally per the patient. However reports burning with urination.

## 2024-08-27 LAB — URINE CULTURE: Culture: >=100000 — AB

## 2024-09-13 ENCOUNTER — Encounter: Payer: Self-pay | Admitting: Emergency Medicine

## 2024-09-13 ENCOUNTER — Other Ambulatory Visit: Payer: Self-pay

## 2024-09-13 ENCOUNTER — Emergency Department
Admission: EM | Admit: 2024-09-13 | Discharge: 2024-09-13 | Disposition: A | Discharge status: Home / Self Care | Attending: Emergency Medicine | Admitting: Emergency Medicine

## 2024-09-13 DIAGNOSIS — N368 Other specified disorders of urethra: Secondary | ICD-10-CM

## 2024-09-13 DIAGNOSIS — E119 Type 2 diabetes mellitus without complications: Secondary | ICD-10-CM | POA: Diagnosis not present

## 2024-09-13 DIAGNOSIS — I110 Hypertensive heart disease with heart failure: Secondary | ICD-10-CM | POA: Diagnosis not present

## 2024-09-13 DIAGNOSIS — I509 Heart failure, unspecified: Secondary | ICD-10-CM | POA: Insufficient documentation

## 2024-09-13 DIAGNOSIS — J449 Chronic obstructive pulmonary disease, unspecified: Secondary | ICD-10-CM | POA: Insufficient documentation

## 2024-09-13 DIAGNOSIS — T83091A Other mechanical complication of indwelling urethral catheter, initial encounter: Secondary | ICD-10-CM | POA: Diagnosis present

## 2024-09-13 DIAGNOSIS — Y732 Prosthetic and other implants, materials and accessory gastroenterology and urology devices associated with adverse incidents: Secondary | ICD-10-CM | POA: Diagnosis not present

## 2024-09-13 LAB — PRO BRAIN NATRIURETIC PEPTIDE: Pro Brain Natriuretic Peptide: 541 pg/mL — ABNORMAL HIGH

## 2024-09-13 LAB — CBC WITH DIFFERENTIAL/PLATELET
Abs Immature Granulocytes: 0.02 K/uL (ref 0.00–0.07)
Basophils Absolute: 0.1 K/uL (ref 0.0–0.1)
Basophils Relative: 2 %
Eosinophils Absolute: 0.2 K/uL (ref 0.0–0.5)
Eosinophils Relative: 6 %
HCT: 27.9 % — ABNORMAL LOW (ref 39.0–52.0)
Hemoglobin: 8.8 g/dL — ABNORMAL LOW (ref 13.0–17.0)
Immature Granulocytes: 1 %
Lymphocytes Relative: 28 %
Lymphs Abs: 0.9 K/uL (ref 0.7–4.0)
MCH: 29.1 pg (ref 26.0–34.0)
MCHC: 31.5 g/dL (ref 30.0–36.0)
MCV: 92.4 fL (ref 80.0–100.0)
Monocytes Absolute: 0.6 K/uL (ref 0.1–1.0)
Monocytes Relative: 17 %
Neutro Abs: 1.5 K/uL — ABNORMAL LOW (ref 1.7–7.7)
Neutrophils Relative %: 46 %
Platelets: 105 K/uL — ABNORMAL LOW (ref 150–400)
RBC: 3.02 MIL/uL — ABNORMAL LOW (ref 4.22–5.81)
RDW: 15.9 % — ABNORMAL HIGH (ref 11.5–15.5)
WBC: 3.3 K/uL — ABNORMAL LOW (ref 4.0–10.5)
nRBC: 0 % (ref 0.0–0.2)

## 2024-09-13 LAB — COMPREHENSIVE METABOLIC PANEL WITH GFR
ALT: 18 U/L (ref 0–44)
AST: 36 U/L (ref 15–41)
Albumin: 2.8 g/dL — ABNORMAL LOW (ref 3.5–5.0)
Alkaline Phosphatase: 87 U/L (ref 38–126)
Anion gap: 6 (ref 5–15)
BUN: 12 mg/dL (ref 8–23)
CO2: 24 mmol/L (ref 22–32)
Calcium: 9.5 mg/dL (ref 8.9–10.3)
Chloride: 111 mmol/L (ref 98–111)
Creatinine, Ser: 1.32 mg/dL — ABNORMAL HIGH (ref 0.61–1.24)
GFR, Estimated: 56 mL/min — ABNORMAL LOW
Glucose, Bld: 105 mg/dL — ABNORMAL HIGH (ref 70–99)
Potassium: 3.8 mmol/L (ref 3.5–5.1)
Sodium: 141 mmol/L (ref 135–145)
Total Bilirubin: 1.1 mg/dL (ref 0.0–1.2)
Total Protein: 6.8 g/dL (ref 6.5–8.1)

## 2024-09-13 LAB — AMMONIA: Ammonia: 132 umol/L — ABNORMAL HIGH (ref 9–35)

## 2024-09-13 MED ORDER — LIDOCAINE HCL URETHRAL/MUCOSAL 2 % EX GEL
1.0000 | Freq: Once | CUTANEOUS | Status: AC
  Administered 2024-09-13: 1 via URETHRAL
  Filled 2024-09-13: qty 10

## 2024-09-13 MED ORDER — LIDOCAINE HCL 2 % EX GEL
1.0000 | Freq: Three times a day (TID) | CUTANEOUS | 0 refills | Status: AC | PRN
  Filled 2024-09-13: qty 30, 10d supply, fill #0

## 2024-09-13 NOTE — ED Provider Notes (Signed)
 "  [facility] Provider Note    Event Date/Time   First MD Initiated Contact with Patient 09/13/24 1245     (approximate)   History   foley catheter problem   HPI  [name] is a 74 y.o. male with history of hepatic encephalopathy, COPD, CHF, hypertension, and diabetes who presents with pain around his Foley catheter site.  The patient states that he has had a Foley catheter in for several months.  He notes that it has split the tip of his penis open and he is having bleeding and pain from that area.  He also reports some swelling around the penis, scrotum, and thighs.  He denies any fever or chills.  He states that the urine is flowing out of the Foley without any problems.  I reviewed the past medical records.  The patient was admitted to the hospitalist service between 12/21 and 12/23 due to C. difficile colitis and hepatic encephalopathy.  He was seen on the ED on 12/29 presenting with hematuria.  His Foley was replaced at that time.   Physical Exam   Triage Vital Signs: ED Triage Vitals  Encounter Vitals Group     BP 09/13/24 1240 (!) 162/64     Girls Systolic BP Percentile --      Girls Diastolic BP Percentile --      Boys Systolic BP Percentile --      Boys Diastolic BP Percentile --      Pulse Rate 09/13/24 1240 (!) 54     Resp 09/13/24 1240 18     Temp 09/13/24 1240 97.9 F (36.6 C)     Temp Source 09/13/24 1240 Oral     SpO2 09/13/24 1240 100 %     Weight 09/13/24 1241 205 lb 0.4 oz (93 kg)     Height 09/13/24 1241 5' 6 (1.676 m)     Head Circumference --      Peak Flow --      Pain Score 09/13/24 1241 10     Pain Loc --      Pain Education --      Exclude from Growth Chart --     Most recent vital signs: Vitals:   09/13/24 1240  BP: (!) 162/64  Pulse: (!) 54  Resp: 18  Temp: 97.9 F (36.6 C)  SpO2: 100%     General: Alert and oriented, no distress.  CV:  Good peripheral perfusion.  Resp:  Normal effort. Abd:  No  distention.  Other:  Urethral erosion causing approximately 2 cm hypospadias.  Trace amount of blood oozing from the area.  No active bleeding.  Bilateral lower extremity edema.  No penile, scrotal, or lower abdominal erythema, induration, or abnormal warmth.  No open wounds or lesions.   ED Results / Procedures / Treatments   Labs (all labs ordered are listed, but only abnormal results are displayed) Labs Reviewed  CBC WITH DIFFERENTIAL/PLATELET - Abnormal; Notable for the following components:      Result Value   WBC 3.3 (*)    RBC 3.02 (*)    Hemoglobin 8.8 (*)    HCT 27.9 (*)    RDW 15.9 (*)    Platelets 105 (*)    Neutro Abs 1.5 (*)    All other components within normal limits  AMMONIA - Abnormal; Notable for the following components:   Ammonia 132 (*)    All other components within normal limits  PRO BRAIN NATRIURETIC PEPTIDE -  Abnormal; Notable for the following components:   Pro Brain Natriuretic Peptide 541.0 (*)    All other components within normal limits  COMPREHENSIVE METABOLIC PANEL WITH GFR - Abnormal; Notable for the following components:   Glucose, Bld 105 (*)    Creatinine, Ser 1.32 (*)    Albumin  2.8 (*)    GFR, Estimated 56 (*)    All other components within normal limits     EKG     RADIOLOGY    PROCEDURES:  Critical Care performed: No  Procedures   MEDICATIONS ORDERED IN ED: Medications  lidocaine  (XYLOCAINE ) 2 % jelly 1 Application (has no administration in time range)     IMPRESSION / MDM / ASSESSMENT AND PLAN / ED COURSE  I reviewed the triage vital signs and the nursing notes.  74 year old male with PMH as noted above presents primarily due to pain at the urethra and apparent urethral erosion from an indwelling Foley catheter which has been present since about October.  There is no evidence of surrounding infection but the patient does report increased leg swelling and pain.  Differential diagnosis includes, but is not limited to,  urethral erosion, urethritis.  There is no evidence of cellulitis.  There is no urethral obstruction; the urine is flowing well.  We will obtain lab workup and discussed with urology.  Patient's presentation is most consistent with acute complicated illness / injury requiring diagnostic workup.  ----------------------------------------- 3:10 PM on 09/13/2024 -----------------------------------------  I consulted and discussed the case with Dr. Watt from urology.  He advised that a topical anesthetic would be appropriate and that the patient needs to follow-up in the office; and his older records that are already been a discussion of possibly replacing the Foley with a suprapubic catheter, so the patient needs to follow-up so that these arrangements can be made.  Lab workup is overall unremarkable.  CBC shows chronic, stable anemia with no leukocytosis.  CMP is unremarkable.  Ammonia is elevated although the patient clinically does not have evidence of hepatic encephalopathy.  There is no indication for acute treatment.  BNP is also minimally elevated but improved from prior.  At this time, the patient is stable for discharge home.  I counseled him on the results of the workup and on urology recommendations and he is in agreement.  He has had good relief in the past with topical lidocaine  and is eager to follow-up with urology to discuss the suprapubic option.  I have given strict return precautions to the patient and provided written return precautions and discharge and follow-up instructions for the facility.    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    Jacolyn Pae, MD 09/13/24 1511  "

## 2024-09-13 NOTE — Discharge Instructions (Signed)
 [name] should apply the topical lidocaine  jelly to the tip of the penis up to 3 times daily as needed for pain.  He needs to follow-up with [facility].  In the meantime, he should return to the ER for new, worsening, or persistent severe pain, bleeding, swelling to the penis or scrotum, urine not passing through the catheter, or any other new or worsening symptoms that are concerning.

## 2024-09-13 NOTE — ED Notes (Signed)
 This rn spoke with [name].  [place] from [place] and should be here in less than 1 hour to pick pt up.

## 2024-09-13 NOTE — ED Triage Notes (Signed)
 Pt arrives via EMS from [facility] living with reports of penile pain and that his foley catheter has split his penis.

## 2024-09-13 NOTE — ED Notes (Signed)
 Spoke with [name] who stated he would arrange transport to get the patient.

## 2024-09-14 ENCOUNTER — Other Ambulatory Visit: Payer: Self-pay

## 2024-09-15 ENCOUNTER — Other Ambulatory Visit: Payer: Self-pay

## 2024-09-16 NOTE — Progress Notes (Unsigned)
 "    09/17/2024 8:40 AM   [name] 1951/03/09 [id number]  Referring provider: [facility] [address] [place],  [place] [address]  Urological history: 1. Urinary retention - managed with a chronic Foley - had SPT, but it became dislodged  No chief complaint on file.  HPI: [name] is a 74 y.o. man who presents today for discussion concerning urethral erosion and rescheduling SPT placement.   Previous records reviewed.  He had a SPT placement in 03/2024, but it had fallen out.  He was then rescheduled in 06/2024, but he was hospitalized for sepsis and has had more hospitalizations for encephalopathy.        PMH: Past Medical History:  Diagnosis Date   (HFpEF) heart failure with preserved ejection fraction (HCC)    a.) TTE 03/20/2018: EF 60-65%, no RWMAs, mild LAE, mild-mod MR, PASP 42; b.) TTE 03/31/2021: EF 55-60%, no RWMAs, mild LVH, mild LAE, AoV sclerosis without stenosis; c.) TTE 03/25/2022: EF 60-65%, no RWMAs, AoV sclerosis without stenosis; d.) TTE 12/24/2022: EF 60-65%, no RWMAs, mild LVH, mild LAE.   Anasarca    Anemia    Anxiety    Aortic atherosclerosis    Arthritis    Asthma    Bacteremia due to Escherichia coli 05/2023   Bacteremia due to Klebsiella pneumoniae 2023   Bilateral carotid artery disease    Bradycardia    CAD (coronary artery disease) 10/21/2006   a.) MV 10/21/2006: small reversible defect involving the apical  lateral wall c/w possible ischemia; b.) MV: 03/24/2018: no ischemia; c.) MV 10/04/2021: LAD calcifications   CAD (coronary artery disease)    Cervical radiculopathy    Chest pain    a.) MV 03/21/2018: no ischemia; b.) MV 10/04/2021: no ischemia   Chronic back pain    Chronic common bile duct dilatation    Cirrhosis (HCC)    a.) (+) hepatic lesion on CT; likely hepatocellular carcinoma; b.) CT evidence of associated portal hypertension; c.) (+) abdominopelvic anasarca   COPD (chronic obstructive  pulmonary disease) (HCC)    DDD (degenerative disc disease), lumbar    Depression    Frequent falls    GERD (gastroesophageal reflux disease)    HBV (hepatitis B virus) infection    HCV (hepatitis C virus)    Hernia of anterior abdominal wall    History of 2019 novel coronavirus disease (COVID-19) 04/16/2020   Homelessness    a.) limited on placement as patient is listed on [place] sex offender listing for exploitation of a minor (possession of child pornography); incarcerated 12 years (released 11/2017)   Hyperlipidemia    Hypertension    Infestation by bed bug 02/2022   Malingering    Migraine    Nephrolithiasis    Obesity    Portal hypertension (HCC)    PTSD (post-traumatic stress disorder)    a.) brief training in the [employer] at age 1; someone was killed in front of him   Pulmonary HTN (HCC)    a.) multiple CT scans desmontrating dilated PA c/w pHTN; b.) TTE 03/20/2018: PASP 42 mmHg   Sleep apnea    a.) no nocturnal PAP therapy   Sleep apnea    Stenosis of cervical spine with myelopathy (HCC)    Suicidal ideations    T2DM (type 2 diabetes mellitus) (HCC)    Thrombocytopenia    a.) related to hepatic cirrhosis; spleen normal in size   TIA (transient ischemic attack)    TIA (transient ischemic attack)  09/30/2023    Surgical History: Past Surgical History:  Procedure Laterality Date   ANTERIOR CERVICAL DECOMP/DISCECTOMY FUSION N/A 07/01/2023   Procedure: ANTERIOR CERVICAL DISCECTOMY AND FUSION (FORGE);  Surgeon: Clois Fret, MD;  Location: [facility] ORS;  Service: Neurosurgery;  Laterality: N/A;  Keep as last case of the day per Dr Clois   ANTERIOR CERVICAL DISCECTOMY     APPENDECTOMY     CARDIAC CATHETERIZATION     CHOLECYSTECTOMY     COLONOSCOPY N/A 04/01/2024   Procedure: COLONOSCOPY;  Surgeon: Toledo, Ladell POUR, MD;  Location: [facility] ENDOSCOPY;  Service: Gastroenterology;  Laterality: N/A;   ESOPHAGOGASTRODUODENOSCOPY N/A 04/01/2024   Procedure: EGD  (ESOPHAGOGASTRODUODENOSCOPY);  Surgeon: Toledo, Ladell POUR, MD;  Location: [facility] ENDOSCOPY;  Service: Gastroenterology;  Laterality: N/A;   EYE SURGERY     INNER EAR SURGERY     IR CYSTOSTOMY TUBE PLACEMENT/BLADDER ASPIRATION  04/13/2024   NOSE SURGERY     POLYPECTOMY  04/01/2024   Procedure: POLYPECTOMY, INTESTINE;  Surgeon: Toledo, Ladell POUR, MD;  Location: [facility] ENDOSCOPY;  Service: Gastroenterology;;   XI ROBOTIC LAPAROSCOPIC ASSISTED APPENDECTOMY N/A 11/05/2022   Procedure: XI ROBOTIC LAPAROSCOPIC ASSISTED APPENDECTOMY;  Surgeon: [place] Romano, MD;  Location: [facility] ORS;  Service: General;  Laterality: N/A;    Home Medications:  Allergies as of 09/17/2024       Reactions   Bee Venom Anaphylaxis   Codeine  Itching   Only itching. Recently allergy tested at [facility]   Rsv Mrna Pre-f Virus Vaccine Swelling   Meloxicam    Other Reaction(s): ?overdose of local given at dentist--had trouble breathing   Novocain [procaine]    Sulfa Antibiotics    hallucinations        Medication List        Accurate as of September 16, 2024  8:40 AM. If you have any questions, ask your nurse or doctor.          acetaminophen  650 MG CR tablet Commonly known as: TYLENOL  Take 650 mg by mouth every 8 (eight) hours as needed for pain.   aspirin  81 MG chewable tablet Chew 81 mg by mouth daily.   atorvastatin  40 MG tablet Commonly known as: LIPITOR Take 40 mg by mouth daily.   benzocaine  10 % mucosal gel Commonly known as: ORAJEL Apply to the tip of the penis for penile pain up to four times daily prn for penile pain   citalopram  20 MG tablet Commonly known as: CELEXA  Take 20 mg by mouth daily.   feeding supplement Liqd Take 237 mLs by mouth 3 (three) times daily between meals.   furosemide  40 MG tablet Commonly known as: LASIX  Take 40 mg by mouth daily.   gabapentin  300 MG capsule Commonly known as: NEURONTIN  Take 300 mg by mouth 2 (two) times daily.   lactulose  10 GM/15ML  solution Commonly known as: CHRONULAC  Take 45 mLs (30 g total) by mouth 3 (three) times daily. Please titrate to have 2-3 soft bowel movements daily   lidocaine  2 % jelly Commonly known as: XYLOCAINE  Place 1 Application into the urethra 3 (three) times daily as needed.   methocarbamol  500 MG tablet Commonly known as: ROBAXIN  Take 1 tablet (500 mg total) by mouth every 8 (eight) hours as needed for muscle spasms.   multivitamin with minerals Tabs tablet Take 1 tablet by mouth daily.   nitroGLYCERIN  0.4 MG/SPRAY spray Commonly known as: NITROLINGUAL  Place 1 spray under the tongue every 5 (five) minutes x 3 doses as needed for chest pain.  nystatin  powder Apply 1 Application topically 3 (three) times daily.   ondansetron  4 MG tablet Commonly known as: ZOFRAN  Take 1 tablet (4 mg total) by mouth every 6 (six) hours as needed for nausea.   pantoprazole  40 MG tablet Commonly known as: PROTONIX  Take 40 mg by mouth daily.   phenol 1.4 % Liqd Commonly known as: CHLORASEPTIC Use as directed 1 spray in the mouth or throat as needed for throat irritation / pain.   QUEtiapine  50 MG tablet Commonly known as: SEROQUEL  Take 50 mg by mouth at bedtime.   senna 8.6 MG Tabs tablet Commonly known as: SENOKOT Take 1 tablet (8.6 mg total) by mouth 2 (two) times daily as needed for mild constipation.   spironolactone  25 MG tablet Commonly known as: ALDACTONE  Take 0.5 tablets (12.5 mg total) by mouth daily.   Ventolin  HFA 108 (90 Base) MCG/ACT inhaler Generic drug: albuterol  Inhale 2 puffs into the lungs every 4 (four) hours as needed for wheezing.   albuterol  (2.5 MG/3ML) 0.083% nebulizer solution Commonly known as: PROVENTIL  Take 2.5 mg by nebulization every 4 (four) hours as needed for wheezing or shortness of breath.   Xifaxan  550 MG Tabs tablet Generic drug: rifaximin  Take 1 tablet (550 mg total) by mouth 2 (two) times daily. Start AFTER finishing vancomycin          Allergies:  Allergies  Allergen Reactions   Bee Venom Anaphylaxis   Codeine  Itching    Only itching. Recently allergy tested at [facility]   Rsv Mrna Pre-F Virus Vaccine Swelling   Meloxicam     Other Reaction(s): ?overdose of local given at dentist--had trouble breathing   Novocain [Procaine]    Sulfa Antibiotics     hallucinations    Family History: Family History  Problem Relation Age of Onset   Hypertension Mother    Heart disease Mother    Heart failure Maternal Grandmother     Social History:  reports that he has been smoking cigarettes. He has never used smokeless tobacco. He reports that he does not currently use alcohol. He reports that he does not currently use drugs.  ROS: Pertinent ROS in HPI  Physical Exam: There were no vitals taken for this visit.  Constitutional:  Well nourished. Alert and oriented, No acute distress. HEENT: [place] AT, moist mucus membranes.  Trachea midline, no masses. Cardiovascular: No clubbing, cyanosis, or edema. Respiratory: Normal respiratory effort, no increased work of breathing. GI: Abdomen is soft, non tender, non distended, no abdominal masses. Liver and spleen not palpable.  No hernias appreciated.  Stool sample for occult testing is not indicated.   GU: No CVA tenderness.  No bladder fullness or masses.  Patient with circumcised/uncircumcised phallus. ***Foreskin easily retracted***  Urethral meatus is patent.  No penile discharge. No penile lesions or rashes. Scrotum without lesions, cysts, rashes and/or edema.  Testicles are located scrotally bilaterally. No masses are appreciated in the testicles. Left and right epididymis are normal. Rectal: Patient with  normal sphincter tone. Anus and perineum without scarring or rashes. No rectal masses are appreciated. Prostate is approximately *** grams, *** nodules are appreciated. Seminal vesicles are normal. Skin: No rashes, bruises or suspicious lesions. Lymph: No cervical or inguinal  adenopathy. Neurologic: Grossly intact, no focal deficits, moving all 4 extremities. Psychiatric: Normal mood and affect.   Laboratory Data: See Epic and HPI   I have reviewed the labs.   Pertinent Imaging: N/A   Assessment & Plan:    1.  Chronic urinary  retention - No follow-ups on file.  These notes generated with voice recognition software. I apologize for typographical errors.  CLOTILDA HELON RIGGERS  [facility] [address]  [address] [place], [place] [address] [phone]  "

## 2024-09-17 ENCOUNTER — Ambulatory Visit: Admitting: Urology

## 2024-09-17 DIAGNOSIS — N368 Other specified disorders of urethra: Secondary | ICD-10-CM

## 2024-09-17 DIAGNOSIS — R339 Retention of urine, unspecified: Secondary | ICD-10-CM

## 2024-09-17 NOTE — Progress Notes (Signed)
 "    09/18/2024 11:56 AM   [name] [birth year] [id number]  Referring provider: [facility] [address] [place],  [place] [address]  Urological history: 1. Urinary retention - managed with a chronic Foley - had SPT, but it became dislodged  Chief Complaint  Patient presents with   Catheter Exchange    HPI: [name] is a 74 y.o. man who presents today for discussion concerning urethral erosion and rescheduling SPT placement.   Previous records reviewed.  He had a SPT placement in 03/2024, but it had fallen out.  He was then rescheduled in 06/2024, but he was hospitalized for sepsis and has had more hospitalizations for encephalopathy.   He presents today with his caregiver, [name].    He is experiencing pain at the tip of the penis.  Patient denies any modifying or aggravating factors.  Patient denies any suprapubic/flank pain.  Patient denies any fevers, chills, nausea or vomiting.    PMH: Past Medical History:  Diagnosis Date   (HFpEF) heart failure with preserved ejection fraction (HCC)    a.) TTE 03/20/2018: EF 60-65%, no RWMAs, mild LAE, mild-mod MR, PASP 42; b.) TTE 03/31/2021: EF 55-60%, no RWMAs, mild LVH, mild LAE, AoV sclerosis without stenosis; c.) TTE 03/25/2022: EF 60-65%, no RWMAs, AoV sclerosis without stenosis; d.) TTE 12/24/2022: EF 60-65%, no RWMAs, mild LVH, mild LAE.   Anasarca    Anemia    Anxiety    Aortic atherosclerosis    Arthritis    Asthma    Bacteremia due to Escherichia coli 05/2023   Bacteremia due to Klebsiella pneumoniae 2023   Bilateral carotid artery disease    Bradycardia    CAD (coronary artery disease) 10/21/2006   a.) MV 10/21/2006: small reversible defect involving the apical  lateral wall c/w possible ischemia; b.) MV: 03/24/2018: no ischemia; c.) MV 10/04/2021: LAD calcifications   CAD (coronary artery disease)    Cervical radiculopathy    Chest pain    a.) MV 03/21/2018: no ischemia; b.) MV  10/04/2021: no ischemia   Chronic back pain    Chronic common bile duct dilatation    Cirrhosis (HCC)    a.) (+) hepatic lesion on CT; likely hepatocellular carcinoma; b.) CT evidence of associated portal hypertension; c.) (+) abdominopelvic anasarca   COPD (chronic obstructive pulmonary disease) (HCC)    DDD (degenerative disc disease), lumbar    Depression    Frequent falls    GERD (gastroesophageal reflux disease)    HBV (hepatitis B virus) infection    HCV (hepatitis C virus)    Hernia of anterior abdominal wall    History of 2019 novel coronavirus disease (COVID-19) 04/16/2020   Homelessness    a.) limited on placement as patient is listed on  sex offender listing for exploitation of a minor (possession of child pornography); incarcerated 12 years (released 11/2017)   Hyperlipidemia    Hypertension    Infestation by bed bug 02/2022   Malingering    Migraine    Nephrolithiasis    Obesity    Portal hypertension (HCC)    PTSD (post-traumatic stress disorder)    a.) brief training in [employer] at age 71; someone was killed in front of him   Pulmonary HTN (HCC)    a.) multiple CT scans desmontrating dilated PA c/w pHTN; b.) TTE 03/20/2018: PASP 42 mmHg   Sleep apnea    a.) no nocturnal PAP therapy   Sleep apnea    Stenosis of  cervical spine with myelopathy (HCC)    Suicidal ideations    T2DM (type 2 diabetes mellitus) (HCC)    Thrombocytopenia    a.) related to hepatic cirrhosis; spleen normal in size   TIA (transient ischemic attack)    TIA (transient ischemic attack) 09/30/2023    Surgical History: Past Surgical History:  Procedure Laterality Date   ANTERIOR CERVICAL DECOMP/DISCECTOMY FUSION N/A 07/01/2023   Procedure: ANTERIOR CERVICAL DISCECTOMY AND FUSION (FORGE);  Surgeon: Clois Fret, MD;  Location: [facility] ORS;  Service: Neurosurgery;  Laterality: N/A;  Keep as last case of the day per Dr Clois   ANTERIOR CERVICAL DISCECTOMY     APPENDECTOMY     CARDIAC  CATHETERIZATION     CHOLECYSTECTOMY     COLONOSCOPY N/A 04/01/2024   Procedure: COLONOSCOPY;  Surgeon: Toledo, Ladell POUR, MD;  Location: [facility] ENDOSCOPY;  Service: Gastroenterology;  Laterality: N/A;   ESOPHAGOGASTRODUODENOSCOPY N/A 04/01/2024   Procedure: EGD (ESOPHAGOGASTRODUODENOSCOPY);  Surgeon: Toledo, Ladell POUR, MD;  Location: [facility] ENDOSCOPY;  Service: Gastroenterology;  Laterality: N/A;   EYE SURGERY     INNER EAR SURGERY     IR CYSTOSTOMY TUBE PLACEMENT/BLADDER ASPIRATION  04/13/2024   NOSE SURGERY     POLYPECTOMY  04/01/2024   Procedure: POLYPECTOMY, INTESTINE;  Surgeon: Toledo, Ladell POUR, MD;  Location[facility];  Service: Gastroenterology;;   XI ROBOTIC LAPAROSCOPIC ASSISTED APPENDECTOMY N/A 11/05/2022   Procedure: XI ROBOTIC LAPAROSCOPIC ASSISTED APPENDECTOMY;  Surgeon: [place] Romano, MD;  Location: [facility] ORS;  Service: General;  Laterality: N/A;    Home Medications:  Allergies as of 09/18/2024       Reactions   Bee Venom Anaphylaxis   Codeine  Itching   Only itching. Recently allergy tested at [facility]   Rsv Mrna Pre-f Virus Vaccine Swelling   Meloxicam    Other Reaction(s): ?overdose of local given at dentist--had trouble breathing   Novocain [procaine]    Sulfa Antibiotics    hallucinations        Medication List        Accurate as of September 18, 2024 11:59 PM. If you have any questions, ask your nurse or doctor.          acetaminophen  650 MG CR tablet Commonly known as: TYLENOL  Take 650 mg by mouth every 8 (eight) hours as needed for pain.   aspirin  81 MG chewable tablet Chew 81 mg by mouth daily.   atorvastatin  40 MG tablet Commonly known as: LIPITOR Take 40 mg by mouth daily.   benzocaine  10 % mucosal gel Commonly known as: ORAJEL Apply to the tip of the penis for penile pain up to four times daily prn for penile pain   citalopram  20 MG tablet Commonly known as: CELEXA  Take 20 mg by mouth daily.   feeding supplement Liqd Take 237 mLs by  mouth 3 (three) times daily between meals.   furosemide  40 MG tablet Commonly known as: LASIX  Take 40 mg by mouth daily.   gabapentin  300 MG capsule Commonly known as: NEURONTIN  Take 300 mg by mouth 2 (two) times daily.   Jelcaine Sterile 2 % jelly Generic drug: lidocaine  Place 1 Application into the urethra 3 (three) times daily as needed.   lactulose  10 GM/15ML solution Commonly known as: CHRONULAC  Take 45 mLs (30 g total) by mouth 3 (three) times daily. Please titrate to have 2-3 soft bowel movements daily   lidocaine  2 % solution Commonly known as: XYLOCAINE  Use as directed 15 mLs in the mouth or throat as  needed for mouth pain. Started by: Shizuko Wojdyla   methocarbamol  500 MG tablet Commonly known as: ROBAXIN  Take 1 tablet (500 mg total) by mouth every 8 (eight) hours as needed for muscle spasms.   multivitamin with minerals Tabs tablet Take 1 tablet by mouth daily.   nitroGLYCERIN  0.4 MG/SPRAY spray Commonly known as: NITROLINGUAL  Place 1 spray under the tongue every 5 (five) minutes x 3 doses as needed for chest pain.   nystatin  powder Apply 1 Application topically 3 (three) times daily.   ondansetron  4 MG tablet Commonly known as: ZOFRAN  Take 1 tablet (4 mg total) by mouth every 6 (six) hours as needed for nausea.   pantoprazole  40 MG tablet Commonly known as: PROTONIX  Take 40 mg by mouth daily.   phenol 1.4 % Liqd Commonly known as: CHLORASEPTIC Use as directed 1 spray in the mouth or throat as needed for throat irritation / pain.   QUEtiapine  50 MG tablet Commonly known as: SEROQUEL  Take 50 mg by mouth at bedtime.   senna 8.6 MG Tabs tablet Commonly known as: SENOKOT Take 1 tablet (8.6 mg total) by mouth 2 (two) times daily as needed for mild constipation.   spironolactone  25 MG tablet Commonly known as: ALDACTONE  Take 0.5 tablets (12.5 mg total) by mouth daily.   Ventolin  HFA 108 (90 Base) MCG/ACT inhaler Generic drug: albuterol  Inhale 2  puffs into the lungs every 4 (four) hours as needed for wheezing.   albuterol  (2.5 MG/3ML) 0.083% nebulizer solution Commonly known as: PROVENTIL  Take 2.5 mg by nebulization every 4 (four) hours as needed for wheezing or shortness of breath.   Xifaxan  550 MG Tabs tablet Generic drug: rifaximin  Take 1 tablet (550 mg total) by mouth 2 (two) times daily. Start AFTER finishing vancomycin         Allergies:  Allergies  Allergen Reactions   Bee Venom Anaphylaxis   Codeine  Itching    Only itching. Recently allergy tested at [facility]   Rsv Mrna Pre-F Virus Vaccine Swelling   Meloxicam     Other Reaction(s): ?overdose of local given at dentist--had trouble breathing   Novocain [Procaine]    Sulfa Antibiotics     hallucinations    Family History: Family History  Problem Relation Age of Onset   Hypertension Mother    Heart disease Mother    Heart failure Maternal Grandmother     Social History:  reports that he has been smoking cigarettes. He has never used smokeless tobacco. He reports that he does not currently use alcohol. He reports that he does not currently use drugs.  ROS: Pertinent ROS in HPI  Physical Exam: BP (!) 179/71   Pulse (!) 50   SpO2 100%   Constitutional:  Well nourished. Alert and oriented, No acute distress. HEENT: [place] AT, moist mucus membranes.  Trachea midline Cardiovascular: No clubbing, cyanosis, or edema. Respiratory: Normal respiratory effort, no increased work of breathing. GU: No CVA tenderness.  No bladder fullness or masses.  Patient with uncircumcised phallus. Foreskin easily retracted  Urethra with  erosion just past the coronal edge.   No penile discharge. No penile lesions or rashes. Scrotum without lesions, cysts, rashes and/or edema.   Neurologic: Grossly intact, no focal deficits, moving all 4 extremities. Psychiatric: Normal mood and affect.   Laboratory Data: See Epic and HPI   I have reviewed the labs.   Pertinent Imaging: N/A  Cath  Change/ Replacement  Patient is present today for a catheter change due to urinary retention.  8ml of water was removed from the balloon, a 18 FR foley cath was removed without difficulty.  Patient was cleaned and prepped in a sterile fashion with betadine and 2% lidocaine  jelly was instilled into the urethra. A 18 FR coude foley cath was replaced into the bladder, no complications were noted. Urine return was noted 50 ml and urine was yellow in color. The balloon was filled with 10ml of sterile water. A leg bag was attached for drainage.  A night bag was also given to the patient and patient was given instruction on how to change from one bag to another. Patient was given proper instruction on catheter care.    Performed by: CLOTILDA CORNWALL, PA-C and Gabby Aiden, PA-C and Tamina A McCain, LPN   Assessment & Plan:    1.  Chronic urinary retention - managed with an indwelling Foley to be exchanged monthly - Foley exchanged today - Follow up for catheter exchanged scheduled for one month, but will adjust if he undergoes SPT placement prior  2. Urethral erosion - will have the patient undergo SPT placement with IR  - patient prescribed vicious lidocaine  to apply on the tip of the urethra for pain  - orders submitted  Return in about 1 month (around 10/19/2024) for Foley exchange; IR for SPT .  These notes generated with voice recognition software. I apologize for typographical errors.  CLOTILDA CORNWALL RIGGERS  [facility] [address]  [address] [place], [place] [address] [phone]  "

## 2024-09-18 ENCOUNTER — Other Ambulatory Visit: Payer: Self-pay

## 2024-09-18 ENCOUNTER — Ambulatory Visit: Admitting: Urology

## 2024-09-18 VITALS — BP 179/71 | HR 50

## 2024-09-18 DIAGNOSIS — R338 Other retention of urine: Secondary | ICD-10-CM

## 2024-09-18 DIAGNOSIS — N4889 Other specified disorders of penis: Secondary | ICD-10-CM

## 2024-09-18 DIAGNOSIS — T8389XA Other specified complication of genitourinary prosthetic devices, implants and grafts, initial encounter: Secondary | ICD-10-CM | POA: Diagnosis not present

## 2024-09-18 DIAGNOSIS — N368 Other specified disorders of urethra: Secondary | ICD-10-CM | POA: Diagnosis not present

## 2024-09-18 MED ORDER — LIDOCAINE VISCOUS HCL 2 % MT SOLN: 15.0000 mL | OROMUCOSAL | 0 refills | Status: AC | PRN

## 2024-09-19 ENCOUNTER — Encounter: Payer: Self-pay | Admitting: Urology

## 2024-09-24 ENCOUNTER — Inpatient Hospital Stay
Admission: EM | Admit: 2024-09-24 | Discharge: 2024-09-29 | DRG: 442 | Disposition: A | Discharge status: Skilled Nursing Facility | Attending: Internal Medicine | Admitting: Internal Medicine

## 2024-09-24 ENCOUNTER — Other Ambulatory Visit: Payer: Self-pay

## 2024-09-24 ENCOUNTER — Emergency Department

## 2024-09-24 DIAGNOSIS — E66812 Obesity, class 2: Secondary | ICD-10-CM | POA: Diagnosis present

## 2024-09-24 DIAGNOSIS — G629 Polyneuropathy, unspecified: Secondary | ICD-10-CM

## 2024-09-24 DIAGNOSIS — E1122 Type 2 diabetes mellitus with diabetic chronic kidney disease: Secondary | ICD-10-CM | POA: Diagnosis present

## 2024-09-24 DIAGNOSIS — E785 Hyperlipidemia, unspecified: Secondary | ICD-10-CM | POA: Diagnosis present

## 2024-09-24 DIAGNOSIS — K219 Gastro-esophageal reflux disease without esophagitis: Secondary | ICD-10-CM | POA: Diagnosis present

## 2024-09-24 DIAGNOSIS — Z751 Person awaiting admission to adequate facility elsewhere: Secondary | ICD-10-CM

## 2024-09-24 DIAGNOSIS — Z87442 Personal history of urinary calculi: Secondary | ICD-10-CM

## 2024-09-24 DIAGNOSIS — I2510 Atherosclerotic heart disease of native coronary artery without angina pectoris: Secondary | ICD-10-CM | POA: Diagnosis present

## 2024-09-24 DIAGNOSIS — K7682 Hepatic encephalopathy: Secondary | ICD-10-CM | POA: Diagnosis not present

## 2024-09-24 DIAGNOSIS — R296 Repeated falls: Secondary | ICD-10-CM | POA: Diagnosis present

## 2024-09-24 DIAGNOSIS — Z8616 Personal history of COVID-19: Secondary | ICD-10-CM

## 2024-09-24 DIAGNOSIS — Z9181 History of falling: Secondary | ICD-10-CM

## 2024-09-24 DIAGNOSIS — E8809 Other disorders of plasma-protein metabolism, not elsewhere classified: Secondary | ICD-10-CM | POA: Diagnosis present

## 2024-09-24 DIAGNOSIS — D696 Thrombocytopenia, unspecified: Secondary | ICD-10-CM | POA: Diagnosis present

## 2024-09-24 DIAGNOSIS — Z981 Arthrodesis status: Secondary | ICD-10-CM

## 2024-09-24 DIAGNOSIS — E876 Hypokalemia: Secondary | ICD-10-CM | POA: Diagnosis present

## 2024-09-24 DIAGNOSIS — K766 Portal hypertension: Secondary | ICD-10-CM | POA: Diagnosis present

## 2024-09-24 DIAGNOSIS — Z886 Allergy status to analgesic agent status: Secondary | ICD-10-CM

## 2024-09-24 DIAGNOSIS — I10 Essential (primary) hypertension: Secondary | ICD-10-CM | POA: Diagnosis present

## 2024-09-24 DIAGNOSIS — T839XXA Unspecified complication of genitourinary prosthetic device, implant and graft, initial encounter: Secondary | ICD-10-CM | POA: Diagnosis present

## 2024-09-24 DIAGNOSIS — Z8673 Personal history of transient ischemic attack (TIA), and cerebral infarction without residual deficits: Secondary | ICD-10-CM

## 2024-09-24 DIAGNOSIS — Z884 Allergy status to anesthetic agent status: Secondary | ICD-10-CM

## 2024-09-24 DIAGNOSIS — Z9049 Acquired absence of other specified parts of digestive tract: Secondary | ICD-10-CM

## 2024-09-24 DIAGNOSIS — F332 Major depressive disorder, 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 psychotic features: Secondary | ICD-10-CM | POA: Diagnosis present

## 2024-09-24 DIAGNOSIS — Z885 Allergy status to narcotic agent status: Secondary | ICD-10-CM

## 2024-09-24 DIAGNOSIS — N1831 Chronic kidney disease, stage 3a: Secondary | ICD-10-CM | POA: Diagnosis present

## 2024-09-24 DIAGNOSIS — E11649 Type 2 diabetes mellitus with hypoglycemia without coma: Secondary | ICD-10-CM | POA: Diagnosis not present

## 2024-09-24 DIAGNOSIS — Z6839 Body mass index (BMI) 39.0-39.9, adult: Secondary | ICD-10-CM

## 2024-09-24 DIAGNOSIS — E872 Acidosis, unspecified: Secondary | ICD-10-CM | POA: Diagnosis present

## 2024-09-24 DIAGNOSIS — J4489 Other specified chronic obstructive pulmonary disease: Secondary | ICD-10-CM | POA: Diagnosis present

## 2024-09-24 DIAGNOSIS — E1140 Type 2 diabetes mellitus with diabetic neuropathy, unspecified: Secondary | ICD-10-CM | POA: Diagnosis present

## 2024-09-24 DIAGNOSIS — R339 Retention of urine, unspecified: Secondary | ICD-10-CM | POA: Diagnosis present

## 2024-09-24 DIAGNOSIS — Z8249 Family history of ischemic heart disease and other diseases of the circulatory system: Secondary | ICD-10-CM

## 2024-09-24 DIAGNOSIS — R54 Age-related physical debility: Secondary | ICD-10-CM | POA: Diagnosis present

## 2024-09-24 DIAGNOSIS — Z7984 Long term (current) use of oral hypoglycemic drugs: Secondary | ICD-10-CM

## 2024-09-24 DIAGNOSIS — Z87898 Personal history of other specified conditions: Secondary | ICD-10-CM

## 2024-09-24 DIAGNOSIS — E669 Obesity, unspecified: Secondary | ICD-10-CM | POA: Diagnosis present

## 2024-09-24 DIAGNOSIS — I5032 Chronic diastolic (congestive) heart failure: Secondary | ICD-10-CM | POA: Diagnosis present

## 2024-09-24 DIAGNOSIS — E119 Type 2 diabetes mellitus without complications: Secondary | ICD-10-CM

## 2024-09-24 DIAGNOSIS — Z7982 Long term (current) use of aspirin: Secondary | ICD-10-CM

## 2024-09-24 DIAGNOSIS — G43909 Migraine, unspecified, not intractable, without status migrainosus: Secondary | ICD-10-CM | POA: Diagnosis present

## 2024-09-24 DIAGNOSIS — F17210 Nicotine dependence, cigarettes, uncomplicated: Secondary | ICD-10-CM | POA: Diagnosis present

## 2024-09-24 DIAGNOSIS — R8281 Pyuria: Secondary | ICD-10-CM | POA: Diagnosis present

## 2024-09-24 DIAGNOSIS — D61818 Other pancytopenia: Secondary | ICD-10-CM | POA: Diagnosis present

## 2024-09-24 DIAGNOSIS — W19XXXA Unspecified fall, initial encounter: Principal | ICD-10-CM

## 2024-09-24 DIAGNOSIS — I130 Hypertensive heart and chronic kidney disease with heart failure and stage 1 through stage 4 chronic kidney disease, or unspecified chronic kidney disease: Secondary | ICD-10-CM | POA: Diagnosis present

## 2024-09-24 DIAGNOSIS — Z8619 Personal history of other infectious and parasitic diseases: Secondary | ICD-10-CM

## 2024-09-24 DIAGNOSIS — Z882 Allergy status to sulfonamides status: Secondary | ICD-10-CM

## 2024-09-24 DIAGNOSIS — J449 Chronic obstructive pulmonary disease, unspecified: Secondary | ICD-10-CM | POA: Diagnosis present

## 2024-09-24 DIAGNOSIS — K7460 Unspecified cirrhosis of liver: Secondary | ICD-10-CM | POA: Diagnosis present

## 2024-09-24 DIAGNOSIS — M549 Dorsalgia, unspecified: Secondary | ICD-10-CM | POA: Diagnosis present

## 2024-09-24 DIAGNOSIS — Z79899 Other long term (current) drug therapy: Secondary | ICD-10-CM

## 2024-09-24 DIAGNOSIS — R531 Weakness: Secondary | ICD-10-CM

## 2024-09-24 LAB — COMPREHENSIVE METABOLIC PANEL WITH GFR
ALT: 14 U/L (ref 0–44)
AST: 32 U/L (ref 15–41)
Albumin: 3 g/dL — ABNORMAL LOW (ref 3.5–5.0)
Alkaline Phosphatase: 81 U/L (ref 38–126)
Anion gap: 11 (ref 5–15)
BUN: 13 mg/dL (ref 8–23)
CO2: 21 mmol/L — ABNORMAL LOW (ref 22–32)
Calcium: 9.8 mg/dL (ref 8.9–10.3)
Chloride: 110 mmol/L (ref 98–111)
Creatinine, Ser: 1.34 mg/dL — ABNORMAL HIGH (ref 0.61–1.24)
GFR, Estimated: 55 mL/min — ABNORMAL LOW
Glucose, Bld: 83 mg/dL (ref 70–99)
Potassium: 3.5 mmol/L (ref 3.5–5.1)
Sodium: 143 mmol/L (ref 135–145)
Total Bilirubin: 1.6 mg/dL — ABNORMAL HIGH (ref 0.0–1.2)
Total Protein: 7.2 g/dL (ref 6.5–8.1)

## 2024-09-24 LAB — CBC WITH DIFFERENTIAL/PLATELET
Abs Immature Granulocytes: 0.02 10*3/uL (ref 0.00–0.07)
Basophils Absolute: 0.1 10*3/uL (ref 0.0–0.1)
Basophils Relative: 2 %
Eosinophils Absolute: 0.1 10*3/uL (ref 0.0–0.5)
Eosinophils Relative: 3 %
HCT: 30.5 % — ABNORMAL LOW (ref 39.0–52.0)
Hemoglobin: 9.6 g/dL — ABNORMAL LOW (ref 13.0–17.0)
Immature Granulocytes: 1 %
Lymphocytes Relative: 20 %
Lymphs Abs: 0.8 10*3/uL (ref 0.7–4.0)
MCH: 28.9 pg (ref 26.0–34.0)
MCHC: 31.5 g/dL (ref 30.0–36.0)
MCV: 91.9 fL (ref 80.0–100.0)
Monocytes Absolute: 0.4 10*3/uL (ref 0.1–1.0)
Monocytes Relative: 10 %
Neutro Abs: 2.5 10*3/uL (ref 1.7–7.7)
Neutrophils Relative %: 64 %
Platelets: 62 10*3/uL — ABNORMAL LOW (ref 150–400)
RBC: 3.32 MIL/uL — ABNORMAL LOW (ref 4.22–5.81)
RDW: 15.9 % — ABNORMAL HIGH (ref 11.5–15.5)
WBC: 3.8 10*3/uL — ABNORMAL LOW (ref 4.0–10.5)
nRBC: 0 % (ref 0.0–0.2)

## 2024-09-24 LAB — CBG MONITORING, ED
Glucose-Capillary: 72 mg/dL (ref 70–99)
Glucose-Capillary: 68 mg/dL — ABNORMAL LOW (ref 70–99)

## 2024-09-24 LAB — MAGNESIUM: Magnesium: 1.4 mg/dL — ABNORMAL LOW (ref 1.7–2.4)

## 2024-09-24 LAB — IMMATURE PLATELET FRACTION: Immature Platelet Fraction: 1.8 % (ref 1.2–8.6)

## 2024-09-24 LAB — AMMONIA: Ammonia: 144 umol/L — ABNORMAL HIGH (ref 9–35)

## 2024-09-24 LAB — TROPONIN T, HIGH SENSITIVITY
Troponin T High Sensitivity: 23 ng/L — ABNORMAL HIGH (ref 0–19)
Troponin T High Sensitivity: 22 ng/L — ABNORMAL HIGH (ref 0–19)

## 2024-09-24 LAB — PRO BRAIN NATRIURETIC PEPTIDE: Pro Brain Natriuretic Peptide: 764 pg/mL — ABNORMAL HIGH

## 2024-09-24 LAB — PROTIME-INR
INR: 1.2 (ref 0.8–1.2)
Prothrombin Time: 16.1 s — ABNORMAL HIGH (ref 11.4–15.2)

## 2024-09-24 LAB — TECHNOLOGIST SMEAR REVIEW: Plt Morphology: NORMAL

## 2024-09-24 LAB — CK: Total CK: 78 U/L (ref 49–397)

## 2024-09-24 LAB — GLUCOSE, CAPILLARY: Glucose-Capillary: 92 mg/dL (ref 70–99)

## 2024-09-24 MED ORDER — RIFAXIMIN 550 MG PO TABS
550.0000 mg | ORAL_TABLET | Freq: Two times a day (BID) | ORAL | Status: DC
  Administered 2024-09-25 – 2024-09-29 (×9): 550 mg via ORAL
  Filled 2024-09-24 (×13): qty 1

## 2024-09-24 MED ORDER — LACTATED RINGERS IV SOLN: INTRAVENOUS | Status: AC

## 2024-09-24 MED ORDER — QUETIAPINE FUMARATE 25 MG PO TABS
50.0000 mg | ORAL_TABLET | Freq: Every day | ORAL | Status: DC
  Administered 2024-09-24 – 2024-09-28 (×5): 50 mg via ORAL
  Filled 2024-09-24 (×5): qty 2

## 2024-09-24 MED ORDER — PANTOPRAZOLE SODIUM 40 MG PO TBEC
40.0000 mg | DELAYED_RELEASE_TABLET | Freq: Every day | ORAL | Status: DC
  Administered 2024-09-24 – 2024-09-29 (×6): 40 mg via ORAL
  Filled 2024-09-24 (×6): qty 1

## 2024-09-24 MED ORDER — CITALOPRAM HYDROBROMIDE 20 MG PO TABS
20.0000 mg | ORAL_TABLET | Freq: Every day | ORAL | Status: DC
  Administered 2024-09-24 – 2024-09-29 (×6): 20 mg via ORAL
  Filled 2024-09-24 (×6): qty 1

## 2024-09-24 MED ORDER — ONDANSETRON HCL 4 MG PO TABS: 4.0000 mg | ORAL_TABLET | Freq: Four times a day (QID) | ORAL | Status: DC | PRN

## 2024-09-24 MED ORDER — ASPIRIN 81 MG PO CHEW
81.0000 mg | CHEWABLE_TABLET | Freq: Every day | ORAL | Status: DC
  Administered 2024-09-24 – 2024-09-29 (×6): 81 mg via ORAL
  Filled 2024-09-24 (×6): qty 1

## 2024-09-24 MED ORDER — OXYCODONE HCL 5 MG PO TABS
2.5000 mg | ORAL_TABLET | Freq: Once | ORAL | Status: AC
  Administered 2024-09-24: 2.5 mg via ORAL
  Filled 2024-09-24: qty 1

## 2024-09-24 MED ORDER — LACTULOSE 10 GM/15ML PO SOLN
30.0000 g | Freq: Three times a day (TID) | ORAL | Status: DC
  Administered 2024-09-24 – 2024-09-29 (×12): 30 g via ORAL
  Filled 2024-09-24 (×2): qty 60
  Filled 2024-09-24: qty 45
  Filled 2024-09-24 (×10): qty 60

## 2024-09-24 MED ORDER — SODIUM CHLORIDE 0.9% FLUSH
3.0000 mL | Freq: Two times a day (BID) | INTRAVENOUS | Status: DC
  Administered 2024-09-25 – 2024-09-29 (×9): 3 mL via INTRAVENOUS

## 2024-09-24 MED ORDER — LIDOCAINE 5 % EX PTCH
1.0000 | MEDICATED_PATCH | CUTANEOUS | Status: DC
  Administered 2024-09-24 – 2024-09-29 (×6): 1 via TRANSDERMAL
  Filled 2024-09-24 (×6): qty 1

## 2024-09-24 MED ORDER — FUROSEMIDE 40 MG PO TABS
40.0000 mg | ORAL_TABLET | Freq: Every day | ORAL | Status: DC
  Administered 2024-09-24 – 2024-09-29 (×6): 40 mg via ORAL
  Filled 2024-09-24 (×6): qty 1

## 2024-09-24 MED ORDER — ONDANSETRON HCL 4 MG/2ML IJ SOLN: 4.0000 mg | Freq: Four times a day (QID) | INTRAMUSCULAR | Status: DC | PRN

## 2024-09-24 MED ORDER — ACETAMINOPHEN 325 MG PO TABS
650.0000 mg | ORAL_TABLET | Freq: Four times a day (QID) | ORAL | Status: DC | PRN
  Administered 2024-09-25 (×2): 650 mg via ORAL
  Filled 2024-09-24 (×2): qty 2

## 2024-09-24 MED ORDER — ACETAMINOPHEN 650 MG RE SUPP: 650.0000 mg | Freq: Four times a day (QID) | RECTAL | Status: DC | PRN

## 2024-09-24 MED ORDER — INSULIN ASPART 100 UNIT/ML IJ SOLN: 0.0000 [IU] | Freq: Three times a day (TID) | INTRAMUSCULAR | Status: DC

## 2024-09-24 MED ORDER — IOHEXOL 300 MG/ML  SOLN
100.0000 mL | Freq: Once | INTRAMUSCULAR | Status: AC | PRN
  Administered 2024-09-24: 100 mL via INTRAVENOUS

## 2024-09-24 MED ORDER — SENNOSIDES-DOCUSATE SODIUM 8.6-50 MG PO TABS: 1.0000 | ORAL_TABLET | Freq: Every evening | ORAL | Status: DC | PRN

## 2024-09-24 MED ORDER — GLUCOSE 40 % PO GEL
1.0000 | Freq: Once | ORAL | Status: AC
  Administered 2024-09-24: 31 g via ORAL
  Filled 2024-09-24: qty 1

## 2024-09-24 MED ORDER — LACTULOSE 10 GM/15ML PO SOLN
20.0000 g | Freq: Once | ORAL | Status: AC
  Administered 2024-09-24: 20 g via ORAL
  Filled 2024-09-24: qty 30

## 2024-09-24 MED ORDER — GABAPENTIN 300 MG PO CAPS
300.0000 mg | ORAL_CAPSULE | Freq: Two times a day (BID) | ORAL | Status: DC
  Administered 2024-09-24 – 2024-09-29 (×10): 300 mg via ORAL
  Filled 2024-09-24 (×10): qty 1

## 2024-09-24 MED ORDER — SPIRONOLACTONE 12.5 MG HALF TABLET
12.5000 mg | ORAL_TABLET | Freq: Every day | ORAL | Status: DC
  Administered 2024-09-24 – 2024-09-29 (×6): 12.5 mg via ORAL
  Filled 2024-09-24 (×6): qty 1

## 2024-09-24 NOTE — ED Provider Notes (Signed)
 "  [facility] Provider Note    Event Date/Time   First MD Initiated Contact with Patient 09/24/24 [phone]     (approximate)   History   Fall   HPI  [name] is a 74 y.o. male  hepatic encephalopathy, COPD, CHF, HTN, DM who comes in with a concern for a fall.  Patient reportedly has had some generalized weakness over the past 2 days.  Patient has a history of hepatic encephalopathy  I reviewed a note from 08/16/2024 where patient was C. difficile positive patient was on oral Vanco for 10 days.  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  He reports that he had a mechanical fall.  He reports walking without any aids and he tripped and fell.  He reports pain across his entire spine.  He states that he did not hit his head from what he remembers but he does report having a headache although reports history is of chronic migraines.  Patient reports having pain across his entire back.  He does not feel like he lost consciousness.  Physical Exam   Triage Vital Signs: ED Triage Vitals  Encounter Vitals Group     BP 09/24/24 0752 (!) 158/66     Girls Systolic BP Percentile --      Girls Diastolic BP Percentile --      Boys Systolic BP Percentile --      Boys Diastolic BP Percentile --      Pulse Rate 09/24/24 0752 (!) 51     Resp 09/24/24 0752 16     Temp --      Temp src --      SpO2 09/24/24 0752 98 %     Weight 09/24/24 0751 205 lb 0.4 oz (93 kg)     Height 09/24/24 0751 5' 6 (1.676 m)     Head Circumference --      Peak Flow --      Pain Score 09/24/24 0750 5     Pain Loc --      Pain Education --      Exclude from Growth Chart --     Most recent vital signs: Vitals:   09/24/24 0752  BP: (!) 158/66  Pulse: (!) 51  Resp: 16  SpO2: 98%     General: Awake, no distress.  CV:  Good peripheral perfusion.   Resp:  Normal effort.  Abd:  No distention.  Other:  Diffuse pain noted over his entire back.  1+ edema noted in bilateral legs he reports some calf tenderness bilaterally.   ED Results / Procedures / Treatments   Labs (all labs ordered are listed, but only abnormal results are displayed) Labs Reviewed  CBC WITH DIFFERENTIAL/PLATELET - Abnormal; Notable for the following components:      Result Value   WBC 3.8 (*)    RBC 3.32 (*)    Hemoglobin 9.6 (*)    HCT 30.5 (*)    RDW 15.9 (*)    Platelets 62 (*)    All other components within normal limits  COMPREHENSIVE METABOLIC PANEL WITH GFR - Abnormal; Notable for the following components:   CO2 21 (*)    Creatinine, Ser 1.34 (*)    Albumin  3.0 (*)    Total Bilirubin 1.6 (*)    GFR, Estimated 55 (*)    All other  components within normal limits  PRO BRAIN NATRIURETIC PEPTIDE - Abnormal; Notable for the following components:   Pro Brain Natriuretic Peptide 764.0 (*)    All other components within normal limits  PROTIME-INR - Abnormal; Notable for the following components:   Prothrombin Time 16.1 (*)    All other components within normal limits  AMMONIA - Abnormal; Notable for the following components:   Ammonia 144 (*)    All other components within normal limits  TROPONIN T, HIGH SENSITIVITY - Abnormal; Notable for the following components:   Troponin T High Sensitivity 23 (*)    All other components within normal limits  CK     EKG  My interpretation of EKG:  Possible A-fib versus sinus at a rate of 52 without any ST elevation or T wave inversions, normal intervals.  Reviewed prior EKG and he was a sinus bradycardia  RADIOLOGY I have reviewed the xray personally and interpreted no evidence of any fracture   PROCEDURES:  Critical Care performed: No  Procedures   MEDICATIONS ORDERED IN ED: Medications - No data to display   IMPRESSION / MDM / ASSESSMENT AND PLAN / ED COURSE  I reviewed the triage vital signs  and the nursing notes.   Patient's presentation is most consistent with acute presentation with potential threat to life or bodily function.   Patient comes in with probable mechanical fall with diffuse pain across his entire body.  Patient is a cirrhotic and has been feeling more weak as well therefore workup was done to evaluate for ACS, ammonia level elevation.  Will hold off on UA given no fever and patient has chronically abnormal urine secondary to chronic indwelling Foley.  Will get CT imaging given patient has low platelets to ensure no evidence of intracranial hemorrhage, cervical fracture, intra abdominal or intrathoracic injuries given diffuse pain across his back.  Ammonia level increasing from 11 days ago is now up to 144 CK is normal CBC shows white count but is around baseline platelets are low at 62 CMP is reassuring Troponin is elevated we will continue to trend BNP is elevated   I reviewed notes where patient was seen on 09/13/2024 where ammonia level was elevated but he had no symptoms to suggest hepatic encephalopathy and so was discharged back home  Glucose here was 83  X-rays were grossly reassuring  CT negative   Given patient's weakness, elevated ammonia levels and fall will discuss to hospital team for admission.  Patient is alert enough to take medication by mouth and lactulose  has been ordered.  The patient is on the cardiac monitor to evaluate for evidence of arrhythmia and/or significant heart rate changes.      FINAL CLINICAL IMPRESSION(S) / ED DIAGNOSES   Final diagnoses:  Fall, initial encounter  Weakness  Hepatic encephalopathy (HCC)     Rx / DC Orders   ED Discharge Orders     None        Note:  This document was prepared using Dragon voice recognition software and may include unintentional dictation errors.   Ernest Ronal BRAVO, MD 09/24/24 1257  "

## 2024-09-24 NOTE — ED Notes (Signed)
 Fernand, MD, made aware of glucose 68. Glucose gel ordered and administered (see MAR).

## 2024-09-24 NOTE — H&P (Addendum)
 " History and Physical    [name] FMW:[id number] DOB: 1951/04/25 DOA: 09/24/2024  DOS: the patient was seen and examined on 09/24/2024  PCP: [employer]   Patient coming from: Group Home, [facility]  I have personally briefly reviewed patient's old medical records in [facility] and CareEverywhere  HPI:   [name] is a 74 y.o. year old male with medical history of hypertension, hyperlipidemia, type 2 diabetes, decompensated hepatic cirrhosis, hepatitis C infection, COPD, CHF (EF 55% in 06/2024) presenting to the ED after a fall.  Patient states there was water on the floor and he slipped and fell.  Patient lives in a group home and states they provide medications and assist him with his ADLs.  He states he has not taken his lactulose  over the last week.  When asked the reason he is unable to tell me.  He is alert but oriented to person, place but not time.  His only complaint is his back pain.  Patient was hemodynamically stable.  Lab work and imaging obtained in the ED.  CBC with pancytopenia that is chronic but hemoglobin is improved from previous and thrombocytopenia is slightly below his baseline.  CMP stable renal function, none anion gap metabolic acidosis, and mild hypoalbuminemia and hyperbilirubinemia.  Troponin mildly elevated but stable.  CK within normal limits.  proBNP mildly elevated at 764 which is close to his baseline.  Ammonia level elevated at 144.  This has been uptrending with it being 1 3211 days ago when he was in the ED.  CT head without any acute findings.  CT trauma scan negative for acute findings.  Given patient has uptrending ammonia level along with development encephalopathy, TRH contacted for admission.  Review of Systems: As mentioned in the history of present illness. All other systems reviewed and are negative.   Past Medical History:  Diagnosis Date   (HFpEF) heart failure with preserved ejection fraction (HCC)     a.) TTE 03/20/2018: EF 60-65%, no RWMAs, mild LAE, mild-mod MR, PASP 42; b.) TTE 03/31/2021: EF 55-60%, no RWMAs, mild LVH, mild LAE, AoV sclerosis without stenosis; c.) TTE 03/25/2022: EF 60-65%, no RWMAs, AoV sclerosis without stenosis; d.) TTE 12/24/2022: EF 60-65%, no RWMAs, mild LVH, mild LAE.   Anasarca    Anemia    Anxiety    Aortic atherosclerosis    Arthritis    Asthma    Bacteremia due to Escherichia coli 05/2023   Bacteremia due to Klebsiella pneumoniae 2023   Bilateral carotid artery disease    Bradycardia    CAD (coronary artery disease) 10/21/2006   a.) MV 10/21/2006: small reversible defect involving the apical  lateral wall c/w possible ischemia; b.) MV: 03/24/2018: no ischemia; c.) MV 10/04/2021: LAD calcifications   CAD (coronary artery disease)    Cervical radiculopathy    Chest pain    a.) MV 03/21/2018: no ischemia; b.) MV 10/04/2021: no ischemia   Chronic back pain    Chronic common bile duct dilatation    Cirrhosis (HCC)    a.) (+) hepatic lesion on CT; likely hepatocellular carcinoma; b.) CT evidence of associated portal hypertension; c.) (+) abdominopelvic anasarca   COPD (chronic obstructive pulmonary disease) (HCC)    DDD (degenerative disc disease), lumbar    Depression    Frequent falls    GERD (gastroesophageal reflux disease)    HBV (hepatitis B virus) infection    HCV (hepatitis C virus)    Hernia  of anterior abdominal wall    History of 2019 novel coronavirus disease (COVID-19) 04/16/2020   Homelessness    a.) limited on placement as patient is listed on [place] sex offender listing for exploitation of a minor (possession of child pornography); incarcerated 12 years (released 11/2017)   Hyperlipidemia    Hypertension    Infestation by bed bug 02/2022   Malingering    Migraine    Nephrolithiasis    Obesity    Portal hypertension (HCC)    PTSD (post-traumatic stress disorder)    a.) brief training in the [employer] at age 43; someone was killed in front of  him   Pulmonary HTN (HCC)    a.) multiple CT scans desmontrating dilated PA c/w pHTN; b.) TTE 03/20/2018: PASP 42 mmHg   Sleep apnea    a.) no nocturnal PAP therapy   Sleep apnea    Stenosis of cervical spine with myelopathy (HCC)    Suicidal ideations    T2DM (type 2 diabetes mellitus) (HCC)    Thrombocytopenia    a.) related to hepatic cirrhosis; spleen normal in size   TIA (transient ischemic attack)    TIA (transient ischemic attack) 09/30/2023    Past Surgical History:  Procedure Laterality Date   ANTERIOR CERVICAL DECOMP/DISCECTOMY FUSION N/A 07/01/2023   Procedure: ANTERIOR CERVICAL DISCECTOMY AND FUSION (FORGE);  Surgeon: Clois Fret, MD;  Location: [facility] ORS;  Service: Neurosurgery;  Laterality: N/A;  Keep as last case of the day per Dr Clois   ANTERIOR CERVICAL DISCECTOMY     APPENDECTOMY     CARDIAC CATHETERIZATION     CHOLECYSTECTOMY     COLONOSCOPY N/A 04/01/2024   Procedure: COLONOSCOPY;  Surgeon: Toledo, Ladell POUR, MD;  Location: [facility] ENDOSCOPY;  Service: Gastroenterology;  Laterality: N/A;   ESOPHAGOGASTRODUODENOSCOPY N/A 04/01/2024   Procedure: EGD (ESOPHAGOGASTRODUODENOSCOPY);  Surgeon: Toledo, Ladell POUR, MD;  Location: [facility] ENDOSCOPY;  Service: Gastroenterology;  Laterality: N/A;   EYE SURGERY     INNER EAR SURGERY     IR CYSTOSTOMY TUBE PLACEMENT/BLADDER ASPIRATION  04/13/2024   NOSE SURGERY     POLYPECTOMY  04/01/2024   Procedure: POLYPECTOMY, INTESTINE;  Surgeon: Toledo, Ladell POUR, MD;  Location: [facility] ENDOSCOPY;  Service: Gastroenterology;;   XI ROBOTIC LAPAROSCOPIC ASSISTED APPENDECTOMY N/A 11/05/2022   Procedure: XI ROBOTIC LAPAROSCOPIC ASSISTED APPENDECTOMY;  Surgeon: Rodolph Romano, MD;  Location: [facility] ORS;  Service: General;  Laterality: N/A;     Allergies[1]  Family History  Problem Relation Age of Onset   Hypertension Mother    Heart disease Mother    Heart failure Maternal Grandmother     Prior to Admission medications  Medication  Sig Start Date End Date Taking? Authorizing Provider  acetaminophen  (TYLENOL ) 650 MG CR tablet Take 650 mg by mouth every 8 (eight) hours as needed for pain.    [provider]  aspirin  81 MG chewable tablet Chew 81 mg by mouth daily.    [provider]  atorvastatin  (LIPITOR) 40 MG tablet Take 40 mg by mouth daily.    [provider]  benzocaine  (ORAJEL) 10 % mucosal gel Apply to the tip of the penis for penile pain up to four times daily prn for penile pain 06/11/24   Helon Kirsch A, PA-C  citalopram  (CELEXA ) 20 MG tablet Take 20 mg by mouth daily.    [provider]  feeding supplement (ENSURE ENLIVE / ENSURE PLUS) LIQD Take 237 mLs by mouth 3 (three) times daily between meals. 10/25/23  Barbarann Nest, MD  furosemide  (LASIX ) 40 MG tablet Take 40 mg by mouth daily.    [provider]  gabapentin  (NEURONTIN ) 300 MG capsule Take 300 mg by mouth 2 (two) times daily. 06/07/23   [provider]  lactulose  (CHRONULAC ) 10 GM/15ML solution Take 45 mLs (30 g total) by mouth 3 (three) times daily. Please titrate to have 2-3 soft bowel movements daily 08/18/24   Wouk, Devaughn Sayres, MD  lidocaine  (XYLOCAINE ) 2 % jelly Place 1 Application into the urethra 3 (three) times daily as needed. 09/13/24   Jacolyn Pae, MD  lidocaine  (XYLOCAINE ) 2 % solution Use as directed 15 mLs in the mouth or throat as needed for mouth pain. 09/18/24   Helon Kirsch A, PA-C  methocarbamol  (ROBAXIN ) 500 MG tablet Take 1 tablet (500 mg total) by mouth every 8 (eight) hours as needed for muscle spasms. 08/05/24   Jerelene Critchley, MD  Multiple Vitamin (MULTIVITAMIN WITH MINERALS) TABS tablet Take 1 tablet by mouth daily.    [provider]  nitroGLYCERIN  (NITROLINGUAL ) 0.4 MG/SPRAY spray Place 1 spray under the tongue every 5 (five) minutes x 3 doses as needed for chest pain. 10/30/23   Donette Ellouise LABOR, FNP  nystatin  powder Apply 1 Application topically 3  (three) times daily.    [provider]  pantoprazole  (PROTONIX ) 40 MG tablet Take 40 mg by mouth daily. 09/24/23   [provider]  phenol (CHLORASEPTIC) 1.4 % LIQD Use as directed 1 spray in the mouth or throat as needed for throat irritation / pain.    [provider]  QUEtiapine  (SEROQUEL ) 50 MG tablet Take 50 mg by mouth at bedtime. 11/19/23   [provider]  senna (SENOKOT) 8.6 MG TABS tablet Take 1 tablet (8.6 mg total) by mouth 2 (two) times daily as needed for mild constipation. 07/02/23   Gregory Edsel Ruth, PA  spironolactone  (ALDACTONE ) 25 MG tablet Take 0.5 tablets (12.5 mg total) by mouth daily. 07/03/24 09/18/24  Al-Sultani, Anmar, MD  VENTOLIN  HFA 108 (90 Base) MCG/ACT inhaler Inhale 2 puffs into the lungs every 4 (four) hours as needed for wheezing. 07/16/23   [provider]  XIFAXAN  550 MG TABS tablet Take 1 tablet (550 mg total) by mouth 2 (two) times daily. Start AFTER finishing vancomycin  08/18/24   Wouk, Devaughn Sayres, MD    Social History:  reports that he has been smoking cigarettes. He has never used smokeless tobacco. He reports that he does not currently use alcohol. He reports that he does not currently use drugs.    Physical Exam: Vitals:   09/24/24 0751 09/24/24 0752 09/24/24 0755  BP:  (!) 158/66 (!) 158/66  Pulse:  (!) 51 (!) 51  Resp:  16 16  Temp:   98 F (36.7 C)  TempSrc:   Oral  SpO2:  98% 98%  Weight: 93 kg    Height: 5' 6 (1.676 m)      Gen: NAD HENT: NCAT, small bump noted on left side of tongue CV: Bradycardic rate, regular rhythm Lung: CTAB Abd: No TTP, normal bowel sounds MSK: No asymmetry, good bulk and tone Neuro: alert and oriented x 2, asterixis present   Labs on Admission: I have personally reviewed following labs and imaging studies  CBC: Recent Labs  Lab 09/24/24 0804  WBC 3.8*  NEUTROABS 2.5  HGB 9.6*  HCT 30.5*  MCV 91.9  PLT 62*   Basic Metabolic Panel: Recent Labs  Lab  09/24/24 0804  NA  143  K 3.5  CL 110  CO2 21*  GLUCOSE 83  BUN 13  CREATININE 1.34*  CALCIUM  9.8   GFR: Estimated Creatinine Clearance: 50.9 mL/min (A) (by C-G formula based on SCr of 1.34 mg/dL (H)). Liver Function Tests: Recent Labs  Lab 09/24/24 0804  AST 32  ALT 14  ALKPHOS 81  BILITOT 1.6*  PROT 7.2  ALBUMIN  3.0*   No results for input(s): LIPASE, AMYLASE in the last 168 hours. Recent Labs  Lab 09/24/24 0818  AMMONIA 144*   Coagulation Profile: Recent Labs  Lab 09/24/24 0804  INR 1.2   Cardiac Enzymes: Recent Labs  Lab 09/24/24 0804  CKTOTAL 78   BNP (last 3 results) Recent Labs    10/21/23 1842 12/30/23 1631  BNP 250.6* 381.2*   HbA1C: No results for input(s): HGBA1C in the last 72 hours. CBG: Recent Labs  Lab 09/24/24 1351  GLUCAP 72   Lipid Profile: No results for input(s): CHOL, HDL, LDLCALC, TRIG, CHOLHDL, LDLDIRECT in the last 72 hours. Thyroid  Function Tests: No results for input(s): TSH, T4TOTAL, FREET4, T3FREE, THYROIDAB in the last 72 hours. Anemia Panel: No results for input(s): VITAMINB12, FOLATE, FERRITIN, TIBC, IRON, RETICCTPCT in the last 72 hours. Urine analysis:    Component Value Date/Time   COLORURINE YELLOW (A) 08/24/2024 0914   APPEARANCEUR CLOUDY (A) 08/24/2024 0914   LABSPEC 1.011 08/24/2024 0914   PHURINE 5.0 08/24/2024 0914   GLUCOSEU NEGATIVE 08/24/2024 0914   HGBUR MODERATE (A) 08/24/2024 0914   BILIRUBINUR NEGATIVE 08/24/2024 0914   KETONESUR NEGATIVE 08/24/2024 0914   PROTEINUR NEGATIVE 08/24/2024 0914   NITRITE NEGATIVE 08/24/2024 0914   LEUKOCYTESUR LARGE (A) 08/24/2024 0914    Radiological Exams on Admission: I have personally reviewed images CT CHEST ABDOMEN PELVIS W CONTRAST Result Date: 09/24/2024 EXAM: CT CHEST WITH CONTRAST 09/24/2024 11:23:08 AM TECHNIQUE: CT of the chest was performed with the administration of 100 mL of iohexol  (OMNIPAQUE ) 300 MG/ML  solution intravenous contrast. Multiplanar reformatted images are provided for review. Automated exposure control, iterative reconstruction, and/or weight based adjustment of the mA/kV was utilized to reduce the radiation dose to as low as reasonably achievable. COMPARISON: None available. CLINICAL HISTORY: Polytrauma, blunt. FINDINGS: MEDIASTINUM: small Pericardial effusion. Cardiac motion artifact at the root of the aorta and ascending aorta. No Mediastinal hematoma. No aortic injury. The central airways are clear. LYMPH NODES: No mediastinal, hilar or axillary lymphadenopathy. LUNGS AND PLEURA: Emphysema and degenerative changes. No focal consolidation or pulmonary edema. No pleural effusion or pneumothorax. SOFT TISSUES/BONES: NO thoracic fracture. Parenchymal deformity in the proximal sternum seen on sagittal images is favored a remote healed fracture. No Pelvic fracture or spine fracture. Artifact through the abdomen and pelvis related to arms and side. No acute abnormality of the soft tissues. UPPER ABDOMEN: Nodular contour of the liver. No hepatic laceration. No splenic laceration. Kidneys enhance symmetrically. Bladder is collapsed from a foley catheter. No evidence of bowel injury. No mesenteric fluid. Artifact through the abdomen and pelvis related to arms and side. IMPRESSION: 1. NO  evidence of aortic injury. Small pericardial effusion 2. No pneumothorax or pleural effusion. 3. Emphysema; pulmonary emphysema is an independent risk factor for lung cancer. Recommend consideration for evaluation for a low-dose CT lung cancer screening program. 4. Remote sternal fracture. Electronically signed by: Norleen Boxer MD 09/24/2024 12:50 PM EST RP Workstation: [id number]   CT HEAD WO CONTRAST ( ) Result Date: 09/24/2024 EXAM: CT HEAD WITHOUT CONTRAST 09/24/2024 11:23:08 AM TECHNIQUE: CT of the  head was performed without the administration of intravenous contrast. Automated exposure control, iterative  reconstruction, and/or weight based adjustment of the mA/kV was utilized to reduce the radiation dose to as low as reasonably achievable. COMPARISON: 07/31/2024 CLINICAL HISTORY: Head trauma, minor (Age >= 65y). FINDINGS: BRAIN AND VENTRICLES: No acute hemorrhage. No evidence of acute infarct. No hydrocephalus. No extra-axial collection. No mass effect or midline shift. Mild chronic microvascular ischemic changes. There is mild atherosclerosis of the carotid siphons. ORBITS: No acute abnormality. SINUSES: Postsurgical changes of the maxillary and ethmoid sinuses. SOFT TISSUES AND SKULL: No acute soft tissue abnormality. No skull fracture. IMPRESSION: 1. No acute intracranial abnormality. Electronically signed by: Donnice Mania MD 09/24/2024 11:49 AM EST RP Workstation: [id number]   CT T-SPINE NO CHARGE Result Date: 09/24/2024 EXAM: CT THORACIC SPINE WITHOUT CONTRAST 09/24/2024 11:23:08 AM TECHNIQUE: CT of the thoracic spine was performed without the administration of intravenous contrast. Multiplanar reformatted images are provided for review. Automated exposure control, iterative reconstruction, and/or weight based adjustment of the mA/kV was utilized to reduce the radiation dose to as low as reasonably achievable. COMPARISON: CT thoracic spine 08/12/2022. CLINICAL HISTORY: FINDINGS: BONES AND ALIGNMENT: Normal vertebral body heights. No traumatic malalignment of the thoracic spine. No compression fracture or displaced fracture in the thoracic spine. No suspicious bone lesion. Chronic appearing deformity of the sternum. DEGENERATIVE CHANGES: There is disc space narrowing at multiple levels. Degenerative endplate osteophytes, particularly in the mid and lower thoracic spine. SOFT TISSUES: No acute abnormality. IMPRESSION: 1. No acute abnormality of the thoracic spine. Electronically signed by: Donnice Mania MD 09/24/2024 11:48 AM EST RP Workstation: [id number]   CT L-SPINE NO CHARGE Result Date: 09/24/2024 EXAM:  CT OF THE LUMBAR SPINE WITHOUT CONTRAST 09/24/2024 11:23:08 AM TECHNIQUE: CT of the lumbar spine was performed without the administration of intravenous contrast. Multiplanar reformatted images are provided for review. Automated exposure control, iterative reconstruction, and/or weight based adjustment of the mA/kV was utilized to reduce the radiation dose to as low as reasonably achievable. COMPARISON: Compared to 07/18/2024. CLINICAL HISTORY: FINDINGS: BONES AND ALIGNMENT: Normal vertebral body heights. No evidence of traumatic malalignment. No compression fracture or displaced fracture in the lumbar spine. Normal alignment. DEGENERATIVE CHANGES: Mild disc space narrowing at L3-L4. Mild degenerative endplate osteophytes at multiple levels. There is no high grade osseous spinal canal stenosis. Similar appearance of grade 1 degenerative anterolisthesis of L4 on L5. Facet arthrosis throughout the lumbar spine, greatest at L4 on L5. SOFT TISSUES: Right upper quadrant surgical clips. IMPRESSION: 1. No evidence of acute traumatic injury. Electronically signed by: Donnice Mania MD 09/24/2024 11:37 AM EST RP Workstation: [id number]   CT Cervical Spine Wo Contrast Result Date: 09/24/2024 EXAM: CT CERVICAL SPINE WITHOUT CONTRAST 09/24/2024 11:23:08 AM TECHNIQUE: CT of the cervical spine was performed without the administration of intravenous contrast. Multiplanar reformatted images are provided for review. Automated exposure control, iterative reconstruction, and/or weight based adjustment of the mA/kV was utilized to reduce the radiation dose to as low as reasonably achievable. COMPARISON: 06/17/2023 CLINICAL HISTORY: Neck trauma (Age >= 65y). Neck trauma. Age >= 65y. FINDINGS: BONES AND ALIGNMENT: Alignment is maintained. Interval anterior cervical fusion spanning C3 to C6 with intact hardware. Solid osseous fusion is noted from C3 to C7. No acute fracture or traumatic malalignment. DEGENERATIVE CHANGES: There is mild  disc space narrowing at C2-C3 and C7-T1. No high grade osseous spinal canal stenosis. Facet arthrosis and uncovertebral hypertrophy are present at multiple levels. There is significant foraminal stenosis at multiple  levels, in particular at C5-C6. SOFT TISSUES: No prevertebral soft tissue swelling. IMPRESSION: 1. No evidence of acute traumatic injury. 2. Anterior cervical fusion spanning C3 to C6 with solid osseous fusion from C3 to C7. Hardware is intact. 3. Significant foraminal stenosis at multiple levels, particularly at C5-C6. Electronically signed by: Donnice Mania MD 09/24/2024 11:29 AM EST RP Workstation: [id number]   DG Tibia/Fibula Right Result Date: 09/24/2024 EXAM: 2 VIEW(S) XRAY OF THE RIGHT TIBIA AND FIBULA 09/24/2024 08:42:00 AM COMPARISON: None available. CLINICAL HISTORY: fall FINDINGS: BONES AND JOINTS: The tibia and fibula are intact. No acute fracture. No malalignment. There is mild osteoarthritis of the right medial knee compartment. There is calcaneal spurring present. SOFT TISSUES: Unremarkable. IMPRESSION: 1. No acute fracture or dislocation of the right tibia or fibula. 2. Mild osteoarthritis of the right medial knee compartment. 3. Calcaneal spurring. Electronically signed by: Evalene Coho MD 09/24/2024 08:54 AM EST RP Workstation: [id number]   DG Tibia/Fibula Left Result Date: 09/24/2024 EXAM: 1 VIEW(S) XRAY OF THE LEFT TIBIA AND FIBULA 09/24/2024 08:42:00 AM COMPARISON: None available. CLINICAL HISTORY: Fall. FINDINGS: BONES AND JOINTS: The tibia and fibula are intact. No malalignment. There is mild osteoarthritis of the medial knee compartment. Spurring is present on the plantar surface of the calcaneus and at the insertion site of the Achilles tendon. SOFT TISSUES: There is diffuse subcutaneous soft tissue edema and soft tissue thickening. IMPRESSION: 1. No acute fracture or dislocation. 2. Mild osteoarthritis of the medial knee compartment. 3. Plantar calcaneal spur and  enthesophyte at the Achilles tendon insertion. 4. Diffuse subcutaneous soft tissue edema. Electronically signed by: Evalene Coho MD 09/24/2024 08:52 AM EST RP Workstation: [id number]   DG Chest Portable 1 View Result Date: 09/24/2024 EXAM: 1 VIEW(S) XRAY OF THE CHEST 09/24/2024 08:42:00 AM COMPARISON: 07/31/2024 CLINICAL HISTORY: Fall. FINDINGS: LUNGS AND PLEURA: No focal pulmonary opacity. No pleural effusion. No pneumothorax. HEART AND MEDIASTINUM: Persistent enlarged cardiomediastinal silhouette. Atherosclerotic plaque. BONES AND SOFT TISSUES: Cervical spine surgical hardware noted. Right upper quadrant surgical clips noted. No acute osseous abnormality. IMPRESSION: 1. No acute cardiopulmonary abnormality. 2. Persistent enlarged cardiomediastinal silhouette with atherosclerotic calcifications. 3. Cervical spine surgical hardware and right upper quadrant surgical clips. Electronically signed by: Evalene Coho MD 09/24/2024 08:50 AM EST RP Workstation: [id number]   DG Pelvis Portable Result Date: 09/24/2024 EXAM: 1 or 2 view(s) Xray of the pelvis 09/24/2024 08:42:00 AM COMPARISON: None available. CLINICAL HISTORY: Fall. Patient fell. FINDINGS: BONES AND JOINTS: No acute fracture. No malalignment. SOFT TISSUES: Unremarkable. IMPRESSION: 1. No significant abnormality. Electronically signed by: Evalene Coho MD 09/24/2024 08:49 AM EST RP Workstation: [id number]    EKG: My personal interpretation of EKG shows: Pending    Assessment/Plan Principal Problem:   Hepatic encephalopathy (HCC) Active Problems:   Liver cirrhosis (HCC)   Diabetes mellitus type 2, noninsulin dependent (HCC)   Essential hypertension   Hyperlipidemia   MDD (major depressive disorder), recurrent severe, without psychosis (HCC)   Thrombocytopenia   Chronic kidney disease, stage 3a (HCC)   COPD (chronic obstructive pulmonary disease) (HCC)   Obesity (BMI 30-39.9)   Peripheral neuropathy   GERD without esophagitis    Patient with developing confusion and rising ammonia level admitted for hepatic encephalopathy.  Per group home staff patient has been refusing his medications specially his lactulose .  Will restart lactulose  and titrate for 3 bowel movements.  Continue home rifaximin .  Will monitor his ammonia level.  Hepatic cirrhosis: MELD sodium score 13.  Child-Pugh class B.  Decompensated given  encephalopathy.  Last follow-up with GI in 02/2024.  Plan was for routine follow-ups for [facility] screening.  Continue Lasix  and spironolactone .  Type 2 diabetes: A1c within normal limits but has A1c greater than 7 previously.  Start SSI.  Hypertension: Continue home Lasix  40 mg, spironolactone  25 mg.  If heart rate allows, he will benefit from addition of beta-blocker given his history of cirrhosis.  Hyperlipidemia: Continue home statin.  Normal LFT noted.  CKD 3A: Chronic and at baseline.  Renally dose medications.  Avoid nephrotoxic agents.  Thrombocytopenia: Likely secondary to cirrhosis.  62K currently.  Will continue to monitor.  No signs of bleeding.  IPF and smear ordered. On SCD for DVT ppx but if continue to be stable can start pharmacologic DVT ppx tomorrow.   Chronic urinary retention: s/p foley placement. Pt seen by urology with plan for suprapubic catheter placement. Last exchanged 09/19/24.    CHF: Appears to be euvolemic on exam.  Continue to monitor.  Continue diuretics for volume management.  COPD: Continue home regimen.  Continue as needed DuoNebs.  GERD: Continue home PPI  MDD: Continue home regimen   VTE prophylaxis:  SCDs  Diet: NPO Code Status:  Full Code Telemetry:  Admission status: Observation, Med-Surg Patient is from: Home Anticipated d/c is to: Home Anticipated d/c is in: 1-2 days   Family Communication: Updated at bedside  Consults called: None   Severity of Illness: The appropriate patient status for this patient is OBSERVATION. Observation status is judged to be  reasonable and necessary in order to provide the required intensity of service to ensure the patient's safety. The patient's presenting symptoms, physical exam findings, and initial radiographic and laboratory data in the context of their medical condition is felt to place them at decreased risk for further clinical deterioration. Furthermore, it is anticipated that the patient will be medically stable for discharge from the hospital within 2 midnights of admission.    Morene Bathe, MD [place] DEL. [facility]     [1]  Allergies Allergen Reactions   Bee Venom Anaphylaxis   Codeine  Itching    Only itching. Recently allergy tested at [facility] Pre-F Virus Vaccine Swelling   Meloxicam     Other Reaction(s): ?overdose of local given at dentist--had trouble breathing   Novocain [Procaine]    Sulfa Antibiotics     hallucinations   "

## 2024-09-24 NOTE — ED Notes (Signed)
 Pt incontinent of bowel. Pt cleaned and placed in fresh brief. Clean linens on bed. Pt repositioned. Stretcher locker in lowest position and call bell within reach. No other needs at this time.

## 2024-09-24 NOTE — ED Triage Notes (Signed)
 Pt to ED via [facility] from [facility] for c/o fall. Pt states weakness x 2 days. Upon EMS arrival, cbg 51. Pt given oral glucose and cbg improved to 85. Pt denies hitting head, no LOC, no thinners.

## 2024-09-25 DIAGNOSIS — K7460 Unspecified cirrhosis of liver: Secondary | ICD-10-CM

## 2024-09-25 DIAGNOSIS — F332 Major depressive disorder, 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 psychotic features: Secondary | ICD-10-CM

## 2024-09-25 DIAGNOSIS — J439 Emphysema, unspecified: Secondary | ICD-10-CM

## 2024-09-25 DIAGNOSIS — E782 Mixed hyperlipidemia: Secondary | ICD-10-CM | POA: Diagnosis not present

## 2024-09-25 DIAGNOSIS — Z87898 Personal history of other specified conditions: Secondary | ICD-10-CM

## 2024-09-25 DIAGNOSIS — N1831 Chronic kidney disease, stage 3a: Secondary | ICD-10-CM | POA: Diagnosis not present

## 2024-09-25 DIAGNOSIS — I10 Essential (primary) hypertension: Secondary | ICD-10-CM | POA: Diagnosis not present

## 2024-09-25 DIAGNOSIS — K219 Gastro-esophageal reflux disease without esophagitis: Secondary | ICD-10-CM

## 2024-09-25 DIAGNOSIS — K7682 Hepatic encephalopathy: Secondary | ICD-10-CM | POA: Diagnosis not present

## 2024-09-25 DIAGNOSIS — E669 Obesity, unspecified: Secondary | ICD-10-CM | POA: Diagnosis not present

## 2024-09-25 DIAGNOSIS — E119 Type 2 diabetes mellitus without complications: Secondary | ICD-10-CM | POA: Diagnosis not present

## 2024-09-25 DIAGNOSIS — R296 Repeated falls: Secondary | ICD-10-CM

## 2024-09-25 DIAGNOSIS — D696 Thrombocytopenia, unspecified: Secondary | ICD-10-CM | POA: Diagnosis not present

## 2024-09-25 DIAGNOSIS — E876 Hypokalemia: Secondary | ICD-10-CM

## 2024-09-25 LAB — CBC
HCT: 24.4 % — ABNORMAL LOW (ref 39.0–52.0)
Hemoglobin: 7.8 g/dL — ABNORMAL LOW (ref 13.0–17.0)
MCH: 29.5 pg (ref 26.0–34.0)
MCHC: 32 g/dL (ref 30.0–36.0)
MCV: 92.4 fL (ref 80.0–100.0)
Platelets: 72 10*3/uL — ABNORMAL LOW (ref 150–400)
RBC: 2.64 MIL/uL — ABNORMAL LOW (ref 4.22–5.81)
RDW: 16.1 % — ABNORMAL HIGH (ref 11.5–15.5)
WBC: 2.9 10*3/uL — ABNORMAL LOW (ref 4.0–10.5)
nRBC: 0 % (ref 0.0–0.2)

## 2024-09-25 LAB — COMPREHENSIVE METABOLIC PANEL WITH GFR
ALT: 12 U/L (ref 0–44)
AST: 27 U/L (ref 15–41)
Albumin: 2.6 g/dL — ABNORMAL LOW (ref 3.5–5.0)
Alkaline Phosphatase: 69 U/L (ref 38–126)
Anion gap: 5 (ref 5–15)
BUN: 13 mg/dL (ref 8–23)
CO2: 25 mmol/L (ref 22–32)
Calcium: 9.6 mg/dL (ref 8.9–10.3)
Chloride: 110 mmol/L (ref 98–111)
Creatinine, Ser: 1.31 mg/dL — ABNORMAL HIGH (ref 0.61–1.24)
GFR, Estimated: 57 mL/min — ABNORMAL LOW
Glucose, Bld: 72 mg/dL (ref 70–99)
Potassium: 3.2 mmol/L — ABNORMAL LOW (ref 3.5–5.1)
Sodium: 140 mmol/L (ref 135–145)
Total Bilirubin: 1.3 mg/dL — ABNORMAL HIGH (ref 0.0–1.2)
Total Protein: 6.3 g/dL — ABNORMAL LOW (ref 6.5–8.1)

## 2024-09-25 LAB — GLUCOSE, CAPILLARY
Glucose-Capillary: 63 mg/dL — ABNORMAL LOW (ref 70–99)
Glucose-Capillary: 106 mg/dL — ABNORMAL HIGH (ref 70–99)
Glucose-Capillary: 125 mg/dL — ABNORMAL HIGH (ref 70–99)
Glucose-Capillary: 108 mg/dL — ABNORMAL HIGH (ref 70–99)

## 2024-09-25 LAB — AMMONIA: Ammonia: 57 umol/L — ABNORMAL HIGH (ref 9–35)

## 2024-09-25 LAB — MAGNESIUM: Magnesium: 1.5 mg/dL — ABNORMAL LOW (ref 1.7–2.4)

## 2024-09-25 MED ORDER — ADULT MULTIVITAMIN W/MINERALS CH
1.0000 | ORAL_TABLET | Freq: Every day | ORAL | Status: DC
  Administered 2024-09-25 – 2024-09-29 (×5): 1 via ORAL
  Filled 2024-09-25 (×5): qty 1

## 2024-09-25 MED ORDER — HYDROMORPHONE HCL 1 MG/ML IJ SOLN
0.5000 mg | Freq: Once | INTRAMUSCULAR | Status: AC
  Administered 2024-09-25: 0.5 mg via INTRAVENOUS
  Filled 2024-09-25: qty 0.5

## 2024-09-25 MED ORDER — POTASSIUM CHLORIDE CRYS ER 20 MEQ PO TBCR
40.0000 meq | EXTENDED_RELEASE_TABLET | Freq: Once | ORAL | Status: AC
  Administered 2024-09-25: 40 meq via ORAL
  Filled 2024-09-25 (×2): qty 2

## 2024-09-25 MED ORDER — MAGNESIUM SULFATE 4 GM/100ML IV SOLN
4.0000 g | Freq: Once | INTRAVENOUS | Status: AC
  Administered 2024-09-25: 4 g via INTRAVENOUS
  Filled 2024-09-25: qty 100

## 2024-09-25 MED ORDER — ENSURE ENLIVE PO LIQD
237.0000 mL | Freq: Three times a day (TID) | ORAL | Status: DC
  Administered 2024-09-25 – 2024-09-29 (×8): 237 mL via ORAL
  Filled 2024-09-25: qty 237

## 2024-09-25 NOTE — Assessment & Plan Note (Signed)
-   Holding home Lipitor due to some worsening in liver function which is now started improving. - Monitor liver function and we will restart if continue to improve

## 2024-09-25 NOTE — Assessment & Plan Note (Signed)
 Patient has a chronic Foley in place, seen by urologist and plan for suprapubic catheter placement.  Last exchange was on 09/19/2024. - Outpatient follow-up with urology

## 2024-09-25 NOTE — NC FL2 (Signed)
 " [place]  MEDICAID FL2 LEVEL OF CARE FORM     IDENTIFICATION  Patient Name: [name] Birthdate: 04-16-1951 Sex: male Admission Date (Current Location): 09/24/2024  [place] and [place] Number:  Chiropodist and Address:  [facility], [address], [place], [place] [address]      Provider Number: [id number]  Attending Physician Name and Address:  Caleen Qualia, MD  Relative Name and Phone Number:       Current Level of Care: Hospital Recommended Level of Care: Skilled Nursing Facility Prior Approval Number:    Date Approved/Denied:   PASRR Number: [id number]  Discharge Plan: SNF    Current Diagnoses: Patient Active Problem List   Diagnosis Date Noted   History of urinary retention 09/25/2024   Falls 09/25/2024   GI bleeding 08/16/2024   C. difficile colitis 08/16/2024   Iron deficiency anemia 07/03/2024   Gram-negative bacteremia 06/30/2024   Sepsis due to Gram-negative organism with acute renal failure (HCC) 06/27/2024   Hyponatremia 06/27/2024   Acute on chronic anemia 06/27/2024   Dyslipidemia 06/27/2024   Peripheral neuropathy 06/27/2024   GERD without esophagitis 06/27/2024   Bacteremia 02/18/2024   Complicated UTI (urinary tract infection) 02/17/2024   Chronic indwelling Foley catheter 01/03/2024   Hypomagnesemia 12/30/2023   Depression with anxiety 12/30/2023   Altered mental status 11/12/2023   Chronic kidney disease, stage 3a (HCC) 10/21/2023   Myocardial injury 10/21/2023   CKD stage 3a, GFR 45-59 ml/min (HCC) - baseline scr 1.3 10/01/2023   Hepatic encephalopathy (HCC) 09/30/2023   Liver cirrhosis (HCC) 09/30/2023   Essential hypertension 09/30/2023   Diabetes mellitus type 2, noninsulin dependent (HCC) 09/30/2023   CAD (coronary artery disease) 09/30/2023   Hyperlipidemia 09/30/2023   Hepatic encephalopathy (HCC) 08/01/2023   Hyperammonemia 07/30/2023   Cervical spinal stenosis 07/01/2023   Cervical  myelopathy (HCC) 06/21/2023   Cervical stenosis of spine 06/21/2023   Cervical radiculopathy 06/20/2023   E coli bacteremia 06/17/2023   UTI (urinary tract infection) 06/17/2023   Left leg cellulitis 03/23/2023   Leukocytosis 03/23/2023   Left leg pain 03/23/2023   Suicidal ideation 01/14/2023   Acute pain 01/14/2023   Adrenal insufficiency 12/26/2022   Orthostatic hypotension 12/24/2022   Rib pain on right side 12/24/2022   Hepatitis B infection 12/24/2022   Myocardial ischemia 12/24/2022   Acute appendicitis 11/05/2022   Pancytopenia (HCC) 11/05/2022   Sinus bradycardia 11/05/2022   Acute metabolic encephalopathy 09/22/2022   Liver lesion 09/22/2022   Obesity (BMI 30-39.9) 09/22/2022   Acute hepatic encephalopathy (HCC) 09/22/2022   MDD (major depressive disorder), recurrent severe, 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   Major depressive disorder, 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 psychotic features (HCC) 09/15/2022   Protein-calorie malnutrition, severe 08/31/2022   Hypotension 08/31/2022   Weakness    Hx of transient ischemic attack (TIA) 04/28/2022   Obesity    Chronic diastolic CHF (congestive heart failure) (HCC)    Depression    Septic shock (HCC) 03/28/2022   Bacteremia due to Klebsiella pneumoniae 03/28/2022   Hypoglycemia 03/28/2022   Syncope 03/24/2022   Hypokalemia 03/24/2022   Fever 03/24/2022   COPD (chronic obstructive pulmonary disease) (HCC)    Elevated troponin    Abnormal LFTs    History of hepatitis C    AKI (acute kidney injury)    Pure hypercholesterolemia    Obesity, Class III, BMI 40-49.9 (morbid obesity) (HCC) 04/18/2020   Thrombocytopenia 04/17/2020   Leukopenia 04/17/2020   HTN (hypertension) 04/17/2018  Uncontrolled type 2 diabetes mellitus with hyperglycemia, without long-term current use of insulin  (HCC) 04/17/2018   Lymphedema 04/17/2018   Normocytic anemia 04/07/2018   Chest pain 03/20/2018   Unsheltered homelessness 12/16/2017     Orientation RESPIRATION BLADDER Height & Weight     Self, Situation, Place  Normal Incontinent, Indwelling catheter Weight: 245 lb 13 oz (111.5 kg) Height:  5' 6 (167.6 cm)  BEHAVIORAL SYMPTOMS/MOOD NEUROLOGICAL BOWEL NUTRITION STATUS   (None)  (None) Continent Diet (Heart healthy/carb modified)  AMBULATORY STATUS COMMUNICATION OF NEEDS Skin   Extensive Assist Verbally Bruising                       Personal Care Assistance Level of Assistance  Bathing, Feeding, Dressing Bathing Assistance: Maximum assistance Feeding assistance: Limited assistance Dressing Assistance: Maximum assistance     Functional Limitations Info  Sight, Speech, Hearing Sight Info: Adequate Hearing Info: Adequate Speech Info: Adequate    SPECIAL CARE FACTORS FREQUENCY  PT (By licensed PT), OT (By licensed OT)     PT Frequency: 5 x week OT Frequency: 5 x week            Contractures Contractures Info: Not present    Additional Factors Info  Code Status, Allergies, Isolation Precautions Code Status Info: Full code Allergies Info: Bee Venom, Codeine , Rsv Mrna Pre-f Virus Vaccine, Meloxicam, Novocain (Procaine), Sulfa Antibiotics     Isolation Precautions Info: Contact: VRE     Current Medications (09/25/2024):  This is the current hospital active medication list Current Facility-Administered Medications  Medication Dose Route Frequency Provider Last Rate Last Admin   acetaminophen  (TYLENOL ) tablet 650 mg  650 mg Oral Q6H PRN Khan, Ghalib, MD   650 mg at 09/25/24 1343   Or   acetaminophen  (TYLENOL ) suppository 650 mg  650 mg Rectal Q6H PRN Khan, Ghalib, MD       aspirin  chewable tablet 81 mg  81 mg Oral Daily Fernand Prost, MD   81 mg at 09/25/24 9157   citalopram  (CELEXA ) tablet 20 mg  20 mg Oral Daily Khan, Ghalib, MD   20 mg at 09/25/24 9157   feeding supplement (ENSURE ENLIVE / ENSURE PLUS) liquid 237 mL  237 mL Oral TID BM Amin, Sumayya, MD       furosemide  (LASIX ) tablet 40 mg   40 mg Oral Daily Khan, Ghalib, MD   40 mg at 09/25/24 9157   gabapentin  (NEURONTIN ) capsule 300 mg  300 mg Oral BID Fernand Prost, MD   300 mg at 09/25/24 9157   insulin  aspart (novoLOG ) injection 0-6 Units  0-6 Units Subcutaneous TID WC Fernand Prost, MD       lactulose  (CHRONULAC ) 10 GM/15ML solution 30 g  30 g Oral TID Khan, Ghalib, MD   30 g at 09/25/24 0841   lidocaine  (LIDODERM ) 5 % 1 patch  1 patch Transdermal Q24H Ernest Ronal BRAVO, MD   1 patch at 09/25/24 9156   multivitamin with minerals tablet 1 tablet  1 tablet Oral Daily Caleen Qualia, MD       ondansetron  (ZOFRAN ) tablet 4 mg  4 mg Oral Q6H PRN Fernand Prost, MD       Or   ondansetron  (ZOFRAN ) injection 4 mg  4 mg Intravenous Q6H PRN Fernand Prost, MD       pantoprazole  (PROTONIX ) EC tablet 40 mg  40 mg Oral Daily Khan, Ghalib, MD   40 mg at 09/25/24 9157   QUEtiapine  (SEROQUEL ) tablet 50  mg  50 mg Oral QHS Khan, Ghalib, MD   50 mg at 09/24/24 2209   rifaximin  (XIFAXAN ) tablet 550 mg  550 mg Oral BID Khan, Ghalib, MD   550 mg at 09/25/24 9157   senna-docusate (Senokot-S) tablet 1 tablet  1 tablet Oral QHS PRN Fernand Prost, MD       sodium chloride  flush (NS) 0.9 % injection 3 mL  3 mL Intravenous Q12H Khan, Ghalib, MD   3 mL at 09/25/24 9156   spironolactone  (ALDACTONE ) tablet 12.5 mg  12.5 mg Oral Daily Khan, Ghalib, MD   12.5 mg at 09/25/24 9157     Discharge Medications: Please see discharge summary for a list of discharge medications.  Relevant Imaging Results:  Relevant Lab Results:   Additional Information SS#: [id number]  Lauraine JAYSON Carpen, LCSW     "

## 2024-09-25 NOTE — Assessment & Plan Note (Signed)
-   Continue home Celexa  and Seroquel

## 2024-09-25 NOTE — Assessment & Plan Note (Signed)
 Seems chronic, likely secondary to liver disease.  Platelets at 72 today -Continue to monitor

## 2024-09-25 NOTE — Hospital Course (Addendum)
 Partly taken from H&P.  [name] is a 74 y.o. year old male with medical history of hypertension, hyperlipidemia, type 2 diabetes, decompensated hepatic cirrhosis, hepatitis C infection, COPD, CHF (EF 55% in 06/2024) presenting to the ED after a fall.  Patient states there was water on the floor and he slipped and fell.  Patient lives in a group home and states they provide medications and assist him with his ADLs.  He states he has not taken his lactulose  over the last week.   On presentation hemodynamically stable.  Labs with chronic pancytopenia with some improvement of hemoglobin from previous and slight worsening of thrombocytopenia.  CMP with mild hypoalbuminemia and hyperbilirubinemia.  proBNP mildly elevated at 764 which is close to his baseline.  Ammonia elevated at 144, it seems like slowly uptrending as it was 132 11 days ago when he was in ED. CT head was negative for any acute abnormality. CT trauma scan was negative for any acute injury.  Patient was admitted for concern of developing hepatic encephalopathy with increasing ammonia level and not taking lactulose .  He was restarted on lactulose  with a goal of 3 bowel movements daily.  1/30: Vital stable, ammonia level improved to 57, mild hypokalemia with potassium at 3.2, small improvement in T. bili to 1.3.  CBC with pancytopenia with some decrease in hemoglobin without any active bleeding.  Replacing potassium. PT is recommending short-term rehab.  1/31: Vitals and labs stable.  Mentation at baseline, awaiting SNF placement.  2/1: Medically stable, awaiting placement.

## 2024-09-25 NOTE — Assessment & Plan Note (Signed)
 No concern of exacerbation at this time -As needed bronchodilator

## 2024-09-25 NOTE — Assessment & Plan Note (Signed)
PT is recommending short-term rehab.

## 2024-09-25 NOTE — Assessment & Plan Note (Signed)
 Estimated body mass index is 39.68 kg/m as calculated from the following:   Height as of this encounter: 5' 6 (1.676 m).   Weight as of this encounter: 111.5 kg.   - Patient with class II obesity - Encouraged weight loss

## 2024-09-25 NOTE — Assessment & Plan Note (Signed)
 Intermittent episodes of mild hypoglycemia. - Continue with very sensitive SSI

## 2024-09-25 NOTE — Progress Notes (Signed)
 Patient states he is not receiving his lactulose  at the facility because it is not being offered to him.

## 2024-09-25 NOTE — TOC Progression Note (Signed)
 Transition of Care [facility]) - Progression Note    Patient Details  Name: [name] MRN: [id number] Date of Birth: 06/07/1951  Transition of Care [facility]) CM/SW Contact  Daved JONETTA Hamilton, RN Phone Number: 09/25/2024, 4:39 PM  Clinical Narrative:     12:03pm- Contacted Care Home, spoke with [name], discussed patient medically ready for discharge, patient is declining SNF-STR. [name] advised that patient's current needs are more than what is available at Care Home and patient is not safe to return at his current level of function.   This CM met with patient, introduced self and explained role. Discussed with patient therapy recommendations for rehab. Patient verbalized he didn't want to go to one of those places. Discussed with patient his current level of function and needs not being able to be met at the Care Home and that rehab is to help him get back to his PLOF so that he can return back to Care Home. Patient verbalized understanding and agreement to rehab. Patient verbalized he wanted to be close to Care Home. This CM reviewed local facility addresses and patient verbalized [facility] and [facility] as choice.   TOC SW sent FL2 for signature and sent referrals to patients choices.  Per TOC chart review, documentation from prior hospital encounters indicate patient may possibly have DSS guardian. This CM spoke with Sari Kerns DSS, Sari advised that patient is not under guardianship however, she has been in contact with [name] at [facility] and provided referral for him to fill out to request DSS begin guardianship process and she will follow up with [facility] after our call.                        Expected Discharge Plan and Services                                               Social Drivers of Health (SDOH) Interventions SDOH Screenings   Food Insecurity: No Food Insecurity (09/25/2024)  Housing: Low Risk (09/25/2024)  Transportation Needs: No  Transportation Needs (09/25/2024)  Utilities: At Risk (09/25/2024)  Alcohol Screen: Low Risk (09/24/2022)  Depression (PHQ2-9): High Risk (04/26/2022)  Financial Resource Strain: High Risk (04/27/2022)  Social Connections: Socially Isolated (09/25/2024)  Tobacco Use: High Risk (09/24/2024)    Readmission Risk Interventions    08/17/2024    7:19 PM 11/29/2023    5:03 PM 11/13/2023    2:21 PM  Readmission Risk Prevention Plan  Transportation Screening Complete Not Complete   Medication Review (RN Care Manager) Complete Not Complete Complete  PCP or Specialist appointment within 3-5 days of discharge  Not Complete Complete  HRI or Home Care Consult Complete Not Complete Complete  SW Recovery Care/Counseling Consult  Not Complete Complete  Palliative Care Screening Not Applicable Not Applicable Complete  Skilled Nursing Facility Not Applicable Not Applicable Not Applicable

## 2024-09-25 NOTE — TOC CM/SW Note (Signed)
"   Existing PASRR [id number]  "

## 2024-09-25 NOTE — Assessment & Plan Note (Signed)
 Blood pressure mildly elevated. -Continue home Lasix  and spironolactone 

## 2024-09-25 NOTE — Assessment & Plan Note (Signed)
 Continue home PPI

## 2024-09-25 NOTE — Plan of Care (Signed)

## 2024-09-25 NOTE — Assessment & Plan Note (Signed)
Renal function at baseline -Monitor renal function -Avoid nephrotoxins

## 2024-09-25 NOTE — Assessment & Plan Note (Addendum)
 Hepatic cirrhosis. MELD sodium score 13.  Child-Pugh class B.  Concern of hepatic encephalopathy with falls.  Significantly elevated ammonia and he has stopped taking lactulose .  Ammonia with significant improvement after restarting lactulose .  Patient was educated again about the importance of taking lactulose  and titrating to have 2-3 soft bowel movements daily. -Continue home Lasix  and spironolactone  -Continue rifaximin 

## 2024-09-25 NOTE — Assessment & Plan Note (Signed)
 Magnesium  1.5. - Replace magnesium  and monitor

## 2024-09-25 NOTE — Progress Notes (Signed)
 " Progress Note   Patient: [name] FMW:[id number] DOB: [birth year] DOA: 09/24/2024     0 DOS: the patient was seen and examined on 09/25/2024   Brief hospital course: Partly taken from H&P.  immy ZESHAN [name] is a 74 y.o. year old male with medical history of hypertension, hyperlipidemia, type 2 diabetes, decompensated hepatic cirrhosis, hepatitis C infection, COPD, CHF (EF 55% in 06/2024) presenting to the ED after a fall.  Patient states there was water on the floor and he slipped and fell.  Patient lives in a group home and states they provide medications and assist him with his ADLs.  He states he has not taken his lactulose  over the last week.   On presentation hemodynamically stable.  Labs with chronic pancytopenia with some improvement of hemoglobin from previous and slight worsening of thrombocytopenia.  CMP with mild hypoalbuminemia and hyperbilirubinemia.  proBNP mildly elevated at 764 which is close to his baseline.  Ammonia elevated at 144, it seems like slowly uptrending as it was 132 11 days ago when he was in ED. CT head was negative for any acute abnormality. CT trauma scan was negative for any acute injury.  Patient was admitted for concern of developing hepatic encephalopathy with increasing ammonia level and not taking lactulose .  He was restarted on lactulose  with a goal of 3 bowel movements daily.  1/30: Vital stable, ammonia level improved to 57, mild hypokalemia with potassium at 3.2, small improvement in T. bili to 1.3.  CBC with pancytopenia with some decrease in hemoglobin without any active bleeding.  Replacing potassium. PT is recommending short-term rehab.  Assessment and Plan: * Hepatic encephalopathy (HCC) Hepatic cirrhosis. MELD sodium score 13.  Child-Pugh class B.  Concern of hepatic encephalopathy with falls.  Significantly elevated ammonia and he has stopped taking lactulose .  Ammonia with significant improvement after restarting lactulose .  Patient was  educated again about the importance of taking lactulose  and titrating to have 2-3 soft bowel movements daily. -Continue home Lasix  and spironolactone  -Continue rifaximin   Essential hypertension Blood pressure mildly elevated. -Continue home Lasix  and spironolactone   Hyperlipidemia - Holding home Lipitor due to some worsening in liver function which is now started improving. - Monitor liver function and we will restart if continue to improve  Diabetes mellitus type 2, noninsulin dependent (HCC) Intermittent episodes of mild hypoglycemia. - Continue with very sensitive SSI  MDD (major depressive disorder), recurrent severe, without psychosis (HCC) - Continue home Celexa  and Seroquel   Chronic kidney disease, stage 3a (HCC) Renal function at baseline. -Monitor renal function -Avoid nephrotoxins  Thrombocytopenia Seems chronic, likely secondary to liver disease.  Platelets at 72 today -Continue to monitor  COPD (chronic obstructive pulmonary disease) (HCC) No concern of exacerbation at this time. - As needed bronchodilator  Obesity (BMI 30-39.9) Estimated body mass index is 39.68 kg/m as calculated from the following:   Height as of this encounter: 5' 6 (1.676 m).   Weight as of this encounter: 111.5 kg.   - Patient with class II obesity - Encouraged weight loss  GERD without esophagitis - Continue home PPI  History of urinary retention Patient has a chronic Foley in place, seen by urologist and plan for suprapubic catheter placement.  Last exchange was on 09/19/2024. - Outpatient follow-up with urology  Falls PT is recommending short-term rehab.  Hypomagnesemia Magnesium  1.5. - Replace magnesium  and monitor  Hypokalemia Potassium of 3.2 today. - Replace potassium and monitor   Subjective: Patient was seen and examined today.  Feeling weak.  Did had 3-4 bowel movements.  No nausea or vomiting.  Physical Exam: Vitals:   09/24/24 2310 09/25/24 0337 09/25/24  0425 09/25/24 0825  BP: (!) 179/78 131/63  (!) 141/61  Pulse: 67 60  (!) 55  Resp: 18 18  17   Temp: 98.8 F (37.1 C) 99.1 F (37.3 C)  98.9 F (37.2 C)  TempSrc: Oral Oral  Oral  SpO2: 99% 100%  96%  Weight: 113.3 kg  111.5 kg   Height: 5' 6 (1.676 m)      General.  Obese gentleman, in no acute distress. Pulmonary.  Lungs clear bilaterally, normal respiratory effort. CV.  Regular rate and rhythm, no JVD, rub or murmur. Abdomen.  Soft, nontender, nondistended, BS positive. CNS.  Alert and oriented .  No focal neurologic deficit. Extremities.  No edema,  pulses intact and symmetrical. Psychiatry.  Appears to have mild cognitive impairment  Data Reviewed: Prior data reviewed  Family Communication: Discussed with patient  Disposition: Status is: Inpatient Remains inpatient appropriate because: Severity of illness  Planned Discharge Destination: Skilled nursing facility  Time spent: 50 minutes This record has been created using Conservation officer, historic buildings. Errors have been sought and corrected,but may not always be located. Such creation errors do not reflect on the standard of care.   Author: Amaryllis Dare, MD 09/25/2024 2:45 PM  For on call review [email or web address].  "

## 2024-09-25 NOTE — Assessment & Plan Note (Signed)
 Potassium of 3.2 today. - Replace potassium and monitor

## 2024-09-25 NOTE — Evaluation (Signed)
 Physical Therapy Evaluation Patient Details Name: [name] MRN: [id number] DOB: 1951-02-01 Today's Date: 09/25/2024  History of Present Illness  Patient is a 74 year old male presenting after fall. Increased confusion and rising ammonia level, admitted for hepatic encephalopathy. PMH: HTN, HLD, DM2, decompensated hepatic cirrhosis, hepatitis C infection, COPD, CHF  Clinical Impression  Patient is agreeable to PT evaluation. He reports he lives in a group home and has caregiver assistance for ADLs. He is ambulatory short distances without device per his report.  Today the patient reports generalized pain with mobility. He was able to stand and get to the bed side commode with supervision/CGA for safety. He declined walking due to pain and fatigue with mobility. He reports he feels weaker than his baseline. Anticipate patient will need caregiver support for safe return to prior living setting. Consider rehabilitation < 3 hours/day after this hospital stay if patient does not have caregiver support at discharge. PT will continue to follow.       If plan is discharge home, recommend the following: A little help with walking and/or transfers;A little help with bathing/dressing/bathroom;Assistance with cooking/housework;Assistance with feeding;Help with stairs or ramp for entrance;Supervision due to cognitive status;Assist for transportation   Can travel by private vehicle        Equipment Recommendations None recommended by PT  Recommendations for Other Services       Functional Status Assessment Patient has had a recent decline in their functional status and demonstrates the ability to make significant improvements in function in a reasonable and predictable amount of time.     Precautions / Restrictions Precautions Precautions: Fall Recall of Precautions/Restrictions: Impaired Restrictions Weight Bearing Restrictions Per Provider Order: No      Mobility  Bed Mobility Overal  bed mobility: Needs Assistance Bed Mobility: Supine to Sit, Sit to Supine     Supine to sit: Supervision Sit to supine: Min assist   General bed mobility comments: assistance for LE support to return to bed. cues for sequencing    Transfers Overall transfer level: Needs assistance Equipment used: None Transfers: Sit to/from Stand, Bed to chair/wheelchair/BSC Sit to Stand: Supervision   Step pivot transfers: Contact guard assist       General transfer comment: no physical assistance required for transfers.    Ambulation/Gait               General Gait Details: patient refused due to generalized pain with movement  Stairs            Wheelchair Mobility     Tilt Bed    Modified Rankin (Stroke Patients Only)       Balance Overall balance assessment: Needs assistance, History of Falls Sitting-balance support: Feet supported Sitting balance-Leahy Scale: Good     Standing balance support: No upper extremity supported, Single extremity supported Standing balance-Leahy Scale: Fair                               Pertinent Vitals/Pain Pain Assessment Pain Assessment: Faces Faces Pain Scale: Hurts little more Pain Location: neck, back, buttocks Pain Descriptors / Indicators: Discomfort Pain Intervention(s): Limited activity within patient's tolerance, Monitored during session, RN gave pain meds during session    Home Living Family/patient expects to be discharged to:: Group home Living Arrangements: Group Home Available Help at Discharge: Personal care attendant Type of Home: Group Home Home Access: Ramped entrance       Home Layout:  One level Home Equipment: Agricultural Consultant (2 wheels);Rollator (4 wheels);Wheelchair - manual;Grab bars - tub/shower      Prior Function Prior Level of Function : Needs assist;History of Falls (last six months)             Mobility Comments: he reports he is ambulatory without assistive device but has  DME if needed. ADLs Comments: he reports he has assistance at the group home with ADLs, medications, etc. he is a poor historian but reports they help with everything     Extremity/Trunk Assessment   Upper Extremity Assessment Upper Extremity Assessment: Generalized weakness    Lower Extremity Assessment Lower Extremity Assessment: Generalized weakness       Communication   Communication Communication: No apparent difficulties    Cognition Arousal: Alert Behavior During Therapy: WFL for tasks assessed/performed   PT - Cognitive impairments: No family/caregiver present to determine baseline                       PT - Cognition Comments: mild confusion about recent events (reports he was in the chair this morning and nurse confirms that did not happen). cues for initiation, intermittent safety cues needed Following commands: Impaired Following commands impaired: Follows one step commands with increased time     Cueing Cueing Techniques: Verbal cues, Tactile cues     General Comments General comments (skin integrity, edema, etc.): patient declined going to SNF and reports he wants to go home with his HHPT and caregiver support. he does report he feels weaker than his baseline    Exercises     Assessment/Plan    PT Assessment Patient needs continued PT services  PT Problem List Decreased strength;Decreased range of motion;Decreased activity tolerance;Decreased balance;Decreased mobility;Pain;Decreased cognition;Decreased safety awareness       PT Treatment Interventions DME instruction;Gait training;Functional mobility training;Therapeutic activities;Therapeutic exercise;Balance training;Neuromuscular re-education;Patient/family education;Cognitive remediation;Wheelchair mobility training    PT Goals (Current goals can be found in the Care Plan section)  Acute Rehab PT Goals Patient Stated Goal: to get home health PT Goal Formulation: With patient Time For  Goal Achievement: 10/09/24 Potential to Achieve Goals: Good    Frequency Min 2X/week     Co-evaluation               AM-PAC PT 6 Clicks Mobility  Outcome Measure Help needed turning from your back to your side while in a flat bed without using bedrails?: None Help needed moving from lying on your back to sitting on the side of a flat bed without using bedrails?: A Little Help needed moving to and from a bed to a chair (including a wheelchair)?: A Little Help needed standing up from a chair using your arms (e.g., wheelchair or bedside chair)?: A Little Help needed to walk in hospital room?: A Little Help needed climbing 3-5 steps with a railing? : A Lot 6 Click Score: 18    End of Session   Activity Tolerance: Patient limited by fatigue Patient left: in bed;with call bell/phone within reach;with bed alarm set Nurse Communication: Mobility status PT Visit Diagnosis: Muscle weakness (generalized) (M62.81);Unsteadiness on feet (R26.81)    Time: 8967-8895 PT Time Calculation (min) (ACUTE ONLY): 32 min   Charges:   PT Evaluation $PT Eval Low Complexity: 1 Low PT Treatments $Therapeutic Activity: 8-22 mins PT General Charges $$ ACUTE PT VISIT: 1 Visit         Randine Essex, PT, MPT   Randine LULLA Essex 09/25/2024, 1:03 PM

## 2024-09-26 DIAGNOSIS — K7460 Unspecified cirrhosis of liver: Secondary | ICD-10-CM | POA: Diagnosis not present

## 2024-09-26 DIAGNOSIS — I10 Essential (primary) hypertension: Secondary | ICD-10-CM | POA: Diagnosis not present

## 2024-09-26 DIAGNOSIS — E119 Type 2 diabetes mellitus without complications: Secondary | ICD-10-CM | POA: Diagnosis not present

## 2024-09-26 DIAGNOSIS — E669 Obesity, unspecified: Secondary | ICD-10-CM | POA: Diagnosis not present

## 2024-09-26 DIAGNOSIS — K219 Gastro-esophageal reflux disease without esophagitis: Secondary | ICD-10-CM | POA: Diagnosis not present

## 2024-09-26 DIAGNOSIS — K7682 Hepatic encephalopathy: Secondary | ICD-10-CM | POA: Diagnosis not present

## 2024-09-26 DIAGNOSIS — D696 Thrombocytopenia, unspecified: Secondary | ICD-10-CM | POA: Diagnosis not present

## 2024-09-26 DIAGNOSIS — E782 Mixed hyperlipidemia: Secondary | ICD-10-CM | POA: Diagnosis not present

## 2024-09-26 DIAGNOSIS — Z87898 Personal history of other specified conditions: Secondary | ICD-10-CM | POA: Diagnosis not present

## 2024-09-26 DIAGNOSIS — N1831 Chronic kidney disease, stage 3a: Secondary | ICD-10-CM | POA: Diagnosis not present

## 2024-09-26 LAB — GLUCOSE, CAPILLARY
Glucose-Capillary: 123 mg/dL — ABNORMAL HIGH (ref 70–99)
Glucose-Capillary: 107 mg/dL — ABNORMAL HIGH (ref 70–99)
Glucose-Capillary: 114 mg/dL — ABNORMAL HIGH (ref 70–99)
Glucose-Capillary: 139 mg/dL — ABNORMAL HIGH (ref 70–99)

## 2024-09-26 LAB — COMPREHENSIVE METABOLIC PANEL WITH GFR
ALT: 12 U/L (ref 0–44)
AST: 26 U/L (ref 15–41)
Albumin: 2.4 g/dL — ABNORMAL LOW (ref 3.5–5.0)
Alkaline Phosphatase: 65 U/L (ref 38–126)
Anion gap: 4 — ABNORMAL LOW (ref 5–15)
BUN: 14 mg/dL (ref 8–23)
CO2: 28 mmol/L (ref 22–32)
Calcium: 9.2 mg/dL (ref 8.9–10.3)
Chloride: 108 mmol/L (ref 98–111)
Creatinine, Ser: 1.4 mg/dL — ABNORMAL HIGH (ref 0.61–1.24)
GFR, Estimated: 52 mL/min — ABNORMAL LOW
Glucose, Bld: 62 mg/dL — ABNORMAL LOW (ref 70–99)
Potassium: 3.5 mmol/L (ref 3.5–5.1)
Sodium: 140 mmol/L (ref 135–145)
Total Bilirubin: 1.3 mg/dL — ABNORMAL HIGH (ref 0.0–1.2)
Total Protein: 5.9 g/dL — ABNORMAL LOW (ref 6.5–8.1)

## 2024-09-26 LAB — MAGNESIUM: Magnesium: 2.1 mg/dL (ref 1.7–2.4)

## 2024-09-26 NOTE — Plan of Care (Signed)

## 2024-09-26 NOTE — Assessment & Plan Note (Signed)
 Continue home Lasix  and spironolactone  Hydralazine  5 mg IV every 6 hours as needed for SBP greater 165, 5 days ordered

## 2024-09-26 NOTE — Assessment & Plan Note (Signed)
 Hepatic cirrhosis. MELD sodium score 13.  Child-Pugh class B.  Concern of hepatic encephalopathy with falls.  Significantly elevated ammonia and he has stopped taking lactulose .  Ammonia with significant improvement after restarting lactulose .  Patient was educated again about the importance of taking lactulose  and titrating to have 2-3 soft bowel movements daily. Continue home Lasix  and spironolactone  Continue rifaximin , lactulose  30 g 3 times daily

## 2024-09-26 NOTE — Assessment & Plan Note (Signed)
Renal function at baseline -Monitor renal function -Avoid nephrotoxins

## 2024-09-26 NOTE — Progress Notes (Signed)
 " Progress Note   Patient: [name] FMW:[id number] DOB: [birth year] DOA: 09/24/2024     1 DOS: the patient was seen and examined on 09/26/2024   Brief hospital course: Partly taken from H&P.  [name] is a 74 y.o. year old male with medical history of hypertension, hyperlipidemia, type 2 diabetes, decompensated hepatic cirrhosis, hepatitis C infection, COPD, CHF (EF 55% in 06/2024) presenting to the ED after a fall.  Patient states there was water on the floor and he slipped and fell.  Patient lives in a group home and states they provide medications and assist him with his ADLs.  He states he has not taken his lactulose  over the last week.   On presentation hemodynamically stable.  Labs with chronic pancytopenia with some improvement of hemoglobin from previous and slight worsening of thrombocytopenia.  CMP with mild hypoalbuminemia and hyperbilirubinemia.  proBNP mildly elevated at 764 which is close to his baseline.  Ammonia elevated at 144, it seems like slowly uptrending as it was 132 11 days ago when he was in ED. CT head was negative for any acute abnormality. CT trauma scan was negative for any acute injury.  Patient was admitted for concern of developing hepatic encephalopathy with increasing ammonia level and not taking lactulose .  He was restarted on lactulose  with a goal of 3 bowel movements daily.  1/30: Vital stable, ammonia level improved to 57, mild hypokalemia with potassium at 3.2, small improvement in T. bili to 1.3.  CBC with pancytopenia with some decrease in hemoglobin without any active bleeding.  Replacing potassium. PT is recommending short-term rehab.  1/31: Vitals and labs stable.  Mentation at baseline, awaiting SNF placement  Assessment and Plan: * Hepatic encephalopathy (HCC) Hepatic cirrhosis. MELD sodium score 13.  Child-Pugh class B.  Concern of hepatic encephalopathy with falls.  Significantly elevated ammonia and he has stopped taking  lactulose .  Ammonia with significant improvement after restarting lactulose .  Patient was educated again about the importance of taking lactulose  and titrating to have 2-3 soft bowel movements daily. -Continue home Lasix  and spironolactone  -Continue rifaximin   Essential hypertension Blood pressure within goal -Continue home Lasix  and spironolactone   Hyperlipidemia - Holding home Lipitor due to some worsening in liver function which is now started improving. - Monitor liver function and we will restart if continue to improve  Diabetes mellitus type 2, noninsulin dependent (HCC) Intermittent episodes of mild hypoglycemia. - Continue with very sensitive SSI  MDD (major depressive disorder), recurrent severe, without psychosis (HCC) - Continue home Celexa  and Seroquel   Chronic kidney disease, stage 3a (HCC) Renal function at baseline. -Monitor renal function -Avoid nephrotoxins  Thrombocytopenia Seems chronic, likely secondary to liver disease.  Platelets at 72 today -Continue to monitor  COPD (chronic obstructive pulmonary disease) (HCC) No concern of exacerbation at this time. - As needed bronchodilator  Obesity (BMI 30-39.9) Estimated body mass index is 39.68 kg/m as calculated from the following:   Height as of this encounter: 5' 6 (1.676 m).   Weight as of this encounter: 111.5 kg.   - Patient with class II obesity - Encouraged weight loss  GERD without esophagitis - Continue home PPI  History of urinary retention Patient has a chronic Foley in place, seen by urologist and plan for suprapubic catheter placement.  Last exchange was on 09/19/2024. - Outpatient follow-up with urology  Falls PT is recommending short-term rehab.  Hypomagnesemia Magnesium  1.5. - Replace magnesium  and monitor  Hypokalemia Potassium of 3.2 today. -  Replace potassium and monitor   Subjective: Patient was eating breakfast when seen this morning.  No new concern.  Physical  Exam: Vitals:   09/25/24 1952 09/26/24 0302 09/26/24 0306 09/26/24 0803  BP: (!) 134/49  127/65 139/62  Pulse: (!) 51  (!) 51 (!) 47  Resp: 18  18 16   Temp: 99.3 F (37.4 C)  98.4 F (36.9 C) 98.3 F (36.8 C)  TempSrc:      SpO2: 99%  96% 99%  Weight:  109.5 kg    Height:       General.  Obese gentleman, in no acute distress. Pulmonary.  Lungs clear bilaterally, normal respiratory effort. CV.  Regular rate and rhythm, no JVD, rub or murmur. Abdomen.  Soft, nontender, nondistended, BS positive. CNS.  Alert and oriented .  No focal neurologic deficit. Extremities.  No edema,  pulses intact and symmetrical. Psychiatry.  Judgment and insight appears normal.   Data Reviewed: Prior data reviewed  Family Communication: Discussed with patient  Disposition: Status is: Inpatient Remains inpatient appropriate because: Severity of illness  Planned Discharge Destination: Skilled nursing facility  Time spent: 44 minutes This record has been created using Conservation officer, historic buildings. Errors have been sought and corrected,but may not always be located. Such creation errors do not reflect on the standard of care.   Author: Amaryllis Dare, MD 09/26/2024 3:43 PM  For on call review [email or web address].  "

## 2024-09-27 DIAGNOSIS — E119 Type 2 diabetes mellitus without complications: Secondary | ICD-10-CM | POA: Diagnosis not present

## 2024-09-27 DIAGNOSIS — I10 Essential (primary) hypertension: Secondary | ICD-10-CM | POA: Diagnosis not present

## 2024-09-27 DIAGNOSIS — K7460 Unspecified cirrhosis of liver: Secondary | ICD-10-CM | POA: Diagnosis not present

## 2024-09-27 DIAGNOSIS — K7682 Hepatic encephalopathy: Secondary | ICD-10-CM | POA: Diagnosis not present

## 2024-09-27 LAB — GLUCOSE, CAPILLARY
Glucose-Capillary: 100 mg/dL — ABNORMAL HIGH (ref 70–99)
Glucose-Capillary: 98 mg/dL (ref 70–99)
Glucose-Capillary: 113 mg/dL — ABNORMAL HIGH (ref 70–99)
Glucose-Capillary: 110 mg/dL — ABNORMAL HIGH (ref 70–99)

## 2024-09-27 NOTE — Progress Notes (Signed)
 " Progress Note   Patient: [name] FMW:[id number] DOB: 1951/02/26 DOA: 09/24/2024     2 DOS: the patient was seen and examined on 09/27/2024   Brief hospital course: Partly taken from H&P.  [name] is a 74 y.o. year old male with medical history of hypertension, hyperlipidemia, type 2 diabetes, decompensated hepatic cirrhosis, hepatitis C infection, COPD, CHF (EF 55% in 06/2024) presenting to the ED after a fall.  Patient states there was water on the floor and he slipped and fell.  Patient lives in a group home and states they provide medications and assist him with his ADLs.  He states he has not taken his lactulose  over the last week.   On presentation hemodynamically stable.  Labs with chronic pancytopenia with some improvement of hemoglobin from previous and slight worsening of thrombocytopenia.  CMP with mild hypoalbuminemia and hyperbilirubinemia.  proBNP mildly elevated at 764 which is close to his baseline.  Ammonia elevated at 144, it seems like slowly uptrending as it was 132 11 days ago when he was in ED. CT head was negative for any acute abnormality. CT trauma scan was negative for any acute injury.  Patient was admitted for concern of developing hepatic encephalopathy with increasing ammonia level and not taking lactulose .  He was restarted on lactulose  with a goal of 3 bowel movements daily.  1/30: Vital stable, ammonia level improved to 57, mild hypokalemia with potassium at 3.2, small improvement in T. bili to 1.3.  CBC with pancytopenia with some decrease in hemoglobin without any active bleeding.  Replacing potassium. PT is recommending short-term rehab.  1/31: Vitals and labs stable.  Mentation at baseline, awaiting SNF placement.  2/1: Medically stable, awaiting placement.  Assessment and Plan: * Hepatic encephalopathy (HCC) Hepatic cirrhosis. MELD sodium score 13.  Child-Pugh class B.  Concern of hepatic encephalopathy with falls.  Significantly elevated  ammonia and he has stopped taking lactulose .  Ammonia with significant improvement after restarting lactulose .  Patient was educated again about the importance of taking lactulose  and titrating to have 2-3 soft bowel movements daily. -Continue home Lasix  and spironolactone  -Continue rifaximin   Essential hypertension Blood pressure within goal -Continue home Lasix  and spironolactone   Hyperlipidemia - Holding home Lipitor due to some worsening in liver function which is now started improving. - Monitor liver function and we will restart if continue to improve  Diabetes mellitus type 2, noninsulin dependent (HCC) Intermittent episodes of mild hypoglycemia. - Continue with very sensitive SSI  MDD (major depressive disorder), recurrent severe, without psychosis (HCC) - Continue home Celexa  and Seroquel   Chronic kidney disease, stage 3a (HCC) Renal function at baseline. -Monitor renal function -Avoid nephrotoxins  Thrombocytopenia Seems chronic, likely secondary to liver disease.  Platelets at 72 today -Continue to monitor  COPD (chronic obstructive pulmonary disease) (HCC) No concern of exacerbation at this time. - As needed bronchodilator  Obesity (BMI 30-39.9) Estimated body mass index is 39.68 kg/m as calculated from the following:   Height as of this encounter: 5' 6 (1.676 m).   Weight as of this encounter: 111.5 kg.   - Patient with class II obesity - Encouraged weight loss  GERD without esophagitis - Continue home PPI  History of urinary retention Patient has a chronic Foley in place, seen by urologist and plan for suprapubic catheter placement.  Last exchange was on 09/19/2024. - Outpatient follow-up with urology  Falls PT is recommending short-term rehab.  Hypomagnesemia Magnesium  1.5. - Replace magnesium  and monitor  Hypokalemia Potassium of 3.2 today. - Replace potassium and monitor   Subjective: Patient was seen and examined today.  No new  concern.  Physical Exam: Vitals:   09/26/24 1950 09/27/24 0327 09/27/24 0500 09/27/24 1029  BP: (!) 115/50 (!) 121/52  (!) 127/53  Pulse: (!) 53 (!) 55  (!) 50  Resp: 16 16  16   Temp: 98.3 F (36.8 C) 98.4 F (36.9 C)  98.6 F (37 C)  TempSrc: Oral Oral    SpO2: 98% 98%  97%  Weight:   109 kg   Height:       General.  Obese gentleman, in no acute distress. Pulmonary.  Lungs [name] bilaterally, normal respiratory effort. CV.  Regular rate and rhythm, no JVD, rub or murmur. Abdomen.  Soft, nontender, nondistended, BS positive. CNS.  Alert and oriented .  No focal neurologic deficit. Extremities.  No edema,  pulses intact and symmetrical.  Data Reviewed: Prior data reviewed  Family Communication: Discussed with patient  Disposition: Status is: Inpatient Remains inpatient appropriate because: Severity of illness  Planned Discharge Destination: Skilled nursing facility  Time spent: 42 minutes This record has been created using Conservation officer, historic buildings. Errors have been sought and corrected,but may not always be located. Such creation errors do not reflect on the standard of care.   Author: Amaryllis Dare, MD 09/27/2024 3:27 PM  For on call review [email or web address].  "

## 2024-09-27 NOTE — Plan of Care (Signed)

## 2024-09-28 ENCOUNTER — Encounter: Payer: Self-pay | Admitting: Internal Medicine

## 2024-09-28 LAB — GLUCOSE, CAPILLARY
Glucose-Capillary: 76 mg/dL (ref 70–99)
Glucose-Capillary: 103 mg/dL — ABNORMAL HIGH (ref 70–99)
Glucose-Capillary: 105 mg/dL — ABNORMAL HIGH (ref 70–99)

## 2024-09-28 MED ORDER — HYDRALAZINE HCL 20 MG/ML IJ SOLN: 5.0000 mg | Freq: Four times a day (QID) | INTRAMUSCULAR | Status: DC | PRN

## 2024-09-28 MED ORDER — HEPARIN SODIUM (PORCINE) 5000 UNIT/ML IJ SOLN
5000.0000 [IU] | Freq: Three times a day (TID) | INTRAMUSCULAR | Status: DC
  Administered 2024-09-29: 5000 [IU] via SUBCUTANEOUS

## 2024-09-28 MED ORDER — ATORVASTATIN CALCIUM 20 MG PO TABS
40.0000 mg | ORAL_TABLET | Freq: Every day | ORAL | Status: DC
  Administered 2024-09-28: 40 mg via ORAL
  Filled 2024-09-28: qty 2

## 2024-09-28 NOTE — Assessment & Plan Note (Signed)
 PT is recommending short-term rehab Fall precaution

## 2024-09-28 NOTE — Progress Notes (Signed)
 Mobility Specialist Progress Note:    09/28/24 0937  Mobility  Activity Pivoted/transferred to/from [facility]  Level of Assistance Contact guard assist, steadying assist  Assistive Device None (HHA)  Distance Ambulated (ft) 4 ft  Range of Motion/Exercises Active;All extremities  Activity Response Tolerated well  Mobility visit 1 Mobility  Mobility Specialist Start Time (ACUTE ONLY) [phone]  Mobility Specialist Stop Time (ACUTE ONLY) 0936  Mobility Specialist Time Calculation (min) (ACUTE ONLY) 9 min   MS responding to bed alarm, pt required CGA to stand and transfer with hand held assist. Tolerated well, asx throughout. Returned pt to bed, NT at bedside for peri care. All needs met.  Sherrilee Ditty Mobility Specialist Please contact via Special Educational Needs Teacher or  Rehab office at [phone]

## 2024-09-28 NOTE — Assessment & Plan Note (Signed)
 Continue home citalopram  20 mg daily and quetiapine  50 mg nightly

## 2024-09-28 NOTE — Assessment & Plan Note (Signed)
 Holding home Lipitor due to some worsening in liver function which is now started improving. Monitor liver function and we will restart if continue to improve  2/2-liver function has improved.  Home atorvastatin  40 mg nightly resumed.  Recheck CMP in the a.m.

## 2024-09-28 NOTE — TOC Progression Note (Signed)
 Transition of Care [facility]) - Progression Note    Patient Details  Name: [name] MRN: [id number] Date of Birth: Jan 03, 1951  [facility]) CM/SW Contact  [name], RN Phone Number: 09/28/2024, 3:47 PM  Clinical Narrative:      No bed offers received due to patient being on the sex offender registry Search extended MD notified that patient would likely have to be optimized while in the hospital in order to return to the care home                    Expected Discharge Plan and Services         Expected Discharge Date: 09/28/24                                     Social Drivers of Health (SDOH) Interventions SDOH Screenings   Food Insecurity: No Food Insecurity (09/25/2024)  Housing: Low Risk (09/25/2024)  Transportation Needs: No Transportation Needs (09/25/2024)  Utilities: At Risk (09/25/2024)  Alcohol Screen: Low Risk (09/24/2022)  Depression (PHQ2-9): High Risk (04/26/2022)  Financial Resource Strain: High Risk (04/27/2022)  Social Connections: Socially Isolated (09/25/2024)  Tobacco Use: High Risk (09/28/2024)    Readmission Risk Interventions    08/17/2024    7:19 PM 11/29/2023    5:03 PM 11/13/2023    2:21 PM  Readmission Risk Prevention Plan  Transportation Screening Complete Not Complete   Medication Review (RN Care Manager) Complete Not Complete Complete  PCP or Specialist appointment within 3-5 days of discharge  Not Complete Complete  HRI or Home Care Consult Complete Not Complete Complete  SW Recovery Care/Counseling Consult  Not Complete Complete  Palliative Care Screening Not Applicable Not Applicable Complete  Skilled Nursing Facility Not Applicable Not Applicable Not Applicable

## 2024-09-28 NOTE — Assessment & Plan Note (Addendum)
 Seems chronic, likely secondary to liver disease.  Platelets at 72 today 2/2- check CBC in the a.m. 2/3 - cbc reviewed and is chronic and stable

## 2024-09-28 NOTE — Assessment & Plan Note (Addendum)
 Estimated body mass index is 35.9 kg/m as calculated from the following:   Height as of this encounter: 5' 6 (1.676 m).   Weight as of this encounter: 100.9 kg.   Patient meets criteria for morbid obesity This complicates overall care and prognosis.

## 2024-09-28 NOTE — Assessment & Plan Note (Signed)
 Continue home PPI

## 2024-09-28 NOTE — Plan of Care (Signed)

## 2024-09-28 NOTE — TOC Progression Note (Signed)
 Transition of Care [facility]) - Progression Note    Patient Details  Name: [name] MRN: [id number] Date of Birth: 1951-01-01  [facility] of [facility]) CM/SW Contact  [name], RN Phone Number: 09/28/2024, 9:29 AM  Clinical Narrative:        Care without delay order noted for discharge.  Patient does not currently have a confirmed disposition.  CM to continue to progress case                  Expected Discharge Plan and Services         Expected Discharge Date: 09/27/24                                     Social Drivers of Health (SDOH) Interventions SDOH Screenings   Food Insecurity: No Food Insecurity (09/25/2024)  Housing: Low Risk (09/25/2024)  Transportation Needs: No Transportation Needs (09/25/2024)  Utilities: At Risk (09/25/2024)  Alcohol Screen: Low Risk (09/24/2022)  Depression (PHQ2-9): High Risk (04/26/2022)  Financial Resource Strain: High Risk (04/27/2022)  Social Connections: Socially Isolated (09/25/2024)  Tobacco Use: High Risk (09/28/2024)    Readmission Risk Interventions    08/17/2024    7:19 PM 11/29/2023    5:03 PM 11/13/2023    2:21 PM  Readmission Risk Prevention Plan  Transportation Screening Complete Not Complete   Medication Review (RN Care Manager) Complete Not Complete Complete  PCP or Specialist appointment within 3-5 days of discharge  Not Complete Complete  HRI or Home Care Consult Complete Not Complete Complete  SW Recovery Care/Counseling Consult  Not Complete Complete  Palliative Care Screening Not Applicable Not Applicable Complete  Skilled Nursing Facility Not Applicable Not Applicable Not Applicable

## 2024-09-28 NOTE — Assessment & Plan Note (Signed)
 Patient has a chronic Foley in place, seen by urologist and plan for suprapubic catheter placement.  Last exchange was on 09/19/2024. Outpatient follow-up with urology

## 2024-09-29 ENCOUNTER — Other Ambulatory Visit: Payer: Self-pay

## 2024-09-29 LAB — CBC
HCT: 25.4 % — ABNORMAL LOW (ref 39.0–52.0)
Hemoglobin: 8.2 g/dL — ABNORMAL LOW (ref 13.0–17.0)
MCH: 29.7 pg (ref 26.0–34.0)
MCHC: 32.3 g/dL (ref 30.0–36.0)
MCV: 92 fL (ref 80.0–100.0)
Platelets: 75 10*3/uL — ABNORMAL LOW (ref 150–400)
RBC: 2.76 MIL/uL — ABNORMAL LOW (ref 4.22–5.81)
RDW: 15.9 % — ABNORMAL HIGH (ref 11.5–15.5)
WBC: 2.8 10*3/uL — ABNORMAL LOW (ref 4.0–10.5)
nRBC: 0 % (ref 0.0–0.2)

## 2024-09-29 LAB — COMPREHENSIVE METABOLIC PANEL WITH GFR
ALT: 11 U/L (ref 0–44)
AST: 27 U/L (ref 15–41)
Albumin: 2.4 g/dL — ABNORMAL LOW (ref 3.5–5.0)
Alkaline Phosphatase: 64 U/L (ref 38–126)
Anion gap: 5 (ref 5–15)
BUN: 22 mg/dL (ref 8–23)
CO2: 27 mmol/L (ref 22–32)
Calcium: 9.6 mg/dL (ref 8.9–10.3)
Chloride: 106 mmol/L (ref 98–111)
Creatinine, Ser: 1.33 mg/dL — ABNORMAL HIGH (ref 0.61–1.24)
GFR, Estimated: 56 mL/min — ABNORMAL LOW
Glucose, Bld: 108 mg/dL — ABNORMAL HIGH (ref 70–99)
Potassium: 3.7 mmol/L (ref 3.5–5.1)
Sodium: 138 mmol/L (ref 135–145)
Total Bilirubin: 1.2 mg/dL (ref 0.0–1.2)
Total Protein: 5.9 g/dL — ABNORMAL LOW (ref 6.5–8.1)

## 2024-09-29 LAB — GLUCOSE, CAPILLARY
Glucose-Capillary: 72 mg/dL (ref 70–99)
Glucose-Capillary: 85 mg/dL (ref 70–99)

## 2024-09-29 MED ORDER — ONDANSETRON HCL 4 MG PO TABS
4.0000 mg | ORAL_TABLET | Freq: Four times a day (QID) | ORAL | 0 refills | Status: AC | PRN
  Filled 2024-09-29: qty 20, 5d supply, fill #0

## 2024-09-29 NOTE — Progress Notes (Signed)
 Occupational Therapy Treatment Patient Details Name: [name] MRN: [id number] DOB: 06/06/1951 Today's Date: 09/29/2024   History of present illness Patient is a 74 year old male presenting after fall. Increased confusion and rising ammonia level, admitted for hepatic encephalopathy. PMH: HTN, HLD, DM2, decompensated hepatic cirrhosis, hepatitis C infection, COPD, CHF   OT comments  Pt is supine in bed on arrival. Pleasant and agreeable to OT session. He denies pain. Pt performed bed mobility with independence. He engaged in ADL session for UB/LB dressing/bathing tasks and demo all tasks in standing with unilateral support on sink with SBA, as well as donning bil socks with set up assist seated on EOB. No LOB throughout session and pt reports this is the level of assist he receives at home.He performed additional functional mobility bout of 20 ft using RW + supervision/SBA.  Pt returned to bed with all needs in place and will cont to require skilled acute OT services to maximize his safety and IND to return to PLOF.       If plan is discharge home, recommend the following:  A little help with walking and/or transfers;Supervision due to cognitive status;A little help with bathing/dressing/bathroom   Equipment Recommendations  None recommended by OT    Recommendations for Other Services      Precautions / Restrictions Precautions Precautions: Fall Recall of Precautions/Restrictions: Impaired Restrictions Weight Bearing Restrictions Per Provider Order: No       Mobility Bed Mobility Overal bed mobility: Independent                  Transfers Overall transfer level: Needs assistance Equipment used: Rolling walker (2 wheels) Transfers: Sit to/from Stand Sit to Stand: Supervision           General transfer comment: no LOB during session, able to stand with unilateral support on sink for bathing tasks     Balance Overall balance assessment: Needs assistance,  History of Falls Sitting-balance support: Feet supported Sitting balance-Leahy Scale: Normal     Standing balance support: During functional activity, Single extremity supported Standing balance-Leahy Scale: Good                             ADL either performed or assessed with clinical judgement   ADL Overall ADL's : Needs assistance/impaired     Grooming: Standing;Supervision/safety   Upper Body Bathing: Supervision/ safety;Standing   Lower Body Bathing: Supervison/ safety;Sit to/from stand   Upper Body Dressing : Supervision/safety;Standing   Lower Body Dressing: Supervision/safety;Sitting/lateral leans Lower Body Dressing Details (indicate cue type and reason): donn socks             Functional mobility during ADLs: Rolling walker (2 wheels);Supervision/safety      Extremity/Trunk Assessment              Occupational Psychologist Communication: No apparent difficulties   Cognition Arousal: Alert Behavior During Therapy: WFL for tasks assessed/performed                                 Following commands: Intact        Cueing   Cueing Techniques: Verbal cues, Tactile cues  Exercises      Shoulder Instructions       General Comments      Pertinent  Vitals/ Pain       Pain Assessment Pain Assessment: No/denies pain  Home Living                                          Prior Functioning/Environment              Frequency  Min 2X/week        Progress Toward Goals  OT Goals(current goals can now be found in the care plan section)  Progress towards OT goals: Progressing toward goals  Acute Rehab OT Goals Patient Stated Goal: return to home OT Goal Formulation: With patient Time For Goal Achievement: 10/09/24 Potential to Achieve Goals: Fair  Plan      Co-evaluation                 AM-PAC OT 6 Clicks Daily Activity      Outcome Measure   Help from another person eating meals?: None Help from another person taking care of personal grooming?: None Help from another person toileting, which includes using toliet, bedpan, or urinal?: A Little Help from another person bathing (including washing, rinsing, drying)?: A Little Help from another person to put on and taking off regular upper body clothing?: None Help from another person to put on and taking off regular lower body clothing?: A Little 6 Click Score: 21    End of Session Equipment Utilized During Treatment: Rolling walker (2 wheels)  OT Visit Diagnosis: Other abnormalities of gait and mobility (R26.89);Muscle weakness (generalized) (M62.81)   Activity Tolerance Patient tolerated treatment well   Patient Left in bed;with call bell/phone within reach;with bed alarm set   Nurse Communication Mobility status        Time: 8948-8888 OT Time Calculation (min): 20 min  Charges: OT General Charges $OT Visit: 1 Visit OT Treatments $Self Care/Home Management : 8-22 mins  [name], OTR/L  09/29/24, 12:49 PM   [name] 09/29/2024, 12:46 PM

## 2024-09-29 NOTE — Progress Notes (Signed)
 Physical Therapy Treatment Patient Details Name: [name] MRN: [id number] DOB: November 13, 1950 Today's Date: 09/29/2024   History of Present Illness Patient is a 74 year old male presenting after fall. Increased confusion and rising ammonia level, admitted for hepatic encephalopathy. PMH: HTN, HLD, DM2, decompensated hepatic cirrhosis, hepatitis C infection, COPD, CHF    PT Comments  Pt was pleasant and motivated to participate during the session and put forth good effort throughout. Pt made excellent progress towards goals this session requiring no physical assistance with functional tasks and demonstrating good control and stability with standing activities.  Pt able to come to standing without UE assist with very good speed and then able to amb 200+ feet with a RW with pt reporting the effort of ambulation as easy.  Pt reported no adverse symptoms during the session with SpO2 and HR WNL throughout on room air.  Pt will benefit from continued PT services upon discharge to safely address deficits listed in patient problem list for decreased caregiver assistance and eventual return to PLOF.       If plan is discharge home, recommend the following: A little help with walking and/or transfers;A little help with bathing/dressing/bathroom;Assistance with cooking/housework;Help with stairs or ramp for entrance;Supervision due to cognitive status;Assist for transportation   Can travel by private vehicle        Equipment Recommendations  None recommended by PT    Recommendations for Other Services       Precautions / Restrictions Precautions Precautions: Fall Recall of Precautions/Restrictions: Impaired Restrictions Weight Bearing Restrictions Per Provider Order: No     Mobility  Bed Mobility Overal bed mobility: Independent             General bed mobility comments: Pt able to perform sup to/from sit with good speed and control without the need of bed rails     Transfers Overall transfer level: Needs assistance Equipment used: Rolling walker (2 wheels) Transfers: Sit to/from Stand Sit to Stand: Supervision           General transfer comment: Very good eccentric and concentric control and stability with pt able to come to standing with good speed and power without the use of his UEs to assist.    Ambulation/Gait Ambulation/Gait assistance: Supervision Gait Distance (Feet): 200 Feet Assistive device: Rolling walker (2 wheels) Gait Pattern/deviations: Step-through pattern, Decreased step length - right, Decreased step length - left Gait velocity: min decreased     General Gait Details: Mildly reduced cadence and step length but steady throughout including during start/stops and 180 deg turns   Optometrist     Tilt Bed    Modified Rankin (Stroke Patients Only)       Balance Overall balance assessment: Needs assistance, History of Falls Sitting-balance support: Feet supported Sitting balance-Leahy Scale: Normal     Standing balance support: Bilateral upper extremity supported, During functional activity, Reliant on assistive device for balance Standing balance-Leahy Scale: Good                              Communication Communication Communication: No apparent difficulties  Cognition Arousal: Alert Behavior During Therapy: WFL for tasks assessed/performed   PT - Cognitive impairments: No family/caregiver present to determine baseline  Following commands: Intact      Cueing Cueing Techniques: Verbal cues, Tactile cues  Exercises Total Joint Exercises Long Arc Quad: AROM, Strengthening, Both, 10 reps Knee Flexion: AROM, Strengthening, Both, 10 reps Marching in Standing: Strengthening, Both, 5 reps, Standing    General Comments        Pertinent Vitals/Pain Pain Assessment Pain Assessment: No/denies pain    Home Living                           Prior Function            PT Goals (current goals can now be found in the care plan section) Progress towards PT goals: Progressing toward goals    Frequency    Min 2X/week      PT Plan      Co-evaluation              AM-PAC PT 6 Clicks Mobility   Outcome Measure  Help needed turning from your back to your side while in a flat bed without using bedrails?: None Help needed moving from lying on your back to sitting on the side of a flat bed without using bedrails?: None Help needed moving to and from a bed to a chair (including a wheelchair)?: A Little Help needed standing up from a chair using your arms (e.g., wheelchair or bedside chair)?: A Little Help needed to walk in hospital room?: A Little Help needed climbing 3-5 steps with a railing? : A Little 6 Click Score: 20    End of Session Equipment Utilized During Treatment: Gait belt Activity Tolerance: Patient tolerated treatment well Patient left: in bed;with call bell/phone within reach;with bed alarm set Nurse Communication: Mobility status PT Visit Diagnosis: Muscle weakness (generalized) (M62.81);Unsteadiness on feet (R26.81)     Time: 1001-1025 PT Time Calculation (min) (ACUTE ONLY): 24 min  Charges:    $Gait Training: 8-22 mins $Therapeutic Exercise: 8-22 mins PT General Charges $$ ACUTE PT VISIT: 1 Visit                     D. Scott Chavela Justiniano PT, DPT 09/29/24, 10:57 AM

## 2024-09-29 NOTE — Progress Notes (Signed)
 Patient report and discharge paperwork given to patient and Mr. [name]. Patient belongings placed in patient belonging bag and verified by patient that all belongings were accounted for. Patient discharged.

## 2024-09-29 NOTE — TOC Transition Note (Signed)
 Transition of Care [facility]) - Discharge Note   Patient Details  Name: [name] MRN: [id number] Date of Birth: 1951-08-07  Transition of Care [facility]) CM/SW Contact:  [name], RN Phone Number: 09/29/2024, 3:05 PM   Clinical Narrative:      Patient to dc today Patient has progressed with therapy to return to care home with resumption of home health services Patient in agreement with plan DC summary and Fl2 placed in DC packet Bedside RN provided number to call report [facility] care home owner to arrange transportation        Patient Goals and CMS Choice            Discharge Placement                       Discharge Plan and Services Additional resources added to the After Visit Summary for                                       Social Drivers of Health (SDOH) Interventions SDOH Screenings   Food Insecurity: No Food Insecurity (09/25/2024)  Housing: Low Risk (09/25/2024)  Transportation Needs: No Transportation Needs (09/25/2024)  Utilities: At Risk (09/25/2024)  Alcohol Screen: Low Risk (09/24/2022)  Depression (PHQ2-9): High Risk (04/26/2022)  Financial Resource Strain: High Risk (04/27/2022)  Social Connections: Socially Isolated (09/25/2024)  Tobacco Use: High Risk (09/28/2024)     Readmission Risk Interventions    08/17/2024    7:19 PM 11/29/2023    5:03 PM 11/13/2023    2:21 PM  Readmission Risk Prevention Plan  Transportation Screening Complete Not Complete   Medication Review (RN Care Manager) Complete Not Complete Complete  PCP or Specialist appointment within 3-5 days of discharge  Not Complete Complete  HRI or Home Care Consult Complete Not Complete Complete  SW Recovery Care/Counseling Consult  Not Complete Complete  Palliative Care Screening Not Applicable Not Applicable Complete  Skilled Nursing Facility Not Applicable Not Applicable Not Applicable

## 2024-09-29 NOTE — Care Management Important Message (Signed)
 Important Message  Patient Details  Name: [name] MRN: [id number] Date of Birth: 05/02/1951   Important Message Given:  Yes - Medicare IM     Candido Flott 09/29/2024, 1:52 PM

## 2024-09-29 NOTE — Plan of Care (Signed)

## 2024-09-29 NOTE — Progress Notes (Signed)
 RN attempted to call [name] at [phone] to give patient report. No answer. RN will attempt to call report again prior to patient discharge.

## 2024-10-16 ENCOUNTER — Ambulatory Visit: Admitting: Urology

## 2025-01-26 ENCOUNTER — Ambulatory Visit: Admitting: Family
